# Patient Record
Sex: Female | Born: 1935 | Race: White | Hispanic: No | Smoking: Former smoker
Health system: Southern US, Community
[De-identification: ages and names within clinical notes are randomized; demographics above are authoritative.]

## PROBLEM LIST (undated history)

## (undated) ENCOUNTER — Emergency Department (HOSPITAL_COMMUNITY): Admission: EM | Disposition: A | Discharge status: Home / Self Care | Payer: Medicare Other

## (undated) DIAGNOSIS — R42 Dizziness and giddiness: Secondary | ICD-10-CM

## (undated) DIAGNOSIS — Z860100 Personal history of colon polyps, unspecified: Secondary | ICD-10-CM

## (undated) DIAGNOSIS — I447 Left bundle-branch block, unspecified: Secondary | ICD-10-CM

## (undated) DIAGNOSIS — I739 Peripheral vascular disease, unspecified: Secondary | ICD-10-CM

## (undated) DIAGNOSIS — U071 COVID-19: Secondary | ICD-10-CM

## (undated) DIAGNOSIS — Z95810 Presence of automatic (implantable) cardiac defibrillator: Secondary | ICD-10-CM

## (undated) DIAGNOSIS — I771 Stricture of artery: Secondary | ICD-10-CM

## (undated) DIAGNOSIS — Z95820 Peripheral vascular angioplasty status with implants and grafts: Secondary | ICD-10-CM

## (undated) DIAGNOSIS — F329 Major depressive disorder, single episode, unspecified: Secondary | ICD-10-CM

## (undated) DIAGNOSIS — J40 Bronchitis, not specified as acute or chronic: Secondary | ICD-10-CM

## (undated) DIAGNOSIS — K219 Gastro-esophageal reflux disease without esophagitis: Secondary | ICD-10-CM

## (undated) DIAGNOSIS — J449 Chronic obstructive pulmonary disease, unspecified: Secondary | ICD-10-CM

## (undated) DIAGNOSIS — I219 Acute myocardial infarction, unspecified: Secondary | ICD-10-CM

## (undated) DIAGNOSIS — F0390 Unspecified dementia without behavioral disturbance: Secondary | ICD-10-CM

## (undated) DIAGNOSIS — Z87442 Personal history of urinary calculi: Secondary | ICD-10-CM

## (undated) DIAGNOSIS — E785 Hyperlipidemia, unspecified: Secondary | ICD-10-CM

## (undated) DIAGNOSIS — G4700 Insomnia, unspecified: Secondary | ICD-10-CM

## (undated) DIAGNOSIS — I5042 Chronic combined systolic (congestive) and diastolic (congestive) heart failure: Secondary | ICD-10-CM

## (undated) DIAGNOSIS — I10 Essential (primary) hypertension: Secondary | ICD-10-CM

## (undated) DIAGNOSIS — N183 Chronic kidney disease, stage 3 unspecified: Secondary | ICD-10-CM

## (undated) DIAGNOSIS — M797 Fibromyalgia: Secondary | ICD-10-CM

## (undated) DIAGNOSIS — M1990 Unspecified osteoarthritis, unspecified site: Secondary | ICD-10-CM

## (undated) DIAGNOSIS — F419 Anxiety disorder, unspecified: Secondary | ICD-10-CM

## (undated) DIAGNOSIS — K635 Polyp of colon: Secondary | ICD-10-CM

## (undated) DIAGNOSIS — G8929 Other chronic pain: Secondary | ICD-10-CM

## (undated) DIAGNOSIS — E119 Type 2 diabetes mellitus without complications: Secondary | ICD-10-CM

## (undated) DIAGNOSIS — H269 Unspecified cataract: Secondary | ICD-10-CM

## (undated) DIAGNOSIS — N1832 Chronic kidney disease, stage 3b: Secondary | ICD-10-CM

## (undated) DIAGNOSIS — F418 Other specified anxiety disorders: Secondary | ICD-10-CM

## (undated) DIAGNOSIS — J9621 Acute and chronic respiratory failure with hypoxia: Secondary | ICD-10-CM

## (undated) DIAGNOSIS — N179 Acute kidney failure, unspecified: Secondary | ICD-10-CM

## (undated) DIAGNOSIS — R0600 Dyspnea, unspecified: Secondary | ICD-10-CM

## (undated) DIAGNOSIS — J45909 Unspecified asthma, uncomplicated: Secondary | ICD-10-CM

## (undated) DIAGNOSIS — R55 Syncope and collapse: Secondary | ICD-10-CM

## (undated) DIAGNOSIS — I429 Cardiomyopathy, unspecified: Secondary | ICD-10-CM

## (undated) DIAGNOSIS — I272 Pulmonary hypertension, unspecified: Secondary | ICD-10-CM

## (undated) DIAGNOSIS — K5909 Other constipation: Secondary | ICD-10-CM

## (undated) DIAGNOSIS — Z950 Presence of cardiac pacemaker: Secondary | ICD-10-CM

## (undated) DIAGNOSIS — M549 Dorsalgia, unspecified: Secondary | ICD-10-CM

## (undated) DIAGNOSIS — I2510 Atherosclerotic heart disease of native coronary artery without angina pectoris: Secondary | ICD-10-CM

## (undated) DIAGNOSIS — H9190 Unspecified hearing loss, unspecified ear: Secondary | ICD-10-CM

## (undated) HISTORY — PX: INSERT / REPLACE / REMOVE PACEMAKER: SUR710

## (undated) HISTORY — PX: CORONARY ANGIOPLASTY: SHX604

## (undated) HISTORY — PX: TUBAL LIGATION: SHX77

## (undated) HISTORY — DX: Acute myocardial infarction, unspecified: I21.9

## (undated) HISTORY — DX: Fibromyalgia: M79.7

## (undated) HISTORY — DX: Peripheral vascular disease, unspecified: I73.9

## (undated) HISTORY — DX: Acute kidney failure, unspecified: N17.9

## (undated) HISTORY — DX: Hyperlipidemia, unspecified: E78.5

## (undated) HISTORY — PX: ABDOMINAL HYSTERECTOMY: SHX81

## (undated) HISTORY — DX: Gastro-esophageal reflux disease without esophagitis: K21.9

## (undated) HISTORY — DX: Syncope and collapse: R55

## (undated) HISTORY — PX: TONSILLECTOMY: SUR1361

## (undated) HISTORY — DX: Personal history of colon polyps, unspecified: Z86.0100

## (undated) HISTORY — DX: Chronic obstructive pulmonary disease, unspecified: J44.9

## (undated) HISTORY — DX: Atherosclerotic heart disease of native coronary artery without angina pectoris: I25.10

## (undated) HISTORY — DX: Major depressive disorder, single episode, unspecified: F32.9

## (undated) HISTORY — DX: Essential (primary) hypertension: I10

## (undated) HISTORY — DX: Left bundle-branch block, unspecified: I44.7

## (undated) HISTORY — DX: Insomnia, unspecified: G47.00

## (undated) HISTORY — PX: APPENDECTOMY: SHX54

## (undated) HISTORY — DX: Personal history of colonic polyps: Z86.010

---

## 1997-11-19 ENCOUNTER — Emergency Department (HOSPITAL_COMMUNITY): Admission: EM | Admit: 1997-11-19 | Discharge: 1997-11-19 | Payer: Self-pay | Admitting: Emergency Medicine

## 1998-02-12 ENCOUNTER — Ambulatory Visit (HOSPITAL_COMMUNITY): Admission: RE | Admit: 1998-02-12 | Discharge: 1998-02-12 | Payer: Self-pay | Admitting: *Deleted

## 1999-02-12 ENCOUNTER — Other Ambulatory Visit: Admission: RE | Admit: 1999-02-12 | Discharge: 1999-02-12 | Payer: Self-pay | Admitting: *Deleted

## 2000-04-11 ENCOUNTER — Emergency Department (HOSPITAL_COMMUNITY): Admission: EM | Admit: 2000-04-11 | Discharge: 2000-04-11 | Payer: Self-pay | Admitting: Emergency Medicine

## 2000-06-15 ENCOUNTER — Encounter: Admission: RE | Admit: 2000-06-15 | Discharge: 2000-06-15 | Payer: Self-pay | Admitting: *Deleted

## 2000-11-21 ENCOUNTER — Encounter: Payer: Self-pay | Admitting: Neurosurgery

## 2000-11-21 ENCOUNTER — Ambulatory Visit (HOSPITAL_COMMUNITY): Admission: RE | Admit: 2000-11-21 | Discharge: 2000-11-21 | Payer: Self-pay | Admitting: Neurosurgery

## 2002-01-10 ENCOUNTER — Encounter: Admission: RE | Admit: 2002-01-10 | Discharge: 2002-01-10 | Payer: Self-pay | Admitting: *Deleted

## 2002-02-17 ENCOUNTER — Encounter (INDEPENDENT_AMBULATORY_CARE_PROVIDER_SITE_OTHER): Payer: Self-pay | Admitting: Specialist

## 2002-02-17 ENCOUNTER — Encounter (INDEPENDENT_AMBULATORY_CARE_PROVIDER_SITE_OTHER): Payer: Self-pay | Admitting: *Deleted

## 2002-02-17 ENCOUNTER — Ambulatory Visit (HOSPITAL_COMMUNITY): Admission: RE | Admit: 2002-02-17 | Discharge: 2002-02-17 | Payer: Self-pay | Admitting: *Deleted

## 2002-07-19 ENCOUNTER — Emergency Department (HOSPITAL_COMMUNITY): Admission: EM | Admit: 2002-07-19 | Discharge: 2002-07-19 | Payer: Self-pay | Admitting: Emergency Medicine

## 2002-07-19 ENCOUNTER — Encounter: Payer: Self-pay | Admitting: Emergency Medicine

## 2002-07-29 ENCOUNTER — Encounter: Payer: Self-pay | Admitting: Urology

## 2002-07-29 ENCOUNTER — Encounter: Admission: RE | Admit: 2002-07-29 | Discharge: 2002-07-29 | Payer: Self-pay | Admitting: Urology

## 2002-08-01 ENCOUNTER — Encounter: Payer: Self-pay | Admitting: Urology

## 2002-08-01 ENCOUNTER — Ambulatory Visit (HOSPITAL_BASED_OUTPATIENT_CLINIC_OR_DEPARTMENT_OTHER): Admission: RE | Admit: 2002-08-01 | Discharge: 2002-08-01 | Payer: Self-pay | Admitting: Urology

## 2002-08-18 ENCOUNTER — Encounter: Admission: RE | Admit: 2002-08-18 | Discharge: 2002-08-18 | Payer: Self-pay | Admitting: Family Medicine

## 2002-08-18 ENCOUNTER — Encounter: Payer: Self-pay | Admitting: Family Medicine

## 2003-06-01 ENCOUNTER — Encounter: Admission: RE | Admit: 2003-06-01 | Discharge: 2003-06-01 | Payer: Self-pay | Admitting: Family Medicine

## 2003-11-01 ENCOUNTER — Emergency Department (HOSPITAL_COMMUNITY): Admission: EM | Admit: 2003-11-01 | Discharge: 2003-11-02 | Payer: Self-pay | Admitting: *Deleted

## 2003-11-02 ENCOUNTER — Encounter: Admission: RE | Admit: 2003-11-02 | Discharge: 2003-11-02 | Payer: Self-pay | Admitting: Family Medicine

## 2004-05-15 ENCOUNTER — Encounter: Admission: RE | Admit: 2004-05-15 | Discharge: 2004-05-15 | Payer: Self-pay | Admitting: Cardiovascular Disease

## 2004-05-17 ENCOUNTER — Ambulatory Visit: Payer: Self-pay | Admitting: Family Medicine

## 2004-05-20 ENCOUNTER — Ambulatory Visit (HOSPITAL_COMMUNITY): Admission: RE | Admit: 2004-05-20 | Discharge: 2004-05-20 | Payer: Self-pay | Admitting: Cardiovascular Disease

## 2004-07-24 ENCOUNTER — Ambulatory Visit: Payer: Self-pay | Admitting: Family Medicine

## 2004-08-13 ENCOUNTER — Ambulatory Visit: Payer: Self-pay | Admitting: Family Medicine

## 2004-10-01 ENCOUNTER — Ambulatory Visit: Payer: Self-pay | Admitting: Family Medicine

## 2005-07-10 ENCOUNTER — Ambulatory Visit: Payer: Self-pay | Admitting: Family Medicine

## 2005-09-08 ENCOUNTER — Ambulatory Visit: Payer: Self-pay | Admitting: Family Medicine

## 2005-09-11 ENCOUNTER — Ambulatory Visit: Payer: Self-pay

## 2006-02-11 ENCOUNTER — Ambulatory Visit: Payer: Self-pay | Admitting: Family Medicine

## 2006-09-09 ENCOUNTER — Ambulatory Visit: Payer: Self-pay | Admitting: Family Medicine

## 2006-10-20 ENCOUNTER — Ambulatory Visit: Payer: Self-pay | Admitting: Family Medicine

## 2006-12-09 ENCOUNTER — Ambulatory Visit: Payer: Self-pay | Admitting: Family Medicine

## 2006-12-30 ENCOUNTER — Ambulatory Visit: Payer: Self-pay | Admitting: Internal Medicine

## 2007-01-13 ENCOUNTER — Telehealth: Payer: Self-pay | Admitting: Family Medicine

## 2007-01-15 ENCOUNTER — Ambulatory Visit: Payer: Self-pay | Admitting: Internal Medicine

## 2007-02-01 ENCOUNTER — Telehealth: Payer: Self-pay | Admitting: Family Medicine

## 2007-02-10 ENCOUNTER — Ambulatory Visit: Payer: Self-pay | Admitting: Internal Medicine

## 2007-02-25 DIAGNOSIS — Z860100 Personal history of colon polyps, unspecified: Secondary | ICD-10-CM | POA: Insufficient documentation

## 2007-02-25 DIAGNOSIS — I10 Essential (primary) hypertension: Secondary | ICD-10-CM

## 2007-02-25 DIAGNOSIS — K219 Gastro-esophageal reflux disease without esophagitis: Secondary | ICD-10-CM | POA: Insufficient documentation

## 2007-02-25 DIAGNOSIS — F418 Other specified anxiety disorders: Secondary | ICD-10-CM

## 2007-03-02 ENCOUNTER — Ambulatory Visit: Payer: Self-pay | Admitting: Family Medicine

## 2007-03-02 DIAGNOSIS — J449 Chronic obstructive pulmonary disease, unspecified: Secondary | ICD-10-CM | POA: Insufficient documentation

## 2007-03-04 LAB — CONVERTED CEMR LAB
BUN: 9 mg/dL (ref 6–23)
CO2: 32 meq/L (ref 19–32)
Chloride: 103 meq/L (ref 96–112)
GFR calc Af Amer: 80 mL/min
Glucose, Bld: 112 mg/dL — ABNORMAL HIGH (ref 70–99)
Potassium: 4.5 meq/L (ref 3.5–5.1)

## 2007-04-08 ENCOUNTER — Encounter: Payer: Self-pay | Admitting: Family Medicine

## 2007-04-13 ENCOUNTER — Encounter: Payer: Self-pay | Admitting: Family Medicine

## 2007-05-06 ENCOUNTER — Encounter: Payer: Self-pay | Admitting: Internal Medicine

## 2007-05-21 ENCOUNTER — Telehealth (INDEPENDENT_AMBULATORY_CARE_PROVIDER_SITE_OTHER): Payer: Self-pay | Admitting: *Deleted

## 2007-06-04 ENCOUNTER — Ambulatory Visit: Payer: Self-pay | Admitting: Internal Medicine

## 2007-06-04 DIAGNOSIS — R635 Abnormal weight gain: Secondary | ICD-10-CM | POA: Insufficient documentation

## 2007-06-07 LAB — CONVERTED CEMR LAB
Chloride: 103 meq/L (ref 96–112)
Creatinine, Ser: 1 mg/dL (ref 0.4–1.2)
GFR calc Af Amer: 71 mL/min
Glucose, Bld: 101 mg/dL — ABNORMAL HIGH (ref 70–99)
Potassium: 3.8 meq/L (ref 3.5–5.1)
Pro B Natriuretic peptide (BNP): 178 pg/mL — ABNORMAL HIGH (ref 0.0–100.0)
Sed Rate: 11 mm/hr (ref 0–25)
Sodium: 142 meq/L (ref 135–145)

## 2007-06-21 ENCOUNTER — Telehealth: Payer: Self-pay | Admitting: Family Medicine

## 2007-07-28 ENCOUNTER — Ambulatory Visit: Payer: Self-pay | Admitting: Family Medicine

## 2007-07-28 DIAGNOSIS — E785 Hyperlipidemia, unspecified: Secondary | ICD-10-CM

## 2007-08-06 ENCOUNTER — Telehealth: Payer: Self-pay | Admitting: Family Medicine

## 2007-08-09 ENCOUNTER — Telehealth: Payer: Self-pay | Admitting: Family Medicine

## 2007-08-31 ENCOUNTER — Telehealth: Payer: Self-pay | Admitting: Family Medicine

## 2007-11-05 ENCOUNTER — Encounter: Payer: Self-pay | Admitting: Family Medicine

## 2007-11-26 ENCOUNTER — Ambulatory Visit: Payer: Self-pay | Admitting: Family Medicine

## 2007-11-26 DIAGNOSIS — IMO0001 Reserved for inherently not codable concepts without codable children: Secondary | ICD-10-CM

## 2007-12-01 ENCOUNTER — Inpatient Hospital Stay (HOSPITAL_COMMUNITY): Admission: EM | Admit: 2007-12-01 | Discharge: 2007-12-03 | Payer: Self-pay | Admitting: Emergency Medicine

## 2007-12-01 ENCOUNTER — Ambulatory Visit: Payer: Self-pay | Admitting: Internal Medicine

## 2007-12-01 HISTORY — PX: CARDIAC CATHETERIZATION: SHX172

## 2007-12-03 ENCOUNTER — Encounter: Payer: Self-pay | Admitting: Internal Medicine

## 2007-12-13 ENCOUNTER — Encounter: Payer: Self-pay | Admitting: Family Medicine

## 2007-12-15 ENCOUNTER — Telehealth: Payer: Self-pay | Admitting: Family Medicine

## 2008-02-14 ENCOUNTER — Telehealth: Payer: Self-pay | Admitting: Family Medicine

## 2008-02-24 ENCOUNTER — Encounter: Payer: Self-pay | Admitting: Family Medicine

## 2008-02-29 ENCOUNTER — Encounter: Payer: Self-pay | Admitting: Family Medicine

## 2008-03-16 ENCOUNTER — Encounter: Payer: Self-pay | Admitting: Family Medicine

## 2008-03-27 ENCOUNTER — Encounter: Payer: Self-pay | Admitting: Family Medicine

## 2008-03-30 DIAGNOSIS — I428 Other cardiomyopathies: Secondary | ICD-10-CM | POA: Insufficient documentation

## 2008-03-30 DIAGNOSIS — I2510 Atherosclerotic heart disease of native coronary artery without angina pectoris: Secondary | ICD-10-CM

## 2008-04-24 ENCOUNTER — Encounter: Payer: Self-pay | Admitting: Family Medicine

## 2008-05-16 HISTORY — PX: CARDIAC DEFIBRILLATOR PLACEMENT: SHX171

## 2008-05-18 ENCOUNTER — Encounter: Payer: Self-pay | Admitting: Family Medicine

## 2008-05-31 ENCOUNTER — Encounter: Payer: Self-pay | Admitting: Family Medicine

## 2008-06-15 ENCOUNTER — Telehealth: Payer: Self-pay | Admitting: Family Medicine

## 2008-06-21 ENCOUNTER — Ambulatory Visit: Payer: Self-pay | Admitting: Family Medicine

## 2008-06-21 ENCOUNTER — Telehealth: Payer: Self-pay | Admitting: Family Medicine

## 2008-06-21 DIAGNOSIS — M25559 Pain in unspecified hip: Secondary | ICD-10-CM | POA: Insufficient documentation

## 2008-08-04 ENCOUNTER — Ambulatory Visit: Payer: Self-pay | Admitting: Family Medicine

## 2008-09-19 ENCOUNTER — Telehealth: Payer: Self-pay | Admitting: Family Medicine

## 2008-09-26 ENCOUNTER — Encounter: Payer: Self-pay | Admitting: Family Medicine

## 2008-10-10 ENCOUNTER — Encounter: Payer: Self-pay | Admitting: Family Medicine

## 2008-10-11 ENCOUNTER — Ambulatory Visit: Payer: Self-pay | Admitting: Family Medicine

## 2008-11-10 ENCOUNTER — Ambulatory Visit: Payer: Self-pay | Admitting: Family Medicine

## 2009-01-23 ENCOUNTER — Ambulatory Visit: Payer: Self-pay | Admitting: Family Medicine

## 2009-01-23 DIAGNOSIS — G4700 Insomnia, unspecified: Secondary | ICD-10-CM | POA: Insufficient documentation

## 2009-01-25 ENCOUNTER — Telehealth: Payer: Self-pay | Admitting: Family Medicine

## 2009-04-02 ENCOUNTER — Telehealth: Payer: Self-pay | Admitting: Family Medicine

## 2009-04-02 ENCOUNTER — Ambulatory Visit: Payer: Self-pay | Admitting: Family Medicine

## 2009-04-30 ENCOUNTER — Telehealth: Payer: Self-pay | Admitting: Family Medicine

## 2009-06-19 ENCOUNTER — Telehealth: Payer: Self-pay | Admitting: Family Medicine

## 2009-06-29 ENCOUNTER — Ambulatory Visit: Payer: Self-pay | Admitting: Family Medicine

## 2009-07-03 LAB — CONVERTED CEMR LAB
ALT: 13 units/L (ref 0–35)
AST: 16 units/L (ref 0–37)
Albumin: 3.9 g/dL (ref 3.5–5.2)
BUN: 15 mg/dL (ref 6–23)
CO2: 19 meq/L (ref 19–32)
Calcium: 9.3 mg/dL (ref 8.4–10.5)
Chloride: 106 meq/L (ref 96–112)
Creatinine, Ser: 0.72 mg/dL (ref 0.40–1.20)
Eosinophils Absolute: 0.2 10*3/uL (ref 0.0–0.7)
HCT: 41.5 % (ref 36.0–46.0)
Lymphocytes Relative: 26.5 % (ref 12.0–46.0)
Monocytes Relative: 8.6 % (ref 3.0–12.0)
RDW: 13.2 % (ref 11.5–14.6)
WBC: 6.2 10*3/uL (ref 4.5–10.5)

## 2009-07-04 ENCOUNTER — Encounter: Payer: Self-pay | Admitting: Family Medicine

## 2009-08-07 ENCOUNTER — Telehealth: Payer: Self-pay | Admitting: Family Medicine

## 2009-08-28 ENCOUNTER — Telehealth: Payer: Self-pay | Admitting: Family Medicine

## 2009-09-25 ENCOUNTER — Telehealth: Payer: Self-pay | Admitting: Family Medicine

## 2009-10-02 ENCOUNTER — Telehealth: Payer: Self-pay | Admitting: Family Medicine

## 2009-10-29 ENCOUNTER — Encounter: Payer: Self-pay | Admitting: Family Medicine

## 2009-10-29 ENCOUNTER — Emergency Department (HOSPITAL_COMMUNITY): Admission: EM | Admit: 2009-10-29 | Discharge: 2009-10-29 | Payer: Self-pay | Admitting: Emergency Medicine

## 2009-11-06 ENCOUNTER — Ambulatory Visit: Payer: Self-pay | Admitting: Family Medicine

## 2009-11-08 ENCOUNTER — Telehealth: Payer: Self-pay | Admitting: Family Medicine

## 2009-11-29 ENCOUNTER — Telehealth: Payer: Self-pay | Admitting: Family Medicine

## 2009-12-06 ENCOUNTER — Telehealth: Payer: Self-pay | Admitting: Family Medicine

## 2010-01-01 ENCOUNTER — Ambulatory Visit: Payer: Self-pay | Admitting: Family Medicine

## 2010-01-01 LAB — CONVERTED CEMR LAB
Bilirubin Urine: NEGATIVE
Blood in Urine, dipstick: NEGATIVE
Glucose, Urine, Semiquant: NEGATIVE
Nitrite: NEGATIVE

## 2010-01-02 ENCOUNTER — Encounter: Payer: Self-pay | Admitting: Family Medicine

## 2010-01-04 ENCOUNTER — Telehealth: Payer: Self-pay

## 2010-01-14 ENCOUNTER — Ambulatory Visit: Payer: Self-pay | Admitting: Family Medicine

## 2010-01-14 DIAGNOSIS — K5909 Other constipation: Secondary | ICD-10-CM

## 2010-01-16 ENCOUNTER — Encounter: Payer: Self-pay | Admitting: Gastroenterology

## 2010-01-22 ENCOUNTER — Telehealth: Payer: Self-pay | Admitting: Family Medicine

## 2010-01-25 ENCOUNTER — Ambulatory Visit: Payer: Self-pay | Admitting: Family Medicine

## 2010-01-25 ENCOUNTER — Ambulatory Visit: Payer: Self-pay | Admitting: Internal Medicine

## 2010-01-28 ENCOUNTER — Encounter: Payer: Self-pay | Admitting: Family Medicine

## 2010-01-28 ENCOUNTER — Telehealth: Payer: Self-pay | Admitting: Family Medicine

## 2010-01-28 DIAGNOSIS — M4800 Spinal stenosis, site unspecified: Secondary | ICD-10-CM

## 2010-02-01 ENCOUNTER — Encounter: Payer: Self-pay | Admitting: Family Medicine

## 2010-02-11 ENCOUNTER — Telehealth: Payer: Self-pay | Admitting: Family Medicine

## 2010-02-12 ENCOUNTER — Encounter: Admission: RE | Admit: 2010-02-12 | Discharge: 2010-02-12 | Payer: Self-pay | Admitting: Neurosurgery

## 2010-02-14 ENCOUNTER — Telehealth: Payer: Self-pay | Admitting: Family Medicine

## 2010-02-26 ENCOUNTER — Encounter: Payer: Self-pay | Admitting: Family Medicine

## 2010-03-06 ENCOUNTER — Ambulatory Visit: Payer: Self-pay | Admitting: Internal Medicine

## 2010-03-06 ENCOUNTER — Inpatient Hospital Stay (HOSPITAL_COMMUNITY): Admission: RE | Admit: 2010-03-06 | Discharge: 2010-03-15 | Payer: Self-pay | Admitting: Neurosurgery

## 2010-03-20 ENCOUNTER — Telehealth: Payer: Self-pay | Admitting: Family Medicine

## 2010-03-27 ENCOUNTER — Telehealth: Payer: Self-pay | Admitting: Family Medicine

## 2010-04-11 ENCOUNTER — Encounter: Payer: Self-pay | Admitting: Family Medicine

## 2010-05-22 ENCOUNTER — Encounter: Payer: Self-pay | Admitting: Family Medicine

## 2010-06-16 HISTORY — PX: BACK SURGERY: SHX140

## 2010-06-28 ENCOUNTER — Telehealth: Payer: Self-pay | Admitting: Family Medicine

## 2010-07-03 ENCOUNTER — Telehealth: Payer: Self-pay | Admitting: Family Medicine

## 2010-07-11 ENCOUNTER — Ambulatory Visit: Admit: 2010-07-11 | Payer: Self-pay | Admitting: Family Medicine

## 2010-07-18 NOTE — Progress Notes (Signed)
Summary: please advise  Phone Note Call from Patient Call back at Home Phone [phone]   Caller: Patient--live call Summary of Call: was seen last Tuesday and was given MIralax. Now, she is having lower body numbness x 4 days. Please advise. Initial call taken by: Warnell Forester,  January 22, 2010 4:36 PM  Follow-up for Phone Call        this would not be from the Miralax, so continue on this. If the numbness continues, see me for some testing Follow-up by: Nelwyn Salisbury MD,  January 22, 2010 5:38 PM  Additional Follow-up for Phone Call Additional follow up Details #1::        Phone Call Completed Additional Follow-up by: Raechel Ache, RN,  January 22, 2010 5:40 PM

## 2010-07-18 NOTE — Progress Notes (Signed)
Summary: INFO ONLY  Phone Note Call from Patient   Caller: Patient  [phone] Reason for Call: Talk to Doctor Summary of Call: Pt called to speak with Dr Clent Ridges... Marland KitchenPt wants to adv Dr Clent Ridges that she stopped taking her med, Cymbalta and now the "swishy" feeling she was exp is gone but she is still going to have the doppler study done at [facility] tomorrow...Marland Kitchen??   Pt was adv that info would be passed along to Dr Clent Ridges.  Initial call taken by: Debbra Riding,  Nov 08, 2009 11:12 AM  Follow-up for Phone Call        noted Follow-up by: Nelwyn Salisbury MD,  Nov 08, 2009 11:22 AM

## 2010-07-18 NOTE — Progress Notes (Signed)
Summary: samples needed  Phone Note Call from Patient Call back at Home Phone [phone]   Caller: Patient---live call Summary of Call: requesting more cymbalta samples. Took last one today. Initial call taken by: Warnell Forester,  March 20, 2010 10:55 AM  Follow-up for Phone Call        no samples  does she want rx called in if so, where  Follow-up by: Pura Spice, RN,  March 20, 2010 12:50 PM  Additional Follow-up for Phone Call Additional follow up Details #1::        no samples available. She already has a rx she can fill  Additional Follow-up by: Nelwyn Salisbury MD,  March 20, 2010 2:23 PM    Additional Follow-up for Phone Call Additional follow up Details #2::    Pt informed Follow-up by: Sid Falcon LPN,  March 20, 2010 4:29 PM

## 2010-07-18 NOTE — Letter (Signed)
Summary: [facility]  [facility]   Imported By: Maryln Gottron 07/12/2009 12:47:23  _____________________________________________________________________  External Attachment:    Type:   Image     Comment:   External Document

## 2010-07-18 NOTE — Progress Notes (Signed)
Summary: Pt req samples of Cimbalta 60mg   Phone Note Call from Patient Call back at Home Phone [phone]   Caller: Patient Summary of Call: Pt is req samples of Cimbalta 60mg .    Please call pt when ready for pick up.    Initial call taken by: Lucy Antigua,  August 28, 2009 2:28 PM  Follow-up for Phone Call        please find her some samples Follow-up by: Nelwyn Salisbury MD,  August 28, 2009 4:52 PM  Additional Follow-up for Phone Call Additional follow up Details #1::        Phone Call Completed Additional Follow-up by: Raechel Ache, RN,  August 28, 2009 5:04 PM

## 2010-07-18 NOTE — Assessment & Plan Note (Signed)
Summary: BACK / LEG PAIN // RS   Vital Signs:  Patient profile:   75 year old female Weight:      193 pounds Temp:     97.9 degrees F oral Pulse rate:   91 / minute BP sitting:   130 / 70  (left arm) Cuff size:   regular  Vitals Entered By: Kathrynn Speed CMA (January 14, 2010 4:03 PM) CC: back & leg pain, for one week,  having problems with bowl movements,  one year, src   CC:  back & leg pain, for one week, having problems with bowl movements, one year, and src.  History of Present Illness: Here to follow up on low back pain. We saw her several weeks ago, and ruled out a UTI with a negative culture. She still has the pain. but she thinks it may be from her bowels. She tends to have chronic constipation, and sometimes it is very difficult to pass a stool. Her last colonoscopy was at least 5 years ago, and she is interested in getting another one. There has been no blood in the stool. She declines a rectal exam today.   Current Medications (verified): 1)  Furosemide 80 Mg  Tabs (Furosemide) .... Twice Daily 2)  Adult Aspirin Low Strength 81 Mg  Tbdp (Aspirin) .Marland Kitchen.. 1 By Mouth Once Daily 3)  Plavix 75 Mg  Tabs (Clopidogrel Bisulfate) .Marland Kitchen.. 1 By Mouth Once Daily 4)  Klor-Con M20 20 Meq Tbcr (Potassium Chloride Crys Cr) .... Two Times A Day 5)  Lisinopril 10 Mg  Tabs (Lisinopril) .... 2 Two Times A Day 6)  Xanax 1 Mg Tabs (Alprazolam) .... 4 Times A Day 7)  Cymbalta 60 Mg  Cpep (Duloxetine Hcl) .Marland Kitchen.. 1 By Mouth Two Times A Day 8)  Isosorbide Dinitrate 30 Mg  Tabs (Isosorbide Dinitrate) .Marland Kitchen.. 1 By Mouth Once Daily 9)  Temazepam 30 Mg Caps (Temazepam) .... At Bedtime 10)  Toprol Xl 25 Mg Xr24h-Tab (Metoprolol Succinate) .Marland Kitchen.. 1 Once Daily 11)  Vicodin Hp 10-660 Mg Tabs (Hydrocodone-Acetaminophen) .Marland Kitchen.. 1 Q 6 Hours As Needed Pain  Allergies (verified): 1)  ! Pcn 2)  ! Darvocet 3)  ! Codeine 4)  ! Vioxx 5)  ! * Mobic  Past History:  Past Medical History: Reviewed history from 11/06/2009  and no changes required. COPD (ICD-496) HYPERTENSION (ICD-401.9) GERD (ICD-530.81) DEPRESSION (ICD-311) COLONIC POLYPS, HX OF (ICD-V12.72) insomnia fibromyalgia Hyperlipidemia Coronary artery disease, sees Dr. Nanetta Batty. ECHO in 09-2008 showed EF of 40-45% mild carotid artery disease  Past Surgical History: Reviewed history from 11/06/2009 and no changes required. Hysterectomy Appendectomy Tonsillectomy Tubal ligation internal converter-defibrillator placed 12-09, followed by Dr. Fredrich Birks  Review of Systems  The patient denies anorexia, fever, weight loss, weight gain, vision loss, decreased hearing, hoarseness, chest pain, syncope, dyspnea on exertion, peripheral edema, prolonged cough, headaches, hemoptysis, abdominal pain, melena, hematochezia, severe indigestion/heartburn, hematuria, incontinence, genital sores, muscle weakness, suspicious skin lesions, transient blindness, difficulty walking, depression, unusual weight change, abnormal bleeding, enlarged lymph nodes, angioedema, breast masses, and testicular masses.    Physical Exam  General:  Well-developed,well-nourished,in no acute distress; alert,appropriate and cooperative throughout examination Lungs:  Normal respiratory effort, chest expands symmetrically. Lungs are clear to auscultation, no crackles or wheezes. Heart:  Normal rate and regular rhythm. S1 and S2 normal without gallop, murmur, click, rub or other extra sounds. Abdomen:  Bowel sounds positive,abdomen soft and non-tender without masses, organomegaly or hernias noted.   Impression & Recommendations:  Problem #  1:  CONSTIPATION (ICD-564.00)  Her updated medication list for this problem includes:    Miralax Powd (Polyethylene glycol 3350) ..... Use two times a day  Problem # 2:  BACK PAIN, LUMBAR (ICD-724.2)  Her updated medication list for this problem includes:    Adult Aspirin Low Strength 81 Mg Tbdp (Aspirin) .Marland Kitchen... 1 by mouth once daily     Vicodin Hp 10-660 Mg Tabs (Hydrocodone-acetaminophen) .Marland Kitchen... 1 q 6 hours as needed pain  Problem # 3:  COLONIC POLYPS, HX OF (ICD-V12.72)  Orders: Gastroenterology Referral (GI)  Complete Medication List: 1)  Furosemide 80 Mg Tabs (Furosemide) .... Twice daily 2)  Adult Aspirin Low Strength 81 Mg Tbdp (Aspirin) .Marland Kitchen.. 1 by mouth once daily 3)  Plavix 75 Mg Tabs (Clopidogrel bisulfate) .Marland Kitchen.. 1 by mouth once daily 4)  Klor-con M20 20 Meq Tbcr (Potassium chloride crys cr) .... Two times a day 5)  Lisinopril 10 Mg Tabs (Lisinopril) .... 2 two times a day 6)  Xanax 1 Mg Tabs (Alprazolam) .... 4 times a day 7)  Cymbalta 60 Mg Cpep (Duloxetine hcl) .Marland Kitchen.. 1 by mouth two times a day 8)  Isosorbide Dinitrate 30 Mg Tabs (Isosorbide dinitrate) .Marland Kitchen.. 1 by mouth once daily 9)  Temazepam 30 Mg Caps (Temazepam) .... At bedtime 10)  Toprol Xl 25 Mg Xr24h-tab (Metoprolol succinate) .Marland Kitchen.. 1 once daily 11)  Vicodin Hp 10-660 Mg Tabs (Hydrocodone-acetaminophen) .Marland Kitchen.. 1 q 6 hours as needed pain 12)  Miralax Powd (Polyethylene glycol 3350) .... Use two times a day  Patient Instructions: 1)  Certainly constipation can be a side effect of a lot of her medications. Suggested she take Miralax two times a day and drink plenty of water. We will set up a colonoscopy soon.

## 2010-07-18 NOTE — Progress Notes (Signed)
Summary: cymbalta samples  Phone Note Call from Patient Call back at Home Phone [phone]   Caller: Patient Call For: Nelwyn Salisbury MD Summary of Call: please needs cymbalta samples Initial call taken by: Heron Sabins,  March 27, 2010 4:06 PM  Follow-up for Phone Call        done, on your desk Follow-up by: Nelwyn Salisbury MD,  March 27, 2010 4:59 PM  Additional Follow-up for Phone Call Additional follow up Details #1::        pt aware,  Additional Follow-up by: Pura Spice, RN,  March 27, 2010 5:01 PM

## 2010-07-18 NOTE — Procedures (Signed)
Summary: EGD   EGD  Procedure date:  02/17/2002  Findings:      Location: [facility]     NAME:  Sydney Phillips, Sydney Phillips                       ACCOUNT NO.:  [id number]   MEDICAL RECORD NO.:  [id number]                   PATIENT TYPE:  AMB   LOCATION:  ENDO                                 FACILITY:  [facility]   PHYSICIAN:  Georgiana Spinner, M.D.                 DATE OF BIRTH:  20-Feb-1936   DATE OF PROCEDURE:  02/17/2002  DATE OF DISCHARGE:                                 OPERATIVE REPORT   PREOPERATIVE DIAGNOSIS:  Upper endoscopy.   INDICATIONS:  Gastroesophageal reflux disease.   ANESTHESIA:  Demerol 100 and Versed 8 mg.   PROCEDURE:  With the patient mildly sedated, in the left lateral decubitus  position, the Olympus videoscopic endoscope was inserted into the mouth and  passed under direct vision through the esophagus which appeared normal into  the stomach.  The fundus, body, antrum, duodenal bulb and second portion of  the duodenum all appeared normal.  From this point the endoscope was slowly  withdrawn taking circumflex views of the entire duodenal mucosa.  The  endoscope was then pulled back into the stomach and placed in retroflex to  review the stomach from below.  The endoscope was then straightened and  withdrawn taking circumflex views of the remaining gastric and esophageal  mucosa.  The patient's vital signs and pulse oximetry remained stable.  The  patient tolerated the procedure well without apparent complications.   FINDINGS:  This is an unremarkable endoscopic examination.   PLAN:  Proceed to colonoscopy.                                               Georgiana Spinner, M.D.    GMO/MEDQ  D:  02/17/2002  T:  02/18/2002  Job:  [id number]   cc:   Lacretia Leigh. Quintella Reichert, M.D.

## 2010-07-18 NOTE — Letter (Signed)
Summary: New Patient letter  [facility]  [address] [place], [place] [address]   Phone: [phone]  Fax: [phone]       01/16/2010 MRN: [id number]  [facility] [address] [place], [place]  [address]  Dear Ms. Rockhill,  Welcome to the Gastroenterology Division at [facility].    You are scheduled to see Dr.  Jarold Motto on 02-28-10 at 10AM on the 3rd floor at [facility], [address]  We ask that you try to arrive at our office 15 minutes prior to your appointment time to allow for check-in.  We would like you to complete the enclosed self-administered evaluation form prior to your visit and bring it with you on the day of your appointment.  We will review it with you.  Also, please bring a complete list of all your medications or, if you prefer, bring the medication bottles and we will list them.  Please bring your insurance card so that we may make a copy of it.  If your insurance requires a referral to see a specialist, please bring your referral form from your primary care physician.  Co-payments are due at the time of your visit and may be paid by cash, check or credit card.     Your office visit will consist of a consult with your physician (includes a physical exam), any laboratory testing he/she may order, scheduling of any necessary diagnostic testing (e.g. x-ray, ultrasound, CT-scan), and scheduling of a procedure (e.g. Endoscopy, Colonoscopy) if required.  Please allow enough time on your schedule to allow for any/all of these possibilities.    If you cannot keep your appointment, please call [phone] to cancel or reschedule prior to your appointment date.  This allows [place] the opportunity to schedule an appointment for another patient in need of care.  If you do not cancel or reschedule by 5 p.m. the business day prior to your appointment date, you will be charged a $50.00 late cancellation/no-show fee.    Thank you for  choosing [facility] for your medical needs.  We appreciate the opportunity to care for you.  Please visit [place] at our website  to learn more about our practice.                     Sincerely,                                                             The Gastroenterology Division

## 2010-07-18 NOTE — Progress Notes (Signed)
Summary: return call on lab  Phone Note Call from Patient   Caller: Patient Call For: Sydney Phillips Summary of Call: returning judy's call r/t labs   can be reached at [phone] Initial call taken by: Duard Brady LPN,  January 04, 2010 2:06 PM  Follow-up for Phone Call        called pt - informed lab neg. KIK Follow-up by: Duard Brady LPN,  January 04, 2010 2:08 PM

## 2010-07-18 NOTE — Progress Notes (Signed)
Summary: refill hydrocodone  Phone Note From Pharmacy   Caller: [facility]  [address] [phone]* Call For: fry  Summary of Call: refill hydrocodone 5/500 1 by mouth three times a day as needed for pain Initial call taken by: Alfred Levins, CMA,  August 07, 2009 1:38 PM  Follow-up for Phone Call        call in #90 with 5 rf Follow-up by: Nelwyn Salisbury MD,  August 07, 2009 3:16 PM  Additional Follow-up for Phone Call Additional follow up Details #1::        Phone call completed, Pharmacist called Additional Follow-up by: Alfred Levins, CMA,  August 07, 2009 3:26 PM    Prescriptions: VICODIN 5-500 MG TABS (HYDROCODONE-ACETAMINOPHEN) 1 q 6 hours as needed pain  #90 x 5   Entered by:   Alfred Levins, CMA   Authorized by:   Nelwyn Salisbury MD   Signed by:   Alfred Levins, CMA on 08/07/2009   Method used:   Telephoned to ...       [facility]  [address] [phone]* (retail)       [address]       [place], [place]  [address]       Ph: [phone]       Fax: [phone]   RxID:   [id number]

## 2010-07-18 NOTE — Progress Notes (Signed)
Summary: MORE SAMPLES  Phone Note Call from Patient Call back at Home Phone [phone]   Caller: Patient Call For: Nelwyn Salisbury MD Summary of Call: PT NEEDS MORE SAMPLES OF CYMBALTA PLEASE Initial call taken by: Heron Sabins,  September 25, 2009 3:26 PM  Follow-up for Phone Call        please get her some Follow-up by: Nelwyn Salisbury MD,  September 25, 2009 4:46 PM

## 2010-07-18 NOTE — Progress Notes (Signed)
Summary: refill  Phone Note Refill Request Call back at Home Phone [phone] Message from:  Patient--live call  Refills Requested: Medication #1:  XANAX 1 MG TABS 4 times a day [facility] to [facility] [address].  Initial call taken by: Warnell Forester,  June 28, 2010 4:18 PM  Follow-up for Phone Call        call in #120 with 5 rf Follow-up by: Nelwyn Salisbury MD,  June 28, 2010 5:13 PM  Additional Follow-up for Phone Call Additional follow up Details #1::        done  pt aware  Additional Follow-up by: Pura Spice, RN,  June 28, 2010 5:32 PM    New/Updated Medications: XANAX 1 MG TABS (ALPRAZOLAM) 4 times a day Prescriptions: XANAX 1 MG TABS (ALPRAZOLAM) 4 times a day  #120 x 5   Entered by:   Pura Spice, RN   Authorized by:   Nelwyn Salisbury MD   Signed by:   Pura Spice, RN on 06/28/2010   Method used:   Telephoned to ...       [facility]  [address] [phone]* (retail)       [address]       [place], [place]  [address]       Ph: [phone]       Fax: [phone]   RxID:   [id number]

## 2010-07-18 NOTE — Progress Notes (Signed)
Summary: refill Alprazolam  Phone Note Refill Request Message from:  Fax from Pharmacy on November 29, 2009 11:05 AM  Refills Requested: Medication #1:  XANAX 1 MG TABS 4 times a day   Dosage confirmed as above?Dosage Confirmed   Supply Requested: 1 month   Last Refilled: 10/26/2009  Method Requested: Fax to Local Pharmacy Initial call taken by: Raechel Ache, RN,  November 29, 2009 11:06 AM Caller: [address] [phone]*  Follow-up for Phone Call        call in #120 with 5 rf Follow-up by: Nelwyn Salisbury MD,  November 29, 2009 12:03 PM  Additional Follow-up for Phone Call Additional follow up Details #1::        Rx faxed to pharmacy Additional Follow-up by: Raechel Ache, RN,  November 29, 2009 12:21 PM    Prescriptions: Prudy Feeler 1 MG TABS (ALPRAZOLAM) 4 times a day  #120 x 5   Entered by:   Raechel Ache, RN   Authorized by:   Nelwyn Salisbury MD   Signed by:   Raechel Ache, RN on 11/29/2009   Method used:   Historical   RxID:   [id number]

## 2010-07-18 NOTE — Progress Notes (Signed)
Summary: cymbalta  Phone Note Call from Patient   Caller: Patient Call For: Nelwyn Salisbury MD Summary of Call: Pt. needs samples of Cymbalta. [phone] Initial call taken by: Lynann Beaver CMA,  June 19, 2009 1:56 PM  Follow-up for Phone Call        samples up front ready for p/u, pt aware Follow-up by: Alfred Levins, CMA,  June 19, 2009 3:45 PM

## 2010-07-18 NOTE — Letter (Signed)
Summary: [facility]  [facility]   Imported By: Maryln Gottron 02/11/2010 14:24:04  _____________________________________________________________________  External Attachment:    Type:   Image     Comment:   External Document

## 2010-07-18 NOTE — Progress Notes (Signed)
Summary: Pt req samples for Cymbalta 60mg   Phone Note Call from Patient Call back at Home Phone [phone]   Caller: Patient Summary of Call: Pt is out of Cymbalta 60mg  samples. Pls call when ready for pick up.  Initial call taken by: Lucy Antigua,  February 11, 2010 10:12 AM  Follow-up for Phone Call        we don't have any. I suggest she go online and get an application for free meds from the drug company Follow-up by: Nelwyn Salisbury MD,  February 11, 2010 1:14 PM  Additional Follow-up for Phone Call Additional follow up Details #1::        called. Additional Follow-up by: Raechel Ache, RN,  February 11, 2010 1:21 PM    Prescriptions: CYMBALTA 60 MG  CPEP (DULOXETINE HCL) 1 by mouth two times a day  #180 x 3   Entered by:   Raechel Ache, RN   Authorized by:   Nelwyn Salisbury MD   Signed by:   Raechel Ache, RN on 02/11/2010   Method used:   Electronically to        [facility]* (retail)       [address] [place], [place]  [address]       Ph: [phone]       Fax: [phone]   RxID:   [id number]

## 2010-07-18 NOTE — Progress Notes (Signed)
Summary: refill Temazepam  Phone Note Refill Request Message from:  Fax from Pharmacy on October 02, 2009 11:10 AM  Refills Requested: Medication #1:  TEMAZEPAM 30 MG CAPS at bedtime.   Dosage confirmed as above?Dosage Confirmed   Supply Requested: 1 month   Last Refilled: 02/02/2009  Method Requested: Telephone to Pharmacy Initial call taken by: Raechel Ache, RN,  October 02, 2009 11:12 AM Caller: [address] [phone]*  Follow-up for Phone Call        call in #30 with 5 rf Follow-up by: Nelwyn Salisbury MD,  October 02, 2009 2:05 PM  Additional Follow-up for Phone Call Additional follow up Details #1::        Rx called to pharmacy Additional Follow-up by: Raechel Ache, RN,  October 02, 2009 3:19 PM    Prescriptions: TEMAZEPAM 30 MG CAPS (TEMAZEPAM) at bedtime  #30 x 5   Entered by:   Raechel Ache, RN   Authorized by:   Nelwyn Salisbury MD   Signed by:   Raechel Ache, RN on 10/02/2009   Method used:   Historical   RxID:   [id number]

## 2010-07-18 NOTE — Progress Notes (Signed)
Summary: phone- Sydney Phillips/   medical POA  Phone Note Call from Patient   Caller: Patient Summary of Call: ok to give medical information to son, Hildred Priest- he has medical POA Initial call taken by: Raechel Ache, RN,  January 28, 2010 1:32 PM

## 2010-07-18 NOTE — Assessment & Plan Note (Signed)
Summary: dizziness x 1 month//ccm   Vital Signs:  Patient profile:   75 year old female Weight:      190 pounds BMI:     30.78 Temp:     97.9 degrees F oral Pulse rate:   88 / minute Pulse rhythm:   regular BP sitting:   138 / 80  (right arm) Cuff size:   regular  Vitals Entered By: Raechel Ache, RN (Nov 06, 2009 11:00 AM) CC: C/o funny feeling in head, dizzy & off-balance.   History of Present Illness: Here for several weeks of intermittent lightheadedness and mild nausea. No SOB or chest pain or HAs.  She has been relying on samples of Cymbalta due to cost reasons, and she ran out about 2 weeks ago. About one week ago she was in the ER after she feel out of a chair and bumped her head on the refidgerator. At that time all her labs were normal as was a head CT. She is due to see Dr. Allyson Sabal again in several weeks.   Allergies: 1)  ! Pcn 2)  ! Darvocet 3)  ! Codeine 4)  ! Vioxx 5)  ! * Mobic  Past History:  Past Medical History: COPD (ICD-496) HYPERTENSION (ICD-401.9) GERD (ICD-530.81) DEPRESSION (ICD-311) COLONIC POLYPS, HX OF (ICD-V12.72) insomnia fibromyalgia Hyperlipidemia Coronary artery disease, sees Dr. Nanetta Batty. ECHO in 09-2008 showed EF of 40-45% mild carotid artery disease  Past Surgical History: Hysterectomy Appendectomy Tonsillectomy Tubal ligation internal converter-defibrillator placed 12-09, followed by Dr. Fredrich Birks  Review of Systems  The patient denies anorexia, fever, weight loss, weight gain, vision loss, decreased hearing, hoarseness, chest pain, syncope, dyspnea on exertion, peripheral edema, prolonged cough, headaches, hemoptysis, abdominal pain, melena, hematochezia, severe indigestion/heartburn, hematuria, incontinence, genital sores, muscle weakness, suspicious skin lesions, transient blindness, difficulty walking, depression, unusual weight change, abnormal bleeding, enlarged lymph nodes, angioedema, breast masses, and testicular  masses.    Physical Exam  General:  alert, walks with a walker Head:  Normocephalic and atraumatic without obvious abnormalities. No apparent alopecia or balding. Eyes:  No corneal or conjunctival inflammation noted. EOMI. Perrla. Funduscopic exam benign, without hemorrhages, exudates or papilledema. Vision grossly normal. Ears:  External ear exam shows no significant lesions or deformities.  Otoscopic examination reveals clear canals, tympanic membranes are intact bilaterally without bulging, retraction, inflammation or discharge. Hearing is grossly normal bilaterally. Nose:  External nasal examination shows no deformity or inflammation. Nasal mucosa are pink and moist without lesions or exudates. Mouth:  Oral mucosa and oropharynx without lesions or exudates.  Teeth in good repair. Neck:  No deformities, masses, or tenderness noted. No bruits Lungs:  Normal respiratory effort, chest expands symmetrically. Lungs are clear to auscultation, no crackles or wheezes. Heart:  Normal rate and regular rhythm. S1 and S2 normal without gallop, murmur, click, rub or other extra sounds. Neurologic:  alert & oriented X3 and cranial nerves II-XII intact.   Psych:  Cognition and judgment appear intact. Alert and cooperative with normal attention span and concentration. No apparent delusions, illusions, hallucinations   Impression & Recommendations:  Problem # 1:  DIZZINESS (ICD-780.4)  Problem # 2:  FIBROMYALGIA (ICD-729.1)  The following medications were removed from the medication list:    Vicodin 5-500 Mg Tabs (Hydrocodone-acetaminophen) .Marland Kitchen... 1 q 6 hours as needed pain Her updated medication list for this problem includes:    Adult Aspirin Low Strength 81 Mg Tbdp (Aspirin) .Marland Kitchen... 1 by mouth once daily  Problem # 3:  HYPERTENSION (ICD-401.9)  The following medications were removed from the medication list:    Carvedilol 25 Mg Tabs (Carvedilol) .Marland Kitchen... 1 pill by mouth 2 times daily Her updated  medication list for this problem includes:    Furosemide 80 Mg Tabs (Furosemide) .Marland Kitchen... Twice daily    Lisinopril 10 Mg Tabs (Lisinopril) .Marland Kitchen... 2 two times a day    Toprol Xl 25 Mg Xr24h-tab (Metoprolol succinate) .Marland Kitchen... 1 once daily  Problem # 4:  DEPRESSION (ICD-311)  Her updated medication list for this problem includes:    Xanax 1 Mg Tabs (Alprazolam) .Marland KitchenMarland KitchenMarland KitchenMarland Kitchen 4 times a day    Cymbalta 60 Mg Cpep (Duloxetine hcl) .Marland Kitchen... 1 by mouth two times a day  Complete Medication List: 1)  Furosemide 80 Mg Tabs (Furosemide) .... Twice daily 2)  Adult Aspirin Low Strength 81 Mg Tbdp (Aspirin) .Marland Kitchen.. 1 by mouth once daily 3)  Plavix 75 Mg Tabs (Clopidogrel bisulfate) .Marland Kitchen.. 1 by mouth once daily 4)  Klor-con M20 20 Meq Tbcr (Potassium chloride crys cr) .... Two times a day 5)  Lisinopril 10 Mg Tabs (Lisinopril) .... 2 two times a day 6)  Xanax 1 Mg Tabs (Alprazolam) .... 4 times a day 7)  Cymbalta 60 Mg Cpep (Duloxetine hcl) .Marland Kitchen.. 1 by mouth two times a day 8)  Isosorbide Dinitrate 30 Mg Tabs (Isosorbide dinitrate) .Marland Kitchen.. 1 by mouth once daily 9)  Temazepam 30 Mg Caps (Temazepam) .... At bedtime 10)  Toprol Xl 25 Mg Xr24h-tab (Metoprolol succinate) .Marland Kitchen.. 1 once daily  Patient Instructions: 1)  I think most of her symptoms are from stopping Cymbalta too abruptly. I gave her more samples today to get back on this as before. Cautioned her to never let this run out again. She will see Dr. Allyson Sabal soon, and I asked her to ask him to do another carotid doppler study at that time

## 2010-07-18 NOTE — Letter (Signed)
Summary: [facility]  [facility]   Imported By: Maryln Gottron 03/19/2010 11:18:34  _____________________________________________________________________  External Attachment:    Type:   Image     Comment:   External Document

## 2010-07-18 NOTE — Assessment & Plan Note (Signed)
Summary: back pain/njr   Vital Signs:  Patient profile:   75 year old female Weight:      197 pounds Temp:     98.4 degrees F oral BP sitting:   160 / 82  (left arm) Cuff size:   regular  Vitals Entered By: Raechel Ache, RN (January 01, 2010 2:16 PM) CC: C/o chronic constipation, LBP and thinks she saw blood in urine.   History of Present Illness: Here for 4 days of low back pain which does not respond to 5 mg Vicodin. No recent trauma, but she has been sitting around more lately than usual since it is so hot outside. She noticed some blood in her urine on one occasion a few days ago, but none since. No fever or  burning. No nausea.   Allergies: 1)  ! Pcn 2)  ! Darvocet 3)  ! Codeine 4)  ! Vioxx 5)  ! * Mobic  Past History:  Past Medical History: Reviewed history from 11/06/2009 and no changes required. COPD (ICD-496) HYPERTENSION (ICD-401.9) GERD (ICD-530.81) DEPRESSION (ICD-311) COLONIC POLYPS, HX OF (ICD-V12.72) insomnia fibromyalgia Hyperlipidemia Coronary artery disease, sees Dr. Nanetta Batty. ECHO in 09-2008 showed EF of 40-45% mild carotid artery disease  Past Surgical History: Reviewed history from 11/06/2009 and no changes required. Hysterectomy Appendectomy Tonsillectomy Tubal ligation internal converter-defibrillator placed 12-09, followed by Dr. Fredrich Birks  Review of Systems  The patient denies anorexia, fever, weight loss, weight gain, vision loss, decreased hearing, hoarseness, chest pain, syncope, dyspnea on exertion, peripheral edema, prolonged cough, headaches, hemoptysis, abdominal pain, melena, hematochezia, severe indigestion/heartburn, hematuria, incontinence, genital sores, muscle weakness, suspicious skin lesions, transient blindness, difficulty walking, depression, unusual weight change, abnormal bleeding, enlarged lymph nodes, angioedema, breast masses, and testicular masses.    Physical Exam  General:  Well-developed,well-nourished,in no  acute distress; alert,appropriate and cooperative throughout examination Abdomen:  Bowel sounds positive,abdomen soft and non-tender without masses, organomegaly or hernias noted. Msk:  No deformity or scoliosis noted of thoracic or lumbar spine.     Impression & Recommendations:  Problem # 1:  BACK PAIN, LUMBAR (ICD-724.2)  Her updated medication list for this problem includes:    Adult Aspirin Low Strength 81 Mg Tbdp (Aspirin) .Marland Kitchen... 1 by mouth once daily    Vicodin Hp 10-660 Mg Tabs (Hydrocodone-acetaminophen) .Marland Kitchen... 1 q 6 hours as needed pain  Orders: UA Dipstick w/o Micro (manual) (19147) T-Culture, Urine (82956-21308)  Complete Medication List: 1)  Furosemide 80 Mg Tabs (Furosemide) .... Twice daily 2)  Adult Aspirin Low Strength 81 Mg Tbdp (Aspirin) .Marland Kitchen.. 1 by mouth once daily 3)  Plavix 75 Mg Tabs (Clopidogrel bisulfate) .Marland Kitchen.. 1 by mouth once daily 4)  Klor-con M20 20 Meq Tbcr (Potassium chloride crys cr) .... Two times a day 5)  Lisinopril 10 Mg Tabs (Lisinopril) .... 2 two times a day 6)  Xanax 1 Mg Tabs (Alprazolam) .... 4 times a day 7)  Cymbalta 60 Mg Cpep (Duloxetine hcl) .Marland Kitchen.. 1 by mouth two times a day 8)  Isosorbide Dinitrate 30 Mg Tabs (Isosorbide dinitrate) .Marland Kitchen.. 1 by mouth once daily 9)  Temazepam 30 Mg Caps (Temazepam) .... At bedtime 10)  Toprol Xl 25 Mg Xr24h-tab (Metoprolol succinate) .Marland Kitchen.. 1 once daily 11)  Vicodin Hp 10-660 Mg Tabs (Hydrocodone-acetaminophen) .Marland Kitchen.. 1 q 6 hours as needed pain  Patient Instructions: 1)  Will culture her urine to rule out a UTI. Her back pain is probably arthritic. try higher dose of Vicodin. Get more exercise.  Prescriptions:  TOPROL XL 25 MG XR24H-TAB (METOPROLOL SUCCINATE) 1 once daily  #90 x 3   Entered by:   Raechel Ache, RN   Authorized by:   Nelwyn Salisbury MD   Signed by:   Raechel Ache, RN on 01/01/2010   Method used:   Electronically to        [facility]* (retail)       [address] [place], [place]  [address]       Ph: [phone]       Fax: [phone]   RxID:   [id number] ISOSORBIDE DINITRATE 30 MG  TABS (ISOSORBIDE DINITRATE) 1 by mouth once daily  #90 x 3   Entered by:   Raechel Ache, RN   Authorized by:   Nelwyn Salisbury MD   Signed by:   Raechel Ache, RN on 01/01/2010   Method used:   Electronically to        [facility]* (retail)       [address] [place], [place]  [address]       Ph: [phone]       Fax: [phone]   RxID:   [id number] CYMBALTA 60 MG  CPEP (DULOXETINE HCL) 1 by mouth two times a day  #180 x 3   Entered by:   Raechel Ache, RN   Authorized by:   Nelwyn Salisbury MD   Signed by:   Raechel Ache, RN on 01/01/2010   Method used:   Electronically to        [facility]* (retail)       [address] [place], [place]  [address]       Ph: [phone]       Fax: [phone]   RxID:   [id number] LISINOPRIL 10 MG  TABS (LISINOPRIL) 2 two times a day  #180 x 3   Entered by:   Raechel Ache, RN   Authorized by:   Nelwyn Salisbury MD   Signed by:   Raechel Ache, RN on 01/01/2010   Method used:   Electronically to        [facility]* (retail)       [address] [place], [place]  [address]       Ph: [phone]       Fax: [phone]   RxID:   [id number] KLOR-CON M20 20 MEQ TBCR (POTASSIUM CHLORIDE CRYS CR) two times a day  #180 x 3   Entered by:   Raechel Ache, RN   Authorized by:   Nelwyn Salisbury MD   Signed by:   Raechel Ache, RN on 01/01/2010   Method used:   Electronically to        [facility]* (retail)       [address] [place], [place]  [address]       Ph: [phone]       Fax: [phone]   RxID:   [id number] PLAVIX 75 MG  TABS (CLOPIDOGREL BISULFATE) 1 by mouth once daily  #  90 x 3   Entered by:   Raechel Ache, RN   Authorized by:   Nelwyn Salisbury MD   Signed by:   Raechel Ache, RN on 01/01/2010   Method used:   Electronically to        [facility]* (retail)       [address] [place], [place]  [address]       Ph: [phone]       Fax: [phone]   RxID:   [id number] FUROSEMIDE 80 MG  TABS (FUROSEMIDE) twice daily  #180 x 3   Entered by:   Raechel Ache, RN   Authorized by:   Nelwyn Salisbury MD   Signed by:   Raechel Ache, RN on 01/01/2010   Method used:   Electronically to        [facility]* (retail)       [address] [place], [place]  [address]       Ph: [phone]       Fax: [phone]   RxID:   [id number] VICODIN HP 10-660 MG TABS (HYDROCODONE-ACETAMINOPHEN) 1 q 6 hours as needed pain  #60 x 5   Entered and Authorized by:   Nelwyn Salisbury MD   Signed by:   Nelwyn Salisbury MD on 01/01/2010   Method used:   Print then Give to Patient   RxID:   [id number]   Laboratory Results   Urine Tests    Routine Urinalysis   Color: yellow Appearance: Clear Glucose: negative   (Normal Range: Negative) Bilirubin: negative   (Normal Range: Negative) Ketone: negative   (Normal Range: Negative) Spec. Gravity: <1.005   (Normal Range: 1.003-1.035) Blood: negative   (Normal Range: Negative) pH: 5.0   (Normal Range: 5.0-8.0) Protein: negative   (Normal Range: Negative) Urobilinogen: 0.2   (Normal Range: 0-1) Nitrite: negative   (Normal Range: Negative) Leukocyte Esterace: negative   (Normal Range: Negative)

## 2010-07-18 NOTE — Assessment & Plan Note (Signed)
Summary: high bp/son tim burgess/ps   Vital Signs:  Patient profile:   75 year old female Weight:      204 pounds Temp:     98.0 degrees F oral BP sitting:   144 / 84  (left arm) Cuff size:   large  Vitals Entered By: Alfred Levins, CMA (June 29, 2009 11:36 AM) CC: hypertension   History of Present Illness: Here for med refills and to describe some generalized fatigue that has been bothering her for several months. Her depression remains about the same, and she does not think this fatigue comes from the depression. She has run out of some of her meds, and she is about to run out of more. her BP has been running high, but I do not think it has been high enough to make her feel bed. No swelling in the feet. No chest pain or SOB. No cough or fever. No change in urinary or bowel habits.   Current Medications (verified): 1)  Furosemide 80 Mg  Tabs (Furosemide) .... Twice Daily 2)  Adult Aspirin Low Strength 81 Mg  Tbdp (Aspirin) .Marland Kitchen.. 1 By Mouth Once Daily 3)  Plavix 75 Mg  Tabs (Clopidogrel Bisulfate) .Marland Kitchen.. 1 By Mouth Once Daily 4)  Klor-Con M20 20 Meq Tbcr (Potassium Chloride Crys Cr) .... Two Times A Day 5)  Lisinopril 10 Mg  Tabs (Lisinopril) .... Take 1 Tab By Mouth Daily 6)  Xanax 1 Mg Tabs (Alprazolam) .... 4 Times A Day 7)  Vicodin 5-500 Mg Tabs (Hydrocodone-Acetaminophen) .Marland Kitchen.. 1 Q 6 Hours As Needed Pain 8)  Cymbalta 60 Mg  Cpep (Duloxetine Hcl) .Marland Kitchen.. 1 By Mouth Two Times A Day 9)  Isosorbide Dinitrate 30 Mg  Tabs (Isosorbide Dinitrate) .Marland Kitchen.. 1 By Mouth Once Daily 10)  Carvedilol 25 Mg  Tabs (Carvedilol) .Marland Kitchen.. 1 Pill By Mouth 2 Times Daily 11)  Temazepam 30 Mg Caps (Temazepam) .... At Bedtime  Allergies (verified): 1)  ! Pcn 2)  ! Darvocet 3)  ! Codeine 4)  ! Vioxx 5)  ! * Mobic  Past History:  Past Medical History: Reviewed history from 06/21/2008 and no changes required. COPD (ICD-496) HYPERTENSION (ICD-401.9) GERD (ICD-530.81) DEPRESSION (ICD-311) COLONIC POLYPS, HX  OF (ICD-V12.72) insomnia fibromyalgia Hyperlipidemia Coronary artery disease, sees Dr. Allyson Sabal  Past Surgical History: Reviewed history from 06/21/2008 and no changes required. Hysterectomy Appendectomy Tonsillectomy Tubal ligation internal converter-defibrillator placed 12-09  Review of Systems  The patient denies anorexia, fever, weight loss, weight gain, vision loss, decreased hearing, hoarseness, chest pain, syncope, dyspnea on exertion, peripheral edema, prolonged cough, headaches, hemoptysis, abdominal pain, melena, hematochezia, severe indigestion/heartburn, hematuria, incontinence, genital sores, muscle weakness, suspicious skin lesions, transient blindness, difficulty walking, depression, unusual weight change, abnormal bleeding, enlarged lymph nodes, angioedema, breast masses, and testicular masses.    Physical Exam  General:  alert. in NAD. Walks with her walker Neck:  No deformities, masses, or tenderness noted. Lungs:  Normal respiratory effort, chest expands symmetrically. Lungs are clear to auscultation, no crackles or wheezes. Heart:  Normal rate and regular rhythm. S1 and S2 normal without gallop, murmur, click, rub or other extra sounds. Extremities:  no edema Psych:  Oriented X3, memory intact for recent and remote, normally interactive, good eye contact, and depressed affect.     Impression & Recommendations:  Problem # 1:  CORONARY ARTERY DISEASE (ICD-414.00)  Her updated medication list for this problem includes:    Furosemide 80 Mg Tabs (Furosemide) .Marland Kitchen... Twice daily    Adult  Aspirin Low Strength 81 Mg Tbdp (Aspirin) .Marland Kitchen... 1 by mouth once daily    Plavix 75 Mg Tabs (Clopidogrel bisulfate) .Marland Kitchen... 1 by mouth once daily    Lisinopril 10 Mg Tabs (Lisinopril) .Marland Kitchen... Take 1 tab by mouth daily    Isosorbide Dinitrate 30 Mg Tabs (Isosorbide dinitrate) .Marland Kitchen... 1 by mouth once daily    Carvedilol 25 Mg Tabs (Carvedilol) .Marland Kitchen... 1 pill by mouth 2 times daily  Problem # 2:   FIBROMYALGIA (ICD-729.1)  Her updated medication list for this problem includes:    Adult Aspirin Low Strength 81 Mg Tbdp (Aspirin) .Marland Kitchen... 1 by mouth once daily    Vicodin 5-500 Mg Tabs (Hydrocodone-acetaminophen) .Marland Kitchen... 1 q 6 hours as needed pain  Problem # 3:  COPD (ICD-496)  Problem # 4:  HYPERTENSION (ICD-401.9)  Her updated medication list for this problem includes:    Furosemide 80 Mg Tabs (Furosemide) .Marland Kitchen... Twice daily    Lisinopril 10 Mg Tabs (Lisinopril) .Marland Kitchen... Take 1 tab by mouth daily    Carvedilol 25 Mg Tabs (Carvedilol) .Marland Kitchen... 1 pill by mouth 2 times daily  Orders: UA Dipstick w/o Micro (automated)  ([address]1003) Venipuncture ([address]) TLB-BMP (Basic Metabolic Panel-BMET) (80048-METABOL) TLB-CBC Platelet - w/Differential (85025-CBCD) TLB-Hepatic/Liver Function Pnl (80076-HEPATIC) TLB-TSH (Thyroid Stimulating Hormone) (84443-TSH) TLB-B12, Serum-Total ONLY (60454-U98)  Complete Medication List: 1)  Furosemide 80 Mg Tabs (Furosemide) .... Twice daily 2)  Adult Aspirin Low Strength 81 Mg Tbdp (Aspirin) .Marland Kitchen.. 1 by mouth once daily 3)  Plavix 75 Mg Tabs (Clopidogrel bisulfate) .Marland Kitchen.. 1 by mouth once daily 4)  Klor-con M20 20 Meq Tbcr (Potassium chloride crys cr) .... Two times a day 5)  Lisinopril 10 Mg Tabs (Lisinopril) .... Take 1 tab by mouth daily 6)  Xanax 1 Mg Tabs (Alprazolam) .... 4 times a day 7)  Vicodin 5-500 Mg Tabs (Hydrocodone-acetaminophen) .Marland Kitchen.. 1 q 6 hours as needed pain 8)  Cymbalta 60 Mg Cpep (Duloxetine hcl) .Marland Kitchen.. 1 by mouth two times a day 9)  Isosorbide Dinitrate 30 Mg Tabs (Isosorbide dinitrate) .Marland Kitchen.. 1 by mouth once daily 10)  Carvedilol 25 Mg Tabs (Carvedilol) .Marland Kitchen.. 1 pill by mouth 2 times daily 11)  Temazepam 30 Mg Caps (Temazepam) .... At bedtime  Patient Instructions: 1)  Get labs today. She is not fasting so we will check lipids another time. Refilled meds. She is to see Dr. Allyson Sabal next week. Prescriptions: CARVEDILOL 25 MG  TABS (CARVEDILOL) 1 pill by mouth 2  times daily  #180 x 3   Entered and Authorized by:   Nelwyn Salisbury MD   Signed by:   Nelwyn Salisbury MD on 06/29/2009   Method used:   Print then Give to Patient   RxID:   [id number] LISINOPRIL 10 MG  TABS (LISINOPRIL) Take 1 tab by mouth daily  #90 x 3   Entered and Authorized by:   Nelwyn Salisbury MD   Signed by:   Nelwyn Salisbury MD on 06/29/2009   Method used:   Print then Give to Patient   RxID:   [id number] KLOR-CON M20 20 MEQ TBCR (POTASSIUM CHLORIDE CRYS CR) two times a day  #180 x 3   Entered and Authorized by:   Nelwyn Salisbury MD   Signed by:   Nelwyn Salisbury MD on 06/29/2009   Method used:   Print then Give to Patient   RxID:   [id number] FUROSEMIDE 80 MG  TABS (FUROSEMIDE) twice daily  #180 x 3   Entered  and Authorized by:   Nelwyn Salisbury MD   Signed by:   Nelwyn Salisbury MD on 06/29/2009   Method used:   Print then Give to Patient   RxID:   [id number]   Appended Document: high bp/son tim burgess/ps  Laboratory Results   Urine Tests    Routine Urinalysis   Color: yellow Appearance: Clear Glucose: negative   (Normal Range: Negative) Bilirubin: negative   (Normal Range: Negative) Ketone: negative   (Normal Range: Negative) Spec. Gravity: <1.005   (Normal Range: 1.003-1.035) Blood: negative   (Normal Range: Negative) pH: 5.0   (Normal Range: 5.0-8.0) Protein: negative   (Normal Range: Negative) Urobilinogen: 0.2   (Normal Range: 0-1) Nitrite: negative   (Normal Range: Negative) Leukocyte Esterace: negative   (Normal Range: Negative)    Comments: Joanne Chars CMA  June 29, 2009 4:21 PM      Appended Document: Orders Update    Clinical Lists Changes  Orders: Added new Test order of T- * Misc. Laboratory test [phone]) - Signed

## 2010-07-18 NOTE — Progress Notes (Signed)
Summary: refill alprazolam   Phone Note From Pharmacy   Caller: [facility]  [address] [phone]* Summary of Call: refill alprazolam 1mg   Initial call taken by: Pura Spice, RN,  July 03, 2010 2:34 PM  Follow-up for Phone Call        we did this on 06-28-10 Follow-up by: Nelwyn Salisbury MD,  July 03, 2010 5:28 PM  Additional Follow-up for Phone Call Additional follow up Details #1::        pharmacy called  Additional Follow-up by: Pura Spice, RN,  July 04, 2010 8:27 AM

## 2010-07-18 NOTE — Procedures (Signed)
Summary: Colonoscopy & Pathology   Colonoscopy  Procedure date:  02/17/2002  Findings:      Location:  [facility].     NAME:  ANIELLA, Sydney Phillips                       ACCOUNT NO.:  [id number]   MEDICAL RECORD NO.:  [id number]                   PATIENT TYPE:  AMB   LOCATION:  ENDO                                 FACILITY:  [facility]   PHYSICIAN:  Georgiana Spinner, M.D.                 DATE OF BIRTH:  Jan 03, 1936   DATE OF PROCEDURE:  02/17/2002  DATE OF DISCHARGE:                                 OPERATIVE REPORT   PROCEDURE PERFORMED:  Colonoscopy.   ENDOSCOPIST:  Georgiana Spinner, M.D.   INDICATIONS FOR PROCEDURE:  Colon polyp.   ANESTHESIA:  None further given.   DESCRIPTION OF PROCEDURE:  With the patient mildly sedated in the left  lateral decubitus position, the Olympus video colonoscope was inserted in  the rectum and passed under direct vision to the cecum, identified by the  ileocecal valve and appendiceal orifice, both of which were photographed.  From this point the colonoscope was slowly withdrawn taking circumferential  views of the entire colonic mucosa stopping only in the rectum, where a  polyp was seen both on direct and retroflex view.  It was removed using  snare cautery technique setting of 20/20 blended current.  A second polyp  adjacent to it but slightly more distal was also removed using hot biopsy  forceps technique, again a setting of 20/20 blended current.  Both polyps  were retrieved for pathology.  The patient's vital signs and pulse oximeter  remained stable.  The patient tolerated the procedure well without apparent  complications.   FINDINGS:  Polyps of rectum, await biopsy report.  Patient will call me for  results and follow up with me as an outpatient.                                                Georgiana Spinner, M.D.    GMO/MEDQ  D:  02/17/2002  T:  02/18/2002  Job:  [id number]   cc:   Lacretia Leigh. Quintella Reichert, M.D.   This report was created  from the original endoscopy report, which was reviewed and signed by the above listed endoscopist.    SP Surgical Pathology - STATUS: Final             By: Windy Kalata,      Perform Date: 4 Sep03 09:53  Ordered By: Jarvis Newcomer,            Ordered Date:  Facility: [facility]  Department: CPATH  Service Report Text  [facility]   [address]   [place], [place] [address]   [phone]    REPORT OF SURGICAL PATHOLOGY    Case #: [id number]   Patient Name: Sydney Phillips, Sydney Phillips   PID: [id number]   Pathologist: Alden Server A. Delila Spence, MD   DOB/Age [birth year] (Age: 75) Gender: F   Date Taken: 02/17/2002   Date Received: 02/17/2002    FINAL DIAGNOSIS    ***MICROSCOPIC EXAMINATION AND DIAGNOSIS***    COLON AT 20 CM: HYPERPLASTIC POLYP. NO ADENOMATOUS CHANGE OR   MALIGNANCY IDENTIFIED.    cf   Date Reported: 02/18/2002 Lyn Hollingshead. Delila Spence, MD   *** Electronically Signed Out By EAA ***    Clinical information   (lb)    specimen(s) obtained   Rectum, polyp(s)    Gross Description   Received in formalin is a polypoid portion of soft reddish-tan   tissue that measures 0.6 x 0.5 x 0.4 cm and a 0.2 cm fragment of   soft whitish gray material. The polyp is bisected and the entire   specimen is submitted in one cassette. (JBM:jn, 02/17/02)    jn/

## 2010-07-18 NOTE — Assessment & Plan Note (Signed)
Summary: numbness right and hip/hot hurting sensastion/all the way dow...   Vital Signs:  Patient profile:   75 year old female Weight:      194 pounds BMI:     31.43 BP sitting:   170 / 68  (right arm) Cuff size:   regular  Vitals Entered By: Raechel Ache, RN (January 25, 2010 10:49 AM) CC: C/o pain all way down back of R leg.- hard to walk.   History of Present Illness: Here for continuing sharp pains from the right lower back through the right buttock and down the back of the right leg to the ankle. This is not well controlled with Vicodin. Now for several days she has also experienced weakness and numbness of the entire right leg.   Allergies: 1)  ! Pcn 2)  ! Darvocet 3)  ! Codeine 4)  ! Vioxx 5)  ! * Mobic  Past History:  Past Medical History: Reviewed history from 11/06/2009 and no changes required. COPD (ICD-496) HYPERTENSION (ICD-401.9) GERD (ICD-530.81) DEPRESSION (ICD-311) COLONIC POLYPS, HX OF (ICD-V12.72) insomnia fibromyalgia Hyperlipidemia Coronary artery disease, sees Dr. Nanetta Batty. ECHO in 09-2008 showed EF of 40-45% mild carotid artery disease  Past Surgical History: Hysterectomy Appendectomy Tonsillectomy Tubal ligation internal converter-defibrillator placed 12-09, followed by Dr. Fredrich Birks (cannot ever have MRI's)  Review of Systems  The patient denies anorexia, fever, weight loss, weight gain, vision loss, decreased hearing, hoarseness, chest pain, syncope, dyspnea on exertion, peripheral edema, prolonged cough, headaches, hemoptysis, abdominal pain, melena, hematochezia, severe indigestion/heartburn, hematuria, incontinence, genital sores, muscle weakness, suspicious skin lesions, transient blindness, difficulty walking, depression, unusual weight change, abnormal bleeding, enlarged lymph nodes, angioedema, breast masses, and testicular masses.    Physical Exam  General:  in pain, walks with a walker Msk:  tender in the lower right back  and the right sciatic notch. Positive SLR   Impression & Recommendations:  Problem # 1:  BACK PAIN, LUMBAR (ICD-724.2)  Her updated medication list for this problem includes:    Adult Aspirin Low Strength 81 Mg Tbdp (Aspirin) .Marland Kitchen... 1 by mouth once daily    Vicodin Hp 10-660 Mg Tabs (Hydrocodone-acetaminophen) .Marland Kitchen... 1 q 6 hours as needed pain  Orders: Radiology Referral (Radiology)  Complete Medication List: 1)  Furosemide 80 Mg Tabs (Furosemide) .... Twice daily 2)  Adult Aspirin Low Strength 81 Mg Tbdp (Aspirin) .Marland Kitchen.. 1 by mouth once daily 3)  Plavix 75 Mg Tabs (Clopidogrel bisulfate) .Marland Kitchen.. 1 by mouth once daily 4)  Klor-con M20 20 Meq Tbcr (Potassium chloride crys cr) .... Two times a day 5)  Lisinopril 10 Mg Tabs (Lisinopril) .... 2 two times a day 6)  Xanax 1 Mg Tabs (Alprazolam) .... 4 times a day 7)  Cymbalta 60 Mg Cpep (Duloxetine hcl) .Marland Kitchen.. 1 by mouth two times a day 8)  Isosorbide Dinitrate 30 Mg Tabs (Isosorbide dinitrate) .Marland Kitchen.. 1 by mouth once daily 9)  Temazepam 30 Mg Caps (Temazepam) .... At bedtime 10)  Toprol Xl 25 Mg Xr24h-tab (Metoprolol succinate) .Marland Kitchen.. 1 once daily 11)  Vicodin Hp 10-660 Mg Tabs (Hydrocodone-acetaminophen) .Marland Kitchen.. 1 q 6 hours as needed pain 12)  Miralax Powd (Polyethylene glycol 3350) .... Use two times a day  Patient Instructions: 1)  This is consistent with a herniated disc. Will send her for a CT today, since she cannot have MRIs.

## 2010-07-18 NOTE — Progress Notes (Signed)
Summary: REQ FOR SAMPLES (Cymbalta)  Phone Note Call from Patient   Caller: Patient  [phone] Summary of Call: Pt called to request samples of Cymbalta 60mg .... Pt can be reached at [phone] when ready for p/u.  Initial call taken by: Debbra Riding,  December 06, 2009 11:06 AM  Follow-up for Phone Call        samples are ready Follow-up by: Nelwyn Salisbury MD,  December 07, 2009 8:40 AM  Additional Follow-up for Phone Call Additional follow up Details #1::        Phone Call Completed Additional Follow-up by: Raechel Ache, RN,  December 07, 2009 8:49 AM

## 2010-07-18 NOTE — Progress Notes (Signed)
  Phone Note Call from Patient   Caller: Patient Summary of Call: insurance won't pay for Cymbalta two times a day- pls send Rx for 1 once daily  to [facility] and samples given. Initial call taken by: Raechel Ache, RN,  February 14, 2010 11:21 AM    New/Updated Medications: CYMBALTA 60 MG  CPEP (DULOXETINE HCL) 1 by mouth once daily Prescriptions: CYMBALTA 60 MG  CPEP (DULOXETINE HCL) 1 by mouth once daily  #30 x 5   Entered by:   Raechel Ache, RN   Authorized by:   Nelwyn Salisbury MD   Signed by:   Raechel Ache, RN on 02/14/2010   Method used:   Electronically to        [facility]* (retail)       [address] [place], [place]  [address]       Ph: [phone]       Fax: [phone]   RxID:   [id number]

## 2010-07-18 NOTE — Miscellaneous (Signed)
Summary: Orders Update   Clinical Lists Changes  Problems: Added new problem of SPINAL STENOSIS (ICD-724.00) Orders: Added new Referral order of Neurosurgeon Referral (Neurosurgeon) - Signed

## 2010-07-18 NOTE — Letter (Signed)
Summary: [facility]  [facility]   Imported By: Maryln Gottron 04/30/2010 10:20:05  _____________________________________________________________________  External Attachment:    Type:   Image     Comment:   External Document

## 2010-07-18 NOTE — Letter (Signed)
Summary: [facility]  [facility]   Imported By: Maryln Gottron 06/11/2010 12:33:45  _____________________________________________________________________  External Attachment:    Type:   Image     Comment:   External Document

## 2010-07-19 ENCOUNTER — Telehealth: Payer: Self-pay | Admitting: Family Medicine

## 2010-07-19 NOTE — Telephone Encounter (Signed)
Pt req samples Cymbalta 60mg . Pls advise.

## 2010-07-19 NOTE — Telephone Encounter (Signed)
Pt aware and will pick up samples 

## 2010-07-22 ENCOUNTER — Encounter: Payer: Self-pay | Admitting: Family Medicine

## 2010-07-22 ENCOUNTER — Ambulatory Visit (INDEPENDENT_AMBULATORY_CARE_PROVIDER_SITE_OTHER): Payer: MEDICARE | Admitting: Family Medicine

## 2010-07-22 DIAGNOSIS — M353 Polymyalgia rheumatica: Secondary | ICD-10-CM

## 2010-07-22 DIAGNOSIS — I10 Essential (primary) hypertension: Secondary | ICD-10-CM

## 2010-07-22 MED ORDER — LISINOPRIL 20 MG PO TABS: 20.0000 mg | ORAL_TABLET | Freq: Every day | ORAL | Status: DC

## 2010-07-22 MED ORDER — PREDNISONE 10 MG PO TABS: 20.0000 mg | ORAL_TABLET | Freq: Every day | ORAL | Status: AC

## 2010-07-22 NOTE — Progress Notes (Signed)
  Subjective:    Patient ID: Sydney Phillips, female    DOB: 1936-02-14, 75 y.o.   MRN: [id number]  Muscle Pain Pertinent negatives include no joint swelling.   Here to ask about a rx and to describe aching pains in the shoulders and hips which started about 2 months ago. These are worse in the mornings and they improve throughout the day. Also she has been taking Lisinopril 10 mg tabs two at a time, and she wants to take one 20 mg tablet instead.    Review of Systems  Constitutional: Negative.   Respiratory: Negative.   Cardiovascular: Negative.   Musculoskeletal: Positive for myalgias. Negative for back pain, joint swelling, arthralgias and gait problem.  Neurological: Negative.        Objective:   Physical Exam  Constitutional: She is oriented to person, place, and time. She appears well-developed and well-nourished. No distress.  Cardiovascular: Normal rate, regular rhythm, normal heart sounds and intact distal pulses.  Exam reveals no gallop and no friction rub.   No murmur heard. Pulmonary/Chest: Effort normal and breath sounds normal. No respiratory distress. She has no wheezes. She has no rales. She exhibits no tenderness.  Musculoskeletal: Normal range of motion. She exhibits no edema and no tenderness.  Neurological: She is alert and oriented to person, place, and time. She exhibits normal muscle tone. Coordination normal.          Assessment & Plan:  We will change her Lisinopril rx as above. Her myalgias sound like PMR, so we will try low dose  Prednisone daily.

## 2010-08-01 ENCOUNTER — Telehealth: Payer: Self-pay | Admitting: Family Medicine

## 2010-08-01 NOTE — Telephone Encounter (Signed)
Requesting Cymbalta 60mg  samples.

## 2010-08-01 NOTE — Telephone Encounter (Signed)
Pt aware cymbalta samples exp 10-2011  Lot  [id number] A # 28 and pt to pick up today

## 2010-08-16 ENCOUNTER — Telehealth: Payer: Self-pay | Admitting: Family Medicine

## 2010-08-16 NOTE — Telephone Encounter (Signed)
Pt needs sample of cymbalta 60mg 

## 2010-08-16 NOTE — Telephone Encounter (Signed)
Samples upfront, pt aware 

## 2010-08-16 NOTE — Telephone Encounter (Signed)
I have some in a bag for her

## 2010-08-29 LAB — CBC
HCT: 28.1 % — ABNORMAL LOW (ref 36.0–46.0)
HCT: 47.1 % — ABNORMAL HIGH (ref 36.0–46.0)
Hemoglobin: 15.9 g/dL — ABNORMAL HIGH (ref 12.0–15.0)
MCH: 29.2 pg (ref 26.0–34.0)
MCH: 29.9 pg (ref 26.0–34.0)
MCH: 30.2 pg (ref 26.0–34.0)
MCHC: 32.8 g/dL (ref 30.0–36.0)
MCHC: 32.9 g/dL (ref 30.0–36.0)
MCHC: 33.8 g/dL (ref 30.0–36.0)
MCHC: 34.5 g/dL (ref 30.0–36.0)
Platelets: 182 10*3/uL (ref 150–400)
Platelets: 276 10*3/uL (ref 150–400)
RBC: 5.26 MIL/uL — ABNORMAL HIGH (ref 3.87–5.11)
RDW: 12.9 % (ref 11.5–15.5)
RDW: 13.3 % (ref 11.5–15.5)
WBC: 10.3 10*3/uL (ref 4.0–10.5)

## 2010-08-29 LAB — COMPREHENSIVE METABOLIC PANEL
AST: 41 U/L — ABNORMAL HIGH (ref 0–37)
Albumin: 3.3 g/dL — ABNORMAL LOW (ref 3.5–5.2)
BUN: 11 mg/dL (ref 6–23)
CO2: 30 mEq/L (ref 19–32)
Calcium: 8.1 mg/dL — ABNORMAL LOW (ref 8.4–10.5)
Creatinine, Ser: 1.05 mg/dL (ref 0.4–1.2)
GFR calc Af Amer: >60 mL/min (ref >60)
GFR calc non Af Amer: 52 mL/min — ABNORMAL LOW (ref >60)

## 2010-08-29 LAB — CARDIAC PANEL(CRET KIN+CKTOT+MB+TROPI)
CK, MB: 16.1 ng/mL (ref 0.3–4.0)
CK, MB: 16.6 ng/mL (ref 0.3–4.0)
CK, MB: 19.2 ng/mL (ref 0.3–4.0)
Relative Index: 0.7 (ref 0.0–2.5)
Relative Index: 1 (ref 0.0–2.5)
Troponin I: 0.31 ng/mL — ABNORMAL HIGH (ref 0.00–0.06)
Troponin I: 0.33 ng/mL — ABNORMAL HIGH (ref 0.00–0.06)

## 2010-08-29 LAB — BASIC METABOLIC PANEL
BUN: 11 mg/dL (ref 6–23)
BUN: 13 mg/dL (ref 6–23)
BUN: 7 mg/dL (ref 6–23)
BUN: 8 mg/dL (ref 6–23)
BUN: 8 mg/dL (ref 6–23)
BUN: 9 mg/dL (ref 6–23)
CO2: 24 mEq/L (ref 19–32)
CO2: 31 mEq/L (ref 19–32)
Calcium: 8.2 mg/dL — ABNORMAL LOW (ref 8.4–10.5)
Calcium: 8.4 mg/dL (ref 8.4–10.5)
Calcium: 8.9 mg/dL (ref 8.4–10.5)
Calcium: 9.9 mg/dL (ref 8.4–10.5)
Chloride: 100 mEq/L (ref 96–112)
Chloride: 101 mEq/L (ref 96–112)
Chloride: 99 mEq/L (ref 96–112)
Creatinine, Ser: 0.9 mg/dL (ref 0.4–1.2)
Creatinine, Ser: 0.93 mg/dL (ref 0.4–1.2)
Creatinine, Ser: 1.29 mg/dL — ABNORMAL HIGH (ref 0.4–1.2)
GFR calc Af Amer: >60 mL/min (ref >60)
GFR calc Af Amer: >60 mL/min (ref >60)
GFR calc Af Amer: >60 mL/min (ref >60)
GFR calc non Af Amer: 41 mL/min — ABNORMAL LOW (ref >60)
GFR calc non Af Amer: 59 mL/min — ABNORMAL LOW (ref >60)
GFR calc non Af Amer: >60 mL/min (ref >60)
GFR calc non Af Amer: >60 mL/min (ref >60)
GFR calc non Af Amer: >60 mL/min (ref >60)
Glucose, Bld: 144 mg/dL — ABNORMAL HIGH (ref 70–99)
Glucose, Bld: 178 mg/dL — ABNORMAL HIGH (ref 70–99)
Potassium: 3.3 mEq/L — ABNORMAL LOW (ref 3.5–5.1)
Potassium: 3.5 mEq/L (ref 3.5–5.1)
Potassium: 3.7 mEq/L (ref 3.5–5.1)
Sodium: 134 mEq/L — ABNORMAL LOW (ref 135–145)
Sodium: 136 mEq/L (ref 135–145)
Sodium: 141 mEq/L (ref 135–145)
Sodium: 142 mEq/L (ref 135–145)

## 2010-08-29 LAB — URINE CULTURE
Colony Count: NO GROWTH
Culture  Setup Time: 201109221934
Culture: NO GROWTH
Special Requests: NEGATIVE

## 2010-08-29 LAB — GLUCOSE, CAPILLARY
Glucose-Capillary: 101 mg/dL — ABNORMAL HIGH (ref 70–99)
Glucose-Capillary: 102 mg/dL — ABNORMAL HIGH (ref 70–99)
Glucose-Capillary: 110 mg/dL — ABNORMAL HIGH (ref 70–99)
Glucose-Capillary: 112 mg/dL — ABNORMAL HIGH (ref 70–99)
Glucose-Capillary: 113 mg/dL — ABNORMAL HIGH (ref 70–99)
Glucose-Capillary: 113 mg/dL — ABNORMAL HIGH (ref 70–99)
Glucose-Capillary: 114 mg/dL — ABNORMAL HIGH (ref 70–99)
Glucose-Capillary: 129 mg/dL — ABNORMAL HIGH (ref 70–99)
Glucose-Capillary: 91 mg/dL (ref 70–99)
Glucose-Capillary: 92 mg/dL (ref 70–99)
Glucose-Capillary: 97 mg/dL (ref 70–99)

## 2010-08-29 LAB — POCT I-STAT 4, (NA,K, GLUC, HGB,HCT)
Glucose, Bld: 102 mg/dL — ABNORMAL HIGH (ref 70–99)
HCT: 36 % (ref 36.0–46.0)
Sodium: 144 mEq/L (ref 135–145)

## 2010-08-29 LAB — CULTURE, BLOOD (ROUTINE X 2)
Culture  Setup Time: 201109230025
Culture: NO GROWTH

## 2010-08-29 LAB — ABO/RH: ABO/RH(D): B POS

## 2010-08-29 LAB — PHOSPHORUS
Phosphorus: 1.5 mg/dL — ABNORMAL LOW (ref 2.3–4.6)
Phosphorus: 2.1 mg/dL — ABNORMAL LOW (ref 2.3–4.6)
Phosphorus: 2.4 mg/dL (ref 2.3–4.6)

## 2010-08-29 LAB — BRAIN NATRIURETIC PEPTIDE: Pro B Natriuretic peptide (BNP): 243 pg/mL — ABNORMAL HIGH (ref 0.0–100.0)

## 2010-08-29 LAB — URIC ACID, RANDOM URINE: Uric Acid, Urine: 13.6 mg/dL

## 2010-08-29 LAB — BLOOD GAS, ARTERIAL
Acid-Base Excess: 0.5 mmol/L (ref 0.0–2.0)
Bicarbonate: 24.6 mEq/L — ABNORMAL HIGH (ref 20.0–24.0)
Patient temperature: 98.4
TCO2: 25.8 mmol/L (ref 0–100)
pCO2 arterial: 39.6 mmHg (ref 35.0–45.0)
pH, Arterial: 7.41 — ABNORMAL HIGH (ref 7.350–7.400)

## 2010-08-29 LAB — PROCALCITONIN: Procalcitonin: <0.1 ng/mL

## 2010-08-29 LAB — OSMOLALITY, URINE: Osmolality, Ur: 121 mOsm/kg — ABNORMAL LOW (ref 390–1090)

## 2010-08-29 LAB — MAGNESIUM: Magnesium: 1.7 mg/dL (ref 1.5–2.5)

## 2010-08-29 LAB — SODIUM, URINE, RANDOM: Sodium, Ur: <10 mEq/L

## 2010-09-02 ENCOUNTER — Ambulatory Visit
Admission: RE | Admit: 2010-09-02 | Discharge: 2010-09-02 | Disposition: A | Discharge status: Home / Self Care | Payer: Medicare Other | Source: Ambulatory Visit | Attending: Cardiovascular Disease | Admitting: Cardiovascular Disease

## 2010-09-02 ENCOUNTER — Other Ambulatory Visit: Payer: Self-pay | Admitting: Cardiovascular Disease

## 2010-09-02 DIAGNOSIS — Z01811 Encounter for preprocedural respiratory examination: Secondary | ICD-10-CM

## 2010-09-02 LAB — URINALYSIS, ROUTINE W REFLEX MICROSCOPIC
Hgb urine dipstick: NEGATIVE
Nitrite: NEGATIVE
Protein, ur: NEGATIVE mg/dL
Specific Gravity, Urine: 1.007 (ref 1.005–1.030)
Urobilinogen, UA: 0.2 mg/dL (ref 0.0–1.0)

## 2010-09-02 LAB — BASIC METABOLIC PANEL
Chloride: 99 mEq/L (ref 96–112)
Creatinine, Ser: 0.89 mg/dL (ref 0.4–1.2)
GFR calc Af Amer: >60 mL/min (ref >60)
GFR calc non Af Amer: >60 mL/min (ref >60)
Potassium: 3 mEq/L — ABNORMAL LOW (ref 3.5–5.1)

## 2010-09-02 LAB — CBC
MCV: 90.5 fL (ref 78.0–100.0)
RBC: 5.23 MIL/uL — ABNORMAL HIGH (ref 3.87–5.11)
WBC: 7.5 10*3/uL (ref 4.0–10.5)

## 2010-09-02 LAB — PROTIME-INR: Prothrombin Time: 12.7 seconds (ref 11.6–15.2)

## 2010-09-06 ENCOUNTER — Inpatient Hospital Stay (HOSPITAL_COMMUNITY)
Admission: RE | Admit: 2010-09-06 | Discharge: 2010-09-07 | DRG: 254 | Disposition: A | Discharge status: Home / Self Care | Payer: Medicare Other | Source: Ambulatory Visit | Attending: Cardiovascular Disease | Admitting: Cardiovascular Disease

## 2010-09-06 DIAGNOSIS — I10 Essential (primary) hypertension: Secondary | ICD-10-CM | POA: Diagnosis present

## 2010-09-06 DIAGNOSIS — I70219 Atherosclerosis of native arteries of extremities with intermittent claudication, unspecified extremity: Principal | ICD-10-CM | POA: Diagnosis present

## 2010-09-06 DIAGNOSIS — I6529 Occlusion and stenosis of unspecified carotid artery: Secondary | ICD-10-CM | POA: Diagnosis present

## 2010-09-06 DIAGNOSIS — E785 Hyperlipidemia, unspecified: Secondary | ICD-10-CM | POA: Diagnosis present

## 2010-09-06 DIAGNOSIS — Z23 Encounter for immunization: Secondary | ICD-10-CM

## 2010-09-06 DIAGNOSIS — F17200 Nicotine dependence, unspecified, uncomplicated: Secondary | ICD-10-CM | POA: Diagnosis present

## 2010-09-06 DIAGNOSIS — E876 Hypokalemia: Secondary | ICD-10-CM | POA: Diagnosis present

## 2010-09-06 DIAGNOSIS — Z95810 Presence of automatic (implantable) cardiac defibrillator: Secondary | ICD-10-CM

## 2010-09-06 DIAGNOSIS — I2510 Atherosclerotic heart disease of native coronary artery without angina pectoris: Secondary | ICD-10-CM | POA: Diagnosis present

## 2010-09-06 DIAGNOSIS — I2589 Other forms of chronic ischemic heart disease: Secondary | ICD-10-CM | POA: Diagnosis present

## 2010-09-06 LAB — POCT ACTIVATED CLOTTING TIME
Activated Clotting Time: 176 seconds
Activated Clotting Time: 187 seconds

## 2010-09-07 LAB — BASIC METABOLIC PANEL
BUN: 10 mg/dL (ref 6–23)
Calcium: 8.9 mg/dL (ref 8.4–10.5)
Creatinine, Ser: 0.92 mg/dL (ref 0.4–1.2)
GFR calc Af Amer: >60 mL/min (ref >60)
GFR calc non Af Amer: >60 mL/min (ref >60)

## 2010-09-07 LAB — CBC
MCH: 27.3 pg (ref 26.0–34.0)
MCHC: 32.3 g/dL (ref 30.0–36.0)
MCV: 84.6 fL (ref 78.0–100.0)
Platelets: 268 10*3/uL (ref 150–400)
RBC: 4.47 MIL/uL (ref 3.87–5.11)
RDW: 15.4 % (ref 11.5–15.5)

## 2010-09-11 ENCOUNTER — Encounter: Payer: Self-pay | Admitting: Family Medicine

## 2010-09-11 NOTE — Procedures (Signed)
[name], Phillips             ACCOUNT NO.:  [id number]  MEDICAL RECORD NO.:  [id number]           PATIENT TYPE:  LOCATION:                                 FACILITY:  PHYSICIAN:  Nanetta Batty, M.D.   DATE OF BIRTH:  03/01/1936  DATE OF PROCEDURE: DATE OF DISCHARGE:                   PERIPHERAL VASCULAR INVASIVE PROCEDURE   Abdominal aortography with bifemoral runoff, PTA, and stent.  Sydney Phillips is a 75 year old, mildly overweight, widowed, Caucasian female, mother of 3, grandmother of 4 grandchildren, who I last saw in the office on August 09, 2010.  She has a history of ischemic cardiomyopathy with EF of 20% and 1+ MR in the past.  She was catheterized in 2009 by Dr. Elsie Lincoln revealing a 75% proximal LAD and RCA lesion with an EF of 20%.  The problems include moderate internal carotid artery stenosis and high-grade left subclavian artery stenosis, which she said she is asymptomatic from.  She still continues to smoke 6 cigarettes a day.  She has hypertension and hyperlipidemia as well.  She had a bi-VD placed by Dr. Sherren Kerns, May 17, 2008 for primary prevention.  EF improved to 40%-45%.  She complains of claudication, which has gotten worse.  Dopplers revealed ABIs in the 0.9 range with high frequency signal in the right iliac.  Her major symptoms are in the right side.  She had recent echo that showed normal LV function and a Myoview that was low risk.  She presents now for angiography, potential intervention for lifestyle-limiting claudication.  PROCEDURE DESCRIPTION:  The patient was brought to second floor [facility] angiographic suite in the post absorptive state.  She was not premedicated with p.o. Valium.  Her right groin was prepped and shaved in usual sterile fashion.  A 1% Xylocaine was used for local anesthesia. A short 5-French sheath was inserted into the right femoral artery using standard Seldinger technique.  A 5-French pigtail catheter was used  for abdominal aortography with bifemoral runoff using bolus chase digital subtraction step table technique.  Visipaque dye was used for the entirety of the case.  Retrograde aortic pressure was monitored during the case.  ANGIOGRAPHIC RESULTS: 1. Abdominal aorta.     a.     Renal arteries - normal.     b.     Infrarenal abdominal aorta; mild atherosclerotic changes. 2. Left lower extremity;     a.     50% segmental mid left SFA stenosis with three-vessel      runoff.     b.     80% distal tibial peroneal trunk stenosis before      bifurcation, 70% proximal segmental anterior tibial stenosis. 3. Right lower extremity;     a.     80% ostial right common iliac artery stenosis with a 40-mm      pullback gradient after administration of 200 mcg of intra-      arterial nitroglycerin.     b.     80% focal proximal right SFA stenosis with two-vessel      runoff.  Procedure Description:  The patient received 4000 units of heparin intravenously.  The 5-French sheath was exchanged over a [employer]  wire for a 7-French bright tip sheath.  A pullback gradient was performed with 5-French end-hole catheter.  Following this, predilatation  was performed with a 5 x 2 power flex over a 3.5 Versacore wire.  Stenting was then performed with an 8 x 24 Genesis at balloon-expandable stent. This was deployed at 8-10 atmospheres, resulting reduction of 80% ostial right common iliac artery stenosis to less than 10% residual.  Patient tolerated the procedure well.  The guidewire was removed.  Sheath was left in place.  ACT was measured less than 200.  Total contrast used was 225 mL with minimal blood loss.  IMPRESSION:  Successful PTA and stenting of an ostial right common iliac artery stenosis for lifestyle-limiting claudication.  The patient was treated with aspirin, Plavix, gently hydrated, and discharged home in the morning.  She will get followup Dopplers, ABIs and I will see me back in followup.  She  left the lab in stable condition.     Nanetta Batty, M.D.     JB/MEDQ  D:  09/06/2010  T:  09/07/2010  Job:  [id number]  cc:   [facility] Tera Mater. Clent Ridges, MD [facility] Angiographic Suite  Electronically Signed by Nanetta Batty M.D. on 09/11/2010 05:10:33 PM

## 2010-09-11 NOTE — Discharge Summary (Signed)
Sydney Phillips, HATCHER             ACCOUNT NO.:  [id number]  MEDICAL RECORD NO.:  [id number]           PATIENT TYPE:  O  LOCATION:  6531                         FACILITY:  [facility]  PHYSICIAN:  Nanetta Batty, M.D.   DATE OF BIRTH:  1935-10-25  DATE OF ADMISSION:  09/06/2010 DATE OF DISCHARGE:  09/07/2010                              DISCHARGE SUMMARY   DISCHARGE DIAGNOSES: 1. Lifestyle-limiting claudication. 2. Peripheral vascular disease with progression of disease in the     iliac arteries per Dopplers with ABIs in the 0.9 range, undergoing     percutaneous transluminal angioplasty and stent deployment to the     right common iliac artery for 80% stenosis. 3. Nonobstructive coronary artery disease. 4. Ischemic cardiomyopathy with ejection fraction of 20%.     a.     The patient biventricular implantable cardioverter-      defibrillator with improvement of ejection fraction of 40-45%.  DISCHARGE CONDITION:  Improved.  PROCEDURES:  Peripheral angiogram with PTA and stent deployment to the right common iliac artery by Dr. Allyson Sabal.  DISCHARGE MEDICATION:  See medication reconciliation sheet.  DISCHARGE INSTRUCTIONS: 1. Increase activity slowly.  May shower.  No lifting for 1 week.  No     driving for 2 days.  No sexual activity for 2 days. 2. Low-sodium heart-healthy diet. 3. Wash cath site with soap and water.  Call us if any bleeding,     swelling or drainage. 4. Follow up with Dr. Allyson Sabal on September 23, 2010 at 4:30 p.m. 5. You are scheduled for lower extremity arterial Dopplers on September 17, 2010 at 8 a.m.  HISTORY OF PRESENT ILLNESS:  A 75 year old mildly overweight white female with a history of ischemic cardiomyopathy with BiV ICD in place since 2009 with improvement of EF to 40-45%.  In addition, nonobstructive coronary disease with 75% proximal LAD and RCA lesions. She also has moderate internal carotid artery stenosis and high-grade left subclavian artery, she is  asymptomatic from.  She has hypertension and hyperlipidemia, and unfortunately continues to smoke 6 cigarettes a day.  She had seen Dr. Allyson Sabal in February and continued to complain of claudication which has increased.  Recent Dopplers prior to that visit show ABIs in the 0.9 range and needed to have peripheral angiography due to old lifestyle-limiting claudication, which she did undergo and was found to have an 80% stenosis in the right common iliac artery, undergoing PTA and stent deployment.  Tolerating the procedure well.  Please note prior to this hospitalization, she had a stress test, EF was 55% and it was a low risk again for ischemia.  A 2-D echo had also been done and EF was greater than 55% on the echo and the patient was stable. The patient was kept in the hospital overnight for observation.  She tolerated the night well without acute complications.  By the next morning, she was hypokalemic, potassium of 3.  She was replaced with potassium prior to her discharge.  Chemistry otherwise sodium 142, BUN 10, creatinine 0.92, glucose 104.  Hemoglobin 12.2, hematocrit 37.8, platelets 268, and WBC 7.2.  Vital signs  were stable.  Heart; regular rate and rhythm.  Lungs were clear.  Groin check was stable a 2+ right pedal pulse.  The patient's legs felt much better and she was stable, ambulated without complications and was discharged home.  She was also given a prescription for 14 days free of Plavix as this was her new medication.  The patient will follow up with Dr. Allyson Sabal as instructed.     Darcella Gasman. Annie Paras, N.P.   ______________________________ Nanetta Batty, M.D.    LRI/MEDQ  D:  09/07/2010  T:  09/08/2010  Job:  [id number]  cc:   Jeannett Senior A. Clent Ridges, MD  Electronically Signed by Nada Boozer N.P. on 09/10/2010 11:39:07 AM Electronically Signed by Nanetta Batty M.D. on 09/11/2010 05:10:35 PM

## 2010-09-19 ENCOUNTER — Other Ambulatory Visit: Payer: Self-pay | Admitting: Family Medicine

## 2010-09-24 ENCOUNTER — Telehealth: Payer: Self-pay | Admitting: *Deleted

## 2010-09-24 NOTE — Telephone Encounter (Signed)
Pt would like samples of Cymbalta 60 mg.

## 2010-09-24 NOTE — Telephone Encounter (Signed)
Please find her some samples

## 2010-09-25 NOTE — Telephone Encounter (Signed)
Samples cymbalta 60 mg  #28 lot [id number] A exp 03-2012 Left mess on  Phone samples at front desk

## 2010-10-14 ENCOUNTER — Telehealth: Payer: Self-pay | Admitting: Family Medicine

## 2010-10-14 NOTE — Telephone Encounter (Signed)
Requesting more samples of Cymbalta 60mg .

## 2010-10-15 NOTE — Telephone Encounter (Signed)
If no samples available please call rx into [facility] [phone]

## 2010-10-16 NOTE — Telephone Encounter (Signed)
Samples ready for p/u at front office. Pt informed.

## 2010-10-18 ENCOUNTER — Ambulatory Visit (INDEPENDENT_AMBULATORY_CARE_PROVIDER_SITE_OTHER): Payer: Medicare Other | Admitting: Family Medicine

## 2010-10-18 ENCOUNTER — Encounter: Payer: Self-pay | Admitting: Family Medicine

## 2010-10-18 VITALS — BP 160/80 | Temp 98.7°F | Wt 194.0 lb

## 2010-10-18 DIAGNOSIS — M545 Low back pain, unspecified: Secondary | ICD-10-CM

## 2010-10-18 DIAGNOSIS — R140 Abdominal distension (gaseous): Secondary | ICD-10-CM

## 2010-10-18 DIAGNOSIS — R141 Gas pain: Secondary | ICD-10-CM

## 2010-10-18 LAB — POCT URINALYSIS DIPSTICK
Ketones, UA: NEGATIVE
Leukocytes, UA: NEGATIVE
Protein, UA: NEGATIVE
Urobilinogen, UA: 0.2
pH, UA: 6

## 2010-10-18 MED ORDER — HYDROCODONE-ACETAMINOPHEN 10-660 MG PO TABS: 10.0000 mg | ORAL_TABLET | Freq: Four times a day (QID) | ORAL | Status: DC | PRN

## 2010-10-18 MED ORDER — DIAZEPAM 2 MG PO TABS: 2.0000 mg | ORAL_TABLET | Freq: Three times a day (TID) | ORAL | Status: DC | PRN

## 2010-10-18 NOTE — Progress Notes (Signed)
  Subjective:    Patient ID: Tomma Rakers, female    DOB: January 29, 1936, 75 y.o.   MRN: [id number]  HPI Here for 2 weeks of intermittent sensations of pressure or pain in the right hip area, the right lower back, and the right posterior thigh. She occasionally gets tingling or numbness or weakness in the right leg. No change in urinations or BMs. No fever. She had a stent placed in the right common iliac artery by Dr. Allyson Sabal on 09-06-10, and has done well after this.   Review of Systems  Respiratory: Negative.   Cardiovascular: Negative.   Gastrointestinal: Negative.   Genitourinary: Negative.   Musculoskeletal: Positive for back pain.  Neurological: Positive for weakness and numbness.       Objective:   Physical Exam  Constitutional: She appears well-developed and well-nourished.  Abdominal: Soft. Bowel sounds are normal. She exhibits no distension and no mass. There is no tenderness. There is no rebound and no guarding.  Genitourinary:       Rectal exam reveals no tenderness or masses. Stool is heme negative   Musculoskeletal:       She is tender in the right lower back and over the right sciatic notch . The hip shows full ROM           Assessment & Plan:  This seems to be from pinched nerves in her back. I suggested she see Dr. Jeral Fruit about this soon

## 2010-10-29 NOTE — Consult Note (Signed)
NAMEKEIRSTIN, Phillips             ACCOUNT NO.:  [id number]   MEDICAL RECORD NO.:  [id number]          PATIENT TYPE:  INP   LOCATION:  2901                         FACILITY:  [facility]   PHYSICIAN:  Duke Salvia, MD, FACCDATE OF BIRTH:  1935/07/19   DATE OF CONSULTATION:  12/01/2007  DATE OF DISCHARGE:                                 CONSULTATION   Thank you very much for asking [place] to see Sydney Phillips in consultation  with a wide complex tachycardia.   Sydney Phillips is a 75 year old lady who presented to the hospital with  chest discomfort, shortness of breath accompanied by a flutter in her  heart.  She has a longstanding history of dyspnea.  She is seen by Dr.  Sandrea Hughs for asthma and COPD.   She has daily episodes of tachy palpitations, which are provoked by  changes in position and by exertion.  Notably after she arrived in the  emergency room, was transferred from her stretcher to the CT scanner  (__________?).  There is no describing abrupt change in her heart rate  from the 80s to the 160s and gradually coming back down to the 110s.  The electrocardiograms that we have from the emergency room are all  consistent with supraventricular tachycardia and one which I read on the  computer last night, which I cannot find today is consistent with sinus  tachycardia with a clearly inscribed P-wave in the terminal portion of  the ST-T wave.   After arriving in hospital with the aforementioned symptoms, she  underwent CT scanning excluding pulmonary embolism.  Because of mildly  elevated troponin, she underwent catheterization demonstrating global  hypokinesis, nonlimiting stenoses in the LAD and RCA.  Myoview scanning  from Dr. Hazle Coca office was obtained and demonstrated a large anterior  wall scar.  This was dated 2004 with ejection fraction at that time was  41%, ejection fraction yesterday was 20% to 30%.   She has longstanding hypertension.  She has a history of left  subclavian  arterial stenosis.  She also carries a diagnosis of heart failure in the  past which is described as diastolic.   REVIEW OF SYSTEMS:  In addition to above, was notable for reflux  disease, nephrolithiasis, dyslipidemia, and fibromyalgia.   SOCIAL HISTORY:  She is married.  She continues to smoke.  She does not  use alcohol.   MEDICATIONS:  Her medications from home include:  1. Aspirin 81.  2. Simvastatin 80.  3. Lisinopril 20.  4. Xanax 1 q.i.d.  5. Etodolac 500 b.i.d.  6. Furosemide 40 b.i.d.  She is notably not on an ACE inhibitor.   ALLERGIES:  She is allergic to DARVOCET, ERYTHROMYCIN, and PENICILLIN.   On examination yesterday, she was a sleepy Caucasian female following  her catheterization.  She is in no acute distress.  Her blood pressure  at that time was 93/57, this morning is 122/61.  Her pulse was 71, her  respirations were 22, and oxygen saturation was 95.  Her HEENT exam  demonstrated no icterus.  No xanthomata.  Neck vein distention was not  appreciated.  The carotids were brisk bilaterally.  There was a bruit  over the right subclavian artery.  The lungs were listened to laterally  and anteriorly and they were clear.  Heart sounds were regular without  murmurs or gallops.  The abdomen was protuberant, but soft.  Bowel  sounds were active.  Extremities had no edema.  There was no clubbing or  cyanosis.  Neurological exam was notable for her being quite sleepy  following her catheterization.  The skin was warm and dry.   Electrocardiograms were notable for sinus rhythm at a rate of 83 with  intervals of 0.14/0.13/0.40.  There is significant ST-T changes.  Interestingly, after rates were associated with broader QRS, a more  typical left bundle-branch block pattern suggesting that the ST-T  changes might have been in T-wave memory phenomenon or repolarization  abnormality following left ventricular hypertrophy.   The electrocardiograms and the  tachycardia are all consistent with it  being supraventricular in origin based on QRS morphology to the anterior  precordium and similarities to sinus rhythm.  Furthermore in the  terminal portions of the T-wave in leads 2, all tracings demonstrated  bifid pattern consistent with a P-wave inscribed in the T-wave itself.   IMPRESSION:  1. Recurrent tachy palpitations likely related to sinus tachycardia      (possibly an atrial tachycardia) does not appear to be in a      reentrant rhythm.  2. Cardiomyopathy.      a.     Now known to be old with an anterior wall scar noted in       2004.      b.     Ejection fraction now at 20% to 30%  3. Chronic obstructive pulmonary disease/reactive airway disease.  4. Hypertension.  5. Rate-related left bundle.   DISCUSSION:  [name] has a cardiomyopathy with depressed left  ventricular function.  She has class III dyspnea, which is likely a  combination of congestive heart failure and her lung disease.  Her rate-  related left bundle certainly made the wide complex tachycardia very  concerning.   At this point, I would pursue aggressive medical therapy for her  cardiomyopathy using beta-blockers as tolerated.  We may well see  significant improvement in LV systolic function as well as reduction in  symptoms of dyspnea.  If after some period of time for resolution,  probably 8-12 weeks LV dysfunction persists and symptoms persist,  cardiopulmonary stress testing might be helpful to try to triage whether  a defibrillator or a CRT device would be most appropriate.   We will begin Coreg today following my discussions with Dr. Allyson Sabal.   Please let us know if there is anything that we can do further or  hospital to help.       Duke Salvia, MD, [facility]  Electronically Signed     SCK/MEDQ  D:  12/02/2007  T:  12/02/2007  Job:  [id number]   cc:   Nanetta Batty, M.D.

## 2010-10-29 NOTE — Assessment & Plan Note (Signed)
[facility]                             PULMONARY OFFICE NOTE   DAVIDA, FALCONI                    MRN:          [id number]  DATE:12/30/2006                            DOB:          25-Oct-1935    PULMONARY CONSULTATION   REQUESTING PHYSICIAN:  Tera Mater. Clent Ridges, MD   REASON FOR CONSULTATION:  Dyspnea.   HISTORY OF PRESENT ILLNESS:  This is a 75 year old white female seen  here previously  over three years ago for evaluation of atypical asthma  which was felt to be due to vocal cord dysfunction, either due to ACE  inhibitors, reflux or both.  She did have evidence of mild airflow  obstruction by PFT's with an FEV1 to [facility] ration of 69% that was  reversible with bronchodilators effective on study dated September 25, 2003  and up until about February of 2008, (when she lost her husband), she  stated she could just use Combivent p.r.n.  Since then, she is not only  using Combivent but has had Advair started by Dr. Clent Ridges with apparently no  improvement in her breathing which is bad just walking just maybe 50  feet.  She denies any associated chest pain, fevers, chills, sweats,  excess sputum production, orthopnea, PND or for that matter any  nocturnal respiratory disturbance.  She has noticed a dry cough that she  thinks Advair may have aggravated.  She denies any other obvious weather  or environmental triggers or alleviating factors for her breathing and  does not think Combivent works anymore like it used to.   PAST MEDICAL HISTORY:  Significant for hypertension but note she does  not report being on ACE inhibitors.  She also has carries a diagnosis of  hyperlipidemia and is status post appendectomy and hysterectomy.   ALLERGIES:  PENICILLIN, ERYTHROMYCIN, DARVON are all listed in our  records with nonspecific reactions.   MEDICATIONS:  Taken in detail on the work sheet, correct in the column  dated December 30, 2006.   SOCIAL HISTORY:  Notes that she  has resumed smoking after stopping in  2005 but says she only smokes three cigarettes a day.  She is retired  with no unusual travel or hobby exposure.   FAMILY HISTORY:  Significant for allergies in her sister, heart disease  in her mother.   REVIEW OF SYSTEMS:  Taken in detail on the work sheet and negative  except as above.   PHYSICAL EXAMINATION:  GENERAL APPEARANCE:  This is a somewhat depressed-  appearing elderly white female in no acute distress but with classic  pseudowheeze.  VITAL SIGNS:  She is afebrile with stable vital signs.  HEENT:  Oropharynx is clear with upper dentures.  Nasal turbinates  normal.  Ear canals clear.  NECK:  Supple without cervical adenopathy or tenderness.  Trachea is  midline.  There is no thyromegaly.  LUNG:  Fields were completely clear bilaterally to auscultation and  percussion except for the pseudowheeze as noted.  HEART:  Regular rate and rhythm without murmurs, rubs or gallops.  S1  and S2 are diminished.  No S3  or increased P2.  ABDOMEN: Soft, benign with normal excursion in the supine position.  EXTREMITIES:  Warm without calf tenderness, cyanosis, clubbing or edema.   CLINICAL DATA:  No recent x-rays or pulmonary function test are  available.   IMPRESSION:  This patient only had very mild airflow obstruction  presently and now has symptoms refractory to Advair.  One explanation  would be progression of chronic obstructive pulmonary disease over the  last three years since she resumed smoking but that would be a stretch  at age 18 to have such rapid progression over a three year period of  time without more convincing asthma.   I therefore suspect the same problem as was documented in the past,  namely vocal cord dysfunction syndrome, and recommended at this point  she stop Advair (the dry powder of Advair can actually aggravate vocal  cord dysfunction) and treat herself with Protonix 40 mg tablets  perfectly regular before breakfast  for the next six weeks before  returning here for pulmonary function tests.  If she is short of breath  she can certainly use Combivent two puffs every four hours p.r.n.  (I  reviewed with her optimal MDI technique for this).  I also reviewed a  GERD diet emphasizing the avoidance of mint and menthol products  (apparently she uses a lot of these) which can make GERD worse.   If her condition worsens off of Advair, I would certainly like to see  her back and consider empirically treating her with nebulizer that would  irritate the airway less and perhaps benefit the lower airway more but  don't think this will be necessary.     Charlaine Dalton. Sherene Sires, MD, [facility]  Electronically Signed    MBW/MedQ  DD: 12/30/2006  DT: 12/31/2006  Job #: [id number]   cc:   Jeannett Senior A. Clent Ridges, MD

## 2010-10-29 NOTE — Assessment & Plan Note (Signed)
[facility]                             PULMONARY OFFICE NOTE   Sydney, Larner Sydney Phillips                    MRN:          [id number]  DATE:01/15/2007                            DOB:          02/19/1936    This is a 75 year old white female patient of Dr. Thurston Hole that was  recently seen in the office for pulmonary consult for persistent  dyspnea. The patient had previously been seen in the office three years  ago for some atypical asthma felt secondary to vocal cord dysfunction,  either due to ACE inhibitors, reflux, or both. PFTs has had shown an  FEV1 to FVC ratio of 69% that was reversible with bronchodilators. On  the patient's last visit was recommended to stop Advair, use Protonix 40  mg, and may use Combivent as needed. The patient returns today reporting  that her symptoms have not improved and in fact, feels that she may be  somewhat worse off of Advair. Over the last several days, she has had  increased cough, congestion, and shortness of breath. The patient has  had some purulent sputum as well. The patient denies any hemoptysis,  orthopnea, PND, or leg swelling.   PAST MEDICAL HISTORY:  Reviewed.   CURRENT MEDICATIONS:  Reviewed.   PHYSICAL EXAMINATION:  GENERAL:  The patient is an elderly female in no  acute distress.  VITAL SIGNS:  Afebrile. Stable. O2 saturation is 93% on room air. Weight  is unchanged at 193.  HEENT:  Unremarkable.  NECK:  Supple without cervical adenopathy. No JVD.  LUNGS:  Reveal a few scattered rhonchi.  CARDIAC:  Regular rate and rhythm.  ABDOMEN:  Obese. Soft and nontender.  EXTREMITIES:  Warm without calf tenderness, cyanosis, clubbing, or  edema.   Chest x-ray reveals some possible vascular congestion and bronchitic  changes.   IMPRESSION AND PLAN:  1. Acute tracheobronchitis with possible component of volume overload.      The patient is to begin Avelox x7 days; add in Mucinex DM twice      daily. She  will increase Lasix up to 40 mg twice daily over the      next 3 days and then resume her 20 mg twice daily dose after that.      The patient will also begin on Pulmicort Respules 0.5mg  and Brovana      15 mcg twice daily via      nebulizer. She will return here in two weeks or sooner back with      Dr. Sherene Sires.  2. She is encouraged on smoking cessation.      Rubye Oaks, NP  Electronically Signed      Charlaine Dalton. Sherene Sires, MD, [facility]  Electronically Signed   TP/MedQ  DD: 01/15/2007  DT: 01/16/2007  Job #: [id number]

## 2010-10-29 NOTE — Assessment & Plan Note (Signed)
[facility]                             PULMONARY OFFICE NOTE   NAME:[place], UMI MAINOR                    MRN:          [id number]  DATE:02/10/2007                            DOB:          1936/04/11    HISTORY:  This is a 75 year old white female active smoker in for follow  up evaluation of chronic obstructive pulmonary disease with no  significant limiting complaints. She says that only when she gets a  real hurry that she is short of breath, but there are no nocturnal  symptoms or need for rescue therapy while maintained on a combination of  Pulmicort and Brovana for what is presumed to be chronic obstructive  pulmonary disease with an asthmatic component. She is still smoking, but  is down to 3 cigarettes per day. She denies any purulent sputum,  pleuritic pain, fevers, chills, sweats, orthopnea, PND, or leg swelling.   She comes in today after not actually taking her nebulizer yet for PFTs.   VITAL SIGNS:  Afebrile, stable.  HEENT:  Unremarkable. Oropharynx is clear.  LUNGS:  Fields clear bilaterally to auscultation and percussion.  ABDOMEN:  Soft and benign with a negative Hoover's sign.  CARDIAC:  Regular rate and rhythm with no murmurs, rubs, gallops or  increase of P2.  EXTREMITIES:  Warm without calf tenderness, cyanosis, clubbing, or  edema.   Heme saturation 94% on room air.   PFTs were reviewed with the patient and to my surprise, revealed an FEV1  of 90% predicted with a ratio of 69%. Diffusion capacity, however, is  59%, but corrects to 94% with the most striking abnormality being a  disproportionate reduction in expiratory reserve volume.   IMPRESSION:  This patient actually has more evidence of obesity  affecting lung function than chronic obstructive pulmonary disease by  the PFTs obtained today. Symptomatically, she has done much better on  Pulmicort and Brovana however, indicating that there is probably an  asthmatic  component that is being driven by smoking. If she can stop  smoking, it may well be that she can also stop in the long run, Brovana  and Pulmicort. Having responded in such a convincing fashion however, I  am going to recommend that she continue it.   I spent extra time with the patient going over her PFTs. I have arranged  to see her back in three months.   ADDENDUM:  Chart review indicates that the patient has increased marking  on chest x-ray which the radiologist read as CHF. However, the patient  has no history of hypertension, has no orthopnea or leg swelling, and I  believe that what we are seeing in review of the x-ray today is  increased interstitial markings associated with smoking such as one  might see in DIP or RBILB. This would go along with a reduction with  diffusion capacity, whereas CHF typically causes an increased diffusion  capacity. When she returns in three months we will need to repeat her  chest  x-ray, and also consider sedimentation rate and BNP to be added, but  since she is doing so  much better, I did not recommend repeat x-ray  today.     Charlaine Dalton. Sherene Sires, MD, [facility]  Electronically Signed    MBW/MedQ  DD: 02/10/2007  DT: 02/11/2007  Job #: [id number]   cc:   Jeannett Senior A. Clent Ridges, MD

## 2010-10-29 NOTE — Discharge Summary (Signed)
NAMEVERDENE, Sydney Phillips             ACCOUNT NO.:  [id number]   MEDICAL RECORD NO.:  [id number]          PATIENT TYPE:  INP   LOCATION:  2901                         FACILITY:  [facility]   PHYSICIAN:  Nanetta Batty, M.D.   DATE OF BIRTH:  Dec 14, 1935   DATE OF ADMISSION:  11/30/2007  DATE OF DISCHARGE:  12/03/2007                               DISCHARGE SUMMARY   DISCHARGE DIAGNOSES:  1. Unstable angina, catheterization this admission revealing moderate      two-vessel disease with 50%-70% left anterior descending and 60%-      75% right coronary artery at catheterization.  2. Cardiomyopathy, probably mixed ischemic and nonischemic with an      ejection fraction of 20%-25% and global hypokinesis.  3. Abnormal Myoview in 2004 suggesting anterior wall scarring which      was new compared to 2002 with an ejection fraction of 40% at that      time.  4. Long history of heavy smoking, she has recently cut back quite a      bit.  5. Asthmatic chronic obstructive pulmonary disease secondary to above.  6. Treated hypertension.  7. Treated dyslipidemia.  8. Wide complex tachycardia this admission, after electrophysiology      evaluation it was felt this was probably sinus tachycardia with      bundle branch block.  9. Left bundle branch block.  10.Peripheral vascular disease with prior left subclavian artery      stenosis and carotid disease which has been followed by Dr. Allyson Sabal.   HOSPITAL COURSE:  The patient is a 75 year old female followed by Dr.  Allyson Sabal and Dr. Sherene Sires with known carotid and subclavian disease.  She  presented on December 01, 2007 with substernal chest pain and shortness of  breath, worrisome for unstable angina.  The patient was admitted by Dr.  Elsie Lincoln through the emergency room.  The patient was started on  nitroglycerin and IV heparin.  Catheterization done at 10:30 a.m. on  December 01, 2007 revealed a 60%-70% RCA narrowing, normal circumflex,  normal left main, and 50%-70% LAD  narrowing prior to the takeoff of the  first diagonal.  CT angiogram done on the 17th showed no pulmonary  embolism, suggestion of emphysema and interstitial edema.  The patient's  EF was 20%-25% and globally hypokinetic and dilated.  She did develop  some wide complex tachycardia but tolerated this well.  She was seen in  consult by the EP service.  Ultimately, this was felt to be sinus  tachycardia with a left bundle branch block pattern.  Beta blockers were  recommended.  Plan is for outpatient followup of her LV function in a  couple months to decide whether she needs a ICD or not.  We did review  [name]' previous Myoview studies.  She had a Myoview in 2002 that  showed mild LV dilatation and breast attenuation with an EF of 53% and  then another Myoview in 2004 which showed moderate LV dilatation with  probable anterior wall scar with an EF of 40%.  Overall, it was a low-  risk study.  The patient  was kept in the CCU and ambulated.  Beta  blocker was added.  She is tolerating this.  We will have to be careful  about wheezing, as she does have a diagnosis of asthmatic COPD.  We feel  she can be discharged later on the 19th.   DISCHARGE MEDICATIONS:  1. Aspirin 81 mg a day.  2. Cyclobenzaprine 10 mg t.i.d. p.r.n.  3. Potassium 20 mEq once a day.  4. Etodolac 500 mg b.i.d.  5. Naprosyn p.r.n.  6. Vicodin 5/500 p.r.n.  7. Simvastatin 80 mg a day.  8. Lisinopril 20 mg a day.  9. Lasix 40 mg a day.  10.Coreg 3.125 mg twice daily.  11.Nitroglycerin sublingual p.r.n.   LABORATORY DATA:  D-dimer is 0.55.  BNP is 78.  CK-MB and troponins are  slightly elevated with a troponin of 0.19.  CK is 121 with an MB of 8.  Lipid panel shows a cholesterol 167, LDL 109, HDL 33, triglycerides 123.  LFTs were normal.  Sodium 137, potassium 4.4, BUN 16, creatinine 0.8.  White count 6.1, hemoglobin 12.6, hematocrit 37.8, platelets 214.  Chest  x-ray:  Cardiomegaly, mild interstitial edema on  admission.  INR is 1.0.  EKG shows sinus rhythm, left bundle branch block.   PLAN:  [name] will be ambulated today and discharged later on the  19th if she is doing well.  Dr. Allyson Sabal will see her back in 2 weeks.      Abelino Derrick, P.A.      Nanetta Batty, M.D.  Electronically Signed    LKK/MEDQ  D:  12/03/2007  T:  12/03/2007  Job:  [id number]   cc:   Charlaine Dalton. Sherene Sires, MD, FCCP

## 2010-11-01 NOTE — Op Note (Signed)
   NAMEKENNEDY, BRINES                       ACCOUNT NO.:  [id number]   MEDICAL RECORD NO.:  [id number]                   PATIENT TYPE:  AMB   LOCATION:  ENDO                                 FACILITY:  [facility]   PHYSICIAN:  Georgiana Spinner, M.D.                 DATE OF BIRTH:  1936/04/08   DATE OF PROCEDURE:  02/17/2002  DATE OF DISCHARGE:                                 OPERATIVE REPORT   PROCEDURE PERFORMED:  Colonoscopy.   ENDOSCOPIST:  Georgiana Spinner, M.D.   INDICATIONS FOR PROCEDURE:  Colon polyp.   ANESTHESIA:  None further given.   DESCRIPTION OF PROCEDURE:  With the patient mildly sedated in the left  lateral decubitus position, the Olympus video colonoscope was inserted in  the rectum and passed under direct vision to the cecum, identified by the  ileocecal valve and appendiceal orifice, both of which were photographed.  From this point the colonoscope was slowly withdrawn taking circumferential  views of the entire colonic mucosa stopping only in the rectum, where a  polyp was seen both on direct and retroflex view.  It was removed using  snare cautery technique setting of 20/20 blended current.  A second polyp  adjacent to it but slightly more distal was also removed using hot biopsy  forceps technique, again a setting of 20/20 blended current.  Both polyps  were retrieved for pathology.  The patient's vital signs and pulse oximeter  remained stable.  The patient tolerated the procedure well without apparent  complications.   FINDINGS:  Polyps of rectum, await biopsy report.  Patient will call me for  results and follow up with me as an outpatient.                                                Georgiana Spinner, M.D.    GMO/MEDQ  D:  02/17/2002  T:  02/18/2002  Job:  [id number]   cc:   Lacretia Leigh. Quintella Reichert, M.D.

## 2010-11-01 NOTE — Cardiovascular Report (Signed)
NAMEKASARA, SCHOMER             ACCOUNT NO.:  [id number]   MEDICAL RECORD NO.:  [id number]          PATIENT TYPE:  OIB   LOCATION:  2876                         FACILITY:  [facility]   PHYSICIAN:  Nanetta Batty, M.D.   DATE OF BIRTH:  March 17, 1936   DATE OF PROCEDURE:  DATE OF DISCHARGE:  05/20/2004                              CARDIAC CATHETERIZATION   CLINICAL HISTORY:  Ms. Sosnowski is a 75 year old female with a history of  hypertension, hyperlipidemia.  She has had a negative Cardiolite and  admission for congestive heart failure, pulmonary edema.  She does have  claudication with Dopplers which show high grade bilateral iliac disease.  She also has retrograde left vertebral flow by carotid duplex.  She presents  now for angiography, potential intervention.   PROCEDURE DESRIPTION:  The patient was brought to the sixth floor [facility] in the postabsorptive state.  She was  premedicated with p.o. Valium.  Her right groin was prepped and shaved in  the usual sterile fashion.  1% Xylocaine was used for local anesthesia.  A 5-  French sheath was inserted into the right femoral artery using standard  Seldinger technique.  5-French tennis racquet catheter and long right  Judkins catheter was used for arch angiography, distal abdominal aortography  with bifemoral runoff, selective right innominate and left subclavian  angiography.  Pullback gradient was performed across the origin of the right  common iliac artery.  Visipaque dye was used for the entirety of the case.  Retrograde aortic pressure was monitored during the case.   ANGIOGRAPHIC RESULTS:  1.  Arch aortogram:  Type 1 arch.  2.  Left subclavian artery:  90% proximal with retrograde filling of the      left vert and competitive filling of the IMA.  3.  Right innominate:  Widely patent and right vert with antegrade filling.      There is no evidence of delayed retrograde filling of the left  vert.  4.  Abdominal aortogram:      1.  Renal arteries:  Normal.      2.  Infrarenal abdominal aorta:  Mild to moderate atherosclerosis.  5.  Left lower extremity:  Normal.  6.  Right lower extremity:  30-40% ostial right common iliac artery stenosis      without a pullback gradient.  There is two vessel runoff below the knee      bilaterally.   IMPRESSION:  Ms. Blumer has no evidence of hemodynamically significant  lesions in the aortoiliac or infrainguinal vasculature.  Patient does have a  high grade left subclavian artery stenosis, though she is asymptomatic from  this.  Plans will be for continued medical therapy.   The sheaths were removed and pressure was held on the groin to achieve  hemostasis.  Patient left the laboratory in stable condition.  She will be  discharged later today after remaining recumbent for five hours and being  hydrated.  She will see me back in the office in two weeks for follow-up.  She left the laboratory in stable condition.  JB/MEDQ  D:  05/20/2004  T:  05/21/2004  Job:  [id number]   cc:   [facility]   Tera Mater. Clent Ridges, M.D. [facility]

## 2010-11-01 NOTE — Op Note (Signed)
   Sydney Phillips, Sydney Phillips                       ACCOUNT NO.:  [id number]   MEDICAL RECORD NO.:  [id number]                   PATIENT TYPE:  AMB   LOCATION:  ENDO                                 FACILITY:  [facility]   PHYSICIAN:  Georgiana Spinner, M.D.                 DATE OF BIRTH:  1935/07/19   DATE OF PROCEDURE:  02/17/2002  DATE OF DISCHARGE:                                 OPERATIVE REPORT   PREOPERATIVE DIAGNOSIS:  Upper endoscopy.   INDICATIONS:  Gastroesophageal reflux disease.   ANESTHESIA:  Demerol 100 and Versed 8 mg.   PROCEDURE:  With the patient mildly sedated, in the left lateral decubitus  position, the Olympus videoscopic endoscope was inserted into the mouth and  passed under direct vision through the esophagus which appeared normal into  the stomach.  The fundus, body, antrum, duodenal bulb and second portion of  the duodenum all appeared normal.  From this point the endoscope was slowly  withdrawn taking circumflex views of the entire duodenal mucosa.  The  endoscope was then pulled back into the stomach and placed in retroflex to  review the stomach from below.  The endoscope was then straightened and  withdrawn taking circumflex views of the remaining gastric and esophageal  mucosa.  The patient's vital signs and pulse oximetry remained stable.  The  patient tolerated the procedure well without apparent complications.   FINDINGS:  This is an unremarkable endoscopic examination.   PLAN:  Proceed to colonoscopy.                                               Georgiana Spinner, M.D.    GMO/MEDQ  D:  02/17/2002  T:  02/18/2002  Job:  [id number]   cc:   Lacretia Leigh. Quintella Reichert, M.D.

## 2010-11-01 NOTE — H&P (Signed)
Sydney Phillips, Sydney Phillips             ACCOUNT NO.:  [id number]   MEDICAL RECORD NO.:  [id number]          PATIENT TYPE:  OIB   LOCATION:  NA                           FACILITY:  [facility]   PHYSICIAN:  Nanetta Batty, M.D.   DATE OF BIRTH:  [birth year]   DATE OF ADMISSION:  DATE OF DISCHARGE:                                HISTORY & PHYSICAL   CHIEF COMPLAINT:  Presents now for peripheral angiography secondary to  abnormal Duplex and claudication symptoms.   HISTORY OF PRESENT ILLNESS:  Sydney Phillips is a 75 year old white female with  a history of hypertension and hyperlipidemia.  As well, in the past she was  noted to have a right carotid bruit and we have been following this by  Duplex scanning.   As well, she was initially seen by our practice back in 2002 at which time  she was having shortness of breath.  She had a Cardiolite scan at that time  and has had another follow-up Cardiolite scan, the last one being October  2004.  At that point there was a question of a scar versus breast  attenuation, EF 41%.   She was seen in our office Nov 03, 2003.  At that time she was seen as a  work-in to the schedule after a recent visit to the emergency room.  At the  time of her ER visit, she had presented with extreme shortness of breath and  was diagnosed with CHF and pulmonary edema.  However, it was noted in the ER  that her systolic blood pressure had gone up to 198.   Nonetheless, her Lasix was increased to 80 mg b.i.d. at that point and she  continued on that dose.  it was presumed that the symptoms were secondary to  diastolic heart failure from hypertension.  In addition to following her  carotid Duplexes, we had also been performing lower extremity Dopplers to  follow up lower extremity claudication symptoms.   Her last lower extremity Doppler was done April 09, 2004, and showed  significant right CIA stenosis of greater than 70% stenosis.  Sydney Phillips  did report having  significant pain in her left region.  Dr. Allyson Sabal then  discussed preceding with angiography with her and she wished to proceed.  She felt that the pain significantly impacted her quality of life.   CURRENT MEDICATIONS:  1.  Aspirin 81 mg a day.  2.  Vytorin 10/80 mg q.h.s.  3.  Niaspan 500 mg q.h.s.  4.  Combivent inhaler p.r.n.  5.  Benicar 20 mg a day.  6.  Zoloft 100 mg a day.  7.  Nexium 40 mg b.i.d.  8.  Potassium chloride 20 mEq a day.  9.  Plavix 75 mg a day.  10. Alprazolam 1 mg q.i.d. p.r.n.  11. Furosemide 80mg  b.i.d.   ALLERGIES:  1.  PENICILLIN.  2.  CODEINE.  3.  MYCINS.  4.  DARVON.   SOCIAL HISTORY:  She is married with three children and three grandchildren.   FAMILY HISTORY:  Noncontributory.   REVIEW OF SYMPTOMS:  GENERAL:  Positive  for some fatigue.  MUSCULOSKELETAL:  Positive for leg pain on walking.  RESPIRATORY:  Positive for asthma.  ENDOCRINE:  No diabetes.  No thyroid disease.  NEUROLOGIC:  No  lightheadedness, no dizziness.  Other systems negative.   PHYSICAL EXAMINATION:  GENERAL APPEARANCE:  An obese white female who  appears in no acute distress.  VITAL SIGNS:  Weight 197, height 5 feet 7 inches.  Heart rate 98, blood  pressure 128/62.  NECK:  Supple.  LUNGS:  Clear.  CARDIOVASCULAR:  Regular rhythm without murmurs, rubs, or gallops.  EXTREMITIES:  No extremity edema.   IMPRESSION AND PLAN:  At this point, Dr. Allyson Sabal had reviewed her lower  extremity Dopplers and also her claudication symptoms.  We have reviewed  preceding with peripheral angiography.  Risks and benefits of the procedure  have been discussed and she was agreeable to proceed.  She now presents for  PV angiogram by Dr. Nanetta Batty.   Note that Dr. Nanetta Batty has seen and evaluated the patient and agrees  with the above.      MBE/MEDQ  D:  05/20/2004  T:  05/20/2004  Job:  [id number]

## 2010-11-14 ENCOUNTER — Telehealth: Payer: Self-pay | Admitting: Family Medicine

## 2010-11-14 NOTE — Telephone Encounter (Signed)
Pt called back again re: Cymbalta samples and to see what to do about problem with Diazepam. Pls call asap.

## 2010-11-14 NOTE — Telephone Encounter (Signed)
Pt req samples of Cymbalta 60 mg. Pt completely out of med. Also pt says that the Diazepam 2 mg, is keeping her awake at night and jittery. Pt is wondering if this med dose is too strong or not strong enough. Pt says that xanax 1 mg, calms her down.

## 2010-11-15 NOTE — Telephone Encounter (Signed)
Pt call back checking on status of cymbalta and problems with diazepam.

## 2010-11-17 NOTE — Telephone Encounter (Signed)
Please find her samples for Cymbalta. Call her pharmacy and DC the order for Diazepam. Instead call in Xanax 1 mg 4 times a day prn anxiety, #120 with 5 rf

## 2010-11-18 MED ORDER — ALPRAZOLAM 1 MG PO TABS: 1.0000 mg | ORAL_TABLET | Freq: Four times a day (QID) | ORAL | Status: DC | PRN

## 2010-11-18 NOTE — Telephone Encounter (Signed)
Please call pt and advise if we have cymbalta samples and let her know when rx has been called in for Xanax.

## 2010-11-18 NOTE — Telephone Encounter (Signed)
Call placed to patient at [phone], she was informed Rx sent to pharmacy, and samples of Cymbalta left at front desk for patient pick up

## 2010-12-02 ENCOUNTER — Ambulatory Visit (INDEPENDENT_AMBULATORY_CARE_PROVIDER_SITE_OTHER): Payer: Medicare Other | Admitting: Family Medicine

## 2010-12-02 ENCOUNTER — Encounter: Payer: Self-pay | Admitting: Family Medicine

## 2010-12-02 VITALS — BP 152/70 | HR 107 | Temp 98.4°F | Ht 66.0 in | Wt 192.0 lb

## 2010-12-02 DIAGNOSIS — R5383 Other fatigue: Secondary | ICD-10-CM

## 2010-12-02 DIAGNOSIS — M545 Low back pain: Secondary | ICD-10-CM

## 2010-12-02 DIAGNOSIS — R531 Weakness: Secondary | ICD-10-CM

## 2010-12-02 DIAGNOSIS — IMO0001 Reserved for inherently not codable concepts without codable children: Secondary | ICD-10-CM

## 2010-12-02 DIAGNOSIS — M797 Fibromyalgia: Secondary | ICD-10-CM

## 2010-12-02 DIAGNOSIS — F329 Major depressive disorder, single episode, unspecified: Secondary | ICD-10-CM

## 2010-12-02 NOTE — Progress Notes (Signed)
  Subjective:    Patient ID: Sydney Phillips, female    DOB: [birth year], 75 y.o.   MRN: [id number]  HPI Here for fibromyalgia pains and low back pain. She finally got an appt to see Dr. Jeral Fruit on 12-23-10. Her generalized muscle aches and weakness seem to be getting worse. No SOB or chest pains.    Review of Systems  Constitutional: Positive for fatigue.  Respiratory: Negative.   Cardiovascular: Negative.   Musculoskeletal: Positive for myalgias.  Neurological: Positive for weakness.       Objective:   Physical Exam  Constitutional: She appears well-developed and well-nourished.  Cardiovascular: Normal rate, regular rhythm, normal heart sounds and intact distal pulses.   Pulmonary/Chest: Effort normal and breath sounds normal.          Assessment & Plan:  Check labs today. See Dr. Jeral Fruit.

## 2010-12-03 LAB — CBC WITH DIFFERENTIAL/PLATELET
Basophils Relative: 1 % (ref 0–1)
Eosinophils Absolute: 0.2 10*3/uL (ref 0.0–0.7)
HCT: 41.1 % (ref 36.0–46.0)
Hemoglobin: 13.2 g/dL (ref 12.0–15.0)
MCH: 27.3 pg (ref 26.0–34.0)
MCHC: 32.1 g/dL (ref 30.0–36.0)
Monocytes Absolute: 0.8 10*3/uL (ref 0.1–1.0)
Monocytes Relative: 9 % (ref 3–12)
Neutrophils Relative %: 60 % (ref 43–77)

## 2010-12-03 LAB — BASIC METABOLIC PANEL
CO2: 32 mEq/L (ref 19–32)
Calcium: 9.7 mg/dL (ref 8.4–10.5)
Creat: 0.97 mg/dL (ref 0.50–1.10)
Sodium: 141 mEq/L (ref 135–145)

## 2010-12-03 LAB — SEDIMENTATION RATE: Sed Rate: 8 mm/hr (ref 0–22)

## 2010-12-03 LAB — HEPATIC FUNCTION PANEL
ALT: 13 U/L (ref 0–35)
AST: 16 U/L (ref 0–37)
Bilirubin, Direct: <0.1 mg/dL (ref 0.0–0.3)
Total Protein: 7.2 g/dL (ref 6.0–8.3)

## 2010-12-03 LAB — TSH: TSH: 1.025 u[IU]/mL (ref 0.350–4.500)

## 2010-12-06 ENCOUNTER — Telehealth: Payer: Self-pay | Admitting: *Deleted

## 2010-12-06 MED ORDER — POTASSIUM CHLORIDE CRYS ER 20 MEQ PO TBCR: 20.0000 meq | EXTENDED_RELEASE_TABLET | Freq: Three times a day (TID) | ORAL | Status: DC

## 2010-12-06 MED ORDER — ERGOCALCIFEROL 1.25 MG (50000 UT) PO CAPS: 50000.0000 [IU] | ORAL_CAPSULE | ORAL | Status: DC

## 2010-12-06 NOTE — Telephone Encounter (Signed)
Message copied by Romualdo Bolk on Fri Dec 06, 2010 11:16 AM ------      Message from: Gershon Crane A      Created: Thu Dec 05, 2010  9:01 PM       Her labs show a low potassium level and a low vitamin D level, both of which can cause muscle aches. Increase her klor-con 20 mEq to tid, call in #90 with 11 rf. Also call in vitamin D 50,000 unit capsules to take once a week for 12 weeks. Recheck both levels in 90 days

## 2010-12-06 NOTE — Telephone Encounter (Signed)
Left message to call back  

## 2010-12-06 NOTE — Telephone Encounter (Signed)
Pt aware and appt made.

## 2010-12-26 ENCOUNTER — Other Ambulatory Visit: Payer: Self-pay | Admitting: Neurosurgery

## 2010-12-26 DIAGNOSIS — M5410 Radiculopathy, site unspecified: Secondary | ICD-10-CM

## 2010-12-26 DIAGNOSIS — M549 Dorsalgia, unspecified: Secondary | ICD-10-CM

## 2011-01-01 ENCOUNTER — Other Ambulatory Visit: Payer: Medicare Other

## 2011-01-01 ENCOUNTER — Ambulatory Visit
Admission: RE | Admit: 2011-01-01 | Discharge: 2011-01-01 | Disposition: A | Discharge status: Home / Self Care | Payer: Medicare Other | Source: Ambulatory Visit | Attending: Neurosurgery | Admitting: Neurosurgery

## 2011-01-01 VITALS — BP 154/75 | HR 74

## 2011-01-01 DIAGNOSIS — M549 Dorsalgia, unspecified: Secondary | ICD-10-CM

## 2011-01-01 DIAGNOSIS — M545 Low back pain: Secondary | ICD-10-CM

## 2011-01-01 DIAGNOSIS — M5410 Radiculopathy, site unspecified: Secondary | ICD-10-CM

## 2011-01-01 DIAGNOSIS — M4800 Spinal stenosis, site unspecified: Secondary | ICD-10-CM

## 2011-01-01 MED ORDER — IOHEXOL 180 MG/ML  SOLN: 20.0000 mL | Freq: Once | INTRAMUSCULAR | Status: AC | PRN

## 2011-01-01 MED ORDER — DIAZEPAM 2 MG PO TABS
5.0000 mg | ORAL_TABLET | Freq: Once | ORAL | Status: AC
Start: 2011-01-01 — End: 2011-01-01
  Administered 2011-01-01: 5 mg via ORAL

## 2011-01-01 MED ORDER — IOHEXOL 180 MG/ML  SOLN
20.0000 mL | Freq: Once | INTRAMUSCULAR | Status: AC | PRN
  Administered 2011-01-01: 20 mL via INTRATHECAL

## 2011-01-01 MED ORDER — HYDROCODONE-ACETAMINOPHEN 5-325 MG PO TABS
1.0000 | ORAL_TABLET | Freq: Once | ORAL | Status: AC
  Administered 2011-01-01: 1 via ORAL

## 2011-01-01 MED ORDER — DEXTROSE-NACL 5-0.45 % IV SOLN: INTRAVENOUS | Status: DC

## 2011-01-01 NOTE — Progress Notes (Signed)
Pt states she has been off Plavix for at least five days and Cymbalta for at least two days.  jkl

## 2011-01-06 ENCOUNTER — Other Ambulatory Visit: Payer: Self-pay | Admitting: Family Medicine

## 2011-01-09 ENCOUNTER — Other Ambulatory Visit: Payer: Self-pay | Admitting: Family Medicine

## 2011-01-09 NOTE — Telephone Encounter (Signed)
Script sent, e-scribe. 

## 2011-01-20 ENCOUNTER — Ambulatory Visit (INDEPENDENT_AMBULATORY_CARE_PROVIDER_SITE_OTHER): Payer: Medicare Other | Admitting: Family Medicine

## 2011-01-20 ENCOUNTER — Encounter: Payer: Self-pay | Admitting: Family Medicine

## 2011-01-20 VITALS — BP 120/80 | HR 101 | Temp 98.4°F | Wt 190.0 lb

## 2011-01-20 DIAGNOSIS — F32A Depression, unspecified: Secondary | ICD-10-CM

## 2011-01-20 DIAGNOSIS — M545 Low back pain, unspecified: Secondary | ICD-10-CM

## 2011-01-20 DIAGNOSIS — F329 Major depressive disorder, single episode, unspecified: Secondary | ICD-10-CM

## 2011-01-20 DIAGNOSIS — M797 Fibromyalgia: Secondary | ICD-10-CM

## 2011-01-20 DIAGNOSIS — IMO0001 Reserved for inherently not codable concepts without codable children: Secondary | ICD-10-CM

## 2011-01-20 MED ORDER — GABAPENTIN 100 MG PO CAPS: 100.0000 mg | ORAL_CAPSULE | Freq: Two times a day (BID) | ORAL | Status: DC

## 2011-01-20 NOTE — Progress Notes (Signed)
  Subjective:    Patient ID: Sydney Phillips, female    DOB: 05/24/1936, 75 y.o.   MRN: [id number]  HPI Here for advice about her fibromyalgia pains. She was recently started on Gabapentin by Dr. Jeral Fruit for trunk and back pains, but she was started on 300 mg tid. She took this for 2 days but then had to stop it due to feeling very sleepy, dizzy, and confused.    Review of Systems  Respiratory: Negative.   Cardiovascular: Negative.   Musculoskeletal: Positive for back pain.  Neurological: Positive for dizziness, weakness and light-headedness. Negative for tremors, seizures, syncope and speech difficulty.  Psychiatric/Behavioral: Positive for dysphoric mood.       Objective:   Physical Exam  Constitutional: She is oriented to person, place, and time. She appears well-developed and well-nourished.       Walks with a walker   Neurological: She is alert and oriented to person, place, and time. No cranial nerve deficit. She exhibits normal muscle tone. Coordination normal.          Assessment & Plan:  I think Gabapentin is an excellent choice of adjuvant therapy for her back pain and fibromyalgia pain, but she was started on too high a dose of this. We will decrease it to 100 mg bid for 2 weeks or so, and then slowly titrate upward.

## 2011-01-31 ENCOUNTER — Telehealth: Payer: Self-pay | Admitting: Family Medicine

## 2011-01-31 NOTE — Telephone Encounter (Signed)
Refill request for Alprazolam 1 mg take 1 po qid. Pt last here on 01/20/11 and script last filled on 12/26/10.

## 2011-02-04 MED ORDER — ALPRAZOLAM 1 MG PO TABS: 1.0000 mg | ORAL_TABLET | Freq: Four times a day (QID) | ORAL | Status: DC | PRN

## 2011-02-04 NOTE — Telephone Encounter (Signed)
Script sent e-scribe 

## 2011-02-04 NOTE — Telephone Encounter (Signed)
Call in #120 with 5 rf 

## 2011-02-18 ENCOUNTER — Other Ambulatory Visit: Payer: Self-pay | Admitting: Family Medicine

## 2011-03-13 LAB — CARDIAC PANEL(CRET KIN+CKTOT+MB+TROPI)
CK, MB: 4.2 — ABNORMAL HIGH
CK, MB: 4.4 — ABNORMAL HIGH
CK, MB: 8.3 — ABNORMAL HIGH
Relative Index: INVALID
Relative Index: INVALID
Relative Index: INVALID
Total CK: 121
Total CK: 74
Total CK: 82
Total CK: 92
Troponin I: 0.06
Troponin I: 0.14 — ABNORMAL HIGH

## 2011-03-13 LAB — COMPREHENSIVE METABOLIC PANEL
AST: 22
BUN: 14
CO2: 26
Calcium: 9.1
Chloride: 103
Creatinine, Ser: 1.15
GFR calc non Af Amer: 47 — ABNORMAL LOW
Glucose, Bld: 106 — ABNORMAL HIGH
Total Bilirubin: 0.5

## 2011-03-13 LAB — DIFFERENTIAL
Basophils Absolute: 0.1
Eosinophils Relative: 5
Lymphocytes Relative: 36
Monocytes Absolute: 0.7
Monocytes Relative: 7
Neutro Abs: 5

## 2011-03-13 LAB — APTT: aPTT: 32

## 2011-03-13 LAB — CBC
HCT: 37.8
HCT: 43.7
Hemoglobin: 12.6
Hemoglobin: 14.7
MCHC: 33.7
Platelets: 214
RBC: 4.41
RBC: 4.89
RDW: 13.5
WBC: 6.1
WBC: 7

## 2011-03-13 LAB — B-NATRIURETIC PEPTIDE (CONVERTED LAB): Pro B Natriuretic peptide (BNP): 78

## 2011-03-13 LAB — TROPONIN I: Troponin I: 0.02

## 2011-03-13 LAB — LIPID PANEL
Cholesterol: 167
Total CHOL/HDL Ratio: 5.1

## 2011-03-13 LAB — POCT CARDIAC MARKERS
CKMB, poc: 4.4
Operator id: 277751
Troponin i, poc: <0.05

## 2011-03-13 LAB — LIPASE, BLOOD: Lipase: 27

## 2011-03-13 LAB — BASIC METABOLIC PANEL
BUN: 16
CO2: 31
Chloride: 100
Chloride: 103
Creatinine, Ser: 0.89
GFR calc Af Amer: >60
Glucose, Bld: 95
Potassium: 4.4
Sodium: 139

## 2011-03-13 LAB — POCT I-STAT, CHEM 8
BUN: 17
Calcium, Ion: 1.12
HCT: 45
Hemoglobin: 15.3 — ABNORMAL HIGH
Sodium: 139
TCO2: 26

## 2011-03-13 LAB — CK TOTAL AND CKMB (NOT AT ARMC): Total CK: 174

## 2011-03-13 LAB — D-DIMER, QUANTITATIVE: D-Dimer, Quant: 0.55 — ABNORMAL HIGH

## 2011-03-13 LAB — HEPARIN LEVEL (UNFRACTIONATED): Heparin Unfractionated: <0.1 — ABNORMAL LOW

## 2011-04-15 ENCOUNTER — Telehealth: Payer: Self-pay | Admitting: Family Medicine

## 2011-04-15 NOTE — Telephone Encounter (Signed)
Pt said she mailed an envelope for a handy cap tag last week and wanted to know if it had been received. Please contact pt

## 2011-04-16 NOTE — Telephone Encounter (Signed)
I have taken care of this

## 2011-04-22 ENCOUNTER — Other Ambulatory Visit: Payer: Self-pay | Admitting: Family Medicine

## 2011-04-22 NOTE — Telephone Encounter (Signed)
Pt need cymbalta samples asap.

## 2011-04-24 NOTE — Telephone Encounter (Signed)
Pt is aware.  

## 2011-04-24 NOTE — Telephone Encounter (Signed)
Samples are ready at your desk

## 2011-05-07 ENCOUNTER — Other Ambulatory Visit: Payer: Self-pay | Admitting: Family Medicine

## 2011-05-07 NOTE — Telephone Encounter (Signed)
Script called in

## 2011-05-07 NOTE — Telephone Encounter (Signed)
Call in #120 with 5 rf 

## 2011-05-26 ENCOUNTER — Telehealth: Payer: Self-pay | Admitting: Family Medicine

## 2011-05-26 NOTE — Telephone Encounter (Signed)
Pt need samples of cymbalta

## 2011-05-27 NOTE — Telephone Encounter (Signed)
Okay to give her 60 mg samples if we can

## 2011-05-27 NOTE — Telephone Encounter (Signed)
Pt called again and is requesting to be contacted when ready for pick up

## 2011-05-28 MED ORDER — DULOXETINE HCL 60 MG PO CPEP: 60.0000 mg | ORAL_CAPSULE | Freq: Two times a day (BID) | ORAL | Status: DC

## 2011-05-28 NOTE — Telephone Encounter (Signed)
Script sent e-scribe and approved by Dr. Fry. 

## 2011-05-28 NOTE — Telephone Encounter (Signed)
No samples here, spoke with pt and she wants script called in to [facility] here in [place].

## 2011-06-03 ENCOUNTER — Encounter: Payer: Self-pay | Admitting: Family Medicine

## 2011-06-03 ENCOUNTER — Ambulatory Visit (INDEPENDENT_AMBULATORY_CARE_PROVIDER_SITE_OTHER): Payer: Medicare Other | Admitting: Family Medicine

## 2011-06-03 VITALS — BP 136/84 | HR 100 | Temp 98.7°F | Wt 188.0 lb

## 2011-06-03 DIAGNOSIS — F329 Major depressive disorder, single episode, unspecified: Secondary | ICD-10-CM

## 2011-06-03 DIAGNOSIS — I10 Essential (primary) hypertension: Secondary | ICD-10-CM

## 2011-06-03 DIAGNOSIS — Z23 Encounter for immunization: Secondary | ICD-10-CM

## 2011-06-03 DIAGNOSIS — R350 Frequency of micturition: Secondary | ICD-10-CM

## 2011-06-03 LAB — POCT URINALYSIS DIPSTICK
Protein, UA: NEGATIVE
Spec Grav, UA: 1.01
Urobilinogen, UA: 0.2
pH, UA: 6

## 2011-06-03 NOTE — Progress Notes (Signed)
  Subjective:    Patient ID: Sydney Phillips, female    DOB: 09-18-1935, 75 y.o.   MRN: [id number]  HPI Here to check her urine since she has had some urgency for several weeks. No burning or fever. Also asking for samples of Cymbalta. She still works with her depression but is doing reasonably well. She is spending time with family over the holidays.    Review of Systems  Constitutional: Negative.   Respiratory: Negative.   Cardiovascular: Negative.   Gastrointestinal: Negative.   Genitourinary: Positive for urgency and frequency.       Objective:   Physical Exam  Constitutional: She appears well-developed and well-nourished.  Cardiovascular: Normal rate, regular rhythm, normal heart sounds and intact distal pulses.   Pulmonary/Chest: Effort normal and breath sounds normal.          Assessment & Plan:  She needs to drink more water. Given samples of Cymbalta. Given a flu shot.

## 2011-06-16 ENCOUNTER — Ambulatory Visit: Payer: Medicare Other | Admitting: Family Medicine

## 2011-07-02 ENCOUNTER — Other Ambulatory Visit: Payer: Self-pay | Admitting: Family Medicine

## 2011-07-02 NOTE — Telephone Encounter (Signed)
Pt need samples of cymbalta 60 mg

## 2011-07-03 NOTE — Telephone Encounter (Signed)
Samples up front. Pt aware of this. 

## 2011-07-03 NOTE — Telephone Encounter (Signed)
Please see if we have any

## 2011-07-11 ENCOUNTER — Ambulatory Visit: Payer: Medicare Other | Admitting: Family Medicine

## 2011-07-11 ENCOUNTER — Telehealth: Payer: Self-pay | Admitting: Family Medicine

## 2011-07-11 MED ORDER — HYDROCODONE-HOMATROPINE 5-1.5 MG/5ML PO SYRP: 5.0000 mL | ORAL_SOLUTION | Freq: Three times a day (TID) | ORAL | Status: DC | PRN

## 2011-07-11 MED ORDER — HYDROCODONE-HOMATROPINE 5-1.5 MG/5ML PO SYRP: 5.0000 mL | ORAL_SOLUTION | ORAL | Status: AC | PRN

## 2011-07-11 MED ORDER — AZITHROMYCIN 250 MG PO TABS: ORAL_TABLET | ORAL | Status: DC

## 2011-07-11 NOTE — Telephone Encounter (Signed)
Call in a Zpack and also a 240 ml bottle of Hydromet, 5 ml q 4 hours prn cough

## 2011-07-11 NOTE — Telephone Encounter (Signed)
Pt had ov sch for this afternoon but due to inclement weather pt is unable to come in. Pt is req a med for cough, drainage, and chest congestion to be called in to [facility] on [address].

## 2011-07-14 ENCOUNTER — Ambulatory Visit (INDEPENDENT_AMBULATORY_CARE_PROVIDER_SITE_OTHER): Payer: Medicare Other | Admitting: Family Medicine

## 2011-07-14 ENCOUNTER — Encounter: Payer: Self-pay | Admitting: Family Medicine

## 2011-07-14 VITALS — BP 140/84 | HR 99 | Temp 98.4°F | Wt 194.0 lb

## 2011-07-14 DIAGNOSIS — J40 Bronchitis, not specified as acute or chronic: Secondary | ICD-10-CM

## 2011-07-14 MED ORDER — METHYLPREDNISOLONE ACETATE 80 MG/ML IJ SUSP
120.0000 mg | Freq: Once | INTRAMUSCULAR | Status: AC
  Administered 2011-07-14: 120 mg via INTRAMUSCULAR

## 2011-07-14 MED ORDER — DOXYCYCLINE HYCLATE 100 MG PO CAPS: 100.0000 mg | ORAL_CAPSULE | Freq: Two times a day (BID) | ORAL | Status: AC

## 2011-07-14 NOTE — Telephone Encounter (Signed)
Scripts called in and I spoke with pt.

## 2011-07-14 NOTE — Progress Notes (Signed)
  Subjective:    Patient ID: Sydney Phillips, female    DOB: 10-22-1935, 76 y.o.   MRN: [id number]  HPI Here for 3 weeks of chest congestion and coughing up yellow sputum. No fever. Using Delsym    Review of Systems  Constitutional: Negative.   HENT: Negative.   Eyes: Negative.   Respiratory: Positive for cough and wheezing.   Cardiovascular: Negative for chest pain.       Objective:   Physical Exam  Constitutional: She appears well-developed and well-nourished.  HENT:  Right Ear: External ear normal.  Left Ear: External ear normal.  Nose: Nose normal.  Mouth/Throat: Oropharynx is clear and moist. No oropharyngeal exudate.  Eyes: Conjunctivae are normal.  Pulmonary/Chest: Effort normal. She has no rales.       Diffuse rhonchi and wheezing  Lymphadenopathy:    She has no cervical adenopathy.          Assessment & Plan:  Recheck prn

## 2011-07-21 ENCOUNTER — Ambulatory Visit (INDEPENDENT_AMBULATORY_CARE_PROVIDER_SITE_OTHER): Payer: Medicare Other | Admitting: Family Medicine

## 2011-07-21 ENCOUNTER — Encounter: Payer: Self-pay | Admitting: Family Medicine

## 2011-07-21 VITALS — BP 144/86 | HR 82 | Temp 98.9°F | Wt 194.0 lb

## 2011-07-21 DIAGNOSIS — R42 Dizziness and giddiness: Secondary | ICD-10-CM

## 2011-07-21 MED ORDER — MECLIZINE HCL 25 MG PO TABS: 25.0000 mg | ORAL_TABLET | ORAL | Status: AC | PRN

## 2011-07-21 NOTE — Progress Notes (Signed)
  Subjective:    Patient ID: Sydney Phillips, female    DOB: 1935/08/16, 76 y.o.   MRN: [id number]  HPI Here for one week of occasional dizzy spells which are triggered by rapidly moving her head up or down. No HA or nausea. Her bronchitis has improved quite a bit from her last visit here after taking Doxycycline. No SOB or chest pains.   Review of Systems  Constitutional: Negative.   Respiratory: Negative.   Cardiovascular: Negative.   Neurological: Positive for dizziness.       Objective:   Physical Exam  Constitutional: She is oriented to person, place, and time. She appears well-developed and well-nourished.  HENT:  Head: Normocephalic and atraumatic.  Right Ear: External ear normal.  Left Ear: External ear normal.  Nose: Nose normal.  Mouth/Throat: Oropharynx is clear and moist. No oropharyngeal exudate.  Eyes: Conjunctivae and EOM are normal. Pupils are equal, round, and reactive to light.  Neck: Neck supple. No thyromegaly present.  Cardiovascular: Normal rate, regular rhythm, normal heart sounds and intact distal pulses.   Pulmonary/Chest: Effort normal and breath sounds normal.  Lymphadenopathy:    She has no cervical adenopathy.  Neurological: She is alert and oriented to person, place, and time. She has normal reflexes. No cranial nerve deficit. She exhibits normal muscle tone. Coordination normal.          Assessment & Plan:  Start taking Claritin every day for 2 weeks. Use Meclizine prn

## 2011-07-27 ENCOUNTER — Other Ambulatory Visit: Payer: Self-pay

## 2011-07-27 ENCOUNTER — Emergency Department (HOSPITAL_COMMUNITY): Payer: Medicare Other

## 2011-07-27 ENCOUNTER — Inpatient Hospital Stay (HOSPITAL_COMMUNITY)
Admission: EM | Admit: 2011-07-27 | Discharge: 2011-08-01 | DRG: 253 | Disposition: A | Discharge status: Home / Self Care | Payer: Medicare Other | Attending: Internal Medicine | Admitting: Internal Medicine

## 2011-07-27 ENCOUNTER — Encounter (HOSPITAL_COMMUNITY): Payer: Self-pay | Admitting: *Deleted

## 2011-07-27 DIAGNOSIS — Z95810 Presence of automatic (implantable) cardiac defibrillator: Secondary | ICD-10-CM

## 2011-07-27 DIAGNOSIS — IMO0001 Reserved for inherently not codable concepts without codable children: Secondary | ICD-10-CM | POA: Diagnosis present

## 2011-07-27 DIAGNOSIS — J4489 Other specified chronic obstructive pulmonary disease: Secondary | ICD-10-CM | POA: Diagnosis present

## 2011-07-27 DIAGNOSIS — J449 Chronic obstructive pulmonary disease, unspecified: Secondary | ICD-10-CM | POA: Diagnosis present

## 2011-07-27 DIAGNOSIS — I428 Other cardiomyopathies: Secondary | ICD-10-CM | POA: Diagnosis present

## 2011-07-27 DIAGNOSIS — F17200 Nicotine dependence, unspecified, uncomplicated: Secondary | ICD-10-CM | POA: Diagnosis present

## 2011-07-27 DIAGNOSIS — K219 Gastro-esophageal reflux disease without esophagitis: Secondary | ICD-10-CM | POA: Diagnosis present

## 2011-07-27 DIAGNOSIS — I708 Atherosclerosis of other arteries: Principal | ICD-10-CM | POA: Diagnosis present

## 2011-07-27 DIAGNOSIS — I739 Peripheral vascular disease, unspecified: Secondary | ICD-10-CM | POA: Diagnosis present

## 2011-07-27 DIAGNOSIS — I658 Occlusion and stenosis of other precerebral arteries: Secondary | ICD-10-CM | POA: Diagnosis present

## 2011-07-27 DIAGNOSIS — G4700 Insomnia, unspecified: Secondary | ICD-10-CM | POA: Diagnosis present

## 2011-07-27 DIAGNOSIS — I6529 Occlusion and stenosis of unspecified carotid artery: Secondary | ICD-10-CM | POA: Diagnosis present

## 2011-07-27 DIAGNOSIS — I2510 Atherosclerotic heart disease of native coronary artery without angina pectoris: Secondary | ICD-10-CM | POA: Diagnosis present

## 2011-07-27 DIAGNOSIS — Z7982 Long term (current) use of aspirin: Secondary | ICD-10-CM

## 2011-07-27 DIAGNOSIS — Z860100 Personal history of colon polyps, unspecified: Secondary | ICD-10-CM

## 2011-07-27 DIAGNOSIS — Z95820 Peripheral vascular angioplasty status with implants and grafts: Secondary | ICD-10-CM

## 2011-07-27 DIAGNOSIS — R55 Syncope and collapse: Secondary | ICD-10-CM

## 2011-07-27 DIAGNOSIS — F329 Major depressive disorder, single episode, unspecified: Secondary | ICD-10-CM | POA: Diagnosis present

## 2011-07-27 DIAGNOSIS — I10 Essential (primary) hypertension: Secondary | ICD-10-CM | POA: Diagnosis present

## 2011-07-27 DIAGNOSIS — E785 Hyperlipidemia, unspecified: Secondary | ICD-10-CM | POA: Diagnosis present

## 2011-07-27 DIAGNOSIS — Z79899 Other long term (current) drug therapy: Secondary | ICD-10-CM

## 2011-07-27 DIAGNOSIS — F3289 Other specified depressive episodes: Secondary | ICD-10-CM | POA: Diagnosis present

## 2011-07-27 DIAGNOSIS — H8110 Benign paroxysmal vertigo, unspecified ear: Secondary | ICD-10-CM | POA: Diagnosis present

## 2011-07-27 DIAGNOSIS — Z7902 Long term (current) use of antithrombotics/antiplatelets: Secondary | ICD-10-CM

## 2011-07-27 DIAGNOSIS — R42 Dizziness and giddiness: Secondary | ICD-10-CM | POA: Diagnosis present

## 2011-07-27 DIAGNOSIS — E876 Hypokalemia: Secondary | ICD-10-CM | POA: Diagnosis present

## 2011-07-27 HISTORY — DX: Dizziness and giddiness: R42

## 2011-07-27 HISTORY — DX: Stricture of artery: I77.1

## 2011-07-27 HISTORY — DX: Peripheral vascular angioplasty status with implants and grafts: Z95.820

## 2011-07-27 LAB — POCT I-STAT TROPONIN I: Troponin i, poc: 0 ng/mL (ref 0.00–0.08)

## 2011-07-27 LAB — BASIC METABOLIC PANEL
BUN: 23 mg/dL (ref 6–23)
Creatinine, Ser: 1 mg/dL (ref 0.50–1.10)
GFR calc Af Amer: 63 mL/min — ABNORMAL LOW (ref >90)
GFR calc non Af Amer: 55 mL/min — ABNORMAL LOW (ref >90)
Potassium: 3 mEq/L — ABNORMAL LOW (ref 3.5–5.1)

## 2011-07-27 LAB — CBC
Hemoglobin: 13.9 g/dL (ref 12.0–15.0)
MCHC: 33.3 g/dL (ref 30.0–36.0)
Platelets: 305 10*3/uL (ref 150–400)
RDW: 15.1 % (ref 11.5–15.5)

## 2011-07-27 MED ORDER — ALPRAZOLAM 0.5 MG PO TABS
1.0000 mg | ORAL_TABLET | Freq: Four times a day (QID) | ORAL | Status: DC | PRN
  Administered 2011-07-28 – 2011-07-31 (×7): 1 mg via ORAL
  Filled 2011-07-27: qty 2
  Filled 2011-07-27 (×4): qty 1
  Filled 2011-07-27: qty 2
  Filled 2011-07-27: qty 1

## 2011-07-27 MED ORDER — SODIUM CHLORIDE 0.9 % IJ SOLN
3.0000 mL | Freq: Two times a day (BID) | INTRAMUSCULAR | Status: DC
  Administered 2011-07-28: 3 mL via INTRAVENOUS
  Administered 2011-07-30: 10 mL via INTRAVENOUS
  Administered 2011-07-30: 3 mL via INTRAVENOUS
  Administered 2011-07-30: 22:00:00 via INTRAVENOUS

## 2011-07-27 MED ORDER — DULOXETINE HCL 60 MG PO CPEP
60.0000 mg | ORAL_CAPSULE | Freq: Two times a day (BID) | ORAL | Status: DC
  Administered 2011-07-28 – 2011-08-01 (×10): 60 mg via ORAL
  Filled 2011-07-27 (×14): qty 1

## 2011-07-27 MED ORDER — MECLIZINE HCL 25 MG PO TABS
25.0000 mg | ORAL_TABLET | ORAL | Status: DC | PRN
  Filled 2011-07-27: qty 1

## 2011-07-27 MED ORDER — SODIUM CHLORIDE 0.9 % IJ SOLN: 3.0000 mL | INTRAMUSCULAR | Status: DC | PRN

## 2011-07-27 MED ORDER — MAGNESIUM SULFATE 40 MG/ML IJ SOLN
2.0000 g | INTRAMUSCULAR | Status: AC
  Administered 2011-07-27: 2 g via INTRAVENOUS
  Filled 2011-07-27: qty 50

## 2011-07-27 MED ORDER — POTASSIUM CHLORIDE 10 MEQ/100ML IV SOLN
10.0000 meq | Freq: Once | INTRAVENOUS | Status: AC
  Administered 2011-07-27: 10 meq via INTRAVENOUS
  Filled 2011-07-27: qty 100

## 2011-07-27 MED ORDER — SODIUM CHLORIDE 0.9 % IV SOLN
INTRAVENOUS | Status: AC
  Administered 2011-07-27: 20:00:00 via INTRAVENOUS

## 2011-07-27 MED ORDER — POTASSIUM CHLORIDE CRYS ER 20 MEQ PO TBCR
40.0000 meq | EXTENDED_RELEASE_TABLET | Freq: Once | ORAL | Status: AC
  Administered 2011-07-27: 40 meq via ORAL
  Filled 2011-07-27: qty 2

## 2011-07-27 MED ORDER — ASPIRIN EC 81 MG PO TBEC
81.0000 mg | DELAYED_RELEASE_TABLET | Freq: Every day | ORAL | Status: DC
  Administered 2011-07-28 – 2011-07-31 (×4): 81 mg via ORAL
  Filled 2011-07-27 (×5): qty 1

## 2011-07-27 MED ORDER — METOPROLOL TARTRATE 25 MG PO TABS
25.0000 mg | ORAL_TABLET | Freq: Two times a day (BID) | ORAL | Status: DC
  Administered 2011-07-28 – 2011-08-01 (×10): 25 mg via ORAL
  Filled 2011-07-27 (×12): qty 1

## 2011-07-27 MED ORDER — SODIUM CHLORIDE 0.9 % IJ SOLN
3.0000 mL | Freq: Two times a day (BID) | INTRAMUSCULAR | Status: DC
Start: 2011-07-27 — End: 2011-07-31
  Administered 2011-07-28 – 2011-07-30 (×4): 3 mL via INTRAVENOUS

## 2011-07-27 MED ORDER — SODIUM CHLORIDE 0.9 % IV SOLN: 250.0000 mL | INTRAVENOUS | Status: DC | PRN

## 2011-07-27 MED ORDER — CLOPIDOGREL BISULFATE 75 MG PO TABS
75.0000 mg | ORAL_TABLET | Freq: Every day | ORAL | Status: DC
  Administered 2011-07-28 – 2011-07-31 (×4): 75 mg via ORAL
  Filled 2011-07-27 (×5): qty 1

## 2011-07-27 NOTE — ED Notes (Signed)
Per EMS: patient recently diagnosed with vertigo. Pt c/o pain on her right side. Positive LOC. Pt c/o headache as well. No hematomas noted.

## 2011-07-27 NOTE — ED Notes (Addendum)
Pt arrived via ems on LSB and in [facility] [place]. Patient c/o headache, and right side body pain. Sts that she fell trying to turn on her ceiling fan. Patient sts that she doesn't remember falling but awoke with people around her.  Upon examination patient can turn her head to the left but gets very dizzy upon turning to the right. Patient removed from LSB by [place], [place].

## 2011-07-27 NOTE — ED Provider Notes (Signed)
History     CSN: [id number]  Arrival date & time 07/27/11  1632   First MD Initiated Contact with Patient 07/27/11 1643      Chief Complaint  Patient presents with  . Loss of Consciousness    Just diagnosed with vertigo    (Consider location/radiation/quality/duration/timing/severity/associated sxs/prior treatment) HPI History provided by pt.   Pt recently diagnosed w/ vertigo and is taking meclizine.  Became dizzy while reaching for ceiling fan chain this afternoon and fell to the floor, hitting her head.  She believes she lost consciousness but is unsure of at what point this happened.  She was able to press her life-alert button to call for help.  Pt is anti-coagulated w/ plavix.  She c/o her head feeling full but denies headache, blurred vision and N/V.  No dizziness currently.  Also c/o pain in right shoulder/ankle and resolved pain in right hip.  Denies recent CP, SOB, palpitations.  Denies recent fever, cough, vomiting, diarrhea and dysuria.  Dizzy spells started approx 2 weeks ago.  Occur when she raises one of her arms above her head and lasts for approx 30 seconds.  Describes as the room spinning.    Past Medical History  Diagnosis Date  . COPD (chronic obstructive pulmonary disease)   . Hypertension   . GERD (gastroesophageal reflux disease)   . Depression   . Hx of colonic polyps   . Insomnia   . Fibromyalgia   . Hyperlipemia   . CAD (coronary artery disease)   . Vertigo     Past Surgical History  Procedure Date  . Appendectomy   . Tonsillectomy   . Tubal ligation   . Cardiac defibrillator placement 05/2008    By Dr [name] HAVE MRI's    Family History  Problem Relation Age of Onset  . Colon cancer    . Hyperlipidemia      History  Substance Use Topics  . Smoking status: Current Everyday Smoker -- 0.5 packs/day    Types: Cigarettes  . Smokeless tobacco: Never Used   Comment: less than 1/2 pack a day  . Alcohol Use: No    OB History    Grav  Para Term Preterm Abortions TAB SAB Ect Mult Living                  Review of Systems  All other systems reviewed and are negative.    Allergies  Codeine; Darvon; Meloxicam; Propoxyphene n-acetaminophen; Rofecoxib; Azithromycin; Erythromycin; Penicillins; and Sulfa antibiotics  Home Medications   Current Outpatient Rx  Name Route Sig Dispense Refill  . ALPRAZOLAM 1 MG PO TABS Oral Take 1 tablet (1 mg total) by mouth 4 (four) times daily as needed for anxiety. 120 tablet 5  . ASPIRIN 81 MG PO TABS Oral Take 81 mg by mouth daily.      Marland Kitchen CLOPIDOGREL BISULFATE 75 MG PO TABS Oral Take 75 mg by mouth daily.      . DULOXETINE HCL 60 MG PO CPEP Oral Take 1 capsule (60 mg total) by mouth 2 (two) times daily. 60 capsule 11  . FUROSEMIDE 80 MG PO TABS  TAKE ONE TABLET BY MOUTH TWICE DAILY 60 tablet 9  . HYDROCODONE-ACETAMINOPHEN 10-660 MG PO TABS  TAKE ONE TABLET BY MOUTH EVERY 6 HOURS AS NEEDED FOR PAIN 120 each 5  . MECLIZINE HCL 25 MG PO TABS Oral Take 1 tablet (25 mg total) by mouth every 4 (four) hours as needed for dizziness. 60 tablet  5  . METOPROLOL TARTRATE 25 MG PO TABS Oral Take 25 mg by mouth 2 (two) times daily. Take 1/2 tablet twice a day    . NAPROXEN SODIUM 220 MG PO TABS Oral Take 220 mg by mouth 2 (two) times daily with a meal.      . POTASSIUM CHLORIDE CRYS ER 20 MEQ PO TBCR Oral Take 20 mEq by mouth 2 (two) times daily.      BP 171/82  Pulse 83  Temp(Src) 98.3 F (36.8 C) (Oral)  Resp 20  SpO2 92%  Physical Exam  Nursing note and vitals reviewed. Constitutional: She is oriented to person, place, and time. She appears well-developed and well-nourished. No distress.  HENT:  Head: Normocephalic and atraumatic.  Mouth/Throat: Oropharynx is clear and moist.  Eyes:       Mild injection bilateral conjunctiva  Neck: Normal range of motion.  Cardiovascular: Normal rate and regular rhythm.   Pulmonary/Chest: Effort normal and breath sounds normal. No respiratory  distress. She exhibits no tenderness.  Abdominal: Soft. Bowel sounds are normal. She exhibits no distension. There is no tenderness.  Musculoskeletal: Normal range of motion.       Entire spine non-tender.  Right shoulder w/out deformity.  Tenderness at proximal humerus.  Pain w/ passive flexion.  Nml elbow and wrist exam.  Distal NV intact.  Pelvis stable.  No right hip tenderness nor pain w/ passive ROM.  Nml knee exam.  Right ankle w/out deformity, edema, ecchymosis or ttp.  Full ROM w/out pain.  2+ DP pulse and distal sensation intact.   Neurological: She is alert and oriented to person, place, and time. She has normal reflexes. No cranial nerve deficit or sensory deficit. Coordination normal.       No nystagmus.  5/5 and equal upper and lower extremity strength.  No past pointing.   Pt became dizzy for approx 15 seconds upon rotating her had to the right.  Did not become dizzy with raising arms above her head.   Skin: Skin is warm and dry. No rash noted.  Psychiatric: She has a normal mood and affect. Her behavior is normal.    ED Course  Procedures (including critical care time)   Date: 07/27/2011  Rate: 77  Rhythm: Paced  QRS Axis: normal  Intervals: normal  ST/T Wave abnormalities: normal  Conduction Disutrbances:none  Narrative Interpretation:   Old EKG Reviewed: unchanged   Labs Reviewed  BASIC METABOLIC PANEL - Abnormal; Notable for the following:    Potassium 3.0 (*)    GFR calc non Af Amer 55 (*)    GFR calc Af Amer 63 (*)    All other components within normal limits  CBC - Abnormal; Notable for the following:    WBC 10.6 (*)    All other components within normal limits   Dg Shoulder Right  07/27/2011  *RADIOLOGY REPORT*  Clinical Data: Fall, right shoulder pain  RIGHT SHOULDER - 2+ VIEW  Comparison: None.  Findings: Three views of the right shoulder submitted.  No acute fracture or subluxation.  Mild spurring of the right humeral head.  Mild degenerative changes right  AC joint.  IMPRESSION: No acute fracture or subluxation.  Degenerative changes as described above.  Original Report Authenticated By: Natasha Mead, M.D.   Ct Head Wo Contrast  07/27/2011  *RADIOLOGY REPORT*  Clinical Data:  Fall.  Loss of consciousness.  CT HEAD WITHOUT CONTRAST CT CERVICAL SPINE WITHOUT CONTRAST  Technique:  Multidetector CT imaging of the  head and cervical spine was performed following the standard protocol without intravenous contrast.  Multiplanar CT image reconstructions of the cervical spine were also generated.  Comparison:  CT head 10/29/2009.  CT HEAD  Findings: Mild periventricular and subcortical white matter hypoattenuation bilaterally is stable.  No acute infarct, hemorrhage, or mass lesion is evident.  The ventricles are proportionate to the degree of atrophy.  No significant extra-axial fluid collection is present.  High-density fluid is present within the left maxillary sinus.  No definite fracture is evident.  Fluid posteriorly in the right maxillary sinus is not as dense.  The remaining paranasal sinuses and mastoid air cells are clear.  The visualized osseous skull is intact.  Atherosclerotic calcifications are present in the cavernous carotid arteries bilaterally.  IMPRESSION:  1.  No acute intracranial abnormality or significant interval change. 2.  Stable white matter disease. 3.  High-density fluid within the left maxillary sinus.  Although this could be related to infection or formation, occult fracture is not excluded.  If the patient has tenderness over the left face, CT of the face may be useful for further evaluation.  CT CERVICAL SPINE  Findings: The cervical spine is imaged from skull base through the midbody of T1.  Chronic degenerative changes and sclerotic marrow changes are evident at C1-2.  Multilevel facet degenerative changes are evident bilaterally.  Uncovertebral disease is present on the left at C4-5 on the right at C5-6.  Atherosclerotic calcifications are  present at the carotid bifurcations bilaterally.  The lung apices are clear.  IMPRESSION:  1.  Moderate spondylosis of the cervical spine, predominately at C4- 5 and C5-6. 2.  Moderate degenerative changes along the anterior arch of C1-2.  Original Report Authenticated By: Jamesetta Orleans. MATTERN, M.D.   Ct Cervical Spine Wo Contrast  07/27/2011  *RADIOLOGY REPORT*  Clinical Data:  Fall.  Loss of consciousness.  CT HEAD WITHOUT CONTRAST CT CERVICAL SPINE WITHOUT CONTRAST  Technique:  Multidetector CT imaging of the head and cervical spine was performed following the standard protocol without intravenous contrast.  Multiplanar CT image reconstructions of the cervical spine were also generated.  Comparison:  CT head 10/29/2009.  CT HEAD  Findings: Mild periventricular and subcortical white matter hypoattenuation bilaterally is stable.  No acute infarct, hemorrhage, or mass lesion is evident.  The ventricles are proportionate to the degree of atrophy.  No significant extra-axial fluid collection is present.  High-density fluid is present within the left maxillary sinus.  No definite fracture is evident.  Fluid posteriorly in the right maxillary sinus is not as dense.  The remaining paranasal sinuses and mastoid air cells are clear.  The visualized osseous skull is intact.  Atherosclerotic calcifications are present in the cavernous carotid arteries bilaterally.  IMPRESSION:  1.  No acute intracranial abnormality or significant interval change. 2.  Stable white matter disease. 3.  High-density fluid within the left maxillary sinus.  Although this could be related to infection or formation, occult fracture is not excluded.  If the patient has tenderness over the left face, CT of the face may be useful for further evaluation.  CT CERVICAL SPINE  Findings: The cervical spine is imaged from skull base through the midbody of T1.  Chronic degenerative changes and sclerotic marrow changes are evident at C1-2.  Multilevel  facet degenerative changes are evident bilaterally.  Uncovertebral disease is present on the left at C4-5 on the right at C5-6.  Atherosclerotic calcifications are present at the carotid bifurcations bilaterally.  The lung apices are clear.  IMPRESSION:  1.  Moderate spondylosis of the cervical spine, predominately at C4- 5 and C5-6. 2.  Moderate degenerative changes along the anterior arch of C1-2.  Original Report Authenticated By: Jamesetta Orleans. MATTERN, M.D.     1. Syncope       MDM  Pt presents w/ c/o syncope.  Preceded by room-spinning dizziness; recent diagnosis of BPPV by PCP.  It is unclear whether syncope happened prior to fall or after head hit ground.  Anti-coagulated w/ plavix.  Pt c/o head fullness as well as right shoulder pain.  No recent illnesses or CP/SOB/palpitations.  No signs of head trauma or focal neuro deficits on exam.  CT head and cervical spine as well as right shoulder xray neg.  Labs unremarkable w/ exception of troponin which is pending.  EKG shows a paced rhythm.  [facility] consulted for admission for syncope.        Otilio Miu, [place] 07/27/11 [phone]

## 2011-07-27 NOTE — ED Notes (Signed)
[id number]<AV> Expected date:<BR> Expected time: 4:38 PM<BR> Means of arrival:<BR> Comments:<BR> M80 - 75yoF Syncope then fall, pain to right side.  On thinners but no obv deformities

## 2011-07-27 NOTE — H&P (Signed)
PCP:   Nelwyn Salisbury, MD, MD   Chief Complaint:  Passed out  HPI: 76 year old female with a history of coronary artery disease  status post pacemaker and AICD placement who has been experiencing some sinusitis/bronchitis over the last month or so. Since she's been treated for his nasal congestion she's been experiencing benign positional vertigo over the last several weeks. This is been getting better with Antivert. This is been prescribed by her primary care physician. However today she went to go in turn off the ceiling fan raised her arm up at which point she passed out. She has no idea how long she was out for. She's been in her normal state of health denies any nausea, vomiting, diarrhea, fevers, chest pain, shortness of breath, abdominal pain. When she came to she did not have any urinary or bowel incontinence. She denies any previous history of syncopal episodes. She did not experience any vertigo surrounding the syncopal episode. She does get her pacemaker interrogated monthly and just had this done last week and which she reports no abnormalities.  Review of Systems:  Otherwise negative  Past Medical History: Past Medical History  Diagnosis Date  . COPD (chronic obstructive pulmonary disease)   . Hypertension   . GERD (gastroesophageal reflux disease)   . Depression   . Hx of colonic polyps   . Insomnia   . Fibromyalgia   . Hyperlipemia   . CAD (coronary artery disease)   . Vertigo    Past Surgical History  Procedure Date  . Appendectomy   . Tonsillectomy   . Tubal ligation   . Cardiac defibrillator placement 05/2008    By Dr [name] HAVE MRI's    Medications: Prior to Admission medications   Medication Sig Start Date End Date Taking? Authorizing Provider  ALPRAZolam Prudy Feeler) 1 MG tablet Take 1 tablet (1 mg total) by mouth 4 (four) times daily as needed for anxiety. 02/04/11 02/04/12 Yes Nelwyn Salisbury, MD  aspirin 81 MG tablet Take 81 mg by mouth daily.     Yes  Historical Provider, MD  clopidogrel (PLAVIX) 75 MG tablet Take 75 mg by mouth daily.     Yes Historical Provider, MD  DULoxetine (CYMBALTA) 60 MG capsule Take 1 capsule (60 mg total) by mouth 2 (two) times daily. 05/28/11  Yes Nelwyn Salisbury, MD  furosemide (LASIX) 80 MG tablet TAKE ONE TABLET BY MOUTH TWICE DAILY 01/09/11  Yes Nelwyn Salisbury, MD  Hydrocodone-Acetaminophen 10-660 MG TABS TAKE ONE TABLET BY MOUTH EVERY 6 HOURS AS NEEDED FOR PAIN 05/07/11  Yes Nelwyn Salisbury, MD  meclizine (ANTIVERT) 25 MG tablet Take 1 tablet (25 mg total) by mouth every 4 (four) hours as needed for dizziness. 07/21/11 07/31/11 Yes Nelwyn Salisbury, MD  metoprolol tartrate (LOPRESSOR) 25 MG tablet Take 25 mg by mouth 2 (two) times daily. Take 1/2 tablet twice a day 08/12/10  Yes Historical Provider, MD  naproxen sodium (ANAPROX) 220 MG tablet Take 220 mg by mouth 2 (two) times daily with a meal.     Yes Historical Provider, MD  potassium chloride SA (K-DUR,KLOR-CON) 20 MEQ tablet Take 20 mEq by mouth 2 (two) times daily. 12/06/10  Yes Nelwyn Salisbury, MD    Allergies:   Allergies  Allergen Reactions  . Codeine   . Darvon   . Meloxicam   . Propoxyphene N-Acetaminophen   . Rofecoxib   . Azithromycin Rash  . Erythromycin Rash  . Penicillins Rash  . Sulfa  Antibiotics Rash    Social History:  reports that she has been smoking Cigarettes.  She has been smoking about .5 packs per day. She has never used smokeless tobacco. She reports that she does not drink alcohol or use illicit drugs.  Family History: Family History  Problem Relation Age of Onset  . Colon cancer    . Hyperlipidemia      Physical Exam: Filed Vitals:   07/27/11 2027 07/27/11 2028 07/27/11 2030 07/27/11 2034  BP: 142/57 146/58 149/66 137/73  Pulse: 66 67 75 73  Temp: 97.9 F (36.6 C)     TempSrc: Oral     Resp: 18     SpO2: 95%      BP 151/60  Pulse 69  Temp(Src) 97.9 F (36.6 C) (Oral)  Resp 20  SpO2 91% General appearance: alert,  cooperative and no distress Lungs: clear to auscultation bilaterally Heart: regular rate and rhythm, S1, S2 normal, no murmur, click, rub or gallop Abdomen: soft, non-tender; bowel sounds normal; no masses,  no organomegaly Extremities: extremities normal, atraumatic, no cyanosis or edema Pulses: 2+ and symmetric Skin: Skin color, texture, turgor normal. No rashes or lesions Neurologic: Grossly normal    Labs on Admission:   [facility] 07/27/11 1725  NA 141  K 3.0*  CL 101  CO2 30  GLUCOSE 91  BUN 23  CREATININE 1.00  CALCIUM 9.4  MG --  PHOS --    Basename 07/27/11 1725  WBC 10.6*  NEUTROABS --  HGB 13.9  HCT 41.7  MCV 84.9  PLT 305    Radiological Exams on Admission: Dg Shoulder Right  07/27/2011  *RADIOLOGY REPORT*  Clinical Data: Fall, right shoulder pain  RIGHT SHOULDER - 2+ VIEW  Comparison: None.  Findings: Three views of the right shoulder submitted.  No acute fracture or subluxation.  Mild spurring of the right humeral head.  Mild degenerative changes right AC joint.  IMPRESSION: No acute fracture or subluxation.  Degenerative changes as described above.  Original Report Authenticated By: Natasha Mead, M.D.   Ct Head Wo Contrast  07/27/2011  *RADIOLOGY REPORT*  Clinical Data:  Fall.  Loss of consciousness.  CT HEAD WITHOUT CONTRAST CT CERVICAL SPINE WITHOUT CONTRAST  Technique:  Multidetector CT imaging of the head and cervical spine was performed following the standard protocol without intravenous contrast.  Multiplanar CT image reconstructions of the cervical spine were also generated.  Comparison:  CT head 10/29/2009.  CT HEAD  Findings: Mild periventricular and subcortical white matter hypoattenuation bilaterally is stable.  No acute infarct, hemorrhage, or mass lesion is evident.  The ventricles are proportionate to the degree of atrophy.  No significant extra-axial fluid collection is present.  High-density fluid is present within the left maxillary sinus.  No  definite fracture is evident.  Fluid posteriorly in the right maxillary sinus is not as dense.  The remaining paranasal sinuses and mastoid air cells are clear.  The visualized osseous skull is intact.  Atherosclerotic calcifications are present in the cavernous carotid arteries bilaterally.  IMPRESSION:  1.  No acute intracranial abnormality or significant interval change. 2.  Stable white matter disease. 3.  High-density fluid within the left maxillary sinus.  Although this could be related to infection or formation, occult fracture is not excluded.  If the patient has tenderness over the left face, CT of the face may be useful for further evaluation.  CT CERVICAL SPINE  Findings: The cervical spine is imaged from skull base through the  midbody of T1.  Chronic degenerative changes and sclerotic marrow changes are evident at C1-2.  Multilevel facet degenerative changes are evident bilaterally.  Uncovertebral disease is present on the left at C4-5 on the right at C5-6.  Atherosclerotic calcifications are present at the carotid bifurcations bilaterally.  The lung apices are clear.  IMPRESSION:  1.  Moderate spondylosis of the cervical spine, predominately at C4- 5 and C5-6. 2.  Moderate degenerative changes along the anterior arch of C1-2.  Original Report Authenticated By: Jamesetta Orleans. MATTERN, M.D.   Ct Cervical Spine Wo Contrast  07/27/2011  *RADIOLOGY REPORT*  Clinical Data:  Fall.  Loss of consciousness.  CT HEAD WITHOUT CONTRAST CT CERVICAL SPINE WITHOUT CONTRAST  Technique:  Multidetector CT imaging of the head and cervical spine was performed following the standard protocol without intravenous contrast.  Multiplanar CT image reconstructions of the cervical spine were also generated.  Comparison:  CT head 10/29/2009.  CT HEAD  Findings: Mild periventricular and subcortical white matter hypoattenuation bilaterally is stable.  No acute infarct, hemorrhage, or mass lesion is evident.  The ventricles are  proportionate to the degree of atrophy.  No significant extra-axial fluid collection is present.  High-density fluid is present within the left maxillary sinus.  No definite fracture is evident.  Fluid posteriorly in the right maxillary sinus is not as dense.  The remaining paranasal sinuses and mastoid air cells are clear.  The visualized osseous skull is intact.  Atherosclerotic calcifications are present in the cavernous carotid arteries bilaterally.  IMPRESSION:  1.  No acute intracranial abnormality or significant interval change. 2.  Stable white matter disease. 3.  High-density fluid within the left maxillary sinus.  Although this could be related to infection or formation, occult fracture is not excluded.  If the patient has tenderness over the left face, CT of the face may be useful for further evaluation.  CT CERVICAL SPINE  Findings: The cervical spine is imaged from skull base through the midbody of T1.  Chronic degenerative changes and sclerotic marrow changes are evident at C1-2.  Multilevel facet degenerative changes are evident bilaterally.  Uncovertebral disease is present on the left at C4-5 on the right at C5-6.  Atherosclerotic calcifications are present at the carotid bifurcations bilaterally.  The lung apices are clear.  IMPRESSION:  1.  Moderate spondylosis of the cervical spine, predominately at C4- 5 and C5-6. 2.  Moderate degenerative changes along the anterior arch of C1-2.  Original Report Authenticated By: Jamesetta Orleans. MATTERN, M.D.    Assessment/Plan Present on Admission:  76 year old female with syncopal episode  #1 syncope patient really has no focal neurological symptoms she cannot get MRI due to her pacemaker. We will order a CTA of her head and neck instead proceed with 2-D echo. We'll also order for pacemaker interrogation in the morning and to have her AICD checked. We'll serial cardiac enzymes and place on telemetry. Her potassium is already been repleted by the ED we'll  repeat this in the morning. Continue her Antivert for her vertiginous symptoms which have been thought to be due to recent upper respiratory tract infection. It sounds like her syncopal episode actually occurred when she was not having any vertigo. Sounds like her vertigo is also been getting much better with treatment of her nasal congestion. Will do further neurological evaluation to make sure that this is not indeed a CVA. However she is also high risk for arrhythmias due to her above cardiac history. This does not  sound like a seizure.? Subclavian steal syndrome also. Further recommendations depending on the results of the above workup.  #2 coronary artery disease status post pacer/AICD placement serial cardiac enzymes  #3 again recently diagnosed benign positional vertigo continue Antivert  #4 history of fibromyalgia this seems to be stable  Naethan Bracewell A [phone] 07/27/2011, 9:08 PM

## 2011-07-28 ENCOUNTER — Encounter (HOSPITAL_COMMUNITY): Payer: Self-pay

## 2011-07-28 DIAGNOSIS — Z95810 Presence of automatic (implantable) cardiac defibrillator: Secondary | ICD-10-CM | POA: Diagnosis present

## 2011-07-28 DIAGNOSIS — I739 Peripheral vascular disease, unspecified: Secondary | ICD-10-CM | POA: Diagnosis present

## 2011-07-28 LAB — CBC
HCT: 40.5 % (ref 36.0–46.0)
MCH: 27.6 pg (ref 26.0–34.0)
MCHC: 32.3 g/dL (ref 30.0–36.0)
RDW: 15.2 % (ref 11.5–15.5)

## 2011-07-28 LAB — BASIC METABOLIC PANEL
BUN: 19 mg/dL (ref 6–23)
Chloride: 101 mEq/L (ref 96–112)
Creatinine, Ser: 1.03 mg/dL (ref 0.50–1.10)
GFR calc Af Amer: 61 mL/min — ABNORMAL LOW (ref >90)
Glucose, Bld: 98 mg/dL (ref 70–99)

## 2011-07-28 LAB — CARDIAC PANEL(CRET KIN+CKTOT+MB+TROPI)
CK, MB: 4.5 ng/mL — ABNORMAL HIGH (ref 0.3–4.0)
CK, MB: 4.4 ng/mL — ABNORMAL HIGH (ref 0.3–4.0)
Relative Index: 4.1 — ABNORMAL HIGH (ref 0.0–2.5)
Troponin I: <0.3 ng/mL (ref <0.30)
Troponin I: <0.3 ng/mL (ref <0.30)

## 2011-07-28 MED ORDER — IOHEXOL 350 MG/ML SOLN
100.0000 mL | Freq: Once | INTRAVENOUS | Status: AC | PRN
  Administered 2011-07-28: 100 mL via INTRAVENOUS

## 2011-07-28 NOTE — Progress Notes (Signed)
Subjective: No current complaints. Has been in the ED for 22 hours.  Objective: Vital signs in last 24 hours: Temp:  [97.7 F (36.5 C)-98.3 F (36.8 C)] 97.7 F (36.5 C) (02/11 0631) Pulse Rate:  [66-83] 68  (02/11 1223) Resp:  [15-21] 21  (02/11 1223) BP: (135-171)/(46-82) 161/63 mmHg (02/11 1223) SpO2:  [91 %-99 %] 95 % (02/11 1223) Weight change:     Intake/Output from previous day:       Physical Exam: General: Alert, awake, oriented x3, in no acute distress. HEENT: No bruits, no goiter. Heart: Regular rate and rhythm, without murmurs, rubs, gallops. Lungs: Clear to auscultation bilaterally. Abdomen: Soft, nontender, nondistended, positive bowel sounds. Extremities: No clubbing cyanosis or edema with positive pedal pulses.    Lab Results: Basic Metabolic Panel:  Basename 07/28/11 0620 07/27/11 1725  NA 137 141  K 3.7 3.0*  CL 101 101  CO2 28 30  GLUCOSE 98 91  BUN 19 23  CREATININE 1.03 1.00  CALCIUM 9.3 9.4  MG -- --  PHOS -- --   CBC:  Basename 07/28/11 0620 07/27/11 1725  WBC 10.1 10.6*  NEUTROABS -- --  HGB 13.1 13.9  HCT 40.5 41.7  MCV 85.4 84.9  PLT 286 305   Cardiac Enzymes:  Basename 07/28/11 0620 07/27/11 2330  CKTOTAL 119 107  CKMB 4.5* 4.4*  CKMBINDEX -- --  TROPONINI <0.30 <0.30    Studies/Results: Ct Angio Head W/cm &/or Wo Cm  07/28/2011  *RADIOLOGY REPORT*  Clinical Data:  SYNCOPAL EPISODE WHILE WALKING UP TO TURN ON A LIGHT.  CT ANGIOGRAPHY HEAD AND NECK  Technique:  Multidetector CT imaging of the head and neck was performed using the standard protocol during bolus administration of intravenous contrast.  Multiplanar CT image reconstructions including MIPs were obtained to evaluate the vascular anatomy. Carotid stenosis measurements (when applicable) are obtained utilizing NASCET criteria, using the distal internal carotid diameter as the denominator.  Contrast: OMNIPAQUE IOHEXOL 350 MG/ML IV SOLN  Comparison:  CT head  without contrast 07/27/2011.  CTA NECK  Findings:  A standard three-vessel arch configuration is present.  Calcifications are present at the origin of the innominate and left common carotid artery.  There are dense calcifications at the origin of the left subclavian artery with near total loss of contrast volume.  A tandem stenosis of the left subclavian artery measures 70% relative to the distal vessel.  The left vertebral artery originates at the distal aspect of this stenosis next to a calcification.  There is a high-grade stenosis of the proximal left vertebral artery.  The right vertebral artery origin is unremarkable.  The right vertebral artery is the dominant vessel.  The left vertebral artery enters the spinal canal at C6.  The right vertebral artery enters at C4. There is irregularity of the left vertebral artery at the level of the arch of C1.  The vessel and bifurcates into the PICA and the left vertebral artery.  The right common carotid artery demonstrates minimal calcification without significant stenosis.  There is near complete loss of contrast within the proximal right internal carotid artery, compatible with a high-grade stenosis.  The remainder of the right internal carotid artery is slightly smaller than the left.  The left common carotid artery exhibits scattered calcifications along its course.  There are no significant stenoses.  At the bifurcation, the vessel is narrowed to 2.0 mm.  Compared with the distal vessel of 4.2 mm, there is no significant stenosis.  Review of the MIP images confirms the above findings.  IMPRESSION:  1.  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  2.  Vessel irregularity in the distal left vertebral artery at the bifurcation to the PICA.  No focal injury is evident. 3.  High-grade stenosis of the proximal right internal carotid artery relative to the distal vessel. 4.  Approximately 50% stenosis of the left internal carotid artery  at the bifurcation.  CTA HEAD  Findings:  The noncontrast CT head demonstrates scattered white matter hypoattenuation as before.  Atherosclerotic calcifications are present within the cavernous carotid arteries bilaterally without significant stenosis relative to the distal vessels.  The A1 and M1 segments are normal.  The anterior communicating artery is patent.  The MCA bifurcations are within normal limits.  There is segmental irregularity within the MCA branches bilaterally without a significant proximal stenosis or occlusion.  The basilar artery is within normal limits.  Both posterior cerebral arteries originate from basilar tip.  There is irregularity within the PCA branches bilaterally without significant proximal stenosis or occlusion.  The dural sinuses fill normally.  The postcontrast images demonstrate no areas of pathologic enhancement.   Review of the MIP images confirms the above findings.  IMPRESSION:  1.  Atherosclerotic disease within the cavernous carotid arteries bilaterally without significant stenosis relative to the distal vessels. 2.  Moderate small vessel disease. 3.  No significant proximal stenosis, aneurysm, or branch vessel occlusion. 4.  Extensive white matter disease, likely related to a small vessel disease.  Original Report Authenticated By: Jamesetta Orleans. MATTERN, M.D.   Dg Shoulder Right  07/27/2011  *RADIOLOGY REPORT*  Clinical Data: Fall, right shoulder pain  RIGHT SHOULDER - 2+ VIEW  Comparison: None.  Findings: Three views of the right shoulder submitted.  No acute fracture or subluxation.  Mild spurring of the right humeral head.  Mild degenerative changes right AC joint.  IMPRESSION: No acute fracture or subluxation.  Degenerative changes as described above.  Original Report Authenticated By: Natasha Mead, M.D.   Ct Head Wo Contrast  07/27/2011  *RADIOLOGY REPORT*  Clinical Data:  Fall.  Loss of consciousness.  CT HEAD WITHOUT CONTRAST CT CERVICAL SPINE WITHOUT CONTRAST   Technique:  Multidetector CT imaging of the head and cervical spine was performed following the standard protocol without intravenous contrast.  Multiplanar CT image reconstructions of the cervical spine were also generated.  Comparison:  CT head 10/29/2009.  CT HEAD  Findings: Mild periventricular and subcortical white matter hypoattenuation bilaterally is stable.  No acute infarct, hemorrhage, or mass lesion is evident.  The ventricles are proportionate to the degree of atrophy.  No significant extra-axial fluid collection is present.  High-density fluid is present within the left maxillary sinus.  No definite fracture is evident.  Fluid posteriorly in the right maxillary sinus is not as dense.  The remaining paranasal sinuses and mastoid air cells are clear.  The visualized osseous skull is intact.  Atherosclerotic calcifications are present in the cavernous carotid arteries bilaterally.  IMPRESSION:  1.  No acute intracranial abnormality or significant interval change. 2.  Stable white matter disease. 3.  High-density fluid within the left maxillary sinus.  Although this could be related to infection or formation, occult fracture is not excluded.  If the patient has tenderness over the left face, CT of the face may be useful for further evaluation.  CT CERVICAL SPINE  Findings: The cervical spine is imaged from skull base through the  midbody of T1.  Chronic degenerative changes and sclerotic marrow changes are evident at C1-2.  Multilevel facet degenerative changes are evident bilaterally.  Uncovertebral disease is present on the left at C4-5 on the right at C5-6.  Atherosclerotic calcifications are present at the carotid bifurcations bilaterally.  The lung apices are clear.  IMPRESSION:  1.  Moderate spondylosis of the cervical spine, predominately at C4- 5 and C5-6. 2.  Moderate degenerative changes along the anterior arch of C1-2.  Original Report Authenticated By: Jamesetta Orleans. MATTERN, M.D.   Ct Angio  Neck W/cm &/or Wo/cm  07/28/2011  *RADIOLOGY REPORT*  Clinical Data:  SYNCOPAL EPISODE WHILE WALKING UP TO TURN ON A LIGHT.  CT ANGIOGRAPHY HEAD AND NECK  Technique:  Multidetector CT imaging of the head and neck was performed using the standard protocol during bolus administration of intravenous contrast.  Multiplanar CT image reconstructions including MIPs were obtained to evaluate the vascular anatomy. Carotid stenosis measurements (when applicable) are obtained utilizing NASCET criteria, using the distal internal carotid diameter as the denominator.  Contrast: OMNIPAQUE IOHEXOL 350 MG/ML IV SOLN  Comparison:  CT head without contrast 07/27/2011.  CTA NECK  Findings:  A standard three-vessel arch configuration is present.  Calcifications are present at the origin of the innominate and left common carotid artery.  There are dense calcifications at the origin of the left subclavian artery with near total loss of contrast volume.  A tandem stenosis of the left subclavian artery measures 70% relative to the distal vessel.  The left vertebral artery originates at the distal aspect of this stenosis next to a calcification.  There is a high-grade stenosis of the proximal left vertebral artery.  The right vertebral artery origin is unremarkable.  The right vertebral artery is the dominant vessel.  The left vertebral artery enters the spinal canal at C6.  The right vertebral artery enters at C4. There is irregularity of the left vertebral artery at the level of the arch of C1.  The vessel and bifurcates into the PICA and the left vertebral artery.  The right common carotid artery demonstrates minimal calcification without significant stenosis.  There is near complete loss of contrast within the proximal right internal carotid artery, compatible with a high-grade stenosis.  The remainder of the right internal carotid artery is slightly smaller than the left.  The left common carotid artery exhibits scattered  calcifications along its course.  There are no significant stenoses.  At the bifurcation, the vessel is narrowed to 2.0 mm.  Compared with the distal vessel of 4.2 mm, there is no significant stenosis.   Review of the MIP images confirms the above findings.  IMPRESSION:  1.  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  2.  Vessel irregularity in the distal left vertebral artery at the bifurcation to the PICA.  No focal injury is evident. 3.  High-grade stenosis of the proximal right internal carotid artery relative to the distal vessel. 4.  Approximately 50% stenosis of the left internal carotid artery at the bifurcation.  CTA HEAD  Findings:  The noncontrast CT head demonstrates scattered white matter hypoattenuation as before.  Atherosclerotic calcifications are present within the cavernous carotid arteries bilaterally without significant stenosis relative to the distal vessels.  The A1 and M1 segments are normal.  The anterior communicating artery is patent.  The MCA bifurcations are within normal limits.  There is segmental irregularity within the MCA branches bilaterally without a significant proximal  stenosis or occlusion.  The basilar artery is within normal limits.  Both posterior cerebral arteries originate from basilar tip.  There is irregularity within the PCA branches bilaterally without significant proximal stenosis or occlusion.  The dural sinuses fill normally.  The postcontrast images demonstrate no areas of pathologic enhancement.   Review of the MIP images confirms the above findings.  IMPRESSION:  1.  Atherosclerotic disease within the cavernous carotid arteries bilaterally without significant stenosis relative to the distal vessels. 2.  Moderate small vessel disease. 3.  No significant proximal stenosis, aneurysm, or branch vessel occlusion. 4.  Extensive white matter disease, likely related to a small vessel disease.  Original Report Authenticated By: Jamesetta Orleans. MATTERN, M.D.   Ct Cervical Spine Wo Contrast  07/27/2011  *RADIOLOGY REPORT*  Clinical Data:  Fall.  Loss of consciousness.  CT HEAD WITHOUT CONTRAST CT CERVICAL SPINE WITHOUT CONTRAST  Technique:  Multidetector CT imaging of the head and cervical spine was performed following the standard protocol without intravenous contrast.  Multiplanar CT image reconstructions of the cervical spine were also generated.  Comparison:  CT head 10/29/2009.  CT HEAD  Findings: Mild periventricular and subcortical white matter hypoattenuation bilaterally is stable.  No acute infarct, hemorrhage, or mass lesion is evident.  The ventricles are proportionate to the degree of atrophy.  No significant extra-axial fluid collection is present.  High-density fluid is present within the left maxillary sinus.  No definite fracture is evident.  Fluid posteriorly in the right maxillary sinus is not as dense.  The remaining paranasal sinuses and mastoid air cells are clear.  The visualized osseous skull is intact.  Atherosclerotic calcifications are present in the cavernous carotid arteries bilaterally.  IMPRESSION:  1.  No acute intracranial abnormality or significant interval change. 2.  Stable white matter disease. 3.  High-density fluid within the left maxillary sinus.  Although this could be related to infection or formation, occult fracture is not excluded.  If the patient has tenderness over the left face, CT of the face may be useful for further evaluation.  CT CERVICAL SPINE  Findings: The cervical spine is imaged from skull base through the midbody of T1.  Chronic degenerative changes and sclerotic marrow changes are evident at C1-2.  Multilevel facet degenerative changes are evident bilaterally.  Uncovertebral disease is present on the left at C4-5 on the right at C5-6.  Atherosclerotic calcifications are present at the carotid bifurcations bilaterally.  The lung apices are clear.  IMPRESSION:  1.  Moderate spondylosis of the  cervical spine, predominately at C4- 5 and C5-6. 2.  Moderate degenerative changes along the anterior arch of C1-2.  Original Report Authenticated By: Jamesetta Orleans. MATTERN, M.D.    Medications: Scheduled Meds:   . sodium chloride   Intravenous STAT  . aspirin EC  81 mg Oral Daily  . clopidogrel  75 mg Oral Daily  . DULoxetine  60 mg Oral BID  . magnesium sulfate 1 - 4 g bolus IVPB  2 g Intravenous STAT  . metoprolol tartrate  25 mg Oral BID  . potassium chloride  10 mEq Intravenous Once  . potassium chloride  40 mEq Oral Once  . sodium chloride  3 mL Intravenous Q12H  . sodium chloride  3 mL Intravenous Q12H   Continuous Infusions:  PRN Meds:.sodium chloride, ALPRAZolam, iohexol, meclizine, sodium chloride  Assessment/Plan:  Principal Problem:  *Syncope Active Problems:  CORONARY ARTERY DISEASE  Vertigo  Hypokalemia   #1 Syncope: CTA with  near complete occlusion of the right ICA. Has been following with Dr. Allyson Sabal for these issues. For the past 10 days has been treated for "vertigo" by her PCP that only occurs when she raises her right arm above her head (this is also how her syncopal episode occurred). I believe that most likely she is now becoming symptomatic from this lesion and may require intervention. ECHO pending. Will also ask cards to interrogate her pacemaker. Consult called.   LOS: 1 day   [facility] Pager: [phone] 07/28/2011, 3:18 PM

## 2011-07-28 NOTE — ED Notes (Signed)
Pt remains in CT

## 2011-07-28 NOTE — ED Notes (Signed)
Tech at bedside to perform Echocardiogram.

## 2011-07-28 NOTE — ED Notes (Signed)
Patient transported to CT 

## 2011-07-28 NOTE — ED Notes (Signed)
Admitting MD at bedside to re-evaluate pt.

## 2011-07-28 NOTE — Consults (Signed)
Reason for Consult: Syncope  Requesting Physician: Dr Ardyth Harps  HPI: This is a 76 y.o. female with a past medical history significant for PVD and cardiomyopathy. Pt has been followed by Dr Allyson Sabal. She has known Lt SCA disease previously felt to be asymptomatic.  She has had moderate carotid disease and documented LE PVD. She had RCIA PTA 2/12. She has had a cardiomyopathy that is felt to be "mixed". EF was 20% in 2009. At that time she had 75% LAD and 75% RCA. A MDT Biv ICD was placed. 2D echo 2/12 showed her EF to be 55%. Myoview 2/12 was low risk. She has not had CHF or angina. There is no history of arrythmia. She has had a recent bout of bronchitis with associated "vertigo". She says today she raised her Rt arm to turn on a ceiling fan and was found by a neighbor on the floor. There was no apparent injury. We are asked to see for further evaluation.  PMHx:  Past Medical History  Diagnosis Date  . COPD (chronic obstructive pulmonary disease)   . Hypertension   . GERD (gastroesophageal reflux disease)   . Depression   . Hx of colonic polyps   . Insomnia   . Fibromyalgia   . Hyperlipemia   . CAD (coronary artery disease)   . Vertigo    Past Surgical History  Procedure Date  . Appendectomy   . Tonsillectomy   . Tubal ligation   . Cardiac defibrillator placement 05/2008    By Dr [name] HAVE MRI's    FAMHx: Family History  Problem Relation Age of Onset  . Colon cancer    . Hyperlipidemia      SOCHx:  reports that she has been smoking Cigarettes.  She has been smoking about .5 packs per day. She has never used smokeless tobacco. She reports that she does not drink alcohol or use illicit drugs.  ALLERGIES: Allergies  Allergen Reactions  . Codeine   . Darvon   . Meloxicam   . Propoxyphene N-Acetaminophen   . Rofecoxib   . Azithromycin Rash  . Erythromycin Rash  . Penicillins Rash  . Sulfa Antibiotics Rash    ROS: Pertinent items are noted in HPI. No recent  angina, palpitations, or SOB.  HOME MEDICATIONS: Prescriptions prior to admission  Medication Sig Dispense Refill  . ALPRAZolam (XANAX) 1 MG tablet Take 1 tablet (1 mg total) by mouth 4 (four) times daily as needed for anxiety.  120 tablet  5  . aspirin 81 MG tablet Take 81 mg by mouth daily.        . clopidogrel (PLAVIX) 75 MG tablet Take 75 mg by mouth daily.        . DULoxetine (CYMBALTA) 60 MG capsule Take 1 capsule (60 mg total) by mouth 2 (two) times daily.  60 capsule  11  . furosemide (LASIX) 80 MG tablet TAKE ONE TABLET BY MOUTH TWICE DAILY  60 tablet  9  . Hydrocodone-Acetaminophen 10-660 MG TABS TAKE ONE TABLET BY MOUTH EVERY 6 HOURS AS NEEDED FOR PAIN  120 each  5  . meclizine (ANTIVERT) 25 MG tablet Take 1 tablet (25 mg total) by mouth every 4 (four) hours as needed for dizziness.  60 tablet  5  . metoprolol tartrate (LOPRESSOR) 25 MG tablet Take 25 mg by mouth 2 (two) times daily. Take 1/2 tablet twice a day      . naproxen sodium (ANAPROX) 220 MG tablet Take 220 mg by mouth  2 (two) times daily with a meal.        . potassium chloride SA (K-DUR,KLOR-CON) 20 MEQ tablet Take 20 mEq by mouth 2 (two) times daily.        HOSPITAL MEDICATIONS: I have reviewed the patient's current medications.  VITALS: Blood pressure 141/60, pulse 68, temperature 97.7 F (36.5 C), temperature source Oral, resp. rate 20, SpO2 94.00%.  PHYSICAL EXAM: General appearance: alert, cooperative and no distress Neck: no JVD, supple, symmetrical, trachea midline, thyroid not enlarged, symmetric, no tenderness/mass/nodules and Rt carotid bruit Lungs: clear to auscultation bilaterally Heart: regular rate and rhythm Abdomen: soft, non-tender; bowel sounds normal; no masses,  no organomegaly Extremities: warm, faint distal pulses Pulses: 3/4 Rt radial pulse, diminished Lt radial pulse. B/P Lt arm 11/50, Rt arm 152/62 Skin: Skin color, texture, turgor normal. No rashes or lesions Neurologic: Grossly  normal  LABS: Results for orders placed during the hospital encounter of 07/27/11 (from the past 48 hour(s))  BASIC METABOLIC PANEL     Status: Abnormal   Collection Time   07/27/11  5:25 PM      Component Value Range Comment   Sodium 141  135 - 145 (mEq/L)    Potassium 3.0 (*) 3.5 - 5.1 (mEq/L)    Chloride 101  96 - 112 (mEq/L)    CO2 30  19 - 32 (mEq/L)    Glucose, Bld 91  70 - 99 (mg/dL)    BUN 23  6 - 23 (mg/dL)    Creatinine, Ser 1.61  0.50 - 1.10 (mg/dL)    Calcium 9.4  8.4 - 10.5 (mg/dL)    GFR calc non Af Amer 55 (*) >90 (mL/min)    GFR calc Af Amer 63 (*) >90 (mL/min)   CBC     Status: Abnormal   Collection Time   07/27/11  5:25 PM      Component Value Range Comment   WBC 10.6 (*) 4.0 - 10.5 (K/uL)    RBC 4.91  3.87 - 5.11 (MIL/uL)    Hemoglobin 13.9  12.0 - 15.0 (g/dL)    HCT 09.6  04.5 - 40.9 (%)    MCV 84.9  78.0 - 100.0 (fL)    MCH 28.3  26.0 - 34.0 (pg)    MCHC 33.3  30.0 - 36.0 (g/dL)    RDW 81.1  91.4 - 78.2 (%)    Platelets 305  150 - 400 (K/uL)   POCT I-STAT TROPONIN I     Status: Normal   Collection Time   07/27/11  7:20 PM      Component Value Range Comment   Troponin i, poc 0.00  0.00 - 0.08 (ng/mL)    Comment 3            CARDIAC PANEL(CRET KIN+CKTOT+MB+TROPI)     Status: Abnormal   Collection Time   07/27/11 11:30 PM      Component Value Range Comment   Total CK 107  7 - 177 (U/L)    CK, MB 4.4 (*) 0.3 - 4.0 (ng/mL)    Troponin I <0.30  <0.30 (ng/mL)    Relative Index 4.1 (*) 0.0 - 2.5    CARDIAC PANEL(CRET KIN+CKTOT+MB+TROPI)     Status: Abnormal   Collection Time   07/28/11  6:20 AM      Component Value Range Comment   Total CK 119  7 - 177 (U/L)    CK, MB 4.5 (*) 0.3 - 4.0 (ng/mL)    Troponin I <0.30  <  0.30 (ng/mL)    Relative Index 3.8 (*) 0.0 - 2.5    BASIC METABOLIC PANEL     Status: Abnormal   Collection Time   07/28/11  6:20 AM      Component Value Range Comment   Sodium 137  135 - 145 (mEq/L)    Potassium 3.7  3.5 - 5.1 (mEq/L)     Chloride 101  96 - 112 (mEq/L)    CO2 28  19 - 32 (mEq/L)    Glucose, Bld 98  70 - 99 (mg/dL)    BUN 19  6 - 23 (mg/dL)    Creatinine, Ser 1.61  0.50 - 1.10 (mg/dL)    Calcium 9.3  8.4 - 10.5 (mg/dL)    GFR calc non Af Amer 53 (*) >90 (mL/min)    GFR calc Af Amer 61 (*) >90 (mL/min)   CBC     Status: Normal   Collection Time   07/28/11  6:20 AM      Component Value Range Comment   WBC 10.1  4.0 - 10.5 (K/uL)    RBC 4.74  3.87 - 5.11 (MIL/uL)    Hemoglobin 13.1  12.0 - 15.0 (g/dL)    HCT 09.6  04.5 - 40.9 (%)    MCV 85.4  78.0 - 100.0 (fL)    MCH 27.6  26.0 - 34.0 (pg)    MCHC 32.3  30.0 - 36.0 (g/dL)    RDW 81.1  91.4 - 78.2 (%)    Platelets 286  150 - 400 (K/uL)   CARDIAC PANEL(CRET KIN+CKTOT+MB+TROPI)     Status: Abnormal   Collection Time   07/28/11  3:12 PM      Component Value Range Comment   Total CK 107  7 - 177 (U/L)    CK, MB 4.4 (*) 0.3 - 4.0 (ng/mL)    Troponin I <0.30  <0.30 (ng/mL)    Relative Index 4.1 (*) 0.0 - 2.5      IMAGING: CT shows high grade Lt SCA disease and high grade RICA disease   IMPRESSION:  Principal Problem:  *Syncope, ? Secondary to PVD  Active Problems:  Cardiomyopathy, mixed, EF responded to BiV, last EF 55% by 2D 2/12  Biventricular ICD (implantable cardiac defibrillator) in place  PVD, known severe, (previuosly asymptomatic) LSCA disease, now with "high grade" RICA disease.  HYPERLIPIDEMIA, Hx of Niacin and statin intol  HTN, Nl renal arteries 3/12  CAD, 75% LAD, 75% RCA 2009, low risk Myoview 2/12  COPD  Vertigo  Hypokalemia  Bronchitis, recent flare   RECOMMENDATION: We will get office records, including carotid and upper extremity dopplers, to chart. Further recommendations pending. [employer] has been called to interrogate ICD,  Time Spent Directly with Patient: 30 minutes  KILROY,LUKE K 07/28/2011, 4:29 PM   PT. Seen and examined.  Hx of recent vertigo still symptomatic. Syncopal event at yesterday. No neurological  losses. CT scan shows HIGH GRADE RCA stenosis . Usually pt  Have neuro. Symptoms not syncope.Marland Kitchen   AICD shows nl function and no arrhythmias.       Marked dizziness with rapid positional changes of head. No nystagmus grip equal. R>L carotid and bilat. Subclavian bruit.  RRR no murmur   Will prob. Need carotid angio. Prior to D/c. Will disc. With Dr. Allyson Sabal.

## 2011-07-28 NOTE — Progress Notes (Signed)
*  PRELIMINARY RESULTS* Echocardiogram 2D Echocardiogram has been performed.  Katheren Puller 07/28/2011, 9:28 AM

## 2011-07-28 NOTE — ED Provider Notes (Signed)
Medical screening examination/treatment/procedure(s) were conducted as a shared visit with non-physician practitioner(s) and myself.  I personally evaluated the patient during the encounter    .Face to face Exam:  General:  A&Ox3 HEENT:  Atraumatic Resp:  Normal effort Abd:  Nondistended Neuro:No focal deficits     Nelia Shi, MD 07/28/11 2245

## 2011-07-29 LAB — BASIC METABOLIC PANEL
BUN: 18 mg/dL (ref 6–23)
CO2: 29 mEq/L (ref 19–32)
Calcium: 9.1 mg/dL (ref 8.4–10.5)
Chloride: 105 mEq/L (ref 96–112)
Creatinine, Ser: 1.02 mg/dL (ref 0.50–1.10)
Glucose, Bld: 106 mg/dL — ABNORMAL HIGH (ref 70–99)

## 2011-07-29 LAB — CBC
HCT: 38.3 % (ref 36.0–46.0)
Hemoglobin: 12.6 g/dL (ref 12.0–15.0)
MCH: 27.8 pg (ref 26.0–34.0)
MCV: 84.5 fL (ref 78.0–100.0)
Platelets: 250 10*3/uL (ref 150–400)
RBC: 4.53 MIL/uL (ref 3.87–5.11)

## 2011-07-29 MED ORDER — HYDRALAZINE HCL 20 MG/ML IJ SOLN
2.5000 mg | Freq: Once | INTRAMUSCULAR | Status: AC
  Administered 2011-07-30: 3 mg via INTRAVENOUS
  Filled 2011-07-29: qty 1

## 2011-07-29 NOTE — Progress Notes (Signed)
Discussed PV angiogram with possible Lt [facility] PTA with Dr Allyson Sabal, Unable to get on the schedule for Wednesday, she is on for 1pm Thursday.  Corine Shelter PA-C 07/29/2011 8:41 AM

## 2011-07-29 NOTE — Progress Notes (Signed)
Subjective:  No CP/SOB or further dizziness  Objective:  Temp:  [97.9 F (36.6 C)-98.3 F (36.8 C)] 97.9 F (36.6 C) (02/12 0600) Pulse Rate:  [64-68] 64  (02/12 0600) Resp:  [18-21] 18  (02/12 0600) BP: (141-167)/(46-89) 164/89 mmHg (02/12 0600) SpO2:  [90 %-99 %] 98 % (02/12 0600) Weight change:   Intake/Output from previous day:    Intake/Output from this shift:    Physical Exam: General appearance: alert and cooperative Neck: no adenopathy, no carotid bruit, no JVD, supple, symmetrical, trachea midline, thyroid not enlarged, symmetric, no tenderness/mass/nodules and Right carotid bruit and Left subclavian bruit Lungs: clear to auscultation bilaterally Heart: regular rate and rhythm, S1, S2 normal, no murmur, click, rub or gallop Neurologic: Grossly normal  Lab Results: Results for orders placed during the hospital encounter of 07/27/11 (from the past 48 hour(s))  BASIC METABOLIC PANEL     Status: Abnormal   Collection Time   07/27/11  5:25 PM      Component Value Range Comment   Sodium 141  135 - 145 (mEq/L)    Potassium 3.0 (*) 3.5 - 5.1 (mEq/L)    Chloride 101  96 - 112 (mEq/L)    CO2 30  19 - 32 (mEq/L)    Glucose, Bld 91  70 - 99 (mg/dL)    BUN 23  6 - 23 (mg/dL)    Creatinine, Ser 1.61  0.50 - 1.10 (mg/dL)    Calcium 9.4  8.4 - 10.5 (mg/dL)    GFR calc non Af Amer 55 (*) >90 (mL/min)    GFR calc Af Amer 63 (*) >90 (mL/min)   CBC     Status: Abnormal   Collection Time   07/27/11  5:25 PM      Component Value Range Comment   WBC 10.6 (*) 4.0 - 10.5 (K/uL)    RBC 4.91  3.87 - 5.11 (MIL/uL)    Hemoglobin 13.9  12.0 - 15.0 (g/dL)    HCT 09.6  04.5 - 40.9 (%)    MCV 84.9  78.0 - 100.0 (fL)    MCH 28.3  26.0 - 34.0 (pg)    MCHC 33.3  30.0 - 36.0 (g/dL)    RDW 81.1  91.4 - 78.2 (%)    Platelets 305  150 - 400 (K/uL)   POCT I-STAT TROPONIN I     Status: Normal   Collection Time   07/27/11  7:20 PM      Component Value Range Comment   Troponin i, poc 0.00   0.00 - 0.08 (ng/mL)    Comment 3            CARDIAC PANEL(CRET KIN+CKTOT+MB+TROPI)     Status: Abnormal   Collection Time   07/27/11 11:30 PM      Component Value Range Comment   Total CK 107  7 - 177 (U/L)    CK, MB 4.4 (*) 0.3 - 4.0 (ng/mL)    Troponin I <0.30  <0.30 (ng/mL)    Relative Index 4.1 (*) 0.0 - 2.5    CARDIAC PANEL(CRET KIN+CKTOT+MB+TROPI)     Status: Abnormal   Collection Time   07/28/11  6:20 AM      Component Value Range Comment   Total CK 119  7 - 177 (U/L)    CK, MB 4.5 (*) 0.3 - 4.0 (ng/mL)    Troponin I <0.30  <0.30 (ng/mL)    Relative Index 3.8 (*) 0.0 - 2.5    BASIC METABOLIC PANEL  Status: Abnormal   Collection Time   07/28/11  6:20 AM      Component Value Range Comment   Sodium 137  135 - 145 (mEq/L)    Potassium 3.7  3.5 - 5.1 (mEq/L)    Chloride 101  96 - 112 (mEq/L)    CO2 28  19 - 32 (mEq/L)    Glucose, Bld 98  70 - 99 (mg/dL)    BUN 19  6 - 23 (mg/dL)    Creatinine, Ser 1.61  0.50 - 1.10 (mg/dL)    Calcium 9.3  8.4 - 10.5 (mg/dL)    GFR calc non Af Amer 53 (*) >90 (mL/min)    GFR calc Af Amer 61 (*) >90 (mL/min)   CBC     Status: Normal   Collection Time   07/28/11  6:20 AM      Component Value Range Comment   WBC 10.1  4.0 - 10.5 (K/uL)    RBC 4.74  3.87 - 5.11 (MIL/uL)    Hemoglobin 13.1  12.0 - 15.0 (g/dL)    HCT 09.6  04.5 - 40.9 (%)    MCV 85.4  78.0 - 100.0 (fL)    MCH 27.6  26.0 - 34.0 (pg)    MCHC 32.3  30.0 - 36.0 (g/dL)    RDW 81.1  91.4 - 78.2 (%)    Platelets 286  150 - 400 (K/uL)   CARDIAC PANEL(CRET KIN+CKTOT+MB+TROPI)     Status: Abnormal   Collection Time   07/28/11  3:12 PM      Component Value Range Comment   Total CK 107  7 - 177 (U/L)    CK, MB 4.4 (*) 0.3 - 4.0 (ng/mL)    Troponin I <0.30  <0.30 (ng/mL)    Relative Index 4.1 (*) 0.0 - 2.5    MRSA PCR SCREENING     Status: Normal   Collection Time   07/29/11  1:57 AM      Component Value Range Comment   MRSA by PCR NEGATIVE  NEGATIVE    CBC     Status: Normal    Collection Time   07/29/11  4:55 AM      Component Value Range Comment   WBC 8.0  4.0 - 10.5 (K/uL)    RBC 4.53  3.87 - 5.11 (MIL/uL)    Hemoglobin 12.6  12.0 - 15.0 (g/dL)    HCT 95.6  21.3 - 08.6 (%)    MCV 84.5  78.0 - 100.0 (fL)    MCH 27.8  26.0 - 34.0 (pg)    MCHC 32.9  30.0 - 36.0 (g/dL)    RDW 57.8  46.9 - 62.9 (%)    Platelets 250  150 - 400 (K/uL)   BASIC METABOLIC PANEL     Status: Abnormal   Collection Time   07/29/11  4:55 AM      Component Value Range Comment   Sodium 142  135 - 145 (mEq/L)    Potassium 3.6  3.5 - 5.1 (mEq/L)    Chloride 105  96 - 112 (mEq/L)    CO2 29  19 - 32 (mEq/L)    Glucose, Bld 106 (*) 70 - 99 (mg/dL)    BUN 18  6 - 23 (mg/dL)    Creatinine, Ser 5.28  0.50 - 1.10 (mg/dL)    Calcium 9.1  8.4 - 10.5 (mg/dL)    GFR calc non Af Amer 53 (*) >90 (mL/min)    GFR calc Af Denyse Dago  62 (*) >90 (mL/min)     Imaging: Imaging results have been reviewed  Assessment/Plan:   1. Principal Problem: 2.  *Syncope 3. Active Problems: 4.  HYPERLIPIDEMIA, Hx of Niacin and statin intol 5.  HTN, Nl renal arteries 3/12 6.  CAD, 75% LAD, 75% RCA 2009, low risk Myoview 2/12 7.  COPD 8.  Vertigo 9.  Hypokalemia 10.  Cardiomyopathy, mixed, EF responded to BiV, last EF 55% by 2D 2/12 11.  Biventricular ICD (implantable cardiac defibrillator) in place 12.  PVD, known severe, (previuosly asymptomatic) LSCA disease, now with "high grade" RICA disease. 13.  Bronchitis, recent flare 14.   Time Spent Directly with Patient:  25 minutes  Length of Stay:  LOS: 2 days   Pt well known to me. I have been taking care of her for close to 20 years. She has known CAD and PVOD s/p BiV ICD placement in [place] by Dr. Sherren Kerns in 2010 at my request with an impressive hemodynamic response and increase in LVEF. I have stented her lower extremities in the past and have been closely following her carotid and subclavian disease in the office both clinically and by serial surveillance  duplex US. She has been asymptomatic. She was admitted with syncope by Dr[facility]), on Sunday 2/10, and we were consulted yesterday at the pts request. Her ICD was interrogated and no arrhythmias were found. CTA of her neck confirm what I have known about her carotid and subclavian disease (will get office records). Her symptoms to me sound like posterior circulation symptoms (I think that her RICA stenosis is true, true and unrelated). Since she is now symptomatic I think she deserves angiography and potential intervention of her LSCA. I will also angiogram her carotid circulation. I have discussed this with the pt and she agrees. Will set up for tomorrow at [facility] if schedule will allow.   Runell Gess 07/29/2011, 7:24 AM

## 2011-07-29 NOTE — Progress Notes (Signed)
Subjective: No dizziness or syncope. No other complaints.  Objective: Vital signs in last 24 hours: Temp:  [97.9 F (36.6 C)-98.3 F (36.8 C)] 97.9 F (36.6 C) (02/12 0600) Pulse Rate:  [64-68] 64  (02/12 0600) Resp:  [18-21] 18  (02/12 0600) BP: (141-164)/(60-89) 164/89 mmHg (02/12 0600) SpO2:  [90 %-98 %] 98 % (02/12 0600) Weight:  [86.183 kg (190 lb)] 86.183 kg (190 lb) (02/12 0600) Weight change:  Last BM Date: 07/28/11  Intake/Output from previous day:       Physical Exam: General: Alert, awake, oriented x3, in no acute distress. HEENT: No bruits, no goiter. Heart: Regular rate and rhythm, without murmurs, rubs, gallops. Lungs: Clear to auscultation bilaterally. Abdomen: Soft, nontender, nondistended, positive bowel sounds. Extremities: No clubbing cyanosis or edema with positive pedal pulses.    Lab Results: Basic Metabolic Panel:  Basename 07/29/11 0455 07/28/11 0620  NA 142 137  K 3.6 3.7  CL 105 101  CO2 29 28  GLUCOSE 106* 98  BUN 18 19  CREATININE 1.02 1.03  CALCIUM 9.1 9.3  MG -- --  PHOS -- --   CBC:  Basename 07/29/11 0455 07/28/11 0620  WBC 8.0 10.1  NEUTROABS -- --  HGB 12.6 13.1  HCT 38.3 40.5  MCV 84.5 85.4  PLT 250 286   Cardiac Enzymes:  Basename 07/28/11 1512 07/28/11 0620 07/27/11 2330  CKTOTAL 107 119 107  CKMB 4.4* 4.5* 4.4*  CKMBINDEX -- -- --  TROPONINI <0.30 <0.30 <0.30    Studies/Results: Ct Angio Head W/cm &/or Wo Cm  07/28/2011  *RADIOLOGY REPORT*  Clinical Data:  SYNCOPAL EPISODE WHILE WALKING UP TO TURN ON A LIGHT.  CT ANGIOGRAPHY HEAD AND NECK  Technique:  Multidetector CT imaging of the head and neck was performed using the standard protocol during bolus administration of intravenous contrast.  Multiplanar CT image reconstructions including MIPs were obtained to evaluate the vascular anatomy. Carotid stenosis measurements (when applicable) are obtained utilizing NASCET criteria, using the distal internal carotid  diameter as the denominator.  Contrast: OMNIPAQUE IOHEXOL 350 MG/ML IV SOLN  Comparison:  CT head without contrast 07/27/2011.  CTA NECK  Findings:  A standard three-vessel arch configuration is present.  Calcifications are present at the origin of the innominate and left common carotid artery.  There are dense calcifications at the origin of the left subclavian artery with near total loss of contrast volume.  A tandem stenosis of the left subclavian artery measures 70% relative to the distal vessel.  The left vertebral artery originates at the distal aspect of this stenosis next to a calcification.  There is a high-grade stenosis of the proximal left vertebral artery.  The right vertebral artery origin is unremarkable.  The right vertebral artery is the dominant vessel.  The left vertebral artery enters the spinal canal at C6.  The right vertebral artery enters at C4. There is irregularity of the left vertebral artery at the level of the arch of C1.  The vessel and bifurcates into the PICA and the left vertebral artery.  The right common carotid artery demonstrates minimal calcification without significant stenosis.  There is near complete loss of contrast within the proximal right internal carotid artery, compatible with a high-grade stenosis.  The remainder of the right internal carotid artery is slightly smaller than the left.  The left common carotid artery exhibits scattered calcifications along its course.  There are no significant stenoses.  At the bifurcation, the vessel is narrowed to 2.0  mm.  Compared with the distal vessel of 4.2 mm, there is no significant stenosis.   Review of the MIP images confirms the above findings.  IMPRESSION:  1.  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  2.  Vessel irregularity in the distal left vertebral artery at the bifurcation to the PICA.  No focal injury is evident. 3.  High-grade stenosis of the proximal right internal carotid  artery relative to the distal vessel. 4.  Approximately 50% stenosis of the left internal carotid artery at the bifurcation.  CTA HEAD  Findings:  The noncontrast CT head demonstrates scattered white matter hypoattenuation as before.  Atherosclerotic calcifications are present within the cavernous carotid arteries bilaterally without significant stenosis relative to the distal vessels.  The A1 and M1 segments are normal.  The anterior communicating artery is patent.  The MCA bifurcations are within normal limits.  There is segmental irregularity within the MCA branches bilaterally without a significant proximal stenosis or occlusion.  The basilar artery is within normal limits.  Both posterior cerebral arteries originate from basilar tip.  There is irregularity within the PCA branches bilaterally without significant proximal stenosis or occlusion.  The dural sinuses fill normally.  The postcontrast images demonstrate no areas of pathologic enhancement.   Review of the MIP images confirms the above findings.  IMPRESSION:  1.  Atherosclerotic disease within the cavernous carotid arteries bilaterally without significant stenosis relative to the distal vessels. 2.  Moderate small vessel disease. 3.  No significant proximal stenosis, aneurysm, or branch vessel occlusion. 4.  Extensive white matter disease, likely related to a small vessel disease.  Original Report Authenticated By: Jamesetta Orleans. MATTERN, M.D.   Dg Shoulder Right  07/27/2011  *RADIOLOGY REPORT*  Clinical Data: Fall, right shoulder pain  RIGHT SHOULDER - 2+ VIEW  Comparison: None.  Findings: Three views of the right shoulder submitted.  No acute fracture or subluxation.  Mild spurring of the right humeral head.  Mild degenerative changes right AC joint.  IMPRESSION: No acute fracture or subluxation.  Degenerative changes as described above.  Original Report Authenticated By: Natasha Mead, M.D.   Ct Head Wo Contrast  07/27/2011  *RADIOLOGY REPORT*   Clinical Data:  Fall.  Loss of consciousness.  CT HEAD WITHOUT CONTRAST CT CERVICAL SPINE WITHOUT CONTRAST  Technique:  Multidetector CT imaging of the head and cervical spine was performed following the standard protocol without intravenous contrast.  Multiplanar CT image reconstructions of the cervical spine were also generated.  Comparison:  CT head 10/29/2009.  CT HEAD  Findings: Mild periventricular and subcortical white matter hypoattenuation bilaterally is stable.  No acute infarct, hemorrhage, or mass lesion is evident.  The ventricles are proportionate to the degree of atrophy.  No significant extra-axial fluid collection is present.  High-density fluid is present within the left maxillary sinus.  No definite fracture is evident.  Fluid posteriorly in the right maxillary sinus is not as dense.  The remaining paranasal sinuses and mastoid air cells are clear.  The visualized osseous skull is intact.  Atherosclerotic calcifications are present in the cavernous carotid arteries bilaterally.  IMPRESSION:  1.  No acute intracranial abnormality or significant interval change. 2.  Stable white matter disease. 3.  High-density fluid within the left maxillary sinus.  Although this could be related to infection or formation, occult fracture is not excluded.  If the patient has tenderness over the left face, CT of the face may be useful for further  evaluation.  CT CERVICAL SPINE  Findings: The cervical spine is imaged from skull base through the midbody of T1.  Chronic degenerative changes and sclerotic marrow changes are evident at C1-2.  Multilevel facet degenerative changes are evident bilaterally.  Uncovertebral disease is present on the left at C4-5 on the right at C5-6.  Atherosclerotic calcifications are present at the carotid bifurcations bilaterally.  The lung apices are clear.  IMPRESSION:  1.  Moderate spondylosis of the cervical spine, predominately at C4- 5 and C5-6. 2.  Moderate degenerative changes  along the anterior arch of C1-2.  Original Report Authenticated By: Jamesetta Orleans. MATTERN, M.D.   Ct Angio Neck W/cm &/or Wo/cm  07/28/2011  *RADIOLOGY REPORT*  Clinical Data:  SYNCOPAL EPISODE WHILE WALKING UP TO TURN ON A LIGHT.  CT ANGIOGRAPHY HEAD AND NECK  Technique:  Multidetector CT imaging of the head and neck was performed using the standard protocol during bolus administration of intravenous contrast.  Multiplanar CT image reconstructions including MIPs were obtained to evaluate the vascular anatomy. Carotid stenosis measurements (when applicable) are obtained utilizing NASCET criteria, using the distal internal carotid diameter as the denominator.  Contrast: OMNIPAQUE IOHEXOL 350 MG/ML IV SOLN  Comparison:  CT head without contrast 07/27/2011.  CTA NECK  Findings:  A standard three-vessel arch configuration is present.  Calcifications are present at the origin of the innominate and left common carotid artery.  There are dense calcifications at the origin of the left subclavian artery with near total loss of contrast volume.  A tandem stenosis of the left subclavian artery measures 70% relative to the distal vessel.  The left vertebral artery originates at the distal aspect of this stenosis next to a calcification.  There is a high-grade stenosis of the proximal left vertebral artery.  The right vertebral artery origin is unremarkable.  The right vertebral artery is the dominant vessel.  The left vertebral artery enters the spinal canal at C6.  The right vertebral artery enters at C4. There is irregularity of the left vertebral artery at the level of the arch of C1.  The vessel and bifurcates into the PICA and the left vertebral artery.  The right common carotid artery demonstrates minimal calcification without significant stenosis.  There is near complete loss of contrast within the proximal right internal carotid artery, compatible with a high-grade stenosis.  The remainder of the right  internal carotid artery is slightly smaller than the left.  The left common carotid artery exhibits scattered calcifications along its course.  There are no significant stenoses.  At the bifurcation, the vessel is narrowed to 2.0 mm.  Compared with the distal vessel of 4.2 mm, there is no significant stenosis.   Review of the MIP images confirms the above findings.  IMPRESSION:  1.  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  2.  Vessel irregularity in the distal left vertebral artery at the bifurcation to the PICA.  No focal injury is evident. 3.  High-grade stenosis of the proximal right internal carotid artery relative to the distal vessel. 4.  Approximately 50% stenosis of the left internal carotid artery at the bifurcation.  CTA HEAD  Findings:  The noncontrast CT head demonstrates scattered white matter hypoattenuation as before.  Atherosclerotic calcifications are present within the cavernous carotid arteries bilaterally without significant stenosis relative to the distal vessels.  The A1 and M1 segments are normal.  The anterior communicating artery is patent.  The MCA bifurcations are  within normal limits.  There is segmental irregularity within the MCA branches bilaterally without a significant proximal stenosis or occlusion.  The basilar artery is within normal limits.  Both posterior cerebral arteries originate from basilar tip.  There is irregularity within the PCA branches bilaterally without significant proximal stenosis or occlusion.  The dural sinuses fill normally.  The postcontrast images demonstrate no areas of pathologic enhancement.   Review of the MIP images confirms the above findings.  IMPRESSION:  1.  Atherosclerotic disease within the cavernous carotid arteries bilaterally without significant stenosis relative to the distal vessels. 2.  Moderate small vessel disease. 3.  No significant proximal stenosis, aneurysm, or branch vessel occlusion. 4.  Extensive  white matter disease, likely related to a small vessel disease.  Original Report Authenticated By: Jamesetta Orleans. MATTERN, M.D.   Ct Cervical Spine Wo Contrast  07/27/2011  *RADIOLOGY REPORT*  Clinical Data:  Fall.  Loss of consciousness.  CT HEAD WITHOUT CONTRAST CT CERVICAL SPINE WITHOUT CONTRAST  Technique:  Multidetector CT imaging of the head and cervical spine was performed following the standard protocol without intravenous contrast.  Multiplanar CT image reconstructions of the cervical spine were also generated.  Comparison:  CT head 10/29/2009.  CT HEAD  Findings: Mild periventricular and subcortical white matter hypoattenuation bilaterally is stable.  No acute infarct, hemorrhage, or mass lesion is evident.  The ventricles are proportionate to the degree of atrophy.  No significant extra-axial fluid collection is present.  High-density fluid is present within the left maxillary sinus.  No definite fracture is evident.  Fluid posteriorly in the right maxillary sinus is not as dense.  The remaining paranasal sinuses and mastoid air cells are clear.  The visualized osseous skull is intact.  Atherosclerotic calcifications are present in the cavernous carotid arteries bilaterally.  IMPRESSION:  1.  No acute intracranial abnormality or significant interval change. 2.  Stable white matter disease. 3.  High-density fluid within the left maxillary sinus.  Although this could be related to infection or formation, occult fracture is not excluded.  If the patient has tenderness over the left face, CT of the face may be useful for further evaluation.  CT CERVICAL SPINE  Findings: The cervical spine is imaged from skull base through the midbody of T1.  Chronic degenerative changes and sclerotic marrow changes are evident at C1-2.  Multilevel facet degenerative changes are evident bilaterally.  Uncovertebral disease is present on the left at C4-5 on the right at C5-6.  Atherosclerotic calcifications are present at the  carotid bifurcations bilaterally.  The lung apices are clear.  IMPRESSION:  1.  Moderate spondylosis of the cervical spine, predominately at C4- 5 and C5-6. 2.  Moderate degenerative changes along the anterior arch of C1-2.  Original Report Authenticated By: Jamesetta Orleans. MATTERN, M.D.    Medications: Scheduled Meds:    . aspirin EC  81 mg Oral Daily  . clopidogrel  75 mg Oral Daily  . DULoxetine  60 mg Oral BID  . metoprolol tartrate  25 mg Oral BID  . sodium chloride  3 mL Intravenous Q12H  . sodium chloride  3 mL Intravenous Q12H   Continuous Infusions:  PRN Meds:.sodium chloride, ALPRAZolam, meclizine, sodium chloride  Assessment/Plan:  Principal Problem:  *Syncope Active Problems:  HYPERLIPIDEMIA, Hx of Niacin and statin intol  HTN, Nl renal arteries 3/12  CAD, 75% LAD, 75% RCA 2009, low risk Myoview 2/12  COPD  Vertigo  Hypokalemia  Cardiomyopathy, mixed, EF responded to BiV,  last EF 55% by 2D 2/12  Biventricular ICD (implantable cardiac defibrillator) in place  PVD, known severe, (previuosly asymptomatic) LSCA disease, now with "high grade" RICA disease.  Bronchitis, recent flare   Syncope Patient has significant carotid artery disease bilaterally (left more than right) . Dr. Allyson Sabal from cardiology consulted. Likely she will undergo angiogram tomorrow in [facility].  Cardiomyopathy Biventricular ICD in place, echocardiogram showed LVEF of 53%. No symptoms or signs of decompensation or fluid overload.  Hyperlipidemia Check fasting lipid profile.   LOS: 2 days   [facility] Pager: [phone] 07/29/2011, 9:22 AM

## 2011-07-29 NOTE — Progress Notes (Signed)
Sydney Phillips C. Jef Futch, MD, FACC Attending Cardiologist [facility]  

## 2011-07-29 NOTE — Progress Notes (Signed)
UR complete 

## 2011-07-30 LAB — BASIC METABOLIC PANEL
CO2: 27 mEq/L (ref 19–32)
Calcium: 9.5 mg/dL (ref 8.4–10.5)
Chloride: 101 mEq/L (ref 96–112)
Sodium: 137 mEq/L (ref 135–145)

## 2011-07-30 LAB — LIPID PANEL
HDL: 29 mg/dL — ABNORMAL LOW (ref >39)
Total CHOL/HDL Ratio: 7.8 RATIO
VLDL: 51 mg/dL — ABNORMAL HIGH (ref 0–40)

## 2011-07-30 MED ORDER — ASPIRIN 81 MG PO CHEW: 324.0000 mg | CHEWABLE_TABLET | ORAL | Status: DC

## 2011-07-30 MED ORDER — DIPHENHYDRAMINE HCL 25 MG PO CAPS
25.0000 mg | ORAL_CAPSULE | Freq: Four times a day (QID) | ORAL | Status: DC | PRN
  Administered 2011-07-30 (×2): 25 mg via ORAL
  Filled 2011-07-30 (×2): qty 1

## 2011-07-30 MED ORDER — ROSUVASTATIN CALCIUM 20 MG PO TABS
20.0000 mg | ORAL_TABLET | Freq: Every day | ORAL | Status: DC
  Administered 2011-07-30 – 2011-07-31 (×2): 20 mg via ORAL
  Filled 2011-07-30 (×4): qty 1

## 2011-07-30 MED ORDER — HYDROCODONE-ACETAMINOPHEN 5-325 MG PO TABS
1.0000 | ORAL_TABLET | Freq: Four times a day (QID) | ORAL | Status: DC | PRN
  Administered 2011-07-30: 2 via ORAL
  Filled 2011-07-30: qty 2

## 2011-07-30 MED ORDER — AMLODIPINE BESYLATE 10 MG PO TABS
10.0000 mg | ORAL_TABLET | Freq: Every day | ORAL | Status: DC
  Administered 2011-07-30 – 2011-08-01 (×3): 10 mg via ORAL
  Filled 2011-07-30 (×3): qty 1

## 2011-07-30 MED ORDER — FUROSEMIDE 40 MG PO TABS
40.0000 mg | ORAL_TABLET | Freq: Every day | ORAL | Status: DC
  Administered 2011-07-30 – 2011-08-01 (×3): 40 mg via ORAL
  Filled 2011-07-30 (×3): qty 1

## 2011-07-30 MED ORDER — SODIUM CHLORIDE 0.9 % IV SOLN
INTRAVENOUS | Status: DC
  Administered 2011-07-30 – 2011-07-31 (×2): via INTRAVENOUS

## 2011-07-30 MED ORDER — TRAMADOL HCL 50 MG PO TABS
100.0000 mg | ORAL_TABLET | Freq: Four times a day (QID) | ORAL | Status: DC | PRN
  Administered 2011-07-30: 100 mg via ORAL
  Filled 2011-07-30 (×2): qty 2

## 2011-07-30 NOTE — Progress Notes (Signed)
Subjective: No dizziness or syncope, concerned about blood pressure being up  Objective: Vital signs in last 24 hours: Temp:  [97.5 F (36.4 C)-97.9 F (36.6 C)] 97.5 F (36.4 C) (02/13 1420) Pulse Rate:  [59-114] 59  (02/13 1420) Resp:  [18-20] 20  (02/13 1420) BP: (137-198)/(52-89) 137/70 mmHg (02/13 1420) SpO2:  [92 %-100 %] 92 % (02/13 1420) Weight change:  Last BM Date: 07/30/11  Intake/Output from previous day: 02/12 0701 - 02/13 0700 In: 900 [P.O.:900] Out: -  Total I/O In: 480 [P.O.:480] Out: -    Physical Exam: General: Alert, awake, oriented x3, in no acute distress. HEENT: No bruits, no goiter. Heart: Regular rate and rhythm, without murmurs, rubs, gallops. Lungs: Clear to auscultation bilaterally. Abdomen: Soft, nontender, nondistended, positive bowel sounds. Extremities: No clubbing cyanosis or edema with positive pedal pulses.  Lab Results: Basic Metabolic Panel:  Basename 07/30/11 0515 07/29/11 0455  NA 137 142  K 3.6 3.6  CL 101 105  CO2 27 29  GLUCOSE 120* 106*  BUN 17 18  CREATININE 0.85 1.02  CALCIUM 9.5 9.1  MG -- --  PHOS -- --   CBC:  Basename 07/29/11 0455 07/28/11 0620  WBC 8.0 10.1  NEUTROABS -- --  HGB 12.6 13.1  HCT 38.3 40.5  MCV 84.5 85.4  PLT 250 286   Cardiac Enzymes:  Basename 07/28/11 1512 07/28/11 0620 07/27/11 2330  CKTOTAL 107 119 107  CKMB 4.4* 4.5* 4.4*  CKMBINDEX -- -- --  TROPONINI <0.30 <0.30 <0.30    Studies/Results: No results found.  Medications: Scheduled Meds:    . amLODipine  10 mg Oral Daily  . aspirin EC  81 mg Oral Daily  . clopidogrel  75 mg Oral Daily  . DULoxetine  60 mg Oral BID  . furosemide  40 mg Oral Daily  . hydrALAZINE  3 mg Intravenous Once  . metoprolol tartrate  25 mg Oral BID  . sodium chloride  3 mL Intravenous Q12H  . sodium chloride  3 mL Intravenous Q12H   Continuous Infusions:  PRN Meds:.sodium chloride, ALPRAZolam, diphenhydrAMINE, HYDROcodone-acetaminophen,  meclizine, sodium chloride, traMADol  Assessment/Plan:  Principal Problem:  *Syncope Active Problems:  HYPERLIPIDEMIA, Hx of Niacin and statin intol  HTN, Nl renal arteries 3/12  CAD, 75% LAD, 75% RCA 2009, low risk Myoview 2/12  COPD  Vertigo  Hypokalemia  Cardiomyopathy, mixed, EF responded to BiV, last EF 55% by 2D 2/12  Biventricular ICD (implantable cardiac defibrillator) in place  PVD, known severe, (previuosly asymptomatic) LSCA disease, now with "high grade" RICA disease.  Bronchitis, recent flare   1. Syncope Patient has significant carotid artery disease bilaterally (left more than right) . Dr. Allyson Sabal following , for angiogram tomorrow at [facility].  2. Cardiomyopathy-mixed Biventricular ICD in place, echocardiogram showed LVEF of 53%. No symptoms or signs of decompensation or fluid overload. Will resume Lasix by mouth at a lower dose  3. Uncontrolled hypertension: Continue beta blocker, add amlodipine and low-dose Lasix  4. COPD stable  5. DVT prophylaxis: SCDs   LOS: 3 days   [facility] Pager: [phone] 07/30/2011, 4:37 PM

## 2011-07-30 NOTE — Progress Notes (Signed)
Attending notified of pt's  BP 192/89 and 191/60. Orders given initially for 3mg  hydralazine. Next order to hold off on medication until diastolic reaches 90 or above.Will continue to monitor and check vitals q30.

## 2011-07-30 NOTE — Progress Notes (Signed)
Pt denies SOB, chest pain; c/o headache, attending made aware, order placed.

## 2011-07-30 NOTE — Progress Notes (Signed)
Subjective: No further syncope, no chest pain, no SOB  Objective: Vital signs in last 24 hours: Temp:  [97.5 F (36.4 C)-97.9 F (36.6 C)] 97.5 F (36.4 C) (02/13 1420) Pulse Rate:  [59-114] 59  (02/13 1420) Resp:  [18-20] 20  (02/13 1420) BP: (137-198)/(52-89) 137/70 mmHg (02/13 1420) SpO2:  [92 %-100 %] 92 % (02/13 1420) Weight change:  Last BM Date: 07/30/11 Intake/Output from previous day: +900 02/12 0701 - 02/13 0700 In: 900 [P.O.:900] Out: -  Intake/Output this shift: Total I/O In: 480 [P.O.:480] Out: -   PE: General:A&O X 3, complains of speech slow but just rec'd extra benadryl Heart:S1S2 RRR Lungs:rhonchi in basses Abd:+ BS, soft non tender Ext:no edema, SCDs in place    Lab Results:  [facility] 07/29/11 0455 07/28/11 0620  WBC 8.0 10.1  HGB 12.6 13.1  HCT 38.3 40.5  PLT 250 286   BMET  Basename 07/30/11 0515 07/29/11 0455  NA 137 142  K 3.6 3.6  CL 101 105  CO2 27 29  GLUCOSE 120* 106*  BUN 17 18  CREATININE 0.85 1.02  CALCIUM 9.5 9.1    Basename 07/28/11 1512 07/28/11 0620  TROPONINI <0.30 <0.30    Lab Results  Component Value Date   CHOL 225* 07/30/2011   HDL 29* 07/30/2011   LDLCALC 145* 07/30/2011   TRIG 256* 07/30/2011   CHOLHDL 7.8 07/30/2011   No results found for this basename: HGBA1C     Lab Results  Component Value Date   TSH 1.025 12/02/2010    Hepatic Function Panel No results found for this basename: PROT,ALBUMIN,AST,ALT,ALKPHOS,BILITOT,BILIDIR,IBILI in the last 72 hours  Basename 07/30/11 0515  CHOL 225*   No results found for this basename: PROTIME in the last 72 hours    EKG: Orders placed in visit on 07/27/11  . EKG 12-LEAD    Studies/ResultsCT angio of the neck: :IMPRESSION:  1. High-grade stenosis of the left subclavian artery and left  vertebral artery.  2. Vessel irregularity in the distal left vertebral artery at the  bifurcation to the PICA. No focal injury is evident.  3. High-grade stenosis of the  proximal right internal carotid  artery relative to the distal vessel.  4. Approximately 50% stenosis of the left internal carotid artery  at the bifurcation.       Medications: I have reviewed the patient's current medications.    Marland Kitchen amLODipine  10 mg Oral Daily  . aspirin EC  81 mg Oral Daily  . clopidogrel  75 mg Oral Daily  . DULoxetine  60 mg Oral BID  . furosemide  40 mg Oral Daily  . hydrALAZINE  3 mg Intravenous Once  . metoprolol tartrate  25 mg Oral BID  . sodium chloride  3 mL Intravenous Q12H  . sodium chloride  3 mL Intravenous Q12H   Assessment/Plan: Patient Active Problem List  Diagnoses  . HYPERLIPIDEMIA, Hx of Niacin and statin intol  . DEPRESSION  . HTN, Nl renal arteries 3/12  . CAD, 75% LAD, 75% RCA 2009, low risk Myoview 2/12  . ACUTE SINUSITIS, UNSPECIFIED  . ALLERGIC RHINITIS  . COPD  . GERD  . CONSTIPATION  . HIP PAIN, BILATERAL  . SPINAL STENOSIS  . BACK PAIN, LUMBAR  . FIBROMYALGIA  . INSOMNIA  . WEIGHT GAIN  . DYSPNEA  . COLONIC POLYPS, HX OF  . Syncope  . Vertigo  . Hypokalemia  . Cardiomyopathy, mixed, EF responded to BiV, last EF 55% by 2D 2/12  .  Biventricular ICD (implantable cardiac defibrillator) in place  . PVD, known severe, (previuosly asymptomatic) LSCA disease, now with "high grade" RICA disease.  . Bronchitis, recent flare   PLAN: per Dr. Allyson Sabal: She has known CAD and PVOD s/p BiV ICD placement in [place] by Dr. Sherren Kerns in 2010 at my request with an impressive hemodynamic response and increase in LVEF. I have stented her lower extremities in the past and have been closely following her carotid and subclavian disease in the office both clinically and by serial surveillance duplex US. She has been asymptomatic. She was admitted with syncope by Dr. Eldridge Dace [facility]), on Sunday 2/10, and we were consulted yesterday at the pts request. Her ICD was interrogated and no arrhythmias were found. CTA of her neck confirm what I have known  about her carotid and subclavian disease (will get office records). Her symptoms to me sound like posterior circulation symptoms (I think that her RICA stenosis is true, true and unrelated). Since she is now symptomatic I think she deserves angiography and potential intervention of her LSCA. I will also angiogram her carotid circulation. I have discussed this with the pt and she agrees.   She is scheduled for 1300 07/31/11.  LDL=145 She needs statin. Will check LFTs. BP 183 systolic this AM   LOS: 3 days   INGOLD,LAURA R 07/30/2011, 4:35 PM     Patient seen and examined. Agree with assessment and plan. No further presyncope or syncope. No chest pain or dyspnea. For PV angio tomorrow of subclavian and carotid circulation.  Will add generic statin with target LDL <70. Keep NPO p MN for procedure.   Lennette Bihari, MD, [facility] 07/30/2011 4:54 PM

## 2011-07-31 ENCOUNTER — Encounter (HOSPITAL_COMMUNITY)
Admission: EM | Disposition: A | Discharge status: Home / Self Care | Payer: Self-pay | Source: Home / Self Care | Attending: Internal Medicine

## 2011-07-31 HISTORY — PX: CAROTID ANGIOGRAM: SHX5504

## 2011-07-31 HISTORY — PX: SUBCLAVIAN STENT PLACEMENT: SUR1038

## 2011-07-31 LAB — BASIC METABOLIC PANEL
BUN: 19 mg/dL (ref 6–23)
Chloride: 104 mEq/L (ref 96–112)
GFR calc non Af Amer: 55 mL/min — ABNORMAL LOW (ref >90)
Glucose, Bld: 105 mg/dL — ABNORMAL HIGH (ref 70–99)
Potassium: 4.1 mEq/L (ref 3.5–5.1)

## 2011-07-31 LAB — CBC
HCT: 38.8 % (ref 36.0–46.0)
MCV: 85.1 fL (ref 78.0–100.0)
RBC: 4.56 MIL/uL (ref 3.87–5.11)
WBC: 10.2 10*3/uL (ref 4.0–10.5)

## 2011-07-31 LAB — HEPATIC FUNCTION PANEL
ALT: 11 U/L (ref 0–35)
Albumin: 3.2 g/dL — ABNORMAL LOW (ref 3.5–5.2)
Alkaline Phosphatase: 103 U/L (ref 39–117)
Total Protein: 6.3 g/dL (ref 6.0–8.3)

## 2011-07-31 LAB — POCT ACTIVATED CLOTTING TIME: Activated Clotting Time: 182 seconds

## 2011-07-31 SURGERY — ANGIOGRAM, LOWER EXTREMITY
Anesthesia: LOCAL

## 2011-07-31 SURGERY — CAROTID ANGIOGRAM
Anesthesia: Choice

## 2011-07-31 MED ORDER — HEPARIN SODIUM (PORCINE) 1000 UNIT/ML IJ SOLN
INTRAMUSCULAR | Status: AC
  Filled 2011-07-31: qty 1

## 2011-07-31 MED ORDER — SODIUM CHLORIDE 0.9 % IV SOLN: INTRAVENOUS | Status: AC

## 2011-07-31 MED ORDER — HEPARIN (PORCINE) IN NACL 2-0.9 UNIT/ML-% IJ SOLN
INTRAMUSCULAR | Status: AC
  Filled 2011-07-31: qty 2000

## 2011-07-31 MED ORDER — CLOPIDOGREL BISULFATE 75 MG PO TABS
75.0000 mg | ORAL_TABLET | Freq: Every day | ORAL | Status: DC
  Administered 2011-08-01: 75 mg via ORAL
  Filled 2011-07-31: qty 1

## 2011-07-31 MED ORDER — LIDOCAINE HCL (PF) 1 % IJ SOLN
INTRAMUSCULAR | Status: AC
  Filled 2011-07-31: qty 30

## 2011-07-31 MED ORDER — NITROGLYCERIN 0.2 MG/ML ON CALL CATH LAB
INTRAVENOUS | Status: AC
  Filled 2011-07-31: qty 1

## 2011-07-31 MED ORDER — ONDANSETRON HCL 4 MG/2ML IJ SOLN: 4.0000 mg | Freq: Four times a day (QID) | INTRAMUSCULAR | Status: DC | PRN

## 2011-07-31 MED ORDER — ASPIRIN EC 325 MG PO TBEC
325.0000 mg | DELAYED_RELEASE_TABLET | Freq: Every day | ORAL | Status: DC
  Administered 2011-08-01: 325 mg via ORAL
  Filled 2011-07-31: qty 1

## 2011-07-31 MED ORDER — ACETAMINOPHEN 325 MG PO TABS: 650.0000 mg | ORAL_TABLET | ORAL | Status: DC | PRN

## 2011-07-31 NOTE — H&P (Signed)
    Pt was reexamined and existing H & P reviewed. No changes found.  Runell Gess, MD [facility] 07/31/2011 3:50 PM

## 2011-07-31 NOTE — Progress Notes (Signed)
Subjective: No further dizziness, but has also not really gotten up  Objective: Vital signs in last 24 hours: Temp:  [97.5 F (36.4 C)-98.5 F (36.9 C)] 98.5 F (36.9 C) (02/14 0557) Pulse Rate:  [59-70] 66  (02/14 0557) Resp:  [18-20] 18  (02/14 0557) BP: (137-148)/(55-70) 148/55 mmHg (02/14 0557) SpO2:  [92 %-100 %] 100 % (02/14 0557) Weight change:  Last BM Date: 07/30/11  Intake/Output from previous day: 02/13 0701 - 02/14 0700 In: 720 [P.O.:720] Out: 1950 [Urine:1950]   Physical Exam: General: Alert, awake, oriented x3, in no acute distress. HEENT: No bruits, no goiter. Heart: Regular rate and rhythm, without murmurs, rubs, gallops. Lungs: Clear to auscultation bilaterally. Abdomen: Soft, nontender, nondistended, positive bowel sounds. Extremities: No clubbing cyanosis or edema with positive pedal pulses.  Lab Results: Basic Metabolic Panel:  Basename 07/31/11 0504 07/30/11 0515  NA 140 137  K 4.1 3.6  CL 104 101  CO2 27 27  GLUCOSE 105* 120*  BUN 19 17  CREATININE 1.00 0.85  CALCIUM 9.2 9.5  MG -- --  PHOS -- --   CBC:  Basename 07/31/11 0504 07/29/11 0455  WBC 10.2 8.0  NEUTROABS -- --  HGB 12.7 12.6  HCT 38.8 38.3  MCV 85.1 84.5  PLT 251 250   Cardiac Enzymes:  Basename 07/28/11 1512  CKTOTAL 107  CKMB 4.4*  CKMBINDEX --  TROPONINI <0.30    Studies/Results: No results found.  Medications: Scheduled Meds:    . amLODipine  10 mg Oral Daily  . aspirin  324 mg Oral Pre-Cath  . aspirin EC  81 mg Oral Daily  . clopidogrel  75 mg Oral Daily  . DULoxetine  60 mg Oral BID  . furosemide  40 mg Oral Daily  . metoprolol tartrate  25 mg Oral BID  . rosuvastatin  20 mg Oral QHS  . sodium chloride  3 mL Intravenous Q12H  . sodium chloride  3 mL Intravenous Q12H   Continuous Infusions:    . sodium chloride 75 mL/hr at 07/30/11 2000   PRN Meds:.sodium chloride, ALPRAZolam, diphenhydrAMINE, HYDROcodone-acetaminophen, meclizine, sodium  chloride, traMADol  Assessment/Plan:  Principal Problem:  *Syncope Active Problems:  HYPERLIPIDEMIA, Hx of Niacin and statin intol  HTN, Nl renal arteries 3/12  CAD, 75% LAD, 75% RCA 2009, low risk Myoview 2/12  COPD  Vertigo  Hypokalemia  Cardiomyopathy, mixed, EF responded to BiV, last EF 55% by 2D 2/12  Biventricular ICD (implantable cardiac defibrillator) in place  PVD, known severe, (previuosly asymptomatic) LSCA disease, now with "high grade" RICA disease.  Bronchitis, recent flare   1. Syncope/Dizziness Has high grade stenosis of L subclavian, L vertebral and R ICA  Dr. Allyson Sabal following , for angiogram today at [facility]. Suspect symptoms related to vertebral stenosis Continue ASA/statin  2. Cardiomyopathy-mixed Biventricular ICD in place, echocardiogram showed LVEF of 53%. No symptoms or signs of decompensation or fluid overload. Will resume Lasix by mouth at a lower dose  3. Hypertension: better controlled today, continue beta blocker, amlodipine and low-dose Lasix  4. COPD stable  5. Dyslipidemia: started statin, LDL:145  6. DVT prophylaxis: SCDs PT consult   LOS: 4 days   [facility] Pager: [phone] 07/31/2011, 10:18 AM

## 2011-07-31 NOTE — Op Note (Signed)
Sydney Phillips is a 76 y.o. female    [id number] LOCATION:  FACILITY: [facility]  PHYSICIAN: Nanetta Batty, M.D. 1936/03/11   DATE OF PROCEDURE:  07/31/2011  DATE OF DISCHARGE:  [facility]  Peripheral Vascular Intervention    History obtained from the patient  and chart review.Pt well known to me. I have been taking care of her for close to 20 years. She has known CAD and PVOD s/p BiV ICD placement in [place] by Dr. Sherren Kerns in 2010 at my request with an impressive hemodynamic response and increase in LVEF. I have stented her lower extremities in the past and have been closely following her carotid and subclavian disease in the office both clinically and by serial surveillance duplex US. She has been asymptomatic. She was admitted with syncope by Dr. Eldridge Dace [facility]), on Sunday 2/10, and we were consulted yesterday at the pts request. Her ICD was interrogated and no arrhythmias were found. CTA of her neck confirm what I have known about her carotid and subclavian disease (will get office records). Her symptoms to me sound like posterior circulation symptoms (I think that her RICA stenosis is true, true and unrelated). Since she is now symptomatic I think she deserves angiography and potential intervention of her LSCA. I will also angiogram her carotid circulation. I have discussed this with the pt and she agrees. Will set up for tomorrow at [facility] if schedule will allow.     PROCEDURE DESCRIPTION:  Is an The patient was brought to the second floor  [place] cath lab in the postabsorptive state. She was not  premedicated . Her right groin was prepped and shaved in usual sterile fashion. Xylocaine 1% was used  for local anesthesia. A 5 upgraded to 6 French sheath was inserted into the right femoral  artery using standard Seldinger technique. The patient received  5000 units  of heparin  intravenously.  A 5 French pigtail catheter was used for arch  angiography, and a 5 [place] JP 1 catheter was used for cerebral angiography, right and left carotid as well as subclavian angiography. Visipaque dye was used for the entirety of the case. Retrograde aortic pressure was monitored in the case. A total of 232 cc of contrast was administered to the patient. The ending ACT was 215.    HEMODYNAMICS:    AO SYSTOLIC/AO DIASTOLIC: 177/73   ANGIOGRAPHIC RESULTS:   1. Arch aortogram:-type  1 arch  2. Right internal carotid: 70%.  3. Left carotid: 30-40% proximal internal carotid artery stenosis  4. Left subclavian: 90% ostial left subclavian artery stenosis. 75% stenosis just proximal to the takeoff of a large vertebral artery. It should be noted that there was retrograde fill of the left vertebral artery with injection of the right innominate artery.  Procedure description:the 5 French sheath was exchanged for a 6 French 90 cm long shuttle sheath. Using a J. B-1 catheter and a versacore O35 wire the subclavian stenosis was crossed. Predilatation was performed with a 4 x 2 balloon. Stenting was performed with a 7 x 18 Genesis on Opta, balloon stent pre  mounted at nominal pressures.the final angiographic result with reduction of 90% ostial left subclavian artery stenosis to 0% residual residual with excellent flow. Was a residual 75% stenosis just beyond the stented segment before the take off of the vertebral branch this was high-risk anatomy because of the proximity to the take off of the vertebral artery. The 90 cm long 6 [place]  shuttle sheath was then exchanged over wire for a short 6 French sheath and the patient was allowed once stable condition.    IMPRESSION:[name] has moderate to severe internal carotid artery stenosis on the right probably not responsible for any of her symptoms. I suspect she has posterior circulation symptoms secondary to subclavian steal physiology related to her left subclavian artery stenosis. I successfully stented  the origin of her left subclavian artery but there is residual disease just beyond this proximal to the take off of her vertebral artery intervention on this is high risk given the proximity to the vertebral artery takeoff. Presently continue medical therapy. If she has continued symptoms we can decide to stage a more complex, high-risk procedure on her left subclavian artery. The patient left the lab in stable condition.  Runell Gess MD, [facility] 07/31/2011 5:19 PM

## 2011-07-31 NOTE — Progress Notes (Signed)
S: t transferred from [facility] today for an angio with stenting by Dr. Allyson Sabal. She is doing very well. She has no complaints except she is hungry. No pain at present in chest or LEs. No SOB, HA, or dizziness. No swelling at the groin stick site. She is lying flat as ordered. Son at bedside.  O: Appears very well. VSS. A&O x 3. RRR. Right groin dsg dry and intact. No bleeding or hematoma noted. Distal pulses intact, 2+ to the RLE. Extremity is pink, warm and dry.  A/P: 1. S/p stenting by cardio-doing well. Cont to monitor. Likely home tomorrow.  Maren Reamer, NP [facility]

## 2011-08-01 ENCOUNTER — Encounter (HOSPITAL_COMMUNITY): Payer: Self-pay

## 2011-08-01 ENCOUNTER — Encounter (HOSPITAL_COMMUNITY): Payer: Self-pay | Admitting: Cardiology

## 2011-08-01 DIAGNOSIS — I771 Stricture of artery: Secondary | ICD-10-CM

## 2011-08-01 DIAGNOSIS — Z95820 Peripheral vascular angioplasty status with implants and grafts: Secondary | ICD-10-CM

## 2011-08-01 HISTORY — DX: Stricture of artery: I77.1

## 2011-08-01 LAB — CBC
MCH: 27.8 pg (ref 26.0–34.0)
MCHC: 32.8 g/dL (ref 30.0–36.0)
Platelets: 271 10*3/uL (ref 150–400)
RDW: 15.1 % (ref 11.5–15.5)

## 2011-08-01 LAB — BASIC METABOLIC PANEL
BUN: 14 mg/dL (ref 6–23)
Calcium: 9.4 mg/dL (ref 8.4–10.5)
GFR calc Af Amer: 74 mL/min — ABNORMAL LOW (ref >90)
GFR calc non Af Amer: 64 mL/min — ABNORMAL LOW (ref >90)
Glucose, Bld: 90 mg/dL (ref 70–99)

## 2011-08-01 MED ORDER — MECLIZINE HCL 25 MG PO TABS: 25.0000 mg | ORAL_TABLET | ORAL | Status: AC | PRN

## 2011-08-01 MED ORDER — ROSUVASTATIN CALCIUM 20 MG PO TABS: 20.0000 mg | ORAL_TABLET | Freq: Every day | ORAL | Status: DC

## 2011-08-01 MED ORDER — TRAMADOL HCL 50 MG PO TABS: 50.0000 mg | ORAL_TABLET | Freq: Three times a day (TID) | ORAL | Status: AC | PRN

## 2011-08-01 MED ORDER — AMLODIPINE BESYLATE 10 MG PO TABS: 10.0000 mg | ORAL_TABLET | Freq: Every day | ORAL | Status: DC

## 2011-08-01 MED ORDER — ASPIRIN 325 MG PO TBEC: 325.0000 mg | DELAYED_RELEASE_TABLET | Freq: Every day | ORAL | Status: AC

## 2011-08-01 NOTE — Evaluation (Signed)
Physical Therapy Evaluation Patient Details Name: Sydney Phillips MRN: [id number] DOB: 1936-03-11 Today's Date: 08/01/2011  Problem List:  Patient Active Problem List  Diagnoses  . HYPERLIPIDEMIA, Hx of Niacin and statin intol  . DEPRESSION  . HTN, Nl renal arteries 3/12  . CAD, 75% LAD, 75% RCA 2009, low risk Myoview 2/12  . ACUTE SINUSITIS, UNSPECIFIED  . ALLERGIC RHINITIS  . COPD  . GERD  . CONSTIPATION  . HIP PAIN, BILATERAL  . SPINAL STENOSIS  . BACK PAIN, LUMBAR  . FIBROMYALGIA  . INSOMNIA  . WEIGHT GAIN  . DYSPNEA  . COLONIC POLYPS, HX OF  . Syncope  . Vertigo  . Hypokalemia  . Cardiomyopathy, mixed, EF responded to BiV, last EF 55% by 2D 2/12  . Biventricular ICD (implantable cardiac defibrillator) in place  . PVD, known severe, (previuosly asymptomatic) LSCA disease, now with "high grade" RICA disease.  . Bronchitis, recent flare    Past Medical History:  Past Medical History  Diagnosis Date  . COPD (chronic obstructive pulmonary disease)   . Hypertension   . GERD (gastroesophageal reflux disease)   . Depression   . Hx of colonic polyps   . Insomnia   . Fibromyalgia   . Hyperlipemia   . CAD (coronary artery disease)   . Vertigo    Past Surgical History:  Past Surgical History  Procedure Date  . Appendectomy   . Tonsillectomy   . Tubal ligation   . Cardiac defibrillator placement 05/2008    By Dr [name] HAVE MRI's    PT Assessment/Plan/Recommendation PT Assessment Clinical Impression Statement: pt admitted from [facility] after arriving due to passing out.  pt has undergone stenting to arteries deemed to need them.  Vertiginous symptom described at admission seem to be gone or moderated.  Pt. is at baseline function and does not need any further PT intervention . PT Recommendation/Assessment: Patent does not need any further PT services PT Recommendation Follow Up Recommendations: No PT follow up Equipment Recommended: None recommended by  PT PT Goals  Acute Rehab PT Goals PT Goal Formulation: With patient  PT Evaluation Precautions/Restrictions  Restrictions Weight Bearing Restrictions: No Prior Functioning  Home Living Lives With: Son (who works days) Actor Help From: Family Type of Home: House Home Layout: One level;Able to live on main level with bedroom/bathroom Home Adaptive Equipment: Straight cane;Walker - rolling;Walker - standard Prior Function Level of Independence: Independent with basic ADLs;Independent with gait;Independent with transfers;Independent with homemaking with ambulation Able to Take Stairs?: Yes Driving: Yes (but not often) Cognition Cognition Arousal/Alertness: Awake/alert Overall Cognitive Status: Appears within functional limits for tasks assessed Orientation Level: Oriented X4 Sensation/Coordination Coordination Gross Motor Movements are Fluid and Coordinated: Yes Fine Motor Movements are Fluid and Coordinated: Yes Extremity Assessment RUE Assessment RUE Assessment: Within Functional Limits LUE Assessment LUE Assessment: Within Functional Limits RLE Assessment RLE Assessment: Within Functional Limits LLE Assessment LLE Assessment: Within Functional Limits Mobility (including Balance) Bed Mobility Bed Mobility: No Transfers Transfers: Yes Sit to Stand: 7: Independent Stand to Sit: 7: Independent Ambulation/Gait Ambulation/Gait: Yes Ambulation/Gait Assistance: 7: Independent Ambulation Distance (Feet): 300 Feet Assistive device: None Gait Pattern: Within Functional Limits Gait velocity: 2.46-- would have minor difficulties in a community situation Stairs: No  Posture/Postural Control Posture/Postural Control: No significant limitations Balance Balance Assessed: Yes Dynamic Standing Balance Dynamic Standing - Balance Support: No upper extremity supported;During functional activity Dynamic Standing - Level of Assistance: 7: Independent High Level Balance High  Level Balance Activites:  Backward walking;Direction changes;Turns;Head turns High Level Balance Comments:  (no deviations noted, but pt is still mildly guarded.) Exercise    End of Session PT - End of Session Activity Tolerance: Patient tolerated treatment well Patient left: in chair;with call bell in reach Nurse Communication: Mobility status for ambulation;Mobility status for transfers General Behavior During Session: [facility] for tasks performed Cognition: [facility] for tasks performed  Maleik Vanderzee, Eliseo Gum 08/01/2011, 10:31 AM  08/01/2011  [place], PT [phone] (pager)

## 2011-08-01 NOTE — Discharge Instructions (Signed)
You are scheduled for upper extremity dopplers at Dr. Hazle Coca office 08/18/11 at 0700  Call [facility] if any bleeding, swelling or drainage at cath site.  May shower, no tub baths for 48 hours for groin sticks.  Driving and Equipment Restrictions Some medical problems make it dangerous to drive, ride a bike, or use machines. Some of these problems are:  A hard blow to the head (concussion).   Passing out (fainting).   Twitching and shaking (seizures).   Low blood sugar.   Taking medicine to help you relax (sedatives).   Taking pain medicines.   Wearing an eye patch.   Wearing splints. This can make it hard to use parts of your body that you need to drive safely.  HOME CARE   Do not drive until your doctor says it is okay.   Do not use machines until your doctor says it is okay.  You may need a form signed by your doctor (medical release) before you can drive again. You may also need this form before you do other tasks where you need to be fully alert. MAKE SURE YOU:  Understand these instructions.   Will watch your condition.   Will get help right away if you are not doing well or get worse.  Document Released: 07/10/2004 Document Revised: 02/12/2011 Document Reviewed: 10/10/2009 [facility] Patient Information 2012 [place], [place].

## 2011-08-01 NOTE — Progress Notes (Signed)
[facility]  DAILY PROGRESS NOTE   Subjective:  No events overnight. Feels well.  Objective:  Temp:  [97.9 F (36.6 C)-99.2 F (37.3 C)] 98.7 F (37.1 C) (02/15 0408) Pulse Rate:  [64-77] 77  (02/15 0000) Resp:  [15-21] 21  (02/15 0000) BP: (128-163)/(54-98) 136/58 mmHg (02/15 0400) SpO2:  [89 %-98 %] 95 % (02/15 0000) Weight change:   Intake/Output from previous day: 02/14 0701 - 02/15 0700 In: 1880.8 [P.O.:120; I.V.:1760.8] Out: 2350 [Urine:2350]  Intake/Output from this shift:    Medications: Current Facility-Administered Medications  Medication Dose Route Frequency Provider Last Rate Last Dose  . 0.9 %  sodium chloride infusion   Intravenous Continuous Runell Gess, MD 75 mL/hr at 07/31/11 2341    . acetaminophen (TYLENOL) tablet 650 mg  650 mg Oral Q4H PRN Runell Gess, MD      . ALPRAZolam Prudy Feeler) tablet 1 mg  1 mg Oral QID PRN Haydee [name], MD   1 mg at 07/31/11 2339  . amLODipine (NORVASC) tablet 10 mg  10 mg Oral Daily Zannie Cove, MD   10 mg at 07/31/11 0917  . aspirin EC tablet 325 mg  325 mg Oral Daily Runell Gess, MD      . clopidogrel (PLAVIX) tablet 75 mg  75 mg Oral Q breakfast Runell Gess, MD      . diphenhydrAMINE (BENADRYL) capsule 25 mg  25 mg Oral Q6H PRN Clint Lipps, MD   25 mg at 07/30/11 1252  . DULoxetine (CYMBALTA) DR capsule 60 mg  60 mg Oral BID Haydee [name], MD   60 mg at 07/31/11 2311  . furosemide (LASIX) tablet 40 mg  40 mg Oral Daily Zannie Cove, MD   40 mg at 07/31/11 0917  . heparin 1000 UNIT/ML injection           . heparin 2-0.9 UNIT/ML-% infusion           . HYDROcodone-acetaminophen (NORCO) 5-325 MG per tablet 1-2 tablet  1-2 tablet Oral Q6H PRN Clint Lipps, MD   2 tablet at 07/30/11 0458  . lidocaine (XYLOCAINE) 1 % injection           . meclizine (ANTIVERT) tablet 25 mg  25 mg Oral Q4H PRN Haydee [name], MD      . metoprolol tartrate (LOPRESSOR) tablet 25 mg  25 mg Oral  BID Haydee [name], MD   25 mg at 07/31/11 2337  . nitroGLYCERIN (NTG ON-CALL) 0.2 mg/mL injection           . ondansetron (ZOFRAN) injection 4 mg  4 mg Intravenous Q6H PRN Runell Gess, MD      . rosuvastatin (CRESTOR) tablet 20 mg  20 mg Oral QHS Leone Brand, NP   20 mg at 07/31/11 2337  . traMADol (ULTRAM) tablet 100 mg  100 mg Oral Q6H PRN Clint Lipps, MD   100 mg at 07/30/11 0140  . DISCONTD: 0.9 %  sodium chloride infusion  250 mL Intravenous PRN Leone Brand, NP      . DISCONTD: 0.9 %  sodium chloride infusion   Intravenous Continuous Abelino Derrick, PA 75 mL/hr at 07/31/11 1144    . DISCONTD: aspirin chewable tablet 324 mg  324 mg Oral Pre-Cath Leone Brand, NP      . DISCONTD: aspirin EC tablet 81 mg  81 mg Oral Daily Haydee [name], MD  81 mg at 07/31/11 0917  . DISCONTD: clopidogrel (PLAVIX) tablet 75 mg  75 mg Oral Daily Haydee [name], MD   75 mg at 07/31/11 0917  . DISCONTD: sodium chloride 0.9 % injection 3 mL  3 mL Intravenous Q12H Haydee [name], MD   3 mL at 07/30/11 0958  . DISCONTD: sodium chloride 0.9 % injection 3 mL  3 mL Intravenous Q12H Haydee [name], MD      . DISCONTD: sodium chloride 0.9 % injection 3 mL  3 mL Intravenous PRN Haydee [name], MD        Physical Exam: General appearance: alert and no distress Neck: no adenopathy, no carotid bruit, no JVD, supple, symmetrical, trachea midline and thyroid not enlarged, symmetric, no tenderness/mass/nodules Lungs: clear to auscultation bilaterally Heart: regular rate and rhythm Abdomen: soft, non-tender; bowel sounds normal; no masses,  no organomegaly Extremities: extremities normal, atraumatic, no cyanosis or edema  Lab Results: Results for orders placed during the hospital encounter of 07/27/11 (from the past 48 hour(s))  HEPATIC FUNCTION PANEL     Status: Abnormal   Collection Time   07/31/11  5:04 AM      Component Value Range Comment   Total Protein 6.3  6.0 - 8.3 (g/dL)    Albumin 3.2 (*) 3.5  - 5.2 (g/dL)    AST 14  0 - 37 (U/L)    ALT 11  0 - 35 (U/L)    Alkaline Phosphatase 103  39 - 117 (U/L)    Total Bilirubin 0.2 (*) 0.3 - 1.2 (mg/dL)    Bilirubin, Direct <9.6  0.0 - 0.3 (mg/dL)    Indirect Bilirubin NOT CALCULATED  0.3 - 0.9 (mg/dL)   BASIC METABOLIC PANEL     Status: Abnormal   Collection Time   07/31/11  5:04 AM      Component Value Range Comment   Sodium 140  135 - 145 (mEq/L)    Potassium 4.1  3.5 - 5.1 (mEq/L)    Chloride 104  96 - 112 (mEq/L)    CO2 27  19 - 32 (mEq/L)    Glucose, Bld 105 (*) 70 - 99 (mg/dL)    BUN 19  6 - 23 (mg/dL)    Creatinine, Ser 0.45  0.50 - 1.10 (mg/dL)    Calcium 9.2  8.4 - 10.5 (mg/dL)    GFR calc non Af Amer 55 (*) >90 (mL/min)    GFR calc Af Amer 63 (*) >90 (mL/min)   PROTIME-INR     Status: Normal   Collection Time   07/31/11  5:04 AM      Component Value Range Comment   Prothrombin Time 13.7  11.6 - 15.2 (seconds)    INR 1.03  0.00 - 1.49    CBC     Status: Normal   Collection Time   07/31/11  5:04 AM      Component Value Range Comment   WBC 10.2  4.0 - 10.5 (K/uL)    RBC 4.56  3.87 - 5.11 (MIL/uL)    Hemoglobin 12.7  12.0 - 15.0 (g/dL)    HCT 40.9  81.1 - 91.4 (%)    MCV 85.1  78.0 - 100.0 (fL)    MCH 27.9  26.0 - 34.0 (pg)    MCHC 32.7  30.0 - 36.0 (g/dL)    RDW 78.2  95.6 - 21.3 (%)    Platelets 251  150 - 400 (K/uL)   POCT ACTIVATED CLOTTING TIME  Status: Normal   Collection Time   07/31/11  4:29 PM      Component Value Range Comment   Activated Clotting Time 237     POCT ACTIVATED CLOTTING TIME     Status: Normal   Collection Time   07/31/11  5:03 PM      Component Value Range Comment   Activated Clotting Time 215     POCT ACTIVATED CLOTTING TIME     Status: Normal   Collection Time   07/31/11  5:35 PM      Component Value Range Comment   Activated Clotting Time 182     BASIC METABOLIC PANEL     Status: Abnormal   Collection Time   08/01/11  5:00 AM      Component Value Range Comment   Sodium 141  135 -  145 (mEq/L)    Potassium 4.0  3.5 - 5.1 (mEq/L)    Chloride 105  96 - 112 (mEq/L)    CO2 26  19 - 32 (mEq/L)    Glucose, Bld 90  70 - 99 (mg/dL)    BUN 14  6 - 23 (mg/dL)    Creatinine, Ser 1.61  0.50 - 1.10 (mg/dL)    Calcium 9.4  8.4 - 10.5 (mg/dL)    GFR calc non Af Amer 64 (*) >90 (mL/min)    GFR calc Af Amer 74 (*) >90 (mL/min)   CBC     Status: Normal   Collection Time   08/01/11  5:00 AM      Component Value Range Comment   WBC 10.2  4.0 - 10.5 (K/uL)    RBC 4.54  3.87 - 5.11 (MIL/uL)    Hemoglobin 12.6  12.0 - 15.0 (g/dL)    HCT 09.6  04.5 - 40.9 (%)    MCV 84.6  78.0 - 100.0 (fL)    MCH 27.8  26.0 - 34.0 (pg)    MCHC 32.8  30.0 - 36.0 (g/dL)    RDW 81.1  91.4 - 78.2 (%)    Platelets 271  150 - 400 (K/uL)     Imaging: No results found.  Assessment:  1. Principal Problem: 2.  *Syncope 3. Active Problems: 4.  HYPERLIPIDEMIA, Hx of Niacin and statin intol 5.  HTN, Nl renal arteries 3/12 6.  CAD, 75% LAD, 75% RCA 2009, low risk Myoview 2/12 7.  COPD 8.  Vertigo 9.  Hypokalemia 10.  Cardiomyopathy, mixed, EF responded to BiV, last EF 55% by 2D 2/12 11.  Biventricular ICD (implantable cardiac defibrillator) in place 12.  PVD, known severe, (previuosly asymptomatic) LSCA disease, now with "high grade" RICA disease. 13.  Bronchitis, recent flare 14.   Plan:  1. S/p successful subclavian artery stenting. Hemodynamically stable. Groin site ok without hematoma or bruit.  Probably okay for d/c home today. Will d/w Dr. Allyson Sabal.  Follow-up with him in the office.  Time Spent Directly with Patient:  15 minutes  Length of Stay:  LOS: 5 days   Chrystie Nose, MD, [facility] Attending Cardiologist [facility]  Tiyona Desouza C 08/01/2011, 7:53 AM

## 2011-08-01 NOTE — Progress Notes (Signed)
Pt smokes 1/2 ppd and says she is in the contemplation stage. She hasn't set a date yet but verbalizes understanding of risk factors of smoking. In the mean time referred to 1-800 quit now for f/u and support. Discussed oral fixation substitutes, second hand smoke and in home smoking policy. Reviewed and gave pt Written education/contact information.

## 2011-08-01 NOTE — Progress Notes (Signed)
Patient ready for discharge.  Instructions reviewed with patient by off going RN.  To door via wheelchair.  Home via POV with her son driving.

## 2011-08-01 NOTE — Discharge Summary (Signed)
Physician Discharge Summary  Patient ID: OCIE STANZIONE MRN: [id number] DOB/AGE: 02-07-1936 76 y.o.  Admit date: 07/27/2011 Discharge date: 08/01/2011  Primary Care Physician:  Nelwyn Salisbury, MD, MD   Discharge Diagnoses:   .Syncope (presumed to be secondary to vertebral circulation stenosis) .PVD, known severe, (previuosly asymptomatic) LSCA disease (s/p stent by Dr. Allyson Sabal) .CAD, 75% LAD, 75% RCA 2009, low risk Myoview 2/12 .Vertigo .Hypokalemia .HYPERLIPIDEMIA, Hx of Niacin and statin intol .HTN, Nl renal arteries 3/12 .COPD .Cardiomyopathy, mixed, EF responded to BiV, last EF 55% by 2D 2/12 .Biventricular ICD (implantable cardiac defibrillator) in place   Medication List  As of 08/01/2011 12:55 PM   STOP taking these medications         aspirin 81 MG tablet      meclizine 25 MG tablet      naproxen sodium 220 MG tablet         TAKE these medications         ALPRAZolam 1 MG tablet   Commonly known as: XANAX   Take 1 tablet (1 mg total) by mouth 4 (four) times daily as needed for anxiety.      amLODipine 10 MG tablet   Commonly known as: NORVASC   Take 1 tablet (10 mg total) by mouth daily.      aspirin 325 MG EC tablet   Take 1 tablet (325 mg total) by mouth daily.      clopidogrel 75 MG tablet   Commonly known as: PLAVIX   Take 75 mg by mouth daily.      DULoxetine 60 MG capsule   Commonly known as: CYMBALTA   Take 1 capsule (60 mg total) by mouth 2 (two) times daily.      furosemide 80 MG tablet   Commonly known as: LASIX   TAKE ONE TABLET BY MOUTH TWICE DAILY      Hydrocodone-Acetaminophen 10-660 MG Tabs   TAKE ONE TABLET BY MOUTH EVERY 6 HOURS AS NEEDED FOR PAIN      metoprolol tartrate 25 MG tablet   Commonly known as: LOPRESSOR   Take 25 mg by mouth 2 (two) times daily. Take 1/2 tablet twice a day      potassium chloride SA 20 MEQ tablet   Commonly known as: K-DUR,KLOR-CON   Take 20 mEq by mouth 2 (two) times daily.      rosuvastatin 20 MG  tablet   Commonly known as: CRESTOR   Take 1 tablet (20 mg total) by mouth at bedtime.      traMADol 50 MG tablet   Commonly known as: ULTRAM   Take 1 tablet (50 mg total) by mouth every 8 (eight) hours as needed for pain.             Disposition and Follow-up:  Patient discharge in stable condition and back to baseline level of function. Denies significant dizziness or lightheadedness sensation; she also denies CP, SOB or any other acute complaints. Patient will follow with Dr. Allyson Sabal in 15 days and with PCP in 2 approx. 2 weeks. She has been started on statins; ASA increased to 325mg  daily, amlodipine 10mg  daily and was instructed to follow a heart healthy diet/low fat diet.  Consults:  Cardiology   Significant Diagnostic Studies:  Ct Angio Head W/cm &/or Wo Cm  07/28/2011  *RADIOLOGY REPORT*  Clinical Data:  SYNCOPAL EPISODE WHILE WALKING UP TO TURN ON A LIGHT.  CT ANGIOGRAPHY HEAD AND NECK  Technique:  Multidetector CT imaging of the  head and neck was performed using the standard protocol during bolus administration of intravenous contrast.  Multiplanar CT image reconstructions including MIPs were obtained to evaluate the vascular anatomy. Carotid stenosis measurements (when applicable) are obtained utilizing NASCET criteria, using the distal internal carotid diameter as the denominator.  Contrast: OMNIPAQUE IOHEXOL 350 MG/ML IV SOLN  Comparison:  CT head without contrast 07/27/2011.  CTA NECK  Findings:  A standard three-vessel arch configuration is present.  Calcifications are present at the origin of the innominate and left common carotid artery.  There are dense calcifications at the origin of the left subclavian artery with near total loss of contrast volume.  A tandem stenosis of the left subclavian artery measures 70% relative to the distal vessel.  The left vertebral artery originates at the distal aspect of this stenosis next to a calcification.  There is a high-grade  stenosis of the proximal left vertebral artery.  The right vertebral artery origin is unremarkable.  The right vertebral artery is the dominant vessel.  The left vertebral artery enters the spinal canal at C6.  The right vertebral artery enters at C4. There is irregularity of the left vertebral artery at the level of the arch of C1.  The vessel and bifurcates into the PICA and the left vertebral artery.  The right common carotid artery demonstrates minimal calcification without significant stenosis.  There is near complete loss of contrast within the proximal right internal carotid artery, compatible with a high-grade stenosis.  The remainder of the right internal carotid artery is slightly smaller than the left.  The left common carotid artery exhibits scattered calcifications along its course.  There are no significant stenoses.  At the bifurcation, the vessel is narrowed to 2.0 mm.  Compared with the distal vessel of 4.2 mm, there is no significant stenosis.   Review of the MIP images confirms the above findings.  IMPRESSION:  1.  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  2.  Vessel irregularity in the distal left vertebral artery at the bifurcation to the PICA.  No focal injury is evident. 3.  High-grade stenosis of the proximal right internal carotid artery relative to the distal vessel. 4.  Approximately 50% stenosis of the left internal carotid artery at the bifurcation.  CTA HEAD  Findings:  The noncontrast CT head demonstrates scattered white matter hypoattenuation as before.  Atherosclerotic calcifications are present within the cavernous carotid arteries bilaterally without significant stenosis relative to the distal vessels.  The A1 and M1 segments are normal.  The anterior communicating artery is patent.  The MCA bifurcations are within normal limits.  There is segmental irregularity within the MCA branches bilaterally without a significant proximal stenosis or  occlusion.  The basilar artery is within normal limits.  Both posterior cerebral arteries originate from basilar tip.  There is irregularity within the PCA branches bilaterally without significant proximal stenosis or occlusion.  The dural sinuses fill normally.  The postcontrast images demonstrate no areas of pathologic enhancement.   Review of the MIP images confirms the above findings.  IMPRESSION:  1.  Atherosclerotic disease within the cavernous carotid arteries bilaterally without significant stenosis relative to the distal vessels. 2.  Moderate small vessel disease. 3.  No significant proximal stenosis, aneurysm, or branch vessel occlusion. 4.  Extensive white matter disease, likely related to a small vessel disease.  Original Report Authenticated By: Jamesetta Orleans. MATTERN, M.D.   Dg Shoulder Right  07/27/2011  *RADIOLOGY REPORT*  Clinical Data: Fall, right shoulder pain  RIGHT SHOULDER - 2+ VIEW  Comparison: None.  Findings: Three views of the right shoulder submitted.  No acute fracture or subluxation.  Mild spurring of the right humeral head.  Mild degenerative changes right AC joint.  IMPRESSION: No acute fracture or subluxation.  Degenerative changes as described above.  Original Report Authenticated By: Natasha Mead, M.D.   Ct Head Wo Contrast  07/27/2011  *RADIOLOGY REPORT*  Clinical Data:  Fall.  Loss of consciousness.  CT HEAD WITHOUT CONTRAST CT CERVICAL SPINE WITHOUT CONTRAST  Technique:  Multidetector CT imaging of the head and cervical spine was performed following the standard protocol without intravenous contrast.  Multiplanar CT image reconstructions of the cervical spine were also generated.  Comparison:  CT head 10/29/2009.  CT HEAD  Findings: Mild periventricular and subcortical white matter hypoattenuation bilaterally is stable.  No acute infarct, hemorrhage, or mass lesion is evident.  The ventricles are proportionate to the degree of atrophy.  No significant extra-axial fluid  collection is present.  High-density fluid is present within the left maxillary sinus.  No definite fracture is evident.  Fluid posteriorly in the right maxillary sinus is not as dense.  The remaining paranasal sinuses and mastoid air cells are clear.  The visualized osseous skull is intact.  Atherosclerotic calcifications are present in the cavernous carotid arteries bilaterally.  IMPRESSION:  1.  No acute intracranial abnormality or significant interval change. 2.  Stable white matter disease. 3.  High-density fluid within the left maxillary sinus.  Although this could be related to infection or formation, occult fracture is not excluded.  If the patient has tenderness over the left face, CT of the face may be useful for further evaluation.  CT CERVICAL SPINE  Findings: The cervical spine is imaged from skull base through the midbody of T1.  Chronic degenerative changes and sclerotic marrow changes are evident at C1-2.  Multilevel facet degenerative changes are evident bilaterally.  Uncovertebral disease is present on the left at C4-5 on the right at C5-6.  Atherosclerotic calcifications are present at the carotid bifurcations bilaterally.  The lung apices are clear.  IMPRESSION:  1.  Moderate spondylosis of the cervical spine, predominately at C4- 5 and C5-6. 2.  Moderate degenerative changes along the anterior arch of C1-2.  Original Report Authenticated By: Jamesetta Orleans. MATTERN, M.D.   Ct Angio Neck W/cm &/or Wo/cm  07/28/2011  *RADIOLOGY REPORT*  Clinical Data:  SYNCOPAL EPISODE WHILE WALKING UP TO TURN ON A LIGHT.  CT ANGIOGRAPHY HEAD AND NECK  Technique:  Multidetector CT imaging of the head and neck was performed using the standard protocol during bolus administration of intravenous contrast.  Multiplanar CT image reconstructions including MIPs were obtained to evaluate the vascular anatomy. Carotid stenosis measurements (when applicable) are obtained utilizing NASCET criteria, using the distal  internal carotid diameter as the denominator.  Contrast: OMNIPAQUE IOHEXOL 350 MG/ML IV SOLN  Comparison:  CT head without contrast 07/27/2011.  CTA NECK  Findings:  A standard three-vessel arch configuration is present.  Calcifications are present at the origin of the innominate and left common carotid artery.  There are dense calcifications at the origin of the left subclavian artery with near total loss of contrast volume.  A tandem stenosis of the left subclavian artery measures 70% relative to the distal vessel.  The left vertebral artery originates at the distal aspect of this stenosis next to a calcification.  There is a high-grade stenosis  of the proximal left vertebral artery.  The right vertebral artery origin is unremarkable.  The right vertebral artery is the dominant vessel.  The left vertebral artery enters the spinal canal at C6.  The right vertebral artery enters at C4. There is irregularity of the left vertebral artery at the level of the arch of C1.  The vessel and bifurcates into the PICA and the left vertebral artery.  The right common carotid artery demonstrates minimal calcification without significant stenosis.  There is near complete loss of contrast within the proximal right internal carotid artery, compatible with a high-grade stenosis.  The remainder of the right internal carotid artery is slightly smaller than the left.  The left common carotid artery exhibits scattered calcifications along its course.  There are no significant stenoses.  At the bifurcation, the vessel is narrowed to 2.0 mm.  Compared with the distal vessel of 4.2 mm, there is no significant stenosis.   Review of the MIP images confirms the above findings.  IMPRESSION:  1.  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  2.  Vessel irregularity in the distal left vertebral artery at the bifurcation to the PICA.  No focal injury is evident. 3.  High-grade stenosis of the proximal right  internal carotid artery relative to the distal vessel. 4.  Approximately 50% stenosis of the left internal carotid artery at the bifurcation.  CTA HEAD  Findings:  The noncontrast CT head demonstrates scattered white matter hypoattenuation as before.  Atherosclerotic calcifications are present within the cavernous carotid arteries bilaterally without significant stenosis relative to the distal vessels.  The A1 and M1 segments are normal.  The anterior communicating artery is patent.  The MCA bifurcations are within normal limits.  There is segmental irregularity within the MCA branches bilaterally without a significant proximal stenosis or occlusion.  The basilar artery is within normal limits.  Both posterior cerebral arteries originate from basilar tip.  There is irregularity within the PCA branches bilaterally without significant proximal stenosis or occlusion.  The dural sinuses fill normally.  The postcontrast images demonstrate no areas of pathologic enhancement.   Review of the MIP images confirms the above findings.  IMPRESSION:  1.  Atherosclerotic disease within the cavernous carotid arteries bilaterally without significant stenosis relative to the distal vessels. 2.  Moderate small vessel disease. 3.  No significant proximal stenosis, aneurysm, or branch vessel occlusion. 4.  Extensive white matter disease, likely related to a small vessel disease.  Original Report Authenticated By: Jamesetta Orleans. MATTERN, M.D.   Ct Cervical Spine Wo Contrast  07/27/2011  *RADIOLOGY REPORT*  Clinical Data:  Fall.  Loss of consciousness.  CT HEAD WITHOUT CONTRAST CT CERVICAL SPINE WITHOUT CONTRAST  Technique:  Multidetector CT imaging of the head and cervical spine was performed following the standard protocol without intravenous contrast.  Multiplanar CT image reconstructions of the cervical spine were also generated.  Comparison:  CT head 10/29/2009.  CT HEAD  Findings: Mild periventricular and subcortical white  matter hypoattenuation bilaterally is stable.  No acute infarct, hemorrhage, or mass lesion is evident.  The ventricles are proportionate to the degree of atrophy.  No significant extra-axial fluid collection is present.  High-density fluid is present within the left maxillary sinus.  No definite fracture is evident.  Fluid posteriorly in the right maxillary sinus is not as dense.  The remaining paranasal sinuses and mastoid air cells are clear.  The visualized osseous skull is intact.  Atherosclerotic calcifications  are present in the cavernous carotid arteries bilaterally.  IMPRESSION:  1.  No acute intracranial abnormality or significant interval change. 2.  Stable white matter disease. 3.  High-density fluid within the left maxillary sinus.  Although this could be related to infection or formation, occult fracture is not excluded.  If the patient has tenderness over the left face, CT of the face may be useful for further evaluation.  CT CERVICAL SPINE  Findings: The cervical spine is imaged from skull base through the midbody of T1.  Chronic degenerative changes and sclerotic marrow changes are evident at C1-2.  Multilevel facet degenerative changes are evident bilaterally.  Uncovertebral disease is present on the left at C4-5 on the right at C5-6.  Atherosclerotic calcifications are present at the carotid bifurcations bilaterally.  The lung apices are clear.  IMPRESSION:  1.  Moderate spondylosis of the cervical spine, predominately at C4- 5 and C5-6. 2.  Moderate degenerative changes along the anterior arch of C1-2.  Original Report Authenticated By: Jamesetta Orleans. MATTERN, M.D.    Brief H and P: 76 year old female with a history of coronary artery disease status post pacemaker and AICD placement who has been experiencing some sinusitis/bronchitis over the last month or so. Since she's been treated for his nasal congestion she's been experiencing benign positional vertigo over the last several weeks. This  is been getting better with Antivert. This is been prescribed by her primary care physician. However today she went to go in turn off the ceiling fan raised her arm up at which point she passed out. She has no idea how long she was out for. She's been in her normal state of health denies any nausea, vomiting, diarrhea, fevers, chest pain, shortness of breath, abdominal pain. When she came to she did not have any urinary or bowel incontinence. She denies any previous history of syncopal episodes. She did not experience any vertigo surrounding the syncopal episode. She does get her pacemaker interrogated monthly and just had this done last week and which she reports no abnormalities.   Hospital Course:  1. Syncope/Dizziness : S/P L subclavian stent by Dr. Allyson Sabal; no further dizziness symptoms. Will continue plavix; ASA (now 325 daily) and also started on statins. Dr. Allyson Sabal will follow her in 15 days.  2. Cardiomyopathy-mixed: s/p Biventricular ICD placement; stable most recent EF 53%. No symptoms or signs of decompensation or fluid overload. Will resume Lasix by mouth at home dose and will continue the rest of her medication regimen.  3. Hypertension: better controlled today, continue beta blocker, amlodipine and Lasix   4. COPD stable: continue current therapy.  5. HLD: started on crestor.   The rest of her medical problems remains stable and plan is to continue same medication regimen and follow up with PCP for further outpatient adjustments.   Time spent on Discharge: 45 minutes  Signed: Malaika Arnall 08/01/2011, 12:55 PM

## 2011-08-08 ENCOUNTER — Emergency Department (HOSPITAL_BASED_OUTPATIENT_CLINIC_OR_DEPARTMENT_OTHER)
Admission: EM | Admit: 2011-08-08 | Discharge: 2011-08-08 | Disposition: A | Discharge status: Home / Self Care | Payer: Medicare Other | Attending: Emergency Medicine | Admitting: Emergency Medicine

## 2011-08-08 ENCOUNTER — Emergency Department (INDEPENDENT_AMBULATORY_CARE_PROVIDER_SITE_OTHER): Payer: Medicare Other

## 2011-08-08 ENCOUNTER — Encounter (HOSPITAL_BASED_OUTPATIENT_CLINIC_OR_DEPARTMENT_OTHER): Payer: Self-pay | Admitting: *Deleted

## 2011-08-08 ENCOUNTER — Other Ambulatory Visit: Payer: Self-pay

## 2011-08-08 DIAGNOSIS — I517 Cardiomegaly: Secondary | ICD-10-CM

## 2011-08-08 DIAGNOSIS — E785 Hyperlipidemia, unspecified: Secondary | ICD-10-CM | POA: Insufficient documentation

## 2011-08-08 DIAGNOSIS — F3289 Other specified depressive episodes: Secondary | ICD-10-CM | POA: Insufficient documentation

## 2011-08-08 DIAGNOSIS — Z9889 Other specified postprocedural states: Secondary | ICD-10-CM | POA: Insufficient documentation

## 2011-08-08 DIAGNOSIS — W010XXA Fall on same level from slipping, tripping and stumbling without subsequent striking against object, initial encounter: Secondary | ICD-10-CM | POA: Insufficient documentation

## 2011-08-08 DIAGNOSIS — Z043 Encounter for examination and observation following other accident: Secondary | ICD-10-CM

## 2011-08-08 DIAGNOSIS — Z950 Presence of cardiac pacemaker: Secondary | ICD-10-CM

## 2011-08-08 DIAGNOSIS — K219 Gastro-esophageal reflux disease without esophagitis: Secondary | ICD-10-CM | POA: Insufficient documentation

## 2011-08-08 DIAGNOSIS — Z95810 Presence of automatic (implantable) cardiac defibrillator: Secondary | ICD-10-CM | POA: Insufficient documentation

## 2011-08-08 DIAGNOSIS — I10 Essential (primary) hypertension: Secondary | ICD-10-CM | POA: Insufficient documentation

## 2011-08-08 DIAGNOSIS — W19XXXA Unspecified fall, initial encounter: Secondary | ICD-10-CM

## 2011-08-08 DIAGNOSIS — R42 Dizziness and giddiness: Secondary | ICD-10-CM | POA: Insufficient documentation

## 2011-08-08 DIAGNOSIS — Z9861 Coronary angioplasty status: Secondary | ICD-10-CM | POA: Insufficient documentation

## 2011-08-08 DIAGNOSIS — I771 Stricture of artery: Secondary | ICD-10-CM | POA: Insufficient documentation

## 2011-08-08 DIAGNOSIS — I2510 Atherosclerotic heart disease of native coronary artery without angina pectoris: Secondary | ICD-10-CM | POA: Insufficient documentation

## 2011-08-08 DIAGNOSIS — J449 Chronic obstructive pulmonary disease, unspecified: Secondary | ICD-10-CM | POA: Insufficient documentation

## 2011-08-08 DIAGNOSIS — F17200 Nicotine dependence, unspecified, uncomplicated: Secondary | ICD-10-CM | POA: Insufficient documentation

## 2011-08-08 DIAGNOSIS — S52023A Displaced fracture of olecranon process without intraarticular extension of unspecified ulna, initial encounter for closed fracture: Secondary | ICD-10-CM | POA: Insufficient documentation

## 2011-08-08 DIAGNOSIS — M25529 Pain in unspecified elbow: Secondary | ICD-10-CM | POA: Insufficient documentation

## 2011-08-08 DIAGNOSIS — J9819 Other pulmonary collapse: Secondary | ICD-10-CM

## 2011-08-08 DIAGNOSIS — J4489 Other specified chronic obstructive pulmonary disease: Secondary | ICD-10-CM | POA: Insufficient documentation

## 2011-08-08 DIAGNOSIS — IMO0001 Reserved for inherently not codable concepts without codable children: Secondary | ICD-10-CM | POA: Insufficient documentation

## 2011-08-08 DIAGNOSIS — Y92009 Unspecified place in unspecified non-institutional (private) residence as the place of occurrence of the external cause: Secondary | ICD-10-CM | POA: Insufficient documentation

## 2011-08-08 DIAGNOSIS — F329 Major depressive disorder, single episode, unspecified: Secondary | ICD-10-CM | POA: Insufficient documentation

## 2011-08-08 DIAGNOSIS — S5000XA Contusion of unspecified elbow, initial encounter: Secondary | ICD-10-CM

## 2011-08-08 DIAGNOSIS — Z9851 Tubal ligation status: Secondary | ICD-10-CM | POA: Insufficient documentation

## 2011-08-08 LAB — CBC
Platelets: 334 10*3/uL (ref 150–400)
RDW: 15.5 % (ref 11.5–15.5)
WBC: 12.9 10*3/uL — ABNORMAL HIGH (ref 4.0–10.5)

## 2011-08-08 LAB — DIFFERENTIAL
Basophils Absolute: 0 10*3/uL (ref 0.0–0.1)
Eosinophils Relative: 3 % (ref 0–5)
Lymphocytes Relative: 12 % (ref 12–46)
Neutro Abs: 9.7 10*3/uL — ABNORMAL HIGH (ref 1.7–7.7)
Neutrophils Relative %: 76 % (ref 43–77)

## 2011-08-08 LAB — BASIC METABOLIC PANEL
CO2: 28 mEq/L (ref 19–32)
Calcium: 9.5 mg/dL (ref 8.4–10.5)
Chloride: 101 mEq/L (ref 96–112)
Potassium: 3.1 mEq/L — ABNORMAL LOW (ref 3.5–5.1)
Sodium: 141 mEq/L (ref 135–145)

## 2011-08-08 MED ORDER — ONDANSETRON HCL 4 MG/2ML IJ SOLN
4.0000 mg | Freq: Once | INTRAMUSCULAR | Status: AC
  Administered 2011-08-08: 4 mg via INTRAVENOUS

## 2011-08-08 MED ORDER — ONDANSETRON HCL 4 MG/2ML IJ SOLN
INTRAMUSCULAR | Status: AC
  Administered 2011-08-08: 4 mg via INTRAVENOUS
  Filled 2011-08-08: qty 2

## 2011-08-08 MED ORDER — HYDROMORPHONE HCL PF 1 MG/ML IJ SOLN
1.0000 mg | Freq: Once | INTRAMUSCULAR | Status: AC
  Administered 2011-08-08: 1 mg via INTRAVENOUS

## 2011-08-08 MED ORDER — HYDROMORPHONE HCL PF 1 MG/ML IJ SOLN
INTRAMUSCULAR | Status: AC
  Administered 2011-08-08: 1 mg via INTRAVENOUS
  Filled 2011-08-08: qty 1

## 2011-08-08 MED ORDER — OXYCODONE-ACETAMINOPHEN 5-325 MG PO TABS: 2.0000 | ORAL_TABLET | ORAL | Status: DC | PRN

## 2011-08-08 NOTE — ED Provider Notes (Signed)
History     CSN: [id number]  Arrival date & time 08/08/11  1200   First MD Initiated Contact with Patient 08/08/11 1207      Chief Complaint  Patient presents with  . Fall  . Joint Swelling    (Consider location/radiation/quality/duration/timing/severity/associated sxs/prior treatment) Patient is a 76 y.o. female presenting with fall. The history is provided by the patient. No language interpreter was used.  Fall The accident occurred yesterday. The fall occurred while walking. She fell from a height of 6 to 10 ft. She landed on a hard floor. There was no blood loss. The point of impact was the right elbow. The pain is present in the right elbow. The pain is at a severity of 7/10. The pain is moderate. She was not ambulatory at the scene. The symptoms are aggravated by activity. She has tried nothing for the symptoms. The treatment provided no relief.  Pt reports she awoke from sleep and needed to go to the bathroom.  Pt reports she lost control of urine.  Pt reports she slipped on urine in the bathroom floor and fell hitting left elbow.   Pt denies any other areas of injury.  No loss of conciousness.  Pt denies any head injury  Past Medical History  Diagnosis Date  . COPD (chronic obstructive pulmonary disease)   . Hypertension   . GERD (gastroesophageal reflux disease)   . Depression   . Hx of colonic polyps   . Insomnia   . Fibromyalgia   . Hyperlipemia   . CAD (coronary artery disease)   . Vertigo   . Subclavian arterial stenosis, lt, with PTA/STENT 07/31/11 08/01/2011  . S/P angioplasty with stent, lt. subclavian 07/31/11 08/01/2011    Past Surgical History  Procedure Date  . Appendectomy   . Tonsillectomy   . Tubal ligation   . Cardiac defibrillator placement 05/2008    By Dr [name] HAVE MRI's    Family History  Problem Relation Age of Onset  . Colon cancer    . Hyperlipidemia      History  Substance Use Topics  . Smoking status: Current Everyday Smoker  -- 0.5 packs/day    Types: Cigarettes  . Smokeless tobacco: Never Used   Comment: less than 1/2 pack a day  . Alcohol Use: No    OB History    Grav Para Term Preterm Abortions TAB SAB Ect Mult Living                  Review of Systems  Musculoskeletal: Positive for myalgias and joint swelling.  All other systems reviewed and are negative.    Allergies  Codeine; Darvon; Meloxicam; Propoxyphene n-acetaminophen; Rofecoxib; Azithromycin; Erythromycin; Penicillins; and Sulfa antibiotics  Home Medications   Current Outpatient Rx  Name Route Sig Dispense Refill  . ALPRAZOLAM 1 MG PO TABS Oral Take 1 tablet (1 mg total) by mouth 4 (four) times daily as needed for anxiety. 120 tablet 5  . AMLODIPINE BESYLATE 10 MG PO TABS Oral Take 1 tablet (10 mg total) by mouth daily. 30 tablet 1  . ASPIRIN 325 MG PO TBEC Oral Take 1 tablet (325 mg total) by mouth daily. 30 tablet   . CLOPIDOGREL BISULFATE 75 MG PO TABS Oral Take 75 mg by mouth daily.      . DULOXETINE HCL 60 MG PO CPEP Oral Take 1 capsule (60 mg total) by mouth 2 (two) times daily. 60 capsule 11  . FUROSEMIDE 80 MG PO TABS  TAKE ONE TABLET BY MOUTH TWICE DAILY 60 tablet 9  . HYDROCODONE-ACETAMINOPHEN 10-660 MG PO TABS  TAKE ONE TABLET BY MOUTH EVERY 6 HOURS AS NEEDED FOR PAIN 120 each 5  . MECLIZINE HCL 25 MG PO TABS Oral Take 1 tablet (25 mg total) by mouth every 4 (four) hours as needed for dizziness. 30 tablet   . METOPROLOL TARTRATE 25 MG PO TABS Oral Take 25 mg by mouth 2 (two) times daily. Take 1/2 tablet twice a day    . POTASSIUM CHLORIDE CRYS ER 20 MEQ PO TBCR Oral Take 20 mEq by mouth 2 (two) times daily.    Marland Kitchen ROSUVASTATIN CALCIUM 20 MG PO TABS Oral Take 1 tablet (20 mg total) by mouth at bedtime. 30 tablet 1  . TRAMADOL HCL 50 MG PO TABS Oral Take 1 tablet (50 mg total) by mouth every 8 (eight) hours as needed for pain. 45 tablet 0    BP 159/72  Pulse 96  Temp(Src) 97.7 F (36.5 C) (Oral)  Resp 20  SpO2  94%  Physical Exam  Vitals reviewed. Constitutional: She is oriented to person, place, and time. She appears well-developed and well-nourished.  HENT:  Head: Normocephalic and atraumatic.  Right Ear: External ear normal.  Left Ear: External ear normal.  Nose: Nose normal.  Mouth/Throat: Oropharynx is clear and moist.  Eyes: Conjunctivae and EOM are normal. Pupils are equal, round, and reactive to light.  Neck: Normal range of motion.  Abdominal: Soft.  Musculoskeletal: She exhibits edema and tenderness.  Neurological: She is alert and oriented to person, place, and time. She has normal reflexes.  Skin: Skin is warm.  Psychiatric: She has a normal mood and affect.    ED Course  Procedures (including critical care time)  Labs Reviewed  CBC - Abnormal; Notable for the following:    WBC 12.9 (*)    All other components within normal limits  DIFFERENTIAL - Abnormal; Notable for the following:    Neutro Abs 9.7 (*)    Monocytes Absolute 1.2 (*)    All other components within normal limits  URINALYSIS, ROUTINE W REFLEX MICROSCOPIC   No results found.   No diagnosis found.    MDM   Dr. Anitra Lauth in to examine.  Dr. Anitra Lauth spoke to Dr. Mina Marble who advised posterior splint and sling and he will see pt in office on Tuesday for evalaution for surgery.   Pt given Rx for percocet      [place], [place] 08/08/11 [phone]

## 2011-08-08 NOTE — ED Notes (Signed)
Had to page Dr. Mina Marble via his nurse

## 2011-08-08 NOTE — ED Notes (Signed)
Pt to room 5 by ems via stretcher. Pt able to stand and walk from ems gurney to stretcher. Pt reports slip and fall on urine in her bathroom just pta. Pt states "I leaked a little on the floor and I slipped in it..." pt denies any feeling dizzy or chest pain.  Pt states she hit her head on the floor, but denies any loc. Cc is left elbow pain, hematoma noted to left elbow. Pt guarding, refuses to allow this rn to cut her shirt off "my grandson loves this shirt on me..." pt is able to move arm to remove shirt, has rom to elbow, shoulder and wrist. + radial pulse, good cap refill.

## 2011-08-08 NOTE — Discharge Instructions (Signed)
Elbow Fracture, Simple     A fracture is a break in one of the bones.When fractures are not displaced or separated, they may be treated with only a sling or splint. The sling or splint may only be required for two to three weeks. In these cases, often the elbow is put through early range of motion exercises to prevent the elbow from getting stiff.  DIAGNOSIS   The diagnosis (learning what is wrong) of a fractured elbow is made by x-ray. These may be required before and after the elbow is put into a splint or cast. X-rays are taken after to make sure the bone pieces have not moved.  HOME CARE INSTRUCTIONS   · Only take over-the-counter or prescription medicines for pain, discomfort, or fever as directed by your caregiver.   · If you have a splint held on with an elastic wrap and your hand or fingers become numb or cold and blue, loosen the wrap and reapply more loosely. See your caregiver if there is no relief.   · You may use ice for twenty minutes, four times per day, for the first two to three days.   · Use your elbow as directed.   · See your caregiver as directed. It is very important to keep all follow-up referrals and appointments in order to avoid any long-term problems with your elbow including chronic pain or stiffness.   SEEK IMMEDIATE MEDICAL CARE IF:   · There is swelling or increasing pain in elbow.   · You begin to lose feeling or experience numbness or tingling in your hand or fingers.   · You develop swelling of the hand and fingers.   · You get a cold or blue hand or fingers on affected side.   MAKE SURE YOU:   · Understand these instructions.   · Will watch your condition.   · Will get help right away if you are not doing well or get worse.   Document Released: 05/27/2001 Document Revised: 02/12/2011 Document Reviewed: 04/17/2009  [employer]® Patient Information ©2012 [employer].

## 2011-08-11 NOTE — ED Provider Notes (Signed)
Medical screening examination/treatment/procedure(s) were conducted as a shared visit with non-physician practitioner(s) and myself.  I personally evaluated the patient during the encounter Patient with mechanical fall and obvious deformity of the left elbow concerning for fracture. Plain films with a displaced olecranon fracture. Spoke with hand surgery and patient will be splinted and followed up on Tuesday in clinic. Patient was neurovascularly intact in injury is closed.  Gwyneth Sprout, MD 08/11/11 [phone]

## 2011-08-13 ENCOUNTER — Other Ambulatory Visit: Payer: Self-pay | Admitting: Orthopedic Surgery

## 2011-08-13 ENCOUNTER — Encounter (HOSPITAL_COMMUNITY): Payer: Self-pay | Admitting: Pharmacy Technician

## 2011-08-15 ENCOUNTER — Encounter (HOSPITAL_COMMUNITY): Payer: Self-pay

## 2011-08-15 ENCOUNTER — Encounter (HOSPITAL_COMMUNITY)
Admission: RE | Admit: 2011-08-15 | Discharge: 2011-08-15 | Disposition: A | Discharge status: Home / Self Care | Payer: Medicare Other | Source: Ambulatory Visit | Attending: Orthopedic Surgery | Admitting: Orthopedic Surgery

## 2011-08-15 HISTORY — DX: Anxiety disorder, unspecified: F41.9

## 2011-08-15 HISTORY — DX: Unspecified cataract: H26.9

## 2011-08-15 HISTORY — DX: Dorsalgia, unspecified: M54.9

## 2011-08-15 HISTORY — DX: Other chronic pain: G89.29

## 2011-08-15 HISTORY — DX: Other constipation: K59.09

## 2011-08-15 HISTORY — DX: Bronchitis, not specified as acute or chronic: J40

## 2011-08-15 HISTORY — DX: Unspecified osteoarthritis, unspecified site: M19.90

## 2011-08-15 MED ORDER — CHLORHEXIDINE GLUCONATE 4 % EX LIQD: 60.0000 mL | Freq: Once | CUTANEOUS | Status: DC

## 2011-08-15 NOTE — Progress Notes (Signed)
Echo in epic from 07/28/11 Stress test 09/03/10  Heart cath 2013

## 2011-08-15 NOTE — Pre-Procedure Instructions (Signed)
20 DESERI LOSS  08/15/2011   Your procedure is scheduled on:  Sat, Mar 2 @ 0730  Report to [facility] at 0600 AM.  Call this number if you have problems the morning of surgery: [phone]   Remember:   Do not eat food:After Midnight.  May have clear liquids: up to 4 Hours before arrival.(until 2:00 am)  Clear liquids include soda, tea, black coffee, apple or grape juice, broth.  Take these medicines the morning of surgery with A SIP OF WATER: Xanax,Amlodipine,Cymbalta,Pain Pill(if needed),Meclizine(if needed),Metoprolol   Do not wear jewelry, make-up or nail polish.  Do not wear lotions, powders, or perfumes. You may wear deodorant.  Do not shave 48 hours prior to surgery.  Do not bring valuables to the hospital.  Contacts, dentures or bridgework may not be worn into surgery.  Leave suitcase in the car. After surgery it may be brought to your room.  For patients admitted to the hospital, checkout time is 11:00 AM the day of discharge.   Patients discharged the day of surgery will not be allowed to drive home.  Name and phone number of your driver:   Special Instructions: CHG Shower Use Special Wash: 1/2 bottle night before surgery and 1/2 bottle morning of surgery.   Please read over the following fact sheets that you were given: Pain Booklet, Coughing and Deep Breathing, MRSA Information and Surgical Site Infection Prevention

## 2011-08-16 ENCOUNTER — Ambulatory Visit (HOSPITAL_COMMUNITY)
Admission: RE | Admit: 2011-08-16 | Discharge: 2011-08-16 | Disposition: A | Discharge status: Home / Self Care | Payer: Medicare Other | Source: Ambulatory Visit | Attending: Orthopedic Surgery | Admitting: Orthopedic Surgery

## 2011-08-16 ENCOUNTER — Encounter (HOSPITAL_COMMUNITY): Payer: Self-pay | Admitting: *Deleted

## 2011-08-16 ENCOUNTER — Encounter (HOSPITAL_COMMUNITY): Payer: Self-pay | Admitting: Anesthesiology

## 2011-08-16 ENCOUNTER — Ambulatory Visit (HOSPITAL_COMMUNITY): Payer: Medicare Other | Admitting: Anesthesiology

## 2011-08-16 ENCOUNTER — Encounter (HOSPITAL_COMMUNITY)
Admission: RE | Disposition: A | Discharge status: Home / Self Care | Payer: Self-pay | Source: Ambulatory Visit | Attending: Orthopedic Surgery

## 2011-08-16 DIAGNOSIS — Z860100 Personal history of colon polyps, unspecified: Secondary | ICD-10-CM | POA: Insufficient documentation

## 2011-08-16 DIAGNOSIS — K219 Gastro-esophageal reflux disease without esophagitis: Secondary | ICD-10-CM | POA: Insufficient documentation

## 2011-08-16 DIAGNOSIS — I10 Essential (primary) hypertension: Secondary | ICD-10-CM | POA: Insufficient documentation

## 2011-08-16 DIAGNOSIS — J4489 Other specified chronic obstructive pulmonary disease: Secondary | ICD-10-CM | POA: Insufficient documentation

## 2011-08-16 DIAGNOSIS — Z01812 Encounter for preprocedural laboratory examination: Secondary | ICD-10-CM | POA: Insufficient documentation

## 2011-08-16 DIAGNOSIS — M129 Arthropathy, unspecified: Secondary | ICD-10-CM | POA: Insufficient documentation

## 2011-08-16 DIAGNOSIS — H269 Unspecified cataract: Secondary | ICD-10-CM | POA: Insufficient documentation

## 2011-08-16 DIAGNOSIS — I2510 Atherosclerotic heart disease of native coronary artery without angina pectoris: Secondary | ICD-10-CM | POA: Insufficient documentation

## 2011-08-16 DIAGNOSIS — Z79899 Other long term (current) drug therapy: Secondary | ICD-10-CM | POA: Insufficient documentation

## 2011-08-16 DIAGNOSIS — M549 Dorsalgia, unspecified: Secondary | ICD-10-CM | POA: Insufficient documentation

## 2011-08-16 DIAGNOSIS — W19XXXA Unspecified fall, initial encounter: Secondary | ICD-10-CM | POA: Insufficient documentation

## 2011-08-16 DIAGNOSIS — IMO0001 Reserved for inherently not codable concepts without codable children: Secondary | ICD-10-CM | POA: Insufficient documentation

## 2011-08-16 DIAGNOSIS — S52035A Nondisplaced fracture of olecranon process with intraarticular extension of left ulna, initial encounter for closed fracture: Secondary | ICD-10-CM

## 2011-08-16 DIAGNOSIS — S52023A Displaced fracture of olecranon process without intraarticular extension of unspecified ulna, initial encounter for closed fracture: Secondary | ICD-10-CM | POA: Insufficient documentation

## 2011-08-16 DIAGNOSIS — Z7902 Long term (current) use of antithrombotics/antiplatelets: Secondary | ICD-10-CM | POA: Insufficient documentation

## 2011-08-16 DIAGNOSIS — F3289 Other specified depressive episodes: Secondary | ICD-10-CM | POA: Insufficient documentation

## 2011-08-16 DIAGNOSIS — I739 Peripheral vascular disease, unspecified: Secondary | ICD-10-CM | POA: Insufficient documentation

## 2011-08-16 DIAGNOSIS — E785 Hyperlipidemia, unspecified: Secondary | ICD-10-CM | POA: Insufficient documentation

## 2011-08-16 DIAGNOSIS — J449 Chronic obstructive pulmonary disease, unspecified: Secondary | ICD-10-CM | POA: Insufficient documentation

## 2011-08-16 DIAGNOSIS — F411 Generalized anxiety disorder: Secondary | ICD-10-CM | POA: Insufficient documentation

## 2011-08-16 DIAGNOSIS — F329 Major depressive disorder, single episode, unspecified: Secondary | ICD-10-CM | POA: Insufficient documentation

## 2011-08-16 DIAGNOSIS — G4700 Insomnia, unspecified: Secondary | ICD-10-CM | POA: Insufficient documentation

## 2011-08-16 DIAGNOSIS — F17200 Nicotine dependence, unspecified, uncomplicated: Secondary | ICD-10-CM | POA: Insufficient documentation

## 2011-08-16 DIAGNOSIS — R42 Dizziness and giddiness: Secondary | ICD-10-CM | POA: Insufficient documentation

## 2011-08-16 DIAGNOSIS — G8929 Other chronic pain: Secondary | ICD-10-CM | POA: Insufficient documentation

## 2011-08-16 HISTORY — PX: ORIF ELBOW FRACTURE: SHX5031

## 2011-08-16 SURGERY — OPEN REDUCTION INTERNAL FIXATION (ORIF) ELBOW/OLECRANON FRACTURE
Anesthesia: General | Site: Elbow | Laterality: Left | Wound class: Clean

## 2011-08-16 MED ORDER — MEPERIDINE HCL 25 MG/ML IJ SOLN: 6.2500 mg | INTRAMUSCULAR | Status: DC | PRN

## 2011-08-16 MED ORDER — PROPOFOL 10 MG/ML IV EMUL
INTRAVENOUS | Status: DC | PRN
  Administered 2011-08-16: 100 mg via INTRAVENOUS

## 2011-08-16 MED ORDER — 0.9 % SODIUM CHLORIDE (POUR BTL) OPTIME
TOPICAL | Status: DC | PRN
  Administered 2011-08-16: 1000 mL

## 2011-08-16 MED ORDER — LACTATED RINGERS IV SOLN
INTRAVENOUS | Status: DC | PRN
  Administered 2011-08-16: 07:00:00 via INTRAVENOUS

## 2011-08-16 MED ORDER — NEOSTIGMINE METHYLSULFATE 1 MG/ML IJ SOLN
INTRAMUSCULAR | Status: DC | PRN
  Administered 2011-08-16: 4 mg via INTRAVENOUS

## 2011-08-16 MED ORDER — MIDAZOLAM HCL 5 MG/5ML IJ SOLN
INTRAMUSCULAR | Status: DC | PRN
  Administered 2011-08-16: 1 mg via INTRAVENOUS

## 2011-08-16 MED ORDER — ROCURONIUM BROMIDE 100 MG/10ML IV SOLN
INTRAVENOUS | Status: DC | PRN
  Administered 2011-08-16: 40 mg via INTRAVENOUS

## 2011-08-16 MED ORDER — VANCOMYCIN HCL 1000 MG IV SOLR
1000.0000 mg | INTRAVENOUS | Status: DC | PRN
  Administered 2011-08-16: 1000 mg via INTRAVENOUS

## 2011-08-16 MED ORDER — GLYCOPYRROLATE 0.2 MG/ML IJ SOLN
INTRAMUSCULAR | Status: DC | PRN
  Administered 2011-08-16: .6 mg via INTRAVENOUS

## 2011-08-16 MED ORDER — BUPIVACAINE-EPINEPHRINE PF 0.5-1:200000 % IJ SOLN
INTRAMUSCULAR | Status: DC | PRN
  Administered 2011-08-16: 35 mL

## 2011-08-16 MED ORDER — HYDROMORPHONE HCL PF 1 MG/ML IJ SOLN: 0.2500 mg | INTRAMUSCULAR | Status: DC | PRN

## 2011-08-16 MED ORDER — FENTANYL CITRATE 0.05 MG/ML IJ SOLN
INTRAMUSCULAR | Status: DC | PRN
  Administered 2011-08-16: 50 ug via INTRAVENOUS
  Administered 2011-08-16: 100 ug via INTRAVENOUS

## 2011-08-16 MED ORDER — HYDROMORPHONE HCL 2 MG PO TABS: 2.0000 mg | ORAL_TABLET | ORAL | Status: AC | PRN

## 2011-08-16 MED ORDER — ONDANSETRON HCL 4 MG/2ML IJ SOLN
INTRAMUSCULAR | Status: DC | PRN
  Administered 2011-08-16: 4 mg via INTRAVENOUS

## 2011-08-16 MED ORDER — VANCOMYCIN HCL IN DEXTROSE 1-5 GM/200ML-% IV SOLN
INTRAVENOUS | Status: AC
  Filled 2011-08-16: qty 200

## 2011-08-16 SURGICAL SUPPLY — 51 items
18gauge surgical steel ×1 IMPLANT
4x.062 k wire ×2 IMPLANT
BANDAGE ELASTIC 4 VELCRO ST LF (GAUZE/BANDAGES/DRESSINGS) IMPLANT
BANDAGE GAUZE ELAST BULKY 4 IN (GAUZE/BANDAGES/DRESSINGS) IMPLANT
BLADE MINI RND TIP GREEN BEAV (BLADE) IMPLANT
BNDG CMPR 9X4 STRL LF SNTH (GAUZE/BANDAGES/DRESSINGS) ×1
BNDG ESMARK 4X9 LF (GAUZE/BANDAGES/DRESSINGS) ×2 IMPLANT
CLOTH BEACON ORANGE TIMEOUT ST (SAFETY) ×2 IMPLANT
CORDS BIPOLAR (ELECTRODE) ×2 IMPLANT
COVER MAYO STAND STRL (DRAPES) ×1 IMPLANT
COVER SURGICAL LIGHT HANDLE (MISCELLANEOUS) ×2 IMPLANT
CUFF TOURNIQUET SINGLE 18IN (TOURNIQUET CUFF) ×1 IMPLANT
CUFF TOURNIQUET SINGLE 24IN (TOURNIQUET CUFF) IMPLANT
DRAPE OEC MINIVIEW 54X84 (DRAPES) ×1 IMPLANT
DRAPE SURG 17X23 STRL (DRAPES) ×2 IMPLANT
ELECT NDL TIP 2.8 STRL (NEEDLE) IMPLANT
ELECT NEEDLE TIP 2.8 STRL (NEEDLE) IMPLANT
GAUZE XEROFORM 1X8 LF (GAUZE/BANDAGES/DRESSINGS) IMPLANT
GLOVE BIO SURGEON STRL SZ8.5 (GLOVE) ×2 IMPLANT
GOWN SRG XL XLNG 56XLVL 4 (GOWN DISPOSABLE) ×1 IMPLANT
GOWN STRL NON-REIN LRG LVL3 (GOWN DISPOSABLE) ×2 IMPLANT
GOWN STRL NON-REIN XL XLG LVL4 (GOWN DISPOSABLE) ×2
KIT BASIN OR (CUSTOM PROCEDURE TRAY) ×2 IMPLANT
KIT ROOM TURNOVER OR (KITS) ×2 IMPLANT
KWIRE 4.0 X .062IN (WIRE) ×1 IMPLANT
LOOP VESSEL MAXI BLUE (MISCELLANEOUS) IMPLANT
MANIFOLD NEPTUNE II (INSTRUMENTS) ×2 IMPLANT
NDL HYPO 25GX1X1/2 BEV (NEEDLE) IMPLANT
NEEDLE HYPO 25GX1X1/2 BEV (NEEDLE) IMPLANT
NS IRRIG 1000ML POUR BTL (IV SOLUTION) ×2 IMPLANT
PACK ORTHO EXTREMITY (CUSTOM PROCEDURE TRAY) ×2 IMPLANT
PAD ARMBOARD 7.5X6 YLW CONV (MISCELLANEOUS) ×4 IMPLANT
PAD CAST 4YDX4 CTTN HI CHSV (CAST SUPPLIES) IMPLANT
PADDING CAST COTTON 4X4 STRL (CAST SUPPLIES)
PENCIL BUTTON HOLSTER BLD 10FT (ELECTRODE) IMPLANT
SOLUTION BETADINE 4OZ (MISCELLANEOUS) ×2 IMPLANT
SPONGE GAUZE 4X4 12PLY (GAUZE/BANDAGES/DRESSINGS) IMPLANT
SPONGE LAP 4X18 X RAY DECT (DISPOSABLE) ×2 IMPLANT
SPONGE SCRUB IODOPHOR (GAUZE/BANDAGES/DRESSINGS) ×2 IMPLANT
STRIP CLOSURE SKIN 1/2X4 (GAUZE/BANDAGES/DRESSINGS) IMPLANT
SUCTION FRAZIER TIP 10 FR DISP (SUCTIONS) IMPLANT
SUT PROLENE 3 0 PS 2 (SUTURE) IMPLANT
SUT STEEL 7 (SUTURE) ×1 IMPLANT
SUT VIC AB 2-0 FS1 27 (SUTURE) IMPLANT
SYR BULB 3OZ (MISCELLANEOUS) ×2 IMPLANT
SYR CONTROL 10ML LL (SYRINGE) IMPLANT
TOWEL OR 17X24 6PK STRL BLUE (TOWEL DISPOSABLE) ×2 IMPLANT
TOWEL OR 17X26 10 PK STRL BLUE (TOWEL DISPOSABLE) ×4 IMPLANT
TUBE CONNECTING 12X1/4 (SUCTIONS) IMPLANT
UNDERPAD 30X30 INCONTINENT (UNDERPADS AND DIAPERS) ×2 IMPLANT
WATER STERILE IRR 1000ML POUR (IV SOLUTION) ×2 IMPLANT

## 2011-08-16 NOTE — Anesthesia Preprocedure Evaluation (Addendum)
Anesthesia Evaluation  Patient identified by MRN, date of birth, ID band Patient awake    Reviewed: Allergy & Precautions, H&P , NPO status , Patient's Chart, lab work & pertinent test results, reviewed documented beta blocker date and time   Airway Mallampati: II TM Distance: >3 FB Neck ROM: Full    Dental No notable dental hx. (+) Edentulous Upper and Partial Lower   Pulmonary neg pulmonary ROS, shortness of breath, COPDCurrent Smoker,  breath sounds clear to auscultation  Pulmonary exam normal       Cardiovascular hypertension, Pt. on home beta blockers and Pt. on medications + CAD negative cardio ROS  + Cardiac Defibrillator Rhythm:Regular Rate:Normal     Neuro/Psych PSYCHIATRIC DISORDERS Anxiety Depression negative neurological ROS  negative psych ROS   GI/Hepatic negative GI ROS, Neg liver ROS, GERD-  Medicated,  Endo/Other  negative endocrine ROS  Renal/GU negative Renal ROS  negative genitourinary   Musculoskeletal  (+) Fibromyalgia -  Abdominal   Peds  Hematology negative hematology ROS (+)   Anesthesia Other Findings   Reproductive/Obstetrics negative OB ROS                         Anesthesia Physical Anesthesia Plan  ASA: III  Anesthesia Plan: General   Post-op Pain Management:    Induction: Intravenous  Airway Management Planned: Oral ETT  Additional Equipment:   Intra-op Plan:   Post-operative Plan: Extubation in OR  Informed Consent: I have reviewed the patients History and Physical, chart, labs and discussed the procedure including the risks, benefits and alternatives for the proposed anesthesia with the patient or authorized representative who has indicated his/her understanding and acceptance.   Dental advisory given  Plan Discussed with: CRNA, Anesthesiologist and Surgeon  Anesthesia Plan Comments:        Anesthesia Quick Evaluation

## 2011-08-16 NOTE — Transfer of Care (Signed)
Immediate Anesthesia Transfer of Care Note  Patient: Sydney Phillips  Procedure(s) Performed: Procedure(s) (LRB): OPEN REDUCTION INTERNAL FIXATION (ORIF) ELBOW/OLECRANON FRACTURE (Left)  Patient Location: PACU  Anesthesia Type: General  Level of Consciousness: awake and alert   Airway & Oxygen Therapy: Patient Spontanous Breathing and Patient connected to face mask oxygen  Post-op Assessment: Report given to PACU RN  Post vital signs: Reviewed and stable  Complications: No apparent anesthesia complications

## 2011-08-16 NOTE — Anesthesia Postprocedure Evaluation (Signed)
  Anesthesia Post-op Note  Patient: Sydney Phillips  Procedure(s) Performed: Procedure(s) (LRB): OPEN REDUCTION INTERNAL FIXATION (ORIF) ELBOW/OLECRANON FRACTURE (Left)  Patient Location: PACU  Anesthesia Type: GA combined with regional for post-op pain  Level of Consciousness: awake, alert  and oriented  Airway and Oxygen Therapy: Patient Spontanous Breathing  Post-op Pain: mild  Post-op Assessment: Post-op Vital signs reviewed, Patient's Cardiovascular Status Stable, Respiratory Function Stable, Patent Airway and No signs of Nausea or vomiting  Post-op Vital Signs: Reviewed and stable  Complications: No apparent anesthesia complications

## 2011-08-16 NOTE — Discharge Instructions (Signed)
Elbow Fracture with Open Reduction and Internal Fixation (ORIF) A fracture (break in bone) of the elbow means one of the bones that comprises the elbow is broken. If fractures are not displaced (separated), they may be treated conservatively. That means that only a sling or splint may be required for two to three weeks. Often, elbow fractures are treated by early range of motion exercises to prevent the elbow from getting stiff. If fractures are large and not stable, an operation may be required to put the bones back into proper position and hold them in place with:  Pins.   Plates.   Screws.  This is called open reduction and internal fixation (ORIF). The main goal of treating fractured elbows is to get the bones back into position and keep them in place. This goal gives the best chance of an elbow that:  Works as normally as possible.   Has the optimum range of motion.  DIAGNOSIS  The diagnosis of a fractured elbow is made by x-ray. These will be required before and after the elbow is fixed. RISKS AND COMPLICATIONS All surgery is associated with risks. Some of these risks are:  Excessive bleeding.   Failure to heal properly (non-union).   Infection.   Stiffness of elbow following injury.   Damage to one of the nerves around the elbow producing numbness or weakness.  LET YOUR CAREGIVER KNOW ABOUT:  Allergies.   Medications taken including herbs, eye drops, over the counter medications, and creams.   Use of steroids (by mouth or creams).   Previous problems with anesthetics or novocaine.   Any numbness or tingling in your hand/forearm.   Possibility of pregnancy, if this applies.   History of blood clots (thrombophlebitis).   History of bleeding or blood problems.   Previous surgery.   Other health problems.   Family history of anesthetic problems  PROCEDURE  You will be given an anesthetic which will keep you pain free during surgery. This will be accomplished by a  general anesthetic (you go to sleep) or regional anesthesia (your arm is made numb). After the surgery you will be taken to the recovery area where a nurse will monitor your progress. When you are stable, taking fluids well and provided there are no complications, you will be allowed to return to your hospital room or possibly even go home. AFTER THE PROCEDURE   Only take over-the-counter or prescription medicines for pain, discomfort, or fever as directed by your caregiver.   You may use ice for 15 to 20 minutes, 3 to 4 times per day, for the first 2 to 3 days.   Change dressings and see your caregiver as directed.   Your caregiver will instruct you when to begin using and exercising your arm and elbow.  SEEK IMMEDIATE MEDICAL CARE IF:  There is redness, swelling, or increasing pain in the surgical area.   Pus, blood or unusual drainage is coming from the area or is visible on the dressings or cast.   An unexplained oral temperature above 102 F (38.9 C) develops.   You notice a foul smell coming from the surgical site or dressing.   The wound breaks open (edges are not staying together) after stitches have been removed.   You develop increasing pain or increasing pain with motion of your fingers.   There is numbness or tingling in your hand or forearm.   You have any other questions or concerns following surgery.  Document Released: 11/26/2000 Document Revised:  02/12/2011 Document Reviewed: 06/19/2008 [facility] Patient Information 2012 [place], [place].

## 2011-08-16 NOTE — Op Note (Signed)
See dictated note [phone]

## 2011-08-16 NOTE — Brief Op Note (Signed)
08/16/2011  9:15 AM  PATIENT:  Sydney Phillips  76 y.o. female  PRE-OPERATIVE DIAGNOSIS:  LEFT OLECRONON FRACTURE  POST-OPERATIVE DIAGNOSIS:  left olecronon fracture  PROCEDURE:  Procedure(s) (LRB): OPEN REDUCTION INTERNAL FIXATION (ORIF) ELBOW/OLECRANON FRACTURE (Left)  SURGEON:  Surgeon(s) and Role:    * Marlowe Shores, MD - Primary  PHYSICIAN ASSISTANT:   ASSISTANTS: none   ANESTHESIA:   general  EBL:     BLOOD ADMINISTERED:none  DRAINS: none   LOCAL MEDICATIONS USED:  NONE  SPECIMEN:  No Specimen  DISPOSITION OF SPECIMEN:  N/A  COUNTS:  YES  TOURNIQUET:   Total Tourniquet Time Documented: Upper Arm (Left) - 55 minutes  DICTATION: .Other Dictation: Dictation Number [id number]  PLAN OF CARE: Discharge to home after PACU  PATIENT DISPOSITION:  PACU - hemodynamically stable.   Delay start of Pharmacological VTE agent (>24hrs) due to surgical blood loss or risk of bleeding: not applicable

## 2011-08-16 NOTE — Op Note (Signed)
NAMEKALIANA, ALBINO             ACCOUNT NO.:  [id number]  MEDICAL RECORD NO.:  [id number]  LOCATION:  [facility]                         FACILITY:  [facility]  PHYSICIAN:  Artist Pais. Westley Blass, M.D.DATE OF BIRTH:  June 20, 1935  DATE OF PROCEDURE:  08/16/2011 DATE OF DISCHARGE:                              OPERATIVE REPORT   PREOPERATIVE DIAGNOSIS:  Displaced left olecranon fracture.  POSTOPERATIVE DIAGNOSIS:  Displaced left olecranon fracture.  PROCEDURE:  Open reduction and internal fixation of above using tension band wiring.  SURGEON:  Artist Pais. Mina Marble, MD  ASSISTANT:  None.  ANESTHESIA:  General.  TOURNIQUET TIME:  55 minutes.  No complication.  No drains.  The patient was taken to the operating suite.  After induction of adequate general anesthesia, left upper extremity was prepped and draped in sterile fashion.  Esmarch was used to exsanguinate the limb. Tourniquet was inflated to 275 mmHg.  At this point, arm was placed over the chest over a padded Mayo stand.  A posterior approach to the elbow was undertaken, skin was incised sharply.  Dissection was carried down the fracture site.  There was significant clot in the fracture, was debrided with a rongeur, and was thoroughly irrigated.  The fracture fragments were identified.  Reduction was performed with reduction clamp.  Once this was done, 6.2 K-wires were driven from proximal and distal across the fracture site under direct and fluoroscopic guidance. Adequate pin position was obtained.  Next, a small transosseous canal was placed distal to the fracture site on the ulna, an 18-gauge wire was then passed through it, and tension band was created, thus reducing the fracture on the appropriate tension.  The K-wires were cut outside the skin, bent upon themselves, driven into the proximal fragment. Intraoperative fluoroscopy revealed adequate reduction and AP and lateral oblique view. The wound was thoroughly irrigated and  closed in layers with 2-0 undyed Vicryl and staples on the skin.  Xeroform, 4x4s, fluffs, and a posterior elbow splint was applied.  The patient tolerated the procedure well and went to recovery room in stable fashion.     Artist Pais Mina Marble, M.D.     MAW/MEDQ  D:  08/16/2011  T:  08/16/2011  Job:  [id number]

## 2011-08-16 NOTE — H&P (Signed)
Sydney Phillips is an 76 y.o. female.   Chief Complaint: left elbow pain HPI: 76 y/o female s/p fall with displaced left olecranon fracture  Past Medical History  Diagnosis Date  . COPD (chronic obstructive pulmonary disease)   . Hx of colonic polyps   . Insomnia   . Fibromyalgia   . Hyperlipemia     takes Crestor daily  . CAD (coronary artery disease)   . Vertigo     takes Meclizine prn  . Subclavian arterial stenosis, lt, with PTA/STENT 07/31/11 08/01/2011  . S/P angioplasty with stent, lt. subclavian 07/31/11 08/01/2011  . Hypertension     takes Amlodipine and Metoprolol daily  . Peripheral vascular disease   . Bronchitis   . Shortness of breath     with exertion  . Arthritis   . Joint pain   . Joint swelling   . Chronic back pain   . GERD (gastroesophageal reflux disease)     was on meds but was taken off;now watches what she eats  . Chronic constipation   . Hemorrhoids   . Urinary incontinence   . Early cataracts, bilateral   . Anxiety   . Depression     takes Cymbalta daily    Past Surgical History  Procedure Date  . Appendectomy   . Tonsillectomy   . Tubal ligation   . Cardiac defibrillator placement 05/2008    By Dr [name] HAVE MRI's  . Abdominal hysterectomy   . Back surgery 2012    Family History  Problem Relation Age of Onset  . Colon cancer    . Hyperlipidemia    . Anesthesia problems Neg Hx   . Hypotension Neg Hx   . Malignant hyperthermia Neg Hx   . Pseudochol deficiency Neg Hx    Social History:  reports that she has been smoking Cigarettes.  She has a 60 pack-year smoking history. She has never used smokeless tobacco. She reports that she does not drink alcohol or use illicit drugs.  Allergies:  Allergies  Allergen Reactions  . Codeine Itching  . Darvon Itching  . Meloxicam Other (See Comments)    Unknown    . Propoxyphene N-Acetaminophen Itching  . Rofecoxib Other (See Comments)    Unknown    . Azithromycin Rash  .  Erythromycin Rash  . Penicillins Rash  . Sulfa Antibiotics Rash    No current facility-administered medications on file as of 08/16/2011.   Medications Prior to Admission  Medication Sig Dispense Refill  . ALPRAZolam (XANAX) 1 MG tablet Take 1 mg by mouth 4 (four) times daily as needed. For anxiety.      . clopidogrel (PLAVIX) 75 MG tablet Take 75 mg by mouth daily.        . metoprolol tartrate (LOPRESSOR) 25 MG tablet Take 12.5 mg by mouth 2 (two) times daily.       . potassium chloride SA (K-DUR,KLOR-CON) 20 MEQ tablet Take 20 mEq by mouth 3 (three) times daily.         No results found for this or any previous visit (from the past 48 hour(s)). No results found.  Review of Systems  All other systems reviewed and are negative.    Blood pressure 157/76, pulse 80, temperature 97.7 F (36.5 C), temperature source Oral, resp. rate 16, height 5\' 3"  (1.6 m), weight 87.7 kg (193 lb 5.5 oz), SpO2 97.00%. Physical Exam  Constitutional: She is oriented to person, place, and time. She appears well-developed and well-nourished.  HENT:  Head: Normocephalic and atraumatic.  Eyes: Pupils are equal, round, and reactive to light.  Neck: Normal range of motion.  Cardiovascular: Normal rate.   Respiratory: Effort normal.  Musculoskeletal:       Left elbow: She exhibits deformity. tenderness found. Olecranon process tenderness noted.  Neurological: She is alert and oriented to person, place, and time.  Skin: Skin is warm.  Psychiatric: She has a normal mood and affect. Her speech is normal and behavior is normal. Thought content normal.     Assessment/Plan 76 y/o fermale with displaced left olecranon fracture  Plan orif above today as outpatient Kimble Delaurentis A 08/16/2011, 7:18 AM

## 2011-08-16 NOTE — Anesthesia Procedure Notes (Signed)
Anesthesia Regional Block:  Supraclavicular block  Pre-Anesthetic Checklist: ,, timeout performed, Correct Patient, Correct Site, Correct Laterality, Correct Procedure, Correct Position, site marked, Risks and benefits discussed, pre-op evaluation, post-op pain management  Laterality: Left  Prep: Maximum Sterile Barrier Precautions used and chloraprep       Needles:  Injection technique: Single-shot  Needle Type: Echogenic Stimulator Needle      Needle Gauge: 22 and 22 G    Additional Needles:  Procedures: ultrasound guided and nerve stimulator Supraclavicular block  Nerve Stimulator or Paresthesia:  Response: biceps, 0.5 mA,   Additional Responses:   Narrative:  Start time: 08/16/2011 7:25 AM End time: 08/16/2011 7:48 AM Injection made incrementally with aspirations every 5 mL. Anesthesiologist: Jb Dulworth,MD  Additional Notes: 2% Lidocaine skin wheel. Intercostobrachial with 10cc of 0.25% Bupivicaine plain    Supraclavicular block

## 2011-08-18 ENCOUNTER — Ambulatory Visit: Payer: Medicare Other | Admitting: Family Medicine

## 2011-08-20 ENCOUNTER — Encounter (HOSPITAL_COMMUNITY): Payer: Self-pay | Admitting: Orthopedic Surgery

## 2011-08-30 ENCOUNTER — Other Ambulatory Visit: Payer: Self-pay | Admitting: Family Medicine

## 2011-09-01 ENCOUNTER — Other Ambulatory Visit: Payer: Self-pay | Admitting: Family Medicine

## 2011-09-01 NOTE — Telephone Encounter (Signed)
Call in #120 with 5 rf 

## 2011-09-01 NOTE — Telephone Encounter (Signed)
Pt last seen 07/21/11.  Pls advise.   

## 2011-09-15 ENCOUNTER — Telehealth: Payer: Self-pay | Admitting: Family Medicine

## 2011-09-15 NOTE — Telephone Encounter (Signed)
Pt called req samaples of Cymbalta 60 mg.

## 2011-09-16 NOTE — Telephone Encounter (Signed)
Per Dr. Fabian Sharp, give pt 3 boxes. I spoke with pt and samples are ready for pick up.

## 2011-09-16 NOTE — Telephone Encounter (Signed)
Pt called back to check on status of getting samples of Cymbalta 60 mg. Pt is completely out of meds.

## 2011-10-01 ENCOUNTER — Encounter: Payer: Self-pay | Admitting: Family Medicine

## 2011-10-01 ENCOUNTER — Ambulatory Visit (INDEPENDENT_AMBULATORY_CARE_PROVIDER_SITE_OTHER): Payer: Medicare Other | Admitting: Family Medicine

## 2011-10-01 DIAGNOSIS — R55 Syncope and collapse: Secondary | ICD-10-CM

## 2011-10-01 DIAGNOSIS — G4700 Insomnia, unspecified: Secondary | ICD-10-CM

## 2011-10-01 DIAGNOSIS — K5900 Constipation, unspecified: Secondary | ICD-10-CM

## 2011-10-01 MED ORDER — TEMAZEPAM 30 MG PO CAPS: 30.0000 mg | ORAL_CAPSULE | Freq: Every evening | ORAL | Status: DC | PRN

## 2011-10-01 NOTE — Progress Notes (Signed)
  Subjective:    Patient ID: Sydney Phillips, female    DOB: 12/16/1935, 76 y.o.   MRN: [id number]  HPI Here to discuss several issues. She recently had surgery to repair a fracture to the left elbow after a fall. She is recovering well from this, and now has almost full ROM of the arm. The fall resulted from a syncopal episode when she was reaching up over her head to turn on a ceiling fan. Workup revealed that this probably stemmed from an occlusion of the left subclavian artery, and she had this stented on 07-31-11 per Dr. Allyson Sabal. This was succesful, and she has had no further syncope. She asks about help with sleeping. She has had insomnia off and on for years, and at one point used temazepam for this. She asks to try this again. Her Xanax helps with daytime anxiety but not with sleep. Also she has chronic constipation. She has tried Miralax but did not tolerate this due to loose stools, even at low dosages. She has used sodium docusate with poor results. She drinks plenty of water. She has used magnesium citrate OTC when she gets really backed up and this clears her out, but it is too strong for frequent use. No abdominal pains or fever or nausea.    Review of Systems  Constitutional: Negative.   Respiratory: Negative.   Cardiovascular: Negative.   Gastrointestinal: Positive for constipation. Negative for nausea, vomiting, abdominal pain, diarrhea, blood in stool, abdominal distention and rectal pain.  Neurological: Negative.        Objective:   Physical Exam  Constitutional: She appears well-developed and well-nourished.  Cardiovascular: Normal rate, regular rhythm, normal heart sounds and intact distal pulses.   Pulmonary/Chest: Effort normal and breath sounds normal.  Abdominal: Soft. Bowel sounds are normal. She exhibits no distension and no mass. There is no tenderness. There is no rebound and no guarding.          Assessment & Plan:  Try Temazepam for sleep. Try taking one tbsp  of milk of magnesia bid. Recheck prn

## 2011-10-28 ENCOUNTER — Other Ambulatory Visit: Payer: Self-pay | Admitting: Family Medicine

## 2011-10-29 ENCOUNTER — Encounter: Payer: Self-pay | Admitting: Family Medicine

## 2011-10-29 ENCOUNTER — Ambulatory Visit (INDEPENDENT_AMBULATORY_CARE_PROVIDER_SITE_OTHER): Payer: Medicare Other | Admitting: Family Medicine

## 2011-10-29 VITALS — BP 158/92 | HR 107 | Temp 98.6°F | Wt 190.0 lb

## 2011-10-29 DIAGNOSIS — M797 Fibromyalgia: Secondary | ICD-10-CM

## 2011-10-29 DIAGNOSIS — IMO0001 Reserved for inherently not codable concepts without codable children: Secondary | ICD-10-CM

## 2011-10-29 DIAGNOSIS — I10 Essential (primary) hypertension: Secondary | ICD-10-CM

## 2011-10-29 DIAGNOSIS — F329 Major depressive disorder, single episode, unspecified: Secondary | ICD-10-CM

## 2011-10-29 MED ORDER — AMLODIPINE BESYLATE 10 MG PO TABS: 10.0000 mg | ORAL_TABLET | Freq: Every day | ORAL | Status: DC

## 2011-10-29 MED ORDER — FUROSEMIDE 80 MG PO TABS: 80.0000 mg | ORAL_TABLET | Freq: Two times a day (BID) | ORAL | Status: DC

## 2011-10-29 MED ORDER — ROSUVASTATIN CALCIUM 20 MG PO TABS: 20.0000 mg | ORAL_TABLET | Freq: Every day | ORAL | Status: DC

## 2011-10-29 NOTE — Telephone Encounter (Signed)
Call in #[phone] with 5 rf 

## 2011-10-29 NOTE — Progress Notes (Signed)
  Subjective:    Patient ID: Sydney Phillips, female    DOB: Sep 07, 1935, 76 y.o.   MRN: [id number]  HPI Here for refills. She is actually doing very well. She has cut her Cymbalta down to one pill a day. Her BP had been stable until she ran out of some meds a week ago. She is getting around well with her cane, and she is driving short distances.    Review of Systems  Constitutional: Negative.   Respiratory: Negative.   Cardiovascular: Negative.        Objective:   Physical Exam  Constitutional: She appears well-developed and well-nourished.  Cardiovascular: Normal rate, regular rhythm, normal heart sounds and intact distal pulses.   Pulmonary/Chest: Effort normal and breath sounds normal.  Psychiatric: She has a normal mood and affect. Her behavior is normal. Thought content normal.          Assessment & Plan:  She is doing well in general. Her depression and fibromyalgia are improved. Refilled her meds. She is to see Dr. Allyson Sabal next week.

## 2011-12-29 ENCOUNTER — Telehealth: Payer: Self-pay | Admitting: Family Medicine

## 2011-12-29 NOTE — Telephone Encounter (Signed)
Pt needs samples of cymbalta 60 mg °

## 2011-12-29 NOTE — Telephone Encounter (Signed)
I gave pt samples

## 2011-12-31 ENCOUNTER — Encounter: Payer: Self-pay | Admitting: Family Medicine

## 2011-12-31 ENCOUNTER — Ambulatory Visit (INDEPENDENT_AMBULATORY_CARE_PROVIDER_SITE_OTHER): Payer: Medicare Other | Admitting: Family Medicine

## 2011-12-31 VITALS — BP 146/74 | HR 86 | Temp 97.9°F | Wt 196.0 lb

## 2011-12-31 DIAGNOSIS — E876 Hypokalemia: Secondary | ICD-10-CM

## 2011-12-31 DIAGNOSIS — E785 Hyperlipidemia, unspecified: Secondary | ICD-10-CM

## 2011-12-31 DIAGNOSIS — IMO0001 Reserved for inherently not codable concepts without codable children: Secondary | ICD-10-CM

## 2011-12-31 DIAGNOSIS — M545 Low back pain, unspecified: Secondary | ICD-10-CM

## 2011-12-31 DIAGNOSIS — M791 Myalgia, unspecified site: Secondary | ICD-10-CM

## 2011-12-31 DIAGNOSIS — I2510 Atherosclerotic heart disease of native coronary artery without angina pectoris: Secondary | ICD-10-CM

## 2011-12-31 MED ORDER — POTASSIUM CHLORIDE CRYS ER 20 MEQ PO TBCR: 20.0000 meq | EXTENDED_RELEASE_TABLET | Freq: Two times a day (BID) | ORAL | Status: DC

## 2011-12-31 NOTE — Progress Notes (Signed)
  Subjective:    Patient ID: Sydney Phillips, female    DOB: [birth year], 76 y.o.   MRN: [id number]  HPI Here for several months of weakness and cramps in both legs. She wonders if this could be related to her spinal surgery. The legs actually knot up and cramp as opposed to having aches. No numbness. She had been taking a total of 60 mEq of potassium a day until she decided to stop taking this altogether about 3 months ago. In addition, Dr. Allyson Sabal started her on Crestor about 4 months ago. No chest pain or SOB.    Review of Systems  Respiratory: Negative.   Cardiovascular: Negative.   Musculoskeletal: Positive for myalgias.  Neurological: Positive for weakness.       Objective:   Physical Exam  Constitutional: She appears well-developed and well-nourished. No distress.       Walks slowly with her walker   Cardiovascular: Normal rate, regular rhythm, normal heart sounds and intact distal pulses.   Pulmonary/Chest: Effort normal and breath sounds normal.  Musculoskeletal: Normal range of motion. She exhibits no edema and no tenderness.          Assessment & Plan:  She seems to be having muscle cramps as opposed to sciatic problems, so I don't think her spinal issues are involved. These could be side effects of the Crestor, so for now I told her to stop taking this. Check a CK level today. She also likely has a very low potassium level again, so I will start her back on potassium 20 mEq bid. Check a BMET today. She is to follow up with Dr. Allyson Sabal on 01-28-12.

## 2012-01-01 LAB — BASIC METABOLIC PANEL
BUN: 13 mg/dL (ref 6–23)
CO2: 32 mEq/L (ref 19–32)
Chloride: 95 mEq/L — ABNORMAL LOW (ref 96–112)
Creatinine, Ser: 1.1 mg/dL (ref 0.4–1.2)
Glucose, Bld: 132 mg/dL — ABNORMAL HIGH (ref 70–99)
Potassium: 3.5 mEq/L (ref 3.5–5.1)

## 2012-01-02 NOTE — Progress Notes (Signed)
Quick Note:  I spoke with pt ______ 

## 2012-01-13 ENCOUNTER — Telehealth: Payer: Self-pay | Admitting: Family Medicine

## 2012-01-13 NOTE — Telephone Encounter (Signed)
Pt requesting samples for symbalta

## 2012-01-13 NOTE — Telephone Encounter (Signed)
Samples are ready for pick up and I spoke with pt. 

## 2012-01-28 ENCOUNTER — Telehealth: Payer: Self-pay | Admitting: Family Medicine

## 2012-01-28 NOTE — Telephone Encounter (Signed)
Patient called stating that she would like to have some samples of cymbalta. Please assist.

## 2012-01-28 NOTE — Telephone Encounter (Signed)
Spoke with pt - informed we dont have any samples at this time - please check back next week

## 2012-01-28 NOTE — Telephone Encounter (Signed)
Yes please get her whatever you can find

## 2012-01-28 NOTE — Telephone Encounter (Signed)
please advise ok for samples

## 2012-02-10 ENCOUNTER — Encounter (HOSPITAL_COMMUNITY): Payer: Self-pay | Admitting: Pharmacy Technician

## 2012-02-13 ENCOUNTER — Ambulatory Visit
Admission: RE | Admit: 2012-02-13 | Discharge: 2012-02-13 | Disposition: A | Discharge status: Home / Self Care | Payer: Medicare Other | Source: Ambulatory Visit | Attending: Cardiovascular Disease | Admitting: Cardiovascular Disease

## 2012-02-13 ENCOUNTER — Other Ambulatory Visit: Payer: Self-pay | Admitting: Cardiovascular Disease

## 2012-02-13 DIAGNOSIS — Z01818 Encounter for other preprocedural examination: Secondary | ICD-10-CM

## 2012-02-19 ENCOUNTER — Other Ambulatory Visit: Payer: Self-pay | Admitting: Cardiovascular Disease

## 2012-02-19 MED ORDER — VANCOMYCIN HCL IN DEXTROSE 1-5 GM/200ML-% IV SOLN
1000.0000 mg | INTRAVENOUS | Status: DC
  Filled 2012-02-19: qty 200

## 2012-02-19 MED ORDER — SODIUM CHLORIDE 0.9 % IR SOLN
80.0000 mg | Status: DC
  Filled 2012-02-19: qty 2

## 2012-02-20 ENCOUNTER — Ambulatory Visit (HOSPITAL_COMMUNITY)
Admission: RE | Admit: 2012-02-20 | Discharge: 2012-02-20 | Disposition: A | Discharge status: Home / Self Care | Payer: Medicare Other | Source: Ambulatory Visit | Attending: Cardiovascular Disease | Admitting: Cardiovascular Disease

## 2012-02-20 ENCOUNTER — Encounter (HOSPITAL_COMMUNITY)
Admission: RE | Disposition: A | Discharge status: Home / Self Care | Payer: Self-pay | Source: Ambulatory Visit | Attending: Cardiovascular Disease

## 2012-02-20 DIAGNOSIS — Z4502 Encounter for adjustment and management of automatic implantable cardiac defibrillator: Secondary | ICD-10-CM | POA: Insufficient documentation

## 2012-02-20 HISTORY — PX: BIV ICD GENERTAOR CHANGE OUT: SHX5745

## 2012-02-20 LAB — SURGICAL PCR SCREEN: MRSA, PCR: NEGATIVE

## 2012-02-20 SURGERY — BIV ICD GENERTAOR CHANGE OUT
Anesthesia: LOCAL | Laterality: Left

## 2012-02-20 MED ORDER — HYDROCODONE-ACETAMINOPHEN 5-325 MG PO TABS: 1.0000 | ORAL_TABLET | ORAL | Status: DC | PRN

## 2012-02-20 MED ORDER — MIDAZOLAM HCL 2 MG/2ML IJ SOLN
INTRAMUSCULAR | Status: AC
  Filled 2012-02-20: qty 2

## 2012-02-20 MED ORDER — SODIUM CHLORIDE 0.9 % IJ SOLN: 3.0000 mL | INTRAMUSCULAR | Status: DC | PRN

## 2012-02-20 MED ORDER — ACETAMINOPHEN 325 MG PO TABS
325.0000 mg | ORAL_TABLET | ORAL | Status: DC | PRN
  Filled 2012-02-20: qty 2

## 2012-02-20 MED ORDER — LIDOCAINE HCL (PF) 1 % IJ SOLN
INTRAMUSCULAR | Status: AC
  Filled 2012-02-20: qty 60

## 2012-02-20 MED ORDER — FENTANYL CITRATE 0.05 MG/ML IJ SOLN
INTRAMUSCULAR | Status: AC
  Filled 2012-02-20: qty 2

## 2012-02-20 MED ORDER — CHLORHEXIDINE GLUCONATE 4 % EX LIQD: 60.0000 mL | Freq: Once | CUTANEOUS | Status: DC

## 2012-02-20 MED ORDER — SODIUM CHLORIDE 0.45 % IV SOLN
INTRAVENOUS | Status: DC
  Administered 2012-02-20: 08:00:00 via INTRAVENOUS

## 2012-02-20 MED ORDER — ONDANSETRON HCL 4 MG/2ML IJ SOLN: 4.0000 mg | Freq: Four times a day (QID) | INTRAMUSCULAR | Status: DC | PRN

## 2012-02-20 MED ORDER — MUPIROCIN 2 % EX OINT
TOPICAL_OINTMENT | Freq: Two times a day (BID) | CUTANEOUS | Status: DC
  Administered 2012-02-20: 08:00:00 via NASAL

## 2012-02-20 MED ORDER — HEPARIN (PORCINE) IN NACL 2-0.9 UNIT/ML-% IJ SOLN
INTRAMUSCULAR | Status: AC
  Filled 2012-02-20: qty 1000

## 2012-02-20 MED ORDER — MUPIROCIN 2 % EX OINT
TOPICAL_OINTMENT | CUTANEOUS | Status: AC
  Filled 2012-02-20: qty 22

## 2012-02-20 MED ORDER — VANCOMYCIN HCL IN DEXTROSE 1-5 GM/200ML-% IV SOLN
INTRAVENOUS | Status: AC
  Filled 2012-02-20: qty 200

## 2012-02-20 NOTE — H&P (Signed)
Date of Initial H&P: 02/09/2012  History reviewed, patient examined, no change in status, stable for surgery. Elective CRT-D generator changeout due to device at elective replacement interval. This procedure has been fully reviewed with the patient and written informed consent has been obtained.  Thurmon Fair, MD, [facility] [phone] office [phone] pager 02/20/2012 8:37 AM

## 2012-02-20 NOTE — CV Procedure (Signed)
Sydney Phillips, Sydney Phillips Female, 76 y.o., 11-27-1935  MRN: [id number]  CSN: [id number] Admit Dt: 02/20/12     Procedure report  Procedure performed:  1. Dual chamber biventricular pacemaker defibrillator generator changeout  2. Light sedation  Reason for procedure:  1. Device generator at elective replacement interval  Procedure performed by:  Thurmon Fair, MD  Complications:  None  Estimated blood loss:  <5 mL  Medications administered during procedure:  Vancomycin 1 g intravenously, lidocaine 1% 30 mL locally, fentanyl 50 mcg intravenously, Versed 2 mg intravenously Device details:   Therapist, art model number [id number]., serial number [id number] Right atrial lead (chronic) [employer] [phone], serial [id number] (implanted 05/17/2008) Right ventricular lead (chronic)  [employer] [phone], serial number [id number] (implanted 05/17/2008) Left ventricular lead  Medtronic 4194, serial number [id number] (implanted 05/17/2008) Explanted generator Medtronic [place],  model number [id number], serial number  PVD [id number] (implanted 05/17/2008)  Procedure details:  After the risks and benefits of the procedure were discussed the patient provided informed consent. She was brought to the cardiac catheter lab in the fasting state. The patient was prepped and draped in usual sterile fashion. Local anesthesia with 1% lidocaine was administered to to the left infraclavicular area. A 5-6cm horizontal incision was made parallel with and 2-3 cm caudal to the left clavicle. An older scar was seen closer to the left clavicle. Using minimal electrocautery and mostly sharp and blunt dissection the prepectoral pocket was opened carefully to avoid injury to the loops of chronic leads. Extensive dissection was not necessary. The device was explanted. The pocket was carefully inspected for hemostasis and flushed with copious amounts of antibiotic solution.  The leads were disconnected from the old  generator and testing of the lead parameters showed excellent values. The new generator was connected to the chronic leads, with appropriate pacing noted.   The entire system was then carefully inserted in the pocket with care been taking that the leads and device assumed a comfortable position without pressure on the incision. Great care was taken that the leads be located deep to the generator. The pocket was then closed in layers using 2 layers of 2-0 Vicryl and cutaneous staples after which a sterile dressing was applied.   At the end of the procedure the following lead parameters were encountered:   Right atrial lead sensed P waves 4.4 mV, impedance 513 ohms, threshold 0.5V.  at 0.5 ms pulse width.  Right ventricular lead sensed R waves  Greater than 20 mV, impedance 950 ohms, threshold 0.75V. at 0.5 ms pulse width.  Left ventricular lead ( LV tip to RV coil configuration)  impedance 437 ohms, threshold 2.0 V at 1.0 ms pulse width.  Thurmon Fair, MD, [facility] [phone] office [phone] pager 02/20/2012 9:42 AM

## 2012-02-23 ENCOUNTER — Telehealth: Payer: Self-pay | Admitting: Family Medicine

## 2012-02-23 NOTE — Telephone Encounter (Signed)
Pt  Requesting samples of Cymbalta 60 mg

## 2012-02-23 NOTE — Telephone Encounter (Signed)
I spoke with pt, we do not have any more samples.

## 2012-02-26 ENCOUNTER — Telehealth: Payer: Self-pay | Admitting: Family Medicine

## 2012-02-26 NOTE — Telephone Encounter (Signed)
Pt would like samples of cymbalta 60mg. °

## 2012-02-27 NOTE — Telephone Encounter (Signed)
Can you call pt and let her know that we do not have any samples?

## 2012-02-27 NOTE — Telephone Encounter (Signed)
Called and lft vm for pt that there are no samples of Cymbalta avail.

## 2012-03-25 ENCOUNTER — Other Ambulatory Visit: Payer: Self-pay | Admitting: Family Medicine

## 2012-03-26 ENCOUNTER — Telehealth: Payer: Self-pay | Admitting: Family Medicine

## 2012-03-26 NOTE — Telephone Encounter (Signed)
Call in #120 with 5 rf 

## 2012-03-26 NOTE — Telephone Encounter (Signed)
Patient called stating that she need a refill of her aprazolam as she has requested this through [employer] [facility] and has not received anything. Please assist.

## 2012-03-27 ENCOUNTER — Other Ambulatory Visit: Payer: Self-pay | Admitting: Family Medicine

## 2012-03-29 ENCOUNTER — Telehealth: Payer: Self-pay | Admitting: Family Medicine

## 2012-03-29 MED ORDER — ALPRAZOLAM 1 MG PO TABS: 1.0000 mg | ORAL_TABLET | Freq: Four times a day (QID) | ORAL | Status: DC | PRN

## 2012-03-29 NOTE — Telephone Encounter (Signed)
Please call patient med [facility] today

## 2012-03-29 NOTE — Telephone Encounter (Signed)
Opened in error

## 2012-03-29 NOTE — Telephone Encounter (Signed)
Pt called to check on status of getting her refill of Alprazolam called in to [facility]. Pls call in today. Pt is completely out of med.

## 2012-03-29 NOTE — Telephone Encounter (Signed)
I called in script 

## 2012-05-03 ENCOUNTER — Other Ambulatory Visit: Payer: Self-pay | Admitting: Family Medicine

## 2012-05-04 NOTE — Telephone Encounter (Signed)
Call in #[phone] with 5 rf 

## 2012-05-05 ENCOUNTER — Encounter: Payer: Self-pay | Admitting: Family Medicine

## 2012-05-05 ENCOUNTER — Ambulatory Visit (INDEPENDENT_AMBULATORY_CARE_PROVIDER_SITE_OTHER): Payer: Medicare Other | Admitting: Family Medicine

## 2012-05-05 VITALS — BP 154/84 | HR 117 | Temp 98.5°F | Wt 186.0 lb

## 2012-05-05 DIAGNOSIS — R531 Weakness: Secondary | ICD-10-CM

## 2012-05-05 DIAGNOSIS — Z23 Encounter for immunization: Secondary | ICD-10-CM

## 2012-05-05 DIAGNOSIS — I509 Heart failure, unspecified: Secondary | ICD-10-CM

## 2012-05-05 DIAGNOSIS — R0602 Shortness of breath: Secondary | ICD-10-CM

## 2012-05-05 DIAGNOSIS — R5383 Other fatigue: Secondary | ICD-10-CM

## 2012-05-05 NOTE — Telephone Encounter (Signed)
Call in #[phone] with 5 rf 

## 2012-05-05 NOTE — Telephone Encounter (Signed)
Hydrocodone-Acetaminophen 10-660 MG refill request. Med last filled on 10/28/11 qty 60 with 5 refills. Pt last seen on 7/17. Ok to refill?

## 2012-05-05 NOTE — Progress Notes (Signed)
  Subjective:    Patient ID: Sydney Phillips, female    DOB: October 03, 1935, 76 y.o.   MRN: [id number]  HPI Here to follow up and to discuss generalized weakness. We saw her in the summer and we were concerned about her potassium level. She takes Lasix which depletes her potassium but she cannot tolerate most any form of oral potassium that she has tried. Her level in July was at the low end of normal at 3.5, but I am worried it has dropped again. She describes her legs feeling weak and unsteady, and she uses a cane to help her balance. She gets very fatigued with walking short distances and has to sit down to rest often. She says her hands get fatigued quickly when doing something like peeling potatoes and she has to stop frequently. She does get SOB at times but denies any chest pain. She saw Cardiology in August.    Review of Systems  Constitutional: Positive for fatigue.  Respiratory: Positive for shortness of breath. Negative for cough, chest tightness and wheezing.   Cardiovascular: Negative.   Neurological: Positive for dizziness and weakness. Negative for tremors, seizures, syncope, facial asymmetry, speech difficulty, light-headedness, numbness and headaches.       Objective:   Physical Exam  Constitutional: She is oriented to person, place, and time. She appears well-developed and well-nourished.  Neck: No thyromegaly present.  Cardiovascular: Regular rhythm, normal heart sounds and intact distal pulses.  Exam reveals no gallop and no friction rub.   No murmur heard.      Rapid rate   Pulmonary/Chest: Effort normal. No respiratory distress. She has no wheezes.       Slight crackles at the right base   Musculoskeletal: She exhibits no edema.  Lymphadenopathy:    She has no cervical adenopathy.  Neurological: She is alert and oriented to person, place, and time. She has normal reflexes. No cranial nerve deficit. She exhibits normal muscle tone. Coordination normal.            Assessment & Plan:  She complains of generalized weakness and easy fatiguebility of the muscles. We will get labs to day to check her potassium and to check for any worsening CHF. Refer to Neurology to rule out any nerve diseases.

## 2012-05-06 LAB — CBC WITH DIFFERENTIAL/PLATELET
Basophils Absolute: 0 10*3/uL (ref 0.0–0.1)
Basophils Relative: 0.6 % (ref 0.0–3.0)
Eosinophils Absolute: 0.2 10*3/uL (ref 0.0–0.7)
Lymphocytes Relative: 26.6 % (ref 12.0–46.0)
MCHC: 31.8 g/dL (ref 30.0–36.0)
Neutrophils Relative %: 58.7 % (ref 43.0–77.0)
RBC: 5.67 Mil/uL — ABNORMAL HIGH (ref 3.87–5.11)
RDW: 17.5 % — ABNORMAL HIGH (ref 11.5–14.6)

## 2012-05-06 LAB — TSH: TSH: 1.06 u[IU]/mL (ref 0.35–5.50)

## 2012-05-06 LAB — BASIC METABOLIC PANEL
GFR: 55.63 mL/min — ABNORMAL LOW (ref >60.00)
Potassium: 3.4 mEq/L — ABNORMAL LOW (ref 3.5–5.1)
Sodium: 140 mEq/L (ref 135–145)

## 2012-05-06 LAB — BRAIN NATRIURETIC PEPTIDE: Pro B Natriuretic peptide (BNP): 27 pg/mL (ref 0.0–100.0)

## 2012-05-06 NOTE — Addendum Note (Signed)
Addended by: Aniceto Boss A on: 05/06/2012 12:33 PM   Modules accepted: Orders

## 2012-05-07 ENCOUNTER — Encounter: Payer: Self-pay | Admitting: Family Medicine

## 2012-05-07 NOTE — Progress Notes (Signed)
Quick Note:  I left a voice message with results and put a copy in the mail. ______

## 2012-06-23 ENCOUNTER — Telehealth: Payer: Self-pay | Admitting: Family Medicine

## 2012-06-23 NOTE — Telephone Encounter (Signed)
Refill request for Hydrocodone/Acetaminophen 10-660 mg, please change strength and last here on 05/05/12.

## 2012-06-24 ENCOUNTER — Telehealth: Payer: Self-pay | Admitting: Family Medicine

## 2012-06-24 NOTE — Telephone Encounter (Signed)
Opened in error

## 2012-06-24 NOTE — Telephone Encounter (Signed)
Please call to cancel the order for the 10/660. Instead call in Vicodin 10/325 to take bid #60 with 5 rf

## 2012-06-24 NOTE — Telephone Encounter (Signed)
Patient called stating that she need this sent ASAP. Please assist.

## 2012-06-25 MED ORDER — HYDROCODONE-ACETAMINOPHEN 10-325 MG PO TABS: 1.0000 | ORAL_TABLET | Freq: Four times a day (QID) | ORAL | Status: DC | PRN

## 2012-06-25 NOTE — Telephone Encounter (Signed)
I called in script 

## 2012-07-09 ENCOUNTER — Ambulatory Visit: Payer: Medicare Other | Admitting: Family Medicine

## 2012-07-12 ENCOUNTER — Telehealth: Payer: Self-pay | Admitting: Family Medicine

## 2012-07-12 NOTE — Telephone Encounter (Signed)
Pt needs refill of temazepam (RESTORIL) 30 MG.   Pharm: [facility]

## 2012-07-12 NOTE — Telephone Encounter (Signed)
Call in #30 with 5 rf 

## 2012-07-13 ENCOUNTER — Ambulatory Visit: Payer: Medicare Other | Admitting: Family Medicine

## 2012-07-13 MED ORDER — TEMAZEPAM 30 MG PO CAPS: 30.0000 mg | ORAL_CAPSULE | Freq: Every evening | ORAL | Status: DC | PRN

## 2012-07-13 NOTE — Telephone Encounter (Signed)
Rx called in [facility].

## 2012-07-27 ENCOUNTER — Encounter: Payer: Self-pay | Admitting: Family Medicine

## 2012-07-27 ENCOUNTER — Ambulatory Visit (INDEPENDENT_AMBULATORY_CARE_PROVIDER_SITE_OTHER): Payer: Medicare Other | Admitting: Family Medicine

## 2012-07-27 VITALS — BP 140/70 | HR 101 | Temp 98.3°F | Wt 188.0 lb

## 2012-07-27 DIAGNOSIS — G4700 Insomnia, unspecified: Secondary | ICD-10-CM

## 2012-07-27 DIAGNOSIS — F329 Major depressive disorder, single episode, unspecified: Secondary | ICD-10-CM

## 2012-07-27 DIAGNOSIS — IMO0001 Reserved for inherently not codable concepts without codable children: Secondary | ICD-10-CM

## 2012-07-27 MED ORDER — TEMAZEPAM 30 MG PO CAPS: 60.0000 mg | ORAL_CAPSULE | Freq: Every evening | ORAL | Status: DC | PRN

## 2012-07-27 NOTE — Progress Notes (Signed)
  Subjective:    Patient ID: Sydney Phillips, female    DOB: 08/25/1935, 77 y.o.   MRN: [id number]  HPI Here to discuss how to come off Cymbalta. She feels better than she has for years. Her moods are brighter and she feels more energy. Still has trouble sleeping.    Review of Systems  Constitutional: Negative.   Neurological: Negative.   Psychiatric/Behavioral: Positive for sleep disturbance. Negative for confusion, dysphoric mood, decreased concentration and agitation. The patient is nervous/anxious.        Objective:   Physical Exam  Constitutional: She is oriented to person, place, and time. She appears well-developed and well-nourished.  Neurological: She is alert and oriented to person, place, and time.  Psychiatric: She has a normal mood and affect. Her behavior is normal. Thought content normal.          Assessment & Plan:  We will taper off Cymbalta over the next month. Take 60 mg once a day for 2 weeks, then take this every other day for 2 weeks, then stop it. Increase temazepam to 2 capsules qhs.

## 2012-08-17 ENCOUNTER — Telehealth: Payer: Self-pay | Admitting: Family Medicine

## 2012-08-17 MED ORDER — DOXYCYCLINE HYCLATE 100 MG PO TABS: 100.0000 mg | ORAL_TABLET | Freq: Two times a day (BID) | ORAL | Status: DC

## 2012-08-17 NOTE — Telephone Encounter (Signed)
I sent script e-scribe and left voice message for pt 

## 2012-08-17 NOTE — Telephone Encounter (Signed)
Patient Information:  Caller Name: Sabirin  Phone: [phone]  Patient: Sydney Phillips, Sydney Phillips  Gender: Female  DOB: 04-02-1936  Age: 77 Years  PCP: Gershon Crane [facility])  Office Follow Up:  Does the office need to follow up with this patient?: Yes  Instructions For The Office: please advice pt if she requires office visit  RN Note:  Pt states that she would like for abx to be called in.  Pt advised that she needs to be seen in office.  Pt has tranportation issues.  she can only be seen in late afternoon.  Symptoms  Reason For Call & Symptoms: Onset 2/28 nasal drainage, afebrile, coughing, sore throat  Reviewed Health History In EMR: Yes  Reviewed Medications In EMR: Yes  Reviewed Allergies In EMR: Yes  Reviewed Surgeries / Procedures: Yes  Date of Onset of Symptoms: 08/13/2012  Guideline(s) Used:  Colds  Cough  Disposition Per Guideline:   See Today in Office  Reason For Disposition Reached:   Known COPD or other severe lung disease (i.e., bronchiectasis, cystic fibrosis, lung surgery) and worsening symptoms (i.e., increased sputum purulence or amount, increased breathing difficulty)  Advice Given:  Call Back If:  You become worse.  Patient Refused Recommendation:  Patient Requests Prescription  pt states that it is too cold for her to come out and she has transportation issues.

## 2012-08-17 NOTE — Telephone Encounter (Signed)
Call in Doxycycline 100 mg bid for 10 days  

## 2012-08-17 NOTE — Telephone Encounter (Signed)
Spoke with patient and offered an appointment tomorrow afternoon.  She states she "does not come out in this kind of weather".  She has not tried OTC because of the medication she is taking.  Please advise.

## 2012-09-16 LAB — ICD DEVICE OBSERVATION

## 2012-10-12 ENCOUNTER — Other Ambulatory Visit: Payer: Self-pay | Admitting: Family Medicine

## 2012-10-12 NOTE — Telephone Encounter (Signed)
Call in #120 with 5 rf 

## 2012-10-26 ENCOUNTER — Encounter: Payer: Self-pay | Admitting: Cardiovascular Disease

## 2012-10-27 ENCOUNTER — Ambulatory Visit: Payer: Medicare Other | Admitting: Cardiovascular Disease

## 2012-10-27 ENCOUNTER — Encounter: Payer: Self-pay | Admitting: Family Medicine

## 2012-10-27 ENCOUNTER — Ambulatory Visit (INDEPENDENT_AMBULATORY_CARE_PROVIDER_SITE_OTHER): Payer: Medicare Other | Admitting: Family Medicine

## 2012-10-27 VITALS — BP 144/72 | HR 74 | Temp 98.5°F | Wt 181.0 lb

## 2012-10-27 DIAGNOSIS — M48061 Spinal stenosis, lumbar region without neurogenic claudication: Secondary | ICD-10-CM

## 2012-10-27 DIAGNOSIS — K5900 Constipation, unspecified: Secondary | ICD-10-CM

## 2012-10-27 MED ORDER — CLOPIDOGREL BISULFATE 75 MG PO TABS: 75.0000 mg | ORAL_TABLET | Freq: Every day | ORAL | Status: DC

## 2012-10-27 NOTE — Progress Notes (Signed)
  Subjective:    Patient ID: Sydney Phillips, female    DOB: 1936-05-11, 77 y.o.   MRN: [id number]  HPI Here for several issues. First she has been having increasing weakness of the legs, to where she feels like she may fall. There is no pain in the legs and only mild pain in the lower back. She uses a walker all the time now. She last saw Dr. Jeral Fruit in 2011 when he did a CT myelogram of her lower spine. This showed spinal stenosis at multiple levels, but he did not recommend surgery at that time. Also she has been very constipated for 6 months. She averages one BM every 8-9 days, and she only has a BM if she takes a laxative like Dulcolax. She drinks plenty of water. She has tried Miralax sporadically. She denies abdominal pain or nausea. Her last colonoscopy was done in 2003 with Dr. Sabino Gasser.    Review of Systems  Respiratory: Negative.   Cardiovascular: Negative.   Gastrointestinal: Positive for constipation. Negative for nausea, vomiting, abdominal pain, diarrhea, blood in stool, abdominal distention and rectal pain.  Neurological: Positive for weakness. Negative for dizziness, tremors, seizures, syncope, speech difficulty, light-headedness, numbness and headaches.       Objective:   Physical Exam  Constitutional: She appears well-developed and well-nourished. No distress.  Walks with her walker   Cardiovascular: Normal rate, regular rhythm, normal heart sounds and intact distal pulses.   Pulmonary/Chest: Effort normal and breath sounds normal.  Abdominal: Soft. Bowel sounds are normal. She exhibits no distension and no mass. There is no tenderness. There is no rebound and no guarding.  Musculoskeletal: She exhibits no edema.          Assessment & Plan:  For the constipation I advised her to use Miralax bid and also use Colace bid. We will have her see Dr. Virginia Rochester again, since she is overdue for a colonoscopy. As for the weak legs, I think her spinal stenosis has gotten worse. We  will have her see Dr. Jeral Fruit again.

## 2012-10-29 ENCOUNTER — Encounter: Payer: Self-pay | Admitting: Internal Medicine

## 2012-10-29 ENCOUNTER — Ambulatory Visit: Payer: Medicare Other | Admitting: Cardiovascular Disease

## 2012-11-02 ENCOUNTER — Encounter: Payer: Self-pay | Admitting: *Deleted

## 2012-11-14 ENCOUNTER — Other Ambulatory Visit: Payer: Self-pay | Admitting: Cardiovascular Disease

## 2012-11-14 DIAGNOSIS — I2589 Other forms of chronic ischemic heart disease: Secondary | ICD-10-CM

## 2012-11-14 LAB — ICD DEVICE OBSERVATION

## 2012-11-16 ENCOUNTER — Telehealth: Payer: Self-pay | Admitting: Family Medicine

## 2012-11-16 NOTE — Telephone Encounter (Signed)
Okay for Norco and samples

## 2012-11-16 NOTE — Telephone Encounter (Signed)
PT called to request a refill of her HYDROcodone-acetaminophen (NORCO) 10-325 MG per tablet, with 5 refills. She also asked if we had any cymbalta samples. Please assist.

## 2012-11-17 MED ORDER — HYDROCODONE-ACETAMINOPHEN 10-325 MG PO TABS: 1.0000 | ORAL_TABLET | Freq: Four times a day (QID) | ORAL | Status: DC | PRN

## 2012-11-17 NOTE — Telephone Encounter (Signed)
I called in script and spoke with pt, did not have any samples.

## 2012-11-18 ENCOUNTER — Other Ambulatory Visit: Payer: Self-pay | Admitting: Family Medicine

## 2012-11-19 NOTE — Telephone Encounter (Signed)
Pt needs refill on cymbalta call [facility]

## 2012-11-19 NOTE — Telephone Encounter (Signed)
Refill for one year 

## 2012-11-22 ENCOUNTER — Telehealth: Payer: Self-pay | Admitting: Family Medicine

## 2012-11-22 MED ORDER — FUROSEMIDE 80 MG PO TABS: 80.0000 mg | ORAL_TABLET | Freq: Two times a day (BID) | ORAL | Status: DC

## 2012-11-22 NOTE — Telephone Encounter (Signed)
Pt called and stated that she needed a refill of her furosemide (LASIX) 80 MG tablet, sent to [facility] in [place]. Please assist.

## 2012-11-22 NOTE — Telephone Encounter (Signed)
I sent script e-scribe. 

## 2012-11-25 ENCOUNTER — Encounter: Payer: Self-pay | Admitting: *Deleted

## 2012-11-25 LAB — REMOTE ICD DEVICE
AL AMPLITUDE: 4.4 mv
AL THRESHOLD: 0.5 V
BATTERY VOLTAGE: 3.16 V
FVT: 0
LV LEAD THRESHOLD: 2.25 V
PACEART VT: 0
RV LEAD THRESHOLD: 0.875 V
TZON-0003SLOWVT: 359.2 ms

## 2012-11-30 ENCOUNTER — Encounter: Payer: Self-pay | Admitting: Internal Medicine

## 2012-12-01 ENCOUNTER — Ambulatory Visit: Payer: Medicare Other | Admitting: Cardiovascular Disease

## 2012-12-03 ENCOUNTER — Telehealth: Payer: Self-pay | Admitting: Gastroenterology

## 2012-12-03 ENCOUNTER — Encounter: Payer: Self-pay | Admitting: Internal Medicine

## 2012-12-03 ENCOUNTER — Ambulatory Visit (INDEPENDENT_AMBULATORY_CARE_PROVIDER_SITE_OTHER): Payer: Medicare Other | Admitting: Internal Medicine

## 2012-12-03 VITALS — BP 120/60 | HR 72 | Ht 65.5 in | Wt 178.0 lb

## 2012-12-03 DIAGNOSIS — K5900 Constipation, unspecified: Secondary | ICD-10-CM

## 2012-12-03 DIAGNOSIS — R159 Full incontinence of feces: Secondary | ICD-10-CM

## 2012-12-03 DIAGNOSIS — K5909 Other constipation: Secondary | ICD-10-CM

## 2012-12-03 DIAGNOSIS — Z1211 Encounter for screening for malignant neoplasm of colon: Secondary | ICD-10-CM

## 2012-12-03 DIAGNOSIS — R3911 Hesitancy of micturition: Secondary | ICD-10-CM

## 2012-12-03 DIAGNOSIS — K625 Hemorrhage of anus and rectum: Secondary | ICD-10-CM

## 2012-12-03 MED ORDER — LUBIPROSTONE 24 MCG PO CAPS: 24.0000 ug | ORAL_CAPSULE | Freq: Two times a day (BID) | ORAL | Status: DC

## 2012-12-03 MED ORDER — PEG-KCL-NACL-NASULF-NA ASC-C 100 G PO SOLR: 1.0000 | Freq: Once | ORAL | Status: DC

## 2012-12-03 NOTE — Patient Instructions (Addendum)
You have been scheduled for a colonoscopy with propofol. Please follow written instructions given to you at your visit today.  Please pick up your prep kit at the pharmacy within the next 1-3 days. If you use inhalers (even only as needed), please bring them with you on the day of your procedure. Your physician has requested that you go to www.[email or web address].com and enter the access code given to you at your visit today. This web site gives a general overview about your procedure. However, you should still follow specific instructions given to you by our office regarding your preparation for the procedure.  Discontinue Colace, and Miralax  We have sent the following medications to your pharmacy for you to pick up at your convenience: Amitiza ; daily  We will contact Dr. Gery Pray about holding Plavix and we will call you with those instructions.                                               We are excited to introduce MyChart, a new best-in-class service that provides you online access to important information in your electronic medical record. We want to make it easier for you to view your health information - all in one secure location - when and where you need it. We expect MyChart will enhance the quality of care and service we provide.  When you register for MyChart, you can:    View your test results.    Request appointments and receive appointment reminders via email.    Request medication renewals.    View your medical history, allergies, medications and immunizations.    Communicate with your physician's office through a password-protected site.    Conveniently print information such as your medication lists.  To find out if MyChart is right for you, please talk to a member of our clinical staff today. We will gladly answer your questions about this free health and wellness tool.  If you are age 75 or older and want a member of your family to have access to your record, you must  provide written consent by completing a proxy form available at our office. Please speak to our clinical staff about guidelines regarding accounts for patients younger than age 47.  As you activate your MyChart account and need any technical assistance, please call the MyChart technical support line at [phone]) [phone]) or email your question to [email or web address]. If you email your question(s), please include your name, a return phone number and the best time to reach you.  If you have non-urgent health-related questions, you can send a message to our office through MyChart at [email or web address]. If you have a medical emergency, call 911.  Thank you for using MyChart as your new health and wellness resource!   MyChart licensed from [employer],  [id number]. Patents Pending.

## 2012-12-03 NOTE — Progress Notes (Signed)
Patient ID: LOISANN ROACH, female   DOB: 05/08/1936, 77 y.o.   MRN: [id number] HPI: Mrs. Eschete is a 77 year old female with a past medical history of peripheral vascular disease status post PCI on Plavix, history of mixed cardiomyopathy with recovered EF status post ICD placement, spinal stenosis, chronic constipation, hyperlipidemia, tobacco use, GERD, hypertension, mild COPD not on home oxygen who is seen in consultation at the request of Dr. Clent Ridges for evaluation of constipation and history of colon polyp. She presents alone today. She reports she has a very long-standing history of chronic constipation. She is currently using MiraLax 17 g twice daily and Colace one to 2 times daily. She finds this not helpful in the least. Previously she was using Dulcolax tablets but had to increase the doses to 6 tablets per day. She reports when she was using 6 tablets daily she was having affect her bowel movements. Her portion go as long as 2 weeks without having a bowel movement. Occasionally she does have fecal incontinence when bending over or other Valsalva maneuvers. Occasionally she has seen bright red blood per rectum which she states is scant and usually associated with straining. Occasionally she feels lower abdominal cramping pain prior to bowel movements, worse with Dulcolax. She reports that she is severely constipated she has urinary urgency and hesitancy and difficulty starting her stream. After she has had a bowel movement she is able to urinate freely. No dysuria or hematuria. No melena. No other abdominal pain. No trouble swallowing. No significant heartburn. No early satiety. She does continue to smoke but reports only lights the cigarettes and very rarely smokes them.  She recalls a previous colonoscopy performed by Dr. Virginia Rochester is 2003. No other colonoscopy  Patient Active Problem List   Diagnosis Date Noted  . Subclavian arterial stenosis, lt, with PTA/STENT 07/31/11 08/01/2011  . S/P angioplasty  with stent, lt. subclavian 07/31/11 08/01/2011  . Cardiomyopathy, mixed, EF responded to BiV, last EF 55% by 2D 2/12 07/28/2011  . Biventricular ICD (implantable cardiac defibrillator) in place 07/28/2011  . PVD, known severe, (previuosly asymptomatic) LSCA disease, now with "high grade" RICA disease. 07/28/2011  . Bronchitis, recent flare 07/28/2011  . Syncope,possible related to subclavian steal syndrome 07/27/2011  . Vertigo 07/27/2011  . Hypokalemia 07/27/2011  . SPINAL STENOSIS 01/28/2010  . CONSTIPATION 01/14/2010  . BACK PAIN, LUMBAR 01/01/2010  . INSOMNIA 01/23/2009  . ALLERGIC RHINITIS 11/10/2008  . CAD, 75% LAD, 75% RCA 2009, low risk Myoview 2/12 06/21/2008  . HIP PAIN, BILATERAL 06/21/2008  . FIBROMYALGIA 11/26/2007  . HYPERLIPIDEMIA, Hx of Niacin and statin intol 07/28/2007  . ACUTE SINUSITIS, UNSPECIFIED 07/28/2007  . WEIGHT GAIN 06/04/2007  . DYSPNEA 06/04/2007  . COPD 03/02/2007  . DEPRESSION 02/25/2007  . HTN, Nl renal arteries 3/12 02/25/2007  . GERD 02/25/2007  . COLONIC POLYPS, HX OF 02/25/2007    Past Surgical History  Procedure Laterality Date  . Appendectomy    . Tonsillectomy    . Tubal ligation    . Cardiac defibrillator placement  05/2008    By Dr Fredrich Birks, [employer] CANNOT HAVE MRI's  . Abdominal hysterectomy    . Back surgery  2012  . Orif elbow fracture  08/16/2011    Procedure: OPEN REDUCTION INTERNAL FIXATION (ORIF) ELBOW/OLECRANON FRACTURE;  Surgeon: Marlowe Shores, MD;  Location: [facility] OR;  Service: Orthopedics;  Laterality: Left;  . Cardiac catheterization  12/01/2007    By Dr. Elsie Lincoln, left heart cath,   . Coronary angioplasty    .  Subclavian stent placement Left 07/31/2011    7x18 [employer], balloon, with reduction of 90% ostial left subclavian artery stenosis to 0% with residual excellent flow    Current Outpatient Prescriptions  Medication Sig Dispense Refill  . ALPRAZolam (XANAX) 1 MG tablet TAKE ONE TABLET BY MOUTH 4 TIMES DAILY  120  tablet  5  . aspirin 325 MG tablet Take 325 mg by mouth daily.      . clopidogrel (PLAVIX) 75 MG tablet Take 1 tablet (75 mg total) by mouth daily.  30 tablet  11  . CYMBALTA 60 MG capsule TAKE ONE CAPSULE BY MOUTH TWICE DAILY  60 capsule  11  . furosemide (LASIX) 80 MG tablet Take 1 tablet (80 mg total) by mouth 2 (two) times daily.  180 tablet  1  . HYDROcodone-acetaminophen (NORCO) 10-325 MG per tablet Take 1 tablet by mouth every 6 (six) hours as needed for pain.  60 tablet  5  . lubiprostone (AMITIZA) 24 MCG capsule Take 1 capsule (24 mcg total) by mouth 2 (two) times daily with a meal.  30 capsule  3  . peg 3350 powder (MOVIPREP) 100 G SOLR Take 1 kit (100 g total) by mouth once.  1 kit  0   No current facility-administered medications for this visit.    Allergies  Allergen Reactions  . Codeine Itching  . Darvon Itching  . Meloxicam Other (See Comments)    Unknown    . Propoxyphene-Acetaminophen Itching  . Rofecoxib Other (See Comments)    Unknown    . Azithromycin Rash  . Erythromycin Rash  . Penicillins Rash  . Sulfa Antibiotics Rash    Family History  Problem Relation Age of Onset  . Colon cancer    . Hyperlipidemia    . Anesthesia problems Neg Hx   . Hypotension Neg Hx   . Malignant hyperthermia Neg Hx   . Pseudochol deficiency Neg Hx     History  Substance Use Topics  . Smoking status: Current Every Day Smoker -- 0.50 packs/day for 60 years    Types: Cigarettes  . Smokeless tobacco: Never Used     Comment: or less  . Alcohol Use: Yes     Comment: once a month    ROS: As per history of present illness, otherwise negative  BP 120/60  Pulse 72  Ht 5' 5.5" (1.664 m)  Wt 178 lb (80.74 kg)  BMI 29.16 kg/m2 Constitutional: Well-developed and well-nourished. No distress. Smells of tobacco HEENT: Normocephalic and atraumatic. Oropharynx is clear and moist. No oropharyngeal exudate. Conjunctivae are normal.  No scleral icterus. Neck: Neck supple. Trachea  midline. Cardiovascular: Normal rate, regular rhythm and intact distal pulses.  Pulmonary/chest: Effort normal and breath sounds normal. No wheezing, rales or rhonchi. Abdominal: Soft, nontender, nondistended. Bowel sounds active throughout.  Extremities: no clubbing, cyanosis, or edema Neurological: Alert and oriented to person place and time. Skin: Skin is warm and dry. No rashes noted. Psychiatric: Normal mood and affect. Behavior is normal.  RELEVANT LABS AND IMAGING: CBC    Component Value Date/Time   WBC 7.2 05/05/2012 1651   RBC 5.67* 05/05/2012 1651   HGB 14.8 05/05/2012 1651   HCT 46.0 05/05/2012 1651   PLT 341.0 05/05/2012 1651   MCV 81.4 05/05/2012 1651   MCH 28.3 08/08/2011 1305   MCHC 31.8 05/05/2012 1651   RDW 17.5* 05/05/2012 1651   LYMPHSABS 1.9 05/05/2012 1651   MONOABS 0.8 05/05/2012 1651   EOSABS 0.2 05/05/2012 1651  BASOSABS 0.0 05/05/2012 1651    CMP     Component Value Date/Time   NA 140 05/05/2012 1651   K 3.4* 05/05/2012 1651   CL 97 05/05/2012 1651   CO2 33* 05/05/2012 1651   GLUCOSE 120* 05/05/2012 1651   BUN 13 05/05/2012 1651   CREATININE 1.0 05/05/2012 1651   CREATININE 0.97 12/02/2010 1659   CALCIUM 9.8 05/05/2012 1651   PROT 6.3 07/31/2011 0504   ALBUMIN 3.2* 07/31/2011 0504   AST 14 07/31/2011 0504   ALT 11 07/31/2011 0504   ALKPHOS 103 07/31/2011 0504   BILITOT 0.2* 07/31/2011 0504   GFRNONAA 62* 08/08/2011 1305   GFRAA 72* 08/08/2011 1305   Colonoscopy 02/17/2002 -- Dr. Early Osmond the cecum. 2 rectal polyps removed and retrieved. Otherwise normal examination. Pathology report = hyperplastic polyp EGD 02/17/2002 -- Dr. Virginia Rochester -- unremarkable EGD  ASSESSMENT/PLAN: 77 year old female with a past medical history of peripheral vascular disease status post PCI on Plavix, history of mixed cardiomyopathy with recovered EF status post ICD placement, spinal stenosis, chronic constipation, hyperlipidemia, tobacco use, GERD, hypertension, mild COPD not  on home oxygen who is seen in consultation at the request of Dr. Clent Ridges for evaluation of constipation and history of colon polyp.  1.  Chronic constipation/likely pelvic floor dysfunction/fecal incontinence -- she reports a history of vaginal birth to twins one of which was breech. I suspect she has some element of pelvic floor dysfunction which has worsened her constipation but also leading to occasional fecal incontinence. I do think she needs a repeat colonoscopy both for colorectal cancer screening, to evaluate her rectal bleeding, and intermittent fecal incontinence. Given that she has not responded to MiraLax and Colace, I have asked that she discontinue these medications. I have prescribed lubiprostone 24 mcg twice a day. We talked about the side effect of diarrhea. I would like her to let me know how she responds to this medication.  2.  CRC screening/rectal bleeding -- she is due for colorectal cancer screening. Her previous colon polyps were hyperplastic only. Her rectal bleeding and fecal incontinence is another reason to perform this examination. We discussed the test including the risks and benefits and she is agreeable to proceed. We will need to seek permission from Dr. Allyson Sabal to hold her Plavix for 5 days prior to this procedure. Assuming that Dr. Allyson Sabal is okay holding Plavix we will proceed to colonoscopy.  3.  Urinary hesitancy -- if her urinary symptoms felt improved with correction/improvement of her constipation, she should be referred to urology. We discussed this today and she would like to work on constipation first before urology referral.

## 2012-12-03 NOTE — Telephone Encounter (Signed)
12/03/2012    RE: Sydney Phillips DOB: February 28, 1936 MRN: [id number]   Dear Dr. Allyson Sabal,    We have scheduled the above patient for an endoscopic procedure. Our records show that she is on anticoagulation therapy.   Please advise as to how long the patient may come off her therapy of Plavix prior to the procedure, which is scheduled for 12/28/2012.  Please fax back/ or route the completed form to [facility] at [phone].   Sincerely,  Sydney Phillips  [facility]  Dr. Carie Caddy. Pyrtle

## 2012-12-06 ENCOUNTER — Encounter: Payer: Self-pay | Admitting: Internal Medicine

## 2012-12-07 ENCOUNTER — Telehealth: Payer: Self-pay | Admitting: Internal Medicine

## 2012-12-08 NOTE — Telephone Encounter (Signed)
Spoke to pt. Told her I have a voucher for a free moviprep I can mail her. Pt was appreciative.

## 2012-12-13 ENCOUNTER — Telehealth: Payer: Self-pay | Admitting: *Deleted

## 2012-12-13 NOTE — Telephone Encounter (Signed)
Letter for clearance for upcoming endoscopic procedure and permission to hold plavix prior was received and clearance was faxed back.

## 2012-12-14 ENCOUNTER — Telehealth: Payer: Self-pay | Admitting: Gastroenterology

## 2012-12-14 NOTE — Telephone Encounter (Signed)
lvm for pt to call me back regarding holding plavix prior to procedure

## 2012-12-14 NOTE — Telephone Encounter (Signed)
Spoke to pt. Told her Dr. Benay Spice recommendations about holding her Plavix 5 days prior to procedure. Pt verbalized understanding

## 2012-12-20 ENCOUNTER — Telehealth: Payer: Self-pay | Admitting: Family Medicine

## 2012-12-20 NOTE — Telephone Encounter (Signed)
Reata/patient  Phone [phone]  Called to ask for samples of Cymbalta 60 mg used BID.  Ran out 12/19/12.  Requests enough samples to last until next check due on 12/29/12.  Does not have the $45.00 needed to purchase the Rx.  Please call back.

## 2012-12-20 NOTE — Telephone Encounter (Signed)
I spoke with pt. Since there is a generic form now of Cymbalta, we do not get any more samples.

## 2012-12-26 DIAGNOSIS — I2589 Other forms of chronic ischemic heart disease: Secondary | ICD-10-CM

## 2012-12-26 LAB — ICD DEVICE OBSERVATION

## 2012-12-28 ENCOUNTER — Ambulatory Visit (AMBULATORY_SURGERY_CENTER): Payer: Medicare Other | Admitting: Internal Medicine

## 2012-12-28 ENCOUNTER — Encounter: Payer: Self-pay | Admitting: Internal Medicine

## 2012-12-28 VITALS — BP 137/81 | HR 69 | Temp 98.6°F | Resp 16 | Ht 65.5 in | Wt 178.0 lb

## 2012-12-28 DIAGNOSIS — D126 Benign neoplasm of colon, unspecified: Secondary | ICD-10-CM

## 2012-12-28 DIAGNOSIS — Z86010 Personal history of colonic polyps: Secondary | ICD-10-CM

## 2012-12-28 DIAGNOSIS — K625 Hemorrhage of anus and rectum: Secondary | ICD-10-CM

## 2012-12-28 DIAGNOSIS — Z1211 Encounter for screening for malignant neoplasm of colon: Secondary | ICD-10-CM

## 2012-12-28 MED ORDER — SODIUM CHLORIDE 0.9 % IV SOLN: 500.0000 mL | INTRAVENOUS | Status: DC

## 2012-12-28 NOTE — Progress Notes (Signed)
Called to room to assist during endoscopic procedure.  Patient ID and intended procedure confirmed with present staff. Received instructions for my participation in the procedure from the performing physician.  

## 2012-12-28 NOTE — Progress Notes (Signed)
1630 MAGNET PLACED OVER ICD 3 SEC FOR REMOVAL POLYP WITH CAUTERY, THEN REMOVED

## 2012-12-28 NOTE — Patient Instructions (Addendum)
CALL YOUR ICD COMPANY TONIGHT OR IN AM TO CHECK THE FUNCTION.  HOLD PLAVIX FOR 7 DAYS, January 05, 2013.  YOU MAY TAKE TAKE TOW ASPIRIN 81 MG DAILY  DR. PYRTLE'S OFFICE WILL CALL YOU WITH ANY UPDATES.       YOU HAD AN ENDOSCOPIC PROCEDURE TODAY AT THE [facility]: Refer to the procedure report that was given to you for any specific questions about what was found during the examination.  If the procedure report does not answer your questions, please call your gastroenterologist to clarify.  If you requested that your care partner not be given the details of your procedure findings, then the procedure report has been included in a sealed envelope for you to review at your convenience later.  YOU SHOULD EXPECT: Some feelings of bloating in the abdomen. Passage of more gas than usual.  Walking can help get rid of the air that was put into your GI tract during the procedure and reduce the bloating. If you had a lower endoscopy (such as a colonoscopy or flexible sigmoidoscopy) you may notice spotting of blood in your stool or on the toilet paper. If you underwent a bowel prep for your procedure, then you may not have a normal bowel movement for a few days.  DIET: Your first meal following the procedure should be a light meal and then it is ok to progress to your normal diet.  A half-sandwich or bowl of soup is an example of a good first meal.  Heavy or fried foods are harder to digest and may make you feel nauseous or bloated.  Likewise meals heavy in dairy and vegetables can cause extra gas to form and this can also increase the bloating.  Drink plenty of fluids but you should avoid alcoholic beverages for 24 hours.  ACTIVITY: Your care partner should take you home directly after the procedure.  You should plan to take it easy, moving slowly for the rest of the day.  You can resume normal activity the day after the procedure however you should NOT DRIVE or use heavy machinery for 24 hours  (because of the sedation medicines used during the test).    SYMPTOMS TO REPORT IMMEDIATELY: A gastroenterologist can be reached at any hour.  During normal business hours, 8:30 AM to 5:00 PM Monday through Friday, call [phone].  After hours and on weekends, please call the GI answering service at [phone] who will take a message and have the physician on call contact you.   Following lower endoscopy (colonoscopy or flexible sigmoidoscopy):  Excessive amounts of blood in the stool  Significant tenderness or worsening of abdominal pains  Swelling of the abdomen that is new, acute  Fever of 100F or higher  FOLLOW UP: If any biopsies were taken you will be contacted by phone or by letter within the next 1-3 weeks.  Call your gastroenterologist if you have not heard about the biopsies in 3 weeks.  Our staff will call the home number listed on your records the next business day following your procedure to check on you and address any questions or concerns that you may have at that time regarding the information given to you following your procedure. This is a courtesy call and so if there is no answer at the home number and we have not heard from you through the emergency physician on call, we will assume that you have returned to your regular daily activities without incident.  SIGNATURES/CONFIDENTIALITY: You  and/or your care partner have signed paperwork which will be entered into your electronic medical record.  These signatures attest to the fact that that the information above on your After Visit Summary has been reviewed and is understood.  Full responsibility of the confidentiality of this discharge information lies with you and/or your care-partner.

## 2012-12-28 NOTE — Progress Notes (Signed)
1605 02 92% ON RA. 99% ON 3LMIN

## 2012-12-28 NOTE — Progress Notes (Signed)
PATIENT COMPLAINS OF PAIN LEFT ELBOW. PATIENT STATING SHE FELL IN MARCH 2012 AND HAD SURGERY ON ELBOW PER SON. POSITIONED PATIENT SUPINE WITH LEFT ARM ELEVATED ON PILLOW.

## 2012-12-28 NOTE — Op Note (Signed)
[facility] [address] [place], [address]   COLONOSCOPY PROCEDURE REPORT  PATIENT: Sydney Phillips, Sydney Phillips.  MR#: [id number] BIRTHDATE: 11-17-1935 , 76  yrs. old GENDER: Female ENDOSCOPIST: Beverley Fiedler, MD REFERRED [id number] Clent Ridges, M.D. PROCEDURE DATE:  12/28/2012 PROCEDURE:   Colonoscopy with snare polypectomy, Colonoscopy with biopsy, and Submucosal injection, any substance ASA CLASS:   Class III INDICATIONS:average risk screening, Rectal Bleeding, and fecal incontinence. MEDICATIONS: MAC sedation, administered by CRNA and propofol (Diprivan) 250mg  IV  DESCRIPTION OF PROCEDURE:   After the risks benefits and alternatives of the procedure were thoroughly explained, informed consent was obtained.  A digital rectal exam revealed hemorrhoids and A digital rectal exam revealed no rectal mass.   The LB [id number]  endoscope was introduced through the anus and advanced to the cecum, which was identified by both the appendix and ileocecal valve. No adverse events experienced.   The quality of the prep was Moviprep fair  The instrument was then slowly withdrawn as the colon was fully examined.   COLON FINDINGS: There was moderate diverticulosis noted in the sigmoid colon with associated tortuosity and angulation.   A sessile polyp measuring 6 mm in size was found at the cecum.  A polypectomy was performed with a cold snare.  The resection was complete and the polyp tissue was completely retrieved.   Three sessile polyps measuring 5-9 mm in size were found in the ascending colon.  Polypectomy was performed using cold snare (2) and using hot snare (1).  All resections were complete and all polyp tissue was completely retrieved.  A sessile polyp/tumor measuring 3-4 cm in size was found at the hepatic flexure with an umbilicated center.  This lesion is not felt to be endoscopically resectable. Multiple biopsies of the lesion were performed using cold  forceps. [place] ink was used for tattooing proximally and distally to this mass lesion.    A flat polyp measuring 15 mm in size was found in the transverse colon about 5 cm distal to the mass, and thus additional tattoo was placed distal to the flat polyp so that it would be included in resection.   Three sessile polyps measuring 4-6 mm in size were found in the distal transverse colon and descending colon.  Polypectomy was performed using cold snare.  All resections were complete and all polyp tissue was completely retrieved.   Moderate melanosis was found throughout the entire examined colon.  Retroflexed views revealed internal/external hemorrhoids. The time to cecum=10 minutes 30 seconds.  Withdrawal time=23 minutes 18 seconds.  The scope was withdrawn and the procedure completed.  COMPLICATIONS: There were no complications.  ENDOSCOPIC IMPRESSION: 1.   There was moderate diverticulosis noted in the sigmoid colon 2.   Sessile polyp/mass measuring 3-4 cm in size was found at the hepatic flexure; multiple biopsies of the lesion were performed; tattoo placed. 3.   Sessile polyp measuring 6 mm in size was found at the cecum; polypectomy was performed with a cold snare 4.   Three sessile polyps measuring 5-9 mm in size were found in the ascending colon; Polypectomy was performed using cold snare and using hot snare 5.   Flat polyp measuring 15 mm in size was found in the transverse colon, not resected due to proximity to larger polyp/mass near by, most distal tattoo placed distal to this polyp. 6.   Three sessile polyps measuring 4-6 mm in size were found in the distal transverse colon and descending colon; Polypectomy was  performed using cold snare 7.   Moderate melanosis was found throughout the entire examined colon 8.   Small internal and external hemorrhoids  RECOMMENDATIONS: 1.  Hold aspirin, aspirin products, and anti-inflammatory medication for 2 weeks. 2.  Await biopsy  results 3.  High fiber diet 4.  Surgical referral to Dr.  Romie Levee, [facility], for consideration of segmental vs.  right hemicolectomy. 5.  Colonoscopy 1 year after surgical resection  eSigned:  Beverley Fiedler, MD 12/28/2012 5:10 PM   cc: The Patient and Nelwyn Salisbury, MD   PATIENT NAME:  Sydney Phillips, Sydney Phillips. MR#: [id number]

## 2012-12-28 NOTE — Progress Notes (Signed)
Patient did not experience any of the following events: a burn prior to discharge; a fall within the facility; wrong site/side/patient/procedure/implant event; or a hospital transfer or hospital admission upon discharge from the facility. (G8907) Patient did not have preoperative order for IV antibiotic SSI prophylaxis. (G8918)  

## 2012-12-29 ENCOUNTER — Telehealth: Payer: Self-pay

## 2012-12-29 NOTE — Telephone Encounter (Signed)
  Follow up Call-  Call back number 12/28/2012  Post procedure Call Back phone  # [phone]  Permission to leave phone message Yes     Patient questions:  Do you have a fever, pain , or abdominal swelling? no Pain Score  0 *  Have you tolerated food without any problems? yes  Have you been able to return to your normal activities? yes  Do you have any questions about your discharge instructions: Diet   no Medications  no Follow up visit  no  Do you have questions or concerns about your Care? no  Actions: * If pain score is 4 or above: No action needed, pain <4.

## 2012-12-30 ENCOUNTER — Other Ambulatory Visit: Payer: Self-pay | Admitting: Gastroenterology

## 2012-12-30 ENCOUNTER — Telehealth: Payer: Self-pay | Admitting: Gastroenterology

## 2012-12-30 DIAGNOSIS — R159 Full incontinence of feces: Secondary | ICD-10-CM

## 2012-12-30 DIAGNOSIS — K5909 Other constipation: Secondary | ICD-10-CM

## 2012-12-30 NOTE — Telephone Encounter (Signed)
Pt's referral info below.

## 2012-12-30 NOTE — Telephone Encounter (Signed)
Message copied by Richardo Hanks on Thu Dec 30, 2012 10:28 AM ------      Message from: Zacarias Pontes      Created: Thu Dec 30, 2012  9:50 AM       Pt has apt with Dr Clovis Pu on 7/28 arrive at 3.20pm for a 3.50pm apt..the patient will be called 1 week prior to her apt date.Marland KitchenMarland KitchenLiborio Nixon      ----- Message -----         From: Richardo Hanks, CMA         Sent: 12/30/2012   9:07 AM           To: Zacarias Pontes            Please refer pt to Dr. Romie Levee for consideration of segmental vs. Right Hemicolectomy.                  Thank you!       Rogena Deupree       ------

## 2013-01-03 ENCOUNTER — Encounter: Payer: Self-pay | Admitting: Internal Medicine

## 2013-01-04 ENCOUNTER — Telehealth: Payer: Self-pay | Admitting: *Deleted

## 2013-01-04 NOTE — Telephone Encounter (Signed)
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  This polyp/mass was only sampled and so I cannot tell her for sure until the polyp is removed entirely by surgery.  It is the type of polyp that one would want completely removed due to its precancerous nature      She has an appointment with Dr. Maisie Fus next Monday. She will need colonoscopy one year after surgical resection ------

## 2013-01-04 NOTE — Telephone Encounter (Signed)
Notes Recorded by Linna Hoff, RN on 01/04/2013 at 10:17 AM Informed pt of path results and reminded her of her appt with Dr Maisie Fus. Pt stated she doesn't know anything about the appt. Mailed her information with Dr Maisie Fus' address and phone number. Pt stated understanding. 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 This polyp/mass was only sampled and so I cannot tell her for sure until the polyp is removed entirely by surgery. It is the type of polyp that one would want completely removed due to its precancerous nature She has an appointment with Dr. Maisie Fus next Monday. She will need colonoscopy one year

## 2013-01-10 ENCOUNTER — Ambulatory Visit (INDEPENDENT_AMBULATORY_CARE_PROVIDER_SITE_OTHER): Payer: Medicare Other | Admitting: General Surgery

## 2013-01-10 ENCOUNTER — Encounter (INDEPENDENT_AMBULATORY_CARE_PROVIDER_SITE_OTHER): Payer: Self-pay | Admitting: General Surgery

## 2013-01-10 VITALS — BP 120/68 | HR 84 | Temp 99.6°F | Resp 16 | Ht 65.5 in | Wt 175.8 lb

## 2013-01-10 DIAGNOSIS — D126 Benign neoplasm of colon, unspecified: Secondary | ICD-10-CM

## 2013-01-10 DIAGNOSIS — K635 Polyp of colon: Secondary | ICD-10-CM

## 2013-01-10 NOTE — Patient Instructions (Signed)
Colorectal Cancer  Colorectal cancer is the second most common cancer in the [place], striking 140,000 people annually and causing 60,000 deaths. That's a staggering figure when you consider the disease is potentially curable if diagnosed in the early stages. Who is at risk? Though colorectal cancer may occur at any age, more than 90% of the patients are over age 75, at which point the risk doubles every ten years. In addition to age, other high risk factors include a family history of colorectal cancer and polyps and a personal history of ulcerative colitis, colon polyps or cancer of other organs, especially of the breast or uterus. How does it start? It is generally agreed that nearly all colon and rectal cancer begins in benign polyps. These pre-malignant growths occur on the bowel wall and may eventually increase in size and become cancer. Removal of benign polyps is one aspect of preventive medicine that really works! What are the symptoms? The most common symptoms are rectal bleeding and changes in bowel habits, such as constipation or diarrhea. (These symptoms are also common in other diseases so it is important you receive a thorough examination should you experience them.) Abdominal pain and weight loss are usually late symptoms indicating possible extensive disease. Unfortunately, many polyps and early cancers fail to produce symptoms. Therefore, it is important that your routine physical includes colorectal cancer detection procedures once you reach age 50.  There are several methods for detection of colorectal cancer. These include digital rectal examination, a chemical test of the stool for blood, flexible sigmoidoscopy and colonoscopy (lighted tubular instruments used to inspect the lower bowel) and barium enema. Be sure to discuss these options with your surgeon to determine which procedure is best for you. Individuals who have a first-degree relative (parent or sibling) with colon  cancer or polyps should start their colon cancer screening at the age of 75. How is colorectal cancer treated? Colorectal cancer requires surgery in nearly all cases for complete cure. Radiation and chemotherapy are sometimes used in addition to surgery. Between 80-90% are restored to normal health if the cancer is detected and treated in the earliest stages. The cure rate drops to 50% or less when diagnosed in the later stages. Thanks to modern technology, less than 5% of all colorectal cancer patients require a colostomy, the surgical construction of an artificial excretory opening from the colon. Can colon cancer be prevented? Colon cancer is very preventable. The most important step towards preventing colon cancer is getting a screening test. Any abnormal screening test should be followed by a colonoscopy. Some individuals prefer to start with colonoscopy as a screening test. Colonoscopy provides a detailed examination of the bowel. Polyps can be identified and can often be removed during colonoscopy. Though not definitely proven, there is some evidence that diet may play a significant role in preventing colorectal cancer. As far as we know, a high fiber, low fat diet is the only dietary measure that might help prevent colorectal cancer. Finally, pay attention to changes in your bowel habits. Any new changes such as persistent constipation, diarrhea, or blood in the stool should be discussed with your physician.   Can hemorrhoids lead to colon cancer? No, but hemorrhoids may produce symptoms similar to colon polyps or cancer. Should you experience these symptoms, you should have them examined and evaluated by a physician, preferably by a colon and rectal surgeon. What is a colon and rectal surgeon? Colon and rectal surgeons are experts in the surgical and non-surgical treatment   of diseases of the colon, rectum and anus. They have completed advanced surgical training in the treatment of these  diseases as well as full general surgical training. Board-certified colon and rectal surgeons complete residencies in general surgery and colon and rectal surgery, and pass intensive examinations conducted by the American Board of Surgery and the American Board of Colon and Rectal Surgery. They are well-versed in the treatment of both benign and malignant diseases of the colon, rectum and anus and are able to perform routine screening examinations and surgically treat conditions if indicated to do so.  2012 American Society of Colon & Rectal Surgeons  

## 2013-01-10 NOTE — Progress Notes (Signed)
Chief Complaint  Patient presents with  . New Evaluation    eval for condiseration of segmental vs right helmicolectomy    HISTORY: Sydney Phillips is a 77 y.o. female who presents to the office with multiple colon polyps.  This was found on recent colonoscopy.  She denies any recent weight changes, abd pain or blood in her stool.    Past Medical History  Diagnosis Date  . COPD (chronic obstructive pulmonary disease)   . Hx of colonic polyps   . Insomnia   . Fibromyalgia   . Hyperlipemia     takes Crestor daily  . CAD (coronary artery disease)     Currently angina free, no evidence of reversible ischemia  . Vertigo     takes Meclizine prn  . Subclavian arterial stenosis, lt, with PTA/STENT 07/31/11 08/01/2011  . S/P angioplasty with stent, lt. subclavian 07/31/11 08/01/2011  . Hypertension     takes Amlodipine and Metoprolol daily  . Peripheral vascular disease   . Bronchitis   . Shortness of breath     with exertion  . Arthritis   . Joint pain   . Joint swelling   . Chronic back pain   . GERD (gastroesophageal reflux disease)     was on meds but was taken off;now watches what she eats  . Chronic constipation   . Hemorrhoids   . Urinary incontinence   . Early cataracts, bilateral   . Anxiety   . Depression     takes Cymbalta daily  . Congestive heart failure (CHF)     New York Heart Association functional class 2, diastolic dysfunction  . PAD (peripheral artery disease)     Carotid, subclavian, and lower extremity beds, currently not symptomatic  . Syncope 07/28/2011    EF - 50-55, moderate concentric hypertrophy in left ventricle  . LBBB (left bundle branch block)     Stress test 09/03/2010, EF 55  . Ischemia       Past Surgical History  Procedure Laterality Date  . Appendectomy    . Tonsillectomy    . Tubal ligation    . Cardiac defibrillator placement  05/2008    By Dr Fredrich Birks, [employer] CANNOT HAVE MRI's  . Abdominal hysterectomy    . Back surgery  2012   . Orif elbow fracture  08/16/2011    Procedure: OPEN REDUCTION INTERNAL FIXATION (ORIF) ELBOW/OLECRANON FRACTURE;  Surgeon: Marlowe Shores, MD;  Location: [facility] OR;  Service: Orthopedics;  Laterality: Left;  . Cardiac catheterization  12/01/2007    By Dr. Elsie Lincoln, left heart cath,   . Coronary angioplasty    . Subclavian stent placement Left 07/31/2011    7x18 Genesis, balloon, with reduction of 90% ostial left subclavian artery stenosis to 0% with residual excellent flow      Current Outpatient Prescriptions  Medication Sig Dispense Refill  . ALPRAZolam (XANAX) 1 MG tablet TAKE ONE TABLET BY MOUTH 4 TIMES DAILY  120 tablet  5  . aspirin 325 MG tablet Take 325 mg by mouth daily.      . clopidogrel (PLAVIX) 75 MG tablet Take 1 tablet (75 mg total) by mouth daily.  30 tablet  11  . CYMBALTA 60 MG capsule TAKE ONE CAPSULE BY MOUTH TWICE DAILY  60 capsule  11  . furosemide (LASIX) 80 MG tablet Take 1 tablet (80 mg total) by mouth 2 (two) times daily.  180 tablet  1  . HYDROcodone-acetaminophen (NORCO) 10-325 MG per tablet Take 1 tablet  by mouth every 6 (six) hours as needed for pain.  60 tablet  5  . lubiprostone (AMITIZA) 24 MCG capsule Take 1 capsule (24 mcg total) by mouth 2 (two) times daily with a meal.  30 capsule  3  . metoprolol tartrate (LOPRESSOR) 25 MG tablet        No current facility-administered medications for this visit.      Allergies  Allergen Reactions  . Codeine Itching  . Darvon Itching  . Meloxicam Other (See Comments)    Unknown    . Propoxyphene-Acetaminophen Itching  . Rofecoxib Other (See Comments)    Unknown    . Azithromycin Rash  . Erythromycin Rash  . Penicillins Rash  . Sulfa Antibiotics Rash      Family History  Problem Relation Age of Onset  . Hyperlipidemia    . Anesthesia problems Neg Hx   . Hypotension Neg Hx   . Malignant hyperthermia Neg Hx   . Pseudochol deficiency Neg Hx   . Colon cancer Maternal Grandmother   . Cancer Sister      ovarian      History   Social History  . Marital Status: Widowed    Spouse Name: N/A    Number of Children: N/A  . Years of Education: N/A   Social History Main Topics  . Smoking status: Current Every Day Smoker -- 0.50 packs/day for 60 years    Types: Cigarettes  . Smokeless tobacco: Never Used     Comment: or less  . Alcohol Use: Yes     Comment: once a month  . Drug Use: No  . Sexually Active: No   Other Topics Concern  . None   Social History Narrative   Ok to share information with medical POA, Son Hildred Priest       REVIEW OF SYSTEMS - PERTINENT POSITIVES ONLY: Review of Systems - General ROS: negative for - chills or fever Hematological and Lymphatic ROS: negative for - bleeding problems or blood clots Respiratory ROS: no cough, shortness of breath, or wheezing Cardiovascular ROS: no chest pain or dyspnea on exertion Gastrointestinal ROS: no abdominal pain, change in bowel habits, or black or bloody stools  EXAM: Filed Vitals:   01/10/13 1552  BP: 120/68  Pulse: 84  Temp: 99.6 F (37.6 C)  Resp: 16     Gen:  No acute distress.  Well nourished and well groomed.   Neurological: Alert and oriented to person, place, and time. Coordination normal.  Head: Normocephalic and atraumatic.  Eyes: Conjunctivae are normal. Pupils are equal, round, and reactive to light. No scleral icterus.  Neck: Normal range of motion. Neck supple. No tracheal deviation or thyromegaly present.  No cervical lymphadenopathy. Cardiovascular: Normal rate, regular rhythm, normal heart sounds and intact distal pulses.  Exam reveals no gallop and no friction rub.  No murmur heard. Respiratory: Effort normal.  No respiratory distress. No chest wall tenderness. Breath sounds normal.  No wheezes, rales or rhonchi.  GI: Soft. Bowel sounds are normal. The abdomen is soft and nontender.  There is no rebound and no guarding.  Musculoskeletal: Normal range of motion. Extremities are nontender.   Skin: Skin is warm and dry. No rash noted. No diaphoresis. No erythema. No pallor. No clubbing, cyanosis, or edema.   Psychiatric: Normal mood and affect. Behavior is normal. Judgment and thought content normal.      ASSESSMENT AND PLAN: Sydney Phillips is a 77 y.o. F with multiple medical conditions who  presents to the office with 2 unresected and tattooed colon polyps at her hepatic flexure and 5cm distal to that.  We discussed the risks of surgery, which for her, appear to be significant due to her cardiac disease.  Her biopsies showed a TVA with no HGD.  I am concerned that the hepatic flexure polyp may be harboring some cancer, given its size and appearance.  I would like to obtain a CT scan of her abdomen and a CEA level for pre-op work-up.  I will have her see her cardiologist for risk stratification.  We will also get recommendations on her coming off her Plavix and Aspirin.  If this seems to be a feasible option, then we will proceed with surgical resection.  If not, then we will need to talk about attempted endoscopic resection with either me or Dr Rhea Belton.  I think that ideally, she would benefit from surgery the most, as it may also help her constipation issues a bit, but if a complication arose from surgery, it could be life threatening to her.    The surgery and anatomy were described to the patient as well as the risks of surgery and the possible complications.  These include: Bleeding, infection and possible wound complications such as hernia, damage to adjacent structures, leak of surgical connections, which can lead to other surgeries and possibly an ostomy (5-7%), possible need for other procedures, such as abscess drains in radiology, possible prolonged hospital stay, possible diarrhea from removal of part of the colon, possible constipation from narcotics, prolonged fatigue/weakness or appetite loss, possible early recurrence of cancer, possible complications of their medical  problems such as heart disease or arrhythmias or lung problems, death (less than 1%). I believe the patient understands and wishes to proceed with the surgery.    Vanita Panda, MD Colon and Rectal Surgery / General Surgery [facility]      Visit Diagnoses: 1. Colon polyps     Primary Care Physician: Nelwyn Salisbury, MD

## 2013-01-11 ENCOUNTER — Telehealth (INDEPENDENT_AMBULATORY_CARE_PROVIDER_SITE_OTHER): Payer: Self-pay | Admitting: General Surgery

## 2013-01-11 ENCOUNTER — Telehealth (INDEPENDENT_AMBULATORY_CARE_PROVIDER_SITE_OTHER): Payer: Self-pay

## 2013-01-11 ENCOUNTER — Encounter (INDEPENDENT_AMBULATORY_CARE_PROVIDER_SITE_OTHER): Payer: Self-pay

## 2013-01-11 LAB — BUN: BUN: 13 mg/dL (ref 6–23)

## 2013-01-11 LAB — CREATININE, SERUM: Creat: 1.12 mg/dL — ABNORMAL HIGH (ref 0.50–1.10)

## 2013-01-11 NOTE — Telephone Encounter (Signed)
LMOM for patient to call back and ask for triage. To make her aware her CT abdomen and pelvis is set up at [facility] - [address] [place] on Friday 01/14/2013 to arrive at 3:45pm for 4:00pm appt. She can have no solid foods after 12:00pm and needs to drink her contrast at 2:00pm and 3:00pm. Awaiting call back.

## 2013-01-11 NOTE — Telephone Encounter (Signed)
Patient is aware of time /date/location  of CT,NPOafter MN,drink times for contrast

## 2013-01-14 ENCOUNTER — Ambulatory Visit
Admission: RE | Admit: 2013-01-14 | Discharge: 2013-01-14 | Disposition: A | Discharge status: Home / Self Care | Payer: Medicare Other | Source: Ambulatory Visit | Attending: General Surgery | Admitting: General Surgery

## 2013-01-14 DIAGNOSIS — K635 Polyp of colon: Secondary | ICD-10-CM

## 2013-01-14 MED ORDER — IOHEXOL 300 MG/ML  SOLN
100.0000 mL | Freq: Once | INTRAMUSCULAR | Status: AC | PRN
  Administered 2013-01-14: 100 mL via INTRAVENOUS

## 2013-01-17 ENCOUNTER — Encounter: Payer: Self-pay | Admitting: Family Medicine

## 2013-01-17 ENCOUNTER — Ambulatory Visit (INDEPENDENT_AMBULATORY_CARE_PROVIDER_SITE_OTHER): Payer: Medicare Other | Admitting: Family Medicine

## 2013-01-17 ENCOUNTER — Telehealth: Payer: Self-pay | Admitting: *Deleted

## 2013-01-17 VITALS — BP 160/80 | HR 81 | Temp 98.1°F | Wt 182.0 lb

## 2013-01-17 DIAGNOSIS — J019 Acute sinusitis, unspecified: Secondary | ICD-10-CM

## 2013-01-17 DIAGNOSIS — I255 Ischemic cardiomyopathy: Secondary | ICD-10-CM

## 2013-01-17 DIAGNOSIS — Z01818 Encounter for other preprocedural examination: Secondary | ICD-10-CM

## 2013-01-17 MED ORDER — HYDROCODONE-HOMATROPINE 5-1.5 MG/5ML PO SYRP: 5.0000 mL | ORAL_SOLUTION | ORAL | Status: DC | PRN

## 2013-01-17 MED ORDER — DOXYCYCLINE HYCLATE 100 MG PO CAPS: 100.0000 mg | ORAL_CAPSULE | Freq: Two times a day (BID) | ORAL | Status: AC

## 2013-01-17 NOTE — Telephone Encounter (Signed)
Fax received from [facility] asking for clearance for hemicolectomy under general anesthesia.  Dr Allyson Sabal reviewed the chart and wants Sydney Phillips to have a lexiscan myoview as pre op clearance.    I spoke with patient and she is agreeable to proceed with testing.

## 2013-01-17 NOTE — Progress Notes (Signed)
  Subjective:    Patient ID: Sydney Phillips, female    DOB: November 17, 1935, 77 y.o.   MRN: [id number]  HPI Here for one week of sinus pressure, PND, and coughing up green sputum. No fever.    Review of Systems  Constitutional: Negative.   HENT: Positive for congestion, postnasal drip and sinus pressure.   Eyes: Negative.   Respiratory: Positive for cough.        Objective:   Physical Exam  Constitutional: She appears well-developed and well-nourished.  HENT:  Right Ear: External ear normal.  Left Ear: External ear normal.  Nose: Nose normal.  Mouth/Throat: Oropharynx is clear and moist.  Eyes: Conjunctivae are normal.  Neck: No thyromegaly present.  Pulmonary/Chest: Effort normal and breath sounds normal.  Lymphadenopathy:    She has no cervical adenopathy.          Assessment & Plan:  Add Mucinex.

## 2013-01-18 ENCOUNTER — Telehealth (HOSPITAL_COMMUNITY): Payer: Self-pay | Admitting: Cardiovascular Disease

## 2013-01-20 ENCOUNTER — Telehealth: Payer: Self-pay | Admitting: Internal Medicine

## 2013-01-20 NOTE — Telephone Encounter (Signed)
Pt reports she owes Dr berry $500 and they will not schedule her Avelino Leeds until she makes a payment.

## 2013-01-21 NOTE — Telephone Encounter (Signed)
lmom for pt that I have sent Dr Maisie Fus a note. I will call her with her response.

## 2013-01-24 ENCOUNTER — Encounter: Payer: Self-pay | Admitting: *Deleted

## 2013-01-24 ENCOUNTER — Telehealth (INDEPENDENT_AMBULATORY_CARE_PROVIDER_SITE_OTHER): Payer: Self-pay

## 2013-01-24 DIAGNOSIS — K635 Polyp of colon: Secondary | ICD-10-CM

## 2013-01-24 NOTE — Telephone Encounter (Signed)
I left a message for Sydney Phillips to call me.  I wanted to know if indeed Dr Allyson Sabal refused to see her due to the bill.  She needs cardiac clearance for colon surgery.

## 2013-01-24 NOTE — Telephone Encounter (Signed)
Sydney Phillips returned my call and she states right now they can't see her.  The pt is aware of her situation there in the office.  Sydney Phillips will talk to Dr Allyson Sabal on Wednesday when he returns to see if he will authorize seeing her.  Dr Royann Shivers is in the same office and she said it would be the same situation for him to see her.  She will call me back after she talks to Dr Allyson Sabal.

## 2013-01-24 NOTE — Telephone Encounter (Signed)
Message copied by Ivory Broad on Mon Jan 24, 2013  9:28 AM ------      Message from: [place], Broadus John.      Created: Mon Jan 24, 2013  7:44 AM       My notes indicate that Dr Royann Shivers is taking care of this patient.  Either way, please call the office and confirm that an MD has refused this patient and not just the office staff.                   AT      ----- Message -----         From: Linna Hoff, RN         Sent: 01/21/2013   4:39 PM           To: Romie Levee, MD            This lady informed me yesterday that the cardiologist, Dr Allyson Sabal, his ofc has refused to see her d/t an overdue bill; would you like for [place] or you to refer her to [facility]? Thanks, Graciella Freer, RN for Dr Vonna Kotyk Pyrtle [phone]       ------

## 2013-01-25 NOTE — Telephone Encounter (Signed)
Per note to Dr Maisie Fus, she will have her staff look at cardiac referral. lmom for pt to call back.

## 2013-01-25 NOTE — Telephone Encounter (Signed)
Informed pt that Dr Maisie Fus' ofc will check on her appt with a cardiologist; pt stated understanding.

## 2013-01-26 NOTE — Telephone Encounter (Signed)
I received a call from [facility] and she states Dr Allyson Sabal will not see the pt.  She has not paid on her bill in over a year.  Pam will notify the pt.  I will see if Dr Maisie Fus wants to refer to [facility].

## 2013-01-26 NOTE — Telephone Encounter (Signed)
I called the pt and asked if Pam called her.  She did.  I asked the pt if she would like [place] to refer her to [facility] heart care per Dr Maisie Fus.  She is reluctant but she knows she needs her surgery taken care of.  She agreed and asked we set her up for a late afternoon appointment after 3:30 pm.  We will call her back with an appointment

## 2013-01-26 NOTE — Addendum Note (Signed)
Addended byIvory Broad on: 01/26/2013 03:01 PM   Modules accepted: Orders

## 2013-01-27 NOTE — Telephone Encounter (Signed)
Pt returned my call and I informed her of the appt information.

## 2013-01-27 NOTE — Addendum Note (Signed)
Addended byLittie Deeds on: 01/27/2013 10:19 AM   Modules accepted: Orders

## 2013-01-27 NOTE — Telephone Encounter (Signed)
Asked pt's son to return my call so that I can inform him that we set his mother up for a cardiac clearance appt at [facility] in [place] located at [address]  The appt is set for 03/02/13 with an arrival time of 9:00.

## 2013-02-01 ENCOUNTER — Telehealth (HOSPITAL_COMMUNITY): Payer: Self-pay | Admitting: Cardiovascular Disease

## 2013-02-05 ENCOUNTER — Other Ambulatory Visit: Payer: Self-pay | Admitting: Cardiovascular Disease

## 2013-02-05 DIAGNOSIS — I2589 Other forms of chronic ischemic heart disease: Secondary | ICD-10-CM

## 2013-02-05 LAB — ICD DEVICE OBSERVATION

## 2013-02-24 ENCOUNTER — Encounter: Payer: Self-pay | Admitting: *Deleted

## 2013-02-24 LAB — REMOTE ICD DEVICE
AL THRESHOLD: 0.625 V
BAMS-0001: 171 {beats}/min
BATTERY VOLTAGE: 3.14 V
LV LEAD IMPEDENCE ICD: 456 Ohm
LV LEAD THRESHOLD: 1.875 V
MODE SWITCH EPISODES: 0
RV LEAD AMPLITUDE: 15 mv
RV LEAD IMPEDENCE ICD: 950 Ohm
TZON-0003ATACH: 350.8 ms
VF: 0

## 2013-03-02 ENCOUNTER — Ambulatory Visit (INDEPENDENT_AMBULATORY_CARE_PROVIDER_SITE_OTHER): Payer: Medicare Other | Admitting: Cardiology

## 2013-03-02 ENCOUNTER — Encounter: Payer: Self-pay | Admitting: Cardiology

## 2013-03-02 VITALS — BP 138/74 | HR 71 | Ht 65.5 in | Wt 177.0 lb

## 2013-03-02 DIAGNOSIS — E785 Hyperlipidemia, unspecified: Secondary | ICD-10-CM

## 2013-03-02 DIAGNOSIS — Z01810 Encounter for preprocedural cardiovascular examination: Secondary | ICD-10-CM

## 2013-03-02 DIAGNOSIS — I2510 Atherosclerotic heart disease of native coronary artery without angina pectoris: Secondary | ICD-10-CM

## 2013-03-02 DIAGNOSIS — I428 Other cardiomyopathies: Secondary | ICD-10-CM

## 2013-03-02 DIAGNOSIS — I739 Peripheral vascular disease, unspecified: Secondary | ICD-10-CM

## 2013-03-02 DIAGNOSIS — I10 Essential (primary) hypertension: Secondary | ICD-10-CM

## 2013-03-02 DIAGNOSIS — Z95810 Presence of automatic (implantable) cardiac defibrillator: Secondary | ICD-10-CM

## 2013-03-02 MED ORDER — ATORVASTATIN CALCIUM 20 MG PO TABS: 20.0000 mg | ORAL_TABLET | Freq: Every day | ORAL | Status: DC

## 2013-03-02 NOTE — Assessment & Plan Note (Signed)
Blood pressure controlled. Continue present medications. 

## 2013-03-02 NOTE — Progress Notes (Signed)
HPI: 77 year old female for preoperative evaluation. She has been followed by Dr. Allyson Sabal previously. Patient has a history of cardiomyopathy that was felt to be mixed. Her ejection fraction in 2009 was 20%. Catheterization revealed a 75% LAD and a 75% right coronary artery. She had a biventricular ICD placed at that time. Last echocardiogram in February of 2013 showed an ejection fraction of 50-55%, grade 1 diastolic dysfunction and mild mitral regurgitation. Myoview in March of 2012 showed an ejection fraction of 55% and normal perfusion. Patient also has peripheral vascular disease. Arteriogram in February 2013 showed a 70% right internal carotid artery stenosis. There was a 30-40% left carotid stenosis. There was a 90% left subclavian. Patient had stent of her left subclavian at that time. Patient has been found to have polyps that will require surgical resection. Cardiology is asked to evaluate preoperatively. She does have dyspnea on exertion and has limited mobility because of arthritis. No orthopnea, PND, pedal edema or exertional chest pain.  Current Outpatient Prescriptions  Medication Sig Dispense Refill  . ALPRAZolam (XANAX) 1 MG tablet TAKE ONE TABLET BY MOUTH 4 TIMES DAILY  120 tablet  5  . aspirin 325 MG tablet Take 325 mg by mouth daily.      . clopidogrel (PLAVIX) 75 MG tablet Take 1 tablet (75 mg total) by mouth daily.  30 tablet  11  . CYMBALTA 60 MG capsule TAKE ONE CAPSULE BY MOUTH TWICE DAILY  60 capsule  11  . furosemide (LASIX) 80 MG tablet Take 1 tablet (80 mg total) by mouth 2 (two) times daily.  180 tablet  1  . HYDROcodone-acetaminophen (NORCO) 10-325 MG per tablet Take 1 tablet by mouth every 6 (six) hours as needed for pain.  60 tablet  5  . metoprolol tartrate (LOPRESSOR) 25 MG tablet 25 mg 2 (two) times daily.        No current facility-administered medications for this visit.    Allergies  Allergen Reactions  . Codeine Itching  . Darvon Itching  . Meloxicam Other  (See Comments)    Unknown    . Propoxyphene-Acetaminophen Itching  . Rofecoxib Other (See Comments)    Unknown    . Azithromycin Rash  . Erythromycin Rash  . Penicillins Rash  . Sulfa Antibiotics Rash    Past Medical History  Diagnosis Date  . COPD (chronic obstructive pulmonary disease)   . Hx of colonic polyps   . Insomnia   . Fibromyalgia   . Hyperlipemia     takes Crestor daily  . CAD (coronary artery disease)     Currently angina free, no evidence of reversible ischemia  . Vertigo     takes Meclizine prn  . Subclavian arterial stenosis, lt, with PTA/STENT 07/31/11 08/01/2011  . S/P angioplasty with stent, lt. subclavian 07/31/11 08/01/2011  . Hypertension     takes Amlodipine and Metoprolol daily  . Bronchitis   . Arthritis   . Chronic back pain   . GERD (gastroesophageal reflux disease)     was on meds but was taken off;now watches what she eats  . Chronic constipation   . Hemorrhoids   . Urinary incontinence   . Early cataracts, bilateral   . Anxiety   . Depression     takes Cymbalta daily  . Congestive heart failure (CHF)     New York Heart Association functional class 2, diastolic dysfunction  . PAD (peripheral artery disease)     Carotid, subclavian, and lower extremity beds, currently not symptomatic  .  Syncope 07/28/2011    EF - 50-55, moderate concentric hypertrophy in left ventricle  . LBBB (left bundle branch block)     Stress test 09/03/2010, EF 55  . Nephrolithiasis     Past Surgical History  Procedure Laterality Date  . Appendectomy    . Tonsillectomy    . Tubal ligation    . Cardiac defibrillator placement  05/2008    By Dr Fredrich Birks, [employer] CANNOT HAVE MRI's  . Abdominal hysterectomy    . Back surgery  2012  . Orif elbow fracture  08/16/2011    Procedure: OPEN REDUCTION INTERNAL FIXATION (ORIF) ELBOW/OLECRANON FRACTURE;  Surgeon: Marlowe Shores, MD;  Location: [facility] OR;  Service: Orthopedics;  Laterality: Left;  . Cardiac catheterization   12/01/2007    By Dr. Elsie Lincoln, left heart cath,   . Coronary angioplasty    . Subclavian stent placement Left 07/31/2011    7x18 Genesis, balloon, with reduction of 90% ostial left subclavian artery stenosis to 0% with residual excellent flow    History   Social History  . Marital Status: Widowed    Spouse Name: N/A    Number of Children: 3  . Years of Education: N/A   Occupational History  . Not on file.   Social History Main Topics  . Smoking status: Current Every Day Smoker -- 0.50 packs/day for 60 years    Types: Cigarettes  . Smokeless tobacco: Never Used     Comment: or less  . Alcohol Use: No  . Drug Use: No  . Sexual Activity: No   Other Topics Concern  . Not on file   Social History Narrative   Ok to share information with medical POA, Son Hildred Priest    Family History  Problem Relation Age of Onset  . Hyperlipidemia    . Anesthesia problems Neg Hx   . Hypotension Neg Hx   . Malignant hyperthermia Neg Hx   . Pseudochol deficiency Neg Hx   . Colon cancer Maternal Grandmother   . Cancer Sister     ovarian  . CAD Father     ROS: arthralgias but no fevers or chills, productive cough, hemoptysis, dysphasia, odynophagia, melena, hematochezia, dysuria, hematuria, rash, seizure activity, orthopnea, PND, pedal edema, claudication. Remaining systems are negative.  Physical Exam:   Blood pressure 138/74, pulse 71, height 5' 5.5" (1.664 m), weight 177 lb (80.287 kg).  General:  Well developed/well nourished in NAD Skin warm/dry Patient not depressed No peripheral clubbing Back-normal HEENT-normal/normal eyelids Neck supple/normal carotid upstroke bilaterally; bilateral bruits; no JVD; no thyromegaly chest - CTA/ normal expansion CV - RRR/normal S1 and S2; no rubs or gallops;  PMI nondisplaced, 2/6 systolic ejection murmur Abdomen -NT/ND, no HSM, no mass, + bowel sounds, no bruit 1+ femoral pulses, no bruits Ext-no edema, no chords, 2+ PT, diminished left  radial pulse Neuro-grossly nonfocal  ECG sinus rhythm with ventricular pacing.

## 2013-03-02 NOTE — Assessment & Plan Note (Addendum)
Patient has a history of coronary disease and vascular disease. She has significant dyspnea with limited mobility. This certainly may be related to COPD but we'll need risk stratification preoperatively with a Myoview. Her Plavix can be held prior to her surgery if needed.

## 2013-03-02 NOTE — Assessment & Plan Note (Signed)
Patient will most likely need to be followed by electrophysiology in the future. I will review this with Dr. Allyson Sabal.

## 2013-03-02 NOTE — Assessment & Plan Note (Signed)
Continue aspirin and statin. She has been followed by Dr. Allyson Sabal for years and I will make sure that she has a followup appointment with him.

## 2013-03-02 NOTE — Patient Instructions (Addendum)
Your physician recommends that you schedule a follow-up appointment in: 3 MONTHS WITH DR Allyson Sabal  Your physician has requested that you have a lexiscan myoview. For further information please visit [email or web address] Please follow instruction sheet, as given.   START LIPITOR 20 MG ONCE DAILY  Your physician recommends that you return for lab work in: 6 WEEKS-DO NOT EAT PRIOR TO LABS

## 2013-03-02 NOTE — Assessment & Plan Note (Signed)
Continue aspirin. Add Lipitor 20 mg daily. Check lipids, liver, and CK in 6 weeks. She apparently had some weight gain with statins previously. However I would like for her to be on a statin if tolerated.

## 2013-03-02 NOTE — Assessment & Plan Note (Signed)
Begin Lipitor as outlined under coronary artery disease.

## 2013-03-02 NOTE — Assessment & Plan Note (Signed)
Improved on most recent echo. Continue beta blocker.

## 2013-03-03 ENCOUNTER — Telehealth: Payer: Self-pay | Admitting: Internal Medicine

## 2013-03-03 DIAGNOSIS — Z86010 Personal history of colonic polyps: Secondary | ICD-10-CM

## 2013-03-03 NOTE — Telephone Encounter (Signed)
Pt reports she cannot afford to have colon surgery. She states she's also set up for a Stress Test and f/u cardiology visits that she is going to cancel them as well. Pt states she has $100 left out of her disability check each month and that will not pay for co pays when she sees a md more than once month. I asked if she is on the [facility] assistance plan and she states she makes too much money for food stamps. I could not offer any more suggestions to the pt. Please advise. Thanks.

## 2013-03-04 NOTE — Telephone Encounter (Signed)
If patient cannot afford out-of-pocket expenses at [facility] surgery, other options include referral to tertiary care/training hospital such as [facility], [facility], or [facility]. Often these facilities have less up-front out-of-pocket expenses She has a large precancerous, at a minimum, polyp in her colon which is not felt to be endoscopically resectable.  This lesion would be expected to increase in size and become malignant but over time

## 2013-03-08 ENCOUNTER — Encounter (HOSPITAL_COMMUNITY): Payer: Medicare Other

## 2013-03-09 ENCOUNTER — Other Ambulatory Visit: Payer: Self-pay | Admitting: Cardiovascular Disease

## 2013-03-09 DIAGNOSIS — I498 Other specified cardiac arrhythmias: Secondary | ICD-10-CM

## 2013-03-09 LAB — ICD DEVICE OBSERVATION

## 2013-03-09 NOTE — Telephone Encounter (Signed)
Spoke at length with pt and her son informing them we will try to get pt in at a major medical center where she should have less upfront costs. She states she's spoken with plenty of people who have had trouble with that bag and they never get used to it. She states she has no female to help her and she would not ask her son to do it. I read Dr Maisie Fus' note which states she has a 5-7% chance of needing an ostomy. She states the place isn't even cancer so why should she worry about; if God chooses her to have cancer, it's his will. I asked pt or her son to call for questions or if she changes her mind.

## 2013-03-10 NOTE — Telephone Encounter (Signed)
Pt called me back last yesterday and she has decided she wants to be referred to [facility] for surgery. Again, we went over Dr Maisie Fus' notes and that barring complications, she has a 5-7% chance of needing an ostomy. She cancelled her cardiac tests, but when I call her with an appt, she will call then back and r/s her stress test. Informed pt of appt with Dr Byrd Hesselbach at [facility] on 03/24/13 at [facility] will send pt info and a map. Pt stated understanding. Info has been sent to [phone]

## 2013-03-10 NOTE — Addendum Note (Signed)
Addended by: Florene Glen on: 03/10/2013 11:48 AM   Modules accepted: Orders

## 2013-03-12 ENCOUNTER — Encounter: Payer: Self-pay | Admitting: *Deleted

## 2013-03-12 LAB — REMOTE ICD DEVICE
BAMS-0001: 171 {beats}/min
BATTERY VOLTAGE: 3.13 V
LV LEAD THRESHOLD: 2 V
MODE SWITCH EPISODES: 0
PACEART VT: 0
RV LEAD AMPLITUDE: 20 mv
TZON-0003ATACH: 350.8 ms
TZON-0003SLOWVT: 359.2 ms
VF: 0

## 2013-03-16 ENCOUNTER — Ambulatory Visit (HOSPITAL_COMMUNITY): Payer: Medicare Other | Attending: Cardiology | Admitting: Radiology

## 2013-03-16 VITALS — BP 137/65 | Ht 65.5 in | Wt 178.0 lb

## 2013-03-16 DIAGNOSIS — R0602 Shortness of breath: Secondary | ICD-10-CM

## 2013-03-16 DIAGNOSIS — I779 Disorder of arteries and arterioles, unspecified: Secondary | ICD-10-CM | POA: Insufficient documentation

## 2013-03-16 DIAGNOSIS — R0989 Other specified symptoms and signs involving the circulatory and respiratory systems: Secondary | ICD-10-CM | POA: Insufficient documentation

## 2013-03-16 DIAGNOSIS — Z01810 Encounter for preprocedural cardiovascular examination: Secondary | ICD-10-CM | POA: Insufficient documentation

## 2013-03-16 DIAGNOSIS — I739 Peripheral vascular disease, unspecified: Secondary | ICD-10-CM | POA: Insufficient documentation

## 2013-03-16 DIAGNOSIS — E785 Hyperlipidemia, unspecified: Secondary | ICD-10-CM | POA: Insufficient documentation

## 2013-03-16 DIAGNOSIS — R0609 Other forms of dyspnea: Secondary | ICD-10-CM | POA: Insufficient documentation

## 2013-03-16 DIAGNOSIS — I2510 Atherosclerotic heart disease of native coronary artery without angina pectoris: Secondary | ICD-10-CM

## 2013-03-16 DIAGNOSIS — I10 Essential (primary) hypertension: Secondary | ICD-10-CM | POA: Insufficient documentation

## 2013-03-16 DIAGNOSIS — Z8249 Family history of ischemic heart disease and other diseases of the circulatory system: Secondary | ICD-10-CM | POA: Insufficient documentation

## 2013-03-16 DIAGNOSIS — F17200 Nicotine dependence, unspecified, uncomplicated: Secondary | ICD-10-CM | POA: Insufficient documentation

## 2013-03-16 MED ORDER — TECHNETIUM TC 99M SESTAMIBI GENERIC - CARDIOLITE
11.0000 | Freq: Once | INTRAVENOUS | Status: AC | PRN
  Administered 2013-03-16: 11 via INTRAVENOUS

## 2013-03-16 MED ORDER — REGADENOSON 0.4 MG/5ML IV SOLN
0.4000 mg | Freq: Once | INTRAVENOUS | Status: AC
  Administered 2013-03-16: 0.4 mg via INTRAVENOUS

## 2013-03-16 MED ORDER — TECHNETIUM TC 99M SESTAMIBI GENERIC - CARDIOLITE
33.0000 | Freq: Once | INTRAVENOUS | Status: AC | PRN
  Administered 2013-03-16: 33 via INTRAVENOUS

## 2013-03-16 NOTE — Progress Notes (Signed)
   [facility] SITE 3 NUCLEAR MED [address] [place], [place] [address] [phone]    Cardiology Nuclear Med Study  Sydney Phillips is a 77 y.o. female     MRN : [id number]     DOB: 11-12-1935  Procedure Date: 03/16/2013  Nuclear Med Background Indication for Stress Test:  Evaluation for Ischemia and Surgical Clearance History:  2013 Echo EF 50-55%, 2012 MPS EF 55%, Normal, 2009 ICD, Heart Catheterization Cardiac Risk Factors: Carotid Disease, Family History - CAD, Hypertension, Lipids, PVD and Smoker  Symptoms:  DOE   Nuclear Pre-Procedure Caffeine/Decaff Intake:  None NPO After: 7:30am   Lungs:  clear O2 Sat: 93% on room air. IV 0.9% NS with Angio Cath:  22g  IV Site: R Hand  IV Started by:  Bonnita Levan, RN  Chest Size (in):  42 Cup Size: C  Height: 5' 5.5" (1.664 m)  Weight:  178 lb (80.74 kg)  BMI:  Body mass index is 29.16 kg/(m^2). Tech Comments: N/A    Nuclear Med Study 1 or 2 day study: 1 day  Stress Test Type:  Lexiscan  Reading MD: Charlton Haws, MD  Order Authorizing Provider:  Olga Millers, MD  Resting Radionuclide: Technetium 14m Sestamibi  Resting Radionuclide Dose: 10.6 mCi   Stress Radionuclide:  Technetium 59m Sestamibi  Stress Radionuclide Dose: 33.0 mCi           Stress Protocol Rest HR: 83 Stress HR: 85  Rest BP: 137/65 Stress BP: 137/51  Exercise Time (min): n/a METS: n/a   Predicted Max HR: 145 bpm % Max HR: 58.62 bpm Rate Pressure Product: 78469   Dose of Adenosine (mg):  n/a Dose of Lexiscan: 0.4 mg  Dose of Atropine (mg): n/a Dose of Dobutamine: n/a mcg/kg/min (at max HR)  Stress Test Technologist: Milana Na, EMT-P  Nuclear Technologist:  Domenic Polite, CNMT     Rest Procedure:  Myocardial perfusion imaging was performed at rest 45 minutes following the intravenous administration of Technetium 78m Sestamibi. Rest ECG: P synch pacing  Stress Procedure:  The patient received IV Lexiscan 0.4 mg over 15-seconds.   Technetium 75m Sestamibi injected at 30-seconds.  Quantitative spect images were obtained after a 45 minute delay. Stress ECG: No significant change from baseline ECG  QPS Raw Data Images:  Normal; no motion artifact; normal heart/lung ratio. Stress Images:  Normal homogeneous uptake in all areas of the myocardium. Rest Images:  Normal homogeneous uptake in all areas of the myocardium. Subtraction (SDS):  Normal Transient Ischemic Dilatation (Normal <1.22):  n/a Lung/Heart Ratio (Normal <0.45):  0.38  Quantitative Gated Spect Images QGS EDV:  81 ml QGS ESV:  33 ml  Impression Exercise Capacity:  Lexiscan with no exercise. BP Response:  Normal blood pressure response. Clinical Symptoms:  No significant symptoms noted. ECG Impression:  No significant ST segment change suggestive of ischemia. Comparison with Prior Nuclear Study: No images to compare  Overall Impression:  Normal stress nuclear study.  LV Ejection Fraction: 60%.  LV Wall Motion:  NL LV Function; NL Wall Motion   Charlton Haws

## 2013-03-17 ENCOUNTER — Encounter: Payer: Self-pay | Admitting: Cardiovascular Disease

## 2013-03-24 DIAGNOSIS — K635 Polyp of colon: Secondary | ICD-10-CM | POA: Insufficient documentation

## 2013-03-28 ENCOUNTER — Ambulatory Visit: Payer: Medicare Other | Admitting: Cardiovascular Disease

## 2013-03-28 ENCOUNTER — Telehealth: Payer: Self-pay | Admitting: Internal Medicine

## 2013-03-28 NOTE — Telephone Encounter (Signed)
Rec'd from [facility] forward 8 pages to Dr.Pyrtle

## 2013-03-31 NOTE — Telephone Encounter (Signed)
error 

## 2013-04-06 ENCOUNTER — Ambulatory Visit: Payer: Medicare Other | Admitting: Family Medicine

## 2013-04-06 ENCOUNTER — Telehealth: Payer: Self-pay | Admitting: Family Medicine

## 2013-04-06 MED ORDER — HYDROCODONE-ACETAMINOPHEN 10-325 MG PO TABS: 1.0000 | ORAL_TABLET | Freq: Four times a day (QID) | ORAL | Status: DC | PRN

## 2013-04-06 NOTE — Telephone Encounter (Signed)
Pt needs refill of HYDROcodone-acetaminophen (NORCO) 10-325 MG per tablet Pt states she fell sat and hurt knee.  Pt was going to get rx today at appt but had to cancel b/c son is in the hospital. Pt would like to p/u rx today b/c she is out of meds.

## 2013-04-06 NOTE — Telephone Encounter (Signed)
Script is ready for pick up and I left a voice message.  

## 2013-04-06 NOTE — Telephone Encounter (Signed)
done

## 2013-04-08 ENCOUNTER — Other Ambulatory Visit: Payer: Self-pay | Admitting: Family Medicine

## 2013-04-08 NOTE — Telephone Encounter (Signed)
Call in #120 with 5 rf 

## 2013-04-08 NOTE — Telephone Encounter (Signed)
Pt states she is out of ALPRAZolam (XANAX) 1 MG tablet And needs this weekend. Pt did not realize she was out of refills. And pt out of med

## 2013-04-17 LAB — ICD DEVICE OBSERVATION

## 2013-04-18 ENCOUNTER — Ambulatory Visit (INDEPENDENT_AMBULATORY_CARE_PROVIDER_SITE_OTHER): Payer: Medicare Other

## 2013-04-18 DIAGNOSIS — I509 Heart failure, unspecified: Secondary | ICD-10-CM

## 2013-04-18 DIAGNOSIS — I428 Other cardiomyopathies: Secondary | ICD-10-CM

## 2013-04-18 DIAGNOSIS — I429 Cardiomyopathy, unspecified: Secondary | ICD-10-CM

## 2013-04-18 LAB — MDC_IDC_ENUM_SESS_TYPE_REMOTE
Battery Voltage: 3.13 V
Brady Statistic AS VP Percent: 99.48 %
Brady Statistic AS VS Percent: 0.1 %
Lead Channel Impedance Value: 1026 Ohm
Lead Channel Impedance Value: 399 Ohm
Lead Channel Impedance Value: 456 Ohm
Lead Channel Pacing Threshold Amplitude: 0.875 V
Lead Channel Pacing Threshold Amplitude: 2 V
Lead Channel Pacing Threshold Pulse Width: 0.4 ms
Lead Channel Pacing Threshold Pulse Width: 0.4 ms
Lead Channel Sensing Intrinsic Amplitude: 26.75 mV
Lead Channel Sensing Intrinsic Amplitude: 26.75 mV
Lead Channel Setting Pacing Amplitude: 1.5 V
Lead Channel Setting Pacing Amplitude: 2 V
Lead Channel Setting Pacing Amplitude: 2.5 V
Lead Channel Setting Pacing Pulse Width: 0.4 ms
Lead Channel Setting Pacing Pulse Width: 1 ms
Zone Setting Detection Interval: 320 ms
Zone Setting Detection Interval: 360 ms
Zone Setting Detection Interval: 360 ms

## 2013-04-20 ENCOUNTER — Ambulatory Visit (INDEPENDENT_AMBULATORY_CARE_PROVIDER_SITE_OTHER): Payer: Medicare Other | Admitting: Family Medicine

## 2013-04-20 ENCOUNTER — Encounter: Payer: Self-pay | Admitting: Family Medicine

## 2013-04-20 VITALS — BP 140/60 | HR 84 | Temp 98.4°F | Wt 176.0 lb

## 2013-04-20 DIAGNOSIS — Z23 Encounter for immunization: Secondary | ICD-10-CM

## 2013-04-20 DIAGNOSIS — E876 Hypokalemia: Secondary | ICD-10-CM

## 2013-04-20 DIAGNOSIS — N289 Disorder of kidney and ureter, unspecified: Secondary | ICD-10-CM

## 2013-04-20 DIAGNOSIS — I10 Essential (primary) hypertension: Secondary | ICD-10-CM

## 2013-04-20 DIAGNOSIS — Z86010 Personal history of colonic polyps: Secondary | ICD-10-CM

## 2013-04-20 MED ORDER — FUROSEMIDE 80 MG PO TABS: 80.0000 mg | ORAL_TABLET | Freq: Every day | ORAL | Status: DC

## 2013-04-20 MED ORDER — POTASSIUM CHLORIDE ER 10 MEQ PO TBCR: 10.0000 meq | EXTENDED_RELEASE_TABLET | Freq: Two times a day (BID) | ORAL | Status: DC

## 2013-04-20 NOTE — Progress Notes (Signed)
  Subjective:    Patient ID: Sydney Phillips, female    DOB: 1936/06/09, 77 y.o.   MRN: [id number]  HPI Her to recheck a low potassium prior to a planned procedure on 04-25-13 at [facility]. She has 2 large sessile polyps (the largest of which is 4 cm in diameter) at the hepatic flexure of her colon. Dr. Gwinda Passe will be performing a high definition colonoscopy to try to remove these and avoid a partial colon resection. She feels tired all the time with some SOB but no chest pains or palpitations. She has been drinking large amounts of orange juice.    Review of Systems  Constitutional: Positive for fatigue.  Respiratory: Positive for shortness of breath.   Cardiovascular: Negative.        Objective:   Physical Exam  Constitutional: She is oriented to person, place, and time. She appears well-developed and well-nourished.  Walks with her walker   Cardiovascular: Normal rate, regular rhythm, normal heart sounds and intact distal pulses.   Pulmonary/Chest: Effort normal and breath sounds normal. No respiratory distress. She has no wheezes. She has no rales.  Neurological: She is alert and oriented to person, place, and time.          Assessment & Plan:  We will decrease her lasix to 80 mg once a day. Start on Klor-con 10 mEq bid. Check a BMET today. If we can get this stabilized, hopefully she can proceed with the endoscopic procedure as planned.

## 2013-04-21 LAB — BASIC METABOLIC PANEL
Calcium: 9.5 mg/dL (ref 8.4–10.5)
Creatinine, Ser: 1.2 mg/dL (ref 0.4–1.2)
GFR: 47.43 mL/min — ABNORMAL LOW (ref >60.00)
Glucose, Bld: 147 mg/dL — ABNORMAL HIGH (ref 70–99)
Sodium: 140 mEq/L (ref 135–145)

## 2013-04-25 NOTE — Addendum Note (Signed)
Addended by: Gershon Crane A on: 04/25/2013 05:42 AM   Modules accepted: Orders

## 2013-04-25 NOTE — Progress Notes (Signed)
Quick Note:  I spoke with pt ______ 

## 2013-04-26 ENCOUNTER — Telehealth (HOSPITAL_COMMUNITY): Payer: Self-pay | Admitting: *Deleted

## 2013-05-03 ENCOUNTER — Encounter: Payer: Self-pay | Admitting: *Deleted

## 2013-05-03 ENCOUNTER — Telehealth: Payer: Self-pay | Admitting: *Deleted

## 2013-05-03 NOTE — Telephone Encounter (Signed)
I asked Sydney Phillips about CHF sxms. Patient has no c/o of SOB or Edema. She states that she has surgery for colon CA last week, but overall feels better than she has in a while. I encouraged patient to avoid salts especially during the holidays. Patient voiced understanding and stated that salt is not in her diet.

## 2013-06-02 ENCOUNTER — Ambulatory Visit: Payer: Medicare Other | Admitting: Family Medicine

## 2013-06-02 ENCOUNTER — Ambulatory Visit: Payer: Medicare Other | Admitting: Cardiovascular Disease

## 2013-06-02 ENCOUNTER — Ambulatory Visit (INDEPENDENT_AMBULATORY_CARE_PROVIDER_SITE_OTHER): Payer: Medicare Other | Admitting: Family Medicine

## 2013-06-02 ENCOUNTER — Encounter: Payer: Self-pay | Admitting: Family Medicine

## 2013-06-02 ENCOUNTER — Encounter: Payer: Self-pay | Admitting: *Deleted

## 2013-06-02 VITALS — BP 132/68 | HR 92 | Temp 98.2°F | Wt 180.0 lb

## 2013-06-02 DIAGNOSIS — M79609 Pain in unspecified limb: Secondary | ICD-10-CM

## 2013-06-02 DIAGNOSIS — I739 Peripheral vascular disease, unspecified: Secondary | ICD-10-CM

## 2013-06-02 DIAGNOSIS — M79604 Pain in right leg: Secondary | ICD-10-CM

## 2013-06-02 NOTE — Progress Notes (Signed)
Pre visit review using our clinic review tool, if applicable. No additional management support is needed unless otherwise documented below in the visit note. 

## 2013-06-02 NOTE — Patient Instructions (Signed)
Peripheral Vascular Disease Peripheral Vascular Disease (PVD), also called Peripheral Arterial Disease (PAD), is a circulation problem caused by cholesterol (atherosclerotic plaque) deposits in the arteries. PVD commonly occurs in the lower extremities (legs) but it can occur in other areas of the body, such as your arms. The cholesterol buildup in the arteries reduces blood flow which can cause pain and other serious problems. The presence of PVD can place a person at risk for Coronary Artery Disease (CAD).  CAUSES  Causes of PVD can be many. It is usually associated with more than one risk factor such as:   High Cholesterol.  Smoking.  Diabetes.  Lack of exercise or inactivity.  High blood pressure (hypertension).  Obesity.  Family history. SYMPTOMS   When the lower extremities are affected, patients with PVD may experience:  Leg pain with exertion or physical activity. This is called INTERMITTENT CLAUDICATION. This may present as cramping or numbness with physical activity. The location of the pain is associated with the level of blockage. For example, blockage at the abdominal level (distal abdominal aorta) may result in buttock or hip pain. Lower leg arterial blockage may result in calf pain.  As PVD becomes more severe, pain can develop with less physical activity.  In people with severe PVD, leg pain may occur at rest.  Other PVD signs and symptoms:  Leg numbness or weakness.  Coldness in the affected leg or foot, especially when compared to the other leg.  A change in leg color.  Patients with significant PVD are more prone to ulcers or sores on toes, feet or legs. These may take longer to heal or may reoccur. The ulcers or sores can become infected.  If signs and symptoms of PVD are ignored, gangrene may occur. This can result in the loss of toes or loss of an entire limb.  Not all leg pain is related to PVD. Other medical conditions can cause leg pain such  as:  Blood clots (embolism) or Deep Vein Thrombosis.  Inflammation of the blood vessels (vasculitis).  Spinal stenosis. DIAGNOSIS  Diagnosis of PVD can involve several different types of tests. These can include:  Pulse Volume Recording Method (PVR). This test is simple, painless and does not involve the use of X-rays. PVR involves measuring and comparing the blood pressure in the arms and legs. An ABI (Ankle-Brachial Index) is calculated. The normal ratio of blood pressures is 1. As this number becomes smaller, it indicates more severe disease.  < 0.95  indicates significant narrowing in one or more leg vessels.  <0.8 there will usually be pain in the foot, leg or buttock with exercise.  <0.4 will usually have pain in the legs at rest.  <0.25  usually indicates limb threatening PVD.  Doppler detection of pulses in the legs. This test is painless and checks to see if you have a pulses in your legs/feet.  A dye or contrast material (a substance that highlights the blood vessels so they show up on x-ray) may be given to help your caregiver better see the arteries for the following tests. The dye is eliminated from your body by the kidney's. Your caregiver may order blood work to check your kidney function and other laboratory values before the following tests are performed:  Magnetic Resonance Angiography (MRA). An MRA is a picture study of the blood vessels and arteries. The MRA machine uses a large magnet to produce images of the blood vessels.  Computed Tomography Angiography (CTA). A CTA is a   specialized x-ray that looks at how the blood flows in your blood vessels. An IV may be inserted into your arm so contrast dye can be injected.  Angiogram. Is a procedure that uses x-rays to look at your blood vessels. This procedure is minimally invasive, meaning a small incision (cut) is made in your groin. A small tube (catheter) is then inserted into the artery of your groin. The catheter is  guided to the blood vessel or artery your caregiver wants to examine. Contrast dye is injected into the catheter. X-rays are then taken of the blood vessel or artery. After the images are obtained, the catheter is taken out. TREATMENT  Treatment of PVD involves many interventions which may include:  Lifestyle changes:  Quitting smoking.  Exercise.  Following a low fat, low cholesterol diet.  Control of diabetes.  Foot care is very important to the PVD patient. Good foot care can help prevent infection.  Medication:  Cholesterol-lowering medicine.  Blood pressure medicine.  Anti-platelet drugs.  Certain medicines may reduce symptoms of Intermittent Claudication.  Interventional/Surgical options:  Angioplasty. An Angioplasty is a procedure that inflates a balloon in the blocked artery. This opens the blocked artery to improve blood flow.  Stent Implant. A wire mesh tube (stent) is placed in the artery. The stent expands and stays in place, allowing the artery to remain open.  Peripheral Bypass Surgery. This is a surgical procedure that reroutes the blood around a blocked artery to help improve blood flow. This type of procedure may be performed if Angioplasty or stent implants are not an option. SEEK IMMEDIATE MEDICAL CARE IF:   You develop pain or numbness in your arms or legs.  Your arm or leg turns cold, becomes blue in color.  You develop redness, warmth, swelling and pain in your arms or legs. MAKE SURE YOU:   Understand these instructions.  Will watch your condition.  Will get help right away if you are not doing well or get worse. Document Released: 07/10/2004 Document Revised: 08/25/2011 Document Reviewed: 06/06/2008 Exit[employer] Patient Information 2014 [employer].  

## 2013-06-02 NOTE — Progress Notes (Signed)
Subjective:    Patient ID: Sydney Phillips, female    DOB: 1935/12/01, 77 y.o.   MRN: [id number]  HPI Patient seen with right leg pain. Present for several weeks and somewhat progressive. She complains of frequent pains which are located between her right knee and right foot. Pain is worse with ambulation but occasionally at rest. She also has frequent and numbness involving right leg. Denies any lumbar back pain. She has had previous lumbosacral disc surgery. No thigh pain. She has known history of peripheral artery disease and ongoing nicotine use. Denies any lower extremity weakness. She has apparently had previous interventions involving her right iliac system.  Both feet are cold to touch but she has noted occasionally her right foot as a different color than the left. She states she has had 5 previous stents placed in her right lower extremity. No history of diabetes. She has multiple other chronic problems including history of CAD, ischemic cardiomyopathy, hypertension, hyperlipidemia, COPD, GERD.  Past Medical History  Diagnosis Date  . COPD (chronic obstructive pulmonary disease)   . Hx of colonic polyps   . Insomnia   . Fibromyalgia   . Hyperlipemia     takes Crestor daily  . CAD (coronary artery disease)     Currently angina free, no evidence of reversible ischemia  . Vertigo     takes Meclizine prn  . Subclavian arterial stenosis, lt, with PTA/STENT 07/31/11 08/01/2011  . S/P angioplasty with stent, lt. subclavian 07/31/11 08/01/2011  . Hypertension     takes Amlodipine and Metoprolol daily  . Bronchitis   . Arthritis   . Chronic back pain   . GERD (gastroesophageal reflux disease)     was on meds but was taken off;now watches what she eats  . Chronic constipation   . Hemorrhoids   . Urinary incontinence   . Early cataracts, bilateral   . Anxiety   . Depression     takes Cymbalta daily  . Congestive heart failure (CHF)     New York Heart Association functional class  2, diastolic dysfunction  . PAD (peripheral artery disease)     Carotid, subclavian, and lower extremity beds, currently not symptomatic  . Syncope 07/28/2011    EF - 50-55, moderate concentric hypertrophy in left ventricle  . LBBB (left bundle branch block)     Stress test 09/03/2010, EF 55  . Nephrolithiasis    Past Surgical History  Procedure Laterality Date  . Appendectomy    . Tonsillectomy    . Tubal ligation    . Cardiac defibrillator placement  05/2008    By Dr Fredrich Birks, [employer] CANNOT HAVE MRI's  . Abdominal hysterectomy    . Back surgery  2012  . Orif elbow fracture  08/16/2011    Procedure: OPEN REDUCTION INTERNAL FIXATION (ORIF) ELBOW/OLECRANON FRACTURE;  Surgeon: Marlowe Shores, MD;  Location: [facility] OR;  Service: Orthopedics;  Laterality: Left;  . Cardiac catheterization  12/01/2007    By Dr. Elsie Lincoln, left heart cath,   . Coronary angioplasty    . Subclavian stent placement Left 07/31/2011    7x18 Genesis, balloon, with reduction of 90% ostial left subclavian artery stenosis to 0% with residual excellent flow    reports that she has been smoking Cigarettes.  She has been smoking about 0.00 packs per day for the past 60 years. She has never used smokeless tobacco. She reports that she does not drink alcohol or use illicit drugs. family history includes CAD in her  father; Cancer in her sister; Colon cancer in her maternal grandmother; Hyperlipidemia in an other family member. There is no history of Anesthesia problems, Hypotension, Malignant hyperthermia, or Pseudochol deficiency. Allergies  Allergen Reactions  . Codeine Itching  . Darvon Itching  . Meloxicam Other (See Comments)    Unknown    . Propoxyphene-Acetaminophen Itching  . Rofecoxib Other (See Comments)    Unknown    . Azithromycin Rash  . Erythromycin Rash  . Penicillins Rash  . Sulfa Antibiotics Rash      Review of Systems  Respiratory: Negative for shortness of breath.   Cardiovascular: Negative for  chest pain.  Gastrointestinal: Negative for abdominal pain.  Neurological: Positive for numbness. Negative for weakness.       Objective:   Physical Exam  Constitutional: She appears well-developed and well-nourished.  Cardiovascular: Normal rate.   Pulmonary/Chest: Effort normal. No respiratory distress.  Musculoskeletal: She exhibits no edema.  Both feet are cool to touch. She has decreased dorsalis pedis and posterior tibial pulses on the right compared to left. She has somewhat slow capillary refill in both feet right greater than left. Femoral pulses are 1+ bilaterally No evidence for necrosis.  Neurological:  Full-strength with plantar flexion dorsiflexion bilaterally. Normal sensory function to touch in the foot and leg          Assessment & Plan:  Right leg pain. Suspect related to claudication symptoms. She does have clear evidence on exam of peripheral vascular disease but is currently asymptomatic at rest and has no evidence for any limb threatening ischemia. Repeat arterial Dopplers. Will likely need referral to vascular specialist. She is strongly encouraged to stop smoking. Continue aspirin and Plavix.

## 2013-06-06 ENCOUNTER — Encounter (HOSPITAL_COMMUNITY): Payer: Medicare Other

## 2013-06-06 ENCOUNTER — Other Ambulatory Visit: Payer: Self-pay | Admitting: Family Medicine

## 2013-06-06 ENCOUNTER — Ambulatory Visit (HOSPITAL_COMMUNITY): Payer: Medicare Other | Attending: Family Medicine

## 2013-06-06 DIAGNOSIS — M79609 Pain in unspecified limb: Secondary | ICD-10-CM

## 2013-06-06 DIAGNOSIS — I739 Peripheral vascular disease, unspecified: Secondary | ICD-10-CM

## 2013-06-06 DIAGNOSIS — I70219 Atherosclerosis of native arteries of extremities with intermittent claudication, unspecified extremity: Secondary | ICD-10-CM

## 2013-06-06 DIAGNOSIS — M79604 Pain in right leg: Secondary | ICD-10-CM

## 2013-06-14 ENCOUNTER — Other Ambulatory Visit: Payer: Self-pay | Admitting: Family Medicine

## 2013-06-14 ENCOUNTER — Telehealth: Payer: Self-pay | Admitting: Family Medicine

## 2013-06-14 DIAGNOSIS — I739 Peripheral vascular disease, unspecified: Secondary | ICD-10-CM

## 2013-06-14 NOTE — Telephone Encounter (Signed)
Pt informed again and order is placed

## 2013-06-14 NOTE — Telephone Encounter (Signed)
I responded to her arterial doppler results yesterday.  I recommend she see [facility] for her PAD.

## 2013-06-14 NOTE — Telephone Encounter (Signed)
Dr Caryl Never saw pt and was sent for doppler. Pt  having trouble walking and possible fall risk.. would like to know what to do now. pls advise

## 2013-06-21 ENCOUNTER — Ambulatory Visit: Payer: Medicare Other | Admitting: Family

## 2013-07-13 ENCOUNTER — Encounter: Payer: Self-pay | Admitting: Vascular Surgery

## 2013-07-14 ENCOUNTER — Encounter: Payer: Self-pay | Admitting: Vascular Surgery

## 2013-07-14 ENCOUNTER — Encounter: Payer: Medicare Other | Admitting: Vascular Surgery

## 2013-07-14 ENCOUNTER — Ambulatory Visit (INDEPENDENT_AMBULATORY_CARE_PROVIDER_SITE_OTHER): Payer: Medicare Other | Admitting: Vascular Surgery

## 2013-07-14 VITALS — BP 151/76 | HR 86 | Ht 65.5 in | Wt 179.2 lb

## 2013-07-14 DIAGNOSIS — I70219 Atherosclerosis of native arteries of extremities with intermittent claudication, unspecified extremity: Secondary | ICD-10-CM

## 2013-07-14 NOTE — Progress Notes (Addendum)
[facility] PH[facility]   History of Present Illness:  Patient is a 78 y.o. year old female who presents for evaluation of right leg numbness and weakness. This is been present for approximately 3 months. She thinks she also has some early symptoms similar to this in the left leg. She does not walk very much to determine whether or not she has claudication. She denies rest pain. She denies nonhealing wounds on the foot. She states that in the past she thinks she may have had some sort of stent put in her leg by Dr. Gwenlyn Found. I was unable to find details regarding this in her EPIC chart today.  She did have a left subclavian stent placed by Dr. Alvester Chou several years ago this was done for dizziness.  Other medical problems include COPD, hyperlipidemia, coronary disease, hypertension, depression, CHF and has an AICD pacer.  She currently smokes half a pack of cigarettes per day. Greater than 3 minutes they spent regarding smoking cessation counseling. She is on Plavix and aspirin. However she stopped her Plavix 10 days ago and has yet to refill her prescription.  Past Medical History  Diagnosis Date  . COPD (chronic obstructive pulmonary disease)   . Hx of colonic polyps   . Insomnia   . Fibromyalgia   . Hyperlipemia     takes Crestor daily  . CAD (coronary artery disease)     Currently angina free, no evidence of reversible ischemia  . Vertigo     takes Meclizine prn  . Subclavian arterial stenosis, lt, with PTA/STENT 07/31/11 08/01/2011  . S/P angioplasty with stent, lt. subclavian 07/31/11 08/01/2011  . Hypertension     takes Amlodipine and Metoprolol daily  . Bronchitis   . Arthritis   . Chronic back pain   . GERD (gastroesophageal reflux disease)     was on meds but was taken off;now watches what she eats  . Chronic constipation   . Hemorrhoids   . Urinary incontinence   . Early cataracts, bilateral   . Anxiety   . Depression     takes Cymbalta daily   . Congestive heart failure (CHF)     New York Heart Association functional class 2, diastolic dysfunction  . PAD (peripheral artery disease)     Carotid, subclavian, and lower extremity beds, currently not symptomatic  . Syncope 07/28/2011    EF - 50-55, moderate concentric hypertrophy in left ventricle  . LBBB (left bundle branch block)     Stress test 09/03/2010, EF 55  . Nephrolithiasis     Past Surgical History  Procedure Laterality Date  . Appendectomy    . Tonsillectomy    . Tubal ligation    . Cardiac defibrillator placement  05/2008    By Dr Blanch Media, [employer] CANNOT HAVE MRI's  . Abdominal hysterectomy    . Back surgery  2012  . Orif elbow fracture  08/16/2011    Procedure: OPEN REDUCTION INTERNAL FIXATION (ORIF) ELBOW/OLECRANON FRACTURE;  Surgeon: Schuyler Amor, MD;  Location: [facility];  Service: Orthopedics;  Laterality: Left;  . Cardiac catheterization  12/01/2007    By Dr. Melvern Banker, left heart cath,   . Coronary angioplasty    . Subclavian stent placement Left 07/31/2011    7x18 Genesis, balloon, with reduction of 90% ostial left subclavian artery stenosis to 0% with residual excellent flow    Social History History  Substance Use Topics  . Smoking status: Current Every Day Smoker -- 0.50  packs/day for 60 years    Types: Cigarettes  . Smokeless tobacco: Never Used     Comment: less than 1/2 pack  . Alcohol Use: No    Family History Family History  Problem Relation Age of Onset  . Hyperlipidemia    . Anesthesia problems Neg Hx   . Hypotension Neg Hx   . Malignant hyperthermia Neg Hx   . Pseudochol deficiency Neg Hx   . Colon cancer Maternal Grandmother   . Cancer Sister     ovarian  . Diabetes Sister   . Heart disease Sister   . CAD Father   . Heart disease Father   . Hyperlipidemia Father   . Heart disease Mother   . Deep vein thrombosis Son     Allergies  Allergies  Allergen Reactions  . Codeine Itching  . Darvon Itching  . Meloxicam Other  (See Comments)    Unknown    . Propoxyphene N-Acetaminophen Itching  . Rofecoxib Other (See Comments)    Unknown    . Azithromycin Rash  . Erythromycin Rash  . Penicillins Rash  . Sulfa Antibiotics Rash     Current Outpatient Prescriptions  Medication Sig Dispense Refill  . ALPRAZolam (XANAX) 1 MG tablet TAKE ONE TABLET BY MOUTH 4 TIMES DAILY  120 tablet  5  . aspirin 325 MG tablet Take 325 mg by mouth daily.      . CYMBALTA 60 MG capsule TAKE ONE CAPSULE BY MOUTH TWICE DAILY  60 capsule  11  . furosemide (LASIX) 80 MG tablet TAKE ONE TABLET BY MOUTH TWICE DAILY  180 tablet  1  . metoprolol tartrate (LOPRESSOR) 25 MG tablet 12.5 mg 2 (two) times daily.       . clopidogrel (PLAVIX) 75 MG tablet Take 1 tablet (75 mg total) by mouth daily.  30 tablet  11  . furosemide (LASIX) 80 MG tablet Take 1 tablet (80 mg total) by mouth daily.  180 tablet  1  . HYDROcodone-acetaminophen (NORCO) 10-325 MG per tablet Take 1 tablet by mouth every 6 (six) hours as needed for pain.  60 tablet  0  . potassium chloride (KLOR-CON 10) 10 MEQ tablet Take 1 tablet (10 mEq total) by mouth 2 (two) times daily.  60 tablet  11   No current facility-administered medications for this visit.    ROS:   General:  No weight loss, Fever, chills  HEENT: No recent headaches, no nasal bleeding, no visual changes, no sore throat  Neurologic: + dizziness, blackouts, seizures. No recent symptoms of stroke or mini- stroke. No recent episodes of slurred speech, or temporary blindness.  Cardiac: No recent episodes of chest pain/pressure, no shortness of breath at rest.  + shortness of breath with exertion.  Denies history of atrial fibrillation or irregular heartbeat  Vascular: No history of rest pain in feet.  +history of claudication.  No history of non-healing ulcer, No history of DVT   Pulmonary: No home oxygen, no productive cough, no hemoptysis,  No asthma or wheezing  Musculoskeletal:  [ ]  Arthritis, [x ] Low  back pain,  [ ]  Joint pain  Hematologic:No history of hypercoagulable state.  No history of easy bleeding.  No history of anemia  Gastrointestinal: No hematochezia or melena,  + gastroesophageal reflux, no trouble swallowing  Urinary: [ ]  chronic Kidney disease, [ ]  on HD - [ ]  MWF or [ ]  TTHS, [ ]  Burning with urination, [ ]  Frequent urination, [ ]  Difficulty urinating;  Skin: No rashes  Psychological: No history of anxiety,  + history of depression   Physical Examination  Filed Vitals:   07/14/13 1511  BP: 151/76  Pulse: 86  Height: 5' 5.5" (1.664 m)  Weight: 179 lb 3.2 oz (81.285 kg)  SpO2: 95%    Body mass index is 29.36 kg/(m^2).  General:  Alert and oriented, no acute distress HEENT: Normal Neck: No bruit or JVD Pulmonary: Clear to auscultation bilaterally Cardiac: Regular Rate and Rhythm without murmur Abdomen: Soft, non-tender, non-distended, no mass Skin: No rash Extremity Pulses:  2+ radial, brachial right side absent left radial pulse 2+ left brachial pulse, 2+ femoral, absent dorsalis pedis, posterior tibial pulses bilaterally Musculoskeletal: No deformity or edema  Neurologic: Upper and lower extremity motor 5/5 and symmetric  DATA:  The patient had a noninvasive arterial exam dated December 22 which I reviewed today. She an ABI on the right 0.47 left 0.93 this suggested a right popliteal artery stenosis   ASSESSMENT:  Claudication right lower extremity lifestyle limiting   PLAN:  Smoking cessation was emphasized. The patient will have a lower extremity arteriogram scheduled in the next few weeks for further evaluation of her right lower extremity possible angioplasty and stenting.  Ruta Hinds, MD [facility] [place] Office: [phone] Pager: [phone]    07/28/13 Dr Shary Decamp has also requested we evaluate renal artery stenosis.  Prior CT shows at least 50% bilat will do renal angio possible stent simultaneously as  dye load allows.  Will bring in early that morning for hydration.  Ruta Hinds, MD [facility] [place] Office: [phone] Pager: [phone]

## 2013-07-21 ENCOUNTER — Telehealth (HOSPITAL_COMMUNITY): Payer: Self-pay | Admitting: *Deleted

## 2013-07-28 ENCOUNTER — Other Ambulatory Visit: Payer: Self-pay | Admitting: *Deleted

## 2013-07-29 ENCOUNTER — Encounter (HOSPITAL_COMMUNITY): Payer: Self-pay | Admitting: Pharmacy Technician

## 2013-07-29 ENCOUNTER — Other Ambulatory Visit: Payer: Self-pay

## 2013-08-04 ENCOUNTER — Telehealth: Payer: Self-pay | Admitting: Family Medicine

## 2013-08-04 MED ORDER — HYDROCODONE-ACETAMINOPHEN 10-325 MG PO TABS: 1.0000 | ORAL_TABLET | Freq: Four times a day (QID) | ORAL | Status: DC | PRN

## 2013-08-04 NOTE — Telephone Encounter (Signed)
Pt needs new rx hydrocodone °

## 2013-08-04 NOTE — Telephone Encounter (Signed)
done

## 2013-08-05 NOTE — Telephone Encounter (Signed)
Script is ready for pick up and I left a voice message.  

## 2013-08-12 ENCOUNTER — Encounter (HOSPITAL_COMMUNITY)
Admission: RE | Disposition: A | Discharge status: Home / Self Care | Payer: Self-pay | Source: Ambulatory Visit | Attending: Vascular Surgery

## 2013-08-12 ENCOUNTER — Ambulatory Visit (HOSPITAL_COMMUNITY)
Admission: RE | Admit: 2013-08-12 | Discharge: 2013-08-12 | Disposition: A | Discharge status: Home / Self Care | Payer: Medicare Other | Source: Ambulatory Visit | Attending: Vascular Surgery | Admitting: Vascular Surgery

## 2013-08-12 DIAGNOSIS — G8929 Other chronic pain: Secondary | ICD-10-CM | POA: Insufficient documentation

## 2013-08-12 DIAGNOSIS — F3289 Other specified depressive episodes: Secondary | ICD-10-CM | POA: Insufficient documentation

## 2013-08-12 DIAGNOSIS — F411 Generalized anxiety disorder: Secondary | ICD-10-CM | POA: Insufficient documentation

## 2013-08-12 DIAGNOSIS — R42 Dizziness and giddiness: Secondary | ICD-10-CM | POA: Insufficient documentation

## 2013-08-12 DIAGNOSIS — I70219 Atherosclerosis of native arteries of extremities with intermittent claudication, unspecified extremity: Secondary | ICD-10-CM | POA: Insufficient documentation

## 2013-08-12 DIAGNOSIS — H269 Unspecified cataract: Secondary | ICD-10-CM | POA: Insufficient documentation

## 2013-08-12 DIAGNOSIS — K649 Unspecified hemorrhoids: Secondary | ICD-10-CM | POA: Insufficient documentation

## 2013-08-12 DIAGNOSIS — Z95810 Presence of automatic (implantable) cardiac defibrillator: Secondary | ICD-10-CM | POA: Insufficient documentation

## 2013-08-12 DIAGNOSIS — F329 Major depressive disorder, single episode, unspecified: Secondary | ICD-10-CM | POA: Insufficient documentation

## 2013-08-12 DIAGNOSIS — G4700 Insomnia, unspecified: Secondary | ICD-10-CM | POA: Insufficient documentation

## 2013-08-12 DIAGNOSIS — F17200 Nicotine dependence, unspecified, uncomplicated: Secondary | ICD-10-CM | POA: Insufficient documentation

## 2013-08-12 DIAGNOSIS — I10 Essential (primary) hypertension: Secondary | ICD-10-CM | POA: Insufficient documentation

## 2013-08-12 DIAGNOSIS — Z7982 Long term (current) use of aspirin: Secondary | ICD-10-CM | POA: Insufficient documentation

## 2013-08-12 DIAGNOSIS — Z87442 Personal history of urinary calculi: Secondary | ICD-10-CM | POA: Insufficient documentation

## 2013-08-12 DIAGNOSIS — R32 Unspecified urinary incontinence: Secondary | ICD-10-CM | POA: Insufficient documentation

## 2013-08-12 DIAGNOSIS — Z7902 Long term (current) use of antithrombotics/antiplatelets: Secondary | ICD-10-CM | POA: Insufficient documentation

## 2013-08-12 DIAGNOSIS — I447 Left bundle-branch block, unspecified: Secondary | ICD-10-CM | POA: Insufficient documentation

## 2013-08-12 DIAGNOSIS — I509 Heart failure, unspecified: Secondary | ICD-10-CM | POA: Insufficient documentation

## 2013-08-12 DIAGNOSIS — M549 Dorsalgia, unspecified: Secondary | ICD-10-CM | POA: Insufficient documentation

## 2013-08-12 DIAGNOSIS — K219 Gastro-esophageal reflux disease without esophagitis: Secondary | ICD-10-CM | POA: Insufficient documentation

## 2013-08-12 DIAGNOSIS — J4489 Other specified chronic obstructive pulmonary disease: Secondary | ICD-10-CM | POA: Insufficient documentation

## 2013-08-12 DIAGNOSIS — J449 Chronic obstructive pulmonary disease, unspecified: Secondary | ICD-10-CM | POA: Insufficient documentation

## 2013-08-12 DIAGNOSIS — I2510 Atherosclerotic heart disease of native coronary artery without angina pectoris: Secondary | ICD-10-CM | POA: Insufficient documentation

## 2013-08-12 DIAGNOSIS — E785 Hyperlipidemia, unspecified: Secondary | ICD-10-CM | POA: Insufficient documentation

## 2013-08-12 DIAGNOSIS — IMO0001 Reserved for inherently not codable concepts without codable children: Secondary | ICD-10-CM | POA: Insufficient documentation

## 2013-08-12 DIAGNOSIS — K5900 Constipation, unspecified: Secondary | ICD-10-CM | POA: Insufficient documentation

## 2013-08-12 HISTORY — PX: RENAL ANGIOGRAM: SHX5509

## 2013-08-12 HISTORY — PX: ABDOMINAL AORTAGRAM: SHX5454

## 2013-08-12 LAB — POCT I-STAT, CHEM 8
BUN: 10 mg/dL (ref 6–23)
Calcium, Ion: 1.09 mmol/L — ABNORMAL LOW (ref 1.13–1.30)
Chloride: 98 mEq/L (ref 96–112)
Creatinine, Ser: 0.9 mg/dL (ref 0.50–1.10)
Glucose, Bld: 126 mg/dL — ABNORMAL HIGH (ref 70–99)
HEMATOCRIT: 43 % (ref 36.0–46.0)
HEMOGLOBIN: 14.6 g/dL (ref 12.0–15.0)
Potassium: 2.4 mEq/L — CL (ref 3.7–5.3)
SODIUM: 143 meq/L (ref 137–147)
TCO2: 30 mmol/L (ref 0–100)

## 2013-08-12 SURGERY — ABDOMINAL AORTAGRAM
Anesthesia: LOCAL

## 2013-08-12 MED ORDER — ONDANSETRON HCL 4 MG/2ML IJ SOLN: 4.0000 mg | Freq: Four times a day (QID) | INTRAMUSCULAR | Status: DC | PRN

## 2013-08-12 MED ORDER — HYDRALAZINE HCL 20 MG/ML IJ SOLN: 10.0000 mg | INTRAMUSCULAR | Status: DC | PRN

## 2013-08-12 MED ORDER — SODIUM CHLORIDE 0.9 % IV SOLN
INTRAVENOUS | Status: DC
  Administered 2013-08-12: 07:00:00 via INTRAVENOUS

## 2013-08-12 MED ORDER — ACETAMINOPHEN 325 MG PO TABS: 325.0000 mg | ORAL_TABLET | ORAL | Status: DC | PRN

## 2013-08-12 MED ORDER — POTASSIUM CHLORIDE CRYS ER 20 MEQ PO TBCR: 40.0000 meq | EXTENDED_RELEASE_TABLET | Freq: Once | ORAL | Status: DC

## 2013-08-12 MED ORDER — SODIUM CHLORIDE 0.45 % IV SOLN
INTRAVENOUS | Status: DC
  Administered 2013-08-12: 10:00:00 via INTRAVENOUS

## 2013-08-12 MED ORDER — METOPROLOL TARTRATE 1 MG/ML IV SOLN: 2.0000 mg | INTRAVENOUS | Status: DC | PRN

## 2013-08-12 MED ORDER — LABETALOL HCL 5 MG/ML IV SOLN
10.0000 mg | INTRAVENOUS | Status: DC | PRN
Start: 2013-08-12 — End: 2013-08-12

## 2013-08-12 MED ORDER — LIDOCAINE HCL (PF) 1 % IJ SOLN
INTRAMUSCULAR | Status: AC
  Filled 2013-08-12: qty 30

## 2013-08-12 MED ORDER — ACETAMINOPHEN 325 MG RE SUPP: 325.0000 mg | RECTAL | Status: DC | PRN

## 2013-08-12 MED ORDER — POTASSIUM CHLORIDE CRYS ER 20 MEQ PO TBCR
40.0000 meq | EXTENDED_RELEASE_TABLET | Freq: Once | ORAL | Status: AC
  Administered 2013-08-12: 40 meq via ORAL
  Filled 2013-08-12: qty 2

## 2013-08-12 MED ORDER — MORPHINE SULFATE 10 MG/ML IJ SOLN: 2.0000 mg | INTRAMUSCULAR | Status: DC | PRN

## 2013-08-12 MED ORDER — HEPARIN (PORCINE) IN NACL 2-0.9 UNIT/ML-% IJ SOLN
INTRAMUSCULAR | Status: AC
  Filled 2013-08-12: qty 1000

## 2013-08-12 MED ORDER — FENTANYL CITRATE 0.05 MG/ML IJ SOLN
INTRAMUSCULAR | Status: AC
  Filled 2013-08-12: qty 2

## 2013-08-12 MED ORDER — MIDAZOLAM HCL 2 MG/2ML IJ SOLN
INTRAMUSCULAR | Status: AC
  Filled 2013-08-12: qty 2

## 2013-08-12 NOTE — Progress Notes (Signed)
Discharge instruction given per MD order.  Pt and CG able to verbalize understanding.  Pt to car via wheelchair. 

## 2013-08-12 NOTE — Progress Notes (Signed)
Potassium 2.4 via ISTAT; Eliezer Champagne, RN notified

## 2013-08-12 NOTE — H&P (View-Only) (Signed)
[facility]ISTORY AND PHYSICAL   History of Present Illness:  Patient is a 78 y.o. year old female who presents for evaluation of right leg numbness and weakness. This is been present for approximately 3 months. She thinks she also has some early symptoms similar to this in the left leg. She does not walk very much to determine whether or not she has claudication. She denies rest pain. She denies nonhealing wounds on the foot. She states that in the past she thinks she may have had some sort of stent put in her leg by Dr. Gwenlyn Found. I was unable to find details regarding this in her EPIC chart today.  She did have a left subclavian stent placed by Dr. Alvester Chou several years ago this was done for dizziness.  Other medical problems include COPD, hyperlipidemia, coronary disease, hypertension, depression, CHF and has an AICD pacer.  She currently smokes half a pack of cigarettes per day. Greater than 3 minutes they spent regarding smoking cessation counseling. She is on Plavix and aspirin. However she stopped her Plavix 10 days ago and has yet to refill her prescription.  Past Medical History  Diagnosis Date  . COPD (chronic obstructive pulmonary disease)   . Hx of colonic polyps   . Insomnia   . Fibromyalgia   . Hyperlipemia     takes Crestor daily  . CAD (coronary artery disease)     Currently angina free, no evidence of reversible ischemia  . Vertigo     takes Meclizine prn  . Subclavian arterial stenosis, lt, with PTA/STENT 07/31/11 08/01/2011  . S/P angioplasty with stent, lt. subclavian 07/31/11 08/01/2011  . Hypertension     takes Amlodipine and Metoprolol daily  . Bronchitis   . Arthritis   . Chronic back pain   . GERD (gastroesophageal reflux disease)     was on meds but was taken off;now watches what she eats  . Chronic constipation   . Hemorrhoids   . Urinary incontinence   . Early cataracts, bilateral   . Anxiety   . Depression     takes Cymbalta daily   . Congestive heart failure (CHF)     New York Heart Association functional class 2, diastolic dysfunction  . PAD (peripheral artery disease)     Carotid, subclavian, and lower extremity beds, currently not symptomatic  . Syncope 07/28/2011    EF - 50-55, moderate concentric hypertrophy in left ventricle  . LBBB (left bundle branch block)     Stress test 09/03/2010, EF 55  . Nephrolithiasis     Past Surgical History  Procedure Laterality Date  . Appendectomy    . Tonsillectomy    . Tubal ligation    . Cardiac defibrillator placement  05/2008    By Dr Blanch Media, [employer] CANNOT HAVE MRI's  . Abdominal hysterectomy    . Back surgery  2012  . Orif elbow fracture  08/16/2011    Procedure: OPEN REDUCTION INTERNAL FIXATION (ORIF) ELBOW/OLECRANON FRACTURE;  Surgeon: Schuyler Amor, MD;  Location: [facility];  Service: Orthopedics;  Laterality: Left;  . Cardiac catheterization  12/01/2007    By Dr. Melvern Banker, left heart cath,   . Coronary angioplasty    . Subclavian stent placement Left 07/31/2011    7x18 Genesis, balloon, with reduction of 90% ostial left subclavian artery stenosis to 0% with residual excellent flow    Social History History  Substance Use Topics  . Smoking status: Current Every Day Smoker -- 0.50  packs/day for 60 years    Types: Cigarettes  . Smokeless tobacco: Never Used     Comment: less than 1/2 pack  . Alcohol Use: No    Family History Family History  Problem Relation Age of Onset  . Hyperlipidemia    . Anesthesia problems Neg Hx   . Hypotension Neg Hx   . Malignant hyperthermia Neg Hx   . Pseudochol deficiency Neg Hx   . Colon cancer Maternal Grandmother   . Cancer Sister     ovarian  . Diabetes Sister   . Heart disease Sister   . CAD Father   . Heart disease Father   . Hyperlipidemia Father   . Heart disease Mother   . Deep vein thrombosis Son     Allergies  Allergies  Allergen Reactions  . Codeine Itching  . Darvon Itching  . Meloxicam Other  (See Comments)    Unknown    . Propoxyphene N-Acetaminophen Itching  . Rofecoxib Other (See Comments)    Unknown    . Azithromycin Rash  . Erythromycin Rash  . Penicillins Rash  . Sulfa Antibiotics Rash     Current Outpatient Prescriptions  Medication Sig Dispense Refill  . ALPRAZolam (XANAX) 1 MG tablet TAKE ONE TABLET BY MOUTH 4 TIMES DAILY  120 tablet  5  . aspirin 325 MG tablet Take 325 mg by mouth daily.      . CYMBALTA 60 MG capsule TAKE ONE CAPSULE BY MOUTH TWICE DAILY  60 capsule  11  . furosemide (LASIX) 80 MG tablet TAKE ONE TABLET BY MOUTH TWICE DAILY  180 tablet  1  . metoprolol tartrate (LOPRESSOR) 25 MG tablet 12.5 mg 2 (two) times daily.       . clopidogrel (PLAVIX) 75 MG tablet Take 1 tablet (75 mg total) by mouth daily.  30 tablet  11  . furosemide (LASIX) 80 MG tablet Take 1 tablet (80 mg total) by mouth daily.  180 tablet  1  . HYDROcodone-acetaminophen (NORCO) 10-325 MG per tablet Take 1 tablet by mouth every 6 (six) hours as needed for pain.  60 tablet  0  . potassium chloride (KLOR-CON 10) 10 MEQ tablet Take 1 tablet (10 mEq total) by mouth 2 (two) times daily.  60 tablet  11   No current facility-administered medications for this visit.    ROS:   General:  No weight loss, Fever, chills  HEENT: No recent headaches, no nasal bleeding, no visual changes, no sore throat  Neurologic: + dizziness, blackouts, seizures. No recent symptoms of stroke or mini- stroke. No recent episodes of slurred speech, or temporary blindness.  Cardiac: No recent episodes of chest pain/pressure, no shortness of breath at rest.  + shortness of breath with exertion.  Denies history of atrial fibrillation or irregular heartbeat  Vascular: No history of rest pain in feet.  +history of claudication.  No history of non-healing ulcer, No history of DVT   Pulmonary: No home oxygen, no productive cough, no hemoptysis,  No asthma or wheezing  Musculoskeletal:  [ ]  Arthritis, [x ] Low  back pain,  [ ]  Joint pain  Hematologic:No history of hypercoagulable state.  No history of easy bleeding.  No history of anemia  Gastrointestinal: No hematochezia or melena,  + gastroesophageal reflux, no trouble swallowing  Urinary: [ ]  chronic Kidney disease, [ ]  on HD - [ ]  MWF or [ ]  TTHS, [ ]  Burning with urination, [ ]  Frequent urination, [ ]  Difficulty urinating;  Skin: No rashes  Psychological: No history of anxiety,  + history of depression   Physical Examination  Filed Vitals:   07/14/13 1511  BP: 151/76  Pulse: 86  Height: 5' 5.5" (1.664 m)  Weight: 179 lb 3.2 oz (81.285 kg)  SpO2: 95%    Body mass index is 29.36 kg/(m^2).  General:  Alert and oriented, no acute distress HEENT: Normal Neck: No bruit or JVD Pulmonary: Clear to auscultation bilaterally Cardiac: Regular Rate and Rhythm without murmur Abdomen: Soft, non-tender, non-distended, no mass Skin: No rash Extremity Pulses:  2+ radial, brachial right side absent left radial pulse 2+ left brachial pulse, 2+ femoral, absent dorsalis pedis, posterior tibial pulses bilaterally Musculoskeletal: No deformity or edema  Neurologic: Upper and lower extremity motor 5/5 and symmetric  DATA:  The patient had a noninvasive arterial exam dated December 22 which I reviewed today. She an ABI on the right 0.47 left 0.93 this suggested a right popliteal artery stenosis   ASSESSMENT:  Claudication right lower extremity lifestyle limiting   PLAN:  Smoking cessation was emphasized. The patient will have a lower extremity arteriogram scheduled in the next few weeks for further evaluation of her right lower extremity possible angioplasty and stenting.  Ruta Hinds, MD [facility] Office: [phone] Pager: [phone]    07/28/13 Dr Shary Decamp has also requested we evaluate renal artery stenosis.  Prior CT shows at least 50% bilat will do renal angio possible stent simultaneously as  dye load allows.  Will bring in early that morning for hydration.  Ruta Hinds, MD [facility] Office: [phone] Pager: [phone]

## 2013-08-12 NOTE — Interval H&P Note (Signed)
History and Physical Interval Note:  08/12/2013 8:52 AM  Sydney Phillips  has presented today for surgery, with the diagnosis of PVD  The various methods of treatment have been discussed with the patient and family. After consideration of risks, benefits and other options for treatment, the patient has consented to  Procedure(s): ABDOMINAL AORTAGRAM (N/A) RENAL ANGIOGRAM (N/A) as a surgical intervention .  The patient's history has been reviewed, patient examined, no change in status, stable for surgery.  I have reviewed the patient's chart and labs.  Questions were answered to the patient's satisfaction.     Jannice Beitzel E

## 2013-08-12 NOTE — Op Note (Signed)
Procedure: Aortogram with bilateral lower extremity runoff  Preoperative diagnosis: Claudication, rule out renal artery stenosis  Postoperative diagnosis: Same  Anesthesia: Local with IV sedation  Operative details: After obtaining informed consent, the patient was brought to the [facility] lab. The patient was placed in supine position the Angio table. Both groins were prepped and draped in usual sterile fashion. Local anesthesia was ruptured of the left common femoral artery. Ultrasound was used to identify the left common femoral artery. Using ultrasound guidance the left common femoral artery was cannulated successfully. An 81 Versacore wire was threaded up in the abdominal aorta under fluoroscopic guidance. A 5 French sheath was the guidewire the left common femoral artery. The 5 French sheath was thoroughly flushed with heparinized saline. Next a 5 French pigtail catheter was placed over the guidewire and the abdominal aorta. Abdominal aortogram was obtained. The left and right renal arteries are patent. The infrarenal abdominal aorta is patent. There is a right common iliac stent which is patent. The left common iliac artery is patent. A magnified view of the origins of the renal arteries was also performed to confirm there is no significant renal artery stenosis. Oblique views of the pelvis were then performed after pulling the pigtail catheter down just above the aortic bifurcation. This was done to further clarify the iliac arteries due to overlying spine hardware. The left and right common external and internal arteries are patent. Next lower extremity runoff views were obtained through the pigtail catheter.  In the right lower extremity, the right common femoral and profunda femoris artery is patent. The right superficial femoral artery is patent although there is a 40% stenosis in its midportion. 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 The popliteal artery at the  knee joint is occluded. The the anterior tibial artery has in line flow all the way to the foot but the distal third it is severely diseased with multiple areas of subtotal occlusion. The peroneal artery is occluded. The posterior tibial artery is the dominant runoff vessel to the right foot and is patent all the way to the level of the foot.  In the left lower extremity, the left common femoral artery is patent. The profunda femoris artery is patent. The left superficial femoral artery is patent. There is normal take off of the anterior tibial artery on the left side. A left popliteal artery is patent. The anterior tibial artery occludes in the mid leg. The peroneal artery is occluded. The posterior tibial artery is the main runoff vessel to the left foot.  At this point a 5 French pigtail catheter was removed over a guidewire. The 5 French sheath was thoroughly flushed. It was left in place to be pulled in the holding area. The patient tolerated procedure well and there were no complications. The patient was taken to the holding area in stable condition.  Operative findings: #1 no significant renal artery stenosis                                 #2  40% right superficial femoral artery stenosis mid, one-vessel runoff to the right foot via the posterior tibial artery                                 #3 one vessel runoff to the left foot via the posterior  tibial artery                                #4 widely patent right common iliac stent  Operative management: The patient will try a walking program and continue smoking cessation for approximate 6 weeks. She'll return for followup after that. At that time we will consider whether or not to continue conservative management or consider a right femoral to below-knee popliteal artery bypass.  Ruta Hinds, MD [facility] Office: [phone] Pager: [phone]

## 2013-08-12 NOTE — Discharge Instructions (Signed)
Angiography, Care After °Refer to this sheet in the next few weeks. These instructions provide you with information on caring for yourself after your procedure. Your health care provider may also give you more specific instructions. Your treatment has been planned according to current medical practices, but problems sometimes occur. Call your health care provider if you have any problems or questions after your procedure.  °WHAT TO EXPECT AFTER THE PROCEDURE °After your procedure, it is typical to have the following sensations: °· Minor discomfort or tenderness and a small bump at the catheter insertion site. The bump should usually decrease in size and tenderness within 1 to 2 weeks. °· Any bruising will usually fade within 2 to 4 weeks. °HOME CARE INSTRUCTIONS  °· You may need to keep taking blood thinners if they were prescribed for you. Only take over-the-counter or prescription medicines for pain, fever, or discomfort as directed by your health care provider. °· Do not apply powder or lotion to the site. °· Do not sit in a bathtub, swimming pool, or whirlpool for 5 to 7 days. °· You may shower 24 hours after the procedure. Remove the bandage (dressing) and gently wash the site with plain soap and water. Gently pat the site dry. °· Inspect the site at least twice daily. °· Limit your activity for the first 48 hours. Do not bend, squat, or lift anything over 20 lb (9 kg) or as directed by your health care provider. °· Do not drive home if you are discharged the day of the procedure. Have someone else drive you. Follow instructions about when you can drive or return to work. °SEEK MEDICAL CARE IF: °· You get lightheaded when standing up. °· You have drainage (other than a small amount of blood on the dressing). °· You have chills. °· You have a fever. °· You have redness, warmth, swelling, or pain at the insertion site. °SEEK IMMEDIATE MEDICAL CARE IF:  °· You develop chest pain or shortness of breath, feel faint,  or pass out. °· You have bleeding, swelling larger than a walnut, or drainage from the catheter insertion site. °· You develop pain, discoloration, coldness, or severe bruising in the leg or arm that held the catheter. °· You develop bleeding from any other place, such as the bowels. You may see bright red blood in your urine or stools, or your stools may appear black and tarry. °· You have heavy bleeding from the site. If this happens, hold pressure on the site. °MAKE SURE YOU: °· Understand these instructions. °· Will watch your condition. °· Will get help right away if you are not doing well or get worse.  Call 911 if bleeding is not under control °Document Released: 12/19/2004 Document Revised: 02/02/2013 Document Reviewed: 10/25/2012 °Exit[employer]® Patient Information ©2014 [employer]. ° °

## 2013-08-12 NOTE — Progress Notes (Signed)
Received pt from cath procedure alert and denies any discomfort at this time.  Pt able to tolerate fluids. 

## 2013-08-15 ENCOUNTER — Telehealth: Payer: Self-pay | Admitting: Vascular Surgery

## 2013-08-15 ENCOUNTER — Other Ambulatory Visit: Payer: Self-pay | Admitting: *Deleted

## 2013-08-15 DIAGNOSIS — Z48812 Encounter for surgical aftercare following surgery on the circulatory system: Secondary | ICD-10-CM

## 2013-08-15 DIAGNOSIS — I739 Peripheral vascular disease, unspecified: Secondary | ICD-10-CM

## 2013-08-15 NOTE — Telephone Encounter (Addendum)
Message copied by Gena Fray on Mon Aug 15, 2013 11:04 AM ------      Message from: Denman George      Created: Fri Aug 12, 2013 12:50 PM      Regarding: Micheline Rough                   ----- Message -----         From: Elam Dutch, MD         Sent: 08/12/2013   9:58 AM           To: Mignon Pine Pool, [facility] Charge Pool            Aortogram with bilat runoff       Ultrssound groin            She needs a follow up appt in 6 weeks with repeat ABI            Ruta Hinds ------  08/15/13: lm for pt re appt, dpm

## 2013-08-24 ENCOUNTER — Ambulatory Visit (INDEPENDENT_AMBULATORY_CARE_PROVIDER_SITE_OTHER): Payer: Medicare Other | Admitting: Family Medicine

## 2013-08-24 ENCOUNTER — Encounter: Payer: Self-pay | Admitting: Family Medicine

## 2013-08-24 VITALS — BP 160/80 | HR 86 | Temp 99.5°F | Ht 65.0 in | Wt 180.0 lb

## 2013-08-24 DIAGNOSIS — I10 Essential (primary) hypertension: Secondary | ICD-10-CM

## 2013-08-24 DIAGNOSIS — G4700 Insomnia, unspecified: Secondary | ICD-10-CM

## 2013-08-24 MED ORDER — ZOLPIDEM TARTRATE 10 MG PO TABS: 10.0000 mg | ORAL_TABLET | Freq: Every evening | ORAL | Status: DC | PRN

## 2013-08-24 MED ORDER — METOPROLOL TARTRATE 25 MG PO TABS: 25.0000 mg | ORAL_TABLET | Freq: Two times a day (BID) | ORAL | Status: DC

## 2013-08-24 NOTE — Progress Notes (Signed)
   Subjective:    Patient ID: Sydney Phillips, female    DOB: 1936/02/13, 78 y.o.   MRN: [id number]  HPI Here for 2 things. First she has trouble sleeping every night. She tried Temazepam in the past but this did not work. She can sleep through the night if she takes 2 of her Xanax together, but she feels hungover the next day. Also her BP at home has been slowly going up, her systolic readings are often in the 150s or 160s. No chest pain or SOB. She takes 1/2 tablet of Metoprolol bid.    Review of Systems  Constitutional: Negative.   Respiratory: Negative.   Cardiovascular: Negative.        Objective:   Physical Exam  Constitutional: She appears well-developed and well-nourished.  Cardiovascular: Normal rate, regular rhythm, normal heart sounds and intact distal pulses.   Pulmonary/Chest: Effort normal and breath sounds normal.  Musculoskeletal: She exhibits no edema.          Assessment & Plan:  Try Zolpidem for sleep. Increase the Metoprolol to a whole 25 mg tablet bid.

## 2013-08-24 NOTE — Progress Notes (Signed)
Pre visit review using our clinic review tool, if applicable. No additional management support is needed unless otherwise documented below in the visit note. 

## 2013-09-05 ENCOUNTER — Encounter: Payer: Self-pay | Admitting: *Deleted

## 2013-09-28 ENCOUNTER — Encounter: Payer: Self-pay | Admitting: Vascular Surgery

## 2013-09-29 ENCOUNTER — Telehealth: Payer: Self-pay | Admitting: Cardiology

## 2013-09-29 ENCOUNTER — Encounter: Payer: Self-pay | Admitting: Vascular Surgery

## 2013-09-29 ENCOUNTER — Ambulatory Visit (HOSPITAL_COMMUNITY)
Admission: RE | Admit: 2013-09-29 | Discharge: 2013-09-29 | Disposition: A | Discharge status: Home / Self Care | Payer: Medicare Other | Source: Ambulatory Visit | Attending: Vascular Surgery | Admitting: Vascular Surgery

## 2013-09-29 ENCOUNTER — Ambulatory Visit (INDEPENDENT_AMBULATORY_CARE_PROVIDER_SITE_OTHER): Payer: Medicare Other | Admitting: Vascular Surgery

## 2013-09-29 VITALS — BP 109/69 | HR 74 | Resp 16 | Ht 66.5 in | Wt 182.1 lb

## 2013-09-29 DIAGNOSIS — I70219 Atherosclerosis of native arteries of extremities with intermittent claudication, unspecified extremity: Secondary | ICD-10-CM

## 2013-09-29 DIAGNOSIS — I739 Peripheral vascular disease, unspecified: Secondary | ICD-10-CM

## 2013-09-29 DIAGNOSIS — Z48812 Encounter for surgical aftercare following surgery on the circulatory system: Secondary | ICD-10-CM

## 2013-09-29 NOTE — Telephone Encounter (Signed)
Will forward this note to dr fields

## 2013-09-29 NOTE — Telephone Encounter (Signed)
Will forward for dr crenshaw review  

## 2013-09-29 NOTE — Telephone Encounter (Signed)
NEW MESSAGE    OFFICE WILL BE STOP PLAVIX  10 PRIOR TO SURGERY ON 4/29. RIGHT FEM BYPASS GRAFT.

## 2013-09-29 NOTE — Progress Notes (Signed)
[facility] [place]  HISTORY AND PHYSICAL  History of Present Illness: Patient is a 77 y.o. year old female who presents for evaluation of right leg numbness and weakness and claudication symptoms. This is been present for approximately 6 months. She thinks she also has some early symptoms similar to this in the left leg. She denies rest pain. She denies nonhealing wounds on the foot. She states that in the past she thinks she may have had some sort of stent put in her leg by Dr. Berry. I was unable to find details regarding this in her EPIC chart today. She did have a left subclavian stent placed by Dr. Barry several years ago this was done for dizziness. Other medical problems include COPD, hyperlipidemia, coronary disease, hypertension, depression, CHF and has an AICD pacer. She currently smokes half a pack of cigarettes per day. Greater than 3 minutes they spent regarding smoking cessation counseling. She is on Plavix and aspirin. However she stopped her Plavix 10 days ago and has yet to refill her prescription. I recently performed an arteriogram on her which showed significant distal right superficial and popliteal artery occlusive disease. She primarily has peroneal runoff. She has tried conservative measures for the last 6 weeks a walking program and still states that she has difficulty walking and wishes to undergo revascularization at this point. He was explained to her today that she is not at risk of limb loss. Risks benefits and possible complications of the vas relation were explained the patient today including the possibility of increasing her chances of limb loss. Also it was explained to her that durability of any bypass procedure would be limited if she continues to smoke.   Past Medical History   Diagnosis  Date   .  COPD (chronic obstructive pulmonary disease)    .  Hx of colonic polyps    .  Insomnia    .  Fibromyalgia    .  Hyperlipemia      takes Crestor  daily   .  CAD (coronary artery disease)      Currently angina free, no evidence of reversible ischemia   .  Vertigo      takes Meclizine prn   .  Subclavian arterial stenosis, lt, with PTA/STENT 07/31/11  08/01/2011   .  S/P angioplasty with stent, lt. subclavian 07/31/11  08/01/2011   .  Hypertension      takes Amlodipine and Metoprolol daily   .  Bronchitis    .  Arthritis    .  Chronic back pain    .  GERD (gastroesophageal reflux disease)      was on meds but was taken off;now watches what she eats   .  Chronic constipation    .  Hemorrhoids    .  Urinary incontinence    .  Early cataracts, bilateral    .  Anxiety    .  Depression      takes Cymbalta daily   .  Congestive heart failure (CHF)      New York Heart Association functional class 2, diastolic dysfunction   .  PAD (peripheral artery disease)      Carotid, subclavian, and lower extremity beds, currently not symptomatic   .  Syncope  07/28/2011     EF - 50-55, moderate concentric hypertrophy in left ventricle   .  LBBB (left bundle branch block)      Stress test 09/03/2010, EF 55   .    Nephrolithiasis     Past Surgical History   Procedure  Laterality  Date   .  Appendectomy     .  Tonsillectomy     .  Tubal ligation     .  Cardiac defibrillator placement   05/2008     By Dr Soloman, [employer] CANNOT HAVE MRI's   .  Abdominal hysterectomy     .  Back surgery   2012   .  Orif elbow fracture   08/16/2011     Procedure: OPEN REDUCTION INTERNAL FIXATION (ORIF) ELBOW/OLECRANON FRACTURE; Surgeon: Matthew A Weingold, MD; Location: [facility] OR; Service: Orthopedics; Laterality: Left;   .  Cardiac catheterization   12/01/2007     By Dr. Gamble, left heart cath,   .  Coronary angioplasty     .  Subclavian stent placement  Left  07/31/2011     7x18 Genesis, balloon, with reduction of 90% ostial left subclavian artery stenosis to 0% with residual excellent flow   Social History  History   Substance Use Topics   .  Smoking status:   Current Every Day Smoker -- 0.50 packs/day for 60 years     Types:  Cigarettes   .  Smokeless tobacco:  Never Used      Comment: less than 1/2 pack   .  Alcohol Use:  No   Family History  Family History   Problem  Relation  Age of Onset   .  Hyperlipidemia     .  Anesthesia problems  Neg Hx    .  Hypotension  Neg Hx    .  Malignant hyperthermia  Neg Hx    .  Pseudochol deficiency  Neg Hx    .  Colon cancer  Maternal Grandmother    .  Cancer  Sister      ovarian   .  Diabetes  Sister    .  Heart disease  Sister    .  CAD  Father    .  Heart disease  Father    .  Hyperlipidemia  Father    .  Heart disease  Mother    .  Deep vein thrombosis  Son    Allergies  Allergies   Allergen  Reactions   .  Codeine  Itching   .  Darvon  Itching   .  Meloxicam  Other (See Comments)     Unknown   .  Propoxyphene N-Acetaminophen  Itching   .  Rofecoxib  Other (See Comments)     Unknown   .  Azithromycin  Rash   .  Erythromycin  Rash   .  Penicillins  Rash   .  Sulfa Antibiotics  Rash    Current Outpatient Prescriptions   Medication  Sig  Dispense  Refill   .  ALPRAZolam (XANAX) 1 MG tablet  TAKE ONE TABLET BY MOUTH 4 TIMES DAILY  120 tablet  5   .  aspirin 325 MG tablet  Take 325 mg by mouth daily.     .  CYMBALTA 60 MG capsule  TAKE ONE CAPSULE BY MOUTH TWICE DAILY  60 capsule  11   .  furosemide (LASIX) 80 MG tablet  TAKE ONE TABLET BY MOUTH TWICE DAILY  180 tablet  1   .  metoprolol tartrate (LOPRESSOR) 25 MG tablet  12.5 mg 2 (two) times daily.     .  clopidogrel (PLAVIX) 75 MG tablet  Take 1 tablet (75 mg total)   by mouth daily.  30 tablet  11   .  furosemide (LASIX) 80 MG tablet  Take 1 tablet (80 mg total) by mouth daily.  180 tablet  1   .  HYDROcodone-acetaminophen (NORCO) 10-325 MG per tablet  Take 1 tablet by mouth every 6 (six) hours as needed for pain.  60 tablet  0   .  potassium chloride (KLOR-CON 10) 10 MEQ tablet  Take 1 tablet (10 mEq total) by mouth 2 (two) times  daily.  60 tablet  11    No current facility-administered medications for this visit.   ROS:  General: No weight loss, Fever, chills  HEENT: No recent headaches, no nasal bleeding, no visual changes, no sore throat  Neurologic: + dizziness, blackouts, seizures. No recent symptoms of stroke or mini- stroke. No recent episodes of slurred speech, or temporary blindness.  Cardiac: No recent episodes of chest pain/pressure, no shortness of breath at rest. + shortness of breath with exertion. Denies history of atrial fibrillation or irregular heartbeat  Vascular: No history of rest pain in feet. +history of claudication. No history of non-healing ulcer, No history of DVT  Pulmonary: No home oxygen, no productive cough, no hemoptysis, No asthma or wheezing  Musculoskeletal: [ ]  Arthritis, [x ] Low back pain, [ ]  Joint pain  Hematologic:No history of hypercoagulable state. No history of easy bleeding. No history of anemia  Gastrointestinal: No hematochezia or melena, + gastroesophageal reflux, no trouble swallowing  Urinary: [ ]  chronic Kidney disease, [ ]  on HD - [ ]  MWF or [ ]  TTHS, [ ]  Burning with urination, [ ]  Frequent urination, [ ]  Difficulty urinating;  Skin: No rashes  Psychological: No history of anxiety, + history of depression    Physical Examination  Filed Vitals:   09/29/13 1200  BP: 109/69  Pulse: 74  Resp: 16  Height: 5' 6.5" (1.689 m)  Weight: 182 lb 1.6 oz (82.6 kg)    Body mass index is 29.36 kg/(m^2).  General: Alert and oriented, no acute distress  HEENT: Normal  Neck: No bruit or JVD  Pulmonary: Clear to auscultation bilaterally  Cardiac: Regular Rate and Rhythm without murmur  Abdomen: Soft, non-tender, non-distended, no mass  Skin: No rash  Extremity Pulses: 2+ radial, brachial right side absent left radial pulse 2+ left brachial pulse, 2+ femoral, absent dorsalis pedis, posterior tibial pulses bilaterally  Musculoskeletal: No deformity or edema  Neurologic:  Upper and lower extremity motor 5/5 and symmetric   DATA: I reviewed the patient's recent arteriogram today. She has high take off of the tibial vessels. Primary runoff vessel with posterior tibial artery. She has superficial femoral and popliteal artery occlusive disease. The patient had a noninvasive arterial exam dated December 22 which I reviewed today. She an ABI on the right 0.47 left 0.93 this suggested a right popliteal artery stenosis   ASSESSMENT: Claudication right lower extremity lifestyle limiting   PLAN: Smoking cessation was emphasized. The patient wishes to have a bypass procedure at this point. We will stop her Plavix for 10 days prior to her bypass. She will continue her aspirin. Her procedure is scheduled for 10/12/2013.  Ruta Hinds, MD  [facility] [place]  Office: [phone]  Pager: [phone]

## 2013-09-29 NOTE — Telephone Encounter (Signed)
Ok to [employer] plavix prior 10 days prior to procedure and resume after Lelon Perla

## 2013-10-03 ENCOUNTER — Encounter: Payer: Self-pay | Admitting: Nurse Practitioner

## 2013-10-03 ENCOUNTER — Ambulatory Visit (INDEPENDENT_AMBULATORY_CARE_PROVIDER_SITE_OTHER): Payer: Medicare Other | Admitting: Nurse Practitioner

## 2013-10-03 ENCOUNTER — Telehealth: Payer: Self-pay | Admitting: Family Medicine

## 2013-10-03 ENCOUNTER — Other Ambulatory Visit: Payer: Self-pay | Admitting: Family Medicine

## 2013-10-03 VITALS — BP 120/80 | HR 72 | Ht 66.0 in | Wt 180.8 lb

## 2013-10-03 DIAGNOSIS — Z01818 Encounter for other preprocedural examination: Secondary | ICD-10-CM

## 2013-10-03 DIAGNOSIS — I739 Peripheral vascular disease, unspecified: Secondary | ICD-10-CM

## 2013-10-03 DIAGNOSIS — I2510 Atherosclerotic heart disease of native coronary artery without angina pectoris: Secondary | ICD-10-CM

## 2013-10-03 DIAGNOSIS — I429 Cardiomyopathy, unspecified: Secondary | ICD-10-CM

## 2013-10-03 DIAGNOSIS — I428 Other cardiomyopathies: Secondary | ICD-10-CM

## 2013-10-03 LAB — BASIC METABOLIC PANEL
BUN: 23 mg/dL (ref 6–23)
CO2: 27 mEq/L (ref 19–32)
Calcium: 9.7 mg/dL (ref 8.4–10.5)
Chloride: 98 mEq/L (ref 96–112)
Creatinine, Ser: 1.1 mg/dL (ref 0.4–1.2)
GFR: 49.8 mL/min — ABNORMAL LOW (ref >60.00)
Glucose, Bld: 100 mg/dL — ABNORMAL HIGH (ref 70–99)
Potassium: 3.7 mEq/L (ref 3.5–5.1)
Sodium: 137 mEq/L (ref 135–145)

## 2013-10-03 NOTE — Telephone Encounter (Signed)
Pt needs new rx hydrocodone °

## 2013-10-03 NOTE — Progress Notes (Signed)
Sydney Phillips Date of Birth: 1936/04/25 Medical Record #[id number]  History of Present Illness: Sydney Phillips is a 78 year old female seen today for a preoperative evaluation. Seen for Dr. Stanford Breed. She has been followed by Dr. Gwenlyn Found previously. Patient has a history of cardiomyopathy that was felt to be mixed. Her ejection fraction in 2009 was 20%. Catheterization revealed a 75% LAD and a 75% right coronary artery. She had a biventricular ICD placed at that time. Last echocardiogram in February of 2013 showed an ejection fraction of 62-95%, grade 1 diastolic dysfunction and mild mitral regurgitation. Myoview in March of 2012 showed an ejection fraction of 55% and normal perfusion. Patient also has peripheral vascular disease. Arteriogram in February 2013 showed a 70% right internal carotid artery stenosis. There was a 30-40% left carotid stenosis. There was a 90% left subclavian. Patient had stent of her left subclavian at that time.  Seen by Dr. Stanford Breed last in September - this was her first visit with him. Patient had been found to have polyps that required surgical resection. Myoview was obtained. This was satisfactory.   Comes back today. Here with her son, Jeneen Rinks. Needs right FPBG per Dr. Oneida Alar. Continues to smoke. No chest pain. Stable shortness of breath. No syncope. Has already been advised about holding her Plavix 10 days prior to her surgery.  Still smoking - not interested in stopping. Limited by pain in her right leg with walking. Is taking more potassium but does not look like this has been rechecked.   Current Outpatient Prescriptions  Medication Sig Dispense Refill  . ALPRAZolam (XANAX) 1 MG tablet Take 1 mg by mouth 4 (four) times daily as needed for anxiety.      Marland Kitchen aspirin 325 MG tablet Take 81.25 mg by mouth daily. Take 1/4 tablet daily.      . clopidogrel (PLAVIX) 75 MG tablet Take 1 tablet (75 mg total) by mouth daily.  30 tablet  11  . DULoxetine (CYMBALTA) 60 MG capsule  Take 60 mg by mouth 2 (two) times daily.      . furosemide (LASIX) 80 MG tablet Take 80 mg by mouth 2 (two) times daily.      Marland Kitchen HYDROcodone-acetaminophen (NORCO) 10-325 MG per tablet Take 1 tablet by mouth every 6 (six) hours as needed for moderate pain.  120 tablet  0  . lisinopril (PRINIVIL,ZESTRIL) 20 MG tablet Take 20 mg by mouth daily.      . metoprolol tartrate (LOPRESSOR) 25 MG tablet Take 25 mg by mouth daily.      . potassium chloride (KLOR-CON) 20 MEQ packet Take by mouth 2 (two) times daily.      Marland Kitchen zolpidem (AMBIEN) 10 MG tablet Take 1 tablet (10 mg total) by mouth at bedtime as needed for sleep.  30 tablet  5   No current facility-administered medications for this visit.    Allergies  Allergen Reactions  . Codeine Itching  . Darvon Itching  . Meloxicam Other (See Comments)    Unknown    . Potassium-Containing Compounds Other (See Comments)    Causes severe constipation  . Propoxyphene N-Acetaminophen Itching  . Rofecoxib Other (See Comments)    Unknown    . Azithromycin Rash  . Erythromycin Rash  . Penicillins Rash  . Sulfa Antibiotics Rash    Past Medical History  Diagnosis Date  . COPD (chronic obstructive pulmonary disease)   . Hx of colonic polyps   . Insomnia   . Fibromyalgia   . Hyperlipemia  takes Crestor daily  . CAD (coronary artery disease)     Currently angina free, no evidence of reversible ischemia  . Vertigo     takes Meclizine prn  . Subclavian arterial stenosis, lt, with PTA/STENT 07/31/11 08/01/2011  . S/P angioplasty with stent, lt. subclavian 07/31/11 08/01/2011  . Hypertension     takes Amlodipine and Metoprolol daily  . Bronchitis   . Arthritis   . Chronic back pain   . GERD (gastroesophageal reflux disease)     was on meds but was taken off;now watches what she eats  . Chronic constipation   . Hemorrhoids   . Urinary incontinence   . Early cataracts, bilateral   . Anxiety   . Depression     takes Cymbalta daily  . Congestive  heart failure (CHF)     New York Heart Association functional class 2, diastolic dysfunction  . PAD (peripheral artery disease)     Carotid, subclavian, and lower extremity beds, currently not symptomatic  . Syncope 07/28/2011    EF - 50-55, moderate concentric hypertrophy in left ventricle  . LBBB (left bundle branch block)     Stress test 09/03/2010, EF 55  . Nephrolithiasis     Past Surgical History  Procedure Laterality Date  . Appendectomy    . Tonsillectomy    . Tubal ligation    . Cardiac defibrillator placement  05/2008    By Dr Blanch Media, [employer] CANNOT HAVE MRI's  . Abdominal hysterectomy    . Back surgery  2012  . Orif elbow fracture  08/16/2011    Procedure: OPEN REDUCTION INTERNAL FIXATION (ORIF) ELBOW/OLECRANON FRACTURE;  Surgeon: Schuyler Amor, MD;  Location: [facility];  Service: Orthopedics;  Laterality: Left;  . Cardiac catheterization  12/01/2007    By Dr. Melvern Banker, left heart cath,   . Coronary angioplasty    . Subclavian stent placement Left 07/31/2011    7x18 Genesis, balloon, with reduction of 90% ostial left subclavian artery stenosis to 0% with residual excellent flow    History  Smoking status  . Current Every Day Smoker -- 0.50 packs/day for 60 years  . Types: Cigarettes  Smokeless tobacco  . Never Used    Comment: 3 cigarettes per day    History  Alcohol Use No    Family History  Problem Relation Age of Onset  . Hyperlipidemia    . Anesthesia problems Neg Hx   . Hypotension Neg Hx   . Malignant hyperthermia Neg Hx   . Pseudochol deficiency Neg Hx   . Colon cancer Maternal Grandmother   . Cancer Sister     ovarian  . Diabetes Sister   . Heart disease Sister   . CAD Father   . Heart disease Father   . Hyperlipidemia Father   . Heart disease Mother   . Deep vein thrombosis Son     Review of Systems: The review of systems is per the HPI.  All other systems were reviewed and are negative.  Physical Exam: BP 120/80  Pulse 72  Ht 5\' 6"   (1.676 m)  Wt 180 lb 12.8 oz (82.01 kg)  BMI 29.20 kg/m2 Patient is alert and in no acute distress. Skin is warm and dry. Color is normal.  HEENT is unremarkable but many missing teeth. Normocephalic/atraumatic. PERRL. Sclera are nonicteric. Neck is supple. No masses. No JVD. Lungs are coarse. Cardiac exam shows a regular rate and rhythm. Abdomen is soft. Extremities are without edema. Gait and ROM are intact.  No gross neurologic deficits noted.  LABORATORY DATA: EKG sinus with V pacing.    Lab Results  Component Value Date   WBC 7.2 05/05/2012   HGB 14.6 08/12/2013   HCT 43.0 08/12/2013   PLT 341.0 05/05/2012   GLUCOSE 126* 08/12/2013   CHOL 225* 07/30/2011   TRIG 256* 07/30/2011   HDL 29* 07/30/2011   LDLCALC 145* 07/30/2011   ALT 11 07/31/2011   AST 14 07/31/2011   NA 143 08/12/2013   K 2.4* 08/12/2013   CL 98 08/12/2013   CREATININE 0.90 08/12/2013   BUN 10 08/12/2013   CO2 37* 04/20/2013   TSH 1.06 05/05/2012   INR 1.03 07/31/2011    Myoview Impression from October 2014  Exercise Capacity: Lexiscan with no exercise.  BP Response: Normal blood pressure response.  Clinical Symptoms: No significant symptoms noted.  ECG Impression: No significant ST segment change suggestive of ischemia.  Comparison with Prior Nuclear Study: No images to compare  Overall Impression: Normal stress nuclear study.  LV Ejection Fraction: 60%. LV Wall Motion: NL LV Function; NL Wall Motion  Jenkins Rouge     Assessment / Plan: 1. Surgical clearance - no cardiac symptoms. Negative Myoview back in October. Dr. Stanford Breed has already advised stopping the Plavix 10 days prior. Will be available if problems arise.   2. CAD - no active symptoms. See back in 6 months.   3. Prior cardiomyopathy - felt to be mixed - EF has normalized - recheck BMET today. Looks compensated.   4. Underlying biV/ICD  5. Ongoing tobacco abuse - not ready to stop.   6. PVD  Patient is agreeable to this plan and will call if any  problems develop in the interim.   [name], RN, [place] [address] [place], [place]  [id number] [phone]

## 2013-10-03 NOTE — Patient Instructions (Addendum)
See Dr. Stanford Breed in 6 months  We will check your potassium level today  Stay on your current medicines but ok to stop your Plavix now in preparation for your upcoming surgery.  Call the [employer] office at [phone] if you have any questions, problems or concerns.

## 2013-10-04 ENCOUNTER — Telehealth: Payer: Self-pay | Admitting: Family Medicine

## 2013-10-04 ENCOUNTER — Other Ambulatory Visit: Payer: Self-pay | Admitting: *Deleted

## 2013-10-04 MED ORDER — HYDROCODONE-ACETAMINOPHEN 10-325 MG PO TABS: 1.0000 | ORAL_TABLET | Freq: Four times a day (QID) | ORAL | Status: DC | PRN

## 2013-10-04 MED ORDER — ALPRAZOLAM 1 MG PO TABS: ORAL_TABLET | ORAL | Status: DC

## 2013-10-04 NOTE — Telephone Encounter (Signed)
Call in #120 with 5 rf 

## 2013-10-04 NOTE — Telephone Encounter (Signed)
Relevant patient education mailed to patient.  

## 2013-10-04 NOTE — Telephone Encounter (Signed)
The rx for Vicodin is ready, also call in Xanax #120 with 5 rf

## 2013-10-04 NOTE — Telephone Encounter (Signed)
Script ready for pick up, called in the Xanax and I spoke with pt.

## 2013-10-04 NOTE — Telephone Encounter (Signed)
Pt also request ALPRAZolam (XANAX) 1 MG tablet [employer]  Pt also states she is going in the hospital next week for bypass surgery and that's what she needs the hydrocodone for.

## 2013-10-05 ENCOUNTER — Ambulatory Visit (INDEPENDENT_AMBULATORY_CARE_PROVIDER_SITE_OTHER): Payer: Medicare Other | Admitting: *Deleted

## 2013-10-05 ENCOUNTER — Encounter: Payer: Self-pay | Admitting: Internal Medicine

## 2013-10-05 ENCOUNTER — Encounter (HOSPITAL_COMMUNITY): Payer: Self-pay | Admitting: Pharmacy Technician

## 2013-10-05 DIAGNOSIS — I429 Cardiomyopathy, unspecified: Secondary | ICD-10-CM

## 2013-10-05 DIAGNOSIS — I428 Other cardiomyopathies: Secondary | ICD-10-CM

## 2013-10-05 LAB — MDC_IDC_ENUM_SESS_TYPE_REMOTE
Battery Voltage: 3.1 V
Brady Statistic AP VP Percent: 0.3 %
Brady Statistic AP VS Percent: 0.1 %
Brady Statistic AS VS Percent: 0.1 %
Lead Channel Impedance Value: 1026 Ohm
Lead Channel Impedance Value: 494 Ohm
Lead Channel Impedance Value: 494 Ohm
Lead Channel Impedance Value: 494 Ohm
Lead Channel Pacing Threshold Amplitude: 0.625 V
Lead Channel Pacing Threshold Amplitude: 1.625 V
Lead Channel Pacing Threshold Pulse Width: 0.4 ms
Lead Channel Pacing Threshold Pulse Width: 0.4 ms
Lead Channel Setting Pacing Amplitude: 2 V
Lead Channel Setting Pacing Amplitude: 2.5 V
Lead Channel Setting Pacing Pulse Width: 1 ms
Lead Channel Setting Sensing Sensitivity: 0.3 mV
MDC IDC MSMT LEADCHNL LV PACING THRESHOLD PULSEWIDTH: 1 ms
MDC IDC MSMT LEADCHNL RA PACING THRESHOLD AMPLITUDE: 0.375 V
MDC IDC MSMT LEADCHNL RA SENSING INTR AMPL: 3.9 mV
MDC IDC MSMT LEADCHNL RV SENSING INTR AMPL: 20 mV
MDC IDC SET LEADCHNL RA PACING AMPLITUDE: 1.5 V
MDC IDC SET LEADCHNL RV PACING PULSEWIDTH: 0.4 ms
MDC IDC SET ZONE DETECTION INTERVAL: 320 ms
MDC IDC STAT BRADY AS VP PERCENT: 99.6 %
Zone Setting Detection Interval: 350 ms
Zone Setting Detection Interval: 360 ms
Zone Setting Detection Interval: 360 ms

## 2013-10-07 ENCOUNTER — Encounter (HOSPITAL_COMMUNITY)
Admission: RE | Admit: 2013-10-07 | Discharge: 2013-10-07 | Disposition: A | Discharge status: Home / Self Care | Payer: Medicare Other | Source: Ambulatory Visit | Attending: Vascular Surgery | Admitting: Vascular Surgery

## 2013-10-07 ENCOUNTER — Encounter (HOSPITAL_COMMUNITY)
Admission: RE | Admit: 2013-10-07 | Discharge: 2013-10-07 | Disposition: A | Discharge status: Home / Self Care | Payer: Medicare Other | Source: Ambulatory Visit | Attending: Anesthesiology | Admitting: Anesthesiology

## 2013-10-07 ENCOUNTER — Encounter (HOSPITAL_COMMUNITY): Payer: Self-pay

## 2013-10-07 DIAGNOSIS — Z01818 Encounter for other preprocedural examination: Secondary | ICD-10-CM | POA: Insufficient documentation

## 2013-10-07 DIAGNOSIS — Z01812 Encounter for preprocedural laboratory examination: Secondary | ICD-10-CM | POA: Insufficient documentation

## 2013-10-07 HISTORY — DX: Presence of automatic (implantable) cardiac defibrillator: Z95.810

## 2013-10-07 LAB — PROTIME-INR
INR: 0.95 (ref 0.00–1.49)
PROTHROMBIN TIME: 12.5 s (ref 11.6–15.2)

## 2013-10-07 LAB — COMPREHENSIVE METABOLIC PANEL
ALBUMIN: 4.1 g/dL (ref 3.5–5.2)
ALK PHOS: 94 U/L (ref 39–117)
ALT: 14 U/L (ref 0–35)
AST: 20 U/L (ref 0–37)
BUN: 19 mg/dL (ref 6–23)
CALCIUM: 9.9 mg/dL (ref 8.4–10.5)
CO2: 24 mEq/L (ref 19–32)
Chloride: 103 mEq/L (ref 96–112)
Creatinine, Ser: 0.98 mg/dL (ref 0.50–1.10)
GFR calc Af Amer: 64 mL/min — ABNORMAL LOW (ref >90)
GFR calc non Af Amer: 55 mL/min — ABNORMAL LOW (ref >90)
GLUCOSE: 109 mg/dL — AB (ref 70–99)
Potassium: 4.5 mEq/L (ref 3.7–5.3)
Sodium: 142 mEq/L (ref 137–147)
TOTAL PROTEIN: 7.7 g/dL (ref 6.0–8.3)
Total Bilirubin: <0.2 mg/dL — ABNORMAL LOW (ref 0.3–1.2)

## 2013-10-07 LAB — URINALYSIS, ROUTINE W REFLEX MICROSCOPIC
BILIRUBIN URINE: NEGATIVE
GLUCOSE, UA: NEGATIVE mg/dL
Hgb urine dipstick: NEGATIVE
Ketones, ur: NEGATIVE mg/dL
LEUKOCYTES UA: NEGATIVE
Nitrite: NEGATIVE
PH: 5 (ref 5.0–8.0)
Protein, ur: NEGATIVE mg/dL
Specific Gravity, Urine: 1.017 (ref 1.005–1.030)
Urobilinogen, UA: 0.2 mg/dL (ref 0.0–1.0)

## 2013-10-07 LAB — TYPE AND SCREEN
ABO/RH(D): B POS
Antibody Screen: NEGATIVE

## 2013-10-07 LAB — CBC
HCT: 44.6 % (ref 36.0–46.0)
Hemoglobin: 15.4 g/dL — ABNORMAL HIGH (ref 12.0–15.0)
MCH: 31.1 pg (ref 26.0–34.0)
MCHC: 34.5 g/dL (ref 30.0–36.0)
MCV: 90.1 fL (ref 78.0–100.0)
PLATELETS: 338 10*3/uL (ref 150–400)
RBC: 4.95 MIL/uL (ref 3.87–5.11)
RDW: 13.6 % (ref 11.5–15.5)
WBC: 10 10*3/uL (ref 4.0–10.5)

## 2013-10-07 LAB — SURGICAL PCR SCREEN
MRSA, PCR: NEGATIVE
STAPHYLOCOCCUS AUREUS: NEGATIVE

## 2013-10-07 LAB — APTT: APTT: 33 s (ref 24–37)

## 2013-10-07 NOTE — Progress Notes (Addendum)
Primary: Dr. Sharlene Motts at [facility] Cardiologist: Dr Stanford Breed and Dr. Sallyanne Kuster manages pacemaker/ICD  Faxed Implanted Cardiac Device sheet to Dr. Victorino December office ([facility])  Called the toll free number for [employer] left message of pt's surgery date and time. Representative stated they will give message to [facility] and for her to call me back for confirmation.

## 2013-10-07 NOTE — Pre-Procedure Instructions (Signed)
ORLENE SALMONS  10/07/2013   Your procedure is scheduled on: Wednesday, April 29  Report to [facility] Main Entrance "A"t  6:30 AM.  Call this number if you have problems the morning of surgery: [phone]   Remember:   Do not eat food or drink liquids after midnight.   Take these medicines the morning of surgery with A SIP OF WATER: xanax, aspirin,  Cymbalta,hydrocodone, metoprolol   Do not wear jewelry, make-up or nail polish.  Do not wear lotions, powders, or perfumes. You may wear deodorant.  Do not shave 48 hours prior to surgery. Men may shave face and neck.  Do not bring valuables to the hospital.  [facility] is not responsible                  for any belongings or valuables.               Contacts, dentures or bridgework may not be worn into surgery.  Leave suitcase in the car. After surgery it may be brought to your room.  For patients admitted to the hospital, discharge time is determined by your                treatment team.               Patients discharged the day of surgery will not be allowed to drive  home.  Name and phone number of your driver:   Special Instructions: review preparing for surgery   Please read over the following fact sheets that you were given: Pain Booklet, Coughing and Deep Breathing, Blood Transfusion Information, MRSA Information and Surgical Site Infection Prevention

## 2013-10-10 ENCOUNTER — Encounter: Payer: Medicare Other | Admitting: Cardiovascular Disease

## 2013-10-10 NOTE — Consult Note (Signed)
Anesthesiology Chart Review:  78 year old female scheduled for right femoropopliteal bypass graft on 10/12/2013 by Dr. Oneida Alar.  Problems:  1. Ischemic Cardiomyopathy treated with CRT/AICD. Last Lexiscan nuclear stress 01/25/13 No ischemia. EF 55%, Last ECHO 07/28/11 EF 50-55%. Mild DOE. No angina, Underlying LBBB 2. AICD: [employer] last generator change 02/20/12. No previous discharges.  3. PVD: L. Subclavian stent 07/31/2011. R. Sup femoral and popliteal stenosis with claudication 4. Smoker 5.Hypertension 6. Fibromyagia 7. GERD  Labs OK.   Since procedure is below umbilicus, patient will not need AICD deactivation or magnet placement.  Roberts Gaudy

## 2013-10-11 MED ORDER — CHLORHEXIDINE GLUCONATE 4 % EX LIQD
60.0000 mL | Freq: Once | CUTANEOUS | Status: DC
  Filled 2013-10-11: qty 60

## 2013-10-11 MED ORDER — VANCOMYCIN HCL IN DEXTROSE 1-5 GM/200ML-% IV SOLN
1000.0000 mg | INTRAVENOUS | Status: AC
  Administered 2013-10-12: 1000 mg via INTRAVENOUS
  Filled 2013-10-11: qty 200

## 2013-10-12 ENCOUNTER — Inpatient Hospital Stay (HOSPITAL_COMMUNITY): Payer: Medicare Other | Admitting: Anesthesiology

## 2013-10-12 ENCOUNTER — Encounter (HOSPITAL_COMMUNITY): Payer: Self-pay | Admitting: *Deleted

## 2013-10-12 ENCOUNTER — Telehealth: Payer: Self-pay | Admitting: Vascular Surgery

## 2013-10-12 ENCOUNTER — Inpatient Hospital Stay (HOSPITAL_COMMUNITY): Payer: Medicare Other

## 2013-10-12 ENCOUNTER — Encounter (HOSPITAL_COMMUNITY): Payer: Medicare Other | Admitting: Anesthesiology

## 2013-10-12 ENCOUNTER — Inpatient Hospital Stay (HOSPITAL_COMMUNITY)
Admission: RE | Admit: 2013-10-12 | Discharge: 2013-10-16 | DRG: 254 | Disposition: A | Discharge status: Home / Self Care | Payer: Medicare Other | Source: Ambulatory Visit | Attending: Vascular Surgery | Admitting: Vascular Surgery

## 2013-10-12 ENCOUNTER — Encounter (HOSPITAL_COMMUNITY)
Admission: RE | Disposition: A | Discharge status: Home / Self Care | Payer: Self-pay | Source: Ambulatory Visit | Attending: Vascular Surgery

## 2013-10-12 DIAGNOSIS — I739 Peripheral vascular disease, unspecified: Principal | ICD-10-CM | POA: Diagnosis present

## 2013-10-12 DIAGNOSIS — J449 Chronic obstructive pulmonary disease, unspecified: Secondary | ICD-10-CM | POA: Diagnosis present

## 2013-10-12 DIAGNOSIS — Z7902 Long term (current) use of antithrombotics/antiplatelets: Secondary | ICD-10-CM

## 2013-10-12 DIAGNOSIS — Z833 Family history of diabetes mellitus: Secondary | ICD-10-CM

## 2013-10-12 DIAGNOSIS — Z860100 Personal history of colon polyps, unspecified: Secondary | ICD-10-CM

## 2013-10-12 DIAGNOSIS — Z800 Family history of malignant neoplasm of digestive organs: Secondary | ICD-10-CM

## 2013-10-12 DIAGNOSIS — I10 Essential (primary) hypertension: Secondary | ICD-10-CM | POA: Diagnosis present

## 2013-10-12 DIAGNOSIS — J4489 Other specified chronic obstructive pulmonary disease: Secondary | ICD-10-CM | POA: Diagnosis present

## 2013-10-12 DIAGNOSIS — F3289 Other specified depressive episodes: Secondary | ICD-10-CM | POA: Diagnosis present

## 2013-10-12 DIAGNOSIS — I509 Heart failure, unspecified: Secondary | ICD-10-CM | POA: Diagnosis present

## 2013-10-12 DIAGNOSIS — Z7982 Long term (current) use of aspirin: Secondary | ICD-10-CM

## 2013-10-12 DIAGNOSIS — Z8249 Family history of ischemic heart disease and other diseases of the circulatory system: Secondary | ICD-10-CM

## 2013-10-12 DIAGNOSIS — F17200 Nicotine dependence, unspecified, uncomplicated: Secondary | ICD-10-CM | POA: Diagnosis present

## 2013-10-12 DIAGNOSIS — E785 Hyperlipidemia, unspecified: Secondary | ICD-10-CM | POA: Diagnosis present

## 2013-10-12 DIAGNOSIS — I70219 Atherosclerosis of native arteries of extremities with intermittent claudication, unspecified extremity: Secondary | ICD-10-CM

## 2013-10-12 DIAGNOSIS — K219 Gastro-esophageal reflux disease without esophagitis: Secondary | ICD-10-CM | POA: Diagnosis present

## 2013-10-12 DIAGNOSIS — F329 Major depressive disorder, single episode, unspecified: Secondary | ICD-10-CM | POA: Diagnosis present

## 2013-10-12 DIAGNOSIS — Z95810 Presence of automatic (implantable) cardiac defibrillator: Secondary | ICD-10-CM

## 2013-10-12 HISTORY — PX: FEMORAL-POPLITEAL BYPASS GRAFT: SHX937

## 2013-10-12 HISTORY — PX: INTRAOPERATIVE ARTERIOGRAM: SHX5157

## 2013-10-12 LAB — CBC
HEMATOCRIT: 35.8 % — AB (ref 36.0–46.0)
Hemoglobin: 12 g/dL (ref 12.0–15.0)
MCH: 30.2 pg (ref 26.0–34.0)
MCHC: 33.5 g/dL (ref 30.0–36.0)
MCV: 89.9 fL (ref 78.0–100.0)
Platelets: 276 10*3/uL (ref 150–400)
RBC: 3.98 MIL/uL (ref 3.87–5.11)
RDW: 13.6 % (ref 11.5–15.5)
WBC: 17.5 10*3/uL — AB (ref 4.0–10.5)

## 2013-10-12 LAB — CREATININE, SERUM
Creatinine, Ser: 1.02 mg/dL (ref 0.50–1.10)
GFR calc non Af Amer: 52 mL/min — ABNORMAL LOW (ref >90)
GFR, EST AFRICAN AMERICAN: 61 mL/min — AB (ref >90)

## 2013-10-12 SURGERY — BYPASS GRAFT FEMORAL-POPLITEAL ARTERY
Anesthesia: General | Site: Leg Upper | Laterality: Right

## 2013-10-12 MED ORDER — NEOSTIGMINE METHYLSULFATE 1 MG/ML IJ SOLN
INTRAMUSCULAR | Status: DC | PRN
  Administered 2013-10-12: 3 mg via INTRAVENOUS

## 2013-10-12 MED ORDER — VANCOMYCIN HCL IN DEXTROSE 1-5 GM/200ML-% IV SOLN
1000.0000 mg | Freq: Two times a day (BID) | INTRAVENOUS | Status: DC
  Filled 2013-10-12 (×2): qty 200

## 2013-10-12 MED ORDER — DOPAMINE-DEXTROSE 3.2-5 MG/ML-% IV SOLN: 3.0000 ug/kg/min | INTRAVENOUS | Status: DC

## 2013-10-12 MED ORDER — VANCOMYCIN HCL IN DEXTROSE 750-5 MG/150ML-% IV SOLN
750.0000 mg | Freq: Two times a day (BID) | INTRAVENOUS | Status: AC
  Administered 2013-10-12 – 2013-10-13 (×2): 750 mg via INTRAVENOUS
  Filled 2013-10-12 (×2): qty 150

## 2013-10-12 MED ORDER — ASPIRIN EC 81 MG PO TBEC
81.0000 mg | DELAYED_RELEASE_TABLET | Freq: Every day | ORAL | Status: DC
  Administered 2013-10-14 – 2013-10-16 (×2): 81 mg via ORAL
  Filled 2013-10-12 (×5): qty 1

## 2013-10-12 MED ORDER — PROTAMINE SULFATE 10 MG/ML IV SOLN
INTRAVENOUS | Status: AC
  Filled 2013-10-12: qty 10

## 2013-10-12 MED ORDER — HEPARIN SODIUM (PORCINE) 1000 UNIT/ML IJ SOLN
INTRAMUSCULAR | Status: DC | PRN
  Administered 2013-10-12: 9000 [IU] via INTRAVENOUS

## 2013-10-12 MED ORDER — THROMBIN 20000 UNITS EX SOLR
CUTANEOUS | Status: AC
  Filled 2013-10-12: qty 20000

## 2013-10-12 MED ORDER — HYDROCODONE-ACETAMINOPHEN 10-325 MG PO TABS
1.0000 | ORAL_TABLET | Freq: Four times a day (QID) | ORAL | Status: DC | PRN
  Administered 2013-10-13 – 2013-10-15 (×4): 1 via ORAL
  Filled 2013-10-12 (×4): qty 1

## 2013-10-12 MED ORDER — SODIUM CHLORIDE 0.9 % IR SOLN
Status: DC | PRN
  Administered 2013-10-12: 10:00:00

## 2013-10-12 MED ORDER — GLYCOPYRROLATE 0.2 MG/ML IJ SOLN
INTRAMUSCULAR | Status: DC | PRN
  Administered 2013-10-12: 0.4 mg via INTRAVENOUS

## 2013-10-12 MED ORDER — MIDAZOLAM HCL 5 MG/5ML IJ SOLN
INTRAMUSCULAR | Status: DC | PRN
  Administered 2013-10-12 (×2): 1 mg via INTRAVENOUS

## 2013-10-12 MED ORDER — GLYCOPYRROLATE 0.2 MG/ML IJ SOLN
INTRAMUSCULAR | Status: AC
  Filled 2013-10-12: qty 3

## 2013-10-12 MED ORDER — PNEUMOCOCCAL VAC POLYVALENT 25 MCG/0.5ML IJ INJ
0.5000 mL | INJECTION | INTRAMUSCULAR | Status: AC
  Administered 2013-10-13: 0.5 mL via INTRAMUSCULAR
  Filled 2013-10-12: qty 0.5

## 2013-10-12 MED ORDER — FENTANYL CITRATE 0.05 MG/ML IJ SOLN
INTRAMUSCULAR | Status: DC | PRN
  Administered 2013-10-12 (×5): 50 ug via INTRAVENOUS

## 2013-10-12 MED ORDER — FUROSEMIDE 80 MG PO TABS
80.0000 mg | ORAL_TABLET | Freq: Two times a day (BID) | ORAL | Status: DC
  Administered 2013-10-12 – 2013-10-16 (×8): 80 mg via ORAL
  Filled 2013-10-12 (×10): qty 1

## 2013-10-12 MED ORDER — PROPOFOL 10 MG/ML IV BOLUS
INTRAVENOUS | Status: AC
  Filled 2013-10-12: qty 20

## 2013-10-12 MED ORDER — HEPARIN SODIUM (PORCINE) 1000 UNIT/ML IJ SOLN
INTRAMUSCULAR | Status: AC
  Filled 2013-10-12: qty 2

## 2013-10-12 MED ORDER — 0.9 % SODIUM CHLORIDE (POUR BTL) OPTIME
TOPICAL | Status: DC | PRN
  Administered 2013-10-12: 2000 mL

## 2013-10-12 MED ORDER — ONDANSETRON HCL 4 MG/2ML IJ SOLN
INTRAMUSCULAR | Status: DC | PRN
  Administered 2013-10-12: 4 mg via INTRAVENOUS

## 2013-10-12 MED ORDER — ROCURONIUM BROMIDE 100 MG/10ML IV SOLN
INTRAVENOUS | Status: DC | PRN
  Administered 2013-10-12: 50 mg via INTRAVENOUS

## 2013-10-12 MED ORDER — ACETAMINOPHEN 325 MG PO TABS
325.0000 mg | ORAL_TABLET | ORAL | Status: DC | PRN
  Administered 2013-10-14 – 2013-10-15 (×2): 650 mg via ORAL
  Filled 2013-10-12 (×2): qty 2

## 2013-10-12 MED ORDER — SUCCINYLCHOLINE CHLORIDE 20 MG/ML IJ SOLN
INTRAMUSCULAR | Status: AC
  Filled 2013-10-12: qty 1

## 2013-10-12 MED ORDER — OXYCODONE HCL 5 MG/5ML PO SOLN: 5.0000 mg | Freq: Once | ORAL | Status: AC | PRN

## 2013-10-12 MED ORDER — METOCLOPRAMIDE HCL 5 MG/ML IJ SOLN: 10.0000 mg | Freq: Once | INTRAMUSCULAR | Status: DC | PRN

## 2013-10-12 MED ORDER — ALUM & MAG HYDROXIDE-SIMETH 200-200-20 MG/5ML PO SUSP: 15.0000 mL | ORAL | Status: DC | PRN

## 2013-10-12 MED ORDER — SODIUM CHLORIDE 0.9 % IV SOLN: 500.0000 mL | Freq: Once | INTRAVENOUS | Status: AC | PRN

## 2013-10-12 MED ORDER — CLOPIDOGREL BISULFATE 75 MG PO TABS
75.0000 mg | ORAL_TABLET | Freq: Every day | ORAL | Status: DC
  Administered 2013-10-14 – 2013-10-16 (×3): 75 mg via ORAL
  Filled 2013-10-12 (×5): qty 1

## 2013-10-12 MED ORDER — OXYCODONE HCL 5 MG PO TABS
5.0000 mg | ORAL_TABLET | Freq: Once | ORAL | Status: AC | PRN
  Administered 2013-10-12: 5 mg via ORAL

## 2013-10-12 MED ORDER — HYDROMORPHONE HCL PF 1 MG/ML IJ SOLN
INTRAMUSCULAR | Status: AC
  Filled 2013-10-12: qty 1

## 2013-10-12 MED ORDER — LACTATED RINGERS IV SOLN
INTRAVENOUS | Status: DC | PRN
  Administered 2013-10-12 (×3): via INTRAVENOUS

## 2013-10-12 MED ORDER — HYDROMORPHONE HCL PF 1 MG/ML IJ SOLN
0.2500 mg | INTRAMUSCULAR | Status: DC | PRN
  Administered 2013-10-12 (×4): 0.25 mg via INTRAVENOUS

## 2013-10-12 MED ORDER — SODIUM CHLORIDE 0.9 % IJ SOLN
INTRAMUSCULAR | Status: AC
  Filled 2013-10-12: qty 10

## 2013-10-12 MED ORDER — PHENOL 1.4 % MT LIQD: 1.0000 | OROMUCOSAL | Status: DC | PRN

## 2013-10-12 MED ORDER — MIDAZOLAM HCL 2 MG/2ML IJ SOLN
INTRAMUSCULAR | Status: AC
  Filled 2013-10-12: qty 2

## 2013-10-12 MED ORDER — DOCUSATE SODIUM 100 MG PO CAPS
100.0000 mg | ORAL_CAPSULE | Freq: Every day | ORAL | Status: DC
  Administered 2013-10-13 – 2013-10-16 (×3): 100 mg via ORAL
  Filled 2013-10-12 (×4): qty 1

## 2013-10-12 MED ORDER — FENTANYL CITRATE 0.05 MG/ML IJ SOLN
INTRAMUSCULAR | Status: AC
  Filled 2013-10-12: qty 5

## 2013-10-12 MED ORDER — NEOSTIGMINE METHYLSULFATE 1 MG/ML IJ SOLN
INTRAMUSCULAR | Status: AC
  Filled 2013-10-12: qty 10

## 2013-10-12 MED ORDER — LIDOCAINE HCL (CARDIAC) 20 MG/ML IV SOLN
INTRAVENOUS | Status: DC | PRN
  Administered 2013-10-12: 70 mg via INTRAVENOUS

## 2013-10-12 MED ORDER — IOHEXOL 300 MG/ML  SOLN
INTRAMUSCULAR | Status: DC | PRN
  Administered 2013-10-12: 29 mL via INTRAVENOUS

## 2013-10-12 MED ORDER — ALPRAZOLAM 0.5 MG PO TABS
1.0000 mg | ORAL_TABLET | Freq: Four times a day (QID) | ORAL | Status: DC
  Administered 2013-10-12 – 2013-10-15 (×8): 1 mg via ORAL
  Filled 2013-10-12 (×8): qty 2

## 2013-10-12 MED ORDER — POTASSIUM CHLORIDE CRYS ER 20 MEQ PO TBCR
20.0000 meq | EXTENDED_RELEASE_TABLET | Freq: Two times a day (BID) | ORAL | Status: DC
  Administered 2013-10-13 – 2013-10-16 (×6): 20 meq via ORAL
  Filled 2013-10-12 (×9): qty 1

## 2013-10-12 MED ORDER — PROTAMINE SULFATE 10 MG/ML IV SOLN
INTRAVENOUS | Status: DC | PRN
  Administered 2013-10-12: 90 mg via INTRAVENOUS

## 2013-10-12 MED ORDER — GUAIFENESIN-DM 100-10 MG/5ML PO SYRP: 15.0000 mL | ORAL_SOLUTION | ORAL | Status: DC | PRN

## 2013-10-12 MED ORDER — OXYCODONE HCL 5 MG PO TABS
ORAL_TABLET | ORAL | Status: AC
  Administered 2013-10-12: 5 mg via ORAL
  Filled 2013-10-12: qty 1

## 2013-10-12 MED ORDER — LISINOPRIL 20 MG PO TABS
20.0000 mg | ORAL_TABLET | Freq: Every day | ORAL | Status: DC
  Administered 2013-10-13 – 2013-10-16 (×3): 20 mg via ORAL
  Filled 2013-10-12 (×5): qty 1

## 2013-10-12 MED ORDER — ENOXAPARIN SODIUM 30 MG/0.3ML ~~LOC~~ SOLN
30.0000 mg | SUBCUTANEOUS | Status: DC
  Filled 2013-10-12: qty 0.3

## 2013-10-12 MED ORDER — HYDRALAZINE HCL 20 MG/ML IJ SOLN: 10.0000 mg | INTRAMUSCULAR | Status: DC | PRN

## 2013-10-12 MED ORDER — ONDANSETRON HCL 4 MG/2ML IJ SOLN: 4.0000 mg | Freq: Four times a day (QID) | INTRAMUSCULAR | Status: DC | PRN

## 2013-10-12 MED ORDER — SODIUM CHLORIDE 0.9 % IV SOLN: INTRAVENOUS | Status: DC

## 2013-10-12 MED ORDER — EPHEDRINE SULFATE 50 MG/ML IJ SOLN
INTRAMUSCULAR | Status: AC
  Filled 2013-10-12: qty 1

## 2013-10-12 MED ORDER — ACETAMINOPHEN 650 MG RE SUPP: 325.0000 mg | RECTAL | Status: DC | PRN

## 2013-10-12 MED ORDER — PANTOPRAZOLE SODIUM 40 MG PO TBEC
40.0000 mg | DELAYED_RELEASE_TABLET | Freq: Every day | ORAL | Status: DC
  Administered 2013-10-13 – 2013-10-16 (×4): 40 mg via ORAL
  Filled 2013-10-12 (×3): qty 1

## 2013-10-12 MED ORDER — METOPROLOL TARTRATE 25 MG PO TABS
25.0000 mg | ORAL_TABLET | Freq: Every day | ORAL | Status: DC
  Administered 2013-10-13 – 2013-10-16 (×3): 25 mg via ORAL
  Filled 2013-10-12 (×4): qty 1

## 2013-10-12 MED ORDER — PHENYLEPHRINE HCL 10 MG/ML IJ SOLN
10.0000 mg | INTRAVENOUS | Status: DC | PRN
  Administered 2013-10-12: 10 ug/min via INTRAVENOUS

## 2013-10-12 MED ORDER — METOPROLOL TARTRATE 1 MG/ML IV SOLN: 2.0000 mg | INTRAVENOUS | Status: DC | PRN

## 2013-10-12 MED ORDER — LABETALOL HCL 5 MG/ML IV SOLN
10.0000 mg | INTRAVENOUS | Status: DC | PRN
  Filled 2013-10-12: qty 4

## 2013-10-12 MED ORDER — POTASSIUM CHLORIDE CRYS ER 20 MEQ PO TBCR: 20.0000 meq | EXTENDED_RELEASE_TABLET | Freq: Every day | ORAL | Status: DC | PRN

## 2013-10-12 MED ORDER — THROMBIN 20000 UNITS EX SOLR
CUTANEOUS | Status: DC | PRN
  Administered 2013-10-12: 13:00:00 via TOPICAL

## 2013-10-12 MED ORDER — DULOXETINE HCL 60 MG PO CPEP
60.0000 mg | ORAL_CAPSULE | Freq: Two times a day (BID) | ORAL | Status: DC
  Administered 2013-10-12 – 2013-10-16 (×7): 60 mg via ORAL
  Filled 2013-10-12 (×9): qty 1

## 2013-10-12 MED ORDER — SODIUM CHLORIDE 0.9 % IV SOLN
INTRAVENOUS | Status: DC
  Administered 2013-10-12 – 2013-10-13 (×2): via INTRAVENOUS

## 2013-10-12 MED ORDER — PROPOFOL 10 MG/ML IV BOLUS
INTRAVENOUS | Status: DC | PRN
  Administered 2013-10-12: 150 mg via INTRAVENOUS

## 2013-10-12 MED ORDER — MORPHINE SULFATE 2 MG/ML IJ SOLN
2.0000 mg | INTRAMUSCULAR | Status: DC | PRN
  Administered 2013-10-12 – 2013-10-14 (×8): 2 mg via INTRAVENOUS
  Filled 2013-10-12 (×8): qty 1

## 2013-10-12 MED ORDER — ROCURONIUM BROMIDE 50 MG/5ML IV SOLN
INTRAVENOUS | Status: AC
  Filled 2013-10-12: qty 1

## 2013-10-12 SURGICAL SUPPLY — 63 items
ADH SKN CLS APL DERMABOND .7 (GAUZE/BANDAGES/DRESSINGS)
BANDAGE ESMARK 6X9 LF (GAUZE/BANDAGES/DRESSINGS) IMPLANT
BLADE 10 SAFETY STRL DISP (BLADE) ×3 IMPLANT
BNDG CMPR 9X6 STRL LF SNTH (GAUZE/BANDAGES/DRESSINGS)
BNDG ESMARK 6X9 LF (GAUZE/BANDAGES/DRESSINGS)
CANISTER SUCTION 2500CC (MISCELLANEOUS) ×3 IMPLANT
CANNULA VESSEL W/WING WO/VALVE (CANNULA) IMPLANT
CLIP TI MEDIUM 24 (CLIP) ×3 IMPLANT
CLIP TI WIDE RED SMALL 24 (CLIP) ×4 IMPLANT
COVER SURGICAL LIGHT HANDLE (MISCELLANEOUS) ×3 IMPLANT
CUFF TOURNIQUET SINGLE 24IN (TOURNIQUET CUFF) IMPLANT
CUFF TOURNIQUET SINGLE 34IN LL (TOURNIQUET CUFF) IMPLANT
CUFF TOURNIQUET SINGLE 44IN (TOURNIQUET CUFF) IMPLANT
DERMABOND ADVANCED (GAUZE/BANDAGES/DRESSINGS)
DERMABOND ADVANCED .7 DNX12 (GAUZE/BANDAGES/DRESSINGS) IMPLANT
DRAIN SNY WOU (WOUND CARE) IMPLANT
DRAPE WARM FLUID 44X44 (DRAPE) ×3 IMPLANT
DRAPE X-RAY CASS 24X20 (DRAPES) ×1 IMPLANT
DRSG COVADERM 4X14 (GAUZE/BANDAGES/DRESSINGS) ×1 IMPLANT
DRSG COVADERM 4X6 (GAUZE/BANDAGES/DRESSINGS) ×1 IMPLANT
DRSG COVADERM 4X8 (GAUZE/BANDAGES/DRESSINGS) ×1 IMPLANT
ELECT REM PT RETURN 9FT ADLT (ELECTROSURGICAL) ×3
ELECTRODE REM PT RTRN 9FT ADLT (ELECTROSURGICAL) ×2 IMPLANT
EVACUATOR SILICONE 100CC (DRAIN) IMPLANT
GLOVE BIO SURGEON STRL SZ 6.5 (GLOVE) ×8 IMPLANT
GLOVE BIO SURGEON STRL SZ7.5 (GLOVE) ×5 IMPLANT
GLOVE BIOGEL PI IND STRL 6.5 (GLOVE) IMPLANT
GLOVE BIOGEL PI IND STRL 7.0 (GLOVE) IMPLANT
GLOVE BIOGEL PI IND STRL 7.5 (GLOVE) IMPLANT
GLOVE BIOGEL PI INDICATOR 6.5 (GLOVE) ×1
GLOVE BIOGEL PI INDICATOR 7.0 (GLOVE) ×2
GLOVE BIOGEL PI INDICATOR 7.5 (GLOVE) ×1
GLOVE SURG SS PI 6.5 STRL IVOR (GLOVE) ×3 IMPLANT
GOWN STRL REUS W/ TWL LRG LVL3 (GOWN DISPOSABLE) ×6 IMPLANT
GOWN STRL REUS W/TWL LRG LVL3 (GOWN DISPOSABLE) ×12
KIT BASIN OR (CUSTOM PROCEDURE TRAY) ×3 IMPLANT
KIT ROOM TURNOVER OR (KITS) ×3 IMPLANT
NS IRRIG 1000ML POUR BTL (IV SOLUTION) ×6 IMPLANT
PACK PERIPHERAL VASCULAR (CUSTOM PROCEDURE TRAY) ×3 IMPLANT
PAD ARMBOARD 7.5X6 YLW CONV (MISCELLANEOUS) ×6 IMPLANT
PADDING CAST COTTON 6X4 STRL (CAST SUPPLIES) IMPLANT
SET COLLECT BLD 21X3/4 12 (NEEDLE) ×1 IMPLANT
SPONGE SURGIFOAM ABS GEL 100 (HEMOSTASIS) ×1 IMPLANT
STAPLER VISISTAT 35W (STAPLE) ×2 IMPLANT
STOPCOCK 4 WAY LG BORE MALE ST (IV SETS) ×1 IMPLANT
SUT PROLENE 5 0 C 1 24 (SUTURE) ×4 IMPLANT
SUT PROLENE 6 0 CC (SUTURE) ×4 IMPLANT
SUT PROLENE 7 0 BV 1 (SUTURE) IMPLANT
SUT PROLENE 7 0 BV1 MDA (SUTURE) ×1 IMPLANT
SUT SILK 2 0 SH (SUTURE) ×3 IMPLANT
SUT SILK 3 0 (SUTURE) ×6
SUT SILK 3-0 18XBRD TIE 12 (SUTURE) IMPLANT
SUT VIC AB 2-0 CTX 36 (SUTURE) ×6 IMPLANT
SUT VIC AB 3-0 SH 27 (SUTURE) ×27
SUT VIC AB 3-0 SH 27X BRD (SUTURE) ×4 IMPLANT
SUT VIC AB 4-0 PS2 27 (SUTURE) IMPLANT
TAPE UMBILICAL COTTON 1/8X30 (MISCELLANEOUS) IMPLANT
TOWEL OR 17X24 6PK STRL BLUE (TOWEL DISPOSABLE) ×6 IMPLANT
TOWEL OR 17X26 10 PK STRL BLUE (TOWEL DISPOSABLE) ×6 IMPLANT
TRAY FOLEY CATH 16FRSI W/METER (SET/KITS/TRAYS/PACK) ×3 IMPLANT
TUBING EXTENTION W/L.L. (IV SETS) ×1 IMPLANT
UNDERPAD 30X30 INCONTINENT (UNDERPADS AND DIAPERS) ×3 IMPLANT
WATER STERILE IRR 1000ML POUR (IV SOLUTION) ×3 IMPLANT

## 2013-10-12 NOTE — Transfer of Care (Signed)
Immediate Anesthesia Transfer of Care Note  Patient: Sydney Phillips  Procedure(s) Performed: Procedure(s):   Femoral-Peroneal trunk  bypass with nonreversed greater saphenous vein graft (Right) INTRA OPERATIVE ARTERIOGRAM (Right)  Patient Location: PACU  Anesthesia Type:General  Level of Consciousness: awake  Airway & Oxygen Therapy: Patient Spontanous Breathing and Patient connected to face mask oxygen  Post-op Assessment: Report given to PACU RN and Post -op Vital signs reviewed and stable  Post vital signs: Reviewed and stable  Complications: No apparent anesthesia complications

## 2013-10-12 NOTE — Interval H&P Note (Signed)
History and Physical Interval Note:  10/12/2013 8:52 AM  Sydney Phillips  has presented today for surgery, with the diagnosis of PVD with claudication  The various methods of treatment have been discussed with the patient and family. After consideration of risks, benefits and other options for treatment, the patient has consented to  Procedure(s): BYPASS GRAFT FEMORAL-POPLITEAL ARTERY (Right) as a surgical intervention .  The patient's history has been reviewed, patient examined, no change in status, stable for surgery.  I have reviewed the patient's chart and labs.  Questions were answered to the patient's satisfaction.     Elam Dutch

## 2013-10-12 NOTE — Anesthesia Postprocedure Evaluation (Signed)
Anesthesia Post Note  Patient: Sydney Phillips  Procedure(s) Performed: Procedure(s) (LRB):   Femoral-Peroneal trunk  bypass with nonreversed greater saphenous vein graft (Right) INTRA OPERATIVE ARTERIOGRAM (Right)  Anesthesia type: General  Patient location: PACU  Post pain: Pain level controlled  Post assessment: Patient's Cardiovascular Status Stable  Last Vitals:  Filed Vitals:   10/12/13 1412  BP:   Pulse:   Temp: 36.7 C  Resp:     Post vital signs: Reviewed and stable  Level of consciousness: alert  Complications: No apparent anesthesia complications

## 2013-10-12 NOTE — Progress Notes (Signed)
Physician notified: Fields At: 1917  Regarding: Hematoma increased in size in inner thigh. Oozing increased on island dressing in upper thigh.  Awaiting return response.   Returned Response at: 1920  Order(s): Will see patient.

## 2013-10-12 NOTE — Anesthesia Preprocedure Evaluation (Addendum)
Anesthesia Evaluation  Patient identified by MRN, date of birth, ID band Patient awake    Reviewed: Allergy & Precautions, H&P , NPO status , Patient's Chart, lab work & pertinent test results, reviewed documented beta blocker date and time   Airway Mallampati: II TM Distance: >3 FB Neck ROM: full    Dental  (+) Edentulous Upper, Poor Dentition, Dental Advisory Given   Pulmonary shortness of breath, COPD COPD inhaler, Current Smoker,  breath sounds clear to auscultation        Cardiovascular hypertension, On Medications and On Home Beta Blockers + CAD, + Peripheral Vascular Disease and +CHF negative cardio ROS  + dysrhythmias + pacemaker + Cardiac Defibrillator Rhythm:regular     Neuro/Psych PSYCHIATRIC DISORDERS  Neuromuscular disease    GI/Hepatic Neg liver ROS, GERD-  Medicated and Controlled,  Endo/Other  negative endocrine ROS  Renal/GU Renal disease  negative genitourinary   Musculoskeletal   Abdominal   Peds  Hematology negative hematology ROS (+)   Anesthesia Other Findings See surgeon's H&P   Reproductive/Obstetrics negative OB ROS                          Anesthesia Physical Anesthesia Plan  ASA: III  Anesthesia Plan: General   Post-op Pain Management:    Induction: Intravenous  Airway Management Planned: Oral ETT  Additional Equipment:   Intra-op Plan:   Post-operative Plan: Extubation in OR  Informed Consent: I have reviewed the patients History and Physical, chart, labs and discussed the procedure including the risks, benefits and alternatives for the proposed anesthesia with the patient or authorized representative who has indicated his/her understanding and acceptance.   Dental Advisory Given and Dental advisory given  Plan Discussed with: CRNA, Surgeon and Anesthesiologist  Anesthesia Plan Comments:        Anesthesia Quick Evaluation

## 2013-10-12 NOTE — Progress Notes (Signed)
Pt admitted to unit. R medial thigh to groin hematoma noted, outlined, purple, firm. Doren Custard, PACU RN stated Dr. Oneida Alar aware and saw pt in PACU. Will call if any changes in size or pulse. Oozing noted on island dressing down medial side of leg, outlined. RN educated patient on smoking cessation, pt does not desire to quit; RN explained importance of stopping for vascular graft purposing and overall health. VSS. Will continue to monitor.

## 2013-10-12 NOTE — Progress Notes (Signed)
MEDICATION RELATED CONSULT NOTE - INITIAL   Pharmacy consulted for renal antibiotic adjustment. 77yof s/p vascular procedure to receive Vancomycin for post surgical prophylaxis due to PCN allergy. Wt 83kg, CrCl 50ml/min.   Patient received Vancomycin 1g pre-op ~0900. Will renally adjust Vancomycin to 750mg  IV q12h x 2 doses. Pharmacy will sign off. Please reconsult if additional assistance is needed. Thanks!!  Earleen Newport, PharmD [phone] 10/12/2013

## 2013-10-12 NOTE — Progress Notes (Signed)
Called to see pt for hematoma right upper thigh, pt only complaint is right shoulder pain that she gets with her fibromyalgia.  No chest pain.  Filed Vitals:   10/12/13 1615 10/12/13 1617 10/12/13 1651 10/12/13 1918  BP:  115/55 114/85 127/49  Pulse: 84 84 92 105  Temp:   97.9 F (36.6 C) 98.3 F (36.8 C)  TempSrc:   Oral Oral  Resp: 19 18 19 20   Height:   5\' 5"  (1.651 m)   Weight:   187 lb 13.3 oz (85.2 kg)   SpO2: 92% 93% 98% 93%    Right lower extremity foot pink and warm, left upper thigh incision small hematoma, not tense no skin compromise, some ecchymosis posteriorly  A: small hematoma right upper thigh saphenectomy site, currently overall soft no skin compromise no evidence of arterial bleeding  P: Continue to observe most likely vein branch from saphenectomy and should resolve  Ruta Hinds, MD [facility] Office: [phone] Pager: [phone]

## 2013-10-12 NOTE — Telephone Encounter (Addendum)
Message copied by Doristine Section on Wed Oct 12, 2013  3:58 PM ------      Message from: Peter Minium K      Created: Wed Oct 12, 2013  1:54 PM      Regarding: Schedule                   ----- Message -----         From: Gabriel Earing, PA-C         Sent: 10/12/2013   1:53 PM           To: [facility] Charge Pool            S/p right fem pop with vein 10/12/13.  F/u with Dr. Oneida Alar in 2 weeks.            Thanks,      Aldona Bar ------  notified patient of fu appt. on 10-27-13 at 9am with dr. Oneida Alar

## 2013-10-12 NOTE — H&P (View-Only) (Signed)
[facility] [place]  HISTORY AND PHYSICAL  History of Present Illness: Patient is a 78 y.o. year old female who presents for evaluation of right leg numbness and weakness and claudication symptoms. This is been present for approximately 6 months. She thinks she also has some early symptoms similar to this in the left leg. She denies rest pain. She denies nonhealing wounds on the foot. She states that in the past she thinks she may have had some sort of stent put in her leg by Dr. Gwenlyn Found. I was unable to find details regarding this in her EPIC chart today. She did have a left subclavian stent placed by Dr. Alvester Chou several years ago this was done for dizziness. Other medical problems include COPD, hyperlipidemia, coronary disease, hypertension, depression, CHF and has an AICD pacer. She currently smokes half a pack of cigarettes per day. Greater than 3 minutes they spent regarding smoking cessation counseling. She is on Plavix and aspirin. However she stopped her Plavix 10 days ago and has yet to refill her prescription. I recently performed an arteriogram on her which showed significant distal right superficial and popliteal artery occlusive disease. She primarily has peroneal runoff. She has tried conservative measures for the last 6 weeks a walking program and still states that she has difficulty walking and wishes to undergo revascularization at this point. He was explained to her today that she is not at risk of limb loss. Risks benefits and possible complications of the vas relation were explained the patient today including the possibility of increasing her chances of limb loss. Also it was explained to her that durability of any bypass procedure would be limited if she continues to smoke.   Past Medical History   Diagnosis  Date   .  COPD (chronic obstructive pulmonary disease)    .  Hx of colonic polyps    .  Insomnia    .  Fibromyalgia    .  Hyperlipemia      takes Crestor  daily   .  CAD (coronary artery disease)      Currently angina free, no evidence of reversible ischemia   .  Vertigo      takes Meclizine prn   .  Subclavian arterial stenosis, lt, with PTA/STENT 07/31/11  08/01/2011   .  S/P angioplasty with stent, lt. subclavian 07/31/11  08/01/2011   .  Hypertension      takes Amlodipine and Metoprolol daily   .  Bronchitis    .  Arthritis    .  Chronic back pain    .  GERD (gastroesophageal reflux disease)      was on meds but was taken off;now watches what she eats   .  Chronic constipation    .  Hemorrhoids    .  Urinary incontinence    .  Early cataracts, bilateral    .  Anxiety    .  Depression      takes Cymbalta daily   .  Congestive heart failure (CHF)      New York Heart Association functional class 2, diastolic dysfunction   .  PAD (peripheral artery disease)      Carotid, subclavian, and lower extremity beds, currently not symptomatic   .  Syncope  07/28/2011     EF - 50-55, moderate concentric hypertrophy in left ventricle   .  LBBB (left bundle branch block)      Stress test 09/03/2010, EF 55   .  Nephrolithiasis     Past Surgical History   Procedure  Laterality  Date   .  Appendectomy     .  Tonsillectomy     .  Tubal ligation     .  Cardiac defibrillator placement   05/2008     By Dr Blanch Media, [employer] CANNOT HAVE MRI's   .  Abdominal hysterectomy     .  Back surgery   2012   .  Orif elbow fracture   08/16/2011     Procedure: OPEN REDUCTION INTERNAL FIXATION (ORIF) ELBOW/OLECRANON FRACTURE; Surgeon: Schuyler Amor, MD; Location: [place]; Service: Orthopedics; Laterality: Left;   .  Cardiac catheterization   12/01/2007     By Dr. Melvern Banker, left heart cath,   .  Coronary angioplasty     .  Subclavian stent placement  Left  07/31/2011     7x18 [employer], balloon, with reduction of 90% ostial left subclavian artery stenosis to 0% with residual excellent flow   Social History  History   Substance Use Topics   .  Smoking status:   Current Every Day Smoker -- 0.50 packs/day for 60 years     Types:  Cigarettes   .  Smokeless tobacco:  Never Used      Comment: less than 1/2 pack   .  Alcohol Use:  No   Family History  Family History   Problem  Relation  Age of Onset   .  Hyperlipidemia     .  Anesthesia problems  Neg Hx    .  Hypotension  Neg Hx    .  Malignant hyperthermia  Neg Hx    .  Pseudochol deficiency  Neg Hx    .  Colon cancer  Maternal Grandmother    .  Cancer  Sister      ovarian   .  Diabetes  Sister    .  Heart disease  Sister    .  CAD  Father    .  Heart disease  Father    .  Hyperlipidemia  Father    .  Heart disease  Mother    .  Deep vein thrombosis  Son    Allergies  Allergies   Allergen  Reactions   .  Codeine  Itching   .  Darvon  Itching   .  Meloxicam  Other (See Comments)     Unknown   .  Propoxyphene N-Acetaminophen  Itching   .  Rofecoxib  Other (See Comments)     Unknown   .  Azithromycin  Rash   .  Erythromycin  Rash   .  Penicillins  Rash   .  Sulfa Antibiotics  Rash    Current Outpatient Prescriptions   Medication  Sig  Dispense  Refill   .  ALPRAZolam (XANAX) 1 MG tablet  TAKE ONE TABLET BY MOUTH 4 TIMES DAILY  120 tablet  5   .  aspirin 325 MG tablet  Take 325 mg by mouth daily.     .  CYMBALTA 60 MG capsule  TAKE ONE CAPSULE BY MOUTH TWICE DAILY  60 capsule  11   .  furosemide (LASIX) 80 MG tablet  TAKE ONE TABLET BY MOUTH TWICE DAILY  180 tablet  1   .  metoprolol tartrate (LOPRESSOR) 25 MG tablet  12.5 mg 2 (two) times daily.     .  clopidogrel (PLAVIX) 75 MG tablet  Take 1 tablet (75 mg total)  by mouth daily.  30 tablet  11   .  furosemide (LASIX) 80 MG tablet  Take 1 tablet (80 mg total) by mouth daily.  180 tablet  1   .  HYDROcodone-acetaminophen (NORCO) 10-325 MG per tablet  Take 1 tablet by mouth every 6 (six) hours as needed for pain.  60 tablet  0   .  potassium chloride (KLOR-CON 10) 10 MEQ tablet  Take 1 tablet (10 mEq total) by mouth 2 (two) times  daily.  60 tablet  11    No current facility-administered medications for this visit.   ROS:  General: No weight loss, Fever, chills  HEENT: No recent headaches, no nasal bleeding, no visual changes, no sore throat  Neurologic: + dizziness, blackouts, seizures. No recent symptoms of stroke or mini- stroke. No recent episodes of slurred speech, or temporary blindness.  Cardiac: No recent episodes of chest pain/pressure, no shortness of breath at rest. + shortness of breath with exertion. Denies history of atrial fibrillation or irregular heartbeat  Vascular: No history of rest pain in feet. +history of claudication. No history of non-healing ulcer, No history of DVT  Pulmonary: No home oxygen, no productive cough, no hemoptysis, No asthma or wheezing  Musculoskeletal: [ ]  Arthritis, [x ] Low back pain, [ ]  Joint pain  Hematologic:No history of hypercoagulable state. No history of easy bleeding. No history of anemia  Gastrointestinal: No hematochezia or melena, + gastroesophageal reflux, no trouble swallowing  Urinary: [ ]  chronic Kidney disease, [ ]  on HD - [ ]  MWF or [ ]  TTHS, [ ]  Burning with urination, [ ]  Frequent urination, [ ]  Difficulty urinating;  Skin: No rashes  Psychological: No history of anxiety, + history of depression    Physical Examination  Filed Vitals:   09/29/13 1200  BP: 109/69  Pulse: 74  Resp: 16  Height: 5' 6.5" (1.689 m)  Weight: 182 lb 1.6 oz (82.6 kg)    Body mass index is 29.36 kg/(m^2).  General: Alert and oriented, no acute distress  HEENT: Normal  Neck: No bruit or JVD  Pulmonary: Clear to auscultation bilaterally  Cardiac: Regular Rate and Rhythm without murmur  Abdomen: Soft, non-tender, non-distended, no mass  Skin: No rash  Extremity Pulses: 2+ radial, brachial right side absent left radial pulse 2+ left brachial pulse, 2+ femoral, absent dorsalis pedis, posterior tibial pulses bilaterally  Musculoskeletal: No deformity or edema  Neurologic:  Upper and lower extremity motor 5/5 and symmetric   DATA: I reviewed the patient's recent arteriogram today. She has high take off of the tibial vessels. Primary runoff vessel with posterior tibial artery. She has superficial femoral and popliteal artery occlusive disease. The patient had a noninvasive arterial exam dated December 22 which I reviewed today. She an ABI on the right 0.47 left 0.93 this suggested a right popliteal artery stenosis   ASSESSMENT: Claudication right lower extremity lifestyle limiting   PLAN: Smoking cessation was emphasized. The patient wishes to have a bypass procedure at this point. We will stop her Plavix for 10 days prior to her bypass. She will continue her aspirin. Her procedure is scheduled for 10/12/2013.  Ruta Hinds, MD  [facility] [place]  Office: [phone]  Pager: [phone]

## 2013-10-12 NOTE — Op Note (Signed)
Procedure: Right femoral to tibioperoneal trunk bypass with non-reversed ipsilateral great saphenous vein, intraop arteriogram  Preoperative diagnosis: Claudication  Postoperative diagnosis: Same  Anesthesia: General  Asst.: Adele Barthel, MD, Evorn Gong, PA-C  Operative findings:     1 vessel peroneal runoff, good quality saphenous vein 3-4 mm  Operative details: After obtaining informed consent, the patient was taken to the operating room. The patient was placed in supine position on the operating room table. After induction of general anesthesia and endotracheal intubation, a Foley catheter was placed. Next, the patient's entire right lower extremity was prepped and draped in the usual sterile fashion. A longitudinal incision was then made in the right groin and carried down through the subcutaneous tissues to expose the right common femoral artery.   The common femoral artery was dissected free circumferentially. There was a pulse within the common femoral artery. There was some posterior plaque.  The distal external iliac artery was dissected free circumferentially underneath the inguinal ligament.  A vessel loop was also placed around the distal external iliac artery. The superficial femoral and profunda femoris arteries were dissected free circumferentially and vessel loops placed around them.  Next the saphenofemoral junction was identified in the medial portion of the groin incision and this was harvested through several skip incisions on the medial aspect of the leg.  Side branches were ligated and divided between silk ties or clips.  The vein was of good quality 3-4 mm diameter  The vein harvest incision was deepened into the fascia at the below knee segment and the below knee popliteal space was entered.  The patient had and aberrant takeoff of the anterior tibial artery so the tibioperoneal trunk was dissected free circumferentially.  It was soft. A portion of the soleus muscle was  taken down in order aid with exposure.  A tunnel was then created between the heads of the gastrocnemius muscle subsartorial up to the groin.  The vein was ligated distally and at the saphenofemoral junction with a running 5 0 prolene.  The vein was gently distended with heparinized saline and inspected for hemostasis.    The patient was given 9000 units of heparin.  After appropriate circulation time, the distal right external iliac artery was controlled with a small Cooley clamp. The profunda was controlled with a vessel loop and the SFA with a fistula clamp.   A longitudinal opening was made in the common femoral artery on its anterior surface.  The vessel was calcified posteriorly but sewable. The vein was placed in a non reversed configuration.  The arteriotomy was extended with Pott's scissors.  The vein was spatulated and sewn end to side to the artery using a running 5 0 Prolene.  Just prior to completion of the anastomosis everything was forebled backbled and thoroughly flushed. Proximal clamp and distal clamps were removed and there was good pulsatile flow in the profunda femoris artery immediately.    The graft was then brought through the subsartorial tunnel down to the tibioperoneal trunk after marking for orientation. The below-knee popliteal artery was controlled proximally and distally with a Henley clamp. A longitudinal opening was made in the tibioperoneal trunk artery in an area that was fairly free of calcification. The graft was then cut to length and spatulated and sewn end of graft to side of artery using running 6-0 Prolene suture.  At completion of the anastomosis everything was forebled backbled and thoroughly flushed. The remainder of the anastomosis was completed and all clamps were removed  restoring pulsatile flow to the tibioperoneal trunk. An intraoperative arteriogram was by introducing a 21 gauge butterfly into the proximal vein graft.  This was obtained with inflow occlusion.   The distal anastomosis was patent with peroneal runoff.  The patient had monophasic to biphasic Doppler flow in the posterior tibial area of the foot. This augmented essentially 100% with unclamping the graft.  One repair stitch was placed in the lateral wall of the proximal anastomosis.    The heparin was fully reversed with protamine.  After hemostasis was obtained, the deep layers and subcutaneous layers of the below-knee popliteal incision were closed with running 3-0 Vicryl suture. The skin was closed with staples.   The saphenectomy incisions were closed with running 3 0 vicryl follow by staples.  The groin was inspected and found to be hemostatic. This was then closed in multiple layers of running 2 0 and 3-0 Vicryl suture and 4-0 subcuticular stitch. The patient tolerated the procedure well and there were no complications. Instrument sponge and needle counts correct in the case. Patient was taken to the recovery in stable condition.  Ruta Hinds, MD [facility] Office: [phone] Pager: [phone]

## 2013-10-12 NOTE — Plan of Care (Signed)
Problem: Consults Goal: Tobacco Cessation referral if indicated Outcome: Completed/Met Date Met:  10/12/13 Pt educated by [employer]

## 2013-10-13 ENCOUNTER — Encounter (HOSPITAL_COMMUNITY): Payer: Self-pay | Admitting: Vascular Surgery

## 2013-10-13 DIAGNOSIS — I739 Peripheral vascular disease, unspecified: Secondary | ICD-10-CM

## 2013-10-13 LAB — CBC
HCT: 33.9 % — ABNORMAL LOW (ref 36.0–46.0)
Hemoglobin: 11.2 g/dL — ABNORMAL LOW (ref 12.0–15.0)
MCH: 29.9 pg (ref 26.0–34.0)
MCHC: 33 g/dL (ref 30.0–36.0)
MCV: 90.4 fL (ref 78.0–100.0)
PLATELETS: 246 10*3/uL (ref 150–400)
RBC: 3.75 MIL/uL — AB (ref 3.87–5.11)
RDW: 13.9 % (ref 11.5–15.5)
WBC: 12.1 10*3/uL — AB (ref 4.0–10.5)

## 2013-10-13 LAB — BASIC METABOLIC PANEL
BUN: 20 mg/dL (ref 6–23)
CHLORIDE: 98 meq/L (ref 96–112)
CO2: 22 meq/L (ref 19–32)
CREATININE: 1.01 mg/dL (ref 0.50–1.10)
Calcium: 8.3 mg/dL — ABNORMAL LOW (ref 8.4–10.5)
GFR calc Af Amer: 62 mL/min — ABNORMAL LOW (ref >90)
GFR calc non Af Amer: 53 mL/min — ABNORMAL LOW (ref >90)
Glucose, Bld: 139 mg/dL — ABNORMAL HIGH (ref 70–99)
POTASSIUM: 3.7 meq/L (ref 3.7–5.3)
Sodium: 136 mEq/L — ABNORMAL LOW (ref 137–147)

## 2013-10-13 MED ORDER — ENOXAPARIN SODIUM 30 MG/0.3ML ~~LOC~~ SOLN
30.0000 mg | SUBCUTANEOUS | Status: DC
  Filled 2013-10-13: qty 0.3

## 2013-10-13 NOTE — Evaluation (Signed)
Physical Therapy Evaluation Patient Details Name: Sydney Phillips MRN: [id number] DOB: 1935-12-16 Today's Date: 10/13/2013   History of Present Illness  Right femoral to tibioperoneal trunk bypass with non-reversed ipsilateral great saphenous vein, intraop arteriogram  Clinical Impression  Patient demonstrates deficits in mobility as indicated below. Will need skilled PT to address deficits and maximize function. Feel patient will progress well with mobility over the next few days. Recommend HHPT and supervision upon discharge.    Follow Up Recommendations Home health PT;Supervision/Assistance - 24 hour    Equipment Recommendations       Recommendations for Other Services       Precautions / Restrictions Precautions Precautions: Fall Precaution Comments: painful RLE/multiple staples Restrictions Weight Bearing Restrictions: No      Mobility  Bed Mobility Overal bed mobility: Needs Assistance Bed Mobility: Supine to Sit     Supine to sit: Mod assist     General bed mobility comments: received in chair  Transfers Overall transfer level: Needs assistance Equipment used: Rolling walker (2 wheeled) Transfers: Sit to/from Stand Sit to Stand: Min assist Stand pivot transfers: Mod assist       General transfer comment: VCs for hand placement, assist to scoot to front of chair, min assist to come to standing and manual placement of hand on RW  Ambulation/Gait Ambulation/Gait assistance: Min assist Ambulation Distance (Feet): 90 Feet Assistive device: Rolling walker (2 wheeled) Gait Pattern/deviations: Step-through pattern;Decreased stride length;Trunk flexed;Drifts right/left Gait velocity: decreased Gait velocity interpretation: Below normal speed for age/gender General Gait Details: VCs for position within RW, increased cues for upright posture, patient very slow to get moving but once she is up, she ambulated with min guard  Stairs            Wheelchair  Mobility    Modified Rankin (Stroke Patients Only)       Balance Overall balance assessment: Needs assistance   Sitting balance-Leahy Scale: Fair     Standing balance support: During functional activity Standing balance-Leahy Scale: Poor                               Pertinent Vitals/Pain 10/10 (NAD)    Home Living Family/patient expects to be discharged to:: Private residence Living Arrangements: Other relatives;Children Available Help at Discharge: Available 24 hours/day Type of Home: House Home Access: Stairs to enter Entrance Stairs-Rails: Can reach both Entrance Stairs-Number of Steps: 2 Home Layout: One level Home Equipment: Walker - 2 wheels;Bedside commode;Tub bench      Prior Function Level of Independence: Independent;Independent with assistive device(s) (used RW when needed)               Hand Dominance   Dominant Hand: Right    Extremity/Trunk Assessment   Upper Extremity Assessment: Generalized weakness (c/o R shoulder pain from fibromyalgia)           Lower Extremity Assessment: RLE deficits/detail RLE Deficits / Details: RLE painful and edematous    Cervical / Trunk Assessment: Other exceptions  Communication   Communication: No difficulties  Cognition Arousal/Alertness: Awake/alert Behavior During Therapy: Flat affect Overall Cognitive Status: No family/caregiver present to determine baseline cognitive functioning                      General Comments General comments (skin integrity, edema, etc.): spoke with patient regardinging positioning for edema control and positioning as well as importance of mobility.    Exercises  Assessment/Plan    PT Assessment Patient needs continued PT services  PT Diagnosis Difficulty walking;Abnormality of gait;Generalized weakness;Acute pain   PT Problem List Decreased strength;Decreased range of motion;Decreased activity tolerance;Decreased balance;Decreased  mobility;Decreased knowledge of use of DME;Obesity;Pain  PT Treatment Interventions DME instruction;Gait training;Stair training;Functional mobility training;Therapeutic activities;Therapeutic exercise;Balance training;Patient/family education   PT Goals (Current goals can be found in the Care Plan section) Acute Rehab PT Goals Patient Stated Goal: to not hurt PT Goal Formulation: With patient Time For Goal Achievement: 10/27/13 Potential to Achieve Goals: Good    Frequency Min 4X/week   Barriers to discharge        Co-evaluation               End of Session Equipment Utilized During Treatment: Gait belt Activity Tolerance: Patient tolerated treatment well;Patient limited by fatigue;Patient limited by pain Patient left: in chair;with call bell/phone within reach Nurse Communication: Mobility status         Time: 6948-5462 PT Time Calculation (min): 18 min   Charges:   PT Evaluation $Initial PT Evaluation Tier I: 1 Procedure PT Treatments $Gait Training: 8-22 mins   PT G Codes:          Duncan Dull 10/13/2013, 3:53 PM Alben Deeds, [place] DPT  [phone]

## 2013-10-13 NOTE — Progress Notes (Addendum)
[facility] Progress Note  10/13/2013 7:22 AM 1 Day Post-Op  Subjective:  C/o right hip pain, which she says is not new; states she has feeling in her right foot and it has been a long time since she has had that  Afebrile 80's-100's regular 322'G-254'Y systolic 70% RA  Filed Vitals:   10/13/13 0337  BP: 111/47  Pulse: 106  Temp: 98.3 F (36.8 C)  Resp: 26    Physical Exam: Incisions:  Right groin incision is c/d/i; proximal thigh with extensive ecchymosis extending over the labia.  Incisions are intact with staples Extremities:  Right foot is warm; biphasic doppler signals right PT/DP  CBC    Component Value Date/Time   WBC 12.1* 10/13/2013 0406   RBC 3.75* 10/13/2013 0406   HGB 11.2* 10/13/2013 0406   HCT 33.9* 10/13/2013 0406   PLT 246 10/13/2013 0406   MCV 90.4 10/13/2013 0406   MCH 29.9 10/13/2013 0406   MCHC 33.0 10/13/2013 0406   RDW 13.9 10/13/2013 0406   LYMPHSABS 1.9 05/05/2012 1651   MONOABS 0.8 05/05/2012 1651   EOSABS 0.2 05/05/2012 1651   BASOSABS 0.0 05/05/2012 1651    BMET    Component Value Date/Time   NA 136* 10/13/2013 0406   K 3.7 10/13/2013 0406   CL 98 10/13/2013 0406   CO2 22 10/13/2013 0406   GLUCOSE 139* 10/13/2013 0406   BUN 20 10/13/2013 0406   CREATININE 1.01 10/13/2013 0406   CREATININE 1.12* 01/10/2013 1745   CALCIUM 8.3* 10/13/2013 0406   GFRNONAA 53* 10/13/2013 0406   GFRAA 62* 10/13/2013 0406    INR    Component Value Date/Time   INR 0.95 10/07/2013 1029     Intake/Output Summary (Last 24 hours) at 10/13/13 6237 Last data filed at 10/13/13 0600  Gross per 24 hour  Intake   2800 ml  Output   1525 ml  Net   1275 ml     Assessment:  78 y.o. female is s/p:  Right femoral to tibioperoneal trunk bypass with non-reversed ipsilateral great saphenous vein, intraop arteriogram  1 Day Post-Op  Plan: -pt right foot warm with good doppler signals-ABI's today -still with ecchymosis, which has extended over to the labia.   Hematoma right thigh -2 person assist from bed to chair-will definitely need PT -also considering pt is 2 person assist, will keep foley in today, but discontinue tomorrow morning -keep in stepdown today -DVT prophylaxis:  Lovenox to start this afternoon, but considering hematoma right leg, will hold on this one more day.  Hold Plavix today as well.  Use left leg SCD -WBC improved from yesterday and pt is afebrile    Leontine Locket, PA-C [facility] [phone] 10/13/2013 7:22 AM    Pt ambulatory at home and did not require assist to get out of bed.  NO reason she should require this now. Foot warm, patent bypass Small hematoma right medial thigh no pain  Needs to mobilize.  Ambulate.  OOB walking at least 3 times daily Will get PT/OT to see.  Needs rehab consult. Do not let her become dependent encourage independence  Ruta Hinds, MD [facility] [place] Office: [phone] Pager: [phone]

## 2013-10-13 NOTE — Progress Notes (Signed)
Occupational Therapy Evaluation Patient Details Name: Sydney Phillips MRN: [id number] DOB: 1936-03-02 Today's Date: 10/13/2013    History of Present Illness Right femoral to tibioperoneal trunk bypass with non-reversed ipsilateral great saphenous vein, intraop arteriogram   Clinical Impression   PTA, pt lived with son, who works, and was independent with ADL and mod I with mobility @ RW level. Pt very painful today. If pt continues to progress, she will be able to D/C home next week with  HHOT. Otherwise, pt may need short term SNF for rehab. Pt states she will have a friend to help her while her son is at work during the day. Pt will benefit from skilled OT services to facilitate D/C to next venue due to below deficits.    Follow Up Recommendations  Supervision/Assistance - 24 hour;Home health OT    Equipment Recommendations  None recommended by OT    Recommendations for Other Services       Precautions / Restrictions Precautions Precautions: Fall Precaution Comments: painful RLE/multiple staples      Mobility Bed Mobility Overal bed mobility: Needs Assistance Bed Mobility: Supine to Sit     Supine to sit: Mod assist     General bed mobility comments: assist to scoot hips to EOB  Transfers Overall transfer level: Needs assistance   Transfers: Sit to/from Stand;Stand Pivot Transfers Sit to Stand: Min assist Stand pivot transfers: Mod assist       General transfer comment: vc for sequencing and use of RW    Balance Overall balance assessment: Needs assistance   Sitting balance-Leahy Scale: Fair     Standing balance support: During functional activity;Bilateral upper extremity supported Standing balance-Leahy Scale: Poor                              ADL Overall ADL's : Needs assistance/impaired Eating/Feeding: Modified independent   Grooming: Set up   Upper Body Bathing: Set up;Sitting   Lower Body Bathing: Maximal assistance   Upper  Body Dressing : Set up;Supervision/safety   Lower Body Dressing: Maximal assistance   Toilet Transfer: Moderate assistance;Stand-pivot (simulated)   Toileting- Clothing Manipulation and Hygiene:  (foley)       Functional mobility during ADLs: Moderate assistance;Rolling walker (trasnfers only) General ADL Comments: Decline in function     Vision                     Perception     Praxis      Pertinent Vitals/Pain 10/10 - nsg notified. Pt repositioned O2 92 RA. On 2L O2 96     Hand Dominance Right   Extremity/Trunk Assessment Upper Extremity Assessment Upper Extremity Assessment: Generalized weakness (c/o R shoulder pain from fibromyalgia)   Lower Extremity Assessment Lower Extremity Assessment: RLE deficits/detail;Generalized weakness RLE Deficits / Details: RLE painful and edematous RLE: Unable to fully assess due to pain   Cervical / Trunk Assessment Cervical / Trunk Assessment: Other exceptions Cervical / Trunk Exceptions: forward head   Communication Communication Communication: No difficulties   Cognition Arousal/Alertness: Awake/alert Behavior During Therapy: Flat affect Overall Cognitive Status: No family/caregiver present to determine baseline cognitive functioning                     General Comments   Pt prefers to go home.    Exercises       Shoulder Instructions      Home Living Family/patient expects to  be discharged to:: Private residence Living Arrangements: Other relatives;Children Available Help at Discharge: Available 24 hours/day Type of Home: House Home Access: Stairs to enter [employer] of Steps: 2 Entrance Stairs-Rails: Can reach both Home Layout: One level     Bathroom Shower/Tub: Tub/shower unit Shower/tub characteristics: Architectural technologist: Standard Bathroom Accessibility: Yes How Accessible: Accessible via walker (turned sidewasy) Home Equipment: Walker - 2 wheels;Bedside commode;Tub  bench          Prior Functioning/Environment Level of Independence: Independent;Independent with assistive device(s) (used RW when needed)             OT Diagnosis: Generalized weakness;Acute pain   OT Problem List: Decreased strength;Decreased range of motion;Decreased activity tolerance;Decreased knowledge of use of DME or AE;Pain;Increased edema   OT Treatment/Interventions: Self-care/ADL training;Therapeutic exercise;Energy conservation;DME and/or AE instruction;Therapeutic activities;Patient/family education    OT Goals(Current goals can be found in the care plan section) Acute Rehab OT Goals Patient Stated Goal: to not hurt OT Goal Formulation: With patient Time For Goal Achievement: 10/27/13 Potential to Achieve Goals: Good  OT Frequency: Min 2X/week   Barriers to D/C: Other (comment) (unsure how much friend will be able to assist)          Co-evaluation              End of Session Equipment Utilized During Treatment: Gait belt;Rolling walker Nurse Communication: Mobility status;Patient requests pain meds  Activity Tolerance: Patient limited by pain;Patient limited by fatigue Patient left: in chair;with call bell/phone within reach   Time: 1225-1255 OT Time Calculation (min): 30 min Charges:  OT General Charges $OT Visit: 1 Procedure OT Evaluation $Initial OT Evaluation Tier I: 1 Procedure OT Treatments $Self Care/Home Management : 23-37 mins G-Codes:    Roney Jaffe Rayah Fines 10/25/13, 1:10 PM   [facility], OTR/L  [phone] 2013-10-25

## 2013-10-13 NOTE — Progress Notes (Signed)
Utilization review completed. Azlee Monforte, RN, BSN. 

## 2013-10-13 NOTE — Progress Notes (Signed)
VASCULAR LAB PRELIMINARY  ARTERIAL  ABI completed:    RIGHT    LEFT    PRESSURE WAVEFORM  PRESSURE WAVEFORM  BRACHIAL 128 triphasic BRACHIAL Restricted arm band   DP   DP    AT 85 triphasic AT 113 triphasic  PT 106 monophasic PT 101 biphasic  PER   PER    GREAT TOE  NA GREAT TOE  NA    RIGHT LEFT  ABI 0.83 0.88     Charlaine Dalton, RVT 10/13/2013, 9:59 AM

## 2013-10-13 NOTE — Progress Notes (Signed)
Thank you for consult on Sydney Phillips. She was admitted for Fem to tibioperoneal bypass on 10/12/13.  Difficulty to predict appropriate rehab needs with PT/OT evaluations pending. Her insurance will not justify a rehab stay for current diagnosis. Will defer CIR consult for now.

## 2013-10-14 MED ORDER — ENOXAPARIN SODIUM 30 MG/0.3ML ~~LOC~~ SOLN
30.0000 mg | SUBCUTANEOUS | Status: DC
  Administered 2013-10-15: 30 mg via SUBCUTANEOUS
  Filled 2013-10-14 (×2): qty 0.3

## 2013-10-14 MED ORDER — HYDROCODONE-ACETAMINOPHEN 10-325 MG PO TABS: 1.0000 | ORAL_TABLET | Freq: Four times a day (QID) | ORAL | Status: DC | PRN

## 2013-10-14 NOTE — Progress Notes (Signed)
Pt Foley removed per MD orders.  Pt aware she needs to voided by 12:30pm today 5/1.  Will share with day shift RN.  Call light in reach, RN will continue to monitor.   Nolon Nations, RN

## 2013-10-14 NOTE — Progress Notes (Signed)
Occupational Therapy Treatment Patient Details Name: Sydney Phillips MRN: [id number] DOB: 04-17-1936 Today's Date: 10/14/2013    History of present illness Right femoral to tibioperoneal trunk bypass with non-reversed ipsilateral great saphenous vein, intraop arteriogram   OT comments  This 78 yo female making progress slowly. Pt will need max-total A for LBB/D and min A for UBB/D, with min -mod A for transfers and bed mobility. If pt's family can manage this then home with [facility], if not then [place] SNF. Will continue to follow.  Follow Up Recommendations  Supervision/Assistance - 24 hour;Home health OT (may need SNF if family cannot manage her at this level)    Equipment Recommendations  None recommended by OT       Precautions / Restrictions Precautions Precautions: Fall Precaution Comments: RLE/multiple staples Restrictions Weight Bearing Restrictions: No       Mobility Bed Mobility Overal bed mobility: Needs Assistance Bed Mobility: Sit to Supine       Sit to supine: Mod assist      Transfers Overall transfer level: Needs assistance Equipment used: Rolling walker (2 wheeled) Transfers: Sit to/from Stand           General transfer comment: Mod A sit>stand (recliner and 3n1) min A stand>sit (3n1 and bed), pt moves very slowly. Pt needs consistent VCs for safe hand placement    Balance Overall balance assessment: Needs assistance Sitting-balance support: Feet supported;No upper extremity supported Sitting balance-Leahy Scale: Fair     Standing balance support: Bilateral upper extremity supported Standing balance-Leahy Scale: Poor                     ADL Overall ADL's : Needs assistance/impaired                         Toilet Transfer: Moderate assistance;Ambulation;BSC (over toilet)   Toileting- Clothing Manipulation and Hygiene: Total assistance;Sit to/from stand (for front hygiene)       Functional mobility during ADLs:  (Moderate A  sit>stand, min A stand>sit, min A with ambulation with RW)                  Cognition   Behavior During Therapy: Flat affect Overall Cognitive Status: No family/caregiver present to determine baseline cognitive functioning                                    Pertinent Vitals/ Pain       No c/o pain in RLE this session, just tired from making a trip to bathroom from recliner and back to bed.         Frequency Min 3X/week     Progress Toward Goals  OT Goals(current goals can now be found in the care plan section)  Progress towards OT goals: Progressing toward goals (ambulated to bathroom today; however very slow)     Plan Discharge plan needs to be updated       End of Session Equipment Utilized During Treatment: Gait belt;Rolling walker   Activity Tolerance Patient limited by fatigue   Patient Left in bed;with call bell/phone within reach   Nurse Communication  (leg wounds weeping; NT: pt "dribbled" a little bit in toilet; +1 A for most A sit>stand)        Time: 3710-6269 OT Time Calculation (min): 35 min  Charges: OT General Charges $OT Visit: 1 Procedure OT Treatments $Self  Care/Home Management : 23-37 mins  Almon Register [phone] 10/14/2013, 4:27 PM

## 2013-10-14 NOTE — Progress Notes (Addendum)
[facility] Progress Note  10/14/2013 8:27 AM 2 Days Post-Op  Subjective:  States pain is improved  Tm 99.4 now afelbrile VSS 94% 2LO2NC  Filed Vitals:   10/14/13 0507  BP: 106/59  Pulse: 79  Temp: 98.4 F (36.9 C)  Resp: 20    Physical Exam: Incisions:  Right groin is c/d/i.  Ecchymosis on right thigh.  Right thigh softer today.  Staples in tact. Extremities:  + palpable right DP; right foot is warm  CBC    Component Value Date/Time   WBC 12.1* 10/13/2013 0406   RBC 3.75* 10/13/2013 0406   HGB 11.2* 10/13/2013 0406   HCT 33.9* 10/13/2013 0406   PLT 246 10/13/2013 0406   MCV 90.4 10/13/2013 0406   MCH 29.9 10/13/2013 0406   MCHC 33.0 10/13/2013 0406   RDW 13.9 10/13/2013 0406   LYMPHSABS 1.9 05/05/2012 1651   MONOABS 0.8 05/05/2012 1651   EOSABS 0.2 05/05/2012 1651   BASOSABS 0.0 05/05/2012 1651    BMET    Component Value Date/Time   NA 136* 10/13/2013 0406   K 3.7 10/13/2013 0406   CL 98 10/13/2013 0406   CO2 22 10/13/2013 0406   GLUCOSE 139* 10/13/2013 0406   BUN 20 10/13/2013 0406   CREATININE 1.01 10/13/2013 0406   CREATININE 1.12* 01/10/2013 1745   CALCIUM 8.3* 10/13/2013 0406   GFRNONAA 53* 10/13/2013 0406   GFRAA 62* 10/13/2013 0406    INR    Component Value Date/Time   INR 0.95 10/07/2013 1029     Intake/Output Summary (Last 24 hours) at 10/14/13 0827 Last data filed at 10/14/13 0600  Gross per 24 hour  Intake    150 ml  Output   1750 ml  Net  -1600 ml     Assessment:  78 y.o. female is s/p:  Right femoral to tibioperoneal trunk bypass with non-reversed ipsilateral great saphenous vein, intraop arteriogram   2 Days Post-Op  Plan: -pt doing well this am with palpable right DP -pain is improved this morning -dry dressing to right groin daily and as needed to help wick moisture and help prevent infection -states she walked a little with PT yesterday -OOB to chair tid with meals and increase mobilization -DVT prophylaxis:  Will hold  Lovenox one more day for bleeding in right thigh.  Pt is on Plavix. -insurance will not cover CIR with current diagnosis   Leontine Locket, PA-C [facility] [phone] 10/14/2013 8:27 AM    Much more mobile Foot warm doppler signals Incisions healing thigh area soft no change from yesterday Will d/c home tomorrow.  Follow up 2 weeks  Ruta Hinds, MD [facility] [place] Office: [phone] Pager: [phone]

## 2013-10-14 NOTE — Progress Notes (Signed)
Physical Therapy Treatment Patient Details Name: Sydney Phillips MRN: [id number] DOB: Phillips 14, 1937 Today's Date: 10/14/2013    History of Present Illness Right femoral to tibioperoneal trunk bypass with non-reversed ipsilateral great saphenous vein, intraop arteriogram    PT Comments    Pt admitted with above. Pt currently with functional limitations due to balance and endurance deficits. Pt will benefit from skilled PT to increase their independence and safety with mobility to allow discharge to the venue listed below.   Follow Up Recommendations  Home health PT;Supervision/Assistance - 24 hour     Equipment Recommendations  None recommended by PT    Recommendations for Other Services       Precautions / Restrictions Precautions Precautions: Fall Precaution Comments: RLE/multiple staples Restrictions Weight Bearing Restrictions: No    Mobility  Bed Mobility Overal bed mobility: Needs Assistance Bed Mobility: Supine to Sit     Supine to sit: Min assist Sit to supine: Mod assist   General bed mobility comments: assist for elevation of trunk  Transfers Overall transfer level: Needs assistance Equipment used: Rolling walker (2 wheeled) Transfers: Sit to/from Stand Sit to Stand: Min assist         General transfer comment: min assit sit to stand from bed.  Pt needs cues for hand placement.    Ambulation/Gait Ambulation/Gait assistance: Min assist Ambulation Distance (Feet): 180 Feet Assistive device: Rolling walker (2 wheeled) Gait Pattern/deviations: Step-through pattern;Decreased stride length;Trunk flexed;Drifts right/left Gait velocity: decreased Gait velocity interpretation: Below normal speed for age/gender General Gait Details: VCs for position within RW, increased cues for upright posture, patient very slow to get moving but once she is up, she ambulated with min guard   Stairs            Wheelchair Mobility    Modified Rankin (Stroke Patients  Only)       Balance Overall balance assessment: Needs assistance;History of Falls Sitting-balance support: No upper extremity supported;Feet supported Sitting balance-Leahy Scale: Fair     Standing balance support: Bilateral upper extremity supported;During functional activity Standing balance-Leahy Scale: Poor Standing balance comment: Needs UE support for standing balance and steadying assist at times with rW.                     Cognition Arousal/Alertness: Awake/alert Behavior During Therapy: Flat affect Overall Cognitive Status: No family/caregiver present to determine baseline cognitive functioning                      Exercises General Exercises - Lower Extremity Ankle Circles/Pumps: AROM;Both;10 reps;Seated Long Arc Quad: AROM;Both;10 reps;Seated Hip Flexion/Marching: AROM;Both;10 reps;Seated    General Comments        Pertinent Vitals/Pain VSS, no pain    Home Living                      Prior Function            PT Goals (current goals can now be found in the care plan section) Progress towards PT goals: Progressing toward goals    Frequency  Min 3X/week    PT Plan Frequency needs to be updated    Co-evaluation             End of Session Equipment Utilized During Treatment: Gait belt Activity Tolerance: Patient limited by fatigue Patient left: in chair;with call bell/phone within reach     Time: 1037-1105 PT Time Calculation (min): 28 min  Charges:  $Gait Training:  8-22 mins $Therapeutic Exercise: 8-22 mins                    G Codes:      Christianne Dolin 11-05-13, 5:08 PM  [facility] [phone] (pager)

## 2013-10-15 ENCOUNTER — Encounter (HOSPITAL_COMMUNITY): Payer: Self-pay | Admitting: *Deleted

## 2013-10-15 LAB — CBC
HEMATOCRIT: 27.6 % — AB (ref 36.0–46.0)
Hemoglobin: 9.1 g/dL — ABNORMAL LOW (ref 12.0–15.0)
MCH: 30.5 pg (ref 26.0–34.0)
MCHC: 33 g/dL (ref 30.0–36.0)
MCV: 92.6 fL (ref 78.0–100.0)
Platelets: 233 10*3/uL (ref 150–400)
RBC: 2.98 MIL/uL — ABNORMAL LOW (ref 3.87–5.11)
RDW: 14 % (ref 11.5–15.5)
WBC: 10.7 10*3/uL — AB (ref 4.0–10.5)

## 2013-10-15 LAB — GLUCOSE, CAPILLARY: Glucose-Capillary: 172 mg/dL — ABNORMAL HIGH (ref 70–99)

## 2013-10-15 LAB — BASIC METABOLIC PANEL
BUN: 28 mg/dL — AB (ref 6–23)
CHLORIDE: 99 meq/L (ref 96–112)
CO2: 25 meq/L (ref 19–32)
CREATININE: 1.3 mg/dL — AB (ref 0.50–1.10)
Calcium: 8.7 mg/dL (ref 8.4–10.5)
GFR calc non Af Amer: 39 mL/min — ABNORMAL LOW (ref >90)
GFR, EST AFRICAN AMERICAN: 45 mL/min — AB (ref >90)
Glucose, Bld: 143 mg/dL — ABNORMAL HIGH (ref 70–99)
POTASSIUM: 4.1 meq/L (ref 3.7–5.3)
Sodium: 139 mEq/L (ref 137–147)

## 2013-10-15 LAB — TROPONIN I
Troponin I: <0.3 ng/mL (ref <0.30)
Troponin I: <0.3 ng/mL (ref <0.30)
Troponin I: <0.3 ng/mL (ref <0.30)

## 2013-10-15 MED ORDER — ALPRAZOLAM 0.5 MG PO TABS: 0.5000 mg | ORAL_TABLET | Freq: Every evening | ORAL | Status: DC | PRN

## 2013-10-15 MED ORDER — SODIUM CHLORIDE 0.9 % IV SOLN
INTRAVENOUS | Status: DC
  Administered 2013-10-15: 1000 mL via INTRAVENOUS

## 2013-10-15 NOTE — Progress Notes (Signed)
Son presented to RN concerned about mothers mental status. Informed son no pain med other than tylenol had been given today, however, she did receive her xanax. RN went into the room withson to assess patient and obtain BP for meds to be given. Patient found in chair difficult to arouse, with 02 at 1L. Patient would answer question and fall back to sleep immediately. BP 77/48 manual, HR 80 Vpaced, 02 sat 90%. Dr. Oneida Alar notified, rapid response called. 2nd RN took manual BP 80/40, CBG 172, 02 increased to 2L. Patient moved back to bed x 3 assist. Rapid response nurse present and assessed patient. Dr. Oneida Alar presented to the floor to assess as well. Orders for labs, EKG and IVF Bolus placed and completed. IV fluids administered at 967ml/hr as requested. New IV site obtained in right forearm due to leaking at site in hand. BP up to 92/50, HR still 80 AVpaced. Patient more alert and arousable after fluids infused but still very sleepy.  Clovis Riley, RN

## 2013-10-15 NOTE — Significant Event (Signed)
Rapid Response Event Note  Overview: Time Called: 1130 Arrival Time: 1132 Event Type: Hypotension  Initial Focused Assessment:  Called by primary RN for patient with hypotension and unresponsive.  Upon my arrival, family and Rn at bedside.  Patient lying in bed with nasal cannula on and alert and conversant.  As per RN, SBP  80/40 and patient  Hard to arouse.  Dr Oneida Alar already notified and orders recieved   Interventions:  Spoke to patient and explained situation and tests to be done going foward, Dr. Oneida Alar at bedside and explained the same.  Fluid bolus to be given, labs and EKG to be done.   RN to call if assistance needed   Event Summary:   at      at          [facility]

## 2013-10-15 NOTE — Progress Notes (Signed)
Pt noted to be lethargic with hypotension.  BP 80s.  Will delay d/c for today.  Will send cardiac enzymes check EKG Check BMET and CBC Will bolus with IV fluids  Ruta Hinds, MD [facility] [place] Office: [phone] Pager: [phone]

## 2013-10-15 NOTE — Progress Notes (Signed)
Patient much more alert and coherent. BP stable 90's/50's, vpaced. Sons and wife at bedside and state patient is much more aware of situation. Patient up with 1-2 assist. Spoke with patient who states she only takes xanax prn at dinner and qhs 0.5-1mg . Spoke with pharmacist who confirmed with [facility] where patient has meds filled that it is ordered for QID. Patient and family state she does not take QID and only prn. Notified Dr. Oneida Alar who authorized change of order to 0.5-1mg  prn qhs ONLY and to hold tonights dose. Will continue to monitor and assess regularly.   Clovis Riley, RN

## 2013-10-15 NOTE — Progress Notes (Signed)
[facility] [place]  Subjective  - Feels ok   Objective 119/60 83 97.9 F (36.6 C) (Oral) 21 92%  Intake/Output Summary (Last 24 hours) at 10/15/13 8101 Last data filed at 10/15/13 0800  Gross per 24 hour  Intake    480 ml  Output    450 ml  Net     30 ml   Leg incisions healing some mild edema Foot warm  Assessment/Planning: Doing well s/p fem TPT bypass D/c home today Spoke with son regarding home care  Elam Dutch 10/15/2013 9:29 AM --  Laboratory Lab Results:  Recent Labs  10/12/13 1849 10/13/13 0406  WBC 17.5* 12.1*  HGB 12.0 11.2*  HCT 35.8* 33.9*  PLT 276 246   BMET  Recent Labs  10/12/13 1849 10/13/13 0406  NA  --  136*  K  --  3.7  CL  --  98  CO2  --  22  GLUCOSE  --  139*  BUN  --  20  CREATININE 1.02 1.01  CALCIUM  --  8.3*    COAG Lab Results  Component Value Date   INR 0.95 10/07/2013   INR 1.03 07/31/2011   INR 0.96 10/29/2009   No results found for this basename: PTT

## 2013-10-16 MED ORDER — ALPRAZOLAM 1 MG PO TABS: 0.5000 mg | ORAL_TABLET | Freq: Every evening | ORAL | Status: DC | PRN

## 2013-10-16 NOTE — Progress Notes (Signed)
Pt was receiving more Xanax than she usually takes at home. Much more alert late yesterday and today   Filed Vitals:   10/15/13 1405 10/15/13 1828 10/15/13 2020 10/16/13 0420  BP: 92/59 113/69 137/69 147/67  Pulse: 82 80 81 86  Temp: 98 F (36.7 C)  97.9 F (36.6 C) 98.4 F (36.9 C)  TempSrc: Oral  Oral Oral  Resp: 18  20 21   Height:      Weight:      SpO2: 94%  92% 95%    Incisions healing foot warm  D/c home  Ruta Hinds, MD [facility] [place] Office: [phone] Pager: [phone]

## 2013-10-16 NOTE — Progress Notes (Signed)
Assessment unchanged. Discussed D/C instructions with pt and family including f/u appointments, medication changes, and incision care. Verbalized understanding. IV and tele removed. Pt left via W/C accompanied by RN with belongings.

## 2013-10-17 ENCOUNTER — Other Ambulatory Visit: Payer: Self-pay | Admitting: *Deleted

## 2013-10-17 DIAGNOSIS — R531 Weakness: Secondary | ICD-10-CM

## 2013-10-17 NOTE — Care Management Note (Unsigned)
    Page 1 of 1   10/17/2013     9:11:33 AM CARE MANAGEMENT NOTE 10/17/2013  Patient:  Sydney Phillips, Sydney Phillips   Account Number:  [id number]  Date Initiated:  10/14/2013  Documentation initiated by:  Jailynn Lavalais  Subjective/Objective Assessment:   Pt s/p fem pop bypass on 4/29.     Action/Plan:   Anticipated DC Date:  10/16/2013   Anticipated DC Plan:  HOME/SELF CARE      DC Planning Services  CM consult      Choice offered to / List presented to:             Status of service:  Completed, signed off Medicare Important Message given?  YES (If response is "NO", the following Medicare IM given date fields will be blank) Date Medicare IM given:  11/06/2013 Date Additional Medicare IM given:  10/14/2013  Discharge Disposition:  HOME/SELF CARE  Per UR Regulation:  Reviewed for med. necessity/level of care/duration of stay  If discussed at [place] of Stay Meetings, dates discussed:    Comments:

## 2013-10-18 ENCOUNTER — Other Ambulatory Visit: Payer: Self-pay | Admitting: Cardiovascular Disease

## 2013-10-18 ENCOUNTER — Other Ambulatory Visit: Payer: Self-pay | Admitting: *Deleted

## 2013-10-18 ENCOUNTER — Encounter: Payer: Self-pay | Admitting: Cardiology

## 2013-10-18 DIAGNOSIS — Z029 Encounter for administrative examinations, unspecified: Secondary | ICD-10-CM

## 2013-10-18 MED ORDER — LISINOPRIL 20 MG PO TABS: 20.0000 mg | ORAL_TABLET | Freq: Every day | ORAL | Status: DC

## 2013-10-18 MED ORDER — METOPROLOL TARTRATE 25 MG PO TABS: 25.0000 mg | ORAL_TABLET | Freq: Every day | ORAL | Status: DC

## 2013-10-18 NOTE — Progress Notes (Signed)
Remote CRT-D device check. Thresholds and sensing consistent with previous device measurements. Lead impedance trends stable over time RA and LV.  RV lead impedance 836 ohms in January and is now 1026ohms, threshold and R- waves remain stable.   No mode switch episodes recorded. No ventricular arrhythmia episodes recorded. Patient bi-ventricularly pacing >99.9 % of the time. Device programmed with appropriate safety margins. Heart failure diagnostics reviewed and trends are stable for patient.  Next remote 01/09/14.

## 2013-10-20 ENCOUNTER — Telehealth: Payer: Self-pay | Admitting: Family Medicine

## 2013-10-20 NOTE — Telephone Encounter (Addendum)
They would like to inquire about pt's potassium and how much pt should be on. Pt gets constipated very much. Pt dc'd from hospital and kidney md changed her med.  After further investigation, Dr Sarajane Jews did not rx this med for pt. Advised [facility] to call urologist.

## 2013-10-21 ENCOUNTER — Emergency Department (HOSPITAL_COMMUNITY)
Admission: EM | Admit: 2013-10-21 | Discharge: 2013-10-21 | Disposition: A | Discharge status: Home / Self Care | Payer: Medicare Other | Attending: Emergency Medicine | Admitting: Emergency Medicine

## 2013-10-21 ENCOUNTER — Encounter (HOSPITAL_COMMUNITY): Payer: Self-pay | Admitting: Emergency Medicine

## 2013-10-21 DIAGNOSIS — F411 Generalized anxiety disorder: Secondary | ICD-10-CM | POA: Insufficient documentation

## 2013-10-21 DIAGNOSIS — Z95810 Presence of automatic (implantable) cardiac defibrillator: Secondary | ICD-10-CM | POA: Insufficient documentation

## 2013-10-21 DIAGNOSIS — Z860100 Personal history of colon polyps, unspecified: Secondary | ICD-10-CM | POA: Insufficient documentation

## 2013-10-21 DIAGNOSIS — Z79899 Other long term (current) drug therapy: Secondary | ICD-10-CM | POA: Insufficient documentation

## 2013-10-21 DIAGNOSIS — F17200 Nicotine dependence, unspecified, uncomplicated: Secondary | ICD-10-CM | POA: Insufficient documentation

## 2013-10-21 DIAGNOSIS — Y838 Other surgical procedures as the cause of abnormal reaction of the patient, or of later complication, without mention of misadventure at the time of the procedure: Secondary | ICD-10-CM | POA: Insufficient documentation

## 2013-10-21 DIAGNOSIS — E785 Hyperlipidemia, unspecified: Secondary | ICD-10-CM | POA: Insufficient documentation

## 2013-10-21 DIAGNOSIS — Z7982 Long term (current) use of aspirin: Secondary | ICD-10-CM | POA: Insufficient documentation

## 2013-10-21 DIAGNOSIS — J449 Chronic obstructive pulmonary disease, unspecified: Secondary | ICD-10-CM | POA: Insufficient documentation

## 2013-10-21 DIAGNOSIS — M129 Arthropathy, unspecified: Secondary | ICD-10-CM | POA: Insufficient documentation

## 2013-10-21 DIAGNOSIS — F3289 Other specified depressive episodes: Secondary | ICD-10-CM | POA: Insufficient documentation

## 2013-10-21 DIAGNOSIS — Z880 Allergy status to penicillin: Secondary | ICD-10-CM | POA: Insufficient documentation

## 2013-10-21 DIAGNOSIS — Z9861 Coronary angioplasty status: Secondary | ICD-10-CM | POA: Insufficient documentation

## 2013-10-21 DIAGNOSIS — G8929 Other chronic pain: Secondary | ICD-10-CM | POA: Insufficient documentation

## 2013-10-21 DIAGNOSIS — Z8669 Personal history of other diseases of the nervous system and sense organs: Secondary | ICD-10-CM | POA: Insufficient documentation

## 2013-10-21 DIAGNOSIS — Z87442 Personal history of urinary calculi: Secondary | ICD-10-CM | POA: Insufficient documentation

## 2013-10-21 DIAGNOSIS — J4489 Other specified chronic obstructive pulmonary disease: Secondary | ICD-10-CM | POA: Insufficient documentation

## 2013-10-21 DIAGNOSIS — I5030 Unspecified diastolic (congestive) heart failure: Secondary | ICD-10-CM | POA: Insufficient documentation

## 2013-10-21 DIAGNOSIS — I10 Essential (primary) hypertension: Secondary | ICD-10-CM | POA: Insufficient documentation

## 2013-10-21 DIAGNOSIS — IMO0002 Reserved for concepts with insufficient information to code with codable children: Secondary | ICD-10-CM

## 2013-10-21 DIAGNOSIS — Z7902 Long term (current) use of antithrombotics/antiplatelets: Secondary | ICD-10-CM | POA: Insufficient documentation

## 2013-10-21 DIAGNOSIS — Z8719 Personal history of other diseases of the digestive system: Secondary | ICD-10-CM | POA: Insufficient documentation

## 2013-10-21 DIAGNOSIS — Z9889 Other specified postprocedural states: Secondary | ICD-10-CM | POA: Insufficient documentation

## 2013-10-21 DIAGNOSIS — I2510 Atherosclerotic heart disease of native coronary artery without angina pectoris: Secondary | ICD-10-CM | POA: Insufficient documentation

## 2013-10-21 DIAGNOSIS — F329 Major depressive disorder, single episode, unspecified: Secondary | ICD-10-CM | POA: Insufficient documentation

## 2013-10-21 NOTE — ED Notes (Signed)
Pt recently here for bypass surgery on right leg and sts there is an area at the top of the wound that is bleeding. Denise pain. sts slow bleeding. sts stsrted after dressing was removed yesterday.

## 2013-10-21 NOTE — Discharge Instructions (Signed)
If the area starts bleeding again, hold constant pressure for at least 10-15 minutes and it should stop bleeding. You can also use an ice pack on the area. It should be rechecked if you have heavy bleeding, blood is running down your leg, or you feel weak or dizzy.

## 2013-10-21 NOTE — ED Provider Notes (Signed)
CSN: [id number]     Arrival date & time 10/21/13  2778 History   First MD Initiated Contact with Patient 10/21/13 1102     Chief Complaint  Patient presents with  . Leg Problem     (Consider location/radiation/quality/duration/timing/severity/associated sxs/prior Treatment) HPI Patient had `fem pop bypass surgery done on her right leg on April 29. She relates she's been doing well. She has been ambulatory without using a cane or walker for one to 2 days. She states yesterday her physical therapist changed her dressing and since then she started having some bleeding at one of her incision sites. She states she changed her dressing at 2 AM before she went to bed. She reports that 8 AM she got up to use the bathroom and noted there were some blood on the dressing. She states she normally doesn't get up to 2 PM. She denies feeling dizzy, weakness, having pain, fever. She states she's walking fine. Patient had been on Plavix prior to her surgery which she restarted 5 days ago. She states she called Dr Nona Dell office and they told her to come to the ED. patient has a towel that she use to put on the area and she was being driven to the ED and there are 2 small spots of blood about the size of a quarter.  PCP Dr Barbie Banner [facility] Dr Oneida Alar  Past Medical History  Diagnosis Date  . COPD (chronic obstructive pulmonary disease)   . Hx of colonic polyps   . Insomnia   . Fibromyalgia   . Hyperlipemia     takes Crestor daily  . CAD (coronary artery disease)     Currently angina free, no evidence of reversible ischemia  . Vertigo     takes Meclizine prn  . Subclavian arterial stenosis, lt, with PTA/STENT 07/31/11 08/01/2011  . S/P angioplasty with stent, lt. subclavian 07/31/11 08/01/2011  . Hypertension     takes Amlodipine and Metoprolol daily  . Bronchitis   . Arthritis   . Chronic back pain   . GERD (gastroesophageal reflux disease)     was on meds but was taken off;now watches what she eats  .  Chronic constipation   . Hemorrhoids   . Urinary incontinence   . Early cataracts, bilateral   . Anxiety   . Depression     takes Cymbalta daily  . Congestive heart failure (CHF)     New York Heart Association functional class 2, diastolic dysfunction  . PAD (peripheral artery disease)     Carotid, subclavian, and lower extremity beds, currently not symptomatic  . Syncope 07/28/2011    EF - 50-55, moderate concentric hypertrophy in left ventricle  . LBBB (left bundle branch block)     Stress test 09/03/2010, EF 55  . Nephrolithiasis   . Automatic implantable cardioverter-defibrillator in situ   . Pacemaker   . Shortness of breath     when over exerting self per pt.   Past Surgical History  Procedure Laterality Date  . Appendectomy    . Tonsillectomy    . Tubal ligation    . Cardiac defibrillator placement  05/2008    By Dr Blanch Media, [employer] CANNOT HAVE MRI's  . Abdominal hysterectomy    . Back surgery  2012  . Orif elbow fracture  08/16/2011    Procedure: OPEN REDUCTION INTERNAL FIXATION (ORIF) ELBOW/OLECRANON FRACTURE;  Surgeon: Schuyler Amor, MD;  Location: [facility];  Service: Orthopedics;  Laterality: Left;  . Cardiac catheterization  12/01/2007    By Dr. Melvern Banker, left heart cath,   . Coronary angioplasty    . Subclavian stent placement Left 07/31/2011    7x18 [employer], balloon, with reduction of 90% ostial left subclavian artery stenosis to 0% with residual excellent flow  . Femoral-popliteal bypass graft Right 10/12/2013    Procedure:   Femoral-Peroneal trunk  bypass with nonreversed greater saphenous vein graft;  Surgeon: Elam Dutch, MD;  Location: [facility];  Service: Vascular;  Laterality: Right;  . Intraoperative arteriogram Right 10/12/2013    Procedure: INTRA OPERATIVE ARTERIOGRAM;  Surgeon: Elam Dutch, MD;  Location: [facility];  Service: Vascular;  Laterality: Right;   Family History  Problem Relation Age of Onset  . Hyperlipidemia    . Anesthesia problems Neg Hx    . Hypotension Neg Hx   . Malignant hyperthermia Neg Hx   . Pseudochol deficiency Neg Hx   . Colon cancer Maternal Grandmother   . Cancer Sister     ovarian  . Diabetes Sister   . Heart disease Sister   . CAD Father   . Heart disease Father   . Hyperlipidemia Father   . Heart disease Mother   . Deep vein thrombosis Son    History  Substance Use Topics  . Smoking status: Current Every Day Smoker -- 0.50 packs/day for 60 years    Types: Cigarettes  . Smokeless tobacco: Never Used     Comment: 3 cigarettes per day  . Alcohol Use: No   Son lives with patient Quit smoking on 4/29  OB History   Grav Para Term Preterm Abortions TAB SAB Ect Mult Living                 Review of Systems  All other systems reviewed and are negative.     Allergies  Codeine; Darvon; Meloxicam; Potassium-containing compounds; Propoxyphene n-acetaminophen; Rofecoxib; Azithromycin; Erythromycin; Penicillins; and Sulfa antibiotics  Home Medications   Prior to Admission medications   Medication Sig Start Date End Date Taking? Authorizing Provider  ALPRAZolam Duanne Moron) 1 MG tablet Take 0.5 tablets (0.5 mg total) by mouth at bedtime as needed for anxiety. 10/16/13   Elam Dutch, MD  aspirin 325 MG tablet Take 81.25 mg by mouth daily. Take 1/4 tablet daily.    Historical Provider, MD  clopidogrel (PLAVIX) 75 MG tablet Take 1 tablet (75 mg total) by mouth daily. 10/27/12   Laurey Morale, MD  DULoxetine (CYMBALTA) 60 MG capsule Take 60 mg by mouth 2 (two) times daily.    Historical Provider, MD  furosemide (LASIX) 80 MG tablet Take 80 mg by mouth 2 (two) times daily. 04/20/13   Laurey Morale, MD  HYDROcodone-acetaminophen (NORCO) 10-325 MG per tablet Take 1 tablet by mouth every 6 (six) hours as needed for moderate pain. 10/14/13   Samantha J Rhyne, PA-C  lisinopril (PRINIVIL,ZESTRIL) 20 MG tablet Take 1 tablet (20 mg total) by mouth daily. 10/18/13   Elam Dutch, MD  metoprolol tartrate (LOPRESSOR) 25  MG tablet Take 1 tablet (25 mg total) by mouth daily. 10/18/13   Elam Dutch, MD  potassium chloride SA (K-DUR,KLOR-CON) 20 MEQ tablet Take 20 mEq by mouth 2 (two) times daily.    Historical Provider, MD   BP 126/56  Pulse 75  Temp(Src) 98.4 F (36.9 C) (Oral)  Resp 18  Ht 5\' 5"  (1.651 m)  Wt 183 lb (83.008 kg)  BMI 30.45 kg/m2  SpO2 96%  Vital signs normal  Physical Exam  Nursing note and vitals reviewed. Constitutional: She is oriented to person, place, and time. She appears well-developed and well-nourished.  Non-toxic appearance. She does not appear ill. No distress.  HENT:  Head: Normocephalic and atraumatic.  Right Ear: External ear normal.  Left Ear: External ear normal.  Nose: Nose normal. No mucosal edema or rhinorrhea.  Mouth/Throat: Oropharynx is clear and moist and mucous membranes are normal. No dental abscesses or uvula swelling.  Eyes: Conjunctivae and EOM are normal. Pupils are equal, round, and reactive to light.  Neck: Normal range of motion and full passive range of motion without pain. Neck supple.  Pulmonary/Chest: Effort normal and breath sounds normal. No respiratory distress. She has no rhonchi. She exhibits no crepitus.  Abdominal: Soft. Normal appearance and bowel sounds are normal.  Musculoskeletal: Normal range of motion. She exhibits no edema and no tenderness.  Moves all extremities well.  Still has staples in her medial right leg.  At first it appeared the proximal surgical incision that is seen in the first and second photo was the area of bleeding proximally however once I wiped the blood off of it it did not rebleed. The bleeding is actually from the more posterior middle incision in the third photo. It is also proximally located. When I wiped the blood from it took a long time for the blood to start using it again. I have patient place her index finger in the area to apply pressure  See photo  Neurological: She is alert and oriented to person,  place, and time. She has normal strength. No cranial nerve deficit.  Skin: Skin is warm, dry and intact. No rash noted. No erythema. No pallor.  Psychiatric: She has a normal mood and affect. Her speech is normal and behavior is normal. Her mood appears not anxious.           ED Course  Procedures (including critical care time)  12:45 pt has been holding pressure on the area. The guaze has a small speck of dried blood.  Will place a dressing on the area and have her walk to make sure it doesn't rebleed.   Dressing was applied to her surgical site. Patient was ambulated by nursing staff. There was no rebleeding. Patient is being discharged.  Labs Review Labs Reviewed - No data to display  Imaging Review No results found.   EKG Interpretation None      MDM   Final diagnoses:  Post-op bleeding     Plan discharge   Rolland Porter, MD, Alanson Aly, MD 10/21/13 1315

## 2013-10-21 NOTE — ED Notes (Signed)
Dr. Knapp at the bedside.  

## 2013-10-21 NOTE — ED Notes (Signed)
Patient got up and walked down the hallway per MD order. Patient had a steady gait and complained of no pain. No bleeding noted at the surgical sight after patient was placed back in the bed and on the monitor.

## 2013-10-24 ENCOUNTER — Telehealth: Payer: Self-pay | Admitting: Family Medicine

## 2013-10-24 MED ORDER — CLOPIDOGREL BISULFATE 75 MG PO TABS: 75.0000 mg | ORAL_TABLET | Freq: Every day | ORAL | Status: DC

## 2013-10-24 NOTE — Telephone Encounter (Signed)
Pt would like to request a refill of clopidogrel (PLAVIX) 75 MG tablet.  Pt just refilled rx and there will be no more refills after 5/14 and she doesn't want to get to pharm next mo and have no refills. Pt also wants to know should she continue to take this med when her leg is bleeding a little bit around the staple.  Hospital told her to apply pressure on the incision. But pt states she cannot keep her hand on it and do anything else. Pt has fup w/ surgeon on thurs, but pt would like to know does she continue this med while its still bleeding a little.

## 2013-10-24 NOTE — Telephone Encounter (Signed)
I spoke with pt and sent script e-scribe. 

## 2013-10-24 NOTE — Telephone Encounter (Signed)
Yes she should stay on Plavix every day. The post op bleeding sounds to be very minimal. Call in refills for one year

## 2013-10-26 ENCOUNTER — Encounter: Payer: Self-pay | Admitting: Vascular Surgery

## 2013-10-27 ENCOUNTER — Encounter: Payer: Self-pay | Admitting: Vascular Surgery

## 2013-10-27 ENCOUNTER — Ambulatory Visit (INDEPENDENT_AMBULATORY_CARE_PROVIDER_SITE_OTHER): Payer: Self-pay | Admitting: Vascular Surgery

## 2013-10-27 VITALS — BP 133/66 | HR 73 | Ht 65.0 in | Wt 184.0 lb

## 2013-10-27 DIAGNOSIS — I739 Peripheral vascular disease, unspecified: Secondary | ICD-10-CM

## 2013-10-27 NOTE — Progress Notes (Signed)
Patient is a 78 year old female returns for followup today after right femoral to tibial peroneal trunk bypass with vein on April 29. She has one-vessel runoff via the peroneal. She states that her claudication symptoms have completely resolved. She did have a small hematoma in her right thigh but has had no further accumulation of this. She was seen in the emergency room recently from some oozing at this incision but this has resolved as well.  Physical exam:  Filed Vitals:   10/27/13 0917  BP: 133/66  Pulse: 73  Height: 5\' 5"  (1.651 m)  Weight: 184 lb (83.462 kg)  SpO2: 98%    Right lower extremity: Small amount maceration at the inferior groin with some slight separation less than 1 cm depth wound 1 cm diameter, right foot is warm with good posterior tibial Doppler signal, all other incisions right leg healing hematoma right medial thigh 4 x 4 centimeter diameter  Assessment: Patent right lower extremity bypass graft some maceration of right groin incision which should heal spontaneously all staples were removed today Plan: Followup in 3 months with arterial duplex scan  Ruta Hinds, MD [facility] Office: [phone] Pager: [phone]

## 2013-11-01 ENCOUNTER — Telehealth: Payer: Self-pay | Admitting: *Deleted

## 2013-11-01 NOTE — Telephone Encounter (Signed)
[facility] nurse called to report that patient's hematoma was resolving, even though she was having more bloody oozing (Dr. Sarajane Jews just started her back on Plavix 10-24-13). Cain Saupe wanted to know if they could do a wet to dry dressing on the groin wound. Patient is afebrile, foot is warm, thigh incision is looking good and she has good pulses. I instructed her that wet to dry dressing is fine for the groin and just dry dressing on the incision. She will call back if any other issues arise.

## 2013-11-13 NOTE — Discharge Summary (Signed)
[facility] Discharge Summary  Sydney Phillips [birth year] 78 y.o. female  [id number]  Admission Date: 10/12/2013  Discharge Date: 10/16/13  Physician: No att. providers found  Admission Diagnosis: PVD with claudication   HPI:   This is a 78 y.o. female who presents for evaluation of right leg numbness and weakness and claudication symptoms. This is been present for approximately 6 months. She thinks she also has some early symptoms similar to this in the left leg. She denies rest pain. She denies nonhealing wounds on the foot. She states that in the past she thinks she may have had some sort of stent put in her leg by Dr. Gwenlyn Found. I was unable to find details regarding this in her EPIC chart today. She did have a left subclavian stent placed by Dr. Alvester Chou several years ago this was done for dizziness. Other medical problems include COPD, hyperlipidemia, coronary disease, hypertension, depression, CHF and has an AICD pacer. She currently smokes half a pack of cigarettes per day. Greater than 3 minutes they spent regarding smoking cessation counseling. She is on Plavix and aspirin. However she stopped her Plavix 10 days ago and has yet to refill her prescription. I recently performed an arteriogram on her which showed significant distal right superficial and popliteal artery occlusive disease. She primarily has peroneal runoff. She has tried conservative measures for the last 6 weeks a walking program and still states that she has difficulty walking and wishes to undergo revascularization at this point. He was explained to her today that she is not at risk of limb loss. Risks benefits and possible complications of the vas relation were explained the patient today including the possibility of increasing her chances of limb loss. Also it was explained to her that durability of any bypass procedure would be limited if she continues to smoke.  Hospital Course:  The patient was  admitted to the hospital and taken to the operating room on 10/12/2013 and underwent: Right femoral to tibioperoneal trunk bypass with non-reversed ipsilateral great saphenous vein, intraop arteriogram.   The pt tolerated the procedure well and was transported to the PACU in good condition. Later that night, Dr. Oneida Alar was called to see pt about hematoma in the right upper thigh.  She was found to have a small hematoma right upper thigh saphenectomy site.  There was no skin compromise and no evidence of arterial bleeding.  By POD 1, she was needing assistance for ambulation-a PT/OT and CIR consult was obtained.  CIR was not approved by her health insurance and PT recommended home health with home PT.  Her postoperative ABI;s are as follows:  RIGHT    LEFT     PRESSURE  WAVEFORM   PRESSURE  WAVEFORM   BRACHIAL  128  triphasic  BRACHIAL  Restricted arm band    DP    DP     AT  85  triphasic  AT  113  triphasic   PT  106  monophasic  PT  101  biphasic   PER    PER     GREAT TOE   NA  GREAT TOE   NA     RIGHT  LEFT   ABI  0.83  0.88    By POD 2, she was doing well and was much more mobile.    She was to be discharged on POD 3, however, she became more lethargic and hypotensive and d/c was cancelled.  Cardiac enzymes were drawn and were  negative x 3.  She was found to have been taking more Xanax than she usually takes at home and by the next day was much more alert.  She was discharged home.  The remainder of the hospital course consisted of increasing mobilization and increasing intake of solids without difficulty.  CBC    Component Value Date/Time   WBC 10.7* 10/15/2013 1250   RBC 2.98* 10/15/2013 1250   HGB 9.1* 10/15/2013 1250   HCT 27.6* 10/15/2013 1250   PLT 233 10/15/2013 1250   MCV 92.6 10/15/2013 1250   MCH 30.5 10/15/2013 1250   MCHC 33.0 10/15/2013 1250   RDW 14.0 10/15/2013 1250   LYMPHSABS 1.9 05/05/2012 1651   MONOABS 0.8 05/05/2012 1651   EOSABS 0.2 05/05/2012 1651   BASOSABS 0.0  05/05/2012 1651    BMET    Component Value Date/Time   NA 139 10/15/2013 1250   K 4.1 10/15/2013 1250   CL 99 10/15/2013 1250   CO2 25 10/15/2013 1250   GLUCOSE 143* 10/15/2013 1250   BUN 28* 10/15/2013 1250   CREATININE 1.30* 10/15/2013 1250   CREATININE 1.12* 01/10/2013 1745   CALCIUM 8.7 10/15/2013 1250   GFRNONAA 39* 10/15/2013 1250   GFRAA 45* 10/15/2013 1250     Discharge Instructions:   The patient is discharged to home with extensive instructions on wound care and progressive ambulation.  They are instructed not to drive or perform any heavy lifting until returning to see the physician in his office.  Discharge Instructions   Call MD for:  redness, tenderness, or signs of infection (pain, swelling, bleeding, redness, odor or green/yellow discharge around incision site)    Complete by:  As directed      Call MD for:  severe or increased pain, loss or decreased feeling  in affected limb(s)    Complete by:  As directed      Call MD for:  temperature >100.5    Complete by:  As directed      Discharge wound care:    Complete by:  As directed   Wash the groin wound with soap and water daily and pat dry. (No tub bath-only shower)  Then put a dry gauze or washcloth there to keep this area dry daily and as needed to help prevent infection.  Do not use Vaseline or neosporin on your incisions.  Only use soap and water on your incisions and then protect and keep dry.     Driving Restrictions    Complete by:  As directed   No driving for 2 weeks     Lifting restrictions    Complete by:  As directed   No lifting for 6 weeks     Resume previous diet    Complete by:  As directed            Discharge Diagnosis:  PVD with claudication  Secondary Diagnosis: Patient Active Problem List   Diagnosis Date Noted  . PAD (peripheral artery disease) 10/12/2013  . Peripheral vascular disease, unspecified 09/29/2013  . Atherosclerosis of native arteries of the extremities with intermittent claudication  07/14/2013  . Preop cardiovascular exam 03/02/2013  . Subclavian arterial stenosis, lt, with PTA/STENT 07/31/11 08/01/2011  . S/P angioplasty with stent, lt. subclavian 07/31/11 08/01/2011  . Cardiomyopathy 07/28/2011  . Biventricular implantable cardioverter-defibrillator in situ 07/28/2011  . PVD, known severe, (previuosly asymptomatic) LSCA disease, now with "high grade" RICA disease. 07/28/2011  . Bronchitis, recent flare 07/28/2011  . Syncope,possible related to  subclavian steal syndrome 07/27/2011  . Vertigo 07/27/2011  . Hypokalemia 07/27/2011  . SPINAL STENOSIS 01/28/2010  . CONSTIPATION 01/14/2010  . BACK PAIN, LUMBAR 01/01/2010  . INSOMNIA 01/23/2009  . ALLERGIC RHINITIS 11/10/2008  . CAD, 75% LAD, 75% RCA 2009, low risk Myoview 2/12 06/21/2008  . HIP PAIN, BILATERAL 06/21/2008  . FIBROMYALGIA 11/26/2007  . HYPERLIPIDEMIA, Hx of Niacin and statin intol 07/28/2007  . ACUTE SINUSITIS, UNSPECIFIED 07/28/2007  . WEIGHT GAIN 06/04/2007  . DYSPNEA 06/04/2007  . COPD 03/02/2007  . DEPRESSION 02/25/2007  . HTN, Nl renal arteries 3/12 02/25/2007  . GERD 02/25/2007  . COLONIC POLYPS, HX OF 02/25/2007   Past Medical History  Diagnosis Date  . COPD (chronic obstructive pulmonary disease)   . Hx of colonic polyps   . Insomnia   . Fibromyalgia   . Hyperlipemia     takes Crestor daily  . CAD (coronary artery disease)     Currently angina free, no evidence of reversible ischemia  . Vertigo     takes Meclizine prn  . Subclavian arterial stenosis, lt, with PTA/STENT 07/31/11 08/01/2011  . S/P angioplasty with stent, lt. subclavian 07/31/11 08/01/2011  . Hypertension     takes Amlodipine and Metoprolol daily  . Bronchitis   . Arthritis   . Chronic back pain   . GERD (gastroesophageal reflux disease)     was on meds but was taken off;now watches what she eats  . Chronic constipation   . Hemorrhoids   . Urinary incontinence   . Early cataracts, bilateral   . Anxiety   .  Depression     takes Cymbalta daily  . Congestive heart failure (CHF)     New York Heart Association functional class 2, diastolic dysfunction  . PAD (peripheral artery disease)     Carotid, subclavian, and lower extremity beds, currently not symptomatic  . Syncope 07/28/2011    EF - 50-55, moderate concentric hypertrophy in left ventricle  . LBBB (left bundle branch block)     Stress test 09/03/2010, EF 55  . Nephrolithiasis   . Automatic implantable cardioverter-defibrillator in situ   . Pacemaker   . Shortness of breath     when over exerting self per pt.       Medication List         ALPRAZolam 1 MG tablet  Commonly known as:  XANAX  Take 0.5 tablets (0.5 mg total) by mouth at bedtime as needed for anxiety.     aspirin 325 MG tablet  Take 81.25 mg by mouth daily. Take 1/4 tablet daily.     DULoxetine 60 MG capsule  Commonly known as:  CYMBALTA  Take 60 mg by mouth 2 (two) times daily.     furosemide 80 MG tablet  Commonly known as:  LASIX  Take 80 mg by mouth 2 (two) times daily.     HYDROcodone-acetaminophen 10-325 MG per tablet  Commonly known as:  NORCO  Take 1 tablet by mouth every 6 (six) hours as needed for moderate pain.     potassium chloride SA 20 MEQ tablet  Commonly known as:  K-DUR,KLOR-CON  Take 20 mEq by mouth 2 (two) times daily.        Vicodin #30 No Refill  Disposition: home  Patient's condition: is Good  Follow up: 1. Dr. Oneida Alar in 2 weeks   Leontine Locket, PA-C [facility] [phone] 11/13/2013  8:15 AM  - For VQI Registry use --- Instructions: Press F2 to  tab through selections.  Delete question if not applicable.   Post-op:  Wound infection: No  Graft infection: No  Transfusion: No  If yes, n/a units given New Arrhythmia: No Ipsilateral amputation: No, [ ]  Minor, [ ]  BKA, [ ]  AKA Discharge patency: [x ] Primary, [ ]  Primary assisted, [ ]  Secondary, [ ]  Occluded Patency judged by: [ ]  Dopper only, [ ]   Palpable graft pulse, [ x] Palpable distal pulse, [ ]  ABI inc. > 0.15, [ ]  Duplex Discharge ABI: R 0.83, L 0.33 Discharge TBI: R , L  D/C Ambulatory Status: Ambulatory  Complications: MI: No, [ ]  Troponin only, [ ]  EKG or Clinical CHF: No Resp failure:No, [ ]  Pneumonia, [ ]  Ventilator Chg in renal function: No, [ ]  Inc. Cr > 0.5, [ ]  Temp. Dialysis, [ ]  Permanent dialysis Stroke: No, [ ]  Minor, [ ]  Major Return to OR: No  Reason for return to OR: [ ]  Bleeding, [ ]  Infection, [ ]  Thrombosis, [ ]  Revision  Discharge medications: Statin use:  no ASA use:  yes Plavix use:  no Beta blocker use: no Coumadin use: no

## 2013-11-17 ENCOUNTER — Telehealth: Payer: Self-pay | Admitting: Family Medicine

## 2013-11-17 NOTE — Telephone Encounter (Signed)
Pt has a sinus inf requesting another appt or abx call into [facility]. Can I use [facility] slot?

## 2013-11-18 MED ORDER — DOXYCYCLINE HYCLATE 100 MG PO TABS: 100.0000 mg | ORAL_TABLET | Freq: Two times a day (BID) | ORAL | Status: DC

## 2013-11-18 NOTE — Telephone Encounter (Signed)
I sent script e-scribe and left a voice message for pt. 

## 2013-11-18 NOTE — Telephone Encounter (Signed)
Call in Doxycycline 100 mg bid for 10 days  

## 2013-11-21 ENCOUNTER — Encounter: Payer: Self-pay | Admitting: Vascular Surgery

## 2013-11-30 ENCOUNTER — Emergency Department (HOSPITAL_COMMUNITY)
Admission: EM | Admit: 2013-11-30 | Discharge: 2013-11-30 | Disposition: A | Discharge status: Home / Self Care | Payer: Medicare Other | Attending: Emergency Medicine | Admitting: Emergency Medicine

## 2013-11-30 ENCOUNTER — Emergency Department (HOSPITAL_COMMUNITY): Payer: Medicare Other

## 2013-11-30 ENCOUNTER — Ambulatory Visit (INDEPENDENT_AMBULATORY_CARE_PROVIDER_SITE_OTHER): Payer: Medicare Other | Admitting: Family Medicine

## 2013-11-30 ENCOUNTER — Encounter (HOSPITAL_COMMUNITY): Payer: Self-pay | Admitting: Emergency Medicine

## 2013-11-30 VITALS — BP 155/83 | HR 97 | Temp 97.9°F | Resp 14

## 2013-11-30 DIAGNOSIS — I6529 Occlusion and stenosis of unspecified carotid artery: Secondary | ICD-10-CM | POA: Insufficient documentation

## 2013-11-30 DIAGNOSIS — R42 Dizziness and giddiness: Secondary | ICD-10-CM

## 2013-11-30 DIAGNOSIS — Z8639 Personal history of other endocrine, nutritional and metabolic disease: Secondary | ICD-10-CM | POA: Insufficient documentation

## 2013-11-30 DIAGNOSIS — Z7982 Long term (current) use of aspirin: Secondary | ICD-10-CM | POA: Insufficient documentation

## 2013-11-30 DIAGNOSIS — G8929 Other chronic pain: Secondary | ICD-10-CM | POA: Insufficient documentation

## 2013-11-30 DIAGNOSIS — I10 Essential (primary) hypertension: Secondary | ICD-10-CM | POA: Insufficient documentation

## 2013-11-30 DIAGNOSIS — F329 Major depressive disorder, single episode, unspecified: Secondary | ICD-10-CM | POA: Insufficient documentation

## 2013-11-30 DIAGNOSIS — J449 Chronic obstructive pulmonary disease, unspecified: Secondary | ICD-10-CM | POA: Insufficient documentation

## 2013-11-30 DIAGNOSIS — F17200 Nicotine dependence, unspecified, uncomplicated: Secondary | ICD-10-CM | POA: Insufficient documentation

## 2013-11-30 DIAGNOSIS — Z880 Allergy status to penicillin: Secondary | ICD-10-CM | POA: Insufficient documentation

## 2013-11-30 DIAGNOSIS — I509 Heart failure, unspecified: Secondary | ICD-10-CM | POA: Insufficient documentation

## 2013-11-30 DIAGNOSIS — IMO0001 Reserved for inherently not codable concepts without codable children: Secondary | ICD-10-CM | POA: Insufficient documentation

## 2013-11-30 DIAGNOSIS — Z87448 Personal history of other diseases of urinary system: Secondary | ICD-10-CM | POA: Insufficient documentation

## 2013-11-30 DIAGNOSIS — I708 Atherosclerosis of other arteries: Secondary | ICD-10-CM | POA: Insufficient documentation

## 2013-11-30 DIAGNOSIS — Z95810 Presence of automatic (implantable) cardiac defibrillator: Secondary | ICD-10-CM | POA: Insufficient documentation

## 2013-11-30 DIAGNOSIS — F3289 Other specified depressive episodes: Secondary | ICD-10-CM | POA: Insufficient documentation

## 2013-11-30 DIAGNOSIS — M549 Dorsalgia, unspecified: Secondary | ICD-10-CM | POA: Insufficient documentation

## 2013-11-30 DIAGNOSIS — I2510 Atherosclerotic heart disease of native coronary artery without angina pectoris: Secondary | ICD-10-CM | POA: Insufficient documentation

## 2013-11-30 DIAGNOSIS — I739 Peripheral vascular disease, unspecified: Secondary | ICD-10-CM | POA: Insufficient documentation

## 2013-11-30 DIAGNOSIS — F411 Generalized anxiety disorder: Secondary | ICD-10-CM | POA: Insufficient documentation

## 2013-11-30 DIAGNOSIS — Z950 Presence of cardiac pacemaker: Secondary | ICD-10-CM | POA: Insufficient documentation

## 2013-11-30 DIAGNOSIS — Z79899 Other long term (current) drug therapy: Secondary | ICD-10-CM | POA: Insufficient documentation

## 2013-11-30 DIAGNOSIS — Z7902 Long term (current) use of antithrombotics/antiplatelets: Secondary | ICD-10-CM | POA: Insufficient documentation

## 2013-11-30 DIAGNOSIS — H579 Unspecified disorder of eye and adnexa: Secondary | ICD-10-CM | POA: Insufficient documentation

## 2013-11-30 DIAGNOSIS — Z9889 Other specified postprocedural states: Secondary | ICD-10-CM | POA: Insufficient documentation

## 2013-11-30 DIAGNOSIS — J4489 Other specified chronic obstructive pulmonary disease: Secondary | ICD-10-CM | POA: Insufficient documentation

## 2013-11-30 DIAGNOSIS — Z792 Long term (current) use of antibiotics: Secondary | ICD-10-CM | POA: Insufficient documentation

## 2013-11-30 DIAGNOSIS — Z8719 Personal history of other diseases of the digestive system: Secondary | ICD-10-CM | POA: Insufficient documentation

## 2013-11-30 DIAGNOSIS — Z862 Personal history of diseases of the blood and blood-forming organs and certain disorders involving the immune mechanism: Secondary | ICD-10-CM | POA: Insufficient documentation

## 2013-11-30 DIAGNOSIS — I771 Stricture of artery: Secondary | ICD-10-CM | POA: Insufficient documentation

## 2013-11-30 LAB — I-STAT CHEM 8, ED
BUN: 12 mg/dL (ref 6–23)
CALCIUM ION: 1.18 mmol/L (ref 1.13–1.30)
CHLORIDE: 99 meq/L (ref 96–112)
Creatinine, Ser: 1 mg/dL (ref 0.50–1.10)
GLUCOSE: 114 mg/dL — AB (ref 70–99)
HCT: 33 % — ABNORMAL LOW (ref 36.0–46.0)
Hemoglobin: 11.2 g/dL — ABNORMAL LOW (ref 12.0–15.0)
POTASSIUM: 2.9 meq/L — AB (ref 3.7–5.3)
Sodium: 144 mEq/L (ref 137–147)
TCO2: 29 mmol/L (ref 0–100)

## 2013-11-30 MED ORDER — IOHEXOL 350 MG/ML SOLN
50.0000 mL | Freq: Once | INTRAVENOUS | Status: AC | PRN
Start: 2013-11-30 — End: 2013-11-30
  Administered 2013-11-30: 50 mL via INTRAVENOUS

## 2013-11-30 MED ORDER — POTASSIUM CHLORIDE CRYS ER 20 MEQ PO TBCR
40.0000 meq | EXTENDED_RELEASE_TABLET | Freq: Once | ORAL | Status: AC
  Administered 2013-11-30: 40 meq via ORAL
  Filled 2013-11-30: qty 2

## 2013-11-30 NOTE — ED Notes (Signed)
Pt refused vitals at discharge. 

## 2013-11-30 NOTE — ED Notes (Signed)
Pt very angry and stating if she is not out of here by 2300 she is going to sue the hospital. Pt demanding to have the IV removed. ED PA made aware of situation and pt made aware of the risks and benefits of having her IV removed. Pt A&Ox4 and reports she still wants the IV out. IV removed and pt getting dressed.

## 2013-11-30 NOTE — ED Provider Notes (Addendum)
CSN: [id number]     Arrival date & time 11/30/13  1821 History   First MD Initiated Contact with Patient 11/30/13 2133     Chief Complaint  Patient presents with  . Dizziness   HPI  History provided by patient and family. Patient is a 78 year old female with history of hypertension, hyperlipidemia, COPD, CAD, subclavian artery stenosis presents from PCP office with worsening spells of dizziness. Patient has had worsening dizziness mostly in the morning when she gets up. She describes feeling lightheaded and off balance. She does have known history of subclavian stenosis and was told by her doctor when she was evaluated today in his office to come to the emergency room to have a scan of her head and neck to make sure the blockages were not any worse. She denies having any headache. No confusion, no speech change. No weakness or paralysis. No recent illnesses.    Past Medical History  Diagnosis Date  . COPD (chronic obstructive pulmonary disease)   . Hx of colonic polyps   . Insomnia   . Fibromyalgia   . Hyperlipemia     takes Crestor daily  . CAD (coronary artery disease)     Currently angina free, no evidence of reversible ischemia  . Vertigo     takes Meclizine prn  . Subclavian arterial stenosis, lt, with PTA/STENT 07/31/11 08/01/2011  . S/P angioplasty with stent, lt. subclavian 07/31/11 08/01/2011  . Hypertension     takes Amlodipine and Metoprolol daily  . Bronchitis   . Arthritis   . Chronic back pain   . GERD (gastroesophageal reflux disease)     was on meds but was taken off;now watches what she eats  . Chronic constipation   . Hemorrhoids   . Urinary incontinence   . Early cataracts, bilateral   . Anxiety   . Depression     takes Cymbalta daily  . Congestive heart failure (CHF)     New York Heart Association functional class 2, diastolic dysfunction  . PAD (peripheral artery disease)     Carotid, subclavian, and lower extremity beds, currently not symptomatic  .  Syncope 07/28/2011    EF - 50-55, moderate concentric hypertrophy in left ventricle  . LBBB (left bundle branch block)     Stress test 09/03/2010, EF 55  . Nephrolithiasis   . Automatic implantable cardioverter-defibrillator in situ   . Pacemaker   . Shortness of breath     when over exerting self per pt.   Past Surgical History  Procedure Laterality Date  . Appendectomy    . Tonsillectomy    . Tubal ligation    . Cardiac defibrillator placement  05/2008    By Dr Blanch Media, [employer] CANNOT HAVE MRI's  . Abdominal hysterectomy    . Back surgery  2012  . Orif elbow fracture  08/16/2011    Procedure: OPEN REDUCTION INTERNAL FIXATION (ORIF) ELBOW/OLECRANON FRACTURE;  Surgeon: Schuyler Amor, MD;  Location: [facility];  Service: Orthopedics;  Laterality: Left;  . Cardiac catheterization  12/01/2007    By Dr. Melvern Banker, left heart cath,   . Coronary angioplasty    . Subclavian stent placement Left 07/31/2011    7x18 Genesis, balloon, with reduction of 90% ostial left subclavian artery stenosis to 0% with residual excellent flow  . Femoral-popliteal bypass graft Right 10/12/2013    Procedure:   Femoral-Peroneal trunk  bypass with nonreversed greater saphenous vein graft;  Surgeon: Elam Dutch, MD;  Location: [facility];  Service:  Vascular;  Laterality: Right;  . Intraoperative arteriogram Right 10/12/2013    Procedure: INTRA OPERATIVE ARTERIOGRAM;  Surgeon: Elam Dutch, MD;  Location: [facility] OR;  Service: Vascular;  Laterality: Right;   Family History  Problem Relation Age of Onset  . Hyperlipidemia    . Anesthesia problems Neg Hx   . Hypotension Neg Hx   . Malignant hyperthermia Neg Hx   . Pseudochol deficiency Neg Hx   . Colon cancer Maternal Grandmother   . Cancer Sister     ovarian  . Diabetes Sister   . Heart disease Sister   . CAD Father   . Heart disease Father   . Hyperlipidemia Father   . Heart disease Mother   . Deep vein thrombosis Son    History  Substance Use Topics  .  Smoking status: Current Every Day Smoker -- 0.50 packs/day for 60 years    Types: Cigarettes  . Smokeless tobacco: Never Used     Comment: 2 cigarettes per day  . Alcohol Use: No   OB History   Grav Para Term Preterm Abortions TAB SAB Ect Mult Living                 Review of Systems  Neurological: Positive for dizziness.  All other systems reviewed and are negative.     Allergies  Codeine; Darvon; Meloxicam; Potassium-containing compounds; Propoxyphene n-acetaminophen; Rofecoxib; Azithromycin; Erythromycin; Penicillins; and Sulfa antibiotics  Home Medications   Prior to Admission medications   Medication Sig Start Date End Date Taking? Authorizing Provider  ALPRAZolam Duanne Moron) 1 MG tablet Take 0.5 tablets (0.5 mg total) by mouth at bedtime as needed for anxiety. 10/16/13  Yes Elam Dutch, MD  aspirin 325 MG tablet Take 81.25 mg by mouth daily. Take 1/4 tablet daily.   Yes Historical Provider, MD  clopidogrel (PLAVIX) 75 MG tablet Take 1 tablet (75 mg total) by mouth daily. 10/24/13  Yes Laurey Morale, MD  doxycycline (VIBRA-TABS) 100 MG tablet Take 1 tablet (100 mg total) by mouth 2 (two) times daily. 11/18/13  Yes Laurey Morale, MD  DULoxetine (CYMBALTA) 60 MG capsule Take 60 mg by mouth 2 (two) times daily.   Yes Historical Provider, MD  furosemide (LASIX) 80 MG tablet Take 80 mg by mouth 2 (two) times daily. 04/20/13  Yes Laurey Morale, MD  HYDROcodone-acetaminophen (NORCO) 10-325 MG per tablet Take 1 tablet by mouth every 6 (six) hours as needed for moderate pain. 10/14/13  Yes Samantha J Rhyne, PA-C  lisinopril (PRINIVIL,ZESTRIL) 20 MG tablet Take 1 tablet (20 mg total) by mouth daily. 10/18/13  Yes Elam Dutch, MD  metoprolol tartrate (LOPRESSOR) 25 MG tablet Take 1 tablet (25 mg total) by mouth daily. 10/18/13  Yes Elam Dutch, MD  potassium chloride SA (K-DUR,KLOR-CON) 20 MEQ tablet Take 20 mEq by mouth 2 (two) times daily.   Yes Historical Provider, MD   BP 157/55   Pulse 87  Temp(Src) 98.7 F (37.1 C) (Oral)  Resp 23  SpO2 96% Physical Exam  Nursing note and vitals reviewed. Constitutional: She is oriented to person, place, and time. She appears well-developed and well-nourished. No distress.  HENT:  Head: Normocephalic and atraumatic.  Eyes: Conjunctivae and EOM are normal. Pupils are equal, round, and reactive to light.  Neck: Normal range of motion. Neck supple.  No meningeal signs  Cardiovascular: Normal rate and regular rhythm.   Pulmonary/Chest: Effort normal and breath sounds normal. No respiratory distress.  Abdominal:  There is no rigidity, no CVA tenderness and no tenderness at McBurney's point.  Neurological: She is alert and oriented to person, place, and time. She has normal strength. No cranial nerve deficit or sensory deficit.  Skin: Skin is warm.  Psychiatric: She has a normal mood and affect. Her behavior is normal.    ED Course  Procedures   COORDINATION OF CARE:  Nursing notes reviewed. Vital signs reviewed. Initial pt interview and examination performed.   Filed Vitals:   11/30/13 2015 11/30/13 2022 11/30/13 2045 11/30/13 2100  BP: 163/66 163/66 135/63 157/55  Pulse: 88 87 85 87  Temp:      TempSrc:      Resp: 18 18 24 23   SpO2: 91% 93% 96% 96%    9:46 PM-patient seen and evaluated. Patient appears well she does not have any focal neuro deficits. She does express her dissatisfaction with having to wait a long time in the emergency department. She has a hard he expressed to the nurse that she wished to leave multiple times. At this point I have discussed the importance of waiting for test results so that she can make a good decision about whether she should leave or not. She does agree to wait for the radiology report.   Patient was also seen and evaluated by attending physician. Agrees with plan and workup. Patient is. Having acute emergency.  CT scan results show grossly patent left subclavian stent. There is left  internal carotid artery stenosis at most 70%. No other signs of acute CVA or dissections. At this point patient may be discharged home and followup outpatient with her doctors.   Treatment plan initiated: Medications  potassium chloride SA (K-DUR,KLOR-CON) CR tablet 40 mEq (40 mEq Oral Given 11/30/13 2054)  iohexol (OMNIPAQUE) 350 MG/ML injection 50 mL (50 mLs Intravenous Contrast Given 11/30/13 2124)    Results for orders placed during the hospital encounter of 11/30/13  I-STAT CHEM 8, ED      Result Value Ref Range   Sodium 144  137 - 147 mEq/L   Potassium 2.9 (*) 3.7 - 5.3 mEq/L   Chloride 99  96 - 112 mEq/L   BUN 12  6 - 23 mg/dL   Creatinine, Ser 1.00  0.50 - 1.10 mg/dL   Glucose, Bld 114 (*) 70 - 99 mg/dL   Calcium, Ion 1.18  1.13 - 1.30 mmol/L   TCO2 29  0 - 100 mmol/L   Hemoglobin 11.2 (*) 12.0 - 15.0 g/dL   HCT 33.0 (*) 36.0 - 46.0 %   Comment NOTIFIED PHYSICIAN        Imaging Review Ct Angio Head W/cm &/or Wo Cm  11/30/2013   CLINICAL DATA:  Dizziness  EXAM: CT ANGIOGRAPHY HEAD AND NECK  TECHNIQUE: Multidetector CT imaging of the head and neck was performed using the standard protocol during bolus administration of intravenous contrast. Multiplanar CT image reconstructions and MIPs were obtained to evaluate the vascular anatomy. Carotid stenosis measurements (when applicable) are obtained utilizing NASCET criteria, using the distal internal carotid diameter as the denominator.  CONTRAST:  34mL OMNIPAQUE IOHEXOL 350 MG/ML SOLN  COMPARISON:  Prior CT from 07/28/2011  FINDINGS: CTA HEAD FINDINGS  Precontrast images of the brain demonstrate no acute intracranial hemorrhage or large vessel territory infarct. Mild generalized cerebral atrophy with moderate chronic microvascular ischemic disease is present.  No mass lesion or midline shift. No hydrocephalus or extra-axial fluid collection.  Calvarium is intact. Scalp soft tissues within normal limits. Orbits  are normal. Paranasal  sinuses and mastoid air cells are clear.  CTA :  The petrous segments of the internal carotid arteries are well opacified bilaterally. Atherosclerotic calcifications present within the carotid siphons bilaterally multi focal atherosclerotic irregularity seen without definite high-grade flow-limiting stenosis. The supra clinoid segments are within normal limits. A1 segments are well opacified bilaterally. Anterior communicating artery is normal. Anterior cerebral arteries are well opacified.  Middle cerebral arteries are well opacified without proximal branch occlusion or high-grade flow-limiting stenosis. Distal MCA branches are well opacified.  Right vertebral artery is dominant. Posterior inferior cerebellar arteries are normal. Basilar artery is within normal limits. Posterior cerebral arteries are well opacified bilaterally. Superior cerebellar arteries are normal.  No intracranial aneurysm, proximal branch occlusion, or high-grade flow-limiting stenosis identified within the intracranial circulation.  Review of the MIP images confirms the above findings.  CTA NECK FINDINGS  Prominent atherosclerotic calcifications present within the aortic arch. He refer her arch itself is of normal caliber with normal 3 vessel morphology. Endovascular stent present within the proximal left subclavian artery. The stent is grossly patent without definite high-grade flow-limiting stenosis. The subclavian arteries are opacified distally.  Scattered calcified atheromatous disease seen throughout the common carotid arteries without evidence of high-grade flow-limiting stenosis or dissection. Heavy atherosclerotic calcifications seen about the carotid bifurcations bilaterally.  On the left, prominent calcified and noncalcified atheromatous disease seen at the origin of the left internal carotid artery with resultant irregular stenosis of approximately 70% by NASCET criteria. This measures approximately 5 mm in length. Distally, a  second stenosis of approximately 50% seen within the left internal carotid artery just prior to to its petrous segment.  On the right, there is calcified and noncalcified plaque at the origin of the right internal carotid artery with resultant approximately 30% stenosis by NASCET criteria. Distally, the right internal carotid artery is well opacified.  The external carotid arteries and their branches are within normal limits.  Both vertebral arteries arise from the subclavian arteries. The right vertebral artery is dominant. No evidence of vertebral artery dissection or occlusion.  Review of the MIP images confirms the above findings.  IMPRESSION: CTA HEAD:  1. No proximal branch occlusion, high-grade flow-limiting stenosis, or arterial dissection identified within the intracranial circulation. 2. Prominent multi focal atherosclerotic irregularity within the cavernous segments of the internal carotid arteries bilaterally.  CTA NECK:  1. Irregular short-segment stenosis of approximately 70% within the proximal left internal carotid artery just distal to the carotid bifurcation. This stenosis measures approximately 5 mm in length. 2. Second short-segment stenosis of approximately 50% within the left internal carotid artery just prior to entering the skullbase. 3. No evidence of dissection or other high-grade flow-limiting stenosis within the arterial vasculature of the neck. 4. Patent endovascular stent within the proximal left subclavian artery.   Electronically Signed   By: Jeannine Boga M.D.   On: 11/30/2013 23:20   Ct Angio Neck W/cm &/or Wo/cm  11/30/2013   CLINICAL DATA:  Dizziness  EXAM: CT ANGIOGRAPHY HEAD AND NECK  TECHNIQUE: Multidetector CT imaging of the head and neck was performed using the standard protocol during bolus administration of intravenous contrast. Multiplanar CT image reconstructions and MIPs were obtained to evaluate the vascular anatomy. Carotid stenosis measurements (when  applicable) are obtained utilizing NASCET criteria, using the distal internal carotid diameter as the denominator.  CONTRAST:  10mL OMNIPAQUE IOHEXOL 350 MG/ML SOLN  COMPARISON:  Prior CT from 07/28/2011  FINDINGS: CTA HEAD FINDINGS  Precontrast images  of the brain demonstrate no acute intracranial hemorrhage or large vessel territory infarct. Mild generalized cerebral atrophy with moderate chronic microvascular ischemic disease is present.  No mass lesion or midline shift. No hydrocephalus or extra-axial fluid collection.  Calvarium is intact. Scalp soft tissues within normal limits. Orbits are normal. Paranasal sinuses and mastoid air cells are clear.  CTA :  The petrous segments of the internal carotid arteries are well opacified bilaterally. Atherosclerotic calcifications present within the carotid siphons bilaterally multi focal atherosclerotic irregularity seen without definite high-grade flow-limiting stenosis. The supra clinoid segments are within normal limits. A1 segments are well opacified bilaterally. Anterior communicating artery is normal. Anterior cerebral arteries are well opacified.  Middle cerebral arteries are well opacified without proximal branch occlusion or high-grade flow-limiting stenosis. Distal MCA branches are well opacified.  Right vertebral artery is dominant. Posterior inferior cerebellar arteries are normal. Basilar artery is within normal limits. Posterior cerebral arteries are well opacified bilaterally. Superior cerebellar arteries are normal.  No intracranial aneurysm, proximal branch occlusion, or high-grade flow-limiting stenosis identified within the intracranial circulation.  Review of the MIP images confirms the above findings.  CTA NECK FINDINGS  Prominent atherosclerotic calcifications present within the aortic arch. He refer her arch itself is of normal caliber with normal 3 vessel morphology. Endovascular stent present within the proximal left subclavian artery. The stent  is grossly patent without definite high-grade flow-limiting stenosis. The subclavian arteries are opacified distally.  Scattered calcified atheromatous disease seen throughout the common carotid arteries without evidence of high-grade flow-limiting stenosis or dissection. Heavy atherosclerotic calcifications seen about the carotid bifurcations bilaterally.  On the left, prominent calcified and noncalcified atheromatous disease seen at the origin of the left internal carotid artery with resultant irregular stenosis of approximately 70% by NASCET criteria. This measures approximately 5 mm in length. Distally, a second stenosis of approximately 50% seen within the left internal carotid artery just prior to to its petrous segment.  On the right, there is calcified and noncalcified plaque at the origin of the right internal carotid artery with resultant approximately 30% stenosis by NASCET criteria. Distally, the right internal carotid artery is well opacified.  The external carotid arteries and their branches are within normal limits.  Both vertebral arteries arise from the subclavian arteries. The right vertebral artery is dominant. No evidence of vertebral artery dissection or occlusion.  Review of the MIP images confirms the above findings.  IMPRESSION: CTA HEAD:  1. No proximal branch occlusion, high-grade flow-limiting stenosis, or arterial dissection identified within the intracranial circulation. 2. Prominent multi focal atherosclerotic irregularity within the cavernous segments of the internal carotid arteries bilaterally.  CTA NECK:  1. Irregular short-segment stenosis of approximately 70% within the proximal left internal carotid artery just distal to the carotid bifurcation. This stenosis measures approximately 5 mm in length. 2. Second short-segment stenosis of approximately 50% within the left internal carotid artery just prior to entering the skullbase. 3. No evidence of dissection or other high-grade  flow-limiting stenosis within the arterial vasculature of the neck. 4. Patent endovascular stent within the proximal left subclavian artery.   Electronically Signed   By: Jeannine Boga M.D.   On: 11/30/2013 23:20     MDM   Final diagnoses:  Dizziness  Subclavian arterial stenosis, lt, with PTA/STENT 11/30/13  Carotid artery stenosis         Martie Lee, PA-C 11/30/13 [place], PA-C 12/19/13 2008

## 2013-11-30 NOTE — ED Notes (Signed)
Pt refusing to be placed back on monitor. She is very angry stating she wants to leave and not wait for her CT results. Discussed the importance of staying until the results are back and she states she will stay but that she continues to refuse to be monitored. Pt informed of the risks and benefits of not being monitored. She continues to refuse. Pt A&Ox4 and able to female informed decisions. She agreed to have her v/s taken. ED PA made aware of situation.

## 2013-11-30 NOTE — ED Provider Notes (Signed)
Pt seen and examined.  Asymptomatic and demanding to be discharged.  CTA shows Carotic stenosis, and patent SCA stent.  Normal neuro exam.  Appropriate for discharge.  Tanna Furry, MD 11/30/13 [phone]

## 2013-11-30 NOTE — ED Notes (Signed)
PA Dammen given a copy of chem 8 results

## 2013-11-30 NOTE — ED Notes (Addendum)
Pt. Reports having dizziness began 2 weeks ago.  Pt. Denies any pain.  Pt. Reports feeling weak all over.  Pt. Is not sleeping at night..  Dr. Sarajane Jews sent pt to [place] for a work-up.  Speech is clear.   Pt. Has a blockage in the back of her neck, and Dr. Sarajane Jews would like it checked.   Pt. Also has sob unable to walk 30 feet without being sob.  Pt. Was at Dr. Murrell Redden office today and had test done.  Everything was negative.  Dr. Sarajane Jews sent pt to [place] for a CT scan.

## 2013-11-30 NOTE — Discharge Instructions (Signed)
You were seen and evaluated for you increased dizziness symptoms. Your CAT scan did not show any blockage in the stent in your neck area. 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 Please continue to followup with your primary care provider in your vascular surgeon for continued evaluation and treatment. Return at anytime for changing or worsening symptoms.    Carotid Artery Disease  The carotid arteries are the two main arteries on either side of the neck that supply blood to the brain. Carotid artery disease, also called carotid artery stenosis, is the narrowing or blockage of one or both carotid arteries. Carotid artery disease increases your risk for a stroke or a transient ischemic attack (TIA). A TIA is an episode in which a waxy, fatty substance that accumulates within the artery (plaque) blocks blood flow to the brain. A TIA is considered a "warning stroke."  CAUSES   Buildup of plaque inside the carotid arteries (atherosclerosis) (common).  A weakened outpouching in an artery (aneurysm).  Inflammation of the carotid artery (arteritis).  A fibrous growth within the carotid artery (fibromuscular dysplasia).  Tissue death within the carotid artery due to radiation treatment (post-radiation necrosis).  Decreased blood flow due to spasms of the carotid artery (vasospasm).  Separation of the walls of the carotid artery (carotid dissection). RISK FACTORS  High cholesterol (dyslipidemia).   High blood pressure (hypertension).   Smoking.   Obesity.   Diabetes.   Family history of cardiovascular disease.   Inactivity or lack of regular exercise.   Being female. Men have an increased risk of developing atherosclerosis earlier in life than women.  SYMPTOMS  Carotid artery disease does not cause symptoms. DIAGNOSIS Diagnosis of carotid artery disease may include:   A physical exam. Your health care provider may hear an abnormal sound (bruit) when  listening to the carotid arteries.   Specific tests that look at the blood flow in the carotid arteries. These tests include:   Carotid artery ultrasonography.   Carotid or cerebral angiography.   Computerized tomographic angiography (CTA).   Magnetic resonance angiography (MRA).  TREATMENT  Treatment of carotid artery disease can include a combination of treatments. Treatment options include:  Surgery. You may have:   A carotid endarterectomy. This is a surgery to remove the blockages in the carotid arteries.   A carotid angioplasty with stenting. This is a nonsurgical interventional procedure. A wire mesh (stent) is used to widen the blocked carotid arteries.   Medicines to control blood pressure, cholesterol, and reduce blood clotting (antiplatelet therapy).   Adjusting your diet.   Lifestyle changes such as:   Quitting smoking.   Exercising as tolerated or as directed by your health care provider.   Controlling and maintaining a good blood pressure.   Keeping cholesterol levels under control.  HOME CARE INSTRUCTIONS   Take medicines as directed by your health care provider. Make sure you understand all your medicine instructions. Do not stop your medicines without talking to your health care provider.   Follow your health care provider's diet instructions. It is important to eat a healthy diet that is low in saturated fats and includes plenty of fresh fruits, vegetables, and lean meats. High-fat, high-sodium foods as well as foods that are fried, overly processed, or have poor nutritional value should be avoided.  Maintain a healthy weight.   Stay physically active. It is recommended that you get at least 30 minutes of activity every day.   Do not smoke.  Limit alcohol use to:   No more than 2 drinks per day for men.   No more than 1 drink per day for nonpregnant women.   Do not use illegal drugs.   Keep all follow-up appointments as  directed by your health care provider.  SEEK IMMEDIATE MEDICAL CARE IF:  You develop TIA or stroke symptoms. These include:   Sudden weakness or numbness on one side of the body, such as in the face, arm, or leg.   Sudden confusion.   Trouble speaking (aphasia) or understanding.   Sudden trouble seeing out of one or both eyes.   Sudden trouble walking.   Dizziness or feeling like you might faint.   Loss of balance or coordination.   Sudden severe headache with no known cause.   Sudden trouble swallowing (dysphagia).  If you have any of these symptoms, call your local emergency services (911 in [place] Do not drive yourself to the clinic or hospital. This is a medical emergency.  Document Released: 08/25/2011 Document Revised: 06/07/2013 Document Reviewed: 12/01/2012 [facility] Patient Information 2015 [place], [place]. This information is not intended to replace advice given to you by your health care provider. Make sure you discuss any questions you have with your health care provider.

## 2013-12-01 ENCOUNTER — Telehealth: Payer: Self-pay | Admitting: Family Medicine

## 2013-12-01 ENCOUNTER — Encounter: Payer: Self-pay | Admitting: Family Medicine

## 2013-12-01 DIAGNOSIS — G4730 Sleep apnea, unspecified: Secondary | ICD-10-CM

## 2013-12-01 MED ORDER — MECLIZINE HCL 25 MG PO TABS: 25.0000 mg | ORAL_TABLET | ORAL | Status: DC | PRN

## 2013-12-01 NOTE — Progress Notes (Signed)
   Subjective:    Patient ID: Sydney Phillips, female    DOB: 04-27-1936, 78 y.o.   MRN: [id number]  HPI Here with her son for the sudden onset of dizziness when she first woke up this am. Whenever she moves her head the slightest bit she feels like everything starts spinning around her. She feels better when sitting still. No HA or SOB or chest pain. No blurred vision. She has known arterial vascular disease in multiple areas.   Review of Systems  Constitutional: Negative.   HENT: Negative.   Eyes: Negative.   Respiratory: Negative.   Cardiovascular: Negative.   Neurological: Positive for dizziness. Negative for tremors, seizures, syncope, facial asymmetry, speech difficulty, weakness, light-headedness, numbness and headaches.       Objective:   Physical Exam  Constitutional: She is oriented to person, place, and time.  In a wheelchair, very frightened and shaky  HENT:  Head: Normocephalic and atraumatic.  Right Ear: External ear normal.  Left Ear: External ear normal.  Nose: Nose normal.  Mouth/Throat: Oropharynx is clear and moist.  Eyes: Conjunctivae and EOM are normal. Pupils are equal, round, and reactive to light.  Neck: No thyromegaly present.  Cardiovascular: Normal rate, regular rhythm, normal heart sounds and intact distal pulses.   Pulmonary/Chest: Effort normal and breath sounds normal.  Lymphadenopathy:    She has no cervical adenopathy.  Neurological: She is alert and oriented to person, place, and time. No cranial nerve deficit.          Assessment & Plan:  Sudden onset of extreme dizziness, this could be vestibular dysfunction or it could represent a cerebellar stroke or vertibrobasilar disease. Her son will drive her directly to [facility] ER for further evaluation.

## 2013-12-01 NOTE — Telephone Encounter (Signed)
Pt wants a referral for Sleep Study. Pt states she hasn't slept in the past 3-4 months and wants to see a specialist.   Pt also states she received 2 potassium tablets yesterday in the ER and is still dizzy and she got a CT yesterday.

## 2013-12-01 NOTE — Telephone Encounter (Signed)
I spoke with pt and she is requesting something for the dizziness.

## 2013-12-01 NOTE — Telephone Encounter (Signed)
I sent script e-scribe and spoke with pt. 

## 2013-12-01 NOTE — Telephone Encounter (Signed)
Referral was done  

## 2013-12-01 NOTE — Telephone Encounter (Signed)
Call in Meclizine 25 mg q 4 hours prn dizziness, #60 with 5 rf 

## 2013-12-02 ENCOUNTER — Telehealth: Payer: Self-pay | Admitting: Family Medicine

## 2013-12-02 NOTE — Telephone Encounter (Signed)
[facility] Triage Call Report Triage Record Num: [id number] Operator: Burnett Sheng Patient Name: Sydney Phillips Call Date & Time: 11/30/2013 5:34:34PM Patient Phone: [phone] PCP: Ishmael Holter. Sarajane Jews Patient Gender: Female PCP Fax : [phone] Patient DOB: 1935-09-30 Practice Name: [facility] Reason for Call: Caller: Annina/Patient; PCP: Alysia Penna [facility]); CB#: [phone]; Patient reports she was evaluated in the office today, 11/30/13, and was instructed by Dr. Sharlene Motts to leave the office and proceed now to [facility] ED for evaluation. Patient states upon arrival, she was informed that they were not aware of her situation or that she had been sent by an MD. Patient states she refuses to sit in the ED waiting room to be seen. Patient reports she was sent from office for AM dizziness and a BP reading of 179/83 and pulse 103 while in office. Reports she is asymptomatic now. She states dizziness that occurred earlier this morning had resolved prior to her office visit at 3:15. Per Epic chart review--unable to determine patient instruction given at office visit today, 6/17. Instructed patient that if Dr. Sharlene Motts advised ED evaluation immediately following her appointment today then she should return to ED now. Advised I would call report to [facility] to notify them that she would be returning. Patient and son agreeable. Report called to [facility] ED Triage Nurse, Anda Kraft. Protocol(s) Used: Office Note Recommended Outcome per Protocol: Information Noted and Sent to Office Reason for Outcome: Caller information to office Care Advice: ~

## 2013-12-02 NOTE — Telephone Encounter (Signed)
Relevant patient education mailed to patient.  

## 2013-12-03 NOTE — ED Provider Notes (Signed)
Medical screening examination/treatment/procedure(s) were conducted as a shared visit with non-physician practitioner(s) and myself.  I personally evaluated the patient during the encounter.   EKG Interpretation None      Pt examined, and history reviewed.  Normal mental status.  No current synptoms.  Pt endorses history of these synptoms for months.    EKG Interpretation None        Tanna Furry, MD 12/03/13 [phone]

## 2013-12-12 ENCOUNTER — Other Ambulatory Visit: Payer: Self-pay | Admitting: Vascular Surgery

## 2013-12-12 ENCOUNTER — Other Ambulatory Visit: Payer: Self-pay | Admitting: Family Medicine

## 2013-12-12 ENCOUNTER — Telehealth: Payer: Self-pay | Admitting: Family Medicine

## 2013-12-12 DIAGNOSIS — Z029 Encounter for administrative examinations, unspecified: Secondary | ICD-10-CM

## 2013-12-12 NOTE — Telephone Encounter (Signed)
Pt is needing new rx for metoprolol tartrate (LOPRESSOR) 25 MG tablet send to [facility] on [address]. Pt also needs duloxetine (cymbalta) 60 mg. Pt would like to know if there is something else she can take for vertigo.

## 2013-12-13 MED ORDER — METOPROLOL TARTRATE 25 MG PO TABS: 25.0000 mg | ORAL_TABLET | Freq: Every day | ORAL | Status: DC

## 2013-12-13 NOTE — Telephone Encounter (Signed)
Re-fill request for DULoxetine (CYMBALTA) 60 MG capsule was left off.

## 2013-12-13 NOTE — Telephone Encounter (Signed)
I sent script e-scribe for Metoprolol to [facility].

## 2013-12-13 NOTE — Telephone Encounter (Signed)
Pt is out of bp med

## 2013-12-14 HISTORY — PX: COLONOSCOPY W/ POLYPECTOMY: SHX1380

## 2013-12-14 NOTE — Telephone Encounter (Signed)
This request has to be sent to Dr. Sarajane Jews for the Cymbalta and advice about the vertigo.

## 2013-12-15 ENCOUNTER — Telehealth: Payer: Self-pay | Admitting: Family Medicine

## 2013-12-15 MED ORDER — DULOXETINE HCL 60 MG PO CPEP: 60.0000 mg | ORAL_CAPSULE | Freq: Two times a day (BID) | ORAL | Status: DC

## 2013-12-15 MED ORDER — SCOPOLAMINE 1 MG/3DAYS TD PT72: 1.0000 | MEDICATED_PATCH | TRANSDERMAL | Status: DC

## 2013-12-15 NOTE — Telephone Encounter (Signed)
I spoke with pt and she will try to maybe pick up 1/2 of the script. I don't think that there is a substitute for this medication, however I will forward to Dr. Sarajane Jews for review.

## 2013-12-15 NOTE — Telephone Encounter (Signed)
Call in Cymbalta 60 mg bid, #60 with 11 rf. Also instead of Meclizine let her try Scopolamine transderm patches to wear behind the ear q 3 days. Call in #10 with 5 rf

## 2013-12-15 NOTE — Telephone Encounter (Signed)
I sent in both scripts e-scribe and left a voice message with the below information.

## 2013-12-15 NOTE — Telephone Encounter (Signed)
Pt states the scopolamine (TRANSDERM-SCOP) 1 MG/3DAYS rx is $95 . Is there another patch comparable to this? [facility]

## 2013-12-19 NOTE — Telephone Encounter (Signed)
There is no other substitute for this

## 2013-12-19 NOTE — Telephone Encounter (Signed)
I spoke with pt and gave below information.  

## 2013-12-20 NOTE — ED Provider Notes (Signed)
Medical screening examination/treatment/procedure(s) were conducted as a shared visit with non-physician practitioner(s) and myself.  I personally evaluated the patient during the encounter.   EKG Interpretation None      Patient seen and examined. History and findings reviewed with PA. Patient with long-standing history of vascular disease. No concerning findings noted on CT angiogram tonight. Patient quite desirous and anxious for discharge home. Encouraged followup with primary care and vascular.  Tanna Furry, MD 12/20/13 [phone]

## 2013-12-21 ENCOUNTER — Telehealth: Payer: Self-pay | Admitting: Family Medicine

## 2013-12-21 DIAGNOSIS — Z029 Encounter for administrative examinations, unspecified: Secondary | ICD-10-CM

## 2013-12-21 MED ORDER — HYDROCODONE-ACETAMINOPHEN 10-325 MG PO TABS
1.0000 | ORAL_TABLET | Freq: Four times a day (QID) | ORAL | Status: DC | PRN
Start: 2013-12-21 — End: 2013-12-21

## 2013-12-21 MED ORDER — LISINOPRIL 20 MG PO TABS: 20.0000 mg | ORAL_TABLET | Freq: Every day | ORAL | Status: DC

## 2013-12-21 MED ORDER — HYDROCODONE-ACETAMINOPHEN 10-325 MG PO TABS: 1.0000 | ORAL_TABLET | Freq: Four times a day (QID) | ORAL | Status: DC | PRN

## 2013-12-21 NOTE — Telephone Encounter (Signed)
Pt is out of her pain med and asked if you could have that ready today. Advised pt of out policy, and all I could do is ask for the rx today HYDROcodone-acetaminophen (NORCO) 10-325 MG per tablet Pt also needs lisinopril (PRINIVIL,ZESTRIL) 20 MG tablet Sent to [facility]

## 2013-12-21 NOTE — Telephone Encounter (Signed)
done

## 2013-12-21 NOTE — Telephone Encounter (Signed)
I have already sent Lisinopril e-scribe to [facility].

## 2013-12-21 NOTE — Telephone Encounter (Signed)
Script is ready for pick up and I spoke with pt.  

## 2013-12-28 ENCOUNTER — Other Ambulatory Visit: Payer: Self-pay | Admitting: Family Medicine

## 2014-01-03 ENCOUNTER — Institutional Professional Consult (permissible substitution): Payer: Medicare Other | Admitting: Pulmonary Disease

## 2014-01-05 ENCOUNTER — Telehealth: Payer: Self-pay | Admitting: Family Medicine

## 2014-01-05 NOTE — Telephone Encounter (Signed)
Error/gd °

## 2014-01-05 NOTE — Telephone Encounter (Signed)
Pt wants to come in on tomorrow at 3:30, pt states something is going on with her head and she is having trouble with her left eye. Ok to use SDA?

## 2014-01-06 ENCOUNTER — Ambulatory Visit (INDEPENDENT_AMBULATORY_CARE_PROVIDER_SITE_OTHER): Payer: Medicare Other | Admitting: Family Medicine

## 2014-01-06 ENCOUNTER — Encounter: Payer: Self-pay | Admitting: Family Medicine

## 2014-01-06 ENCOUNTER — Ambulatory Visit: Payer: Medicare Other | Admitting: Family Medicine

## 2014-01-06 VITALS — BP 134/83 | HR 83 | Temp 98.6°F | Ht 65.0 in | Wt 180.0 lb

## 2014-01-06 DIAGNOSIS — F3289 Other specified depressive episodes: Secondary | ICD-10-CM

## 2014-01-06 DIAGNOSIS — F329 Major depressive disorder, single episode, unspecified: Secondary | ICD-10-CM

## 2014-01-06 DIAGNOSIS — IMO0001 Reserved for inherently not codable concepts without codable children: Secondary | ICD-10-CM

## 2014-01-06 DIAGNOSIS — G4700 Insomnia, unspecified: Secondary | ICD-10-CM

## 2014-01-06 DIAGNOSIS — H539 Unspecified visual disturbance: Secondary | ICD-10-CM

## 2014-01-06 DIAGNOSIS — E876 Hypokalemia: Secondary | ICD-10-CM

## 2014-01-06 MED ORDER — ZOLPIDEM TARTRATE 10 MG PO TABS: 10.0000 mg | ORAL_TABLET | Freq: Every day | ORAL | Status: AC

## 2014-01-06 MED ORDER — POTASSIUM CHLORIDE CRYS ER 20 MEQ PO TBCR: 20.0000 meq | EXTENDED_RELEASE_TABLET | Freq: Two times a day (BID) | ORAL | Status: DC

## 2014-01-06 NOTE — Telephone Encounter (Signed)
Caller: [name]/Patient; Patient Name: Sydney Phillips; PCP: Alysia Penna [facility]); Best Callback Phone Number: [phone]:  States she spoke with [name] in the office this am regarding issues she is having with her eye and her appt. for today was canceled and they are going to refer her to an ophthalmologist;  She states she forgot to mention the shortness of breath she has been having for the last 4 months;  She states she walks from one room to another in the house and she is out of breath and has to stop and catch her breath;  It only takes 15-20 steps before she is out of breath, states this doesn't happen every time she is getting up though;  Denies chest pains; No cough or wheezing, no current breathing problems at time of call;  Afebrile; States she has been on Advair in the past;  Per Breathing Problems Protocol all emergent symptoms ruled out with exception of "Known chronic lung disease and symptoms are slowly worsening."  Home care advice given, emergent symptoms reviewed such as cp, severe breathing difficulty, confusion, coughing up anything with blood in it;  Patient verbalizes understanding;  Note will be placed in emr since patient states appt. for today was canceled, will let office advice of appt. time;  Disposition is to see within 2 weeks.   Office f/u:  Please contact patient regarding appt.  States she had one for today but it was canceled.

## 2014-01-06 NOTE — Telephone Encounter (Signed)
Please call and get more information from her

## 2014-01-06 NOTE — Telephone Encounter (Signed)
I spoke with pt and she is seeing specks and lines in the left eye, it comes and goes. She reports no other symptom. I asked if she was having any headaches and she said no, however she is still having the dizziness & weakness.

## 2014-01-06 NOTE — Telephone Encounter (Signed)
Can you call pt to schedule for today, okay per Dr. Sarajane Jews?

## 2014-01-06 NOTE — Progress Notes (Signed)
Pre visit review using our clinic review tool, if applicable. No additional management support is needed unless otherwise documented below in the visit note. 

## 2014-01-06 NOTE — Progress Notes (Signed)
   Subjective:    Patient ID: Sydney Phillips, female    DOB: 1936-02-14, 78 y.o.   MRN: [id number]  HPI Here to discuss muscle cramps, fatigue, insomnia, and one month of seeing dark streaks and spots in her left visual field. She is supposed to be taking potassium, since she is on high doses of lasix, but for some reason she stopped taking these. She has been having cramps in the arms and legs. She tried temazepam for sleep but it did not work. Then in March we gave her Zolpidem to try but apparently she never filled the rx. She still has trouble sleeping every night. No eye pain.    Review of Systems  Constitutional: Negative.   HENT: Negative.   Eyes: Positive for visual disturbance. Negative for photophobia, pain, discharge, redness and itching.  Respiratory: Negative.   Cardiovascular: Negative.   Musculoskeletal: Positive for myalgias.  Neurological: Positive for dizziness. Negative for tremors, seizures, syncope, facial asymmetry, speech difficulty, weakness, light-headedness, numbness and headaches.  Psychiatric/Behavioral: Positive for sleep disturbance and dysphoric mood. Negative for hallucinations, behavioral problems, confusion, decreased concentration and agitation. The patient is nervous/anxious. The patient is not hyperactive.        Objective:   Physical Exam  Constitutional: She is oriented to person, place, and time. She appears well-developed and well-nourished.  Walking with her cane   Cardiovascular: Normal rate, regular rhythm, normal heart sounds and intact distal pulses.   Pulmonary/Chest: Effort normal and breath sounds normal.  Neurological: She is alert and oriented to person, place, and time. No cranial nerve deficit.  Psychiatric: She has a normal mood and affect. Her behavior is normal. Thought content normal.          Assessment & Plan:  She needs to get back on potassium so we will refill 20 mEq bid. Try Zolpidem for sleep. She may have some  retinal detachments so we will refer to Ophthalmology.

## 2014-01-06 NOTE — Telephone Encounter (Signed)
Done

## 2014-01-09 ENCOUNTER — Ambulatory Visit (INDEPENDENT_AMBULATORY_CARE_PROVIDER_SITE_OTHER): Payer: Medicare Other | Admitting: *Deleted

## 2014-01-09 ENCOUNTER — Telehealth: Payer: Self-pay | Admitting: Cardiology

## 2014-01-09 DIAGNOSIS — I428 Other cardiomyopathies: Secondary | ICD-10-CM

## 2014-01-09 DIAGNOSIS — I429 Cardiomyopathy, unspecified: Secondary | ICD-10-CM

## 2014-01-09 DIAGNOSIS — I509 Heart failure, unspecified: Secondary | ICD-10-CM

## 2014-01-09 NOTE — Progress Notes (Signed)
Remote ICD transmission.   

## 2014-01-09 NOTE — Telephone Encounter (Signed)
LMOVM reminding pt to send remote transmission.   

## 2014-01-10 LAB — MDC_IDC_ENUM_SESS_TYPE_REMOTE
Battery Voltage: 3.09 V
Brady Statistic AS VP Percent: 99.45 %
Brady Statistic RA Percent Paced: 0.45 %
Date Time Interrogation Session: 20150727201833
HIGH POWER IMPEDANCE MEASURED VALUE: 19 Ohm
HighPow Impedance: 44 Ohm
HighPow Impedance: 49 Ohm
HighPow Impedance: 817 Ohm
Lead Channel Impedance Value: 817 Ohm
Lead Channel Pacing Threshold Amplitude: 0.75 V
Lead Channel Pacing Threshold Amplitude: 1.625 V
Lead Channel Pacing Threshold Pulse Width: 0.4 ms
Lead Channel Pacing Threshold Pulse Width: 0.4 ms
Lead Channel Sensing Intrinsic Amplitude: 25.875 mV
Lead Channel Sensing Intrinsic Amplitude: 25.875 mV
Lead Channel Sensing Intrinsic Amplitude: 3.375 mV
Lead Channel Sensing Intrinsic Amplitude: 3.375 mV
Lead Channel Setting Pacing Amplitude: 1.5 V
Lead Channel Setting Pacing Amplitude: 2.5 V
Lead Channel Setting Pacing Pulse Width: 0.4 ms
Lead Channel Setting Pacing Pulse Width: 1 ms
MDC IDC MSMT LEADCHNL LV IMPEDANCE VALUE: 323 Ohm
MDC IDC MSMT LEADCHNL LV IMPEDANCE VALUE: 437 Ohm
MDC IDC MSMT LEADCHNL LV IMPEDANCE VALUE: 627 Ohm
MDC IDC MSMT LEADCHNL LV PACING THRESHOLD PULSEWIDTH: 1 ms
MDC IDC MSMT LEADCHNL RA IMPEDANCE VALUE: 437 Ohm
MDC IDC MSMT LEADCHNL RA PACING THRESHOLD AMPLITUDE: 0.5 V
MDC IDC SET LEADCHNL RV PACING AMPLITUDE: 2 V
MDC IDC SET LEADCHNL RV SENSING SENSITIVITY: 0.3 mV
MDC IDC STAT BRADY AP VP PERCENT: 0.42 %
MDC IDC STAT BRADY AP VS PERCENT: 0.03 %
MDC IDC STAT BRADY AS VS PERCENT: 0.1 %
MDC IDC STAT BRADY RV PERCENT PACED: 99.87 %
Zone Setting Detection Interval: 320 ms
Zone Setting Detection Interval: 350 ms
Zone Setting Detection Interval: 360 ms
Zone Setting Detection Interval: 360 ms

## 2014-01-17 ENCOUNTER — Encounter: Payer: Self-pay | Admitting: Cardiology

## 2014-01-19 ENCOUNTER — Encounter (HOSPITAL_COMMUNITY): Payer: Self-pay | Admitting: Emergency Medicine

## 2014-01-19 ENCOUNTER — Emergency Department (HOSPITAL_COMMUNITY): Payer: Medicare Other

## 2014-01-19 ENCOUNTER — Telehealth: Payer: Self-pay | Admitting: Family Medicine

## 2014-01-19 ENCOUNTER — Inpatient Hospital Stay (HOSPITAL_COMMUNITY)
Admission: EM | Admit: 2014-01-19 | Discharge: 2014-01-23 | DRG: 281 | Disposition: A | Discharge status: Home Health | Payer: Medicare Other | Attending: Internal Medicine | Admitting: Internal Medicine

## 2014-01-19 DIAGNOSIS — F411 Generalized anxiety disorder: Secondary | ICD-10-CM | POA: Diagnosis present

## 2014-01-19 DIAGNOSIS — R42 Dizziness and giddiness: Secondary | ICD-10-CM

## 2014-01-19 DIAGNOSIS — Z880 Allergy status to penicillin: Secondary | ICD-10-CM

## 2014-01-19 DIAGNOSIS — I509 Heart failure, unspecified: Secondary | ICD-10-CM | POA: Diagnosis present

## 2014-01-19 DIAGNOSIS — K921 Melena: Secondary | ICD-10-CM

## 2014-01-19 DIAGNOSIS — I25119 Atherosclerotic heart disease of native coronary artery with unspecified angina pectoris: Secondary | ICD-10-CM

## 2014-01-19 DIAGNOSIS — Z7982 Long term (current) use of aspirin: Secondary | ICD-10-CM

## 2014-01-19 DIAGNOSIS — Z90710 Acquired absence of both cervix and uterus: Secondary | ICD-10-CM

## 2014-01-19 DIAGNOSIS — Z91199 Patient's noncompliance with other medical treatment and regimen due to unspecified reason: Secondary | ICD-10-CM

## 2014-01-19 DIAGNOSIS — R7989 Other specified abnormal findings of blood chemistry: Secondary | ICD-10-CM

## 2014-01-19 DIAGNOSIS — I10 Essential (primary) hypertension: Secondary | ICD-10-CM

## 2014-01-19 DIAGNOSIS — D509 Iron deficiency anemia, unspecified: Secondary | ICD-10-CM

## 2014-01-19 DIAGNOSIS — I70209 Unspecified atherosclerosis of native arteries of extremities, unspecified extremity: Secondary | ICD-10-CM | POA: Diagnosis present

## 2014-01-19 DIAGNOSIS — F17200 Nicotine dependence, unspecified, uncomplicated: Secondary | ICD-10-CM | POA: Diagnosis present

## 2014-01-19 DIAGNOSIS — D131 Benign neoplasm of stomach: Secondary | ICD-10-CM | POA: Diagnosis present

## 2014-01-19 DIAGNOSIS — R079 Chest pain, unspecified: Secondary | ICD-10-CM | POA: Diagnosis not present

## 2014-01-19 DIAGNOSIS — Z79899 Other long term (current) drug therapy: Secondary | ICD-10-CM

## 2014-01-19 DIAGNOSIS — G4700 Insomnia, unspecified: Secondary | ICD-10-CM | POA: Diagnosis present

## 2014-01-19 DIAGNOSIS — E876 Hypokalemia: Secondary | ICD-10-CM

## 2014-01-19 DIAGNOSIS — I5030 Unspecified diastolic (congestive) heart failure: Secondary | ICD-10-CM | POA: Diagnosis present

## 2014-01-19 DIAGNOSIS — Z9119 Patient's noncompliance with other medical treatment and regimen: Secondary | ICD-10-CM

## 2014-01-19 DIAGNOSIS — I428 Other cardiomyopathies: Secondary | ICD-10-CM | POA: Diagnosis present

## 2014-01-19 DIAGNOSIS — I6529 Occlusion and stenosis of unspecified carotid artery: Secondary | ICD-10-CM | POA: Diagnosis present

## 2014-01-19 DIAGNOSIS — I739 Peripheral vascular disease, unspecified: Secondary | ICD-10-CM

## 2014-01-19 DIAGNOSIS — F418 Other specified anxiety disorders: Secondary | ICD-10-CM | POA: Diagnosis present

## 2014-01-19 DIAGNOSIS — Z885 Allergy status to narcotic agent status: Secondary | ICD-10-CM

## 2014-01-19 DIAGNOSIS — F3289 Other specified depressive episodes: Secondary | ICD-10-CM | POA: Diagnosis present

## 2014-01-19 DIAGNOSIS — K219 Gastro-esophageal reflux disease without esophagitis: Secondary | ICD-10-CM | POA: Diagnosis present

## 2014-01-19 DIAGNOSIS — J4489 Other specified chronic obstructive pulmonary disease: Secondary | ICD-10-CM | POA: Diagnosis present

## 2014-01-19 DIAGNOSIS — I214 Non-ST elevation (NSTEMI) myocardial infarction: Principal | ICD-10-CM

## 2014-01-19 DIAGNOSIS — Z7902 Long term (current) use of antithrombotics/antiplatelets: Secondary | ICD-10-CM

## 2014-01-19 DIAGNOSIS — K5900 Constipation, unspecified: Secondary | ICD-10-CM

## 2014-01-19 DIAGNOSIS — E785 Hyperlipidemia, unspecified: Secondary | ICD-10-CM | POA: Diagnosis present

## 2014-01-19 DIAGNOSIS — D649 Anemia, unspecified: Secondary | ICD-10-CM

## 2014-01-19 DIAGNOSIS — R778 Other specified abnormalities of plasma proteins: Secondary | ICD-10-CM

## 2014-01-19 DIAGNOSIS — I2510 Atherosclerotic heart disease of native coronary artery without angina pectoris: Secondary | ICD-10-CM

## 2014-01-19 DIAGNOSIS — Z9861 Coronary angioplasty status: Secondary | ICD-10-CM

## 2014-01-19 DIAGNOSIS — T502X5A Adverse effect of carbonic-anhydrase inhibitors, benzothiadiazides and other diuretics, initial encounter: Secondary | ICD-10-CM | POA: Diagnosis present

## 2014-01-19 DIAGNOSIS — Z860100 Personal history of colon polyps, unspecified: Secondary | ICD-10-CM

## 2014-01-19 DIAGNOSIS — Z881 Allergy status to other antibiotic agents status: Secondary | ICD-10-CM

## 2014-01-19 DIAGNOSIS — F329 Major depressive disorder, single episode, unspecified: Secondary | ICD-10-CM | POA: Diagnosis present

## 2014-01-19 DIAGNOSIS — Z95810 Presence of automatic (implantable) cardiac defibrillator: Secondary | ICD-10-CM

## 2014-01-19 DIAGNOSIS — Z95820 Peripheral vascular angioplasty status with implants and grafts: Secondary | ICD-10-CM

## 2014-01-19 DIAGNOSIS — E119 Type 2 diabetes mellitus without complications: Secondary | ICD-10-CM | POA: Diagnosis present

## 2014-01-19 DIAGNOSIS — J449 Chronic obstructive pulmonary disease, unspecified: Secondary | ICD-10-CM | POA: Diagnosis present

## 2014-01-19 HISTORY — DX: Polyp of colon: K63.5

## 2014-01-19 HISTORY — DX: Other specified anxiety disorders: F41.8

## 2014-01-19 HISTORY — DX: Presence of automatic (implantable) cardiac defibrillator: Z95.810

## 2014-01-19 LAB — URINALYSIS, ROUTINE W REFLEX MICROSCOPIC
BILIRUBIN URINE: NEGATIVE
Glucose, UA: 100 mg/dL — AB
Hgb urine dipstick: NEGATIVE
KETONES UR: NEGATIVE mg/dL
Leukocytes, UA: NEGATIVE
NITRITE: NEGATIVE
PROTEIN: NEGATIVE mg/dL
Specific Gravity, Urine: 1.012 (ref 1.005–1.030)
UROBILINOGEN UA: 1 mg/dL (ref 0.0–1.0)
pH: 6 (ref 5.0–8.0)

## 2014-01-19 LAB — COMPREHENSIVE METABOLIC PANEL
ALT: 8 U/L (ref 0–35)
ANION GAP: 14 (ref 5–15)
AST: 12 U/L (ref 0–37)
Albumin: 3.8 g/dL (ref 3.5–5.2)
Alkaline Phosphatase: 114 U/L (ref 39–117)
BUN: 9 mg/dL (ref 6–23)
CALCIUM: 9.1 mg/dL (ref 8.4–10.5)
CO2: 29 meq/L (ref 19–32)
Chloride: 103 mEq/L (ref 96–112)
Creatinine, Ser: 0.84 mg/dL (ref 0.50–1.10)
GFR, EST AFRICAN AMERICAN: 77 mL/min — AB (ref >90)
GFR, EST NON AFRICAN AMERICAN: 66 mL/min — AB (ref >90)
GLUCOSE: 196 mg/dL — AB (ref 70–99)
Potassium: 2.6 mEq/L — CL (ref 3.7–5.3)
Sodium: 146 mEq/L (ref 137–147)
Total Bilirubin: <0.2 mg/dL — ABNORMAL LOW (ref 0.3–1.2)
Total Protein: 7.3 g/dL (ref 6.0–8.3)

## 2014-01-19 LAB — CBC
HCT: 28.3 % — ABNORMAL LOW (ref 36.0–46.0)
HEMOGLOBIN: 7.9 g/dL — AB (ref 12.0–15.0)
MCH: 20.6 pg — AB (ref 26.0–34.0)
MCHC: 27.9 g/dL — ABNORMAL LOW (ref 30.0–36.0)
MCV: 73.9 fL — ABNORMAL LOW (ref 78.0–100.0)
Platelets: 440 10*3/uL — ABNORMAL HIGH (ref 150–400)
RBC: 3.83 MIL/uL — AB (ref 3.87–5.11)
RDW: 16.8 % — ABNORMAL HIGH (ref 11.5–15.5)
WBC: 8.2 10*3/uL (ref 4.0–10.5)

## 2014-01-19 LAB — POC OCCULT BLOOD, ED: Fecal Occult Bld: NEGATIVE

## 2014-01-19 LAB — MRSA PCR SCREENING: MRSA by PCR: NEGATIVE

## 2014-01-19 LAB — TROPONIN I
TROPONIN I: 1.09 ng/mL — AB (ref <0.30)
TROPONIN I: 1.56 ng/mL — AB (ref <0.30)

## 2014-01-19 LAB — PRO B NATRIURETIC PEPTIDE: PRO B NATRI PEPTIDE: 185.1 pg/mL (ref 0–450)

## 2014-01-19 LAB — HEMOGLOBIN AND HEMATOCRIT, BLOOD
HCT: 26.7 % — ABNORMAL LOW (ref 36.0–46.0)
HEMOGLOBIN: 7.5 g/dL — AB (ref 12.0–15.0)

## 2014-01-19 LAB — PREPARE RBC (CROSSMATCH)

## 2014-01-19 MED ORDER — ACETAMINOPHEN 650 MG RE SUPP: 650.0000 mg | Freq: Four times a day (QID) | RECTAL | Status: DC | PRN

## 2014-01-19 MED ORDER — POLYETHYLENE GLYCOL 3350 17 G PO PACK
17.0000 g | PACK | Freq: Every day | ORAL | Status: DC | PRN
  Filled 2014-01-19: qty 1

## 2014-01-19 MED ORDER — POTASSIUM CHLORIDE 10 MEQ/100ML IV SOLN
10.0000 meq | INTRAVENOUS | Status: AC
  Administered 2014-01-19 (×2): 10 meq via INTRAVENOUS
  Filled 2014-01-19 (×4): qty 100

## 2014-01-19 MED ORDER — HYDROCODONE-ACETAMINOPHEN 10-325 MG PO TABS
1.0000 | ORAL_TABLET | Freq: Four times a day (QID) | ORAL | Status: DC | PRN
  Administered 2014-01-20: 1 via ORAL
  Filled 2014-01-19: qty 1

## 2014-01-19 MED ORDER — SODIUM CHLORIDE 0.9 % IV SOLN
INTRAVENOUS | Status: DC
  Administered 2014-01-19: 19:00:00 via INTRAVENOUS

## 2014-01-19 MED ORDER — ASPIRIN EC 81 MG PO TBEC
81.0000 mg | DELAYED_RELEASE_TABLET | Freq: Every day | ORAL | Status: DC
  Administered 2014-01-20 – 2014-01-23 (×4): 81 mg via ORAL
  Filled 2014-01-19 (×5): qty 1

## 2014-01-19 MED ORDER — ACETAMINOPHEN 325 MG PO TABS: 650.0000 mg | ORAL_TABLET | Freq: Four times a day (QID) | ORAL | Status: DC | PRN

## 2014-01-19 MED ORDER — ZOLPIDEM TARTRATE 5 MG PO TABS
5.0000 mg | ORAL_TABLET | Freq: Every evening | ORAL | Status: DC | PRN
  Administered 2014-01-20 – 2014-01-22 (×3): 5 mg via ORAL
  Filled 2014-01-19 (×3): qty 1

## 2014-01-19 MED ORDER — SODIUM CHLORIDE 0.9 % IJ SOLN
3.0000 mL | Freq: Two times a day (BID) | INTRAMUSCULAR | Status: DC
  Administered 2014-01-19 – 2014-01-23 (×8): 3 mL via INTRAVENOUS

## 2014-01-19 MED ORDER — LISINOPRIL 20 MG PO TABS
20.0000 mg | ORAL_TABLET | Freq: Every day | ORAL | Status: DC
  Administered 2014-01-19 – 2014-01-23 (×5): 20 mg via ORAL
  Filled 2014-01-19 (×5): qty 1

## 2014-01-19 MED ORDER — POTASSIUM CHLORIDE CRYS ER 20 MEQ PO TBCR
40.0000 meq | EXTENDED_RELEASE_TABLET | Freq: Once | ORAL | Status: AC
  Administered 2014-01-19: 40 meq via ORAL

## 2014-01-19 MED ORDER — ONDANSETRON HCL 4 MG/2ML IJ SOLN: 4.0000 mg | Freq: Four times a day (QID) | INTRAMUSCULAR | Status: DC | PRN

## 2014-01-19 MED ORDER — DULOXETINE HCL 60 MG PO CPEP
60.0000 mg | ORAL_CAPSULE | Freq: Two times a day (BID) | ORAL | Status: DC
  Administered 2014-01-19 – 2014-01-23 (×8): 60 mg via ORAL
  Filled 2014-01-19 (×9): qty 1

## 2014-01-19 MED ORDER — NITROGLYCERIN 0.4 MG SL SUBL
0.4000 mg | SUBLINGUAL_TABLET | SUBLINGUAL | Status: DC | PRN
  Administered 2014-01-20: 0.4 mg via SUBLINGUAL
  Filled 2014-01-19: qty 1

## 2014-01-19 MED ORDER — MECLIZINE HCL 25 MG PO TABS
25.0000 mg | ORAL_TABLET | ORAL | Status: DC | PRN
  Filled 2014-01-19: qty 1

## 2014-01-19 MED ORDER — POTASSIUM CHLORIDE CRYS ER 20 MEQ PO TBCR
40.0000 meq | EXTENDED_RELEASE_TABLET | Freq: Two times a day (BID) | ORAL | Status: DC
Start: 2014-01-19 — End: 2014-01-22
  Administered 2014-01-20 – 2014-01-22 (×5): 40 meq via ORAL
  Filled 2014-01-19 (×7): qty 2

## 2014-01-19 MED ORDER — SODIUM CHLORIDE 0.9 % IV SOLN
Freq: Once | INTRAVENOUS | Status: AC
  Administered 2014-01-20: 03:00:00 via INTRAVENOUS

## 2014-01-19 MED ORDER — ALPRAZOLAM 0.5 MG PO TABS
0.5000 mg | ORAL_TABLET | Freq: Every evening | ORAL | Status: DC | PRN
  Administered 2014-01-20 (×2): 0.5 mg via ORAL
  Filled 2014-01-19 (×2): qty 1

## 2014-01-19 MED ORDER — FUROSEMIDE 80 MG PO TABS
80.0000 mg | ORAL_TABLET | Freq: Every day | ORAL | Status: DC
  Administered 2014-01-21 – 2014-01-23 (×3): 80 mg via ORAL
  Filled 2014-01-19 (×3): qty 1

## 2014-01-19 MED ORDER — ONDANSETRON HCL 4 MG PO TABS: 4.0000 mg | ORAL_TABLET | Freq: Four times a day (QID) | ORAL | Status: DC | PRN

## 2014-01-19 MED ORDER — METOPROLOL TARTRATE 25 MG PO TABS
25.0000 mg | ORAL_TABLET | Freq: Every day | ORAL | Status: DC
  Administered 2014-01-19 – 2014-01-23 (×5): 25 mg via ORAL
  Filled 2014-01-19 (×5): qty 1

## 2014-01-19 MED ORDER — PANTOPRAZOLE SODIUM 40 MG PO TBEC
40.0000 mg | DELAYED_RELEASE_TABLET | Freq: Two times a day (BID) | ORAL | Status: DC
  Administered 2014-01-20: 40 mg via ORAL
  Filled 2014-01-19: qty 1

## 2014-01-19 MED ORDER — FUROSEMIDE 10 MG/ML IJ SOLN
40.0000 mg | Freq: Once | INTRAMUSCULAR | Status: AC
  Administered 2014-01-20: 40 mg via INTRAVENOUS
  Filled 2014-01-19: qty 4

## 2014-01-19 MED ORDER — POTASSIUM CHLORIDE CRYS ER 20 MEQ PO TBCR
40.0000 meq | EXTENDED_RELEASE_TABLET | Freq: Once | ORAL | Status: AC
  Administered 2014-01-19: 40 meq via ORAL
  Filled 2014-01-19: qty 2

## 2014-01-19 NOTE — ED Notes (Signed)
MD at bedside. Dr. Walden. 

## 2014-01-19 NOTE — ED Notes (Signed)
Pt reports sent over here by Dr. Barbie Banner office due to low hemoglobin and the possibility of a heart attack. Pt reports chest pain intermittent x 2 months. Reports black stool x 3 months, but thought it was the pepsi she was drinking. Reports shortness of breath and generalized weakness.

## 2014-01-19 NOTE — H&P (Signed)
[facility] History and Physical  TRENELL MOXEY JEH:[id number] DOB: 04-Feb-1936 DOA: 01/19/2014  Referring physician: EDP PCP: Laurey Morale, MD   Chief Complaint: Chest pain, low hemoglobin her PCP  HPI: CAROLYNA YERIAN is a 78 y.o. female with multiple medical problems as listed below including extensive vascular disease, hyperlipidemia, GERD, hypertension, COPD, anemia, vertigo who presents with above complaints. She states that for the past 2 months she's had intermittent chest pain-on both sides of the chest, worse with exertion and associated with shortness of breath. She denies diaphoresis. She reports that she saw her PCP and that was done and her hemoglobin was 7.9 (from 11.2- 1 month ago) and she was sent to the ED. She admits to melena over the past 2 months. She was seen in the ED and chest x-ray was negative for acute infiltrates, troponin elevated at 1, hemoglobin was 7.9 and on rectal exam per EDP she had brown stool and was was guaiac-negative. She denies abdominal pain diarrhea and no fevers. EKG showed a paced rhythm with no acute ischemic changes. Cardiology was consulted, admission to [facility] was requested for further evaluation and management. Patient reports that she had a colonoscopy done at [facility] November 2014 with polyp removal and was told that it was otherwise negative   Review of Systems The patient denies anorexia, fever, weight loss,, vision loss, decreased hearing, hoarseness, syncope,, peripheral edema, balance deficits, hemoptysis, severe indigestion/heartburn, hematuria, incontinence, genital sores,, suspicious skin lesions, transient blindness, difficulty walking, depression, unusual weight change, abnormal bleeding, enlarged lymph nodes, angioedema, and breast masses.   Past Medical History  Diagnosis Date  . COPD (chronic obstructive pulmonary disease)   . Hx of colonic polyps   . Insomnia   . Fibromyalgia   . Hyperlipemia     takes Crestor  daily  . CAD (coronary artery disease)     Currently angina free, no evidence of reversible ischemia  . Vertigo     takes Meclizine prn  . Subclavian arterial stenosis, lt, with PTA/STENT 07/31/11 08/01/2011  . S/P angioplasty with stent, lt. subclavian 07/31/11 08/01/2011  . Hypertension     takes Amlodipine and Metoprolol daily  . Bronchitis   . Arthritis   . Chronic back pain   . GERD (gastroesophageal reflux disease)     was on meds but was taken off;now watches what she eats  . Chronic constipation   . Hemorrhoids   . Urinary incontinence   . Early cataracts, bilateral   . Anxiety   . Depression     takes Cymbalta daily  . Congestive heart failure (CHF)     New York Heart Association functional class 2, diastolic dysfunction  . PAD (peripheral artery disease)     Carotid, subclavian, and lower extremity beds, currently not symptomatic  . Syncope 07/28/2011    EF - 50-55, moderate concentric hypertrophy in left ventricle  . LBBB (left bundle branch block)     Stress test 09/03/2010, EF 55  . Nephrolithiasis   . Automatic implantable cardioverter-defibrillator in situ   . Pacemaker   . Shortness of breath     when over exerting self per pt.   Past Surgical History  Procedure Laterality Date  . Appendectomy    . Tonsillectomy    . Tubal ligation    . Cardiac defibrillator placement  05/2008    By Dr Blanch Media, [employer] CANNOT HAVE MRI's  . Abdominal hysterectomy    . Back surgery  2012  . Orif elbow  fracture  08/16/2011    Procedure: OPEN REDUCTION INTERNAL FIXATION (ORIF) ELBOW/OLECRANON FRACTURE;  Surgeon: Schuyler Amor, MD;  Location: [place];  Service: Orthopedics;  Laterality: Left;  . Cardiac catheterization  12/01/2007    By Dr. Melvern Banker, left heart cath,   . Coronary angioplasty    . Subclavian stent placement Left 07/31/2011    7x18 Genesis, balloon, with reduction of 90% ostial left subclavian artery stenosis to 0% with residual excellent flow  . Femoral-popliteal  bypass graft Right 10/12/2013    Procedure:   Femoral-Peroneal trunk  bypass with nonreversed greater saphenous vein graft;  Surgeon: Elam Dutch, MD;  Location: [place];  Service: Vascular;  Laterality: Right;  . Intraoperative arteriogram Right 10/12/2013    Procedure: INTRA OPERATIVE ARTERIOGRAM;  Surgeon: Elam Dutch, MD;  Location: [place];  Service: Vascular;  Laterality: Right;   Social History:  reports that she has been smoking Cigarettes.  She has a 30 pack-year smoking history. She has never used smokeless tobacco. She reports that she does not drink alcohol or use illicit drugs.  Allergies  Allergen Reactions  . Codeine Itching  . Darvon Itching  . Meloxicam Other (See Comments)    Unknown    . Potassium-Containing Compounds Other (See Comments)    Causes severe constipation  . Propoxyphene N-Acetaminophen Itching  . Rofecoxib Other (See Comments)    Unknown    . Azithromycin Rash  . Erythromycin Rash  . Penicillins Rash  . Sulfa Antibiotics Rash    Family History  Problem Relation Age of Onset  . Hyperlipidemia    . Anesthesia problems Neg Hx   . Hypotension Neg Hx   . Malignant hyperthermia Neg Hx   . Pseudochol deficiency Neg Hx   . Colon cancer Maternal Grandmother   . Cancer Sister     ovarian  . Diabetes Sister   . Heart disease Sister   . CAD Father   . Heart disease Father   . Hyperlipidemia Father   . Heart disease Mother   . Deep vein thrombosis Son      Prior to Admission medications   Medication Sig Start Date End Date Taking? Authorizing Provider  ALPRAZolam Duanne Moron) 1 MG tablet Take 0.5 tablets (0.5 mg total) by mouth at bedtime as needed for anxiety. 10/16/13  Yes Elam Dutch, MD  aspirin 325 MG tablet Take 81.25 mg by mouth daily. Take 1/4 tablet daily.   Yes Historical Provider, MD  cetirizine (ZYRTEC) 10 MG tablet Take 10 mg by mouth daily.   Yes Historical Provider, MD  clopidogrel (PLAVIX) 75 MG tablet Take 1 tablet (75 mg total)  by mouth daily. 10/24/13  Yes Laurey Morale, MD  DULoxetine (CYMBALTA) 60 MG capsule Take 1 capsule (60 mg total) by mouth 2 (two) times daily. 12/15/13  Yes Laurey Morale, MD  furosemide (LASIX) 80 MG tablet Take 80 mg by mouth daily.  04/20/13  Yes Laurey Morale, MD  HYDROcodone-acetaminophen (NORCO) 10-325 MG per tablet Take 1 tablet by mouth every 6 (six) hours as needed for moderate pain. 12/21/13  Yes Laurey Morale, MD  lisinopril (PRINIVIL,ZESTRIL) 20 MG tablet Take 1 tablet (20 mg total) by mouth daily. 12/21/13  Yes Laurey Morale, MD  meclizine (ANTIVERT) 25 MG tablet Take 1 tablet (25 mg total) by mouth every 4 (four) hours as needed for dizziness. 12/01/13  Yes Laurey Morale, MD  metoprolol tartrate (LOPRESSOR) 25 MG tablet Take 1 tablet (  25 mg total) by mouth daily. 12/13/13  Yes Laurey Morale, MD  polyethylene glycol [facility] / GLYCOLAX) packet Take 17 g by mouth daily as needed for mild constipation.   Yes Historical Provider, MD  potassium chloride SA (K-DUR,KLOR-CON) 20 MEQ tablet Take 40 mEq by mouth 2 (two) times daily.   Yes Historical Provider, MD  zolpidem (AMBIEN) 10 MG tablet Take 1 tablet (10 mg total) by mouth at bedtime. 01/06/14 02/05/14  Laurey Morale, MD   Physical Exam: Filed Vitals:   01/19/14 1745  BP: 171/57  Pulse: 74  Temp: 98 F (36.7 C)  Resp: 18    BP 171/57  Pulse 74  Temp(Src) 98 F (36.7 C) (Oral)  Resp 18  Ht 5' 6.5" (1.689 m)  Wt 81.738 kg (180 lb 3.2 oz)  BMI 28.65 kg/m2  SpO2 100% Constitutional: Vital signs reviewed.  Patient is a well-developed and well-nourished  in no acute distress and cooperative with exam. Alert and oriented x3.  Head: Normocephalic and atraumatic Mouth: no erythema or exudates, MMM Eyes: PERRL, EOMI, conjunctivae normal, No scleral icterus.  Neck: Supple, Trachea midline normal ROM, No JVD, mass, thyromegaly, or carotid bruit present.  Cardiovascular: RRR, S1 normal, S2 normal, no MRG, pulses symmetric and intact  bilaterally Pulmonary/Chest: normal respiratory effort, CTAB, no wheezes, rales, or rhonchi Abdominal: Soft. Non-tender, non-distended, bowel sounds are normal, no masses, organomegaly, or guarding present.  GU: no CVA tenderness  Extremities: No cyanosis and no edema  Neurological: A&O x3, Strength is normal and symmetric bilaterally, cranial nerve II-XII are grossly intact, no focal motor deficit, sensory intact to light touch bilaterally.  Skin: Warm, dry and intact. No rash, cyanosis, or clubbing.  Psychiatric: Normal mood and affect. speech and behavior is normal. Judgment and thought content normal. Cognition and memory are normal.                Labs on Admission:  Basic Metabolic Panel:  Recent Labs Lab 01/19/14 1333  NA 146  K 2.6*  CL 103  CO2 29  GLUCOSE 196*  BUN 9  CREATININE 0.84  CALCIUM 9.1   Liver Function Tests:  Recent Labs Lab 01/19/14 1333  AST 12  ALT 8  ALKPHOS 114  BILITOT <0.2*  PROT 7.3  ALBUMIN 3.8   No results found for this basename: LIPASE, AMYLASE,  in the last 168 hours No results found for this basename: AMMONIA,  in the last 168 hours CBC:  Recent Labs Lab 01/19/14 1333  WBC 8.2  HGB 7.9*  HCT 28.3*  MCV 73.9*  PLT 440*   Cardiac Enzymes:  Recent Labs Lab 01/19/14 1333  TROPONINI 1.09*    BNP (last 3 results)  Recent Labs  01/19/14 1333  PROBNP 185.1   CBG: No results found for this basename: GLUCAP,  in the last 168 hours  Radiological Exams on Admission: Dg Chest 2 View  01/19/2014   CLINICAL DATA:  Short of breath.  Chest pain.  EXAM: CHEST  2 VIEW  COMPARISON:  10/07/2013  FINDINGS: Cardiac silhouette is normal in size. Normal mediastinal and hilar contours. Left anterior chest wall AICD is stable in well positioned.  Stable granuloma projects posteriorly in the mid right or left lower lobe superior segment on the lateral view. Lungs are hyperexpanded with minor interstitial thickening mostly at the  peripheral lung bases. No lung consolidation or edema. No pleural effusion or pneumothorax.  Bony thorax is demineralized but grossly intact.  IMPRESSION: No  acute cardiopulmonary disease.   Electronically Signed   By: Lajean Manes M.D.   On: 01/19/2014 14:04     Assessment/Plan Active Problems:   Chest pain /Elevated troponin -Possibly secondary to demand ischemia in setting of worsening anemia, but she has risk factors-CAD, 75% LAD, 75% RCA 2009, low risk Myoview 2/12, PAD -Will transfuse 2 units of packed red blood cells -Cycle cardiac enzymes, was consulted and appreciated Dr. Sherryl Barters assistance -Per cards will continue low-dose aspirin for now on hold off Plavix -Place on PPI   Microcytic anemia -As discussed above she gives history of melena but guaiac negative with brown stool on rectal exam per EDP -Have consulted GI(she states she seen Dr. Hilarie Fredrickson in the past)>> discussed with Dr. Henrene Pastor and he agrees with transfusing given her history of CAD and presentation with chest pain -Monitor his serial H&H's and further transfuse as appropriate -Will keep n.p.o. after midnight -As noted above she had a colonoscopy [facility] November of 2014-polyp removed at that time and she was told was otherwise negative   DEPRESSION -Continue outpatient medications   GERD   HISTORY OF CONSTIPATION -Continue MiraLAX   History of Vertigo -Continue Antivert Hypokalemia  -Secondary to diuretics -Replace K.  s/pBiventricular implantable cardioverter-defibrillator in situ    Code Status: full Family Communication:son at bedside Disposition Plan: admit to [place]  Time spent:>30MINS  [facility] Pager [phone]

## 2014-01-19 NOTE — Telephone Encounter (Signed)
I spoke with pt and she is going to the ER now, she was complaining of chest pain and due to recent lab results we recommend getting treament immediately. I called [place] and spoke with a triage nurse and gave this information along with lab results.

## 2014-01-19 NOTE — Consult Note (Signed)
CARDIOLOGY CONSULT NOTE   Patient ID: Sydney Phillips MRN: [id number], DOB/AGE: 76-Nov-1937   Admit date: 01/19/2014 Date of Consult: 01/19/2014   Primary Physician: Laurey Morale, MD Primary Cardiologist: Dr. Stanford Breed  Pt. Profile  This is a 78 year old woman with widespread vascular disease who presents to the emergency room because of worsening fatigue and found to have hemoglobin of 7.9 on outpatient lab work yesterday.  Problem List  Past Medical History  Diagnosis Date  . COPD (chronic obstructive pulmonary disease)   . Hx of colonic polyps   . Insomnia   . Fibromyalgia   . Hyperlipemia     takes Crestor daily  . CAD (coronary artery disease)     Currently angina free, no evidence of reversible ischemia  . Vertigo     takes Meclizine prn  . Subclavian arterial stenosis, lt, with PTA/STENT 07/31/11 08/01/2011  . S/P angioplasty with stent, lt. subclavian 07/31/11 08/01/2011  . Hypertension     takes Amlodipine and Metoprolol daily  . Bronchitis   . Arthritis   . Chronic back pain   . GERD (gastroesophageal reflux disease)     was on meds but was taken off;now watches what she eats  . Chronic constipation   . Hemorrhoids   . Urinary incontinence   . Early cataracts, bilateral   . Anxiety   . Depression     takes Cymbalta daily  . Congestive heart failure (CHF)     New York Heart Association functional class 2, diastolic dysfunction  . PAD (peripheral artery disease)     Carotid, subclavian, and lower extremity beds, currently not symptomatic  . Syncope 07/28/2011    EF - 50-55, moderate concentric hypertrophy in left ventricle  . LBBB (left bundle branch block)     Stress test 09/03/2010, EF 55  . Nephrolithiasis   . Automatic implantable cardioverter-defibrillator in situ   . Pacemaker   . Shortness of breath     when over exerting self per pt.    Past Surgical History  Procedure Laterality Date  . Appendectomy    . Tonsillectomy    . Tubal ligation    .  Cardiac defibrillator placement  05/2008    By Dr Blanch Media, [employer] CANNOT HAVE MRI's  . Abdominal hysterectomy    . Back surgery  2012  . Orif elbow fracture  08/16/2011    Procedure: OPEN REDUCTION INTERNAL FIXATION (ORIF) ELBOW/OLECRANON FRACTURE;  Surgeon: Schuyler Amor, MD;  Location: [place];  Service: Orthopedics;  Laterality: Left;  . Cardiac catheterization  12/01/2007    By Dr. Melvern Banker, left heart cath,   . Coronary angioplasty    . Subclavian stent placement Left 07/31/2011    7x18 Genesis, balloon, with reduction of 90% ostial left subclavian artery stenosis to 0% with residual excellent flow  . Femoral-popliteal bypass graft Right 10/12/2013    Procedure:   Femoral-Peroneal trunk  bypass with nonreversed greater saphenous vein graft;  Surgeon: Elam Dutch, MD;  Location: [place];  Service: Vascular;  Laterality: Right;  . Intraoperative arteriogram Right 10/12/2013    Procedure: INTRA OPERATIVE ARTERIOGRAM;  Surgeon: Elam Dutch, MD;  Location: [place];  Service: Vascular;  Laterality: Right;     Allergies  Allergies  Allergen Reactions  . Codeine Itching  . Darvon Itching  . Meloxicam Other (See Comments)    Unknown    . Potassium-Containing Compounds Other (See Comments)    Causes severe constipation  . Propoxyphene N-Acetaminophen Itching  .  Rofecoxib Other (See Comments)    Unknown    . Azithromycin Rash  . Erythromycin Rash  . Penicillins Rash  . Sulfa Antibiotics Rash    HPI    this 78 year old woman is admitted because of worsening fatigue and exertional dyspnea .she has had dark tarry stools and her hemoglobin is 7.9 she has not been aware of any bright red hematochezia .she has a past history of ischemic heart disease.  She previously was followed by Dr. Gwenlyn Found.  She is now followed by Dr. Stanford Breed. Patient has a history of cardiomyopathy that was felt to be mixed. Her ejection fraction in 2009 was 20%. Catheterization revealed a 75% LAD and a 75% right  coronary artery. She had a biventricular ICD placed at that time. Last echocardiogram in February of 2013 showed an ejection fraction of 67-12%, grade 1 diastolic dysfunction and mild mitral regurgitation. Myoview in March of 2012 showed an ejection fraction of 55% and normal perfusion. Patient also has peripheral vascular disease. Arteriogram in February 2013 showed a 70% right internal carotid artery stenosis. There was a 30-40% left carotid stenosis. There was a 90% left subclavian. Patient had stent of her left subclavian at that time.  The patient continues to smoke.  She is not interested in stopping smoking.  The patient has exertional dyspnea.  She has a dry cough.  She has had problems with vertigo in the past and has taken meclizine but states that the meclizine causes her to have chest pain.  She has a past history of colon polyps and had colonoscopy last year.  She states that her stools have been abnormal since the colonoscopy.  The patient has an ICD pacemaker in place.  She has never had any shocks from her ICD.   Inpatient Medications    Family History Family History  Problem Relation Age of Onset  . Hyperlipidemia    . Anesthesia problems Neg Hx   . Hypotension Neg Hx   . Malignant hyperthermia Neg Hx   . Pseudochol deficiency Neg Hx   . Colon cancer Maternal Grandmother   . Cancer Sister     ovarian  . Diabetes Sister   . Heart disease Sister   . CAD Father   . Heart disease Father   . Hyperlipidemia Father   . Heart disease Mother   . Deep vein thrombosis Son      Social History History   Social History  . Marital Status: Widowed    Spouse Name: N/A    Number of Children: 3  . Years of Education: N/A   Occupational History  . Not on file.   Social History Main Topics  . Smoking status: Current Every Day Smoker -- 0.50 packs/day for 60 years    Types: Cigarettes  . Smokeless tobacco: Never Used  . Alcohol Use: No  . Drug Use: No  . Sexual Activity: No    Other Topics Concern  . Not on file   Social History Narrative   Ok to share information with medical POA, Son Gabriel Carina     Review of Systems  General:  No chills, fever, night sweats or weight changes.  Cardiovascular:  No chest pain,  positive for dyspnea on exertion   Dermatological: No rash, lesions/masses Respiratory: No cough, dyspnea Urologic: No hematuria, dysuria Abdominal:   No nausea, vomiting, diarrhea, bright red blood per rectum, melena, or hematemesis Neurologic:  No visual changes, wkns, changes in mental status. All other systems reviewed and  are otherwise negative except as noted above.  Physical Exam  Blood pressure 147/51, pulse 75, temperature 98.2 F (36.8 C), temperature source Oral, resp. rate 21, SpO2 96.00%.  General: Pleasant, NAD Psych: Normal affect. Neuro: Alert and oriented X 3. Moves all extremities spontaneously. HEENT: Normal  Neck:  there are bilateral carotid bruits  Lungs:  Resp regular and unlabored,  there are scattered rhonchi Heart:  atrial sensed ventricular paced rhythm.  Grade 2/6 systolic ejection murmur at left sternal edge Abdomen: Soft, non-tender, non-distended, BS + x 4. No abdominal masses felt.  Abdomen is not tender.  Extremities: No clubbing, cyanosis or edema. DP/PT/Radials 1+ and equal bilaterally.  Labs   Recent Labs  01/19/14 1333  TROPONINI 1.09*   Lab Results  Component Value Date   WBC 8.2 01/19/2014   HGB 7.9* 01/19/2014   HCT 28.3* 01/19/2014   MCV 73.9* 01/19/2014   PLT 440* 01/19/2014     Recent Labs Lab 01/19/14 1333  NA 146  K 2.6*  CL 103  CO2 29  BUN 9  CREATININE 0.84  CALCIUM 9.1  PROT 7.3  BILITOT <0.2*  ALKPHOS 114  ALT 8  AST 12  GLUCOSE 196*   Lab Results  Component Value Date   CHOL 225* 07/30/2011   HDL 29* 07/30/2011   LDLCALC 145* 07/30/2011   TRIG 256* 07/30/2011   Lab Results  Component Value Date   DDIMER  Value: 0.55        AT THE INHOUSE ESTABLISHED CUTOFF VALUE OF  0.48 ug/mL FEU, THIS ASSAY HAS BEEN DOCUMENTED IN THE LITERATURE TO HAVE* 12/01/2007    Radiology/Studies  Dg Chest 2 View  01/19/2014   CLINICAL DATA:  Short of breath.  Chest pain.  EXAM: CHEST  2 VIEW  COMPARISON:  10/07/2013  FINDINGS: Cardiac silhouette is normal in size. Normal mediastinal and hilar contours. Left anterior chest wall AICD is stable in well positioned.  Stable granuloma projects posteriorly in the mid right or left lower lobe superior segment on the lateral view. Lungs are hyperexpanded with minor interstitial thickening mostly at the peripheral lung bases. No lung consolidation or edema. No pleural effusion or pneumothorax.  Bony thorax is demineralized but grossly intact.  IMPRESSION: No acute cardiopulmonary disease.   Electronically Signed   By: Lajean Manes M.D.   On: 01/19/2014 14:04    ECG  Atrial-sensed ventricular-paced rhythm Biventricular pacemaker detected Abnormal ECG [place] to prior Confirmed by [place]  1. severe anemia, with macrocytic indices suggesting possible chronic blood loss .her history of dark stools would suggest GI blood loss although in the emergency room her stool is negative for blood  2. chronic ischemic heart disease with  non-STEMI likely type 2 secondary to stress of anemia and marked hypokalemia  3. cardiomyopathy with functioning ICD pacemaker defibrillator.  Previously systolic dysfunction, more recently diastolic dysfunction with normal EF 4. hyperlipidemia not currently on statin 5. ongoing tobacco abuse 6. widespread vascular disease with bilateral carotid bruits and with previous stent to the left subclavian.  She also has had right femoral to tibial peroneal trunk bypass with vein on 10/12/2013    plan: The patient will be admitted on the medical service for evaluation of her severe anemia and her hypokalemia.  In regard to her cardiac status, we will cycle enzymes.  We will update her echocardiogram.  We  will resume Crestor.  She is presently on both aspirin and Plavix.  At this point  until we know the status of her GI tract and given the likely need for possible endoscopy I will stop the plavix and continue aspirin 81 mg daily alone. Add protonix.  Signed, Darlin Coco, MD  01/19/2014, 4:25 PM

## 2014-01-19 NOTE — Telephone Encounter (Signed)
Per Dr. Sarajane Jews recommendations based on recent lab results from [facility] hemoglobin 7.9, left a voice message advising pt to go to the ER. I also spoke with [name] and he was leaving work now to go home and get pt to take to the ER. Her hemoglobin has dropped from 15-7 in three months, almost certainly from GI issues. This number is dangerously low and needs treatment now. I left a detailed voice message for pt with this information.

## 2014-01-19 NOTE — Progress Notes (Signed)
01/19/14 22:00 Patient very upset because she cannot eat as per order,MD on call text paged.MD also made aware of pt having gotten total of 80 meq p.o potassium and also has order for 4 runs K+,second run infusing and still to get another 40 meq potassium p.o. Per K. Schorr,don't give the remaining two runs and p.o potassium.Diet order received.Will continue to monitor pt. Malik Paar, Wonda Cheng, Therapist, sports

## 2014-01-19 NOTE — ED Notes (Signed)
Patient transported to X-ray 

## 2014-01-19 NOTE — ED Provider Notes (Signed)
CSN: [id number]     Arrival date & time 01/19/14  1319 History   First MD Initiated Contact with Patient 01/19/14 1338     Chief Complaint  Patient presents with  . Abnormal Lab  . Chest Pain     (Consider location/radiation/quality/duration/timing/severity/associated sxs/prior Treatment) HPI Comments: Sent to ED because labs show anemia - Hgb of 7.9.  Patient is a 78 y.o. female presenting with chest pain. The history is provided by the patient.  Chest Pain Pain location:  L chest and R chest Pain quality: sharp   Pain radiates to:  Upper back Pain radiates to the back: yes   Pain severity:  Moderate Onset quality:  Gradual Duration:  2 months Timing:  Intermittent Progression:  Worsening Chronicity:  New Context: not at rest and no stress   Context comment:  With exertion Relieved by:  Rest Worsened by:  Exertion Associated symptoms: no abdominal pain     Past Medical History  Diagnosis Date  . COPD (chronic obstructive pulmonary disease)   . Hx of colonic polyps   . Insomnia   . Fibromyalgia   . Hyperlipemia     takes Crestor daily  . CAD (coronary artery disease)     Currently angina free, no evidence of reversible ischemia  . Vertigo     takes Meclizine prn  . Subclavian arterial stenosis, lt, with PTA/STENT 07/31/11 08/01/2011  . S/P angioplasty with stent, lt. subclavian 07/31/11 08/01/2011  . Hypertension     takes Amlodipine and Metoprolol daily  . Bronchitis   . Arthritis   . Chronic back pain   . GERD (gastroesophageal reflux disease)     was on meds but was taken off;now watches what she eats  . Chronic constipation   . Hemorrhoids   . Urinary incontinence   . Early cataracts, bilateral   . Anxiety   . Depression     takes Cymbalta daily  . Congestive heart failure (CHF)     New York Heart Association functional class 2, diastolic dysfunction  . PAD (peripheral artery disease)     Carotid, subclavian, and lower extremity beds, currently not  symptomatic  . Syncope 07/28/2011    EF - 50-55, moderate concentric hypertrophy in left ventricle  . LBBB (left bundle branch block)     Stress test 09/03/2010, EF 55  . Nephrolithiasis   . Automatic implantable cardioverter-defibrillator in situ   . Pacemaker   . Shortness of breath     when over exerting self per pt.   Past Surgical History  Procedure Laterality Date  . Appendectomy    . Tonsillectomy    . Tubal ligation    . Cardiac defibrillator placement  05/2008    By Dr Blanch Media, [employer] CANNOT HAVE MRI's  . Abdominal hysterectomy    . Back surgery  2012  . Orif elbow fracture  08/16/2011    Procedure: OPEN REDUCTION INTERNAL FIXATION (ORIF) ELBOW/OLECRANON FRACTURE;  Surgeon: Schuyler Amor, MD;  Location: [facility];  Service: Orthopedics;  Laterality: Left;  . Cardiac catheterization  12/01/2007    By Dr. Melvern Banker, left heart cath,   . Coronary angioplasty    . Subclavian stent placement Left 07/31/2011    7x18 [employer], balloon, with reduction of 90% ostial left subclavian artery stenosis to 0% with residual excellent flow  . Femoral-popliteal bypass graft Right 10/12/2013    Procedure:   Femoral-Peroneal trunk  bypass with nonreversed greater saphenous vein graft;  Surgeon: Elam Dutch, MD;  Location: [facility] OR;  Service: Vascular;  Laterality: Right;  . Intraoperative arteriogram Right 10/12/2013    Procedure: INTRA OPERATIVE ARTERIOGRAM;  Surgeon: Elam Dutch, MD;  Location: [facility] OR;  Service: Vascular;  Laterality: Right;   Family History  Problem Relation Age of Onset  . Hyperlipidemia    . Anesthesia problems Neg Hx   . Hypotension Neg Hx   . Malignant hyperthermia Neg Hx   . Pseudochol deficiency Neg Hx   . Colon cancer Maternal Grandmother   . Cancer Sister     ovarian  . Diabetes Sister   . Heart disease Sister   . CAD Father   . Heart disease Father   . Hyperlipidemia Father   . Heart disease Mother   . Deep vein thrombosis Son    History  Substance  Use Topics  . Smoking status: Current Every Day Smoker -- 0.50 packs/day for 60 years    Types: Cigarettes  . Smokeless tobacco: Never Used  . Alcohol Use: No   OB History   Grav Para Term Preterm Abortions TAB SAB Ect Mult Living                 Review of Systems  Cardiovascular: Positive for chest pain.  Gastrointestinal: Negative for abdominal pain.  All other systems reviewed and are negative.     Allergies  Codeine; Darvon; Meloxicam; Potassium-containing compounds; Propoxyphene n-acetaminophen; Rofecoxib; Azithromycin; Erythromycin; Penicillins; and Sulfa antibiotics  Home Medications   Prior to Admission medications   Medication Sig Start Date End Date Taking? Authorizing Provider  ALPRAZolam Duanne Moron) 1 MG tablet Take 0.5 tablets (0.5 mg total) by mouth at bedtime as needed for anxiety. 10/16/13  Yes Elam Dutch, MD  aspirin 325 MG tablet Take 81.25 mg by mouth daily. Take 1/4 tablet daily.   Yes Historical Provider, MD  cetirizine (ZYRTEC) 10 MG tablet Take 10 mg by mouth daily.   Yes Historical Provider, MD  clopidogrel (PLAVIX) 75 MG tablet Take 1 tablet (75 mg total) by mouth daily. 10/24/13  Yes Laurey Morale, MD  DULoxetine (CYMBALTA) 60 MG capsule Take 1 capsule (60 mg total) by mouth 2 (two) times daily. 12/15/13  Yes Laurey Morale, MD  furosemide (LASIX) 80 MG tablet Take 80 mg by mouth daily.  04/20/13  Yes Laurey Morale, MD  HYDROcodone-acetaminophen (NORCO) 10-325 MG per tablet Take 1 tablet by mouth every 6 (six) hours as needed for moderate pain. 12/21/13  Yes Laurey Morale, MD  lisinopril (PRINIVIL,ZESTRIL) 20 MG tablet Take 1 tablet (20 mg total) by mouth daily. 12/21/13  Yes Laurey Morale, MD  meclizine (ANTIVERT) 25 MG tablet Take 1 tablet (25 mg total) by mouth every 4 (four) hours as needed for dizziness. 12/01/13  Yes Laurey Morale, MD  metoprolol tartrate (LOPRESSOR) 25 MG tablet Take 1 tablet (25 mg total) by mouth daily. 12/13/13  Yes Laurey Morale, MD   polyethylene glycol [facility] / GLYCOLAX) packet Take 17 g by mouth daily as needed for mild constipation.   Yes Historical Provider, MD  potassium chloride SA (K-DUR,KLOR-CON) 20 MEQ tablet Take 40 mEq by mouth 2 (two) times daily.   Yes Historical Provider, MD  zolpidem (AMBIEN) 10 MG tablet Take 1 tablet (10 mg total) by mouth at bedtime. 01/06/14 02/05/14  Laurey Morale, MD   BP 144/54  Pulse 87  Temp(Src) 98.2 F (36.8 C) (Oral)  Resp 15  SpO2 100% Physical Exam  Nursing note  and vitals reviewed. Constitutional: She is oriented to person, place, and time. She appears well-developed and well-nourished. No distress.  HENT:  Head: Normocephalic and atraumatic.  Eyes: EOM are normal. Pupils are equal, round, and reactive to light.  Neck: Normal range of motion. Neck supple.  Cardiovascular: Normal rate and regular rhythm.  Exam reveals no friction rub.   No murmur heard. Pulmonary/Chest: Effort normal and breath sounds normal. No respiratory distress. She has no wheezes. She has no rales.  Abdominal: Soft. She exhibits no distension. There is no tenderness. There is no rebound.  Musculoskeletal: Normal range of motion. She exhibits no edema.  Neurological: She is alert and oriented to person, place, and time.  Skin: She is not diaphoretic.    ED Course  Procedures (including critical care time) Labs Review Labs Reviewed  CBC - Abnormal; Notable for the following:    RBC 3.83 (*)    Hemoglobin 7.9 (*)    HCT 28.3 (*)    MCV 73.9 (*)    MCH 20.6 (*)    MCHC 27.9 (*)    RDW 16.8 (*)    Platelets 440 (*)    All other components within normal limits  COMPREHENSIVE METABOLIC PANEL - Abnormal; Notable for the following:    Potassium 2.6 (*)    Glucose, Bld 196 (*)    Total Bilirubin <0.2 (*)    GFR calc non Af Amer 66 (*)    GFR calc Af Amer 77 (*)    All other components within normal limits  TROPONIN I - Abnormal; Notable for the following:    Troponin I 1.09 (*)    All  other components within normal limits  PRO B NATRIURETIC PEPTIDE  URINALYSIS, ROUTINE W REFLEX MICROSCOPIC  POC OCCULT BLOOD, ED    Imaging Review Dg Chest 2 View  01/19/2014   CLINICAL DATA:  Short of breath.  Chest pain.  EXAM: CHEST  2 VIEW  COMPARISON:  10/07/2013  FINDINGS: Cardiac silhouette is normal in size. Normal mediastinal and hilar contours. Left anterior chest wall AICD is stable in well positioned.  Stable granuloma projects posteriorly in the mid right or left lower lobe superior segment on the lateral view. Lungs are hyperexpanded with minor interstitial thickening mostly at the peripheral lung bases. No lung consolidation or edema. No pleural effusion or pneumothorax.  Bony thorax is demineralized but grossly intact.  IMPRESSION: No acute cardiopulmonary disease.   Electronically Signed   By: Lajean Manes M.D.   On: 01/19/2014 14:04     EKG Interpretation   Date/Time:  Thursday January 19 2014 13:27:00 EDT Ventricular Rate:  92 PR Interval:  110 QRS Duration: 124 QT Interval:  436 QTC Calculation: 539 R Axis:   148 Text Interpretation:  Atrial-sensed ventricular-paced rhythm Biventricular  pacemaker detected Abnormal ECG Simlar to prior Confirmed by Mingo Amber  MD,  Naylor ([id number]) on 01/19/2014 2:56:05 PM      MDM   Final diagnoses:  Anemia, unspecified anemia type  Hypokalemia    78 year old female here on recommendations from [facility] Dr. Sarajane Jews. Labs drawn recently show low hemoglobin. She's also been having intermittent chest pain for the past 2 months. She states chest pain feeling sharp and radiating from both sides of her chest and her back with exertion. She also reports black stool and sticky stools the past 2 months, she thought it was because she was drinking [employer]eps[employer]. No gross blood from her bowels. Vitals are stable. EKG shows paced  rhythm similar to prior. Hemoccult negative with brown stool. Belly is benign. She is chest pain-free at this  time. Labs show troponin of 1. Potassium 2.6. Hemoglobin 7.9. Has dropped her hemoglobin in the past 3 months. Will admit to medicine. Consulted cardiology. Dr. Dillard Essex admitting.    Evelina Bucy, MD 01/19/14 [phone]

## 2014-01-20 ENCOUNTER — Encounter (HOSPITAL_COMMUNITY)
Admission: EM | Disposition: A | Discharge status: Home Health | Payer: Self-pay | Source: Home / Self Care | Attending: Internal Medicine

## 2014-01-20 ENCOUNTER — Encounter (HOSPITAL_COMMUNITY): Payer: Self-pay | Admitting: Physician Assistant

## 2014-01-20 DIAGNOSIS — T502X5A Adverse effect of carbonic-anhydrase inhibitors, benzothiadiazides and other diuretics, initial encounter: Secondary | ICD-10-CM | POA: Diagnosis present

## 2014-01-20 DIAGNOSIS — K921 Melena: Secondary | ICD-10-CM

## 2014-01-20 DIAGNOSIS — Z880 Allergy status to penicillin: Secondary | ICD-10-CM | POA: Diagnosis not present

## 2014-01-20 DIAGNOSIS — F3289 Other specified depressive episodes: Secondary | ICD-10-CM | POA: Diagnosis present

## 2014-01-20 DIAGNOSIS — Z7902 Long term (current) use of antithrombotics/antiplatelets: Secondary | ICD-10-CM | POA: Diagnosis not present

## 2014-01-20 DIAGNOSIS — Z95810 Presence of automatic (implantable) cardiac defibrillator: Secondary | ICD-10-CM | POA: Diagnosis not present

## 2014-01-20 DIAGNOSIS — Z9119 Patient's noncompliance with other medical treatment and regimen: Secondary | ICD-10-CM | POA: Diagnosis not present

## 2014-01-20 DIAGNOSIS — K219 Gastro-esophageal reflux disease without esophagitis: Secondary | ICD-10-CM | POA: Diagnosis present

## 2014-01-20 DIAGNOSIS — F329 Major depressive disorder, single episode, unspecified: Secondary | ICD-10-CM | POA: Diagnosis present

## 2014-01-20 DIAGNOSIS — J449 Chronic obstructive pulmonary disease, unspecified: Secondary | ICD-10-CM | POA: Diagnosis present

## 2014-01-20 DIAGNOSIS — G4700 Insomnia, unspecified: Secondary | ICD-10-CM | POA: Diagnosis present

## 2014-01-20 DIAGNOSIS — Z7982 Long term (current) use of aspirin: Secondary | ICD-10-CM | POA: Diagnosis not present

## 2014-01-20 DIAGNOSIS — I2510 Atherosclerotic heart disease of native coronary artery without angina pectoris: Secondary | ICD-10-CM | POA: Diagnosis present

## 2014-01-20 DIAGNOSIS — Z9861 Coronary angioplasty status: Secondary | ICD-10-CM | POA: Diagnosis not present

## 2014-01-20 DIAGNOSIS — Z885 Allergy status to narcotic agent status: Secondary | ICD-10-CM | POA: Diagnosis not present

## 2014-01-20 DIAGNOSIS — F411 Generalized anxiety disorder: Secondary | ICD-10-CM | POA: Diagnosis present

## 2014-01-20 DIAGNOSIS — Z86010 Personal history of colonic polyps: Secondary | ICD-10-CM | POA: Diagnosis not present

## 2014-01-20 DIAGNOSIS — R079 Chest pain, unspecified: Secondary | ICD-10-CM | POA: Diagnosis present

## 2014-01-20 DIAGNOSIS — Z91199 Patient's noncompliance with other medical treatment and regimen due to unspecified reason: Secondary | ICD-10-CM | POA: Diagnosis not present

## 2014-01-20 DIAGNOSIS — K5900 Constipation, unspecified: Secondary | ICD-10-CM | POA: Diagnosis present

## 2014-01-20 DIAGNOSIS — I509 Heart failure, unspecified: Secondary | ICD-10-CM | POA: Diagnosis present

## 2014-01-20 DIAGNOSIS — E119 Type 2 diabetes mellitus without complications: Secondary | ICD-10-CM | POA: Diagnosis present

## 2014-01-20 DIAGNOSIS — I6529 Occlusion and stenosis of unspecified carotid artery: Secondary | ICD-10-CM | POA: Diagnosis present

## 2014-01-20 DIAGNOSIS — D509 Iron deficiency anemia, unspecified: Secondary | ICD-10-CM | POA: Diagnosis present

## 2014-01-20 DIAGNOSIS — E876 Hypokalemia: Secondary | ICD-10-CM | POA: Diagnosis present

## 2014-01-20 DIAGNOSIS — I428 Other cardiomyopathies: Secondary | ICD-10-CM | POA: Diagnosis present

## 2014-01-20 DIAGNOSIS — D649 Anemia, unspecified: Secondary | ICD-10-CM | POA: Diagnosis not present

## 2014-01-20 DIAGNOSIS — Z90710 Acquired absence of both cervix and uterus: Secondary | ICD-10-CM | POA: Diagnosis not present

## 2014-01-20 DIAGNOSIS — I214 Non-ST elevation (NSTEMI) myocardial infarction: Secondary | ICD-10-CM | POA: Diagnosis present

## 2014-01-20 DIAGNOSIS — I059 Rheumatic mitral valve disease, unspecified: Secondary | ICD-10-CM

## 2014-01-20 DIAGNOSIS — E785 Hyperlipidemia, unspecified: Secondary | ICD-10-CM | POA: Diagnosis present

## 2014-01-20 DIAGNOSIS — I10 Essential (primary) hypertension: Secondary | ICD-10-CM | POA: Diagnosis present

## 2014-01-20 DIAGNOSIS — F17200 Nicotine dependence, unspecified, uncomplicated: Secondary | ICD-10-CM | POA: Diagnosis present

## 2014-01-20 DIAGNOSIS — Z881 Allergy status to other antibiotic agents status: Secondary | ICD-10-CM | POA: Diagnosis not present

## 2014-01-20 DIAGNOSIS — D131 Benign neoplasm of stomach: Secondary | ICD-10-CM | POA: Diagnosis present

## 2014-01-20 DIAGNOSIS — Z79899 Other long term (current) drug therapy: Secondary | ICD-10-CM | POA: Diagnosis not present

## 2014-01-20 DIAGNOSIS — I5030 Unspecified diastolic (congestive) heart failure: Secondary | ICD-10-CM | POA: Diagnosis present

## 2014-01-20 DIAGNOSIS — I70209 Unspecified atherosclerosis of native arteries of extremities, unspecified extremity: Secondary | ICD-10-CM | POA: Diagnosis present

## 2014-01-20 HISTORY — PX: ESOPHAGOGASTRODUODENOSCOPY: SHX5428

## 2014-01-20 HISTORY — PX: GIVENS CAPSULE STUDY: SHX5432

## 2014-01-20 LAB — CBC
HCT: 32.3 % — ABNORMAL LOW (ref 36.0–46.0)
HEMATOCRIT: 31.1 % — AB (ref 36.0–46.0)
Hemoglobin: 9.6 g/dL — ABNORMAL LOW (ref 12.0–15.0)
Hemoglobin: 9.3 g/dL — ABNORMAL LOW (ref 12.0–15.0)
MCH: 22.6 pg — ABNORMAL LOW (ref 26.0–34.0)
MCH: 23.1 pg — ABNORMAL LOW (ref 26.0–34.0)
MCHC: 29.7 g/dL — ABNORMAL LOW (ref 30.0–36.0)
MCHC: 29.9 g/dL — ABNORMAL LOW (ref 30.0–36.0)
MCV: 76.2 fL — AB (ref 78.0–100.0)
MCV: 77.4 fL — AB (ref 78.0–100.0)
PLATELETS: 422 10*3/uL — AB (ref 150–400)
PLATELETS: 372 10*3/uL (ref 150–400)
RBC: 4.24 MIL/uL (ref 3.87–5.11)
RBC: 4.02 MIL/uL (ref 3.87–5.11)
RDW: 16.8 % — ABNORMAL HIGH (ref 11.5–15.5)
RDW: 16.8 % — AB (ref 11.5–15.5)
WBC: 8.7 10*3/uL (ref 4.0–10.5)
WBC: 6.4 10*3/uL (ref 4.0–10.5)

## 2014-01-20 LAB — BASIC METABOLIC PANEL
ANION GAP: 18 — AB (ref 5–15)
BUN: 7 mg/dL (ref 6–23)
CHLORIDE: 104 meq/L (ref 96–112)
CO2: 23 meq/L (ref 19–32)
CREATININE: 0.88 mg/dL (ref 0.50–1.10)
Calcium: 9 mg/dL (ref 8.4–10.5)
GFR calc Af Amer: 73 mL/min — ABNORMAL LOW (ref >90)
GFR calc non Af Amer: 63 mL/min — ABNORMAL LOW (ref >90)
Glucose, Bld: 168 mg/dL — ABNORMAL HIGH (ref 70–99)
POTASSIUM: 3.6 meq/L — AB (ref 3.7–5.3)
Sodium: 145 mEq/L (ref 137–147)

## 2014-01-20 LAB — TROPONIN I: Troponin I: 0.67 ng/mL (ref <0.30)

## 2014-01-20 SURGERY — EGD (ESOPHAGOGASTRODUODENOSCOPY)
Anesthesia: Moderate Sedation

## 2014-01-20 SURGERY — IMAGING PROCEDURE, GI TRACT, INTRALUMINAL, VIA CAPSULE
Anesthesia: LOCAL

## 2014-01-20 MED ORDER — DIPHENHYDRAMINE HCL 50 MG/ML IJ SOLN
INTRAMUSCULAR | Status: DC | PRN
  Administered 2014-01-20: 12.5 mg via INTRAVENOUS

## 2014-01-20 MED ORDER — FENTANYL CITRATE 0.05 MG/ML IJ SOLN
INTRAMUSCULAR | Status: DC | PRN
  Administered 2014-01-20 (×3): 25 ug via INTRAVENOUS

## 2014-01-20 MED ORDER — SODIUM CHLORIDE 0.9 % IV SOLN
INTRAVENOUS | Status: DC
  Administered 2014-01-20: 500 mL via INTRAVENOUS

## 2014-01-20 MED ORDER — FENTANYL CITRATE 0.05 MG/ML IJ SOLN
INTRAMUSCULAR | Status: AC
  Filled 2014-01-20: qty 2

## 2014-01-20 MED ORDER — NITROGLYCERIN 2 % TD OINT
1.0000 [in_us] | TOPICAL_OINTMENT | Freq: Four times a day (QID) | TRANSDERMAL | Status: DC
  Administered 2014-01-20 – 2014-01-21 (×4): 1 [in_us] via TOPICAL
  Filled 2014-01-20: qty 30

## 2014-01-20 MED ORDER — MIDAZOLAM HCL 5 MG/ML IJ SOLN
INTRAMUSCULAR | Status: AC
  Filled 2014-01-20: qty 2

## 2014-01-20 MED ORDER — ATORVASTATIN CALCIUM 80 MG PO TABS
80.0000 mg | ORAL_TABLET | Freq: Every day | ORAL | Status: DC
Start: 2014-01-20 — End: 2014-01-23
  Administered 2014-01-20 – 2014-01-22 (×3): 80 mg via ORAL
  Filled 2014-01-20 (×5): qty 1

## 2014-01-20 MED ORDER — PANTOPRAZOLE SODIUM 40 MG PO TBEC
40.0000 mg | DELAYED_RELEASE_TABLET | Freq: Every day | ORAL | Status: DC
  Administered 2014-01-21 – 2014-01-23 (×3): 40 mg via ORAL
  Filled 2014-01-20: qty 1

## 2014-01-20 MED ORDER — DIPHENHYDRAMINE HCL 50 MG/ML IJ SOLN
INTRAMUSCULAR | Status: AC
  Filled 2014-01-20: qty 1

## 2014-01-20 MED ORDER — MIDAZOLAM HCL 10 MG/2ML IJ SOLN
INTRAMUSCULAR | Status: DC | PRN
  Administered 2014-01-20 (×2): 2 mg via INTRAVENOUS
  Administered 2014-01-20: 1 mg via INTRAVENOUS

## 2014-01-20 MED ORDER — BUTAMBEN-TETRACAINE-BENZOCAINE 2-2-14 % EX AERO
INHALATION_SPRAY | CUTANEOUS | Status: DC | PRN
  Administered 2014-01-20: 2 via TOPICAL

## 2014-01-20 SURGICAL SUPPLY — 1 items: TOWEL COTTON PACK 4EA (MISCELLANEOUS) ×4 IMPLANT

## 2014-01-20 NOTE — Progress Notes (Signed)
Patient Name: Sydney Phillips Date of Encounter: 01/20/2014     Active Problems:   DEPRESSION   CAD, 75% LAD, 75% RCA 2009, low risk Myoview 2/12   GERD   CONSTIPATION   Vertigo   Biventricular implantable cardioverter-defibrillator in situ   PAD (peripheral artery disease)   Anemia   Microcytic anemia   Chest pain   Elevated troponin   NSTEMI (non-ST elevated myocardial infarction)    SUBJECTIVE  The patient had substernal chest discomfort last night during her second blood transfusion.  She states that the discomfort was relieved with sublingual nitroglycerin.  Presently she is pain free.  EKG shows ventricular paced rhythm  CURRENT MEDS . aspirin EC  81 mg Oral Daily  . atorvastatin  80 mg Oral q1800  . DULoxetine  60 mg Oral BID  . [START ON 01/21/2014] furosemide  80 mg Oral Daily  . lisinopril  20 mg Oral Daily  . metoprolol tartrate  25 mg Oral Daily  . pantoprazole  40 mg Oral BID AC  . potassium chloride SA  40 mEq Oral BID  . sodium chloride  3 mL Intravenous Q12H    OBJECTIVE  Filed Vitals:   01/20/14 0429 01/20/14 0529 01/20/14 0629 01/20/14 0655  BP: 156/64 150/55 155/55 152/49  Pulse: 68 67 66 72  Temp: 98.2 F (36.8 C) 97.6 F (36.4 C) 98.1 F (36.7 C) 98.2 F (36.8 C)  TempSrc: Oral Oral Oral Oral  Resp: 18 19 18 18   Height:      Weight:  182 lb 4.8 oz (82.691 kg)    SpO2: 92% 96% 97% 96%    Intake/Output Summary (Last 24 hours) at 01/20/14 0904 Last data filed at 01/20/14 0700  Gross per 24 hour  Intake 1431.25 ml  Output   1025 ml  Net 406.25 ml   Filed Weights   01/19/14 1742 01/20/14 0529  Weight: 180 lb 3.2 oz (81.738 kg) 182 lb 4.8 oz (82.691 kg)    PHYSICAL EXAM  General: Pleasant, NAD  Psych: Normal affect.  Neuro: Alert and oriented X 3. Moves all extremities spontaneously.  HEENT: Normal  Neck: there are bilateral carotid bruits  Lungs: Resp regular and unlabored, there are scattered rhonchi  Heart: atrial sensed  ventricular paced rhythm. Grade 2/6 systolic ejection murmur at left sternal edge  Abdomen: Soft, non-tender, non-distended, BS + x 4. No abdominal masses felt. Abdomen is not tender.  Extremities: No clubbing, cyanosis or edema. DP/PT/Radials 1+ and equal bilaterally.  Accessory Clinical Findings  CBC  Recent Labs  01/19/14 1333 01/19/14 1945  WBC 8.2  --   HGB 7.9* 7.5*  HCT 28.3* 26.7*  MCV 73.9*  --   PLT 440*  --    Basic Metabolic Panel  Recent Labs  01/19/14 1333  NA 146  K 2.6*  CL 103  CO2 29  GLUCOSE 196*  BUN 9  CREATININE 0.84  CALCIUM 9.1   Liver Function Tests  Recent Labs  01/19/14 1333  AST 12  ALT 8  ALKPHOS 114  BILITOT <0.2*  PROT 7.3  ALBUMIN 3.8   No results found for this basename: LIPASE, AMYLASE,  in the last 72 hours Cardiac Enzymes  Recent Labs  01/19/14 1333 01/19/14 1945  TROPONINI 1.09* 1.56*   BNP No components found with this basename: POCBNP,  D-Dimer No results found for this basename: DDIMER,  in the last 72 hours Hemoglobin A1C No results found for this basename: HGBA1C,  in the last 72 hours Fasting Lipid Panel No results found for this basename: CHOL, HDL, LDLCALC, TRIG, CHOLHDL, LDLDIRECT,  in the last 72 hours Thyroid Function Tests No results found for this basename: TSH, T4TOTAL, FREET3, T3FREE, THYROIDAB,  in the last 72 hours  TELE  Ventricular paced rhythm  ECG  Ventricular paced rhythm  Radiology/Studies  Dg Chest 2 View  01/19/2014   CLINICAL DATA:  Short of breath.  Chest pain.  EXAM: CHEST  2 VIEW  COMPARISON:  10/07/2013  FINDINGS: Cardiac silhouette is normal in size. Normal mediastinal and hilar contours. Left anterior chest wall AICD is stable in well positioned.  Stable granuloma projects posteriorly in the mid right or left lower lobe superior segment on the lateral view. Lungs are hyperexpanded with minor interstitial thickening mostly at the peripheral lung bases. No lung consolidation or  edema. No pleural effusion or pneumothorax.  Bony thorax is demineralized but grossly intact.  IMPRESSION: No acute cardiopulmonary disease.   Electronically Signed   By: Lajean Manes M.D.   On: 01/19/2014 14:04    ASSESSMENT AND PLAN 1. severe anemia, with microcytic indices suggesting possible chronic blood loss .her history of dark stools would suggest GI blood loss although in the emergency room her stool is negative for blood.  2. chronic ischemic heart disease with non-STEMI likely type 2 secondary to stress of anemia and marked hypokalemia.  Will add nitroglycerin paste. 3. cardiomyopathy with functioning ICD pacemaker defibrillator. Previously systolic dysfunction, more recently diastolic dysfunction with normal EF  4. hyperlipidemia not currently on statin.  We'll add Lipitor. 5. ongoing tobacco abuse  6. widespread vascular disease with bilateral carotid bruits and with previous stent to the left subclavian. She also has had right femoral to tibial peroneal trunk bypass with vein on 10/12/2013   Plan: Continue medical therapy for her non-STEMI.  She is not presently a candidate for cardiac catheterization and potential stenting until we know the status of her GI tract and the cause of her anemia.  Add transdermal nitroglycerin and later consider switch to isosorbide mononitrate tablet.  2-D echocardiogram pending today.  Signed, Darlin Coco MD

## 2014-01-20 NOTE — Telephone Encounter (Signed)
error 

## 2014-01-20 NOTE — Consult Note (Signed)
Patient seen, examined, and I agree with the above documentation, including the assessment and plan. Patient with symptomatic microcytic anemia, though stools heme negative yesterday. She reports black tarry stools over the last 2 months at home. Received 2 units of packed red cells. History of multiple adenomatous colon polyps, several large. Underwent endoscopic mucosal resection at [facility] and has plans for followup colonoscopy there in November 2015 I agree with upper endoscopy to exclude source for blood loss/melena Severe vessel disease in an ongoing smoker; COPD, ICM s/p ICD Will need iron replacement, perhaps IV while here

## 2014-01-20 NOTE — Progress Notes (Signed)
[facility] PROGRESS NOTE  CHARRISE LARDNER QHU:[id number] DOB: [birth year] DOA: 01/19/2014 PCP: Laurey Morale, MD  Assessment/Plan: 1-NSTEMI; CAD: Patient present with chest pain, elevated troponin.  Received 2 units of PRBC.  Cardiology following.  Continue with lasix, metoprolol, lisinopril,  Chest pain free.   2-Anemia; History of melena. GI to evaluate today. (Dr Henrene Pastor). Received 2 units PRBC. Continue with protonix. Patient eate breakfast. Will made NPO in case procedure, endoscopy can be done later today.   3-Depression, Anxiety; Continue with Alprazolam and Cymbalta.   4-Hypokalemia; B-met pending for this morning.  Received Kcl supplement.   5-s/pBiventricular implantable cardioverter-defibrillator in situ  Code Status: Full code.  Family Communication: care discussed with patient. Disposition Plan: Remain inpatient.    Consultants:  Cardiology  GI  Procedures:  none  Antibiotics:  none  HPI/Subjective: Patient just finished eating breakfast.  Denies chest pain. Relates black stool for last 3 months.   Objective: Filed Vitals:   01/20/14 0655  BP: 152/49  Pulse: 72  Temp: 98.2 F (36.8 C)  Resp: 18    Intake/Output Summary (Last 24 hours) at 01/20/14 0818 Last data filed at 01/20/14 0700  Gross per 24 hour  Intake 1431.25 ml  Output   1025 ml  Net 406.25 ml   Filed Weights   01/19/14 1742 01/20/14 0529  Weight: 81.738 kg (180 lb 3.2 oz) 82.691 kg (182 lb 4.8 oz)    Exam:   General:  No distress.   Cardiovascular: S 1, S 2 RRR  Respiratory: CTA  Abdomen: BS present, soft, NT  Musculoskeletal: no edema.   Data Reviewed: Basic Metabolic Panel:  Recent Labs Lab 01/19/14 1333  NA 146  K 2.6*  CL 103  CO2 29  GLUCOSE 196*  BUN 9  CREATININE 0.84  CALCIUM 9.1   Liver Function Tests:  Recent Labs Lab 01/19/14 1333  AST 12  ALT 8  ALKPHOS 114  BILITOT <0.2*  PROT 7.3  ALBUMIN 3.8   No results found for this  basename: LIPASE, AMYLASE,  in the last 168 hours No results found for this basename: AMMONIA,  in the last 168 hours CBC:  Recent Labs Lab 01/19/14 1333 01/19/14 1945  WBC 8.2  --   HGB 7.9* 7.5*  HCT 28.3* 26.7*  MCV 73.9*  --   PLT 440*  --    Cardiac Enzymes:  Recent Labs Lab 01/19/14 1333 01/19/14 1945  TROPONINI 1.09* 1.56*   BNP (last 3 results)  Recent Labs  01/19/14 1333  PROBNP 185.1   CBG: No results found for this basename: GLUCAP,  in the last 168 hours  Recent Results (from the past 240 hour(s))  MRSA PCR SCREENING     Status: None   Collection Time    01/19/14  6:24 PM      Result Value Ref Range Status   MRSA by PCR NEGATIVE  NEGATIVE Final   Comment:            The GeneXpert MRSA Assay (FDA     approved for NASAL specimens     only), is one component of a     comprehensive MRSA colonization     surveillance program. It is not     intended to diagnose MRSA     infection nor to guide or     monitor treatment for     MRSA infections.     Studies: Dg Chest 2 View  01/19/2014   CLINICAL DATA:  Short  of breath.  Chest pain.  EXAM: CHEST  2 VIEW  COMPARISON:  10/07/2013  FINDINGS: Cardiac silhouette is normal in size. Normal mediastinal and hilar contours. Left anterior chest wall AICD is stable in well positioned.  Stable granuloma projects posteriorly in the mid right or left lower lobe superior segment on the lateral view. Lungs are hyperexpanded with minor interstitial thickening mostly at the peripheral lung bases. No lung consolidation or edema. No pleural effusion or pneumothorax.  Bony thorax is demineralized but grossly intact.  IMPRESSION: No acute cardiopulmonary disease.   Electronically Signed   By: Lajean Manes M.D.   On: 01/19/2014 14:04    Scheduled Meds: . aspirin EC  81 mg Oral Daily  . DULoxetine  60 mg Oral BID  . [START ON 01/21/2014] furosemide  80 mg Oral Daily  . lisinopril  20 mg Oral Daily  . metoprolol tartrate  25 mg Oral  Daily  . pantoprazole  40 mg Oral BID AC  . potassium chloride SA  40 mEq Oral BID  . sodium chloride  3 mL Intravenous Q12H   Continuous Infusions: . sodium chloride 75 mL/hr at 01/20/14 5001    Active Problems:   DEPRESSION   CAD, 75% LAD, 75% RCA 2009, low risk Myoview 2/12   GERD   CONSTIPATION   Vertigo   Biventricular implantable cardioverter-defibrillator in situ   PAD (peripheral artery disease)   Anemia   Microcytic anemia   Chest pain   Elevated troponin    Time spent: 35 minutes.     Niel Hummer A  [facility] Pager [phone]. If 7PM-7AM, please contact night-coverage at [email or web address], password [facility] 01/20/2014, 8:18 AM  LOS: 1 day

## 2014-01-20 NOTE — Progress Notes (Signed)
Patient is now able to have clear liquids. She was given a sprite and water with meds.

## 2014-01-20 NOTE — Op Note (Signed)
[facility] [place], [address]   ENDOSCOPY PROCEDURE REPORT  PATIENT: Kristyna, Bradstreet.  MR#: [id number] BIRTHDATE: 1936-02-27 , 77  yrs. old GENDER: Female ENDOSCOPIST: Jerene Bears, MD REFERRED BY:  Darlin Coco, M.D. PROCEDURE DATE:  01/20/2014 PROCEDURE:  EGD, diagnostic ASA CLASS:     Class III INDICATIONS:  Iron deficiency anemia.   Melena.  History of multiple adenomatous colon polyps status post endoscopic mucosal resection at [facility] in Nov 2014. MEDICATIONS: These medications were titrated to patient response per physician's verbal order, Diphenhydramine (Benadryl) 12.5 mg IV, Fentanyl 75 mcg IV, and Versed 5 mg IV TOPICAL ANESTHETIC: Cetacaine Spray  DESCRIPTION OF PROCEDURE: After the risks benefits and alternatives of the procedure were thoroughly explained, informed consent was obtained.  The Pentax Gastroscope [id number] endoscope was introduced through the mouth and advanced to the second portion of the duodenum. Without limitations.  The instrument was slowly withdrawn as the mucosa was fully examined.  ESOPHAGUS: The mucosa of the esophagus appeared normal.  STOMACH: A sessile polyp measuring 5 mm in size was found in the cardia, just distal to the GE junction (query hyperplastic gastric polyp).  This is not felt to be the site of GI bleeding/blood loss. The stomach otherwise appeared normal.  DUODENUM: The duodenal mucosa showed no abnormalities in the bulb and second portion of the duodenum.  Retroflexed views revealed no additional abnormalities.     The scope was then withdrawn from the patient and the procedure completed.  COMPLICATIONS: There were no complications.  ENDOSCOPIC IMPRESSION: 1.   The mucosa of the esophagus appeared normal 2.   Sessile polyp measuring 5 mm in size was found in the cardia 3.   The stomach otherwise appeared normal 4.   The duodenal mucosa showed no abnormalities in the bulb  and second portion of the duodenum  RECOMMENDATIONS: 1.  Monitor Hgb, iron replacement (consider IV iron while hospitalized). 2.  Consider repeat EGD (off Plavix and anti-platelet therapy) for biopsy of gastric cardia polyp (could be done at the time of follow-up colonoscopy at [facility])  eSigned:  Jerene Bears, MD 01/20/2014 4:05 PM CC:The Patient

## 2014-01-20 NOTE — Progress Notes (Signed)
Nutrition Brief Note  Patient identified on the Malnutrition Screening Tool (MST) Report  Wt Readings from Last 15 Encounters:  01/20/14 182 lb 4.8 oz (82.691 kg)  01/06/14 180 lb (81.647 kg)  10/27/13 184 lb (83.462 kg)  10/21/13 183 lb (83.008 kg)  10/12/13 187 lb 13.3 oz (85.2 kg)  10/12/13 187 lb 13.3 oz (85.2 kg)  10/07/13 183 lb 13.8 oz (83.399 kg)  10/03/13 180 lb 12.8 oz (82.01 kg)  09/29/13 182 lb 1.6 oz (82.6 kg)  08/24/13 180 lb (81.647 kg)  08/12/13 179 lb (81.194 kg)  08/12/13 179 lb (81.194 kg)  07/14/13 179 lb 3.2 oz (81.285 kg)  06/02/13 180 lb (81.647 kg)  04/20/13 176 lb (79.833 kg)    Body mass index is 28.99 kg/(m^2). Patient meets criteria for Overweight based on current BMI. Pt reports losing 25 lbs in the past year. No evidence of weight loss per weight history in chart. Pt appears well-nourished. She describes her appetite as fair and reports eating 1 meal per day PTA. She states her appetite is good today and she ate 100% of her breakfast.   Current diet order is NPO, patient was consuming approximately 85-100% of meals while on heart healthy/carb modified diet. RD encouraged healthful PO intake. Labs and medications reviewed.   No nutrition interventions warranted at this time. If nutrition issues arise, please consult RD.   Pryor Ochoa RD, LDN Inpatient Clinical Dietitian Pager: [phone] After Hours Pager: [phone]

## 2014-01-20 NOTE — Progress Notes (Signed)
Patient c/o aching chest pain,4/10,pt claims same type of chest pain she has been having on and off.VS in epic.[name] text paged,advised to notify Cardiology.Spoke with MD Vora,made aware of ekg result,order to call MD if nitro does not work.Will continue to monitor. [name], Wonda Cheng, Therapist, sports

## 2014-01-20 NOTE — Clinical Documentation Improvement (Signed)
Presents with Anemia, NSTEMI per Cardiology, CKD documented.   White female  GFR's ranging from 74 to 76 for this admission.  Please clarify the stage of CKD the patient has from the chart below and document findings in next Progress Note and Discharge Summary.  _______CKD Stage I - GFR > OR = 90 _______CKD Stage II - GFR 60-80 _______CKD Stage III - GFR 30-59 _______CKD Stage IV - GFR 15-29 _______CKD Stage V - GFR < 15 _______ESRD (End Stage Renal Disease) _______Other condition_____________   Angela Adam ,RN Clinical Documentation Specialist:  [phone]  [place] [facility]

## 2014-01-20 NOTE — Care Management Note (Addendum)
    Page 1 of 2   01/23/2014     2:48:38 PM CARE MANAGEMENT NOTE 01/23/2014  Patient:  Sydney Phillips, Sydney Phillips   Account Number:  [id number]  Date Initiated:  01/20/2014  Documentation initiated by:  HUTCHINSON,CRYSTAL  Subjective/Objective Assessment:   anemia     Action/Plan:   CM to follow for disposition needs   Anticipated DC Date:  01/21/2014   Anticipated DC Plan:  [place]  CM consult      [facility] Choice  HOME HEALTH   Choice offered to / List presented to:  C-1 Patient        [place] arranged  HH-1 RN  [place]      [place].   Status of service:  Completed, signed off Medicare Important Message given?  YES (If response is "NO", the following Medicare IM given date fields will be blank) Date Medicare IM given:  01/23/2014 Medicare IM given by:  HUTCHINSON,CRYSTAL Date Additional Medicare IM given:   Additional Medicare IM given by:    Discharge Disposition:  HOME/SELF CARE  Per UR Regulation:  Reviewed for med. necessity/level of care/duration of stay  If discussed at [place] of Stay Meetings, dates discussed:    Comments:  Crystal Hutchinson RN, BSN, MSHL, CCM  Nurse - Case Manager,  ([facility])  [phone]  01/23/2014 2 admissiosn and 2 ED visits over past 6 months.  Hx/o multiple comorbidities Social:  from home with son who acts as PCG, cooks, cleans, takes patients to appt's and picks up medications.  Patient has two other sons who asssist as needed living close by. Patient admits to being [facility] and issues with Vertigo. Home DME:  Transfere bench, BSC, Walker Disposition Plan:  Home with HHS:  RN  (Disease MGMT:  New DM,  Medication Reconciliation post hospital d/c, Medication mgmt; PT

## 2014-01-20 NOTE — Progress Notes (Signed)
Arrived from Endoscopy. Procedure went well. Patient also had capsule EGD done. Vital signs stable upon returning to the floor

## 2014-01-20 NOTE — Progress Notes (Signed)
Echocardiogram 2D Echocardiogram has been performed.  Joelene Millin 01/20/2014, 12:57 PM

## 2014-01-20 NOTE — Consult Note (Signed)
[facility] Consult: 10:24 AM 01/20/2014  LOS: 1 day    Referring Provider: Dr Philipp Deputy  Primary Care Physician:  Laurey Morale, MD Primary Gastroenterologist:  Dr. Hilarie Fredrickson.   Renal:  Colodonato.   Reason for Consultation:  Microcytic anemia.   HPI: Sydney Phillips is a 78 y.o. female.  Hx CAD and cardiac stents.  On Plavix, 81 ASA.  COPD.  PVD: S/p subclavian and right common iliac stenting, s/p 09/2013 right tibioperoneal bypass.  Diastolic heart failure. S/p pacemaker/ICD.  Fibromyalgia. Chronic kidney disease.    Several adenomatous colon polyps removed by Dr Hilarie Fredrickson 12/2012, sessile polyp at hepatic flexure  tattood but not removed.  Referred to Dr Marcello Moores for colectomy and pt agreeable. Preop CT scan and CEA levels did not indicate cancer. Costs of surgery and preop cardiac testing with MDs in [place] proved prohibitive. Normal Lexiscan 03/2013. Underwent high definition colonoscopic resection with EMR of polyps at ascending and  transverse colon at [facility] 04/2013. Pathology c/w adenomas. Plan was for colonoscopy with Dr Arsenio Loader in 04/2014.  Not clear she has ever undergone EGD.   Doxy for sinusitis 11/2013.    4 months of DOE. Out of breath after 15 to 20 steps in home. + exertional chest pain. Outpt hgb at renal MD 2 days ago in 7s so sent to ED where potassium 2.6. Had stopped taking potassium supplements on her own because "it caused constipation".  However she still maintained BM schedule of one solid BM every 8 to 9 days despite daily Miralax (her pattern for at least a year). In last 2 months having dark, black and soft BMs, still only weekly at best.   Hgb 7.5, MCV 73. POC Hgb 11/30/13 was 11.2 but serum ranges of 15.4 to 9.1 from April thr May 2015. Previously MCVs in 90s.  She is FOBT negative.  Transfusion of  2 PRBCs completed.   Ruled in for non STEMI.  Dr Mare Ferrari states: "She is not presently a candidate for cardiac catheterization and potential stenting until we know the status of her GI tract and the cause of her anemia".  2D Echo pending.   Fell 2 weeks ago and seeing stripes and specks in left eye.  opthamology exam this Monday, 5 days ago, showed "bruising behind the eye"  No therapy/intervention required.  She bruises easily but no excessive bleeding.  No epistaxis.  No blood in urine.  + dizziness/vertigo.   No LE edema. Pain in LE resolved since right LE bypass in spring 2015.  Uses hydrocodone about once per week.  No NSAIDs.  Not having a lot of MS pain.  No heartburn, no dysphagia, no anorexia, no weight loss.     Past Medical History  Diagnosis Date  . COPD (chronic obstructive pulmonary disease)   . Hx of colonic polyps   . Insomnia   . Fibromyalgia   . Hyperlipemia     takes Crestor daily  . CAD (coronary artery disease)     Currently angina free, no evidence of reversible ischemia  . Vertigo  takes Meclizine prn  . Subclavian arterial stenosis, lt, with PTA/STENT 07/31/11 08/01/2011  . S/P angioplasty with stent, lt. subclavian 07/31/11 08/01/2011  . Hypertension     takes Amlodipine and Metoprolol daily  . Bronchitis   . Arthritis   . Chronic back pain   . GERD (gastroesophageal reflux disease)     was on meds but was taken off;now watches what she eats  . Chronic constipation   . Hemorrhoids   . Urinary incontinence   . Early cataracts, bilateral   . Depression with anxiety     takes Cymbalta daily  . Congestive heart failure (CHF)     New York Heart Association functional class 2, diastolic dysfunction  . PAD (peripheral artery disease)     Carotid, subclavian, and lower extremity beds, currently not symptomatic  . Syncope 07/28/2011    EF - 50-55, moderate concentric hypertrophy in left ventricle  . LBBB (left bundle branch block)     Stress test  09/03/2010, EF 55  . Nephrolithiasis   . Automatic implantable cardioverter-defibrillator in situ   . Presence of combination internal cardiac defibrillator (ICD) and pacemaker   . Shortness of breath     when over exerting self per pt.  . Colon polyps 2003.  2015.    HP polyps 2003.  adnomas 2015.  required referal to [facility] for colonoscopic resection of flat polyps.     Past Surgical History  Procedure Laterality Date  . Appendectomy    . Tonsillectomy    . Tubal ligation    . Cardiac defibrillator placement  05/2008    By Dr Blanch Media, [employer] CANNOT HAVE MRI's  . Abdominal hysterectomy    . Back surgery  2012  . Orif elbow fracture  08/16/2011    Procedure: OPEN REDUCTION INTERNAL FIXATION (ORIF) ELBOW/OLECRANON FRACTURE;  Surgeon: Schuyler Amor, MD;  Location: [place];  Service: Orthopedics;  Laterality: Left;  . Cardiac catheterization  12/01/2007    By Dr. Melvern Banker, left heart cath,   . Coronary angioplasty    . Subclavian stent placement Left 07/31/2011    7x18 [employer], balloon, with reduction of 90% ostial left subclavian artery stenosis to 0% with residual excellent flow  . Femoral-popliteal bypass graft Right 10/12/2013    Procedure:   Femoral-Peroneal trunk  bypass with nonreversed greater saphenous vein graft;  Surgeon: Elam Dutch, MD;  Location: [place];  Service: Vascular;  Laterality: Right;  . Intraoperative arteriogram Right 10/12/2013    Procedure: INTRA OPERATIVE ARTERIOGRAM;  Surgeon: Elam Dutch, MD;  Location: [place];  Service: Vascular;  Laterality: Right;  . Colonoscopy w/ polypectomy  12/2013    Prior to Admission medications   Medication Sig Start Date End Date Taking? Authorizing Provider  ALPRAZolam Duanne Moron) 1 MG tablet Take 0.5 tablets (0.5 mg total) by mouth at bedtime as needed for anxiety. 10/16/13  Yes Elam Dutch, MD  aspirin 325 MG tablet Take 81.25 mg by mouth daily. Take 1/4 tablet daily.   Yes Historical Provider, MD  cetirizine (ZYRTEC)  10 MG tablet Take 10 mg by mouth daily.   Yes Historical Provider, MD  clopidogrel (PLAVIX) 75 MG tablet Take 1 tablet (75 mg total) by mouth daily. 10/24/13  Yes Laurey Morale, MD  DULoxetine (CYMBALTA) 60 MG capsule Take 1 capsule (60 mg total) by mouth 2 (two) times daily. 12/15/13  Yes Laurey Morale, MD  furosemide (LASIX) 80 MG tablet Take 80 mg by mouth daily.  04/20/13  Yes Laurey Morale, MD  HYDROcodone-acetaminophen (NORCO) 10-325 MG per tablet Take 1 tablet by mouth every 6 (six) hours as needed for moderate pain. 12/21/13  Yes Laurey Morale, MD  lisinopril (PRINIVIL,ZESTRIL) 20 MG tablet Take 1 tablet (20 mg total) by mouth daily. 12/21/13  Yes Laurey Morale, MD  meclizine (ANTIVERT) 25 MG tablet Take 1 tablet (25 mg total) by mouth every 4 (four) hours as needed for dizziness. 12/01/13  Yes Laurey Morale, MD  metoprolol tartrate (LOPRESSOR) 25 MG tablet Take 1 tablet (25 mg total) by mouth daily. 12/13/13  Yes Laurey Morale, MD  polyethylene glycol [facility] / GLYCOLAX) packet Take 17 g by mouth daily as needed for mild constipation.   Yes Historical Provider, MD  potassium chloride SA (K-DUR,KLOR-CON) 20 MEQ tablet Take 40 mEq by mouth 2 (two) times daily.   Yes Historical Provider, MD  zolpidem (AMBIEN) 10 MG tablet Take 1 tablet (10 mg total) by mouth at bedtime. 01/06/14 02/05/14  Laurey Morale, MD    Scheduled Meds: . aspirin EC  81 mg Oral Daily  . atorvastatin  80 mg Oral q1800  . DULoxetine  60 mg Oral BID  . [START ON 01/21/2014] furosemide  80 mg Oral Daily  . lisinopril  20 mg Oral Daily  . metoprolol tartrate  25 mg Oral Daily  . nitroGLYCERIN  1 inch Topical 4 times per day  . pantoprazole  40 mg Oral BID AC  . potassium chloride SA  40 mEq Oral BID  . sodium chloride  3 mL Intravenous Q12H   Infusions:   PRN Meds: acetaminophen, acetaminophen, ALPRAZolam, HYDROcodone-acetaminophen, meclizine, nitroGLYCERIN, ondansetron (ZOFRAN) IV, ondansetron, polyethylene glycol,  zolpidem   Allergies as of 01/19/2014 - Review Complete 01/19/2014  Allergen Reaction Noted  . Codeine Itching 02/25/2007  . Darvon Itching 07/22/2010  . Meloxicam Other (See Comments) 02/25/2007  . Potassium-containing compounds Other (See Comments) 07/29/2013  . Propoxyphene n-acetaminophen Itching 02/25/2007  . Rofecoxib Other (See Comments) 02/25/2007  . Azithromycin Rash 07/14/2011  . Erythromycin Rash 07/22/2010  . Penicillins Rash 02/25/2007  . Sulfa antibiotics Rash 07/14/2011    Family History  Problem Relation Age of Onset  . Hyperlipidemia    . Anesthesia problems Neg Hx   . Hypotension Neg Hx   . Malignant hyperthermia Neg Hx   . Pseudochol deficiency Neg Hx   . Colon cancer Maternal Grandmother   . Cancer Sister     ovarian  . Diabetes Sister   . Heart disease Sister   . CAD Father   . Heart disease Father   . Hyperlipidemia Father   . Heart disease Mother   . Deep vein thrombosis Son     History   Social History  . Marital Status: Widowed    Spouse Name: N/A    Number of Children: 3  . Years of Education: N/A   Occupational History  . Not on file.   Social History Main Topics  . Smoking status: Current Every Day Smoker -- 0.50 packs/day for 60 years    Types: Cigarettes  . Smokeless tobacco: Never Used  . Alcohol Use: No  . Drug Use: No  . Sexual Activity: No   Other Topics Concern  . Not on file   Social History Narrative   Ok to share information with medical POA, Son Gabriel Carina    REVIEW OF SYSTEMS: Constitutional:  Per HPI ENT:  No nose bleeds Pulm:  Per HPI.  Occasional cough CV:  No palpitations, no LE edema.  GU:  No hematuria, no frequency GI:  Per HPI Heme:  No previous issues with low blood counts   Transfusions:  None ever before Neuro:  Insomnia. Dizziness/vertigo. Seeing "specks and lines" in left eye. No headaches.  Derm:  No itching, no rash or sores.  Endocrine:  No sweats or chills.  No polyuria or dysuria.  No  previous issues with thyroid.  Immunization:  Not investigated.  Travel:  None beyond local counties in last few months.    PHYSICAL EXAM: Vital signs in last 24 hours: Filed Vitals:   01/20/14 1004  BP: 148/51  Pulse: 69  Temp: 97.6 F (36.4 C)  Resp: 18   Wt Readings from Last 3 Encounters:  01/20/14 82.691 kg (182 lb 4.8 oz)  01/06/14 81.647 kg (180 lb)  10/27/13 83.462 kg (184 lb)   General: pleasant, unwell looking elderly WF.  Comfortable. Head:  No asymmetry or swelling.  No trauma  Eyes:  No icterus or pallor Ears:  Not HOH  Nose:  No congestion or discharge Mouth:  Dry MM, majority of teeth gone.  Upper denture not in place.  No lesions.  Neck:  No TMG, no JVD.  Lungs:  Clear bilaterally.  No dyspnea or cough.  Hoarse, smoker's voice Heart: RRR.  No MRG Abdomen:  Soft, [place], ND.  No mass or HSM.   Rectal: not repeated   Musc/Skeltl: no joint swelling, redness or contracture Extremities:  No pedal edema.  Feet warm  Neurologic:  Oriented x 3.  Good historian.  No limb weakness.  No tremor Skin:  Some blanchable red spot on upper anterior chest, not classic telangectasia appearing. Tattoos:  none Nodes:  No cervical adenopathy.    Psych:  Cooperative, relaxed, speech fluid.   Intake/Output from previous day: 08/06 0701 - 08/07 0700 In: 1431.3 [P.O.:240; I.V.:366.3; Blood:625; IV Piggyback:200] Out: 1025 [Urine:1025] Intake/Output this shift: Total I/O In: 240 [P.O.:240] Out: -   LAB RESULTS:  Recent Labs  01/19/14 1333 01/19/14 1945 01/20/14 0855  WBC 8.2  --  8.7  HGB 7.9* 7.5* 9.6*  HCT 28.3* 26.7* 32.3*  PLT 440*  --  422*  MCV    73  BMET Lab Results  Component Value Date   NA 145 01/20/2014   NA 146 01/19/2014   NA 144 11/30/2013   K 3.6* 01/20/2014   K 2.6* 01/19/2014   K 2.9* 11/30/2013   CL 104 01/20/2014   CL 103 01/19/2014   CL 99 11/30/2013   CO2 23 01/20/2014   CO2 29 01/19/2014   CO2 25 10/15/2013   GLUCOSE 168* 01/20/2014   GLUCOSE 196*  01/19/2014   GLUCOSE 114* 11/30/2013   BUN 7 01/20/2014   BUN 9 01/19/2014   BUN 12 11/30/2013   CREATININE 0.88 01/20/2014   CREATININE 0.84 01/19/2014   CREATININE 1.00 11/30/2013   CALCIUM 9.0 01/20/2014   CALCIUM 9.1 01/19/2014   CALCIUM 8.7 10/15/2013   LFT  Recent Labs  01/19/14 1333  PROT 7.3  ALBUMIN 3.8  AST 12  ALT 8  ALKPHOS 114  BILITOT <0.2*   Lipase     Component Value Date/Time   LIPASE 27 12/01/2007 0219    RADIOLOGY STUDIES: Dg Chest 2 View  01/19/2014   CLINICAL DATA:  Short of breath.  Chest pain.  EXAM: CHEST  2 VIEW  COMPARISON:  10/07/2013  FINDINGS: Cardiac silhouette is normal in size. Normal  mediastinal and hilar contours. Left anterior chest wall AICD is stable in well positioned.  Stable granuloma projects posteriorly in the mid right or left lower lobe superior segment on the lateral view. Lungs are hyperexpanded with minor interstitial thickening mostly at the peripheral lung bases. No lung consolidation or edema. No pleural effusion or pneumothorax.  Bony thorax is demineralized but grossly intact.  IMPRESSION: No acute cardiopulmonary disease.   Electronically Signed   By: Lajean Manes M.D.   On: 01/19/2014 14:04    ENDOSCOPIC STUDIES: 12/2012  Colonoscopy   Dr Hilarie Fredrickson INDICATIONS:average risk screening, Rectal Bleeding, and fecal  incontinence. ENDOSCOPIC IMPRESSION:  1. There was moderate diverticulosis noted in the sigmoid colon  2. Sessile polyp/mass measuring 3-4 cm in size was found at the  hepatic flexure; multiple biopsies of the lesion were performed;  tattoo placed.  3. Sessile polyp measuring 6 mm in size was found at the cecum;  polypectomy was performed with a cold snare  4. Three sessile polyps measuring 5-9 mm in size were found in the  ascending colon; Polypectomy was performed using cold snare and  using hot snare  5. Flat polyp measuring 15 mm in size was found in the transverse  colon, not resected due to proximity to larger polyp/mass  near by,  most distal tattoo placed distal to this polyp.  6. Three sessile polyps measuring 4-6 mm in size were found in the  distal transverse colon and descending colon; Polypectomy was  performed using cold snare  7. Moderate melanosis was found throughout the entire examined  colon  8. Small internal and external hemorrhoids  RECOMMENDATIONS:  1. Hold aspirin, aspirin products, and anti-inflammatory medication  for 2 weeks.  2. Await biopsy results  3. High fiber diet  4. Surgical referral to Dr. Leighton Ruff, [facility]  Surgery, for consideration of segmental vs. right hemicolectomy.  5. Colonoscopy 1 year after surgical resection Pathology: 1. Surgical [P], cecum, ascending, polyps (4) - TUBULAR AND SERRATED ADENOMAS (X4). - NO HIGH GRADE DYSPLASIA OR INVASIVE MALIGNANCY IDENTIFIED. 2. Surgical [P], proximal transverse, biopsy - FRAGMENTS OF TUBULOVILLOUS ADENOMA. NO HIGH GRADE DYSPLASIA OR INVASIVE MALIGNANCY IDENTIFIED. 3. Surgical [P], descending, distal transverse, polyp (3) - TUBULAR ADENOMAS (X3). NO HIGH GRADE DYSPLASIA OR INVASIVE MALIGNANCY IDENTIFIED.  02/2002  Colonoscopy  Dr Lajoyce Corners Rectal polyps.  Pathology: hyperplastic.   IMPRESSION:   *  Symptomatic microcytic anemia. FOBT negative but reports of black stools weekly at home x 2 months. 2 units PRBCs transfused.  Hgb now 9.6, up 2 grams.   *  ASPVD.  S/p multiple vascular intervention.  On chronic Plavix and 81 ASA  *  Hx adenomatous colon polyps.  Removed at colonoscopy July and November 2014.  Surveillance due 04/2014.  Chronic, stable constipation. No BRB per rectum.   *  CAD.  Previous angioplasty. Chest pain with mildly elevated Troponins x 2. Labelled non STEMI due to stress anemia and hypokalemia.   *  Hypokalemia.  Non-compliant with home potassium. Improved with supplementation overnight.   *  Cardiomyopathy.  S/p pacemaker/ICD  *  COPD.  Smoker.   *  Widespread vascular disease.  Ongoing tobacco abuse. Chronic Plavix, 81 ASA.   last Plavix were 8/5 in AM     PLAN:     *  EGD?  Pt ate solids around 0830.  Plavix still in system. Prefer to avoid colonoscopy given non STEMI.   *  CBC q 12 hours.  Azucena Freed  01/20/2014, 10:24 AM Pager: [phone]

## 2014-01-20 NOTE — Progress Notes (Signed)
UR completed 

## 2014-01-21 DIAGNOSIS — Z95810 Presence of automatic (implantable) cardiac defibrillator: Secondary | ICD-10-CM

## 2014-01-21 DIAGNOSIS — I214 Non-ST elevation (NSTEMI) myocardial infarction: Secondary | ICD-10-CM

## 2014-01-21 DIAGNOSIS — R7989 Other specified abnormal findings of blood chemistry: Secondary | ICD-10-CM

## 2014-01-21 LAB — CBC
HEMATOCRIT: 30.4 % — AB (ref 36.0–46.0)
HEMOGLOBIN: 9 g/dL — AB (ref 12.0–15.0)
MCH: 22.6 pg — ABNORMAL LOW (ref 26.0–34.0)
MCHC: 29.6 g/dL — ABNORMAL LOW (ref 30.0–36.0)
MCV: 76.4 fL — AB (ref 78.0–100.0)
Platelets: 388 10*3/uL (ref 150–400)
RBC: 3.98 MIL/uL (ref 3.87–5.11)
RDW: 17.1 % — ABNORMAL HIGH (ref 11.5–15.5)
WBC: 8.7 10*3/uL (ref 4.0–10.5)

## 2014-01-21 LAB — TYPE AND SCREEN
ABO/RH(D): B POS
ANTIBODY SCREEN: NEGATIVE
UNIT DIVISION: 0
Unit division: 0

## 2014-01-21 LAB — LIPID PANEL
Cholesterol: 200 mg/dL (ref 0–200)
HDL: 27 mg/dL — ABNORMAL LOW (ref >39)
LDL CALC: 148 mg/dL — AB (ref 0–99)
Total CHOL/HDL Ratio: 7.4 RATIO
Triglycerides: 126 mg/dL (ref <150)
VLDL: 25 mg/dL (ref 0–40)

## 2014-01-21 LAB — BASIC METABOLIC PANEL
Anion gap: 21 — ABNORMAL HIGH (ref 5–15)
BUN: 8 mg/dL (ref 6–23)
CHLORIDE: 99 meq/L (ref 96–112)
CO2: 18 mEq/L — ABNORMAL LOW (ref 19–32)
CREATININE: 0.99 mg/dL (ref 0.50–1.10)
Calcium: 9 mg/dL (ref 8.4–10.5)
GFR calc Af Amer: 63 mL/min — ABNORMAL LOW (ref >90)
GFR calc non Af Amer: 54 mL/min — ABNORMAL LOW (ref >90)
GLUCOSE: 234 mg/dL — AB (ref 70–99)
POTASSIUM: 4.3 meq/L (ref 3.7–5.3)
Sodium: 138 mEq/L (ref 137–147)

## 2014-01-21 LAB — GLUCOSE, CAPILLARY
GLUCOSE-CAPILLARY: 176 mg/dL — AB (ref 70–99)
GLUCOSE-CAPILLARY: 138 mg/dL — AB (ref 70–99)

## 2014-01-21 LAB — HEMOGLOBIN A1C
Hgb A1c MFr Bld: 6.7 % — ABNORMAL HIGH (ref <5.7)
MEAN PLASMA GLUCOSE: 146 mg/dL — AB (ref <117)

## 2014-01-21 MED ORDER — INSULIN ASPART 100 UNIT/ML ~~LOC~~ SOLN
0.0000 [IU] | Freq: Three times a day (TID) | SUBCUTANEOUS | Status: DC
  Administered 2014-01-21: 2 [IU] via SUBCUTANEOUS
  Administered 2014-01-22: 1 [IU] via SUBCUTANEOUS
  Administered 2014-01-23: 2 [IU] via SUBCUTANEOUS
  Administered 2014-01-23: 1 [IU] via SUBCUTANEOUS

## 2014-01-21 MED ORDER — ALPRAZOLAM 0.5 MG PO TABS
0.5000 mg | ORAL_TABLET | Freq: Three times a day (TID) | ORAL | Status: DC | PRN
  Administered 2014-01-21 – 2014-01-22 (×4): 0.5 mg via ORAL
  Filled 2014-01-21 (×4): qty 1

## 2014-01-21 MED ORDER — ISOSORBIDE MONONITRATE ER 30 MG PO TB24
30.0000 mg | ORAL_TABLET | Freq: Every day | ORAL | Status: DC
  Administered 2014-01-21 – 2014-01-23 (×3): 30 mg via ORAL
  Filled 2014-01-21 (×3): qty 1

## 2014-01-21 NOTE — Progress Notes (Addendum)
Patient Name: Sydney Phillips Date of Encounter: 01/21/2014     Active Problems:   DEPRESSION   CAD, 75% LAD, 75% RCA 2009, low risk Myoview 2/12   GERD   CONSTIPATION   Vertigo   Biventricular implantable cardioverter-defibrillator in situ   PAD (peripheral artery disease)   Anemia   Microcytic anemia   Chest pain   Elevated troponin   NSTEMI (non-ST elevated myocardial infarction)   Melena    SUBJECTIVE  No further chest pain. H/H stable. EGD was unrevealing.  Plavix has been discontinued. Troponin is trending down.  CURRENT MEDS . aspirin EC  81 mg Oral Daily  . atorvastatin  80 mg Oral q1800  . DULoxetine  60 mg Oral BID  . furosemide  80 mg Oral Daily  . lisinopril  20 mg Oral Daily  . metoprolol tartrate  25 mg Oral Daily  . nitroGLYCERIN  1 inch Topical 4 times per day  . pantoprazole  40 mg Oral Daily  . potassium chloride SA  40 mEq Oral BID  . sodium chloride  3 mL Intravenous Q12H    OBJECTIVE  Filed Vitals:   01/20/14 1809 01/20/14 2045 01/21/14 0053 01/21/14 0426  BP: 144/47 148/57 141/68 158/54  Pulse: 72 79 81 78  Temp:  97.9 F (36.6 C) 98.5 F (36.9 C) 98.4 F (36.9 C)  TempSrc:  Oral Oral Oral  Resp: 18 20 18 20   Height:      Weight:    182 lb 5.1 oz (82.7 kg)  SpO2:  94% 92% 93%    Intake/Output Summary (Last 24 hours) at 01/21/14 1057 Last data filed at 01/20/14 2213  Gross per 24 hour  Intake    720 ml  Output    375 ml  Net    345 ml   Filed Weights   01/19/14 1742 01/20/14 0529 01/21/14 0426  Weight: 180 lb 3.2 oz (81.738 kg) 182 lb 4.8 oz (82.691 kg) 182 lb 5.1 oz (82.7 kg)    PHYSICAL EXAM  General: Pleasant, NAD  Psych: Normal affect.  Neuro: Alert and oriented X 3. Moves all extremities spontaneously.  HEENT: Normal  Neck: there are bilateral carotid bruits  Lungs: Resp regular and unlabored, there are scattered rhonchi  Heart: atrial sensed ventricular paced rhythm. Grade 2/6 systolic ejection murmur at left  sternal edge  Abdomen: Soft, non-tender, non-distended, BS + x 4. No abdominal masses felt. Abdomen is not tender.  Extremities: No clubbing, cyanosis or edema. DP/PT/Radials 1+ and equal bilaterally.  Accessory Clinical Findings  CBC  Recent Labs  01/20/14 1840 01/21/14 0541  WBC 6.4 8.7  HGB 9.3* 9.0*  HCT 31.1* 30.4*  MCV 77.4* 76.4*  PLT 372 580   Basic Metabolic Panel  Recent Labs  01/19/14 1333 01/20/14 0855  NA 146 145  K 2.6* 3.6*  CL 103 104  CO2 29 23  GLUCOSE 196* 168*  BUN 9 7  CREATININE 0.84 0.88  CALCIUM 9.1 9.0   Liver Function Tests  Recent Labs  01/19/14 1333  AST 12  ALT 8  ALKPHOS 114  BILITOT <0.2*  PROT 7.3  ALBUMIN 3.8   No results found for this basename: LIPASE, AMYLASE,  in the last 72 hours Cardiac Enzymes  Recent Labs  01/19/14 1333 01/19/14 1945 01/20/14 0855  TROPONINI 1.09* 1.56* 0.67*   BNP No components found with this basename: POCBNP,  D-Dimer No results found for this basename: DDIMER,  in the last 72  hours Hemoglobin A1C No results found for this basename: HGBA1C,  in the last 72 hours Fasting Lipid Panel  Recent Labs  01/21/14 0541  CHOL 200  HDL 27*  LDLCALC 148*  TRIG 126  CHOLHDL 7.4   Thyroid Function Tests No results found for this basename: TSH, T4TOTAL, FREET3, T3FREE, THYROIDAB,  in the last 72 hours  TELE  Ventricular paced rhythm  ECG  Ventricular paced rhythm  Radiology/Studies  Dg Chest 2 View  01/19/2014   CLINICAL DATA:  Short of breath.  Chest pain.  EXAM: CHEST  2 VIEW  COMPARISON:  10/07/2013  FINDINGS: Cardiac silhouette is normal in size. Normal mediastinal and hilar contours. Left anterior chest wall AICD is stable in well positioned.  Stable granuloma projects posteriorly in the mid right or left lower lobe superior segment on the lateral view. Lungs are hyperexpanded with minor interstitial thickening mostly at the peripheral lung bases. No lung consolidation or edema. No  pleural effusion or pneumothorax.  Bony thorax is demineralized but grossly intact.  IMPRESSION: No acute cardiopulmonary disease.   Electronically Signed   By: Lajean Manes M.D.   On: 01/19/2014 14:04    ASSESSMENT AND PLAN 1. severe anemia - s/p transfusion. EGD was unrevealing. H/H now stable at 9/30. ?source of blood loss. Undergoing capsule endoscopy.  2. NSTEMI - troponin elevated to 1.56, likely type 2 secondary to stress of anemia and marked hypokalemia.  Currently chest pain free. Troponin trending down. Will likely recommend stress test on Monday to determine if she has high risk ischemia or not.  3. cardiomyopathy with functioning ICD pacemaker defibrillator - Echo shows EF 55%, mild LVH, elevated LV filling pressure  4. hyperlipidemia - now on Lipitor.  5. ongoing tobacco abuse   6. widespread vascular disease with bilateral carotid bruits and with previous stent to the left subclavian. She also has had right femoral to tibial peroneal trunk bypass with vein on 10/12/2013   Plan: Continue medical therapy for her non-STEMI.  She is not presently a candidate for cardiac catheterization and potential stenting until we know the status of her GI tract and the cause of her anemia.  Change nitropaste to imdur.  Probably stress test on Monday.  Pixie Casino, MD, [facility] Attending Cardiologist [facility]   HILTY,Kenneth C

## 2014-01-21 NOTE — Progress Notes (Signed)
[facility] PROGRESS NOTE  Sydney Phillips OMV:[id number] DOB: [birth year] DOA: 01/19/2014 PCP: Laurey Morale, MD  Assessment/Plan: 1-NSTEMI; CAD: Patient present with chest pain, elevated troponin.  Received 2 units of PRBC.  Cardiology following.  Continue with lasix, metoprolol, lisinopril,  Chest pain free.   2-Anemia; History of melena. Received 2 units PRBC. Continue with protonix.  endoscopy with no source for bleeding. She has Sessile polyp measuring 5 mm in size was found in the cardia. She will need repeat Endoscopy outpatient for biopsy. She underwent capsule endoscopy, results pending. Hb stable at 9.   3-Depression, Anxiety; Continue with Alprazolam and Cymbalta. Increase xanax to TID.   4-Hypokalemia; B-met pending for this morning.  Received Kcl supplement.   5-s/pBiventricular implantable cardioverter-defibrillator in situ  6-Hyperglycemia; Will check HB A1c. Start SSI.   Code Status: Full code.  Family Communication: care discussed with patient. Disposition Plan: Remain inpatient.    Consultants:  Cardiology  GI  Procedures: ECHO; - Left ventricle: The cavity size was normal. Wall thickness was increased in a pattern of mild LVH. The estimated ejection fraction was 55%. - Mitral valve: There was mild regurgitation. - Left atrium: The atrium was mildly dilated. - Atrial septum: No defect or patent foramen ovale was identified. - Pulmonary arteries: PA peak pressure: 33 mm Hg (S). - Impressions: LVEDP elevated based on lateral annular E/E&'   Antibiotics:  none  HPI/Subjective: Patient upset, she wanted to eat early.   no chest pain, no BM.  I was in the room for more than half hour explaining to patient plan of care. Explain to her need for stress test. We have to await for capsule endoscopy results.  She said she takes xanax up to three times a day.   Objective: Filed Vitals:   01/21/14 0426  BP: 158/54  Pulse: 78  Temp: 98.4 F (36.9  C)  Resp: 20    Intake/Output Summary (Last 24 hours) at 01/21/14 1149 Last data filed at 01/20/14 2213  Gross per 24 hour  Intake    720 ml  Output    375 ml  Net    345 ml   Filed Weights   01/19/14 1742 01/20/14 0529 01/21/14 0426  Weight: 81.738 kg (180 lb 3.2 oz) 82.691 kg (182 lb 4.8 oz) 82.7 kg (182 lb 5.1 oz)    Exam:   General:  No distress.   Cardiovascular: S 1, S 2 RRR  Respiratory: CTA  Abdomen: BS present, soft, NT  Musculoskeletal: no edema.   Data Reviewed: Basic Metabolic Panel:  Recent Labs Lab 01/19/14 1333 01/20/14 0855  NA 146 145  K 2.6* 3.6*  CL 103 104  CO2 29 23  GLUCOSE 196* 168*  BUN 9 7  CREATININE 0.84 0.88  CALCIUM 9.1 9.0   Liver Function Tests:  Recent Labs Lab 01/19/14 1333  AST 12  ALT 8  ALKPHOS 114  BILITOT <0.2*  PROT 7.3  ALBUMIN 3.8   No results found for this basename: LIPASE, AMYLASE,  in the last 168 hours No results found for this basename: AMMONIA,  in the last 168 hours CBC:  Recent Labs Lab 01/19/14 1333 01/19/14 1945 01/20/14 0855 01/20/14 1840 01/21/14 0541  WBC 8.2  --  8.7 6.4 8.7  HGB 7.9* 7.5* 9.6* 9.3* 9.0*  HCT 28.3* 26.7* 32.3* 31.1* 30.4*  MCV 73.9*  --  76.2* 77.4* 76.4*  PLT 440*  --  422* 372 388   Cardiac Enzymes:  Recent Labs  Lab 01/19/14 1333 01/19/14 1945 01/20/14 0855  TROPONINI 1.09* 1.56* 0.67*   BNP (last 3 results)  Recent Labs  01/19/14 1333  PROBNP 185.1   CBG: No results found for this basename: GLUCAP,  in the last 168 hours  Recent Results (from the past 240 hour(s))  MRSA PCR SCREENING     Status: None   Collection Time    01/19/14  6:24 PM      Result Value Ref Range Status   MRSA by PCR NEGATIVE  NEGATIVE Final   Comment:            The GeneXpert MRSA Assay (FDA     approved for NASAL specimens     only), is one component of a     comprehensive MRSA colonization     surveillance program. It is not     intended to diagnose MRSA      infection nor to guide or     monitor treatment for     MRSA infections.     Studies: Dg Chest 2 View  01/19/2014   CLINICAL DATA:  Short of breath.  Chest pain.  EXAM: CHEST  2 VIEW  COMPARISON:  10/07/2013  FINDINGS: Cardiac silhouette is normal in size. Normal mediastinal and hilar contours. Left anterior chest wall AICD is stable in well positioned.  Stable granuloma projects posteriorly in the mid right or left lower lobe superior segment on the lateral view. Lungs are hyperexpanded with minor interstitial thickening mostly at the peripheral lung bases. No lung consolidation or edema. No pleural effusion or pneumothorax.  Bony thorax is demineralized but grossly intact.  IMPRESSION: No acute cardiopulmonary disease.   Electronically Signed   By: Lajean Manes M.D.   On: 01/19/2014 14:04    Scheduled Meds: . aspirin EC  81 mg Oral Daily  . atorvastatin  80 mg Oral q1800  . DULoxetine  60 mg Oral BID  . furosemide  80 mg Oral Daily  . isosorbide mononitrate  30 mg Oral Daily  . lisinopril  20 mg Oral Daily  . metoprolol tartrate  25 mg Oral Daily  . pantoprazole  40 mg Oral Daily  . potassium chloride SA  40 mEq Oral BID  . sodium chloride  3 mL Intravenous Q12H   Continuous Infusions: . sodium chloride 500 mL (01/20/14 1436)    Active Problems:   DEPRESSION   CAD, 75% LAD, 75% RCA 2009, low risk Myoview 2/12   GERD   CONSTIPATION   Vertigo   Biventricular implantable cardioverter-defibrillator in situ   PAD (peripheral artery disease)   Anemia   Microcytic anemia   Chest pain   Elevated troponin   NSTEMI (non-ST elevated myocardial infarction)   Melena    Time spent: 35 minutes.     Niel Hummer A  [facility] Pager [phone]. If 7PM-7AM, please contact night-coverage at [email or web address], password [facility] 01/21/2014, 11:49 AM  LOS: 2 days

## 2014-01-22 DIAGNOSIS — Z86010 Personal history of colonic polyps: Secondary | ICD-10-CM

## 2014-01-22 DIAGNOSIS — Z9889 Other specified postprocedural states: Secondary | ICD-10-CM

## 2014-01-22 DIAGNOSIS — I209 Angina pectoris, unspecified: Secondary | ICD-10-CM

## 2014-01-22 DIAGNOSIS — K5900 Constipation, unspecified: Secondary | ICD-10-CM

## 2014-01-22 DIAGNOSIS — I739 Peripheral vascular disease, unspecified: Secondary | ICD-10-CM

## 2014-01-22 DIAGNOSIS — I2510 Atherosclerotic heart disease of native coronary artery without angina pectoris: Secondary | ICD-10-CM

## 2014-01-22 LAB — BASIC METABOLIC PANEL
Anion gap: 14 (ref 5–15)
BUN: 14 mg/dL (ref 6–23)
CALCIUM: 9.2 mg/dL (ref 8.4–10.5)
CHLORIDE: 101 meq/L (ref 96–112)
CO2: 28 meq/L (ref 19–32)
Creatinine, Ser: 0.94 mg/dL (ref 0.50–1.10)
GFR calc Af Amer: 67 mL/min — ABNORMAL LOW (ref >90)
GFR calc non Af Amer: 58 mL/min — ABNORMAL LOW (ref >90)
Glucose, Bld: 112 mg/dL — ABNORMAL HIGH (ref 70–99)
Potassium: 4.2 mEq/L (ref 3.7–5.3)
SODIUM: 143 meq/L (ref 137–147)

## 2014-01-22 LAB — GLUCOSE, CAPILLARY
GLUCOSE-CAPILLARY: 139 mg/dL — AB (ref 70–99)
Glucose-Capillary: 117 mg/dL — ABNORMAL HIGH (ref 70–99)
Glucose-Capillary: 145 mg/dL — ABNORMAL HIGH (ref 70–99)
Glucose-Capillary: 173 mg/dL — ABNORMAL HIGH (ref 70–99)

## 2014-01-22 LAB — CBC
HCT: 31.5 % — ABNORMAL LOW (ref 36.0–46.0)
Hemoglobin: 9.2 g/dL — ABNORMAL LOW (ref 12.0–15.0)
MCH: 22.4 pg — ABNORMAL LOW (ref 26.0–34.0)
MCHC: 29.2 g/dL — ABNORMAL LOW (ref 30.0–36.0)
MCV: 76.6 fL — ABNORMAL LOW (ref 78.0–100.0)
Platelets: 390 10*3/uL (ref 150–400)
RBC: 4.11 MIL/uL (ref 3.87–5.11)
RDW: 17.7 % — ABNORMAL HIGH (ref 11.5–15.5)
WBC: 8.3 10*3/uL (ref 4.0–10.5)

## 2014-01-22 MED ORDER — POTASSIUM CHLORIDE CRYS ER 20 MEQ PO TBCR
40.0000 meq | EXTENDED_RELEASE_TABLET | Freq: Every day | ORAL | Status: DC
  Administered 2014-01-23: 40 meq via ORAL
  Filled 2014-01-22: qty 2

## 2014-01-22 MED ORDER — POLYETHYLENE GLYCOL 3350 17 G PO PACK
17.0000 g | PACK | Freq: Every day | ORAL | Status: DC
  Filled 2014-01-22 (×2): qty 1

## 2014-01-22 MED ORDER — SODIUM CHLORIDE 0.9 % IV SOLN
1000.0000 mg | Freq: Once | INTRAVENOUS | Status: AC
  Administered 2014-01-22: 1000 mg via INTRAVENOUS
  Filled 2014-01-22: qty 20

## 2014-01-22 MED ORDER — SODIUM CHLORIDE 0.9 % IV SOLN
25.0000 mg | Freq: Once | INTRAVENOUS | Status: AC
  Administered 2014-01-22: 25 mg via INTRAVENOUS
  Filled 2014-01-22: qty 0.5

## 2014-01-22 NOTE — Progress Notes (Signed)
Pt ambulated the entire length of hallway. Sats remain 94-96%. Brief period of dyspnea midway with quick recovery. No sat drop

## 2014-01-22 NOTE — Progress Notes (Signed)
[facility] PROGRESS NOTE  Sydney Phillips JAS:[id number] DOB: 17-May-1936 DOA: 01/19/2014 PCP: Laurey Morale, MD  Assessment/Plan: 1-NSTEMI; CAD: Patient present with chest pain, elevated troponin.  Received 2 units of PRBC.  Cardiology following.  Continue with lasix, metoprolol, lisinopril,  Stress test 8-9.  2-Anemia; History of melena. Received 2 units PRBC. Continue with protonix.  endoscopy with no source for bleeding. She has Sessile polyp measuring 5 mm in size was found in the cardia. She will need repeat Endoscopy outpatient for biopsy. She underwent capsule endoscopy, which was negative.  Hb stable at 9. IV iron ordered.   3-Depression, Anxiety; Continue with Alprazolam and Cymbalta. Increase xanax to TID.   4-Hypokalemia; Resolved.  Received Kcl supplement.   5-s/pBiventricular implantable cardioverter-defibrillator in situ  6-Diabetes; new diagnosis. HB-A1c at 6.7. Need to work on diet. Diabetes educator consulted.   Code Status: Full code.  Family Communication: care discussed with patient. Disposition Plan: Remain inpatient.    Consultants:  Cardiology  GI  Procedures: ECHO; - Left ventricle: The cavity size was normal. Wall thickness was increased in a pattern of mild LVH. The estimated ejection fraction was 55%. - Mitral valve: There was mild regurgitation. - Left atrium: The atrium was mildly dilated. - Atrial septum: No defect or patent foramen ovale was identified. - Pulmonary arteries: PA peak pressure: 33 mm Hg (S). - Impressions: LVEDP elevated based on lateral annular E/E&'   Antibiotics:  none  HPI/Subjective: No BM. No chest pain, no dyspnea/  Objective: Filed Vitals:   01/22/14 1415  BP: 151/54  Pulse: 65  Temp: 98.7 F (37.1 C)  Resp: 20    Intake/Output Summary (Last 24 hours) at 01/22/14 1548 Last data filed at 01/22/14 1348  Gross per 24 hour  Intake    840 ml  Output    700 ml  Net    140 ml   Filed Weights   01/20/14 0529 01/21/14 0426 01/22/14 0511  Weight: 82.691 kg (182 lb 4.8 oz) 82.7 kg (182 lb 5.1 oz) 82.7 kg (182 lb 5.1 oz)    Exam:   General:  No distress.   Cardiovascular: S 1, S 2 RRR  Respiratory: CTA  Abdomen: BS present, soft, NT  Musculoskeletal: no edema.   Data Reviewed: Basic Metabolic Panel:  Recent Labs Lab 01/19/14 1333 01/20/14 0855 01/21/14 1300 01/22/14 0436  NA 146 145 138 143  K 2.6* 3.6* 4.3 4.2  CL 103 104 99 101  CO2 29 23 18* 28  GLUCOSE 196* 168* 234* 112*  BUN 9 7 8 14   CREATININE 0.84 0.88 0.99 0.94  CALCIUM 9.1 9.0 9.0 9.2   Liver Function Tests:  Recent Labs Lab 01/19/14 1333  AST 12  ALT 8  ALKPHOS 114  BILITOT <0.2*  PROT 7.3  ALBUMIN 3.8   No results found for this basename: LIPASE, AMYLASE,  in the last 168 hours No results found for this basename: AMMONIA,  in the last 168 hours CBC:  Recent Labs Lab 01/19/14 1333 01/19/14 1945 01/20/14 0855 01/20/14 1840 01/21/14 0541 01/22/14 0436  WBC 8.2  --  8.7 6.4 8.7 8.3  HGB 7.9* 7.5* 9.6* 9.3* 9.0* 9.2*  HCT 28.3* 26.7* 32.3* 31.1* 30.4* 31.5*  MCV 73.9*  --  76.2* 77.4* 76.4* 76.6*  PLT 440*  --  422* 372 388 390   Cardiac Enzymes:  Recent Labs Lab 01/19/14 1333 01/19/14 1945 01/20/14 0855  TROPONINI 1.09* 1.56* 0.67*   BNP (last 3 results)  Recent Labs  01/19/14 1333  PROBNP 185.1   CBG:  Recent Labs Lab 01/21/14 1615 01/21/14 2055 01/22/14 0651 01/22/14 1216  GLUCAP 176* 138* 117* 139*    Recent Results (from the past 240 hour(s))  MRSA PCR SCREENING     Status: None   Collection Time    01/19/14  6:24 PM      Result Value Ref Range Status   MRSA by PCR NEGATIVE  NEGATIVE Final   Comment:            The GeneXpert MRSA Assay (FDA     approved for NASAL specimens     only), is one component of a     comprehensive MRSA colonization     surveillance program. It is not     intended to diagnose MRSA     infection nor to guide or      monitor treatment for     MRSA infections.     Studies: No results found.  Scheduled Meds: . aspirin EC  81 mg Oral Daily  . atorvastatin  80 mg Oral q1800  . DULoxetine  60 mg Oral BID  . furosemide  80 mg Oral Daily  . insulin aspart  0-9 Units Subcutaneous TID WC  . iron dextran (INFED/DEXFERRUM) infusion  1,000 mg Intravenous Once  . isosorbide mononitrate  30 mg Oral Daily  . lisinopril  20 mg Oral Daily  . metoprolol tartrate  25 mg Oral Daily  . pantoprazole  40 mg Oral Daily  . polyethylene glycol  17 g Oral Daily  . potassium chloride SA  40 mEq Oral BID  . sodium chloride  3 mL Intravenous Q12H   Continuous Infusions: . sodium chloride 500 mL (01/20/14 1436)    Active Problems:   DEPRESSION   CAD, 75% LAD, 75% RCA 2009, low risk Myoview 2/12   GERD   CONSTIPATION   Vertigo   Biventricular implantable cardioverter-defibrillator in situ   PAD (peripheral artery disease)   Anemia   Microcytic anemia   Chest pain   Elevated troponin   NSTEMI (non-ST elevated myocardial infarction)   Melena    Time spent: 30 minutes.     Niel Hummer A  [facility] Pager [phone]. If 7PM-7AM, please contact night-coverage at [email or web address], password [facility] 01/22/2014, 3:48 PM  LOS: 3 days

## 2014-01-22 NOTE — Progress Notes (Addendum)
Pt. Seen and examined. Agree with the NP/PA-C note as written. Plan for stress test tomorrow. Will d/w with Dr. Hilarie Fredrickson as she has apparently not passed her capsule yet. No ongoing bleeding noted.  Pixie Casino, MD, [facility] Attending Cardiologist [place]

## 2014-01-22 NOTE — Progress Notes (Signed)
Primary Cardiologist: Crenshaw   Subjective:    Talks at length about bowel movement issues and changes in frequency and color. Denies chest pain or DOE now, but had significant symptoms of DOE prior to admission.   Objective:   Temp:  [97.3 F (36.3 C)-98.8 F (37.1 C)] 98.8 F (37.1 C) (08/09 0511) Pulse Rate:  [70-84] 78 (08/09 0511) Resp:  [18-20] 18 (08/09 0511) BP: (135-171)/(43-67) 135/67 mmHg (08/09 0511) SpO2:  [94 %-99 %] 94 % (08/09 0511) Weight:  [182 lb 5.1 oz (82.7 kg)] 182 lb 5.1 oz (82.7 kg) (08/09 0511) Last BM Date: 01/17/14  Filed Weights   01/20/14 0529 01/21/14 0426 01/22/14 0511  Weight: 182 lb 4.8 oz (82.691 kg) 182 lb 5.1 oz (82.7 kg) 182 lb 5.1 oz (82.7 kg)    Intake/Output Summary (Last 24 hours) at 01/22/14 0836 Last data filed at 01/22/14 0828  Gross per 24 hour  Intake    840 ml  Output    700 ml  Net    140 ml    Telemetry: NSR  Exam:  General: No acute distress.  HEENT: Conjunctiva and lids normal, oropharynx clear.  Lungs: Clear to auscultation, nonlabored.  Cardiac: No elevated JVP or bruits. RRR, no gallop or rub.   Abdomen: Normoactive bowel sounds, nontender, nondistended.  Extremities: No pitting edema, distal pulses full.  Neuropsychiatric: Alert and oriented x3, affect appropriate.   Lab Results:  Basic Metabolic Panel:  Recent Labs Lab 01/20/14 0855 01/21/14 1300 01/22/14 0436  NA 145 138 143  K 3.6* 4.3 4.2  CL 104 99 101  CO2 23 18* 28  GLUCOSE 168* 234* 112*  BUN 7 8 14   CREATININE 0.88 0.99 0.94  CALCIUM 9.0 9.0 9.2    Liver Function Tests:  Recent Labs Lab 01/19/14 1333  AST 12  ALT 8  ALKPHOS 114  BILITOT <0.2*  PROT 7.3  ALBUMIN 3.8    CBC:  Recent Labs Lab 01/20/14 1840 01/21/14 0541 01/22/14 0436  WBC 6.4 8.7 8.3  HGB 9.3* 9.0* 9.2*  HCT 31.1* 30.4* 31.5*  MCV 77.4* 76.4* 76.6*  PLT 372 388 390    Cardiac Enzymes:  Recent Labs Lab 01/19/14 1333 01/19/14 1945  01/20/14 0855  TROPONINI 1.09* 1.56* 0.67*    BNP:  Recent Labs  01/19/14 1333  PROBNP 185.1     Medications:   Scheduled Medications: . aspirin EC  81 mg Oral Daily  . atorvastatin  80 mg Oral q1800  . DULoxetine  60 mg Oral BID  . furosemide  80 mg Oral Daily  . insulin aspart  0-9 Units Subcutaneous TID WC  . isosorbide mononitrate  30 mg Oral Daily  . lisinopril  20 mg Oral Daily  . metoprolol tartrate  25 mg Oral Daily  . pantoprazole  40 mg Oral Daily  . potassium chloride SA  40 mEq Oral BID  . sodium chloride  3 mL Intravenous Q12H    Infusions: . sodium chloride 500 mL (01/20/14 1436)    PRN Medications: acetaminophen, acetaminophen, ALPRAZolam, HYDROcodone-acetaminophen, meclizine, nitroGLYCERIN, ondansetron (ZOFRAN) IV, ondansetron, polyethylene glycol, zolpidem   Assessment and Plan:   1. NSTEMI: Troponin has not been checked since 01/20/2014, but had trended down from 1.56 to 0.67. She denies any recurrent symptoms of chest pain or DOE. Likely related to demand ischemia in the setting of anemia. Plan for stress test this week once GI status is confirmed as stable to proceed. She remains on ASA  but no other antiplatelet or anticoagulants. NO chest pain on nitrates.   2. ICD in situ: Continue ongoing interrogations and follow up.   3. Anemia: Followed by GI. Capsul study is progress. Hgb is stable. No complaints of tarry or bloody stools. Complaints of constipation.   4. Hyperlipidemia: On statin  5, PVD: Bilateral carotid, subclavian, and femoral disease, slp Fem-Tib bypass per Dr. Oneida Alar in April of 2015. " I haven't felt right since."   Sydney Phillips. Lateka Rady NP  01/22/2014, 8:36 AM

## 2014-01-22 NOTE — Progress Notes (Signed)
MEDICATION RELATED CONSULT NOTE - INITIAL   Pharmacy Consult for IV Dextran Indication: IDA   Allergies  Allergen Reactions  . Codeine Itching  . Darvon Itching  . Meloxicam Other (See Comments)    Unknown    . Potassium-Containing Compounds Other (See Comments)    Causes severe constipation  . Propoxyphene N-Acetaminophen Itching  . Rofecoxib Other (See Comments)    Unknown    . Azithromycin Rash  . Erythromycin Rash  . Penicillins Rash  . Sulfa Antibiotics Rash    Patient Measurements: Height: 5' 6.5" (168.9 cm) Weight: 182 lb 5.1 oz (82.7 kg) (scale a) IBW/kg (Calculated) : 60.45   Vital Signs: Temp: 98.7 F (37.1 C) (08/09 1415) Temp src: Oral (08/09 1415) BP: 151/54 mmHg (08/09 1415) Pulse Rate: 65 (08/09 1415) Intake/Output from previous day: 08/08 0701 - 08/09 0700 In: 600 [P.O.:600] Out: 700 [Urine:700] Intake/Output from this shift: Total I/O In: 480 [P.O.:480] Out: -   Labs:  Recent Labs  01/20/14 0855 01/20/14 1840 01/21/14 0541 01/21/14 1300 01/22/14 0436  WBC 8.7 6.4 8.7  --  8.3  HGB 9.6* 9.3* 9.0*  --  9.2*  HCT 32.3* 31.1* 30.4*  --  31.5*  PLT 422* 372 388  --  390  CREATININE 0.88  --   --  0.99 0.94   Estimated Creatinine Clearance: 56.7 ml/min (by C-G formula based on Cr of 0.94).   Microbiology: Recent Results (from the past 720 hour(s))  MRSA PCR SCREENING     Status: None   Collection Time    01/19/14  6:24 PM      Result Value Ref Range Status   MRSA by PCR NEGATIVE  NEGATIVE Final   Comment:            The GeneXpert MRSA Assay (FDA     approved for NASAL specimens     only), is one component of a     comprehensive MRSA colonization     surveillance program. It is not     intended to diagnose MRSA     infection nor to guide or     monitor treatment for     MRSA infections.    Medical History: Past Medical History  Diagnosis Date  . COPD (chronic obstructive pulmonary disease)   . Hx of colonic polyps   .  Insomnia   . Fibromyalgia   . Hyperlipemia     takes Crestor daily  . CAD (coronary artery disease)     Currently angina free, no evidence of reversible ischemia  . Vertigo     takes Meclizine prn  . Subclavian arterial stenosis, lt, with PTA/STENT 07/31/11 08/01/2011  . S/P angioplasty with stent, lt. subclavian 07/31/11 08/01/2011  . Hypertension     takes Amlodipine and Metoprolol daily  . Bronchitis   . Arthritis   . Chronic back pain   . GERD (gastroesophageal reflux disease)     was on meds but was taken off;now watches what she eats  . Chronic constipation   . Hemorrhoids   . Urinary incontinence   . Early cataracts, bilateral   . Depression with anxiety     takes Cymbalta daily  . Congestive heart failure (CHF)     New York Heart Association functional class 2, diastolic dysfunction  . PAD (peripheral artery disease)     Carotid, subclavian, and lower extremity beds, currently not symptomatic  . Syncope 07/28/2011    EF - 50-55, moderate concentric hypertrophy in left  ventricle  . LBBB (left bundle branch block)     Stress test 09/03/2010, EF 55  . Nephrolithiasis   . Automatic implantable cardioverter-defibrillator in situ   . Presence of combination internal cardiac defibrillator (ICD) and pacemaker   . Shortness of breath     when over exerting self per pt.  . Colon polyps 2003.  2015.    HP polyps 2003.  adnomas 2015.  required referal to [facility] for colonoscopic resection of flat polyps.     Medications:  Prescriptions prior to admission  Medication Sig Dispense Refill  . ALPRAZolam (XANAX) 1 MG tablet Take 0.5 tablets (0.5 mg total) by mouth at bedtime as needed for anxiety.  30 tablet  0  . aspirin 325 MG tablet Take 81.25 mg by mouth daily. Take 1/4 tablet daily.      . cetirizine (ZYRTEC) 10 MG tablet Take 10 mg by mouth daily.      . clopidogrel (PLAVIX) 75 MG tablet Take 1 tablet (75 mg total) by mouth daily.  90 tablet  3  . DULoxetine (CYMBALTA) 60 MG  capsule Take 1 capsule (60 mg total) by mouth 2 (two) times daily.  60 capsule  11  . furosemide (LASIX) 80 MG tablet Take 80 mg by mouth daily.       Marland Kitchen HYDROcodone-acetaminophen (NORCO) 10-325 MG per tablet Take 1 tablet by mouth every 6 (six) hours as needed for moderate pain.  120 tablet  0  . lisinopril (PRINIVIL,ZESTRIL) 20 MG tablet Take 1 tablet (20 mg total) by mouth daily.  30 tablet  6  . meclizine (ANTIVERT) 25 MG tablet Take 1 tablet (25 mg total) by mouth every 4 (four) hours as needed for dizziness.  60 tablet  5  . metoprolol tartrate (LOPRESSOR) 25 MG tablet Take 1 tablet (25 mg total) by mouth daily.  30 tablet  3  . polyethylene glycol (MIRALAX / GLYCOLAX) packet Take 17 g by mouth daily as needed for mild constipation.      . potassium chloride SA (K-DUR,KLOR-CON) 20 MEQ tablet Take 40 mEq by mouth 2 (two) times daily.      Marland Kitchen zolpidem (AMBIEN) 10 MG tablet Take 1 tablet (10 mg total) by mouth at bedtime.  30 tablet  5    Assessment: 77 YOF with IDA to get IV iron infusion while admitted. She was diagnosed with microcytic anemia with a Hgb 9.2 and MCV of 76.6. She has no history of allergies to IV iron therapy.    Plan:  1) Administer test of of IV dextran 25 mg once over 5 minutes 2) If no reaction to test dose, then administer 1 gm of IV dextran over 4-6 hours  3) Discharge on oral iron supplement  Albertina Parr, PharmD.  Clinical Pharmacist Pager [phone]

## 2014-01-22 NOTE — Progress Notes (Signed)
    Progress Note   Subjective  Feels well No evidence for further bleeding Plans for stress test tomorrow Has not yet passed video capsule, study completed (See below)   Objective  Vital signs in last 24 hours: Temp:  [97.3 F (36.3 C)-98.8 F (37.1 C)] 98.8 F (37.1 C) (08/09 0511) Pulse Rate:  [70-84] 78 (08/09 0511) Resp:  [18-20] 18 (08/09 0511) BP: (135-171)/(43-67) 144/60 mmHg (08/09 1109) SpO2:  [94 %-99 %] 94 % (08/09 0511) Weight:  [182 lb 5.1 oz (82.7 kg)] 182 lb 5.1 oz (82.7 kg) (08/09 0511) Last BM Date: 01/17/14 Gen: awake, alert, NAD HEENT: anicteric, op clear CV: RRR, no mrg Pulm: CTA b/l Abd: soft, NT/ND, +BS throughout Ext: no c/c/e Neuro: nonfocal   Intake/Output from previous day: 08/08 0701 - 08/09 0700 In: 600 [P.O.:600] Out: 700 [Urine:700] Intake/Output this shift: Total I/O In: 480 [P.O.:480] Out: -   Lab Results:  Recent Labs  01/20/14 1840 01/21/14 0541 01/22/14 0436  WBC 6.4 8.7 8.3  HGB 9.3* 9.0* 9.2*  HCT 31.1* 30.4* 31.5*  PLT 372 388 390   BMET  Recent Labs  01/20/14 0855 01/21/14 1300 01/22/14 0436  NA 145 138 143  K 3.6* 4.3 4.2  CL 104 99 101  CO2 23 18* 28  GLUCOSE 168* 234* 112*  BUN 7 8 14   CREATININE 0.88 0.99 0.94  CALCIUM 9.0 9.0 9.2   Video capsule endoscopy, 01/20/2014 -- complete study, good prep. Normal small bowel without evidence for bleeding, vascular lesion, polyp, tumor.  Capsule did reach the cecum with all colonic views obscured by stool.   Assessment & Plan  78 year old female with history of multiple adenomatous colon polyps status post endoscopic mucosal resection November 2014 at [facility], CAD status post PCI on Plavix, COPD, extensive PVD, diastolic heart failure status post ICD, chronic kidney disease admitted with symptomatic microcytic anemia, NSTEMI, stools heme neg this admit.  1. Microcytic anemia/heme negative this admit -- upper endoscopy unrevealing, video capsule normal.  Colonoscopy with multiple polypectomy in November 2014 with plans for repeat in November 2015 at [facility].  Stool is heme-negative on admission. No significant GI inflammation or bleeding lesion found. Would replace iron, IV while here. Okay to proceed with cardiology workup. GI followup for colonoscopy as scheduled in November 2015 in [place]. Monitor hemoglobin and iron studies after discharge  2. Chronic constipation -- MiraLax 17 g daily  3.  CAD/PVD -- for stress test tomorrow. Possibly cardiac catheterization thereafter per cardiology. Given the lack of bleeding lesion found, patient felt okay to resume antiplatelet therapy if indicated per cardiology team  4.  Tobacco abuse -- ongoing issue, pt without current motivation to quit.    Active Problems:   DEPRESSION   CAD, 75% LAD, 75% RCA 2009, low risk Myoview 2/12   GERD   CONSTIPATION   Vertigo   Biventricular implantable cardioverter-defibrillator in situ   PAD (peripheral artery disease)   Anemia   Microcytic anemia   Chest pain   Elevated troponin   NSTEMI (non-ST elevated myocardial infarction)   Melena     LOS: 3 days   PYRTLE, JAY M  01/22/2014, 2:03 PM

## 2014-01-23 ENCOUNTER — Inpatient Hospital Stay (HOSPITAL_COMMUNITY): Payer: Medicare Other

## 2014-01-23 ENCOUNTER — Encounter (HOSPITAL_COMMUNITY): Payer: Self-pay | Admitting: Internal Medicine

## 2014-01-23 ENCOUNTER — Encounter (INDEPENDENT_AMBULATORY_CARE_PROVIDER_SITE_OTHER): Payer: Self-pay | Admitting: Ophthalmology

## 2014-01-23 DIAGNOSIS — R079 Chest pain, unspecified: Secondary | ICD-10-CM

## 2014-01-23 LAB — BASIC METABOLIC PANEL
ANION GAP: 12 (ref 5–15)
BUN: 13 mg/dL (ref 6–23)
CHLORIDE: 105 meq/L (ref 96–112)
CO2: 27 mEq/L (ref 19–32)
CREATININE: 0.94 mg/dL (ref 0.50–1.10)
Calcium: 8.9 mg/dL (ref 8.4–10.5)
GFR calc non Af Amer: 58 mL/min — ABNORMAL LOW (ref >90)
GFR, EST AFRICAN AMERICAN: 67 mL/min — AB (ref >90)
Glucose, Bld: 134 mg/dL — ABNORMAL HIGH (ref 70–99)
Potassium: 3.4 mEq/L — ABNORMAL LOW (ref 3.7–5.3)
Sodium: 144 mEq/L (ref 137–147)

## 2014-01-23 LAB — CBC
HEMATOCRIT: 30 % — AB (ref 36.0–46.0)
Hemoglobin: 8.8 g/dL — ABNORMAL LOW (ref 12.0–15.0)
MCH: 22.6 pg — AB (ref 26.0–34.0)
MCHC: 29.3 g/dL — ABNORMAL LOW (ref 30.0–36.0)
MCV: 76.9 fL — ABNORMAL LOW (ref 78.0–100.0)
PLATELETS: 361 10*3/uL (ref 150–400)
RBC: 3.9 MIL/uL (ref 3.87–5.11)
RDW: 18.1 % — AB (ref 11.5–15.5)
WBC: 8.1 10*3/uL (ref 4.0–10.5)

## 2014-01-23 LAB — GLUCOSE, CAPILLARY
Glucose-Capillary: 130 mg/dL — ABNORMAL HIGH (ref 70–99)
Glucose-Capillary: 199 mg/dL — ABNORMAL HIGH (ref 70–99)

## 2014-01-23 MED ORDER — NITROGLYCERIN 0.4 MG SL SUBL: 0.4000 mg | SUBLINGUAL_TABLET | SUBLINGUAL | Status: DC | PRN

## 2014-01-23 MED ORDER — REGADENOSON 0.4 MG/5ML IV SOLN
0.4000 mg | Freq: Once | INTRAVENOUS | Status: AC
  Administered 2014-01-23: 0.4 mg via INTRAVENOUS

## 2014-01-23 MED ORDER — ATORVASTATIN CALCIUM 80 MG PO TABS: 80.0000 mg | ORAL_TABLET | Freq: Every day | ORAL | Status: DC

## 2014-01-23 MED ORDER — TECHNETIUM TC 99M SESTAMIBI GENERIC - CARDIOLITE
30.0000 | Freq: Once | INTRAVENOUS | Status: AC | PRN
  Administered 2014-01-23: 30 via INTRAVENOUS

## 2014-01-23 MED ORDER — LIVING WELL WITH DIABETES BOOK
Freq: Once | Status: DC
  Filled 2014-01-23: qty 1

## 2014-01-23 MED ORDER — PANTOPRAZOLE SODIUM 40 MG PO TBEC: 40.0000 mg | DELAYED_RELEASE_TABLET | Freq: Every day | ORAL | Status: DC

## 2014-01-23 MED ORDER — REGADENOSON 0.4 MG/5ML IV SOLN
INTRAVENOUS | Status: AC
  Filled 2014-01-23: qty 5

## 2014-01-23 MED ORDER — ISOSORBIDE MONONITRATE ER 30 MG PO TB24: 30.0000 mg | ORAL_TABLET | Freq: Every day | ORAL | Status: DC

## 2014-01-23 MED ORDER — TECHNETIUM TC 99M SESTAMIBI GENERIC - CARDIOLITE
10.0000 | Freq: Once | INTRAVENOUS | Status: AC | PRN
  Administered 2014-01-23: 10 via INTRAVENOUS

## 2014-01-23 MED ORDER — FERROUS SULFATE 325 (65 FE) MG PO TABS: 325.0000 mg | ORAL_TABLET | Freq: Every day | ORAL | Status: DC

## 2014-01-23 NOTE — Evaluation (Signed)
Physical Therapy Evaluation and Discharge Patient Details Name: Sydney Phillips MRN: [id number] DOB: 09-28-1935 Today's Date: 01/23/2014   History of Present Illness  Pt is a 78 y/o female admitted with chest pain and anemia. Pt had positive NSTEMI.   Clinical Impression  Patient evaluated by Physical Therapy with no further acute PT needs identified. All education has been completed and the patient has no further questions. Pt reports her goal is to increase strength and endurance at home, and recommending HHPT to follow up. Acutely, pt does not require any further PT services at this time.  See below for any follow-up Physial Therapy or equipment needs. PT is signing off. Thank you for this referral.     Follow Up Recommendations Home health PT;Supervision - Intermittent    Equipment Recommendations  None recommended by PT    Recommendations for Other Services       Precautions / Restrictions Precautions Precautions: Fall Restrictions Weight Bearing Restrictions: No      Mobility  Bed Mobility               General bed mobility comments: Pt received standing EOB gathering clothes to don out of a bag.   Transfers Overall transfer level: Modified independent Equipment used: None             General transfer comment: Increased time before initiating ambulation from full stand.   Ambulation/Gait Ambulation/Gait assistance: Supervision Ambulation Distance (Feet): 200 Feet Assistive device: None Gait Pattern/deviations: WFL(Within Functional Limits)   Gait velocity interpretation: at or above normal speed for age/gender General Gait Details: Occasional unsteadiness. Pt and son state this is her baseline. No real LOB and pt did not require any physical assist to maintain her balance.   Stairs            Wheelchair Mobility    Modified Rankin (Stroke Patients Only)       Balance                                              Pertinent Vitals/Pain Pain Assessment: No/denies pain    Home Living Family/patient expects to be discharged to:: Private residence Living Arrangements: Children Available Help at Discharge: Available PRN/intermittently Type of Home: House Home Access: Stairs to enter Entrance Stairs-Rails: Can reach both Entrance Stairs-Number of Steps: 2 Home Layout: One level Home Equipment: Walker - 2 wheels;Bedside commode;Tub bench Additional Comments: Son works 4 miles away and can come home in the middle of the day if need be.     Prior Function Level of Independence: Independent;Independent with assistive device(s)         Comments: Occasionally uses walker for ambulation. States she has long hallways and often steadies herself on the walls if needed.     Hand Dominance   Dominant Hand: Right    Extremity/Trunk Assessment   Upper Extremity Assessment: Defer to OT evaluation           Lower Extremity Assessment: Overall WFL for tasks assessed      Cervical / Trunk Assessment: Normal  Communication   Communication: No difficulties  Cognition Arousal/Alertness: Awake/alert Behavior During Therapy: WFL for tasks assessed/performed Overall Cognitive Status: Within Functional Limits for tasks assessed                      General Comments  Exercises        Assessment/Plan    PT Assessment Patent does not need any further PT services  PT Diagnosis     PT Problem List    PT Treatment Interventions     PT Goals (Current goals can be found in the Care Plan section) Acute Rehab PT Goals PT Goal Formulation: No goals set, d/c therapy    Frequency     Barriers to discharge        Co-evaluation               End of Session   Activity Tolerance: Patient tolerated treatment well Patient left: in bed;with call bell/phone within reach;with nursing/sitter in room;with family/visitor present Nurse Communication: Mobility status          Time: 1540-1601 PT Time Calculation (min): 21 min   Charges:   PT Evaluation $Initial PT Evaluation Tier I: 1 Procedure PT Treatments $Therapeutic Activity: 8-22 mins   PT G CodesJolyn Phillips 01/23/2014, 4:43 PM  Sydney Phillips, PT, DPT Acute Rehabilitation Services Pager: [phone]

## 2014-01-23 NOTE — Progress Notes (Signed)
Patient given discharge information and all questions answered.  Patient discharged via wheelchair with all belongings.

## 2014-01-23 NOTE — Progress Notes (Signed)
UR completed Jalayiah Bibian K. Lise Pincus, RN, BSN, [place], CCM  01/23/2014 12:08 PM

## 2014-01-23 NOTE — Discharge Summary (Signed)
Physician Discharge Summary  Sydney Phillips:[id number] DOB: September 27, 1935 DOA: 01/19/2014  PCP: Sydney Morale, MD  Admit date: 01/19/2014 Discharge date: 01/23/2014  Time spent: 35 minutes  Recommendations for Outpatient Follow-up:  1. Need CBC to follow Hb.  2. Need to have CBG, and discussed options for treatment for diabetes.  3. Needs Endoscopy for polyp biopsy.  4. Need  followup for colonoscopy as scheduled in November 2015 in [place]   Discharge Diagnoses:    NSTEMI (non-ST elevated myocardial infarction)   Anemia; History of melena. No source of bleeding identified.    HYPERLIPIDEMIA, Hx of Niacin and statin intol   DEPRESSION   HTN, Nl renal arteries 3/12   CAD, 75% LAD, 75% RCA 2009, low risk Myoview 2/12   COPD   GERD   Hypokalemia   Biventricular implantable cardioverter-defibrillator in situ   PVD, known severe, (previuosly asymptomatic) LSCA disease, now with "high grade" RICA disease.   S/P angioplasty with stent, lt. subclavian 07/31/11   Microcytic anemia   Chest pain   Discharge Condition: Stable.   Diet recommendation: Carb modified.   Filed Weights   01/21/14 0426 01/22/14 0511 01/23/14 0504  Weight: 82.7 kg (182 lb 5.1 oz) 82.7 kg (182 lb 5.1 oz) 82.963 kg (182 lb 14.4 oz)    History of present illness:  Sydney Phillips is a 78 y.o. female with multiple medical problems as listed below including extensive vascular disease, hyperlipidemia, GERD, hypertension, COPD, anemia, vertigo who presents with above complaints. She states that for the past 2 months she's had intermittent chest pain-on both sides of the chest, worse with exertion and associated with shortness of breath. She denies diaphoresis. She reports that she saw her PCP and that was done and her hemoglobin was 7.9 (from 11.2- 1 month ago) and she was sent to the ED. She admits to melena over the past 2 months. She was seen in the ED and chest x-ray was negative for acute infiltrates,  troponin elevated at 1, hemoglobin was 7.9 and on rectal exam per EDP she had brown stool and was was guaiac-negative. She denies abdominal pain diarrhea and no fevers. EKG showed a paced rhythm with no acute ischemic changes. Cardiology was consulted, admission to [facility] was requested for further evaluation and management.  Patient reports that she had a colonoscopy done at [facility] November 2014 with polyp removal and was told that it was otherwise negative   Hospital Course:  1-NSTEMI; CAD: Patient present with chest pain, elevated troponin.  Received 2 units of PRBC. Cardiology consulted, they recommend stress test. Myoview was low risk. Sydney Phillips recommend medical treatment. Will continue with only [facility], no aspirin. Hopefully this will decrease risk for gastritis.  Continue with lasix, metoprolol, lisinopril.   2-Anemia; History of melena. Received 2 units PRBC. Continue with protonix. endoscopy with no source for bleeding. She has Sessile polyp measuring 5 mm in size was found in the cardia. She will need repeat Endoscopy outpatient for biopsy of polyp. She underwent capsule endoscopy, which was negative. Hb stable at 9-8.8. Received IV iron. She will be discharge on ferrous sulfate. GI was ok with resuming anticoagulation.   3-Depression, Anxiety; Continue with Alprazolam and [facility].  4-Hypokalemia;  Resolved.  Received Kcl supplement.  5-s/pBiventricular implantable cardioverter-defibrillator in situ  6-Diabetes; new diagnosis. HB-A1c at 6.7. Need to work on diet. Diabetes educator consulted. She decline medications at this time.    Procedures: ECHO; - Left ventricle: The cavity size was normal.  Wall thickness was increased in a pattern of mild LVH. The estimated ejection fraction was 55%. - Mitral valve: There was mild regurgitation. - Left atrium: The atrium was mildly dilated. - Atrial septum: No defect or patent foramen ovale was identified. - Pulmonary arteries: PA peak  pressure: 33 mm Hg (S). - Impressions: LVEDP elevated based on lateral annular E/E&'  Myoview: 1. Low risk Phillips nuclear stress test with no perfusion defect.  Normal LVEF 56% with no regional wall motion abnormalities.  Sydney Phillips    Consultations: Cardiology [facility].   Discharge Exam: Filed Vitals:   01/23/14 0955  BP: 172/58  Pulse: 88  Temp:   Resp:     General: No distress. Alert.  Cardiovascular: S 1, S 2 RRR Respiratory: CTA  Discharge Instructions You were cared for by a [facility] Phillips during your hospital stay. If you have any questions about your discharge medications or the care you received while you were in the hospital after you are discharged, you can call the unit and asked to speak with the Sydney Phillips on call if the Sydney Phillips that took care of you is not available. Once you are discharged, your primary care physician will handle any further medical issues. Please note that NO REFILLS for any discharge medications will be authorized once you are discharged, as it is imperative that you return to your primary care physician (or establish a relationship with a primary care physician if you do not have one) for your aftercare needs so that they can reassess your need for medications and monitor your lab values.     Medication List    STOP taking these medications       aspirin 325 MG tablet      TAKE these medications       ALPRAZolam 1 MG tablet  Commonly known as:  Sydney Phillips  Take 0.5 tablets (0.5 mg total) by mouth at bedtime as needed for anxiety.     atorvastatin 80 MG tablet  Commonly known as:  LIPITOR  Take 1 tablet (80 mg total) by mouth daily at 6 PM.     cetirizine 10 MG tablet  Commonly known as:  ZYRTEC  Take 10 mg by mouth daily.     clopidogrel 75 MG tablet  Commonly known as:  Syd[facility]  Take 1 tablet (75 mg total) by mouth daily.     DULoxetine 60 MG capsule  Commonly known as:  Syd[facility]  Take 1 capsule (60 mg total) by mouth 2  (two) times daily.     ferrous sulfate 325 (65 FE) MG tablet  Take 1 tablet (325 mg total) by mouth daily with breakfast.     furosemide 80 MG tablet  Commonly known as:  LASIX  Take 80 mg by mouth daily.     HYDROcodone-acetaminophen 10-325 MG per tablet  Commonly known as:  NORCO  Take 1 tablet by mouth every 6 (six) hours as needed for moderate pain.     isosorbide mononitrate 30 MG 24 hr tablet  Commonly known as:  IMDUR  Take 1 tablet (30 mg total) by mouth daily.     lisinopril 20 MG tablet  Commonly known as:  PRINIVIL,ZESTRIL  Take 1 tablet (20 mg total) by mouth daily.     meclizine 25 MG tablet  Commonly known as:  ANTIVERT  Take 1 tablet (25 mg total) by mouth every 4 (four) hours as needed for dizziness.     metoprolol tartrate 25 MG tablet  Commonly  known as:  LOPRESSOR  Take 1 tablet (25 mg total) by mouth daily.     nitroGLYCERIN 0.4 MG SL tablet  Commonly known as:  NITROSTAT  Place 1 tablet (0.4 mg total) under the tongue every 5 (five) minutes x 3 doses as needed for chest pain.     pantoprazole 40 MG tablet  Commonly known as:  PROTONIX  Take 1 tablet (40 mg total) by mouth daily.     polyethylene glycol packet  Commonly known as:  MIRALAX / GLYCOLAX  Take 17 g by mouth daily as needed for mild constipation.     potassium chloride SA 20 MEQ tablet  Commonly known as:  K-DUR,KLOR-CON  Take 40 mEq by mouth 2 (two) times daily.     zolpidem 10 MG tablet  Commonly known as:  AMBIEN  Take 1 tablet (10 mg total) by mouth at bedtime.       Allergies  Allergen Reactions  . Codeine Itching  . Darvon Itching  . Meloxicam Other (See Comments)    Unknown    . Potassium-Containing Compounds Other (See Comments)    Causes severe constipation  . Propoxyphene N-Acetaminophen Itching  . Rofecoxib Other (See Comments)    Unknown    . Azithromycin Rash  . Erythromycin Rash  . Penicillins Rash  . Sulfa Antibiotics Rash       Follow-up Information    Follow up with FRY,STEPHEN A, MD In 3 days. (you need CBC to follow Hemoglobine level. )    Specialty:  Family Medicine   Contact information:   [place] [address] [phone]       Follow up with Kirk Ruths, MD.   Specialty:  Cardiology   Contact information:   [address] [place] [address] [phone]       Follow up with [facility]. (Registered Nurse and Physical Therapy Services to start within 24-48 hours of discharge)    Contact information:   [address] [place] [address] [phone]        The results of significant diagnostics from this hospitalization (including imaging, microbiology, ancillary and laboratory) are listed below for reference.    Significant Diagnostic Studies: Dg Chest 2 View  01/19/2014   CLINICAL DATA:  Short of breath.  Chest pain.  EXAM: CHEST  2 VIEW  COMPARISON:  10/07/2013  FINDINGS: Cardiac silhouette is normal in size. Normal mediastinal and hilar contours. Left anterior chest wall AICD is stable in well positioned.  Stable granuloma projects posteriorly in the mid right or left lower lobe superior segment on the lateral view. Lungs are hyperexpanded with minor interstitial thickening mostly at the peripheral lung bases. No lung consolidation or edema. No pleural effusion or pneumothorax.  Bony thorax is demineralized but grossly intact.  IMPRESSION: No acute cardiopulmonary disease.   Electronically Signed   By: Lajean Manes M.D.   On: 01/19/2014 14:04   Nm Myocar Multi W/spect W/wall Motion / Ef  01/23/2014   CLINICAL DATA:  Chest pain  EXAM: Phillips Myovue  TECHNIQUE: The patient received IV Phillips .4mg  over 15 seconds. 33.0 mCi of Technetium 92m Sestamibi injected at 30 seconds. Quantitative SPECT images were obtained in the vertical, horizontal and short axis planes after a 45 minute delay. Rest images were obtained with similar planes and delay using 10.2  mCi of Technetium 71m Sestamibi.  FINDINGS: ECG:  A-sensed, V -paced rhyhm, 73 BPM, unchanged at stress  Symptoms:  None  RAW Data:  No TID, normal LHR  Quantitiative Gated SPECT EF: 56%, no regional wall motion abnormalities  Perfusion Images: Apical thinning at rest and at stress, no ischemia.  IMPRESSION: 1. Low risk Phillips nuclear stress test with no perfusion defect. Normal LVEF 56% with no regional wall motion abnormalities.  Sydney Phillips   Electronically Signed   By: [facility]   On: 01/23/2014 13:24    Microbiology: Recent Results (from the past 240 hour(s))  MRSA PCR SCREENING     Status: None   Collection Time    01/19/14  6:24 PM      Result Value Ref Range Status   MRSA by PCR NEGATIVE  NEGATIVE Final   Comment:            The GeneXpert MRSA Assay (FDA     approved for NASAL specimens     only), is one component of a     comprehensive MRSA colonization     surveillance program. It is not     intended to diagnose MRSA     infection nor to guide or     monitor treatment for     MRSA infections.     Labs: Basic Metabolic Panel:  Recent Labs Lab 01/19/14 1333 01/20/14 0855 01/21/14 1300 01/22/14 0436 01/23/14 0410  NA 146 145 138 143 144  K 2.6* 3.6* 4.3 4.2 3.4*  CL 103 104 99 101 105  CO2 29 23 18* 28 27  GLUCOSE 196* 168* 234* 112* 134*  BUN 9 7 8 14 13   CREATININE 0.84 0.88 0.99 0.94 0.94  CALCIUM 9.1 9.0 9.0 9.2 8.9   Liver Function Tests:  Recent Labs Lab 01/19/14 1333  AST 12  ALT 8  ALKPHOS 114  BILITOT <0.2*  PROT 7.3  ALBUMIN 3.8   No results found for this basename: LIPASE, AMYLASE,  in the last 168 hours No results found for this basename: AMMONIA,  in the last 168 hours CBC:  Recent Labs Lab 01/20/14 0855 01/20/14 1840 01/21/14 0541 01/22/14 0436 01/23/14 0410  WBC 8.7 6.4 8.7 8.3 8.1  HGB 9.6* 9.3* 9.0* 9.2* 8.8*  HCT 32.3* 31.1* 30.4* 31.5* 30.0*  MCV 76.2* 77.4* 76.4* 76.6* 76.9*  PLT 422* 372 388 390 361    Cardiac Enzymes:  Recent Labs Lab 01/19/14 1333 01/19/14 1945 01/20/14 0855  TROPONINI 1.09* 1.56* 0.67*   BNP: BNP (last 3 results)  Recent Labs  01/19/14 1333  PROBNP 185.1   CBG:  Recent Labs Lab 01/22/14 1216 01/22/14 1610 01/22/14 2236 01/23/14 0644 01/23/14 1141  GLUCAP 139* 145* 173* 130* 199*       Signed:  Orbie Grupe A  [facility] 01/23/2014, 2:46 PM

## 2014-01-23 NOTE — Progress Notes (Addendum)
Inpatient Diabetes Program Recommendations  AACE/ADA: New Consensus Statement on Inpatient Glycemic Control (2013)  Target Ranges:  Prepandial:   less than 140 mg/dL      Peak postprandial:   less than 180 mg/dL (1-2 hours)      Critically ill patients:  140 - 180 mg/dL   Reason for Visit:  Referral received regarding new diagnosis of diabetes.  Went in to speak to patient and she states that she does not believe that she has diabetes.  Briefly explained results of A1C test and patient continued to refute the diagnosis. She states that her HgB has dropped and she wonders if this may have contributed to her elevated A1C.  Encouraged her to follow-up and discuss with her PCP regarding diagnosis, A1C results and elevated CBG's.  She states that she plans to follow-up with Dr. Sharlene Motts as soon as possible.  Ordered her "Living Well with Diabetes" booklet and she agreed to look over.  Discussed briefly the role of CHO on blood sugars and potential ways to decrease sugar and CHO intake.  Patient is being discharged home today.   Adah Perl, RN, BC-ADM Inpatient Diabetes Coordinator Pager [phone]

## 2014-01-23 NOTE — Progress Notes (Signed)
Subjective:  No chest pain  Objective:  Vital Signs in the last 24 hours: Temp:  [97.9 F (36.6 C)-98.7 F (37.1 C)] 98.6 F (37 C) (08/10 0504) Pulse Rate:  [65-86] 86 (08/10 0504) Resp:  [17-20] 18 (08/10 0504) BP: (144-164)/(54-68) 164/68 mmHg (08/10 0504) SpO2:  [94 %-97 %] 94 % (08/10 0504) Weight:  [182 lb 14.4 oz (82.963 kg)] 182 lb 14.4 oz (82.963 kg) (08/10 0504)  Intake/Output from previous day:  Intake/Output Summary (Last 24 hours) at 01/23/14 0757 Last data filed at 01/23/14 0511  Gross per 24 hour  Intake    960 ml  Output    600 ml  Net    360 ml    Physical Exam: General appearance: alert, cooperative, no distress and moderately obese Lungs: clear to auscultation bilaterally Heart: regular rate and rhythm   Rate: 75  Rhythm: V paced  Lab Results:  Recent Labs  01/22/14 0436 01/23/14 0410  WBC 8.3 8.1  HGB 9.2* 8.8*  PLT 390 361    Recent Labs  01/22/14 0436 01/23/14 0410  NA 143 144  K 4.2 3.4*  CL 101 105  CO2 28 27  GLUCOSE 112* 134*  BUN 14 13  CREATININE 0.94 0.94    Recent Labs  01/20/14 0855  TROPONINI 0.67*   No results found for this basename: INR,  in the last 72 hours  Imaging: Imaging results have been reviewed  Cardiac Studies: 2D-  01/20/14 Study Conclusions  - Left ventricle: The cavity size was normal. Wall thickness was increased in a pattern of mild LVH. The estimated ejection fraction was 55%. - Mitral valve: There was mild regurgitation. - Left atrium: The atrium was mildly dilated. - Atrial septum: No defect or patent foramen ovale was identified. - Pulmonary arteries: PA peak pressure: 33 mm Hg (S). - Impressions: LVEDP elevated based on lateral annular E/E&'  Impressions:  - LVEDP elevated based on lateral annular E/E&'     Assessment/Plan:  This is a 78 year old woman with widespread PVD, s/p SCA PTA/ sten 07/31/11, who presents to the emergency room because of worsening fatigue and found  to have hemoglobin of 7.9. She ruled in for a NSTEMI with a Troponin of 1.56. Echo shows norma LVF.  She has a history of anemia and has had a work up here and at [facility], so far with no obvious etiology. She was transfused this admission.     Principal Problem:   NSTEMI (non-ST elevated myocardial infarction) Active Problems:   Microcytic anemia   Chest pain   Hypokalemia   Biventricular implantable cardioverter-defibrillator in situ   HYPERLIPIDEMIA, Hx of Niacin and statin intol   DEPRESSION   HTN, Nl renal arteries 3/12   CAD, 75% LAD, 75% RCA 2009, low risk Myoview 2/12   COPD   GERD   PVD, known severe, (previuosly asymptomatic) LSCA disease, now with "high grade" RICA disease.   S/P angioplasty with stent, lt. subclavian 07/31/11    PLAN:  Myoview today- r/o high risk CAD.   Kerin Ransom PA-C [facility] [phone] 01/23/2014, 7:57 AM  Attending Note:   The patient was seen and examined.  Agree with assessment and plan as noted above.  Changes made to the above note as needed.  I suspect that her very small Troponin bump is due to her severe anemia.  She does not have any cardiac complaints at baseline.  I prefer to treat her conservatively unless the myoview is high risk.  She is pain free.  Breathing well.   Thayer Headings, Brooke Bonito., MD, [facility] 01/23/2014, 8:07 AM

## 2014-01-25 ENCOUNTER — Telehealth: Payer: Self-pay | Admitting: Family Medicine

## 2014-01-25 NOTE — Telephone Encounter (Signed)
Request for a social worker consult, nurse from [place] called and would like to get this verbal order to assess the home situation. Per Dr. Sarajane Jews okay to give verbal order, which I did.

## 2014-01-26 ENCOUNTER — Encounter: Payer: Self-pay | Admitting: Family Medicine

## 2014-01-26 ENCOUNTER — Ambulatory Visit (INDEPENDENT_AMBULATORY_CARE_PROVIDER_SITE_OTHER): Payer: Medicare Other | Admitting: Family Medicine

## 2014-01-26 VITALS — BP 140/70 | HR 80 | Temp 99.3°F | Ht 66.5 in | Wt 184.0 lb

## 2014-01-26 DIAGNOSIS — K921 Melena: Secondary | ICD-10-CM

## 2014-01-26 DIAGNOSIS — M545 Low back pain, unspecified: Secondary | ICD-10-CM

## 2014-01-26 DIAGNOSIS — K219 Gastro-esophageal reflux disease without esophagitis: Secondary | ICD-10-CM

## 2014-01-26 DIAGNOSIS — F329 Major depressive disorder, single episode, unspecified: Secondary | ICD-10-CM

## 2014-01-26 DIAGNOSIS — IMO0001 Reserved for inherently not codable concepts without codable children: Secondary | ICD-10-CM

## 2014-01-26 DIAGNOSIS — R739 Hyperglycemia, unspecified: Secondary | ICD-10-CM

## 2014-01-26 DIAGNOSIS — F3289 Other specified depressive episodes: Secondary | ICD-10-CM

## 2014-01-26 DIAGNOSIS — R42 Dizziness and giddiness: Secondary | ICD-10-CM

## 2014-01-26 DIAGNOSIS — M4800 Spinal stenosis, site unspecified: Secondary | ICD-10-CM

## 2014-01-26 DIAGNOSIS — D509 Iron deficiency anemia, unspecified: Secondary | ICD-10-CM

## 2014-01-26 DIAGNOSIS — R7309 Other abnormal glucose: Secondary | ICD-10-CM

## 2014-01-26 LAB — CBC WITH DIFFERENTIAL/PLATELET
BASOS ABS: 0.1 10*3/uL (ref 0.0–0.1)
Basophils Relative: 0.8 % (ref 0.0–3.0)
EOS ABS: 0.6 10*3/uL (ref 0.0–0.7)
Eosinophils Relative: 6 % — ABNORMAL HIGH (ref 0.0–5.0)
HCT: 34.2 % — ABNORMAL LOW (ref 36.0–46.0)
HEMOGLOBIN: 10.4 g/dL — AB (ref 12.0–15.0)
LYMPHS PCT: 20.5 % (ref 12.0–46.0)
Lymphs Abs: 1.9 10*3/uL (ref 0.7–4.0)
MCHC: 30.4 g/dL (ref 30.0–36.0)
MCV: 77.4 fl — ABNORMAL LOW (ref 78.0–100.0)
Monocytes Absolute: 0.8 10*3/uL (ref 0.1–1.0)
Monocytes Relative: 9.2 % (ref 3.0–12.0)
Neutro Abs: 5.8 10*3/uL (ref 1.4–7.7)
Neutrophils Relative %: 63.5 % (ref 43.0–77.0)
PLATELETS: 445 10*3/uL — AB (ref 150.0–400.0)
RBC: 4.42 Mil/uL (ref 3.87–5.11)
RDW: 22.5 % — AB (ref 11.5–15.5)
WBC: 9.2 10*3/uL (ref 4.0–10.5)

## 2014-01-26 LAB — FERRITIN: FERRITIN: 361.5 ng/mL — AB (ref 10.0–291.0)

## 2014-01-26 LAB — GLUCOSE, POCT (MANUAL RESULT ENTRY): POC GLUCOSE: 122 mg/dL — AB (ref 70–99)

## 2014-01-26 NOTE — Progress Notes (Signed)
   Subjective:    Patient ID: Sydney Phillips, female    DOB: 1935/10/09, 78 y.o.   MRN: [id number]  HPI Here to follow up after a hospital stay from 01-19-14 to 01-23-14 for melena and anemia. Her Hgb on admission was 7.9. She had a polyp removed from her colon in November 2014 in [place] and a one year repeat colonscopy was planned. She was transfused 2 units of PRBC, and her Hgb on DC was up to 8.8. She had been having chest pains prior to the admission and she had an elevated troponin on admission. However a stress test showed low risk for ischemia. During this stay she had an upper endoscopy and a capsule endoscopy per Dr. Hilarie Fredrickson, showing only a benign appearing esophageal polyp. This was not felt to be the source of her blood loss. The polyp was not biopsied because she had been on Plavix and aspirin at the time. She was told to stay on Plavix but to stop all aspirin. She will need an upper endoscopy soon in order to biopsy the polyp. Since going home she has felt fine with more energy and no chest pain at all.    Review of Systems  Constitutional: Negative.   Respiratory: Negative.   Cardiovascular: Negative.   Gastrointestinal: Negative.   Neurological: Negative.        Objective:   Physical Exam  Constitutional: She is oriented to person, place, and time. She appears well-developed and well-nourished.  Cardiovascular: Normal rate, regular rhythm, normal heart sounds and intact distal pulses.   Pulmonary/Chest: Effort normal and breath sounds normal.  Neurological: She is alert and oriented to person, place, and time.          Assessment & Plan:  She seems to be stable right now. We will get another CBC today and she will stay on oral iron supplementation.

## 2014-01-26 NOTE — Progress Notes (Signed)
Pre visit review using our clinic review tool, if applicable. No additional management support is needed unless otherwise documented below in the visit note. 

## 2014-02-01 ENCOUNTER — Encounter: Payer: Self-pay | Admitting: Vascular Surgery

## 2014-02-02 ENCOUNTER — Ambulatory Visit (INDEPENDENT_AMBULATORY_CARE_PROVIDER_SITE_OTHER)
Admission: RE | Admit: 2014-02-02 | Discharge: 2014-02-02 | Disposition: A | Discharge status: Home / Self Care | Payer: Medicare Other | Source: Ambulatory Visit | Attending: Vascular Surgery | Admitting: Vascular Surgery

## 2014-02-02 ENCOUNTER — Encounter: Payer: Self-pay | Admitting: Vascular Surgery

## 2014-02-02 ENCOUNTER — Ambulatory Visit (HOSPITAL_COMMUNITY)
Admission: RE | Admit: 2014-02-02 | Discharge: 2014-02-02 | Disposition: A | Discharge status: Home / Self Care | Payer: Medicare Other | Source: Ambulatory Visit | Attending: Vascular Surgery | Admitting: Vascular Surgery

## 2014-02-02 ENCOUNTER — Ambulatory Visit (INDEPENDENT_AMBULATORY_CARE_PROVIDER_SITE_OTHER): Payer: Medicare Other | Admitting: Vascular Surgery

## 2014-02-02 VITALS — BP 110/60 | HR 74 | Temp 97.8°F | Resp 16 | Ht 65.0 in | Wt 175.0 lb

## 2014-02-02 DIAGNOSIS — I70219 Atherosclerosis of native arteries of extremities with intermittent claudication, unspecified extremity: Secondary | ICD-10-CM

## 2014-02-02 DIAGNOSIS — I739 Peripheral vascular disease, unspecified: Secondary | ICD-10-CM | POA: Diagnosis not present

## 2014-02-02 DIAGNOSIS — Z48812 Encounter for surgical aftercare following surgery on the circulatory system: Secondary | ICD-10-CM

## 2014-02-02 NOTE — Progress Notes (Signed)
[facility] [place] HISTORY AND PHYSICAL   Laurey Morale, MD  HPI: This is a 78 y.o. female who is here for follow up s/p right femoral to tibial peroneal trunk bypass on 10/12/13. At her last appointment on 10/27/13, she had a small hematoma in her right thigh. This has now resolved. She describes cramping in her right thigh intermittently at night. She denies any symptoms on her left side. She continues to smoke.   Past Medical History  Diagnosis Date  . COPD (chronic obstructive pulmonary disease)   . Hx of colonic polyps   . Insomnia   . Fibromyalgia   . Hyperlipemia     takes Crestor daily  . CAD (coronary artery disease)     Currently angina free, no evidence of reversible ischemia  . Vertigo     takes Meclizine prn  . Subclavian arterial stenosis, lt, with PTA/STENT 07/31/11 08/01/2011  . S/P angioplasty with stent, lt. subclavian 07/31/11 08/01/2011  . Hypertension     takes Amlodipine and Metoprolol daily  . Bronchitis   . Arthritis   . Chronic back pain   . GERD (gastroesophageal reflux disease)     was on meds but was taken off;now watches what she eats  . Chronic constipation   . Hemorrhoids   . Urinary incontinence   . Early cataracts, bilateral   . Depression with anxiety     takes Cymbalta daily  . Congestive heart failure (CHF)     New York Heart Association functional class 2, diastolic dysfunction  . PAD (peripheral artery disease)     Carotid, subclavian, and lower extremity beds, currently not symptomatic  . Syncope 07/28/2011    EF - 50-55, moderate concentric hypertrophy in left ventricle  . LBBB (left bundle branch block)     Stress test 09/03/2010, EF 55  . Nephrolithiasis   . Automatic implantable cardioverter-defibrillator in situ   . Presence of combination internal cardiac defibrillator (ICD) and pacemaker   . Shortness of breath     when over exerting self per pt.  . Colon polyps 2003.  2015.    HP polyps 2003.  adnomas  2015.  required referal to [facility] for colonoscopic resection of flat polyps.    Past Surgical History  Procedure Laterality Date  . Appendectomy    . Tonsillectomy    . Tubal ligation    . Cardiac defibrillator placement  05/2008    By Dr Blanch Media, [employer] CANNOT HAVE MRI's  . Abdominal hysterectomy    . Back surgery  2012  . Orif elbow fracture  08/16/2011    Procedure: OPEN REDUCTION INTERNAL FIXATION (ORIF) ELBOW/OLECRANON FRACTURE;  Surgeon: Schuyler Amor, MD;  Location: [facility];  Service: Orthopedics;  Laterality: Left;  . Cardiac catheterization  12/01/2007    By Dr. Melvern Banker, left heart cath,   . Coronary angioplasty    . Subclavian stent placement Left 07/31/2011    7x18 [employer], balloon, with reduction of 90% ostial left subclavian artery stenosis to 0% with residual excellent flow  . Femoral-popliteal bypass graft Right 10/12/2013    Procedure:   Femoral-Peroneal trunk  bypass with nonreversed greater saphenous vein graft;  Surgeon: Elam Dutch, MD;  Location: [facility];  Service: Vascular;  Laterality: Right;  . Intraoperative arteriogram Right 10/12/2013    Procedure: INTRA OPERATIVE ARTERIOGRAM;  Surgeon: Elam Dutch, MD;  Location: [facility];  Service: Vascular;  Laterality: Right;  . Colonoscopy w/ polypectomy  12/2013  .  Esophagogastroduodenoscopy N/A 01/20/2014    Procedure: ESOPHAGOGASTRODUODENOSCOPY (EGD);  Surgeon: Jerene Bears, MD;  Location: [facility];  Service: Endoscopy;  Laterality: N/A;  . Givens capsule study N/A 01/20/2014    Procedure: GIVENS CAPSULE STUDY;  Surgeon: Jerene Bears, MD;  Location: [facility];  Service: Gastroenterology;  Laterality: N/A;    Allergies  Allergen Reactions  . Codeine Itching  . Darvon Itching  . Meloxicam Other (See Comments)    Unknown    . Potassium-Containing Compounds Other (See Comments)    Causes severe constipation  . Propoxyphene N-Acetaminophen Itching  . Rofecoxib Other (See Comments)    Unknown    .  Azithromycin Rash  . Erythromycin Rash  . Penicillins Rash  . Sulfa Antibiotics Rash    Current Outpatient Prescriptions  Medication Sig Dispense Refill  . ALPRAZolam (XANAX) 1 MG tablet Take 0.5 tablets (0.5 mg total) by mouth at bedtime as needed for anxiety.  30 tablet  0  . atorvastatin (LIPITOR) 80 MG tablet Take 1 tablet (80 mg total) by mouth daily at 6 PM.  30 tablet  0  . clopidogrel (PLAVIX) 75 MG tablet Take 1 tablet (75 mg total) by mouth daily.  90 tablet  3  . DULoxetine (CYMBALTA) 60 MG capsule Take 1 capsule (60 mg total) by mouth 2 (two) times daily.  60 capsule  11  . ferrous sulfate 325 (65 FE) MG tablet Take 1 tablet (325 mg total) by mouth daily with breakfast.  90 tablet  3  . furosemide (LASIX) 80 MG tablet Take 80 mg by mouth daily.       Marland Kitchen HYDROcodone-acetaminophen (NORCO) 10-325 MG per tablet Take 1 tablet by mouth every 6 (six) hours as needed for moderate pain.  120 tablet  0  . lisinopril (PRINIVIL,ZESTRIL) 20 MG tablet Take 1 tablet (20 mg total) by mouth daily.  30 tablet  6  . metoprolol tartrate (LOPRESSOR) 25 MG tablet Take 1 tablet (25 mg total) by mouth daily.  30 tablet  3  . polyethylene glycol (MIRALAX / GLYCOLAX) packet Take 17 g by mouth daily as needed for mild constipation.      . potassium chloride SA (K-DUR,KLOR-CON) 20 MEQ tablet Take 40 mEq by mouth 2 (two) times daily.      Marland Kitchen zolpidem (AMBIEN) 10 MG tablet Take 1 tablet (10 mg total) by mouth at bedtime.  30 tablet  5  . cetirizine (ZYRTEC) 10 MG tablet Take 10 mg by mouth daily.      . isosorbide mononitrate (IMDUR) 30 MG 24 hr tablet Take 1 tablet (30 mg total) by mouth daily.  30 tablet  0  . meclizine (ANTIVERT) 25 MG tablet Take 1 tablet (25 mg total) by mouth every 4 (four) hours as needed for dizziness.  60 tablet  5  . nitroGLYCERIN (NITROSTAT) 0.4 MG SL tablet Place 1 tablet (0.4 mg total) under the tongue every 5 (five) minutes x 3 doses as needed for chest pain.  30 tablet  0  .  pantoprazole (PROTONIX) 40 MG tablet Take 1 tablet (40 mg total) by mouth daily.  30 tablet  0   No current facility-administered medications for this visit.    Family History  Problem Relation Age of Onset  . Hyperlipidemia    . Anesthesia problems Neg Hx   . Hypotension Neg Hx   . Malignant hyperthermia Neg Hx   . Pseudochol deficiency Neg Hx   . Colon cancer Maternal Grandmother   .  Cancer Sister     ovarian  . Diabetes Sister   . Heart disease Sister   . CAD Father   . Heart disease Father   . Hyperlipidemia Father   . Heart disease Mother   . Deep vein thrombosis Son     History   Social History  . Marital Status: Widowed    Spouse Name: N/A    Number of Children: 3  . Years of Education: N/A   Occupational History  . Not on file.   Social History Main Topics  . Smoking status: Current Every Day Smoker -- 60 years    Types: Cigarettes  . Smokeless tobacco: Never Used     Comment: 3 cigarettes per day  . Alcohol Use: No  . Drug Use: No  . Sexual Activity: No   Other Topics Concern  . Not on file   Social History Narrative   Ok to share information with medical POA, Son Gabriel Carina     PHYSICAL EXAMINATION:  Filed Vitals:   02/02/14 1558  BP: 110/60  Pulse: 74  Temp: 97.8 F (36.6 C)  Resp: 16   Body mass index is 29.12 kg/(m^2).  General:  WDWN in NAD Pulmonary: normal non-labored breathing , without Rales, rhonchi,  wheezing Cardiac: RRR, without  Murmurs, rubs or gallops; without carotid bruits Skin: Right leg incisions healed.  Vascular Exam/Pulses: 1-2+ palpable right popliteal pulse. Unable to palpate right DP and PT pulses Extremities: without ischemic changes, without Gangrene , without cellulitis; without open wounds;  Musculoskeletal: no muscle wasting or atrophy  Neurologic: A&O X 3; Appropriate Affect ; SENSATION: normal; MOTOR FUNCTION:  moving all extremities equally. Speech is fluent/normal  Non-Invasive Vascular Imaging:    ABIs: Right-0.97, Left 0.78  ASSESSMENT: 78 y.o. female s/p right femoral to tibial peroneal trunk bypass with vein 10/12/13.   PLAN: Normal ABIs today with patent graft.  Her incisions have healed. Will follow up in 3 months with ABIs and duplex. Encouraged smoking cessation.    Virgina Jock, PA-C [facility] [phone]  Clinic MD:  Pt seen and examined in conjunction with Dr. Oneida Alar   History and exam details as above. Greater than 3 minutes they spent regarding smoking cessation counseling. Patient had a graft duplex scan today which showed a patent bypass graft an ABI of 0.97. She does not have palpable pulses and probably has primarily peroneal runoff. She will followup in 3 months time.  Ruta Hinds, MD [facility] [place] Office: [phone] Pager: [phone]

## 2014-02-03 DIAGNOSIS — I214 Non-ST elevation (NSTEMI) myocardial infarction: Secondary | ICD-10-CM

## 2014-02-03 DIAGNOSIS — I509 Heart failure, unspecified: Secondary | ICD-10-CM

## 2014-02-03 DIAGNOSIS — J449 Chronic obstructive pulmonary disease, unspecified: Secondary | ICD-10-CM

## 2014-02-03 DIAGNOSIS — I739 Peripheral vascular disease, unspecified: Secondary | ICD-10-CM

## 2014-02-09 ENCOUNTER — Encounter: Payer: Self-pay | Admitting: Cardiovascular Disease

## 2014-02-13 ENCOUNTER — Ambulatory Visit: Payer: Medicare Other | Admitting: Cardiology

## 2014-03-14 ENCOUNTER — Encounter: Payer: Self-pay | Admitting: Cardiology

## 2014-04-12 ENCOUNTER — Telehealth: Payer: Self-pay | Admitting: Family Medicine

## 2014-04-12 NOTE — Telephone Encounter (Signed)
Stop Plavix 5 days prior to the procedure

## 2014-04-12 NOTE — Telephone Encounter (Signed)
Pt is on plavix and having colonoscopy on 11-9 at [facility]. Pt would like to know how many days should she stop plavix prior to colonoscopy.

## 2014-04-13 NOTE — Telephone Encounter (Signed)
I left a voice message with below information. 

## 2014-04-15 ENCOUNTER — Other Ambulatory Visit: Payer: Self-pay | Admitting: Family Medicine

## 2014-04-16 DIAGNOSIS — I5189 Other ill-defined heart diseases: Secondary | ICD-10-CM | POA: Insufficient documentation

## 2014-04-16 DIAGNOSIS — I447 Left bundle-branch block, unspecified: Secondary | ICD-10-CM | POA: Insufficient documentation

## 2014-04-17 DIAGNOSIS — I739 Peripheral vascular disease, unspecified: Secondary | ICD-10-CM

## 2014-04-17 DIAGNOSIS — D509 Iron deficiency anemia, unspecified: Secondary | ICD-10-CM | POA: Insufficient documentation

## 2014-04-17 DIAGNOSIS — I779 Disorder of arteries and arterioles, unspecified: Secondary | ICD-10-CM | POA: Insufficient documentation

## 2014-04-17 DIAGNOSIS — E119 Type 2 diabetes mellitus without complications: Secondary | ICD-10-CM

## 2014-04-24 ENCOUNTER — Telehealth: Payer: Self-pay | Admitting: Family Medicine

## 2014-04-24 MED ORDER — HYDROCODONE-ACETAMINOPHEN 10-325 MG PO TABS: 1.0000 | ORAL_TABLET | Freq: Four times a day (QID) | ORAL | Status: DC | PRN

## 2014-04-24 NOTE — Telephone Encounter (Signed)
Pt needs new rx hydrocodone °

## 2014-04-24 NOTE — Telephone Encounter (Signed)
done

## 2014-04-25 NOTE — Telephone Encounter (Signed)
Script is ready for pick up and I left a voice message for pt. 

## 2014-05-02 ENCOUNTER — Encounter: Payer: Self-pay | Admitting: Family Medicine

## 2014-05-02 ENCOUNTER — Ambulatory Visit (INDEPENDENT_AMBULATORY_CARE_PROVIDER_SITE_OTHER): Payer: Medicare Other | Admitting: Family Medicine

## 2014-05-02 ENCOUNTER — Other Ambulatory Visit: Payer: Self-pay | Admitting: Family Medicine

## 2014-05-02 VITALS — BP 145/79 | HR 83 | Temp 99.0°F | Ht 65.0 in | Wt 182.0 lb

## 2014-05-02 DIAGNOSIS — M791 Myalgia: Secondary | ICD-10-CM

## 2014-05-02 DIAGNOSIS — F329 Major depressive disorder, single episode, unspecified: Secondary | ICD-10-CM

## 2014-05-02 DIAGNOSIS — Z23 Encounter for immunization: Secondary | ICD-10-CM

## 2014-05-02 DIAGNOSIS — Z029 Encounter for administrative examinations, unspecified: Secondary | ICD-10-CM

## 2014-05-02 DIAGNOSIS — M609 Myositis, unspecified: Secondary | ICD-10-CM

## 2014-05-02 DIAGNOSIS — E119 Type 2 diabetes mellitus without complications: Secondary | ICD-10-CM

## 2014-05-02 DIAGNOSIS — IMO0001 Reserved for inherently not codable concepts without codable children: Secondary | ICD-10-CM

## 2014-05-02 DIAGNOSIS — F32A Depression, unspecified: Secondary | ICD-10-CM

## 2014-05-02 MED ORDER — ALPRAZOLAM 1 MG PO TABS: 1.0000 mg | ORAL_TABLET | Freq: Four times a day (QID) | ORAL | Status: DC | PRN

## 2014-05-02 MED ORDER — ALPRAZOLAM 1 MG PO TABS: 0.5000 mg | ORAL_TABLET | Freq: Every evening | ORAL | Status: DC | PRN

## 2014-05-02 MED ORDER — POLYETHYLENE GLYCOL 3350 17 G PO PACK: 17.0000 g | PACK | Freq: Every day | ORAL | Status: DC | PRN

## 2014-05-02 MED ORDER — METOPROLOL TARTRATE 25 MG PO TABS: 25.0000 mg | ORAL_TABLET | Freq: Every day | ORAL | Status: DC

## 2014-05-02 NOTE — Progress Notes (Signed)
Pre visit review using our clinic review tool, if applicable. No additional management support is needed unless otherwise documented below in the visit note. 

## 2014-05-02 NOTE — Progress Notes (Signed)
   Subjective:    Patient ID: Sydney Phillips, female    DOB: 08-Jul-1935, 78 y.o.   MRN: [id number]  HPI Here for refills and with questions about a recent diagnosis of diabetes. She was told this in the hospital this summer but she is not sure. I see from her chart that she had a random glucose on 01-24-14 of 199 and she had an A1c of 6.7 on 01-21-14. She does not watch her diet and she does not exercise. Her chronic pain is stable.    Review of Systems  Constitutional: Negative.   Respiratory: Negative.   Cardiovascular: Negative.   Musculoskeletal: Positive for back pain and arthralgias.  Neurological: Negative.        Objective:   Physical Exam  Constitutional: She is oriented to person, place, and time. She appears well-developed and well-nourished.  Cardiovascular: Normal rate, regular rhythm, normal heart sounds and intact distal pulses.   Pulmonary/Chest: Effort normal and breath sounds normal.  Neurological: She is alert and oriented to person, place, and time.          Assessment & Plan:  She does in fact have mild type 2 diabetes. I think she can manage this with diet alone. We discussed limiting her intake of carbs. We will get another A1c today. Refilled her meds.

## 2014-05-03 LAB — HEMOGLOBIN A1C: HEMOGLOBIN A1C: 7.1 % — AB (ref 4.6–6.5)

## 2014-05-05 ENCOUNTER — Telehealth: Payer: Self-pay | Admitting: Family Medicine

## 2014-05-05 NOTE — Telephone Encounter (Signed)
I spoke with pt and went over results. 

## 2014-05-05 NOTE — Telephone Encounter (Signed)
Pt wouls like a call back about lab results A1C

## 2014-05-09 ENCOUNTER — Encounter: Payer: Self-pay | Admitting: Family Medicine

## 2014-05-17 ENCOUNTER — Encounter: Payer: Self-pay | Admitting: Vascular Surgery

## 2014-05-18 ENCOUNTER — Other Ambulatory Visit (HOSPITAL_COMMUNITY): Payer: Medicare Other

## 2014-05-18 ENCOUNTER — Ambulatory Visit: Payer: Medicare Other | Admitting: Vascular Surgery

## 2014-05-18 ENCOUNTER — Encounter (HOSPITAL_COMMUNITY): Payer: Medicare Other

## 2014-05-24 ENCOUNTER — Encounter: Payer: Self-pay | Admitting: Family Medicine

## 2014-05-24 ENCOUNTER — Ambulatory Visit (INDEPENDENT_AMBULATORY_CARE_PROVIDER_SITE_OTHER): Payer: Medicare Other | Admitting: Family Medicine

## 2014-05-24 VITALS — BP 125/76 | HR 88 | Temp 98.3°F

## 2014-05-24 DIAGNOSIS — J209 Acute bronchitis, unspecified: Secondary | ICD-10-CM

## 2014-05-24 MED ORDER — DOXYCYCLINE HYCLATE 100 MG PO CAPS: 100.0000 mg | ORAL_CAPSULE | Freq: Two times a day (BID) | ORAL | Status: AC

## 2014-05-24 MED ORDER — HYDROCODONE-HOMATROPINE 5-1.5 MG/5ML PO SYRP: 5.0000 mL | ORAL_SOLUTION | ORAL | Status: DC | PRN

## 2014-05-24 NOTE — Progress Notes (Signed)
   Subjective:    Patient ID: Sydney Phillips, female    DOB: Jul 08, 1935, 78 y.o.   MRN: [id number]  HPI Here for one week of chest tightness and a dry cough. No fever. On Mucinex   Review of Systems  Constitutional: Negative.   HENT: Positive for congestion and postnasal drip. Negative for sinus pressure.   Eyes: Negative.   Respiratory: Positive for cough, chest tightness and wheezing. Negative for shortness of breath.        Objective:   Physical Exam  Constitutional: She appears well-developed and well-nourished.  HENT:  Right Ear: External ear normal.  Left Ear: External ear normal.  Nose: Nose normal.  Mouth/Throat: Oropharynx is clear and moist.  Eyes: Conjunctivae are normal.  Pulmonary/Chest: Effort normal. She has no rales.  Scattered rhonchi and wheezes  Lymphadenopathy:    She has no cervical adenopathy.          Assessment & Plan:  Recheck prn

## 2014-05-24 NOTE — Progress Notes (Signed)
Pre visit review using our clinic review tool, if applicable. No additional management support is needed unless otherwise documented below in the visit note. 

## 2014-05-25 ENCOUNTER — Encounter (HOSPITAL_COMMUNITY): Payer: Self-pay | Admitting: Cardiovascular Disease

## 2014-06-14 ENCOUNTER — Encounter: Payer: Self-pay | Admitting: Vascular Surgery

## 2014-06-15 ENCOUNTER — Encounter: Payer: Self-pay | Admitting: Vascular Surgery

## 2014-06-15 ENCOUNTER — Ambulatory Visit (INDEPENDENT_AMBULATORY_CARE_PROVIDER_SITE_OTHER): Payer: Medicare Other | Admitting: Vascular Surgery

## 2014-06-15 ENCOUNTER — Ambulatory Visit (HOSPITAL_COMMUNITY)
Admission: RE | Admit: 2014-06-15 | Discharge: 2014-06-15 | Disposition: A | Discharge status: Home / Self Care | Payer: Medicare Other | Source: Ambulatory Visit | Attending: Vascular Surgery | Admitting: Vascular Surgery

## 2014-06-15 ENCOUNTER — Ambulatory Visit (INDEPENDENT_AMBULATORY_CARE_PROVIDER_SITE_OTHER)
Admission: RE | Admit: 2014-06-15 | Discharge: 2014-06-15 | Disposition: A | Discharge status: Home / Self Care | Payer: Medicare Other | Source: Ambulatory Visit | Attending: Vascular Surgery | Admitting: Vascular Surgery

## 2014-06-15 VITALS — BP 174/74 | HR 70 | Ht 65.0 in | Wt 185.4 lb

## 2014-06-15 DIAGNOSIS — I6523 Occlusion and stenosis of bilateral carotid arteries: Secondary | ICD-10-CM

## 2014-06-15 DIAGNOSIS — I70219 Atherosclerosis of native arteries of extremities with intermittent claudication, unspecified extremity: Secondary | ICD-10-CM

## 2014-06-15 DIAGNOSIS — Z48812 Encounter for surgical aftercare following surgery on the circulatory system: Secondary | ICD-10-CM

## 2014-06-15 DIAGNOSIS — I739 Peripheral vascular disease, unspecified: Secondary | ICD-10-CM

## 2014-06-15 NOTE — Progress Notes (Signed)
HISTORY AND PHYSICAL     CC:  F/u s/p surgery Referring Provider:  Laurey Morale, MD  HPI: This is a 78 y.o. female who is s/p right femoral to PT trunk bypass graft with non-reversed ipsilateral great saphenous vein and intraoperative arteriogram on 10/12/13.  She returns today for follow up ABI's and arterial duplex.    She states that she has done quite well since her last visit and feels the best that she has felt in 20 years.  She states that earlier this month, she had a sinus infection and was prescribed ABx.  She states that the pain in the bottom of her feet have resolved since taking the ABx.    She states that she continues to try to quite smoking.  She is down to 3 cigarettes a day.  She is using coloring books to occupy her time instead of smoking and she states this is working well for her.    She denies any hx or symptoms of stroke.  She states that she did have a heart attack in August that was diagnosed by blood work and was admitted to the hospital and also received a blood transfusion at that time.  She is on an ACEI for her hypertension.  Past Medical History  Diagnosis Date  . COPD (chronic obstructive pulmonary disease)   . Hx of colonic polyps   . Insomnia   . Fibromyalgia   . Hyperlipemia     takes Crestor daily  . CAD (coronary artery disease)     Currently angina free, no evidence of reversible ischemia  . Vertigo     takes Meclizine prn  . Subclavian arterial stenosis, lt, with PTA/STENT 07/31/11 08/01/2011  . S/P angioplasty with stent, lt. subclavian 07/31/11 08/01/2011  . Hypertension     takes Amlodipine and Metoprolol daily  . Bronchitis   . Arthritis   . Chronic back pain   . GERD (gastroesophageal reflux disease)     was on meds but was taken off;now watches what she eats  . Chronic constipation   . Hemorrhoids   . Urinary incontinence   . Early cataracts, bilateral   . Depression with anxiety     takes Cymbalta daily  . Congestive heart  failure (CHF)     New York Heart Association functional class 2, diastolic dysfunction  . PAD (peripheral artery disease)     Carotid, subclavian, and lower extremity beds, currently not symptomatic  . Syncope 07/28/2011    EF - 50-55, moderate concentric hypertrophy in left ventricle  . LBBB (left bundle branch block)     Stress test 09/03/2010, EF 55  . Nephrolithiasis   . Automatic implantable cardioverter-defibrillator in situ   . Presence of combination internal cardiac defibrillator (ICD) and pacemaker   . Shortness of breath     when over exerting self per pt.  . Colon polyps 2003.  2015.    HP polyps 2003.  adnomas 2015.  required referal to [facility] for colonoscopic resection of flat polyps.     Past Surgical History  Procedure Laterality Date  . Appendectomy    . Tonsillectomy    . Tubal ligation    . Cardiac defibrillator placement  05/2008    By Dr Blanch Media, [employer] CANNOT HAVE MRI's  . Abdominal hysterectomy    . Back surgery  2012  . Orif elbow fracture  08/16/2011    Procedure: OPEN REDUCTION INTERNAL FIXATION (ORIF) ELBOW/OLECRANON FRACTURE;  Surgeon: Schuyler Amor, MD;  Location: [place];  Service: Orthopedics;  Laterality: Left;  . Cardiac catheterization  12/01/2007    By Dr. Melvern Banker, left heart cath,   . Coronary angioplasty    . Subclavian stent placement Left 07/31/2011    7x18 [employer], balloon, with reduction of 90% ostial left subclavian artery stenosis to 0% with residual excellent flow  . Femoral-popliteal bypass graft Right 10/12/2013    Procedure:   Femoral-Peroneal trunk  bypass with nonreversed greater saphenous vein graft;  Surgeon: Elam Dutch, MD;  Location: [place];  Service: Vascular;  Laterality: Right;  . Intraoperative arteriogram Right 10/12/2013    Procedure: INTRA OPERATIVE ARTERIOGRAM;  Surgeon: Elam Dutch, MD;  Location: [place];  Service: Vascular;  Laterality: Right;  . Colonoscopy w/ polypectomy  12/2013  . Esophagogastroduodenoscopy  N/A 01/20/2014    Procedure: ESOPHAGOGASTRODUODENOSCOPY (EGD);  Surgeon: Jerene Bears, MD;  Location: [facility];  Service: Endoscopy;  Laterality: N/A;  . Givens capsule study N/A 01/20/2014    Procedure: GIVENS CAPSULE STUDY;  Surgeon: Jerene Bears, MD;  Location: [place];  Service: Gastroenterology;  Laterality: N/A;  . Carotid angiogram N/A 07/31/2011    Procedure: CAROTID ANGIOGRAM;  Surgeon: Lorretta Harp, MD;  Location: [facility] CATH LAB;  Service: Cardiovascular;  Laterality: N/A;  carotid angiogram and possible Lt SCA PTA  . Biv icd genertaor change out Left 02/20/2012    Procedure: BIV ICD GENERTAOR CHANGE OUT;  Surgeon: [name], MD;  Location: [facility] CATH LAB;  Service: Cardiovascular;  Laterality: Left;  . Abdominal aortagram N/A 08/12/2013    Procedure: ABDOMINAL Maxcine Ham;  Surgeon: Elam Dutch, MD;  Location: [facility] CATH LAB;  Service: Cardiovascular;  Laterality: N/A;  . Renal angiogram N/A 08/12/2013    Procedure: RENAL ANGIOGRAM;  Surgeon: Elam Dutch, MD;  Location: [facility] CATH LAB;  Service: Cardiovascular;  Laterality: N/A;    Allergies  Allergen Reactions  . Codeine Itching  . Darvon Itching  . Meloxicam Other (See Comments)    Unknown    . Potassium-Containing Compounds Other (See Comments)    Causes severe constipation  . Propoxyphene N-Acetaminophen Itching  . Rofecoxib Other (See Comments)    Unknown    . Azithromycin Rash  . Erythromycin Rash  . Penicillins Rash  . Sulfa Antibiotics Rash    Current Outpatient Prescriptions  Medication Sig Dispense Refill  . ALPRAZolam (XANAX) 1 MG tablet Take 1 tablet (1 mg total) by mouth every 6 (six) hours as needed for anxiety. 120 tablet 5  . clopidogrel (PLAVIX) 75 MG tablet Take 1 tablet (75 mg total) by mouth daily. 90 tablet 3  . DULoxetine (CYMBALTA) 60 MG capsule Take 1 capsule (60 mg total) by mouth 2 (two) times daily. 60 capsule 11  . furosemide (LASIX) 80 MG tablet Take 80 mg by mouth daily.     .  furosemide (LASIX) 80 MG tablet TAKE ONE TABLET BY MOUTH TWICE DAILY 180 tablet 1  . isosorbide mononitrate (IMDUR) 30 MG 24 hr tablet Take 1 tablet (30 mg total) by mouth daily. 30 tablet 0  . lisinopril (PRINIVIL,ZESTRIL) 20 MG tablet Take 1 tablet (20 mg total) by mouth daily. 30 tablet 6  . metoprolol tartrate (LOPRESSOR) 25 MG tablet Take 1 tablet (25 mg total) by mouth daily. 30 tablet 11  . polyethylene glycol (MIRALAX / GLYCOLAX) packet Take 17 g by mouth daily as needed for mild constipation. 30 each 11  . potassium chloride SA (K-DUR,KLOR-CON) 20 MEQ tablet Take  20 mEq by mouth 2 (two) times daily.     . cetirizine (ZYRTEC) 10 MG tablet Take 10 mg by mouth daily.    . ferrous sulfate 325 (65 FE) MG tablet Take 1 tablet (325 mg total) by mouth daily with breakfast. (Patient not taking: Reported on 06/15/2014) 90 tablet 3  . HYDROcodone-acetaminophen (NORCO) 10-325 MG per tablet Take 1 tablet by mouth every 6 (six) hours as needed for moderate pain. (Patient not taking: Reported on 06/15/2014) 120 tablet 0  . HYDROcodone-homatropine (HYDROMET) 5-1.5 MG/5ML syrup Take 5 mLs by mouth every 4 (four) hours as needed. (Patient not taking: Reported on 06/15/2014) 240 mL 0  . meclizine (ANTIVERT) 25 MG tablet Take 1 tablet (25 mg total) by mouth every 4 (four) hours as needed for dizziness. (Patient not taking: Reported on 06/15/2014) 60 tablet 5  . nitroGLYCERIN (NITROSTAT) 0.4 MG SL tablet Place 1 tablet (0.4 mg total) under the tongue every 5 (five) minutes x 3 doses as needed for chest pain. (Patient not taking: Reported on 06/15/2014) 30 tablet 0  . pantoprazole (PROTONIX) 40 MG tablet Take 1 tablet (40 mg total) by mouth daily. (Patient not taking: Reported on 06/15/2014) 30 tablet 0   No current facility-administered medications for this visit.    Family History  Problem Relation Age of Onset  . Hyperlipidemia    . Anesthesia problems Neg Hx   . Hypotension Neg Hx   . Malignant  hyperthermia Neg Hx   . Pseudochol deficiency Neg Hx   . Colon cancer Maternal Grandmother   . Cancer Sister     ovarian  . Diabetes Sister   . Heart disease Sister   . CAD Father   . Heart disease Father   . Hyperlipidemia Father   . Heart disease Mother   . Deep vein thrombosis Son     History   Social History  . Marital Status: Widowed    Spouse Name: N/A    Number of Children: 3  . Years of Education: N/A   Occupational History  . Not on file.   Social History Main Topics  . Smoking status: Current Every Day Smoker -- 60 years    Types: Cigarettes  . Smokeless tobacco: Never Used     Comment: 3 cigarettes per day  . Alcohol Use: No  . Drug Use: No  . Sexual Activity: No   Other Topics Concern  . Not on file   Social History Narrative   Ok to share information with medical POA, Son Gabriel Carina     ROS: [x]  Positive   [ ]  Negative   [ ]  All sytems reviewed and are negative  Cardiovascular: []  chest pain/pressure [x]  Hx MI in August dx by bloodwork []  palpitations []  SOB lying flat []  DOE []  pain in legs while walking []  pain in feet when lying flat []  hx of DVT []  hx of phlebitis []  swelling in legs []  varicose veins  Pulmonary: []  productive cough [x]  sinus infection earlier this month-resolved []  asthma []  wheezing  Neurologic: []  weakness in []  arms []  legs []  numbness in []  arms []  legs [] difficulty speaking or slurred speech []  temporary loss of vision in one eye []  dizziness  Hematologic: []  bleeding problems []  problems with blood clotting easily [x]  anemic s/p surgery-needed transfusion in August  GI []  vomiting blood []  blood in stool  GU: []  burning with urination []  blood in urine  Psychiatric: []  hx of major depression  Integumentary: []   rashes []  ulcers  Constitutional: []  fever []  chills   PHYSICAL EXAMINATION:  Filed Vitals:   06/15/14 0939  BP: 174/74  Pulse: 70   Body mass index is 30.85  kg/(m^2).  General:  WDWN in NAD Gait: Not observed HENT: WNL, normocephalic Pulmonary: normal non-labored breathing , without Rales, rhonchi,  wheezing Cardiac: RRR, with Murmur,without  rubs or gallops; with faint bilateral carotid bruits Abdomen: soft, NT, no masses Skin: without rashes, without ulcers  Vascular Exam/Pulses:  Right Left  Radial 2+ (normal) 1+ (weak)  Ulnar Unable to palpate  Unable to palpate   Popliteal Unable to palpate  Unable to palpate   DP 2+ (normal) 2+ (normal)  PT Unable to palpate  Unable to palpate    Extremities: without ischemic changes, without Gangrene , without cellulitis; with very small scratch over the dorsal aspect of right foot where DP pulse is felt from a scratch.  Musculoskeletal: no muscle wasting or atrophy  Neurologic: A&O X 3; Appropriate Affect ; SENSATION: normal; MOTOR FUNCTION:  moving all extremities equally. Speech is fluent/normal   Non-Invasive Vascular Imaging:   ABI's 06/15/14: Right:  1.19 Left:  1.05  TBI's: Right:  0.75 Left:  0.72  Lower extremity Arterial Duplex 06/15/14: Patent bypass graft  Previous ABI's 02/02/14: Right:  0.97 Left:  0.78  CTA Neck 11/30/13: CTA NECK:  1. Irregular short-segment stenosis of approximately 70% within the proximal left internal carotid artery just distal to the carotid bifurcation. This stenosis measures approximately 5 mm in length. 2. Second short-segment stenosis of approximately 50% within the left internal carotid artery just prior to entering the skullbase. 3. No evidence of dissection or other high-grade flow-limiting stenosis within the arterial vasculature of the neck. 4. Patent endovascular stent within the proximal left subclavian Artery.  Pt meds includes: Statin:  No. Beta Blocker:  Yes.   Aspirin:  No. ACEI:  Yes.   ARB:  No. Other Antiplatelet/Anticoagulant:  Yes.   Plavix   ASSESSMENT/PLAN:: 78 y.o. female s/p right femoral to PT trunk  bypass graft 10/12/13 returns for follow up scans.   -She is doing quite well this visit with a patent bypass graft -she continues to work on smoking cessation and is making good progress.  She is using adult coloring books and this is working for her. -she is not on a statin-will defer to her PCP whether or not to start.   -she does have faint bilateral carotid bruits.  She did have a CTA of the neck in June 2015, which revealed Irregular short-segment stenosis of approximately 70% within the proximal left internal carotid artery just distal to the carotid bifurcation. This stenosis measures approximately 5 mm in length.   -her left subclavian stent is patent -when she returns in April, we will get a carotid duplex as well as ABI's and an arterial duplex of her bypass graft. -she will call sooner if she has any problems  Leontine Locket, PA-C [facility] [phone]  Clinic MD:  Pt seen and examined in conjunction with Dr. Oneida Alar    History and physical exam findings as above. Patient has easily palpable dorsalis pedis pulse on the right side. Her graft is patent on duplex exam today. She does have bilateral carotid bruits. When she returns in 3 months for her graft duplex and ABIs we will also perform a carotid duplex scan. She does have a known history of moderate bilateral carotid stenosis in the past. She has also previously had  a left subclavian stent performed by Dr. Gwenlyn Found.  Ruta Hinds, MD [facility] [place] Office: [phone] Pager: [phone]

## 2014-06-19 NOTE — Addendum Note (Signed)
Addended by: Mena Goes on: 06/19/2014 05:30 PM   Modules accepted: Orders

## 2014-07-05 ENCOUNTER — Encounter: Payer: Self-pay | Admitting: Family Medicine

## 2014-07-05 ENCOUNTER — Ambulatory Visit (INDEPENDENT_AMBULATORY_CARE_PROVIDER_SITE_OTHER): Payer: Medicare Other | Admitting: Family Medicine

## 2014-07-05 VITALS — BP 135/86 | HR 95 | Temp 98.6°F | Ht 65.0 in | Wt 184.0 lb

## 2014-07-05 DIAGNOSIS — E119 Type 2 diabetes mellitus without complications: Secondary | ICD-10-CM

## 2014-07-05 DIAGNOSIS — I10 Essential (primary) hypertension: Secondary | ICD-10-CM | POA: Diagnosis not present

## 2014-07-05 DIAGNOSIS — I2510 Atherosclerotic heart disease of native coronary artery without angina pectoris: Secondary | ICD-10-CM | POA: Diagnosis not present

## 2014-07-05 MED ORDER — HYDROCODONE-ACETAMINOPHEN 10-325 MG PO TABS: 1.0000 | ORAL_TABLET | Freq: Four times a day (QID) | ORAL | Status: DC | PRN

## 2014-07-05 NOTE — Progress Notes (Signed)
   Subjective:    Patient ID: Sydney Phillips, female    DOB: 10-Jun-1936, 79 y.o.   MRN: [id number]  HPI Here to follow up. She feels well in general. The potassium supplement has helped her muscle cramps quite a bit. Her BP is stable at home. She has made a serious effort to get her glucose levels down by adjusting her diet. She has stopped drinking so many sodas a day. Her last A1c on 05-02-14 was 7.1.    Review of Systems  Constitutional: Negative.   Respiratory: Negative.   Cardiovascular: Negative.        Objective:   Physical Exam  Constitutional: She appears well-developed and well-nourished.  Walking well with just a single point cane   Cardiovascular: Normal rate, regular rhythm, normal heart sounds and intact distal pulses.   Pulmonary/Chest: Effort normal and breath sounds normal.          Assessment & Plan:  Doing well. I wrote for her to get her own glucometer so she can track her glucoses at home. We will get another A1c in late February. BP is stable.

## 2014-07-05 NOTE — Progress Notes (Signed)
Pre visit review using our clinic review tool, if applicable. No additional management support is needed unless otherwise documented below in the visit note. 

## 2014-07-16 ENCOUNTER — Encounter: Payer: Self-pay | Admitting: Cardiovascular Disease

## 2014-07-16 DIAGNOSIS — I255 Ischemic cardiomyopathy: Secondary | ICD-10-CM | POA: Diagnosis not present

## 2014-07-17 ENCOUNTER — Ambulatory Visit (INDEPENDENT_AMBULATORY_CARE_PROVIDER_SITE_OTHER): Payer: Medicare Other | Admitting: *Deleted

## 2014-07-17 DIAGNOSIS — I255 Ischemic cardiomyopathy: Secondary | ICD-10-CM

## 2014-07-17 NOTE — Progress Notes (Signed)
Remote ICD transmission.   

## 2014-07-18 LAB — MDC_IDC_ENUM_SESS_TYPE_REMOTE
Battery Voltage: 3.06 V
Brady Statistic AP VP Percent: 1.16 %
Brady Statistic AP VS Percent: 0.03 %
Brady Statistic AS VP Percent: 98.22 %
Brady Statistic AS VS Percent: 0.58 %
Brady Statistic RA Percent Paced: 1.2 %
Brady Statistic RV Percent Paced: 99.38 %
Date Time Interrogation Session: 20160131205408
HighPow Impedance: 55 Ohm
HighPow Impedance: 60 Ohm
HighPow Impedance: 893 Ohm
Lead Channel Impedance Value: 380 Ohm
Lead Channel Impedance Value: 437 Ohm
Lead Channel Impedance Value: 456 Ohm
Lead Channel Impedance Value: 665 Ohm
Lead Channel Impedance Value: 912 Ohm
Lead Channel Pacing Threshold Amplitude: 0.5 V
Lead Channel Pacing Threshold Amplitude: 0.625 V
Lead Channel Pacing Threshold Amplitude: 1.75 V
Lead Channel Pacing Threshold Pulse Width: 0.4 ms
Lead Channel Pacing Threshold Pulse Width: 0.4 ms
Lead Channel Pacing Threshold Pulse Width: 1 ms
Lead Channel Sensing Intrinsic Amplitude: 28.625 mV
Lead Channel Sensing Intrinsic Amplitude: 3.75 mV
Lead Channel Setting Pacing Amplitude: 1.5 V
Lead Channel Setting Pacing Amplitude: 2 V
Lead Channel Setting Pacing Amplitude: 2.5 V
Lead Channel Setting Pacing Pulse Width: 0.4 ms
Lead Channel Setting Pacing Pulse Width: 1 ms
Lead Channel Setting Sensing Sensitivity: 0.3 mV
Zone Setting Detection Interval: 320 ms
Zone Setting Detection Interval: 350 ms
Zone Setting Detection Interval: 360 ms
Zone Setting Detection Interval: 360 ms

## 2014-07-25 ENCOUNTER — Ambulatory Visit (INDEPENDENT_AMBULATORY_CARE_PROVIDER_SITE_OTHER): Payer: Medicare Other | Admitting: Family Medicine

## 2014-07-25 ENCOUNTER — Encounter: Payer: Self-pay | Admitting: Family Medicine

## 2014-07-25 VITALS — BP 150/78 | HR 72 | Temp 98.9°F | Ht 65.0 in | Wt 184.0 lb

## 2014-07-25 DIAGNOSIS — D179 Benign lipomatous neoplasm, unspecified: Secondary | ICD-10-CM | POA: Diagnosis not present

## 2014-07-25 NOTE — Progress Notes (Signed)
Pre visit review using our clinic review tool, if applicable. No additional management support is needed unless otherwise documented below in the visit note. 

## 2014-07-26 ENCOUNTER — Encounter: Payer: Self-pay | Admitting: Cardiology

## 2014-07-26 ENCOUNTER — Encounter: Payer: Self-pay | Admitting: Family Medicine

## 2014-07-26 NOTE — Progress Notes (Signed)
   Subjective:    Patient ID: Sydney Phillips, female    DOB: [birth year], 79 y.o.   MRN: [id number]  HPI She asks me to check a small cluster of lumps under the skin at the left waist area, she discovered these a few days ago when taking off a pair of pants. She does not know how long they have been there, and they do not bother her.    Review of Systems  Constitutional: Negative.   Gastrointestinal: Negative.        Objective:   Physical Exam  Constitutional: She appears well-developed and well-nourished.  Skin:  There is a small cluster of small firm mobile lumps under the skin at the left waistline           Assessment & Plan:  These are lipomas, and she was reassured that they are benign. No treatment is needed.  Recheck prn

## 2014-08-07 ENCOUNTER — Encounter: Payer: Medicare Other | Admitting: Cardiovascular Disease

## 2014-08-14 ENCOUNTER — Encounter: Payer: Self-pay | Admitting: Family Medicine

## 2014-08-14 ENCOUNTER — Ambulatory Visit (INDEPENDENT_AMBULATORY_CARE_PROVIDER_SITE_OTHER): Payer: Medicare Other | Admitting: Family Medicine

## 2014-08-14 VITALS — BP 162/60 | HR 92 | Temp 97.7°F | Wt 182.8 lb

## 2014-08-14 DIAGNOSIS — F329 Major depressive disorder, single episode, unspecified: Secondary | ICD-10-CM

## 2014-08-14 DIAGNOSIS — H9319 Tinnitus, unspecified ear: Secondary | ICD-10-CM

## 2014-08-14 DIAGNOSIS — F32A Depression, unspecified: Secondary | ICD-10-CM

## 2014-08-14 MED ORDER — VENLAFAXINE HCL ER 150 MG PO CP24: 150.0000 mg | ORAL_CAPSULE | Freq: Every day | ORAL | Status: DC

## 2014-08-14 NOTE — Progress Notes (Signed)
   Subjective:    Patient ID: Sydney Phillips, female    DOB: July 15, 1935, 79 y.o.   MRN: [id number]  HPI Here asking about a ringing sensation in the head and about changing medications. She has experienced a mild ringing or "whooshing" sensation in the head for about the past year but this only happens when she stops taking her Cymbalta. She never has this when she takes the Cymbalta regularly, but if she misses it for 2 or 3 days the ringing comes back. No HA or blurred vision. She wants to stop taking Cymbalta because it is too expensive.    Review of Systems  Constitutional: Negative.   HENT: Positive for tinnitus. Negative for congestion, ear pain and hearing loss.   Neurological: Negative.        Objective:   Physical Exam  Constitutional: She is oriented to person, place, and time. She appears well-developed and well-nourished. No distress.  Cardiovascular: Normal rate, regular rhythm, normal heart sounds and intact distal pulses.   Pulmonary/Chest: Effort normal and breath sounds normal.  Neurological: She is alert and oriented to person, place, and time. No cranial nerve deficit.  Psychiatric: She has a normal mood and affect. Her behavior is normal. Judgment and thought content normal.          Assessment & Plan:  I cannot explain how Cymbalta helps to control her tinnitus but I wonder if this could be a class effect of the SNRIs. We will switch to Venlafaxine XL 150 mg once daily. Her moods have been stable so hopefully these will remain under control.

## 2014-08-14 NOTE — Progress Notes (Signed)
Pre visit review using our clinic review tool, if applicable. No additional management support is needed unless otherwise documented below in the visit note. 

## 2014-08-15 ENCOUNTER — Other Ambulatory Visit: Payer: Self-pay | Admitting: Family Medicine

## 2014-08-15 MED ORDER — VENLAFAXINE HCL ER 150 MG PO CP24: 150.0000 mg | ORAL_CAPSULE | Freq: Every day | ORAL | Status: DC

## 2014-08-15 NOTE — Telephone Encounter (Signed)
rx sent in electronically 

## 2014-08-15 NOTE — Telephone Encounter (Signed)
Pt would like to know if dr fry could call in the rx pt was given yesterday/ pt was given a printed rx, but doesn't want to wait at the pharm for them to fill when she goes.  She wants it ready when she gets there.  venlafaxine XR (EFFEXOR XR) 150 MG 24 hr capsule [facility]

## 2014-08-17 ENCOUNTER — Telehealth: Payer: Self-pay | Admitting: Family Medicine

## 2014-08-17 NOTE — Telephone Encounter (Signed)
Pt said she can not take effexor . It make hers nervous, hyper and can not sleep

## 2014-08-18 NOTE — Telephone Encounter (Signed)
I spoke with pt and she does not want to try the new medication right now. She is going to give the Effexor a little more time, she is feeling better today.

## 2014-08-18 NOTE — Telephone Encounter (Signed)
Stop Effexor and call in Prozac 40 mg too take once a day, #30 with 5 rf

## 2014-08-30 ENCOUNTER — Telehealth: Payer: Self-pay | Admitting: Family Medicine

## 2014-08-30 NOTE — Telephone Encounter (Signed)
Patient is requesting more lancets and test strips, she is completely out.  She said she was testing 7 times per day and didn't realize it was only suppose to be 1 time per day.  She said she was given them in January.   [place], [place]

## 2014-08-31 NOTE — Telephone Encounter (Signed)
I spoke with pharmacy and pt's insurance will not cover right now. I left a voice message with this information, advised to find out when it will go through on insurance and also she has the option of paying out of pocket. Pt should only be testing once per day. ( Accu chek smart & Fastclix lancets.

## 2014-09-08 ENCOUNTER — Other Ambulatory Visit: Payer: Self-pay | Admitting: Family Medicine

## 2014-09-11 NOTE — Telephone Encounter (Signed)
Pt was last seen here in office on 08/14/14.

## 2014-09-12 NOTE — Telephone Encounter (Signed)
pcp NA  Can rx ambien 20 #    And have dr Sarajane Jews do further refills evaluation.

## 2014-09-27 ENCOUNTER — Encounter: Payer: Self-pay | Admitting: Family Medicine

## 2014-09-27 ENCOUNTER — Ambulatory Visit (INDEPENDENT_AMBULATORY_CARE_PROVIDER_SITE_OTHER): Payer: Medicare Other | Admitting: Family Medicine

## 2014-09-27 VITALS — BP 166/87 | HR 80 | Temp 98.1°F | Ht 65.0 in | Wt 182.0 lb

## 2014-09-27 DIAGNOSIS — J209 Acute bronchitis, unspecified: Secondary | ICD-10-CM | POA: Diagnosis not present

## 2014-09-27 MED ORDER — HYDROCODONE-ACETAMINOPHEN 10-325 MG PO TABS: 1.0000 | ORAL_TABLET | Freq: Four times a day (QID) | ORAL | Status: DC | PRN

## 2014-09-27 MED ORDER — HYDROCOD POLST-CHLORPHEN POLST 10-8 MG/5ML PO LQCR: 5.0000 mL | Freq: Two times a day (BID) | ORAL | Status: DC | PRN

## 2014-09-27 MED ORDER — METHYLPREDNISOLONE ACETATE 80 MG/ML IJ SUSP
120.0000 mg | Freq: Once | INTRAMUSCULAR | Status: AC
  Administered 2014-09-27: 120 mg via INTRAMUSCULAR

## 2014-09-27 MED ORDER — DOXYCYCLINE HYCLATE 100 MG PO CAPS: 100.0000 mg | ORAL_CAPSULE | Freq: Two times a day (BID) | ORAL | Status: AC

## 2014-09-27 NOTE — Addendum Note (Signed)
Addended by: Aggie Hacker A on: 09/27/2014 04:18 PM   Modules accepted: Orders

## 2014-09-27 NOTE — Progress Notes (Signed)
   Subjective:    Patient ID: Sydney Phillips, female    DOB: [birth year], 79 y.o.   MRN: [id number]  HPI Here for 4 days of chest tightness and coughing up yellow sputum. She is SOB and very fatigued from lack of sleep. No fever.    Review of Systems  Constitutional: Positive for fatigue. Negative for fever and chills.  HENT: Negative.   Eyes: Negative.   Respiratory: Positive for cough, chest tightness and shortness of breath. Negative for wheezing.   Cardiovascular: Negative.        Objective:   Physical Exam  Constitutional: She appears well-developed and well-nourished.  HENT:  Left Ear: External ear normal.  Nose: Nose normal.  Mouth/Throat: Oropharynx is clear and moist.  Eyes: Conjunctivae are normal.  Neck: No thyromegaly present.  Pulmonary/Chest: Effort normal. No respiratory distress. She has no wheezes. She has no rales.  scattered rhonchi   Lymphadenopathy:    She has no cervical adenopathy.          Assessment & Plan:  Bronchitis. Given a steroid shot along with Doxycycline. Add Mucinex

## 2014-09-27 NOTE — Progress Notes (Signed)
Pre visit review using our clinic review tool, if applicable. No additional management support is needed unless otherwise documented below in the visit note. 

## 2014-10-06 ENCOUNTER — Other Ambulatory Visit: Payer: Self-pay | Admitting: Internal Medicine

## 2014-10-09 NOTE — Telephone Encounter (Signed)
Call in #30 with 5 rf 

## 2014-10-11 ENCOUNTER — Encounter: Payer: Self-pay | Admitting: Vascular Surgery

## 2014-10-12 ENCOUNTER — Other Ambulatory Visit: Payer: Self-pay | Admitting: Vascular Surgery

## 2014-10-12 ENCOUNTER — Ambulatory Visit (HOSPITAL_COMMUNITY)
Admission: RE | Admit: 2014-10-12 | Discharge: 2014-10-12 | Disposition: A | Discharge status: Home / Self Care | Payer: Medicare Other | Source: Ambulatory Visit | Attending: Vascular Surgery | Admitting: Vascular Surgery

## 2014-10-12 ENCOUNTER — Ambulatory Visit (INDEPENDENT_AMBULATORY_CARE_PROVIDER_SITE_OTHER): Payer: Medicare Other | Admitting: Vascular Surgery

## 2014-10-12 ENCOUNTER — Encounter: Payer: Self-pay | Admitting: Vascular Surgery

## 2014-10-12 ENCOUNTER — Ambulatory Visit (INDEPENDENT_AMBULATORY_CARE_PROVIDER_SITE_OTHER)
Admission: RE | Admit: 2014-10-12 | Discharge: 2014-10-12 | Disposition: A | Discharge status: Home / Self Care | Payer: Medicare Other | Source: Ambulatory Visit | Attending: Vascular Surgery | Admitting: Vascular Surgery

## 2014-10-12 VITALS — BP 96/61 | HR 70 | Ht 65.0 in | Wt 182.5 lb

## 2014-10-12 DIAGNOSIS — I739 Peripheral vascular disease, unspecified: Secondary | ICD-10-CM

## 2014-10-12 DIAGNOSIS — I6523 Occlusion and stenosis of bilateral carotid arteries: Secondary | ICD-10-CM | POA: Diagnosis not present

## 2014-10-12 DIAGNOSIS — Z01810 Encounter for preprocedural cardiovascular examination: Secondary | ICD-10-CM

## 2014-10-12 DIAGNOSIS — Z48812 Encounter for surgical aftercare following surgery on the circulatory system: Secondary | ICD-10-CM | POA: Diagnosis not present

## 2014-10-12 NOTE — Progress Notes (Signed)
    History of Present Illness  Sydney Phillips is a 79 y.o. (04-Apr-1936) female who presents for continued follow-up s/p right femoral-tibioperoneal trunk bypass graft 10/12/13 and carotid stenosis. The patient is able to ambulate longer distances now. She denies any pain in her legs. She did sustain a fall yesterday that was due to losing her balance. In regards to her carotid stenosis, she denies any amaurosis fugax, hemiplegia, facial droop, receptive or expressive aphasia.  She continues to smoke but is trying to cut back. She takes plavix. She is intolerant of statins.   \ Physical Examination  Filed Vitals:   10/12/14 1615 10/12/14 1618  BP: 143/71 96/61  Pulse: 70   Height: 5\' 5"  (1.651 m)   Weight: 182 lb 8 oz (82.781 kg)   SpO2: 95%    Body mass index is 30.37 kg/(m^2).  General: A&O x 3, WDWN female in NAD  Pulmonary: Sym exp, good air movt, CTAB, no rales, rhonchi, & wheezing  Cardiac: RRR, Nl S1, S2, no Murmurs, rubs or gallops, left carotid bruit  Vascular: 2+ radial pulses b/l, 1+ dorsalis pedis pulses b/l, 1+ left posterior tibial pulse.  Musculoskeletal: M/S 5/5 throughout. Extremities without ischemic changes  Neurologic: CN II-XII grossly intact. Pain and light touch intact in extremitiesMotor exam as listed above  Non-Invasive Vascular Imaging ABI (Date: 10/12/2014) R: 1.0  TBI: <.69 L: 0.71 TBI: <.69  Carotid Duplex (Date: 10/12/2014)  Right ICA stenosis: 50-69%  Left ICA stenosis: 80-99% with velocity of 605 cm/s at proximal ICA  Vertebral artery antegrade right, retrograde flow left  Medical Decision Making  RICKIE GUTIERRES is a 79 y.o. female who is s/p right femoral-tibioperoneal trunk bypass graft 10/12/13 and asymptomatic left internal carotid artery stenosis 80-99%.   Her right leg bypass is patent and she has a palpable dorsalis pedis pulse. In regards to her carotid disease, she will need to have a left carotid endarterectomy. She must  undergo cardiac clearance first. We will schedule surgery following her cardiac clearance. She is currently on plavix. She was counseled on smoking cessation.   Virgina Jock, PA-C [facility] [place] Office: [phone] Pager: [phone]  10/12/2014, 4:42 PM  This patient was seen in conjunction with Dr. Oneida Alar  History and exam findings as above. Patent right leg bypass. Patient now has an asymptomatic high-grade greater than 80% left internal carotid artery stenosis. She will undergo cardiac risk stratification. After her cardiac workup is complete we will consider her for left carotid endarterectomy. Risks benefits possible complications and procedure details of left carotid endarterectomy were discussed the patient today including but not limited to bleeding infection cranial nerve injury stroke risk of 1%.  Ruta Hinds, MD [facility] [place] Office: [phone] Pager: [phone]

## 2014-10-13 NOTE — Addendum Note (Signed)
Addended by: Mena Goes on: 10/13/2014 03:14 PM   Modules accepted: Orders

## 2014-10-17 ENCOUNTER — Encounter: Payer: Medicare Other | Admitting: Cardiovascular Disease

## 2014-10-18 ENCOUNTER — Ambulatory Visit (INDEPENDENT_AMBULATORY_CARE_PROVIDER_SITE_OTHER): Payer: Medicare Other | Admitting: Cardiovascular Disease

## 2014-10-18 ENCOUNTER — Encounter: Payer: Self-pay | Admitting: Cardiovascular Disease

## 2014-10-18 VITALS — BP 140/70 | HR 71 | Ht 65.5 in | Wt 181.8 lb

## 2014-10-18 DIAGNOSIS — Z95810 Presence of automatic (implantable) cardiac defibrillator: Secondary | ICD-10-CM | POA: Diagnosis not present

## 2014-10-18 DIAGNOSIS — I2510 Atherosclerotic heart disease of native coronary artery without angina pectoris: Secondary | ICD-10-CM

## 2014-10-18 DIAGNOSIS — I10 Essential (primary) hypertension: Secondary | ICD-10-CM

## 2014-10-18 DIAGNOSIS — I429 Cardiomyopathy, unspecified: Secondary | ICD-10-CM

## 2014-10-18 DIAGNOSIS — I255 Ischemic cardiomyopathy: Secondary | ICD-10-CM

## 2014-10-18 DIAGNOSIS — I739 Peripheral vascular disease, unspecified: Secondary | ICD-10-CM | POA: Diagnosis not present

## 2014-10-18 DIAGNOSIS — Z9889 Other specified postprocedural states: Secondary | ICD-10-CM

## 2014-10-18 DIAGNOSIS — Z95820 Peripheral vascular angioplasty status with implants and grafts: Secondary | ICD-10-CM

## 2014-10-18 NOTE — Patient Instructions (Signed)
Remote monitoring is used to monitor your Pacemaker or ICD from home. This monitoring reduces the number of office visits required to check your device to one time per year. It allows [place] to monitor the functioning of your device to ensure it is working properly. You are scheduled for a device check from home on December 19, 2014. You may send your transmission at any time that day. If you have a wireless device, the transmission will be sent automatically. After your physician reviews your transmission, you will receive a postcard with your next transmission date.  Dr. Sallyanne Kuster recommends that you schedule a follow-up appointment in: one year.

## 2014-10-19 ENCOUNTER — Other Ambulatory Visit: Payer: Self-pay | Admitting: Family Medicine

## 2014-10-19 NOTE — Progress Notes (Signed)
Patient ID: Sydney Phillips, female   DOB: 18-Apr-1936, 79 y.o.   MRN: [id number]      Cardiology Office Note   Date:  10/19/2014   ID:  Sydney Phillips, DOB 03/24/1936, MRN [id number]  PCP:  Laurey Morale, MD  Cardiologist:  Kirk Ruths, MD; [name], MD   Chief Complaint  Patient presents with  . Surgical Clearance    Carotid Endarterectomy by Dr Pershing Proud schedule pending. patient reports shortness of breath on exertion and dizziness      History of Present Illness: Sydney Phillips is a 79 y.o. female who presents for preoperative cardiac risk evaluation and CRT-D check. Carotid endarterectomy planned for 10/23/14.  She has a history of cardiomyopathy that was felt to be of mixed etiology. Her ejection fraction in 2009 was 20%. Catheterization revealed a 75% LAD and a 75% right coronary artery. She had a biventricular ICD placed at that time. Last echocardiogram in February of 2013 showed an ejection fraction of 64-40%, grade 1 diastolic dysfunction and mild mitral regurgitation. Myoview in March of 2012 showed an ejection fraction of 55% and normal perfusion. Patient also has peripheral vascular disease. Arteriogram in February 2013 showed a 70% right internal carotid artery stenosis. There was a 30-40% left carotid stenosis. There was a 90% left subclavian. Patient had stent of her left subclavian at that time. In August 2015 she had a low risk myocardial perfusion study with no ischemia and EF 56%. Similar LVEF by echo.  Her Medtronic Protecta CRT-D device was implanted in 2013. Normal check today. Generator voltage 3.04V (ERI 2.63V). 99.6% BiV paced, 1% A paced. Optivol was elevated in Feb-March ("had a sinus infection"), but this has self corrected for at least last 2 weeks. Her weight, usually 175-180 lb on home scale, had increased up to 189 lb at the time. No VT or AF recorded. Activity about 1.3 h/day, stable.  Reports NYHA class 2 exertional dyspnea, no angina.  Occasional dizziness.    Past Medical History  Diagnosis Date  . COPD (chronic obstructive pulmonary disease)   . Hx of colonic polyps   . Insomnia   . Fibromyalgia   . Hyperlipemia     takes Crestor daily  . CAD (coronary artery disease)     Currently angina free, no evidence of reversible ischemia  . Vertigo     takes Meclizine prn  . Subclavian arterial stenosis, lt, with PTA/STENT 07/31/11 08/01/2011  . S/P angioplasty with stent, lt. subclavian 07/31/11 08/01/2011  . Hypertension     takes Amlodipine and Metoprolol daily  . Bronchitis   . Arthritis   . Chronic back pain   . GERD (gastroesophageal reflux disease)     was on meds but was taken off;now watches what she eats  . Chronic constipation   . Hemorrhoids   . Urinary incontinence   . Early cataracts, bilateral   . Depression with anxiety     takes Cymbalta daily  . Congestive heart failure (CHF)     New York Heart Association functional class 2, diastolic dysfunction  . PAD (peripheral artery disease)     Carotid, subclavian, and lower extremity beds, currently not symptomatic  . Syncope 07/28/2011    EF - 50-55, moderate concentric hypertrophy in left ventricle  . LBBB (left bundle branch block)     Stress test 09/03/2010, EF 55  . Nephrolithiasis   . Automatic implantable cardioverter-defibrillator in situ   . Presence of combination internal cardiac defibrillator (ICD) and  pacemaker   . Shortness of breath     when over exerting self per pt.  . Colon polyps 2003.  2015.    HP polyps 2003.  adnomas 2015.  required referal to [facility] for colonoscopic resection of flat polyps.     Past Surgical History  Procedure Laterality Date  . Appendectomy    . Tonsillectomy    . Tubal ligation    . Cardiac defibrillator placement  05/2008    By Dr Blanch Media, [employer] CANNOT HAVE MRI's  . Abdominal hysterectomy    . Back surgery  2012  . Orif elbow fracture  08/16/2011    Procedure: OPEN REDUCTION INTERNAL FIXATION  (ORIF) ELBOW/OLECRANON FRACTURE;  Surgeon: Schuyler Amor, MD;  Location: [place];  Service: Orthopedics;  Laterality: Left;  . Cardiac catheterization  12/01/2007    By Dr. Melvern Banker, left heart cath,   . Coronary angioplasty    . Subclavian stent placement Left 07/31/2011    7x18 Genesis, balloon, with reduction of 90% ostial left subclavian artery stenosis to 0% with residual excellent flow  . Femoral-popliteal bypass graft Right 10/12/2013    Procedure:   Femoral-Peroneal trunk  bypass with nonreversed greater saphenous vein graft;  Surgeon: Elam Dutch, MD;  Location: [place];  Service: Vascular;  Laterality: Right;  . Intraoperative arteriogram Right 10/12/2013    Procedure: INTRA OPERATIVE ARTERIOGRAM;  Surgeon: Elam Dutch, MD;  Location: [place];  Service: Vascular;  Laterality: Right;  . Colonoscopy w/ polypectomy  12/2013  . Esophagogastroduodenoscopy N/A 01/20/2014    Procedure: ESOPHAGOGASTRODUODENOSCOPY (EGD);  Surgeon: Jerene Bears, MD;  Location: [facility] ENDOSCOPY;  Service: Endoscopy;  Laterality: N/A;  . Givens capsule study N/A 01/20/2014    Procedure: GIVENS CAPSULE STUDY;  Surgeon: Jerene Bears, MD;  Location: [place];  Service: Gastroenterology;  Laterality: N/A;  . Carotid angiogram N/A 07/31/2011    Procedure: CAROTID ANGIOGRAM;  Surgeon: Lorretta Harp, MD;  Location: [facility] CATH LAB;  Service: Cardiovascular;  Laterality: N/A;  carotid angiogram and possible Lt SCA PTA  . Biv icd genertaor change out Left 02/20/2012    Procedure: BIV ICD GENERTAOR CHANGE OUT;  Surgeon: [name], MD;  Location: [facility] CATH LAB;  Service: Cardiovascular;  Laterality: Left;  . Abdominal aortagram N/A 08/12/2013    Procedure: ABDOMINAL Maxcine Ham;  Surgeon: Elam Dutch, MD;  Location: [facility] CATH LAB;  Service: Cardiovascular;  Laterality: N/A;  . Renal angiogram N/A 08/12/2013    Procedure: RENAL ANGIOGRAM;  Surgeon: Elam Dutch, MD;  Location: [facility] CATH LAB;  Service: Cardiovascular;   Laterality: N/A;     Current Outpatient Prescriptions  Medication Sig Dispense Refill  . ALPRAZolam (XANAX) 1 MG tablet Take 1 tablet (1 mg total) by mouth every 6 (six) hours as needed for anxiety. 120 tablet 5  . clopidogrel (PLAVIX) 75 MG tablet Take 1 tablet (75 mg total) by mouth daily. 90 tablet 3  . DULoxetine (CYMBALTA) 60 MG capsule Take 60 mg by mouth 2 (two) times daily.    Marland Kitchen HYDROcodone-acetaminophen (NORCO) 10-325 MG per tablet Take 1 tablet by mouth every 6 (six) hours as needed for moderate pain. 120 tablet 0  . lisinopril (PRINIVIL,ZESTRIL) 20 MG tablet Take 1 tablet (20 mg total) by mouth daily. 30 tablet 6  . meclizine (ANTIVERT) 25 MG tablet Take 1 tablet (25 mg total) by mouth every 4 (four) hours as needed for dizziness. 60 tablet 5  . metoprolol tartrate (LOPRESSOR) 25 MG tablet  Take 1 tablet (25 mg total) by mouth daily. 30 tablet 11  . nitroGLYCERIN (NITROSTAT) 0.4 MG SL tablet Place 1 tablet (0.4 mg total) under the tongue every 5 (five) minutes x 3 doses as needed for chest pain. 30 tablet 0  . polyethylene glycol (MIRALAX / GLYCOLAX) packet Take 17 g by mouth daily as needed for mild constipation. 30 each 11  . zolpidem (AMBIEN) 10 MG tablet TAKE ONE TABLET BY MOUTH AT BEDTIME 30 tablet 5  . furosemide (LASIX) 80 MG tablet TAKE ONE TABLET BY MOUTH TWICE DAILY 180 tablet 3   No current facility-administered medications for this visit.    Allergies:   Codeine; Darvon; Meloxicam; Potassium-containing compounds; Propoxyphene n-acetaminophen; Rofecoxib; Statins; Azithromycin; Erythromycin; Penicillins; and Sulfa antibiotics    Social History:  The patient  reports that she has been smoking Cigarettes.  She has smoked for the past 60 years. She has never used smokeless tobacco. She reports that she does not drink alcohol or use illicit drugs.   Family History:  The patient's family history includes CAD in her father; Cancer in her sister; Colon cancer in her maternal  grandmother; Deep vein thrombosis in her son; Diabetes in her sister; Heart disease in her father, mother, and sister; Hyperlipidemia in her father and another family member. There is no history of Anesthesia problems, Hypotension, Malignant hyperthermia, or Pseudochol deficiency.    ROS:  Please see the history of present illness.    Otherwise, review of systems positive for none.   All other systems are reviewed and negative.    PHYSICAL EXAM: VS:  BP 140/70 mmHg  Pulse 71  Ht 5' 5.5" (1.664 m)  Wt 181 lb 12.8 oz (82.464 kg)  BMI 29.78 kg/m2 , BMI Body mass index is 29.78 kg/(m^2).  General: Alert, oriented x3, no distress Head: no evidence of trauma, PERRL, EOMI, no exophtalmos or lid lag, no myxedema, no xanthelasma; normal ears, nose and oropharynx Neck: normal jugular venous pulsations and no hepatojugular reflux; brisk carotid pulses without delay and no carotid bruits Chest: clear to auscultation, no signs of consolidation by percussion or palpation, normal fremitus, symmetrical and full respiratory excursions Cardiovascular: normal position and quality of the apical impulse, regular rhythm, normal first and second heart sounds, no murmurs, rubs or gallops Abdomen: no tenderness or distention, no masses by palpation, no abnormal pulsatility or arterial bruits, normal bowel sounds, no hepatosplenomegaly Extremities: no clubbing, cyanosis or edema; 2+ radial, ulnar and brachial pulses bilaterally; 2+ right femoral, posterior tibial and dorsalis pedis pulses; 2+ left femoral, posterior tibial and dorsalis pedis pulses; no subclavian or femoral bruits Neurological: grossly nonfocal Psych: euthymic mood, full affect   EKG:  EKG is ordered today. The ekg ordered today demonstrates A sensed, BiV paced   Recent Labs: 01/19/2014: ALT 8; Pro B Natriuretic peptide (BNP) 185.1 01/23/2014: BUN 13; Creatinine 0.94; Potassium 3.4*; Sodium 144 01/26/2014: Hemoglobin 10.4*; Platelets 445.0*     Lipid Panel    Component Value Date/Time   CHOL 200 01/21/2014 0541   TRIG 126 01/21/2014 0541   HDL 27* 01/21/2014 0541   CHOLHDL 7.4 01/21/2014 0541   VLDL 25 01/21/2014 0541   LDLCALC 148* 01/21/2014 0541      Wt Readings from Last 3 Encounters:  10/18/14 181 lb 12.8 oz (82.464 kg)  10/12/14 182 lb 8 oz (82.781 kg)  09/27/14 182 lb (82.555 kg)     ASSESSMENT AND PLAN:  1. Normal CRT-D device function. Potential for electrocautery interference  at time of surgery, appropriate reprogramming before surgery is necessary (tachy detection off).  2. Mixed ischemic/nonischemic cardiomyopathy - appears euvolemic by clinical criteria and thoracic impedance. Fluid accumulation in March has resolved. Normal LVEF by most recent assessment. Optimal fluid weight by her home scale seems to be < 180 lb or so.  3. CAD -  No recent angina, low risk recent nuclear scan  4. Low-to-moderate risk of major complications with planned carotid surgery. Continue beta blockers without interruption periop. Avoid excessive fluids.  5. Tobacco abuse - reports strong intentions to quit smoking. Asks about nicotine patches. Does not want Chantix. Encouraged to pursue this as soon and as possible.   Current medicines are reviewed at length with the patient today.  The patient does not have concerns regarding medicines.  The following changes have been made:  no change  Labs/ tests ordered today include:  Orders Placed This Encounter  Procedures  . EKG 12-Lead    Patient Instructions  Remote monitoring is used to monitor your Pacemaker or ICD from home. This monitoring reduces the number of office visits required to check your device to one time per year. It allows [place] to monitor the functioning of your device to ensure it is working properly. You are scheduled for a device check from home on December 19, 2014. You may send your transmission at any time that day. If you have a wireless device, the  transmission will be sent automatically. After your physician reviews your transmission, you will receive a postcard with your next transmission date.  Dr. Sallyanne Kuster recommends that you schedule a follow-up appointment in: one year.       Mikael Spray, MD  10/19/2014 9:19 PM    [name], MD, [facility] [phone] office [phone] pager

## 2014-10-25 LAB — CUP PACEART INCLINIC DEVICE CHECK
Brady Statistic AP VS Percent: 0.1 % — CL
Date Time Interrogation Session: 20160511111721
Lead Channel Impedance Value: 513 Ohm
Lead Channel Pacing Threshold Amplitude: 1.875 V
Lead Channel Pacing Threshold Pulse Width: 0.4 ms
Lead Channel Pacing Threshold Pulse Width: 1 ms
Lead Channel Setting Pacing Amplitude: 1.5 V
Lead Channel Setting Pacing Amplitude: 2.5 V
Lead Channel Setting Pacing Pulse Width: 0.4 ms
Lead Channel Setting Sensing Sensitivity: 0.3 mV
MDC IDC MSMT BATTERY VOLTAGE: 3.04 V
MDC IDC MSMT LEADCHNL LV IMPEDANCE VALUE: 456 Ohm
MDC IDC MSMT LEADCHNL RA PACING THRESHOLD AMPLITUDE: 0.5 V
MDC IDC MSMT LEADCHNL RA PACING THRESHOLD PULSEWIDTH: 0.4 ms
MDC IDC MSMT LEADCHNL RA SENSING INTR AMPL: 4.9 mV
MDC IDC MSMT LEADCHNL RV IMPEDANCE VALUE: 1045 Ohm
MDC IDC MSMT LEADCHNL RV PACING THRESHOLD AMPLITUDE: 0.625 V
MDC IDC MSMT LEADCHNL RV SENSING INTR AMPL: 20 mV
MDC IDC SET LEADCHNL LV PACING PULSEWIDTH: 1 ms
MDC IDC SET LEADCHNL RV PACING AMPLITUDE: 2 V
MDC IDC SET ZONE DETECTION INTERVAL: 320 ms
MDC IDC STAT BRADY AP VP PERCENT: 1 %
MDC IDC STAT BRADY AS VP PERCENT: 98.6 %
MDC IDC STAT BRADY AS VS PERCENT: 0.3 %
Zone Setting Detection Interval: 350 ms
Zone Setting Detection Interval: 360 ms
Zone Setting Detection Interval: 360 ms

## 2014-10-27 ENCOUNTER — Other Ambulatory Visit: Payer: Self-pay

## 2014-10-28 ENCOUNTER — Other Ambulatory Visit: Payer: Self-pay | Admitting: Family Medicine

## 2014-10-31 ENCOUNTER — Encounter: Payer: Self-pay | Admitting: Cardiovascular Disease

## 2014-10-31 ENCOUNTER — Other Ambulatory Visit: Payer: Self-pay | Admitting: Family Medicine

## 2014-10-31 NOTE — Telephone Encounter (Signed)
Call in #120 with 5 rf 

## 2014-11-03 ENCOUNTER — Encounter (HOSPITAL_COMMUNITY)
Admission: RE | Admit: 2014-11-03 | Discharge: 2014-11-03 | Disposition: A | Discharge status: Home / Self Care | Payer: Medicare Other | Source: Ambulatory Visit | Attending: Vascular Surgery | Admitting: Vascular Surgery

## 2014-11-03 ENCOUNTER — Encounter (HOSPITAL_COMMUNITY): Payer: Self-pay

## 2014-11-03 DIAGNOSIS — E119 Type 2 diabetes mellitus without complications: Secondary | ICD-10-CM | POA: Diagnosis not present

## 2014-11-03 DIAGNOSIS — I2510 Atherosclerotic heart disease of native coronary artery without angina pectoris: Secondary | ICD-10-CM | POA: Diagnosis not present

## 2014-11-03 DIAGNOSIS — I5022 Chronic systolic (congestive) heart failure: Secondary | ICD-10-CM | POA: Diagnosis not present

## 2014-11-03 DIAGNOSIS — Z7902 Long term (current) use of antithrombotics/antiplatelets: Secondary | ICD-10-CM | POA: Insufficient documentation

## 2014-11-03 DIAGNOSIS — Z01812 Encounter for preprocedural laboratory examination: Secondary | ICD-10-CM | POA: Diagnosis not present

## 2014-11-03 DIAGNOSIS — I10 Essential (primary) hypertension: Secondary | ICD-10-CM | POA: Insufficient documentation

## 2014-11-03 DIAGNOSIS — Z95810 Presence of automatic (implantable) cardiac defibrillator: Secondary | ICD-10-CM | POA: Insufficient documentation

## 2014-11-03 DIAGNOSIS — Z01818 Encounter for other preprocedural examination: Secondary | ICD-10-CM | POA: Diagnosis not present

## 2014-11-03 DIAGNOSIS — M797 Fibromyalgia: Secondary | ICD-10-CM | POA: Diagnosis not present

## 2014-11-03 DIAGNOSIS — Z0183 Encounter for blood typing: Secondary | ICD-10-CM | POA: Insufficient documentation

## 2014-11-03 DIAGNOSIS — K219 Gastro-esophageal reflux disease without esophagitis: Secondary | ICD-10-CM | POA: Diagnosis not present

## 2014-11-03 DIAGNOSIS — J449 Chronic obstructive pulmonary disease, unspecified: Secondary | ICD-10-CM | POA: Diagnosis not present

## 2014-11-03 DIAGNOSIS — Z79899 Other long term (current) drug therapy: Secondary | ICD-10-CM | POA: Insufficient documentation

## 2014-11-03 DIAGNOSIS — E785 Hyperlipidemia, unspecified: Secondary | ICD-10-CM | POA: Insufficient documentation

## 2014-11-03 HISTORY — DX: Presence of cardiac pacemaker: Z95.0

## 2014-11-03 HISTORY — DX: Personal history of urinary calculi: Z87.442

## 2014-11-03 LAB — TYPE AND SCREEN
ABO/RH(D): B POS
Antibody Screen: NEGATIVE

## 2014-11-03 LAB — COMPREHENSIVE METABOLIC PANEL
ALBUMIN: 4.2 g/dL (ref 3.5–5.0)
ALT: 21 U/L (ref 14–54)
AST: 24 U/L (ref 15–41)
Alkaline Phosphatase: 98 U/L (ref 38–126)
Anion gap: 10 (ref 5–15)
BILIRUBIN TOTAL: 0.8 mg/dL (ref 0.3–1.2)
BUN: 12 mg/dL (ref 6–20)
CHLORIDE: 97 mmol/L — AB (ref 101–111)
CO2: 34 mmol/L — AB (ref 22–32)
CREATININE: 0.96 mg/dL (ref 0.44–1.00)
Calcium: 9.8 mg/dL (ref 8.9–10.3)
GFR, EST NON AFRICAN AMERICAN: 56 mL/min — AB (ref >60)
GLUCOSE: 107 mg/dL — AB (ref 65–99)
POTASSIUM: 3.7 mmol/L (ref 3.5–5.1)
Sodium: 141 mmol/L (ref 135–145)
Total Protein: 7.7 g/dL (ref 6.5–8.1)

## 2014-11-03 LAB — URINALYSIS, ROUTINE W REFLEX MICROSCOPIC
Bilirubin Urine: NEGATIVE
GLUCOSE, UA: NEGATIVE mg/dL
HGB URINE DIPSTICK: NEGATIVE
Ketones, ur: NEGATIVE mg/dL
Leukocytes, UA: NEGATIVE
Nitrite: NEGATIVE
PH: 6.5 (ref 5.0–8.0)
Protein, ur: NEGATIVE mg/dL
SPECIFIC GRAVITY, URINE: 1.009 (ref 1.005–1.030)
Urobilinogen, UA: 1 mg/dL (ref 0.0–1.0)

## 2014-11-03 LAB — SURGICAL PCR SCREEN
MRSA, PCR: NEGATIVE
STAPHYLOCOCCUS AUREUS: NEGATIVE

## 2014-11-03 LAB — CBC
HEMATOCRIT: 47.4 % — AB (ref 36.0–46.0)
Hemoglobin: 16.1 g/dL — ABNORMAL HIGH (ref 12.0–15.0)
MCH: 30.6 pg (ref 26.0–34.0)
MCHC: 34 g/dL (ref 30.0–36.0)
MCV: 89.9 fL (ref 78.0–100.0)
Platelets: 322 10*3/uL (ref 150–400)
RBC: 5.27 MIL/uL — ABNORMAL HIGH (ref 3.87–5.11)
RDW: 13.3 % (ref 11.5–15.5)
WBC: 10.6 10*3/uL — ABNORMAL HIGH (ref 4.0–10.5)

## 2014-11-03 LAB — GLUCOSE, CAPILLARY: Glucose-Capillary: 116 mg/dL — ABNORMAL HIGH (ref 65–99)

## 2014-11-03 LAB — PROTIME-INR
INR: 1.02 (ref 0.00–1.49)
PROTHROMBIN TIME: 13.6 s (ref 11.6–15.2)

## 2014-11-03 LAB — APTT: aPTT: 32 seconds (ref 24–37)

## 2014-11-03 NOTE — Pre-Procedure Instructions (Addendum)
Sydney Phillips  11/03/2014      [facility] [address], [place] Sydney [place] [address] Phone: [phone] Fax: [phone]    Your procedure is scheduled on 11/08/14.  Report to [facility] short stay admitting at 630 A.M.  Call this number if you have problems the morning of surgery:  [phone]   Remember:  Do not eat food or drink liquids after midnight.  Take these medicines the morning of surgery with A SIP OF WATER ( xanax, nitro, pain med if needed )      Metropolol,cymbalta    STOP all herbel meds, nsaids (aleve,naproxen,advil,ibuprofen) 5 days prior to surgery starting today including vitamins     PLAVIX per dr   Sydney Phillips not wear jewelry, make-up or nail polish.  Do not wear lotions, powders, or perfumes.  You may wear deodorant.  Do not shave 48 hours prior to surgery.  Men may shave face and neck.  Do not bring valuables to the hospital.  Sydney Phillips is not responsible for any belongings or valuables.  Contacts, dentures or bridgework may not be worn into surgery.  Leave your suitcase in the car.  After surgery it may be brought to your room.  For patients admitted to the hospital, discharge time will be determined by your treatment team.  Patients discharged the day of surgery will not be allowed to drive home.   Name and phone number of your driver:    Special instructions:   Special Instructions: [place] - Preparing for Surgery  Before surgery, you can play an important role.  Because skin is not sterile, your skin needs to be as free of germs as possible.  You can reduce the number of germs on you skin by washing with CHG (chlorahexidine gluconate) soap before surgery.  CHG is an antiseptic cleaner which kills germs and bonds with the skin to continue killing germs even after washing.  Please DO NOT use if you have an allergy to CHG or antibacterial soaps.  If your skin becomes reddened/irritated stop  using the CHG and inform your nurse when you arrive at Short Stay.  Do not shave (including legs and underarms) for at least 48 hours prior to the first CHG shower.  You may shave your face.  Please follow these instructions carefully:   1.  Shower with CHG Soap the night before surgery and the morning of Surgery.  2.  If you choose to wash your hair, wash your hair first as usual with your normal shampoo.  3.  After you shampoo, rinse your hair and body thoroughly to remove the Shampoo.  4.  Use CHG as you would any other liquid soap.  You can apply chg directly  to the skin and wash gently with scrungie or a clean washcloth.  5.  Apply the CHG Soap to your body ONLY FROM THE NECK DOWN.  Do not use on open wounds or open sores.  Avoid contact with your eyes ears, mouth and genitals (private parts).  Wash genitals (private parts)       with your normal soap.  6.  Wash thoroughly, paying special attention to the area where your surgery will be performed.  7.  Thoroughly rinse your body with warm water from the neck down.  8.  DO NOT shower/wash with your normal soap after using and rinsing off the CHG Soap.  9.  Pat yourself dry with  a clean towel.            10.  Wear clean pajamas.            11.  Place clean sheets on your bed the night of your first shower and do not sleep with pets.  Day of Surgery  Do not apply any lotions/deodorants the morning of surgery.  Please wear clean clothes to the hospital/surgery center.  Please read over the following fact sheets that you were given. Pain Booklet, Coughing and Deep Breathing, Blood Transfusion Information, MRSA Information and Surgical Site Infection Prevention

## 2014-11-06 ENCOUNTER — Ambulatory Visit: Payer: Medicare Other | Admitting: Cardiology

## 2014-11-06 NOTE — Progress Notes (Signed)
Anesthesia Chart Review:  Patient is a 79 year old female scheduled for left CEA on 11/08/14 by Dr. Oneida Alar.   History includes CAD, mixed cardiomyopathy, chronic systolic CHF, s/p AICD '09 (Dr. Tomie China with [facility]) wiith generator change out to [employer] Protecta CRT-D 02/20/12 (Dr. Sallyanne Kuster with [facility]), PVD s/p right CIA stent '12 s/p left subclavian stent 07/31/11 and right FPBG 10/12/13, smoking, COPD, DM2, HTN, GERD, fibromyalgia, HLD, depression, anxiety. PCP is Dr. Alysia Penna.  She has seen nephrologist Dr. Donato Heinz in the past for CKD stage II. Cardiologists are Dr. Kirk Ruths and Dr. [name].  She was seen by Dr. Sallyanne Kuster on 10/18/14 for a pre-operative evaluation and ICD interrogation. He felt she was low to moderate risk for major complications. Recommend continue b-blocker therapy and avoid excessive fluids. Recommend tachy detection be disabled pre-operatively.  Meds include Xanax, Plavix, Cymbalta, Lasix, Norco, lisinopril, Lopressor, Nitro, Ambien. Plavix on hold since 11/03/14.   10/18/14 EKG: atrial sensed ventricular paced rhythm.  01/30/14 Echo: Study Conclusions - Left ventricle: The cavity size was normal. Wall thickness was increased in a pattern of mild LVH. The estimated ejection fraction was 55%. - Mitral valve: There was mild regurgitation. - Left atrium: The atrium was mildly dilated. - Atrial septum: No defect or patent foramen ovale was identified. - Pulmonary arteries: PA peak pressure: 33 mm Hg (S). - Impressions: LVEDP elevated based on lateral annular E/E&'  01/23/14 Nuclear stress test: IMPRESSION: 1. Low risk Lexiscan nuclear stress test with no perfusion defect. Normal LVEF 56% with no regional wall motion abnormalities.  According to Dr. Victorino December note, "Her ejection fraction in 2009 was 20%. Catheterization [[12/01/07; Dr. Melvern Banker revealed a 75% LAD and a 75% right coronary artery. She had a biventricular ICD placed at that  time."  10/12/14 Carotid duplex: 50-69% RICA stenosis, 18-29% LICA stenosis, BECA > 50% stenosis, abnormal retrograde left VA with BP gradient present.  Preoperative labs noted. Cr 0.96. H/H 16.1/47.4.  If no acute changes then I anticipate that she can proceed as planned.  Nursing staff to notify the [employer] Rep.  George Hugh [facility] Short Stay Center/Anesthesiology Phone [phone] 11/06/2014 12:56 PM

## 2014-11-07 ENCOUNTER — Ambulatory Visit: Payer: Medicare Other | Admitting: Family Medicine

## 2014-11-07 MED ORDER — VANCOMYCIN HCL IN DEXTROSE 1-5 GM/200ML-% IV SOLN
1000.0000 mg | INTRAVENOUS | Status: AC
  Administered 2014-11-08: 1000 mg via INTRAVENOUS
  Filled 2014-11-07: qty 200

## 2014-11-07 NOTE — Progress Notes (Signed)
Call to [facility] with [employer], informed of pt. & the order for treatment of ICD- DOS. Time of surgery & reason for surgery furnished.

## 2014-11-08 ENCOUNTER — Encounter (HOSPITAL_COMMUNITY)
Admission: RE | Disposition: A | Discharge status: Home / Self Care | Payer: Self-pay | Source: Ambulatory Visit | Attending: Vascular Surgery

## 2014-11-08 ENCOUNTER — Encounter (HOSPITAL_COMMUNITY): Payer: Self-pay | Admitting: Certified Registered Nurse Anesthetist

## 2014-11-08 ENCOUNTER — Inpatient Hospital Stay (HOSPITAL_COMMUNITY)
Admission: RE | Admit: 2014-11-08 | Discharge: 2014-11-09 | DRG: 039 | Disposition: A | Discharge status: Home / Self Care | Payer: Medicare Other | Source: Ambulatory Visit | Attending: Vascular Surgery | Admitting: Vascular Surgery

## 2014-11-08 ENCOUNTER — Inpatient Hospital Stay (HOSPITAL_COMMUNITY): Payer: Medicare Other | Admitting: Vascular Surgery

## 2014-11-08 ENCOUNTER — Inpatient Hospital Stay (HOSPITAL_COMMUNITY): Payer: Medicare Other | Admitting: Certified Registered Nurse Anesthetist

## 2014-11-08 DIAGNOSIS — M549 Dorsalgia, unspecified: Secondary | ICD-10-CM | POA: Diagnosis present

## 2014-11-08 DIAGNOSIS — J449 Chronic obstructive pulmonary disease, unspecified: Secondary | ICD-10-CM | POA: Diagnosis present

## 2014-11-08 DIAGNOSIS — E1136 Type 2 diabetes mellitus with diabetic cataract: Secondary | ICD-10-CM | POA: Diagnosis not present

## 2014-11-08 DIAGNOSIS — E785 Hyperlipidemia, unspecified: Secondary | ICD-10-CM | POA: Diagnosis present

## 2014-11-08 DIAGNOSIS — K5900 Constipation, unspecified: Secondary | ICD-10-CM | POA: Diagnosis not present

## 2014-11-08 DIAGNOSIS — F418 Other specified anxiety disorders: Secondary | ICD-10-CM | POA: Diagnosis not present

## 2014-11-08 DIAGNOSIS — R29810 Facial weakness: Secondary | ICD-10-CM | POA: Diagnosis not present

## 2014-11-08 DIAGNOSIS — G4700 Insomnia, unspecified: Secondary | ICD-10-CM | POA: Diagnosis not present

## 2014-11-08 DIAGNOSIS — I10 Essential (primary) hypertension: Secondary | ICD-10-CM | POA: Diagnosis present

## 2014-11-08 DIAGNOSIS — K219 Gastro-esophageal reflux disease without esophagitis: Secondary | ICD-10-CM | POA: Diagnosis present

## 2014-11-08 DIAGNOSIS — Z79899 Other long term (current) drug therapy: Secondary | ICD-10-CM

## 2014-11-08 DIAGNOSIS — F17210 Nicotine dependence, cigarettes, uncomplicated: Secondary | ICD-10-CM | POA: Diagnosis not present

## 2014-11-08 DIAGNOSIS — I2510 Atherosclerotic heart disease of native coronary artery without angina pectoris: Secondary | ICD-10-CM | POA: Diagnosis present

## 2014-11-08 DIAGNOSIS — I6522 Occlusion and stenosis of left carotid artery: Secondary | ICD-10-CM | POA: Diagnosis not present

## 2014-11-08 DIAGNOSIS — G8929 Other chronic pain: Secondary | ICD-10-CM | POA: Diagnosis not present

## 2014-11-08 DIAGNOSIS — Z7902 Long term (current) use of antithrombotics/antiplatelets: Secondary | ICD-10-CM

## 2014-11-08 DIAGNOSIS — M797 Fibromyalgia: Secondary | ICD-10-CM | POA: Diagnosis not present

## 2014-11-08 HISTORY — PX: ENDARTERECTOMY: SHX5162

## 2014-11-08 LAB — CBC
HEMATOCRIT: 40.2 % (ref 36.0–46.0)
Hemoglobin: 13.3 g/dL (ref 12.0–15.0)
MCH: 30.3 pg (ref 26.0–34.0)
MCHC: 33.1 g/dL (ref 30.0–36.0)
MCV: 91.6 fL (ref 78.0–100.0)
PLATELETS: 233 10*3/uL (ref 150–400)
RBC: 4.39 MIL/uL (ref 3.87–5.11)
RDW: 13.6 % (ref 11.5–15.5)
WBC: 9.7 10*3/uL (ref 4.0–10.5)

## 2014-11-08 LAB — GLUCOSE, CAPILLARY
GLUCOSE-CAPILLARY: 132 mg/dL — AB (ref 65–99)
Glucose-Capillary: 134 mg/dL — ABNORMAL HIGH (ref 65–99)

## 2014-11-08 LAB — CREATININE, SERUM
Creatinine, Ser: 0.88 mg/dL (ref 0.44–1.00)
GFR calc non Af Amer: >60 mL/min (ref >60)

## 2014-11-08 SURGERY — ENDARTERECTOMY, CAROTID
Anesthesia: General | Site: Neck | Laterality: Left

## 2014-11-08 MED ORDER — ONDANSETRON HCL 4 MG/2ML IJ SOLN: 4.0000 mg | Freq: Four times a day (QID) | INTRAMUSCULAR | Status: DC | PRN

## 2014-11-08 MED ORDER — HYDRALAZINE HCL 20 MG/ML IJ SOLN: 5.0000 mg | INTRAMUSCULAR | Status: DC | PRN

## 2014-11-08 MED ORDER — ALPRAZOLAM 0.5 MG PO TABS: 1.0000 mg | ORAL_TABLET | Freq: Four times a day (QID) | ORAL | Status: DC | PRN

## 2014-11-08 MED ORDER — LIDOCAINE HCL (CARDIAC) 20 MG/ML IV SOLN
INTRAVENOUS | Status: AC
  Filled 2014-11-08: qty 10

## 2014-11-08 MED ORDER — LIDOCAINE HCL (CARDIAC) 20 MG/ML IV SOLN
INTRAVENOUS | Status: AC
  Filled 2014-11-08: qty 5

## 2014-11-08 MED ORDER — ONDANSETRON HCL 4 MG/2ML IJ SOLN
INTRAMUSCULAR | Status: DC | PRN
  Administered 2014-11-08: 4 mg via INTRAVENOUS

## 2014-11-08 MED ORDER — HYDROMORPHONE HCL 1 MG/ML IJ SOLN: 0.5000 mg | INTRAMUSCULAR | Status: DC | PRN

## 2014-11-08 MED ORDER — NITROGLYCERIN 0.4 MG SL SUBL: 0.4000 mg | SUBLINGUAL_TABLET | SUBLINGUAL | Status: DC | PRN

## 2014-11-08 MED ORDER — VANCOMYCIN HCL IN DEXTROSE 1-5 GM/200ML-% IV SOLN
1000.0000 mg | Freq: Two times a day (BID) | INTRAVENOUS | Status: AC
Start: 2014-11-08 — End: 2014-11-09
  Administered 2014-11-08 – 2014-11-09 (×2): 1000 mg via INTRAVENOUS
  Filled 2014-11-08 (×3): qty 200

## 2014-11-08 MED ORDER — GUAIFENESIN-DM 100-10 MG/5ML PO SYRP: 15.0000 mL | ORAL_SOLUTION | ORAL | Status: DC | PRN

## 2014-11-08 MED ORDER — GLYCOPYRROLATE 0.2 MG/ML IJ SOLN
INTRAMUSCULAR | Status: AC
  Filled 2014-11-08: qty 2

## 2014-11-08 MED ORDER — SODIUM CHLORIDE 0.9 % IV SOLN: INTRAVENOUS | Status: DC

## 2014-11-08 MED ORDER — CETYLPYRIDINIUM CHLORIDE 0.05 % MT LIQD: 7.0000 mL | Freq: Two times a day (BID) | OROMUCOSAL | Status: DC

## 2014-11-08 MED ORDER — PHENYLEPHRINE HCL 10 MG/ML IJ SOLN
10.0000 mg | INTRAVENOUS | Status: DC | PRN
  Administered 2014-11-08: 20 ug/min via INTRAVENOUS

## 2014-11-08 MED ORDER — FENTANYL CITRATE (PF) 250 MCG/5ML IJ SOLN
INTRAMUSCULAR | Status: AC
  Filled 2014-11-08: qty 5

## 2014-11-08 MED ORDER — FENTANYL CITRATE (PF) 100 MCG/2ML IJ SOLN
INTRAMUSCULAR | Status: DC | PRN
  Administered 2014-11-08 (×2): 50 ug via INTRAVENOUS
  Administered 2014-11-08: 100 ug via INTRAVENOUS
  Administered 2014-11-08: 50 ug via INTRAVENOUS

## 2014-11-08 MED ORDER — METOPROLOL TARTRATE 25 MG PO TABS
25.0000 mg | ORAL_TABLET | Freq: Every day | ORAL | Status: DC
  Administered 2014-11-09: 25 mg via ORAL
  Filled 2014-11-08: qty 1

## 2014-11-08 MED ORDER — LIDOCAINE HCL (CARDIAC) 20 MG/ML IV SOLN
INTRAVENOUS | Status: DC | PRN
  Administered 2014-11-08: 60 mg via INTRAVENOUS

## 2014-11-08 MED ORDER — CLOPIDOGREL BISULFATE 75 MG PO TABS
75.0000 mg | ORAL_TABLET | Freq: Every day | ORAL | Status: DC
  Administered 2014-11-09: 75 mg via ORAL
  Filled 2014-11-08: qty 1

## 2014-11-08 MED ORDER — PROTAMINE SULFATE 10 MG/ML IV SOLN
INTRAVENOUS | Status: AC
  Filled 2014-11-08: qty 10

## 2014-11-08 MED ORDER — LACTATED RINGERS IV SOLN
INTRAVENOUS | Status: DC | PRN
  Administered 2014-11-08 (×2): via INTRAVENOUS

## 2014-11-08 MED ORDER — LORATADINE 10 MG PO TABS
10.0000 mg | ORAL_TABLET | Freq: Every day | ORAL | Status: DC | PRN
  Filled 2014-11-08: qty 1

## 2014-11-08 MED ORDER — ACETAMINOPHEN 650 MG RE SUPP: 325.0000 mg | RECTAL | Status: DC | PRN

## 2014-11-08 MED ORDER — HEPARIN SODIUM (PORCINE) 1000 UNIT/ML IJ SOLN
INTRAMUSCULAR | Status: AC
  Filled 2014-11-08: qty 1

## 2014-11-08 MED ORDER — 0.9 % SODIUM CHLORIDE (POUR BTL) OPTIME
TOPICAL | Status: DC | PRN
  Administered 2014-11-08: 2000 mL

## 2014-11-08 MED ORDER — ROCURONIUM BROMIDE 50 MG/5ML IV SOLN
INTRAVENOUS | Status: AC
  Filled 2014-11-08: qty 1

## 2014-11-08 MED ORDER — DULOXETINE HCL 60 MG PO CPEP
60.0000 mg | ORAL_CAPSULE | Freq: Two times a day (BID) | ORAL | Status: DC
  Administered 2014-11-08 – 2014-11-09 (×2): 60 mg via ORAL
  Filled 2014-11-08 (×3): qty 1

## 2014-11-08 MED ORDER — STERILE WATER FOR INJECTION IJ SOLN
INTRAMUSCULAR | Status: AC
  Filled 2014-11-08: qty 10

## 2014-11-08 MED ORDER — HYDROCODONE-ACETAMINOPHEN 10-325 MG PO TABS: 1.0000 | ORAL_TABLET | Freq: Four times a day (QID) | ORAL | Status: DC | PRN

## 2014-11-08 MED ORDER — CHLORHEXIDINE GLUCONATE CLOTH 2 % EX PADS: 6.0000 | MEDICATED_PAD | Freq: Once | CUTANEOUS | Status: DC

## 2014-11-08 MED ORDER — ASPIRIN EC 325 MG PO TBEC
DELAYED_RELEASE_TABLET | ORAL | Status: AC
  Administered 2014-11-08: 325 mg via ORAL
  Filled 2014-11-08: qty 1

## 2014-11-08 MED ORDER — MECLIZINE HCL 25 MG PO TABS
25.0000 mg | ORAL_TABLET | ORAL | Status: DC | PRN
  Filled 2014-11-08: qty 1

## 2014-11-08 MED ORDER — GLYCOPYRROLATE 0.2 MG/ML IJ SOLN
INTRAMUSCULAR | Status: DC | PRN
  Administered 2014-11-08: 0.4 mg via INTRAVENOUS

## 2014-11-08 MED ORDER — ZOLPIDEM TARTRATE 5 MG PO TABS
5.0000 mg | ORAL_TABLET | Freq: Every day | ORAL | Status: DC
  Administered 2014-11-08: 5 mg via ORAL
  Filled 2014-11-08: qty 1

## 2014-11-08 MED ORDER — SODIUM CHLORIDE 0.9 % IR SOLN
Status: DC | PRN
  Administered 2014-11-08: 500 mL

## 2014-11-08 MED ORDER — SODIUM CHLORIDE 0.9 % IV SOLN
INTRAVENOUS | Status: DC
  Administered 2014-11-08: 16:00:00 via INTRAVENOUS

## 2014-11-08 MED ORDER — DOCUSATE SODIUM 100 MG PO CAPS
100.0000 mg | ORAL_CAPSULE | Freq: Every day | ORAL | Status: DC
  Administered 2014-11-09: 100 mg via ORAL
  Filled 2014-11-08: qty 1

## 2014-11-08 MED ORDER — LIDOCAINE HCL (PF) 1 % IJ SOLN
INTRAMUSCULAR | Status: AC
  Filled 2014-11-08: qty 30

## 2014-11-08 MED ORDER — THROMBIN 20000 UNITS EX SOLR
CUTANEOUS | Status: AC
  Filled 2014-11-08: qty 20000

## 2014-11-08 MED ORDER — PROPOFOL 10 MG/ML IV BOLUS
INTRAVENOUS | Status: AC
  Filled 2014-11-08: qty 20

## 2014-11-08 MED ORDER — METOPROLOL TARTRATE 1 MG/ML IV SOLN: 2.0000 mg | INTRAVENOUS | Status: DC | PRN

## 2014-11-08 MED ORDER — LABETALOL HCL 5 MG/ML IV SOLN: 10.0000 mg | INTRAVENOUS | Status: DC | PRN

## 2014-11-08 MED ORDER — ALUM & MAG HYDROXIDE-SIMETH 200-200-20 MG/5ML PO SUSP: 15.0000 mL | ORAL | Status: DC | PRN

## 2014-11-08 MED ORDER — ENOXAPARIN SODIUM 30 MG/0.3ML ~~LOC~~ SOLN
30.0000 mg | SUBCUTANEOUS | Status: DC
  Administered 2014-11-09: 30 mg via SUBCUTANEOUS
  Filled 2014-11-08 (×2): qty 0.3

## 2014-11-08 MED ORDER — ONDANSETRON HCL 4 MG/2ML IJ SOLN: 4.0000 mg | Freq: Once | INTRAMUSCULAR | Status: DC | PRN

## 2014-11-08 MED ORDER — PANTOPRAZOLE SODIUM 40 MG PO TBEC
40.0000 mg | DELAYED_RELEASE_TABLET | Freq: Every day | ORAL | Status: DC
  Administered 2014-11-08 – 2014-11-09 (×2): 40 mg via ORAL
  Filled 2014-11-08: qty 1

## 2014-11-08 MED ORDER — BISACODYL 10 MG RE SUPP: 10.0000 mg | Freq: Every day | RECTAL | Status: DC | PRN

## 2014-11-08 MED ORDER — NEOSTIGMINE METHYLSULFATE 10 MG/10ML IV SOLN
INTRAVENOUS | Status: AC
  Filled 2014-11-08: qty 4

## 2014-11-08 MED ORDER — LIDOCAINE HCL 4 % MT SOLN
OROMUCOSAL | Status: DC | PRN
  Administered 2014-11-08 (×2): 4 mL via TOPICAL

## 2014-11-08 MED ORDER — EPHEDRINE SULFATE 50 MG/ML IJ SOLN
INTRAMUSCULAR | Status: AC
  Filled 2014-11-08: qty 1

## 2014-11-08 MED ORDER — MORPHINE SULFATE 2 MG/ML IJ SOLN: 2.0000 mg | INTRAMUSCULAR | Status: DC | PRN

## 2014-11-08 MED ORDER — LISINOPRIL 20 MG PO TABS
20.0000 mg | ORAL_TABLET | Freq: Every day | ORAL | Status: DC
  Administered 2014-11-09: 20 mg via ORAL
  Filled 2014-11-08 (×2): qty 1

## 2014-11-08 MED ORDER — NEOSTIGMINE METHYLSULFATE 10 MG/10ML IV SOLN
INTRAVENOUS | Status: DC | PRN
  Administered 2014-11-08: 3 mg via INTRAVENOUS

## 2014-11-08 MED ORDER — PROTAMINE SULFATE 10 MG/ML IV SOLN
INTRAVENOUS | Status: DC | PRN
  Administered 2014-11-08: 10 mg via INTRAVENOUS
  Administered 2014-11-08 (×2): 20 mg via INTRAVENOUS
  Administered 2014-11-08: 10 mg via INTRAVENOUS
  Administered 2014-11-08: 20 mg via INTRAVENOUS

## 2014-11-08 MED ORDER — ROCURONIUM BROMIDE 100 MG/10ML IV SOLN
INTRAVENOUS | Status: DC | PRN
  Administered 2014-11-08: 30 mg via INTRAVENOUS

## 2014-11-08 MED ORDER — ACETAMINOPHEN 325 MG PO TABS: 325.0000 mg | ORAL_TABLET | ORAL | Status: DC | PRN

## 2014-11-08 MED ORDER — PROPOFOL 10 MG/ML IV BOLUS
INTRAVENOUS | Status: DC | PRN
  Administered 2014-11-08: 150 mg via INTRAVENOUS

## 2014-11-08 MED ORDER — POLYETHYLENE GLYCOL 3350 17 G PO PACK
17.0000 g | PACK | Freq: Every day | ORAL | Status: DC | PRN
  Filled 2014-11-08: qty 1

## 2014-11-08 MED ORDER — PHENOL 1.4 % MT LIQD
1.0000 | OROMUCOSAL | Status: DC | PRN
  Administered 2014-11-08: 1 via OROMUCOSAL
  Filled 2014-11-08: qty 177

## 2014-11-08 MED ORDER — FUROSEMIDE 80 MG PO TABS
80.0000 mg | ORAL_TABLET | Freq: Two times a day (BID) | ORAL | Status: DC
  Administered 2014-11-08 – 2014-11-09 (×2): 80 mg via ORAL
  Filled 2014-11-08 (×4): qty 1

## 2014-11-08 MED ORDER — SODIUM CHLORIDE 0.9 % IV SOLN
500.0000 mL | Freq: Once | INTRAVENOUS | Status: AC | PRN
  Administered 2014-11-08: 500 mL via INTRAVENOUS

## 2014-11-08 MED ORDER — SUCCINYLCHOLINE CHLORIDE 20 MG/ML IJ SOLN
INTRAMUSCULAR | Status: AC
  Filled 2014-11-08: qty 1

## 2014-11-08 MED ORDER — HEPARIN SODIUM (PORCINE) 1000 UNIT/ML IJ SOLN
INTRAMUSCULAR | Status: DC | PRN
  Administered 2014-11-08: 8000 [IU] via INTRAVENOUS

## 2014-11-08 MED ORDER — ONDANSETRON HCL 4 MG/2ML IJ SOLN
INTRAMUSCULAR | Status: AC
  Filled 2014-11-08: qty 2

## 2014-11-08 SURGICAL SUPPLY — 46 items
CANISTER SUCTION 2500CC (MISCELLANEOUS) ×2 IMPLANT
CANNULA VESSEL 3MM 2 BLNT TIP (CANNULA) ×3 IMPLANT
CATH ROBINSON RED A/P 18FR (CATHETERS) ×2 IMPLANT
CLIP TI MEDIUM 6 (CLIP) ×2 IMPLANT
CLIP TI WIDE RED SMALL 6 (CLIP) ×2 IMPLANT
CRADLE DONUT ADULT HEAD (MISCELLANEOUS) ×2 IMPLANT
DECANTER SPIKE VIAL GLASS SM (MISCELLANEOUS) IMPLANT
DRAIN HEMOVAC 1/8 X 5 (WOUND CARE) IMPLANT
ELECT REM PT RETURN 9FT ADLT (ELECTROSURGICAL) ×2
ELECTRODE REM PT RTRN 9FT ADLT (ELECTROSURGICAL) ×1 IMPLANT
EVACUATOR SILICONE 100CC (DRAIN) IMPLANT
GAUZE SPONGE 4X4 12PLY STRL (GAUZE/BANDAGES/DRESSINGS) ×2 IMPLANT
GEL ULTRASOUND 20GR AQUASONIC (MISCELLANEOUS) IMPLANT
GLOVE BIO SURGEON STRL SZ 6.5 (GLOVE) ×2 IMPLANT
GLOVE BIO SURGEON STRL SZ7.5 (GLOVE) ×2 IMPLANT
GLOVE BIOGEL PI IND STRL 6.5 (GLOVE) IMPLANT
GLOVE BIOGEL PI INDICATOR 6.5 (GLOVE) ×2
GLOVE SURG SS PI 6.5 STRL IVOR (GLOVE) ×2 IMPLANT
GOWN STRL REUS W/ TWL LRG LVL3 (GOWN DISPOSABLE) ×3 IMPLANT
GOWN STRL REUS W/TWL LRG LVL3 (GOWN DISPOSABLE) ×6
KIT BASIN OR (CUSTOM PROCEDURE TRAY) ×2 IMPLANT
KIT ROOM TURNOVER OR (KITS) ×2 IMPLANT
LIQUID BAND (GAUZE/BANDAGES/DRESSINGS) ×2 IMPLANT
LOOP VESSEL MINI RED (MISCELLANEOUS) IMPLANT
NDL HYPO 25GX1X1/2 BEV (NEEDLE) IMPLANT
NEEDLE HYPO 25GX1X1/2 BEV (NEEDLE) IMPLANT
NS IRRIG 1000ML POUR BTL (IV SOLUTION) ×4 IMPLANT
PACK CAROTID (CUSTOM PROCEDURE TRAY) ×2 IMPLANT
PAD ARMBOARD 7.5X6 YLW CONV (MISCELLANEOUS) ×4 IMPLANT
PATCH HEMASHIELD 8X75 (Vascular Products) ×1 IMPLANT
SHUNT CAROTID BYPASS 10 (VASCULAR PRODUCTS) ×1 IMPLANT
SHUNT CAROTID BYPASS 12FRX15.5 (VASCULAR PRODUCTS) IMPLANT
SPONGE INTESTINAL PEANUT (DISPOSABLE) ×1 IMPLANT
SPONGE SURGIFOAM ABS GEL 100 (HEMOSTASIS) IMPLANT
SUT ETHILON 3 0 PS 1 (SUTURE) IMPLANT
SUT PROLENE 5 0 C 1 24 (SUTURE) ×1 IMPLANT
SUT PROLENE 6 0 CC (SUTURE) ×2 IMPLANT
SUT PROLENE 7 0 BV 1 (SUTURE) IMPLANT
SUT PROLENE 7 0 BV1 MDA (SUTURE) ×1 IMPLANT
SUT SILK 3 0 TIES 17X18 (SUTURE)
SUT SILK 3-0 18XBRD TIE BLK (SUTURE) IMPLANT
SUT VIC AB 3-0 SH 27 (SUTURE) ×2
SUT VIC AB 3-0 SH 27X BRD (SUTURE) ×1 IMPLANT
SUT VICRYL 4-0 PS2 18IN ABS (SUTURE) ×2 IMPLANT
SYR CONTROL 10ML LL (SYRINGE) IMPLANT
WATER STERILE IRR 1000ML POUR (IV SOLUTION) ×2 IMPLANT

## 2014-11-08 NOTE — Interval H&P Note (Signed)
History and Physical Interval Note:  11/08/2014 8:15 AM  Sydney Phillips  has presented today for surgery, with the diagnosis of Left carotid artery stenosis I65.22  The various methods of treatment have been discussed with the patient and family. After consideration of risks, benefits and other options for treatment, the patient has consented to  Procedure(s): ENDARTERECTOMY CAROTID (Left) as a surgical intervention .  The patient's history has been reviewed, patient examined, no change in status, stable for surgery.  I have reviewed the patient's chart and labs.  Questions were answered to the patient's satisfaction.     Ruta Hinds

## 2014-11-08 NOTE — OR Nursing (Signed)
Post op neuro same as preop.

## 2014-11-08 NOTE — Progress Notes (Signed)
ANTIBIOTIC CONSULT NOTE - INITIAL  Pharmacy Consult for Vancomycin Indication: Surgical Prop. X 2 doses  Allergies  Allergen Reactions  . Codeine Itching  . Darvon Itching  . Meloxicam Other (See Comments)    Unknown    . Potassium-Containing Compounds Other (See Comments)    Causes severe constipation  . Propoxyphene N-Acetaminophen Itching  . Rofecoxib Other (See Comments)    Unknown    . Statins Itching and Other (See Comments)    Sleeplessness  . Azithromycin Rash  . Erythromycin Rash  . Penicillins Rash  . Sulfa Antibiotics Rash    Vital Signs: Temp: 97.9 F (36.6 C) (05/25 1521) BP: 114/44 mmHg (05/25 1513) Pulse Rate: 73 (05/25 1515)  Intake/Output from this shift: Total I/O In: 1650 [I.V.:1650] Out: 50 [Blood:50]  Labs: Estimated Creatinine Clearance: 52.7 mL/min (by C-G formula based on Cr of 0.96).  Microbiology: Recent Results (from the past 720 hour(s))  Surgical pcr screen     Status: None   Collection Time: 11/03/14  2:43 PM  Result Value Ref Range Status   MRSA, PCR NEGATIVE NEGATIVE Final   Staphylococcus aureus NEGATIVE NEGATIVE Final    Comment:        The Xpert SA Assay (FDA approved for NASAL specimens in patients over 2 years of age), is one component of a comprehensive surveillance program.  Test performance has been validated by [facility] for patients greater than or equal to 34 year old. It is not intended to diagnose infection nor to guide or monitor treatment.     Medical History: Past Medical History  Diagnosis Date  . COPD (chronic obstructive pulmonary disease)   . Hx of colonic polyps   . Insomnia   . Fibromyalgia   . Hyperlipemia     takes Crestor daily  . CAD (coronary artery disease)     Currently angina free, no evidence of reversible ischemia  . Vertigo     takes Meclizine prn  . Subclavian arterial stenosis, lt, with PTA/STENT 07/31/11 08/01/2011  . S/P angioplasty with stent, lt. subclavian 07/31/11  08/01/2011  . Hypertension     takes Amlodipine and Metoprolol daily  . Bronchitis   . Arthritis   . Chronic back pain   . GERD (gastroesophageal reflux disease)     was on meds but was taken off;now watches what she eats  . Chronic constipation   . Hemorrhoids   . Urinary incontinence   . Early cataracts, bilateral   . Depression with anxiety     takes Cymbalta daily  . Congestive heart failure (CHF)     New York Heart Association functional class 2, diastolic dysfunction  . PAD (peripheral artery disease)     Carotid, subclavian, and lower extremity beds, currently not symptomatic  . Syncope 07/28/2011    EF - 50-55, moderate concentric hypertrophy in left ventricle  . LBBB (left bundle branch block)     Stress test 09/03/2010, EF 55  . Automatic implantable cardioverter-defibrillator in situ   . Presence of combination internal cardiac defibrillator (ICD) and pacemaker   . Shortness of breath     when over exerting self per pt.  . Colon polyps 2003.  2015.    HP polyps 2003.  adnomas 2015.  required referal to [facility] for colonoscopic resection of flat polyps.   . Presence of permanent cardiac pacemaker   . Diabetes mellitus without complication     no meds  . History of kidney stones  Medications:  Prescriptions prior to admission  Medication Sig Dispense Refill Last Dose  . ALPRAZolam (XANAX) 1 MG tablet TAKE ONE TABLET BY MOUTH EVERY 6 HOURS AS NEEDED (Patient taking differently: TAKE ONE TABLET BY MOUTH EVERY 6 HOURS AS NEEDED FOR ANXIETY) 120 tablet 5 11/08/2014 at Unknown time  . clopidogrel (PLAVIX) 75 MG tablet Take 1 tablet (75 mg total) by mouth daily. 90 tablet 3 Past Week at Unknown time  . DULoxetine (CYMBALTA) 60 MG capsule Take 60 mg by mouth 2 (two) times daily.   11/08/2014 at Unknown time  . furosemide (LASIX) 80 MG tablet TAKE ONE TABLET BY MOUTH TWICE DAILY 180 tablet 3 11/07/2014 at Unknown time  . lisinopril (PRINIVIL,ZESTRIL) 20 MG tablet Take 1 tablet  (20 mg total) by mouth daily. 30 tablet 6 11/07/2014 at Unknown time  . loratadine (CLARITIN) 10 MG tablet Take 10 mg by mouth daily as needed for allergies.   11/07/2014 at Unknown time  . meclizine (ANTIVERT) 25 MG tablet Take 1 tablet (25 mg total) by mouth every 4 (four) hours as needed for dizziness. 60 tablet 5 11/07/2014 at Unknown time  . metoprolol tartrate (LOPRESSOR) 25 MG tablet Take 1 tablet (25 mg total) by mouth daily. 30 tablet 11 11/08/2014 at Unknown time  . polyethylene glycol (MIRALAX / GLYCOLAX) packet Take 17 g by mouth daily as needed for mild constipation. 30 each 11 Past Week at Unknown time  . zolpidem (AMBIEN) 10 MG tablet TAKE ONE TABLET BY MOUTH AT BEDTIME 30 tablet 5 11/07/2014 at Unknown time  . [DISCONTINUED] HYDROcodone-acetaminophen (NORCO) 10-325 MG per tablet Take 1 tablet by mouth every 6 (six) hours as needed for moderate pain. 120 tablet 0 11/08/2014 at Unknown time  . nitroGLYCERIN (NITROSTAT) 0.4 MG SL tablet Place 1 tablet (0.4 mg total) under the tongue every 5 (five) minutes x 3 doses as needed for chest pain. 30 tablet 0    Assessment: Patient is s/p left carotid endarterectomy. Received pre-op vanc at 0805. Patient has a CrCl of 50 mL/min.  Plan:  Vanc 1 gm q12h x 2 doses  Levester Fresh, PharmD, [facility] Clinical Pharmacist Pager [phone] 11/08/2014 4:23 PM

## 2014-11-08 NOTE — H&P (View-Only) (Signed)
    History of Present Illness  Sydney Phillips is a 79 y.o. (Oct 25, 1935) female who presents for continued follow-up s/p right femoral-tibioperoneal trunk bypass graft 10/12/13 and carotid stenosis. The patient is able to ambulate longer distances now. She denies any pain in her legs. She did sustain a fall yesterday that was due to losing her balance. In regards to her carotid stenosis, she denies any amaurosis fugax, hemiplegia, facial droop, receptive or expressive aphasia.  She continues to smoke but is trying to cut back. She takes plavix. She is intolerant of statins.   \ Physical Examination  Filed Vitals:   10/12/14 1615 10/12/14 1618  BP: 143/71 96/61  Pulse: 70   Height: 5\' 5"  (1.651 m)   Weight: 182 lb 8 oz (82.781 kg)   SpO2: 95%    Body mass index is 30.37 kg/(m^2).  General: A&O x 3, WDWN female in NAD  Pulmonary: Sym exp, good air movt, CTAB, no rales, rhonchi, & wheezing  Cardiac: RRR, Nl S1, S2, no Murmurs, rubs or gallops, left carotid bruit  Vascular: 2+ radial pulses b/l, 1+ dorsalis pedis pulses b/l, 1+ left posterior tibial pulse.  Musculoskeletal: M/S 5/5 throughout. Extremities without ischemic changes  Neurologic: CN II-XII grossly intact. Pain and light touch intact in extremitiesMotor exam as listed above  Non-Invasive Vascular Imaging ABI (Date: 10/12/2014) R: 1.0  TBI: <.69 L: 0.71 TBI: <.69  Carotid Duplex (Date: 10/12/2014)  Right ICA stenosis: 50-69%  Left ICA stenosis: 80-99% with velocity of 605 cm/s at proximal ICA  Vertebral artery antegrade right, retrograde flow left  Medical Decision Making  Sydney Phillips is a 79 y.o. female who is s/p right femoral-tibioperoneal trunk bypass graft 10/12/13 and asymptomatic left internal carotid artery stenosis 80-99%.   Her right leg bypass is patent and she has a palpable dorsalis pedis pulse. In regards to her carotid disease, she will need to have a left carotid endarterectomy. She must  undergo cardiac clearance first. We will schedule surgery following her cardiac clearance. She is currently on plavix. She was counseled on smoking cessation.   Virgina Jock, PA-C [facility] [place] Office: [phone] Pager: [phone]  10/12/2014, 4:42 PM  This patient was seen in conjunction with Dr. Oneida Alar  History and exam findings as above. Patent right leg bypass. Patient now has an asymptomatic high-grade greater than 80% left internal carotid artery stenosis. She will undergo cardiac risk stratification. After her cardiac workup is complete we will consider her for left carotid endarterectomy. Risks benefits possible complications and procedure details of left carotid endarterectomy were discussed the patient today including but not limited to bleeding infection cranial nerve injury stroke risk of 1%.  Ruta Hinds, MD [facility] [place] Office: [phone] Pager: [phone]

## 2014-11-08 NOTE — Anesthesia Preprocedure Evaluation (Addendum)
Anesthesia Evaluation  Patient identified by MRN, date of birth, ID band Patient awake    Reviewed: Allergy & Precautions, NPO status , Patient's Chart, lab work & pertinent test results  Airway Mallampati: I  TM Distance: >3 FB Neck ROM: Full    Dental  (+) Dental Advisory Given, Edentulous Upper, Poor Dentition, Chipped, Missing   Pulmonary shortness of breath and with exertion, COPDCurrent Smoker,    Pulmonary exam normal       Cardiovascular hypertension, Pt. on medications and Pt. on home beta blockers + CAD, + Past MI, + Cardiac Stents, + Peripheral Vascular Disease and +CHF Normal cardiovascular exam+ dysrhythmias + pacemaker + Cardiac Defibrillator     Neuro/Psych PSYCHIATRIC DISORDERS Depression    GI/Hepatic GERD-  Medicated,  Endo/Other  diabetes, Type 2  Renal/GU      Musculoskeletal  (+) Arthritis -, Fibromyalgia -  Abdominal   Peds  Hematology   Anesthesia Other Findings   Reproductive/Obstetrics                          Anesthesia Physical Anesthesia Plan  ASA: III  Anesthesia Plan: General   Post-op Pain Management:    Induction: Intravenous  Airway Management Planned: Oral ETT  Additional Equipment: Arterial line  Intra-op Plan:   Post-operative Plan: Extubation in OR  Informed Consent: I have reviewed the patients History and Physical, chart, labs and discussed the procedure including the risks, benefits and alternatives for the proposed anesthesia with the patient or authorized representative who has indicated his/her understanding and acceptance.   Dental advisory given  Plan Discussed with: CRNA, Anesthesiologist and Surgeon  Anesthesia Plan Comments:         Anesthesia Quick Evaluation

## 2014-11-08 NOTE — Progress Notes (Signed)
med[employer] rep. Notified dos.Marland Kitchen

## 2014-11-08 NOTE — Op Note (Signed)
Procedure Left carotid endarterectomy  Preoperative diagnosis: High-grade asymptomatic left internal carotid artery stenosis  Postoperative diagnosis: Same  Anesthesia General  Asst.: Leontine Locket, PAC  Operative findings: #1 greater than 80% left internal carotid artery stenosis                                #2 Dacron patch, 10 Fr shunt  Operative details: After obtaining informed consent, the patient was taken to the operating room. The patient was placed in a supine position on the operating room table. After induction of general anesthesia and endotracheal intubation, the patient's entire neck and chest was prepped and draped in the usual sterile fashion. An oblique incision was made on the left aspect of the patient's neck anterior to the border the left sternocleidomastoid muscle. The incision was carried into the subcutaneous tissues and through the platysma. The sternocleidomastoid muscle was identified and reflected laterally. The omohyoid muscle was identified and this was divided with cautery. The common carotid artery was then found at the base of the incision this was dissected free circumferentially. It was fairly thickened on palpation.  The vagus nerve was identified and protected. Dissection was then carried up to the level carotid bifurcation. The Ansa cervicalis was identified and traced up to its insertion into the hypoglossal nerve.  The hyperglossal nerve was well above the primary area of dissection. The internal carotid artery was dissected free circumferentially just below the level of the hypoglossal nerve and it was soft in character at this location and above any palpable disease. A vessel loop was placed around this. Next the external carotid and superior thyroid arteries were dissected free circumferentially and vessel loops were placed around these. The patient was given 8000 units of intravenous heparin. After 2 minutes of circulation time and raising the mean arterial  pressure to 85 mm mercury, the distal internal carotid artery was controlled with small bulldog clamp. The external carotid and superior thyroid arteries were controlled with vessel loops. The common carotid artery was controlled with a peripheral DeBakey clamp. A longitudinal opening was made in the common carotid artery just below the bifurcation. The arteriotomy was extended distally up into the internal carotid with Potts scissors. There was a large calcified plaque with greater than 80% stenosis in the common carotid adjacent to the bifurcation which extended into the internal carotid.  A 10 Fr shunt was brought up on the operative field and threaded into the distal internal carotid artery.  There was good backbleeding from this.  The shunt was then placed into the common carotid artery and secured with a Rummel.   Attention was then turned to the common carotid artery once again. A suitable endarterectomy plane was obtained and endarterectomy was begun in the common carotid artery and a reasonable proximal endpoint was obtained. An eversion endarterectomy was performed on the external carotid artery and a good endpoint was obtained. The plaque was then elevated in the internal carotid artery and a nice feathered distal endpoint was also obtained. However the endpoint was lifting slightly so it was tacked posteriorly with several 7 0 Prolene sutures.  The plaque was passed off the table as a specimen. All loose debris was then removed from the carotid bed and everything was thoroughly irrigated with heparinized saline. A Dacron patch was then brought on to the operative field and this was sewn on as a patch angioplasty using a running 6-0 Prolene suture.  Prior to completion of the anastomosis the internal carotid artery was thoroughly backbled. This was then controlled again with a small bulldog clamp.  The common carotid was thoroughly flushed forward. The external carotid was also thoroughly backbled.  The  remainder of the patch was completed and the anastomosis was secured. Flow was then restored first retrograde from the external carotid into the carotid bed then antegrade from the common carotid to the external carotid artery and after approximately 5 cardiac cycles to the internal carotid artery. Doppler was used to evaluate the external/internal and common carotid arteries and these all had good Doppler flow. Hemostasis was obtained with 1 additional repair suture at the distal end of the patch.  The patient was also given 80 mg of Protamine.    The platysma muscle was reapproximated using a running 3-0 Vicryl suture. The skin was closed with 4 0 Vicryl subcuticular stitch.  The patient was awakened in the operating room and was moving upper and lower extremities symmetrically and following commands.  The patient was stable on arrival to the PACU.  Ruta Hinds, MD [facility] [place] Office: [phone] Pager: [phone]

## 2014-11-08 NOTE — Anesthesia Postprocedure Evaluation (Signed)
  Anesthesia Post-op Note  Patient: Sydney Phillips  Procedure(s) Performed: Procedure(s): LEFT CAROTID ENDARTERECTOMY WITH HEMASHIELD PATCH ANGIOPLASTY (Left)  Patient Location: PACU  Anesthesia Type:General  Level of Consciousness: awake, alert , oriented and patient cooperative  Airway and Oxygen Therapy: Patient Spontanous Breathing  Post-op Pain: mild  Post-op Assessment: Post-op Vital signs reviewed, Patient's Cardiovascular Status Stable, Respiratory Function Stable, Patent Airway, No signs of Nausea or vomiting and Pain level controlled  Post-op Vital Signs: stable  Last Vitals:  Filed Vitals:   11/08/14 1138  BP:   Pulse: 83  Temp: 36.6 C  Resp: 15    Complications: No apparent anesthesia complications

## 2014-11-08 NOTE — Transfer of Care (Signed)
Immediate Anesthesia Transfer of Care Note  Patient: Sydney Phillips  Procedure(s) Performed: Procedure(s): LEFT CAROTID ENDARTERECTOMY WITH HEMASHIELD PATCH ANGIOPLASTY (Left)  Patient Location: PACU  Anesthesia Type:General  Level of Consciousness: awake, patient cooperative and lethargic  Airway & Oxygen Therapy: Patient Spontanous Breathing and Patient connected to face mask oxygen  Post-op Assessment: Report given to RN, Post -op Vital signs reviewed and stable and Patient moving all extremities X 4  Post vital signs: Reviewed and stable  Last Vitals:  Filed Vitals:   11/08/14 0713  BP: 175/65  Pulse: 66  Temp: 36.4 C  Resp: 18    Complications: No apparent anesthesia complications

## 2014-11-08 NOTE — OR Nursing (Signed)
Preop neuro check, bilateral handgrips strong and equal, moves all four extremities, tongue midline.

## 2014-11-08 NOTE — Anesthesia Procedure Notes (Signed)
Procedure Name: Intubation Date/Time: 11/08/2014 8:44 AM Performed by: Garrison Columbus T Pre-anesthesia Checklist: Patient identified, Emergency Drugs available, Suction available and Patient being monitored Patient Re-evaluated:Patient Re-evaluated prior to inductionOxygen Delivery Method: Circle system utilized Preoxygenation: Pre-oxygenation with 100% oxygen Intubation Type: IV induction Ventilation: Mask ventilation without difficulty and Oral airway inserted - appropriate to patient size Laryngoscope Size: Sabra Heck and 2 Grade View: Grade I Tube type: Oral Tube size: 7.5 mm Number of attempts: 1 Airway Equipment and Method: Stylet,  LTA kit utilized and Oral airway Placement Confirmation: ETT inserted through vocal cords under direct vision,  positive ETCO2 and breath sounds checked- equal and bilateral Secured at: 22 cm Tube secured with: Tape Dental Injury: Teeth and Oropharynx as per pre-operative assessment

## 2014-11-09 ENCOUNTER — Encounter (HOSPITAL_COMMUNITY): Payer: Self-pay | Admitting: Vascular Surgery

## 2014-11-09 LAB — BASIC METABOLIC PANEL
ANION GAP: 9 (ref 5–15)
BUN: 11 mg/dL (ref 6–20)
CALCIUM: 8.6 mg/dL — AB (ref 8.9–10.3)
CO2: 31 mmol/L (ref 22–32)
Chloride: 101 mmol/L (ref 101–111)
Creatinine, Ser: 0.85 mg/dL (ref 0.44–1.00)
GFR calc Af Amer: >60 mL/min (ref >60)
Glucose, Bld: 144 mg/dL — ABNORMAL HIGH (ref 65–99)
Potassium: 2.9 mmol/L — ABNORMAL LOW (ref 3.5–5.1)
Sodium: 141 mmol/L (ref 135–145)

## 2014-11-09 LAB — CBC
HCT: 41.4 % (ref 36.0–46.0)
Hemoglobin: 13.3 g/dL (ref 12.0–15.0)
MCH: 29.5 pg (ref 26.0–34.0)
MCHC: 32.1 g/dL (ref 30.0–36.0)
MCV: 91.8 fL (ref 78.0–100.0)
PLATELETS: 233 10*3/uL (ref 150–400)
RBC: 4.51 MIL/uL (ref 3.87–5.11)
RDW: 13.7 % (ref 11.5–15.5)
WBC: 9.9 10*3/uL (ref 4.0–10.5)

## 2014-11-09 MED ORDER — POTASSIUM CHLORIDE CRYS ER 20 MEQ PO TBCR
20.0000 meq | EXTENDED_RELEASE_TABLET | Freq: Once | ORAL | Status: AC
  Administered 2014-11-09: 20 meq via ORAL

## 2014-11-09 NOTE — Progress Notes (Signed)
UR COMPLETED  

## 2014-11-09 NOTE — Progress Notes (Signed)
[name] to be D/C'd Home per MD order.  Discussed with the patient and all questions fully answered.  VSS, Skin clean, dry and intact without evidence of skin break down, no evidence of skin tears noted. IV catheter discontinued intact. Site without signs and symptoms of complications. Dressing and pressure applied.  An After Visit Summary was printed and given to the patient. Patient received prescription.  D/c education completed with patient/family including follow up instructions, medication list, d/c activities limitations if indicated, with other d/c instructions as indicated by MD - patient able to verbalize understanding, all questions fully answered.   Patient instructed to return to ED, call 911, or call MD for any changes in condition.   Patient escorted via [place], and D/C home via private auto.  Pecolia Ades [facility] 11/09/2014 10:19 AM

## 2014-11-09 NOTE — Progress Notes (Addendum)
[facility] [place]  Subjective  - Doing great ready to go home.   Objective 157/71 78 97.9 F (36.6 C) (Oral) 19 92%  Intake/Output Summary (Last 24 hours) at 11/09/14 0750 Last data filed at 11/09/14 0645  Gross per 24 hour  Intake   3175 ml  Output   2825 ml  Net    350 ml    Grip 5/5 equal Incision clean and dry without hematoma Palpable radial pulses equal Speech is clear, right facial droop no change from prior to OR Heart RRR Lungs non labored breathing  Assessment/Planning: POD #left CEA Taking PO's well, ambulating, voided  D/C home f/u in 2 weeks  Laurence Slate [facility] 11/09/2014 7:50 AM --  Agree with above Neuro intact No hematoma D/c home  Ruta Hinds, MD [facility] [place]: [phone] Pager: [phone]   Laboratory Lab Results:  Recent Labs  11/08/14 1835 11/09/14 0533  WBC 9.7 9.9  HGB 13.3 13.3  HCT 40.2 41.4  PLT 233 233   BMET  Recent Labs  11/08/14 1835 11/09/14 0533  NA  --  141  K  --  2.9*  CL  --  101  CO2  --  31  GLUCOSE  --  144*  BUN  --  11  CREATININE 0.88 0.85  CALCIUM  --  8.6*    COAG Lab Results  Component Value Date   INR 1.02 11/03/2014   INR 0.95 10/07/2013   INR 1.03 07/31/2011   No results found for: PTT

## 2014-11-10 ENCOUNTER — Telehealth: Payer: Self-pay | Admitting: Vascular Surgery

## 2014-11-10 NOTE — Telephone Encounter (Addendum)
-----   Message from Mena Goes, RN sent at 11/08/2014 11:15 AM EDT ----- Regarding: Schedule   ----- Message -----    From: Gabriel Earing, PA-C    Sent: 11/08/2014  11:12 AM      To: [facility]vs Charge Pool  S/p left CEA 11/08/14.  F/u with Dr. Oneida Alar in 2 weeks.  Thanks, Samantha  11/10/14: LM for pt re appt, dpm

## 2014-11-16 NOTE — Discharge Summary (Signed)
[facility] Discharge Summary   Patient ID:  Sydney Phillips MRN: [id number] DOB/AGE: March 15, 1936 79 y.o.  Admit date: 11/08/2014 Discharge date: 11/16/2014 Date of Surgery: 11/08/2014 Surgeon: Surgeon(s): Elam Dutch, MD  Admission Diagnosis: Left carotid artery stenosis I65.22  Discharge Diagnoses:  Left carotid artery stenosis I65.22  Secondary Diagnoses: Past Medical History  Diagnosis Date  . COPD (chronic obstructive pulmonary disease)   . Hx of colonic polyps   . Insomnia   . Fibromyalgia   . Hyperlipemia     takes Crestor daily  . CAD (coronary artery disease)     Currently angina free, no evidence of reversible ischemia  . Vertigo     takes Meclizine prn  . Subclavian arterial stenosis, lt, with PTA/STENT 07/31/11 08/01/2011  . S/P angioplasty with stent, lt. subclavian 07/31/11 08/01/2011  . Hypertension     takes Amlodipine and Metoprolol daily  . Bronchitis   . Arthritis   . Chronic back pain   . GERD (gastroesophageal reflux disease)     was on meds but was taken off;now watches what she eats  . Chronic constipation   . Hemorrhoids   . Urinary incontinence   . Early cataracts, bilateral   . Depression with anxiety     takes Cymbalta daily  . Congestive heart failure (CHF)     New York Heart Association functional class 2, diastolic dysfunction  . PAD (peripheral artery disease)     Carotid, subclavian, and lower extremity beds, currently not symptomatic  . Syncope 07/28/2011    EF - 50-55, moderate concentric hypertrophy in left ventricle  . LBBB (left bundle branch block)     Stress test 09/03/2010, EF 55  . Automatic implantable cardioverter-defibrillator in situ   . Presence of combination internal cardiac defibrillator (ICD) and pacemaker   . Shortness of breath     when over exerting self per pt.  . Colon polyps 2003.  2015.    HP polyps 2003.  adnomas 2015.  required referal to [facility] for colonoscopic resection of flat  polyps.   . Presence of permanent cardiac pacemaker   . Diabetes mellitus without complication     no meds  . History of kidney stones     Procedure(s): LEFT CAROTID ENDARTERECTOMY WITH HEMASHIELD PATCH ANGIOPLASTY  Discharged Condition: good  HPI: Sydney Phillips is a 79 y.o. ([birth year]) female who presents for continued follow-up s/p right femoral-tibioperoneal trunk bypass graft 10/12/13 and has asymptomatic left internal carotid artery stenosis 80-99%.  . The patient is able to ambulate longer distances now. She denies any pain in her legs. She did sustain a fall yesterday that was due to losing her balance. In regards to her carotid stenosis, she denies any amaurosis fugax, hemiplegia, facial droop, receptive or expressive aphasia.  She continues to smoke but is trying to cut back. She takes plavix. She is intolerant of statins.    Hospital Course:  Sydney Phillips is a 79 y.o. female is S/P  Procedure(s): LEFT CAROTID ENDARTERECTOMY WITH [place] She had an uneventful stay over night and was discharged home POD#1.  She will follow up in the office in 2 weeks.      Significant Diagnostic Studies: CBC Lab Results  Component Value Date   WBC 9.9 11/09/2014   HGB 13.3 11/09/2014   HCT 41.4 11/09/2014   MCV 91.8 11/09/2014   PLT 233 11/09/2014    BMET    Component Value Date/Time   NA  141 11/09/2014 0533   K 2.9* 11/09/2014 0533   CL 101 11/09/2014 0533   CO2 31 11/09/2014 0533   GLUCOSE 144* 11/09/2014 0533   BUN 11 11/09/2014 0533   CREATININE 0.85 11/09/2014 0533   CREATININE 1.12* 01/10/2013 1745   CALCIUM 8.6* 11/09/2014 0533   GFRNONAA >60 11/09/2014 0533   GFRAA >60 11/09/2014 0533   COAG Lab Results  Component Value Date   INR 1.02 11/03/2014   INR 0.95 10/07/2013   INR 1.03 07/31/2011     Disposition:  Discharge to :Home Discharge Instructions    CAROTID Sugery: Call MD for difficulty swallowing or speaking; weakness in  arms or legs that is a new symtom; severe headache.  If you have increased swelling in the neck and/or  are having difficulty breathing, CALL 911    Complete by:  As directed      Call MD for:  redness, tenderness, or signs of infection (pain, swelling, bleeding, redness, odor or green/yellow discharge around incision site)    Complete by:  As directed      Call MD for:  severe or increased pain, loss or decreased feeling  in affected limb(s)    Complete by:  As directed      Call MD for:  temperature >100.5    Complete by:  As directed      Discharge patient    Complete by:  As directed   Discharge pt to home     Discharge wound care:    Complete by:  As directed   Shower daily with soap and water starting 11/10/14     Driving Restrictions    Complete by:  As directed   No driving for 2 weeks     Lifting restrictions    Complete by:  As directed   No lifting for 2 weeks     Resume previous diet    Complete by:  As directed             Medication List    TAKE these medications        ALPRAZolam 1 MG tablet  Commonly known as:  XANAX  TAKE ONE TABLET BY MOUTH EVERY 6 HOURS AS NEEDED     clopidogrel 75 MG tablet  Commonly known as:  PLAVIX  Take 1 tablet (75 mg total) by mouth daily.     DULoxetine 60 MG capsule  Commonly known as:  CYMBALTA  Take 60 mg by mouth 2 (two) times daily.     furosemide 80 MG tablet  Commonly known as:  LASIX  TAKE ONE TABLET BY MOUTH TWICE DAILY     HYDROcodone-acetaminophen 10-325 MG per tablet  Commonly known as:  NORCO  Take 1 tablet by mouth every 6 (six) hours as needed for moderate pain.     lisinopril 20 MG tablet  Commonly known as:  PRINIVIL,ZESTRIL  Take 1 tablet (20 mg total) by mouth daily.     loratadine 10 MG tablet  Commonly known as:  CLARITIN  Take 10 mg by mouth daily as needed for allergies.     meclizine 25 MG tablet  Commonly known as:  ANTIVERT  Take 1 tablet (25 mg total) by mouth every 4 (four) hours as  needed for dizziness.     metoprolol tartrate 25 MG tablet  Commonly known as:  LOPRESSOR  Take 1 tablet (25 mg total) by mouth daily.     nitroGLYCERIN 0.4 MG SL tablet  Commonly known as:  NITROSTAT  Place 1 tablet (0.4 mg total) under the tongue every 5 (five) minutes x 3 doses as needed for chest pain.     polyethylene glycol packet  Commonly known as:  MIRALAX / GLYCOLAX  Take 17 g by mouth daily as needed for mild constipation.     zolpidem 10 MG tablet  Commonly known as:  AMBIEN  TAKE ONE TABLET BY MOUTH AT BEDTIME       Verbal and written Discharge instructions given to the patient. Wound care per Discharge AVS   Signed: Laurence Slate [facility] 11/16/2014, 12:09 PM  --- For VQI Registry use --- Instructions: Press F2 to tab through selections.  Delete question if not applicable.   Modified Rankin score at D/C (0-6): Rankin Score=0  IV medication needed for:  1. Hypertension: No 2. Hypotension: No  Post-op Complications: No  1. Post-op CVA or TIA: No  If yes: Event classification (right eye, left eye, right cortical, left cortical, verterobasilar, other):   If yes: Timing of event (intra-op, <6 hrs post-op, >=6 hrs post-op, unknown):   2. CN injury: No  If yes: CN  injuried   3. Myocardial infarction: No  If yes: Dx by (EKG or clinical, Troponin):   4.  CHF: No  5.  Dysrhythmia (new): No  6. Wound infection: No  7. Reperfusion symptoms: No  8. Return to OR: No  If yes: return to OR for (bleeding, neurologic, other CEA incision, other):   Discharge medications: Statin use:  No  for medical reason   ASA use:  No  for medical reason   Beta blocker use:  Yes ACE-Inhibitor use:  Yes P2Y12 Antagonist use: [ ]  None, [x ] Plavix, [ ]  Plasugrel, [ ]  Ticlopinine, [ ]  Ticagrelor, [ ]  Other, [ ]  No for medical reason, [ ]  Non-compliant, [ ]  Not-indicated Anti-coagulant use:  [ ]  None, [ ]  Warfarin, [ ]  Rivaroxaban, [ ]  Dabigatran, [ ]  Other, [ ]  No for  medical reason, [ ]  Non-compliant, [ ]  Not-indicated

## 2014-11-20 ENCOUNTER — Encounter: Payer: Self-pay | Admitting: Vascular Surgery

## 2014-11-21 ENCOUNTER — Encounter: Payer: Self-pay | Admitting: Vascular Surgery

## 2014-11-22 ENCOUNTER — Encounter: Payer: Self-pay | Admitting: Vascular Surgery

## 2014-11-22 ENCOUNTER — Ambulatory Visit (INDEPENDENT_AMBULATORY_CARE_PROVIDER_SITE_OTHER): Payer: Self-pay | Admitting: Vascular Surgery

## 2014-11-22 VITALS — BP 116/64 | HR 89 | Ht 65.0 in | Wt 183.0 lb

## 2014-11-22 DIAGNOSIS — I6522 Occlusion and stenosis of left carotid artery: Secondary | ICD-10-CM

## 2014-11-22 DIAGNOSIS — Z48812 Encounter for surgical aftercare following surgery on the circulatory system: Secondary | ICD-10-CM

## 2014-11-22 NOTE — Progress Notes (Signed)
POST OPERATIVE OFFICE NOTE    CC:  F/u for surgery  HPI:  This is a 79 y.o. female who is s/p Left CEA.  She has no complaints and is doing well since surgery.  She has numbness on the left jaw and incisional numbness, but it seems to be getting better.  She is taking Plavix and still working on decreasing her smoking habit.    Allergies  Allergen Reactions  . Codeine Itching  . Darvon Itching  . Meloxicam Other (See Comments)    Unknown    . Potassium-Containing Compounds Other (See Comments)    Causes severe constipation  . Propoxyphene N-Acetaminophen Itching  . Rofecoxib Other (See Comments)    Unknown    . Statins Itching and Other (See Comments)    Sleeplessness  . Azithromycin Rash  . Erythromycin Rash  . Penicillins Rash  . Sulfa Antibiotics Rash    Current Outpatient Prescriptions  Medication Sig Dispense Refill  . ALPRAZolam (XANAX) 1 MG tablet TAKE ONE TABLET BY MOUTH EVERY 6 HOURS AS NEEDED (Patient taking differently: TAKE ONE TABLET BY MOUTH EVERY 6 HOURS AS NEEDED FOR ANXIETY) 120 tablet 5  . clopidogrel (PLAVIX) 75 MG tablet Take 1 tablet (75 mg total) by mouth daily. 90 tablet 3  . DULoxetine (CYMBALTA) 60 MG capsule Take 60 mg by mouth 2 (two) times daily.    . furosemide (LASIX) 80 MG tablet TAKE ONE TABLET BY MOUTH TWICE DAILY 180 tablet 3  . HYDROcodone-acetaminophen (NORCO) 10-325 MG per tablet Take 1 tablet by mouth every 6 (six) hours as needed for moderate pain. 20 tablet 0  . lisinopril (PRINIVIL,ZESTRIL) 20 MG tablet Take 1 tablet (20 mg total) by mouth daily. 30 tablet 6  . loratadine (CLARITIN) 10 MG tablet Take 10 mg by mouth daily as needed for allergies.    Marland Kitchen meclizine (ANTIVERT) 25 MG tablet Take 1 tablet (25 mg total) by mouth every 4 (four) hours as needed for dizziness. 60 tablet 5  . metoprolol tartrate (LOPRESSOR) 25 MG tablet Take 1 tablet (25 mg total) by mouth daily. 30 tablet 11  . nitroGLYCERIN (NITROSTAT) 0.4 MG SL tablet Place 1  tablet (0.4 mg total) under the tongue every 5 (five) minutes x 3 doses as needed for chest pain. 30 tablet 0  . polyethylene glycol (MIRALAX / GLYCOLAX) packet Take 17 g by mouth daily as needed for mild constipation. 30 each 11  . zolpidem (AMBIEN) 10 MG tablet TAKE ONE TABLET BY MOUTH AT BEDTIME 30 tablet 5   No current facility-administered medications for this visit.     ROS:  See HPI  Physical Exam:  Filed Vitals:   11/22/14 1507  BP: 116/64  Pulse:     Incision:  Well healed without erythema or edema Extremities:  Palpable equal radial pulses Neuro:  Left facial droop back to base line present prior to surgery, no tongue deviation and grip is equal bil. 5/5.  Assessment/Plan:  This is a 79 y.o. female who is s/p: left CEA and s/p right femoral-tibioperoneal trunk bypass graft 10/12/13    She feels significantly over all better and is now able to walk more and further since having these two procedures.  At this point she will F/U in 6 months for repeat carotid duplex, ABI's and right LE arterial study.     Theda Sers Lovie Zarling [facility] PA-C [facility] [phone]  Clinic MD:  Pt seen and examined with Dr. Oneida Alar   History and  exam findings as above patient is doing well since her carotid endarterectomy. She will continue to take her Plavix. She'll follow-up with [place] in 6 months time for repeat carotid duplex and a duplex of her bypass graft as well as bilateral ABIs.  Ruta Hinds, MD [facility] [place] Office: [phone] Pager: [phone]

## 2014-11-23 ENCOUNTER — Encounter: Payer: Medicare Other | Admitting: Vascular Surgery

## 2014-11-23 NOTE — Addendum Note (Signed)
Addended by: Dorthula Rue L on: 11/23/2014 04:14 PM   Modules accepted: Orders

## 2014-12-01 ENCOUNTER — Ambulatory Visit: Payer: Medicare Other | Admitting: Cardiovascular Disease

## 2014-12-29 ENCOUNTER — Encounter: Payer: Self-pay | Admitting: Family Medicine

## 2014-12-29 ENCOUNTER — Ambulatory Visit (INDEPENDENT_AMBULATORY_CARE_PROVIDER_SITE_OTHER): Payer: Medicare Other | Admitting: Family Medicine

## 2014-12-29 VITALS — BP 148/77 | HR 93 | Temp 99.0°F | Ht 65.0 in | Wt 188.0 lb

## 2014-12-29 DIAGNOSIS — J439 Emphysema, unspecified: Secondary | ICD-10-CM

## 2014-12-29 DIAGNOSIS — E119 Type 2 diabetes mellitus without complications: Secondary | ICD-10-CM

## 2014-12-29 DIAGNOSIS — K047 Periapical abscess without sinus: Secondary | ICD-10-CM | POA: Diagnosis not present

## 2014-12-29 DIAGNOSIS — I10 Essential (primary) hypertension: Secondary | ICD-10-CM | POA: Diagnosis not present

## 2014-12-29 LAB — HEMOGLOBIN A1C: Hgb A1c MFr Bld: 7 % — ABNORMAL HIGH (ref 4.6–6.5)

## 2014-12-29 MED ORDER — DOXYCYCLINE HYCLATE 100 MG PO CAPS: 100.0000 mg | ORAL_CAPSULE | Freq: Two times a day (BID) | ORAL | Status: AC

## 2014-12-29 NOTE — Progress Notes (Signed)
   Subjective:    Patient ID: Sydney Phillips, female    DOB: [birth year], 79 y.o.   MRN: [id number]  HPI Here to follow up on HTN and diabetes. Her BP has been stable at home and her glucoses run from 90 to 130. She feels well except for pain in a tooth on the right lower side. She is set to have this removed in about 6 weeks but she thinks she needs an antibiotic. No fever.   Review of Systems  Constitutional: Negative.   HENT: Negative.   Respiratory: Negative.   Cardiovascular: Negative.   Neurological: Negative.        Objective:   Physical Exam  Constitutional: She is oriented to person, place, and time. She appears well-developed and well-nourished.  HENT:  Broken tooth on lower right side  Neck: No thyromegaly present.  Cardiovascular: Normal rate, regular rhythm, normal heart sounds and intact distal pulses.   Pulmonary/Chest: Effort normal and breath sounds normal.  Lymphadenopathy:    She has no cervical adenopathy.  Neurological: She is alert and oriented to person, place, and time. No cranial nerve deficit.          Assessment & Plan:  Her HTN is stable. Get an A1c to check her diabetes. Cover for dental infection with Doxycycline.

## 2014-12-29 NOTE — Progress Notes (Signed)
Pre visit review using our clinic review tool, if applicable. No additional management support is needed unless otherwise documented below in the visit note. 

## 2015-01-01 ENCOUNTER — Ambulatory Visit: Payer: Medicare Other | Admitting: Family Medicine

## 2015-01-02 ENCOUNTER — Other Ambulatory Visit: Payer: Self-pay | Admitting: Family Medicine

## 2015-01-02 ENCOUNTER — Telehealth: Payer: Self-pay | Admitting: Family Medicine

## 2015-01-02 NOTE — Telephone Encounter (Signed)
I spoke with pt and gave results. She would like to know when to have her potassium level rechecked?

## 2015-01-02 NOTE — Telephone Encounter (Signed)
Pt would like a call back about her A1C results  °

## 2015-01-03 NOTE — Telephone Encounter (Signed)
I spoke with pt and went over below information. 

## 2015-01-03 NOTE — Telephone Encounter (Signed)
We can check a BMET with the next A1c in 90 days

## 2015-01-04 ENCOUNTER — Telehealth: Payer: Self-pay | Admitting: Family Medicine

## 2015-01-04 NOTE — Telephone Encounter (Signed)
Pt request refill of the following: DULoxetine (CYMBALTA) 60 MG capsule  clopidogrel (PLAVIX) 75 MG tablet   Pt said she need to have the above meds call in asap .Marland Kitchen

## 2015-01-05 NOTE — Telephone Encounter (Signed)
Both scripts were sent e-scribe.

## 2015-01-22 ENCOUNTER — Ambulatory Visit (INDEPENDENT_AMBULATORY_CARE_PROVIDER_SITE_OTHER): Payer: Medicare Other | Admitting: *Deleted

## 2015-01-22 DIAGNOSIS — I255 Ischemic cardiomyopathy: Secondary | ICD-10-CM

## 2015-01-22 NOTE — Progress Notes (Signed)
Remote ICD transmission.   

## 2015-01-24 ENCOUNTER — Other Ambulatory Visit: Payer: Self-pay | Admitting: Family Medicine

## 2015-01-30 LAB — CUP PACEART REMOTE DEVICE CHECK
Battery Voltage: 3.02 V
Brady Statistic AP VP Percent: 0.25 %
Brady Statistic RA Percent Paced: 0.28 %
Brady Statistic RV Percent Paced: 99.55 %
Date Time Interrogation Session: 20160808133029
HighPow Impedance: 59 Ohm
HighPow Impedance: 66 Ohm
HighPow Impedance: 912 Ohm
Lead Channel Impedance Value: 456 Ohm
Lead Channel Pacing Threshold Amplitude: 0.625 V
Lead Channel Pacing Threshold Pulse Width: 0.4 ms
Lead Channel Pacing Threshold Pulse Width: 1 ms
Lead Channel Sensing Intrinsic Amplitude: 4 mV
Lead Channel Sensing Intrinsic Amplitude: 4 mV
Lead Channel Setting Pacing Amplitude: 1.5 V
Lead Channel Setting Pacing Pulse Width: 0.4 ms
Lead Channel Setting Pacing Pulse Width: 1 ms
Lead Channel Setting Sensing Sensitivity: 0.3 mV
MDC IDC MSMT LEADCHNL LV IMPEDANCE VALUE: 323 Ohm
MDC IDC MSMT LEADCHNL LV IMPEDANCE VALUE: 437 Ohm
MDC IDC MSMT LEADCHNL LV IMPEDANCE VALUE: 627 Ohm
MDC IDC MSMT LEADCHNL LV PACING THRESHOLD AMPLITUDE: 1.875 V
MDC IDC MSMT LEADCHNL RA PACING THRESHOLD AMPLITUDE: 0.5 V
MDC IDC MSMT LEADCHNL RA PACING THRESHOLD PULSEWIDTH: 0.4 ms
MDC IDC MSMT LEADCHNL RV IMPEDANCE VALUE: 893 Ohm
MDC IDC MSMT LEADCHNL RV SENSING INTR AMPL: 31.625 mV
MDC IDC MSMT LEADCHNL RV SENSING INTR AMPL: 31.625 mV
MDC IDC SET LEADCHNL LV PACING AMPLITUDE: 2.5 V
MDC IDC SET LEADCHNL RV PACING AMPLITUDE: 2 V
MDC IDC STAT BRADY AP VS PERCENT: 0.03 %
MDC IDC STAT BRADY AS VP PERCENT: 99.3 %
MDC IDC STAT BRADY AS VS PERCENT: 0.42 %
Zone Setting Detection Interval: 320 ms
Zone Setting Detection Interval: 350 ms
Zone Setting Detection Interval: 360 ms
Zone Setting Detection Interval: 360 ms

## 2015-02-05 ENCOUNTER — Telehealth: Payer: Self-pay | Admitting: Family Medicine

## 2015-02-05 NOTE — Telephone Encounter (Signed)
Pt request refill of the following: HYDROcodone-acetaminophen (NORCO) 10-325 MG per tablet ° ° °Phamacy: ° °

## 2015-02-06 MED ORDER — HYDROCODONE-ACETAMINOPHEN 10-325 MG PO TABS: 1.0000 | ORAL_TABLET | Freq: Four times a day (QID) | ORAL | Status: DC | PRN

## 2015-02-06 NOTE — Telephone Encounter (Signed)
Script is ready for pick up and I left a voice message for pt. 

## 2015-02-06 NOTE — Telephone Encounter (Signed)
done

## 2015-02-14 ENCOUNTER — Encounter: Payer: Self-pay | Admitting: Cardiology

## 2015-02-20 ENCOUNTER — Encounter: Payer: Self-pay | Admitting: Cardiovascular Disease

## 2015-03-27 ENCOUNTER — Encounter: Payer: Self-pay | Admitting: Vascular Surgery

## 2015-03-29 ENCOUNTER — Ambulatory Visit (INDEPENDENT_AMBULATORY_CARE_PROVIDER_SITE_OTHER): Payer: Medicare Other | Admitting: Vascular Surgery

## 2015-03-29 ENCOUNTER — Ambulatory Visit (INDEPENDENT_AMBULATORY_CARE_PROVIDER_SITE_OTHER)
Admission: RE | Admit: 2015-03-29 | Discharge: 2015-03-29 | Disposition: A | Discharge status: Home / Self Care | Payer: Medicare Other | Source: Ambulatory Visit | Attending: Vascular Surgery | Admitting: Vascular Surgery

## 2015-03-29 ENCOUNTER — Ambulatory Visit (HOSPITAL_COMMUNITY)
Admission: RE | Admit: 2015-03-29 | Discharge: 2015-03-29 | Disposition: A | Discharge status: Home / Self Care | Payer: Medicare Other | Source: Ambulatory Visit | Attending: Vascular Surgery | Admitting: Vascular Surgery

## 2015-03-29 ENCOUNTER — Encounter: Payer: Self-pay | Admitting: Vascular Surgery

## 2015-03-29 VITALS — BP 128/73 | HR 85 | Ht 65.0 in | Wt 185.0 lb

## 2015-03-29 DIAGNOSIS — I739 Peripheral vascular disease, unspecified: Secondary | ICD-10-CM | POA: Diagnosis not present

## 2015-03-29 DIAGNOSIS — Z48812 Encounter for surgical aftercare following surgery on the circulatory system: Secondary | ICD-10-CM | POA: Insufficient documentation

## 2015-03-29 DIAGNOSIS — I6522 Occlusion and stenosis of left carotid artery: Secondary | ICD-10-CM

## 2015-03-29 DIAGNOSIS — I6523 Occlusion and stenosis of bilateral carotid arteries: Secondary | ICD-10-CM

## 2015-03-29 DIAGNOSIS — I6521 Occlusion and stenosis of right carotid artery: Secondary | ICD-10-CM | POA: Diagnosis not present

## 2015-03-29 NOTE — Patient Instructions (Signed)
Please review the tobacco cessation information given to you today. It lists many hints that are useful in your effort to stop smoking. The [facility] contact phone # is [phone] These nurses and advisors offer lots of FREE information and aids to help you quit.    The [facility] line #  (450)376-2433, they will also assist you with programs designed to help you stop smoking.    Smoking Cessation, Tips for Success If you are ready to quit smoking, congratulations! You have chosen to help yourself be healthier. Cigarettes bring nicotine, tar, carbon monoxide, and other irritants into your body. Your lungs, heart, and blood vessels will be able to work better without these poisons. There are many different ways to quit smoking. Nicotine gum, nicotine patches, a nicotine inhaler, or nicotine nasal spray can help with physical craving. Hypnosis, support groups, and medicines help break the habit of smoking. WHAT THINGS CAN I DO TO MAKE QUITTING EASIER?  Here are some tips to help you quit for good:  Pick a date when you will quit smoking completely. Tell all of your friends and family about your plan to quit on that date.  Do not try to slowly cut down on the number of cigarettes you are smoking. Pick a quit date and quit smoking completely starting on that day.  Throw away all cigarettes.   Clean and remove all ashtrays from your home, work, and car.  On a card, write down your reasons for quitting. Carry the card with you and read it when you get the urge to smoke.  Cleanse your body of nicotine. Drink enough water and fluids to keep your urine clear or pale yellow. Do this after quitting to flush the nicotine from your body.  Learn to predict your moods. Do not let a bad situation be your excuse to have a cigarette. Some situations in your life might tempt you into wanting a cigarette.  Never have "just one" cigarette. It leads to wanting another and another. Remind  yourself of your decision to quit.  Change habits associated with smoking. If you smoked while driving or when feeling stressed, try other activities to replace smoking. Stand up when drinking your coffee. Brush your teeth after eating. Sit in a different chair when you read the paper. Avoid alcohol while trying to quit, and try to drink fewer caffeinated beverages. Alcohol and caffeine may urge you to smoke.  Avoid foods and drinks that can trigger a desire to smoke, such as sugary or spicy foods and alcohol.  Ask people who smoke not to smoke around you.  Have something planned to do right after eating or having a cup of coffee. For example, plan to take a walk or exercise.  Try a relaxation exercise to calm you down and decrease your stress. Remember, you may be tense and nervous for the first 2 weeks after you quit, but this will pass.  Find new activities to keep your hands busy. Play with a pen, coin, or rubber band. Doodle or draw things on paper.  Brush your teeth right after eating. This will help cut down on the craving for the taste of tobacco after meals. You can also try mouthwash.   Use oral substitutes in place of cigarettes. Try using lemon drops, carrots, cinnamon sticks, or chewing gum. Keep them handy so they are available when you have the urge to smoke.  When you have the urge to smoke, try deep breathing.  Designate  your home as a nonsmoking area.  If you are a heavy smoker, ask your health care provider about a prescription for nicotine chewing gum. It can ease your withdrawal from nicotine.  Reward yourself. Set aside the cigarette money you save and buy yourself something nice.  Look for support from others. Join a support group or smoking cessation program. Ask someone at home or at work to help you with your plan to quit smoking.  Always ask yourself, "Do I need this cigarette or is this just a reflex?" Tell yourself, "Today, I choose not to smoke," or "I do  not want to smoke." You are reminding yourself of your decision to quit.  Do not replace cigarette smoking with electronic cigarettes (commonly called e-cigarettes). The safety of e-cigarettes is unknown, and some may contain harmful chemicals.  If you relapse, do not give up! Plan ahead and think about what you will do the next time you get the urge to smoke. HOW WILL I FEEL WHEN I QUIT SMOKING? You may have symptoms of withdrawal because your body is used to nicotine (the addictive substance in cigarettes). You may crave cigarettes, be irritable, feel very hungry, cough often, get headaches, or have difficulty concentrating. The withdrawal symptoms are only temporary. They are strongest when you first quit but will go away within 10-14 days. When withdrawal symptoms occur, stay in control. Think about your reasons for quitting. Remind yourself that these are signs that your body is healing and getting used to being without cigarettes. Remember that withdrawal symptoms are easier to treat than the major diseases that smoking can cause.  Even after the withdrawal is over, expect periodic urges to smoke. However, these cravings are generally short lived and will go away whether you smoke or not. Do not smoke! WHAT RESOURCES ARE AVAILABLE TO HELP ME QUIT SMOKING? Your health care provider can direct you to community resources or hospitals for support, which may include:  Group support.  Education.  Hypnosis.  Therapy.   This information is not intended to replace advice given to you by your health care provider. Make sure you discuss any questions you have with your health care provider.   Document Released: 02/29/2004 Document Revised: 06/23/2014 Document Reviewed: 11/18/2012 Elsevier Interactive Patient Education 2016 Elsevier Inc.   Peripheral Vascular Disease Peripheral vascular disease (PVD) is a disease of the blood vessels that are not part of your heart and brain. A simple term for  PVD is poor circulation. In most cases, PVD narrows the blood vessels that carry blood from your heart to the rest of your body. This can result in a decreased supply of blood to your arms, legs, and internal organs, like your stomach or kidneys. However, it most often affects a person's lower legs and feet. There are two types of PVD.  Organic PVD. This is the more common type. It is caused by damage to the structure of blood vessels.  Functional PVD. This is caused by conditions that make blood vessels contract and tighten (spasm). Without treatment, PVD tends to get worse over time. PVD can also lead to acute ischemic limb. This is when an arm or limb suddenly has trouble getting enough blood. This is a medical emergency. CAUSES Each type of PVD has many different causes. The most common cause of PVD is buildup of a fatty material (plaque) inside of your arteries (atherosclerosis). Small amounts of plaque can break off from the walls of the blood vessels and become lodged in  a smaller artery. This blocks blood flow and can cause acute ischemic limb. Other common causes of PVD include:  Blood clots that form inside of blood vessels.  Injuries to blood vessels.  Diseases that cause inflammation of blood vessels or cause blood vessel spasms.  Health behaviors and health history that increase your risk of developing PVD. RISK FACTORS  You may have a greater risk of PVD if you:  Have a family history of PVD.  Have certain medical conditions, including:  High cholesterol.  Diabetes.  High blood pressure (hypertension).  Coronary heart disease.  Past problems with blood clots.  Past injury, such as burns or a broken bone. These may have damaged blood vessels in your limbs.  Buerger disease. This is caused by inflamed blood vessels in your hands and feet.  Some forms of arthritis.  Rare birth defects that affect the arteries in your legs.  Use tobacco.  Do not get enough  exercise.  Are obese.  Are age 38 or older. SIGNS AND SYMPTOMS  PVD may cause many different symptoms. Your symptoms depend on what part of your body is not getting enough blood. Some common signs and symptoms include:  Cramps in your lower legs. This may be a symptom of poor leg circulation (claudication).  Pain and weakness in your legs while you are physically active that goes away when you rest (intermittent claudication).  Leg pain when at rest.  Leg numbness, tingling, or weakness.  Coldness in a leg or foot, especially when compared with the other leg.  Skin or hair changes. These can include:  Hair loss.  Shiny skin.  Pale or bluish skin.  Thick toenails.  Inability to get or maintain an erection (erectile dysfunction). People with PVD are more prone to developing ulcers and sores on their toes, feet, or legs. These may take longer than normal to heal. DIAGNOSIS Your health care provider may diagnose PVD from your signs and symptoms. The health care provider will also do a physical exam. You may have tests to find out what is causing your PVD and determine its severity. Tests may include:  Blood pressure recordings from your arms and legs and measurements of the strength of your pulses (pulse volume recordings).  Imaging studies using sound waves to take pictures of the blood flow through your blood vessels (Doppler ultrasound).  Injecting a dye into your blood vessels before having imaging studies using:  X-rays (angiogram or arteriogram).  Computer-generated X-rays (CT angiogram).  A powerful electromagnetic field and a computer (magnetic resonance angiogram or MRA). TREATMENT Treatment for PVD depends on the cause of your condition and the severity of your symptoms. It also depends on your age. Underlying causes need to be treated and controlled. These include long-lasting (chronic) conditions, such as diabetes, high cholesterol, and high blood pressure. You  may need to first try making lifestyle changes and taking medicines. Surgery may be needed if these do not work. Lifestyle changes may include:  Quitting smoking.  Exercising regularly.  Following a low-fat, low-cholesterol diet. Medicines may include:  Blood thinners to prevent blood clots.  Medicines to improve blood flow.  Medicines to improve your blood cholesterol levels. Surgical procedures may include:  A procedure that uses an inflated balloon to open a blocked artery and improve blood flow (angioplasty).  A procedure to put in a tube (stent) to keep a blocked artery open (stent implant).  Surgery to reroute blood flow around a blocked artery (peripheral bypass surgery).  Surgery to remove dead tissue from an infected wound on the affected limb.  Amputation. This is surgical removal of the affected limb. This may be necessary in cases of acute ischemic limb that are not improved through medical or surgical treatments. HOME CARE INSTRUCTIONS  Take medicines only as directed by your health care provider.  Do not use any tobacco products, including cigarettes, chewing tobacco, or electronic cigarettes. If you need help quitting, ask your health care provider.  Lose weight if you are overweight, and maintain a healthy weight as directed by your health care provider.  Eat a diet that is low in fat and cholesterol. If you need help, ask your health care provider.  Exercise regularly. Ask your health care provider to suggest some good activities for you.  Use compression stockings or other mechanical devices as directed by your health care provider.  Take good care of your feet.  Wear comfortable shoes that fit well.  Check your feet often for any cuts or sores. SEEK MEDICAL CARE IF:  You have cramps in your legs while walking.  You have leg pain when you are at rest.  You have coldness in a leg or foot.  Your skin changes.  You have erectile  dysfunction.  You have cuts or sores on your feet that are not healing. SEEK IMMEDIATE MEDICAL CARE IF:  Your arm or leg turns cold and blue.  Your arms or legs become red, warm, swollen, painful, or numb.  You have chest pain or trouble breathing.  You suddenly have weakness in your face, arm, or leg.  You become very confused or lose the ability to speak.  You suddenly have a very bad headache or lose your vision.   This information is not intended to replace advice given to you by your health care provider. Make sure you discuss any questions you have with your health care provider.   Document Released: 07/10/2004 Document Revised: 06/23/2014 Document Reviewed: 11/10/2013 Elsevier Interactive Patient Education [employer].

## 2015-03-29 NOTE — Progress Notes (Signed)
POST OPERATIVE OFFICE NOTE    CC:  F/u for surgery  HPI:  This is a 79 y.o. female who is s/p Left CEA.  She has no complaints and is doing well since surgery.  She had numbness of her left jaw which is now resolved.  She is taking Plavix and still working on decreasing her smoking habit.  she still smokes 2 cigarettes per day. She has had a previous left femoral to tibioperoneal trunk bypass in 2015 with vein. She's also had a previous left subclavian stent by Dr. Gwenlyn Found in 2013. She denies any numbness tingling or exertional fatigue in her left arm..  Past Surgical History  Procedure Laterality Date  . Appendectomy    . Tonsillectomy    . Tubal ligation    . Cardiac defibrillator placement  05/2008    By Dr Blanch Media, [employer] CANNOT HAVE MRI's  . Abdominal hysterectomy    . Back surgery  2012  . Orif elbow fracture  08/16/2011    Procedure: OPEN REDUCTION INTERNAL FIXATION (ORIF) ELBOW/OLECRANON FRACTURE;  Surgeon: Schuyler Amor, MD;  Location: [place];  Service: Orthopedics;  Laterality: Left;  . Cardiac catheterization  12/01/2007    By Dr. Melvern Banker, left heart cath,   . Coronary angioplasty    . Subclavian stent placement Left 07/31/2011    7x18 Genesis, balloon, with reduction of 90% ostial left subclavian artery stenosis to 0% with residual excellent flow  . Femoral-popliteal bypass graft Right 10/12/2013    Procedure:   Femoral-Peroneal trunk  bypass with nonreversed greater saphenous vein graft;  Surgeon: Elam Dutch, MD;  Location: [place];  Service: Vascular;  Laterality: Right;  . Intraoperative arteriogram Right 10/12/2013    Procedure: INTRA OPERATIVE ARTERIOGRAM;  Surgeon: Elam Dutch, MD;  Location: [place];  Service: Vascular;  Laterality: Right;  . Colonoscopy w/ polypectomy  12/2013  . Esophagogastroduodenoscopy N/A 01/20/2014    Procedure: ESOPHAGOGASTRODUODENOSCOPY (EGD);  Surgeon: Jerene Bears, MD;  Location: [facility] ENDOSCOPY;  Service: Endoscopy;  Laterality: N/A;  .  Givens capsule study N/A 01/20/2014    Procedure: GIVENS CAPSULE STUDY;  Surgeon: Jerene Bears, MD;  Location: [place];  Service: Gastroenterology;  Laterality: N/A;  . Carotid angiogram N/A 07/31/2011    Procedure: CAROTID ANGIOGRAM;  Surgeon: Lorretta Harp, MD;  Location: [facility] CATH LAB;  Service: Cardiovascular;  Laterality: N/A;  carotid angiogram and possible Lt SCA PTA  . Biv icd genertaor change out Left 02/20/2012    Procedure: BIV ICD GENERTAOR CHANGE OUT;  Surgeon: [name], MD;  Location: [facility] CATH LAB;  Service: Cardiovascular;  Laterality: Left;  . Abdominal aortagram N/A 08/12/2013    Procedure: ABDOMINAL Maxcine Ham;  Surgeon: Elam Dutch, MD;  Location: [facility] CATH LAB;  Service: Cardiovascular;  Laterality: N/A;  . Renal angiogram N/A 08/12/2013    Procedure: RENAL ANGIOGRAM;  Surgeon: Elam Dutch, MD;  Location: [facility] CATH LAB;  Service: Cardiovascular;  Laterality: N/A;  . Endarterectomy Left 11/08/2014    Procedure: LEFT CAROTID ENDARTERECTOMY WITH HEMASHIELD PATCH ANGIOPLASTY;  Surgeon: Elam Dutch, MD;  Location: [place];  Service: Vascular;  Laterality: Left;       Allergies   Allergen  Reactions   .  Codeine  Itching   .  Darvon  Itching   .  Meloxicam  Other (See Comments)       Unknown       .  Potassium-Containing Compounds  Other (See Comments)  Causes severe constipation   .  Propoxyphene N-Acetaminophen  Itching   .  Rofecoxib  Other (See Comments)       Unknown       .  Statins  Itching and Other (See Comments)       Sleeplessness   .  Azithromycin  Rash   .  Erythromycin  Rash   .  Penicillins  Rash   .  Sulfa Antibiotics  Rash       Current Outpatient Prescriptions on File Prior to Visit  Medication Sig Dispense Refill  . ALPRAZolam (XANAX) 1 MG tablet TAKE ONE TABLET BY MOUTH EVERY 6 HOURS AS NEEDED (Patient taking differently: TAKE ONE TABLET BY MOUTH EVERY 6 HOURS AS NEEDED FOR ANXIETY) 120 tablet 5  . clopidogrel (PLAVIX) 75 MG  tablet TAKE ONE TABLET BY MOUTH ONCE DAILY 90 tablet 3  . DULoxetine (CYMBALTA) 60 MG capsule TAKE ONE CAPSULE BY MOUTH TWICE DAILY 60 capsule 11  . furosemide (LASIX) 80 MG tablet TAKE ONE TABLET BY MOUTH TWICE DAILY 180 tablet 3  . HYDROcodone-acetaminophen (NORCO) 10-325 MG per tablet Take 1 tablet by mouth every 6 (six) hours as needed for moderate pain. 120 tablet 0  . lisinopril (PRINIVIL,ZESTRIL) 20 MG tablet Take 1 tablet (20 mg total) by mouth daily. 30 tablet 6  . loratadine (CLARITIN) 10 MG tablet Take 10 mg by mouth daily as needed for allergies.    . metoprolol tartrate (LOPRESSOR) 25 MG tablet Take 1 tablet (25 mg total) by mouth daily. 30 tablet 11  . polyethylene glycol (MIRALAX / GLYCOLAX) packet Take 17 g by mouth daily as needed for mild constipation. 30 each 11  . potassium chloride SA (KLOR-CON M20) 20 MEQ tablet Take 20 mEq by mouth 2 (two) times daily.    Marland Kitchen zolpidem (AMBIEN) 10 MG tablet TAKE ONE TABLET BY MOUTH AT BEDTIME 30 tablet 5  . meclizine (ANTIVERT) 25 MG tablet TAKE ONE TABLET BY MOUTH EVERY 4 HOURS AS NEEDED FOR DIZZINESS (Patient not taking: Reported on 03/29/2015) 60 tablet 3  . nitroGLYCERIN (NITROSTAT) 0.4 MG SL tablet Place 1 tablet (0.4 mg total) under the tongue every 5 (five) minutes x 3 doses as needed for chest pain. (Patient not taking: Reported on 12/29/2014) 30 tablet 0   No current facility-administered medications on file prior to visit.    ROS:  she has occasional wheezing. She denies chest pain.  Physical Exam:    Filed Vitals:   03/29/15 1102 03/29/15 1104  BP: 163/74 128/73  Pulse: 85   Height: 5\' 5"  (1.651 m)   Weight: 185 lb (83.915 kg)   SpO2: 93%    Incision:  neck Well healed without erythema or edema Extremities:  2+ right radial pulse absent left radial pulse, no pedal pulses feet pink warm bilaterally Neuro:  cranial nerves intact symmetric upper and lower extremity motor strength which is 5 over 5  Data: Patient had a recent  carotid duplex exam as well as a duplex of her bypass graft on October 13 today. I reviewed and interpreted these studies. No significant velocity increase in her bypass graft which was widely patent. ABI on the right 1.19 left 1.07 carotid duplex exam showed widely patent left internal carotid artery and less than 40% right internal carotid artery stenosis. Suggestion of left subclavian stent occlusion with 40 mm gradient between right and left arms. There is also monophasic waveform in the left subclavian artery  Assessment/Plan:  This is a  79 y.o. female who is s/p: left CEA and s/p right femoral-tibioperoneal trunk bypass graft 10/12/13. These are both widely patent. Left subclavian stent in 2013 with either high-grade stenosis or presumably occluded. Asymptomatic    She will continue to take her Plavix. She'll follow-up with [place] in 6 months time for repeat carotid duplex and a duplex of her bypass graft as well as bilateral ABIs. She will let us know if she develops any symptoms in her left arm if she does then we would consider further imaging of this.  Ruta Hinds, MD [facility] Office: [phone] Pager: [phone]

## 2015-03-30 NOTE — Addendum Note (Signed)
Addended by: Mena Goes on: 03/30/2015 03:07 PM   Modules accepted: Orders

## 2015-03-30 NOTE — Addendum Note (Signed)
Addended by: Mena Goes on: 03/30/2015 03:53 PM   Modules accepted: Orders

## 2015-04-23 ENCOUNTER — Telehealth: Payer: Self-pay | Admitting: Cardiology

## 2015-04-23 ENCOUNTER — Encounter: Payer: Medicare Other | Admitting: *Deleted

## 2015-04-23 NOTE — Telephone Encounter (Signed)
LMOVM reminding pt to send remote transmission.   

## 2015-04-24 ENCOUNTER — Encounter: Payer: Self-pay | Admitting: Cardiology

## 2015-04-30 ENCOUNTER — Ambulatory Visit (INDEPENDENT_AMBULATORY_CARE_PROVIDER_SITE_OTHER): Payer: Medicare Other | Admitting: *Deleted

## 2015-04-30 DIAGNOSIS — I255 Ischemic cardiomyopathy: Secondary | ICD-10-CM

## 2015-05-01 NOTE — Progress Notes (Signed)
Remote ICD transmission.   

## 2015-05-09 LAB — CUP PACEART REMOTE DEVICE CHECK
Battery Voltage: 3 V
Brady Statistic AP VS Percent: 0.03 %
Brady Statistic AS VS Percent: 0.38 %
Brady Statistic RV Percent Paced: 99.59 %
HIGH POWER IMPEDANCE MEASURED VALUE: 66 Ohm
HIGH POWER IMPEDANCE MEASURED VALUE: 855 Ohm
HighPow Impedance: 57 Ohm
Implantable Lead Implant Date: 20091202
Implantable Lead Implant Date: 20091202
Implantable Lead Location: 753859
Implantable Lead Model: 185
Implantable Lead Model: 4194
Implantable Lead Model: 4470
Implantable Lead Serial Number: 635533
Lead Channel Impedance Value: 380 Ohm
Lead Channel Impedance Value: 456 Ohm
Lead Channel Impedance Value: 893 Ohm
Lead Channel Pacing Threshold Amplitude: 0.75 V
Lead Channel Pacing Threshold Amplitude: 1.875 V
Lead Channel Pacing Threshold Pulse Width: 0.4 ms
Lead Channel Pacing Threshold Pulse Width: 0.4 ms
Lead Channel Sensing Intrinsic Amplitude: 13.5 mV
Lead Channel Sensing Intrinsic Amplitude: 13.5 mV
Lead Channel Sensing Intrinsic Amplitude: 4 mV
Lead Channel Setting Pacing Amplitude: 1.5 V
Lead Channel Setting Pacing Amplitude: 2 V
Lead Channel Setting Pacing Amplitude: 2.5 V
Lead Channel Setting Pacing Pulse Width: 0.4 ms
Lead Channel Setting Pacing Pulse Width: 1 ms
MDC IDC LEAD IMPLANT DT: 20091202
MDC IDC LEAD LOCATION: 753858
MDC IDC LEAD LOCATION: 753860
MDC IDC LEAD SERIAL: 308680
MDC IDC MSMT LEADCHNL LV IMPEDANCE VALUE: 665 Ohm
MDC IDC MSMT LEADCHNL LV PACING THRESHOLD PULSEWIDTH: 1 ms
MDC IDC MSMT LEADCHNL RA IMPEDANCE VALUE: 456 Ohm
MDC IDC MSMT LEADCHNL RA PACING THRESHOLD AMPLITUDE: 0.5 V
MDC IDC MSMT LEADCHNL RA SENSING INTR AMPL: 4 mV
MDC IDC SESS DTM: 20161113002336
MDC IDC SET LEADCHNL RV SENSING SENSITIVITY: 0.3 mV
MDC IDC STAT BRADY AP VP PERCENT: 0.17 %
MDC IDC STAT BRADY AS VP PERCENT: 99.42 %
MDC IDC STAT BRADY RA PERCENT PACED: 0.19 %

## 2015-05-16 ENCOUNTER — Encounter: Payer: Self-pay | Admitting: Cardiology

## 2015-05-21 ENCOUNTER — Encounter: Payer: Medicare Other | Admitting: Family Medicine

## 2015-05-23 ENCOUNTER — Other Ambulatory Visit: Payer: Self-pay | Admitting: Family Medicine

## 2015-05-30 ENCOUNTER — Encounter: Payer: Self-pay | Admitting: Family Medicine

## 2015-05-30 ENCOUNTER — Ambulatory Visit (INDEPENDENT_AMBULATORY_CARE_PROVIDER_SITE_OTHER): Payer: Medicare Other | Admitting: Family Medicine

## 2015-05-30 VITALS — BP 150/85 | HR 93 | Ht 65.0 in | Wt 184.0 lb

## 2015-05-30 DIAGNOSIS — E119 Type 2 diabetes mellitus without complications: Secondary | ICD-10-CM

## 2015-05-30 DIAGNOSIS — Z23 Encounter for immunization: Secondary | ICD-10-CM

## 2015-05-30 DIAGNOSIS — Z0000 Encounter for general adult medical examination without abnormal findings: Secondary | ICD-10-CM | POA: Diagnosis not present

## 2015-05-30 DIAGNOSIS — E785 Hyperlipidemia, unspecified: Secondary | ICD-10-CM | POA: Diagnosis not present

## 2015-05-30 LAB — CBC WITH DIFFERENTIAL/PLATELET
BASOS PCT: 0.4 % (ref 0.0–3.0)
Basophils Absolute: 0 10*3/uL (ref 0.0–0.1)
EOS PCT: 2.2 % (ref 0.0–5.0)
Eosinophils Absolute: 0.2 10*3/uL (ref 0.0–0.7)
HCT: 50.7 % — ABNORMAL HIGH (ref 36.0–46.0)
Hemoglobin: 16.5 g/dL — ABNORMAL HIGH (ref 12.0–15.0)
LYMPHS ABS: 2.2 10*3/uL (ref 0.7–4.0)
Lymphocytes Relative: 24.1 % (ref 12.0–46.0)
MCHC: 32.6 g/dL (ref 30.0–36.0)
MCV: 89.7 fl (ref 78.0–100.0)
MONO ABS: 0.9 10*3/uL (ref 0.1–1.0)
Monocytes Relative: 9.4 % (ref 3.0–12.0)
NEUTROS ABS: 5.9 10*3/uL (ref 1.4–7.7)
NEUTROS PCT: 63.9 % (ref 43.0–77.0)
PLATELETS: 350 10*3/uL (ref 150.0–400.0)
RBC: 5.65 Mil/uL — AB (ref 3.87–5.11)
RDW: 14.9 % (ref 11.5–15.5)
WBC: 9.2 10*3/uL (ref 4.0–10.5)

## 2015-05-30 LAB — POCT URINALYSIS DIPSTICK
Bilirubin, UA: NEGATIVE
GLUCOSE UA: NEGATIVE
KETONES UA: NEGATIVE
Leukocytes, UA: NEGATIVE
Nitrite, UA: NEGATIVE
Protein, UA: NEGATIVE
RBC UA: NEGATIVE
SPEC GRAV UA: 1.015
UROBILINOGEN UA: 0.2
pH, UA: 6

## 2015-05-30 LAB — LIPID PANEL
CHOLESTEROL: 228 mg/dL — AB (ref 0–200)
HDL: 33.3 mg/dL — ABNORMAL LOW (ref >39.00)
NONHDL: 195
TRIGLYCERIDES: 262 mg/dL — AB (ref 0.0–149.0)
Total CHOL/HDL Ratio: 7
VLDL: 52.4 mg/dL — ABNORMAL HIGH (ref 0.0–40.0)

## 2015-05-30 LAB — HEPATIC FUNCTION PANEL
ALT: 22 U/L (ref 0–35)
AST: 24 U/L (ref 0–37)
Albumin: 4.3 g/dL (ref 3.5–5.2)
Alkaline Phosphatase: 82 U/L (ref 39–117)
BILIRUBIN DIRECT: 0.1 mg/dL (ref 0.0–0.3)
TOTAL PROTEIN: 7.7 g/dL (ref 6.0–8.3)
Total Bilirubin: 0.6 mg/dL (ref 0.2–1.2)

## 2015-05-30 LAB — BASIC METABOLIC PANEL
BUN: 14 mg/dL (ref 6–23)
CHLORIDE: 96 meq/L (ref 96–112)
CO2: 36 meq/L — AB (ref 19–32)
CREATININE: 1.05 mg/dL (ref 0.40–1.20)
Calcium: 9.9 mg/dL (ref 8.4–10.5)
GFR: 53.97 mL/min — ABNORMAL LOW (ref >60.00)
Glucose, Bld: 169 mg/dL — ABNORMAL HIGH (ref 70–99)
Potassium: 2.9 mEq/L — ABNORMAL LOW (ref 3.5–5.1)
Sodium: 144 mEq/L (ref 135–145)

## 2015-05-30 LAB — HEMOGLOBIN A1C: HEMOGLOBIN A1C: 7.8 % — AB (ref 4.6–6.5)

## 2015-05-30 LAB — LDL CHOLESTEROL, DIRECT: Direct LDL: 152 mg/dL

## 2015-05-30 LAB — TSH: TSH: 2.37 u[IU]/mL (ref 0.35–4.50)

## 2015-05-30 MED ORDER — ALPRAZOLAM 1 MG PO TABS: 1.0000 mg | ORAL_TABLET | Freq: Four times a day (QID) | ORAL | Status: DC | PRN

## 2015-05-30 MED ORDER — METOPROLOL TARTRATE 25 MG PO TABS: 25.0000 mg | ORAL_TABLET | Freq: Every day | ORAL | Status: DC

## 2015-05-30 MED ORDER — METFORMIN HCL 500 MG PO TABS: 500.0000 mg | ORAL_TABLET | Freq: Two times a day (BID) | ORAL | Status: DC

## 2015-05-30 NOTE — Progress Notes (Signed)
Pre visit review using our clinic review tool, if applicable. No additional management support is needed unless otherwise documented below in the visit note. 

## 2015-05-30 NOTE — Progress Notes (Signed)
   Subjective:    Patient ID: Sydney Phillips, female    DOB: [birth year], 79 y.o.   MRN: [id number]  HPI 79 yr old female for a cpx. She is doing well in general. She recently had carotid surgery per Dr. Oneida Alar, and this was very successful. She says she feels more energy now and better all around. She does note that her glucoses have been trending upward despite her efforts to reduce the carbs in her diet. She attributes some of this to her inability to exercise. She recently had some random readings in te 300s. Her last A1c last June was 7.0.    Review of Systems  Constitutional: Negative.   HENT: Negative.   Eyes: Negative.   Respiratory: Negative.   Cardiovascular: Negative.   Gastrointestinal: Negative.   Genitourinary: Negative for dysuria, urgency, frequency, hematuria, flank pain, decreased urine volume, enuresis, difficulty urinating, pelvic pain and dyspareunia.  Musculoskeletal: Negative.   Skin: Negative.   Neurological: Negative.   Psychiatric/Behavioral: Negative.        Objective:   Physical Exam  Constitutional: She is oriented to person, place, and time. She appears well-developed and well-nourished. No distress.  HENT:  Head: Normocephalic and atraumatic.  Right Ear: External ear normal.  Left Ear: External ear normal.  Nose: Nose normal.  Mouth/Throat: Oropharynx is clear and moist. No oropharyngeal exudate.  Eyes: Conjunctivae and EOM are normal. Pupils are equal, round, and reactive to light. No scleral icterus.  Neck: Normal range of motion. Neck supple. No JVD present. No thyromegaly present.  Cardiovascular: Normal rate, regular rhythm, normal heart sounds and intact distal pulses.  Exam reveals no gallop and no friction rub.   No murmur heard. Pulmonary/Chest: Effort normal and breath sounds normal. No respiratory distress. She has no wheezes. She has no rales. She exhibits no tenderness.  Abdominal: Soft. Bowel sounds are normal. She exhibits no  distension and no mass. There is no tenderness. There is no rebound and no guarding.  Musculoskeletal: Normal range of motion. She exhibits no edema or tenderness.  Lymphadenopathy:    She has no cervical adenopathy.  Neurological: She is alert and oriented to person, place, and time. She has normal reflexes. No cranial nerve deficit. She exhibits normal muscle tone. Coordination normal.  Skin: Skin is warm and dry. No rash noted. No erythema.  Psychiatric: She has a normal mood and affect. Her behavior is normal. Judgment and thought content normal.          Assessment & Plan:  Well exam. She will get fasting labs today including an A1c. I think it is time to begin treatment of her diabetes, so we will start her on Metformin 500 mg bid. We discussed diet and exercise.

## 2015-06-01 ENCOUNTER — Other Ambulatory Visit: Payer: Self-pay | Admitting: Family Medicine

## 2015-06-01 MED ORDER — POTASSIUM CHLORIDE CRYS ER 20 MEQ PO TBCR: 20.0000 meq | EXTENDED_RELEASE_TABLET | Freq: Three times a day (TID) | ORAL | Status: DC

## 2015-06-21 ENCOUNTER — Telehealth: Payer: Self-pay | Admitting: Family Medicine

## 2015-06-21 NOTE — Telephone Encounter (Signed)
Bree with [employer] called on the behalf of the patient to check on the status of a sit down walker. Pt called [employer] yesterday to check on it. 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 Please advise.

## 2015-06-21 NOTE — Telephone Encounter (Signed)
I spoke with pt and she does need another script wrote. We gave her a script during last office visit, however insurance said the she didn't need it. She does indeed need script. She will call us back with name of medical supply company and hopefully we can fax over the order, if not then pt will pick it up.

## 2015-06-21 NOTE — Telephone Encounter (Signed)
rx is written.

## 2015-06-22 NOTE — Telephone Encounter (Signed)
Pt call to say that [facility] need the rx fax to [place] [phone]

## 2015-06-22 NOTE — Telephone Encounter (Signed)
Rx was sent  

## 2015-06-27 ENCOUNTER — Encounter: Payer: Self-pay | Admitting: Family Medicine

## 2015-06-27 ENCOUNTER — Ambulatory Visit (INDEPENDENT_AMBULATORY_CARE_PROVIDER_SITE_OTHER): Payer: Medicare Other | Admitting: Family Medicine

## 2015-06-27 VITALS — BP 131/88 | HR 99 | Temp 98.2°F | Ht 65.0 in | Wt 184.0 lb

## 2015-06-27 DIAGNOSIS — M62838 Other muscle spasm: Secondary | ICD-10-CM

## 2015-06-27 MED ORDER — METHOCARBAMOL 500 MG PO TABS: 500.0000 mg | ORAL_TABLET | Freq: Four times a day (QID) | ORAL | Status: DC | PRN

## 2015-06-27 MED ORDER — CLOPIDOGREL BISULFATE 75 MG PO TABS: 75.0000 mg | ORAL_TABLET | Freq: Every day | ORAL | Status: DC

## 2015-06-27 MED ORDER — MAGNESIUM OXIDE 400 MG PO TABS: 400.0000 mg | ORAL_TABLET | Freq: Every day | ORAL | Status: DC

## 2015-06-27 NOTE — Progress Notes (Signed)
Pre visit review using our clinic review tool, if applicable. No additional management support is needed unless otherwise documented below in the visit note. 

## 2015-06-27 NOTE — Progress Notes (Signed)
   Subjective:    Patient ID: Sydney Phillips, female    DOB: 1936/01/03, 80 y.o.   MRN: [id number]  HPI Here to discuss leg cramps and muscle spasms. These tend to occur in the evenings or during the night. They involve the calves primarily.    Review of Systems  Constitutional: Negative.   Respiratory: Negative.   Cardiovascular: Negative.   Musculoskeletal: Positive for myalgias.       Objective:   Physical Exam  Constitutional: She is oriented to person, place, and time. She appears well-developed and well-nourished.  Cardiovascular: Normal rate, regular rhythm, normal heart sounds and intact distal pulses.   Pulmonary/Chest: Effort normal and breath sounds normal.  Neurological: She is alert and oriented to person, place, and time.          Assessment & Plan:  Muscle spasms, try Robaxin 500 mg QID prn and start on magnesium tabs daily.

## 2015-07-24 ENCOUNTER — Other Ambulatory Visit: Payer: Self-pay | Admitting: Family Medicine

## 2015-07-30 ENCOUNTER — Ambulatory Visit (INDEPENDENT_AMBULATORY_CARE_PROVIDER_SITE_OTHER): Payer: Medicare Other | Admitting: *Deleted

## 2015-07-30 ENCOUNTER — Telehealth: Payer: Self-pay | Admitting: Cardiology

## 2015-07-30 DIAGNOSIS — I255 Ischemic cardiomyopathy: Secondary | ICD-10-CM

## 2015-07-30 NOTE — Progress Notes (Signed)
Remote ICD transmission.   

## 2015-07-30 NOTE — Telephone Encounter (Signed)
LMOVM reminding pt to send remote transmission.   

## 2015-08-01 ENCOUNTER — Encounter: Payer: Self-pay | Admitting: Cardiology

## 2015-08-01 LAB — CUP PACEART REMOTE DEVICE CHECK
Brady Statistic AP VP Percent: 0.29 %
Brady Statistic AS VP Percent: 99.4 %
Brady Statistic RA Percent Paced: 0.32 %
Brady Statistic RV Percent Paced: 99.69 %
Date Time Interrogation Session: 20170213215522
HIGH POWER IMPEDANCE MEASURED VALUE: 60 Ohm
HighPow Impedance: 66 Ohm
HighPow Impedance: 969 Ohm
Implantable Lead Implant Date: 20091202
Implantable Lead Location: 753858
Implantable Lead Location: 753860
Implantable Lead Serial Number: 308680
Implantable Lead Serial Number: 635533
Lead Channel Impedance Value: 437 Ohm
Lead Channel Impedance Value: 703 Ohm
Lead Channel Impedance Value: 969 Ohm
Lead Channel Pacing Threshold Amplitude: 0.375 V
Lead Channel Pacing Threshold Amplitude: 1.5 V
Lead Channel Sensing Intrinsic Amplitude: 27.375 mV
Lead Channel Sensing Intrinsic Amplitude: 4.875 mV
Lead Channel Setting Pacing Amplitude: 2 V
Lead Channel Setting Pacing Pulse Width: 0.4 ms
Lead Channel Setting Sensing Sensitivity: 0.3 mV
MDC IDC LEAD IMPLANT DT: 20091202
MDC IDC LEAD IMPLANT DT: 20091202
MDC IDC LEAD LOCATION: 753859
MDC IDC LEAD MODEL: 185
MDC IDC LEAD MODEL: 4194
MDC IDC LEAD MODEL: 4470
MDC IDC MSMT BATTERY VOLTAGE: 2.97 V
MDC IDC MSMT LEADCHNL LV IMPEDANCE VALUE: 380 Ohm
MDC IDC MSMT LEADCHNL LV IMPEDANCE VALUE: 456 Ohm
MDC IDC MSMT LEADCHNL LV PACING THRESHOLD PULSEWIDTH: 1 ms
MDC IDC MSMT LEADCHNL RA PACING THRESHOLD PULSEWIDTH: 0.4 ms
MDC IDC MSMT LEADCHNL RA SENSING INTR AMPL: 4.875 mV
MDC IDC MSMT LEADCHNL RV PACING THRESHOLD AMPLITUDE: 0.75 V
MDC IDC MSMT LEADCHNL RV PACING THRESHOLD PULSEWIDTH: 0.4 ms
MDC IDC MSMT LEADCHNL RV SENSING INTR AMPL: 27.375 mV
MDC IDC SET LEADCHNL LV PACING AMPLITUDE: 2.5 V
MDC IDC SET LEADCHNL LV PACING PULSEWIDTH: 1 ms
MDC IDC SET LEADCHNL RA PACING AMPLITUDE: 1.5 V
MDC IDC STAT BRADY AP VS PERCENT: 0.03 %
MDC IDC STAT BRADY AS VS PERCENT: 0.28 %

## 2015-08-06 DIAGNOSIS — J439 Emphysema, unspecified: Secondary | ICD-10-CM | POA: Diagnosis not present

## 2015-08-06 DIAGNOSIS — M5127 Other intervertebral disc displacement, lumbosacral region: Secondary | ICD-10-CM | POA: Diagnosis not present

## 2015-08-08 ENCOUNTER — Other Ambulatory Visit: Payer: Self-pay | Admitting: Family Medicine

## 2015-09-24 ENCOUNTER — Encounter: Payer: Self-pay | Admitting: Cardiology

## 2015-10-12 ENCOUNTER — Encounter: Payer: Self-pay | Admitting: Family Medicine

## 2015-10-12 ENCOUNTER — Ambulatory Visit (INDEPENDENT_AMBULATORY_CARE_PROVIDER_SITE_OTHER): Payer: Medicare Other | Admitting: Family Medicine

## 2015-10-12 VITALS — BP 149/87 | HR 83 | Temp 98.4°F | Ht 65.0 in | Wt 182.0 lb

## 2015-10-12 DIAGNOSIS — M2550 Pain in unspecified joint: Secondary | ICD-10-CM | POA: Insufficient documentation

## 2015-10-12 DIAGNOSIS — J439 Emphysema, unspecified: Secondary | ICD-10-CM | POA: Diagnosis not present

## 2015-10-12 MED ORDER — ALBUTEROL SULFATE HFA 108 (90 BASE) MCG/ACT IN AERS: 2.0000 | INHALATION_SPRAY | RESPIRATORY_TRACT | Status: DC | PRN

## 2015-10-12 MED ORDER — NITROGLYCERIN 0.4 MG SL SUBL: 0.4000 mg | SUBLINGUAL_TABLET | SUBLINGUAL | Status: DC | PRN

## 2015-10-12 NOTE — Progress Notes (Signed)
   Subjective:    Patient ID: Sydney Phillips, female    DOB: 11-22-1935, 80 y.o.   MRN: [id number]  HPI Here for several things. First the spring pollen has been bothering her and causing wheezing and SOB, especially when she works out in her yard. No fever or coughing. Also she has had intermttent swelling and pain in her feet ans she asks if she can be tested for gout. Her feet never turn red or feel warm. She has known osteoarthritis in multiple joints.    Review of Systems  Constitutional: Negative.   HENT: Negative.   Respiratory: Positive for choking, chest tightness and shortness of breath.   Cardiovascular: Negative.   Musculoskeletal: Positive for joint swelling.       Objective:   Physical Exam  Constitutional: She is oriented to person, place, and time. She appears well-developed and well-nourished.  Walks with a cane   Neck: No thyromegaly present.  Cardiovascular: Normal rate, regular rhythm, normal heart sounds and intact distal pulses.   Pulmonary/Chest: Effort normal and breath sounds normal. No respiratory distress. She has no wheezes. She has no rales.  Lymphadenopathy:    She has no cervical adenopathy.  Neurological: She is alert and oriented to person, place, and time.          Assessment & Plan:  I do not think she has actual gout but we will check a uric acid level today. She can  Use her pain meds prn. The pollen outside has exacerbated her COPD, so she will try a Ventolin HFA innhaler prn. Laurey Morale, MD

## 2015-10-12 NOTE — Progress Notes (Signed)
Pre visit review using our clinic review tool, if applicable. No additional management support is needed unless otherwise documented below in the visit note. 

## 2015-10-13 LAB — URIC ACID: Uric Acid, Serum: 8.5 mg/dL — ABNORMAL HIGH (ref 2.4–7.0)

## 2015-10-15 MED ORDER — ALLOPURINOL 300 MG PO TABS: 300.0000 mg | ORAL_TABLET | Freq: Every day | ORAL | Status: DC

## 2015-10-15 NOTE — Addendum Note (Signed)
Addended by: Aggie Hacker A on: 10/15/2015 05:09 PM   Modules accepted: Orders

## 2015-10-25 ENCOUNTER — Telehealth: Payer: Self-pay | Admitting: Family Medicine

## 2015-10-25 NOTE — Telephone Encounter (Signed)
Patient Name: [facility] Kuznicki  DOB: 1935/07/02    Initial Comment Caller States BP 169/96   Nurse Assessment  Nurse: Raphael Gibney, RN, Vera Date/Time (Eastern Time): 10/25/2015 8:44:15 AM  Confirm and document reason for call. If symptomatic, describe symptoms. You must click the next button to save text entered. ---Caller states her BP was 160s-/100s. Diastolic BP Q000111Q yesterday. . BP 168/105 yesterday. BP today 169/96 today HR 107-129 yesterday. She gets SOB every once in a while.  Has the patient traveled out of the country within the last 30 days? ---No  Does the patient have any new or worsening symptoms? ---Yes  Will a triage be completed? ---Yes  Related visit to physician within the last 2 weeks? ---Yes  Does the PT have any chronic conditions? (i.e. diabetes, asthma, etc.) ---Yes  List chronic conditions. ---HTN; uric acid; COPD; diabetes. fibromyalgia  Is this a behavioral health or substance abuse call? ---No     Guidelines    Guideline Title Affirmed Question Affirmed Notes  High Blood Pressure [1] BP ? 140/90 AND [2] taking BP medications    Final Disposition User   See PCP within 2 Maudry Diego, RN, Vanita Ingles    Comments  Appt scheduled for 10/26/15 at 10:30 am with Dr. Alysia Penna.   Disagree/Comply: Comply

## 2015-10-25 NOTE — Telephone Encounter (Signed)
Appt scheduled 10/26/15

## 2015-10-26 ENCOUNTER — Telehealth: Payer: Self-pay | Admitting: Family Medicine

## 2015-10-26 ENCOUNTER — Ambulatory Visit: Payer: Self-pay | Admitting: Family Medicine

## 2015-10-26 NOTE — Telephone Encounter (Signed)
Pt called back to advise she did not have anyway to get here today for her appointment with Dr Sarajane Jews, and she has been asleep.  Pt states she called her sister yesterday, whose daughter is a Marine scientist. Her niece advised her to get some B6 and Vit C. Pt is taking the Vit B 6 in the am and the Vit C at night. Also advised pt to take 2 TBLS vinegar and drink a glass of water to bring down her Bp which she did.  Pt had not checked hern bp today.  Stayed on the phone while pt took her bp and it   was 172/94. Pt will wait for your call.

## 2015-10-26 NOTE — Telephone Encounter (Signed)
Pt was on the schedule to see Dr. Sarajane Jews today, however did not show. I left a voice message for pt to call us back to follow up on blood pressure reading, there was a triage call about this as well.

## 2015-10-26 NOTE — Telephone Encounter (Signed)
I tried to call pt again and it just rings then machine comes on. I do not see where pt went to any other [facility] facility.

## 2015-10-26 NOTE — Telephone Encounter (Signed)
Can you review triage phone note and advise on how to proceed?

## 2015-10-29 ENCOUNTER — Ambulatory Visit (INDEPENDENT_AMBULATORY_CARE_PROVIDER_SITE_OTHER): Payer: Medicare Other | Admitting: Family Medicine

## 2015-10-29 ENCOUNTER — Encounter: Payer: Self-pay | Admitting: Family Medicine

## 2015-10-29 VITALS — BP 162/91 | HR 99 | Temp 98.9°F | Ht 65.0 in | Wt 186.0 lb

## 2015-10-29 DIAGNOSIS — I10 Essential (primary) hypertension: Secondary | ICD-10-CM

## 2015-10-29 DIAGNOSIS — J439 Emphysema, unspecified: Secondary | ICD-10-CM | POA: Diagnosis not present

## 2015-10-29 MED ORDER — LISINOPRIL 20 MG PO TABS: 20.0000 mg | ORAL_TABLET | Freq: Every day | ORAL | Status: DC

## 2015-10-29 MED ORDER — FUROSEMIDE 80 MG PO TABS: 80.0000 mg | ORAL_TABLET | Freq: Two times a day (BID) | ORAL | Status: DC

## 2015-10-29 NOTE — Telephone Encounter (Signed)
Called and confirmed with patient she is aware of appointment today with Dr. Sarajane Jews at 4:00pm. She is aware and plans to arrive.

## 2015-10-29 NOTE — Progress Notes (Signed)
Pre visit review using our clinic review tool, if applicable. No additional management support is needed unless otherwise documented below in the visit note. 

## 2015-10-29 NOTE — Progress Notes (Signed)
   Subjective:    Patient ID: Sydney Phillips, female    DOB: [birth year], 80 y.o.   MRN: [id number]  HPI Here to follow up on high BP readings at home. She had been doing well with this until late last year when I see in her chart that the BP had increased suddenly between visits in October. She has been getting systolic readings of Q000111Q to 160s at home. She then looked at her medication list and realized she was no longer taking Lisinopril as she used to. In fact this was apparently stopped somehow last October, and this fits perfectly with the time frame that her BP was going up. She feels well in general. She drove herself here today.    Review of Systems  Constitutional: Negative.   Respiratory: Negative.   Cardiovascular: Negative.   Neurological: Negative.        Objective:   Physical Exam  Constitutional: She appears well-developed and well-nourished. No distress.  Neck: No thyromegaly present.  Cardiovascular: Normal rate, regular rhythm, normal heart sounds and intact distal pulses.   Pulmonary/Chest: Effort normal and breath sounds normal.  Lymphadenopathy:    She has no cervical adenopathy.          Assessment & Plan:  Her HTN is not well controlled and this correlates to when the Lisinopril was stopped last year. We will get back on Lisinopril 20 mg a day. She will let us know in 3 weeks what the BP is doing at home. Laurey Morale, MD

## 2015-11-07 ENCOUNTER — Ambulatory Visit (INDEPENDENT_AMBULATORY_CARE_PROVIDER_SITE_OTHER): Payer: Medicare Other | Admitting: Cardiovascular Disease

## 2015-11-07 ENCOUNTER — Encounter: Payer: Self-pay | Admitting: Cardiovascular Disease

## 2015-11-07 DIAGNOSIS — F17200 Nicotine dependence, unspecified, uncomplicated: Secondary | ICD-10-CM

## 2015-11-07 DIAGNOSIS — Z720 Tobacco use: Secondary | ICD-10-CM | POA: Diagnosis not present

## 2015-11-07 DIAGNOSIS — I2510 Atherosclerotic heart disease of native coronary artery without angina pectoris: Secondary | ICD-10-CM

## 2015-11-07 DIAGNOSIS — Z95810 Presence of automatic (implantable) cardiac defibrillator: Secondary | ICD-10-CM | POA: Diagnosis not present

## 2015-11-07 DIAGNOSIS — I429 Cardiomyopathy, unspecified: Secondary | ICD-10-CM | POA: Diagnosis not present

## 2015-11-07 NOTE — Patient Instructions (Signed)
Dr Sallyanne Kuster recommends that you continue on your current medications as directed. Please refer to the Current Medication list given to you today.  Remote monitoring is used to monitor your Pacemaker of ICD from home. This monitoring reduces the number of office visits required to check your device to one time per year. It allows [place] to keep an eye on the functioning of your device to ensure it is working properly. You are scheduled for a device check from home on Friday, August 25th, 2017. You may send your transmission at any time that day. If you have a wireless device, the transmission will be sent automatically. After your physician reviews your transmission, you will receive a postcard with your next transmission date.   Dr Sallyanne Kuster recommends that you schedule a follow-up appointment in 6 months with a defibrillator check. You will receive a reminder letter in the mail two months in advance. If you don't receive a letter, please call our office to schedule the follow-up appointment.  If you need a refill on your cardiac medications before your next appointment, please call your pharmacy.

## 2015-11-07 NOTE — Progress Notes (Signed)
Patient ID: Sydney Phillips, female   DOB: 1935-11-05, 80 y.o.   MRN: [id number]    Cardiology Office Note    Date:  11/07/2015   ID:  Sydney Phillips, DOB [birth year], MRN [id number]  PCP:  Laurey Morale, MD  Cardiologist:   [name], MD   Chief Complaint  Patient presents with  . Annual Exam    patient reports shortness of breath with minimal exertion. she also feels that she "over medicated" and reports a fall on sunday, she has been having leg spasms since sunday.    History of Present Illness:  Sydney Phillips is a 80 y.o. female with cardiomyopathy here for a check on her CRT-D device. She is a CRT "hyper-responder".  Since her last appointment she underwent carotid endarterectomy on the left side. This was a little more involved than expected since she appeared to have an active thrombus in the artery at the time of surgery, but was performed without complications. Lisinopril was stopped after the carotid surgery due to hypotension. I think this was supposed to be a temporary change, but unfortunately her lisinopril was not started until a few weeks ago. She did not have any problems with clinical congestive heart failure exacerbation while she was off the lisinopril, but her device does show some marked decreases in thoracic impedance throughout the months of February and April. In fact, her thoracic impedance has just returned to normal maybe a few days before today's office appointment.  Otherwise she has normal device interrogation parameters. She has 99.6% biventricular pacing success. There have been no episodes of ventricular tachycardia or atrial fibrillation. Atrial pacing occurs only 0.3% of the time. Her device does not report expected generator longevity but I would anticipate about another 3-4 years from today's generator voltage. Activity level is relatively constant at about 1 hour a day.  She denies angina pectoris, leg edema, worsening shortness of breath,  palpitations or syncope. She had a mechanical fall into her rose bushes a few days ago; she has not been dizzy or lightheaded. Has chronic NYHA functional class II exertional dyspnea. She continues to smoke and does not report any plans to quit.  She has a history of cardiomyopathy that was felt to be of mixed etiology. Her ejection fraction in 2009 was 20%. Catheterization revealed a 75% LAD and a 75% right coronary artery. She had a biventricular ICD placed at that time. Last echocardiogram in February of 2013 showed an ejection fraction of 99991111, grade 1 diastolic dysfunction and mild mitral regurgitation. Myoview in March of 2012 showed an ejection fraction of 55% and normal perfusion. Patient also has peripheral vascular disease. Arteriogram in February 2013 showed a 70% right internal carotid artery stenosis. There was a 30-40% left carotid stenosis. There was a 90% left subclavian. Patient had stent of her left subclavian at that time. In August 2015 she had a low risk myocardial perfusion study with no ischemia and EF 56%. Similar LVEF by echo.  Past Medical History  Diagnosis Date  . COPD (chronic obstructive pulmonary disease) ([place])   . Hx of colonic polyps   . Insomnia   . Fibromyalgia   . Hyperlipemia     takes Crestor daily  . CAD (coronary artery disease)     Currently angina free, no evidence of reversible ischemia  . Vertigo     takes Meclizine prn  . Subclavian arterial stenosis, lt, with PTA/STENT 07/31/11 08/01/2011  . S/P angioplasty with stent, lt. subclavian 07/31/11  08/01/2011  . Hypertension     takes Amlodipine and Metoprolol daily  . Bronchitis   . Arthritis   . Chronic back pain   . GERD (gastroesophageal reflux disease)     was on meds but was taken off;now watches what she eats  . Chronic constipation   . Hemorrhoids   . Urinary incontinence   . Early cataracts, bilateral   . Depression with anxiety     takes Cymbalta daily  . Congestive heart failure (CHF)  (HCC)     New York Heart Association functional class 2, diastolic dysfunction  . PAD (peripheral artery disease) (HCC)     Carotid, subclavian, and lower extremity beds, currently not symptomatic  . Syncope 07/28/2011    EF - 50-55, moderate concentric hypertrophy in left ventricle  . LBBB (left bundle branch block)     Stress test 09/03/2010, EF 55  . Automatic implantable cardioverter-defibrillator in situ   . Presence of combination internal cardiac defibrillator (ICD) and pacemaker   . Shortness of breath     when over exerting self per pt.  . Colon polyps 2003.  2015.    HP polyps 2003.  adnomas 2015.  required referal to [facility] for colonoscopic resection of flat polyps.   . Presence of permanent cardiac pacemaker   . Diabetes mellitus without complication (HCC)     no meds  . History of kidney stones   . Myocardial infarction [facility])     Past Surgical History  Procedure Laterality Date  . Appendectomy    . Tonsillectomy    . Tubal ligation    . Cardiac defibrillator placement  05/2008    By Dr Blanch Media, [employer] CANNOT HAVE MRI's  . Abdominal hysterectomy    . Back surgery  2012  . Orif elbow fracture  08/16/2011    Procedure: OPEN REDUCTION INTERNAL FIXATION (ORIF) ELBOW/OLECRANON FRACTURE;  Surgeon: Schuyler Amor, MD;  Location: [place];  Service: Orthopedics;  Laterality: Left;  . Cardiac catheterization  12/01/2007    By Dr. Melvern Banker, left heart cath,   . Coronary angioplasty    . Subclavian stent placement Left 07/31/2011    7x18 Genesis, balloon, with reduction of 90% ostial left subclavian artery stenosis to 0% with residual excellent flow  . Femoral-popliteal bypass graft Right 10/12/2013    Procedure:   Femoral-Peroneal trunk  bypass with nonreversed greater saphenous vein graft;  Surgeon: Elam Dutch, MD;  Location: [place];  Service: Vascular;  Laterality: Right;  . Intraoperative arteriogram Right 10/12/2013    Procedure: INTRA OPERATIVE ARTERIOGRAM;  Surgeon:  Elam Dutch, MD;  Location: [place];  Service: Vascular;  Laterality: Right;  . Colonoscopy w/ polypectomy  12/2013  . Esophagogastroduodenoscopy N/A 01/20/2014    Procedure: ESOPHAGOGASTRODUODENOSCOPY (EGD);  Surgeon: Jerene Bears, MD;  Location[facility] ENDOSCOPY;  Service: Endoscopy;  Laterality: N/A;  . Givens capsule study N/A 01/20/2014    Procedure: GIVENS CAPSULE STUDY;  Surgeon: Jerene Bears, MD;  Location: [place];  Service: Gastroenterology;  Laterality: N/A;  . Carotid angiogram N/A 07/31/2011    Procedure: CAROTID ANGIOGRAM;  Surgeon: Lorretta Harp, MD;  Location: [facility] CATH LAB;  Service: Cardiovascular;  Laterality: N/A;  carotid angiogram and possible Lt SCA PTA  . Biv icd genertaor change out Left 02/20/2012    Procedure: BIV ICD GENERTAOR CHANGE OUT;  Surgeon: [name], MD;  Location: [facility] CATH LAB;  Service: Cardiovascular;  Laterality: Left;  . Abdominal aortagram N/A 08/12/2013  Procedure: ABDOMINAL AORTAGRAM;  Surgeon: Elam Dutch, MD;  Location: [facility] CATH LAB;  Service: Cardiovascular;  Laterality: N/A;  . Renal angiogram N/A 08/12/2013    Procedure: RENAL ANGIOGRAM;  Surgeon: Elam Dutch, MD;  Location: [facility] CATH LAB;  Service: Cardiovascular;  Laterality: N/A;  . Endarterectomy Left 11/08/2014    Procedure: LEFT CAROTID ENDARTERECTOMY WITH HEMASHIELD PATCH ANGIOPLASTY;  Surgeon: Elam Dutch, MD;  Location: [facility] OR;  Service: Vascular;  Laterality: Left;    Current Medications: Outpatient Prescriptions Prior to Visit  Medication Sig Dispense Refill  . ACCU-CHEK FASTCLIX LANCETS MISC CHECK BLOOD SUGAR ONCE DAILY 102 each 1  . ACCU-CHEK SMARTVIEW test strip CHECK BLOOD SUGAR ONCE DAILY 100 each 2  . albuterol (PROVENTIL HFA;VENTOLIN HFA) 108 (90 Base) MCG/ACT inhaler Inhale 2 puffs into the lungs every 4 (four) hours as needed for wheezing or shortness of breath. 1 Inhaler 5  . allopurinol (ZYLOPRIM) 300 MG tablet Take 1 tablet (300 mg total) by mouth daily. 30  tablet 11  . ALPRAZolam (XANAX) 1 MG tablet Take 1 tablet (1 mg total) by mouth every 6 (six) hours as needed. for anxiety 120 tablet 5  . clopidogrel (PLAVIX) 75 MG tablet Take 1 tablet (75 mg total) by mouth daily. 90 tablet 3  . DULoxetine (CYMBALTA) 60 MG capsule TAKE ONE CAPSULE BY MOUTH TWICE DAILY 60 capsule 11  . furosemide (LASIX) 80 MG tablet Take 1 tablet (80 mg total) by mouth 2 (two) times daily. 180 tablet 3  . lisinopril (PRINIVIL,ZESTRIL) 20 MG tablet Take 1 tablet (20 mg total) by mouth daily. 90 tablet 3  . magnesium oxide (MAG-OX) 400 MG tablet Take 1 tablet (400 mg total) by mouth daily. (Patient taking differently: Take 400 mg by mouth 2 (two) times daily. ) 1 tablet 0  . metFORMIN (GLUCOPHAGE) 500 MG tablet Take 1 tablet (500 mg total) by mouth 2 (two) times daily with a meal. 180 tablet 3  . metoprolol tartrate (LOPRESSOR) 25 MG tablet Take 1 tablet (25 mg total) by mouth daily. 90 tablet 3  . nitroGLYCERIN (NITROSTAT) 0.4 MG SL tablet Place 1 tablet (0.4 mg total) under the tongue every 5 (five) minutes x 3 doses as needed for chest pain. 30 tablet 11  . potassium chloride SA (KLOR-CON M20) 20 MEQ tablet Take 1 tablet (20 mEq total) by mouth 3 (three) times daily. (Patient taking differently: Take 20 mEq by mouth 2 (two) times daily. ) 270 tablet 3  . Triamcinolone Acetonide (NASACORT AQ NA) Place into the nose daily. Over the counter     No facility-administered medications prior to visit.     Allergies:   Codeine; Darvon; Meloxicam; Norco; Potassium-containing compounds; Propoxyphene n-acetaminophen; Rofecoxib; Rosuvastatin; Statins; Azithromycin; Erythromycin; Penicillins; and Sulfa antibiotics   Social History   Social History  . Marital Status: Widowed    Spouse Name: N/A  . Number of Children: 3  . Years of Education: N/A   Social History Main Topics  . Smoking status: Current Every Day Smoker -- 0.25 packs/day for 60 years    Types: Cigarettes  . Smokeless  tobacco: Never Used     Comment: 1/2 pack per day  . Alcohol Use: No  . Drug Use: No     Comment: 4-1/2 pk  . Sexual Activity: No   Other Topics Concern  . None   Social History Narrative   Ok to share information with medical POA, Son Gabriel Carina     Family  History:  The patient's family history includes CAD in her father; Cancer in her sister; Colon cancer in her maternal grandmother; Deep vein thrombosis in her son; Diabetes in her sister; Heart disease in her father, mother, and sister; Hyperlipidemia in her father. There is no history of Anesthesia problems, Hypotension, Malignant hyperthermia, or Pseudochol deficiency.   ROS:   Please see the history of present illness.    ROS All other systems reviewed and are negative.   PHYSICAL EXAM:   VS:  BP 102/60 mmHg  Pulse 80  Ht 5\' 5"  (1.651 m)  Wt 83.008 kg (183 lb)  BMI 30.45 kg/m2   GEN: Well nourished, well developed, in no acute distress HEENT: normal Neck: no JVD, carotid bruits, or masses Cardiac: Paradoxically split  S2, RRR; no murmurs, rubs, or gallops,no edema  Respiratory:  clear to auscultation bilaterally, normal work of breathing GI: soft, nontender, nondistended, + BS MS: no deformity or atrophy Skin: warm and dry, no rash Neuro:  Alert and Oriented x 3, Strength and sensation are intact Psych: euthymic mood, full affect  Wt Readings from Last 3 Encounters:  11/07/15 83.008 kg (183 lb)  10/29/15 84.369 kg (186 lb)  10/12/15 82.555 kg (182 lb)      Studies/Labs Reviewed:   EKG:  EKG is ordered today.  The ekg ordered today demonstrates Atrial sensed ventricular paced rhythm. Although there is no prominent R wave in lead V1, the QRS is remarkably narrow. Patient carries 112 ms.  Recent Labs: 05/30/2015: ALT 22; BUN 14; Creatinine, Ser 1.05; Hemoglobin 16.5*; Platelets 350.0; Potassium 2.9*; Sodium 144; TSH 2.37   Lipid Panel    Component Value Date/Time   CHOL 228* 05/30/2015 1452   TRIG 262.0*  05/30/2015 1452   HDL 33.30* 05/30/2015 1452   CHOLHDL 7 05/30/2015 1452   VLDL 52.4* 05/30/2015 1452   LDLCALC 148* 01/21/2014 0541   LDLDIRECT 152.0 05/30/2015 1452    Additional studies/ records that were reviewed today include:   Notes from Dr. Oneida Alar and Dr. Sarajane Jews   ASSESSMENT:    1. Biventricular implantable cardioverter-defibrillator in situ   2. Cardiomyopathy ([place])   3. Atherosclerosis of native coronary artery without angina pectoris, unspecified whether native or transplanted heart   4. Smoking   5.      PAD s/p L carotid endarterectomy, known L subclavian stenosis   PLAN:  In order of problems listed above:  1. Normal CRT-D device function. Potential for electrocautery interference at time of surgery, appropriate reprogramming before surgery is necessary (tachy detection off).  2. Mixed ischemic/nonischemic cardiomyopathy - appears euvolemic by clinical criteria and thoracic impedance. Fluid accumulation in April has resolved. Normal LVEF by most recent assessment. Optimal fluid weight by her home scale seemed to be < 180 lb or soLast year, but today she weighs 183 pounds and appears clinically euvolemic..  3. CAD - No recent angina, low risk recent nuclear scan  4. Tobacco abuse - last year reported strong intentions to quit smoking. She seems to have lost or confused as him for this. Encouraged to pursue this as soon and as possible.  5. PAD - involving both carotid and subclavian circulation  6. Hyperlipidemia -  I'm worried about the risk for disease progressionboth in the coronary and the preferred arterial circulation,  since she continues to smoke and is not taking any lipid-lowering medications. I think we should try to see if she qualifies for PCS K9 inhibitors.Intolerant to numerous statins in the past.  Medication Adjustments/Labs and Tests Ordered: Current medicines are reviewed at length with the patient today.  Concerns regarding medicines are  outlined above.  Medication changes, Labs and Tests ordered today are listed in the Patient Instructions below. Patient Instructions  Dr Sallyanne Kuster recommends that you continue on your current medications as directed. Please refer to the Current Medication list given to you today.  Remote monitoring is used to monitor your Pacemaker of ICD from home. This monitoring reduces the number of office visits required to check your device to one time per year. It allows [place] to keep an eye on the functioning of your device to ensure it is working properly. You are scheduled for a device check from home on Friday, August 25th, 2017. You may send your transmission at any time that day. If you have a wireless device, the transmission will be sent automatically. After your physician reviews your transmission, you will receive a postcard with your next transmission date.   Dr Sallyanne Kuster recommends that you schedule a follow-up appointment in 6 months with a defibrillator check. You will receive a reminder letter in the mail two months in advance. If you don't receive a letter, please call our office to schedule the follow-up appointment.  If you need a refill on your cardiac medications before your next appointment, please call your pharmacy.    Signed, [name], MD  11/07/2015 5:16 PM    [address], [place],   [address] Phone: [phone]; Fax: [phone]

## 2015-11-13 ENCOUNTER — Telehealth: Payer: Self-pay | Admitting: Pharmacist Clinician (PhC)/ Clinical Pharmacy Specialist

## 2015-11-13 NOTE — Telephone Encounter (Signed)
LMOM, Dr. Loletha Grayer would like to have her see lipid clinic.  If agreeable will need labs before appt, last draw in December

## 2015-11-19 NOTE — Telephone Encounter (Signed)
Spoke with patient.  She states she takes 22 tablets per day and lives on limited income.  Cannot afford the medications she already has and feels somewhat overmedicated.  I explained the option of CLEAR trial (as there is no cost), but patient not interested.  She thanked me for being concerned and wanting to help her, but does not have it in her to take any more medications at this time.    Offered information on LIS, but she states that she has tried and does not qualify.

## 2015-11-30 LAB — CUP PACEART INCLINIC DEVICE CHECK
Brady Statistic AP VP Percent: 0.31 %
Brady Statistic AP VS Percent: 0.03 %
Brady Statistic AS VS Percent: 0.38 %
Brady Statistic RV Percent Paced: 99.6 %
Date Time Interrogation Session: 20170524200751
HIGH POWER IMPEDANCE MEASURED VALUE: 969 Ohm
HighPow Impedance: 63 Ohm
HighPow Impedance: 75 Ohm
Implantable Lead Implant Date: 20091202
Implantable Lead Location: 753859
Implantable Lead Model: 4194
Implantable Lead Model: 4470
Implantable Lead Serial Number: 635533
Lead Channel Impedance Value: 380 Ohm
Lead Channel Impedance Value: 456 Ohm
Lead Channel Impedance Value: 665 Ohm
Lead Channel Impedance Value: 950 Ohm
Lead Channel Pacing Threshold Amplitude: 0.375 V
Lead Channel Pacing Threshold Pulse Width: 0.4 ms
Lead Channel Sensing Intrinsic Amplitude: 4.25 mV
Lead Channel Sensing Intrinsic Amplitude: 4.25 mV
Lead Channel Setting Pacing Amplitude: 1.5 V
Lead Channel Setting Pacing Amplitude: 2.5 V
Lead Channel Setting Pacing Pulse Width: 0.4 ms
Lead Channel Setting Pacing Pulse Width: 1 ms
MDC IDC LEAD IMPLANT DT: 20091202
MDC IDC LEAD IMPLANT DT: 20091202
MDC IDC LEAD LOCATION: 753858
MDC IDC LEAD LOCATION: 753860
MDC IDC LEAD MODEL: 185
MDC IDC LEAD SERIAL: 308680
MDC IDC MSMT BATTERY VOLTAGE: 2.94 V
MDC IDC MSMT LEADCHNL LV PACING THRESHOLD AMPLITUDE: 1.5 V
MDC IDC MSMT LEADCHNL LV PACING THRESHOLD PULSEWIDTH: 1 ms
MDC IDC MSMT LEADCHNL RA IMPEDANCE VALUE: 494 Ohm
MDC IDC MSMT LEADCHNL RV PACING THRESHOLD AMPLITUDE: 0.5 V
MDC IDC MSMT LEADCHNL RV PACING THRESHOLD PULSEWIDTH: 0.4 ms
MDC IDC MSMT LEADCHNL RV SENSING INTR AMPL: 21.75 mV
MDC IDC MSMT LEADCHNL RV SENSING INTR AMPL: 21.75 mV
MDC IDC SET LEADCHNL RV PACING AMPLITUDE: 2 V
MDC IDC SET LEADCHNL RV SENSING SENSITIVITY: 0.3 mV
MDC IDC STAT BRADY AS VP PERCENT: 99.29 %
MDC IDC STAT BRADY RA PERCENT PACED: 0.34 %

## 2015-12-10 ENCOUNTER — Other Ambulatory Visit: Payer: Self-pay | Admitting: Family Medicine

## 2015-12-10 NOTE — Telephone Encounter (Signed)
Call in #120 with 5 rf 

## 2015-12-11 ENCOUNTER — Other Ambulatory Visit: Payer: Self-pay | Admitting: Family Medicine

## 2015-12-14 ENCOUNTER — Ambulatory Visit (INDEPENDENT_AMBULATORY_CARE_PROVIDER_SITE_OTHER): Payer: Medicare Other | Admitting: Family Medicine

## 2015-12-14 ENCOUNTER — Encounter: Payer: Self-pay | Admitting: Family Medicine

## 2015-12-14 VITALS — BP 126/70 | HR 97 | Temp 98.3°F | Ht 65.0 in | Wt 188.0 lb

## 2015-12-14 DIAGNOSIS — M25572 Pain in left ankle and joints of left foot: Secondary | ICD-10-CM | POA: Diagnosis not present

## 2015-12-14 DIAGNOSIS — M79672 Pain in left foot: Secondary | ICD-10-CM

## 2015-12-14 MED ORDER — HYDROCODONE-ACETAMINOPHEN 5-325 MG PO TABS: 1.0000 | ORAL_TABLET | Freq: Four times a day (QID) | ORAL | Status: DC | PRN

## 2015-12-14 NOTE — Progress Notes (Signed)
Pre visit review using our clinic review tool, if applicable. No additional management support is needed unless otherwise documented below in the visit note. 

## 2015-12-17 ENCOUNTER — Ambulatory Visit (INDEPENDENT_AMBULATORY_CARE_PROVIDER_SITE_OTHER)
Admission: RE | Admit: 2015-12-17 | Discharge: 2015-12-17 | Disposition: A | Discharge status: Home / Self Care | Payer: Medicare Other | Source: Ambulatory Visit | Attending: Family Medicine | Admitting: Family Medicine

## 2015-12-17 ENCOUNTER — Encounter: Payer: Self-pay | Admitting: Family Medicine

## 2015-12-17 DIAGNOSIS — M79672 Pain in left foot: Secondary | ICD-10-CM | POA: Diagnosis not present

## 2015-12-17 DIAGNOSIS — M7989 Other specified soft tissue disorders: Secondary | ICD-10-CM | POA: Diagnosis not present

## 2015-12-17 DIAGNOSIS — M25572 Pain in left ankle and joints of left foot: Secondary | ICD-10-CM | POA: Diagnosis not present

## 2015-12-17 NOTE — Progress Notes (Signed)
   Subjective:    Patient ID: Sydney Phillips, female    DOB: 1935-12-29, 80 y.o.   MRN: [id number]  HPI Here for injuries to the left foot and ankle when she feel at home yesterday. While trying to dig a plant out of the ground she fell backwards and twisted the foot. The lateral foot did swell and she applied ice last night. Taking Norco for pain. No other injuries.    Review of Systems  Constitutional: Negative.   Musculoskeletal: Positive for arthralgias and gait problem.       Objective:   Physical Exam  Constitutional: She is oriented to person, place, and time. She appears well-developed and well-nourished.  Limping slightly   Musculoskeletal:  The left dorsal and lateral foot is mildly swollen, not ecchymotic. Tender over the dorsal and lateral foot without crepitus. Tender over the lateral ankle. Full ROM  Neurological: She is alert and oriented to person, place, and time.          Assessment & Plan:  Injuries to the left foot and ankle. Given an ankle lace up brace for support. We will send her for Xrays of the foot and ankle today.

## 2016-01-18 ENCOUNTER — Telehealth: Payer: Self-pay | Admitting: Cardiovascular Disease

## 2016-01-18 NOTE — Telephone Encounter (Signed)
Called pt back and informed her that we did receive her transmission and that her next scheduled remote is 02/11/2016. Pt voiced understanding.

## 2016-01-18 NOTE — Telephone Encounter (Signed)
New message       1. Has your device fired? no  2. Is you device beeping? no  3. Are you experiencing draining or swelling at device site? no  4. Are you calling to see if we received your device transmission? The pt was wanting to know if her last down load went through  5. Have you passed out? no

## 2016-02-01 ENCOUNTER — Other Ambulatory Visit: Payer: Self-pay | Admitting: Family Medicine

## 2016-02-01 ENCOUNTER — Telehealth: Payer: Self-pay | Admitting: Family Medicine

## 2016-02-01 NOTE — Telephone Encounter (Signed)
Pt states she needs to see you for hip pain. Pt cannot get her until 4pm

## 2016-02-05 NOTE — Telephone Encounter (Signed)
Pt scheduled  

## 2016-02-06 ENCOUNTER — Encounter: Payer: Self-pay | Admitting: Family Medicine

## 2016-02-06 ENCOUNTER — Ambulatory Visit (INDEPENDENT_AMBULATORY_CARE_PROVIDER_SITE_OTHER): Payer: Medicare Other | Admitting: Family Medicine

## 2016-02-06 VITALS — BP 128/70 | HR 87 | Temp 98.7°F | Ht 65.0 in | Wt 185.0 lb

## 2016-02-06 DIAGNOSIS — M5416 Radiculopathy, lumbar region: Secondary | ICD-10-CM | POA: Diagnosis not present

## 2016-02-06 DIAGNOSIS — IMO0001 Reserved for inherently not codable concepts without codable children: Secondary | ICD-10-CM

## 2016-02-06 DIAGNOSIS — M25551 Pain in right hip: Secondary | ICD-10-CM | POA: Diagnosis not present

## 2016-02-06 NOTE — Progress Notes (Signed)
   Subjective:    Patient ID: Sydney Phillips, female    DOB: February 16, 1936, 80 y.o.   MRN: [id number]  HPI Here for several weeks of increased sharp pains in the right lower back and right hip. No recent trauma. Using Norco for pain even though it causes itching. The right leg feels weak at times but does not feel numb. She is S/P two lumbar disc surgeries.    Review of Systems  Musculoskeletal: Positive for arthralgias, back pain and gait problem.  Neurological: Positive for weakness. Negative for numbness.       Objective:   Physical Exam  Constitutional:  Walks with a walker, in mild pain  Musculoskeletal:  Tender over the right lower back and over the right sciatic notch. Negative SLR. The left hip has full ROM with no pain. The right hip has full ROM but there is pain on full internal and external rotation          Assessment & Plan:  It is not clear if her pain is sciatic in nature or if it is from the hip joint. We will get Xrays of the LS spine and both hips. She can use Norco for pain, but I suggested she take a Claritin daily to help with itching.  Laurey Morale, MD

## 2016-02-06 NOTE — Progress Notes (Signed)
Pre visit review using our clinic review tool, if applicable. No additional management support is needed unless otherwise documented below in the visit note. 

## 2016-02-08 DIAGNOSIS — H40033 Anatomical narrow angle, bilateral: Secondary | ICD-10-CM | POA: Diagnosis not present

## 2016-02-08 DIAGNOSIS — H2513 Age-related nuclear cataract, bilateral: Secondary | ICD-10-CM | POA: Diagnosis not present

## 2016-02-11 ENCOUNTER — Ambulatory Visit (INDEPENDENT_AMBULATORY_CARE_PROVIDER_SITE_OTHER): Payer: Medicare Other | Admitting: *Deleted

## 2016-02-11 ENCOUNTER — Ambulatory Visit (INDEPENDENT_AMBULATORY_CARE_PROVIDER_SITE_OTHER)
Admission: RE | Admit: 2016-02-11 | Discharge: 2016-02-11 | Disposition: A | Discharge status: Home / Self Care | Payer: Medicare Other | Source: Ambulatory Visit | Attending: Family Medicine | Admitting: Family Medicine

## 2016-02-11 DIAGNOSIS — M545 Low back pain: Secondary | ICD-10-CM | POA: Diagnosis not present

## 2016-02-11 DIAGNOSIS — IMO0001 Reserved for inherently not codable concepts without codable children: Secondary | ICD-10-CM

## 2016-02-11 DIAGNOSIS — M25551 Pain in right hip: Secondary | ICD-10-CM | POA: Diagnosis not present

## 2016-02-11 DIAGNOSIS — M5416 Radiculopathy, lumbar region: Secondary | ICD-10-CM | POA: Diagnosis not present

## 2016-02-11 DIAGNOSIS — Z95810 Presence of automatic (implantable) cardiac defibrillator: Secondary | ICD-10-CM

## 2016-02-11 DIAGNOSIS — M5136 Other intervertebral disc degeneration, lumbar region: Secondary | ICD-10-CM | POA: Diagnosis not present

## 2016-02-11 DIAGNOSIS — I429 Cardiomyopathy, unspecified: Secondary | ICD-10-CM

## 2016-02-12 NOTE — Progress Notes (Signed)
Remote ICD transmission.   

## 2016-02-13 LAB — CUP PACEART REMOTE DEVICE CHECK
Battery Voltage: 2.89 V
Brady Statistic AP VP Percent: 0.48 %
Brady Statistic RA Percent Paced: 0.52 %
Date Time Interrogation Session: 20170828213710
HIGH POWER IMPEDANCE MEASURED VALUE: 59 Ohm
HighPow Impedance: 69 Ohm
HighPow Impedance: 950 Ohm
Implantable Lead Implant Date: 20091202
Implantable Lead Implant Date: 20091202
Implantable Lead Location: 753859
Implantable Lead Model: 185
Implantable Lead Model: 4194
Implantable Lead Model: 4470
Implantable Lead Serial Number: 308680
Lead Channel Pacing Threshold Amplitude: 1.25 V
Lead Channel Pacing Threshold Pulse Width: 0.4 ms
Lead Channel Pacing Threshold Pulse Width: 0.4 ms
Lead Channel Pacing Threshold Pulse Width: 1 ms
Lead Channel Sensing Intrinsic Amplitude: 24.375 mV
Lead Channel Setting Pacing Amplitude: 1.5 V
Lead Channel Setting Pacing Amplitude: 2.5 V
Lead Channel Setting Pacing Pulse Width: 1 ms
MDC IDC LEAD IMPLANT DT: 20091202
MDC IDC LEAD LOCATION: 753858
MDC IDC LEAD LOCATION: 753860
MDC IDC LEAD SERIAL: 635533
MDC IDC MSMT LEADCHNL LV IMPEDANCE VALUE: 399 Ohm
MDC IDC MSMT LEADCHNL LV IMPEDANCE VALUE: 456 Ohm
MDC IDC MSMT LEADCHNL LV IMPEDANCE VALUE: 665 Ohm
MDC IDC MSMT LEADCHNL RA IMPEDANCE VALUE: 456 Ohm
MDC IDC MSMT LEADCHNL RA PACING THRESHOLD AMPLITUDE: 0.5 V
MDC IDC MSMT LEADCHNL RA SENSING INTR AMPL: 4.625 mV
MDC IDC MSMT LEADCHNL RA SENSING INTR AMPL: 4.625 mV
MDC IDC MSMT LEADCHNL RV IMPEDANCE VALUE: 912 Ohm
MDC IDC MSMT LEADCHNL RV PACING THRESHOLD AMPLITUDE: 0.625 V
MDC IDC MSMT LEADCHNL RV SENSING INTR AMPL: 24.375 mV
MDC IDC SET LEADCHNL RV PACING AMPLITUDE: 2 V
MDC IDC SET LEADCHNL RV PACING PULSEWIDTH: 0.4 ms
MDC IDC SET LEADCHNL RV SENSING SENSITIVITY: 0.3 mV
MDC IDC STAT BRADY AP VS PERCENT: 0.04 %
MDC IDC STAT BRADY AS VP PERCENT: 99.18 %
MDC IDC STAT BRADY AS VS PERCENT: 0.3 %
MDC IDC STAT BRADY RV PERCENT PACED: 99.66 %

## 2016-02-14 ENCOUNTER — Encounter: Payer: Self-pay | Admitting: Cardiology

## 2016-02-14 ENCOUNTER — Telehealth: Payer: Self-pay | Admitting: Family Medicine

## 2016-02-14 MED ORDER — HYDROCODONE-ACETAMINOPHEN 5-325 MG PO TABS: 1.0000 | ORAL_TABLET | Freq: Four times a day (QID) | ORAL | 0 refills | Status: DC | PRN

## 2016-02-14 NOTE — Telephone Encounter (Signed)
Script is ready for pick up here at front office and I spoke with pt.  

## 2016-02-14 NOTE — Telephone Encounter (Signed)
done

## 2016-02-19 ENCOUNTER — Other Ambulatory Visit: Payer: Self-pay | Admitting: Family Medicine

## 2016-02-20 DIAGNOSIS — E119 Type 2 diabetes mellitus without complications: Secondary | ICD-10-CM | POA: Diagnosis not present

## 2016-02-20 DIAGNOSIS — H25811 Combined forms of age-related cataract, right eye: Secondary | ICD-10-CM | POA: Diagnosis not present

## 2016-02-20 DIAGNOSIS — H25812 Combined forms of age-related cataract, left eye: Secondary | ICD-10-CM | POA: Diagnosis not present

## 2016-02-26 ENCOUNTER — Observation Stay (HOSPITAL_COMMUNITY)
Admission: EM | Admit: 2016-02-26 | Discharge: 2016-02-29 | Disposition: A | Discharge status: Home / Self Care | Payer: Medicare Other | Attending: Internal Medicine | Admitting: Internal Medicine

## 2016-02-26 ENCOUNTER — Emergency Department (HOSPITAL_COMMUNITY): Payer: Medicare Other

## 2016-02-26 ENCOUNTER — Encounter (HOSPITAL_COMMUNITY): Payer: Self-pay | Admitting: Emergency Medicine

## 2016-02-26 DIAGNOSIS — E785 Hyperlipidemia, unspecified: Secondary | ICD-10-CM | POA: Diagnosis not present

## 2016-02-26 DIAGNOSIS — R4182 Altered mental status, unspecified: Secondary | ICD-10-CM | POA: Diagnosis present

## 2016-02-26 DIAGNOSIS — I739 Peripheral vascular disease, unspecified: Secondary | ICD-10-CM | POA: Diagnosis present

## 2016-02-26 DIAGNOSIS — G8929 Other chronic pain: Secondary | ICD-10-CM | POA: Diagnosis not present

## 2016-02-26 DIAGNOSIS — N289 Disorder of kidney and ureter, unspecified: Secondary | ICD-10-CM | POA: Diagnosis not present

## 2016-02-26 DIAGNOSIS — M25559 Pain in unspecified hip: Secondary | ICD-10-CM | POA: Diagnosis present

## 2016-02-26 DIAGNOSIS — K219 Gastro-esophageal reflux disease without esophagitis: Secondary | ICD-10-CM | POA: Diagnosis not present

## 2016-02-26 DIAGNOSIS — R55 Syncope and collapse: Secondary | ICD-10-CM | POA: Diagnosis not present

## 2016-02-26 DIAGNOSIS — L989 Disorder of the skin and subcutaneous tissue, unspecified: Secondary | ICD-10-CM

## 2016-02-26 DIAGNOSIS — I2510 Atherosclerotic heart disease of native coronary artery without angina pectoris: Secondary | ICD-10-CM | POA: Diagnosis not present

## 2016-02-26 DIAGNOSIS — Z66 Do not resuscitate: Secondary | ICD-10-CM | POA: Insufficient documentation

## 2016-02-26 DIAGNOSIS — Z7984 Long term (current) use of oral hypoglycemic drugs: Secondary | ICD-10-CM | POA: Diagnosis not present

## 2016-02-26 DIAGNOSIS — K5909 Other constipation: Secondary | ICD-10-CM | POA: Insufficient documentation

## 2016-02-26 DIAGNOSIS — Z955 Presence of coronary angioplasty implant and graft: Secondary | ICD-10-CM | POA: Insufficient documentation

## 2016-02-26 DIAGNOSIS — Z7901 Long term (current) use of anticoagulants: Secondary | ICD-10-CM | POA: Insufficient documentation

## 2016-02-26 DIAGNOSIS — F17210 Nicotine dependence, cigarettes, uncomplicated: Secondary | ICD-10-CM | POA: Diagnosis not present

## 2016-02-26 DIAGNOSIS — I110 Hypertensive heart disease with heart failure: Secondary | ICD-10-CM | POA: Diagnosis not present

## 2016-02-26 DIAGNOSIS — N179 Acute kidney failure, unspecified: Principal | ICD-10-CM | POA: Insufficient documentation

## 2016-02-26 DIAGNOSIS — M1990 Unspecified osteoarthritis, unspecified site: Secondary | ICD-10-CM | POA: Insufficient documentation

## 2016-02-26 DIAGNOSIS — Z7902 Long term (current) use of antithrombotics/antiplatelets: Secondary | ICD-10-CM | POA: Diagnosis not present

## 2016-02-26 DIAGNOSIS — I509 Heart failure, unspecified: Secondary | ICD-10-CM | POA: Insufficient documentation

## 2016-02-26 DIAGNOSIS — Z8249 Family history of ischemic heart disease and other diseases of the circulatory system: Secondary | ICD-10-CM | POA: Insufficient documentation

## 2016-02-26 DIAGNOSIS — E875 Hyperkalemia: Secondary | ICD-10-CM | POA: Insufficient documentation

## 2016-02-26 DIAGNOSIS — Z79899 Other long term (current) drug therapy: Secondary | ICD-10-CM | POA: Insufficient documentation

## 2016-02-26 DIAGNOSIS — M797 Fibromyalgia: Secondary | ICD-10-CM | POA: Insufficient documentation

## 2016-02-26 DIAGNOSIS — Z950 Presence of cardiac pacemaker: Secondary | ICD-10-CM | POA: Diagnosis not present

## 2016-02-26 DIAGNOSIS — E119 Type 2 diabetes mellitus without complications: Secondary | ICD-10-CM | POA: Insufficient documentation

## 2016-02-26 DIAGNOSIS — I6522 Occlusion and stenosis of left carotid artery: Secondary | ICD-10-CM | POA: Diagnosis present

## 2016-02-26 DIAGNOSIS — I10 Essential (primary) hypertension: Secondary | ICD-10-CM | POA: Diagnosis present

## 2016-02-26 DIAGNOSIS — Z880 Allergy status to penicillin: Secondary | ICD-10-CM | POA: Insufficient documentation

## 2016-02-26 DIAGNOSIS — J449 Chronic obstructive pulmonary disease, unspecified: Secondary | ICD-10-CM | POA: Insufficient documentation

## 2016-02-26 DIAGNOSIS — I252 Old myocardial infarction: Secondary | ICD-10-CM | POA: Insufficient documentation

## 2016-02-26 DIAGNOSIS — R296 Repeated falls: Secondary | ICD-10-CM | POA: Diagnosis not present

## 2016-02-26 DIAGNOSIS — I255 Ischemic cardiomyopathy: Secondary | ICD-10-CM | POA: Insufficient documentation

## 2016-02-26 DIAGNOSIS — Z95810 Presence of automatic (implantable) cardiac defibrillator: Secondary | ICD-10-CM | POA: Diagnosis not present

## 2016-02-26 DIAGNOSIS — Z95820 Peripheral vascular angioplasty status with implants and grafts: Secondary | ICD-10-CM

## 2016-02-26 LAB — URINALYSIS, ROUTINE W REFLEX MICROSCOPIC
BILIRUBIN URINE: NEGATIVE
GLUCOSE, UA: NEGATIVE mg/dL
HGB URINE DIPSTICK: NEGATIVE
KETONES UR: NEGATIVE mg/dL
Leukocytes, UA: NEGATIVE
Nitrite: NEGATIVE
PH: 5.5 (ref 5.0–8.0)
PROTEIN: NEGATIVE mg/dL
Specific Gravity, Urine: 1.01 (ref 1.005–1.030)

## 2016-02-26 LAB — CBC
HEMATOCRIT: 39.4 % (ref 36.0–46.0)
HEMOGLOBIN: 13.1 g/dL (ref 12.0–15.0)
MCH: 32 pg (ref 26.0–34.0)
MCHC: 33.2 g/dL (ref 30.0–36.0)
MCV: 96.1 fL (ref 78.0–100.0)
Platelets: 264 10*3/uL (ref 150–400)
RBC: 4.1 MIL/uL (ref 3.87–5.11)
RDW: 16 % — AB (ref 11.5–15.5)
WBC: 8 10*3/uL (ref 4.0–10.5)

## 2016-02-26 LAB — I-STAT CHEM 8, ED
BUN: 55 mg/dL — AB (ref 6–20)
CALCIUM ION: 1.12 mmol/L — AB (ref 1.15–1.40)
CREATININE: 2.8 mg/dL — AB (ref 0.44–1.00)
Chloride: 100 mmol/L — ABNORMAL LOW (ref 101–111)
GLUCOSE: 218 mg/dL — AB (ref 65–99)
HEMATOCRIT: 42 % (ref 36.0–46.0)
HEMOGLOBIN: 14.3 g/dL (ref 12.0–15.0)
Potassium: 4.4 mmol/L (ref 3.5–5.1)
Sodium: 136 mmol/L (ref 135–145)
TCO2: 25 mmol/L (ref 0–100)

## 2016-02-26 LAB — DIFFERENTIAL
BASOS PCT: 0 %
Basophils Absolute: 0 10*3/uL (ref 0.0–0.1)
EOS ABS: 0.3 10*3/uL (ref 0.0–0.7)
Eosinophils Relative: 4 %
LYMPHS ABS: 2 10*3/uL (ref 0.7–4.0)
Lymphocytes Relative: 26 %
MONO ABS: 0.5 10*3/uL (ref 0.1–1.0)
MONOS PCT: 7 %
NEUTROS ABS: 5.1 10*3/uL (ref 1.7–7.7)
Neutrophils Relative %: 63 %

## 2016-02-26 LAB — PROTIME-INR
INR: 0.95
Prothrombin Time: 12.6 seconds (ref 11.4–15.2)

## 2016-02-26 LAB — COMPREHENSIVE METABOLIC PANEL
ALT: 18 U/L (ref 14–54)
AST: 29 U/L (ref 15–41)
Albumin: 4.1 g/dL (ref 3.5–5.0)
Alkaline Phosphatase: 63 U/L (ref 38–126)
Anion gap: 11 (ref 5–15)
BILIRUBIN TOTAL: 0.4 mg/dL (ref 0.3–1.2)
BUN: 58 mg/dL — AB (ref 6–20)
CHLORIDE: 101 mmol/L (ref 101–111)
CO2: 23 mmol/L (ref 22–32)
CREATININE: 2.79 mg/dL — AB (ref 0.44–1.00)
Calcium: 9.6 mg/dL (ref 8.9–10.3)
GFR, EST AFRICAN AMERICAN: 18 mL/min — AB (ref >60)
GFR, EST NON AFRICAN AMERICAN: 15 mL/min — AB (ref >60)
Glucose, Bld: 228 mg/dL — ABNORMAL HIGH (ref 65–99)
Potassium: 4.6 mmol/L (ref 3.5–5.1)
Sodium: 135 mmol/L (ref 135–145)
TOTAL PROTEIN: 7 g/dL (ref 6.5–8.1)

## 2016-02-26 LAB — GLUCOSE, CAPILLARY
GLUCOSE-CAPILLARY: 126 mg/dL — AB (ref 65–99)
Glucose-Capillary: 180 mg/dL — ABNORMAL HIGH (ref 65–99)

## 2016-02-26 LAB — I-STAT TROPONIN, ED: TROPONIN I, POC: 0.04 ng/mL (ref 0.00–0.08)

## 2016-02-26 LAB — APTT: aPTT: 33 seconds (ref 24–36)

## 2016-02-26 LAB — MAGNESIUM: MAGNESIUM: 2.7 mg/dL — AB (ref 1.7–2.4)

## 2016-02-26 LAB — CBG MONITORING, ED: Glucose-Capillary: 242 mg/dL — ABNORMAL HIGH (ref 65–99)

## 2016-02-26 MED ORDER — DULOXETINE HCL 60 MG PO CPEP
60.0000 mg | ORAL_CAPSULE | Freq: Two times a day (BID) | ORAL | Status: DC
  Administered 2016-02-26 – 2016-02-29 (×6): 60 mg via ORAL
  Filled 2016-02-26 (×6): qty 1

## 2016-02-26 MED ORDER — ALPRAZOLAM 0.5 MG PO TABS
1.0000 mg | ORAL_TABLET | Freq: Four times a day (QID) | ORAL | Status: DC | PRN
Start: 2016-02-26 — End: 2016-02-29
  Administered 2016-02-26 – 2016-02-28 (×2): 1 mg via ORAL
  Filled 2016-02-26 (×2): qty 2

## 2016-02-26 MED ORDER — SODIUM CHLORIDE 0.9 % IV SOLN
INTRAVENOUS | Status: DC
  Administered 2016-02-26: 1000 mL via INTRAVENOUS
  Administered 2016-02-27 – 2016-02-29 (×3): via INTRAVENOUS

## 2016-02-26 MED ORDER — ACETAMINOPHEN 650 MG RE SUPP: 650.0000 mg | Freq: Four times a day (QID) | RECTAL | Status: DC | PRN

## 2016-02-26 MED ORDER — METOPROLOL TARTRATE 25 MG PO TABS
25.0000 mg | ORAL_TABLET | Freq: Every day | ORAL | Status: DC
  Administered 2016-02-26 – 2016-02-29 (×4): 25 mg via ORAL
  Filled 2016-02-26 (×4): qty 1

## 2016-02-26 MED ORDER — ENOXAPARIN SODIUM 30 MG/0.3ML ~~LOC~~ SOLN
30.0000 mg | SUBCUTANEOUS | Status: DC
  Administered 2016-02-26 – 2016-02-28 (×3): 30 mg via SUBCUTANEOUS
  Filled 2016-02-26 (×3): qty 0.3

## 2016-02-26 MED ORDER — INSULIN ASPART 100 UNIT/ML ~~LOC~~ SOLN
0.0000 [IU] | Freq: Three times a day (TID) | SUBCUTANEOUS | Status: DC
  Administered 2016-02-26 – 2016-02-27 (×2): 1 [IU] via SUBCUTANEOUS
  Administered 2016-02-27 – 2016-02-28 (×2): 2 [IU] via SUBCUTANEOUS
  Administered 2016-02-29: 1 [IU] via SUBCUTANEOUS

## 2016-02-26 MED ORDER — ALLOPURINOL 100 MG PO TABS
300.0000 mg | ORAL_TABLET | Freq: Every day | ORAL | Status: DC
  Administered 2016-02-26 – 2016-02-29 (×4): 300 mg via ORAL
  Filled 2016-02-26 (×4): qty 3

## 2016-02-26 MED ORDER — FLUTICASONE PROPIONATE 50 MCG/ACT NA SUSP
1.0000 | Freq: Every day | NASAL | Status: DC
  Administered 2016-02-29: 1 via NASAL
  Filled 2016-02-26: qty 16

## 2016-02-26 MED ORDER — ACETAMINOPHEN 325 MG PO TABS: 650.0000 mg | ORAL_TABLET | Freq: Four times a day (QID) | ORAL | Status: DC | PRN

## 2016-02-26 MED ORDER — SODIUM CHLORIDE 0.9% FLUSH
3.0000 mL | Freq: Two times a day (BID) | INTRAVENOUS | Status: DC
  Administered 2016-02-26 – 2016-02-29 (×3): 3 mL via INTRAVENOUS

## 2016-02-26 MED ORDER — KETOTIFEN FUMARATE 0.025 % OP SOLN
1.0000 [drp] | Freq: Two times a day (BID) | OPHTHALMIC | Status: DC
  Administered 2016-02-26: 1 [drp] via OPHTHALMIC
  Filled 2016-02-26 (×2): qty 5

## 2016-02-26 MED ORDER — INSULIN ASPART 100 UNIT/ML ~~LOC~~ SOLN
0.0000 [IU] | Freq: Every day | SUBCUTANEOUS | Status: DC
  Administered 2016-02-27: 2 [IU] via SUBCUTANEOUS

## 2016-02-26 MED ORDER — CLOPIDOGREL BISULFATE 75 MG PO TABS
75.0000 mg | ORAL_TABLET | Freq: Every day | ORAL | Status: DC
  Administered 2016-02-26 – 2016-02-29 (×4): 75 mg via ORAL
  Filled 2016-02-26 (×4): qty 1

## 2016-02-26 MED ORDER — MAGNESIUM OXIDE 400 (241.3 MG) MG PO TABS
400.0000 mg | ORAL_TABLET | Freq: Two times a day (BID) | ORAL | Status: DC
  Administered 2016-02-26 – 2016-02-28 (×4): 400 mg via ORAL
  Filled 2016-02-26 (×5): qty 1

## 2016-02-26 MED ORDER — OXYCODONE HCL 5 MG PO TABS: 5.0000 mg | ORAL_TABLET | Freq: Four times a day (QID) | ORAL | Status: DC | PRN

## 2016-02-26 MED ORDER — ALBUTEROL SULFATE (2.5 MG/3ML) 0.083% IN NEBU: 3.0000 mL | INHALATION_SOLUTION | RESPIRATORY_TRACT | Status: DC | PRN

## 2016-02-26 MED ORDER — SODIUM CHLORIDE 0.9 % IV BOLUS (SEPSIS)
1000.0000 mL | Freq: Once | INTRAVENOUS | Status: AC
  Administered 2016-02-26: 1000 mL via INTRAVENOUS

## 2016-02-26 MED ORDER — INFLUENZA VAC SPLIT QUAD 0.5 ML IM SUSY
0.5000 mL | PREFILLED_SYRINGE | INTRAMUSCULAR | Status: AC
  Administered 2016-02-27: 0.5 mL via INTRAMUSCULAR
  Filled 2016-02-26: qty 0.5

## 2016-02-26 NOTE — H&P (Signed)
History and Physical    Sydney Phillips [id number] DOB: 1935/07/01 DOA: 02/26/2016   PCP: Laurey Morale, MD   Patient coming from/Resides with: Private residence/lives with son  Admission status: Observation/telemetry  Chief Complaint: Altered mental status and recurrent falls  HPI: Sydney Phillips is a 80 y.o. female with medical history significant for combination nonischemic ischemic cardiomyopathy status post CRT on chronic Lasix, COPD and ongoing tobacco abuse, diabetes, hypertension, peripheral vascular disease with known left carotid stenosis and prior left subclavian and plasty with stent, history of recurrent syncopal episodes previously felt to be related to subclavian steal syndrome, recent issues with right hip pain and dyslipidemia. Patient reports being in her usual state of health. At the end of all she was having issues with right-sided low back pain. She was prescribed Norco by her primary care physician but had a sensation of being squeezed around her arms and chest very tightly and was instructed to stop this medication. She has not had further recurrence of the symptoms since then. She was subsequently prescribed oxycodone with improvement in her pain. The son brought the patient to the hospital today she began having confusion yesterday and had fallen at least 3 times. She did not have any focal neurological deficits and has improved somewhat since arriving to the ER. Patient does not recall falling but she does remember waking up on the floor. She denied any dizziness with standing, any chest pain, any palpitations, any urinary tract symptoms such as dysuria or urinary frequency. No fevers no chills.  ED Course:  Vital Signs: BP 162/77 (BP Location: Right Arm)   Pulse 107   Temp 97.9 F (36.6 C) (Oral)   Resp 21   SpO2 97%  CT head without contrast: Atrophy and small vessel scheming changes without acute intracranial abnormality Lab data: Sodium 136, potassium  4.4, chloride 100, CO2 23, BUN 58, creatinine 2.79, calcium 9.6, glucose 228, anion gap 11, calcium 1.12, LFTs normal, poc troponin 0.04, white count 8000 on the differential, hemoglobin 13.1, platelets 264,000, coags normal. Medications and treatments: Normal saline bolus 1 L  Review of Systems:  In addition to the HPI above,  No Fever-chills, myalgias or other constitutional symptoms No Headache, changes with Vision or hearing, new weakness, tingling, numbness in any extremity No problems swallowing food or Liquids, indigestion/reflux No Chest pain, Cough or Shortness of Breath, palpitations, orthopnea or DOE No Abdominal pain, N/V; no melena or hematochezia, no dark tarry stools, Bowel movements are regular, No dysuria, hematuria or flank pain No new skin rashes, masses or bruises, No new joints pains-aches No recent weight gain or loss No polyuria, polydypsia or polyphagia,   Past Medical History:  Diagnosis Date  . Arthritis   . Automatic implantable cardioverter-defibrillator in situ   . Bronchitis   . CAD (coronary artery disease)    Currently angina free, no evidence of reversible ischemia  . Chronic back pain   . Chronic constipation   . Colon polyps 2003.  2015.   HP polyps 2003.  adnomas 2015.  required referal to [facility] for colonoscopic resection of flat polyps.   . Congestive heart failure (CHF) (HCC)    New York Heart Association functional class 2, diastolic dysfunction  . COPD (chronic obstructive pulmonary disease) ([place])   . Depression with anxiety    takes Cymbalta daily  . Diabetes mellitus without complication (HCC)    no meds  . Early cataracts, bilateral   . Fibromyalgia   . GERD (gastroesophageal  reflux disease)    was on meds but was taken off;now watches what she eats  . Hemorrhoids   . History of kidney stones   . Hx of colonic polyps   . Hyperlipemia    takes Crestor daily  . Hypertension    takes Amlodipine and Metoprolol daily  . Insomnia     . LBBB (left bundle branch block)    Stress test 09/03/2010, EF 55  . Myocardial infarction ([place])   . PAD (peripheral artery disease) (HCC)    Carotid, subclavian, and lower extremity beds, currently not symptomatic  . Presence of combination internal cardiac defibrillator (ICD) and pacemaker   . Presence of permanent cardiac pacemaker   . S/P angioplasty with stent, lt. subclavian 07/31/11 08/01/2011  . Shortness of breath    when over exerting self per pt.  . Subclavian arterial stenosis, lt, with PTA/STENT 07/31/11 08/01/2011  . Syncope 07/28/2011   EF - 50-55, moderate concentric hypertrophy in left ventricle  . Urinary incontinence   . Vertigo    takes Meclizine prn    Past Surgical History:  Procedure Laterality Date  . ABDOMINAL AORTAGRAM N/A 08/12/2013   Procedure: ABDOMINAL Maxcine Ham;  Surgeon: Elam Dutch, MD;  Location[facility] LAB;  Service: Cardiovascular;  Laterality: N/A;  . ABDOMINAL HYSTERECTOMY    . APPENDECTOMY    . BACK SURGERY  2012  . BIV ICD GENERTAOR CHANGE OUT Left 02/20/2012   Procedure: BIV ICD GENERTAOR CHANGE OUT;  Surgeon: [name], MD;  Location: [facility] CATH LAB;  Service: Cardiovascular;  Laterality: Left;  . CARDIAC CATHETERIZATION  12/01/2007   By Dr. Melvern Banker, left heart cath,   . CARDIAC DEFIBRILLATOR PLACEMENT  05/2008   By Dr Blanch Media, [employer] CANNOT HAVE MRI's  . CAROTID ANGIOGRAM N/A 07/31/2011   Procedure: CAROTID ANGIOGRAM;  Surgeon: Lorretta Harp, MD;  Location: [facility] CATH LAB;  Service: Cardiovascular;  Laterality: N/A;  carotid angiogram and possible Lt SCA PTA  . COLONOSCOPY W/ POLYPECTOMY  12/2013  . CORONARY ANGIOPLASTY    . ENDARTERECTOMY Left 11/08/2014   Procedure: LEFT CAROTID ENDARTERECTOMY WITH HEMASHIELD PATCH ANGIOPLASTY;  Surgeon: Elam Dutch, MD;  Location: [place];  Service: Vascular;  Laterality: Left;  . ESOPHAGOGASTRODUODENOSCOPY N/A 01/20/2014   Procedure: ESOPHAGOGASTRODUODENOSCOPY (EGD);  Surgeon: Jerene Bears, MD;   Location: [facility] ENDOSCOPY;  Service: Endoscopy;  Laterality: N/A;  . FEMORAL-POPLITEAL BYPASS GRAFT Right 10/12/2013   Procedure:   Femoral-Peroneal trunk  bypass with nonreversed greater saphenous vein graft;  Surgeon: Elam Dutch, MD;  Location: [place];  Service: Vascular;  Laterality: Right;  . GIVENS CAPSULE STUDY N/A 01/20/2014   Procedure: GIVENS CAPSULE STUDY;  Surgeon: Jerene Bears, MD;  Location: [place];  Service: Gastroenterology;  Laterality: N/A;  . INTRAOPERATIVE ARTERIOGRAM Right 10/12/2013   Procedure: INTRA OPERATIVE ARTERIOGRAM;  Surgeon: Elam Dutch, MD;  Location: [place];  Service: Vascular;  Laterality: Right;  . ORIF ELBOW FRACTURE  08/16/2011   Procedure: OPEN REDUCTION INTERNAL FIXATION (ORIF) ELBOW/OLECRANON FRACTURE;  Surgeon: Schuyler Amor, MD;  Location: [place];  Service: Orthopedics;  Laterality: Left;  . RENAL ANGIOGRAM N/A 08/12/2013   Procedure: RENAL ANGIOGRAM;  Surgeon: Elam Dutch, MD;  Location: [facility] CATH LAB;  Service: Cardiovascular;  Laterality: N/A;  . SUBCLAVIAN STENT PLACEMENT Left 07/31/2011   7x18 Genesis, balloon, with reduction of 90% ostial left subclavian artery stenosis to 0% with residual excellent flow  . TONSILLECTOMY    . TUBAL  LIGATION      Social History   Social History  . Marital status: Widowed    Spouse name: N/A  . Number of children: 3  . Years of education: N/A   Occupational History  . Not on file.   Social History Main Topics  . Smoking status: Current Every Day Smoker    Packs/day: 0.25    Years: 60.00    Types: Cigarettes  . Smokeless tobacco: Never Used     Comment: 1/2 pack per day  . Alcohol use No  . Drug use: No     Comment: 4-1/2 pk  . Sexual activity: No   Other Topics Concern  . Not on file   Social History Narrative   Ok to share information with medical POA, Son Gabriel Carina    Mobility: Kasandra Knudsen or rolling walker Work history: Retired   Allergies  Allergen Reactions  . Codeine Itching   . Darvon Itching  . Meloxicam Other (See Comments)    Unknown    . Norco [Hydrocodone-Acetaminophen]     itching  . Potassium-Containing Compounds Other (See Comments)    Causes severe constipation  . Propoxyphene N-Acetaminophen Itching  . Rofecoxib Other (See Comments)    Unknown    . Rosuvastatin Other (See Comments)    cramps  . Statins Itching and Other (See Comments)    Sleeplessness  . Azithromycin Rash  . Erythromycin Rash  . Penicillins Rash  . Sulfa Antibiotics Rash    Family History  Problem Relation Age of Onset  . CAD Father   . Heart disease Father   . Hyperlipidemia Father   . Heart disease Mother   . Deep vein thrombosis Son   . Hyperlipidemia    . Colon cancer Maternal Grandmother   . Cancer Sister     ovarian  . Diabetes Sister   . Heart disease Sister   . Anesthesia problems Neg Hx   . Hypotension Neg Hx   . Malignant hyperthermia Neg Hx   . Pseudochol deficiency Neg Hx      Prior to Admission medications   Medication Sig Start Date End Date Taking? Authorizing Provider  ACCU-CHEK FASTCLIX LANCETS MISC CHECK BLOOD SUGAR ONCE DAILY 07/24/15  Yes Laurey Morale, MD  ACCU-CHEK SMARTVIEW test strip CHECK BLOOD SUGAR ONCE DAILY 08/09/15  Yes Laurey Morale, MD  albuterol (PROVENTIL HFA;VENTOLIN HFA) 108 (90 Base) MCG/ACT inhaler Inhale 2 puffs into the lungs every 4 (four) hours as needed for wheezing or shortness of breath. 10/12/15  Yes Laurey Morale, MD  allopurinol (ZYLOPRIM) 300 MG tablet Take 1 tablet (300 mg total) by mouth daily. 10/15/15  Yes Laurey Morale, MD  ALPRAZolam Duanne Moron) 1 MG tablet TAKE ONE TABLET BY MOUTH EVERY 6 HOURS AS NEEDED FOR ANXIETY 12/11/15  Yes Laurey Morale, MD  clopidogrel (PLAVIX) 75 MG tablet TAKE ONE TABLET BY MOUTH ONCE DAILY 02/20/16  Yes Laurey Morale, MD  DULoxetine (CYMBALTA) 60 MG capsule TAKE ONE CAPSULE BY MOUTH TWICE DAILY 02/04/16  Yes Laurey Morale, MD  furosemide (LASIX) 80 MG tablet Take 1 tablet (80 mg total) by  mouth 2 (two) times daily. 10/29/15  Yes Laurey Morale, MD  HYDROcodone-acetaminophen (NORCO) 5-325 MG tablet Take 1 tablet by mouth every 6 (six) hours as needed for moderate pain. 02/14/16  Yes Laurey Morale, MD  ketotifen (ZADITOR) 0.025 % ophthalmic solution Place 1 drop into both eyes 2 (two) times daily.   Yes  Historical Provider, MD  lisinopril (PRINIVIL,ZESTRIL) 20 MG tablet Take 1 tablet (20 mg total) by mouth daily. 10/29/15  Yes Laurey Morale, MD  magnesium oxide (MAG-OX) 400 MG tablet Take 1 tablet (400 mg total) by mouth daily. Patient taking differently: Take 400 mg by mouth 2 (two) times daily.  06/27/15  Yes Laurey Morale, MD  metFORMIN (GLUCOPHAGE) 500 MG tablet Take 1 tablet (500 mg total) by mouth 2 (two) times daily with a meal. 05/30/15  Yes Laurey Morale, MD  metoprolol tartrate (LOPRESSOR) 25 MG tablet Take 1 tablet (25 mg total) by mouth daily. 05/30/15  Yes Laurey Morale, MD  Triamcinolone Acetonide (NASACORT AQ NA) Place into the nose daily. Over the counter   Yes Historical Provider, MD  clopidogrel (PLAVIX) 75 MG tablet Take 1 tablet (75 mg total) by mouth daily. 06/27/15   Laurey Morale, MD  nitroGLYCERIN (NITROSTAT) 0.4 MG SL tablet Place 1 tablet (0.4 mg total) under the tongue every 5 (five) minutes x 3 doses as needed for chest pain. Patient not taking: Reported on 12/14/2015 10/12/15   Laurey Morale, MD  potassium chloride SA (KLOR-CON M20) 20 MEQ tablet Take 1 tablet (20 mEq total) by mouth 3 (three) times daily. Patient not taking: Reported on 02/06/2016 06/01/15   Laurey Morale, MD    Physical Exam: Vitals:   02/26/16 1100 02/26/16 1130 02/26/16 1200 02/26/16 1255  BP: 114/63 106/56 127/63 162/77  Pulse: 94 91 88 107  Resp: 23 24 20 21   Temp:      TempSrc:      SpO2: 94% 91% 93% 97%      Constitutional: NAD, calm, comfortable Eyes: PERRL, lids and conjunctivae normal ENMT: Mucous membranes are dry. Posterior pharynx clear of any exudate or lesions.Normal  dentition.  Neck: normal, supple, no masses, no thyromegaly Respiratory: clear to auscultation bilaterally, no wheezing, no crackles. Normal respiratory effort. No accessory muscle use.  Cardiovascular: Regular rate and rhythm, no murmurs / rubs / gallops. No extremity edema. 2+ pedal pulses. No carotid bruits.  Abdomen: no tenderness, no masses palpated. No hepatosplenomegaly. Bowel sounds positive.  Musculoskeletal: no clubbing / cyanosis. No joint deformity upper and lower extremities. Good ROM, no contractures. Normal muscle tone.  Skin: no rashes or ulcers. No induration- dark <1cm diameter lesion upper right back/shoulder (see picture below) Neurologic: CN 2-12 grossly intact. Sensation intact, DTR normal. Strength 5/5 x all 4 extremities.  Psychiatric:  Alert and oriented x 3. Occasionally becomes confused about her medications and dosages but is able to correct herself, Normal mood.   Skin lesion right upper back/shoulder:    Labs on Admission: I have personally reviewed following labs and imaging studies  CBC:  Recent Labs Lab 02/26/16 0838 02/26/16 0857  WBC 8.0  --   NEUTROABS 5.1  --   HGB 13.1 14.3  HCT 39.4 42.0  MCV 96.1  --   PLT 264  --    Basic Metabolic Panel:  Recent Labs Lab 02/26/16 0838 02/26/16 0857  NA 135 136  K 4.6 4.4  CL 101 100*  CO2 23  --   GLUCOSE 228* 218*  BUN 58* 55*  CREATININE 2.79* 2.80*  CALCIUM 9.6  --    GFR: CrCl cannot be calculated (Unknown ideal weight.). Liver Function Tests:  Recent Labs Lab 02/26/16 0838  AST 29  ALT 18  ALKPHOS 63  BILITOT 0.4  PROT 7.0  ALBUMIN 4.1   No results for  input(s): LIPASE, AMYLASE in the last 168 hours. No results for input(s): AMMONIA in the last 168 hours. Coagulation Profile:  Recent Labs Lab 02/26/16 0838  INR 0.95   Cardiac Enzymes: No results for input(s): CKTOTAL, CKMB, CKMBINDEX, TROPONINI in the last 168 hours. BNP (last 3 results) No results for input(s):  PROBNP in the last 8760 hours. HbA1C: No results for input(s): HGBA1C in the last 72 hours. CBG:  Recent Labs Lab 02/26/16 0839  GLUCAP 242*   Lipid Profile: No results for input(s): CHOL, HDL, LDLCALC, TRIG, CHOLHDL, LDLDIRECT in the last 72 hours. Thyroid Function Tests: No results for input(s): TSH, T4TOTAL, FREET4, T3FREE, THYROIDAB in the last 72 hours. Anemia Panel: No results for input(s): VITAMINB12, FOLATE, FERRITIN, TIBC, IRON, RETICCTPCT in the last 72 hours. Urine analysis:    Component Value Date/Time   COLORURINE YELLOW 11/03/2014 [place] 11/03/2014 1442   LABSPEC 1.009 11/03/2014 1442   PHURINE 6.5 11/03/2014 1442   GLUCOSEU NEGATIVE 11/03/2014 1442   HGBUR NEGATIVE 11/03/2014 1442   HGBUR negative 01/01/2010 1353   BILIRUBINUR n 05/30/2015 1542   KETONESUR NEGATIVE 11/03/2014 1442   PROTEINUR n 05/30/2015 1542   PROTEINUR NEGATIVE 11/03/2014 1442   UROBILINOGEN 0.2 05/30/2015 1542   UROBILINOGEN 1.0 11/03/2014 1442   NITRITE n 05/30/2015 1542   NITRITE NEGATIVE 11/03/2014 1442   LEUKOCYTESUR Negative 05/30/2015 1542   Sepsis Labs: @LABRCNTIP (procalcitonin:4,lacticidven:4) )No results found for this or any previous visit (from the past 240 hour(s)).   Radiological Exams on Admission: Ct Head Wo Contrast  Result Date: 02/26/2016 CLINICAL DATA:  Numerous falls, the patient is on anticoagulation EXAM: CT HEAD WITHOUT CONTRAST TECHNIQUE: Contiguous axial images were obtained from the base of the skull through the vertex without intravenous contrast. COMPARISON:  CT brain scan of 11/30/2013 FINDINGS: Brain: The ventricular system remains prominent, as are the cortical sulci, indicative of diffuse atrophy. The septum is midline in position. Moderate small vessel ischemic change is noted throughout the periventricular white matter. No hemorrhage, mass lesion, or acute infarction is seen. Vascular: On this unenhanced study, no vascular abnormality  is noted. Skull: On bone window images, no calvarial abnormality is seen. Sinuses/Orbits: The paranasal sinuses are pneumatized. Other: None IMPRESSION: Atrophy and small vessel ischemic change. No acute intracranial abnormality. Electronically Signed   By: Ivar Drape M.D.   On: 02/26/2016 09:12    EKG: (Independently reviewed) 100% ventricular pacing with a rate of 92 bpm, QTC 493 ms,  Assessment/Plan Principal Problem:   Syncopal episodes/ Altered mental state -Patient presents with altered mentation and reported at least 3 syncopal episodes in the past 24 hours -Differential includes orthostatic hypotension/syncope secondary to overdiuresis versus arrhythmia -Patient appears clinically dry and meets criteria for acute kidney injury -Patient was not orthostatic in the ER but unfortunately orthostatic vital signs were obtained AFTER she was treated with a liter of fluids for her acute kidney injury -I have notified the card master with cardiology that her pacemaker needs interrogation -Echocardiogram -Altered mentation could also be secondary to potential infectious etiology so we'll check urinalysis and culture (currently asymptomatic) -No focal neurological deficits and CT of the head unremarkable so at this juncture do not suspect acute CVA -Neuro checks every 4 hours  Active Problems:   PVD, known severe, (previuosly asymptomatic) LSCA disease, now with "high grade" RICA disease/S/P angioplasty with stent, lt. subclavian 07/31/11/Left carotid stenosis -As above no focal neurological deficits although with bilateral carotid disease in setting of dehydration  dehydration could result in a syncopal episode     Acute kidney injury  -Renal function December 2016: 14/1.5 -Renal function: 58/2.79 -Hold Lasix, ACE inhibitor and metformin -Given 1 L fluids in the ER and will proceed which and IV fluid hydration normal saline at 50 mL an hour -Follow electrolytes    Essential  hypertension -Current blood pressure controlled -Lisinopril on hold as above -Continue Lopressor    COPD (chronic obstructive pulmonary disease) with emphysema  -Currently asymptomatic without wheezing -Continue preadmission albuterol MDI -Counseled regarding tobacco cessation    CAD -Currently asymptomatic -Continue Plavix    Biventricular implantable cardioverter-defibrillator in situ for CRT/combination of ischemic and nonischemic cardiomyopathy -At presentation appears dehydrated/volume depleted -Review of outpatient cardiology documentation reveals that in April patient had thoracic impedance readings concerning for volume overload but later readings more consistent with euvolemia -Patient reports at home has highly inconsistent weights varying between 179 pounds and 186 pounds; she reports episodes of unexpected high output urine without adjusting her diuretics -Given current presentation and pending echocardiogram findings patient's Lasix dosage may need to be adjusted prior to discharge -Patient admits to not taking her potassium correctly due to difficulty swallowing tablets-current potassium 4.6-I have asked her son to bring in the potassium she has at home to make a determination if what she has can be crushed or if she has capsules that these can be opened up and poured onto applesauce and pudding; history of hypomagnesemia as well and recent leg cramps so we'll check magnesium level and continue home magnesium    Diabetes mellitus without complication  -Current CBGs greater than 200 -Check hemoglobin A1c -Holding hold metformin in setting of acute kidney injury -Follow CBGs and provide SSI      HLD (hyperlipidemia) -Not on statin per her to admission    HIP PAIN, BILATERAL -More recent issues with right-sided pain that has improved with the addition of oxycodone in the outpatient setting -Lumbar spine films obtained in August showed no evidence of acute compression  fracture    Skin lesion of back/upper right shoulder -Discussed with patient and son that they need to have primary care physician looking lesion make a determination as to whether further evaluation with dermatologist indicated    Anxiety disorder -Continue preadmission Xanax and Cymbalta      DVT prophylaxis: Lovenox dose adjusted for acute kidney injury low GFR  Code Status: DO NOT RESUSCITATE Family Communication: Son at bedside  Disposition Plan: Anticipate discharge back to preadmission home and once medically stable and pending PT evaluation Consults called: Cardiology for pacemaker interrogation only    ELLIS,ALLISON L. ANP-BC [facility] Pager [phone]   If 7PM-7AM, please contact night-coverage [email or web address] Password [email or web address]  02/26/2016, 1:27 PM

## 2016-02-26 NOTE — ED Triage Notes (Signed)
Per pts son. Pt having multiple falls and some confusion starting yesterday. This morning he woke up and she was cooking "dinner." Pt is on bloodthinners and reportedly fell several times this morning as well. Pt has equal grips, alert and oriented at check in. No drift

## 2016-02-26 NOTE — ED Provider Notes (Signed)
[facility] DEPT Provider Note   CSN: [id number] Arrival date & time: 02/26/16  0808     History   Chief Complaint Chief Complaint  Patient presents with  . Stroke Symptoms    HPI Sydney Phillips is a 80 y.o. female.  Level V caveat for altered mental status. History obtained from patient and her son. Patient is normally independent with her activities of daily living. Son reports altered mental status for the last 2-3 days. No specific somatic complaints. Patient states she has fallen 3 times in the past 2 days which is not that unusual. No fever, sweats, chills, dysuria, chest pain, dyspnea.      Past Medical History:  Diagnosis Date  . Arthritis   . Automatic implantable cardioverter-defibrillator in situ   . Bronchitis   . CAD (coronary artery disease)    Currently angina free, no evidence of reversible ischemia  . Chronic back pain   . Chronic constipation   . Colon polyps 2003.  2015.   HP polyps 2003.  adnomas 2015.  required referal to [facility] for colonoscopic resection of flat polyps.   . Congestive heart failure (CHF) (HCC)    New York Heart Association functional class 2, diastolic dysfunction  . COPD (chronic obstructive pulmonary disease) ([place])   . Depression with anxiety    takes Cymbalta daily  . Diabetes mellitus without complication (HCC)    no meds  . Early cataracts, bilateral   . Fibromyalgia   . GERD (gastroesophageal reflux disease)    was on meds but was taken off;now watches what she eats  . Hemorrhoids   . History of kidney stones   . Hx of colonic polyps   . Hyperlipemia    takes Crestor daily  . Hypertension    takes Amlodipine and Metoprolol daily  . Insomnia   . LBBB (left bundle branch block)    Stress test 09/03/2010, EF 55  . Myocardial infarction ([place])   . PAD (peripheral artery disease) (HCC)    Carotid, subclavian, and lower extremity beds, currently not symptomatic  . Presence of combination internal cardiac  defibrillator (ICD) and pacemaker   . Presence of permanent cardiac pacemaker   . S/P angioplasty with stent, lt. subclavian 07/31/11 08/01/2011  . Shortness of breath    when over exerting self per pt.  . Subclavian arterial stenosis, lt, with PTA/STENT 07/31/11 08/01/2011  . Syncope 07/28/2011   EF - 50-55, moderate concentric hypertrophy in left ventricle  . Urinary incontinence   . Vertigo    takes Meclizine prn    Patient Active Problem List   Diagnosis Date Noted  . Altered mental state 02/26/2016  . Cardiomyopathy ([place]) 11/07/2015  . Smoking 11/07/2015  . Arthralgia 10/12/2015  . Left carotid stenosis 11/08/2014  . Diabetes mellitus without complication ([place]) [id number]  . NSTEMI (non-ST elevated myocardial infarction) ([place]) 01/20/2014  . Melena 01/20/2014  . Microcytic anemia 01/19/2014  . Chest pain 01/19/2014  . PAD (peripheral artery disease) ([place]) 10/12/2013  . S/P angioplasty with stent, lt. subclavian 07/31/11 08/01/2011  . Biventricular implantable cardioverter-defibrillator in situ 07/28/2011  . PVD, known severe, (previuosly asymptomatic) LSCA disease, now with "high grade" RICA disease. 07/28/2011  . Bronchitis, recent flare 07/28/2011  . Syncope,possible related to subclavian steal syndrome 07/27/2011  . Vertigo 07/27/2011  . Hypokalemia 07/27/2011  . SPINAL STENOSIS 01/28/2010  . CONSTIPATION 01/14/2010  . BACK PAIN, LUMBAR 01/01/2010  . INSOMNIA 01/23/2009  . ALLERGIC RHINITIS 11/10/2008  . Coronary atherosclerosis  06/21/2008  . HIP PAIN, BILATERAL 06/21/2008  . Myalgia and myositis 11/26/2007  . HYPERLIPIDEMIA, Hx of Niacin and statin intol 07/28/2007  . ACUTE SINUSITIS, UNSPECIFIED 07/28/2007  . WEIGHT GAIN 06/04/2007  . DYSPNEA 06/04/2007  . COPD (chronic obstructive pulmonary disease) with emphysema ([place]) 03/02/2007  . Depression 02/25/2007  . Essential hypertension 02/25/2007  . GERD 02/25/2007  . COLONIC POLYPS, HX OF 02/25/2007    Past  Surgical History:  Procedure Laterality Date  . ABDOMINAL AORTAGRAM N/A 08/12/2013   Procedure: ABDOMINAL Maxcine Ham;  Surgeon: Elam Dutch, MD;  Location: [facility] CATH LAB;  Service: Cardiovascular;  Laterality: N/A;  . ABDOMINAL HYSTERECTOMY    . APPENDECTOMY    . BACK SURGERY  2012  . BIV ICD GENERTAOR CHANGE OUT Left 02/20/2012   Procedure: BIV ICD GENERTAOR CHANGE OUT;  Surgeon: [name], MD;  Location: [facility] CATH LAB;  Service: Cardiovascular;  Laterality: Left;  . CARDIAC CATHETERIZATION  12/01/2007   By Dr. Melvern Banker, left heart cath,   . CARDIAC DEFIBRILLATOR PLACEMENT  05/2008   By Dr Blanch Media, [employer] CANNOT HAVE MRI's  . CAROTID ANGIOGRAM N/A 07/31/2011   Procedure: CAROTID ANGIOGRAM;  Surgeon: Lorretta Harp, MD;  Location[facility] CATH LAB;  Service: Cardiovascular;  Laterality: N/A;  carotid angiogram and possible Lt SCA PTA  . COLONOSCOPY W/ POLYPECTOMY  12/2013  . CORONARY ANGIOPLASTY    . ENDARTERECTOMY Left 11/08/2014   Procedure: LEFT CAROTID ENDARTERECTOMY WITH HEMASHIELD PATCH ANGIOPLASTY;  Surgeon: Elam Dutch, MD;  Location: [place];  Service: Vascular;  Laterality: Left;  . ESOPHAGOGASTRODUODENOSCOPY N/A 01/20/2014   Procedure: ESOPHAGOGASTRODUODENOSCOPY (EGD);  Surgeon: Jerene Bears, MD;  Location: [facility] ENDOSCOPY;  Service: Endoscopy;  Laterality: N/A;  . FEMORAL-POPLITEAL BYPASS GRAFT Right 10/12/2013   Procedure:   Femoral-Peroneal trunk  bypass with nonreversed greater saphenous vein graft;  Surgeon: Elam Dutch, MD;  Location: [place];  Service: Vascular;  Laterality: Right;  . GIVENS CAPSULE STUDY N/A 01/20/2014   Procedure: GIVENS CAPSULE STUDY;  Surgeon: Jerene Bears, MD;  Location: [place];  Service: Gastroenterology;  Laterality: N/A;  . INTRAOPERATIVE ARTERIOGRAM Right 10/12/2013   Procedure: INTRA OPERATIVE ARTERIOGRAM;  Surgeon: Elam Dutch, MD;  Location: [place];  Service: Vascular;  Laterality: Right;  . ORIF ELBOW FRACTURE  08/16/2011   Procedure: OPEN  REDUCTION INTERNAL FIXATION (ORIF) ELBOW/OLECRANON FRACTURE;  Surgeon: Schuyler Amor, MD;  Location: [place];  Service: Orthopedics;  Laterality: Left;  . RENAL ANGIOGRAM N/A 08/12/2013   Procedure: RENAL ANGIOGRAM;  Surgeon: Elam Dutch, MD;  Location: [facility] CATH LAB;  Service: Cardiovascular;  Laterality: N/A;  . SUBCLAVIAN STENT PLACEMENT Left 07/31/2011   7x18 [place], balloon, with reduction of 90% ostial left subclavian artery stenosis to 0% with residual excellent flow  . TONSILLECTOMY    . TUBAL LIGATION      OB History    No data available       Home Medications    Prior to Admission medications   Medication Sig Start Date End Date Taking? Authorizing Provider  ACCU-CHEK FASTCLIX LANCETS MISC CHECK BLOOD SUGAR ONCE DAILY 07/24/15  Yes Laurey Morale, MD  ACCU-CHEK SMARTVIEW test strip CHECK BLOOD SUGAR ONCE DAILY 08/09/15  Yes Laurey Morale, MD  albuterol (PROVENTIL HFA;VENTOLIN HFA) 108 (90 Base) MCG/ACT inhaler Inhale 2 puffs into the lungs every 4 (four) hours as needed for wheezing or shortness of breath. 10/12/15  Yes Laurey Morale, MD  allopurinol (ZYLOPRIM) 300  MG tablet Take 1 tablet (300 mg total) by mouth daily. 10/15/15  Yes Laurey Morale, MD  ALPRAZolam Duanne Moron) 1 MG tablet TAKE ONE TABLET BY MOUTH EVERY 6 HOURS AS NEEDED FOR ANXIETY 12/11/15  Yes Laurey Morale, MD  clopidogrel (PLAVIX) 75 MG tablet TAKE ONE TABLET BY MOUTH ONCE DAILY 02/20/16  Yes Laurey Morale, MD  DULoxetine (CYMBALTA) 60 MG capsule TAKE ONE CAPSULE BY MOUTH TWICE DAILY 02/04/16  Yes Laurey Morale, MD  furosemide (LASIX) 80 MG tablet Take 1 tablet (80 mg total) by mouth 2 (two) times daily. 10/29/15  Yes Laurey Morale, MD  HYDROcodone-acetaminophen (NORCO) 5-325 MG tablet Take 1 tablet by mouth every 6 (six) hours as needed for moderate pain. 02/14/16  Yes Laurey Morale, MD  ketotifen (ZADITOR) 0.025 % ophthalmic solution Place 1 drop into both eyes 2 (two) times daily.   Yes Historical Provider, MD    lisinopril (PRINIVIL,ZESTRIL) 20 MG tablet Take 1 tablet (20 mg total) by mouth daily. 10/29/15  Yes Laurey Morale, MD  magnesium oxide (MAG-OX) 400 MG tablet Take 1 tablet (400 mg total) by mouth daily. Patient taking differently: Take 400 mg by mouth 2 (two) times daily.  06/27/15  Yes Laurey Morale, MD  metFORMIN (GLUCOPHAGE) 500 MG tablet Take 1 tablet (500 mg total) by mouth 2 (two) times daily with a meal. 05/30/15  Yes Laurey Morale, MD  metoprolol tartrate (LOPRESSOR) 25 MG tablet Take 1 tablet (25 mg total) by mouth daily. 05/30/15  Yes Laurey Morale, MD  Triamcinolone Acetonide (NASACORT AQ NA) Place into the nose daily. Over the counter   Yes Historical Provider, MD  clopidogrel (PLAVIX) 75 MG tablet Take 1 tablet (75 mg total) by mouth daily. 06/27/15   Laurey Morale, MD  nitroGLYCERIN (NITROSTAT) 0.4 MG SL tablet Place 1 tablet (0.4 mg total) under the tongue every 5 (five) minutes x 3 doses as needed for chest pain. Patient not taking: Reported on 12/14/2015 10/12/15   Laurey Morale, MD  potassium chloride SA (KLOR-CON M20) 20 MEQ tablet Take 1 tablet (20 mEq total) by mouth 3 (three) times daily. Patient not taking: Reported on 02/06/2016 06/01/15   Laurey Morale, MD    Family History Family History  Problem Relation Age of Onset  . CAD Father   . Heart disease Father   . Hyperlipidemia Father   . Heart disease Mother   . Deep vein thrombosis Son   . Hyperlipidemia    . Colon cancer Maternal Grandmother   . Cancer Sister     ovarian  . Diabetes Sister   . Heart disease Sister   . Anesthesia problems Neg Hx   . Hypotension Neg Hx   . Malignant hyperthermia Neg Hx   . Pseudochol deficiency Neg Hx     Social History Social History  Substance Use Topics  . Smoking status: Current Every Day Smoker    Packs/day: 0.25    Years: 60.00    Types: Cigarettes  . Smokeless tobacco: Never Used     Comment: 1/2 pack per day  . Alcohol use No     Allergies   Codeine; Darvon;  Meloxicam; Norco [hydrocodone-acetaminophen]; Potassium-containing compounds; Propoxyphene n-acetaminophen; Rofecoxib; Rosuvastatin; Statins; Azithromycin; Erythromycin; Penicillins; and Sulfa antibiotics   Review of Systems Review of Systems  Reason unable to perform ROS: Altered mental status.     Physical Exam Updated Vital Signs BP 162/77 (BP Location: Right Arm)  Pulse 107   Temp 97.9 F (36.6 C) (Oral)   Resp 21   SpO2 97%   Physical Exam  Constitutional:  Answers questions in a reasonable fashion;  dry mucous membranes  HENT:  Head: Normocephalic and atraumatic.  Eyes: Conjunctivae are normal.  Neck: Neck supple.  Cardiovascular: Normal rate and regular rhythm.   Pulmonary/Chest: Effort normal and breath sounds normal.  Abdominal: Soft. Bowel sounds are normal.  Musculoskeletal: Normal range of motion.  Neurological: She is alert.  knows name and place  Skin: Skin is warm and dry.  Psychiatric: She has a normal mood and affect. Her behavior is normal.  Nursing note and vitals reviewed.    ED Treatments / Results  Labs (all labs ordered are listed, but only abnormal results are displayed) Labs Reviewed  CBC - Abnormal; Notable for the following:       Result Value   RDW 16.0 (*)    All other components within normal limits  COMPREHENSIVE METABOLIC PANEL - Abnormal; Notable for the following:    Glucose, Bld 228 (*)    BUN 58 (*)    Creatinine, Ser 2.79 (*)    GFR calc non Af Amer 15 (*)    GFR calc Af Amer 18 (*)    All other components within normal limits  CBG MONITORING, ED - Abnormal; Notable for the following:    Glucose-Capillary 242 (*)    All other components within normal limits  I-STAT CHEM 8, ED - Abnormal; Notable for the following:    Chloride 100 (*)    BUN 55 (*)    Creatinine, Ser 2.80 (*)    Glucose, Bld 218 (*)    Calcium, Ion 1.12 (*)    All other components within normal limits  PROTIME-INR  APTT  DIFFERENTIAL  URINALYSIS,  ROUTINE W REFLEX MICROSCOPIC (NOT AT [facility])  I-STAT TROPOININ, ED    EKG  EKG Interpretation  Date/Time:  Tuesday February 26 2016 08:44:06 EDT Ventricular Rate:  92 PR Interval:    QRS Duration: 116 QT Interval:  398 QTC Calculation: 493 R Axis:   26 Text Interpretation:  Sinus rhythm Paired ventricular premature complexes Aberrant conduction of SV complex(es) Incomplete left bundle branch block LVH with secondary repolarization abnormality Borderline prolonged QT interval Confirmed by Lacinda Axon  MD, Dylin Ihnen ([id number]) on 02/26/2016 10:53:47 AM       Radiology Ct Head Wo Contrast  Result Date: 02/26/2016 CLINICAL DATA:  Numerous falls, the patient is on anticoagulation EXAM: CT HEAD WITHOUT CONTRAST TECHNIQUE: Contiguous axial images were obtained from the base of the skull through the vertex without intravenous contrast. COMPARISON:  CT brain scan of 11/30/2013 FINDINGS: Brain: The ventricular system remains prominent, as are the cortical sulci, indicative of diffuse atrophy. The septum is midline in position. Moderate small vessel ischemic change is noted throughout the periventricular white matter. No hemorrhage, mass lesion, or acute infarction is seen. Vascular: On this unenhanced study, no vascular abnormality is noted. Skull: On bone window images, no calvarial abnormality is seen. Sinuses/Orbits: The paranasal sinuses are pneumatized. Other: None IMPRESSION: Atrophy and small vessel ischemic change. No acute intracranial abnormality. Electronically Signed   By: Ivar Drape M.D.   On: 02/26/2016 09:12    Procedures Procedures (including critical care time)  Medications Ordered in ED Medications  sodium chloride 0.9 % bolus 1,000 mL (not administered)     Initial Impression / Assessment and Plan / ED Course  I have reviewed the triage vital signs  and the nursing notes.  Pertinent labs & imaging results that were available during my care of the patient were reviewed by me and  considered in my medical decision making (see chart for details).  Clinical Course    Son reports altered mental status. Creatinine had been normal 1 year ago. Creatinine today was 2.8. CT head shows no acute findings. I suspect dehydration is the etiology for the elevated creatinine. Urinalysis pending. Admit to observation.  Final Clinical Impressions(s) / ED Diagnoses   Final diagnoses:  Altered mental status, unspecified altered mental status type  Renal insufficiency    New Prescriptions New Prescriptions   No medications on file     Nat Christen, MD 02/26/16 1303

## 2016-02-26 NOTE — ED Notes (Signed)
CBG 242; RN notified

## 2016-02-26 NOTE — ED Notes (Signed)
Pt ambulated to restroom w/ 1-stand by assist and walker; pt tolerated well

## 2016-02-27 ENCOUNTER — Observation Stay (HOSPITAL_BASED_OUTPATIENT_CLINIC_OR_DEPARTMENT_OTHER): Payer: Medicare Other

## 2016-02-27 DIAGNOSIS — N179 Acute kidney failure, unspecified: Secondary | ICD-10-CM

## 2016-02-27 DIAGNOSIS — Z95810 Presence of automatic (implantable) cardiac defibrillator: Secondary | ICD-10-CM | POA: Diagnosis not present

## 2016-02-27 DIAGNOSIS — J439 Emphysema, unspecified: Secondary | ICD-10-CM | POA: Diagnosis not present

## 2016-02-27 DIAGNOSIS — E119 Type 2 diabetes mellitus without complications: Secondary | ICD-10-CM

## 2016-02-27 DIAGNOSIS — R410 Disorientation, unspecified: Secondary | ICD-10-CM | POA: Diagnosis not present

## 2016-02-27 DIAGNOSIS — I10 Essential (primary) hypertension: Secondary | ICD-10-CM

## 2016-02-27 DIAGNOSIS — Z959 Presence of cardiac and vascular implant and graft, unspecified: Secondary | ICD-10-CM

## 2016-02-27 DIAGNOSIS — I739 Peripheral vascular disease, unspecified: Secondary | ICD-10-CM

## 2016-02-27 DIAGNOSIS — R55 Syncope and collapse: Secondary | ICD-10-CM | POA: Diagnosis not present

## 2016-02-27 DIAGNOSIS — I6522 Occlusion and stenosis of left carotid artery: Secondary | ICD-10-CM

## 2016-02-27 DIAGNOSIS — M25559 Pain in unspecified hip: Secondary | ICD-10-CM

## 2016-02-27 DIAGNOSIS — E785 Hyperlipidemia, unspecified: Secondary | ICD-10-CM

## 2016-02-27 LAB — BASIC METABOLIC PANEL
Anion gap: 9 (ref 5–15)
BUN: 47 mg/dL — AB (ref 6–20)
CHLORIDE: 104 mmol/L (ref 101–111)
CO2: 24 mmol/L (ref 22–32)
CREATININE: 2.19 mg/dL — AB (ref 0.44–1.00)
Calcium: 9.3 mg/dL (ref 8.9–10.3)
GFR calc Af Amer: 24 mL/min — ABNORMAL LOW (ref >60)
GFR calc non Af Amer: 20 mL/min — ABNORMAL LOW (ref >60)
GLUCOSE: 98 mg/dL (ref 65–99)
POTASSIUM: 5.1 mmol/L (ref 3.5–5.1)
Sodium: 137 mmol/L (ref 135–145)

## 2016-02-27 LAB — URINE CULTURE: CULTURE: NO GROWTH

## 2016-02-27 LAB — GLUCOSE, CAPILLARY
GLUCOSE-CAPILLARY: 124 mg/dL — AB (ref 65–99)
Glucose-Capillary: 190 mg/dL — ABNORMAL HIGH (ref 65–99)
Glucose-Capillary: 113 mg/dL — ABNORMAL HIGH (ref 65–99)
Glucose-Capillary: 202 mg/dL — ABNORMAL HIGH (ref 65–99)

## 2016-02-27 LAB — CBC
HEMATOCRIT: 39.3 % (ref 36.0–46.0)
Hemoglobin: 12.9 g/dL (ref 12.0–15.0)
MCH: 31.8 pg (ref 26.0–34.0)
MCHC: 32.8 g/dL (ref 30.0–36.0)
MCV: 96.8 fL (ref 78.0–100.0)
PLATELETS: 272 10*3/uL (ref 150–400)
RBC: 4.06 MIL/uL (ref 3.87–5.11)
RDW: 16.1 % — AB (ref 11.5–15.5)
WBC: 7.9 10*3/uL (ref 4.0–10.5)

## 2016-02-27 LAB — ECHOCARDIOGRAM COMPLETE
Height: 65 in
WEIGHTICAEL: 3100.8 [oz_av]

## 2016-02-27 LAB — HEMOGLOBIN A1C
HEMOGLOBIN A1C: 6.6 % — AB (ref 4.8–5.6)
Mean Plasma Glucose: 143 mg/dL

## 2016-02-27 MED ORDER — KETOTIFEN FUMARATE 0.025 % OP SOLN
1.0000 [drp] | Freq: Two times a day (BID) | OPHTHALMIC | Status: DC
  Administered 2016-02-29: 1 [drp] via OPHTHALMIC
  Filled 2016-02-27: qty 5

## 2016-02-27 NOTE — Progress Notes (Signed)
PROGRESS NOTE    Sydney Phillips  [id number] DOB: 1935/07/22 DOA: 02/26/2016 PCP: Laurey Morale, MD      Brief Narrative:  Sydney Phillips is a 80 y.o. 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    Assessment & Plan:   Principal Problem:   Syncopal episodes Active Problems:   HLD (hyperlipidemia)   Essential hypertension   COPD (chronic obstructive pulmonary disease) with emphysema (HCC)   HIP PAIN, BILATERAL   Biventricular implantable cardioverter-defibrillator in situ   PVD, known severe, (previuosly asymptomatic) LSCA disease, now with "high grade" RICA disease.   S/P angioplasty with stent, lt. subclavian 07/31/11   Diabetes mellitus without complication (HCC)   Left carotid stenosis   Cardiomyopathy ([place])   Altered mental state   Acute kidney injury ([place])   Skin lesion of back/upper right shoulder   Altered mental status, disorientation, likely due to metabolic/dehydration - improved  -CT head negative  -UA negative for infection  -PT: no follow up needed   Syncope - patient denies losing consciousness  -Device interrogation pending  -Echo as below   Acute kidney injury, likely secondary to dehydration as well as continued diuretic use, AKI improving with IVF  -Renal function 05/2015 BUN/Cr: 14/1.5  -Hold Lasix, ACE inhibitor and metformin -IVF -UA negative for infection   PVD, known severe, (previuosly asymptomatic) LSCA disease, now with "high grade" RICA disease/S/P angioplasty with stent, lt.  subclavian 07/31/11/Left carotid stenosis -As above no focal neurological deficits although with bilateral carotid disease in setting of dehydration could result in a syncopal episode   Essential hypertension  -Current blood pressure controlled -Lisinopril on hold as above -Continue Lopressor  COPD (chronic obstructive pulmonary disease) with emphysema  -Currently asymptomatic without wheezing -Continue preadmission albuterol MDI -Counseled regarding tobacco cessation  CAD, Biventricular implantable cardioverter-defibrillator in situ for CRT/combination of ischemic and nonischemic cardiomyopathy -Currently asymptomatic -Continue Plavix  Diabetes mellitus type 2 without complication, well controlled -Ha1c 6.6  -Hold metformin -Follow CBGs and provide SSI    Chronic bilateral hip pain  -Continue preadmission oxycodone  -Lumbar spine films obtained in August showed no evidence of acute compression fracture  Anxiety disorder -Continue preadmission Xanax and Cymbalta  Chronic gout -Continue allopurinol     DVT prophylaxis: lovenox Code Status: DNR Family Communication: No family present in room Disposition Plan: Observation status. Hopeful discharge tomorrow if continued improvement in kidney function and device interrogation unrevealing.    Consultants:   None  Procedures:   Echo 9/13:  Study Conclusions - Left ventricle: The cavity size was normal. Systolic function was mildly to moderately reduced. The estimated ejection fraction was in the range of 40% to 45%. Diffuse hypokinesis. Doppler parameters are consistent with abnormal left ventricular relaxation (grade 1 diastolic dysfunction). - Mitral valve: There was mild regurgitation. - Right ventricle: Pacer wire or catheter noted in right ventricle. - Pulmonary arteries: Systolic pressure was mildly increased. PA peak pressure: 36 mm Hg (S).  Impressions: - When compared to prior, EF is reduced  (55%)  Antimicrobials:   None     Subjective: Patient is alert, oriented 3. She is  no longer confused. No family is at that time. She states that she was folding clothes when she fell. She denies hitting her head or losing consciousness. She states that she has decreased appetite and has had poor oral intake recently. She states that she feels much better today than she did in the past week.  Objective: Vitals:   02/27/16 0451 02/27/16 0528 02/27/16 0900 02/27/16 1500  BP:  (!) 156/88 (!) 92/52 106/71  Pulse:  80 82 71  Resp:  20 20 20   Temp:  97.7 F (36.5 C) 98.6 F (37 C) 98.4 F (36.9 C)  TempSrc:  Oral Oral Oral  SpO2:  100% 96% 94%  Weight: 87.9 kg (193 lb 12.8 oz)     Height:        Intake/Output Summary (Last 24 hours) at 02/27/16 1758 Last data filed at 02/27/16 0900  Gross per 24 hour  Intake            720.5 ml  Output                0 ml  Net            720.5 ml   Filed Weights   02/26/16 2202 02/27/16 0451  Weight: 84.9 kg (187 lb 4.5 oz) 87.9 kg (193 lb 12.8 oz)    Examination:  General exam: Appears calm and comfortable  Respiratory system: Clear to auscultation. Respiratory effort normal. Cardiovascular system: S1 & S2 heard, RRR. No JVD, murmurs, rubs, gallops or clicks. No pedal edema. Gastrointestinal system: Abdomen is nondistended, soft and nontender. No organomegaly or masses felt. Normal bowel sounds heard. Central nervous system: Alert and oriented to self, place, time. No focal neurological deficits. Extremities: Symmetric 5 x 5 power. Skin: No rashes, lesions or ulcers Psychiatry: Judgement and insight appear normal. Mood & affect appropriate.   Data Reviewed: I have personally reviewed following labs and imaging studies  CBC:  Recent Labs Lab 02/26/16 0838 02/26/16 0857 02/27/16 0609  WBC 8.0  --  7.9  NEUTROABS 5.1  --   --   HGB 13.1 14.3 12.9  HCT 39.4 42.0 39.3  MCV 96.1  --  96.8  PLT 264  --  [id number]   Basic Metabolic  Panel:  Recent Labs Lab 02/26/16 0838 02/26/16 0857 02/26/16 1530 02/27/16 0609  NA 135 136  --  137  K 4.6 4.4  --  5.1  CL 101 100*  --  104  CO2 23  --   --  24  GLUCOSE 228* 218*  --  98  BUN 58* 55*  --  47*  CREATININE 2.79* 2.80*  --  2.19*  CALCIUM 9.6  --   --  9.3  MG  --   --  2.7*  --    GFR: Estimated Creatinine Clearance: 23.2 mL/min (by C-G formula based on SCr of 2.19 mg/dL (H)). Liver Function Tests:  Recent Labs Lab 02/26/16 0838  AST 29  ALT 18  ALKPHOS 63  BILITOT 0.4  PROT 7.0  ALBUMIN 4.1   No results for input(s): LIPASE, AMYLASE in the last 168 hours. No results for input(s): AMMONIA in the last 168 hours. Coagulation Profile:  Recent Labs Lab 02/26/16 0838  INR 0.95   Cardiac Enzymes: No results for input(s): CKTOTAL, CKMB, CKMBINDEX, TROPONINI in the last 168 hours. BNP (last 3 results) No results for input(s): PROBNP in the last 8760 hours. HbA1C:  Recent Labs  02/26/16 1319  HGBA1C  6.6*   CBG:  Recent Labs Lab 02/26/16 1643 02/26/16 2154 02/27/16 0639 02/27/16 1150 02/27/16 1640  GLUCAP 126* 180* 124* 190* 113*   Lipid Profile: No results for input(s): CHOL, HDL, LDLCALC, TRIG, CHOLHDL, LDLDIRECT in the last 72 hours. Thyroid Function Tests: No results for input(s): TSH, T4TOTAL, FREET4, T3FREE, THYROIDAB in the last 72 hours. Anemia Panel: No results for input(s): VITAMINB12, FOLATE, FERRITIN, TIBC, IRON, RETICCTPCT in the last 72 hours. Sepsis Labs: No results for input(s): PROCALCITON, LATICACIDVEN in the last 168 hours.  Recent Results (from the past 240 hour(s))  Culture, Urine     Status: None   Collection Time: 02/26/16 12:55 PM  Result Value Ref Range Status   Specimen Description URINE, RANDOM  Final   Special Requests NONE  Final   Culture NO GROWTH  Final   Report Status 02/27/2016 FINAL  Final         Radiology Studies: Ct Head Wo Contrast  Result Date: 02/26/2016 CLINICAL DATA:  Numerous  falls, the patient is on anticoagulation EXAM: CT HEAD WITHOUT CONTRAST TECHNIQUE: Contiguous axial images were obtained from the base of the skull through the vertex without intravenous contrast. COMPARISON:  CT brain scan of 11/30/2013 FINDINGS: Brain: The ventricular system remains prominent, as are the cortical sulci, indicative of diffuse atrophy. The septum is midline in position. Moderate small vessel ischemic change is noted throughout the periventricular white matter. No hemorrhage, mass lesion, or acute infarction is seen. Vascular: On this unenhanced study, no vascular abnormality is noted. Skull: On bone window images, no calvarial abnormality is seen. Sinuses/Orbits: The paranasal sinuses are pneumatized. Other: None IMPRESSION: Atrophy and small vessel ischemic change. No acute intracranial abnormality. Electronically Signed   By: Ivar Drape M.D.   On: 02/26/2016 09:12        Scheduled Meds: . allopurinol  300 mg Oral Daily  . clopidogrel  75 mg Oral Daily  . DULoxetine  60 mg Oral BID  . enoxaparin (LOVENOX) injection  30 mg Subcutaneous Q24H  . fluticasone  1 spray Each Nare Daily  . insulin aspart  0-5 Units Subcutaneous QHS  . insulin aspart  0-9 Units Subcutaneous TID WC  . ketotifen  1 drop Both Eyes BID  . magnesium oxide  400 mg Oral BID  . metoprolol tartrate  25 mg Oral Daily  . sodium chloride flush  3 mL Intravenous Q12H   Continuous Infusions: . sodium chloride 50 mL/hr at 02/27/16 1548     LOS: 0 days    Time spent: 40 minutes    Dessa Phi, DO [facility] Pager [phone]  If 7PM-7AM, please contact night-coverage [email or web address] Password [facility] 02/27/2016, 5:58 PM

## 2016-02-27 NOTE — Progress Notes (Signed)
  Echocardiogram 2D Echocardiogram has been performed.  Sydney Phillips 02/27/2016, 2:42 PM

## 2016-02-27 NOTE — Care Management Obs Status (Signed)
[facility] NOTIFICATION   Patient Details  Name: Sydney Phillips MRN: [id number] Date of Birth: 11-22-1935   Medicare Observation Status Notification Given:  Yes    Pollie Friar, RN 02/27/2016, 12:06 PM

## 2016-02-27 NOTE — Progress Notes (Signed)
Physical Therapy Treatment Patient Details Name: Sydney Phillips MRN: [id number] DOB: May 07, 1936 Today's Date: 02/27/2016    History of Present Illness pt is a 80 y/o female with pmh of combo NI/ICM, COPD, HTN, PVD, corotid stenosis, subclavian stenting, admitted with repors of confusion and recurrent fall day before admission.  CT not showing any acute abnormality.    PT Comments    Pt is close to baseline functioning and should be safe at home with available assist. There are no further acute PT needs.  Will sign off at this time.   Follow Up Recommendations  No PT follow up     Equipment Recommendations  None recommended by PT    Recommendations for Other Services       Precautions / Restrictions Precautions Precautions: Fall Restrictions Weight Bearing Restrictions: No    Mobility  Bed Mobility Overal bed mobility: Needs Assistance Bed Mobility: Supine to Sit     Supine to sit: Supervision        Transfers Overall transfer level: Needs assistance   Transfers: Sit to/from Stand Sit to Stand: Supervision            Ambulation/Gait Ambulation/Gait assistance: Supervision Ambulation Distance (Feet): 150 Feet Assistive device: Rolling walker (2 wheeled) Gait Pattern/deviations: Step-through pattern Gait velocity: slower Gait velocity interpretation: at or above normal speed for age/gender General Gait Details: generally steady, but lets the RW get too far in front of her   Stairs Stairs: Yes Stairs assistance: Supervision Stair Management: Two rails;Step to pattern;Forwards Number of Stairs: 4 General stair comments: safe with rail  Wheelchair Mobility    Modified Rankin (Stroke Patients Only)       Balance Overall balance assessment: Needs assistance   Sitting balance-Leahy Scale: Good     Standing balance support: Bilateral upper extremity supported Standing balance-Leahy Scale: Poor                      Cognition  Arousal/Alertness: Awake/alert Behavior During Therapy: WFL for tasks assessed/performed Overall Cognitive Status: Within Functional Limits for tasks assessed                      Exercises      General Comments General comments (skin integrity, edema, etc.): Saw nothing overtly wrong to cause pt to fall.  She could use the RW more safely.`      Pertinent Vitals/Pain Pain Assessment: Faces Faces Pain Scale: Hurts a little bit Pain Location: spot R buttock Pain Descriptors / Indicators: Sore Pain Intervention(s): Monitored during session    Home Living Family/patient expects to be discharged to:: Private residence Living Arrangements: Children Available Help at Discharge: Available PRN/intermittently (son works 7-3:30 ish) Type of Home: House Home Access: Stairs to enter Entrance Stairs-Rails:  (grab bar) Home Layout: One level Home Equipment: Environmental consultant - 2 wheels;Walker - 4 wheels;Cane - single point;Bedside commode;Tub bench;Grab bars - tub/shower      Prior Function Level of Independence: Independent with assistive device(s)          PT Goals (current goals can now be found in the care plan section) Acute Rehab PT Goals PT Goal Formulation: All assessment and education complete, DC therapy    Frequency       PT Plan      Co-evaluation             End of Session   Activity Tolerance: Patient tolerated treatment well Patient left: in bed;with call bell/phone within  reach;with bed alarm set     Time: [phone] PT Time Calculation (min) (ACUTE ONLY): 37 min  Charges:  $Gait Training: 8-22 mins                    G Codes:  Functional Assessment Tool Used: clinical judgement   Sydney Phillips, Tessie Fass 02/27/2016, 4:46 PM 02/27/2016  Sydney Phillips, Sydney Phillips [phone]  (pager)

## 2016-02-27 NOTE — Progress Notes (Signed)
Progress not entered in error

## 2016-02-28 DIAGNOSIS — N179 Acute kidney failure, unspecified: Secondary | ICD-10-CM | POA: Diagnosis not present

## 2016-02-28 DIAGNOSIS — R410 Disorientation, unspecified: Secondary | ICD-10-CM | POA: Diagnosis not present

## 2016-02-28 DIAGNOSIS — R55 Syncope and collapse: Secondary | ICD-10-CM

## 2016-02-28 DIAGNOSIS — Z95810 Presence of automatic (implantable) cardiac defibrillator: Secondary | ICD-10-CM | POA: Diagnosis not present

## 2016-02-28 DIAGNOSIS — J439 Emphysema, unspecified: Secondary | ICD-10-CM | POA: Diagnosis not present

## 2016-02-28 LAB — CBC WITH DIFFERENTIAL/PLATELET
Basophils Absolute: 0 10*3/uL (ref 0.0–0.1)
Basophils Relative: 0 %
Eosinophils Absolute: 0.3 10*3/uL (ref 0.0–0.7)
Eosinophils Relative: 4 %
HEMATOCRIT: 38.2 % (ref 36.0–46.0)
HEMOGLOBIN: 12.3 g/dL (ref 12.0–15.0)
LYMPHS ABS: 1.7 10*3/uL (ref 0.7–4.0)
LYMPHS PCT: 24 %
MCH: 31.7 pg (ref 26.0–34.0)
MCHC: 32.2 g/dL (ref 30.0–36.0)
MCV: 98.5 fL (ref 78.0–100.0)
MONOS PCT: 8 %
Monocytes Absolute: 0.5 10*3/uL (ref 0.1–1.0)
NEUTROS ABS: 4.3 10*3/uL (ref 1.7–7.7)
NEUTROS PCT: 64 %
Platelets: 234 10*3/uL (ref 150–400)
RBC: 3.88 MIL/uL (ref 3.87–5.11)
RDW: 15.9 % — ABNORMAL HIGH (ref 11.5–15.5)
WBC: 6.8 10*3/uL (ref 4.0–10.5)

## 2016-02-28 LAB — GLUCOSE, CAPILLARY
Glucose-Capillary: 107 mg/dL — ABNORMAL HIGH (ref 65–99)
Glucose-Capillary: 143 mg/dL — ABNORMAL HIGH (ref 65–99)
Glucose-Capillary: 155 mg/dL — ABNORMAL HIGH (ref 65–99)
Glucose-Capillary: 114 mg/dL — ABNORMAL HIGH (ref 65–99)

## 2016-02-28 LAB — BASIC METABOLIC PANEL
Anion gap: 8 (ref 5–15)
BUN: 34 mg/dL — ABNORMAL HIGH (ref 6–20)
CHLORIDE: 112 mmol/L — AB (ref 101–111)
CO2: 21 mmol/L — AB (ref 22–32)
CREATININE: 1.77 mg/dL — AB (ref 0.44–1.00)
Calcium: 9.3 mg/dL (ref 8.9–10.3)
GFR calc non Af Amer: 26 mL/min — ABNORMAL LOW (ref >60)
GFR, EST AFRICAN AMERICAN: 31 mL/min — AB (ref >60)
Glucose, Bld: 200 mg/dL — ABNORMAL HIGH (ref 65–99)
Potassium: 5.5 mmol/L — ABNORMAL HIGH (ref 3.5–5.1)
Sodium: 141 mmol/L (ref 135–145)

## 2016-02-28 NOTE — Progress Notes (Signed)
Writer called cath lab and notified about patient's ICD interrogations.

## 2016-02-28 NOTE — Progress Notes (Signed)
PROGRESS NOTE    Sydney Phillips  [id number] DOB: [birth year] DOA: 02/26/2016 PCP: Laurey Morale, MD      Brief Narrative:  Sydney Phillips is a 80 y.o. 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    Assessment & Plan:   Principal Problem:   Acute kidney injury ([place]) Active Problems:   HLD (hyperlipidemia)   Essential hypertension   COPD (chronic obstructive pulmonary disease) with emphysema (HCC)   HIP PAIN, BILATERAL   Biventricular implantable cardioverter-defibrillator in situ   PVD, known severe, (previuosly asymptomatic) LSCA disease, now with "high grade" RICA disease.   S/P angioplasty with stent, lt. subclavian 07/31/11   Diabetes mellitus without complication (HCC)   Left carotid stenosis   Cardiomyopathy ([place])   Altered mental state   Syncopal episodes   Skin lesion of back/upper right shoulder   Altered mental status, disorientation, likely due to metabolic/dehydration - improved  -CT head negative  -UA negative for infection  -PT: no follow up needed   Syncope vs. Simple fall without loss of consiousness -BiV ICD device interrogation pending  -Echo as below   Acute kidney injury, likely secondary to dehydration as well as continued diuretic use, AKI improving with IVF  -Renal function 05/2015 BUN/Cr: 14/1.5  -Hold Lasix, ACE inhibitor and metformin -IVF -UA negative for infection  -Repeat BMP tomorrow   PVD, known severe, (previuosly asymptomatic) LSCA disease, now with "high grade" RICA  disease/S/P angioplasty with stent, lt. subclavian 07/31/11/Left carotid stenosis -As above no focal neurological deficits although with bilateral carotid disease in setting of dehydration could result in a syncopal episode   Essential hypertension  -Current blood pressure controlled -Lisinopril on hold as above -Continue Lopressor  COPD (chronic obstructive pulmonary disease) with emphysema  -Currently asymptomatic without wheezing -Continue preadmission albuterol MDI -Counseled regarding tobacco cessation  CAD, Biventricular implantable cardioverter-defibrillator in situ for CRT/combination of ischemic and nonischemic cardiomyopathy -Currently asymptomatic -Continue Plavix  Diabetes mellitus type 2 without complication, well controlled -Ha1c 6.6  -Hold metformin -Follow CBGs and provide SSI    Chronic bilateral hip pain  -Continue preadmission oxycodone  -Lumbar spine films obtained in August showed no evidence of acute compression fracture  Anxiety disorder -Continue preadmission Xanax and Cymbalta  Chronic gout -Continue allopurinol     DVT prophylaxis: lovenox Code Status: DNR Family Communication: Spoke with [name] (son) over the phone with updates this afternoon Disposition Plan: Patient admitted inpatient for continued treatment for AKI with IVF, and ICD device interrogation. She will be in the hospital for >2 midnights for further treatment. She continues to improve in mentation. Hopeful discharge tomorrow if continued improvement in kidney function and device interrogation unrevealing.   Consultants:   None  Procedures:   Echo 9/13:  Study Conclusions - Left ventricle: The cavity size was normal. Systolic function was mildly to moderately reduced. The estimated ejection fraction was in the range of 40% to 45%. Diffuse hypokinesis. Doppler parameters are consistent with abnormal left ventricular relaxation (grade 1 diastolic dysfunction). - Mitral valve:  There was mild regurgitation. - Right ventricle: Pacer wire or catheter noted in right ventricle. -  Pulmonary arteries: Systolic pressure was mildly increased. PA peak pressure: 36 mm Hg (S).  Impressions: - When compared to prior, EF is reduced (55%)  Antimicrobials:   None     Subjective: Patient is alert, oriented 3, although she does not recall meeting me or speaking with me yesterday. She has no complaints this afternoon. She has been tolerating diet although is limited due to lack of teeth. She denies any other complaints this morning.   Objective: Vitals:   02/28/16 0500 02/28/16 0534 02/28/16 0914 02/28/16 1000  BP:  128/61 105/68   Pulse:  81 85   Resp:  18 20   Temp:  98.7 F (37.1 C) 98.5 F (36.9 C)   TempSrc:  Oral Oral   SpO2:  93% 94%   Weight: 89 kg (196 lb 4.8 oz)   89 kg (196 lb 3.4 oz)  Height:    5\' 5"  (1.651 m)    Intake/Output Summary (Last 24 hours) at 02/28/16 1339 Last data filed at 02/28/16 0952  Gross per 24 hour  Intake             1820 ml  Output                0 ml  Net             1820 ml   Filed Weights   02/27/16 0451 02/28/16 0500 02/28/16 1000  Weight: 87.9 kg (193 lb 12.8 oz) 89 kg (196 lb 4.8 oz) 89 kg (196 lb 3.4 oz)    Examination:  General exam: Appears calm and comfortable  Respiratory system: Clear to auscultation. Respiratory effort normal. Cardiovascular system: S1 & S2 heard, RRR. No JVD, murmurs, rubs, gallops or clicks. No pedal edema. Gastrointestinal system: Abdomen is nondistended, soft and nontender. No organomegaly or masses felt. Normal bowel sounds heard. Central nervous system: Alert and oriented to self, place, time. No focal neurological deficits. Extremities: Symmetric 5 x 5 power. Skin: No rashes, lesions or ulcers Psychiatry: Judgement and insight appear normal. Mood & affect appropriate.   Data Reviewed: I have personally reviewed following labs and imaging studies  CBC:  Recent Labs Lab  02/26/16 0838 02/26/16 0857 02/27/16 0609 02/28/16 0936  WBC 8.0  --  7.9 6.8  NEUTROABS 5.1  --   --  4.3  HGB 13.1 14.3 12.9 12.3  HCT 39.4 42.0 39.3 38.2  MCV 96.1  --  96.8 98.5  PLT 264  --  272 [id number]   Basic Metabolic Panel:  Recent Labs Lab 02/26/16 0838 02/26/16 0857 02/26/16 1530 02/27/16 0609 02/28/16 0936  NA 135 136  --  137 141  K 4.6 4.4  --  5.1 5.5*  CL 101 100*  --  104 112*  CO2 23  --   --  24 21*  GLUCOSE 228* 218*  --  98 200*  BUN 58* 55*  --  47* 34*  CREATININE 2.79* 2.80*  --  2.19* 1.77*  CALCIUM 9.6  --   --  9.3 9.3  MG  --   --  2.7*  --   --    GFR: Estimated Creatinine Clearance: 28.9 mL/min (by C-G formula based on SCr of 1.77 mg/dL (H)). Liver Function Tests:  Recent Labs Lab 02/26/16 0838  AST 29  ALT 18  ALKPHOS 63  BILITOT 0.4  PROT 7.0  ALBUMIN 4.1   No results for input(s): LIPASE, AMYLASE in the last 168 hours. No results for input(s):  AMMONIA in the last 168 hours. Coagulation Profile:  Recent Labs Lab 02/26/16 0838  INR 0.95   Cardiac Enzymes: No results for input(s): CKTOTAL, CKMB, CKMBINDEX, TROPONINI in the last 168 hours. BNP (last 3 results) No results for input(s): PROBNP in the last 8760 hours. HbA1C:  Recent Labs  02/26/16 1319  HGBA1C 6.6*   CBG:  Recent Labs Lab 02/27/16 1150 02/27/16 1640 02/27/16 2119 02/28/16 0613 02/28/16 1159  GLUCAP 190* 113* 202* 107* 143*   Lipid Profile: No results for input(s): CHOL, HDL, LDLCALC, TRIG, CHOLHDL, LDLDIRECT in the last 72 hours. Thyroid Function Tests: No results for input(s): TSH, T4TOTAL, FREET4, T3FREE, THYROIDAB in the last 72 hours. Anemia Panel: No results for input(s): VITAMINB12, FOLATE, FERRITIN, TIBC, IRON, RETICCTPCT in the last 72 hours. Sepsis Labs: No results for input(s): PROCALCITON, LATICACIDVEN in the last 168 hours.  Recent Results (from the past 240 hour(s))  Culture, Urine     Status: None   Collection Time: 02/26/16  12:55 PM  Result Value Ref Range Status   Specimen Description URINE, RANDOM  Final   Special Requests NONE  Final   Culture NO GROWTH  Final   Report Status 02/27/2016 FINAL  Final         Radiology Studies: No results found.      Scheduled Meds: . allopurinol  300 mg Oral Daily  . clopidogrel  75 mg Oral Daily  . DULoxetine  60 mg Oral BID  . enoxaparin (LOVENOX) injection  30 mg Subcutaneous Q24H  . fluticasone  1 spray Each Nare Daily  . insulin aspart  0-5 Units Subcutaneous QHS  . insulin aspart  0-9 Units Subcutaneous TID WC  . ketotifen  1 drop Both Eyes BID  . magnesium oxide  400 mg Oral BID  . metoprolol tartrate  25 mg Oral Daily  . sodium chloride flush  3 mL Intravenous Q12H   Continuous Infusions: . sodium chloride 50 mL/hr at 02/28/16 1025     LOS: 0 days    Time spent: 30 minutes    Dessa Phi, DO [facility] Pager [phone]  If 7PM-7AM, please contact night-coverage [email or web address] Password [email or web address] 02/28/2016, 1:39 PM

## 2016-02-29 DIAGNOSIS — R410 Disorientation, unspecified: Secondary | ICD-10-CM | POA: Diagnosis not present

## 2016-02-29 DIAGNOSIS — J439 Emphysema, unspecified: Secondary | ICD-10-CM | POA: Diagnosis not present

## 2016-02-29 DIAGNOSIS — N179 Acute kidney failure, unspecified: Secondary | ICD-10-CM | POA: Diagnosis not present

## 2016-02-29 DIAGNOSIS — Z95810 Presence of automatic (implantable) cardiac defibrillator: Secondary | ICD-10-CM | POA: Diagnosis not present

## 2016-02-29 LAB — CBC WITH DIFFERENTIAL/PLATELET
Basophils Absolute: 0 10*3/uL (ref 0.0–0.1)
Basophils Relative: 0 %
EOS PCT: 5 %
Eosinophils Absolute: 0.3 10*3/uL (ref 0.0–0.7)
HCT: 38.5 % (ref 36.0–46.0)
HEMOGLOBIN: 12.5 g/dL (ref 12.0–15.0)
LYMPHS ABS: 1.9 10*3/uL (ref 0.7–4.0)
LYMPHS PCT: 27 %
MCH: 31.6 pg (ref 26.0–34.0)
MCHC: 32.5 g/dL (ref 30.0–36.0)
MCV: 97.2 fL (ref 78.0–100.0)
MONOS PCT: 7 %
Monocytes Absolute: 0.5 10*3/uL (ref 0.1–1.0)
NEUTROS PCT: 61 %
Neutro Abs: 4.2 10*3/uL (ref 1.7–7.7)
Platelets: 255 10*3/uL (ref 150–400)
RBC: 3.96 MIL/uL (ref 3.87–5.11)
RDW: 15.7 % — ABNORMAL HIGH (ref 11.5–15.5)
WBC: 6.9 10*3/uL (ref 4.0–10.5)

## 2016-02-29 LAB — BASIC METABOLIC PANEL
Anion gap: 7 (ref 5–15)
BUN: 27 mg/dL — AB (ref 6–20)
CHLORIDE: 112 mmol/L — AB (ref 101–111)
CO2: 21 mmol/L — ABNORMAL LOW (ref 22–32)
CREATININE: 1.42 mg/dL — AB (ref 0.44–1.00)
Calcium: 9.3 mg/dL (ref 8.9–10.3)
GFR calc Af Amer: 40 mL/min — ABNORMAL LOW (ref >60)
GFR calc non Af Amer: 34 mL/min — ABNORMAL LOW (ref >60)
GLUCOSE: 94 mg/dL (ref 65–99)
POTASSIUM: 5.6 mmol/L — AB (ref 3.5–5.1)
Sodium: 140 mmol/L (ref 135–145)

## 2016-02-29 LAB — GLUCOSE, CAPILLARY
GLUCOSE-CAPILLARY: 144 mg/dL — AB (ref 65–99)
Glucose-Capillary: 102 mg/dL — ABNORMAL HIGH (ref 65–99)

## 2016-02-29 NOTE — Progress Notes (Signed)
Pt discharge education and instructions completed with pt and son at bedside; all voices understanding and denies any questions. Pt IV removed; pt discharge home with son to transport her home. Pt transported off unit via wheelchair with son and belongings to the side. Delia Heady RN

## 2016-02-29 NOTE — Discharge Summary (Signed)
Physician Discharge Summary  Sydney Phillips [id number] DOB: 1935-12-02 DOA: 02/26/2016  PCP: Laurey Morale, MD  Admit date: 02/26/2016 Discharge date: 02/29/2016  Admitted From: Home  Disposition:  Home  Recommendations for Outpatient Follow-up:  1. Follow up with PCP in 1 week  2. Please obtain BMP and Mg on Monday 9/18  Home Health:No  Equipment/Devices:No  Discharge Condition:Stable CODE STATUS:DNR Diet recommendation: Heart Healthy / Carb Modified  Brief/Interim Summary: Sydney Amesquita Chambersis a 80 y.o.femalewith medical history significant for combination nonischemic ischemic cardiomyopathy status post CRT on chronic Lasix, COPD and ongoing tobacco abuse, diabetes, hypertension,peripheral vascular disease with known left carotid stenosis and prior left subclavian and plasty with stent, history of recurrent syncopal episodes previously felt to be related to subclavian steal syndrome, recent issues with right hip pain and dyslipidemia who presented with chief complaint of confusion and recurrent falls.   Her altered mentation was thought to be due to dehydration and AKI as she improved markedly with IVF. Her ICD was interrogated which showed no events. Echo was as below. AKI was improved with holding nephrotoxins, UA was negative. IVF was given. Patient also had mild hyperkalemia and hypermagnesemia. EKG did not show acute changes associated with hyperkalemia. Patient was instructed to stop her supplemental Mag Ox and Klor Con. She should follow up with PCP in 1 week and have a repeat BMP and Mg on Monday 9/18.  Discharge Diagnoses:  Principal Problem:   Acute kidney injury ([place]) Active Problems:   HLD (hyperlipidemia)   Essential hypertension   COPD (chronic obstructive pulmonary disease) with emphysema (HCC)   HIP PAIN, BILATERAL   Biventricular implantable cardioverter-defibrillator in situ   PVD, known severe, (previuosly asymptomatic) LSCA disease, now with "high grade"  RICA disease.   S/P angioplasty with stent, lt. subclavian 07/31/11   Diabetes mellitus without complication (HCC)   Left carotid stenosis   Cardiomyopathy ([place])   Altered mental state   Syncopal episodes   Skin lesion of back/upper right shoulder  Altered mental status, disorientation, likely due to metabolic/dehydration - resolved -CT head negative  -UA negative for infection  -PT: no follow up needed   Syncope vs. Simple fall without loss of consiousness -BiV ICD device interrogation - negative for events  -Echo as below   Acute kidney injury, likely secondary to dehydration as well as continued diuretic use, AKI improving with IVF  -Renal function 05/2015 BUN/Cr: 14/1.5  -Hold Lasix, ACE inhibitor and metformin -IVF -UA negative for infection   Hyperkalemia and Hypermagnesemia -No acute EKG changes -Stop home Klor Con and Mag Ox until instructed by PCP  -Repeat labs on Monday   PVD, known severe, (previuosly asymptomatic) LSCA disease, now with "high grade" RICA disease/S/P angioplasty with stent, lt. subclavian 07/31/11/Left carotid stenosis -As above no focal neurological deficits although with bilateral carotid disease in setting of dehydration could result in asyncopal episode   Essential hypertension  -Current blood pressure controlled -Lisinopril on hold as above -Continue Lopressor  COPD (chronic obstructive pulmonary disease) with emphysema  -Currently asymptomatic without wheezing -Continue preadmission albuterol MDI -Counseled regarding tobacco cessation  CAD, Biventricular implantable cardioverter-defibrillator in situ for CRT/combination of ischemic and nonischemic cardiomyopathy -Currently asymptomatic -Continue Plavix  Diabetes mellitus type 2 without complication, well controlled -Ha1c 6.6  -Hold metformin -Follow CBGs and provide SSI  Chronic bilateral hip pain  -Continue preadmission oxycodone  -Lumbar spine films obtained in August  showed no evidence of acute compression fracture  Anxiety disorder -Continue preadmission Xanax and  Cymbalta  Chronic gout -Continue allopurinol    Discharge Instructions  Discharge Instructions    Diet - low sodium heart healthy    Complete by:  As directed    Discharge instructions    Complete by:  As directed    Stop potassium and magnesium supplements as well as lasix and lisinopril until further instruction from PCP.  Have your blood work checked on Monday 9/18   Increase activity slowly    Complete by:  As directed        Medication List    STOP taking these medications   furosemide 80 MG tablet Commonly known as:  LASIX   lisinopril 20 MG tablet Commonly known as:  PRINIVIL,ZESTRIL   magnesium oxide 400 MG tablet Commonly known as:  MAG-OX   potassium chloride SA 20 MEQ tablet Commonly known as:  KLOR-CON M20     TAKE these medications   ACCU-CHEK FASTCLIX LANCETS Misc CHECK BLOOD SUGAR ONCE DAILY   ACCU-CHEK SMARTVIEW test strip Generic drug:  glucose blood CHECK BLOOD SUGAR ONCE DAILY   albuterol 108 (90 Base) MCG/ACT inhaler Commonly known as:  PROVENTIL HFA;VENTOLIN HFA Inhale 2 puffs into the lungs every 4 (four) hours as needed for wheezing or shortness of breath.   allopurinol 300 MG tablet Commonly known as:  ZYLOPRIM Take 1 tablet (300 mg total) by mouth daily.   ALPRAZolam 1 MG tablet Commonly known as:  XANAX TAKE ONE TABLET BY MOUTH EVERY 6 HOURS AS NEEDED FOR ANXIETY   clopidogrel 75 MG tablet Commonly known as:  PLAVIX Take 1 tablet (75 mg total) by mouth daily.   clopidogrel 75 MG tablet Commonly known as:  PLAVIX TAKE ONE TABLET BY MOUTH ONCE DAILY   DULoxetine 60 MG capsule Commonly known as:  CYMBALTA TAKE ONE CAPSULE BY MOUTH TWICE DAILY   HYDROcodone-acetaminophen 5-325 MG tablet Commonly known as:  NORCO Take 1 tablet by mouth every 6 (six) hours as needed for moderate pain.   ketotifen 0.025 % ophthalmic  solution Commonly known as:  ZADITOR Place 1 drop into both eyes 2 (two) times daily.   metFORMIN 500 MG tablet Commonly known as:  GLUCOPHAGE Take 1 tablet (500 mg total) by mouth 2 (two) times daily with a meal.   metoprolol tartrate 25 MG tablet Commonly known as:  LOPRESSOR Take 1 tablet (25 mg total) by mouth daily.   NASACORT AQ NA Place into the nose daily. Over the counter   nitroGLYCERIN 0.4 MG SL tablet Commonly known as:  NITROSTAT Place 1 tablet (0.4 mg total) under the tongue every 5 (five) minutes x 3 doses as needed for chest pain.       Allergies  Allergen Reactions  . Codeine Itching  . Darvon Itching  . Meloxicam Other (See Comments)    Unknown    . Norco [Hydrocodone-Acetaminophen]     itching  . Potassium-Containing Compounds Other (See Comments)    Causes severe constipation  . Propoxyphene N-Acetaminophen Itching  . Rofecoxib Other (See Comments)    Unknown    . Rosuvastatin Other (See Comments)    cramps  . Statins Itching and Other (See Comments)    Sleeplessness  . Azithromycin Rash  . Erythromycin Rash  . Penicillins Rash  . Sulfa Antibiotics Rash    Consultations:  None   Procedures/Studies: Dg Lumbar Spine Complete  Result Date: 02/11/2016 CLINICAL DATA:  Chronic low back and bilateral hip pain. Increased difficulty walking for the past months. Recent fall  2 months ago. History of previous low back surgery EXAM: LUMBAR SPINE - COMPLETE 4+ VIEW COMPARISON:  Lumbar spine series dated November 18, 2012 FINDINGS: The bones are subjectively osteopenic. The patient has undergone previous PLIF at L4-5 and L5-S1. The metallic hardware appears intact. There is mild degenerative disc space narrowing at T12-L1 and at L2-L3. There are anterior posterior endplate osteophytes at multiple levels. There is facet joint hypertrophy at L3-4. There is dense calcification in the wall of the abdominal aorta and common iliac arteries with a stent present in the  right common iliac artery. IMPRESSION: 1. No acute compression fracture. Stable appearance of the lower lumbar fusion hardware. Mild degenerative disc disease and facet joint hypertrophy as described. Mild superior endplate depression at L1 and L2 is stable. 2. Aortoiliac atherosclerosis. Electronically Signed   By: David  Martinique M.D.   On: 02/11/2016 16:27   Ct Head Wo Contrast  Result Date: 02/26/2016 CLINICAL DATA:  Numerous falls, the patient is on anticoagulation EXAM: CT HEAD WITHOUT CONTRAST TECHNIQUE: Contiguous axial images were obtained from the base of the skull through the vertex without intravenous contrast. COMPARISON:  CT brain scan of 11/30/2013 FINDINGS: Brain: The ventricular system remains prominent, as are the cortical sulci, indicative of diffuse atrophy. The septum is midline in position. Moderate small vessel ischemic change is noted throughout the periventricular white matter. No hemorrhage, mass lesion, or acute infarction is seen. Vascular: On this unenhanced study, no vascular abnormality is noted. Skull: On bone window images, no calvarial abnormality is seen. Sinuses/Orbits: The paranasal sinuses are pneumatized. Other: None IMPRESSION: Atrophy and small vessel ischemic change. No acute intracranial abnormality. Electronically Signed   By: Ivar Drape M.D.   On: 02/26/2016 09:12   Dg Hips Bilat With Pelvis 3-4 Views  Result Date: 02/11/2016 CLINICAL DATA:  Chronic lumbago and hip pain for approximately 1 month EXAM: DG HIP (WITH OR WITHOUT PELVIS) 3-4V BILAT COMPARISON:  CT abdomen and pelvis with bony reformats January 14, 2013 FINDINGS: Frontal pelvis as well as frontal and lateral views of each hip -total five views -obtained. Postoperative changes noted in the lower lumbar spine. There is no acute fracture or dislocation. There is mild symmetric narrowing of both hip joints. There is bony overgrowth along each greater trochanter. There is a small sclerotic focus in the right  intertrochanteric proximal femur as well as a second similar appearing focus in the lateral aspect of the right superior pubic ramus. IMPRESSION: No acute fracture or dislocation. Mild symmetric narrowing both hip joints. Postoperative change lower lumbar spine. Small sclerotic bony foci probably represent small phleboliths. Alanson Aly were present on prior CT examination from 2014 Electronically Signed   By: Lowella Grip III M.D.   On: 02/11/2016 16:26  Echo Study Conclusions - Left ventricle: The cavity size was normal. Systolic function was   mildly to moderately reduced. The estimated ejection fraction was   in the range of 40% to 45%. Diffuse hypokinesis. Doppler   parameters are consistent with abnormal left ventricular   relaxation (grade 1 diastolic dysfunction). - Mitral valve: There was mild regurgitation. - Right ventricle: Pacer wire or catheter noted in right ventricle. - Pulmonary arteries: Systolic pressure was mildly increased. PA   peak pressure: 36 mm Hg (S).  Impressions: - When compared to prior, EF is reduced (55%)   Subjective: Patient feeling well this morning. She denies any chest pain, shortness of breath, n/v/d, abdominal pain. Ready to go home   Discharge Exam: Vitals:  02/29/16 0911 02/29/16 1000  BP: 107/60 116/70  Pulse: 81 84  Resp: 17 18  Temp: 97.9 F (36.6 C) 98.2 F (36.8 C)   Vitals:   02/29/16 0051 02/29/16 0616 02/29/16 0911 02/29/16 1000  BP: 118/70 139/81 107/60 116/70  Pulse: 81 82 81 84  Resp: 18 18 17 18   Temp: 98.2 F (36.8 C) 98.4 F (36.9 C) 97.9 F (36.6 C) 98.2 F (36.8 C)  TempSrc: Oral Oral Oral   SpO2: 97% 96% 98% 98%  Weight:      Height:        General exam: Appears calm and comfortable  Respiratory system: Clear to auscultation. Respiratory effort normal. Cardiovascular system: S1 & S2 heard, RRR. No JVD, murmurs, rubs, gallops or clicks. No pedal edema. Gastrointestinal system: Abdomen is nondistended, soft and  nontender. No organomegaly or masses felt. Normal bowel sounds heard. Central nervous system: Alert and oriented to self, place, time. No focal neurological deficits. Extremities: Symmetric 5 x 5 power. Skin: No rashes, lesions or ulcers Psychiatry: Judgement and insight appear normal. Mood & affect appropriate.     The results of significant diagnostics from this hospitalization (including imaging, microbiology, ancillary and laboratory) are listed below for reference.     Microbiology: Recent Results (from the past 240 hour(s))  Culture, Urine     Status: None   Collection Time: 02/26/16 12:55 PM  Result Value Ref Range Status   Specimen Description URINE, RANDOM  Final   Special Requests NONE  Final   Culture NO GROWTH  Final   Report Status 02/27/2016 FINAL  Final     Labs: BNP (last 3 results) No results for input(s): BNP in the last 8760 hours. Basic Metabolic Panel:  Recent Labs Lab 02/26/16 0838 02/26/16 0857 02/26/16 1530 02/27/16 0609 02/28/16 0936 02/29/16 0554  NA 135 136  --  137 141 140  K 4.6 4.4  --  5.1 5.5* 5.6*  CL 101 100*  --  104 112* 112*  CO2 23  --   --  24 21* 21*  GLUCOSE 228* 218*  --  98 200* 94  BUN 58* 55*  --  47* 34* 27*  CREATININE 2.79* 2.80*  --  2.19* 1.77* 1.42*  CALCIUM 9.6  --   --  9.3 9.3 9.3  MG  --   --  2.7*  --   --   --    Liver Function Tests:  Recent Labs Lab 02/26/16 0838  AST 29  ALT 18  ALKPHOS 63  BILITOT 0.4  PROT 7.0  ALBUMIN 4.1   No results for input(s): LIPASE, AMYLASE in the last 168 hours. No results for input(s): AMMONIA in the last 168 hours. CBC:  Recent Labs Lab 02/26/16 0838 02/26/16 0857 02/27/16 0609 02/28/16 0936 02/29/16 0554  WBC 8.0  --  7.9 6.8 6.9  NEUTROABS 5.1  --   --  4.3 4.2  HGB 13.1 14.3 12.9 12.3 12.5  HCT 39.4 42.0 39.3 38.2 38.5  MCV 96.1  --  96.8 98.5 97.2  PLT 264  --  272 234 255   Cardiac Enzymes: No results for input(s): CKTOTAL, CKMB, CKMBINDEX,  TROPONINI in the last 168 hours. BNP: Invalid input(s): POCBNP CBG:  Recent Labs Lab 02/28/16 1159 02/28/16 1629 02/28/16 2204 02/29/16 0619 02/29/16 1132  GLUCAP 143* 155* 114* 102* 144*   D-Dimer No results for input(s): DDIMER in the last 72 hours. Hgb A1c  Recent Labs  02/26/16 1319  HGBA1C 6.6*   Lipid Profile No results for input(s): CHOL, HDL, LDLCALC, TRIG, CHOLHDL, LDLDIRECT in the last 72 hours. Thyroid function studies No results for input(s): TSH, T4TOTAL, T3FREE, THYROIDAB in the last 72 hours.  Invalid input(s): FREET3 Anemia work up No results for input(s): VITAMINB12, FOLATE, FERRITIN, TIBC, IRON, RETICCTPCT in the last 72 hours. Urinalysis    Component Value Date/Time   COLORURINE YELLOW 02/26/2016 1255   APPEARANCEUR CLOUDY (A) 02/26/2016 1255   LABSPEC 1.010 02/26/2016 1255   PHURINE 5.5 02/26/2016 1255   GLUCOSEU NEGATIVE 02/26/2016 1255   HGBUR NEGATIVE 02/26/2016 1255   HGBUR negative 01/01/2010 1353   BILIRUBINUR NEGATIVE 02/26/2016 1255   BILIRUBINUR n 05/30/2015 1542   KETONESUR NEGATIVE 02/26/2016 1255   PROTEINUR NEGATIVE 02/26/2016 1255   UROBILINOGEN 0.2 05/30/2015 1542   UROBILINOGEN 1.0 11/03/2014 1442   NITRITE NEGATIVE 02/26/2016 1255   LEUKOCYTESUR NEGATIVE 02/26/2016 1255   Sepsis Labs Invalid input(s): PROCALCITONIN,  WBC,  LACTICIDVEN Microbiology Recent Results (from the past 240 hour(s))  Culture, Urine     Status: None   Collection Time: 02/26/16 12:55 PM  Result Value Ref Range Status   Specimen Description URINE, RANDOM  Final   Special Requests NONE  Final   Culture NO GROWTH  Final   Report Status 02/27/2016 FINAL  Final     Time coordinating discharge: Over 30 minutes  SIGNED:   Dessa Phi, DO [facility] Pager [phone]  If 7PM-7AM, please contact night-coverage [email or web address] Password [facility] 02/29/2016, 11:41 AM

## 2016-03-03 ENCOUNTER — Other Ambulatory Visit (INDEPENDENT_AMBULATORY_CARE_PROVIDER_SITE_OTHER): Payer: Medicare Other

## 2016-03-03 DIAGNOSIS — N179 Acute kidney failure, unspecified: Secondary | ICD-10-CM | POA: Diagnosis not present

## 2016-03-04 LAB — BASIC METABOLIC PANEL
BUN: 27 mg/dL — AB (ref 6–23)
CO2: 21 mEq/L (ref 19–32)
Calcium: 9.3 mg/dL (ref 8.4–10.5)
Chloride: 108 mEq/L (ref 96–112)
Creatinine, Ser: 1.52 mg/dL — ABNORMAL HIGH (ref 0.40–1.20)
GFR: 35.14 mL/min — AB (ref >60.00)
Glucose, Bld: 101 mg/dL — ABNORMAL HIGH (ref 70–99)
POTASSIUM: 4.5 meq/L (ref 3.5–5.1)
SODIUM: 136 meq/L (ref 135–145)

## 2016-03-04 LAB — MAGNESIUM: MAGNESIUM: 1.7 mg/dL (ref 1.5–2.5)

## 2016-03-05 ENCOUNTER — Ambulatory Visit (INDEPENDENT_AMBULATORY_CARE_PROVIDER_SITE_OTHER): Payer: Medicare Other | Admitting: Family Medicine

## 2016-03-05 VITALS — BP 126/76 | HR 93 | Temp 97.9°F | Ht 65.0 in | Wt 187.0 lb

## 2016-03-05 DIAGNOSIS — N179 Acute kidney failure, unspecified: Secondary | ICD-10-CM | POA: Diagnosis not present

## 2016-03-05 DIAGNOSIS — I429 Cardiomyopathy, unspecified: Secondary | ICD-10-CM

## 2016-03-05 DIAGNOSIS — I10 Essential (primary) hypertension: Secondary | ICD-10-CM

## 2016-03-05 DIAGNOSIS — E119 Type 2 diabetes mellitus without complications: Secondary | ICD-10-CM | POA: Diagnosis not present

## 2016-03-05 DIAGNOSIS — J439 Emphysema, unspecified: Secondary | ICD-10-CM

## 2016-03-05 NOTE — Progress Notes (Signed)
Late Entry Addendum for Initial Evaluation    02/27/16 1620  PT Time Calculation  PT Start Time (ACUTE ONLY) 1452  PT Stop Time (ACUTE ONLY) 1529  PT Time Calculation (min) (ACUTE ONLY) 37 min  PT G-Codes **NOT FOR INPATIENT CLASS**  Functional Assessment Tool Used clinical judgement  Functional Limitation Mobility: Walking and moving around  Mobility: Walking and Moving Around Current Status [id number]) CI  Mobility: Walking and Moving Around Goal Status [id number]) CI  Mobility: Walking and Moving Around Discharge Status [id number]) CI  PT General Charges  $$ ACUTE PT VISIT 1 Procedure  PT Evaluation  $PT Eval Low Complexity 1 Procedure  PT Treatments  $Gait Training 8-22 mins   03/05/2016  Donnella Sham, PT [phone]  (pager)

## 2016-03-05 NOTE — Progress Notes (Signed)
Pre visit review using our clinic review tool, if applicable. No additional management support is needed unless otherwise documented below in the visit note. 

## 2016-03-06 ENCOUNTER — Telehealth: Payer: Self-pay | Admitting: *Deleted

## 2016-03-06 ENCOUNTER — Encounter: Payer: Self-pay | Admitting: Family Medicine

## 2016-03-06 ENCOUNTER — Telehealth: Payer: Self-pay | Admitting: Family Medicine

## 2016-03-06 ENCOUNTER — Telehealth: Payer: Self-pay | Admitting: Cardiovascular Disease

## 2016-03-06 NOTE — Telephone Encounter (Signed)
She is cleared for cataract surgery  Peter Martinique MD, [facility]

## 2016-03-06 NOTE — Telephone Encounter (Signed)
Pt needs a note fax to [facility] fax # [phone]  that is ok to have cataract surgery today. Pt is sch for 1 pm

## 2016-03-06 NOTE — Telephone Encounter (Signed)
Phone rings w/o answer. Left msg for patient to call.

## 2016-03-06 NOTE — Telephone Encounter (Signed)
Pt said please call her.

## 2016-03-06 NOTE — Telephone Encounter (Signed)
Pt of Dr. Sallyanne Kuster  Called back, and I have informed patient I see no documentation that a clearance request was sent. Pt aware that I will need to send a stat request to physician to advise for surgery today.  She is currently holding Plavix per surgeon instructions. Currently on hold with [facility] to get more information.  Surgery scheduled for 1pm today  Request for surgical clearance:  1. What type of surgery is being performed? Cataract removal  2. When is this surgery scheduled? Today 1pm  3. Are there any medications that need to be held prior to surgery and how long? Plavix - Already held  4. Name of physician performing surgery? Dr. Brooke Dare  5. What is your office phone and fax number?  Phone [phone] Fax [phone]

## 2016-03-06 NOTE — Telephone Encounter (Signed)
I faxed note to below number, per Dr. Sarajane Jews pt is cleared for surgery.

## 2016-03-06 NOTE — Telephone Encounter (Signed)
New message ° ° ° °Pt verbalized that she is returning call for rn  °

## 2016-03-06 NOTE — Progress Notes (Signed)
   Subjective:    Patient ID: Sydney Phillips, female    DOB: October 13, 1935, 80 y.o.   MRN: [id number]  HPI Here to follow up a hospital stay from 02-26-16 to 02-29-16 for dehydration which led to a rise in serum potassium and magnesium, as well as an acute renal failure. Presumably her medications played a role in this. No source of infection was found. Her electrolytes and fluids were corrected and she recoverable quickly. Her Lisinopril and Lasix were stopped, and she has remained off these meds since going home . She feels back to normal now. Appetite is good. Her BP at home has been stable.    Review of Systems  Constitutional: Negative.   Respiratory: Negative.   Cardiovascular: Negative.   Gastrointestinal: Negative.   Genitourinary: Negative.   Neurological: Negative.        Objective:   Physical Exam  Constitutional: She is oriented to person, place, and time. She appears well-developed and well-nourished.  Neck: No thyromegaly present.  Cardiovascular: Normal rate, regular rhythm, normal heart sounds and intact distal pulses.   Pulmonary/Chest: Effort normal and breath sounds normal.  Abdominal: Soft. Bowel sounds are normal. She exhibits no distension and no mass. There is no tenderness. There is no rebound and no guarding.  Musculoskeletal: She exhibits no edema.  Lymphadenopathy:    She has no cervical adenopathy.  Neurological: She is alert and oriented to person, place, and time.          Assessment & Plan:  She has recovered from a bout of dehydration and her BP is stable off Lisinopril and Lasix. There are no signs of edema today. We will check a BMET and a magnesium level.  Laurey Morale, MD

## 2016-03-06 NOTE — Telephone Encounter (Signed)
New message       Calling to check on status on cataract clearance.  Pt is due to have it today at 1pm.  Dr Brooke Dare faxed clearance to [place] days ago.  Please fax clearance to [phone] or call them at [phone].  If there is a problem, please call pt to let her know

## 2016-03-06 NOTE — Telephone Encounter (Signed)
You put some work into that one.Marland KitchenMarland Kitchen

## 2016-03-06 NOTE — Telephone Encounter (Signed)
Pt of Dr. Sallyanne Kuster Regarding clearance for cataract surgery.  Patient informs me that she presented to [facility] & was told she wasn't able to get the surgery today due to the office never receiving a fax from [place]. Note that I faxed this at 11am and had prior to this communicated with office staff making them aware we were coordinating the clearance last minute.  Patient was very upset about this situation.  I voiced my own aggrievement about situation to patient, she is aware that i faxed clearance notification promptly after Dr. Doug Sou review this AM. Informed her I would follow up w [facility] and see what went wrong.  Called surgery office & asked for information. Spoke to La Conner at front desk who was not able to connect me to Dr. Colan Neptune nurse. Informed her we did not receive a notification of need for surgery from the office.  Informed her also that the patient had been instructed w/o input from cardiologist to hold medications. Reiterated concern that we were only aware of the clearance notification because the patient herself brought it to our attention - we were never contacted by Dr. Colan Neptune surgical scheduler.  I received a call back from the office manager Levada Dy this afternoon, who explained that surgical scheduler had been back in touch with patient  and that the surgery was being rescheduled.  Levada Dy and I discussed the process issues and communication issues that had arisen with this situation. she notes the patient had been given instruction prior to today's date to seek clearance from cardiology due to PM & other history. Pt had gotten medical clearance from Dr. Sarajane Jews, her PCP, but still needed clearance from [place]. Levada Dy aware we didn't receive call from their office and I noted in the future this would be preferred. Aware of my concern regarding the patient's hold of plavix without cardiology recommendation - she acknowledged concern. states their preprocedure  instructions don't require interruption of plavix for cataract surgery (minimal bleed risk). Unsure why patient held her plavix for the procedure.  She notes the fax number given to me was incorrect. Gave me a fax number of [phone] to send paperwork. I will refax the office notes pertinent to situation. Levada Dy aware to look out for this and call me if unreceived. Will route to Dr. Sallyanne Kuster so he is aware of situation.

## 2016-03-06 NOTE — Telephone Encounter (Signed)
I have notified patient and surgeon's office of Dr. Doug Sou clearance as well as sent faxed notification. They are aware to call if further information required.

## 2016-03-12 ENCOUNTER — Telehealth: Payer: Self-pay | Admitting: *Deleted

## 2016-03-12 NOTE — Telephone Encounter (Signed)
Fax notification/request for clearance received from [facility] Note that this has already been handled, but the original request was placed in medical records workqueue to scan for archive.  See encounter note from last week.  The patient requested a clearance for cataract removal from [place] last week and reports she was instructed by eye surgeon to hold plavix prior to procedure. This is reinforced by the faxed cardiac clearance request which advises the patient to hold plavix per Dr. Colan Neptune instruction. Note that the patient complied with the recommendations by eye surgeon to hold medication. I noted in reviewing the request, there was a discrepancy in instructions given to patient by surgery center. When I had spoken to Levada Dy, Glass blower/designer for surgeon's office last week, they informed me they did NOT advise the patient to hold any medications for the procedure.  Have discussed w Dr. Sallyanne Kuster and he is aware of situation. This is an Pharmacist, hospital for records only.

## 2016-03-31 ENCOUNTER — Encounter: Payer: Self-pay | Admitting: Family

## 2016-04-03 ENCOUNTER — Ambulatory Visit (INDEPENDENT_AMBULATORY_CARE_PROVIDER_SITE_OTHER)
Admission: RE | Admit: 2016-04-03 | Discharge: 2016-04-03 | Disposition: A | Discharge status: Home / Self Care | Payer: Medicare Other | Source: Ambulatory Visit | Attending: Vascular Surgery | Admitting: Vascular Surgery

## 2016-04-03 ENCOUNTER — Ambulatory Visit (INDEPENDENT_AMBULATORY_CARE_PROVIDER_SITE_OTHER): Payer: Medicare Other | Admitting: Family

## 2016-04-03 ENCOUNTER — Encounter: Payer: Self-pay | Admitting: Family

## 2016-04-03 VITALS — BP 140/84 | HR 95 | Temp 97.0°F | Resp 16 | Ht 65.0 in | Wt 186.0 lb

## 2016-04-03 DIAGNOSIS — I5043 Acute on chronic combined systolic (congestive) and diastolic (congestive) heart failure: Secondary | ICD-10-CM | POA: Diagnosis not present

## 2016-04-03 DIAGNOSIS — J449 Chronic obstructive pulmonary disease, unspecified: Secondary | ICD-10-CM | POA: Diagnosis not present

## 2016-04-03 DIAGNOSIS — I255 Ischemic cardiomyopathy: Secondary | ICD-10-CM | POA: Diagnosis not present

## 2016-04-03 DIAGNOSIS — Z7902 Long term (current) use of antithrombotics/antiplatelets: Secondary | ICD-10-CM | POA: Diagnosis not present

## 2016-04-03 DIAGNOSIS — E860 Dehydration: Secondary | ICD-10-CM | POA: Diagnosis not present

## 2016-04-03 DIAGNOSIS — N179 Acute kidney failure, unspecified: Secondary | ICD-10-CM | POA: Diagnosis not present

## 2016-04-03 DIAGNOSIS — I272 Pulmonary hypertension, unspecified: Secondary | ICD-10-CM | POA: Diagnosis not present

## 2016-04-03 DIAGNOSIS — J189 Pneumonia, unspecified organism: Secondary | ICD-10-CM | POA: Diagnosis not present

## 2016-04-03 DIAGNOSIS — I739 Peripheral vascular disease, unspecified: Secondary | ICD-10-CM

## 2016-04-03 DIAGNOSIS — I6521 Occlusion and stenosis of right carotid artery: Secondary | ICD-10-CM | POA: Insufficient documentation

## 2016-04-03 DIAGNOSIS — R938 Abnormal findings on diagnostic imaging of other specified body structures: Secondary | ICD-10-CM | POA: Insufficient documentation

## 2016-04-03 DIAGNOSIS — J441 Chronic obstructive pulmonary disease with (acute) exacerbation: Secondary | ICD-10-CM | POA: Diagnosis not present

## 2016-04-03 DIAGNOSIS — Z95828 Presence of other vascular implants and grafts: Secondary | ICD-10-CM | POA: Diagnosis not present

## 2016-04-03 DIAGNOSIS — F17200 Nicotine dependence, unspecified, uncomplicated: Secondary | ICD-10-CM

## 2016-04-03 DIAGNOSIS — Z95810 Presence of automatic (implantable) cardiac defibrillator: Secondary | ICD-10-CM | POA: Diagnosis not present

## 2016-04-03 DIAGNOSIS — I447 Left bundle-branch block, unspecified: Secondary | ICD-10-CM | POA: Diagnosis not present

## 2016-04-03 DIAGNOSIS — R0602 Shortness of breath: Secondary | ICD-10-CM | POA: Diagnosis not present

## 2016-04-03 DIAGNOSIS — E1151 Type 2 diabetes mellitus with diabetic peripheral angiopathy without gangrene: Secondary | ICD-10-CM | POA: Diagnosis not present

## 2016-04-03 DIAGNOSIS — E785 Hyperlipidemia, unspecified: Secondary | ICD-10-CM | POA: Diagnosis not present

## 2016-04-03 DIAGNOSIS — J9621 Acute and chronic respiratory failure with hypoxia: Secondary | ICD-10-CM | POA: Diagnosis not present

## 2016-04-03 DIAGNOSIS — K5909 Other constipation: Secondary | ICD-10-CM | POA: Diagnosis not present

## 2016-04-03 DIAGNOSIS — I6523 Occlusion and stenosis of bilateral carotid arteries: Secondary | ICD-10-CM

## 2016-04-03 DIAGNOSIS — G8929 Other chronic pain: Secondary | ICD-10-CM | POA: Diagnosis not present

## 2016-04-03 DIAGNOSIS — M797 Fibromyalgia: Secondary | ICD-10-CM | POA: Diagnosis not present

## 2016-04-03 DIAGNOSIS — M549 Dorsalgia, unspecified: Secondary | ICD-10-CM | POA: Diagnosis not present

## 2016-04-03 DIAGNOSIS — I110 Hypertensive heart disease with heart failure: Secondary | ICD-10-CM | POA: Diagnosis not present

## 2016-04-03 DIAGNOSIS — J440 Chronic obstructive pulmonary disease with acute lower respiratory infection: Secondary | ICD-10-CM | POA: Diagnosis not present

## 2016-04-03 DIAGNOSIS — I428 Other cardiomyopathies: Secondary | ICD-10-CM | POA: Diagnosis not present

## 2016-04-03 DIAGNOSIS — I2510 Atherosclerotic heart disease of native coronary artery without angina pectoris: Secondary | ICD-10-CM | POA: Diagnosis not present

## 2016-04-03 DIAGNOSIS — Z9889 Other specified postprocedural states: Secondary | ICD-10-CM

## 2016-04-03 DIAGNOSIS — K219 Gastro-esophageal reflux disease without esophagitis: Secondary | ICD-10-CM | POA: Diagnosis not present

## 2016-04-03 DIAGNOSIS — I509 Heart failure, unspecified: Secondary | ICD-10-CM | POA: Diagnosis not present

## 2016-04-03 DIAGNOSIS — I779 Disorder of arteries and arterioles, unspecified: Secondary | ICD-10-CM | POA: Diagnosis not present

## 2016-04-03 LAB — VAS US CAROTID
LCCADDIAS: 20 cm/s
LCCADSYS: 139 cm/s
LCCAPSYS: 87 cm/s
LEFT ECA DIAS: -16 cm/s
LICAPDIAS: -26 cm/s
LICAPSYS: -101 cm/s
Left ICA dist dias: -43 cm/s
Left ICA dist sys: -112 cm/s
RCCADSYS: -76 cm/s
RCCAPSYS: 94 cm/s
RIGHT CCA MID DIAS: 15 cm/s
RIGHT ECA DIAS: 32 cm/s
Right CCA prox dias: 17 cm/s

## 2016-04-03 NOTE — Patient Instructions (Signed)
Stroke Prevention Some medical conditions and behaviors are associated with an increased chance of having a stroke. You may prevent a stroke by making healthy choices and managing medical conditions. HOW CAN I REDUCE MY RISK OF HAVING A STROKE?   Stay physically active. Get at least 30 minutes of activity on most or all days.  Do not smoke. It may also be helpful to avoid exposure to secondhand smoke.  Limit alcohol use. Moderate alcohol use is considered to be:  No more than 2 drinks per day for men.  No more than 1 drink per day for nonpregnant women.  Eat healthy foods. This involves:  Eating 5 or more servings of fruits and vegetables a day.  Making dietary changes that address high blood pressure (hypertension), high cholesterol, diabetes, or obesity.  Manage your cholesterol levels.  Making food choices that are high in fiber and low in saturated fat, trans fat, and cholesterol may control cholesterol levels.  Take any prescribed medicines to control cholesterol as directed by your health care provider.  Manage your diabetes.  Controlling your carbohydrate and sugar intake is recommended to manage diabetes.  Take any prescribed medicines to control diabetes as directed by your health care provider.  Control your hypertension.  Making food choices that are low in salt (sodium), saturated fat, trans fat, and cholesterol is recommended to manage hypertension.  Ask your health care provider if you need treatment to lower your blood pressure. Take any prescribed medicines to control hypertension as directed by your health care provider.  If you are 18-39 years of age, have your blood pressure checked every 3-5 years. If you are 40 years of age or older, have your blood pressure checked every year.  Maintain a healthy weight.  Reducing calorie intake and making food choices that are low in sodium, saturated fat, trans fat, and cholesterol are recommended to manage  weight.  Stop drug abuse.  Avoid taking birth control pills.  Talk to your health care provider about the risks of taking birth control pills if you are over 35 years old, smoke, get migraines, or have ever had a blood clot.  Get evaluated for sleep disorders (sleep apnea).  Talk to your health care provider about getting a sleep evaluation if you snore a lot or have excessive sleepiness.  Take medicines only as directed by your health care provider.  For some people, aspirin or blood thinners (anticoagulants) are helpful in reducing the risk of forming abnormal blood clots that can lead to stroke. If you have the irregular heart rhythm of atrial fibrillation, you should be on a blood thinner unless there is a good reason you cannot take them.  Understand all your medicine instructions.  Make sure that other conditions (such as anemia or atherosclerosis) are addressed. SEEK IMMEDIATE MEDICAL CARE IF:   You have sudden weakness or numbness of the face, arm, or leg, especially on one side of the body.  Your face or eyelid droops to one side.  You have sudden confusion.  You have trouble speaking (aphasia) or understanding.  You have sudden trouble seeing in one or both eyes.  You have sudden trouble walking.  You have dizziness.  You have a loss of balance or coordination.  You have a sudden, severe headache with no known cause.  You have new chest pain or an irregular heartbeat. Any of these symptoms may represent a serious problem that is an emergency. Do not wait to see if the symptoms will   go away. Get medical help at once. Call your local emergency services (911 in [place] Do not drive yourself to the hospital.   This information is not intended to replace advice given to you by your health care provider. Make sure you discuss any questions you have with your health care provider.   Document Released: 07/10/2004 Document Revised: 06/23/2014 Document Reviewed:  12/03/2012 Elsevier Interactive Patient Education 2016 [employer]    Peripheral Vascular Disease Peripheral vascular disease (PVD) is a disease of the blood vessels that are not part of your heart and brain. A simple term for PVD is poor circulation. In most cases, PVD narrows the blood vessels that carry blood from your heart to the rest of your body. This can result in a decreased supply of blood to your arms, legs, and internal organs, like your stomach or kidneys. However, it most often affects a person's lower legs and feet. There are two types of PVD.  Organic PVD. This is the more common type. It is caused by damage to the structure of blood vessels.  Functional PVD. This is caused by conditions that make blood vessels contract and tighten (spasm). Without treatment, PVD tends to get worse over time. PVD can also lead to acute ischemic limb. This is when an arm or limb suddenly has trouble getting enough blood. This is a medical emergency. CAUSES Each type of PVD has many different causes. The most common cause of PVD is buildup of a fatty material (plaque) inside of your arteries (atherosclerosis). Small amounts of plaque can break off from the walls of the blood vessels and become lodged in a smaller artery. This blocks blood flow and can cause acute ischemic limb. Other common causes of PVD include:  Blood clots that form inside of blood vessels.  Injuries to blood vessels.  Diseases that cause inflammation of blood vessels or cause blood vessel spasms.  Health behaviors and health history that increase your risk of developing PVD. RISK FACTORS  You may have a greater risk of PVD if you:  Have a family history of PVD.  Have certain medical conditions, including:  High cholesterol.  Diabetes.  High blood pressure (hypertension).  Coronary heart disease.  Past problems with blood clots.  Past injury, such as burns or a broken bone. These may have damaged blood  vessels in your limbs.  Buerger disease. This is caused by inflamed blood vessels in your hands and feet.  Some forms of arthritis.  Rare birth defects that affect the arteries in your legs.  Use tobacco.  Do not get enough exercise.  Are obese.  Are age 50 or older. SIGNS AND SYMPTOMS  PVD may cause many different symptoms. Your symptoms depend on what part of your body is not getting enough blood. Some common signs and symptoms include:  Cramps in your lower legs. This may be a symptom of poor leg circulation (claudication).  Pain and weakness in your legs while you are physically active that goes away when you rest (intermittent claudication).  Leg pain when at rest.  Leg numbness, tingling, or weakness.  Coldness in a leg or foot, especially when compared with the other leg.  Skin or hair changes. These can include:  Hair loss.  Shiny skin.  Pale or bluish skin.  Thick toenails.  Inability to get or maintain an erection (erectile dysfunction). People with PVD are more prone to developing ulcers and sores on their toes, feet, or legs. These may take longer than   normal to heal. DIAGNOSIS Your health care provider may diagnose PVD from your signs and symptoms. The health care provider will also do a physical exam. You may have tests to find out what is causing your PVD and determine its severity. Tests may include:  Blood pressure recordings from your arms and legs and measurements of the strength of your pulses (pulse volume recordings).  Imaging studies using sound waves to take pictures of the blood flow through your blood vessels (Doppler ultrasound).  Injecting a dye into your blood vessels before having imaging studies using:  X-rays (angiogram or arteriogram).  Computer-generated X-rays (CT angiogram).  A powerful electromagnetic field and a computer (magnetic resonance angiogram or MRA). TREATMENT Treatment for PVD depends on the cause of your condition  and the severity of your symptoms. It also depends on your age. Underlying causes need to be treated and controlled. These include long-lasting (chronic) conditions, such as diabetes, high cholesterol, and high blood pressure. You may need to first try making lifestyle changes and taking medicines. Surgery may be needed if these do not work. Lifestyle changes may include:  Quitting smoking.  Exercising regularly.  Following a low-fat, low-cholesterol diet. Medicines may include:  Blood thinners to prevent blood clots.  Medicines to improve blood flow.  Medicines to improve your blood cholesterol levels. Surgical procedures may include:  A procedure that uses an inflated balloon to open a blocked artery and improve blood flow (angioplasty).  A procedure to put in a tube (stent) to keep a blocked artery open (stent implant).  Surgery to reroute blood flow around a blocked artery (peripheral bypass surgery).  Surgery to remove dead tissue from an infected wound on the affected limb.  Amputation. This is surgical removal of the affected limb. This may be necessary in cases of acute ischemic limb that are not improved through medical or surgical treatments. HOME CARE INSTRUCTIONS  Take medicines only as directed by your health care provider.  Do not use any tobacco products, including cigarettes, chewing tobacco, or electronic cigarettes. If you need help quitting, ask your health care provider.  Lose weight if you are overweight, and maintain a healthy weight as directed by your health care provider.  Eat a diet that is low in fat and cholesterol. If you need help, ask your health care provider.  Exercise regularly. Ask your health care provider to suggest some good activities for you.  Use compression stockings or other mechanical devices as directed by your health care provider.  Take good care of your feet.  Wear comfortable shoes that fit well.  Check your feet often for  any cuts or sores. SEEK MEDICAL CARE IF:  You have cramps in your legs while walking.  You have leg pain when you are at rest.  You have coldness in a leg or foot.  Your skin changes.  You have erectile dysfunction.  You have cuts or sores on your feet that are not healing. SEEK IMMEDIATE MEDICAL CARE IF:  Your arm or leg turns cold and blue.  Your arms or legs become red, warm, swollen, painful, or numb.  You have chest pain or trouble breathing.  You suddenly have weakness in your face, arm, or leg.  You become very confused or lose the ability to speak.  You suddenly have a very bad headache or lose your vision.   This information is not intended to replace advice given to you by your health care provider. Make sure you discuss any questions   you have with your health care provider.   Document Released: 07/10/2004 Document Revised: 06/23/2014 Document Reviewed: 11/10/2013 Elsevier Interactive Patient Education 2016 [employer]     Steps to Quit Smoking  Smoking tobacco can be harmful to your health and can affect almost every organ in your body. Smoking puts you, and those around you, at risk for developing many serious chronic diseases. Quitting smoking is difficult, but it is one of the best things that you can do for your health. It is never too late to quit. WHAT ARE THE BENEFITS OF QUITTING SMOKING? When you quit smoking, you lower your risk of developing serious diseases and conditions, such as:  Lung cancer or lung disease, such as COPD.  Heart disease.  Stroke.  Heart attack.  Infertility.  Osteoporosis and bone fractures. Additionally, symptoms such as coughing, wheezing, and shortness of breath may get better when you quit. You may also find that you get sick less often because your body is stronger at fighting off colds and infections. If you are pregnant, quitting smoking can help to reduce your chances of having a baby of low birth weight. HOW DO  I GET READY TO QUIT? When you decide to quit smoking, create a plan to make sure that you are successful. Before you quit:  Pick a date to quit. Set a date within the next two weeks to give you time to prepare.  Write down the reasons why you are quitting. Keep this list in places where you will see it often, such as on your bathroom mirror or in your car or wallet.  Identify the people, places, things, and activities that make you want to smoke (triggers) and avoid them. Make sure to take these actions:  Throw away all cigarettes at home, at work, and in your car.  Throw away smoking accessories, such as ashtrays and lighters.  Clean your car and make sure to empty the ashtray.  Clean your home, including curtains and carpets.  Tell your family, friends, and coworkers that you are quitting. Support from your loved ones can make quitting easier.  Talk with your health care provider about your options for quitting smoking.  Find out what treatment options are covered by your health insurance. WHAT STRATEGIES CAN I USE TO QUIT SMOKING?  Talk with your healthcare provider about different strategies to quit smoking. Some strategies include:  Quitting smoking altogether instead of gradually lessening how much you smoke over a period of time. Research shows that quitting "cold turkey" is more successful than gradually quitting.  Attending in-person counseling to help you build problem-solving skills. You are more likely to have success in quitting if you attend several counseling sessions. Even short sessions of 10 minutes can be effective.  Finding resources and support systems that can help you to quit smoking and remain smoke-free after you quit. These resources are most helpful when you use them often. They can include:  Online chats with a counselor.  Telephone quitlines.  Printed self-help materials.  Support groups or group counseling.  Text messaging programs.  Mobile phone  applications.  Taking medicines to help you quit smoking. (If you are pregnant or breastfeeding, talk with your health care provider first.) Some medicines contain nicotine and some do not. Both types of medicines help with cravings, but the medicines that include nicotine help to relieve withdrawal symptoms. Your health care provider may recommend:  Nicotine patches, gum, or lozenges.  Nicotine inhalers or sprays.  Non-nicotine medicine that   is taken by mouth. Talk with your health care provider about combining strategies, such as taking medicines while you are also receiving in-person counseling. Using these two strategies together makes you more likely to succeed in quitting than if you used either strategy on its own. If you are pregnant or breastfeeding, talk with your health care provider about finding counseling or other support strategies to quit smoking. Do not take medicine to help you quit smoking unless told to do so by your health care provider. WHAT THINGS CAN I DO TO MAKE IT EASIER TO QUIT? Quitting smoking might feel overwhelming at first, but there is a lot that you can do to make it easier. Take these important actions:  Reach out to your family and friends and ask that they support and encourage you during this time. Call telephone quitlines, reach out to support groups, or work with a counselor for support.  Ask people who smoke to avoid smoking around you.  Avoid places that trigger you to smoke, such as bars, parties, or smoke-break areas at work.  Spend time around people who do not smoke.  Lessen stress in your life, because stress can be a smoking trigger for some people. To lessen stress, try:  Exercising regularly.  Deep-breathing exercises.  Yoga.  Meditating.  Performing a body scan. This involves closing your eyes, scanning your body from head to toe, and noticing which parts of your body are particularly tense. Purposefully relax the muscles in those  areas.  Download or purchase mobile phone or tablet apps (applications) that can help you stick to your quit plan by providing reminders, tips, and encouragement. There are many free apps, such as QuitGuide from the CDC (Centers for Disease Control and Prevention). You can find other support for quitting smoking (smoking cessation) through smokefree.gov and other websites. HOW WILL I FEEL WHEN I QUIT SMOKING? Within the first 24 hours of quitting smoking, you may start to feel some withdrawal symptoms. These symptoms are usually most noticeable 2-3 days after quitting, but they usually do not last beyond 2-3 weeks. Changes or symptoms that you might experience include:  Mood swings.  Restlessness, anxiety, or irritation.  Difficulty concentrating.  Dizziness.  Strong cravings for sugary foods in addition to nicotine.  Mild weight gain.  Constipation.  Nausea.  Coughing or a sore throat.  Changes in how your medicines work in your body.  A depressed mood.  Difficulty sleeping (insomnia). After the first 2-3 weeks of quitting, you may start to notice more positive results, such as:  Improved sense of smell and taste.  Decreased coughing and sore throat.  Slower heart rate.  Lower blood pressure.  Clearer skin.  The ability to breathe more easily.  Fewer sick days. Quitting smoking is very challenging for most people. Do not get discouraged if you are not successful the first time. Some people need to make many attempts to quit before they achieve long-term success. Do your best to stick to your quit plan, and talk with your health care provider if you have any questions or concerns.   This information is not intended to replace advice given to you by your health care provider. Make sure you discuss any questions you have with your health care provider.   Document Released: 05/27/2001 Document Revised: 10/17/2014 Document Reviewed: 10/17/2014 Elsevier Interactive Patient  Education 2016 [employer]  

## 2016-04-03 NOTE — Progress Notes (Signed)
[facility] [place] HISTORY AND PHYSICAL   MRN : [id number]  History of Present Illness:   Sydney Phillips is a 80 y.o. female patient of Dr. Oneida Alar who is s/p Left CEA on 11/08/14. She is also s/p left femoral to tibioperoneal trunk bypass in 2015 with vein.  She's also had a previous left subclavian stent by Dr. Gwenlyn Found in 2013. She denies any numbness tingling or exertional fatigue in her left arm.  She was last evaluated by Dr. Oneida Alar on 03/29/15. At that time her left CEA and left leg bypass graft were widely patent. Left subclavian stent in 2013 with either high-grade stenosis or presumably occluded. Asymptomatic She was to continue to take her Plavix. She was to follow-up with [place] in 6 months time for repeat carotid duplex and a duplex of her bypass graft as well as bilateral ABIs. She was to let us know if she developed any symptoms in her left arm if she does then we would consider further imaging of this.  She returns today for follow up.  She had a recent tooth extraction. She uses a walker to help her be steady on her feet. She reports extensive arthritis.  She states she was hospitalized in September 2017 due to confusion, states kidney issues found; lisinopril was stopped at that time. Sh reports twisting her left ankle at that time and states she is walking less since this.  She denies any history of stroke or TIA.  She denies non healing wounds.   Pt Diabetic: Yes, A1C in September 2017 was 6.6 Pt smoker: smoker  (3-4 cigarettes/day, smoking since age 27 yrs)  Pt meds include: Statin :No, had cramping from rosuvastatin Betablocker: Yes ASA: No Other anticoagulants/antiplatelets: Plavix was stopped by her cardiologist and PCP, per pt.    Current Outpatient Prescriptions  Medication Sig Dispense Refill  . ACCU-CHEK FASTCLIX LANCETS MISC CHECK BLOOD SUGAR ONCE DAILY 102 each 1  . ACCU-CHEK SMARTVIEW test strip CHECK BLOOD SUGAR ONCE DAILY 100 each  2  . albuterol (PROVENTIL HFA;VENTOLIN HFA) 108 (90 Base) MCG/ACT inhaler Inhale 2 puffs into the lungs every 4 (four) hours as needed for wheezing or shortness of breath. 1 Inhaler 5  . allopurinol (ZYLOPRIM) 300 MG tablet Take 1 tablet (300 mg total) by mouth daily. 30 tablet 11  . ALPRAZolam (XANAX) 1 MG tablet TAKE ONE TABLET BY MOUTH EVERY 6 HOURS AS NEEDED FOR ANXIETY 120 tablet 5  . clopidogrel (PLAVIX) 75 MG tablet Take 1 tablet (75 mg total) by mouth daily. 90 tablet 3  . DULoxetine (CYMBALTA) 60 MG capsule TAKE ONE CAPSULE BY MOUTH TWICE DAILY 60 capsule 11  . HYDROcodone-acetaminophen (NORCO) 5-325 MG tablet Take 1 tablet by mouth every 6 (six) hours as needed for moderate pain. 120 tablet 0  . ketotifen (ZADITOR) 0.025 % ophthalmic solution Place 1 drop into both eyes 2 (two) times daily.    . metFORMIN (GLUCOPHAGE) 500 MG tablet Take 1 tablet (500 mg total) by mouth 2 (two) times daily with a meal. 180 tablet 3  . metoprolol tartrate (LOPRESSOR) 25 MG tablet Take 1 tablet (25 mg total) by mouth daily. 90 tablet 3  . nitroGLYCERIN (NITROSTAT) 0.4 MG SL tablet Place 1 tablet (0.4 mg total) under the tongue every 5 (five) minutes x 3 doses as needed for chest pain. 30 tablet 11  . Triamcinolone Acetonide (NASACORT AQ NA) Place into the nose daily. Over the counter     No current  facility-administered medications for this visit.     Past Medical History:  Diagnosis Date  . Arthritis   . Automatic implantable cardioverter-defibrillator in situ   . Bronchitis   . CAD (coronary artery disease)    Currently angina free, no evidence of reversible ischemia  . Chronic back pain   . Chronic constipation   . Colon polyps 2003.  2015.   HP polyps 2003.  adnomas 2015.  required referal to [facility] for colonoscopic resection of flat polyps.   . Congestive heart failure (CHF) (HCC)    New York Heart Association functional class 2, diastolic dysfunction  . COPD (chronic obstructive pulmonary  disease) ([place])   . Depression with anxiety    takes Cymbalta daily  . Diabetes mellitus without complication (HCC)    no meds  . Early cataracts, bilateral   . Fibromyalgia   . GERD (gastroesophageal reflux disease)    was on meds but was taken off;now watches what she eats  . Hemorrhoids   . History of kidney stones   . Hx of colonic polyps   . Hyperlipemia    takes Crestor daily  . Hypertension    takes Amlodipine and Metoprolol daily  . Insomnia   . LBBB (left bundle branch block)    Stress test 09/03/2010, EF 55  . Myocardial infarction   . PAD (peripheral artery disease) (HCC)    Carotid, subclavian, and lower extremity beds, currently not symptomatic  . Presence of combination internal cardiac defibrillator (ICD) and pacemaker   . Presence of permanent cardiac pacemaker   . S/P angioplasty with stent, lt. subclavian 07/31/11 08/01/2011  . Shortness of breath    when over exerting self per pt.  . Subclavian arterial stenosis, lt, with PTA/STENT 07/31/11 08/01/2011  . Syncope 07/28/2011   EF - 50-55, moderate concentric hypertrophy in left ventricle  . Urinary incontinence   . Vertigo    takes Meclizine prn    Social History Social History  Substance Use Topics  . Smoking status: Current Every Day Smoker    Packs/day: 1.00    Years: 62.00    Types: Cigarettes  . Smokeless tobacco: Never Used     Comment: Down to 3-4 cigarettes per day  . Alcohol use No    Family History Family History  Problem Relation Age of Onset  . CAD Father   . Heart disease Father   . Hyperlipidemia Father   . Heart disease Mother   . Deep vein thrombosis Son   . Hyperlipidemia    . Colon cancer Maternal Grandmother   . Cancer Sister     ovarian  . Diabetes Sister   . Heart disease Sister   . Anesthesia problems Neg Hx   . Hypotension Neg Hx   . Malignant hyperthermia Neg Hx   . Pseudochol deficiency Neg Hx     Surgical History Past Surgical History:  Procedure Laterality Date   . ABDOMINAL AORTAGRAM N/A 08/12/2013   Procedure: ABDOMINAL Maxcine Ham;  Surgeon: Elam Dutch, MD;  Location[facility] CATH LAB;  Service: Cardiovascular;  Laterality: N/A;  . ABDOMINAL HYSTERECTOMY    . APPENDECTOMY    . BACK SURGERY  2012  . BIV ICD GENERTAOR CHANGE OUT Left 02/20/2012   Procedure: BIV ICD GENERTAOR CHANGE OUT;  Surgeon: [name], MD;  Location: [facility] CATH LAB;  Service: Cardiovascular;  Laterality: Left;  . CARDIAC CATHETERIZATION  12/01/2007   By Dr. Melvern Banker, left heart cath,   . CARDIAC DEFIBRILLATOR PLACEMENT  05/2008   By Dr Blanch Media, [employer] CANNOT HAVE MRI's  . CAROTID ANGIOGRAM N/A 07/31/2011   Procedure: CAROTID ANGIOGRAM;  Surgeon: Lorretta Harp, MD;  Location: [facility] CATH LAB;  Service: Cardiovascular;  Laterality: N/A;  carotid angiogram and possible Lt SCA PTA  . COLONOSCOPY W/ POLYPECTOMY  12/2013  . CORONARY ANGIOPLASTY    . ENDARTERECTOMY Left 11/08/2014   Procedure: LEFT CAROTID ENDARTERECTOMY WITH HEMASHIELD PATCH ANGIOPLASTY;  Surgeon: Elam Dutch, MD;  Location: [place];  Service: Vascular;  Laterality: Left;  . ESOPHAGOGASTRODUODENOSCOPY N/A 01/20/2014   Procedure: ESOPHAGOGASTRODUODENOSCOPY (EGD);  Surgeon: Jerene Bears, MD;  Location[facility] ENDOSCOPY;  Service: Endoscopy;  Laterality: N/A;  . FEMORAL-POPLITEAL BYPASS GRAFT Right 10/12/2013   Procedure:   Femoral-Peroneal trunk  bypass with nonreversed greater saphenous vein graft;  Surgeon: Elam Dutch, MD;  Location: [place];  Service: Vascular;  Laterality: Right;  . GIVENS CAPSULE STUDY N/A 01/20/2014   Procedure: GIVENS CAPSULE STUDY;  Surgeon: Jerene Bears, MD;  Location: [place];  Service: Gastroenterology;  Laterality: N/A;  . INTRAOPERATIVE ARTERIOGRAM Right 10/12/2013   Procedure: INTRA OPERATIVE ARTERIOGRAM;  Surgeon: Elam Dutch, MD;  Location: [place];  Service: Vascular;  Laterality: Right;  . ORIF ELBOW FRACTURE  08/16/2011   Procedure: OPEN REDUCTION INTERNAL FIXATION (ORIF)  ELBOW/OLECRANON FRACTURE;  Surgeon: Schuyler Amor, MD;  Location: [place];  Service: Orthopedics;  Laterality: Left;  . RENAL ANGIOGRAM N/A 08/12/2013   Procedure: RENAL ANGIOGRAM;  Surgeon: Elam Dutch, MD;  Location: [facility] CATH LAB;  Service: Cardiovascular;  Laterality: N/A;  . SUBCLAVIAN STENT PLACEMENT Left 07/31/2011   7x18 Genesis, balloon, with reduction of 90% ostial left subclavian artery stenosis to 0% with residual excellent flow  . TONSILLECTOMY    . TUBAL LIGATION      Allergies  Allergen Reactions  . Codeine Itching  . Darvon Itching  . Meloxicam Other (See Comments)    Unknown    . Norco [Hydrocodone-Acetaminophen]     itching  . Potassium-Containing Compounds Other (See Comments)    Causes severe constipation  . Propoxyphene N-Acetaminophen Itching  . Rofecoxib Other (See Comments)    Unknown    . Rosuvastatin Other (See Comments)    cramps  . Statins Itching and Other (See Comments)    Sleeplessness  . Azithromycin Rash  . Erythromycin Rash  . Penicillins Rash  . Sulfa Antibiotics Rash    Current Outpatient Prescriptions  Medication Sig Dispense Refill  . ACCU-CHEK FASTCLIX LANCETS MISC CHECK BLOOD SUGAR ONCE DAILY 102 each 1  . ACCU-CHEK SMARTVIEW test strip CHECK BLOOD SUGAR ONCE DAILY 100 each 2  . albuterol (PROVENTIL HFA;VENTOLIN HFA) 108 (90 Base) MCG/ACT inhaler Inhale 2 puffs into the lungs every 4 (four) hours as needed for wheezing or shortness of breath. 1 Inhaler 5  . allopurinol (ZYLOPRIM) 300 MG tablet Take 1 tablet (300 mg total) by mouth daily. 30 tablet 11  . ALPRAZolam (XANAX) 1 MG tablet TAKE ONE TABLET BY MOUTH EVERY 6 HOURS AS NEEDED FOR ANXIETY 120 tablet 5  . clopidogrel (PLAVIX) 75 MG tablet Take 1 tablet (75 mg total) by mouth daily. 90 tablet 3  . DULoxetine (CYMBALTA) 60 MG capsule TAKE ONE CAPSULE BY MOUTH TWICE DAILY 60 capsule 11  . HYDROcodone-acetaminophen (NORCO) 5-325 MG tablet Take 1 tablet by mouth every 6 (six) hours  as needed for moderate pain. 120 tablet 0  . ketotifen (ZADITOR) 0.025 % ophthalmic solution Place 1 drop  into both eyes 2 (two) times daily.    . metFORMIN (GLUCOPHAGE) 500 MG tablet Take 1 tablet (500 mg total) by mouth 2 (two) times daily with a meal. 180 tablet 3  . metoprolol tartrate (LOPRESSOR) 25 MG tablet Take 1 tablet (25 mg total) by mouth daily. 90 tablet 3  . nitroGLYCERIN (NITROSTAT) 0.4 MG SL tablet Place 1 tablet (0.4 mg total) under the tongue every 5 (five) minutes x 3 doses as needed for chest pain. 30 tablet 11  . Triamcinolone Acetonide (NASACORT AQ NA) Place into the nose daily. Over the counter     No current facility-administered medications for this visit.      REVIEW OF SYSTEMS: See HPI for pertinent positives and negatives.  Physical Examination Vitals:   04/03/16 1456  BP: 140/84  Pulse: 95  Resp: 16  Temp: 97 F (36.1 C)  TempSrc: Oral  SpO2: 92%  Weight: 186 lb (84.4 kg)  Height: 5\' 5"  (1.651 m)   Body mass index is 30.95 kg/m.  General:  WDWN in obese female in NAD Gait: Normal HENT: WNL Eyes: Pupils equal Pulmonary: normal non-labored breathing, fair air movement, CTAB, no rales, rhonchi, or wheezing Cardiac: RRR, no murmur detected. ICD left upper chest, subcutaneous.   Abdomen: soft, NT, no masses palpated Skin: no rashes, no ulcers, no cellulitis.   VASCULAR EXAM  Carotid Bruits Right Left   Negative Positive       Aorta is not palpable Right radial pulse is 2+ palpable, left is 1-2+ palpable.                       VASCULAR EXAM: Extremities without ischemic changes, without Gangrene; without open wounds.                                                                                                          LE Pulses Right Left       FEMORAL  1+ palpable  1+ palpable        POPLITEAL  1+ palpable   not palpable       POSTERIOR TIBIAL  not palpable   not palpable        DORSALIS PEDIS      ANTERIOR TIBIAL 1+ palpable  1+  palpable     Musculoskeletal: no muscle wasting or atrophy; no peripheral edema  Neurologic: A&O X 3; Appropriate Affect ;  SENSATION: normal; MOTOR FUNCTION: 5/5 Symmetric, CN 2-12 intact except has significant hearing loss, Speech is fluent/normal    ASSESSMENT:  Sydney Phillips is a 80 y.o. female who is s/p Left CEA on 11/08/14. She is also s/p left femoral to tibioperoneal trunk bypass in 2015 with vein.  She's also had a previous left subclavian stent by Dr. Gwenlyn Found in 2013. She denies any numbness tingling or exertional fatigue in her left arm.   She denies any history of stroke or TIA. She does not seem to walk enough to elicit claudication symptoms; likely due to extensive arthritis and recent mild ankle injury.  Her pedal pulses are palpable.  Her atherosclerotic risk factors include smoking since age 57 to present, controlled DM, CAD, statin intolerance, and COPD.   DATA Today's carotid duplex suggests 40-59% stenosis of the right internal carotid artery. Right external carotid artery stenosis. Left CEA site with no restenosis.  Left retrograde vertebral and monophasic subclavian waveform consistent with proximal stenosis/occlusion.  Increase in the right internal carotid artery stenosis compared to the previous exam on 03/29/15.  PLAN:   The patient was counseled re smoking cessation and given several free resources re smoking cessation. Daily seated leg exercises demonstrated and discussed.   Based on today's exam and non-invasive vascular lab results, the patient will follow up in 1 year with the following tests: ABI's and carotid duplex. I discussed in depth with the patient the nature of atherosclerosis, and emphasized the importance of maximal medical management including strict control of blood pressure, blood glucose, and lipid levels, obtaining regular exercise, and cessation of smoking.  The patient is aware that without maximal medical management the  underlying atherosclerotic disease process will progress, limiting the benefit of any interventions.  The patient was given information about stroke prevention and what symptoms should prompt the patient to seek immediate medical care.  The patient was given information about PAD including signs, symptoms, treatment, what symptoms should prompt the patient to seek immediate medical care, and risk reduction measures to take. Thank you for allowing [place] to participate in this patient's care.  Clemon Amend, RN, MSN, FNP-C [facility] Office: [phone]  Clinic MD: Oneida Alar 04/03/2016 3:20 PM

## 2016-04-06 ENCOUNTER — Encounter (HOSPITAL_COMMUNITY): Payer: Self-pay

## 2016-04-06 ENCOUNTER — Emergency Department (HOSPITAL_COMMUNITY): Payer: Medicare Other

## 2016-04-06 ENCOUNTER — Inpatient Hospital Stay (HOSPITAL_COMMUNITY)
Admission: EM | Admit: 2016-04-06 | Discharge: 2016-04-11 | DRG: 291 | Disposition: A | Discharge status: Home / Self Care | Payer: Medicare Other | Attending: Internal Medicine | Admitting: Internal Medicine

## 2016-04-06 DIAGNOSIS — M797 Fibromyalgia: Secondary | ICD-10-CM | POA: Diagnosis present

## 2016-04-06 DIAGNOSIS — E1151 Type 2 diabetes mellitus with diabetic peripheral angiopathy without gangrene: Secondary | ICD-10-CM | POA: Diagnosis present

## 2016-04-06 DIAGNOSIS — J441 Chronic obstructive pulmonary disease with (acute) exacerbation: Secondary | ICD-10-CM | POA: Diagnosis present

## 2016-04-06 DIAGNOSIS — I428 Other cardiomyopathies: Secondary | ICD-10-CM | POA: Diagnosis present

## 2016-04-06 DIAGNOSIS — I252 Old myocardial infarction: Secondary | ICD-10-CM

## 2016-04-06 DIAGNOSIS — J90 Pleural effusion, not elsewhere classified: Secondary | ICD-10-CM | POA: Diagnosis not present

## 2016-04-06 DIAGNOSIS — J438 Other emphysema: Secondary | ICD-10-CM | POA: Diagnosis not present

## 2016-04-06 DIAGNOSIS — G4700 Insomnia, unspecified: Secondary | ICD-10-CM | POA: Diagnosis present

## 2016-04-06 DIAGNOSIS — E785 Hyperlipidemia, unspecified: Secondary | ICD-10-CM | POA: Diagnosis present

## 2016-04-06 DIAGNOSIS — N179 Acute kidney failure, unspecified: Secondary | ICD-10-CM | POA: Diagnosis present

## 2016-04-06 DIAGNOSIS — I509 Heart failure, unspecified: Secondary | ICD-10-CM | POA: Diagnosis not present

## 2016-04-06 DIAGNOSIS — K5909 Other constipation: Secondary | ICD-10-CM | POA: Diagnosis present

## 2016-04-06 DIAGNOSIS — M549 Dorsalgia, unspecified: Secondary | ICD-10-CM | POA: Diagnosis present

## 2016-04-06 DIAGNOSIS — K219 Gastro-esophageal reflux disease without esophagitis: Secondary | ICD-10-CM | POA: Diagnosis present

## 2016-04-06 DIAGNOSIS — I255 Ischemic cardiomyopathy: Secondary | ICD-10-CM | POA: Diagnosis not present

## 2016-04-06 DIAGNOSIS — Y95 Nosocomial condition: Secondary | ICD-10-CM | POA: Diagnosis present

## 2016-04-06 DIAGNOSIS — E860 Dehydration: Secondary | ICD-10-CM | POA: Diagnosis present

## 2016-04-06 DIAGNOSIS — J9621 Acute and chronic respiratory failure with hypoxia: Secondary | ICD-10-CM | POA: Diagnosis present

## 2016-04-06 DIAGNOSIS — F418 Other specified anxiety disorders: Secondary | ICD-10-CM | POA: Diagnosis present

## 2016-04-06 DIAGNOSIS — Z800 Family history of malignant neoplasm of digestive organs: Secondary | ICD-10-CM

## 2016-04-06 DIAGNOSIS — Z66 Do not resuscitate: Secondary | ICD-10-CM | POA: Diagnosis present

## 2016-04-06 DIAGNOSIS — R0603 Acute respiratory distress: Secondary | ICD-10-CM | POA: Diagnosis not present

## 2016-04-06 DIAGNOSIS — I447 Left bundle-branch block, unspecified: Secondary | ICD-10-CM | POA: Diagnosis present

## 2016-04-06 DIAGNOSIS — Z8249 Family history of ischemic heart disease and other diseases of the circulatory system: Secondary | ICD-10-CM

## 2016-04-06 DIAGNOSIS — R0602 Shortness of breath: Secondary | ICD-10-CM | POA: Diagnosis not present

## 2016-04-06 DIAGNOSIS — J449 Chronic obstructive pulmonary disease, unspecified: Secondary | ICD-10-CM | POA: Diagnosis present

## 2016-04-06 DIAGNOSIS — Z7902 Long term (current) use of antithrombotics/antiplatelets: Secondary | ICD-10-CM

## 2016-04-06 DIAGNOSIS — I110 Hypertensive heart disease with heart failure: Secondary | ICD-10-CM | POA: Diagnosis not present

## 2016-04-06 DIAGNOSIS — Z9861 Coronary angioplasty status: Secondary | ICD-10-CM

## 2016-04-06 DIAGNOSIS — J189 Pneumonia, unspecified organism: Secondary | ICD-10-CM | POA: Diagnosis present

## 2016-04-06 DIAGNOSIS — F17210 Nicotine dependence, cigarettes, uncomplicated: Secondary | ICD-10-CM | POA: Diagnosis present

## 2016-04-06 DIAGNOSIS — G8929 Other chronic pain: Secondary | ICD-10-CM | POA: Diagnosis present

## 2016-04-06 DIAGNOSIS — I272 Pulmonary hypertension, unspecified: Secondary | ICD-10-CM | POA: Diagnosis present

## 2016-04-06 DIAGNOSIS — Z833 Family history of diabetes mellitus: Secondary | ICD-10-CM

## 2016-04-06 DIAGNOSIS — I2510 Atherosclerotic heart disease of native coronary artery without angina pectoris: Secondary | ICD-10-CM | POA: Diagnosis present

## 2016-04-06 DIAGNOSIS — F419 Anxiety disorder, unspecified: Secondary | ICD-10-CM | POA: Diagnosis present

## 2016-04-06 DIAGNOSIS — Z95810 Presence of automatic (implantable) cardiac defibrillator: Secondary | ICD-10-CM | POA: Diagnosis not present

## 2016-04-06 DIAGNOSIS — I5043 Acute on chronic combined systolic (congestive) and diastolic (congestive) heart failure: Secondary | ICD-10-CM | POA: Diagnosis present

## 2016-04-06 DIAGNOSIS — M1A9XX0 Chronic gout, unspecified, without tophus (tophi): Secondary | ICD-10-CM | POA: Diagnosis present

## 2016-04-06 DIAGNOSIS — J440 Chronic obstructive pulmonary disease with acute lower respiratory infection: Secondary | ICD-10-CM | POA: Diagnosis present

## 2016-04-06 DIAGNOSIS — I10 Essential (primary) hypertension: Secondary | ICD-10-CM | POA: Diagnosis present

## 2016-04-06 LAB — CBC WITH DIFFERENTIAL/PLATELET
BASOS ABS: 0 10*3/uL (ref 0.0–0.1)
BASOS PCT: 0 %
EOS ABS: 0.3 10*3/uL (ref 0.0–0.7)
EOS PCT: 3 %
HCT: 39.8 % (ref 36.0–46.0)
Hemoglobin: 13.5 g/dL (ref 12.0–15.0)
Lymphocytes Relative: 33 %
Lymphs Abs: 4 10*3/uL (ref 0.7–4.0)
MCH: 32.7 pg (ref 26.0–34.0)
MCHC: 33.9 g/dL (ref 30.0–36.0)
MCV: 96.4 fL (ref 78.0–100.0)
Monocytes Absolute: 1 10*3/uL (ref 0.1–1.0)
Monocytes Relative: 8 %
Neutro Abs: 6.9 10*3/uL (ref 1.7–7.7)
Neutrophils Relative %: 56 %
PLATELETS: 312 10*3/uL (ref 150–400)
RBC: 4.13 MIL/uL (ref 3.87–5.11)
RDW: 14.7 % (ref 11.5–15.5)
WBC: 12.3 10*3/uL — AB (ref 4.0–10.5)

## 2016-04-06 LAB — I-STAT ARTERIAL BLOOD GAS, ED
ACID-BASE DEFICIT: 6 mmol/L — AB (ref 0.0–2.0)
Bicarbonate: 19.5 mmol/L — ABNORMAL LOW (ref 20.0–28.0)
O2 SAT: 96 %
PH ART: 7.336 — AB (ref 7.350–7.450)
PO2 ART: 87 mmHg (ref 83.0–108.0)
Patient temperature: 98.6
TCO2: 21 mmol/L (ref 0–100)
pCO2 arterial: 36.5 mmHg (ref 32.0–48.0)

## 2016-04-06 LAB — BASIC METABOLIC PANEL
ANION GAP: 7 (ref 5–15)
BUN: 9 mg/dL (ref 6–20)
CALCIUM: 9 mg/dL (ref 8.9–10.3)
CO2: 22 mmol/L (ref 22–32)
Chloride: 112 mmol/L — ABNORMAL HIGH (ref 101–111)
Creatinine, Ser: 1.08 mg/dL — ABNORMAL HIGH (ref 0.44–1.00)
GFR calc Af Amer: 55 mL/min — ABNORMAL LOW (ref >60)
GFR, EST NON AFRICAN AMERICAN: 48 mL/min — AB (ref >60)
Glucose, Bld: 127 mg/dL — ABNORMAL HIGH (ref 65–99)
POTASSIUM: 3.8 mmol/L (ref 3.5–5.1)
SODIUM: 141 mmol/L (ref 135–145)

## 2016-04-06 LAB — GLUCOSE, CAPILLARY
Glucose-Capillary: 333 mg/dL — ABNORMAL HIGH (ref 65–99)
Glucose-Capillary: 267 mg/dL — ABNORMAL HIGH (ref 65–99)

## 2016-04-06 LAB — MRSA PCR SCREENING: MRSA BY PCR: NEGATIVE

## 2016-04-06 LAB — I-STAT TROPONIN, ED: TROPONIN I, POC: 0.01 ng/mL (ref 0.00–0.08)

## 2016-04-06 LAB — TROPONIN I

## 2016-04-06 LAB — BRAIN NATRIURETIC PEPTIDE: B NATRIURETIC PEPTIDE 5: 308.6 pg/mL — AB (ref 0.0–100.0)

## 2016-04-06 MED ORDER — KETOTIFEN FUMARATE 0.025 % OP SOLN
1.0000 [drp] | Freq: Two times a day (BID) | OPHTHALMIC | Status: DC
  Administered 2016-04-06 – 2016-04-11 (×8): 1 [drp] via OPHTHALMIC
  Filled 2016-04-06: qty 5

## 2016-04-06 MED ORDER — INSULIN ASPART 100 UNIT/ML ~~LOC~~ SOLN
0.0000 [IU] | Freq: Three times a day (TID) | SUBCUTANEOUS | Status: DC
  Administered 2016-04-06: 7 [IU] via SUBCUTANEOUS
  Administered 2016-04-07: 1 [IU] via SUBCUTANEOUS
  Administered 2016-04-07 – 2016-04-09 (×6): 2 [IU] via SUBCUTANEOUS
  Administered 2016-04-09 – 2016-04-10 (×3): 3 [IU] via SUBCUTANEOUS
  Administered 2016-04-10 – 2016-04-11 (×2): 2 [IU] via SUBCUTANEOUS

## 2016-04-06 MED ORDER — FUROSEMIDE 10 MG/ML IJ SOLN
40.0000 mg | INTRAMUSCULAR | Status: AC
  Administered 2016-04-06: 40 mg via INTRAVENOUS

## 2016-04-06 MED ORDER — DULOXETINE HCL 60 MG PO CPEP
60.0000 mg | ORAL_CAPSULE | Freq: Two times a day (BID) | ORAL | Status: DC
  Administered 2016-04-06 – 2016-04-11 (×10): 60 mg via ORAL
  Filled 2016-04-06 (×10): qty 1

## 2016-04-06 MED ORDER — SODIUM CHLORIDE 0.9 % IV SOLN: 250.0000 mL | INTRAVENOUS | Status: DC | PRN

## 2016-04-06 MED ORDER — SODIUM CHLORIDE 0.9% FLUSH
3.0000 mL | INTRAVENOUS | Status: DC | PRN
  Administered 2016-04-07 – 2016-04-10 (×2): 3 mL via INTRAVENOUS
  Filled 2016-04-06 (×2): qty 3

## 2016-04-06 MED ORDER — ALLOPURINOL 300 MG PO TABS
300.0000 mg | ORAL_TABLET | Freq: Every day | ORAL | Status: DC
  Administered 2016-04-06 – 2016-04-11 (×6): 300 mg via ORAL
  Filled 2016-04-06 (×6): qty 1

## 2016-04-06 MED ORDER — CLOPIDOGREL BISULFATE 75 MG PO TABS
75.0000 mg | ORAL_TABLET | Freq: Every day | ORAL | Status: DC
  Administered 2016-04-07 – 2016-04-11 (×5): 75 mg via ORAL
  Filled 2016-04-06 (×5): qty 1

## 2016-04-06 MED ORDER — METOPROLOL TARTRATE 25 MG PO TABS
25.0000 mg | ORAL_TABLET | Freq: Every day | ORAL | Status: DC
  Administered 2016-04-06 – 2016-04-11 (×6): 25 mg via ORAL
  Filled 2016-04-06 (×6): qty 1

## 2016-04-06 MED ORDER — FUROSEMIDE 10 MG/ML IJ SOLN
40.0000 mg | Freq: Two times a day (BID) | INTRAMUSCULAR | Status: DC
  Administered 2016-04-06 – 2016-04-07 (×2): 40 mg via INTRAVENOUS
  Filled 2016-04-06 (×3): qty 4

## 2016-04-06 MED ORDER — SODIUM CHLORIDE 0.9% FLUSH
3.0000 mL | Freq: Two times a day (BID) | INTRAVENOUS | Status: DC
  Administered 2016-04-06 – 2016-04-11 (×8): 3 mL via INTRAVENOUS

## 2016-04-06 MED ORDER — LEVOFLOXACIN IN D5W 500 MG/100ML IV SOLN
500.0000 mg | INTRAVENOUS | Status: DC
  Administered 2016-04-06 – 2016-04-07 (×2): 500 mg via INTRAVENOUS
  Filled 2016-04-06 (×2): qty 100

## 2016-04-06 MED ORDER — HYDROCODONE-ACETAMINOPHEN 5-325 MG PO TABS
1.0000 | ORAL_TABLET | Freq: Four times a day (QID) | ORAL | Status: DC | PRN
Start: 2016-04-06 — End: 2016-04-11
  Administered 2016-04-07 – 2016-04-08 (×2): 1 via ORAL
  Filled 2016-04-06 (×2): qty 1

## 2016-04-06 MED ORDER — FLUTICASONE PROPIONATE 50 MCG/ACT NA SUSP
1.0000 | Freq: Every day | NASAL | Status: DC
  Administered 2016-04-08: 1 via NASAL
  Filled 2016-04-06 (×2): qty 16

## 2016-04-06 MED ORDER — ALBUTEROL (5 MG/ML) CONTINUOUS INHALATION SOLN
10.0000 mg/h | INHALATION_SOLUTION | RESPIRATORY_TRACT | Status: DC
  Administered 2016-04-06: 10 mg/h via RESPIRATORY_TRACT
  Filled 2016-04-06: qty 20

## 2016-04-06 MED ORDER — ALBUTEROL SULFATE (2.5 MG/3ML) 0.083% IN NEBU
2.5000 mg | INHALATION_SOLUTION | RESPIRATORY_TRACT | Status: DC
  Administered 2016-04-06 – 2016-04-07 (×7): 2.5 mg via RESPIRATORY_TRACT
  Filled 2016-04-06 (×7): qty 3

## 2016-04-06 MED ORDER — ALPRAZOLAM 0.5 MG PO TABS
0.5000 mg | ORAL_TABLET | Freq: Three times a day (TID) | ORAL | Status: DC | PRN
  Administered 2016-04-07 – 2016-04-11 (×9): 0.5 mg via ORAL
  Filled 2016-04-06 (×9): qty 1

## 2016-04-06 MED ORDER — ONDANSETRON HCL 4 MG/2ML IJ SOLN: 4.0000 mg | Freq: Four times a day (QID) | INTRAMUSCULAR | Status: DC | PRN

## 2016-04-06 MED ORDER — METHYLPREDNISOLONE SODIUM SUCC 40 MG IJ SOLR
40.0000 mg | Freq: Four times a day (QID) | INTRAMUSCULAR | Status: DC
  Administered 2016-04-06 – 2016-04-10 (×15): 40 mg via INTRAVENOUS
  Filled 2016-04-06 (×17): qty 1

## 2016-04-06 MED ORDER — ACETAMINOPHEN 325 MG PO TABS: 650.0000 mg | ORAL_TABLET | ORAL | Status: DC | PRN

## 2016-04-06 NOTE — ED Triage Notes (Signed)
Called EMS for SOB with low sats in the 70's as per EMS, 2 albuterol tx given one with atrovent 125mg  Solumedrol wheezing in uppers now and Rhonchi in lowers as per EMS

## 2016-04-06 NOTE — ED Notes (Signed)
PA at bedside.

## 2016-04-06 NOTE — ED Provider Notes (Signed)
[facility] DEPT Provider Note   CSN: [id number] Arrival date & time: 04/06/16  1138     History   Chief Complaint Chief Complaint  Patient presents with  . Shortness of Breath    woke up could not catch her breath     HPI Sydney Phillips is a 80 y.o. female.  Sydney Phillips is a 80 y.o. Female with a history of COPD, CHF and CAD who presents to the ED via EMS complaining of worsening SOB since she woke up this morning. She reports wheezing and chest tightness, but denies chest pain. She reports history of CHF, but recently was told to discontinue her Lasix about 1 month ago. She reports she's been compliant with her COPD medications. Patient received 5 mg albuterol treatment and 125 mg of Solu-Medrol by EMS. She reports she has been using her inhalers. Patient denies fevers, chest pain, palpitations, leg swelling, vomiting, diarrhea, abdominal pain, lightheadedness, dizziness, syncope or rashes.   The history is provided by the patient. No language interpreter was used.  Shortness of Breath  Associated symptoms include cough and wheezing. Pertinent negatives include no fever, no headaches, no sore throat, no neck pain, no chest pain, no vomiting, no abdominal pain, no rash and no leg swelling.    Past Medical History:  Diagnosis Date  . Arthritis   . Automatic implantable cardioverter-defibrillator in situ   . Bronchitis   . CAD (coronary artery disease)    Currently angina free, no evidence of reversible ischemia  . Chronic back pain   . Chronic constipation   . Colon polyps 2003.  2015.   HP polyps 2003.  adnomas 2015.  required referal to [facility] for colonoscopic resection of flat polyps.   . Congestive heart failure (CHF) (HCC)    New York Heart Association functional class 2, diastolic dysfunction  . COPD (chronic obstructive pulmonary disease) ([place])   . Depression with anxiety    takes Cymbalta daily  . Diabetes mellitus without complication (HCC)    no  meds  . Early cataracts, bilateral   . Fibromyalgia   . GERD (gastroesophageal reflux disease)    was on meds but was taken off;now watches what she eats  . Hemorrhoids   . History of kidney stones   . Hx of colonic polyps   . Hyperlipemia    takes Crestor daily  . Hypertension    takes Amlodipine and Metoprolol daily  . Insomnia   . LBBB (left bundle branch block)    Stress test 09/03/2010, EF 55  . Myocardial infarction   . PAD (peripheral artery disease) (HCC)    Carotid, subclavian, and lower extremity beds, currently not symptomatic  . Presence of combination internal cardiac defibrillator (ICD) and pacemaker   . Presence of permanent cardiac pacemaker   . S/P angioplasty with stent, lt. subclavian 07/31/11 08/01/2011  . Shortness of breath    when over exerting self per pt.  . Subclavian arterial stenosis, lt, with PTA/STENT 07/31/11 08/01/2011  . Syncope 07/28/2011   EF - 50-55, moderate concentric hypertrophy in left ventricle  . Urinary incontinence   . Vertigo    takes Meclizine prn    Patient Active Problem List   Diagnosis Date Noted  . CHF exacerbation ([place]) 04/06/2016  . Acute respiratory distress 04/06/2016  . Altered mental state 02/26/2016  . Syncopal episodes 02/26/2016  . Acute kidney injury ([place]) 02/26/2016  . Skin lesion of back/upper right shoulder 02/26/2016  . Cardiomyopathy ([place]) 11/07/2015  .  Smoking 11/07/2015  . Arthralgia 10/12/2015  . Left carotid stenosis 11/08/2014  . Diabetes mellitus without complication ([place]) [id number]  . NSTEMI (non-ST elevated myocardial infarction) ([place]) 01/20/2014  . Melena 01/20/2014  . Microcytic anemia 01/19/2014  . Chest pain 01/19/2014  . PAD (peripheral artery disease) ([place]) 10/12/2013  . S/P angioplasty with stent, lt. subclavian 07/31/11 08/01/2011  . Biventricular implantable cardioverter-defibrillator in situ 07/28/2011  . PVD, known severe, (previuosly asymptomatic) LSCA disease, now with "high grade" RICA  disease. 07/28/2011  . Bronchitis, recent flare 07/28/2011  . Syncope,possible related to subclavian steal syndrome 07/27/2011  . Vertigo 07/27/2011  . Hypokalemia 07/27/2011  . SPINAL STENOSIS 01/28/2010  . CONSTIPATION 01/14/2010  . BACK PAIN, LUMBAR 01/01/2010  . INSOMNIA 01/23/2009  . ALLERGIC RHINITIS 11/10/2008  . Coronary atherosclerosis 06/21/2008  . HIP PAIN, BILATERAL 06/21/2008  . Myalgia and myositis 11/26/2007  . HLD (hyperlipidemia) 07/28/2007  . ACUTE SINUSITIS, UNSPECIFIED 07/28/2007  . WEIGHT GAIN 06/04/2007  . DYSPNEA 06/04/2007  . COPD (chronic obstructive pulmonary disease) with emphysema ([place]) 03/02/2007  . Depression 02/25/2007  . Essential hypertension 02/25/2007  . GERD 02/25/2007  . COLONIC POLYPS, HX OF 02/25/2007    Past Surgical History:  Procedure Laterality Date  . ABDOMINAL AORTAGRAM N/A 08/12/2013   Procedure: ABDOMINAL Maxcine Ham;  Surgeon: Elam Dutch, MD;  Location: [facility] CATH LAB;  Service: Cardiovascular;  Laterality: N/A;  . ABDOMINAL HYSTERECTOMY    . APPENDECTOMY    . BACK SURGERY  2012  . BIV ICD GENERTAOR CHANGE OUT Left 02/20/2012   Procedure: BIV ICD GENERTAOR CHANGE OUT;  Surgeon: [name], MD;  Location: [facility] CATH LAB;  Service: Cardiovascular;  Laterality: Left;  . CARDIAC CATHETERIZATION  12/01/2007   By Dr. Melvern Banker, left heart cath,   . CARDIAC DEFIBRILLATOR PLACEMENT  05/2008   By Dr Blanch Media, [employer] CANNOT HAVE MRI's  . CAROTID ANGIOGRAM N/A 07/31/2011   Procedure: CAROTID ANGIOGRAM;  Surgeon: Lorretta Harp, MD;  Location: [facility] CATH LAB;  Service: Cardiovascular;  Laterality: N/A;  carotid angiogram and possible Lt SCA PTA  . COLONOSCOPY W/ POLYPECTOMY  12/2013  . CORONARY ANGIOPLASTY    . ENDARTERECTOMY Left 11/08/2014   Procedure: LEFT CAROTID ENDARTERECTOMY WITH HEMASHIELD PATCH ANGIOPLASTY;  Surgeon: Elam Dutch, MD;  Location: [place];  Service: Vascular;  Laterality: Left;  . ESOPHAGOGASTRODUODENOSCOPY N/A 01/20/2014    Procedure: ESOPHAGOGASTRODUODENOSCOPY (EGD);  Surgeon: Jerene Bears, MD;  Location[facility] ENDOSCOPY;  Service: Endoscopy;  Laterality: N/A;  . FEMORAL-POPLITEAL BYPASS GRAFT Right 10/12/2013   Procedure:   Femoral-Peroneal trunk  bypass with nonreversed greater saphenous vein graft;  Surgeon: Elam Dutch, MD;  Location: [place];  Service: Vascular;  Laterality: Right;  . GIVENS CAPSULE STUDY N/A 01/20/2014   Procedure: GIVENS CAPSULE STUDY;  Surgeon: Jerene Bears, MD;  Location: [place];  Service: Gastroenterology;  Laterality: N/A;  . INTRAOPERATIVE ARTERIOGRAM Right 10/12/2013   Procedure: INTRA OPERATIVE ARTERIOGRAM;  Surgeon: Elam Dutch, MD;  Location: [place];  Service: Vascular;  Laterality: Right;  . ORIF ELBOW FRACTURE  08/16/2011   Procedure: OPEN REDUCTION INTERNAL FIXATION (ORIF) ELBOW/OLECRANON FRACTURE;  Surgeon: Schuyler Amor, MD;  Location: [place];  Service: Orthopedics;  Laterality: Left;  . RENAL ANGIOGRAM N/A 08/12/2013   Procedure: RENAL ANGIOGRAM;  Surgeon: Elam Dutch, MD;  Location: [facility] CATH LAB;  Service: Cardiovascular;  Laterality: N/A;  . SUBCLAVIAN STENT PLACEMENT Left 07/31/2011   7x18 Genesis, balloon, with reduction of 90% ostial  left subclavian artery stenosis to 0% with residual excellent flow  . TONSILLECTOMY    . TUBAL LIGATION      OB History    No data available       Home Medications    Prior to Admission medications   Medication Sig Start Date End Date Taking? Authorizing Provider  ACCU-CHEK FASTCLIX LANCETS MISC CHECK BLOOD SUGAR ONCE DAILY 07/24/15   Laurey Morale, MD  ACCU-CHEK SMARTVIEW test strip CHECK BLOOD SUGAR ONCE DAILY 08/09/15   Laurey Morale, MD  albuterol (PROVENTIL HFA;VENTOLIN HFA) 108 (90 Base) MCG/ACT inhaler Inhale 2 puffs into the lungs every 4 (four) hours as needed for wheezing or shortness of breath. 10/12/15   Laurey Morale, MD  allopurinol (ZYLOPRIM) 300 MG tablet Take 1 tablet (300 mg total) by mouth daily. 10/15/15    Laurey Morale, MD  ALPRAZolam Duanne Moron) 1 MG tablet TAKE ONE TABLET BY MOUTH EVERY 6 HOURS AS NEEDED FOR ANXIETY 12/11/15   Laurey Morale, MD  clopidogrel (PLAVIX) 75 MG tablet Take 1 tablet (75 mg total) by mouth daily. 06/27/15   Laurey Morale, MD  DULoxetine (CYMBALTA) 60 MG capsule TAKE ONE CAPSULE BY MOUTH TWICE DAILY 02/04/16   Laurey Morale, MD  HYDROcodone-acetaminophen (NORCO) 5-325 MG tablet Take 1 tablet by mouth every 6 (six) hours as needed for moderate pain. 02/14/16   Laurey Morale, MD  ketotifen (ZADITOR) 0.025 % ophthalmic solution Place 1 drop into both eyes 2 (two) times daily.    Historical Provider, MD  metFORMIN (GLUCOPHAGE) 500 MG tablet Take 1 tablet (500 mg total) by mouth 2 (two) times daily with a meal. 05/30/15   Laurey Morale, MD  metoprolol tartrate (LOPRESSOR) 25 MG tablet Take 1 tablet (25 mg total) by mouth daily. 05/30/15   Laurey Morale, MD  nitroGLYCERIN (NITROSTAT) 0.4 MG SL tablet Place 1 tablet (0.4 mg total) under the tongue every 5 (five) minutes x 3 doses as needed for chest pain. 10/12/15   Laurey Morale, MD  Triamcinolone Acetonide (NASACORT AQ NA) Place into the nose daily. Over the counter    Historical Provider, MD    Family History Family History  Problem Relation Age of Onset  . CAD Father   . Heart disease Father   . Hyperlipidemia Father   . Heart disease Mother   . Deep vein thrombosis Son   . Hyperlipidemia    . Colon cancer Maternal Grandmother   . Cancer Sister     ovarian  . Diabetes Sister   . Heart disease Sister   . Anesthesia problems Neg Hx   . Hypotension Neg Hx   . Malignant hyperthermia Neg Hx   . Pseudochol deficiency Neg Hx     Social History Social History  Substance Use Topics  . Smoking status: Current Every Day Smoker    Packs/day: 1.00    Years: 62.00    Types: Cigarettes  . Smokeless tobacco: Never Used     Comment: Down to 3-4 cigarettes per day  . Alcohol use No     Allergies   Codeine; Darvon; Meloxicam;  Norco [hydrocodone-acetaminophen]; Potassium-containing compounds; Propoxyphene n-acetaminophen; Rofecoxib; Rosuvastatin; Statins; Azithromycin; Erythromycin; Penicillins; and Sulfa antibiotics   Review of Systems Review of Systems  Constitutional: Negative for chills and fever.  HENT: Negative for congestion and sore throat.   Eyes: Negative for visual disturbance.  Respiratory: Positive for cough, chest tightness, shortness of breath and wheezing.  Cardiovascular: Negative for chest pain, palpitations and leg swelling.  Gastrointestinal: Negative for abdominal pain, nausea and vomiting.  Genitourinary: Negative for dysuria.  Musculoskeletal: Negative for back pain and neck pain.  Skin: Negative for rash.  Neurological: Negative for headaches.     Physical Exam Updated Vital Signs BP 94/62   Pulse (!) 108   Temp 98.2 F (36.8 C) (Axillary)   Resp (!) 29   Ht 5\' 8"  (1.727 m)   Wt 83.5 kg   SpO2 94%   BMI 27.98 kg/m   Physical Exam  Constitutional: She is oriented to person, place, and time. She appears well-developed and well-nourished.  HENT:  Head: Normocephalic and atraumatic.  Mouth/Throat: Oropharynx is clear and moist.  Eyes: Conjunctivae are normal. Pupils are equal, round, and reactive to light. Right eye exhibits no discharge. Left eye exhibits no discharge.  Neck: Neck supple. No JVD present.  Cardiovascular: Normal rate, regular rhythm, normal heart sounds and intact distal pulses.   Pulmonary/Chest: She has wheezes.  Diminished bilaterally with wheezes and crackles to bilateral bases. Increased work of breathing. RR 30.   Abdominal: Soft. There is no tenderness.  Musculoskeletal: She exhibits edema.  Mild bilateral LE edema.   Lymphadenopathy:    She has no cervical adenopathy.  Neurological: She is alert and oriented to person, place, and time. Coordination normal.  Skin: Skin is warm and dry. Capillary refill takes less than 2 seconds. No rash noted. She  is not diaphoretic. No erythema. No pallor.  Psychiatric: She has a normal mood and affect. Her behavior is normal.  Nursing note and vitals reviewed.    ED Treatments / Results  Labs (all labs ordered are listed, but only abnormal results are displayed) Labs Reviewed  CBC WITH DIFFERENTIAL/PLATELET - Abnormal; Notable for the following:       Result Value   WBC 12.3 (*)    All other components within normal limits  BASIC METABOLIC PANEL - Abnormal; Notable for the following:    Chloride 112 (*)    Glucose, Bld 127 (*)    Creatinine, Ser 1.08 (*)    GFR calc non Af Amer 48 (*)    GFR calc Af Amer 55 (*)    All other components within normal limits  BRAIN NATRIURETIC PEPTIDE - Abnormal; Notable for the following:    B Natriuretic Peptide 308.6 (*)    All other components within normal limits  I-STAT ARTERIAL BLOOD GAS, ED - Abnormal; Notable for the following:    pH, Arterial 7.336 (*)    Bicarbonate 19.5 (*)    Acid-base deficit 6.0 (*)    All other components within normal limits  MRSA PCR SCREENING  BLOOD GAS, ARTERIAL  TROPONIN I  TROPONIN I  I-STAT TROPOININ, ED    EKG  EKG Interpretation  Date/Time:  Sunday April 06 2016 11:58:15 EDT Ventricular Rate:  97 PR Interval:    QRS Duration: 116 QT Interval:  411 QTC Calculation: 523 R Axis:   -37 Text Interpretation:  Sinus rhythm Ventricular premature complex Nonspecific IVCD with LAD LVH with secondary repolarization abnormality Inferior infarct, old Anterior infarct, old nonspecific T wave changes when compared to EKG from 2004 Confirmed by [facility] MD, [place] [phone]) on 04/06/2016 12:56:39 PM       Radiology Dg Chest Port 1 View  Result Date: 04/06/2016 CLINICAL DATA:  COPD and difficulty breathing EXAM: PORTABLE CHEST 1 VIEW COMPARISON:  01/19/2014 FINDINGS: Cardiac shadow is enlarged. This is a significant change  from the prior exam. Calcified granuloma in the left mid lung. A defibrillator is again seen  and stable. Vascular congestion with interstitial edema is seen. IMPRESSION: Cardiomegaly with vascular congestion and interstitial edema. The changes of prior granulomatous disease. Electronically Signed   By: Inez Catalina M.D.   On: 04/06/2016 12:34    Procedures Procedures (including critical care time)  Medications Ordered in ED Medications  albuterol (PROVENTIL,VENTOLIN) solution continuous neb (10 mg/hr Nebulization New Bag/Given 04/06/16 1200)  ketotifen (ZADITOR) 0.025 % ophthalmic solution 1 drop (not administered)  HYDROcodone-acetaminophen (NORCO/VICODIN) 5-325 MG per tablet 1 tablet (not administered)  DULoxetine (CYMBALTA) DR capsule 60 mg (not administered)  ALPRAZolam (XANAX) tablet 0.5 mg (not administered)  allopurinol (ZYLOPRIM) tablet 300 mg (not administered)  fluticasone (FLONASE) 50 MCG/ACT nasal spray 1 spray (not administered)  clopidogrel (PLAVIX) tablet 75 mg (not administered)  metoprolol tartrate (LOPRESSOR) tablet 25 mg (not administered)  sodium chloride flush (NS) 0.9 % injection 3 mL (not administered)  sodium chloride flush (NS) 0.9 % injection 3 mL (not administered)  0.9 %  sodium chloride infusion (not administered)  acetaminophen (TYLENOL) tablet 650 mg (not administered)  ondansetron (ZOFRAN) injection 4 mg (not administered)  furosemide (LASIX) injection 40 mg (not administered)  insulin aspart (novoLOG) injection 0-9 Units (not administered)  methylPREDNISolone sodium succinate (SOLU-MEDROL) 40 mg/mL injection 40 mg (not administered)  albuterol (PROVENTIL) (2.5 MG/3ML) 0.083% nebulizer solution 2.5 mg (2.5 mg Nebulization Given 04/06/16 1508)  levofloxacin (LEVAQUIN) IVPB 500 mg (500 mg Intravenous New Bag/Given 04/06/16 1511)  furosemide (LASIX) injection 40 mg (40 mg Intravenous Given 04/06/16 1500)     Initial Impression / Assessment and Plan / ED Course  I have reviewed the triage vital signs and the nursing notes.  Pertinent labs &  imaging results that were available during my care of the patient were reviewed by me and considered in my medical decision making (see chart for details).  Clinical Course   This is a 80 y.o. Female with a history of COPD, CHF and CAD who presents to the ED via EMS complaining of worsening SOB since she woke up this morning. She reports wheezing and chest tightness, but denies chest pain. She reports history of CHF, but recently was told to discontinue her Lasix about 1 month ago. She reports she's been compliant with her COPD medications. She reports she has been using her inhalers. Patient received albuterol treatment and one 25 mg of Solu-Medrol by EMS prior to arrival. On exam the patient is afebrile. She has increased work of breathing. She appears to be getting tired. She has diminished lung sounds to her bilateral bases.  She has only mild LE edema. Patient stated on Bipap on arrival due to increased work of breathing and patient appearing tired.  Troponin is not elevated. CBC shows a white count of 12,000. BMP shows a creatinine of 1.08. GFR of 48. BNP is elevated at 308. ABG shows a pH of 7.336. Bicarbonate is 36.5.  Chest x-ray shows cardiomegaly with vascular congestion and interstitial edema. At recheck patient reports she is feeling much better on Bipap. Will admit for CHF exacerbation. Patient agrees with plan for admission.  I spoke with Santiago Glad from [facility] hospitalist who accepted the patient for admission. She requested temporary orders for stepdown bed.  This patient was discussed with and evaluated by Dr. Leonette Monarch who agrees with assessment and plan.   Final Clinical Impressions(s) / ED Diagnoses   Final diagnoses:  Acute congestive heart failure,  unspecified congestive heart failure type ([place])   CRITICAL CARE Performed by: Hanley Hays   Total critical care time: 40 minutes  Critical care time was exclusive of separately billable procedures and treating other  patients.  Critical care was necessary to treat or prevent imminent or life-threatening deterioration.  Critical care was time spent personally by me on the following activities: development of treatment plan with patient and/or surrogate as well as nursing, discussions with consultants, evaluation of patient's response to treatment, examination of patient, obtaining history from patient or surrogate, ordering and performing treatments and interventions, ordering and review of laboratory studies, ordering and review of radiographic studies, pulse oximetry and re-evaluation of patient's condition.   New Prescriptions Current Discharge Medication List       Waynetta Pean, PA-C 04/06/16 [place], MD 04/07/16 [phone]

## 2016-04-06 NOTE — H&P (Signed)
History and Physical    Sydney Phillips [id number] DOB: 07/19/1935 DOA: 04/06/2016  PCP: Laurey Morale, MD Patient coming from: home  Chief Complaint: [phone]   HPI: Sydney Phillips is a pleasant 80 y.o. female with medical history significant of 4 combination nonischemic ischemic cardiomyopathy status post CRT on [name] Lasix until one month ago, COPD and ongoing tobacco use, diabetes, hypertension, known left carotid stenosis, peripheral vascular disease, prior left subclavian and with stent, syncope presents to the emergency Department chief complaint of worsening shortness of breath. Initial evaluation reveals acute respiratory distress likely related to CHF exacerbation in the setting of mild COPD exacerbation.   Information is obtained from the patient and the son who is at the bedside and is primary caregiver and healthcare palliative attorney. They reported she woke up this morning with sudden onset shortness of breath. Associated symptoms include wheezing generalized weakness chest tightness but denies chest pain. She reports she had to hold about 4 days ago and was feeling "fine" until that. Since then she just "hasn't felt well". Reports noticing general slow progression of worsening shortness of breath over the last several months. She's been using her and inhalers with little relief. She denies headache dizziness syncope or near-syncope. She denies abdominal pain nausea vomiting diarrhea constipation melena or bright red blood per rectum. She denies any dysuria hematuria frequency or urgency. She denies fever chills lower extremity edema. She reports that she does weigh every day but clearly has no insight as to why. She was discharged from the hospital proximally a month ago. At that time she was on 80 mg of Lasix daily and that was discontinued due to worsening renal function. She has continued away but cannot articulate if her weight is gone up.  ED Course: AMS provided her  with 125 mg of Solu-Medrol and albuterol nebulizer. In the emergency department she displaced tachypnea respiratory distress, audible wheeze she is afebrile hemodynamically stable. She is placed on BiPAP at the time of admission is only mild increased work of breathing with conversation.  Review of Systems: As per HPI otherwise 10 point review of systems negative.   Ambulatory Status: He ambulates in the house with a rolling walker. Son reports no recent falls. She is moderate assist with ADLs  Past Medical History:  Diagnosis Date  . Arthritis   . Automatic implantable cardioverter-defibrillator in situ   . Bronchitis   . CAD (coronary artery disease)    Currently angina free, no evidence of reversible ischemia  . Chronic back pain   . Chronic constipation   . Colon polyps 2003.  2015.   HP polyps 2003.  adnomas 2015.  required referal to [facility] for colonoscopic resection of flat polyps.   . Congestive heart failure (CHF) (HCC)    New York Heart Association functional class 2, diastolic dysfunction  . COPD (chronic obstructive pulmonary disease) ([place])   . Depression with anxiety    takes Cymbalta daily  . Diabetes mellitus without complication (HCC)    no meds  . Early cataracts, bilateral   . Fibromyalgia   . GERD (gastroesophageal reflux disease)    was on meds but was taken off;now watches what she eats  . Hemorrhoids   . History of kidney stones   . Hx of colonic polyps   . Hyperlipemia    takes Crestor daily  . Hypertension    takes Amlodipine and Metoprolol daily  . Insomnia   . LBBB (left bundle branch block)  Stress test 09/03/2010, EF 55  . Myocardial infarction   . PAD (peripheral artery disease) (HCC)    Carotid, subclavian, and lower extremity beds, currently not symptomatic  . Presence of combination internal cardiac defibrillator (ICD) and pacemaker   . Presence of permanent cardiac pacemaker   . S/P angioplasty with stent, lt. subclavian 07/31/11 08/01/2011    . Shortness of breath    when over exerting self per pt.  . Subclavian arterial stenosis, lt, with PTA/STENT 07/31/11 08/01/2011  . Syncope 07/28/2011   EF - 50-55, moderate concentric hypertrophy in left ventricle  . Urinary incontinence   . Vertigo    takes Meclizine prn    Past Surgical History:  Procedure Laterality Date  . ABDOMINAL AORTAGRAM N/A 08/12/2013   Procedure: ABDOMINAL Maxcine Ham;  Surgeon: Elam Dutch, MD;  Location: [facility] CATH LAB;  Service: Cardiovascular;  Laterality: N/A;  . ABDOMINAL HYSTERECTOMY    . APPENDECTOMY    . BACK SURGERY  2012  . BIV ICD GENERTAOR CHANGE OUT Left 02/20/2012   Procedure: BIV ICD GENERTAOR CHANGE OUT;  Surgeon: [name], MD;  Location: [facility] CATH LAB;  Service: Cardiovascular;  Laterality: Left;  . CARDIAC CATHETERIZATION  12/01/2007   By Dr. Melvern Banker, left heart cath,   . CARDIAC DEFIBRILLATOR PLACEMENT  05/2008   By Dr Blanch Media, [employer] CANNOT HAVE MRI's  . CAROTID ANGIOGRAM N/A 07/31/2011   Procedure: CAROTID ANGIOGRAM;  Surgeon: Lorretta Harp, MD;  Location: [facility] CATH LAB;  Service: Cardiovascular;  Laterality: N/A;  carotid angiogram and possible Lt SCA PTA  . COLONOSCOPY W/ POLYPECTOMY  12/2013  . CORONARY ANGIOPLASTY    . ENDARTERECTOMY Left 11/08/2014   Procedure: LEFT CAROTID ENDARTERECTOMY WITH HEMASHIELD PATCH ANGIOPLASTY;  Surgeon: Elam Dutch, MD;  Location: [place];  Service: Vascular;  Laterality: Left;  . ESOPHAGOGASTRODUODENOSCOPY N/A 01/20/2014   Procedure: ESOPHAGOGASTRODUODENOSCOPY (EGD);  Surgeon: Jerene Bears, MD;  Location: [facility] ENDOSCOPY;  Service: Endoscopy;  Laterality: N/A;  . FEMORAL-POPLITEAL BYPASS GRAFT Right 10/12/2013   Procedure:   Femoral-Peroneal trunk  bypass with nonreversed greater saphenous vein graft;  Surgeon: Elam Dutch, MD;  Location: [place];  Service: Vascular;  Laterality: Right;  . GIVENS CAPSULE STUDY N/A 01/20/2014   Procedure: GIVENS CAPSULE STUDY;  Surgeon: Jerene Bears, MD;  Location: [place];  Service: Gastroenterology;  Laterality: N/A;  . INTRAOPERATIVE ARTERIOGRAM Right 10/12/2013   Procedure: INTRA OPERATIVE ARTERIOGRAM;  Surgeon: Elam Dutch, MD;  Location: [place];  Service: Vascular;  Laterality: Right;  . ORIF ELBOW FRACTURE  08/16/2011   Procedure: OPEN REDUCTION INTERNAL FIXATION (ORIF) ELBOW/OLECRANON FRACTURE;  Surgeon: Schuyler Amor, MD;  Location: [place];  Service: Orthopedics;  Laterality: Left;  . RENAL ANGIOGRAM N/A 08/12/2013   Procedure: RENAL ANGIOGRAM;  Surgeon: Elam Dutch, MD;  Location[facility] CATH LAB;  Service: Cardiovascular;  Laterality: N/A;  . SUBCLAVIAN STENT PLACEMENT Left 07/31/2011   7x18 Genesis, balloon, with reduction of 90% ostial left subclavian artery stenosis to 0% with residual excellent flow  . TONSILLECTOMY    . TUBAL LIGATION      Social History   Social History  . Marital status: Widowed    Spouse name: N/A  . Number of children: 3  . Years of education: N/A   Occupational History  . Not on file.   Social History Main Topics  . Smoking status: Current Every Day Smoker    Packs/day: 1.00    Years: 62.00  Types: Cigarettes  . Smokeless tobacco: Never Used     Comment: Down to 3-4 cigarettes per day  . Alcohol use No  . Drug use: No  . Sexual activity: No   Other Topics Concern  . Not on file   Social History Narrative   Ok to share information with medical POA, Son Gabriel Carina    Allergies  Allergen Reactions  . Codeine Itching  . Darvon Itching  . Meloxicam Other (See Comments)    Unknown    . Norco [Hydrocodone-Acetaminophen]     itching  . Potassium-Containing Compounds Other (See Comments)    Causes severe constipation  . Propoxyphene N-Acetaminophen Itching  . Rofecoxib Other (See Comments)    Unknown    . Rosuvastatin Other (See Comments)    cramps  . Statins Itching and Other (See Comments)    Sleeplessness  . Azithromycin Rash  . Erythromycin Rash  . Penicillins Rash  .  Sulfa Antibiotics Rash    Family History  Problem Relation Age of Onset  . CAD Father   . Heart disease Father   . Hyperlipidemia Father   . Heart disease Mother   . Deep vein thrombosis Son   . Hyperlipidemia    . Colon cancer Maternal Grandmother   . Cancer Sister     ovarian  . Diabetes Sister   . Heart disease Sister   . Anesthesia problems Neg Hx   . Hypotension Neg Hx   . Malignant hyperthermia Neg Hx   . Pseudochol deficiency Neg Hx     Prior to Admission medications   Medication Sig Start Date End Date Taking? Authorizing Provider  ACCU-CHEK FASTCLIX LANCETS MISC CHECK BLOOD SUGAR ONCE DAILY 07/24/15   Laurey Morale, MD  ACCU-CHEK SMARTVIEW test strip CHECK BLOOD SUGAR ONCE DAILY 08/09/15   Laurey Morale, MD  albuterol (PROVENTIL HFA;VENTOLIN HFA) 108 (90 Base) MCG/ACT inhaler Inhale 2 puffs into the lungs every 4 (four) hours as needed for wheezing or shortness of breath. 10/12/15   Laurey Morale, MD  allopurinol (ZYLOPRIM) 300 MG tablet Take 1 tablet (300 mg total) by mouth daily. 10/15/15   Laurey Morale, MD  ALPRAZolam Duanne Moron) 1 MG tablet TAKE ONE TABLET BY MOUTH EVERY 6 HOURS AS NEEDED FOR ANXIETY 12/11/15   Laurey Morale, MD  clopidogrel (PLAVIX) 75 MG tablet Take 1 tablet (75 mg total) by mouth daily. 06/27/15   Laurey Morale, MD  DULoxetine (CYMBALTA) 60 MG capsule TAKE ONE CAPSULE BY MOUTH TWICE DAILY 02/04/16   Laurey Morale, MD  HYDROcodone-acetaminophen (NORCO) 5-325 MG tablet Take 1 tablet by mouth every 6 (six) hours as needed for moderate pain. 02/14/16   Laurey Morale, MD  ketotifen (ZADITOR) 0.025 % ophthalmic solution Place 1 drop into both eyes 2 (two) times daily.    Historical Provider, MD  metFORMIN (GLUCOPHAGE) 500 MG tablet Take 1 tablet (500 mg total) by mouth 2 (two) times daily with a meal. 05/30/15   Laurey Morale, MD  metoprolol tartrate (LOPRESSOR) 25 MG tablet Take 1 tablet (25 mg total) by mouth daily. 05/30/15   Laurey Morale, MD  nitroGLYCERIN  (NITROSTAT) 0.4 MG SL tablet Place 1 tablet (0.4 mg total) under the tongue every 5 (five) minutes x 3 doses as needed for chest pain. 10/12/15   Laurey Morale, MD  Triamcinolone Acetonide (NASACORT AQ NA) Place into the nose daily. Over the counter    Historical Provider, MD  Physical Exam: Vitals:   04/06/16 1203 04/06/16 1230 04/06/16 1300 04/06/16 1302  BP:  (!) 151/53 137/71   Pulse:  103 (!) 44   Resp:  21 17 (S) 21  Temp:      TempSrc:      SpO2: 94% 95% 95%   Weight:      Height:         General:  Appears Slightly anxious only mildly uncomfortable on BiPAP Eyes:  PERRL, EOMI, normal lids, iris ENT:  grossly normal hearing, lips & tongue, mucous membranes of her mouth are pink but slightly dry Neck:  no LAD, masses or thyromegaly Cardiovascular:  RRR, no m/r/g. No LE edema. Pedal pulses present and palpable Respiratory:  Mild increased work of breathing with conversation on BiPAP. Breath sounds are quite diminished throughout bilaterally I hear fine crackles particularly in the bases very faint and expiratory wheezing coarse rhonchi throughout Abdomen:  Obese soft positive bowel sounds no guarding or rebounding Skin:  no rash or induration seen on limited exam Musculoskeletal:  grossly normal tone BUE/BLE, good ROM, no bony abnormality Psychiatric:  grossly normal mood and affect, speech fluent and appropriate, AOx3 Neurologic:  CN 2-12 grossly intact, moves all extremities in coordinated fashion, sensation intact  Labs on Admission: I have personally reviewed following labs and imaging studies  CBC:  Recent Labs Lab 04/06/16 1205  WBC 12.3*  NEUTROABS 6.9  HGB 13.5  HCT 39.8  MCV 96.4  PLT 123456   Basic Metabolic Panel:  Recent Labs Lab 04/06/16 1205  NA 141  K 3.8  CL 112*  CO2 22  GLUCOSE 127*  BUN 9  CREATININE 1.08*  CALCIUM 9.0   GFR: Estimated Creatinine Clearance: 48.6 mL/min (by C-G formula based on SCr of 1.08 mg/dL (H)). Liver Function  Tests: No results for input(s): AST, ALT, ALKPHOS, BILITOT, PROT, ALBUMIN in the last 168 hours. No results for input(s): LIPASE, AMYLASE in the last 168 hours. No results for input(s): AMMONIA in the last 168 hours. Coagulation Profile: No results for input(s): INR, PROTIME in the last 168 hours. Cardiac Enzymes: No results for input(s): CKTOTAL, CKMB, CKMBINDEX, TROPONINI in the last 168 hours. BNP (last 3 results) No results for input(s): PROBNP in the last 8760 hours. HbA1C: No results for input(s): HGBA1C in the last 72 hours. CBG: No results for input(s): GLUCAP in the last 168 hours. Lipid Profile: No results for input(s): CHOL, HDL, LDLCALC, TRIG, CHOLHDL, LDLDIRECT in the last 72 hours. Thyroid Function Tests: No results for input(s): TSH, T4TOTAL, FREET4, T3FREE, THYROIDAB in the last 72 hours. Anemia Panel: No results for input(s): VITAMINB12, FOLATE, FERRITIN, TIBC, IRON, RETICCTPCT in the last 72 hours. Urine analysis:    Component Value Date/Time   COLORURINE YELLOW 02/26/2016 1255   APPEARANCEUR CLOUDY (A) 02/26/2016 1255   LABSPEC 1.010 02/26/2016 1255   PHURINE 5.5 02/26/2016 1255   GLUCOSEU NEGATIVE 02/26/2016 1255   HGBUR NEGATIVE 02/26/2016 1255   HGBUR negative 01/01/2010 1353   BILIRUBINUR NEGATIVE 02/26/2016 1255   BILIRUBINUR n 05/30/2015 1542   KETONESUR NEGATIVE 02/26/2016 1255   PROTEINUR NEGATIVE 02/26/2016 1255   UROBILINOGEN 0.2 05/30/2015 1542   UROBILINOGEN 1.0 11/03/2014 1442   NITRITE NEGATIVE 02/26/2016 1255   LEUKOCYTESUR NEGATIVE 02/26/2016 1255    Creatinine Clearance: Estimated Creatinine Clearance: 48.6 mL/min (by C-G formula based on SCr of 1.08 mg/dL (H)).  Sepsis Labs: @LABRCNTIP (procalcitonin:4,lacticidven:4) )No results found for this or any previous visit (from the past 240 hour(s)).  Radiological Exams on Admission: Dg Chest Port 1 View  Result Date: 04/06/2016 CLINICAL DATA:  COPD and difficulty breathing EXAM:  PORTABLE CHEST 1 VIEW COMPARISON:  01/19/2014 FINDINGS: Cardiac shadow is enlarged. This is a significant change from the prior exam. Calcified granuloma in the left mid lung. A defibrillator is again seen and stable. Vascular congestion with interstitial edema is seen. IMPRESSION: Cardiomegaly with vascular congestion and interstitial edema. The changes of prior granulomatous disease. Electronically Signed   By: Inez Catalina M.D.   On: 04/06/2016 12:34    EKG: Independently reviewed. Sinus rhythm Ventricular premature complex Nonspecific IVCD with LAD LVH with secondary repolarization abnormality Inferior infarct, old Anterior infarct, old nonspecific T wave changes when compared to EKG from 2004 ssessment/Plan Principal Problem:   Acute respiratory distress Active Problems:   HLD (hyperlipidemia)   Essential hypertension   Coronary atherosclerosis   COPD (chronic obstructive pulmonary disease) with emphysema (HCC)   Biventricular implantable cardioverter-defibrillator in situ   Diabetes mellitus without complication ([place])   Cardiomyopathy ([place])   Smoking   Acute kidney injury ([place])   CHF exacerbation ([place])   #1. Acute respiratory distress likely related to CHF exacerbation the setting of mild COPD exacerbation. Likely related to recent discontinuation of Lasix due to renal function. Patient was on 80 mg of Lasix daily this was discontinued about a month ago. BNP slightly elevated. Chest x-ray vascular congestion with interstitial edema. Recent echo reveals an EF of 40-45% and grade 1 diastolic dysfunction. In the ED she tachypnea. Reportedly oxygen saturation level in the low 70s when EMS arrived. -Admit to stepdown -Continue BiPAP -IV Lasix 40 mg twice a day -Solu-Medrol -Nebulizers every 4 hours -Levaquin -Wean oxygen as able -Daily weights -Intake and output -Continue beta blocker -Likely need diuretic of some sort at discharge  #2. Acute on chronic diastolic heart failure. Lasix  recently discontinued. Receive 40 mg of Lasix in the emergency department -continue BB -daily weight -intake and output -ace inhibitor recently discharged as well.  -Monitor  #3. COPD exacerbation. He received Solu-Medrol albuterol nebulizer per EMS. Patient continues to smoke. Not on home oxygen. Son perceives a gradual worsening of disease over the last several months -Nebulizers as noted above -Sodium Medrol as noted above -Wean BiPAP and oxygen as able -Ambulated in hall with oxygen trial when appropriate -Anti-tussive  #4. Acute kidney injury. Creatinine 1.08. This is still much improved over creatinine at discharge a month ago. -Monitor urine output -Hold nephrotoxins as able -Monitor  #5. Hypertension. Fair control in the emergency department. -Continue Lopressor  #6. CAD. Describes some chest tightness but denied chest pain. She is currently on Plavix. Initial troponin negative. EKG without acute findings. -Cycle troponin -Continue -Monitor  7. Biventricular implantable cardioverter-defibrillator/combination of ischemic and nonischemic cardiomyopathy. See above. -Lasix as noted above -Daily weights  #8. Diabetes. Serum glucose 127 on admission. Home medications include metformin. Hemoglobin A1c 6.6 last month. -Hold metformin for now -Sliding scale insulin for optimal control  #9. Anxiety. Appears stable at baseline. Home medications include Xanax and Cymbalta -Continue Xanax at a slightly lower dose -Contiue Cymbalta   DVT prophylaxis: scd  Code Status: dnr  Family Communication: son at bedside  Disposition Plan: home  Consults called: none  Admission status: inpatient    Radene Gunning MD [facility]  If 7PM-7AM, please contact night-coverage [email or web address] Password [email or web address]  04/06/2016, 2:09 PM

## 2016-04-07 ENCOUNTER — Encounter (HOSPITAL_COMMUNITY): Payer: Self-pay

## 2016-04-07 ENCOUNTER — Inpatient Hospital Stay (HOSPITAL_COMMUNITY): Payer: Medicare Other

## 2016-04-07 DIAGNOSIS — R0602 Shortness of breath: Secondary | ICD-10-CM

## 2016-04-07 LAB — CBC
HCT: 37.6 % (ref 36.0–46.0)
Hemoglobin: 12.3 g/dL (ref 12.0–15.0)
MCH: 31.6 pg (ref 26.0–34.0)
MCHC: 32.7 g/dL (ref 30.0–36.0)
MCV: 96.7 fL (ref 78.0–100.0)
PLATELETS: 336 10*3/uL (ref 150–400)
RBC: 3.89 MIL/uL (ref 3.87–5.11)
RDW: 14.9 % (ref 11.5–15.5)
WBC: 15.1 10*3/uL — AB (ref 4.0–10.5)

## 2016-04-07 LAB — RESPIRATORY PANEL BY PCR
ADENOVIRUS-RVPPCR: NOT DETECTED
Bordetella pertussis: NOT DETECTED
CHLAMYDOPHILA PNEUMONIAE-RVPPCR: NOT DETECTED
CORONAVIRUS HKU1-RVPPCR: NOT DETECTED
CORONAVIRUS NL63-RVPPCR: NOT DETECTED
Coronavirus 229E: NOT DETECTED
Coronavirus OC43: NOT DETECTED
INFLUENZA A-RVPPCR: NOT DETECTED
Influenza B: NOT DETECTED
MYCOPLASMA PNEUMONIAE-RVPPCR: NOT DETECTED
Metapneumovirus: NOT DETECTED
Parainfluenza Virus 1: NOT DETECTED
Parainfluenza Virus 2: NOT DETECTED
Parainfluenza Virus 3: NOT DETECTED
Parainfluenza Virus 4: NOT DETECTED
Respiratory Syncytial Virus: NOT DETECTED
Rhinovirus / Enterovirus: NOT DETECTED

## 2016-04-07 LAB — BASIC METABOLIC PANEL
Anion gap: 10 (ref 5–15)
BUN: 14 mg/dL (ref 6–20)
CO2: 23 mmol/L (ref 22–32)
CREATININE: 1.32 mg/dL — AB (ref 0.44–1.00)
Calcium: 9.1 mg/dL (ref 8.9–10.3)
Chloride: 108 mmol/L (ref 101–111)
GFR calc Af Amer: 44 mL/min — ABNORMAL LOW (ref >60)
GFR, EST NON AFRICAN AMERICAN: 38 mL/min — AB (ref >60)
GLUCOSE: 154 mg/dL — AB (ref 65–99)
Potassium: 4 mmol/L (ref 3.5–5.1)
SODIUM: 141 mmol/L (ref 135–145)

## 2016-04-07 LAB — GLUCOSE, CAPILLARY
GLUCOSE-CAPILLARY: 160 mg/dL — AB (ref 65–99)
GLUCOSE-CAPILLARY: 139 mg/dL — AB (ref 65–99)
GLUCOSE-CAPILLARY: 138 mg/dL — AB (ref 65–99)
Glucose-Capillary: 180 mg/dL — ABNORMAL HIGH (ref 65–99)

## 2016-04-07 LAB — D-DIMER, QUANTITATIVE (NOT AT ARMC): D DIMER QUANT: 2.46 ug{FEU}/mL — AB (ref 0.00–0.50)

## 2016-04-07 MED ORDER — FUROSEMIDE 10 MG/ML IJ SOLN
60.0000 mg | Freq: Once | INTRAMUSCULAR | Status: AC
  Administered 2016-04-07: 60 mg via INTRAVENOUS
  Filled 2016-04-07: qty 6

## 2016-04-07 MED ORDER — SODIUM CHLORIDE 0.9 % IV SOLN
1.0000 g | Freq: Two times a day (BID) | INTRAVENOUS | Status: DC
  Administered 2016-04-07 – 2016-04-11 (×8): 1 g via INTRAVENOUS
  Filled 2016-04-07 (×11): qty 1

## 2016-04-07 MED ORDER — BUDESONIDE 0.25 MG/2ML IN SUSP
0.2500 mg | Freq: Two times a day (BID) | RESPIRATORY_TRACT | Status: DC
  Administered 2016-04-07 – 2016-04-11 (×8): 0.25 mg via RESPIRATORY_TRACT
  Filled 2016-04-07 (×8): qty 2

## 2016-04-07 MED ORDER — ORAL CARE MOUTH RINSE
15.0000 mL | Freq: Two times a day (BID) | OROMUCOSAL | Status: DC
  Administered 2016-04-08 – 2016-04-11 (×4): 15 mL via OROMUCOSAL

## 2016-04-07 MED ORDER — ENOXAPARIN SODIUM 40 MG/0.4ML ~~LOC~~ SOLN
40.0000 mg | SUBCUTANEOUS | Status: DC
Start: 2016-04-07 — End: 2016-04-11
  Administered 2016-04-07 – 2016-04-10 (×4): 40 mg via SUBCUTANEOUS
  Filled 2016-04-07 (×4): qty 0.4

## 2016-04-07 MED ORDER — IOPAMIDOL (ISOVUE-370) INJECTION 76%
INTRAVENOUS | Status: AC
  Administered 2016-04-07: 100 mL
  Filled 2016-04-07: qty 100

## 2016-04-07 MED ORDER — LEVALBUTEROL HCL 1.25 MG/0.5ML IN NEBU
1.2500 mg | INHALATION_SOLUTION | Freq: Three times a day (TID) | RESPIRATORY_TRACT | Status: DC
  Administered 2016-04-07 – 2016-04-08 (×2): 1.25 mg via RESPIRATORY_TRACT
  Filled 2016-04-07 (×2): qty 0.5

## 2016-04-07 MED ORDER — VANCOMYCIN HCL 10 G IV SOLR
1250.0000 mg | INTRAVENOUS | Status: DC
  Administered 2016-04-07 – 2016-04-10 (×4): 1250 mg via INTRAVENOUS
  Filled 2016-04-07 (×5): qty 1250

## 2016-04-07 NOTE — Progress Notes (Signed)
**  Preliminary report by tech**  **Preliminary report by tech**  Bilateral lower extremity venous duplex completed. There is no evidence of deep or superficial vein thrombosis involving the right and left lower extremities. All visualized vessels appear patent and compressible. There is no evidence of Baker's cysts bilaterally.  04/07/16 6:18 PM Carlos Levering RVT

## 2016-04-07 NOTE — Progress Notes (Signed)
[facility] PROGRESS NOTE  Sydney Phillips [id number] DOB: 13-Jul-1935 DOA: 04/06/2016   PCP: Laurey Morale, MD     Assessment/Plan: Principal Problem:   Acute respiratory distress Active Problems:   HLD (hyperlipidemia)   Essential hypertension   Coronary atherosclerosis   COPD (chronic obstructive pulmonary disease) with emphysema (HCC)   Biventricular implantable cardioverter-defibrillator in situ   Diabetes mellitus without complication ([place])   Cardiomyopathy ([place])   Smoking   Acute kidney injury ([place])   CHF exacerbation ([place])   80 y.o. female with medical history significant of 4 combination nonischemic ischemic cardiomyopathy status post CRT on [name] Lasix until one month ago, COPD and ongoing tobacco use, diabetes, hypertension, known left carotid stenosis, peripheral vascular disease, prior left subclavian and with stent, syncope presents to the emergency Department chief complaint of worsening shortness of breath. Initial evaluation reveals acute respiratory distress likely related to CHF exacerbation in the setting of mild COPD exacerbation.    She was admitted between 9/12-9/15. She was found to have altered mental status likely secondary to dehydration and acute kidney injury. Lasix and ACE inhibitor were discontinued.   Patient has continued to smoke until 2 days prior to admission.  ED Course: AMS provided her with 125 mg of Solu-Medrol and albuterol nebulizer. In the emergency department she displaced tachypnea respiratory distress, audible wheeze she is afebrile hemodynamically stable. She is placed on BiPAP at the time of admission is only mild increased work of breathing with conversation.  Assessment and plan #1. Acute respiratory distress likely related to CHF exacerbation the setting of mild COPD exacerbation. Likely related to recent discontinuation of Lasix due to renal function. Patient was on 80 mg of Lasix daily this was discontinued about  a month ago. BNP slightly elevated. Chest x-ray vascular congestion with interstitial edema. Recent echo reveals an EF of 40-45% and grade 1 diastolic dysfunction. In the ED she tachypnea. Reportedly oxygen saturation level in the low 70s when EMS arrived. Cont  stepdown Off  BiPAP, on HFNC at 8L  Hold Lasix for now,CTA chest PE protocol to rule out PE -Continue Solu-Medrol -Nebulizers every 4 hours -Levaquin -Wean oxygen as able -Daily weights -Intake and output -Continue beta blocker    #2. Acute on chronic diastolic heart failure. Lasix recently discontinued.  Restarted on diuresis IV since admission-continue BB -daily weight -intake and output Continue to hold ACE inhibitor given risk of renal failure Hold IV Lasix as the patient will receive IV contrast to rule out PE   #3. Acute on chronic COPD exacerbation. Not on oxygen at home  Continue Solu-Medrol albuterol nebulizer per EMS. Patient continues to smoke. Not on home oxygen. Son perceives a gradual worsening of disease over the last several months -Nebulizers as noted above -Wean BiPAP and oxygen as able -Ambulated in hall with oxygen trial when appropriate -Anti-tussive If patient is found to have pneumonia will change Levaquin to HCAP coverage due to recent hospitalization  #4. Acute kidney injury. Creatinine 1.08. This is still much improved over creatinine at discharge a month ago. Creatinine slightly up today, hold IV Lasix  Recheck tomorrow  #5. Hypertension. Fair control in the emergency department. -Continue Lopressor  #6. CAD. Describes some chest tightness but denied chest pain. She is currently on Plavix. Initial troponin negative. EKG without acute findings. -Cycle troponin -Continue -Monitor  7. Biventricular implantable cardioverter-defibrillator/combination of ischemic and nonischemic cardiomyopathy. See above. -Lasix as noted above -Daily weights  #8. Diabetes. Serum  glucose 127 on admission.  Home medications include metformin. Hemoglobin A1c 6.6 last month. -Hold metformin for now -Sliding scale insulin for optimal control  #9. Anxiety. Appears stable at baseline. Home medications include Xanax and Cymbalta -Continue Xanax at a slightly lower dose -Contiue Cymbalta  #10 PVD, known severe, (previuosly asymptomatic) LSCA disease, now with "high grade" RICA disease/S/P angioplasty with stent, lt. subclavian 07/31/11/Left carotid stenosis Patient had arterial Doppler for Dr. Oneida Alar office Will check venous Doppler to rule out DVT  #11  Chronic gout -Continue allopurinol   DVT prophylaxsis Lovenox   Code Status:  DNR   Family Communication: Discussed in detail with the patient, all imaging results, lab results explained to the patient   Disposition Plan:  continue stepdown     Consultants:  None   Procedures:  None   Antibiotics: Anti-infectives    Start     Dose/Rate Route Frequency Ordered Stop   04/06/16 1500  levofloxacin (LEVAQUIN) IVPB 500 mg     500 mg 100 mL/hr over 60 Minutes Intravenous Every 24 hours 04/06/16 1407           HPI/Subjective: Continues to need high flow nasal cannula  Objective: Vitals:   04/07/16 0816 04/07/16 0900 04/07/16 1008 04/07/16 1155  BP:   99/61   Pulse: 99  (!) 108   Resp: (!) 21  (!) 28   Temp: 98.1 F (36.7 C)  97.8 F (36.6 C)   TempSrc: Axillary  Oral   SpO2: 97% 95% 92% 93%  Weight:      Height:        Intake/Output Summary (Last 24 hours) at 04/07/16 1255 Last data filed at 04/07/16 1012  Gross per 24 hour  Intake              580 ml  Output              975 ml  Net             -395 ml    Exam:  Examination:  General exam: Appears calm and comfortable  Respiratory system: Clear to auscultation. Respiratory effort normal. Cardiovascular system: S1 & S2 heard, RRR. No JVD, murmurs, rubs, gallops or clicks. No pedal edema. Gastrointestinal system: Abdomen is nondistended, soft and nontender.  No organomegaly or masses felt. Normal bowel sounds heard. Central nervous system: Alert and oriented. No focal neurological deficits. Extremities: Symmetric 5 x 5 power. Skin: No rashes, lesions or ulcers Psychiatry: Judgement and insight appear normal. Mood & affect appropriate.     Data Reviewed: I have personally reviewed following labs and imaging studies  Micro Results Recent Results (from the past 240 hour(s))  MRSA PCR Screening     Status: None   Collection Time: 04/06/16  3:43 PM  Result Value Ref Range Status   MRSA by PCR NEGATIVE NEGATIVE Final    Comment:        The GeneXpert MRSA Assay (FDA approved for NASAL specimens only), is one component of a comprehensive MRSA colonization surveillance program. It is not intended to diagnose MRSA infection nor to guide or monitor treatment for MRSA infections.     Radiology Reports Dg Chest Port 1 View  Result Date: 04/06/2016 CLINICAL DATA:  COPD and difficulty breathing EXAM: PORTABLE CHEST 1 VIEW COMPARISON:  01/19/2014 FINDINGS: Cardiac shadow is enlarged. This is a significant change from the prior exam. Calcified granuloma in the left mid lung. A defibrillator is again seen and stable. Vascular congestion with  interstitial edema is seen. IMPRESSION: Cardiomegaly with vascular congestion and interstitial edema. The changes of prior granulomatous disease. Electronically Signed   By: Inez Catalina M.D.   On: 04/06/2016 12:34     CBC  Recent Labs Lab 04/06/16 1205 04/07/16 0431  WBC 12.3* 15.1*  HGB 13.5 12.3  HCT 39.8 37.6  PLT 312 336  MCV 96.4 96.7  MCH 32.7 31.6  MCHC 33.9 32.7  RDW 14.7 14.9  LYMPHSABS 4.0  --   MONOABS 1.0  --   EOSABS 0.3  --   BASOSABS 0.0  --     Chemistries   Recent Labs Lab 04/06/16 1205 04/07/16 0431  NA 141 141  K 3.8 4.0  CL 112* 108  CO2 22 23  GLUCOSE 127* 154*  BUN 9 14  CREATININE 1.08* 1.32*  CALCIUM 9.0 9.1    ------------------------------------------------------------------------------------------------------------------ estimated creatinine clearance is 39.9 mL/min (by C-G formula based on SCr of 1.32 mg/dL (H)). ------------------------------------------------------------------------------------------------------------------ No results for input(s): HGBA1C in the last 72 hours. ------------------------------------------------------------------------------------------------------------------ No results for input(s): CHOL, HDL, LDLCALC, TRIG, CHOLHDL, LDLDIRECT in the last 72 hours. ------------------------------------------------------------------------------------------------------------------ No results for input(s): TSH, T4TOTAL, T3FREE, THYROIDAB in the last 72 hours.  Invalid input(s): FREET3 ------------------------------------------------------------------------------------------------------------------ No results for input(s): VITAMINB12, FOLATE, FERRITIN, TIBC, IRON, RETICCTPCT in the last 72 hours.  Coagulation profile No results for input(s): INR, PROTIME in the last 168 hours.  No results for input(s): DDIMER in the last 72 hours.  Cardiac Enzymes  Recent Labs Lab 04/06/16 1539 04/06/16 2050  TROPONINI <0.03 <0.03   ------------------------------------------------------------------------------------------------------------------ Invalid input(s): POCBNP   CBG:  Recent Labs Lab 04/06/16 1630 04/06/16 2135 04/07/16 0742 04/07/16 1113  GLUCAP 333* 267* 160* 180*       Studies: Dg Chest Port 1 View  Result Date: 04/06/2016 CLINICAL DATA:  COPD and difficulty breathing EXAM: PORTABLE CHEST 1 VIEW COMPARISON:  01/19/2014 FINDINGS: Cardiac shadow is enlarged. This is a significant change from the prior exam. Calcified granuloma in the left mid lung. A defibrillator is again seen and stable. Vascular congestion with interstitial edema is seen. IMPRESSION:  Cardiomegaly with vascular congestion and interstitial edema. The changes of prior granulomatous disease. Electronically Signed   By: Inez Catalina M.D.   On: 04/06/2016 12:34      Lab Results  Component Value Date   HGBA1C 6.6 (H) 02/26/2016   HGBA1C 7.8 (H) 05/30/2015   HGBA1C 7.0 (H) 12/29/2014   Lab Results  Component Value Date   LDLCALC 148 (H) 01/21/2014   CREATININE 1.32 (H) 04/07/2016       Scheduled Meds: . albuterol  2.5 mg Nebulization Q4H  . allopurinol  300 mg Oral Daily  . clopidogrel  75 mg Oral Daily  . DULoxetine  60 mg Oral BID  . fluticasone  1 spray Each Nare Daily  . insulin aspart  0-9 Units Subcutaneous TID WC  . ketotifen  1 drop Both Eyes BID  . levofloxacin (LEVAQUIN) IV  500 mg Intravenous Q24H  . methylPREDNISolone (SOLU-MEDROL) injection  40 mg Intravenous Q6H  . metoprolol tartrate  25 mg Oral Daily  . sodium chloride flush  3 mL Intravenous Q12H   Continuous Infusions: . albuterol 10 mg/hr (04/06/16 1200)     LOS: 1 day    Time spent: >30 MINS    [facility] Pager [phone]. If 7PM-7AM, please contact night-coverage at [email or web address], password [facility] 04/07/2016, 12:55 PM  LOS: 1 day

## 2016-04-07 NOTE — Care Management Note (Addendum)
Case Management Note  Patient Details  Name: MORGANE MAFFETT MRN: [id number] Date of Birth: 02/24/1936  Subjective/Objective:   Pt presenting for Acute Respiratory Distress. Pt is from home with son and pt is not on home 02. Pt has DME Rw and cane. CM did discuss the benefit of having a HHRN once stable for d/c. CM will continue to monitor if need 02 for home.                  Action/Plan: Pt not agreeable to [facility] at this time. Will follow up. CM did speak with Heart Failure Navigator to see if she could speak with pt in regards to education. Pt can get [facility] services as well. [facility] will follow the patient to see if services needed.   Expected Discharge Date:                  Expected Discharge Plan:  [place]  In-House Referral:  NA  Discharge planning Services  CM Consult  Post Acute Care Choice:   Home Health, Durable Medical Equipment.  Choice offered to:   Patient  DME Arranged:   Oxygen DME Agency:   [place] Arranged:   Registered Nurse, Physical Therapy HH Agency:   [place]  Status of Service: Completed.  If discussed at [place] of Stay Meetings, dates discussed:    Additional Comments: 1412 04-11-16 Jacqlyn Krauss, RN,BSN [phone] Pt did receive 02 from [facility]. No further needs from CM at this time.    102 04-11-16 Jacqlyn Krauss, RN,BSN [phone] [facility] is aware that PT will need to be added to services. Plan for d/c today. No further needs from CM at this time.    1029 04-09-16 Paisley Grajeda Graves-Bigelow, RN,BSN [phone] CM did speak with pt in regards to disposition needs. Agency list provided to patient. Pt chose  and is agreeable to [facility] via [facility]. CM did make referral and SOC to begin within 24-48 hours post d/c. [facility] to follow th patient as well. MD pt will need [place] orders for [facility] and F2F once stable for d/c. PT to consult and CM will continue to monitor.   Bethena Roys,  RN 04/07/2016, 12:38 PM

## 2016-04-07 NOTE — Consult Note (Signed)
   [facility] Inpatient Consult   04/07/2016  ANATALIA OTEY [birth year] [id number]   Patient screened for potential [place] Management services. Patient is eligible for [facility] Care Management services under patient's [name] plan. Chart review reveals the patient is a 80 y.o. female with a Past Medical History of combined CM, sp AICD, CAD sp stent placement, copd w/ ongoing tob abuse, not currently on home O2, chf, dm controlled, fibromyalgia/anxiety, chronic gout,, gerd, hld, htn, lbbb, insomnia, pad who presents with worsening sob, found to have COPD and likely Acute decompensated HFas well. Continues to use high flow oxygen.  Spoke with inpatient RNCM of patient's el;igibility and will follow up for community needs.    For questions contact:   Natividad Brood, RN BSN [facility] Liaison  [phone] business mobile phone Toll free office [phone]

## 2016-04-07 NOTE — Progress Notes (Signed)
Pharmacy Antibiotic Note  Sydney Phillips is a 80 y.o. female admitted on 04/06/2016 with pneumonia.  Pharmacy has been consulted for meropenem and vancomycin dosing.  Plan: Vancomycin 1250 mg IV every 24 hours.  Goal trough 15-20 mcg/mL.  Meropenem 1000 mg IV every 12 hours  Height: 5\' 8"  (172.7 cm) Weight: 185 lb 9.6 oz (84.2 kg) IBW/kg (Calculated) : 63.9  Temp (24hrs), Avg:98.3 F (36.8 C), Min:97.8 F (36.6 C), Max:99 F (37.2 C)   Recent Labs Lab 04/06/16 1205 04/07/16 0431  WBC 12.3* 15.1*  CREATININE 1.08* 1.32*    Estimated Creatinine Clearance: 39.9 mL/min (by C-G formula based on SCr of 1.32 mg/dL (H)).    Allergies  Allergen Reactions  . Potassium-Containing Compounds Other (See Comments)    Causes severe constipation  . Azithromycin Rash  . Codeine Itching  . Darvon Itching  . Erythromycin Rash  . Meloxicam Other (See Comments)    Unknown    . Norco [Hydrocodone-Acetaminophen] Itching  . Penicillins Rash  . Propoxyphene N-Acetaminophen Itching  . Rofecoxib Other (See Comments)    Unknown    . Rosuvastatin Other (See Comments)    cramps  . Statins Itching and Other (See Comments)    Sleeplessness  . Sulfa Antibiotics Rash    Thank you for allowing pharmacy to be a part of this patient's care.  Nicole Kindred L [name] 04/07/2016 6:00 PM

## 2016-04-07 NOTE — Progress Notes (Signed)
Pt family is requesting for nicotine patch. Also per family, HCPOA Gabriel Carina. Pt may have been exposed to molds. He requests to be called regarding this matter and get updated on her plan of care. Thanks

## 2016-04-08 ENCOUNTER — Other Ambulatory Visit: Payer: Self-pay

## 2016-04-08 DIAGNOSIS — R0603 Acute respiratory distress: Secondary | ICD-10-CM

## 2016-04-08 DIAGNOSIS — J438 Other emphysema: Secondary | ICD-10-CM

## 2016-04-08 DIAGNOSIS — I509 Heart failure, unspecified: Secondary | ICD-10-CM

## 2016-04-08 LAB — COMPREHENSIVE METABOLIC PANEL
ALBUMIN: 3.7 g/dL (ref 3.5–5.0)
ALT: 16 U/L (ref 14–54)
ANION GAP: 10 (ref 5–15)
AST: 26 U/L (ref 15–41)
Alkaline Phosphatase: 74 U/L (ref 38–126)
BILIRUBIN TOTAL: 0.4 mg/dL (ref 0.3–1.2)
BUN: 31 mg/dL — ABNORMAL HIGH (ref 6–20)
CO2: 22 mmol/L (ref 22–32)
Calcium: 9 mg/dL (ref 8.9–10.3)
Chloride: 106 mmol/L (ref 101–111)
Creatinine, Ser: 1.5 mg/dL — ABNORMAL HIGH (ref 0.44–1.00)
GFR calc non Af Amer: 32 mL/min — ABNORMAL LOW (ref >60)
GFR, EST AFRICAN AMERICAN: 37 mL/min — AB (ref >60)
GLUCOSE: 150 mg/dL — AB (ref 65–99)
POTASSIUM: 3.9 mmol/L (ref 3.5–5.1)
Sodium: 138 mmol/L (ref 135–145)
TOTAL PROTEIN: 6.6 g/dL (ref 6.5–8.1)

## 2016-04-08 LAB — CBC
HEMATOCRIT: 38.2 % (ref 36.0–46.0)
Hemoglobin: 12.8 g/dL (ref 12.0–15.0)
MCH: 32.2 pg (ref 26.0–34.0)
MCHC: 33.5 g/dL (ref 30.0–36.0)
MCV: 96.2 fL (ref 78.0–100.0)
Platelets: 360 10*3/uL (ref 150–400)
RBC: 3.97 MIL/uL (ref 3.87–5.11)
RDW: 14.9 % (ref 11.5–15.5)
WBC: 31.3 10*3/uL — ABNORMAL HIGH (ref 4.0–10.5)

## 2016-04-08 LAB — GLUCOSE, CAPILLARY
GLUCOSE-CAPILLARY: 153 mg/dL — AB (ref 65–99)
GLUCOSE-CAPILLARY: 154 mg/dL — AB (ref 65–99)
Glucose-Capillary: 170 mg/dL — ABNORMAL HIGH (ref 65–99)

## 2016-04-08 MED ORDER — METOLAZONE 5 MG PO TABS
5.0000 mg | ORAL_TABLET | Freq: Every day | ORAL | Status: AC
  Administered 2016-04-08: 5 mg via ORAL
  Filled 2016-04-08: qty 1

## 2016-04-08 MED ORDER — FUROSEMIDE 10 MG/ML IJ SOLN
60.0000 mg | Freq: Once | INTRAMUSCULAR | Status: AC
  Administered 2016-04-08: 60 mg via INTRAVENOUS
  Filled 2016-04-08: qty 6

## 2016-04-08 MED ORDER — FUROSEMIDE 10 MG/ML IJ SOLN
60.0000 mg | Freq: Two times a day (BID) | INTRAMUSCULAR | Status: DC
  Administered 2016-04-08 – 2016-04-11 (×5): 60 mg via INTRAVENOUS
  Filled 2016-04-08 (×6): qty 6

## 2016-04-08 MED ORDER — LEVALBUTEROL HCL 1.25 MG/0.5ML IN NEBU
1.2500 mg | INHALATION_SOLUTION | Freq: Three times a day (TID) | RESPIRATORY_TRACT | Status: DC
  Administered 2016-04-08 – 2016-04-11 (×8): 1.25 mg via RESPIRATORY_TRACT
  Filled 2016-04-08 (×8): qty 0.5

## 2016-04-08 MED ORDER — POTASSIUM CHLORIDE CRYS ER 20 MEQ PO TBCR
20.0000 meq | EXTENDED_RELEASE_TABLET | Freq: Once | ORAL | Status: AC
  Administered 2016-04-08: 20 meq via ORAL
  Filled 2016-04-08: qty 1

## 2016-04-08 NOTE — Consult Note (Addendum)
Name: Sydney Phillips MRN: [id number] DOB: 1935-10-14    ADMISSION DATE:  04/06/2016 CONSULTATION DATE:  04/08/16  REFERRING MD :  Dr. Allyson Sabal   CHIEF COMPLAINT:  SOB - "the air jumps out of my lungs into my stomach"   HISTORY OF PRESENT ILLNESS:  80 y/o F, current smoker (smoked since age 51, 1ppd) with complex medical history to include arthritis, chronic constipation, chronic back pain, depression / anxiety, fibromyalgia, DM, GERD, HTN, CAD s/p CABG, CHF / CM s/p AICD, MI, PAD s/p CEA, subclavian arterial stenosis and COPD who presented to [facility] on 10/22 with complaints of shortness of breath.    The patient reports she was in her usual state of health until the day of admit when she was attempting to get out of bed and she became acutely short of breath.  She denied known fevers, wheezing, weight gain (reports she weighs herself daily), nausea / vomiting and diarrhea.  She reports she does not wear O2 at home.  No recent changes in home.  Reports one dog.  Denies allergies.    The patient was recently admitted (9/12-9/15) with altered mental status & falls in the setting of dehydration and AKI.  Symptoms improved with volume resuscitation.  Due to AKI, she was taken off her diuretics and lisinopril.  She followed up with her primary MD on 9/20 and was euvolemic on exam at that time.    Initial ER evaluation 10/22 concerning for hypoxemic respiratory failure in the setting of decompensated CHF (rales on exam).  She required BiPAP therapy.  CXR was concerning for pulmonary edema.  She was simultaneously treated for a COPD exacerbation as well.  She was also noted to have L>R pleural effusions with compressive atelectasis on CXR.  PCCM called for evaluation of pleural effusions.    PAST MEDICAL HISTORY :   has a past medical history of Arthritis; Automatic implantable cardioverter-defibrillator in situ; Bronchitis; CAD (coronary artery disease); Chronic back pain; Chronic constipation; Colon  polyps (2003.  2015.); Congestive heart failure (CHF) ([place]); COPD (chronic obstructive pulmonary disease) ([place]); Depression with anxiety; Diabetes mellitus without complication ([place]); Early cataracts, bilateral; Fibromyalgia; GERD (gastroesophageal reflux disease); Hemorrhoids; History of kidney stones; colonic polyps; Hyperlipemia; Hypertension; Insomnia; LBBB (left bundle branch block); Myocardial infarction; PAD (peripheral artery disease) ([place]); Presence of combination internal cardiac defibrillator (ICD) and pacemaker; Presence of permanent cardiac pacemaker; S/P angioplasty with stent, lt. subclavian 07/31/11 (08/01/2011); Shortness of breath; Subclavian arterial stenosis, lt, with PTA/STENT 07/31/11 (08/01/2011); Syncope (07/28/2011); Urinary incontinence; and Vertigo.  has a past surgical history that includes Appendectomy; Tonsillectomy; Tubal ligation; Cardiac defibrillator placement (05/2008); Abdominal hysterectomy; Back surgery (2012); ORIF elbow fracture (08/16/2011); Cardiac catheterization (12/01/2007); Coronary angioplasty; Subclavian stent placement (Left, 07/31/2011); Femoral-popliteal Bypass Graft (Right, 10/12/2013); Intraoperative arteriogram (Right, 10/12/2013); Colonoscopy w/ polypectomy (12/2013); Esophagogastroduodenoscopy (N/A, 01/20/2014); Givens capsule study (N/A, 01/20/2014); carotid angiogram (N/A, 07/31/2011); Biv icd genertaor change out (Left, 02/20/2012); abdominal aortagram (N/A, 08/12/2013); renal angiogram (N/A, 08/12/2013); and Endarterectomy (Left, 11/08/2014).   Prior to Admission medications   Medication Sig Start Date End Date Taking? Authorizing Provider  ACCU-CHEK FASTCLIX LANCETS MISC CHECK BLOOD SUGAR ONCE DAILY 07/24/15  Yes Laurey Morale, MD  ACCU-CHEK SMARTVIEW test strip CHECK BLOOD SUGAR ONCE DAILY 08/09/15  Yes Laurey Morale, MD  albuterol (PROVENTIL HFA;VENTOLIN HFA) 108 (90 Base) MCG/ACT inhaler Inhale 2 puffs into the lungs every 4 (four) hours as needed for wheezing or  shortness of breath. 10/12/15  Yes Laurey Morale,  MD  allopurinol (ZYLOPRIM) 300 MG tablet Take 1 tablet (300 mg total) by mouth daily. 10/15/15  Yes Laurey Morale, MD  ALPRAZolam (XANAX) 1 MG tablet TAKE ONE TABLET BY MOUTH EVERY 6 HOURS AS NEEDED FOR ANXIETY Patient taking differently: TAKE 1 mg BY MOUTH EVERY 6 HOURS AS NEEDED FOR ANXIETY 12/11/15  Yes Laurey Morale, MD  DULoxetine (CYMBALTA) 60 MG capsule TAKE ONE CAPSULE BY MOUTH TWICE DAILY Patient taking differently: TAKE 60 MG BY MOUTH TWICE DAILY 02/04/16  Yes Laurey Morale, MD  ketotifen (ZADITOR) 0.025 % ophthalmic solution Place 1 drop into both eyes 2 (two) times daily.   Yes Historical Provider, MD  metFORMIN (GLUCOPHAGE) 500 MG tablet Take 1 tablet (500 mg total) by mouth 2 (two) times daily with a meal. 05/30/15  Yes Laurey Morale, MD  metoprolol tartrate (LOPRESSOR) 25 MG tablet Take 1 tablet (25 mg total) by mouth daily. 05/30/15  Yes Laurey Morale, MD  clopidogrel (PLAVIX) 75 MG tablet Take 1 tablet (75 mg total) by mouth daily. Patient not taking: Reported on 04/07/2016 06/27/15   Laurey Morale, MD  HYDROcodone-acetaminophen [facility]) 5-325 MG tablet Take 1 tablet by mouth every 6 (six) hours as needed for moderate pain. 02/14/16   Laurey Morale, MD  nitroGLYCERIN (NITROSTAT) 0.4 MG SL tablet Place 1 tablet (0.4 mg total) under the tongue every 5 (five) minutes x 3 doses as needed for chest pain. 10/12/15   Laurey Morale, MD  Triamcinolone Acetonide (NASACORT AQ NA) Place 2 sprays into the nose daily as needed (for congestion). Over the counter     Historical Provider, MD   Allergies  Allergen Reactions  . Potassium-Containing Compounds Other (See Comments)    Causes severe constipation  . Azithromycin Rash  . Codeine Itching  . Darvon Itching  . Erythromycin Rash  . Meloxicam Other (See Comments)    Unknown    . Norco [Hydrocodone-Acetaminophen] Itching  . Penicillins Rash  . Propoxyphene N-Acetaminophen Itching  . Rofecoxib  Other (See Comments)    Unknown    . Rosuvastatin Other (See Comments)    cramps  . Statins Itching and Other (See Comments)    Sleeplessness  . Sulfa Antibiotics Rash    FAMILY HISTORY:  family history includes CAD in her father; Cancer in her sister; Colon cancer in her maternal grandmother; Deep vein thrombosis in her son; Diabetes in her sister; Heart disease in her father, mother, and sister; Hyperlipidemia in her father.   SOCIAL HISTORY:  reports that she has been smoking Cigarettes.  She has a 62.00 pack-year smoking history. She has never used smokeless tobacco. She reports that she does not drink alcohol or use drugs.  REVIEW OF SYSTEMS:  POSITIVES IN BOLD Constitutional: Negative for fever, chills, weight loss, malaise/fatigue and diaphoresis.  HENT: Negative for hearing loss, ear pain, nosebleeds, congestion, sore throat, neck pain, tinnitus and ear discharge.   Eyes: Negative for blurred vision, double vision, photophobia, pain, discharge and redness.  Respiratory: Negative for cough, hemoptysis, sputum production, shortness of breath, wheezing and stridor.   Cardiovascular: Negative for chest pain, palpitations, orthopnea, claudication, leg swelling and PND.  Gastrointestinal: Negative for heartburn, nausea, vomiting, abdominal pain, diarrhea, constipation, blood in stool and melena.  Genitourinary: Negative for dysuria, urgency, frequency, hematuria and flank pain.  Musculoskeletal: Negative for myalgias, back pain, joint pain and falls.  Skin: Negative for itching and rash.  Neurological: Negative for dizziness, tingling, tremors, sensory change, speech  change, focal weakness, seizures, loss of consciousness, weakness and headaches.  Endo/Heme/Allergies: Negative for environmental allergies and polydipsia. Does not bruise/bleed easily.  SUBJECTIVE:   VITAL SIGNS: Temp:  [97.7 F (36.5 C)-98 F (36.7 C)] 97.8 F (36.6 C) (10/24 1245) Pulse Rate:  [89-101] 91 (10/24  1245) Resp:  [12-26] 19 (10/24 1245) BP: (104-154)/(58-73) 154/73 (10/24 0952) SpO2:  [91 %-99 %] 99 % (10/24 1245) Weight:  [180 lb 11.2 oz (82 kg)] 180 lb 11.2 oz (82 kg) (10/24 0500)  PHYSICAL EXAMINATION: General:  Obese elderly female in NAD, dyspnea with exertion upon getting back to bed  Neuro:  AAOx4, speech clear, MAE  HEENT:  MM pink/moist, no jvd Cardiovascular:  s1s2 rrr, no m/r/g  Lungs:  Even/non-labored, lungs clear anterior, 6L O2 per  Abdomen:  Obese soft, bsx4 active  Musculoskeletal:  No acute deformities  Skin:  Warm/dry, 1+ pitting edema    Recent Labs Lab 04/06/16 1205 04/07/16 0431 04/08/16 0336  NA 141 141 138  K 3.8 4.0 3.9  CL 112* 108 106  CO2 22 23 22   BUN 9 14 31*  CREATININE 1.08* 1.32* 1.50*  GLUCOSE 127* 154* 150*    Recent Labs Lab 04/06/16 1205 04/07/16 0431 04/08/16 0336  HGB 13.5 12.3 12.8  HCT 39.8 37.6 38.2  WBC 12.3* 15.1* 31.3*  PLT 312 336 360   Ct Angio Chest Pe W Or Wo Contrast  Result Date: 04/07/2016 CLINICAL DATA:  Shortness of breath. EXAM: CT ANGIOGRAPHY CHEST WITH CONTRAST TECHNIQUE: Multidetector CT imaging of the chest was performed using the standard protocol during bolus administration of intravenous contrast. Multiplanar CT image reconstructions and MIPs were obtained to evaluate the vascular anatomy. CONTRAST:  70 cc Isovue 370. COMPARISON:  12/01/2007. FINDINGS: Cardiovascular: There is suboptimal opacification and streak artifact within segmental and subsegmental pulmonary arteries, as well as respiratory motion, limiting evaluation. No definite pulmonary embolus. Atherosclerotic calcification of the arterial vasculature with luminal narrowing of the left subclavian artery at its origin. Coronary artery calcification. Heart is enlarged. No pericardial effusion. Mediastinum/Nodes: Mediastinal lymph nodes measure up to 11 mm in the low right paratracheal station. Hilar lymph nodes measure up to 10 mm on the right. No  axillary adenopathy. There may be a diverticulum off the distal esophagus. Lungs/Pleura: Image quality is rather degraded by respiratory motion. Moderate bilateral pleural effusions with collapse/ consolidation in the lower lobes, left greater than right. Calcified granulomas in the left lower lobe. Emphysema. Airway is grossly unremarkable. Upper Abdomen: Visualized portions of the liver, gallbladder, adrenal glands, kidneys, spleen, pancreas, stomach and bowel are grossly unremarkable. No upper abdominal adenopathy. Musculoskeletal: No worrisome lytic or sclerotic lesions. Degenerative changes are seen in the spine. Review of the MIP images confirms the above findings. IMPRESSION: 1. Assessment for segmental and subsegmental pulmonary emboli is limited by suboptimal opacification, streak artifact and respiratory motion. No definite central or lobar pulmonary embolus. 2. Moderate bilateral pleural effusions with collapse/consolidation in the adjacent lower lobes, left greater than right. Pneumonia is not excluded. 3. Aortic atherosclerosis (ICD10-170.0). Coronary artery calcification. 4. Likely reactive mediastinal and hilar lymph nodes. Electronically Signed   By: Lorin Picket M.D.   On: 04/07/2016 16:05    SIGNIFICANT EVENTS  10/22  Admit for CHF exacerbation   STUDIES:  ECHO 9/13 >> EF 40-45%, diffuse hypokinesis, grade 1 diastolic dysfunction, mild MR, PA peak 36 mmHg  ASSESSMENT / PLAN:  80 year old female with PMH of COPD (likely) who was supposed to  be on O2 at home but was not compliant and is on albuterol at home.  Presenting with SOB.  Patient was taken off her lasix 1 month ago (not sure why), sleeps with an elevated head of the bed and can not lie flat.  BUN:Cr ratio is 10:1.  On exam, diffuse crackles and dullness to percussion at the bases, 1+ pitting edema.  Patient only negative 2L over a 3 day period (not sure if intake has been counted).  I reviewed chest CT myself, bilateral  pleural effusion and pulmonary edema noted.  Discussed with TR[facility]-MD and PCCM-NP.  Pleural effusion and pulmonary edema due to heart failure.  May consider calling cardiology.  Bilateral pleural effusion:             - F/U CXR.             - No thora for now.             - Active diureses as below.  Pulmonary edema: Due to CHF.             - Zaroxolyn 5 mg PO x1.             - Extra dose of 60 of lasix now.             - Strict I/O.             - K-Dur 20 meq PO x1.  Obstructive lung disease:             - Albuterol PRN             - Need PFT's upon discharge.             - Arrange for f/u with PCCM upon discharge.  Hypoxemia:             - Titrate O2 for sat of 90-92% given pulmonary HTN             - Arrange for home O2.             - D/C BiPAP.             - Active diureses  CHF:             - May consider a cardiology consult.             - Active diureses as above.  Pulmonary Hypertension: due to heart failure  - Diureses  - Oxygen level as above  Code status: DNR  Patient seen and examined, agree with above note.  I dictated the care and orders written for this patient under my direction.  Rush Farmer, MD [phone]  04/08/2016, 1:41 PM

## 2016-04-08 NOTE — Progress Notes (Signed)
Heart Failure Navigator Consult Note  Presentation: Sydney Phillips is a 80 y.o. female with a Past Medical History of combined CM, sp AICD, CAD sp stent placement, copd w/ ongoing tob abuse, not currently on home O2, chf, dm controlled, fibromyalgia/anxiety, chronic gout,, gerd, hld, htn, lbbb, insomnia, pad who presents with worsening sob, found to have AECOPD and likely Acute decompensated CHF as well.   Past Medical History:  Diagnosis Date  . Arthritis   . Automatic implantable cardioverter-defibrillator in situ   . Bronchitis   . CAD (coronary artery disease)    Currently angina free, no evidence of reversible ischemia  . Chronic back pain   . Chronic constipation   . Colon polyps 2003.  2015.   HP polyps 2003.  adnomas 2015.  required referal to [facility] for colonoscopic resection of flat polyps.   . Congestive heart failure (CHF) (H[facility])    New York Heart Association functional class 2, diastolic dysfunction  . COPD (chronic obstructive pulmonary disease) ([place])   . Depression with anxiety    takes Cymbalta daily  . Diabetes mellitus without complication ([facility])    no meds  . Early cataracts, bilateral   . Fibromyalgia   . GERD (gastroesophageal reflux disease)    was on meds but was taken off;now watches what she eats  . Hemorrhoids   . History of kidney stones   . Hx of colonic polyps   . Hyperlipemia    takes Crestor daily  . Hypertension    takes Amlodipine and Metoprolol daily  . Insomnia   . LBBB (left bundle branch block)    Stress test 09/03/2010, EF 55  . Myocardial infarction   . PAD (peripheral artery disease) ([facility])    Carotid, subclavian, and lower extremity beds, currently not symptomatic  . Presence of combination internal cardiac defibrillator (ICD) and pacemaker   . Presence of permanent cardiac pacemaker   . S/P angioplasty with stent, lt. subclavian 07/31/11 08/01/2011  . Shortness of breath    when over exerting self per pt.  . Subclavian arterial  stenosis, lt, with PTA/STENT 07/31/11 08/01/2011  . Syncope 07/28/2011   EF - 50-55, moderate concentric hypertrophy in left ventricle  . Urinary incontinence   . Vertigo    takes Meclizine prn    Social History   Social History  . Marital status: Widowed    Spouse name: N/A  . Number of children: 3  . Years of education: N/A   Social History Main Topics  . Smoking status: Current Every Day Smoker    Packs/day: 1.00    Years: 62.00    Types: Cigarettes  . Smokeless tobacco: Never Used     Comment: Down to 3-4 cigarettes per day  . Alcohol use No  . Drug use: No  . Sexual activity: No   Other Topics Concern  . None   Social History Narrative   Ok to share information with medical POA, Son Gabriel Carina    ECHO:Study Conclusions--02/27/16  - Left ventricle: The cavity size was normal. Systolic function was   mildly to moderately reduced. The estimated ejection fraction was   in the range of 40% to 45%. Diffuse hypokinesis. Doppler   parameters are consistent with abnormal left ventricular   relaxation (grade 1 diastolic dysfunction). - Mitral valve: There was mild regurgitation. - Right ventricle: Pacer wire or catheter noted in right ventricle. - Pulmonary arteries: Systolic pressure was mildly increased. PA   peak pressure: 36 mm Hg (  S).  Impressions:  - When compared to prior, EF is reduced (55%)   BNP    Component Value Date/Time   BNP 308.6 (H) 04/06/2016 1205    ProBNP    Component Value Date/Time   PROBNP 185.1 01/19/2014 1333     Education Assessment and Provision:  Detailed education and instructions provided on heart failure disease management including the following:  Signs and symptoms of Heart Failure When to call the physician Importance of daily weights Low sodium diet Fluid restriction Medication management Anticipated future follow-up appointments  Patient education given on each of the above topics.  Patient acknowledges  understanding and acceptance of all instructions.  I spoke with Ms. Ostermiller regarding her current admission and HF diagnosis.  She tells me that she currently lives with her son.  She admits that she does not have money for her medications -however her son buys them for her.  She has poor eyesight secondary to cataracts and complains that she is very limited in her activities at home--(unable to see TV, crochet or read).  I reinforced daily weights and how weight increases relate to signs symptom of HF.  She does say that she has a scale and telehealth program through [facility].  I reviewed a low sodium diet and high sodium foods to avoid.  She is limited by the fact that she does not have any teeth.  She says she does most of the cooking and avoids added salt.  She follows with [facility].   Education Materials:  "Living Better With Heart Failure" Booklet, Daily Weight Tracker Tool   High Risk Criteria for Readmission and/or Poor Patient Outcomes:   EF <30%- No 40-45%   2 or more admissions in 6 months-2/61mo  Difficult social situation- yes limited finances.  Demonstrates medication noncompliance- denies    Barriers of Care:   Knowledge, compliance, finances.  Discharge Planning:  Plans to return to home with son.  She would benefit from [facility] for ongoing HF education, compliance reinforcement and symptom recognition.

## 2016-04-08 NOTE — Progress Notes (Signed)
[facility] PROGRESS NOTE  Sydney Phillips [id number] DOB: December 28, 1935 DOA: 04/06/2016   PCP: Laurey Morale, MD     Assessment/Plan: Principal Problem:   Acute respiratory distress Active Problems:   HLD (hyperlipidemia)   Essential hypertension   Coronary atherosclerosis   COPD (chronic obstructive pulmonary disease) with emphysema (HCC)   Biventricular implantable cardioverter-defibrillator in situ   Diabetes mellitus without complication ([place])   Cardiomyopathy ([place])   Smoking   Acute kidney injury ([place])   CHF exacerbation ([place])   80 y.o. female with medical history significant of 4 combination nonischemic ischemic cardiomyopathy status post CRT on [name] Lasix until one month ago, COPD and ongoing tobacco use, diabetes, hypertension, known left carotid stenosis, peripheral vascular disease, prior left subclavian and with stent, syncope presents to the emergency Department chief complaint of worsening shortness of breath. Initial evaluation reveals acute respiratory distress likely related to CHF exacerbation in the setting of mild COPD exacerbation.    She was admitted between 9/12-9/15. She was found to have altered mental status likely secondary to dehydration and acute kidney injury. Lasix and ACE inhibitor were discontinued.   Patient has continued to smoke until 2 days prior to admission.  ED Course: AMS provided her with 125 mg of Solu-Medrol and albuterol nebulizer. In the emergency department she displaced tachypnea respiratory distress, audible wheeze she is afebrile hemodynamically stable. She is placed on BiPAP at the time of admission is only mild increased work of breathing with conversation.  Assessment and plan #1. Acute respiratory distress likely related to CHF exacerbation the setting of mild COPD exacerbation. Likely related to recent discontinuation of Lasix due to renal function. Patient was on 80 mg of Lasix daily this was discontinued about  a month ago. BNP slightly elevated. Chest x-ray vascular congestion with interstitial edema. Recent echo reveals an EF of 40-45% and grade 1 diastolic dysfunction. In the ED she tachypnea. Reportedly oxygen saturation level in the low 70s when EMS arrived. Cont  stepdown Off  BiPAP, on HFNC at 8L Resumed Lasix for now,CTA shows   Moderate bilateral pleural effusions with collapse/consolidation in the adjacent lower lobes, no  segmental and subsegmental PE but suboptimal study  -Continue Solu-Medrol -Nebulizers every 4 hours -Levaquin changed to HCAP regimen  -Wean oxygen as able -Daily weights -Intake and output -Continue beta blocker PCCM consult if not able to wean oxygen soon     #2. Acute on chronic diastolic heart failure. Lasix recently discontinued.  Restarted on diuresis IV since admission-continue BB -daily weight -intake and output Continue to hold ACE inhibitor given risk of renal failure  creatinine trending up, continue diuresis cautiously    #3. Acute on chronic COPD exacerbation. Not on oxygen at home  Continue Solu-Medrol albuterol nebulizer per EMS. Patient continues to smoke. Not on home oxygen. Son perceives a gradual worsening of disease over the last several months -Nebulizers as noted above -Wean BiPAP and oxygen as able -Ambulated in hall with oxygen trial when appropriate -Anti-tussive   HCAP coverage due to recent hospitalization  #4. Acute kidney injury. Creatinine 1.08.>1.5  Creatinine slightly up today,sob improved with IV Lasix Will continue diuresis   #5. Hypertension. Fair control in the emergency department. -Continue Lopressor  #6. CAD. Describes some chest tightness but denied chest pain. She is currently on Plavix. Initial troponin negative. EKG without acute findings.  troponin negative     7. Biventricular implantable cardioverter-defibrillator/combination of ischemic and nonischemic cardiomyopathy. See above. -Lasix  as noted  above -Daily weights  #8. Diabetes. Serum glucose 127 on admission. Home medications include metformin. Hemoglobin A1c 6.6 last month. -Hold metformin for now -Sliding scale insulin for optimal control  #9. Anxiety. Appears stable at baseline. Home medications include Xanax and Cymbalta -Continue Xanax at a slightly lower dose -Contiue Cymbalta  #10 PVD, known severe, (previuosly asymptomatic) LSCA disease, now with "high grade" RICA disease/S/P angioplasty with stent, lt. subclavian 07/31/11/Left carotid stenosis Patient had arterial Doppler for Dr. Oneida Alar office  venous Doppler  no evidence of deep or superficial vein thrombosis involving the right and left lower extremities  #11  Chronic gout -Continue allopurinol   DVT prophylaxsis Lovenox   Code Status:  DNR   Family Communication: Discussed in detail with the patient, all imaging results, lab results explained to the patient   Disposition Plan:  continue stepdown     Consultants:  None   Procedures:  None   Antibiotics: Anti-infectives    Start     Dose/Rate Route Frequency Ordered Stop   04/07/16 1815  meropenem (MERREM) 1 g in sodium chloride 0.9 % 100 mL IVPB     1 g 200 mL/hr over 30 Minutes Intravenous Every 12 hours 04/07/16 1800     04/07/16 1815  vancomycin (VANCOCIN) 1,250 mg in sodium chloride 0.9 % 250 mL IVPB     1,250 mg 166.7 mL/hr over 90 Minutes Intravenous Every 24 hours 04/07/16 1800     04/06/16 1500  levofloxacin (LEVAQUIN) IVPB 500 mg  Status:  Discontinued     500 mg 100 mL/hr over 60 Minutes Intravenous Every 24 hours 04/06/16 1407 04/07/16 1747         HPI/Subjective: Continues to need high flow nasal cannula  Objective: Vitals:   04/08/16 0800 04/08/16 0803 04/08/16 0815 04/08/16 0952  BP:    (!) 154/73  Pulse:   95 93  Resp:   17   Temp:   97.9 F (36.6 C)   TempSrc:   Oral   SpO2: 95% 94% 97%   Weight:      Height:        Intake/Output Summary (Last 24 hours) at  04/08/16 1207 Last data filed at 04/08/16 0954  Gross per 24 hour  Intake             1553 ml  Output             2300 ml  Net             -747 ml    Exam:  Examination:  General exam: Appears calm and comfortable  Respiratory system: Clear to auscultation. Respiratory effort normal. Cardiovascular system: S1 & S2 heard, RRR. No JVD, murmurs, rubs, gallops or clicks. No pedal edema. Gastrointestinal system: Abdomen is nondistended, soft and nontender. No organomegaly or masses felt. Normal bowel sounds heard. Central nervous system: Alert and oriented. No focal neurological deficits. Extremities: Symmetric 5 x 5 power. Skin: No rashes, lesions or ulcers Psychiatry: Judgement and insight appear normal. Mood & affect appropriate.     Data Reviewed: I have personally reviewed following labs and imaging studies  Micro Results Recent Results (from the past 240 hour(s))  MRSA PCR Screening     Status: None   Collection Time: 04/06/16  3:43 PM  Result Value Ref Range Status   MRSA by PCR NEGATIVE NEGATIVE Final    Comment:        The GeneXpert MRSA Assay (FDA approved for NASAL specimens  only), is one component of a comprehensive MRSA colonization surveillance program. It is not intended to diagnose MRSA infection nor to guide or monitor treatment for MRSA infections.   Respiratory Panel by PCR     Status: None   Collection Time: 04/07/16  2:30 PM  Result Value Ref Range Status   Adenovirus NOT DETECTED NOT DETECTED Final   Coronavirus 229E NOT DETECTED NOT DETECTED Final   Coronavirus HKU1 NOT DETECTED NOT DETECTED Final   Coronavirus NL63 NOT DETECTED NOT DETECTED Final   Coronavirus OC43 NOT DETECTED NOT DETECTED Final   Metapneumovirus NOT DETECTED NOT DETECTED Final   Rhinovirus / Enterovirus NOT DETECTED NOT DETECTED Final   Influenza A NOT DETECTED NOT DETECTED Final   Influenza B NOT DETECTED NOT DETECTED Final   Parainfluenza Virus 1 NOT DETECTED NOT DETECTED  Final   Parainfluenza Virus 2 NOT DETECTED NOT DETECTED Final   Parainfluenza Virus 3 NOT DETECTED NOT DETECTED Final   Parainfluenza Virus 4 NOT DETECTED NOT DETECTED Final   Respiratory Syncytial Virus NOT DETECTED NOT DETECTED Final   Bordetella pertussis NOT DETECTED NOT DETECTED Final   Chlamydophila pneumoniae NOT DETECTED NOT DETECTED Final   Mycoplasma pneumoniae NOT DETECTED NOT DETECTED Final    Radiology Reports Ct Angio Chest Pe W Or Wo Contrast  Result Date: 04/07/2016 CLINICAL DATA:  Shortness of breath. EXAM: CT ANGIOGRAPHY CHEST WITH CONTRAST TECHNIQUE: Multidetector CT imaging of the chest was performed using the standard protocol during bolus administration of intravenous contrast. Multiplanar CT image reconstructions and MIPs were obtained to evaluate the vascular anatomy. CONTRAST:  70 cc Isovue 370. COMPARISON:  12/01/2007. FINDINGS: Cardiovascular: There is suboptimal opacification and streak artifact within segmental and subsegmental pulmonary arteries, as well as respiratory motion, limiting evaluation. No definite pulmonary embolus. Atherosclerotic calcification of the arterial vasculature with luminal narrowing of the left subclavian artery at its origin. Coronary artery calcification. Heart is enlarged. No pericardial effusion. Mediastinum/Nodes: Mediastinal lymph nodes measure up to 11 mm in the low right paratracheal station. Hilar lymph nodes measure up to 10 mm on the right. No axillary adenopathy. There may be a diverticulum off the distal esophagus. Lungs/Pleura: Image quality is rather degraded by respiratory motion. Moderate bilateral pleural effusions with collapse/ consolidation in the lower lobes, left greater than right. Calcified granulomas in the left lower lobe. Emphysema. Airway is grossly unremarkable. Upper Abdomen: Visualized portions of the liver, gallbladder, adrenal glands, kidneys, spleen, pancreas, stomach and bowel are grossly unremarkable. No upper  abdominal adenopathy. Musculoskeletal: No worrisome lytic or sclerotic lesions. Degenerative changes are seen in the spine. Review of the MIP images confirms the above findings. IMPRESSION: 1. Assessment for segmental and subsegmental pulmonary emboli is limited by suboptimal opacification, streak artifact and respiratory motion. No definite central or lobar pulmonary embolus. 2. Moderate bilateral pleural effusions with collapse/consolidation in the adjacent lower lobes, left greater than right. Pneumonia is not excluded. 3. Aortic atherosclerosis (ICD10-170.0). Coronary artery calcification. 4. Likely reactive mediastinal and hilar lymph nodes. Electronically Signed   By: Lorin Picket M.D.   On: 04/07/2016 16:05   Dg Chest Port 1 View  Result Date: 04/06/2016 CLINICAL DATA:  COPD and difficulty breathing EXAM: PORTABLE CHEST 1 VIEW COMPARISON:  01/19/2014 FINDINGS: Cardiac shadow is enlarged. This is a significant change from the prior exam. Calcified granuloma in the left mid lung. A defibrillator is again seen and stable. Vascular congestion with interstitial edema is seen. IMPRESSION: Cardiomegaly with vascular congestion and interstitial edema. The  changes of prior granulomatous disease. Electronically Signed   By: Inez Catalina M.D.   On: 04/06/2016 12:34     CBC  Recent Labs Lab 04/06/16 1205 04/07/16 0431 04/08/16 0336  WBC 12.3* 15.1* 31.3*  HGB 13.5 12.3 12.8  HCT 39.8 37.6 38.2  PLT 312 336 360  MCV 96.4 96.7 96.2  MCH 32.7 31.6 32.2  MCHC 33.9 32.7 33.5  RDW 14.7 14.9 14.9  LYMPHSABS 4.0  --   --   MONOABS 1.0  --   --   EOSABS 0.3  --   --   BASOSABS 0.0  --   --     Chemistries   Recent Labs Lab 04/06/16 1205 04/07/16 0431 04/08/16 0336  NA 141 141 138  K 3.8 4.0 3.9  CL 112* 108 106  CO2 22 23 22   GLUCOSE 127* 154* 150*  BUN 9 14 31*  CREATININE 1.08* 1.32* 1.50*  CALCIUM 9.0 9.1 9.0  AST  --   --  26  ALT  --   --  16  ALKPHOS  --   --  74  BILITOT   --   --  0.4   ------------------------------------------------------------------------------------------------------------------ estimated creatinine clearance is 34.7 mL/min (by C-G formula based on SCr of 1.5 mg/dL (H)). ------------------------------------------------------------------------------------------------------------------ No results for input(s): HGBA1C in the last 72 hours. ------------------------------------------------------------------------------------------------------------------ No results for input(s): CHOL, HDL, LDLCALC, TRIG, CHOLHDL, LDLDIRECT in the last 72 hours. ------------------------------------------------------------------------------------------------------------------ No results for input(s): TSH, T4TOTAL, T3FREE, THYROIDAB in the last 72 hours.  Invalid input(s): FREET3 ------------------------------------------------------------------------------------------------------------------ No results for input(s): VITAMINB12, FOLATE, FERRITIN, TIBC, IRON, RETICCTPCT in the last 72 hours.  Coagulation profile No results for input(s): INR, PROTIME in the last 168 hours.   Recent Labs  04/07/16 1234  DDIMER 2.46*    Cardiac Enzymes  Recent Labs Lab 04/06/16 1539 04/06/16 2050  TROPONINI <0.03 <0.03   ------------------------------------------------------------------------------------------------------------------ Invalid input(s): POCBNP   CBG:  Recent Labs Lab 04/07/16 1113 04/07/16 1620 04/07/16 2105 04/08/16 0726 04/08/16 1146  GLUCAP 180* 139* 138* 153* 154*       Studies: Ct Angio Chest Pe W Or Wo Contrast  Result Date: 04/07/2016 CLINICAL DATA:  Shortness of breath. EXAM: CT ANGIOGRAPHY CHEST WITH CONTRAST TECHNIQUE: Multidetector CT imaging of the chest was performed using the standard protocol during bolus administration of intravenous contrast. Multiplanar CT image reconstructions and MIPs were obtained to evaluate the  vascular anatomy. CONTRAST:  70 cc Isovue 370. COMPARISON:  12/01/2007. FINDINGS: Cardiovascular: There is suboptimal opacification and streak artifact within segmental and subsegmental pulmonary arteries, as well as respiratory motion, limiting evaluation. No definite pulmonary embolus. Atherosclerotic calcification of the arterial vasculature with luminal narrowing of the left subclavian artery at its origin. Coronary artery calcification. Heart is enlarged. No pericardial effusion. Mediastinum/Nodes: Mediastinal lymph nodes measure up to 11 mm in the low right paratracheal station. Hilar lymph nodes measure up to 10 mm on the right. No axillary adenopathy. There may be a diverticulum off the distal esophagus. Lungs/Pleura: Image quality is rather degraded by respiratory motion. Moderate bilateral pleural effusions with collapse/ consolidation in the lower lobes, left greater than right. Calcified granulomas in the left lower lobe. Emphysema. Airway is grossly unremarkable. Upper Abdomen: Visualized portions of the liver, gallbladder, adrenal glands, kidneys, spleen, pancreas, stomach and bowel are grossly unremarkable. No upper abdominal adenopathy. Musculoskeletal: No worrisome lytic or sclerotic lesions. Degenerative changes are seen in the spine. Review of the MIP images confirms the above findings. IMPRESSION: 1.  Assessment for segmental and subsegmental pulmonary emboli is limited by suboptimal opacification, streak artifact and respiratory motion. No definite central or lobar pulmonary embolus. 2. Moderate bilateral pleural effusions with collapse/consolidation in the adjacent lower lobes, left greater than right. Pneumonia is not excluded. 3. Aortic atherosclerosis (ICD10-170.0). Coronary artery calcification. 4. Likely reactive mediastinal and hilar lymph nodes. Electronically Signed   By: Lorin Picket M.D.   On: 04/07/2016 16:05   Dg Chest Port 1 View  Result Date: 04/06/2016 CLINICAL DATA:  COPD  and difficulty breathing EXAM: PORTABLE CHEST 1 VIEW COMPARISON:  01/19/2014 FINDINGS: Cardiac shadow is enlarged. This is a significant change from the prior exam. Calcified granuloma in the left mid lung. A defibrillator is again seen and stable. Vascular congestion with interstitial edema is seen. IMPRESSION: Cardiomegaly with vascular congestion and interstitial edema. The changes of prior granulomatous disease. Electronically Signed   By: Inez Catalina M.D.   On: 04/06/2016 12:34      Lab Results  Component Value Date   HGBA1C 6.6 (H) 02/26/2016   HGBA1C 7.8 (H) 05/30/2015   HGBA1C 7.0 (H) 12/29/2014   Lab Results  Component Value Date   LDLCALC 148 (H) 01/21/2014   CREATININE 1.50 (H) 04/08/2016       Scheduled Meds: . allopurinol  300 mg Oral Daily  . budesonide (PULMICORT) nebulizer solution  0.25 mg Nebulization BID  . clopidogrel  75 mg Oral Daily  . DULoxetine  60 mg Oral BID  . enoxaparin (LOVENOX) injection  40 mg Subcutaneous Q24H  . fluticasone  1 spray Each Nare Daily  . insulin aspart  0-9 Units Subcutaneous TID WC  . ketotifen  1 drop Both Eyes BID  . levalbuterol  1.25 mg Nebulization Q8H  . mouth rinse  15 mL Mouth Rinse BID  . meropenem (MERREM) IV  1 g Intravenous Q12H  . methylPREDNISolone (SOLU-MEDROL) injection  40 mg Intravenous Q6H  . metoprolol tartrate  25 mg Oral Daily  . sodium chloride flush  3 mL Intravenous Q12H  . vancomycin  1,250 mg Intravenous Q24H   Continuous Infusions:     LOS: 2 days    Time spent: >30 MINS    [facility]  [facility] Pager [phone]. If 7PM-7AM, please contact night-coverage at [email or web address], password [facility] 04/08/2016, 12:07 PM  LOS: 2 days

## 2016-04-08 NOTE — Consult Note (Addendum)
   [facility] Inpatient Consult   04/08/2016  Sydney Phillips 11-Oct-1935 [id number]   Patient evaluated for community based chronic disease management services with [place] [facility] as a benefit of patient's [employer].  Chart review reveals that Sydney Phillips is a 80 y.o.femalewith a Past Medical History of combined CM, sp AICD, CAD sp stent placement, copd w/ ongoing tob abuse, not currently on home O2, HF, DM controlled, fibromyalgia/anxiety, chronic GOUT, GERD, HLD, htn,LBBB insomnia, pad who presents with worsening sob, found to have AECOPD and likely Acute decompensated CHF as well. Met with the patient at bedside and had spoken with the inpatient RNCM that the patient has declined home health services but could benefit from care management with HF education and post hospital follow up and resources. Met with the patient at the bedside.  She denies any problems with getting to MD appointments, she has no problems getting medications but sometimes have difficulty affording them but has been able to manage.  She endorses Dr. Alysia Penna as her primary care provider. Consent form signed and copies given.  Patient states she does have the tele-monitoring program from [employer] and she had not always been consistent with weighing daily.   Patient will receive post hospital discharge call and will be evaluated for monthly home visits for assessments and disease process education.  Left contact information and [facility] literature at bedside. Made Inpatient Case Manager aware that [place] following. Of note, [facility] Care Management services does not replace or interfere with any services that are arranged by inpatient case management or social work.  For additional questions or referrals please contact:   Natividad Brood, RN BSN [facility] Liaison  [phone] business mobile phone Toll free office [phone]

## 2016-04-09 ENCOUNTER — Inpatient Hospital Stay (HOSPITAL_COMMUNITY): Payer: Medicare Other

## 2016-04-09 DIAGNOSIS — J9621 Acute and chronic respiratory failure with hypoxia: Secondary | ICD-10-CM

## 2016-04-09 DIAGNOSIS — J90 Pleural effusion, not elsewhere classified: Secondary | ICD-10-CM

## 2016-04-09 LAB — COMPREHENSIVE METABOLIC PANEL
ALBUMIN: 3.8 g/dL (ref 3.5–5.0)
ALT: 19 U/L (ref 14–54)
ANION GAP: 12 (ref 5–15)
AST: 26 U/L (ref 15–41)
Alkaline Phosphatase: 77 U/L (ref 38–126)
BILIRUBIN TOTAL: 0.6 mg/dL (ref 0.3–1.2)
BUN: 47 mg/dL — ABNORMAL HIGH (ref 6–20)
CHLORIDE: 102 mmol/L (ref 101–111)
CO2: 26 mmol/L (ref 22–32)
Calcium: 9.4 mg/dL (ref 8.9–10.3)
Creatinine, Ser: 1.59 mg/dL — ABNORMAL HIGH (ref 0.44–1.00)
GFR calc Af Amer: 35 mL/min — ABNORMAL LOW (ref >60)
GFR, EST NON AFRICAN AMERICAN: 30 mL/min — AB (ref >60)
Glucose, Bld: 151 mg/dL — ABNORMAL HIGH (ref 65–99)
POTASSIUM: 4.4 mmol/L (ref 3.5–5.1)
Sodium: 140 mmol/L (ref 135–145)
TOTAL PROTEIN: 6.7 g/dL (ref 6.5–8.1)

## 2016-04-09 LAB — T4, FREE: FREE T4: 1.03 ng/dL (ref 0.61–1.12)

## 2016-04-09 LAB — TSH: TSH: 0.354 u[IU]/mL (ref 0.350–4.500)

## 2016-04-09 LAB — GLUCOSE, CAPILLARY
GLUCOSE-CAPILLARY: 235 mg/dL — AB (ref 65–99)
GLUCOSE-CAPILLARY: 197 mg/dL — AB (ref 65–99)
GLUCOSE-CAPILLARY: 157 mg/dL — AB (ref 65–99)
Glucose-Capillary: 184 mg/dL — ABNORMAL HIGH (ref 65–99)

## 2016-04-09 MED ORDER — METOLAZONE 5 MG PO TABS
5.0000 mg | ORAL_TABLET | Freq: Every day | ORAL | Status: AC
  Administered 2016-04-09: 5 mg via ORAL
  Filled 2016-04-09: qty 1

## 2016-04-09 NOTE — Progress Notes (Signed)
[facility] PROGRESS NOTE  Sydney Phillips [id number] DOB: [birth year] DOA: 04/06/2016   PCP: Laurey Morale, MD     Assessment/Plan: Principal Problem:   Acute respiratory distress Active Problems:   HLD (hyperlipidemia)   Essential hypertension   Coronary atherosclerosis   COPD (chronic obstructive pulmonary disease) with emphysema (HCC)   Biventricular implantable cardioverter-defibrillator in situ   Diabetes mellitus without complication ([place])   Cardiomyopathy ([place])   Smoking   Acute kidney injury ([place])   CHF exacerbation ([place])   80 y.o. female with medical history significant of 4 combination nonischemic ischemic cardiomyopathy status post CRT on [name] Lasix until one month ago, COPD and ongoing tobacco use, diabetes, hypertension, known left carotid stenosis, peripheral vascular disease, prior left subclavian and with stent, syncope presents to the emergency Department chief complaint of worsening shortness of breath. Initial evaluation reveals acute respiratory distress likely related to CHF exacerbation in the setting of mild COPD exacerbation.    She was admitted between 9/12-9/15. She was found to have altered mental status likely secondary to dehydration and acute kidney injury. Lasix and ACE inhibitor were discontinued.   Patient has continued to smoke until 2 days prior to admission.  ED Course: AMS provided her with 125 mg of Solu-Medrol and albuterol nebulizer. In the emergency department she displaced tachypnea respiratory distress, audible wheeze she is afebrile hemodynamically stable. She is placed on BiPAP at the time of admission is only mild increased work of breathing with conversation.  Assessment and plan ##1. Acute respiratory distress likely related to CHF exacerbation/COPD exacerbation  Likely related to recent discontinuation of Lasix due to renal function. Patient was on 80 mg of Lasix daily this was discontinued about a month ago. BNP  slightly elevated. Chest x-ray vascular congestion with interstitial edema. Recent echo reveals an EF of 40-45% and grade 1 diastolic dysfunction.In the ED she tachypnea.Reportedly oxygen saturation level in the low 70s when EMS arrived. Cont  Stepdown level of care Off  BiPAP, on HFNC at 8L>7L Continue Lasix 60 mg IV twice a day, status post Zaroxolyn 5 mg 1 dose -Continue Solu-Medrol -Nebulizers every 4 hours Continue treatment for healthcare associated pneumonia -Wean oxygen as able -Daily weights - CTA shows   Moderate bilateral pleural effusions with collapse/consolidation in the adjacent lower lobes, no  segmental and subsegmental PE but suboptimal study   Cardiology consultation for management of heart failure      #2. Acute on chronic combined systolic/diastolic heart failure. Lasix recently discontinued.  Currently on Lasix IV, Zaroxolyn, beta blocker -daily weight -intake and output Continue to hold ACE inhibitor given risk of renal failure  creatinine trending up, continue diuresis cautiously  Check TSH and free T4   #3. Acute on chronic COPD exacerbation. Not on oxygen at home  Continue Solu-Medrol albuterol nebulizer per EMS. Patient continues to smoke. Not on home oxygen. Son perceives a gradual worsening of disease over the last several months -Nebulizers as noted above -Wean BiPAP and oxygen as able -Ambulated in hall with oxygen trial when appropriate -Anti-tussive  HCAP coverage due to recent hospitalization  #4. Acute kidney injury. Creatinine 1.08.>1.5>1.59  Creatinine slightly up today,sob improved with IV Lasix Will continue diuresis   #5. Hypertension. Fair control in the emergency department. -Continue Lopressor  #6. CAD. Describes some chest tightness but denied chest pain. She is currently on Plavix. Initial troponin negative. EKG without acute findings.  troponin negative     7. Biventricular implantable  cardioverter-defibrillator/combination of ischemic and nonischemic cardiomyopathy. See above. -Lasix as noted above -Daily weights  #8. Diabetes. Serum glucose 127 on admission. Home medications include metformin. Hemoglobin A1c 6.6 last month. Continue SSI -Hold metformin for now    #9. Anxiety. Appears stable at baseline. Home medications include Xanax and Cymbalta -Continue Xanax at a slightly lower dose -Contiue Cymbalta  #10 PVD, known severe, (previuosly asymptomatic) LSCA disease, now with "high grade" RICA disease/S/P angioplasty with stent, lt. subclavian 07/31/11/Left carotid stenosis Patient had arterial Doppler for Dr. Oneida Alar office  venous Doppler  no evidence of deep or superficial vein thrombosis involving the right and left lower extremities  #11  Chronic gout -Continue allopurinol   DVT prophylaxsis Lovenox   Code Status:  DNR   Family Communication: Discussed in detail with the patient, all imaging results, lab results explained to the patient   Disposition Plan:  continue stepdown, cardiology consult     Consultants:  Pulmonary  Cardiology    Procedures:  None   Antibiotics: Anti-infectives    Start     Dose/Rate Route Frequency Ordered Stop   04/07/16 1815  meropenem (MERREM) 1 g in sodium chloride 0.9 % 100 mL IVPB     1 g 200 mL/hr over 30 Minutes Intravenous Every 12 hours 04/07/16 1800     04/07/16 1815  vancomycin (VANCOCIN) 1,250 mg in sodium chloride 0.9 % 250 mL IVPB     1,250 mg 166.7 mL/hr over 90 Minutes Intravenous Every 24 hours 04/07/16 1800     04/06/16 1500  levofloxacin (LEVAQUIN) IVPB 500 mg  Status:  Discontinued     500 mg 100 mL/hr over 60 Minutes Intravenous Every 24 hours 04/06/16 1407 04/07/16 1747         HPI/Subjective:  Patient complains of being anxious, feels that the Xanax needs to be increased   Objective: Vitals:   04/08/16 2009 04/09/16 0700 04/09/16 0858 04/09/16 0900  BP: (!) 141/58     Pulse:  81 91  90  Resp: 16 (!) 91  (!) 23  Temp:  98 F (36.7 C)    TempSrc:  Oral    SpO2: 93% 93% 96%   Weight:  81 kg (178 lb 9.6 oz)    Height:        Intake/Output Summary (Last 24 hours) at 04/09/16 0912 Last data filed at 04/09/16 0807  Gross per 24 hour  Intake              959 ml  Output             2900 ml  Net            -1941 ml    Exam:  Examination:  General exam: Appears calm and comfortable  Respiratory system: Clear to auscultation. Respiratory effort normal. Cardiovascular system: S1 & S2 heard, RRR. No JVD, murmurs, rubs, gallops or clicks. No pedal edema. Gastrointestinal system: Abdomen is nondistended, soft and nontender. No organomegaly or masses felt. Normal bowel sounds heard. Central nervous system: Alert and oriented. No focal neurological deficits. Extremities: Symmetric 5 x 5 power. Skin: No rashes, lesions or ulcers Psychiatry: Judgement and insight appear normal. Mood & affect appropriate.     Data Reviewed: I have personally reviewed following labs and imaging studies  Micro Results Recent Results (from the past 240 hour(s))  MRSA PCR Screening     Status: None   Collection Time: 04/06/16  3:43 PM  Result Value Ref Range Status  MRSA by PCR NEGATIVE NEGATIVE Final    Comment:        The GeneXpert MRSA Assay (FDA approved for NASAL specimens only), is one component of a comprehensive MRSA colonization surveillance program. It is not intended to diagnose MRSA infection nor to guide or monitor treatment for MRSA infections.   Respiratory Panel by PCR     Status: None   Collection Time: 04/07/16  2:30 PM  Result Value Ref Range Status   Adenovirus NOT DETECTED NOT DETECTED Final   Coronavirus 229E NOT DETECTED NOT DETECTED Final   Coronavirus HKU1 NOT DETECTED NOT DETECTED Final   Coronavirus NL63 NOT DETECTED NOT DETECTED Final   Coronavirus OC43 NOT DETECTED NOT DETECTED Final   Metapneumovirus NOT DETECTED NOT DETECTED Final    Rhinovirus / Enterovirus NOT DETECTED NOT DETECTED Final   Influenza A NOT DETECTED NOT DETECTED Final   Influenza B NOT DETECTED NOT DETECTED Final   Parainfluenza Virus 1 NOT DETECTED NOT DETECTED Final   Parainfluenza Virus 2 NOT DETECTED NOT DETECTED Final   Parainfluenza Virus 3 NOT DETECTED NOT DETECTED Final   Parainfluenza Virus 4 NOT DETECTED NOT DETECTED Final   Respiratory Syncytial Virus NOT DETECTED NOT DETECTED Final   Bordetella pertussis NOT DETECTED NOT DETECTED Final   Chlamydophila pneumoniae NOT DETECTED NOT DETECTED Final   Mycoplasma pneumoniae NOT DETECTED NOT DETECTED Final    Radiology Reports Ct Angio Chest Pe W Or Wo Contrast  Result Date: 04/07/2016 CLINICAL DATA:  Shortness of breath. EXAM: CT ANGIOGRAPHY CHEST WITH CONTRAST TECHNIQUE: Multidetector CT imaging of the chest was performed using the standard protocol during bolus administration of intravenous contrast. Multiplanar CT image reconstructions and MIPs were obtained to evaluate the vascular anatomy. CONTRAST:  70 cc Isovue 370. COMPARISON:  12/01/2007. FINDINGS: Cardiovascular: There is suboptimal opacification and streak artifact within segmental and subsegmental pulmonary arteries, as well as respiratory motion, limiting evaluation. No definite pulmonary embolus. Atherosclerotic calcification of the arterial vasculature with luminal narrowing of the left subclavian artery at its origin. Coronary artery calcification. Heart is enlarged. No pericardial effusion. Mediastinum/Nodes: Mediastinal lymph nodes measure up to 11 mm in the low right paratracheal station. Hilar lymph nodes measure up to 10 mm on the right. No axillary adenopathy. There may be a diverticulum off the distal esophagus. Lungs/Pleura: Image quality is rather degraded by respiratory motion. Moderate bilateral pleural effusions with collapse/ consolidation in the lower lobes, left greater than right. Calcified granulomas in the left lower lobe.  Emphysema. Airway is grossly unremarkable. Upper Abdomen: Visualized portions of the liver, gallbladder, adrenal glands, kidneys, spleen, pancreas, stomach and bowel are grossly unremarkable. No upper abdominal adenopathy. Musculoskeletal: No worrisome lytic or sclerotic lesions. Degenerative changes are seen in the spine. Review of the MIP images confirms the above findings. IMPRESSION: 1. Assessment for segmental and subsegmental pulmonary emboli is limited by suboptimal opacification, streak artifact and respiratory motion. No definite central or lobar pulmonary embolus. 2. Moderate bilateral pleural effusions with collapse/consolidation in the adjacent lower lobes, left greater than right. Pneumonia is not excluded. 3. Aortic atherosclerosis (ICD10-170.0). Coronary artery calcification. 4. Likely reactive mediastinal and hilar lymph nodes. Electronically Signed   By: Lorin Picket M.D.   On: 04/07/2016 16:05   Dg Chest Port 1 View  Result Date: 04/06/2016 CLINICAL DATA:  COPD and difficulty breathing EXAM: PORTABLE CHEST 1 VIEW COMPARISON:  01/19/2014 FINDINGS: Cardiac shadow is enlarged. This is a significant change from the prior exam. Calcified granuloma in the  left mid lung. A defibrillator is again seen and stable. Vascular congestion with interstitial edema is seen. IMPRESSION: Cardiomegaly with vascular congestion and interstitial edema. The changes of prior granulomatous disease. Electronically Signed   By: Inez Catalina M.D.   On: 04/06/2016 12:34     CBC  Recent Labs Lab 04/06/16 1205 04/07/16 0431 04/08/16 0336  WBC 12.3* 15.1* 31.3*  HGB 13.5 12.3 12.8  HCT 39.8 37.6 38.2  PLT 312 336 360  MCV 96.4 96.7 96.2  MCH 32.7 31.6 32.2  MCHC 33.9 32.7 33.5  RDW 14.7 14.9 14.9  LYMPHSABS 4.0  --   --   MONOABS 1.0  --   --   EOSABS 0.3  --   --   BASOSABS 0.0  --   --     Chemistries   Recent Labs Lab 04/06/16 1205 04/07/16 0431 04/08/16 0336 04/09/16 0604  NA 141 141 138  140  K 3.8 4.0 3.9 4.4  CL 112* 108 106 102  CO2 22 23 22 26   GLUCOSE 127* 154* 150* 151*  BUN 9 14 31* 47*  CREATININE 1.08* 1.32* 1.50* 1.59*  CALCIUM 9.0 9.1 9.0 9.4  AST  --   --  26 26  ALT  --   --  16 19  ALKPHOS  --   --  74 77  BILITOT  --   --  0.4 0.6   ------------------------------------------------------------------------------------------------------------------ estimated creatinine clearance is 32.5 mL/min (by C-G formula based on SCr of 1.59 mg/dL (H)). ------------------------------------------------------------------------------------------------------------------ No results for input(s): HGBA1C in the last 72 hours. ------------------------------------------------------------------------------------------------------------------ No results for input(s): CHOL, HDL, LDLCALC, TRIG, CHOLHDL, LDLDIRECT in the last 72 hours. ------------------------------------------------------------------------------------------------------------------ No results for input(s): TSH, T4TOTAL, T3FREE, THYROIDAB in the last 72 hours.  Invalid input(s): FREET3 ------------------------------------------------------------------------------------------------------------------ No results for input(s): VITAMINB12, FOLATE, FERRITIN, TIBC, IRON, RETICCTPCT in the last 72 hours.  Coagulation profile No results for input(s): INR, PROTIME in the last 168 hours.   Recent Labs  04/07/16 1234  DDIMER 2.46*    Cardiac Enzymes  Recent Labs Lab 04/06/16 1539 04/06/16 2050  TROPONINI <0.03 <0.03   ------------------------------------------------------------------------------------------------------------------ Invalid input(s): POCBNP   CBG:  Recent Labs Lab 04/07/16 2105 04/08/16 0726 04/08/16 1146 04/08/16 2043 04/09/16 0759  GLUCAP 138* 153* 154* 170* 235*       Studies: Ct Angio Chest Pe W Or Wo Contrast  Result Date: 04/07/2016 CLINICAL DATA:  Shortness of breath.  EXAM: CT ANGIOGRAPHY CHEST WITH CONTRAST TECHNIQUE: Multidetector CT imaging of the chest was performed using the standard protocol during bolus administration of intravenous contrast. Multiplanar CT image reconstructions and MIPs were obtained to evaluate the vascular anatomy. CONTRAST:  70 cc Isovue 370. COMPARISON:  12/01/2007. FINDINGS: Cardiovascular: There is suboptimal opacification and streak artifact within segmental and subsegmental pulmonary arteries, as well as respiratory motion, limiting evaluation. No definite pulmonary embolus. Atherosclerotic calcification of the arterial vasculature with luminal narrowing of the left subclavian artery at its origin. Coronary artery calcification. Heart is enlarged. No pericardial effusion. Mediastinum/Nodes: Mediastinal lymph nodes measure up to 11 mm in the low right paratracheal station. Hilar lymph nodes measure up to 10 mm on the right. No axillary adenopathy. There may be a diverticulum off the distal esophagus. Lungs/Pleura: Image quality is rather degraded by respiratory motion. Moderate bilateral pleural effusions with collapse/ consolidation in the lower lobes, left greater than right. Calcified granulomas in the left lower lobe. Emphysema. Airway is grossly unremarkable. Upper Abdomen: Visualized portions of the liver, gallbladder,  adrenal glands, kidneys, spleen, pancreas, stomach and bowel are grossly unremarkable. No upper abdominal adenopathy. Musculoskeletal: No worrisome lytic or sclerotic lesions. Degenerative changes are seen in the spine. Review of the MIP images confirms the above findings. IMPRESSION: 1. Assessment for segmental and subsegmental pulmonary emboli is limited by suboptimal opacification, streak artifact and respiratory motion. No definite central or lobar pulmonary embolus. 2. Moderate bilateral pleural effusions with collapse/consolidation in the adjacent lower lobes, left greater than right. Pneumonia is not excluded. 3. Aortic  atherosclerosis (ICD10-170.0). Coronary artery calcification. 4. Likely reactive mediastinal and hilar lymph nodes. Electronically Signed   By: Lorin Picket M.D.   On: 04/07/2016 16:05      Lab Results  Component Value Date   HGBA1C 6.6 (H) 02/26/2016   HGBA1C 7.8 (H) 05/30/2015   HGBA1C 7.0 (H) 12/29/2014   Lab Results  Component Value Date   LDLCALC 148 (H) 01/21/2014   CREATININE 1.59 (H) 04/09/2016       Scheduled Meds: . allopurinol  300 mg Oral Daily  . budesonide (PULMICORT) nebulizer solution  0.25 mg Nebulization BID  . clopidogrel  75 mg Oral Daily  . DULoxetine  60 mg Oral BID  . enoxaparin (LOVENOX) injection  40 mg Subcutaneous Q24H  . fluticasone  1 spray Each Nare Daily  . furosemide  60 mg Intravenous BID  . insulin aspart  0-9 Units Subcutaneous TID WC  . ketotifen  1 drop Both Eyes BID  . levalbuterol  1.25 mg Nebulization TID  . mouth rinse  15 mL Mouth Rinse BID  . meropenem (MERREM) IV  1 g Intravenous Q12H  . methylPREDNISolone (SOLU-MEDROL) injection  40 mg Intravenous Q6H  . metoprolol tartrate  25 mg Oral Daily  . sodium chloride flush  3 mL Intravenous Q12H  . vancomycin  1,250 mg Intravenous Q24H   Continuous Infusions:     LOS: 3 days    Time spent: >30 MINS    [facility]  [facility] Pager [phone]. If 7PM-7AM, please contact night-coverage at [email or web address], password [facility] 04/09/2016, 9:12 AM  LOS: 3 days

## 2016-04-09 NOTE — Progress Notes (Signed)
Name: Sydney Phillips MRN: [id number] DOB: 03-24-1936    ADMISSION DATE:  04/06/2016 CONSULTATION DATE:  04/08/16  REFERRING MD :  Dr. Allyson Sabal   CHIEF COMPLAINT:  SOB - "the air jumps out of my lungs into my stomach"   HISTORY OF PRESENT ILLNESS:  80 y/o F, current smoker (smoked since age 5, 1ppd) with complex medical history to include arthritis, chronic constipation, chronic back pain, depression / anxiety, fibromyalgia, DM, GERD, HTN, CAD s/p CABG, CHF / CM s/p AICD, MI, PAD s/p CEA, subclavian arterial stenosis and COPD who presented to [facility] on 10/22 with complaints of shortness of breath.    The patient reports she was in her usual state of health until the day of admit when she was attempting to get out of bed and she became acutely short of breath.  She denied known fevers, wheezing, weight gain (reports she weighs herself daily), nausea / vomiting and diarrhea.  She reports she does not wear O2 at home.  No recent changes in home.  Reports one dog.  Denies allergies.    The patient was recently admitted (9/12-9/15) with altered mental status & falls in the setting of dehydration and AKI.  Symptoms improved with volume resuscitation.  Due to AKI, she was taken off her diuretics and lisinopril.  She followed up with her primary MD on 9/20 and was euvolemic on exam at that time.    Initial ER evaluation 10/22 concerning for hypoxemic respiratory failure in the setting of decompensated CHF (rales on exam).  She required BiPAP therapy.  CXR was concerning for pulmonary edema.  She was simultaneously treated for a COPD exacerbation as well.  She was also noted to have L>R pleural effusions with compressive atelectasis on CXR.  PCCM called for evaluation of pleural effusions.    SUBJECTIVE:  Breathing feels better.  Occasional dry cough.  Still cannot lie flat.  Denies chest pain, hemoptysis, abd pain.   VITAL SIGNS: Temp:  [97.5 F (36.4 C)-98.1 F (36.7 C)] 98 F (36.7 C) (10/25  0700) Pulse Rate:  [81-93] 90 (10/25 0900) Resp:  [16-91] 23 (10/25 0900) BP: (141-154)/(58-73) 141/58 (10/24 2009) SpO2:  [93 %-99 %] 96 % (10/25 0858) Weight:  [81 kg (178 lb 9.6 oz)] 81 kg (178 lb 9.6 oz) (10/25 0700)  PHYSICAL EXAMINATION: General:  Obese elderly female in NAD Neuro:  AAOx4, speech clear, MAE  HEENT:  MM pink/moist, no jvd Cardiovascular:  s1s2 rrr, no m/r/g  Lungs:  Even/non-labored, few scattered crackles, diminished bases, 6L O2 per [place] Abdomen:  Obese soft, bsx4 active  Musculoskeletal:  No acute deformities  Skin:  Warm/dry, 1+ pitting edema    Recent Labs Lab 04/07/16 0431 04/08/16 0336 04/09/16 0604  NA 141 138 140  K 4.0 3.9 4.4  CL 108 106 102  CO2 23 22 26   BUN 14 31* 47*  CREATININE 1.32* 1.50* 1.59*  GLUCOSE 154* 150* 151*    Recent Labs Lab 04/06/16 1205 04/07/16 0431 04/08/16 0336  HGB 13.5 12.3 12.8  HCT 39.8 37.6 38.2  WBC 12.3* 15.1* 31.3*  PLT 312 336 360   Ct Angio Chest Pe W Or Wo Contrast  Result Date: 04/07/2016 CLINICAL DATA:  Shortness of breath. EXAM: CT ANGIOGRAPHY CHEST WITH CONTRAST TECHNIQUE: Multidetector CT imaging of the chest was performed using the standard protocol during bolus administration of intravenous contrast. Multiplanar CT image reconstructions and MIPs were obtained to evaluate the vascular anatomy. CONTRAST:  70 cc Isovue  370. COMPARISON:  12/01/2007. FINDINGS: Cardiovascular: There is suboptimal opacification and streak artifact within segmental and subsegmental pulmonary arteries, as well as respiratory motion, limiting evaluation. No definite pulmonary embolus. Atherosclerotic calcification of the arterial vasculature with luminal narrowing of the left subclavian artery at its origin. Coronary artery calcification. Heart is enlarged. No pericardial effusion. Mediastinum/Nodes: Mediastinal lymph nodes measure up to 11 mm in the low right paratracheal station. Hilar lymph nodes measure up to 10 mm on the  right. No axillary adenopathy. There may be a diverticulum off the distal esophagus. Lungs/Pleura: Image quality is rather degraded by respiratory motion. Moderate bilateral pleural effusions with collapse/ consolidation in the lower lobes, left greater than right. Calcified granulomas in the left lower lobe. Emphysema. Airway is grossly unremarkable. Upper Abdomen: Visualized portions of the liver, gallbladder, adrenal glands, kidneys, spleen, pancreas, stomach and bowel are grossly unremarkable. No upper abdominal adenopathy. Musculoskeletal: No worrisome lytic or sclerotic lesions. Degenerative changes are seen in the spine. Review of the MIP images confirms the above findings. IMPRESSION: 1. Assessment for segmental and subsegmental pulmonary emboli is limited by suboptimal opacification, streak artifact and respiratory motion. No definite central or lobar pulmonary embolus. 2. Moderate bilateral pleural effusions with collapse/consolidation in the adjacent lower lobes, left greater than right. Pneumonia is not excluded. 3. Aortic atherosclerosis (ICD10-170.0). Coronary artery calcification. 4. Likely reactive mediastinal and hilar lymph nodes. Electronically Signed   By: Lorin Picket M.D.   On: 04/07/2016 16:05    SIGNIFICANT EVENTS  10/22  Admit for CHF exacerbation   STUDIES:  ECHO 9/13 >> EF 40-45%, diffuse hypokinesis, grade 1 diastolic dysfunction, mild MR, PA peak 36 mmHg CTA chest 10/23>>>1. Assessment for segmental and subsegmental pulmonary emboli is limited by suboptimal opacification, streak artifact and respiratory motion. No definite central or lobar pulmonary embolus. 2. Moderate bilateral pleural effusions with collapse/consolidation in the adjacent lower lobes, left greater than right. Pneumonia is not excluded. 3. Aortic atherosclerosis (ICD10-170.0). Coronary artery calcification. 4. Likely reactive mediastinal and hilar lymph nodes.  ASSESSMENT / PLAN:  Acute on  chronic respiratory failure - multifactorial r/t pulmonary edema and pleural effusions in setting acute on chronic heart failure with underlying obstructive lung disease and some degree pulmonary HTN.     PLAN -              - F/U CXR now.             - No role thora at this time.             - continue aggressive diuresis as Scr and BP allow -- lasix 60mg  IV BID  - PRN albuterol              - Need PFT's upon discharge.             - Arrange for f/u with PCCM upon discharge.            - Titrate O2 for sat of 90-92% given pulmonary HTN             - Arrange for home O2.             - Active diureses  - consider cards input   Code status: DNR   Nickolas Madrid, NP 04/09/2016  9:35 AM Pager: [phone] or [phone]

## 2016-04-09 NOTE — Progress Notes (Signed)
Spoke with Dr. Allyson Sabal about holding IV lasix. Stated to hold pm dose and heart failure team would see in am. Cont to monitor pt. Carroll Kinds RN

## 2016-04-10 ENCOUNTER — Encounter (HOSPITAL_COMMUNITY): Payer: Self-pay | Admitting: Student

## 2016-04-10 DIAGNOSIS — I255 Ischemic cardiomyopathy: Secondary | ICD-10-CM

## 2016-04-10 DIAGNOSIS — I5043 Acute on chronic combined systolic (congestive) and diastolic (congestive) heart failure: Secondary | ICD-10-CM

## 2016-04-10 DIAGNOSIS — Z95810 Presence of automatic (implantable) cardiac defibrillator: Secondary | ICD-10-CM

## 2016-04-10 DIAGNOSIS — N179 Acute kidney failure, unspecified: Secondary | ICD-10-CM

## 2016-04-10 LAB — COMPREHENSIVE METABOLIC PANEL
ALK PHOS: 79 U/L (ref 38–126)
ALT: 25 U/L (ref 14–54)
AST: 25 U/L (ref 15–41)
Albumin: 3.9 g/dL (ref 3.5–5.0)
Anion gap: 12 (ref 5–15)
BILIRUBIN TOTAL: 0.5 mg/dL (ref 0.3–1.2)
BUN: 52 mg/dL — ABNORMAL HIGH (ref 6–20)
CALCIUM: 9.1 mg/dL (ref 8.9–10.3)
CO2: 29 mmol/L (ref 22–32)
CREATININE: 1.46 mg/dL — AB (ref 0.44–1.00)
Chloride: 97 mmol/L — ABNORMAL LOW (ref 101–111)
GFR, EST AFRICAN AMERICAN: 39 mL/min — AB (ref >60)
GFR, EST NON AFRICAN AMERICAN: 33 mL/min — AB (ref >60)
Glucose, Bld: 159 mg/dL — ABNORMAL HIGH (ref 65–99)
Potassium: 4.2 mmol/L (ref 3.5–5.1)
SODIUM: 138 mmol/L (ref 135–145)
TOTAL PROTEIN: 7.1 g/dL (ref 6.5–8.1)

## 2016-04-10 LAB — GLUCOSE, CAPILLARY
GLUCOSE-CAPILLARY: 202 mg/dL — AB (ref 65–99)
GLUCOSE-CAPILLARY: 175 mg/dL — AB (ref 65–99)
Glucose-Capillary: 221 mg/dL — ABNORMAL HIGH (ref 65–99)
Glucose-Capillary: 227 mg/dL — ABNORMAL HIGH (ref 65–99)

## 2016-04-10 MED ORDER — PREDNISONE 20 MG PO TABS
50.0000 mg | ORAL_TABLET | Freq: Every day | ORAL | Status: DC
  Administered 2016-04-11: 50 mg via ORAL
  Filled 2016-04-10: qty 2

## 2016-04-10 MED ORDER — POLYETHYLENE GLYCOL 3350 17 G PO PACK
17.0000 g | PACK | Freq: Every day | ORAL | Status: DC
  Filled 2016-04-10: qty 1

## 2016-04-10 NOTE — Progress Notes (Addendum)
[facility] PROGRESS NOTE  YAQUELYN CALLICUTT [id number] DOB: 15-Jun-1936 DOA: 04/06/2016   PCP: Laurey Morale, MD     Assessment/Plan: Principal Problem:   Acute respiratory distress Active Problems:   HLD (hyperlipidemia)   Essential hypertension   Coronary atherosclerosis   COPD (chronic obstructive pulmonary disease) with emphysema (HCC)   Biventricular implantable cardioverter-defibrillator in situ   Diabetes mellitus without complication (HCC)   Cardiomyopathy ([place])   Smoking   Acute kidney injury ([place])   CHF exacerbation (HCC)   Pleural effusion   Acute on chronic respiratory failure with hypoxia ([place])   80 y.o. female with medical history significant of 4 combination nonischemic ischemic cardiomyopathy status post CRT on [name] Lasix until one month ago, COPD and ongoing tobacco use, diabetes, hypertension, known left carotid stenosis, peripheral vascular disease, prior left subclavian and with stent, syncope presents to the emergency Department chief complaint of worsening shortness of breath. Initial evaluation reveals acute respiratory distress likely related to CHF exacerbation in the setting of mild COPD exacerbation.    She was admitted between 9/12-9/15. She was found to have altered mental status likely secondary to dehydration and acute kidney injury. Lasix and ACE inhibitor were discontinued.   Patient has continued to smoke until 2 days prior to admission.  ED Course: AMS provided her with 125 mg of Solu-Medrol and albuterol nebulizer. In the emergency department she displaced tachypnea respiratory distress, audible wheeze she is afebrile hemodynamically stable. She is placed on BiPAP at the time of admission is only mild increased work of breathing with conversation.  Assessment and plan ##1. Acute respiratory distress likely related to CHF exacerbation/COPD exacerbation  Likely related to recent discontinuation of Lasix due to renal function.  Patient was on 80 mg of Lasix daily this was discontinued about a month ago. BNP slightly elevated. Chest x-ray vascular congestion with interstitial edema. Recent echo reveals an EF of 40-45% and grade 1 diastolic dysfunction.In the ED she tachypnea.Reportedly oxygen saturation level in the low 70s when EMS arrived. Cont  Stepdown level of care Off  BiPAP, on HFNC at 8L>7L>2L Continue Lasix 60 mg IV twice a day, status post Zaroxolyn 5 mg 2 dose Change Solu-Medrol to prednisone -Nebulizers every 4 hours Continue treatment for healthcare associated pneumonia -Wean oxygen as able -Daily weights - CTA shows   Moderate bilateral pleural effusions with collapse/consolidation in the adjacent lower lobes, no  segmental and subsegmental PE but suboptimal study   Cardiology consultation for management of heart failure  diuresed 3.2 L overnight, still mildly fluid overloaded, we will continue iv diuresis till tomorrow and plan for discharge in the am    #2. Acute on chronic combined systolic/diastolic heart failure. Lasix recently discontinued.  Currently on Lasix IV, Zaroxolyn, beta blocker -daily weight -intake and output Continue to hold ACE inhibitor given risk of renal failure  creatinine trending up, continue diuresis cautiously    TSH and free T4 nl   #3. Acute on chronic COPD exacerbation. Not on oxygen at home  Continue Solu-Medrol albuterol nebulizer per EMS. Patient continues to smoke. Not on home oxygen. Son perceives a gradual worsening of disease over the last several months -Nebulizers as noted above -Wean BiPAP and oxygen as able -Ambulated in hall with oxygen trial when appropriate -Anti-tussive  HCAP coverage due to recent hospitalization   #4. Acute kidney injury. Creatinine 1.08.>1.5>1.59>1.46 Creatinine slightly up today,sob improved with IV Lasix Will continue diuresis   #5. Hypertension. Fair control  in the emergency department. -Continue Lopressor  #6.  CAD. Describes some chest tightness but denied chest pain. She is currently on Plavix. Initial troponin negative. EKG without acute findings.  troponin negative     7. Biventricular implantable cardioverter-defibrillator/combination of ischemic and nonischemic cardiomyopathy. See above. -Lasix as noted above -Daily weights  #8. Diabetes. Serum glucose 157-221 now. Home medications include metformin. Hemoglobin A1c 6.6 last month. Continue SSI -Hold metformin for now    #9. Anxiety. Appears stable at baseline. Home medications include Xanax and Cymbalta -Continue Xanax at a slightly lower dose -Contiue Cymbalta  #10 PVD, known severe, (previuosly asymptomatic) LSCA disease, now with "high grade" RICA disease/S/P angioplasty with stent, lt. subclavian 07/31/11/Left carotid stenosis Patient had arterial Doppler for Dr. Oneida Alar office  venous Doppler  no evidence of deep or superficial vein thrombosis involving the right and left lower extremities  #11  Chronic gout -Continue allopurinol   DVT prophylaxsis Lovenox   Code Status:  DNR   Family Communication: Discussed in detail with the patient, all imaging results, lab results explained to the patient   Disposition Plan:  change status to tele , cardiology consult     Consultants:  Pulmonary  Cardiology    Procedures:  None   Antibiotics: Anti-infectives    Start     Dose/Rate Route Frequency Ordered Stop   04/07/16 1815  meropenem (MERREM) 1 g in sodium chloride 0.9 % 100 mL IVPB     1 g 200 mL/hr over 30 Minutes Intravenous Every 12 hours 04/07/16 1800     04/07/16 1815  vancomycin (VANCOCIN) 1,250 mg in sodium chloride 0.9 % 250 mL IVPB     1,250 mg 166.7 mL/hr over 90 Minutes Intravenous Every 24 hours 04/07/16 1800     04/06/16 1500  levofloxacin (LEVAQUIN) IVPB 500 mg  Status:  Discontinued     500 mg 100 mL/hr over 60 Minutes Intravenous Every 24 hours 04/06/16 1407 04/07/16 1747          HPI/Subjective:  Patient complains of being anxious, wants to go home    Objective: Vitals:   04/10/16 0826 04/10/16 0850 04/10/16 0950 04/10/16 1145  BP:  (!) 100/55 119/78 131/83  Pulse:  85 82 74  Resp:  19 19 (!) 21  Temp:  98.7 F (37.1 C) 98.1 F (36.7 C) 97.9 F (36.6 C)  TempSrc:  Oral Oral Oral  SpO2: 94% 95% 96% 90%  Weight:      Height:        Intake/Output Summary (Last 24 hours) at 04/10/16 1334 Last data filed at 04/10/16 1223  Gross per 24 hour  Intake              835 ml  Output             4050 ml  Net            -3215 ml    Exam:  Examination:  General exam: Appears calm and comfortable  Respiratory system: Clear to auscultation. Respiratory effort normal. Cardiovascular system: S1 & S2 heard, RRR. No JVD, murmurs, rubs, gallops or clicks. No pedal edema. Gastrointestinal system: Abdomen is nondistended, soft and nontender. No organomegaly or masses felt. Normal bowel sounds heard. Central nervous system: Alert and oriented. No focal neurological deficits. Extremities: Symmetric 5 x 5 power. Skin: No rashes, lesions or ulcers Psychiatry: Judgement and insight appear normal. Mood & affect appropriate.     Data Reviewed: I have personally reviewed following labs  and imaging studies  Micro Results Recent Results (from the past 240 hour(s))  MRSA PCR Screening     Status: None   Collection Time: 04/06/16  3:43 PM  Result Value Ref Range Status   MRSA by PCR NEGATIVE NEGATIVE Final    Comment:        The GeneXpert MRSA Assay (FDA approved for NASAL specimens only), is one component of a comprehensive MRSA colonization surveillance program. It is not intended to diagnose MRSA infection nor to guide or monitor treatment for MRSA infections.   Respiratory Panel by PCR     Status: None   Collection Time: 04/07/16  2:30 PM  Result Value Ref Range Status   Adenovirus NOT DETECTED NOT DETECTED Final   Coronavirus 229E NOT DETECTED  NOT DETECTED Final   Coronavirus HKU1 NOT DETECTED NOT DETECTED Final   Coronavirus NL63 NOT DETECTED NOT DETECTED Final   Coronavirus OC43 NOT DETECTED NOT DETECTED Final   Metapneumovirus NOT DETECTED NOT DETECTED Final   Rhinovirus / Enterovirus NOT DETECTED NOT DETECTED Final   Influenza A NOT DETECTED NOT DETECTED Final   Influenza B NOT DETECTED NOT DETECTED Final   Parainfluenza Virus 1 NOT DETECTED NOT DETECTED Final   Parainfluenza Virus 2 NOT DETECTED NOT DETECTED Final   Parainfluenza Virus 3 NOT DETECTED NOT DETECTED Final   Parainfluenza Virus 4 NOT DETECTED NOT DETECTED Final   Respiratory Syncytial Virus NOT DETECTED NOT DETECTED Final   Bordetella pertussis NOT DETECTED NOT DETECTED Final   Chlamydophila pneumoniae NOT DETECTED NOT DETECTED Final   Mycoplasma pneumoniae NOT DETECTED NOT DETECTED Final    Radiology Reports Ct Angio Chest Pe W Or Wo Contrast  Result Date: 04/07/2016 CLINICAL DATA:  Shortness of breath. EXAM: CT ANGIOGRAPHY CHEST WITH CONTRAST TECHNIQUE: Multidetector CT imaging of the chest was performed using the standard protocol during bolus administration of intravenous contrast. Multiplanar CT image reconstructions and MIPs were obtained to evaluate the vascular anatomy. CONTRAST:  70 cc Isovue 370. COMPARISON:  12/01/2007. FINDINGS: Cardiovascular: There is suboptimal opacification and streak artifact within segmental and subsegmental pulmonary arteries, as well as respiratory motion, limiting evaluation. No definite pulmonary embolus. Atherosclerotic calcification of the arterial vasculature with luminal narrowing of the left subclavian artery at its origin. Coronary artery calcification. Heart is enlarged. No pericardial effusion. Mediastinum/Nodes: Mediastinal lymph nodes measure up to 11 mm in the low right paratracheal station. Hilar lymph nodes measure up to 10 mm on the right. No axillary adenopathy. There may be a diverticulum off the distal  esophagus. Lungs/Pleura: Image quality is rather degraded by respiratory motion. Moderate bilateral pleural effusions with collapse/ consolidation in the lower lobes, left greater than right. Calcified granulomas in the left lower lobe. Emphysema. Airway is grossly unremarkable. Upper Abdomen: Visualized portions of the liver, gallbladder, adrenal glands, kidneys, spleen, pancreas, stomach and bowel are grossly unremarkable. No upper abdominal adenopathy. Musculoskeletal: No worrisome lytic or sclerotic lesions. Degenerative changes are seen in the spine. Review of the MIP images confirms the above findings. IMPRESSION: 1. Assessment for segmental and subsegmental pulmonary emboli is limited by suboptimal opacification, streak artifact and respiratory motion. No definite central or lobar pulmonary embolus. 2. Moderate bilateral pleural effusions with collapse/consolidation in the adjacent lower lobes, left greater than right. Pneumonia is not excluded. 3. Aortic atherosclerosis (ICD10-170.0). Coronary artery calcification. 4. Likely reactive mediastinal and hilar lymph nodes. Electronically Signed   By: Lorin Picket M.D.   On: 04/07/2016 16:05   Dg Chest Portable  1 View  Result Date: 04/09/2016 CLINICAL DATA:  Following fusion is.  Shortness of breath. EXAM: PORTABLE CHEST 1 VIEW COMPARISON:  Single-view of the chest 04/06/2016. CT chest 04/07/2016. FINDINGS: Pulmonary edema seen on the comparison plain film has improved. Bilateral pleural effusions with basilar atelectasis, worse on the left, are also improved. Marked cardiomegaly is again seen. Calcified granuloma in the left lower lobe is noted. IMPRESSION: Improved congestive failure with decreased edema, effusions and atelectasis. Marked cardiomegaly. Electronically Signed   By: Inge Rise M.D.   On: 04/09/2016 10:03   Dg Chest Port 1 View  Result Date: 04/06/2016 CLINICAL DATA:  COPD and difficulty breathing EXAM: PORTABLE CHEST 1 VIEW  COMPARISON:  01/19/2014 FINDINGS: Cardiac shadow is enlarged. This is a significant change from the prior exam. Calcified granuloma in the left mid lung. A defibrillator is again seen and stable. Vascular congestion with interstitial edema is seen. IMPRESSION: Cardiomegaly with vascular congestion and interstitial edema. The changes of prior granulomatous disease. Electronically Signed   By: Inez Catalina M.D.   On: 04/06/2016 12:34     CBC  Recent Labs Lab 04/06/16 1205 04/07/16 0431 04/08/16 0336  WBC 12.3* 15.1* 31.3*  HGB 13.5 12.3 12.8  HCT 39.8 37.6 38.2  PLT 312 336 360  MCV 96.4 96.7 96.2  MCH 32.7 31.6 32.2  MCHC 33.9 32.7 33.5  RDW 14.7 14.9 14.9  LYMPHSABS 4.0  --   --   MONOABS 1.0  --   --   EOSABS 0.3  --   --   BASOSABS 0.0  --   --     Chemistries   Recent Labs Lab 04/06/16 1205 04/07/16 0431 04/08/16 0336 04/09/16 0604 04/10/16 0259  NA 141 141 138 140 138  K 3.8 4.0 3.9 4.4 4.2  CL 112* 108 106 102 97*  CO2 22 23 22 26 29   GLUCOSE 127* 154* 150* 151* 159*  BUN 9 14 31* 47* 52*  CREATININE 1.08* 1.32* 1.50* 1.59* 1.46*  CALCIUM 9.0 9.1 9.0 9.4 9.1  AST  --   --  26 26 25   ALT  --   --  16 19 25   ALKPHOS  --   --  74 77 79  BILITOT  --   --  0.4 0.6 0.5   ------------------------------------------------------------------------------------------------------------------ estimated creatinine clearance is 35.1 mL/min (by C-G formula based on SCr of 1.46 mg/dL (H)). ------------------------------------------------------------------------------------------------------------------ No results for input(s): HGBA1C in the last 72 hours. ------------------------------------------------------------------------------------------------------------------ No results for input(s): CHOL, HDL, LDLCALC, TRIG, CHOLHDL, LDLDIRECT in the last 72  hours. ------------------------------------------------------------------------------------------------------------------  Recent Labs  04/09/16 1048  TSH 0.354   ------------------------------------------------------------------------------------------------------------------ No results for input(s): VITAMINB12, FOLATE, FERRITIN, TIBC, IRON, RETICCTPCT in the last 72 hours.  Coagulation profile No results for input(s): INR, PROTIME in the last 168 hours.  No results for input(s): DDIMER in the last 72 hours.  Cardiac Enzymes  Recent Labs Lab 04/06/16 1539 04/06/16 2050  TROPONINI <0.03 <0.03   ------------------------------------------------------------------------------------------------------------------ Invalid input(s): POCBNP   CBG:  Recent Labs Lab 04/09/16 1153 04/09/16 1606 04/09/16 2012 04/10/16 0736 04/10/16 1142  GLUCAP 197* 157* 184* 221* 202*       Studies: Dg Chest Portable 1 View  Result Date: 04/09/2016 CLINICAL DATA:  Following fusion is.  Shortness of breath. EXAM: PORTABLE CHEST 1 VIEW COMPARISON:  Single-view of the chest 04/06/2016. CT chest 04/07/2016. FINDINGS: Pulmonary edema seen on the comparison plain film has improved. Bilateral pleural effusions with basilar atelectasis, worse on  the left, are also improved. Marked cardiomegaly is again seen. Calcified granuloma in the left lower lobe is noted. IMPRESSION: Improved congestive failure with decreased edema, effusions and atelectasis. Marked cardiomegaly. Electronically Signed   By: Inge Rise M.D.   On: 04/09/2016 10:03      Lab Results  Component Value Date   HGBA1C 6.6 (H) 02/26/2016   HGBA1C 7.8 (H) 05/30/2015   HGBA1C 7.0 (H) 12/29/2014   Lab Results  Component Value Date   LDLCALC 148 (H) 01/21/2014   CREATININE 1.46 (H) 04/10/2016       Scheduled Meds: . allopurinol  300 mg Oral Daily  . budesonide (PULMICORT) nebulizer solution  0.25 mg Nebulization BID  .  clopidogrel  75 mg Oral Daily  . DULoxetine  60 mg Oral BID  . enoxaparin (LOVENOX) injection  40 mg Subcutaneous Q24H  . fluticasone  1 spray Each Nare Daily  . furosemide  60 mg Intravenous BID  . insulin aspart  0-9 Units Subcutaneous TID WC  . ketotifen  1 drop Both Eyes BID  . levalbuterol  1.25 mg Nebulization TID  . mouth rinse  15 mL Mouth Rinse BID  . meropenem (MERREM) IV  1 g Intravenous Q12H  . methylPREDNISolone (SOLU-MEDROL) injection  40 mg Intravenous Q6H  . metoprolol tartrate  25 mg Oral Daily  . sodium chloride flush  3 mL Intravenous Q12H  . vancomycin  1,250 mg Intravenous Q24H   Continuous Infusions:     LOS: 4 days    Time spent: >30 MINS    [facility] : [facility]  [facility] Pager [phone]. If 7PM-7AM, please contact night-coverage at [email or web address], password [email or web address] 04/10/2016, 1:34 PM  LOS: 4 days

## 2016-04-10 NOTE — Evaluation (Signed)
Occupational Therapy Evaluation Patient Details Name: Sydney Phillips MRN: [id number] DOB: 1936-04-13 Today's Date: 04/10/2016    History of Present Illness Patient is a 80 y/o female with hx of COPD, HTN, PVD, carotid stenosis, vertigo, pacemaker, MI, fibroymyalgia, Dm, CHF, depression with anxiety presents with difficulty breathing. Found to have Acute respiratory distress likely related to CHF exacerbation in the setting of mild COPD exacerbation.    Clinical Impression   Pt with decline in function and safety with ADLs and ADL mobility with decreased strength, balance an endurance. Pt on 2 LO2 90-91% OS SATs at rest. O2 SATs did not decrease during ADL activity. Pt states that she mostly gets fatigued and SOB at home during home mgt tasks. Pt would benefit from acute OT services to address impairments to increase level of function and safety    Follow Up Recommendations  No OT follow up;Supervision - Intermittent    Equipment Recommendations  None recommended by OT    Recommendations for Other Services       Precautions / Restrictions Precautions Precautions: Fall Precaution Comments: watch 02 Restrictions Weight Bearing Restrictions: No      Mobility Bed Mobility Overal bed mobility: Needs Assistance Bed Mobility: Supine to Sit;Sit to Supine     Supine to sit: Supervision;HOB elevated Sit to supine: Supervision;HOB elevated   General bed mobility comments: no assist needed. HOB elevated at home due to risers. No dizziness.   Transfers Overall transfer level: Needs assistance Equipment used: Rolling walker (2 wheeled) Transfers: Sit to/from Stand Sit to Stand: Min guard         General transfer comment: Min guard for safety. No dizziness.     Balance Overall balance assessment: Needs assistance Sitting-balance support: Feet supported;No upper extremity supported Sitting balance-Leahy Scale: Fair     Standing balance support: During functional  activity Standing balance-Leahy Scale: Poor Standing balance comment: Reliant on BUEs for support in standing.                            ADL Overall ADL's : Needs assistance/impaired     Grooming: Wash/dry hands;Wash/dry face;Standing;Min guard   Upper Body Bathing: Set up;Sitting   Lower Body Bathing: Min guard;Sit to/from stand Lower Body Bathing Details (indicate cue type and reason): simulated, no dizziness Upper Body Dressing : Set up;Sitting   Lower Body Dressing: Min guard Lower Body Dressing Details (indicate cue type and reason): simulated, no dizziness Toilet Transfer: Min guard;RW;Comfort height toilet   Toileting- Clothing Manipulation and Hygiene: Min guard;Sit to/from stand   Tub/ Banker: 3 in 1;Min guard   Functional mobility during ADLs: Min guard General ADL Comments: Pt states that she mostly gets fatigued and SOB while standing during home mgt acitivities     Vision Vision Assessment?: No apparent visual deficits              Pertinent Vitals/Pain Pain Assessment: No/denies pain     Hand Dominance Right   Extremity/Trunk Assessment Upper Extremity Assessment Upper Extremity Assessment: Generalized weakness   Lower Extremity Assessment Lower Extremity Assessment: Defer to PT evaluation       Communication Communication Communication: No difficulties   Cognition Arousal/Alertness: Awake/alert Behavior During Therapy: WFL for tasks assessed/performed Overall Cognitive Status: Within Functional Limits for tasks assessed                     General Comments   pt very pleasant and  cooperative                 Home Living Family/patient expects to be discharged to:: Private residence Living Arrangements: Children Available Help at Discharge: Available PRN/intermittently Type of Home: House Home Access: Stairs to enter [employer] of Steps: 2 Entrance Stairs-Rails: None Home Layout: One  level     Bathroom Shower/Tub: Corporate investment banker: [place]: Environmental consultant - 2 wheels;Walker - 4 wheels;Cane - single point;Bedside commode;Tub bench;Grab bars - tub/shower   Additional Comments: Son works 4 miles away and can come home in the middle of the day if need be.      Prior Functioning/Environment Level of Independence: Independent with assistive device(s)        Comments: Uses rollator for ambulation, mainly household ambulator, cooks, cleans.         OT Problem List: Decreased strength;Decreased activity tolerance;Cardiopulmonary status limiting activity   OT Treatment/Interventions: Self-care/ADL training;DME and/or AE instruction;Therapeutic activities;Patient/family education;Energy conservation    OT Goals(Current goals can be found in the care plan section) Acute Rehab OT Goals Patient Stated Goal: to be able to breathe OT Goal Formulation: With patient Time For Goal Achievement: 04/17/16 Potential to Achieve Goals: Good ADL Goals Pt Will Perform Grooming: with set-up;with supervision;standing Pt Will Perform Lower Body Bathing: with supervision;with set-up;sit to/from stand Pt Will Perform Lower Body Dressing: with set-up;with supervision;sit to/from stand Pt Will Transfer to Toilet: with supervision;with modified independence;ambulating;grab bars Pt Will Perform Toileting - Clothing Manipulation and hygiene: with supervision;with modified independence;sit to/from stand Pt Will Perform Tub/Shower Transfer: with modified independence;with supervision;3 in 1;shower seat;tub bench;rolling walker;ambulating Additional ADL Goal #1: Energy conservation technique education for home mgt tasks  OT Frequency: Min 1X/week   Barriers to D/C:    no barriers                     End of Session Equipment Utilized During Treatment: Rolling walker;Gait belt;Other (comment) (3 in 1)  Activity Tolerance: Patient limited by  fatigue Patient left: in bed;with call bell/phone within reach;with bed alarm set   Time: 1315-1334 OT Time Calculation (min): 19 min Charges:  OT General Charges $OT Visit: 1 Procedure OT Evaluation $OT Eval Moderate Complexity: 1 Procedure G-Codes:    Britt Bottom 04/10/2016, 1:56 PM

## 2016-04-10 NOTE — Care Management Important Message (Signed)
Important Message  Patient Details  Name: Sydney Phillips MRN: [id number] Date of Birth: [birth year]   Medicare Important Message Given:  Yes    Nathen May 04/10/2016, 9:24 AM

## 2016-04-10 NOTE — Consult Note (Signed)
Cardiology Consult    Patient ID: Sydney Phillips MRN: [id number], DOB/AGE: [id number]   Admit date: 04/06/2016 Date of Consult: 04/10/2016  Primary Physician: Sydney Morale, MD Primary Cardiologist: Dr. Sallyanne Phillips Requesting Provider: Trish Phillips Reason for Consultation: CHF  Patient Profile    80 yo female with PMH ICM/NICM, s/p BiVi ICD, PAD, HLD who presented with worsening dyspnea over the past couple of days.   Past Medical History   Past Medical History:  Diagnosis Date  . Arthritis   . Automatic implantable cardioverter-defibrillator in situ   . Bronchitis   . CAD (coronary artery disease)    Currently angina free, no evidence of reversible ischemia  . Chronic back pain   . Chronic constipation   . Colon polyps 2003.  2015.   HP polyps 2003.  adnomas 2015.  required referal to [facility] for colonoscopic resection of flat polyps.   . Congestive heart failure (CHF) (HCC)    New York Heart Association functional class 2, diastolic dysfunction  . COPD (chronic obstructive pulmonary disease) ([place])   . Depression with anxiety    takes Cymbalta daily  . Diabetes mellitus without complication (HCC)    no meds  . Early cataracts, bilateral   . Fibromyalgia   . GERD (gastroesophageal reflux disease)    was on meds but was taken off;now watches what she eats  . Hemorrhoids   . History of kidney stones   . Hx of colonic polyps   . Hyperlipemia    takes Crestor daily  . Hypertension    takes Amlodipine and Metoprolol daily  . Insomnia   . LBBB (left bundle branch block)    Stress test 09/03/2010, EF 55  . Myocardial infarction   . PAD (peripheral artery disease) (HCC)    Carotid, subclavian, and lower extremity beds, currently not symptomatic  . Presence of combination internal cardiac defibrillator (ICD) and pacemaker   . Presence of permanent cardiac pacemaker   . S/P angioplasty with stent, lt. subclavian 07/31/11 08/01/2011  . Shortness of breath    when over  exerting self per pt.  . Subclavian arterial stenosis, lt, with PTA/STENT 07/31/11 08/01/2011  . Syncope 07/28/2011   EF - 50-55, moderate concentric hypertrophy in left ventricle  . Urinary incontinence   . Vertigo    takes Meclizine prn    Past Surgical History:  Procedure Laterality Date  . ABDOMINAL AORTAGRAM N/A 08/12/2013   Procedure: ABDOMINAL Maxcine Ham;  Surgeon: Elam Dutch, MD;  Location: [facility] CATH LAB;  Service: Cardiovascular;  Laterality: N/A;  . ABDOMINAL HYSTERECTOMY    . APPENDECTOMY    . BACK SURGERY  2012  . BIV ICD GENERTAOR CHANGE OUT Left 02/20/2012   Procedure: BIV ICD GENERTAOR CHANGE OUT;  Surgeon: [name], MD;  Location: [facility] CATH LAB;  Service: Cardiovascular;  Laterality: Left;  . CARDIAC CATHETERIZATION  12/01/2007   By Dr. Melvern Banker, left heart cath,   . CARDIAC DEFIBRILLATOR PLACEMENT  05/2008   By Dr Blanch Media, [employer] CANNOT HAVE MRI's  . CAROTID ANGIOGRAM N/A 07/31/2011   Procedure: CAROTID ANGIOGRAM;  Surgeon: Lorretta Harp, MD;  Location: [facility] CATH LAB;  Service: Cardiovascular;  Laterality: N/A;  carotid angiogram and possible Lt SCA PTA  . COLONOSCOPY W/ POLYPECTOMY  12/2013  . CORONARY ANGIOPLASTY    . ENDARTERECTOMY Left 11/08/2014   Procedure: LEFT CAROTID ENDARTERECTOMY WITH HEMASHIELD PATCH ANGIOPLASTY;  Surgeon: Elam Dutch, MD;  Location: [place];  Service: Vascular;  Laterality: Left;  .  ESOPHAGOGASTRODUODENOSCOPY N/A 01/20/2014   Procedure: ESOPHAGOGASTRODUODENOSCOPY (EGD);  Surgeon: Jerene Bears, MD;  Location[facility] ENDOSCOPY;  Service: Endoscopy;  Laterality: N/A;  . FEMORAL-POPLITEAL BYPASS GRAFT Right 10/12/2013   Procedure:   Femoral-Peroneal trunk  bypass with nonreversed greater saphenous vein graft;  Surgeon: Elam Dutch, MD;  Location: [place];  Service: Vascular;  Laterality: Right;  . GIVENS CAPSULE STUDY N/A 01/20/2014   Procedure: GIVENS CAPSULE STUDY;  Surgeon: Jerene Bears, MD;  Location: [place];  Service: Gastroenterology;   Laterality: N/A;  . INTRAOPERATIVE ARTERIOGRAM Right 10/12/2013   Procedure: INTRA OPERATIVE ARTERIOGRAM;  Surgeon: Elam Dutch, MD;  Location: [place];  Service: Vascular;  Laterality: Right;  . ORIF ELBOW FRACTURE  08/16/2011   Procedure: OPEN REDUCTION INTERNAL FIXATION (ORIF) ELBOW/OLECRANON FRACTURE;  Surgeon: Schuyler Amor, MD;  Location: [place];  Service: Orthopedics;  Laterality: Left;  . RENAL ANGIOGRAM N/A 08/12/2013   Procedure: RENAL ANGIOGRAM;  Surgeon: Elam Dutch, MD;  Location: [facility] CATH LAB;  Service: Cardiovascular;  Laterality: N/A;  . SUBCLAVIAN STENT PLACEMENT Left 07/31/2011   7x18 Genesis, balloon, with reduction of 90% ostial left subclavian artery stenosis to 0% with residual excellent flow  . TONSILLECTOMY    . TUBAL LIGATION       Allergies  Allergies  Allergen Reactions  . Potassium-Containing Compounds Other (See Comments)    Causes severe constipation  . Azithromycin Rash  . Codeine Itching  . Darvon Itching  . Erythromycin Rash  . Meloxicam Other (See Comments)    Unknown    . Norco [Hydrocodone-Acetaminophen] Itching  . Penicillins Rash  . Propoxyphene N-Acetaminophen Itching  . Rofecoxib Other (See Comments)    Unknown    . Rosuvastatin Other (See Comments)    cramps  . Statins Itching and Other (See Comments)    Sleeplessness  . Sulfa Antibiotics Rash    History of Present Illness    Sydney Phillips is a 80 female patient of Dr. Sallyanne Phillips with a PMH of both ICM/NICM s/p BiVi ICD, HTN, HLD and tobacco abuse. Her ejection fraction in 2009 was 20%. Catheterization revealed a 75% LAD and a 75% right coronary artery. She had a biventricular ICD placed at that time. Arteriogram in February 2013 showed a 70% right internal carotid artery stenosis. There was a 30-40% left carotid stenosis. There was a 90% left subclavian. Patient had stent of her left subclavian at that time. In August 2015 she had a low risk myocardial perfusion study with no  ischemia and EF 56%. Similar LVEF by echo.  She was last seen in the office on 5/17 and her device showed normal interrogation parameters with 99.6% BiVi pacing. Her weight was 183lbs and appeared euvolemic. She was still smoking and not taking any lipid lowering medications, so consideration was given to place on PCS K9 inhibitors.  She was admitted back in 9/17 with AMS and AKI. At discharge her lasix was held. States since that time she feels like she has been gaining fluid. Home dry weight is around 184lbs.  On Sunday, she eat dinner and then later that evening had a sudden onset of dyspnea. Attempted to use her home inhalers without much relief. EMS was called and gave her 125mg  solu-medrol and albuterol, which she reports helped significantly with her breathing. Denied any chest pain, palpitations, n/v or lower extremity edema.    In the ED her respiratory status declined and she required bipap. Admission labs showed stable electrolytes, Cr 1.08,  BNP 308, WBC of 12.3 and CXR with bilateral pleural effusions but unable to rule out PNA. She was admitted to Internal medicine and started on antibiotics and IV lasix.   Inpatient Medications    . allopurinol  300 mg Oral Daily  . budesonide (PULMICORT) nebulizer solution  0.25 mg Nebulization BID  . clopidogrel  75 mg Oral Daily  . DULoxetine  60 mg Oral BID  . enoxaparin (LOVENOX) injection  40 mg Subcutaneous Q24H  . fluticasone  1 spray Each Nare Daily  . furosemide  60 mg Intravenous BID  . insulin aspart  0-9 Units Subcutaneous TID WC  . ketotifen  1 drop Both Eyes BID  . levalbuterol  1.25 mg Nebulization TID  . mouth rinse  15 mL Mouth Rinse BID  . meropenem (MERREM) IV  1 g Intravenous Q12H  . methylPREDNISolone (SOLU-MEDROL) injection  40 mg Intravenous Q6H  . metoprolol tartrate  25 mg Oral Daily  . sodium chloride flush  3 mL Intravenous Q12H  . vancomycin  1,250 mg Intravenous Q24H    Family History    Family History    Problem Relation Age of Onset  . CAD Father   . Heart disease Father   . Hyperlipidemia Father   . Heart disease Mother   . Deep vein thrombosis Son   . Hyperlipidemia    . Colon cancer Maternal Grandmother   . Cancer Sister     ovarian  . Diabetes Sister   . Heart disease Sister   . Anesthesia problems Neg Hx   . Hypotension Neg Hx   . Malignant hyperthermia Neg Hx   . Pseudochol deficiency Neg Hx     Social History    Social History   Social History  . Marital status: Widowed    Spouse name: N/A  . Number of children: 3  . Years of education: N/A   Occupational History  . Not on file.   Social History Main Topics  . Smoking status: Current Every Day Smoker    Packs/day: 1.00    Years: 62.00    Types: Cigarettes  . Smokeless tobacco: Never Used     Comment: Down to 3-4 cigarettes per day  . Alcohol use No  . Drug use: No  . Sexual activity: No   Other Topics Concern  . Not on file   Social History Narrative   Ok to share information with medical POA, Son Gabriel Carina     Review of Systems    General:  No chills, fever, night sweats or weight changes.  Cardiovascular: See HPI Dermatological: No rash, lesions/masses Respiratory: No cough, ++ dyspnea Urologic: No hematuria, dysuria Abdominal:   No nausea, vomiting, diarrhea, bright red blood per rectum, melena, or hematemesis Neurologic:  No visual changes, wkns, changes in mental status. All other systems reviewed and are otherwise negative except as noted above.  Physical Exam    Blood pressure (!) 100/55, pulse 85, temperature 98.7 F (37.1 C), temperature source Oral, resp. rate 19, height 5\' 8"  (1.727 m), weight 175 lb (79.4 kg), SpO2 95 %.  General: Pleasant older WF, NAD Psych: Normal affect. Neuro: Alert and oriented X 3. Moves all extremities spontaneously. HEENT: Normal  Neck: Supple without bruits or JVD. Lungs:  Resp regular and unlabored, Bilateral wheezing, mild crackles. Heart: RRR  no s3, s4, or murmurs. Abdomen: Soft, non-tender, non-distended, BS + x 4.  Extremities: No clubbing, cyanosis or edema. DP/PT/Radials 2+ and equal bilaterally.  Labs  Troponin (Point of Care Test) No results for input(s): TROPIPOC in the last 72 hours. No results for input(s): CKTOTAL, CKMB, TROPONINI in the last 72 hours. Lab Results  Component Value Date   WBC 31.3 (H) 04/08/2016   HGB 12.8 04/08/2016   HCT 38.2 04/08/2016   MCV 96.2 04/08/2016   PLT 360 04/08/2016    Recent Labs Lab 04/10/16 0259  NA 138  K 4.2  CL 97*  CO2 29  BUN 52*  CREATININE 1.46*  CALCIUM 9.1  PROT 7.1  BILITOT 0.5  ALKPHOS 79  ALT 25  AST 25  GLUCOSE 159*   Lab Results  Component Value Date   CHOL 228 (H) 05/30/2015   HDL 33.30 (L) 05/30/2015   LDLCALC 148 (H) 01/21/2014   TRIG 262.0 (H) 05/30/2015   Lab Results  Component Value Date   DDIMER 2.46 (H) 04/07/2016     Radiology Studies    Ct Angio Chest Pe W Or Wo Contrast  Result Date: 04/07/2016 CLINICAL DATA:  Shortness of breath.  IMPRESSION: 1. Assessment for segmental and subsegmental pulmonary emboli is limited by suboptimal opacification, streak artifact and respiratory motion. No definite central or lobar pulmonary embolus. 2. Moderate bilateral pleural effusions with collapse/consolidation in the adjacent lower lobes, left greater than right. Pneumonia is not excluded. 3. Aortic atherosclerosis (ICD10-170.0). Coronary artery calcification. 4. Likely reactive mediastinal and hilar lymph nodes. Electronically Signed   By: Lorin Picket M.D.   On: 04/07/2016 16:05   Dg Chest Portable 1 View  Result Date: 04/09/2016 CLINICAL DATA:  Following fusion is.  Shortness of breath. EXAM: PORTABLE CHEST 1 VIEW COMPARISON:  Single-view of the chest 04/06/2016. CT chest 04/07/2016. FINDINGS: Pulmonary edema seen on the comparison plain film has improved. Bilateral pleural effusions with basilar atelectasis, worse on the left, are also  improved. Marked cardiomegaly is again seen. Calcified granuloma in the left lower lobe is noted. IMPRESSION: Improved congestive failure with decreased edema, effusions and atelectasis. Marked cardiomegaly. Electronically Signed   By: Inge Rise M.D.   On: 04/09/2016 10:03   Dg Chest Port 1 View  Result Date: 04/06/2016 CLINICAL DATA:  COPD and difficulty breathing EXAM: PORTABLE CHEST 1 VIEW COMPARISON:  01/19/2014 FINDINGS: Cardiac shadow is enlarged. This is a significant change from the prior exam. Calcified granuloma in the left mid lung. A defibrillator is again seen and stable. Vascular congestion with interstitial edema is seen. IMPRESSION: Cardiomegaly with vascular congestion and interstitial edema. The changes of prior granulomatous disease. Electronically Signed   By: Inez Catalina M.D.   On: 04/06/2016 12:34    ECG & Cardiac Imaging    EKG: SR, V-paced  Echo: 9/17  Study Conclusions  - Left ventricle: The cavity size was normal. Systolic function was   mildly to moderately reduced. The estimated ejection fraction was   in the range of 40% to 45%. Diffuse hypokinesis. Doppler   parameters are consistent with abnormal left ventricular   relaxation (grade 1 diastolic dysfunction). - Mitral valve: There was mild regurgitation. - Right ventricle: Pacer wire or catheter noted in right ventricle. - Pulmonary arteries: Systolic pressure was mildly increased. PA   peak pressure: 36 mm Hg (S).  Impressions:  - When compared to prior, EF is reduced (55%)  Assessment & Plan    80 yo female with PMH ICM/NICM, s/p BiVi ICD, PAD, HLD who presented with worsening dyspnea over the past couple of days.   1. Acute respiratory distress: Likely a combination  of combined CHF and COPD exacerbation. States she was on lasix 80mg  BID until her admission last month, when she developed AKI and this along with her ACEI was held at discharge. Last echo showed mildly reduced EF of 40-45%. On  admission her BNP was 300, CXR with bilateral pleural effusions. Has been on IV lasix 60mg  BID this admission with good UOP thus far. Cr has increased to 1.5 and lasix was held last night, with follow up Cr 1.46 today. She does continue to smoke, does not currently see a pulmonologist. Repeat CXr with improvement of effusion. Likely has some undiagnosed underlying COPD.  --Breathing has significantly improved. Weight is now 175lb which is below her stated dry weight. Believe she could transition to POs. Will need a home dose off diuretic as she is unable to maintain euvolemia without this.  -- Does not currently require home O2, would check her ambulating O2 sats.   2. ICM/NICM sp BiVi ICD: Denies any anginal symptoms. EKG shows SR with v pacing. Trop neg x2.   3. PAD: Known carotid artery disease. Stent to left subclavian. On plavix  4. AKI: 1.08 on admission 1.50>>1.46 today.   5. HLD: statin intolerant, considered PCS K9 inhibitors at last office visit.   6. Tobacco cessation: discussed the importance of cessation.   Barnet Pall, NP-C Pager [phone] 04/10/2016, 9:43 AM  The patient was seen, examined and discussed with Reino Bellis, NP-C and I agree with the above.   Mrs. Hartkopf is a 84 female patient of Dr. Sallyanne Phillips with a PMH of both ICM/NICM s/p BiVi ICD, HTN, HLD and tobacco abuse, LVEF 20% in 2013, moderate to severe diffuse CAD on a cath (75% LAD and a 75% RCA), s/p biventricular ICD placement in 2013. H/o PAD, 70% right internal carotid artery stenosis. There was a 30-40% left carotid stenosis. There was a 90% left subclavian. Patient had stent of her left subclavian at that time. In August 2015 she had a low risk myocardial perfusion study with no ischemia and EF 56%. Similar LVEF by echo. She was last seen in the office on 5/17 and her device showed normal interrogation parameters with 99.6% BiVi pacing. Her weight was 183lbs and appeared euvolemic. She was  still smoking and not taking any lipid lowering medications, so consideration was given to place on PCS K9 inhibitors. She was admitted back in 9/17 with AMS and AKI, lasix held as a result. She was admitted with acute on chronic combined systolic and diastolic CHF, restarted on lasix iv, diuresed 3.2 L overnight, still mildly fluid overloaded, we will continue iv diuresis till tomorrow and plan for discharge in the am.  Ena Dawley 04/10/2016

## 2016-04-10 NOTE — Evaluation (Signed)
Physical Therapy Evaluation Patient Details Name: Sydney Phillips MRN: [id number] DOB: 1936-02-01 Today's Date: 04/10/2016   History of Present Illness  Patient is a 80 y/o female with hx of COPD, HTN, PVD, carotid stenosis, vertigo, pacemaker, MI, fibroymyalgia, Dm, CHF, depression with anxiety presents with difficulty breathing. Found to have Acute respiratory distress likely related to CHF exacerbation in the setting of mild COPD exacerbation.   Clinical Impression  Patient presents with generalized weakness, decreased activity tolerance, dyspnea on exertion and impaired endurance s/p above. Tolerated gait training on 2-3L/min 02 and able to maintain oxygen saturation >90%. Pt not able to maintain 02 in 90s at rest on RA and numbers seem to worsen when laying down and improve with mobility. Explained importance of upright positioning and mobility. Pt has support from son at home except when he is at work. Will follow acutely to maximize independence and mobility and continue to wean pt off 02 as pt does not wear it at home.     Follow Up Recommendations Home health PT;Supervision - Intermittent    Equipment Recommendations  None recommended by PT    Recommendations for Other Services       Precautions / Restrictions Precautions Precautions: Fall Precaution Comments: watch 02 Restrictions Weight Bearing Restrictions: No      Mobility  Bed Mobility Overal bed mobility: Needs Assistance Bed Mobility: Supine to Sit;Sit to Supine     Supine to sit: Supervision;HOB elevated Sit to supine: Supervision;HOB elevated   General bed mobility comments: no assist needed. HOB elevated at home due to risers. No dizziness.   Transfers Overall transfer level: Needs assistance Equipment used: Rolling walker (2 wheeled) Transfers: Sit to/from Stand Sit to Stand: Min guard         General transfer comment: Min guard for safety. No dizziness.   Ambulation/Gait Ambulation/Gait  assistance: Min guard Ambulation Distance (Feet): 120 Feet Assistive device: Rolling walker (2 wheeled) Gait Pattern/deviations: Step-through pattern;Decreased stride length;Trunk flexed Gait velocity: decreased   General Gait Details: Slow, steady gait with cues for pursed lip breathing. Sp02 remained >90% on 2-3L/min 02. mild DOE.   Stairs            Wheelchair Mobility    Modified Rankin (Stroke Patients Only)       Balance Overall balance assessment: Needs assistance Sitting-balance support: Feet supported;No upper extremity supported Sitting balance-Leahy Scale: Fair     Standing balance support: During functional activity Standing balance-Leahy Scale: Poor Standing balance comment: Reliant on BUEs for support in standing.                             Pertinent Vitals/Pain Pain Assessment: No/denies pain    Home Living Family/patient expects to be discharged to:: Private residence Living Arrangements: Children (with son who works 7-3:30 ish )   Type of Home: House Home Access: Stairs to enter Entrance Stairs-Rails: None Technical brewer of Steps: 2 Home Layout: One level Home Equipment: Environmental consultant - 2 wheels;Walker - 4 wheels;Cane - single point;Bedside commode;Tub bench;Grab bars - tub/shower Additional Comments: Son works 4 miles away and can come home in the middle of the day if need be.    Prior Function Level of Independence: Independent with assistive device(s)         Comments: Uses rollator for ambulation, mainly household ambulator, cooks, cleans.      Hand Dominance   Dominant Hand: Right    Extremity/Trunk Assessment  Upper Extremity Assessment: Defer to OT evaluation           Lower Extremity Assessment: Generalized weakness         Communication   Communication: No difficulties  Cognition Arousal/Alertness: Awake/alert Behavior During Therapy: WFL for tasks assessed/performed Overall Cognitive Status:  Within Functional Limits for tasks assessed                      General Comments General comments (skin integrity, edema, etc.): Oxygen saturation on room air sitting EOB in high 80s, able to get to 90 with cues for purse dlip breathing but not maintain. Ambulated on 2-3L/min 02 Unity and Sp02 remained >90%. Dropped resting 02 to 3L/min 02 to attempt weaning. RN notified.     Exercises     Assessment/Plan    PT Assessment Patient needs continued PT services  PT Problem List Decreased strength;Decreased mobility;Cardiopulmonary status limiting activity;Decreased activity tolerance;Decreased balance          PT Treatment Interventions Therapeutic activities;DME instruction;Gait training;Therapeutic exercise;Patient/family education;Balance training;Functional mobility training;Stair training    PT Goals (Current goals can be found in the Care Plan section)  Acute Rehab PT Goals Patient Stated Goal: to be able to breathe PT Goal Formulation: With patient Time For Goal Achievement: 04/24/16 Potential to Achieve Goals: Good    Frequency Min 3X/week   Barriers to discharge Decreased caregiver support home alone for half the day    Co-evaluation               End of Session Equipment Utilized During Treatment: Gait belt;Oxygen Activity Tolerance: Patient tolerated treatment well Patient left: in bed;with call bell/phone within reach Nurse Communication: Mobility status         Time: 1017-1040 PT Time Calculation (min) (ACUTE ONLY): 23 min   Charges:   PT Evaluation $PT Eval Moderate Complexity: 1 Procedure PT Treatments $Gait Training: 8-22 mins   PT G Codes:        Dorine Duffey A Shirley Bolle 04/10/2016, 10:52 AM  Wray Kearns, PT, DPT [phone]

## 2016-04-11 DIAGNOSIS — F17200 Nicotine dependence, unspecified, uncomplicated: Secondary | ICD-10-CM

## 2016-04-11 DIAGNOSIS — I2510 Atherosclerotic heart disease of native coronary artery without angina pectoris: Secondary | ICD-10-CM

## 2016-04-11 DIAGNOSIS — I420 Dilated cardiomyopathy: Secondary | ICD-10-CM

## 2016-04-11 DIAGNOSIS — I10 Essential (primary) hypertension: Secondary | ICD-10-CM

## 2016-04-11 LAB — BASIC METABOLIC PANEL
Anion gap: 15 (ref 5–15)
BUN: 57 mg/dL — ABNORMAL HIGH (ref 6–20)
CHLORIDE: 89 mmol/L — AB (ref 101–111)
CO2: 32 mmol/L (ref 22–32)
CREATININE: 1.46 mg/dL — AB (ref 0.44–1.00)
Calcium: 9.5 mg/dL (ref 8.9–10.3)
GFR, EST AFRICAN AMERICAN: 39 mL/min — AB (ref >60)
GFR, EST NON AFRICAN AMERICAN: 33 mL/min — AB (ref >60)
Glucose, Bld: 172 mg/dL — ABNORMAL HIGH (ref 65–99)
Potassium: 3.6 mmol/L (ref 3.5–5.1)
SODIUM: 136 mmol/L (ref 135–145)

## 2016-04-11 LAB — GLUCOSE, CAPILLARY
GLUCOSE-CAPILLARY: 184 mg/dL — AB (ref 65–99)
Glucose-Capillary: 152 mg/dL — ABNORMAL HIGH (ref 65–99)

## 2016-04-11 MED ORDER — FUROSEMIDE 40 MG PO TABS: 40.0000 mg | ORAL_TABLET | Freq: Two times a day (BID) | ORAL | 1 refills | Status: DC

## 2016-04-11 MED ORDER — LEVALBUTEROL HCL 1.25 MG/0.5ML IN NEBU: 1.2500 mg | INHALATION_SOLUTION | Freq: Two times a day (BID) | RESPIRATORY_TRACT | Status: DC

## 2016-04-11 MED ORDER — LEVOFLOXACIN 750 MG PO TABS: 750.0000 mg | ORAL_TABLET | ORAL | 0 refills | Status: AC

## 2016-04-11 MED ORDER — ALPRAZOLAM 0.5 MG PO TABS: 0.5000 mg | ORAL_TABLET | Freq: Three times a day (TID) | ORAL | 0 refills | Status: DC | PRN

## 2016-04-11 MED ORDER — PREDNISONE 50 MG PO TABS: 50.0000 mg | ORAL_TABLET | Freq: Every day | ORAL | 0 refills | Status: AC

## 2016-04-11 MED ORDER — FUROSEMIDE 40 MG PO TABS: 40.0000 mg | ORAL_TABLET | Freq: Two times a day (BID) | ORAL | Status: DC

## 2016-04-11 NOTE — Progress Notes (Signed)
Pharmacy Antibiotic Note  Sydney Phillips is a 80 y.o. female admitted on 04/06/2016 with pneumonia.  She is now afebrile and no new culture data to indicate infection.  Portable chest on 10/25 revealed improved CHF and decreased edema, effusions and atelectasis.  Consider stopping IV antibiotics or changing to oral therapy to complete a 7-10 day course with Clairithromycin 250mg  every 12hr x 3 more days.  Plan: Vancomycin 1250 mg IV every 24 hours.  Goal trough 15-20 mcg/mL.  Meropenem 1000 mg IV every 12 hours  Height: 5\' 8"  (172.7 cm) Weight: 171 lb 14.4 oz (78 kg) IBW/kg (Calculated) : 63.9  Temp (24hrs), Avg:97.9 F (36.6 C), Min:97.8 F (36.6 C), Max:97.9 F (36.6 C)   Recent Labs Lab 04/06/16 1205 04/07/16 0431 04/08/16 0336 04/09/16 0604 04/10/16 0259  WBC 12.3* 15.1* 31.3*  --   --   CREATININE 1.08* 1.32* 1.50* 1.59* 1.46*    Estimated Creatinine Clearance: 34.8 mL/min (by C-G formula based on SCr of 1.46 mg/dL (H)).    Allergies  Allergen Reactions  . Potassium-Containing Compounds Other (See Comments)    Causes severe constipation  . Azithromycin Rash  . Codeine Itching  . Darvon Itching  . Erythromycin Rash  . Meloxicam Other (See Comments)    Unknown    . Norco [Hydrocodone-Acetaminophen] Itching  . Penicillins Rash  . Propoxyphene N-Acetaminophen Itching  . Rofecoxib Other (See Comments)    Unknown    . Rosuvastatin Other (See Comments)    cramps  . Statins Itching and Other (See Comments)    Sleeplessness  . Sulfa Antibiotics Rash    Thank you for allowing pharmacy to be a part of this patient's care.  Rober Minion [facility] 04/11/2016 10:16 AM

## 2016-04-11 NOTE — Progress Notes (Signed)
NURSING PROGRESS NOTE  Sydney Phillips [id number] Discharge Data: 04/11/2016 2:29 PM Attending Provider: No att. providers found PCP:FRY,STEPHEN A, MD     [name] to be D/C'd Home per Abrol,MD order.  Discussed with the patient and son the After Visit Summary and all questions fully answered. All IV's discontinued with no bleeding noted. All belongings returned to patient for patient to take home.   Last Vital Signs:  Blood pressure (!) 145/85, pulse 71, temperature 98.1 F (36.7 C), temperature source Oral, resp. rate 16, height 5\' 8"  (1.727 m), weight 78 kg (171 lb 14.4 oz), SpO2 93 %.  Discharge Medication List   Medication List    STOP taking these medications   metFORMIN 500 MG tablet Commonly known as:  GLUCOPHAGE     TAKE these medications   ACCU-CHEK FASTCLIX LANCETS Misc CHECK BLOOD SUGAR ONCE DAILY   ACCU-CHEK SMARTVIEW test strip Generic drug:  glucose blood CHECK BLOOD SUGAR ONCE DAILY   albuterol 108 (90 Base) MCG/ACT inhaler Commonly known as:  PROVENTIL HFA;VENTOLIN HFA Inhale 2 puffs into the lungs every 4 (four) hours as needed for wheezing or shortness of breath.   allopurinol 300 MG tablet Commonly known as:  ZYLOPRIM Take 1 tablet (300 mg total) by mouth daily.   ALPRAZolam 0.5 MG tablet Commonly known as:  XANAX Take 1 tablet (0.5 mg total) by mouth 3 (three) times daily as needed for anxiety or sleep. What changed:  See the new instructions.   clopidogrel 75 MG tablet Commonly known as:  PLAVIX Take 1 tablet (75 mg total) by mouth daily.   DULoxetine 60 MG capsule Commonly known as:  CYMBALTA TAKE ONE CAPSULE BY MOUTH TWICE DAILY What changed:  See the new instructions.   furosemide 40 MG tablet Commonly known as:  LASIX Take 1 tablet (40 mg total) by mouth 2 (two) times daily.   HYDROcodone-acetaminophen 5-325 MG tablet Commonly known as:  NORCO Take 1 tablet by mouth every 6 (six) hours as needed for moderate pain.    ketotifen 0.025 % ophthalmic solution Commonly known as:  ZADITOR Place 1 drop into both eyes 2 (two) times daily.   levofloxacin 750 MG tablet Commonly known as:  LEVAQUIN Take 1 tablet (750 mg total) by mouth every other day.   metoprolol tartrate 25 MG tablet Commonly known as:  LOPRESSOR Take 1 tablet (25 mg total) by mouth daily.   NASACORT AQ NA Place 2 sprays into the nose daily as needed (for congestion). Over the counter   nitroGLYCERIN 0.4 MG SL tablet Commonly known as:  NITROSTAT Place 1 tablet (0.4 mg total) under the tongue every 5 (five) minutes x 3 doses as needed for chest pain.   predniSONE 50 MG tablet Commonly known as:  DELTASONE Take 1 tablet (50 mg total) by mouth daily with breakfast. Start taking on:  04/12/2016

## 2016-04-11 NOTE — Progress Notes (Signed)
Patient Name: Sydney Phillips Date of Encounter: 04/11/2016  Primary Cardiologist: Dr. Delila Pereyra Problem List     Principal Problem:   Acute respiratory distress Active Problems:   HLD (hyperlipidemia)   Essential hypertension   Coronary atherosclerosis   COPD (chronic obstructive pulmonary disease) with emphysema (HCC)   Biventricular implantable cardioverter-defibrillator in situ   Diabetes mellitus without complication (HCC)   Cardiomyopathy ([place])   Smoking   Acute kidney injury ([place])   CHF exacerbation (HCC)   Pleural effusion   Acute on chronic respiratory failure with hypoxia (HCC)     Subjective   Feels well. Wants to go home today.   Inpatient Medications    Scheduled Meds: . allopurinol  300 mg Oral Daily  . budesonide (PULMICORT) nebulizer solution  0.25 mg Nebulization BID  . clopidogrel  75 mg Oral Daily  . DULoxetine  60 mg Oral BID  . enoxaparin (LOVENOX) injection  40 mg Subcutaneous Q24H  . fluticasone  1 spray Each Nare Daily  . furosemide  60 mg Intravenous BID  . insulin aspart  0-9 Units Subcutaneous TID WC  . ketotifen  1 drop Both Eyes BID  . levalbuterol  1.25 mg Nebulization BID  . mouth rinse  15 mL Mouth Rinse BID  . meropenem (MERREM) IV  1 g Intravenous Q12H  . metoprolol tartrate  25 mg Oral Daily  . polyethylene glycol  17 g Oral Daily  . predniSONE  50 mg Oral Q breakfast  . sodium chloride flush  3 mL Intravenous Q12H  . vancomycin  1,250 mg Intravenous Q24H   Continuous Infusions:   PRN Meds: sodium chloride, acetaminophen, ALPRAZolam, HYDROcodone-acetaminophen, ondansetron (ZOFRAN) IV, sodium chloride flush   Vital Signs    Vitals:   04/11/16 0001 04/11/16 0345 04/11/16 0909 04/11/16 0910  BP: 115/90 126/79    Pulse: 78 82    Resp: 18 20    Temp: 97.8 F (36.6 C) 97.9 F (36.6 C)    TempSrc: Oral Oral    SpO2: 94% 93% 97% 98%  Weight:  171 lb 14.4 oz (78 kg)    Height:        Intake/Output Summary  (Last 24 hours) at 04/11/16 0950 Last data filed at 04/11/16 0300  Gross per 24 hour  Intake              240 ml  Output             3000 ml  Net            -2760 ml   Filed Weights   04/09/16 0700 04/10/16 0418 04/11/16 0345  Weight: 178 lb 9.6 oz (81 kg) 175 lb (79.4 kg) 171 lb 14.4 oz (78 kg)    Physical Exam   GEN: Well nourished, well developed, in no acute distress.  HEENT: Grossly normal.  Neck: Supple, no JVD, carotid bruits, or masses. Cardiac: RRR, no murmurs, rubs, or gallops. No clubbing, cyanosis, edema.  Radials/DP/PT 2+ and equal bilaterally.  Respiratory:  Respirations regular and unlabored, clear to auscultation bilaterally. GI: Soft, nontender, nondistended, BS + x 4. MS: no deformity or atrophy. Skin: warm and dry, no rash. Neuro:  Strength and sensation are intact. Psych: AAOx3.  Normal affect.  Labs    CBC No results for input(s): WBC, NEUTROABS, HGB, HCT, MCV, PLT in the last 72 hours. Basic Metabolic Panel  Recent Labs  04/09/16 0604 04/10/16 0259  NA 140 138  K 4.4  4.2  CL 102 97*  CO2 26 29  GLUCOSE 151* 159*  BUN 47* 52*  CREATININE 1.59* 1.46*  CALCIUM 9.4 9.1   Liver Function Tests  Recent Labs  04/09/16 0604 04/10/16 0259  AST 26 25  ALT 19 25  ALKPHOS 77 79  BILITOT 0.6 0.5  PROT 6.7 7.1  ALBUMIN 3.8 3.9    Recent Labs  04/09/16 1048  TSH 0.354    Telemetry    SR V-paced - Personally Reviewed  ECG    N/A - Personally Reviewed  Radiology    Dg Chest Portable 1 View  Result Date: 04/09/2016 CLINICAL DATA:  Following fusion is.  Shortness of breath. EXAM: PORTABLE CHEST 1 VIEW COMPARISON:  Single-view of the chest 04/06/2016. CT chest 04/07/2016. FINDINGS: Pulmonary edema seen on the comparison plain film has improved. Bilateral pleural effusions with basilar atelectasis, worse on the left, are also improved. Marked cardiomegaly is again seen. Calcified granuloma in the left lower lobe is noted. IMPRESSION:  Improved congestive failure with decreased edema, effusions and atelectasis. Marked cardiomegaly. Electronically Signed   By: Inge Rise M.D.   On: 04/09/2016 10:03    Cardiac Studies     Patient Profile     80 yo female with PMH ICM/NICM, s/p BiVi ICD, PAD, HLD who presented with worsening dyspnea over the past couple of days.  Assessment & Plan    1. Acute respiratory distress: Likely a combination of combined CHF and COPD exacerbation. States she was on lasix 80mg  BID until her admission last month, when she developed AKI and this along with her ACEI was held at discharge. Last echo showed mildly reduced EF of 40-45%. On admission her BNP was 300, CXR with bilateral pleural effusions. Has been on IV lasix 60mg  BID this admission with good UOP thus far. She does continue to smoke, does not currently see a pulmonologist. Repeat CXR with improvement of effusion. Likely has some undiagnosed underlying COPD.  -- Has diuresed well on IV lasix. Morning labs pending. Will transition to lasix 40mg  PO BID today. Will need to come to the office next Friday for a BMET.  --Breathing has significantly improved. Weight is now 171lb which is below her stated dry weight.    2. ICM/NICM sp BiVi ICD: Denies any anginal symptoms. EKG shows SR with v pacing. Trop neg x2.   3. PAD: Known carotid artery disease. Stent to left subclavian. On plavix  4. AKI: 1.08 on admission 1.50>>1.46. BMET pending this morning.   5. HLD: statin intolerant, considered PCS K9 inhibitors at last office visit.   6. Tobacco cessation: discussed the importance of cessation.  Signed, Reino Bellis, NP  04/11/2016, 9:50 AM   The patient was seen, examined and discussed with Reino Bellis, NP-C and I agree with the above.   Sydney Phillips is a 72 female patient of Dr. Sallyanne Kuster with a PMH of both ICM/NICM s/p BiVi ICD, HTN, HLD and tobacco abuse, LVEF 20% in 2013, moderate to severe diffuse CAD on a cath (75% LAD  and a 75% RCA), s/p biventricular ICD placement in 2013. H/o PAD, 70% right internal carotid artery stenosis. There was a 30-40% left carotid stenosis. There was a 90% left subclavian. Patient had stent of her left subclavian at that time. In August 2015 she had a low risk myocardial perfusion study with no ischemia and EF 56%. Similar LVEF by echo. She was last seen in the office on 5/17 and her device showed normal interrogation  parameters with 99.6% BiVi pacing. Her weight was 183lbs and appeared euvolemic. She was still smoking and not taking any lipid lowering medications, so consideration was given to place on PCS K9 inhibitors. She was admitted back in 9/17 with AMS and AKI, lasix held as a result. She was admitted with acute on chronic combined systolic and diastolic CHF, restarted on lasix iv, diuresed 6 L in 2 days, now euvolemic, we will switch lasix to 40 mg po BID and discharge home. We will arrange for an outpatient follow up in 1 -2 weeks with BMP.   Ena Dawley, MD 04/11/2016

## 2016-04-11 NOTE — Progress Notes (Signed)
02 weaned this am. Pt sating at 96% on RA. Will continue to monitor. Etta Quill, RN

## 2016-04-11 NOTE — Progress Notes (Signed)
Pt a bit confused and agitated this morning. Pt upset because it too early to receive a breakfast tray (0400). Pt offered other snacks on the floor but refused, stating we were "not feeding her". Pt also refused to have labs drawn this morning.

## 2016-04-11 NOTE — Discharge Summary (Signed)
Physician Discharge Summary  Sydney Phillips MRN: [id number] DOB/AGE: [birth year] 80 y.o.  PCP: Laurey Morale, MD   Admit date: 04/06/2016 Discharge date: 04/11/2016  Discharge Diagnoses:    Principal Problem:   Acute respiratory distress Active Problems:   HLD (hyperlipidemia)   Essential hypertension   Coronary atherosclerosis   COPD (chronic obstructive pulmonary disease) with emphysema (HCC)   Biventricular implantable cardioverter-defibrillator in situ   Diabetes mellitus without complication (HCC)   Cardiomyopathy ([place])   Smoking   Acute kidney injury ([place])   CHF exacerbation (HCC)   Pleural effusion   Acute on chronic respiratory failure with hypoxia ([place])    Follow-up recommendations Follow-up with PCP in 3-5 days , including all  additional recommended appointments as below Follow-up CBC, CMP in 3-5 days Follow-up with cardiology in 1-2 weeks      Current Discharge Medication List    START taking these medications   Details  furosemide (LASIX) 40 MG tablet Take 1 tablet (40 mg total) by mouth 2 (two) times daily. Qty: 60 tablet, Refills: 1    levofloxacin (LEVAQUIN) 750 MG tablet Take 1 tablet (750 mg total) by mouth every other day. Qty: 3 tablet, Refills: 0    predniSONE (DELTASONE) 50 MG tablet Take 1 tablet (50 mg total) by mouth daily with breakfast. Qty: 5 tablet, Refills: 0      CONTINUE these medications which have CHANGED   Details  ALPRAZolam (XANAX) 0.5 MG tablet Take 1 tablet (0.5 mg total) by mouth 3 (three) times daily as needed for anxiety or sleep. Qty: 10 tablet, Refills: 0      CONTINUE these medications which have NOT CHANGED   Details  ACCU-CHEK FASTCLIX LANCETS MISC CHECK BLOOD SUGAR ONCE DAILY Qty: 102 each, Refills: 1    ACCU-CHEK SMARTVIEW test strip CHECK BLOOD SUGAR ONCE DAILY Qty: 100 each, Refills: 2    albuterol (PROVENTIL HFA;VENTOLIN HFA) 108 (90 Base) MCG/ACT inhaler Inhale 2 puffs into the lungs every 4  (four) hours as needed for wheezing or shortness of breath. Qty: 1 Inhaler, Refills: 5    allopurinol (ZYLOPRIM) 300 MG tablet Take 1 tablet (300 mg total) by mouth daily. Qty: 30 tablet, Refills: 11    DULoxetine (CYMBALTA) 60 MG capsule TAKE ONE CAPSULE BY MOUTH TWICE DAILY Qty: 60 capsule, Refills: 11    ketotifen (ZADITOR) 0.025 % ophthalmic solution Place 1 drop into both eyes 2 (two) times daily.    metoprolol tartrate (LOPRESSOR) 25 MG tablet Take 1 tablet (25 mg total) by mouth daily. Qty: 90 tablet, Refills: 3   Associated Diagnoses: Preventative health care    clopidogrel (PLAVIX) 75 MG tablet Take 1 tablet (75 mg total) by mouth daily. Qty: 90 tablet, Refills: 3    HYDROcodone-acetaminophen (NORCO) 5-325 MG tablet Take 1 tablet by mouth every 6 (six) hours as needed for moderate pain. Qty: 120 tablet, Refills: 0    nitroGLYCERIN (NITROSTAT) 0.4 MG SL tablet Place 1 tablet (0.4 mg total) under the tongue every 5 (five) minutes x 3 doses as needed for chest pain. Qty: 30 tablet, Refills: 11    Triamcinolone Acetonide (NASACORT AQ NA) Place 2 sprays into the nose daily as needed (for congestion). Over the counter       STOP taking these medications     metFORMIN (GLUCOPHAGE) 500 MG tablet          Discharge Condition:  Overall prognosis guarded  Discharge Instructions Get Medicines reviewed and adjusted: Please take all your  medications with you for your next visit with your Primary MD  Please request your Primary MD to go over all hospital tests and procedure/radiological results at the follow up, please ask your Primary MD to get all Hospital records sent to his/her office.  If you experience worsening of your admission symptoms, develop shortness of breath, life threatening emergency, suicidal or homicidal thoughts you must seek medical attention immediately by calling 911 or calling your MD immediately if symptoms less severe.  You must read complete  instructions/literature along with all the possible adverse reactions/side effects for all the Medicines you take and that have been prescribed to you. Take any new Medicines after you have completely understood and accpet all the possible adverse reactions/side effects.   Do not drive when taking Pain medications.   Do not take more than prescribed Pain, Sleep and Anxiety Medications  Special Instructions: If you have smoked or chewed Tobacco in the last 2 yrs please stop smoking, stop any regular Alcohol and or any Recreational drug use.  Wear Seat belts while driving.  Please note  You were cared for by a hospitalist during your hospital stay. Once you are discharged, your primary care physician will handle any further medical issues. Please note that NO REFILLS for any discharge medications will be authorized once you are discharged, as it is imperative that you return to your primary care physician (or establish a relationship with a primary care physician if you do not have one) for your aftercare needs so that they can reassess your need for medications and monitor your lab values.  Discharge Instructions    AMB Referral to [facility] Care Management    Complete by:  As directed    Reason for consult:  Post hospital follow up   Diagnoses of:   Heart Failure COPD/ Pneumonia Diabetes     Expected date of contact:  1-3 days (reserved for hospital discharges)   Please assign to community nurse for transition of care calls and assess for home visits. Questions please call:   Natividad Brood, RN BSN [facility] Liaison  [phone] business mobile phone Toll free office [phone]   Diet - low sodium heart healthy    Complete by:  As directed    Diet - low sodium heart healthy    Complete by:  As directed    Increase activity slowly    Complete by:  As directed    Increase activity slowly    Complete by:  As directed        Allergies  Allergen Reactions  .  Potassium-Containing Compounds Other (See Comments)    Causes severe constipation  . Azithromycin Rash  . Codeine Itching  . Darvon Itching  . Erythromycin Rash  . Meloxicam Other (See Comments)    Unknown    . Norco [Hydrocodone-Acetaminophen] Itching  . Penicillins Rash  . Propoxyphene N-Acetaminophen Itching  . Rofecoxib Other (See Comments)    Unknown    . Rosuvastatin Other (See Comments)    cramps  . Statins Itching and Other (See Comments)    Sleeplessness  . Sulfa Antibiotics Rash      Disposition: Home with home health   Consults:  Cardiology    Significant Diagnostic Studies:  Ct Angio Chest Pe W Or Wo Contrast  Result Date: 04/07/2016 CLINICAL DATA:  Shortness of breath. EXAM: CT ANGIOGRAPHY CHEST WITH CONTRAST TECHNIQUE: Multidetector CT imaging of the chest was performed using the standard protocol during bolus administration of  intravenous contrast. Multiplanar CT image reconstructions and MIPs were obtained to evaluate the vascular anatomy. CONTRAST:  70 cc Isovue 370. COMPARISON:  12/01/2007. FINDINGS: Cardiovascular: There is suboptimal opacification and streak artifact within segmental and subsegmental pulmonary arteries, as well as respiratory motion, limiting evaluation. No definite pulmonary embolus. Atherosclerotic calcification of the arterial vasculature with luminal narrowing of the left subclavian artery at its origin. Coronary artery calcification. Heart is enlarged. No pericardial effusion. Mediastinum/Nodes: Mediastinal lymph nodes measure up to 11 mm in the low right paratracheal station. Hilar lymph nodes measure up to 10 mm on the right. No axillary adenopathy. There may be a diverticulum off the distal esophagus. Lungs/Pleura: Image quality is rather degraded by respiratory motion. Moderate bilateral pleural effusions with collapse/ consolidation in the lower lobes, left greater than right. Calcified granulomas in the left lower lobe. Emphysema.  Airway is grossly unremarkable. Upper Abdomen: Visualized portions of the liver, gallbladder, adrenal glands, kidneys, spleen, pancreas, stomach and bowel are grossly unremarkable. No upper abdominal adenopathy. Musculoskeletal: No worrisome lytic or sclerotic lesions. Degenerative changes are seen in the spine. Review of the MIP images confirms the above findings. IMPRESSION: 1. Assessment for segmental and subsegmental pulmonary emboli is limited by suboptimal opacification, streak artifact and respiratory motion. No definite central or lobar pulmonary embolus. 2. Moderate bilateral pleural effusions with collapse/consolidation in the adjacent lower lobes, left greater than right. Pneumonia is not excluded. 3. Aortic atherosclerosis (ICD10-170.0). Coronary artery calcification. 4. Likely reactive mediastinal and hilar lymph nodes. Electronically Signed   By: Lorin Picket M.D.   On: 04/07/2016 16:05   Dg Chest Portable 1 View  Result Date: 04/09/2016 CLINICAL DATA:  Following fusion is.  Shortness of breath. EXAM: PORTABLE CHEST 1 VIEW COMPARISON:  Single-view of the chest 04/06/2016. CT chest 04/07/2016. FINDINGS: Pulmonary edema seen on the comparison plain film has improved. Bilateral pleural effusions with basilar atelectasis, worse on the left, are also improved. Marked cardiomegaly is again seen. Calcified granuloma in the left lower lobe is noted. IMPRESSION: Improved congestive failure with decreased edema, effusions and atelectasis. Marked cardiomegaly. Electronically Signed   By: Inge Rise M.D.   On: 04/09/2016 10:03   Dg Chest Port 1 View  Result Date: 04/06/2016 CLINICAL DATA:  COPD and difficulty breathing EXAM: PORTABLE CHEST 1 VIEW COMPARISON:  01/19/2014 FINDINGS: Cardiac shadow is enlarged. This is a significant change from the prior exam. Calcified granuloma in the left mid lung. A defibrillator is again seen and stable. Vascular congestion with interstitial edema is seen.  IMPRESSION: Cardiomegaly with vascular congestion and interstitial edema. The changes of prior granulomatous disease. Electronically Signed   By: Inez Catalina M.D.   On: 04/06/2016 12:34     2-D echo   Filed Weights   04/09/16 0700 04/10/16 0418 04/11/16 0345  Weight: 81 kg (178 lb 9.6 oz) 79.4 kg (175 lb) 78 kg (171 lb 14.4 oz)     Microbiology: Recent Results (from the past 240 hour(s))  MRSA PCR Screening     Status: None   Collection Time: 04/06/16  3:43 PM  Result Value Ref Range Status   MRSA by PCR NEGATIVE NEGATIVE Final    Comment:        The GeneXpert MRSA Assay (FDA approved for NASAL specimens only), is one component of a comprehensive MRSA colonization surveillance program. It is not intended to diagnose MRSA infection nor to guide or monitor treatment for MRSA infections.   Respiratory Panel by PCR  Status: None   Collection Time: 04/07/16  2:30 PM  Result Value Ref Range Status   Adenovirus NOT DETECTED NOT DETECTED Final   Coronavirus 229E NOT DETECTED NOT DETECTED Final   Coronavirus HKU1 NOT DETECTED NOT DETECTED Final   Coronavirus NL63 NOT DETECTED NOT DETECTED Final   Coronavirus OC43 NOT DETECTED NOT DETECTED Final   Metapneumovirus NOT DETECTED NOT DETECTED Final   Rhinovirus / Enterovirus NOT DETECTED NOT DETECTED Final   Influenza A NOT DETECTED NOT DETECTED Final   Influenza B NOT DETECTED NOT DETECTED Final   Parainfluenza Virus 1 NOT DETECTED NOT DETECTED Final   Parainfluenza Virus 2 NOT DETECTED NOT DETECTED Final   Parainfluenza Virus 3 NOT DETECTED NOT DETECTED Final   Parainfluenza Virus 4 NOT DETECTED NOT DETECTED Final   Respiratory Syncytial Virus NOT DETECTED NOT DETECTED Final   Bordetella pertussis NOT DETECTED NOT DETECTED Final   Chlamydophila pneumoniae NOT DETECTED NOT DETECTED Final   Mycoplasma pneumoniae NOT DETECTED NOT DETECTED Final       Blood Culture    Component Value Date/Time   SDES URINE, RANDOM  02/26/2016 1255   SPECREQUEST NONE 02/26/2016 1255   CULT NO GROWTH 02/26/2016 1255   REPTSTATUS 02/27/2016 FINAL 02/26/2016 1255      Labs: Results for orders placed or performed during the hospital encounter of 04/06/16 (from the past 48 hour(s))  Glucose, capillary     Status: Abnormal   Collection Time: 04/09/16  4:06 PM  Result Value Ref Range   Glucose-Capillary 157 (H) 65 - 99 mg/dL  Glucose, capillary     Status: Abnormal   Collection Time: 04/09/16  8:12 PM  Result Value Ref Range   Glucose-Capillary 184 (H) 65 - 99 mg/dL  Comprehensive metabolic panel     Status: Abnormal   Collection Time: 04/10/16  2:59 AM  Result Value Ref Range   Sodium 138 135 - 145 mmol/L   Potassium 4.2 3.5 - 5.1 mmol/L   Chloride 97 (L) 101 - 111 mmol/L   CO2 29 22 - 32 mmol/L   Glucose, Bld 159 (H) 65 - 99 mg/dL   BUN 52 (H) 6 - 20 mg/dL   Creatinine, Ser 1.46 (H) 0.44 - 1.00 mg/dL   Calcium 9.1 8.9 - 10.3 mg/dL   Total Protein 7.1 6.5 - 8.1 g/dL   Albumin 3.9 3.5 - 5.0 g/dL   AST 25 15 - 41 U/L   ALT 25 14 - 54 U/L   Alkaline Phosphatase 79 38 - 126 U/L   Total Bilirubin 0.5 0.3 - 1.2 mg/dL   GFR calc non Af Amer 33 (L) >60 mL/min   GFR calc Af Amer 39 (L) >60 mL/min    Comment: (NOTE) The eGFR has been calculated using the CKD EPI equation. This calculation has not been validated in all clinical situations. eGFR's persistently <60 mL/min signify possible Chronic Kidney Disease.    Anion gap 12 5 - 15  Glucose, capillary     Status: Abnormal   Collection Time: 04/10/16  7:36 AM  Result Value Ref Range   Glucose-Capillary 221 (H) 65 - 99 mg/dL  Glucose, capillary     Status: Abnormal   Collection Time: 04/10/16 11:42 AM  Result Value Ref Range   Glucose-Capillary 202 (H) 65 - 99 mg/dL  Glucose, capillary     Status: Abnormal   Collection Time: 04/10/16  4:39 PM  Result Value Ref Range   Glucose-Capillary 175 (H) 65 - 99 mg/dL  Glucose, capillary     Status: Abnormal    Collection Time: 04/10/16  8:00 PM  Result Value Ref Range   Glucose-Capillary 227 (H) 65 - 99 mg/dL  Glucose, capillary     Status: Abnormal   Collection Time: 04/11/16  8:25 AM  Result Value Ref Range   Glucose-Capillary 152 (H) 65 - 99 mg/dL  Glucose, capillary     Status: Abnormal   Collection Time: 04/11/16 11:09 AM  Result Value Ref Range   Glucose-Capillary 184 (H) 65 - 99 mg/dL     Lipid Panel     Component Value Date/Time   CHOL 228 (H) 05/30/2015 1452   TRIG 262.0 (H) 05/30/2015 1452   HDL 33.30 (L) 05/30/2015 1452   CHOLHDL 7 05/30/2015 1452   VLDL 52.4 (H) 05/30/2015 1452   LDLCALC 148 (H) 01/21/2014 0541   LDLDIRECT 152.0 05/30/2015 1452     Lab Results  Component Value Date   HGBA1C 6.6 (H) 02/26/2016   HGBA1C 7.8 (H) 05/30/2015   HGBA1C 7.0 (H) 12/29/2014        HPI :  80 y/o F, current smoker (smoked since age 46, 1ppd) with complex medical history to include arthritis, chronic constipation, chronic back pain, depression / anxiety, fibromyalgia, DM, GERD, HTN, CAD s/p CABG, CHF / CM s/p AICD, MI, PAD s/p CEA, subclavian arterial stenosis and COPD who presented to [facility] on 10/22 with complaints of shortness of breath.    The patient reports she was in her usual state of health until the day of admit when she was attempting to get out of bed and she became acutely short of breath.  She denied known fevers, wheezing, weight gain (reports she weighs herself daily), nausea / vomiting and diarrhea.  She reports she does not wear O2 at home.  No recent changes in home.  Reports one dog.  Denies allergies.    The patient was recently admitted (9/12-9/15) with altered mental status & falls in the setting of dehydration and AKI.  Symptoms improved with volume resuscitation.  Due to AKI, she was taken off her diuretics and lisinopril.  She followed up with her primary MD on 9/20 and was euvolemic on exam at that time.    Initial ER evaluation 10/22 concerning for  hypoxemic respiratory failure in the setting of decompensated CHF (rales on exam).  She required BiPAP therapy.  CXR was concerning for pulmonary edema.  She was simultaneously treated for a COPD exacerbation as well.  She was also noted to have L>R pleural effusions with compressive atelectasis on CXR.  PCCM called for evaluation of pleural effusions  HOSPITAL COURSE:     #1. Acute respiratory distress likely related to CHF exacerbation/COPD exacerbation Likely related to recent discontinuation of Lasix due to renal function. Patient was on 80 mg of Lasix daily this was discontinued about a month ago. BNP slightly elevated. Chest x-ray vascular congestion with interstitial edema. Recent echo reveals an EF of 40-45% and grade 1 diastolic dysfunction.In the ED she tachypnea.Reportedly oxygen saturation level in the low 70s when EMS arrived. Patient admitted to step down RequiredBiPAP for 48 hours, transitioned to   HFNC at 8L>7L>2L. Still requiring 2 L of oxygen with ambulation Treated with Lasix 60 mg IV twice a day, Zaroxolyn Treated with  IV Solu-Medrol for COPD, now switched to  Prednisone for another 5 days -Nebulizers every 4 hours Started on broad-spectrum antibiotics   for healthcare associated pneumonia, now switched to levofloxacin -Wean oxygen as  able -Daily weights Her weight was 183lbs and appeared euvolemic. - CTA shows   Moderate bilateral pleural effusions with collapse/consolidation in the adjacent lower lobes, no  segmental and subsegmental PE but suboptimal study   Cardiology  consulted for management of heart failure restarted on lasix iv, diuresed 6 L in 2 days, now euvolemic, we will switch lasix to 40 mg po BID and discharge home. We will arrange for an outpatient follow up in 1 -2 weeks with BMP.      #2. Acute on chronic combined systolic/diastolic heart failure. Lasix recently discontinued last admission for acute kidney injury.  Restarted on Lasix IV, Zaroxolyn,  beta blocker for CHF exacerbation -daily weight -intake and output Continue to hold ACE inhibitor given risk of renal failure Patient continued to have fluctuation in her renal function, therefore this needs to be monitored closely in the outpatient setting   TSH and free T4 nl   #3. Acute on chronic COPD exacerbation. Not on oxygen at home  Continue Solu-Medrol albuterol nebulizer per EMS. Patient continues to smoke. Not on home oxygen. Son perceives a gradual worsening of disease over the last several months -Nebulizers as noted above -Wean BiPAP and oxygen as able -Anti-tussive  HCAP coverage due to recent hospitalization, now switched to levofloxacin   #4. Acute kidney injury. Creatinine 1.08.>1.5>1.59>1.46>1.46 Creatinine slightly up today,sob improved with IV Lasix Will continue diuresis with by mouth Lasix   #5. Hypertension. Fair control in the emergency department. -Continue Lopressor  #6. CAD. Describes some chest tightness but denied chest pain. She is currently on Plavix. Initial troponin negative. EKG without acute findings.  troponin negative     7. Biventricular implantable cardioverter-defibrillator/combination of ischemic and nonischemic cardiomyopathy. See above. -Lasix as noted above -Daily weights  #8. Diabetes. Serum glucose 157-221 now. Home medications include metformin. Hemoglobin A1c 6.6 last month. Continue SSI -Hold metformin until renal function normalizes    #9. Anxiety. Appears stable at baseline. Home medications include Xanax and Cymbalta -ContinueXanax at a slightly lower dose -ContiueCymbalta  #10 PVD, known severe, (previuosly asymptomatic) LSCA disease, now with "high grade" RICA disease/S/P angioplasty with stent, lt. subclavian 07/31/11/Left carotid stenosis Patient had arterial Doppler for Dr. Oneida Alar office  venous Doppler  no evidence of deep or superficial vein thrombosis involving the right and left lower  extremities  #11  Chronic gout -Continue allopurinol   Discharge Exam:   Blood pressure (!) 145/85, pulse 71, temperature 98.1 F (36.7 C), temperature source Oral, resp. rate 16, height '5\' 8"'  (1.727 m), weight 78 kg (171 lb 14.4 oz), SpO2 93 %.      Follow-up Information    [facility] .   Why:  Registered Nurse, Physical Therapy Contact information: [place] [address] [phone]        Laurey Morale, MD. Schedule an appointment as soon as possible for a visit today.   Specialty:  Family Medicine Why:  hospital follow up Contact information: [place] [address] [phone]        [name], MD. Schedule an appointment as soon as possible for a visit in 1 week(s).   Specialty:  Cardiology Why:  hospital follow up Contact information: [address] [place] [address] [phone]           Signed: Reyne Dumas 04/11/2016, 12:11 PM        Time spent >45 mins

## 2016-04-11 NOTE — Progress Notes (Signed)
SATURATION QUALIFICATIONS: (This note is used to comply with regulatory documentation for home oxygen)  Patient Saturations on Room Air at Rest = 91%  Patient Saturations on Room Air while Ambulating = 76%  Patient Saturations on 2 Liters of oxygen while Ambulating = 94%  Please briefly explain why patient needs home oxygen:

## 2016-04-13 DIAGNOSIS — J441 Chronic obstructive pulmonary disease with (acute) exacerbation: Secondary | ICD-10-CM | POA: Diagnosis not present

## 2016-04-13 DIAGNOSIS — E119 Type 2 diabetes mellitus without complications: Secondary | ICD-10-CM | POA: Diagnosis not present

## 2016-04-13 DIAGNOSIS — I2510 Atherosclerotic heart disease of native coronary artery without angina pectoris: Secondary | ICD-10-CM | POA: Diagnosis not present

## 2016-04-13 DIAGNOSIS — I429 Cardiomyopathy, unspecified: Secondary | ICD-10-CM | POA: Diagnosis not present

## 2016-04-13 DIAGNOSIS — I5043 Acute on chronic combined systolic (congestive) and diastolic (congestive) heart failure: Secondary | ICD-10-CM | POA: Diagnosis not present

## 2016-04-13 DIAGNOSIS — I110 Hypertensive heart disease with heart failure: Secondary | ICD-10-CM | POA: Diagnosis not present

## 2016-04-13 DIAGNOSIS — Z7902 Long term (current) use of antithrombotics/antiplatelets: Secondary | ICD-10-CM | POA: Diagnosis not present

## 2016-04-13 DIAGNOSIS — E785 Hyperlipidemia, unspecified: Secondary | ICD-10-CM | POA: Diagnosis not present

## 2016-04-13 DIAGNOSIS — Z95810 Presence of automatic (implantable) cardiac defibrillator: Secondary | ICD-10-CM | POA: Diagnosis not present

## 2016-04-13 DIAGNOSIS — J9621 Acute and chronic respiratory failure with hypoxia: Secondary | ICD-10-CM | POA: Diagnosis not present

## 2016-04-13 DIAGNOSIS — Z7951 Long term (current) use of inhaled steroids: Secondary | ICD-10-CM | POA: Diagnosis not present

## 2016-04-13 DIAGNOSIS — Z5181 Encounter for therapeutic drug level monitoring: Secondary | ICD-10-CM | POA: Diagnosis not present

## 2016-04-14 ENCOUNTER — Telehealth: Payer: Self-pay | Admitting: Family Medicine

## 2016-04-14 ENCOUNTER — Other Ambulatory Visit: Payer: Self-pay

## 2016-04-14 ENCOUNTER — Telehealth (HOSPITAL_COMMUNITY): Payer: Self-pay | Admitting: Surgery

## 2016-04-14 DIAGNOSIS — E119 Type 2 diabetes mellitus without complications: Secondary | ICD-10-CM | POA: Diagnosis not present

## 2016-04-14 DIAGNOSIS — J9621 Acute and chronic respiratory failure with hypoxia: Secondary | ICD-10-CM | POA: Diagnosis not present

## 2016-04-14 DIAGNOSIS — Z7951 Long term (current) use of inhaled steroids: Secondary | ICD-10-CM | POA: Diagnosis not present

## 2016-04-14 DIAGNOSIS — Z5181 Encounter for therapeutic drug level monitoring: Secondary | ICD-10-CM | POA: Diagnosis not present

## 2016-04-14 DIAGNOSIS — E785 Hyperlipidemia, unspecified: Secondary | ICD-10-CM | POA: Diagnosis not present

## 2016-04-14 DIAGNOSIS — I5043 Acute on chronic combined systolic (congestive) and diastolic (congestive) heart failure: Secondary | ICD-10-CM | POA: Diagnosis not present

## 2016-04-14 DIAGNOSIS — I429 Cardiomyopathy, unspecified: Secondary | ICD-10-CM | POA: Diagnosis not present

## 2016-04-14 DIAGNOSIS — I2510 Atherosclerotic heart disease of native coronary artery without angina pectoris: Secondary | ICD-10-CM | POA: Diagnosis not present

## 2016-04-14 DIAGNOSIS — Z95810 Presence of automatic (implantable) cardiac defibrillator: Secondary | ICD-10-CM | POA: Diagnosis not present

## 2016-04-14 DIAGNOSIS — J441 Chronic obstructive pulmonary disease with (acute) exacerbation: Secondary | ICD-10-CM | POA: Diagnosis not present

## 2016-04-14 DIAGNOSIS — Z7902 Long term (current) use of antithrombotics/antiplatelets: Secondary | ICD-10-CM | POA: Diagnosis not present

## 2016-04-14 DIAGNOSIS — I110 Hypertensive heart disease with heart failure: Secondary | ICD-10-CM | POA: Diagnosis not present

## 2016-04-14 NOTE — Telephone Encounter (Signed)
Sydney Phillips with [facility] would like verbal orders for home health PT 2 wk / 2 1 wk /1

## 2016-04-14 NOTE — Telephone Encounter (Signed)
Please set up these orders

## 2016-04-14 NOTE — Telephone Encounter (Signed)
Heart Failure Nurse Navigator Post Discharge Telephone Call  I called to check on Sydney Phillips since her recent hospitalization.  She tells me that she is doing "well overall".  Yet also says she is currently having a bowl of chicken noodle soup.  I have discouraged her from eating these type of high sodium foods--and she said it was "easy and what I had".  I reminded her that she should do what she can to adhere to a 2000 mg Sodium diet.  She also says that she has not yet filled her Metoprolol prescription and has not taken any since hospital discharge.  She does say that she will get it later today or tomorrow.  I encouraged her to have the prescription filled as soon as possible.  She tells me that she weighed this am and her weight was 167 lbs versus discharge weight of 172 lbs.  She denies any increased SOB or swelling.  She has a follow-up appt with Dr Sarajane Jews on Wednesday at 3:30pm.

## 2016-04-14 NOTE — Patient Outreach (Signed)
[facility]) Care Management  04/14/16  Sydney Phillips 25-Mar-1936 [id number]  Subjective: Successful outreach completed with patient. Patient identification verified. RNCM provided education about [facility] Care Management Services and RNCM role and patient open to outreach and future home visit.  Objective:  Assessment: Patient stated she has been doing pretty good since hospital discharge. Stated that her son was able to pick up her metoprolol today so she now has all of her medications and is taking them as prescribed. She reported that she was discharged back in September without any fluid pills and she was overhydrated this admission.  She reports that she is weighing herself every day. Weight today was 167.7.  Her discharge weigh was 171 lbs and 14.4 oz.  She stated that she is taking her lasix and manages her medications on her own. Patient denies any pain, any shortness of breath and no swelling in her hands, feet or ankles. Patient able to verbalize signs and symptoms of increased fluid to watch for.   She stated that she has not yet called her heart doctor to schedule a follow up appointment but will schedule an appointment. RNCM educated that she needs to follow up with Dr. Sallyanne Kuster within a week of discharge. Also encouraged to schedule follow up with Dr. Sarajane Jews (PCP) for a post discharge follow up.   Patient denies having any questions or concerns. She asked RNCM what the purpose of the call was and [facility] provided education on [place] Management. Advised would like to do a home visit as well to complete assessment. She stated she was agreeable to let the nurse come on out and "get it over with." She stated she does not feel she needs the outreach, but is agreeable to continued outreach during transition of care for 30 days.  RNCM provided contact information and information and education about 24 hour nurse line. Encouraged to call with any questions or concerns.  Plan:  Home visit scheduled for next week.  [place] R. Lafreda Casebeer, RN, BSN, [place] Management Coordinator [phone]

## 2016-04-15 ENCOUNTER — Other Ambulatory Visit: Payer: Self-pay | Admitting: Family Medicine

## 2016-04-15 NOTE — Telephone Encounter (Signed)
I spoke with [place] and gave verbal orders for PT.

## 2016-04-16 ENCOUNTER — Encounter: Payer: Self-pay | Admitting: Family Medicine

## 2016-04-16 ENCOUNTER — Ambulatory Visit (INDEPENDENT_AMBULATORY_CARE_PROVIDER_SITE_OTHER): Payer: Medicare Other | Admitting: Family Medicine

## 2016-04-16 VITALS — BP 138/76 | HR 90 | Temp 97.2°F | Ht 68.0 in | Wt 171.0 lb

## 2016-04-16 DIAGNOSIS — Z5181 Encounter for therapeutic drug level monitoring: Secondary | ICD-10-CM | POA: Diagnosis not present

## 2016-04-16 DIAGNOSIS — I2510 Atherosclerotic heart disease of native coronary artery without angina pectoris: Secondary | ICD-10-CM

## 2016-04-16 DIAGNOSIS — Z7902 Long term (current) use of antithrombotics/antiplatelets: Secondary | ICD-10-CM | POA: Diagnosis not present

## 2016-04-16 DIAGNOSIS — I10 Essential (primary) hypertension: Secondary | ICD-10-CM

## 2016-04-16 DIAGNOSIS — I255 Ischemic cardiomyopathy: Secondary | ICD-10-CM | POA: Diagnosis not present

## 2016-04-16 DIAGNOSIS — R42 Dizziness and giddiness: Secondary | ICD-10-CM

## 2016-04-16 DIAGNOSIS — E876 Hypokalemia: Secondary | ICD-10-CM

## 2016-04-16 DIAGNOSIS — E785 Hyperlipidemia, unspecified: Secondary | ICD-10-CM | POA: Diagnosis not present

## 2016-04-16 DIAGNOSIS — J438 Other emphysema: Secondary | ICD-10-CM

## 2016-04-16 DIAGNOSIS — I110 Hypertensive heart disease with heart failure: Secondary | ICD-10-CM | POA: Diagnosis not present

## 2016-04-16 DIAGNOSIS — I429 Cardiomyopathy, unspecified: Secondary | ICD-10-CM | POA: Diagnosis not present

## 2016-04-16 DIAGNOSIS — J9621 Acute and chronic respiratory failure with hypoxia: Secondary | ICD-10-CM | POA: Diagnosis not present

## 2016-04-16 DIAGNOSIS — I5043 Acute on chronic combined systolic (congestive) and diastolic (congestive) heart failure: Secondary | ICD-10-CM | POA: Diagnosis not present

## 2016-04-16 DIAGNOSIS — Z95810 Presence of automatic (implantable) cardiac defibrillator: Secondary | ICD-10-CM | POA: Diagnosis not present

## 2016-04-16 DIAGNOSIS — Z7951 Long term (current) use of inhaled steroids: Secondary | ICD-10-CM | POA: Diagnosis not present

## 2016-04-16 DIAGNOSIS — J441 Chronic obstructive pulmonary disease with (acute) exacerbation: Secondary | ICD-10-CM | POA: Diagnosis not present

## 2016-04-16 DIAGNOSIS — E119 Type 2 diabetes mellitus without complications: Secondary | ICD-10-CM | POA: Diagnosis not present

## 2016-04-16 NOTE — Progress Notes (Signed)
Pre visit review using our clinic review tool, if applicable. No additional management support is needed unless otherwise documented below in the visit note. 

## 2016-04-16 NOTE — Progress Notes (Signed)
   Subjective:    Patient ID: Sydney Phillips, female    DOB: [birth year], 80 y.o.   MRN: [id number]  HPI Here to follow up a hospital stay from 04-06-16 to 04-11-16 for an acute exacerbation of CHF. It was unclear if she had a pneumonia or not but she was treated with antibiotics. Her ECHO in September showed an LVEF of 40-45%. She had been on Lasix but this was stopped prior to her admission. She is back on Lasix now and is doing much better. Her hands and feet are no longer swollen and she is not SOB.    Review of Systems  Constitutional: Positive for fatigue. Negative for chills, diaphoresis and fever.  Respiratory: Negative.   Cardiovascular: Negative.   Gastrointestinal: Negative.   Neurological: Negative.        Objective:   Physical Exam  Constitutional: She is oriented to person, place, and time. She appears well-developed and well-nourished. No distress.  Neck: No thyromegaly present.  Cardiovascular: Normal rate, regular rhythm, normal heart sounds and intact distal pulses.   Pulmonary/Chest: Effort normal and breath sounds normal. No respiratory distress. She has no wheezes. She has no rales.  Musculoskeletal: She exhibits no edema.  Lymphadenopathy:    She has no cervical adenopathy.  Neurological: She is alert and oriented to person, place, and time.          Assessment & Plan:  Her CHF is well managed now. She is taking Lasix 40 mg bid and we will keep this dosing the same. Get labs today including a BMET to follow her renal status. Check a CBC.  Laurey Morale, MD

## 2016-04-17 LAB — CBC WITH DIFFERENTIAL/PLATELET
BASOS ABS: 0.1 10*3/uL (ref 0.0–0.1)
Basophils Relative: 0.2 % (ref 0.0–3.0)
EOS ABS: 0 10*3/uL (ref 0.0–0.7)
Eosinophils Relative: 0 % (ref 0.0–5.0)
HEMATOCRIT: 49.4 % — AB (ref 36.0–46.0)
HEMOGLOBIN: 16.4 g/dL — AB (ref 12.0–15.0)
LYMPHS PCT: 5 % — AB (ref 12.0–46.0)
Lymphs Abs: 1.2 10*3/uL (ref 0.7–4.0)
MCHC: 33.1 g/dL (ref 30.0–36.0)
MCV: 96.5 fl (ref 78.0–100.0)
MONOS PCT: 3.1 % (ref 3.0–12.0)
Monocytes Absolute: 0.8 10*3/uL (ref 0.1–1.0)
Neutro Abs: 22.4 10*3/uL — ABNORMAL HIGH (ref 1.4–7.7)
Neutrophils Relative %: 91.7 % — ABNORMAL HIGH (ref 43.0–77.0)
PLATELETS: 338 10*3/uL (ref 150.0–400.0)
RBC: 5.12 Mil/uL — AB (ref 3.87–5.11)
RDW: 15.1 % (ref 11.5–15.5)

## 2016-04-17 LAB — BASIC METABOLIC PANEL
BUN: 58 mg/dL — ABNORMAL HIGH (ref 6–23)
CALCIUM: 10.2 mg/dL (ref 8.4–10.5)
CO2: 30 meq/L (ref 19–32)
CREATININE: 1.92 mg/dL — AB (ref 0.40–1.20)
Chloride: 91 mEq/L — ABNORMAL LOW (ref 96–112)
GFR: 26.83 mL/min — AB (ref >60.00)
Glucose, Bld: 290 mg/dL — ABNORMAL HIGH (ref 70–99)
Potassium: 3.9 mEq/L (ref 3.5–5.1)
SODIUM: 135 meq/L (ref 135–145)

## 2016-04-18 ENCOUNTER — Telehealth: Payer: Self-pay | Admitting: Family Medicine

## 2016-04-18 DIAGNOSIS — E785 Hyperlipidemia, unspecified: Secondary | ICD-10-CM | POA: Diagnosis not present

## 2016-04-18 DIAGNOSIS — Z7951 Long term (current) use of inhaled steroids: Secondary | ICD-10-CM | POA: Diagnosis not present

## 2016-04-18 DIAGNOSIS — Z5181 Encounter for therapeutic drug level monitoring: Secondary | ICD-10-CM | POA: Diagnosis not present

## 2016-04-18 DIAGNOSIS — E119 Type 2 diabetes mellitus without complications: Secondary | ICD-10-CM | POA: Diagnosis not present

## 2016-04-18 DIAGNOSIS — Z95810 Presence of automatic (implantable) cardiac defibrillator: Secondary | ICD-10-CM | POA: Diagnosis not present

## 2016-04-18 DIAGNOSIS — J9621 Acute and chronic respiratory failure with hypoxia: Secondary | ICD-10-CM | POA: Diagnosis not present

## 2016-04-18 DIAGNOSIS — I2510 Atherosclerotic heart disease of native coronary artery without angina pectoris: Secondary | ICD-10-CM | POA: Diagnosis not present

## 2016-04-18 DIAGNOSIS — I5043 Acute on chronic combined systolic (congestive) and diastolic (congestive) heart failure: Secondary | ICD-10-CM | POA: Diagnosis not present

## 2016-04-18 DIAGNOSIS — Z7902 Long term (current) use of antithrombotics/antiplatelets: Secondary | ICD-10-CM | POA: Diagnosis not present

## 2016-04-18 DIAGNOSIS — J441 Chronic obstructive pulmonary disease with (acute) exacerbation: Secondary | ICD-10-CM | POA: Diagnosis not present

## 2016-04-18 DIAGNOSIS — I429 Cardiomyopathy, unspecified: Secondary | ICD-10-CM | POA: Diagnosis not present

## 2016-04-18 DIAGNOSIS — I110 Hypertensive heart disease with heart failure: Secondary | ICD-10-CM | POA: Diagnosis not present

## 2016-04-18 NOTE — Telephone Encounter (Signed)
Pt scheduled  

## 2016-04-18 NOTE — Telephone Encounter (Signed)
I spoke pt and put a hold on 04/23/2016 at 4:15 for follow up on labs. Can you put pt on the schedule please?

## 2016-04-19 NOTE — Telephone Encounter (Signed)
This encounter was created in error - please disregard.

## 2016-04-21 ENCOUNTER — Telehealth: Payer: Self-pay | Admitting: Cardiovascular Disease

## 2016-04-21 DIAGNOSIS — I5043 Acute on chronic combined systolic (congestive) and diastolic (congestive) heart failure: Secondary | ICD-10-CM | POA: Diagnosis not present

## 2016-04-21 DIAGNOSIS — Z95810 Presence of automatic (implantable) cardiac defibrillator: Secondary | ICD-10-CM | POA: Diagnosis not present

## 2016-04-21 DIAGNOSIS — J441 Chronic obstructive pulmonary disease with (acute) exacerbation: Secondary | ICD-10-CM | POA: Diagnosis not present

## 2016-04-21 DIAGNOSIS — I2510 Atherosclerotic heart disease of native coronary artery without angina pectoris: Secondary | ICD-10-CM | POA: Diagnosis not present

## 2016-04-21 DIAGNOSIS — Z7902 Long term (current) use of antithrombotics/antiplatelets: Secondary | ICD-10-CM | POA: Diagnosis not present

## 2016-04-21 DIAGNOSIS — J9621 Acute and chronic respiratory failure with hypoxia: Secondary | ICD-10-CM | POA: Diagnosis not present

## 2016-04-21 DIAGNOSIS — E785 Hyperlipidemia, unspecified: Secondary | ICD-10-CM | POA: Diagnosis not present

## 2016-04-21 DIAGNOSIS — I110 Hypertensive heart disease with heart failure: Secondary | ICD-10-CM | POA: Diagnosis not present

## 2016-04-21 DIAGNOSIS — E119 Type 2 diabetes mellitus without complications: Secondary | ICD-10-CM | POA: Diagnosis not present

## 2016-04-21 DIAGNOSIS — Z5181 Encounter for therapeutic drug level monitoring: Secondary | ICD-10-CM | POA: Diagnosis not present

## 2016-04-21 DIAGNOSIS — I429 Cardiomyopathy, unspecified: Secondary | ICD-10-CM | POA: Diagnosis not present

## 2016-04-21 DIAGNOSIS — Z7951 Long term (current) use of inhaled steroids: Secondary | ICD-10-CM | POA: Diagnosis not present

## 2016-04-21 NOTE — Telephone Encounter (Signed)
F/u Message ° °Pt returning RN call. Please call back to discuss  °

## 2016-04-21 NOTE — Telephone Encounter (Signed)
Left msg to call and advised I will attempt to call her back as well.

## 2016-04-21 NOTE — Telephone Encounter (Signed)
Left msg for patient to call. 

## 2016-04-21 NOTE — Telephone Encounter (Signed)
New message   Patient calling  3 days Weight range is 169 to 174

## 2016-04-22 DIAGNOSIS — J9621 Acute and chronic respiratory failure with hypoxia: Secondary | ICD-10-CM | POA: Diagnosis not present

## 2016-04-22 DIAGNOSIS — Z7902 Long term (current) use of antithrombotics/antiplatelets: Secondary | ICD-10-CM | POA: Diagnosis not present

## 2016-04-22 DIAGNOSIS — I429 Cardiomyopathy, unspecified: Secondary | ICD-10-CM | POA: Diagnosis not present

## 2016-04-22 DIAGNOSIS — E785 Hyperlipidemia, unspecified: Secondary | ICD-10-CM | POA: Diagnosis not present

## 2016-04-22 DIAGNOSIS — Z5181 Encounter for therapeutic drug level monitoring: Secondary | ICD-10-CM | POA: Diagnosis not present

## 2016-04-22 DIAGNOSIS — J441 Chronic obstructive pulmonary disease with (acute) exacerbation: Secondary | ICD-10-CM | POA: Diagnosis not present

## 2016-04-22 DIAGNOSIS — Z95810 Presence of automatic (implantable) cardiac defibrillator: Secondary | ICD-10-CM | POA: Diagnosis not present

## 2016-04-22 DIAGNOSIS — I5043 Acute on chronic combined systolic (congestive) and diastolic (congestive) heart failure: Secondary | ICD-10-CM | POA: Diagnosis not present

## 2016-04-22 DIAGNOSIS — I2510 Atherosclerotic heart disease of native coronary artery without angina pectoris: Secondary | ICD-10-CM | POA: Diagnosis not present

## 2016-04-22 DIAGNOSIS — E119 Type 2 diabetes mellitus without complications: Secondary | ICD-10-CM | POA: Diagnosis not present

## 2016-04-22 DIAGNOSIS — Z7951 Long term (current) use of inhaled steroids: Secondary | ICD-10-CM | POA: Diagnosis not present

## 2016-04-22 DIAGNOSIS — I110 Hypertensive heart disease with heart failure: Secondary | ICD-10-CM | POA: Diagnosis not present

## 2016-04-22 NOTE — Telephone Encounter (Signed)
Spoke to [employer] and communicated Dr. Victorino December advice & instructions in detail, which she will relay to and discuss with patient. She is aware to call if any concerns or new problems. She voiced thanks for the call back and I thanked her for her care of the patient.

## 2016-04-22 NOTE — Telephone Encounter (Signed)
Pt of Dr. Sallyanne Kuster  Spoke to [place] with [facility]. Patient currently on regimen of Lasix 40mg  BID. She has been on higher dose in the past (80mg  BID). Review of chart shows she has had 2 hospitalizations since seeing Dr. Sallyanne Kuster in May. Last one she was discharged home on 10/27 w/ current med doses. She was seen by PCP on 11/1.  Donita notes 7 lb weight gain for patient over the course of 4 days - current weight 175.8 lbs. Does state that the patient weighs herself after waking, but wakes at different times.  Pt denies SOB, fatigue. On assessment Donita noted no change in abdominal girth or ankle swelling. Breath sounds clear on auscultation.  Informed her I would have cardiologist review.

## 2016-04-22 NOTE — Telephone Encounter (Signed)
[facility] nurse wanted the doctor to know about patient's weight gain. On 04-18-16 pt weight-168-6,04-21-16 weight-174.0 and today it is 175.8/ Pt have no symptoms,no shortness of breath,no edema and her lungs are clear.

## 2016-04-22 NOTE — Telephone Encounter (Signed)
Judging by her renal function, she may have been a little too "dry" at the time of hospital discharge. The few pounds that she has gained are probably appropriate. Note that her creatinine was a little high on labs performed November 1. Let's set current weight of 176 pounds as her "dry weight". If weight increases over 176 pounds, add another 40 mg of furosemide (that is increased the dose to 80 mg every morning and 40 mg every afternoon). Long as weight is 176 pounds or less, continue current diuretic regimen.

## 2016-04-23 ENCOUNTER — Other Ambulatory Visit: Payer: Self-pay

## 2016-04-23 ENCOUNTER — Ambulatory Visit: Payer: Medicare Other | Admitting: Family Medicine

## 2016-04-23 DIAGNOSIS — J441 Chronic obstructive pulmonary disease with (acute) exacerbation: Secondary | ICD-10-CM | POA: Diagnosis not present

## 2016-04-23 DIAGNOSIS — J9621 Acute and chronic respiratory failure with hypoxia: Secondary | ICD-10-CM | POA: Diagnosis not present

## 2016-04-23 DIAGNOSIS — I5043 Acute on chronic combined systolic (congestive) and diastolic (congestive) heart failure: Secondary | ICD-10-CM | POA: Diagnosis not present

## 2016-04-23 DIAGNOSIS — Z5181 Encounter for therapeutic drug level monitoring: Secondary | ICD-10-CM | POA: Diagnosis not present

## 2016-04-23 DIAGNOSIS — Z7902 Long term (current) use of antithrombotics/antiplatelets: Secondary | ICD-10-CM | POA: Diagnosis not present

## 2016-04-23 DIAGNOSIS — I2510 Atherosclerotic heart disease of native coronary artery without angina pectoris: Secondary | ICD-10-CM | POA: Diagnosis not present

## 2016-04-23 DIAGNOSIS — Z7951 Long term (current) use of inhaled steroids: Secondary | ICD-10-CM | POA: Diagnosis not present

## 2016-04-23 DIAGNOSIS — E119 Type 2 diabetes mellitus without complications: Secondary | ICD-10-CM | POA: Diagnosis not present

## 2016-04-23 DIAGNOSIS — Z95810 Presence of automatic (implantable) cardiac defibrillator: Secondary | ICD-10-CM | POA: Diagnosis not present

## 2016-04-23 DIAGNOSIS — I429 Cardiomyopathy, unspecified: Secondary | ICD-10-CM | POA: Diagnosis not present

## 2016-04-23 DIAGNOSIS — I110 Hypertensive heart disease with heart failure: Secondary | ICD-10-CM | POA: Diagnosis not present

## 2016-04-23 DIAGNOSIS — E785 Hyperlipidemia, unspecified: Secondary | ICD-10-CM | POA: Diagnosis not present

## 2016-04-23 NOTE — Patient Outreach (Signed)
[place] [facility]) Care Management  04/23/2016  Sydney Phillips 1935/07/27 [id number]   RNCM arrived at patient's home for scheduled home visit. RNCM knocked on the back door as requested by patient at previous outreach. No one came to the door after several attempts at knocking. RNCM attempted to call the patient multiple times with no answer. RNCM left HIPAA compliant voicemail with RNCM contact information and requested callback.  [place] R. Yazmine Sorey, RN, BSN, [place] Management Coordinator [phone]

## 2016-04-24 DIAGNOSIS — Z95810 Presence of automatic (implantable) cardiac defibrillator: Secondary | ICD-10-CM | POA: Diagnosis not present

## 2016-04-24 DIAGNOSIS — Z7902 Long term (current) use of antithrombotics/antiplatelets: Secondary | ICD-10-CM | POA: Diagnosis not present

## 2016-04-24 DIAGNOSIS — I110 Hypertensive heart disease with heart failure: Secondary | ICD-10-CM | POA: Diagnosis not present

## 2016-04-24 DIAGNOSIS — I2510 Atherosclerotic heart disease of native coronary artery without angina pectoris: Secondary | ICD-10-CM | POA: Diagnosis not present

## 2016-04-24 DIAGNOSIS — I429 Cardiomyopathy, unspecified: Secondary | ICD-10-CM | POA: Diagnosis not present

## 2016-04-24 DIAGNOSIS — E119 Type 2 diabetes mellitus without complications: Secondary | ICD-10-CM | POA: Diagnosis not present

## 2016-04-24 DIAGNOSIS — E785 Hyperlipidemia, unspecified: Secondary | ICD-10-CM | POA: Diagnosis not present

## 2016-04-24 DIAGNOSIS — Z5181 Encounter for therapeutic drug level monitoring: Secondary | ICD-10-CM | POA: Diagnosis not present

## 2016-04-24 DIAGNOSIS — Z7951 Long term (current) use of inhaled steroids: Secondary | ICD-10-CM | POA: Diagnosis not present

## 2016-04-24 DIAGNOSIS — I5043 Acute on chronic combined systolic (congestive) and diastolic (congestive) heart failure: Secondary | ICD-10-CM | POA: Diagnosis not present

## 2016-04-24 DIAGNOSIS — J441 Chronic obstructive pulmonary disease with (acute) exacerbation: Secondary | ICD-10-CM | POA: Diagnosis not present

## 2016-04-24 DIAGNOSIS — J9621 Acute and chronic respiratory failure with hypoxia: Secondary | ICD-10-CM | POA: Diagnosis not present

## 2016-04-29 ENCOUNTER — Ambulatory Visit: Payer: Medicare Other | Admitting: Family Medicine

## 2016-04-29 DIAGNOSIS — I5043 Acute on chronic combined systolic (congestive) and diastolic (congestive) heart failure: Secondary | ICD-10-CM | POA: Diagnosis not present

## 2016-04-29 DIAGNOSIS — Z7902 Long term (current) use of antithrombotics/antiplatelets: Secondary | ICD-10-CM | POA: Diagnosis not present

## 2016-04-29 DIAGNOSIS — I429 Cardiomyopathy, unspecified: Secondary | ICD-10-CM | POA: Diagnosis not present

## 2016-04-29 DIAGNOSIS — E785 Hyperlipidemia, unspecified: Secondary | ICD-10-CM | POA: Diagnosis not present

## 2016-04-29 DIAGNOSIS — J441 Chronic obstructive pulmonary disease with (acute) exacerbation: Secondary | ICD-10-CM | POA: Diagnosis not present

## 2016-04-29 DIAGNOSIS — Z5181 Encounter for therapeutic drug level monitoring: Secondary | ICD-10-CM | POA: Diagnosis not present

## 2016-04-29 DIAGNOSIS — Z95810 Presence of automatic (implantable) cardiac defibrillator: Secondary | ICD-10-CM | POA: Diagnosis not present

## 2016-04-29 DIAGNOSIS — E119 Type 2 diabetes mellitus without complications: Secondary | ICD-10-CM | POA: Diagnosis not present

## 2016-04-29 DIAGNOSIS — J9621 Acute and chronic respiratory failure with hypoxia: Secondary | ICD-10-CM | POA: Diagnosis not present

## 2016-04-29 DIAGNOSIS — I110 Hypertensive heart disease with heart failure: Secondary | ICD-10-CM | POA: Diagnosis not present

## 2016-04-29 DIAGNOSIS — Z7951 Long term (current) use of inhaled steroids: Secondary | ICD-10-CM | POA: Diagnosis not present

## 2016-04-29 DIAGNOSIS — I2510 Atherosclerotic heart disease of native coronary artery without angina pectoris: Secondary | ICD-10-CM | POA: Diagnosis not present

## 2016-04-29 NOTE — Addendum Note (Signed)
Addended by: Mena Goes on: 04/29/2016 04:20 PM   Modules accepted: Orders

## 2016-04-30 ENCOUNTER — Telehealth: Payer: Self-pay | Admitting: Family Medicine

## 2016-04-30 DIAGNOSIS — E119 Type 2 diabetes mellitus without complications: Secondary | ICD-10-CM | POA: Diagnosis not present

## 2016-04-30 DIAGNOSIS — J9621 Acute and chronic respiratory failure with hypoxia: Secondary | ICD-10-CM | POA: Diagnosis not present

## 2016-04-30 DIAGNOSIS — Z7902 Long term (current) use of antithrombotics/antiplatelets: Secondary | ICD-10-CM | POA: Diagnosis not present

## 2016-04-30 DIAGNOSIS — I5043 Acute on chronic combined systolic (congestive) and diastolic (congestive) heart failure: Secondary | ICD-10-CM | POA: Diagnosis not present

## 2016-04-30 DIAGNOSIS — Z95810 Presence of automatic (implantable) cardiac defibrillator: Secondary | ICD-10-CM | POA: Diagnosis not present

## 2016-04-30 DIAGNOSIS — I2510 Atherosclerotic heart disease of native coronary artery without angina pectoris: Secondary | ICD-10-CM | POA: Diagnosis not present

## 2016-04-30 DIAGNOSIS — E785 Hyperlipidemia, unspecified: Secondary | ICD-10-CM | POA: Diagnosis not present

## 2016-04-30 DIAGNOSIS — Z5181 Encounter for therapeutic drug level monitoring: Secondary | ICD-10-CM | POA: Diagnosis not present

## 2016-04-30 DIAGNOSIS — I110 Hypertensive heart disease with heart failure: Secondary | ICD-10-CM | POA: Diagnosis not present

## 2016-04-30 DIAGNOSIS — I429 Cardiomyopathy, unspecified: Secondary | ICD-10-CM | POA: Diagnosis not present

## 2016-04-30 DIAGNOSIS — J441 Chronic obstructive pulmonary disease with (acute) exacerbation: Secondary | ICD-10-CM | POA: Diagnosis not present

## 2016-04-30 DIAGNOSIS — Z7951 Long term (current) use of inhaled steroids: Secondary | ICD-10-CM | POA: Diagnosis not present

## 2016-04-30 NOTE — Telephone Encounter (Signed)
° °  Shaunda from [facility] call to say pt slipped on her sheet and fell but there were no injuries   [id number]

## 2016-05-01 DIAGNOSIS — Z7902 Long term (current) use of antithrombotics/antiplatelets: Secondary | ICD-10-CM | POA: Diagnosis not present

## 2016-05-01 DIAGNOSIS — E785 Hyperlipidemia, unspecified: Secondary | ICD-10-CM | POA: Diagnosis not present

## 2016-05-01 DIAGNOSIS — E119 Type 2 diabetes mellitus without complications: Secondary | ICD-10-CM | POA: Diagnosis not present

## 2016-05-01 DIAGNOSIS — J9621 Acute and chronic respiratory failure with hypoxia: Secondary | ICD-10-CM | POA: Diagnosis not present

## 2016-05-01 DIAGNOSIS — I5043 Acute on chronic combined systolic (congestive) and diastolic (congestive) heart failure: Secondary | ICD-10-CM | POA: Diagnosis not present

## 2016-05-01 DIAGNOSIS — I2510 Atherosclerotic heart disease of native coronary artery without angina pectoris: Secondary | ICD-10-CM | POA: Diagnosis not present

## 2016-05-01 DIAGNOSIS — I429 Cardiomyopathy, unspecified: Secondary | ICD-10-CM | POA: Diagnosis not present

## 2016-05-01 DIAGNOSIS — J441 Chronic obstructive pulmonary disease with (acute) exacerbation: Secondary | ICD-10-CM | POA: Diagnosis not present

## 2016-05-01 DIAGNOSIS — Z7951 Long term (current) use of inhaled steroids: Secondary | ICD-10-CM | POA: Diagnosis not present

## 2016-05-01 DIAGNOSIS — Z5181 Encounter for therapeutic drug level monitoring: Secondary | ICD-10-CM | POA: Diagnosis not present

## 2016-05-01 DIAGNOSIS — I110 Hypertensive heart disease with heart failure: Secondary | ICD-10-CM | POA: Diagnosis not present

## 2016-05-01 DIAGNOSIS — Z95810 Presence of automatic (implantable) cardiac defibrillator: Secondary | ICD-10-CM | POA: Diagnosis not present

## 2016-05-02 ENCOUNTER — Other Ambulatory Visit: Payer: Self-pay

## 2016-05-02 ENCOUNTER — Ambulatory Visit (INDEPENDENT_AMBULATORY_CARE_PROVIDER_SITE_OTHER): Payer: Medicare Other | Admitting: Family Medicine

## 2016-05-02 ENCOUNTER — Encounter: Payer: Self-pay | Admitting: Family Medicine

## 2016-05-02 VITALS — BP 140/80 | Temp 98.4°F | Ht 68.0 in | Wt 174.0 lb

## 2016-05-02 DIAGNOSIS — D72829 Elevated white blood cell count, unspecified: Secondary | ICD-10-CM | POA: Diagnosis not present

## 2016-05-02 DIAGNOSIS — G8929 Other chronic pain: Secondary | ICD-10-CM

## 2016-05-02 DIAGNOSIS — M545 Low back pain, unspecified: Secondary | ICD-10-CM

## 2016-05-02 DIAGNOSIS — I10 Essential (primary) hypertension: Secondary | ICD-10-CM

## 2016-05-02 DIAGNOSIS — J438 Other emphysema: Secondary | ICD-10-CM | POA: Diagnosis not present

## 2016-05-02 LAB — CBC WITH DIFFERENTIAL/PLATELET
BASOS PCT: 0.4 % (ref 0.0–3.0)
Basophils Absolute: 0 10*3/uL (ref 0.0–0.1)
EOS PCT: 5.3 % — AB (ref 0.0–5.0)
Eosinophils Absolute: 0.4 10*3/uL (ref 0.0–0.7)
HEMATOCRIT: 43 % (ref 36.0–46.0)
HEMOGLOBIN: 13.9 g/dL (ref 12.0–15.0)
LYMPHS PCT: 22.3 % (ref 12.0–46.0)
Lymphs Abs: 1.8 10*3/uL (ref 0.7–4.0)
MCHC: 32.5 g/dL (ref 30.0–36.0)
MCV: 96.2 fl (ref 78.0–100.0)
MONOS PCT: 8.5 % (ref 3.0–12.0)
Monocytes Absolute: 0.7 10*3/uL (ref 0.1–1.0)
Neutro Abs: 5.2 10*3/uL (ref 1.4–7.7)
Neutrophils Relative %: 63.5 % (ref 43.0–77.0)
Platelets: 684 10*3/uL — ABNORMAL HIGH (ref 150.0–400.0)
RBC: 4.47 Mil/uL (ref 3.87–5.11)
RDW: 15.4 % (ref 11.5–15.5)
WBC: 8.1 10*3/uL (ref 4.0–10.5)

## 2016-05-02 MED ORDER — HYDROCODONE-ACETAMINOPHEN 10-325 MG PO TABS: 1.0000 | ORAL_TABLET | Freq: Four times a day (QID) | ORAL | 0 refills | Status: DC | PRN

## 2016-05-02 MED ORDER — METOPROLOL SUCCINATE ER 25 MG PO TB24: 25.0000 mg | ORAL_TABLET | Freq: Every day | ORAL | 11 refills | Status: DC

## 2016-05-02 MED ORDER — FUROSEMIDE 40 MG PO TABS: 40.0000 mg | ORAL_TABLET | Freq: Two times a day (BID) | ORAL | 11 refills | Status: DC

## 2016-05-02 MED ORDER — ALPRAZOLAM 1 MG PO TABS: 1.0000 mg | ORAL_TABLET | Freq: Four times a day (QID) | ORAL | 5 refills | Status: DC | PRN

## 2016-05-02 NOTE — Progress Notes (Signed)
   Subjective:    Patient ID: Sydney Phillips, female    DOB: 03/27/1936, 80 y.o.   MRN: [id number]  HPI Here for med refills and also to discuss an elevated WBC count. When she was in the hospital last month her WBC doubled in 24 hours from 15 to 30, and it has remained high. When she was here 2 weeks ago it was 24, even though she felt good. Today she still feels well with no symptoms to suggest an infection. Of note, she was treated with high dose steroids while in the hospital last month.    Review of Systems  Constitutional: Negative.   HENT: Negative.   Respiratory: Negative.   Cardiovascular: Negative.   Gastrointestinal: Negative.   Genitourinary: Negative.   Neurological: Negative.        Objective:   Physical Exam  Constitutional: She is oriented to person, place, and time. She appears well-developed and well-nourished. No distress.  Neck: No thyromegaly present.  Cardiovascular: Normal rate, regular rhythm, normal heart sounds and intact distal pulses.   Pulmonary/Chest: Effort normal and breath sounds normal. No respiratory distress. She has no wheezes. She has no rales.  Abdominal: Soft. Bowel sounds are normal. She exhibits no distension and no mass. There is no tenderness. There is no rebound and no guarding.  Lymphadenopathy:    She has no cervical adenopathy.  Neurological: She is alert and oriented to person, place, and time.  Skin: No erythema.          Assessment & Plan:  Her HTN and other issues are stable. We will repeat a CBC today. Her leukocytosis could well be a reaction to the steroids she was treated with. If so it should be coming back down to normal by now.  Laurey Morale, MD

## 2016-05-02 NOTE — Patient Outreach (Signed)
[place] [facility]) Care Management  05/02/16  KARMANN BLAUSTEIN 1935-12-29 [id number]  Successful outreach completed with patient. Patient identification verified. Patient stated that she is doing well and has not had any shortness of breath. Stated that she is not using her oxygen anymore either.  Stated that she had a doctor's appointment today and did not know why she had one. Stated that she went in and doctor told her that when she was discharged from the hospital that she had a high white blood count and he was very concerned about it. Stated that after talking with him today, they believe it was related to the prednisone that she was on in the hospital. Stated that he rechecked her levels and she should know more on Monday. She reports that home health discharged her yesterday and she does not really have any needs.  Patient reports that she has no questions or concerns but is agreeable to reschedule home visit for next week.  Plan: Complete home visit next week to complete assessment and possibly close if no further needs.  [place] R. Oumar Marcott, RN, BSN, [place] Management Coordinator [phone]

## 2016-05-02 NOTE — Progress Notes (Signed)
Pre visit review using our clinic review tool, if applicable. No additional management support is needed unless otherwise documented below in the visit note. 

## 2016-05-06 ENCOUNTER — Other Ambulatory Visit: Payer: Self-pay

## 2016-05-06 NOTE — Patient Outreach (Signed)
[place] [facility]) Care Management  Sydney Phillips  05/06/2016   Sydney Phillips 1935/07/14 [id number]  Subjective: Home visit completed with patient and nursing student from [facility].  Objective:  Vitals:   05/06/16 1553  BP: 127/67  Pulse: 78  Resp: 20    Encounter Medications:  Outpatient Encounter Prescriptions as of 05/06/2016  Medication Sig  . albuterol (PROVENTIL HFA;VENTOLIN HFA) 108 (90 Base) MCG/ACT inhaler Inhale 2 puffs into the lungs every 4 (four) hours as needed for wheezing or shortness of breath.  . allopurinol (ZYLOPRIM) 300 MG tablet Take 1 tablet (300 mg total) by mouth daily.  Marland Kitchen ALPRAZolam (XANAX) 1 MG tablet Take 1 tablet (1 mg total) by mouth every 6 (six) hours as needed for anxiety.  . clopidogrel (PLAVIX) 75 MG tablet Take 1 tablet (75 mg total) by mouth daily.  . DULoxetine (CYMBALTA) 60 MG capsule TAKE ONE CAPSULE BY MOUTH TWICE DAILY (Patient taking differently: TAKE 60 MG BY MOUTH TWICE DAILY)  . furosemide (LASIX) 40 MG tablet Take 1 tablet (40 mg total) by mouth 2 (two) times daily.  Marland Kitchen HYDROcodone-acetaminophen (NORCO) 10-325 MG tablet Take 1 tablet by mouth every 6 (six) hours as needed for moderate pain.  Marland Kitchen ketotifen (ZADITOR) 0.025 % ophthalmic solution Place 1 drop into both eyes 2 (two) times daily.  . metoprolol succinate (TOPROL-XL) 25 MG 24 hr tablet Take 1 tablet (25 mg total) by mouth daily.  . Triamcinolone Acetonide (NASACORT AQ NA) Place 2 sprays into the nose daily as needed (for congestion). Over the counter   . ACCU-CHEK FASTCLIX LANCETS MISC USE   TO CHECK GLUCOSE ONCE DAILY  . ACCU-CHEK SMARTVIEW test strip CHECK BLOOD SUGAR ONCE DAILY  . nitroGLYCERIN (NITROSTAT) 0.4 MG SL tablet Place 1 tablet (0.4 mg total) under the tongue every 5 (five) minutes x 3 doses as needed for chest pain. (Patient not taking: Reported on 05/06/2016)   No facility-administered encounter medications on file as of 05/06/2016.      Functional Status:  In your present state of health, do you have any difficulty performing the following activities: 05/06/2016 04/09/2016  Hearing? [place]? Y -  Difficulty concentrating or making decisions? N -  Walking or climbing stairs? N -  Dressing or bathing? N -  Doing errands, shopping? Y N  Preparing Food and eating ? N -  Using the Toilet? N -  In the past six months, have you accidently leaked urine? Y -  Do you have problems with loss of bowel control? N -  Managing your Medications? N -  Managing your Finances? Y -  Housekeeping or managing your Housekeeping? Y -  Some recent data might be hidden    Fall/Depression Screening: PHQ 2/9 Scores 05/06/2016 05/24/2014  PHQ - 2 Score 0 0    Assessment: Patient stated that she has been doing well since discharge. She currently denies any pain or any concerns.  Patient stated that she has not been using her oxygen and wants them to come and pick it up. Stated that she has been doing good and does not feel she will need it Patient admits to continued smoking and stated that she is not willing to quit. She stated that she is a recovering alcoholic and does not do anything else. She stated that this is her one vice that she refused to give up. RNCM provided education about smoking and her respiratory issues, as well as the impact on her diabetes  and heart failure. She verbalizes understanding that it is not good to smoke but still unwilling to discuss quitting.   Patient lives at home with her son. She is currently independent with ADLs and IADLs. Patient does most of the cooking for her and her son, but he does occasionally cook as well. Her son is available to assist with transportation to appointments.  Patient stated that she does weigh herself every morning. Stated that weight today was 175 lbs. Her weight at discharge was 178 lbs and 9.6 oz.   Patient also reports that she is checking her blood sugar. Stated that her  fasting blood sugar today was 141.  Patient stated that her medications cost around $75 per month and so far she has been able to pay this. She stated that sometimes her son does help her if she cannot afford them.   Patient denies any needs. She stated that she had agreed to the home visit so RNCM could "get it over with." She is open to continued phone outreach throughout [facility], but feels she is doing ok.   Plan: Will continue to follow for TOC.  [name], RN, BSN, [place] Management Coordinator [phone]

## 2016-05-12 DIAGNOSIS — J449 Chronic obstructive pulmonary disease, unspecified: Secondary | ICD-10-CM | POA: Diagnosis not present

## 2016-05-13 ENCOUNTER — Telehealth: Payer: Self-pay | Admitting: Family Medicine

## 2016-05-13 NOTE — Telephone Encounter (Signed)
The patient called and left a voice mail needing to speak with the Dr ASAP

## 2016-05-13 NOTE — Telephone Encounter (Signed)
Spoke with pt and she c/o cough with yellow mucus and increased ShOB. Pt denies wheeze or f/n/v. Pt has used SABA with no improvement. There are no open appts on your schedule.  Dr. Sarajane Jews - Please advise. Thanks!

## 2016-05-14 ENCOUNTER — Inpatient Hospital Stay (HOSPITAL_COMMUNITY)
Admission: EM | Admit: 2016-05-14 | Discharge: 2016-05-23 | DRG: 280 | Disposition: A | Discharge status: Skilled Nursing Facility | Payer: Medicare Other | Attending: Internal Medicine | Admitting: Internal Medicine

## 2016-05-14 ENCOUNTER — Emergency Department (HOSPITAL_COMMUNITY): Payer: Medicare Other

## 2016-05-14 ENCOUNTER — Other Ambulatory Visit: Payer: Self-pay

## 2016-05-14 ENCOUNTER — Encounter (HOSPITAL_COMMUNITY): Payer: Self-pay | Admitting: Emergency Medicine

## 2016-05-14 DIAGNOSIS — I252 Old myocardial infarction: Secondary | ICD-10-CM

## 2016-05-14 DIAGNOSIS — G9340 Encephalopathy, unspecified: Secondary | ICD-10-CM | POA: Diagnosis not present

## 2016-05-14 DIAGNOSIS — I10 Essential (primary) hypertension: Secondary | ICD-10-CM | POA: Diagnosis present

## 2016-05-14 DIAGNOSIS — Y92009 Unspecified place in unspecified non-institutional (private) residence as the place of occurrence of the external cause: Secondary | ICD-10-CM

## 2016-05-14 DIAGNOSIS — F0390 Unspecified dementia without behavioral disturbance: Secondary | ICD-10-CM | POA: Diagnosis present

## 2016-05-14 DIAGNOSIS — J449 Chronic obstructive pulmonary disease, unspecified: Secondary | ICD-10-CM

## 2016-05-14 DIAGNOSIS — Z95810 Presence of automatic (implantable) cardiac defibrillator: Secondary | ICD-10-CM

## 2016-05-14 DIAGNOSIS — Z885 Allergy status to narcotic agent status: Secondary | ICD-10-CM

## 2016-05-14 DIAGNOSIS — Z9114 Patient's other noncompliance with medication regimen: Secondary | ICD-10-CM

## 2016-05-14 DIAGNOSIS — E669 Obesity, unspecified: Secondary | ICD-10-CM | POA: Diagnosis present

## 2016-05-14 DIAGNOSIS — I255 Ischemic cardiomyopathy: Secondary | ICD-10-CM | POA: Diagnosis not present

## 2016-05-14 DIAGNOSIS — W19XXXA Unspecified fall, initial encounter: Secondary | ICD-10-CM | POA: Diagnosis not present

## 2016-05-14 DIAGNOSIS — F17210 Nicotine dependence, cigarettes, uncomplicated: Secondary | ICD-10-CM | POA: Diagnosis present

## 2016-05-14 DIAGNOSIS — G4700 Insomnia, unspecified: Secondary | ICD-10-CM | POA: Diagnosis present

## 2016-05-14 DIAGNOSIS — N170 Acute kidney failure with tubular necrosis: Secondary | ICD-10-CM | POA: Diagnosis present

## 2016-05-14 DIAGNOSIS — Y92099 Unspecified place in other non-institutional residence as the place of occurrence of the external cause: Secondary | ICD-10-CM

## 2016-05-14 DIAGNOSIS — Z9181 History of falling: Secondary | ICD-10-CM

## 2016-05-14 DIAGNOSIS — Z9981 Dependence on supplemental oxygen: Secondary | ICD-10-CM

## 2016-05-14 DIAGNOSIS — J80 Acute respiratory distress syndrome: Secondary | ICD-10-CM | POA: Diagnosis not present

## 2016-05-14 DIAGNOSIS — Z79899 Other long term (current) drug therapy: Secondary | ICD-10-CM

## 2016-05-14 DIAGNOSIS — H269 Unspecified cataract: Secondary | ICD-10-CM | POA: Diagnosis present

## 2016-05-14 DIAGNOSIS — I708 Atherosclerosis of other arteries: Secondary | ICD-10-CM | POA: Diagnosis present

## 2016-05-14 DIAGNOSIS — I447 Left bundle-branch block, unspecified: Secondary | ICD-10-CM | POA: Diagnosis not present

## 2016-05-14 DIAGNOSIS — Z880 Allergy status to penicillin: Secondary | ICD-10-CM

## 2016-05-14 DIAGNOSIS — I5043 Acute on chronic combined systolic (congestive) and diastolic (congestive) heart failure: Secondary | ICD-10-CM | POA: Diagnosis present

## 2016-05-14 DIAGNOSIS — T380X5A Adverse effect of glucocorticoids and synthetic analogues, initial encounter: Secondary | ICD-10-CM | POA: Diagnosis present

## 2016-05-14 DIAGNOSIS — G4733 Obstructive sleep apnea (adult) (pediatric): Secondary | ICD-10-CM | POA: Diagnosis not present

## 2016-05-14 DIAGNOSIS — I5042 Chronic combined systolic (congestive) and diastolic (congestive) heart failure: Secondary | ICD-10-CM | POA: Diagnosis not present

## 2016-05-14 DIAGNOSIS — T1490XA Injury, unspecified, initial encounter: Secondary | ICD-10-CM | POA: Diagnosis not present

## 2016-05-14 DIAGNOSIS — I214 Non-ST elevation (NSTEMI) myocardial infarction: Secondary | ICD-10-CM | POA: Diagnosis not present

## 2016-05-14 DIAGNOSIS — E1165 Type 2 diabetes mellitus with hyperglycemia: Secondary | ICD-10-CM | POA: Diagnosis not present

## 2016-05-14 DIAGNOSIS — D72829 Elevated white blood cell count, unspecified: Secondary | ICD-10-CM | POA: Diagnosis present

## 2016-05-14 DIAGNOSIS — M351 Other overlap syndromes: Secondary | ICD-10-CM | POA: Diagnosis not present

## 2016-05-14 DIAGNOSIS — E785 Hyperlipidemia, unspecified: Secondary | ICD-10-CM | POA: Diagnosis present

## 2016-05-14 DIAGNOSIS — I081 Rheumatic disorders of both mitral and tricuspid valves: Secondary | ICD-10-CM | POA: Diagnosis present

## 2016-05-14 DIAGNOSIS — I2583 Coronary atherosclerosis due to lipid rich plaque: Secondary | ICD-10-CM | POA: Diagnosis not present

## 2016-05-14 DIAGNOSIS — N183 Chronic kidney disease, stage 3 (moderate): Secondary | ICD-10-CM | POA: Diagnosis present

## 2016-05-14 DIAGNOSIS — G8929 Other chronic pain: Secondary | ICD-10-CM | POA: Diagnosis present

## 2016-05-14 DIAGNOSIS — Z8249 Family history of ischemic heart disease and other diseases of the circulatory system: Secondary | ICD-10-CM

## 2016-05-14 DIAGNOSIS — I5041 Acute combined systolic (congestive) and diastolic (congestive) heart failure: Secondary | ICD-10-CM | POA: Diagnosis not present

## 2016-05-14 DIAGNOSIS — E1151 Type 2 diabetes mellitus with diabetic peripheral angiopathy without gangrene: Secondary | ICD-10-CM | POA: Diagnosis present

## 2016-05-14 DIAGNOSIS — Z7902 Long term (current) use of antithrombotics/antiplatelets: Secondary | ICD-10-CM

## 2016-05-14 DIAGNOSIS — E876 Hypokalemia: Secondary | ICD-10-CM | POA: Diagnosis not present

## 2016-05-14 DIAGNOSIS — T148XXA Other injury of unspecified body region, initial encounter: Secondary | ICD-10-CM | POA: Diagnosis not present

## 2016-05-14 DIAGNOSIS — I348 Other nonrheumatic mitral valve disorders: Secondary | ICD-10-CM | POA: Diagnosis not present

## 2016-05-14 DIAGNOSIS — I429 Cardiomyopathy, unspecified: Secondary | ICD-10-CM | POA: Diagnosis present

## 2016-05-14 DIAGNOSIS — R079 Chest pain, unspecified: Secondary | ICD-10-CM | POA: Diagnosis not present

## 2016-05-14 DIAGNOSIS — R400 Somnolence: Secondary | ICD-10-CM | POA: Diagnosis not present

## 2016-05-14 DIAGNOSIS — I340 Nonrheumatic mitral (valve) insufficiency: Secondary | ICD-10-CM

## 2016-05-14 DIAGNOSIS — F418 Other specified anxiety disorders: Secondary | ICD-10-CM | POA: Diagnosis present

## 2016-05-14 DIAGNOSIS — E1122 Type 2 diabetes mellitus with diabetic chronic kidney disease: Secondary | ICD-10-CM | POA: Diagnosis not present

## 2016-05-14 DIAGNOSIS — I5082 Biventricular heart failure: Secondary | ICD-10-CM | POA: Diagnosis not present

## 2016-05-14 DIAGNOSIS — E119 Type 2 diabetes mellitus without complications: Secondary | ICD-10-CM

## 2016-05-14 DIAGNOSIS — I130 Hypertensive heart and chronic kidney disease with heart failure and stage 1 through stage 4 chronic kidney disease, or unspecified chronic kidney disease: Secondary | ICD-10-CM | POA: Diagnosis present

## 2016-05-14 DIAGNOSIS — Z6832 Body mass index (BMI) 32.0-32.9, adult: Secondary | ICD-10-CM

## 2016-05-14 DIAGNOSIS — J9621 Acute and chronic respiratory failure with hypoxia: Secondary | ICD-10-CM | POA: Diagnosis not present

## 2016-05-14 DIAGNOSIS — I771 Stricture of artery: Secondary | ICD-10-CM

## 2016-05-14 DIAGNOSIS — Z9119 Patient's noncompliance with other medical treatment and regimen: Secondary | ICD-10-CM

## 2016-05-14 DIAGNOSIS — R05 Cough: Secondary | ICD-10-CM | POA: Diagnosis not present

## 2016-05-14 DIAGNOSIS — Z9861 Coronary angioplasty status: Secondary | ICD-10-CM

## 2016-05-14 DIAGNOSIS — R748 Abnormal levels of other serum enzymes: Secondary | ICD-10-CM

## 2016-05-14 DIAGNOSIS — K219 Gastro-esophageal reflux disease without esophagitis: Secondary | ICD-10-CM | POA: Diagnosis present

## 2016-05-14 DIAGNOSIS — J441 Chronic obstructive pulmonary disease with (acute) exacerbation: Secondary | ICD-10-CM

## 2016-05-14 DIAGNOSIS — M797 Fibromyalgia: Secondary | ICD-10-CM | POA: Diagnosis present

## 2016-05-14 DIAGNOSIS — I272 Pulmonary hypertension, unspecified: Secondary | ICD-10-CM | POA: Diagnosis not present

## 2016-05-14 DIAGNOSIS — Z79891 Long term (current) use of opiate analgesic: Secondary | ICD-10-CM

## 2016-05-14 DIAGNOSIS — R531 Weakness: Secondary | ICD-10-CM | POA: Diagnosis not present

## 2016-05-14 DIAGNOSIS — I2510 Atherosclerotic heart disease of native coronary artery without angina pectoris: Secondary | ICD-10-CM | POA: Diagnosis present

## 2016-05-14 DIAGNOSIS — M549 Dorsalgia, unspecified: Secondary | ICD-10-CM | POA: Diagnosis present

## 2016-05-14 DIAGNOSIS — G894 Chronic pain syndrome: Secondary | ICD-10-CM | POA: Diagnosis present

## 2016-05-14 DIAGNOSIS — M6282 Rhabdomyolysis: Secondary | ICD-10-CM | POA: Diagnosis present

## 2016-05-14 DIAGNOSIS — E1169 Type 2 diabetes mellitus with other specified complication: Secondary | ICD-10-CM | POA: Diagnosis not present

## 2016-05-14 DIAGNOSIS — Z950 Presence of cardiac pacemaker: Secondary | ICD-10-CM

## 2016-05-14 DIAGNOSIS — Z881 Allergy status to other antibiotic agents status: Secondary | ICD-10-CM

## 2016-05-14 DIAGNOSIS — Z888 Allergy status to other drugs, medicaments and biological substances status: Secondary | ICD-10-CM

## 2016-05-14 DIAGNOSIS — S25102S Unspecified injury of left innominate or subclavian artery, sequela: Secondary | ICD-10-CM | POA: Diagnosis not present

## 2016-05-14 DIAGNOSIS — Z86010 Personal history of colonic polyps: Secondary | ICD-10-CM

## 2016-05-14 DIAGNOSIS — Z882 Allergy status to sulfonamides status: Secondary | ICD-10-CM

## 2016-05-14 DIAGNOSIS — K5909 Other constipation: Secondary | ICD-10-CM | POA: Diagnosis present

## 2016-05-14 LAB — BLOOD GAS, ARTERIAL
ACID-BASE EXCESS: 4.3 mmol/L — AB (ref 0.0–2.0)
Bicarbonate: 28.1 mmol/L — ABNORMAL HIGH (ref 20.0–28.0)
Drawn by: 364961
O2 CONTENT: 2 L/min
O2 SAT: 92.7 %
PATIENT TEMPERATURE: 98.4
PCO2 ART: 40.6 mmHg (ref 32.0–48.0)
PO2 ART: 64.1 mmHg — AB (ref 83.0–108.0)
pH, Arterial: 7.454 — ABNORMAL HIGH (ref 7.350–7.450)

## 2016-05-14 LAB — RAPID URINE DRUG SCREEN, HOSP PERFORMED
AMPHETAMINES: NOT DETECTED
BENZODIAZEPINES: POSITIVE — AB
Barbiturates: NOT DETECTED
COCAINE: NOT DETECTED
OPIATES: POSITIVE — AB
Tetrahydrocannabinol: NOT DETECTED

## 2016-05-14 LAB — URINALYSIS, ROUTINE W REFLEX MICROSCOPIC
Bilirubin Urine: NEGATIVE
Glucose, UA: NEGATIVE mg/dL
Hgb urine dipstick: NEGATIVE
KETONES UR: NEGATIVE mg/dL
LEUKOCYTES UA: NEGATIVE
NITRITE: NEGATIVE
PROTEIN: 30 mg/dL — AB
Specific Gravity, Urine: 1.018 (ref 1.005–1.030)
pH: 5 (ref 5.0–8.0)

## 2016-05-14 LAB — URINE MICROSCOPIC-ADD ON

## 2016-05-14 LAB — TROPONIN I
Troponin I: 11.34 ng/mL (ref <0.03)
Troponin I: 9.73 ng/mL (ref <0.03)

## 2016-05-14 LAB — CBC
HCT: 40.3 % (ref 36.0–46.0)
Hemoglobin: 12.8 g/dL (ref 12.0–15.0)
MCH: 30.6 pg (ref 26.0–34.0)
MCHC: 31.8 g/dL (ref 30.0–36.0)
MCV: 96.4 fL (ref 78.0–100.0)
PLATELETS: 342 10*3/uL (ref 150–400)
RBC: 4.18 MIL/uL (ref 3.87–5.11)
RDW: 15.2 % (ref 11.5–15.5)
WBC: 18.1 10*3/uL — AB (ref 4.0–10.5)

## 2016-05-14 LAB — BASIC METABOLIC PANEL
Anion gap: 12 (ref 5–15)
BUN: 24 mg/dL — AB (ref 6–20)
CHLORIDE: 95 mmol/L — AB (ref 101–111)
CO2: 28 mmol/L (ref 22–32)
CREATININE: 1.64 mg/dL — AB (ref 0.44–1.00)
Calcium: 8.6 mg/dL — ABNORMAL LOW (ref 8.9–10.3)
GFR, EST AFRICAN AMERICAN: 33 mL/min — AB (ref >60)
GFR, EST NON AFRICAN AMERICAN: 29 mL/min — AB (ref >60)
Glucose, Bld: 174 mg/dL — ABNORMAL HIGH (ref 65–99)
POTASSIUM: 2.6 mmol/L — AB (ref 3.5–5.1)
SODIUM: 135 mmol/L (ref 135–145)

## 2016-05-14 LAB — MAGNESIUM: Magnesium: 1.9 mg/dL (ref 1.7–2.4)

## 2016-05-14 LAB — APTT: aPTT: 39 seconds — ABNORMAL HIGH (ref 24–36)

## 2016-05-14 LAB — CK: Total CK: 1073 U/L — ABNORMAL HIGH (ref 38–234)

## 2016-05-14 LAB — TSH: TSH: 1.013 u[IU]/mL (ref 0.350–4.500)

## 2016-05-14 MED ORDER — METOPROLOL SUCCINATE ER 25 MG PO TB24: 25.0000 mg | ORAL_TABLET | Freq: Every day | ORAL | Status: DC

## 2016-05-14 MED ORDER — GUAIFENESIN ER 600 MG PO TB12
ORAL_TABLET | ORAL | Status: AC
  Filled 2016-05-14: qty 2

## 2016-05-14 MED ORDER — HEPARIN (PORCINE) IN NACL 100-0.45 UNIT/ML-% IJ SOLN
1400.0000 [IU]/h | INTRAMUSCULAR | Status: DC
  Administered 2016-05-14: 1000 [IU]/h via INTRAVENOUS
  Administered 2016-05-15: 1400 [IU]/h via INTRAVENOUS
  Administered 2016-05-15: 1250 [IU]/h via INTRAVENOUS
  Administered 2016-05-16: 1400 [IU]/h via INTRAVENOUS
  Administered 2016-05-17: 1550 [IU]/h via INTRAVENOUS
  Administered 2016-05-19: 1400 [IU]/h via INTRAVENOUS
  Administered 2016-05-19: 1550 [IU]/h via INTRAVENOUS
  Filled 2016-05-14 (×8): qty 250

## 2016-05-14 MED ORDER — GUAIFENESIN ER 600 MG PO TB12
1200.0000 mg | ORAL_TABLET | Freq: Two times a day (BID) | ORAL | Status: DC
  Administered 2016-05-14 – 2016-05-15 (×2): 1200 mg via ORAL
  Filled 2016-05-14: qty 2

## 2016-05-14 MED ORDER — HEPARIN BOLUS VIA INFUSION
4000.0000 [IU] | Freq: Once | INTRAVENOUS | Status: AC
  Administered 2016-05-14: 4000 [IU] via INTRAVENOUS
  Filled 2016-05-14: qty 4000

## 2016-05-14 MED ORDER — SODIUM CHLORIDE 0.9 % IV SOLN
Freq: Once | INTRAVENOUS | Status: AC
  Administered 2016-05-14: 12:00:00 via INTRAVENOUS
  Filled 2016-05-14: qty 1000

## 2016-05-14 MED ORDER — LEVALBUTEROL HCL 1.25 MG/0.5ML IN NEBU: 1.2500 mg | INHALATION_SOLUTION | RESPIRATORY_TRACT | Status: DC | PRN

## 2016-05-14 MED ORDER — HYDROCODONE-ACETAMINOPHEN 10-325 MG PO TABS: 1.0000 | ORAL_TABLET | Freq: Four times a day (QID) | ORAL | Status: DC | PRN

## 2016-05-14 MED ORDER — ALPRAZOLAM 0.5 MG PO TABS: 1.0000 mg | ORAL_TABLET | Freq: Four times a day (QID) | ORAL | Status: DC | PRN

## 2016-05-14 MED ORDER — SODIUM CHLORIDE 0.9 % IV SOLN: Freq: Once | INTRAVENOUS | Status: DC

## 2016-05-14 MED ORDER — IPRATROPIUM-ALBUTEROL 0.5-2.5 (3) MG/3ML IN SOLN
3.0000 mL | Freq: Four times a day (QID) | RESPIRATORY_TRACT | Status: DC
  Administered 2016-05-14 – 2016-05-19 (×20): 3 mL via RESPIRATORY_TRACT
  Filled 2016-05-14 (×21): qty 3

## 2016-05-14 MED ORDER — ONDANSETRON HCL 4 MG/2ML IJ SOLN: 4.0000 mg | Freq: Four times a day (QID) | INTRAMUSCULAR | Status: DC | PRN

## 2016-05-14 MED ORDER — DOXYCYCLINE HYCLATE 100 MG PO TABS
100.0000 mg | ORAL_TABLET | Freq: Two times a day (BID) | ORAL | Status: DC
  Administered 2016-05-15: 100 mg via ORAL
  Filled 2016-05-14: qty 1

## 2016-05-14 MED ORDER — ALBUTEROL SULFATE HFA 108 (90 BASE) MCG/ACT IN AERS: 2.0000 | INHALATION_SPRAY | RESPIRATORY_TRACT | Status: DC | PRN

## 2016-05-14 MED ORDER — PREDNISONE 20 MG PO TABS: 40.0000 mg | ORAL_TABLET | Freq: Every day | ORAL | Status: DC

## 2016-05-14 MED ORDER — ALBUTEROL SULFATE (2.5 MG/3ML) 0.083% IN NEBU: 2.5000 mg | INHALATION_SOLUTION | RESPIRATORY_TRACT | Status: DC | PRN

## 2016-05-14 MED ORDER — DULOXETINE HCL 60 MG PO CPEP
60.0000 mg | ORAL_CAPSULE | Freq: Two times a day (BID) | ORAL | Status: DC
  Administered 2016-05-15 – 2016-05-23 (×17): 60 mg via ORAL
  Filled 2016-05-14 (×17): qty 1

## 2016-05-14 MED ORDER — ASPIRIN 81 MG PO CHEW
324.0000 mg | CHEWABLE_TABLET | Freq: Once | ORAL | Status: DC
  Filled 2016-05-14: qty 4

## 2016-05-14 MED ORDER — NITROGLYCERIN 0.4 MG SL SUBL: 0.4000 mg | SUBLINGUAL_TABLET | SUBLINGUAL | Status: DC | PRN

## 2016-05-14 MED ORDER — LACTATED RINGERS IV BOLUS (SEPSIS): 1000.0000 mL | Freq: Once | INTRAVENOUS | Status: DC

## 2016-05-14 MED ORDER — NALOXONE HCL 0.4 MG/ML IJ SOLN
0.4000 mg | Freq: Once | INTRAMUSCULAR | Status: AC
  Administered 2016-05-14: 0.4 mg via INTRAVENOUS
  Filled 2016-05-14: qty 1

## 2016-05-14 MED ORDER — ALLOPURINOL 300 MG PO TABS
300.0000 mg | ORAL_TABLET | Freq: Every day | ORAL | Status: DC
Start: 2016-05-14 — End: 2016-05-23
  Administered 2016-05-15 – 2016-05-23 (×9): 300 mg via ORAL
  Filled 2016-05-14 (×9): qty 1

## 2016-05-14 NOTE — ED Notes (Signed)
Unable to collect labs at this time patient is eating

## 2016-05-14 NOTE — Telephone Encounter (Signed)
Call in Ceftin 500 mg bid for 7 days

## 2016-05-14 NOTE — ED Notes (Signed)
Sydney Phillips Gabriel Carina (caregiver and medical POA) may be reached [phone].  He was at the bedside and has to leave now

## 2016-05-14 NOTE — Significant Event (Signed)
Rapid Response Event Note RN called for WOB Overview: Time Called: 2125 Arrival Time: 2128 Event Type: Respiratory  Initial Focused Assessment: Upon arrival pt noted to be lethargic but easily aroused with verbal stimuli, skin warm and dry, crackles and wheezing noted anterior, oriented x3, pt denies any pain other than left lateral rib discomfort due to a recent fall.  BP 132/63, RR 26, HR 107, 98.7 oral, 95% 2L [place]  BP 137/62, HR 107, RR 42, 92% 2L [place]  Interventions: ABG ordered and obtained. Dr. Elson Areas with cardiology and Dr. Roel Cluck triad at bedside. Pt transferred to [place]    Event Summary: Name of Physician Notified: Dr. Elson Areas  at 2130  Name of Consulting Physician Notified: DR. Roel Cluck  at 2215  Outcome: Transferred ([place])     Gevena Mart, Sela Hua

## 2016-05-14 NOTE — ED Notes (Signed)
Attempted to call report to 2W-unable to receive report at this time will call back.

## 2016-05-14 NOTE — H&P (Addendum)
History and Physical    Sydney Phillips [id number] DOB: 1936/05/14 DOA: 05/14/2016  PCP: Laurey Morale, MD  Patient coming from: Home  Chief Complaint: Fall  HPI: Sydney Phillips is a 80 y.o. female with medical history significant of hypertension, LBBB, CAD, combined heart failure s/p AICD, carotid artery disease s/p left CEA, left subclavian artery stenosis s/p stent, PAD s/p right fem-pop, hyperlipidemia. Patient presented to the ED after being found down by her son for an unknown amount of time. She has had about 3 falls recently thought to be secondary to medications. She is not sure of why she fell. She reports some associated right sided chest wall pain.  ED Course: Vitals: Mild tachycardia and tachypnea Labs: potassium of 2.6. Troponin of 11.34. Creatinine of 1.64 Imaging: CT head negative for acute bleed. CXR shows stable cardiomegaly with ?pulmonary vascular congestion. Rib film shows no fracture. Medications/Course: Patient started on heparin  Review of Systems: Review of Systems  Constitutional: Negative for chills and fever.  Respiratory: Positive for shortness of breath and wheezing. Negative for cough.   Cardiovascular: Positive for chest pain (right sided).  Gastrointestinal: Negative for abdominal pain, constipation, diarrhea, nausea and vomiting.  All other systems reviewed and are negative.   Past Medical History:  Diagnosis Date  . Arthritis   . Automatic implantable cardioverter-defibrillator in situ   . Bronchitis   . CAD (coronary artery disease)    Currently angina free, no evidence of reversible ischemia  . Chronic back pain   . Chronic constipation   . Colon polyps 2003.  2015.   HP polyps 2003.  adnomas 2015.  required referal to [facility] for colonoscopic resection of flat polyps.   . Congestive heart failure (CHF) (HCC)    New York Heart Association functional class 2, diastolic dysfunction  . COPD (chronic obstructive pulmonary disease)  ([place])   . Depression with anxiety    takes Cymbalta daily  . Diabetes mellitus without complication (HCC)    no meds  . Early cataracts, bilateral   . Fibromyalgia   . GERD (gastroesophageal reflux disease)    was on meds but was taken off;now watches what she eats  . Hemorrhoids   . History of kidney stones   . Hx of colonic polyps   . Hyperlipemia    takes Crestor daily  . Hypertension    takes Amlodipine and Metoprolol daily  . Insomnia   . LBBB (left bundle branch block)    Stress test 09/03/2010, EF 55  . Myocardial infarction   . PAD (peripheral artery disease) (HCC)    Carotid, subclavian, and lower extremity beds, currently not symptomatic  . Presence of combination internal cardiac defibrillator (ICD) and pacemaker   . Presence of permanent cardiac pacemaker   . S/P angioplasty with stent, lt. subclavian 07/31/11 08/01/2011  . Shortness of breath    when over exerting self per pt.  . Subclavian arterial stenosis, lt, with PTA/STENT 07/31/11 08/01/2011  . Syncope 07/28/2011   EF - 50-55, moderate concentric hypertrophy in left ventricle  . Urinary incontinence   . Vertigo    takes Meclizine prn    Past Surgical History:  Procedure Laterality Date  . ABDOMINAL AORTAGRAM N/A 08/12/2013   Procedure: ABDOMINAL Maxcine Ham;  Surgeon: Elam Dutch, MD;  Location: [facility] CATH LAB;  Service: Cardiovascular;  Laterality: N/A;  . ABDOMINAL HYSTERECTOMY    . APPENDECTOMY    . BACK SURGERY  2012  . BIV ICD GENERTAOR CHANGE OUT  Left 02/20/2012   Procedure: BIV ICD GENERTAOR CHANGE OUT;  Surgeon: [name], MD;  Location: [facility] CATH LAB;  Service: Cardiovascular;  Laterality: Left;  . CARDIAC CATHETERIZATION  12/01/2007   By Dr. Melvern Banker, left heart cath,   . CARDIAC DEFIBRILLATOR PLACEMENT  05/2008   By Dr Blanch Media, [employer] CANNOT HAVE MRI's  . CAROTID ANGIOGRAM N/A 07/31/2011   Procedure: CAROTID ANGIOGRAM;  Surgeon: Lorretta Harp, MD;  Location: [facility] CATH LAB;  Service:  Cardiovascular;  Laterality: N/A;  carotid angiogram and possible Lt SCA PTA  . COLONOSCOPY W/ POLYPECTOMY  12/2013  . CORONARY ANGIOPLASTY    . ENDARTERECTOMY Left 11/08/2014   Procedure: LEFT CAROTID ENDARTERECTOMY WITH HEMASHIELD PATCH ANGIOPLASTY;  Surgeon: Elam Dutch, MD;  Location: [place];  Service: Vascular;  Laterality: Left;  . ESOPHAGOGASTRODUODENOSCOPY N/A 01/20/2014   Procedure: ESOPHAGOGASTRODUODENOSCOPY (EGD);  Surgeon: Jerene Bears, MD;  Location: [facility] ENDOSCOPY;  Service: Endoscopy;  Laterality: N/A;  . FEMORAL-POPLITEAL BYPASS GRAFT Right 10/12/2013   Procedure:   Femoral-Peroneal trunk  bypass with nonreversed greater saphenous vein graft;  Surgeon: Elam Dutch, MD;  Location: [place];  Service: Vascular;  Laterality: Right;  . GIVENS CAPSULE STUDY N/A 01/20/2014   Procedure: GIVENS CAPSULE STUDY;  Surgeon: Jerene Bears, MD;  Location: [place];  Service: Gastroenterology;  Laterality: N/A;  . INTRAOPERATIVE ARTERIOGRAM Right 10/12/2013   Procedure: INTRA OPERATIVE ARTERIOGRAM;  Surgeon: Elam Dutch, MD;  Location: [place];  Service: Vascular;  Laterality: Right;  . ORIF ELBOW FRACTURE  08/16/2011   Procedure: OPEN REDUCTION INTERNAL FIXATION (ORIF) ELBOW/OLECRANON FRACTURE;  Surgeon: Schuyler Amor, MD;  Location: [place];  Service: Orthopedics;  Laterality: Left;  . RENAL ANGIOGRAM N/A 08/12/2013   Procedure: RENAL ANGIOGRAM;  Surgeon: Elam Dutch, MD;  Location: [facility] C[facility] LAB;  Service: Cardiovascular;  Laterality: N/A;  . SUBCLAVIAN STENT PLACEMENT Left 07/31/2011   7x18 Genesis, balloon, with reduction of 90% ostial left subclavian artery stenosis to 0% with residual excellent flow  . TONSILLECTOMY    . TUBAL LIGATION       reports that she has been smoking Cigarettes.  She has a 62.00 pack-year smoking history. She has never used smokeless tobacco. She reports that she does not drink alcohol or use drugs.  Allergies  Allergen Reactions  . Potassium-Containing  Compounds Other (See Comments)    Causes severe constipation  . Azithromycin Rash  . Codeine Itching  . Darvon Itching  . Erythromycin Rash  . Meloxicam Other (See Comments)    Unknown    . Norco [Hydrocodone-Acetaminophen] Itching  . Penicillins Rash    Has patient had a PCN reaction causing immediate rash, facial/tongue/throat swelling, SOB or lightheadedness with hypotension: unknown Has patient had a PCN reaction causing severe rash involving mucus membranes or skin necrosis: no Has patient had a PCN reaction that required hospitalization: unknown Has patient had a PCN reaction occurring within the last 10 years: no If all of the above answers are "NO", then may proceed with Cephalosporin use.   Marland Kitchen Propoxyphene N-Acetaminophen Itching  . Rofecoxib Other (See Comments)    Unknown    . Rosuvastatin Other (See Comments)    cramps  . Statins Itching and Other (See Comments)    Sleeplessness  . Sulfa Antibiotics Rash    Family History  Problem Relation Age of Onset  . CAD Father   . Heart disease Father   . Hyperlipidemia Father   . Heart disease Mother   .  Deep vein thrombosis Son   . Hyperlipidemia    . Colon cancer Maternal Grandmother   . Cancer Sister     ovarian  . Diabetes Sister   . Heart disease Sister   . Anesthesia problems Neg Hx   . Hypotension Neg Hx   . Malignant hyperthermia Neg Hx   . Pseudochol deficiency Neg Hx     Prior to Admission medications   Medication Sig Start Date End Date Taking? Authorizing Provider  ACCU-CHEK FASTCLIX LANCETS MISC USE   TO CHECK GLUCOSE ONCE DAILY 04/15/16  Yes Laurey Morale, MD  ACCU-CHEK SMARTVIEW test strip CHECK BLOOD SUGAR ONCE DAILY 08/09/15  Yes Laurey Morale, MD  albuterol (PROVENTIL HFA;VENTOLIN HFA) 108 (90 Base) MCG/ACT inhaler Inhale 2 puffs into the lungs every 4 (four) hours as needed for wheezing or shortness of breath. 10/12/15  Yes Laurey Morale, MD  allopurinol (ZYLOPRIM) 300 MG tablet Take 1 tablet (300  mg total) by mouth daily. 10/15/15  Yes Laurey Morale, MD  ALPRAZolam Duanne Moron) 1 MG tablet Take 1 tablet (1 mg total) by mouth every 6 (six) hours as needed for anxiety. 05/02/16  Yes Laurey Morale, MD  clopidogrel (PLAVIX) 75 MG tablet Take 1 tablet (75 mg total) by mouth daily. 06/27/15  Yes Laurey Morale, MD  DULoxetine (CYMBALTA) 60 MG capsule TAKE ONE CAPSULE BY MOUTH TWICE DAILY Patient taking differently: TAKE 60 MG BY MOUTH TWICE DAILY 02/04/16  Yes Laurey Morale, MD  fluticasone (CLARISPRAY) 50 MCG/ACT nasal spray Place 1 spray into both nostrils daily.   Yes Historical Provider, MD  fluticasone (FLONASE) 50 MCG/ACT nasal spray Place 1 spray into both nostrils daily as needed for allergies or rhinitis.   Yes Historical Provider, MD  furosemide (LASIX) 80 MG tablet Take 80 mg by mouth 2 (two) times daily.   Yes Historical Provider, MD  HYDROcodone-acetaminophen (NORCO) 10-325 MG tablet Take 1 tablet by mouth every 6 (six) hours as needed for moderate pain. 05/02/16  Yes Laurey Morale, MD  metoprolol succinate (TOPROL-XL) 25 MG 24 hr tablet Take 1 tablet (25 mg total) by mouth daily. 05/02/16  Yes Laurey Morale, MD  nitroGLYCERIN (NITROSTAT) 0.4 MG SL tablet Place 1 tablet (0.4 mg total) under the tongue every 5 (five) minutes x 3 doses as needed for chest pain. 10/12/15  Yes Laurey Morale, MD  furosemide (LASIX) 40 MG tablet Take 1 tablet (40 mg total) by mouth 2 (two) times daily. Patient not taking: Reported on 05/14/2016 05/02/16   Laurey Morale, MD  metoprolol tartrate (LOPRESSOR) 25 MG tablet Take 25 mg by mouth daily.    Historical Provider, MD    Physical Exam: Vitals:   05/14/16 1130 05/14/16 1200 05/14/16 1223 05/14/16 1230  BP: (!) 117/103 120/79 120/79 142/91  Pulse: 95 94 96 99  Resp: (!) 27 (!) 30 25 19   Temp:      TempSrc:      SpO2: 95% 95% 96% 100%  Weight:      Height:         Constitutional: NAD, calm, comfortable Eyes: PERRL, lids and conjunctivae normal ENMT:  Mucous membranes are moist. Posterior pharynx clear of any exudate or lesions.Normal dentition.  Neck: normal, supple, no masses, no thyromegaly Respiratory: mild wheezing with crackles. Normal respiratory effort. No accessory muscle use.  Cardiovascular: Regular rate and rhythm, no murmurs / rubs / gallops. No extremity edema. 2+ pedal pulses. Abdomen: no tenderness, no  masses palpated. Bowel sounds positive.  Musculoskeletal: no clubbing / cyanosis. No joint deformity of upper and lower extremities. Good ROM, no contractures. Normal muscle tone.  Skin: no rashes, lesions, ulcers. No induration Neurologic: CN 2-12 grossly intact. Sensation intact, DTR normal. Strength 5/5 in all 4.  Psychiatric: Slightly somnolent. Oriented to person and place. Difficult to assess orientation to time since patient was very sleepy.   Labs on Admission: I have personally reviewed following labs and imaging studies  CBC:  Recent Labs Lab 05/14/16 0909  WBC 18.1*  HGB 12.8  HCT 40.3  MCV 96.4  PLT [id number]   Basic Metabolic Panel:  Recent Labs Lab 05/14/16 0909 05/14/16 1208  NA 135  --   K 2.6*  --   CL 95*  --   CO2 28  --   GLUCOSE 174*  --   BUN 24*  --   CREATININE 1.64*  --   CALCIUM 8.6*  --   MG  --  1.9   GFR: Estimated Creatinine Clearance: 31.3 mL/min (by C-G formula based on SCr of 1.64 mg/dL (H)). Liver Function Tests: No results for input(s): AST, ALT, ALKPHOS, BILITOT, PROT, ALBUMIN in the last 168 hours. No results for input(s): LIPASE, AMYLASE in the last 168 hours. No results for input(s): AMMONIA in the last 168 hours. Coagulation Profile: No results for input(s): INR, PROTIME in the last 168 hours. Cardiac Enzymes:  Recent Labs Lab 05/14/16 1208  CKTOTAL 1,073*  TROPONINI 11.34*   BNP (last 3 results) No results for input(s): PROBNP in the last 8760 hours. HbA1C: No results for input(s): HGBA1C in the last 72 hours. CBG: No results for input(s): GLUCAP in the  last 168 hours. Lipid Profile: No results for input(s): CHOL, HDL, LDLCALC, TRIG, CHOLHDL, LDLDIRECT in the last 72 hours. Thyroid Function Tests: No results for input(s): TSH, T4TOTAL, FREET4, T3FREE, THYROIDAB in the last 72 hours. Anemia Panel: No results for input(s): VITAMINB12, FOLATE, FERRITIN, TIBC, IRON, RETICCTPCT in the last 72 hours. Urine analysis:    Component Value Date/Time   COLORURINE YELLOW 05/14/2016 [place] 05/14/2016 1208   LABSPEC 1.018 05/14/2016 1208   PHURINE 5.0 05/14/2016 1208   GLUCOSEU NEGATIVE 05/14/2016 1208   HGBUR NEGATIVE 05/14/2016 1208   HGBUR negative 01/01/2010 1353   BILIRUBINUR NEGATIVE 05/14/2016 1208   BILIRUBINUR n 05/30/2015 1542   KETONESUR NEGATIVE 05/14/2016 1208   PROTEINUR 30 (A) 05/14/2016 1208   UROBILINOGEN 0.2 05/30/2015 1542   UROBILINOGEN 1.0 11/03/2014 1442   NITRITE NEGATIVE 05/14/2016 1208   LEUKOCYTESUR NEGATIVE 05/14/2016 1208   Sepsis Labs: !!!!!!!!!!!!!!!!!!!!!!!!!!!!!!!!!!!!!!!!!!!! @LABRCNTIP (procalcitonin:4,lacticidven:4) )No results found for this or any previous visit (from the past 240 hour(s)).   Radiological Exams on Admission: Dg Chest 2 View  Result Date: 05/14/2016 CLINICAL DATA:  Recent falls, found on floor today, possibly hitting head, recent cough EXAM: CHEST  2 VIEW COMPARISON:  Chest x-ray of 04/09/2016 FINDINGS: There is little change in cardiomegaly, and there does appear to be mild pulmonary vascular congestion present. No focal infiltrate or effusion is seen. Pacer with AICD lead remains. The bones are osteopenic. IMPRESSION: Stable cardiomegaly.  Question mild pulmonary vascular congestion Electronically Signed   By: Ivar Drape M.D.   On: 05/14/2016 08:55   Ct Head Wo Contrast  Result Date: 05/14/2016 CLINICAL DATA:  Per EMS pt is from home. Son found her sitting on the floor next to her bed. Down time unknown. Pt has Hx of dementia and  COPD EXAM: CT HEAD WITHOUT CONTRAST CT  CERVICAL SPINE WITHOUT CONTRAST TECHNIQUE: Multidetector CT imaging of the head and cervical spine was performed following the standard protocol without intravenous contrast. Multiplanar CT image reconstructions of the cervical spine were also generated. COMPARISON:  02/26/2016 and previous FINDINGS: CT HEAD FINDINGS Brain: Diffuse parenchymal atrophy. Patchy areas of hypoattenuation in deep and periventricular white matter bilaterally. Negative for acute intracranial hemorrhage, mass lesion, acute infarction, midline shift, or mass-effect. Acute infarct may be inapparent on noncontrast CT. Ventricles and sulci symmetric. Vascular: Atherosclerotic and physiologic intracranial calcifications. Skull:  Bone windows demonstrate no focal lesion. Sinuses/Orbits: No acute finding. Other: None. CT CERVICAL SPINE FINDINGS Alignment: 2-3 mm anterolisthesis C4-5 and C5-6 probably secondary to asymmetric facet DJD. Skull base and vertebrae: No acute fracture. No primary bone lesion or focal pathologic process. Soft tissues and spinal canal: No prevertebral fluid or swelling. No visible canal hematoma. Disc levels: Central protrusion C2-3. Mild narrowing C5-6 with right posterolateral and foraminal partially calcified protrusion resulting in foraminal stenosis. Of uncovertebral facets side DJD results in foraminal encroachment bilaterally C3-4, left C4-5, right C5-6, C6-7, and C7-T1. Upper chest: Transvenous pacing leads are partially visualized. Visualized lung apices unremarkable. Bilateral carotid calcified plaque. Other: None IMPRESSION: 1. Negative for bleed or other acute intracranial process. 2. Atrophy and nonspecific white matter changes. 3. Negative for cervical fracture or other acute bone abnormality. 4. Multilevel degenerative changes as enumerated above. Electronically Signed   By: Lucrezia Europe M.D.   On: 05/14/2016 08:46   Ct Cervical Spine Wo Contrast  Result Date: 05/14/2016 CLINICAL DATA:  Per EMS pt is from  home. Son found her sitting on the floor next to her bed. Down time unknown. Pt has Hx of dementia and COPD EXAM: CT HEAD WITHOUT CONTRAST CT CERVICAL SPINE WITHOUT CONTRAST TECHNIQUE: Multidetector CT imaging of the head and cervical spine was performed following the standard protocol without intravenous contrast. Multiplanar CT image reconstructions of the cervical spine were also generated. COMPARISON:  02/26/2016 and previous FINDINGS: CT HEAD FINDINGS Brain: Diffuse parenchymal atrophy. Patchy areas of hypoattenuation in deep and periventricular white matter bilaterally. Negative for acute intracranial hemorrhage, mass lesion, acute infarction, midline shift, or mass-effect. Acute infarct may be inapparent on noncontrast CT. Ventricles and sulci symmetric. Vascular: Atherosclerotic and physiologic intracranial calcifications. Skull:  Bone windows demonstrate no focal lesion. Sinuses/Orbits: No acute finding. Other: None. CT CERVICAL SPINE FINDINGS Alignment: 2-3 mm anterolisthesis C4-5 and C5-6 probably secondary to asymmetric facet DJD. Skull base and vertebrae: No acute fracture. No primary bone lesion or focal pathologic process. Soft tissues and spinal canal: No prevertebral fluid or swelling. No visible canal hematoma. Disc levels: Central protrusion C2-3. Mild narrowing C5-6 with right posterolateral and foraminal partially calcified protrusion resulting in foraminal stenosis. Of uncovertebral facets side DJD results in foraminal encroachment bilaterally C3-4, left C4-5, right C5-6, C6-7, and C7-T1. Upper chest: Transvenous pacing leads are partially visualized. Visualized lung apices unremarkable. Bilateral carotid calcified plaque. Other: None IMPRESSION: 1. Negative for bleed or other acute intracranial process. 2. Atrophy and nonspecific white matter changes. 3. Negative for cervical fracture or other acute bone abnormality. 4. Multilevel degenerative changes as enumerated above. Electronically Signed    By: Lucrezia Europe M.D.   On: 05/14/2016 08:46    EKG: Independently reviewed. Paced rhythm  Assessment/Plan Active Problems:   Essential hypertension   Coronary atherosclerosis   Hypokalemia   NSTEMI (non-ST elevated myocardial infarction) (HCC)   COPD exacerbation (HCC)  NSTEMI Troponin of 11. EKG paced. No chest pain. Cardiology consulted and heparin started in ED. No chest pain. Was previously on an ACEi but was taken off secondary to hypotension. -continue heparin per pharmacy -continue metoprolol -cardiology recommendations -NPO after midnight in case intervention warranted in AM  Dyspnea Chronic hypoxic respiratory failure Appears patient may have mild COPD exacerbation. On 2L O2 at baseline.  -Duoneb q6hrs -albterol prn -doxycycline -prednisone -mucinex -flutter valve  Altered mental status Likely made worse secondary to ?NSTEMI. Did not respond to Narcan. Mostly somnolent -monitor for improvement  CAD Chronic combined CHF AICD Followed by cardiology outpatient. Significant CAD history. History of EF of 20% in 2009. Last EF of 40-45% with diffuse hypokinesis and grade one diastolic dysfunction in [id number]. Appears euvolemic. -daily weights -in/out -have AICD interrogated  CKD stage III Seems to be at about baseline  Hypokalemia S/p 33meq potassium in IV fluid -repeat BMP   DVT prophylaxis: Heparin gtt Code Status: Full code Family Communication: Son at bedside Disposition Plan: Pending workup Consults called: Cardiology called by EDP Admission status: Inpatient, telemetry   Cordelia Poche, MD [facility] Pager [phone]  If 7PM-7AM, please contact night-coverage [email or web address] Password [email or web address]  05/14/2016, 2:05 PM

## 2016-05-14 NOTE — Telephone Encounter (Signed)
Per chart pt taken to [facility] ED today. NSTEMI and to be admitted to [facility] unit.   Dr. Sarajane Jews - Juluis Rainier

## 2016-05-14 NOTE — ED Notes (Signed)
Pt's 02 decreased to 3L [place]. Pt maintaining sat 93%.

## 2016-05-14 NOTE — ED Notes (Signed)
Pt on 4L O2 via nasal cannula

## 2016-05-14 NOTE — ED Notes (Signed)
Bed: [id number] Expected date:  Expected time:  Means of arrival:  Comments: EMS 80 yo, fall

## 2016-05-14 NOTE — Telephone Encounter (Signed)
LMTCB

## 2016-05-14 NOTE — Progress Notes (Signed)
ANTICOAGULATION CONSULT NOTE - Initial Consult  Pharmacy Consult for Heparin Indication: chest pain/ACS  Allergies  Allergen Reactions  . Potassium-Containing Compounds Other (See Comments)    Causes severe constipation  . Azithromycin Rash  . Codeine Itching  . Darvon Itching  . Erythromycin Rash  . Meloxicam Other (See Comments)    Unknown    . Norco [Hydrocodone-Acetaminophen] Itching  . Penicillins Rash    Has patient had a PCN reaction causing immediate rash, facial/tongue/throat swelling, SOB or lightheadedness with hypotension: unknown Has patient had a PCN reaction causing severe rash involving mucus membranes or skin necrosis: no Has patient had a PCN reaction that required hospitalization: unknown Has patient had a PCN reaction occurring within the last 10 years: no If all of the above answers are "NO", then may proceed with Cephalosporin use.   Marland Kitchen Propoxyphene N-Acetaminophen Itching  . Rofecoxib Other (See Comments)    Unknown    . Rosuvastatin Other (See Comments)    cramps  . Statins Itching and Other (See Comments)    Sleeplessness  . Sulfa Antibiotics Rash    Patient Measurements: Height: 5\' 8"  (172.7 cm) Weight: 175 lb (79.4 kg) IBW/kg (Calculated) : 63.9 Heparin Dosing Weight: use actual weight  Vital Signs: Temp: 97.9 F (36.6 C) (11/29 0710) Temp Source: Oral (11/29 0710) BP: 142/91 (11/29 1230) Pulse Rate: 99 (11/29 1230)  Labs:  Recent Labs  05/14/16 0909 05/14/16 1208  HGB 12.8  --   HCT 40.3  --   PLT 342  --   CREATININE 1.64*  --   CKTOTAL  --  1,073*  TROPONINI  --  11.34*    Estimated Creatinine Clearance: 31.3 mL/min (by C-G formula based on SCr of 1.64 mg/dL (H)).   Medical History: Past Medical History:  Diagnosis Date  . Arthritis   . Automatic implantable cardioverter-defibrillator in situ   . Bronchitis   . CAD (coronary artery disease)    Currently angina free, no evidence of reversible ischemia  . Chronic back  pain   . Chronic constipation   . Colon polyps 2003.  2015.   HP polyps 2003.  adnomas 2015.  required referal to [facility] for colonoscopic resection of flat polyps.   . Congestive heart failure (CHF) (HCC)    New York Heart Association functional class 2, diastolic dysfunction  . COPD (chronic obstructive pulmonary disease) ([place])   . Depression with anxiety    takes Cymbalta daily  . Diabetes mellitus without complication (HCC)    no meds  . Early cataracts, bilateral   . Fibromyalgia   . GERD (gastroesophageal reflux disease)    was on meds but was taken off;now watches what she eats  . Hemorrhoids   . History of kidney stones   . Hx of colonic polyps   . Hyperlipemia    takes Crestor daily  . Hypertension    takes Amlodipine and Metoprolol daily  . Insomnia   . LBBB (left bundle branch block)    Stress test 09/03/2010, EF 55  . Myocardial infarction   . PAD (peripheral artery disease) (HCC)    Carotid, subclavian, and lower extremity beds, currently not symptomatic  . Presence of combination internal cardiac defibrillator (ICD) and pacemaker   . Presence of permanent cardiac pacemaker   . S/P angioplasty with stent, lt. subclavian 07/31/11 08/01/2011  . Shortness of breath    when over exerting self per pt.  . Subclavian arterial stenosis, lt, with PTA/STENT 07/31/11 08/01/2011  .  Syncope 07/28/2011   EF - 50-55, moderate concentric hypertrophy in left ventricle  . Urinary incontinence   . Vertigo    takes Meclizine prn    Medications:  Infusions:   Assessment: 35 yoF referred to ED with productive cough.  She has a cardiac hx & was on Plavix PTA.  Starting heparin for elevated troponin.  CBC wnl.  Baseline aPtt pending.  No bleeding noted.   Goal of Therapy:  Heparin level 0.3-0.7 units/ml Monitor platelets by anticoagulation protocol: Yes   Plan:  Give 4000 units bolus x 1 Start heparin infusion at 1000 units/hr Check anti-Xa level in 8 hours and daily while on  heparin Continue to monitor H&H and platelets  Netta Cedars, PharmD, BCPS Pager: [phone] 05/14/2016,1:41 PM

## 2016-05-14 NOTE — Progress Notes (Signed)
Report given to [place] RN, pt transported via stretcher by [place], [place] and [place] NT

## 2016-05-14 NOTE — ED Notes (Signed)
ED Provider at bedside. 

## 2016-05-14 NOTE — Consult Note (Signed)
CARDIOLOGY CONSULT NOTE   Patient ID: Sydney Phillips MRN: [id number], DOB/AGE: 02-12-1936   Admit date: 05/14/2016 Date of Consult: 05/14/2016   Primary Physician: Laurey Morale, MD Primary Cardiologist: Dr. Sallyanne Kuster  Pt. Profile  Sydney Phillips a pleasant 80 year old Caucasian with PMH of HTN, HLD, LBBB, CAD, h/o mixed cardiomyopathy s/p Medtronic CRT-D (original ICD 2009, s/p device change out 2013), carotid artery disease s/p L CEA, history of L subclavian artery stenosis s/p stent in February 2013, history of lower extremity PAD s/p right fem-pop 2015 presented with acute respiratory failure after being found down in the floor by son for unknown duration. Troponin was 11 with total CK 1000 on arrival. She denies any recent chest pain. Cardiology was consulted for elevated troponin.  Problem List  Past Medical History:  Diagnosis Date  . Arthritis   . Automatic implantable cardioverter-defibrillator in situ   . Bronchitis   . CAD (coronary artery disease)    Currently angina free, no evidence of reversible ischemia  . Chronic back pain   . Chronic constipation   . Colon polyps 2003.  2015.   HP polyps 2003.  adnomas 2015.  required referal to [facility] for colonoscopic resection of flat polyps.   . Congestive heart failure (CHF) (HCC)    New York Heart Association functional class 2, diastolic dysfunction  . COPD (chronic obstructive pulmonary disease) ([place])   . Depression with anxiety    takes Cymbalta daily  . Diabetes mellitus without complication (HCC)    no meds  . Early cataracts, bilateral   . Fibromyalgia   . GERD (gastroesophageal reflux disease)    was on meds but was taken off;now watches what she eats  . Hemorrhoids   . History of kidney stones   . Hx of colonic polyps   . Hyperlipemia    takes Crestor daily  . Hypertension    takes Amlodipine and Metoprolol daily  . Insomnia   . LBBB (left bundle branch block)    Stress test 09/03/2010, EF 55  .  Myocardial infarction   . PAD (peripheral artery disease) (HCC)    Carotid, subclavian, and lower extremity beds, currently not symptomatic  . Presence of combination internal cardiac defibrillator (ICD) and pacemaker   . Presence of permanent cardiac pacemaker   . S/P angioplasty with stent, lt. subclavian 07/31/11 08/01/2011  . Shortness of breath    when over exerting self per pt.  . Subclavian arterial stenosis, lt, with PTA/STENT 07/31/11 08/01/2011  . Syncope 07/28/2011   EF - 50-55, moderate concentric hypertrophy in left ventricle  . Urinary incontinence   . Vertigo    takes Meclizine prn    Past Surgical History:  Procedure Laterality Date  . ABDOMINAL AORTAGRAM N/A 08/12/2013   Procedure: ABDOMINAL Maxcine Ham;  Surgeon: Elam Dutch, MD;  Location: [facility] CATH LAB;  Service: Cardiovascular;  Laterality: N/A;  . ABDOMINAL HYSTERECTOMY    . APPENDECTOMY    . BACK SURGERY  2012  . BIV ICD GENERTAOR CHANGE OUT Left 02/20/2012   Procedure: BIV ICD GENERTAOR CHANGE OUT;  Surgeon: [name], MD;  Location: [facility] CATH LAB;  Service: Cardiovascular;  Laterality: Left;  . CARDIAC CATHETERIZATION  12/01/2007   By Dr. Melvern Banker, left heart cath,   . CARDIAC DEFIBRILLATOR PLACEMENT  05/2008   By Dr Blanch Media, Medtronic CANNOT HAVE MRI's  . CAROTID ANGIOGRAM N/A 07/31/2011   Procedure: CAROTID ANGIOGRAM;  Surgeon: Lorretta Harp, MD;  Location: [facility] C[facility] LAB;  Service:  Cardiovascular;  Laterality: N/A;  carotid angiogram and possible Lt SCA PTA  . COLONOSCOPY W/ POLYPECTOMY  12/2013  . CORONARY ANGIOPLASTY    . ENDARTERECTOMY Left 11/08/2014   Procedure: LEFT CAROTID ENDARTERECTOMY WITH HEMASHIELD PATCH ANGIOPLASTY;  Surgeon: Elam Dutch, MD;  Location: [place];  Service: Vascular;  Laterality: Left;  . ESOPHAGOGASTRODUODENOSCOPY N/A 01/20/2014   Procedure: ESOPHAGOGASTRODUODENOSCOPY (EGD);  Surgeon: Jerene Bears, MD;  Location[facility] ENDOSCOPY;  Service: Endoscopy;  Laterality: N/A;  .  FEMORAL-POPLITEAL BYPASS GRAFT Right 10/12/2013   Procedure:   Femoral-Peroneal trunk  bypass with nonreversed greater saphenous vein graft;  Surgeon: Elam Dutch, MD;  Location: [place];  Service: Vascular;  Laterality: Right;  . GIVENS CAPSULE STUDY N/A 01/20/2014   Procedure: GIVENS CAPSULE STUDY;  Surgeon: Jerene Bears, MD;  Location: [place];  Service: Gastroenterology;  Laterality: N/A;  . INTRAOPERATIVE ARTERIOGRAM Right 10/12/2013   Procedure: INTRA OPERATIVE ARTERIOGRAM;  Surgeon: Elam Dutch, MD;  Location: [place];  Service: Vascular;  Laterality: Right;  . ORIF ELBOW FRACTURE  08/16/2011   Procedure: OPEN REDUCTION INTERNAL FIXATION (ORIF) ELBOW/OLECRANON FRACTURE;  Surgeon: Schuyler Amor, MD;  Location: [place];  Service: Orthopedics;  Laterality: Left;  . RENAL ANGIOGRAM N/A 08/12/2013   Procedure: RENAL ANGIOGRAM;  Surgeon: Elam Dutch, MD;  Location: [facility] CATH LAB;  Service: Cardiovascular;  Laterality: N/A;  . SUBCLAVIAN STENT PLACEMENT Left 07/31/2011   7x18 [place], balloon, with reduction of 90% ostial left subclavian artery stenosis to 0% with residual excellent flow  . TONSILLECTOMY    . TUBAL LIGATION       Allergies  Allergies  Allergen Reactions  . Potassium-Containing Compounds Other (See Comments)    Causes severe constipation  . Azithromycin Rash  . Codeine Itching  . Darvon Itching  . Erythromycin Rash  . Meloxicam Other (See Comments)    Unknown    . Norco [Hydrocodone-Acetaminophen] Itching  . Penicillins Rash    Has patient had a PCN reaction causing immediate rash, facial/tongue/throat swelling, SOB or lightheadedness with hypotension: unknown Has patient had a PCN reaction causing severe rash involving mucus membranes or skin necrosis: no Has patient had a PCN reaction that required hospitalization: unknown Has patient had a PCN reaction occurring within the last 10 years: no If all of the above answers are "NO", then may proceed with  Cephalosporin use.   Marland Kitchen Propoxyphene N-Acetaminophen Itching  . Rofecoxib Other (See Comments)    Unknown    . Rosuvastatin Other (See Comments)    cramps  . Statins Itching and Other (See Comments)    Sleeplessness  . Sulfa Antibiotics Rash    HPI   Sydney Phillips a pleasant 80 year old Caucasian with PMH of HTN, HLD, LBBB, CAD, h/o mixed cardiomyopathy s/p Medtronic CRT-D (original ICD 2009, s/p device change out 2013), carotid artery disease s/p L CEA, history of L subclavian artery stenosis s/p stent in February 2013, history of lower extremity PAD s/p right fem-pop 2015. Based on previous cardiology office note, her ejection fraction in 2009 was 20%, cardiac catheterization revealed 75% LAD, 75% RCA stenosis. She responded quite well to CRT therapy, her ejection fraction has returned back to normal range by 2013. She had a repeat echocardiogram in that also showed normal EF. Last myocardial perfusion study in August 2015 showed no ischemia, EF 56%. Patient has had 2 recent admissions in Sept and Oct 2017. In Sept, she was seen for possible dehydration and frequent fall. Her  renal function improved after holding ACE inhibitor and diuretic. Echocardiogram obtained on 02/27/2016 does show the ejection fraction went down again to 40-45%. She was fine and appears to be euvolemic when she was seen by her PCP after discharge. However a month later, she returned in October with acute respiratory failure felt to be related to decompensated heart failure. She was aggressively diuresed with Lasix and metolazone. She was also treated for COPD exacerbation during the same hospitalization. Based on the phone note on 04/22/2016, she was gaining somewhat back after discharge, per Dr. Sallyanne Kuster, her baseline weight should be around 176 pounds, she was instructed to take additional 40 mg Lasix if her weight increased above 176 pounds otherwise continue on 40 mg twice a day of Lasix.  According to the patient, she  continued to smoke for close to 60 years now. She has been having worsening yellow productive cough for the past 5 weeks. She did remember going to the bed last night, however does not remember anything after that. Her son found her on the floor this morning, she does not remember how long she was on the floor. She says she does remember having chest discomfort prior to the previous cath many years ago, however has not had any chest pain recently. On arrival, she was 84% on 2 L nasal cannula. Initial labs were finding was significant for potassium 2.6, creatinine 1.64 which is close to her baseline, total CK 1073, troponin 11.34, white blood cell count elevated at 18.1. There is many bacteria in the urine with mucus present. Urinalysis positive for benzodiazepine and opioid. She was given a dose of Narcan due to altered mental status. Due to elevated troponin, she was also started on IV heparin. Cardiology was consulted for elevated troponin.   Inpatient Medications  . aspirin  324 mg Oral Once    Family History Family History  Problem Relation Age of Onset  . CAD Father   . Heart disease Father   . Hyperlipidemia Father   . Heart disease Mother   . Deep vein thrombosis Son   . Hyperlipidemia    . Colon cancer Maternal Grandmother   . Cancer Sister     ovarian  . Diabetes Sister   . Heart disease Sister   . Anesthesia problems Neg Hx   . Hypotension Neg Hx   . Malignant hyperthermia Neg Hx   . Pseudochol deficiency Neg Hx      Social History Social History   Social History  . Marital status: Widowed    Spouse name: N/A  . Number of children: 3  . Years of education: N/A   Occupational History  . Not on file.   Social History Main Topics  . Smoking status: Current Every Day Smoker    Packs/day: 1.00    Years: 62.00    Types: Cigarettes  . Smokeless tobacco: Never Used     Comment: Down to 3-4 cigarettes per day  . Alcohol use No  . Drug use: No  . Sexual activity: No     Other Topics Concern  . Not on file   Social History Narrative   Ok to share information with medical POA, Son Gabriel Carina     Review of Systems  General:  No chills, fever, night sweats or weight changes.  Cardiovascular:  No chest pain, edema, orthopnea, palpitations, paroxysmal nocturnal dyspnea. +dyspnea Dermatological: No rash, lesions/masses Respiratory: + yellow thick productive cough, dyspnea Urologic: No hematuria, dysuria Abdominal:   No  nausea, vomiting, diarrhea, bright red blood per rectum, melena, or hematemesis Neurologic:  No visual changes  +wkns, changes in mental status. All other systems reviewed and are otherwise negative except as noted above.  Physical Exam  Blood pressure (!) 138/104, pulse 99, temperature 97.9 F (36.6 C), temperature source Oral, resp. rate (!) 29, height 5\' 8"  (1.727 m), weight 175 lb (79.4 kg), SpO2 90 %.  General: Pleasant, NAD Psych: Normal affect. Neuro: Alert and oriented X 3. Moves all extremities spontaneously. HEENT: Normal  Neck: Supple without bruits or JVD. Lungs:  Noticeable bilateral rhonchi on physical exam with expiratory wheezing as well. Heart: RRR no s3, s4, or murmurs. Abdomen: Soft, non-tender, non-distended, BS + x 4.  Extremities: No clubbing, cyanosis or edema. DP/PT/Radials 2+ and equal bilaterally.  Labs   Recent Labs  05/14/16 1208  CKTOTAL 1,073*  TROPONINI 11.34*   Lab Results  Component Value Date   WBC 18.1 (H) 05/14/2016   HGB 12.8 05/14/2016   HCT 40.3 05/14/2016   MCV 96.4 05/14/2016   PLT 342 05/14/2016    Recent Labs Lab 05/14/16 0909  NA 135  K 2.6*  CL 95*  CO2 28  BUN 24*  CREATININE 1.64*  CALCIUM 8.6*  GLUCOSE 174*   Lab Results  Component Value Date   CHOL 228 (H) 05/30/2015   HDL 33.30 (L) 05/30/2015   LDLCALC 148 (H) 01/21/2014   TRIG 262.0 (H) 05/30/2015   Lab Results  Component Value Date   DDIMER 2.46 (H) 04/07/2016    Radiology/Studies  Dg Chest 2  View  Result Date: 05/14/2016 CLINICAL DATA:  Recent falls, found on floor today, possibly hitting head, recent cough EXAM: CHEST  2 VIEW COMPARISON:  Chest x-ray of 04/09/2016 FINDINGS: There is little change in cardiomegaly, and there does appear to be mild pulmonary vascular congestion present. No focal infiltrate or effusion is seen. Pacer with AICD lead remains. The bones are osteopenic. IMPRESSION: Stable cardiomegaly.  Question mild pulmonary vascular congestion Electronically Signed   By: Ivar Drape M.D.   On: 05/14/2016 08:55   Dg Ribs Unilateral W/chest Left  Result Date: 05/14/2016 CLINICAL DATA:  Left-sided chest pain following recent fall, initial encounter EXAM: LEFT RIBS AND CHEST - 3+ VIEW COMPARISON:  05/14/2016 FINDINGS: Cardiac shadow is again enlarged. A defibrillator is again seen. Poor inspiratory effort is noted with crowding of the vascular markings. Calcified granuloma is noted in the left lung. No acute rib fracture or pneumothorax is noted. IMPRESSION: No evidence of acute rib fracture. Electronically Signed   By: Inez Catalina M.D.   On: 05/14/2016 14:06   Ct Head Wo Contrast  Result Date: 05/14/2016 CLINICAL DATA:  Per EMS pt is from home. Son found her sitting on the floor next to her bed. Down time unknown. Pt has Hx of dementia and COPD EXAM: CT HEAD WITHOUT CONTRAST CT CERVICAL SPINE WITHOUT CONTRAST TECHNIQUE: Multidetector CT imaging of the head and cervical spine was performed following the standard protocol without intravenous contrast. Multiplanar CT image reconstructions of the cervical spine were also generated. COMPARISON:  02/26/2016 and previous FINDINGS: CT HEAD FINDINGS Brain: Diffuse parenchymal atrophy. Patchy areas of hypoattenuation in deep and periventricular white matter bilaterally. Negative for acute intracranial hemorrhage, mass lesion, acute infarction, midline shift, or mass-effect. Acute infarct may be inapparent on noncontrast CT. Ventricles and  sulci symmetric. Vascular: Atherosclerotic and physiologic intracranial calcifications. Skull:  Bone windows demonstrate no focal lesion. Sinuses/Orbits: No acute finding. Other: None.  CT CERVICAL SPINE FINDINGS Alignment: 2-3 mm anterolisthesis C4-5 and C5-6 probably secondary to asymmetric facet DJD. Skull base and vertebrae: No acute fracture. No primary bone lesion or focal pathologic process. Soft tissues and spinal canal: No prevertebral fluid or swelling. No visible canal hematoma. Disc levels: Central protrusion C2-3. Mild narrowing C5-6 with right posterolateral and foraminal partially calcified protrusion resulting in foraminal stenosis. Of uncovertebral facets side DJD results in foraminal encroachment bilaterally C3-4, left C4-5, right C5-6, C6-7, and C7-T1. Upper chest: Transvenous pacing leads are partially visualized. Visualized lung apices unremarkable. Bilateral carotid calcified plaque. Other: None IMPRESSION: 1. Negative for bleed or other acute intracranial process. 2. Atrophy and nonspecific white matter changes. 3. Negative for cervical fracture or other acute bone abnormality. 4. Multilevel degenerative changes as enumerated above. Electronically Signed   By: Lucrezia Europe M.D.   On: 05/14/2016 08:46   Ct Cervical Spine Wo Contrast  Result Date: 05/14/2016 CLINICAL DATA:  Per EMS pt is from home. Son found her sitting on the floor next to her bed. Down time unknown. Pt has Hx of dementia and COPD EXAM: CT HEAD WITHOUT CONTRAST CT CERVICAL SPINE WITHOUT CONTRAST TECHNIQUE: Multidetector CT imaging of the head and cervical spine was performed following the standard protocol without intravenous contrast. Multiplanar CT image reconstructions of the cervical spine were also generated. COMPARISON:  02/26/2016 and previous FINDINGS: CT HEAD FINDINGS Brain: Diffuse parenchymal atrophy. Patchy areas of hypoattenuation in deep and periventricular white matter bilaterally. Negative for acute  intracranial hemorrhage, mass lesion, acute infarction, midline shift, or mass-effect. Acute infarct may be inapparent on noncontrast CT. Ventricles and sulci symmetric. Vascular: Atherosclerotic and physiologic intracranial calcifications. Skull:  Bone windows demonstrate no focal lesion. Sinuses/Orbits: No acute finding. Other: None. CT CERVICAL SPINE FINDINGS Alignment: 2-3 mm anterolisthesis C4-5 and C5-6 probably secondary to asymmetric facet DJD. Skull base and vertebrae: No acute fracture. No primary bone lesion or focal pathologic process. Soft tissues and spinal canal: No prevertebral fluid or swelling. No visible canal hematoma. Disc levels: Central protrusion C2-3. Mild narrowing C5-6 with right posterolateral and foraminal partially calcified protrusion resulting in foraminal stenosis. Of uncovertebral facets side DJD results in foraminal encroachment bilaterally C3-4, left C4-5, right C5-6, C6-7, and C7-T1. Upper chest: Transvenous pacing leads are partially visualized. Visualized lung apices unremarkable. Bilateral carotid calcified plaque. Other: None IMPRESSION: 1. Negative for bleed or other acute intracranial process. 2. Atrophy and nonspecific white matter changes. 3. Negative for cervical fracture or other acute bone abnormality. 4. Multilevel degenerative changes as enumerated above. Electronically Signed   By: Lucrezia Europe M.D.   On: 05/14/2016 08:46    ECG  Paced rhythm, difficult to assess ST-T wave changes.  ASSESSMENT AND PLAN  1. Elevated trop: occurred in the setting of acute respiratory failure and the unknown [place] on [place] floor. Total CK was over 1000, however majority of the elevated troponin likely is related to demand ischemia. She has not had any ischemic workup for many years, and that this can be done electively.   - She needs to be treated aggressively for COPD exacerbation first. We will continue to trend troponin and obtain a limited echo to reassess ejection  fraction.   - Continue IV heparin.  - Once her breathing improved, we will then consider further workup  2. Acute respiratory failure likely COPD exacerbation  - Her weight is close to her baseline, she appears to be fairly euvolemic based on physical exam, however she has significant bilateral  rhonchi on auscultation. Likely etiology is pulmonary in nature. Lower suspicion for acute heart failure exacerbation.  3. CAD: cath revealed 75% LAD, 75% RCA stenosis. I am unable to locate the cath report, unknown intervention  4. HTN 5. HLD 6. h/o mixed cardiomyopathy s/p Medtronic CRT-D (original ICD 2009, s/p device change out 2013)  - ejection fraction has improved based on echocardiogram in 2013 2015, however echocardiogram obtained in September shows her ejection fraction went down again to 40-45%. 7. carotid artery disease s/p L CEA 8. history of L subclavian artery stenosis s/p stent in February 2013 9. history of lower extremity PAD s/p right fem-pop 2015  Signed, Almyra Deforest, PA-C 05/14/2016, 4:15 PM   I have personally seen and examined this patient with Almyra Deforest, PA-C. I agree with the assessment and plan as outlined above. She is known to have severe vascular disease with prior CEA, subclavian stenting, fem-pop bypass. She is also known to have moderately severe CAD by cath in 2009(cath report not available but reported, likely done in [facility] outpt cath lab). Now admitted after being found down by her son and with c/o 5 weeks of dyspnea, cough productive of yellow sputum, elevated WBC count, hypoxia. Her troponin was also elevated but she has no chest pain.  My exam shows a WDWN female who is somnolent but oriented x 3. CV:RRR without loud murmur. Lungs: coarse BS bilaterally with wheezing and poor air movement. Ext: no edema.  Baseline weight is 176 lbs. She is at 175 lbs today. No LE edema reported at home.  EKG with paced rhythm.  Labs reviewed She was found down by her son today. CK  level over 1000. Troponin 11. She does not recall passing out. She is still dyspneic.  I suspect that her presentation is in large part due to exacerbation of her COPD. I would recommend nebs, steroids and antibiotics. Consider Pulmonary consult.  She has underlying CAD which could certainly explain her elevated troponin in setting of a prolonged period of down time with unknown oxygen saturations and BP during that time. I would cycle troponin. Continue IV heparin. Limited echo in am to assess LVEF. She may need an ischemic evaluation when her respiratory issues have improved. She is not a good candidate for cardiac cath currently nor is there an urgent indication for a cath.   We will follow with you.   Lauree Chandler 05/14/2016 5:07 PM

## 2016-05-14 NOTE — ED Notes (Signed)
Pt's son reports the Pt has fallen several times recently.  Sts he found her on the floor this morning.  Sts "I think, she hit her head."  Regarding cough, Pt's son sts "she's been cough for 4-5 days, but she won't take anything."  Pt is typically on 2L [place].  Pt is 84% on 2L [place].  O2 increased.  Congested cough noted.

## 2016-05-14 NOTE — ED Notes (Signed)
Patient transported to CT 

## 2016-05-14 NOTE — ED Triage Notes (Signed)
Per EMS pt is from home. Son found her sitting on the floor next to her bed. Down time unknown. Pt has Hx of dementia and COPD. Son reports pt has fallen multiple times over the last couple weeks. No obvious injuries.

## 2016-05-14 NOTE — ED Provider Notes (Addendum)
[facility] DEPT Provider Note   CSN: [id number] Arrival date & time: 05/14/16  0703     History   Chief Complaint Chief Complaint  Patient presents with  . Fall  . Cough    HPI Sydney Phillips is a 80 y.o. female.  HPI Pt comes in with cc of fall. Pt has hx of CAD, COPD, fibromyalgia and is on pain meds. Pt comes in post fall, 3rd in a week per son. Pt is noted to be somnolent. Per son, pt at baseline is active. When he came home, he noted that her mother was on the floor.  Pt is arousable. She denies chest pain, cough, abd pain, uti like symptoms, headaches, neck pain.  Past Medical History:  Diagnosis Date  . Arthritis   . Automatic implantable cardioverter-defibrillator in situ   . Bronchitis   . CAD (coronary artery disease)    Currently angina free, no evidence of reversible ischemia  . Chronic back pain   . Chronic constipation   . Colon polyps 2003.  2015.   HP polyps 2003.  adnomas 2015.  required referal to [facility] for colonoscopic resection of flat polyps.   . Congestive heart failure (CHF) (HCC)    New York Heart Association functional class 2, diastolic dysfunction  . COPD (chronic obstructive pulmonary disease) ([place])   . Depression with anxiety    takes Cymbalta daily  . Diabetes mellitus without complication (HCC)    no meds  . Early cataracts, bilateral   . Fibromyalgia   . GERD (gastroesophageal reflux disease)    was on meds but was taken off;now watches what she eats  . Hemorrhoids   . History of kidney stones   . Hx of colonic polyps   . Hyperlipemia    takes Crestor daily  . Hypertension    takes Amlodipine and Metoprolol daily  . Insomnia   . LBBB (left bundle branch block)    Stress test 09/03/2010, EF 55  . Myocardial infarction   . PAD (peripheral artery disease) (HCC)    Carotid, subclavian, and lower extremity beds, currently not symptomatic  . Presence of combination internal cardiac defibrillator (ICD) and pacemaker   .  Presence of permanent cardiac pacemaker   . S/P angioplasty with stent, lt. subclavian 07/31/11 08/01/2011  . Shortness of breath    when over exerting self per pt.  . Subclavian arterial stenosis, lt, with PTA/STENT 07/31/11 08/01/2011  . Syncope 07/28/2011   EF - 50-55, moderate concentric hypertrophy in left ventricle  . Urinary incontinence   . Vertigo    takes Meclizine prn    Patient Active Problem List   Diagnosis Date Noted  . Pleural effusion   . Acute on chronic respiratory failure with hypoxia ([place])   . CHF exacerbation ([place]) 04/06/2016  . Acute respiratory distress 04/06/2016  . Altered mental state 02/26/2016  . Syncopal episodes 02/26/2016  . Acute kidney injury ([place]) 02/26/2016  . Skin lesion of back/upper right shoulder 02/26/2016  . Cardiomyopathy ([place]) 11/07/2015  . Smoking 11/07/2015  . Arthralgia 10/12/2015  . Left carotid stenosis 11/08/2014  . Diabetes mellitus without complication ([place]) [id number]  . NSTEMI (non-ST elevated myocardial infarction) ([place]) 01/20/2014  . Melena 01/20/2014  . Microcytic anemia 01/19/2014  . Chest pain 01/19/2014  . PAD (peripheral artery disease) ([place]) 10/12/2013  . S/P angioplasty with stent, lt. subclavian 07/31/11 08/01/2011  . Biventricular implantable cardioverter-defibrillator in situ 07/28/2011  . PVD, known severe, (previuosly asymptomatic) LSCA disease, now  with "high grade" RICA disease. 07/28/2011  . Bronchitis, recent flare 07/28/2011  . Syncope,possible related to subclavian steal syndrome 07/27/2011  . Vertigo 07/27/2011  . Hypokalemia 07/27/2011  . SPINAL STENOSIS 01/28/2010  . CONSTIPATION 01/14/2010  . BACK PAIN, LUMBAR 01/01/2010  . INSOMNIA 01/23/2009  . ALLERGIC RHINITIS 11/10/2008  . Coronary atherosclerosis 06/21/2008  . HIP PAIN, BILATERAL 06/21/2008  . Myalgia and myositis 11/26/2007  . HLD (hyperlipidemia) 07/28/2007  . ACUTE SINUSITIS, UNSPECIFIED 07/28/2007  . WEIGHT GAIN 06/04/2007  . DYSPNEA  06/04/2007  . COPD (chronic obstructive pulmonary disease) with emphysema ([place]) 03/02/2007  . Depression 02/25/2007  . Essential hypertension 02/25/2007  . GERD 02/25/2007  . COLONIC POLYPS, HX OF 02/25/2007    Past Surgical History:  Procedure Laterality Date  . ABDOMINAL AORTAGRAM N/A 08/12/2013   Procedure: ABDOMINAL Maxcine Ham;  Surgeon: Elam Dutch, MD;  Location: [facility] CATH LAB;  Service: Cardiovascular;  Laterality: N/A;  . ABDOMINAL HYSTERECTOMY    . APPENDECTOMY    . BACK SURGERY  2012  . BIV ICD GENERTAOR CHANGE OUT Left 02/20/2012   Procedure: BIV ICD GENERTAOR CHANGE OUT;  Surgeon: [name], MD;  Location: [facility] CATH LAB;  Service: Cardiovascular;  Laterality: Left;  . CARDIAC CATHETERIZATION  12/01/2007   By Dr. Melvern Banker, left heart cath,   . CARDIAC DEFIBRILLATOR PLACEMENT  05/2008   By Dr Blanch Media, [employer] CANNOT HAVE MRI's  . CAROTID ANGIOGRAM N/A 07/31/2011   Procedure: CAROTID ANGIOGRAM;  Surgeon: Lorretta Harp, MD;  Location: [facility] CATH LAB;  Service: Cardiovascular;  Laterality: N/A;  carotid angiogram and possible Lt SCA PTA  . COLONOSCOPY W/ POLYPECTOMY  12/2013  . CORONARY ANGIOPLASTY    . ENDARTERECTOMY Left 11/08/2014   Procedure: LEFT CAROTID ENDARTERECTOMY WITH HEMASHIELD PATCH ANGIOPLASTY;  Surgeon: Elam Dutch, MD;  Location: [place];  Service: Vascular;  Laterality: Left;  . ESOPHAGOGASTRODUODENOSCOPY N/A 01/20/2014   Procedure: ESOPHAGOGASTRODUODENOSCOPY (EGD);  Surgeon: Jerene Bears, MD;  Location: [facility] ENDOSCOPY;  Service: Endoscopy;  Laterality: N/A;  . FEMORAL-POPLITEAL BYPASS GRAFT Right 10/12/2013   Procedure:   Femoral-Peroneal trunk  bypass with nonreversed greater saphenous vein graft;  Surgeon: Elam Dutch, MD;  Location: [facility];  Service: Vascular;  Laterality: Right;  . GIVENS CAPSULE STUDY N/A 01/20/2014   Procedure: GIVENS CAPSULE STUDY;  Surgeon: Jerene Bears, MD;  Location: [place];  Service: Gastroenterology;  Laterality: N/A;  .  INTRAOPERATIVE ARTERIOGRAM Right 10/12/2013   Procedure: INTRA OPERATIVE ARTERIOGRAM;  Surgeon: Elam Dutch, MD;  Location: [place];  Service: Vascular;  Laterality: Right;  . ORIF ELBOW FRACTURE  08/16/2011   Procedure: OPEN REDUCTION INTERNAL FIXATION (ORIF) ELBOW/OLECRANON FRACTURE;  Surgeon: Schuyler Amor, MD;  Location: [place];  Service: Orthopedics;  Laterality: Left;  . RENAL ANGIOGRAM N/A 08/12/2013   Procedure: RENAL ANGIOGRAM;  Surgeon: Elam Dutch, MD;  Location: [facility] CATH LAB;  Service: Cardiovascular;  Laterality: N/A;  . SUBCLAVIAN STENT PLACEMENT Left 07/31/2011   7x18 [place], balloon, with reduction of 90% ostial left subclavian artery stenosis to 0% with residual excellent flow  . TONSILLECTOMY    . TUBAL LIGATION      OB History    No data available       Home Medications    Prior to Admission medications   Medication Sig Start Date End Date Taking? Authorizing Provider  ACCU-CHEK FASTCLIX LANCETS [place] USE   TO CHECK GLUCOSE ONCE DAILY 04/15/16  Yes Laurey Morale, MD  ACCU-CHEK SMARTVIEW test strip CHECK BLOOD SUGAR ONCE DAILY 08/09/15  Yes Laurey Morale, MD  albuterol (PROVENTIL HFA;VENTOLIN HFA) 108 (90 Base) MCG/ACT inhaler Inhale 2 puffs into the lungs every 4 (four) hours as needed for wheezing or shortness of breath. 10/12/15  Yes Laurey Morale, MD  allopurinol (ZYLOPRIM) 300 MG tablet Take 1 tablet (300 mg total) by mouth daily. 10/15/15  Yes Laurey Morale, MD  ALPRAZolam Duanne Moron) 1 MG tablet Take 1 tablet (1 mg total) by mouth every 6 (six) hours as needed for anxiety. 05/02/16  Yes Laurey Morale, MD  clopidogrel (PLAVIX) 75 MG tablet Take 1 tablet (75 mg total) by mouth daily. 06/27/15  Yes Laurey Morale, MD  DULoxetine (CYMBALTA) 60 MG capsule TAKE ONE CAPSULE BY MOUTH TWICE DAILY Patient taking differently: TAKE 60 MG BY MOUTH TWICE DAILY 02/04/16  Yes Laurey Morale, MD  fluticasone (CLARISPRAY) 50 MCG/ACT nasal spray Place 1 spray into both nostrils daily.    Yes Historical Provider, MD  fluticasone (FLONASE) 50 MCG/ACT nasal spray Place 1 spray into both nostrils daily as needed for allergies or rhinitis.   Yes Historical Provider, MD  furosemide (LASIX) 80 MG tablet Take 80 mg by mouth 2 (two) times daily.   Yes Historical Provider, MD  HYDROcodone-acetaminophen (NORCO) 10-325 MG tablet Take 1 tablet by mouth every 6 (six) hours as needed for moderate pain. 05/02/16  Yes Laurey Morale, MD  metoprolol succinate (TOPROL-XL) 25 MG 24 hr tablet Take 1 tablet (25 mg total) by mouth daily. 05/02/16  Yes Laurey Morale, MD  nitroGLYCERIN (NITROSTAT) 0.4 MG SL tablet Place 1 tablet (0.4 mg total) under the tongue every 5 (five) minutes x 3 doses as needed for chest pain. 10/12/15  Yes Laurey Morale, MD  furosemide (LASIX) 40 MG tablet Take 1 tablet (40 mg total) by mouth 2 (two) times daily. Patient not taking: Reported on 05/14/2016 05/02/16   Laurey Morale, MD  metoprolol tartrate (LOPRESSOR) 25 MG tablet Take 25 mg by mouth daily.    Historical Provider, MD    Family History Family History  Problem Relation Age of Onset  . CAD Father   . Heart disease Father   . Hyperlipidemia Father   . Heart disease Mother   . Deep vein thrombosis Son   . Hyperlipidemia    . Colon cancer Maternal Grandmother   . Cancer Sister     ovarian  . Diabetes Sister   . Heart disease Sister   . Anesthesia problems Neg Hx   . Hypotension Neg Hx   . Malignant hyperthermia Neg Hx   . Pseudochol deficiency Neg Hx     Social History Social History  Substance Use Topics  . Smoking status: Current Every Day Smoker    Packs/day: 1.00    Years: 62.00    Types: Cigarettes  . Smokeless tobacco: Never Used     Comment: Down to 3-4 cigarettes per day  . Alcohol use No     Allergies   Potassium-containing compounds; Azithromycin; Codeine; Darvon; Erythromycin; Meloxicam; Norco [hydrocodone-acetaminophen]; Penicillins; Propoxyphene n-acetaminophen; Rofecoxib;  Rosuvastatin; Statins; and Sulfa antibiotics   Review of Systems Review of Systems  ROS 10 Systems reviewed and are negative for acute change except as noted in the HPI.     Physical Exam Updated Vital Signs BP 142/91   Pulse 99   Temp 97.9 F (36.6 C) (Oral)   Resp 19   Ht 5\' 8"  (1.727  m)   Wt 175 lb (79.4 kg)   SpO2 100%   BMI 26.61 kg/m   Physical Exam  Constitutional: She appears well-developed and well-nourished.  HENT:  Head: Normocephalic and atraumatic.  Eyes: Pupils are equal, round, and reactive to light.  Pupils are 64mm and equal  Neck: Neck supple.  Cardiovascular: Normal rate and regular rhythm.   Murmur heard. Pulmonary/Chest: Effort normal. No respiratory distress.  Abdominal: Soft. She exhibits no distension. There is no tenderness.  Neurological:  somnolent  Skin: Skin is warm and dry.  Nursing note and vitals reviewed.    ED Treatments / Results  Labs (all labs ordered are listed, but only abnormal results are displayed) Labs Reviewed  BASIC METABOLIC PANEL - Abnormal; Notable for the following:       Result Value   Potassium 2.6 (*)    Chloride 95 (*)    Glucose, Bld 174 (*)    BUN 24 (*)    Creatinine, Ser 1.64 (*)    Calcium 8.6 (*)    GFR calc non Af Amer 29 (*)    GFR calc Af Amer 33 (*)    All other components within normal limits  CBC - Abnormal; Notable for the following:    WBC 18.1 (*)    All other components within normal limits  URINALYSIS, ROUTINE W REFLEX MICROSCOPIC (NOT AT [facility]) - Abnormal; Notable for the following:    Protein, ur 30 (*)    All other components within normal limits  RAPID URINE DRUG SCREEN, HOSP PERFORMED - Abnormal; Notable for the following:    Opiates POSITIVE (*)    Benzodiazepines POSITIVE (*)    All other components within normal limits  TROPONIN I - Abnormal; Notable for the following:    Troponin I 11.34 (*)    All other components within normal limits  CK - Abnormal; Notable for the  following:    Total CK 1,073 (*)    All other components within normal limits  URINE MICROSCOPIC-ADD ON - Abnormal; Notable for the following:    Squamous Epithelial / LPF 0-5 (*)    Bacteria, UA MANY (*)    Casts HYALINE CASTS (*)    All other components within normal limits  MAGNESIUM  APTT    EKG  EKG Interpretation  Date/Time:  Wednesday May 14 2016 08:52:42 EST Ventricular Rate:  103 PR Interval:    QRS Duration: 134 QT Interval:  394 QTC Calculation: 516 R Axis:   -34 Text Interpretation:  Atrial-sensed ventricular-paced complexes No further analysis attempted due to paced rhythm No acute changes Confirmed by Kathrynn Humble, MD, Thelma Comp [phone]) on 05/14/2016 9:07:39 AM       Radiology Dg Chest 2 View  Result Date: 05/14/2016 CLINICAL DATA:  Recent falls, found on floor today, possibly hitting head, recent cough EXAM: CHEST  2 VIEW COMPARISON:  Chest x-ray of 04/09/2016 FINDINGS: There is little change in cardiomegaly, and there does appear to be mild pulmonary vascular congestion present. No focal infiltrate or effusion is seen. Pacer with AICD lead remains. The bones are osteopenic. IMPRESSION: Stable cardiomegaly.  Question mild pulmonary vascular congestion Electronically Signed   By: Ivar Drape M.D.   On: 05/14/2016 08:55   Dg Ribs Unilateral W/chest Left  Result Date: 05/14/2016 CLINICAL DATA:  Left-sided chest pain following recent fall, initial encounter EXAM: LEFT RIBS AND CHEST - 3+ VIEW COMPARISON:  05/14/2016 FINDINGS: Cardiac shadow is again enlarged. A defibrillator is again seen. Poor inspiratory effort  is noted with crowding of the vascular markings. Calcified granuloma is noted in the left lung. No acute rib fracture or pneumothorax is noted. IMPRESSION: No evidence of acute rib fracture. Electronically Signed   By: Inez Catalina M.D.   On: 05/14/2016 14:06   Ct Head Wo Contrast  Result Date: 05/14/2016 CLINICAL DATA:  Per EMS pt is from home. Son found her  sitting on the floor next to her bed. Down time unknown. Pt has Hx of dementia and COPD EXAM: CT HEAD WITHOUT CONTRAST CT CERVICAL SPINE WITHOUT CONTRAST TECHNIQUE: Multidetector CT imaging of the head and cervical spine was performed following the standard protocol without intravenous contrast. Multiplanar CT image reconstructions of the cervical spine were also generated. COMPARISON:  02/26/2016 and previous FINDINGS: CT HEAD FINDINGS Brain: Diffuse parenchymal atrophy. Patchy areas of hypoattenuation in deep and periventricular white matter bilaterally. Negative for acute intracranial hemorrhage, mass lesion, acute infarction, midline shift, or mass-effect. Acute infarct may be inapparent on noncontrast CT. Ventricles and sulci symmetric. Vascular: Atherosclerotic and physiologic intracranial calcifications. Skull:  Bone windows demonstrate no focal lesion. Sinuses/Orbits: No acute finding. Other: None. CT CERVICAL SPINE FINDINGS Alignment: 2-3 mm anterolisthesis C4-5 and C5-6 probably secondary to asymmetric facet DJD. Skull base and vertebrae: No acute fracture. No primary bone lesion or focal pathologic process. Soft tissues and spinal canal: No prevertebral fluid or swelling. No visible canal hematoma. Disc levels: Central protrusion C2-3. Mild narrowing C5-6 with right posterolateral and foraminal partially calcified protrusion resulting in foraminal stenosis. Of uncovertebral facets side DJD results in foraminal encroachment bilaterally C3-4, left C4-5, right C5-6, C6-7, and C7-T1. Upper chest: Transvenous pacing leads are partially visualized. Visualized lung apices unremarkable. Bilateral carotid calcified plaque. Other: None IMPRESSION: 1. Negative for bleed or other acute intracranial process. 2. Atrophy and nonspecific white matter changes. 3. Negative for cervical fracture or other acute bone abnormality. 4. Multilevel degenerative changes as enumerated above. Electronically Signed   By: Lucrezia Europe  M.D.   On: 05/14/2016 08:46   Ct Cervical Spine Wo Contrast  Result Date: 05/14/2016 CLINICAL DATA:  Per EMS pt is from home. Son found her sitting on the floor next to her bed. Down time unknown. Pt has Hx of dementia and COPD EXAM: CT HEAD WITHOUT CONTRAST CT CERVICAL SPINE WITHOUT CONTRAST TECHNIQUE: Multidetector CT imaging of the head and cervical spine was performed following the standard protocol without intravenous contrast. Multiplanar CT image reconstructions of the cervical spine were also generated. COMPARISON:  02/26/2016 and previous FINDINGS: CT HEAD FINDINGS Brain: Diffuse parenchymal atrophy. Patchy areas of hypoattenuation in deep and periventricular white matter bilaterally. Negative for acute intracranial hemorrhage, mass lesion, acute infarction, midline shift, or mass-effect. Acute infarct may be inapparent on noncontrast CT. Ventricles and sulci symmetric. Vascular: Atherosclerotic and physiologic intracranial calcifications. Skull:  Bone windows demonstrate no focal lesion. Sinuses/Orbits: No acute finding. Other: None. CT CERVICAL SPINE FINDINGS Alignment: 2-3 mm anterolisthesis C4-5 and C5-6 probably secondary to asymmetric facet DJD. Skull base and vertebrae: No acute fracture. No primary bone lesion or focal pathologic process. Soft tissues and spinal canal: No prevertebral fluid or swelling. No visible canal hematoma. Disc levels: Central protrusion C2-3. Mild narrowing C5-6 with right posterolateral and foraminal partially calcified protrusion resulting in foraminal stenosis. Of uncovertebral facets side DJD results in foraminal encroachment bilaterally C3-4, left C4-5, right C5-6, C6-7, and C7-T1. Upper chest: Transvenous pacing leads are partially visualized. Visualized lung apices unremarkable. Bilateral carotid calcified plaque. Other: None IMPRESSION: 1.  Negative for bleed or other acute intracranial process. 2. Atrophy and nonspecific white matter changes. 3. Negative for  cervical fracture or other acute bone abnormality. 4. Multilevel degenerative changes as enumerated above. Electronically Signed   By: Lucrezia Europe M.D.   On: 05/14/2016 08:46    Procedures .Critical Care Performed by: Varney Biles Authorized by: Varney Biles   Critical care provider statement:    Critical care time (minutes):  80   Critical care time was exclusive of:  Separately billable procedures and treating other patients   Critical care was necessary to treat or prevent imminent or life-threatening deterioration of the following conditions:  Circulatory failure and CNS failure or compromise   Critical care was time spent personally by me on the following activities:  Blood draw for specimens, development of treatment plan with patient or surrogate, discussions with consultants, examination of patient, interpretation of cardiac output measurements, obtaining history from patient or surrogate, ordering and performing treatments and interventions, ordering and review of laboratory studies, ordering and review of radiographic studies, pulse oximetry and re-evaluation of patient's condition   (including critical care time)  Medications Ordered in ED Medications  aspirin chewable tablet 324 mg (324 mg Oral Refused 05/14/16 1409)  heparin bolus via infusion 4,000 Units (not administered)  heparin ADULT infusion 100 units/mL (25000 units/222mL sodium chloride 0.45%) (not administered)  naloxone (NARCAN) injection 0.4 mg (0.4 mg Intravenous Given 05/14/16 1226)  sodium chloride 0.9 % 1,000 mL with potassium chloride 80 mEq infusion ( Intravenous Given 05/14/16 1226)     Initial Impression / Assessment and Plan / ED Course  I have reviewed the triage vital signs and the nursing notes.  Pertinent labs & imaging results that were available during my care of the patient were reviewed by me and considered in my medical decision making (see chart for details).  Clinical Course as of May 14 1434  Wed May 14, 2016  1201 Pt reassessed. Still somnolent. Results from the ER workup discussed with the patient's son face to face and all questions answered to the best of my ability.  He doesn't feel comfortable taking his mother home in this state, and I agree. Pt has gotten slightly better only.  Spoke with hospitalist. They will assess the patient. Narcan ordered per their request.  [AN]  1431 4 hours post initial assessment, pt continues to be somnolent. Workup neg, imaging neg. CK added. Trops added - both elevated. Troponin is > 11 - likely NSTEMI. Repeat EKg is unremarkable. Pt still denies chest pain. Spoke with Dr. Dionicia Abler - Cardiology. He recommends transfer to [facility] with Cards  on consult. Medicine to admit. Son called at  [phone]. Mr. Christia Reading, who has POA and lives with mother, reports that pt has been having exertional dyspnea for last several months but there was no mention of chest pain. He is aware of the elevated trops and plan for Cards eval.  [AN]    Clinical Course User Index [AN] Varney Biles, MD    Pt comes in with cc of fall. She is noted to be somnolent. Pt has multiple medical comorbidities- copd/chf/dm. Also on pain meds. DDx includes: ICH Stroke ACS Sepsis syndrome Infection - UTI/Pneumonia Electrolyte abnormality Drug overdose DKA Metabolic disorders including thyroid disorders, adrenal insufficiency Hypercapnia  Based on the hx - appropriate imaging ordered to r/o fractures/bleeds. CXR to ensure no large rib fractures are seen. CT head and Cspine ordered.  Initial impression is that somnolence is due to medications.  Final Clinical Impressions(s) / ED Diagnoses   Final diagnoses:  Hypokalemia  Fall in home, initial encounter  Encephalopathy  NSTEMI (non-ST elevated myocardial infarction) [facility])  Non-traumatic rhabdomyolysis    New Prescriptions New Prescriptions   No medications on file     Varney Biles, MD 05/14/16  Kennedy, MD 05/14/16 1435

## 2016-05-15 ENCOUNTER — Inpatient Hospital Stay (HOSPITAL_COMMUNITY): Payer: Medicare Other

## 2016-05-15 DIAGNOSIS — I2510 Atherosclerotic heart disease of native coronary artery without angina pectoris: Secondary | ICD-10-CM

## 2016-05-15 DIAGNOSIS — Z9861 Coronary angioplasty status: Secondary | ICD-10-CM

## 2016-05-15 DIAGNOSIS — J80 Acute respiratory distress syndrome: Secondary | ICD-10-CM

## 2016-05-15 LAB — HEMOGLOBIN A1C
HEMOGLOBIN A1C: 6.9 % — AB (ref 4.8–5.6)
Hgb A1c MFr Bld: 6.9 % — ABNORMAL HIGH (ref 4.8–5.6)
MEAN PLASMA GLUCOSE: 151 mg/dL
Mean Plasma Glucose: 151 mg/dL

## 2016-05-15 LAB — LIPID PANEL
CHOLESTEROL: 193 mg/dL (ref 0–200)
HDL: 25 mg/dL — ABNORMAL LOW (ref >40)
LDL Cholesterol: 145 mg/dL — ABNORMAL HIGH (ref 0–99)
TRIGLYCERIDES: 116 mg/dL (ref <150)
Total CHOL/HDL Ratio: 7.7 RATIO
VLDL: 23 mg/dL (ref 0–40)

## 2016-05-15 LAB — BLOOD GAS, ARTERIAL
ACID-BASE EXCESS: 2.8 mmol/L — AB (ref 0.0–2.0)
Bicarbonate: 26.9 mmol/L (ref 20.0–28.0)
DRAWN BY: 301361
O2 CONTENT: 4 L/min
O2 SAT: 90.9 %
PATIENT TEMPERATURE: 98.6
PH ART: 7.424 (ref 7.350–7.450)
pCO2 arterial: 41.9 mmHg (ref 32.0–48.0)
pO2, Arterial: 62.5 mmHg — ABNORMAL LOW (ref 83.0–108.0)

## 2016-05-15 LAB — HEPARIN LEVEL (UNFRACTIONATED)
HEPARIN UNFRACTIONATED: 0.29 [IU]/mL — AB (ref 0.30–0.70)
Heparin Unfractionated: 0.1 IU/mL — ABNORMAL LOW (ref 0.30–0.70)
Heparin Unfractionated: 0.3 IU/mL (ref 0.30–0.70)

## 2016-05-15 LAB — GLUCOSE, CAPILLARY
GLUCOSE-CAPILLARY: 183 mg/dL — AB (ref 65–99)
GLUCOSE-CAPILLARY: 187 mg/dL — AB (ref 65–99)
Glucose-Capillary: 173 mg/dL — ABNORMAL HIGH (ref 65–99)
Glucose-Capillary: 276 mg/dL — ABNORMAL HIGH (ref 65–99)
Glucose-Capillary: 172 mg/dL — ABNORMAL HIGH (ref 65–99)
Glucose-Capillary: 184 mg/dL — ABNORMAL HIGH (ref 65–99)
Glucose-Capillary: 227 mg/dL — ABNORMAL HIGH (ref 65–99)

## 2016-05-15 LAB — MAGNESIUM
MAGNESIUM: 2.3 mg/dL (ref 1.7–2.4)
Magnesium: 1.7 mg/dL (ref 1.7–2.4)

## 2016-05-15 LAB — ECHOCARDIOGRAM LIMITED
Height: 63 in
WEIGHTICAEL: 2934.76 [oz_av]

## 2016-05-15 LAB — BASIC METABOLIC PANEL
Anion gap: 11 (ref 5–15)
BUN: 20 mg/dL (ref 6–20)
CALCIUM: 8.6 mg/dL — AB (ref 8.9–10.3)
CO2: 26 mmol/L (ref 22–32)
Chloride: 100 mmol/L — ABNORMAL LOW (ref 101–111)
Creatinine, Ser: 1.34 mg/dL — ABNORMAL HIGH (ref 0.44–1.00)
GFR calc Af Amer: 43 mL/min — ABNORMAL LOW (ref >60)
GFR, EST NON AFRICAN AMERICAN: 37 mL/min — AB (ref >60)
GLUCOSE: 172 mg/dL — AB (ref 65–99)
Potassium: 3.3 mmol/L — ABNORMAL LOW (ref 3.5–5.1)
Sodium: 137 mmol/L (ref 135–145)

## 2016-05-15 LAB — TROPONIN I
TROPONIN I: 8.24 ng/mL — AB (ref <0.03)
TROPONIN I: 7.76 ng/mL — AB (ref <0.03)

## 2016-05-15 LAB — CK
CK TOTAL: 734 U/L — AB (ref 38–234)
CK TOTAL: 578 U/L — AB (ref 38–234)
Total CK: 297 U/L — ABNORMAL HIGH (ref 38–234)

## 2016-05-15 LAB — LACTIC ACID, PLASMA: Lactic Acid, Venous: 1.6 mmol/L (ref 0.5–1.9)

## 2016-05-15 LAB — CBC WITH DIFFERENTIAL/PLATELET
BASOS ABS: 0 10*3/uL (ref 0.0–0.1)
BASOS PCT: 0 %
Eosinophils Absolute: 0 10*3/uL (ref 0.0–0.7)
Eosinophils Relative: 0 %
HEMATOCRIT: 36.6 % (ref 36.0–46.0)
HEMOGLOBIN: 11.6 g/dL — AB (ref 12.0–15.0)
LYMPHS PCT: 6 %
Lymphs Abs: 1.3 10*3/uL (ref 0.7–4.0)
MCH: 30.3 pg (ref 26.0–34.0)
MCHC: 31.7 g/dL (ref 30.0–36.0)
MCV: 95.6 fL (ref 78.0–100.0)
MONO ABS: 1.6 10*3/uL — AB (ref 0.1–1.0)
Monocytes Relative: 8 %
NEUTROS ABS: 17.9 10*3/uL — AB (ref 1.7–7.7)
NEUTROS PCT: 86 %
Platelets: 293 10*3/uL (ref 150–400)
RBC: 3.83 MIL/uL — AB (ref 3.87–5.11)
RDW: 14.9 % (ref 11.5–15.5)
WBC: 20.8 10*3/uL — AB (ref 4.0–10.5)

## 2016-05-15 LAB — STREP PNEUMONIAE URINARY ANTIGEN: STREP PNEUMO URINARY ANTIGEN: NEGATIVE

## 2016-05-15 LAB — MRSA PCR SCREENING: MRSA by PCR: NEGATIVE

## 2016-05-15 LAB — POTASSIUM: POTASSIUM: 3.9 mmol/L (ref 3.5–5.1)

## 2016-05-15 MED ORDER — SODIUM CHLORIDE 0.9 % IV SOLN
30.0000 meq | Freq: Two times a day (BID) | INTRAVENOUS | Status: AC
  Administered 2016-05-15 (×2): 30 meq via INTRAVENOUS
  Filled 2016-05-15 (×3): qty 15

## 2016-05-15 MED ORDER — MAGNESIUM SULFATE 2 GM/50ML IV SOLN
2.0000 g | Freq: Once | INTRAVENOUS | Status: AC
  Administered 2016-05-15: 2 g via INTRAVENOUS
  Filled 2016-05-15: qty 50

## 2016-05-15 MED ORDER — HYDROCODONE-ACETAMINOPHEN 10-325 MG PO TABS
1.0000 | ORAL_TABLET | Freq: Three times a day (TID) | ORAL | Status: DC | PRN
  Administered 2016-05-17 – 2016-05-21 (×4): 1 via ORAL
  Filled 2016-05-15 (×5): qty 1

## 2016-05-15 MED ORDER — INSULIN ASPART 100 UNIT/ML ~~LOC~~ SOLN
0.0000 [IU] | SUBCUTANEOUS | Status: DC
  Administered 2016-05-15: 3 [IU] via SUBCUTANEOUS
  Administered 2016-05-15 (×3): 2 [IU] via SUBCUTANEOUS
  Administered 2016-05-15: 5 [IU] via SUBCUTANEOUS
  Administered 2016-05-15 – 2016-05-16 (×2): 2 [IU] via SUBCUTANEOUS
  Administered 2016-05-16: 3 [IU] via SUBCUTANEOUS
  Administered 2016-05-16: 2 [IU] via SUBCUTANEOUS

## 2016-05-15 MED ORDER — MORPHINE SULFATE (PF) 2 MG/ML IV SOLN
INTRAVENOUS | Status: AC
  Administered 2016-05-15: 2 mg via INTRAVENOUS
  Filled 2016-05-15: qty 1

## 2016-05-15 MED ORDER — DM-GUAIFENESIN ER 30-600 MG PO TB12
1.0000 | ORAL_TABLET | Freq: Two times a day (BID) | ORAL | Status: DC
  Administered 2016-05-15 – 2016-05-23 (×16): 1 via ORAL
  Filled 2016-05-15 (×16): qty 1

## 2016-05-15 MED ORDER — HEPARIN BOLUS VIA INFUSION
2500.0000 [IU] | Freq: Once | INTRAVENOUS | Status: AC
  Administered 2016-05-15: 2500 [IU] via INTRAVENOUS
  Filled 2016-05-15: qty 2500

## 2016-05-15 MED ORDER — BISOPROLOL FUMARATE 5 MG PO TABS
10.0000 mg | ORAL_TABLET | Freq: Every day | ORAL | Status: DC
  Administered 2016-05-15 – 2016-05-18 (×4): 10 mg via ORAL
  Filled 2016-05-15 (×5): qty 2

## 2016-05-15 MED ORDER — FUROSEMIDE 10 MG/ML IJ SOLN
60.0000 mg | Freq: Once | INTRAMUSCULAR | Status: AC
  Administered 2016-05-15: 60 mg via INTRAVENOUS
  Filled 2016-05-15: qty 6

## 2016-05-15 MED ORDER — FUROSEMIDE 10 MG/ML IJ SOLN
40.0000 mg | Freq: Once | INTRAMUSCULAR | Status: AC
  Administered 2016-05-15: 40 mg via INTRAVENOUS
  Filled 2016-05-15: qty 4

## 2016-05-15 MED ORDER — ALPRAZOLAM 0.25 MG PO TABS
0.2500 mg | ORAL_TABLET | Freq: Four times a day (QID) | ORAL | Status: DC | PRN
  Administered 2016-05-16 – 2016-05-19 (×6): 0.25 mg via ORAL
  Filled 2016-05-15 (×6): qty 1

## 2016-05-15 MED ORDER — LEVOFLOXACIN IN D5W 750 MG/150ML IV SOLN
750.0000 mg | INTRAVENOUS | Status: DC
  Administered 2016-05-15 – 2016-05-17 (×2): 750 mg via INTRAVENOUS
  Filled 2016-05-15 (×3): qty 150

## 2016-05-15 MED ORDER — MORPHINE SULFATE (PF) 2 MG/ML IV SOLN
2.0000 mg | Freq: Once | INTRAVENOUS | Status: AC
  Administered 2016-05-15: 2 mg via INTRAVENOUS

## 2016-05-15 MED ORDER — METHYLPREDNISOLONE SODIUM SUCC 125 MG IJ SOLR
60.0000 mg | Freq: Every day | INTRAMUSCULAR | Status: DC
  Administered 2016-05-15: 60 mg via INTRAVENOUS
  Filled 2016-05-15: qty 2

## 2016-05-15 NOTE — Progress Notes (Signed)
ANTICOAGULATION CONSULT NOTE - Follow-up Consult  Pharmacy Consult for Heparin Indication: chest pain/ACS  Allergies  Allergen Reactions  . Potassium-Containing Compounds Other (See Comments)    Causes severe constipation  . Azithromycin Rash  . Codeine Itching  . Darvon Itching  . Erythromycin Rash  . Meloxicam Other (See Comments)    Unknown    . Norco [Hydrocodone-Acetaminophen] Itching  . Penicillins Rash    Has patient had a PCN reaction causing immediate rash, facial/tongue/throat swelling, SOB or lightheadedness with hypotension: unknown Has patient had a PCN reaction causing severe rash involving mucus membranes or skin necrosis: no Has patient had a PCN reaction that required hospitalization: unknown Has patient had a PCN reaction occurring within the last 10 years: no If all of the above answers are "NO", then may proceed with Cephalosporin use.   Marland Kitchen Propoxyphene N-Acetaminophen Itching  . Rofecoxib Other (See Comments)    Unknown    . Rosuvastatin Other (See Comments)    cramps  . Statins Itching and Other (See Comments)    Sleeplessness  . Sulfa Antibiotics Rash    Patient Measurements: Height: 5\' 3"  (160 cm) Weight: 174 lb 13.2 oz (79.3 kg) IBW/kg (Calculated) : 52.4 Heparin Dosing Weight: use actual weight  Vital Signs: Temp: 98.7 F (37.1 C) (11/29 2133) Temp Source: Oral (11/29 2133) BP: 137/62 (11/29 2155) Pulse Rate: 107 (11/29 2155)  Labs:  Recent Labs  05/14/16 0909 05/14/16 1208 05/14/16 1514 05/14/16 2134 05/14/16 2247  HGB 12.8  --   --   --   --   HCT 40.3  --   --   --   --   PLT 342  --   --   --   --   APTT  --   --  39*  --   --   HEPARINUNFRC  --   --   --   --  0.10*  CREATININE 1.64*  --   --   --   --   CKTOTAL  --  1,073*  --   --   --   TROPONINI  --  11.34*  --  9.73*  --     Estimated Creatinine Clearance: 28.2 mL/min (by C-G formula based on SCr of 1.64 mg/dL (H)).  Assessment: 72 yoF referred to ED with  productive cough.  She has a cardiac hx & was on Plavix PTA.  Starting heparin for elevated troponins. Heparin level subtherapeutic (0.1) on gtt at 1000 units/hr. No issues with line or bleeding reported per RN.  Goal of Therapy:  Heparin level 0.3-0.7 units/ml Monitor platelets by anticoagulation protocol: Yes   Plan:  Rebolus heparin 2500 units Increase heparin gtt to 1250 units/hr Will f/u 8 hr heparin level  Sherlon Handing, PharmD, BCPS Clinical pharmacist, pager [phone] 05/15/2016,12:24 AM

## 2016-05-15 NOTE — Progress Notes (Signed)
  Echocardiogram 2D Echocardiogram limited has been performed.  Tresa Res 05/15/2016, 11:46 AM

## 2016-05-15 NOTE — Progress Notes (Signed)
RT gave normal ABG results to Dr Sherral Hammers. He wants to hold off on placing pt on bi-pap at this time. RT will continue to monitor.

## 2016-05-15 NOTE — Progress Notes (Signed)
ANTICOAGULATION CONSULT NOTE - Follow-up Consult  Pharmacy Consult for Heparin Indication: chest pain/ACS  Allergies  Allergen Reactions  . Potassium-Containing Compounds Other (See Comments)    Causes severe constipation  . Azithromycin Rash  . Codeine Itching  . Darvon Itching  . Erythromycin Rash  . Meloxicam Other (See Comments)    Unknown    . Norco [Hydrocodone-Acetaminophen] Itching  . Penicillins Rash    Has patient had a PCN reaction causing immediate rash, facial/tongue/throat swelling, SOB or lightheadedness with hypotension: unknown Has patient had a PCN reaction causing severe rash involving mucus membranes or skin necrosis: no Has patient had a PCN reaction that required hospitalization: unknown Has patient had a PCN reaction occurring within the last 10 years: no If all of the above answers are "NO", then may proceed with Cephalosporin use.   Marland Kitchen Propoxyphene N-Acetaminophen Itching  . Rofecoxib Other (See Comments)    Unknown    . Rosuvastatin Other (See Comments)    cramps  . Statins Itching and Other (See Comments)    Sleeplessness  . Sulfa Antibiotics Rash    Patient Measurements: Height: 5\' 3"  (160 cm) Weight: 183 lb 6.8 oz (83.2 kg) IBW/kg (Calculated) : 52.4 Heparin Dosing Weight: use actual weight  Vital Signs: Temp: 97.5 F (36.4 C) (11/30 1700) BP: 109/65 (11/30 1600) Pulse Rate: 78 (11/30 1600)  Labs:  Recent Labs  05/14/16 0909  05/14/16 1514 05/14/16 2134 05/14/16 2247 05/15/16 0248 05/15/16 0926 05/15/16 1117 05/15/16 1927  HGB 12.8  --   --   --   --   --   --  11.6*  --   HCT 40.3  --   --   --   --   --   --  36.6  --   PLT 342  --   --   --   --   --   --  293  --   APTT  --   --  39*  --   --   --   --   --   --   HEPARINUNFRC  --   --   --   --  0.10*  --  0.30  --  0.29*  CREATININE 1.64*  --   --   --   --  1.34*  --   --   --   CKTOTAL  --   < >  --   --   --  734* 578*  --  297*  TROPONINI  --   < >  --  9.73*  --   8.24* 7.76*  --   --   < > = values in this interval not displayed.  Estimated Creatinine Clearance: 35.3 mL/min (by C-G formula based on SCr of 1.34 mg/dL (H)).  Assessment: 51 yoF with complex PMH. Patient had elevated troponins and found to have NSTEMI. Initial heparin level therapeutic, now slightly below goal. HgB low but stable with no reported bleeding.   Heparin level: 0.29  Goal of Therapy:  Heparin level 0.3-0.7 units/ml Monitor platelets by anticoagulation protocol: Yes   Plan:  Increase heparin to 1400 units/hr  Daily heparin level and CBC Monitor for signs and symptoms of bleeding.   Georga Bora, PharmD Clinical Pharmacist Pager: [phone] 05/15/2016 8:31 PM

## 2016-05-15 NOTE — Consult Note (Signed)
PULMONARY / CRITICAL CARE MEDICINE   Name: Sydney Phillips MRN: [id number] DOB: [birth year]    ADMISSION DATE:  05/14/2016 CONSULTATION DATE:  05/15/16   REFERRING MD:  Dr. Sherral Hammers / [facility]   CHIEF COMPLAINT:  Respiratory Failure   HISTORY OF PRESENT ILLNESS:   80 y/o F, current smoker, with PMH of arthritis, chronic back pain, depression with anxiety, fibromyalgia, CKD III, CAD s/p angioplasty, PVD s/p fem-pop bypass, carotid artery disease s/p L CEA, L subclavian artery stenosis s/p stent 07/2011, CHF s/p AICD, HTN, HLD, LBBB, GERD and O2 dependent COPD who presented to [facility] on 11/29 accompanied by her son with reports of increased falls.    Family reported on admission that she had 3 falls the week prior to presentation.  She was found down on the floor at home (unknown downtime).  At baseline, family reports she is normally active.  ER examination found her to be somnolent but arousable.  Labs - K 2.6, Cl 95, glucose 174, BUN 24, Sr Cr 1.64, WBC 18.1, troponin 11.34, CK 1073, UDS positive for opiates + benzos.  She was admitted per [facility] for possible NSTEMI, AMS, chronic hypoxic respiratory failure & hypokalemia.  Cardiology was consulted for evaluation.  The patient was noted to remain lethargic.  Rapid response was called around MN on 11/30 and an ABG was assessed > 7.45 / 40 / 64.1 / 28.1.  Per notes at that time, she had increased wheezing on exam.  Medications were adjusted from metoprolol to bisoprolol.  Am of 11/30 she was noted to have ongoing somnolence.  Morphine was administered by primary service for possible CHF exacerbation.    PCCM consulted for evaluation of respiratory failure.    PAST MEDICAL HISTORY :  She  has a past medical history of Arthritis; Automatic implantable cardioverter-defibrillator in situ; Bronchitis; CAD (coronary artery disease); Chronic back pain; Chronic constipation; Colon polyps (2003.  2015.); Congestive heart failure (CHF) ([place]); COPD (chronic obstructive  pulmonary disease) ([place]); Depression with anxiety; Diabetes mellitus without complication ([place]); Early cataracts, bilateral; Fibromyalgia; GERD (gastroesophageal reflux disease); Hemorrhoids; History of kidney stones; colonic polyps; Hyperlipemia; Hypertension; Insomnia; LBBB (left bundle branch block); Myocardial infarction; PAD (peripheral artery disease) ([place]); Presence of combination internal cardiac defibrillator (ICD) and pacemaker; Presence of permanent cardiac pacemaker; S/P angioplasty with stent, lt. subclavian 07/31/11 (08/01/2011); Shortness of breath; Subclavian arterial stenosis, lt, with PTA/STENT 07/31/11 (08/01/2011); Syncope (07/28/2011); Urinary incontinence; and Vertigo.  PAST SURGICAL HISTORY: She  has a past surgical history that includes Appendectomy; Tonsillectomy; Tubal ligation; Cardiac defibrillator placement (05/2008); Abdominal hysterectomy; Back surgery (2012); ORIF elbow fracture (08/16/2011); Cardiac catheterization (12/01/2007); Coronary angioplasty; Subclavian stent placement (Left, 07/31/2011); Femoral-popliteal Bypass Graft (Right, 10/12/2013); Intraoperative arteriogram (Right, 10/12/2013); Colonoscopy w/ polypectomy (12/2013); Esophagogastroduodenoscopy (N/A, 01/20/2014); Givens capsule study (N/A, 01/20/2014); carotid angiogram (N/A, 07/31/2011); Biv icd genertaor change out (Left, 02/20/2012); abdominal aortagram (N/A, 08/12/2013); renal angiogram (N/A, 08/12/2013); and Endarterectomy (Left, 11/08/2014).  Allergies  Allergen Reactions  . Potassium-Containing Compounds Other (See Comments)    Causes severe constipation  . Azithromycin Rash  . Codeine Itching  . Darvon Itching  . Erythromycin Rash  . Meloxicam Other (See Comments)    Unknown    . Norco [Hydrocodone-Acetaminophen] Itching  . Penicillins Rash    Has patient had a PCN reaction causing immediate rash, facial/tongue/throat swelling, SOB or lightheadedness with hypotension: unknown Has patient had a PCN reaction causing  severe rash involving mucus membranes or skin necrosis: no Has patient had a PCN reaction  that required hospitalization: unknown Has patient had a PCN reaction occurring within the last 10 years: no If all of the above answers are "NO", then may proceed with Cephalosporin use.   Marland Kitchen Propoxyphene N-Acetaminophen Itching  . Rofecoxib Other (See Comments)    Unknown    . Rosuvastatin Other (See Comments)    cramps  . Statins Itching and Other (See Comments)    Sleeplessness  . Sulfa Antibiotics Rash    No current facility-administered medications on file prior to encounter.    Current Outpatient Prescriptions on File Prior to Encounter  Medication Sig  . ACCU-CHEK FASTCLIX LANCETS MISC USE   TO CHECK GLUCOSE ONCE DAILY  . ACCU-CHEK SMARTVIEW test strip CHECK BLOOD SUGAR ONCE DAILY  . albuterol (PROVENTIL HFA;VENTOLIN HFA) 108 (90 Base) MCG/ACT inhaler Inhale 2 puffs into the lungs every 4 (four) hours as needed for wheezing or shortness of breath.  . allopurinol (ZYLOPRIM) 300 MG tablet Take 1 tablet (300 mg total) by mouth daily.  Marland Kitchen ALPRAZolam (XANAX) 1 MG tablet Take 1 tablet (1 mg total) by mouth every 6 (six) hours as needed for anxiety.  . clopidogrel (PLAVIX) 75 MG tablet Take 1 tablet (75 mg total) by mouth daily.  . DULoxetine (CYMBALTA) 60 MG capsule TAKE ONE CAPSULE BY MOUTH TWICE DAILY (Patient taking differently: TAKE 60 MG BY MOUTH TWICE DAILY)  . HYDROcodone-acetaminophen (NORCO) 10-325 MG tablet Take 1 tablet by mouth every 6 (six) hours as needed for moderate pain.  . metoprolol succinate (TOPROL-XL) 25 MG 24 hr tablet Take 1 tablet (25 mg total) by mouth daily.  . nitroGLYCERIN (NITROSTAT) 0.4 MG SL tablet Place 1 tablet (0.4 mg total) under the tongue every 5 (five) minutes x 3 doses as needed for chest pain.  . furosemide (LASIX) 40 MG tablet Take 1 tablet (40 mg total) by mouth 2 (two) times daily. (Patient not taking: Reported on 05/14/2016)    FAMILY HISTORY:  Her  @FAMSTP (<SUBSCRIPT> error)@  SOCIAL HISTORY: She  reports that she has been smoking Cigarettes.  She has a 62.00 pack-year smoking history. She has never used smokeless tobacco. She reports that she does not drink alcohol or use drugs.  REVIEW OF SYSTEMS:  Unable to complete as patient is lethergic.    SUBJECTIVE:   VITAL SIGNS: BP 131/75   Pulse 84   Temp 97.8 F (36.6 C) (Oral)   Resp (!) 40   Ht 5\' 3"  (1.6 m)   Wt 183 lb 6.8 oz (83.2 kg)   SpO2 96%   BMI 32.49 kg/m   HEMODYNAMICS:    VENTILATOR SETTINGS:    INTAKE / OUTPUT: I/O last 3 completed shifts: In: 167.6 [I.V.:167.6] Out: 16 [Urine:16]  PHYSICAL EXAMINATION: Gen. Elderly, well-nourished, in mild resp distress, lethargic ENT - no pallor,icterus, no post nasal drip Neck: No JVD, no thyromegaly, no carotid bruits Lungs: no use of accessory muscles, no dullness to percussion, coarse BS with BL rhonchi  Cardiovascular: Rhythm regular, heart sounds  normal, no murmurs, no peripheral edema Abdomen: soft and non-tender, no hepatosplenomegaly, BS normal. Musculoskeletal: No deformities, no cyanosis or clubbing Neuro:  lethargic, non focal, did not follow commands initially but 26minslater woke up & was Skin:  Warm, no lesions/ rash   LABS:  BMET  Recent Labs Lab 05/14/16 0909 05/15/16 0248  NA 135 137  K 2.6* 3.3*  CL 95* 100*  CO2 28 26  BUN 24* 20  CREATININE 1.64* 1.34*  GLUCOSE 174* 172*  Electrolytes  Recent Labs Lab 05/14/16 0909 05/14/16 1208 05/15/16 0248  CALCIUM 8.6*  --  8.6*  MG  --  1.9 1.7    CBC  Recent Labs Lab 05/14/16 0909  WBC 18.1*  HGB 12.8  HCT 40.3  PLT 342    Coag's  Recent Labs Lab 05/14/16 1514  APTT 39*    Sepsis Markers No results for input(s): LATICACIDVEN, PROCALCITON, O2SATVEN in the last 168 hours.  ABG  Recent Labs Lab 05/14/16 2202 05/15/16 0850  PHART 7.454* 7.424  PCO2ART 40.6 41.9  PO2ART 64.1* 62.5*    Liver Enzymes No  results for input(s): AST, ALT, ALKPHOS, BILITOT, ALBUMIN in the last 168 hours.  Cardiac Enzymes  Recent Labs Lab 05/14/16 1208 05/14/16 2134 05/15/16 0248  TROPONINI 11.34* 9.73* 8.24*    Glucose  Recent Labs Lab 05/15/16 0438 05/15/16 0723  GLUCAP 276* 183*    Imaging Dg Ribs Unilateral W/chest Left  Result Date: 05/14/2016 CLINICAL DATA:  Left-sided chest pain following recent fall, initial encounter EXAM: LEFT RIBS AND CHEST - 3+ VIEW COMPARISON:  05/14/2016 FINDINGS: Cardiac shadow is again enlarged. A defibrillator is again seen. Poor inspiratory effort is noted with crowding of the vascular markings. Calcified granuloma is noted in the left lung. No acute rib fracture or pneumothorax is noted. IMPRESSION: No evidence of acute rib fracture. Electronically Signed   By: Inez Catalina M.D.   On: 05/14/2016 14:06     STUDIES:  CT Head / Cervical Spine 11/29 >> neg for bleed or acute process, atrophy and non-specific white matter changes, negative for cervical fracture  CULTURES: BCx2 11/29 >>  UC 11/29 >>  RVP PCR 11/29 >>   ANTIBIOTICS: Doxy 11/29 >> 11/30 Levaquin 11/30 >>   SIGNIFICANT EVENTS: 11/29  Admit with AMS, hypoxic resp fx  LINES/TUBES:   DISCUSSION: 80 y/o F with PMH of significant coronary disease / PVD and O2 dependent COPD admitted with AMS, NSTEMI (trop > 11).  CT head negative.    ASSESSMENT / PLAN:  PULMONARY A: Acute Hypoxic Respiratory Failure  O2 Dependent COPD  P:   BiPAP prn Hold further sedating medications  Follow up ABG  Solumedrol 60 mg Q24 Duoneb Q6 + PRN xopenx Intermittent CXR  Doxycycline, D2/5   CARDIOVASCULAR A:  NSTEMI Peak troponin  8 CAD  PVD  Hx Angioplasty, Convoy Stenosis, L CEA  P:  Cardiology following    RENAL AKI  Hypokalemia  Hypomagnesemia  P:   Trend BMP / UOP  Replace electrolytes as indicated   GASTROINTESTINAL A:   Obesity  At Risk Aspiration in setting of AMS  P:   NPO  Diet ok  once mental status supports   HEMATOLOGIC A:   Leukocytosis - ? Stress response vs infectious etiology  P:  Trend CBC  SCD's for DVT prophylaxis   INFECTIOUS A:   AECOPD  P:   Change abx to levaquin given NPO status  ENDOCRINE A:   Hyperglycemia  - likely steroid induced  P:   CBG Q4 with SSI   NEUROLOGIC A:   Acute Encephalopathy - CT head negative  Hx Chronic pain, fibromyalgia, anxiety  P:   RASS goal: 0 Minimize narcotics Reduce home anti-anxiety meds    PCCM will follow  Kara Mead MD. FCCP. [facility] Pager [phone] If no response call [phone]   05/15/2016

## 2016-05-15 NOTE — Progress Notes (Signed)
Patient arrived via [facility] from [facility]. Patient previously had a breathing treatment prior to transport. Patient arrived with a dry congested cough and wheezing and crackles. Rapid response was called to bedside. Cardiology was called to assess patient and [place] was also asked to see the patient. Patient was on 2L O2 with sats of 92-93%. Patient was transported to 4N per MDs orders.

## 2016-05-15 NOTE — Progress Notes (Signed)
PROGRESS NOTE    Sydney Phillips  [id number] DOB: 1936/04/30 DOA: 05/14/2016 PCP: Laurey Morale, MD   Brief Narrative:  80 y.o. WF PMHx Depression with anxiety,Fibromyalgia, HTN, MI, LBBB, CAD, Chronic Systolic and Diastolic CHF s/p AICD, carotid artery disease s/p left CEA, left subclavian artery stenosis s/p stent, PAD s/p right fem-pop, COPD on 2 L O2 at home, OSA, hyperlipidemia. Chronic pain syndrome,Diabetes mellitus without complication   Patient presented to the ED after being found down by her son for an unknown amount of time. She has had about 3 falls recently thought to be secondary to medications. She is not sure of why she fell. She reports some associated right sided chest wall pain.    Subjective: 11/30  somnolent but arousable, constantly moaning, withdraws to pain.   Assessment & Plan:   Active Problems:   Essential hypertension   Coronary atherosclerosis   Hypokalemia   NSTEMI (non-ST elevated myocardial infarction) (HCC)   COPD exacerbation (HCC)   Chronic combined systolic and diastolic CHF (congestive heart failure) (HCC)  Altered mental status -Likely multifactorial to include COPD exacerbation, NSTEMI, overuse of Narcotic/Benzodiazepine. -monitor for improvement  Acute on Chronic hypoxic respiratory failure -DuoNeb QID -Albuterol PRN -Mucinex DM BID -Flutter valve -Physiotherapy vest QID -Titrate O2 to maintain SPO2 > 88% -Complete 5 day course antibiotics -Respiratory virus panel, sputum, urine, pending  NSTEMI -Cardiology plans on catheterization when patient more stable -Troponin of 11. EKG paced. No chest pain. Cardiology consulted and heparin started in ED. No chest pain. Was previously on an ACEi but was taken off secondary to hypotension. -continue heparin per pharmacy   Chronic Systolic and Diastolic CHF -S/P AICD -Cardiology consulted believes patient requires catheterization prior to discharge. However believes current crisis  not secondary to acute cardiac issue. Per cardiology last catheterization significant stenosis in LAD and RCA vessels. -Strict in and out -Daily weight -Followed by cardiology outpatient. Significant CAD history. History of EF of 20% in 2009. Last EF of 40-45% with diffuse hypokinesis and grade one diastolic dysfunction in [id number].  -daily weights -in/out -Lasix IV 60 mg x1, repeat Lasix IV 40 mg at 1500  CAD native artery -see CHF  Acute on CKD stage III(baseline Cr 1. Weight to 1.32) Lab Results  Component Value Date   CREATININE 1.34 (H) 05/15/2016   CREATININE 1.64 (H) 05/14/2016   CREATININE 1.92 (H) 04/16/2016    Hypokalemia -Potassium goal> 4  Hypomagnesemia -Magnesium goal> 2 -Magnesium IV 2 g -Recheck K/Mg at 1500    DVT prophylaxis: Heparin gtt Code Status: Full Family Communication: None Disposition Plan: ??   Consultants:  [facility]  Procedures/Significant Events:  Echocardiogram pending   VENTILATOR SETTINGS:     Cultures 11/30 sputum pending 11/30 blood pending 11/30 respiratory virus panel pending 11/30 urine pending 11/30 urine strep pneumo/Legionella pending   Antimicrobials: Levofloxacin 11/30>>   Devices    LINES / TUBES:      Continuous Infusions: . heparin 1,250 Units/hr (05/15/16 0600)     Objective: Vitals:   05/15/16 0500 05/15/16 0717 05/15/16 0759 05/15/16 0800  BP: 139/85 114/60  131/75  Pulse: 96 91  84  Resp: (!) 24 (!) 35  (!) 40  Temp:  97.8 F (36.6 C)    TempSrc:  Oral    SpO2: 91% 93% 94% 96%  Weight: 83.2 kg (183 lb 6.8 oz)     Height:        Intake/Output Summary (Last 24 hours) at 05/15/16 0946  Last data filed at 05/15/16 0600  Gross per 24 hour  Intake           167.58 ml  Output               16 ml  Net           151.58 ml   Filed Weights   05/14/16 0706 05/14/16 2133 05/15/16 0500  Weight: 79.4 kg (175 lb) 79.3 kg (174 lb 13.2 oz) 83.2 kg (183 lb 6.8 oz)     Examination:  General: Alert, moaning, follows some commands, acute on chronic respiratory distress Eyes: negative scleral hemorrhage, negative anisocoria, negative icterus ENT: Negative Runny nose, negative gingival bleeding, Neck:  Negative scars, masses, torticollis, lymphadenopathy, JVD Lungs: tachypnea, diffuse wheezing, negative crackles, negative rhonchi, diminished bibasilar breath sounds  Cardiovascular: Tachycardic Regular rhythm without murmur gallop or rub normal S1 and S2 Abdomen: Morbidly obese, negative abdominal pain, nondistended, positive soft, bowel sounds, no rebound, no ascites, no appreciable mass Extremities: No significant cyanosis, clubbing, or edema bilateral lower extremities Skin: Negative rashes, lesions, ulcers Psychiatric:  Unable to fully assess Central nervous system:  Moans, withdraws to pain, follow some commands  .     Data Reviewed: Care during the described time interval was provided by me .  I have reviewed this patient's available data, including medical history, events of note, physical examination, and all test results as part of my evaluation. I have personally reviewed and interpreted all radiology studies.  CBC:  Recent Labs Lab 05/14/16 0909  WBC 18.1*  HGB 12.8  HCT 40.3  MCV 96.4  PLT [id number]   Basic Metabolic Panel:  Recent Labs Lab 05/14/16 0909 05/14/16 1208 05/15/16 0248  NA 135  --  137  K 2.6*  --  3.3*  CL 95*  --  100*  CO2 28  --  26  GLUCOSE 174*  --  172*  BUN 24*  --  20  CREATININE 1.64*  --  1.34*  CALCIUM 8.6*  --  8.6*  MG  --  1.9 1.7   GFR: Estimated Creatinine Clearance: 35.3 mL/min (by C-G formula based on SCr of 1.34 mg/dL (H)). Liver Function Tests: No results for input(s): AST, ALT, ALKPHOS, BILITOT, PROT, ALBUMIN in the last 168 hours. No results for input(s): LIPASE, AMYLASE in the last 168 hours. No results for input(s): AMMONIA in the last 168 hours. Coagulation Profile: No results for  input(s): INR, PROTIME in the last 168 hours. Cardiac Enzymes:  Recent Labs Lab 05/14/16 1208 05/14/16 2134 05/15/16 0248  CKTOTAL 1,073*  --  734*  TROPONINI 11.34* 9.73* 8.24*   BNP (last 3 results) No results for input(s): PROBNP in the last 8760 hours. HbA1C: No results for input(s): HGBA1C in the last 72 hours. CBG:  Recent Labs Lab 05/15/16 0438 05/15/16 0723  GLUCAP 276* 183*   Lipid Profile:  Recent Labs  05/15/16 0248  CHOL 193  HDL 25*  LDLCALC 145*  TRIG 116  CHOLHDL 7.7   Thyroid Function Tests:  Recent Labs  05/14/16 2134  TSH 1.013   Anemia Panel: No results for input(s): VITAMINB12, FOLATE, FERRITIN, TIBC, IRON, RETICCTPCT in the last 72 hours. Urine analysis:    Component Value Date/Time   COLORURINE YELLOW 05/14/2016 [place] 05/14/2016 1208   LABSPEC 1.018 05/14/2016 1208   PHURINE 5.0 05/14/2016 1208   GLUCOSEU NEGATIVE 05/14/2016 1208   HGBUR NEGATIVE 05/14/2016 1208   HGBUR negative 01/01/2010 1353  BILIRUBINUR NEGATIVE 05/14/2016 1208   BILIRUBINUR n 05/30/2015 1542   KETONESUR NEGATIVE 05/14/2016 1208   PROTEINUR 30 (A) 05/14/2016 1208   UROBILINOGEN 0.2 05/30/2015 1542   UROBILINOGEN 1.0 11/03/2014 1442   NITRITE NEGATIVE 05/14/2016 1208   LEUKOCYTESUR NEGATIVE 05/14/2016 1208   Sepsis Labs: @LABRCNTIP (procalcitonin:4,lacticidven:4)  ) Recent Results (from the past 240 hour(s))  MRSA PCR Screening     Status: None   Collection Time: 05/15/16 12:34 AM  Result Value Ref Range Status   MRSA by PCR NEGATIVE NEGATIVE Final    Comment:        The GeneXpert MRSA Assay (FDA approved for NASAL specimens only), is one component of a comprehensive MRSA colonization surveillance program. It is not intended to diagnose MRSA infection nor to guide or monitor treatment for MRSA infections.          Radiology Studies: Dg Chest 2 View  Result Date: 05/14/2016 CLINICAL DATA:  Recent falls, found on  floor today, possibly hitting head, recent cough EXAM: CHEST  2 VIEW COMPARISON:  Chest x-ray of 04/09/2016 FINDINGS: There is little change in cardiomegaly, and there does appear to be mild pulmonary vascular congestion present. No focal infiltrate or effusion is seen. Pacer with AICD lead remains. The bones are osteopenic. IMPRESSION: Stable cardiomegaly.  Question mild pulmonary vascular congestion Electronically Signed   By: Ivar Drape M.D.   On: 05/14/2016 08:55   Dg Ribs Unilateral W/chest Left  Result Date: 05/14/2016 CLINICAL DATA:  Left-sided chest pain following recent fall, initial encounter EXAM: LEFT RIBS AND CHEST - 3+ VIEW COMPARISON:  05/14/2016 FINDINGS: Cardiac shadow is again enlarged. A defibrillator is again seen. Poor inspiratory effort is noted with crowding of the vascular markings. Calcified granuloma is noted in the left lung. No acute rib fracture or pneumothorax is noted. IMPRESSION: No evidence of acute rib fracture. Electronically Signed   By: Inez Catalina M.D.   On: 05/14/2016 14:06   Ct Head Wo Contrast  Result Date: 05/14/2016 CLINICAL DATA:  Per EMS pt is from home. Son found her sitting on the floor next to her bed. Down time unknown. Pt has Hx of dementia and COPD EXAM: CT HEAD WITHOUT CONTRAST CT CERVICAL SPINE WITHOUT CONTRAST TECHNIQUE: Multidetector CT imaging of the head and cervical spine was performed following the standard protocol without intravenous contrast. Multiplanar CT image reconstructions of the cervical spine were also generated. COMPARISON:  02/26/2016 and previous FINDINGS: CT HEAD FINDINGS Brain: Diffuse parenchymal atrophy. Patchy areas of hypoattenuation in deep and periventricular white matter bilaterally. Negative for acute intracranial hemorrhage, mass lesion, acute infarction, midline shift, or mass-effect. Acute infarct may be inapparent on noncontrast CT. Ventricles and sulci symmetric. Vascular: Atherosclerotic and physiologic intracranial  calcifications. Skull:  Bone windows demonstrate no focal lesion. Sinuses/Orbits: No acute finding. Other: None. CT CERVICAL SPINE FINDINGS Alignment: 2-3 mm anterolisthesis C4-5 and C5-6 probably secondary to asymmetric facet DJD. Skull base and vertebrae: No acute fracture. No primary bone lesion or focal pathologic process. Soft tissues and spinal canal: No prevertebral fluid or swelling. No visible canal hematoma. Disc levels: Central protrusion C2-3. Mild narrowing C5-6 with right posterolateral and foraminal partially calcified protrusion resulting in foraminal stenosis. Of uncovertebral facets side DJD results in foraminal encroachment bilaterally C3-4, left C4-5, right C5-6, C6-7, and C7-T1. Upper chest: Transvenous pacing leads are partially visualized. Visualized lung apices unremarkable. Bilateral carotid calcified plaque. Other: None IMPRESSION: 1. Negative for bleed or other acute intracranial process. 2. Atrophy and nonspecific  white matter changes. 3. Negative for cervical fracture or other acute bone abnormality. 4. Multilevel degenerative changes as enumerated above. Electronically Signed   By: Lucrezia Europe M.D.   On: 05/14/2016 08:46   Ct Cervical Spine Wo Contrast  Result Date: 05/14/2016 CLINICAL DATA:  Per EMS pt is from home. Son found her sitting on the floor next to her bed. Down time unknown. Pt has Hx of dementia and COPD EXAM: CT HEAD WITHOUT CONTRAST CT CERVICAL SPINE WITHOUT CONTRAST TECHNIQUE: Multidetector CT imaging of the head and cervical spine was performed following the standard protocol without intravenous contrast. Multiplanar CT image reconstructions of the cervical spine were also generated. COMPARISON:  02/26/2016 and previous FINDINGS: CT HEAD FINDINGS Brain: Diffuse parenchymal atrophy. Patchy areas of hypoattenuation in deep and periventricular white matter bilaterally. Negative for acute intracranial hemorrhage, mass lesion, acute infarction, midline shift, or  mass-effect. Acute infarct may be inapparent on noncontrast CT. Ventricles and sulci symmetric. Vascular: Atherosclerotic and physiologic intracranial calcifications. Skull:  Bone windows demonstrate no focal lesion. Sinuses/Orbits: No acute finding. Other: None. CT CERVICAL SPINE FINDINGS Alignment: 2-3 mm anterolisthesis C4-5 and C5-6 probably secondary to asymmetric facet DJD. Skull base and vertebrae: No acute fracture. No primary bone lesion or focal pathologic process. Soft tissues and spinal canal: No prevertebral fluid or swelling. No visible canal hematoma. Disc levels: Central protrusion C2-3. Mild narrowing C5-6 with right posterolateral and foraminal partially calcified protrusion resulting in foraminal stenosis. Of uncovertebral facets side DJD results in foraminal encroachment bilaterally C3-4, left C4-5, right C5-6, C6-7, and C7-T1. Upper chest: Transvenous pacing leads are partially visualized. Visualized lung apices unremarkable. Bilateral carotid calcified plaque. Other: None IMPRESSION: 1. Negative for bleed or other acute intracranial process. 2. Atrophy and nonspecific white matter changes. 3. Negative for cervical fracture or other acute bone abnormality. 4. Multilevel degenerative changes as enumerated above. Electronically Signed   By: Lucrezia Europe M.D.   On: 05/14/2016 08:46        Scheduled Meds: . allopurinol  300 mg Oral Daily  . aspirin  324 mg Oral Once  . bisoprolol  10 mg Oral Daily  . dextromethorphan-guaiFENesin  1 tablet Oral BID  . doxycycline  100 mg Oral Q12H  . DULoxetine  60 mg Oral BID  . insulin aspart  0-9 Units Subcutaneous Q4H  . ipratropium-albuterol  3 mL Nebulization Q6H  . lactated ringers  1,000 mL Intravenous Once  . levofloxacin (LEVAQUIN) IV  750 mg Intravenous Q24H  . methylPREDNISolone (SOLU-MEDROL) injection  60 mg Intravenous Q24H   Continuous Infusions: . heparin 1,250 Units/hr (05/15/16 0600)     LOS: 1 day    Time spent: 40  minutes    Kalaysia Demonbreun, Geraldo Docker, MD [facility] Pager [phone]   If 7PM-7AM, please contact night-coverage [email or web address] Password [facility] 05/15/2016, 9:46 AM

## 2016-05-15 NOTE — Progress Notes (Signed)
Communicated to Foley Team on [place] of need/order for foley to be placed on pt. Sydney Phillips an [place] to do today

## 2016-05-15 NOTE — Progress Notes (Signed)
ANTICOAGULATION CONSULT NOTE - Follow-up Consult  Pharmacy Consult for Heparin Indication: chest pain/ACS  Allergies  Allergen Reactions  . Potassium-Containing Compounds Other (See Comments)    Causes severe constipation  . Azithromycin Rash  . Codeine Itching  . Darvon Itching  . Erythromycin Rash  . Meloxicam Other (See Comments)    Unknown    . Norco [Hydrocodone-Acetaminophen] Itching  . Penicillins Rash    Has patient had a PCN reaction causing immediate rash, facial/tongue/throat swelling, SOB or lightheadedness with hypotension: unknown Has patient had a PCN reaction causing severe rash involving mucus membranes or skin necrosis: no Has patient had a PCN reaction that required hospitalization: unknown Has patient had a PCN reaction occurring within the last 10 years: no If all of the above answers are "NO", then may proceed with Cephalosporin use.   Marland Kitchen Propoxyphene N-Acetaminophen Itching  . Rofecoxib Other (See Comments)    Unknown    . Rosuvastatin Other (See Comments)    cramps  . Statins Itching and Other (See Comments)    Sleeplessness  . Sulfa Antibiotics Rash    Patient Measurements: Height: 5\' 3"  (160 cm) Weight: 183 lb 6.8 oz (83.2 kg) IBW/kg (Calculated) : 52.4 Heparin Dosing Weight: use actual weight  Vital Signs: Temp: 97.8 F (36.6 C) (11/30 0717) Temp Source: Oral (11/30 0717) BP: 131/75 (11/30 0800) Pulse Rate: 84 (11/30 0800)  Labs:  Recent Labs  05/14/16 0909  05/14/16 1208 05/14/16 1514 05/14/16 2134 05/14/16 2247 05/15/16 0248 05/15/16 0926  HGB 12.8  --   --   --   --   --   --   --   HCT 40.3  --   --   --   --   --   --   --   PLT 342  --   --   --   --   --   --   --   APTT  --   --   --  39*  --   --   --   --   HEPARINUNFRC  --   --   --   --   --  0.10*  --  0.30  CREATININE 1.64*  --   --   --   --   --  1.34*  --   CKTOTAL  --   --  1,073*  --   --   --  734* 578*  TROPONINI  --   < > 11.34*  --  9.73*  --  8.24*  7.76*  < > = values in this interval not displayed.  Estimated Creatinine Clearance: 35.3 mL/min (by C-G formula based on SCr of 1.34 mg/dL (H)).  Assessment: 89 yoF referred to ED with productive cough.  She has a cardiac hx & was on Plavix PTA.  Starting heparin for elevated troponins. She is known to have moderate to severe CAD per cath in 2009.  Heparin level therapeutic at 0.30 on gtt at 1250 units/hr. CBC okay on admit. Will re-check. No signs of bleeding reported.   Goal of Therapy:  Heparin level 0.3-0.7 units/ml Monitor platelets by anticoagulation protocol: Yes   Plan:  Increase heparin to 1300 units/hr  Will f/u 8 hr heparin level Daily heparin level and CBC  Monitor for signs and symptoms of bleeding.   Uvaldo Bristle, PharmD PGY1 Pharmacy Resident Pager: [phone]  05/15/2016,10:54 AM

## 2016-05-15 NOTE — Progress Notes (Signed)
SUBJECTIVE: Pt somnolent.   Tele: paced  BP 114/60   Pulse 91   Temp 97.8 F (36.6 C) (Oral)   Resp (!) 35   Ht 5\' 3"  (1.6 m)   Wt 183 lb 6.8 oz (83.2 kg)   SpO2 94%   BMI 32.49 kg/m   Intake/Output Summary (Last 24 hours) at 05/15/16 0809 Last data filed at 05/15/16 0600  Gross per 24 hour  Intake           167.58 ml  Output               16 ml  Net           151.58 ml    PHYSICAL EXAM General: Well developed, well nourished, somnolent. Ill appearing Neck: No JVD. No masses noted.  Lungs: Coarse BS bilaterally with wheezing, poor air movement Heart: RRR Abdomen: Bowel sounds are present. Soft, non-tender.  Extremities: No lower extremity edema.   LABS: Basic Metabolic Panel:  Recent Labs  05/14/16 0909 05/14/16 1208 05/15/16 0248  NA 135  --  137  K 2.6*  --  3.3*  CL 95*  --  100*  CO2 28  --  26  GLUCOSE 174*  --  172*  BUN 24*  --  20  CREATININE 1.64*  --  1.34*  CALCIUM 8.6*  --  8.6*  MG  --  1.9 1.7   CBC:  Recent Labs  05/14/16 0909  WBC 18.1*  HGB 12.8  HCT 40.3  MCV 96.4  PLT 342   Cardiac Enzymes:  Recent Labs  05/14/16 1208 05/14/16 2134 05/15/16 0248  CKTOTAL 1,073*  --  734*  TROPONINI 11.34* 9.73* 8.24*   Fasting Lipid Panel:  Recent Labs  05/15/16 0248  CHOL 193  HDL 25*  LDLCALC 145*  TRIG 116  CHOLHDL 7.7    Current Meds: . allopurinol  300 mg Oral Daily  . aspirin  324 mg Oral Once  . bisoprolol  10 mg Oral Daily  . doxycycline  100 mg Oral Q12H  . DULoxetine  60 mg Oral BID  . guaiFENesin  1,200 mg Oral BID  . insulin aspart  0-9 Units Subcutaneous Q4H  . ipratropium-albuterol  3 mL Nebulization Q6H  . lactated ringers  1,000 mL Intravenous Once  . predniSONE  40 mg Oral Q breakfast     ASSESSMENT AND PLAN:  1. Acute respiratory failure/COPD exacerbation: It appears that she has a COPD exacerbation. She has had a cough productive of yellow sputum, weakness and worsening dyspnea for several  weeks. ON arrival she was profoundly hypoxic. I think this is the reason for her respiratory distress and altered mental status as she was hypoxic on arrival with no signs of volume overload or CHF. Weight is stable at baseline. No LE edema. Her troponin is elevated after a prolonged down time at home (CK also over 1000). The troponin elevation likely represents demand ischemia in a patient with known moderately severe CAD (75% stenosis in LAD and 75% stenosis in RCA by cath in 2009). She will need an ischemic evaluation before discharge but it is not likely her CAD is responsible for her presentation this admission. I would treat her respiratory issues first and then cardiac workup can be planned.  -She has gotten worse this am. I would ask PCCM to see her soon this am. I would repeat an ABG now.    2. CAD/Elevated troponin: I do not think  her troponin elevation represents true ACS.(see above). Trending down. She is known to have moderately severe CAD by cath in 2009 with ongoing tobacco abuse. She probably has progression of CAD. Ischemic eval before discharge after COPD exacerbation is resolved. Continue IV heparin for now. Beta blocker changed to bisoprolol.   3. HTN: BP stable.   4. Cardiomyopathy: ICD in place. Recent echo Sept 2017 with LvEF=40-45%.  Limited echo pending today.   Lauree Chandler  11/30/20178:09 AM

## 2016-05-15 NOTE — Progress Notes (Addendum)
Called by bedside RN at 900 with update on patient who was seen and transferred to SDU last night with RR RN.  RN updated that per Cardiology MD, patient might need PCCM to evaluate respiratory status.  RN stated that primary MD was called.  Both PCCM MD and Primary MD were present.   I arrived as soon as I could at 930.  Plan reviewed with Primary RN, MD, and CCM MD, along with RT.  Assessment of patient is as follows, patient is arousable but lethargic (was positive for benzo/opiates on admission, was given NARCAN last night), mental status remained the same.  Patient did respond to voice and pain.  Lung sounds clear in upper fields, diminished in lower fields.  Patient is mouth breathing, RR mild tachypnea (non-compliant, current smoker COPD, baseline 2L [place] oxygen at home).  Pulses present and skin warm.   SBP in the 120s and BP have been stable.  Patient on 6L [place], per MD will maintain sats greater 88%. Patient is not in acute respiratory distress, + NSTEMI per enyzmes, troponin leak might be demand ischemia, patient on heparin drip now per CARDS. No complaints of pain per patient.  Prior to my arrival, ABG was obtained, IV steroids/ABX ordered, Duo-Nebs were ordered by primary MD.  RSV ordered and patient placed on droplet. CXR from earlier was also review with MD, lasix was ordered along with foley insertion orders.   I reviewed the plan with the RN and primary MD, no interventions needed from RR RN at this time.  Patient was assessed in person, therefore will be followed and monitored.  Called RN at 1600 followup, I spoke with the MD in the afternoon as well, updated me that BIPAP was started for a short period, patient's neuro status improved and respiratory status is stable.

## 2016-05-15 NOTE — Plan of Care (Signed)
Was called by nursing staff to be notified patient has arrived to the floor patient appears to be tachypneic and somewhat confused cardiology at bedside ordered ABG. Patient appears to be a transfer from [facility] with NSTEMI and COPD exacerbation. Patient was noted to have increased work of breathing troponin 11.3 creatinine 1.6 She is already on heparin drip. Respiration rate up to 30s patient continues to wheeze and using accessory muscles Potassium 2.6 patient undergoing replacement CK elevated 1073  ABG resulted as  pH 7.454 (H)    pCO2 arterial 40.6 mmHg   pO2, Arterial 64.1 (L)    Physical exam was significant for bilateral wheezing patient somewhat somnolent but able to answer questions mucous membranes appears to be dry. Given increased work of breathing patient was transferred to stepdown. Given COPD exacerbation and her metoprolol was switched to bisoprolol Discuss case with cardiology who recommends continue heparin and continue cycle cardiac enzymes which has been Trending down. Continue to obtain serial CK  cardiology will continue to follow  [place] 2:30 AM

## 2016-05-16 ENCOUNTER — Encounter (HOSPITAL_COMMUNITY): Payer: Self-pay

## 2016-05-16 DIAGNOSIS — J9621 Acute and chronic respiratory failure with hypoxia: Secondary | ICD-10-CM

## 2016-05-16 DIAGNOSIS — R400 Somnolence: Secondary | ICD-10-CM

## 2016-05-16 DIAGNOSIS — I214 Non-ST elevation (NSTEMI) myocardial infarction: Secondary | ICD-10-CM

## 2016-05-16 DIAGNOSIS — J449 Chronic obstructive pulmonary disease, unspecified: Secondary | ICD-10-CM

## 2016-05-16 DIAGNOSIS — I272 Pulmonary hypertension, unspecified: Secondary | ICD-10-CM

## 2016-05-16 DIAGNOSIS — I5042 Chronic combined systolic (congestive) and diastolic (congestive) heart failure: Secondary | ICD-10-CM

## 2016-05-16 DIAGNOSIS — I255 Ischemic cardiomyopathy: Secondary | ICD-10-CM

## 2016-05-16 DIAGNOSIS — G4733 Obstructive sleep apnea (adult) (pediatric): Secondary | ICD-10-CM

## 2016-05-16 DIAGNOSIS — I340 Nonrheumatic mitral (valve) insufficiency: Secondary | ICD-10-CM

## 2016-05-16 LAB — RESPIRATORY PANEL BY PCR

## 2016-05-16 LAB — BASIC METABOLIC PANEL
Anion gap: 12 (ref 5–15)
BUN: 38 mg/dL — AB (ref 6–20)
CHLORIDE: 103 mmol/L (ref 101–111)
CO2: 25 mmol/L (ref 22–32)
CREATININE: 1.76 mg/dL — AB (ref 0.44–1.00)
Calcium: 9.3 mg/dL (ref 8.9–10.3)
GFR, EST AFRICAN AMERICAN: 31 mL/min — AB (ref >60)
GFR, EST NON AFRICAN AMERICAN: 27 mL/min — AB (ref >60)
Glucose, Bld: 185 mg/dL — ABNORMAL HIGH (ref 65–99)
POTASSIUM: 4.1 mmol/L (ref 3.5–5.1)
SODIUM: 140 mmol/L (ref 135–145)

## 2016-05-16 LAB — GLUCOSE, CAPILLARY
Glucose-Capillary: 204 mg/dL — ABNORMAL HIGH (ref 65–99)
Glucose-Capillary: 207 mg/dL — ABNORMAL HIGH (ref 65–99)
Glucose-Capillary: 216 mg/dL — ABNORMAL HIGH (ref 65–99)

## 2016-05-16 LAB — CBC
HCT: 35 % — ABNORMAL LOW (ref 36.0–46.0)
Hemoglobin: 11.4 g/dL — ABNORMAL LOW (ref 12.0–15.0)
MCH: 31.1 pg (ref 26.0–34.0)
MCHC: 32.6 g/dL (ref 30.0–36.0)
MCV: 95.6 fL (ref 78.0–100.0)
PLATELETS: 276 10*3/uL (ref 150–400)
RBC: 3.66 MIL/uL — AB (ref 3.87–5.11)
RDW: 14.9 % (ref 11.5–15.5)
WBC: 17.9 10*3/uL — AB (ref 4.0–10.5)

## 2016-05-16 LAB — CK
CK TOTAL: 83 U/L (ref 38–234)
Total CK: 147 U/L (ref 38–234)
Total CK: 111 U/L (ref 38–234)

## 2016-05-16 LAB — HEPARIN LEVEL (UNFRACTIONATED)
HEPARIN UNFRACTIONATED: 0.34 [IU]/mL (ref 0.30–0.70)
HEPARIN UNFRACTIONATED: 0.4 [IU]/mL (ref 0.30–0.70)

## 2016-05-16 LAB — LEGIONELLA PNEUMOPHILA SEROGP 1 UR AG: L. PNEUMOPHILA SEROGP 1 UR AG: NEGATIVE

## 2016-05-16 LAB — URINE CULTURE: Culture: NO GROWTH

## 2016-05-16 LAB — MAGNESIUM: MAGNESIUM: 2.4 mg/dL (ref 1.7–2.4)

## 2016-05-16 MED ORDER — INSULIN GLARGINE 100 UNIT/ML ~~LOC~~ SOLN
5.0000 [IU] | Freq: Every day | SUBCUTANEOUS | Status: DC
  Administered 2016-05-16 – 2016-05-18 (×3): 5 [IU] via SUBCUTANEOUS
  Filled 2016-05-16 (×3): qty 0.05

## 2016-05-16 MED ORDER — FUROSEMIDE 40 MG PO TABS: 40.0000 mg | ORAL_TABLET | Freq: Two times a day (BID) | ORAL | Status: DC

## 2016-05-16 MED ORDER — PREDNISONE 50 MG PO TABS
50.0000 mg | ORAL_TABLET | Freq: Every day | ORAL | Status: DC
  Administered 2016-05-17 – 2016-05-19 (×3): 50 mg via ORAL
  Filled 2016-05-16 (×3): qty 1

## 2016-05-16 MED ORDER — FUROSEMIDE 40 MG PO TABS
40.0000 mg | ORAL_TABLET | Freq: Two times a day (BID) | ORAL | Status: DC
  Administered 2016-05-16 – 2016-05-17 (×3): 40 mg via ORAL
  Filled 2016-05-16 (×4): qty 1

## 2016-05-16 MED ORDER — INSULIN ASPART 100 UNIT/ML ~~LOC~~ SOLN
0.0000 [IU] | SUBCUTANEOUS | Status: DC
  Administered 2016-05-16 (×2): 5 [IU] via SUBCUTANEOUS
  Administered 2016-05-17 (×2): 2 [IU] via SUBCUTANEOUS
  Administered 2016-05-17: 3 [IU] via SUBCUTANEOUS
  Administered 2016-05-17: 2 [IU] via SUBCUTANEOUS
  Administered 2016-05-17: 3 [IU] via SUBCUTANEOUS
  Administered 2016-05-18: 5 [IU] via SUBCUTANEOUS
  Administered 2016-05-18 (×2): 8 [IU] via SUBCUTANEOUS
  Administered 2016-05-18: 3 [IU] via SUBCUTANEOUS

## 2016-05-16 MED ORDER — CHLORHEXIDINE GLUCONATE 0.12 % MT SOLN
15.0000 mL | Freq: Two times a day (BID) | OROMUCOSAL | Status: DC
  Administered 2016-05-16 – 2016-05-23 (×13): 15 mL via OROMUCOSAL
  Filled 2016-05-16 (×12): qty 15

## 2016-05-16 MED ORDER — ATORVASTATIN CALCIUM 40 MG PO TABS
40.0000 mg | ORAL_TABLET | Freq: Every day | ORAL | Status: DC
  Administered 2016-05-16 – 2016-05-23 (×8): 40 mg via ORAL
  Filled 2016-05-16 (×8): qty 1

## 2016-05-16 MED ORDER — ORAL CARE MOUTH RINSE
15.0000 mL | Freq: Two times a day (BID) | OROMUCOSAL | Status: DC
  Administered 2016-05-16 – 2016-05-23 (×8): 15 mL via OROMUCOSAL

## 2016-05-16 NOTE — Progress Notes (Signed)
PULMONARY / CRITICAL CARE MEDICINE   Name: Sydney Phillips MRN: [id number] DOB: [birth year]    ADMISSION DATE:  05/14/2016 CONSULTATION DATE:  05/15/16   REFERRING MD:  Dr. Sherral Hammers / [facility]   CHIEF COMPLAINT:  Respiratory Failure   HISTORY OF PRESENT ILLNESS:   80 y/o F, current smoker, with PMH of arthritis, chronic back pain, depression with anxiety, fibromyalgia, CKD III, CAD s/p angioplasty, PVD s/p fem-pop bypass, carotid artery disease s/p L CEA, L subclavian artery stenosis s/p stent 07/2011, CHF s/p AICD, HTN, HLD, LBBB, GERD and O2 dependent COPD who presented to [facility] on 11/29 accompanied by her son with reports of increased falls.    Family reported on admission that she had 3 falls the week prior to presentation.  She was found down on the floor at home (unknown downtime).  At baseline, family reports she is normally active.  ER examination found her to be somnolent but arousable.  Labs - K 2.6, Cl 95, glucose 174, BUN 24, Sr Cr 1.64, WBC 18.1, troponin 11.34, CK 1073, UDS positive for opiates + benzos.  She was admitted per [facility] for possible NSTEMI, AMS, chronic hypoxic respiratory failure & hypokalemia.  Cardiology was consulted for evaluation.  The patient was noted to remain lethargic.  Rapid response was called around MN on 11/30 and an ABG was assessed > 7.45 / 40 / 64.1 / 28.1.  Per notes at that time, she had increased wheezing on exam.  Medications were adjusted from metoprolol to bisoprolol.  Am of 11/30 she was noted to have ongoing somnolence.  Morphine was administered by primary service for possible CHF exacerbation.    PCCM consulted for evaluation of respiratory failure.     SUBJECTIVE: breathing better Denies pain afebrile  VITAL SIGNS: BP (!) 96/52 (BP Location: Right Arm)   Pulse 71   Temp 97.3 F (36.3 C) (Oral)   Resp (!) 23   Ht 5\' 3"  (1.6 m)   Wt 182 lb 8.7 oz (82.8 kg)   SpO2 97%   BMI 32.34 kg/m   HEMODYNAMICS:    VENTILATOR SETTINGS: Vent Mode:  PCV;BIPAP FiO2 (%):  [40 %] 40 % Set Rate:  [12 bmp] 12 bmp PEEP:  [6 cmH20] 6 cmH20  INTAKE / OUTPUT: I/O last 3 completed shifts: In: 1180.2 [I.V.:500.2; IV Piggyback:680] Out: 755 [Urine:755]  PHYSICAL EXAMINATION: Gen. Elderly, well-nourished, in no resp distress ENT - no pallor,icterus, no post nasal drip Neck: No JVD, no thyromegaly, no carotid bruits Lungs: no use of accessory muscles, no dullness to percussion, clear BS, no rhonchi Cardiovascular: Rhythm regular, heart sounds  normal, no murmurs, no peripheral edema Abdomen: soft and non-tender, no hepatosplenomegaly, BS normal. Musculoskeletal: No deformities, no cyanosis or clubbing Neuro:  Awake,alert, non focal,  Skin:  Warm, no lesions/ rash   LABS:  BMET  Recent Labs Lab 05/14/16 0909 05/15/16 0248 05/15/16 1927  NA 135 137  --   K 2.6* 3.3* 3.9  CL 95* 100*  --   CO2 28 26  --   BUN 24* 20  --   CREATININE 1.64* 1.34*  --   GLUCOSE 174* 172*  --     Electrolytes  Recent Labs Lab 05/14/16 0909 05/14/16 1208 05/15/16 0248 05/15/16 1927  CALCIUM 8.6*  --  8.6*  --   MG  --  1.9 1.7 2.3    CBC  Recent Labs Lab 05/14/16 0909 05/15/16 1117 05/16/16 0251  WBC 18.1* 20.8* 17.9*  HGB 12.8  11.6* 11.4*  HCT 40.3 36.6 35.0*  PLT 342 293 276    Coag's  Recent Labs Lab 05/14/16 1514  APTT 39*    Sepsis Markers  Recent Labs Lab 05/15/16 1117  LATICACIDVEN 1.6    ABG  Recent Labs Lab 05/14/16 2202 05/15/16 0850  PHART 7.454* 7.424  PCO2ART 40.6 41.9  PO2ART 64.1* 62.5*    Liver Enzymes No results for input(s): AST, ALT, ALKPHOS, BILITOT, ALBUMIN in the last 168 hours.  Cardiac Enzymes  Recent Labs Lab 05/14/16 2134 05/15/16 0248 05/15/16 0926  TROPONINI 9.73* 8.24* 7.76*    Glucose  Recent Labs Lab 05/15/16 0723 05/15/16 1104 05/15/16 1538 05/15/16 1948 05/15/16 2345 05/16/16 0409  GLUCAP 183* 172* 184* 227* 187* 204*    Imaging No results  found.   STUDIES:  CT Head / Cervical Spine 11/29 >> neg for bleed or acute process, atrophy and non-specific white matter changes, negative for cervical fracture  CULTURES: BCx2 11/29 >>  UC 11/29 >>  RVP PCR 11/29 >>  Strep ag neg  ANTIBIOTICS: Doxy 11/29 >> 11/30 Levaquin 11/30 >>   SIGNIFICANT EVENTS: 11/29  Admit with AMS, hypoxic resp fx  LINES/TUBES:   DISCUSSION: 80 y/o F with PMH of significant coronary disease / PVD and O2 dependent COPD admitted with AMS, NSTEMI (trop > 11).  CT head negative.    ASSESSMENT / PLAN:  PULMONARY A: Acute Hypoxic Respiratory Failure  O2 Dependent COPD  P:   Dc BiPAP  Avoid sedating medications  Solumedrol 60 mg Q24- change to prednisone & taper over 1 week Duoneb Q6 + PRN xopenx levaquin x 5ds   CARDIOVASCULAR A:  NSTEMI Peak troponin  8 CAD  PVD  Hx Angioplasty,  Stenosis, L CEA  Mod MR? ischemic P:  Cardiology following -cath planned next week    NEUROLOGIC A:   Acute Encephalopathy - CT head negative , resolved Hx Chronic pain, fibromyalgia, anxiety  P:   Minimize narcotics Reduce home anti-anxiety meds    PCCM available as needed  Kara Mead MD. FCCP. [place] Pager [phone] If no response call [phone]   05/16/2016

## 2016-05-16 NOTE — Progress Notes (Signed)
ANTICOAGULATION CONSULT NOTE - Follow-up Consult  Pharmacy Consult for Heparin Indication: chest pain/ACS  Allergies  Allergen Reactions  . Potassium-Containing Compounds Other (See Comments)    Causes severe constipation  . Azithromycin Rash  . Codeine Itching  . Darvon Itching  . Erythromycin Rash  . Meloxicam Other (See Comments)    Unknown    . Norco [Hydrocodone-Acetaminophen] Itching  . Penicillins Rash    Has patient had a PCN reaction causing immediate rash, facial/tongue/throat swelling, SOB or lightheadedness with hypotension: unknown Has patient had a PCN reaction causing severe rash involving mucus membranes or skin necrosis: no Has patient had a PCN reaction that required hospitalization: unknown Has patient had a PCN reaction occurring within the last 10 years: no If all of the above answers are "NO", then may proceed with Cephalosporin use.   Marland Kitchen Propoxyphene N-Acetaminophen Itching  . Rofecoxib Other (See Comments)    Unknown    . Rosuvastatin Other (See Comments)    cramps  . Statins Itching and Other (See Comments)    Sleeplessness  . Sulfa Antibiotics Rash    Patient Measurements: Height: 5\' 3"  (160 cm) Weight: 183 lb 6.8 oz (83.2 kg) IBW/kg (Calculated) : 52.4 Heparin Dosing Weight: 70kg  Vital Signs: Temp: 98.2 F (36.8 C) (11/30 2000) Temp Source: Oral (11/30 2000) BP: 96/52 (12/01 0200) Pulse Rate: 71 (12/01 0200)  Labs:  Recent Labs  05/14/16 0909  05/14/16 1514 05/14/16 2134  05/15/16 0248 05/15/16 0926 05/15/16 1117 05/15/16 1927 05/16/16 0251  HGB 12.8  --   --   --   --   --   --  11.6*  --  11.4*  HCT 40.3  --   --   --   --   --   --  36.6  --  35.0*  PLT 342  --   --   --   --   --   --  293  --  276  APTT  --   --  39*  --   --   --   --   --   --   --   HEPARINUNFRC  --   --   --   --   < >  --  0.30  --  0.29* 0.34  CREATININE 1.64*  --   --   --   --  1.34*  --   --   --   --   CKTOTAL  --   < >  --   --   --  734*  578*  --  297*  --   TROPONINI  --   < >  --  9.73*  --  8.24* 7.76*  --   --   --   < > = values in this interval not displayed.  Estimated Creatinine Clearance: 35.3 mL/min (by C-G formula based on SCr of 1.34 mg/dL (H)).  Assessment: 51 yoF on heparin for NSTEMI. Heparin level therapeutic (0.34) on 1400 units/hr. No bleeding noted.  Goal of Therapy:  Heparin level 0.3-0.7 units/ml Monitor platelets by anticoagulation protocol: Yes   Plan:  Continue heparin at 1400 units/hr  Will f/u 6 hr confirmatory heparin level  Sherlon Handing, PharmD, BCPS Clinical pharmacist, pager [phone] 05/16/2016 3:26 AM

## 2016-05-16 NOTE — Progress Notes (Signed)
CPT not performed due to pt eating lunch at this time. RT will check back.

## 2016-05-16 NOTE — Care Management Note (Signed)
Case Management Note  Patient Details  Name: Sydney Phillips MRN: [id number] Date of Birth: 06/18/1935  Subjective/Objective:   Adm w nstemi,resp distress                Action/Plan:to return home w fam and thn for vis nse   Expected Discharge Date:               Expected Discharge Plan:  [place]  In-House Referral:     Discharge planning Services  CM Consult  Post Acute Care Choice:  Resumption of Svcs/PTA Provider Choice offered to:     DME Arranged:    DME Agency:     HH Arranged:  Disease Management, RN [place] Agency:  [place]  Status of Service:  In process, will continue to follow  If discussed at Long Length of Stay Meetings, dates discussed:    Additional Comments: pt was active w thn for nse pta. [place] w thn aware pt in hosp.  Lacretia Leigh, RN 05/16/2016, 2:51 PM

## 2016-05-16 NOTE — Progress Notes (Signed)
[facility] for Heparin Indication: chest pain/ACS  Allergies  Allergen Reactions  . Potassium-Containing Compounds Other (See Comments)    Causes severe constipation  . Azithromycin Rash  . Codeine Itching  . Darvon Itching  . Erythromycin Rash  . Meloxicam Other (See Comments)    Unknown    . Norco [Hydrocodone-Acetaminophen] Itching  . Penicillins Rash    Has patient had a PCN reaction causing immediate rash, facial/tongue/throat swelling, SOB or lightheadedness with hypotension: unknown Has patient had a PCN reaction causing severe rash involving mucus membranes or skin necrosis: no Has patient had a PCN reaction that required hospitalization: unknown Has patient had a PCN reaction occurring within the last 10 years: no If all of the above answers are "NO", then may proceed with Cephalosporin use.   Marland Kitchen Propoxyphene N-Acetaminophen Itching  . Rofecoxib Other (See Comments)    Unknown    . Rosuvastatin Other (See Comments)    cramps  . Statins Itching and Other (See Comments)    Sleeplessness  . Sulfa Antibiotics Rash    Patient Measurements: Height: 5\' 3"  (160 cm) Weight: 182 lb 8.7 oz (82.8 kg) IBW/kg (Calculated) : 52.4 Heparin Dosing Weight: 70kg  Vital Signs: Temp: 97.3 F (36.3 C) (12/01 0825) Temp Source: Oral (12/01 0825) BP: 110/58 (12/01 1000) Pulse Rate: 77 (12/01 1000)  Labs:  Recent Labs  05/14/16 0909  05/14/16 1514 05/14/16 2134  05/15/16 0248 05/15/16 0926 05/15/16 1117 05/15/16 1927 05/16/16 0251 05/16/16 0918  HGB 12.8  --   --   --   --   --   --  11.6*  --  11.4*  --   HCT 40.3  --   --   --   --   --   --  36.6  --  35.0*  --   PLT 342  --   --   --   --   --   --  293  --  276  --   APTT  --   --  39*  --   --   --   --   --   --   --   --   HEPARINUNFRC  --   --   --   --   < >  --  0.30  --  0.29* 0.34 0.40  CREATININE 1.64*  --   --   --   --  1.34*  --   --   --   --  1.76*  CKTOTAL  --    < >  --   --   --  734* 578*  --  297* 147  --   TROPONINI  --   < >  --  9.73*  --  8.24* 7.76*  --   --   --   --   < > = values in this interval not displayed.  Estimated Creatinine Clearance: 26.9 mL/min (by C-G formula based on SCr of 1.76 mg/dL (H)).  Assessment: 5 yoF on heparin for NSTEMI.   Heparin level therapeutic (0.4) on 1400 units/hr. CBC stable, no bleeding noted. Planning cath once respiratory issues are stabilized.   Goal of Therapy:  Heparin level 0.3-0.7 units/ml Monitor platelets by anticoagulation protocol: Yes   Plan:  Continue heparin at 1400 units/hr  Daily HL/CBC  Erin Hearing PharmD., BCPS Clinical Pharmacist Pager [phone] 05/16/2016 10:59 AM

## 2016-05-16 NOTE — Progress Notes (Signed)
SUBJECTIVE: No chest pain. Dyspnea is better.   BP (!) 96/52 (BP Location: Right Arm)   Pulse 71   Temp 98.2 F (36.8 C) (Oral)   Resp (!) 23   Ht 5\' 3"  (1.6 m)   Wt 182 lb 8.7 oz (82.8 kg)   SpO2 94%   BMI 32.34 kg/m   Intake/Output Summary (Last 24 hours) at 05/16/16 [id number] Last data filed at 05/16/16 0700  Gross per 24 hour  Intake          1012.58 ml  Output              755 ml  Net           257.58 ml    PHYSICAL EXAM General: Well developed, well nourished, somnolent.  Neck: No JVD. No masses noted.  Lungs: Coarse BS bilaterally with wheezing, poor air movement Heart: RRR Abdomen: Bowel sounds are present. Soft, non-tender.  Extremities: No lower extremity edema.   LABS: Basic Metabolic Panel:  Recent Labs  05/14/16 0909  05/15/16 0248 05/15/16 1927  NA 135  --  137  --   K 2.6*  --  3.3* 3.9  CL 95*  --  100*  --   CO2 28  --  26  --   GLUCOSE 174*  --  172*  --   BUN 24*  --  20  --   CREATININE 1.64*  --  1.34*  --   CALCIUM 8.6*  --  8.6*  --   MG  --   < > 1.7 2.3  < > = values in this interval not displayed. CBC:  Recent Labs  05/15/16 1117 05/16/16 0251  WBC 20.8* 17.9*  NEUTROABS 17.9*  --   HGB 11.6* 11.4*  HCT 36.6 35.0*  MCV 95.6 95.6  PLT 293 276   Cardiac Enzymes:  Recent Labs  05/14/16 2134 05/15/16 0248 05/15/16 0926 05/15/16 1927 05/16/16 0251  CKTOTAL  --  734* 578* 297* 147  TROPONINI 9.73* 8.24* 7.76*  --   --    Fasting Lipid Panel:  Recent Labs  05/15/16 0248  CHOL 193  HDL 25*  LDLCALC 145*  TRIG 116  CHOLHDL 7.7    Current Meds: . allopurinol  300 mg Oral Daily  . aspirin  324 mg Oral Once  . bisoprolol  10 mg Oral Daily  . chlorhexidine  15 mL Mouth Rinse BID  . dextromethorphan-guaiFENesin  1 tablet Oral BID  . DULoxetine  60 mg Oral BID  . insulin aspart  0-9 Units Subcutaneous Q4H  . ipratropium-albuterol  3 mL Nebulization Q6H  . lactated ringers  1,000 mL Intravenous Once  .  levofloxacin (LEVAQUIN) IV  750 mg Intravenous Q48H  . mouth rinse  15 mL Mouth Rinse q12n4p  . methylPREDNISolone (SOLU-MEDROL) injection  60 mg Intravenous Daily   Echo 05/15/16: Left ventricle: The cavity size was normal. Systolic function was   severely reduced. The estimated ejection fraction was in the   range of 25% to 30%. Diffuse hypokinesis. Features are consistent   with a pseudonormal left ventricular filling pattern, with   concomitant abnormal relaxation and increased filling pressure   (grade 2 diastolic dysfunction). Doppler parameters are   consistent with elevated ventricular end-diastolic filling   pressure. - Ventricular septum: The contour showed diastolic flattening and   systolic flattening. - Aortic valve: Trileaflet; mildly thickened, mildly calcified   leaflets. There was no regurgitation. - Aortic root:  The aortic root was normal in size. - Mitral valve: Structurally normal valve. There was moderate to   severe regurgitation. - Left atrium: The atrium was moderately dilated. - Right ventricle: The cavity size was normal. Wall thickness was   normal. Systolic function was normal. - Right atrium: Pacer wire or catheter noted in right atrium. - Tricuspid valve: There was moderate regurgitation. - Pulmonic valve: Transvalvular velocity was within the normal   range. There was no evidence for stenosis. - Pulmonary arteries: Systolic pressure was moderately to severely   increased. PA peak pressure: 52 mm Hg (S). - Pericardium, extracardiac: There was no pericardial effusion. Impressions:  - When compared to the prior study from 02/27/2016 LVEF has further   decreased, now 25-30% with diffuse hypokinesis. Elevated filling   pressures.   Mitral regurgitation is at least moderate to severe.   Moderate to severe pulmonary hypertension, RVSP 52 mmHg.  ASSESSMENT AND PLAN:  1. Acute respiratory failure/COPD exacerbation: Her respiratory failure is felt to be  due to a COPD exacerbation. She has had a cough productive of yellow sputum, weakness and worsening dyspnea for several weeks. On arrival she was profoundly hypoxic. She has no signs of volume overload or CHF. Weight is stable at baseline. No LE edema. Her troponin is elevated after a prolonged down time at home (CK also over 1000). The troponin elevation likely represents demand ischemia in a patient with known moderately severe CAD (75% stenosis in LAD and 75% stenosis in RCA by cath in 2009). She will need an ischemic evaluation before discharge but it is not likely her CAD is responsible for her presentation this admission. I would treat her respiratory issues first and then cardiac workup can be planned.   2. CAD/Elevated troponin: I do not think her troponin elevation represents true ACS.(see above). Trending down. She is known to have moderately severe CAD by cath in 2009 with ongoing tobacco abuse. She probably has progression of CAD. Ischemic eval before discharge after COPD exacerbation is resolved. Continue IV heparin for now. Beta blocker changed to bisoprolol.   3. HTN: BP stable.   4. Cardiomyopathy: ICD in place. Echo Sept 2017 with LVEF=40-45%. Limited echo this admission with LVEF=25-30%. Medical management for now. Will likely need cardiac cath before discharge.   5. Mitral regurgitation: Moderate to severe. This has changed since her echo in September 2017. ? Ischemic MR. As above, will need cath next week when resp issues are stable.    Lauree Chandler  12/1/20177:18 AM

## 2016-05-16 NOTE — Progress Notes (Signed)
PROGRESS NOTE    Sydney Phillips  [id number] DOB: [birth year] DOA: 05/14/2016 PCP: Laurey Morale, MD   Brief Narrative:  80 y.o. WF PMHx Depression with Anxiety,Fibromyalgia, HTN, MI, LBBB, CAD, Chronic Systolic and Diastolic CHF s/p AICD, carotid artery disease s/p left CEA, left subclavian artery stenosis s/p stent, PAD s/p right fem-pop, COPD on 2 L O2 at home, OSA, hyperlipidemia. Chronic pain syndrome, Diabetes mellitus without complication   Patient presented to the ED after being found down by her son for an unknown amount of time. She has had about 3 falls recently thought to be secondary to medications. She is not sure of why she fell. She reports some associated right sided chest wall pain.    Subjective: 12/1 A/O 4, states has O2 at home but does not use it except when she feels like she short of breath. Continues to smoke. States does not have CPAP machine for her OSA.   Marland Kitchen   Assessment & Plan:   Active Problems:   Essential hypertension   Coronary atherosclerosis   Hypokalemia   NSTEMI (non-ST elevated myocardial infarction) (HCC)   COPD exacerbation (HCC)   Chronic combined systolic and diastolic CHF (congestive heart failure) (HCC)   Encephalopathy   CAD in native artery   Acute renal failure with acute tubular necrosis superimposed on stage 3 chronic kidney disease (HCC)  Altered mental status -Likely multifactorial to include COPD exacerbation, NSTEMI, overuse of Narcotic/Benzodiazepine, noncompliance. -Resolved  Acute on Chronic hypoxic respiratory failure -DuoNeb QID -Albuterol PRN -Mucinex DM BID -Flutter valve -Physiotherapy vest QID -Prednisone 50 mg daily -Titrate O2 to maintain SPO2 > 88% -Complete 5 day course antibiotics -Respiratory virus panel negative, sputum pending,urine negative  OSA and COPD overlap syndrome -CPAP or BiPAP per respiratory: Maintain SPO2> 89%  NSTEMI -Cardiology plans on catheterization when patient more  stable. However does not believe true ACS. Believe progression of CAD   Chronic Systolic and Diastolic CHF/Ischemic Cardiomyopathy -S/P AICD -Cardiology consulted believes patient requires catheterization prior to discharge. However believes current crisis not secondary to acute cardiac issue. Per cardiology last catheterization significant stenosis in LAD and RCA vessels. -Strict in and out since admission + 465 mL -Daily weight Filed Weights   05/14/16 2133 05/15/16 0500 05/16/16 0400  Weight: 79.3 kg (174 lb 13.2 oz) 83.2 kg (183 lb 6.8 oz) 82.8 kg (182 lb 8.7 oz)  -Followed by cardiology outpatient. Significant CAD history. History of EF of 20% in 2009. Last EF of 40-45% with diffuse hypokinesis and grade one diastolic dysfunction in [id number].  -Bisoprolol 10mg  daily -Lasix 40 mg  BID   Mitral valve regurgitation -Per cardiology has worsened since September 2017 -See NSTEMI   Pulmonary hypertension -See CHF  CAD native artery -see CHF  Acute on CKD stage III(baseline Cr 1. Weight to 1.32) Lab Results  Component Value Date   CREATININE 1.34 (H) 05/15/2016   CREATININE 1.64 (H) 05/14/2016   CREATININE 1.92 (H) 04/16/2016    Hypokalemia -Potassium goal> 4  Hypomagnesemia -Magnesium goal> 2  Diabetes type 2 controlled with complication -[id number] Hemoglobin A1c= 6.9 -Lantus 5 units daily -Moderate SSI  HLD -Lipid panel: Not within ADA guidelines -Start Lipitor 40 mg daily: Patient may be allergic to statins will watch closely for itching and discontinue if required. Patient's poor cardiac status necessitates trying a statin but this patient.     DVT prophylaxis: Heparin gtt Code Status: Full Family Communication: None Disposition Plan: ??   Consultants:  [facility]  Procedures/Significant Events:  11/30 Echocardiogram: LVEF= 25% to 30%. Diffuse hypokinesis. - (grade 2 diastolic dysfunction).  - Ventricular septum: The contour showed diastolic flattening  and   systolic flattening. -- Mitral valve: moderate to severe regurgitation. - Left atrium: moderately dilated. - Tricuspid valve: moderate regurgitation.- Pulmonary arteries:  PA peak pressure: 52 mm Hg (S).    VENTILATOR SETTINGS:     Cultures 11/30 sputum pending 11/30 blood NGTD 11/30 respiratory virus panel negative 11/30 urine negative 11/30 urine strep pneumo negative/Legionella pending   Antimicrobials: Levofloxacin 11/30>>   Devices    LINES / TUBES:      Continuous Infusions: . heparin 1,400 Units/hr (05/16/16 0700)     Objective: Vitals:   05/16/16 0600 05/16/16 0700 05/16/16 0825 05/16/16 0838  BP: 116/62 (!) 96/52    Pulse: (!) 43 71    Resp: (!) 21 (!) 23    Temp:   97.3 F (36.3 C)   TempSrc:   Oral   SpO2: 94% 94%  97%  Weight:      Height:        Intake/Output Summary (Last 24 hours) at 05/16/16 0856 Last data filed at 05/16/16 0700  Gross per 24 hour  Intake           987.58 ml  Output              755 ml  Net           232.58 ml   Filed Weights   05/14/16 2133 05/15/16 0500 05/16/16 0400  Weight: 79.3 kg (174 lb 13.2 oz) 83.2 kg (183 lb 6.8 oz) 82.8 kg (182 lb 8.7 oz)    Examination:  General: A/O 4, positive acute on chronic respiratory distress Eyes: negative scleral hemorrhage, negative anisocoria, negative icterus ENT: Negative Runny nose, negative gingival bleeding, Neck:  Negative scars, masses, torticollis, lymphadenopathy, JVD Lungs: negative crackles, negative rhonchi, diminished bibasilar breath sounds  Rt>> Lt  Cardiovascular: Regular rhythm and rate without murmur gallop or rub normal S1 and S2 Abdomen: Morbidly obese, negative abdominal pain, nondistended, positive soft, bowel sounds, no rebound, no ascites, no appreciable mass Extremities: No significant cyanosis, clubbing, or edema bilateral lower extremities Skin: Negative rashes, lesions, ulcers Psychiatric:  Unable to fully assess Central nervous  system:  Cranial nerves II through XII intact, tongue/uvula midline, all extremities muscle strength 5/5, sensation intact throughout, negative dysarthria, negative expressive aphasia, negative receptive aphasia    Data Reviewed: Care during the described time interval was provided by me .  I have reviewed this patient's available data, including medical history, events of note, physical examination, and all test results as part of my evaluation. I have personally reviewed and interpreted all radiology studies.  CBC:  Recent Labs Lab 05/14/16 0909 05/15/16 1117 05/16/16 0251  WBC 18.1* 20.8* 17.9*  NEUTROABS  --  17.9*  --   HGB 12.8 11.6* 11.4*  HCT 40.3 36.6 35.0*  MCV 96.4 95.6 95.6  PLT 342 293 [id number]   Basic Metabolic Panel:  Recent Labs Lab 05/14/16 0909 05/14/16 1208 05/15/16 0248 05/15/16 1927  NA 135  --  137  --   K 2.6*  --  3.3* 3.9  CL 95*  --  100*  --   CO2 28  --  26  --   GLUCOSE 174*  --  172*  --   BUN 24*  --  20  --   CREATININE 1.64*  --  1.34*  --   CALCIUM  8.6*  --  8.6*  --   MG  --  1.9 1.7 2.3   GFR: Estimated Creatinine Clearance: 35.3 mL/min (by C-G formula based on SCr of 1.34 mg/dL (H)). Liver Function Tests: No results for input(s): AST, ALT, ALKPHOS, BILITOT, PROT, ALBUMIN in the last 168 hours. No results for input(s): LIPASE, AMYLASE in the last 168 hours. No results for input(s): AMMONIA in the last 168 hours. Coagulation Profile: No results for input(s): INR, PROTIME in the last 168 hours. Cardiac Enzymes:  Recent Labs Lab 05/14/16 1208 05/14/16 2134 05/15/16 0248 05/15/16 0926 05/15/16 1927 05/16/16 0251  CKTOTAL 1,073*  --  734* 578* 297* 147  TROPONINI 11.34* 9.73* 8.24* 7.76*  --   --    BNP (last 3 results) No results for input(s): PROBNP in the last 8760 hours. HbA1C:  Recent Labs  05/14/16 2134 05/15/16 0248  HGBA1C 6.9* 6.9*   CBG:  Recent Labs Lab 05/15/16 1104 05/15/16 1538 05/15/16 1948  05/15/16 2345 05/16/16 0409  GLUCAP 172* 184* 227* 187* 204*   Lipid Profile:  Recent Labs  05/15/16 0248  CHOL 193  HDL 25*  LDLCALC 145*  TRIG 116  CHOLHDL 7.7   Thyroid Function Tests:  Recent Labs  05/14/16 2134  TSH 1.013   Anemia Panel: No results for input(s): VITAMINB12, FOLATE, FERRITIN, TIBC, IRON, RETICCTPCT in the last 72 hours. Urine analysis:    Component Value Date/Time   COLORURINE YELLOW 05/14/2016 [place] 05/14/2016 1208   LABSPEC 1.018 05/14/2016 1208   PHURINE 5.0 05/14/2016 1208   GLUCOSEU NEGATIVE 05/14/2016 1208   HGBUR NEGATIVE 05/14/2016 1208   HGBUR negative 01/01/2010 1353   BILIRUBINUR NEGATIVE 05/14/2016 1208   BILIRUBINUR n 05/30/2015 1542   KETONESUR NEGATIVE 05/14/2016 1208   PROTEINUR 30 (A) 05/14/2016 1208   UROBILINOGEN 0.2 05/30/2015 1542   UROBILINOGEN 1.0 11/03/2014 1442   NITRITE NEGATIVE 05/14/2016 1208   LEUKOCYTESUR NEGATIVE 05/14/2016 1208   Sepsis Labs: @LABRCNTIP (procalcitonin:4,lacticidven:4)  ) Recent Results (from the past 240 hour(s))  MRSA PCR Screening     Status: None   Collection Time: 05/15/16 12:34 AM  Result Value Ref Range Status   MRSA by PCR NEGATIVE NEGATIVE Final    Comment:        The GeneXpert MRSA Assay (FDA approved for NASAL specimens only), is one component of a comprehensive MRSA colonization surveillance program. It is not intended to diagnose MRSA infection nor to guide or monitor treatment for MRSA infections.   Urine culture     Status: None   Collection Time: 05/15/16 12:30 PM  Result Value Ref Range Status   Specimen Description URINE, CATHETERIZED  Final   Special Requests NONE  Final   Culture NO GROWTH  Final   Report Status 05/16/2016 FINAL  Final         Radiology Studies: Dg Ribs Unilateral W/chest Left  Result Date: 05/14/2016 CLINICAL DATA:  Left-sided chest pain following recent fall, initial encounter EXAM: LEFT RIBS AND CHEST - 3+ VIEW  COMPARISON:  05/14/2016 FINDINGS: Cardiac shadow is again enlarged. A defibrillator is again seen. Poor inspiratory effort is noted with crowding of the vascular markings. Calcified granuloma is noted in the left lung. No acute rib fracture or pneumothorax is noted. IMPRESSION: No evidence of acute rib fracture. Electronically Signed   By: Inez Catalina M.D.   On: 05/14/2016 14:06        Scheduled Meds: . allopurinol  300 mg Oral Daily  .  aspirin  324 mg Oral Once  . bisoprolol  10 mg Oral Daily  . chlorhexidine  15 mL Mouth Rinse BID  . dextromethorphan-guaiFENesin  1 tablet Oral BID  . DULoxetine  60 mg Oral BID  . insulin aspart  0-9 Units Subcutaneous Q4H  . ipratropium-albuterol  3 mL Nebulization Q6H  . lactated ringers  1,000 mL Intravenous Once  . levofloxacin (LEVAQUIN) IV  750 mg Intravenous Q48H  . mouth rinse  15 mL Mouth Rinse q12n4p  . methylPREDNISolone (SOLU-MEDROL) injection  60 mg Intravenous Daily   Continuous Infusions: . heparin 1,400 Units/hr (05/16/16 0700)     LOS: 2 days    Time spent: 40 minutes    WOODS, Geraldo Docker, MD [facility] Pager [phone]   If 7PM-7AM, please contact night-coverage [email or web address] Password [email or web address] 05/16/2016, 8:56 AM

## 2016-05-16 NOTE — Consult Note (Signed)
   [facility] Inpatient Consult   05/16/2016  Sydney Phillips 06-24-1935 [id number]  Patient is currently active with [facility] for chronic disease management services.  Patient has been engaged by a [employer]. Chart review per MD notes reveals the patient is a 80 y/o F, current smoker, with PMH of arthritis, chronic back pain, depression with anxiety, fibromyalgia, CKD III, CAD s/p angioplasty, PVD s/p fem-pop bypass, carotid artery disease s/p L CEA, L subclavian artery stenosis s/p stent 07/2011, HF s/p AICD, HTN, HLD, LBBB, GERD and O2 dependent COPD who presented to [facility] on 11/29 accompanied by her son with reports of increased falls.   Family reported on admission that she had 3 falls the week prior to presentation.  She was found down on the floor at home (unknown downtime).  At baseline, family reports she is normally active.  ER examination found her to be somnolent but arouseable.  Possible HF exacerbation noted.  Will follow for progress.  [facility] nurse aware of admission.    Our community based plan of care has focused on disease management and community resource support.  Patient will receive a post discharge transition of care call and will be evaluated for monthly home visits for assessments and disease process education.  Made Inpatient Case Manager aware that [facility] following for progress and needs. Of note, [facility] Care Management services does not replace or interfere with any services that are needed or arranged by inpatient case management or social work.  For additional questions or referrals please contact:  Natividad Brood, RN BSN [facility] Liaison  [phone] business mobile phone Toll free office [phone]

## 2016-05-17 DIAGNOSIS — N183 Chronic kidney disease, stage 3 (moderate): Secondary | ICD-10-CM

## 2016-05-17 DIAGNOSIS — I255 Ischemic cardiomyopathy: Secondary | ICD-10-CM

## 2016-05-17 DIAGNOSIS — I5042 Chronic combined systolic (congestive) and diastolic (congestive) heart failure: Secondary | ICD-10-CM

## 2016-05-17 DIAGNOSIS — I214 Non-ST elevation (NSTEMI) myocardial infarction: Principal | ICD-10-CM

## 2016-05-17 DIAGNOSIS — N170 Acute kidney failure with tubular necrosis: Secondary | ICD-10-CM

## 2016-05-17 LAB — CBC
HEMATOCRIT: 35.2 % — AB (ref 36.0–46.0)
HEMOGLOBIN: 11.1 g/dL — AB (ref 12.0–15.0)
MCH: 30.1 pg (ref 26.0–34.0)
MCHC: 31.5 g/dL (ref 30.0–36.0)
MCV: 95.4 fL (ref 78.0–100.0)
Platelets: 349 10*3/uL (ref 150–400)
RBC: 3.69 MIL/uL — AB (ref 3.87–5.11)
RDW: 15.1 % (ref 11.5–15.5)
WBC: 27.7 10*3/uL — ABNORMAL HIGH (ref 4.0–10.5)

## 2016-05-17 LAB — BASIC METABOLIC PANEL
Anion gap: 12 (ref 5–15)
BUN: 62 mg/dL — ABNORMAL HIGH (ref 6–20)
CHLORIDE: 103 mmol/L (ref 101–111)
CO2: 24 mmol/L (ref 22–32)
CREATININE: 1.84 mg/dL — AB (ref 0.44–1.00)
Calcium: 8.8 mg/dL — ABNORMAL LOW (ref 8.9–10.3)
GFR calc non Af Amer: 25 mL/min — ABNORMAL LOW (ref >60)
GFR, EST AFRICAN AMERICAN: 29 mL/min — AB (ref >60)
GLUCOSE: 108 mg/dL — AB (ref 65–99)
Potassium: 3.6 mmol/L (ref 3.5–5.1)
Sodium: 139 mmol/L (ref 135–145)

## 2016-05-17 LAB — CK
CK TOTAL: 68 U/L (ref 38–234)
CK TOTAL: 61 U/L (ref 38–234)
Total CK: 61 U/L (ref 38–234)

## 2016-05-17 LAB — GLUCOSE, CAPILLARY
GLUCOSE-CAPILLARY: 152 mg/dL — AB (ref 65–99)
GLUCOSE-CAPILLARY: 168 mg/dL — AB (ref 65–99)
GLUCOSE-CAPILLARY: 178 mg/dL — AB (ref 65–99)
GLUCOSE-CAPILLARY: 190 mg/dL — AB (ref 65–99)
Glucose-Capillary: 167 mg/dL — ABNORMAL HIGH (ref 65–99)
Glucose-Capillary: 134 mg/dL — ABNORMAL HIGH (ref 65–99)
Glucose-Capillary: 118 mg/dL — ABNORMAL HIGH (ref 65–99)
Glucose-Capillary: 144 mg/dL — ABNORMAL HIGH (ref 65–99)
Glucose-Capillary: 127 mg/dL — ABNORMAL HIGH (ref 65–99)

## 2016-05-17 LAB — HEPARIN LEVEL (UNFRACTIONATED)
HEPARIN UNFRACTIONATED: 0.19 [IU]/mL — AB (ref 0.30–0.70)
HEPARIN UNFRACTIONATED: 0.5 [IU]/mL (ref 0.30–0.70)
HEPARIN UNFRACTIONATED: 0.54 [IU]/mL (ref 0.30–0.70)

## 2016-05-17 LAB — MAGNESIUM: Magnesium: 2.3 mg/dL (ref 1.7–2.4)

## 2016-05-17 MED ORDER — FUROSEMIDE 40 MG PO TABS: 40.0000 mg | ORAL_TABLET | Freq: Every day | ORAL | Status: DC

## 2016-05-17 MED ORDER — POTASSIUM CHLORIDE CRYS ER 10 MEQ PO TBCR
EXTENDED_RELEASE_TABLET | ORAL | Status: AC
  Filled 2016-05-17: qty 5

## 2016-05-17 MED ORDER — POTASSIUM CHLORIDE CRYS ER 20 MEQ PO TBCR
50.0000 meq | EXTENDED_RELEASE_TABLET | Freq: Once | ORAL | Status: AC
  Administered 2016-05-17: 50 meq via ORAL

## 2016-05-17 NOTE — Progress Notes (Signed)
Pt is resting comfortably on 2L.  BiPAP not needed at this time.  RT will continue to monitor pt and will placed if needed or requested.

## 2016-05-17 NOTE — Progress Notes (Signed)
Pt complains of pain even after RT changes settings on CPT vest.  CPT treatment stopped.

## 2016-05-17 NOTE — Progress Notes (Signed)
[facility] for Heparin Indication: chest pain/ACS  Allergies  Allergen Reactions  . Potassium-Containing Compounds Other (See Comments)    Causes severe constipation  . Azithromycin Rash  . Codeine Itching  . Darvon Itching  . Erythromycin Rash  . Meloxicam Other (See Comments)    Unknown    . Norco [Hydrocodone-Acetaminophen] Itching  . Penicillins Rash    Has patient had a PCN reaction causing immediate rash, facial/tongue/throat swelling, SOB or lightheadedness with hypotension: unknown Has patient had a PCN reaction causing severe rash involving mucus membranes or skin necrosis: no Has patient had a PCN reaction that required hospitalization: unknown Has patient had a PCN reaction occurring within the last 10 years: no If all of the above answers are "NO", then may proceed with Cephalosporin use.   Marland Kitchen Propoxyphene N-Acetaminophen Itching  . Rofecoxib Other (See Comments)    Unknown    . Rosuvastatin Other (See Comments)    cramps  . Statins Itching and Other (See Comments)    Sleeplessness  . Sulfa Antibiotics Rash    Patient Measurements: Height: 5\' 3"  (160 cm) Weight: 182 lb 8.7 oz (82.8 kg) IBW/kg (Calculated) : 52.4 Heparin Dosing Weight: 70kg  Vital Signs: Temp: 97.6 F (36.4 C) (12/01 2308) Temp Source: Axillary (12/01 2308) BP: 111/77 (12/02 0000) Pulse Rate: 72 (12/02 0000)  Labs:  Recent Labs  05/14/16 0909  05/14/16 1514 05/14/16 2134  05/15/16 0248 05/15/16 0926 05/15/16 1117  05/16/16 0251 05/16/16 0918 05/16/16 1846 05/17/16 0217  HGB 12.8  --   --   --   --   --   --  11.6*  --  11.4*  --   --  11.1*  HCT 40.3  --   --   --   --   --   --  36.6  --  35.0*  --   --  35.2*  PLT 342  --   --   --   --   --   --  293  --  276  --   --  349  APTT  --   --  39*  --   --   --   --   --   --   --   --   --   --   HEPARINUNFRC  --   --   --   --   < >  --  0.30  --   < > 0.34 0.40  --  0.19*  CREATININE  1.64*  --   --   --   --  1.34*  --   --   --   --  1.76*  --   --   CKTOTAL  --   < >  --   --   --  734* 578*  --   < > 147 111 83 68  TROPONINI  --   < >  --  9.73*  --  8.24* 7.76*  --   --   --   --   --   --   < > = values in this interval not displayed.  Estimated Creatinine Clearance: 26.9 mL/min (by C-G formula based on SCr of 1.76 mg/dL (H)).  Assessment: 28 yoF on heparin for NSTEMI.   Heparin level down to subtherapeutic (0.19) on 1400 units/hr. CBC stable, no bleeding noted. No issues noted per RN. Planning cath once respiratory issues are stabilized.   Goal of  Therapy:  Heparin level 0.3-0.7 units/ml Monitor platelets by anticoagulation protocol: Yes   Plan:  Increase heparin to 1550 units/hr  Will f/u 8 hr heparin level  Sherlon Handing, PharmD, BCPS Clinical pharmacist, pager [phone] 05/17/2016 3:32 AM

## 2016-05-17 NOTE — Progress Notes (Addendum)
PROGRESS NOTE    Sydney Phillips  [id number] DOB: 1935/07/19 DOA: 05/14/2016 PCP: Laurey Morale, MD   Brief Narrative:  80 y.o. WF PMHx Depression with Anxiety,Fibromyalgia, HTN, MI, LBBB, CAD, Chronic Systolic and Diastolic CHF s/p AICD, carotid artery disease s/p left CEA, left subclavian artery stenosis s/p stent, PAD s/p right fem-pop, COPD on 2 L O2 at home, OSA, hyperlipidemia. Chronic pain syndrome, Diabetes mellitus without complication   Patient presented to the ED after being found down by her son for an unknown amount of time. She has had about 3 falls recently thought to be secondary to medications. She is not sure of why she fell. She reports some associated right sided chest wall pain.    Subjective: 12/2   A/O 4, states has O2 at home but does not use it except when she feels like she short of breath. Continues to smoke.  States does not have CPAP machine for her OSA.   Marland Kitchen   Assessment & Plan:   Active Problems:   Essential hypertension   Coronary atherosclerosis   Hypokalemia   NSTEMI (non-ST elevated myocardial infarction) (HCC)   COPD exacerbation (HCC)   Chronic combined systolic and diastolic CHF (congestive heart failure) (HCC)   Encephalopathy   CAD in native artery   Acute renal failure with acute tubular necrosis superimposed on stage 3 chronic kidney disease (HCC)   Somnolence   OSA and COPD overlap syndrome (HCC)   Cardiomyopathy, ischemic   Ischemic mitral valve regurgitation   Pulmonary hypertension   Controlled diabetes mellitus type 2 with complications (HCC)  Altered mental status -Likely multifactorial to include COPD exacerbation, NSTEMI, overuse of Narcotic/Benzodiazepine, noncompliance. -Resolved  Acute on Chronic hypoxic respiratory failure -DuoNeb QID -Albuterol PRN -Mucinex DM BID -Flutter valve -Physiotherapy vest QID -Prednisone 50 mg daily -Titrate O2 to maintain SPO2 > 88% -Complete 5 day course  antibiotics -Respiratory virus panel negative, sputum pending,urine negative  OSA and COPD overlap syndrome -CPAP or BiPAP per respiratory: Maintain SPO2> 89%  NSTEMI -Cardiology plans on catheterization when patient more stable. However does not believe true ACS. Believe progression of CAD   Chronic Systolic and Diastolic CHF/Ischemic Cardiomyopathy -S/P AICD -Cardiology consulted believes patient requires catheterization prior to discharge. However believes current crisis not secondary to acute cardiac issue. Per cardiology last catheterization significant stenosis in LAD and RCA vessels. -Strict in and out since admission + 1 L -Daily weight Filed Weights   05/15/16 0500 05/16/16 0400 05/17/16 0402  Weight: 83.2 kg (183 lb 6.8 oz) 82.8 kg (182 lb 8.7 oz) 82.7 kg (182 lb 5.1 oz)  -Followed by cardiology outpatient. Significant CAD history. History of EF of 20% in 2009. Last EF of 40-45% with diffuse hypokinesis and grade one diastolic dysfunction in [id number].  -Bisoprolol 10mg  daily -12/2 decrease Lasix 40 mg  Daily secondary to increasing Cr  Mitral valve regurgitation -Per cardiology has worsened since September 2017 -See NSTEMI   Pulmonary hypertension -See CHF  CAD native artery -see CHF  Acute on CKD stage III(baseline Cr 1. Weight to 1.32) Lab Results  Component Value Date   CREATININE 1.84 (H) 05/17/2016   CREATININE 1.76 (H) 05/16/2016   CREATININE 1.34 (H) 05/15/2016  -Appears patient may be euvolemic and her new baseline Cr is at a higher set level. Decrease Lasix to 40 mg daily  Hypokalemia -Potassium goal> 4 -K-Dur 50 mEq  Hypomagnesemia -Magnesium goal> 2  Diabetes type 2 controlled with complication -[id number] Hemoglobin A1c= 6.9 -Lantus 5  units daily -Moderate SSI  HLD -Lipid panel: Not within ADA guidelines -Lipitor 40 mg daily:Tolerating W/O side effects     DVT prophylaxis: Heparin gtt Code Status: Full Family Communication:  None Disposition Plan: ??   Consultants:  [facility]   Procedures/Significant Events:  11/30 Echocardiogram: LVEF= 25% to 30%. Diffuse hypokinesis. - (grade 2 diastolic dysfunction).  - Ventricular septum: The contour showed diastolic flattening and   systolic flattening. -- Mitral valve: moderate to severe regurgitation. - Left atrium: moderately dilated. - Tricuspid valve: moderate regurgitation.- Pulmonary arteries:  PA peak pressure: 52 mm Hg (S).    VENTILATOR SETTINGS:     Cultures 11/30 sputum pending 11/30 blood NGTD 11/30 respiratory virus panel negative 11/30 urine negative 11/30 urine strep pneumo negative/Legionella pending   Antimicrobials: Levofloxacin 11/30>>   Devices    LINES / TUBES:      Continuous Infusions: . heparin 1,550 Units/hr (05/17/16 0340)     Objective: Vitals:   05/16/16 2357 05/17/16 0000 05/17/16 0402 05/17/16 0740  BP:  111/77 116/67 (!) 115/99  Pulse:  72 69 63  Resp:  (!) 24 (!) 23 (!) 23  Temp:   97.5 F (36.4 C) 97.6 F (36.4 C)  TempSrc:   Oral Axillary  SpO2: 99% 98% 99% 91%  Weight:   82.7 kg (182 lb 5.1 oz)   Height:        Intake/Output Summary (Last 24 hours) at 05/17/16 0856 Last data filed at 05/17/16 0631  Gross per 24 hour  Intake            445.5 ml  Output              500 ml  Net            -54.5 ml   Filed Weights   05/15/16 0500 05/16/16 0400 05/17/16 0402  Weight: 83.2 kg (183 lb 6.8 oz) 82.8 kg (182 lb 8.7 oz) 82.7 kg (182 lb 5.1 oz)    Examination:  General: A/O 4, positive acute on chronic respiratory distress Eyes: negative scleral hemorrhage, negative anisocoria, negative icterus ENT: Negative Runny nose, negative gingival bleeding, Neck:  Negative scars, masses, torticollis, lymphadenopathy, JVD Lungs: negative crackles, negative rhonchi, diminished bibasilar breath sounds  Rt>> Lt  Cardiovascular: Regular rhythm and rate without murmur gallop or rub normal S1 and  S2 Abdomen: Morbidly obese, negative abdominal pain, nondistended, positive soft, bowel sounds, no rebound, no ascites, no appreciable mass Extremities: No significant cyanosis, clubbing, or edema bilateral lower extremities Skin: Negative rashes, lesions, ulcers Psychiatric:  Unable to fully assess Central nervous system:  Cranial nerves II through XII intact, tongue/uvula midline, all extremities muscle strength 5/5, sensation intact throughout, negative dysarthria, negative expressive aphasia, negative receptive aphasia    Data Reviewed: Care during the described time interval was provided by me .  I have reviewed this patient's available data, including medical history, events of note, physical examination, and all test results as part of my evaluation. I have personally reviewed and interpreted all radiology studies.  CBC:  Recent Labs Lab 05/14/16 0909 05/15/16 1117 05/16/16 0251 05/17/16 0217  WBC 18.1* 20.8* 17.9* 27.7*  NEUTROABS  --  17.9*  --   --   HGB 12.8 11.6* 11.4* 11.1*  HCT 40.3 36.6 35.0* 35.2*  MCV 96.4 95.6 95.6 95.4  PLT 342 293 276 0000000   Basic Metabolic Panel:  Recent Labs Lab 05/14/16 0909 05/14/16 1208 05/15/16 0248 05/15/16 1927 05/16/16 0918  NA 135  --  137  --  140  K 2.6*  --  3.3* 3.9 4.1  CL 95*  --  100*  --  103  CO2 28  --  26  --  25  GLUCOSE 174*  --  172*  --  185*  BUN 24*  --  20  --  38*  CREATININE 1.64*  --  1.34*  --  1.76*  CALCIUM 8.6*  --  8.6*  --  9.3  MG  --  1.9 1.7 2.3 2.4   GFR: Estimated Creatinine Clearance: 26.8 mL/min (by C-G formula based on SCr of 1.76 mg/dL (H)). Liver Function Tests: No results for input(s): AST, ALT, ALKPHOS, BILITOT, PROT, ALBUMIN in the last 168 hours. No results for input(s): LIPASE, AMYLASE in the last 168 hours. No results for input(s): AMMONIA in the last 168 hours. Coagulation Profile: No results for input(s): INR, PROTIME in the last 168 hours. Cardiac Enzymes:  Recent  Labs Lab 05/14/16 1208 05/14/16 2134 05/15/16 0248 05/15/16 0926 05/15/16 1927 05/16/16 0251 05/16/16 0918 05/16/16 1846 05/17/16 0217  CKTOTAL 1,073*  --  734* 578* 297* 147 111 83 68  TROPONINI 11.34* 9.73* 8.24* 7.76*  --   --   --   --   --    BNP (last 3 results) No results for input(s): PROBNP in the last 8760 hours. HbA1C:  Recent Labs  05/14/16 2134 05/15/16 0248  HGBA1C 6.9* 6.9*   CBG:  Recent Labs Lab 05/16/16 1634 05/16/16 2158 05/17/16 0051 05/17/16 0339 05/17/16 0738  GLUCAP 207* 216* 152* 134* 118*   Lipid Profile:  Recent Labs  05/15/16 0248  CHOL 193  HDL 25*  LDLCALC 145*  TRIG 116  CHOLHDL 7.7   Thyroid Function Tests:  Recent Labs  05/14/16 2134  TSH 1.013   Anemia Panel: No results for input(s): VITAMINB12, FOLATE, FERRITIN, TIBC, IRON, RETICCTPCT in the last 72 hours. Urine analysis:    Component Value Date/Time   COLORURINE YELLOW 05/14/2016 Agency 05/14/2016 1208   LABSPEC 1.018 05/14/2016 1208   PHURINE 5.0 05/14/2016 1208   GLUCOSEU NEGATIVE 05/14/2016 1208   HGBUR NEGATIVE 05/14/2016 1208   HGBUR negative 01/01/2010 1353   BILIRUBINUR NEGATIVE 05/14/2016 1208   BILIRUBINUR n 05/30/2015 1542   KETONESUR NEGATIVE 05/14/2016 1208   PROTEINUR 30 (A) 05/14/2016 1208   UROBILINOGEN 0.2 05/30/2015 1542   UROBILINOGEN 1.0 11/03/2014 1442   NITRITE NEGATIVE 05/14/2016 1208   LEUKOCYTESUR NEGATIVE 05/14/2016 1208   Sepsis Labs: @LABRCNTIP (procalcitonin:4,lacticidven:4)  ) Recent Results (from the past 240 hour(s))  MRSA PCR Screening     Status: None   Collection Time: 05/15/16 12:34 AM  Result Value Ref Range Status   MRSA by PCR NEGATIVE NEGATIVE Final    Comment:        The GeneXpert MRSA Assay (FDA approved for NASAL specimens only), is one component of a comprehensive MRSA colonization surveillance program. It is not intended to diagnose MRSA infection nor to guide or monitor treatment  for MRSA infections.   Respiratory Panel by PCR     Status: None   Collection Time: 05/15/16  6:03 AM  Result Value Ref Range Status   Adenovirus NOT DETECTED NOT DETECTED Final   Coronavirus 229E NOT DETECTED NOT DETECTED Final   Coronavirus HKU1 NOT DETECTED NOT DETECTED Final   Coronavirus NL63 NOT DETECTED NOT DETECTED Final   Coronavirus OC43 NOT DETECTED NOT DETECTED Final   Metapneumovirus NOT DETECTED NOT DETECTED Final  Rhinovirus / Enterovirus NOT DETECTED NOT DETECTED Final   Influenza A NOT DETECTED NOT DETECTED Final   Influenza B NOT DETECTED NOT DETECTED Final   Parainfluenza Virus 1 NOT DETECTED NOT DETECTED Final   Parainfluenza Virus 2 NOT DETECTED NOT DETECTED Final   Parainfluenza Virus 3 NOT DETECTED NOT DETECTED Final   Parainfluenza Virus 4 NOT DETECTED NOT DETECTED Final   Respiratory Syncytial Virus NOT DETECTED NOT DETECTED Final   Bordetella pertussis NOT DETECTED NOT DETECTED Final   Chlamydophila pneumoniae NOT DETECTED NOT DETECTED Final   Mycoplasma pneumoniae NOT DETECTED NOT DETECTED Final  Culture, blood (routine x 2)     Status: None (Preliminary result)   Collection Time: 05/15/16 11:20 AM  Result Value Ref Range Status   Specimen Description BLOOD RIGHT ANTECUBITAL  Final   Special Requests BOTTLES DRAWN AEROBIC AND ANAEROBIC  2CC  Final   Culture NO GROWTH < 24 HOURS  Final   Report Status PENDING  Incomplete  Culture, blood (routine x 2)     Status: None (Preliminary result)   Collection Time: 05/15/16 11:31 AM  Result Value Ref Range Status   Specimen Description BLOOD RIGHT HAND  Final   Special Requests BOTTLES DRAWN AEROBIC ONLY  5CC  Final   Culture NO GROWTH < 24 HOURS  Final   Report Status PENDING  Incomplete  Urine culture     Status: None   Collection Time: 05/15/16 12:30 PM  Result Value Ref Range Status   Specimen Description URINE, CATHETERIZED  Final   Special Requests NONE  Final   Culture NO GROWTH  Final   Report  Status 05/16/2016 FINAL  Final  Culture, respiratory (NON-Expectorated)     Status: None (Preliminary result)   Collection Time: 05/16/16  6:03 AM  Result Value Ref Range Status   Specimen Description TRACHEAL ASPIRATE  Final   Special Requests NONE  Final   Gram Stain   Final    RARE WBC PRESENT,BOTH PMN AND MONONUCLEAR FEW SQUAMOUS EPITHELIAL CELLS PRESENT RARE GRAM POSITIVE COCCI IN PAIRS RARE GRAM NEGATIVE COCCOBACILLI RARE GRAM POSITIVE RODS    Culture PENDING  Incomplete   Report Status PENDING  Incomplete         Radiology Studies: No results found.      Scheduled Meds: . allopurinol  300 mg Oral Daily  . aspirin  324 mg Oral Once  . atorvastatin  40 mg Oral q1800  . bisoprolol  10 mg Oral Daily  . chlorhexidine  15 mL Mouth Rinse BID  . dextromethorphan-guaiFENesin  1 tablet Oral BID  . DULoxetine  60 mg Oral BID  . furosemide  40 mg Oral BID  . insulin aspart  0-15 Units Subcutaneous Q4H  . insulin glargine  5 Units Subcutaneous Daily  . ipratropium-albuterol  3 mL Nebulization Q6H  . lactated ringers  1,000 mL Intravenous Once  . levofloxacin (LEVAQUIN) IV  750 mg Intravenous Q48H  . mouth rinse  15 mL Mouth Rinse q12n4p  . predniSONE  50 mg Oral Q breakfast   Continuous Infusions: . heparin 1,550 Units/hr (05/17/16 0340)     LOS: 3 days    Time spent: 40 minutes    Amen Dargis, Geraldo Docker, MD [facility] Pager [phone]   If 7PM-7AM, please contact night-coverage [email or web address] Password [facility] 05/17/2016, 8:56 AM

## 2016-05-17 NOTE — Progress Notes (Signed)
SUBJECTIVE:  Dyspnea is better. Slightly positive since admission. LVEF 25-30%. On lasix 40 mg po BID. Weight stable at 182.   BP (!) 93/54 (BP Location: Right Arm)   Pulse 67   Temp 97.3 F (36.3 C) (Oral)   Resp (!) 25   Ht 5\' 3"  (1.6 m)   Wt 182 lb 5.1 oz (82.7 kg)   SpO2 95%   BMI 32.30 kg/m   Intake/Output Summary (Last 24 hours) at 05/17/16 1240 Last data filed at 05/17/16 1100  Gross per 24 hour  Intake            629.5 ml  Output              500 ml  Net            129.5 ml    PHYSICAL EXAM General: Well developed, well nourished, somnolent.  Neck: No JVD. No masses noted.  Lungs: Coarse BS bilaterally with wheezing, poor air movement Heart: RRR Abdomen: Bowel sounds are present. Soft, non-tender.  Extremities: No lower extremity edema.   LABS: Basic Metabolic Panel:  Recent Labs  05/16/16 0918 05/17/16 0910  NA 140 139  K 4.1 3.6  CL 103 103  CO2 25 24  GLUCOSE 185* 108*  BUN 38* 62*  CREATININE 1.76* 1.84*  CALCIUM 9.3 8.8*  MG 2.4 2.3   CBC:  Recent Labs  05/15/16 1117 05/16/16 0251 05/17/16 0217  WBC 20.8* 17.9* 27.7*  NEUTROABS 17.9*  --   --   HGB 11.6* 11.4* 11.1*  HCT 36.6 35.0* 35.2*  MCV 95.6 95.6 95.4  PLT 293 276 349   Cardiac Enzymes:  Recent Labs  05/14/16 2134  05/15/16 0248 05/15/16 0926  05/16/16 1846 05/17/16 0217 05/17/16 0910  CKTOTAL  --   < > 734* 578*  < > 83 68 61  TROPONINI 9.73*  --  8.24* 7.76*  --   --   --   --   < > = values in this interval not displayed. Fasting Lipid Panel:  Recent Labs  05/15/16 0248  CHOL 193  HDL 25*  LDLCALC 145*  TRIG 116  CHOLHDL 7.7    Current Meds: . allopurinol  300 mg Oral Daily  . aspirin  324 mg Oral Once  . atorvastatin  40 mg Oral q1800  . bisoprolol  10 mg Oral Daily  . chlorhexidine  15 mL Mouth Rinse BID  . dextromethorphan-guaiFENesin  1 tablet Oral BID  . DULoxetine  60 mg Oral BID  . furosemide  40 mg Oral BID  . insulin aspart  0-15 Units  Subcutaneous Q4H  . insulin glargine  5 Units Subcutaneous Daily  . ipratropium-albuterol  3 mL Nebulization Q6H  . lactated ringers  1,000 mL Intravenous Once  . levofloxacin (LEVAQUIN) IV  750 mg Intravenous Q48H  . mouth rinse  15 mL Mouth Rinse q12n4p  . predniSONE  50 mg Oral Q breakfast   Echo 05/15/16: Left ventricle: The cavity size was normal. Systolic function was   severely reduced. The estimated ejection fraction was in the   range of 25% to 30%. Diffuse hypokinesis. Features are consistent   with a pseudonormal left ventricular filling pattern, with   concomitant abnormal relaxation and increased filling pressure   (grade 2 diastolic dysfunction). Doppler parameters are   consistent with elevated ventricular end-diastolic filling   pressure. - Ventricular septum: The contour showed diastolic flattening and   systolic flattening. - Aortic  valve: Trileaflet; mildly thickened, mildly calcified   leaflets. There was no regurgitation. - Aortic root: The aortic root was normal in size. - Mitral valve: Structurally normal valve. There was moderate to   severe regurgitation. - Left atrium: The atrium was moderately dilated. - Right ventricle: The cavity size was normal. Wall thickness was   normal. Systolic function was normal. - Right atrium: Pacer wire or catheter noted in right atrium. - Tricuspid valve: There was moderate regurgitation. - Pulmonic valve: Transvalvular velocity was within the normal   range. There was no evidence for stenosis. - Pulmonary arteries: Systolic pressure was moderately to severely   increased. PA peak pressure: 52 mm Hg (S). - Pericardium, extracardiac: There was no pericardial effusion. Impressions:  - When compared to the prior study from 02/27/2016 LVEF has further   decreased, now 25-30% with diffuse hypokinesis. Elevated filling   pressures.   Mitral regurgitation is at least moderate to severe.   Moderate to severe pulmonary  hypertension, RVSP 52 mmHg.  ASSESSMENT AND PLAN:  1. Acute respiratory failure/COPD exacerbation: Her respiratory failure is felt to be due to a COPD exacerbation. She has had a cough productive of yellow sputum, weakness and worsening dyspnea for several weeks. On arrival she was profoundly hypoxic. She has no signs of volume overload or CHF. Weight is stable at baseline. No LE edema. Her troponin is elevated after a prolonged down time at home (CK also over 1000). The troponin elevation likely represents demand ischemia in a patient with known moderately severe CAD (75% stenosis in LAD and 75% stenosis in RCA by cath in 2009). Consider R/LHC on Monday if creatinine is stable, however, it is noted to be rising somewhat.  2. CAD/Elevated troponin: I do not think her troponin elevation represents true ACS.(see above). Trending down. She is known to have moderately severe CAD by cath in 2009 with ongoing tobacco abuse. She probably has progression of CAD. Ischemic eval before discharge after COPD exacerbation is resolved. Continue IV heparin for now. Beta blocker changed to bisoprolol.   3. HTN: BP stable.   4. Cardiomyopathy: ICD in place. Echo Sept 2017 with LVEF=40-45%. Limited echo this admission with LVEF=25-30%. Medical management for now. Will likely need cardiac cath before discharge.   5. Mitral regurgitation: Moderate to severe. This has changed since her echo in September 2017. ? Ischemic MR. As above, will need cath next week when resp issues are stable if renal function allows.   Sydney Casino, MD, [facility] Attending Cardiologist [place]  12/2/201712:40 PM

## 2016-05-17 NOTE — Progress Notes (Signed)
[place] for Heparin Indication: chest pain/ACS  Allergies  Allergen Reactions  . Potassium-Containing Compounds Other (See Comments)    Causes severe constipation  . Azithromycin Rash  . Codeine Itching  . Darvon Itching  . Erythromycin Rash  . Meloxicam Other (See Comments)    Unknown    . Norco [Hydrocodone-Acetaminophen] Itching  . Penicillins Rash    Has patient had a PCN reaction causing immediate rash, facial/tongue/throat swelling, SOB or lightheadedness with hypotension: unknown Has patient had a PCN reaction causing severe rash involving mucus membranes or skin necrosis: no Has patient had a PCN reaction that required hospitalization: unknown Has patient had a PCN reaction occurring within the last 10 years: no If all of the above answers are "NO", then may proceed with Cephalosporin use.   Marland Kitchen Propoxyphene N-Acetaminophen Itching  . Rofecoxib Other (See Comments)    Unknown    . Rosuvastatin Other (See Comments)    cramps  . Statins Itching and Other (See Comments)    Sleeplessness  . Sulfa Antibiotics Rash    Patient Measurements: Height: 5\' 3"  (160 cm) Weight: 182 lb 5.1 oz (82.7 kg) IBW/kg (Calculated) : 52.4 Heparin Dosing Weight: 70kg  Vital Signs: Temp: 97.3 F (36.3 C) (12/02 1236) Temp Source: Oral (12/02 1236) BP: 93/54 (12/02 1236) Pulse Rate: 67 (12/02 1236)  Labs:  Recent Labs  05/14/16 1514 05/14/16 2134  05/15/16 0248 05/15/16 0926  05/15/16 1117  05/16/16 0251 05/16/16 0918 05/16/16 1846 05/17/16 0217 05/17/16 0910  HGB  --   --   --   --   --   < > 11.6*  --  11.4*  --   --  11.1*  --   HCT  --   --   --   --   --   --  36.6  --  35.0*  --   --  35.2*  --   PLT  --   --   --   --   --   --  293  --  276  --   --  349  --   APTT 39*  --   --   --   --   --   --   --   --   --   --   --   --   HEPARINUNFRC  --   --   < >  --  0.30  --   --   < > 0.34 0.40  --  0.19* 0.50  CREATININE  --    --   --  1.34*  --   --   --   --   --  1.76*  --   --  1.84*  CKTOTAL  --   --   < > 734* 578*  --   --   < > 147 111 83 68 61  TROPONINI  --  9.73*  --  8.24* 7.76*  --   --   --   --   --   --   --   --   < > = values in this interval not displayed.  Estimated Creatinine Clearance: 25.7 mL/min (by C-G formula based on SCr of 1.84 mg/dL (H)).  Assessment: Sydney Phillips on heparin for NSTEMI.   Heparin level back up to goal at 0.50 after rate increase this am. CBC stable, no bleeding noted. No issues noted per RN. Planning cath once respiratory issues  are stabilized.   Goal of Therapy:  Heparin level 0.3-0.7 units/ml Monitor platelets by anticoagulation protocol: Yes   Plan:  Continue heparin at 1550 units/hr  Will f/u 8 hr heparin level to confirm   Amenah Tucci K. Velva Harman, PharmD, BCPS, CPP Clinical Pharmacist Pager: [phone] Phone: [phone] 05/17/2016 1:12 PM

## 2016-05-17 NOTE — Progress Notes (Signed)
Pt refused CPT  To sore from yesterday

## 2016-05-17 NOTE — Progress Notes (Signed)
[place] for Heparin Indication: chest pain/ACS  Allergies  Allergen Reactions  . Potassium-Containing Compounds Other (See Comments)    Causes severe constipation  . Azithromycin Rash  . Codeine Itching  . Darvon Itching  . Erythromycin Rash  . Meloxicam Other (See Comments)    Unknown    . Norco [Hydrocodone-Acetaminophen] Itching  . Penicillins Rash    Has patient had a PCN reaction causing immediate rash, facial/tongue/throat swelling, SOB or lightheadedness with hypotension: unknown Has patient had a PCN reaction causing severe rash involving mucus membranes or skin necrosis: no Has patient had a PCN reaction that required hospitalization: unknown Has patient had a PCN reaction occurring within the last 10 years: no If all of the above answers are "NO", then may proceed with Cephalosporin use.   Marland Kitchen Propoxyphene N-Acetaminophen Itching  . Rofecoxib Other (See Comments)    Unknown    . Rosuvastatin Other (See Comments)    cramps  . Statins Itching and Other (See Comments)    Sleeplessness  . Sulfa Antibiotics Rash    Patient Measurements: Height: 5\' 3"  (160 cm) Weight: 182 lb 5.1 oz (82.7 kg) IBW/kg (Calculated) : 52.4 Heparin Dosing Weight: 70kg  Vital Signs: Temp: 97.4 F (36.3 C) (12/02 1557) Temp Source: Oral (12/02 1557) BP: 113/65 (12/02 1557) Pulse Rate: 67 (12/02 1557)  Labs:  Recent Labs  05/14/16 2134  05/15/16 0248 05/15/16 0926  05/15/16 1117  05/16/16 0251 05/16/16 0918  05/17/16 0217 05/17/16 0910 05/17/16 1626  HGB  --   --   --   --   < > 11.6*  --  11.4*  --   --  11.1*  --   --   HCT  --   --   --   --   --  36.6  --  35.0*  --   --  35.2*  --   --   PLT  --   --   --   --   --  293  --  276  --   --  349  --   --   HEPARINUNFRC  --   < >  --  0.30  --   --   < > 0.34 0.40  --  0.19* 0.50 0.54  CREATININE  --   --  1.34*  --   --   --   --   --  1.76*  --   --  1.84*  --   CKTOTAL  --   < >  734* 578*  --   --   < > 147 111  < > 68 61 61  TROPONINI 9.73*  --  8.24* 7.76*  --   --   --   --   --   --   --   --   --   < > = values in this interval not displayed.  Estimated Creatinine Clearance: 25.7 mL/min (by C-G formula based on SCr of 1.84 mg/dL (H)).  Assessment: 15 yoF on heparin for NSTEMI.   Heparin level now therapeutic x 2 (0.54) on 1550 units/h. CBC stable, no bleeding noted. Planning cath once respiratory issues are stabilized.   Goal of Therapy:  Heparin level 0.3-0.7 units/ml Monitor platelets by anticoagulation protocol: Yes   Plan:  Continue heparin at 1550 units/hr  Daily heparin level/CBC Monitor for s/sx bleeding F/u Cards plans   Elicia Lamp, PharmD, BCPS Clinical Pharmacist 05/17/2016 5:31 PM

## 2016-05-17 NOTE — Progress Notes (Signed)
Pt requested to be removed from BiPAP. RT assessed pt, gave treatment and placed pt on 3L [place].  CPT not performed because pt complained of a headache and rib pain. After treatment pt is comfortable without any distress.  RT will continue to monitor pt.

## 2016-05-18 LAB — CULTURE, RESPIRATORY W GRAM STAIN: Culture: NORMAL

## 2016-05-18 LAB — HEPARIN LEVEL (UNFRACTIONATED): HEPARIN UNFRACTIONATED: 0.64 [IU]/mL (ref 0.30–0.70)

## 2016-05-18 LAB — BASIC METABOLIC PANEL
ANION GAP: 12 (ref 5–15)
BUN: 55 mg/dL — ABNORMAL HIGH (ref 6–20)
CALCIUM: 8.8 mg/dL — AB (ref 8.9–10.3)
CO2: 23 mmol/L (ref 22–32)
Chloride: 104 mmol/L (ref 101–111)
Creatinine, Ser: 1.7 mg/dL — ABNORMAL HIGH (ref 0.44–1.00)
GFR calc Af Amer: 32 mL/min — ABNORMAL LOW (ref >60)
GFR calc non Af Amer: 28 mL/min — ABNORMAL LOW (ref >60)
GLUCOSE: 218 mg/dL — AB (ref 65–99)
Potassium: 4.2 mmol/L (ref 3.5–5.1)
Sodium: 139 mmol/L (ref 135–145)

## 2016-05-18 LAB — CULTURE, RESPIRATORY

## 2016-05-18 LAB — CBC
HCT: 36.5 % (ref 36.0–46.0)
Hemoglobin: 11.6 g/dL — ABNORMAL LOW (ref 12.0–15.0)
MCH: 30.4 pg (ref 26.0–34.0)
MCHC: 31.8 g/dL (ref 30.0–36.0)
MCV: 95.8 fL (ref 78.0–100.0)
Platelets: 326 10*3/uL (ref 150–400)
RBC: 3.81 MIL/uL — AB (ref 3.87–5.11)
RDW: 15.1 % (ref 11.5–15.5)
WBC: 19.9 10*3/uL — AB (ref 4.0–10.5)

## 2016-05-18 LAB — MAGNESIUM: Magnesium: 2 mg/dL (ref 1.7–2.4)

## 2016-05-18 LAB — CK
CK TOTAL: 51 U/L (ref 38–234)
Total CK: 39 U/L (ref 38–234)
Total CK: 29 U/L — ABNORMAL LOW (ref 38–234)

## 2016-05-18 LAB — GLUCOSE, CAPILLARY
GLUCOSE-CAPILLARY: 258 mg/dL — AB (ref 65–99)
GLUCOSE-CAPILLARY: 296 mg/dL — AB (ref 65–99)
Glucose-Capillary: 170 mg/dL — ABNORMAL HIGH (ref 65–99)
Glucose-Capillary: 245 mg/dL — ABNORMAL HIGH (ref 65–99)
Glucose-Capillary: 250 mg/dL — ABNORMAL HIGH (ref 65–99)

## 2016-05-18 MED ORDER — SODIUM CHLORIDE 0.9% FLUSH
10.0000 mL | INTRAVENOUS | Status: DC | PRN
  Administered 2016-05-21 – 2016-05-23 (×3): 10 mL
  Filled 2016-05-18 (×3): qty 40

## 2016-05-18 MED ORDER — FUROSEMIDE 40 MG PO TABS
40.0000 mg | ORAL_TABLET | Freq: Two times a day (BID) | ORAL | Status: DC
  Administered 2016-05-18 (×2): 40 mg via ORAL
  Filled 2016-05-18 (×3): qty 1

## 2016-05-18 MED ORDER — INSULIN GLARGINE 100 UNIT/ML ~~LOC~~ SOLN
8.0000 [IU] | Freq: Once | SUBCUTANEOUS | Status: AC
  Administered 2016-05-18: 8 [IU] via SUBCUTANEOUS
  Filled 2016-05-18: qty 0.08

## 2016-05-18 MED ORDER — INSULIN GLARGINE 100 UNIT/ML ~~LOC~~ SOLN
12.0000 [IU] | Freq: Every day | SUBCUTANEOUS | Status: DC
  Administered 2016-05-19 – 2016-05-23 (×5): 12 [IU] via SUBCUTANEOUS
  Filled 2016-05-18 (×5): qty 0.12

## 2016-05-18 MED ORDER — INSULIN ASPART 100 UNIT/ML ~~LOC~~ SOLN
0.0000 [IU] | SUBCUTANEOUS | Status: DC
  Administered 2016-05-18: 7 [IU] via SUBCUTANEOUS
  Administered 2016-05-19 (×2): 3 [IU] via SUBCUTANEOUS

## 2016-05-18 NOTE — Progress Notes (Signed)
[place] for Heparin Indication: chest pain/ACS  Allergies  Allergen Reactions  . Potassium-Containing Compounds Other (See Comments)    Causes severe constipation  . Azithromycin Rash  . Codeine Itching  . Darvon Itching  . Erythromycin Rash  . Meloxicam Other (See Comments)    Unknown    . Norco [Hydrocodone-Acetaminophen] Itching  . Penicillins Rash    Has patient had a PCN reaction causing immediate rash, facial/tongue/throat swelling, SOB or lightheadedness with hypotension: unknown Has patient had a PCN reaction causing severe rash involving mucus membranes or skin necrosis: no Has patient had a PCN reaction that required hospitalization: unknown Has patient had a PCN reaction occurring within the last 10 years: no If all of the above answers are "NO", then may proceed with Cephalosporin use.   Marland Kitchen Propoxyphene N-Acetaminophen Itching  . Rofecoxib Other (See Comments)    Unknown    . Rosuvastatin Other (See Comments)    cramps  . Statins Itching and Other (See Comments)    Sleeplessness  . Sulfa Antibiotics Rash    Patient Measurements: Height: 5\' 3"  (160 cm) Weight: 184 lb 15.5 oz (83.9 kg) IBW/kg (Calculated) : 52.4 Heparin Dosing Weight: 70kg  Vital Signs: Temp: 97.5 F (36.4 C) (12/03 0722) Temp Source: Oral (12/03 0722) BP: 102/57 (12/03 0722) Pulse Rate: 64 (12/03 0722)  Labs:  Recent Labs  05/15/16 0926  05/16/16 0251 05/16/16 0918  05/17/16 0217 05/17/16 0910 05/17/16 1626 05/18/16 0213  HGB  --   < > 11.4*  --   --  11.1*  --   --  11.6*  HCT  --   < > 35.0*  --   --  35.2*  --   --  36.5  PLT  --   < > 276  --   --  349  --   --  326  HEPARINUNFRC 0.30  < > 0.34 0.40  --  0.19* 0.50 0.54 0.64  CREATININE  --   --   --  1.76*  --   --  1.84*  --   --   CKTOTAL 578*  < > 147 111  < > 68 61 61 51  TROPONINI 7.76*  --   --   --   --   --   --   --   --   < > = values in this interval not  displayed.  Estimated Creatinine Clearance: 25.9 mL/min (by C-G formula based on SCr of 1.84 mg/dL (H)).  Assessment: 45 yoF on heparin for NSTEMI.   Heparin level remains therapeutic at 0.64 on 1550 units/h. CBC stable, no bleeding noted. Planning cath once respiratory issues are stabilized.   Goal of Therapy:  Heparin level 0.3-0.7 units/ml Monitor platelets by anticoagulation protocol: Yes   Plan:  Continue heparin at 1550 units/hr  Daily heparin level/CBC Monitor for s/sx bleeding F/u plans for cath   [facility] K. Velva Harman, PharmD, BCPS, CPP Clinical Pharmacist Pager: [phone] Phone: [phone] 05/18/2016 7:46 AM

## 2016-05-18 NOTE — Progress Notes (Addendum)
SUBJECTIVE:  Net positive yesterday. LVEF 25-30%. On lasix 40 mg po BID. Weight now up 2 lbs. Creatinine continues to improve.  BP (!) 102/57 (BP Location: Right Arm)   Pulse 64   Temp 97.5 F (36.4 C) (Oral)   Resp (!) 22   Ht 5\' 3"  (1.6 m)   Wt 184 lb 15.5 oz (83.9 kg)   SpO2 95%   BMI 32.77 kg/m   Intake/Output Summary (Last 24 hours) at 05/18/16 0902 Last data filed at 05/18/16 0600  Gross per 24 hour  Intake             1362 ml  Output              500 ml  Net              862 ml    PHYSICAL EXAM General: Well developed, well nourished, somnolent.  Neck: No JVD. No masses noted.  Lungs: Coarse BS bilaterally with wheezing, poor air movement Heart: RRR Abdomen: Bowel sounds are present. Soft, non-tender.  Extremities: Trace lower extremity edema.   LABS: Basic Metabolic Panel:  Recent Labs  05/16/16 0918 05/17/16 0910  NA 140 139  K 4.1 3.6  CL 103 103  CO2 25 24  GLUCOSE 185* 108*  BUN 38* 62*  CREATININE 1.76* 1.84*  CALCIUM 9.3 8.8*  MG 2.4 2.3   CBC:  Recent Labs  05/15/16 1117  05/17/16 0217 05/18/16 0213  WBC 20.8*  < > 27.7* 19.9*  NEUTROABS 17.9*  --   --   --   HGB 11.6*  < > 11.1* 11.6*  HCT 36.6  < > 35.2* 36.5  MCV 95.6  < > 95.4 95.8  PLT 293  < > 349 326  < > = values in this interval not displayed. Cardiac Enzymes:  Recent Labs  05/15/16 0926  05/17/16 0910 05/17/16 1626 05/18/16 0213  CKTOTAL 578*  < > 61 61 51  TROPONINI 7.76*  --   --   --   --   < > = values in this interval not displayed. Fasting Lipid Panel: No results for input(s): CHOL, HDL, LDLCALC, TRIG, CHOLHDL, LDLDIRECT in the last 72 hours.  Current Meds: . allopurinol  300 mg Oral Daily  . aspirin  324 mg Oral Once  . atorvastatin  40 mg Oral q1800  . bisoprolol  10 mg Oral Daily  . chlorhexidine  15 mL Mouth Rinse BID  . dextromethorphan-guaiFENesin  1 tablet Oral BID  . DULoxetine  60 mg Oral BID  . furosemide  40 mg Oral Daily  . insulin  aspart  0-15 Units Subcutaneous Q4H  . insulin glargine  5 Units Subcutaneous Daily  . ipratropium-albuterol  3 mL Nebulization Q6H  . lactated ringers  1,000 mL Intravenous Once  . levofloxacin (LEVAQUIN) IV  750 mg Intravenous Q48H  . mouth rinse  15 mL Mouth Rinse q12n4p  . predniSONE  50 mg Oral Q breakfast   Echo 05/15/16: Left ventricle: The cavity size was normal. Systolic function was   severely reduced. The estimated ejection fraction was in the   range of 25% to 30%. Diffuse hypokinesis. Features are consistent   with a pseudonormal left ventricular filling pattern, with   concomitant abnormal relaxation and increased filling pressure   (grade 2 diastolic dysfunction). Doppler parameters are   consistent with elevated ventricular end-diastolic filling   pressure. - Ventricular septum: The contour showed diastolic flattening and  systolic flattening. - Aortic valve: Trileaflet; mildly thickened, mildly calcified   leaflets. There was no regurgitation. - Aortic root: The aortic root was normal in size. - Mitral valve: Structurally normal valve. There was moderate to   severe regurgitation. - Left atrium: The atrium was moderately dilated. - Right ventricle: The cavity size was normal. Wall thickness was   normal. Systolic function was normal. - Right atrium: Pacer wire or catheter noted in right atrium. - Tricuspid valve: There was moderate regurgitation. - Pulmonic valve: Transvalvular velocity was within the normal   range. There was no evidence for stenosis. - Pulmonary arteries: Systolic pressure was moderately to severely   increased. PA peak pressure: 52 mm Hg (S). - Pericardium, extracardiac: There was no pericardial effusion. Impressions:  - When compared to the prior study from 02/27/2016 LVEF has further   decreased, now 25-30% with diffuse hypokinesis. Elevated filling   pressures.   Mitral regurgitation is at least moderate to severe.   Moderate to severe  pulmonary hypertension, RVSP 52 mmHg.  ASSESSMENT AND PLAN:  1. Acute respiratory failure/COPD exacerbation: Her respiratory failure is felt to be due to a COPD exacerbation. She has had a cough productive of yellow sputum, weakness and worsening dyspnea for several weeks. On arrival she was profoundly hypoxic. She has no signs of volume overload or CHF. Weight is stable at baseline. No LE edema. Her troponin is elevated after a prolonged down time at home (CK also over 1000). The troponin elevation likely represents demand ischemia in a patient with known moderately severe CAD (75% stenosis in LAD and 75% stenosis in RCA by cath in 2009). Consider R/LHC next week if creatinine is stable, however, it is noted to be rising somewhat.  2. CAD/Elevated troponin: I do not think her troponin elevation represents true ACS.(see above). Trending down. She is known to have moderately severe CAD by cath in 2009 with ongoing tobacco abuse. She probably has progression of CAD. Ischemic eval before discharge after COPD exacerbation is resolved. Continue IV heparin for now - will need new IV access. Beta blocker changed to bisoprolol.   3. HTN: BP stable.   4. Cardiomyopathy: ICD in place. Echo Sept 2017 with LVEF=40-45%. Limited echo this admission with LVEF=25-30%. Medical management for now. Weight going up - increase lasix to 40 mg BID today. She does not currently have IV access. Will likely need cardiac cath before discharge.   5. Mitral regurgitation: Moderate to severe. This has changed since her echo in September 2017. ? Ischemic MR. As above, will need cath next week when resp issues are stable if renal function allows.   Sydney Casino, MD, [facility] Attending Cardiologist [place] [facility] Tova Vater  12/3/20179:02 AM

## 2016-05-18 NOTE — Progress Notes (Signed)
PROGRESS NOTE    Sydney Phillips  [id number] DOB: Sep 19, 1935 DOA: 05/14/2016 PCP: Laurey Morale, MD   Brief Narrative:  80 y.o. WF PMHx Depression with Anxiety,Fibromyalgia, HTN, MI, LBBB, CAD, Chronic Systolic and Diastolic CHF s/p AICD, carotid artery disease s/p left CEA, left subclavian artery stenosis s/p stent, PAD s/p right fem-pop, COPD on 2 L O2 at home, OSA, hyperlipidemia. Chronic pain syndrome, Diabetes mellitus without complication   Patient presented to the ED after being found down by her son for an unknown amount of time. She has had about 3 falls recently thought to be secondary to medications. She is not sure of why she fell. She reports some associated right sided chest wall pain.    Subjective: 12/3   A/O 4, states has O2 at home but does not use it except when she feels like she short of breath. Continues to smoke.  States does not have CPAP machine for her OSA.   Marland Kitchen   Assessment & Plan:   Active Problems:   Essential hypertension   Coronary atherosclerosis   Hypokalemia   NSTEMI (non-ST elevated myocardial infarction) (HCC)   COPD exacerbation (HCC)   Chronic combined systolic and diastolic CHF (congestive heart failure) (HCC)   Encephalopathy   CAD in native artery   Acute renal failure with acute tubular necrosis superimposed on stage 3 chronic kidney disease (HCC)   Somnolence   OSA and COPD overlap syndrome (HCC)   Cardiomyopathy, ischemic   Ischemic mitral valve regurgitation   Pulmonary hypertension   Controlled diabetes mellitus type 2 with complications ([place])   Fall at home  Altered mental status -Likely multifactorial to include COPD exacerbation, NSTEMI, overuse of Narcotic/Benzodiazepine, noncompliance. -Resolved  Acute on Chronic hypoxic respiratory failure -DuoNeb QID -Albuterol PRN -Mucinex DM BID -Flutter valve -Physiotherapy vest QID (refuses to use) -Prednisone 50 mg daily -Titrate O2 to maintain SPO2 > 88% -Complete 5  day course antibiotics -Respiratory virus panel negative, tracheal aspirate negative,urine negative  OSA and COPD overlap syndrome -CPAP or BiPAP per respiratory: Maintain SPO2> 89%  NSTEMI -Cardiology plans on catheterization when patient more stable.   Chronic Systolic and Diastolic CHF/Ischemic Cardiomyopathy -S/P AICD -Cardiology consulted believes patient requires catheterization prior to discharge.  Per cardiology last catheterization significant stenosis in LAD and RCA vessels. -Strict in and out since admission -783 ml -Daily weight Filed Weights   05/16/16 0400 05/17/16 0402 05/18/16 0300  Weight: 82.8 kg (182 lb 8.7 oz) 82.7 kg (182 lb 5.1 oz) 83.9 kg (184 lb 15.5 oz)  -Followed by cardiology outpatient. Significant CAD history. History of EF of 20% in 2009. Last EF of 40-45% with diffuse hypokinesis and grade one diastolic dysfunction in [id number].  -Bisoprolol 10mg  daily -12/3 Cards increased Lasix 40 mg BID  Mitral valve regurgitation -Per cardiology has worsened since September 2017 -See NSTEMI   Pulmonary hypertension -See CHF  CAD native artery -see CHF  Acute on CKD stage III(baseline Cr 1.0 to 1.32) Lab Results  Component Value Date   CREATININE 1.70 (H) 05/18/2016   CREATININE 1.84 (H) 05/17/2016   CREATININE 1.76 (H) 05/16/2016  -Appears patient may be euvolemic and her new baseline Cr is at a higher set level. Decrease Lasix to 40 mg daily  Hypokalemia -Potassium goal> 4  Hypomagnesemia -Magnesium goal> 2  Diabetes type 2 controlled with complication -[id number] Hemoglobin A1c= 6.9 -Increase Lantus 12 units daily -Resistant SSI  HLD -Lipid panel: Not within ADA guidelines -Lipitor 40 mg daily:Tolerating W/O side  effects. Thursday increased to 80 mg daily     DVT prophylaxis: Heparin gtt Code Status: Full Family Communication: None Disposition Plan: Per cardiology   Consultants:  [facility] Card[facility]   Procedures/Significant Events:    11/30 Echocardiogram: LVEF= 25% to 30%. Diffuse hypokinesis. - (grade 2 diastolic dysfunction).  - Ventricular septum: The contour showed diastolic flattening and   systolic flattening. -- Mitral valve: moderate to severe regurgitation. - Left atrium: moderately dilated. - Tricuspid valve: moderate regurgitation.- Pulmonary arteries:  PA peak pressure: 52 mm Hg (S).    VENTILATOR SETTINGS:     Cultures 11/30 blood NGTD 11/30 respiratory virus panel negative 11/30 urine negative 11/30 urine strep pneumo negative/Legionella pending 12/1 tracheal aspirate normal flora    Antimicrobials: Levofloxacin 11/30>>   Devices    LINES / TUBES:  Midline power wand? 12/3>>    Continuous Infusions: . heparin 1,550 Units/hr (05/18/16 0600)     Objective: Vitals:   05/18/16 0300 05/18/16 0722 05/18/16 0854 05/18/16 1127  BP: 122/74 (!) 102/57  (!) 119/59  Pulse: 71 64  67  Resp: (!) 22 (!) 22  20  Temp: 98.3 F (36.8 C) 97.5 F (36.4 C)  97.7 F (36.5 C)  TempSrc: Axillary Oral  Oral  SpO2: 99% 92% 95% 98%  Weight: 83.9 kg (184 lb 15.5 oz)     Height:        Intake/Output Summary (Last 24 hours) at 05/18/16 1130 Last data filed at 05/18/16 0600  Gross per 24 hour  Intake           1044.5 ml  Output              500 ml  Net            544.5 ml   Filed Weights   05/16/16 0400 05/17/16 0402 05/18/16 0300  Weight: 82.8 kg (182 lb 8.7 oz) 82.7 kg (182 lb 5.1 oz) 83.9 kg (184 lb 15.5 oz)    Examination:  General: A/O 4, positive acute on chronic respiratory distress Eyes: negative scleral hemorrhage, negative anisocoria, negative icterus ENT: Negative Runny nose, negative gingival bleeding, Neck:  Negative scars, masses, torticollis, lymphadenopathy, JVD Lungs: Clear to auscultation bilateral   Cardiovascular: Regular rhythm and rate without murmur gallop or rub normal S1 and S2 Abdomen: Morbidly obese, negative abdominal pain, nondistended, positive soft,  bowel sounds, no rebound, no ascites, no appreciable mass Extremities: No significant cyanosis, clubbing, or edema bilateral lower extremities Skin: Negative rashes, lesions, ulcers Psychiatric:  Unable to fully assess Central nervous system:  Cranial nerves II through XII intact, tongue/uvula midline, all extremities muscle strength 5/5, sensation intact throughout, negative dysarthria, negative expressive aphasia, negative receptive aphasia    Data Reviewed: Care during the described time interval was provided by me .  I have reviewed this patient's available data, including medical history, events of note, physical examination, and all test results as part of my evaluation. I have personally reviewed and interpreted all radiology studies.  CBC:  Recent Labs Lab 05/14/16 0909 05/15/16 1117 05/16/16 0251 05/17/16 0217 05/18/16 0213  WBC 18.1* 20.8* 17.9* 27.7* 19.9*  NEUTROABS  --  17.9*  --   --   --   HGB 12.8 11.6* 11.4* 11.1* 11.6*  HCT 40.3 36.6 35.0* 35.2* 36.5  MCV 96.4 95.6 95.6 95.4 95.8  PLT 342 293 276 349 [id number]   Basic Metabolic Panel:  Recent Labs Lab 05/14/16 0909 05/14/16 1208 05/15/16 0248 05/15/16 1927 05/16/16 0918 05/17/16  0910  NA 135  --  137  --  140 139  K 2.6*  --  3.3* 3.9 4.1 3.6  CL 95*  --  100*  --  103 103  CO2 28  --  26  --  25 24  GLUCOSE 174*  --  172*  --  185* 108*  BUN 24*  --  20  --  38* 62*  CREATININE 1.64*  --  1.34*  --  1.76* 1.84*  CALCIUM 8.6*  --  8.6*  --  9.3 8.8*  MG  --  1.9 1.7 2.3 2.4 2.3   GFR: Estimated Creatinine Clearance: 25.9 mL/min (by C-G formula based on SCr of 1.84 mg/dL (H)). Liver Function Tests: No results for input(s): AST, ALT, ALKPHOS, BILITOT, PROT, ALBUMIN in the last 168 hours. No results for input(s): LIPASE, AMYLASE in the last 168 hours. No results for input(s): AMMONIA in the last 168 hours. Coagulation Profile: No results for input(s): INR, PROTIME in the last 168 hours. Cardiac  Enzymes:  Recent Labs Lab 05/14/16 1208 05/14/16 2134 05/15/16 0248 05/15/16 0926  05/16/16 1846 05/17/16 0217 05/17/16 0910 05/17/16 1626 05/18/16 0213  CKTOTAL 1,073*  --  734* 578*  < > 83 68 61 61 51  TROPONINI 11.34* 9.73* 8.24* 7.76*  --   --   --   --   --   --   < > = values in this interval not displayed. BNP (last 3 results) No results for input(s): PROBNP in the last 8760 hours. HbA1C: No results for input(s): HGBA1C in the last 72 hours. CBG:  Recent Labs Lab 05/17/16 1939 05/17/16 2303 05/18/16 0310 05/18/16 0725 05/18/16 1126  GLUCAP 127* 178* 258* 170* 245*   Lipid Profile: No results for input(s): CHOL, HDL, LDLCALC, TRIG, CHOLHDL, LDLDIRECT in the last 72 hours. Thyroid Function Tests: No results for input(s): TSH, T4TOTAL, FREET4, T3FREE, THYROIDAB in the last 72 hours. Anemia Panel: No results for input(s): VITAMINB12, FOLATE, FERRITIN, TIBC, IRON, RETICCTPCT in the last 72 hours. Urine analysis:    Component Value Date/Time   COLORURINE YELLOW 05/14/2016 [place] 05/14/2016 1208   LABSPEC 1.018 05/14/2016 1208   PHURINE 5.0 05/14/2016 1208   GLUCOSEU NEGATIVE 05/14/2016 1208   HGBUR NEGATIVE 05/14/2016 1208   HGBUR negative 01/01/2010 1353   BILIRUBINUR NEGATIVE 05/14/2016 1208   BILIRUBINUR n 05/30/2015 1542   KETONESUR NEGATIVE 05/14/2016 1208   PROTEINUR 30 (A) 05/14/2016 1208   UROBILINOGEN 0.2 05/30/2015 1542   UROBILINOGEN 1.0 11/03/2014 1442   NITRITE NEGATIVE 05/14/2016 1208   LEUKOCYTESUR NEGATIVE 05/14/2016 1208   Sepsis Labs: @LABRCNTIP (procalcitonin:4,lacticidven:4)  ) Recent Results (from the past 240 hour(s))  MRSA PCR Screening     Status: None   Collection Time: 05/15/16 12:34 AM  Result Value Ref Range Status   MRSA by PCR NEGATIVE NEGATIVE Final    Comment:        The GeneXpert MRSA Assay (FDA approved for NASAL specimens only), is one component of a comprehensive MRSA  colonization surveillance program. It is not intended to diagnose MRSA infection nor to guide or monitor treatment for MRSA infections.   Respiratory Panel by PCR     Status: None   Collection Time: 05/15/16  6:03 AM  Result Value Ref Range Status   Adenovirus NOT DETECTED NOT DETECTED Final   Coronavirus 229E NOT DETECTED NOT DETECTED Final   Coronavirus HKU1 NOT DETECTED NOT DETECTED Final   Coronavirus NL63  NOT DETECTED NOT DETECTED Final   Coronavirus OC43 NOT DETECTED NOT DETECTED Final   Metapneumovirus NOT DETECTED NOT DETECTED Final   Rhinovirus / Enterovirus NOT DETECTED NOT DETECTED Final   Influenza A NOT DETECTED NOT DETECTED Final   Influenza B NOT DETECTED NOT DETECTED Final   Parainfluenza Virus 1 NOT DETECTED NOT DETECTED Final   Parainfluenza Virus 2 NOT DETECTED NOT DETECTED Final   Parainfluenza Virus 3 NOT DETECTED NOT DETECTED Final   Parainfluenza Virus 4 NOT DETECTED NOT DETECTED Final   Respiratory Syncytial Virus NOT DETECTED NOT DETECTED Final   Bordetella pertussis NOT DETECTED NOT DETECTED Final   Chlamydophila pneumoniae NOT DETECTED NOT DETECTED Final   Mycoplasma pneumoniae NOT DETECTED NOT DETECTED Final  Culture, blood (routine x 2)     Status: None (Preliminary result)   Collection Time: 05/15/16 11:20 AM  Result Value Ref Range Status   Specimen Description BLOOD RIGHT ANTECUBITAL  Final   Special Requests BOTTLES DRAWN AEROBIC AND ANAEROBIC  2CC  Final   Culture NO GROWTH 2 DAYS  Final   Report Status PENDING  Incomplete  Culture, blood (routine x 2)     Status: None (Preliminary result)   Collection Time: 05/15/16 11:31 AM  Result Value Ref Range Status   Specimen Description BLOOD RIGHT HAND  Final   Special Requests BOTTLES DRAWN AEROBIC ONLY  5CC  Final   Culture NO GROWTH 2 DAYS  Final   Report Status PENDING  Incomplete  Urine culture     Status: None   Collection Time: 05/15/16 12:30 PM  Result Value Ref Range Status   Specimen  Description URINE, CATHETERIZED  Final   Special Requests NONE  Final   Culture NO GROWTH  Final   Report Status 05/16/2016 FINAL  Final  Culture, respiratory (NON-Expectorated)     Status: None   Collection Time: 05/16/16  6:03 AM  Result Value Ref Range Status   Specimen Description TRACHEAL ASPIRATE  Final   Special Requests NONE  Final   Gram Stain   Final    RARE WBC PRESENT,BOTH PMN AND MONONUCLEAR FEW SQUAMOUS EPITHELIAL CELLS PRESENT RARE GRAM POSITIVE COCCI IN PAIRS RARE GRAM NEGATIVE COCCOBACILLI RARE GRAM POSITIVE RODS    Culture Consistent with normal respiratory flora.  Final   Report Status 05/18/2016 FINAL  Final         Radiology Studies: No results found.      Scheduled Meds: . allopurinol  300 mg Oral Daily  . aspirin  324 mg Oral Once  . atorvastatin  40 mg Oral q1800  . bisoprolol  10 mg Oral Daily  . chlorhexidine  15 mL Mouth Rinse BID  . dextromethorphan-guaiFENesin  1 tablet Oral BID  . DULoxetine  60 mg Oral BID  . furosemide  40 mg Oral BID  . insulin aspart  0-15 Units Subcutaneous Q4H  . insulin glargine  5 Units Subcutaneous Daily  . ipratropium-albuterol  3 mL Nebulization Q6H  . lactated ringers  1,000 mL Intravenous Once  . levofloxacin (LEVAQUIN) IV  750 mg Intravenous Q48H  . mouth rinse  15 mL Mouth Rinse q12n4p  . predniSONE  50 mg Oral Q breakfast   Continuous Infusions: . heparin 1,550 Units/hr (05/18/16 0600)     LOS: 4 days    Time spent: 40 minutes    Arlon Bleier, Geraldo Docker, MD [facility] Pager [phone]   If 7PM-7AM, please contact night-coverage [email or web address] Password [email or web address] 05/18/2016, 11:30 AM

## 2016-05-19 ENCOUNTER — Encounter (HOSPITAL_COMMUNITY): Payer: Self-pay | Admitting: *Deleted

## 2016-05-19 DIAGNOSIS — I771 Stricture of artery: Secondary | ICD-10-CM

## 2016-05-19 LAB — CBC WITH DIFFERENTIAL/PLATELET
BASOS PCT: 0 %
Basophils Absolute: 0 10*3/uL (ref 0.0–0.1)
EOS PCT: 0 %
Eosinophils Absolute: 0 10*3/uL (ref 0.0–0.7)
HCT: 35.9 % — ABNORMAL LOW (ref 36.0–46.0)
HEMOGLOBIN: 11.4 g/dL — AB (ref 12.0–15.0)
LYMPHS PCT: 6 %
Lymphs Abs: 1.5 10*3/uL (ref 0.7–4.0)
MCH: 30.1 pg (ref 26.0–34.0)
MCHC: 31.8 g/dL (ref 30.0–36.0)
MCV: 94.7 fL (ref 78.0–100.0)
Monocytes Absolute: 1.7 10*3/uL — ABNORMAL HIGH (ref 0.1–1.0)
Monocytes Relative: 7 %
NEUTROS PCT: 87 %
Neutro Abs: 21.3 10*3/uL — ABNORMAL HIGH (ref 1.7–7.7)
Platelets: 321 10*3/uL (ref 150–400)
RBC: 3.79 MIL/uL — AB (ref 3.87–5.11)
RDW: 15.1 % (ref 11.5–15.5)
WBC: 24.5 10*3/uL — ABNORMAL HIGH (ref 4.0–10.5)

## 2016-05-19 LAB — HEPARIN LEVEL (UNFRACTIONATED)
HEPARIN UNFRACTIONATED: 0.58 [IU]/mL (ref 0.30–0.70)
Heparin Unfractionated: 0.8 IU/mL — ABNORMAL HIGH (ref 0.30–0.70)

## 2016-05-19 LAB — MAGNESIUM: Magnesium: 2 mg/dL (ref 1.7–2.4)

## 2016-05-19 LAB — BASIC METABOLIC PANEL
ANION GAP: 10 (ref 5–15)
BUN: 49 mg/dL — ABNORMAL HIGH (ref 6–20)
CHLORIDE: 104 mmol/L (ref 101–111)
CO2: 26 mmol/L (ref 22–32)
Calcium: 9 mg/dL (ref 8.9–10.3)
Creatinine, Ser: 1.46 mg/dL — ABNORMAL HIGH (ref 0.44–1.00)
GFR calc Af Amer: 39 mL/min — ABNORMAL LOW (ref >60)
GFR calc non Af Amer: 33 mL/min — ABNORMAL LOW (ref >60)
GLUCOSE: 113 mg/dL — AB (ref 65–99)
POTASSIUM: 3.9 mmol/L (ref 3.5–5.1)
Sodium: 140 mmol/L (ref 135–145)

## 2016-05-19 LAB — GLUCOSE, CAPILLARY
GLUCOSE-CAPILLARY: 103 mg/dL — AB (ref 65–99)
GLUCOSE-CAPILLARY: 144 mg/dL — AB (ref 65–99)
Glucose-Capillary: 127 mg/dL — ABNORMAL HIGH (ref 65–99)
Glucose-Capillary: 237 mg/dL — ABNORMAL HIGH (ref 65–99)
Glucose-Capillary: 287 mg/dL — ABNORMAL HIGH (ref 65–99)

## 2016-05-19 LAB — CK
Total CK: 33 U/L — ABNORMAL LOW (ref 38–234)
Total CK: 35 U/L — ABNORMAL LOW (ref 38–234)

## 2016-05-19 LAB — TROPONIN I: TROPONIN I: 0.96 ng/mL — AB (ref <0.03)

## 2016-05-19 MED ORDER — ALPRAZOLAM 0.25 MG PO TABS
0.2500 mg | ORAL_TABLET | Freq: Three times a day (TID) | ORAL | Status: DC | PRN
Start: 2016-05-19 — End: 2016-05-22
  Administered 2016-05-19 – 2016-05-21 (×3): 0.25 mg via ORAL
  Filled 2016-05-19 (×3): qty 1

## 2016-05-19 MED ORDER — INSULIN ASPART 100 UNIT/ML ~~LOC~~ SOLN
0.0000 [IU] | Freq: Three times a day (TID) | SUBCUTANEOUS | Status: DC
  Administered 2016-05-19 – 2016-05-20 (×2): 7 [IU] via SUBCUTANEOUS
  Administered 2016-05-20: 4 [IU] via SUBCUTANEOUS
  Administered 2016-05-21: 3 [IU] via SUBCUTANEOUS
  Administered 2016-05-22: 7 [IU] via SUBCUTANEOUS
  Administered 2016-05-23: 4 [IU] via SUBCUTANEOUS

## 2016-05-19 MED ORDER — IPRATROPIUM-ALBUTEROL 0.5-2.5 (3) MG/3ML IN SOLN
3.0000 mL | Freq: Four times a day (QID) | RESPIRATORY_TRACT | Status: DC
  Administered 2016-05-19 – 2016-05-20 (×6): 3 mL via RESPIRATORY_TRACT
  Filled 2016-05-19 (×6): qty 3

## 2016-05-19 MED ORDER — BISOPROLOL FUMARATE 5 MG PO TABS
5.0000 mg | ORAL_TABLET | Freq: Every day | ORAL | Status: DC
  Administered 2016-05-19 – 2016-05-23 (×5): 5 mg via ORAL
  Filled 2016-05-19 (×4): qty 1

## 2016-05-19 MED ORDER — FUROSEMIDE 10 MG/ML IJ SOLN
40.0000 mg | Freq: Two times a day (BID) | INTRAMUSCULAR | Status: DC
  Administered 2016-05-19 – 2016-05-20 (×2): 40 mg via INTRAVENOUS
  Filled 2016-05-19 (×2): qty 4

## 2016-05-19 MED ORDER — BUDESONIDE 0.25 MG/2ML IN SUSP
0.2500 mg | Freq: Two times a day (BID) | RESPIRATORY_TRACT | Status: DC
  Administered 2016-05-19 – 2016-05-23 (×4): 0.25 mg via RESPIRATORY_TRACT
  Filled 2016-05-19 (×6): qty 2

## 2016-05-19 MED ORDER — INSULIN ASPART 100 UNIT/ML ~~LOC~~ SOLN
0.0000 [IU] | Freq: Every day | SUBCUTANEOUS | Status: DC
  Administered 2016-05-19: 3 [IU] via SUBCUTANEOUS
  Administered 2016-05-20: 4 [IU] via SUBCUTANEOUS

## 2016-05-19 MED ORDER — LEVOFLOXACIN 750 MG PO TABS
750.0000 mg | ORAL_TABLET | ORAL | Status: AC
  Administered 2016-05-19 – 2016-05-23 (×3): 750 mg via ORAL
  Filled 2016-05-19 (×3): qty 1

## 2016-05-19 MED ORDER — BISOPROLOL FUMARATE 5 MG PO TABS: 5.0000 mg | ORAL_TABLET | Freq: Every day | ORAL | Status: DC

## 2016-05-19 MED ORDER — PREDNISONE 20 MG PO TABS
20.0000 mg | ORAL_TABLET | Freq: Every day | ORAL | Status: DC
  Administered 2016-05-20 – 2016-05-23 (×4): 20 mg via ORAL
  Filled 2016-05-19 (×4): qty 1

## 2016-05-19 NOTE — Progress Notes (Signed)
Patient Name: Sydney Phillips Date of Encounter: 05/19/2016  Primary Cardiologist: Dr. [facility] Problem List     Active Problems:   Essential hypertension   Coronary atherosclerosis   Hypokalemia   NSTEMI (non-ST elevated myocardial infarction) (HCC)   COPD exacerbation (HCC)   Chronic combined systolic and diastolic CHF (congestive heart failure) (HCC)   Encephalopathy   CAD in native artery   Acute renal failure with acute tubular necrosis superimposed on stage 3 chronic kidney disease (HCC)   Somnolence   OSA and COPD overlap syndrome (HCC)   Cardiomyopathy, ischemic   Ischemic mitral valve regurgitation   Pulmonary hypertension   Controlled diabetes mellitus type 2 with complications ([place])   Fall at home     Subjective   Feels well, appears lethargic. Denies chest pain and SOB.   Inpatient Medications    Scheduled Meds: . allopurinol  300 mg Oral Daily  . aspirin  324 mg Oral Once  . atorvastatin  40 mg Oral q1800  . bisoprolol  5 mg Oral Daily  . chlorhexidine  15 mL Mouth Rinse BID  . dextromethorphan-guaiFENesin  1 tablet Oral BID  . DULoxetine  60 mg Oral BID  . insulin aspart  0-20 Units Subcutaneous Q4H  . insulin glargine  12 Units Subcutaneous Daily  . ipratropium-albuterol  3 mL Nebulization Q6H  . lactated ringers  1,000 mL Intravenous Once  . levofloxacin  750 mg Oral Q48H  . mouth rinse  15 mL Mouth Rinse q12n4p  . predniSONE  50 mg Oral Q breakfast   Continuous Infusions: . heparin 1,400 Units/hr (05/19/16 0700)   PRN Meds: ALPRAZolam, HYDROcodone-acetaminophen, levalbuterol, nitroGLYCERIN, ondansetron (ZOFRAN) IV, sodium chloride flush   Vital Signs    Vitals:   05/19/16 0800 05/19/16 0918 05/19/16 1051 05/19/16 1100  BP: (!) 102/56 97/63 102/67 96/68  Pulse: 76  70 68  Resp: 17 (!) 24 18 19   Temp:    98.1 F (36.7 C)  TempSrc:    Oral  SpO2: 100%  100% 100%  Weight:      Height:        Intake/Output Summary (Last  24 hours) at 05/19/16 1129 Last data filed at 05/19/16 1000  Gross per 24 hour  Intake          1218.58 ml  Output              675 ml  Net           543.58 ml   Filed Weights   05/17/16 0402 05/18/16 0300 05/19/16 0433  Weight: 182 lb 5.1 oz (82.7 kg) 184 lb 15.5 oz (83.9 kg) 191 lb 9.3 oz (86.9 kg)    Physical Exam   GEN: Well nourished, well developed, in no acute distress.  HEENT: Grossly normal.  Neck: Supple, no JVD, carotid bruits, or masses. Cardiac: RRR, no murmurs, rubs, or gallops. No clubbing, cyanosis, edema.  Radials/DP/PT 2+ and equal bilaterally.  Respiratory:  Respirations regular and unlabored, Diminished throughout GI: Soft, nontender, nondistended, BS + x 4. MS: no deformity or atrophy. Skin: warm and dry, no rash. Neuro:  Strength and sensation are intact. Psych: AAOx3.  Normal affect.  Labs    CBC  Recent Labs  05/18/16 0213 05/19/16 0349  WBC 19.9* 24.5*  NEUTROABS  --  21.3*  HGB 11.6* 11.4*  HCT 36.5 35.9*  MCV 95.8 94.7  PLT 326 [id number]   Basic Metabolic Panel  Recent Labs  05/18/16  1230 05/19/16 0349  NA 139 140  K 4.2 3.9  CL 104 104  CO2 23 26  GLUCOSE 218* 113*  BUN 55* 49*  CREATININE 1.70* 1.46*  CALCIUM 8.8* 9.0  MG 2.0 2.0   Cardiac Enzymes  Recent Labs  05/18/16 1831 05/19/16 0349 05/19/16 0924  CKTOTAL 29* 33* 35*  TROPONINI  --  0.96*  --      Telemetry    V paced - Personally Reviewed  ECG     Atrial sensed ventricular paced- Personally Reviewed  Radiology    No results found.  Cardiac Studies  Transthoracic Echocardiography 05/15/16 Study Conclusions  - Left ventricle: The cavity size was normal. Systolic function was   severely reduced. The estimated ejection fraction was in the   range of 25% to 30%. Diffuse hypokinesis. Features are consistent   with a pseudonormal left ventricular filling pattern, with   concomitant abnormal relaxation and increased filling pressure   (grade 2 diastolic  dysfunction). Doppler parameters are   consistent with elevated ventricular end-diastolic filling   pressure. - Ventricular septum: The contour showed diastolic flattening and   systolic flattening. - Aortic valve: Trileaflet; mildly thickened, mildly calcified   leaflets. There was no regurgitation. - Aortic root: The aortic root was normal in size. - Mitral valve: Structurally normal valve. There was moderate to   severe regurgitation. - Left atrium: The atrium was moderately dilated. - Right ventricle: The cavity size was normal. Wall thickness was   normal. Systolic function was normal. - Right atrium: Pacer wire or catheter noted in right atrium. - Tricuspid valve: There was moderate regurgitation. - Pulmonic valve: Transvalvular velocity was within the normal   range. There was no evidence for stenosis. - Pulmonary arteries: Systolic pressure was moderately to severely   increased. PA peak pressure: 52 mm Hg (S). - Pericardium, extracardiac: There was no pericardial effusion.  Impressions:  - When compared to the prior study from 02/27/2016 LVEF has further   decreased, now 25-30% with diffuse hypokinesis. Elevated filling   pressures.   Mitral regurgitation is at least moderate to severe.   Moderate to severe pulmonary hypertension, RVSP 52 mmHg.   Patient Profile     Sydney Phillips is a 80 year old female with a past medical history of ischemic/nonischemic cardiomyophathy (mixed etiology) s/p CRT-D, carotid artery disease, COPD, HLD, and DM. She presented to the ED with altered mental status, and ruled in for NSTEMI.   Assessment & Plan    1. NSTEMI: It was felt that in the setting of her acute COPD exacerbation, the majority of her elevated troponin was representative of demand ischemia. Her symptoms were primarily pulmonary.   She is known to have moderate CAD by cath in 2009 with ongoing tobacco abuse and likely has progression of her CAD. Plan for R/L heart cath when  creatinine is stabilized.   2. Acute respiratory failure/COPD exacerbation: Her respiratory failure is felt to be due to a COPD exacerbation. She has had a cough productive of yellow sputum, weakness and worsening dyspnea for several weeks. On arrival she was profoundly hypoxic. Her troponin is elevated after a prolonged down time at home (CK also over 1000). The troponin elevation likely represents demand ischemia in a patient with known moderately severe CAD (75% stenosis in LAD and 75% stenosis in RCA by cath in 2009). Consider R/LHC this week if creatinine is stable.  3. Acute renal insufficiency: Improving.   4. LV dysfunction/mixed ischemic and nonischemic  cardiomyopathy: ICD in place. Echo Sept 2017 with LVEF=40-45%. Limited echo this admission with LVEF=25-30%.   On bisoprolol, no ACE-I or ARB in setting of renal insufficiency.    Signed, Arbutus Leas, NP  05/19/2016, 11:29 AM   I have seen and examined the patient along with Jettie Booze, NP.  I have reviewed the chart, notes and new data.  I agree with NP's note.  Key new complaints: very sleepy, but oriented when awakened Key examination changes: JVP mildly elevated (7-8 cm) Key new findings / data: ECG nondiagnostic for ischemia due to BiV pacing; small initial r wave in V 1 suggests we are effectively pacing the LV, but she is due a full interrogation (last check 02/11/16); echo shows unexpected marked reduction in LVEF; improving creatinine, essentially back to baseline.  PLAN: Interrogate CRT-D device to check for BiV pacing efficiency and Optivol. Right and left heart cath tomorrow for marked drop in EF, possible CAD progression. Known left subclavian stenosis/possible occlusion (note retrograde left vertebral flow). Check BP in R arm only.  If she needs CABG, will need to reevaluate the left subclavian/LIMA angiographically.  [name], MD, [place] [phone] 05/19/2016, 12:56 PM

## 2016-05-19 NOTE — Progress Notes (Signed)
RT made 2 attempts to place patient on CPAP with no success. RN to contact RT when patient is ready. RN aware.

## 2016-05-19 NOTE — Progress Notes (Signed)
Pt son, [name] (self reported HCPOA) called and asked for update from cardiology. Page sent to NP with directions to find patients family contact info.

## 2016-05-19 NOTE — Progress Notes (Signed)
Patient is currently on [facility] with sats of 100%. Patient is in no distress and all vitals are stable at this time. BIPAP is not needed at this time. Will continue to monitor.

## 2016-05-19 NOTE — Progress Notes (Signed)
Text paged to rounding hospitalist to see if MD wants AM furosemide and bisoprolol given with patients BP soft, see below:  Vitals:   05/19/16 0800 05/19/16 0918  BP: (!) 102/56 97/63  Pulse: 76   Resp: 17 (!) 24  Temp:

## 2016-05-19 NOTE — Progress Notes (Signed)
Inpatient Diabetes Program Recommendations  AACE/ADA: New Consensus Statement on Inpatient Glycemic Control (2015)  Target Ranges:  Prepandial:   less than 140 mg/dL      Peak postprandial:   less than 180 mg/dL (1-2 hours)      Critically ill patients:  140 - 180 mg/dL   Results for Sydney Phillips, Sydney Phillips (MRN [id number]) as of 05/19/2016 09:23  Ref. Range 05/18/2016 07:25 05/18/2016 11:26 05/18/2016 15:37 05/18/2016 21:24 05/19/2016 00:26 05/19/2016 07:47  Glucose-Capillary Latest Ref Range: 65 - 99 mg/dL 170 (H) 245 (H) 296 (H) 250 (H) 144 (H) 103 (H)   Review of Glycemic Control  Diabetes history: DM 2 Outpatient Diabetes medications: None Current orders for Inpatient glycemic control: Lantus 12, Novolog Resistant Q4hours  Inpatient Diabetes Program Recommendations:   Patient has diet ordered and is eating. Please consider changing frequency of Novolog Correction to TID and add HS scale. Also consider Novolog 3 units TID meal coverage in addition to correction due to postprandial hyperglycemia Due to PO prednisone 50 mg Daily.   Thanks,  Tama Headings RN, MSN, [facility] Inpatient Diabetes Coordinator Team Pager [phone] (8a-5p)

## 2016-05-19 NOTE — Progress Notes (Signed)
[facility]  [id number] DOB: [birth year] DOA: 05/14/2016 PCP: Laurey Morale, MD    Brief Narrative:  80 y.o.F Hx Depression with Anxiety, Fibromyalgia, HTN, LBBB, CAD, Chronic Systolic and Diastolic CHF s/p AICD, carotid artery disease s/p left CEA, left subclavian artery stenosis s/p stent, PAD s/p right fem-pop, COPD on 2 L O2 at home, OSA, hyperlipidemia, chronic pain syndrome, and DM who presented to the ED after being found down by her son for an unknown amount of time. She had suffered 3 falls recently thought to be secondary to medications. She was not sure why she fell.  Subjective: The pt is very sleepy.  She will not awaken for me. She does not appear to be in any acute distress or uncontrolled pain.  Her respirations are not labored.    Assessment & Plan:  Altered mental status -Likely multifactorial to include COPD exacerbation, NSTEMI, overuse of Narcotic/Benzodiazepine, noncompliance w/ CPAP - check ABG to assess pCO2 if she is not more alert following her current nap   Acute on Chronic hypoxic respiratory failure - Acute COPD exacerbation  -DuoNeb QID -Albuterol PRN -Mucinex DM BID -Flutter valve -Physiotherapy vest QID (refuses to use) -Prednisone 50 mg daily -Titrate O2 to maintain SPO2 > 88% -5 day course antibiotics -Respiratory virus panel negative, tracheal aspirate negative  OSA and COPD overlap syndrome -CPAP or BiPAP per respiratory: Maintain SPO2> 89%  NSTEMI -Cardiology plans on catheterization in AM therefore will keep on SDU for now    Chronic Systolic and Diastolic CHF / Ischemic Cardiomyopathy -S/P AICD -Cardiology following and directing med tx  -History of EF of 20% in 2009. EF of 40-45% with diffuse hypokinesis and grade one diastolic dysfunction in [id number].  -this admit TTE notes EF decreased to 25-30% -Bisoprolol daily -diuresis being complicated by hypotension - resume lasix this evening -  lower dose of bisoprolol   L subclavian stenosis  -Assure BP is being measured in R arm only   Mitral valve regurgitation -Per Cardiology has worsened since September 2017  Pulmonary hypertension  CAD native artery -for cardiac cath in AM   Acute on CKD stage III -baseline Cr 1.0 to 1.32 - follow w/ ongoing attempts at diuresis   Recent Labs Lab 05/15/16 0248 05/16/16 0918 05/17/16 0910 05/18/16 1230 05/19/16 0349  CREATININE 1.34* 1.76* 1.84* 1.70* 1.46*    DM2 -11/30 A1c 6.9 - CBG reasonably well controlled at this time   HLD -Lipid panel: Not within ADA guidelines -Lipitor 40 mg daily:Tolerating W/O side effects. Thursday increased to 80 mg daily   DVT prophylaxis: heparin gtt Code Status: FULL CODE Family Communication: no family present at time of exam  Disposition Plan: SDU w/ pending cath in AM - may progress to CABG   Consultants:  PCCM  [facility] Cardiology  Procedures: 11/30 Echocardiogram: LVEF 25% to 30%. Diffuse hypokinesis. -(grade 2 diastolic dysfunction).  - Ventricular septum: The contour showed diastolic flattening and systolic flattening. -- Mitral valve: moderate to severe regurgitation. - Left atrium: moderately dilated. - Tricuspid valve: moderate regurgitation.- Pulmonary arteries:  PA peak pressure: 52 mm Hg (S).  Antimicrobials:  Levofloxacin 11/30 >  Objective: Blood pressure 96/68, pulse 68, temperature 98.1 F (36.7 C), temperature source Oral, resp. rate 19, height 5\' 3"  (1.6 m), weight 86.9 kg (191 lb 9.3 oz), SpO2 100 %.  Intake/Output Summary (Last 24 hours) at 05/19/16 1217 Last data filed at 05/19/16 1000  Gross per 24 hour  Intake          1218.58 ml  Output              675 ml  Net           543.58 ml   Filed Weights   05/17/16 0402 05/18/16 0300 05/19/16 0433  Weight: 82.7 kg (182 lb 5.1 oz) 83.9 kg (184 lb 15.5 oz) 86.9 kg (191 lb 9.3 oz)    Examination: General: No acute respiratory distress -  somnolent  Lungs: Clear to auscultation bilaterally without wheezes or crackles - distant BS th/o  Cardiovascular: Regular rate and rhythm without gallop or rub normal S1 and S2 Abdomen: Nontender, nondistended, soft, bowel sounds positive, no rebound, no ascites, no appreciable mass Extremities: No significant cyanosis or clubbing - 1+ edema bilateral lower extremities  CBC:  Recent Labs Lab 05/15/16 1117 05/16/16 0251 05/17/16 0217 05/18/16 0213 05/19/16 0349  WBC 20.8* 17.9* 27.7* 19.9* 24.5*  NEUTROABS 17.9*  --   --   --  21.3*  HGB 11.6* 11.4* 11.1* 11.6* 11.4*  HCT 36.6 35.0* 35.2* 36.5 35.9*  MCV 95.6 95.6 95.4 95.8 94.7  PLT 293 276 349 326 [id number]   Basic Metabolic Panel:  Recent Labs Lab 05/15/16 0248 05/15/16 1927 05/16/16 0918 05/17/16 0910 05/18/16 1230 05/19/16 0349  NA 137  --  140 139 139 140  K 3.3* 3.9 4.1 3.6 4.2 3.9  CL 100*  --  103 103 104 104  CO2 26  --  25 24 23 26   GLUCOSE 172*  --  185* 108* 218* 113*  BUN 20  --  38* 62* 55* 49*  CREATININE 1.34*  --  1.76* 1.84* 1.70* 1.46*  CALCIUM 8.6*  --  9.3 8.8* 8.8* 9.0  MG 1.7 2.3 2.4 2.3 2.0 2.0   GFR: Estimated Creatinine Clearance: 33.2 mL/min (by C-G formula based on SCr of 1.46 mg/dL (H)).  Liver Function Tests: No results for input(s): AST, ALT, ALKPHOS, BILITOT, PROT, ALBUMIN in the last 168 hours. No results for input(s): LIPASE, AMYLASE in the last 168 hours. No results for input(s): AMMONIA in the last 168 hours.  Cardiac Enzymes:  Recent Labs Lab 05/14/16 1208 05/14/16 2134 05/15/16 0248 05/15/16 0926  05/18/16 0213 05/18/16 1231 05/18/16 1831 05/19/16 0349 05/19/16 0924  CKTOTAL 1,073*  --  734* 578*  < > 51 39 29* 33* 35*  TROPONINI 11.34* 9.73* 8.24* 7.76*  --   --   --   --  0.96*  --   < > = values in this interval not displayed.  HbA1C: Hgb A1c MFr Bld  Date/Time Value Ref Range Status  05/15/2016 02:48 AM 6.9 (H) 4.8 - 5.6 % Final    Comment:    (NOTE)          Pre-diabetes: 5.7 - 6.4         Diabetes: >6.4         Glycemic control for adults with diabetes: <7.0   05/14/2016 09:34 PM 6.9 (H) 4.8 - 5.6 % Final    Comment:    (NOTE)         Pre-diabetes: 5.7 - 6.4         Diabetes: >6.4         Glycemic control for adults with diabetes: <7.0     CBG:  Recent Labs Lab 05/18/16 1537 05/18/16 2124 05/19/16 0026 05/19/16 0747 05/19/16 1125  GLUCAP 296* 250* 144* 103* 127*    Recent  Results (from the past 240 hour(s))  MRSA PCR Screening     Status: None   Collection Time: 05/15/16 12:34 AM  Result Value Ref Range Status   MRSA by PCR NEGATIVE NEGATIVE Final    Comment:        The GeneXpert MRSA Assay (FDA approved for NASAL specimens only), is one component of a comprehensive MRSA colonization surveillance program. It is not intended to diagnose MRSA infection nor to guide or monitor treatment for MRSA infections.   Respiratory Panel by PCR     Status: None   Collection Time: 05/15/16  6:03 AM  Result Value Ref Range Status   Adenovirus NOT DETECTED NOT DETECTED Final   Coronavirus 229E NOT DETECTED NOT DETECTED Final   Coronavirus HKU1 NOT DETECTED NOT DETECTED Final   Coronavirus NL63 NOT DETECTED NOT DETECTED Final   Coronavirus OC43 NOT DETECTED NOT DETECTED Final   Metapneumovirus NOT DETECTED NOT DETECTED Final   Rhinovirus / Enterovirus NOT DETECTED NOT DETECTED Final   Influenza A NOT DETECTED NOT DETECTED Final   Influenza B NOT DETECTED NOT DETECTED Final   Parainfluenza Virus 1 NOT DETECTED NOT DETECTED Final   Parainfluenza Virus 2 NOT DETECTED NOT DETECTED Final   Parainfluenza Virus 3 NOT DETECTED NOT DETECTED Final   Parainfluenza Virus 4 NOT DETECTED NOT DETECTED Final   Respiratory Syncytial Virus NOT DETECTED NOT DETECTED Final   Bordetella pertussis NOT DETECTED NOT DETECTED Final   Chlamydophila pneumoniae NOT DETECTED NOT DETECTED Final   Mycoplasma pneumoniae NOT DETECTED NOT DETECTED Final    Culture, blood (routine x 2)     Status: None (Preliminary result)   Collection Time: 05/15/16 11:20 AM  Result Value Ref Range Status   Specimen Description BLOOD RIGHT ANTECUBITAL  Final   Special Requests BOTTLES DRAWN AEROBIC AND ANAEROBIC  2CC  Final   Culture NO GROWTH 3 DAYS  Final   Report Status PENDING  Incomplete  Culture, blood (routine x 2)     Status: None (Preliminary result)   Collection Time: 05/15/16 11:31 AM  Result Value Ref Range Status   Specimen Description BLOOD RIGHT HAND  Final   Special Requests BOTTLES DRAWN AEROBIC ONLY  5CC  Final   Culture NO GROWTH 3 DAYS  Final   Report Status PENDING  Incomplete  Urine culture     Status: None   Collection Time: 05/15/16 12:30 PM  Result Value Ref Range Status   Specimen Description URINE, CATHETERIZED  Final   Special Requests NONE  Final   Culture NO GROWTH  Final   Report Status 05/16/2016 FINAL  Final  Culture, respiratory (NON-Expectorated)     Status: None   Collection Time: 05/16/16  6:03 AM  Result Value Ref Range Status   Specimen Description TRACHEAL ASPIRATE  Final   Special Requests NONE  Final   Gram Stain   Final    RARE WBC PRESENT,BOTH PMN AND MONONUCLEAR FEW SQUAMOUS EPITHELIAL CELLS PRESENT RARE GRAM POSITIVE COCCI IN PAIRS RARE GRAM NEGATIVE COCCOBACILLI RARE GRAM POSITIVE RODS    Culture Consistent with normal respiratory flora.  Final   Report Status 05/18/2016 FINAL  Final     Scheduled Meds: . allopurinol  300 mg Oral Daily  . aspirin  324 mg Oral Once  . atorvastatin  40 mg Oral q1800  . bisoprolol  5 mg Oral Daily  . chlorhexidine  15 mL Mouth Rinse BID  . dextromethorphan-guaiFENesin  1 tablet Oral BID  .  DULoxetine  60 mg Oral BID  . insulin aspart  0-20 Units Subcutaneous Q4H  . insulin glargine  12 Units Subcutaneous Daily  . ipratropium-albuterol  3 mL Nebulization Q6H  . lactated ringers  1,000 mL Intravenous Once  . levofloxacin  750 mg Oral Q48H  . mouth rinse  15  mL Mouth Rinse q12n4p  . predniSONE  50 mg Oral Q breakfast   Continuous Infusions: . heparin 1,400 Units/hr (05/19/16 0700)     LOS: 5 days   Cherene Altes, MD [facility] Office  [phone] Pager - Text Page per Shea Evans as per below:  On-Call/Text Page:      [email or web address]      password [email or web address]  If 7PM-7AM, please contact night-coverage [email or web address] Password [email or web address] 05/19/2016, 12:17 PM

## 2016-05-19 NOTE — Progress Notes (Signed)
ANTICOAGULATION CONSULT NOTE - Follow Up Consult  Pharmacy Consult for heparin Indication: chest pain/ACS  Labs:  Recent Labs  05/16/16 0918  05/17/16 0217 05/17/16 0910 05/17/16 1626 05/18/16 0213 05/18/16 1230 05/18/16 1231 05/18/16 1831 05/19/16 0349  HGB  --   < > 11.1*  --   --  11.6*  --   --   --  11.4*  HCT  --   --  35.2*  --   --  36.5  --   --   --  35.9*  PLT  --   --  349  --   --  326  --   --   --  321  HEPARINUNFRC 0.40  --  0.19* 0.50 0.54 0.64  --   --   --  0.80*  CREATININE 1.76*  --   --  1.84*  --   --  1.70*  --   --   --   CKTOTAL 111  < > 68 61 61 51  --  39 29*  --   < > = values in this interval not displayed.   Assessment: 80yo female now above goal on heparin after three levels at goal though had been trending up.  Goal of Therapy:  Heparin level 0.3-0.7 units/ml   Plan:  Will decrease heparin gtt by 2 units/kg/hr to 1400 units/hr and check level in [place], PharmD, BCPS  05/19/2016,5:13 AM

## 2016-05-19 NOTE — Progress Notes (Signed)
Full CRT-D device interrogation shows normal battery and lead parameters and effective >97% Bi-V pacing, no atrial fibrillation and no VT. The Optivol (thoracic impedance) has been very volatile for several months and shows a severe fluid accumulation over last several weeks. There is a noticeable reduction in activity level for the last 6 months.

## 2016-05-19 NOTE — Progress Notes (Signed)
[place] for Heparin Indication: chest pain/ACS  Allergies  Allergen Reactions  . Potassium-Containing Compounds Other (See Comments)    Causes severe constipation  . Azithromycin Rash  . Codeine Itching  . Darvon Itching  . Erythromycin Rash  . Meloxicam Other (See Comments)    Unknown    . Norco [Hydrocodone-Acetaminophen] Itching  . Penicillins Rash    Has patient had a PCN reaction causing immediate rash, facial/tongue/throat swelling, SOB or lightheadedness with hypotension: unknown Has patient had a PCN reaction causing severe rash involving mucus membranes or skin necrosis: no Has patient had a PCN reaction that required hospitalization: unknown Has patient had a PCN reaction occurring within the last 10 years: no If all of the above answers are "NO", then may proceed with Cephalosporin use.   Marland Kitchen Propoxyphene N-Acetaminophen Itching  . Rofecoxib Other (See Comments)    Unknown    . Rosuvastatin Other (See Comments)    cramps  . Statins Itching and Other (See Comments)    Sleeplessness  . Sulfa Antibiotics Rash    Patient Measurements: Height: 5\' 3"  (160 cm) Weight: 191 lb 9.3 oz (86.9 kg) IBW/kg (Calculated) : 52.4 Heparin Dosing Weight: 70kg  Vital Signs: Temp: 97.5 F (36.4 C) (12/04 1915) Temp Source: Oral (12/04 1915) BP: 118/73 (12/04 1915) Pulse Rate: 68 (12/04 1915)  Labs:  Recent Labs  05/17/16 0217 05/17/16 0910  05/18/16 0213 05/18/16 1230  05/18/16 1831 05/19/16 0349 05/19/16 0924 05/19/16 1911  HGB 11.1*  --   --  11.6*  --   --   --  11.4*  --   --   HCT 35.2*  --   --  36.5  --   --   --  35.9*  --   --   PLT 349  --   --  326  --   --   --  321  --   --   HEPARINUNFRC 0.19* 0.50  < > 0.64  --   --   --  0.80*  --  0.58  CREATININE  --  1.84*  --   --  1.70*  --   --  1.46*  --   --   CKTOTAL 68 61  < > 51  --   < > 29* 33* 35*  --   TROPONINI  --   --   --   --   --   --   --  0.96*  --   --    < > = values in this interval not displayed.  Estimated Creatinine Clearance: 33.2 mL/min (by C-G formula based on SCr of 1.46 mg/dL (H)).  Assessment: 59 yoF on heparin for NSTEMI.  -Heparin level remains therapeutic at 0.64 on 1400 units/h.   Goal of Therapy:  Heparin level 0.3-0.7 units/ml Monitor platelets by anticoagulation protocol: Yes   Plan:  No heparin changes needed Daily heparin level/CBC  Hildred Laser, Pharm D 05/19/2016 8:19 PM

## 2016-05-20 ENCOUNTER — Encounter (HOSPITAL_COMMUNITY)
Admission: EM | Disposition: A | Discharge status: Skilled Nursing Facility | Payer: Self-pay | Source: Home / Self Care | Attending: Internal Medicine

## 2016-05-20 ENCOUNTER — Encounter (HOSPITAL_COMMUNITY): Payer: Self-pay | Admitting: Cardiovascular Disease

## 2016-05-20 HISTORY — PX: CARDIAC CATHETERIZATION: SHX172

## 2016-05-20 LAB — CBC
HCT: 37.7 % (ref 36.0–46.0)
HEMATOCRIT: 37.4 % (ref 36.0–46.0)
HEMOGLOBIN: 11.7 g/dL — AB (ref 12.0–15.0)
Hemoglobin: 12.1 g/dL (ref 12.0–15.0)
MCH: 29.8 pg (ref 26.0–34.0)
MCH: 30.3 pg (ref 26.0–34.0)
MCHC: 31.3 g/dL (ref 30.0–36.0)
MCHC: 32.1 g/dL (ref 30.0–36.0)
MCV: 95.2 fL (ref 78.0–100.0)
MCV: 94.5 fL (ref 78.0–100.0)
PLATELETS: 344 10*3/uL (ref 150–400)
Platelets: 342 10*3/uL (ref 150–400)
RBC: 3.93 MIL/uL (ref 3.87–5.11)
RBC: 3.99 MIL/uL (ref 3.87–5.11)
RDW: 15.1 % (ref 11.5–15.5)
RDW: 15.1 % (ref 11.5–15.5)
WBC: 25.8 10*3/uL — ABNORMAL HIGH (ref 4.0–10.5)
WBC: 22.6 10*3/uL — ABNORMAL HIGH (ref 4.0–10.5)

## 2016-05-20 LAB — BASIC METABOLIC PANEL
ANION GAP: 9 (ref 5–15)
BUN: 43 mg/dL — ABNORMAL HIGH (ref 6–20)
CHLORIDE: 100 mmol/L — AB (ref 101–111)
CO2: 30 mmol/L (ref 22–32)
Calcium: 9.3 mg/dL (ref 8.9–10.3)
Creatinine, Ser: 1.45 mg/dL — ABNORMAL HIGH (ref 0.44–1.00)
GFR calc non Af Amer: 34 mL/min — ABNORMAL LOW (ref >60)
GFR, EST AFRICAN AMERICAN: 39 mL/min — AB (ref >60)
GLUCOSE: 114 mg/dL — AB (ref 65–99)
POTASSIUM: 4.1 mmol/L (ref 3.5–5.1)
Sodium: 139 mmol/L (ref 135–145)

## 2016-05-20 LAB — CULTURE, BLOOD (ROUTINE X 2)
CULTURE: NO GROWTH
Culture: NO GROWTH

## 2016-05-20 LAB — POCT I-STAT 3, VENOUS BLOOD GAS (G3P V)
ACID-BASE EXCESS: 6 mmol/L — AB (ref 0.0–2.0)
BICARBONATE: 32.7 mmol/L — AB (ref 20.0–28.0)
O2 Saturation: 57 %
TCO2: 34 mmol/L (ref 0–100)
pCO2, Ven: 55.1 mmHg (ref 44.0–60.0)
pH, Ven: 7.381 (ref 7.250–7.430)
pO2, Ven: 31 mmHg — CL (ref 32.0–45.0)

## 2016-05-20 LAB — CREATININE, SERUM
CREATININE: 1.27 mg/dL — AB (ref 0.44–1.00)
GFR calc Af Amer: 46 mL/min — ABNORMAL LOW (ref >60)
GFR calc non Af Amer: 39 mL/min — ABNORMAL LOW (ref >60)

## 2016-05-20 LAB — GLUCOSE, CAPILLARY
GLUCOSE-CAPILLARY: 112 mg/dL — AB (ref 65–99)
Glucose-Capillary: 156 mg/dL — ABNORMAL HIGH (ref 65–99)
Glucose-Capillary: 236 mg/dL — ABNORMAL HIGH (ref 65–99)
Glucose-Capillary: 306 mg/dL — ABNORMAL HIGH (ref 65–99)

## 2016-05-20 LAB — POCT I-STAT 3, ART BLOOD GAS (G3+)
ACID-BASE EXCESS: 5 mmol/L — AB (ref 0.0–2.0)
BICARBONATE: 31 mmol/L — AB (ref 20.0–28.0)
O2 SAT: 94 %
PO2 ART: 73 mmHg — AB (ref 83.0–108.0)
TCO2: 33 mmol/L (ref 0–100)
pCO2 arterial: 49.6 mmHg — ABNORMAL HIGH (ref 32.0–48.0)
pH, Arterial: 7.404 (ref 7.350–7.450)

## 2016-05-20 LAB — HEPARIN LEVEL (UNFRACTIONATED): Heparin Unfractionated: 0.63 IU/mL (ref 0.30–0.70)

## 2016-05-20 LAB — POCT ACTIVATED CLOTTING TIME: Activated Clotting Time: 357 seconds

## 2016-05-20 LAB — PROTIME-INR
INR: 1.22
PROTHROMBIN TIME: 15.5 s — AB (ref 11.4–15.2)

## 2016-05-20 SURGERY — RIGHT/LEFT HEART CATH AND CORONARY ANGIOGRAPHY

## 2016-05-20 MED ORDER — SODIUM CHLORIDE 0.9 % IV SOLN
INTRAVENOUS | Status: DC
  Administered 2016-05-20: 11:00:00 via INTRAVENOUS

## 2016-05-20 MED ORDER — ASPIRIN 81 MG PO CHEW
81.0000 mg | CHEWABLE_TABLET | Freq: Every day | ORAL | Status: DC
  Administered 2016-05-21 – 2016-05-23 (×3): 81 mg via ORAL
  Filled 2016-05-20 (×3): qty 1

## 2016-05-20 MED ORDER — ATROPINE SULFATE 1 MG/10ML IJ SOSY
PREFILLED_SYRINGE | INTRAMUSCULAR | Status: AC
  Filled 2016-05-20: qty 10

## 2016-05-20 MED ORDER — NITROGLYCERIN 1 MG/10 ML FOR IR/CATH LAB
INTRA_ARTERIAL | Status: DC | PRN
  Administered 2016-05-20 (×2): 200 ug via INTRACORONARY

## 2016-05-20 MED ORDER — FENTANYL CITRATE (PF) 100 MCG/2ML IJ SOLN
INTRAMUSCULAR | Status: DC | PRN
  Administered 2016-05-20: 50 ug via INTRAVENOUS
  Administered 2016-05-20 (×2): 25 ug via INTRAVENOUS

## 2016-05-20 MED ORDER — LABETALOL HCL 5 MG/ML IV SOLN: 10.0000 mg | INTRAVENOUS | Status: AC | PRN

## 2016-05-20 MED ORDER — BIVALIRUDIN BOLUS VIA INFUSION - CUPID
INTRAVENOUS | Status: DC | PRN
  Administered 2016-05-20: 61.275 mg via INTRAVENOUS

## 2016-05-20 MED ORDER — HEPARIN SODIUM (PORCINE) 5000 UNIT/ML IJ SOLN
5000.0000 [IU] | Freq: Three times a day (TID) | INTRAMUSCULAR | Status: DC
  Administered 2016-05-20 – 2016-05-23 (×8): 5000 [IU] via SUBCUTANEOUS
  Filled 2016-05-20 (×7): qty 1

## 2016-05-20 MED ORDER — SODIUM CHLORIDE 0.9 % IV SOLN: 250.0000 mL | INTRAVENOUS | Status: DC | PRN

## 2016-05-20 MED ORDER — ASPIRIN 81 MG PO CHEW: 81.0000 mg | CHEWABLE_TABLET | ORAL | Status: DC

## 2016-05-20 MED ORDER — IOPAMIDOL (ISOVUE-370) INJECTION 76%
INTRAVENOUS | Status: AC
  Filled 2016-05-20: qty 100

## 2016-05-20 MED ORDER — IOPAMIDOL (ISOVUE-370) INJECTION 76%
INTRAVENOUS | Status: AC
Start: 2016-05-20 — End: 2016-05-20
  Filled 2016-05-20: qty 50

## 2016-05-20 MED ORDER — SODIUM CHLORIDE 0.9 % IV SOLN
INTRAVENOUS | Status: DC
  Administered 2016-05-20: 06:00:00 via INTRAVENOUS

## 2016-05-20 MED ORDER — LIDOCAINE HCL (PF) 1 % IJ SOLN
INTRAMUSCULAR | Status: AC
  Filled 2016-05-20: qty 30

## 2016-05-20 MED ORDER — MIDAZOLAM HCL 2 MG/2ML IJ SOLN
INTRAMUSCULAR | Status: DC | PRN
  Administered 2016-05-20: 1 mg via INTRAVENOUS
  Administered 2016-05-20: 2 mg via INTRAVENOUS
  Administered 2016-05-20: 1 mg via INTRAVENOUS

## 2016-05-20 MED ORDER — SODIUM CHLORIDE 0.9% FLUSH: 3.0000 mL | INTRAVENOUS | Status: DC | PRN

## 2016-05-20 MED ORDER — SODIUM CHLORIDE 0.9% FLUSH
3.0000 mL | Freq: Two times a day (BID) | INTRAVENOUS | Status: DC
Start: 2016-05-20 — End: 2016-05-23
  Administered 2016-05-20 – 2016-05-23 (×6): 3 mL via INTRAVENOUS

## 2016-05-20 MED ORDER — HYDRALAZINE HCL 20 MG/ML IJ SOLN: 5.0000 mg | INTRAMUSCULAR | Status: AC | PRN

## 2016-05-20 MED ORDER — MIDAZOLAM HCL 2 MG/2ML IJ SOLN
INTRAMUSCULAR | Status: AC
  Filled 2016-05-20: qty 2

## 2016-05-20 MED ORDER — NITROGLYCERIN 1 MG/10 ML FOR IR/CATH LAB
INTRA_ARTERIAL | Status: AC
Start: 2016-05-20 — End: 2016-05-20
  Filled 2016-05-20: qty 10

## 2016-05-20 MED ORDER — LIDOCAINE HCL (PF) 1 % IJ SOLN
INTRAMUSCULAR | Status: DC | PRN
  Administered 2016-05-20: 20 mL

## 2016-05-20 MED ORDER — ASPIRIN 81 MG PO CHEW
81.0000 mg | CHEWABLE_TABLET | ORAL | Status: AC
  Administered 2016-05-20: 81 mg via ORAL
  Filled 2016-05-20: qty 1

## 2016-05-20 MED ORDER — SODIUM CHLORIDE 0.9 % IV SOLN
INTRAVENOUS | Status: DC | PRN
  Administered 2016-05-20: 1.75 mg/kg/h via INTRAVENOUS

## 2016-05-20 MED ORDER — FENTANYL CITRATE (PF) 100 MCG/2ML IJ SOLN
INTRAMUSCULAR | Status: AC
  Filled 2016-05-20: qty 2

## 2016-05-20 MED ORDER — TICAGRELOR 90 MG PO TABS
90.0000 mg | ORAL_TABLET | Freq: Two times a day (BID) | ORAL | Status: DC
  Administered 2016-05-20 – 2016-05-23 (×6): 90 mg via ORAL
  Filled 2016-05-20 (×6): qty 1

## 2016-05-20 MED ORDER — IOPAMIDOL (ISOVUE-370) INJECTION 76%
INTRAVENOUS | Status: DC | PRN
  Administered 2016-05-20: 115 mL via INTRA_ARTERIAL

## 2016-05-20 MED ORDER — TICAGRELOR 90 MG PO TABS
ORAL_TABLET | ORAL | Status: DC | PRN
  Administered 2016-05-20: 180 mg via ORAL

## 2016-05-20 MED ORDER — BIVALIRUDIN 250 MG IV SOLR
INTRAVENOUS | Status: AC
  Filled 2016-05-20: qty 250

## 2016-05-20 MED ORDER — HEPARIN (PORCINE) IN NACL 2-0.9 UNIT/ML-% IJ SOLN
INTRAMUSCULAR | Status: DC | PRN
  Administered 2016-05-20: 1000 mL

## 2016-05-20 MED ORDER — SODIUM CHLORIDE 0.9 % IV SOLN: INTRAVENOUS | Status: AC

## 2016-05-20 MED ORDER — TICAGRELOR 90 MG PO TABS
ORAL_TABLET | ORAL | Status: AC
  Filled 2016-05-20: qty 1

## 2016-05-20 MED ORDER — HEPARIN (PORCINE) IN NACL 2-0.9 UNIT/ML-% IJ SOLN
INTRAMUSCULAR | Status: AC
  Filled 2016-05-20: qty 1000

## 2016-05-20 SURGICAL SUPPLY — 21 items
BALLN EUPHORA RX 2.5X15 (BALLOONS) ×3
BALLN ~~LOC~~ EUPHORA RX 3.25X27 (BALLOONS) ×3
BALLOON EUPHORA RX 2.5X15 (BALLOONS) IMPLANT
BALLOON ~~LOC~~ EUPHORA RX 3.25X27 (BALLOONS) IMPLANT
CATH INFINITI 5FR MULTPACK ANG (CATHETERS) ×2 IMPLANT
CATH LAUNCHER 6FR AL1 (CATHETERS) IMPLANT
CATH SWAN GANZ 7F STRAIGHT (CATHETERS) ×2 IMPLANT
CATH VISTA GUIDE 6FR JR4 (CATHETERS) ×2 IMPLANT
CATHETER LAUNCHER 6FR AL1 (CATHETERS) ×3
KIT ENCORE 26 ADVANTAGE (KITS) ×2 IMPLANT
KIT HEART LEFT (KITS) ×3 IMPLANT
PACK CARDIAC CATHETERIZATION (CUSTOM PROCEDURE TRAY) ×3 IMPLANT
SHEATH PINNACLE 5F 10CM (SHEATH) ×2 IMPLANT
SHEATH PINNACLE 6F 10CM (SHEATH) ×2 IMPLANT
SHEATH PINNACLE 7F 10CM (SHEATH) ×2 IMPLANT
STENT RESOLUTE ONYX 3.0X30 (Permanent Stent) ×2 IMPLANT
STENT RESOLUTE ONYX3.0X38 (Permanent Stent) ×1 IMPLANT
TRANSDUCER W/STOPCOCK (MISCELLANEOUS) ×3 IMPLANT
TUBING CIL FLEX 10 FLL-RA (TUBING) ×3 IMPLANT
WIRE COUGAR XT STRL 190CM (WIRE) ×4 IMPLANT
WIRE EMERALD 3MM-J .035X150CM (WIRE) ×2 IMPLANT

## 2016-05-20 NOTE — Progress Notes (Signed)
Patient Name: Sydney Phillips Date of Encounter: 05/20/2016  Primary Cardiologist: [facility] Problem List     Active Problems:   Essential hypertension   Coronary atherosclerosis   Hypokalemia   Biventricular implantable cardioverter-defibrillator in situ   NSTEMI (non-ST elevated myocardial infarction) (HCC)   COPD exacerbation (HCC)   Acute on chronic combined systolic and diastolic ACC/AHA stage C congestive heart failure (HCC)   Encephalopathy   CAD in native artery   Acute renal failure with acute tubular necrosis superimposed on stage 3 chronic kidney disease (HCC)   Somnolence   OSA and COPD overlap syndrome (HCC)   Cardiomyopathy, ischemic   Ischemic mitral valve regurgitation   Pulmonary hypertension   Controlled diabetes mellitus type 2 with complications (HCC)   Fall at home   Stenosis of left subclavian artery (HCC)     Subjective   Seen immediate post cath. Still a little sedated. Images and care plan reviewed with Dr. Excell Seltzer. Substantial risk for CIN, but mean PAWP 26 mm Hg. DAPT with ASA and Brilinta 12 months - consider Twilight Clinical Trial. Gentle fluids x 4 hours, likely further diuresis tomorrow if renal function stable.  Fick Cardiac Output 4.29 L/min  Fick Cardiac Output Index 2.26 (L/min)/BSA  RA A Wave 9 mmHg  RA V Wave 9 mmHg  RA Mean 7 mmHg  RV Systolic Pressure 52 mmHg  RV Diastolic Pressure 9 mmHg  RV EDP 12 mmHg  PA Systolic Pressure 56 mmHg  PA Diastolic Pressure 19 mmHg  PA Mean 35 mmHg  PW A Wave 26 mmHg  PW V Wave 37 mmHg  PW Mean 26 mmHg  AO Systolic Pressure 131 mmHg  AO Diastolic Pressure 52 mmHg  AO Mean 79 mmHg  LV Systolic Pressure 135 mmHg  LV Diastolic Pressure 8 mmHg  LV EDP 27 mmHg     Inpatient Medications    Scheduled Meds: . allopurinol  300 mg Oral Daily  . aspirin  324 mg Oral Once  . [START ON 05/21/2016] aspirin  81 mg Oral Daily  . atorvastatin  40 mg Oral q1800  . bisoprolol  5 mg Oral  Daily  . budesonide (PULMICORT) nebulizer solution  0.25 mg Nebulization BID  . chlorhexidine  15 mL Mouth Rinse BID  . dextromethorphan-guaiFENesin  1 tablet Oral BID  . DULoxetine  60 mg Oral BID  . heparin  5,000 Units Subcutaneous Q8H  . insulin aspart  0-20 Units Subcutaneous TID WC  . insulin aspart  0-5 Units Subcutaneous QHS  . insulin glargine  12 Units Subcutaneous Daily  . ipratropium-albuterol  3 mL Nebulization QID  . levofloxacin  750 mg Oral Q48H  . mouth rinse  15 mL Mouth Rinse q12n4p  . predniSONE  20 mg Oral Q breakfast  . sodium chloride flush  3 mL Intravenous Q12H  . ticagrelor  90 mg Oral BID   Continuous Infusions: . sodium chloride 65 mL/hr at 05/20/16 1045  . sodium chloride 10 mL/hr at 05/20/16 1045   PRN Meds: sodium chloride, ALPRAZolam, hydrALAZINE, HYDROcodone-acetaminophen, labetalol, levalbuterol, nitroGLYCERIN, ondansetron (ZOFRAN) IV, sodium chloride flush, sodium chloride flush   Vital Signs    Vitals:   05/20/16 1015 05/20/16 1030 05/20/16 1045 05/20/16 1100  BP: (!) 148/87 (!) 152/107 (!) 149/50 (!) 153/56  Pulse: 74 67 68 72  Resp: 20 18 14 16   Temp: 98.3 F (36.8 C)     TempSrc: Axillary     SpO2: 97% 99% 98% 98%  Weight:      Height:        Intake/Output Summary (Last 24 hours) at 05/20/16 1108 Last data filed at 05/20/16 1045  Gross per 24 hour  Intake           304.04 ml  Output             3770 ml  Net         -3465.96 ml   Filed Weights   05/18/16 0300 05/19/16 0433 05/20/16 0500  Weight: 184 lb 15.5 oz (83.9 kg) 191 lb 9.3 oz (86.9 kg) 180 lb 1.9 oz (81.7 kg)    Physical Exam  Lightly sedated GEN: Well nourished, well developed, in no acute distress.  HEENT: Grossly normal.  Neck: Supple, no JVD, carotid bruits, or masses. Cardiac: RRR, no murmurs, rubs, or gallops. No clubbing, cyanosis, edema.  Radials/DP/PT 2+ and equal bilaterally.  Respiratory:  Respirations regular and unlabored, clear to auscultation  bilaterally. GI: Soft, nontender, nondistended, BS + x 4. MS: no deformity or atrophy. Skin: warm and dry, no rash. Neuro:  Strength and sensation are intact. Psych: AAOx3.  Normal affect.  Labs    CBC  Recent Labs  05/19/16 0349 05/20/16 0549  WBC 24.5* 25.8*  NEUTROABS 21.3*  --   HGB 11.4* 11.7*  HCT 35.9* 37.4  MCV 94.7 95.2  PLT 321 342   Basic Metabolic Panel  Recent Labs  05/18/16 1230 05/19/16 0349 05/20/16 0549  NA 139 140 139  K 4.2 3.9 4.1  CL 104 104 100*  CO2 23 26 30   GLUCOSE 218* 113* 114*  BUN 55* 49* 43*  CREATININE 1.70* 1.46* 1.45*  CALCIUM 8.8* 9.0 9.3  MG 2.0 2.0  --    Liver Function Tests No results for input(s): AST, ALT, ALKPHOS, BILITOT, PROT, ALBUMIN in the last 72 hours. No results for input(s): LIPASE, AMYLASE in the last 72 hours. Cardiac Enzymes  Recent Labs  05/18/16 1831 05/19/16 0349 05/19/16 0924  CKTOTAL 29* 33* 35*  TROPONINI  --  0.96*  --     Telemetry    As BiV paced - Personally Reviewed  Cardiac Studies   Diagnostic Diagram     Post-Intervention Diagram     Implants     Permanent Stent  Stent Resolute Onyx3.0x38 - [id number] - Implanted    Inventory item: Stent Resolute Onyx3.0x38 Model/Cat number: [id number]  Manufacturer: [employer] Lot number: [id number]  Device identifier: [id number] Device identifier type: GS1  GUDID Information   Request status Successful    Brand name: Resolute OnyxT Version/Model: [id number]  Company name: [employer] MRI safety info as of 05/20/16: MR Conditional  Contains dry or latex rubber: No    GMDN P.T. name: Drug-eluting coronary artery stent, non-bioabsorbable-polymer-coated    As of 05/20/2016   Status: Implanted      Stent Resolute Onyx 3.0x30 - [id number] - Implanted    Inventory item: Stent Resolute Onyx 3.0x30 Model/Cat number: [id number]  Manufacturer: [employer] Lot number: [id number]  Device  identifier: [id number] Device identifier type: GS1  GUDID Information   Request status Successful    Brand name: Resolute OnyxT Version/Model: [id number]  Company name: [employer] MRI safety info as of 05/20/16: MR Conditional  Contains dry or latex rubber: No    GMDN P.T. name: Drug-eluting coronary artery stent, non-bioabsorbable-polymer-coated         Patient Profile     Ms. Olivia is a 80 year old female with a  past medical history of ischemic/nonischemic cardiomyophathy (mixed etiology) s/p CRT-D, carotid artery disease, COPD, HLD, and DM. She presented to the ED with altered mental status, and ruled in for NSTEMI.  Evidence of acute on chronic CHF by Optivol and confirmed by [facility]. Progression of CAD by [facility], now s/p DES to RCA and LCX on 12/5  Assessment & Plan    1. CAD s/p NSTEMI s/p DES LCX and RCA: DAPT with ASA and Brilinta 12 months - consider Twilight Clinical Trial. Has moderate LAD stenosis, not far from ostium.  2. Acute on chronic systolic and diastolic HF: Her respiratory failure is felt to be due to a COPD exacerbation, but she also has significant elevation in left heart pressures. Need to enroll in device HF follow up with Randon Goldsmith at [place]. On bisoprolol, no ACE-I or ARB in setting of renal insufficiency. Consider  3. Acute respiratory failure/COPD exacerbation: She has had a cough productive of yellow sputum, weakness and worsening dyspnea for several weeks. There was also evidence of Optivol abnormality for a similar duration. 5. Acute renal insufficiency: Improving, need to watch for deterioration after contrast exposure.  Gentle fluids x 4 hours, diuresis tomorrow if renal function stable.   Signed, Thurmon Fair, MD  05/20/2016, 11:08 AM

## 2016-05-20 NOTE — Progress Notes (Signed)
PROGRESS NOTE    Sydney Phillips  [id number] DOB: 1935/11/12 DOA: 05/14/2016 PCP: Laurey Morale, MD   Brief Narrative:  80 y.o. WF PMHx Depression with Anxiety,Fibromyalgia, HTN, MI, LBBB, CAD, Chronic Systolic and Diastolic CHF s/p AICD, carotid artery disease s/p left CEA, left subclavian artery stenosis s/p stent, PAD s/p right fem-pop, COPD on 2 L O2 at home, OSA, hyperlipidemia. Chronic pain syndrome, Diabetes mellitus without complication   Patient presented to the ED after being found down by her son for an unknown amount of time. She has had about 3 falls recently thought to be secondary to medications. She is not sure of why she fell. She reports some associated right sided chest wall pain.    Subjective: 12/5   A/O 4, NAD   .   Assessment & Plan:   Active Problems:   Essential hypertension   Coronary atherosclerosis   Hypokalemia   Biventricular implantable cardioverter-defibrillator in situ   NSTEMI (non-ST elevated myocardial infarction) (HCC)   COPD exacerbation (HCC)   Acute on chronic combined systolic and diastolic ACC/AHA stage C congestive heart failure (HCC)   Encephalopathy   CAD in native artery   Acute renal failure with acute tubular necrosis superimposed on stage 3 chronic kidney disease (HCC)   Somnolence   OSA and COPD overlap syndrome (HCC)   Cardiomyopathy, ischemic   Ischemic mitral valve regurgitation   Pulmonary hypertension   Controlled diabetes mellitus type 2 with complications ([place])   Fall at home   Stenosis of left subclavian artery (HCC)  Altered mental status -Likely multifactorial to include COPD exacerbation, NSTEMI, overuse of Narcotic/Benzodiazepine, noncompliance. -Resolved  Acute on Chronic hypoxic respiratory failure -DuoNeb QID -Albuterol PRN -Mucinex DM BID -Flutter valve -Physiotherapy vest QID (refuses to use) -Prednisone 50 mg daily -Titrate O2 to maintain SPO2 > 88% -Complete 5 day course  antibiotics -Respiratory virus panel negative, tracheal aspirate negative,urine negative  OSA and COPD overlap syndrome -CPAP or BiPAP per respiratory: Maintain SPO2> 89%  NSTEMI -Cardiology plans on catheterization when patient more stable.   Chronic Systolic and Diastolic CHF/Ischemic Cardiomyopathy -S/P AICD -Cardiology consulted believes patient requires catheterization prior to discharge.  Per cardiology last catheterization significant stenosis in LAD and RCA vessels. -Strict in and out since admission -783 ml -Daily weight Filed Weights   05/18/16 0300 05/19/16 0433 05/20/16 0500  Weight: 83.9 kg (184 lb 15.5 oz) 86.9 kg (191 lb 9.3 oz) 81.7 kg (180 lb 1.9 oz)  -Followed by cardiology outpatient. Significant CAD history. History of EF of 20% in 2009. Last EF of 40-45% with diffuse hypokinesis and grade one diastolic dysfunction in [id number].  -Bisoprolol 10mg  daily -12/3 Cards increased Lasix 40 mg BID  Mitral valve regurgitation -Per cardiology has worsened since September 2017 -See NSTEMI   Pulmonary hypertension -See CHF  CAD native artery -see CHF  Acute on CKD stage III(baseline Cr 1.0 to 1.32) Lab Results  Component Value Date   CREATININE 1.27 (H) 05/20/2016   CREATININE 1.45 (H) 05/20/2016   CREATININE 1.46 (H) 05/19/2016  -Appears patient may be euvolemic and her new baseline Cr is at a higher set level. Decrease Lasix to 40 mg daily  Hypokalemia -Potassium goal> 4  Hypomagnesemia -Magnesium goal> 2  Diabetes type 2 controlled with complication -[id number] Hemoglobin A1c= 6.9 -Increase Lantus 12 units daily -Resistant SSI  HLD -Lipid panel: Not within ADA guidelines -Lipitor 40 mg daily:Tolerating W/O side effects. Thursday increased to 80 mg daily     DVT  prophylaxis: Heparin gtt Code Status: Full Family Communication: None Disposition Plan: Per cardiology   Consultants:  [facility]   Procedures/Significant Events:  11/30  Echocardiogram: LVEF= 25% to 30%. Diffuse hypokinesis. - (grade 2 diastolic dysfunction).  - Ventricular septum: The contour showed diastolic flattening and   systolic flattening. -- Mitral valve: moderate to severe regurgitation. - Left atrium: moderately dilated. - Tricuspid valve: moderate regurgitation.- Pulmonary arteries:  PA peak pressure: 52 mm Hg (S).    VENTILATOR SETTINGS:     Cultures 11/30 blood NGTD 11/30 respiratory virus panel negative 11/30 urine negative 11/30 urine strep pneumo negative/Legionella pending 12/1 tracheal aspirate normal flora    Antimicrobials: Levofloxacin 11/30>>   Devices    LINES / TUBES:  Midline power wand? 12/3>>    Continuous Infusions: . sodium chloride Stopped (05/20/16 1345)     Objective: Vitals:   05/20/16 1600 05/20/16 1625 05/20/16 1629 05/20/16 1700  BP: 125/67 (!) 139/47 (!) 139/47 (!) 153/94  Pulse: 64 68 69 64  Resp: 17 (!) 21 17 (!) 21  Temp:   97.8 F (36.6 C)   TempSrc:   Oral   SpO2: 94% 95% 98% 94%  Weight:      Height:        Intake/Output Summary (Last 24 hours) at 05/20/16 1755 Last data filed at 05/20/16 1750  Gross per 24 hour  Intake           712.96 ml  Output             2320 ml  Net         -1607.04 ml   Filed Weights   05/18/16 0300 05/19/16 0433 05/20/16 0500  Weight: 83.9 kg (184 lb 15.5 oz) 86.9 kg (191 lb 9.3 oz) 81.7 kg (180 lb 1.9 oz)    Examination:  General: A/O 4, positive acute on chronic respiratory distress Eyes: negative scleral hemorrhage, negative anisocoria, negative icterus ENT: Negative Runny nose, negative gingival bleeding, Neck:  Negative scars, masses, torticollis, lymphadenopathy, JVD Lungs: Clear to auscultation bilateral   Cardiovascular: Regular rhythm and rate without murmur gallop or rub normal S1 and S2 Abdomen: Morbidly obese, negative abdominal pain, nondistended, positive soft, bowel sounds, no rebound, no ascites, no appreciable  mass Extremities: No significant cyanosis, clubbing, or edema bilateral lower extremities Skin: Negative rashes, lesions, ulcers Psychiatric:  Unable to fully assess Central nervous system:  Cranial nerves II through XII intact, tongue/uvula midline, all extremities muscle strength 5/5, sensation intact throughout, negative dysarthria, negative expressive aphasia, negative receptive aphasia    Data Reviewed: Care during the described time interval was provided by me .  I have reviewed this patient's available data, including medical history, events of note, physical examination, and all test results as part of my evaluation. I have personally reviewed and interpreted all radiology studies.  CBC:  Recent Labs Lab 05/15/16 1117  05/17/16 0217 05/18/16 0213 05/19/16 0349 05/20/16 0549 05/20/16 1335  WBC 20.8*  < > 27.7* 19.9* 24.5* 25.8* 22.6*  NEUTROABS 17.9*  --   --   --  21.3*  --   --   HGB 11.6*  < > 11.1* 11.6* 11.4* 11.7* 12.1  HCT 36.6  < > 35.2* 36.5 35.9* 37.4 37.7  MCV 95.6  < > 95.4 95.8 94.7 95.2 94.5  PLT 293  < > 349 326 321 342 344  < > = values in this interval not displayed. Basic Metabolic Panel:  Recent Labs Lab 05/15/16 1927 05/16/16  [id number] 05/17/16 0910 05/18/16 1230 05/19/16 0349 05/20/16 0549 05/20/16 1335  NA  --  140 139 139 140 139  --   K 3.9 4.1 3.6 4.2 3.9 4.1  --   CL  --  103 103 104 104 100*  --   CO2  --  25 24 23 26 30   --   GLUCOSE  --  185* 108* 218* 113* 114*  --   BUN  --  38* 62* 55* 49* 43*  --   CREATININE  --  1.76* 1.84* 1.70* 1.46* 1.45* 1.27*  CALCIUM  --  9.3 8.8* 8.8* 9.0 9.3  --   MG 2.3 2.4 2.3 2.0 2.0  --   --    GFR: Estimated Creatinine Clearance: 36.9 mL/min (by C-G formula based on SCr of 1.27 mg/dL (H)). Liver Function Tests: No results for input(s): AST, ALT, ALKPHOS, BILITOT, PROT, ALBUMIN in the last 168 hours. No results for input(s): LIPASE, AMYLASE in the last 168 hours. No results for input(s): AMMONIA in  the last 168 hours. Coagulation Profile:  Recent Labs Lab 05/20/16 0549  INR 1.22   Cardiac Enzymes:  Recent Labs Lab 05/14/16 1208 05/14/16 2134 05/15/16 0248 05/15/16 0926  05/18/16 0213 05/18/16 1231 05/18/16 1831 05/19/16 0349 05/19/16 0924  CKTOTAL 1,073*  --  734* 578*  < > 51 39 29* 33* 35*  TROPONINI 11.34* 9.73* 8.24* 7.76*  --   --   --   --  0.96*  --   < > = values in this interval not displayed. BNP (last 3 results) No results for input(s): PROBNP in the last 8760 hours. HbA1C: No results for input(s): HGBA1C in the last 72 hours. CBG:  Recent Labs Lab 05/19/16 1558 05/19/16 2117 05/20/16 0723 05/20/16 1112 05/20/16 1627  GLUCAP 237* 287* 112* 156* 236*   Lipid Profile: No results for input(s): CHOL, HDL, LDLCALC, TRIG, CHOLHDL, LDLDIRECT in the last 72 hours. Thyroid Function Tests: No results for input(s): TSH, T4TOTAL, FREET4, T3FREE, THYROIDAB in the last 72 hours. Anemia Panel: No results for input(s): VITAMINB12, FOLATE, FERRITIN, TIBC, IRON, RETICCTPCT in the last 72 hours. Urine analysis:    Component Value Date/Time   COLORURINE YELLOW 05/14/2016 [place] 05/14/2016 1208   LABSPEC 1.018 05/14/2016 1208   PHURINE 5.0 05/14/2016 1208   GLUCOSEU NEGATIVE 05/14/2016 1208   HGBUR NEGATIVE 05/14/2016 1208   HGBUR negative 01/01/2010 1353   BILIRUBINUR NEGATIVE 05/14/2016 1208   BILIRUBINUR n 05/30/2015 1542   KETONESUR NEGATIVE 05/14/2016 1208   PROTEINUR 30 (A) 05/14/2016 1208   UROBILINOGEN 0.2 05/30/2015 1542   UROBILINOGEN 1.0 11/03/2014 1442   NITRITE NEGATIVE 05/14/2016 1208   LEUKOCYTESUR NEGATIVE 05/14/2016 1208   Sepsis Labs: @LABRCNTIP (procalcitonin:4,lacticidven:4)  ) Recent Results (from the past 240 hour(s))  MRSA PCR Screening     Status: None   Collection Time: 05/15/16 12:34 AM  Result Value Ref Range Status   MRSA by PCR NEGATIVE NEGATIVE Final    Comment:        The GeneXpert MRSA Assay  (FDA approved for NASAL specimens only), is one component of a comprehensive MRSA colonization surveillance program. It is not intended to diagnose MRSA infection nor to guide or monitor treatment for MRSA infections.   Respiratory Panel by PCR     Status: None   Collection Time: 05/15/16  6:03 AM  Result Value Ref Range Status   Adenovirus NOT DETECTED NOT DETECTED Final   Coronavirus 229E NOT  DETECTED NOT DETECTED Final   Coronavirus HKU1 NOT DETECTED NOT DETECTED Final   Coronavirus NL63 NOT DETECTED NOT DETECTED Final   Coronavirus OC43 NOT DETECTED NOT DETECTED Final   Metapneumovirus NOT DETECTED NOT DETECTED Final   Rhinovirus / Enterovirus NOT DETECTED NOT DETECTED Final   Influenza A NOT DETECTED NOT DETECTED Final   Influenza B NOT DETECTED NOT DETECTED Final   Parainfluenza Virus 1 NOT DETECTED NOT DETECTED Final   Parainfluenza Virus 2 NOT DETECTED NOT DETECTED Final   Parainfluenza Virus 3 NOT DETECTED NOT DETECTED Final   Parainfluenza Virus 4 NOT DETECTED NOT DETECTED Final   Respiratory Syncytial Virus NOT DETECTED NOT DETECTED Final   Bordetella pertussis NOT DETECTED NOT DETECTED Final   Chlamydophila pneumoniae NOT DETECTED NOT DETECTED Final   Mycoplasma pneumoniae NOT DETECTED NOT DETECTED Final  Culture, blood (routine x 2)     Status: None   Collection Time: 05/15/16 11:20 AM  Result Value Ref Range Status   Specimen Description BLOOD RIGHT ANTECUBITAL  Final   Special Requests BOTTLES DRAWN AEROBIC AND ANAEROBIC  2CC  Final   Culture NO GROWTH 5 DAYS  Final   Report Status 05/20/2016 FINAL  Final  Culture, blood (routine x 2)     Status: None   Collection Time: 05/15/16 11:31 AM  Result Value Ref Range Status   Specimen Description BLOOD RIGHT HAND  Final   Special Requests BOTTLES DRAWN AEROBIC ONLY  5CC  Final   Culture NO GROWTH 5 DAYS  Final   Report Status 05/20/2016 FINAL  Final  Urine culture     Status: None   Collection Time: 05/15/16  12:30 PM  Result Value Ref Range Status   Specimen Description URINE, CATHETERIZED  Final   Special Requests NONE  Final   Culture NO GROWTH  Final   Report Status 05/16/2016 FINAL  Final  Culture, respiratory (NON-Expectorated)     Status: None   Collection Time: 05/16/16  6:03 AM  Result Value Ref Range Status   Specimen Description TRACHEAL ASPIRATE  Final   Special Requests NONE  Final   Gram Stain   Final    RARE WBC PRESENT,BOTH PMN AND MONONUCLEAR FEW SQUAMOUS EPITHELIAL CELLS PRESENT RARE GRAM POSITIVE COCCI IN PAIRS RARE GRAM NEGATIVE COCCOBACILLI RARE GRAM POSITIVE RODS    Culture Consistent with normal respiratory flora.  Final   Report Status 05/18/2016 FINAL  Final         Radiology Studies: No results found.      Scheduled Meds: . allopurinol  300 mg Oral Daily  . aspirin  324 mg Oral Once  . [START ON 05/21/2016] aspirin  81 mg Oral Daily  . atorvastatin  40 mg Oral q1800  . bisoprolol  5 mg Oral Daily  . budesonide (PULMICORT) nebulizer solution  0.25 mg Nebulization BID  . chlorhexidine  15 mL Mouth Rinse BID  . dextromethorphan-guaiFENesin  1 tablet Oral BID  . DULoxetine  60 mg Oral BID  . heparin  5,000 Units Subcutaneous Q8H  . insulin aspart  0-20 Units Subcutaneous TID WC  . insulin aspart  0-5 Units Subcutaneous QHS  . insulin glargine  12 Units Subcutaneous Daily  . ipratropium-albuterol  3 mL Nebulization QID  . levofloxacin  750 mg Oral Q48H  . mouth rinse  15 mL Mouth Rinse q12n4p  . predniSONE  20 mg Oral Q breakfast  . sodium chloride flush  3 mL Intravenous Q12H  . ticagrelor  90 mg Oral BID   Continuous Infusions: . sodium chloride Stopped (05/20/16 1345)     LOS: 6 days    Time spent: 40 minutes    WOODS, Geraldo Docker, MD [facility] Pager [phone]   If 7PM-7AM, please contact night-coverage [email or web address] Password [email or web address] 05/20/2016, 5:55 PM

## 2016-05-20 NOTE — H&P (View-Only) (Signed)
Patient Name: Sydney Phillips Date of Encounter: 05/19/2016  Primary Cardiologist: Dr. [facility] Problem List     Active Problems:   Essential hypertension   Coronary atherosclerosis   Hypokalemia   NSTEMI (non-ST elevated myocardial infarction) (HCC)   COPD exacerbation (HCC)   Chronic combined systolic and diastolic CHF (congestive heart failure) (HCC)   Encephalopathy   CAD in native artery   Acute renal failure with acute tubular necrosis superimposed on stage 3 chronic kidney disease (HCC)   Somnolence   OSA and COPD overlap syndrome (HCC)   Cardiomyopathy, ischemic   Ischemic mitral valve regurgitation   Pulmonary hypertension   Controlled diabetes mellitus type 2 with complications ([place])   Fall at home     Subjective   Feels well, appears lethargic. Denies chest pain and SOB.   Inpatient Medications    Scheduled Meds: . allopurinol  300 mg Oral Daily  . aspirin  324 mg Oral Once  . atorvastatin  40 mg Oral q1800  . bisoprolol  5 mg Oral Daily  . chlorhexidine  15 mL Mouth Rinse BID  . dextromethorphan-guaiFENesin  1 tablet Oral BID  . DULoxetine  60 mg Oral BID  . insulin aspart  0-20 Units Subcutaneous Q4H  . insulin glargine  12 Units Subcutaneous Daily  . ipratropium-albuterol  3 mL Nebulization Q6H  . lactated ringers  1,000 mL Intravenous Once  . levofloxacin  750 mg Oral Q48H  . mouth rinse  15 mL Mouth Rinse q12n4p  . predniSONE  50 mg Oral Q breakfast   Continuous Infusions: . heparin 1,400 Units/hr (05/19/16 0700)   PRN Meds: ALPRAZolam, HYDROcodone-acetaminophen, levalbuterol, nitroGLYCERIN, ondansetron (ZOFRAN) IV, sodium chloride flush   Vital Signs    Vitals:   05/19/16 0800 05/19/16 0918 05/19/16 1051 05/19/16 1100  BP: (!) 102/56 97/63 102/67 96/68  Pulse: 76  70 68  Resp: 17 (!) 24 18 19   Temp:    98.1 F (36.7 C)  TempSrc:    Oral  SpO2: 100%  100% 100%  Weight:      Height:        Intake/Output Summary (Last  24 hours) at 05/19/16 1129 Last data filed at 05/19/16 1000  Gross per 24 hour  Intake          1218.58 ml  Output              675 ml  Net           543.58 ml   Filed Weights   05/17/16 0402 05/18/16 0300 05/19/16 0433  Weight: 182 lb 5.1 oz (82.7 kg) 184 lb 15.5 oz (83.9 kg) 191 lb 9.3 oz (86.9 kg)    Physical Exam   GEN: Well nourished, well developed, in no acute distress.  HEENT: Grossly normal.  Neck: Supple, no JVD, carotid bruits, or masses. Cardiac: RRR, no murmurs, rubs, or gallops. No clubbing, cyanosis, edema.  Radials/DP/PT 2+ and equal bilaterally.  Respiratory:  Respirations regular and unlabored, Diminished throughout GI: Soft, nontender, nondistended, BS + x 4. MS: no deformity or atrophy. Skin: warm and dry, no rash. Neuro:  Strength and sensation are intact. Psych: AAOx3.  Normal affect.  Labs    CBC  Recent Labs  05/18/16 0213 05/19/16 0349  WBC 19.9* 24.5*  NEUTROABS  --  21.3*  HGB 11.6* 11.4*  HCT 36.5 35.9*  MCV 95.8 94.7  PLT 326 [id number]   Basic Metabolic Panel  Recent Labs  05/18/16  1230 05/19/16 0349  NA 139 140  K 4.2 3.9  CL 104 104  CO2 23 26  GLUCOSE 218* 113*  BUN 55* 49*  CREATININE 1.70* 1.46*  CALCIUM 8.8* 9.0  MG 2.0 2.0   Cardiac Enzymes  Recent Labs  05/18/16 1831 05/19/16 0349 05/19/16 0924  CKTOTAL 29* 33* 35*  TROPONINI  --  0.96*  --      Telemetry    V paced - Personally Reviewed  ECG     Atrial sensed ventricular paced- Personally Reviewed  Radiology    No results found.  Cardiac Studies  Transthoracic Echocardiography 05/15/16 Study Conclusions  - Left ventricle: The cavity size was normal. Systolic function was   severely reduced. The estimated ejection fraction was in the   range of 25% to 30%. Diffuse hypokinesis. Features are consistent   with a pseudonormal left ventricular filling pattern, with   concomitant abnormal relaxation and increased filling pressure   (grade 2 diastolic  dysfunction). Doppler parameters are   consistent with elevated ventricular end-diastolic filling   pressure. - Ventricular septum: The contour showed diastolic flattening and   systolic flattening. - Aortic valve: Trileaflet; mildly thickened, mildly calcified   leaflets. There was no regurgitation. - Aortic root: The aortic root was normal in size. - Mitral valve: Structurally normal valve. There was moderate to   severe regurgitation. - Left atrium: The atrium was moderately dilated. - Right ventricle: The cavity size was normal. Wall thickness was   normal. Systolic function was normal. - Right atrium: Pacer wire or catheter noted in right atrium. - Tricuspid valve: There was moderate regurgitation. - Pulmonic valve: Transvalvular velocity was within the normal   range. There was no evidence for stenosis. - Pulmonary arteries: Systolic pressure was moderately to severely   increased. PA peak pressure: 52 mm Hg (S). - Pericardium, extracardiac: There was no pericardial effusion.  Impressions:  - When compared to the prior study from 02/27/2016 LVEF has further   decreased, now 25-30% with diffuse hypokinesis. Elevated filling   pressures.   Mitral regurgitation is at least moderate to severe.   Moderate to severe pulmonary hypertension, RVSP 52 mmHg.   Patient Profile     [name] is a 80 year old female with a past medical history of ischemic/nonischemic cardiomyophathy (mixed etiology) s/p CRT-D, carotid artery disease, COPD, HLD, and DM. She presented to the ED with altered mental status, and ruled in for NSTEMI.   Assessment & Plan    1. NSTEMI: It was felt that in the setting of her acute COPD exacerbation, the majority of her elevated troponin was representative of demand ischemia. Her symptoms were primarily pulmonary.   She is known to have moderate CAD by cath in 2009 with ongoing tobacco abuse and likely has progression of her CAD. Plan for R/L heart cath when  creatinine is stabilized.   2. Acute respiratory failure/COPD exacerbation: Her respiratory failure is felt to be due to a COPD exacerbation. She has had a cough productive of yellow sputum, weakness and worsening dyspnea for several weeks. On arrival she was profoundly hypoxic. Her troponin is elevated after a prolonged down time at home (CK also over 1000). The troponin elevation likely represents demand ischemia in a patient with known moderately severe CAD (75% stenosis in LAD and 75% stenosis in RCA by cath in 2009). Consider R/LHC this week if creatinine is stable.  3. Acute renal insufficiency: Improving.   4. LV dysfunction/mixed ischemic and nonischemic  cardiomyopathy: ICD in place. Echo Sept 2017 with LVEF=40-45%. Limited echo this admission with LVEF=25-30%.   On bisoprolol, no ACE-I or ARB in setting of renal insufficiency.    Signed, Arbutus Leas, NP  05/19/2016, 11:29 AM   I have seen and examined the patient along with Jettie Booze, NP.  I have reviewed the chart, notes and new data.  I agree with NP's note.  Key new complaints: very sleepy, but oriented when awakened Key examination changes: JVP mildly elevated (7-8 cm) Key new findings / data: ECG nondiagnostic for ischemia due to BiV pacing; small initial r wave in V 1 suggests we are effectively pacing the LV, but she is due a full interrogation (last check 02/11/16); echo shows unexpected marked reduction in LVEF; improving creatinine, essentially back to baseline.  PLAN: Interrogate CRT-D device to check for BiV pacing efficiency and Optivol. Right and left heart cath tomorrow for marked drop in EF, possible CAD progression. Known left subclavian stenosis/possible occlusion (note retrograde left vertebral flow). Check BP in R arm only.  If she needs CABG, will need to reevaluate the left subclavian/LIMA angiographically.  [name], MD, [facility] [phone] 05/19/2016, 12:56 PM

## 2016-05-20 NOTE — Progress Notes (Addendum)
Site area: Right groin venous and arterial sheath removed  Site Prior to Removal:  Level 0  Pressure Applied For 25 minutes  Bedrest Beginning at 1410  Manual:   Yes.    Patient Status During Pull: stable  Post Pull Groin Site: Level 0  Post Pull Instructions Given: Yes.    Post Pull Pulses Present: Yes, +1 R pedal pulse   Dressing Applied: Pressure dsg    Comments: Maddie Trejuan Matherne RN with Adella Hare RN; VS remained stable during sheath pull

## 2016-05-20 NOTE — Interval H&P Note (Signed)
History and Physical Interval Note:  05/20/2016 Cath Lab Visit (complete for each Cath Lab visit)  Clinical Evaluation Leading to the Procedure:   ACS: Yes.    Non-ACS:    Anginal Classification: CCS III  Anti-ischemic medical therapy: Minimal Therapy (1 class of medications)  Non-Invasive Test Results: No non-invasive testing performed  Prior CABG: No previous CABG       8:03 AM  Sydney Phillips  has presented today for surgery, with the diagnosis of n stemi  The various methods of treatment have been discussed with the patient and family. After consideration of risks, benefits and other options for treatment, the patient has consented to  Procedure(s): Left Heart Cath and Coronary Angiography (N/A) as a surgical intervention .  The patient's history has been reviewed, patient examined, no change in status, stable for surgery.  I have reviewed the patient's chart and labs.  Questions were answered to the patient's satisfaction.     Sherren Mocha

## 2016-05-20 NOTE — Care Management Note (Addendum)
Case Management Note  Patient Details  Name: VERDELL BASICH MRN: [id number] Date of Birth: 03/22/1936  Subjective/Objective:     Adm w nstemi               Action/Plan: act w thn nse, lives w son, pcp dr fry   Expected Discharge Date:               Expected Discharge Plan:  [place]  In-House Referral:     Discharge planning Services  CM Consult, Medication Assistance  Post Acute Care Choice:  Resumption of Svcs/PTA Provider Choice offered to:     DME Arranged:    DME Agency:     HH Arranged:  Disease Management, RN [place] Agency:  [place]  Status of Service:  In process, will continue to follow  If discussed at Long Length of Stay Meetings, dates discussed:    Additional Comments: gave pt 30day free brilinta card.S/W MICHAEL @ [facility] # [phone]   BRILINTA 90 MG BID  30/60 TAB   COVER- YES  CO-PAY-  $ 45.00          Q/L OF 2 PILLS PER DAY  TIER- 3 DRUG  PRIOR APPROVAL- NO  PHARMACY : [facility], [facility] AND [facility] Gell, RN 05/20/2016, 11:36 AM

## 2016-05-21 DIAGNOSIS — G4733 Obstructive sleep apnea (adult) (pediatric): Secondary | ICD-10-CM

## 2016-05-21 DIAGNOSIS — I5043 Acute on chronic combined systolic (congestive) and diastolic (congestive) heart failure: Secondary | ICD-10-CM

## 2016-05-21 DIAGNOSIS — J449 Chronic obstructive pulmonary disease, unspecified: Secondary | ICD-10-CM

## 2016-05-21 DIAGNOSIS — Z95810 Presence of automatic (implantable) cardiac defibrillator: Secondary | ICD-10-CM

## 2016-05-21 LAB — BASIC METABOLIC PANEL
ANION GAP: 9 (ref 5–15)
BUN: 32 mg/dL — AB (ref 6–20)
CO2: 33 mmol/L — AB (ref 22–32)
Calcium: 9.2 mg/dL (ref 8.9–10.3)
Chloride: 101 mmol/L (ref 101–111)
Creatinine, Ser: 1.42 mg/dL — ABNORMAL HIGH (ref 0.44–1.00)
GFR calc Af Amer: 40 mL/min — ABNORMAL LOW (ref >60)
GFR calc non Af Amer: 34 mL/min — ABNORMAL LOW (ref >60)
GLUCOSE: 76 mg/dL (ref 65–99)
POTASSIUM: 4 mmol/L (ref 3.5–5.1)
Sodium: 143 mmol/L (ref 135–145)

## 2016-05-21 LAB — CBC
HEMATOCRIT: 39.5 % (ref 36.0–46.0)
HEMOGLOBIN: 12.5 g/dL (ref 12.0–15.0)
MCH: 30.2 pg (ref 26.0–34.0)
MCHC: 31.6 g/dL (ref 30.0–36.0)
MCV: 95.4 fL (ref 78.0–100.0)
Platelets: 330 10*3/uL (ref 150–400)
RBC: 4.14 MIL/uL (ref 3.87–5.11)
RDW: 15.3 % (ref 11.5–15.5)
WBC: 21.1 10*3/uL — ABNORMAL HIGH (ref 4.0–10.5)

## 2016-05-21 LAB — GLUCOSE, CAPILLARY
GLUCOSE-CAPILLARY: 152 mg/dL — AB (ref 65–99)
Glucose-Capillary: 81 mg/dL (ref 65–99)

## 2016-05-21 MED ORDER — FUROSEMIDE 10 MG/ML IJ SOLN
40.0000 mg | Freq: Two times a day (BID) | INTRAMUSCULAR | Status: DC
  Administered 2016-05-21 – 2016-05-22 (×2): 40 mg via INTRAVENOUS
  Filled 2016-05-21 (×2): qty 4

## 2016-05-21 MED ORDER — IPRATROPIUM-ALBUTEROL 0.5-2.5 (3) MG/3ML IN SOLN
3.0000 mL | Freq: Three times a day (TID) | RESPIRATORY_TRACT | Status: DC
  Administered 2016-05-22 – 2016-05-23 (×2): 3 mL via RESPIRATORY_TRACT
  Filled 2016-05-21 (×4): qty 3

## 2016-05-21 NOTE — Progress Notes (Signed)
PROGRESS NOTE    Sydney Phillips  [id number] DOB: 09-05-1935 DOA: 05/14/2016 PCP: Laurey Morale, MD   Brief Narrative:  80 y.o. WF PMHx Depression with Anxiety,Fibromyalgia, HTN, MI, LBBB, CAD, Chronic Systolic and Diastolic CHF s/p AICD, carotid artery disease s/p left CEA, left subclavian artery stenosis s/p stent, PAD s/p right fem-pop, COPD on 2 L O2 at home, OSA, hyperlipidemia. Chronic pain syndrome, Diabetes mellitus without complication   Patient presented to the ED after being found down by her son for an unknown amount of time. She has had about 3 falls recently thought to be secondary to medications. She is not sure of why she fell. She reports some associated right sided chest wall pain.    Subjective:hypoxic with ambulation, in the 70% per PT on RA   Assessment & Plan:   Active Problems:   Essential hypertension   Coronary atherosclerosis   Hypokalemia   Biventricular implantable cardioverter-defibrillator in situ   NSTEMI (non-ST elevated myocardial infarction) (HCC)   COPD exacerbation (HCC)   Acute on chronic combined systolic and diastolic ACC/AHA stage C congestive heart failure (HCC)   Encephalopathy   CAD in native artery   Acute renal failure with acute tubular necrosis superimposed on stage 3 chronic kidney disease (HCC)   Somnolence   OSA and COPD overlap syndrome (HCC)   Cardiomyopathy, ischemic   Ischemic mitral valve regurgitation   Pulmonary hypertension   Controlled diabetes mellitus type 2 with complications ([place])   Fall at home   Stenosis of left subclavian artery (HCC)  Altered mental status -Likely multifactorial to include COPD exacerbation, NSTEMI, overuse of Narcotic/Benzodiazepine, noncompliance. -Resolved  Acute on Chronic hypoxic respiratory failure -DuoNeb QID -Albuterol PRN -Mucinex DM BID -Flutter valve -Physiotherapy vest QID (refuses to use) -Prednisone 50 mg daily -Titrate O2 to maintain SPO2 > 88% -Complete 5 day  course antibiotics -Respiratory virus panel negative, tracheal aspirate negative,urine negative  OSA and COPD overlap syndrome -CPAP or BiPAP per respiratory: Maintain SPO2> 89%   CAD s/p NSTEMI s/p DES LCX and RCA: DAPT with ASA and Brilinta 12 months -  Has moderate LAD stenosis,    Chronic Systolic and Diastolic CHF/Ischemic Cardiomyopathy -S/P AICD -Daily weight Filed Weights   05/20/16 0500 05/20/16 2135 05/21/16 0545  Weight: 81.7 kg (180 lb 1.9 oz) 82.6 kg (182 lb 1.6 oz) 82.6 kg (182 lb)  -Followed by cardiology outpatient. Significant CAD history. History of EF of 20% in 2009. Last EF of 40-45% with diffuse hypokinesis and grade one diastolic dysfunction in [id number].  On bisoprolol, no ACE-I or ARB in setting of renal insufficiency. Cardiology managing diuretics    Mitral valve regurgitation -Per cardiology has worsened since September 2017 -See NSTEMI   Pulmonary hypertension -See CHF  CAD native artery -see CHF  Acute on CKD stage III(baseline Cr 1.0 to 1.32) Lab Results  Component Value Date   CREATININE 1.42 (H) 05/21/2016   CREATININE 1.27 (H) 05/20/2016   CREATININE 1.45 (H) 05/20/2016  -Appears patient may be euvolemic and her new baseline Cr is at a higher set level. Decrease Lasix to 40 mg daily    Hypokalemia -Potassium goal> 4  Hypomagnesemia -Magnesium goal> 2  Diabetes type 2 controlled with complication -[id number] Hemoglobin A1c= 6.9 Continue Lantus 12 units daily -Resistant SSI  HLD -Lipid panel: Not within ADA guidelines -Lipitor 40 mg daily:     DVT prophylaxis: Heparin gtt Code Status: Full Family Communication: None Disposition Plan: Per cardiology   Consultants:  [facility]  [facility] Card[facility]   Procedures/Significant Events:  11/30 Echocardiogram: LVEF= 25% to 30%. Diffuse hypokinesis. - (grade 2 diastolic dysfunction).  - Ventricular septum: The contour showed diastolic flattening and   systolic flattening. -- Mitral valve:  moderate to severe regurgitation. - Left atrium: moderately dilated. - Tricuspid valve: moderate regurgitation.- Pulmonary arteries:  PA peak pressure: 52 mm Hg (S).    VENTILATOR SETTINGS:     Cultures 11/30 blood NGTD 11/30 respiratory virus panel negative 11/30 urine negative 11/30 urine strep pneumo negative/Legionella pending 12/1 tracheal aspirate normal flora    Antimicrobials: Levofloxacin 11/30>>   Devices    LINES / TUBES:  Midline power wand? 12/3>>    Continuous Infusions: . sodium chloride Stopped (05/20/16 1345)     Objective: Vitals:   05/20/16 2002 05/20/16 2003 05/20/16 2135 05/21/16 0545  BP:   (!) 116/54 (!) 120/58  Pulse:   75 76  Resp:   (!) 25 16  Temp:   97.5 F (36.4 C) 98.1 F (36.7 C)  TempSrc:   Oral Oral  SpO2: 97% 97% 96% 97%  Weight:   82.6 kg (182 lb 1.6 oz) 82.6 kg (182 lb)  Height:   5\' 5"  (1.651 m) 5\' 5"  (1.651 m)    Intake/Output Summary (Last 24 hours) at 05/21/16 1140 Last data filed at 05/21/16 1019  Gross per 24 hour  Intake           502.92 ml  Output             1600 ml  Net         -1097.08 ml   Filed Weights   05/20/16 0500 05/20/16 2135 05/21/16 0545  Weight: 81.7 kg (180 lb 1.9 oz) 82.6 kg (182 lb 1.6 oz) 82.6 kg (182 lb)    Examination:  General: A/O 4, positive acute on chronic respiratory distress Eyes: negative scleral hemorrhage, negative anisocoria, negative icterus ENT: Negative Runny nose, negative gingival bleeding, Neck:  Negative scars, masses, torticollis, lymphadenopathy, JVD Lungs: Clear to auscultation bilateral   Cardiovascular: Regular rhythm and rate without murmur gallop or rub normal S1 and S2 Abdomen: Morbidly obese, negative abdominal pain, nondistended, positive soft, bowel sounds, no rebound, no ascites, no appreciable mass Extremities: No significant cyanosis, clubbing, or edema bilateral lower extremities Skin: Negative rashes, lesions, ulcers Psychiatric:  Unable to  fully assess Central nervous system:  Cranial nerves II through XII intact, tongue/uvula midline, all extremities muscle strength 5/5, sensation intact throughout, negative dysarthria, negative expressive aphasia, negative receptive aphasia    Data Reviewed: Care during the described time interval was provided by me .  I have reviewed this patient's available data, including medical history, events of note, physical examination, and all test results as part of my evaluation. I have personally reviewed and interpreted all radiology studies.  CBC:  Recent Labs Lab 05/15/16 1117  05/18/16 0213 05/19/16 0349 05/20/16 0549 05/20/16 1335 05/21/16 0439  WBC 20.8*  < > 19.9* 24.5* 25.8* 22.6* 21.1*  NEUTROABS 17.9*  --   --  21.3*  --   --   --   HGB 11.6*  < > 11.6* 11.4* 11.7* 12.1 12.5  HCT 36.6  < > 36.5 35.9* 37.4 37.7 39.5  MCV 95.6  < > 95.8 94.7 95.2 94.5 95.4  PLT 293  < > 326 321 342 344 330  < > = values in this interval not displayed. Basic Metabolic Panel:  Recent Labs Lab 05/15/16 1927 05/16/16 0918 05/17/16 0910 05/18/16 1230  05/19/16 0349 05/20/16 0549 05/20/16 1335 05/21/16 0439  NA  --  140 139 139 140 139  --  143  K 3.9 4.1 3.6 4.2 3.9 4.1  --  4.0  CL  --  103 103 104 104 100*  --  101  CO2  --  25 24 23 26 30   --  33*  GLUCOSE  --  185* 108* 218* 113* 114*  --  76  BUN  --  38* 62* 55* 49* 43*  --  32*  CREATININE  --  1.76* 1.84* 1.70* 1.46* 1.45* 1.27* 1.42*  CALCIUM  --  9.3 8.8* 8.8* 9.0 9.3  --  9.2  MG 2.3 2.4 2.3 2.0 2.0  --   --   --    GFR: Estimated Creatinine Clearance: 34.6 mL/min (by C-G formula based on SCr of 1.42 mg/dL (H)). Liver Function Tests: No results for input(s): AST, ALT, ALKPHOS, BILITOT, PROT, ALBUMIN in the last 168 hours. No results for input(s): LIPASE, AMYLASE in the last 168 hours. No results for input(s): AMMONIA in the last 168 hours. Coagulation Profile:  Recent Labs Lab 05/20/16 0549  INR 1.22   Cardiac  Enzymes:  Recent Labs Lab 05/14/16 1208 05/14/16 2134 05/15/16 0248 05/15/16 0926  05/18/16 0213 05/18/16 1231 05/18/16 1831 05/19/16 0349 05/19/16 0924  CKTOTAL 1,073*  --  734* 578*  < > 51 39 29* 33* 35*  TROPONINI 11.34* 9.73* 8.24* 7.76*  --   --   --   --  0.96*  --   < > = values in this interval not displayed. BNP (last 3 results) No results for input(s): PROBNP in the last 8760 hours. HbA1C: No results for input(s): HGBA1C in the last 72 hours. CBG:  Recent Labs Lab 05/20/16 0723 05/20/16 1112 05/20/16 1627 05/20/16 2142 05/21/16 0728  GLUCAP 112* 156* 236* 306* 81   Lipid Profile: No results for input(s): CHOL, HDL, LDLCALC, TRIG, CHOLHDL, LDLDIRECT in the last 72 hours. Thyroid Function Tests: No results for input(s): TSH, T4TOTAL, FREET4, T3FREE, THYROIDAB in the last 72 hours. Anemia Panel: No results for input(s): VITAMINB12, FOLATE, FERRITIN, TIBC, IRON, RETICCTPCT in the last 72 hours. Urine analysis:    Component Value Date/Time   COLORURINE YELLOW 05/14/2016 [place] 05/14/2016 1208   LABSPEC 1.018 05/14/2016 1208   PHURINE 5.0 05/14/2016 1208   GLUCOSEU NEGATIVE 05/14/2016 1208   HGBUR NEGATIVE 05/14/2016 1208   HGBUR negative 01/01/2010 1353   BILIRUBINUR NEGATIVE 05/14/2016 1208   BILIRUBINUR n 05/30/2015 1542   KETONESUR NEGATIVE 05/14/2016 1208   PROTEINUR 30 (A) 05/14/2016 1208   UROBILINOGEN 0.2 05/30/2015 1542   UROBILINOGEN 1.0 11/03/2014 1442   NITRITE NEGATIVE 05/14/2016 1208   LEUKOCYTESUR NEGATIVE 05/14/2016 1208   Sepsis Labs: @LABRCNTIP (procalcitonin:4,lacticidven:4)  ) Recent Results (from the past 240 hour(s))  MRSA PCR Screening     Status: None   Collection Time: 05/15/16 12:34 AM  Result Value Ref Range Status   MRSA by PCR NEGATIVE NEGATIVE Final    Comment:        The GeneXpert MRSA Assay (FDA approved for NASAL specimens only), is one component of a comprehensive MRSA  colonization surveillance program. It is not intended to diagnose MRSA infection nor to guide or monitor treatment for MRSA infections.   Respiratory Panel by PCR     Status: None   Collection Time: 05/15/16  6:03 AM  Result Value Ref Range Status   Adenovirus NOT DETECTED  NOT DETECTED Final   Coronavirus 229E NOT DETECTED NOT DETECTED Final   Coronavirus HKU1 NOT DETECTED NOT DETECTED Final   Coronavirus NL63 NOT DETECTED NOT DETECTED Final   Coronavirus OC43 NOT DETECTED NOT DETECTED Final   Metapneumovirus NOT DETECTED NOT DETECTED Final   Rhinovirus / Enterovirus NOT DETECTED NOT DETECTED Final   Influenza A NOT DETECTED NOT DETECTED Final   Influenza B NOT DETECTED NOT DETECTED Final   Parainfluenza Virus 1 NOT DETECTED NOT DETECTED Final   Parainfluenza Virus 2 NOT DETECTED NOT DETECTED Final   Parainfluenza Virus 3 NOT DETECTED NOT DETECTED Final   Parainfluenza Virus 4 NOT DETECTED NOT DETECTED Final   Respiratory Syncytial Virus NOT DETECTED NOT DETECTED Final   Bordetella pertussis NOT DETECTED NOT DETECTED Final   Chlamydophila pneumoniae NOT DETECTED NOT DETECTED Final   Mycoplasma pneumoniae NOT DETECTED NOT DETECTED Final  Culture, blood (routine x 2)     Status: None   Collection Time: 05/15/16 11:20 AM  Result Value Ref Range Status   Specimen Description BLOOD RIGHT ANTECUBITAL  Final   Special Requests BOTTLES DRAWN AEROBIC AND ANAEROBIC  2CC  Final   Culture NO GROWTH 5 DAYS  Final   Report Status 05/20/2016 FINAL  Final  Culture, blood (routine x 2)     Status: None   Collection Time: 05/15/16 11:31 AM  Result Value Ref Range Status   Specimen Description BLOOD RIGHT HAND  Final   Special Requests BOTTLES DRAWN AEROBIC ONLY  5CC  Final   Culture NO GROWTH 5 DAYS  Final   Report Status 05/20/2016 FINAL  Final  Urine culture     Status: None   Collection Time: 05/15/16 12:30 PM  Result Value Ref Range Status   Specimen Description URINE, CATHETERIZED   Final   Special Requests NONE  Final   Culture NO GROWTH  Final   Report Status 05/16/2016 FINAL  Final  Culture, respiratory (NON-Expectorated)     Status: None   Collection Time: 05/16/16  6:03 AM  Result Value Ref Range Status   Specimen Description TRACHEAL ASPIRATE  Final   Special Requests NONE  Final   Gram Stain   Final    RARE WBC PRESENT,BOTH PMN AND MONONUCLEAR FEW SQUAMOUS EPITHELIAL CELLS PRESENT RARE GRAM POSITIVE COCCI IN PAIRS RARE GRAM NEGATIVE COCCOBACILLI RARE GRAM POSITIVE RODS    Culture Consistent with normal respiratory flora.  Final   Report Status 05/18/2016 FINAL  Final         Radiology Studies: No results found.      Scheduled Meds: . allopurinol  300 mg Oral Daily  . aspirin  324 mg Oral Once  . aspirin  81 mg Oral Daily  . atorvastatin  40 mg Oral q1800  . bisoprolol  5 mg Oral Daily  . budesonide (PULMICORT) nebulizer solution  0.25 mg Nebulization BID  . chlorhexidine  15 mL Mouth Rinse BID  . dextromethorphan-guaiFENesin  1 tablet Oral BID  . DULoxetine  60 mg Oral BID  . heparin  5,000 Units Subcutaneous Q8H  . insulin aspart  0-20 Units Subcutaneous TID WC  . insulin aspart  0-5 Units Subcutaneous QHS  . insulin glargine  12 Units Subcutaneous Daily  . ipratropium-albuterol  3 mL Nebulization TID  . levofloxacin  750 mg Oral Q48H  . mouth rinse  15 mL Mouth Rinse q12n4p  . predniSONE  20 mg Oral Q breakfast  . sodium chloride flush  3 mL Intravenous  Q12H  . ticagrelor  90 mg Oral BID   Continuous Infusions: . sodium chloride Stopped (05/20/16 1345)     LOS: 7 days    Time spent: 66 minutes    Prima Rayner, MD [facility] Pager [phone]   If 7PM-7AM, please contact night-coverage [email or web address] Password [email or web address] 05/21/2016, 11:40 AM

## 2016-05-21 NOTE — Care Management Important Message (Signed)
Important Message  Patient Details  Name: Sydney Phillips MRN: [id number] Date of Birth: [birth year]   Medicare Important Message Given:  Yes    Shamone Winzer [place] 05/21/2016, 9:10 AM

## 2016-05-21 NOTE — Progress Notes (Signed)
Patient Name: Sydney Phillips Date of Encounter: 05/21/2016  Primary Cardiologist: Dr. [facility] Problem List     Active Problems:   Essential hypertension   Coronary atherosclerosis   Hypokalemia   Biventricular implantable cardioverter-defibrillator in situ   NSTEMI (non-ST elevated myocardial infarction) (HCC)   COPD exacerbation (HCC)   Acute on chronic combined systolic and diastolic ACC/AHA stage C congestive heart failure (HCC)   Encephalopathy   CAD in native artery   Acute renal failure with acute tubular necrosis superimposed on stage 3 chronic kidney disease (HCC)   Somnolence   OSA and COPD overlap syndrome (HCC)   Cardiomyopathy, ischemic   Ischemic mitral valve regurgitation   Pulmonary hypertension   Controlled diabetes mellitus type 2 with complications ([place])   Fall at home   Stenosis of left subclavian artery ([place])     Subjective   Feels ok this morning, resting comfortably. Denies chest pain and SOB.   Inpatient Medications    Scheduled Meds:  allopurinol  300 mg Oral Daily   aspirin  324 mg Oral Once   aspirin  81 mg Oral Daily   atorvastatin  40 mg Oral q1800   bisoprolol  5 mg Oral Daily   budesonide (PULMICORT) nebulizer solution  0.25 mg Nebulization BID   chlorhexidine  15 mL Mouth Rinse BID   dextromethorphan-guaiFENesin  1 tablet Oral BID   DULoxetine  60 mg Oral BID   heparin  5,000 Units Subcutaneous Q8H   insulin aspart  0-20 Units Subcutaneous TID WC   insulin aspart  0-5 Units Subcutaneous QHS   insulin glargine  12 Units Subcutaneous Daily   ipratropium-albuterol  3 mL Nebulization QID   levofloxacin  750 mg Oral Q48H   mouth rinse  15 mL Mouth Rinse q12n4p   predniSONE  20 mg Oral Q breakfast   sodium chloride flush  3 mL Intravenous Q12H   ticagrelor  90 mg Oral BID   Continuous Infusions:  sodium chloride Stopped (05/20/16 1345)   PRN Meds: sodium chloride, ALPRAZolam,  HYDROcodone-acetaminophen, levalbuterol, nitroGLYCERIN, ondansetron (ZOFRAN) IV, sodium chloride flush, sodium chloride flush   Vital Signs    Vitals:   05/20/16 2002 05/20/16 2003 05/20/16 2135 05/21/16 0545  BP:   (!) 116/54 (!) 120/58  Pulse:   75 76  Resp:   (!) 25 16  Temp:   97.5 F (36.4 C) 98.1 F (36.7 C)  TempSrc:   Oral Oral  SpO2: 97% 97% 96% 97%  Weight:   182 lb 1.6 oz (82.6 kg) 182 lb (82.6 kg)  Height:   5\' 5"  (1.651 m) 5\' 5"  (1.651 m)    Intake/Output Summary (Last 24 hours) at 05/21/16 0650 Last data filed at 05/21/16 0556  Gross per 24 hour  Intake           694.96 ml  Output             2500 ml  Net         -1805.04 ml   Filed Weights   05/20/16 0500 05/20/16 2135 05/21/16 0545  Weight: 180 lb 1.9 oz (81.7 kg) 182 lb 1.6 oz (82.6 kg) 182 lb (82.6 kg)    Physical Exam   GEN: Well nourished, well developed, in no acute distress.  HEENT: Grossly normal, poor dentition  Neck: Supple, no JVD, carotid bruits, or masses. Cardiac: RRR, no murmurs, rubs, or gallops. No clubbing, cyanosis, edema.  Radials/DP/PT 2+ and equal bilaterally.  Respiratory:  Respirations regular and unlabored, clear to auscultation bilaterally. GI: Soft, nontender, nondistended, BS + x 4. MS: no deformity or atrophy. Skin: warm and dry, no rash. Neuro:  Strength and sensation are intact. Psych: AAOx3.  Normal affect.  Labs    CBC  Recent Labs  05/19/16 0349  05/20/16 1335 05/21/16 0439  WBC 24.5*  < > 22.6* 21.1*  NEUTROABS 21.3*  --   --   --   HGB 11.4*  < > 12.1 12.5  HCT 35.9*  < > 37.7 39.5  MCV 94.7  < > 94.5 95.4  PLT 321  < > 344 330  < > = values in this interval not displayed. Basic Metabolic Panel  Recent Labs  05/18/16 1230 05/19/16 0349 05/20/16 0549 05/20/16 1335 05/21/16 0439  NA 139 140 139  --  143  K 4.2 3.9 4.1  --  4.0  CL 104 104 100*  --  101  CO2 23 26 30   --  33*  GLUCOSE 218* 113* 114*  --  76  BUN 55* 49* 43*  --  32*  CREATININE  1.70* 1.46* 1.45* 1.27* 1.42*  CALCIUM 8.8* 9.0 9.3  --  9.2  MG 2.0 2.0  --   --   --    Cardiac Enzymes  Recent Labs  05/18/16 1831 05/19/16 0349 05/19/16 0924  CKTOTAL 29* 33* 35*  TROPONINI  --  0.96*  --      Telemetry    As BiV paced  - Personally Reviewed    Radiology    No results found.  Cardiac Studies   Diagnostic Diagram     Post-Intervention Diagram     Implants     Permanent Stent  Stent Resolute Onyx3.0x38 [id number] - Implanted    Inventory item: Stent Resolute Onyx3.0x38 Model/Cat number: [id number]  Manufacturer: [place] Lot number: [id number]  Device identifier: [id number] Device identifier type: [id number]  GUDID Information   Request status Successful    Brand name: Resolute Onyx Version/Model: [id number]  Company name: [employer] MRI safety info as of 05/20/16: MR Conditional  Contains dry or latex rubber: No    GMDN P.T. name: Drug-eluting coronary artery stent, non-bioabsorbable-polymer-coated    As of 05/20/2016   Status: Implanted      Stent Resolute Onyx 3.0x30 - [id number] - Implanted    Inventory item: Stent Resolute Onyx 3.0x30 Model/Cat number: [id number]  Manufacturer: [place] Lot number: [id number]  Device identifier: [id number] Device identifier type: GS1  GUDID Information   Request status Successful    Brand name: Resolute Onyx Version/Model: [id number]  Company name: [employer] MRI safety info as of 05/20/16: MR Conditional  Contains dry or latex rubber: No    GMDN P.T. name: Drug-eluting coronary artery stent, non-bioabsorbable-polymer-coated           Patient Profile     Ms. Dechert is a 80 year old female with a past medical history of ischemic/nonischemic cardiomyophathy (mixed etiology) s/p CRT-D, carotid artery disease, COPD, HLD, and DM. She presented to the ED with altered mental status, and ruled in for NSTEMI.    Evidence of acute on chronic CHF by Optivol and confirmed by RHC. Progression of CAD by [facility], now s/p DES to RCA and LCX on 12/5  Assessment & Plan    1. CAD s/p NSTEMI s/p DES LCX and RCA: DAPT with ASA and Brilinta 12 months - consider [facility]. Has moderate LAD stenosis, not far from ostium.  2. Acute on chronic systolic and diastolic HF: Her respiratory failure is felt to be due to a COPD exacerbation, but she also has significant elevation in left heart pressures. Need to enroll in device HF follow up with Sharman Cheek at [place]. On bisoprolol, no ACE-I or ARB in setting of renal insufficiency.  3. Acute respiratory failure/COPD exacerbation: She has had a cough productive of yellow sputum, weakness and worsening dyspnea for several weeks. There was also evidence of Optivol abnormality for a similar duration.  5. Acute renal insufficiency: creatinine 1.42 today, slightly worse after contrast yesterday. Would still diurese today with 40mg  IV BID.   Signed, Arbutus Leas, NP  05/21/2016, 6:50 AM   I have seen and examined the patient along with Arbutus Leas, NP .  I have reviewed the chart, notes and new data.  I agree with NP's note.  Key new complaints: feels substantially better Key examination changes: no overt CHF by exam, no arrhythmia Key new findings / data: BiV paced. Stable renal parameters  PLAN: Continue diuresis today. Plan transition to PO diuretics tomorrow, possible DC in 48 h. Reviewed critical importance of DAPT for minimum 6 months, preferably 12 months.  [name], MD, [place] [phone] 05/21/2016, 10:11 AM

## 2016-05-21 NOTE — Progress Notes (Signed)
The client is very irritable and refusing any type of care this morning including help with eating breakfast, physical assessment X 2, and medications. She has told several staff members including myself, student, and AD to leave her alone and go away. When I asked what was wrong and if I could help her and she stopped talking completely, rolled over and closed her eyes. I paged the NP Erin and she said this was not new for her and that she would be up to see her again later to see if she could get her to take medications and work with [place]. I will continue to monitor the client and assess her needs.   Saddie Benders RN

## 2016-05-21 NOTE — Progress Notes (Signed)
RT NOTE:  Pt refuses CPAP tonight. She wishes to wear [place] instead. RT will monitor

## 2016-05-21 NOTE — Evaluation (Signed)
Physical Therapy Evaluation Patient Details Name: Sydney Phillips MRN: [id number] DOB: [birth year] Today's Date: 05/21/2016   History of Present Illness  80 y.o. female with medical history significant of hypertension, LBBB, CAD, combined heart failure s/p AICD, carotid artery disease s/p left CEA, left subclavian artery stenosis s/p stent, PAD s/p right fem-pop, hyperlipidemia. Patient presented to the ED after being found down by her son for an unknown amount of time. She has had about 3 falls recently. Dx of NSTEMI, s/p cardiac cath 05/20/16, CHF, acute renal insufficiency  Clinical Impression  Pt admitted with above diagnosis. Pt currently with functional limitations due to the deficits listed below (see PT Problem List). SaO2 dropped from 95% on 2 L to 70% on 2L with talking and with transfer from bed to recliner, dyspnea 2/4. HR 70. ST-SNF recommended due to h/o multiple falls recently, and pt is home alone 7:00am-3:00pm. Pt will benefit from skilled PT to increase their independence and safety with mobility to allow discharge to the venue listed below.       Follow Up Recommendations SNF    Equipment Recommendations  None recommended by PT    Recommendations for Other Services       Precautions / Restrictions Precautions Precautions: Fall Precaution Comments: pt reports h/o multiple falls in past year (some were related to "inner ear infection which is better now") Restrictions Weight Bearing Restrictions: No      Mobility  Bed Mobility Overal bed mobility: Modified Independent             General bed mobility comments: HOB up 50*, used bedrail, no physical assist  Transfers Overall transfer level: Needs assistance Equipment used: Rolling walker (2 wheeled) Transfers: Sit to/from [facility] Sit to Stand: Min guard Stand pivot transfers: Min guard       General transfer comment: SaO2 dropped to 70% on 2L O2 when pt was talking in bed and with SPT  to recliner, 2/4 dyspnea, HR 70s  Ambulation/Gait             General Gait Details: NT-pt declined  Stairs            Wheelchair Mobility    Modified Rankin (Stroke Patients Only)       Balance Overall balance assessment: History of Falls;Needs assistance   Sitting balance-Leahy Scale: Good       Standing balance-Leahy Scale: Fair                               Pertinent Vitals/Pain Pain Assessment: No/denies pain    Home Living Family/patient expects to be discharged to:: Private residence Living Arrangements: Children Available Help at Discharge: Available PRN/intermittently Type of Home: House Home Access: Stairs to enter Entrance Stairs-Rails: Right;Left;Can reach both Entrance Stairs-Number of Steps: 2 Home Layout: One level Home Equipment: Walker - 2 wheels;Walker - 4 wheels;Cane - single point;Bedside commode;Tub bench;Grab bars - tub/shower Additional Comments: Son works 4 miles away and can come home in the middle of the day if need be.    Prior Function           Comments: Uses rollator for ambulation, mainly household ambulator, cooks, cleans.      Hand Dominance   Dominant Hand: Right    Extremity/Trunk Assessment   Upper Extremity Assessment: Overall WFL for tasks assessed           Lower Extremity Assessment: Overall WFL for tasks assessed  Cervical / Trunk Assessment: Kyphotic (slouched posture)  Communication   Communication: HOH  Cognition Arousal/Alertness: Awake/alert Behavior During Therapy: Agitated (pt unhappy that PT is starting "so late at night" and that "they're putting all this stuff on me") Overall Cognitive Status: Within Functional Limits for tasks assessed                      General Comments      Exercises     Assessment/Plan    PT Assessment Patient needs continued PT services  PT Problem List Decreased balance;Decreased mobility;Decreased activity  tolerance;Cardiopulmonary status limiting activity          PT Treatment Interventions Gait training;Functional mobility training;DME instruction;Therapeutic activities;Therapeutic exercise;Balance training;Patient/family education    PT Goals (Current goals can be found in the Care Plan section)  Acute Rehab PT Goals Patient Stated Goal: to be able to cook and do laundry for her son PT Goal Formulation: With patient Time For Goal Achievement: 06/04/16 Potential to Achieve Goals: Good    Frequency Min 3X/week   Barriers to discharge Decreased caregiver support son works 1st shift    Co-evaluation               End of Session Equipment Utilized During Treatment: Gait belt;Oxygen Activity Tolerance: Patient tolerated treatment well Patient left: in chair;with call bell/phone within reach;with chair alarm set Nurse Communication: Mobility status         Time: [id number] PT Time Calculation (min) (ACUTE ONLY): 23 min   Charges:   PT Evaluation $PT Eval Low Complexity: 1 Procedure PT Treatments $Therapeutic Activity: 8-22 mins   PT G Codes:        Philomena Doheny 05/21/2016, 12:43 PM [phone]

## 2016-05-22 DIAGNOSIS — E876 Hypokalemia: Secondary | ICD-10-CM

## 2016-05-22 DIAGNOSIS — E118 Type 2 diabetes mellitus with unspecified complications: Secondary | ICD-10-CM

## 2016-05-22 DIAGNOSIS — I272 Pulmonary hypertension, unspecified: Secondary | ICD-10-CM

## 2016-05-22 DIAGNOSIS — I771 Stricture of artery: Secondary | ICD-10-CM

## 2016-05-22 DIAGNOSIS — I10 Essential (primary) hypertension: Secondary | ICD-10-CM

## 2016-05-22 LAB — CBC
HCT: 43.2 % (ref 36.0–46.0)
Hemoglobin: 13.8 g/dL (ref 12.0–15.0)
MCH: 30.5 pg (ref 26.0–34.0)
MCHC: 31.9 g/dL (ref 30.0–36.0)
MCV: 95.4 fL (ref 78.0–100.0)
PLATELETS: 343 10*3/uL (ref 150–400)
RBC: 4.53 MIL/uL (ref 3.87–5.11)
RDW: 15.5 % (ref 11.5–15.5)
WBC: 20.9 10*3/uL — ABNORMAL HIGH (ref 4.0–10.5)

## 2016-05-22 LAB — COMPREHENSIVE METABOLIC PANEL
ALT: 24 U/L (ref 14–54)
ANION GAP: 12 (ref 5–15)
AST: 32 U/L (ref 15–41)
Albumin: 3.2 g/dL — ABNORMAL LOW (ref 3.5–5.0)
Alkaline Phosphatase: 57 U/L (ref 38–126)
BUN: 24 mg/dL — ABNORMAL HIGH (ref 6–20)
CHLORIDE: 99 mmol/L — AB (ref 101–111)
CO2: 30 mmol/L (ref 22–32)
Calcium: 9 mg/dL (ref 8.9–10.3)
Creatinine, Ser: 1.27 mg/dL — ABNORMAL HIGH (ref 0.44–1.00)
GFR calc non Af Amer: 39 mL/min — ABNORMAL LOW (ref >60)
GFR, EST AFRICAN AMERICAN: 46 mL/min — AB (ref >60)
Glucose, Bld: 80 mg/dL (ref 65–99)
POTASSIUM: 3.6 mmol/L (ref 3.5–5.1)
SODIUM: 141 mmol/L (ref 135–145)
Total Bilirubin: 0.9 mg/dL (ref 0.3–1.2)
Total Protein: 5.8 g/dL — ABNORMAL LOW (ref 6.5–8.1)

## 2016-05-22 LAB — GLUCOSE, CAPILLARY
GLUCOSE-CAPILLARY: 91 mg/dL (ref 65–99)
GLUCOSE-CAPILLARY: 234 mg/dL — AB (ref 65–99)
GLUCOSE-CAPILLARY: 158 mg/dL — AB (ref 65–99)

## 2016-05-22 MED ORDER — FUROSEMIDE 40 MG PO TABS
40.0000 mg | ORAL_TABLET | Freq: Two times a day (BID) | ORAL | Status: DC
  Administered 2016-05-22 – 2016-05-23 (×3): 40 mg via ORAL
  Filled 2016-05-22 (×3): qty 1

## 2016-05-22 MED ORDER — LORAZEPAM 2 MG/ML IJ SOLN
1.0000 mg | Freq: Once | INTRAMUSCULAR | Status: AC
  Administered 2016-05-22: 1 mg via INTRAVENOUS
  Filled 2016-05-22: qty 1

## 2016-05-22 MED ORDER — LORAZEPAM 2 MG/ML IJ SOLN: 1.0000 mg | INTRAMUSCULAR | Status: DC | PRN

## 2016-05-22 MED ORDER — ALPRAZOLAM 0.5 MG PO TABS
0.5000 mg | ORAL_TABLET | Freq: Three times a day (TID) | ORAL | Status: DC | PRN
  Administered 2016-05-22 – 2016-05-23 (×3): 0.5 mg via ORAL
  Filled 2016-05-22 (×3): qty 1

## 2016-05-22 NOTE — Progress Notes (Signed)
Patient Name: Sydney Phillips Date of Encounter: 05/22/2016  Primary Cardiologist: Dr. [facility] Problem List     Active Problems:   Essential hypertension   Coronary atherosclerosis   Hypokalemia   Biventricular implantable cardioverter-defibrillator in situ   NSTEMI (non-ST elevated myocardial infarction) (HCC)   COPD exacerbation (HCC)   Acute on chronic combined systolic and diastolic ACC/AHA stage C congestive heart failure (HCC)   Encephalopathy   CAD in native artery   Acute renal failure with acute tubular necrosis superimposed on stage 3 chronic kidney disease (HCC)   Somnolence   OSA and COPD overlap syndrome (HCC)   Cardiomyopathy, ischemic   Ischemic mitral valve regurgitation   Pulmonary hypertension   Controlled diabetes mellitus type 2 with complications (HCC)   Fall at home   Stenosis of left subclavian artery (HCC)     Subjective   Feels well today, denies chest pain and SOB.   Inpatient Medications    Scheduled Meds: . allopurinol  300 mg Oral Daily  . aspirin  324 mg Oral Once  . aspirin  81 mg Oral Daily  . atorvastatin  40 mg Oral q1800  . bisoprolol  5 mg Oral Daily  . budesonide (PULMICORT) nebulizer solution  0.25 mg Nebulization BID  . chlorhexidine  15 mL Mouth Rinse BID  . dextromethorphan-guaiFENesin  1 tablet Oral BID  . DULoxetine  60 mg Oral BID  . furosemide  40 mg Intravenous Q12H  . heparin  5,000 Units Subcutaneous Q8H  . insulin aspart  0-20 Units Subcutaneous TID WC  . insulin aspart  0-5 Units Subcutaneous QHS  . insulin glargine  12 Units Subcutaneous Daily  . ipratropium-albuterol  3 mL Nebulization TID  . levofloxacin  750 mg Oral Q48H  . mouth rinse  15 mL Mouth Rinse q12n4p  . predniSONE  20 mg Oral Q breakfast  . sodium chloride flush  3 mL Intravenous Q12H  . ticagrelor  90 mg Oral BID   Continuous Infusions: . sodium chloride Stopped (05/20/16 1345)   PRN Meds: sodium chloride, ALPRAZolam,  HYDROcodone-acetaminophen, levalbuterol, nitroGLYCERIN, ondansetron (ZOFRAN) IV, sodium chloride flush, sodium chloride flush   Vital Signs    Vitals:   05/21/16 1233 05/21/16 1428 05/21/16 2022 05/22/16 0518  BP:  (!) 113/49 (!) 151/55 126/75  Pulse: 70  67 68  Resp:  18 17   Temp:  98.8 F (37.1 C) 97.4 F (36.3 C) 97.7 F (36.5 C)  TempSrc:  Oral Oral Oral  SpO2: (!) 70% 100% 96% 98%  Weight:    177 lb 6.4 oz (80.5 kg)  Height:        Intake/Output Summary (Last 24 hours) at 05/22/16 0913 Last data filed at 05/22/16 1610  Gross per 24 hour  Intake              120 ml  Output              800 ml  Net             -680 ml   Filed Weights   05/20/16 2135 05/21/16 0545 05/22/16 0518  Weight: 182 lb 1.6 oz (82.6 kg) 182 lb (82.6 kg) 177 lb 6.4 oz (80.5 kg)    Physical Exam   GEN: Well nourished, well developed, in no acute distress.  HEENT: Grossly normal, poor dentition  Neck: Supple, no JVD, carotid bruits, or masses. Cardiac: RRR, no murmurs, rubs, or gallops. No clubbing, cyanosis, edema.  Radials/DP/PT 2+ and equal bilaterally.  Respiratory:  Respirations regular and unlabored, clear to auscultation bilaterally. GI: Soft, nontender, nondistended, BS + x 4. MS: no deformity or atrophy. Skin: warm and dry, no rash. Neuro:  Strength and sensation are intact. Psych: AAOx3.  Normal affect.   Labs    CBC  Recent Labs  05/21/16 0439 05/22/16 0627  WBC 21.1* 20.9*  HGB 12.5 13.8  HCT 39.5 43.2  MCV 95.4 95.4  PLT 330 343   Basic Metabolic Panel  Recent Labs  05/21/16 0439 05/22/16 0627  NA 143 141  K 4.0 3.6  CL 101 99*  CO2 33* 30  GLUCOSE 76 80  BUN 32* 24*  CREATININE 1.42* 1.27*  CALCIUM 9.2 9.0   Liver Function Tests  Recent Labs  05/22/16 0627  AST 32  ALT 24  ALKPHOS 57  BILITOT 0.9  PROT 5.8*  ALBUMIN 3.2*   No results for input(s): LIPASE, AMYLASE in the last 72 hours. Cardiac Enzymes  Recent Labs  05/19/16 0924  CKTOTAL  35*    Telemetry    As BiV  - Personally Reviewed    Radiology    No results found.  Cardiac Studies  Diagnostic Diagram     Post-Intervention Diagram     Implants  Permanent Stent  Stent Resolute Onyx3.0x38 - [id number] - Implanted   Inventory item: Stent Resolute Onyx3.0x38 Model/Cat number: [id number]  Manufacturer: [employer] Lot number: [id number]  Device identifier: [id number] Device identifier type: GS1  GUDID Information   Request status Successful    Brand name: Resolute OnyxT Version/Model: [id number]  Company name: [employer] MRI safety info as of 05/20/16: MR Conditional  Contains dry or latex rubber: No    GMDN P.T. name: Drug-eluting coronary artery stent, non-bioabsorbable-polymer-coated    As of 05/20/2016   Status: Implanted      Stent Resolute Onyx 3.0x30 - [id number] - Implanted   Inventory item: Stent Resolute Onyx 3.0x30 Model/Cat number: [id number]  Manufacturer: [employer] [address] [address] Lot number: [id number]  Device identifier: [id number] Device identifier type: GS1  GUDID Information   Request status Successful    Brand name: Resolute OnyxT Version/Model: [id number]  Company name: [employer] MRI safety info as of 05/20/16: MR Conditional  Contains dry or latex rubber: No    GMDN P.T. name: Drug-eluting coronary artery stent, non-bioabsorbable-polymer-coated            Patient Profile     Sydney Phillips is a 80 year old female with a past medical history of ischemic/nonischemic cardiomyophathy (mixed etiology) s/p CRT-D, carotid artery disease, COPD, HLD, and DM. She presented to the ED with altered mental status, and ruled in for NSTEMI.   Evidence of acute on chronic CHF by Optivol and confirmed by RHC. Progression of CAD by [facility], now s/p DES to RCA and LCX on 12/5  Assessment & Plan    1. CAD s/p NSTEMI s/p DES LCX and RCA: DAPT with ASA and Brilinta 12  months.Has moderate LAD stenosis, not far from ostium.  Patient to increase activity today, spoke with nursing about having her sit up most of the day in the chair.   2. Acute on chronic systolic and diastolic HF: Stable, change to po Lasix today.   3. Acute respiratory failure/COPD exacerbation: Stable, resolved.   5. Acute renal insufficiency: Stable.  Signed, Little Ishikawa, NP  05/22/2016, 9:13 AM    I have seen and examined the patient along with Little Ishikawa, NP .  I have reviewed the chart, notes and new data.  I agree with PA/NP's note.  Key new complaints: explosive emotional outbursts earlier, now calm; from a physical standpoint she has a little right rib cage tenderness from her fall, but no dyspnea at rest and no angina Key examination changes: JVP 3-5 cm, clear lungs, no edema Key new findings / data: creatinine better than yesterday despite prodigious diuresis  PLAN: Switch to PO diuretics. Possible DC in next 24 hours. Will need early f/u (TCM 1 week preferably),  Thurmon Fair, MD, [facility] [phone] 05/22/2016, 1:38 PM

## 2016-05-22 NOTE — Evaluation (Signed)
Occupational Therapy Evaluation Patient Details Name: Sydney Phillips MRN: [id number] DOB: Jun 20, 1935 Today's Date: 05/22/2016    History of Present Illness 80 y.o. female with medical history significant of hypertension, LBBB, CAD, combined heart failure s/p AICD, carotid artery disease s/p left CEA, left subclavian artery stenosis s/p stent, PAD s/p right fem-pop, hyperlipidemia. Patient presented to the ED after being found down by her son for an unknown amount of time. She has had about 3 falls recently. Dx of NSTEMI, s/p cardiac cath 05/20/16, CHF, acute renal insufficiency   Clinical Impression   Pt admitted with above. She demonstrates the below listed deficits and will benefit from continued OT to maximize safety and independence with BADLs.  Pt presents to OT with generalized weakness, impaired cognition, decreased activity tolerance, Rt rib pain.  She is very confused this pm during OT eval - fixated on needing to find her dog and needing to bring her inside - very difficult to redirect.   She requires mod A for ADLs due to pain and min A for functional mobility.  Recommend SNF>       Follow Up Recommendations  SNF    Equipment Recommendations  None recommended by OT    Recommendations for Other Services       Precautions / Restrictions Precautions Precautions: Fall      Mobility Bed Mobility                  Transfers Overall transfer level: Needs assistance Equipment used: Rolling walker (2 wheeled) Transfers: Sit to/from [facility] Sit to Stand: Min assist Stand pivot transfers: Min assist       General transfer comment: Min A to move into standing and for balance due to pain     Balance Overall balance assessment: History of Falls   Sitting balance-Leahy Scale: Good       Standing balance-Leahy Scale: Fair                              ADL Overall ADL's : Needs assistance/impaired Eating/Feeding:  Independent   Grooming: Wash/dry hands;Oral care;Wash/dry face;Brushing hair;Minimal assistance;Sitting Grooming Details (indicate cue type and reason): soreness Rt ribs - pt needs significant coaxing  Upper Body Bathing: Minimal assistance;Sitting   Lower Body Bathing: Moderate assistance;Sit to/from stand   Upper Body Dressing : Minimal assistance;Sitting   Lower Body Dressing: Moderate assistance;Sit to/from stand   Toilet Transfer: Minimal assistance;Ambulation;Comfort height toilet;BSC;RW   Toileting- Clothing Manipulation and Hygiene: Moderate assistance;Sit to/from stand       Functional mobility during ADLs: Minimal assistance;Rolling walker General ADL Comments: Pt requires assist due to pain.  Pt becomes agitated if encouraged to participate      Vision     Perception     Praxis      Pertinent Vitals/Pain Pain Assessment: Faces Faces Pain Scale: Hurts even more Pain Location: Rt ribs with ADL activity  Pain Descriptors / Indicators: Grimacing;Guarding     Hand Dominance Right   Extremity/Trunk Assessment Upper Extremity Assessment Upper Extremity Assessment: Generalized weakness   Lower Extremity Assessment Lower Extremity Assessment: Defer to PT evaluation   Cervical / Trunk Assessment Cervical / Trunk Assessment: Kyphotic   Communication Communication Communication: HOH   Cognition Arousal/Alertness: Awake/alert Behavior During Therapy: Anxious Overall Cognitive Status: Impaired/Different from baseline Area of Impairment: Orientation;Attention;Problem solving;Safety/judgement Orientation Level: Disoriented to;Place Current Attention Level: Selective (with cues )     Safety/Judgement:  Decreased awareness of safety;Decreased awareness of deficits   Problem Solving: Difficulty sequencing;Requires verbal cues General Comments: Pt confused and fixated on needing to go find her dog, and that her dog needs to come inside now.    General Comments        Exercises       Shoulder Instructions      Home Living Family/patient expects to be discharged to:: Skilled nursing facility                                 Additional Comments: Son works 4 miles away and can come home in the middle of the day if need be.      Prior Functioning/Environment Level of Independence: Independent with assistive device(s)        Comments: Uses rollator for ambulation, mainly household ambulator, cooks, cleans.         OT Problem List: Decreased strength;Decreased activity tolerance;Impaired balance (sitting and/or standing);Decreased safety awareness;Decreased knowledge of use of DME or AE;Pain   OT Treatment/Interventions: Self-care/ADL training;DME and/or AE instruction;Therapeutic activities;Cognitive remediation/compensation;Patient/family education;Balance training    OT Goals(Current goals can be found in the care plan section) Acute Rehab OT Goals Patient Stated Goal: "I need to find my dog right now" OT Goal Formulation: With patient Time For Goal Achievement: 06/05/16 Potential to Achieve Goals: Good ADL Goals Pt Will Perform Grooming: with min guard assist;standing Pt Will Perform Upper Body Bathing: with set-up;sitting Pt Will Perform Lower Body Bathing: with min guard assist;sit to/from stand Pt Will Perform Upper Body Dressing: with set-up;sitting Pt Will Perform Lower Body Dressing: with min guard assist;sit to/from stand Pt Will Transfer to Toilet: with min guard assist;ambulating;regular height toilet;bedside commode;grab bars Pt Will Perform Toileting - Clothing Manipulation and hygiene: with min guard assist;sit to/from stand  OT Frequency: Min 2X/week   Barriers to D/C: Decreased caregiver support          Co-evaluation              End of Session Equipment Utilized During Treatment: Rolling walker;Gait belt Nurse Communication: Mobility status  Activity Tolerance: Patient limited by  pain Patient left: in chair;with call bell/phone within reach;with chair alarm set   Time: 1650-1704 OT Time Calculation (min): 14 min Charges:  OT General Charges $OT Visit: 1 Procedure OT Evaluation $OT Eval Moderate Complexity: 1 Procedure G-Codes:    Osby Sweetin M 2016/06/02, 5:57 PM

## 2016-05-22 NOTE — Clinical Social Work Placement (Signed)
   CLINICAL SOCIAL WORK PLACEMENT  NOTE  Date:  05/22/2016  Patient Details  Name: MAGENTA KRAUTER MRN: [id number] Date of Birth: Jun 11, 1936  Clinical Social Work is seeking post-discharge placement for this patient at the [place] level of care (*CSW will initial, date and re-position this form in  chart as items are completed):  Yes   Patient/family provided with [facility]'s list of facilities offering this level of care within the geographic area requested by the patient (or if unable, by the patient's family).  Yes   Patient/family informed of their freedom to choose among providers that offer the needed level of care, that participate in Medicare, Medicaid or managed care program needed by the patient, have an available bed and are willing to accept the patient.  Yes   Patient/family informed of [facility]'s ownership interest in [facility] and [facility], as well as of the fact that they are under no obligation to receive care at these facilities.  PASRR submitted to EDS on 05/22/16     PASRR number received on 05/22/16     Existing PASRR number confirmed on       FL2 transmitted to all facilities in geographic area requested by pt/family on 05/22/16     FL2 transmitted to all facilities within larger geographic area on       Patient informed that his/her managed care company has contracts with or will negotiate with certain facilities, including the following:            Patient/family informed of bed offers received.  Patient chooses bed at       Physician recommends and patient chooses bed at      Patient to be transferred to   on  .  Patient to be transferred to facility by       Patient family notified on   of transfer.  Name of family member notified:        PHYSICIAN Please sign FL2     Additional Comment:   Barbette Or, [place]

## 2016-05-22 NOTE — Care Management Note (Signed)
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 In with Resp Failure and AMS- Ruled in for Nstemi and S/p LHC-s/p stent. Initiated on IV Lasix post cath. PT/OT Recommended SNF once stable.                   Action/Plan: CSW to assess for disposition needs. CM will continue to monitor.   Expected Discharge Date:   (UNKNOWN)               Expected Discharge Plan:  Skilled Nursing Facility  In-House Referral:  Clinical Social Work  Discharge planning Services  CM Consult  Post Acute Care Choice:  Resumption of Svcs/PTA Provider Choice offered to:     DME Arranged:  N/A DME Agency:  NA  HH Arranged:  NA HH Agency:  [facility]  Status of Service:  Completed, signed off  If discussed at [employer], dates discussed:    Additional Comments:  Bethena Roys, RN 05/22/2016, 11:16 AM

## 2016-05-22 NOTE — Progress Notes (Signed)
PROGRESS NOTE    Sydney Phillips  [id number] DOB: 08-30-1935 DOA: 05/14/2016 PCP: Laurey Morale, MD   Brief Narrative:  80 y.o. WF PMHx Depression with Anxiety,Fibromyalgia, HTN, MI, LBBB, CAD, Chronic Systolic and Diastolic CHF s/p AICD, carotid artery disease s/p left CEA, left subclavian artery stenosis s/p stent, PAD s/p right fem-pop, COPD on 2 L O2 at home, OSA, hyperlipidemia. Chronic pain syndrome, Diabetes mellitus without complication   Patient presented to the ED after being found down by her son for an unknown amount of time. She has had about 3 falls recently thought to be secondary to medications. She is not sure of why she fell. She reports some associated right sided chest wall pain.    Subjective:  Tearful, wants to go home to her dog,RN called by the bedside , talked to the son , he will convince mom to stay until it is safe to DC to SNF  Assessment & Plan:   Active Problems:   Essential hypertension   Coronary atherosclerosis   Hypokalemia   Biventricular implantable cardioverter-defibrillator in situ   NSTEMI (non-ST elevated myocardial infarction) (HCC)   COPD exacerbation (HCC)   Acute on chronic combined systolic and diastolic ACC/AHA stage C congestive heart failure (HCC)   Encephalopathy   CAD in native artery   Acute renal failure with acute tubular necrosis superimposed on stage 3 chronic kidney disease (HCC)   Somnolence   OSA and COPD overlap syndrome (HCC)   Cardiomyopathy, ischemic   Ischemic mitral valve regurgitation   Pulmonary hypertension   Controlled diabetes mellitus type 2 with complications ([place])   Fall at home   Stenosis of left subclavian artery (HCC)    Altered mental status-resolved -Likely multifactorial to include COPD exacerbation, NSTEMI, overuse of Narcotic/Benzodiazepine, noncompliance. -Resolved  Acute on Chronic hypoxic respiratory failure -DuoNeb QID -Albuterol PRN -Mucinex DM BID -Flutter  valve -Physiotherapy vest QID (refuses to use) -Continue to taper steroids -Titrate O2 to maintain SPO2 > 88% -Completed 5 day course antibiotics -Respiratory virus panel negative, tracheal aspirate negative,urine negative  OSA and COPD overlap syndrome -CPAP or BiPAP per respiratory: Maintain SPO2> 89%   CAD s/p NSTEMI s/p DES LCX and [id number] was 11 with total CK 1000 on arrival  Status postcardiac cath, now on  DAPT with ASA and Brilinta 12 months -  Has moderate LAD stenosis,    Chronic Systolic and Diastolic CHF/Ischemic Cardiomyopathy h/o mixed cardiomyopathy s/p Medtronic CRT-D (original ICD 2009, s/p device change out 2013)    History of EF of 20% in 2009.           - ejection fraction has improved based on echocardiogram in 2013 2015, however echocardiogram obtained in September shows her ejection fraction went down again to 40-45%.with diffuse hypokinesis and grade one diastolic dysfunction in [id number].   Filed Weights   05/20/16 2135 05/21/16 0545 05/22/16 0518  Weight: 82.6 kg (182 lb 1.6 oz) 82.6 kg (182 lb) 80.5 kg (177 lb 6.4 oz)  On bisoprolol, no ACE-I or ARB in setting of renal insufficiency. Cardiology managing diuretics    Mitral valve regurgitation -Per cardiology has worsened since September 2017 -See NSTEMI   Pulmonary hypertension -See CHF  CAD native artery -see CHF  Acute on CKD stage III(baseline Cr 1.0 to 1.32) Lab Results  Component Value Date   CREATININE 1.27 (H) 05/22/2016   CREATININE 1.42 (H) 05/21/2016   CREATININE 1.27 (H) 05/20/2016  -Appears patient may be euvolemic and her new baseline Cr  is at a higher set level. Decrease Lasix to 40 mg daily    Hypokalemia -Potassium goal> 4  Hypomagnesemia -Magnesium goal> 2  Diabetes type 2 controlled with complication -[id number] Hemoglobin A1c= 6.9 Continue Lantus 12 units daily -Resistant SSI  HLD -Lipid panel: Not within ADA guidelines -Lipitor 40 mg daily:     DVT  prophylaxis: Heparin gtt Code Status: Full Family Communication: None Disposition Plan:  Per cardiology, pending improvement of oxygen requirements   Consultants:  [facility]   Procedures/Significant Events:  11/30 Echocardiogram: LVEF= 25% to 30%. Diffuse hypokinesis. - (grade 2 diastolic dysfunction).  - Ventricular septum: The contour showed diastolic flattening and   systolic flattening. -- Mitral valve: moderate to severe regurgitation. - Left atrium: moderately dilated. - Tricuspid valve: moderate regurgitation.- Pulmonary arteries:  PA peak pressure: 52 mm Hg (S).    VENTILATOR SETTINGS:     Cultures 11/30 blood NGTD 11/30 respiratory virus panel negative 11/30 urine negative 11/30 urine strep pneumo negative/Legionella pending 12/1 tracheal aspirate normal flora    Antimicrobials: Levofloxacin 11/30>12/4    Devices    LINES / TUBES:  Midline power wand? 12/3>>    Continuous Infusions: . sodium chloride Stopped (05/20/16 1345)     Objective: Vitals:   05/21/16 1233 05/21/16 1428 05/21/16 2022 05/22/16 0518  BP:  (!) 113/49 (!) 151/55 126/75  Pulse: 70  67 68  Resp:  18 17   Temp:  98.8 F (37.1 C) 97.4 F (36.3 C) 97.7 F (36.5 C)  TempSrc:  Oral Oral Oral  SpO2: (!) 70% 100% 96% 98%  Weight:    80.5 kg (177 lb 6.4 oz)  Height:        Intake/Output Summary (Last 24 hours) at 05/22/16 [id number] Last data filed at 05/22/16 [id number]  Gross per 24 hour  Intake              342 ml  Output              800 ml  Net             -458 ml   Filed Weights   05/20/16 2135 05/21/16 0545 05/22/16 0518  Weight: 82.6 kg (182 lb 1.6 oz) 82.6 kg (182 lb) 80.5 kg (177 lb 6.4 oz)    Examination:  General: A/O 4, positive acute on chronic respiratory distress Eyes: negative scleral hemorrhage, negative anisocoria, negative icterus ENT: Negative Runny nose, negative gingival bleeding, Neck:  Negative scars, masses, torticollis, lymphadenopathy,  JVD Lungs: Clear to auscultation bilateral   Cardiovascular: Regular rhythm and rate without murmur gallop or rub normal S1 and S2 Abdomen: Morbidly obese, negative abdominal pain, nondistended, positive soft, bowel sounds, no rebound, no ascites, no appreciable mass Extremities: No significant cyanosis, clubbing, or edema bilateral lower extremities Skin: Negative rashes, lesions, ulcers Psychiatric:  Unable to fully assess Central nervous system:  Cranial nerves II through XII intact, tongue/uvula midline, all extremities muscle strength 5/5, sensation intact throughout, negative dysarthria, negative expressive aphasia, negative receptive aphasia    Data Reviewed: Care during the described time interval was provided by me .  I have reviewed this patient's available data, including medical history, events of note, physical examination, and all test results as part of my evaluation. I have personally reviewed and interpreted all radiology studies.  CBC:  Recent Labs Lab 05/15/16 1117  05/19/16 0349 05/20/16 0549 05/20/16 1335 05/21/16 0439 05/22/16 0627  WBC 20.8*  < > 24.5* 25.8* 22.6* 21.1* 20.9*  NEUTROABS  17.9*  --  21.3*  --   --   --   --   HGB 11.6*  < > 11.4* 11.7* 12.1 12.5 13.8  HCT 36.6  < > 35.9* 37.4 37.7 39.5 43.2  MCV 95.6  < > 94.7 95.2 94.5 95.4 95.4  PLT 293  < > 321 342 344 330 343  < > = values in this interval not displayed. Basic Metabolic Panel:  Recent Labs Lab 05/15/16 1927  05/16/16 0918 05/17/16 0910 05/18/16 1230 05/19/16 0349 05/20/16 0549 05/20/16 1335 05/21/16 0439 05/22/16 0627  NA  --   < > 140 139 139 140 139  --  143 141  K 3.9  --  4.1 3.6 4.2 3.9 4.1  --  4.0 3.6  CL  --   < > 103 103 104 104 100*  --  101 99*  CO2  --   < > 25 24 23 26 30   --  33* 30  GLUCOSE  --   < > 185* 108* 218* 113* 114*  --  76 80  BUN  --   < > 38* 62* 55* 49* 43*  --  32* 24*  CREATININE  --   < > 1.76* 1.84* 1.70* 1.46* 1.45* 1.27* 1.42* 1.27*  CALCIUM   --   < > 9.3 8.8* 8.8* 9.0 9.3  --  9.2 9.0  MG 2.3  --  2.4 2.3 2.0 2.0  --   --   --   --   < > = values in this interval not displayed. GFR: Estimated Creatinine Clearance: 38.3 mL/min (by C-G formula based on SCr of 1.27 mg/dL (H)). Liver Function Tests:  Recent Labs Lab 05/22/16 0627  AST 32  ALT 24  ALKPHOS 57  BILITOT 0.9  PROT 5.8*  ALBUMIN 3.2*   No results for input(s): LIPASE, AMYLASE in the last 168 hours. No results for input(s): AMMONIA in the last 168 hours. Coagulation Profile:  Recent Labs Lab 05/20/16 0549  INR 1.22   Cardiac Enzymes:  Recent Labs Lab 05/15/16 0926  05/18/16 0213 05/18/16 1231 05/18/16 1831 05/19/16 0349 05/19/16 0924  CKTOTAL 578*  < > 51 39 29* 33* 35*  TROPONINI 7.76*  --   --   --   --  0.96*  --   < > = values in this interval not displayed. BNP (last 3 results) No results for input(s): PROBNP in the last 8760 hours. HbA1C: No results for input(s): HGBA1C in the last 72 hours. CBG:  Recent Labs Lab 05/20/16 1627 05/20/16 2142 05/21/16 0728 05/21/16 2030 05/22/16 0732  GLUCAP 236* 306* 81 152* 91   Lipid Profile: No results for input(s): CHOL, HDL, LDLCALC, TRIG, CHOLHDL, LDLDIRECT in the last 72 hours. Thyroid Function Tests: No results for input(s): TSH, T4TOTAL, FREET4, T3FREE, THYROIDAB in the last 72 hours. Anemia Panel: No results for input(s): VITAMINB12, FOLATE, FERRITIN, TIBC, IRON, RETICCTPCT in the last 72 hours. Urine analysis:    Component Value Date/Time   COLORURINE YELLOW 05/14/2016 [place] 05/14/2016 1208   LABSPEC 1.018 05/14/2016 1208   PHURINE 5.0 05/14/2016 1208   GLUCOSEU NEGATIVE 05/14/2016 1208   HGBUR NEGATIVE 05/14/2016 1208   HGBUR negative 01/01/2010 1353   BILIRUBINUR NEGATIVE 05/14/2016 1208   BILIRUBINUR n 05/30/2015 1542   KETONESUR NEGATIVE 05/14/2016 1208   PROTEINUR 30 (A) 05/14/2016 1208   UROBILINOGEN 0.2 05/30/2015 1542   UROBILINOGEN 1.0 11/03/2014  1442   NITRITE NEGATIVE  05/14/2016 [place] 05/14/2016 1208   Sepsis Labs: @LABRCNTIP (procalcitonin:4,lacticidven:4)  ) Recent Results (from the past 240 hour(s))  MRSA PCR Screening     Status: None   Collection Time: 05/15/16 12:34 AM  Result Value Ref Range Status   MRSA by PCR NEGATIVE NEGATIVE Final    Comment:        The GeneXpert MRSA Assay (FDA approved for NASAL specimens only), is one component of a comprehensive MRSA colonization surveillance program. It is not intended to diagnose MRSA infection nor to guide or monitor treatment for MRSA infections.   Respiratory Panel by PCR     Status: None   Collection Time: 05/15/16  6:03 AM  Result Value Ref Range Status   Adenovirus NOT DETECTED NOT DETECTED Final   Coronavirus 229E NOT DETECTED NOT DETECTED Final   Coronavirus HKU1 NOT DETECTED NOT DETECTED Final   Coronavirus NL63 NOT DETECTED NOT DETECTED Final   Coronavirus OC43 NOT DETECTED NOT DETECTED Final   Metapneumovirus NOT DETECTED NOT DETECTED Final   Rhinovirus / Enterovirus NOT DETECTED NOT DETECTED Final   Influenza A NOT DETECTED NOT DETECTED Final   Influenza B NOT DETECTED NOT DETECTED Final   Parainfluenza Virus 1 NOT DETECTED NOT DETECTED Final   Parainfluenza Virus 2 NOT DETECTED NOT DETECTED Final   Parainfluenza Virus 3 NOT DETECTED NOT DETECTED Final   Parainfluenza Virus 4 NOT DETECTED NOT DETECTED Final   Respiratory Syncytial Virus NOT DETECTED NOT DETECTED Final   Bordetella pertussis NOT DETECTED NOT DETECTED Final   Chlamydophila pneumoniae NOT DETECTED NOT DETECTED Final   Mycoplasma pneumoniae NOT DETECTED NOT DETECTED Final  Culture, blood (routine x 2)     Status: None   Collection Time: 05/15/16 11:20 AM  Result Value Ref Range Status   Specimen Description BLOOD RIGHT ANTECUBITAL  Final   Special Requests BOTTLES DRAWN AEROBIC AND ANAEROBIC  2CC  Final   Culture NO GROWTH 5 DAYS  Final   Report Status  05/20/2016 FINAL  Final  Culture, blood (routine x 2)     Status: None   Collection Time: 05/15/16 11:31 AM  Result Value Ref Range Status   Specimen Description BLOOD RIGHT HAND  Final   Special Requests BOTTLES DRAWN AEROBIC ONLY  5CC  Final   Culture NO GROWTH 5 DAYS  Final   Report Status 05/20/2016 FINAL  Final  Urine culture     Status: None   Collection Time: 05/15/16 12:30 PM  Result Value Ref Range Status   Specimen Description URINE, CATHETERIZED  Final   Special Requests NONE  Final   Culture NO GROWTH  Final   Report Status 05/16/2016 FINAL  Final  Culture, respiratory (NON-Expectorated)     Status: None   Collection Time: 05/16/16  6:03 AM  Result Value Ref Range Status   Specimen Description TRACHEAL ASPIRATE  Final   Special Requests NONE  Final   Gram Stain   Final    RARE WBC PRESENT,BOTH PMN AND MONONUCLEAR FEW SQUAMOUS EPITHELIAL CELLS PRESENT RARE GRAM POSITIVE COCCI IN PAIRS RARE GRAM NEGATIVE COCCOBACILLI RARE GRAM POSITIVE RODS    Culture Consistent with normal respiratory flora.  Final   Report Status 05/18/2016 FINAL  Final         Radiology Studies: No results found.      Scheduled Meds: . allopurinol  300 mg Oral Daily  . aspirin  324 mg Oral Once  . aspirin  81 mg Oral Daily  .  atorvastatin  40 mg Oral q1800  . bisoprolol  5 mg Oral Daily  . budesonide (PULMICORT) nebulizer solution  0.25 mg Nebulization BID  . chlorhexidine  15 mL Mouth Rinse BID  . dextromethorphan-guaiFENesin  1 tablet Oral BID  . DULoxetine  60 mg Oral BID  . furosemide  40 mg Intravenous Q12H  . heparin  5,000 Units Subcutaneous Q8H  . insulin aspart  0-20 Units Subcutaneous TID WC  . insulin aspart  0-5 Units Subcutaneous QHS  . insulin glargine  12 Units Subcutaneous Daily  . ipratropium-albuterol  3 mL Nebulization TID  . levofloxacin  750 mg Oral Q48H  . mouth rinse  15 mL Mouth Rinse q12n4p  . predniSONE  20 mg Oral Q breakfast  . sodium chloride flush   3 mL Intravenous Q12H  . ticagrelor  90 mg Oral BID   Continuous Infusions: . sodium chloride Stopped (05/20/16 1345)     LOS: 8 days    Time spent: 17 minutes    Sussan Meter, MD [facility] Pager [phone]   If 7PM-7AM, please contact night-coverage [email or web address] Password [email or web address] 05/22/2016, 8:52 AM

## 2016-05-22 NOTE — [place] FL2 (Signed)
[place] MEDICAID FL2 LEVEL OF CARE SCREENING TOOL     IDENTIFICATION  Patient Name: Sydney Phillips Birthdate: March 22, 1936 Sex: female Admission Date (Current Location): 05/14/2016  [facility] [place] Number:  Herbalist and Address:  [facility] [place]. [place] [facility], [place] [address] [place], [place] [address]      Provider Number: [id number]  Attending Physician Name and Address:  Reyne Dumas, MD  Relative Name and Phone Number:       Current Level of Care: Hospital Recommended Level of Care: [place] Prior Approval Number:    Date Approved/Denied:   PASRR Number:    Discharge Plan: SNF    Current Diagnoses: Patient Active Problem List   Diagnosis Date Noted  . Stenosis of left subclavian artery ([place]) 05/19/2016  . Fall at home   . Somnolence   . OSA and COPD overlap syndrome ([place])   . Cardiomyopathy, ischemic   . Ischemic mitral valve regurgitation   . Pulmonary hypertension   . Controlled diabetes mellitus type 2 with complications ([place])   . Encephalopathy   . CAD in native artery   . Acute renal failure with acute tubular necrosis superimposed on stage 3 chronic kidney disease ([place])   . COPD exacerbation ([place]) 05/14/2016  . Acute on chronic combined systolic and diastolic ACC/AHA stage C congestive heart failure ([place]) 05/14/2016  . Pleural effusion   . Acute on chronic respiratory failure with hypoxia ([place])   . CHF exacerbation ([place]) 04/06/2016  . Acute respiratory distress 04/06/2016  . Altered mental state 02/26/2016  . Syncopal episodes 02/26/2016  . Acute kidney injury ([place]) 02/26/2016  . Skin lesion of back/upper right shoulder 02/26/2016  . Cardiomyopathy ([place]) 11/07/2015  . Smoking 11/07/2015  . Arthralgia 10/12/2015  . Left carotid stenosis 11/08/2014  . Diabetes mellitus without complication ([place]) [id number]  . NSTEMI (non-ST elevated myocardial infarction) ([place]) 01/20/2014  . Melena 01/20/2014  . Microcytic  anemia 01/19/2014  . Chest pain 01/19/2014  . PAD (peripheral artery disease) ([place]) 10/12/2013  . S/P angioplasty with stent, lt. subclavian 07/31/11 08/01/2011  . Biventricular implantable cardioverter-defibrillator in situ 07/28/2011  . PVD, known severe, (previuosly asymptomatic) LSCA disease, now with "high grade" RICA disease. 07/28/2011  . Bronchitis, recent flare 07/28/2011  . Syncope,possible related to subclavian steal syndrome 07/27/2011  . Vertigo 07/27/2011  . Hypokalemia 07/27/2011  . SPINAL STENOSIS 01/28/2010  . CONSTIPATION 01/14/2010  . BACK PAIN, LUMBAR 01/01/2010  . INSOMNIA 01/23/2009  . ALLERGIC RHINITIS 11/10/2008  . Coronary atherosclerosis 06/21/2008  . HIP PAIN, BILATERAL 06/21/2008  . Myalgia and myositis 11/26/2007  . HLD (hyperlipidemia) 07/28/2007  . ACUTE SINUSITIS, UNSPECIFIED 07/28/2007  . WEIGHT GAIN 06/04/2007  . DYSPNEA 06/04/2007  . COPD (chronic obstructive pulmonary disease) with emphysema ([place]) 03/02/2007  . Depression 02/25/2007  . Essential hypertension 02/25/2007  . GERD 02/25/2007  . COLONIC POLYPS, HX OF 02/25/2007    Orientation RESPIRATION BLADDER Height & Weight     Self, Time, Situation, Place  O2 (2L) Incontinent Weight: 177 lb 6.4 oz (80.5 kg) Height:  5\' 5"  (165.1 cm)  BEHAVIORAL SYMPTOMS/MOOD NEUROLOGICAL BOWEL NUTRITION STATUS      Incontinent Diet (Carb Modified/Heart Healthy/Thin Liquids)  AMBULATORY STATUS COMMUNICATION OF NEEDS Skin   Extensive Assist Verbally Normal                       Personal Care Assistance Level of Assistance  Bathing, Feeding, Dressing Bathing Assistance: Limited  assistance Feeding assistance: Independent Dressing Assistance: Limited assistance     Functional Limitations Info  Sight, Hearing, Speech Sight Info: Adequate Hearing Info: Impaired Speech Info: Adequate    SPECIAL CARE FACTORS FREQUENCY  PT (By licensed PT), OT (By licensed OT)     PT Frequency: 3x/week OT  Frequency: 3x/week            Contractures Contractures Info: Not present    Additional Factors Info  Code Status, Allergies, Psychotropic, Insulin Sliding Scale Code Status Info: Full Code Allergies Info: Potassium-containing Compounds, Azithromycin, Codeine, Darvon, Erythromycin, Meloxicam, Norco Hydrocodone-acetaminophen, Penicillins, Propoxyphene N-acetaminophen, Rofecoxib, Rosuvastatin, Statins, Sulfa Antibiotics Psychotropic Info: Cymbalta / Xanax Insulin Sliding Scale Info: Novolog daily with meals and bedtime       Current Medications (05/22/2016):  This is the current hospital active medication list Current Facility-Administered Medications  Medication Dose Route Frequency Provider Last Rate Last Dose  . 0.9 %  sodium chloride infusion  250 mL Intravenous PRN Sherren Mocha, MD      . 0.9 %  sodium chloride infusion   Intravenous Continuous Sherren Mocha, MD   Stopped at 05/20/16 1345  . allopurinol (ZYLOPRIM) tablet 300 mg  300 mg Oral Daily Mariel Aloe, MD   300 mg at 05/22/16 0829  . ALPRAZolam Duanne Moron) tablet 0.5 mg  0.5 mg Oral TID PRN Arbutus Leas, NP   0.5 mg at 05/22/16 1254  . aspirin chewable tablet 324 mg  324 mg Oral Once Varney Biles, MD      . aspirin chewable tablet 81 mg  81 mg Oral Daily Sherren Mocha, MD   81 mg at 05/22/16 0830  . atorvastatin (LIPITOR) tablet 40 mg  40 mg Oral q1800 Allie Bossier, MD   40 mg at 05/21/16 1757  . bisoprolol (ZEBETA) tablet 5 mg  5 mg Oral Daily Cherene Altes, MD   5 mg at 05/22/16 0829  . budesonide (PULMICORT) nebulizer solution 0.25 mg  0.25 mg Nebulization BID Cherene Altes, MD   0.25 mg at 05/20/16 2003  . chlorhexidine (PERIDEX) 0.12 % solution 15 mL  15 mL Mouth Rinse BID Allie Bossier, MD   15 mL at 05/21/16 2110  . dextromethorphan-guaiFENesin (MUCINEX DM) 30-600 MG per 12 hr tablet 1 tablet  1 tablet Oral BID Allie Bossier, MD   1 tablet at 05/22/16 [phone]  . DULoxetine (CYMBALTA) DR capsule 60 mg  60  mg Oral BID Mariel Aloe, MD   60 mg at 05/22/16 [id number]  . furosemide (LASIX) tablet 40 mg  40 mg Oral BID Arbutus Leas, NP      . heparin injection 5,000 Units  5,000 Units Subcutaneous Q8H Sherren Mocha, MD   5,000 Units at 05/22/16 1502  . HYDROcodone-acetaminophen (NORCO) 10-325 MG per tablet 1 tablet  1 tablet Oral Q8H PRN Donita Brooks, NP   1 tablet at 05/21/16 1806  . insulin aspart (novoLOG) injection 0-20 Units  0-20 Units Subcutaneous TID WC Cherene Altes, MD   3 Units at 05/21/16 1758  . insulin aspart (novoLOG) injection 0-5 Units  0-5 Units Subcutaneous QHS Cherene Altes, MD   4 Units at 05/20/16 2242  . insulin glargine (LANTUS) injection 12 Units  12 Units Subcutaneous Daily Allie Bossier, MD   12 Units at 05/22/16 0830  . ipratropium-albuterol (DUONEB) 0.5-2.5 (3) MG/3ML nebulizer solution 3 mL  3 mL Nebulization TID Reyne Dumas, MD   3 mL at 05/22/16  1431  . levalbuterol (XOPENEX) nebulizer solution 1.25 mg  1.25 mg Nebulization Q2H PRN Toy Baker, MD      . levofloxacin (LEVAQUIN) tablet 750 mg  750 mg Oral Q48H Reyne Dumas, MD   750 mg at 05/21/16 1122  . LORazepam (ATIVAN) injection 1 mg  1 mg Intravenous Q4H PRN Arbutus Leas, NP      . MEDLINE mouth rinse  15 mL Mouth Rinse q12n4p Allie Bossier, MD   15 mL at 05/21/16 1122  . nitroGLYCERIN (NITROSTAT) SL tablet 0.4 mg  0.4 mg Sublingual Q5 Min x 3 PRN Mariel Aloe, MD      . ondansetron [facility]) injection 4 mg  4 mg Intravenous Q6H PRN Mariel Aloe, MD      . predniSONE (DELTASONE) tablet 20 mg  20 mg Oral Q breakfast Cherene Altes, MD   20 mg at 05/22/16 [phone]  . sodium chloride flush (NS) 0.9 % injection 10-40 mL  10-40 mL Intracatheter PRN Allie Bossier, MD   10 mL at 05/21/16 0445  . sodium chloride flush (NS) 0.9 % injection 3 mL  3 mL Intravenous Q12H Sherren Mocha, MD   3 mL at 05/22/16 [id number]  . sodium chloride flush (NS) 0.9 % injection 3 mL  3 mL Intravenous PRN Sherren Mocha, MD      .  ticagrelor [facility]) tablet 90 mg  90 mg Oral BID Sherren Mocha, MD   90 mg at 05/22/16 [id number]     Discharge Medications: Please see discharge summary for a list of discharge medications.  Relevant Imaging Results:  Relevant Lab Results:   Additional Information SSN [id number]   Barbette Or, [place]

## 2016-05-22 NOTE — Clinical Social Work Note (Signed)
Clinical Social Work Assessment  Patient Details  Name: Sydney Phillips MRN: [id number] Date of Birth: [birth year]  Date of referral:  05/22/16               Reason for consult:  Facility Placement                Permission sought to share information with:  Family Supports Permission granted to share information::  Yes, Verbal Permission Granted  Name::     Gabriel Carina  Relationship::  Son  Contact Information:  [phone]  Housing/Transportation Living arrangements for the past 2 months:  [place] of Information:  Patient, Adult Children Patient Interpreter Needed:  None Criminal Activity/Legal Involvement Pertinent to Current Situation/Hospitalization:  No - Comment as needed Significant Relationships:  Adult Children Lives with:  Adult Children Do you feel safe going back to the place where you live?  Yes Need for family participation in patient care:  Yes (Comment)  Care giving concerns:  Patient son does not express concern at this time.  Patient son did state that [facility] would likely be the closest facility to patient home.   Social Worker assessment / plan:  Holiday representative met with patient at bedside and spoke with patient son, [name], over the phone to offer support and discuss patient needs at discharge.  Patient and patient son state that patient has been living at home with son, however he works during the day and patient is home alone.  Patient and patient son are agreeable with SNF placement at discharge.  CSW to initiate referral and follow up with patient and son regarding available bed offers.  CSW remains available for support and to facilitate patient discharge needs once medically stable.  Employment status:  Retired Nurse, adult PT Recommendations:  [place] / Referral to community resources:  [place]  Patient/Family's Response to care:  Patient and patient son  verbalized understanding of CSW role and appreciation for support and concern.  Patient and son agreeable with SNF placement.  Patient/Family's Understanding of and Emotional Response to Diagnosis, Current Treatment, and Prognosis:  Patient with limited emotional engagement, however patient son was able to provide follow up information.  Emotional Assessment Appearance:  Appears stated age Attitude/Demeanor/Rapport:  Inconsistent (Engaged) Affect (typically observed):  Accepting, Appropriate, Calm Orientation:  Oriented to Self, Oriented to Place, Oriented to  Time Alcohol / Substance use:  Not Applicable Psych involvement (Current and /or in the community):  No (Comment)  Discharge Needs  Concerns to be addressed:  Discharge Planning Concerns Readmission within the last 30 days:  No Current discharge risk:  Physical Impairment Barriers to Discharge:  Continued Medical Work up  [employer], [place]

## 2016-05-23 DIAGNOSIS — I130 Hypertensive heart and chronic kidney disease with heart failure and stage 1 through stage 4 chronic kidney disease, or unspecified chronic kidney disease: Secondary | ICD-10-CM | POA: Diagnosis not present

## 2016-05-23 DIAGNOSIS — I5082 Biventricular heart failure: Secondary | ICD-10-CM | POA: Diagnosis not present

## 2016-05-23 DIAGNOSIS — E876 Hypokalemia: Secondary | ICD-10-CM | POA: Diagnosis not present

## 2016-05-23 DIAGNOSIS — R4182 Altered mental status, unspecified: Secondary | ICD-10-CM | POA: Diagnosis present

## 2016-05-23 DIAGNOSIS — I348 Other nonrheumatic mitral valve disorders: Secondary | ICD-10-CM | POA: Diagnosis not present

## 2016-05-23 DIAGNOSIS — R531 Weakness: Secondary | ICD-10-CM | POA: Diagnosis not present

## 2016-05-23 DIAGNOSIS — I252 Old myocardial infarction: Secondary | ICD-10-CM | POA: Diagnosis not present

## 2016-05-23 DIAGNOSIS — I2510 Atherosclerotic heart disease of native coronary artery without angina pectoris: Secondary | ICD-10-CM | POA: Diagnosis not present

## 2016-05-23 DIAGNOSIS — E1151 Type 2 diabetes mellitus with diabetic peripheral angiopathy without gangrene: Secondary | ICD-10-CM | POA: Diagnosis not present

## 2016-05-23 DIAGNOSIS — F17210 Nicotine dependence, cigarettes, uncomplicated: Secondary | ICD-10-CM | POA: Diagnosis not present

## 2016-05-23 DIAGNOSIS — I272 Pulmonary hypertension, unspecified: Secondary | ICD-10-CM | POA: Diagnosis not present

## 2016-05-23 DIAGNOSIS — G9340 Encephalopathy, unspecified: Secondary | ICD-10-CM | POA: Diagnosis not present

## 2016-05-23 DIAGNOSIS — I10 Essential (primary) hypertension: Secondary | ICD-10-CM | POA: Diagnosis not present

## 2016-05-23 DIAGNOSIS — Z9181 History of falling: Secondary | ICD-10-CM | POA: Diagnosis not present

## 2016-05-23 DIAGNOSIS — W1839XA Other fall on same level, initial encounter: Secondary | ICD-10-CM | POA: Diagnosis not present

## 2016-05-23 DIAGNOSIS — F19951 Other psychoactive substance use, unspecified with psychoactive substance-induced psychotic disorder with hallucinations: Secondary | ICD-10-CM | POA: Diagnosis not present

## 2016-05-23 DIAGNOSIS — J9611 Chronic respiratory failure with hypoxia: Secondary | ICD-10-CM | POA: Diagnosis not present

## 2016-05-23 DIAGNOSIS — R51 Headache: Secondary | ICD-10-CM | POA: Diagnosis not present

## 2016-05-23 DIAGNOSIS — R400 Somnolence: Secondary | ICD-10-CM | POA: Diagnosis not present

## 2016-05-23 DIAGNOSIS — M542 Cervicalgia: Secondary | ICD-10-CM | POA: Diagnosis not present

## 2016-05-23 DIAGNOSIS — J441 Chronic obstructive pulmonary disease with (acute) exacerbation: Secondary | ICD-10-CM | POA: Diagnosis not present

## 2016-05-23 DIAGNOSIS — Z95810 Presence of automatic (implantable) cardiac defibrillator: Secondary | ICD-10-CM | POA: Diagnosis not present

## 2016-05-23 DIAGNOSIS — F419 Anxiety disorder, unspecified: Secondary | ICD-10-CM | POA: Diagnosis not present

## 2016-05-23 DIAGNOSIS — D72829 Elevated white blood cell count, unspecified: Secondary | ICD-10-CM | POA: Diagnosis not present

## 2016-05-23 DIAGNOSIS — R410 Disorientation, unspecified: Secondary | ICD-10-CM | POA: Diagnosis not present

## 2016-05-23 DIAGNOSIS — Z79899 Other long term (current) drug therapy: Secondary | ICD-10-CM | POA: Diagnosis not present

## 2016-05-23 DIAGNOSIS — I255 Ischemic cardiomyopathy: Secondary | ICD-10-CM | POA: Diagnosis not present

## 2016-05-23 DIAGNOSIS — J9621 Acute and chronic respiratory failure with hypoxia: Secondary | ICD-10-CM | POA: Diagnosis not present

## 2016-05-23 DIAGNOSIS — R740 Nonspecific elevation of levels of transaminase and lactic acid dehydrogenase [LDH]: Secondary | ICD-10-CM | POA: Diagnosis not present

## 2016-05-23 DIAGNOSIS — J438 Other emphysema: Secondary | ICD-10-CM | POA: Diagnosis not present

## 2016-05-23 DIAGNOSIS — I5041 Acute combined systolic (congestive) and diastolic (congestive) heart failure: Secondary | ICD-10-CM | POA: Diagnosis not present

## 2016-05-23 DIAGNOSIS — E1169 Type 2 diabetes mellitus with other specified complication: Secondary | ICD-10-CM | POA: Diagnosis not present

## 2016-05-23 DIAGNOSIS — S0990XA Unspecified injury of head, initial encounter: Secondary | ICD-10-CM | POA: Diagnosis not present

## 2016-05-23 DIAGNOSIS — S25102S Unspecified injury of left innominate or subclavian artery, sequela: Secondary | ICD-10-CM | POA: Diagnosis not present

## 2016-05-23 DIAGNOSIS — J449 Chronic obstructive pulmonary disease, unspecified: Secondary | ICD-10-CM | POA: Diagnosis not present

## 2016-05-23 DIAGNOSIS — Y9389 Activity, other specified: Secondary | ICD-10-CM | POA: Diagnosis not present

## 2016-05-23 DIAGNOSIS — I214 Non-ST elevation (NSTEMI) myocardial infarction: Secondary | ICD-10-CM | POA: Diagnosis not present

## 2016-05-23 DIAGNOSIS — Z950 Presence of cardiac pacemaker: Secondary | ICD-10-CM | POA: Diagnosis not present

## 2016-05-23 DIAGNOSIS — Z794 Long term (current) use of insulin: Secondary | ICD-10-CM | POA: Diagnosis not present

## 2016-05-23 DIAGNOSIS — E872 Acidosis: Secondary | ICD-10-CM | POA: Diagnosis not present

## 2016-05-23 DIAGNOSIS — E1122 Type 2 diabetes mellitus with diabetic chronic kidney disease: Secondary | ICD-10-CM | POA: Diagnosis not present

## 2016-05-23 DIAGNOSIS — N170 Acute kidney failure with tubular necrosis: Secondary | ICD-10-CM | POA: Diagnosis not present

## 2016-05-23 DIAGNOSIS — I5043 Acute on chronic combined systolic (congestive) and diastolic (congestive) heart failure: Secondary | ICD-10-CM | POA: Diagnosis not present

## 2016-05-23 DIAGNOSIS — S098XXA Other specified injuries of head, initial encounter: Secondary | ICD-10-CM | POA: Diagnosis not present

## 2016-05-23 DIAGNOSIS — F329 Major depressive disorder, single episode, unspecified: Secondary | ICD-10-CM | POA: Diagnosis not present

## 2016-05-23 DIAGNOSIS — Z955 Presence of coronary angioplasty implant and graft: Secondary | ICD-10-CM | POA: Diagnosis not present

## 2016-05-23 DIAGNOSIS — Z7951 Long term (current) use of inhaled steroids: Secondary | ICD-10-CM | POA: Diagnosis not present

## 2016-05-23 DIAGNOSIS — E118 Type 2 diabetes mellitus with unspecified complications: Secondary | ICD-10-CM | POA: Diagnosis not present

## 2016-05-23 DIAGNOSIS — Y92129 Unspecified place in nursing home as the place of occurrence of the external cause: Secondary | ICD-10-CM | POA: Diagnosis not present

## 2016-05-23 DIAGNOSIS — E785 Hyperlipidemia, unspecified: Secondary | ICD-10-CM | POA: Diagnosis not present

## 2016-05-23 DIAGNOSIS — I5042 Chronic combined systolic (congestive) and diastolic (congestive) heart failure: Secondary | ICD-10-CM | POA: Diagnosis not present

## 2016-05-23 DIAGNOSIS — R42 Dizziness and giddiness: Secondary | ICD-10-CM | POA: Diagnosis not present

## 2016-05-23 DIAGNOSIS — I2583 Coronary atherosclerosis due to lipid rich plaque: Secondary | ICD-10-CM | POA: Diagnosis not present

## 2016-05-23 DIAGNOSIS — Y999 Unspecified external cause status: Secondary | ICD-10-CM | POA: Diagnosis not present

## 2016-05-23 DIAGNOSIS — S199XXA Unspecified injury of neck, initial encounter: Secondary | ICD-10-CM | POA: Diagnosis not present

## 2016-05-23 DIAGNOSIS — G4489 Other headache syndrome: Secondary | ICD-10-CM | POA: Diagnosis not present

## 2016-05-23 DIAGNOSIS — S0093XA Contusion of unspecified part of head, initial encounter: Secondary | ICD-10-CM | POA: Diagnosis not present

## 2016-05-23 DIAGNOSIS — Z7982 Long term (current) use of aspirin: Secondary | ICD-10-CM | POA: Diagnosis not present

## 2016-05-23 DIAGNOSIS — N183 Chronic kidney disease, stage 3 (moderate): Secondary | ICD-10-CM | POA: Diagnosis not present

## 2016-05-23 DIAGNOSIS — G4733 Obstructive sleep apnea (adult) (pediatric): Secondary | ICD-10-CM | POA: Diagnosis not present

## 2016-05-23 DIAGNOSIS — S0003XA Contusion of scalp, initial encounter: Secondary | ICD-10-CM | POA: Diagnosis not present

## 2016-05-23 LAB — COMPREHENSIVE METABOLIC PANEL
ALBUMIN: 3.4 g/dL — AB (ref 3.5–5.0)
ALK PHOS: 64 U/L (ref 38–126)
ALT: 30 U/L (ref 14–54)
AST: 39 U/L (ref 15–41)
Anion gap: 13 (ref 5–15)
BILIRUBIN TOTAL: 0.8 mg/dL (ref 0.3–1.2)
BUN: 27 mg/dL — AB (ref 6–20)
CALCIUM: 9.6 mg/dL (ref 8.9–10.3)
CO2: 31 mmol/L (ref 22–32)
Chloride: 96 mmol/L — ABNORMAL LOW (ref 101–111)
Creatinine, Ser: 1.21 mg/dL — ABNORMAL HIGH (ref 0.44–1.00)
GFR calc Af Amer: 48 mL/min — ABNORMAL LOW (ref >60)
GFR, EST NON AFRICAN AMERICAN: 42 mL/min — AB (ref >60)
GLUCOSE: 90 mg/dL (ref 65–99)
Potassium: 3.4 mmol/L — ABNORMAL LOW (ref 3.5–5.1)
Sodium: 140 mmol/L (ref 135–145)
TOTAL PROTEIN: 6.3 g/dL — AB (ref 6.5–8.1)

## 2016-05-23 LAB — GLUCOSE, CAPILLARY
GLUCOSE-CAPILLARY: 118 mg/dL — AB (ref 65–99)
Glucose-Capillary: 91 mg/dL (ref 65–99)
Glucose-Capillary: 130 mg/dL — ABNORMAL HIGH (ref 65–99)
Glucose-Capillary: 181 mg/dL — ABNORMAL HIGH (ref 65–99)
Glucose-Capillary: 108 mg/dL — ABNORMAL HIGH (ref 65–99)
Glucose-Capillary: 186 mg/dL — ABNORMAL HIGH (ref 65–99)

## 2016-05-23 LAB — CBC
HEMATOCRIT: 45.2 % (ref 36.0–46.0)
HEMOGLOBIN: 14.5 g/dL (ref 12.0–15.0)
MCH: 30.5 pg (ref 26.0–34.0)
MCHC: 32.1 g/dL (ref 30.0–36.0)
MCV: 95.2 fL (ref 78.0–100.0)
Platelets: 326 10*3/uL (ref 150–400)
RBC: 4.75 MIL/uL (ref 3.87–5.11)
RDW: 15.7 % — ABNORMAL HIGH (ref 11.5–15.5)
WBC: 20.5 10*3/uL — AB (ref 4.0–10.5)

## 2016-05-23 MED ORDER — TICAGRELOR 90 MG PO TABS: 90.0000 mg | ORAL_TABLET | Freq: Two times a day (BID) | ORAL | 2 refills | Status: DC

## 2016-05-23 MED ORDER — NICOTINE 14 MG/24HR TD PT24: 14.0000 mg | MEDICATED_PATCH | Freq: Every day | TRANSDERMAL | 0 refills | Status: DC

## 2016-05-23 MED ORDER — LEVOFLOXACIN 750 MG PO TABS: 750.0000 mg | ORAL_TABLET | ORAL | 0 refills | Status: AC

## 2016-05-23 MED ORDER — FUROSEMIDE 40 MG PO TABS: 40.0000 mg | ORAL_TABLET | Freq: Two times a day (BID) | ORAL | 1 refills | Status: DC

## 2016-05-23 MED ORDER — POTASSIUM CHLORIDE CRYS ER 20 MEQ PO TBCR
20.0000 meq | EXTENDED_RELEASE_TABLET | Freq: Once | ORAL | Status: AC
  Administered 2016-05-23: 20 meq via ORAL
  Filled 2016-05-23: qty 1

## 2016-05-23 MED ORDER — ASPIRIN 81 MG PO CHEW: 81.0000 mg | CHEWABLE_TABLET | Freq: Every day | ORAL | 1 refills | Status: AC

## 2016-05-23 MED ORDER — PREDNISONE 5 MG PO TABS: ORAL_TABLET | ORAL | 0 refills | Status: DC

## 2016-05-23 MED ORDER — ATORVASTATIN CALCIUM 40 MG PO TABS: 40.0000 mg | ORAL_TABLET | Freq: Every day | ORAL | 1 refills | Status: DC

## 2016-05-23 MED ORDER — POTASSIUM CHLORIDE CRYS ER 20 MEQ PO TBCR: 20.0000 meq | EXTENDED_RELEASE_TABLET | Freq: Every day | ORAL | 0 refills | Status: DC

## 2016-05-23 MED ORDER — IPRATROPIUM-ALBUTEROL 0.5-2.5 (3) MG/3ML IN SOLN: 3.0000 mL | Freq: Three times a day (TID) | RESPIRATORY_TRACT | 1 refills | Status: DC

## 2016-05-23 MED ORDER — BISOPROLOL FUMARATE 5 MG PO TABS: 5.0000 mg | ORAL_TABLET | Freq: Every day | ORAL | 1 refills | Status: DC

## 2016-05-23 MED ORDER — ALPRAZOLAM 0.5 MG PO TABS: 0.5000 mg | ORAL_TABLET | Freq: Three times a day (TID) | ORAL | 0 refills | Status: DC | PRN

## 2016-05-23 MED ORDER — NICOTINE 14 MG/24HR TD PT24: 14.0000 mg | MEDICATED_PATCH | Freq: Every day | TRANSDERMAL | Status: DC

## 2016-05-23 MED ORDER — HYDROCODONE-ACETAMINOPHEN 10-325 MG PO TABS: 1.0000 | ORAL_TABLET | Freq: Four times a day (QID) | ORAL | 0 refills | Status: DC | PRN

## 2016-05-23 MED ORDER — INSULIN GLARGINE 100 UNIT/ML ~~LOC~~ SOLN: 12.0000 [IU] | Freq: Every day | SUBCUTANEOUS | 11 refills | Status: DC

## 2016-05-23 MED ORDER — DM-GUAIFENESIN ER 30-600 MG PO TB12: 1.0000 | ORAL_TABLET | Freq: Two times a day (BID) | ORAL | 1 refills | Status: DC

## 2016-05-23 NOTE — Progress Notes (Signed)
CARDIAC REHAB PHASE I  Spoke with pt's son, [name] outside of pt's room who states pt is not receptive to visitors or education at this time. Briefly discussed importance of brilinta, phase 2 referral has been sent to [facility], pt will unlikely be able to participate. Per nursing staff, pt is for d/c to SNF today, will sign off.   Lenna Sciara, RN, BSN 05/23/2016 1:43 PM

## 2016-05-23 NOTE — Progress Notes (Signed)
Attempted report to SNF [facility] at [phone] x 2. Put on hold for 15 minutes; UNABLE TO GIVE REPORT to receiving facility staff. Awaiting [facility] to transfer out.

## 2016-05-23 NOTE — Clinical Social Work Note (Signed)
CSW has confirmed with patient's son that the plan is now for the patient to return home with [facility] services. The son plans to transport. Agricultural consultant notified.    Liz Beach MSW, [place], [place], [id number]

## 2016-05-23 NOTE — Discharge Summary (Addendum)
Physician Discharge Summary  SADHANA FRATER MRN: [id number] DOB/AGE: 1936/03/27 80 y.o.  PCP: Laurey Morale, MD   Admit date: 05/14/2016 Discharge date: 05/23/2016  Discharge Diagnoses:    Active Problems:   Essential hypertension   Coronary atherosclerosis   Hypokalemia   Biventricular implantable cardioverter-defibrillator in situ   NSTEMI (non-ST elevated myocardial infarction) (HCC)   COPD exacerbation (HCC)   Acute on chronic combined systolic and diastolic ACC/AHA stage C congestive heart failure (HCC)   Encephalopathy   CAD in native artery   Acute renal failure with acute tubular necrosis superimposed on stage 3 chronic kidney disease (HCC)   Somnolence   OSA and COPD overlap syndrome (HCC)   Cardiomyopathy, ischemic   Ischemic mitral valve regurgitation   Pulmonary hypertension   Controlled diabetes mellitus type 2 with complications ([place])   Fall at home   Stenosis of left subclavian artery ([place])    Follow-up recommendations Follow-up with PCP in 3-5 days , including all  additional recommended appointments as below Follow-up CBC, CMP in 3-5 days DAPT with ASA and Brilinta 12 months Will need early f/u (TCM 1 week preferably),    Current Discharge Medication List    START taking these medications   Details  aspirin 81 MG chewable tablet Chew 1 tablet (81 mg total) by mouth daily. Qty: 30 tablet, Refills: 1    atorvastatin (LIPITOR) 40 MG tablet Take 1 tablet (40 mg total) by mouth daily at 6 PM. Qty: 30 tablet, Refills: 1    bisoprolol (ZEBETA) 5 MG tablet Take 1 tablet (5 mg total) by mouth daily. Qty: 30 tablet, Refills: 1    dextromethorphan-guaiFENesin (MUCINEX DM) 30-600 MG 12hr tablet Take 1 tablet by mouth 2 (two) times daily. Qty: 60 tablet, Refills: 1    insulin glargine (LANTUS) 100 UNIT/ML injection Inject 0.12 mLs (12 Units total) into the skin daily. Qty: 10 mL, Refills: 11    ipratropium-albuterol (DUONEB) 0.5-2.5 (3) MG/3ML SOLN  Take 3 mLs by nebulization 3 (three) times daily. Qty: 360 mL, Refills: 1    levofloxacin (LEVAQUIN) 750 MG tablet Take 1 tablet (750 mg total) by mouth every other day. Qty: 2 tablet, Refills: 0    nicotine (NICODERM CQ - DOSED IN MG/24 HOURS) 14 mg/24hr patch Place 1 patch (14 mg total) onto the skin daily. Qty: 28 patch, Refills: 0    potassium chloride SA (K-DUR,KLOR-CON) 20 MEQ tablet Take 1 tablet (20 mEq total) by mouth daily. Qty: 30 tablet, Refills: 0    predniSONE (DELTASONE) 5 MG tablet 4 tablets 3 days, 3 tablets 3 days, 2 tablets 3 days, 1 tablet 3 days, then DC Qty: 120 tablet, Refills: 0    ticagrelor (BRILINTA) 90 MG TABS tablet Take 1 tablet (90 mg total) by mouth 2 (two) times daily. Qty: 60 tablet, Refills: 2      CONTINUE these medications which have CHANGED   Details  ALPRAZolam (XANAX) 0.5 MG tablet Take 1 tablet (0.5 mg total) by mouth 3 (three) times daily as needed for anxiety. Qty: 30 tablet, Refills: 0    furosemide (LASIX) 40 MG tablet Take 1 tablet (40 mg total) by mouth 2 (two) times daily. Qty: 60 tablet, Refills: 1    HYDROcodone-acetaminophen (NORCO) 10-325 MG tablet Take 1 tablet by mouth every 6 (six) hours as needed for moderate pain. Qty: 10 tablet, Refills: 0      CONTINUE these medications which have NOT CHANGED   Details  ACCU-CHEK FASTCLIX LANCETS MISC USE  TO CHECK GLUCOSE ONCE DAILY Qty: 102 each, Refills: 1    ACCU-CHEK SMARTVIEW test strip CHECK BLOOD SUGAR ONCE DAILY Qty: 100 each, Refills: 2    albuterol (PROVENTIL HFA;VENTOLIN HFA) 108 (90 Base) MCG/ACT inhaler Inhale 2 puffs into the lungs every 4 (four) hours as needed for wheezing or shortness of breath. Qty: 1 Inhaler, Refills: 5    allopurinol (ZYLOPRIM) 300 MG tablet Take 1 tablet (300 mg total) by mouth daily. Qty: 30 tablet, Refills: 11    DULoxetine (CYMBALTA) 60 MG capsule TAKE ONE CAPSULE BY MOUTH TWICE DAILY Qty: 60 capsule, Refills: 11    !! fluticasone  (CLARISPRAY) 50 MCG/ACT nasal spray Place 1 spray into both nostrils daily.    !! fluticasone (FLONASE) 50 MCG/ACT nasal spray Place 1 spray into both nostrils daily as needed for allergies or rhinitis.    nitroGLYCERIN (NITROSTAT) 0.4 MG SL tablet Place 1 tablet (0.4 mg total) under the tongue every 5 (five) minutes x 3 doses as needed for chest pain. Qty: 30 tablet, Refills: 11     !! - Potential duplicate medications found. Please discuss with provider.    STOP taking these medications     clopidogrel (PLAVIX) 75 MG tablet      metoprolol succinate (TOPROL-XL) 25 MG 24 hr tablet      metoprolol tartrate (LOPRESSOR) 25 MG tablet           Discharge Condition: *Stable    Discharge Instructions Get Medicines reviewed and adjusted: Please take all your medications with you for your next visit with your Primary MD  Please request your Primary MD to go over all hospital tests and procedure/radiological results at the follow up, please ask your Primary MD to get all Hospital records sent to his/her office.  If you experience worsening of your admission symptoms, develop shortness of breath, life threatening emergency, suicidal or homicidal thoughts you must seek medical attention immediately by calling 911 or calling your MD immediately if symptoms less severe.  You must read complete instructions/literature along with all the possible adverse reactions/side effects for all the Medicines you take and that have been prescribed to you. Take any new Medicines after you have completely understood and accpet all the possible adverse reactions/side effects.   Do not drive when taking Pain medications.   Do not take more than prescribed Pain, Sleep and Anxiety Medications  Special Instructions: If you have smoked or chewed Tobacco in the last 2 yrs please stop smoking, stop any regular Alcohol and or any Recreational drug use.  Wear Seat belts while driving.  Please note  You  were cared for by a hospitalist during your hospital stay. Once you are discharged, your primary care physician will handle any further medical issues. Please note that NO REFILLS for any discharge medications will be authorized once you are discharged, as it is imperative that you return to your primary care physician (or establish a relationship with a primary care physician if you do not have one) for your aftercare needs so that they can reassess your need for medications and monitor your lab values.  Discharge Instructions    AMB Referral to Cardiac Rehabilitation - Phase II    Complete by:  As directed    Diagnosis:  NSTEMI       Allergies  Allergen Reactions  . Potassium-Containing Compounds Other (See Comments)    Causes severe constipation  . Azithromycin Rash  . Codeine Itching  . Darvon Itching  . Erythromycin  Rash  . Meloxicam Other (See Comments)    Unknown    . Norco [Hydrocodone-Acetaminophen] Itching  . Penicillins Rash    Has patient had a PCN reaction causing immediate rash, facial/tongue/throat swelling, SOB or lightheadedness with hypotension: unknown Has patient had a PCN reaction causing severe rash involving mucus membranes or skin necrosis: no Has patient had a PCN reaction that required hospitalization: unknown Has patient had a PCN reaction occurring within the last 10 years: no If all of the above answers are "NO", then may proceed with Cephalosporin use.   Marland Kitchen Propoxyphene N-Acetaminophen Itching  . Rofecoxib Other (See Comments)    Unknown    . Rosuvastatin Other (See Comments)    cramps  . Statins Itching and Other (See Comments)    Sleeplessness  . Sulfa Antibiotics Rash      Disposition: SNF   Consults:  Critical care Cardiology     Significant Diagnostic Studies:  Dg Chest 2 View  Result Date: 05/14/2016 CLINICAL DATA:  Recent falls, found on floor today, possibly hitting head, recent cough EXAM: CHEST  2 VIEW COMPARISON:  Chest  x-ray of 04/09/2016 FINDINGS: There is little change in cardiomegaly, and there does appear to be mild pulmonary vascular congestion present. No focal infiltrate or effusion is seen. Pacer with AICD lead remains. The bones are osteopenic. IMPRESSION: Stable cardiomegaly.  Question mild pulmonary vascular congestion Electronically Signed   By: Ivar Drape M.D.   On: 05/14/2016 08:55   Dg Ribs Unilateral W/chest Left  Result Date: 05/14/2016 CLINICAL DATA:  Left-sided chest pain following recent fall, initial encounter EXAM: LEFT RIBS AND CHEST - 3+ VIEW COMPARISON:  05/14/2016 FINDINGS: Cardiac shadow is again enlarged. A defibrillator is again seen. Poor inspiratory effort is noted with crowding of the vascular markings. Calcified granuloma is noted in the left lung. No acute rib fracture or pneumothorax is noted. IMPRESSION: No evidence of acute rib fracture. Electronically Signed   By: Inez Catalina M.D.   On: 05/14/2016 14:06   Ct Head Wo Contrast  Result Date: 05/14/2016 CLINICAL DATA:  Per EMS pt is from home. Son found her sitting on the floor next to her bed. Down time unknown. Pt has Hx of dementia and COPD EXAM: CT HEAD WITHOUT CONTRAST CT CERVICAL SPINE WITHOUT CONTRAST TECHNIQUE: Multidetector CT imaging of the head and cervical spine was performed following the standard protocol without intravenous contrast. Multiplanar CT image reconstructions of the cervical spine were also generated. COMPARISON:  02/26/2016 and previous FINDINGS: CT HEAD FINDINGS Brain: Diffuse parenchymal atrophy. Patchy areas of hypoattenuation in deep and periventricular white matter bilaterally. Negative for acute intracranial hemorrhage, mass lesion, acute infarction, midline shift, or mass-effect. Acute infarct may be inapparent on noncontrast CT. Ventricles and sulci symmetric. Vascular: Atherosclerotic and physiologic intracranial calcifications. Skull:  Bone windows demonstrate no focal lesion. Sinuses/Orbits: No  acute finding. Other: None. CT CERVICAL SPINE FINDINGS Alignment: 2-3 mm anterolisthesis C4-5 and C5-6 probably secondary to asymmetric facet DJD. Skull base and vertebrae: No acute fracture. No primary bone lesion or focal pathologic process. Soft tissues and spinal canal: No prevertebral fluid or swelling. No visible canal hematoma. Disc levels: Central protrusion C2-3. Mild narrowing C5-6 with right posterolateral and foraminal partially calcified protrusion resulting in foraminal stenosis. Of uncovertebral facets side DJD results in foraminal encroachment bilaterally C3-4, left C4-5, right C5-6, C6-7, and C7-T1. Upper chest: Transvenous pacing leads are partially visualized. Visualized lung apices unremarkable. Bilateral carotid calcified plaque. Other: None IMPRESSION: 1. Negative  for bleed or other acute intracranial process. 2. Atrophy and nonspecific white matter changes. 3. Negative for cervical fracture or other acute bone abnormality. 4. Multilevel degenerative changes as enumerated above. Electronically Signed   By: Lucrezia Europe M.D.   On: 05/14/2016 08:46   Ct Cervical Spine Wo Contrast  Result Date: 05/14/2016 CLINICAL DATA:  Per EMS pt is from home. Son found her sitting on the floor next to her bed. Down time unknown. Pt has Hx of dementia and COPD EXAM: CT HEAD WITHOUT CONTRAST CT CERVICAL SPINE WITHOUT CONTRAST TECHNIQUE: Multidetector CT imaging of the head and cervical spine was performed following the standard protocol without intravenous contrast. Multiplanar CT image reconstructions of the cervical spine were also generated. COMPARISON:  02/26/2016 and previous FINDINGS: CT HEAD FINDINGS Brain: Diffuse parenchymal atrophy. Patchy areas of hypoattenuation in deep and periventricular white matter bilaterally. Negative for acute intracranial hemorrhage, mass lesion, acute infarction, midline shift, or mass-effect. Acute infarct may be inapparent on noncontrast CT. Ventricles and sulci  symmetric. Vascular: Atherosclerotic and physiologic intracranial calcifications. Skull:  Bone windows demonstrate no focal lesion. Sinuses/Orbits: No acute finding. Other: None. CT CERVICAL SPINE FINDINGS Alignment: 2-3 mm anterolisthesis C4-5 and C5-6 probably secondary to asymmetric facet DJD. Skull base and vertebrae: No acute fracture. No primary bone lesion or focal pathologic process. Soft tissues and spinal canal: No prevertebral fluid or swelling. No visible canal hematoma. Disc levels: Central protrusion C2-3. Mild narrowing C5-6 with right posterolateral and foraminal partially calcified protrusion resulting in foraminal stenosis. Of uncovertebral facets side DJD results in foraminal encroachment bilaterally C3-4, left C4-5, right C5-6, C6-7, and C7-T1. Upper chest: Transvenous pacing leads are partially visualized. Visualized lung apices unremarkable. Bilateral carotid calcified plaque. Other: None IMPRESSION: 1. Negative for bleed or other acute intracranial process. 2. Atrophy and nonspecific white matter changes. 3. Negative for cervical fracture or other acute bone abnormality. 4. Multilevel degenerative changes as enumerated above. Electronically Signed   By: Lucrezia Europe M.D.   On: 05/14/2016 08:46    2-D echo LV EF: 25% -   30%  ------------------------------------------------------------------- Indications:      Acute respiratory distress 518.82.  ------------------------------------------------------------------- History:   PMH:   Coronary artery disease.  Chronic obstructive pulmonary disease.  PMH:   Myocardial infarction.  Risk factors: Current tobacco use. Hypertension.  ------------------------------------------------------------------- Study Conclusions  - Left ventricle: The cavity size was normal. Systolic function was   severely reduced. The estimated ejection fraction was in the   range of 25% to 30%. Diffuse hypokinesis. Features are consistent   with a  pseudonormal left ventricular filling pattern, with   concomitant abnormal relaxation and increased filling pressure   (grade 2 diastolic dysfunction). Doppler parameters are   consistent with elevated ventricular end-diastolic filling   pressure. - Ventricular septum: The contour showed diastolic flattening and   systolic flattening. - Aortic valve: Trileaflet; mildly thickened, mildly calcified   leaflets. There was no regurgitation. - Aortic root: The aortic root was normal in size. - Mitral valve: Structurally normal valve. There was moderate to   severe regurgitation. - Left atrium: The atrium was moderately dilated. - Right ventricle: The cavity size was normal. Wall thickness was   normal. Systolic function was normal. - Right atrium: Pacer wire or catheter noted in right atrium. - Tricuspid valve: There was moderate regurgitation. - Pulmonic valve: Transvalvular velocity was within the normal   range. There was no evidence for stenosis. - Pulmonary arteries: Systolic pressure was moderately to  severely   increased. PA peak pressure: 52 mm Hg (S). - Pericardium, extracardiac: There was no pericardial effusion.  Impressions:  - When compared to the prior study from 02/27/2016 LVEF has further   decreased, now 25-30% with diffuse hypokinesis. Elevated filling   pressures.   Mitral regurgitation is at least moderate to severe.   Moderate to severe pulmonary hypertension, RVSP 52 mmHg.    Filed Weights   05/20/16 2135 05/21/16 0545 05/22/16 0518  Weight: 82.6 kg (182 lb 1.6 oz) 82.6 kg (182 lb) 80.5 kg (177 lb 6.4 oz)     Microbiology: Recent Results (from the past 240 hour(s))  MRSA PCR Screening     Status: None   Collection Time: 05/15/16 12:34 AM  Result Value Ref Range Status   MRSA by PCR NEGATIVE NEGATIVE Final    Comment:        The GeneXpert MRSA Assay (FDA approved for NASAL specimens only), is one component of a comprehensive MRSA  colonization surveillance program. It is not intended to diagnose MRSA infection nor to guide or monitor treatment for MRSA infections.   Respiratory Panel by PCR     Status: None   Collection Time: 05/15/16  6:03 AM  Result Value Ref Range Status   Adenovirus NOT DETECTED NOT DETECTED Final   Coronavirus 229E NOT DETECTED NOT DETECTED Final   Coronavirus HKU1 NOT DETECTED NOT DETECTED Final   Coronavirus NL63 NOT DETECTED NOT DETECTED Final   Coronavirus OC43 NOT DETECTED NOT DETECTED Final   Metapneumovirus NOT DETECTED NOT DETECTED Final   Rhinovirus / Enterovirus NOT DETECTED NOT DETECTED Final   Influenza A NOT DETECTED NOT DETECTED Final   Influenza B NOT DETECTED NOT DETECTED Final   Parainfluenza Virus 1 NOT DETECTED NOT DETECTED Final   Parainfluenza Virus 2 NOT DETECTED NOT DETECTED Final   Parainfluenza Virus 3 NOT DETECTED NOT DETECTED Final   Parainfluenza Virus 4 NOT DETECTED NOT DETECTED Final   Respiratory Syncytial Virus NOT DETECTED NOT DETECTED Final   Bordetella pertussis NOT DETECTED NOT DETECTED Final   Chlamydophila pneumoniae NOT DETECTED NOT DETECTED Final   Mycoplasma pneumoniae NOT DETECTED NOT DETECTED Final  Culture, blood (routine x 2)     Status: None   Collection Time: 05/15/16 11:20 AM  Result Value Ref Range Status   Specimen Description BLOOD RIGHT ANTECUBITAL  Final   Special Requests BOTTLES DRAWN AEROBIC AND ANAEROBIC  2CC  Final   Culture NO GROWTH 5 DAYS  Final   Report Status 05/20/2016 FINAL  Final  Culture, blood (routine x 2)     Status: None   Collection Time: 05/15/16 11:31 AM  Result Value Ref Range Status   Specimen Description BLOOD RIGHT HAND  Final   Special Requests BOTTLES DRAWN AEROBIC ONLY  5CC  Final   Culture NO GROWTH 5 DAYS  Final   Report Status 05/20/2016 FINAL  Final  Urine culture     Status: None   Collection Time: 05/15/16 12:30 PM  Result Value Ref Range Status   Specimen Description URINE, CATHETERIZED   Final   Special Requests NONE  Final   Culture NO GROWTH  Final   Report Status 05/16/2016 FINAL  Final  Culture, respiratory (NON-Expectorated)     Status: None   Collection Time: 05/16/16  6:03 AM  Result Value Ref Range Status   Specimen Description TRACHEAL ASPIRATE  Final   Special Requests NONE  Final   Gram Stain   Final  RARE WBC PRESENT,BOTH PMN AND MONONUCLEAR FEW SQUAMOUS EPITHELIAL CELLS PRESENT RARE GRAM POSITIVE COCCI IN PAIRS RARE GRAM NEGATIVE COCCOBACILLI RARE GRAM POSITIVE RODS    Culture Consistent with normal respiratory flora.  Final   Report Status 05/18/2016 FINAL  Final       Blood Culture    Component Value Date/Time   SDES TRACHEAL ASPIRATE 05/16/2016 0603   SPECREQUEST NONE 05/16/2016 0603   CULT Consistent with normal respiratory flora. 05/16/2016 0603   REPTSTATUS 05/18/2016 FINAL 05/16/2016 0603      Labs: Results for orders placed or performed during the hospital encounter of 05/14/16 (from the past 48 hour(s))  Glucose, capillary     Status: None   Collection Time: 05/21/16 11:07 AM  Result Value Ref Range   Glucose-Capillary 91 65 - 99 mg/dL  Glucose, capillary     Status: Abnormal   Collection Time: 05/21/16  4:08 PM  Result Value Ref Range   Glucose-Capillary 130 (H) 65 - 99 mg/dL  Glucose, capillary     Status: Abnormal   Collection Time: 05/21/16  8:30 PM  Result Value Ref Range   Glucose-Capillary 152 (H) 65 - 99 mg/dL  CBC     Status: Abnormal   Collection Time: 05/22/16  6:27 AM  Result Value Ref Range   WBC 20.9 (H) 4.0 - 10.5 K/uL   RBC 4.53 3.87 - 5.11 MIL/uL   Hemoglobin 13.8 12.0 - 15.0 g/dL   HCT 43.2 36.0 - 46.0 %   MCV 95.4 78.0 - 100.0 fL   MCH 30.5 26.0 - 34.0 pg   MCHC 31.9 30.0 - 36.0 g/dL   RDW 15.5 11.5 - 15.5 %   Platelets 343 150 - 400 K/uL  Comprehensive metabolic panel     Status: Abnormal   Collection Time: 05/22/16  6:27 AM  Result Value Ref Range   Sodium 141 135 - 145 mmol/L   Potassium 3.6  3.5 - 5.1 mmol/L   Chloride 99 (L) 101 - 111 mmol/L   CO2 30 22 - 32 mmol/L   Glucose, Bld 80 65 - 99 mg/dL   BUN 24 (H) 6 - 20 mg/dL   Creatinine, Ser 1.27 (H) 0.44 - 1.00 mg/dL   Calcium 9.0 8.9 - 10.3 mg/dL   Total Protein 5.8 (L) 6.5 - 8.1 g/dL   Albumin 3.2 (L) 3.5 - 5.0 g/dL   AST 32 15 - 41 U/L   ALT 24 14 - 54 U/L   Alkaline Phosphatase 57 38 - 126 U/L   Total Bilirubin 0.9 0.3 - 1.2 mg/dL   GFR calc non Af Amer 39 (L) >60 mL/min   GFR calc Af Amer 46 (L) >60 mL/min    Comment: (NOTE) The eGFR has been calculated using the CKD EPI equation. This calculation has not been validated in all clinical situations. eGFR's persistently <60 mL/min signify possible Chronic Kidney Disease.    Anion gap 12 5 - 15  Glucose, capillary     Status: None   Collection Time: 05/22/16  7:32 AM  Result Value Ref Range   Glucose-Capillary 91 65 - 99 mg/dL  Glucose, capillary     Status: Abnormal   Collection Time: 05/22/16 11:26 AM  Result Value Ref Range   Glucose-Capillary 181 (H) 65 - 99 mg/dL  Glucose, capillary     Status: Abnormal   Collection Time: 05/22/16  4:17 PM  Result Value Ref Range   Glucose-Capillary 234 (H) 65 - 99 mg/dL  Glucose, capillary  Status: Abnormal   Collection Time: 05/22/16  9:49 PM  Result Value Ref Range   Glucose-Capillary 158 (H) 65 - 99 mg/dL  CBC     Status: Abnormal   Collection Time: 05/23/16  4:56 AM  Result Value Ref Range   WBC 20.5 (H) 4.0 - 10.5 K/uL   RBC 4.75 3.87 - 5.11 MIL/uL   Hemoglobin 14.5 12.0 - 15.0 g/dL   HCT 45.2 36.0 - 46.0 %   MCV 95.2 78.0 - 100.0 fL   MCH 30.5 26.0 - 34.0 pg   MCHC 32.1 30.0 - 36.0 g/dL   RDW 15.7 (H) 11.5 - 15.5 %   Platelets 326 150 - 400 K/uL  Comprehensive metabolic panel     Status: Abnormal   Collection Time: 05/23/16  4:56 AM  Result Value Ref Range   Sodium 140 135 - 145 mmol/L   Potassium 3.4 (L) 3.5 - 5.1 mmol/L   Chloride 96 (L) 101 - 111 mmol/L   CO2 31 22 - 32 mmol/L   Glucose, Bld 90  65 - 99 mg/dL   BUN 27 (H) 6 - 20 mg/dL   Creatinine, Ser 1.21 (H) 0.44 - 1.00 mg/dL   Calcium 9.6 8.9 - 10.3 mg/dL   Total Protein 6.3 (L) 6.5 - 8.1 g/dL   Albumin 3.4 (L) 3.5 - 5.0 g/dL   AST 39 15 - 41 U/L   ALT 30 14 - 54 U/L   Alkaline Phosphatase 64 38 - 126 U/L   Total Bilirubin 0.8 0.3 - 1.2 mg/dL   GFR calc non Af Amer 42 (L) >60 mL/min   GFR calc Af Amer 48 (L) >60 mL/min    Comment: (NOTE) The eGFR has been calculated using the CKD EPI equation. This calculation has not been validated in all clinical situations. eGFR's persistently <60 mL/min signify possible Chronic Kidney Disease.    Anion gap 13 5 - 15  Glucose, capillary     Status: Abnormal   Collection Time: 05/23/16  7:37 AM  Result Value Ref Range   Glucose-Capillary 108 (H) 65 - 99 mg/dL     Lipid Panel     Component Value Date/Time   CHOL 193 05/15/2016 0248   TRIG 116 05/15/2016 0248   HDL 25 (L) 05/15/2016 0248   CHOLHDL 7.7 05/15/2016 0248   VLDL 23 05/15/2016 0248   LDLCALC 145 (H) 05/15/2016 0248   LDLDIRECT 152.0 05/30/2015 1452     Lab Results  Component Value Date   HGBA1C 6.9 (H) 05/15/2016   HGBA1C 6.9 (H) 05/14/2016   HGBA1C 6.6 (H) 02/26/2016        HPI :  80 y/o F, current smoker, with PMH of arthritis, chronic back pain, depression with anxiety, fibromyalgia, CKD III, CAD s/p angioplasty, PVD s/p fem-pop bypass, carotid artery disease s/p L CEA, L subclavian artery stenosis s/p stent 07/2011, CHF s/p AICD, HTN, HLD, LBBB, GERD and O2 dependent COPD, on 2 L at home, who presented to [facility] on 11/29 accompanied by her son with reports of increased falls.    Family reported on admission that she had 3 falls the week prior to presentation.  She was found down on the floor at home (unknown downtime).  At baseline, family reports she is normally active.  ER examination found her to be somnolent but arousable.  Labs - K 2.6, Cl 95, glucose 174, BUN 24, Sr Cr 1.64, WBC 18.1, troponin 11.34,  CK 1073, UDS positive for opiates + benzos.  She was admitted per [facility] for possible NSTEMI, AMS, chronic hypoxic respiratory failure & hypokalemia.  Cardiology was consulted for evaluation.  The patient was noted to remain lethargic.  Rapid response was called around MN on 11/30 and an ABG was assessed > 7.45 / 40 / 64.1 / 28.1.  Per notes at that time, she had increased wheezing on exam.  Medications were adjusted from metoprolol to bisoprolol.  Am of 11/30 she was noted to have ongoing somnolence.PCCM consulted for evaluation of respiratory failure.   HOSPITAL COURSE:    Altered mental status-resolved -Likely multifactorial to include COPD exacerbation, NSTEMI, overuse of Narcotic/Benzodiazepine, noncompliance. -Resolved  Acute on Chronic hypoxic respiratory failure  Her respiratory failure is felt to be due to a COPD exacerbation. She has had a cough productive of yellow sputum, weakness and worsening dyspnea for several weeks. On arrival she was profoundly hypoxic. She has no signs of volume overload or CHF. Weight is stable at baseline. No LE edema. Her troponin is elevated after a prolonged down time at home (CK also over 1000). The troponin elevation likely represents demand ischemia in a patient with known moderately severe CAD -Continue DuoNeb QID -Mucinex DM BID -Flutter valve -Physiotherapy vest QID (refuses to use) -Continue to taper steroids -Now off BiPAP, Titrate O2 to maintain SPO2 > 88% -Continue levofloxacin for 2 more doses -Respiratory virus panel negative, tracheal aspirate negative,urine negative  OSA and COPD overlap syndrome -CPAP or BiPAP per respiratory: Maintain SPO2> 89% now off BiPAP requiring 2-3 L of oxygen Uses 2 L of oxygen at baseline   CAD s/p NSTEMI s/p DES LCX and RSW:NIOEVOJJ was 11 with total CK 1000 on arrival. She required IV heparin. Beta blocker changed to bisoprolol. Status postcardiac cath 12/5. Showed severe 2 vessel coronary artery disease,  CHF with elevated PCWP/LVEDP.  Status postcardiac cath, now on  DAPT with ASA and Brilinta 12 months -  Has moderate LAD stenosis,    Chronic Systolic and Diastolic CHF/Ischemic Cardiomyopathy h/o mixed cardiomyopathy , s/p Medtronic CRT-D (original ICD 2009, s/p device change out 2013)  History of EF of 20% in 2009. - ejection fraction has improved based on echocardiogram in 2013 2015, however echocardiogram obtained in September shows her ejection fraction went down again to 40-45%.with diffuse hypokinesis and grade one diastolic dysfunction in 0/0938.  Limited echo this admission with LVEF=25-30%  On bisoprolol, no ACE-I or ARB in setting of renal insufficiency. Cardiology managing diuretics    Mitral valve regurgitation -Per cardiology has worsened since September 2017 Moderate to severe. This has changed since her echo in September 2017. ? Ischemic MR  Pulmonary hypertension -See CHF  CAD native artery -see CHF  Acute on CKD stage III(baseline Cr 1.0 to 1.32) Recent Labs       Lab Results  Component Value Date   CREATININE 1.27 (H) 05/22/2016   CREATININE 1.42 (H) 05/21/2016   CREATININE 1.27 (H) 05/20/2016    -Appears patient may be euvolemic  ,Decreased Lasix to 40 mg Twice a day per cardiology    Hypokalemia -Potassium goal> 4  Hypomagnesemia -Magnesium goal> 2  Diabetes type 2 controlled with complication -18/29 Hemoglobin A1c= 6.9 Continue Lantus 12 units daily -Resistant SSI  HLD -Lipid panel: Not within ADA guidelines -Lipitor 40 mg daily:      Discharge Exam:   Blood pressure 109/84, pulse 79, temperature 97.6 F (36.4 C), temperature source Oral, resp. rate (!) 23, height '5\' 5"'  (1.651 m), weight 80.5 kg (177 lb 6.4  oz), SpO2 97 %.  Gen. Elderly, well-nourished, in no resp distress ENT - no pallor,icterus, no post nasal drip Neck: No JVD, no thyromegaly, no carotid bruits Lungs: no use of accessory muscles, no  dullness to percussion, clear BS, no rhonchi Cardiovascular: Rhythm regular, heart sounds  normal, no murmurs, no peripheral edema Abdomen: soft and non-tender, no hepatosplenomegaly, BS normal.    Follow-up Information    FRY,STEPHEN A, MD. Schedule an appointment as soon as possible for a visit.   Specialty:  Family Medicine Why:  Hospital follow-up Contact information: [place]  [phone]           Signed: Reyne Dumas 05/23/2016, 7:52 AM        Time spent >45 mins

## 2016-05-23 NOTE — Care Management (Addendum)
12:15 Discharge has again changed - son now doesn't feel comfortable with taking pt home and prefers her to discharge to SNF. CSW made aware.  HH and DME liason made aware as well.  11:56 CM contacted by CSW- son has been successful in finding around the clock care when he is not home - both pt and son prefer to discharge home.  Pts daughter in law will stay with pt while son [name] as it work during the day.  Attending made aware of change in discharge plan.  CM provided son with choice of [place] agency - son chose [facility] - agency contacted and referral accepted.  Pt has oxygen supplied through [facility] - per son they have oxygen in the home but need portable tank - CM contacted liaison and requested oxygen tank.   Pt given Brilinta free 30 day card and CM informed son to ask pharmacist if prior auth needs to be arranged as pt nor son wanted to wait for benefit check  10:45 CM spoke in depth with pts son [name] - [name] wanted to take pt home because he thought insurance would cover someone being with pt all day while he was at work.  CM informed son that [facility] will not cover all day but CM offered private pay list - pt son declined.  Pt son stated he may be able to get someone during the day but he isn't sure - CM informed pt son that pt had been discharged and we need a final safe plan for discharged today - son stated pt should go to SNF as he is not sure who he can get to be with mom during the day.  CSW consulted

## 2016-05-23 NOTE — Progress Notes (Addendum)
Patient Name: Sydney Phillips Date of Encounter: 05/23/2016  Primary Cardiologist: [facility] Problem List     Active Problems:   Essential hypertension   Coronary atherosclerosis   Hypokalemia   Biventricular implantable cardioverter-defibrillator in situ   NSTEMI (non-ST elevated myocardial infarction) (HCC)   COPD exacerbation (HCC)   Acute on chronic combined systolic and diastolic ACC/AHA stage C congestive heart failure (HCC)   Encephalopathy   CAD in native artery   Acute renal failure with acute tubular necrosis superimposed on stage 3 chronic kidney disease (HCC)   Somnolence   OSA and COPD overlap syndrome (HCC)   Cardiomyopathy, ischemic   Ischemic mitral valve regurgitation   Pulmonary hypertension   Controlled diabetes mellitus type 2 with complications ([place])   Fall at home   Stenosis of left subclavian artery ([place])     Subjective   Feels well, no dyspnea, no angina. In/out not recorded. Weight back to admission weight, 14 lb down from peak weight during this hospitalization.  Inpatient Medications    Scheduled Meds:  allopurinol  300 mg Oral Daily   aspirin  324 mg Oral Once   aspirin  81 mg Oral Daily   atorvastatin  40 mg Oral q1800   bisoprolol  5 mg Oral Daily   budesonide (PULMICORT) nebulizer solution  0.25 mg Nebulization BID   chlorhexidine  15 mL Mouth Rinse BID   dextromethorphan-guaiFENesin  1 tablet Oral BID   DULoxetine  60 mg Oral BID   furosemide  40 mg Oral BID   heparin  5,000 Units Subcutaneous Q8H   insulin aspart  0-20 Units Subcutaneous TID WC   insulin aspart  0-5 Units Subcutaneous QHS   insulin glargine  12 Units Subcutaneous Daily   ipratropium-albuterol  3 mL Nebulization TID   levofloxacin  750 mg Oral Q48H   mouth rinse  15 mL Mouth Rinse q12n4p   predniSONE  20 mg Oral Q breakfast   sodium chloride flush  3 mL Intravenous Q12H   ticagrelor  90 mg Oral BID   Continuous Infusions:   sodium chloride Stopped (05/20/16 1345)   PRN Meds: sodium chloride, ALPRAZolam, HYDROcodone-acetaminophen, levalbuterol, LORazepam, nitroGLYCERIN, ondansetron (ZOFRAN) IV, sodium chloride flush, sodium chloride flush   Vital Signs    Vitals:   05/22/16 2152 05/23/16 0518 05/23/16 0746 05/23/16 0747  BP: 106/63 109/84    Pulse: 73 79    Resp: (!) 23 (!) 23    Temp: 97.5 F (36.4 C) 97.6 F (36.4 C)    TempSrc: Oral Oral    SpO2: 100% 95% 97% 97%  Weight:      Height:        Intake/Output Summary (Last 24 hours) at 05/23/16 1121 Last data filed at 05/23/16 0917  Gross per 24 hour  Intake              362 ml  Output                0 ml  Net              362 ml   Filed Weights   05/20/16 2135 05/21/16 0545 05/22/16 0518  Weight: 182 lb 1.6 oz (82.6 kg) 182 lb (82.6 kg) 177 lb 6.4 oz (80.5 kg)    Physical Exam    GEN: Well nourished, well developed, in no acute distress.  HEENT: Grossly normal.  Neck: Supple, no JVD, carotid bruits, or masses. Cardiac: RRR, no murmurs, rubs,  or gallops. No clubbing, cyanosis, edema.  Radials/DP/PT 2+ and equal bilaterally. Healthy subclavian device site. Respiratory:  Respirations regular and unlabored, clear to auscultation bilaterally. GI: Soft, nontender, nondistended, BS + x 4. MS: no deformity or atrophy. Skin: warm and dry, no rash. Neuro:  Strength and sensation are intact. Psych: AAOx3. Calm and relaxed. Normal affect today.  Labs    CBC  Recent Labs  05/22/16 0627 05/23/16 0456  WBC 20.9* 20.5*  HGB 13.8 14.5  HCT 43.2 45.2  MCV 95.4 95.2  PLT 343 [id number]   Basic Metabolic Panel  Recent Labs  05/22/16 0627 05/23/16 0456  NA 141 140  K 3.6 3.4*  CL 99* 96*  CO2 30 31  GLUCOSE 80 90  BUN 24* 27*  CREATININE 1.27* 1.21*  CALCIUM 9.0 9.6   Liver Function Tests  Recent Labs  05/22/16 0627 05/23/16 0456  AST 32 39  ALT 24 30  ALKPHOS 57 64  BILITOT 0.9 0.8  PROT 5.8* 6.3*  ALBUMIN 3.2* 3.4*     Telemetry    NSR w CRT (A sensed -BiV paced) - Personally Reviewed  Cardiac Studies      Post-Intervention Diagram     Implants  Permanent Stent  Stent Resolute Onyx3.0x38 [id number] - Implanted   Inventory item: Stent Resolute Onyx3.0x38 Model/Cat number: [id number]  Manufacturer: [place] Lot number: [id number]  Device identifier: [id number] Device identifier type: [id number]  GUDID Information   Request status Successful    Brand name: Resolute Onyx Version/Model: [id number]  Company name: [employer] MRI safety info as of 05/20/16: MR Conditional  Contains dry or latex rubber: No    GMDN P.T. name: Drug-eluting coronary artery stent, non-bioabsorbable-polymer-coated    As of 05/20/2016   Status: Implanted      Stent Resolute Onyx 3.0x30 - [id number] - Implanted   Inventory item: Stent Resolute Onyx 3.0x30 Model/Cat number: [id number]  Manufacturer: [place] Lot number: [id number]  Device identifier: [id number] Device identifier type: [id number]  GUDID Information   Request status Successful    Brand name: Resolute Onyx Version/Model: [id number]  Company name: [employer] MRI safety info as of 05/20/16: MR Conditional  Contains dry or latex rubber: No    GMDN P.T. name: Drug-eluting coronary artery stent, non-bioabsorbable-polymer-coated       Echo 05/15/16 - Left ventricle: The cavity size was normal. Systolic function was   severely reduced. The estimated ejection fraction was in the   range of 25% to 30%. Diffuse hypokinesis. Features are consistent   with a pseudonormal left ventricular filling pattern, with   concomitant abnormal relaxation and increased filling pressure   (grade 2 diastolic dysfunction). Doppler parameters are   consistent with elevated ventricular end-diastolic filling   pressure. - Ventricular septum: The contour showed diastolic flattening and    systolic flattening. - Aortic valve: Trileaflet; mildly thickened, mildly calcified   leaflets. There was no regurgitation. - Aortic root: The aortic root was normal in size. - Mitral valve: Structurally normal valve. There was moderate to   severe regurgitation. - Left atrium: The atrium was moderately dilated. - Right ventricle: The cavity size was normal. Wall thickness was   normal. Systolic function was normal. - Right atrium: Pacer wire or catheter noted in right atrium. - Tricuspid valve: There was moderate regurgitation. - Pulmonic valve: Transvalvular velocity was within the normal   range. There was no evidence for stenosis. - Pulmonary arteries: Systolic pressure was moderately to severely   increased.  PA peak pressure: 52 mm Hg (S). - Pericardium, extracardiac: There was no pericardial effusion.  Patient Profile     Ms. Hibma is a 80 year old female with a past medical history of ischemic/nonischemic cardiomyophathy (mixed etiology) s/p CRT-D, carotid artery disease, COPD, HLD, and DM. She presented to the ED with altered mental status, and ruled in for NSTEMI.   Evidence of acute on chronic CHF by Optivol and confirmed by [facility], improved with diuresis. Progression of CAD by [employer], now s/p DES to RCA and LCX on 12/5  Assessment & Plan    1. CAD s/p NSTEMI s/p DES LCX and RCA: DAPT with ASA and Brilinta 12 months.Has moderate LAD stenosis, not far from ostium.Reinforced the critical importance of compliance with dual antiplatelet therapy (ASA+Brilinta) for next 12 months. Will arrange early f/u. 2. Acute on chronic systolic and diastolic HF: Seems to be back to "dry weight". Prior to admission Optivol was up, but this may have been due to worsening ischemia, rather than volume overload. Stable on po Lasix today. TOC f/u in a week. Enroll in CHF device follow up. On beta blocker. ACEi was stopped in September due to renal insufficiency. BP low normal now. Reevaluate for use of  vasodilators at next office visit (ACEi/ARB if renal function normal, Bidil if not). 3. Acute respiratory failure/COPD exacerbation: Seems to be resolved.  Metoprolol changed to bisoprolol for better cardioselectivity. 4. Acute renal insufficiency: Stable, creatinine at/close to baseline.  OK to DC from Cardiology point of view. F/U appt scheduled.  Signed, [name], MD  05/23/2016, 11:21 AM

## 2016-05-23 NOTE — Clinical Social Work Placement (Signed)
   CLINICAL SOCIAL WORK PLACEMENT  NOTE  Date:  05/23/2016  Patient Details  Name: Sydney Phillips MRN: [id number] Date of Birth: Aug 07, 1935  Clinical Social Work is seeking post-discharge placement for this patient at the [place] level of care (*CSW will initial, date and re-position this form in  chart as items are completed):  Yes   Patient/family provided with [place] Work Department's list of facilities offering this level of care within the geographic area requested by the patient (or if unable, by the patient's family).  Yes   Patient/family informed of their freedom to choose among providers that offer the needed level of care, that participate in Medicare, Medicaid or managed care program needed by the patient, have an available bed and are willing to accept the patient.  Yes   Patient/family informed of 's ownership interest in [facility] and [facility], as well as of the fact that they are under no obligation to receive care at these facilities.  PASRR submitted to EDS on 05/22/16     PASRR number received on 05/22/16     Existing PASRR number confirmed on       FL2 transmitted to all facilities in geographic area requested by pt/family on 05/22/16     FL2 transmitted to all facilities within larger geographic area on       Patient informed that his/her managed care company has contracts with or will negotiate with certain facilities, including the following:        Yes   Patient/family informed of bed offers received.  Patient chooses bed at [place]     Physician recommends and patient chooses bed at      Patient to be transferred to [facility] on  .  Patient to be transferred to facility by Ambulance     Patient family notified on 05/23/16 of transfer.  Name of family member notified:  [name]     PHYSICIAN Please sign FL2, Please prepare priority discharge  summary, including medications, Please prepare prescriptions     Additional Comment:    _______________________________________________ Rigoberto Noel, LCSW 05/23/2016, 4:08 PM

## 2016-05-26 ENCOUNTER — Other Ambulatory Visit: Payer: Self-pay | Admitting: *Deleted

## 2016-05-26 ENCOUNTER — Encounter: Payer: Self-pay | Admitting: Internal Medicine

## 2016-05-26 ENCOUNTER — Non-Acute Institutional Stay (SKILLED_NURSING_FACILITY): Payer: Medicare Other | Admitting: Internal Medicine

## 2016-05-26 ENCOUNTER — Encounter: Payer: Medicare Other | Admitting: Cardiovascular Disease

## 2016-05-26 DIAGNOSIS — F418 Other specified anxiety disorders: Secondary | ICD-10-CM | POA: Diagnosis not present

## 2016-05-26 DIAGNOSIS — I739 Peripheral vascular disease, unspecified: Secondary | ICD-10-CM | POA: Diagnosis not present

## 2016-05-26 DIAGNOSIS — Z95810 Presence of automatic (implantable) cardiac defibrillator: Secondary | ICD-10-CM

## 2016-05-26 DIAGNOSIS — J438 Other emphysema: Secondary | ICD-10-CM

## 2016-05-26 DIAGNOSIS — I252 Old myocardial infarction: Secondary | ICD-10-CM | POA: Diagnosis not present

## 2016-05-26 DIAGNOSIS — I2510 Atherosclerotic heart disease of native coronary artery without angina pectoris: Secondary | ICD-10-CM

## 2016-05-26 DIAGNOSIS — E1122 Type 2 diabetes mellitus with diabetic chronic kidney disease: Secondary | ICD-10-CM

## 2016-05-26 DIAGNOSIS — I10 Essential (primary) hypertension: Secondary | ICD-10-CM

## 2016-05-26 DIAGNOSIS — I255 Ischemic cardiomyopathy: Secondary | ICD-10-CM | POA: Diagnosis not present

## 2016-05-26 DIAGNOSIS — Z955 Presence of coronary angioplasty implant and graft: Secondary | ICD-10-CM | POA: Diagnosis not present

## 2016-05-26 DIAGNOSIS — G894 Chronic pain syndrome: Secondary | ICD-10-CM

## 2016-05-26 DIAGNOSIS — Z794 Long term (current) use of insulin: Secondary | ICD-10-CM

## 2016-05-26 DIAGNOSIS — E782 Mixed hyperlipidemia: Secondary | ICD-10-CM

## 2016-05-26 DIAGNOSIS — N183 Chronic kidney disease, stage 3 unspecified: Secondary | ICD-10-CM

## 2016-05-26 MED ORDER — HYDROCODONE-ACETAMINOPHEN 10-325 MG PO TABS: ORAL_TABLET | ORAL | 0 refills | Status: DC

## 2016-05-26 NOTE — Progress Notes (Signed)
Patient ID: Sydney Phillips, female   DOB: 1935-11-10, 80 y.o.   MRN: [id number]    HISTORY AND PHYSICAL   DATE: 05/26/2016  Location:    [place] Room Number: 117 B Place of Service: SNF (31)   Extended Emergency Contact Information Primary Emergency Contact: Burgess,[name] Address: [address]          [place], [place] [address] Johnnette Litter of [place] Mobile Phone: [phone] Relation: Son Secondary Emergency Contact: Arta Bruce [place] of [place] Phone: [phone] Relation: Son  Advanced Directive information Does Patient Have a Medical Advance Directive?: No, Does patient want to make changes to medical advance directive?: No - Patient declined  Chief Complaint  Patient presents with  . New Admit To SNF    HPI:  80 yo female seen today as a new admission into SNF following hospital stay for NSTEMI, CAD s/p DES x2 on 05/20/16, COPD exacerbation, A/CKD, A/C HF, hypokalemia, ICM s/p AICD, left subclavian artery stenosis s/p stent, hyperlipidemia, anxiety d/o, OSA, pulmonary HTN, DM, fall. She presented to the ED after 3 falls at home.Trp 11.34/CK 1073 on admission. Cardio consulted. 2D echo revealed EF 25-30%. Left heart cath 05/20/16 revealed severe 2 vessel disease at RCA and left circumflex--> x 2 DES placed; moderate ostial LAD stenosis; moderate pulmonary HTN; CHF with elevated PCWP and LVEDP. WBC 18.1K-->20.5K; abs Neutrophils 17.9K; K 2.6-->3.4; Cr 1.64-->1.84-->1.21; Trp 11.34-->0.96; ABG 7.4/pCO2 49.6/pO2 73/O2 sats 94%; A1c 6.9% at d/c. MI  tx with DES x 2 and will need ASA with Brilinta x 12 mos at d/c. She presents to SNF for short term rehab.  Today, she reports no concerns. No CP, SOB or palpitations. She feels nauseated. No emesis. Family c/a her memory. She is a poor historian due to memory loss. Hx obtained from chart. She has completed levaquin  CAD/HTN/hyperlipidemia/ICM -she is s/p NSTEMI and cardiac cath with x2 DES inserted at  RCA and LCx respectively. EF 25-30% and she has an AICD. Currently takes ASA/Brilinta (needs x 12 mos), demadex with Kdur, lipitor, bisoprolol and prn SLNTG. LDL 145. followed by cardio  PAD - takes ASA daily. She underwent left subclavian angio with stent 07/31/11.   COPD/emphysema/OSA/allergic rhinitis - stable on duonebs, flonase, mucinex DM and albuterol HFA  Depression/anxiety -stable on cymbalta and prn xanax  Chronic pain syndrome/spinal stenosis/LBP/gout- pain controlled on cymbalta, norco. She takes allopurinol for gout prevention. no recent gout attacks  DM - insulin dependent. A1c 6.9%. CBg 276 today. No low BS reactions. She has CKD  CKD - stage 3. Cr 1.21  Past Medical History:  Diagnosis Date  . Arthritis   . Automatic implantable cardioverter-defibrillator in situ   . Bronchitis   . CAD (coronary artery disease)    Currently angina free, no evidence of reversible ischemia  . Chronic back pain   . Chronic constipation   . Colon polyps 2003.  2015.   HP polyps 2003.  adnomas 2015.  required referal to [facility] for colonoscopic resection of flat polyps.   . Congestive heart failure (CHF) (HCC)    New York Heart Association functional class 2, diastolic dysfunction  . COPD (chronic obstructive pulmonary disease) ([place])   . Depression with anxiety    takes Cymbalta daily  . Diabetes mellitus without complication (HCC)    no meds  . Early cataracts, bilateral   . Fibromyalgia   . GERD (gastroesophageal reflux disease)    was on meds but was taken off;now watches what  she eats  . Hemorrhoids   . History of kidney stones   . Hx of colonic polyps   . Hyperlipemia    takes Crestor daily  . Hypertension    takes Amlodipine and Metoprolol daily  . Insomnia   . LBBB (left bundle branch block)    Stress test 09/03/2010, EF 55  . Myocardial infarction   . PAD (peripheral artery disease) (HCC)    Carotid, subclavian, and lower extremity beds, currently not symptomatic  .  Presence of combination internal cardiac defibrillator (ICD) and pacemaker   . Presence of permanent cardiac pacemaker   . S/P angioplasty with stent, lt. subclavian 07/31/11 08/01/2011  . Shortness of breath    when over exerting self per pt.  . Subclavian arterial stenosis, lt, with PTA/STENT 07/31/11 08/01/2011  . Syncope 07/28/2011   EF - 50-55, moderate concentric hypertrophy in left ventricle  . Urinary incontinence   . Vertigo    takes Meclizine prn    Past Surgical History:  Procedure Laterality Date  . ABDOMINAL AORTAGRAM N/A 08/12/2013   Procedure: ABDOMINAL Maxcine Ham;  Surgeon: Elam Dutch, MD;  Location: [facility] CATH LAB;  Service: Cardiovascular;  Laterality: N/A;  . ABDOMINAL HYSTERECTOMY    . APPENDECTOMY    . BACK SURGERY  2012  . BIV ICD GENERTAOR CHANGE OUT Left 02/20/2012   Procedure: BIV ICD GENERTAOR CHANGE OUT;  Surgeon: [name], MD;  Location: [facility] CATH LAB;  Service: Cardiovascular;  Laterality: Left;  . CARDIAC CATHETERIZATION  12/01/2007   By Dr. Melvern Banker, left heart cath,   . CARDIAC CATHETERIZATION N/A 05/20/2016   Procedure: Right/Left Heart Cath and Coronary Angiography;  Surgeon: Sherren Mocha, MD;  Location: [place] CV LAB;  Service: Cardiovascular;  Laterality: N/A;  . CARDIAC CATHETERIZATION N/A 05/20/2016   Procedure: Coronary Stent Intervention;  Surgeon: Sherren Mocha, MD;  Location: [place] CV LAB;  Service: Cardiovascular;  Laterality: N/A;  . CARDIAC DEFIBRILLATOR PLACEMENT  05/2008   By Dr Blanch Media, [employer] CANNOT HAVE MRI's  . CAROTID ANGIOGRAM N/A 07/31/2011   Procedure: CAROTID ANGIOGRAM;  Surgeon: Lorretta Harp, MD;  Location: [facility] CATH LAB;  Service: Cardiovascular;  Laterality: N/A;  carotid angiogram and possible Lt SCA PTA  . COLONOSCOPY W/ POLYPECTOMY  12/2013  . CORONARY ANGIOPLASTY    . ENDARTERECTOMY Left 11/08/2014   Procedure: LEFT CAROTID ENDARTERECTOMY WITH HEMASHIELD PATCH ANGIOPLASTY;  Surgeon: Elam Dutch, MD;   Location: [facility];  Service: Vascular;  Laterality: Left;  . ESOPHAGOGASTRODUODENOSCOPY N/A 01/20/2014   Procedure: ESOPHAGOGASTRODUODENOSCOPY (EGD);  Surgeon: Jerene Bears, MD;  Location: [facility] ENDOSCOPY;  Service: Endoscopy;  Laterality: N/A;  . FEMORAL-POPLITEAL BYPASS GRAFT Right 10/12/2013   Procedure:   Femoral-Peroneal trunk  bypass with nonreversed greater saphenous vein graft;  Surgeon: Elam Dutch, MD;  Location: [place];  Service: Vascular;  Laterality: Right;  . GIVENS CAPSULE STUDY N/A 01/20/2014   Procedure: GIVENS CAPSULE STUDY;  Surgeon: Jerene Bears, MD;  Location: [facility];  Service: Gastroenterology;  Laterality: N/A;  . INTRAOPERATIVE ARTERIOGRAM Right 10/12/2013   Procedure: INTRA OPERATIVE ARTERIOGRAM;  Surgeon: Elam Dutch, MD;  Location: [place];  Service: Vascular;  Laterality: Right;  . ORIF ELBOW FRACTURE  08/16/2011   Procedure: OPEN REDUCTION INTERNAL FIXATION (ORIF) ELBOW/OLECRANON FRACTURE;  Surgeon: Schuyler Amor, MD;  Location: [facility];  Service: Orthopedics;  Laterality: Left;  . RENAL ANGIOGRAM N/A 08/12/2013   Procedure: RENAL ANGIOGRAM;  Surgeon: Elam Dutch, MD;  Location: [place] CATH LAB;  Service: Cardiovascular;  Laterality: N/A;  . SUBCLAVIAN STENT PLACEMENT Left 07/31/2011   7x18 Genesis, balloon, with reduction of 90% ostial left subclavian artery stenosis to 0% with residual excellent flow  . TONSILLECTOMY    . TUBAL LIGATION      Patient Care Team: Laurey Morale, MD as PCP - General Lelon Perla, MD as Consulting Physician (Cardiology) Jerene Bears, MD as Consulting Physician (Gastroenterology) Loletta Specter, RN as [place] D Matthews, RN as [place] [facility]  Social History   Social History  . Marital status: Widowed    Spouse name: N/A  . Number of children: 3  . Years of education: N/A   Occupational History  . Not on file.   Social History Main Topics  .  Smoking status: Current Every Day Smoker    Packs/day: 1.00    Years: 62.00    Types: Cigarettes  . Smokeless tobacco: Never Used     Comment: Down to 3-4 cigarettes per day  . Alcohol use No  . Drug use: No  . Sexual activity: No   Other Topics Concern  . Not on file   Social History Narrative   Ok to share information with medical POA, Son Gabriel Carina     reports that she has been smoking Cigarettes.  She has a 62.00 pack-year smoking history. She has never used smokeless tobacco. She reports that she does not drink alcohol or use drugs.  Family History  Problem Relation Age of Onset  . CAD Father   . Heart disease Father   . Hyperlipidemia Father   . Heart disease Mother   . Deep vein thrombosis Son   . Hyperlipidemia    . Colon cancer Maternal Grandmother   . Cancer Sister     ovarian  . Diabetes Sister   . Heart disease Sister   . Anesthesia problems Neg Hx   . Hypotension Neg Hx   . Malignant hyperthermia Neg Hx   . Pseudochol deficiency Neg Hx    Family Status  Relation Status  . Father Deceased  . Mother Deceased  . Son Alive  .    Marland Kitchen Maternal Grandmother   . Sister   . Neg Hx     Immunization History  Administered Date(s) Administered  . Influenza Split 06/03/2011, 05/06/2012  . Influenza Whole 05/17/2003, 04/02/2009  . Influenza, High Dose Seasonal PF 05/30/2015  . Influenza,inj,Quad PF,36+ Mos 04/20/2013, 05/02/2014, 02/27/2016  . Pneumococcal Conjugate-13 09/07/2010  . Pneumococcal Polysaccharide-23 10/13/2013    Allergies  Allergen Reactions  . Potassium-Containing Compounds Other (See Comments)    Causes severe constipation  . Azithromycin Rash  . Codeine Itching  . Darvon Itching  . Erythromycin Rash  . Meloxicam Other (See Comments)    Unknown    . Norco [Hydrocodone-Acetaminophen] Itching  . Penicillins Rash    Has patient had a PCN reaction causing immediate rash, facial/tongue/throat swelling, SOB or lightheadedness with  hypotension: unknown Has patient had a PCN reaction causing severe rash involving mucus membranes or skin necrosis: no Has patient had a PCN reaction that required hospitalization: unknown Has patient had a PCN reaction occurring within the last 10 years: no If all of the above answers are "NO", then may proceed with Cephalosporin use.   Marland Kitchen Propoxyphene N-Acetaminophen Itching  . Rofecoxib Other (See Comments)    Unknown    . Rosuvastatin Other (See Comments)  cramps  . Statins Itching and Other (See Comments)    Sleeplessness  . Sulfa Antibiotics Rash    Medications: Patient's Medications  New Prescriptions   No medications on file  Previous Medications   ALBUTEROL (PROVENTIL HFA;VENTOLIN HFA) 108 (90 BASE) MCG/ACT INHALER    Inhale 2 puffs into the lungs every 4 (four) hours as needed for wheezing or shortness of breath.   ALLOPURINOL (ZYLOPRIM) 300 MG TABLET    Take 1 tablet (300 mg total) by mouth daily.   ALPRAZOLAM (XANAX) 0.5 MG TABLET    Take 1 tablet (0.5 mg total) by mouth 3 (three) times daily as needed for anxiety.   ASPIRIN 81 MG CHEWABLE TABLET    Chew 1 tablet (81 mg total) by mouth daily.   ATORVASTATIN (LIPITOR) 40 MG TABLET    Take 1 tablet (40 mg total) by mouth daily at 6 PM.   BISOPROLOL (ZEBETA) 5 MG TABLET    Take 1 tablet (5 mg total) by mouth daily.   DEXTROMETHORPHAN-GUAIFENESIN (MUCINEX DM) 30-600 MG 12HR TABLET    Take 1 tablet by mouth 2 (two) times daily.   DULOXETINE (CYMBALTA) 60 MG CAPSULE    TAKE ONE CAPSULE BY MOUTH TWICE DAILY   FLUTICASONE (CLARISPRAY) 50 MCG/ACT NASAL SPRAY    Place 1 spray into both nostrils daily.   FLUTICASONE (FLONASE) 50 MCG/ACT NASAL SPRAY    Place 1 spray into both nostrils daily as needed for allergies or rhinitis.   HYDROCODONE-ACETAMINOPHEN (NORCO) 10-325 MG TABLET    Take 1 tablet by mouth every 6 (six) hours as needed for moderate pain.   INSULIN GLARGINE (LANTUS) 100 UNIT/ML INJECTION    Inject 10 Units into the  skin at bedtime.   IPRATROPIUM-ALBUTEROL (DUONEB) 0.5-2.5 (3) MG/3ML SOLN    Take 3 mLs by nebulization 3 (three) times daily.   LEVOFLOXACIN (LEVAQUIN) 750 MG TABLET    Take 1 tablet (750 mg total) by mouth every other day.   NITROGLYCERIN (NITROSTAT) 0.4 MG SL TABLET    Place 1 tablet (0.4 mg total) under the tongue every 5 (five) minutes x 3 doses as needed for chest pain.   POTASSIUM CHLORIDE SA (K-DUR,KLOR-CON) 20 MEQ TABLET    Take 1 tablet (20 mEq total) by mouth daily.   TICAGRELOR (BRILINTA) 90 MG TABS TABLET    Take 1 tablet (90 mg total) by mouth 2 (two) times daily.   TORSEMIDE (DEMADEX) 20 MG TABLET    Take 20 mg by mouth 2 (two) times daily.  Modified Medications   No medications on file  Discontinued Medications   ACCU-CHEK FASTCLIX LANCETS MISC    USE   TO CHECK GLUCOSE ONCE DAILY   ACCU-CHEK SMARTVIEW TEST STRIP    CHECK BLOOD SUGAR ONCE DAILY   FUROSEMIDE (LASIX) 40 MG TABLET    Take 1 tablet (40 mg total) by mouth 2 (two) times daily.   INSULIN GLARGINE (LANTUS) 100 UNIT/ML INJECTION    Inject 0.12 mLs (12 Units total) into the skin daily.   NICOTINE (NICODERM CQ - DOSED IN MG/24 HOURS) 14 MG/24HR PATCH    Place 1 patch (14 mg total) onto the skin daily.   PREDNISONE (DELTASONE) 5 MG TABLET    4 tablets 3 days, 3 tablets 3 days, 2 tablets 3 days, 1 tablet 3 days, then DC    Review of Systems  Unable to perform ROS: Other (memory loss)    Vitals:   05/26/16 0936  BP: 110/65  Pulse:  84  Resp: 18  Temp: 97.7 F (36.5 C)  TempSrc: Oral  SpO2: 96%  Weight: 171 lb 9.6 oz (77.8 kg)  Height: 5' 5" (1.651 m)   Body mass index is 28.56 kg/m.  Physical Exam  Constitutional: She appears well-developed.  Looks frail in NAD.  [place] O2 intact. Sitting on  edge of bed  HENT:  Mouth/Throat: Oropharynx is clear and moist. No oropharyngeal exudate.  MMM. No oral thrush  Eyes: Pupils are equal, round, and reactive to light. No scleral icterus.  Neck: Neck supple. Carotid  bruit is not present. No tracheal deviation present.  Cardiovascular: Normal rate, regular rhythm and intact distal pulses.  Exam reveals no gallop and no friction rub.   Murmur (1/6 SEM) heard. No LE edema b/l. no calf TTP.   Pulmonary/Chest: Effort normal and breath sounds normal. No stridor. No respiratory distress. She has no wheezes. She has no rales.  Reduced BS at base b/l. No wheezing  Abdominal: Soft. Bowel sounds are normal. She exhibits no distension and no mass. There is no hepatomegaly. There is no tenderness. There is no rebound and no guarding.  Musculoskeletal: She exhibits edema.  Lymphadenopathy:    She has no cervical adenopathy.  Neurological: She is alert.  Skin: Skin is warm and dry. No rash noted.  Contusions on UE b/l  Psychiatric: She has a normal mood and affect. Her behavior is normal. Thought content is delusional.     Labs reviewed: Admission on 05/14/2016, Discharged on 05/23/2016  No results displayed because visit has over 200 results.  CBC Latest Ref Rng & Units 05/23/2016 05/22/2016 05/21/2016  WBC 4.0 - 10.5 K/uL 20.5(H) 20.9(H) 21.1(H)  Hemoglobin 12.0 - 15.0 g/dL 14.5 13.8 12.5  Hematocrit 36.0 - 46.0 % 45.2 43.2 39.5  Platelets 150 - 400 K/uL 326 343 330   CMP Latest Ref Rng & Units 05/23/2016 05/22/2016 05/21/2016  Glucose 65 - 99 mg/dL 90 80 76  BUN 6 - 20 mg/dL 27(H) 24(H) 32(H)  Creatinine 0.44 - 1.00 mg/dL 1.21(H) 1.27(H) 1.42(H)  Sodium 135 - 145 mmol/L 140 141 143  Potassium 3.5 - 5.1 mmol/L 3.4(L) 3.6 4.0  Chloride 101 - 111 mmol/L 96(L) 99(L) 101  CO2 22 - 32 mmol/L 31 30 33(H)  Calcium 8.9 - 10.3 mg/dL 9.6 9.0 9.2  Total Protein 6.5 - 8.1 g/dL 6.3(L) 5.8(L) -  Total Bilirubin 0.3 - 1.2 mg/dL 0.8 0.9 -  Alkaline Phos 38 - 126 U/L 64 57 -  AST 15 - 41 U/L 39 32 -  ALT 14 - 54 U/L 30 24 -   Lab Results  Component Value Date   HGBA1C 6.9 (H) 05/15/2016   Lipid Panel     Component Value Date/Time   CHOL 193 05/15/2016 0248   TRIG  116 05/15/2016 0248   HDL 25 (L) 05/15/2016 0248   CHOLHDL 7.7 05/15/2016 0248   VLDL 23 05/15/2016 0248   LDLCALC 145 (H) 05/15/2016 0248   LDLDIRECT 152.0 05/30/2015 1452     Office Visit on 05/02/2016  Component Date Value Ref Range Status  . WBC 05/02/2016 8.1  4.0 - 10.5 K/uL Final  . RBC 05/02/2016 4.47  3.87 - 5.11 Mil/uL Final  . Hemoglobin 05/02/2016 13.9  12.0 - 15.0 g/dL Final  . HCT 05/02/2016 43.0  36.0 - 46.0 % Final  . MCV 05/02/2016 96.2  78.0 - 100.0 fl Final  . MCHC 05/02/2016 32.5  30.0 - 36.0 g/dL Final  .  RDW 05/02/2016 15.4  11.5 - 15.5 % Final  . Platelets 05/02/2016 684.0 Repeated and verified X2.* 150.0 - 400.0 K/uL Final  . Neutrophils Relative % 05/02/2016 63.5  43.0 - 77.0 % Final  . Lymphocytes Relative 05/02/2016 22.3  12.0 - 46.0 % Final  . Monocytes Relative 05/02/2016 8.5  3.0 - 12.0 % Final  . Eosinophils Relative 05/02/2016 5.3* 0.0 - 5.0 % Final  . Basophils Relative 05/02/2016 0.4  0.0 - 3.0 % Final  . Neutro Abs 05/02/2016 5.2  1.4 - 7.7 K/uL Final  . Lymphs Abs 05/02/2016 1.8  0.7 - 4.0 K/uL Final  . Monocytes Absolute 05/02/2016 0.7  0.1 - 1.0 K/uL Final  . Eosinophils Absolute 05/02/2016 0.4  0.0 - 0.7 K/uL Final  . Basophils Absolute 05/02/2016 0.0  0.0 - 0.1 K/uL Final  Office Visit on 04/16/2016  Component Date Value Ref Range Status  . Sodium 04/17/2016 135  135 - 145 mEq/L Final  . Potassium 04/17/2016 3.9  3.5 - 5.1 mEq/L Final  . Chloride 04/17/2016 91* 96 - 112 mEq/L Final  . CO2 04/17/2016 30  19 - 32 mEq/L Final  . Glucose, Bld 04/17/2016 290* 70 - 99 mg/dL Final  . BUN 04/17/2016 58* 6 - 23 mg/dL Final  . Creatinine, Ser 04/17/2016 1.92* 0.40 - 1.20 mg/dL Final  . Calcium 04/17/2016 10.2  8.4 - 10.5 mg/dL Final  . GFR 04/17/2016 26.83* >60.00 mL/min Final  . WBC 04/17/2016 24.4 Repeated and verified X2.* 4.0 - 10.5 K/uL Final  . RBC 04/17/2016 5.12* 3.87 - 5.11 Mil/uL Final  . Hemoglobin 04/17/2016 16.4* 12.0 - 15.0 g/dL  Final  . HCT 04/17/2016 49.4* 36.0 - 46.0 % Final  . MCV 04/17/2016 96.5  78.0 - 100.0 fl Final  . MCHC 04/17/2016 33.1  30.0 - 36.0 g/dL Final  . RDW 04/17/2016 15.1  11.5 - 15.5 % Final  . Platelets 04/17/2016 338.0  150.0 - 400.0 K/uL Final  . Neutrophils Relative % 04/17/2016 91.7* 43.0 - 77.0 % Final  . Lymphocytes Relative 04/17/2016 5.0* 12.0 - 46.0 % Final  . Monocytes Relative 04/17/2016 3.1  3.0 - 12.0 % Final  . Eosinophils Relative 04/17/2016 0.0  0.0 - 5.0 % Final  . Basophils Relative 04/17/2016 0.2  0.0 - 3.0 % Final  . Neutro Abs 04/17/2016 22.4* 1.4 - 7.7 K/uL Final  . Lymphs Abs 04/17/2016 1.2  0.7 - 4.0 K/uL Final  . Monocytes Absolute 04/17/2016 0.8  0.1 - 1.0 K/uL Final  . Eosinophils Absolute 04/17/2016 0.0  0.0 - 0.7 K/uL Final  . Basophils Absolute 04/17/2016 0.1  0.0 - 0.1 K/uL Final  Admission on 04/06/2016, Discharged on 04/11/2016  Component Date Value Ref Range Status  . WBC 04/06/2016 12.3* 4.0 - 10.5 K/uL Final  . RBC 04/06/2016 4.13  3.87 - 5.11 MIL/uL Final  . Hemoglobin 04/06/2016 13.5  12.0 - 15.0 g/dL Final  . HCT 04/06/2016 39.8  36.0 - 46.0 % Final  . MCV 04/06/2016 96.4  78.0 - 100.0 fL Final  . MCH 04/06/2016 32.7  26.0 - 34.0 pg Final  . MCHC 04/06/2016 33.9  30.0 - 36.0 g/dL Final  . RDW 04/06/2016 14.7  11.5 - 15.5 % Final  . Platelets 04/06/2016 312  150 - 400 K/uL Final  . Neutrophils Relative % 04/06/2016 56  % Final  . Neutro Abs 04/06/2016 6.9  1.7 - 7.7 K/uL Final  . Lymphocytes Relative 04/06/2016 33  % Final  .  Lymphs Abs 04/06/2016 4.0  0.7 - 4.0 K/uL Final  . Monocytes Relative 04/06/2016 8  % Final  . Monocytes Absolute 04/06/2016 1.0  0.1 - 1.0 K/uL Final  . Eosinophils Relative 04/06/2016 3  % Final  . Eosinophils Absolute 04/06/2016 0.3  0.0 - 0.7 K/uL Final  . Basophils Relative 04/06/2016 0  % Final  . Basophils Absolute 04/06/2016 0.0  0.0 - 0.1 K/uL Final  . Sodium 04/06/2016 141  135 - 145 mmol/L Final  . Potassium  04/06/2016 3.8  3.5 - 5.1 mmol/L Final  . Chloride 04/06/2016 112* 101 - 111 mmol/L Final  . CO2 04/06/2016 22  22 - 32 mmol/L Final  . Glucose, Bld 04/06/2016 127* 65 - 99 mg/dL Final  . BUN 04/06/2016 9  6 - 20 mg/dL Final  . Creatinine, Ser 04/06/2016 1.08* 0.44 - 1.00 mg/dL Final  . Calcium 04/06/2016 9.0  8.9 - 10.3 mg/dL Final  . GFR calc non Af Amer 04/06/2016 48* >60 mL/min Final  . GFR calc Af Amer 04/06/2016 55* >60 mL/min Final   Comment: (NOTE) The eGFR has been calculated using the CKD EPI equation. This calculation has not been validated in all clinical situations. eGFR's persistently <60 mL/min signify possible Chronic Kidney Disease.   . Anion gap 04/06/2016 7  5 - 15 Final  . Troponin i, poc 04/06/2016 0.01  0.00 - 0.08 ng/mL Final  . Comment 3 04/06/2016          Final   Comment: Due to the release kinetics of cTnI, a negative result within the first hours of the onset of symptoms does not rule out myocardial infarction with certainty. If myocardial infarction is still suspected, repeat the test at appropriate intervals.   . B Natriuretic Peptide 04/06/2016 308.6* 0.0 - 100.0 pg/mL Final  . pH, Arterial 04/06/2016 7.336* 7.350 - 7.450 Final  . pCO2 arterial 04/06/2016 36.5  32.0 - 48.0 mmHg Final  . pO2, Arterial 04/06/2016 87.0  83.0 - 108.0 mmHg Final  . Bicarbonate 04/06/2016 19.5* 20.0 - 28.0 mmol/L Final  . TCO2 04/06/2016 21  0 - 100 mmol/L Final  . O2 Saturation 04/06/2016 96.0  % Final  . Acid-base deficit 04/06/2016 6.0* 0.0 - 2.0 mmol/L Final  . Patient temperature 04/06/2016 98.6 F   Final  . Collection site 04/06/2016 RADIAL, ALLEN'S TEST ACCEPTABLE   Final  . Drawn by 04/06/2016 Operator   Final  . Sample type 04/06/2016 ARTERIAL   Final  . Troponin I 04/06/2016 <0.03  <0.03 ng/mL Final  . Troponin I 04/06/2016 <0.03  <0.03 ng/mL Final  . MRSA by PCR 04/06/2016 NEGATIVE  NEGATIVE Final   Comment:        The GeneXpert MRSA Assay (FDA approved  for NASAL specimens only), is one component of a comprehensive MRSA colonization surveillance program. It is not intended to diagnose MRSA infection nor to guide or monitor treatment for MRSA infections.   . Glucose-Capillary 04/06/2016 333* 65 - 99 mg/dL Final  . Sodium 04/07/2016 141  135 - 145 mmol/L Final  . Potassium 04/07/2016 4.0  3.5 - 5.1 mmol/L Final  . Chloride 04/07/2016 108  101 - 111 mmol/L Final  . CO2 04/07/2016 23  22 - 32 mmol/L Final  . Glucose, Bld 04/07/2016 154* 65 - 99 mg/dL Final  . BUN 04/07/2016 14  6 - 20 mg/dL Final  . Creatinine, Ser 04/07/2016 1.32* 0.44 - 1.00 mg/dL Final  . Calcium 04/07/2016 9.1  8.9 -  10.3 mg/dL Final  . GFR calc non Af Amer 04/07/2016 38* >60 mL/min Final  . GFR calc Af Amer 04/07/2016 44* >60 mL/min Final   Comment: (NOTE) The eGFR has been calculated using the CKD EPI equation. This calculation has not been validated in all clinical situations. eGFR's persistently <60 mL/min signify possible Chronic Kidney Disease.   . Anion gap 04/07/2016 10  5 - 15 Final  . WBC 04/07/2016 15.1* 4.0 - 10.5 K/uL Final  . RBC 04/07/2016 3.89  3.87 - 5.11 MIL/uL Final  . Hemoglobin 04/07/2016 12.3  12.0 - 15.0 g/dL Final  . HCT 04/07/2016 37.6  36.0 - 46.0 % Final  . MCV 04/07/2016 96.7  78.0 - 100.0 fL Final  . MCH 04/07/2016 31.6  26.0 - 34.0 pg Final  . MCHC 04/07/2016 32.7  30.0 - 36.0 g/dL Final  . RDW 04/07/2016 14.9  11.5 - 15.5 % Final  . Platelets 04/07/2016 336  150 - 400 K/uL Final  . Glucose-Capillary 04/06/2016 267* 65 - 99 mg/dL Final  . Glucose-Capillary 04/07/2016 160* 65 - 99 mg/dL Final  . Glucose-Capillary 04/07/2016 180* 65 - 99 mg/dL Final  . D-Dimer, Quant 04/07/2016 2.46* 0.00 - 0.50 ug/mL-FEU Final   Comment: (NOTE) At the manufacturer cut-off of 0.50 ug/mL FEU, this assay has been documented to exclude PE with a sensitivity and negative predictive value of 97 to 99%.  At this time, this assay has not been  approved by the FDA to exclude DVT/VTE. Results should be correlated with clinical presentation.   . Adenovirus 04/07/2016 NOT DETECTED  NOT DETECTED Final  . Coronavirus 229E 04/07/2016 NOT DETECTED  NOT DETECTED Final  . Coronavirus HKU1 04/07/2016 NOT DETECTED  NOT DETECTED Final  . Coronavirus NL63 04/07/2016 NOT DETECTED  NOT DETECTED Final  . Coronavirus OC43 04/07/2016 NOT DETECTED  NOT DETECTED Final  . Metapneumovirus 04/07/2016 NOT DETECTED  NOT DETECTED Final  . Rhinovirus / Enterovirus 04/07/2016 NOT DETECTED  NOT DETECTED Final  . Influenza A 04/07/2016 NOT DETECTED  NOT DETECTED Final  . Influenza B 04/07/2016 NOT DETECTED  NOT DETECTED Final  . Parainfluenza Virus 1 04/07/2016 NOT DETECTED  NOT DETECTED Final  . Parainfluenza Virus 2 04/07/2016 NOT DETECTED  NOT DETECTED Final  . Parainfluenza Virus 3 04/07/2016 NOT DETECTED  NOT DETECTED Final  . Parainfluenza Virus 4 04/07/2016 NOT DETECTED  NOT DETECTED Final  . Respiratory Syncytial Virus 04/07/2016 NOT DETECTED  NOT DETECTED Final  . Bordetella pertussis 04/07/2016 NOT DETECTED  NOT DETECTED Final  . Chlamydophila pneumoniae 04/07/2016 NOT DETECTED  NOT DETECTED Final  . Mycoplasma pneumoniae 04/07/2016 NOT DETECTED  NOT DETECTED Final  . Glucose-Capillary 04/07/2016 139* 65 - 99 mg/dL Final  . WBC 04/08/2016 31.3* 4.0 - 10.5 K/uL Final  . RBC 04/08/2016 3.97  3.87 - 5.11 MIL/uL Final  . Hemoglobin 04/08/2016 12.8  12.0 - 15.0 g/dL Final  . HCT 04/08/2016 38.2  36.0 - 46.0 % Final  . MCV 04/08/2016 96.2  78.0 - 100.0 fL Final  . MCH 04/08/2016 32.2  26.0 - 34.0 pg Final  . MCHC 04/08/2016 33.5  30.0 - 36.0 g/dL Final  . RDW 04/08/2016 14.9  11.5 - 15.5 % Final  . Platelets 04/08/2016 360  150 - 400 K/uL Final  . Sodium 04/08/2016 138  135 - 145 mmol/L Final  . Potassium 04/08/2016 3.9  3.5 - 5.1 mmol/L Final  . Chloride 04/08/2016 106  101 - 111 mmol/L Final  .  CO2 04/08/2016 22  22 - 32 mmol/L Final  . Glucose,  Bld 04/08/2016 150* 65 - 99 mg/dL Final  . BUN 04/08/2016 31* 6 - 20 mg/dL Final  . Creatinine, Ser 04/08/2016 1.50* 0.44 - 1.00 mg/dL Final  . Calcium 04/08/2016 9.0  8.9 - 10.3 mg/dL Final  . Total Protein 04/08/2016 6.6  6.5 - 8.1 g/dL Final  . Albumin 04/08/2016 3.7  3.5 - 5.0 g/dL Final  . AST 04/08/2016 26  15 - 41 U/L Final  . ALT 04/08/2016 16  14 - 54 U/L Final  . Alkaline Phosphatase 04/08/2016 74  38 - 126 U/L Final  . Total Bilirubin 04/08/2016 0.4  0.3 - 1.2 mg/dL Final  . GFR calc non Af Amer 04/08/2016 32* >60 mL/min Final  . GFR calc Af Amer 04/08/2016 37* >60 mL/min Final   Comment: (NOTE) The eGFR has been calculated using the CKD EPI equation. This calculation has not been validated in all clinical situations. eGFR's persistently <60 mL/min signify possible Chronic Kidney Disease.   . Anion gap 04/08/2016 10  5 - 15 Final  . Glucose-Capillary 04/07/2016 138* 65 - 99 mg/dL Final  . Glucose-Capillary 04/08/2016 153* 65 - 99 mg/dL Final  . Glucose-Capillary 04/08/2016 154* 65 - 99 mg/dL Final  . Sodium 04/09/2016 140  135 - 145 mmol/L Final  . Potassium 04/09/2016 4.4  3.5 - 5.1 mmol/L Final  . Chloride 04/09/2016 102  101 - 111 mmol/L Final  . CO2 04/09/2016 26  22 - 32 mmol/L Final  . Glucose, Bld 04/09/2016 151* 65 - 99 mg/dL Final  . BUN 04/09/2016 47* 6 - 20 mg/dL Final  . Creatinine, Ser 04/09/2016 1.59* 0.44 - 1.00 mg/dL Final  . Calcium 04/09/2016 9.4  8.9 - 10.3 mg/dL Final  . Total Protein 04/09/2016 6.7  6.5 - 8.1 g/dL Final  . Albumin 04/09/2016 3.8  3.5 - 5.0 g/dL Final  . AST 04/09/2016 26  15 - 41 U/L Final  . ALT 04/09/2016 19  14 - 54 U/L Final  . Alkaline Phosphatase 04/09/2016 77  38 - 126 U/L Final  . Total Bilirubin 04/09/2016 0.6  0.3 - 1.2 mg/dL Final  . GFR calc non Af Amer 04/09/2016 30* >60 mL/min Final  . GFR calc Af Amer 04/09/2016 35* >60 mL/min Final   Comment: (NOTE) The eGFR has been calculated using the CKD EPI equation. This  calculation has not been validated in all clinical situations. eGFR's persistently <60 mL/min signify possible Chronic Kidney Disease.   . Anion gap 04/09/2016 12  5 - 15 Final  . Glucose-Capillary 04/08/2016 170* 65 - 99 mg/dL Final  . Glucose-Capillary 04/09/2016 235* 65 - 99 mg/dL Final  . TSH 04/09/2016 0.354  0.350 - 4.500 uIU/mL Final  . Free T4 04/09/2016 1.03  0.61 - 1.12 ng/dL Final   Comment: (NOTE) Biotin ingestion may interfere with free T4 tests. If the results are inconsistent with the TSH level, previous test results, or the clinical presentation, then consider biotin interference. If needed, order repeat testing after stopping biotin.   . Glucose-Capillary 04/09/2016 197* 65 - 99 mg/dL Final  . Glucose-Capillary 04/09/2016 157* 65 - 99 mg/dL Final  . Sodium 04/10/2016 138  135 - 145 mmol/L Final  . Potassium 04/10/2016 4.2  3.5 - 5.1 mmol/L Final  . Chloride 04/10/2016 97* 101 - 111 mmol/L Final  . CO2 04/10/2016 29  22 - 32 mmol/L Final  . Glucose, Bld 04/10/2016 159* 65 -  99 mg/dL Final  . BUN 04/10/2016 52* 6 - 20 mg/dL Final  . Creatinine, Ser 04/10/2016 1.46* 0.44 - 1.00 mg/dL Final  . Calcium 04/10/2016 9.1  8.9 - 10.3 mg/dL Final  . Total Protein 04/10/2016 7.1  6.5 - 8.1 g/dL Final  . Albumin 04/10/2016 3.9  3.5 - 5.0 g/dL Final  . AST 04/10/2016 25  15 - 41 U/L Final  . ALT 04/10/2016 25  14 - 54 U/L Final  . Alkaline Phosphatase 04/10/2016 79  38 - 126 U/L Final  . Total Bilirubin 04/10/2016 0.5  0.3 - 1.2 mg/dL Final  . GFR calc non Af Amer 04/10/2016 33* >60 mL/min Final  . GFR calc Af Amer 04/10/2016 39* >60 mL/min Final   Comment: (NOTE) The eGFR has been calculated using the CKD EPI equation. This calculation has not been validated in all clinical situations. eGFR's persistently <60 mL/min signify possible Chronic Kidney Disease.   . Anion gap 04/10/2016 12  5 - 15 Final  . Glucose-Capillary 04/09/2016 184* 65 - 99 mg/dL Final  .  Glucose-Capillary 04/10/2016 221* 65 - 99 mg/dL Final  . Glucose-Capillary 04/10/2016 202* 65 - 99 mg/dL Final  . Glucose-Capillary 04/10/2016 175* 65 - 99 mg/dL Final  . Glucose-Capillary 04/10/2016 227* 65 - 99 mg/dL Final  . Glucose-Capillary 04/11/2016 152* 65 - 99 mg/dL Final  . Sodium 04/11/2016 136  135 - 145 mmol/L Final  . Potassium 04/11/2016 3.6  3.5 - 5.1 mmol/L Final  . Chloride 04/11/2016 89* 101 - 111 mmol/L Final  . CO2 04/11/2016 32  22 - 32 mmol/L Final  . Glucose, Bld 04/11/2016 172* 65 - 99 mg/dL Final  . BUN 04/11/2016 57* 6 - 20 mg/dL Final  . Creatinine, Ser 04/11/2016 1.46* 0.44 - 1.00 mg/dL Final  . Calcium 04/11/2016 9.5  8.9 - 10.3 mg/dL Final  . GFR calc non Af Amer 04/11/2016 33* >60 mL/min Final  . GFR calc Af Amer 04/11/2016 39* >60 mL/min Final   Comment: (NOTE) The eGFR has been calculated using the CKD EPI equation. This calculation has not been validated in all clinical situations. eGFR's persistently <60 mL/min signify possible Chronic Kidney Disease.   . Anion gap 04/11/2016 15  5 - 15 Final  . Glucose-Capillary 04/11/2016 184* 65 - 99 mg/dL Final  Hospital Outpatient Visit on 04/03/2016  Component Date Value Ref Range Status  . Right CCA prox sys 04/03/2016 94  cm/s Final  . Right CCA prox dias 04/03/2016 17  cm/s Final  . Right cca dist sys 04/03/2016 -76  cm/s Final  . Left CCA prox sys 04/03/2016 87  cm/s Final  . Left CCA dist sys 04/03/2016 139  cm/s Final  . Left CCA dist dias 04/03/2016 20  cm/s Final  . Left ICA prox sys 04/03/2016 -101  cm/s Final  . Left ICA prox dias 04/03/2016 -26  cm/s Final  . Left ICA dist sys 04/03/2016 -112  cm/s Final  . Left ICA dist dias 04/03/2016 -43  cm/s Final  . RIGHT CCA MID DIAS 04/03/2016 15.00  cm/s Final  . RIGHT ECA DIAS 04/03/2016 32.00  cm/s Final  . LEFT ECA DIAS 04/03/2016 -16.00  cm/s Final  Lab on 03/03/2016  Component Date Value Ref Range Status  . Magnesium 03/04/2016 1.7  1.5 - 2.5  mg/dL Final  . Sodium 03/04/2016 136  135 - 145 mEq/L Final  . Potassium 03/04/2016 4.5  3.5 - 5.1 mEq/L Final  . Chloride 03/04/2016  108  96 - 112 mEq/L Final  . CO2 03/04/2016 21  19 - 32 mEq/L Final  . Glucose, Bld 03/04/2016 101* 70 - 99 mg/dL Final  . BUN 03/04/2016 27* 6 - 23 mg/dL Final  . Creatinine, Ser 03/04/2016 1.52* 0.40 - 1.20 mg/dL Final  . Calcium 03/04/2016 9.3  8.4 - 10.5 mg/dL Final  . GFR 03/04/2016 35.14* >60.00 mL/min Final  Admission on 02/26/2016, Discharged on 02/29/2016  Component Date Value Ref Range Status  . Prothrombin Time 02/26/2016 12.6  11.4 - 15.2 seconds Final  . INR 02/26/2016 0.95   Final  . aPTT 02/26/2016 33  24 - 36 seconds Final  . WBC 02/26/2016 8.0  4.0 - 10.5 K/uL Final  . RBC 02/26/2016 4.10  3.87 - 5.11 MIL/uL Final  . Hemoglobin 02/26/2016 13.1  12.0 - 15.0 g/dL Final  . HCT 02/26/2016 39.4  36.0 - 46.0 % Final  . MCV 02/26/2016 96.1  78.0 - 100.0 fL Final  . MCH 02/26/2016 32.0  26.0 - 34.0 pg Final  . MCHC 02/26/2016 33.2  30.0 - 36.0 g/dL Final  . RDW 02/26/2016 16.0* 11.5 - 15.5 % Final  . Platelets 02/26/2016 264  150 - 400 K/uL Final  . Neutrophils Relative % 02/26/2016 63  % Final  . Neutro Abs 02/26/2016 5.1  1.7 - 7.7 K/uL Final  . Lymphocytes Relative 02/26/2016 26  % Final  . Lymphs Abs 02/26/2016 2.0  0.7 - 4.0 K/uL Final  . Monocytes Relative 02/26/2016 7  % Final  . Monocytes Absolute 02/26/2016 0.5  0.1 - 1.0 K/uL Final  . Eosinophils Relative 02/26/2016 4  % Final  . Eosinophils Absolute 02/26/2016 0.3  0.0 - 0.7 K/uL Final  . Basophils Relative 02/26/2016 0  % Final  . Basophils Absolute 02/26/2016 0.0  0.0 - 0.1 K/uL Final  . Sodium 02/26/2016 135  135 - 145 mmol/L Final  . Potassium 02/26/2016 4.6  3.5 - 5.1 mmol/L Final  . Chloride 02/26/2016 101  101 - 111 mmol/L Final  . CO2 02/26/2016 23  22 - 32 mmol/L Final  . Glucose, Bld 02/26/2016 228* 65 - 99 mg/dL Final  . BUN 02/26/2016 58* 6 - 20 mg/dL Final  .  Creatinine, Ser 02/26/2016 2.79* 0.44 - 1.00 mg/dL Final  . Calcium 02/26/2016 9.6  8.9 - 10.3 mg/dL Final  . Total Protein 02/26/2016 7.0  6.5 - 8.1 g/dL Final  . Albumin 02/26/2016 4.1  3.5 - 5.0 g/dL Final  . AST 02/26/2016 29  15 - 41 U/L Final  . ALT 02/26/2016 18  14 - 54 U/L Final  . Alkaline Phosphatase 02/26/2016 63  38 - 126 U/L Final  . Total Bilirubin 02/26/2016 0.4  0.3 - 1.2 mg/dL Final  . GFR calc non Af Amer 02/26/2016 15* >60 mL/min Final  . GFR calc Af Amer 02/26/2016 18* >60 mL/min Final   Comment: (NOTE) The eGFR has been calculated using the CKD EPI equation. This calculation has not been validated in all clinical situations. eGFR's persistently <60 mL/min signify possible Chronic Kidney Disease.   . Anion gap 02/26/2016 11  5 - 15 Final  . Troponin i, poc 02/26/2016 0.04  0.00 - 0.08 ng/mL Final  . Comment 3 02/26/2016          Final   Comment: Due to the release kinetics of cTnI, a negative result within the first hours of the onset of symptoms does not rule out myocardial infarction with  certainty. If myocardial infarction is still suspected, repeat the test at appropriate intervals.   . Glucose-Capillary 02/26/2016 242* 65 - 99 mg/dL Final  . Sodium 02/26/2016 136  135 - 145 mmol/L Final  . Potassium 02/26/2016 4.4  3.5 - 5.1 mmol/L Final  . Chloride 02/26/2016 100* 101 - 111 mmol/L Final  . BUN 02/26/2016 55* 6 - 20 mg/dL Final  . Creatinine, Ser 02/26/2016 2.80* 0.44 - 1.00 mg/dL Final  . Glucose, Bld 02/26/2016 218* 65 - 99 mg/dL Final  . Calcium, Ion 02/26/2016 1.12* 1.15 - 1.40 mmol/L Final  . TCO2 02/26/2016 25  0 - 100 mmol/L Final  . Hemoglobin 02/26/2016 14.3  12.0 - 15.0 g/dL Final  . HCT 02/26/2016 42.0  36.0 - 46.0 % Final  . Color, Urine 02/26/2016 YELLOW  YELLOW Final  . APPearance 02/26/2016 CLOUDY* CLEAR Final  . Specific Gravity, Urine 02/26/2016 1.010  1.005 - 1.030 Final  . pH 02/26/2016 5.5  5.0 - 8.0 Final  . Glucose, UA  02/26/2016 NEGATIVE  NEGATIVE mg/dL Final  . Hgb urine dipstick 02/26/2016 NEGATIVE  NEGATIVE Final  . Bilirubin Urine 02/26/2016 NEGATIVE  NEGATIVE Final  . Ketones, ur 02/26/2016 NEGATIVE  NEGATIVE mg/dL Final  . Protein, ur 02/26/2016 NEGATIVE  NEGATIVE mg/dL Final  . Nitrite 02/26/2016 NEGATIVE  NEGATIVE Final  . Leukocytes, UA 02/26/2016 NEGATIVE  NEGATIVE Final  . Specimen Description 02/27/2016 URINE, RANDOM   Final  . Special Requests 02/27/2016 NONE   Final  . Culture 02/27/2016 NO GROWTH   Final  . Report Status 02/27/2016 02/27/2016 FINAL   Final  . Hgb A1c MFr Bld 02/27/2016 6.6* 4.8 - 5.6 % Final   Comment: (NOTE)         Pre-diabetes: 5.7 - 6.4         Diabetes: >6.4         Glycemic control for adults with diabetes: <7.0   . Mean Plasma Glucose 02/27/2016 143  mg/dL Final   Comment: (NOTE) Performed At: [facility] [place], [place] [id number] Lindon Romp MD [id number]   . Weight 02/27/2016 3100.8  oz Final  . Height 02/27/2016 65  in Final  . BP 02/27/2016 92/52  mmHg Final  . Magnesium 02/26/2016 2.7* 1.7 - 2.4 mg/dL Final  . Glucose-Capillary 02/26/2016 126* 65 - 99 mg/dL Final  . Comment 1 02/26/2016 Notify RN   Final  . Comment 2 02/26/2016 Document in Chart   Final  . Sodium 02/27/2016 137  135 - 145 mmol/L Final  . Potassium 02/27/2016 5.1  3.5 - 5.1 mmol/L Final  . Chloride 02/27/2016 104  101 - 111 mmol/L Final  . CO2 02/27/2016 24  22 - 32 mmol/L Final  . Glucose, Bld 02/27/2016 98  65 - 99 mg/dL Final  . BUN 02/27/2016 47* 6 - 20 mg/dL Final  . Creatinine, Ser 02/27/2016 2.19* 0.44 - 1.00 mg/dL Final  . Calcium 02/27/2016 9.3  8.9 - 10.3 mg/dL Final  . GFR calc non Af Amer 02/27/2016 20* >60 mL/min Final  . GFR calc Af Amer 02/27/2016 24* >60 mL/min Final   Comment: (NOTE) The eGFR has been calculated using the CKD EPI equation. This calculation has not been validated in all clinical situations. eGFR's persistently  <60 mL/min signify possible Chronic Kidney Disease.   . Anion gap 02/27/2016 9  5 - 15 Final  . WBC 02/27/2016 7.9  4.0 - 10.5 K/uL Final  . RBC 02/27/2016 4.06  3.87 - 5.11 MIL/uL Final  . Hemoglobin 02/27/2016 12.9  12.0 - 15.0 g/dL Final  . HCT 02/27/2016 39.3  36.0 - 46.0 % Final  . MCV 02/27/2016 96.8  78.0 - 100.0 fL Final  . MCH 02/27/2016 31.8  26.0 - 34.0 pg Final  . MCHC 02/27/2016 32.8  30.0 - 36.0 g/dL Final  . RDW 02/27/2016 16.1* 11.5 - 15.5 % Final  . Platelets 02/27/2016 272  150 - 400 K/uL Final  . Glucose-Capillary 02/26/2016 180* 65 - 99 mg/dL Final  . Comment 1 02/26/2016 Notify RN   Final  . Comment 2 02/26/2016 Document in Chart   Final  . Glucose-Capillary 02/27/2016 124* 65 - 99 mg/dL Final  . Comment 1 02/27/2016 Notify RN   Final  . Comment 2 02/27/2016 Document in Chart   Final  . Glucose-Capillary 02/27/2016 190* 65 - 99 mg/dL Final  . Glucose-Capillary 02/27/2016 113* 65 - 99 mg/dL Final  . WBC 02/28/2016 6.8  4.0 - 10.5 K/uL Final  . RBC 02/28/2016 3.88  3.87 - 5.11 MIL/uL Final  . Hemoglobin 02/28/2016 12.3  12.0 - 15.0 g/dL Final  . HCT 02/28/2016 38.2  36.0 - 46.0 % Final  . MCV 02/28/2016 98.5  78.0 - 100.0 fL Final  . MCH 02/28/2016 31.7  26.0 - 34.0 pg Final  . MCHC 02/28/2016 32.2  30.0 - 36.0 g/dL Final  . RDW 02/28/2016 15.9* 11.5 - 15.5 % Final  . Platelets 02/28/2016 234  150 - 400 K/uL Final  . Neutrophils Relative % 02/28/2016 64  % Final  . Neutro Abs 02/28/2016 4.3  1.7 - 7.7 K/uL Final  . Lymphocytes Relative 02/28/2016 24  % Final  . Lymphs Abs 02/28/2016 1.7  0.7 - 4.0 K/uL Final  . Monocytes Relative 02/28/2016 8  % Final  . Monocytes Absolute 02/28/2016 0.5  0.1 - 1.0 K/uL Final  . Eosinophils Relative 02/28/2016 4  % Final  . Eosinophils Absolute 02/28/2016 0.3  0.0 - 0.7 K/uL Final  . Basophils Relative 02/28/2016 0  % Final  . Basophils Absolute 02/28/2016 0.0  0.0 - 0.1 K/uL Final  . Sodium 02/28/2016 141  135 - 145 mmol/L  Final  . Potassium 02/28/2016 5.5* 3.5 - 5.1 mmol/L Final  . Chloride 02/28/2016 112* 101 - 111 mmol/L Final  . CO2 02/28/2016 21* 22 - 32 mmol/L Final  . Glucose, Bld 02/28/2016 200* 65 - 99 mg/dL Final  . BUN 02/28/2016 34* 6 - 20 mg/dL Final  . Creatinine, Ser 02/28/2016 1.77* 0.44 - 1.00 mg/dL Final  . Calcium 02/28/2016 9.3  8.9 - 10.3 mg/dL Final  . GFR calc non Af Amer 02/28/2016 26* >60 mL/min Final  . GFR calc Af Amer 02/28/2016 31* >60 mL/min Final   Comment: (NOTE) The eGFR has been calculated using the CKD EPI equation. This calculation has not been validated in all clinical situations. eGFR's persistently <60 mL/min signify possible Chronic Kidney Disease.   . Anion gap 02/28/2016 8  5 - 15 Final  . Glucose-Capillary 02/27/2016 202* 65 - 99 mg/dL Final  . Comment 1 02/27/2016 Notify RN   Final  . Comment 2 02/27/2016 Document in Chart   Final  . Glucose-Capillary 02/28/2016 107* 65 - 99 mg/dL Final  . Comment 1 02/28/2016 Notify RN   Final  . Comment 2 02/28/2016 Document in Chart   Final  . Glucose-Capillary 02/28/2016 143* 65 - 99 mg/dL Final  . Comment 1 02/28/2016 Notify RN  Final  . Comment 2 02/28/2016 Document in Chart   Final  . Glucose-Capillary 02/28/2016 155* 65 - 99 mg/dL Final  . Comment 1 02/28/2016 Notify RN   Final  . Comment 2 02/28/2016 Document in Chart   Final  . WBC 02/29/2016 6.9  4.0 - 10.5 K/uL Final  . RBC 02/29/2016 3.96  3.87 - 5.11 MIL/uL Final  . Hemoglobin 02/29/2016 12.5  12.0 - 15.0 g/dL Final  . HCT 02/29/2016 38.5  36.0 - 46.0 % Final  . MCV 02/29/2016 97.2  78.0 - 100.0 fL Final  . MCH 02/29/2016 31.6  26.0 - 34.0 pg Final  . MCHC 02/29/2016 32.5  30.0 - 36.0 g/dL Final  . RDW 02/29/2016 15.7* 11.5 - 15.5 % Final  . Platelets 02/29/2016 255  150 - 400 K/uL Final  . Neutrophils Relative % 02/29/2016 61  % Final  . Neutro Abs 02/29/2016 4.2  1.7 - 7.7 K/uL Final  . Lymphocytes Relative 02/29/2016 27  % Final  . Lymphs Abs  02/29/2016 1.9  0.7 - 4.0 K/uL Final  . Monocytes Relative 02/29/2016 7  % Final  . Monocytes Absolute 02/29/2016 0.5  0.1 - 1.0 K/uL Final  . Eosinophils Relative 02/29/2016 5  % Final  . Eosinophils Absolute 02/29/2016 0.3  0.0 - 0.7 K/uL Final  . Basophils Relative 02/29/2016 0  % Final  . Basophils Absolute 02/29/2016 0.0  0.0 - 0.1 K/uL Final  . Sodium 02/29/2016 140  135 - 145 mmol/L Final  . Potassium 02/29/2016 5.6* 3.5 - 5.1 mmol/L Final  . Chloride 02/29/2016 112* 101 - 111 mmol/L Final  . CO2 02/29/2016 21* 22 - 32 mmol/L Final  . Glucose, Bld 02/29/2016 94  65 - 99 mg/dL Final  . BUN 02/29/2016 27* 6 - 20 mg/dL Final  . Creatinine, Ser 02/29/2016 1.42* 0.44 - 1.00 mg/dL Final  . Calcium 02/29/2016 9.3  8.9 - 10.3 mg/dL Final  . GFR calc non Af Amer 02/29/2016 34* >60 mL/min Final  . GFR calc Af Amer 02/29/2016 40* >60 mL/min Final   Comment: (NOTE) The eGFR has been calculated using the CKD EPI equation. This calculation has not been validated in all clinical situations. eGFR's persistently <60 mL/min signify possible Chronic Kidney Disease.   . Anion gap 02/29/2016 7  5 - 15 Final  . Glucose-Capillary 02/28/2016 114* 65 - 99 mg/dL Final  . Glucose-Capillary 02/29/2016 102* 65 - 99 mg/dL Final  . Comment 1 02/29/2016 Notify RN   Final  . Comment 2 02/29/2016 Document in Chart   Final  . Glucose-Capillary 02/29/2016 144* 65 - 99 mg/dL Final    Dg Chest 2 View  Result Date: 05/14/2016 CLINICAL DATA:  Recent falls, found on floor today, possibly hitting head, recent cough EXAM: CHEST  2 VIEW COMPARISON:  Chest x-ray of 04/09/2016 FINDINGS: There is little change in cardiomegaly, and there does appear to be mild pulmonary vascular congestion present. No focal infiltrate or effusion is seen. Pacer with AICD lead remains. The bones are osteopenic. IMPRESSION: Stable cardiomegaly.  Question mild pulmonary vascular congestion Electronically Signed   By: Ivar Drape M.D.   On:  05/14/2016 08:55   Dg Ribs Unilateral W/chest Left  Result Date: 05/14/2016 CLINICAL DATA:  Left-sided chest pain following recent fall, initial encounter EXAM: LEFT RIBS AND CHEST - 3+ VIEW COMPARISON:  05/14/2016 FINDINGS: Cardiac shadow is again enlarged. A defibrillator is again seen. Poor inspiratory effort is noted with crowding of the vascular  markings. Calcified granuloma is noted in the left lung. No acute rib fracture or pneumothorax is noted. IMPRESSION: No evidence of acute rib fracture. Electronically Signed   By: Inez Catalina M.D.   On: 05/14/2016 14:06   Ct Head Wo Contrast  Result Date: 05/14/2016 CLINICAL DATA:  Per EMS pt is from home. Son found her sitting on the floor next to her bed. Down time unknown. Pt has Hx of dementia and COPD EXAM: CT HEAD WITHOUT CONTRAST CT CERVICAL SPINE WITHOUT CONTRAST TECHNIQUE: Multidetector CT imaging of the head and cervical spine was performed following the standard protocol without intravenous contrast. Multiplanar CT image reconstructions of the cervical spine were also generated. COMPARISON:  02/26/2016 and previous FINDINGS: CT HEAD FINDINGS Brain: Diffuse parenchymal atrophy. Patchy areas of hypoattenuation in deep and periventricular white matter bilaterally. Negative for acute intracranial hemorrhage, mass lesion, acute infarction, midline shift, or mass-effect. Acute infarct may be inapparent on noncontrast CT. Ventricles and sulci symmetric. Vascular: Atherosclerotic and physiologic intracranial calcifications. Skull:  Bone windows demonstrate no focal lesion. Sinuses/Orbits: No acute finding. Other: None. CT CERVICAL SPINE FINDINGS Alignment: 2-3 mm anterolisthesis C4-5 and C5-6 probably secondary to asymmetric facet DJD. Skull base and vertebrae: No acute fracture. No primary bone lesion or focal pathologic process. Soft tissues and spinal canal: No prevertebral fluid or swelling. No visible canal hematoma. Disc levels: Central protrusion  C2-3. Mild narrowing C5-6 with right posterolateral and foraminal partially calcified protrusion resulting in foraminal stenosis. Of uncovertebral facets side DJD results in foraminal encroachment bilaterally C3-4, left C4-5, right C5-6, C6-7, and C7-T1. Upper chest: Transvenous pacing leads are partially visualized. Visualized lung apices unremarkable. Bilateral carotid calcified plaque. Other: None IMPRESSION: 1. Negative for bleed or other acute intracranial process. 2. Atrophy and nonspecific white matter changes. 3. Negative for cervical fracture or other acute bone abnormality. 4. Multilevel degenerative changes as enumerated above. Electronically Signed   By: Lucrezia Europe M.D.   On: 05/14/2016 08:46   Ct Cervical Spine Wo Contrast  Result Date: 05/14/2016 CLINICAL DATA:  Per EMS pt is from home. Son found her sitting on the floor next to her bed. Down time unknown. Pt has Hx of dementia and COPD EXAM: CT HEAD WITHOUT CONTRAST CT CERVICAL SPINE WITHOUT CONTRAST TECHNIQUE: Multidetector CT imaging of the head and cervical spine was performed following the standard protocol without intravenous contrast. Multiplanar CT image reconstructions of the cervical spine were also generated. COMPARISON:  02/26/2016 and previous FINDINGS: CT HEAD FINDINGS Brain: Diffuse parenchymal atrophy. Patchy areas of hypoattenuation in deep and periventricular white matter bilaterally. Negative for acute intracranial hemorrhage, mass lesion, acute infarction, midline shift, or mass-effect. Acute infarct may be inapparent on noncontrast CT. Ventricles and sulci symmetric. Vascular: Atherosclerotic and physiologic intracranial calcifications. Skull:  Bone windows demonstrate no focal lesion. Sinuses/Orbits: No acute finding. Other: None. CT CERVICAL SPINE FINDINGS Alignment: 2-3 mm anterolisthesis C4-5 and C5-6 probably secondary to asymmetric facet DJD. Skull base and vertebrae: No acute fracture. No primary bone lesion or focal  pathologic process. Soft tissues and spinal canal: No prevertebral fluid or swelling. No visible canal hematoma. Disc levels: Central protrusion C2-3. Mild narrowing C5-6 with right posterolateral and foraminal partially calcified protrusion resulting in foraminal stenosis. Of uncovertebral facets side DJD results in foraminal encroachment bilaterally C3-4, left C4-5, right C5-6, C6-7, and C7-T1. Upper chest: Transvenous pacing leads are partially visualized. Visualized lung apices unremarkable. Bilateral carotid calcified plaque. Other: None IMPRESSION: 1. Negative for bleed or other acute intracranial  process. 2. Atrophy and nonspecific white matter changes. 3. Negative for cervical fracture or other acute bone abnormality. 4. Multilevel degenerative changes as enumerated above. Electronically Signed   By: Lucrezia Europe M.D.   On: 05/14/2016 08:46     Assessment/Plan   ICD-9-CM ICD-10-CM   1. Other emphysema ([place]) 492.8 J43.8   2. Cardiomyopathy, ischemic 414.8 I25.5   3. CAD in native artery 414.01 I25.10   4. S/P drug eluting coronary stent placement V45.82 Z95.5    to RCA and left circumflex  5. Status post non-ST elevation myocardial infarction (NSTEMI) 412 I25.2   6. PAD (peripheral artery disease) (HCC) 443.9 I73.9   7. Essential hypertension 401.9 I10   8. Type 2 diabetes mellitus with stage 3 chronic kidney disease, with long-term current use of insulin (HCC) 250.40 E11.22    585.3 N18.3    V58.67 Z79.4   9. Mixed hyperlipidemia 272.2 E78.2   10. Biventricular implantable cardioverter-defibrillator in situ V45.02 Z95.810   11. Depression with anxiety 300.4 F41.8   12. Chronic pain syndrome 338.4 G89.4     Check CBC w diff and CMP  F/u with cardio - referral ordered to f/u NSTEMI  Will need ASA + Brilinta x 12 mos  PT/OT/ST +/- cardiac rehab as ordered  Cont current meds as ordered  Cont [place] O2 as ordered  GOAL: short term rehab and d/c home when medically appropriate.  Communicated with pt and nursing.  Will follow  Prapti Grussing S. Perlie Gold  [facility] and [facility] [address] [place], [place] [address] [phone] Cell (Monday-Friday 8 AM - 5 PM) [phone] After 5 PM and follow prompts

## 2016-05-26 NOTE — Telephone Encounter (Signed)
[facility] #[phone] Fax:[phone]  

## 2016-05-26 NOTE — Patient Outreach (Signed)
[facility]) Care Management  05/26/2016  Sydney Phillips September 19, 1935 [id number]   RN attempted the initial outreach call to pt today however unsuccessful. Will leave a HIPAA approved voice message and request a call back. Will inquire further on pt's needs at that time. Will rescheduled another outreach call this week accordingly.  Raina Mina, RN Care Management Coordinator [place] Office [phone]

## 2016-05-27 ENCOUNTER — Non-Acute Institutional Stay (SKILLED_NURSING_FACILITY): Payer: Medicare Other | Admitting: Internal Medicine

## 2016-05-27 ENCOUNTER — Other Ambulatory Visit: Payer: Self-pay | Admitting: *Deleted

## 2016-05-27 DIAGNOSIS — I255 Ischemic cardiomyopathy: Secondary | ICD-10-CM

## 2016-05-27 DIAGNOSIS — I2510 Atherosclerotic heart disease of native coronary artery without angina pectoris: Secondary | ICD-10-CM

## 2016-05-27 DIAGNOSIS — D72829 Elevated white blood cell count, unspecified: Secondary | ICD-10-CM | POA: Diagnosis not present

## 2016-05-27 DIAGNOSIS — J438 Other emphysema: Secondary | ICD-10-CM | POA: Diagnosis not present

## 2016-05-27 NOTE — Patient Outreach (Signed)
[facility]) Care Management  05/27/2016  Sydney Phillips July 30, 1935 [id number]   RN attempted second outreach call today however pt not available. Will continue attempts to reach this pt for [facility] services. Will schedule another outreach call for this week if no success will mail outreach letter and await a call back.   Raina Mina, RN Care Management Coordinator [place] Office [phone]

## 2016-05-29 ENCOUNTER — Encounter: Payer: Self-pay | Admitting: Family Medicine

## 2016-05-29 ENCOUNTER — Ambulatory Visit (INDEPENDENT_AMBULATORY_CARE_PROVIDER_SITE_OTHER): Payer: Medicare Other | Admitting: Family Medicine

## 2016-05-29 VITALS — BP 124/81 | HR 81 | Temp 97.9°F

## 2016-05-29 DIAGNOSIS — I5043 Acute on chronic combined systolic (congestive) and diastolic (congestive) heart failure: Secondary | ICD-10-CM | POA: Diagnosis not present

## 2016-05-29 DIAGNOSIS — J438 Other emphysema: Secondary | ICD-10-CM

## 2016-05-29 DIAGNOSIS — N170 Acute kidney failure with tubular necrosis: Secondary | ICD-10-CM

## 2016-05-29 DIAGNOSIS — E1122 Type 2 diabetes mellitus with diabetic chronic kidney disease: Secondary | ICD-10-CM

## 2016-05-29 DIAGNOSIS — N183 Chronic kidney disease, stage 3 unspecified: Secondary | ICD-10-CM

## 2016-05-29 DIAGNOSIS — I10 Essential (primary) hypertension: Secondary | ICD-10-CM

## 2016-05-29 DIAGNOSIS — I509 Heart failure, unspecified: Secondary | ICD-10-CM

## 2016-05-29 DIAGNOSIS — I272 Pulmonary hypertension, unspecified: Secondary | ICD-10-CM

## 2016-05-29 DIAGNOSIS — G9340 Encephalopathy, unspecified: Secondary | ICD-10-CM

## 2016-05-29 DIAGNOSIS — N179 Acute kidney failure, unspecified: Secondary | ICD-10-CM

## 2016-05-29 DIAGNOSIS — I214 Non-ST elevation (NSTEMI) myocardial infarction: Secondary | ICD-10-CM

## 2016-05-29 DIAGNOSIS — I255 Ischemic cardiomyopathy: Secondary | ICD-10-CM

## 2016-05-29 DIAGNOSIS — Z95810 Presence of automatic (implantable) cardiac defibrillator: Secondary | ICD-10-CM

## 2016-05-29 DIAGNOSIS — E118 Type 2 diabetes mellitus with unspecified complications: Secondary | ICD-10-CM

## 2016-05-29 DIAGNOSIS — Z794 Long term (current) use of insulin: Secondary | ICD-10-CM

## 2016-05-29 LAB — BASIC METABOLIC PANEL
BUN: 27 mg/dL — ABNORMAL HIGH (ref 6–23)
CHLORIDE: 100 meq/L (ref 96–112)
CO2: 31 meq/L (ref 19–32)
CREATININE: 1.48 mg/dL — AB (ref 0.40–1.20)
Calcium: 9.6 mg/dL (ref 8.4–10.5)
GFR: 36.22 mL/min — ABNORMAL LOW (ref >60.00)
GLUCOSE: 117 mg/dL — AB (ref 70–99)
Potassium: 3.6 mEq/L (ref 3.5–5.1)
Sodium: 140 mEq/L (ref 135–145)

## 2016-05-29 NOTE — Progress Notes (Signed)
Pre visit review using our clinic review tool, if applicable. No additional management support is needed unless otherwise documented below in the visit note. Pt unable to stand and weigh.   

## 2016-05-29 NOTE — Progress Notes (Signed)
   Subjective:    Patient ID: Sydney Phillips, female    DOB: December 12, 1935, 80 y.o.   MRN: [id number]  HPI Here to follow up a hospital stay from 05-14-16 to 05-23-16 for a NSTEMI which led to acute on chronic CHF, acute renal failure, and encephalopathy. She had a cardiac catheterization and several stents were placed. She had an ICD implanted. Her renal function improved and her creatinine was 1.21 with a potassium of 3.4 at the time of DC. Her diabetes was well controlled with an A1c of 6.9. Her cardiac function improved as well. She ad a leukocytosis throughout the entire stay however, and no etiology was ever found. Her WBC count at DC was 20.5. All her b;lood cultures remained negative and her urine culture was negative. She had no evidence of a respiratory infection at the time of DC. The main concern of her son (who brings her today) is her sharply declined mental function. Since her admission she has been confused, often disoriented, and sometimes delusional. She has been asking where dead relatives are and insisting that they are still alive, for instance. After her DC she was transferred to [facility] for rehab. She is getting PT and OT there. She is now totally incontinent of urine, which is a big change from her baseline.    Review of Systems  Constitutional: Negative.   HENT: Negative.   Eyes: Negative.   Respiratory: Negative.   Cardiovascular: Negative.   Gastrointestinal: Negative.   Genitourinary: Negative.   Neurological: Negative.   Psychiatric/Behavioral: Positive for confusion.       Objective:   Physical Exam  Constitutional:  In a wheelchair, alert  Neck: No thyromegaly present.  Cardiovascular: Normal rate, regular rhythm, normal heart sounds and intact distal pulses.   Pulmonary/Chest: Effort normal and breath sounds normal.  Abdominal: Soft. Bowel sounds are normal. She exhibits no distension and no mass. There is no tenderness. There is no rebound and  no guarding.  Musculoskeletal:  1+ edema in both feet   Lymphadenopathy:    She has no cervical adenopathy.  Neurological: She is alert.  She is oriented to self and knows my name, but she says the date is Jun 08, 2017.   Psychiatric: She has a normal mood and affect.          Assessment & Plan:  She is recovering from an MI which caused renal and cardiac failure. She has greatly reduced mental function from her baseline and it is possible she sustained either a stroke or ischemic brain damage during this time. She also has persistent leukocytosis with no clear etiology. She will return to the rehab facility. We will draw a CBC and a BMET today. Laurey Morale, MD

## 2016-05-30 ENCOUNTER — Emergency Department (HOSPITAL_COMMUNITY): Payer: Medicare Other

## 2016-05-30 ENCOUNTER — Encounter: Payer: Self-pay | Admitting: *Deleted

## 2016-05-30 ENCOUNTER — Encounter (HOSPITAL_COMMUNITY): Payer: Self-pay | Admitting: *Deleted

## 2016-05-30 ENCOUNTER — Telehealth: Payer: Self-pay | Admitting: Internal Medicine

## 2016-05-30 ENCOUNTER — Other Ambulatory Visit: Payer: Self-pay | Admitting: *Deleted

## 2016-05-30 ENCOUNTER — Observation Stay (HOSPITAL_COMMUNITY)
Admission: EM | Admit: 2016-05-30 | Discharge: 2016-05-31 | Disposition: A | Discharge status: Skilled Nursing Facility | Payer: Medicare Other | Attending: Family Medicine | Admitting: Family Medicine

## 2016-05-30 DIAGNOSIS — D72829 Elevated white blood cell count, unspecified: Secondary | ICD-10-CM | POA: Diagnosis not present

## 2016-05-30 DIAGNOSIS — I10 Essential (primary) hypertension: Secondary | ICD-10-CM | POA: Diagnosis present

## 2016-05-30 DIAGNOSIS — E876 Hypokalemia: Secondary | ICD-10-CM | POA: Insufficient documentation

## 2016-05-30 DIAGNOSIS — J449 Chronic obstructive pulmonary disease, unspecified: Secondary | ICD-10-CM | POA: Insufficient documentation

## 2016-05-30 DIAGNOSIS — I5042 Chronic combined systolic (congestive) and diastolic (congestive) heart failure: Secondary | ICD-10-CM | POA: Insufficient documentation

## 2016-05-30 DIAGNOSIS — G4733 Obstructive sleep apnea (adult) (pediatric): Secondary | ICD-10-CM | POA: Insufficient documentation

## 2016-05-30 DIAGNOSIS — F17210 Nicotine dependence, cigarettes, uncomplicated: Secondary | ICD-10-CM | POA: Insufficient documentation

## 2016-05-30 DIAGNOSIS — N183 Chronic kidney disease, stage 3 unspecified: Secondary | ICD-10-CM | POA: Diagnosis present

## 2016-05-30 DIAGNOSIS — Z955 Presence of coronary angioplasty implant and graft: Secondary | ICD-10-CM | POA: Insufficient documentation

## 2016-05-30 DIAGNOSIS — N189 Chronic kidney disease, unspecified: Secondary | ICD-10-CM

## 2016-05-30 DIAGNOSIS — E118 Type 2 diabetes mellitus with unspecified complications: Secondary | ICD-10-CM

## 2016-05-30 DIAGNOSIS — Z79899 Other long term (current) drug therapy: Secondary | ICD-10-CM | POA: Diagnosis not present

## 2016-05-30 DIAGNOSIS — R42 Dizziness and giddiness: Secondary | ICD-10-CM | POA: Diagnosis not present

## 2016-05-30 DIAGNOSIS — Z95810 Presence of automatic (implantable) cardiac defibrillator: Secondary | ICD-10-CM | POA: Diagnosis present

## 2016-05-30 DIAGNOSIS — F419 Anxiety disorder, unspecified: Secondary | ICD-10-CM | POA: Insufficient documentation

## 2016-05-30 DIAGNOSIS — Z950 Presence of cardiac pacemaker: Secondary | ICD-10-CM | POA: Diagnosis not present

## 2016-05-30 DIAGNOSIS — Z794 Long term (current) use of insulin: Secondary | ICD-10-CM | POA: Diagnosis not present

## 2016-05-30 DIAGNOSIS — F329 Major depressive disorder, single episode, unspecified: Secondary | ICD-10-CM | POA: Insufficient documentation

## 2016-05-30 DIAGNOSIS — E872 Acidosis, unspecified: Secondary | ICD-10-CM

## 2016-05-30 DIAGNOSIS — E1122 Type 2 diabetes mellitus with diabetic chronic kidney disease: Secondary | ICD-10-CM | POA: Insufficient documentation

## 2016-05-30 DIAGNOSIS — R7989 Other specified abnormal findings of blood chemistry: Secondary | ICD-10-CM

## 2016-05-30 DIAGNOSIS — Z7951 Long term (current) use of inhaled steroids: Secondary | ICD-10-CM | POA: Diagnosis not present

## 2016-05-30 DIAGNOSIS — E785 Hyperlipidemia, unspecified: Secondary | ICD-10-CM | POA: Diagnosis not present

## 2016-05-30 DIAGNOSIS — R410 Disorientation, unspecified: Secondary | ICD-10-CM

## 2016-05-30 DIAGNOSIS — I130 Hypertensive heart and chronic kidney disease with heart failure and stage 1 through stage 4 chronic kidney disease, or unspecified chronic kidney disease: Secondary | ICD-10-CM | POA: Diagnosis not present

## 2016-05-30 DIAGNOSIS — G9340 Encephalopathy, unspecified: Secondary | ICD-10-CM | POA: Diagnosis not present

## 2016-05-30 DIAGNOSIS — I2510 Atherosclerotic heart disease of native coronary artery without angina pectoris: Secondary | ICD-10-CM | POA: Diagnosis not present

## 2016-05-30 DIAGNOSIS — J9611 Chronic respiratory failure with hypoxia: Secondary | ICD-10-CM

## 2016-05-30 DIAGNOSIS — R740 Nonspecific elevation of levels of transaminase and lactic acid dehydrogenase [LDH]: Secondary | ICD-10-CM | POA: Diagnosis not present

## 2016-05-30 DIAGNOSIS — E119 Type 2 diabetes mellitus without complications: Secondary | ICD-10-CM

## 2016-05-30 DIAGNOSIS — I214 Non-ST elevation (NSTEMI) myocardial infarction: Secondary | ICD-10-CM | POA: Diagnosis present

## 2016-05-30 DIAGNOSIS — I252 Old myocardial infarction: Secondary | ICD-10-CM | POA: Diagnosis not present

## 2016-05-30 DIAGNOSIS — F19951 Other psychoactive substance use, unspecified with psychoactive substance-induced psychotic disorder with hallucinations: Secondary | ICD-10-CM

## 2016-05-30 DIAGNOSIS — F418 Other specified anxiety disorders: Secondary | ICD-10-CM | POA: Diagnosis present

## 2016-05-30 DIAGNOSIS — F29 Unspecified psychosis not due to a substance or known physiological condition: Secondary | ICD-10-CM

## 2016-05-30 DIAGNOSIS — E1151 Type 2 diabetes mellitus with diabetic peripheral angiopathy without gangrene: Secondary | ICD-10-CM | POA: Insufficient documentation

## 2016-05-30 LAB — CBC WITH DIFFERENTIAL/PLATELET
BASOS PCT: 0.1 % (ref 0.0–3.0)
BASOS PCT: 0 %
Basophils Absolute: 0 10*3/uL (ref 0.0–0.1)
Basophils Absolute: 0 10*3/uL (ref 0.0–0.1)
EOS ABS: 0.3 10*3/uL (ref 0.0–0.7)
Eosinophils Absolute: 0.4 10*3/uL (ref 0.0–0.7)
Eosinophils Relative: 1.9 % (ref 0.0–5.0)
Eosinophils Relative: 2 %
HEMATOCRIT: 45 % (ref 36.0–46.0)
HEMATOCRIT: 41 % (ref 36.0–46.0)
HEMOGLOBIN: 13.8 g/dL (ref 12.0–15.0)
Hemoglobin: 14.4 g/dL (ref 12.0–15.0)
Lymphocytes Relative: 7 %
Lymphs Abs: 1.1 10*3/uL (ref 0.7–4.0)
Lymphs Abs: 1.2 10*3/uL (ref 0.7–4.0)
MCH: 30.5 pg (ref 26.0–34.0)
MCHC: 32.1 g/dL (ref 30.0–36.0)
MCHC: 33.7 g/dL (ref 30.0–36.0)
MCV: 93.7 fl (ref 78.0–100.0)
MCV: 90.5 fL (ref 78.0–100.0)
MONO ABS: 1.7 10*3/uL — AB (ref 0.1–1.0)
MONOS PCT: 7.4 % (ref 3.0–12.0)
MONOS PCT: 9 %
Monocytes Absolute: 1.4 10*3/uL — ABNORMAL HIGH (ref 0.1–1.0)
NEUTROS ABS: 15.7 10*3/uL — AB (ref 1.4–7.7)
NEUTROS ABS: 15.5 10*3/uL — AB (ref 1.7–7.7)
Neutrophils Relative %: 84.4 % — ABNORMAL HIGH (ref 43.0–77.0)
Neutrophils Relative %: 82 %
PLATELETS: 272 10*3/uL (ref 150.0–400.0)
Platelets: 252 10*3/uL (ref 150–400)
RBC: 4.8 Mil/uL (ref 3.87–5.11)
RBC: 4.53 MIL/uL (ref 3.87–5.11)
RDW: 16.9 % — AB (ref 11.5–15.5)
RDW: 15.5 % (ref 11.5–15.5)
WBC: 18.6 10*3/uL (ref 4.0–10.5)
WBC: 18.8 10*3/uL — ABNORMAL HIGH (ref 4.0–10.5)

## 2016-05-30 LAB — TROPONIN I: TROPONIN I: 0.06 ng/mL — AB (ref <0.03)

## 2016-05-30 LAB — URINALYSIS, ROUTINE W REFLEX MICROSCOPIC
BACTERIA UA: NONE SEEN
Bilirubin Urine: NEGATIVE
Glucose, UA: NEGATIVE mg/dL
HGB URINE DIPSTICK: NEGATIVE
Ketones, ur: NEGATIVE mg/dL
LEUKOCYTES UA: NEGATIVE
Nitrite: NEGATIVE
PROTEIN: NEGATIVE mg/dL
SPECIFIC GRAVITY, URINE: 1.011 (ref 1.005–1.030)
pH: 5 (ref 5.0–8.0)

## 2016-05-30 LAB — COMPREHENSIVE METABOLIC PANEL
ALBUMIN: 3.9 g/dL (ref 3.5–5.0)
ALK PHOS: 80 U/L (ref 38–126)
ALT: 24 U/L (ref 14–54)
ANION GAP: 12 (ref 5–15)
AST: 30 U/L (ref 15–41)
BILIRUBIN TOTAL: 1 mg/dL (ref 0.3–1.2)
BUN: 26 mg/dL — ABNORMAL HIGH (ref 6–20)
CO2: 24 mmol/L (ref 22–32)
Calcium: 9.3 mg/dL (ref 8.9–10.3)
Chloride: 101 mmol/L (ref 101–111)
Creatinine, Ser: 1.48 mg/dL — ABNORMAL HIGH (ref 0.44–1.00)
GFR calc non Af Amer: 33 mL/min — ABNORMAL LOW (ref >60)
GFR, EST AFRICAN AMERICAN: 38 mL/min — AB (ref >60)
Glucose, Bld: 148 mg/dL — ABNORMAL HIGH (ref 65–99)
POTASSIUM: 3.3 mmol/L — AB (ref 3.5–5.1)
Sodium: 137 mmol/L (ref 135–145)
TOTAL PROTEIN: 7 g/dL (ref 6.5–8.1)

## 2016-05-30 LAB — I-STAT CG4 LACTIC ACID, ED: LACTIC ACID, VENOUS: 2.7 mmol/L — AB (ref 0.5–1.9)

## 2016-05-30 LAB — PROTIME-INR
INR: 1.06
PROTHROMBIN TIME: 13.8 s (ref 11.4–15.2)

## 2016-05-30 MED ORDER — INSULIN ASPART 100 UNIT/ML ~~LOC~~ SOLN: 0.0000 [IU] | Freq: Three times a day (TID) | SUBCUTANEOUS | Status: DC

## 2016-05-30 MED ORDER — ENOXAPARIN SODIUM 40 MG/0.4ML ~~LOC~~ SOLN: 40.0000 mg | SUBCUTANEOUS | Status: DC

## 2016-05-30 MED ORDER — ALBUTEROL SULFATE (2.5 MG/3ML) 0.083% IN NEBU: 2.5000 mg | INHALATION_SOLUTION | RESPIRATORY_TRACT | Status: DC | PRN

## 2016-05-30 MED ORDER — OLANZAPINE 10 MG IM SOLR
2.5000 mg | Freq: Once | INTRAMUSCULAR | Status: AC
  Administered 2016-05-31: 2.5 mg via INTRAMUSCULAR
  Filled 2016-05-30: qty 10

## 2016-05-30 MED ORDER — IPRATROPIUM-ALBUTEROL 0.5-2.5 (3) MG/3ML IN SOLN
3.0000 mL | Freq: Three times a day (TID) | RESPIRATORY_TRACT | Status: DC
  Administered 2016-05-31: 3 mL via RESPIRATORY_TRACT
  Filled 2016-05-30 (×2): qty 3

## 2016-05-30 MED ORDER — LORAZEPAM 2 MG/ML IJ SOLN
0.5000 mg | Freq: Once | INTRAMUSCULAR | Status: AC
  Administered 2016-05-30: 0.5 mg via INTRAVENOUS
  Filled 2016-05-30: qty 1

## 2016-05-30 MED ORDER — ALPRAZOLAM 0.5 MG PO TABS
0.5000 mg | ORAL_TABLET | Freq: Three times a day (TID) | ORAL | Status: DC | PRN
  Administered 2016-05-31: 0.5 mg via ORAL
  Filled 2016-05-30: qty 1

## 2016-05-30 MED ORDER — DM-GUAIFENESIN ER 30-600 MG PO TB12
1.0000 | ORAL_TABLET | Freq: Two times a day (BID) | ORAL | Status: DC
  Administered 2016-05-31 (×2): 1 via ORAL
  Filled 2016-05-30 (×2): qty 1

## 2016-05-30 MED ORDER — ATORVASTATIN CALCIUM 40 MG PO TABS: 40.0000 mg | ORAL_TABLET | Freq: Every day | ORAL | Status: DC

## 2016-05-30 MED ORDER — SODIUM CHLORIDE 0.9 % IV SOLN
INTRAVENOUS | Status: DC
  Administered 2016-05-31: via INTRAVENOUS

## 2016-05-30 MED ORDER — HYDROCODONE-ACETAMINOPHEN 10-325 MG PO TABS: 1.0000 | ORAL_TABLET | Freq: Four times a day (QID) | ORAL | Status: DC | PRN

## 2016-05-30 MED ORDER — POTASSIUM CHLORIDE CRYS ER 20 MEQ PO TBCR
20.0000 meq | EXTENDED_RELEASE_TABLET | Freq: Once | ORAL | Status: AC
Start: 2016-05-30 — End: 2016-05-31
  Administered 2016-05-31: 20 meq via ORAL
  Filled 2016-05-30: qty 1

## 2016-05-30 MED ORDER — TICAGRELOR 90 MG PO TABS
90.0000 mg | ORAL_TABLET | Freq: Two times a day (BID) | ORAL | Status: DC
  Administered 2016-05-31 (×2): 90 mg via ORAL
  Filled 2016-05-30 (×2): qty 1

## 2016-05-30 MED ORDER — HYDRALAZINE HCL 20 MG/ML IJ SOLN: 5.0000 mg | INTRAMUSCULAR | Status: DC | PRN

## 2016-05-30 MED ORDER — NITROGLYCERIN 0.4 MG SL SUBL: 0.4000 mg | SUBLINGUAL_TABLET | SUBLINGUAL | Status: DC | PRN

## 2016-05-30 MED ORDER — DULOXETINE HCL 30 MG PO CPEP
60.0000 mg | ORAL_CAPSULE | Freq: Two times a day (BID) | ORAL | Status: DC
  Administered 2016-05-31 (×2): 60 mg via ORAL
  Filled 2016-05-30 (×2): qty 2

## 2016-05-30 MED ORDER — MEMANTINE HCL ER 7 MG PO CP24
7.0000 mg | ORAL_CAPSULE | Freq: Every day | ORAL | Status: DC
  Administered 2016-05-31: 7 mg via ORAL
  Filled 2016-05-30: qty 1

## 2016-05-30 MED ORDER — NICOTINE 21 MG/24HR TD PT24
21.0000 mg | MEDICATED_PATCH | Freq: Every day | TRANSDERMAL | Status: DC
  Administered 2016-05-31: 21 mg via TRANSDERMAL
  Filled 2016-05-30: qty 1

## 2016-05-30 MED ORDER — LORAZEPAM 2 MG/ML IJ SOLN
0.5000 mg | INTRAMUSCULAR | Status: DC | PRN
  Administered 2016-05-31 (×2): 0.5 mg via INTRAVENOUS
  Filled 2016-05-30 (×3): qty 1

## 2016-05-30 MED ORDER — INSULIN GLARGINE 100 UNIT/ML ~~LOC~~ SOLN
7.0000 [IU] | Freq: Every day | SUBCUTANEOUS | Status: DC
  Administered 2016-05-31: 7 [IU] via SUBCUTANEOUS
  Filled 2016-05-30 (×2): qty 0.07

## 2016-05-30 MED ORDER — SODIUM CHLORIDE 0.9 % IV BOLUS (SEPSIS)
1000.0000 mL | Freq: Once | INTRAVENOUS | Status: AC
  Administered 2016-05-30: 1000 mL via INTRAVENOUS

## 2016-05-30 MED ORDER — SODIUM CHLORIDE 0.9 % IV SOLN: Freq: Once | INTRAVENOUS | Status: DC

## 2016-05-30 MED ORDER — ASPIRIN 81 MG PO CHEW
81.0000 mg | CHEWABLE_TABLET | Freq: Every day | ORAL | Status: DC
  Administered 2016-05-31: 81 mg via ORAL
  Filled 2016-05-30: qty 1

## 2016-05-30 MED ORDER — ALLOPURINOL 300 MG PO TABS
300.0000 mg | ORAL_TABLET | Freq: Every day | ORAL | Status: DC
  Administered 2016-05-31: 300 mg via ORAL
  Filled 2016-05-30: qty 1

## 2016-05-30 MED ORDER — FLUTICASONE PROPIONATE 50 MCG/ACT NA SUSP
1.0000 | Freq: Every day | NASAL | Status: DC
  Administered 2016-05-31: 1 via NASAL
  Filled 2016-05-30: qty 16

## 2016-05-30 MED ORDER — BISOPROLOL FUMARATE 5 MG PO TABS
5.0000 mg | ORAL_TABLET | Freq: Every day | ORAL | Status: DC
  Administered 2016-05-31: 5 mg via ORAL
  Filled 2016-05-30: qty 1

## 2016-05-30 MED ORDER — SODIUM CHLORIDE 0.9% FLUSH
3.0000 mL | Freq: Two times a day (BID) | INTRAVENOUS | Status: DC
  Administered 2016-05-31: 3 mL via INTRAVENOUS

## 2016-05-30 NOTE — H&P (Signed)
History and Physical    Sydney Phillips [id number] DOB: 10/08/1935 DOA: 05/30/2016  Referring MD/NP/PA:   PCP: Laurey Morale, MD   Patient coming from:  The patient is coming from SNF.  At baseline, pt is dependent for most of ADL.  Chief Complaint: AMS  HPI: Sydney Phillips is a 80 y.o. female with medical history significant of HTN, CAD, MI, s/p of recent stent, LBBB, Chronic Systolic and Diastolic CHF with EF 123456 and s/p AICD, carotid artery disease s/p left CEA, left subclavian artery stenosis s/p stent, PAD s/p right fem-pop, COPD on 2 L O2 at home, OSA, hyperlipidemia. Chronic pain syndrome, Diabetes mellitus, CKD-III, tobacco abuse, who presents with AMS.  Per EDP, pt's son states that pt becomes confused, agitated and combative today. She has active hallucination. Pt's son said that she has been confused since she had her cardiac cath, but things are worse now. She has dried blood noted in her R nare and bruising on her R hand. She yells when asked question, but denies chest pain. No active cough, nausea, vomiting, diarrhea observed. He moves all extremities. Of note, pt was recently admitted from 11/29-12/8 due to COPD exacerbation and NSTEMI.  She was sent out on levaquin and prednisone.  Pt had a cath on 12/5 which showed severe 2 vessel disease.  2 stents were placed and she is on dual antiplatelet treatment (ASA and brillinta).    ED Course: pt was found to have negative CT head for acute intracranial abnormalities, negative chest x-ray. Lactate 2.70, WBC 18.8, negative urinalysis, INR 1.06, troponin 0.06 which was 11.3 on 05/14/16, potassium 3.3, stable renal function, temperature 97.9, oxygen saturation 95% on room air, no tachycardia. Patient is admitted to telemetry bed as inpatient.  Review of Systems: could not be reviewed due to agitation and altered mental status. Allergy:  Allergies  Allergen Reactions  . Potassium-Containing Compounds Other (See Comments)   Causes severe constipation  . Azithromycin Rash  . Codeine Itching  . Darvon Itching  . Erythromycin Rash  . Meloxicam Other (See Comments)    Unknown    . Norco [Hydrocodone-Acetaminophen] Itching  . Penicillins Rash    Has patient had a PCN reaction causing immediate rash, facial/tongue/throat swelling, SOB or lightheadedness with hypotension: unknown Has patient had a PCN reaction causing severe rash involving mucus membranes or skin necrosis: no Has patient had a PCN reaction that required hospitalization: unknown Has patient had a PCN reaction occurring within the last 10 years: no If all of the above answers are "NO", then may proceed with Cephalosporin use.   Marland Kitchen Propoxyphene N-Acetaminophen Itching  . Rofecoxib Other (See Comments)    Unknown    . Rosuvastatin Other (See Comments)    cramps  . Statins Itching and Other (See Comments)    Sleeplessness  . Sulfa Antibiotics Rash    Past Medical History:  Diagnosis Date  . Arthritis   . Automatic implantable cardioverter-defibrillator in situ   . Bronchitis   . CAD (coronary artery disease)    Currently angina free, no evidence of reversible ischemia  . Chronic back pain   . Chronic constipation   . Colon polyps 2003.  2015.   HP polyps 2003.  adnomas 2015.  required referal to [facility] for colonoscopic resection of flat polyps.   . Congestive heart failure (CHF) (HCC)    New York Heart Association functional class 2, diastolic dysfunction  . COPD (chronic obstructive pulmonary disease) ([place])   . Depression  with anxiety    takes Cymbalta daily  . Diabetes mellitus without complication (HCC)    no meds  . Early cataracts, bilateral   . Fibromyalgia   . GERD (gastroesophageal reflux disease)    was on meds but was taken off;now watches what she eats  . Hemorrhoids   . History of kidney stones   . Hx of colonic polyps   . Hyperlipemia    takes Crestor daily  . Hypertension    takes Amlodipine and Metoprolol daily    . Insomnia   . LBBB (left bundle branch block)    Stress test 09/03/2010, EF 55  . Myocardial infarction   . PAD (peripheral artery disease) (HCC)    Carotid, subclavian, and lower extremity beds, currently not symptomatic  . Presence of combination internal cardiac defibrillator (ICD) and pacemaker   . Presence of permanent cardiac pacemaker   . S/P angioplasty with stent, lt. subclavian 07/31/11 08/01/2011  . Shortness of breath    when over exerting self per pt.  . Subclavian arterial stenosis, lt, with PTA/STENT 07/31/11 08/01/2011  . Syncope 07/28/2011   EF - 50-55, moderate concentric hypertrophy in left ventricle  . Urinary incontinence   . Vertigo    takes Meclizine prn    Past Surgical History:  Procedure Laterality Date  . ABDOMINAL AORTAGRAM N/A 08/12/2013   Procedure: ABDOMINAL Maxcine Ham;  Surgeon: Elam Dutch, MD;  Location: [facility] C[facility] LAB;  Service: Cardiovascular;  Laterality: N/A;  . ABDOMINAL HYSTERECTOMY    . APPENDECTOMY    . BACK SURGERY  2012  . BIV ICD GENERTAOR CHANGE OUT Left 02/20/2012   Procedure: BIV ICD GENERTAOR CHANGE OUT;  Surgeon: [name], MD;  Location: [facility] CATH LAB;  Service: Cardiovascular;  Laterality: Left;  . CARDIAC CATHETERIZATION  12/01/2007   By Dr. Melvern Banker, left heart cath,   . CARDIAC CATHETERIZATION N/A 05/20/2016   Procedure: Right/Left Heart Cath and Coronary Angiography;  Surgeon: Sherren Mocha, MD;  Location: [place] CV LAB;  Service: Cardiovascular;  Laterality: N/A;  . CARDIAC CATHETERIZATION N/A 05/20/2016   Procedure: Coronary Stent Intervention;  Surgeon: Sherren Mocha, MD;  Location: [place] CV LAB;  Service: Cardiovascular;  Laterality: N/A;  . CARDIAC DEFIBRILLATOR PLACEMENT  05/2008   By Dr Blanch Media, [employer] CANNOT HAVE MRI's  . CAROTID ANGIOGRAM N/A 07/31/2011   Procedure: CAROTID ANGIOGRAM;  Surgeon: Lorretta Harp, MD;  Location: [facility] CATH LAB;  Service: Cardiovascular;  Laterality: N/A;  carotid angiogram and  possible Lt SCA PTA  . COLONOSCOPY W/ POLYPECTOMY  12/2013  . CORONARY ANGIOPLASTY    . ENDARTERECTOMY Left 11/08/2014   Procedure: LEFT CAROTID ENDARTERECTOMY WITH HEMASHIELD PATCH ANGIOPLASTY;  Surgeon: Elam Dutch, MD;  Location: [place];  Service: Vascular;  Laterality: Left;  . ESOPHAGOGASTRODUODENOSCOPY N/A 01/20/2014   Procedure: ESOPHAGOGASTRODUODENOSCOPY (EGD);  Surgeon: Jerene Bears, MD;  Location: [facility] ENDOSCOPY;  Service: Endoscopy;  Laterality: N/A;  . FEMORAL-POPLITEAL BYPASS GRAFT Right 10/12/2013   Procedure:   Femoral-Peroneal trunk  bypass with nonreversed greater saphenous vein graft;  Surgeon: Elam Dutch, MD;  Location: [place];  Service: Vascular;  Laterality: Right;  . GIVENS CAPSULE STUDY N/A 01/20/2014   Procedure: GIVENS CAPSULE STUDY;  Surgeon: Jerene Bears, MD;  Location: [place];  Service: Gastroenterology;  Laterality: N/A;  . INTRAOPERATIVE ARTERIOGRAM Right 10/12/2013   Procedure: INTRA OPERATIVE ARTERIOGRAM;  Surgeon: Elam Dutch, MD;  Location: [employer];  Service: Vascular;  Laterality: Right;  .  ORIF ELBOW FRACTURE  08/16/2011   Procedure: OPEN REDUCTION INTERNAL FIXATION (ORIF) ELBOW/OLECRANON FRACTURE;  Surgeon: Schuyler Amor, MD;  Location: [place];  Service: Orthopedics;  Laterality: Left;  . RENAL ANGIOGRAM N/A 08/12/2013   Procedure: RENAL ANGIOGRAM;  Surgeon: Elam Dutch, MD;  Location: [facility] CATH LAB;  Service: Cardiovascular;  Laterality: N/A;  . SUBCLAVIAN STENT PLACEMENT Left 07/31/2011   7x18 Genesis, balloon, with reduction of 90% ostial left subclavian artery stenosis to 0% with residual excellent flow  . TONSILLECTOMY    . TUBAL LIGATION      Social History:  reports that she has been smoking Cigarettes.  She has a 62.00 pack-year smoking history. She has never used smokeless tobacco. She reports that she does not drink alcohol or use drugs.  Family History:  Family History  Problem Relation Age of Onset  . CAD Father   . Heart  disease Father   . Hyperlipidemia Father   . Heart disease Mother   . Deep vein thrombosis Son   . Hyperlipidemia    . Colon cancer Maternal Grandmother   . Cancer Sister     ovarian  . Diabetes Sister   . Heart disease Sister   . Anesthesia problems Neg Hx   . Hypotension Neg Hx   . Malignant hyperthermia Neg Hx   . Pseudochol deficiency Neg Hx      Prior to Admission medications   Medication Sig Start Date End Date Taking? Authorizing Provider  albuterol (PROVENTIL HFA;VENTOLIN HFA) 108 (90 Base) MCG/ACT inhaler Inhale 2 puffs into the lungs every 4 (four) hours as needed for wheezing or shortness of breath. 10/12/15  Yes Laurey Morale, MD  allopurinol (ZYLOPRIM) 300 MG tablet Take 1 tablet (300 mg total) by mouth daily. 10/15/15  Yes Laurey Morale, MD  ALPRAZolam Duanne Moron) 0.5 MG tablet Take 1 tablet (0.5 mg total) by mouth 3 (three) times daily as needed for anxiety. 05/23/16  Yes Reyne Dumas, MD  aspirin 81 MG chewable tablet Chew 1 tablet (81 mg total) by mouth daily. 05/23/16  Yes Reyne Dumas, MD  atorvastatin (LIPITOR) 40 MG tablet Take 1 tablet (40 mg total) by mouth daily at 6 PM. 05/23/16  Yes Reyne Dumas, MD  bisoprolol (ZEBETA) 5 MG tablet Take 1 tablet (5 mg total) by mouth daily. 05/23/16  Yes Reyne Dumas, MD  dextromethorphan-guaiFENesin (MUCINEX DM) 30-600 MG 12hr tablet Take 1 tablet by mouth 2 (two) times daily. 05/23/16  Yes Reyne Dumas, MD  DULoxetine (CYMBALTA) 60 MG capsule TAKE ONE CAPSULE BY MOUTH TWICE DAILY 02/04/16  Yes Laurey Morale, MD  fluticasone (CLARISPRAY) 50 MCG/ACT nasal spray Place 1 spray into both nostrils daily.   Yes Historical Provider, MD  fluticasone (FLONASE) 50 MCG/ACT nasal spray Place 1 spray into both nostrils daily as needed for allergies or rhinitis.   Yes Historical Provider, MD  HYDROcodone-acetaminophen (NORCO) 10-325 MG tablet Take one tablet by mouth every 6 hours as needed for pain. DNE 3gm of APAP from all sources/24hours 05/26/16  Yes  Lauree Chandler, NP  insulin glargine (LANTUS) 100 UNIT/ML injection Inject 10 Units into the skin at bedtime.   Yes Historical Provider, MD  ipratropium-albuterol (DUONEB) 0.5-2.5 (3) MG/3ML SOLN Take 3 mLs by nebulization 3 (three) times daily. 05/23/16  Yes Reyne Dumas, MD  levofloxacin (LEVAQUIN) 750 MG tablet Take 750 mg by mouth every other day. Prophylaxis. Starting 05/24/16.  No stop date indicated. 05/23/16  Yes Historical Provider, MD  memantine (NAMENDA XR) 7 MG CP24 24 hr capsule Take 7 mg by mouth daily.   Yes Historical Provider, MD  nitroGLYCERIN (NITROSTAT) 0.4 MG SL tablet Place 1 tablet (0.4 mg total) under the tongue every 5 (five) minutes x 3 doses as needed for chest pain. 10/12/15  Yes Laurey Morale, MD  potassium chloride SA (K-DUR,KLOR-CON) 20 MEQ tablet Take 1 tablet (20 mEq total) by mouth daily. 05/23/16  Yes Reyne Dumas, MD  ticagrelor (BRILINTA) 90 MG TABS tablet Take 1 tablet (90 mg total) by mouth 2 (two) times daily. 05/23/16  Yes Reyne Dumas, MD  torsemide (DEMADEX) 20 MG tablet Take 20 mg by mouth 2 (two) times daily.   Yes Historical Provider, MD    Physical Exam: Vitals:   05/30/16 1827 05/30/16 1829 05/30/16 2100 05/30/16 2131  BP: 104/72  109/59 108/79  Pulse:  106 93 91  Resp:   25 22  TempSrc:      SpO2:  98% 98% 99%   General: Not in acute distress. Dry mucus and membrane. HEENT:       Eyes: PERRL, EOMI, no scleral icterus.       ENT: No discharge from the ears and nose, no pharynx injection, no tonsillar enlargement.        Neck: No JVD, no bruit, no mass felt. Heme: No neck lymph node enlargement. Cardiac: S1/S2, RRR, No murmurs, No gallops or rubs. Respiratory: No rales, wheezing, rhonchi or rubs. GI: Soft, nondistended, nontender, no organomegaly, BS present. GU: No hematuria Ext: No pitting leg edema bilaterally. 2+DP/PT pulse bilaterally. Musculoskeletal: No joint deformities, No joint redness or warmth, no limitation of ROM in spin. Skin:  No rashes. Has bruiese Neuro: confused, agitated, not oriented X3, not follow command, cranial nerves II-XII grossly intact, moves all extremities. Psych: has agitation and combative  Labs on Admission: I have personally reviewed following labs and imaging studies  CBC:  Recent Labs Lab 05/29/16 1116 05/30/16 1848  WBC 18.6 cH* 18.8*  NEUTROABS 15.7* 15.5*  HGB 14.4 13.8  HCT 45.0 41.0  MCV 93.7 90.5  PLT 272.0 [id number]   Basic Metabolic Panel:  Recent Labs Lab 05/29/16 1116 05/30/16 1848  NA 140 137  K 3.6 3.3*  CL 100 101  CO2 31 24  GLUCOSE 117* 148*  BUN 27* 26*  CREATININE 1.48* 1.48*  CALCIUM 9.6 9.3   GFR: Estimated Creatinine Clearance: 32.3 mL/min (by C-G formula based on SCr of 1.48 mg/dL (H)). Liver Function Tests:  Recent Labs Lab 05/30/16 1848  AST 30  ALT 24  ALKPHOS 80  BILITOT 1.0  PROT 7.0  ALBUMIN 3.9   No results for input(s): LIPASE, AMYLASE in the last 168 hours. No results for input(s): AMMONIA in the last 168 hours. Coagulation Profile:  Recent Labs Lab 05/30/16 1848  INR 1.06   Cardiac Enzymes:  Recent Labs Lab 05/30/16 1848  TROPONINI 0.06*   BNP (last 3 results) No results for input(s): PROBNP in the last 8760 hours. HbA1C: No results for input(s): HGBA1C in the last 72 hours. CBG: No results for input(s): GLUCAP in the last 168 hours. Lipid Profile: No results for input(s): CHOL, HDL, LDLCALC, TRIG, CHOLHDL, LDLDIRECT in the last 72 hours. Thyroid Function Tests: No results for input(s): TSH, T4TOTAL, FREET4, T3FREE, THYROIDAB in the last 72 hours. Anemia Panel: No results for input(s): VITAMINB12, FOLATE, FERRITIN, TIBC, IRON, RETICCTPCT in the last 72 hours. Urine analysis:    Component Value Date/Time   COLORURINE  YELLOW 05/30/2016 2037   APPEARANCEUR CLEAR 05/30/2016 2037   LABSPEC 1.011 05/30/2016 2037   PHURINE 5.0 05/30/2016 2037   GLUCOSEU NEGATIVE 05/30/2016 2037   HGBUR NEGATIVE 05/30/2016 2037    HGBUR negative 01/01/2010 [place] 05/30/2016 2037   BILIRUBINUR n 05/30/2015 [place] 05/30/2016 2037   PROTEINUR NEGATIVE 05/30/2016 2037   UROBILINOGEN 0.2 05/30/2015 1542   UROBILINOGEN 1.0 11/03/2014 1442   NITRITE NEGATIVE 05/30/2016 2037   LEUKOCYTESUR NEGATIVE 05/30/2016 2037   Sepsis Labs: @LABRCNTIP (procalcitonin:4,lacticidven:4) )No results found for this or any previous visit (from the past 240 hour(s)).   Radiological Exams on Admission: Dg Chest 2 View  Result Date: 05/30/2016 CLINICAL DATA:  Mental status changes. EXAM: CHEST  2 VIEW COMPARISON:  05/14/2016 FINDINGS: Left AICD remains in place, unchanged. Mild cardiomegaly. No overt edema. Calcified granuloma in the left upper lobe. No confluent opacities or effusions. IMPRESSION: Cardiomegaly.  No active disease. Electronically Signed   By: Rolm Baptise M.D.   On: 05/30/2016 19:19   Ct Head Wo Contrast  Result Date: 05/30/2016 CLINICAL DATA:  Altered mental status. EXAM: CT HEAD WITHOUT CONTRAST TECHNIQUE: Contiguous axial images were obtained from the base of the skull through the vertex without intravenous contrast. COMPARISON:  05/14/2016. FINDINGS: Brain: Diffusely enlarged ventricles and subarachnoid spaces. Patchy white matter low density in both cerebral hemispheres. No intracranial hemorrhage, mass lesion or CT evidence of acute infarction. Vascular: No hyperdense vessel or unexpected calcification. Skull: Bilateral hyperostosis frontalis. Sinuses/Orbits: No acute finding. Other: None. IMPRESSION: No acute abnormality. Stable atrophy and chronic small vessel white matter ischemic changes. Electronically Signed   By: Claudie Revering M.D.   On: 05/30/2016 19:10     EKG: Independently reviewed.  Paced rhythm, QTC 541  Assessment/Plan Principal Problem:   Acute encephalopathy Active Problems:   HLD (hyperlipidemia)   Depression with anxiety   Essential hypertension   Coronary  atherosclerosis   COPD (chronic obstructive pulmonary disease) with emphysema (HCC)   Hypokalemia   Biventricular implantable cardioverter-defibrillator in situ   NSTEMI (non-ST elevated myocardial infarction) ([place])   Smoking   Controlled diabetes mellitus type 2 with complications (HCC)   Chronic combined systolic and diastolic CHF (congestive heart failure) (HCC)   CKD (chronic kidney disease), stage III   Elevated lactic acid level   Leukocytosis   Acute encephalopathy: etiology is not clear. She has leukocytosis, but no source of infection identified. Urinalysis negative and CXR is negative. No fever. Leukocytosis is likely due to recent steroid use. Patient denies chest pain. Oxygen saturation normal, less likely to have PE. Her troponin is trending down from 11.3 on 05/14/16-->0.96 today. She denies chest pain, less likely to have a new ACS. She moves all extremities, less likely to have stroke though frontal lobe stroke is on differential diagnosis. Other differential diagnosis include delirium, steroid-induced psychosis, dehydration (patient is clinically dry). CT head is negative for acute intracranial abnormalities. Patient cannot do MRI due to presents of pacemaker  -will admit to tele bed as inpt -give one dose of 2.5 mg olanzapine -prn ativan for agitation -IV fluid: 1 L normal saline, then 75 mL per hour -Frequent neuro checks  Chronic combined systolic and diastolic CHF : 2-D echo [id number] showed EF 25-30 percent with grade 2 diastolic dysfunction. Patient is on torsemide 20 mg twice a day. Patient is clinically dry. No JVD or leg edema. CHF is compensated. -Hold torsemide -Continue aspirin and Zebeta  HLD: Last LDL  was 145 on 05/15/16 -Continue home medications: Lipitor  HTN: -continue Zebeta  - IV hydralazine when necessary  CAD and recent NSTEMI: trop is trending down, troponin 0.06<--11.3 on 05/14/16. She denies chest pain. -Continue aspirin, Lipitor, Zebeta and  Brillinta -prn NTG   Depression and anxiety: Stable, no suicidal or homicidal ideations. -Continue home medications: When necessary Xanax,Cymbalta,  Hypokalemia: K= 3,3 on admission. - Repleted - Check Mg level  COPD: stable -DuoNebs and prn albuterol nebs  Tobacco abuse: -Nicotine patch  DM-II: Last A1c 6.9 on 05/15/16, well controled. Patient is taking Lantusat home -will decrease Lantus dose from 10-7 units daily -SSI  CKD-III: stable. Baseline creatinine 1.2-1.4. Her creatinine is 1.48. -Follow-up renal function by EMT  Leukocytosis: no signs of infection. Likely due to recent steroid use -follow up by CBC  Elevated lactic acid level: no signs of infection, likely due to dehydration. -IVF as above   DVT ppx: SQ Lovenox Code Status: Full code Family Communication: None at bed side.  Disposition Plan:  Anticipate discharge back to previous SNF environment Consults called:  none Admission status: Inpatient/tele    Date of Service 05/30/2016    Ivor Costa [facility] Pager [phone]  If 7PM-7AM, please contact night-coverage [email or web address] Password [facility] 05/30/2016, 10:38 PM

## 2016-05-30 NOTE — ED Provider Notes (Signed)
[place] DEPT Provider Note   CSN: [id number] Arrival date & time: 05/30/16  1801     History   Chief Complaint No chief complaint on file.   HPI Sydney Phillips is a 80 y.o. female.  Pt presents to the ED with altered mental status.  Pt was admitted to [facility] after an inpatient stay at [facility].  The pt was admitted from 11/29-12/8.  While admitted, pt was found to have a bronchitis/COPD exacerbation and NSTEMI.  She was sent out on levaquin and prednisone.  Pt had a cath on 12/5 which showed severe 2 vessel disease.  2 stents were placed and she is on dual antiplatelet treatment.  EMS was called to [place] because patient was agitated and combative.  Per EMS, pt has been like that since she arrived.  Pt is unable to give any history.  No family with her now.  Pt's son here now and said that she has been confused since she had her cardiac cath, but things are worse now.      Past Medical History:  Diagnosis Date  . Arthritis   . Automatic implantable cardioverter-defibrillator in situ   . Bronchitis   . CAD (coronary artery disease)    Currently angina free, no evidence of reversible ischemia  . Chronic back pain   . Chronic constipation   . Colon polyps 2003.  2015.   HP polyps 2003.  adnomas 2015.  required referal to [facility] for colonoscopic resection of flat polyps.   . Congestive heart failure (CHF) (HCC)    New York Heart Association functional class 2, diastolic dysfunction  . COPD (chronic obstructive pulmonary disease) ([place])   . Depression with anxiety    takes Cymbalta daily  . Diabetes mellitus without complication (HCC)    no meds  . Early cataracts, bilateral   . Fibromyalgia   . GERD (gastroesophageal reflux disease)    was on meds but was taken off;now watches what she eats  . Hemorrhoids   . History of kidney stones   . Hx of colonic polyps   . Hyperlipemia    takes Crestor daily  . Hypertension    takes Amlodipine and Metoprolol  daily  . Insomnia   . LBBB (left bundle branch block)    Stress test 09/03/2010, EF 55  . Myocardial infarction   . PAD (peripheral artery disease) (HCC)    Carotid, subclavian, and lower extremity beds, currently not symptomatic  . Presence of combination internal cardiac defibrillator (ICD) and pacemaker   . Presence of permanent cardiac pacemaker   . S/P angioplasty with stent, lt. subclavian 07/31/11 08/01/2011  . Shortness of breath    when over exerting self per pt.  . Subclavian arterial stenosis, lt, with PTA/STENT 07/31/11 08/01/2011  . Syncope 07/28/2011   EF - 50-55, moderate concentric hypertrophy in left ventricle  . Urinary incontinence   . Vertigo    takes Meclizine prn    Patient Active Problem List   Diagnosis Date Noted  . Acute encephalopathy 05/30/2016  . Chronic combined systolic and diastolic CHF (congestive heart failure) ([place]) 05/30/2016  . CKD (chronic kidney disease), stage III 05/30/2016  . Stenosis of left subclavian artery ([place]) 05/19/2016  . Fall at home   . Somnolence   . OSA and COPD overlap syndrome ([place])   . Cardiomyopathy, ischemic   . Ischemic mitral valve regurgitation   . Pulmonary hypertension   . Controlled diabetes mellitus type 2 with complications ([place])   .  Encephalopathy   . CAD in native artery   . Acute renal failure with acute tubular necrosis superimposed on stage 3 chronic kidney disease ([place])   . COPD exacerbation ([place]) 05/14/2016  . Acute on chronic combined systolic and diastolic ACC/AHA stage C congestive heart failure ([place]) 05/14/2016  . Pleural effusion   . Acute on chronic respiratory failure with hypoxia ([place])   . CHF exacerbation ([place]) 04/06/2016  . Acute respiratory distress 04/06/2016  . Altered mental state 02/26/2016  . Syncopal episodes 02/26/2016  . Acute kidney injury ([place]) 02/26/2016  . Skin lesion of back/upper right shoulder 02/26/2016  . Cardiomyopathy ([place]) 11/07/2015  . Smoking 11/07/2015  . Arthralgia  10/12/2015  . Left carotid stenosis 11/08/2014  . Diabetes mellitus ([place]) 05/02/2014  . NSTEMI (non-ST elevated myocardial infarction) ([place]) 01/20/2014  . Melena 01/20/2014  . Microcytic anemia 01/19/2014  . Chest pain 01/19/2014  . PAD (peripheral artery disease) ([place]) 10/12/2013  . S/P angioplasty with stent, lt. subclavian 07/31/11 08/01/2011  . Biventricular implantable cardioverter-defibrillator in situ 07/28/2011  . PVD, known severe, (previuosly asymptomatic) LSCA disease, now with "high grade" RICA disease. 07/28/2011  . Bronchitis, recent flare 07/28/2011  . Syncope,possible related to subclavian steal syndrome 07/27/2011  . Vertigo 07/27/2011  . Hypokalemia 07/27/2011  . SPINAL STENOSIS 01/28/2010  . CONSTIPATION 01/14/2010  . BACK PAIN, LUMBAR 01/01/2010  . INSOMNIA 01/23/2009  . ALLERGIC RHINITIS 11/10/2008  . Coronary atherosclerosis 06/21/2008  . HIP PAIN, BILATERAL 06/21/2008  . Myalgia and myositis 11/26/2007  . HLD (hyperlipidemia) 07/28/2007  . ACUTE SINUSITIS, UNSPECIFIED 07/28/2007  . WEIGHT GAIN 06/04/2007  . DYSPNEA 06/04/2007  . COPD (chronic obstructive pulmonary disease) with emphysema ([place]) 03/02/2007  . Depression with anxiety 02/25/2007  . Essential hypertension 02/25/2007  . GERD 02/25/2007  . COLONIC POLYPS, HX OF 02/25/2007    Past Surgical History:  Procedure Laterality Date  . ABDOMINAL AORTAGRAM N/A 08/12/2013   Procedure: ABDOMINAL Maxcine Ham;  Surgeon: Elam Dutch, MD;  Location: [facility] CATH LAB;  Service: Cardiovascular;  Laterality: N/A;  . ABDOMINAL HYSTERECTOMY    . APPENDECTOMY    . BACK SURGERY  2012  . BIV ICD GENERTAOR CHANGE OUT Left 02/20/2012   Procedure: BIV ICD GENERTAOR CHANGE OUT;  Surgeon: [name], MD;  Location: [facility] CATH LAB;  Service: Cardiovascular;  Laterality: Left;  . CARDIAC CATHETERIZATION  12/01/2007   By Dr. Melvern Banker, left heart cath,   . CARDIAC CATHETERIZATION N/A 05/20/2016   Procedure: Right/Left Heart Cath  and Coronary Angiography;  Surgeon: Sherren Mocha, MD;  Location: [place] CV LAB;  Service: Cardiovascular;  Laterality: N/A;  . CARDIAC CATHETERIZATION N/A 05/20/2016   Procedure: Coronary Stent Intervention;  Surgeon: Sherren Mocha, MD;  Location: [place] CV LAB;  Service: Cardiovascular;  Laterality: N/A;  . CARDIAC DEFIBRILLATOR PLACEMENT  05/2008   By Dr Blanch Media, [employer] CANNOT HAVE MRI's  . CAROTID ANGIOGRAM N/A 07/31/2011   Procedure: CAROTID ANGIOGRAM;  Surgeon: Lorretta Harp, MD;  Location: [facility] CATH LAB;  Service: Cardiovascular;  Laterality: N/A;  carotid angiogram and possible Lt SCA PTA  . COLONOSCOPY W/ POLYPECTOMY  12/2013  . CORONARY ANGIOPLASTY    . ENDARTERECTOMY Left 11/08/2014   Procedure: LEFT CAROTID ENDARTERECTOMY WITH HEMASHIELD PATCH ANGIOPLASTY;  Surgeon: Elam Dutch, MD;  Location: [place];  Service: Vascular;  Laterality: Left;  . ESOPHAGOGASTRODUODENOSCOPY N/A 01/20/2014   Procedure: ESOPHAGOGASTRODUODENOSCOPY (EGD);  Surgeon: Jerene Bears, MD;  Location: [facility] ENDOSCOPY;  Service: Endoscopy;  Laterality: N/A;  .  FEMORAL-POPLITEAL BYPASS GRAFT Right 10/12/2013   Procedure:   Femoral-Peroneal trunk  bypass with nonreversed greater saphenous vein graft;  Surgeon: Elam Dutch, MD;  Location: [place];  Service: Vascular;  Laterality: Right;  . GIVENS CAPSULE STUDY N/A 01/20/2014   Procedure: GIVENS CAPSULE STUDY;  Surgeon: Jerene Bears, MD;  Location: [place];  Service: Gastroenterology;  Laterality: N/A;  . INTRAOPERATIVE ARTERIOGRAM Right 10/12/2013   Procedure: INTRA OPERATIVE ARTERIOGRAM;  Surgeon: Elam Dutch, MD;  Location: [place];  Service: Vascular;  Laterality: Right;  . ORIF ELBOW FRACTURE  08/16/2011   Procedure: OPEN REDUCTION INTERNAL FIXATION (ORIF) ELBOW/OLECRANON FRACTURE;  Surgeon: Schuyler Amor, MD;  Location: [place];  Service: Orthopedics;  Laterality: Left;  . RENAL ANGIOGRAM N/A 08/12/2013   Procedure: RENAL ANGIOGRAM;  Surgeon:  Elam Dutch, MD;  Location: [facility] CATH LAB;  Service: Cardiovascular;  Laterality: N/A;  . SUBCLAVIAN STENT PLACEMENT Left 07/31/2011   7x18 [place], balloon, with reduction of 90% ostial left subclavian artery stenosis to 0% with residual excellent flow  . TONSILLECTOMY    . TUBAL LIGATION      OB History    No data available       Home Medications    Prior to Admission medications   Medication Sig Start Date End Date Taking? Authorizing Provider  albuterol (PROVENTIL HFA;VENTOLIN HFA) 108 (90 Base) MCG/ACT inhaler Inhale 2 puffs into the lungs every 4 (four) hours as needed for wheezing or shortness of breath. 10/12/15  Yes Laurey Morale, MD  allopurinol (ZYLOPRIM) 300 MG tablet Take 1 tablet (300 mg total) by mouth daily. 10/15/15  Yes Laurey Morale, MD  ALPRAZolam Duanne Moron) 0.5 MG tablet Take 1 tablet (0.5 mg total) by mouth 3 (three) times daily as needed for anxiety. 05/23/16  Yes Reyne Dumas, MD  aspirin 81 MG chewable tablet Chew 1 tablet (81 mg total) by mouth daily. 05/23/16  Yes Reyne Dumas, MD  atorvastatin (LIPITOR) 40 MG tablet Take 1 tablet (40 mg total) by mouth daily at 6 PM. 05/23/16  Yes Reyne Dumas, MD  bisoprolol (ZEBETA) 5 MG tablet Take 1 tablet (5 mg total) by mouth daily. 05/23/16  Yes Reyne Dumas, MD  dextromethorphan-guaiFENesin (MUCINEX DM) 30-600 MG 12hr tablet Take 1 tablet by mouth 2 (two) times daily. 05/23/16  Yes Reyne Dumas, MD  DULoxetine (CYMBALTA) 60 MG capsule TAKE ONE CAPSULE BY MOUTH TWICE DAILY 02/04/16  Yes Laurey Morale, MD  fluticasone (CLARISPRAY) 50 MCG/ACT nasal spray Place 1 spray into both nostrils daily.   Yes Historical Provider, MD  fluticasone (FLONASE) 50 MCG/ACT nasal spray Place 1 spray into both nostrils daily as needed for allergies or rhinitis.   Yes Historical Provider, MD  HYDROcodone-acetaminophen (NORCO) 10-325 MG tablet Take one tablet by mouth every 6 hours as needed for pain. DNE 3gm of APAP from all sources/24hours 05/26/16   Yes Lauree Chandler, NP  insulin glargine (LANTUS) 100 UNIT/ML injection Inject 10 Units into the skin at bedtime.   Yes Historical Provider, MD  ipratropium-albuterol (DUONEB) 0.5-2.5 (3) MG/3ML SOLN Take 3 mLs by nebulization 3 (three) times daily. 05/23/16  Yes Reyne Dumas, MD  levofloxacin (LEVAQUIN) 750 MG tablet Take 750 mg by mouth every other day. Prophylaxis. Starting 05/24/16.  No stop date indicated. 05/23/16  Yes Historical Provider, MD  memantine (NAMENDA XR) 7 MG CP24 24 hr capsule Take 7 mg by mouth daily.   Yes Historical Provider, MD  nitroGLYCERIN (NITROSTAT) 0.4  MG SL tablet Place 1 tablet (0.4 mg total) under the tongue every 5 (five) minutes x 3 doses as needed for chest pain. 10/12/15  Yes Laurey Morale, MD  potassium chloride SA (K-DUR,KLOR-CON) 20 MEQ tablet Take 1 tablet (20 mEq total) by mouth daily. 05/23/16  Yes Reyne Dumas, MD  ticagrelor (BRILINTA) 90 MG TABS tablet Take 1 tablet (90 mg total) by mouth 2 (two) times daily. 05/23/16  Yes Reyne Dumas, MD  torsemide (DEMADEX) 20 MG tablet Take 20 mg by mouth 2 (two) times daily.   Yes Historical Provider, MD    Family History Family History  Problem Relation Age of Onset  . CAD Father   . Heart disease Father   . Hyperlipidemia Father   . Heart disease Mother   . Deep vein thrombosis Son   . Hyperlipidemia    . Colon cancer Maternal Grandmother   . Cancer Sister     ovarian  . Diabetes Sister   . Heart disease Sister   . Anesthesia problems Neg Hx   . Hypotension Neg Hx   . Malignant hyperthermia Neg Hx   . Pseudochol deficiency Neg Hx     Social History Social History  Substance Use Topics  . Smoking status: Current Every Day Smoker    Packs/day: 1.00    Years: 62.00    Types: Cigarettes  . Smokeless tobacco: Never Used     Comment: Down to 3-4 cigarettes per day  . Alcohol use No     Allergies   Potassium-containing compounds; Azithromycin; Codeine; Darvon; Erythromycin; Meloxicam; Norco  [hydrocodone-acetaminophen]; Penicillins; Propoxyphene n-acetaminophen; Rofecoxib; Rosuvastatin; Statins; and Sulfa antibiotics   Review of Systems Review of Systems  Unable to perform ROS: Mental status change     Physical Exam Updated Vital Signs BP 108/79   Pulse 91   Resp 22   SpO2 99%   Physical Exam  Constitutional: She appears well-developed and well-nourished.  HENT:  Head: Normocephalic and atraumatic.  Right Ear: External ear normal.  Left Ear: External ear normal.  Nose: Nose normal.  Mouth/Throat: Oropharynx is clear and moist.  Eyes: Conjunctivae and EOM are normal. Pupils are equal, round, and reactive to light.  Neck: Normal range of motion. Neck supple.  Cardiovascular: Normal rate, regular rhythm, normal heart sounds and intact distal pulses.   Pulmonary/Chest: Effort normal and breath sounds normal.  Abdominal: Soft. Bowel sounds are normal.  Musculoskeletal: Normal range of motion.  Neurological: She is alert.  Pt won't follow commands, but she is moving all 4 extremities.    Skin: Skin is warm.  Psychiatric: Her mood appears anxious. Her affect is angry, labile and inappropriate. She is agitated, aggressive and combative. Thought content is paranoid.  Nursing note and vitals reviewed.    ED Treatments / Results  Labs (all labs ordered are listed, but only abnormal results are displayed) Labs Reviewed  CBC WITH DIFFERENTIAL/PLATELET - Abnormal; Notable for the following:       Result Value   WBC 18.8 (*)    Neutro Abs 15.5 (*)    Monocytes Absolute 1.7 (*)    All other components within normal limits  COMPREHENSIVE METABOLIC PANEL - Abnormal; Notable for the following:    Potassium 3.3 (*)    Glucose, Bld 148 (*)    BUN 26 (*)    Creatinine, Ser 1.48 (*)    GFR calc non Af Amer 33 (*)    GFR calc Af Amer 38 (*)  All other components within normal limits  URINALYSIS, ROUTINE W REFLEX MICROSCOPIC - Abnormal; Notable for the following:     Squamous Epithelial / LPF 0-5 (*)    All other components within normal limits  TROPONIN I - Abnormal; Notable for the following:    Troponin I 0.06 (*)    All other components within normal limits  I-STAT CG4 LACTIC ACID, ED - Abnormal; Notable for the following:    Lactic Acid, Venous 2.70 (*)    All other components within normal limits  PROTIME-INR    EKG  EKG Interpretation  Date/Time:  Friday May 30 2016 18:50:52 EST Ventricular Rate:  94 PR Interval:    QRS Duration: 113 QT Interval:  432 QTC Calculation: 541 R Axis:   -48 Text Interpretation:  Atrial-sensed ventricular-paced complexes No further analysis attempted due to paced rhythm Baseline wander in lead(s) II V4 Confirmed by Gilford Raid MD, Tamalyn Wadsworth ([id number]) on 05/30/2016 7:02:21 PM       Radiology Dg Chest 2 View  Result Date: 05/30/2016 CLINICAL DATA:  Mental status changes. EXAM: CHEST  2 VIEW COMPARISON:  05/14/2016 FINDINGS: Left AICD remains in place, unchanged. Mild cardiomegaly. No overt edema. Calcified granuloma in the left upper lobe. No confluent opacities or effusions. IMPRESSION: Cardiomegaly.  No active disease. Electronically Signed   By: Rolm Baptise M.D.   On: 05/30/2016 19:19   Ct Head Wo Contrast  Result Date: 05/30/2016 CLINICAL DATA:  Altered mental status. EXAM: CT HEAD WITHOUT CONTRAST TECHNIQUE: Contiguous axial images were obtained from the base of the skull through the vertex without intravenous contrast. COMPARISON:  05/14/2016. FINDINGS: Brain: Diffusely enlarged ventricles and subarachnoid spaces. Patchy white matter low density in both cerebral hemispheres. No intracranial hemorrhage, mass lesion or CT evidence of acute infarction. Vascular: No hyperdense vessel or unexpected calcification. Skull: Bilateral hyperostosis frontalis. Sinuses/Orbits: No acute finding. Other: None. IMPRESSION: No acute abnormality. Stable atrophy and chronic small vessel white matter ischemic changes.  Electronically Signed   By: Claudie Revering M.D.   On: 05/30/2016 19:10    Procedures Procedures (including critical care time)  Medications Ordered in ED Medications  sodium chloride 0.9 % bolus 1,000 mL (1,000 mLs Intravenous New Bag/Given 05/30/16 2158)     Initial Impression / Assessment and Plan / ED Course  I have reviewed the triage vital signs and the nursing notes.  Pertinent labs & imaging results that were available during my care of the patient were reviewed by me and considered in my medical decision making (see chart for details).  Clinical Course    Pt's mental status is much worse than normal according to the son.  ? From steroids?  Pt d/w Dr. Blaine Hamper for admission.  Final Clinical Impressions(s) / ED Diagnoses   Final diagnoses:  Lactic acidosis  Leukocytosis, unspecified type  Chronic renal impairment, unspecified CKD stage  Psychosis, unspecified psychosis type  Acute delirium    New Prescriptions New Prescriptions   No medications on file     Isla Pence, MD 05/30/16 2212

## 2016-05-30 NOTE — ED Notes (Signed)
Bed: [id number] Expected date:  Expected time:  Means of arrival:  Comments: EMS-combative geriatric patient

## 2016-05-30 NOTE — Patient Outreach (Signed)
[facility]) Care Management  05/30/2016  Sydney Phillips 07/28/1935 [id number]   Third outreach attempt unsuccessful. RN able to leave a HIPAA approved voice message requesting a call back. Will inquire further on possible needs at that time. Will also send outreach letter and allow pt time to respond. If no response will close case and notify pt's primary provider.  Raina Mina, RN Care Management Coordinator [place] Office [phone]

## 2016-05-30 NOTE — ED Notes (Signed)
ED Provider at bedside. 

## 2016-05-30 NOTE — ED Triage Notes (Signed)
Per EMS, pt from [facility]. Son states pt has been altered since arriving to facility 7 days ago. Staff sent patient due to combative behavior, which son states is not normal. BP 110/62, HR 72, CBG 119. Pt is on 2L [place] at facility.

## 2016-05-30 NOTE — ED Notes (Signed)
Pt is confused, appears very agitated.  Yells whenever she talks.  She is looking for her children "so I can put them to bed."  Pt hyperventilates at times, was asking where her lighter was.  Pt is sometimes combative, which is new per her son.  Confusion is her norm since she was transferred to the nsg facility.  Dried blood noted in her R nare.  Bruising noted on her R hand.

## 2016-05-30 NOTE — Telephone Encounter (Signed)
S/w son, Gabriel Carina (listed on emergency contact list), by phone. He is c/a pt's rapid decline in mental status since her cardiac cath. She was seen by her PCP on yesterday who also shared concern for possible stroke following her cardiac and renal issues during hospital admission. CT head on 05/15/16 neg for acute process but this was prior to her cardiac cath procedure.  Informed son that psych services has also evaluated pt and started her on namenda. He insists on a neurologic w/u. Called [facility] and gave verbal order to supervisor Danae Chen for stat CT head without contrast to r/o stroke AND Neurology referral 1st available appt.

## 2016-05-31 DIAGNOSIS — J9611 Chronic respiratory failure with hypoxia: Secondary | ICD-10-CM | POA: Diagnosis not present

## 2016-05-31 DIAGNOSIS — N183 Chronic kidney disease, stage 3 (moderate): Secondary | ICD-10-CM | POA: Diagnosis not present

## 2016-05-31 DIAGNOSIS — F19951 Other psychoactive substance use, unspecified with psychoactive substance-induced psychotic disorder with hallucinations: Secondary | ICD-10-CM

## 2016-05-31 DIAGNOSIS — I5042 Chronic combined systolic (congestive) and diastolic (congestive) heart failure: Secondary | ICD-10-CM

## 2016-05-31 LAB — CBC
HCT: 36.3 % (ref 36.0–46.0)
Hemoglobin: 11.8 g/dL — ABNORMAL LOW (ref 12.0–15.0)
MCH: 30.5 pg (ref 26.0–34.0)
MCHC: 32.5 g/dL (ref 30.0–36.0)
MCV: 93.8 fL (ref 78.0–100.0)
PLATELETS: 207 10*3/uL (ref 150–400)
RBC: 3.87 MIL/uL (ref 3.87–5.11)
RDW: 15.8 % — AB (ref 11.5–15.5)
WBC: 12.9 10*3/uL — AB (ref 4.0–10.5)

## 2016-05-31 LAB — LACTIC ACID, PLASMA
LACTIC ACID, VENOUS: 1.4 mmol/L (ref 0.5–1.9)
LACTIC ACID, VENOUS: 1.4 mmol/L (ref 0.5–1.9)

## 2016-05-31 LAB — BASIC METABOLIC PANEL
Anion gap: 8 (ref 5–15)
BUN: 19 mg/dL (ref 6–20)
CALCIUM: 8.8 mg/dL — AB (ref 8.9–10.3)
CHLORIDE: 110 mmol/L (ref 101–111)
CO2: 25 mmol/L (ref 22–32)
CREATININE: 1.16 mg/dL — AB (ref 0.44–1.00)
GFR calc Af Amer: 51 mL/min — ABNORMAL LOW (ref >60)
GFR, EST NON AFRICAN AMERICAN: 44 mL/min — AB (ref >60)
Glucose, Bld: 77 mg/dL (ref 65–99)
Potassium: 3.8 mmol/L (ref 3.5–5.1)
SODIUM: 143 mmol/L (ref 135–145)

## 2016-05-31 LAB — GLUCOSE, CAPILLARY
GLUCOSE-CAPILLARY: 114 mg/dL — AB (ref 65–99)
Glucose-Capillary: 76 mg/dL (ref 65–99)
Glucose-Capillary: 86 mg/dL (ref 65–99)

## 2016-05-31 LAB — MRSA PCR SCREENING: MRSA BY PCR: NEGATIVE

## 2016-05-31 LAB — MAGNESIUM: Magnesium: 1.9 mg/dL (ref 1.7–2.4)

## 2016-05-31 LAB — TROPONIN I
TROPONIN I: 0.06 ng/mL — AB (ref <0.03)
TROPONIN I: 0.07 ng/mL — AB (ref <0.03)
Troponin I: 0.06 ng/mL (ref <0.03)

## 2016-05-31 LAB — BRAIN NATRIURETIC PEPTIDE: B Natriuretic Peptide: 202.1 pg/mL — ABNORMAL HIGH (ref 0.0–100.0)

## 2016-05-31 MED ORDER — ORAL CARE MOUTH RINSE: 15.0000 mL | Freq: Two times a day (BID) | OROMUCOSAL | Status: DC

## 2016-05-31 MED ORDER — HYDROCODONE-ACETAMINOPHEN 10-325 MG PO TABS: ORAL_TABLET | ORAL | 0 refills | Status: DC

## 2016-05-31 NOTE — Progress Notes (Signed)
CRITICAL VALUE ALERT  Critical value received:  Troponin 0.07  Date of notification:  05/31/16  Time of notification:  1412  Critical value read back: Yes  Nurse who received alert:  Laurance Flatten  MD notified (1st page):  Dr. Bonner Puna  Time of first page:  1413  MD notified (2nd page):  Time of second page:  Responding MD:  Dr Bonner Puna  Time MD responded:  1420

## 2016-05-31 NOTE — Clinical Social Work Note (Signed)
Patient admitted from SNF, [facility]. Family agreeable to patient returning to SNF. Facility notified and prepared to re-admit patient today, 12/16.  Patient asleep and disoriented.   No further concerns reported at this time.   Medical Social Worker facilitated patient discharge including contacting patient family and facility to confirm patient discharge plans.  Clinical information faxed to facility and family agreeable with plan.  MSW arranged ambulance transport via [place] to [facility].  RN to call report prior to discharge [phone].  Medical Social Worker will sign off for now as social work intervention is no longer needed. Please consult [place] again if new need arises.  Glendon Axe, MSW [phone] 05/31/2016 12:58 PM

## 2016-05-31 NOTE — Discharge Summary (Signed)
Physician Discharge Summary  Sydney Phillips [id number] DOB: 1936-06-07 DOA: 05/30/2016  PCP: Laurey Morale, MD  Admit date: 05/30/2016 Discharge date: 05/31/2016  Admitted From: SNF Disposition: SNF   Recommendations for Outpatient Follow-up:  1. Follow up withneurology in 1-2 weeks. 2. Hold steroids - suspicion for AMS caused by steroid psychosis, resolving with D/C of steroids.  3. Monitor weight/hydration status. Slightly dehydrated on admission, improved with gentle IVF. Euvolemic on exam at discharge, will restart diuretic therapy.  4. Please obtain BMP/CBC in one week to monitor renal function and leukocytosis (previously elevated due to steroids, trending down to 12.9 12/16.   Home Health: None Equipment/Devices: Continue 2L O2 by nasal cannula Discharge Condition: Stable CODE STATUS: Full Diet recommendation: Heart healthy, carbohydrate-modified  Brief/Interim Summary: Sydney Phillips is a 80 y.o. female with a history of chronic respiratory failure from COPD, OSA, CAD s/p PCI on 12/5, LBBB s/p AICD, chronic combined CHF (EF 25%), carotid stenosis s/p left CEA, left subclavian artery stenosis s/p stent, chronic pain syndrome, DM, HTN, stage III CKD, and tobacco use sent from SNF for agitation/hallucinations.   She was recently admitted at [facility] 11/29 - 12/8 for COPD exacerbation found to have NSTEMI, had cath with PCI x2 12/5. She was treated with DAPT and continued steroids and levaquin at discharge. Her son reports a change from mental baseline since catheterization that has waxed and waned for which he sought care from her long-time PCP who recommended outpatient neurology follow up. She has continued to have good days and bad days, but became agitated and uncooperative, hallucinating seeing her dead husband, talking to people not in the room. On admission she appeared chronically ill and uncooperative with agitation. She was afebrile with HR 102, 98% on chronic 2L O2, with  findings of right hand bruising, and scant dried blood in right nare, appearing volume down. Work up for AMS included CT head, negative for acute intracranial abnormalities. No infiltrate or edema on CXR. Urinalysis was completely negative. Lactate was 2.70, WBC 18.8, troponin 0.06, down from 11.3 on 05/14/16, potassium 3.3, SCr 1.48. She was given IVF, single dose of olanzapine 2.5mg , and admitted for work up. Predominant differential diagnosis included dehydration, steroid psychosis, and occult frontal CVA. MRI is precluded due to AICD. Overnight lactate cleared to 1.4, troponin stayed stable at 0.06 x3. WBC trended down to 12.9, and creatinine improved to 1.16, below previous discharge Cr.   Mental status improved the following morning. She is not agitated, and does not seem to be experiencing hallucinations. She remains disoriented, though this has been near baseline with waxing/waning delirium per my discussion with her son. Delirium is likely to be ongoing due to chronic and recently worsening morbidity, chronic pain and sedating medications. Steroids will be discontinued as this likely contributed to acute worsening with psychotic features. Consider adding olanzapine to medications following discharge.   Discharge Diagnoses:  Principal Problem:   Acute encephalopathy Active Problems:   HLD (hyperlipidemia)   Depression with anxiety   Essential hypertension   Coronary atherosclerosis   COPD (chronic obstructive pulmonary disease) with emphysema (HCC)   Hypokalemia   Biventricular implantable cardioverter-defibrillator in situ   NSTEMI (non-ST elevated myocardial infarction) ([place])   Smoking   Controlled diabetes mellitus type 2 with complications (HCC)   Chronic combined systolic and diastolic CHF (congestive heart failure) (HCC)   CKD (chronic kidney disease), stage III   Elevated lactic acid level   Leukocytosis  Acute encephalopathy: etiology is not clear. She  has leukocytosis, but no  source of infection identified. Urinalysis negative and CXR is negative. No fever. Leukocytosis is likely due to recent steroid use. Patient denies chest pain. Oxygen saturation normal, less likely to have PE or new ACS. Her troponin is trending down from 11.3 on 05/14/16-->0.06 today. She moves all extremities, less likely to have stroke though frontal lobe stroke is on differential diagnosis. Other differential diagnosis include delirium, steroid-induced psychosis, dehydration (patient is clinically dry). CT head is negative for acute intracranial abnormalities. Patient cannot do MRI due to presents of pacemaker - No overnight events, mental status improved.  - Continue namenda - Gave one dose of 2.5 mg olanzapine, consider continuing this if needed (not prescribed at discharge) - Strongly recommend neurology follow up outside scope of this acute episode.  Chronic combined systolic and diastolic CHF : 2-D echo [id number] showed EF 25-30 percent with grade 2 diastolic dysfunction. Patient is on torsemide 20 mg twice a day. Patient is clinically dry. No JVD or leg edema following IVF's. CHF is compensated. - Ok to restart torsemide if she continues to take reliable po. -Continue aspirin and Zebeta  HLD: Last LDL was 145 on 05/15/16 -Continue home medications: Lipitor  HTN: -continue Zebeta   CAD and recent NSTEMI: trop is trending down, troponin 0.06<--11.3 on 05/14/16. She denies chest pain. -Continue aspirin, Lipitor, Zebeta and Brillinta -prn NTG   Depression and anxiety: Stable, no suicidal or homicidal ideations. -Continue home medications: When necessary Xanax,Cymbalta,  Hypokalemia: K= 3,3 on admission. - Repleted to 3.8, Mg 1.9. Continue potassium supplementation with torsemide.  COPD: stable -DuoNebs and prn albuterol nebs  Tobacco abuse: -Nicotine patch  DM-II: Last A1c 6.9 on 05/15/16, well controled.  CKD-III: stable. Baseline creatinine 1.2-1.4. Creatinine 1.48  on admission > 1.16 at discharge.   Leukocytosis: no signs of infection. Likely due to recent steroid use, improved. -follow up by CBC  Elevated lactic acid level: no signs of infection, likely due to dehydration, resolved with IVF  Discharge Instructions Discharge Instructions    ([place]) Call MD:  Anytime you have any of the following symptoms: 1) 3 pound weight gain in 24 hours or 5 pounds in 1 week 2) shortness of breath, with or without a dry hacking cough 3) swelling in the hands, feet or stomach 4) if you have to sleep on extra pillows at night in order to breathe.    Complete by:  As directed    Call MD for:  difficulty breathing, headache or visual disturbances    Complete by:  As directed    Call MD for:  persistant nausea and vomiting    Complete by:  As directed    Diet - low sodium heart healthy    Complete by:  As directed      Allergies as of 05/31/2016      Reactions   Potassium-containing Compounds Other (See Comments)   Causes severe constipation   Azithromycin Rash   Codeine Itching   Darvon Itching   Erythromycin Rash   Meloxicam Other (See Comments)   Unknown    Norco [hydrocodone-acetaminophen] Itching   Penicillins Rash   Has patient had a PCN reaction causing immediate rash, facial/tongue/throat swelling, SOB or lightheadedness with hypotension: unknown Has patient had a PCN reaction causing severe rash involving mucus membranes or skin necrosis: no Has patient had a PCN reaction that required hospitalization: unknown Has patient had a PCN reaction occurring within the last 10 years: no If all of  the above answers are "NO", then may proceed with Cephalosporin use.   Propoxyphene N-acetaminophen Itching   Rofecoxib Other (See Comments)   Unknown    Rosuvastatin Other (See Comments)   cramps   Statins Itching, Other (See Comments)   Sleeplessness   Sulfa Antibiotics Rash      Medication List    TAKE these medications   albuterol  108 (90 Base) MCG/ACT inhaler Commonly known as:  PROVENTIL HFA;VENTOLIN HFA Inhale 2 puffs into the lungs every 4 (four) hours as needed for wheezing or shortness of breath.   allopurinol 300 MG tablet Commonly known as:  ZYLOPRIM Take 1 tablet (300 mg total) by mouth daily.   ALPRAZolam 0.5 MG tablet Commonly known as:  XANAX Take 1 tablet (0.5 mg total) by mouth 3 (three) times daily as needed for anxiety.   aspirin 81 MG chewable tablet Chew 1 tablet (81 mg total) by mouth daily.   atorvastatin 40 MG tablet Commonly known as:  LIPITOR Take 1 tablet (40 mg total) by mouth daily at 6 PM.   bisoprolol 5 MG tablet Commonly known as:  ZEBETA Take 1 tablet (5 mg total) by mouth daily.   dextromethorphan-guaiFENesin 30-600 MG 12hr tablet Commonly known as:  MUCINEX DM Take 1 tablet by mouth 2 (two) times daily.   DULoxetine 60 MG capsule Commonly known as:  CYMBALTA TAKE ONE CAPSULE BY MOUTH TWICE DAILY   fluticasone 50 MCG/ACT nasal spray Commonly known as:  FLONASE Place 1 spray into both nostrils daily as needed for allergies or rhinitis.   CLARISPRAY 50 MCG/ACT nasal spray Generic drug:  fluticasone Place 1 spray into both nostrils daily.   HYDROcodone-acetaminophen 10-325 MG tablet Commonly known as:  NORCO Take one tablet by mouth every 6 hours as needed for pain. DNE 3gm of APAP from all sources/24hours   insulin glargine 100 UNIT/ML injection Commonly known as:  LANTUS Inject 10 Units into the skin at bedtime.   ipratropium-albuterol 0.5-2.5 (3) MG/3ML Soln Commonly known as:  DUONEB Take 3 mLs by nebulization 3 (three) times daily.   levofloxacin 750 MG tablet Commonly known as:  LEVAQUIN Take 750 mg by mouth every other day. Prophylaxis. Starting 05/24/16.  No stop date indicated.   memantine 7 MG Cp24 24 hr capsule Commonly known as:  NAMENDA XR Take 7 mg by mouth daily.   nitroGLYCERIN 0.4 MG SL tablet Commonly known as:  NITROSTAT Place 1 tablet  (0.4 mg total) under the tongue every 5 (five) minutes x 3 doses as needed for chest pain.   potassium chloride SA 20 MEQ tablet Commonly known as:  K-DUR,KLOR-CON Take 1 tablet (20 mEq total) by mouth daily.   ticagrelor 90 MG Tabs tablet Commonly known as:  BRILINTA Take 1 tablet (90 mg total) by mouth 2 (two) times daily.   torsemide 20 MG tablet Commonly known as:  DEMADEX Take 20 mg by mouth 2 (two) times daily.       Allergies  Allergen Reactions  . Potassium-Containing Compounds Other (See Comments)    Causes severe constipation  . Azithromycin Rash  . Codeine Itching  . Darvon Itching  . Erythromycin Rash  . Meloxicam Other (See Comments)    Unknown    . Norco [Hydrocodone-Acetaminophen] Itching  . Penicillins Rash    Has patient had a PCN reaction causing immediate rash, facial/tongue/throat swelling, SOB or lightheadedness with hypotension: unknown Has patient had a PCN reaction causing severe rash involving mucus membranes or skin necrosis:  no Has patient had a PCN reaction that required hospitalization: unknown Has patient had a PCN reaction occurring within the last 10 years: no If all of the above answers are "NO", then may proceed with Cephalosporin use.   Marland Kitchen Propoxyphene N-Acetaminophen Itching  . Rofecoxib Other (See Comments)    Unknown    . Rosuvastatin Other (See Comments)    cramps  . Statins Itching and Other (See Comments)    Sleeplessness  . Sulfa Antibiotics Rash    Consultations: None  Procedures/Studies: Dg Chest 2 View  Result Date: 05/30/2016 CLINICAL DATA:  Mental status changes. EXAM: CHEST  2 VIEW COMPARISON:  05/14/2016 FINDINGS: Left AICD remains in place, unchanged. Mild cardiomegaly. No overt edema. Calcified granuloma in the left upper lobe. No confluent opacities or effusions. IMPRESSION: Cardiomegaly.  No active disease. Electronically Signed   By: Rolm Baptise M.D.   On: 05/30/2016 19:19   Dg Chest 2 View  Result Date:  05/14/2016 CLINICAL DATA:  Recent falls, found on floor today, possibly hitting head, recent cough EXAM: CHEST  2 VIEW COMPARISON:  Chest x-ray of 04/09/2016 FINDINGS: There is little change in cardiomegaly, and there does appear to be mild pulmonary vascular congestion present. No focal infiltrate or effusion is seen. Pacer with AICD lead remains. The bones are osteopenic. IMPRESSION: Stable cardiomegaly.  Question mild pulmonary vascular congestion Electronically Signed   By: Ivar Drape M.D.   On: 05/14/2016 08:55   Dg Ribs Unilateral W/chest Left  Result Date: 05/14/2016 CLINICAL DATA:  Left-sided chest pain following recent fall, initial encounter EXAM: LEFT RIBS AND CHEST - 3+ VIEW COMPARISON:  05/14/2016 FINDINGS: Cardiac shadow is again enlarged. A defibrillator is again seen. Poor inspiratory effort is noted with crowding of the vascular markings. Calcified granuloma is noted in the left lung. No acute rib fracture or pneumothorax is noted. IMPRESSION: No evidence of acute rib fracture. Electronically Signed   By: Inez Catalina M.D.   On: 05/14/2016 14:06   Ct Head Wo Contrast  Result Date: 05/30/2016 CLINICAL DATA:  Altered mental status. EXAM: CT HEAD WITHOUT CONTRAST TECHNIQUE: Contiguous axial images were obtained from the base of the skull through the vertex without intravenous contrast. COMPARISON:  05/14/2016. FINDINGS: Brain: Diffusely enlarged ventricles and subarachnoid spaces. Patchy white matter low density in both cerebral hemispheres. No intracranial hemorrhage, mass lesion or CT evidence of acute infarction. Vascular: No hyperdense vessel or unexpected calcification. Skull: Bilateral hyperostosis frontalis. Sinuses/Orbits: No acute finding. Other: None. IMPRESSION: No acute abnormality. Stable atrophy and chronic small vessel white matter ischemic changes. Electronically Signed   By: Claudie Revering M.D.   On: 05/30/2016 19:10   Ct Head Wo Contrast  Result Date:  05/14/2016 CLINICAL DATA:  Per EMS pt is from home. Son found her sitting on the floor next to her bed. Down time unknown. Pt has Hx of dementia and COPD EXAM: CT HEAD WITHOUT CONTRAST CT CERVICAL SPINE WITHOUT CONTRAST TECHNIQUE: Multidetector CT imaging of the head and cervical spine was performed following the standard protocol without intravenous contrast. Multiplanar CT image reconstructions of the cervical spine were also generated. COMPARISON:  02/26/2016 and previous FINDINGS: CT HEAD FINDINGS Brain: Diffuse parenchymal atrophy. Patchy areas of hypoattenuation in deep and periventricular white matter bilaterally. Negative for acute intracranial hemorrhage, mass lesion, acute infarction, midline shift, or mass-effect. Acute infarct may be inapparent on noncontrast CT. Ventricles and sulci symmetric. Vascular: Atherosclerotic and physiologic intracranial calcifications. Skull:  Bone windows demonstrate no focal lesion.  Sinuses/Orbits: No acute finding. Other: None. CT CERVICAL SPINE FINDINGS Alignment: 2-3 mm anterolisthesis C4-5 and C5-6 probably secondary to asymmetric facet DJD. Skull base and vertebrae: No acute fracture. No primary bone lesion or focal pathologic process. Soft tissues and spinal canal: No prevertebral fluid or swelling. No visible canal hematoma. Disc levels: Central protrusion C2-3. Mild narrowing C5-6 with right posterolateral and foraminal partially calcified protrusion resulting in foraminal stenosis. Of uncovertebral facets side DJD results in foraminal encroachment bilaterally C3-4, left C4-5, right C5-6, C6-7, and C7-T1. Upper chest: Transvenous pacing leads are partially visualized. Visualized lung apices unremarkable. Bilateral carotid calcified plaque. Other: None IMPRESSION: 1. Negative for bleed or other acute intracranial process. 2. Atrophy and nonspecific white matter changes. 3. Negative for cervical fracture or other acute bone abnormality. 4. Multilevel degenerative  changes as enumerated above. Electronically Signed   By: Lucrezia Europe M.D.   On: 05/14/2016 08:46   Ct Cervical Spine Wo Contrast  Result Date: 05/14/2016 CLINICAL DATA:  Per EMS pt is from home. Son found her sitting on the floor next to her bed. Down time unknown. Pt has Hx of dementia and COPD EXAM: CT HEAD WITHOUT CONTRAST CT CERVICAL SPINE WITHOUT CONTRAST TECHNIQUE: Multidetector CT imaging of the head and cervical spine was performed following the standard protocol without intravenous contrast. Multiplanar CT image reconstructions of the cervical spine were also generated. COMPARISON:  02/26/2016 and previous FINDINGS: CT HEAD FINDINGS Brain: Diffuse parenchymal atrophy. Patchy areas of hypoattenuation in deep and periventricular white matter bilaterally. Negative for acute intracranial hemorrhage, mass lesion, acute infarction, midline shift, or mass-effect. Acute infarct may be inapparent on noncontrast CT. Ventricles and sulci symmetric. Vascular: Atherosclerotic and physiologic intracranial calcifications. Skull:  Bone windows demonstrate no focal lesion. Sinuses/Orbits: No acute finding. Other: None. CT CERVICAL SPINE FINDINGS Alignment: 2-3 mm anterolisthesis C4-5 and C5-6 probably secondary to asymmetric facet DJD. Skull base and vertebrae: No acute fracture. No primary bone lesion or focal pathologic process. Soft tissues and spinal canal: No prevertebral fluid or swelling. No visible canal hematoma. Disc levels: Central protrusion C2-3. Mild narrowing C5-6 with right posterolateral and foraminal partially calcified protrusion resulting in foraminal stenosis. Of uncovertebral facets side DJD results in foraminal encroachment bilaterally C3-4, left C4-5, right C5-6, C6-7, and C7-T1. Upper chest: Transvenous pacing leads are partially visualized. Visualized lung apices unremarkable. Bilateral carotid calcified plaque. Other: None IMPRESSION: 1. Negative for bleed or other acute intracranial process.  2. Atrophy and nonspecific white matter changes. 3. Negative for cervical fracture or other acute bone abnormality. 4. Multilevel degenerative changes as enumerated above. Electronically Signed   By: Lucrezia Europe M.D.   On: 05/14/2016 08:46    Subjective: Pt without acute events overnight, sleeping quietly this morning. Drowsy but responsive. Denies pain or chest pain or dyspnea.   Discharge Exam: Vitals:   05/30/16 2350 05/31/16 0652  BP: 132/70   Pulse: (!) 102 92  Resp: 20 20  Temp: 97.6 F (36.4 C) 97.6 F (36.4 C)   Vitals:   05/30/16 2100 05/30/16 2131 05/30/16 2350 05/31/16 0652  BP: 109/59 108/79 132/70   Pulse: 93 91 (!) 102 92  Resp: 25 22 20 20   Temp:   97.6 F (36.4 C) 97.6 F (36.4 C)  TempSrc:   Oral Axillary  SpO2: 98% 99% 93% 98%  Weight:   74.3 kg (163 lb 12.8 oz)   Height:   5\' 5"  (1.651 m)    General: Pt is drowsy but rousable, not  in acute distress Cardiovascular: RRR, S1/S2 +, no murmurs, no rubs, no gallops Respiratory: CTA bilaterally, no wheezing, no rhonchi Abdominal: Soft, NT, ND, bowel sounds + Extremities: no edema, no cyanosis Neuro: No aphasia, moving all extremities  The results of significant diagnostics from this hospitalization (including imaging, microbiology, ancillary and laboratory) are listed below for reference.    Microbiology: Recent Results (from the past 240 hour(s))  MRSA PCR Screening     Status: None   Collection Time: 05/31/16 12:26 AM  Result Value Ref Range Status   MRSA by PCR NEGATIVE NEGATIVE Final    Comment:        The GeneXpert MRSA Assay (FDA approved for NASAL specimens only), is one component of a comprehensive MRSA colonization surveillance program. It is not intended to diagnose MRSA infection nor to guide or monitor treatment for MRSA infections.      Labs: BNP (last 3 results)  Recent Labs  04/06/16 1205 05/31/16 0002  BNP 308.6* 0000000*   Basic Metabolic Panel:  Recent Labs Lab  05/29/16 1116 05/30/16 1848 05/31/16 0652  NA 140 137 143  K 3.6 3.3* 3.8  CL 100 101 110  CO2 31 24 25   GLUCOSE 117* 148* 77  BUN 27* 26* 19  CREATININE 1.48* 1.48* 1.16*  CALCIUM 9.6 9.3 8.8*  MG  --   --  1.9   Liver Function Tests:  Recent Labs Lab 05/30/16 1848  AST 30  ALT 24  ALKPHOS 80  BILITOT 1.0  PROT 7.0  ALBUMIN 3.9   No results for input(s): LIPASE, AMYLASE in the last 168 hours. No results for input(s): AMMONIA in the last 168 hours. CBC:  Recent Labs Lab 05/29/16 1116 05/30/16 1848 05/31/16 0652  WBC 18.6 cH* 18.8* 12.9*  NEUTROABS 15.7* 15.5*  --   HGB 14.4 13.8 11.8*  HCT 45.0 41.0 36.3  MCV 93.7 90.5 93.8  PLT 272.0 252 207   Cardiac Enzymes:  Recent Labs Lab 05/30/16 1848 05/31/16 0002 05/31/16 0652  TROPONINI 0.06* 0.06* 0.06*   BNP: Invalid input(s): POCBNP CBG:  Recent Labs Lab 05/31/16 0028 05/31/16 0921  GLUCAP 114* 76   D-Dimer No results for input(s): DDIMER in the last 72 hours. Hgb A1c No results for input(s): HGBA1C in the last 72 hours. Lipid Profile No results for input(s): CHOL, HDL, LDLCALC, TRIG, CHOLHDL, LDLDIRECT in the last 72 hours. Thyroid function studies No results for input(s): TSH, T4TOTAL, T3FREE, THYROIDAB in the last 72 hours.  Invalid input(s): FREET3 Anemia work up No results for input(s): VITAMINB12, FOLATE, FERRITIN, TIBC, IRON, RETICCTPCT in the last 72 hours. Urinalysis    Component Value Date/Time   COLORURINE YELLOW 05/30/2016 2037   APPEARANCEUR CLEAR 05/30/2016 2037   LABSPEC 1.011 05/30/2016 2037   PHURINE 5.0 05/30/2016 2037   GLUCOSEU NEGATIVE 05/30/2016 2037   HGBUR NEGATIVE 05/30/2016 2037   HGBUR negative 01/01/2010 1353   BILIRUBINUR NEGATIVE 05/30/2016 2037   BILIRUBINUR n 05/30/2015 1542   KETONESUR NEGATIVE 05/30/2016 2037   PROTEINUR NEGATIVE 05/30/2016 2037   UROBILINOGEN 0.2 05/30/2015 1542   UROBILINOGEN 1.0 11/03/2014 1442   NITRITE NEGATIVE 05/30/2016 2037    LEUKOCYTESUR NEGATIVE 05/30/2016 2037   Sepsis Labs Invalid input(s): PROCALCITONIN,  WBC,  LACTICIDVEN Microbiology Recent Results (from the past 240 hour(s))  MRSA PCR Screening     Status: None   Collection Time: 05/31/16 12:26 AM  Result Value Ref Range Status   MRSA by PCR NEGATIVE NEGATIVE Final  Comment:        The GeneXpert MRSA Assay (FDA approved for NASAL specimens only), is one component of a comprehensive MRSA colonization surveillance program. It is not intended to diagnose MRSA infection nor to guide or monitor treatment for MRSA infections.     Time coordinating discharge: Over 30 minutes  Vance Gather, MD  [facility] 05/31/2016, 10:02 AM Pager [phone]  If 7PM-7AM, please contact night-coverage [email or web address] Password [email or web address]

## 2016-05-31 NOTE — [place] FL2 (Signed)
[place] MEDICAID FL2 LEVEL OF CARE SCREENING TOOL     IDENTIFICATION  Patient Name: Sydney Phillips Birthdate: 09-Nov-1935 Sex: female Admission Date (Current Location): 05/30/2016  [facility] and [place] Number:  Herbalist and Address:  [facility],  [place] [address] [place]      Provider Number: [id number]  Attending Physician Name and Address:  Patrecia Pour, MD  Relative Name and Phone Number:       Current Level of Care: Hospital Recommended Level of Care: [place] Prior Approval Number:    Date Approved/Denied:   PASRR Number:   [id number]  Discharge Plan: SNF    Current Diagnoses: Patient Active Problem List   Diagnosis Date Noted  . Steroid-induced psychosis, with hallucinations ([place])   . Chronic respiratory failure with hypoxia ([place])   . Acute encephalopathy 05/30/2016  . Chronic combined systolic and diastolic CHF (congestive heart failure) ([place]) 05/30/2016  . CKD (chronic kidney disease), stage III 05/30/2016  . Elevated lactic acid level 05/30/2016  . Leukocytosis   . Stenosis of left subclavian artery ([place]) 05/19/2016  . Fall at home   . Somnolence   . OSA and COPD overlap syndrome ([place])   . Cardiomyopathy, ischemic   . Ischemic mitral valve regurgitation   . Pulmonary hypertension   . Controlled diabetes mellitus type 2 with complications ([place])   . Encephalopathy   . CAD in native artery   . Acute renal failure with acute tubular necrosis superimposed on stage 3 chronic kidney disease ([place])   . COPD exacerbation ([place]) 05/14/2016  . Acute on chronic combined systolic and diastolic ACC/AHA stage C congestive heart failure ([place]) 05/14/2016  . Pleural effusion   . Acute on chronic respiratory failure with hypoxia ([place])   . CHF exacerbation ([place]) 04/06/2016  . Acute respiratory distress 04/06/2016  . Altered mental state 02/26/2016  . Syncopal episodes 02/26/2016  . Acute kidney injury ([place])  02/26/2016  . Skin lesion of back/upper right shoulder 02/26/2016  . Cardiomyopathy ([place]) 11/07/2015  . Smoking 11/07/2015  . Arthralgia 10/12/2015  . Left carotid stenosis 11/08/2014  . Diabetes mellitus ([place]) 05/02/2014  . NSTEMI (non-ST elevated myocardial infarction) ([place]) 01/20/2014  . Melena 01/20/2014  . Microcytic anemia 01/19/2014  . Chest pain 01/19/2014  . PAD (peripheral artery disease) ([place]) 10/12/2013  . S/P angioplasty with stent, lt. subclavian 07/31/11 08/01/2011  . Biventricular implantable cardioverter-defibrillator in situ 07/28/2011  . PVD, known severe, (previuosly asymptomatic) LSCA disease, now with "high grade" RICA disease. 07/28/2011  . Bronchitis, recent flare 07/28/2011  . Syncope,possible related to subclavian steal syndrome 07/27/2011  . Vertigo 07/27/2011  . Hypokalemia 07/27/2011  . SPINAL STENOSIS 01/28/2010  . CONSTIPATION 01/14/2010  . BACK PAIN, LUMBAR 01/01/2010  . INSOMNIA 01/23/2009  . ALLERGIC RHINITIS 11/10/2008  . Coronary atherosclerosis 06/21/2008  . HIP PAIN, BILATERAL 06/21/2008  . Myalgia and myositis 11/26/2007  . HLD (hyperlipidemia) 07/28/2007  . ACUTE SINUSITIS, UNSPECIFIED 07/28/2007  . WEIGHT GAIN 06/04/2007  . DYSPNEA 06/04/2007  . COPD (chronic obstructive pulmonary disease) with emphysema ([place]) 03/02/2007  . Depression with anxiety 02/25/2007  . Essential hypertension 02/25/2007  . GERD 02/25/2007  . COLONIC POLYPS, HX OF 02/25/2007    Orientation RESPIRATION BLADDER Height & Weight     Self  Normal Incontinent Weight: 163 lb 12.8 oz (74.3 kg) Height:  5\' 5"  (165.1 cm)  BEHAVIORAL SYMPTOMS/MOOD NEUROLOGICAL BOWEL NUTRITION STATUS   (none )  (none ) Incontinent Diet (  Heart Healthy/Carb Modified )  AMBULATORY STATUS COMMUNICATION OF NEEDS Skin   Extensive Assist Verbally Normal                       Personal Care Assistance Level of Assistance  Bathing, Feeding, Dressing Bathing Assistance: Maximum  assistance Feeding assistance: Limited assistance Dressing Assistance: Maximum assistance     Functional Limitations Info  Speech, Hearing, Sight Sight Info: Adequate Hearing Info: Impaired Speech Info: Adequate    SPECIAL CARE FACTORS FREQUENCY  PT (By licensed PT), OT (By licensed OT)     PT Frequency: 3 OT Frequency: 3            Contractures      Additional Factors Info  Code Status, Allergies Code Status Info: FULL CODE  Allergies Info: Potassium-containing Compounds, Azithromycin, Codeine, Darvon, Erythromycin, Meloxicam, Norco Hydrocodone-acetaminophen, Penicillins, Propoxyphene N-acetaminophen, Rofecoxib, Rosuvastatin, Statins, Sulfa Antibiotics Psychotropic Info: Cymbalta/Xanax Insulin Sliding Scale Info: Novolog daily w/ meals and bedtime        Current Medications (05/31/2016):  This is the current hospital active medication list Current Facility-Administered Medications  Medication Dose Route Frequency Provider Last Rate Last Dose  . albuterol (PROVENTIL) (2.5 MG/3ML) 0.083% nebulizer solution 2.5 mg  2.5 mg Nebulization Q4H PRN Ivor Costa, MD      . allopurinol (ZYLOPRIM) tablet 300 mg  300 mg Oral Daily Ivor Costa, MD   300 mg at 05/31/16 1035  . ALPRAZolam Duanne Moron) tablet 0.5 mg  0.5 mg Oral TID PRN Ivor Costa, MD   0.5 mg at 05/31/16 0009  . aspirin chewable tablet 81 mg  81 mg Oral Daily Ivor Costa, MD   81 mg at 05/31/16 1035  . atorvastatin (LIPITOR) tablet 40 mg  40 mg Oral q1800 Ivor Costa, MD      . bisoprolol (ZEBETA) tablet 5 mg  5 mg Oral Daily Ivor Costa, MD   5 mg at 05/31/16 1034  . dextromethorphan-guaiFENesin (MUCINEX DM) 30-600 MG per 12 hr tablet 1 tablet  1 tablet Oral BID Ivor Costa, MD   1 tablet at 05/31/16 1034  . DULoxetine (CYMBALTA) DR capsule 60 mg  60 mg Oral BID Ivor Costa, MD   60 mg at 05/31/16 1034  . enoxaparin (LOVENOX) injection 40 mg  40 mg Subcutaneous Q24H Ivor Costa, MD      . fluticasone [facility] 50 MCG/ACT nasal spray 1 spray   1 spray Each Nare Daily Ivor Costa, MD   1 spray at 05/31/16 1034  . hydrALAZINE (APRESOLINE) injection 5 mg  5 mg Intravenous Q2H PRN Ivor Costa, MD      . HYDROcodone-acetaminophen (NORCO) 10-325 MG per tablet 1 tablet  1 tablet Oral Q6H PRN Ivor Costa, MD      . insulin aspart (novoLOG) injection 0-9 Units  0-9 Units Subcutaneous TID WC Ivor Costa, MD      . insulin glargine (LANTUS) injection 7 Units  7 Units Subcutaneous QHS Ivor Costa, MD   7 Units at 05/31/16 0028  . ipratropium-albuterol (DUONEB) 0.5-2.5 (3) MG/3ML nebulizer solution 3 mL  3 mL Nebulization TID Ivor Costa, MD   3 mL at 05/31/16 1151  . LORazepam (ATIVAN) injection 0.5 mg  0.5 mg Intravenous Q4H PRN Ivor Costa, MD   0.5 mg at 05/31/16 0548  . MEDLINE mouth rinse  15 mL Mouth Rinse BID Ivor Costa, MD      . memantine (NAMENDA XR) 24 hr capsule 7 mg  7 mg Oral Daily  Ivor Costa, MD   7 mg at 05/31/16 1034  . nicotine (NICODERM CQ - dosed in mg/24 hours) patch 21 mg  21 mg Transdermal Daily Ivor Costa, MD   21 mg at 05/31/16 1034  . nitroGLYCERIN (NITROSTAT) SL tablet 0.4 mg  0.4 mg Sublingual Q5 Min x 3 PRN Ivor Costa, MD      . sodium chloride flush (NS) 0.9 % injection 3 mL  3 mL Intravenous Q12H Ivor Costa, MD   3 mL at 05/31/16 0014  . ticagrelor (BRILINTA) tablet 90 mg  90 mg Oral BID Ivor Costa, MD   90 mg at 05/31/16 1036     Discharge Medications: Please see discharge summary for a list of discharge medications.  Relevant Imaging Results:  Relevant Lab Results:   Additional Information [id number]  Rozell Searing

## 2016-05-31 NOTE — Care Management Obs Status (Signed)
[place] NOTIFICATION   Patient Details  Name: Sydney Phillips MRN: [id number] Date of Birth: 1935/10/20   Medicare Observation Status Notification Given:  Yes  Patient unable to understand notice. NCM contacted son, Gabriel Carina [phone]  to explain that pt was changed to an observation status.   Erenest Rasher, RN 05/31/2016, 6:42 PM

## 2016-06-01 NOTE — Progress Notes (Signed)
The date is 12 2017  This is an acute visit.  Level care skilled.  Facility is [facility], [facility] exercise complaint-acute visit follow-up leukocytosis.  History of present illness.  Patient is a 80 year old female recently admitted to this facility after a hospital stay for non-ST BMI-coronary artery disease status post DES 2 on 05/20/2016-also has COPD exasperation an acute on chronic kidney disease.  She presented to the ED after 3 falls at home she had an elevated troponin cardiac echo showed an ejection fraction of 25-30%-she did receive a left heart cath that revealed severe two-vessel disease at the RCA and left circumflex-she did have a 2 DES Place.  She is here for rehabilitation.  She's had consistently elevated white count as well in the hospital and update labs today shows a white count of 19,600 which is relatively stable with what her hospital values were.  Her blood cultures were negative in the hospital . Urine culture was also negative-.  She was on prednisone taper with a history of COPD exasperation.  She is not complaining of any fever chills dysuria increased cough or congestion she is afebrile  Past Medical History:  Diagnosis Date  . Arthritis   . Automatic implantable cardioverter-defibrillator in situ   . Bronchitis   . CAD (coronary artery disease)    Currently angina free, no evidence of reversible ischemia  . Chronic back pain   . Chronic constipation   . Colon polyps 2003.  2015.   HP polyps 2003.  adnomas 2015.  required referal to [facility] for colonoscopic resection of flat polyps.   . Congestive heart failure (CHF) (HCC)    New York Heart Association functional class 2, diastolic dysfunction  . COPD (chronic obstructive pulmonary disease) ([place])   . Depression with anxiety    takes Cymbalta daily  . Diabetes mellitus without complication (HCC)    no meds  . Early cataracts, bilateral   . Fibromyalgia   . GERD  (gastroesophageal reflux disease)    was on meds but was taken off;now watches what she eats  . Hemorrhoids   . History of kidney stones   . Hx of colonic polyps   . Hyperlipemia    takes Crestor daily  . Hypertension    takes Amlodipine and Metoprolol daily  . Insomnia   . LBBB (left bundle branch block)    Stress test 09/03/2010, EF 55  . Myocardial infarction   . PAD (peripheral artery disease) (HCC)    Carotid, subclavian, and lower extremity beds, currently not symptomatic  . Presence of combination internal cardiac defibrillator (ICD) and pacemaker   . Presence of permanent cardiac pacemaker   . S/P angioplasty with stent, lt. subclavian 07/31/11 08/01/2011  . Shortness of breath    when over exerting self per pt.  . Subclavian arterial stenosis, lt, with PTA/STENT 07/31/11 08/01/2011  . Syncope 07/28/2011   EF - 50-55, moderate concentric hypertrophy in left ventricle  . Urinary incontinence   . Vertigo    takes Meclizine prn         Past Surgical History:  Procedure Laterality Date  . ABDOMINAL AORTAGRAM N/A 08/12/2013   Procedure: ABDOMINAL Maxcine Ham;  Surgeon: Elam Dutch, MD;  Location: [facility] CATH LAB;  Service: Cardiovascular;  Laterality: N/A;  . ABDOMINAL HYSTERECTOMY    . APPENDECTOMY    . BACK SURGERY  2012  . BIV ICD GENERTAOR CHANGE OUT Left 02/20/2012   Procedure: BIV ICD GENERTAOR CHANGE OUT;  Surgeon: Dani Gobble  Croitoru, MD;  Location: [place] CATH LAB;  Service: Cardiovascular;  Laterality: Left;  . CARDIAC CATHETERIZATION  12/01/2007   By Dr. Melvern Banker, left heart cath,   . CARDIAC CATHETERIZATION N/A 05/20/2016   Procedure: Right/Left Heart Cath and Coronary Angiography;  Surgeon: Sherren Mocha, MD;  Location: [place] CV LAB;  Service: Cardiovascular;  Laterality: N/A;  . CARDIAC CATHETERIZATION N/A 05/20/2016   Procedure: Coronary Stent Intervention;  Surgeon: Sherren Mocha, MD;  Location: [place] CV LAB;  Service:  Cardiovascular;  Laterality: N/A;  . CARDIAC DEFIBRILLATOR PLACEMENT  05/2008   By Dr Blanch Media, [employer] CANNOT HAVE MRI's  . CAROTID ANGIOGRAM N/A 07/31/2011   Procedure: CAROTID ANGIOGRAM;  Surgeon: Lorretta Harp, MD;  Location: [facility] CATH LAB;  Service: Cardiovascular;  Laterality: N/A;  carotid angiogram and possible Lt SCA PTA  . COLONOSCOPY W/ POLYPECTOMY  12/2013  . CORONARY ANGIOPLASTY    . ENDARTERECTOMY Left 11/08/2014   Procedure: LEFT CAROTID ENDARTERECTOMY WITH HEMASHIELD PATCH ANGIOPLASTY;  Surgeon: Elam Dutch, MD;  Location: [place];  Service: Vascular;  Laterality: Left;  . ESOPHAGOGASTRODUODENOSCOPY N/A 01/20/2014   Procedure: ESOPHAGOGASTRODUODENOSCOPY (EGD);  Surgeon: Jerene Bears, MD;  Location: [facility] ENDOSCOPY;  Service: Endoscopy;  Laterality: N/A;  . FEMORAL-POPLITEAL BYPASS GRAFT Right 10/12/2013   Procedure:   Femoral-Peroneal trunk  bypass with nonreversed greater saphenous vein graft;  Surgeon: Elam Dutch, MD;  Location: [place];  Service: Vascular;  Laterality: Right;  . GIVENS CAPSULE STUDY N/A 01/20/2014   Procedure: GIVENS CAPSULE STUDY;  Surgeon: Jerene Bears, MD;  Location: [place];  Service: Gastroenterology;  Laterality: N/A;  . INTRAOPERATIVE ARTERIOGRAM Right 10/12/2013   Procedure: INTRA OPERATIVE ARTERIOGRAM;  Surgeon: Elam Dutch, MD;  Location: [place];  Service: Vascular;  Laterality: Right;  . ORIF ELBOW FRACTURE  08/16/2011   Procedure: OPEN REDUCTION INTERNAL FIXATION (ORIF) ELBOW/OLECRANON FRACTURE;  Surgeon: Schuyler Amor, MD;  Location: [place];  Service: Orthopedics;  Laterality: Left;  . RENAL ANGIOGRAM N/A 08/12/2013   Procedure: RENAL ANGIOGRAM;  Surgeon: Elam Dutch, MD;  Location: [facility] CATH LAB;  Service: Cardiovascular;  Laterality: N/A;  . SUBCLAVIAN STENT PLACEMENT Left 07/31/2011   7x18 Genesis, balloon, with reduction of 90% ostial left subclavian artery stenosis to 0% with residual excellent flow  . TONSILLECTOMY     . TUBAL LIGATION      Patient Care Team: Laurey Morale, MD as PCP - General Lelon Perla, MD as Consulting Physician (Cardiology) Jerene Bears, MD as Consulting Physician (Gastroenterology) Loletta Specter, RN as [place] D Matthews, RN as [place] [facility]  Social History        Social History  . Marital status: Widowed    Spouse name: N/A  . Number of children: 3  . Years of education: N/A      Occupational History  . Not on file.         Social History Main Topics  . Smoking status: Current Every Day Smoker    Packs/day: 1.00    Years: 62.00    Types: Cigarettes  . Smokeless tobacco: Never Used     Comment: Down to 3-4 cigarettes per day  . Alcohol use No  . Drug use: No  . Sexual activity: No       Other Topics Concern  . Not on file      Social History Narrative   Ok to share information with  medical POA, Son Gabriel Carina     reports that she has been smoking Cigarettes.  She has a 62.00 pack-year smoking history. She has never used smokeless tobacco. She reports that she does not drink alcohol or use drugs.        Family History  Problem Relation Age of Onset  . CAD Father   . Heart disease Father   . Hyperlipidemia Father   . Heart disease Mother   . Deep vein thrombosis Son   . Hyperlipidemia    . Colon cancer Maternal Grandmother   . Cancer Sister     ovarian  . Diabetes Sister   . Heart disease Sister   . Anesthesia problems Neg Hx   . Hypotension Neg Hx   . Malignant hyperthermia Neg Hx   . Pseudochol deficiency Neg Hx        Family Status  Relation Status  . Father Deceased  . Mother Deceased  . Son Alive  .    Marland Kitchen Maternal Grandmother   . Sister   . Neg Hx         Immunization History  Administered Date(s) Administered  . Influenza Split 06/03/2011, 05/06/2012  . Influenza Whole 05/17/2003, 04/02/2009    . Influenza, High Dose Seasonal PF 05/30/2015  . Influenza,inj,Quad PF,36+ Mos 04/20/2013, 05/02/2014, 02/27/2016  . Pneumococcal Conjugate-13 09/07/2010  . Pneumococcal Polysaccharide-23 10/13/2013         Allergies  Allergen Reactions  . Potassium-Containing Compounds Other (See Comments)    Causes severe constipation  . Azithromycin Rash  . Codeine Itching  . Darvon Itching  . Erythromycin Rash  . Meloxicam Other (See Comments)    Unknown   . Norco [Hydrocodone-Acetaminophen] Itching  . Penicillins Rash    Has patient had a PCN reaction causing immediate rash, facial/tongue/throat swelling, SOB or lightheadedness with hypotension: unknown Has patient had a PCN reaction causing severe rash involving mucus membranes or skin necrosis: no Has patient had a PCN reaction that required hospitalization: unknown Has patient had a PCN reaction occurring within the last 10 years: no If all of the above answers are "NO", then may proceed with Cephalosporin use.  Marland Kitchen Propoxyphene N-Acetaminophen Itching  . Rofecoxib Other (See Comments)    Unknown   . Rosuvastatin Other (See Comments)    cramps  . Statins Itching and Other (See Comments)    Sleeplessness  . Sulfa Antibiotics Rash    Medications:     Patient's Medications  New Prescriptions   No medications on file  Previous Medications   ALBUTEROL (PROVENTIL HFA;VENTOLIN HFA) 108 (90 BASE) MCG/ACT INHALER    Inhale 2 puffs into the lungs every 4 (four) hours as needed for wheezing or shortness of breath.   ALLOPURINOL (ZYLOPRIM) 300 MG TABLET    Take 1 tablet (300 mg total) by mouth daily.   ALPRAZOLAM (XANAX) 0.5 MG TABLET    Take 1 tablet (0.5 mg total) by mouth 3 (three) times daily as needed for anxiety.   ASPIRIN 81 MG CHEWABLE TABLET    Chew 1 tablet (81 mg total) by mouth daily.   ATORVASTATIN (LIPITOR) 40 MG TABLET    Take 1 tablet (40 mg total) by mouth daily at 6 PM.   BISOPROLOL (ZEBETA) 5 MG  TABLET    Take 1 tablet (5 mg total) by mouth daily.   DEXTROMETHORPHAN-GUAIFENESIN (MUCINEX DM) 30-600 MG 12HR TABLET    Take 1 tablet by mouth 2 (two) times daily.   DULOXETINE (CYMBALTA)  60 MG CAPSULE    TAKE ONE CAPSULE BY MOUTH TWICE DAILY   FLUTICASONE (CLARISPRAY) 50 MCG/ACT NASAL SPRAY    Place 1 spray into both nostrils daily.   FLUTICASONE (FLONASE) 50 MCG/ACT NASAL SPRAY    Place 1 spray into both nostrils daily as needed for allergies or rhinitis.   HYDROCODONE-ACETAMINOPHEN (NORCO) 10-325 MG TABLET    Take 1 tablet by mouth every 6 (six) hours as needed for moderate pain.   INSULIN GLARGINE (LANTUS) 100 UNIT/ML INJECTION    Inject 10 Units into the skin at bedtime.   IPRATROPIUM-ALBUTEROL (DUONEB) 0.5-2.5 (3) MG/3ML SOLN    Take 3 mLs by nebulization 3 (three) times daily.   LEVOFLOXACIN (LEVAQUIN) 750 MG TABLET    Take 1 tablet (750 mg total) by mouth every other day.   NITROGLYCERIN (NITROSTAT) 0.4 MG SL TABLET    Place 1 tablet (0.4 mg total) under the tongue every 5 (five) minutes x 3 doses as needed for chest pain.   POTASSIUM CHLORIDE SA (K-DUR,KLOR-CON) 20 MEQ TABLET    Take 1 tablet (20 mEq total) by mouth daily.   TICAGRELOR (BRILINTA) 90 MG TABS TABLET    Take 1 tablet (90 mg total) by mouth 2 (two) times daily.   TORSEMIDE (DEMADEX) 20 MG TABLET    Take 20 mg by mouth 2 (two) times daily.  Modified Medications   No medications on file  Discontinued Medications   ACCU-CHEK FASTCLIX LANCETS MISC    USE   TO CHECK GLUCOSE ONCE DAILY   ACCU-CHEK SMARTVIEW TEST STRIP    CHECK BLOOD SUGAR ONCE DAILY   FUROSEMIDE (LASIX) 40 MG TABLET    Take 1 tablet (40 mg total) by mouth 2 (two) times daily.   INSULIN GLARGINE (LANTUS) 100 UNIT/ML INJECTION    Inject 0.12 mLs (12 Units total) into the skin daily.   NICOTINE (NICODERM CQ - DOSED IN MG/24 HOURS) 14 MG/24HR PATCH    Place 1 patch (14 mg total) onto the skin daily.   PREDNISONE (DELTASONE) 5 MG TABLET    4  tablets 3 days, 3 tablets 3 days, 2 tablets 3 days, 1 tablet 3 days, then DC    Review of Systems  This is limited secondary to her appears to be some element of dementia but she is denying any dysuria and increased cough or congestion from baseline no fever no chills denies diarrhea   Temperature is 96.4 pulse 86 respirations 18 blood pressure 118/88  Physical Exam  Constitutional: She appears well-developed.  Looks frail in NAD.  [place] O2 intact. Lying in bed Her skin is warm and dry HENT:  Mouth/Throat: Oropharynx is clear and moist. No oropharyngeal exudate.  MMM. No oral thrush  Eyes: Pupils are equal, round, and reactive to light. No scleral icterus.  Neck: Neck supple. Carotid bruit is not present. No tracheal deviation present.  Cardiovascular: Normal rate, regular rhythm and intact distal pulses.  Exam reveals no gallop and no friction rub.   Murmur (1/6 SEM) heard. No LE edema b/l. no calf TTP.   Pulmonary/Chest: Effort normal and breath sounds normal. No stridor. No respiratory distress. She has no wheezes. She has no rales. Somewhat shallow air entry  Reduced BS at base b/l. No wheezing  Abdominal: Soft. Bowel sounds are normal. She exhibits no distension and no mass. There is no hepatomegaly. There is no tenderness. There is no rebound and no guarding.  GU could not appreciate any suprapubic tenderness  Musculoskeletal: She  exhibits edema.  Lymphadenopathy:    She has no cervical adenopathy.  Neurological: She is alert. No specific lateralizing findings     Psychiatric: She has a normal mood and affect. Her behavior is normal. Thought content is just jointed appears somewhat confused but cooperative with exam    Labs reviewed:  05/27/2016.  White count 19.6 hemoglobin 15.0 platelets 270.  Sodium 143 potassium 4 BUN 23.2 creatinine 1.34-.  Liver function tests within normal limits        Admission on 05/14/2016, Discharged on 05/23/2016  No results  displayed because visit has over 200 results.    CBC Latest Ref Rng & Units 05/23/2016 05/22/2016 05/21/2016  WBC 4.0 - 10.5 K/uL 20.5(H) 20.9(H) 21.1(H)  Hemoglobin 12.0 - 15.0 g/dL 14.5 13.8 12.5  Hematocrit 36.0 - 46.0 % 45.2 43.2 39.5  Platelets 150 - 400 K/uL 326 343 330   CMP Latest Ref Rng & Units 05/23/2016 05/22/2016 05/21/2016  Glucose 65 - 99 mg/dL 90 80 76  BUN 6 - 20 mg/dL 27(H) 24(H) 32(H)  Creatinine 0.44 - 1.00 mg/dL 1.21(H) 1.27(H) 1.42(H)  Sodium 135 - 145 mmol/L 140 141 143  Potassium 3.5 - 5.1 mmol/L 3.4(L) 3.6 4.0  Chloride 101 - 111 mmol/L 96(L) 99(L) 101  CO2 22 - 32 mmol/L 31 30 33(H)  Calcium 8.9 - 10.3 mg/dL 9.6 9.0 9.2  Total Protein 6.5 - 8.1 g/dL 6.3(L) 5.8(L) -  Total Bilirubin 0.3 - 1.2 mg/dL 0.8 0.9 -  Alkaline Phos 38 - 126 U/L 64 57 -  AST 15 - 41 U/L 39 32 -  ALT 14 - 54 U/L 30 24 -        Lab Results  Component Value Date   HGBA1C 6.9 (H) 05/15/2016   Lipid Panel          Component Value Date/Time   CHOL 193 05/15/2016 0248   TRIG 116 05/15/2016 0248   HDL 25 (L) 05/15/2016 0248   CHOLHDL 7.7 05/15/2016 0248   VLDL 23 05/15/2016 0248   LDLCALC 145 (H) 05/15/2016 0248   LDLDIRECT 152.0 05/30/2015 1452    Office Visit on 05/02/2016  Component Date Value Ref Range Status  . WBC 05/02/2016 8.1  4.0 - 10.5 K/uL Final  . RBC 05/02/2016 4.47  3.87 - 5.11 Mil/uL Final  . Hemoglobin 05/02/2016 13.9  12.0 - 15.0 g/dL Final  . HCT 05/02/2016 43.0  36.0 - 46.0 % Final  . MCV 05/02/2016 96.2  78.0 - 100.0 fl Final  . MCHC 05/02/2016 32.5  30.0 - 36.0 g/dL Final  . RDW 05/02/2016 15.4  11.5 - 15.5 % Final  . Platelets 05/02/2016 684.0 Repeated and verified X2.* 150.0 - 400.0 K/uL Final  . Neutrophils Relative % 05/02/2016 63.5  43.0 - 77.0 % Final  . Lymphocytes Relative 05/02/2016 22.3  12.0 - 46.0 % Final  . Monocytes Relative 05/02/2016 8.5  3.0 - 12.0 % Final  . Eosinophils Relative 05/02/2016 5.3* 0.0 - 5.0 % Final  . Basophils  Relative 05/02/2016 0.4  0.0 - 3.0 % Final  . Neutro Abs 05/02/2016 5.2  1.4 - 7.7 K/uL Final  . Lymphs Abs 05/02/2016 1.8  0.7 - 4.0 K/uL Final  . Monocytes Absolute 05/02/2016 0.7  0.1 - 1.0 K/uL Final  . Eosinophils Absolute 05/02/2016 0.4  0.0 - 0.7 K/uL Final  . Basophils Absolute 05/02/2016 0.0  0.0 - 0.1 K/uL Final  Office Visit on 04/16/2016  Component Date Value Ref Range Status  .  Sodium 04/17/2016 135  135 - 145 mEq/L Final  . Potassium 04/17/2016 3.9  3.5 - 5.1 mEq/L Final  . Chloride 04/17/2016 91* 96 - 112 mEq/L Final  . CO2 04/17/2016 30  19 - 32 mEq/L Final  . Glucose, Bld 04/17/2016 290* 70 - 99 mg/dL Final  . BUN 04/17/2016 58* 6 - 23 mg/dL Final  . Creatinine, Ser 04/17/2016 1.92* 0.40 - 1.20 mg/dL Final  . Calcium 04/17/2016 10.2  8.4 - 10.5 mg/dL Final  . GFR 04/17/2016 26.83* >60.00 mL/min Final  . WBC 04/17/2016 24.4 Repeated and verified X2.* 4.0 - 10.5 K/uL Final  . RBC 04/17/2016 5.12* 3.87 - 5.11 Mil/uL Final  . Hemoglobin 04/17/2016 16.4* 12.0 - 15.0 g/dL Final  . HCT 04/17/2016 49.4* 36.0 - 46.0 % Final  . MCV 04/17/2016 96.5  78.0 - 100.0 fl Final  . MCHC 04/17/2016 33.1  30.0 - 36.0 g/dL Final  . RDW 04/17/2016 15.1  11.5 - 15.5 % Final  . Platelets 04/17/2016 338.0  150.0 - 400.0 K/uL Final  . Neutrophils Relative % 04/17/2016 91.7* 43.0 - 77.0 % Final  . Lymphocytes Relative 04/17/2016 5.0* 12.0 - 46.0 % Final  . Monocytes Relative 04/17/2016 3.1  3.0 - 12.0 % Final  . Eosinophils Relative 04/17/2016 0.0  0.0 - 5.0 % Final  . Basophils Relative 04/17/2016 0.2  0.0 - 3.0 % Final  . Neutro Abs 04/17/2016 22.4* 1.4 - 7.7 K/uL Final  . Lymphs Abs 04/17/2016 1.2  0.7 - 4.0 K/uL Final  . Monocytes Absolute 04/17/2016 0.8  0.1 - 1.0 K/uL Final  . Eosinophils Absolute 04/17/2016 0.0  0.0 - 0.7 K/uL Final  . Basophils Absolute 04/17/2016 0.1       Assessment and plan.  Leukocytosis of unclear etiology-she does not appear to show signs of sepsis-white  count is comparable with what it was in the hospital at this point will monitor for any changes and this was discussed with Dr. Eulas Post via phone who saw patient yesterday as well.  #2 coronary artery disease hypertension hyperlipidemia-she is status post non-ST E MI and cardiac cath 2 DES-has an ejection fraction of 25-30% she has an AICD.  Currently is taking aspirin for Brilanta combination for 12 months-regards to [facility] is on Demadex with K Dur-also on a beta blocker and when necessary nitroglycerin.  She is followed by cardiology.  Regards to COPD she is on duo nebs Flonase Mucinex as well as albuterol HFA.  Diabetes which is insulin-dependent hemoglobin A1c was 6.9-CBGs run from 103-276 recently I do not see any evidence for low blood sugars at this point will monitor.  [id number]

## 2016-06-02 ENCOUNTER — Telehealth: Payer: Self-pay

## 2016-06-02 NOTE — Telephone Encounter (Addendum)
Referred to [facility] clinic by Dr Sallyanne Kuster.  Attempted call to patient for ICM program intro and left message with direct ICM number for return call.  ICM automatic remote transmission scheduled for 06/19/2016.  Will request manual transmission when patient is reached for enrollment.

## 2016-06-03 ENCOUNTER — Emergency Department (HOSPITAL_COMMUNITY)
Admission: EM | Admit: 2016-06-03 | Discharge: 2016-06-03 | Disposition: A | Discharge status: Home / Self Care | Payer: Medicare Other | Attending: Emergency Medicine | Admitting: Emergency Medicine

## 2016-06-03 ENCOUNTER — Emergency Department (HOSPITAL_COMMUNITY): Payer: Medicare Other

## 2016-06-03 ENCOUNTER — Encounter (HOSPITAL_COMMUNITY): Payer: Self-pay

## 2016-06-03 DIAGNOSIS — Z794 Long term (current) use of insulin: Secondary | ICD-10-CM | POA: Insufficient documentation

## 2016-06-03 DIAGNOSIS — Y92129 Unspecified place in nursing home as the place of occurrence of the external cause: Secondary | ICD-10-CM | POA: Insufficient documentation

## 2016-06-03 DIAGNOSIS — N183 Chronic kidney disease, stage 3 (moderate): Secondary | ICD-10-CM | POA: Insufficient documentation

## 2016-06-03 DIAGNOSIS — I130 Hypertensive heart and chronic kidney disease with heart failure and stage 1 through stage 4 chronic kidney disease, or unspecified chronic kidney disease: Secondary | ICD-10-CM | POA: Insufficient documentation

## 2016-06-03 DIAGNOSIS — I5042 Chronic combined systolic (congestive) and diastolic (congestive) heart failure: Secondary | ICD-10-CM | POA: Insufficient documentation

## 2016-06-03 DIAGNOSIS — I2510 Atherosclerotic heart disease of native coronary artery without angina pectoris: Secondary | ICD-10-CM | POA: Insufficient documentation

## 2016-06-03 DIAGNOSIS — W1839XA Other fall on same level, initial encounter: Secondary | ICD-10-CM | POA: Insufficient documentation

## 2016-06-03 DIAGNOSIS — S0990XA Unspecified injury of head, initial encounter: Secondary | ICD-10-CM | POA: Diagnosis not present

## 2016-06-03 DIAGNOSIS — W19XXXA Unspecified fall, initial encounter: Secondary | ICD-10-CM

## 2016-06-03 DIAGNOSIS — Z950 Presence of cardiac pacemaker: Secondary | ICD-10-CM | POA: Diagnosis not present

## 2016-06-03 DIAGNOSIS — S0003XA Contusion of scalp, initial encounter: Secondary | ICD-10-CM | POA: Diagnosis not present

## 2016-06-03 DIAGNOSIS — F17210 Nicotine dependence, cigarettes, uncomplicated: Secondary | ICD-10-CM | POA: Insufficient documentation

## 2016-06-03 DIAGNOSIS — Z7982 Long term (current) use of aspirin: Secondary | ICD-10-CM | POA: Diagnosis not present

## 2016-06-03 DIAGNOSIS — Z955 Presence of coronary angioplasty implant and graft: Secondary | ICD-10-CM | POA: Insufficient documentation

## 2016-06-03 DIAGNOSIS — R51 Headache: Secondary | ICD-10-CM | POA: Diagnosis not present

## 2016-06-03 DIAGNOSIS — Z79899 Other long term (current) drug therapy: Secondary | ICD-10-CM | POA: Insufficient documentation

## 2016-06-03 DIAGNOSIS — Y9389 Activity, other specified: Secondary | ICD-10-CM | POA: Insufficient documentation

## 2016-06-03 DIAGNOSIS — J449 Chronic obstructive pulmonary disease, unspecified: Secondary | ICD-10-CM | POA: Diagnosis not present

## 2016-06-03 DIAGNOSIS — Y999 Unspecified external cause status: Secondary | ICD-10-CM | POA: Insufficient documentation

## 2016-06-03 DIAGNOSIS — I252 Old myocardial infarction: Secondary | ICD-10-CM | POA: Insufficient documentation

## 2016-06-03 DIAGNOSIS — E1122 Type 2 diabetes mellitus with diabetic chronic kidney disease: Secondary | ICD-10-CM | POA: Insufficient documentation

## 2016-06-03 DIAGNOSIS — S199XXA Unspecified injury of neck, initial encounter: Secondary | ICD-10-CM | POA: Diagnosis not present

## 2016-06-03 DIAGNOSIS — M542 Cervicalgia: Secondary | ICD-10-CM | POA: Diagnosis not present

## 2016-06-03 DIAGNOSIS — S0093XA Contusion of unspecified part of head, initial encounter: Secondary | ICD-10-CM

## 2016-06-03 HISTORY — DX: Unspecified dementia, unspecified severity, without behavioral disturbance, psychotic disturbance, mood disturbance, and anxiety: F03.90

## 2016-06-03 NOTE — ED Provider Notes (Signed)
[facility] DEPT Provider Note   CSN: [id number] Arrival date & time: 06/03/16  0720     History   Chief Complaint Chief Complaint  Patient presents with  . Fall    HPI Sydney Phillips is a 80 y.o. female who was recently discharged who presents after a fall at her nursing facility. Patient reports waking up and standing by her dresser this morning, turning around, and falling to the floor and hitting her head. Patient denies feeling lightheaded or dizzy prior to her fall. Patient reports pain around the knee up on the floor. Patient has a hematoma and hurt at the time of incident, but denies pain now. She denies any loss of consciousness. Patient denies any chest pain, shortness of breath out of the ordinary, neck pain, back pain, abdominal pain, nausea, vomiting, urinary symptoms.  HPI  Past Medical History:  Diagnosis Date  . Arthritis   . Automatic implantable cardioverter-defibrillator in situ   . Bronchitis   . CAD (coronary artery disease)    Currently angina free, no evidence of reversible ischemia  . Chronic back pain   . Chronic constipation   . Colon polyps 2003.  2015.   HP polyps 2003.  adnomas 2015.  required referal to [facility] for colonoscopic resection of flat polyps.   . Congestive heart failure (CHF) (HCC)    New York Heart Association functional class 2, diastolic dysfunction  . COPD (chronic obstructive pulmonary disease) ([place])   . Dementia   . Depression with anxiety    takes Cymbalta daily  . Diabetes mellitus without complication (HCC)    no meds  . Early cataracts, bilateral   . Fibromyalgia   . GERD (gastroesophageal reflux disease)    was on meds but was taken off;now watches what she eats  . Hemorrhoids   . History of kidney stones   . Hx of colonic polyps   . Hyperlipemia    takes Crestor daily  . Hypertension    takes Amlodipine and Metoprolol daily  . Insomnia   . LBBB (left bundle branch block)    Stress test 09/03/2010, EF 55    . Myocardial infarction   . PAD (peripheral artery disease) (HCC)    Carotid, subclavian, and lower extremity beds, currently not symptomatic  . Presence of combination internal cardiac defibrillator (ICD) and pacemaker   . Presence of permanent cardiac pacemaker   . S/P angioplasty with stent, lt. subclavian 07/31/11 08/01/2011  . Shortness of breath    when over exerting self per pt.  . Subclavian arterial stenosis, lt, with PTA/STENT 07/31/11 08/01/2011  . Syncope 07/28/2011   EF - 50-55, moderate concentric hypertrophy in left ventricle  . Urinary incontinence   . Vertigo    takes Meclizine prn    Patient Active Problem List   Diagnosis Date Noted  . 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 Chronic respiratory failure with hypoxia ([place])   . Acute encephalopathy 05/30/2016  . Chronic combined systolic and diastolic CHF (congestive heart failure) ([place]) 05/30/2016  . CKD (chronic kidney disease), stage III 05/30/2016  . Elevated lactic acid level 05/30/2016  . Leukocytosis   . Stenosis of left subclavian artery ([place]) 05/19/2016  . Fall at home   . Somnolence   . OSA and COPD overlap syndrome ([place])   . Cardiomyopathy, ischemic   . Ischemic mitral valve regurgitation   . Pulmonary hypertension   . Controlled diabetes mellitus type 2 with complications ([place])   . Encephalopathy   .  CAD in native artery   . Acute renal failure with acute tubular necrosis superimposed on stage 3 chronic kidney disease ([place])   . COPD exacerbation ([place]) 05/14/2016  . Acute on chronic combined systolic and diastolic ACC/AHA stage C congestive heart failure ([place]) 05/14/2016  . Pleural effusion   . Acute on chronic respiratory failure with hypoxia ([place])   . CHF exacerbation ([place]) 04/06/2016  . Acute respiratory distress 04/06/2016  . Altered mental state 02/26/2016  . Syncopal episodes 02/26/2016  . Acute kidney injury ([place]) 02/26/2016  . Skin lesion of back/upper right shoulder  02/26/2016  . Cardiomyopathy ([place]) 11/07/2015  . Smoking 11/07/2015  . Arthralgia 10/12/2015  . Left carotid stenosis 11/08/2014  . Diabetes mellitus ([place]) 05/02/2014  . NSTEMI (non-ST elevated myocardial infarction) ([place]) 01/20/2014  . Melena 01/20/2014  . Microcytic anemia 01/19/2014  . Chest pain 01/19/2014  . PAD (peripheral artery disease) ([place]) 10/12/2013  . S/P angioplasty with stent, lt. subclavian 07/31/11 08/01/2011  . Biventricular implantable cardioverter-defibrillator in situ 07/28/2011  . PVD, known severe, (previuosly asymptomatic) LSCA disease, now with "high grade" RICA disease. 07/28/2011  . Bronchitis, recent flare 07/28/2011  . Syncope,possible related to subclavian steal syndrome 07/27/2011  . Vertigo 07/27/2011  . Hypokalemia 07/27/2011  . SPINAL STENOSIS 01/28/2010  . CONSTIPATION 01/14/2010  . BACK PAIN, LUMBAR 01/01/2010  . INSOMNIA 01/23/2009  . ALLERGIC RHINITIS 11/10/2008  . Coronary atherosclerosis 06/21/2008  . HIP PAIN, BILATERAL 06/21/2008  . Myalgia and myositis 11/26/2007  . HLD (hyperlipidemia) 07/28/2007  . ACUTE SINUSITIS, UNSPECIFIED 07/28/2007  . WEIGHT GAIN 06/04/2007  . DYSPNEA 06/04/2007  . COPD (chronic obstructive pulmonary disease) with emphysema ([place]) 03/02/2007  . Depression with anxiety 02/25/2007  . Essential hypertension 02/25/2007  . GERD 02/25/2007  . COLONIC POLYPS, HX OF 02/25/2007    Past Surgical History:  Procedure Laterality Date  . ABDOMINAL AORTAGRAM N/A 08/12/2013   Procedure: ABDOMINAL Maxcine Ham;  Surgeon: Elam Dutch, MD;  Location: [facility] CATH LAB;  Service: Cardiovascular;  Laterality: N/A;  . ABDOMINAL HYSTERECTOMY    . APPENDECTOMY    . BACK SURGERY  2012  . BIV ICD GENERTAOR CHANGE OUT Left 02/20/2012   Procedure: BIV ICD GENERTAOR CHANGE OUT;  Surgeon: [name], MD;  Location: [facility] CATH LAB;  Service: Cardiovascular;  Laterality: Left;  . CARDIAC CATHETERIZATION  12/01/2007   By Dr. Melvern Banker, left heart  cath,   . CARDIAC CATHETERIZATION N/A 05/20/2016   Procedure: Right/Left Heart Cath and Coronary Angiography;  Surgeon: Sherren Mocha, MD;  Location: [facility] CV LAB;  Service: Cardiovascular;  Laterality: N/A;  . CARDIAC CATHETERIZATION N/A 05/20/2016   Procedure: Coronary Stent Intervention;  Surgeon: Sherren Mocha, MD;  Location: [facility] CV LAB;  Service: Cardiovascular;  Laterality: N/A;  . CARDIAC DEFIBRILLATOR PLACEMENT  05/2008   By Dr Blanch Media, [employer] CANNOT HAVE MRI's  . CAROTID ANGIOGRAM N/A 07/31/2011   Procedure: CAROTID ANGIOGRAM;  Surgeon: Lorretta Harp, MD;  Location: [facility] CATH LAB;  Service: Cardiovascular;  Laterality: N/A;  carotid angiogram and possible Lt SCA PTA  . COLONOSCOPY W/ POLYPECTOMY  12/2013  . CORONARY ANGIOPLASTY    . ENDARTERECTOMY Left 11/08/2014   Procedure: LEFT CAROTID ENDARTERECTOMY WITH HEMASHIELD PATCH ANGIOPLASTY;  Surgeon: Elam Dutch, MD;  Location: [facility];  Service: Vascular;  Laterality: Left;  . ESOPHAGOGASTRODUODENOSCOPY N/A 01/20/2014   Procedure: ESOPHAGOGASTRODUODENOSCOPY (EGD);  Surgeon: Jerene Bears, MD;  Location[facility] ENDOSCOPY;  Service: Endoscopy;  Laterality: N/A;  . FEMORAL-POPLITEAL BYPASS  GRAFT Right 10/12/2013   Procedure:   Femoral-Peroneal trunk  bypass with nonreversed greater saphenous vein graft;  Surgeon: Elam Dutch, MD;  Location: [facility];  Service: Vascular;  Laterality: Right;  . GIVENS CAPSULE STUDY N/A 01/20/2014   Procedure: GIVENS CAPSULE STUDY;  Surgeon: Jerene Bears, MD;  Location: [facility];  Service: Gastroenterology;  Laterality: N/A;  . INTRAOPERATIVE ARTERIOGRAM Right 10/12/2013   Procedure: INTRA OPERATIVE ARTERIOGRAM;  Surgeon: Elam Dutch, MD;  Location: [facility];  Service: Vascular;  Laterality: Right;  . ORIF ELBOW FRACTURE  08/16/2011   Procedure: OPEN REDUCTION INTERNAL FIXATION (ORIF) ELBOW/OLECRANON FRACTURE;  Surgeon: Schuyler Amor, MD;  Location: [facility];  Service: Orthopedics;  Laterality:  Left;  . RENAL ANGIOGRAM N/A 08/12/2013   Procedure: RENAL ANGIOGRAM;  Surgeon: Elam Dutch, MD;  Location: [facility] CATH LAB;  Service: Cardiovascular;  Laterality: N/A;  . SUBCLAVIAN STENT PLACEMENT Left 07/31/2011   7x18 [place], balloon, with reduction of 90% ostial left subclavian artery stenosis to 0% with residual excellent flow  . TONSILLECTOMY    . TUBAL LIGATION      OB History    No data available       Home Medications    Prior to Admission medications   Medication Sig Start Date End Date Taking? Authorizing Provider  albuterol (PROVENTIL HFA;VENTOLIN HFA) 108 (90 Base) MCG/ACT inhaler Inhale 2 puffs into the lungs every 4 (four) hours as needed for wheezing or shortness of breath. 10/12/15  Yes Laurey Morale, MD  allopurinol (ZYLOPRIM) 300 MG tablet Take 1 tablet (300 mg total) by mouth daily. 10/15/15  Yes Laurey Morale, MD  ALPRAZolam Duanne Moron) 0.5 MG tablet Take 1 tablet (0.5 mg total) by mouth 3 (three) times daily as needed for anxiety. 05/23/16  Yes Reyne Dumas, MD  aspirin 81 MG chewable tablet Chew 1 tablet (81 mg total) by mouth daily. 05/23/16  Yes Reyne Dumas, MD  atorvastatin (LIPITOR) 40 MG tablet Take 1 tablet (40 mg total) by mouth daily at 6 PM. 05/23/16  Yes Reyne Dumas, MD  bisoprolol (ZEBETA) 5 MG tablet Take 1 tablet (5 mg total) by mouth daily. 05/23/16  Yes Reyne Dumas, MD  dextromethorphan-guaiFENesin (MUCINEX DM) 30-600 MG 12hr tablet Take 1 tablet by mouth 2 (two) times daily. 05/23/16  Yes Reyne Dumas, MD  DULoxetine (CYMBALTA) 60 MG capsule TAKE ONE CAPSULE BY MOUTH TWICE DAILY 02/04/16  Yes Laurey Morale, MD  fluticasone (CLARISPRAY) 50 MCG/ACT nasal spray Place 1 spray into both nostrils daily as needed for allergies.    Yes Historical Provider, MD  fluticasone (FLONASE) 50 MCG/ACT nasal spray Place 1 spray into both nostrils daily.    Yes Historical Provider, MD  HYDROcodone-acetaminophen (NORCO) 10-325 MG tablet Take one tablet by mouth every 6 hours as  needed for pain. DNE 3gm of APAP from all sources/24hours 05/31/16  Yes Patrecia Pour, MD  insulin glargine (LANTUS) 100 UNIT/ML injection Inject 10 Units into the skin at bedtime.   Yes Historical Provider, MD  ipratropium-albuterol (DUONEB) 0.5-2.5 (3) MG/3ML SOLN Take 3 mLs by nebulization 3 (three) times daily. 05/23/16  Yes Reyne Dumas, MD  levofloxacin (LEVAQUIN) 750 MG tablet Take 750 mg by mouth every other day. Prophylaxis. Starting 05/24/16.  No stop date indicated. 05/23/16  Yes Historical Provider, MD  memantine (NAMENDA XR) 7 MG CP24 24 hr capsule Take 7-14 mg by mouth daily.    Yes Historical Provider, MD  nitroGLYCERIN (NITROSTAT) 0.4 MG  SL tablet Place 1 tablet (0.4 mg total) under the tongue every 5 (five) minutes x 3 doses as needed for chest pain. 10/12/15  Yes Laurey Morale, MD  potassium chloride SA (K-DUR,KLOR-CON) 20 MEQ tablet Take 1 tablet (20 mEq total) by mouth daily. 05/23/16  Yes Reyne Dumas, MD  ticagrelor (BRILINTA) 90 MG TABS tablet Take 1 tablet (90 mg total) by mouth 2 (two) times daily. 05/23/16  Yes Reyne Dumas, MD  torsemide (DEMADEX) 20 MG tablet Take 20 mg by mouth 2 (two) times daily.   Yes Historical Provider, MD    Family History Family History  Problem Relation Age of Onset  . CAD Father   . Heart disease Father   . Hyperlipidemia Father   . Heart disease Mother   . Deep vein thrombosis Son   . Hyperlipidemia    . Colon cancer Maternal Grandmother   . Cancer Sister     ovarian  . Diabetes Sister   . Heart disease Sister   . Anesthesia problems Neg Hx   . Hypotension Neg Hx   . Malignant hyperthermia Neg Hx   . Pseudochol deficiency Neg Hx     Social History Social History  Substance Use Topics  . Smoking status: Current Every Day Smoker    Packs/day: 1.00    Years: 62.00    Types: Cigarettes  . Smokeless tobacco: Never Used     Comment: Down to 3-4 cigarettes per day  . Alcohol use No     Allergies   Potassium-containing compounds;  Azithromycin; Codeine; Darvon; Erythromycin; Meloxicam; Norco [hydrocodone-acetaminophen]; Penicillins; Propoxyphene n-acetaminophen; Rofecoxib; Rosuvastatin; Statins; and Sulfa antibiotics   Review of Systems Review of Systems  Constitutional: Negative for chills and fever.  HENT: Negative for facial swelling and sore throat.   Respiratory: Negative for shortness of breath.   Cardiovascular: Negative for chest pain.  Gastrointestinal: Negative for abdominal pain, nausea and vomiting.  Genitourinary: Negative for dysuria.  Musculoskeletal: Negative for back pain.  Skin: Positive for wound (hematoma to occiput). Negative for rash.  Neurological: Negative for headaches.  Psychiatric/Behavioral: The patient is not nervous/anxious.      Physical Exam Updated Vital Signs BP 113/70   Pulse 78   Temp 97.7 F (36.5 C) (Oral)   Resp 21   Ht 5\' 5"  (1.651 m)   Wt 73.9 kg   SpO2 99%   BMI 27.12 kg/m   Physical Exam  Constitutional: She appears well-developed and well-nourished. No distress.  HENT:  Head: Normocephalic and atraumatic.    Mouth/Throat: Oropharynx is clear and moist. No oropharyngeal exudate.  Eyes: Conjunctivae and EOM are normal. Pupils are equal, round, and reactive to light. Right eye exhibits no discharge. Left eye exhibits no discharge. No scleral icterus.  Neck: Normal range of motion. Neck supple. No spinous process tenderness and no muscular tenderness present. No thyromegaly present.  Cardiovascular: Normal rate, regular rhythm, normal heart sounds and intact distal pulses.  Exam reveals no gallop and no friction rub.   No murmur heard. Pulmonary/Chest: Effort normal and breath sounds normal. No stridor. No respiratory distress. She has no wheezes. She has no rales.  Abdominal: Soft. Bowel sounds are normal. She exhibits no distension. There is no tenderness. There is no rebound and no guarding.  Musculoskeletal: She exhibits no edema.  Lymphadenopathy:     She has no cervical adenopathy.  Neurological: She is alert. Coordination normal.  CN 3-12 intact; normal sensation throughout; 5/5 strength in all 4 extremities; equal  bilateral grip strength  Skin: Skin is warm and dry. No rash noted. She is not diaphoretic. No pallor.  Psychiatric: She has a normal mood and affect.  Nursing note and vitals reviewed.    ED Treatments / Results  Labs (all labs ordered are listed, but only abnormal results are displayed) Labs Reviewed - No data to display  EKG  EKG Interpretation None       Radiology Ct Head Wo Contrast  Result Date: 06/03/2016 CLINICAL DATA:  Pain following fall EXAM: CT HEAD WITHOUT CONTRAST CT CERVICAL SPINE WITHOUT CONTRAST TECHNIQUE: Multidetector CT imaging of the head and cervical spine was performed following the standard protocol without intravenous contrast. Multiplanar CT image reconstructions of the cervical spine were also generated. COMPARISON:  Head CT May 30, 2016; cervical spine CT May 14, 2016 FINDINGS: CT HEAD FINDINGS Brain: Mild diffuse atrophy is stable. There is no intracranial mass, hemorrhage, extra-axial fluid collection, or midline shift. There is stable patchy small vessel disease throughout the centra semiovale bilaterally. There is no new gray-white compartment lesion. No acute infarct is evident. Vascular: There is no hyperdense vessel. There is calcification in each carotid siphon region. Skull: The bony calvarium appears intact. There is hyperostosis frontalis bilaterally. There is a high left parietal scalp hematoma. Sinuses/Orbits: Visualized paranasal sinuses are clear. Orbits appear symmetric bilaterally. Other: Mastoid air cells are clear. CT CERVICAL SPINE FINDINGS Alignment: There is minimal anterolisthesis of C4 on C5, stable. No new spondylolisthesis. There is cervicothoracic levoscoliosis. Skull base and vertebrae: The skull base and 1 craniovertebral junction regions appear unremarkable  2. There is no demonstrable fracture. No blastic or lytic bone lesions are evident. Soft tissues and spinal canal: Prevertebral soft tissues and predental space regions are normal. There is no paraspinous lesion or high-grade spinal stenosis. Disc levels: There is mild to moderate disc space narrowing at C5-6 and C6-7. There is more severe disc space narrowing visualized upper thoracic region. There is facet hypertrophy at multiple levels bilaterally. There is exit foraminal narrowing due to bony hypertrophy at C3-4 bilaterally, at C4-5 on the left, and at at C5-6 on the right, with narrowing and impression on the exiting nerve root most marked at C5-6 on the right and at C4-5 on the left. No well-defined disc extrusion is seen on this noncontrast enhanced study. At C5-6 on the right, there is asymmetric calcified disc protrusion. Upper chest: Visualized lung bases are clear. Other: There is calcification in each carotid artery. There is a 7 mm nodular opacity arising from the left lobe of the thyroid. IMPRESSION: CT head: Atrophy with patchy small vessel disease in the supratentorial white matter bilaterally. No intracranial mass hemorrhage, or acute appearing infarct. No midline shift or extra-axial fluid. High left parietal scalp hematoma. CT cervical spine: No fracture. Minimal anterolisthesis of C4 on C5, felt to be due to underlying spondylosis. Multilevel arthropathy as summarized above. Calcification in each carotid artery. Subcentimeter left lobe thyroid nodule. Electronically Signed   By: Lowella Grip III M.D.   On: 06/03/2016 08:56   Ct Cervical Spine Wo Contrast  Result Date: 06/03/2016 CLINICAL DATA:  Pain following fall EXAM: CT HEAD WITHOUT CONTRAST CT CERVICAL SPINE WITHOUT CONTRAST TECHNIQUE: Multidetector CT imaging of the head and cervical spine was performed following the standard protocol without intravenous contrast. Multiplanar CT image reconstructions of the cervical spine were also  generated. COMPARISON:  Head CT May 30, 2016; cervical spine CT May 14, 2016 FINDINGS: CT HEAD FINDINGS Brain: Mild diffuse  atrophy is stable. There is no intracranial mass, hemorrhage, extra-axial fluid collection, or midline shift. There is stable patchy small vessel disease throughout the centra semiovale bilaterally. There is no new gray-white compartment lesion. No acute infarct is evident. Vascular: There is no hyperdense vessel. There is calcification in each carotid siphon region. Skull: The bony calvarium appears intact. There is hyperostosis frontalis bilaterally. There is a high left parietal scalp hematoma. Sinuses/Orbits: Visualized paranasal sinuses are clear. Orbits appear symmetric bilaterally. Other: Mastoid air cells are clear. CT CERVICAL SPINE FINDINGS Alignment: There is minimal anterolisthesis of C4 on C5, stable. No new spondylolisthesis. There is cervicothoracic levoscoliosis. Skull base and vertebrae: The skull base and 1 craniovertebral junction regions appear unremarkable 2. There is no demonstrable fracture. No blastic or lytic bone lesions are evident. Soft tissues and spinal canal: Prevertebral soft tissues and predental space regions are normal. There is no paraspinous lesion or high-grade spinal stenosis. Disc levels: There is mild to moderate disc space narrowing at C5-6 and C6-7. There is more severe disc space narrowing visualized upper thoracic region. There is facet hypertrophy at multiple levels bilaterally. There is exit foraminal narrowing due to bony hypertrophy at C3-4 bilaterally, at C4-5 on the left, and at at C5-6 on the right, with narrowing and impression on the exiting nerve root most marked at C5-6 on the right and at C4-5 on the left. No well-defined disc extrusion is seen on this noncontrast enhanced study. At C5-6 on the right, there is asymmetric calcified disc protrusion. Upper chest: Visualized lung bases are clear. Other: There is calcification in  each carotid artery. There is a 7 mm nodular opacity arising from the left lobe of the thyroid. IMPRESSION: CT head: Atrophy with patchy small vessel disease in the supratentorial white matter bilaterally. No intracranial mass hemorrhage, or acute appearing infarct. No midline shift or extra-axial fluid. High left parietal scalp hematoma. CT cervical spine: No fracture. Minimal anterolisthesis of C4 on C5, felt to be due to underlying spondylosis. Multilevel arthropathy as summarized above. Calcification in each carotid artery. Subcentimeter left lobe thyroid nodule. Electronically Signed   By: Lowella Grip III M.D.   On: 06/03/2016 08:56    Procedures Procedures (including critical care time)  Medications Ordered in ED Medications - No data to display   Initial Impression / Assessment and Plan / ED Course  I have reviewed the triage vital signs and the nursing notes.  Pertinent labs & imaging results that were available during my care of the patient were reviewed by me and considered in my medical decision making (see chart for details).  Clinical Course     Patient with recurrent falls. No preceding symptoms following today's fall reported by patient. Patient's pacemaker interrogated and found no significant events. Patient recently discharged from hospital 3 days ago with no significant lab findings. Patient has had other recent, extensive workups for recurrent falls without conclusion. Today's imaging shows no acute trauma. CT head shows atrophy with patchy small vessel disease in the supratentorial white matter bilaterally; no intracranial mass, hemorrhage, or acute appearing infarct, no midline shift or extra-axial fluid; high left parietal scalp hematoma. CT C-spine shows no fracture; minimal anterolisthesis of C4 on C5, felt to be due to underlying spondylosis; multilevel arthropathy; subcentimeter left lobe thyroid nodule. I made patient aware of thyroid nodule. Supportive treatment  discussed for hematoma. Follow-up to PCP in 2-3 days. I discussed plan with patient's son and he is in agreement with plan. Patient vitals stable  throughout ED course and discharged in satisfactory condition. Patient also evaluated by Dr. Dayna Barker who guided the patient's management and agrees with plan.  Final Clinical Impressions(s) / ED Diagnoses   Final diagnoses:  Fall, initial encounter  Traumatic hematoma of head, initial encounter    New Prescriptions New Prescriptions   No medications on file     Frederica Kuster, PA-C 06/03/16 [place], MD 06/03/16 1655

## 2016-06-03 NOTE — ED Triage Notes (Signed)
Per [facility]: Pt is from [facility]. EMS stated that they saw in their computer system that the pt has a history of having a pacemaker, being on Plavix, and having dementia.  EMS was called to [facility] due to an unwitnessed fall. The pt states that she was standing by her dresser and looked at the clock at 0605, spun around, and then fell and hit her head on the ground. The staff reported the pt calling out for help at 0610. Per paperwork from [place] "Resident was discovered on the floor in her room next to the bed sitting [place] style." It is unknown if the pt lost consciousness. Pt does have a hematoma to the back of the head, EMS applied a towel roll to the neck for C-Spine precautions. Pt was able to tell EMS the president, who her son was, and the day of the week. She was confused about her age, this is her baseline per the son and staff. Pt does have bruises at various stages of healing all over her body. Pt is supposed to be on 2 L [place] at all time. Per the staff the pt can become combative at times.   Pt has a nicotine patch 21 mg, on the left arm, date on the patch is 12/16.

## 2016-06-03 NOTE — ED Notes (Signed)
Interrogated the pacemaker/ICD

## 2016-06-03 NOTE — ED Notes (Signed)
Sydney Phillips at bedside

## 2016-06-03 NOTE — ED Provider Notes (Signed)
Medical screening examination/treatment/procedure(s) were conducted as a shared visit with non-physician practitioner(s) and myself.  I personally evaluated the patient during the encounter.  Accompanied by her son. She is here with a history of multiple falls. She fell again today. She and her head on a dresser. On exam patient is confused but apparently this is her baseline. She has a hematoma to the left posterior occiput area. Otherwise neurologically intact. No tenderness in neck, chest, abdomen, pelvis or lower extremities or of her arms. Has some old bruises over the dorsum of both hands but otherwise no evidence of trauma. As patient has history of multiple falls and worked up. She didn't have any weakness chest pain and headache nausea vomiting with this. She is recently admitted the hospital for same thing. She has a walker and wheelchair which she does not use. Think it's in the patient's best interest to evaluate for head bleed if is negative she can be discharged back to facility. Talked to her son about possibly requesting a bed alarm so that she can get more assistance at facility.   EKG Interpretation  Date/Time:  Tuesday June 03 2016 07:32:45 EST Ventricular Rate:  75 PR Interval:    QRS Duration: 112 QT Interval:  381 QTC Calculation: 426 R Axis:   101 Text Interpretation:  Atrial-sensed ventricular-paced rhythm No further analysis attempted due to paced rhythm No significant change since last tracing Confirmed by [facility] MD, Miara Emminger [phone]) on 06/03/2016 4:55:31 PM         Merrily Pew, MD 06/03/16 1656

## 2016-06-03 NOTE — Discharge Planning (Signed)
Pt up for discharge. [facility] reviewed chart for possible CM needs.  From SNF, [place]. No needs identified or communicated.

## 2016-06-03 NOTE — Discharge Instructions (Signed)
Use ice to the bump on your head 2-3 times daily alternating 20 minutes on, 20 minutes off. You can take Tylenol as prescribed over-the-counter, as needed for your pain. Please follow-up with your primary care provider in 2-3 days for recheck of your symptoms. Please return to emergency department if you develop any new or worsening symptoms.  Your CT scan showed a "Subcentimeter left lobe thyroid nodule." Please make your primary care provider aware of this, as he may need further evaluation and follow-up.

## 2016-06-04 ENCOUNTER — Encounter: Payer: Self-pay | Admitting: *Deleted

## 2016-06-04 ENCOUNTER — Other Ambulatory Visit: Payer: Self-pay | Admitting: *Deleted

## 2016-06-04 ENCOUNTER — Non-Acute Institutional Stay (SKILLED_NURSING_FACILITY): Payer: Medicare Other | Admitting: Adult Health

## 2016-06-04 ENCOUNTER — Encounter: Payer: Self-pay | Admitting: Adult Health

## 2016-06-04 DIAGNOSIS — J438 Other emphysema: Secondary | ICD-10-CM

## 2016-06-04 DIAGNOSIS — I2510 Atherosclerotic heart disease of native coronary artery without angina pectoris: Secondary | ICD-10-CM

## 2016-06-04 DIAGNOSIS — E118 Type 2 diabetes mellitus with unspecified complications: Secondary | ICD-10-CM

## 2016-06-04 DIAGNOSIS — Z959 Presence of cardiac and vascular implant and graft, unspecified: Secondary | ICD-10-CM | POA: Diagnosis not present

## 2016-06-04 DIAGNOSIS — I272 Pulmonary hypertension, unspecified: Secondary | ICD-10-CM | POA: Diagnosis not present

## 2016-06-04 DIAGNOSIS — E782 Mixed hyperlipidemia: Secondary | ICD-10-CM

## 2016-06-04 DIAGNOSIS — I214 Non-ST elevation (NSTEMI) myocardial infarction: Secondary | ICD-10-CM | POA: Diagnosis not present

## 2016-06-04 DIAGNOSIS — Z794 Long term (current) use of insulin: Secondary | ICD-10-CM

## 2016-06-04 DIAGNOSIS — I5043 Acute on chronic combined systolic (congestive) and diastolic (congestive) heart failure: Secondary | ICD-10-CM | POA: Diagnosis not present

## 2016-06-04 DIAGNOSIS — Z95820 Peripheral vascular angioplasty status with implants and grafts: Secondary | ICD-10-CM

## 2016-06-04 DIAGNOSIS — J9611 Chronic respiratory failure with hypoxia: Secondary | ICD-10-CM | POA: Diagnosis not present

## 2016-06-04 DIAGNOSIS — N183 Chronic kidney disease, stage 3 unspecified: Secondary | ICD-10-CM

## 2016-06-04 DIAGNOSIS — Z95810 Presence of automatic (implantable) cardiac defibrillator: Secondary | ICD-10-CM

## 2016-06-04 NOTE — Progress Notes (Signed)
Patient ID: Sydney Phillips, female   DOB: [birth year], 80 y.o.   MRN: [id number]   Location:   [place] Room Number: 117-B Place of Service:  SNF (31)   CODE STATUS: DNR  Allergies  Allergen Reactions  . Potassium-Containing Compounds Other (See Comments)    Causes severe constipation  . Azithromycin Rash  . Codeine Itching  . Darvon Itching  . Erythromycin Rash  . Meloxicam Other (See Comments)    Unknown    . Norco [Hydrocodone-Acetaminophen] Itching  . Penicillins Rash    Has patient had a PCN reaction causing immediate rash, facial/tongue/throat swelling, SOB or lightheadedness with hypotension: unknown Has patient had a PCN reaction causing severe rash involving mucus membranes or skin necrosis: no Has patient had a PCN reaction that required hospitalization: unknown Has patient had a PCN reaction occurring within the last 10 years: no If all of the above answers are "NO", then may proceed with Cephalosporin use.   Marland Kitchen Propoxyphene N-Acetaminophen Itching  . Rofecoxib Other (See Comments)    Unknown    . Rosuvastatin Other (See Comments)    cramps  . Statins Itching and Other (See Comments)    Sleeplessness  . Sulfa Antibiotics Rash    Chief Complaint  Patient presents with  . Hospitalization Follow-up    Hospital Follow up    HPI:  She has been hospitalized for encephalopathy without clear etiology. She has leukocytosis, without any clear source of infection present; more than likely is from steroid use. She has combined heart failure with an EF of 25-30%. She  Is here for short term rehab at this time. There are no nursing concerns.   Past Medical History:  Diagnosis Date  . Arthritis   . Automatic implantable cardioverter-defibrillator in situ   . Bronchitis   . CAD (coronary artery disease)    Currently angina free, no evidence of reversible ischemia  . Chronic back pain   . Chronic constipation   . Colon polyps 2003.  2015.   HP polyps  2003.  adnomas 2015.  required referal to [facility] for colonoscopic resection of flat polyps.   . Congestive heart failure (CHF) (HCC)    New York Heart Association functional class 2, diastolic dysfunction  . COPD (chronic obstructive pulmonary disease) ([place])   . Dementia   . Depression with anxiety    takes Cymbalta daily  . Diabetes mellitus without complication (HCC)    no meds  . Early cataracts, bilateral   . Fibromyalgia   . GERD (gastroesophageal reflux disease)    was on meds but was taken off;now watches what she eats  . Hemorrhoids   . History of kidney stones   . Hx of colonic polyps   . Hyperlipemia    takes Crestor daily  . Hypertension    takes Amlodipine and Metoprolol daily  . Insomnia   . LBBB (left bundle branch block)    Stress test 09/03/2010, EF 55  . Myocardial infarction   . PAD (peripheral artery disease) (HCC)    Carotid, subclavian, and lower extremity beds, currently not symptomatic  . Presence of combination internal cardiac defibrillator (ICD) and pacemaker   . Presence of permanent cardiac pacemaker   . S/P angioplasty with stent, lt. subclavian 07/31/11 08/01/2011  . Shortness of breath    when over exerting self per pt.  . Subclavian arterial stenosis, lt, with PTA/STENT 07/31/11 08/01/2011  . Syncope 07/28/2011   EF - 50-55, moderate concentric hypertrophy in  left ventricle  . Urinary incontinence   . Vertigo    takes Meclizine prn    Past Surgical History:  Procedure Laterality Date  . ABDOMINAL AORTAGRAM N/A 08/12/2013   Procedure: ABDOMINAL Maxcine Ham;  Surgeon: Elam Dutch, MD;  Location: [facility] CATH LAB;  Service: Cardiovascular;  Laterality: N/A;  . ABDOMINAL HYSTERECTOMY    . APPENDECTOMY    . BACK SURGERY  2012  . BIV ICD GENERTAOR CHANGE OUT Left 02/20/2012   Procedure: BIV ICD GENERTAOR CHANGE OUT;  Surgeon: [name], MD;  Location: [facility] CATH LAB;  Service: Cardiovascular;  Laterality: Left;  . CARDIAC CATHETERIZATION  12/01/2007    By Dr. Melvern Banker, left heart cath,   . CARDIAC CATHETERIZATION N/A 05/20/2016   Procedure: Right/Left Heart Cath and Coronary Angiography;  Surgeon: Sherren Mocha, MD;  Location: [place] CV LAB;  Service: Cardiovascular;  Laterality: N/A;  . CARDIAC CATHETERIZATION N/A 05/20/2016   Procedure: Coronary Stent Intervention;  Surgeon: Sherren Mocha, MD;  Location: [place] CV LAB;  Service: Cardiovascular;  Laterality: N/A;  . CARDIAC DEFIBRILLATOR PLACEMENT  05/2008   By Dr Blanch Media, [employer] CANNOT HAVE MRI's  . CAROTID ANGIOGRAM N/A 07/31/2011   Procedure: CAROTID ANGIOGRAM;  Surgeon: Lorretta Harp, MD;  Location: [facility] CATH LAB;  Service: Cardiovascular;  Laterality: N/A;  carotid angiogram and possible Lt SCA PTA  . COLONOSCOPY W/ POLYPECTOMY  12/2013  . CORONARY ANGIOPLASTY    . ENDARTERECTOMY Left 11/08/2014   Procedure: LEFT CAROTID ENDARTERECTOMY WITH HEMASHIELD PATCH ANGIOPLASTY;  Surgeon: Elam Dutch, MD;  Location: [place];  Service: Vascular;  Laterality: Left;  . ESOPHAGOGASTRODUODENOSCOPY N/A 01/20/2014   Procedure: ESOPHAGOGASTRODUODENOSCOPY (EGD);  Surgeon: Jerene Bears, MD;  Location: [facility] ENDOSCOPY;  Service: Endoscopy;  Laterality: N/A;  . FEMORAL-POPLITEAL BYPASS GRAFT Right 10/12/2013   Procedure:   Femoral-Peroneal trunk  bypass with nonreversed greater saphenous vein graft;  Surgeon: Elam Dutch, MD;  Location: [place];  Service: Vascular;  Laterality: Right;  . GIVENS CAPSULE STUDY N/A 01/20/2014   Procedure: GIVENS CAPSULE STUDY;  Surgeon: Jerene Bears, MD;  Location: [place];  Service: Gastroenterology;  Laterality: N/A;  . INTRAOPERATIVE ARTERIOGRAM Right 10/12/2013   Procedure: INTRA OPERATIVE ARTERIOGRAM;  Surgeon: Elam Dutch, MD;  Location: [place];  Service: Vascular;  Laterality: Right;  . ORIF ELBOW FRACTURE  08/16/2011   Procedure: OPEN REDUCTION INTERNAL FIXATION (ORIF) ELBOW/OLECRANON FRACTURE;  Surgeon: Schuyler Amor, MD;  Location: [place];  Service:  Orthopedics;  Laterality: Left;  . RENAL ANGIOGRAM N/A 08/12/2013   Procedure: RENAL ANGIOGRAM;  Surgeon: Elam Dutch, MD;  Location: [facility] CATH LAB;  Service: Cardiovascular;  Laterality: N/A;  . SUBCLAVIAN STENT PLACEMENT Left 07/31/2011   7x18 Genesis, balloon, with reduction of 90% ostial left subclavian artery stenosis to 0% with residual excellent flow  . TONSILLECTOMY    . TUBAL LIGATION      Social History   Social History  . Marital status: Widowed    Spouse name: N/A  . Number of children: 3  . Years of education: N/A   Occupational History  . Not on file.   Social History Main Topics  . Smoking status: Current Every Day Smoker    Packs/day: 1.00    Years: 62.00    Types: Cigarettes  . Smokeless tobacco: Never Used     Comment: Down to 3-4 cigarettes per day  . Alcohol use No  . Drug use: No  . Sexual activity: No  Other Topics Concern  . Not on file   Social History Narrative   Ok to share information with medical POA, Son Gabriel Carina   Family History  Problem Relation Age of Onset  . CAD Father   . Heart disease Father   . Hyperlipidemia Father   . Heart disease Mother   . Deep vein thrombosis Son   . Hyperlipidemia    . Colon cancer Maternal Grandmother   . Cancer Sister     ovarian  . Diabetes Sister   . Heart disease Sister   . Anesthesia problems Neg Hx   . Hypotension Neg Hx   . Malignant hyperthermia Neg Hx   . Pseudochol deficiency Neg Hx       VITAL SIGNS BP 116/62   Pulse 81   Temp 98.4 F (36.9 C) (Oral)   Resp 17   Ht 5\' 5"  (1.651 m)   Wt 171 lb (77.6 kg)   SpO2 98%   BMI 28.46 kg/m   Patient's Medications  New Prescriptions   No medications on file  Previous Medications   ALBUTEROL (PROVENTIL HFA;VENTOLIN HFA) 108 (90 BASE) MCG/ACT INHALER    Inhale 2 puffs into the lungs every 4 (four) hours as needed for wheezing or shortness of breath.   ALLOPURINOL (ZYLOPRIM) 300 MG TABLET    Take 1 tablet (300 mg total) by mouth  daily.   ALPRAZOLAM (XANAX) 0.5 MG TABLET    Take 1 tablet (0.5 mg total) by mouth 3 (three) times daily as needed for anxiety.   ASPIRIN 81 MG CHEWABLE TABLET    Chew 1 tablet (81 mg total) by mouth daily.   ATORVASTATIN (LIPITOR) 40 MG TABLET    Take 1 tablet (40 mg total) by mouth daily at 6 PM.   BISOPROLOL (ZEBETA) 5 MG TABLET    Take 1 tablet (5 mg total) by mouth daily.   DEXTROMETHORPHAN-GUAIFENESIN (MUCINEX DM) 30-600 MG 12HR TABLET    Take 1 tablet by mouth 2 (two) times daily.   DULOXETINE (CYMBALTA) 60 MG CAPSULE    TAKE ONE CAPSULE BY MOUTH TWICE DAILY   FLUTICASONE (CLARISPRAY) 50 MCG/ACT NASAL SPRAY    Place 1 spray into both nostrils daily as needed for allergies.    FLUTICASONE (FLONASE) 50 MCG/ACT NASAL SPRAY    Place 1 spray into both nostrils daily.    HYDROCODONE-ACETAMINOPHEN (NORCO) 10-325 MG TABLET    Take one tablet by mouth every 6 hours as needed for pain. DNE 3gm of APAP from all sources/24hours   INSULIN GLARGINE (LANTUS) 100 UNIT/ML INJECTION    Inject 10 Units into the skin at bedtime.   IPRATROPIUM-ALBUTEROL (DUONEB) 0.5-2.5 (3) MG/3ML SOLN    Take 3 mLs by nebulization 3 (three) times daily.   LEVOFLOXACIN (LEVAQUIN) 750 MG TABLET    Take 750 mg by mouth every other day. Prophylaxis. Starting 05/24/16.  No stop date indicated.   MEMANTINE (NAMENDA XR) 7 MG CP24 24 HR CAPSULE    Take 14 mg by mouth daily.    NITROGLYCERIN (NITROSTAT) 0.4 MG SL TABLET    Place 1 tablet (0.4 mg total) under the tongue every 5 (five) minutes x 3 doses as needed for chest pain.   POTASSIUM CHLORIDE SA (K-DUR,KLOR-CON) 20 MEQ TABLET    Take 1 tablet (20 mEq total) by mouth daily.   TICAGRELOR (BRILINTA) 90 MG TABS TABLET    Take 1 tablet (90 mg total) by mouth 2 (two) times daily.   TORSEMIDE (DEMADEX)  20 MG TABLET    Take 20 mg by mouth 2 (two) times daily.  Modified Medications   No medications on file  Discontinued Medications   No medications on file     SIGNIFICANT DIAGNOSTIC  EXAMS  05-15-16: 2-d echo: - Left ventricle: The cavity size was normal. Systolic function was severely reduced. The estimated ejection fraction was in the range of 25% to 30%. Diffuse hypokinesis. Features are consistent with a pseudonormal left ventricular filling pattern, with  concomitant abnormal relaxation and increased filling pressure (grade 2 diastolic dysfunction). Doppler parameters are consistent with elevated ventricular end-diastolic filling pressure. - Ventricular septum: The contour showed diastolic flattening and  systolic flattening. - Aortic valve: Trileaflet; mildly thickened, mildly calcified leaflets. There was no regurgitation. - Aortic root: The aortic root was normal in size. - Mitral valve: Structurally normal valve. There was moderate to severe regurgitation. - Left atrium: The atrium was moderately dilated. - Right ventricle: The cavity size was normal. Wall thickness was normal. Systolic function was normal. - Right atrium: Pacer wire or catheter noted in right atrium. - Tricuspid valve: There was moderate regurgitation. - Pulmonic valve: Transvalvular velocity was within the normal range. There was no evidence for stenosis. - Pulmonary arteries: Systolic pressure was moderately to severely increased. PA peak pressure: 52 mm Hg (S). - Pericardium, extracardiac: There was no pericardial effusion. Impressions: - When compared to the prior study from 02/27/2016 LVEF has further  decreased, now 25-30% with diffuse hypokinesis. Elevated filling pressures.   Mitral regurgitation is at least moderate to severe.   Moderate to severe pulmonary hypertension, RVSP 52 mmHg.   05-20-16: cardiac cath: 1. Severe 2 vessel CAD of the RCA and LCx, treated successfully with drug-eluting stents in each vessel 2. Moderate ostial LAD stenosis 3. Congestive heart failure with elevated PCWP/LVEDP 4. Moderate pulmonary HTN likely related to left heart disease (PVR 2 [place] units)   05-30-16:  ct of head: No acute abnormality. Stable atrophy and chronic small vessel white matter ischemic changes.   05-30-16: chest x-ray: Cardiomegaly. No active disease.  06-03-16: ct of head and cervical spine: CT head: Atrophy with patchy small vessel disease in the supratentorial white matter bilaterally. No intracranial mass hemorrhage, or acute appearing infarct. No midline shift or extra-axial fluid. High left parietal scalp hematoma. CT cervical spine: No fracture. Minimal anterolisthesis of C4 on C5, felt to be due to underlying spondylosis. Multilevel arthropathy as summarized above. Calcification in each carotid artery. Subcentimeter left lobe thyroid nodule.  LABS REVIEWED:   05-15-16: chol 193; ldl 145; trig 116; hdl 25; hgb a1c 6.9  05-30-16: wbc 18.8; hgb 13.8; hct 41.0; mcv 90.5 ;plt 252; glucose 148; bun 26; creat 1.48; k+ 3.3; na++ 137 liver normal albumin 3.9 05-31-16: wbc 12.9; hgb 11.8; hct 36.3; mcv 93.8; plt 207; glucose 77; bun 19; creat 1.16; k+ 3.8; na++ 143     Review of Systems  Unable to perform ROS: Medical condition     Physical Exam  Constitutional: No distress.  Frail   Eyes: Conjunctivae are normal.  Neck: Neck supple. No JVD present. No thyromegaly present.  Cardiovascular: Normal rate, regular rhythm and intact distal pulses.   Murmur heard. Respiratory: Effort normal. No respiratory distress. She has no wheezes.  02 at 2L/[place] Breath sounds diminished   GI: Soft. Bowel sounds are normal. She exhibits no distension. There is no tenderness.  Musculoskeletal: She exhibits edema.  Able to move all extremities  Trace bilateral lower extremity edema  Lymphadenopathy:    She has no cervical adenopathy.  Neurological: She is alert.  Skin: Skin is warm and dry. She is not diaphoretic.  Psychiatric: She has a normal mood and affect.     ASSESSMENT/ PLAN:  1. Acute on chronic combined systolic diastolic stage C heart failure: EF 20-25% (05-15-16): status  post AICD; will continue demadex 20 mg twice daily with k+ 20 meq daily   2. CAD: Is status post NSTEMI: PCI (05-20-16): no complaint of chest pain; will continue asa 81 mg daily; brillinta 90 mg twice daily zebeta 5 mg daily has prn ntg  3. Hypertension: has pulmonary hypertension: will continue zebeta 5 mg daily   4. Gout: no recent flare: will continue allopurinol 300 mg daily   5. Dyslipidemia: ldl 145; will continue lipitor 40 mg daily   6. COPD with chronic respiratory failure with hypoxia: is 02 dependent; will continue flonase daily duoneb three times daily; mucinex DM twice daily and has albuterol 1 puffs every 4 hours as needed  7. Diabetes: hgb a1c 6.9; will continue lantus 10 units nightly   8. CKD stage III: bun 19; creat 1.16  9. Chronic back pain: will continue cymbalta 60 mg twice daily and has vicodin 10/325 mg every 6 hours as needed  10. Dementia: will continue namenda xr 7 mg daily   Will weight patient daily  Will check cbc; bmp   Time spent with patient  50   minutes >50% time spent counseling; reviewing medical record; tests; labs; and developing future plan of care   MD is aware of resident's narcotic use and is in agreement with current plan of care. We will attempt to wean resident as apropriate   Ok Edwards NP [facility]  Contact [phone] Monday through Friday 8am- 5pm  After hours call [phone]

## 2016-06-04 NOTE — Patient Outreach (Signed)
[facility]) Care Management  06/04/2016  PREET HARPHAM 08/21/1935 [id number]   Case will be closed based upon pt's placement into a SNF level of care. Will engage further with another referral based upon pt's return to her residence. Will alert pt's primary of pt's disposition with [facility].   Raina Mina, RN Care Management Coordinator [place] Office [phone]

## 2016-06-05 ENCOUNTER — Encounter: Payer: Self-pay | Admitting: *Deleted

## 2016-06-05 ENCOUNTER — Ambulatory Visit: Payer: Medicare Other | Admitting: Cardiology

## 2016-06-10 NOTE — Telephone Encounter (Signed)
Patiently currently patient at SNF.  Unsure of discharge date.

## 2016-06-13 ENCOUNTER — Encounter: Payer: Self-pay | Admitting: Adult Health

## 2016-06-13 ENCOUNTER — Non-Acute Institutional Stay (SKILLED_NURSING_FACILITY): Payer: Medicare Other | Admitting: Adult Health

## 2016-06-13 DIAGNOSIS — J9621 Acute and chronic respiratory failure with hypoxia: Secondary | ICD-10-CM | POA: Diagnosis not present

## 2016-06-13 DIAGNOSIS — I5043 Acute on chronic combined systolic (congestive) and diastolic (congestive) heart failure: Secondary | ICD-10-CM | POA: Diagnosis not present

## 2016-06-13 DIAGNOSIS — E118 Type 2 diabetes mellitus with unspecified complications: Secondary | ICD-10-CM

## 2016-06-13 DIAGNOSIS — Z794 Long term (current) use of insulin: Secondary | ICD-10-CM

## 2016-06-13 DIAGNOSIS — I214 Non-ST elevation (NSTEMI) myocardial infarction: Secondary | ICD-10-CM

## 2016-06-13 NOTE — Progress Notes (Signed)
Location:   [place] Room Number: [place] of Service:  SNF (31)    CODE STATUS: DNR  Allergies  Allergen Reactions  . Potassium-Containing Compounds Other (See Comments)    Causes severe constipation  . Azithromycin Rash  . Codeine Itching  . Darvon Itching  . Erythromycin Rash  . Meloxicam Other (See Comments)    Unknown    . Norco [Hydrocodone-Acetaminophen] Itching  . Penicillins Rash    Has patient had a PCN reaction causing immediate rash, facial/tongue/throat swelling, SOB or lightheadedness with hypotension: unknown Has patient had a PCN reaction causing severe rash involving mucus membranes or skin necrosis: no Has patient had a PCN reaction that required hospitalization: unknown Has patient had a PCN reaction occurring within the last 10 years: no If all of the above answers are "NO", then may proceed with Cephalosporin use.   Marland Kitchen Propoxyphene N-Acetaminophen Itching  . Rofecoxib Other (See Comments)    Unknown    . Rosuvastatin Other (See Comments)    cramps  . Statins Itching and Other (See Comments)    Sleeplessness  . Sulfa Antibiotics Rash    Chief Complaint  Patient presents with  . Discharge Note    HPI:  She is being discharged to home with home health for pt/ot/rn. She will not need any dme. She will need per prescriptions to be written and will need to follow up with her medical provider.    Past Medical History:  Diagnosis Date  . Arthritis   . Automatic implantable cardioverter-defibrillator in situ   . Bronchitis   . CAD (coronary artery disease)    Currently angina free, no evidence of reversible ischemia  . Chronic back pain   . Chronic constipation   . Colon polyps 2003.  2015.   HP polyps 2003.  adnomas 2015.  required referal to [facility] for colonoscopic resection of flat polyps.   . Congestive heart failure (CHF) (HCC)    New York Heart Association functional class 2, diastolic dysfunction  . COPD (chronic  obstructive pulmonary disease) ([place])   . Dementia   . Depression with anxiety    takes Cymbalta daily  . Diabetes mellitus without complication (HCC)    no meds  . Early cataracts, bilateral   . Fibromyalgia   . GERD (gastroesophageal reflux disease)    was on meds but was taken off;now watches what she eats  . Hemorrhoids   . History of kidney stones   . Hx of colonic polyps   . Hyperlipemia    takes Crestor daily  . Hypertension    takes Amlodipine and Metoprolol daily  . Insomnia   . LBBB (left bundle branch block)    Stress test 09/03/2010, EF 55  . Myocardial infarction   . PAD (peripheral artery disease) (HCC)    Carotid, subclavian, and lower extremity beds, currently not symptomatic  . Presence of combination internal cardiac defibrillator (ICD) and pacemaker   . Presence of permanent cardiac pacemaker   . S/P angioplasty with stent, lt. subclavian 07/31/11 08/01/2011  . Shortness of breath    when over exerting self per pt.  . Subclavian arterial stenosis, lt, with PTA/STENT 07/31/11 08/01/2011  . Syncope 07/28/2011   EF - 50-55, moderate concentric hypertrophy in left ventricle  . Urinary incontinence   . Vertigo    takes Meclizine prn    Past Surgical History:  Procedure Laterality Date  . ABDOMINAL AORTAGRAM N/A 08/12/2013   Procedure: ABDOMINAL AORTAGRAM;  Surgeon: Elam Dutch, MD;  Location: [facility] CATH LAB;  Service: Cardiovascular;  Laterality: N/A;  . ABDOMINAL HYSTERECTOMY    . APPENDECTOMY    . BACK SURGERY  2012  . BIV ICD GENERTAOR CHANGE OUT Left 02/20/2012   Procedure: BIV ICD GENERTAOR CHANGE OUT;  Surgeon: [name], MD;  Location: [facility] CATH LAB;  Service: Cardiovascular;  Laterality: Left;  . CARDIAC CATHETERIZATION  12/01/2007   By Dr. Melvern Banker, left heart cath,   . CARDIAC CATHETERIZATION N/A 05/20/2016   Procedure: Right/Left Heart Cath and Coronary Angiography;  Surgeon: Sherren Mocha, MD;  Location: [place] CV LAB;  Service: Cardiovascular;   Laterality: N/A;  . CARDIAC CATHETERIZATION N/A 05/20/2016   Procedure: Coronary Stent Intervention;  Surgeon: Sherren Mocha, MD;  Location: [place] CV LAB;  Service: Cardiovascular;  Laterality: N/A;  . CARDIAC DEFIBRILLATOR PLACEMENT  05/2008   By Dr Blanch Media, [employer] CANNOT HAVE MRI's  . CAROTID ANGIOGRAM N/A 07/31/2011   Procedure: CAROTID ANGIOGRAM;  Surgeon: Lorretta Harp, MD;  Location: [facility] CATH LAB;  Service: Cardiovascular;  Laterality: N/A;  carotid angiogram and possible Lt SCA PTA  . COLONOSCOPY W/ POLYPECTOMY  12/2013  . CORONARY ANGIOPLASTY    . ENDARTERECTOMY Left 11/08/2014   Procedure: LEFT CAROTID ENDARTERECTOMY WITH HEMASHIELD PATCH ANGIOPLASTY;  Surgeon: Elam Dutch, MD;  Location: [place];  Service: Vascular;  Laterality: Left;  . ESOPHAGOGASTRODUODENOSCOPY N/A 01/20/2014   Procedure: ESOPHAGOGASTRODUODENOSCOPY (EGD);  Surgeon: Jerene Bears, MD;  Location: [facility] ENDOSCOPY;  Service: Endoscopy;  Laterality: N/A;  . FEMORAL-POPLITEAL BYPASS GRAFT Right 10/12/2013   Procedure:   Femoral-Peroneal trunk  bypass with nonreversed greater saphenous vein graft;  Surgeon: Elam Dutch, MD;  Location: [place];  Service: Vascular;  Laterality: Right;  . GIVENS CAPSULE STUDY N/A 01/20/2014   Procedure: GIVENS CAPSULE STUDY;  Surgeon: Jerene Bears, MD;  Location: [place];  Service: Gastroenterology;  Laterality: N/A;  . INTRAOPERATIVE ARTERIOGRAM Right 10/12/2013   Procedure: INTRA OPERATIVE ARTERIOGRAM;  Surgeon: Elam Dutch, MD;  Location: [place];  Service: Vascular;  Laterality: Right;  . ORIF ELBOW FRACTURE  08/16/2011   Procedure: OPEN REDUCTION INTERNAL FIXATION (ORIF) ELBOW/OLECRANON FRACTURE;  Surgeon: Schuyler Amor, MD;  Location: [place];  Service: Orthopedics;  Laterality: Left;  . RENAL ANGIOGRAM N/A 08/12/2013   Procedure: RENAL ANGIOGRAM;  Surgeon: Elam Dutch, MD;  Location: [facility] CATH LAB;  Service: Cardiovascular;  Laterality: N/A;  . SUBCLAVIAN STENT  PLACEMENT Left 07/31/2011   7x18 Genesis, balloon, with reduction of 90% ostial left subclavian artery stenosis to 0% with residual excellent flow  . TONSILLECTOMY    . TUBAL LIGATION      Social History   Social History  . Marital status: Widowed    Spouse name: N/A  . Number of children: 3  . Years of education: N/A   Occupational History  . Not on file.   Social History Main Topics  . Smoking status: Current Every Day Smoker    Packs/day: 1.00    Years: 62.00    Types: Cigarettes  . Smokeless tobacco: Never Used     Comment: Down to 3-4 cigarettes per day  . Alcohol use No  . Drug use: No  . Sexual activity: No   Other Topics Concern  . Not on file   Social History Narrative   Ok to share information with medical POA, Son Gabriel Carina   Family History  Problem Relation Age of Onset  .  CAD Father   . Heart disease Father   . Hyperlipidemia Father   . Heart disease Mother   . Deep vein thrombosis Son   . Hyperlipidemia    . Colon cancer Maternal Grandmother   . Cancer Sister     ovarian  . Diabetes Sister   . Heart disease Sister   . Anesthesia problems Neg Hx   . Hypotension Neg Hx   . Malignant hyperthermia Neg Hx   . Pseudochol deficiency Neg Hx     VITAL SIGNS BP 100/62   Pulse 80   Temp 97.1 F (36.2 C)   Resp 18   Ht 5\' 5"  (1.651 m)   Wt 171 lb 9.6 oz (77.8 kg)   SpO2 98%   BMI 28.56 kg/m   Patient's Medications  New Prescriptions   No medications on file  Previous Medications   ALBUTEROL (PROVENTIL HFA;VENTOLIN HFA) 108 (90 BASE) MCG/ACT INHALER    Inhale 2 puffs into the lungs every 4 (four) hours as needed for wheezing or shortness of breath.   ALLOPURINOL (ZYLOPRIM) 300 MG TABLET    Take 1 tablet (300 mg total) by mouth daily.   ALPRAZOLAM (XANAX) 0.5 MG TABLET    Take 1 tablet (0.5 mg total) by mouth 3 (three) times daily as needed for anxiety.   ASPIRIN 81 MG CHEWABLE TABLET    Chew 1 tablet (81 mg total) by mouth daily.    ATORVASTATIN (LIPITOR) 40 MG TABLET    Take 1 tablet (40 mg total) by mouth daily at 6 PM.   BISOPROLOL (ZEBETA) 5 MG TABLET    Take 1 tablet (5 mg total) by mouth daily.   DEXTROMETHORPHAN-GUAIFENESIN (MUCINEX DM) 30-600 MG 12HR TABLET    Take 1 tablet by mouth 2 (two) times daily.   DULOXETINE (CYMBALTA) 60 MG CAPSULE    TAKE ONE CAPSULE BY MOUTH TWICE DAILY   FLUTICASONE (CLARISPRAY) 50 MCG/ACT NASAL SPRAY    Place 1 spray into both nostrils daily as needed for allergies.    FLUTICASONE (FLONASE) 50 MCG/ACT NASAL SPRAY    Place 1 spray into both nostrils daily.    HYDROCODONE-ACETAMINOPHEN (NORCO) 10-325 MG TABLET    Take one tablet by mouth every 6 hours as needed for pain. DNE 3gm of APAP from all sources/24hours   INSULIN GLARGINE (LANTUS) 100 UNIT/ML INJECTION    Inject 10 Units into the skin at bedtime.   IPRATROPIUM-ALBUTEROL (DUONEB) 0.5-2.5 (3) MG/3ML SOLN    Take 3 mLs by nebulization 3 (three) times daily.   LEVOFLOXACIN (LEVAQUIN) 750 MG TABLET    Take 750 mg by mouth every other day. Prophylaxis. Starting 05/24/16.  No stop date indicated.   MEMANTINE (NAMENDA XR) 7 MG CP24 24 HR CAPSULE    Take 14 mg by mouth daily.    NITROGLYCERIN (NITROSTAT) 0.4 MG SL TABLET    Place 1 tablet (0.4 mg total) under the tongue every 5 (five) minutes x 3 doses as needed for chest pain.   POTASSIUM CHLORIDE SA (K-DUR,KLOR-CON) 20 MEQ TABLET    Take 1 tablet (20 mEq total) by mouth daily.   TICAGRELOR (BRILINTA) 90 MG TABS TABLET    Take 1 tablet (90 mg total) by mouth 2 (two) times daily.   TORSEMIDE (DEMADEX) 20 MG TABLET    Take 20 mg by mouth 2 (two) times daily.  Modified Medications   No medications on file  Discontinued Medications   No medications on file     SIGNIFICANT  DIAGNOSTIC EXAMS   05-15-16: 2-d echo: - Left ventricle: The cavity size was normal. Systolic function was severely reduced. The estimated ejection fraction was in the range of 25% to 30%. Diffuse hypokinesis. Features  are consistent with a pseudonormal left ventricular filling pattern, with  concomitant abnormal relaxation and increased filling pressure (grade 2 diastolic dysfunction). Doppler parameters are consistent with elevated ventricular end-diastolic filling pressure. - Ventricular septum: The contour showed diastolic flattening and  systolic flattening. - Aortic valve: Trileaflet; mildly thickened, mildly calcified leaflets. There was no regurgitation. - Aortic root: The aortic root was normal in size. - Mitral valve: Structurally normal valve. There was moderate to severe regurgitation. - Left atrium: The atrium was moderately dilated. - Right ventricle: The cavity size was normal. Wall thickness was normal. Systolic function was normal. - Right atrium: Pacer wire or catheter noted in right atrium. - Tricuspid valve: There was moderate regurgitation. - Pulmonic valve: Transvalvular velocity was within the normal range. There was no evidence for stenosis. - Pulmonary arteries: Systolic pressure was moderately to severely increased. PA peak pressure: 52 mm Hg (S). - Pericardium, extracardiac: There was no pericardial effusion. Impressions: - When compared to the prior study from 02/27/2016 LVEF has further  decreased, now 25-30% with diffuse hypokinesis. Elevated filling pressures.   Mitral regurgitation is at least moderate to severe.   Moderate to severe pulmonary hypertension, RVSP 52 mmHg.   05-20-16: cardiac cath: 1. Severe 2 vessel CAD of the RCA and LCx, treated successfully with drug-eluting stents in each vessel 2. Moderate ostial LAD stenosis 3. Congestive heart failure with elevated PCWP/LVEDP 4. Moderate pulmonary HTN likely related to left heart disease (PVR 2 [place] units)   05-30-16: ct of head: No acute abnormality. Stable atrophy and chronic small vessel white matter ischemic changes.   05-30-16: chest x-ray: Cardiomegaly. No active disease.  06-03-16: ct of head and cervical spine:  CT head: Atrophy with patchy small vessel disease in the supratentorial white matter bilaterally. No intracranial mass hemorrhage, or acute appearing infarct. No midline shift or extra-axial fluid. High left parietal scalp hematoma. CT cervical spine: No fracture. Minimal anterolisthesis of C4 on C5, felt to be due to underlying spondylosis. Multilevel arthropathy as summarized above. Calcification in each carotid artery. Subcentimeter left lobe thyroid nodule.  LABS REVIEWED:   05-15-16: chol 193; ldl 145; trig 116; hdl 25; hgb a1c 6.9  05-30-16: wbc 18.8; hgb 13.8; hct 41.0; mcv 90.5 ;plt 252; glucose 148; bun 26; creat 1.48; k+ 3.3; na++ 137 liver normal albumin 3.9 05-31-16: wbc 12.9; hgb 11.8; hct 36.3; mcv 93.8; plt 207; glucose 77; bun 19; creat 1.16; k+ 3.8; na++ 143     Review of Systems  Constitutional: Negative for malaise/fatigue.  Respiratory: Negative for cough and shortness of breath.   Cardiovascular: Negative for chest pain, palpitations and leg swelling.  Gastrointestinal: Negative for abdominal pain, constipation and heartburn.  Musculoskeletal: Negative for back pain, joint pain and myalgias.  Skin: Negative.   Neurological: Negative for dizziness.  Psychiatric/Behavioral: The patient is not nervous/anxious.       Physical Exam  Constitutional: No distress.  Frail   Eyes: Conjunctivae are normal.  Neck: Neck supple. No JVD present. No thyromegaly present.  Cardiovascular: Normal rate, regular rhythm and intact distal pulses.   Murmur heard. Respiratory: Effort normal. No respiratory distress. She has no wheezes.  02 at 2L/[place] Breath sounds diminished   GI: Soft. Bowel sounds are normal. She exhibits no distension. There is no  tenderness.  Musculoskeletal: She exhibits edema.  Able to move all extremities  Trace bilateral lower extremity edema   Lymphadenopathy:    She has no cervical adenopathy.  Neurological: She is alert.  Skin: Skin is warm and dry. She  is not diaphoretic.  Psychiatric: She has a normal mood and affect.     ASSESSMENT/ PLAN:  Patient is being discharged with the following home health services:  Pt/ot/rn: to evaluate and treat as indicated for gait balance strength and adl training   Patient is being discharged with the following durable medical equipment:  None required   Patient has been advised to f/u with their PCP in 1-2 weeks to bring them up to date on their rehab stay.  Social services at facility was responsible for arranging this appointment.  Pt was provided with a 30 day supply of prescriptions for medications and refills must be obtained from their PCP.  For controlled substances, a more limited supply may be provided adequate until PCP appointment only. #10 vicodin 10/325 mg tabs; #10 xanax 0.5 mg tabs    Time spent with patient 40   minutes >50% time spent counseling; reviewing medical record; tests; labs; and developing future plan of care    Ok Edwards NP [facility]  Contact [phone] Monday through Friday 8am- 5pm  After hours call [phone]

## 2016-06-18 ENCOUNTER — Telehealth: Payer: Self-pay | Admitting: Family Medicine

## 2016-06-18 DIAGNOSIS — Z95810 Presence of automatic (implantable) cardiac defibrillator: Secondary | ICD-10-CM | POA: Diagnosis not present

## 2016-06-18 DIAGNOSIS — I255 Ischemic cardiomyopathy: Secondary | ICD-10-CM | POA: Diagnosis not present

## 2016-06-18 DIAGNOSIS — I2510 Atherosclerotic heart disease of native coronary artery without angina pectoris: Secondary | ICD-10-CM | POA: Diagnosis not present

## 2016-06-18 DIAGNOSIS — E1122 Type 2 diabetes mellitus with diabetic chronic kidney disease: Secondary | ICD-10-CM | POA: Diagnosis not present

## 2016-06-18 DIAGNOSIS — G894 Chronic pain syndrome: Secondary | ICD-10-CM | POA: Diagnosis not present

## 2016-06-18 DIAGNOSIS — N183 Chronic kidney disease, stage 3 (moderate): Secondary | ICD-10-CM | POA: Diagnosis not present

## 2016-06-18 DIAGNOSIS — I130 Hypertensive heart and chronic kidney disease with heart failure and stage 1 through stage 4 chronic kidney disease, or unspecified chronic kidney disease: Secondary | ICD-10-CM | POA: Diagnosis not present

## 2016-06-18 DIAGNOSIS — Z794 Long term (current) use of insulin: Secondary | ICD-10-CM | POA: Diagnosis not present

## 2016-06-18 DIAGNOSIS — Z955 Presence of coronary angioplasty implant and graft: Secondary | ICD-10-CM | POA: Diagnosis not present

## 2016-06-18 DIAGNOSIS — I252 Old myocardial infarction: Secondary | ICD-10-CM | POA: Diagnosis not present

## 2016-06-18 DIAGNOSIS — I5040 Unspecified combined systolic (congestive) and diastolic (congestive) heart failure: Secondary | ICD-10-CM | POA: Diagnosis not present

## 2016-06-18 DIAGNOSIS — Z7982 Long term (current) use of aspirin: Secondary | ICD-10-CM | POA: Diagnosis not present

## 2016-06-18 NOTE — Telephone Encounter (Signed)
Please okay the verbal for home health, and we will see her Friday

## 2016-06-18 NOTE — Telephone Encounter (Signed)
Sydney Phillips with [facility] home [facility] evaluated pt for home health nurse. Would like verbal for Plan of care 2 wk / 1 3 wk / 1  2 wk/ 1 1 wk / 1  For MI management and education ; Heart Failure management and education   Sydney Phillips would also like dr to know the hospital changed MOST of pt's medications. Pt has scripts for all of them, but refuses to get anything filled before she sees Dr Sarajane Jews.  Sydney Phillips states pt looks good and is stable, had a fall out of the bed last night.  Pt has appt Friday afternoon.

## 2016-06-19 ENCOUNTER — Telehealth: Payer: Self-pay

## 2016-06-19 ENCOUNTER — Ambulatory Visit (INDEPENDENT_AMBULATORY_CARE_PROVIDER_SITE_OTHER): Payer: Medicare Other

## 2016-06-19 DIAGNOSIS — I5040 Unspecified combined systolic (congestive) and diastolic (congestive) heart failure: Secondary | ICD-10-CM | POA: Diagnosis not present

## 2016-06-19 DIAGNOSIS — Z95810 Presence of automatic (implantable) cardiac defibrillator: Secondary | ICD-10-CM | POA: Diagnosis not present

## 2016-06-19 DIAGNOSIS — I130 Hypertensive heart and chronic kidney disease with heart failure and stage 1 through stage 4 chronic kidney disease, or unspecified chronic kidney disease: Secondary | ICD-10-CM | POA: Diagnosis not present

## 2016-06-19 DIAGNOSIS — I2510 Atherosclerotic heart disease of native coronary artery without angina pectoris: Secondary | ICD-10-CM | POA: Diagnosis not present

## 2016-06-19 DIAGNOSIS — Z7982 Long term (current) use of aspirin: Secondary | ICD-10-CM | POA: Diagnosis not present

## 2016-06-19 DIAGNOSIS — I252 Old myocardial infarction: Secondary | ICD-10-CM | POA: Diagnosis not present

## 2016-06-19 DIAGNOSIS — N183 Chronic kidney disease, stage 3 (moderate): Secondary | ICD-10-CM | POA: Diagnosis not present

## 2016-06-19 DIAGNOSIS — G894 Chronic pain syndrome: Secondary | ICD-10-CM | POA: Diagnosis not present

## 2016-06-19 DIAGNOSIS — Z955 Presence of coronary angioplasty implant and graft: Secondary | ICD-10-CM | POA: Diagnosis not present

## 2016-06-19 DIAGNOSIS — I255 Ischemic cardiomyopathy: Secondary | ICD-10-CM

## 2016-06-19 DIAGNOSIS — Z794 Long term (current) use of insulin: Secondary | ICD-10-CM | POA: Diagnosis not present

## 2016-06-19 DIAGNOSIS — E1122 Type 2 diabetes mellitus with diabetic chronic kidney disease: Secondary | ICD-10-CM | POA: Diagnosis not present

## 2016-06-19 NOTE — Telephone Encounter (Signed)
Spoke with patient.. See ICM note 

## 2016-06-19 NOTE — Progress Notes (Signed)
EPIC Encounter for ICM Monitoring  Patient Name: Sydney Phillips is a 81 y.o. female Date: 06/19/2016 Primary Care Physican: Alysia Penna, MD Primary Cardiologist: Croitoru Electrophysiologist: Croitoru Dry Weight: unknown Bi-V Pacing:  96.5%       Attempted call to patient for ICM intro and transmission review. Left voice mail message to return call.  Will attempt another call back today.    Call to son, [name] and provided intro.  He stated patient is probably still sleeping but she was discharged home.  Advised the transmission received today looks like she has significant fluid accumulation.  He will go by her house after he gets off work at 3 pm and have her call me.  Advised name and number are on her answering machine.     Thoracic impedance abnormal suggesting fluid accumulation.  Fluid index > 200 since 04/19/2016.  Labs: 05/31/2016 Creatinine 1.16, BUN 19, Potassium 3.8, Sodium 143, EGFR 44-51 05/30/2016 Creatinine 1.48, BUN 26, Potassium 3.3, Sodium 137, EGFR 33-38  05/29/2016 Creatinine 1.48, BUN 27, Potassium 3.6, Sodium 140  05/23/2016 Creatinine 1.21, BUN 27, Potassium 3.4, Sodium 140, EGFR 42-48 05/22/2016 Creatinine 1.27, BUN 24, Potassium 3.6, Sodium 141, EGFR 39-46  05/21/2016 Creatinine 1.42, BUN 32, Potassium 4.0, Sodium 143, EGFR 34-40  05/20/2016 Creatinine 1.45, BUN 43, Potassium 4.1, Sodium 139, EGFR 39-46  05/19/2016 Creatinine 1.46, BUN 49, Potassium 3.9, Sodium 140, EGFR 34-39  05/18/2016 Creatinine 1.70, BUN 55, Potassium 4.2, Sodium 139, EGFR 33-39  05/17/2016 Creatinine 1.84, BUN 62, Potassium 3.6, Sodium 139, EGFR 25-29  05/16/2017 Creatinine 1.76, BUN 38, Potassium 4.1, Sodium 140, EGFR 27-31 05/15/2016 Creatinine 1.34, BUN 20, Potassium 3.3, Sodium 137, EGFR 37-43  05/14/2016 Creatinine 1.64, BUN 24, Potassium 2.6, Sodium 135, EGFR 29-33  04/16/2016 Creatinine 1.92, BUN 58, Potassium 3.9, Sodium 135  04/11/2016 Creatinine 1.46, BUN 57, Potassium  3.6, Sodium 136, EGFR 33-39  04/10/2016 Creatinine 1.46, BUN 52, Potassium 4.2, Sodium 138, EGFR 33-39  04/09/2016 Creatinine 1.59, BUN 47, Potassium 4.4, Sodium 140, EGFR 30-35    Recommendations:  NONE - Unable to reach but will attempt call back.   Meds listed are Torsemide 20 mg 1 tablet bid and Potassium 20 mEq 1 tablet daily.  Will need to verify with patient when reached she is taking these dosages.   Follow-up plan: ICM clinic phone appointment on 06/25/2015 to recheck fluid levels.  Patient has appointment with Dr Sarajane Jews on 06/20/2016.    Copy of ICM check sent to  PCP and primary cardiologist/device physician for review.   3 month ICM trend : 06/19/2016   1 Year ICM trend:      [name], RN 06/19/2016 10:56 AM

## 2016-06-19 NOTE — Progress Notes (Signed)
Yes, let's wait for Dr. Sarajane Jews to assess her. MCr

## 2016-06-19 NOTE — Telephone Encounter (Signed)
Call to son, [name] [facility]) and ICM intro given.  He stated patient is probably still sleeping.  He said he gets off work at 3 pm and will go by her house to wake her up and call me.  Explained my name and number are on patients voice mail

## 2016-06-19 NOTE — Telephone Encounter (Signed)
Remote ICM transmission received.  Attempted patient call for ICM intro and review of transmission.  Left message to return call.

## 2016-06-19 NOTE — Progress Notes (Signed)
Patient returned call and provided ICM intro.  She agreed to monthly calls.  She was discharged from rehab on 06/13/2016 and has fallen every day since discharge.    FLUID: She is symptomatic and has gained 5 lbs since rehab discharge on 12/29. Weight on day of discharge was 169 lbs and yesterday she was 174 lbs.  She denied any difficulty breathing or extremity swelling.    MEDS: She has not taken any medications since being home on 12/29.  She read the list of prescriptions she has but has not filled and it included Demadex 20 mg bid and no prescription for potassium.  She wants Dr Sarajane Jews to review all her meds at the appt tomorrow, 06/20/2016, and then will get prescriptions filled.  She stated hospital changed a lot of her meds.       Explained the transmission from her device suggests she has a lot of fluid accumulation.    Advised would recheck fluid levels on 06/24/2016 and the importance of taking her medications.  She reported home health nurse was there yesterday.     Advised I would send copy of note to Dr Sallyanne Kuster and Dr Sarajane Jews for review and if there are any recommendations today from Dr Sallyanne Kuster I will call her back.  Advised Dr Sallyanne Kuster may wait until Dr Sarajane Jews evaluates her tomorrow and then she can fill the scripts that he orders.

## 2016-06-19 NOTE — Telephone Encounter (Signed)
Left detailed message on Sydney Phillips's personal voicemail, orders given for Home Health Nurse 2 wk / 1,3 wk /1, 2 wk/ 1, 1 wk/ 1 okay per Dr. Sarajane Jews. Any questions please call office.

## 2016-06-20 ENCOUNTER — Telehealth: Payer: Self-pay | Admitting: Family Medicine

## 2016-06-20 ENCOUNTER — Encounter: Payer: Self-pay | Admitting: Family Medicine

## 2016-06-20 ENCOUNTER — Ambulatory Visit (INDEPENDENT_AMBULATORY_CARE_PROVIDER_SITE_OTHER): Payer: Medicare Other | Admitting: Family Medicine

## 2016-06-20 VITALS — BP 174/100 | HR 108 | Ht 65.0 in | Wt 163.0 lb

## 2016-06-20 DIAGNOSIS — G9340 Encephalopathy, unspecified: Secondary | ICD-10-CM

## 2016-06-20 DIAGNOSIS — I5043 Acute on chronic combined systolic (congestive) and diastolic (congestive) heart failure: Secondary | ICD-10-CM | POA: Diagnosis not present

## 2016-06-20 DIAGNOSIS — I255 Ischemic cardiomyopathy: Secondary | ICD-10-CM

## 2016-06-20 DIAGNOSIS — N170 Acute kidney failure with tubular necrosis: Secondary | ICD-10-CM

## 2016-06-20 DIAGNOSIS — Y92009 Unspecified place in unspecified non-institutional (private) residence as the place of occurrence of the external cause: Secondary | ICD-10-CM

## 2016-06-20 DIAGNOSIS — N183 Chronic kidney disease, stage 3 unspecified: Secondary | ICD-10-CM

## 2016-06-20 DIAGNOSIS — F418 Other specified anxiety disorders: Secondary | ICD-10-CM

## 2016-06-20 DIAGNOSIS — I10 Essential (primary) hypertension: Secondary | ICD-10-CM

## 2016-06-20 DIAGNOSIS — W19XXXD Unspecified fall, subsequent encounter: Secondary | ICD-10-CM

## 2016-06-20 DIAGNOSIS — E118 Type 2 diabetes mellitus with unspecified complications: Secondary | ICD-10-CM

## 2016-06-20 DIAGNOSIS — I272 Pulmonary hypertension, unspecified: Secondary | ICD-10-CM

## 2016-06-20 DIAGNOSIS — Y92099 Unspecified place in other non-institutional residence as the place of occurrence of the external cause: Secondary | ICD-10-CM

## 2016-06-20 LAB — CBC WITH DIFFERENTIAL/PLATELET
BASOS PCT: 0.2 % (ref 0.0–3.0)
Basophils Absolute: 0 10*3/uL (ref 0.0–0.1)
EOS ABS: 0.2 10*3/uL (ref 0.0–0.7)
Eosinophils Relative: 2.8 % (ref 0.0–5.0)
HEMATOCRIT: 42 % (ref 36.0–46.0)
Hemoglobin: 13.5 g/dL (ref 12.0–15.0)
LYMPHS PCT: 21.9 % (ref 12.0–46.0)
Lymphs Abs: 1.6 10*3/uL (ref 0.7–4.0)
MCHC: 32.2 g/dL (ref 30.0–36.0)
MCV: 91.9 fl (ref 78.0–100.0)
Monocytes Absolute: 0.8 10*3/uL (ref 0.1–1.0)
Monocytes Relative: 10.5 % (ref 3.0–12.0)
NEUTROS ABS: 4.6 10*3/uL (ref 1.4–7.7)
Neutrophils Relative %: 64.6 % (ref 43.0–77.0)
PLATELETS: 311 10*3/uL (ref 150.0–400.0)
RBC: 4.57 Mil/uL (ref 3.87–5.11)
RDW: 16.8 % — AB (ref 11.5–15.5)
WBC: 7.1 10*3/uL (ref 4.0–10.5)

## 2016-06-20 LAB — BASIC METABOLIC PANEL
BUN: 7 mg/dL (ref 6–23)
CHLORIDE: 105 meq/L (ref 96–112)
CO2: 31 meq/L (ref 19–32)
CREATININE: 0.83 mg/dL (ref 0.40–1.20)
Calcium: 9.2 mg/dL (ref 8.4–10.5)
GFR: 70.59 mL/min (ref >60.00)
Glucose, Bld: 182 mg/dL — ABNORMAL HIGH (ref 70–99)
Potassium: 3.8 mEq/L (ref 3.5–5.1)
Sodium: 144 mEq/L (ref 135–145)

## 2016-06-20 MED ORDER — ALPRAZOLAM 0.5 MG PO TABS: 1.0000 mg | ORAL_TABLET | Freq: Every evening | ORAL | 5 refills | Status: DC | PRN

## 2016-06-20 MED ORDER — ATORVASTATIN CALCIUM 40 MG PO TABS: 40.0000 mg | ORAL_TABLET | Freq: Every day | ORAL | 3 refills | Status: DC

## 2016-06-20 MED ORDER — BISOPROLOL FUMARATE 5 MG PO TABS: 5.0000 mg | ORAL_TABLET | Freq: Every day | ORAL | 3 refills | Status: DC

## 2016-06-20 MED ORDER — POTASSIUM CHLORIDE CRYS ER 20 MEQ PO TBCR: 20.0000 meq | EXTENDED_RELEASE_TABLET | Freq: Every day | ORAL | 3 refills | Status: DC

## 2016-06-20 NOTE — Progress Notes (Signed)
Pre visit review using our clinic review tool, if applicable. No additional management support is needed unless otherwise documented below in the visit note. 

## 2016-06-20 NOTE — Progress Notes (Signed)
   Subjective:    Patient ID: Sydney Phillips, female    DOB: 1935-11-05, 81 y.o.   MRN: [id number]  HPI Here to follow up a hospital stay from 05-30-16 to 05-31-16 and then a short rehab stay at [facility]. She had been treated for psychotic symptoms including visual and auditory hallucinations. At the same time she had persistent leukocytosis with WBC counts in the 20s. No source of infection was found, so this was felt to be from extended steroid use. After the steroids were stopped her psychosis resolved and the WBC decreased to 12.9 on 05-31-16. She now feels fine except for her tendency to fall. She has been home the past 5 days and she has fallen 3 times. Each time this occurs when she turns around quickly and loses her balance. No significant injuries. She has a walker but has not used it consistently. She had been written to start on Namenda, but she has not filled this yet. She feels she does not need this, and talking to her son he does not think she needs it either. She is back to her baseline as far as mental status.    Review of Systems  Respiratory: Negative.   Cardiovascular: Negative.   Gastrointestinal: Negative.   Genitourinary: Negative.   Neurological: Positive for dizziness and weakness. Negative for tremors, seizures, syncope, facial asymmetry, speech difficulty, light-headedness, numbness and headaches.  Psychiatric/Behavioral: Negative.        Objective:   Physical Exam  Constitutional: She is oriented to person, place, and time.  Using her walker, alert   Neck: No thyromegaly present.  Cardiovascular: Normal rate, regular rhythm, normal heart sounds and intact distal pulses.   Pulmonary/Chest: Effort normal and breath sounds normal.  Musculoskeletal: She exhibits no edema.  Lymphadenopathy:    She has no cervical adenopathy.  Neurological: She is alert and oriented to person, place, and time. No cranial nerve deficit. She exhibits normal muscle tone.    Psychiatric: She has a normal mood and affect. Her behavior is normal. Judgment and thought content normal.          Assessment & Plan:  She seems to be back to baseline except for a balance problem and a tendency to fall. I urged her to use her walker at all times. She is still getting PT at home for a few more weeks. Her psychosis has resolved. Her mental status is back to baseline and we all agreed that she does not need to take Namenda at this time. Her leukocytosis was apparently from steroid use which has been stopped. We wil recheck a CBC today. Her renal status seems to be stable but we will check a BMET today. Her CHF is stable.  Alysia Penna, MD

## 2016-06-20 NOTE — Telephone Encounter (Signed)
Sharyn Lull with [facility] home [facility] would like to move pt's OT eval to next week. Pt has a lot going on today.

## 2016-06-20 NOTE — Telephone Encounter (Signed)
I spoke with Shanon Brow and gave below information.

## 2016-06-20 NOTE — Telephone Encounter (Signed)
I spoke with Sharyn Lull and gave below information.

## 2016-06-20 NOTE — Telephone Encounter (Signed)
Shanon Brow with [facility] PT  Calling to request verbal orders to continue home health PT 1 wk/ 1 2 wk / 3 1 wk /1  Also david reports pt has told him she has had a fall everyday since she has been home from the hospital.

## 2016-06-20 NOTE — Telephone Encounter (Signed)
Okay to continue PT.

## 2016-06-20 NOTE — Telephone Encounter (Signed)
Okay to postpone OT

## 2016-06-23 ENCOUNTER — Telehealth: Payer: Self-pay | Admitting: Family Medicine

## 2016-06-23 NOTE — Telephone Encounter (Signed)
I understand. Please try the PT again.

## 2016-06-23 NOTE — Telephone Encounter (Signed)
Sydney [facility] with [facility] would like Dr Sarajane Jews to know he went by to see the pt today, but pt was in the bed. Unable to see the pt.  Son states pt too weak and tired. Son did advise [facility] that pt continues to fall everyday, and had 2 falls today.

## 2016-06-24 ENCOUNTER — Telehealth: Payer: Self-pay | Admitting: Cardiology

## 2016-06-24 NOTE — Telephone Encounter (Signed)
Spoke to Shanon Brow, told him Dr. Sarajane Jews would like you to try Physical Therapy again with pt. Shanon Brow verbalized understanding and stated he has a follow up later in the week with pt. Told him okay.

## 2016-06-24 NOTE — Telephone Encounter (Signed)
LMOVM reminding pt to send remote transmission.   

## 2016-06-25 ENCOUNTER — Telehealth: Payer: Self-pay | Admitting: Family Medicine

## 2016-06-25 NOTE — Telephone Encounter (Signed)
danielle with [facility] states pt has refused to see anyone from [place], nurse visits,  PT or OT. The son is catching them at the door or phone calls and stating it is not a good day.  Son has not returned any of their calls, and will not let anyone come.

## 2016-06-26 ENCOUNTER — Emergency Department (HOSPITAL_COMMUNITY): Payer: Medicare Other

## 2016-06-26 ENCOUNTER — Encounter (HOSPITAL_COMMUNITY): Payer: Self-pay | Admitting: Emergency Medicine

## 2016-06-26 ENCOUNTER — Inpatient Hospital Stay (HOSPITAL_COMMUNITY)
Admission: EM | Admit: 2016-06-26 | Discharge: 2016-06-30 | DRG: 291 | Disposition: A | Discharge status: Home Health | Payer: Medicare Other | Attending: Internal Medicine | Admitting: Internal Medicine

## 2016-06-26 DIAGNOSIS — N183 Chronic kidney disease, stage 3 unspecified: Secondary | ICD-10-CM | POA: Diagnosis present

## 2016-06-26 DIAGNOSIS — I340 Nonrheumatic mitral (valve) insufficiency: Secondary | ICD-10-CM | POA: Diagnosis not present

## 2016-06-26 DIAGNOSIS — F17210 Nicotine dependence, cigarettes, uncomplicated: Secondary | ICD-10-CM | POA: Diagnosis present

## 2016-06-26 DIAGNOSIS — M533 Sacrococcygeal disorders, not elsewhere classified: Secondary | ICD-10-CM | POA: Diagnosis not present

## 2016-06-26 DIAGNOSIS — M25559 Pain in unspecified hip: Secondary | ICD-10-CM | POA: Diagnosis not present

## 2016-06-26 DIAGNOSIS — Z888 Allergy status to other drugs, medicaments and biological substances status: Secondary | ICD-10-CM

## 2016-06-26 DIAGNOSIS — F418 Other specified anxiety disorders: Secondary | ICD-10-CM | POA: Diagnosis present

## 2016-06-26 DIAGNOSIS — S79912A Unspecified injury of left hip, initial encounter: Secondary | ICD-10-CM | POA: Diagnosis not present

## 2016-06-26 DIAGNOSIS — Z9861 Coronary angioplasty status: Secondary | ICD-10-CM

## 2016-06-26 DIAGNOSIS — G8929 Other chronic pain: Secondary | ICD-10-CM | POA: Diagnosis present

## 2016-06-26 DIAGNOSIS — F419 Anxiety disorder, unspecified: Secondary | ICD-10-CM | POA: Diagnosis present

## 2016-06-26 DIAGNOSIS — Z66 Do not resuscitate: Secondary | ICD-10-CM | POA: Diagnosis present

## 2016-06-26 DIAGNOSIS — F0390 Unspecified dementia without behavioral disturbance: Secondary | ICD-10-CM | POA: Diagnosis present

## 2016-06-26 DIAGNOSIS — E119 Type 2 diabetes mellitus without complications: Secondary | ICD-10-CM

## 2016-06-26 DIAGNOSIS — E872 Acidosis: Secondary | ICD-10-CM | POA: Diagnosis not present

## 2016-06-26 DIAGNOSIS — G4733 Obstructive sleep apnea (adult) (pediatric): Secondary | ICD-10-CM | POA: Diagnosis present

## 2016-06-26 DIAGNOSIS — I130 Hypertensive heart and chronic kidney disease with heart failure and stage 1 through stage 4 chronic kidney disease, or unspecified chronic kidney disease: Principal | ICD-10-CM | POA: Diagnosis present

## 2016-06-26 DIAGNOSIS — J449 Chronic obstructive pulmonary disease, unspecified: Secondary | ICD-10-CM | POA: Diagnosis not present

## 2016-06-26 DIAGNOSIS — R0602 Shortness of breath: Secondary | ICD-10-CM | POA: Diagnosis not present

## 2016-06-26 DIAGNOSIS — K219 Gastro-esophageal reflux disease without esophagitis: Secondary | ICD-10-CM | POA: Diagnosis present

## 2016-06-26 DIAGNOSIS — Z880 Allergy status to penicillin: Secondary | ICD-10-CM

## 2016-06-26 DIAGNOSIS — I2510 Atherosclerotic heart disease of native coronary artery without angina pectoris: Secondary | ICD-10-CM | POA: Diagnosis not present

## 2016-06-26 DIAGNOSIS — I10 Essential (primary) hypertension: Secondary | ICD-10-CM | POA: Diagnosis present

## 2016-06-26 DIAGNOSIS — M549 Dorsalgia, unspecified: Secondary | ICD-10-CM | POA: Diagnosis present

## 2016-06-26 DIAGNOSIS — J9621 Acute and chronic respiratory failure with hypoxia: Secondary | ICD-10-CM | POA: Diagnosis not present

## 2016-06-26 DIAGNOSIS — E1151 Type 2 diabetes mellitus with diabetic peripheral angiopathy without gangrene: Secondary | ICD-10-CM | POA: Diagnosis not present

## 2016-06-26 DIAGNOSIS — I5023 Acute on chronic systolic (congestive) heart failure: Secondary | ICD-10-CM

## 2016-06-26 DIAGNOSIS — I248 Other forms of acute ischemic heart disease: Secondary | ICD-10-CM | POA: Diagnosis not present

## 2016-06-26 DIAGNOSIS — S79911A Unspecified injury of right hip, initial encounter: Secondary | ICD-10-CM | POA: Diagnosis not present

## 2016-06-26 DIAGNOSIS — J9611 Chronic respiratory failure with hypoxia: Secondary | ICD-10-CM

## 2016-06-26 DIAGNOSIS — Z885 Allergy status to narcotic agent status: Secondary | ICD-10-CM

## 2016-06-26 DIAGNOSIS — R748 Abnormal levels of other serum enzymes: Secondary | ICD-10-CM

## 2016-06-26 DIAGNOSIS — I429 Cardiomyopathy, unspecified: Secondary | ICD-10-CM | POA: Diagnosis not present

## 2016-06-26 DIAGNOSIS — Z90710 Acquired absence of both cervix and uterus: Secondary | ICD-10-CM

## 2016-06-26 DIAGNOSIS — F329 Major depressive disorder, single episode, unspecified: Secondary | ICD-10-CM | POA: Diagnosis present

## 2016-06-26 DIAGNOSIS — Z7982 Long term (current) use of aspirin: Secondary | ICD-10-CM

## 2016-06-26 DIAGNOSIS — M109 Gout, unspecified: Secondary | ICD-10-CM | POA: Diagnosis present

## 2016-06-26 DIAGNOSIS — E1122 Type 2 diabetes mellitus with diabetic chronic kidney disease: Secondary | ICD-10-CM | POA: Diagnosis present

## 2016-06-26 DIAGNOSIS — I252 Old myocardial infarction: Secondary | ICD-10-CM

## 2016-06-26 DIAGNOSIS — M25551 Pain in right hip: Secondary | ICD-10-CM | POA: Diagnosis not present

## 2016-06-26 DIAGNOSIS — R7989 Other specified abnormal findings of blood chemistry: Secondary | ICD-10-CM | POA: Diagnosis not present

## 2016-06-26 DIAGNOSIS — E785 Hyperlipidemia, unspecified: Secondary | ICD-10-CM | POA: Diagnosis present

## 2016-06-26 DIAGNOSIS — R296 Repeated falls: Secondary | ICD-10-CM | POA: Diagnosis present

## 2016-06-26 DIAGNOSIS — Z8249 Family history of ischemic heart disease and other diseases of the circulatory system: Secondary | ICD-10-CM

## 2016-06-26 DIAGNOSIS — M25552 Pain in left hip: Secondary | ICD-10-CM | POA: Diagnosis not present

## 2016-06-26 DIAGNOSIS — R778 Other specified abnormalities of plasma proteins: Secondary | ICD-10-CM

## 2016-06-26 DIAGNOSIS — I272 Pulmonary hypertension, unspecified: Secondary | ICD-10-CM | POA: Diagnosis present

## 2016-06-26 DIAGNOSIS — I110 Hypertensive heart disease with heart failure: Secondary | ICD-10-CM | POA: Diagnosis not present

## 2016-06-26 DIAGNOSIS — E118 Type 2 diabetes mellitus with unspecified complications: Secondary | ICD-10-CM

## 2016-06-26 DIAGNOSIS — Z7902 Long term (current) use of antithrombotics/antiplatelets: Secondary | ICD-10-CM

## 2016-06-26 DIAGNOSIS — Z95810 Presence of automatic (implantable) cardiac defibrillator: Secondary | ICD-10-CM | POA: Diagnosis present

## 2016-06-26 DIAGNOSIS — I5043 Acute on chronic combined systolic (congestive) and diastolic (congestive) heart failure: Secondary | ICD-10-CM | POA: Diagnosis not present

## 2016-06-26 DIAGNOSIS — Z955 Presence of coronary angioplasty implant and graft: Secondary | ICD-10-CM

## 2016-06-26 DIAGNOSIS — Z79899 Other long term (current) drug therapy: Secondary | ICD-10-CM

## 2016-06-26 DIAGNOSIS — Z883 Allergy status to other anti-infective agents status: Secondary | ICD-10-CM

## 2016-06-26 DIAGNOSIS — I509 Heart failure, unspecified: Secondary | ICD-10-CM | POA: Diagnosis not present

## 2016-06-26 HISTORY — DX: Pulmonary hypertension, unspecified: I27.20

## 2016-06-26 HISTORY — DX: Other chronic pain: G89.29

## 2016-06-26 HISTORY — DX: Chronic kidney disease, stage 3 unspecified: N18.30

## 2016-06-26 HISTORY — DX: Cardiomyopathy, unspecified: I42.9

## 2016-06-26 HISTORY — DX: Chronic kidney disease, stage 3 (moderate): N18.3

## 2016-06-26 HISTORY — DX: Type 2 diabetes mellitus without complications: E11.9

## 2016-06-26 HISTORY — DX: Chronic combined systolic (congestive) and diastolic (congestive) heart failure: I50.42

## 2016-06-26 LAB — COMPREHENSIVE METABOLIC PANEL
ALBUMIN: 4 g/dL (ref 3.5–5.0)
ALT: 11 U/L — ABNORMAL LOW (ref 14–54)
ANION GAP: 11 (ref 5–15)
AST: 17 U/L (ref 15–41)
Alkaline Phosphatase: 140 U/L — ABNORMAL HIGH (ref 38–126)
BILIRUBIN TOTAL: 1.5 mg/dL — AB (ref 0.3–1.2)
BUN: 6 mg/dL (ref 6–20)
CHLORIDE: 105 mmol/L (ref 101–111)
CO2: 26 mmol/L (ref 22–32)
Calcium: 9.2 mg/dL (ref 8.9–10.3)
Creatinine, Ser: 0.86 mg/dL (ref 0.44–1.00)
GFR calc Af Amer: >60 mL/min (ref >60)
GFR calc non Af Amer: >60 mL/min (ref >60)
GLUCOSE: 134 mg/dL — AB (ref 65–99)
Potassium: 3.4 mmol/L — ABNORMAL LOW (ref 3.5–5.1)
Sodium: 142 mmol/L (ref 135–145)
TOTAL PROTEIN: 6.9 g/dL (ref 6.5–8.1)

## 2016-06-26 LAB — BLOOD GAS, VENOUS
Acid-Base Excess: 1.9 mmol/L (ref 0.0–2.0)
Bicarbonate: 26 mmol/L (ref 20.0–28.0)
O2 Saturation: 49.7 %
PCO2 VEN: 40.4 mmHg — AB (ref 44.0–60.0)
PH VEN: 7.424 (ref 7.250–7.430)
Patient temperature: 98.6

## 2016-06-26 LAB — CBC WITH DIFFERENTIAL/PLATELET
BASOS ABS: 0 10*3/uL (ref 0.0–0.1)
BASOS PCT: 0 %
EOS ABS: 0.1 10*3/uL (ref 0.0–0.7)
Eosinophils Relative: 2 %
HEMATOCRIT: 45 % (ref 36.0–46.0)
Hemoglobin: 14.4 g/dL (ref 12.0–15.0)
Lymphocytes Relative: 18 %
Lymphs Abs: 1.6 10*3/uL (ref 0.7–4.0)
MCH: 28.9 pg (ref 26.0–34.0)
MCHC: 32 g/dL (ref 30.0–36.0)
MCV: 90.4 fL (ref 78.0–100.0)
MONO ABS: 1 10*3/uL (ref 0.1–1.0)
MONOS PCT: 11 %
NEUTROS ABS: 5.9 10*3/uL (ref 1.7–7.7)
Neutrophils Relative %: 69 %
PLATELETS: 431 10*3/uL — AB (ref 150–400)
RBC: 4.98 MIL/uL (ref 3.87–5.11)
RDW: 16.1 % — AB (ref 11.5–15.5)
WBC: 8.7 10*3/uL (ref 4.0–10.5)

## 2016-06-26 LAB — URINALYSIS, ROUTINE W REFLEX MICROSCOPIC
BACTERIA UA: NONE SEEN
Bilirubin Urine: NEGATIVE
Glucose, UA: NEGATIVE mg/dL
Hgb urine dipstick: NEGATIVE
KETONES UR: NEGATIVE mg/dL
LEUKOCYTES UA: NEGATIVE
NITRITE: NEGATIVE
PH: 7 (ref 5.0–8.0)
PROTEIN: 30 mg/dL — AB
SQUAMOUS EPITHELIAL / LPF: NONE SEEN
Specific Gravity, Urine: 1.008 (ref 1.005–1.030)

## 2016-06-26 LAB — TROPONIN I
TROPONIN I: 0.12 ng/mL — AB (ref <0.03)
Troponin I: 0.09 ng/mL (ref <0.03)

## 2016-06-26 LAB — I-STAT CG4 LACTIC ACID, ED: LACTIC ACID, VENOUS: 2.34 mmol/L — AB (ref 0.5–1.9)

## 2016-06-26 LAB — BRAIN NATRIURETIC PEPTIDE: B NATRIURETIC PEPTIDE 5: 963.2 pg/mL — AB (ref 0.0–100.0)

## 2016-06-26 MED ORDER — BISOPROLOL FUMARATE 5 MG PO TABS
5.0000 mg | ORAL_TABLET | Freq: Every day | ORAL | Status: DC
  Administered 2016-06-26 – 2016-06-30 (×5): 5 mg via ORAL
  Filled 2016-06-26 (×5): qty 1

## 2016-06-26 MED ORDER — FLUTICASONE PROPIONATE 50 MCG/ACT NA SUSP
1.0000 | Freq: Every day | NASAL | Status: DC | PRN
  Filled 2016-06-26: qty 16

## 2016-06-26 MED ORDER — ASPIRIN EC 81 MG PO TBEC: 81.0000 mg | DELAYED_RELEASE_TABLET | Freq: Every day | ORAL | Status: DC

## 2016-06-26 MED ORDER — SODIUM CHLORIDE 0.9% FLUSH: 3.0000 mL | INTRAVENOUS | Status: DC | PRN

## 2016-06-26 MED ORDER — ACETAMINOPHEN 325 MG PO TABS
650.0000 mg | ORAL_TABLET | ORAL | Status: DC | PRN
  Administered 2016-06-26 – 2016-06-29 (×3): 650 mg via ORAL
  Filled 2016-06-26 (×4): qty 2

## 2016-06-26 MED ORDER — POTASSIUM CHLORIDE CRYS ER 20 MEQ PO TBCR
20.0000 meq | EXTENDED_RELEASE_TABLET | Freq: Two times a day (BID) | ORAL | Status: DC
  Administered 2016-06-26 – 2016-06-29 (×7): 20 meq via ORAL
  Filled 2016-06-26 (×8): qty 1

## 2016-06-26 MED ORDER — LISINOPRIL 10 MG PO TABS
5.0000 mg | ORAL_TABLET | Freq: Every day | ORAL | Status: DC
  Administered 2016-06-26 – 2016-06-30 (×5): 5 mg via ORAL
  Filled 2016-06-26 (×5): qty 1

## 2016-06-26 MED ORDER — ALLOPURINOL 300 MG PO TABS
300.0000 mg | ORAL_TABLET | Freq: Every day | ORAL | Status: DC
  Administered 2016-06-26 – 2016-06-30 (×5): 300 mg via ORAL
  Filled 2016-06-26 (×5): qty 1

## 2016-06-26 MED ORDER — ALPRAZOLAM 1 MG PO TABS
1.0000 mg | ORAL_TABLET | Freq: Every evening | ORAL | Status: DC | PRN
  Administered 2016-06-26 – 2016-06-29 (×4): 1 mg via ORAL
  Filled 2016-06-26 (×2): qty 1
  Filled 2016-06-26: qty 2
  Filled 2016-06-26: qty 1

## 2016-06-26 MED ORDER — ASPIRIN EC 81 MG PO TBEC
81.0000 mg | DELAYED_RELEASE_TABLET | Freq: Every day | ORAL | Status: DC
  Filled 2016-06-26: qty 1

## 2016-06-26 MED ORDER — POTASSIUM CHLORIDE CRYS ER 20 MEQ PO TBCR
40.0000 meq | EXTENDED_RELEASE_TABLET | Freq: Once | ORAL | Status: AC
  Administered 2016-06-26: 40 meq via ORAL
  Filled 2016-06-26: qty 2

## 2016-06-26 MED ORDER — FENTANYL CITRATE (PF) 100 MCG/2ML IJ SOLN
50.0000 ug | Freq: Once | INTRAMUSCULAR | Status: AC
  Administered 2016-06-26: 50 ug via INTRAVENOUS
  Filled 2016-06-26: qty 2

## 2016-06-26 MED ORDER — ALBUTEROL SULFATE HFA 108 (90 BASE) MCG/ACT IN AERS: 2.0000 | INHALATION_SPRAY | RESPIRATORY_TRACT | Status: DC | PRN

## 2016-06-26 MED ORDER — FUROSEMIDE 10 MG/ML IJ SOLN
40.0000 mg | Freq: Two times a day (BID) | INTRAMUSCULAR | Status: DC
  Administered 2016-06-26: 40 mg via INTRAVENOUS
  Filled 2016-06-26: qty 4

## 2016-06-26 MED ORDER — SODIUM CHLORIDE 0.9 % IV SOLN: 250.0000 mL | INTRAVENOUS | Status: DC | PRN

## 2016-06-26 MED ORDER — DULOXETINE HCL 60 MG PO CPEP
60.0000 mg | ORAL_CAPSULE | Freq: Two times a day (BID) | ORAL | Status: DC
  Administered 2016-06-26 – 2016-06-30 (×8): 60 mg via ORAL
  Filled 2016-06-26 (×8): qty 1

## 2016-06-26 MED ORDER — ONDANSETRON HCL 4 MG/2ML IJ SOLN: 4.0000 mg | Freq: Four times a day (QID) | INTRAMUSCULAR | Status: DC | PRN

## 2016-06-26 MED ORDER — ATORVASTATIN CALCIUM 40 MG PO TABS
40.0000 mg | ORAL_TABLET | Freq: Every day | ORAL | Status: DC
  Administered 2016-06-26 – 2016-06-29 (×4): 40 mg via ORAL
  Filled 2016-06-26 (×4): qty 1

## 2016-06-26 MED ORDER — NITROGLYCERIN 0.4 MG SL SUBL: 0.4000 mg | SUBLINGUAL_TABLET | SUBLINGUAL | Status: DC | PRN

## 2016-06-26 MED ORDER — CLOPIDOGREL BISULFATE 75 MG PO TABS
75.0000 mg | ORAL_TABLET | Freq: Every day | ORAL | Status: DC
  Administered 2016-06-26 – 2016-06-30 (×5): 75 mg via ORAL
  Filled 2016-06-26 (×5): qty 1

## 2016-06-26 MED ORDER — FUROSEMIDE 10 MG/ML IJ SOLN: 20.0000 mg | Freq: Once | INTRAMUSCULAR | Status: DC

## 2016-06-26 MED ORDER — SODIUM CHLORIDE 0.9% FLUSH
3.0000 mL | Freq: Two times a day (BID) | INTRAVENOUS | Status: DC
  Administered 2016-06-26 – 2016-06-30 (×8): 3 mL via INTRAVENOUS

## 2016-06-26 MED ORDER — IPRATROPIUM-ALBUTEROL 0.5-2.5 (3) MG/3ML IN SOLN
3.0000 mL | Freq: Once | RESPIRATORY_TRACT | Status: AC
  Administered 2016-06-26: 3 mL via RESPIRATORY_TRACT
  Filled 2016-06-26: qty 3

## 2016-06-26 MED ORDER — ASPIRIN EC 81 MG PO TBEC
81.0000 mg | DELAYED_RELEASE_TABLET | Freq: Every day | ORAL | Status: DC
  Administered 2016-06-27 – 2016-06-30 (×4): 81 mg via ORAL
  Filled 2016-06-26 (×4): qty 1

## 2016-06-26 MED ORDER — METHYLPREDNISOLONE SODIUM SUCC 125 MG IJ SOLR
125.0000 mg | Freq: Once | INTRAMUSCULAR | Status: AC
  Administered 2016-06-26: 125 mg via INTRAVENOUS
  Filled 2016-06-26: qty 2

## 2016-06-26 MED ORDER — IPRATROPIUM-ALBUTEROL 0.5-2.5 (3) MG/3ML IN SOLN
3.0000 mL | Freq: Three times a day (TID) | RESPIRATORY_TRACT | Status: DC
  Administered 2016-06-27 – 2016-06-30 (×8): 3 mL via RESPIRATORY_TRACT
  Filled 2016-06-26 (×10): qty 3

## 2016-06-26 MED ORDER — ENOXAPARIN SODIUM 40 MG/0.4ML ~~LOC~~ SOLN
40.0000 mg | SUBCUTANEOUS | Status: DC
  Administered 2016-06-26 – 2016-06-29 (×4): 40 mg via SUBCUTANEOUS
  Filled 2016-06-26 (×5): qty 0.4

## 2016-06-26 MED ORDER — IPRATROPIUM-ALBUTEROL 0.5-2.5 (3) MG/3ML IN SOLN
3.0000 mL | Freq: Four times a day (QID) | RESPIRATORY_TRACT | Status: DC
  Administered 2016-06-26: 3 mL via RESPIRATORY_TRACT
  Filled 2016-06-26: qty 3

## 2016-06-26 MED ORDER — FUROSEMIDE 10 MG/ML IJ SOLN
40.0000 mg | Freq: Once | INTRAMUSCULAR | Status: AC
  Administered 2016-06-26: 40 mg via INTRAVENOUS
  Filled 2016-06-26: qty 4

## 2016-06-26 MED ORDER — ASPIRIN 81 MG PO CHEW
324.0000 mg | CHEWABLE_TABLET | Freq: Once | ORAL | Status: AC
  Administered 2016-06-26: 324 mg via ORAL
  Filled 2016-06-26: qty 4

## 2016-06-26 MED ORDER — INSULIN ASPART 100 UNIT/ML ~~LOC~~ SOLN
0.0000 [IU] | Freq: Three times a day (TID) | SUBCUTANEOUS | Status: DC
  Administered 2016-06-27 (×3): 1 [IU] via SUBCUTANEOUS
  Administered 2016-06-28 – 2016-06-29 (×2): 2 [IU] via SUBCUTANEOUS
  Administered 2016-06-29 – 2016-06-30 (×2): 1 [IU] via SUBCUTANEOUS

## 2016-06-26 NOTE — ED Notes (Signed)
Pt is alert and oriented x 3 , pt denies pain at this time and does not appear to be in distress. Pt was given a meal tray. Pt son is at bedside.

## 2016-06-26 NOTE — H&P (Addendum)
History and Physical    JESS MOTE [id number] DOB: 1936/03/04 DOA: 06/26/2016  Referring MD/NP/PA: Fonnie Birkenhead PCP: Alysia Penna, MD  Outpatient Specialists:  Dr. Sallyanne Kuster Patient coming from:  Home with home health services  Chief Complaint:  Shortness of breath and urinary incontinence  HPI: DEBONY SWEE is a 81 y.o. female with medical history significant of chronic systolic heart failure with EF of 20-25% sp/ AICD, CAD with NSTEMI in December 2017 that resulted in DES to the RCA, peripheral vascular disease s/p fem-pop bypass on the right and left carotid endarterectomy and a stent to the left subclavian artery.  She has COPD, diet controlled diabetes mellitus, GERD, dementia, and depression.  Patient states that her lasix was stopped due to some AKI and was never resumed after her last heart attack.  Her medications were reduced by the supervising physician at her SNF and she is not sure what she is currently taking.  For the last three days, she has had worsening orthopnea with PND with wheezing.  She denies lower extremity edema, abdominal distension, and puffiness of the hands and feet.  She denies chest pains.  She states she can tell when she is "volume up" when she starts having urinary incontinence which occurred three times last night.  She denies fevers, chills, sore throat, sinus congestion.  Has a dry cough and denies hemoptysis.    ED Course:  In the ER, vital signs notable for hypoxia requiring 2-3L [place], tachypnea to the 30s, and tachycardia to the 110s.  Afebrile and blood pressures are stable.  WBC is also normal.  Creatinine is 0.86.  Troponin mildly elevated at 0.12 and BNP up to 963 with a baseline of 200-300 during previous admission.  CXR demonstrates pulmonary edema with bilateral pleural effusions.    She was given lasix 40mg  IV once by the ER MD and voided twice during my visit.   Review of Systems:  General:  Denies fevers, chills, weight loss or  gain HEENT:  Denies changes to hearing and vision, rhinorrhea, sinus congestion, sore throat CV:  Denies chest pain and palpitations, lower extremity edema.  PULM:   SOB, wheezing, cough.   GI:  Denies nausea, vomiting, constipation, diarrhea.   GU:  Denies dysuria, frequency, urgency ENDO:  Denies polyuria, polydipsia.   HEME:  Denies hematemesis, blood in stools, melena, abnormal bruising or bleeding.  LYMPH:  Denies lymphadenopathy.   MSK:  Denies arthralgias, myalgias.   DERM:  Denies skin rash or ulcer.   NEURO:  Denies focal numbness, weakness, slurred speech, confusion, facial droop.  PSYCH:  Positive anxiety and depression.  Passive suidical thoughts but denies plans to harm self.    Past Medical History:  Diagnosis Date  . Arthritis   . Automatic implantable cardioverter-defibrillator in situ   . Bronchitis   . CAD (coronary artery disease)    a. s/p DES to LCx/RCA 05/2016, ostial LAD disease.  . Cardiomyopathy ([place])   . Chronic back pain   . Chronic combined systolic and diastolic CHF (congestive heart failure) ([place])   . Chronic constipation   . Chronic pain   . CKD (chronic kidney disease), stage III   . Colon polyps 2003.  2015.   HP polyps 2003.  adnomas 2015.  required referal to [facility] for colonoscopic resection of flat polyps.   Marland Kitchen COPD (chronic obstructive pulmonary disease) ([place])   . Dementia   . Depression with anxiety    takes Cymbalta daily  . Diabetes  mellitus ([place])   . Early cataracts, bilateral   . Fibromyalgia   . GERD (gastroesophageal reflux disease)    was on meds but was taken off;now watches what she eats  . Hemorrhoids   . History of kidney stones   . Hx of colonic polyps   . Hyperlipemia    takes Crestor daily  . Hypertension    takes Amlodipine and Metoprolol daily  . Insomnia   . LBBB (left bundle branch block)    Stress test 09/03/2010, EF 55  . Myocardial infarction   . PAD (peripheral artery disease) (HCC)    Carotid, subclavian,  and lower extremity beds, currently not symptomatic  . Presence of combination internal cardiac defibrillator (ICD) and pacemaker   . Presence of permanent cardiac pacemaker   . Pulmonary hypertension   . S/P angioplasty with stent, lt. subclavian 07/31/11 08/01/2011  . Subclavian arterial stenosis, lt, with PTA/STENT 07/31/11 08/01/2011  . Syncope 07/28/2011   EF - 50-55, moderate concentric hypertrophy in left ventricle  . Urinary incontinence   . Vertigo    takes Meclizine prn    Past Surgical History:  Procedure Laterality Date  . ABDOMINAL AORTAGRAM N/A 08/12/2013   Procedure: ABDOMINAL Maxcine Ham;  Surgeon: Elam Dutch, MD;  Location[facility] CATH LAB;  Service: Cardiovascular;  Laterality: N/A;  . ABDOMINAL HYSTERECTOMY    . APPENDECTOMY    . BACK SURGERY  2012  . BIV ICD GENERTAOR CHANGE OUT Left 02/20/2012   Procedure: BIV ICD GENERTAOR CHANGE OUT;  Surgeon: [name], MD;  Location: [facility] CATH LAB;  Service: Cardiovascular;  Laterality: Left;  . CARDIAC CATHETERIZATION  12/01/2007   By Dr. Melvern Banker, left heart cath,   . CARDIAC CATHETERIZATION N/A 05/20/2016   Procedure: Right/Left Heart Cath and Coronary Angiography;  Surgeon: Sherren Mocha, MD;  Location: [place] CV LAB;  Service: Cardiovascular;  Laterality: N/A;  . CARDIAC CATHETERIZATION N/A 05/20/2016   Procedure: Coronary Stent Intervention;  Surgeon: Sherren Mocha, MD;  Location: [place] CV LAB;  Service: Cardiovascular;  Laterality: N/A;  . CARDIAC DEFIBRILLATOR PLACEMENT  05/2008   By Dr Blanch Media, [employer] CANNOT HAVE MRI's  . CAROTID ANGIOGRAM N/A 07/31/2011   Procedure: CAROTID ANGIOGRAM;  Surgeon: Lorretta Harp, MD;  Location: [facility] CATH LAB;  Service: Cardiovascular;  Laterality: N/A;  carotid angiogram and possible Lt SCA PTA  . COLONOSCOPY W/ POLYPECTOMY  12/2013  . CORONARY ANGIOPLASTY    . ENDARTERECTOMY Left 11/08/2014   Procedure: LEFT CAROTID ENDARTERECTOMY WITH HEMASHIELD PATCH ANGIOPLASTY;  Surgeon:  Elam Dutch, MD;  Location: [place];  Service: Vascular;  Laterality: Left;  . ESOPHAGOGASTRODUODENOSCOPY N/A 01/20/2014   Procedure: ESOPHAGOGASTRODUODENOSCOPY (EGD);  Surgeon: Jerene Bears, MD;  Location: [facility] ENDOSCOPY;  Service: Endoscopy;  Laterality: N/A;  . FEMORAL-POPLITEAL BYPASS GRAFT Right 10/12/2013   Procedure:   Femoral-Peroneal trunk  bypass with nonreversed greater saphenous vein graft;  Surgeon: Elam Dutch, MD;  Location: [place];  Service: Vascular;  Laterality: Right;  . GIVENS CAPSULE STUDY N/A 01/20/2014   Procedure: GIVENS CAPSULE STUDY;  Surgeon: Jerene Bears, MD;  Location: [place];  Service: Gastroenterology;  Laterality: N/A;  . INTRAOPERATIVE ARTERIOGRAM Right 10/12/2013   Procedure: INTRA OPERATIVE ARTERIOGRAM;  Surgeon: Elam Dutch, MD;  Location: [place];  Service: Vascular;  Laterality: Right;  . ORIF ELBOW FRACTURE  08/16/2011   Procedure: OPEN REDUCTION INTERNAL FIXATION (ORIF) ELBOW/OLECRANON FRACTURE;  Surgeon: Schuyler Amor, MD;  Location: [place];  Service:  Orthopedics;  Laterality: Left;  . RENAL ANGIOGRAM N/A 08/12/2013   Procedure: RENAL ANGIOGRAM;  Surgeon: Elam Dutch, MD;  Location: [facility] CATH LAB;  Service: Cardiovascular;  Laterality: N/A;  . SUBCLAVIAN STENT PLACEMENT Left 07/31/2011   7x18 [place], balloon, with reduction of 90% ostial left subclavian artery stenosis to 0% with residual excellent flow  . TONSILLECTOMY    . TUBAL LIGATION       reports that she has been smoking Cigarettes.  She has a 62.00 pack-year smoking history. She has never used smokeless tobacco. She reports that she does not drink alcohol or use drugs.  Allergies  Allergen Reactions  . Potassium-Containing Compounds Other (See Comments)    Causes severe constipation  . Azithromycin Rash  . Codeine Itching  . Darvon Itching  . Erythromycin Rash  . Meloxicam Other (See Comments)    Unknown    . Norco [Hydrocodone-Acetaminophen] Itching  . Penicillins Rash  and Other (See Comments)    Has patient had a PCN reaction causing immediate rash, facial/tongue/throat swelling, SOB or lightheadedness with hypotension: unknown Has patient had a PCN reaction causing severe rash involving mucus membranes or skin necrosis: no Has patient had a PCN reaction that required hospitalization: unknown Has patient had a PCN reaction occurring within the last 10 years: no If all of the above answers are "NO", then may proceed with Cephalosporin use.   Marland Kitchen Propoxyphene N-Acetaminophen Itching  . Rofecoxib Other (See Comments)    Unknown    . Rosuvastatin Other (See Comments)    cramps  . Statins Itching and Other (See Comments)    Sleeplessness  . Sulfa Antibiotics Rash    Family History  Problem Relation Age of Onset  . CAD Father   . Heart disease Father   . Hyperlipidemia Father   . Heart disease Mother   . Deep vein thrombosis Son   . Hyperlipidemia    . Colon cancer Maternal Grandmother   . Cancer Sister     ovarian  . Diabetes Sister   . Heart disease Sister   . Anesthesia problems Neg Hx   . Hypotension Neg Hx   . Malignant hyperthermia Neg Hx   . Pseudochol deficiency Neg Hx     Prior to Admission medications   Medication Sig Start Date End Date Taking? Authorizing Provider  albuterol (PROVENTIL HFA;VENTOLIN HFA) 108 (90 Base) MCG/ACT inhaler Inhale 2 puffs into the lungs every 4 (four) hours as needed for wheezing or shortness of breath. 10/12/15  Yes Laurey Morale, MD  allopurinol (ZYLOPRIM) 300 MG tablet Take 1 tablet (300 mg total) by mouth daily. 10/15/15  Yes Laurey Morale, MD  ALPRAZolam Duanne Moron) 0.5 MG tablet Take 2 tablets (1 mg total) by mouth at bedtime as needed for anxiety. 06/20/16  Yes Laurey Morale, MD  atorvastatin (LIPITOR) 40 MG tablet Take 1 tablet (40 mg total) by mouth daily at 6 PM. 06/20/16  Yes Laurey Morale, MD  bisoprolol (ZEBETA) 5 MG tablet Take 1 tablet (5 mg total) by mouth daily. 06/20/16  Yes Laurey Morale, MD   DULoxetine (CYMBALTA) 60 MG capsule TAKE ONE CAPSULE BY MOUTH TWICE DAILY Patient taking differently: TAKE 60mg  CAPSULE BY MOUTH TWICE DAILY 02/04/16  Yes Laurey Morale, MD  fluticasone [facility]) 50 MCG/ACT nasal spray Place 1 spray into both nostrils daily as needed for allergies.    Yes Historical Provider, MD  HYDROcodone-acetaminophen (NORCO) 10-325 MG tablet Take one tablet by mouth every  6 hours as needed for pain. DNE 3gm of APAP from all sources/24hours 05/31/16  Yes Patrecia Pour, MD  nitroGLYCERIN (NITROSTAT) 0.4 MG SL tablet Place 1 tablet (0.4 mg total) under the tongue every 5 (five) minutes x 3 doses as needed for chest pain. 10/12/15  Yes Laurey Morale, MD  ticagrelor (BRILINTA) 90 MG TABS tablet Take 1 tablet (90 mg total) by mouth 2 (two) times daily. 05/23/16  Yes Reyne Dumas, MD  aspirin 81 MG chewable tablet Chew 1 tablet (81 mg total) by mouth daily. Patient not taking: Reported on 06/26/2016 05/23/16   Reyne Dumas, MD  dextromethorphan-guaiFENesin [facility] DM) 30-600 MG 12hr tablet Take 1 tablet by mouth 2 (two) times daily. Patient not taking: Reported on 06/26/2016 05/23/16   Reyne Dumas, MD  ipratropium-albuterol (DUONEB) 0.5-2.5 (3) MG/3ML SOLN Take 3 mLs by nebulization 3 (three) times daily. Patient not taking: Reported on 06/26/2016 05/23/16   Reyne Dumas, MD  potassium chloride SA (K-DUR,KLOR-CON) 20 MEQ tablet Take 1 tablet (20 mEq total) by mouth daily. Patient not taking: Reported on 06/26/2016 06/20/16   Laurey Morale, MD    Physical Exam: Vitals:   06/26/16 1630 06/26/16 1700 06/26/16 1730 06/26/16 1800  BP: 146/67 165/93 135/65 160/87  Pulse: 114 110 108 100  Resp: 26 20 22 25   Temp:      TempSrc:      SpO2: 96% 93% 93% 95%    Constitutional: Adult female, NAD, calm, comfortable, tearful.  On 2L [place]  Eyes: PERRL, lids and conjunctivae normal ENMT: Mucous membranes are moist. Posterior pharynx clear of any exudate or lesions. Poor dentition.  Neck: normal,  supple, no masses, no thyromegaly Respiratory: Rales to the apices bilaterally, diminished at the bilateral bases, full exp wheeze, no rhonchi.  Normal respiratory effort. No accessory muscle use.  Cardiovascular: IRRR, no murmurs / rubs / gallops. No extremity edema. 2+ pedal pulses. No carotid bruits.  Abdomen: no tenderness, no masses palpated. No hepatosplenomegaly. Bowel sounds positive.  Musculoskeletal: no clubbing / cyanosis. No joint deformity upper and lower extremities. Good ROM, no contractures. Normal muscle tone.  Skin: no rashes, lesions, ulcers. No induration Neurologic: CN 2-12 grossly intact. Sensation intact, DTR normal. Strength 5/5 in all 4.  Psychiatric: Normal judgment and insight. Alert and oriented x 3. Depressed mood.    Labs on Admission: I have personally reviewed following labs and imaging studies  CBC:  Recent Labs Lab 06/20/16 1526 06/26/16 1347  WBC 7.1 8.7  NEUTROABS 4.6 5.9  HGB 13.5 14.4  HCT 42.0 45.0  MCV 91.9 90.4  PLT 311.0 [id number]*   Basic Metabolic Panel:  Recent Labs Lab 06/20/16 1526 06/26/16 1347  NA 144 142  K 3.8 3.4*  CL 105 105  CO2 31 26  GLUCOSE 182* 134*  BUN 7 6  CREATININE 0.83 0.86  CALCIUM 9.2 9.2   GFR: Estimated Creatinine Clearance: 54.3 mL/min (by C-G formula based on SCr of 0.86 mg/dL). Liver Function Tests:  Recent Labs Lab 06/26/16 1347  AST 17  ALT 11*  ALKPHOS 140*  BILITOT 1.5*  PROT 6.9  ALBUMIN 4.0   No results for input(s): LIPASE, AMYLASE in the last 168 hours. No results for input(s): AMMONIA in the last 168 hours. Coagulation Profile: No results for input(s): INR, PROTIME in the last 168 hours. Cardiac Enzymes:  Recent Labs Lab 06/26/16 1347  TROPONINI 0.12*   BNP (last 3 results) No results for input(s): PROBNP in the last 8760  hours. HbA1C: No results for input(s): HGBA1C in the last 72 hours. CBG: No results for input(s): GLUCAP in the last 168 hours. Lipid Profile: No results  for input(s): CHOL, HDL, LDLCALC, TRIG, CHOLHDL, LDLDIRECT in the last 72 hours. Thyroid Function Tests: No results for input(s): TSH, T4TOTAL, FREET4, T3FREE, THYROIDAB in the last 72 hours. Anemia Panel: No results for input(s): VITAMINB12, FOLATE, FERRITIN, TIBC, IRON, RETICCTPCT in the last 72 hours. Urine analysis:    Component Value Date/Time   COLORURINE YELLOW 06/26/2016 1601   APPEARANCEUR CLEAR 06/26/2016 1601   LABSPEC 1.008 06/26/2016 1601   PHURINE 7.0 06/26/2016 1601   GLUCOSEU NEGATIVE 06/26/2016 1601   HGBUR NEGATIVE 06/26/2016 1601   HGBUR negative 01/01/2010 1353   BILIRUBINUR NEGATIVE 06/26/2016 1601   BILIRUBINUR n 05/30/2015 1542   KETONESUR NEGATIVE 06/26/2016 1601   PROTEINUR 30 (A) 06/26/2016 1601   UROBILINOGEN 0.2 05/30/2015 1542   UROBILINOGEN 1.0 11/03/2014 1442   NITRITE NEGATIVE 06/26/2016 1601   LEUKOCYTESUR NEGATIVE 06/26/2016 1601   Sepsis Labs: @LABRCNTIP (procalcitonin:4,lacticidven:4) )No results found for this or any previous visit (from the past 240 hour(s)).   Radiological Exams on Admission: Dg Chest 2 View  Result Date: 06/26/2016 CLINICAL DATA:  Shortness of breath, low oxygen saturation, COPD and dementia EXAM: CHEST  2 VIEW COMPARISON:  Chest x-ray of 05/30/2016 FINDINGS: Moderate cardiomegaly is present with AICD and pacer leads noted. However there are changes of congestive heart failure with small bilateral effusions and pulmonary vascular congestion. No parenchymal infiltrate is seen. Calcified granuloma remains in the left lower lobe superior segment. The bones are osteopenic. IMPRESSION: 1. Mild to minor CHF with small effusions. 2. Stable cardiomegaly with pacer and AICD leads. Electronically Signed   By: Ivar Drape M.D.   On: 06/26/2016 14:25   Dg Sacrum/coccyx  Result Date: 06/26/2016 CLINICAL DATA:  Recent falls, pain over the sacrum and hips EXAM: SACRUM AND COCCYX - 2+ VIEW COMPARISON:  Lumbar spine films of 02/11/2016  FINDINGS: The sacrococcygeal elements are in normal alignment. No fracture is seen. Hardware for posterior fusion from L4-S1 is noted. The more inferior of the sacral screws is fractured, but that is not an acute process, being present previously. The pelvic rami are intact. The SI joints are corticated. The sacral foramina are unremarkable. IMPRESSION: 1. No sacrococcygeal fracture is seen. 2. Stable posterior fusion from L4-S1. No change in fractured lower sacral screw. Electronically Signed   By: Ivar Drape M.D.   On: 06/26/2016 14:27   Ct Head Wo Contrast  Result Date: 06/26/2016 CLINICAL DATA:  Multiple falls. EXAM: CT HEAD WITHOUT CONTRAST TECHNIQUE: Contiguous axial images were obtained from the base of the skull through the vertex without intravenous contrast. COMPARISON:  06/03/2016 FINDINGS: Brain: There is no evidence for acute hemorrhage, hydrocephalus, mass lesion, or abnormal extra-axial fluid collection. No definite CT evidence for acute infarction. Diffuse loss of parenchymal volume is consistent with atrophy. Patchy low attenuation in the deep hemispheric and periventricular white matter is nonspecific, but likely reflects chronic microvascular ischemic demyelination. Vascular: Atherosclerotic calcification is visualized in the carotid arteries. No dense MCA sign. Major dural sinuses are unremarkable. Skull: No evidence for fracture. No worrisome lytic or sclerotic lesion. Sinuses/Orbits: The visualized paranasal sinuses and mastoid air cells are clear. Visualized portions of the globes and intraorbital fat are unremarkable. Other: None. IMPRESSION: 1. Stable.  No acute intracranial abnormality. 2. Atrophy with chronic small vessel white matter ischemic disease. Electronically Signed   By: Randall Hiss  Tery Sanfilippo M.D.   On: 06/26/2016 14:30   Dg Hips Bilat With Pelvis 2v  Result Date: 06/26/2016 CLINICAL DATA:  Recent falls, sacral and hip pain EXAM: DG HIP (WITH OR WITHOUT PELVIS) 2V BILAT  COMPARISON:  Lumbar spine films of 02/11/2016 FINDINGS: There is moderate degenerative joint disease involving both hips with some loss of joint space and sclerosis with spurring. No acute fracture is seen. The pelvic rami are intact. The SI joints appear corticated. IMPRESSION: Moderate degenerative joint disease of the hips. No acute abnormality. Electronically Signed   By: Ivar Drape M.D.   On: 06/26/2016 14:29    EKG: Independently reviewed. ECG with permanently paced rhythm, atrial sensed, venticularly paced, mild flattening of the later T-waves compared to prior  Assessment/Plan Principal Problem:   Acute on chronic combined systolic and diastolic CHF (congestive heart failure) (HCC) Active Problems:   Essential hypertension   Biventricular implantable cardioverter-defibrillator in situ   Cardiomyopathy ([place])   CHF exacerbation (HCC)   CAD in native artery   Ischemic mitral valve regurgitation   Pulmonary hypertension   Controlled diabetes mellitus type 2 with complications (HCC)   CKD (chronic kidney disease), stage III   Elevated lactic acid level   Chronic respiratory failure with hypoxia (HCC)   Elevated troponin   Acute on chronic systolic heart failure (HCC)   Acute respiratory failure with hypoxia secondary to acute on chronic systolic heart failure.  Lasix was never restarted after the patient's last admission, however, her creatinine is now back to baseline.  Her wt appears to be down from prior but she may have had some weight loss due to illness.   -  Admit to telemetry -  Lasix 40mg  IV BID -  Daily weights -  Strict I/O -  Daily potassium  Elevated troponin, likely due to demand ischemia secondary to heart failure exacerbation, but does have CAD.  Suspect troponin would be much higher if this were in-stent thrombosis.   -  Cycle troponins -  Continue plavix and add aspirin 81mg  daily -  Continue beta blocker and statin -  Cardiology consulted -  ECHO  Lactic  acidosis likely due to CHF.  Will repeat in AM after diuresis.  Gout, stable, continue allopurinol  COPD.  CXR consistent with heart failrue -  Continue duonebs   Anxiety and depression, stable -  Continue cymbalta and xanax  Chronic pain, stable, continue norco prn  Diabetes mellitus type 2, A1c 6.9 -  Low dose SSI  DVT prophylaxis: lovenox  Code Status:  DNR Family Communication: patient alone  Disposition Plan: likely to SNF in 3-4 days  Consults called: Cardiology by ER MD  Admission status: inpatient, telemetry.  Patient in serious condition due to age, frailty, respiratory distress and high risk for decompensation due to CAD and underlying COPD.     Janece Canterbury MD [facility] Pager [phone]  If 7PM-7AM, please contact night-coverage [email or web address] Password [facility]  06/26/2016, 6:37 PM

## 2016-06-26 NOTE — ED Triage Notes (Signed)
Pt c/o SOB. Pt has home O2 prn, but patient was on RA when EMS arrived, 84%. Pt improved to 98% with 4L [place]. Pt c/o sacral pain r/t recent falls.CBG by EMS 226.

## 2016-06-26 NOTE — Consult Note (Signed)
Cardiology Consultation Note    Patient ID: Sydney Phillips, MRN: [id number], DOB/AGE: [birth year] 81 y.o. Admit date: 06/26/2016   Date of Consult: 06/26/2016 Primary Physician: Alysia Penna, MD Primary Cardiologist: Dr. Sallyanne Kuster  Chief Complaint: SOB, sacral pain Reason for Consultation: CHF, elevated troponin Requesting MD: Dr. Myrene Buddy  HPI: Sydney Phillips is a 81 y.o. female with history of CAD (NSTEMI s/p DES to RCA/Cx 05/2016), COPD with chronic respiratory failure, OSA, HTN, HLD, LBBB, h/o mixed cardiomyopathy s/p Medtronic CRT-D (original ICD 2009, s/p device change out 2013), chronic combined CHF, carotid artery disease s/p L CEA, history of L subclavian artery stenosis s/p stent in February 2013, history of lower extremity PAD s/p right fem-pop 2015, chronic pain, DM, CKD stage III, tobacco abuse who presented to [facility] with worsening SOB. She has had multiple admissions in the past several months. In September she was here for altered mental status and recurrent falls with hyperkalemia and AKI. Diuretic was held. In 03/2016 she was admitted for acute respiratory failure in setting of COPD/CHF. In 05/2016 she ruled in for NSTEMI with new low EF - 2D echo 11/301/7: EF 25-30%, diffuse HK, diastolic/systolic flattening, mod-severe MR, mod-severe pulm HTN. LHC 05/20/16 s/p DES to RCA and Lcx, moderate ostial LAD disease, elevated PCWP/LVEDP, moderate pulmonary HTN likely related to left heart disease. She was readmitted 12/15-12/16/17 with altered mental status (seeing dead people, confused, agitated), felt possibly due to steroid psychosis. She was also found to have lactic acidosis that improved with IVF. Again, diuretic held due to AKI. She was seen in the ED 06/03/16 for fall and traumatic hematoma of the head. CT head negative for intracranial hemorrhage. It appears more recently she's been at home. There are phone notes indicating the patient has refused to see anyone from [facility]  visits, PT or OT. "The son is catching them at the door or phone calls and stating it is not a good day. Son has not returned any of their calls, and will not let anyone come." She presented back to the ER with sacral pain as well as progressive SOB. Denies any chest pain, palpitations, syncope, edema. States her BP has been running higher today here in the ER. K 3.4, Cr 0.86, BNP 963, troponin 0.12, lactic acid 2.34, platelets 431, WBC 8.7. CT head nonacute. CXR with mild-mod CHF. ED also pursued w/u for sacral pain. The patient was treated with ASA, fentanyl, Lasix 40mg , Duoneb, Solu Medrol, potassium 68meq. She states she is presently feeling a lot better but still not 100%.  Past Medical History:  Diagnosis Date  . Arthritis   . Automatic implantable cardioverter-defibrillator in situ   . Bronchitis   . CAD (coronary artery disease)    Currently angina free, no evidence of reversible ischemia  . Chronic back pain   . Chronic constipation   . Colon polyps 2003.  2015.   HP polyps 2003.  adnomas 2015.  required referal to [facility] for colonoscopic resection of flat polyps.   . Congestive heart failure (CHF) (HCC)    New York Heart Association functional class 2, diastolic dysfunction  . COPD (chronic obstructive pulmonary disease) ([place])   . Dementia   . Depression with anxiety    takes Cymbalta daily  . Diabetes mellitus without complication (HCC)    no meds  . Early cataracts, bilateral   . Fibromyalgia   . GERD (gastroesophageal reflux disease)    was on meds but was taken off;now watches what  she eats  . Hemorrhoids   . History of kidney stones   . Hx of colonic polyps   . Hyperlipemia    takes Crestor daily  . Hypertension    takes Amlodipine and Metoprolol daily  . Insomnia   . LBBB (left bundle branch block)    Stress test 09/03/2010, EF 55  . Myocardial infarction   . PAD (peripheral artery disease) (HCC)    Carotid, subclavian, and lower extremity beds, currently not  symptomatic  . Presence of combination internal cardiac defibrillator (ICD) and pacemaker   . Presence of permanent cardiac pacemaker   . S/P angioplasty with stent, lt. subclavian 07/31/11 08/01/2011  . Shortness of breath    when over exerting self per pt.  . Subclavian arterial stenosis, lt, with PTA/STENT 07/31/11 08/01/2011  . Syncope 07/28/2011   EF - 50-55, moderate concentric hypertrophy in left ventricle  . Urinary incontinence   . Vertigo    takes Meclizine prn      Surgical History:  Past Surgical History:  Procedure Laterality Date  . ABDOMINAL AORTAGRAM N/A 08/12/2013   Procedure: ABDOMINAL Maxcine Ham;  Surgeon: Elam Dutch, MD;  Location: [facility] CATH LAB;  Service: Cardiovascular;  Laterality: N/A;  . ABDOMINAL HYSTERECTOMY    . APPENDECTOMY    . BACK SURGERY  2012  . BIV ICD GENERTAOR CHANGE OUT Left 02/20/2012   Procedure: BIV ICD GENERTAOR CHANGE OUT;  Surgeon: [name], MD;  Location: [facility] CATH LAB;  Service: Cardiovascular;  Laterality: Left;  . CARDIAC CATHETERIZATION  12/01/2007   By Dr. Melvern Banker, left heart cath,   . CARDIAC CATHETERIZATION N/A 05/20/2016   Procedure: Right/Left Heart Cath and Coronary Angiography;  Surgeon: Sherren Mocha, MD;  Location: [place] CV LAB;  Service: Cardiovascular;  Laterality: N/A;  . CARDIAC CATHETERIZATION N/A 05/20/2016   Procedure: Coronary Stent Intervention;  Surgeon: Sherren Mocha, MD;  Location: [place] CV LAB;  Service: Cardiovascular;  Laterality: N/A;  . CARDIAC DEFIBRILLATOR PLACEMENT  05/2008   By Dr Blanch Media, Medtronic CANNOT HAVE MRI's  . CAROTID ANGIOGRAM N/A 07/31/2011   Procedure: CAROTID ANGIOGRAM;  Surgeon: Lorretta Harp, MD;  Location: [facility] CATH LAB;  Service: Cardiovascular;  Laterality: N/A;  carotid angiogram and possible Lt SCA PTA  . COLONOSCOPY W/ POLYPECTOMY  12/2013  . CORONARY ANGIOPLASTY    . ENDARTERECTOMY Left 11/08/2014   Procedure: LEFT CAROTID ENDARTERECTOMY WITH HEMASHIELD PATCH ANGIOPLASTY;   Surgeon: Elam Dutch, MD;  Location: [place];  Service: Vascular;  Laterality: Left;  . ESOPHAGOGASTRODUODENOSCOPY N/A 01/20/2014   Procedure: ESOPHAGOGASTRODUODENOSCOPY (EGD);  Surgeon: Jerene Bears, MD;  Location[facility] ENDOSCOPY;  Service: Endoscopy;  Laterality: N/A;  . FEMORAL-POPLITEAL BYPASS GRAFT Right 10/12/2013   Procedure:   Femoral-Peroneal trunk  bypass with nonreversed greater saphenous vein graft;  Surgeon: Elam Dutch, MD;  Location: [place];  Service: Vascular;  Laterality: Right;  . GIVENS CAPSULE STUDY N/A 01/20/2014   Procedure: GIVENS CAPSULE STUDY;  Surgeon: Jerene Bears, MD;  Location: [place];  Service: Gastroenterology;  Laterality: N/A;  . INTRAOPERATIVE ARTERIOGRAM Right 10/12/2013   Procedure: INTRA OPERATIVE ARTERIOGRAM;  Surgeon: Elam Dutch, MD;  Location: [place];  Service: Vascular;  Laterality: Right;  . ORIF ELBOW FRACTURE  08/16/2011   Procedure: OPEN REDUCTION INTERNAL FIXATION (ORIF) ELBOW/OLECRANON FRACTURE;  Surgeon: Schuyler Amor, MD;  Location: [place];  Service: Orthopedics;  Laterality: Left;  . RENAL ANGIOGRAM N/A 08/12/2013   Procedure: RENAL ANGIOGRAM;  Surgeon:  Elam Dutch, MD;  Location: [facility] CATH LAB;  Service: Cardiovascular;  Laterality: N/A;  . SUBCLAVIAN STENT PLACEMENT Left 07/31/2011   7x18 [place], balloon, with reduction of 90% ostial left subclavian artery stenosis to 0% with residual excellent flow  . TONSILLECTOMY    . TUBAL LIGATION       Home Meds: Prior to Admission medications   Medication Sig Start Date End Date Taking? Authorizing Provider  albuterol (PROVENTIL HFA;VENTOLIN HFA) 108 (90 Base) MCG/ACT inhaler Inhale 2 puffs into the lungs every 4 (four) hours as needed for wheezing or shortness of breath. 10/12/15  Yes Laurey Morale, MD  allopurinol (ZYLOPRIM) 300 MG tablet Take 1 tablet (300 mg total) by mouth daily. 10/15/15  Yes Laurey Morale, MD  ALPRAZolam Duanne Moron) 0.5 MG tablet Take 2 tablets (1 mg total) by mouth at  bedtime as needed for anxiety. 06/20/16  Yes Laurey Morale, MD  atorvastatin (LIPITOR) 40 MG tablet Take 1 tablet (40 mg total) by mouth daily at 6 PM. 06/20/16  Yes Laurey Morale, MD  bisoprolol (ZEBETA) 5 MG tablet Take 1 tablet (5 mg total) by mouth daily. 06/20/16  Yes Laurey Morale, MD  DULoxetine (CYMBALTA) 60 MG capsule TAKE ONE CAPSULE BY MOUTH TWICE DAILY Patient taking differently: TAKE 60mg  CAPSULE BY MOUTH TWICE DAILY 02/04/16  Yes Laurey Morale, MD  fluticasone [facility]) 50 MCG/ACT nasal spray Place 1 spray into both nostrils daily as needed for allergies.    Yes Historical Provider, MD  HYDROcodone-acetaminophen (NORCO) 10-325 MG tablet Take one tablet by mouth every 6 hours as needed for pain. DNE 3gm of APAP from all sources/24hours 05/31/16  Yes Patrecia Pour, MD  nitroGLYCERIN (NITROSTAT) 0.4 MG SL tablet Place 1 tablet (0.4 mg total) under the tongue every 5 (five) minutes x 3 doses as needed for chest pain. 10/12/15  Yes Laurey Morale, MD  ticagrelor (BRILINTA) 90 MG TABS tablet Take 1 tablet (90 mg total) by mouth 2 (two) times daily. 05/23/16  Yes Reyne Dumas, MD  aspirin 81 MG chewable tablet Chew 1 tablet (81 mg total) by mouth daily. Patient not taking: Reported on 06/26/2016 05/23/16   Reyne Dumas, MD  dextromethorphan-guaiFENesin [facility] DM) 30-600 MG 12hr tablet Take 1 tablet by mouth 2 (two) times daily. Patient not taking: Reported on 06/26/2016 05/23/16   Reyne Dumas, MD  ipratropium-albuterol (DUONEB) 0.5-2.5 (3) MG/3ML SOLN Take 3 mLs by nebulization 3 (three) times daily. Patient not taking: Reported on 06/26/2016 05/23/16   Reyne Dumas, MD  potassium chloride SA (K-DUR,KLOR-CON) 20 MEQ tablet Take 1 tablet (20 mEq total) by mouth daily. Patient not taking: Reported on 06/26/2016 06/20/16   Laurey Morale, MD    Inpatient Medications:     Allergies:  Allergies  Allergen Reactions  . Potassium-Containing Compounds Other (See Comments)    Causes severe constipation  .  Azithromycin Rash  . Codeine Itching  . Darvon Itching  . Erythromycin Rash  . Meloxicam Other (See Comments)    Unknown    . Norco [Hydrocodone-Acetaminophen] Itching  . Penicillins Rash and Other (See Comments)    Has patient had a PCN reaction causing immediate rash, facial/tongue/throat swelling, SOB or lightheadedness with hypotension: unknown Has patient had a PCN reaction causing severe rash involving mucus membranes or skin necrosis: no Has patient had a PCN reaction that required hospitalization: unknown Has patient had a PCN reaction occurring within the last 10 years: no If all of the  above answers are "NO", then may proceed with Cephalosporin use.   Marland Kitchen Propoxyphene N-Acetaminophen Itching  . Rofecoxib Other (See Comments)    Unknown    . Rosuvastatin Other (See Comments)    cramps  . Statins Itching and Other (See Comments)    Sleeplessness  . Sulfa Antibiotics Rash    Social History   Social History  . Marital status: Widowed    Spouse name: N/A  . Number of children: 3  . Years of education: N/A   Occupational History  . Not on file.   Social History Main Topics  . Smoking status: Current Every Day Smoker    Packs/day: 1.00    Years: 62.00    Types: Cigarettes  . Smokeless tobacco: Never Used     Comment: Down to 3-4 cigarettes per day  . Alcohol use No  . Drug use: No  . Sexual activity: No   Other Topics Concern  . Not on file   Social History Narrative   Ok to share information with medical POA, Son Gabriel Carina     Family History  Problem Relation Age of Onset  . CAD Father   . Heart disease Father   . Hyperlipidemia Father   . Heart disease Mother   . Deep vein thrombosis Son   . Hyperlipidemia    . Colon cancer Maternal Grandmother   . Cancer Sister     ovarian  . Diabetes Sister   . Heart disease Sister   . Anesthesia problems Neg Hx   . Hypotension Neg Hx   . Malignant hyperthermia Neg Hx   . Pseudochol deficiency Neg Hx        Review of Systems:intermittent falling as noted above, sacral pain, sleeps chronically on multiple pillows. All other systems reviewed and are otherwise negative except as noted above.  Labs:  Recent Labs  06/26/16 1347  TROPONINI 0.12*   Lab Results  Component Value Date   WBC 8.7 06/26/2016   HGB 14.4 06/26/2016   HCT 45.0 06/26/2016   MCV 90.4 06/26/2016   PLT 431 (H) 06/26/2016    Recent Labs Lab 06/26/16 1347  NA 142  K 3.4*  CL 105  CO2 26  BUN 6  CREATININE 0.86  CALCIUM 9.2  PROT 6.9  BILITOT 1.5*  ALKPHOS 140*  ALT 11*  AST 17  GLUCOSE 134*   Lab Results  Component Value Date   CHOL 193 05/15/2016   HDL 25 (L) 05/15/2016   LDLCALC 145 (H) 05/15/2016   TRIG 116 05/15/2016   Lab Results  Component Value Date   DDIMER 2.46 (H) 04/07/2016    Radiology/Studies:  Dg Chest 2 View  Result Date: 06/26/2016 CLINICAL DATA:  Shortness of breath, low oxygen saturation, COPD and dementia EXAM: CHEST  2 VIEW COMPARISON:  Chest x-ray of 05/30/2016 FINDINGS: Moderate cardiomegaly is present with AICD and pacer leads noted. However there are changes of congestive heart failure with small bilateral effusions and pulmonary vascular congestion. No parenchymal infiltrate is seen. Calcified granuloma remains in the left lower lobe superior segment. The bones are osteopenic. IMPRESSION: 1. Mild to minor CHF with small effusions. 2. Stable cardiomegaly with pacer and AICD leads. Electronically Signed   By: Ivar Drape M.D.   On: 06/26/2016 14:25   Dg Chest 2 View  Result Date: 05/30/2016 CLINICAL DATA:  Mental status changes. EXAM: CHEST  2 VIEW COMPARISON:  05/14/2016 FINDINGS: Left AICD remains in place, unchanged. Mild cardiomegaly. No overt  edema. Calcified granuloma in the left upper lobe. No confluent opacities or effusions. IMPRESSION: Cardiomegaly.  No active disease. Electronically Signed   By: Rolm Baptise M.D.   On: 05/30/2016 19:19   Dg Sacrum/coccyx  Result  Date: 06/26/2016 CLINICAL DATA:  Recent falls, pain over the sacrum and hips EXAM: SACRUM AND COCCYX - 2+ VIEW COMPARISON:  Lumbar spine films of 02/11/2016 FINDINGS: The sacrococcygeal elements are in normal alignment. No fracture is seen. Hardware for posterior fusion from L4-S1 is noted. The more inferior of the sacral screws is fractured, but that is not an acute process, being present previously. The pelvic rami are intact. The SI joints are corticated. The sacral foramina are unremarkable. IMPRESSION: 1. No sacrococcygeal fracture is seen. 2. Stable posterior fusion from L4-S1. No change in fractured lower sacral screw. Electronically Signed   By: Ivar Drape M.D.   On: 06/26/2016 14:27   Ct Head Wo Contrast  Result Date: 06/26/2016 CLINICAL DATA:  Multiple falls. EXAM: CT HEAD WITHOUT CONTRAST TECHNIQUE: Contiguous axial images were obtained from the base of the skull through the vertex without intravenous contrast. COMPARISON:  06/03/2016 FINDINGS: Brain: There is no evidence for acute hemorrhage, hydrocephalus, mass lesion, or abnormal extra-axial fluid collection. No definite CT evidence for acute infarction. Diffuse loss of parenchymal volume is consistent with atrophy. Patchy low attenuation in the deep hemispheric and periventricular white matter is nonspecific, but likely reflects chronic microvascular ischemic demyelination. Vascular: Atherosclerotic calcification is visualized in the carotid arteries. No dense MCA sign. Major dural sinuses are unremarkable. Skull: No evidence for fracture. No worrisome lytic or sclerotic lesion. Sinuses/Orbits: The visualized paranasal sinuses and mastoid air cells are clear. Visualized portions of the globes and intraorbital fat are unremarkable. Other: None. IMPRESSION: 1. Stable.  No acute intracranial abnormality. 2. Atrophy with chronic small vessel white matter ischemic disease. Electronically Signed   By: Misty Stanley M.D.   On: 06/26/2016 14:30   Ct  Head Wo Contrast  Result Date: 06/03/2016 CLINICAL DATA:  Pain following fall EXAM: CT HEAD WITHOUT CONTRAST CT CERVICAL SPINE WITHOUT CONTRAST TECHNIQUE: Multidetector CT imaging of the head and cervical spine was performed following the standard protocol without intravenous contrast. Multiplanar CT image reconstructions of the cervical spine were also generated. COMPARISON:  Head CT May 30, 2016; cervical spine CT May 14, 2016 FINDINGS: CT HEAD FINDINGS Brain: Mild diffuse atrophy is stable. There is no intracranial mass, hemorrhage, extra-axial fluid collection, or midline shift. There is stable patchy small vessel disease throughout the centra semiovale bilaterally. There is no new gray-white compartment lesion. No acute infarct is evident. Vascular: There is no hyperdense vessel. There is calcification in each carotid siphon region. Skull: The bony calvarium appears intact. There is hyperostosis frontalis bilaterally. There is a high left parietal scalp hematoma. Sinuses/Orbits: Visualized paranasal sinuses are clear. Orbits appear symmetric bilaterally. Other: Mastoid air cells are clear. CT CERVICAL SPINE FINDINGS Alignment: There is minimal anterolisthesis of C4 on C5, stable. No new spondylolisthesis. There is cervicothoracic levoscoliosis. Skull base and vertebrae: The skull base and 1 craniovertebral junction regions appear unremarkable 2. There is no demonstrable fracture. No blastic or lytic bone lesions are evident. Soft tissues and spinal canal: Prevertebral soft tissues and predental space regions are normal. There is no paraspinous lesion or high-grade spinal stenosis. Disc levels: There is mild to moderate disc space narrowing at C5-6 and C6-7. There is more severe disc space narrowing visualized upper thoracic region. There is facet hypertrophy at  multiple levels bilaterally. There is exit foraminal narrowing due to bony hypertrophy at C3-4 bilaterally, at C4-5 on the left, and at at  C5-6 on the right, with narrowing and impression on the exiting nerve root most marked at C5-6 on the right and at C4-5 on the left. No well-defined disc extrusion is seen on this noncontrast enhanced study. At C5-6 on the right, there is asymmetric calcified disc protrusion. Upper chest: Visualized lung bases are clear. Other: There is calcification in each carotid artery. There is a 7 mm nodular opacity arising from the left lobe of the thyroid. IMPRESSION: CT head: Atrophy with patchy small vessel disease in the supratentorial white matter bilaterally. No intracranial mass hemorrhage, or acute appearing infarct. No midline shift or extra-axial fluid. High left parietal scalp hematoma. CT cervical spine: No fracture. Minimal anterolisthesis of C4 on C5, felt to be due to underlying spondylosis. Multilevel arthropathy as summarized above. Calcification in each carotid artery. Subcentimeter left lobe thyroid nodule. Electronically Signed   By: Lowella Grip III M.D.   On: 06/03/2016 08:56   Ct Head Wo Contrast  Result Date: 05/30/2016 CLINICAL DATA:  Altered mental status. EXAM: CT HEAD WITHOUT CONTRAST TECHNIQUE: Contiguous axial images were obtained from the base of the skull through the vertex without intravenous contrast. COMPARISON:  05/14/2016. FINDINGS: Brain: Diffusely enlarged ventricles and subarachnoid spaces. Patchy white matter low density in both cerebral hemispheres. No intracranial hemorrhage, mass lesion or CT evidence of acute infarction. Vascular: No hyperdense vessel or unexpected calcification. Skull: Bilateral hyperostosis frontalis. Sinuses/Orbits: No acute finding. Other: None. IMPRESSION: No acute abnormality. Stable atrophy and chronic small vessel white matter ischemic changes. Electronically Signed   By: Claudie Revering M.D.   On: 05/30/2016 19:10   Ct Cervical Spine Wo Contrast  Result Date: 06/03/2016 CLINICAL DATA:  Pain following fall EXAM: CT HEAD WITHOUT CONTRAST CT  CERVICAL SPINE WITHOUT CONTRAST TECHNIQUE: Multidetector CT imaging of the head and cervical spine was performed following the standard protocol without intravenous contrast. Multiplanar CT image reconstructions of the cervical spine were also generated. COMPARISON:  Head CT May 30, 2016; cervical spine CT May 14, 2016 FINDINGS: CT HEAD FINDINGS Brain: Mild diffuse atrophy is stable. There is no intracranial mass, hemorrhage, extra-axial fluid collection, or midline shift. There is stable patchy small vessel disease throughout the centra semiovale bilaterally. There is no new gray-white compartment lesion. No acute infarct is evident. Vascular: There is no hyperdense vessel. There is calcification in each carotid siphon region. Skull: The bony calvarium appears intact. There is hyperostosis frontalis bilaterally. There is a high left parietal scalp hematoma. Sinuses/Orbits: Visualized paranasal sinuses are clear. Orbits appear symmetric bilaterally. Other: Mastoid air cells are clear. CT CERVICAL SPINE FINDINGS Alignment: There is minimal anterolisthesis of C4 on C5, stable. No new spondylolisthesis. There is cervicothoracic levoscoliosis. Skull base and vertebrae: The skull base and 1 craniovertebral junction regions appear unremarkable 2. There is no demonstrable fracture. No blastic or lytic bone lesions are evident. Soft tissues and spinal canal: Prevertebral soft tissues and predental space regions are normal. There is no paraspinous lesion or high-grade spinal stenosis. Disc levels: There is mild to moderate disc space narrowing at C5-6 and C6-7. There is more severe disc space narrowing visualized upper thoracic region. There is facet hypertrophy at multiple levels bilaterally. There is exit foraminal narrowing due to bony hypertrophy at C3-4 bilaterally, at C4-5 on the left, and at at C5-6 on the right, with narrowing and impression on the  exiting nerve root most marked at C5-6 on the right and at  C4-5 on the left. No well-defined disc extrusion is seen on this noncontrast enhanced study. At C5-6 on the right, there is asymmetric calcified disc protrusion. Upper chest: Visualized lung bases are clear. Other: There is calcification in each carotid artery. There is a 7 mm nodular opacity arising from the left lobe of the thyroid. IMPRESSION: CT head: Atrophy with patchy small vessel disease in the supratentorial white matter bilaterally. No intracranial mass hemorrhage, or acute appearing infarct. No midline shift or extra-axial fluid. High left parietal scalp hematoma. CT cervical spine: No fracture. Minimal anterolisthesis of C4 on C5, felt to be due to underlying spondylosis. Multilevel arthropathy as summarized above. Calcification in each carotid artery. Subcentimeter left lobe thyroid nodule. Electronically Signed   By: Lowella Grip III M.D.   On: 06/03/2016 08:56   Dg Hips Bilat With Pelvis 2v  Result Date: 06/26/2016 CLINICAL DATA:  Recent falls, sacral and hip pain EXAM: DG HIP (WITH OR WITHOUT PELVIS) 2V BILAT COMPARISON:  Lumbar spine films of 02/11/2016 FINDINGS: There is moderate degenerative joint disease involving both hips with some loss of joint space and sclerosis with spurring. No acute fracture is seen. The pelvic rami are intact. The SI joints appear corticated. IMPRESSION: Moderate degenerative joint disease of the hips. No acute abnormality. Electronically Signed   By: Ivar Drape M.D.   On: 06/26/2016 14:29    Wt Readings from Last 3 Encounters:  06/20/16 163 lb (73.9 kg)  06/13/16 171 lb 9.6 oz (77.8 kg)  06/04/16 171 lb (77.6 kg)    EKG: sinus tach 103 vpaced  Physical Exam: Blood pressure 130/69, pulse 116, temperature 97.5 F (36.4 C), temperature source Oral, resp. rate (!) 30, SpO2 96 %. There is no height or weight on file to calculate BMI. General: Well developed, well nourished overweight WF, in no acute distress. Head: Normocephalic, atraumatic, sclera  non-icteric, no xanthomas, nares are without discharge.  Neck: JVD elevated at 11-12cm Lungs: Decreased BS bilaterally at bases. Breathing is unlabored. Heart: RRR mildly tachycardic with S1 S2. No murmurs, rubs, or gallops appreciated. Abdomen: Soft, non-tender, non-distended with normoactive bowel sounds. No hepatomegaly. No rebound/guarding. No obvious abdominal masses. Msk:  Strength and tone appear normal for age. Extremities: No clubbing or cyanosis. No edema.  Distal pedal pulses are 2+ and equal bilaterally. Neuro: Alert and oriented X 3. No facial asymmetry. No focal deficit. Moves all extremities spontaneously. Psych:  Responds to questions appropriately with a normal affect.     Assessment and Plan  62F with CAD (NSTEMI s/p DES to RCA/Cx 05/2016), COPD with chronic respiratory failure, OSA, HTN, HLD, LBBB, h/o mixed cardiomyopathy s/p Medtronic CRT-D (original ICD 2009, s/p device change out 2013), chronic combined CHF, carotid artery disease s/p L CEA, history of L subclavian artery stenosis s/p stent in February 2013, history of lower extremity PAD s/p right fem-pop 2015, chronic pain, DM, CKD stage III, tobacco abuse, prior encephalopathy who presented to [facility] with worsening SOB.   1. Worsening SOB, suspected acute on chronic combined CHF and mitral regurgitation 2. CAD s/p PCI 05/2016, with elevated troponin 3. HTN, intermittent spikes in the ED 4. Lactic acidosis 5. COPD with chronic respiratory failure 6. Mixed cardiomyopathy s/p CRT-D 7. Sacral pain and frequent falls  From review of chart it appears the patient has had a recent pattern of admissions which include altered mental status/dehydration then holding of diuretic then fluid overload.  I'm not sure what is going on with her social situation at home (see reports above regarding son, home health) but she may ultimately benefit from SNF placement. Upon admission would recommend initiation of Lasix 40mg  IV BID and repletion  of her potassium. Resume home cardiac meds otherwise and follow blood pressure to determine if further titration is necessary. Troponin may be demand ischemia in nature. She has not had any chest pain. Would cycle for now. Continue DAPT. Further w/u of lactic acidosis per IM. See below for additional thoughts.  Signed, Charlie Pitter PA-C 06/26/2016, 5:24 PM Pager: [phone]   I saw evaluated the patient along with Melina Copa, PA-C in the emergency room. Very difficult historian. Very complicated history, but she did not provide much information. Does seem that she's had some worsening dyspnea over last couple days although it took 3 times asking the question. She feels better after diuresis but was also given several medications. Mild troponin elevation is probably simply related to her underlying illness with demand ischemia and not related to obstructive CAD. She is not having any anginal type symptoms. She needs to be on dual antiplatelet therapy if not on Brilinta Agree with diuresis with IV Lasix and maintaining steady potassium levels.  Lactic acidosis seems to be consistent with her prior hospitalizations. Will defer management to internal medicine service.  Ultimately, her bounce back is probably more related to social issues and her essential nonadherence to medical management than failed medical management. Her discharge planning will be important as I think that the home situation whether it is herself refusing to be seen plus or minus her son helping in this regard by listening to her wishes. Regardless the home health situation does not seem to be working.  Glenetta Hew, M.D., M.S. Interventional Cardiologist   Pager # [phone] Phone # [phone] [address] [place], [place] [address]

## 2016-06-26 NOTE — Telephone Encounter (Signed)
Noted. I see she is current an inpatient

## 2016-06-26 NOTE — ED Notes (Signed)
PT have been made aware of urine sample 

## 2016-06-26 NOTE — ED Provider Notes (Addendum)
[facility] DEPT Provider Note   CSN: [id number] Arrival date & time: 06/26/16  1241     History   Chief Complaint Chief Complaint  Patient presents with  . Shortness of Breath    HPI Sydney Phillips is a 81 y.o. female.  HPI 81 yo F with PMHx as below, including CAD, CHF (EF 20%, s/p recent DES 05/2016) HTN, dementia, COPD here with worsening SOB. Pt just returned home from SNF following hospitalization for AMS, NSTEMI s/p PCI with DES. Over last week she has been home, she has had worsening fatigue, cough, and orthopnea. She has started falling multiples times over last week 2/2 generalized weakness, often hitting her head. She has also had increased DOE and has difficulty getting around the house. No fever or chills. Mild yellow-green sputum production. Has been taking meds but was reportedly taken off her lasix at last hospitalization.  Past Medical History:  Diagnosis Date  . Arthritis   . Automatic implantable cardioverter-defibrillator in situ   . Bronchitis   . CAD (coronary artery disease)    Currently angina free, no evidence of reversible ischemia  . Chronic back pain   . Chronic constipation   . Colon polyps 2003.  2015.   HP polyps 2003.  adnomas 2015.  required referal to [facility] for colonoscopic resection of flat polyps.   . Congestive heart failure (CHF) (HCC)    New York Heart Association functional class 2, diastolic dysfunction  . COPD (chronic obstructive pulmonary disease) ([place])   . Dementia   . Depression with anxiety    takes Cymbalta daily  . Diabetes mellitus without complication (HCC)    no meds  . Early cataracts, bilateral   . Fibromyalgia   . GERD (gastroesophageal reflux disease)    was on meds but was taken off;now watches what she eats  . Hemorrhoids   . History of kidney stones   . Hx of colonic polyps   . Hyperlipemia    takes Crestor daily  . Hypertension    takes Amlodipine and Metoprolol daily  . Insomnia   . LBBB (left  bundle branch block)    Stress test 09/03/2010, EF 55  . Myocardial infarction   . PAD (peripheral artery disease) (HCC)    Carotid, subclavian, and lower extremity beds, currently not symptomatic  . Presence of combination internal cardiac defibrillator (ICD) and pacemaker   . Presence of permanent cardiac pacemaker   . S/P angioplasty with stent, lt. subclavian 07/31/11 08/01/2011  . Shortness of breath    when over exerting self per pt.  . Subclavian arterial stenosis, lt, with PTA/STENT 07/31/11 08/01/2011  . Syncope 07/28/2011   EF - 50-55, moderate concentric hypertrophy in left ventricle  . Urinary incontinence   . Vertigo    takes Meclizine prn    Patient Active Problem List   Diagnosis Date Noted  . 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 Chronic respiratory failure with hypoxia ([place])   . Acute encephalopathy 05/30/2016  . Chronic combined systolic and diastolic CHF (congestive heart failure) ([place]) 05/30/2016  . CKD (chronic kidney disease), stage III 05/30/2016  . Elevated lactic acid level 05/30/2016  . Leukocytosis   . Stenosis of left subclavian artery ([place]) 05/19/2016  . Fall at home   . Somnolence   . OSA and COPD overlap syndrome ([place])   . Cardiomyopathy, ischemic   . Ischemic mitral valve regurgitation   . Pulmonary hypertension   . Controlled diabetes mellitus type 2  with complications ([place])   . Encephalopathy   . CAD in native artery   . Acute renal failure with acute tubular necrosis superimposed on stage 3 chronic kidney disease ([place])   . COPD exacerbation ([place]) 05/14/2016  . Acute on chronic combined systolic and diastolic ACC/AHA stage C congestive heart failure ([place]) 05/14/2016  . Pleural effusion   . Acute on chronic respiratory failure with hypoxia ([place])   . CHF exacerbation ([place]) 04/06/2016  . Acute respiratory distress 04/06/2016  . Altered mental state 02/26/2016  . Syncopal episodes 02/26/2016  . Acute kidney injury ([place])  02/26/2016  . Skin lesion of back/upper right shoulder 02/26/2016  . Cardiomyopathy ([place]) 11/07/2015  . Smoking 11/07/2015  . Arthralgia 10/12/2015  . Left carotid stenosis 11/08/2014  . Diabetes mellitus ([place]) 05/02/2014  . NSTEMI (non-ST elevated myocardial infarction) ([place]) 01/20/2014  . Melena 01/20/2014  . Microcytic anemia 01/19/2014  . Chest pain 01/19/2014  . PAD (peripheral artery disease) ([place]) 10/12/2013  . S/P angioplasty with stent, lt. subclavian 07/31/11 08/01/2011  . Biventricular implantable cardioverter-defibrillator in situ 07/28/2011  . PVD, known severe, (previuosly asymptomatic) LSCA disease, now with "high grade" RICA disease. 07/28/2011  . Bronchitis, recent flare 07/28/2011  . Syncope,possible related to subclavian steal syndrome 07/27/2011  . Vertigo 07/27/2011  . Hypokalemia 07/27/2011  . SPINAL STENOSIS 01/28/2010  . CONSTIPATION 01/14/2010  . BACK PAIN, LUMBAR 01/01/2010  . INSOMNIA 01/23/2009  . ALLERGIC RHINITIS 11/10/2008  . Coronary atherosclerosis 06/21/2008  . HIP PAIN, BILATERAL 06/21/2008  . Myalgia and myositis 11/26/2007  . HLD (hyperlipidemia) 07/28/2007  . ACUTE SINUSITIS, UNSPECIFIED 07/28/2007  . WEIGHT GAIN 06/04/2007  . DYSPNEA 06/04/2007  . COPD (chronic obstructive pulmonary disease) with emphysema ([place]) 03/02/2007  . Depression with anxiety 02/25/2007  . Essential hypertension 02/25/2007  . GERD 02/25/2007  . COLONIC POLYPS, HX OF 02/25/2007    Past Surgical History:  Procedure Laterality Date  . ABDOMINAL AORTAGRAM N/A 08/12/2013   Procedure: ABDOMINAL Maxcine Ham;  Surgeon: Elam Dutch, MD;  Location: [facility] CATH LAB;  Service: Cardiovascular;  Laterality: N/A;  . ABDOMINAL HYSTERECTOMY    . APPENDECTOMY    . BACK SURGERY  2012  . BIV ICD GENERTAOR CHANGE OUT Left 02/20/2012   Procedure: BIV ICD GENERTAOR CHANGE OUT;  Surgeon: [name], MD;  Location: [facility] CATH LAB;  Service: Cardiovascular;  Laterality: Left;  . CARDIAC  CATHETERIZATION  12/01/2007   By Dr. Melvern Banker, left heart cath,   . CARDIAC CATHETERIZATION N/A 05/20/2016   Procedure: Right/Left Heart Cath and Coronary Angiography;  Surgeon: Sherren Mocha, MD;  Location: [facility] CV LAB;  Service: Cardiovascular;  Laterality: N/A;  . CARDIAC CATHETERIZATION N/A 05/20/2016   Procedure: Coronary Stent Intervention;  Surgeon: Sherren Mocha, MD;  Location: [facility] CV LAB;  Service: Cardiovascular;  Laterality: N/A;  . CARDIAC DEFIBRILLATOR PLACEMENT  05/2008   By Dr Blanch Media, [employer] CANNOT HAVE MRI's  . CAROTID ANGIOGRAM N/A 07/31/2011   Procedure: CAROTID ANGIOGRAM;  Surgeon: Lorretta Harp, MD;  Location: [facility] CATH LAB;  Service: Cardiovascular;  Laterality: N/A;  carotid angiogram and possible Lt SCA PTA  . COLONOSCOPY W/ POLYPECTOMY  12/2013  . CORONARY ANGIOPLASTY    . ENDARTERECTOMY Left 11/08/2014   Procedure: LEFT CAROTID ENDARTERECTOMY WITH HEMASHIELD PATCH ANGIOPLASTY;  Surgeon: Elam Dutch, MD;  Location: [facility];  Service: Vascular;  Laterality: Left;  . ESOPHAGOGASTRODUODENOSCOPY N/A 01/20/2014   Procedure: ESOPHAGOGASTRODUODENOSCOPY (EGD);  Surgeon: Jerene Bears, MD;  Location[facility] ENDOSCOPY;  Service: Endoscopy;  Laterality: N/A;  . FEMORAL-POPLITEAL BYPASS GRAFT Right 10/12/2013   Procedure:   Femoral-Peroneal trunk  bypass with nonreversed greater saphenous vein graft;  Surgeon: Elam Dutch, MD;  Location: [facility];  Service: Vascular;  Laterality: Right;  . GIVENS CAPSULE STUDY N/A 01/20/2014   Procedure: GIVENS CAPSULE STUDY;  Surgeon: Jerene Bears, MD;  Location: [facility];  Service: Gastroenterology;  Laterality: N/A;  . INTRAOPERATIVE ARTERIOGRAM Right 10/12/2013   Procedure: INTRA OPERATIVE ARTERIOGRAM;  Surgeon: Elam Dutch, MD;  Location: [facility];  Service: Vascular;  Laterality: Right;  . ORIF ELBOW FRACTURE  08/16/2011   Procedure: OPEN REDUCTION INTERNAL FIXATION (ORIF) ELBOW/OLECRANON FRACTURE;  Surgeon: Schuyler Amor,  MD;  Location: [facility];  Service: Orthopedics;  Laterality: Left;  . RENAL ANGIOGRAM N/A 08/12/2013   Procedure: RENAL ANGIOGRAM;  Surgeon: Elam Dutch, MD;  Location: [facility] CATH LAB;  Service: Cardiovascular;  Laterality: N/A;  . SUBCLAVIAN STENT PLACEMENT Left 07/31/2011   7x18 [place], balloon, with reduction of 90% ostial left subclavian artery stenosis to 0% with residual excellent flow  . TONSILLECTOMY    . TUBAL LIGATION      OB History    No data available       Home Medications    Prior to Admission medications   Medication Sig Start Date End Date Taking? Authorizing Provider  albuterol (PROVENTIL HFA;VENTOLIN HFA) 108 (90 Base) MCG/ACT inhaler Inhale 2 puffs into the lungs every 4 (four) hours as needed for wheezing or shortness of breath. 10/12/15  Yes Laurey Morale, MD  allopurinol (ZYLOPRIM) 300 MG tablet Take 1 tablet (300 mg total) by mouth daily. 10/15/15  Yes Laurey Morale, MD  ALPRAZolam Duanne Moron) 0.5 MG tablet Take 2 tablets (1 mg total) by mouth at bedtime as needed for anxiety. 06/20/16  Yes Laurey Morale, MD  atorvastatin (LIPITOR) 40 MG tablet Take 1 tablet (40 mg total) by mouth daily at 6 PM. 06/20/16  Yes Laurey Morale, MD  bisoprolol (ZEBETA) 5 MG tablet Take 1 tablet (5 mg total) by mouth daily. 06/20/16  Yes Laurey Morale, MD  DULoxetine (CYMBALTA) 60 MG capsule TAKE ONE CAPSULE BY MOUTH TWICE DAILY Patient taking differently: TAKE 60mg  CAPSULE BY MOUTH TWICE DAILY 02/04/16  Yes Laurey Morale, MD  fluticasone [facility]) 50 MCG/ACT nasal spray Place 1 spray into both nostrils daily as needed for allergies.    Yes Historical Provider, MD  HYDROcodone-acetaminophen (NORCO) 10-325 MG tablet Take one tablet by mouth every 6 hours as needed for pain. DNE 3gm of APAP from all sources/24hours 05/31/16  Yes Patrecia Pour, MD  nitroGLYCERIN (NITROSTAT) 0.4 MG SL tablet Place 1 tablet (0.4 mg total) under the tongue every 5 (five) minutes x 3 doses as needed for chest pain. 10/12/15  Yes  Laurey Morale, MD  ticagrelor (BRILINTA) 90 MG TABS tablet Take 1 tablet (90 mg total) by mouth 2 (two) times daily. 05/23/16  Yes Reyne Dumas, MD  aspirin 81 MG chewable tablet Chew 1 tablet (81 mg total) by mouth daily. Patient not taking: Reported on 06/26/2016 05/23/16   Reyne Dumas, MD  dextromethorphan-guaiFENesin [facility] DM) 30-600 MG 12hr tablet Take 1 tablet by mouth 2 (two) times daily. Patient not taking: Reported on 06/26/2016 05/23/16   Reyne Dumas, MD  ipratropium-albuterol (DUONEB) 0.5-2.5 (3) MG/3ML SOLN Take 3 mLs by nebulization 3 (three) times daily. Patient not taking: Reported on 06/26/2016 05/23/16   Reyne Dumas, MD  potassium chloride SA (K-DUR,KLOR-CON) 20 MEQ tablet Take 1 tablet (20 mEq total) by mouth daily. Patient not taking: Reported on 06/26/2016 06/20/16   Laurey Morale, MD    Family History Family History  Problem Relation Age of Onset  . CAD Father   . Heart disease Father   . Hyperlipidemia Father   . Heart disease Mother   . Deep vein thrombosis Son   . Hyperlipidemia    . Colon cancer Maternal Grandmother   . Cancer Sister     ovarian  . Diabetes Sister   . Heart disease Sister   . Anesthesia problems Neg Hx   . Hypotension Neg Hx   . Malignant hyperthermia Neg Hx   . Pseudochol deficiency Neg Hx     Social History Social History  Substance Use Topics  . Smoking status: Current Every Day Smoker    Packs/day: 1.00    Years: 62.00    Types: Cigarettes  . Smokeless tobacco: Never Used     Comment: Down to 3-4 cigarettes per day  . Alcohol use No     Allergies   Potassium-containing compounds; Azithromycin; Codeine; Darvon; Erythromycin; Meloxicam; Norco [hydrocodone-acetaminophen]; Penicillins; Propoxyphene n-acetaminophen; Rofecoxib; Rosuvastatin; Statins; and Sulfa antibiotics   Review of Systems Review of Systems  Constitutional: Positive for fatigue. Negative for chills and fever.  HENT: Negative for congestion and rhinorrhea.    Eyes: Negative for visual disturbance.  Respiratory: Positive for cough and shortness of breath. Negative for wheezing.   Cardiovascular: Positive for leg swelling. Negative for chest pain.  Gastrointestinal: Negative for abdominal pain, diarrhea, nausea and vomiting.  Genitourinary: Negative for dysuria and flank pain.  Musculoskeletal: Negative for neck pain and neck stiffness.  Skin: Negative for rash and wound.  Allergic/Immunologic: Negative for immunocompromised state.  Neurological: Negative for syncope, weakness and headaches.  All other systems reviewed and are negative.    Physical Exam Updated Vital Signs BP 130/69   Pulse 116   Temp 97.5 F (36.4 C) (Oral)   Resp (!) 30   SpO2 96%   Physical Exam  Constitutional: She is oriented to person, place, and time. She appears well-developed and well-nourished. No distress.  HENT:  Head: Normocephalic and atraumatic.  Eyes: Conjunctivae are normal.  Neck: Neck supple.  Cardiovascular: Normal rate, regular rhythm and normal heart sounds.  Exam reveals no friction rub.   No murmur heard. Pulmonary/Chest: Effort normal. Tachypnea noted. No respiratory distress. She has decreased breath sounds. She has wheezes in the right middle field, the right lower field, the left middle field and the left lower field. She has rales in the right lower field and the left lower field.  Abdominal: She exhibits no distension.  Musculoskeletal: She exhibits edema (1+ b/l LE).  Neurological: She is alert and oriented to person, place, and time. She exhibits normal muscle tone.  Skin: Skin is warm. Capillary refill takes less than 2 seconds.  Psychiatric: She has a normal mood and affect.  Nursing note and vitals reviewed.    ED Treatments / Results  Labs (all labs ordered are listed, but only abnormal results are displayed) Labs Reviewed  CBC WITH DIFFERENTIAL/PLATELET - Abnormal; Notable for the following:       Result Value   RDW 16.1  (*)    Platelets 431 (*)    All other components within normal limits  COMPREHENSIVE METABOLIC PANEL - Abnormal; Notable for the following:    Potassium 3.4 (*)    Glucose, Bld 134 (*)  ALT 11 (*)    Alkaline Phosphatase 140 (*)    Total Bilirubin 1.5 (*)    All other components within normal limits  BLOOD GAS, VENOUS - Abnormal; Notable for the following:    pCO2, Ven 40.4 (*)    All other components within normal limits  BRAIN NATRIURETIC PEPTIDE - Abnormal; Notable for the following:    B Natriuretic Peptide 963.2 (*)    All other components within normal limits  TROPONIN I - Abnormal; Notable for the following:    Troponin I 0.12 (*)    All other components within normal limits  I-STAT CG4 LACTIC ACID, ED - Abnormal; Notable for the following:    Lactic Acid, Venous 2.34 (*)    All other components within normal limits  URINALYSIS, ROUTINE W REFLEX MICROSCOPIC  I-STAT CG4 LACTIC ACID, ED    EKG  EKG Interpretation  Date/Time:  Thursday June 26 2016 13:22:55 EST Ventricular Rate:  103 PR Interval:    QRS Duration: 111 QT Interval:  393 QTC Calculation: 515 R Axis:   0 Text Interpretation:  Sinus tachycardia Multiform ventricular premature complexes Atrial-sensed ventricular-paced rhythm Since last EKG, PVCs are new Otherwise no significant change Confirmed by Ellender Hose MD, Lysbeth Galas [phone]) on 06/26/2016 4:28:26 PM Also confirmed by Ellender Hose MD, Lysbeth Galas [phone])  on 06/26/2016 7:32:20 PM       Radiology Dg Chest 2 View  Result Date: 06/26/2016 CLINICAL DATA:  Shortness of breath, low oxygen saturation, COPD and dementia EXAM: CHEST  2 VIEW COMPARISON:  Chest x-ray of 05/30/2016 FINDINGS: Moderate cardiomegaly is present with AICD and pacer leads noted. However there are changes of congestive heart failure with small bilateral effusions and pulmonary vascular congestion. No parenchymal infiltrate is seen. Calcified granuloma remains in the left lower lobe superior segment.  The bones are osteopenic. IMPRESSION: 1. Mild to minor CHF with small effusions. 2. Stable cardiomegaly with pacer and AICD leads. Electronically Signed   By: Ivar Drape M.D.   On: 06/26/2016 14:25   Dg Sacrum/coccyx  Result Date: 06/26/2016 CLINICAL DATA:  Recent falls, pain over the sacrum and hips EXAM: SACRUM AND COCCYX - 2+ VIEW COMPARISON:  Lumbar spine films of 02/11/2016 FINDINGS: The sacrococcygeal elements are in normal alignment. No fracture is seen. Hardware for posterior fusion from L4-S1 is noted. The more inferior of the sacral screws is fractured, but that is not an acute process, being present previously. The pelvic rami are intact. The SI joints are corticated. The sacral foramina are unremarkable. IMPRESSION: 1. No sacrococcygeal fracture is seen. 2. Stable posterior fusion from L4-S1. No change in fractured lower sacral screw. Electronically Signed   By: Ivar Drape M.D.   On: 06/26/2016 14:27   Ct Head Wo Contrast  Result Date: 06/26/2016 CLINICAL DATA:  Multiple falls. EXAM: CT HEAD WITHOUT CONTRAST TECHNIQUE: Contiguous axial images were obtained from the base of the skull through the vertex without intravenous contrast. COMPARISON:  06/03/2016 FINDINGS: Brain: There is no evidence for acute hemorrhage, hydrocephalus, mass lesion, or abnormal extra-axial fluid collection. No definite CT evidence for acute infarction. Diffuse loss of parenchymal volume is consistent with atrophy. Patchy low attenuation in the deep hemispheric and periventricular white matter is nonspecific, but likely reflects chronic microvascular ischemic demyelination. Vascular: Atherosclerotic calcification is visualized in the carotid arteries. No dense MCA sign. Major dural sinuses are unremarkable. Skull: No evidence for fracture. No worrisome lytic or sclerotic lesion. Sinuses/Orbits: The visualized paranasal sinuses and mastoid air cells are clear.  Visualized portions of the globes and intraorbital fat are  unremarkable. Other: None. IMPRESSION: 1. Stable.  No acute intracranial abnormality. 2. Atrophy with chronic small vessel white matter ischemic disease. Electronically Signed   By: Misty Stanley M.D.   On: 06/26/2016 14:30   Dg Hips Bilat With Pelvis 2v  Result Date: 06/26/2016 CLINICAL DATA:  Recent falls, sacral and hip pain EXAM: DG HIP (WITH OR WITHOUT PELVIS) 2V BILAT COMPARISON:  Lumbar spine films of 02/11/2016 FINDINGS: There is moderate degenerative joint disease involving both hips with some loss of joint space and sclerosis with spurring. No acute fracture is seen. The pelvic rami are intact. The SI joints appear corticated. IMPRESSION: Moderate degenerative joint disease of the hips. No acute abnormality. Electronically Signed   By: Ivar Drape M.D.   On: 06/26/2016 14:29    Procedures Procedures (including critical care time)  Medications Ordered in ED Medications  ipratropium-albuterol (DUONEB) 0.5-2.5 (3) MG/3ML nebulizer solution 3 mL (3 mLs Nebulization Given 06/26/16 1455)  methylPREDNISolone sodium succinate (SOLU-MEDROL) 125 mg/2 mL injection 125 mg (125 mg Intravenous Given 06/26/16 1455)  aspirin chewable tablet 324 mg (324 mg Oral Given 06/26/16 1604)  fentaNYL (SUBLIMAZE) injection 50 mcg (50 mcg Intravenous Given 06/26/16 1625)  potassium chloride SA (K-DUR,KLOR-CON) CR tablet 40 mEq (40 mEq Oral Given 06/26/16 1625)  furosemide (LASIX) injection 40 mg (40 mg Intravenous Given 06/26/16 1624)     Initial Impression / Assessment and Plan / ED Course  I have reviewed the triage vital signs and the nursing notes.  Pertinent labs & imaging results that were available during my care of the patient were reviewed by me and considered in my medical decision making (see chart for details).  Clinical Course     81 yo F with extensive PMHx including HTN, HLD, CAD, ICM (EF 20%) s/p recent PCI with DES to RCA, COPD here with worsening cough, SOB, and DOE. CXR c//f pulmonary edema  wth b/l infiltrates. Lab work as above, remarkable for mild increase in troponin, elevated BNP. Exam and history most c/w CHF exacerbation. DDx includes COPD and will give single dose steroids, breathing tx. Discussed with Dr. Ellyn Hack. Given recent RHC, placement of DES, and history/exam c/w CHF, he will evaluate in ED. Pt o/w hDS. No apparent fever, leukocytosis, or signs of infection at this time.  D/w Dr. Ellyn Hack - he will write consult note but requests [facility] admission. Cards will follow.   Final Clinical Impressions(s) / ED Diagnoses   Final diagnoses:  Hip pain  Acute on chronic systolic congestive heart failure (HCC)  Elevated brain natriuretic peptide (BNP) level  Elevated troponin    New Prescriptions New Prescriptions   No medications on file     Duffy Bruce, MD 06/26/16 1629    Duffy Bruce, MD 06/26/16 [phone]

## 2016-06-26 NOTE — ED Notes (Addendum)
PT sitting up eating

## 2016-06-26 NOTE — ED Notes (Signed)
Bed: [facility] 2 Expected date:  Expected time:  Means of arrival:  Comments: EMS-SOB/COPD

## 2016-06-27 ENCOUNTER — Encounter (HOSPITAL_COMMUNITY): Payer: Self-pay

## 2016-06-27 ENCOUNTER — Inpatient Hospital Stay (HOSPITAL_COMMUNITY): Payer: Medicare Other

## 2016-06-27 DIAGNOSIS — I509 Heart failure, unspecified: Secondary | ICD-10-CM

## 2016-06-27 DIAGNOSIS — N183 Chronic kidney disease, stage 3 (moderate): Secondary | ICD-10-CM

## 2016-06-27 DIAGNOSIS — I10 Essential (primary) hypertension: Secondary | ICD-10-CM

## 2016-06-27 LAB — GLUCOSE, CAPILLARY
GLUCOSE-CAPILLARY: 181 mg/dL — AB (ref 65–99)
GLUCOSE-CAPILLARY: 139 mg/dL — AB (ref 65–99)
GLUCOSE-CAPILLARY: 149 mg/dL — AB (ref 65–99)
Glucose-Capillary: 136 mg/dL — ABNORMAL HIGH (ref 65–99)
Glucose-Capillary: 185 mg/dL — ABNORMAL HIGH (ref 65–99)

## 2016-06-27 LAB — TROPONIN I
TROPONIN I: 0.07 ng/mL — AB (ref <0.03)
Troponin I: 0.07 ng/mL

## 2016-06-27 LAB — BASIC METABOLIC PANEL WITH GFR
Anion gap: 13 (ref 5–15)
BUN: 14 mg/dL (ref 6–20)
CO2: 23 mmol/L (ref 22–32)
Calcium: 9.1 mg/dL (ref 8.9–10.3)
Chloride: 104 mmol/L (ref 101–111)
Creatinine, Ser: 1.06 mg/dL — ABNORMAL HIGH (ref 0.44–1.00)
GFR calc Af Amer: 57 mL/min — ABNORMAL LOW
GFR calc non Af Amer: 49 mL/min — ABNORMAL LOW
Glucose, Bld: 191 mg/dL — ABNORMAL HIGH (ref 65–99)
Potassium: 3.7 mmol/L (ref 3.5–5.1)
Sodium: 140 mmol/L (ref 135–145)

## 2016-06-27 LAB — LACTIC ACID, PLASMA: Lactic Acid, Venous: 3.9 mmol/L (ref 0.5–1.9)

## 2016-06-27 LAB — ECHOCARDIOGRAM COMPLETE
HEIGHTINCHES: 65 in
WEIGHTICAEL: 2747.81 [oz_av]

## 2016-06-27 MED ORDER — ALBUTEROL SULFATE (2.5 MG/3ML) 0.083% IN NEBU: 2.5000 mg | INHALATION_SOLUTION | RESPIRATORY_TRACT | Status: DC | PRN

## 2016-06-27 MED ORDER — FUROSEMIDE 10 MG/ML IJ SOLN
40.0000 mg | Freq: Two times a day (BID) | INTRAMUSCULAR | Status: DC
  Administered 2016-06-27 – 2016-06-29 (×4): 40 mg via INTRAVENOUS
  Filled 2016-06-27 (×4): qty 4

## 2016-06-27 MED ORDER — FUROSEMIDE 40 MG PO TABS
80.0000 mg | ORAL_TABLET | Freq: Three times a day (TID) | ORAL | Status: DC
  Administered 2016-06-27: 80 mg via ORAL
  Filled 2016-06-27: qty 2

## 2016-06-27 MED ORDER — FUROSEMIDE 10 MG/ML IJ SOLN: 80.0000 mg | Freq: Two times a day (BID) | INTRAMUSCULAR | Status: DC

## 2016-06-27 NOTE — Evaluation (Signed)
Physical Therapy Evaluation Patient Details Name: ARIANYS ARJONA MRN: [id number] DOB: 1936-05-06 Today's Date: 06/27/2016   History of Present Illness  81 y.o. female with chronic systolic heart failure with EF of 20-25% sp/ AICD, CAD with NSTEMI in December 2017 that resulted in DES to the RCA, peripheral vascular disease s/p fem-pop bypass on the right and left carotid endarterectomy and a stent to the left subclavian artery and admitted for Acute on chronic combined systolic and diastolic CHF   Clinical Impression  Pt admitted with above diagnosis. Pt currently with functional limitations due to the deficits listed below (see PT Problem List).  Pt will benefit from skilled PT to increase their independence and safety with mobility to allow discharge to the venue listed below.  Pt ambulated in hallway with RW.  Pt reports she has numerous falls at home which is why she is in hospital.  Plan per chart for SNF.  If home, recommend resuming HHPT.     Follow Up Recommendations Supervision/Assistance - 24 hour;SNF    Equipment Recommendations  None recommended by PT    Recommendations for Other Services       Precautions / Restrictions Precautions Precautions: Fall      Mobility  Bed Mobility Overal bed mobility: Needs Assistance Bed Mobility: Supine to Sit     Supine to sit: Supervision        Transfers Overall transfer level: Needs assistance Equipment used: Rolling walker (2 wheeled) Transfers: Sit to/from Stand Sit to Stand: Min guard         General transfer comment: min/guard for safety  Ambulation/Gait Ambulation/Gait assistance: Min guard Ambulation Distance (Feet): 160 Feet Assistive device: Rolling walker (2 wheeled) Gait Pattern/deviations: Step-through pattern;Decreased stride length     General Gait Details: maintained 3L O2 [place] and Spo2 98%, pt reports mild SOB with activity, distance to tolerance  Stairs            Wheelchair Mobility     Modified Rankin (Stroke Patients Only)       Balance Overall balance assessment: History of Falls (pt reports numerous falls in last month)                                           Pertinent Vitals/Pain Pain Assessment: No/denies pain    Home Living Family/patient expects to be discharged to:: Private residence Living Arrangements: Children (son)   Type of Home: House Home Access: Stairs to enter Entrance Stairs-Rails: Right;Left;Can reach both Entrance Stairs-Number of Steps: 2 Home Layout: One level Home Equipment: Environmental consultant - 2 wheels;Walker - 4 wheels;Cane - single point;Bedside commode;Tub bench;Grab bars - tub/shower      Prior Function Level of Independence: Independent with assistive device(s)         Comments: Uses rollator for ambulation, mainly household ambulator, cooks, cleans.      Hand Dominance        Extremity/Trunk Assessment        Lower Extremity Assessment Lower Extremity Assessment: Generalized weakness       Communication   Communication: HOH  Cognition Arousal/Alertness: Awake/alert Behavior During Therapy: WFL for tasks assessed/performed Overall Cognitive Status: Within Functional Limits for tasks assessed                      General Comments      Exercises     Assessment/Plan  PT Assessment Patient needs continued PT services  PT Problem List Decreased mobility;Decreased activity tolerance;Cardiopulmonary status limiting activity          PT Treatment Interventions DME instruction;Gait training;Therapeutic exercise;Therapeutic activities;Patient/family education;Functional mobility training;Balance training    PT Goals (Current goals can be found in the Care Plan section)  Acute Rehab PT Goals PT Goal Formulation: With patient Time For Goal Achievement: 07/04/16 Potential to Achieve Goals: Good    Frequency Min 3X/week   Barriers to discharge        Co-evaluation                End of Session Equipment Utilized During Treatment: Gait belt;Oxygen Activity Tolerance: Patient tolerated treatment well Patient left: in chair;with call bell/phone within reach;with chair alarm set Nurse Communication: Mobility status         Time: [id number] PT Time Calculation (min) (ACUTE ONLY): 14 min   Charges:   PT Evaluation $PT Eval Low Complexity: 1 Procedure     PT G Codes:        Danah Reinecke,KATHrine E 06/27/2016, 3:35 PM Carmelia Bake, PT, DPT 06/27/2016 Pager: [phone]

## 2016-06-27 NOTE — Progress Notes (Addendum)
Patient Name: Sydney Phillips Date of Encounter: 06/27/2016  Primary Cardiologist: Dr. [facility] Problem List     Principal Problem:   Acute on chronic combined systolic and diastolic CHF (congestive heart failure) ([place]) Active Problems:   Essential hypertension   Biventricular implantable cardioverter-defibrillator in situ   Cardiomyopathy ([place])   CHF exacerbation ([place])   CAD in native artery   Ischemic mitral valve regurgitation   Pulmonary hypertension   Controlled diabetes mellitus type 2 with complications (HCC)   CKD (chronic kidney disease), stage III   Elevated lactic acid level   Chronic respiratory failure with hypoxia (HCC)   Elevated troponin   Acute on chronic systolic heart failure (HCC)    Subjective   SOB improving, not quite back to baseline. Patient yelled at nursing overnight (see note). Pleasant this AM. Reports continued sacral pain. No CP or palpitations. Reports compliance with meds.  Inpatient Medications    . allopurinol  300 mg Oral Daily  . aspirin EC  81 mg Oral Daily  . atorvastatin  40 mg Oral q1800  . bisoprolol  5 mg Oral Daily  . clopidogrel  75 mg Oral Daily  . DULoxetine  60 mg Oral BID  . enoxaparin (LOVENOX) injection  40 mg Subcutaneous Q24H  . furosemide  80 mg Oral TID  . insulin aspart  0-9 Units Subcutaneous TID WC  . ipratropium-albuterol  3 mL Nebulization TID  . lisinopril  5 mg Oral Daily  . potassium chloride  20 mEq Oral BID  . sodium chloride flush  3 mL Intravenous Q12H    Vital Signs    Vitals:   06/26/16 2000 06/26/16 2109 06/26/16 2346 06/27/16 0300  BP: (!) 151/101  128/60   Pulse:   (!) 102   Resp: 19  20   Temp:   97.5 F (36.4 C)   TempSrc:   Oral   SpO2:  97% 92%   Weight:   171 lb 8.3 oz (77.8 kg) 171 lb 11.8 oz (77.9 kg)  Height:   5\' 5"  (1.651 m)     Intake/Output Summary (Last 24 hours) at 06/27/16 1028 Last data filed at 06/27/16 0017  Gross per 24 hour  Intake                 0 ml  Output              250 ml  Net             -250 ml   Filed Weights   06/26/16 2346 06/27/16 0300  Weight: 171 lb 8.3 oz (77.8 kg) 171 lb 11.8 oz (77.9 kg)    Physical Exam    General: Well developed, well nourished WF, in no acute distress. HEENT: Normocephalic, atraumatic, sclera non-icteric, no xanthomas, nares are without discharge. Neck: Negative for carotid bruits. JVP mildly elevated. Lungs: Diminished throughout with bilateral crackles at bases. Breathing is unlabored. Cardiac: RRR S1 S2 without murmurs, rubs, or gallops.  Abdomen: Soft, non-tender, non-distended with normoactive bowel sounds. No rebound/guarding. Extremities: No clubbing or cyanosis. No edema. Distal pedal pulses are 2+ and equal bilaterally. Skin: Warm and dry, no significant rash. Neuro: Alert and oriented X 3. Sensation in tact. Follows commands. Psych:  Responds to questions appropriately with a normal affect.  Labs    CBC  Recent Labs  06/26/16 1347  WBC 8.7  NEUTROABS 5.9  HGB 14.4  HCT 45.0  MCV 90.4  PLT 431*  Basic Metabolic Panel  Recent Labs  06/26/16 1347 06/27/16 0101  NA 142 140  K 3.4* 3.7  CL 105 104  CO2 26 23  GLUCOSE 134* 191*  BUN 6 14  CREATININE 0.86 1.06*  CALCIUM 9.2 9.1   Liver Function Tests  Recent Labs  06/26/16 1347  AST 17  ALT 11*  ALKPHOS 140*  BILITOT 1.5*  PROT 6.9  ALBUMIN 4.0   No results for input(s): LIPASE, AMYLASE in the last 72 hours. Cardiac Enzymes  Recent Labs  06/26/16 2017 06/27/16 0101 06/27/16 0639  TROPONINI 0.09* 0.07* 0.07*    Telemetry    NSR V paced - HR presently 80s  Radiology    Dg Chest 2 View  Result Date: 06/26/2016 CLINICAL DATA:  Shortness of breath, low oxygen saturation, COPD and dementia EXAM: CHEST  2 VIEW COMPARISON:  Chest x-ray of 05/30/2016 FINDINGS: Moderate cardiomegaly is present with AICD and pacer leads noted. However there are changes of congestive heart failure with small  bilateral effusions and pulmonary vascular congestion. No parenchymal infiltrate is seen. Calcified granuloma remains in the left lower lobe superior segment. The bones are osteopenic. IMPRESSION: 1. Mild to minor CHF with small effusions. 2. Stable cardiomegaly with pacer and AICD leads. Electronically Signed   By: Ivar Drape M.D.   On: 06/26/2016 14:25   Dg Chest 2 View  Result Date: 05/30/2016 CLINICAL DATA:  Mental status changes. EXAM: CHEST  2 VIEW COMPARISON:  05/14/2016 FINDINGS: Left AICD remains in place, unchanged. Mild cardiomegaly. No overt edema. Calcified granuloma in the left upper lobe. No confluent opacities or effusions. IMPRESSION: Cardiomegaly.  No active disease. Electronically Signed   By: Rolm Baptise M.D.   On: 05/30/2016 19:19   Dg Sacrum/coccyx  Result Date: 06/26/2016 CLINICAL DATA:  Recent falls, pain over the sacrum and hips EXAM: SACRUM AND COCCYX - 2+ VIEW COMPARISON:  Lumbar spine films of 02/11/2016 FINDINGS: The sacrococcygeal elements are in normal alignment. No fracture is seen. Hardware for posterior fusion from L4-S1 is noted. The more inferior of the sacral screws is fractured, but that is not an acute process, being present previously. The pelvic rami are intact. The SI joints are corticated. The sacral foramina are unremarkable. IMPRESSION: 1. No sacrococcygeal fracture is seen. 2. Stable posterior fusion from L4-S1. No change in fractured lower sacral screw. Electronically Signed   By: Ivar Drape M.D.   On: 06/26/2016 14:27   Ct Head Wo Contrast  Result Date: 06/26/2016 CLINICAL DATA:  Multiple falls. EXAM: CT HEAD WITHOUT CONTRAST TECHNIQUE: Contiguous axial images were obtained from the base of the skull through the vertex without intravenous contrast. COMPARISON:  06/03/2016 FINDINGS: Brain: There is no evidence for acute hemorrhage, hydrocephalus, mass lesion, or abnormal extra-axial fluid collection. No definite CT evidence for acute infarction. Diffuse  loss of parenchymal volume is consistent with atrophy. Patchy low attenuation in the deep hemispheric and periventricular white matter is nonspecific, but likely reflects chronic microvascular ischemic demyelination. Vascular: Atherosclerotic calcification is visualized in the carotid arteries. No dense MCA sign. Major dural sinuses are unremarkable. Skull: No evidence for fracture. No worrisome lytic or sclerotic lesion. Sinuses/Orbits: The visualized paranasal sinuses and mastoid air cells are clear. Visualized portions of the globes and intraorbital fat are unremarkable. Other: None. IMPRESSION: 1. Stable.  No acute intracranial abnormality. 2. Atrophy with chronic small vessel white matter ischemic disease. Electronically Signed   By: Misty Stanley M.D.   On: 06/26/2016 14:30  Ct Head Wo Contrast  Result Date: 06/03/2016 CLINICAL DATA:  Pain following fall EXAM: CT HEAD WITHOUT CONTRAST CT CERVICAL SPINE WITHOUT CONTRAST TECHNIQUE: Multidetector CT imaging of the head and cervical spine was performed following the standard protocol without intravenous contrast. Multiplanar CT image reconstructions of the cervical spine were also generated. COMPARISON:  Head CT May 30, 2016; cervical spine CT May 14, 2016 FINDINGS: CT HEAD FINDINGS Brain: Mild diffuse atrophy is stable. There is no intracranial mass, hemorrhage, extra-axial fluid collection, or midline shift. There is stable patchy small vessel disease throughout the centra semiovale bilaterally. There is no new gray-white compartment lesion. No acute infarct is evident. Vascular: There is no hyperdense vessel. There is calcification in each carotid siphon region. Skull: The bony calvarium appears intact. There is hyperostosis frontalis bilaterally. There is a high left parietal scalp hematoma. Sinuses/Orbits: Visualized paranasal sinuses are clear. Orbits appear symmetric bilaterally. Other: Mastoid air cells are clear. CT CERVICAL SPINE FINDINGS  Alignment: There is minimal anterolisthesis of C4 on C5, stable. No new spondylolisthesis. There is cervicothoracic levoscoliosis. Skull base and vertebrae: The skull base and 1 craniovertebral junction regions appear unremarkable 2. There is no demonstrable fracture. No blastic or lytic bone lesions are evident. Soft tissues and spinal canal: Prevertebral soft tissues and predental space regions are normal. There is no paraspinous lesion or high-grade spinal stenosis. Disc levels: There is mild to moderate disc space narrowing at C5-6 and C6-7. There is more severe disc space narrowing visualized upper thoracic region. There is facet hypertrophy at multiple levels bilaterally. There is exit foraminal narrowing due to bony hypertrophy at C3-4 bilaterally, at C4-5 on the left, and at at C5-6 on the right, with narrowing and impression on the exiting nerve root most marked at C5-6 on the right and at C4-5 on the left. No well-defined disc extrusion is seen on this noncontrast enhanced study. At C5-6 on the right, there is asymmetric calcified disc protrusion. Upper chest: Visualized lung bases are clear. Other: There is calcification in each carotid artery. There is a 7 mm nodular opacity arising from the left lobe of the thyroid. IMPRESSION: CT head: Atrophy with patchy small vessel disease in the supratentorial white matter bilaterally. No intracranial mass hemorrhage, or acute appearing infarct. No midline shift or extra-axial fluid. High left parietal scalp hematoma. CT cervical spine: No fracture. Minimal anterolisthesis of C4 on C5, felt to be due to underlying spondylosis. Multilevel arthropathy as summarized above. Calcification in each carotid artery. Subcentimeter left lobe thyroid nodule. Electronically Signed   By: Lowella Grip III M.D.   On: 06/03/2016 08:56   Ct Head Wo Contrast  Result Date: 05/30/2016 CLINICAL DATA:  Altered mental status. EXAM: CT HEAD WITHOUT CONTRAST TECHNIQUE: Contiguous  axial images were obtained from the base of the skull through the vertex without intravenous contrast. COMPARISON:  05/14/2016. FINDINGS: Brain: Diffusely enlarged ventricles and subarachnoid spaces. Patchy white matter low density in both cerebral hemispheres. No intracranial hemorrhage, mass lesion or CT evidence of acute infarction. Vascular: No hyperdense vessel or unexpected calcification. Skull: Bilateral hyperostosis frontalis. Sinuses/Orbits: No acute finding. Other: None. IMPRESSION: No acute abnormality. Stable atrophy and chronic small vessel white matter ischemic changes. Electronically Signed   By: Claudie Revering M.D.   On: 05/30/2016 19:10   Ct Cervical Spine Wo Contrast  Result Date: 06/03/2016 CLINICAL DATA:  Pain following fall EXAM: CT HEAD WITHOUT CONTRAST CT CERVICAL SPINE WITHOUT CONTRAST TECHNIQUE: Multidetector CT imaging of the head and cervical  spine was performed following the standard protocol without intravenous contrast. Multiplanar CT image reconstructions of the cervical spine were also generated. COMPARISON:  Head CT May 30, 2016; cervical spine CT May 14, 2016 FINDINGS: CT HEAD FINDINGS Brain: Mild diffuse atrophy is stable. There is no intracranial mass, hemorrhage, extra-axial fluid collection, or midline shift. There is stable patchy small vessel disease throughout the centra semiovale bilaterally. There is no new gray-white compartment lesion. No acute infarct is evident. Vascular: There is no hyperdense vessel. There is calcification in each carotid siphon region. Skull: The bony calvarium appears intact. There is hyperostosis frontalis bilaterally. There is a high left parietal scalp hematoma. Sinuses/Orbits: Visualized paranasal sinuses are clear. Orbits appear symmetric bilaterally. Other: Mastoid air cells are clear. CT CERVICAL SPINE FINDINGS Alignment: There is minimal anterolisthesis of C4 on C5, stable. No new spondylolisthesis. There is cervicothoracic  levoscoliosis. Skull base and vertebrae: The skull base and 1 craniovertebral junction regions appear unremarkable 2. There is no demonstrable fracture. No blastic or lytic bone lesions are evident. Soft tissues and spinal canal: Prevertebral soft tissues and predental space regions are normal. There is no paraspinous lesion or high-grade spinal stenosis. Disc levels: There is mild to moderate disc space narrowing at C5-6 and C6-7. There is more severe disc space narrowing visualized upper thoracic region. There is facet hypertrophy at multiple levels bilaterally. There is exit foraminal narrowing due to bony hypertrophy at C3-4 bilaterally, at C4-5 on the left, and at at C5-6 on the right, with narrowing and impression on the exiting nerve root most marked at C5-6 on the right and at C4-5 on the left. No well-defined disc extrusion is seen on this noncontrast enhanced study. At C5-6 on the right, there is asymmetric calcified disc protrusion. Upper chest: Visualized lung bases are clear. Other: There is calcification in each carotid artery. There is a 7 mm nodular opacity arising from the left lobe of the thyroid. IMPRESSION: CT head: Atrophy with patchy small vessel disease in the supratentorial white matter bilaterally. No intracranial mass hemorrhage, or acute appearing infarct. No midline shift or extra-axial fluid. High left parietal scalp hematoma. CT cervical spine: No fracture. Minimal anterolisthesis of C4 on C5, felt to be due to underlying spondylosis. Multilevel arthropathy as summarized above. Calcification in each carotid artery. Subcentimeter left lobe thyroid nodule. Electronically Signed   By: Lowella Grip III M.D.   On: 06/03/2016 08:56   Dg Hips Bilat With Pelvis 2v  Result Date: 06/26/2016 CLINICAL DATA:  Recent falls, sacral and hip pain EXAM: DG HIP (WITH OR WITHOUT PELVIS) 2V BILAT COMPARISON:  Lumbar spine films of 02/11/2016 FINDINGS: There is moderate degenerative joint disease  involving both hips with some loss of joint space and sclerosis with spurring. No acute fracture is seen. The pelvic rami are intact. The SI joints appear corticated. IMPRESSION: Moderate degenerative joint disease of the hips. No acute abnormality. Electronically Signed   By: Ivar Drape M.D.   On: 06/26/2016 14:29     Patient Profile     76F with CAD (NSTEMI s/p DES to RCA/Cx 05/2016), COPD with chronic respiratory failure, OSA, HTN, HLD, LBBB, h/o mixed cardiomyopathy s/p [employer] CRT-D (original ICD 2009, s/p device change out 2013), chronic combined CHF, carotid artery disease s/p L CEA, history of L subclavian artery stenosis s/p stent in February 2013, history of lower extremity PADs/p right fem-pop 2015, chronic pain, DM, CKD stage III, tobacco abuse, recent fall who presented to [facility] with worsening SOB  and sacral pain  Assessment & Plan    1. Acute on chronic combined CHF with MR - continue current regimen. Prior baseline weight around 163lb.  2. Lactic acidosis - unclear etiology, per IM.  3. CAD s/p PCI 05/2016 with minimally elevated troponin - no recent anginal chest pain. Troponin trend is quite flat, arguing against ACS. I would expect rising troponin rather than improving level if this represented an acute issue with stent. Follow symptoms with diuresis. If she has persistent dyspnea beyond euvolemia and optimization of pulmonary status, may consider relook cath versus myocardial perfusion study to assess residual moderate ostial LAD disease. I will review DAPT with Dr. Ellyn Hack to clarify - she was discharged on ASA/Brilinta in the past but clopidogrel is back on her home med list. She is presently written to continue ASA/Plavix.   4. CKD stage III - follow. Suspect baseline Cr will have to run slightly elevated to maintain euvolemia.  5. Social situation - may need to consider going back to SNF. Notes indicate some evasiveness with home health services.  6. HTN - follow blood  pressure with diuresis. Have asked tech to check this AM. If pressure is trending downwards towards controlled, may consider holding ACEI while diuresing given her renal dysfunction.  7. Sacral pain - per IM. Related to recent fall  Signed, Charlie Pitter, PA-C  06/27/2016, 10:28 AM   I have seen, examined and evaluated the patient this AM along with Melina Copa, PA-C.  After reviewing all the available data and chart, we discussed the patients laboratory, study & physical findings as well as symptoms in detail. I agree with her findings, examination as well as impression recommendations as per our discussion.    Overall, she does seem to have improved. She is not having any anginal symptoms, but still has dyspnea - she is improving with diuresis, but by her dry weight is still above her target. Her dry weight at last time was roughly 168-169 pounds.  I agree that we will need to accept better creatinine will likely be in the range of C KD 1-2 in order for her to be euvolemic from a pulmonary standpoint. She probably does have underlying chronic renal insufficiency, and the creatinine of 0.9 is probably more indicative of volume overload then better kidney function.  Agree with current with potentially holding ACE inhibitor if her blood pressure goes down based on concerns or renal function.  100% agree with social situation discussion.  We will follow along over the weekend.  Glenetta Hew, M.D., M.S. Interventional Cardiologist   Pager # [phone] Phone # [phone] [address] [place], [place] [address]

## 2016-06-27 NOTE — Progress Notes (Signed)
PROGRESS NOTE  Sydney Phillips  [id number] DOB: 04-01-1936 DOA: 06/26/2016 PCP: Alysia Penna, MD  Brief Narrative:   Sydney Phillips is a 81 y.o. female with chronic systolic heart failure with EF of 20-25% sp/ AICD, CAD with NSTEMI in December 2017 that resulted in DES to the RCA, peripheral vascular disease s/p fem-pop bypass on the right and left carotid endarterectomy and a stent to the left subclavian artery.  She has COPD, diet controlled diabetes mellitus, GERD, dementia, and depression.  She presented with increasing weakness with recurrent falls, orthopnea, PND, and urinary incontinence.  In the ER, she was hypoxic and tachypneic but improved on 2-3 L [place].  Her BNP was elevated from baseline and CXR demonstrated pulmonary edema with bilateral pleural effusions consistent with acute on chronic sHF.  Troponin was 0.12 and trended down to 0.07, minimally elevated and patient without chest pain.  She was started on lasix and her breathing has improved some.  Ongoing diuresis and awaiting PT evaluation.  Cardiology assisting with management and considering additional investigation of CA with stress test or LHC given mildly elevated troponin.    Assessment & Plan:   Principal Problem:   Acute on chronic combined systolic and diastolic CHF (congestive heart failure) (HCC) Active Problems:   Essential hypertension   Biventricular implantable cardioverter-defibrillator in situ   Cardiomyopathy ([place])   CHF exacerbation (HCC)   CAD in native artery   Ischemic mitral valve regurgitation   Pulmonary hypertension   Controlled diabetes mellitus type 2 with complications (HCC)   CKD (chronic kidney disease), stage III   Elevated lactic acid level   Chronic respiratory failure with hypoxia (HCC)   Elevated troponin   Acute on chronic systolic heart failure (HCC)  Acute respiratory failure with hypoxia secondary to acute on chronic systolic heart failure likely due to lasix not being  resumed.  -  Telemetry:  Paced rhythm  -  continue Lasix 40mg  IV BID -  Daily weights, stable -  place foley for strict I/O -  Daily potassium -  BUN and creatinine starting to rise slightly but also started ACEI -  Continue lisinopril today  Elevated troponin, likely due to demand ischemia secondary to heart failure exacerbation, but does have CAD.  Suspect troponin would be much higher if this were in-stent thrombosis.   -  Troponins 0.12 >> 0.07 -  Continue plavix and add aspirin 81mg  daily -  Continue beta blocker and statin -  Cardiology consult appreciated -  ECHO:  EF appears stable, mild to mod MR  Lactic acidosis likely due to CHF.  rising despite some diuresis  Gout, stable, continue allopurinol  COPD.  CXR consistent with heart failrue -  Continue duonebs   Anxiety and depression, stable -  Continue cymbalta and xanax  Chronic pain, stable, continue norco prn  Diabetes mellitus type 2, A1c 6.9 -  Low dose SSI  DVT prophylaxis: lovenox  Code Status:  DNR Family Communication: patient alone  Disposition Plan: likely to SNF in 3-4 days    Consultants:   Cardiology  Procedures:  ECHO: Left ventricle: The cavity size was severely dilated. Wall   thickness was normal. Systolic function was severely reduced. The   estimated ejection fraction was in the range of 20% to 25%.   Diffuse hypokinesis. Doppler parameters are consistent with both   elevated ventricular end-diastolic filling pressure and elevated   left atrial filling pressure. - Aortic valve: Valve area (VTI): 2.05 cm^2. Valve  area (Vmax):   1.93 cm^2. Valve area (Vmean): 1.99 cm^2. - Mitral valve: There was moderate regurgitation. Valve area by   continuity equation (using LVOT flow): 2.19 cm^2. - Left atrium: The atrium was moderately dilated. - Atrial septum: No defect or patent foramen ovale was identified. - Pulmonary arteries: PA peak pressure: 53 mm Hg (S).   Antimicrobials:    Anti-infectives    None       Subjective:  Feeling a little better this morning.  Breathing is easier.  Denies chest pains.  Voided a lot overnight.    Objective: Vitals:   06/26/16 2346 06/27/16 0300 06/27/16 1051 06/27/16 1345  BP: 128/60  137/78   Pulse: (!) 102  85   Resp: 20  20   Temp: 97.5 F (36.4 C)     TempSrc: Oral  Oral   SpO2: 92%  98% 98%  Weight: 77.8 kg (171 lb 8.3 oz) 77.9 kg (171 lb 11.8 oz)    Height: 5\' 5"  (1.651 m)       Intake/Output Summary (Last 24 hours) at 06/27/16 1348 Last data filed at 06/27/16 1340  Gross per 24 hour  Intake              240 ml  Output             1100 ml  Net             -860 ml   Filed Weights   06/26/16 2346 06/27/16 0300  Weight: 77.8 kg (171 lb 8.3 oz) 77.9 kg (171 lb 11.8 oz)    Examination:  General exam:  Adult female.  No acute distress.  HEENT:  NCAT, MMM Respiratory system: wheezing, no focal rales or rhonchi Cardiovascular system: Regular rate and rhythm, normal S1/S2.  Warm extremities Gastrointestinal system: Normal active bowel sounds, soft, nondistended, nontender. MSK:  Normal tone and bulk, no lower extremity edema Neuro:  Grossly intact    Data Reviewed: I have personally reviewed following labs and imaging studies  CBC:  Recent Labs Lab 06/20/16 1526 06/26/16 1347  WBC 7.1 8.7  NEUTROABS 4.6 5.9  HGB 13.5 14.4  HCT 42.0 45.0  MCV 91.9 90.4  PLT 311.0 99991111*   Basic Metabolic Panel:  Recent Labs Lab 06/20/16 1526 06/26/16 1347 06/27/16 0101  NA 144 142 140  K 3.8 3.4* 3.7  CL 105 105 104  CO2 31 26 23   GLUCOSE 182* 134* 191*  BUN 7 6 14   CREATININE 0.83 0.86 1.06*  CALCIUM 9.2 9.2 9.1   GFR: Estimated Creatinine Clearance: 45.2 mL/min (by C-G formula based on SCr of 1.06 mg/dL (H)). Liver Function Tests:  Recent Labs Lab 06/26/16 1347  AST 17  ALT 11*  ALKPHOS 140*  BILITOT 1.5*  PROT 6.9  ALBUMIN 4.0   No results for input(s): LIPASE, AMYLASE in the last 168  hours. No results for input(s): AMMONIA in the last 168 hours. Coagulation Profile: No results for input(s): INR, PROTIME in the last 168 hours. Cardiac Enzymes:  Recent Labs Lab 06/26/16 1347 06/26/16 2017 06/27/16 0101 06/27/16 0639  TROPONINI 0.12* 0.09* 0.07* 0.07*   BNP (last 3 results) No results for input(s): PROBNP in the last 8760 hours. HbA1C: No results for input(s): HGBA1C in the last 72 hours. CBG:  Recent Labs Lab 06/27/16 0102 06/27/16 0906 06/27/16 1122  GLUCAP 181* 139* 149*   Lipid Profile: No results for input(s): CHOL, HDL, LDLCALC, TRIG, CHOLHDL, LDLDIRECT in the last 72 hours.  Thyroid Function Tests: No results for input(s): TSH, T4TOTAL, FREET4, T3FREE, THYROIDAB in the last 72 hours. Anemia Panel: No results for input(s): VITAMINB12, FOLATE, FERRITIN, TIBC, IRON, RETICCTPCT in the last 72 hours. Urine analysis:    Component Value Date/Time   COLORURINE YELLOW 06/26/2016 1601   APPEARANCEUR CLEAR 06/26/2016 1601   LABSPEC 1.008 06/26/2016 1601   PHURINE 7.0 06/26/2016 1601   GLUCOSEU NEGATIVE 06/26/2016 1601   HGBUR NEGATIVE 06/26/2016 1601   HGBUR negative 01/01/2010 1353   BILIRUBINUR NEGATIVE 06/26/2016 1601   BILIRUBINUR n 05/30/2015 1542   KETONESUR NEGATIVE 06/26/2016 1601   PROTEINUR 30 (A) 06/26/2016 1601   UROBILINOGEN 0.2 05/30/2015 1542   UROBILINOGEN 1.0 11/03/2014 1442   NITRITE NEGATIVE 06/26/2016 1601   LEUKOCYTESUR NEGATIVE 06/26/2016 1601   Sepsis Labs: @LABRCNTIP (procalcitonin:4,lacticidven:4)  )No results found for this or any previous visit (from the past 240 hour(s)).    Radiology Studies: Dg Chest 2 View  Result Date: 06/26/2016 CLINICAL DATA:  Shortness of breath, low oxygen saturation, COPD and dementia EXAM: CHEST  2 VIEW COMPARISON:  Chest x-ray of 05/30/2016 FINDINGS: Moderate cardiomegaly is present with AICD and pacer leads noted. However there are changes of congestive heart failure with small bilateral  effusions and pulmonary vascular congestion. No parenchymal infiltrate is seen. Calcified granuloma remains in the left lower lobe superior segment. The bones are osteopenic. IMPRESSION: 1. Mild to minor CHF with small effusions. 2. Stable cardiomegaly with pacer and AICD leads. Electronically Signed   By: Ivar Drape M.D.   On: 06/26/2016 14:25   Dg Sacrum/coccyx  Result Date: 06/26/2016 CLINICAL DATA:  Recent falls, pain over the sacrum and hips EXAM: SACRUM AND COCCYX - 2+ VIEW COMPARISON:  Lumbar spine films of 02/11/2016 FINDINGS: The sacrococcygeal elements are in normal alignment. No fracture is seen. Hardware for posterior fusion from L4-S1 is noted. The more inferior of the sacral screws is fractured, but that is not an acute process, being present previously. The pelvic rami are intact. The SI joints are corticated. The sacral foramina are unremarkable. IMPRESSION: 1. No sacrococcygeal fracture is seen. 2. Stable posterior fusion from L4-S1. No change in fractured lower sacral screw. Electronically Signed   By: Ivar Drape M.D.   On: 06/26/2016 14:27   Ct Head Wo Contrast  Result Date: 06/26/2016 CLINICAL DATA:  Multiple falls. EXAM: CT HEAD WITHOUT CONTRAST TECHNIQUE: Contiguous axial images were obtained from the base of the skull through the vertex without intravenous contrast. COMPARISON:  06/03/2016 FINDINGS: Brain: There is no evidence for acute hemorrhage, hydrocephalus, mass lesion, or abnormal extra-axial fluid collection. No definite CT evidence for acute infarction. Diffuse loss of parenchymal volume is consistent with atrophy. Patchy low attenuation in the deep hemispheric and periventricular white matter is nonspecific, but likely reflects chronic microvascular ischemic demyelination. Vascular: Atherosclerotic calcification is visualized in the carotid arteries. No dense MCA sign. Major dural sinuses are unremarkable. Skull: No evidence for fracture. No worrisome lytic or sclerotic  lesion. Sinuses/Orbits: The visualized paranasal sinuses and mastoid air cells are clear. Visualized portions of the globes and intraorbital fat are unremarkable. Other: None. IMPRESSION: 1. Stable.  No acute intracranial abnormality. 2. Atrophy with chronic small vessel white matter ischemic disease. Electronically Signed   By: Misty Stanley M.D.   On: 06/26/2016 14:30   Dg Hips Bilat With Pelvis 2v  Result Date: 06/26/2016 CLINICAL DATA:  Recent falls, sacral and hip pain EXAM: DG HIP (WITH OR WITHOUT PELVIS) 2V BILAT COMPARISON:  Lumbar  spine films of 02/11/2016 FINDINGS: There is moderate degenerative joint disease involving both hips with some loss of joint space and sclerosis with spurring. No acute fracture is seen. The pelvic rami are intact. The SI joints appear corticated. IMPRESSION: Moderate degenerative joint disease of the hips. No acute abnormality. Electronically Signed   By: Ivar Drape M.D.   On: 06/26/2016 14:29     Scheduled Meds: . allopurinol  300 mg Oral Daily  . aspirin EC  81 mg Oral Daily  . atorvastatin  40 mg Oral q1800  . bisoprolol  5 mg Oral Daily  . clopidogrel  75 mg Oral Daily  . DULoxetine  60 mg Oral BID  . enoxaparin (LOVENOX) injection  40 mg Subcutaneous Q24H  . furosemide  40 mg Intravenous BID  . insulin aspart  0-9 Units Subcutaneous TID WC  . ipratropium-albuterol  3 mL Nebulization TID  . lisinopril  5 mg Oral Daily  . potassium chloride  20 mEq Oral BID  . sodium chloride flush  3 mL Intravenous Q12H   Continuous Infusions:   LOS: 1 day    Time spent: 30 min    Janece Canterbury, MD [facility] Pager [phone]  If 7PM-7AM, please contact night-coverage [email or web address] Password [email or web address] 06/27/2016, 1:48 PM

## 2016-06-27 NOTE — Progress Notes (Signed)
PTs IV site became occluded. Pt refused to let nurse replace IV. Yelling and telling nurse to leave her alone. Pt is on Tele.

## 2016-06-27 NOTE — Progress Notes (Signed)
  Echocardiogram 2D Echocardiogram has been performed.  Tresa Res 06/27/2016, 11:39 AM

## 2016-06-27 NOTE — Care Management Note (Signed)
Case Management Note  Patient Details  Name: Sydney Phillips MRN: [id number] Date of Birth: 1935/07/04  Subjective/Objective:    Shortness of breath and urinary incontinence.                Action/Plan:  Pt was active with [facility], plan to continue.   Expected Discharge Date:  06/29/16               Expected Discharge Plan:     In-House Referral:   CM  Discharge planning Services     Post Acute Care Choice:    Choice offered to:     DME Arranged:    DME Agency:     HH Arranged:   [place] Agency:   [facility] [phone]  Status of Service:     If discussed at [place] of Stay Meetings, dates discussed:    Additional CommentsPurcell Mouton, RN 06/27/2016, 1:12 PM

## 2016-06-28 LAB — BASIC METABOLIC PANEL
ANION GAP: 8 (ref 5–15)
BUN: 26 mg/dL — ABNORMAL HIGH (ref 6–20)
CO2: 30 mmol/L (ref 22–32)
Calcium: 8.5 mg/dL — ABNORMAL LOW (ref 8.9–10.3)
Chloride: 103 mmol/L (ref 101–111)
Creatinine, Ser: 1.01 mg/dL — ABNORMAL HIGH (ref 0.44–1.00)
GFR calc Af Amer: >60 mL/min (ref >60)
GFR, EST NON AFRICAN AMERICAN: 52 mL/min — AB (ref >60)
Glucose, Bld: 100 mg/dL — ABNORMAL HIGH (ref 65–99)
POTASSIUM: 3.7 mmol/L (ref 3.5–5.1)
SODIUM: 141 mmol/L (ref 135–145)

## 2016-06-28 LAB — GLUCOSE, CAPILLARY
GLUCOSE-CAPILLARY: 102 mg/dL — AB (ref 65–99)
GLUCOSE-CAPILLARY: 170 mg/dL — AB (ref 65–99)
GLUCOSE-CAPILLARY: 104 mg/dL — AB (ref 65–99)
GLUCOSE-CAPILLARY: 140 mg/dL — AB (ref 65–99)

## 2016-06-28 NOTE — Progress Notes (Signed)
Subjective:    SOB is improving.   Objective:   Temp:  [97.5 F (36.4 C)-98.7 F (37.1 C)] 97.5 F (36.4 C) (01/13 0649) Pulse Rate:  [79-86] 86 (01/12 2047) Resp:  [17-20] 17 (01/13 0649) BP: (125-138)/(65-78) 136/67 (01/13 0649) SpO2:  [98 %] 98 % (01/13 0649) FiO2 (%):  [32 %] 32 % (01/12 1345) Weight:  [172 lb 2.9 oz (78.1 kg)] 172 lb 2.9 oz (78.1 kg) (01/13 0500) Last BM Date: 06/26/16  Filed Weights   06/26/16 2346 06/27/16 0300 06/28/16 0500  Weight: 171 lb 8.3 oz (77.8 kg) 171 lb 11.8 oz (77.9 kg) 172 lb 2.9 oz (78.1 kg)    Intake/Output Summary (Last 24 hours) at 06/28/16 0752 Last data filed at 06/28/16 0600  Gross per 24 hour  Intake              240 ml  Output             3250 ml  Net            -3010 ml    Telemetry: V-paced  Exam:  General: NAD  HEENT: sclera clear, throat clear  Resp: mild crackles bilateral bases  Cardiac: RRR, 2/6 systolic murmur at apex, no jvd  GI: abdomen soft, NT, ND  MSK: no LE edema  Neuro: no focal deficits  Psych: appropriate affect  Lab Results:  Basic Metabolic Panel:  Recent Labs Lab 06/26/16 1347 06/27/16 0101 06/28/16 0506  NA 142 140 141  K 3.4* 3.7 3.7  CL 105 104 103  CO2 26 23 30   GLUCOSE 134* 191* 100*  BUN 6 14 26*  CREATININE 0.86 1.06* 1.01*  CALCIUM 9.2 9.1 8.5*    Liver Function Tests:  Recent Labs Lab 06/26/16 1347  AST 17  ALT 11*  ALKPHOS 140*  BILITOT 1.5*  PROT 6.9  ALBUMIN 4.0    CBC:  Recent Labs Lab 06/26/16 1347  WBC 8.7  HGB 14.4  HCT 45.0  MCV 90.4  PLT 431*    Cardiac Enzymes:  Recent Labs Lab 06/26/16 2017 06/27/16 0101 06/27/16 0639  TROPONINI 0.09* 0.07* 0.07*    BNP: No results for input(s): PROBNP in the last 8760 hours.  Coagulation: No results for input(s): INR in the last 168 hours.  ECG:   Medications:   Scheduled Medications: . allopurinol  300 mg Oral Daily  . aspirin EC  81 mg Oral Daily  . atorvastatin  40 mg  Oral q1800  . bisoprolol  5 mg Oral Daily  . clopidogrel  75 mg Oral Daily  . DULoxetine  60 mg Oral BID  . enoxaparin (LOVENOX) injection  40 mg Subcutaneous Q24H  . furosemide  40 mg Intravenous BID  . insulin aspart  0-9 Units Subcutaneous TID WC  . ipratropium-albuterol  3 mL Nebulization TID  . lisinopril  5 mg Oral Daily  . potassium chloride  20 mEq Oral BID  . sodium chloride flush  3 mL Intravenous Q12H     Infusions:   PRN Medications:  sodium chloride, acetaminophen, albuterol, ALPRAZolam, fluticasone, nitroGLYCERIN, ondansetron (ZOFRAN) IV, sodium chloride flush     Assessment/Plan    1. Acute on chronic combined CHF with MR - echo 06/2016 shows LVEF 20-25%, abnormal diastolic function, moderate MR - cath 05/2016 RCA and LCX disease, both received a DES. Moderate pulm HTN - currently on lasix IV 40mg  bid. Negative 2.4 liters yesterday, negative 3 liters since admission. Mild uptrend in  Cr fairly stable from yesterday.  - medical therapy with bisoprolol 5, lisinopril 5mg  daily.  - continue IV diuresis today.   2. CAD -cath 05/2016 RCA and LCX disease, both received a DES.  - continue asa, plavix, atorva, bisop,lisionpril - flat nonspecific troponin this admission without chest pain in setting of CHF, not consistent with ACS.       Carlyle Dolly, M.D., F.A.C.C.Patient ID: Sydney Phillips, female   DOB: 1935/11/23, 81 y.o.   MRN: [id number]

## 2016-06-28 NOTE — Progress Notes (Signed)
PROGRESS NOTE  Sydney Phillips  [id number] DOB: May 15, 1936 DOA: 06/26/2016 PCP: Alysia Penna, MD  Brief Narrative:   Sydney Phillips is a 80 y.o. female with chronic systolic heart failure with EF of 20-25% sp/ AICD, CAD with NSTEMI in December 2017 that resulted in DES to the RCA, peripheral vascular disease s/p fem-pop bypass on the right and left carotid endarterectomy and a stent to the left subclavian artery.  She has COPD, diet controlled diabetes mellitus, GERD, dementia, and depression.  She presented with increasing weakness with recurrent falls, orthopnea, PND, and urinary incontinence.  In the ER, she was hypoxic and tachypneic but improved on 2-3 L [place].  Her BNP was elevated from baseline and CXR demonstrated pulmonary edema with bilateral pleural effusions consistent with acute on chronic sHF.  Troponin was 0.12 and trended down to 0.07, minimally elevated and patient without chest pain.  She was started on lasix and her breathing has improved some.   Cardiology assisting with management.  Assessment & Plan:   Principal Problem:   Acute on chronic combined systolic and diastolic CHF (congestive heart failure) (HCC) Active Problems:   Essential hypertension   Biventricular implantable cardioverter-defibrillator in situ   Cardiomyopathy ([place])   CHF exacerbation (HCC)   CAD in native artery   Ischemic mitral valve regurgitation   Pulmonary hypertension   Controlled diabetes mellitus type 2 with complications (HCC)   CKD (chronic kidney disease), stage III   Elevated lactic acid level   Chronic respiratory failure with hypoxia (HCC)   Elevated troponin   Acute on chronic systolic heart failure (HCC)  Acute respiratory failure with hypoxia secondary to acute on chronic systolic heart failure, continues to improve.  Normally on room air and currently on 3.5L [place].   -  Telemetry:  Paced rhythm  -  continue Lasix 40mg  IV BID -  Daily weights, per records, wt increased -   Neg 3L  -  continue foley for strict I/O -  continue daily potassium -  BUN and creatinine starting to rise slightly but also started ACEI -  Continue lisinopril   Elevated troponin, likely due to demand ischemia secondary to heart failure exacerbation, but does have CAD.  Suspect troponin would be much higher if this were in-stent thrombosis.   -  Troponins 0.12 >> 0.07 -  Continue plavix and add aspirin 81mg  daily -  Continue beta blocker and statin -  Cardiology consult appreciated -  ECHO:  EF appears stable, mild to mod MR  Lactic acidosis likely due to CHF.  rising despite some diuresis  Gout, stable, continue allopurinol  COPD.  CXR consistent with heart failrue -  Continue duonebs   Anxiety and depression, stable -  Continue cymbalta and xanax  Chronic pain, stable, continue norco prn  Diabetes mellitus type 2, A1c 6.9 -  Low dose SSI  Generalized weakness and falls -  PT recommending SNF -  SW consult placed  DVT prophylaxis: lovenox  Code Status:  DNR Family Communication: patient alone  Disposition Plan: likely to SNF in 2-3 days.  At risk for decompensation due to recent NSTEMI, underlying CAD and NYHA class III/IV CHF.   Consultants:   Cardiology  Procedures:  ECHO: Left ventricle: The cavity size was severely dilated. Wall   thickness was normal. Systolic function was severely reduced. The   estimated ejection fraction was in the range of 20% to 25%.   Diffuse hypokinesis. Doppler parameters are consistent with both  elevated ventricular end-diastolic filling pressure and elevated   left atrial filling pressure. - Aortic valve: Valve area (VTI): 2.05 cm^2. Valve area (Vmax):   1.93 cm^2. Valve area (Vmean): 1.99 cm^2. - Mitral valve: There was moderate regurgitation. Valve area by   continuity equation (using LVOT flow): 2.19 cm^2. - Left atrium: The atrium was moderately dilated. - Atrial septum: No defect or patent foramen ovale was  identified. - Pulmonary arteries: PA peak pressure: 53 mm Hg (S).   Antimicrobials:  Anti-infectives    None       Subjective:  Feeling a little better this morning. Denies chest pains.  Voided a lot overnight.  Able to lie more flat today.    Objective: Vitals:   06/28/16 0500 06/28/16 0649 06/28/16 0935 06/28/16 0947  BP:  136/67  135/62  Pulse:      Resp:  17    Temp:  97.5 F (36.4 C)    TempSrc:  Oral    SpO2:  98% 95%   Weight: 78.1 kg (172 lb 2.9 oz)     Height:        Intake/Output Summary (Last 24 hours) at 06/28/16 1320 Last data filed at 06/28/16 1050  Gross per 24 hour  Intake                0 ml  Output             4250 ml  Net            -4250 ml   Filed Weights   06/26/16 2346 06/27/16 0300 06/28/16 0500  Weight: 77.8 kg (171 lb 8.3 oz) 77.9 kg (171 lb 11.8 oz) 78.1 kg (172 lb 2.9 oz)    Examination:  General exam:  Adult female.  No acute distress.  HEENT:  NCAT, MMM Respiratory system: wheezing with rales at the bilateral bases, no rhonchi Cardiovascular system: Regular rate and rhythm, normal S1/S2.  Warm extremities Gastrointestinal system: Normal active bowel sounds, soft, nondistended, nontender. MSK:  Normal tone and bulk, no lower extremity edema Neuro:  Grossly intact    Data Reviewed: I have personally reviewed following labs and imaging studies  CBC:  Recent Labs Lab 06/26/16 1347  WBC 8.7  NEUTROABS 5.9  HGB 14.4  HCT 45.0  MCV 90.4  PLT [id number]*   Basic Metabolic Panel:  Recent Labs Lab 06/26/16 1347 06/27/16 0101 06/28/16 0506  NA 142 140 141  K 3.4* 3.7 3.7  CL 105 104 103  CO2 26 23 30   GLUCOSE 134* 191* 100*  BUN 6 14 26*  CREATININE 0.86 1.06* 1.01*  CALCIUM 9.2 9.1 8.5*   GFR: Estimated Creatinine Clearance: 47.4 mL/min (by C-G formula based on SCr of 1.01 mg/dL (H)). Liver Function Tests:  Recent Labs Lab 06/26/16 1347  AST 17  ALT 11*  ALKPHOS 140*  BILITOT 1.5*  PROT 6.9  ALBUMIN 4.0   No  results for input(s): LIPASE, AMYLASE in the last 168 hours. No results for input(s): AMMONIA in the last 168 hours. Coagulation Profile: No results for input(s): INR, PROTIME in the last 168 hours. Cardiac Enzymes:  Recent Labs Lab 06/26/16 1347 06/26/16 2017 06/27/16 0101 06/27/16 0639  TROPONINI 0.12* 0.09* 0.07* 0.07*   BNP (last 3 results) No results for input(s): PROBNP in the last 8760 hours. HbA1C: No results for input(s): HGBA1C in the last 72 hours. CBG:  Recent Labs Lab 06/27/16 1122 06/27/16 1715 06/27/16 2124 06/28/16 0808 06/28/16 1153  GLUCAP  149* 136* 185* 102* 170*   Lipid Profile: No results for input(s): CHOL, HDL, LDLCALC, TRIG, CHOLHDL, LDLDIRECT in the last 72 hours. Thyroid Function Tests: No results for input(s): TSH, T4TOTAL, FREET4, T3FREE, THYROIDAB in the last 72 hours. Anemia Panel: No results for input(s): VITAMINB12, FOLATE, FERRITIN, TIBC, IRON, RETICCTPCT in the last 72 hours. Urine analysis:    Component Value Date/Time   COLORURINE YELLOW 06/26/2016 1601   APPEARANCEUR CLEAR 06/26/2016 1601   LABSPEC 1.008 06/26/2016 1601   PHURINE 7.0 06/26/2016 1601   GLUCOSEU NEGATIVE 06/26/2016 1601   HGBUR NEGATIVE 06/26/2016 1601   HGBUR negative 01/01/2010 1353   BILIRUBINUR NEGATIVE 06/26/2016 1601   BILIRUBINUR n 05/30/2015 1542   KETONESUR NEGATIVE 06/26/2016 1601   PROTEINUR 30 (A) 06/26/2016 1601   UROBILINOGEN 0.2 05/30/2015 1542   UROBILINOGEN 1.0 11/03/2014 1442   NITRITE NEGATIVE 06/26/2016 1601   LEUKOCYTESUR NEGATIVE 06/26/2016 1601   Sepsis Labs: @LABRCNTIP (procalcitonin:4,lacticidven:4)  )No results found for this or any previous visit (from the past 240 hour(s)).    Radiology Studies: Dg Chest 2 View  Result Date: 06/26/2016 CLINICAL DATA:  Shortness of breath, low oxygen saturation, COPD and dementia EXAM: CHEST  2 VIEW COMPARISON:  Chest x-ray of 05/30/2016 FINDINGS: Moderate cardiomegaly is present with AICD and  pacer leads noted. However there are changes of congestive heart failure with small bilateral effusions and pulmonary vascular congestion. No parenchymal infiltrate is seen. Calcified granuloma remains in the left lower lobe superior segment. The bones are osteopenic. IMPRESSION: 1. Mild to minor CHF with small effusions. 2. Stable cardiomegaly with pacer and AICD leads. Electronically Signed   By: Ivar Drape M.D.   On: 06/26/2016 14:25   Dg Sacrum/coccyx  Result Date: 06/26/2016 CLINICAL DATA:  Recent falls, pain over the sacrum and hips EXAM: SACRUM AND COCCYX - 2+ VIEW COMPARISON:  Lumbar spine films of 02/11/2016 FINDINGS: The sacrococcygeal elements are in normal alignment. No fracture is seen. Hardware for posterior fusion from L4-S1 is noted. The more inferior of the sacral screws is fractured, but that is not an acute process, being present previously. The pelvic rami are intact. The SI joints are corticated. The sacral foramina are unremarkable. IMPRESSION: 1. No sacrococcygeal fracture is seen. 2. Stable posterior fusion from L4-S1. No change in fractured lower sacral screw. Electronically Signed   By: Ivar Drape M.D.   On: 06/26/2016 14:27   Ct Head Wo Contrast  Result Date: 06/26/2016 CLINICAL DATA:  Multiple falls. EXAM: CT HEAD WITHOUT CONTRAST TECHNIQUE: Contiguous axial images were obtained from the base of the skull through the vertex without intravenous contrast. COMPARISON:  06/03/2016 FINDINGS: Brain: There is no evidence for acute hemorrhage, hydrocephalus, mass lesion, or abnormal extra-axial fluid collection. No definite CT evidence for acute infarction. Diffuse loss of parenchymal volume is consistent with atrophy. Patchy low attenuation in the deep hemispheric and periventricular white matter is nonspecific, but likely reflects chronic microvascular ischemic demyelination. Vascular: Atherosclerotic calcification is visualized in the carotid arteries. No dense MCA sign. Major dural  sinuses are unremarkable. Skull: No evidence for fracture. No worrisome lytic or sclerotic lesion. Sinuses/Orbits: The visualized paranasal sinuses and mastoid air cells are clear. Visualized portions of the globes and intraorbital fat are unremarkable. Other: None. IMPRESSION: 1. Stable.  No acute intracranial abnormality. 2. Atrophy with chronic small vessel white matter ischemic disease. Electronically Signed   By: Misty Stanley M.D.   On: 06/26/2016 14:30   Dg Hips Bilat With Pelvis 2v  Result Date: 06/26/2016 CLINICAL DATA:  Recent falls, sacral and hip pain EXAM: DG HIP (WITH OR WITHOUT PELVIS) 2V BILAT COMPARISON:  Lumbar spine films of 02/11/2016 FINDINGS: There is moderate degenerative joint disease involving both hips with some loss of joint space and sclerosis with spurring. No acute fracture is seen. The pelvic rami are intact. The SI joints appear corticated. IMPRESSION: Moderate degenerative joint disease of the hips. No acute abnormality. Electronically Signed   By: Ivar Drape M.D.   On: 06/26/2016 14:29     Scheduled Meds: . allopurinol  300 mg Oral Daily  . aspirin EC  81 mg Oral Daily  . atorvastatin  40 mg Oral q1800  . bisoprolol  5 mg Oral Daily  . clopidogrel  75 mg Oral Daily  . DULoxetine  60 mg Oral BID  . enoxaparin (LOVENOX) injection  40 mg Subcutaneous Q24H  . furosemide  40 mg Intravenous BID  . insulin aspart  0-9 Units Subcutaneous TID WC  . ipratropium-albuterol  3 mL Nebulization TID  . lisinopril  5 mg Oral Daily  . potassium chloride  20 mEq Oral BID  . sodium chloride flush  3 mL Intravenous Q12H   Continuous Infusions:   LOS: 2 days    Time spent: 30 min    Janece Canterbury, MD [facility] Pager [phone]  If 7PM-7AM, please contact night-coverage [email or web address] Password [email or web address] 06/28/2016, 1:20 PM

## 2016-06-28 NOTE — Evaluation (Signed)
Occupational Therapy Evaluation Patient Details Name: Sydney Phillips MRN: [id number] DOB: 08/16/1935 Today's Date: 06/28/2016    History of Present Illness 81 y.o. female with chronic systolic heart failure with EF of 20-25% sp/ AICD, CAD with NSTEMI in December 2017 that resulted in DES to the RCA, peripheral vascular disease s/p fem-pop bypass on the right and left carotid endarterectomy and a stent to the left subclavian artery and admitted for Acute on chronic combined systolic and diastolic CHF    Clinical Impression   Pt was admitted for the above.  Recently, she has needed assistance with adls and has sustained recent falls.  Pt currently needs min guard to min A for adls and transfers. Goals are for supervision in acute setting    Follow Up Recommendations  SNF;Supervision/Assistance - 24 hour (and HHOT, if home)    Equipment Recommendations       Recommendations for Other Services       Precautions / Restrictions Precautions Precautions: Fall Restrictions Weight Bearing Restrictions: No      Mobility Bed Mobility         Supine to sit: Supervision        Transfers   Equipment used: Rolling walker (2 wheeled)   Sit to Stand: Min guard         General transfer comment: for safety    Balance Overall balance assessment: History of Falls                                          ADL Overall ADL's : Needs assistance/impaired     Grooming: Set up;Sitting;Wash/dry hands;Wash/dry face;Brushing hair   Upper Body Bathing: Set up;Sitting   Lower Body Bathing: Min guard;Sit to/from stand   Upper Body Dressing : Set up;Sitting   Lower Body Dressing: Min guard;Sit to/from stand   Toilet Transfer: Minimal assistance;Stand-pivot (to recliner)             General ADL Comments: performed adl.  Pt stabilized herself against bed when performing adl.  cues for safety.  Pt has had several falls      Vision     Perception      Praxis      Pertinent Vitals/Pain Pain Assessment: No/denies pain     Hand Dominance     Extremity/Trunk Assessment Upper Extremity Assessment Upper Extremity Assessment: Overall WFL for tasks assessed           Communication Communication Communication: HOH   Cognition Arousal/Alertness: Awake/alert Behavior During Therapy: WFL for tasks assessed/performed Overall Cognitive Status: Within Functional Limits for tasks assessed (reinforcement for safety)                     General Comments       Exercises       Shoulder Instructions      Home Living Family/patient expects to be discharged to:: Private residence Living Arrangements: Children Available Help at Discharge: Available PRN/intermittently Type of Home: House             Bathroom Shower/Tub: Tub/shower unit;Curtain   Bathroom Toilet: Standard     Home Equipment: Environmental consultant - 2 wheels;Walker - 4 wheels;Cane - single point;Bedside commode;Tub bench;Grab bars - tub/shower   Additional Comments: son has been home with her; he has missed a lot of work per pt      Prior Functioning/Environment Level of  Independence: Independent with assistive device(s)        Comments: Uses rollator for ambulation, mainly household ambulator, cooks, cleans.         OT Problem List: Decreased strength;Decreased activity tolerance;Impaired balance (sitting and/or standing);Decreased safety awareness   OT Treatment/Interventions: Self-care/ADL training;DME and/or AE instruction;Patient/family education;Balance training    OT Goals(Current goals can be found in the care plan section) Acute Rehab OT Goals Patient Stated Goal: to be able to go home OT Goal Formulation: With patient Time For Goal Achievement: 07/05/16 Potential to Achieve Goals: Good ADL Goals Pt Will Perform Grooming: with supervision;standing Pt Will Perform Lower Body Dressing: with supervision;sit to/from stand Pt Will Transfer to Toilet:  with supervision;bedside commode;ambulating Pt Will Perform Toileting - Clothing Manipulation and hygiene: with supervision;sit to/from stand  OT Frequency: Min 2X/week   Barriers to D/C:            Co-evaluation              End of Session    Activity Tolerance: Patient tolerated treatment well Patient left: in chair;with call bell/phone within reach;with chair alarm set   Time: [id number] OT Time Calculation (min): 25 min Charges:  OT General Charges $OT Visit: 1 Procedure OT Evaluation $OT Eval Low Complexity: 1 Procedure OT Treatments $Self Care/Home Management : 8-22 mins G-Codes:    Romulus Hanrahan 2016-07-19, 2:53 PM  Lesle Chris, OTR/L [phone] 07/19/2016

## 2016-06-29 LAB — GLUCOSE, CAPILLARY
GLUCOSE-CAPILLARY: 97 mg/dL (ref 65–99)
Glucose-Capillary: 98 mg/dL (ref 65–99)
Glucose-Capillary: 154 mg/dL — ABNORMAL HIGH (ref 65–99)
Glucose-Capillary: 136 mg/dL — ABNORMAL HIGH (ref 65–99)
Glucose-Capillary: 146 mg/dL — ABNORMAL HIGH (ref 65–99)

## 2016-06-29 LAB — BASIC METABOLIC PANEL
ANION GAP: 9 (ref 5–15)
BUN: 26 mg/dL — AB (ref 6–20)
CHLORIDE: 103 mmol/L (ref 101–111)
CO2: 28 mmol/L (ref 22–32)
Calcium: 8.7 mg/dL — ABNORMAL LOW (ref 8.9–10.3)
Creatinine, Ser: 1.12 mg/dL — ABNORMAL HIGH (ref 0.44–1.00)
GFR, EST AFRICAN AMERICAN: 53 mL/min — AB (ref >60)
GFR, EST NON AFRICAN AMERICAN: 46 mL/min — AB (ref >60)
Glucose, Bld: 94 mg/dL (ref 65–99)
POTASSIUM: 4.3 mmol/L (ref 3.5–5.1)
SODIUM: 140 mmol/L (ref 135–145)

## 2016-06-29 LAB — MAGNESIUM: MAGNESIUM: 1.8 mg/dL (ref 1.7–2.4)

## 2016-06-29 MED ORDER — HYDROCODONE-ACETAMINOPHEN 5-325 MG PO TABS
1.0000 | ORAL_TABLET | Freq: Four times a day (QID) | ORAL | Status: DC | PRN
  Administered 2016-06-29 – 2016-06-30 (×2): 2 via ORAL
  Filled 2016-06-29 (×2): qty 2

## 2016-06-29 MED ORDER — MAGNESIUM OXIDE 400 (241.3 MG) MG PO TABS
200.0000 mg | ORAL_TABLET | Freq: Two times a day (BID) | ORAL | Status: DC
  Administered 2016-06-29 – 2016-06-30 (×3): 200 mg via ORAL
  Filled 2016-06-29 (×3): qty 1

## 2016-06-29 MED ORDER — TRIAMCINOLONE 0.1 % CREAM:EUCERIN CREAM 1:1
TOPICAL_CREAM | Freq: Two times a day (BID) | CUTANEOUS | Status: DC | PRN
  Filled 2016-06-29: qty 1

## 2016-06-29 MED ORDER — FUROSEMIDE 40 MG PO TABS
40.0000 mg | ORAL_TABLET | Freq: Two times a day (BID) | ORAL | Status: DC
  Administered 2016-06-29 – 2016-06-30 (×2): 40 mg via ORAL
  Filled 2016-06-29 (×2): qty 1

## 2016-06-29 NOTE — Progress Notes (Signed)
Subjective:    SOB improving, near basline.   Objective:   Temp:  [97.8 F (36.6 C)-98.2 F (36.8 C)] 97.8 F (36.6 C) (01/14 0612) Pulse Rate:  [70-79] 76 (01/14 0612) Resp:  [18] 18 (01/13 1430) BP: (113-135)/(54-62) 115/61 (01/14 0612) SpO2:  [94 %-98 %] 98 % (01/14 0612) FiO2 (%):  [28 %] 28 % (01/13 0935) Weight:  [169 lb 8.5 oz (76.9 kg)] 169 lb 8.5 oz (76.9 kg) (01/14 0612) Last BM Date: 06/26/16  Filed Weights   06/27/16 0300 06/28/16 0500 06/29/16 0612  Weight: 171 lb 11.8 oz (77.9 kg) 172 lb 2.9 oz (78.1 kg) 169 lb 8.5 oz (76.9 kg)    Intake/Output Summary (Last 24 hours) at 06/29/16 0828 Last data filed at 06/29/16 0500  Gross per 24 hour  Intake              720 ml  Output             2350 ml  Net            -1630 ml    Telemetry: V-paced  Exam:  General: NAD  HEENT: sclera clear, throat clear  Resp: CTAB  Cardiac: RRR, no m/r/g, no jvd  [id number] soft, NT, ND  MSK: no LE edema  Neuro: no focal deficits  Psych: appropriate affect  Lab Results:  Basic Metabolic Panel:  Recent Labs Lab 06/27/16 0101 06/28/16 0506 06/29/16 0504  NA 140 141 140  K 3.7 3.7 4.3  CL 104 103 103  CO2 23 30 28   GLUCOSE 191* 100* 94  BUN 14 26* 26*  CREATININE 1.06* 1.01* 1.12*  CALCIUM 9.1 8.5* 8.7*  MG  --   --  1.8    Liver Function Tests:  Recent Labs Lab 06/26/16 1347  AST 17  ALT 11*  ALKPHOS 140*  BILITOT 1.5*  PROT 6.9  ALBUMIN 4.0    CBC:  Recent Labs Lab 06/26/16 1347  WBC 8.7  HGB 14.4  HCT 45.0  MCV 90.4  PLT 431*    Cardiac Enzymes:  Recent Labs Lab 06/26/16 2017 06/27/16 0101 06/27/16 0639  TROPONINI 0.09* 0.07* 0.07*    BNP: No results for input(s): PROBNP in the last 8760 hours.  Coagulation: No results for input(s): INR in the last 168 hours.  ECG:   Medications:   Scheduled Medications: . allopurinol  300 mg Oral Daily  . aspirin EC  81 mg Oral Daily  . atorvastatin  40 mg Oral q1800  .  bisoprolol  5 mg Oral Daily  . clopidogrel  75 mg Oral Daily  . DULoxetine  60 mg Oral BID  . enoxaparin (LOVENOX) injection  40 mg Subcutaneous Q24H  . furosemide  40 mg Intravenous BID  . insulin aspart  0-9 Units Subcutaneous TID WC  . ipratropium-albuterol  3 mL Nebulization TID  . lisinopril  5 mg Oral Daily  . magnesium oxide  200 mg Oral BID  . potassium chloride  20 mEq Oral BID  . sodium chloride flush  3 mL Intravenous Q12H     Infusions:   PRN Medications:  sodium chloride, acetaminophen, albuterol, ALPRAZolam, fluticasone, nitroGLYCERIN, ondansetron (ZOFRAN) IV, sodium chloride flush     Assessment/Plan   1. Acute on chronic combined CHF with MR - echo 06/2016 shows LVEF 20-25%, abnormal diastolic function, moderate MR - cath 05/2016 RCA and LCX disease, both received a DES. Moderate pulm HTN - currently on lasix IV 40mg  bid.  Negative 1.6 liters yesterday, negative 4.6 liters since admission. Mild uptrend in Cr fairly stable from yesterday. Her baseline Cr very labile over the last several months upon review, current level is reasonable in setting of diuresis and fluid overload - medical therapy with bisoprolol 5, lisinopril 5mg  daily.   - symptoms improved, she appears near euvolemia by exam. Her volume status and renal function has been very labile over the last several weeks on various diuretic regimens, the exact trail is somewhat difficult to follow. Will start lasix 40mg  po bid this evening(she received IV this AM) and follow I/Os and renal function closely.   2. CAD -cath 05/2016 RCA and LCX disease, both received a DES.  - continue asa, plavix, atorva, bisop,lisionpril - flat nonspecific troponin this admission without chest pain in setting of CHF, not consistent with ACS.          Carlyle Dolly, M.D., F.A.C.C.Patient ID: Sydney Phillips, female   DOB: 17-Dec-1935, 81 y.o.   MRN: [id number]

## 2016-06-29 NOTE — Progress Notes (Signed)
PROGRESS NOTE  [name]  [id number] DOB: Jan 24, 1936 DOA: 06/26/2016 PCP: Alysia Penna, MD  Brief Narrative:   Sydney Phillips is a 81 y.o. female with chronic systolic heart failure with EF of 20-25% sp/ AICD, CAD with NSTEMI in December 2017 that resulted in DES to the RCA, peripheral vascular disease s/p fem-pop bypass on the right and left carotid endarterectomy and a stent to the left subclavian artery.  She has COPD, diet controlled diabetes mellitus, GERD, dementia, and depression.  She presented with increasing weakness with recurrent falls, orthopnea, PND, and urinary incontinence.  In the ER, she was hypoxic and tachypneic but improved on 2-3 L .  Her BNP was elevated from baseline and CXR demonstrated pulmonary edema with bilateral pleural effusions consistent with acute on chronic sHF.  Troponin was 0.12 and trended down to 0.07, minimally elevated and patient without chest pain.  She was started on lasix and her breathing has improved.   Cardiology assisting with management.  Assessment & Plan:   Principal Problem:   Acute on chronic combined systolic and diastolic CHF (congestive heart failure) (HCC) Active Problems:   Essential hypertension   Biventricular implantable cardioverter-defibrillator in situ   Cardiomyopathy ([place])   CHF exacerbation (HCC)   CAD in native artery   Ischemic mitral valve regurgitation   Pulmonary hypertension   Controlled diabetes mellitus type 2 with complications (HCC)   CKD (chronic kidney disease), stage III   Elevated lactic acid level   Chronic respiratory failure with hypoxia (HCC)   Elevated troponin   Acute on chronic systolic heart failure (HCC)  Acute respiratory failure with hypoxia secondary to acute on chronic systolic heart failure, continues to improve.  On room air again this morning and lying flat.  At risk for AKI.   -  Telemetry:  Paced rhythm  -  Lasix 40mg  IV this morning and will transition to PO this  evening -  wt decreased slightly -  Neg 1.6L  -  d/c foley  -  continue daily potassium -  BUN and creatinine starting to rise slightly but also started ACEI -  Continue lisinopril  -  Repeat BMP in AM but if stable, ready for discharge  Elevated troponin, likely due to demand ischemia secondary to heart failure exacerbation -  Troponins 0.12 >> 0.07 -  Continue plavix and aspirin 81mg  daily -  Continue beta blocker and statin -  Cardiology consult appreciated -  ECHO:  EF appears stable, mild to mod MR  Lactic acidosis likely due to CHF  Gout, stable, continue allopurinol  COPD.  CXR consistent with heart failrue -  Continue duonebs   Anxiety and depression, stable -  Continue cymbalta and xanax  Chronic pain, stable, continue norco prn  Diabetes mellitus type 2, A1c 6.9, CBG stable -  continue low dose SSI  Generalized weakness and falls -  PT recommending SNF -  SW consult placed  DVT prophylaxis: lovenox  Code Status:  DNR Family Communication: patient alone  Disposition Plan: likely to SNF vs. Home tomorrow  Consultants:   Cardiology  Procedures:  ECHO: Left ventricle: The cavity size was severely dilated. Wall   thickness was normal. Systolic function was severely reduced. The   estimated ejection fraction was in the range of 20% to 25%.   Diffuse hypokinesis. Doppler parameters are consistent with both   elevated ventricular end-diastolic filling pressure and elevated   left atrial filling pressure. - Aortic valve: Valve area (VTI):  2.05 cm^2. Valve area (Vmax):   1.93 cm^2. Valve area (Vmean): 1.99 cm^2. - Mitral valve: There was moderate regurgitation. Valve area by   continuity equation (using LVOT flow): 2.19 cm^2. - Left atrium: The atrium was moderately dilated. - Atrial septum: No defect or patent foramen ovale was identified. - Pulmonary arteries: PA peak pressure: 53 mm Hg (S).   Antimicrobials:  Anti-infectives    None        Subjective:  Feeling better today.  She is off oxygen this morning and able to lie flat in bed.  Feels that her breathing is back to baseline.  Her bottom is hurting less from where she had her fall at home.  Itching on her back.    Objective: Vitals:   06/28/16 2026 06/28/16 2100 06/29/16 0612 06/29/16 0920  BP:  132/60 115/61   Pulse:  79 76   Resp:      Temp:  98.2 F (36.8 C) 97.8 F (36.6 C)   TempSrc:  Oral Oral   SpO2: 97% 98% 98% 97%  Weight:   76.9 kg (169 lb 8.5 oz)   Height:        Intake/Output Summary (Last 24 hours) at 06/29/16 1116 Last data filed at 06/29/16 0500  Gross per 24 hour  Intake              480 ml  Output             1350 ml  Net             -870 ml   Filed Weights   06/27/16 0300 06/28/16 0500 06/29/16 0612  Weight: 77.9 kg (171 lb 11.8 oz) 78.1 kg (172 lb 2.9 oz) 76.9 kg (169 lb 8.5 oz)    Examination:  General exam:  Adult female.  No acute distress.  Lying flat in bed without oxygen HEENT:  NCAT, MMM Respiratory system:  Rales that clear with repeat respirations at the bilateral bases, no rhonchi and no wheeze Cardiovascular system: Regular rate and rhythm, normal S1/S2.  Warm extremities Gastrointestinal system: Normal active bowel sounds, soft, nondistended, nontender. MSK:  Normal tone and bulk, no lower extremity edema Neuro:  Grossly intact    Data Reviewed: I have personally reviewed following labs and imaging studies  CBC:  Recent Labs Lab 06/26/16 1347  WBC 8.7  NEUTROABS 5.9  HGB 14.4  HCT 45.0  MCV 90.4  PLT 99991111*   Basic Metabolic Panel:  Recent Labs Lab 06/26/16 1347 06/27/16 0101 06/28/16 0506 06/29/16 0504  NA 142 140 141 140  K 3.4* 3.7 3.7 4.3  CL 105 104 103 103  CO2 26 23 30 28   GLUCOSE 134* 191* 100* 94  BUN 6 14 26* 26*  CREATININE 0.86 1.06* 1.01* 1.12*  CALCIUM 9.2 9.1 8.5* 8.7*  MG  --   --   --  1.8   GFR: Estimated Creatinine Clearance: 42.5 mL/min (by C-G formula based on SCr  of 1.12 mg/dL (H)). Liver Function Tests:  Recent Labs Lab 06/26/16 1347  AST 17  ALT 11*  ALKPHOS 140*  BILITOT 1.5*  PROT 6.9  ALBUMIN 4.0   No results for input(s): LIPASE, AMYLASE in the last 168 hours. No results for input(s): AMMONIA in the last 168 hours. Coagulation Profile: No results for input(s): INR, PROTIME in the last 168 hours. Cardiac Enzymes:  Recent Labs Lab 06/26/16 1347 06/26/16 2017 06/27/16 0101 06/27/16 0639  TROPONINI 0.12* 0.09* 0.07* 0.07*  BNP (last 3 results) No results for input(s): PROBNP in the last 8760 hours. HbA1C: No results for input(s): HGBA1C in the last 72 hours. CBG:  Recent Labs Lab 06/28/16 1153 06/28/16 1710 06/28/16 2157 06/29/16 0522 06/29/16 0745  GLUCAP 170* 104* 140* 97 98   Lipid Profile: No results for input(s): CHOL, HDL, LDLCALC, TRIG, CHOLHDL, LDLDIRECT in the last 72 hours. Thyroid Function Tests: No results for input(s): TSH, T4TOTAL, FREET4, T3FREE, THYROIDAB in the last 72 hours. Anemia Panel: No results for input(s): VITAMINB12, FOLATE, FERRITIN, TIBC, IRON, RETICCTPCT in the last 72 hours. Urine analysis:    Component Value Date/Time   COLORURINE YELLOW 06/26/2016 1601   APPEARANCEUR CLEAR 06/26/2016 1601   LABSPEC 1.008 06/26/2016 1601   PHURINE 7.0 06/26/2016 1601   GLUCOSEU NEGATIVE 06/26/2016 1601   HGBUR NEGATIVE 06/26/2016 1601   HGBUR negative 01/01/2010 1353   BILIRUBINUR NEGATIVE 06/26/2016 1601   BILIRUBINUR n 05/30/2015 1542   KETONESUR NEGATIVE 06/26/2016 1601   PROTEINUR 30 (A) 06/26/2016 1601   UROBILINOGEN 0.2 05/30/2015 1542   UROBILINOGEN 1.0 11/03/2014 1442   NITRITE NEGATIVE 06/26/2016 1601   LEUKOCYTESUR NEGATIVE 06/26/2016 1601   Sepsis Labs: @LABRCNTIP (procalcitonin:4,lacticidven:4)  )No results found for this or any previous visit (from the past 240 hour(s)).    Radiology Studies: No results found.   Scheduled Meds: . allopurinol  300 mg Oral Daily  .  aspirin EC  81 mg Oral Daily  . atorvastatin  40 mg Oral q1800  . bisoprolol  5 mg Oral Daily  . clopidogrel  75 mg Oral Daily  . DULoxetine  60 mg Oral BID  . enoxaparin (LOVENOX) injection  40 mg Subcutaneous Q24H  . furosemide  40 mg Oral BID  . insulin aspart  0-9 Units Subcutaneous TID WC  . ipratropium-albuterol  3 mL Nebulization TID  . lisinopril  5 mg Oral Daily  . magnesium oxide  200 mg Oral BID  . potassium chloride  20 mEq Oral BID  . sodium chloride flush  3 mL Intravenous Q12H   Continuous Infusions:   LOS: 3 days    Time spent: 30 min    Janece Canterbury, MD [facility] Pager [phone]  If 7PM-7AM, please contact night-coverage [email or web address] Password [email or web address] 06/29/2016, 11:16 AM

## 2016-06-30 LAB — BASIC METABOLIC PANEL
Anion gap: 8 (ref 5–15)
BUN: 27 mg/dL — ABNORMAL HIGH (ref 6–20)
CALCIUM: 9 mg/dL (ref 8.9–10.3)
CO2: 29 mmol/L (ref 22–32)
Chloride: 102 mmol/L (ref 101–111)
Creatinine, Ser: 1.15 mg/dL — ABNORMAL HIGH (ref 0.44–1.00)
GFR, EST AFRICAN AMERICAN: 51 mL/min — AB (ref >60)
GFR, EST NON AFRICAN AMERICAN: 44 mL/min — AB (ref >60)
Glucose, Bld: 99 mg/dL (ref 65–99)
Potassium: 4.6 mmol/L (ref 3.5–5.1)
Sodium: 139 mmol/L (ref 135–145)

## 2016-06-30 LAB — GLUCOSE, CAPILLARY
GLUCOSE-CAPILLARY: 101 mg/dL — AB (ref 65–99)
GLUCOSE-CAPILLARY: 96 mg/dL (ref 65–99)
Glucose-Capillary: 138 mg/dL — ABNORMAL HIGH (ref 65–99)

## 2016-06-30 MED ORDER — FUROSEMIDE 40 MG PO TABS: 40.0000 mg | ORAL_TABLET | Freq: Two times a day (BID) | ORAL | 0 refills | Status: DC

## 2016-06-30 MED ORDER — LISINOPRIL 5 MG PO TABS: 5.0000 mg | ORAL_TABLET | Freq: Every day | ORAL | 0 refills | Status: DC

## 2016-06-30 MED ORDER — POTASSIUM CHLORIDE CRYS ER 20 MEQ PO TBCR: 20.0000 meq | EXTENDED_RELEASE_TABLET | Freq: Every day | ORAL | Status: DC

## 2016-06-30 NOTE — Progress Notes (Signed)
No ICM remote transmission received on 06/24/2016.   Next ICM transmission scheduled for 07/07/2016.

## 2016-06-30 NOTE — Progress Notes (Signed)
CSW reviewed PT evaluation recommending supervision/assistance - 24 hour; SNF. CSW spoke with patient re: discharge planning, patient states that she had just spent 30 days at [facility] but prefers to return home. Patient states that she lives with her one son, [name] that works but that she has 2 other sons and a daughter-in-law who will be helping her out at home as well. RNCM, Cookie made aware.   No further CSW needs identified - CSW signing off.   Raynaldo Opitz, [facility] Clinical Social Worker cell #: [phone]

## 2016-06-30 NOTE — Progress Notes (Signed)
Patient Name: Sydney Phillips Date of Encounter: 06/30/2016  Primary Cardiologist: Dr. Partridge House Problem List     Principal Problem:   Acute on chronic combined systolic and diastolic CHF (congestive heart failure) ([place]) Active Problems:   Essential hypertension   Biventricular implantable cardioverter-defibrillator in situ   Cardiomyopathy ([place])   CHF exacerbation ([place])   CAD in native artery   Ischemic mitral valve regurgitation   Pulmonary hypertension   Controlled diabetes mellitus type 2 with complications (HCC)   CKD (chronic kidney disease), stage III   Elevated lactic acid level   Chronic respiratory failure with hypoxia (HCC)   Elevated troponin   Acute on chronic systolic heart failure (HCC)    Subjective   Breathing is back to baseline. Laying totally flat without dyspnea. No edema. No chest pain.  Inpatient Medications    . allopurinol  300 mg Oral Daily  . aspirin EC  81 mg Oral Daily  . atorvastatin  40 mg Oral q1800  . bisoprolol  5 mg Oral Daily  . clopidogrel  75 mg Oral Daily  . DULoxetine  60 mg Oral BID  . enoxaparin (LOVENOX) injection  40 mg Subcutaneous Q24H  . furosemide  40 mg Oral BID  . insulin aspart  0-9 Units Subcutaneous TID WC  . ipratropium-albuterol  3 mL Nebulization TID  . lisinopril  5 mg Oral Daily  . magnesium oxide  200 mg Oral BID  . potassium chloride  20 mEq Oral BID  . sodium chloride flush  3 mL Intravenous Q12H    Vital Signs    Vitals:   06/29/16 1953 06/29/16 2100 06/30/16 0428 06/30/16 0911  BP:  128/71 (!) 124/54   Pulse:  88 76 75  Resp:  17 18 18   Temp:  97.8 F (36.6 C) 98 F (36.7 C)   TempSrc:  Oral Oral   SpO2: 94% 94% 99% 98%  Weight:   170 lb 10.2 oz (77.4 kg)   Height:        Intake/Output Summary (Last 24 hours) at 06/30/16 1007 Last data filed at 06/30/16 0917  Gross per 24 hour  Intake              460 ml  Output             1700 ml  Net            -1240 ml   Filed Weights    06/28/16 0500 06/29/16 0612 06/30/16 0428  Weight: 172 lb 2.9 oz (78.1 kg) 169 lb 8.5 oz (76.9 kg) 170 lb 10.2 oz (77.4 kg)    Physical Exam    General: Well developed, well nourished WF in no acute distress. HEENT: Normocephalic, atraumatic, sclera non-icteric, no xanthomas, nares are without discharge. Neck: Negative for carotid bruits. JVP not elevated. Lungs: Coarse BS bilaterally to auscultation without wheezes, rales, or rhonchi. Breathing is unlabored. Cardiac: RRR S1 S2 without murmurs, rubs, or gallops.  Abdomen: Soft, non-tender, non-distended with normoactive bowel sounds. No rebound/guarding. Extremities: No clubbing or cyanosis. No edema. Distal pedal pulses are 2+ and equal bilaterally. Skin: Warm and dry, no significant rash. Neuro: Alert and oriented X 3. Sensation in tact. Follows commands. Psych:  Responds to questions appropriately with a normal affect.  Labs    CBC No results for input(s): WBC, NEUTROABS, HGB, HCT, MCV, PLT in the last 72 hours. Basic Metabolic Panel  Recent Labs  06/29/16 0504 06/30/16 0531  NA 140  139  K 4.3 4.6  CL 103 102  CO2 28 29  GLUCOSE 94 99  BUN 26* 27*  CREATININE 1.12* 1.15*  CALCIUM 8.7* 9.0  MG 1.8  --     Telemetry    V paced  Radiology    Dg Chest 2 View  Result Date: 06/26/2016 CLINICAL DATA:  Shortness of breath, low oxygen saturation, COPD and dementia EXAM: CHEST  2 VIEW COMPARISON:  Chest x-ray of 05/30/2016 FINDINGS: Moderate cardiomegaly is present with AICD and pacer leads noted. However there are changes of congestive heart failure with small bilateral effusions and pulmonary vascular congestion. No parenchymal infiltrate is seen. Calcified granuloma remains in the left lower lobe superior segment. The bones are osteopenic. IMPRESSION: 1. Mild to minor CHF with small effusions. 2. Stable cardiomegaly with pacer and AICD leads. Electronically Signed   By: Ivar Drape M.D.   On: 06/26/2016 14:25   Dg  Sacrum/coccyx  Result Date: 06/26/2016 CLINICAL DATA:  Recent falls, pain over the sacrum and hips EXAM: SACRUM AND COCCYX - 2+ VIEW COMPARISON:  Lumbar spine films of 02/11/2016 FINDINGS: The sacrococcygeal elements are in normal alignment. No fracture is seen. Hardware for posterior fusion from L4-S1 is noted. The more inferior of the sacral screws is fractured, but that is not an acute process, being present previously. The pelvic rami are intact. The SI joints are corticated. The sacral foramina are unremarkable. IMPRESSION: 1. No sacrococcygeal fracture is seen. 2. Stable posterior fusion from L4-S1. No change in fractured lower sacral screw. Electronically Signed   By: Ivar Drape M.D.   On: 06/26/2016 14:27   Ct Head Wo Contrast  Result Date: 06/26/2016 CLINICAL DATA:  Multiple falls. EXAM: CT HEAD WITHOUT CONTRAST TECHNIQUE: Contiguous axial images were obtained from the base of the skull through the vertex without intravenous contrast. COMPARISON:  06/03/2016 FINDINGS: Brain: There is no evidence for acute hemorrhage, hydrocephalus, mass lesion, or abnormal extra-axial fluid collection. No definite CT evidence for acute infarction. Diffuse loss of parenchymal volume is consistent with atrophy. Patchy low attenuation in the deep hemispheric and periventricular white matter is nonspecific, but likely reflects chronic microvascular ischemic demyelination. Vascular: Atherosclerotic calcification is visualized in the carotid arteries. No dense MCA sign. Major dural sinuses are unremarkable. Skull: No evidence for fracture. No worrisome lytic or sclerotic lesion. Sinuses/Orbits: The visualized paranasal sinuses and mastoid air cells are clear. Visualized portions of the globes and intraorbital fat are unremarkable. Other: None. IMPRESSION: 1. Stable.  No acute intracranial abnormality. 2. Atrophy with chronic small vessel white matter ischemic disease. Electronically Signed   By: Misty Stanley M.D.   On:  06/26/2016 14:30   Ct Head Wo Contrast  Result Date: 06/03/2016 CLINICAL DATA:  Pain following fall EXAM: CT HEAD WITHOUT CONTRAST CT CERVICAL SPINE WITHOUT CONTRAST TECHNIQUE: Multidetector CT imaging of the head and cervical spine was performed following the standard protocol without intravenous contrast. Multiplanar CT image reconstructions of the cervical spine were also generated. COMPARISON:  Head CT May 30, 2016; cervical spine CT May 14, 2016 FINDINGS: CT HEAD FINDINGS Brain: Mild diffuse atrophy is stable. There is no intracranial mass, hemorrhage, extra-axial fluid collection, or midline shift. There is stable patchy small vessel disease throughout the centra semiovale bilaterally. There is no new gray-white compartment lesion. No acute infarct is evident. Vascular: There is no hyperdense vessel. There is calcification in each carotid siphon region. Skull: The bony calvarium appears intact. There is hyperostosis frontalis bilaterally. There is  a high left parietal scalp hematoma. Sinuses/Orbits: Visualized paranasal sinuses are clear. Orbits appear symmetric bilaterally. Other: Mastoid air cells are clear. CT CERVICAL SPINE FINDINGS Alignment: There is minimal anterolisthesis of C4 on C5, stable. No new spondylolisthesis. There is cervicothoracic levoscoliosis. Skull base and vertebrae: The skull base and 1 craniovertebral junction regions appear unremarkable 2. There is no demonstrable fracture. No blastic or lytic bone lesions are evident. Soft tissues and spinal canal: Prevertebral soft tissues and predental space regions are normal. There is no paraspinous lesion or high-grade spinal stenosis. Disc levels: There is mild to moderate disc space narrowing at C5-6 and C6-7. There is more severe disc space narrowing visualized upper thoracic region. There is facet hypertrophy at multiple levels bilaterally. There is exit foraminal narrowing due to bony hypertrophy at C3-4 bilaterally, at C4-5  on the left, and at at C5-6 on the right, with narrowing and impression on the exiting nerve root most marked at C5-6 on the right and at C4-5 on the left. No well-defined disc extrusion is seen on this noncontrast enhanced study. At C5-6 on the right, there is asymmetric calcified disc protrusion. Upper chest: Visualized lung bases are clear. Other: There is calcification in each carotid artery. There is a 7 mm nodular opacity arising from the left lobe of the thyroid. IMPRESSION: CT head: Atrophy with patchy small vessel disease in the supratentorial white matter bilaterally. No intracranial mass hemorrhage, or acute appearing infarct. No midline shift or extra-axial fluid. High left parietal scalp hematoma. CT cervical spine: No fracture. Minimal anterolisthesis of C4 on C5, felt to be due to underlying spondylosis. Multilevel arthropathy as summarized above. Calcification in each carotid artery. Subcentimeter left lobe thyroid nodule. Electronically Signed   By: Lowella Grip III M.D.   On: 06/03/2016 08:56   Ct Cervical Spine Wo Contrast  Result Date: 06/03/2016 CLINICAL DATA:  Pain following fall EXAM: CT HEAD WITHOUT CONTRAST CT CERVICAL SPINE WITHOUT CONTRAST TECHNIQUE: Multidetector CT imaging of the head and cervical spine was performed following the standard protocol without intravenous contrast. Multiplanar CT image reconstructions of the cervical spine were also generated. COMPARISON:  Head CT May 30, 2016; cervical spine CT May 14, 2016 FINDINGS: CT HEAD FINDINGS Brain: Mild diffuse atrophy is stable. There is no intracranial mass, hemorrhage, extra-axial fluid collection, or midline shift. There is stable patchy small vessel disease throughout the centra semiovale bilaterally. There is no new gray-white compartment lesion. No acute infarct is evident. Vascular: There is no hyperdense vessel. There is calcification in each carotid siphon region. Skull: The bony calvarium appears  intact. There is hyperostosis frontalis bilaterally. There is a high left parietal scalp hematoma. Sinuses/Orbits: Visualized paranasal sinuses are clear. Orbits appear symmetric bilaterally. Other: Mastoid air cells are clear. CT CERVICAL SPINE FINDINGS Alignment: There is minimal anterolisthesis of C4 on C5, stable. No new spondylolisthesis. There is cervicothoracic levoscoliosis. Skull base and vertebrae: The skull base and 1 craniovertebral junction regions appear unremarkable 2. There is no demonstrable fracture. No blastic or lytic bone lesions are evident. Soft tissues and spinal canal: Prevertebral soft tissues and predental space regions are normal. There is no paraspinous lesion or high-grade spinal stenosis. Disc levels: There is mild to moderate disc space narrowing at C5-6 and C6-7. There is more severe disc space narrowing visualized upper thoracic region. There is facet hypertrophy at multiple levels bilaterally. There is exit foraminal narrowing due to bony hypertrophy at C3-4 bilaterally, at C4-5 on the left, and at at C5-6  on the right, with narrowing and impression on the exiting nerve root most marked at C5-6 on the right and at C4-5 on the left. No well-defined disc extrusion is seen on this noncontrast enhanced study. At C5-6 on the right, there is asymmetric calcified disc protrusion. Upper chest: Visualized lung bases are clear. Other: There is calcification in each carotid artery. There is a 7 mm nodular opacity arising from the left lobe of the thyroid. IMPRESSION: CT head: Atrophy with patchy small vessel disease in the supratentorial white matter bilaterally. No intracranial mass hemorrhage, or acute appearing infarct. No midline shift or extra-axial fluid. High left parietal scalp hematoma. CT cervical spine: No fracture. Minimal anterolisthesis of C4 on C5, felt to be due to underlying spondylosis. Multilevel arthropathy as summarized above. Calcification in each carotid artery.  Subcentimeter left lobe thyroid nodule. Electronically Signed   By: Lowella Grip III M.D.   On: 06/03/2016 08:56   Dg Hips Bilat With Pelvis 2v  Result Date: 06/26/2016 CLINICAL DATA:  Recent falls, sacral and hip pain EXAM: DG HIP (WITH OR WITHOUT PELVIS) 2V BILAT COMPARISON:  Lumbar spine films of 02/11/2016 FINDINGS: There is moderate degenerative joint disease involving both hips with some loss of joint space and sclerosis with spurring. No acute fracture is seen. The pelvic rami are intact. The SI joints appear corticated. IMPRESSION: Moderate degenerative joint disease of the hips. No acute abnormality. Electronically Signed   By: Ivar Drape M.D.   On: 06/26/2016 14:29     Patient Profile     34F with CAD (NSTEMI s/p DES to RCA/Cx 05/2016), COPD with chronic respiratory failure, OSA, HTN, HLD, LBBB, h/o mixed cardiomyopathy s/p [employer] CRT-D (original ICD 2009, s/p device change out 2013), chronic combined CHF, carotid artery disease s/p L CEA, history of L subclavian artery stenosis s/p stent in February 2013, history of lower extremity PADs/p right fem-pop 2015, chronic pain, DM, CKD stage III, tobacco abuse, recent fall who presented to [facility] with worsening SOB and sacral pain.   H/o recent admissions with either diuresis or holding of diuretic due to rising Cr.  Assessment & Plan    1. Acute on chronic combined CHF with MR - continue current regimen. Prior baseline weight around 163lb. Difficult to know what her standing diuretic regimen should be as she's been all over the place lately. Prior to admission, she was off diuretics due to rising Cr but I suspect she will need to run a Cr around 1.2-1.4 to maintain euvolemia. Since weight is still up, I would favor to continue Lasix at current dose. Will review with MD. I will drop potassium to once daily given K 4.6. Patient thinks she is going home today - have arranged close TOC f/u 07/07/16.   2. CAD s/p PCI 05/2016 with minimally  elevated troponin - no recent anginal chest pain. Troponin trend flat, arguing against ACS. Would expect rising troponin rather than improving level if this represented an acute issue with stent. Would reserve relook cath vs nuc for residual sx. Note she was previously discharged on ASA/Brilinta but apparently there was some discrepancy of what she was actually taking so she is back on aspirin and Plavix this admission.   3. CKD stage III - follow. Suspect baseline Cr will have to run slightly elevated to maintain euvolemia.  4. Social situation - notes indicate some evasiveness with home health services with her and her son. Would agree with recommendation for SNF as I am not convinced  of her ability to care for herself.  5. HTN - controlled.  6. Sacral pain - per IM. Related to recent fall  Signed, Charlie Pitter, PA-C  06/30/2016, 10:07 AM  As above, patient seen and examined. She denies dyspnea or chest pain today. Will continue Lasix 40 mg twice a day at discharge. She will need her potassium and renal function checked 1 week after discharge as well as transition of care appointment at that time. Continue aspirin and Plavix at discharge. FU Dr Sallyanne Kuster following DC. [place] Parents

## 2016-06-30 NOTE — Discharge Summary (Signed)
Physician Discharge Summary  Sydney Phillips [id number] DOB: [birth year] DOA: 06/26/2016  PCP: Alysia Penna, MD  Admit date: 06/26/2016 Discharge date: 06/30/2016  Admitted From: home  Disposition:  Home  Recommendations for Outpatient Follow-up:  1. Follow up with Cardiology 1 weeks at already scheduled appointment 2. Please obtain BMP/CBC in one week:    Home Health:  RN, PT, OT, aid, social worker  Equipment/Devices:  yes  Discharge Condition:  Stable, improved CODE STATUS:  DO NOT RESUSCITATE  Diet recommendation:  Diabetic/healthy heart  Brief/Interim Summary:  Sydney Phillips a 81 y.o.femalewith chronic systolic heart failure with EF of 20-25% sp/ AICD, CAD with NSTEMI in December 2017 that resulted in DES to the RCA, peripheral vascular disease s/p fem-pop bypass on the right and left carotid endarterectomy and a stent to the left subclavian artery. She has COPD, diet controlled diabetes mellitus, GERD, dementia, and depression. She presented with increasing weakness with recurrent falls, orthopnea, PND, and urinary incontinence.  In the ER, she was hypoxic and tachypneic but improved on 2-3 L [place].  Her BNP was elevated from baseline andCXR demonstrated pulmonary edema with bilateral pleural effusions consistent with acute on chronic sHF.  Troponin was 0.12 and trended down to 0.07, minimally elevated and patient without chest pain.   she likely had some demand ischemia related to her heart failure exacerbation. She was started on IV lasix and her breathing has improved.  She is diuresed over 5-1/2 L and her weight is 77.4 kg on the date of discharge.  She is diuresed approximately 1 L per day on Lasix 40 mg by mouth twice a day.   Cardiology assisted with management.  She will need close outpatient follow-up for repeat BMP.  Discharge Diagnoses:  Principal Problem:   Acute on chronic combined systolic and diastolic CHF (congestive heart failure) (HCC) Active Problems:    Essential hypertension   Biventricular implantable cardioverter-defibrillator in situ   Cardiomyopathy ([place])   CHF exacerbation (HCC)   CAD in native artery   Ischemic mitral valve regurgitation   Pulmonary hypertension   Controlled diabetes mellitus type 2 with complications (HCC)   CKD (chronic kidney disease), stage III   Elevated lactic acid level   Chronic respiratory failure with hypoxia (HCC)   Elevated troponin   Acute on chronic systolic heart failure (HCC)  Acute respiratory failure with hypoxia secondary to acute on chronic systolic heart failure.  She was diuresed with Lasix 40 mg IV twice a day. She required a Foley catheter to monitor strict ins and outs because she is incontinent of urine at baseline. She diuresed over 5.5 L, some of which was not recorded because she did not have a Foley catheter in place. Her weight trended down to 77.4 kg on the date of discharge. She still above her dry weight of approximately 74-75 kilograms, however she is stable on room air, able to lie flat in bed, and is diuresing well on oral Lasix. She will need close outpatient follow-up to check her creatinine. I agree that her baseline creatinine should probably remain around 1.1-1.4, sacrificing a mild elevation in creatinine in order to have relief from her heart failure symptoms.   -  Continue Lasix 40 mg by mouth twice a day -  Outpatient follow-up with cardiology in 2 days - continue daily potassium -  Continue lisinopril   Elevated troponin with CAD s/p DES in 05/2016, likely due to demand ischemia secondary to heart failure exacerbation.  Patient stated she  was taken off of brilinta and changed to Plavix because of cost reasons prior to this admission. - Troponins 0.12 >> 0.07 - Continue plavix and aspirin 81mg  daily - Continue beta blocker and statin -  cardiology Recommended deferring further workup for her elevated troponin less she has persistent chest pains or other signs  concerning for ACS. ECHO:  EF appears stable, mild to mod MR  Lactic acidosislikely due to CHF.  Patient clinically improved with diuresis.    Gout, stable, continued allopurinol  COPD. CXR consistent with heart failure, doubt COPD exacerbation as she improved dramatically with diuresis.   Anxiety and depression,stable, Continued cymbalta and xanax.  Patient asked for a prescription for Xanax at the time of discharge and I declined. I advised her to talk to her primary care doctor about her sleep issues and anxiety.  Chronic pain, stable, continue norco prn  Diabetes mellitus type 2,A1c 6.9, CBG stable.  She received low-dose sliding scale insulin while in the hospital. Defer further management to her primary care doctor.   Generalized weakness and falls -  PT recommended SNF.  The patient declined skilled nursing facility placement at this time. She preferred to return home with home health services.   CKD stage 3, baseline creatinine of 1-1.4 -  Creatinine trended up from 0.86-1.15 at the time of discharge, however her creatinine has remained relatively stable for the last 3 days despite diuresis.    Discharge Instructions  Discharge Instructions    (HEART FAILURE PATIENTS) Call MD:  Anytime you have any of the following symptoms: 1) 3 pound weight gain in 24 hours or 5 pounds in 1 week 2) shortness of breath, with or without a dry hacking cough 3) swelling in the hands, feet or stomach 4) if you have to sleep on extra pillows at night in order to breathe.    Complete by:  As directed    Call MD for:  difficulty breathing, headache or visual disturbances    Complete by:  As directed    Call MD for:  extreme fatigue    Complete by:  As directed    Call MD for:  hives    Complete by:  As directed    Call MD for:  persistant dizziness or light-headedness    Complete by:  As directed    Call MD for:  persistant nausea and vomiting    Complete by:  As directed    Call MD  for:  severe uncontrolled pain    Complete by:  As directed    Call MD for:  temperature >100.4    Complete by:  As directed    Diet - low sodium heart healthy    Complete by:  As directed    Diet Carb Modified    Complete by:  As directed    Increase activity slowly    Complete by:  As directed        Medication List    STOP taking these medications   dextromethorphan-guaiFENesin 30-600 MG 12hr tablet Commonly known as:  MUCINEX DM   ticagrelor 90 MG Tabs tablet Commonly known as:  BRILINTA     TAKE these medications   albuterol 108 (90 Base) MCG/ACT inhaler Commonly known as:  PROVENTIL HFA;VENTOLIN HFA Inhale 2 puffs into the lungs every 4 (four) hours as needed for wheezing or shortness of breath.   allopurinol 300 MG tablet Commonly known as:  ZYLOPRIM Take 1 tablet (300 mg total) by mouth daily.  ALPRAZolam 0.5 MG tablet Commonly known as:  XANAX Take 2 tablets (1 mg total) by mouth at bedtime as needed for anxiety.   aspirin 81 MG chewable tablet Chew 1 tablet (81 mg total) by mouth daily.   atorvastatin 40 MG tablet Commonly known as:  LIPITOR Take 1 tablet (40 mg total) by mouth daily at 6 PM.   bisoprolol 5 MG tablet Commonly known as:  ZEBETA Take 1 tablet (5 mg total) by mouth daily.   clopidogrel 75 MG tablet Commonly known as:  PLAVIX Take 75 mg by mouth daily.   DULoxetine 60 MG capsule Commonly known as:  CYMBALTA TAKE ONE CAPSULE BY MOUTH TWICE DAILY What changed:  See the new instructions.   fluticasone 50 MCG/ACT nasal spray Commonly known as:  FLONASE Place 1 spray into both nostrils daily as needed for allergies.   furosemide 40 MG tablet Commonly known as:  LASIX Take 1 tablet (40 mg total) by mouth 2 (two) times daily.   HYDROcodone-acetaminophen 10-325 MG tablet Commonly known as:  NORCO Take one tablet by mouth every 6 hours as needed for pain. DNE 3gm of APAP from all sources/24hours Notes to patient:  Last dose on 06/30/16  @ 1236   ipratropium-albuterol 0.5-2.5 (3) MG/3ML Soln Commonly known as:  DUONEB Take 3 mLs by nebulization 3 (three) times daily.   lisinopril 5 MG tablet Commonly known as:  PRINIVIL,ZESTRIL Take 1 tablet (5 mg total) by mouth daily. Start taking on:  07/01/2016   nitroGLYCERIN 0.4 MG SL tablet Commonly known as:  NITROSTAT Place 1 tablet (0.4 mg total) under the tongue every 5 (five) minutes x 3 doses as needed for chest pain.   potassium chloride SA 20 MEQ tablet Commonly known as:  K-DUR,KLOR-CON Take 1 tablet (20 mEq total) by mouth daily.      Follow-up Information    Barrett, Suanne Marker, PA-C Follow up.   Specialties:  Cardiology, Radiology Why:  [facility] 07/07/16 at 9:30am. Suanne Marker is one of the PAs that works closely with Dr. Victorino December care team. Contact information: [address] [place] [address] [phone]        Alysia Penna, MD. Schedule an appointment as soon as possible for a visit in 2 week(s).   Specialty:  Family Medicine Contact information: [address] [place] [address] [phone]          Allergies  Allergen Reactions  . Potassium-Containing Compounds Other (See Comments)    Causes severe constipation  . Azithromycin Rash  . Codeine Itching  . Darvon Itching  . Erythromycin Rash  . Meloxicam Other (See Comments)    Unknown    . Norco [Hydrocodone-Acetaminophen] Itching  . Penicillins Rash and Other (See Comments)    Has patient had a PCN reaction causing immediate rash, facial/tongue/throat swelling, SOB or lightheadedness with hypotension: unknown Has patient had a PCN reaction causing severe rash involving mucus membranes or skin necrosis: no Has patient had a PCN reaction that required hospitalization: unknown Has patient had a PCN reaction occurring within the last 10 years: no If all of the above answers are "NO", then may proceed with Cephalosporin use.   Marland Kitchen Propoxyphene N-Acetaminophen  Itching  . Rofecoxib Other (See Comments)    Unknown    . Rosuvastatin Other (See Comments)    cramps  . Statins Itching and Other (See Comments)    Sleeplessness  . Sulfa Antibiotics Rash    Consultations: Cardiology   Procedures/Studies: Dg Chest  2 View  Result Date: 06/26/2016 CLINICAL DATA:  Shortness of breath, low oxygen saturation, COPD and dementia EXAM: CHEST  2 VIEW COMPARISON:  Chest x-ray of 05/30/2016 FINDINGS: Moderate cardiomegaly is present with AICD and pacer leads noted. However there are changes of congestive heart failure with small bilateral effusions and pulmonary vascular congestion. No parenchymal infiltrate is seen. Calcified granuloma remains in the left lower lobe superior segment. The bones are osteopenic. IMPRESSION: 1. Mild to minor CHF with small effusions. 2. Stable cardiomegaly with pacer and AICD leads. Electronically Signed   By: Ivar Drape M.D.   On: 06/26/2016 14:25   Dg Sacrum/coccyx  Result Date: 06/26/2016 CLINICAL DATA:  Recent falls, pain over the sacrum and hips EXAM: SACRUM AND COCCYX - 2+ VIEW COMPARISON:  Lumbar spine films of 02/11/2016 FINDINGS: The sacrococcygeal elements are in normal alignment. No fracture is seen. Hardware for posterior fusion from L4-S1 is noted. The more inferior of the sacral screws is fractured, but that is not an acute process, being present previously. The pelvic rami are intact. The SI joints are corticated. The sacral foramina are unremarkable. IMPRESSION: 1. No sacrococcygeal fracture is seen. 2. Stable posterior fusion from L4-S1. No change in fractured lower sacral screw. Electronically Signed   By: Ivar Drape M.D.   On: 06/26/2016 14:27   Ct Head Wo Contrast  Result Date: 06/26/2016 CLINICAL DATA:  Multiple falls. EXAM: CT HEAD WITHOUT CONTRAST TECHNIQUE: Contiguous axial images were obtained from the base of the skull through the vertex without intravenous contrast. COMPARISON:  06/03/2016 FINDINGS: Brain:  There is no evidence for acute hemorrhage, hydrocephalus, mass lesion, or abnormal extra-axial fluid collection. No definite CT evidence for acute infarction. Diffuse loss of parenchymal volume is consistent with atrophy. Patchy low attenuation in the deep hemispheric and periventricular white matter is nonspecific, but likely reflects chronic microvascular ischemic demyelination. Vascular: Atherosclerotic calcification is visualized in the carotid arteries. No dense MCA sign. Major dural sinuses are unremarkable. Skull: No evidence for fracture. No worrisome lytic or sclerotic lesion. Sinuses/Orbits: The visualized paranasal sinuses and mastoid air cells are clear. Visualized portions of the globes and intraorbital fat are unremarkable. Other: None. IMPRESSION: 1. Stable.  No acute intracranial abnormality. 2. Atrophy with chronic small vessel white matter ischemic disease. Electronically Signed   By: Misty Stanley M.D.   On: 06/26/2016 14:30   Ct Head Wo Contrast  Result Date: 06/03/2016 CLINICAL DATA:  Pain following fall EXAM: CT HEAD WITHOUT CONTRAST CT CERVICAL SPINE WITHOUT CONTRAST TECHNIQUE: Multidetector CT imaging of the head and cervical spine was performed following the standard protocol without intravenous contrast. Multiplanar CT image reconstructions of the cervical spine were also generated. COMPARISON:  Head CT May 30, 2016; cervical spine CT May 14, 2016 FINDINGS: CT HEAD FINDINGS Brain: Mild diffuse atrophy is stable. There is no intracranial mass, hemorrhage, extra-axial fluid collection, or midline shift. There is stable patchy small vessel disease throughout the centra semiovale bilaterally. There is no new gray-white compartment lesion. No acute infarct is evident. Vascular: There is no hyperdense vessel. There is calcification in each carotid siphon region. Skull: The bony calvarium appears intact. There is hyperostosis frontalis bilaterally. There is a high left parietal  scalp hematoma. Sinuses/Orbits: Visualized paranasal sinuses are clear. Orbits appear symmetric bilaterally. Other: Mastoid air cells are clear. CT CERVICAL SPINE FINDINGS Alignment: There is minimal anterolisthesis of C4 on C5, stable. No new spondylolisthesis. There is cervicothoracic levoscoliosis. Skull base and vertebrae: The skull base and  1 craniovertebral junction regions appear unremarkable 2. There is no demonstrable fracture. No blastic or lytic bone lesions are evident. Soft tissues and spinal canal: Prevertebral soft tissues and predental space regions are normal. There is no paraspinous lesion or high-grade spinal stenosis. Disc levels: There is mild to moderate disc space narrowing at C5-6 and C6-7. There is more severe disc space narrowing visualized upper thoracic region. There is facet hypertrophy at multiple levels bilaterally. There is exit foraminal narrowing due to bony hypertrophy at C3-4 bilaterally, at C4-5 on the left, and at at C5-6 on the right, with narrowing and impression on the exiting nerve root most marked at C5-6 on the right and at C4-5 on the left. No well-defined disc extrusion is seen on this noncontrast enhanced study. At C5-6 on the right, there is asymmetric calcified disc protrusion. Upper chest: Visualized lung bases are clear. Other: There is calcification in each carotid artery. There is a 7 mm nodular opacity arising from the left lobe of the thyroid. IMPRESSION: CT head: Atrophy with patchy small vessel disease in the supratentorial white matter bilaterally. No intracranial mass hemorrhage, or acute appearing infarct. No midline shift or extra-axial fluid. High left parietal scalp hematoma. CT cervical spine: No fracture. Minimal anterolisthesis of C4 on C5, felt to be due to underlying spondylosis. Multilevel arthropathy as summarized above. Calcification in each carotid artery. Subcentimeter left lobe thyroid nodule. Electronically Signed   By: Lowella Grip III  M.D.   On: 06/03/2016 08:56   Ct Cervical Spine Wo Contrast  Result Date: 06/03/2016 CLINICAL DATA:  Pain following fall EXAM: CT HEAD WITHOUT CONTRAST CT CERVICAL SPINE WITHOUT CONTRAST TECHNIQUE: Multidetector CT imaging of the head and cervical spine was performed following the standard protocol without intravenous contrast. Multiplanar CT image reconstructions of the cervical spine were also generated. COMPARISON:  Head CT May 30, 2016; cervical spine CT May 14, 2016 FINDINGS: CT HEAD FINDINGS Brain: Mild diffuse atrophy is stable. There is no intracranial mass, hemorrhage, extra-axial fluid collection, or midline shift. There is stable patchy small vessel disease throughout the centra semiovale bilaterally. There is no new gray-white compartment lesion. No acute infarct is evident. Vascular: There is no hyperdense vessel. There is calcification in each carotid siphon region. Skull: The bony calvarium appears intact. There is hyperostosis frontalis bilaterally. There is a high left parietal scalp hematoma. Sinuses/Orbits: Visualized paranasal sinuses are clear. Orbits appear symmetric bilaterally. Other: Mastoid air cells are clear. CT CERVICAL SPINE FINDINGS Alignment: There is minimal anterolisthesis of C4 on C5, stable. No new spondylolisthesis. There is cervicothoracic levoscoliosis. Skull base and vertebrae: The skull base and 1 craniovertebral junction regions appear unremarkable 2. There is no demonstrable fracture. No blastic or lytic bone lesions are evident. Soft tissues and spinal canal: Prevertebral soft tissues and predental space regions are normal. There is no paraspinous lesion or high-grade spinal stenosis. Disc levels: There is mild to moderate disc space narrowing at C5-6 and C6-7. There is more severe disc space narrowing visualized upper thoracic region. There is facet hypertrophy at multiple levels bilaterally. There is exit foraminal narrowing due to bony hypertrophy at C3-4  bilaterally, at C4-5 on the left, and at at C5-6 on the right, with narrowing and impression on the exiting nerve root most marked at C5-6 on the right and at C4-5 on the left. No well-defined disc extrusion is seen on this noncontrast enhanced study. At C5-6 on the right, there is asymmetric calcified disc protrusion. Upper chest: Visualized lung  bases are clear. Other: There is calcification in each carotid artery. There is a 7 mm nodular opacity arising from the left lobe of the thyroid. IMPRESSION: CT head: Atrophy with patchy small vessel disease in the supratentorial white matter bilaterally. No intracranial mass hemorrhage, or acute appearing infarct. No midline shift or extra-axial fluid. High left parietal scalp hematoma. CT cervical spine: No fracture. Minimal anterolisthesis of C4 on C5, felt to be due to underlying spondylosis. Multilevel arthropathy as summarized above. Calcification in each carotid artery. Subcentimeter left lobe thyroid nodule. Electronically Signed   By: Lowella Grip III M.D.   On: 06/03/2016 08:56   Dg Hips Bilat With Pelvis 2v  Result Date: 06/26/2016 CLINICAL DATA:  Recent falls, sacral and hip pain EXAM: DG HIP (WITH OR WITHOUT PELVIS) 2V BILAT COMPARISON:  Lumbar spine films of 02/11/2016 FINDINGS: There is moderate degenerative joint disease involving both hips with some loss of joint space and sclerosis with spurring. No acute fracture is seen. The pelvic rami are intact. The SI joints appear corticated. IMPRESSION: Moderate degenerative joint disease of the hips. No acute abnormality. Electronically Signed   By: Ivar Drape M.D.   On: 06/26/2016 14:29   Subjective: Denies chest pains. States that her breathing is back to baseline. She was able to lie flat without using oxygen overnight. Denies lower extremity edema.   Discharge Exam: Vitals:   06/30/16 0911 06/30/16 1339  BP:  117/71  Pulse: 75 67  Resp: 18 16  Temp:  97.7 F (36.5 C)   Vitals:    06/29/16 2100 06/30/16 0428 06/30/16 0911 06/30/16 1339  BP: 128/71 (!) 124/54  117/71  Pulse: 88 76 75 67  Resp: 17 18 18 16   Temp: 97.8 F (36.6 C) 98 F (36.7 C)  97.7 F (36.5 C)  TempSrc: Oral Oral  Oral  SpO2: 94% 99% 98% 98%  Weight:  77.4 kg (170 lb 10.2 oz)    Height:        General exam:  Adult female.  No acute distress.  Lying flat in bed without oxygen HEENT:  NCAT, MMM Respiratory system:  Rales that clear with repeat respirations at the bilateral bases, no rhonchi and no wheeze Cardiovascular system: Regular rate and rhythm, normal S1/S2.  Warm extremities Gastrointestinal system: Normal active bowel sounds, soft, nondistended, nontender. MSK:  Normal tone and bulk, no lower extremity edema Neuro:  Grossly intact   The results of significant diagnostics from this hospitalization (including imaging, microbiology, ancillary and laboratory) are listed below for reference.     Microbiology: No results found for this or any previous visit (from the past 240 hour(s)).   Labs: BNP (last 3 results)  Recent Labs  04/06/16 1205 05/31/16 0002 06/26/16 1347  BNP 308.6* 202.1* [id number]*   Basic Metabolic Panel:  Recent Labs Lab 06/26/16 1347 06/27/16 0101 06/28/16 0506 06/29/16 0504 06/30/16 0531  NA 142 140 141 140 139  K 3.4* 3.7 3.7 4.3 4.6  CL 105 104 103 103 102  CO2 26 23 30 28 29   GLUCOSE 134* 191* 100* 94 99  BUN 6 14 26* 26* 27*  CREATININE 0.86 1.06* 1.01* 1.12* 1.15*  CALCIUM 9.2 9.1 8.5* 8.7* 9.0  MG  --   --   --  1.8  --    Liver Function Tests:  Recent Labs Lab 06/26/16 1347  AST 17  ALT 11*  ALKPHOS 140*  BILITOT 1.5*  PROT 6.9  ALBUMIN 4.0   No results  for input(s): LIPASE, AMYLASE in the last 168 hours. No results for input(s): AMMONIA in the last 168 hours. CBC:  Recent Labs Lab 06/26/16 1347  WBC 8.7  NEUTROABS 5.9  HGB 14.4  HCT 45.0  MCV 90.4  PLT 431*   Cardiac Enzymes:  Recent Labs Lab 06/26/16 1347  06/26/16 2017 06/27/16 0101 06/27/16 0639  TROPONINI 0.12* 0.09* 0.07* 0.07*   BNP: Invalid input(s): POCBNP CBG:  Recent Labs Lab 06/29/16 1702 06/29/16 2121 06/30/16 0349 06/30/16 0743 06/30/16 1156  GLUCAP 136* 146* 101* 96 138*   D-Dimer No results for input(s): DDIMER in the last 72 hours. Hgb A1c No results for input(s): HGBA1C in the last 72 hours. Lipid Profile No results for input(s): CHOL, HDL, LDLCALC, TRIG, CHOLHDL, LDLDIRECT in the last 72 hours. Thyroid function studies No results for input(s): TSH, T4TOTAL, T3FREE, THYROIDAB in the last 72 hours.  Invalid input(s): FREET3 Anemia work up No results for input(s): VITAMINB12, FOLATE, FERRITIN, TIBC, IRON, RETICCTPCT in the last 72 hours. Urinalysis    Component Value Date/Time   COLORURINE YELLOW 06/26/2016 1601   APPEARANCEUR CLEAR 06/26/2016 1601   LABSPEC 1.008 06/26/2016 1601   PHURINE 7.0 06/26/2016 1601   GLUCOSEU NEGATIVE 06/26/2016 1601   HGBUR NEGATIVE 06/26/2016 1601   HGBUR negative 01/01/2010 1353   BILIRUBINUR NEGATIVE 06/26/2016 1601   BILIRUBINUR n 05/30/2015 1542   KETONESUR NEGATIVE 06/26/2016 1601   PROTEINUR 30 (A) 06/26/2016 1601   UROBILINOGEN 0.2 05/30/2015 1542   UROBILINOGEN 1.0 11/03/2014 1442   NITRITE NEGATIVE 06/26/2016 1601   LEUKOCYTESUR NEGATIVE 06/26/2016 1601   Sepsis Labs Invalid input(s): PROCALCITONIN,  WBC,  LACTICIDVEN   Time coordinating discharge: Over 30 minutes  SIGNED:   Janece Canterbury, MD  [facility] 06/30/2016, 1:53 PM Pager   If 7PM-7AM, please contact night-coverage [email or web address] Password [email or web address]

## 2016-06-30 NOTE — Care Management Important Message (Signed)
Important Message  Patient Details  Name: JOHNIYAH UPTON MRN: [id number] Date of Birth: 16-Aug-1935   Medicare Important Message Given:  Yes    Kerin Salen 06/30/2016, 1:36 PMImportant Message  Patient Details  Name: REGINNA ESTAY MRN: [id number] Date of Birth: 26-Sep-1935   Medicare Important Message Given:  Yes    Kerin Salen 06/30/2016, 1:36 PM

## 2016-06-30 NOTE — Progress Notes (Signed)
Pt will discharge with [facility].

## 2016-07-01 ENCOUNTER — Ambulatory Visit (INDEPENDENT_AMBULATORY_CARE_PROVIDER_SITE_OTHER): Payer: Medicare Other

## 2016-07-01 ENCOUNTER — Telehealth: Payer: Self-pay

## 2016-07-01 DIAGNOSIS — I2510 Atherosclerotic heart disease of native coronary artery without angina pectoris: Secondary | ICD-10-CM | POA: Diagnosis not present

## 2016-07-01 DIAGNOSIS — Z95810 Presence of automatic (implantable) cardiac defibrillator: Secondary | ICD-10-CM

## 2016-07-01 DIAGNOSIS — Z955 Presence of coronary angioplasty implant and graft: Secondary | ICD-10-CM | POA: Diagnosis not present

## 2016-07-01 DIAGNOSIS — Z7982 Long term (current) use of aspirin: Secondary | ICD-10-CM | POA: Diagnosis not present

## 2016-07-01 DIAGNOSIS — Z794 Long term (current) use of insulin: Secondary | ICD-10-CM | POA: Diagnosis not present

## 2016-07-01 DIAGNOSIS — I252 Old myocardial infarction: Secondary | ICD-10-CM | POA: Diagnosis not present

## 2016-07-01 DIAGNOSIS — I130 Hypertensive heart and chronic kidney disease with heart failure and stage 1 through stage 4 chronic kidney disease, or unspecified chronic kidney disease: Secondary | ICD-10-CM | POA: Diagnosis not present

## 2016-07-01 DIAGNOSIS — G894 Chronic pain syndrome: Secondary | ICD-10-CM | POA: Diagnosis not present

## 2016-07-01 DIAGNOSIS — I5040 Unspecified combined systolic (congestive) and diastolic (congestive) heart failure: Secondary | ICD-10-CM | POA: Diagnosis not present

## 2016-07-01 DIAGNOSIS — N183 Chronic kidney disease, stage 3 (moderate): Secondary | ICD-10-CM | POA: Diagnosis not present

## 2016-07-01 DIAGNOSIS — E1122 Type 2 diabetes mellitus with diabetic chronic kidney disease: Secondary | ICD-10-CM | POA: Diagnosis not present

## 2016-07-01 DIAGNOSIS — I255 Ischemic cardiomyopathy: Secondary | ICD-10-CM | POA: Diagnosis not present

## 2016-07-01 NOTE — Progress Notes (Signed)
EPIC Encounter for ICM Monitoring  Patient Name: Sydney Phillips is a 81 y.o. female Date: 07/01/2016 Primary Care Physican: Alysia Penna, MD Primary Cardiologist: Croitoru Electrophysiologist: Croitoru Dry Weight:    unknown Bi-V Pacing:  89.2% (dropped from 96.5% on 06/19/2016)   OBSERVATIONS   V. Pacing less than 90%.    Patient Activity less than 1 hr/day for 1 weeks.   Ventricular sensing episodes averaged 1.4 hr/day since the last session.       Attempted ICM call and unable to reach.  Transmission reviewed.    Patient was hospitalized for CHF and per discharge note on 07/01/16, she was diuresed over 5-1/2 L discharge weight 170 lbs on the date of discharge.  She is diuresed approximately 1 L per day on Lasix 40 mg by mouth twice a day.  Baseline weight 163 lbs - 165 lbs.  Thoracic impedance abnormal suggesting fluid accumulation.  Labs: 06/30/2016 Creatinine 1.15, BUN 27, Potassium 4.6, Sodium 139, EGFR 44-51 06/29/2016 Creatinine 1.12, BUN 26, Potassium 4.3, Sodium 140, EGFR 46-53  06/28/2016 Creatinine 1.01, BUN 26, Potassium 3.7, Sodium 141, EGFR 52->60  06/27/2016 Creatinine 1.06, BUN 14, Potassium 3.7, Sodium 140, EGFR 49-57  06/26/2016 Creatinine 0.86, BUN 6, Potassium 3.4, Sodium 142, EGFR >60  06/20/2016 Creatinine 0.83, BUN 7, Potassium 3.8, Sodium 144  05/31/2016 Creatinine 1.16, BUN 19, Potassium 3.8, Sodium 143, EGFR 44-51  05/30/2016 Creatinine 1.48, BUN 26, Potassium 3.3, Sodium 137, EGFR 33-38  05/29/2016 Creatinine 1.48, BUN 27, Potassium 3.6, Sodium 140  05/23/2016 Creatinine 1.21, BUN 27, Potassium 3.4, Sodium 140, EGFR 42-48  05/22/2016 Creatinine 1.27, BUN 24, Potassium 3.6, Sodium 141, EGFR 39-46 05/21/2016 Creatinine 1.42, BUN 32, Potassium 4.0, Sodium 143, EGFR 34-40  05/20/2016 Creatinine 1.27, EGFR 39-46  05/20/2016 Creatinine 1.45, BUN 43, Potassium 4.1, Sodium 139, EGFR 34-39  05/19/2016 Creatinine 1.46, BUN 49, Potassium 3.9, Sodium 140, EGFR  33-39   Recommendations:  No answer or answering machine.  Unable to reach.   Follow-up plan: ICM clinic phone appointment on 07/07/2016.  Post hospital office visit scheduled on 07/07/2016 with Rosaria Ferries, PA.  Copy of ICM check sent to primary cardiologist and device physician.   3 month ICM trend: 06/30/2016   1 Year ICM trend:      [name], RN 07/01/2016 8:37 AM

## 2016-07-01 NOTE — Telephone Encounter (Signed)
Remote ICM transmission received.  Attempted patient call post hospital discharge and no answer or answering machine.

## 2016-07-04 ENCOUNTER — Ambulatory Visit: Payer: Medicare Other | Admitting: Neurology

## 2016-07-04 DIAGNOSIS — Z955 Presence of coronary angioplasty implant and graft: Secondary | ICD-10-CM | POA: Diagnosis not present

## 2016-07-04 DIAGNOSIS — Z95810 Presence of automatic (implantable) cardiac defibrillator: Secondary | ICD-10-CM | POA: Diagnosis not present

## 2016-07-04 DIAGNOSIS — G894 Chronic pain syndrome: Secondary | ICD-10-CM | POA: Diagnosis not present

## 2016-07-04 DIAGNOSIS — Z7982 Long term (current) use of aspirin: Secondary | ICD-10-CM | POA: Diagnosis not present

## 2016-07-04 DIAGNOSIS — E1122 Type 2 diabetes mellitus with diabetic chronic kidney disease: Secondary | ICD-10-CM | POA: Diagnosis not present

## 2016-07-04 DIAGNOSIS — I2510 Atherosclerotic heart disease of native coronary artery without angina pectoris: Secondary | ICD-10-CM | POA: Diagnosis not present

## 2016-07-04 DIAGNOSIS — I130 Hypertensive heart and chronic kidney disease with heart failure and stage 1 through stage 4 chronic kidney disease, or unspecified chronic kidney disease: Secondary | ICD-10-CM | POA: Diagnosis not present

## 2016-07-04 DIAGNOSIS — Z794 Long term (current) use of insulin: Secondary | ICD-10-CM | POA: Diagnosis not present

## 2016-07-04 DIAGNOSIS — I255 Ischemic cardiomyopathy: Secondary | ICD-10-CM | POA: Diagnosis not present

## 2016-07-04 DIAGNOSIS — I5042 Chronic combined systolic (congestive) and diastolic (congestive) heart failure: Secondary | ICD-10-CM | POA: Diagnosis not present

## 2016-07-04 DIAGNOSIS — I252 Old myocardial infarction: Secondary | ICD-10-CM | POA: Diagnosis not present

## 2016-07-04 DIAGNOSIS — N183 Chronic kidney disease, stage 3 (moderate): Secondary | ICD-10-CM | POA: Diagnosis not present

## 2016-07-04 DIAGNOSIS — I5040 Unspecified combined systolic (congestive) and diastolic (congestive) heart failure: Secondary | ICD-10-CM | POA: Diagnosis not present

## 2016-07-07 ENCOUNTER — Ambulatory Visit: Payer: Medicare Other | Admitting: Physician Assistant

## 2016-07-07 ENCOUNTER — Telehealth: Payer: Self-pay | Admitting: Family Medicine

## 2016-07-07 ENCOUNTER — Telehealth: Payer: Self-pay

## 2016-07-07 ENCOUNTER — Ambulatory Visit (INDEPENDENT_AMBULATORY_CARE_PROVIDER_SITE_OTHER): Payer: Medicare Other

## 2016-07-07 ENCOUNTER — Encounter: Payer: Self-pay | Admitting: *Deleted

## 2016-07-07 DIAGNOSIS — Z95810 Presence of automatic (implantable) cardiac defibrillator: Secondary | ICD-10-CM

## 2016-07-07 NOTE — Telephone Encounter (Signed)
Attempted ICM call to patient and left message to send remote transmission to check fluid levels prior to PA visit this morning.  Provided ICM call back number.

## 2016-07-07 NOTE — Telephone Encounter (Signed)
° ° ° °  Sydney Phillips a PT with [facility] call to ask for verbal orders to continue seeing her 1 week 1 2 week 3   [phone]

## 2016-07-07 NOTE — Progress Notes (Deleted)
Cardiology Office Note   Date:  07/07/2016   ID:  Lazelle, Orrock [birth year], MRN [id number]  PCP:  Alysia Penna, MD  Cardiologist:  Dr Hughie Closs, Suanne Marker, PA-C   No chief complaint on file.   History of Present Illness: Sydney BACIGALUPO is a 81 y.o. female with a history of NSTEMI s/p DES to RCA/Cx 05/2016, COPD with chronic respiratory failure, OSA, HTN, HLD, LBBB, h/o mixed cardiomyopathy s/p Medtronic CRT-D (original ICD 2009, s/p device change out 2013), chronic combined CHF, L CEA, history of L subclavian artery stenosis s/p stent in February 2013, PADs/p right fem-pop 2015, chronic pain, DM, CKD stage III, tobacco abuse, DNR   Hospitalized 01/11-01/15 for CHF, cards saw, d/c weight 170 lbs  [name] presents for ***   Past Medical History:  Diagnosis Date  . Arthritis   . Automatic implantable cardioverter-defibrillator in situ   . Bronchitis   . CAD (coronary artery disease)    a. s/p DES to LCx/RCA 05/2016, ostial LAD disease.  . Cardiomyopathy ([facility])   . Chronic back pain   . Chronic combined systolic and diastolic CHF (congestive heart failure) ([place])   . Chronic constipation   . Chronic pain   . CKD (chronic kidney disease), stage III   . Colon polyps 2003.  2015.   HP polyps 2003.  adnomas 2015.  required referal to [facility] for colonoscopic resection of flat polyps.   Marland Kitchen COPD (chronic obstructive pulmonary disease) ([place])   . Dementia   . Depression with anxiety    takes Cymbalta daily  . Diabetes mellitus ([place])   . Early cataracts, bilateral   . Fibromyalgia   . GERD (gastroesophageal reflux disease)    was on meds but was taken off;now watches what she eats  . Hemorrhoids   . History of kidney stones   . Hx of colonic polyps   . Hyperlipemia    takes Crestor daily  . Hypertension    takes Amlodipine and Metoprolol daily  . Insomnia   . LBBB (left bundle branch block)    Stress test 09/03/2010, EF 55  . Myocardial infarction    . PAD (peripheral artery disease) ([facility])    Carotid, subclavian, and lower extremity beds, currently not symptomatic  . Presence of combination internal cardiac defibrillator (ICD) and pacemaker   . Presence of permanent cardiac pacemaker   . Pulmonary hypertension   . S/P angioplasty with stent, lt. subclavian 07/31/11 08/01/2011  . Subclavian arterial stenosis, lt, with PTA/STENT 07/31/11 08/01/2011  . Syncope 07/28/2011   EF - 50-55, moderate concentric hypertrophy in left ventricle  . Urinary incontinence   . Vertigo    takes Meclizine prn    Past Surgical History:  Procedure Laterality Date  . ABDOMINAL AORTAGRAM N/A 08/12/2013   Procedure: ABDOMINAL Maxcine Ham;  Surgeon: Elam Dutch, MD;  Location: [facility] CATH LAB;  Service: Cardiovascular;  Laterality: N/A;  . ABDOMINAL HYSTERECTOMY    . APPENDECTOMY    . BACK SURGERY  2012  . BIV ICD GENERTAOR CHANGE OUT Left 02/20/2012   Procedure: BIV ICD GENERTAOR CHANGE OUT;  Surgeon: [name], MD;  Location: [facility] CATH LAB;  Service: Cardiovascular;  Laterality: Left;  . CARDIAC CATHETERIZATION  12/01/2007   By Dr. Melvern Banker, left heart cath,   . CARDIAC CATHETERIZATION N/A 05/20/2016   Procedure: Right/Left Heart Cath and Coronary Angiography;  Surgeon: Sherren Mocha, MD;  Location: [place] CV LAB;  Service: Cardiovascular;  Laterality:  N/A;  . CARDIAC CATHETERIZATION N/A 05/20/2016   Procedure: Coronary Stent Intervention;  Surgeon: Sherren Mocha, MD;  Location: [place] CV LAB;  Service: Cardiovascular;  Laterality: N/A;  . CARDIAC DEFIBRILLATOR PLACEMENT  05/2008   By Dr Blanch Media, Medtr[employer] CANNOT HAVE MRI's  . CAROTID ANGIOGRAM N/A 07/31/2011   Procedure: CAROTID ANGIOGRAM;  Surgeon: Lorretta Harp, MD;  Location: [facility] CATH LAB;  Service: Cardiovascular;  Laterality: N/A;  carotid angiogram and possible Lt SCA PTA  . COLONOSCOPY W/ POLYPECTOMY  12/2013  . CORONARY ANGIOPLASTY    . ENDARTERECTOMY Left 11/08/2014   Procedure: LEFT  CAROTID ENDARTERECTOMY WITH HEMASHIELD PATCH ANGIOPLASTY;  Surgeon: Elam Dutch, MD;  Location: [place];  Service: Vascular;  Laterality: Left;  . ESOPHAGOGASTRODUODENOSCOPY N/A 01/20/2014   Procedure: ESOPHAGOGASTRODUODENOSCOPY (EGD);  Surgeon: Jerene Bears, MD;  Location: [facility] ENDOSCOPY;  Service: Endoscopy;  Laterality: N/A;  . FEMORAL-POPLITEAL BYPASS GRAFT Right 10/12/2013   Procedure:   Femoral-Peroneal trunk  bypass with nonreversed greater saphenous vein graft;  Surgeon: Elam Dutch, MD;  Location: [place];  Service: Vascular;  Laterality: Right;  . GIVENS CAPSULE STUDY N/A 01/20/2014   Procedure: GIVENS CAPSULE STUDY;  Surgeon: Jerene Bears, MD;  Location: [place];  Service: Gastroenterology;  Laterality: N/A;  . INTRAOPERATIVE ARTERIOGRAM Right 10/12/2013   Procedure: INTRA OPERATIVE ARTERIOGRAM;  Surgeon: Elam Dutch, MD;  Location: [place];  Service: Vascular;  Laterality: Right;  . ORIF ELBOW FRACTURE  08/16/2011   Procedure: OPEN REDUCTION INTERNAL FIXATION (ORIF) ELBOW/OLECRANON FRACTURE;  Surgeon: Schuyler Amor, MD;  Location: [place];  Service: Orthopedics;  Laterality: Left;  . RENAL ANGIOGRAM N/A 08/12/2013   Procedure: RENAL ANGIOGRAM;  Surgeon: Elam Dutch, MD;  Location: [facility] CATH LAB;  Service: Cardiovascular;  Laterality: N/A;  . SUBCLAVIAN STENT PLACEMENT Left 07/31/2011   7x18 Genesis, balloon, with reduction of 90% ostial left subclavian artery stenosis to 0% with residual excellent flow  . TONSILLECTOMY    . TUBAL LIGATION      Current Outpatient Prescriptions  Medication Sig Dispense Refill  . albuterol (PROVENTIL HFA;VENTOLIN HFA) 108 (90 Base) MCG/ACT inhaler Inhale 2 puffs into the lungs every 4 (four) hours as needed for wheezing or shortness of breath. 1 Inhaler 5  . allopurinol (ZYLOPRIM) 300 MG tablet Take 1 tablet (300 mg total) by mouth daily. 30 tablet 11  . ALPRAZolam (XANAX) 0.5 MG tablet Take 2 tablets (1 mg total) by mouth at bedtime as needed  for anxiety. 60 tablet 5  . aspirin 81 MG chewable tablet Chew 1 tablet (81 mg total) by mouth daily. (Patient not taking: Reported on 06/26/2016) 30 tablet 1  . atorvastatin (LIPITOR) 40 MG tablet Take 1 tablet (40 mg total) by mouth daily at 6 PM. 90 tablet 3  . bisoprolol (ZEBETA) 5 MG tablet Take 1 tablet (5 mg total) by mouth daily. 90 tablet 3  . clopidogrel (PLAVIX) 75 MG tablet Take 75 mg by mouth daily.    . DULoxetine (CYMBALTA) 60 MG capsule TAKE ONE CAPSULE BY MOUTH TWICE DAILY (Patient taking differently: TAKE 60mg  CAPSULE BY MOUTH TWICE DAILY) 60 capsule 11  . fluticasone (FLONASE) 50 MCG/ACT nasal spray Place 1 spray into both nostrils daily as needed for allergies.     . furosemide (LASIX) 40 MG tablet Take 1 tablet (40 mg total) by mouth 2 (two) times daily. 60 tablet 0  . HYDROcodone-acetaminophen (NORCO) 10-325 MG tablet Take one tablet by mouth every 6  hours as needed for pain. DNE 3gm of APAP from all sources/24hours 8 tablet 0  . ipratropium-albuterol (DUONEB) 0.5-2.5 (3) MG/3ML SOLN Take 3 mLs by nebulization 3 (three) times daily. (Patient not taking: Reported on 06/26/2016) 360 mL 1  . lisinopril (PRINIVIL,ZESTRIL) 5 MG tablet Take 1 tablet (5 mg total) by mouth daily. 30 tablet 0  . nitroGLYCERIN (NITROSTAT) 0.4 MG SL tablet Place 1 tablet (0.4 mg total) under the tongue every 5 (five) minutes x 3 doses as needed for chest pain. 30 tablet 11  . potassium chloride SA (K-DUR,KLOR-CON) 20 MEQ tablet Take 1 tablet (20 mEq total) by mouth daily. (Patient not taking: Reported on 06/26/2016) 90 tablet 3   No current facility-administered medications for this visit.     Allergies:   Potassium-containing compounds; Azithromycin; Codeine; Darvon; Erythromycin; Meloxicam; Norco [hydrocodone-acetaminophen]; Penicillins; Propoxyphene n-acetaminophen; Rofecoxib; Rosuvastatin; Statins; and Sulfa antibiotics    Social History:  The patient  reports that she has been smoking Cigarettes.   She has a 62.00 pack-year smoking history. She has never used smokeless tobacco. She reports that she does not drink alcohol or use drugs.   Family History:  The patient's family history includes CAD in her father; Cancer in her sister; Colon cancer in her maternal grandmother; Deep vein thrombosis in her son; Diabetes in her sister; Heart disease in her father, mother, and sister; Hyperlipidemia in her father.    ROS:  Please see the history of present illness. All other systems are reviewed and negative.    PHYSICAL EXAM: VS:  There were no vitals taken for this visit. , BMI There is no height or weight on file to calculate BMI. GEN: Well nourished, well developed, female in no acute distress  HEENT: normal for age  Neck: no JVD, no carotid bruit, no masses Cardiac: RRR; no murmur, no rubs, or gallops Respiratory:  clear to auscultation bilaterally, normal work of breathing GI: soft, nontender, nondistended, + BS MS: no deformity or atrophy; no edema; distal pulses are 2+ in all 4 extremities   Skin: warm and dry, no rash Neuro:  Strength and sensation are intact Psych: euthymic mood, full affect   EKG:  EKG {ACTION; IS/IS GI:087931 ordered today. The ekg ordered today demonstrates ***   Recent Labs: 05/14/2016: TSH 1.013 06/26/2016: ALT 11; B Natriuretic Peptide 963.2; Hemoglobin 14.4; Platelets 431 06/29/2016: Magnesium 1.8 06/30/2016: BUN 27; Creatinine, Ser 1.15; Potassium 4.6; Sodium 139    Lipid Panel    Component Value Date/Time   CHOL 193 05/15/2016 0248   TRIG 116 05/15/2016 0248   HDL 25 (L) 05/15/2016 0248   CHOLHDL 7.7 05/15/2016 0248   VLDL 23 05/15/2016 0248   LDLCALC 145 (H) 05/15/2016 0248   LDLDIRECT 152.0 05/30/2015 1452     Wt Readings from Last 3 Encounters:  06/30/16 170 lb 10.2 oz (77.4 kg)  06/20/16 163 lb (73.9 kg)  06/13/16 171 lb 9.6 oz (77.8 kg)     Other studies Reviewed: Additional studies/ records that were reviewed today include:  ***.  ASSESSMENT AND PLAN:  1.  ***   Current medicines are reviewed at length with the patient today.  The patient {ACTIONS; HAS/DOES NOT HAVE:19233} concerns regarding medicines.  The following changes have been made:  {PLAN; NO CHANGE:13088:s}  Labs/ tests ordered today include: *** No orders of the defined types were placed in this encounter.    Disposition:   FU with ***  Signed, Rosaria Ferries, PA-C  07/07/2016 8:47 AM  [place] Phone: [phone]; Fax: [phone]  This note was written with the assistance of speech recognition software. Please excuse any transcriptional errors.

## 2016-07-08 ENCOUNTER — Telehealth: Payer: Self-pay

## 2016-07-08 DIAGNOSIS — E1122 Type 2 diabetes mellitus with diabetic chronic kidney disease: Secondary | ICD-10-CM | POA: Diagnosis not present

## 2016-07-08 DIAGNOSIS — I252 Old myocardial infarction: Secondary | ICD-10-CM | POA: Diagnosis not present

## 2016-07-08 DIAGNOSIS — N183 Chronic kidney disease, stage 3 (moderate): Secondary | ICD-10-CM | POA: Diagnosis not present

## 2016-07-08 DIAGNOSIS — G894 Chronic pain syndrome: Secondary | ICD-10-CM | POA: Diagnosis not present

## 2016-07-08 DIAGNOSIS — Z7982 Long term (current) use of aspirin: Secondary | ICD-10-CM | POA: Diagnosis not present

## 2016-07-08 DIAGNOSIS — I2510 Atherosclerotic heart disease of native coronary artery without angina pectoris: Secondary | ICD-10-CM | POA: Diagnosis not present

## 2016-07-08 DIAGNOSIS — I5040 Unspecified combined systolic (congestive) and diastolic (congestive) heart failure: Secondary | ICD-10-CM | POA: Diagnosis not present

## 2016-07-08 DIAGNOSIS — Z955 Presence of coronary angioplasty implant and graft: Secondary | ICD-10-CM | POA: Diagnosis not present

## 2016-07-08 DIAGNOSIS — Z794 Long term (current) use of insulin: Secondary | ICD-10-CM | POA: Diagnosis not present

## 2016-07-08 DIAGNOSIS — Z95810 Presence of automatic (implantable) cardiac defibrillator: Secondary | ICD-10-CM | POA: Diagnosis not present

## 2016-07-08 DIAGNOSIS — I255 Ischemic cardiomyopathy: Secondary | ICD-10-CM | POA: Diagnosis not present

## 2016-07-08 DIAGNOSIS — I130 Hypertensive heart and chronic kidney disease with heart failure and stage 1 through stage 4 chronic kidney disease, or unspecified chronic kidney disease: Secondary | ICD-10-CM | POA: Diagnosis not present

## 2016-07-08 NOTE — Progress Notes (Signed)
EPIC Encounter for ICM Monitoring  Patient Name: Sydney Phillips is a 81 y.o. female Date: 07/08/2016 Primary Care Physican: Alysia Penna, MD Primary Cardiologist:Croitoru Electrophysiologist: Croitoru Dry Weight:unknown Bi-V Pacing: 95.4%      Attempted ICM call and unable to reach.  Left message to return call.  Transmission reviewed.  Hospital discharge on 06/30/2016 and diuresed over 5-1/2 L.  Discharge weight 170 lbs and baseline  weight is 163 lbs - 165 lbs.  Thoracic impedance abnormal suggesting fluid accumulation but has improved since 06/30/2016.  Labs: 06/30/2016 Creatinine 1.15, BUN 27, Potassium 4.6, Sodium 139, EGFR 44-51 06/29/2016 Creatinine 1.12, BUN 26, Potassium 4.3, Sodium 140, EGFR 46-53  06/28/2016 Creatinine 1.01, BUN 26, Potassium 3.7, Sodium 141, EGFR 52->60  06/27/2016 Creatinine 1.06, BUN 14, Potassium 3.7, Sodium 140, EGFR 49-57  06/26/2016 Creatinine 0.86, BUN 6, Potassium 3.4, Sodium 142, EGFR >60  06/20/2016 Creatinine 0.83, BUN 7, Potassium 3.8, Sodium 144  05/31/2016 Creatinine 1.16, BUN 19, Potassium 3.8, Sodium 143, EGFR 44-51  05/30/2016 Creatinine 1.48, BUN 26, Potassium 3.3, Sodium 137, EGFR 33-38  05/29/2016 Creatinine 1.48, BUN 27, Potassium 3.6, Sodium 140  05/23/2016 Creatinine 1.21, BUN 27, Potassium 3.4, Sodium 140, EGFR 42-48  05/22/2016 Creatinine 1.27, BUN 24, Potassium 3.6, Sodium 141, EGFR 39-46 05/21/2016 Creatinine 1.42, BUN 32, Potassium 4.0, Sodium 143, EGFR 34-40  05/20/2016 Creatinine 1.27, EGFR 39-46  05/20/2016 Creatinine 1.45, BUN 43, Potassium 4.1, Sodium 139, EGFR 34-39  05/19/2016 Creatinine 1.46, BUN 49, Potassium 3.9, Sodium 140, EGFR 33-39   Recommendations:   NONE - Unable to reach patient   Follow-up plan: ICM clinic phone appointment on 07/17/2016 to recheck fluid levels.  Patient rescheduled office appointment from 07/07/2016 to 07/11/2016 with Rosaria Ferries, PA.  Copy of ICM check sent to primary cardiologist and  device physician.   3 month ICM trend: 07/07/2016   1 Year ICM trend:      [name], RN 07/08/2016 8:14 AM

## 2016-07-08 NOTE — Telephone Encounter (Signed)
Remote ICM transmission received.  Attempted patient call and left message to return call.   

## 2016-07-08 NOTE — Progress Notes (Signed)
She did not show up for her 1/22 appt. Rescheduled for Friday 25th. MCr

## 2016-07-08 NOTE — Telephone Encounter (Signed)
Spoke with home care and gave verbal orders, okay per Dr. Sarajane Jews.

## 2016-07-09 ENCOUNTER — Telehealth: Payer: Self-pay | Admitting: Family Medicine

## 2016-07-09 ENCOUNTER — Ambulatory Visit (INDEPENDENT_AMBULATORY_CARE_PROVIDER_SITE_OTHER): Payer: Medicare Other | Admitting: Family Medicine

## 2016-07-09 ENCOUNTER — Encounter: Payer: Self-pay | Admitting: Family Medicine

## 2016-07-09 VITALS — BP 132/87 | HR 95 | Ht 65.0 in | Wt 167.0 lb

## 2016-07-09 DIAGNOSIS — N183 Chronic kidney disease, stage 3 unspecified: Secondary | ICD-10-CM

## 2016-07-09 DIAGNOSIS — Z794 Long term (current) use of insulin: Secondary | ICD-10-CM | POA: Diagnosis not present

## 2016-07-09 DIAGNOSIS — Z7982 Long term (current) use of aspirin: Secondary | ICD-10-CM | POA: Diagnosis not present

## 2016-07-09 DIAGNOSIS — I255 Ischemic cardiomyopathy: Secondary | ICD-10-CM

## 2016-07-09 DIAGNOSIS — G894 Chronic pain syndrome: Secondary | ICD-10-CM | POA: Diagnosis not present

## 2016-07-09 DIAGNOSIS — Z95810 Presence of automatic (implantable) cardiac defibrillator: Secondary | ICD-10-CM | POA: Diagnosis not present

## 2016-07-09 DIAGNOSIS — N170 Acute kidney failure with tubular necrosis: Secondary | ICD-10-CM | POA: Diagnosis not present

## 2016-07-09 DIAGNOSIS — I5043 Acute on chronic combined systolic (congestive) and diastolic (congestive) heart failure: Secondary | ICD-10-CM

## 2016-07-09 DIAGNOSIS — J441 Chronic obstructive pulmonary disease with (acute) exacerbation: Secondary | ICD-10-CM

## 2016-07-09 DIAGNOSIS — E1122 Type 2 diabetes mellitus with diabetic chronic kidney disease: Secondary | ICD-10-CM | POA: Diagnosis not present

## 2016-07-09 DIAGNOSIS — E118 Type 2 diabetes mellitus with unspecified complications: Secondary | ICD-10-CM

## 2016-07-09 DIAGNOSIS — I130 Hypertensive heart and chronic kidney disease with heart failure and stage 1 through stage 4 chronic kidney disease, or unspecified chronic kidney disease: Secondary | ICD-10-CM | POA: Diagnosis not present

## 2016-07-09 DIAGNOSIS — I2510 Atherosclerotic heart disease of native coronary artery without angina pectoris: Secondary | ICD-10-CM | POA: Diagnosis not present

## 2016-07-09 DIAGNOSIS — I252 Old myocardial infarction: Secondary | ICD-10-CM | POA: Diagnosis not present

## 2016-07-09 DIAGNOSIS — Z955 Presence of coronary angioplasty implant and graft: Secondary | ICD-10-CM | POA: Diagnosis not present

## 2016-07-09 DIAGNOSIS — I5040 Unspecified combined systolic (congestive) and diastolic (congestive) heart failure: Secondary | ICD-10-CM | POA: Diagnosis not present

## 2016-07-09 LAB — BASIC METABOLIC PANEL
BUN: 19 mg/dL (ref 6–23)
CALCIUM: 10.1 mg/dL (ref 8.4–10.5)
CO2: 30 mEq/L (ref 19–32)
Chloride: 103 mEq/L (ref 96–112)
Creatinine, Ser: 1.19 mg/dL (ref 0.40–1.20)
GFR: 46.57 mL/min — AB (ref >60.00)
GLUCOSE: 107 mg/dL — AB (ref 70–99)
Potassium: 4.4 mEq/L (ref 3.5–5.1)
SODIUM: 140 meq/L (ref 135–145)

## 2016-07-09 MED ORDER — HYDROCODONE-ACETAMINOPHEN 10-325 MG PO TABS: ORAL_TABLET | ORAL | 0 refills | Status: DC

## 2016-07-09 MED ORDER — FUROSEMIDE 80 MG PO TABS: 80.0000 mg | ORAL_TABLET | Freq: Every day | ORAL | 3 refills | Status: DC

## 2016-07-09 NOTE — Progress Notes (Signed)
   Subjective:    Patient ID: Sydney Phillips, female    DOB: July 27, 1935, 81 y.o.   MRN: [id number]  HPI Here to follow up a hospital stay from 06-26-16 to 06-30-16 for an acute exacerbation of chronic systolic and diastolic failure. Her baseline EF was 20-25%. She was diuresed with IV lasix and lost 5 and 1/2 liters of fluid. Her renal status remained stable through all this. Her BUN, creatinine, and GFR at DC were stable at 27, 1.15, and 44 respectively. She was sent home on Lasix 40 mg bid, but she has actually been taking 80 mg once a day since she had these at home. She feels great with no swelling and no SOB. She gets around well with her walker and sh is still doing PT twice a week.    Review of Systems  Constitutional: Negative.   Respiratory: Negative.   Cardiovascular: Negative.   Gastrointestinal: Negative.   Neurological: Negative.        Objective:   Physical Exam  Constitutional: She is oriented to person, place, and time. She appears well-developed and well-nourished.  Using her walker   Cardiovascular: Normal rate, regular rhythm, normal heart sounds and intact distal pulses.   Pulmonary/Chest: Effort normal and breath sounds normal. No respiratory distress. She has no wheezes. She has no rales.  Abdominal: Soft. Bowel sounds are normal. She exhibits no distension and no mass. There is no tenderness. There is no rebound and no guarding.  Musculoskeletal: She exhibits no edema.  Neurological: She is alert and oriented to person, place, and time.          Assessment & Plan:  She is doing quite well in general. Her CHF is well controlled on 80 mg of Lasix daily. She takes 20 mEq of potassium daily with this. We will get a BMET today to check her potassium level and renal function. HTN and CAD is stable. COPD is stable. She uses [place] oxygen only as needed. Her diabetes has been stable. Her mental status is clear.  Alysia Penna, MD

## 2016-07-09 NOTE — Telephone Encounter (Signed)
° ° °  Pt request refill of the following: ° ° °HYDROcodone-acetaminophen (NORCO) 10-325 MG tablet ° ° °Phamacy: °

## 2016-07-09 NOTE — Telephone Encounter (Signed)
done

## 2016-07-09 NOTE — Progress Notes (Signed)
Pre visit review using our clinic review tool, if applicable. No additional management support is needed unless otherwise documented below in the visit note. 

## 2016-07-10 DIAGNOSIS — Z7982 Long term (current) use of aspirin: Secondary | ICD-10-CM | POA: Diagnosis not present

## 2016-07-10 DIAGNOSIS — I255 Ischemic cardiomyopathy: Secondary | ICD-10-CM | POA: Diagnosis not present

## 2016-07-10 DIAGNOSIS — Z794 Long term (current) use of insulin: Secondary | ICD-10-CM | POA: Diagnosis not present

## 2016-07-10 DIAGNOSIS — I2510 Atherosclerotic heart disease of native coronary artery without angina pectoris: Secondary | ICD-10-CM | POA: Diagnosis not present

## 2016-07-10 DIAGNOSIS — E1122 Type 2 diabetes mellitus with diabetic chronic kidney disease: Secondary | ICD-10-CM | POA: Diagnosis not present

## 2016-07-10 DIAGNOSIS — I130 Hypertensive heart and chronic kidney disease with heart failure and stage 1 through stage 4 chronic kidney disease, or unspecified chronic kidney disease: Secondary | ICD-10-CM | POA: Diagnosis not present

## 2016-07-10 DIAGNOSIS — Z95810 Presence of automatic (implantable) cardiac defibrillator: Secondary | ICD-10-CM | POA: Diagnosis not present

## 2016-07-10 DIAGNOSIS — Z955 Presence of coronary angioplasty implant and graft: Secondary | ICD-10-CM | POA: Diagnosis not present

## 2016-07-10 DIAGNOSIS — I252 Old myocardial infarction: Secondary | ICD-10-CM | POA: Diagnosis not present

## 2016-07-10 DIAGNOSIS — G894 Chronic pain syndrome: Secondary | ICD-10-CM | POA: Diagnosis not present

## 2016-07-10 DIAGNOSIS — N183 Chronic kidney disease, stage 3 (moderate): Secondary | ICD-10-CM | POA: Diagnosis not present

## 2016-07-10 DIAGNOSIS — I5040 Unspecified combined systolic (congestive) and diastolic (congestive) heart failure: Secondary | ICD-10-CM | POA: Diagnosis not present

## 2016-07-10 NOTE — Telephone Encounter (Signed)
Script is ready for pick up here at front office and I left a voice message with this information.  

## 2016-07-11 ENCOUNTER — Encounter: Payer: Self-pay | Admitting: Physician Assistant

## 2016-07-11 ENCOUNTER — Ambulatory Visit (INDEPENDENT_AMBULATORY_CARE_PROVIDER_SITE_OTHER): Payer: Medicare Other | Admitting: Physician Assistant

## 2016-07-11 VITALS — BP 122/74 | HR 96 | Ht 65.0 in | Wt 168.4 lb

## 2016-07-11 DIAGNOSIS — Z720 Tobacco use: Secondary | ICD-10-CM

## 2016-07-11 DIAGNOSIS — N183 Chronic kidney disease, stage 3 unspecified: Secondary | ICD-10-CM

## 2016-07-11 DIAGNOSIS — I5042 Chronic combined systolic (congestive) and diastolic (congestive) heart failure: Secondary | ICD-10-CM

## 2016-07-11 NOTE — Patient Instructions (Addendum)
Medication Instructions:  Your physician recommends that you continue on your current medications as directed. Please refer to the Current Medication list given to you today.  Labwork: NONE  Testing/Procedures: NONE  Follow-Up: Your physician wants you to follow-up in: [place]. You will receive a reminder letter in the mail two months in advance. If you don't receive a letter, please call our office to schedule the follow-up appointment.\  Any Other Special Instructions Will Be Listed Below (If Applicable).  Call our office if you begin having shortness of breath or notice an increase in your weight (increase of 3lbs overnight or 5lbs in one week).  Limit fluid intake to 4 pints per day (includes tea, coffee, soda, etc. )    If you need a refill on your cardiac medications before your next appointment, please call your pharmacy.

## 2016-07-11 NOTE — Progress Notes (Addendum)
Cardiology Office Note   Date:  07/11/2016   ID:  Shailah, Wiederkehr 1935/10/04, MRN [id number]  PCP:  Alysia Penna, MD  Cardiologist:  Dr Sallyanne Kuster 06/19/2016 Rosaria Ferries, PA-C   Chief Complaint  Patient presents with  . Follow-up    History of Present Illness: Sydney Phillips is a 81 y.o. female with a history of NSTEMI s/p DES to RCA/Cx 05/2016, COPD with chronic respiratory failure, OSA, HTN, HLD, LBBB, h/o mixed cardiomyopathy s/p Medtronic CRT-D (2009, s/p device change 2013), chronic S-D-CHF, L CEA, L subclavian artery stent 07/2011, PADs/p right fem-pop 2015, chronic pain, DM, CKD stage III, tobacco abuse   Admit 01/11-01/16/2018 for CHF, d/c wt 170 lbs, base wt felt 163. Optivol had decreased but was trending up at d/c. BiV pacing had decreased 96.5 to 89.2%  [name] presents for post-hospital follow up  She has been doing great. She has PT and a nurse coming by. She feels she is getting stronger. Her weight today was 168. Yesterday it was 164. She does not know why her weight would be up. She drinks 1 soda a day. She drinks 3 glasses of tea/day plus 4 glasses of water per day.   Her breathing is at baseline. She denies coughing or wheezing. She has no LE edema, no orthopnea or PND. She is watching the sodium in her food, does not add extra. She is still smoking, trying to cut back and quit. She uses a rollator for ambulation. She is able to do housework and anything else she wants to do. She is able to go walking. She has an alert necklace.   Past Medical History:  Diagnosis Date  . Arthritis   . Automatic implantable cardioverter-defibrillator in situ   . Bronchitis   . CAD (coronary artery disease)    a. s/p DES to LCx/RCA 05/2016, ostial LAD disease.  . Cardiomyopathy ([place])   . Chronic back pain   . Chronic combined systolic and diastolic CHF (congestive heart failure) ([place])   . Chronic constipation   . Chronic pain   . CKD (chronic kidney  disease), stage III   . Colon polyps 2003.  2015.   HP polyps 2003.  adnomas 2015.  required referal to [facility] for colonoscopic resection of flat polyps.   Marland Kitchen COPD (chronic obstructive pulmonary disease) ([place])   . Dementia   . Depression with anxiety    takes Cymbalta daily  . Diabetes mellitus ([place])   . Early cataracts, bilateral   . Fibromyalgia   . GERD (gastroesophageal reflux disease)    was on meds but was taken off;now watches what she eats  . Hemorrhoids   . History of kidney stones   . Hx of colonic polyps   . Hyperlipemia    takes Crestor daily  . Hypertension    takes Amlodipine and Metoprolol daily  . Insomnia   . LBBB (left bundle branch block)    Stress test 09/03/2010, EF 55  . Myocardial infarction   . PAD (peripheral artery disease) ([facility])    Carotid, subclavian, and lower extremity beds, currently not symptomatic  . Presence of combination internal cardiac defibrillator (ICD) and pacemaker   . Presence of permanent cardiac pacemaker   . Pulmonary hypertension   . S/P angioplasty with stent, lt. subclavian 07/31/11 08/01/2011  . Subclavian arterial stenosis, lt, with PTA/STENT 07/31/11 08/01/2011  . Syncope 07/28/2011   EF - 50-55, moderate concentric hypertrophy in left ventricle  . Urinary  incontinence   . Vertigo    takes Meclizine prn    Past Surgical History:  Procedure Laterality Date  . ABDOMINAL AORTAGRAM N/A 08/12/2013   Procedure: ABDOMINAL Maxcine Ham;  Surgeon: Elam Dutch, MD;  Location[facility] C[facility] LAB;  Service: Cardiovascular;  Laterality: N/A;  . ABDOMINAL HYSTERECTOMY    . APPENDECTOMY    . BACK SURGERY  2012  . BIV ICD GENERTAOR CHANGE OUT Left 02/20/2012   Procedure: BIV ICD GENERTAOR CHANGE OUT;  Surgeon: [name], MD;  Location: [facility] CATH LAB;  Service: Cardiovascular;  Laterality: Left;  . CARDIAC CATHETERIZATION  12/01/2007   By Dr. Melvern Banker, left heart cath,   . CARDIAC CATHETERIZATION N/A 05/20/2016   Procedure: Right/Left Heart Cath  and Coronary Angiography;  Surgeon: Sherren Mocha, MD;  Location: [place] CV LAB;  Service: Cardiovascular;  Laterality: N/A;  . CARDIAC CATHETERIZATION N/A 05/20/2016   Procedure: Coronary Stent Intervention;  Surgeon: Sherren Mocha, MD;  Location: [place] CV LAB;  Service: Cardiovascular;  Laterality: N/A;  . CARDIAC DEFIBRILLATOR PLACEMENT  05/2008   By Dr Blanch Media, Medtronic CANNOT HAVE MRI's  . CAROTID ANGIOGRAM N/A 07/31/2011   Procedure: CAROTID ANGIOGRAM;  Surgeon: Lorretta Harp, MD;  Location: [facility] CATH LAB;  Service: Cardiovascular;  Laterality: N/A;  carotid angiogram and possible Lt SCA PTA  . COLONOSCOPY W/ POLYPECTOMY  12/2013  . CORONARY ANGIOPLASTY    . ENDARTERECTOMY Left 11/08/2014   Procedure: LEFT CAROTID ENDARTERECTOMY WITH HEMASHIELD PATCH ANGIOPLASTY;  Surgeon: Elam Dutch, MD;  Location: [place];  Service: Vascular;  Laterality: Left;  . ESOPHAGOGASTRODUODENOSCOPY N/A 01/20/2014   Procedure: ESOPHAGOGASTRODUODENOSCOPY (EGD);  Surgeon: Jerene Bears, MD;  Location[facility] ENDOSCOPY;  Service: Endoscopy;  Laterality: N/A;  . FEMORAL-POPLITEAL BYPASS GRAFT Right 10/12/2013   Procedure:   Femoral-Peroneal trunk  bypass with nonreversed greater saphenous vein graft;  Surgeon: Elam Dutch, MD;  Location: [place];  Service: Vascular;  Laterality: Right;  . GIVENS CAPSULE STUDY N/A 01/20/2014   Procedure: GIVENS CAPSULE STUDY;  Surgeon: Jerene Bears, MD;  Location: [place];  Service: Gastroenterology;  Laterality: N/A;  . INTRAOPERATIVE ARTERIOGRAM Right 10/12/2013   Procedure: INTRA OPERATIVE ARTERIOGRAM;  Surgeon: Elam Dutch, MD;  Location: [place];  Service: Vascular;  Laterality: Right;  . ORIF ELBOW FRACTURE  08/16/2011   Procedure: OPEN REDUCTION INTERNAL FIXATION (ORIF) ELBOW/OLECRANON FRACTURE;  Surgeon: Schuyler Amor, MD;  Location: [place];  Service: Orthopedics;  Laterality: Left;  . RENAL ANGIOGRAM N/A 08/12/2013   Procedure: RENAL ANGIOGRAM;  Surgeon:  Elam Dutch, MD;  Location[facility] CATH LAB;  Service: Cardiovascular;  Laterality: N/A;  . SUBCLAVIAN STENT PLACEMENT Left 07/31/2011   7x18 Genesis, balloon, with reduction of 90% ostial left subclavian artery stenosis to 0% with residual excellent flow  . TONSILLECTOMY    . TUBAL LIGATION      Current Outpatient Prescriptions  Medication Sig Dispense Refill  . DULoxetine (CYMBALTA) 60 MG capsule Take 60 mg by mouth 2 (two) times daily.    . nitroGLYCERIN (NITROSTAT) 0.4 MG SL tablet Place 0.4 mg under the tongue every 5 (five) minutes as needed for chest pain. X 3 doses    . albuterol (PROVENTIL HFA;VENTOLIN HFA) 108 (90 Base) MCG/ACT inhaler Inhale 2 puffs into the lungs every 4 (four) hours as needed for wheezing or shortness of breath. 1 Inhaler 5  . allopurinol (ZYLOPRIM) 300 MG tablet Take 1 tablet (300 mg total) by mouth daily. 30 tablet  11  . ALPRAZolam (XANAX) 0.5 MG tablet Take 2 tablets (1 mg total) by mouth at bedtime as needed for anxiety. 60 tablet 5  . aspirin 81 MG chewable tablet Chew 1 tablet (81 mg total) by mouth daily. 30 tablet 1  . atorvastatin (LIPITOR) 40 MG tablet Take 1 tablet (40 mg total) by mouth daily at 6 PM. 90 tablet 3  . bisoprolol (ZEBETA) 5 MG tablet Take 1 tablet (5 mg total) by mouth daily. 90 tablet 3  . clopidogrel (PLAVIX) 75 MG tablet Take 75 mg by mouth daily.    . fluticasone (FLONASE) 50 MCG/ACT nasal spray Place 1 spray into both nostrils daily as needed for allergies.     . furosemide (LASIX) 80 MG tablet Take 1 tablet (80 mg total) by mouth daily. 30 tablet 3  . HYDROcodone-acetaminophen (NORCO) 10-325 MG tablet Take one tablet by mouth every 6 hours as needed for pain. DNE 3gm of APAP from all sources/24hours 120 tablet 0  . lisinopril (PRINIVIL,ZESTRIL) 5 MG tablet Take 1 tablet (5 mg total) by mouth daily. 30 tablet 0  . potassium chloride SA (K-DUR,KLOR-CON) 20 MEQ tablet Take 1 tablet (20 mEq total) by mouth daily. 90 tablet 3   No  current facility-administered medications for this visit.     Allergies:   Potassium-containing compounds; Azithromycin; Codeine; Darvon; Erythromycin; Meloxicam; Norco [hydrocodone-acetaminophen]; Penicillins; Propoxyphene n-acetaminophen; Rofecoxib; Rosuvastatin; Statins; and Sulfa antibiotics    Social History:  The patient  reports that she has been smoking Cigarettes.  She has a 62.00 pack-year smoking history. She has never used smokeless tobacco. She reports that she does not drink alcohol or use drugs.   Family History:  The patient's family history includes CAD in her father; Cancer in her sister; Colon cancer in her maternal grandmother; Deep vein thrombosis in her son; Diabetes in her sister; Heart disease in her father, mother, and sister; Hyperlipidemia in her father.    ROS:  Please see the history of present illness. All other systems are reviewed and negative.    PHYSICAL EXAM: VS:  BP 122/74   Pulse 96   Ht 5\' 5"  (1.651 m)   Wt 168 lb 6.4 oz (76.4 kg)   BMI 28.02 kg/m  , BMI Body mass index is 28.02 kg/m. GEN: Well nourished, well developed, female in no acute distress  HEENT: normal for age  Neck: no JVD, no carotid bruit, no masses Cardiac: RRR; no murmur, no rubs, or gallops Respiratory:  clear to auscultation bilaterally, normal work of breathing GI: soft, nontender, nondistended, + BS MS: no deformity or atrophy; no edema; distal pulses are 2+ in all 4 extremities   Skin: warm and dry, no rash Neuro:  Strength and sensation are intact Psych: euthymic mood, full affect   EKG:  EKG is ordered today. ECG is SR, V pacing, occ PVCs  Recent Labs: 05/14/2016: TSH 1.013 06/26/2016: ALT 11; B Natriuretic Peptide 963.2; Hemoglobin 14.4; Platelets 431 06/29/2016: Magnesium 1.8 07/09/2016: BUN 19; Creatinine, Ser 1.19; Potassium 4.4; Sodium 140    Lipid Panel    Component Value Date/Time   CHOL 193 05/15/2016 0248   TRIG 116 05/15/2016 0248   HDL 25 (L)  05/15/2016 0248   CHOLHDL 7.7 05/15/2016 0248   VLDL 23 05/15/2016 0248   LDLCALC 145 (H) 05/15/2016 0248   LDLDIRECT 152.0 05/30/2015 1452     Wt Readings from Last 3 Encounters:  07/11/16 168 lb 6.4 oz (76.4 kg)  07/09/16 167 lb (  75.8 kg)  06/30/16 170 lb 10.2 oz (77.4 kg)     Other studies Reviewed: Additional studies/ records that were reviewed today include: office notes, hospital records and testing.  ASSESSMENT AND PLAN:  1.  Chronic combined systolic and diastolic CHF: Her weight is pretty stable on her home scales and she feels her breathing is at baseline. Continue current rx, daily weights. She is encouraged to limit fluids to 2 quarts/day but not decrease the water she drinks. Ok to get a glass of ice when she is thirsty.  2. Tobacco use: she is trying to quit. Her son lives with her and will not quit. He may be moving out soon.  3. CKD III: Her GFR was 47 on recent lab work. Her Cr was up just a little, but BUN was down, acceptable in the setting of good volume status.   Current medicines are reviewed at length with the patient today.  The patient does not have concerns regarding medicines.  The following changes have been made:  no change  Labs/ tests ordered today include:   Orders Placed This Encounter  Procedures  . EKG 12-Lead     Disposition:   FU with Dr Sallyanne Kuster in 3 months if stays stable. BMET then  Augusto Garbe  07/11/2016 4:44 PM    [facility] Phone: [phone]; Fax: [phone]  This note was written with the assistance of speech recognition software. Please excuse any transcriptional errors.

## 2016-07-11 NOTE — Progress Notes (Signed)
Thanks! Sydney Phillips 

## 2016-07-12 DIAGNOSIS — J449 Chronic obstructive pulmonary disease, unspecified: Secondary | ICD-10-CM | POA: Diagnosis not present

## 2016-07-15 ENCOUNTER — Telehealth: Payer: Self-pay | Admitting: Physician Assistant

## 2016-07-15 NOTE — Telephone Encounter (Signed)
Pt said Sydney Phillips told her if her blood pressure went up 3 to 4 points in 2 days to notify the office. Today it was 167

## 2016-07-15 NOTE — Telephone Encounter (Signed)
Spoke with pt, she called to report weight was up 2 lbs in 2 days. She has lost a lb from yesterday to today. She denies SOB or edema.she will call back tomorrow if her weight continues to trend up.

## 2016-07-17 ENCOUNTER — Ambulatory Visit (INDEPENDENT_AMBULATORY_CARE_PROVIDER_SITE_OTHER): Payer: Medicare Other

## 2016-07-17 ENCOUNTER — Telehealth: Payer: Self-pay

## 2016-07-17 DIAGNOSIS — Z95810 Presence of automatic (implantable) cardiac defibrillator: Secondary | ICD-10-CM

## 2016-07-17 DIAGNOSIS — I5042 Chronic combined systolic (congestive) and diastolic (congestive) heart failure: Secondary | ICD-10-CM

## 2016-07-17 NOTE — Telephone Encounter (Signed)
Remote ICM transmission received.  Attempted patient call and left message to return call.   

## 2016-07-17 NOTE — Progress Notes (Signed)
EPIC Encounter for ICM Monitoring  Patient Name: Sydney Phillips is a 81 y.o. female Date: 07/17/2016 Primary Care Physican: Alysia Penna, MD Primary Cardiologist:Croitoru/ HF Rosaria Ferries, PA Electrophysiologist: Croitoru Dry Weight:unknown Bi-V Pacing:  95.7%       Attempted call to patient x 2 and unable to reach.  Left message to return call.  Transmission reviewed.  Patient contacted [facility] office on 07/15/16 to report 1-2 lb weight increase and weight was 167 lbs.  Baseline weight is 163 lbs - 165 lbs.  Thoracic impedance abnormal suggesting fluid accumulation.    Labs: 06/30/2016 Creatinine 1.15, BUN 27, Potassium 4.6, Sodium 139, EGFR 44-51 01/14/2018Creatinine 1.12, BUN 26, Potassium 4.3, Sodium 140, EGFR 46-53  01/13/2018Creatinine 1.01, BUN 26, Potassium 3.7, Sodium 141, EGFR 52->60  06/27/2016 Creatinine 1.06, BUN 14, Potassium 3.7, Sodium 140, EGFR 49-57  01/11/2018Creatinine 0.86, BUN 6, Potassium 3.4, Sodium 142, EGFR >60  01/05/2018Creatinine 0.83, BUN 7, Potassium 3.8, Sodium 144  05/31/2016 Creatinine 1.16, BUN 19, Potassium 3.8, Sodium 143, EGFR 44-51  05/30/2016 Creatinine 1.48, BUN 26, Potassium 3.3, Sodium 137, EGFR 33-38  05/29/2016 Creatinine 1.48, BUN 27, Potassium 3.6, Sodium 140  05/23/2016 Creatinine 1.21, BUN 27, Potassium 3.4, Sodium 140, EGFR 42-48  12/07/2017Creatinine 1.27, BUN 24, Potassium 3.6, Sodium 141, EGFR 39-46 05/21/2016 Creatinine 1.42, BUN 32, Potassium 4.0, Sodium 143, EGFR 34-40  05/20/2016 Creatinine 1.27, EGFR 39-46  05/20/2016 Creatinine 1.45, BUN 43, Potassium 4.1, Sodium 139, EGFR 34-39  05/19/2016 Creatinine 1.46, BUN 49, Potassium 3.9, Sodium 140, EGFR 33-39   Recommendations: NONE - Unable to reach patient - Patient is difficult to reach  Follow-up plan: ICM clinic phone appointment on 07/24/2016 to recheck fluid levels.  Copy of ICM check sent to primary cardiologist and device physician.   3 month ICM trend:  07/17/2016   1 Year ICM trend:      [name], RN 07/17/2016 2:02 PM

## 2016-07-18 DIAGNOSIS — I255 Ischemic cardiomyopathy: Secondary | ICD-10-CM | POA: Diagnosis not present

## 2016-07-18 DIAGNOSIS — Z794 Long term (current) use of insulin: Secondary | ICD-10-CM | POA: Diagnosis not present

## 2016-07-18 DIAGNOSIS — I252 Old myocardial infarction: Secondary | ICD-10-CM | POA: Diagnosis not present

## 2016-07-18 DIAGNOSIS — G894 Chronic pain syndrome: Secondary | ICD-10-CM | POA: Diagnosis not present

## 2016-07-18 DIAGNOSIS — E1122 Type 2 diabetes mellitus with diabetic chronic kidney disease: Secondary | ICD-10-CM | POA: Diagnosis not present

## 2016-07-18 DIAGNOSIS — Z955 Presence of coronary angioplasty implant and graft: Secondary | ICD-10-CM | POA: Diagnosis not present

## 2016-07-18 DIAGNOSIS — N183 Chronic kidney disease, stage 3 (moderate): Secondary | ICD-10-CM | POA: Diagnosis not present

## 2016-07-18 DIAGNOSIS — I2510 Atherosclerotic heart disease of native coronary artery without angina pectoris: Secondary | ICD-10-CM | POA: Diagnosis not present

## 2016-07-18 DIAGNOSIS — I5040 Unspecified combined systolic (congestive) and diastolic (congestive) heart failure: Secondary | ICD-10-CM | POA: Diagnosis not present

## 2016-07-18 DIAGNOSIS — I130 Hypertensive heart and chronic kidney disease with heart failure and stage 1 through stage 4 chronic kidney disease, or unspecified chronic kidney disease: Secondary | ICD-10-CM | POA: Diagnosis not present

## 2016-07-18 DIAGNOSIS — Z95810 Presence of automatic (implantable) cardiac defibrillator: Secondary | ICD-10-CM | POA: Diagnosis not present

## 2016-07-18 DIAGNOSIS — Z7982 Long term (current) use of aspirin: Secondary | ICD-10-CM | POA: Diagnosis not present

## 2016-07-18 NOTE — Telephone Encounter (Signed)
Attempted ICM call and no answer.  

## 2016-07-21 DIAGNOSIS — Z794 Long term (current) use of insulin: Secondary | ICD-10-CM | POA: Diagnosis not present

## 2016-07-21 DIAGNOSIS — N183 Chronic kidney disease, stage 3 (moderate): Secondary | ICD-10-CM | POA: Diagnosis not present

## 2016-07-21 DIAGNOSIS — I255 Ischemic cardiomyopathy: Secondary | ICD-10-CM | POA: Diagnosis not present

## 2016-07-21 DIAGNOSIS — Z7982 Long term (current) use of aspirin: Secondary | ICD-10-CM | POA: Diagnosis not present

## 2016-07-21 DIAGNOSIS — I130 Hypertensive heart and chronic kidney disease with heart failure and stage 1 through stage 4 chronic kidney disease, or unspecified chronic kidney disease: Secondary | ICD-10-CM | POA: Diagnosis not present

## 2016-07-21 DIAGNOSIS — E1122 Type 2 diabetes mellitus with diabetic chronic kidney disease: Secondary | ICD-10-CM | POA: Diagnosis not present

## 2016-07-21 DIAGNOSIS — G894 Chronic pain syndrome: Secondary | ICD-10-CM | POA: Diagnosis not present

## 2016-07-21 DIAGNOSIS — Z955 Presence of coronary angioplasty implant and graft: Secondary | ICD-10-CM | POA: Diagnosis not present

## 2016-07-21 DIAGNOSIS — I5040 Unspecified combined systolic (congestive) and diastolic (congestive) heart failure: Secondary | ICD-10-CM | POA: Diagnosis not present

## 2016-07-21 DIAGNOSIS — Z95810 Presence of automatic (implantable) cardiac defibrillator: Secondary | ICD-10-CM | POA: Diagnosis not present

## 2016-07-21 DIAGNOSIS — I252 Old myocardial infarction: Secondary | ICD-10-CM | POA: Diagnosis not present

## 2016-07-21 DIAGNOSIS — I2510 Atherosclerotic heart disease of native coronary artery without angina pectoris: Secondary | ICD-10-CM | POA: Diagnosis not present

## 2016-07-21 NOTE — Progress Notes (Signed)
msg left for patient to call. 

## 2016-07-22 ENCOUNTER — Encounter: Payer: Self-pay | Admitting: Cardiology

## 2016-07-22 ENCOUNTER — Telehealth: Payer: Self-pay | Admitting: Cardiovascular Disease

## 2016-07-22 DIAGNOSIS — Z794 Long term (current) use of insulin: Secondary | ICD-10-CM | POA: Diagnosis not present

## 2016-07-22 DIAGNOSIS — N183 Chronic kidney disease, stage 3 (moderate): Secondary | ICD-10-CM | POA: Diagnosis not present

## 2016-07-22 DIAGNOSIS — I5040 Unspecified combined systolic (congestive) and diastolic (congestive) heart failure: Secondary | ICD-10-CM | POA: Diagnosis not present

## 2016-07-22 DIAGNOSIS — I255 Ischemic cardiomyopathy: Secondary | ICD-10-CM | POA: Diagnosis not present

## 2016-07-22 DIAGNOSIS — E1122 Type 2 diabetes mellitus with diabetic chronic kidney disease: Secondary | ICD-10-CM | POA: Diagnosis not present

## 2016-07-22 DIAGNOSIS — G894 Chronic pain syndrome: Secondary | ICD-10-CM | POA: Diagnosis not present

## 2016-07-22 DIAGNOSIS — I2510 Atherosclerotic heart disease of native coronary artery without angina pectoris: Secondary | ICD-10-CM | POA: Diagnosis not present

## 2016-07-22 DIAGNOSIS — I252 Old myocardial infarction: Secondary | ICD-10-CM | POA: Diagnosis not present

## 2016-07-22 DIAGNOSIS — Z7982 Long term (current) use of aspirin: Secondary | ICD-10-CM | POA: Diagnosis not present

## 2016-07-22 DIAGNOSIS — Z955 Presence of coronary angioplasty implant and graft: Secondary | ICD-10-CM | POA: Diagnosis not present

## 2016-07-22 DIAGNOSIS — I130 Hypertensive heart and chronic kidney disease with heart failure and stage 1 through stage 4 chronic kidney disease, or unspecified chronic kidney disease: Secondary | ICD-10-CM | POA: Diagnosis not present

## 2016-07-22 DIAGNOSIS — Z95810 Presence of automatic (implantable) cardiac defibrillator: Secondary | ICD-10-CM | POA: Diagnosis not present

## 2016-07-22 NOTE — Progress Notes (Signed)
Left msg to notify patient of test (OK to leave detailed msg), to call back and speak to me if pt has concerns.

## 2016-07-22 NOTE — Telephone Encounter (Signed)
Spoke w Sydney Phillips at [facility]. Reports pt had observed 3 lb gain since last Thursday (sees patient twice a week on Mondays and Fridays). Pt having no acute symptoms, I.e. Denies SOB, neg for pedal edema on assessment, no new fatigue or other symptoms. However, pt did want to be seen in office. Informed her I could arrange appt w PA - pt preferred to see Dr. Sallyanne Kuster. Will see Friday at 3:45pm on open spot. Advised no med changes, but to call sooner if new concerns. Megan plans to call us back Thursday w update regarding weights, any fluid issues, etc. Of note, Rhonda sent me a msg concerning recent optivol readings, suggested worsening CHF. Routed to provider for FYI any further suggestions.

## 2016-07-22 NOTE — Telephone Encounter (Signed)
New Message     Per Caregiver, the pts weight is going up a pound every day, pt takes 80 mg of lasix , no sob , no adema, they are concerned with the sudden weight gain, this started at 2/2 168, she is up to 171

## 2016-07-22 NOTE — Progress Notes (Signed)
Pt is scheduled to see Dr. Sallyanne Kuster on Friday, i've sent him a note regarding this; also patient's caregiver called in today to report findings.

## 2016-07-22 NOTE — Telephone Encounter (Signed)
Understood. Will see on Friday MCr

## 2016-07-23 DIAGNOSIS — Z7982 Long term (current) use of aspirin: Secondary | ICD-10-CM | POA: Diagnosis not present

## 2016-07-23 DIAGNOSIS — I255 Ischemic cardiomyopathy: Secondary | ICD-10-CM | POA: Diagnosis not present

## 2016-07-23 DIAGNOSIS — I130 Hypertensive heart and chronic kidney disease with heart failure and stage 1 through stage 4 chronic kidney disease, or unspecified chronic kidney disease: Secondary | ICD-10-CM | POA: Diagnosis not present

## 2016-07-23 DIAGNOSIS — Z955 Presence of coronary angioplasty implant and graft: Secondary | ICD-10-CM | POA: Diagnosis not present

## 2016-07-23 DIAGNOSIS — E1122 Type 2 diabetes mellitus with diabetic chronic kidney disease: Secondary | ICD-10-CM | POA: Diagnosis not present

## 2016-07-23 DIAGNOSIS — N183 Chronic kidney disease, stage 3 (moderate): Secondary | ICD-10-CM | POA: Diagnosis not present

## 2016-07-23 DIAGNOSIS — G894 Chronic pain syndrome: Secondary | ICD-10-CM | POA: Diagnosis not present

## 2016-07-23 DIAGNOSIS — I5040 Unspecified combined systolic (congestive) and diastolic (congestive) heart failure: Secondary | ICD-10-CM | POA: Diagnosis not present

## 2016-07-23 DIAGNOSIS — Z794 Long term (current) use of insulin: Secondary | ICD-10-CM | POA: Diagnosis not present

## 2016-07-23 DIAGNOSIS — Z95810 Presence of automatic (implantable) cardiac defibrillator: Secondary | ICD-10-CM | POA: Diagnosis not present

## 2016-07-23 DIAGNOSIS — I2510 Atherosclerotic heart disease of native coronary artery without angina pectoris: Secondary | ICD-10-CM | POA: Diagnosis not present

## 2016-07-23 DIAGNOSIS — I252 Old myocardial infarction: Secondary | ICD-10-CM | POA: Diagnosis not present

## 2016-07-24 ENCOUNTER — Telehealth: Payer: Self-pay | Admitting: Cardiology

## 2016-07-24 ENCOUNTER — Ambulatory Visit (INDEPENDENT_AMBULATORY_CARE_PROVIDER_SITE_OTHER): Payer: Medicare Other

## 2016-07-24 ENCOUNTER — Ambulatory Visit: Payer: Medicare Other | Admitting: Neurology

## 2016-07-24 ENCOUNTER — Telehealth: Payer: Self-pay

## 2016-07-24 DIAGNOSIS — Z95810 Presence of automatic (implantable) cardiac defibrillator: Secondary | ICD-10-CM | POA: Diagnosis not present

## 2016-07-24 DIAGNOSIS — I5042 Chronic combined systolic (congestive) and diastolic (congestive) heart failure: Secondary | ICD-10-CM

## 2016-07-24 NOTE — Progress Notes (Signed)
EPIC Encounter for ICM Monitoring  Patient Name: Sydney Phillips is a 81 y.o. female Date: 07/24/2016 Primary Care Physican: Alysia Penna, MD Primary Cardiologist:Croitoru/ Rosaria Ferries, PA Electrophysiologist: Croitoru Baseline Weight: 163-165 lbs Today's Weight:unknown Bi-V Pacing:  96.8%       Attempted call to patient and unable to reach.  Left message to return call.  Transmission reviewed.   Thoracic impedance continues to be abnormal suggesting fluid accumulation.  Labs: 06/30/2016 Creatinine 1.15, BUN 27, Potassium 4.6, Sodium 139, EGFR 44-51 01/14/2018Creatinine 1.12, BUN 26, Potassium 4.3, Sodium 140, EGFR 46-53  01/13/2018Creatinine 1.01, BUN 26, Potassium 3.7, Sodium 141, EGFR 52->60  06/27/2016 Creatinine 1.06, BUN 14, Potassium 3.7, Sodium 140, EGFR 49-57  01/11/2018Creatinine 0.86, BUN 6, Potassium 3.4, Sodium 142, EGFR >60  01/05/2018Creatinine 0.83, BUN 7, Potassium 3.8, Sodium 144  05/31/2016 Creatinine 1.16, BUN 19, Potassium 3.8, Sodium 143, EGFR 44-51  05/30/2016 Creatinine 1.48, BUN 26, Potassium 3.3, Sodium 137, EGFR 33-38  05/29/2016 Creatinine 1.48, BUN 27, Potassium 3.6, Sodium 140  05/23/2016 Creatinine 1.21, BUN 27, Potassium 3.4, Sodium 140, EGFR 42-48  12/07/2017Creatinine 1.27, BUN 24, Potassium 3.6, Sodium 141, EGFR 39-46 05/21/2016 Creatinine 1.42, BUN 32, Potassium 4.0, Sodium 143, EGFR 34-40  05/20/2016 Creatinine 1.27, EGFR 39-46  05/20/2016 Creatinine 1.45, BUN 43, Potassium 4.1, Sodium 139, EGFR 34-39  05/19/2016 Creatinine 1.46, BUN 49, Potassium 3.9, Sodium 140, EGFR 33-39   Recommendations:  NONE - Unable to reach patient   Follow-up plan: ICM clinic phone appointment on 07/31/2016 to recheck fluid levels.  Office appointment with Dr Sallyanne Kuster tomorrow, 07/25/2016 for CHF symptoms.  Copy of ICM check sent to Dr Sallyanne Kuster for review and patient already has office appointment tomorrow.   3 month ICM trend: 07/24/2016   1 Year ICM  trend:      [name], RN 07/24/2016 8:45 AM

## 2016-07-24 NOTE — Telephone Encounter (Signed)
Remote ICM transmission received.  Attempted patient call and left message to return.

## 2016-07-24 NOTE — Telephone Encounter (Signed)
The patient called with a 3 lb weight gain from this morning until this evening.  She has had lower impedance on her device recently.  She does have any acute SOB.  I suggested that she take an extra 40 mg of Lasix this evening and call in the morning with her AM weight .

## 2016-07-25 ENCOUNTER — Ambulatory Visit (INDEPENDENT_AMBULATORY_CARE_PROVIDER_SITE_OTHER): Payer: Medicare Other | Admitting: Cardiovascular Disease

## 2016-07-25 ENCOUNTER — Encounter: Payer: Self-pay | Admitting: Cardiovascular Disease

## 2016-07-25 VITALS — BP 121/70 | HR 87 | Ht 65.0 in | Wt 170.2 lb

## 2016-07-25 DIAGNOSIS — I5043 Acute on chronic combined systolic (congestive) and diastolic (congestive) heart failure: Secondary | ICD-10-CM

## 2016-07-25 DIAGNOSIS — Z95810 Presence of automatic (implantable) cardiac defibrillator: Secondary | ICD-10-CM

## 2016-07-25 DIAGNOSIS — I739 Peripheral vascular disease, unspecified: Secondary | ICD-10-CM

## 2016-07-25 DIAGNOSIS — I25118 Atherosclerotic heart disease of native coronary artery with other forms of angina pectoris: Secondary | ICD-10-CM

## 2016-07-25 DIAGNOSIS — E782 Mixed hyperlipidemia: Secondary | ICD-10-CM

## 2016-07-25 DIAGNOSIS — J438 Other emphysema: Secondary | ICD-10-CM

## 2016-07-25 MED ORDER — FUROSEMIDE 80 MG PO TABS: 80.0000 mg | ORAL_TABLET | ORAL | 11 refills | Status: DC

## 2016-07-25 NOTE — Progress Notes (Signed)
Patient ID: Sydney Phillips, female   DOB: 11-Apr-1936, 81 y.o.   MRN: [id number]    Cardiology Office Note    Date:  07/27/2016   ID:  Sydney Phillips, DOB 1935-10-04, MRN [id number]  PCP:  Alysia Penna, MD  Cardiologist:   [name], MD   Chief Complaint  Patient presents with  . Follow-up    pt c/o weight gain 3.5 in one day    History of Present Illness:  Sydney Phillips is a 81 y.o. female with cardiomyopathy here for CHF follow up, check on CRT-D device, Recent hospitalizations in November 2017, December 2017 and January 2018.   She has mixed ischemic and nonischemic CMP and has been CRT "hyper-responder". In 2009 her ejection fraction was 20%. After CRT, her LV improved and in 2013 EF was 50% and she had several years of relative cardiac stability. Over the last year she has had numerous severe clinical events.  LVEF in September 2017 was 40-45%, thendropped to 25-30% in November 2017 when she had NSTEMI, in the setting of COPD exacerbation, respiratory failure, acute renal failure and syncope. Cardiac catheterization showed severe stenoses in the right coronary artery and left circumflex coronary artery both treated with drug-eluting stents. There was also moderate ostial stenosis of the LAD. Enrolled in [place] study. Repeat echo in January 2018 showed EF 20-25% with a severely dilated left ventricle, moderate mitral regurgitation, systolic PA pressure 53 mmHg. Was readmitted on December 15 with altered mental status, suspicion of steroid induced psychosis and discharged to skilled nursing facility, but again seen in the emergency room on December 19 after a fall with head injury. Gradually improved mental status and felt to be back at baseline by early January. After that hospitalization for diuretics were held for acute renal insufficiency and she was admitted on January 11 with acute exacerbation of heart failure. Improved with diuretics and discharged on January 15 with a  weight of 77.4 kg (171 lb). Enrolled in the remote CHF monitoring clinic with [facility], her thoracic impedance has shown extreme volatility, matching her alternating episodes of hypo- and hypervolemia.  She is feeling better. She denies significant dyspnea, back to her usual NYHA functional class II status. She does not have leg edema. Her weight has stabilized at around 168 pounds on her home scale. Her home scale yesterday evening showed 173 pounds and she took an extra 40 mg of furosemide. This morning in our office she weighs 170 pounds.  CRT-D check shows normal findings as far as the battery/leads are concerned and excellent (greater than 99%) bi-V pacing. Her Optivol has remained extremely volatile. She has shown evidence of hypervolemia without interruption since early November. There was a trend of rapid improvement in mid January, but there is now again evidence of decline in the thoracic impedance suggesting impending heart failure. This latest downward trend began around January 20.  She denies angina pectoris, leg edema, worsening shortness of breath, palpitations or syncope. Thankfully she has not had any recent serious falls  She has a history of cardiomyopathy that was felt to be of mixed etiology. Her ejection fraction in 2009 was 20%. Catheterization revealed a 75% LAD and a 75% right coronary artery. She had a biventricular ICD placed at that time. Echocardiogram in February of 2013 showed an ejection fraction of 50-55%, which remained stable until September 2017 (45-50%). EF dropped to 25% in November 2017, received DES to LCX and RCA, note moderate ostial LD stenosis. In January  2018, EF had not improved (20-25%).  Patient also has peripheral vascular disease (Dr. Oneida Alar). 2013 left subclavian stent. May 2016 left carotid endarterectomy. Lower extremities R ABI 1.0, L ABI 0.8. (Oct 2017). Has COPD (ongoing smoking), HTN and HLP, but no DM.  Past Medical History:  Diagnosis Date    . Arthritis   . Automatic implantable cardioverter-defibrillator in situ   . Bronchitis   . CAD (coronary artery disease)    a. s/p DES to LCx/RCA 05/2016, ostial LAD disease.  . Cardiomyopathy ([place])   . Chronic back pain   . Chronic combined systolic and diastolic CHF (congestive heart failure) ([place])   . Chronic constipation   . Chronic pain   . CKD (chronic kidney disease), stage III   . Colon polyps 2003.  2015.   HP polyps 2003.  adnomas 2015.  required referal to [facility] for colonoscopic resection of flat polyps.   Marland Kitchen COPD (chronic obstructive pulmonary disease) ([place])   . Dementia   . Depression with anxiety    takes Cymbalta daily  . Diabetes mellitus ([place])   . Early cataracts, bilateral   . Fibromyalgia   . GERD (gastroesophageal reflux disease)    was on meds but was taken off;now watches what she eats  . Hemorrhoids   . History of kidney stones   . Hx of colonic polyps   . Hyperlipemia    takes Crestor daily  . Hypertension    takes Amlodipine and Metoprolol daily  . Insomnia   . LBBB (left bundle branch block)    Stress test 09/03/2010, EF 55  . Myocardial infarction   . PAD (peripheral artery disease) (H[facility])    Carotid, subclavian, and lower extremity beds, currently not symptomatic  . Presence of combination internal cardiac defibrillator (ICD) and pacemaker   . Presence of permanent cardiac pacemaker   . Pulmonary hypertension   . S/P angioplasty with stent, lt. subclavian 07/31/11 08/01/2011  . Subclavian arterial stenosis, lt, with PTA/STENT 07/31/11 08/01/2011  . Syncope 07/28/2011   EF - 50-55, moderate concentric hypertrophy in left ventricle  . Urinary incontinence   . Vertigo    takes Meclizine prn    Past Surgical History:  Procedure Laterality Date  . ABDOMINAL AORTAGRAM N/A 08/12/2013   Procedure: ABDOMINAL Maxcine Ham;  Surgeon: Elam Dutch, MD;  Location: [facility] CATH LAB;  Service: Cardiovascular;  Laterality: N/A;  . ABDOMINAL HYSTERECTOMY    .  APPENDECTOMY    . BACK SURGERY  2012  . BIV ICD GENERTAOR CHANGE OUT Left 02/20/2012   Procedure: BIV ICD GENERTAOR CHANGE OUT;  Surgeon: [name], MD;  Location: [facility] CATH LAB;  Service: Cardiovascular;  Laterality: Left;  . CARDIAC CATHETERIZATION  12/01/2007   By Dr. Melvern Banker, left heart cath,   . CARDIAC CATHETERIZATION N/A 05/20/2016   Procedure: Right/Left Heart Cath and Coronary Angiography;  Surgeon: Sherren Mocha, MD;  Location: [place] CV LAB;  Service: Cardiovascular;  Laterality: N/A;  . CARDIAC CATHETERIZATION N/A 05/20/2016   Procedure: Coronary Stent Intervention;  Surgeon: Sherren Mocha, MD;  Location: [place] CV LAB;  Service: Cardiovascular;  Laterality: N/A;  . CARDIAC DEFIBRILLATOR PLACEMENT  05/2008   By Dr Blanch Media, [employer] CANNOT HAVE MRI's  . CAROTID ANGIOGRAM N/A 07/31/2011   Procedure: CAROTID ANGIOGRAM;  Surgeon: Lorretta Harp, MD;  Location: [facility] CATH LAB;  Service: Cardiovascular;  Laterality: N/A;  carotid angiogram and possible Lt SCA PTA  . COLONOSCOPY W/ POLYPECTOMY  12/2013  . CORONARY ANGIOPLASTY    .  ENDARTERECTOMY Left 11/08/2014   Procedure: LEFT CAROTID ENDARTERECTOMY WITH HEMASHIELD PATCH ANGIOPLASTY;  Surgeon: Elam Dutch, MD;  Location: [place];  Service: Vascular;  Laterality: Left;  . ESOPHAGOGASTRODUODENOSCOPY N/A 01/20/2014   Procedure: ESOPHAGOGASTRODUODENOSCOPY (EGD);  Surgeon: Jerene Bears, MD;  Location: [facility] ENDOSCOPY;  Service: Endoscopy;  Laterality: N/A;  . FEMORAL-POPLITEAL BYPASS GRAFT Right 10/12/2013   Procedure:   Femoral-Peroneal trunk  bypass with nonreversed greater saphenous vein graft;  Surgeon: Elam Dutch, MD;  Location: [place];  Service: Vascular;  Laterality: Right;  . GIVENS CAPSULE STUDY N/A 01/20/2014   Procedure: GIVENS CAPSULE STUDY;  Surgeon: Jerene Bears, MD;  Location: [place];  Service: Gastroenterology;  Laterality: N/A;  . INTRAOPERATIVE ARTERIOGRAM Right 10/12/2013   Procedure: INTRA OPERATIVE  ARTERIOGRAM;  Surgeon: Elam Dutch, MD;  Location: [place];  Service: Vascular;  Laterality: Right;  . ORIF ELBOW FRACTURE  08/16/2011   Procedure: OPEN REDUCTION INTERNAL FIXATION (ORIF) ELBOW/OLECRANON FRACTURE;  Surgeon: Schuyler Amor, MD;  Location: [place];  Service: Orthopedics;  Laterality: Left;  . RENAL ANGIOGRAM N/A 08/12/2013   Procedure: RENAL ANGIOGRAM;  Surgeon: Elam Dutch, MD;  Location: [facility] CATH LAB;  Service: Cardiovascular;  Laterality: N/A;  . SUBCLAVIAN STENT PLACEMENT Left 07/31/2011   7x18 Genesis, balloon, with reduction of 90% ostial left subclavian artery stenosis to 0% with residual excellent flow  . TONSILLECTOMY    . TUBAL LIGATION      Current Medications: Outpatient Medications Prior to Visit  Medication Sig Dispense Refill  . albuterol (PROVENTIL HFA;VENTOLIN HFA) 108 (90 Base) MCG/ACT inhaler Inhale 2 puffs into the lungs every 4 (four) hours as needed for wheezing or shortness of breath. 1 Inhaler 5  . allopurinol (ZYLOPRIM) 300 MG tablet Take 1 tablet (300 mg total) by mouth daily. 30 tablet 11  . ALPRAZolam (XANAX) 0.5 MG tablet Take 2 tablets (1 mg total) by mouth at bedtime as needed for anxiety. 60 tablet 5  . aspirin 81 MG chewable tablet Chew 1 tablet (81 mg total) by mouth daily. 30 tablet 1  . atorvastatin (LIPITOR) 40 MG tablet Take 1 tablet (40 mg total) by mouth daily at 6 PM. 90 tablet 3  . bisoprolol (ZEBETA) 5 MG tablet Take 1 tablet (5 mg total) by mouth daily. 90 tablet 3  . clopidogrel (PLAVIX) 75 MG tablet Take 75 mg by mouth daily.    . DULoxetine (CYMBALTA) 60 MG capsule Take 60 mg by mouth 2 (two) times daily.    . fluticasone (FLONASE) 50 MCG/ACT nasal spray Place 1 spray into both nostrils daily as needed for allergies.     Marland Kitchen HYDROcodone-acetaminophen (NORCO) 10-325 MG tablet Take one tablet by mouth every 6 hours as needed for pain. DNE 3gm of APAP from all sources/24hours 120 tablet 0  . lisinopril (PRINIVIL,ZESTRIL) 5 MG  tablet Take 1 tablet (5 mg total) by mouth daily. 30 tablet 0  . nitroGLYCERIN (NITROSTAT) 0.4 MG SL tablet Place 0.4 mg under the tongue every 5 (five) minutes as needed for chest pain. X 3 doses    . potassium chloride SA (K-DUR,KLOR-CON) 20 MEQ tablet Take 1 tablet (20 mEq total) by mouth daily. 90 tablet 3  . furosemide (LASIX) 80 MG tablet Take 1 tablet (80 mg total) by mouth daily. 30 tablet 3   No facility-administered medications prior to visit.      Allergies:   Potassium-containing compounds; Azithromycin; Codeine; Darvon; Erythromycin; Meloxicam; Norco [hydrocodone-acetaminophen]; Penicillins; Propoxyphene n-acetaminophen;  Rofecoxib; Rosuvastatin; Statins; and Sulfa antibiotics   Social History   Social History  . Marital status: Widowed    Spouse name: N/A  . Number of children: 3  . Years of education: N/A   Social History Main Topics  . Smoking status: Current Every Day Smoker    Packs/day: 1.00    Years: 62.00    Types: Cigarettes  . Smokeless tobacco: Never Used     Comment: Down to 3-4 cigarettes per day  . Alcohol use No  . Drug use: No  . Sexual activity: No   Other Topics Concern  . None   Social History Narrative   Ok to share information with medical POA, Son Gabriel Carina     Family History:  The patient's family history includes CAD in her father; Cancer in her sister; Colon cancer in her maternal grandmother; Deep vein thrombosis in her son; Diabetes in her sister; Heart disease in her father, mother, and sister; Hyperlipidemia in her father.   ROS:   Please see the history of present illness.    ROS All other systems reviewed and are negative.   PHYSICAL EXAM:   VS:  BP 121/70 (BP Location: Right Arm, Patient Position: Sitting, Cuff Size: Normal)   Pulse 87   Ht 5\' 5"  (1.651 m)   Wt 77.2 kg (170 lb 3.2 oz)   SpO2 95%   BMI 28.32 kg/m    GEN: Well nourished, well developed, in no acute distress  HEENT: normal  Neck: no JVD, carotid bruits,  or masses Cardiac: Paradoxically split  S2, RRR; no murmurs, rubs, or gallops,no edema  Respiratory:  clear to auscultation bilaterally, normal work of breathing GI: soft, nontender, nondistended, + BS MS: no deformity or atrophy  Skin: warm and dry, no rash Neuro:  Alert and Oriented x 3, Strength and sensation are intact Psych: euthymic mood, full affect  Wt Readings from Last 3 Encounters:  07/25/16 77.2 kg (170 lb 3.2 oz)  07/11/16 76.4 kg (168 lb 6.4 oz)  07/09/16 75.8 kg (167 lb)      Studies/Labs Reviewed:   EKG:  EKG is ordered today.  The ekg ordered today demonstrates Atrial sensed ventricular paced rhythm. Although there is no prominent R wave in lead V1, the QRS is remarkably narrow. Patient carries 112 ms.  Recent Labs: 05/14/2016: TSH 1.013 06/26/2016: ALT 11; B Natriuretic Peptide 963.2; Hemoglobin 14.4; Platelets 431 06/29/2016: Magnesium 1.8 07/09/2016: BUN 19; Creatinine, Ser 1.19; Potassium 4.4; Sodium 140   Lipid Panel    Component Value Date/Time   CHOL 193 05/15/2016 0248   TRIG 116 05/15/2016 0248   HDL 25 (L) 05/15/2016 0248   CHOLHDL 7.7 05/15/2016 0248   VLDL 23 05/15/2016 0248   LDLCALC 145 (H) 05/15/2016 0248   LDLDIRECT 152.0 05/30/2015 1452    Additional studies/ records that were reviewed today include:  Multiple recent hospitalization, echo images, device downloads, cardiac Images   ASSESSMENT:    1. Acute on chronic combined systolic and diastolic ACC/AHA stage C congestive heart failure ([place])   2. Coronary artery disease involving native coronary artery of native heart with other form of angina pectoris ([place])   3. PAD (peripheral artery disease) ([place])   4. Mixed hyperlipidemia   5. Biventricular implantable cardioverter-defibrillator in situ   6. Other emphysema ([place])      PLAN:  In order of problems listed above:  1. Acute on chronic CHF: She has a very narrow margin of compensation. Multiple  recent hospitalizations. Most recent  EF around 25%, has not improved despite revascularization in November. Her weight has recently increased and her thoracic impedance was again worsening, but she has shown good response to diuretics. Will increase her dose of diuretics on an every other day basis, but she will require frequent clinical and lab assessment to make sure were maintaining her in good volume range. She is on ACE inhibitor recent a highly selective beta blocker. No changes are made to these medications today. If she has enough blood pressure when she becomes euvolemic, can consider increasing the dose of ACE inhibitor. Ideally, would switch to [facility], but I'd like her to show a pattern of good compliance with follow up first. Try to keep at 168 lb or less on home scale. 2. CAD s/p DES to RCA and LCX Nov 2017: in [place] study. Could make a good argument for lifelong aggressive antiplatelet therapy based on her anatomy, but note that she hada lot of problems with GI bleeding a couple of years ago. 3. PAD: History of left subclavian stent, generally should monitor blood pressure in the right arm. History of left carotid endarterectomy, followed by Dr. Oneida Alar. Asymptomatic decrease in left lower extremity ABI. 4. HLP: Has unsuccessfully tried to take statins in the past. High risk of progression of disease unless intervene and bring her LDL down to target under 70. Again, if she proves to be compliant with follow-up will pursue PCS K9 inhibitors 5. CRT-D: normal device function, good CRT efficiency. Enrolled in ICM clinic, but it is often difficult to get a hold of her by phone. 6. COPD: Strongly encouraged to quit smoking. At one point she was making efforts to stop cigarettes. Need to try to push towards this goal again.    Medication Adjustments/Labs and Tests Ordered: Current medicines are reviewed at length with the patient today.  Concerns regarding medicines are outlined above.  Medication changes, Labs and Tests ordered  today are listed in the Patient Instructions below. Patient Instructions  Dr Sallyanne Kuster has recommended making the following medication changes: 1. CHANGE the way you take Furosemide: -On Mondays, Wednesdays, and Fridays: Take 1 tablet (80 mg total) once daily -On Tuesdays, Thursdays, Saturdays, and Sundays: Take 1.5 tablets (120 mg total) once daily  Your physician recommends that you schedule a follow-up appointment in 4 weeks with an extender (NP/PA).  Dr Sallyanne Kuster recommends that you schedule a follow-up appointment in 2 months.  If you need a refill on your cardiac medications before your next appointment, please call your pharmacy.     Signed, [name], MD  07/27/2016 12:13 PM    [facility] [place], [place], [place]  [address] Phone: [phone]; Fax: [phone]

## 2016-07-25 NOTE — Patient Instructions (Addendum)
Dr Sallyanne Kuster has recommended making the following medication changes: 1. CHANGE the way you take Furosemide: -On Mondays, Wednesdays, and Fridays: Take 1 tablet (80 mg total) once daily -On Tuesdays, Thursdays, Saturdays, and Sundays: Take 1.5 tablets (120 mg total) once daily  Your physician recommends that you schedule a follow-up appointment in 4 weeks with an extender (NP/PA).  Dr Sallyanne Kuster recommends that you schedule a follow-up appointment in 2 months.  If you need a refill on your cardiac medications before your next appointment, please call your pharmacy.

## 2016-07-28 ENCOUNTER — Telehealth: Payer: Self-pay | Admitting: Family Medicine

## 2016-07-28 ENCOUNTER — Telehealth: Payer: Self-pay | Admitting: *Deleted

## 2016-07-28 ENCOUNTER — Ambulatory Visit: Payer: Medicare Other | Admitting: Neurology

## 2016-07-28 NOTE — Telephone Encounter (Signed)
Sydney Phillips w/[facility] refused nursing visit on Friday 07/25/16 due to her going to heart failure clinic.

## 2016-07-28 NOTE — Telephone Encounter (Signed)
Patient called and canceled new patient appt the same morning it was scheduled. She was unable to come due to sickness.

## 2016-07-29 ENCOUNTER — Ambulatory Visit: Payer: Medicare Other | Admitting: Family Medicine

## 2016-07-30 ENCOUNTER — Ambulatory Visit (INDEPENDENT_AMBULATORY_CARE_PROVIDER_SITE_OTHER): Payer: Medicare Other | Admitting: Family Medicine

## 2016-07-30 VITALS — BP 136/81 | HR 81 | Temp 98.3°F | Ht 65.0 in | Wt 172.0 lb

## 2016-07-30 DIAGNOSIS — G4700 Insomnia, unspecified: Secondary | ICD-10-CM

## 2016-07-30 DIAGNOSIS — F411 Generalized anxiety disorder: Secondary | ICD-10-CM

## 2016-07-30 MED ORDER — ALPRAZOLAM 1 MG PO TABS: 1.0000 mg | ORAL_TABLET | Freq: Four times a day (QID) | ORAL | 5 refills | Status: DC | PRN

## 2016-07-30 MED ORDER — CLOPIDOGREL BISULFATE 75 MG PO TABS: 75.0000 mg | ORAL_TABLET | Freq: Every day | ORAL | 11 refills | Status: DC

## 2016-07-30 NOTE — Progress Notes (Signed)
Pre visit review using our clinic review tool, if applicable. No additional management support is needed unless otherwise documented below in the visit note. 

## 2016-07-31 ENCOUNTER — Ambulatory Visit (INDEPENDENT_AMBULATORY_CARE_PROVIDER_SITE_OTHER): Payer: Medicare Other

## 2016-07-31 ENCOUNTER — Telehealth: Payer: Self-pay

## 2016-07-31 ENCOUNTER — Telehealth: Payer: Self-pay | Admitting: Cardiology

## 2016-07-31 ENCOUNTER — Encounter: Payer: Self-pay | Admitting: Family Medicine

## 2016-07-31 DIAGNOSIS — Z95810 Presence of automatic (implantable) cardiac defibrillator: Secondary | ICD-10-CM

## 2016-07-31 DIAGNOSIS — I5042 Chronic combined systolic (congestive) and diastolic (congestive) heart failure: Secondary | ICD-10-CM

## 2016-07-31 NOTE — Telephone Encounter (Signed)
LMOVM reminding pt to send remote transmission.   

## 2016-07-31 NOTE — Progress Notes (Signed)
EPIC Encounter for ICM Monitoring  Patient Name: Sydney Phillips is a 81 y.o. female Date: 07/31/2016 Primary Care Physican: Alysia Penna, MD Primary Cardiologist:Croitoru/ Rosaria Ferries, PA Electrophysiologist: Croitoru Baseline Weight: 163-165 lbs Today's Weight:unknown Bi-V Pacing:  96.4%            Attempted call to patient and unable to reach.   Transmission reviewed.    Thoracic impedance continues to be abnormal suggesting fluid accumulation.  Patient seen by Dr Croitioru  Current prescribed dose of Furosemide 80 mg 1-1.5 tablets (80 mg - 120 mg total) every morning and Potassium 20 mEq   Labs: 07/09/2016 Creatinine 1.19, BUN 19, Potassium 4.4, Sodium 140 06/30/2016 Creatinine 1.15, BUN 27, Potassium 4.6, Sodium 139, EGFR 44-51 01/14/2018Creatinine 1.12, BUN 26, Potassium 4.3, Sodium 140, EGFR 46-53  01/13/2018Creatinine 1.01, BUN 26, Potassium 3.7, Sodium 141, EGFR 52->60  06/27/2016 Creatinine 1.06, BUN 14, Potassium 3.7, Sodium 140, EGFR 49-57  01/11/2018Creatinine 0.86, BUN 6, Potassium 3.4, Sodium 142, EGFR >60  01/05/2018Creatinine 0.83, BUN 7, Potassium 3.8, Sodium 144  05/31/2016 Creatinine 1.16, BUN 19, Potassium 3.8, Sodium 143, EGFR 44-51  05/30/2016 Creatinine 1.48, BUN 26, Potassium 3.3, Sodium 137, EGFR 33-38  05/29/2016 Creatinine 1.48, BUN 27, Potassium 3.6, Sodium 140  05/23/2016 Creatinine 1.21, BUN 27, Potassium 3.4, Sodium 140, EGFR 42-48  12/07/2017Creatinine 1.27, BUN 24, Potassium 3.6, Sodium 141, EGFR 39-46 05/21/2016 Creatinine 1.42, BUN 32, Potassium 4.0, Sodium 143, EGFR 34-40  05/20/2016 Creatinine 1.27, EGFR 39-46  05/20/2016 Creatinine 1.45, BUN 43, Potassium 4.1, Sodium 139, EGFR 34-39  05/19/2016 Creatinine 1.46, BUN 49, Potassium 3.9, Sodium 140, EGFR 33-39   Recommendations: NONE - Unable to reach patient   Follow-up plan: ICM clinic phone appointment on 08/18/2016 to recheck fluid levels.  Copy of ICM check sent to primary  cardiologist and device physician.   3 month ICM trend: 07/31/2016   1 Year ICM trend:      [name], RN 07/31/2016 2:47 PM

## 2016-07-31 NOTE — Progress Notes (Signed)
   Subjective:    Patient ID: Sydney Phillips, female    DOB: 04-23-1936, 81 y.o.   MRN: [id number]  HPI Here asking for help with her anxiety and with poor sleep. She had been taking Xanax 1 mg up to 4 times daily with good results until her recent hospitalization for steroid-induced psychosis with some renal failure. These issues were successful treated, and now her mental status is back to normal. However the Xanax was decreased quite a bit in the hospital. Now she cannot fall asleep or if she does she wakes up after a few hours. She feels nervous during the day as well. She has had no more delusions or hallucinations for the past several weeks.    Review of Systems  Constitutional: Negative.   Respiratory: Negative.   Cardiovascular: Negative.   Neurological: Negative.   Psychiatric/Behavioral: Positive for sleep disturbance. Negative for agitation, behavioral problems, confusion, decreased concentration, dysphoric mood and hallucinations. The patient is nervous/anxious.        Objective:   Physical Exam  Constitutional: She is oriented to person, place, and time. She appears well-developed and well-nourished.  Cardiovascular: Normal rate, regular rhythm, normal heart sounds and intact distal pulses.   Pulmonary/Chest: Effort normal and breath sounds normal.  Neurological: She is alert and oriented to person, place, and time.  Psychiatric: She has a normal mood and affect. Her behavior is normal. Judgment and thought content normal.          Assessment & Plan:  Her mental state seems intact but she is having trouble with her old anxiety and insomnia. We will go back to Xanax 1mg  to use up to 4 a day prn, and she will take this at bedtime.  Alysia Penna, MD

## 2016-07-31 NOTE — Telephone Encounter (Signed)
Remote ICM transmission received.  Attempted patient call and no answer 

## 2016-08-05 ENCOUNTER — Telehealth: Payer: Self-pay | Admitting: Cardiovascular Disease

## 2016-08-05 NOTE — Telephone Encounter (Signed)
Pt of Dr. Sallyanne Kuster  Med changes when last seen in office. (07/25/16) Her lasix dose was increased from 80mg  daily to 80mg  and 120mg  daily in alternating doses.  Pt reporting she was up all night last night Reports muscles tight this morning, "can't hardly walk". She denies worse LE swelling, however, or at least "can't tell if it's worse". Weights 172.6 yesterday, 175.0 today  no other symptoms. Voices she feels she is not passing as much fluid as she's taking in. Reports she was doing fine w/ most recent changes until this AM, avoiding salt, excess fluid intake, etc.  ?recheck BMET, concern for K+?  Pt aware I'll seek recommendations from provider & f/u, unsure how best to advise.

## 2016-08-05 NOTE — Telephone Encounter (Signed)
Patient is calling, states that the muscles in both legs are so sore and tight that she can hardly walk. The pain and tightness starts from the knees and goes down. Patient has not contacted her PCP, she states that Dr. Sallyanne Kuster changed her fluid pills. Patient is now "sitting in chair in kitchen and can't get up." Thanks.

## 2016-08-06 DIAGNOSIS — I130 Hypertensive heart and chronic kidney disease with heart failure and stage 1 through stage 4 chronic kidney disease, or unspecified chronic kidney disease: Secondary | ICD-10-CM | POA: Diagnosis not present

## 2016-08-06 DIAGNOSIS — Z794 Long term (current) use of insulin: Secondary | ICD-10-CM | POA: Diagnosis not present

## 2016-08-06 DIAGNOSIS — N183 Chronic kidney disease, stage 3 (moderate): Secondary | ICD-10-CM | POA: Diagnosis not present

## 2016-08-06 DIAGNOSIS — Z7982 Long term (current) use of aspirin: Secondary | ICD-10-CM | POA: Diagnosis not present

## 2016-08-06 DIAGNOSIS — E1122 Type 2 diabetes mellitus with diabetic chronic kidney disease: Secondary | ICD-10-CM | POA: Diagnosis not present

## 2016-08-06 DIAGNOSIS — I5040 Unspecified combined systolic (congestive) and diastolic (congestive) heart failure: Secondary | ICD-10-CM | POA: Diagnosis not present

## 2016-08-06 DIAGNOSIS — I255 Ischemic cardiomyopathy: Secondary | ICD-10-CM | POA: Diagnosis not present

## 2016-08-06 DIAGNOSIS — Z95810 Presence of automatic (implantable) cardiac defibrillator: Secondary | ICD-10-CM | POA: Diagnosis not present

## 2016-08-06 DIAGNOSIS — Z955 Presence of coronary angioplasty implant and graft: Secondary | ICD-10-CM | POA: Diagnosis not present

## 2016-08-06 DIAGNOSIS — G894 Chronic pain syndrome: Secondary | ICD-10-CM | POA: Diagnosis not present

## 2016-08-06 DIAGNOSIS — I2510 Atherosclerotic heart disease of native coronary artery without angina pectoris: Secondary | ICD-10-CM | POA: Diagnosis not present

## 2016-08-06 DIAGNOSIS — I252 Old myocardial infarction: Secondary | ICD-10-CM | POA: Diagnosis not present

## 2016-08-06 NOTE — Telephone Encounter (Signed)
Yes, please check BMET, Mg, BNP

## 2016-08-06 NOTE — Telephone Encounter (Signed)
fyi

## 2016-08-06 NOTE — Telephone Encounter (Signed)
Called patient, no answer/goes to VM, left msg to call.

## 2016-08-07 ENCOUNTER — Other Ambulatory Visit: Payer: Medicare Other

## 2016-08-07 ENCOUNTER — Telehealth: Payer: Self-pay | Admitting: Cardiovascular Disease

## 2016-08-07 ENCOUNTER — Ambulatory Visit (INDEPENDENT_AMBULATORY_CARE_PROVIDER_SITE_OTHER): Payer: Medicare Other

## 2016-08-07 DIAGNOSIS — I5042 Chronic combined systolic (congestive) and diastolic (congestive) heart failure: Secondary | ICD-10-CM | POA: Diagnosis not present

## 2016-08-07 DIAGNOSIS — Z95810 Presence of automatic (implantable) cardiac defibrillator: Secondary | ICD-10-CM

## 2016-08-07 NOTE — Progress Notes (Signed)
Yes, please get labs today. She was complaining of a lot of leg cramps.

## 2016-08-07 NOTE — Telephone Encounter (Signed)
LEFT MESSAGE TO CALL BACK

## 2016-08-07 NOTE — Progress Notes (Signed)
Call to patient and advised Dr Sallyanne Kuster would like for labs to be drawn today and she agreed. Orders placed for BMET, MG and BNP (see NL triage phone note 08/06/2016) and faxed to [employer].

## 2016-08-07 NOTE — Telephone Encounter (Addendum)
  SPOKE TO PATIENT SHE STATES SHE SPOKE TO NURSE AND HAD LAB WORK DRAWN TODAY

## 2016-08-07 NOTE — Telephone Encounter (Signed)
-----   Message from Raiford Simmonds, RN sent at 08/07/2016  3:28 PM EST ----- Regarding: RE: Reaching patient I would say yes , Dr C. Is the requesting . Unless you are sending a copy of this  message to Dr C. To say otherwise . We do not have a lab within our office but patient will have to use the [facility] in your building on the first floor . She has [facility]-( that is in network for her ) Thanks sharon ----- Message ----- From: [name], RN Sent: 08/07/2016  11:34 AM To: Raiford Simmonds, RN Subject: Reaching patient                               Carlyon Shadow,  Dr C routed me a copy of yesterday's phone note and you guys are trying to reach her to have labs drawn.  I just spoke with patient.  She said nobody called her back yesterday and I told her the office has tried to call her about 3-4 times.   She does not usually get up until noon.  She said her legs are fine today and no pain.  Has not weighed today because she just got up.  I checked her device for fluid and will send copy to Dr C in a few minutes.    I told her I would call her back.  Do you still want her to have some labs drawn today?  If so, can she come there to get them drawn?   Thanks  Sharman Cheek, RN  [facility]

## 2016-08-07 NOTE — Progress Notes (Addendum)
EPIC Encounter for ICM Monitoring  Patient Name: Sydney Phillips is a 81 y.o. female Date: 08/07/2016 Primary Care Physican: Alysia Penna, MD Primary Cardiologist:Croitoru/ Rosaria Ferries, PA Electrophysiologist: Croitoru Baseline Weight: 175 lbs as of 2/21 Bi-V Pacing: 98.4%      Heart Failure questions reviewed, pt has had weight gain in the last few days. She said it has ranged from 168 lbs to 175 lbs yesterday.   Baseline line weight is 163-165 lbs.  Advised Dr Croitoru's office has made several phone attempts to reach her in response to her call 08/06/2016 and symptoms of leg pain.  Today, she does not have any leg pain or hardness in the legs. She says she feels fine except for weight gain.  There was a home nurse that checked her yesterday and told her there was not fluid in the lungs.  She denied any lower extremity swelling and no changes in breathing.  Explained Dr Sallyanne Kuster wants her to have lab work done and asked if she can do that today and she stated yes.  I said I would confirm with his office regarding the lab work and call her back.    Thoracic impedance continues to be abnormal suggesting large amount of fluid accumulation. Fluid index is > 200 threshold  Current prescribed dose of Furosemide 80 mg alternating with 120 mg every other day and Potassium 20 mEq 1 tablet daily and confirmed she is taking her medication.   Labs: 07/09/2016 Creatinine 1.19, BUN 19, Potassium 4.4, Sodium 140 06/30/2016 Creatinine 1.15, BUN 27, Potassium 4.6, Sodium 139, EGFR 44-51 01/14/2018Creatinine 1.12, BUN 26, Potassium 4.3, Sodium 140, EGFR 46-53  01/13/2018Creatinine 1.01, BUN 26, Potassium 3.7, Sodium 141, EGFR 52->60  06/27/2016 Creatinine 1.06, BUN 14, Potassium 3.7, Sodium 140, EGFR 49-57  01/11/2018Creatinine 0.86, BUN 6, Potassium 3.4, Sodium 142, EGFR >60  01/05/2018Creatinine 0.83, BUN 7, Potassium 3.8, Sodium 144  05/31/2016 Creatinine 1.16, BUN 19, Potassium 3.8, Sodium  143, EGFR 44-51  05/30/2016 Creatinine 1.48, BUN 26, Potassium 3.3, Sodium 137, EGFR 33-38  05/29/2016 Creatinine 1.48, BUN 27, Potassium 3.6, Sodium 140  05/23/2016 Creatinine 1.21, BUN 27, Potassium 3.4, Sodium 140, EGFR 42-48  12/07/2017Creatinine 1.27, BUN 24, Potassium 3.6, Sodium 141, EGFR 39-46 05/21/2016 Creatinine 1.42, BUN 32, Potassium 4.0, Sodium 143, EGFR 34-40  05/20/2016 Creatinine 1.27, EGFR 39-46  05/20/2016 Creatinine 1.45, BUN 43, Potassium 4.1, Sodium 139, EGFR 34-39  05/19/2016 Creatinine 1.46, BUN 49, Potassium 3.9, Sodium 140, EGFR 33-39   Recommendations:  Copy of ICM check sent to Dr Sallyanne Kuster for review.   Will confirm labs, BMET, Mg and BNP should be drawn today and call her back.   Follow-up plan: ICM clinic phone appointment on 08/18/2016 to recheck fluid levels.    3 month ICM trend: 08/07/2016   1 Year ICM trend:      [name], RN 08/07/2016 7:55 AM

## 2016-08-07 NOTE — Telephone Encounter (Signed)
Patient walked in stating she needs labs Per device note from today, patient was advised to have lab work - BNP, BMET, Mag Orders were placed in EPIC, but for [facility] Reordered labs for [employer] Patient aware.

## 2016-08-08 ENCOUNTER — Encounter (HOSPITAL_COMMUNITY): Payer: Self-pay | Admitting: Family Medicine

## 2016-08-08 LAB — BASIC METABOLIC PANEL
BUN: 11 mg/dL (ref 7–25)
CALCIUM: 9.3 mg/dL (ref 8.6–10.4)
CHLORIDE: 103 mmol/L (ref 98–110)
CO2: 28 mmol/L (ref 20–31)
CREATININE: 1.05 mg/dL — AB (ref 0.60–0.93)
Glucose, Bld: 136 mg/dL — ABNORMAL HIGH (ref 65–99)
Potassium: 4.1 mmol/L (ref 3.5–5.3)
Sodium: 144 mmol/L (ref 135–146)

## 2016-08-08 LAB — BRAIN NATRIURETIC PEPTIDE: BRAIN NATRIURETIC PEPTIDE: 540.2 pg/mL — AB (ref <100)

## 2016-08-08 LAB — MAGNESIUM: Magnesium: 1.9 mg/dL (ref 1.5–2.5)

## 2016-08-08 NOTE — Progress Notes (Signed)
Mailed patient letter with information about Cardiac Rehab program. MW °

## 2016-08-11 ENCOUNTER — Other Ambulatory Visit: Payer: Self-pay | Admitting: Family Medicine

## 2016-08-12 ENCOUNTER — Telehealth: Payer: Self-pay

## 2016-08-12 ENCOUNTER — Ambulatory Visit (INDEPENDENT_AMBULATORY_CARE_PROVIDER_SITE_OTHER): Payer: Medicare Other

## 2016-08-12 DIAGNOSIS — J449 Chronic obstructive pulmonary disease, unspecified: Secondary | ICD-10-CM | POA: Diagnosis not present

## 2016-08-12 DIAGNOSIS — I5042 Chronic combined systolic (congestive) and diastolic (congestive) heart failure: Secondary | ICD-10-CM

## 2016-08-12 DIAGNOSIS — Z95810 Presence of automatic (implantable) cardiac defibrillator: Secondary | ICD-10-CM

## 2016-08-12 NOTE — Progress Notes (Signed)
EPIC Encounter for ICM Monitoring  Patient Name: Sydney Phillips is a 81 y.o. female Date: 08/12/2016 Primary Care Physican: Alysia Penna, MD Primary Cardiologist:Croitoru Electrophysiologist: Croitoru Dry Weight:  173 lbs  Bi-V Pacing: 97.8%      Heart Failure questions reviewed, pt says she feels fine.  Weight remains 8-10 lbs above baseline.    Baseline weight 163-165 lbs.   Thoracic impedance remains abnormal suggesting fluid accumulation.  Current prescribed dose of Furosemide 80 mg alternating with 120 mg every other day and Potassium 20 mEq 1 tablet daily and confirmed she is taking her medication.   Labs: 08/08/2016 Creatinine 1.05, BUN 11, Potassium 4.1  Sodium 144, BNP 540.2 07/09/2016 Creatinine 1.19, BUN 19, Potassium 4.4, Sodium 140 06/30/2016 Creatinine 1.15, BUN 27, Potassium 4.6, Sodium 139, EGFR 44-51 01/14/2018Creatinine 1.12, BUN 26, Potassium 4.3, Sodium 140, EGFR 46-53  01/13/2018Creatinine 1.01, BUN 26, Potassium 3.7, Sodium 141, EGFR 52->60  06/27/2016 Creatinine 1.06, BUN 14, Potassium 3.7, Sodium 140, EGFR 49-57  01/11/2018Creatinine 0.86, BUN 6,   Potassium 3.4, Sodium 142, EGFR >60  01/05/2018Creatinine 0.83, BUN 7,   Potassium 3.8, Sodium 144  05/31/2016 Creatinine 1.16, BUN 19, Potassium 3.8, Sodium 143, EGFR 44-51  05/30/2016 Creatinine 1.48, BUN 26, Potassium 3.3, Sodium 137, EGFR 33-38  05/29/2016 Creatinine 1.48, BUN 27, Potassium 3.6, Sodium 140  05/23/2016 Creatinine 1.21, BUN 27, Potassium 3.4, Sodium 140, EGFR 42-48  12/07/2017Creatinine 1.27, BUN 24, Potassium 3.6, Sodium 141, EGFR 39-46 05/21/2016 Creatinine 1.42, BUN 32, Potassium 4.0, Sodium 143, EGFR 34-40  05/20/2016 Creatinine 1.27, EGFR 39-46  05/20/2016 Creatinine 1.45, BUN 43, Potassium 4.1, Sodium 139, EGFR 34-39  05/19/2016 Creatinine 1.46, BUN 49, Potassium 3.9, Sodium 140, EGFR 33-39   Recommendations: Advised send copy of ICM note to Dr Sallyanne Kuster and if any  recommendations will call her back. Reviewed lab results with patient.  Follow-up plan: ICM clinic phone appointment on 08/25/2016 which is same day as office visit with Almyra Deforest, PA.     3 month ICM trend: 08/12/2016   1 Year ICM trend:      [name], RN 08/12/2016 7:35 AM

## 2016-08-12 NOTE — Telephone Encounter (Signed)
Remote ICM transmission received.  Attempted patient call and no answer 

## 2016-08-14 ENCOUNTER — Telehealth: Payer: Self-pay | Admitting: Cardiovascular Disease

## 2016-08-14 ENCOUNTER — Telehealth: Payer: Self-pay | Admitting: *Deleted

## 2016-08-14 ENCOUNTER — Ambulatory Visit: Payer: Medicare Other | Admitting: Neurology

## 2016-08-14 NOTE — Telephone Encounter (Signed)
Please make sure that she is taking her diuretic in the morning and never later than 6 hours before bedtime.

## 2016-08-14 NOTE — Telephone Encounter (Signed)
No showed new patient appointment. 

## 2016-08-14 NOTE — Telephone Encounter (Signed)
Time of Lasix was confirmed as taking in am when I spoke with her

## 2016-08-14 NOTE — Telephone Encounter (Signed)
Pt is very upset because she have not heard from her lab work from last Thursday.Pt says she is real short and breath also have some fluid.

## 2016-08-14 NOTE — Telephone Encounter (Signed)
Spoke with patient and reviewed labs  Patient stated does have issues at night where she is up multiple times to urinate and does not make it to her bedside commode.  She does not have a lot of urinating during the day and stated she felt this might be related to her bladder. She is going to follow up with her PCP

## 2016-08-15 ENCOUNTER — Encounter: Payer: Self-pay | Admitting: Neurology

## 2016-08-15 DIAGNOSIS — Z794 Long term (current) use of insulin: Secondary | ICD-10-CM | POA: Diagnosis not present

## 2016-08-15 DIAGNOSIS — Z7982 Long term (current) use of aspirin: Secondary | ICD-10-CM | POA: Diagnosis not present

## 2016-08-15 DIAGNOSIS — I5040 Unspecified combined systolic (congestive) and diastolic (congestive) heart failure: Secondary | ICD-10-CM | POA: Diagnosis not present

## 2016-08-15 DIAGNOSIS — Z955 Presence of coronary angioplasty implant and graft: Secondary | ICD-10-CM | POA: Diagnosis not present

## 2016-08-15 DIAGNOSIS — Z95810 Presence of automatic (implantable) cardiac defibrillator: Secondary | ICD-10-CM | POA: Diagnosis not present

## 2016-08-15 DIAGNOSIS — I2510 Atherosclerotic heart disease of native coronary artery without angina pectoris: Secondary | ICD-10-CM | POA: Diagnosis not present

## 2016-08-15 DIAGNOSIS — I252 Old myocardial infarction: Secondary | ICD-10-CM | POA: Diagnosis not present

## 2016-08-15 DIAGNOSIS — N183 Chronic kidney disease, stage 3 (moderate): Secondary | ICD-10-CM | POA: Diagnosis not present

## 2016-08-15 DIAGNOSIS — G894 Chronic pain syndrome: Secondary | ICD-10-CM | POA: Diagnosis not present

## 2016-08-15 DIAGNOSIS — I255 Ischemic cardiomyopathy: Secondary | ICD-10-CM | POA: Diagnosis not present

## 2016-08-15 DIAGNOSIS — E1122 Type 2 diabetes mellitus with diabetic chronic kidney disease: Secondary | ICD-10-CM | POA: Diagnosis not present

## 2016-08-15 DIAGNOSIS — I130 Hypertensive heart and chronic kidney disease with heart failure and stage 1 through stage 4 chronic kidney disease, or unspecified chronic kidney disease: Secondary | ICD-10-CM | POA: Diagnosis not present

## 2016-08-21 ENCOUNTER — Telehealth: Payer: Self-pay | Admitting: Cardiovascular Disease

## 2016-08-21 NOTE — Telephone Encounter (Signed)
I spoke to patient.  She states she needs to understand what appts she needs to keep and when they are.    Pt has appts w/ cards as follows:  03/12  1130 Remote Defib ck  1500 OV w/ Meng  04/24 OV w/ Croitoru 05/11 OV w/ Croitoru  I advised her she did not have to come to the office for the 1130 appt on 03/12.  She does need to arrive at the [place] office by 1445 on 3/12 for appt w/ Meng.  I advised her to speak with Eulas Post at that appt to see which appt w/ Dr Sallyanne Kuster she should keep. She voiced understanding and agreed with plan.

## 2016-08-21 NOTE — Telephone Encounter (Signed)
New message  Pt call requesting to speak with RN about her up coming appts. Pt has three appts coming and would like to know which appts she needs to keep. Please call back to discuss

## 2016-08-25 ENCOUNTER — Ambulatory Visit (INDEPENDENT_AMBULATORY_CARE_PROVIDER_SITE_OTHER): Payer: Medicare Other

## 2016-08-25 ENCOUNTER — Ambulatory Visit: Payer: Medicare Other | Admitting: Physician Assistant

## 2016-08-25 DIAGNOSIS — I5042 Chronic combined systolic (congestive) and diastolic (congestive) heart failure: Secondary | ICD-10-CM

## 2016-08-25 DIAGNOSIS — Z95810 Presence of automatic (implantable) cardiac defibrillator: Secondary | ICD-10-CM | POA: Diagnosis not present

## 2016-08-25 NOTE — Progress Notes (Signed)
EPIC Encounter for ICM Monitoring  Patient Name: Sydney Phillips is a 81 y.o. female Date: 08/25/2016 Primary Care Physican: Alysia Penna, MD Primary Cardiologist:Croitoru Electrophysiologist: Croitoru Dry Weight:  unknown Bi-V Pacing: 97.6%         Transmission reviewed.  Patient has office appointment with Almyra Deforest, PA today at 3pm.    Thoracic impedance abnormal suggesting fluid accumulation.  Current prescribed dose of Furosemide 80 mg alternating with120 mg every other day and Potassium 20 mEq 1 tablet daily.   Labs: 08/08/2016 Creatinine 1.05, BUN 11, Potassium 4.1  Sodium 144, BNP 540.2 07/09/2016 Creatinine 1.19, BUN 19, Potassium 4.4, Sodium 140 06/30/2016 Creatinine 1.15, BUN 27, Potassium 4.6, Sodium 139, EGFR 44-51 01/14/2018Creatinine 1.12, BUN 26, Potassium 4.3, Sodium 140, EGFR 46-53  01/13/2018Creatinine 1.01, BUN 26, Potassium 3.7, Sodium 141, EGFR 52->60  06/27/2016 Creatinine 1.06, BUN 14, Potassium 3.7, Sodium 140, EGFR 49-57  01/11/2018Creatinine 0.86, BUN 6,   Potassium 3.4, Sodium 142, EGFR >60  01/05/2018Creatinine 0.83, BUN 7,   Potassium 3.8, Sodium 144  05/31/2016 Creatinine 1.16, BUN 19, Potassium 3.8, Sodium 143, EGFR 44-51  05/30/2016 Creatinine 1.48, BUN 26, Potassium 3.3, Sodium 137, EGFR 33-38  05/29/2016 Creatinine 1.48, BUN 27, Potassium 3.6, Sodium 140  05/23/2016 Creatinine 1.21, BUN 27, Potassium 3.4, Sodium 140, EGFR 42-48  12/07/2017Creatinine 1.27, BUN 24, Potassium 3.6, Sodium 141, EGFR 39-46 05/21/2016 Creatinine 1.42, BUN 32, Potassium 4.0, Sodium 143, EGFR 34-40  05/20/2016 Creatinine 1.27, EGFR 39-46  05/20/2016 Creatinine 1.45, BUN 43, Potassium 4.1, Sodium 139, EGFR 34-39  05/19/2016 Creatinine 1.46, BUN 49, Potassium 3.9, Sodium 140, EGFR 33-39   Recommendations:  Patient being seen in office today at 3:00 pm.  Copy of ICM note sent to Dr Sallyanne Kuster and Almyra Deforest, PA for review before office appointment today.   Follow-up  plan: ICM clinic phone appointment on 09/01/2016.  3 month ICM trend: 08/25/2016   1 Year ICM trend:      [name], RN 08/25/2016 7:44 AM

## 2016-09-01 ENCOUNTER — Telehealth: Payer: Self-pay

## 2016-09-01 ENCOUNTER — Ambulatory Visit (INDEPENDENT_AMBULATORY_CARE_PROVIDER_SITE_OTHER): Payer: Medicare Other

## 2016-09-01 DIAGNOSIS — I5042 Chronic combined systolic (congestive) and diastolic (congestive) heart failure: Secondary | ICD-10-CM

## 2016-09-01 DIAGNOSIS — Z95810 Presence of automatic (implantable) cardiac defibrillator: Secondary | ICD-10-CM

## 2016-09-01 NOTE — Telephone Encounter (Signed)
Remote ICM transmission received.  Attempted patient call and no answer 

## 2016-09-01 NOTE — Progress Notes (Signed)
EPIC Encounter for ICM Monitoring  Patient Name: Sydney Phillips is a 81 y.o. female Date: 09/01/2016 Primary Care Physican: Alysia Penna, MD Primary Cardiologist:Croitoru Electrophysiologist: Croitoru DryWeight: unknown Bi-V Pacing: 97.3%       Attempted 2 calls to patient and unable to reach.   Transmission reviewed.    Thoracic impedance abnormal suggesting fluid accumulation.  Current prescribed dose of Furosemide 80 mg alternating with120 mg every other day and Potassium 20 mEq 1 tablet daily.   Labs: 08/08/2016 Creatinine 1.05, BUN 11, Potassium 4.1Sodium 144, BNP 540.2 07/09/2016 Creatinine 1.19, BUN 19, Potassium 4.4, Sodium 140 06/30/2016 Creatinine 1.15, BUN 27, Potassium 4.6, Sodium 139, EGFR 44-51 01/14/2018Creatinine 1.12, BUN 26, Potassium 4.3, Sodium 140, EGFR 46-53  01/13/2018Creatinine 1.01, BUN 26, Potassium 3.7, Sodium 141, EGFR 52->60  06/27/2016 Creatinine 1.06, BUN 14, Potassium 3.7, Sodium 140, EGFR 49-57  01/11/2018Creatinine 0.86, BUN 6, Potassium 3.4, Sodium 142, EGFR >60  01/05/2018Creatinine 0.83, BUN 7, Potassium 3.8, Sodium 144  05/31/2016 Creatinine 1.16, BUN 19, Potassium 3.8, Sodium 143, EGFR 44-51  05/30/2016 Creatinine 1.48, BUN 26, Potassium 3.3, Sodium 137, EGFR 33-38  05/29/2016 Creatinine 1.48, BUN 27, Potassium 3.6, Sodium 140  05/23/2016 Creatinine 1.21, BUN 27, Potassium 3.4, Sodium 140, EGFR 42-48  12/07/2017Creatinine 1.27, BUN 24, Potassium 3.6, Sodium 141, EGFR 39-46 05/21/2016 Creatinine 1.42, BUN 32, Potassium 4.0, Sodium 143, EGFR 34-40  05/20/2016 Creatinine 1.27, EGFR 39-46  05/20/2016 Creatinine 1.45, BUN 43, Potassium 4.1, Sodium 139, EGFR 34-39  05/19/2016 Creatinine 1.46, BUN 49, Potassium 3.9, Sodium 140, EGFR 33-39    Recommendations:  NONE - Unable to reach patient   Follow-up plan: ICM clinic phone appointment on 09/15/2016.  Office appointment with Almyra Deforest, PA 09/05/2016       Copy of ICM check  sent to primary cardiologist and device physician.   3 month ICM trend: 09/01/2016   1 Year ICM trend:      [name], RN 09/01/2016 9:51 AM

## 2016-09-02 ENCOUNTER — Encounter: Payer: Self-pay | Admitting: Neurology

## 2016-09-02 ENCOUNTER — Ambulatory Visit (INDEPENDENT_AMBULATORY_CARE_PROVIDER_SITE_OTHER): Payer: Medicare Other | Admitting: Neurology

## 2016-09-02 VITALS — BP 131/79 | HR 69 | Ht 65.0 in | Wt 173.8 lb

## 2016-09-02 DIAGNOSIS — G9340 Encephalopathy, unspecified: Secondary | ICD-10-CM | POA: Diagnosis not present

## 2016-09-02 NOTE — Progress Notes (Signed)
[facility]    Provider:  Dr Jaynee Eagles Referring Provider: Laurey Morale, MD Primary Care Physician:  Alysia Penna, MD  CC:  Acute encephalopathy  HPI:  Sydney Phillips is a 81 y.o. female here as a referral from Dr. 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 Past medical history of chronic respiratory failure from COPD, obstructive apnea, coronary artery disease status post percutaneous stent, left bundle branch block status post AICD, chronic combined congestive heart failure with an ejection fraction of 25%, carotid stenosis status post left CEA, left subclavian artery stenosis status post stent, chronic pain syndrome, diabetes, hypertension, history of chronic kidney disease, tobacco use.  She is here with her son who gives much information. She says she had a heart attack and went to the hospital. She is better now. She is improving. She was hallucinating and agitated. Her son found her laying on the kitchen floor and she had fell. The ambulance was called. She was admitted. She fell and hit her head on a dresser. She has had lots of falls but reports resolution. She lives with her son. She had ear infection and would fall with dizziness that has resolved. She has not fallen since coming back home, last fell last year. No more falls. She uses her walker all the time. She was placed on steroids at one point and she started "talking crazy" at the nursing facility, she was transported to the hospital and given fluids and stopped steroids and she improved. She has had multiple admissions. Son endorses no more falls. She is back to her baseline now and feeling much better. They have bars in the shower, elevated toilet seats, they have modified their home for safety. She feels great, she cooks dinner every night. She has an alarm necklace to call for help if she falls. She feels she is back to baseline. No other focal neurologic deficits, associated symptoms, inciting  events or modifiable factors.  Reviewed notes, labs and imaging from outside physicians, which showed:  Patient was admitted for acute encephalopathy. Patient was slightly dehydrated on admission improved with gentle IV fluids. Suspicion was for steroid psychosis resolving with discontinuation of steroids. She was recently admitted at [facility] for COPD exacerbation found to have NSTEMI. Had a PCI 05/2016, she became agitated and cooperated, hallucinating seeing her dead husband, talking to people not in the room. On admission she appeared chronically N uncooperative with agitation. Workup for altered mental status included CT of the head, negative for acute intracranial abnormalities. No infiltrate or edema on chest x-ray. Urinalysis was completely negative. Differential included dehydration, steroid psychosis. Patient remained disoriented with waxing and waning delirium. Delirium likely ongoing due to chronic medical conditions, chronic pain and sedating medications. She also has depression and anxiety.  Personally reviewed CT images and agree with the following:  IMPRESSION: 1. Stable.  No acute intracranial abnormality. 2. Atrophy with chronic small vessel white matter ischemic disease.  Review of Systems: Patient complains of symptoms per HPI as well as the following symptoms: No CP, no SOB. Pertinent negatives per HPI. All others negative.   Social History   Social History  . Marital status: Widowed    Spouse name: N/A  . Number of children: 3  . Years of education: 12   Occupational History  . Retired    Social History Main Topics  . Smoking status: Current Every Day Smoker    Packs/day: 0.50    Years: 62.00    Types: Cigarettes  .  Smokeless tobacco: Never Used     Comment: Down to 3-4 cigarettes per day  . Alcohol use No  . Drug use: No  . Sexual activity: No   Other Topics Concern  . Not on file   Social History Narrative   Ok to share information with medical POA,  Son Gabriel Carina   Right-handed   Caffeine: Pepsi    Family History  Problem Relation Age of Onset  . CAD Father   . Heart disease Father   . Hyperlipidemia Father   . Heart disease Mother   . Deep vein thrombosis Son   . Hyperlipidemia    . Colon cancer Maternal Grandmother   . Cancer Sister     ovarian  . Diabetes Sister   . Heart disease Sister   . Anesthesia problems Neg Hx   . Hypotension Neg Hx   . Malignant hyperthermia Neg Hx   . Pseudochol deficiency Neg Hx     Past Medical History:  Diagnosis Date  . Arthritis   . Automatic implantable cardioverter-defibrillator in situ   . Bronchitis   . CAD (coronary artery disease)    a. s/p DES to LCx/RCA 05/2016, ostial LAD disease.  . Cardiomyopathy ([place])   . Chronic back pain   . Chronic combined systolic and diastolic CHF (congestive heart failure) ([place])   . Chronic constipation   . Chronic pain   . CKD (chronic kidney disease), stage III   . Colon polyps 2003.  2015.   HP polyps 2003.  adnomas 2015.  required referal to [facility] for colonoscopic resection of flat polyps.   Marland Kitchen COPD (chronic obstructive pulmonary disease) ([place])   . Dementia   . Depression with anxiety    takes Cymbalta daily  . Diabetes mellitus ([place])   . Early cataracts, bilateral   . Fibromyalgia   . GERD (gastroesophageal reflux disease)    was on meds but was taken off;now watches what she eats  . Hemorrhoids   . History of kidney stones   . Hx of colonic polyps   . Hyperlipemia    takes Crestor daily  . Hypertension    takes Amlodipine and Metoprolol daily  . Insomnia   . LBBB (left bundle branch block)    Stress test 09/03/2010, EF 55  . Myocardial infarction   . PAD (peripheral artery disease) ([facility])    Carotid, subclavian, and lower extremity beds, currently not symptomatic  . Presence of combination internal cardiac defibrillator (ICD) and pacemaker   . Presence of permanent cardiac pacemaker   . Pulmonary hypertension   . S/P  angioplasty with stent, lt. subclavian 07/31/11 08/01/2011  . Subclavian arterial stenosis, lt, with PTA/STENT 07/31/11 08/01/2011  . Syncope 07/28/2011   EF - 50-55, moderate concentric hypertrophy in left ventricle  . Urinary incontinence   . Vertigo    takes Meclizine prn    Past Surgical History:  Procedure Laterality Date  . ABDOMINAL AORTAGRAM N/A 08/12/2013   Procedure: ABDOMINAL Maxcine Ham;  Surgeon: Elam Dutch, MD;  Location[facility] CATH LAB;  Service: Cardiovascular;  Laterality: N/A;  . ABDOMINAL HYSTERECTOMY    . APPENDECTOMY    . BACK SURGERY  2012  . BIV ICD GENERTAOR CHANGE OUT Left 02/20/2012   Procedure: BIV ICD GENERTAOR CHANGE OUT;  Surgeon: [name], MD;  Location[facility] CATH LAB;  Service: Cardiovascular;  Laterality: Left;  . CARDIAC CATHETERIZATION  12/01/2007   By Dr. Melvern Banker, left heart cath,   . CARDIAC CATHETERIZATION  N/A 05/20/2016   Procedure: Right/Left Heart Cath and Coronary Angiography;  Surgeon: Sherren Mocha, MD;  Location: [place] CV LAB;  Service: Cardiovascular;  Laterality: N/A;  . CARDIAC CATHETERIZATION N/A 05/20/2016   Procedure: Coronary Stent Intervention;  Surgeon: Sherren Mocha, MD;  Location: [place] CV LAB;  Service: Cardiovascular;  Laterality: N/A;  . CARDIAC DEFIBRILLATOR PLACEMENT  05/2008   By Dr Blanch Media, [employer] CANNOT HAVE MRI's  . CAROTID ANGIOGRAM N/A 07/31/2011   Procedure: CAROTID ANGIOGRAM;  Surgeon: Lorretta Harp, MD;  Location: [facility] CATH LAB;  Service: Cardiovascular;  Laterality: N/A;  carotid angiogram and possible Lt SCA PTA  . COLONOSCOPY W/ POLYPECTOMY  12/2013  . CORONARY ANGIOPLASTY    . ENDARTERECTOMY Left 11/08/2014   Procedure: LEFT CAROTID ENDARTERECTOMY WITH HEMASHIELD PATCH ANGIOPLASTY;  Surgeon: Elam Dutch, MD;  Location: [place];  Service: Vascular;  Laterality: Left;  . ESOPHAGOGASTRODUODENOSCOPY N/A 01/20/2014   Procedure: ESOPHAGOGASTRODUODENOSCOPY (EGD);  Surgeon: Jerene Bears, MD;  Location: [facility]  ENDOSCOPY;  Service: Endoscopy;  Laterality: N/A;  . FEMORAL-POPLITEAL BYPASS GRAFT Right 10/12/2013   Procedure:   Femoral-Peroneal trunk  bypass with nonreversed greater saphenous vein graft;  Surgeon: Elam Dutch, MD;  Location: [place];  Service: Vascular;  Laterality: Right;  . GIVENS CAPSULE STUDY N/A 01/20/2014   Procedure: GIVENS CAPSULE STUDY;  Surgeon: Jerene Bears, MD;  Location: [place];  Service: Gastroenterology;  Laterality: N/A;  . INTRAOPERATIVE ARTERIOGRAM Right 10/12/2013   Procedure: INTRA OPERATIVE ARTERIOGRAM;  Surgeon: Elam Dutch, MD;  Location: [place];  Service: Vascular;  Laterality: Right;  . ORIF ELBOW FRACTURE  08/16/2011   Procedure: OPEN REDUCTION INTERNAL FIXATION (ORIF) ELBOW/OLECRANON FRACTURE;  Surgeon: Schuyler Amor, MD;  Location: [place];  Service: Orthopedics;  Laterality: Left;  . RENAL ANGIOGRAM N/A 08/12/2013   Procedure: RENAL ANGIOGRAM;  Surgeon: Elam Dutch, MD;  Location[facility] CATH LAB;  Service: Cardiovascular;  Laterality: N/A;  . SUBCLAVIAN STENT PLACEMENT Left 07/31/2011   7x18 Genesis, balloon, with reduction of 90% ostial left subclavian artery stenosis to 0% with residual excellent flow  . TONSILLECTOMY    . TUBAL LIGATION      Current Outpatient Prescriptions  Medication Sig Dispense Refill  . ACCU-CHEK SMARTVIEW test strip CHECK BLOOD SUGAR ONCE DAILY 100 each 1  . albuterol (PROVENTIL HFA;VENTOLIN HFA) 108 (90 Base) MCG/ACT inhaler Inhale 2 puffs into the lungs every 4 (four) hours as needed for wheezing or shortness of breath. 1 Inhaler 5  . allopurinol (ZYLOPRIM) 300 MG tablet Take 1 tablet (300 mg total) by mouth daily. 30 tablet 11  . ALPRAZolam (XANAX) 1 MG tablet Take 1 tablet (1 mg total) by mouth every 6 (six) hours as needed for anxiety. 120 tablet 5  . aspirin 81 MG chewable tablet Chew 1 tablet (81 mg total) by mouth daily. 30 tablet 1  . atorvastatin (LIPITOR) 40 MG tablet Take 1 tablet (40 mg total) by mouth daily  at 6 PM. 90 tablet 3  . bisoprolol (ZEBETA) 5 MG tablet Take 1 tablet (5 mg total) by mouth daily. 90 tablet 3  . clopidogrel (PLAVIX) 75 MG tablet Take 1 tablet (75 mg total) by mouth daily. 60 tablet 11  . DULoxetine (CYMBALTA) 60 MG capsule Take 60 mg by mouth 2 (two) times daily.    . fluticasone (FLONASE) 50 MCG/ACT nasal spray Place 1 spray into both nostrils daily as needed for allergies.     . furosemide (LASIX)  80 MG tablet Take 1-1.5 tablets (80-120 mg total) by mouth as directed. 45 tablet 11  . HYDROcodone-acetaminophen (NORCO) 10-325 MG tablet Take one tablet by mouth every 6 hours as needed for pain. DNE 3gm of APAP from all sources/24hours 120 tablet 0  . lisinopril (PRINIVIL,ZESTRIL) 5 MG tablet Take 1 tablet (5 mg total) by mouth daily. 30 tablet 0  . nitroGLYCERIN (NITROSTAT) 0.4 MG SL tablet Place 0.4 mg under the tongue every 5 (five) minutes as needed for chest pain. X 3 doses    . potassium chloride SA (K-DUR,KLOR-CON) 20 MEQ tablet Take 1 tablet (20 mEq total) by mouth daily. 90 tablet 3   No current facility-administered medications for this visit.     Allergies as of 09/02/2016 - Review Complete 09/02/2016  Allergen Reaction Noted  . Potassium-containing compounds Other (See Comments) 07/29/2013  . Azithromycin Rash 07/14/2011  . Codeine Itching 02/25/2007  . Darvon Itching 07/22/2010  . Erythromycin Rash 07/22/2010  . Meloxicam Other (See Comments) 02/25/2007  . Norco [hydrocodone-acetaminophen] Itching 06/27/2015  . Penicillins Rash and Other (See Comments) 02/25/2007  . Propoxyphene n-acetaminophen Itching 02/25/2007  . Rofecoxib Other (See Comments) 02/25/2007  . Rosuvastatin Other (See Comments) 11/07/2015  . Statins Itching and Other (See Comments) 10/18/2014  . Sulfa antibiotics Rash 07/14/2011    Vitals: BP 131/79   Pulse 69   Ht 5\' 5"  (1.651 m)   Wt 173 lb 12.8 oz (78.8 kg)   BMI 28.92 kg/m  Last Weight:  Wt Readings from Last 1 Encounters:    09/02/16 173 lb 12.8 oz (78.8 kg)   Last Height:   Ht Readings from Last 1 Encounters:  09/02/16 5\' 5"  (1.651 m)    Physical exam: Exam: Gen: NAD, conversant, adentulous, poorly groomed                 CV: RRR, no MRG. No Carotid Bruits. No peripheral edema, warm, nontender Eyes: Conjunctivae clear without exudates or hemorrhage  Neuro: Detailed Neurologic Exam  Speech:    Speech is normal; fluent and spontaneous with normal comprehension.  Cognition:    The patient is oriented to person, month, date, year.    recent and remote memory appears intact;     language fluent;    Intact attention, concentration, fund of knowledge normal for education level Cranial Nerves:    The pupils are equal, round, and reactive to light. Attempted fundoscopic exam could not visualize. Visual fields are full to finger confrontation. Extraocular movements are intact. Trigeminal sensation is intact and the muscles of mastication are normal. The face is symmetric. The palate elevates in the midline. Hearing intact. Voice is normal. Shoulder shrug is normal. The tongue has normal motion without fasciculations.   Coordination:    No dysmetria  Gait:    Wide based, imbalance. With a walker has good stride and stance and is stable.  Motor Observation:    no involuntary movements noted. Tone:    Normal muscle tone.    Posture:    Posture is erect    Strength:    Mild bilat hip flexion weakness. Otherwise strength is V/V in the upper and lower limbs.      Sensation: intact to LT     Reflex Exam:  DTR's:    Deep tendon reflexes are absent in the lowers and normal in the uppers.   Toes:    The toes are equivocal bilaterally.   Clonus:    Clonus is absent.  Assessment/Plan:   81 y.o. female here as a referral from Dr. 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 Past medical history of chronic respiratory failure from COPD, obstructive apnea, coronary artery disease status  post percutaneous stent, left bundle branch block status post AICD, chronic combined congestive heart failure with an ejection fraction of 25%, carotid stenosis status post left CEA, left subclavian artery stenosis status post stent, chronic pain syndrome, diabetes, hypertension, history of chronic kidney disease, tobacco use.  She had multiple hospital admissions In November, [facility] and January one of which was for acute encephalopathy resolved with medical management and steroid discontinuation. She is here with son and they endorse she has not fallen since being home from the hospital, she is using her walker and thei home has safety features, she is back to her cognitive baseline and doing great. She can RTC as needed.  Patient requested I forward this note to the following:  Dr. Sarajane Jews and [place], Dr. Lutricia Horsfall (vein and vascular)   Sarina Ill, MD  [facility] [address] [place], [place] [address]  Phone [phone] Fax [phone]

## 2016-09-02 NOTE — Patient Instructions (Signed)
  As far as your medications are concerned, I would like to suggest: Continue current medications  I would like to see you back as needed, sooner if we need to. Please call us with any interim questions, concerns, problems, updates or refill requests.   Our phone number is [phone]. We also have an after hours call service for urgent matters and there is a physician on-call for urgent questions. For any emergencies you know to call 911 or go to the nearest emergency room

## 2016-09-03 NOTE — Progress Notes (Signed)
Thanks MCr 

## 2016-09-04 ENCOUNTER — Emergency Department (HOSPITAL_COMMUNITY): Payer: Medicare Other

## 2016-09-04 ENCOUNTER — Encounter (HOSPITAL_COMMUNITY): Payer: Self-pay

## 2016-09-04 ENCOUNTER — Emergency Department (HOSPITAL_COMMUNITY)
Admission: EM | Admit: 2016-09-04 | Discharge: 2016-09-04 | Disposition: A | Discharge status: Home / Self Care | Payer: Medicare Other | Attending: Emergency Medicine | Admitting: Emergency Medicine

## 2016-09-04 DIAGNOSIS — Z79899 Other long term (current) drug therapy: Secondary | ICD-10-CM | POA: Insufficient documentation

## 2016-09-04 DIAGNOSIS — S199XXA Unspecified injury of neck, initial encounter: Secondary | ICD-10-CM | POA: Diagnosis not present

## 2016-09-04 DIAGNOSIS — I5043 Acute on chronic combined systolic (congestive) and diastolic (congestive) heart failure: Secondary | ICD-10-CM | POA: Diagnosis not present

## 2016-09-04 DIAGNOSIS — E1122 Type 2 diabetes mellitus with diabetic chronic kidney disease: Secondary | ICD-10-CM | POA: Insufficient documentation

## 2016-09-04 DIAGNOSIS — I252 Old myocardial infarction: Secondary | ICD-10-CM | POA: Diagnosis not present

## 2016-09-04 DIAGNOSIS — R51 Headache: Secondary | ICD-10-CM | POA: Diagnosis not present

## 2016-09-04 DIAGNOSIS — F17210 Nicotine dependence, cigarettes, uncomplicated: Secondary | ICD-10-CM | POA: Insufficient documentation

## 2016-09-04 DIAGNOSIS — N183 Chronic kidney disease, stage 3 (moderate): Secondary | ICD-10-CM | POA: Diagnosis not present

## 2016-09-04 DIAGNOSIS — I2510 Atherosclerotic heart disease of native coronary artery without angina pectoris: Secondary | ICD-10-CM | POA: Diagnosis not present

## 2016-09-04 DIAGNOSIS — S0990XA Unspecified injury of head, initial encounter: Secondary | ICD-10-CM | POA: Diagnosis not present

## 2016-09-04 DIAGNOSIS — J449 Chronic obstructive pulmonary disease, unspecified: Secondary | ICD-10-CM | POA: Insufficient documentation

## 2016-09-04 DIAGNOSIS — Z7982 Long term (current) use of aspirin: Secondary | ICD-10-CM | POA: Diagnosis not present

## 2016-09-04 DIAGNOSIS — I130 Hypertensive heart and chronic kidney disease with heart failure and stage 1 through stage 4 chronic kidney disease, or unspecified chronic kidney disease: Secondary | ICD-10-CM | POA: Diagnosis not present

## 2016-09-04 DIAGNOSIS — R079 Chest pain, unspecified: Secondary | ICD-10-CM | POA: Diagnosis not present

## 2016-09-04 DIAGNOSIS — S299XXA Unspecified injury of thorax, initial encounter: Secondary | ICD-10-CM | POA: Diagnosis not present

## 2016-09-04 DIAGNOSIS — R55 Syncope and collapse: Secondary | ICD-10-CM | POA: Diagnosis not present

## 2016-09-04 LAB — URINALYSIS, ROUTINE W REFLEX MICROSCOPIC
BILIRUBIN URINE: NEGATIVE
Glucose, UA: NEGATIVE mg/dL
Hgb urine dipstick: NEGATIVE
Ketones, ur: NEGATIVE mg/dL
LEUKOCYTES UA: NEGATIVE
NITRITE: NEGATIVE
PH: 6 (ref 5.0–8.0)
Protein, ur: NEGATIVE mg/dL
SPECIFIC GRAVITY, URINE: 1.004 — AB (ref 1.005–1.030)

## 2016-09-04 LAB — COMPREHENSIVE METABOLIC PANEL
ALBUMIN: 4.3 g/dL (ref 3.5–5.0)
ALT: 15 U/L (ref 14–54)
AST: 19 U/L (ref 15–41)
Alkaline Phosphatase: 145 U/L — ABNORMAL HIGH (ref 38–126)
Anion gap: 7 (ref 5–15)
BUN: 13 mg/dL (ref 6–20)
CHLORIDE: 104 mmol/L (ref 101–111)
CO2: 30 mmol/L (ref 22–32)
Calcium: 9.5 mg/dL (ref 8.9–10.3)
Creatinine, Ser: 0.98 mg/dL (ref 0.44–1.00)
GFR calc Af Amer: >60 mL/min (ref >60)
GFR calc non Af Amer: 54 mL/min — ABNORMAL LOW (ref >60)
GLUCOSE: 107 mg/dL — AB (ref 65–99)
POTASSIUM: 3.3 mmol/L — AB (ref 3.5–5.1)
SODIUM: 141 mmol/L (ref 135–145)
Total Bilirubin: 0.6 mg/dL (ref 0.3–1.2)
Total Protein: 7.1 g/dL (ref 6.5–8.1)

## 2016-09-04 LAB — I-STAT TROPONIN, ED: TROPONIN I, POC: 0.02 ng/mL (ref 0.00–0.08)

## 2016-09-04 LAB — CBC WITH DIFFERENTIAL/PLATELET
Basophils Absolute: 0.1 10*3/uL (ref 0.0–0.1)
Basophils Relative: 1 %
EOS PCT: 4 %
Eosinophils Absolute: 0.4 10*3/uL (ref 0.0–0.7)
HEMATOCRIT: 44.2 % (ref 36.0–46.0)
Hemoglobin: 13.9 g/dL (ref 12.0–15.0)
LYMPHS PCT: 22 %
Lymphs Abs: 1.9 10*3/uL (ref 0.7–4.0)
MCH: 28.5 pg (ref 26.0–34.0)
MCHC: 31.4 g/dL (ref 30.0–36.0)
MCV: 90.6 fL (ref 78.0–100.0)
MONO ABS: 0.7 10*3/uL (ref 0.1–1.0)
Monocytes Relative: 7 %
NEUTROS ABS: 5.9 10*3/uL (ref 1.7–7.7)
Neutrophils Relative %: 66 %
PLATELETS: 332 10*3/uL (ref 150–400)
RBC: 4.88 MIL/uL (ref 3.87–5.11)
RDW: 15.5 % (ref 11.5–15.5)
WBC: 8.9 10*3/uL (ref 4.0–10.5)

## 2016-09-04 NOTE — ED Triage Notes (Signed)
Patient fell this afternoon upon standing. States she has felt weak for 2 days and unable to sleep more than an hour for the past month. hy. Of CHF, hypertension, and diabetes. CBG with EMS was 151. Patient CO of feeling tired and a headache.

## 2016-09-04 NOTE — ED Notes (Signed)
Pt ambulated well around pod A, no SOB, O2 @ 91 RA

## 2016-09-04 NOTE — ED Provider Notes (Signed)
[facility] DEPT Provider Note   CSN: [id number] Arrival date & time: 09/04/16  1551  History   Chief Complaint Chief Complaint  Patient presents with  . Near Syncope    HPI Sydney Phillips is a 81 y.o. female on Plavix who presents to the Emergency Department after a syncopal episode precending an unwitnessed fall earlier today. The patient is unable to recall how she landed, but reports her glasses were knocked off of her face and she sustained an abrasion lateral to the left eye. She also reports non-radiating, moderate occipital pain that started after the fall. Her son, with whom she lives,reports slurred speech after the fall that has continued to improved. No facial drooping or paresthesias.  She reports she has felt more weak and tired since last night. Her son notes insomnia as well as she decreased eating and PO fluids. The patient has a h/o of recurrent falls. Last fall 11/17.   PMH includes DM Type II, CHF, HTN, CKD stage III, COPD, and PAD. She has home O2 of 2L/min as needed.   HPI  Past Medical History:  Diagnosis Date  . Arthritis   . Automatic implantable cardioverter-defibrillator in situ   . Bronchitis   . CAD (coronary artery disease)    a. s/p DES to LCx/RCA 05/2016, ostial LAD disease.  . Cardiomyopathy ([place])   . Chronic back pain   . Chronic combined systolic and diastolic CHF (congestive heart failure) ([place])   . Chronic constipation   . Chronic pain   . CKD (chronic kidney disease), stage III   . Colon polyps 2003.  2015.   HP polyps 2003.  adnomas 2015.  required referal to [facility] for colonoscopic resection of flat polyps.   Marland Kitchen COPD (chronic obstructive pulmonary disease) ([place])   . Dementia   . Depression with anxiety    takes Cymbalta daily  . Diabetes mellitus ([place])   . Early cataracts, bilateral   . Fibromyalgia   . GERD (gastroesophageal reflux disease)    was on meds but was taken off;now watches what she eats  . Hemorrhoids   . History  of kidney stones   . Hx of colonic polyps   . Hyperlipemia    takes Crestor daily  . Hypertension    takes Amlodipine and Metoprolol daily  . Insomnia   . LBBB (left bundle branch block)    Stress test 09/03/2010, EF 55  . Myocardial infarction   . PAD (peripheral artery disease) (HCC)    Carotid, subclavian, and lower extremity beds, currently not symptomatic  . Presence of combination internal cardiac defibrillator (ICD) and pacemaker   . Presence of permanent cardiac pacemaker   . Pulmonary hypertension   . S/P angioplasty with stent, lt. subclavian 07/31/11 08/01/2011  . Subclavian arterial stenosis, lt, with PTA/STENT 07/31/11 08/01/2011  . Syncope 07/28/2011   EF - 50-55, moderate concentric hypertrophy in left ventricle  . Urinary incontinence   . Vertigo    takes Meclizine prn   Patient Active Problem List   Diagnosis Date Noted  . Acute on chronic combined systolic and diastolic CHF (congestive heart failure) ([place]) 06/26/2016  . Elevated troponin 06/26/2016  . Acute on chronic systolic heart failure ([place]) 06/26/2016  . Steroid-induced psychosis, with hallucinations ([place])   . Chronic respiratory failure with hypoxia ([place])   . Acute encephalopathy 05/30/2016  . Chronic combined systolic and diastolic CHF (congestive heart failure) ([place]) 05/30/2016  . CKD (chronic kidney disease), stage III 05/30/2016  .  Elevated lactic acid level 05/30/2016  . Leukocytosis   . Stenosis of left subclavian artery ([place]) 05/19/2016  . Fall at home   . Somnolence   . OSA and COPD overlap syndrome ([place])   . Cardiomyopathy, ischemic   . Ischemic mitral valve regurgitation   . Pulmonary hypertension   . Controlled diabetes mellitus type 2 with complications ([place])   . Encephalopathy   . CAD in native artery   . Acute renal failure with acute tubular necrosis superimposed on stage 3 chronic kidney disease ([place])   . COPD exacerbation ([place]) 05/14/2016  . Acute on chronic combined systolic and  diastolic ACC/AHA stage C congestive heart failure ([place]) 05/14/2016  . Pleural effusion   . Acute on chronic respiratory failure with hypoxia ([place])   . CHF exacerbation ([place]) 04/06/2016  . Acute respiratory distress 04/06/2016  . Altered mental state 02/26/2016  . Syncopal episodes 02/26/2016  . Acute kidney injury ([place]) 02/26/2016  . Skin lesion of back/upper right shoulder 02/26/2016  . Cardiomyopathy ([place]) 11/07/2015  . Smoking 11/07/2015  . Arthralgia 10/12/2015  . Left carotid stenosis 11/08/2014  . Diabetes mellitus ([place]) 05/02/2014  . NSTEMI (non-ST elevated myocardial infarction) ([place]) 01/20/2014  . Melena 01/20/2014  . Microcytic anemia 01/19/2014  . Chest pain 01/19/2014  . PAD (peripheral artery disease) ([place]) 10/12/2013  . S/P angioplasty with stent, lt. subclavian 07/31/11 08/01/2011  . Biventricular implantable cardioverter-defibrillator in situ 07/28/2011  . PVD, known severe, (previuosly asymptomatic) LSCA disease, now with "high grade" RICA disease. 07/28/2011  . Bronchitis, recent flare 07/28/2011  . Syncope,possible related to subclavian steal syndrome 07/27/2011  . Vertigo 07/27/2011  . Hypokalemia 07/27/2011  . SPINAL STENOSIS 01/28/2010  . CONSTIPATION 01/14/2010  . BACK PAIN, LUMBAR 01/01/2010  . Insomnia 01/23/2009  . ALLERGIC RHINITIS 11/10/2008  . Coronary atherosclerosis 06/21/2008  . HIP PAIN, BILATERAL 06/21/2008  . Myalgia and myositis 11/26/2007  . HLD (hyperlipidemia) 07/28/2007  . ACUTE SINUSITIS, UNSPECIFIED 07/28/2007  . WEIGHT GAIN 06/04/2007  . DYSPNEA 06/04/2007  . COPD (chronic obstructive pulmonary disease) with emphysema ([place]) 03/02/2007  . Depression with anxiety 02/25/2007  . Essential hypertension 02/25/2007  . GERD 02/25/2007  . COLONIC POLYPS, HX OF 02/25/2007    Past Surgical History:  Procedure Laterality Date  . ABDOMINAL AORTAGRAM N/A 08/12/2013   Procedure: ABDOMINAL Maxcine Ham;  Surgeon: Elam Dutch, MD;  Location:  [facility] CATH LAB;  Service: Cardiovascular;  Laterality: N/A;  . ABDOMINAL HYSTERECTOMY    . APPENDECTOMY    . BACK SURGERY  2012  . BIV ICD GENERTAOR CHANGE OUT Left 02/20/2012   Procedure: BIV ICD GENERTAOR CHANGE OUT;  Surgeon: [name], MD;  Location: [facility] CATH LAB;  Service: Cardiovascular;  Laterality: Left;  . CARDIAC CATHETERIZATION  12/01/2007   By Dr. Melvern Banker, left heart cath,   . CARDIAC CATHETERIZATION N/A 05/20/2016   Procedure: Right/Left Heart Cath and Coronary Angiography;  Surgeon: Sherren Mocha, MD;  Location: [facility] CV LAB;  Service: Cardiovascular;  Laterality: N/A;  . CARDIAC CATHETERIZATION N/A 05/20/2016   Procedure: Coronary Stent Intervention;  Surgeon: Sherren Mocha, MD;  Location: [facility] CV LAB;  Service: Cardiovascular;  Laterality: N/A;  . CARDIAC DEFIBRILLATOR PLACEMENT  05/2008   By Dr Blanch Media, [employer] CANNOT HAVE MRI's  . CAROTID ANGIOGRAM N/A 07/31/2011   Procedure: CAROTID ANGIOGRAM;  Surgeon: Lorretta Harp, MD;  Location: [facility] CATH LAB;  Service: Cardiovascular;  Laterality: N/A;  carotid angiogram and possible Lt SCA PTA  . COLONOSCOPY  W/ POLYPECTOMY  12/2013  . CORONARY ANGIOPLASTY    . ENDARTERECTOMY Left 11/08/2014   Procedure: LEFT CAROTID ENDARTERECTOMY WITH HEMASHIELD PATCH ANGIOPLASTY;  Surgeon: Elam Dutch, MD;  Location: [facility];  Service: Vascular;  Laterality: Left;  . ESOPHAGOGASTRODUODENOSCOPY N/A 01/20/2014   Procedure: ESOPHAGOGASTRODUODENOSCOPY (EGD);  Surgeon: Jerene Bears, MD;  Location: [facility] ENDOSCOPY;  Service: Endoscopy;  Laterality: N/A;  . FEMORAL-POPLITEAL BYPASS GRAFT Right 10/12/2013   Procedure:   Femoral-Peroneal trunk  bypass with nonreversed greater saphenous vein graft;  Surgeon: Elam Dutch, MD;  Location: [facility];  Service: Vascular;  Laterality: Right;  . GIVENS CAPSULE STUDY N/A 01/20/2014   Procedure: GIVENS CAPSULE STUDY;  Surgeon: Jerene Bears, MD;  Location: [facility];  Service: Gastroenterology;  Laterality:  N/A;  . INTRAOPERATIVE ARTERIOGRAM Right 10/12/2013   Procedure: INTRA OPERATIVE ARTERIOGRAM;  Surgeon: Elam Dutch, MD;  Location: [facility];  Service: Vascular;  Laterality: Right;  . ORIF ELBOW FRACTURE  08/16/2011   Procedure: OPEN REDUCTION INTERNAL FIXATION (ORIF) ELBOW/OLECRANON FRACTURE;  Surgeon: Schuyler Amor, MD;  Location: [facility];  Service: Orthopedics;  Laterality: Left;  . RENAL ANGIOGRAM N/A 08/12/2013   Procedure: RENAL ANGIOGRAM;  Surgeon: Elam Dutch, MD;  Location: [facility] CATH LAB;  Service: Cardiovascular;  Laterality: N/A;  . SUBCLAVIAN STENT PLACEMENT Left 07/31/2011   7x18 Genesis, balloon, with reduction of 90% ostial left subclavian artery stenosis to 0% with residual excellent flow  . TONSILLECTOMY    . TUBAL LIGATION      OB History    No data available       Home Medications    Prior to Admission medications   Medication Sig Start Date End Date Taking? Authorizing Provider  albuterol (PROVENTIL HFA;VENTOLIN HFA) 108 (90 Base) MCG/ACT inhaler Inhale 2 puffs into the lungs every 4 (four) hours as needed for wheezing or shortness of breath. 10/12/15  Yes Laurey Morale, MD  allopurinol (ZYLOPRIM) 300 MG tablet Take 1 tablet (300 mg total) by mouth daily. 10/15/15  Yes Laurey Morale, MD  ALPRAZolam Duanne Moron) 1 MG tablet Take 1 tablet (1 mg total) by mouth every 6 (six) hours as needed for anxiety. Patient taking differently: Take 2 mg by mouth at bedtime.  07/30/16  Yes Laurey Morale, MD  aspirin 81 MG chewable tablet Chew 1 tablet (81 mg total) by mouth daily. 05/23/16  Yes Reyne Dumas, MD  atorvastatin (LIPITOR) 40 MG tablet Take 1 tablet (40 mg total) by mouth daily at 6 PM. 06/20/16  Yes Laurey Morale, MD  bisoprolol (ZEBETA) 5 MG tablet Take 1 tablet (5 mg total) by mouth daily. 06/20/16  Yes Laurey Morale, MD  clopidogrel (PLAVIX) 75 MG tablet Take 1 tablet (75 mg total) by mouth daily. 07/30/16  Yes Laurey Morale, MD  DULoxetine (CYMBALTA) 60 MG capsule Take 60 mg  by mouth 2 (two) times daily.   Yes Historical Provider, MD  fluticasone (FLONASE) 50 MCG/ACT nasal spray Place 1 spray into both nostrils daily as needed for allergies.    Yes Historical Provider, MD  furosemide (LASIX) 80 MG tablet Take 1-1.5 tablets (80-120 mg total) by mouth as directed. 07/25/16  Yes Mihai Croitoru, MD  HYDROcodone-acetaminophen (NORCO) 10-325 MG tablet Take one tablet by mouth every 6 hours as needed for pain. DNE 3gm of APAP from all sources/24hours 07/09/16  Yes Laurey Morale, MD  lisinopril (PRINIVIL,ZESTRIL) 5 MG tablet Take 1 tablet (5 mg total) by  mouth daily. 07/01/16  Yes Janece Canterbury, MD  nitroGLYCERIN (NITROSTAT) 0.4 MG SL tablet Place 0.4 mg under the tongue every 5 (five) minutes as needed for chest pain. X 3 doses   Yes Historical Provider, MD  potassium chloride SA (K-DUR,KLOR-CON) 20 MEQ tablet Take 1 tablet (20 mEq total) by mouth daily. 06/20/16  Yes Laurey Morale, MD  thiamine 100 MG tablet Take 100 mg by mouth daily.   Yes Historical Provider, MD    Family History Family History  Problem Relation Age of Onset  . CAD Father   . Heart disease Father   . Hyperlipidemia Father   . Heart disease Mother   . Deep vein thrombosis Son   . Hyperlipidemia    . Colon cancer Maternal Grandmother   . Cancer Sister     ovarian  . Diabetes Sister   . Heart disease Sister   . Anesthesia problems Neg Hx   . Hypotension Neg Hx   . Malignant hyperthermia Neg Hx   . Pseudochol deficiency Neg Hx     Social History Social History  Substance Use Topics  . Smoking status: Current Every Day Smoker    Packs/day: 0.50    Years: 62.00    Types: Cigarettes  . Smokeless tobacco: Never Used     Comment: Down to 3-4 cigarettes per day  . Alcohol use No     Allergies   Potassium-containing compounds; Azithromycin; Codeine; Darvon; Erythromycin; Meloxicam; Norco [hydrocodone-acetaminophen]; Penicillins; Propoxyphene n-acetaminophen; Rofecoxib; Rosuvastatin; Statins;  and Sulfa antibiotics   Review of Systems Review of Systems  Constitutional: Positive for activity change. Negative for chills, diaphoresis and fever.  HENT: Negative for congestion.   Eyes: Negative for visual disturbance.  Respiratory: Negative for cough and shortness of breath.   Cardiovascular: Negative for chest pain.  Gastrointestinal: Negative for abdominal pain, diarrhea, nausea and vomiting.  Genitourinary: Negative for difficulty urinating and dysuria.  Musculoskeletal: Negative for back pain.  Skin: Positive for wound.  Allergic/Immunologic: Positive for immunocompromised state.  Neurological: Positive for syncope and headaches. Negative for dizziness, facial asymmetry, speech difficulty, light-headedness and numbness.  Psychiatric/Behavioral: Negative for confusion.   Physical Exam Updated Vital Signs BP (!) 142/72   Pulse 78   Temp 98.4 F (36.9 C) (Oral)   Resp 19   Ht 5\' 5"  (1.651 m)   Wt 74.8 kg   SpO2 94%   BMI 27.46 kg/m   Physical Exam  Constitutional: She is oriented to person, place, and time. No distress.  Obese elderly female  HENT:  Head: Normocephalic.  Right Ear: Tympanic membrane and external ear normal. Tympanic membrane is not injected, not erythematous, not retracted and not bulging. No middle ear effusion.  Left Ear: Tympanic membrane and external ear normal. Tympanic membrane is not injected, not erythematous, not retracted and not bulging.  No middle ear effusion.  TTP over the occiput. No ecchymosis, erythema, warmth, or swelling to the occiput. No abrasions or wounds. Skin is intact. No obvious deformities.  Eyes: Conjunctivae and EOM are normal. Pupils are equal, round, and reactive to light.  No nystagmus.   Neck: Neck supple.  Cardiovascular: Normal rate and regular rhythm.   Pt has a pacemaker.   Pulmonary/Chest: Effort normal.  Scattered rhonchi in the bilateral bases.   Abdominal: Soft. She exhibits no distension. There is no  tenderness.  Musculoskeletal: She exhibits no edema, tenderness or deformity.  Neurological: She is alert and oriented to person, place, and time. She has normal  strength. No cranial nerve deficit or sensory deficit. She exhibits normal muscle tone. Coordination normal. GCS eye subscore is 4. GCS verbal subscore is 5. GCS motor subscore is 6.  A&O x3; 5/5 strength to the bilateral upper and lower extremities. CN II-XII are intact. No sensory deficits. Patient is able to ambulate through the unit with the assistance of a walker.   Skin: Skin is warm and dry. Capillary refill takes less than 2 seconds. She is not diaphoretic. No erythema.  There is a 0.5 cm hemostatic superficial abrasion immediately lateral to the left eye.   Psychiatric: She has a normal mood and affect. Her behavior is normal. Judgment and thought content normal.  Nursing note and vitals reviewed.  ED Treatments / Results  Labs (all labs ordered are listed, but only abnormal results are displayed) Labs Reviewed  COMPREHENSIVE METABOLIC PANEL - Abnormal; Notable for the following:       Result Value   Potassium 3.3 (*)    Glucose, Bld 107 (*)    Alkaline Phosphatase 145 (*)    GFR calc non Af Amer 54 (*)    All other components within normal limits  URINALYSIS, ROUTINE W REFLEX MICROSCOPIC - Abnormal; Notable for the following:    Specific Gravity, Urine 1.004 (*)    All other components within normal limits  CBC WITH DIFFERENTIAL/PLATELET  Randolm Idol, ED   EKG  EKG Interpretation  Date/Time:  Thursday September 04 2016 16:02:40 EDT Ventricular Rate:  80 PR Interval:    QRS Duration: 168 QT Interval:  373 QTC Calculation: 431 R Axis:   -30 Text Interpretation:  Atrial-sensed ventricular-paced complexes No further analysis attempted due to paced rhythm Artifact in lead(s) I II aVR aVL No significant change since last tracing Confirmed by [facility] MD, JULIE ([id number]) on 09/04/2016 4:05:17 PM       Radiology Dg  Chest 2 View  Result Date: 09/04/2016 CLINICAL DATA:  Pain following fall.  Dizziness. EXAM: CHEST  2 VIEW COMPARISON:  June 26, 2016 FINDINGS: There is cardiomegaly with pulmonary venous hypertension. Pacemaker leads are attached to the right atrium, right ventricle, and coronary sinus. No pneumothorax. There is interstitial edema with small left pleural effusion. There is mild alveolar opacity in the left base, likely alveolar edema. There is a calcified granuloma in the superior segment left lower lobe, stable. No adenopathy. There is aortic atherosclerosis. No bone lesions. IMPRESSION: Chronic congestive heart failure. Suspect alveolar edema left base; a small amount of pneumonia in this area cannot be excluded. Calcified granuloma superior segment left lower lobe. Stable pacemaker lead positioning. There is aortic atherosclerosis. Electronically Signed   By: Lowella Grip III M.D.   On: 09/04/2016 17:13   Ct Head Wo Contrast  Result Date: 09/04/2016 CLINICAL DATA:  Weakness with history of fall headache EXAM: CT HEAD WITHOUT CONTRAST CT CERVICAL SPINE WITHOUT CONTRAST TECHNIQUE: Multidetector CT imaging of the head and cervical spine was performed following the standard protocol without intravenous contrast. Multiplanar CT image reconstructions of the cervical spine were also generated. COMPARISON:  06/26/2016 ,06/03/2016 FINDINGS: CT HEAD FINDINGS Brain: No acute territorial infarction, hemorrhage or focal mass lesion is visualized. Mild atrophy. Mild to moderate periventricular and subcortical white matter small vessel ischemic changes. Stable ventricle size. Vascular: No hyperdense vessels.  Carotid artery calcification. Skull: Normal. Negative for fracture or focal lesion. Sinuses/Orbits: Mucosal thickening in the ethmoid sinuses. No acute orbital abnormality. Other: None CT CERVICAL SPINE FINDINGS Alignment: Within normal limits. No subluxation. Facet  alignment maintained. Skull base and  vertebrae: Craniovertebral junction appears intact. Trace fluid in the right mastoid. No fracture. Soft tissues and spinal canal: No prevertebral fluid or swelling. No visible canal hematoma. Disc levels: Moderate degenerative disc changes at C5-C6 and C6-C7. Multilevel hypertrophic facet arthropathy results in multiple level foraminal stenosis, most marked on the right side at C5-C6 and on the left at C4-C5. Moderate foraminal stenosis left greater than right at C3-C4. Upper chest: Mild emphysema and hazy atelectasis. Multiple subcentimeter hypodense thyroid nodules. Carotid artery calcification. Partially visualized cardiac pacing leads in the upper mediastinum. Other: None IMPRESSION: 1. No CT evidence for acute intracranial abnormality. Atrophy and white matter small vessel ischemic changes 2. Moderate multilevel degenerative disc changes. No acute fracture or malalignment. Electronically Signed   By: Donavan Foil M.D.   On: 09/04/2016 18:31   Ct Cervical Spine Wo Contrast  Result Date: 09/04/2016 CLINICAL DATA:  Weakness with history of fall headache EXAM: CT HEAD WITHOUT CONTRAST CT CERVICAL SPINE WITHOUT CONTRAST TECHNIQUE: Multidetector CT imaging of the head and cervical spine was performed following the standard protocol without intravenous contrast. Multiplanar CT image reconstructions of the cervical spine were also generated. COMPARISON:  06/26/2016 ,06/03/2016 FINDINGS: CT HEAD FINDINGS Brain: No acute territorial infarction, hemorrhage or focal mass lesion is visualized. Mild atrophy. Mild to moderate periventricular and subcortical white matter small vessel ischemic changes. Stable ventricle size. Vascular: No hyperdense vessels.  Carotid artery calcification. Skull: Normal. Negative for fracture or focal lesion. Sinuses/Orbits: Mucosal thickening in the ethmoid sinuses. No acute orbital abnormality. Other: None CT CERVICAL SPINE FINDINGS Alignment: Within normal limits. No subluxation. Facet  alignment maintained. Skull base and vertebrae: Craniovertebral junction appears intact. Trace fluid in the right mastoid. No fracture. Soft tissues and spinal canal: No prevertebral fluid or swelling. No visible canal hematoma. Disc levels: Moderate degenerative disc changes at C5-C6 and C6-C7. Multilevel hypertrophic facet arthropathy results in multiple level foraminal stenosis, most marked on the right side at C5-C6 and on the left at C4-C5. Moderate foraminal stenosis left greater than right at C3-C4. Upper chest: Mild emphysema and hazy atelectasis. Multiple subcentimeter hypodense thyroid nodules. Carotid artery calcification. Partially visualized cardiac pacing leads in the upper mediastinum. Other: None IMPRESSION: 1. No CT evidence for acute intracranial abnormality. Atrophy and white matter small vessel ischemic changes 2. Moderate multilevel degenerative disc changes. No acute fracture or malalignment. Electronically Signed   By: Donavan Foil M.D.   On: 09/04/2016 18:31    Procedures Procedures (including critical care time)  Medications Ordered in ED Medications - No data to display   Initial Impression / Assessment and Plan / ED Course  I have reviewed the triage vital signs and the nursing notes.  Pertinent labs & imaging results that were available during my care of the patient were reviewed by me and considered in my medical decision making (see chart for details).    81 year old chronically ill, morbidly ill female on Plavix presents to the Emergency Department after a syncopal episode followed by an unwitnessed fall. Hemostatic superficial abrasion to the left eye s/p fall. She also c/o new pain to the occiput. CT head and cervical spine shows no acute bleeds or fractures. Neuro exam is reassuring. Pt has remained hemodynamically stable throughout their time in the ED. She was also evaluated by Dr. Gilford Raid, who agreed with the work up and plan.   Pt has a medtronic pacemaker  d/t CHF. She is unsure if the pacemaker fire  around the time of the syncopal episode. After an investigation of the pacemaker, reassured the patient it was functioning properly and did not fire.  Work-up for other causes of syncopal episode was unrevealing. Will plan for discharge home with close follow-up.  Possibility of recurrent syncope has been discussed. Discussed the plan with the patient and her son with whom she lives, and both are agreeable to the plan at this time.    BP (!) 142/72   Pulse 78   Temp 98.4 F (36.9 C) (Oral)   Resp 19   Ht 5\' 5"  (1.651 m)   Wt 74.8 kg   SpO2 94%   BMI 27.46 kg/m    Final Clinical Impressions(s) / ED Diagnoses   Final diagnoses:  Syncope, unspecified syncope type    New Prescriptions Discharge Medication List as of 09/04/2016  8:49 PM       [place], PA-C 09/05/16 0038    Isla Pence, MD 09/06/16 1304

## 2016-09-05 ENCOUNTER — Ambulatory Visit: Payer: Medicare Other | Admitting: Physician Assistant

## 2016-09-05 NOTE — Progress Notes (Deleted)
Cardiology Office Note    Date:  09/05/2016   ID:  Sydney Phillips, Sydney Phillips [birth year], MRN [id number]  PCP:  Alysia Penna, MD  Cardiologist:  Dr. Sallyanne Kuster  No chief complaint on file.   History of Present Illness:  Sydney Phillips is a 81 y.o. female with PMH of CKD stage III, DM, HTN, HLD, LBBB, PAD s/p L subclavian stent in 2013 and L CEA in 2016, CAD, mixed ischemic and nonischemic cardiomyopathy s/p CRT-D device with significant response and improvement of EF. She previously had a ejection fraction of 20% in 2009, after CRT, her LV function improved to 50% by 2013. Her ejection fraction has changed several times over the years, echocardiogram obtained in September 2017 showed EF 40-45%, then dropped to 25-30% in November 2017 when she had NSTEMI, in the setting of COPD exacerbation, respiratory failure, acute renal failure and syncope. Cardiac catheterization showed severe stenosis of the right coronary artery and the left circumflex artery both treated with drug-eluting stents. There was also moderate ostial stenosis in the LAD. She was enrolled in [place] study. Repeat echo in January 2018 showed EF 20-25% with severely dilated left ventricle, moderate mitral regurgitation, systolic PA pressure 53 mmHg.   She was admitted in December 15 with altered mental status was suspicion of steroid induced psychosis and discharged to skilled nursing facility, but again seen in the emergency room on December 19 after a fall with head injury. Her mental status gradually improved and felt to be back at baseline in early January. After that hospitalization, her diuretic was held for acute renal insufficiency, she was a admitted in January 11 with acute exacerbation of heart failure. This was improved on diuretic and discharged with a weight of 171 pounds. She has since been here-year-old in remote CHF monitoring, her Lasix impotence as showing extreme relatively, consistent with her alternating episode of  hypo-and hypervolemia.  She was last seen in the office on 07/25/2016, she has very narrow marginal compensation. Since last office visit, patient has presented to the emergency room yesterday after a syncopal episode preceded unwitnessed fall. She had an abrasion near her left eye. CT of the head and cervical spine showed fracture. Recent CRT-D interrogation showed a drop in thoracic impedance and increasing fluid overload. Chest x-ray showed mild interstitial edema with small left pleural effusion.   Entresto  No EKG   Past Medical History:  Diagnosis Date  . Arthritis   . Automatic implantable cardioverter-defibrillator in situ   . Bronchitis   . CAD (coronary artery disease)    a. s/p DES to LCx/RCA 05/2016, ostial LAD disease.  . Cardiomyopathy ([place])   . Chronic back pain   . Chronic combined systolic and diastolic CHF (congestive heart failure) ([place])   . Chronic constipation   . Chronic pain   . CKD (chronic kidney disease), stage III   . Colon polyps 2003.  2015.   HP polyps 2003.  adnomas 2015.  required referal to [facility] for colonoscopic resection of flat polyps.   Marland Kitchen COPD (chronic obstructive pulmonary disease) ([place])   . Dementia   . Depression with anxiety    takes Cymbalta daily  . Diabetes mellitus ([place])   . Early cataracts, bilateral   . Fibromyalgia   . GERD (gastroesophageal reflux disease)    was on meds but was taken off;now watches what she eats  . Hemorrhoids   . History of kidney stones   . Hx of colonic polyps   .  Hyperlipemia    takes Crestor daily  . Hypertension    takes Amlodipine and Metoprolol daily  . Insomnia   . LBBB (left bundle branch block)    Stress test 09/03/2010, EF 55  . Myocardial infarction   . PAD (peripheral artery disease) ([facility])    Carotid, subclavian, and lower extremity beds, currently not symptomatic  . Presence of combination internal cardiac defibrillator (ICD) and pacemaker   . Presence of permanent cardiac pacemaker   .  Pulmonary hypertension   . S/P angioplasty with stent, lt. subclavian 07/31/11 08/01/2011  . Subclavian arterial stenosis, lt, with PTA/STENT 07/31/11 08/01/2011  . Syncope 07/28/2011   EF - 50-55, moderate concentric hypertrophy in left ventricle  . Urinary incontinence   . Vertigo    takes Meclizine prn    Past Surgical History:  Procedure Laterality Date  . ABDOMINAL AORTAGRAM N/A 08/12/2013   Procedure: ABDOMINAL Maxcine Ham;  Surgeon: Elam Dutch, MD;  Location: [facility] CATH LAB;  Service: Cardiovascular;  Laterality: N/A;  . ABDOMINAL HYSTERECTOMY    . APPENDECTOMY    . BACK SURGERY  2012  . BIV ICD GENERTAOR CHANGE OUT Left 02/20/2012   Procedure: BIV ICD GENERTAOR CHANGE OUT;  Surgeon: [name], MD;  Location: [facility] CATH LAB;  Service: Cardiovascular;  Laterality: Left;  . CARDIAC CATHETERIZATION  12/01/2007   By Dr. Melvern Banker, left heart cath,   . CARDIAC CATHETERIZATION N/A 05/20/2016   Procedure: Right/Left Heart Cath and Coronary Angiography;  Surgeon: Sherren Mocha, MD;  Location: [place] CV LAB;  Service: Cardiovascular;  Laterality: N/A;  . CARDIAC CATHETERIZATION N/A 05/20/2016   Procedure: Coronary Stent Intervention;  Surgeon: Sherren Mocha, MD;  Location: [place] CV LAB;  Service: Cardiovascular;  Laterality: N/A;  . CARDIAC DEFIBRILLATOR PLACEMENT  05/2008   By Dr Blanch Media, [employer] CANNOT HAVE MRI's  . CAROTID ANGIOGRAM N/A 07/31/2011   Procedure: CAROTID ANGIOGRAM;  Surgeon: Lorretta Harp, MD;  Location: [facility] CATH LAB;  Service: Cardiovascular;  Laterality: N/A;  carotid angiogram and possible Lt SCA PTA  . COLONOSCOPY W/ POLYPECTOMY  12/2013  . CORONARY ANGIOPLASTY    . ENDARTERECTOMY Left 11/08/2014   Procedure: LEFT CAROTID ENDARTERECTOMY WITH HEMASHIELD PATCH ANGIOPLASTY;  Surgeon: Elam Dutch, MD;  Location: [place];  Service: Vascular;  Laterality: Left;  . ESOPHAGOGASTRODUODENOSCOPY N/A 01/20/2014   Procedure: ESOPHAGOGASTRODUODENOSCOPY (EGD);  Surgeon: Jerene Bears, MD;  Location: [facility];  Service: Endoscopy;  Laterality: N/A;  . FEMORAL-POPLITEAL BYPASS GRAFT Right 10/12/2013   Procedure:   Femoral-Peroneal trunk  bypass with nonreversed greater saphenous vein graft;  Surgeon: Elam Dutch, MD;  Location: [place];  Service: Vascular;  Laterality: Right;  . GIVENS CAPSULE STUDY N/A 01/20/2014   Procedure: GIVENS CAPSULE STUDY;  Surgeon: Jerene Bears, MD;  Location: [place];  Service: Gastroenterology;  Laterality: N/A;  . INTRAOPERATIVE ARTERIOGRAM Right 10/12/2013   Procedure: INTRA OPERATIVE ARTERIOGRAM;  Surgeon: Elam Dutch, MD;  Location: [place];  Service: Vascular;  Laterality: Right;  . ORIF ELBOW FRACTURE  08/16/2011   Procedure: OPEN REDUCTION INTERNAL FIXATION (ORIF) ELBOW/OLECRANON FRACTURE;  Surgeon: Schuyler Amor, MD;  Location: [place];  Service: Orthopedics;  Laterality: Left;  . RENAL ANGIOGRAM N/A 08/12/2013   Procedure: RENAL ANGIOGRAM;  Surgeon: Elam Dutch, MD;  Location: [facility] CATH LAB;  Service: Cardiovascular;  Laterality: N/A;  . SUBCLAVIAN STENT PLACEMENT Left 07/31/2011   7x18 [place], balloon, with reduction of 90% ostial left subclavian artery stenosis  to 0% with residual excellent flow  . TONSILLECTOMY    . TUBAL LIGATION      Current Medications: Outpatient Medications Prior to Visit  Medication Sig Dispense Refill  . albuterol (PROVENTIL HFA;VENTOLIN HFA) 108 (90 Base) MCG/ACT inhaler Inhale 2 puffs into the lungs every 4 (four) hours as needed for wheezing or shortness of breath. 1 Inhaler 5  . allopurinol (ZYLOPRIM) 300 MG tablet Take 1 tablet (300 mg total) by mouth daily. 30 tablet 11  . ALPRAZolam (XANAX) 1 MG tablet Take 1 tablet (1 mg total) by mouth every 6 (six) hours as needed for anxiety. (Patient taking differently: Take 2 mg by mouth at bedtime. ) 120 tablet 5  . aspirin 81 MG chewable tablet Chew 1 tablet (81 mg total) by mouth daily. 30 tablet 1  . atorvastatin (LIPITOR) 40 MG tablet  Take 1 tablet (40 mg total) by mouth daily at 6 PM. 90 tablet 3  . bisoprolol (ZEBETA) 5 MG tablet Take 1 tablet (5 mg total) by mouth daily. 90 tablet 3  . clopidogrel (PLAVIX) 75 MG tablet Take 1 tablet (75 mg total) by mouth daily. 60 tablet 11  . DULoxetine (CYMBALTA) 60 MG capsule Take 60 mg by mouth 2 (two) times daily.    . fluticasone (FLONASE) 50 MCG/ACT nasal spray Place 1 spray into both nostrils daily as needed for allergies.     . furosemide (LASIX) 80 MG tablet Take 1-1.5 tablets (80-120 mg total) by mouth as directed. 45 tablet 11  . HYDROcodone-acetaminophen (NORCO) 10-325 MG tablet Take one tablet by mouth every 6 hours as needed for pain. DNE 3gm of APAP from all sources/24hours 120 tablet 0  . lisinopril (PRINIVIL,ZESTRIL) 5 MG tablet Take 1 tablet (5 mg total) by mouth daily. 30 tablet 0  . nitroGLYCERIN (NITROSTAT) 0.4 MG SL tablet Place 0.4 mg under the tongue every 5 (five) minutes as needed for chest pain. X 3 doses    . potassium chloride SA (K-DUR,KLOR-CON) 20 MEQ tablet Take 1 tablet (20 mEq total) by mouth daily. 90 tablet 3  . thiamine 100 MG tablet Take 100 mg by mouth daily.     No facility-administered medications prior to visit.      Allergies:   Potassium-containing compounds; Azithromycin; Codeine; Darvon; Erythromycin; Meloxicam; Norco [hydrocodone-acetaminophen]; Penicillins; Propoxyphene n-acetaminophen; Rofecoxib; Rosuvastatin; Statins; and Sulfa antibiotics   Social History   Social History  . Marital status: Widowed    Spouse name: N/A  . Number of children: 3  . Years of education: 12   Occupational History  . Retired    Social History Main Topics  . Smoking status: Current Every Day Smoker    Packs/day: 0.50    Years: 62.00    Types: Cigarettes  . Smokeless tobacco: Never Used     Comment: Down to 3-4 cigarettes per day  . Alcohol use No  . Drug use: No  . Sexual activity: No   Other Topics Concern  . Not on file   Social History  Narrative   Ok to share information with medical POA, Son Gabriel Carina   Right-handed   Caffeine: Pepsi     Family History:  The patient's ***family history includes CAD in her father; Cancer in her sister; Colon cancer in her maternal grandmother; Deep vein thrombosis in her son; Diabetes in her sister; Heart disease in her father, mother, and sister; Hyperlipidemia in her father.   ROS:   Please see the history of present illness.  ROS All other systems reviewed and are negative.   PHYSICAL EXAM:   VS:  There were no vitals taken for this visit.   GEN: Well nourished, well developed, in no acute distress  HEENT: normal  Neck: no JVD, carotid bruits, or masses Cardiac: ***RRR; no murmurs, rubs, or gallops,no edema  Respiratory:  clear to auscultation bilaterally, normal work of breathing GI: soft, nontender, nondistended, + BS MS: no deformity or atrophy  Skin: warm and dry, no rash Neuro:  Alert and Oriented x 3, Strength and sensation are intact Psych: euthymic mood, full affect  Wt Readings from Last 3 Encounters:  09/04/16 165 lb (74.8 kg)  09/02/16 173 lb 12.8 oz (78.8 kg)  07/30/16 172 lb (78 kg)      Studies/Labs Reviewed:   EKG:  EKG is*** ordered today.  The ekg ordered today demonstrates ***  Recent Labs: 05/14/2016: TSH 1.013 08/07/2016: Brain Natriuretic Peptide 540.2; Magnesium 1.9 09/04/2016: ALT 15; BUN 13; Creatinine, Ser 0.98; Hemoglobin 13.9; Platelets 332; Potassium 3.3; Sodium 141   Lipid Panel    Component Value Date/Time   CHOL 193 05/15/2016 0248   TRIG 116 05/15/2016 0248   HDL 25 (L) 05/15/2016 0248   CHOLHDL 7.7 05/15/2016 0248   VLDL 23 05/15/2016 0248   LDLCALC 145 (H) 05/15/2016 0248   LDLDIRECT 152.0 05/30/2015 1452    Additional studies/ records that were reviewed today include:   Cath 05/20/2016 Conclusion   1. Severe 2 vessel CAD of the RCA and LCx, treated successfully with drug-eluting stents in each vessel 2. Moderate  ostial LAD stenosis 3. Congestive heart failure with elevated PCWP/LVEDP 4. Moderate pulmonary HTN likely related to left heart disease (PVR 2 [place] units)  DAPT with ASA and Brilinta 12 months - consider [place] Clinical Trial. Gentle fluids x 4 hours, likely further diuresis tomorrow if renal function stable.      Echo 06/27/2016 LV EF: 20% -   25%  - Left ventricle: The cavity size was severely dilated. Wall   thickness was normal. Systolic function was severely reduced. The   estimated ejection fraction was in the range of 20% to 25%.   Diffuse hypokinesis. Doppler parameters are consistent with both   elevated ventricular end-diastolic filling pressure and elevated   left atrial filling pressure. - Aortic valve: Valve area (VTI): 2.05 cm^2. Valve area (Vmax):   1.93 cm^2. Valve area (Vmean): 1.99 cm^2. - Mitral valve: There was moderate regurgitation. Valve area by   continuity equation (using LVOT flow): 2.19 cm^2. - Left atrium: The atrium was moderately dilated. - Atrial septum: No defect or patent foramen ovale was identified. - Pulmonary arteries: PA peak pressure: 53 mm Hg (S).    Chest x ray 09/04/2016 IMPRESSION: Chronic congestive heart failure. Suspect alveolar edema left base; a small amount of pneumonia in this area cannot be excluded. Calcified granuloma superior segment left lower lobe. Stable pacemaker lead positioning. There is aortic atherosclerosis.    CT of head and neck 09/04/2016 IMPRESSION: 1. No CT evidence for acute intracranial abnormality. Atrophy and white matter small vessel ischemic changes 2. Moderate multilevel degenerative disc changes. No acute fracture or malalignment.    ASSESSMENT:    No diagnosis found.   PLAN:  In order of problems listed above:  1. ***    Medication Adjustments/Labs and Tests Ordered: Current medicines are reviewed at length with the patient today.  Concerns regarding medicines are outlined above.   Medication changes, Labs and Tests ordered today  are listed in the Patient Instructions below. There are no Patient Instructions on file for this visit.   Hilbert Corrigan, [place]  09/05/2016 12:57 PM    [facility] [address], [place], [place]  [address] Phone: [phone]; Fax: [phone]

## 2016-09-08 ENCOUNTER — Encounter: Payer: Self-pay | Admitting: *Deleted

## 2016-09-09 ENCOUNTER — Ambulatory Visit (INDEPENDENT_AMBULATORY_CARE_PROVIDER_SITE_OTHER): Payer: Medicare Other | Admitting: Family Medicine

## 2016-09-09 ENCOUNTER — Encounter: Payer: Self-pay | Admitting: Family Medicine

## 2016-09-09 VITALS — BP 146/83 | HR 87 | Temp 98.1°F | Ht 65.0 in | Wt 174.0 lb

## 2016-09-09 DIAGNOSIS — J449 Chronic obstructive pulmonary disease, unspecified: Secondary | ICD-10-CM | POA: Diagnosis not present

## 2016-09-09 DIAGNOSIS — R55 Syncope and collapse: Secondary | ICD-10-CM

## 2016-09-09 DIAGNOSIS — I255 Ischemic cardiomyopathy: Secondary | ICD-10-CM | POA: Diagnosis not present

## 2016-09-09 DIAGNOSIS — I5042 Chronic combined systolic (congestive) and diastolic (congestive) heart failure: Secondary | ICD-10-CM

## 2016-09-09 DIAGNOSIS — I2510 Atherosclerotic heart disease of native coronary artery without angina pectoris: Secondary | ICD-10-CM | POA: Diagnosis not present

## 2016-09-09 MED ORDER — TRAZODONE HCL 50 MG PO TABS: 50.0000 mg | ORAL_TABLET | Freq: Every day | ORAL | 5 refills | Status: DC

## 2016-09-09 MED ORDER — TRAZODONE HCL 50 MG PO TABS: 25.0000 mg | ORAL_TABLET | Freq: Every day | ORAL | 3 refills | Status: DC

## 2016-09-09 MED ORDER — LISINOPRIL 5 MG PO TABS: 5.0000 mg | ORAL_TABLET | Freq: Every day | ORAL | 3 refills | Status: DC

## 2016-09-09 NOTE — Progress Notes (Signed)
   Subjective:    Patient ID: Sydney Phillips, female    DOB: [birth year], 81 y.o.   MRN: [id number]  HPI Here to follow up on an ER visit on 09-04-16 for a syncopal episode. She was in her home and when she stood up out of a chair she apparently blacked out and fell. She struck her left forehead and the back of her head on the ground. She was taken by EMS to the ER where her exam was otherwise normal. A head CT was negative. Since then she has felt fine and denies any other sx.    Review of Systems  Constitutional: Negative.   Respiratory: Negative.   Cardiovascular: Negative.   Neurological: Positive for syncope. Negative for dizziness, tremors, seizures, facial asymmetry, speech difficulty, weakness, light-headedness, numbness and headaches.       Objective:   Physical Exam  Constitutional: She is oriented to person, place, and time. She appears well-developed and well-nourished.  Uses a walker   Neck: No thyromegaly present.  Cardiovascular: Normal rate, regular rhythm, normal heart sounds and intact distal pulses.   Pulmonary/Chest: Effort normal and breath sounds normal.  Lymphadenopathy:    She has no cervical adenopathy.  Neurological: She is alert and oriented to person, place, and time.          Assessment & Plan:  Syncopal spell, probably transient orthostasis. She will get up slowly from a seated position and always use her walker. Try Trazodone 50 mg for sleep. Alysia Penna, MD

## 2016-09-09 NOTE — Progress Notes (Signed)
Pre visit review using our clinic review tool, if applicable. No additional management support is needed unless otherwise documented below in the visit note. 

## 2016-09-09 NOTE — Patient Instructions (Signed)
WE NOW OFFER   [facility] FAST TRACK!!!  SAME DAY Appointments for ACUTE CARE  Such as: Sprains, Injuries, cuts, abrasions, rashes, muscle pain, joint pain, back pain Colds, flu, sore throats, headache, allergies, cough, fever  Ear pain, sinus and eye infections Abdominal pain, nausea, vomiting, diarrhea, upset stomach Animal/insect bites  3 Easy Ways to Schedule: Walk-In Scheduling Call in scheduling Mychart Sign-up: [email or web address]         

## 2016-09-15 ENCOUNTER — Ambulatory Visit (INDEPENDENT_AMBULATORY_CARE_PROVIDER_SITE_OTHER): Payer: Self-pay

## 2016-09-15 ENCOUNTER — Telehealth: Payer: Self-pay | Admitting: Family Medicine

## 2016-09-15 ENCOUNTER — Telehealth: Payer: Self-pay

## 2016-09-15 DIAGNOSIS — Z95810 Presence of automatic (implantable) cardiac defibrillator: Secondary | ICD-10-CM

## 2016-09-15 DIAGNOSIS — I5042 Chronic combined systolic (congestive) and diastolic (congestive) heart failure: Secondary | ICD-10-CM

## 2016-09-15 NOTE — Telephone Encounter (Signed)
Pt need new Rx for Hydrocodone   Pt is aware of 3 business days for refills and someone will call when ready for pick up. °

## 2016-09-15 NOTE — Progress Notes (Signed)
EPIC Encounter for ICM Monitoring  Patient Name: Sydney Phillips is a 81 y.o. female Date: 09/15/2016 Primary Care Physican: Alysia Penna, MD Primary Cardiologist:Croitoru Electrophysiologist: Croitoru DryWeight: unknown Bi-V Pacing: 97.3%      Attempted call to patient and unable to reach.  Transmission reviewed.    Thoracic impedance abnormal suggesting fluid accumulation.  Current prescribed dose of Furosemide 80 mg alternating with120 mg every other day and Potassium 20 mEq 1 tablet daily.   Labs: 09/04/2016 Creatinine 0.98, BUN 13, Potassium 3.3, Sodium 141 08/08/2016 Creatinine 1.05, BUN 11, Potassium 4.1Sodium 144, BNP 540.2 07/09/2016 Creatinine 1.19, BUN 19, Potassium 4.4, Sodium 140 06/30/2016 Creatinine 1.15, BUN 27, Potassium 4.6, Sodium 139, EGFR 44-51 01/14/2018Creatinine 1.12, BUN 26, Potassium 4.3, Sodium 140, EGFR 46-53  01/13/2018Creatinine 1.01, BUN 26, Potassium 3.7, Sodium 141, EGFR 52->60  06/27/2016 Creatinine 1.06, BUN 14, Potassium 3.7, Sodium 140, EGFR 49-57  01/11/2018Creatinine 0.86, BUN 6, Potassium 3.4, Sodium 142, EGFR >60  01/05/2018Creatinine 0.83, BUN 7, Potassium 3.8, Sodium 144  05/31/2016 Creatinine 1.16, BUN 19, Potassium 3.8, Sodium 143, EGFR 44-51  05/30/2016 Creatinine 1.48, BUN 26, Potassium 3.3, Sodium 137, EGFR 33-38  05/29/2016 Creatinine 1.48, BUN 27, Potassium 3.6, Sodium 140  05/23/2016 Creatinine 1.21, BUN 27, Potassium 3.4, Sodium 140, EGFR 42-48  12/07/2017Creatinine 1.27, BUN 24, Potassium 3.6, Sodium 141, EGFR 39-46 05/21/2016 Creatinine 1.42, BUN 32, Potassium 4.0, Sodium 143, EGFR 34-40  05/20/2016 Creatinine 1.27, EGFR 39-46  05/20/2016 Creatinine 1.45, BUN 43, Potassium 4.1, Sodium 139, EGFR 34-39  05/19/2016 Creatinine 1.46, BUN 49, Potassium 3.9, Sodium 140, EGFR 33-39    Recommendations: NONE - Unable to reach patient   Follow-up plan: ICM clinic phone appointment on 09/19/2016.  Office appointment  scheduled 10/07/2016 with Dr Sallyanne Kuster.  Copy of ICM check sent to cardiologist/device physician.   3 month ICM trend: 09/15/2016   1 Year ICM trend:      [name], RN 09/15/2016 9:20 AM

## 2016-09-15 NOTE — Telephone Encounter (Signed)
Remote ICM transmission received.  Attempted patient call and no answer 

## 2016-09-16 MED ORDER — HYDROCODONE-ACETAMINOPHEN 10-325 MG PO TABS: ORAL_TABLET | ORAL | 0 refills | Status: DC

## 2016-09-16 NOTE — Telephone Encounter (Signed)
Done

## 2016-09-17 NOTE — Telephone Encounter (Signed)
Script is ready for pick up here at front office and left a voice message with this information for pt.

## 2016-09-19 ENCOUNTER — Telehealth: Payer: Self-pay

## 2016-09-19 ENCOUNTER — Ambulatory Visit (INDEPENDENT_AMBULATORY_CARE_PROVIDER_SITE_OTHER): Payer: Medicare Other

## 2016-09-19 DIAGNOSIS — I5042 Chronic combined systolic (congestive) and diastolic (congestive) heart failure: Secondary | ICD-10-CM

## 2016-09-19 DIAGNOSIS — Z95810 Presence of automatic (implantable) cardiac defibrillator: Secondary | ICD-10-CM

## 2016-09-19 NOTE — Telephone Encounter (Signed)
Remote ICM transmission received.  Attempted patient call and left message to return call.   

## 2016-09-19 NOTE — Progress Notes (Signed)
EPIC Encounter for ICM Monitoring  Patient Name: Sydney Phillips is a 81 y.o. female Date: 09/19/2016 Primary Care Physican: Alysia Penna, MD Primary Cardiologist:Croitoru Electrophysiologist: Croitoru DryWeight: unknown Bi-V Pacing: 98.3%       Attempted call to patient x 2 and unable to reach.  Left message to return call.  Transmission reviewed.    Thoracic impedance abnormal suggesting fluid accumulation and worse since last ICM transmission on 09/15/2016.  Current prescribed dose of Furosemide 80 mg alternating with120 mg every other day and Potassium 20 mEq 1 tablet daily.   Labs: 09/04/2016 Creatinine 0.98, BUN 13, Potassium 3.3, Sodium 141 08/08/2016 Creatinine 1.05, BUN 11, Potassium 4.1Sodium 144, BNP 540.2 07/09/2016 Creatinine 1.19, BUN 19, Potassium 4.4, Sodium 140 06/30/2016 Creatinine 1.15, BUN 27, Potassium 4.6, Sodium 139, EGFR 44-51 01/14/2018Creatinine 1.12, BUN 26, Potassium 4.3, Sodium 140, EGFR 46-53  01/13/2018Creatinine 1.01, BUN 26, Potassium 3.7, Sodium 141, EGFR 52->60  06/27/2016 Creatinine 1.06, BUN 14, Potassium 3.7, Sodium 140, EGFR 49-57  01/11/2018Creatinine 0.86, BUN 6, Potassium 3.4, Sodium 142, EGFR >60  01/05/2018Creatinine 0.83, BUN 7, Potassium 3.8, Sodium 144  05/31/2016 Creatinine 1.16, BUN 19, Potassium 3.8, Sodium 143, EGFR 44-51  05/30/2016 Creatinine 1.48, BUN 26, Potassium 3.3, Sodium 137, EGFR 33-38  05/29/2016 Creatinine 1.48, BUN 27, Potassium 3.6, Sodium 140  05/23/2016 Creatinine 1.21, BUN 27, Potassium 3.4, Sodium 140, EGFR 42-48  12/07/2017Creatinine 1.27, BUN 24, Potassium 3.6, Sodium 141, EGFR 39-46 05/21/2016 Creatinine 1.42, BUN 32, Potassium 4.0, Sodium 143, EGFR 34-40  05/20/2016 Creatinine 1.27, EGFR 39-46  05/20/2016 Creatinine 1.45, BUN 43, Potassium 4.1, Sodium 139, EGFR 34-39  05/19/2016 Creatinine 1.46, BUN 49, Potassium 3.9, Sodium 140, EGFR 33-39  Recommendations: NONE - Unable to reach patient    Follow-up plan: ICM clinic phone appointment on 09/30/2016.  Office appointment scheduled on 10/07/2016 with Dr Sallyanne Kuster.  Copy of ICM check sent to cardiologist/device physician.   3 month ICM trend: 09/19/2016   1 Year ICM trend:      [name], RN 09/19/2016 8:12 AM

## 2016-09-19 NOTE — Telephone Encounter (Signed)
Attempted 2nd ICM call and no answer.  

## 2016-09-25 ENCOUNTER — Encounter: Payer: Self-pay | Admitting: Family Medicine

## 2016-09-25 ENCOUNTER — Ambulatory Visit (INDEPENDENT_AMBULATORY_CARE_PROVIDER_SITE_OTHER): Payer: Medicare Other | Admitting: Family Medicine

## 2016-09-25 VITALS — BP 161/91 | HR 92 | Temp 98.6°F | Ht 65.0 in | Wt 176.0 lb

## 2016-09-25 DIAGNOSIS — I255 Ischemic cardiomyopathy: Secondary | ICD-10-CM | POA: Diagnosis not present

## 2016-09-25 DIAGNOSIS — E118 Type 2 diabetes mellitus with unspecified complications: Secondary | ICD-10-CM | POA: Diagnosis not present

## 2016-09-25 DIAGNOSIS — I5043 Acute on chronic combined systolic (congestive) and diastolic (congestive) heart failure: Secondary | ICD-10-CM | POA: Diagnosis not present

## 2016-09-25 DIAGNOSIS — I272 Pulmonary hypertension, unspecified: Secondary | ICD-10-CM

## 2016-09-25 LAB — CBC WITH DIFFERENTIAL/PLATELET
Basophils Absolute: 0 10*3/uL (ref 0.0–0.1)
Basophils Relative: 0.3 % (ref 0.0–3.0)
EOS ABS: 0.3 10*3/uL (ref 0.0–0.7)
EOS PCT: 2.9 % (ref 0.0–5.0)
HCT: 42.1 % (ref 36.0–46.0)
HEMOGLOBIN: 13.2 g/dL (ref 12.0–15.0)
LYMPHS PCT: 14.5 % (ref 12.0–46.0)
Lymphs Abs: 1.4 10*3/uL (ref 0.7–4.0)
MCHC: 31.4 g/dL (ref 30.0–36.0)
MCV: 89.4 fl (ref 78.0–100.0)
Monocytes Absolute: 0.9 10*3/uL (ref 0.1–1.0)
Monocytes Relative: 9.5 % (ref 3.0–12.0)
NEUTROS ABS: 7.2 10*3/uL (ref 1.4–7.7)
NEUTROS PCT: 72.8 % (ref 43.0–77.0)
Platelets: 329 10*3/uL (ref 150.0–400.0)
RBC: 4.71 Mil/uL (ref 3.87–5.11)
RDW: 17.6 % — ABNORMAL HIGH (ref 11.5–15.5)
WBC: 9.8 10*3/uL (ref 4.0–10.5)

## 2016-09-25 LAB — HEPATIC FUNCTION PANEL
ALK PHOS: 130 U/L — AB (ref 39–117)
ALT: 16 U/L (ref 0–35)
AST: 15 U/L (ref 0–37)
Albumin: 4.2 g/dL (ref 3.5–5.2)
BILIRUBIN TOTAL: 0.8 mg/dL (ref 0.2–1.2)
Bilirubin, Direct: 0.2 mg/dL (ref 0.0–0.3)
Total Protein: 6.6 g/dL (ref 6.0–8.3)

## 2016-09-25 LAB — BASIC METABOLIC PANEL
BUN: 19 mg/dL (ref 6–23)
CALCIUM: 9.8 mg/dL (ref 8.4–10.5)
CO2: 34 mEq/L — ABNORMAL HIGH (ref 19–32)
Chloride: 101 mEq/L (ref 96–112)
Creatinine, Ser: 1.09 mg/dL (ref 0.40–1.20)
GFR: 51.51 mL/min — ABNORMAL LOW (ref >60.00)
GLUCOSE: 123 mg/dL — AB (ref 70–99)
Potassium: 3.9 mEq/L (ref 3.5–5.1)
SODIUM: 143 meq/L (ref 135–145)

## 2016-09-25 LAB — POC URINALSYSI DIPSTICK (AUTOMATED)
BILIRUBIN UA: NEGATIVE
Blood, UA: NEGATIVE
GLUCOSE UA: NEGATIVE
Ketones, UA: NEGATIVE
LEUKOCYTES UA: NEGATIVE
Nitrite, UA: NEGATIVE
Protein, UA: NEGATIVE
Spec Grav, UA: 1.01 (ref 1.010–1.025)
Urobilinogen, UA: 0.2 E.U./dL
pH, UA: 6.5 (ref 5.0–8.0)

## 2016-09-25 LAB — HEMOGLOBIN A1C: Hgb A1c MFr Bld: 7.2 % — ABNORMAL HIGH (ref 4.6–6.5)

## 2016-09-25 MED ORDER — POTASSIUM CHLORIDE CRYS ER 20 MEQ PO TBCR: 20.0000 meq | EXTENDED_RELEASE_TABLET | Freq: Two times a day (BID) | ORAL | 3 refills | Status: DC

## 2016-09-25 NOTE — Patient Instructions (Signed)
WE NOW OFFER   [facility] FAST TRACK!!!  SAME DAY Appointments for ACUTE CARE  Such as: Sprains, Injuries, cuts, abrasions, rashes, muscle pain, joint pain, back pain Colds, flu, sore throats, headache, allergies, cough, fever  Ear pain, sinus and eye infections Abdominal pain, nausea, vomiting, diarrhea, upset stomach Animal/insect bites  3 Easy Ways to Schedule: Walk-In Scheduling Call in scheduling Mychart Sign-up: [email or web address]         

## 2016-09-25 NOTE — Progress Notes (Signed)
   Subjective:    Patient ID: Sydney Phillips, female    DOB: February 09, 1936, 81 y.o.   MRN: [id number]  HPI Here to check on a few issues. First she has felt a bit weak all over for the past week, and she has had more muscle cramps than usual. She is taking Lasix one tab one day and then 1.5 tabs the next day. Last night she had to urinate more than usual. No burning or discomfort. Her fasting glucose this am was 171. Of note she feel off her back porch 3 days ago but did not injure herself. She lost her balance leaning over to water some flowers. No LOC.    Review of Systems  Respiratory: Negative.   Cardiovascular: Negative.   Gastrointestinal: Negative.   Genitourinary: Positive for frequency. Negative for dysuria, flank pain and hematuria.  Neurological: Positive for weakness. Negative for dizziness, tremors, seizures, syncope, facial asymmetry, speech difficulty, light-headedness, numbness and headaches.       Objective:   Physical Exam  Constitutional: She is oriented to person, place, and time. She appears well-developed and well-nourished.  Walks with a walker   Neck: No thyromegaly present.  Cardiovascular: Normal rate, regular rhythm, normal heart sounds and intact distal pulses.   Pulmonary/Chest: Effort normal and breath sounds normal. No respiratory distress. She has no wheezes. She has no rales.  Abdominal: Soft. Bowel sounds are normal. She exhibits no distension.  Lymphadenopathy:    She has no cervical adenopathy.  Neurological: She is alert and oriented to person, place, and time. No cranial nerve deficit. She exhibits normal muscle tone. Coordination normal.          Assessment & Plan:  She had a recent fall with no apparent injuries. She has felt weak and has had muscle cramps, most likely her potassium level is low. We will get labs today to check this, but we will go ahead and increase her potassium 20 mEq to take one tablet BID. Check a UA to rule out any  infection. Keep the Lasix dosing as it is. Check her diabetes with an A1c.  Alysia Penna, MD

## 2016-09-25 NOTE — Progress Notes (Signed)
Pre visit review using our clinic review tool, if applicable. No additional management support is needed unless otherwise documented below in the visit note. 

## 2016-09-30 ENCOUNTER — Telehealth: Payer: Self-pay | Admitting: Cardiology

## 2016-09-30 ENCOUNTER — Ambulatory Visit (INDEPENDENT_AMBULATORY_CARE_PROVIDER_SITE_OTHER): Payer: Medicare Other

## 2016-09-30 DIAGNOSIS — H25811 Combined forms of age-related cataract, right eye: Secondary | ICD-10-CM | POA: Diagnosis not present

## 2016-09-30 DIAGNOSIS — I5042 Chronic combined systolic (congestive) and diastolic (congestive) heart failure: Secondary | ICD-10-CM

## 2016-09-30 DIAGNOSIS — H25812 Combined forms of age-related cataract, left eye: Secondary | ICD-10-CM | POA: Diagnosis not present

## 2016-09-30 DIAGNOSIS — Z95810 Presence of automatic (implantable) cardiac defibrillator: Secondary | ICD-10-CM

## 2016-09-30 LAB — HM DIABETES EYE EXAM

## 2016-09-30 NOTE — Telephone Encounter (Signed)
Confirmed remote transmission w/ pt son.    

## 2016-10-02 ENCOUNTER — Telehealth: Payer: Self-pay

## 2016-10-02 NOTE — Progress Notes (Signed)
EPIC Encounter for ICM Monitoring  Patient Name: Sydney Phillips is a 81 y.o. female Date: 10/02/2016 Primary Care Physican: Alysia Penna, MD Primary Cardiologist:Croitoru Electrophysiologist: Croitoru DryWeight: unknown Bi-V Pacing: 98.5%      Attempted call to patient and unable to reach.  Transmission reviewed.    Thoracic impedance abnormal suggesting fluid accumulation.  Current prescribed dose of Furosemide 80 mg alternating with120 mg every other day and Potassium 20 mEq 1 tablet daily.   Labs: 09/25/2016 Creatinine 1.09, BUN 34, Potassium 3.9,  Sodium 143  09/04/2016 Creatinine 0.98, BUN 13, Potassium 3.3, Sodium 141 08/08/2016 Creatinine 1.05, BUN 11, Potassium 4.1Sodium 144, BNP 540.2 07/09/2016 Creatinine 1.19, BUN 19, Potassium 4.4, Sodium 140 06/30/2016 Creatinine 1.15, BUN 27, Potassium 4.6, Sodium 139, EGFR 44-51 01/14/2018Creatinine 1.12, BUN 26, Potassium 4.3, Sodium 140, EGFR 46-53  01/13/2018Creatinine 1.01, BUN 26, Potassium 3.7, Sodium 141, EGFR 52->60  06/27/2016 Creatinine 1.06, BUN 14, Potassium 3.7, Sodium 140, EGFR 49-57  01/11/2018Creatinine 0.86, BUN 6, Potassium 3.4, Sodium 142, EGFR >60  01/05/2018Creatinine 0.83, BUN 7, Potassium 3.8, Sodium 144  05/31/2016 Creatinine 1.16, BUN 19, Potassium 3.8, Sodium 143, EGFR 44-51  05/30/2016 Creatinine 1.48, BUN 26, Potassium 3.3, Sodium 137, EGFR 33-38  05/29/2016 Creatinine 1.48, BUN 27, Potassium 3.6, Sodium 140  05/23/2016 Creatinine 1.21, BUN 27, Potassium 3.4, Sodium 140, EGFR 42-48  12/07/2017Creatinine 1.27, BUN 24, Potassium 3.6, Sodium 141, EGFR 39-46 05/21/2016 Creatinine 1.42, BUN 32, Potassium 4.0, Sodium 143, EGFR 34-40  05/20/2016 Creatinine 1.27, EGFR 39-46  05/20/2016 Creatinine 1.45, BUN 43, Potassium 4.1, Sodium 139, EGFR 34-39  05/19/2016 Creatinine 1.46, BUN 49, Potassium 3.9, Sodium 140, EGFR 33-39  Recommendations: NONE - Unable to reach patient   Follow-up plan: ICM  clinic phone appointment on 10/20/2016 to recheck fluid levels.  Office appointment scheduled on 10/07/2016 with Dr Sallyanne Kuster.  Copy of ICM check sent to cardiologist and device physician.   3 month ICM trend: 09/30/2016   1 Year ICM trend:      [name], RN 10/02/2016 1:20 PM

## 2016-10-02 NOTE — Telephone Encounter (Signed)
Remote ICM transmission received.  Attempted patient call and no message left.

## 2016-10-07 ENCOUNTER — Encounter: Payer: Self-pay | Admitting: Cardiovascular Disease

## 2016-10-07 ENCOUNTER — Ambulatory Visit (INDEPENDENT_AMBULATORY_CARE_PROVIDER_SITE_OTHER): Payer: Medicare Other | Admitting: Cardiovascular Disease

## 2016-10-07 VITALS — BP 139/70 | HR 98 | Ht 65.0 in | Wt 169.0 lb

## 2016-10-07 DIAGNOSIS — I5042 Chronic combined systolic (congestive) and diastolic (congestive) heart failure: Secondary | ICD-10-CM

## 2016-10-07 DIAGNOSIS — Z95810 Presence of automatic (implantable) cardiac defibrillator: Secondary | ICD-10-CM | POA: Diagnosis not present

## 2016-10-07 DIAGNOSIS — I739 Peripheral vascular disease, unspecified: Secondary | ICD-10-CM

## 2016-10-07 DIAGNOSIS — I2510 Atherosclerotic heart disease of native coronary artery without angina pectoris: Secondary | ICD-10-CM | POA: Diagnosis not present

## 2016-10-07 DIAGNOSIS — J441 Chronic obstructive pulmonary disease with (acute) exacerbation: Secondary | ICD-10-CM | POA: Diagnosis not present

## 2016-10-07 DIAGNOSIS — E7800 Pure hypercholesterolemia, unspecified: Secondary | ICD-10-CM

## 2016-10-07 NOTE — Patient Instructions (Signed)
Dr Sallyanne Kuster recommends that you schedule a follow-up appointment for the first week of AUGUST 2018 with a defibrillator check.  If you need a refill on your cardiac medications before your next appointment, please call your pharmacy.

## 2016-10-07 NOTE — Progress Notes (Signed)
Patient ID: AUBERY DATE, female   DOB: 1935/08/29, 81 y.o.   MRN: [id number]    Cardiology Office Note    Date:  10/08/2016   ID:  Sydney Phillips, Sydney Phillips May 15, 1936, MRN [id number]  PCP:  Alysia Penna, MD  Cardiologist:   [name], MD   Chief Complaint  Patient presents with  . Follow-up    History of Present Illness:  Sydney Phillips is a 81 y.o. female with cardiomyopathy here for CHF follow up, check on CRT-D device, Recent hospitalizations in November 2017, December 2017 and January 2018.   She has mixed ischemic and nonischemic CMP and has been CRT "hyper-responder". In 2009 her ejection fraction was 20%. After CRT, her LV improved and in 2013 EF was 50% and she had several years of relative cardiac stability. Over the last year she has had numerous severe clinical events.  She received drug-eluting stents to the right coronary artery and left circumflex coronary artery on 05/20/2016. She is currently receiving dual antiplatelet therapy with aspirin and clopidogrel. She has not had angina and denies any bleeding complications.  She is planning surgery oral surgical procedures, sequential bilateral cataract surgery followed by several dental extractions. She is smoking again.  It seems we've achieved some degree of stability with her volume status. It remains very difficult to get in touch with her, despite the fact that we are receiving downloads from her defibrillator to the heart failure device clinic. Her weight has been consistently 168-170 pounds on her home scale.  Her oldest son has moved into her home, since his home was damaged by the tornado. One of her younger sons, TIMI, has medical power of attorney.  LVEF in September 2017 was 40-45%, thendropped to 25-30% in November 2017 when she had NSTEMI, in the setting of COPD exacerbation, respiratory failure, acute renal failure and syncope. Cardiac catheterization showed severe stenoses in the right coronary artery  and left circumflex coronary artery both treated with drug-eluting stents. There was also moderate ostial stenosis of the LAD. Enrolled in [facility] study. Repeat echo in January 2018 showed EF 20-25% with a severely dilated left ventricle, moderate mitral regurgitation, systolic PA pressure 53 mmHg. Was readmitted on December 15 with altered mental status, suspicion of steroid induced psychosis and discharged to skilled nursing facility, but again seen in the emergency room on December 19 after a fall with head injury. Gradually improved mental status and felt to be back at baseline by early January. After that hospitalization for diuretics were held for acute renal insufficiency and she was admitted on January 11 with acute exacerbation of heart failure. Improved with diuretics and discharged on January 15 with a weight of 77.4 kg (171 lb).   CRT-D check shows normal findings as far as the battery/leads are concerned and excellent (98.9%) bi-V pacing. She does not require atrial pacing. She is extremely sedentary, activity 0.3 hours/day. Battery voltage 2.65 V (ERI at 2.63 V) Her Optivol has been extremely volatile, but is reaching a semblance of stability over the last few weeks.  She denies angina pectoris, leg edema, worsening shortness of breath, palpitations or syncope. Thankfully she has not had any recent serious falls   Past Medical History:  Diagnosis Date  . Arthritis   . Automatic implantable cardioverter-defibrillator in situ   . Bronchitis   . CAD (coronary artery disease)    a. s/p DES to LCx/RCA 05/2016, ostial LAD disease.  . Cardiomyopathy ([place])   . Chronic back pain   .  Chronic combined systolic and diastolic CHF (congestive heart failure) ([place])   . Chronic constipation   . Chronic pain   . CKD (chronic kidney disease), stage III   . Colon polyps 2003.  2015.   HP polyps 2003.  adnomas 2015.  required referal to [facility] for colonoscopic resection of flat polyps.   Marland Kitchen COPD  (chronic obstructive pulmonary disease) ([place])   . Dementia   . Depression with anxiety    takes Cymbalta daily  . Diabetes mellitus ([place])   . Early cataracts, bilateral   . Fibromyalgia   . GERD (gastroesophageal reflux disease)    was on meds but was taken off;now watches what she eats  . Hemorrhoids   . History of kidney stones   . Hx of colonic polyps   . Hyperlipemia    takes Crestor daily  . Hypertension    takes Amlodipine and Metoprolol daily  . Insomnia   . LBBB (left bundle branch block)    Stress test 09/03/2010, EF 55  . Myocardial infarction ([place])   . PAD (peripheral artery disease) ([facility])    Carotid, subclavian, and lower extremity beds, currently not symptomatic  . Presence of combination internal cardiac defibrillator (ICD) and pacemaker   . Presence of permanent cardiac pacemaker   . Pulmonary hypertension ([place])   . S/P angioplasty with stent, lt. subclavian 07/31/11 08/01/2011  . Subclavian arterial stenosis, lt, with PTA/STENT 07/31/11 08/01/2011  . Syncope 07/28/2011   EF - 50-55, moderate concentric hypertrophy in left ventricle  . Urinary incontinence   . Vertigo    takes Meclizine prn    Past Surgical History:  Procedure Laterality Date  . ABDOMINAL AORTAGRAM N/A 08/12/2013   Procedure: ABDOMINAL Maxcine Ham;  Surgeon: Elam Dutch, MD;  Location: [facility] CATH LAB;  Service: Cardiovascular;  Laterality: N/A;  . ABDOMINAL HYSTERECTOMY    . APPENDECTOMY    . BACK SURGERY  2012  . BIV ICD GENERTAOR CHANGE OUT Left 02/20/2012   Procedure: BIV ICD GENERTAOR CHANGE OUT;  Surgeon: [name], MD;  Location: [facility] CATH LAB;  Service: Cardiovascular;  Laterality: Left;  . CARDIAC CATHETERIZATION  12/01/2007   By Dr. Melvern Banker, left heart cath,   . CARDIAC CATHETERIZATION N/A 05/20/2016   Procedure: Right/Left Heart Cath and Coronary Angiography;  Surgeon: Sherren Mocha, MD;  Location: [place] CV LAB;  Service: Cardiovascular;  Laterality: N/A;  . CARDIAC  CATHETERIZATION N/A 05/20/2016   Procedure: Coronary Stent Intervention;  Surgeon: Sherren Mocha, MD;  Location: [place] CV LAB;  Service: Cardiovascular;  Laterality: N/A;  . CARDIAC DEFIBRILLATOR PLACEMENT  05/2008   By Dr Blanch Media, [employer] CANNOT HAVE MRI's  . CAROTID ANGIOGRAM N/A 07/31/2011   Procedure: CAROTID ANGIOGRAM;  Surgeon: Lorretta Harp, MD;  Location: [facility] CATH LAB;  Service: Cardiovascular;  Laterality: N/A;  carotid angiogram and possible Lt SCA PTA  . COLONOSCOPY W/ POLYPECTOMY  12/2013  . CORONARY ANGIOPLASTY    . ENDARTERECTOMY Left 11/08/2014   Procedure: LEFT CAROTID ENDARTERECTOMY WITH HEMASHIELD PATCH ANGIOPLASTY;  Surgeon: Elam Dutch, MD;  Location: [place];  Service: Vascular;  Laterality: Left;  . ESOPHAGOGASTRODUODENOSCOPY N/A 01/20/2014   Procedure: ESOPHAGOGASTRODUODENOSCOPY (EGD);  Surgeon: Jerene Bears, MD;  Location: [facility] ENDOSCOPY;  Service: Endoscopy;  Laterality: N/A;  . FEMORAL-POPLITEAL BYPASS GRAFT Right 10/12/2013   Procedure:   Femoral-Peroneal trunk  bypass with nonreversed greater saphenous vein graft;  Surgeon: Elam Dutch, MD;  Location: [place];  Service: Vascular;  Laterality: Right;  .  GIVENS CAPSULE STUDY N/A 01/20/2014   Procedure: GIVENS CAPSULE STUDY;  Surgeon: Jerene Bears, MD;  Location: [place];  Service: Gastroenterology;  Laterality: N/A;  . INTRAOPERATIVE ARTERIOGRAM Right 10/12/2013   Procedure: INTRA OPERATIVE ARTERIOGRAM;  Surgeon: Elam Dutch, MD;  Location: [place];  Service: Vascular;  Laterality: Right;  . ORIF ELBOW FRACTURE  08/16/2011   Procedure: OPEN REDUCTION INTERNAL FIXATION (ORIF) ELBOW/OLECRANON FRACTURE;  Surgeon: Schuyler Amor, MD;  Location: [place];  Service: Orthopedics;  Laterality: Left;  . RENAL ANGIOGRAM N/A 08/12/2013   Procedure: RENAL ANGIOGRAM;  Surgeon: Elam Dutch, MD;  Location: [facility] CATH LAB;  Service: Cardiovascular;  Laterality: N/A;  . SUBCLAVIAN STENT PLACEMENT Left 07/31/2011   7x18  Genesis, balloon, with reduction of 90% ostial left subclavian artery stenosis to 0% with residual excellent flow  . TONSILLECTOMY    . TUBAL LIGATION      Current Medications: Outpatient Medications Prior to Visit  Medication Sig Dispense Refill  . albuterol (PROVENTIL HFA;VENTOLIN HFA) 108 (90 Base) MCG/ACT inhaler Inhale 2 puffs into the lungs every 4 (four) hours as needed for wheezing or shortness of breath. 1 Inhaler 5  . allopurinol (ZYLOPRIM) 300 MG tablet Take 1 tablet (300 mg total) by mouth daily. 30 tablet 11  . ALPRAZolam (XANAX) 1 MG tablet Take 1 tablet (1 mg total) by mouth every 6 (six) hours as needed for anxiety. (Patient taking differently: Take 2 mg by mouth at bedtime. ) 120 tablet 5  . aspirin 81 MG chewable tablet Chew 1 tablet (81 mg total) by mouth daily. 30 tablet 1  . atorvastatin (LIPITOR) 40 MG tablet Take 1 tablet (40 mg total) by mouth daily at 6 PM. 90 tablet 3  . bisoprolol (ZEBETA) 5 MG tablet Take 1 tablet (5 mg total) by mouth daily. 90 tablet 3  . clopidogrel (PLAVIX) 75 MG tablet Take 1 tablet (75 mg total) by mouth daily. 60 tablet 11  . DULoxetine (CYMBALTA) 60 MG capsule Take 60 mg by mouth 2 (two) times daily.    . fluticasone (FLONASE) 50 MCG/ACT nasal spray Place 1 spray into both nostrils daily as needed for allergies.     . furosemide (LASIX) 80 MG tablet Take 1-1.5 tablets (80-120 mg total) by mouth as directed. 45 tablet 11  . HYDROcodone-acetaminophen (NORCO) 10-325 MG tablet Take one tablet by mouth every 6 hours as needed for pain. DNE 3gm of APAP from all sources/24hours 120 tablet 0  . lisinopril (PRINIVIL,ZESTRIL) 5 MG tablet Take 1 tablet (5 mg total) by mouth daily. 90 tablet 3  . nitroGLYCERIN (NITROSTAT) 0.4 MG SL tablet Place 0.4 mg under the tongue every 5 (five) minutes as needed for chest pain. X 3 doses    . potassium chloride SA (K-DUR,KLOR-CON) 20 MEQ tablet Take 1 tablet (20 mEq total) by mouth 2 (two) times daily. 180 tablet 3    . traZODone (DESYREL) 50 MG tablet Take 1 tablet (50 mg total) by mouth at bedtime. 30 tablet 5   No facility-administered medications prior to visit.      Allergies:   Potassium-containing compounds; Azithromycin; Codeine; Darvon; Erythromycin; Meloxicam; Norco [hydrocodone-acetaminophen]; Penicillins; Propoxyphene n-acetaminophen; Rofecoxib; Rosuvastatin; Statins; and Sulfa antibiotics   Social History   Social History  . Marital status: Widowed    Spouse name: N/A  . Number of children: 3  . Years of education: 12   Occupational History  . Retired    Social History Main Topics  .  Smoking status: Current Every Day Smoker    Packs/day: 0.50    Years: 62.00    Types: Cigarettes  . Smokeless tobacco: Never Used     Comment: Down to 3-4 cigarettes per day  . Alcohol use No  . Drug use: No  . Sexual activity: No   Other Topics Concern  . None   Social History Narrative   Ok to share information with medical POA, Son Gabriel Carina   Right-handed   Caffeine: Pepsi     Family History:  The patient's family history includes CAD in her father; Cancer in her sister; Colon cancer in her maternal grandmother; Deep vein thrombosis in her son; Diabetes in her sister; Heart disease in her father, mother, and sister; Hyperlipidemia in her father.   ROS:   Please see the history of present illness.    ROS All other systems reviewed and are negative.   PHYSICAL EXAM:   VS:  BP 139/70 (BP Location: Right Arm, Patient Position: Sitting, Cuff Size: Normal)   Pulse 98   Ht 5\' 5"  (1.651 m)   Wt 76.7 kg (169 lb)   SpO2 99%   BMI 28.12 kg/m    GEN: Well nourished, well developed, in no acute distress  HEENT: normal  Neck: no JVD, carotid bruits, or masses Cardiac: Paradoxically split  S2, RRR; no murmurs, rubs, or gallops,no edema  Respiratory:  clear to auscultation bilaterally, normal work of breathing GI: soft, nontender, nondistended, + BS MS: no deformity or atrophy  Skin:  warm and dry, no rash Neuro:  Alert and Oriented x 3, Strength and sensation are intact Psych: euthymic mood, full affect  Wt Readings from Last 3 Encounters:  10/07/16 76.7 kg (169 lb)  09/25/16 79.8 kg (176 lb)  09/09/16 78.9 kg (174 lb)      Studies/Labs Reviewed:   EKG:  EKG is ordered today.  The ekg ordered today demonstrates Atrial sensed ventricular paced rhythm. Although there is no prominent R wave in lead V1, the QRS is remarkably narrow. Patient carries 112 ms.  Recent Labs: 05/14/2016: TSH 1.013 08/07/2016: Brain Natriuretic Peptide 540.2; Magnesium 1.9 09/25/2016: ALT 16; BUN 19; Creatinine, Ser 1.09; Hemoglobin 13.2; Platelets 329.0; Potassium 3.9; Sodium 143   Lipid Panel    Component Value Date/Time   CHOL 193 05/15/2016 0248   TRIG 116 05/15/2016 0248   HDL 25 (L) 05/15/2016 0248   CHOLHDL 7.7 05/15/2016 0248   VLDL 23 05/15/2016 0248   LDLCALC 145 (H) 05/15/2016 0248   LDLDIRECT 152.0 05/30/2015 1452     ASSESSMENT:    1. Chronic combined systolic (congestive) and diastolic (congestive) heart failure ([place])   2. CAD in native artery   3. PAD (peripheral artery disease) ([place])   4. Biventricular implantable cardioverter-defibrillator in situ   5. Hypercholesterolemia   6. COPD exacerbation ([place])      PLAN:  In order of problems listed above:  1. CHF: She has a very narrow margin of compensation. Seems to finally be reaching a steady state based on both clinical data and her Optivol recordings. Optimal "dry weight" appears to be 168-170 pounds. Most recent EF around 25%, has not improved despite revascularization in December. She is on ACE inhibitor recent a highly selective beta blocker. Would like to avoid increasing these medications since I'm concerned about her tendency to hypotension and falls.. Try to keep at 170 lb or less on home scale. She is to call us if she gains more than  3 pounds in one night or 5 pounds in 1 week 2. CAD s/p DES to RCA and  LCX Dec 2017: on ASA and clopidogrel. Cautioned that she should not interrupt the antiplatelets at least for 6 months following her procedure (that is until 11/18/2016). If her dentist requests that she stop clopidogrel, tooth extraction should be delayed until after June 5. Could make a good argument for lifelong aggressive antiplatelet therapy based on her anatomy, but note that she hada lot of problems with GI bleeding a couple of years ago. We'll see how things go over the next few months. 3. PAD: History of left subclavian stent, should monitor blood pressure in the right arm. History of left carotid endarterectomy, followed by Dr. Oneida Alar. Asymptomatic decrease in left lower extremity ABI. 4. HLP: Has unsuccessfully tried to take statins in the past. High risk of progression of disease unless intervene and bring her LDL down to target under 70. If she proves to be compliant with follow-up will pursue PCS K9 inhibitors. Will bring this up at her next office appointment in 3 months. 5. CRT-D: normal device function, good CRT efficiency. Enrolled in ICM clinic, but it is often difficult to get a hold of her by phone. 6. COPD: Strongly encouraged to quit smoking, but she has no intention to start that process at this time. Warned her about the risks of serious accidents since she has home oxygen.    Medication Adjustments/Labs and Tests Ordered: Current medicines are reviewed at length with the patient today.  Concerns regarding medicines are outlined above.  Medication changes, Labs and Tests ordered today are listed in the Patient Instructions below. Patient Instructions  Dr Sallyanne Kuster recommends that you schedule a follow-up appointment for the first week of AUGUST 2018 with a defibrillator check.  If you need a refill on your cardiac medications before your next appointment, please call your pharmacy.    Signed, [name], MD  10/08/2016 3:09 PM    [address], [place], [place]  [address] Phone: [phone]; Fax: [phone]

## 2016-10-10 DIAGNOSIS — J449 Chronic obstructive pulmonary disease, unspecified: Secondary | ICD-10-CM | POA: Diagnosis not present

## 2016-10-15 ENCOUNTER — Telehealth: Payer: Self-pay | Admitting: Cardiovascular Disease

## 2016-10-15 NOTE — Telephone Encounter (Deleted)
New Message  [place] From [facility] call requesting to speak with RN about a surgical

## 2016-10-15 NOTE — Telephone Encounter (Signed)
Will fax this note to the number provided. 

## 2016-10-15 NOTE — Telephone Encounter (Signed)
New Message  Shela verbalized that she is calling for [facility]  She is sending a fax [phone]

## 2016-10-15 NOTE — Telephone Encounter (Signed)
She just saw M. Croitoru, MD on 4/24 and was doing well.  She may proceed with cataract surgery provided that she is able to continue both aspirin and plavix throughout the perioperative period.  She may not stop these medications.  She does not require any additional ischemic evaluation prior to surgery.  Murray Hodgkins, NP

## 2016-10-15 NOTE — Telephone Encounter (Signed)
Follow Up   Calling again because pt is having surgery tomorrow requests call backk asap see previous note

## 2016-10-16 ENCOUNTER — Telehealth: Payer: Self-pay | Admitting: Cardiovascular Disease

## 2016-10-16 DIAGNOSIS — H25811 Combined forms of age-related cataract, right eye: Secondary | ICD-10-CM | POA: Diagnosis not present

## 2016-10-16 DIAGNOSIS — H2511 Age-related nuclear cataract, right eye: Secondary | ICD-10-CM | POA: Diagnosis not present

## 2016-10-16 NOTE — Telephone Encounter (Signed)
New message     [phone] fax , Please fax authorization asap , pt is having the cataract surgery today ,    Thank you

## 2016-10-16 NOTE — Telephone Encounter (Signed)
Faxed telephone note dated 10-15-16-chris Sharolyn Douglas to [facility]

## 2016-10-20 ENCOUNTER — Telehealth: Payer: Self-pay

## 2016-10-20 ENCOUNTER — Ambulatory Visit (INDEPENDENT_AMBULATORY_CARE_PROVIDER_SITE_OTHER): Payer: Self-pay

## 2016-10-20 DIAGNOSIS — Z95810 Presence of automatic (implantable) cardiac defibrillator: Secondary | ICD-10-CM

## 2016-10-20 DIAGNOSIS — I5042 Chronic combined systolic (congestive) and diastolic (congestive) heart failure: Secondary | ICD-10-CM

## 2016-10-20 NOTE — Telephone Encounter (Signed)
Remote ICM transmission received.  Attempted patient call and left detailed message regarding transmission and next ICM scheduled for 11/20/2016.  Advised to return call for any fluid symptoms or questions.

## 2016-10-20 NOTE — Progress Notes (Signed)
EPIC Encounter for ICM Monitoring  Patient Name: Sydney Phillips is a 81 y.o. female Date: 10/20/2016 Primary Care Physican: Laurey Morale, MD Primary Cardiologist:Croitoru Electrophysiologist: Croitoru DryWeight: unknown Bi-V Pacing: 98.5%              Attempted call to patient and unable to reach.  Left detailed message regarding transmission.  Transmission reviewed.    Thoracic impedance abnormal suggesting fluid accumulation but has significantly improved and trending just under baseline.  Current prescribed dose of Furosemide 80 mg alternating with120 mg every other day.  Potassium 20 mEq 1 tablet twice a day.   Labs: 09/25/2016 Creatinine 1.09, BUN 34, Potassium 3.9,  Sodium 143  09/04/2016 Creatinine 0.98, BUN 13, Potassium 3.3, Sodium 141 08/08/2016 Creatinine 1.05, BUN 11, Potassium 4.1Sodium 144, BNP 540.2 07/09/2016 Creatinine 1.19, BUN 19, Potassium 4.4, Sodium 140 06/30/2016 Creatinine 1.15, BUN 27, Potassium 4.6, Sodium 139, EGFR 44-51 01/14/2018Creatinine 1.12, BUN 26, Potassium 4.3, Sodium 140, EGFR 46-53  01/13/2018Creatinine 1.01, BUN 26, Potassium 3.7, Sodium 141, EGFR 52->60  06/27/2016 Creatinine 1.06, BUN 14, Potassium 3.7, Sodium 140, EGFR 49-57  01/11/2018Creatinine 0.86, BUN 6, Potassium 3.4, Sodium 142, EGFR >60  01/05/2018Creatinine 0.83, BUN 7, Potassium 3.8, Sodium 144  05/31/2016 Creatinine 1.16, BUN 19, Potassium 3.8, Sodium 143, EGFR 44-51  05/30/2016 Creatinine 1.48, BUN 26, Potassium 3.3, Sodium 137, EGFR 33-38  05/29/2016 Creatinine 1.48, BUN 27, Potassium 3.6, Sodium 140  05/23/2016 Creatinine 1.21, BUN 27, Potassium 3.4, Sodium 140, EGFR 42-48  12/07/2017Creatinine 1.27, BUN 24, Potassium 3.6, Sodium 141, EGFR 39-46 05/21/2016 Creatinine 1.42, BUN 32, Potassium 4.0, Sodium 143, EGFR 34-40  05/20/2016 Creatinine 1.27, EGFR 39-46  05/20/2016 Creatinine 1.45, BUN 43, Potassium 4.1, Sodium 139, EGFR 34-39  05/19/2016 Creatinine  1.46, BUN 49, Potassium 3.9, Sodium 140, EGFR 33-39  Recommendations: Left voice mail with ICM number and encouraged to call for fluid symptoms.  Follow-up plan: ICM clinic phone appointment on 11/20/2016.  Office appointment scheduled on 02/06/2017 with Dr Sallyanne Kuster.  Copy of ICM check sent to primary cardiologist and device physician.   3 month ICM trend: 10/20/2016   1 Year ICM trend:      [name], RN 10/20/2016 9:17 AM

## 2016-10-24 ENCOUNTER — Ambulatory Visit: Payer: Medicare Other | Admitting: Cardiovascular Disease

## 2016-10-30 ENCOUNTER — Telehealth: Payer: Self-pay | Admitting: Cardiovascular Disease

## 2016-10-30 MED ORDER — MAGNESIUM OXIDE 400 MG PO CAPS: 400.0000 mg | ORAL_CAPSULE | Freq: Two times a day (BID) | ORAL | 3 refills | Status: DC

## 2016-10-30 NOTE — Addendum Note (Signed)
Addended by: Theodore Demark on: 10/30/2016 01:31 PM   Modules accepted: Orders

## 2016-10-30 NOTE — Telephone Encounter (Signed)
Returned call to communicate instruction. Pt voiced agreement/understanding - she's been on this med before, and was not sure who discontinued or why, but that it was taken off her list, possibly after most recent hospital visit. She still has bottle on-hand - I confirmed same dose - she will start back on this today. Aware to continue to monitor her symptoms, not if any improvement over the next few days - if she doesn't feel she is getting better, may call back for further instruction.  Pt very appreciative of call and assistance.

## 2016-10-30 NOTE — Telephone Encounter (Signed)
Pt of Dr. Sallyanne Kuster  Initial complaint reported to operator doesn't fully capture pt's concerns. Pt voiced that she has a longstanding concern that her potassium tablets are causing her constipation. She takes miralax to address this, daily, but reports "it doesn't do anything" and she was impacted for about 2 weeks before finally taking additional laxative last night.  She denies taking Norco/hydrocodone (which is on her list) - I informed her this med can aggravate constipation/bowel impaction, so if not taking, continue to avoid.  She had a large BM this morning which she voiced was runny/watery. Was not able to get to commode before it began, but able to get cleaned up/shower w minimal difficulty.   Pt asking about fluid replenishment bc she feels so wiped out from the ordeal. She asked about drinking gatorade - I advised no d/t salt content.  Pt voiced concern about sensation of urinary urgency, dribbling, discomfort on commode - advised to contact PCP as she may need to have a UC obtained.  She voiced that her weight was up at 174 before BM, 171 lbs afterward. States Dr. Sallyanne Kuster wants her weight to be less than 170. I assured her that if she'd not had a BM in 2 weeks, this was likely contributing to the weight increase. She voiced no concerns for leg swelling, exertional dyspnea, though she does state she is sleeping propped w 2 pillows now, instead of 1. I advised to see how things progress today. Did not recommend extra lasix, advised her to take all meds as scheduled.  Pt voiced understanding of all instructions. She's aware I'm routing to Dr. Sallyanne Kuster for plan going forward.

## 2016-10-30 NOTE — Telephone Encounter (Signed)
Try magnesium oxide 400 mg twice daily. This will help with constipation (it is a mild laxative) and helps replete the Mag she loses due to diuretics. MCr

## 2016-10-30 NOTE — Telephone Encounter (Signed)
New Message   Pt voiced her weight went over 3Ibs in one day which was yesterday. Pt voiced she did not have a bowel movement in two weeks. Took laxative last night and woke up with feces all over here.   Pt voiced wanting to know what she needs to do.

## 2016-11-07 ENCOUNTER — Telehealth: Payer: Self-pay | Admitting: Family Medicine

## 2016-11-07 MED ORDER — HYDROCODONE-ACETAMINOPHEN 10-325 MG PO TABS: ORAL_TABLET | ORAL | 0 refills | Status: DC

## 2016-11-07 NOTE — Telephone Encounter (Signed)
Script is ready for pick up here at front office and I spoke with pt.  

## 2016-11-07 NOTE — Telephone Encounter (Signed)
done

## 2016-11-07 NOTE — Telephone Encounter (Signed)
Pt needs new hydrocodone °

## 2016-11-09 DIAGNOSIS — J449 Chronic obstructive pulmonary disease, unspecified: Secondary | ICD-10-CM | POA: Diagnosis not present

## 2016-11-11 LAB — CUP PACEART INCLINIC DEVICE CHECK
Brady Statistic AP VS Percent: 0.04 %
Brady Statistic AS VP Percent: 98 %
Brady Statistic RA Percent Paced: 0.97 %
Brady Statistic RV Percent Paced: 98.18 %
HIGH POWER IMPEDANCE MEASURED VALUE: 68 Ohm
HighPow Impedance: 56 Ohm
HighPow Impedance: 589 Ohm
Implantable Lead Implant Date: 20091202
Implantable Lead Implant Date: 20091202
Implantable Lead Location: 753859
Implantable Lead Model: 4194
Implantable Lead Model: 4470
Implantable Lead Serial Number: 308680
Implantable Lead Serial Number: 635533
Implantable Pulse Generator Implant Date: 20130906
Lead Channel Impedance Value: 266 Ohm
Lead Channel Impedance Value: 494 Ohm
Lead Channel Pacing Threshold Amplitude: 0.625 V
Lead Channel Pacing Threshold Pulse Width: 0.4 ms
Lead Channel Pacing Threshold Pulse Width: 1 ms
Lead Channel Sensing Intrinsic Amplitude: 15 mV
Lead Channel Sensing Intrinsic Amplitude: 2.75 mV
Lead Channel Setting Pacing Amplitude: 1.5 V
Lead Channel Setting Pacing Amplitude: 2.5 V
Lead Channel Setting Pacing Pulse Width: 1 ms
MDC IDC LEAD IMPLANT DT: 20091202
MDC IDC LEAD LOCATION: 753858
MDC IDC LEAD LOCATION: 753860
MDC IDC MSMT BATTERY VOLTAGE: 2.65 V
MDC IDC MSMT LEADCHNL LV IMPEDANCE VALUE: 380 Ohm
MDC IDC MSMT LEADCHNL LV IMPEDANCE VALUE: 513 Ohm
MDC IDC MSMT LEADCHNL LV PACING THRESHOLD AMPLITUDE: 1.875 V
MDC IDC MSMT LEADCHNL RA PACING THRESHOLD AMPLITUDE: 0.5 V
MDC IDC MSMT LEADCHNL RA PACING THRESHOLD PULSEWIDTH: 0.4 ms
MDC IDC MSMT LEADCHNL RA SENSING INTR AMPL: 2.75 mV
MDC IDC MSMT LEADCHNL RV IMPEDANCE VALUE: 589 Ohm
MDC IDC MSMT LEADCHNL RV SENSING INTR AMPL: 15 mV
MDC IDC SESS DTM: 20180424193159
MDC IDC SET LEADCHNL RV PACING AMPLITUDE: 2 V
MDC IDC SET LEADCHNL RV PACING PULSEWIDTH: 0.4 ms
MDC IDC SET LEADCHNL RV SENSING SENSITIVITY: 0.3 mV
MDC IDC STAT BRADY AP VP PERCENT: 0.94 %
MDC IDC STAT BRADY AS VS PERCENT: 1.02 %

## 2016-11-13 ENCOUNTER — Other Ambulatory Visit: Payer: Self-pay | Admitting: Family Medicine

## 2016-11-14 NOTE — Telephone Encounter (Signed)
Can we refill this? 

## 2016-11-18 ENCOUNTER — Telehealth: Payer: Self-pay | Admitting: Family Medicine

## 2016-11-18 NOTE — Telephone Encounter (Signed)
Pt dropped off form on 11/17/16.  Filled out and sent to Dr. Sarajane Jews to review and sign.

## 2016-11-20 ENCOUNTER — Telehealth: Payer: Self-pay

## 2016-11-20 ENCOUNTER — Ambulatory Visit (INDEPENDENT_AMBULATORY_CARE_PROVIDER_SITE_OTHER): Payer: Medicare Other

## 2016-11-20 DIAGNOSIS — I5042 Chronic combined systolic (congestive) and diastolic (congestive) heart failure: Secondary | ICD-10-CM

## 2016-11-20 DIAGNOSIS — Z95810 Presence of automatic (implantable) cardiac defibrillator: Secondary | ICD-10-CM

## 2016-11-20 NOTE — Telephone Encounter (Signed)
Remote ICM transmission received.  Attempted patient call and left detailed message regarding transmission and next ICM scheduled for 12/23/2016.  Advised to return call for any fluid symptoms or questions.

## 2016-11-20 NOTE — Progress Notes (Signed)
EPIC Encounter for ICM Monitoring  Patient Name: Sydney Phillips is a 81 y.o. female Date: 11/20/2016 Primary Care Physican: Laurey Morale, MD Primary Cardiologist:Croitoru Electrophysiologist: Croitoru DryWeight: unknown Bi-V Pacing: 97.1%      Attempted call to patient and unable to reach.  Left detailed message regarding transmission.  Transmission reviewed.    Thoracic impedance has returned to baeline today, 6/7 but has been abnormal suggesting fluid accumulation since 04/2016 with exception of few days at baseline.  Fluid index remains >threshold  Current prescribed dose of Furosemide 80 mg alternating with120 mg every other day.  Potassium 20 mEq 1 tablet twice a day.   Labs: 09/25/2016 Creatinine 1.09, BUN 34, Potassium 3.9, Sodium 143  09/04/2016 Creatinine 0.98, BUN 13, Potassium 3.3, Sodium 141 08/08/2016 Creatinine 1.05, BUN 11, Potassium 4.1Sodium 144, BNP 540.2 07/09/2016 Creatinine 1.19, BUN 19, Potassium 4.4, Sodium 140 06/30/2016 Creatinine 1.15, BUN 27, Potassium 4.6, Sodium 139, EGFR 44-51 01/14/2018Creatinine 1.12, BUN 26, Potassium 4.3, Sodium 140, EGFR 46-53  01/13/2018Creatinine 1.01, BUN 26, Potassium 3.7, Sodium 141, EGFR 52->60  06/27/2016 Creatinine 1.06, BUN 14, Potassium 3.7, Sodium 140, EGFR 49-57  01/11/2018Creatinine 0.86, BUN 6, Potassium 3.4, Sodium 142, EGFR >60  01/05/2018Creatinine 0.83, BUN 7, Potassium 3.8, Sodium 144  05/31/2016 Creatinine 1.16, BUN 19, Potassium 3.8, Sodium 143, EGFR 44-51  05/30/2016 Creatinine 1.48, BUN 26, Potassium 3.3, Sodium 137, EGFR 33-38  05/29/2016 Creatinine 1.48, BUN 27, Potassium 3.6, Sodium 140  05/23/2016 Creatinine 1.21, BUN 27, Potassium 3.4, Sodium 140, EGFR 42-48  12/07/2017Creatinine 1.27, BUN 24, Potassium 3.6, Sodium 141, EGFR 39-46 05/21/2016 Creatinine 1.42, BUN 32, Potassium 4.0, Sodium 143, EGFR 34-40  05/20/2016 Creatinine 1.27, EGFR 39-46   Recommendations: Left voice mail  with ICM number and encouraged to call for fluid symptoms.  Follow-up plan: ICM clinic phone appointment on 12/23/2016.  Office appointment scheduled on 02/06/2017 with Dr. Sallyanne Kuster.  Copy of ICM check sent to cardiologist/device physician.   3 month ICM trend: 11/20/2016   1 Year ICM trend:      [name], RN 11/20/2016 9:47 AM

## 2016-11-20 NOTE — Telephone Encounter (Signed)
Called the pt.  Notified that she is still asleep.  Asked that she return my call.  Paper work is ready for pick up.  Copy sent to scan and a copy retained for my records.

## 2016-11-24 NOTE — Telephone Encounter (Signed)
Pt called back.  Sydney Phillips advised her to come and pick up her paper work.

## 2016-11-24 NOTE — Telephone Encounter (Signed)
Left a message for a return call.

## 2016-12-10 DIAGNOSIS — J449 Chronic obstructive pulmonary disease, unspecified: Secondary | ICD-10-CM | POA: Diagnosis not present

## 2016-12-17 ENCOUNTER — Encounter (HOSPITAL_COMMUNITY): Payer: Self-pay | Admitting: Emergency Medicine

## 2016-12-17 ENCOUNTER — Emergency Department (HOSPITAL_COMMUNITY): Payer: Medicare Other

## 2016-12-17 ENCOUNTER — Inpatient Hospital Stay (HOSPITAL_COMMUNITY)
Admission: EM | Admit: 2016-12-17 | Discharge: 2016-12-20 | DRG: 291 | Disposition: A | Discharge status: Home / Self Care | Payer: Medicare Other | Attending: Internal Medicine | Admitting: Internal Medicine

## 2016-12-17 DIAGNOSIS — Z955 Presence of coronary angioplasty implant and graft: Secondary | ICD-10-CM

## 2016-12-17 DIAGNOSIS — K219 Gastro-esophageal reflux disease without esophagitis: Secondary | ICD-10-CM | POA: Diagnosis present

## 2016-12-17 DIAGNOSIS — Z8249 Family history of ischemic heart disease and other diseases of the circulatory system: Secondary | ICD-10-CM

## 2016-12-17 DIAGNOSIS — J9621 Acute and chronic respiratory failure with hypoxia: Secondary | ICD-10-CM | POA: Diagnosis not present

## 2016-12-17 DIAGNOSIS — I5043 Acute on chronic combined systolic (congestive) and diastolic (congestive) heart failure: Secondary | ICD-10-CM | POA: Diagnosis present

## 2016-12-17 DIAGNOSIS — N179 Acute kidney failure, unspecified: Secondary | ICD-10-CM | POA: Diagnosis not present

## 2016-12-17 DIAGNOSIS — I130 Hypertensive heart and chronic kidney disease with heart failure and stage 1 through stage 4 chronic kidney disease, or unspecified chronic kidney disease: Principal | ICD-10-CM | POA: Diagnosis present

## 2016-12-17 DIAGNOSIS — I429 Cardiomyopathy, unspecified: Secondary | ICD-10-CM | POA: Diagnosis not present

## 2016-12-17 DIAGNOSIS — I509 Heart failure, unspecified: Secondary | ICD-10-CM

## 2016-12-17 DIAGNOSIS — T502X5A Adverse effect of carbonic-anhydrase inhibitors, benzothiadiazides and other diuretics, initial encounter: Secondary | ICD-10-CM | POA: Diagnosis not present

## 2016-12-17 DIAGNOSIS — R0602 Shortness of breath: Secondary | ICD-10-CM | POA: Diagnosis not present

## 2016-12-17 DIAGNOSIS — I2510 Atherosclerotic heart disease of native coronary artery without angina pectoris: Secondary | ICD-10-CM | POA: Diagnosis not present

## 2016-12-17 DIAGNOSIS — E1151 Type 2 diabetes mellitus with diabetic peripheral angiopathy without gangrene: Secondary | ICD-10-CM | POA: Diagnosis present

## 2016-12-17 DIAGNOSIS — M797 Fibromyalgia: Secondary | ICD-10-CM | POA: Diagnosis not present

## 2016-12-17 DIAGNOSIS — E785 Hyperlipidemia, unspecified: Secondary | ICD-10-CM | POA: Diagnosis not present

## 2016-12-17 DIAGNOSIS — E876 Hypokalemia: Secondary | ICD-10-CM | POA: Diagnosis not present

## 2016-12-17 DIAGNOSIS — N183 Chronic kidney disease, stage 3 unspecified: Secondary | ICD-10-CM

## 2016-12-17 DIAGNOSIS — I5021 Acute systolic (congestive) heart failure: Secondary | ICD-10-CM | POA: Diagnosis not present

## 2016-12-17 DIAGNOSIS — Z7951 Long term (current) use of inhaled steroids: Secondary | ICD-10-CM

## 2016-12-17 DIAGNOSIS — E1122 Type 2 diabetes mellitus with diabetic chronic kidney disease: Secondary | ICD-10-CM | POA: Diagnosis not present

## 2016-12-17 DIAGNOSIS — Z7982 Long term (current) use of aspirin: Secondary | ICD-10-CM

## 2016-12-17 DIAGNOSIS — F17210 Nicotine dependence, cigarettes, uncomplicated: Secondary | ICD-10-CM | POA: Diagnosis present

## 2016-12-17 DIAGNOSIS — Z9981 Dependence on supplemental oxygen: Secondary | ICD-10-CM

## 2016-12-17 DIAGNOSIS — Z95810 Presence of automatic (implantable) cardiac defibrillator: Secondary | ICD-10-CM | POA: Diagnosis present

## 2016-12-17 DIAGNOSIS — F0390 Unspecified dementia without behavioral disturbance: Secondary | ICD-10-CM | POA: Diagnosis present

## 2016-12-17 DIAGNOSIS — Z833 Family history of diabetes mellitus: Secondary | ICD-10-CM | POA: Diagnosis not present

## 2016-12-17 DIAGNOSIS — Z7902 Long term (current) use of antithrombotics/antiplatelets: Secondary | ICD-10-CM

## 2016-12-17 DIAGNOSIS — Z720 Tobacco use: Secondary | ICD-10-CM

## 2016-12-17 DIAGNOSIS — I252 Old myocardial infarction: Secondary | ICD-10-CM | POA: Diagnosis not present

## 2016-12-17 DIAGNOSIS — J438 Other emphysema: Secondary | ICD-10-CM

## 2016-12-17 DIAGNOSIS — Z66 Do not resuscitate: Secondary | ICD-10-CM | POA: Diagnosis not present

## 2016-12-17 DIAGNOSIS — I10 Essential (primary) hypertension: Secondary | ICD-10-CM | POA: Diagnosis not present

## 2016-12-17 DIAGNOSIS — J449 Chronic obstructive pulmonary disease, unspecified: Secondary | ICD-10-CM | POA: Diagnosis present

## 2016-12-17 DIAGNOSIS — Z90710 Acquired absence of both cervix and uterus: Secondary | ICD-10-CM

## 2016-12-17 LAB — GLUCOSE, CAPILLARY
GLUCOSE-CAPILLARY: 185 mg/dL — AB (ref 65–99)
GLUCOSE-CAPILLARY: 264 mg/dL — AB (ref 65–99)

## 2016-12-17 LAB — TROPONIN I
Troponin I: <0.03 ng/mL (ref <0.03)
Troponin I: <0.03 ng/mL (ref <0.03)

## 2016-12-17 LAB — URINALYSIS, ROUTINE W REFLEX MICROSCOPIC
Bilirubin Urine: NEGATIVE
Glucose, UA: NEGATIVE mg/dL
HGB URINE DIPSTICK: NEGATIVE
Ketones, ur: NEGATIVE mg/dL
LEUKOCYTES UA: NEGATIVE
NITRITE: NEGATIVE
PROTEIN: NEGATIVE mg/dL
Specific Gravity, Urine: 1.008 (ref 1.005–1.030)
pH: 6 (ref 5.0–8.0)

## 2016-12-17 LAB — BASIC METABOLIC PANEL
ANION GAP: 11 (ref 5–15)
ANION GAP: 12 (ref 5–15)
BUN: 10 mg/dL (ref 6–20)
BUN: 9 mg/dL (ref 6–20)
CALCIUM: 9 mg/dL (ref 8.9–10.3)
CHLORIDE: 100 mmol/L — AB (ref 101–111)
CHLORIDE: 101 mmol/L (ref 101–111)
CO2: 28 mmol/L (ref 22–32)
CO2: 28 mmol/L (ref 22–32)
Calcium: 8.9 mg/dL (ref 8.9–10.3)
Creatinine, Ser: 1 mg/dL (ref 0.44–1.00)
Creatinine, Ser: 1.09 mg/dL — ABNORMAL HIGH (ref 0.44–1.00)
GFR calc non Af Amer: 53 mL/min — ABNORMAL LOW (ref >60)
GFR calc non Af Amer: 47 mL/min — ABNORMAL LOW (ref >60)
GFR, EST AFRICAN AMERICAN: 55 mL/min — AB (ref >60)
GLUCOSE: 188 mg/dL — AB (ref 65–99)
Glucose, Bld: 209 mg/dL — ABNORMAL HIGH (ref 65–99)
POTASSIUM: 2.4 mmol/L — AB (ref 3.5–5.1)
Potassium: 2.7 mmol/L — CL (ref 3.5–5.1)
SODIUM: 141 mmol/L (ref 135–145)
Sodium: 139 mmol/L (ref 135–145)

## 2016-12-17 LAB — CBC
HEMATOCRIT: 45 % (ref 36.0–46.0)
HEMOGLOBIN: 13.7 g/dL (ref 12.0–15.0)
MCH: 28 pg (ref 26.0–34.0)
MCHC: 30.4 g/dL (ref 30.0–36.0)
MCV: 92 fL (ref 78.0–100.0)
Platelets: 239 10*3/uL (ref 150–400)
RBC: 4.89 MIL/uL (ref 3.87–5.11)
RDW: 19.8 % — ABNORMAL HIGH (ref 11.5–15.5)
WBC: 12.6 10*3/uL — ABNORMAL HIGH (ref 4.0–10.5)

## 2016-12-17 LAB — I-STAT TROPONIN, ED: Troponin i, poc: 0.02 ng/mL (ref 0.00–0.08)

## 2016-12-17 LAB — BRAIN NATRIURETIC PEPTIDE: B Natriuretic Peptide: 1060.6 pg/mL — ABNORMAL HIGH (ref 0.0–100.0)

## 2016-12-17 LAB — I-STAT CG4 LACTIC ACID, ED: Lactic Acid, Venous: 1.92 mmol/L (ref 0.5–1.9)

## 2016-12-17 MED ORDER — ASPIRIN 81 MG PO CHEW
81.0000 mg | CHEWABLE_TABLET | Freq: Every day | ORAL | Status: DC
  Administered 2016-12-17 – 2016-12-20 (×4): 81 mg via ORAL
  Filled 2016-12-17 (×4): qty 1

## 2016-12-17 MED ORDER — ALLOPURINOL 300 MG PO TABS
300.0000 mg | ORAL_TABLET | Freq: Every day | ORAL | Status: DC
  Administered 2016-12-17 – 2016-12-20 (×4): 300 mg via ORAL
  Filled 2016-12-17 (×4): qty 1

## 2016-12-17 MED ORDER — ATORVASTATIN CALCIUM 40 MG PO TABS
40.0000 mg | ORAL_TABLET | Freq: Every day | ORAL | Status: DC
  Administered 2016-12-17 – 2016-12-19 (×3): 40 mg via ORAL
  Filled 2016-12-17 (×3): qty 1

## 2016-12-17 MED ORDER — BISOPROLOL FUMARATE 5 MG PO TABS
5.0000 mg | ORAL_TABLET | Freq: Every day | ORAL | Status: DC
  Administered 2016-12-17 – 2016-12-20 (×4): 5 mg via ORAL
  Filled 2016-12-17 (×4): qty 1

## 2016-12-17 MED ORDER — ONDANSETRON HCL 4 MG PO TABS: 4.0000 mg | ORAL_TABLET | Freq: Four times a day (QID) | ORAL | Status: DC | PRN

## 2016-12-17 MED ORDER — POTASSIUM CHLORIDE 10 MEQ/100ML IV SOLN
10.0000 meq | Freq: Once | INTRAVENOUS | Status: AC
  Administered 2016-12-17: 10 meq via INTRAVENOUS
  Filled 2016-12-17: qty 100

## 2016-12-17 MED ORDER — SODIUM CHLORIDE 0.9% FLUSH
3.0000 mL | Freq: Two times a day (BID) | INTRAVENOUS | Status: DC
  Administered 2016-12-17 – 2016-12-19 (×6): 3 mL via INTRAVENOUS

## 2016-12-17 MED ORDER — POTASSIUM CHLORIDE 10 MEQ/100ML IV SOLN
10.0000 meq | INTRAVENOUS | Status: AC
  Administered 2016-12-17 (×3): 10 meq via INTRAVENOUS
  Filled 2016-12-17 (×4): qty 100

## 2016-12-17 MED ORDER — FUROSEMIDE 10 MG/ML IJ SOLN
80.0000 mg | Freq: Two times a day (BID) | INTRAMUSCULAR | Status: DC
  Administered 2016-12-18 – 2016-12-19 (×2): 80 mg via INTRAVENOUS
  Filled 2016-12-17 (×3): qty 8

## 2016-12-17 MED ORDER — NITROGLYCERIN 0.4 MG SL SUBL
0.4000 mg | SUBLINGUAL_TABLET | SUBLINGUAL | Status: DC | PRN
  Administered 2016-12-17: 0.4 mg via SUBLINGUAL
  Filled 2016-12-17: qty 1

## 2016-12-17 MED ORDER — HYDROCODONE-ACETAMINOPHEN 10-325 MG PO TABS
1.0000 | ORAL_TABLET | Freq: Four times a day (QID) | ORAL | Status: DC | PRN
  Administered 2016-12-19: 1 via ORAL
  Filled 2016-12-17: qty 1

## 2016-12-17 MED ORDER — ONDANSETRON HCL 4 MG/2ML IJ SOLN: 4.0000 mg | Freq: Four times a day (QID) | INTRAMUSCULAR | Status: DC | PRN

## 2016-12-17 MED ORDER — POTASSIUM CHLORIDE CRYS ER 20 MEQ PO TBCR
40.0000 meq | EXTENDED_RELEASE_TABLET | Freq: Once | ORAL | Status: DC
  Filled 2016-12-17: qty 2

## 2016-12-17 MED ORDER — ACETAMINOPHEN 650 MG RE SUPP: 650.0000 mg | Freq: Four times a day (QID) | RECTAL | Status: DC | PRN

## 2016-12-17 MED ORDER — LISINOPRIL 10 MG PO TABS
5.0000 mg | ORAL_TABLET | Freq: Every day | ORAL | Status: DC
  Administered 2016-12-17 – 2016-12-18 (×2): 5 mg via ORAL
  Filled 2016-12-17 (×2): qty 1

## 2016-12-17 MED ORDER — ALPRAZOLAM 0.5 MG PO TABS
1.0000 mg | ORAL_TABLET | Freq: Every day | ORAL | Status: DC
  Administered 2016-12-17 – 2016-12-19 (×3): 1 mg via ORAL
  Filled 2016-12-17 (×3): qty 2

## 2016-12-17 MED ORDER — ENOXAPARIN SODIUM 40 MG/0.4ML ~~LOC~~ SOLN
40.0000 mg | SUBCUTANEOUS | Status: DC
  Administered 2016-12-17 – 2016-12-19 (×3): 40 mg via SUBCUTANEOUS
  Filled 2016-12-17 (×3): qty 0.4

## 2016-12-17 MED ORDER — INSULIN ASPART 100 UNIT/ML ~~LOC~~ SOLN
0.0000 [IU] | Freq: Three times a day (TID) | SUBCUTANEOUS | Status: DC
Start: 2016-12-17 — End: 2016-12-20
  Administered 2016-12-17 – 2016-12-18 (×2): 2 [IU] via SUBCUTANEOUS
  Administered 2016-12-18: 1 [IU] via SUBCUTANEOUS
  Administered 2016-12-19: 2 [IU] via SUBCUTANEOUS
  Administered 2016-12-19: 1 [IU] via SUBCUTANEOUS

## 2016-12-17 MED ORDER — IPRATROPIUM-ALBUTEROL 0.5-2.5 (3) MG/3ML IN SOLN
3.0000 mL | RESPIRATORY_TRACT | Status: DC | PRN
  Administered 2016-12-18 – 2016-12-19 (×2): 3 mL via RESPIRATORY_TRACT
  Filled 2016-12-17 (×2): qty 3

## 2016-12-17 MED ORDER — TRAZODONE HCL 50 MG PO TABS
50.0000 mg | ORAL_TABLET | Freq: Every day | ORAL | Status: DC
  Administered 2016-12-17 – 2016-12-19 (×3): 50 mg via ORAL
  Filled 2016-12-17 (×3): qty 1

## 2016-12-17 MED ORDER — ACETAMINOPHEN 325 MG PO TABS
650.0000 mg | ORAL_TABLET | Freq: Four times a day (QID) | ORAL | Status: DC | PRN
  Administered 2016-12-19 (×2): 650 mg via ORAL
  Filled 2016-12-17 (×2): qty 2

## 2016-12-17 MED ORDER — CLOPIDOGREL BISULFATE 75 MG PO TABS
75.0000 mg | ORAL_TABLET | Freq: Every day | ORAL | Status: DC
  Administered 2016-12-17 – 2016-12-20 (×4): 75 mg via ORAL
  Filled 2016-12-17 (×4): qty 1

## 2016-12-17 MED ORDER — MAGNESIUM OXIDE 400 (241.3 MG) MG PO TABS
400.0000 mg | ORAL_TABLET | Freq: Two times a day (BID) | ORAL | Status: DC
  Administered 2016-12-17 – 2016-12-20 (×6): 400 mg via ORAL
  Filled 2016-12-17 (×6): qty 1

## 2016-12-17 MED ORDER — DULOXETINE HCL 60 MG PO CPEP
60.0000 mg | ORAL_CAPSULE | Freq: Two times a day (BID) | ORAL | Status: DC
  Administered 2016-12-17 – 2016-12-20 (×6): 60 mg via ORAL
  Filled 2016-12-17 (×6): qty 1

## 2016-12-17 MED ORDER — POTASSIUM CHLORIDE CRYS ER 20 MEQ PO TBCR
40.0000 meq | EXTENDED_RELEASE_TABLET | Freq: Two times a day (BID) | ORAL | Status: DC
  Administered 2016-12-17 – 2016-12-18 (×4): 40 meq via ORAL
  Filled 2016-12-17 (×4): qty 2

## 2016-12-17 MED ORDER — FUROSEMIDE 10 MG/ML IJ SOLN
80.0000 mg | Freq: Once | INTRAMUSCULAR | Status: AC
  Administered 2016-12-17: 80 mg via INTRAVENOUS
  Filled 2016-12-17: qty 8

## 2016-12-17 NOTE — ED Notes (Signed)
Attempted report 

## 2016-12-17 NOTE — Progress Notes (Signed)
Pt stable on [place] with no noticeable WOB.  Pt talking in full complete sentences.  No BiPAP indicated at this time.

## 2016-12-17 NOTE — ED Notes (Signed)
ED PA and Admitiing MD Eulas Post aware patient has not had output after IV  Lasix

## 2016-12-17 NOTE — ED Triage Notes (Signed)
Patient present today from home with complaints of Shortness of breathe x 2 days. Patient reports becoming increasingly worst. Patient reports she has notice she has be gaining weight. Patient denies any pain. Patient denies any cough. Patient alert and oriented x4.

## 2016-12-17 NOTE — Progress Notes (Addendum)
Spoke to Dr. Eulas Post and updated her that Pt has rested well this afternoon since admission.  Sats have been 97-99% on 40% bipap.  Resp 16-20.  Pt is alert and oriented x 4 and able to carry on a conversation without becoming sob and has no difficulty swallowing meds whole.  Bipap removed and 5l [place] applied.  Also 45f foley catheter inserted for acute urinary retention and diuresing. Dr. Eulas Post made aware of K+ level at 2.4.  Orders received.

## 2016-12-17 NOTE — Progress Notes (Signed)
Pt placed on bipap per MD order. Pt Spo2 decreased on initiation but returned to probable baseline (89%). Pt current smoker with COPD. No complications. RN at bedside. RT will continue to monitor

## 2016-12-17 NOTE — ED Notes (Signed)
Patient given 10 of albuterol with EMS .4 atrovent and 125mg  solumedrol.

## 2016-12-17 NOTE — H&P (Signed)
History and Physical    LANAI Sydney Phillips:[id number] DOB: 02-12-1936 DOA: 12/17/2016  PCP: Laurey Morale, MD   Patient coming from: Home  Chief Complaint: Shortness of breath  HPI: Sydney Phillips is a 81 y.o. woman with multiple comorbidities including CAD S/P two stens, chronic combined CHF (EF 20-25% per last echo in July 2018), CKD 3, COPD with chronic hypoxic respiratory failure (she is on 2L [place] at home), "borderline" Type 2 Diabetes, HTN, HLD, PAD S/P fempop bypass to right leg, and AICD/PPM who presents to the ED for evaluation of chest tightness, shortness of breath, and 10lb weight gain since Monday.  No lower extremity swelling.  No nausea, vomiting, or syncope.  She has had intermittent wheezing.  She is an active smoker.  She took an extra dose of lasix yesterday, which did not help with her symptoms.  She checks her weight daily.  She typically wears home oxygen 2L [place]; she has increased to 4L [place] over the past two days.  ED Course: The patient received albuterol and atrovent nebs as well as solumedrol 125mg  IV from EMS.  Upon arrival to the ED, she was placed on BiPAP for O2 sats in the upper 80's and tachypnea.  BNP 1060.  Troponin 0.02.  Potassium 2.7.  WBC 12.6.  She has received lasix 80mg  IV, potassium chloride supplementation, and one SL NTG.  Hospitalist asked to admit.  Review of Systems: As per HPI otherwise 10 systems reviewed and negative.   Past Medical History:  Diagnosis Date  . Arthritis   . Automatic implantable cardioverter-defibrillator in situ   . Bronchitis   . CAD (coronary artery disease)    a. s/p DES to LCx/RCA 05/2016, ostial LAD disease.  . Cardiomyopathy ([place])   . Chronic back pain   . Chronic combined systolic and diastolic CHF (congestive heart failure) ([place])   . Chronic constipation   . Chronic pain   . CKD (chronic kidney disease), stage III   . Colon polyps 2003.  2015.   HP polyps 2003.  adnomas 2015.  required referal to [facility]  for colonoscopic resection of flat polyps.   Marland Kitchen COPD (chronic obstructive pulmonary disease) ([place])   . Dementia   . Depression with anxiety    takes Cymbalta daily  . Diabetes mellitus ([place])   . Early cataracts, bilateral   . Fibromyalgia   . GERD (gastroesophageal reflux disease)    was on meds but was taken off;now watches what she eats  . Hemorrhoids   . History of kidney stones   . Hx of colonic polyps   . Hyperlipemia    takes Crestor daily  . Hypertension    takes Amlodipine and Metoprolol daily  . Insomnia   . LBBB (left bundle branch block)    Stress test 09/03/2010, EF 55  . Myocardial infarction ([place])   . PAD (peripheral artery disease) ([facility])    Carotid, subclavian, and lower extremity beds, currently not symptomatic  . Presence of combination internal cardiac defibrillator (ICD) and pacemaker   . Presence of permanent cardiac pacemaker   . Pulmonary hypertension ([place])   . S/P angioplasty with stent, lt. subclavian 07/31/11 08/01/2011  . Subclavian arterial stenosis, lt, with PTA/STENT 07/31/11 08/01/2011  . Syncope 07/28/2011   EF - 50-55, moderate concentric hypertrophy in left ventricle  . Urinary incontinence   . Vertigo    takes Meclizine prn    Past Surgical History:  Procedure Laterality Date  . ABDOMINAL AORTAGRAM  N/A 08/12/2013   Procedure: ABDOMINAL Maxcine Ham;  Surgeon: Elam Dutch, MD;  Location: [facility] CATH LAB;  Service: Cardiovascular;  Laterality: N/A;  . ABDOMINAL HYSTERECTOMY    . APPENDECTOMY    . BACK SURGERY  2012  . BIV ICD GENERTAOR CHANGE OUT Left 02/20/2012   Procedure: BIV ICD GENERTAOR CHANGE OUT;  Surgeon: [name], MD;  Location: [facility] CATH LAB;  Service: Cardiovascular;  Laterality: Left;  . CARDIAC CATHETERIZATION  12/01/2007   By Dr. Melvern Banker, left heart cath,   . CARDIAC CATHETERIZATION N/A 05/20/2016   Procedure: Right/Left Heart Cath and Coronary Angiography;  Surgeon: Sherren Mocha, MD;  Location: [place] CV LAB;  Service:  Cardiovascular;  Laterality: N/A;  . CARDIAC CATHETERIZATION N/A 05/20/2016   Procedure: Coronary Stent Intervention;  Surgeon: Sherren Mocha, MD;  Location: [place] CV LAB;  Service: Cardiovascular;  Laterality: N/A;  . CARDIAC DEFIBRILLATOR PLACEMENT  05/2008   By Dr Blanch Media, [employer] CANNOT HAVE MRI's  . CAROTID ANGIOGRAM N/A 07/31/2011   Procedure: CAROTID ANGIOGRAM;  Surgeon: Lorretta Harp, MD;  Location: [facility] CATH LAB;  Service: Cardiovascular;  Laterality: N/A;  carotid angiogram and possible Lt SCA PTA  . COLONOSCOPY W/ POLYPECTOMY  12/2013  . CORONARY ANGIOPLASTY    . ENDARTERECTOMY Left 11/08/2014   Procedure: LEFT CAROTID ENDARTERECTOMY WITH HEMASHIELD PATCH ANGIOPLASTY;  Surgeon: Elam Dutch, MD;  Location: [place] [facility];  Service: Vascular;  Laterality: Left;  . ESOPHAGOGASTRODUODENOSCOPY N/A 01/20/2014   Procedure: ESOPHAGOGASTRODUODENOSCOPY (EGD);  Surgeon: Jerene Bears, MD;  Location: [facility] ENDOSCOPY;  Service: Endoscopy;  Laterality: N/A;  . FEMORAL-POPLITEAL BYPASS GRAFT Right 10/12/2013   Procedure:   Femoral-Peroneal trunk  bypass with nonreversed greater saphenous vein graft;  Surgeon: Elam Dutch, MD;  Location: [facility];  Service: Vascular;  Laterality: Right;  . GIVENS CAPSULE STUDY N/A 01/20/2014   Procedure: GIVENS CAPSULE STUDY;  Surgeon: Jerene Bears, MD;  Location: [place];  Service: Gastroenterology;  Laterality: N/A;  . INTRAOPERATIVE ARTERIOGRAM Right 10/12/2013   Procedure: INTRA OPERATIVE ARTERIOGRAM;  Surgeon: Elam Dutch, MD;  Location: [place];  Service: Vascular;  Laterality: Right;  . ORIF ELBOW FRACTURE  08/16/2011   Procedure: OPEN REDUCTION INTERNAL FIXATION (ORIF) ELBOW/OLECRANON FRACTURE;  Surgeon: Schuyler Amor, MD;  Location: [place];  Service: Orthopedics;  Laterality: Left;  . RENAL ANGIOGRAM N/A 08/12/2013   Procedure: RENAL ANGIOGRAM;  Surgeon: Elam Dutch, MD;  Location: [facility] CATH LAB;  Service: Cardiovascular;  Laterality: N/A;  .  SUBCLAVIAN STENT PLACEMENT Left 07/31/2011   7x18 [place], balloon, with reduction of 90% ostial left subclavian artery stenosis to 0% with residual excellent flow  . TONSILLECTOMY    . TUBAL LIGATION       reports that she has been smoking Cigarettes.  She has a 31.00 pack-year smoking history. She has never used smokeless tobacco. She reports that she does not drink alcohol or use drugs.  She currently smokes 1ppd.  No EtOH or illicit drug use.  She is a widow.  Her son now lives with her and takes care of her.  Allergies  Allergen Reactions  . Potassium-Containing Compounds Other (See Comments)    Causes severe constipation  . Azithromycin Rash  . Codeine Itching  . Darvon Itching  . Erythromycin Rash  . Meloxicam Other (See Comments)    Unknown    . Norco [Hydrocodone-Acetaminophen] Itching  . Penicillins Rash and Other (See Comments)    Has patient had a PCN  reaction causing immediate rash, facial/tongue/throat swelling, SOB or lightheadedness with hypotension: unknown Has patient had a PCN reaction causing severe rash involving mucus membranes or skin necrosis: no Has patient had a PCN reaction that required hospitalization: unknown Has patient had a PCN reaction occurring within the last 10 years: no If all of the above answers are "NO", then may proceed with Cephalosporin use.   Marland Kitchen Propoxyphene N-Acetaminophen Itching  . Rofecoxib Other (See Comments)    Unknown    . Rosuvastatin Other (See Comments)    cramps  . Statins Itching and Other (See Comments)    Sleeplessness  . Sulfa Antibiotics Rash    Family History  Problem Relation Age of Onset  . CAD Father   . Heart disease Father   . Hyperlipidemia Father   . Heart disease Mother   . Deep vein thrombosis Son   . Hyperlipidemia Unknown   . Colon cancer Maternal Grandmother   . Cancer Sister        ovarian  . Diabetes Sister   . Heart disease Sister   . Anesthesia problems Neg Hx   . Hypotension Neg Hx   .  Malignant hyperthermia Neg Hx   . Pseudochol deficiency Neg Hx      Prior to Admission medications   Medication Sig Start Date End Date Taking? Authorizing Provider  albuterol (PROVENTIL HFA;VENTOLIN HFA) 108 (90 Base) MCG/ACT inhaler Inhale 2 puffs into the lungs every 4 (four) hours as needed for wheezing or shortness of breath. 10/12/15  Yes Laurey Morale, MD  allopurinol (ZYLOPRIM) 300 MG tablet TAKE ONE TABLET BY MOUTH ONCE DAILY 11/14/16  Yes Laurey Morale, MD  ALPRAZolam Duanne Moron) 1 MG tablet Take 1 tablet (1 mg total) by mouth every 6 (six) hours as needed for anxiety. Patient taking differently: Take 1 mg by mouth at bedtime.  07/30/16  Yes Laurey Morale, MD  aspirin 81 MG chewable tablet Chew 1 tablet (81 mg total) by mouth daily. 05/23/16  Yes Reyne Dumas, MD  atorvastatin (LIPITOR) 40 MG tablet Take 1 tablet (40 mg total) by mouth daily at 6 PM. 06/20/16  Yes Laurey Morale, MD  bisoprolol (ZEBETA) 5 MG tablet Take 1 tablet (5 mg total) by mouth daily. 06/20/16  Yes Laurey Morale, MD  clopidogrel (PLAVIX) 75 MG tablet Take 1 tablet (75 mg total) by mouth daily. 07/30/16  Yes Laurey Morale, MD  DULoxetine (CYMBALTA) 60 MG capsule Take 60 mg by mouth 2 (two) times daily.   Yes [provider]  fluticasone (FLONASE) 50 MCG/ACT nasal spray Place 1 spray into both nostrils daily as needed for allergies.    Yes [provider]  furosemide (LASIX) 80 MG tablet Take 1-1.5 tablets (80-120 mg total) by mouth as directed. 07/25/16  Yes Croitoru, Mihai, MD  HYDROcodone-acetaminophen (NORCO) 10-325 MG tablet Take one tablet by mouth every 6 hours as needed for pain. DNE 3gm of APAP from all sources/24hours 11/07/16  Yes Laurey Morale, MD  lisinopril (PRINIVIL,ZESTRIL) 5 MG tablet Take 1 tablet (5 mg total) by mouth daily. 09/09/16  Yes Laurey Morale, MD  Magnesium Oxide 400 MG CAPS Take 1 capsule (400 mg total) by mouth 2 (two) times daily. 10/30/16  Yes Croitoru, Mihai, MD    nitroGLYCERIN (NITROSTAT) 0.4 MG SL tablet Place 0.4 mg under the tongue every 5 (five) minutes as needed for chest pain. X 3 doses   Yes [provider]  potassium chloride SA (  K-DUR,KLOR-CON) 20 MEQ tablet Take 1 tablet (20 mEq total) by mouth 2 (two) times daily. 09/25/16  Yes Laurey Morale, MD  traZODone (DESYREL) 50 MG tablet Take 1 tablet (50 mg total) by mouth at bedtime. 09/09/16  Yes Laurey Morale, MD    Physical Exam: Vitals:   12/17/16 1130 12/17/16 1215 12/17/16 1229 12/17/16 1230  BP: (!) 144/62 (!) 146/53 (!) 146/53 138/65  Pulse: 83 78 80 77  Resp: (!) 24 19 (!) 23 20  Temp:      TempSrc:      SpO2: 95% 94% 94% 96%      Constitutional: NAD, calm, tolerating BiPAP Vitals:   12/17/16 1130 12/17/16 1215 12/17/16 1229 12/17/16 1230  BP: (!) 144/62 (!) 146/53 (!) 146/53 138/65  Pulse: 83 78 80 77  Resp: (!) 24 19 (!) 23 20  Temp:      TempSrc:      SpO2: 95% 94% 94% 96%   Eyes: PERRL, lids and conjunctivae normal ENMT: Deferred because she is on BiPAP Neck: normal appearance, supple, no masses Respiratory: clear to auscultation bilaterally, no wheezing, no crackles. Normal respiratory effort. No accessory muscle use.  Cardiovascular: Normal rate, regular rhythm, + murmur.  No rubs / gallops. No extremity edema. 2+ pedal pulses.  GI: abdomen is soft and compressible.  No distention.  No tenderness.  Bowel sounds are present. Musculoskeletal:  No joint deformity in upper and lower extremities. Moves all four extremities spontaneously, no contractures. Normal muscle tone.  Skin: no rashes, pale, cool, dry Neurologic: No apparent focal deficits. Psychiatric: Normal judgment and insight. Alert and oriented x 3. Normal mood.     Labs on Admission: I have personally reviewed following labs and imaging studies  CBC:  Recent Labs Lab 12/17/16 1030  WBC 12.6*  HGB 13.7  HCT 45.0  MCV 92.0  PLT 024   Basic Metabolic Panel:  Recent Labs Lab  12/17/16 1030  NA 139  K 2.7*  CL 100*  CO2 28  GLUCOSE 188*  BUN 10  CREATININE 1.00  CALCIUM 9.0   GFR: CrCl cannot be calculated (Unknown ideal weight.).  Sepsis Labs:  Lactic acid level 1.92  Radiological Exams on Admission: Dg Chest Port 1 View  Result Date: 12/17/2016 CLINICAL DATA:  Shortness of breath 2 days becoming worse. Weight gain. EXAM: PORTABLE CHEST 1 VIEW COMPARISON:  09/04/2016 FINDINGS: Left-sided pacemaker unchanged. Lungs are adequately inflated with mild prominence of the perihilar markings likely mild vascular congestion. No focal airspace consolidation. No definite effusion. Moderate stable cardiomegaly. Calcified granuloma over the left midlung. Degenerative change of the spine. Minimal calcified plaque over the aortic arch. IMPRESSION: Stable moderate cardiomegaly with suggestion mild vascular congestion. Electronically Signed   By: Marin Olp M.D.   On: 12/17/2016 10:44    EKG: Independently reviewed. V paced.  Assessment/Plan Principal Problem:   Acute on chronic combined systolic and diastolic CHF (congestive heart failure) (HCC) Active Problems:   Essential hypertension   COPD (chronic obstructive pulmonary disease) with emphysema (HCC)   Hypokalemia   Biventricular implantable cardioverter-defibrillator in situ   Diabetes mellitus (HCC)   CHF (congestive heart failure) (HCC)   Tobacco abuse      Acute on chronic CHF with acute on chronic respiratory failure --Diuresis with IV lasix --Strict I/O --Daily weights --Serial troponin --Defer repeat echo for now --BMP daily --Potassium 15mEq BID --Wean from BiPAP as tolerated; on 2L [place] at baseline  Hypokalemia --Replacement ordered  Borderline Type  2 DM --SSI coverage with meals  HLD --Statin  HTN --Bisoprolol, lisinopril  COPD --No wheezing on my exam.  Hold on additional steroids for now. --Duobnebs prn     DVT prophylaxis: Lovenox Code Status: DNR Family  Communication: Son/POA present in the ED at time of admission. Disposition Plan: To be determined. Consults called: NONE Admission status: Place in observation, telemetry monitoring.   TIME SPENT: 60 minutes   Eber Jones MD [facility] Pager [phone]  If 7PM-7AM, please contact night-coverage [email or web address] Password [email or web address]  12/17/2016, 12:43 PM

## 2016-12-17 NOTE — ED Provider Notes (Signed)
[facility] DEPT Provider Note   CSN: [id number] Arrival date & time: 12/17/16  5732     History   Chief Complaint Chief Complaint  Patient presents with  . Shortness of Breath    HPI Sydney Phillips is a 81 y.o. female.  HPI  81 y.o. female with a hx of CAD w/ CRT-D Device, CKD, Dementia, COPD, HLD, presents to the Emergency Department today due to shortness of breath x 2 days. Pt states that "it feels like I can't get any air." Denies chest pain. No abdominal pain. No N/V. No diaphoresis. Noted hx same. No fevers. Noted cough. No congestion or URI symptoms. Pt attempted to take Lasix 100mg  this AM and took Lasix 80mg  yesterday in attempt to get fluid off of her. States it feels like the same. Denies leg swelling. Recent hospitalizations in November 2017, December 2017 and January 2018. She has mixed ischemic and nonischemic CMP and has been CRT "hyper-responder". In 2009 her ejection fraction was 20%. After CRT, her LV improved and in 2013 EF was 50% and she had several years of relative cardiac stability. Over the last year she has had numerous severe clinical events. Most recent EF around 25%, has not improved despite revascularization in December. She has CAD s/p DES to RCA and LCX Dec 2017 on ASA and clopidogrel.  Cardiologist- Dr. Sallyanne Kuster  Echocardiogram 06-27-16 - Left ventricle: The cavity size was severely dilated. Wall   thickness was normal. Systolic function was severely reduced. The   estimated ejection fraction was in the range of 20% to 25%.   Diffuse hypokinesis. Doppler parameters are consistent with both   elevated ventricular end-diastolic filling pressure and elevated   left atrial filling pressure. - Aortic valve: Valve area (VTI): 2.05 cm^2. Valve area (Vmax):   1.93 cm^2. Valve area (Vmean): 1.99 cm^2. - Mitral valve: There was moderate regurgitation. Valve area by   continuity equation (using LVOT flow): 2.19 cm^2. - Left atrium: The atrium was  moderately dilated. - Atrial septum: No defect or patent foramen ovale was identified. - Pulmonary arteries: PA peak pressure: 53 mm Hg (S).  Past Medical History:  Diagnosis Date  . Arthritis   . Automatic implantable cardioverter-defibrillator in situ   . Bronchitis   . CAD (coronary artery disease)    a. s/p DES to LCx/RCA 05/2016, ostial LAD disease.  . Cardiomyopathy ([place])   . Chronic back pain   . Chronic combined systolic and diastolic CHF (congestive heart failure) ([place])   . Chronic constipation   . Chronic pain   . CKD (chronic kidney disease), stage III   . Colon polyps 2003.  2015.   HP polyps 2003.  adnomas 2015.  required referal to [facility] for colonoscopic resection of flat polyps.   Marland Kitchen COPD (chronic obstructive pulmonary disease) ([place])   . Dementia   . Depression with anxiety    takes Cymbalta daily  . Diabetes mellitus ([place])   . Early cataracts, bilateral   . Fibromyalgia   . GERD (gastroesophageal reflux disease)    was on meds but was taken off;now watches what she eats  . Hemorrhoids   . History of kidney stones   . Hx of colonic polyps   . Hyperlipemia    takes Crestor daily  . Hypertension    takes Amlodipine and Metoprolol daily  . Insomnia   . LBBB (left bundle branch block)    Stress test 09/03/2010, EF 55  . Myocardial infarction ([place])   .  PAD (peripheral artery disease) (H[facility])    Carotid, subclavian, and lower extremity beds, currently not symptomatic  . Presence of combination internal cardiac defibrillator (ICD) and pacemaker   . Presence of permanent cardiac pacemaker   . Pulmonary hypertension ([place])   . S/P angioplasty with stent, lt. subclavian 07/31/11 08/01/2011  . Subclavian arterial stenosis, lt, with PTA/STENT 07/31/11 08/01/2011  . Syncope 07/28/2011   EF - 50-55, moderate concentric hypertrophy in left ventricle  . Urinary incontinence   . Vertigo    takes Meclizine prn    Patient Active Problem List   Diagnosis Date Noted  . Acute  on chronic combined systolic and diastolic CHF (congestive heart failure) ([place]) 06/26/2016  . Elevated troponin 06/26/2016  . Acute on chronic systolic heart failure ([place]) 06/26/2016  . 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 Chronic respiratory failure with hypoxia ([place])   . Acute encephalopathy 05/30/2016  . Chronic combined systolic and diastolic CHF (congestive heart failure) ([place]) 05/30/2016  . CKD (chronic kidney disease), stage III 05/30/2016  . Elevated lactic acid level 05/30/2016  . Leukocytosis   . Stenosis of left subclavian artery ([place]) 05/19/2016  . Fall at home   . Somnolence   . OSA and COPD overlap syndrome ([place])   . Cardiomyopathy, ischemic   . Ischemic mitral valve regurgitation   . Pulmonary hypertension ([place])   . Controlled diabetes mellitus type 2 with complications ([place])   . Encephalopathy   . CAD in native artery   . Acute renal failure with acute tubular necrosis superimposed on stage 3 chronic kidney disease ([place])   . COPD exacerbation ([place]) 05/14/2016  . Chronic combined systolic (congestive) and diastolic (congestive) heart failure ([place]) 05/14/2016  . Pleural effusion   . Acute on chronic respiratory failure with hypoxia ([place])   . CHF exacerbation ([place]) 04/06/2016  . Acute respiratory distress 04/06/2016  . Altered mental state 02/26/2016  . Syncopal episodes 02/26/2016  . Acute kidney injury ([place]) 02/26/2016  . Skin lesion of back/upper right shoulder 02/26/2016  . Cardiomyopathy ([place]) 11/07/2015  . Smoking 11/07/2015  . Arthralgia 10/12/2015  . Left carotid stenosis 11/08/2014  . Diabetes mellitus ([place]) 05/02/2014  . NSTEMI (non-ST elevated myocardial infarction) ([place]) 01/20/2014  . Melena 01/20/2014  . Microcytic anemia 01/19/2014  . Chest pain 01/19/2014  . PAD (peripheral artery disease) ([place]) 10/12/2013  . S/P angioplasty with stent, lt. subclavian 07/31/11 08/01/2011  . Biventricular implantable cardioverter-defibrillator in  situ 07/28/2011  . PVD, known severe, (previuosly asymptomatic) LSCA disease, now with "high grade" RICA disease. 07/28/2011  . Bronchitis, recent flare 07/28/2011  . Syncope,possible related to subclavian steal syndrome 07/27/2011  . Vertigo 07/27/2011  . Hypokalemia 07/27/2011  . SPINAL STENOSIS 01/28/2010  . CONSTIPATION 01/14/2010  . BACK PAIN, LUMBAR 01/01/2010  . Insomnia 01/23/2009  . ALLERGIC RHINITIS 11/10/2008  . Coronary atherosclerosis 06/21/2008  . HIP PAIN, BILATERAL 06/21/2008  . Myalgia and myositis 11/26/2007  . Hypercholesterolemia 07/28/2007  . ACUTE SINUSITIS, UNSPECIFIED 07/28/2007  . WEIGHT GAIN 06/04/2007  . DYSPNEA 06/04/2007  . COPD (chronic obstructive pulmonary disease) with emphysema ([place]) 03/02/2007  . Depression with anxiety 02/25/2007  . Essential hypertension 02/25/2007  . GERD 02/25/2007  . COLONIC POLYPS, HX OF 02/25/2007    Past Surgical History:  Procedure Laterality Date  . ABDOMINAL AORTAGRAM N/A 08/12/2013   Procedure: ABDOMINAL Maxcine Ham;  Surgeon: Elam Dutch, MD;  Location: [facility] CATH LAB;  Service: Cardiovascular;  Laterality: N/A;  . ABDOMINAL HYSTERECTOMY    .  APPENDECTOMY    . BACK SURGERY  2012  . BIV ICD GENERTAOR CHANGE OUT Left 02/20/2012   Procedure: BIV ICD GENERTAOR CHANGE OUT;  Surgeon: [name], MD;  Location: [facility] CATH LAB;  Service: Cardiovascular;  Laterality: Left;  . CARDIAC CATHETERIZATION  12/01/2007   By Dr. Melvern Banker, left heart cath,   . CARDIAC CATHETERIZATION N/A 05/20/2016   Procedure: Right/Left Heart Cath and Coronary Angiography;  Surgeon: Sherren Mocha, MD;  Location: [facility] CV LAB;  Service: Cardiovascular;  Laterality: N/A;  . CARDIAC CATHETERIZATION N/A 05/20/2016   Procedure: Coronary Stent Intervention;  Surgeon: Sherren Mocha, MD;  Location: [facility] CV LAB;  Service: Cardiovascular;  Laterality: N/A;  . CARDIAC DEFIBRILLATOR PLACEMENT  05/2008   By Dr Blanch Media, [employer] CANNOT HAVE MRI's  .  CAROTID ANGIOGRAM N/A 07/31/2011   Procedure: CAROTID ANGIOGRAM;  Surgeon: Lorretta Harp, MD;  Location: [facility] CATH LAB;  Service: Cardiovascular;  Laterality: N/A;  carotid angiogram and possible Lt SCA PTA  . COLONOSCOPY W/ POLYPECTOMY  12/2013  . CORONARY ANGIOPLASTY    . ENDARTERECTOMY Left 11/08/2014   Procedure: LEFT CAROTID ENDARTERECTOMY WITH HEMASHIELD PATCH ANGIOPLASTY;  Surgeon: Elam Dutch, MD;  Location: [place];  Service: Vascular;  Laterality: Left;  . ESOPHAGOGASTRODUODENOSCOPY N/A 01/20/2014   Procedure: ESOPHAGOGASTRODUODENOSCOPY (EGD);  Surgeon: Jerene Bears, MD;  Location: [facility] ENDOSCOPY;  Service: Endoscopy;  Laterality: N/A;  . FEMORAL-POPLITEAL BYPASS GRAFT Right 10/12/2013   Procedure:   Femoral-Peroneal trunk  bypass with nonreversed greater saphenous vein graft;  Surgeon: Elam Dutch, MD;  Location: [facility];  Service: Vascular;  Laterality: Right;  . GIVENS CAPSULE STUDY N/A 01/20/2014   Procedure: GIVENS CAPSULE STUDY;  Surgeon: Jerene Bears, MD;  Location: [place] [facility];  Service: Gastroenterology;  Laterality: N/A;  . INTRAOPERATIVE ARTERIOGRAM Right 10/12/2013   Procedure: INTRA OPERATIVE ARTERIOGRAM;  Surgeon: Elam Dutch, MD;  Location: [facility];  Service: Vascular;  Laterality: Right;  . ORIF ELBOW FRACTURE  08/16/2011   Procedure: OPEN REDUCTION INTERNAL FIXATION (ORIF) ELBOW/OLECRANON FRACTURE;  Surgeon: Schuyler Amor, MD;  Location: [facility];  Service: Orthopedics;  Laterality: Left;  . RENAL ANGIOGRAM N/A 08/12/2013   Procedure: RENAL ANGIOGRAM;  Surgeon: Elam Dutch, MD;  Location: [facility] CATH LAB;  Service: Cardiovascular;  Laterality: N/A;  . SUBCLAVIAN STENT PLACEMENT Left 07/31/2011   7x18 [place], balloon, with reduction of 90% ostial left subclavian artery stenosis to 0% with residual excellent flow  . TONSILLECTOMY    . TUBAL LIGATION      OB History    No data available       Home Medications    Prior to Admission medications   Medication Sig  Start Date End Date Taking? Authorizing Provider  albuterol (PROVENTIL HFA;VENTOLIN HFA) 108 (90 Base) MCG/ACT inhaler Inhale 2 puffs into the lungs every 4 (four) hours as needed for wheezing or shortness of breath. 10/12/15   Laurey Morale, MD  allopurinol (ZYLOPRIM) 300 MG tablet TAKE ONE TABLET BY MOUTH ONCE DAILY 11/14/16   Laurey Morale, MD  ALPRAZolam Duanne Moron) 1 MG tablet Take 1 tablet (1 mg total) by mouth every 6 (six) hours as needed for anxiety. Patient taking differently: Take 2 mg by mouth at bedtime.  07/30/16   Laurey Morale, MD  aspirin 81 MG chewable tablet Chew 1 tablet (81 mg total) by mouth daily. 05/23/16   Reyne Dumas, MD  atorvastatin (LIPITOR) 40 MG tablet Take 1 tablet (40 mg  total) by mouth daily at 6 PM. 06/20/16   Laurey Morale, MD  bisoprolol (ZEBETA) 5 MG tablet Take 1 tablet (5 mg total) by mouth daily. 06/20/16   Laurey Morale, MD  clopidogrel (PLAVIX) 75 MG tablet Take 1 tablet (75 mg total) by mouth daily. 07/30/16   Laurey Morale, MD  DULoxetine (CYMBALTA) 60 MG capsule Take 60 mg by mouth 2 (two) times daily.    [provider]  fluticasone (FLONASE) 50 MCG/ACT nasal spray Place 1 spray into both nostrils daily as needed for allergies.     [provider]  furosemide (LASIX) 80 MG tablet Take 1-1.5 tablets (80-120 mg total) by mouth as directed. 07/25/16   Croitoru, Mihai, MD  HYDROcodone-acetaminophen (NORCO) 10-325 MG tablet Take one tablet by mouth every 6 hours as needed for pain. DNE 3gm of APAP from all sources/24hours 11/07/16   Laurey Morale, MD  lisinopril (PRINIVIL,ZESTRIL) 5 MG tablet Take 1 tablet (5 mg total) by mouth daily. 09/09/16   Laurey Morale, MD  Magnesium Oxide 400 MG CAPS Take 1 capsule (400 mg total) by mouth 2 (two) times daily. 10/30/16   Croitoru, Mihai, MD  nitroGLYCERIN (NITROSTAT) 0.4 MG SL tablet Place 0.4 mg under the tongue every 5 (five) minutes as needed for chest pain. X 3 doses    [provider]  potassium  chloride SA (K-DUR,KLOR-CON) 20 MEQ tablet Take 1 tablet (20 mEq total) by mouth 2 (two) times daily. 09/25/16   Laurey Morale, MD  traZODone (DESYREL) 50 MG tablet Take 1 tablet (50 mg total) by mouth at bedtime. 09/09/16   Laurey Morale, MD    Family History Family History  Problem Relation Age of Onset  . CAD Father   . Heart disease Father   . Hyperlipidemia Father   . Heart disease Mother   . Deep vein thrombosis Son   . Hyperlipidemia Unknown   . Colon cancer Maternal Grandmother   . Cancer Sister        ovarian  . Diabetes Sister   . Heart disease Sister   . Anesthesia problems Neg Hx   . Hypotension Neg Hx   . Malignant hyperthermia Neg Hx   . Pseudochol deficiency Neg Hx     Social History Social History  Substance Use Topics  . Smoking status: Current Every Day Smoker    Packs/day: 0.50    Years: 62.00    Types: Cigarettes  . Smokeless tobacco: Never Used     Comment: Down to 3-4 cigarettes per day  . Alcohol use No     Allergies   Potassium-containing compounds; Azithromycin; Codeine; Darvon; Erythromycin; Meloxicam; Norco [hydrocodone-acetaminophen]; Penicillins; Propoxyphene n-acetaminophen; Rofecoxib; Rosuvastatin; Statins; and Sulfa antibiotics   Review of Systems Review of Systems ROS reviewed and all are negative for acute change except as noted in the HPI.  Physical Exam Updated Vital Signs BP (!) 186/71 (BP Location: Right Arm)   Pulse (!) 103   Temp 99.4 F (37.4 C) (Axillary)   Resp (!) 31   SpO2 97%   Physical Exam  Constitutional: She is oriented to person, place, and time. She appears well-developed and well-nourished. No distress.  Active work of breathing  HENT:  Head: Normocephalic and atraumatic.  Right Ear: Hearing, tympanic membrane, external ear and ear canal normal.  Left Ear: Hearing, tympanic membrane, external ear and ear canal normal.  Nose: Nose normal.  Mouth/Throat: Uvula is midline, oropharynx is clear and moist  and  mucous membranes are normal. No trismus in the jaw. No oropharyngeal exudate, posterior oropharyngeal erythema or tonsillar abscesses.  Eyes: Conjunctivae and EOM are normal. Pupils are equal, round, and reactive to light.  Neck: Normal range of motion. Neck supple. No tracheal deviation present.  Cardiovascular: Regular rhythm, S1 normal, S2 normal, normal heart sounds, intact distal pulses and normal pulses.  Tachycardia present.   Pulmonary/Chest: Accessory muscle usage present. Tachypnea noted. No respiratory distress. She has decreased breath sounds in the right upper field, the right lower field, the left upper field and the left lower field. She has no wheezes. She has no rhonchi. She has rales in the right upper field and the left upper field.  Abdominal: Normal appearance and bowel sounds are normal. There is no tenderness.  Musculoskeletal: Normal range of motion.  No pitting edema BLE  Neurological: She is alert and oriented to person, place, and time.  Skin: Skin is warm and dry.  Psychiatric: She has a normal mood and affect. Her speech is normal and behavior is normal. Thought content normal.   ED Treatments / Results  Labs (all labs ordered are listed, but only abnormal results are displayed) Labs Reviewed  BASIC METABOLIC PANEL - Abnormal; Notable for the following:       Result Value   Potassium 2.7 (*)    Chloride 100 (*)    Glucose, Bld 188 (*)    GFR calc non Af Amer 53 (*)    All other components within normal limits  CBC - Abnormal; Notable for the following:    WBC 12.6 (*)    RDW 19.8 (*)    All other components within normal limits  I-STAT CG4 LACTIC ACID, ED - Abnormal; Notable for the following:    Lactic Acid, Venous 1.92 (*)    All other components within normal limits  BRAIN NATRIURETIC PEPTIDE  I-STAT TROPOININ, ED    EKG  EKG Interpretation  Date/Time:  Wednesday December 17 2016 10:01:22 EDT Ventricular Rate:  103 PR Interval:    QRS  Duration: 150 QT Interval:  405 QTC Calculation: 528 R Axis:   -54 Text Interpretation:  V paced rhythm Similar to prior.  Confirmed by Nanda Quinton [phone]) on 12/17/2016 10:03:41 AM      Radiology Dg Chest Port 1 View  Result Date: 12/17/2016 CLINICAL DATA:  Shortness of breath 2 days becoming worse. Weight gain. EXAM: PORTABLE CHEST 1 VIEW COMPARISON:  09/04/2016 FINDINGS: Left-sided pacemaker unchanged. Lungs are adequately inflated with mild prominence of the perihilar markings likely mild vascular congestion. No focal airspace consolidation. No definite effusion. Moderate stable cardiomegaly. Calcified granuloma over the left midlung. Degenerative change of the spine. Minimal calcified plaque over the aortic arch. IMPRESSION: Stable moderate cardiomegaly with suggestion mild vascular congestion. Electronically Signed   By: Marin Olp M.D.   On: 12/17/2016 10:44    Procedures Procedures (including critical care time) CRITICAL CARE Performed by: Ozella Rocks   Total critical care time: 35 minutes  Critical care time was exclusive of separately billable procedures and treating other patients.  Critical care was necessary to treat or prevent imminent or life-threatening deterioration.  Critical care was time spent personally by me on the following activities: development of treatment plan with patient and/or surrogate as well as nursing, discussions with consultants, evaluation of patient's response to treatment, examination of patient, obtaining history from patient or surrogate, ordering and performing treatments and interventions, ordering and review of laboratory studies, ordering  and review of radiographic studies, pulse oximetry and re-evaluation of patient's condition.   Medications Ordered in ED Medications - No data to display   Initial Impression / Assessment and Plan / ED Course  I have reviewed the triage vital signs and the nursing notes.  Pertinent labs & imaging  results that were available during my care of the patient were reviewed by me and considered in my medical decision making (see chart for details).  Final Clinical Impressions(s) / ED Diagnoses  {I have reviewed and evaluated the relevant laboratory values. {I have reviewed and evaluated the relevant imaging studies. {I have interpreted the relevant EKG. {I have reviewed the relevant previous healthcare records. {I have reviewed EMS Documentation. {I obtained HPI from historian. {Patient discussed with supervising physician.  ED Course:  Assessment: Pt is a 81 y.o. female hx of CAD w/ CRT-D Device, CKD, Dementia, COPD, HLD, presents to the Emergency Department today due to shortness of breath x 2 days. Pt states that "it feels like I can't get any air." Denies chest pain. No abdominal pain. No N/V. No diaphoresis. Noted hx same. No fevers. Noted cough. No congestion or URI symptoms. Pt attempted took Lasix 80mg  yesterday in attempt to get fluid off of her. States it feels like the same. Denies leg swelling. Recent hospitalizations in November 2017, December 2017 and January 2018. She has mixed ischemic and nonischemic CMP and has been CRT "hyper-responder". In 2009 her ejection fraction was 20%. After CRT, her LV improved and in 2013 EF was 50% and she had several years of relative cardiac stability. Over the last year she has had numerous severe clinical events. Most recent EF around 25%, has not improved despite revascularization in December. She has CAD s/p DES to RCA and LCX Dec 2017 on ASA and clopidogrel.  On exam, pt in appears actively working for breathing. Nontoxic/nonseptic appearing. VS with slight tachycardia. Hypertensive.O2 Saturation 92% on 2L [place] (baseline O2). Afebrile. Lungs diminished bilaterally. Abdomen nontender soft. No pitting edema. EKG ventricularly paced. No acute abnormalities.  CBC unremarkable. BMP with potassium 2.7. Given IV/PO potassium. Trop negative. BNP 1,060. CXR with  mild vasculature congestion. Cardiomegaly. Given SL NTG, Lasix 80mg  as well as placed on Bipap in ED. Plan is to [place].   10:45 AM- Pt improving on Bipap. BP normalizing. HR decreasing. WOB improved. O2 saturations 96%  Disposition/Plan:  Admit Pt acknowledges and agrees with plan  Supervising Physician Long, Wonda Olds, MD  Final diagnoses:  Acute on chronic congestive heart failure, unspecified heart failure type [facility])    New Prescriptions New Prescriptions   No medications on file       Shary Decamp, Hershal Coria 12/17/16 1143    Long, Wonda Olds, MD 12/18/16 [phone]

## 2016-12-18 ENCOUNTER — Encounter (HOSPITAL_COMMUNITY): Payer: Self-pay

## 2016-12-18 ENCOUNTER — Observation Stay (HOSPITAL_COMMUNITY): Payer: Medicare Other

## 2016-12-18 DIAGNOSIS — I5021 Acute systolic (congestive) heart failure: Secondary | ICD-10-CM

## 2016-12-18 DIAGNOSIS — E1122 Type 2 diabetes mellitus with diabetic chronic kidney disease: Secondary | ICD-10-CM | POA: Diagnosis present

## 2016-12-18 DIAGNOSIS — E876 Hypokalemia: Secondary | ICD-10-CM | POA: Diagnosis not present

## 2016-12-18 DIAGNOSIS — F17210 Nicotine dependence, cigarettes, uncomplicated: Secondary | ICD-10-CM | POA: Diagnosis present

## 2016-12-18 DIAGNOSIS — Z7982 Long term (current) use of aspirin: Secondary | ICD-10-CM | POA: Diagnosis not present

## 2016-12-18 DIAGNOSIS — I509 Heart failure, unspecified: Secondary | ICD-10-CM | POA: Diagnosis not present

## 2016-12-18 DIAGNOSIS — J9621 Acute and chronic respiratory failure with hypoxia: Secondary | ICD-10-CM | POA: Diagnosis present

## 2016-12-18 DIAGNOSIS — J438 Other emphysema: Secondary | ICD-10-CM | POA: Diagnosis not present

## 2016-12-18 DIAGNOSIS — Z7902 Long term (current) use of antithrombotics/antiplatelets: Secondary | ICD-10-CM | POA: Diagnosis not present

## 2016-12-18 DIAGNOSIS — Z66 Do not resuscitate: Secondary | ICD-10-CM | POA: Diagnosis present

## 2016-12-18 DIAGNOSIS — Z955 Presence of coronary angioplasty implant and graft: Secondary | ICD-10-CM | POA: Diagnosis not present

## 2016-12-18 DIAGNOSIS — I429 Cardiomyopathy, unspecified: Secondary | ICD-10-CM | POA: Diagnosis present

## 2016-12-18 DIAGNOSIS — Z833 Family history of diabetes mellitus: Secondary | ICD-10-CM | POA: Diagnosis not present

## 2016-12-18 DIAGNOSIS — I10 Essential (primary) hypertension: Secondary | ICD-10-CM | POA: Diagnosis not present

## 2016-12-18 DIAGNOSIS — I5043 Acute on chronic combined systolic (congestive) and diastolic (congestive) heart failure: Secondary | ICD-10-CM | POA: Diagnosis not present

## 2016-12-18 DIAGNOSIS — I2510 Atherosclerotic heart disease of native coronary artery without angina pectoris: Secondary | ICD-10-CM | POA: Diagnosis present

## 2016-12-18 DIAGNOSIS — T502X5A Adverse effect of carbonic-anhydrase inhibitors, benzothiadiazides and other diuretics, initial encounter: Secondary | ICD-10-CM | POA: Diagnosis present

## 2016-12-18 DIAGNOSIS — R0602 Shortness of breath: Secondary | ICD-10-CM | POA: Diagnosis present

## 2016-12-18 DIAGNOSIS — K219 Gastro-esophageal reflux disease without esophagitis: Secondary | ICD-10-CM | POA: Diagnosis present

## 2016-12-18 DIAGNOSIS — Z9981 Dependence on supplemental oxygen: Secondary | ICD-10-CM | POA: Diagnosis not present

## 2016-12-18 DIAGNOSIS — Z8249 Family history of ischemic heart disease and other diseases of the circulatory system: Secondary | ICD-10-CM | POA: Diagnosis not present

## 2016-12-18 DIAGNOSIS — N179 Acute kidney failure, unspecified: Secondary | ICD-10-CM | POA: Diagnosis present

## 2016-12-18 DIAGNOSIS — I130 Hypertensive heart and chronic kidney disease with heart failure and stage 1 through stage 4 chronic kidney disease, or unspecified chronic kidney disease: Secondary | ICD-10-CM | POA: Diagnosis present

## 2016-12-18 DIAGNOSIS — E785 Hyperlipidemia, unspecified: Secondary | ICD-10-CM | POA: Diagnosis present

## 2016-12-18 DIAGNOSIS — Z95810 Presence of automatic (implantable) cardiac defibrillator: Secondary | ICD-10-CM | POA: Diagnosis not present

## 2016-12-18 DIAGNOSIS — N183 Chronic kidney disease, stage 3 (moderate): Secondary | ICD-10-CM | POA: Diagnosis present

## 2016-12-18 DIAGNOSIS — I252 Old myocardial infarction: Secondary | ICD-10-CM | POA: Diagnosis not present

## 2016-12-18 DIAGNOSIS — M797 Fibromyalgia: Secondary | ICD-10-CM | POA: Diagnosis present

## 2016-12-18 DIAGNOSIS — E1151 Type 2 diabetes mellitus with diabetic peripheral angiopathy without gangrene: Secondary | ICD-10-CM | POA: Diagnosis present

## 2016-12-18 LAB — BASIC METABOLIC PANEL
ANION GAP: 11 (ref 5–15)
BUN: 20 mg/dL (ref 6–20)
CHLORIDE: 99 mmol/L — AB (ref 101–111)
CO2: 28 mmol/L (ref 22–32)
CREATININE: 1.32 mg/dL — AB (ref 0.44–1.00)
Calcium: 9 mg/dL (ref 8.9–10.3)
GFR calc non Af Amer: 38 mL/min — ABNORMAL LOW (ref >60)
GFR, EST AFRICAN AMERICAN: 44 mL/min — AB (ref >60)
Glucose, Bld: 293 mg/dL — ABNORMAL HIGH (ref 65–99)
POTASSIUM: 3.4 mmol/L — AB (ref 3.5–5.1)
Sodium: 138 mmol/L (ref 135–145)

## 2016-12-18 LAB — CBC
HEMATOCRIT: 37.8 % (ref 36.0–46.0)
HEMOGLOBIN: 11.8 g/dL — AB (ref 12.0–15.0)
MCH: 28.4 pg (ref 26.0–34.0)
MCHC: 31.2 g/dL (ref 30.0–36.0)
MCV: 91.1 fL (ref 78.0–100.0)
Platelets: 241 10*3/uL (ref 150–400)
RBC: 4.15 MIL/uL (ref 3.87–5.11)
RDW: 19.5 % — ABNORMAL HIGH (ref 11.5–15.5)
WBC: 9.6 10*3/uL (ref 4.0–10.5)

## 2016-12-18 LAB — GLUCOSE, CAPILLARY
GLUCOSE-CAPILLARY: 136 mg/dL — AB (ref 65–99)
Glucose-Capillary: 159 mg/dL — ABNORMAL HIGH (ref 65–99)
Glucose-Capillary: 112 mg/dL — ABNORMAL HIGH (ref 65–99)
Glucose-Capillary: 205 mg/dL — ABNORMAL HIGH (ref 65–99)

## 2016-12-18 LAB — TROPONIN I: Troponin I: <0.03 ng/mL (ref <0.03)

## 2016-12-18 LAB — ECHOCARDIOGRAM COMPLETE
Height: 65.5 in
Weight: 2839.52 oz

## 2016-12-18 NOTE — Progress Notes (Signed)
[facility]                                                                              Patient Demographics  Sydney Phillips, is a 81 y.o. female, DOB - Oct 15, 1935, LKG:[id number]  Admit date - 12/17/2016   Admitting Physician Lily Kocher, MD  Outpatient Primary MD for the patient is Laurey Morale, MD  Outpatient specialists:   LOS - 0  days   Medical records reviewed and are as summarized below:    Chief Complaint  Patient presents with  . Shortness of Breath       Brief summary   Patient is a 81 year old female with CAD S/P two stens, chronic combined CHF (EF 20-25% per last echo in July 2018), CKD 3, COPD with chronic hypoxic respiratory failure (she is on 2L  at home), "borderline" Type 2 Diabetes, HTN, HLD, PAD S/P fempop bypass to right leg, and AICD/PPM who presents to the ED for evaluation of chest tightness, shortness of breath, and 10lb weight gain in the last 3 days prior to admission. No lower extremity swelling. Typically wears home O2 2 L however had increased to 4 L over the past 2 days. In ED, was placed on BiPAP for tachypnea and O2 sats in the 80s.   Assessment & Plan    Principal Problem:  Acute on chronic hypoxic respiratory failure secondary to Acute on chronic combined systolic and diastolic CHF (congestive heart failure) (HCC) - Currently BiPAP off, weaning O2 as tolerated, at baseline on 2 L - Obtain 2-D echo - Strict I's and O's and daily weights - Placed on Lasix 80 mg IV q12hrs, (at home takes 80 mg and 120 mg every other day alternating) - Creatinine trending up likely due to aggressive diuresis, 1.3, hold ACE inhibitor - cardiology consulted, baseline weight below 170  Active Problems:   Essential hypertension - Currently stable, continue beta blocker    COPD (chronic obstructive pulmonary disease) with emphysema (HCC) - Currently stable no wheezing, continued duonebs as needed    Hypokalemia - Replaced   Biventricular implantable cardioverter-defibrillator in situ - Cardiology consulted, will interrogate ICD    Diabetes mellitus ([place]), type II with history of CAD - CBG is somewhat elevated, hemoglobin A1c 7.2 in April 2018 - Recheck hemoglobin A1c  Coronary artery disease - Cardiac cath in 12/17 showed severe 2 vessel CAD of RCA and left circumflex treated successfully with PCI in each vessel - Continue aspirin, Plavix, beta blocker, statin. Hold the lisinopril due to acute kidney injury - Troponins negative  Acute renal insufficiency - Likely due to diuresis, hold ACE inhibitor  Hyperlipidemia - Continue statin  Code Status: DNR DVT Prophylaxis:  Lovenox  Family Communication: Discussed in detail with the patient, all imaging results, lab results explained to the patient   Disposition Plan:   Time Spent in minutes  25 minutes  Procedures:  None  Consultants:   Cardiology  Antimicrobials:      Medications  Scheduled Meds: . allopurinol  300 mg Oral Daily  . ALPRAZolam  1 mg Oral QHS  . aspirin  81 mg Oral Daily  .  atorvastatin  40 mg Oral q1800  . bisoprolol  5 mg Oral Daily  . clopidogrel  75 mg Oral Daily  . DULoxetine  60 mg Oral BID  . enoxaparin (LOVENOX) injection  40 mg Subcutaneous Q24H  . furosemide  80 mg Intravenous Q12H  . insulin aspart  0-9 Units Subcutaneous TID WC  . lisinopril  5 mg Oral Daily  . magnesium oxide  400 mg Oral BID  . potassium chloride  40 mEq Oral Once  . potassium chloride  40 mEq Oral BID  . sodium chloride flush  3 mL Intravenous Q12H  . traZODone  50 mg Oral QHS   Continuous Infusions: PRN Meds:.acetaminophen **OR** acetaminophen, HYDROcodone-acetaminophen, ipratropium-albuterol, nitroGLYCERIN, ondansetron **OR** ondansetron (ZOFRAN) IV   Antibiotics   Anti-infectives    None        Subjective:   Quincie Haroon was seen and examined today.Feels a lot better, shortness of breath is significantly improving  from the time of admission. Off BiPAP.  Patient denies dizziness, abdominal pain, N/V/D/C, new weakness, numbess, tingling. No chest pain.  Objective:   Vitals:   12/18/16 0300 12/18/16 0400 12/18/16 0600 12/18/16 1100  BP: (!) 119/53 (!) 112/55    Pulse: 72 73    Resp: 18 19    Temp:      TempSrc:      SpO2: 92% 92%    Weight:   80.5 kg (177 lb 7.5 oz)   Height:    5' 5.5" (1.664 m)    Intake/Output Summary (Last 24 hours) at 12/18/16 1259 Last data filed at 12/18/16 0845  Gross per 24 hour  Intake              870 ml  Output             1500 ml  Net             -630 ml     Wt Readings from Last 3 Encounters:  12/18/16 80.5 kg (177 lb 7.5 oz)  10/07/16 76.7 kg (169 lb)  09/25/16 79.8 kg (176 lb)     Exam  General: Alert and oriented x 3, NAD  Eyes: PERRLA, EOMI, Anicteric Sclera,  HEENT:  Atraumatic, normocephalic, normal oropharynx, +JVD  Cardiovascular: S1 S2 auscultated, no rubs, murmurs or gallops. Regular rate and rhythm.  Respiratory: Bibasilar crackles  Gastrointestinal: Soft, nontender, nondistended, + bowel sounds  Ext: no pedal edema bilaterally  Neuro: AAOx3, Cr N's II- XII. Strength 5/5 upper and lower extremities bilaterally, speech clear, sensations grossly intact  Musculoskeletal: No digital cyanosis, clubbing  Skin: No rashes  Psych: Normal affect and demeanor, alert and oriented x3    Data Reviewed:  I have personally reviewed following labs and imaging studies  Micro Results No results found for this or any previous visit (from the past 240 hour(s)).  Radiology Reports Dg Chest Port 1 View  Result Date: 12/17/2016 CLINICAL DATA:  Shortness of breath 2 days becoming worse. Weight gain. EXAM: PORTABLE CHEST 1 VIEW COMPARISON:  09/04/2016 FINDINGS: Left-sided pacemaker unchanged. Lungs are adequately inflated with mild prominence of the perihilar markings likely mild vascular congestion. No focal airspace consolidation. No definite  effusion. Moderate stable cardiomegaly. Calcified granuloma over the left midlung. Degenerative change of the spine. Minimal calcified plaque over the aortic arch. IMPRESSION: Stable moderate cardiomegaly with suggestion mild vascular congestion. Electronically Signed   By: Marin Olp M.D.   On: 12/17/2016 10:44    Lab Data:  CBC:  Recent Labs Lab 12/17/16 1030 12/18/16 0016  WBC 12.6* 9.6  HGB 13.7 11.8*  HCT 45.0 37.8  MCV 92.0 91.1  PLT 239 630   Basic Metabolic Panel:  Recent Labs Lab 12/17/16 1030 12/17/16 1400 12/18/16 0016  NA 139 141 138  K 2.7* 2.4* 3.4*  CL 100* 101 99*  CO2 28 28 28   GLUCOSE 188* 209* 293*  BUN 10 9 20   CREATININE 1.00 1.09* 1.32*  CALCIUM 9.0 8.9 9.0   GFR: Estimated Creatinine Clearance: 37.2 mL/min (A) (by C-G formula based on SCr of 1.32 mg/dL (H)). Liver Function Tests: No results for input(s): AST, ALT, ALKPHOS, BILITOT, PROT, ALBUMIN in the last 168 hours. No results for input(s): LIPASE, AMYLASE in the last 168 hours. No results for input(s): AMMONIA in the last 168 hours. Coagulation Profile: No results for input(s): INR, PROTIME in the last 168 hours. Cardiac Enzymes:  Recent Labs Lab 12/17/16 1400 12/17/16 1801 12/18/16 0016  TROPONINI <0.03 <0.03 <0.03   BNP (last 3 results) No results for input(s): PROBNP in the last 8760 hours. HbA1C: No results for input(s): HGBA1C in the last 72 hours. CBG:  Recent Labs Lab 12/17/16 1619 12/17/16 2105 12/18/16 0824 12/18/16 1146  GLUCAP 185* 264* 136* 159*   Lipid Profile: No results for input(s): CHOL, HDL, LDLCALC, TRIG, CHOLHDL, LDLDIRECT in the last 72 hours. Thyroid Function Tests: No results for input(s): TSH, T4TOTAL, FREET4, T3FREE, THYROIDAB in the last 72 hours. Anemia Panel: No results for input(s): VITAMINB12, FOLATE, FERRITIN, TIBC, IRON, RETICCTPCT in the last 72 hours. Urine analysis:    Component Value Date/Time   COLORURINE YELLOW 12/17/2016 [place] 12/17/2016 1634   LABSPEC 1.008 12/17/2016 1634   PHURINE 6.0 12/17/2016 1634   GLUCOSEU NEGATIVE 12/17/2016 1634   HGBUR NEGATIVE 12/17/2016 1634   HGBUR negative 01/01/2010 [place] 12/17/2016 1634   BILIRUBINUR n 09/25/2016 [place] 12/17/2016 1634   PROTEINUR NEGATIVE 12/17/2016 1634   UROBILINOGEN 0.2 09/25/2016 1552   UROBILINOGEN 1.0 11/03/2014 1442   NITRITE NEGATIVE 12/17/2016 [place] 12/17/2016 1634     Kimbery Harwood M.D. [facility] 12/18/2016, 12:59 PM  Pager: [phone] Between 7am to 7pm - call Pager - [phone]  After 7pm go to [email or web address] - password [email or web address]  Call night coverage person covering after 7pm

## 2016-12-18 NOTE — Plan of Care (Signed)
Problem: Safety: Goal: Ability to remain free from injury will improve Outcome: Progressing Explained to patient to call using call light or white board and phone with RN/NT numbers for assistance and not to get OOB on her own, also bed alarm will be placed on at night after family leaves for her protection and patient okay with that.

## 2016-12-18 NOTE — Progress Notes (Signed)
*  PRELIMINARY RESULTS* Echocardiogram 2D Echocardiogram has been performed.  Leavy Cella 12/18/2016, 4:18 PM

## 2016-12-18 NOTE — Progress Notes (Signed)
Report received in patient's room via Kern Reap RN using SBAR format, reviewed VS, meds, labs and patient's general condition, assumed care of patient.

## 2016-12-18 NOTE — Consult Note (Signed)
Cardiology Consultation:   Patient ID: HAELI GERLICH; [id number]; [birth year]   Admit date: 12/17/2016 Date of Consult: 12/18/2016  Primary Care Provider: Laurey Morale, MD Primary Cardiologist: Dr. Sallyanne Kuster   Patient Profile:   Sydney Phillips is a 81 y.o. female with a hx of mixed ischemic and non-ishemic cardiomyopathy, CRT-D, NSTEMI and DES to RCA and left Cx 05/2016, CKD stage 3, COPD with chronic hypoxic respiratory failure on 2L O2, type 2 DM (borderline), HTN, HLD, PAD s/p fem-pop to r leg who is being seen today for the evaluation of CHF at the request of Dr Tana Coast.  History of Present Illness:   Ms. Cutrone has a hx of mixed ischemic and non-ischemic cardiomyopathy. In 2009 she had an ED of 20%. This improved to 50% in 2013 with CRT.  She did well for several years. Echo in 02/2016 showed EF 40-45%. In 05/2016 she had a Non-STEMI in the setting of COPD exacerbation. Echo in 06/2016 showed EF 20-25% with severely dilated LV, mod MR, PA pressure 53 mmHg. She presented to the ED yesterday with complaints of shortness of breath and 10 lb wt gain over the previous week. She had no lower extremity.  She is still smoking 1PPD. She says that she will make a real effort to quit. Her son has told her that he dose not want her to smoke anymore.She has chronic doe and orthopnea.  She says that she watches her salt intake closely. She tries to avoid being out in the heat as it causes her trouble breathing. When she does go out she drinks more water. On Tuesday night she noted that she had a fullness in her abdomen after eating dinner. When she got up from the table she noted shortness of breath. She went on to bed and slept well without increased orthopnea. She awoke in the morning with difficulty breathing and presented to the ED. She had chest tightness with breathing but no pressure. She denies any edema. She had noted about a 10 pound weight gain over the previous week.   Pertinent labs  include: Negative troponins X 4.  BNP 1,060.6.  K+ 2.7.  SCr 1.00-->1.32 Chest Xray- stable moderate cardiomegaly with suggestion of mild vascular congestion  Past Medical History:  Diagnosis Date  . Arthritis   . Automatic implantable cardioverter-defibrillator in situ   . Bronchitis   . CAD (coronary artery disease)    a. s/p DES to LCx/RCA 05/2016, ostial LAD disease.  . Cardiomyopathy ([place])   . Chronic back pain   . Chronic combined systolic and diastolic CHF (congestive heart failure) ([place])   . Chronic constipation   . Chronic pain   . CKD (chronic kidney disease), stage III   . Colon polyps 2003.  2015.   HP polyps 2003.  adnomas 2015.  required referal to [facility] for colonoscopic resection of flat polyps.   Marland Kitchen COPD (chronic obstructive pulmonary disease) ([place])   . Dementia   . Depression with anxiety    takes Cymbalta daily  . Diabetes mellitus ([place])   . Early cataracts, bilateral   . Fibromyalgia   . GERD (gastroesophageal reflux disease)    was on meds but was taken off;now watches what she eats  . Hemorrhoids   . History of kidney stones   . Hx of colonic polyps   . Hyperlipemia    takes Crestor daily  . Hypertension    takes Amlodipine and Metoprolol daily  . Insomnia   .  LBBB (left bundle branch block)    Stress test 09/03/2010, EF 55  . Myocardial infarction ([place])   . PAD (peripheral artery disease) ([facility])    Carotid, subclavian, and lower extremity beds, currently not symptomatic  . Presence of combination internal cardiac defibrillator (ICD) and pacemaker   . Presence of permanent cardiac pacemaker   . Pulmonary hypertension ([place])   . S/P angioplasty with stent, lt. subclavian 07/31/11 08/01/2011  . Subclavian arterial stenosis, lt, with PTA/STENT 07/31/11 08/01/2011  . Syncope 07/28/2011   EF - 50-55, moderate concentric hypertrophy in left ventricle  . Urinary incontinence   . Vertigo    takes Meclizine prn    Past Surgical History:  Procedure Laterality  Date  . ABDOMINAL AORTAGRAM N/A 08/12/2013   Procedure: ABDOMINAL Maxcine Ham;  Surgeon: Elam Dutch, MD;  Location: [facility] CATH LAB;  Service: Cardiovascular;  Laterality: N/A;  . ABDOMINAL HYSTERECTOMY    . APPENDECTOMY    . BACK SURGERY  2012  . BIV ICD GENERTAOR CHANGE OUT Left 02/20/2012   Procedure: BIV ICD GENERTAOR CHANGE OUT;  Surgeon: [name], MD;  Location: [facility] CATH LAB;  Service: Cardiovascular;  Laterality: Left;  . CARDIAC CATHETERIZATION  12/01/2007   By Dr. Melvern Banker, left heart cath,   . CARDIAC CATHETERIZATION N/A 05/20/2016   Procedure: Right/Left Heart Cath and Coronary Angiography;  Surgeon: Sherren Mocha, MD;  Location: [place] CV LAB;  Service: Cardiovascular;  Laterality: N/A;  . CARDIAC CATHETERIZATION N/A 05/20/2016   Procedure: Coronary Stent Intervention;  Surgeon: Sherren Mocha, MD;  Location: [place] CV LAB;  Service: Cardiovascular;  Laterality: N/A;  . CARDIAC DEFIBRILLATOR PLACEMENT  05/2008   By Dr Blanch Media, [employer] CANNOT HAVE MRI's  . CAROTID ANGIOGRAM N/A 07/31/2011   Procedure: CAROTID ANGIOGRAM;  Surgeon: Lorretta Harp, MD;  Location: [facility] CATH LAB;  Service: Cardiovascular;  Laterality: N/A;  carotid angiogram and possible Lt SCA PTA  . COLONOSCOPY W/ POLYPECTOMY  12/2013  . CORONARY ANGIOPLASTY    . ENDARTERECTOMY Left 11/08/2014   Procedure: LEFT CAROTID ENDARTERECTOMY WITH HEMASHIELD PATCH ANGIOPLASTY;  Surgeon: Elam Dutch, MD;  Location: [place];  Service: Vascular;  Laterality: Left;  . ESOPHAGOGASTRODUODENOSCOPY N/A 01/20/2014   Procedure: ESOPHAGOGASTRODUODENOSCOPY (EGD);  Surgeon: Jerene Bears, MD;  Location[facility] ENDOSCOPY;  Service: Endoscopy;  Laterality: N/A;  . FEMORAL-POPLITEAL BYPASS GRAFT Right 10/12/2013   Procedure:   Femoral-Peroneal trunk  bypass with nonreversed greater saphenous vein graft;  Surgeon: Elam Dutch, MD;  Location: [place];  Service: Vascular;  Laterality: Right;  . GIVENS CAPSULE STUDY N/A 01/20/2014    Procedure: GIVENS CAPSULE STUDY;  Surgeon: Jerene Bears, MD;  Location: [place];  Service: Gastroenterology;  Laterality: N/A;  . INTRAOPERATIVE ARTERIOGRAM Right 10/12/2013   Procedure: INTRA OPERATIVE ARTERIOGRAM;  Surgeon: Elam Dutch, MD;  Location: [place];  Service: Vascular;  Laterality: Right;  . ORIF ELBOW FRACTURE  08/16/2011   Procedure: OPEN REDUCTION INTERNAL FIXATION (ORIF) ELBOW/OLECRANON FRACTURE;  Surgeon: Schuyler Amor, MD;  Location: [place];  Service: Orthopedics;  Laterality: Left;  . RENAL ANGIOGRAM N/A 08/12/2013   Procedure: RENAL ANGIOGRAM;  Surgeon: Elam Dutch, MD;  Location: [facility] CATH LAB;  Service: Cardiovascular;  Laterality: N/A;  . SUBCLAVIAN STENT PLACEMENT Left 07/31/2011   7x18 Genesis, balloon, with reduction of 90% ostial left subclavian artery stenosis to 0% with residual excellent flow  . TONSILLECTOMY    . TUBAL LIGATION       Inpatient  Medications: Scheduled Meds: . allopurinol  300 mg Oral Daily  . ALPRAZolam  1 mg Oral QHS  . aspirin  81 mg Oral Daily  . atorvastatin  40 mg Oral q1800  . bisoprolol  5 mg Oral Daily  . clopidogrel  75 mg Oral Daily  . DULoxetine  60 mg Oral BID  . enoxaparin (LOVENOX) injection  40 mg Subcutaneous Q24H  . furosemide  80 mg Intravenous Q12H  . insulin aspart  0-9 Units Subcutaneous TID WC  . lisinopril  5 mg Oral Daily  . magnesium oxide  400 mg Oral BID  . potassium chloride  40 mEq Oral Once  . potassium chloride  40 mEq Oral BID  . sodium chloride flush  3 mL Intravenous Q12H  . traZODone  50 mg Oral QHS   Continuous Infusions:  PRN Meds: acetaminophen **OR** acetaminophen, HYDROcodone-acetaminophen, ipratropium-albuterol, nitroGLYCERIN, ondansetron **OR** ondansetron (ZOFRAN) IV  Allergies:    Allergies  Allergen Reactions  . Potassium-Containing Compounds Other (See Comments)    Causes severe constipation  . Azithromycin Rash  . Codeine Itching  . Darvon Itching  . Erythromycin Rash    . Meloxicam Other (See Comments)    Unknown    . Norco [Hydrocodone-Acetaminophen] Itching  . Penicillins Rash and Other (See Comments)    Has patient had a PCN reaction causing immediate rash, facial/tongue/throat swelling, SOB or lightheadedness with hypotension: unknown Has patient had a PCN reaction causing severe rash involving mucus membranes or skin necrosis: no Has patient had a PCN reaction that required hospitalization: unknown Has patient had a PCN reaction occurring within the last 10 years: no If all of the above answers are "NO", then may proceed with Cephalosporin use.   Marland Kitchen Propoxyphene N-Acetaminophen Itching  . Rofecoxib Other (See Comments)    Unknown    . Rosuvastatin Other (See Comments)    cramps  . Statins Itching and Other (See Comments)    Sleeplessness  . Sulfa Antibiotics Rash    Social History:   Social History   Social History  . Marital status: Widowed    Spouse name: N/A  . Number of children: 3  . Years of education: 12   Occupational History  . Retired    Social History Main Topics  . Smoking status: Current Every Day Smoker    Packs/day: 0.50    Years: 62.00    Types: Cigarettes  . Smokeless tobacco: Never Used     Comment: Down to 3-4 cigarettes per day  . Alcohol use No  . Drug use: No  . Sexual activity: No   Other Topics Concern  . Not on file   Social History Narrative   Ok to share information with medical POA, Son Gabriel Carina   Right-handed   Caffeine: Pepsi    Family History:   The patient's family history includes CAD in her father; Cancer in her sister; Colon cancer in her maternal grandmother; Deep vein thrombosis in her son; Diabetes in her sister; Heart disease in her father, mother, and sister; Hyperlipidemia in her father. There is no history of Anesthesia problems, Hypotension, Malignant hyperthermia, or Pseudochol deficiency.  ROS:  Please see the history of present illness.  ROS  All other ROS reviewed and  negative.     Physical Exam/Data:   Vitals:   12/18/16 0000 12/18/16 0300 12/18/16 0400 12/18/16 0600  BP: (!) 131/51 (!) 119/53 (!) 112/55   Pulse: 77 72 73   Resp: 20 18 19  Temp:      TempSrc:      SpO2: 91% 92% 92%   Weight:    177 lb 7.5 oz (80.5 kg)    Intake/Output Summary (Last 24 hours) at 12/18/16 1027 Last data filed at 12/18/16 0400  Gross per 24 hour  Intake              820 ml  Output             1500 ml  Net             -680 ml   Filed Weights   12/18/16 0600  Weight: 177 lb 7.5 oz (80.5 kg)   Body mass index is 29.53 kg/m.  General:  Well nourished, chronically ill appearing female, in no acute distress HEENT: normal Lymph: no adenopathy Neck:  JVD present Endocrine:  No thryomegaly Vascular: No carotid bruits; FA pulses 2+ bilaterally without bruits  Cardiac:  normal S1, S2; RRR; no murmur  Lungs:  clear to auscultation bilaterally, except for scattered dry crackles Abd: soft, nontender, no hepatomegaly  Ext: no edema Musculoskeletal:  No deformities, BUE and BLE strength normal and equal Skin: warm and dry  Neuro:  CNs 2-12 intact, no focal abnormalities noted Psych:  Normal affect   EKG:  The EKG was personally reviewed and demonstrates:  Ventricular pacing 103 bpm Telemetry:  Telemetry was personally reviewed and demonstrates:  Ventricular pacing with rates in the 70's and low 80's  Relevant CV Studies:  Echo 06/27/16 Study Conclusions  - Left ventricle: The cavity size was severely dilated. Wall   thickness was normal. Systolic function was severely reduced. The   estimated ejection fraction was in the range of 20% to 25%.   Diffuse hypokinesis. Doppler parameters are consistent with both   elevated ventricular end-diastolic filling pressure and elevated   left atrial filling pressure. - Aortic valve: Valve area (VTI): 2.05 cm^2. Valve area (Vmax):   1.93 cm^2. Valve area (Vmean): 1.99 cm^2. - Mitral valve: There was moderate  regurgitation. Valve area by   continuity equation (using LVOT flow): 2.19 cm^2. - Left atrium: The atrium was moderately dilated. - Atrial septum: No defect or patent foramen ovale was identified. - Pulmonary arteries: PA peak pressure: 53 mm Hg (S).  Right/Left heart cath 05/20/16 by Dr. Angelena Form Conclusion   1. Severe 2 vessel CAD of the RCA and LCx, treated successfully with drug-eluting stents in each vessel 2. Moderate ostial LAD stenosis 3. Congestive heart failure with elevated PCWP/LVEDP 4. Moderate pulmonary HTN likely related to left heart disease (PVR 2 [place] units)  DAPT with ASA and Brilinta 12 months      Laboratory Data:  Chemistry Recent Labs Lab 12/17/16 1030 12/17/16 1400 12/18/16 0016  NA 139 141 138  K 2.7* 2.4* 3.4*  CL 100* 101 99*  CO2 28 28 28   GLUCOSE 188* 209* 293*  BUN 10 9 20   CREATININE 1.00 1.09* 1.32*  CALCIUM 9.0 8.9 9.0  GFRNONAA 53* 47* 38*  GFRAA >60 55* 44*  ANIONGAP 11 12 11     No results for input(s): PROT, ALBUMIN, AST, ALT, ALKPHOS, BILITOT in the last 168 hours. Hematology Recent Labs Lab 12/17/16 1030 12/18/16 0016  WBC 12.6* 9.6  RBC 4.89 4.15  HGB 13.7 11.8*  HCT 45.0 37.8  MCV 92.0 91.1  MCH 28.0 28.4  MCHC 30.4 31.2  RDW 19.8* 19.5*  PLT 239 241   Cardiac Enzymes Recent Labs Lab 12/17/16 1400 12/17/16 1801  12/18/16 0016  TROPONINI <0.03 <0.03 <0.03    Recent Labs Lab 12/17/16 1036  TROPIPOC 0.02    BNP Recent Labs Lab 12/17/16 1030  BNP 1,060.6*    DDimer No results for input(s): DDIMER in the last 168 hours.  Radiology/Studies:  Dg Chest Port 1 View  Result Date: 12/17/2016 CLINICAL DATA:  Shortness of breath 2 days becoming worse. Weight gain. EXAM: PORTABLE CHEST 1 VIEW COMPARISON:  09/04/2016 FINDINGS: Left-sided pacemaker unchanged. Lungs are adequately inflated with mild prominence of the perihilar markings likely mild vascular congestion. No focal airspace consolidation. No definite  effusion. Moderate stable cardiomegaly. Calcified granuloma over the left midlung. Degenerative change of the spine. Minimal calcified plaque over the aortic arch. IMPRESSION: Stable moderate cardiomegaly with suggestion mild vascular congestion. Electronically Signed   By: Marin Olp M.D.   On: 12/17/2016 10:44    Assessment and Plan:   1. Acute on chronic systolic heart failure: Most recent EF 20-25% in 06/2016. Has CRT therapy. Treated medically with bisoprolol, lasix 80 mg (1-1.5 tabs) daily, Lisinopril. Admitted with complaints of increased shortness of breath and wt gain of 10 lbs over the previous week. BNP elevated at 1060. Current therapy with lasix 80 mg IV q 12h. Wt today 177 lbs. Wt at last office visit 169 in 09/2016. 1500 ml UOP so far. Continue with IV diuresis. Monitor renal function. Recheck echo. Will have [employer] rep interrogate CRT-D Optivol. ([employer])        Low sodium diet. Strict I&O. Daily wts.  2. CAD S/P DES to RCA and Cx 05/2016. On DAPT with aspirin and Plavix. No chest pain. Troponins negative X 4.  3. Chronic respiratory failure: on supplemental 2L oxygen.  4. Smoking: continues to smoke 1PPD. Strongly urged cessation. 5. Hypertension: BP elevated initially, better this am.  6. Hypokalemia: K+ 2.7 on presentation, replaced, K+ 3.4 today. Continue to monitor and replace.  7. Hyperlipemia:  On atorvastatin 40 mg. LDL 145 on 05/15/16. Recheck lipids and consider maximizing therapy.   Signed, Daune Perch, NP  12/18/2016 10:27 AM  I have seen and examined the patient along with Daune Perch, NP.  I have reviewed the chart, notes and new data. I agree with NP's note.  Key new complaints: feels well, much better since admission. She has never had angina, even with severe CAD presentation always with dyspnea. She is roughly 7 months s/p 2 vessel PCI with DES. Key examination changes: normal rhythm, normal JVP, no edema, no S3  Key new findings / data: CRT-D device  check pending  PLAN: Decompensation was preceded by significant volume gain, despite reported compliance with sodium restriction and meds. Improved with diuresis - target diuresis to weight of 169 lb or less. Check Optivol. ICD was approaching ERI by battery voltage, will review this also. Consider outpatient Myoview to look for restenosis.   [name], MD, [place] [phone] 12/18/2016, 1:00 PM

## 2016-12-19 ENCOUNTER — Ambulatory Visit: Payer: Medicare Other | Admitting: Family Medicine

## 2016-12-19 DIAGNOSIS — J438 Other emphysema: Secondary | ICD-10-CM

## 2016-12-19 LAB — BASIC METABOLIC PANEL
ANION GAP: 10 (ref 5–15)
BUN: 24 mg/dL — ABNORMAL HIGH (ref 6–20)
CALCIUM: 9.9 mg/dL (ref 8.9–10.3)
CO2: 31 mmol/L (ref 22–32)
CREATININE: 1.06 mg/dL — AB (ref 0.44–1.00)
Chloride: 98 mmol/L — ABNORMAL LOW (ref 101–111)
GFR, EST AFRICAN AMERICAN: 57 mL/min — AB (ref >60)
GFR, EST NON AFRICAN AMERICAN: 49 mL/min — AB (ref >60)
Glucose, Bld: 149 mg/dL — ABNORMAL HIGH (ref 65–99)
Potassium: 4.7 mmol/L (ref 3.5–5.1)
SODIUM: 139 mmol/L (ref 135–145)

## 2016-12-19 LAB — LIPID PANEL
CHOLESTEROL: 212 mg/dL — AB (ref 0–200)
HDL: 47 mg/dL (ref >40)
LDL CALC: 140 mg/dL — AB (ref 0–99)
TRIGLYCERIDES: 124 mg/dL (ref <150)
Total CHOL/HDL Ratio: 4.5 RATIO
VLDL: 25 mg/dL (ref 0–40)

## 2016-12-19 LAB — HEMOGLOBIN A1C
HEMOGLOBIN A1C: 6.1 % — AB (ref 4.8–5.6)
MEAN PLASMA GLUCOSE: 128 mg/dL

## 2016-12-19 LAB — GLUCOSE, CAPILLARY
GLUCOSE-CAPILLARY: 101 mg/dL — AB (ref 65–99)
GLUCOSE-CAPILLARY: 126 mg/dL — AB (ref 65–99)
Glucose-Capillary: 150 mg/dL — ABNORMAL HIGH (ref 65–99)
Glucose-Capillary: 165 mg/dL — ABNORMAL HIGH (ref 65–99)

## 2016-12-19 MED ORDER — FUROSEMIDE 80 MG PO TABS
80.0000 mg | ORAL_TABLET | Freq: Two times a day (BID) | ORAL | Status: DC
  Administered 2016-12-19 – 2016-12-20 (×3): 80 mg via ORAL
  Filled 2016-12-19 (×3): qty 1

## 2016-12-19 MED ORDER — NAPROXEN 250 MG PO TABS
250.0000 mg | ORAL_TABLET | Freq: Once | ORAL | Status: AC
  Administered 2016-12-19: 250 mg via ORAL
  Filled 2016-12-19: qty 1

## 2016-12-19 MED ORDER — POTASSIUM CHLORIDE CRYS ER 20 MEQ PO TBCR: 40.0000 meq | EXTENDED_RELEASE_TABLET | Freq: Once | ORAL | Status: AC

## 2016-12-19 MED ORDER — IPRATROPIUM-ALBUTEROL 0.5-2.5 (3) MG/3ML IN SOLN
3.0000 mL | Freq: Four times a day (QID) | RESPIRATORY_TRACT | Status: DC
  Administered 2016-12-19 – 2016-12-20 (×3): 3 mL via RESPIRATORY_TRACT
  Filled 2016-12-19 (×3): qty 3

## 2016-12-19 MED ORDER — POTASSIUM CHLORIDE CRYS ER 20 MEQ PO TBCR
40.0000 meq | EXTENDED_RELEASE_TABLET | Freq: Two times a day (BID) | ORAL | Status: DC
  Administered 2016-12-19: 40 meq via ORAL
  Filled 2016-12-19 (×2): qty 2

## 2016-12-19 MED ORDER — POTASSIUM CHLORIDE CRYS ER 20 MEQ PO TBCR
20.0000 meq | EXTENDED_RELEASE_TABLET | Freq: Once | ORAL | Status: AC
  Administered 2016-12-19: 20 meq via ORAL
  Filled 2016-12-19: qty 1

## 2016-12-19 NOTE — Progress Notes (Signed)
Progress Note  Patient Name: Sydney Phillips Date of Encounter: 12/19/2016  Primary Cardiologist: Dr. Sallyanne Kuster  Subjective   Patient is feeling well; denies chest pain, SOB, and palpitations. She states she is breathing better, but did not get any rest last night.   Inpatient Medications    Scheduled Meds: . allopurinol  300 mg Oral Daily  . ALPRAZolam  1 mg Oral QHS  . aspirin  81 mg Oral Daily  . atorvastatin  40 mg Oral q1800  . bisoprolol  5 mg Oral Daily  . clopidogrel  75 mg Oral Daily  . DULoxetine  60 mg Oral BID  . enoxaparin (LOVENOX) injection  40 mg Subcutaneous Q24H  . furosemide  80 mg Intravenous Q12H  . insulin aspart  0-9 Units Subcutaneous TID WC  . magnesium oxide  400 mg Oral BID  . potassium chloride  40 mEq Oral Once  . potassium chloride  40 mEq Oral BID  . sodium chloride flush  3 mL Intravenous Q12H  . traZODone  50 mg Oral QHS   Continuous Infusions:  PRN Meds: acetaminophen **OR** acetaminophen, HYDROcodone-acetaminophen, ipratropium-albuterol, nitroGLYCERIN, ondansetron **OR** ondansetron (ZOFRAN) IV   Vital Signs    Vitals:   12/18/16 1100 12/18/16 1431 12/18/16 1946 12/19/16 0500  BP:  132/64 (!) 142/63 (!) 140/57  Pulse:  78 82 86  Resp:  (!) 24 (!) 22 (!) 28  Temp:  (!) 97.5 F (36.4 C) 98.9 F (37.2 C) 98.5 F (36.9 C)  TempSrc:  Oral Oral Oral  SpO2:  93% 93% 92%  Weight:    175 lb 9.6 oz (79.7 kg)  Height: 5' 5.5" (1.664 m)       Intake/Output Summary (Last 24 hours) at 12/19/16 0756 Last data filed at 12/19/16 0655  Gross per 24 hour  Intake              290 ml  Output             5200 ml  Net            -4910 ml   Filed Weights   12/18/16 0600 12/19/16 0500  Weight: 177 lb 7.5 oz (80.5 kg) 175 lb 9.6 oz (79.7 kg)     Physical Exam   General: Well developed, well nourished, female appearing in no acute distress. Head: Normocephalic, atraumatic.  Neck: Supple without bruits, + JVD. Lungs:  Resp regular and  unlabored, expiratory wheezing throughout Heart: RRR, S1, S2, no S3, S4, or murmur; no rub. Abdomen: Soft, non-tender, non-distended with normoactive bowel sounds. No hepatomegaly. No rebound/guarding. No obvious abdominal masses. Extremities: No clubbing, cyanosis, trace edema. Distal pedal pulses are 2+ bilaterally. Neuro: Alert and oriented X 3. Moves all extremities spontaneously. Psych: Normal affect.  Labs    Chemistry Recent Labs Lab 12/17/16 1030 12/17/16 1400 12/18/16 0016  NA 139 141 138  K 2.7* 2.4* 3.4*  CL 100* 101 99*  CO2 28 28 28   GLUCOSE 188* 209* 293*  BUN 10 9 20   CREATININE 1.00 1.09* 1.32*  CALCIUM 9.0 8.9 9.0  GFRNONAA 53* 47* 38*  GFRAA >60 55* 44*  ANIONGAP 11 12 11      Hematology Recent Labs Lab 12/17/16 1030 12/18/16 0016  WBC 12.6* 9.6  RBC 4.89 4.15  HGB 13.7 11.8*  HCT 45.0 37.8  MCV 92.0 91.1  MCH 28.0 28.4  MCHC 30.4 31.2  RDW 19.8* 19.5*  PLT 239 241    Cardiac Enzymes Recent Labs  Lab 12/17/16 1400 12/17/16 1801 12/18/16 0016  TROPONINI <0.03 <0.03 <0.03    Recent Labs Lab 12/17/16 1036  TROPIPOC 0.02     BNP Recent Labs Lab 12/17/16 1030  BNP 1,060.6*     DDimer No results for input(s): DDIMER in the last 168 hours.   Radiology    Dg Chest Port 1 View  Result Date: 12/17/2016 CLINICAL DATA:  Shortness of breath 2 days becoming worse. Weight gain. EXAM: PORTABLE CHEST 1 VIEW COMPARISON:  09/04/2016 FINDINGS: Left-sided pacemaker unchanged. Lungs are adequately inflated with mild prominence of the perihilar markings likely mild vascular congestion. No focal airspace consolidation. No definite effusion. Moderate stable cardiomegaly. Calcified granuloma over the left midlung. Degenerative change of the spine. Minimal calcified plaque over the aortic arch. IMPRESSION: Stable moderate cardiomegaly with suggestion mild vascular congestion. Electronically Signed   By: Marin Olp M.D.   On: 12/17/2016 10:44      Telemetry    Paced rhythm - Personally Reviewed  ECG    No new tracings - Personally Reviewed  Cardiac Studies   Echocardiogram 12/18/16: Study Conclusions - Left ventricle: Diffuse hypokinesis worse in inferior wall. The   cavity size was severely dilated. Wall thickness was normal.   Systolic function was severely reduced. The estimated ejection   fraction was in the range of 20% to 25%. Diffuse hypokinesis. - Aortic valve: May have some degree of AS masked by poor LV   function but not severe. - Mitral valve: There was moderate regurgitation. Valve area by   pressure half-time: 1.49 cm^2. - Left atrium: The atrium was mildly dilated. - Atrial septum: No defect or patent foramen ovale was identified. - Pulmonary arteries: PA peak pressure: 52 mm Hg (S). - Line: A venous catheter was visualized in the superior vena cava,   with its tip in the right atrium. No abnormal features noted.    Patient Profile     81 y.o. female with a hx of mixed ischemic and non-ishemic cardiomyopathy, CRT-D, NSTEMI and DES to RCA and left Cx 05/2016, CKD stage 3, COPD with chronic hypoxic respiratory failure on 2L O2, type 2 DM (borderline), HTN, HLD, PAD s/p fem-pop to r leg who is being seen for the evaluation of CHF.   Assessment & Plan    1. Acute on chronic systolic heart failure - echo with stable systolic dysfunction, EF 63-89% - she is diuresing on 80 mg IV lasix BID - she is overall net negative 5.5 L with 5.2 L urine output yesterday - weight remains elevated to 175 lbs, down from admission weight of 177 lbs; dry weight goal is 169 lbs - will obtain a standing weight today - transitioned IV lasix to PO lasix, home regimen is 80-120 mg daily - asked nursing to ambulate patient - K 3.4, replace   2. CKD stage III - sCr 1.32 (1.09), baseline appears to be 1.00-1.09 - making adequate urine, foley in place for retention   3. CAD s/p DES to RCA and LCx - continue ASA and  plavix   4. COPD - currently receiving breathing treatment - pt states her breathing is improved - she still requires supplemental O2, has nocturnal oxygen at home   Signed, Ledora Bottcher , PA-C 7:56 AM 12/19/2016 Pager: [phone]   I have seen and examined the patient along with Ledora Bottcher , PA-C.  I have reviewed the chart, notes and new data.  I agree with PA's note.  Key new complaints:  Breathing is greatly improved does not have orthopnea. Has not walked yet Key examination changes: Jugular venous pulsations are not elevated, lungs are clear, no edema. S3 is still present Key new findings / data: Echo yesterday afternoon still showed evidence of marked fluid overload (echo report does not comment on diastolic function, but mitral inflow and pulmonary vein flow show markedly elevated mean left atrial pressure). Moderate pulmonary artery hypertension with estimated systolic pressure around 52 mmHg However, since it was performed she received another dose of furosemide late in the evening and had a net diuresis of 4 L overnight. Creatinine has increased to 1.3. Weight has reportedly decreased by only 1 pound but I doubt accuracy.  PLAN: Switch to oral diuretics, furosemide 80 mg twice daily. Weight her again with upright scale. Target weight under 170 pounds. She has a narrow margin of compensation. In the past she has had acute renal failure and syncope related to excessive diuresis. Recheck potassium and renal function in the morning. ICD check shows that battery voltage has reached 2.63 V but has not yet triggered RRT. Anticipate need for generator change out in the next 2 or 3 months. No significant arrhythmia since last device check a normal lead parameters. Optivol has been highly volatile and has not really stabilized yet this year.[name], MD, [place] [phone] 12/19/2016, 9:03 AM

## 2016-12-19 NOTE — Progress Notes (Signed)
[facility]                                                                              Patient Demographics  Sydney Phillips, is a 81 y.o. female, DOB - 1935/07/30, NAT:[id number]  Admit date - 12/17/2016   Admitting Physician Lily Kocher, MD  Outpatient Primary MD for the patient is Laurey Morale, MD  Outpatient specialists:   LOS - 1  days   Medical records reviewed and are as summarized below:    Chief Complaint  Patient presents with  . Shortness of Breath       Brief summary   Patient is a 81 year old female with CAD S/P two stens, chronic combined CHF (EF 20-25% per last echo in July 2018), CKD 3, COPD with chronic hypoxic respiratory failure (she is on 2L [place] at home), "borderline" Type 2 Diabetes, HTN, HLD, PAD S/P fempop bypass to right leg, and AICD/PPM who presents to the ED for evaluation of chest tightness, shortness of breath, and 10lb weight gain in the last 3 days prior to admission. No lower extremity swelling. Typically wears home O2 2 L however had increased to 4 L over the past 2 days. In ED, was placed on BiPAP for tachypnea and O2 sats in the 80s.   Assessment & Plan    Principal Problem:  Acute on chronic hypoxic respiratory failure secondary to Acute on chronic combined systolic and diastolic CHF (congestive heart failure) (HCC) - BiPAP off, O2 weaned, currently sats 94% on 2 L, at baseline  - Continue strict I's and O's and daily weights - Patient was placed on aggressive IV diuresis, (at home takes 80 mg and 120 mg every other day alternating) - Cardiology consulted. Transition to oral Lasix today 80 mg twice a day - Negative balance of 7.5 L  Active Problems:   Essential hypertension - BP slightly elevated side, continue Lasix, beta blocker    COPD (chronic obstructive pulmonary disease) with emphysema (HCC) - Wheezing noted, will place on scheduled nebs    Hypokalemia - Resolved    Biventricular implantable  cardioverter-defibrillator in situ - ICD interrogated, no significant arrhythmia since last device check    Diabetes mellitus ([place]), type II with history of CAD - Hemoglobin A1c 6.1, improving from previous hemoglobin A1c 7.2 and 4/18 - CBG 149 this morning, Continue sliding scale insulin  Coronary artery disease - Cardiac cath in 12/17 showed severe 2 vessel CAD of RCA and left circumflex treated successfully with PCI in each vessel - Continue aspirin, Plavix, beta blocker, statin.  -Continue to hold ACE inhibitor  Acute renal insufficiency - Improved, likely due to diuresis, continue to hold ACE inhibitor for now, will resume at discharge  Hyperlipidemia - LDL 140, continue statin  Code Status: DNR DVT Prophylaxis:  Lovenox  Family Communication: Discussed in detail with the patient, all imaging results, lab results explained to the patient   Disposition Plan: Possible DC in a.m.  Time Spent in minutes  25 minutes  Procedures:  None  Consultants:   Cardiology  Antimicrobials:      Medications  Scheduled Meds: . allopurinol  300  mg Oral Daily  . ALPRAZolam  1 mg Oral QHS  . aspirin  81 mg Oral Daily  . atorvastatin  40 mg Oral q1800  . bisoprolol  5 mg Oral Daily  . clopidogrel  75 mg Oral Daily  . DULoxetine  60 mg Oral BID  . enoxaparin (LOVENOX) injection  40 mg Subcutaneous Q24H  . furosemide  80 mg Oral BID  . insulin aspart  0-9 Units Subcutaneous TID WC  . magnesium oxide  400 mg Oral BID  . potassium chloride  40 mEq Oral Once  . potassium chloride  40 mEq Oral BID  . sodium chloride flush  3 mL Intravenous Q12H  . traZODone  50 mg Oral QHS   Continuous Infusions: PRN Meds:.acetaminophen **OR** acetaminophen, HYDROcodone-acetaminophen, ipratropium-albuterol, nitroGLYCERIN, ondansetron **OR** ondansetron (ZOFRAN) IV   Antibiotics   Anti-infectives    None        Subjective:   Sydney Phillips was seen and examined today. Shortness of  breath is improving, no chest pain. No fevers or chills. Did not sleep well last night, feels tired today.  Patient denies dizziness, abdominal pain, N/V/D/C, new weakness, numbess, tingling.   Objective:   Vitals:   12/18/16 1946 12/19/16 0500 12/19/16 0835 12/19/16 0855  BP: (!) 142/63 (!) 140/57  (!) 157/63  Pulse: 82 86 90 86  Resp: (!) 22 (!) 28 (!) 22 16  Temp: 98.9 F (37.2 C) 98.5 F (36.9 C)  97.6 F (36.4 C)  TempSrc: Oral Oral  Oral  SpO2: 93% 92% 91%   Weight:  79.7 kg (175 lb 9.6 oz)    Height:        Intake/Output Summary (Last 24 hours) at 12/19/16 1137 Last data filed at 12/19/16 1000  Gross per 24 hour  Intake              480 ml  Output             7400 ml  Net            -6920 ml     Wt Readings from Last 3 Encounters:  12/19/16 79.7 kg (175 lb 9.6 oz)  10/07/16 76.7 kg (169 lb)  09/25/16 79.8 kg (176 lb)     Exam Physical Exam  General: Alert and oriented x 3, NAD  Eyes: PERRLA, EOMI, Anicteric Sclera,  HEENT:  Atraumatic, normocephalic, normal oropharynx, +JVD  Cardiovascular: S1 and S2 clear, RRR  Respiratory: Expiratory wheezing bilaterally  Gastrointestinal: Soft, nontender, nondistended, + bowel sounds  Ext: no pedal edema bilaterally  Neuro: no new FND  Musculoskeletal: No digital cyanosis, clubbing  Skin: No rashes  Psych: Normal affect and demeanor, alert and oriented x3     Data Reviewed:  I have personally reviewed following labs and imaging studies  Micro Results No results found for this or any previous visit (from the past 240 hour(s)).  Radiology Reports Dg Chest Port 1 View  Result Date: 12/17/2016 CLINICAL DATA:  Shortness of breath 2 days becoming worse. Weight gain. EXAM: PORTABLE CHEST 1 VIEW COMPARISON:  09/04/2016 FINDINGS: Left-sided pacemaker unchanged. Lungs are adequately inflated with mild prominence of the perihilar markings likely mild vascular congestion. No focal airspace consolidation. No definite  effusion. Moderate stable cardiomegaly. Calcified granuloma over the left midlung. Degenerative change of the spine. Minimal calcified plaque over the aortic arch. IMPRESSION: Stable moderate cardiomegaly with suggestion mild vascular congestion. Electronically Signed   By: Marin Olp M.D.   On: 12/17/2016 10:44  Lab Data:  CBC:  Recent Labs Lab 12/17/16 1030 12/18/16 0016  WBC 12.6* 9.6  HGB 13.7 11.8*  HCT 45.0 37.8  MCV 92.0 91.1  PLT 239 948   Basic Metabolic Panel:  Recent Labs Lab 12/17/16 1030 12/17/16 1400 12/18/16 0016 12/19/16 0736  NA 139 141 138 139  K 2.7* 2.4* 3.4* 4.7  CL 100* 101 99* 98*  CO2 28 28 28 31   GLUCOSE 188* 209* 293* 149*  BUN 10 9 20  24*  CREATININE 1.00 1.09* 1.32* 1.06*  CALCIUM 9.0 8.9 9.0 9.9   GFR: Estimated Creatinine Clearance: 46.1 mL/min (A) (by C-G formula based on SCr of 1.06 mg/dL (H)). Liver Function Tests: No results for input(s): AST, ALT, ALKPHOS, BILITOT, PROT, ALBUMIN in the last 168 hours. No results for input(s): LIPASE, AMYLASE in the last 168 hours. No results for input(s): AMMONIA in the last 168 hours. Coagulation Profile: No results for input(s): INR, PROTIME in the last 168 hours. Cardiac Enzymes:  Recent Labs Lab 12/17/16 1400 12/17/16 1801 12/18/16 0016  TROPONINI <0.03 <0.03 <0.03   BNP (last 3 results) No results for input(s): PROBNP in the last 8760 hours. HbA1C:  Recent Labs  12/18/16 0016  HGBA1C 6.1*   CBG:  Recent Labs Lab 12/18/16 0824 12/18/16 1146 12/18/16 1619 12/18/16 2055 12/19/16 0717  GLUCAP 136* 159* 112* 205* 150*   Lipid Profile:  Recent Labs  12/19/16 0736  CHOL 212*  HDL 47  LDLCALC 140*  TRIG 124  CHOLHDL 4.5   Thyroid Function Tests: No results for input(s): TSH, T4TOTAL, FREET4, T3FREE, THYROIDAB in the last 72 hours. Anemia Panel: No results for input(s): VITAMINB12, FOLATE, FERRITIN, TIBC, IRON, RETICCTPCT in the last 72 hours. Urine analysis:      Component Value Date/Time   COLORURINE YELLOW 12/17/2016 [place] 12/17/2016 1634   LABSPEC 1.008 12/17/2016 1634   PHURINE 6.0 12/17/2016 1634   GLUCOSEU NEGATIVE 12/17/2016 1634   HGBUR NEGATIVE 12/17/2016 1634   HGBUR negative 01/01/2010 [place] 12/17/2016 1634   BILIRUBINUR n 09/25/2016 [place] 12/17/2016 1634   PROTEINUR NEGATIVE 12/17/2016 1634   UROBILINOGEN 0.2 09/25/2016 1552   UROBILINOGEN 1.0 11/03/2014 1442   NITRITE NEGATIVE 12/17/2016 [place] 12/17/2016 1634     Ripudeep Rai M.D. [facility] 12/19/2016, 11:37 AM  Pager: [phone] Between 7am to 7pm - call Pager - [phone]  After 7pm go to [email or web address] - password [email or web address]  Call night coverage person covering after 7pm

## 2016-12-20 DIAGNOSIS — N183 Chronic kidney disease, stage 3 (moderate): Secondary | ICD-10-CM

## 2016-12-20 DIAGNOSIS — E1122 Type 2 diabetes mellitus with diabetic chronic kidney disease: Secondary | ICD-10-CM

## 2016-12-20 LAB — BASIC METABOLIC PANEL
Anion gap: 10 (ref 5–15)
BUN: 33 mg/dL — AB (ref 6–20)
CHLORIDE: 97 mmol/L — AB (ref 101–111)
CO2: 32 mmol/L (ref 22–32)
CREATININE: 1.42 mg/dL — AB (ref 0.44–1.00)
Calcium: 9.5 mg/dL (ref 8.9–10.3)
GFR calc Af Amer: 40 mL/min — ABNORMAL LOW (ref >60)
GFR calc non Af Amer: 34 mL/min — ABNORMAL LOW (ref >60)
Glucose, Bld: 100 mg/dL — ABNORMAL HIGH (ref 65–99)
Potassium: 5 mmol/L (ref 3.5–5.1)
SODIUM: 139 mmol/L (ref 135–145)

## 2016-12-20 LAB — GLUCOSE, CAPILLARY
Glucose-Capillary: 96 mg/dL (ref 65–99)
Glucose-Capillary: 161 mg/dL — ABNORMAL HIGH (ref 65–99)

## 2016-12-20 MED ORDER — FUROSEMIDE 80 MG PO TABS: 80.0000 mg | ORAL_TABLET | Freq: Two times a day (BID) | ORAL | 4 refills | Status: DC

## 2016-12-20 MED ORDER — IPRATROPIUM-ALBUTEROL 0.5-2.5 (3) MG/3ML IN SOLN
3.0000 mL | Freq: Four times a day (QID) | RESPIRATORY_TRACT | Status: DC | PRN
Start: 2016-12-20 — End: 2016-12-20

## 2016-12-20 NOTE — Discharge Summary (Signed)
Physician Discharge Summary   Patient ID: Sydney Phillips MRN: [id number] DOB/AGE: 79/04/1936 79 y.o.  Admit date: 12/17/2016 Discharge date: 12/20/2016  Primary Care Physician:  Laurey Morale, MD  Discharge Diagnoses:    . Acute on chronic combined systolic and diastolic CHF (congestive heart failure) ([place]) . Hypokalemia . Essential hypertension . COPD (chronic obstructive pulmonary disease) with emphysema ([place]) . Biventricular implantable cardioverter-defibrillator in situ . Diabetes mellitus type 2 . CAD   Acute renal insufficiency   Hyperlipidemia   Consults:  Cardiology  Recommendations for Outpatient Follow-up:  1. Lasix increased to 80 mg twice a day oral 2. Please repeat BMET next week  3. Hold lisinopril    DIET: Heart healthy, carb modify diet  Allergies:   Allergies  Allergen Reactions  . Potassium-Containing Compounds Other (See Comments)    Causes severe constipation  . Azithromycin Rash  . Codeine Itching  . Darvon Itching  . Erythromycin Rash  . Meloxicam Other (See Comments)    Unknown    . Norco [Hydrocodone-Acetaminophen] Itching  . Penicillins Rash and Other (See Comments)    Has patient had a PCN reaction causing immediate rash, facial/tongue/throat swelling, SOB or lightheadedness with hypotension: unknown Has patient had a PCN reaction causing severe rash involving mucus membranes or skin necrosis: no Has patient had a PCN reaction that required hospitalization: unknown Has patient had a PCN reaction occurring within the last 10 years: no If all of the above answers are "NO", then may proceed with Cephalosporin use.   Marland Kitchen Propoxyphene N-Acetaminophen Itching  . Rofecoxib Other (See Comments)    Unknown    . Rosuvastatin Other (See Comments)    cramps  . Statins Itching and Other (See Comments)    Sleeplessness  . Sulfa Antibiotics Rash     DISCHARGE MEDICATIONS: Current Discharge Medication List    CONTINUE these medications  which have CHANGED   Details  furosemide (LASIX) 80 MG tablet Take 1 tablet (80 mg total) by mouth 2 (two) times daily. Qty: 60 tablet, Refills: 4      CONTINUE these medications which have NOT CHANGED   Details  albuterol (PROVENTIL HFA;VENTOLIN HFA) 108 (90 Base) MCG/ACT inhaler Inhale 2 puffs into the lungs every 4 (four) hours as needed for wheezing or shortness of breath. Qty: 1 Inhaler, Refills: 5    allopurinol (ZYLOPRIM) 300 MG tablet TAKE ONE TABLET BY MOUTH ONCE DAILY Qty: 30 tablet, Refills: 11    ALPRAZolam (XANAX) 1 MG tablet Take 1 tablet (1 mg total) by mouth every 6 (six) hours as needed for anxiety. Qty: 120 tablet, Refills: 5    aspirin 81 MG chewable tablet Chew 1 tablet (81 mg total) by mouth daily. Qty: 30 tablet, Refills: 1    atorvastatin (LIPITOR) 40 MG tablet Take 1 tablet (40 mg total) by mouth daily at 6 PM. Qty: 90 tablet, Refills: 3    bisoprolol (ZEBETA) 5 MG tablet Take 1 tablet (5 mg total) by mouth daily. Qty: 90 tablet, Refills: 3    clopidogrel (PLAVIX) 75 MG tablet Take 1 tablet (75 mg total) by mouth daily. Qty: 60 tablet, Refills: 11    DULoxetine (CYMBALTA) 60 MG capsule Take 60 mg by mouth 2 (two) times daily.    fluticasone (FLONASE) 50 MCG/ACT nasal spray Place 1 spray into both nostrils daily as needed for allergies.     HYDROcodone-acetaminophen (NORCO) 10-325 MG tablet Take one tablet by mouth every 6 hours as needed for pain. DNE 3gm  of APAP from all sources/24hours Qty: 120 tablet, Refills: 0    Magnesium Oxide 400 MG CAPS Take 1 capsule (400 mg total) by mouth 2 (two) times daily. Qty: 180 capsule, Refills: 3    nitroGLYCERIN (NITROSTAT) 0.4 MG SL tablet Place 0.4 mg under the tongue every 5 (five) minutes as needed for chest pain. X 3 doses    potassium chloride SA (K-DUR,KLOR-CON) 20 MEQ tablet Take 1 tablet (20 mEq total) by mouth 2 (two) times daily. Qty: 180 tablet, Refills: 3    traZODone (DESYREL) 50 MG tablet Take 1  tablet (50 mg total) by mouth at bedtime. Qty: 30 tablet, Refills: 5      STOP taking these medications     lisinopril (PRINIVIL,ZESTRIL) 5 MG tablet          Brief H and P: For complete details please refer to admission H and P, but in brief Patient is a 81 year old female with CAD S/P two stens, chronic combined CHF (EF 20-25% per last echo in July 2018), CKD 3, COPD with chronic hypoxic respiratory failure (she is on 2L [place] at home), "borderline" Type 2 Diabetes, HTN, HLD, PAD S/P fempop bypass to right leg, and AICD/PPM who presents to the ED for evaluation of chest tightness, shortness of breath, and 10lb weight gain in the last 3 days prior to admission. No lower extremity swelling. Typically wears home O2 2 L however had increased to 4 L over the past 2 days. In ED, was placed on BiPAP for tachypnea and O2 sats in the 80s.   Hospital Course:  Acute on chronic hypoxic respiratory failure secondary to Acute on chronic combined systolic and diastolic CHF (congestive heart failure) ([place]) - Patient presented with hypoxia, had increased her oxygen to 4 L at home and was placed on BiPAP for tachypnea and hypoxia. She has improved, currently back to baseline with O2 sats 97% on 2 L.  - Patient was placed on aggressive IV diuresis, (at home takes 80 mg and 120 mg every other day alternating) - Cardiology was consulted. Patient is now transitioned to oral Lasix 80 mg twice a day - Negative balance of 8.8 L - I discussed in detail with Dr. Koneswaran/cardiology prior to discharge, as creatinine has trended up to 1.4, recommended to continue Lasix 80 mg twice a day for now, close follow-up and labs, which cardiology/he will arrange. Will hold lisinopril for now. Okay to discharge home today.      Essential hypertension - BP stable 127/94 at the time of discharge. Continue Lasix, beta blocker    COPD (chronic obstructive pulmonary disease) with emphysema (HCC) - Improved, continue  albuterol    Hypokalemia - Resolved    Biventricular implantable cardioverter-defibrillator in situ - ICD interrogated, no significant arrhythmia since last device check    Diabetes mellitus ([place]), type II with history of CAD - Hemoglobin A1c 6.1, improving from previous hemoglobin A1c 7.2 and 4/18 - Diet controlled, improving, patient was placed on sliding scale insulin while inpatient  Coronary artery disease - Cardiac cath in 12/17 showed severe 2 vessel CAD of RCA and left circumflex treated successfully with PCI in each vessel - Continue aspirin, Plavix, beta blocker, statin.  - Continue to hold lisinopril  Acute renal insufficiency - likely due to diuresis, continue to hold ACE inhibitor for now  Hyperlipidemia - LDL 140, continue statin   Day of Discharge BP (!) 127/94 (BP Location: Right Arm)   Pulse 77   Temp 97.8 F (  36.6 C) (Oral)   Resp 16   Ht 5' 5.5" (1.664 m)   Wt 77.8 kg (171 lb 9.6 oz)   SpO2 97%   BMI 28.12 kg/m   Physical Exam: General: Alert and awake oriented x3 not in any acute distress. HEENT: anicteric sclera, pupils reactive to light and accommodation CVS: S1-S2 clear no murmur rubs or gallops Chest: clear to auscultation bilaterally, no wheezing rales or rhonchi Abdomen: soft nontender, nondistended, normal bowel sounds Extremities: no cyanosis, clubbing or edema noted bilaterally Neuro: Cranial nerves II-XII intact, no focal neurological deficits   The results of significant diagnostics from this hospitalization (including imaging, microbiology, ancillary and laboratory) are listed below for reference.    LAB RESULTS: Basic Metabolic Panel:  Recent Labs Lab 12/19/16 0736 12/20/16 0651  NA 139 139  K 4.7 5.0  CL 98* 97*  CO2 31 32  GLUCOSE 149* 100*  BUN 24* 33*  CREATININE 1.06* 1.42*  CALCIUM 9.9 9.5   Liver Function Tests: No results for input(s): AST, ALT, ALKPHOS, BILITOT, PROT, ALBUMIN in the last 168 hours. No  results for input(s): LIPASE, AMYLASE in the last 168 hours. No results for input(s): AMMONIA in the last 168 hours. CBC:  Recent Labs Lab 12/17/16 1030 12/18/16 0016  WBC 12.6* 9.6  HGB 13.7 11.8*  HCT 45.0 37.8  MCV 92.0 91.1  PLT 239 241   Cardiac Enzymes:  Recent Labs Lab 12/17/16 1801 12/18/16 0016  TROPONINI <0.03 <0.03   BNP: Invalid input(s): POCBNP CBG:  Recent Labs Lab 12/20/16 0741 12/20/16 1129  GLUCAP 96 161*    Significant Diagnostic Studies:  Dg Chest Port 1 View  Result Date: 12/17/2016 CLINICAL DATA:  Shortness of breath 2 days becoming worse. Weight gain. EXAM: PORTABLE CHEST 1 VIEW COMPARISON:  09/04/2016 FINDINGS: Left-sided pacemaker unchanged. Lungs are adequately inflated with mild prominence of the perihilar markings likely mild vascular congestion. No focal airspace consolidation. No definite effusion. Moderate stable cardiomegaly. Calcified granuloma over the left midlung. Degenerative change of the spine. Minimal calcified plaque over the aortic arch. IMPRESSION: Stable moderate cardiomegaly with suggestion mild vascular congestion. Electronically Signed   By: Marin Olp M.D.   On: 12/17/2016 10:44    2D ECHO:  Study Conclusions  - Left ventricle: Diffuse hypokinesis worse in inferior wall. The   cavity size was severely dilated. Wall thickness was normal.   Systolic function was severely reduced. The estimated ejection   fraction was in the range of 20% to 25%. Diffuse hypokinesis. - Aortic valve: May have some degree of AS masked by poor LV   function but not severe. - Mitral valve: There was moderate regurgitation. Valve area by   pressure half-time: 1.49 cm^2. - Left atrium: The atrium was mildly dilated. - Atrial septum: No defect or patent foramen ovale was identified. - Pulmonary arteries: PA peak pressure: 52 mm Hg (S). - Line: A venous catheter was visualized in the superior vena cava,   with its tip in the right atrium. No  abnormal features noted.   Disposition and Follow-up: Discharge Instructions    (HEART FAILURE PATIENTS) Call MD:  Anytime you have any of the following symptoms: 1) 3 pound weight gain in 24 hours or 5 pounds in 1 week 2) shortness of breath, with or without a dry hacking cough 3) swelling in the hands, feet or stomach 4) if you have to sleep on extra pillows at night in order to breathe.    Complete by:  As directed    Diet Carb Modified    Complete by:  As directed    Increase activity slowly    Complete by:  As directed        DISPOSITION: home   [place]    Almyra Deforest, PA Follow up on 01/02/2017.   Specialties:  Cardiology, Radiology Why:  8:30 AM for TCM and hospital follow up Please call on Monday for labs next week for potassium and renal function  Contact information: [address] [place] [address] [phone]        Laurey Morale, MD. Schedule an appointment as soon as possible for a visit in 2 week(s).   Specialty:  Family Medicine Why:  for hospital follow-up Contact information: [place] [address] [phone]            Time spent on Discharge: 35 mins   Signed:   Estill Cotta M.D. [facility] 12/20/2016, 11:58 AM Pager: [phone]

## 2016-12-22 ENCOUNTER — Telehealth: Payer: Self-pay | Admitting: *Deleted

## 2016-12-22 NOTE — Telephone Encounter (Signed)
D/C 12/20/2016  DX: CHF    Transition Care Management Follow-up Telephone Call  How have you been since you were released from the hospital? "I've been feeling great! I was able to go grocery shopping this morning. I have also quit smoking"   *Do you understand why you were in the hospital? "Yes, I wasn't breathing right    at home. It kept getting worse so I went to the hospital. I had too much fluid on my    body which caused my CHF to flare up"   Do you understand the discharge instrcutions? "Yes, I am taking my medications like I was told and weighing myself daily" Instructed patient on keeping log of daily weights and to report any weight gain of 2lbs in one day or 5lbs in one week; understanding voiced.    Items Reviewed:  Medications reviewed: Yes, lisinopril was discontinued, lasix was increased to Lasix 80mg  one tablet twice daily; reviewed medication changes including dosing, frequency, and side effects; understanding voiced.   Allergies reviewed: Yes, no changes  Dietary changes reviewed: Yes, reviewed heart healthy diet choices  Referrals reviewed: N/A}   Functional Questionnaire:   Activities of Daily Living (ADLs):   She states they are independent in the following: ambulation, bathing and hygiene, feeding, continence, grooming, toileting and dressing States they require assistance with the following: None   Any transportation issues/concerns?: No, patient lives with son who transports her where needed.    Any patient concerns? None   Confirmed importance and date/time of follow-up visits scheduled: PCP follow up on 12/24/16 at 215pm; patient has cardiology appt on 01/02/17; reviewed both appointments with patient and instructed on importance of keeping appointments to prevent re-hospitalization; patient voiced understanding  Confirmed with patient if condition begins to worsen call PCP or go to the ER.  Patient was given the Call-a-Nurse line [phone]:   Educated patient on s/s to report to EMS; patient voiced understanding.

## 2016-12-23 ENCOUNTER — Telehealth: Payer: Self-pay | Admitting: Cardiology

## 2016-12-23 NOTE — Telephone Encounter (Signed)
LMOVM reminding pt to send remote transmission.   

## 2016-12-24 ENCOUNTER — Encounter: Payer: Self-pay | Admitting: Family Medicine

## 2016-12-24 ENCOUNTER — Ambulatory Visit (INDEPENDENT_AMBULATORY_CARE_PROVIDER_SITE_OTHER): Payer: Medicare Other | Admitting: Family Medicine

## 2016-12-24 VITALS — BP 105/67 | HR 77 | Temp 97.7°F | Ht 65.5 in | Wt 171.0 lb

## 2016-12-24 DIAGNOSIS — I255 Ischemic cardiomyopathy: Secondary | ICD-10-CM

## 2016-12-24 DIAGNOSIS — I5042 Chronic combined systolic (congestive) and diastolic (congestive) heart failure: Secondary | ICD-10-CM

## 2016-12-24 DIAGNOSIS — N183 Chronic kidney disease, stage 3 unspecified: Secondary | ICD-10-CM

## 2016-12-24 DIAGNOSIS — I272 Pulmonary hypertension, unspecified: Secondary | ICD-10-CM | POA: Diagnosis not present

## 2016-12-24 LAB — BASIC METABOLIC PANEL
BUN: 54 mg/dL — ABNORMAL HIGH (ref 6–23)
CHLORIDE: 95 meq/L — AB (ref 96–112)
CO2: 31 meq/L (ref 19–32)
CREATININE: 1.51 mg/dL — AB (ref 0.40–1.20)
Calcium: 10.4 mg/dL (ref 8.4–10.5)
GFR: 35.34 mL/min — ABNORMAL LOW (ref >60.00)
GLUCOSE: 94 mg/dL (ref 70–99)
Potassium: 5.4 mEq/L — ABNORMAL HIGH (ref 3.5–5.1)
Sodium: 135 mEq/L (ref 135–145)

## 2016-12-24 MED ORDER — FLUTICASONE PROPIONATE 50 MCG/ACT NA SUSP: 2.0000 | Freq: Every day | NASAL | 11 refills | Status: DC | PRN

## 2016-12-24 MED ORDER — HYDROCODONE-ACETAMINOPHEN 10-325 MG PO TABS: ORAL_TABLET | ORAL | 0 refills | Status: DC

## 2016-12-24 NOTE — Patient Instructions (Signed)
WE NOW OFFER   [facility] FAST TRACK!!!  SAME DAY Appointments for ACUTE CARE  Such as: Sprains, Injuries, cuts, abrasions, rashes, muscle pain, joint pain, back pain Colds, flu, sore throats, headache, allergies, cough, fever  Ear pain, sinus and eye infections Abdominal pain, nausea, vomiting, diarrhea, upset stomach Animal/insect bites  3 Easy Ways to Schedule: Walk-In Scheduling Call in scheduling Mychart Sign-up: [email or web address]         

## 2016-12-24 NOTE — Progress Notes (Signed)
   Subjective:    Patient ID: Sydney Phillips, female    DOB: Oct 20, 1935, 81 y.o.   MRN: [id number]  HPI Here for a transitional care follow up of a hospital stay from 12-17-16 to 12-24-16 for an acute on chronic flare of combined systolic and diastolic CHF. We reviewed laa her notes and test results together today. She was diuresed and lost 10 lbs of fluid. Her potassium dropped to 2.4 but was up to 4.7 by the time of DC. Her renal status was stable with a DC creatinine of 1.06. Her EF was 20-25%. She was sent home on Lasix 80 mg BID, and she has felt well since going home. No leg or ankle swelling and very little SOB. She weighs herself carefully every day and this has been stable. She drinks water most of the time and limits herself to one soda a day.    Review of Systems  Constitutional: Negative.   Respiratory: Negative.   Cardiovascular: Negative.   Gastrointestinal: Negative.   Neurological: Negative.        Objective:   Physical Exam  Constitutional: She is oriented to person, place, and time. She appears well-developed and well-nourished.  Cardiovascular: Normal rate, regular rhythm, normal heart sounds and intact distal pulses.   Pulmonary/Chest: Breath sounds normal. She is in respiratory distress. She has no wheezes. She has no rales.  Abdominal: Soft. Bowel sounds are normal. She exhibits no distension. There is no tenderness. There is no rebound.  Musculoskeletal: She exhibits no edema.  Neurological: She is alert and oriented to person, place, and time.          Assessment & Plan:  Her CHF has stabilized along with her HTN. Her current dose of 160 mg of Lasix a day is appropriate. We will send her for a BMET today to follow the potassium and renal function. She follows up with Cardiology on 01-02-17. She is to stay off all ACE's and ARB's.  Alysia Penna, MD

## 2017-01-02 ENCOUNTER — Ambulatory Visit (INDEPENDENT_AMBULATORY_CARE_PROVIDER_SITE_OTHER): Payer: Medicare Other | Admitting: *Deleted

## 2017-01-02 ENCOUNTER — Ambulatory Visit (INDEPENDENT_AMBULATORY_CARE_PROVIDER_SITE_OTHER): Payer: Medicare Other | Admitting: Physician Assistant

## 2017-01-02 ENCOUNTER — Encounter: Payer: Self-pay | Admitting: Physician Assistant

## 2017-01-02 VITALS — BP 121/75 | HR 96 | Ht 65.5 in | Wt 169.2 lb

## 2017-01-02 DIAGNOSIS — Z79899 Other long term (current) drug therapy: Secondary | ICD-10-CM

## 2017-01-02 DIAGNOSIS — I2510 Atherosclerotic heart disease of native coronary artery without angina pectoris: Secondary | ICD-10-CM

## 2017-01-02 DIAGNOSIS — I5042 Chronic combined systolic (congestive) and diastolic (congestive) heart failure: Secondary | ICD-10-CM

## 2017-01-02 DIAGNOSIS — E119 Type 2 diabetes mellitus without complications: Secondary | ICD-10-CM

## 2017-01-02 DIAGNOSIS — I10 Essential (primary) hypertension: Secondary | ICD-10-CM | POA: Diagnosis not present

## 2017-01-02 DIAGNOSIS — K219 Gastro-esophageal reflux disease without esophagitis: Secondary | ICD-10-CM | POA: Diagnosis not present

## 2017-01-02 DIAGNOSIS — Z95810 Presence of automatic (implantable) cardiac defibrillator: Secondary | ICD-10-CM | POA: Diagnosis not present

## 2017-01-02 DIAGNOSIS — N183 Chronic kidney disease, stage 3 unspecified: Secondary | ICD-10-CM

## 2017-01-02 DIAGNOSIS — Z720 Tobacco use: Secondary | ICD-10-CM | POA: Diagnosis not present

## 2017-01-02 DIAGNOSIS — I420 Dilated cardiomyopathy: Secondary | ICD-10-CM | POA: Diagnosis not present

## 2017-01-02 DIAGNOSIS — I255 Ischemic cardiomyopathy: Secondary | ICD-10-CM | POA: Diagnosis not present

## 2017-01-02 DIAGNOSIS — N289 Disorder of kidney and ureter, unspecified: Secondary | ICD-10-CM | POA: Diagnosis not present

## 2017-01-02 DIAGNOSIS — I447 Left bundle-branch block, unspecified: Secondary | ICD-10-CM

## 2017-01-02 LAB — BASIC METABOLIC PANEL
BUN/Creatinine Ratio: 30 — ABNORMAL HIGH (ref 12–28)
BUN: 48 mg/dL — AB (ref 8–27)
CHLORIDE: 94 mmol/L — AB (ref 96–106)
CO2: 25 mmol/L (ref 20–29)
Calcium: 10.5 mg/dL — ABNORMAL HIGH (ref 8.7–10.3)
Creatinine, Ser: 1.61 mg/dL — ABNORMAL HIGH (ref 0.57–1.00)
GFR calc non Af Amer: 30 mL/min/{1.73_m2} — ABNORMAL LOW (ref >59)
GFR, EST AFRICAN AMERICAN: 35 mL/min/{1.73_m2} — AB (ref >59)
GLUCOSE: 107 mg/dL — AB (ref 65–99)
POTASSIUM: 5.5 mmol/L — AB (ref 3.5–5.2)
Sodium: 138 mmol/L (ref 134–144)

## 2017-01-02 MED ORDER — FUROSEMIDE 80 MG PO TABS: ORAL_TABLET | ORAL | 4 refills | Status: DC

## 2017-01-02 NOTE — Progress Notes (Signed)
Cardiology Office Note    Date:  01/03/2017   ID:  Sydney Phillips, Mow [birth year], MRN [id number]  PCP:  Laurey Morale, MD  Cardiologist:  Dr. Sallyanne Kuster   Chief Complaint  Patient presents with  . Follow-up    seen for Dr. Sallyanne Kuster    History of Present Illness:  LORECE KEACH is a 81 y.o. 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 He was diagnosed with ejection fraction of 20% in 2009. This improved to 50% in 2013 with CRT. Echocardiogram in September 2017 showed EF 40-45%. Unfortunately, she had an episode of NSTEMI in the setting of COPD exacerbation. Left and right heart cath performed on 05/20/2016 showed severe two-vessel disease of RCA and left circumflex treated with DES, moderate ostial LAD stenosis, vascular congestion with elevated wedge pressure, moderate pulmonary hypertension. Echocardiogram in January 2018 showed EF 20-25% with severely dilated left atrium, moderate MR, PA peak pressure 53 mmHg. Repeat echocardiogram on 12/18/2016 continued to show EF 25%, moderate MR, unable to assess the degree of aortic valve disease, PA peak pressure 52 mmHg.  Recently patient was admitted with acute on chronic systolic heart failure after a weight gain of 10 pounds. Chest x-ray showed mild vascular congestion. BNP was 1060. Serial troponin negative. Device interrogation also indicate the need for generator change in the next 2-3 mouth. Her target weight is less than 170 pounds. She was eventually discharged on 80 mg twice a day of Lasix.  She presents today to cardiology service. Her lisinopril has been stopped on recent admission due to acute kidney injury. Her primary care physician actually rechecked a basic metabolic panel, her creatinine went up to 1.5 on the last check. She was also hyperkalemic as well. I will hold off on restarting lisinopril after her renal function normalized. I will obtain a basic  metabolic panel today. I have also called our device clinic since she send over a remote transmission yesterday. I was told that she has been volume overloaded for several months and now she is actually very dry. This is consistent with my physical exam that she appears to be dehydrated. She used to be on alternating 80 and 120 mg daily of Lasix. She is currently on 80 mg twice a day of Lasix. I will reduce this dose to 80 mg in a.m. and 40 mg in p.m. She will see Dr. Sallyanne Kuster next month for consideration of battery change. Otherwise she has been doing well since she left the hospital. She is breathing better. She is living with her son who is her caretaker. She continued to smoke, we did discuss tobacco cessation. On physical exam, she has intermittent crackles in bilateral bases.   Past Medical History:  Diagnosis Date  . Arthritis   . Automatic implantable cardioverter-defibrillator in situ   . Bronchitis   . CAD (coronary artery disease)    a. s/p DES to LCx/RCA 05/2016, ostial LAD disease.  . Cardiomyopathy ([place])   . Chronic back pain   . Chronic combined systolic and diastolic CHF (congestive heart failure) ([place])   . Chronic constipation   . Chronic pain   . CKD (chronic kidney disease), stage III   . Colon polyps 2003.  2015.   HP polyps 2003.  adnomas 2015.  required referal to [facility] for colonoscopic resection of flat polyps.   Marland Kitchen COPD (chronic obstructive pulmonary disease) ([place])   . Dementia   .  Depression with anxiety    takes Cymbalta daily  . Diabetes mellitus ([place])   . Early cataracts, bilateral   . Fibromyalgia   . GERD (gastroesophageal reflux disease)    was on meds but was taken off;now watches what she eats  . Hemorrhoids   . History of kidney stones   . Hx of colonic polyps   . Hyperlipemia    takes Crestor daily  . Hypertension    takes Amlodipine and Metoprolol daily  . Insomnia   . LBBB (left bundle branch block)    Stress test 09/03/2010, EF 55  .  Myocardial infarction ([place])   . PAD (peripheral artery disease) ([facility])    Carotid, subclavian, and lower extremity beds, currently not symptomatic  . Presence of combination internal cardiac defibrillator (ICD) and pacemaker   . Presence of permanent cardiac pacemaker   . Pulmonary hypertension ([place])   . S/P angioplasty with stent, lt. subclavian 07/31/11 08/01/2011  . Subclavian arterial stenosis, lt, with PTA/STENT 07/31/11 08/01/2011  . Syncope 07/28/2011   EF - 50-55, moderate concentric hypertrophy in left ventricle  . Urinary incontinence   . Vertigo    takes Meclizine prn    Past Surgical History:  Procedure Laterality Date  . ABDOMINAL AORTAGRAM N/A 08/12/2013   Procedure: ABDOMINAL Maxcine Ham;  Surgeon: Elam Dutch, MD;  Location: [facility] C[facility] LAB;  Service: Cardiovascular;  Laterality: N/A;  . ABDOMINAL HYSTERECTOMY    . APPENDECTOMY    . BACK SURGERY  2012  . BIV ICD GENERTAOR CHANGE OUT Left 02/20/2012   Procedure: BIV ICD GENERTAOR CHANGE OUT;  Surgeon: [name], MD;  Location: [facility] CATH LAB;  Service: Cardiovascular;  Laterality: Left;  . CARDIAC CATHETERIZATION  12/01/2007   By Dr. Melvern Banker, left heart cath,   . CARDIAC CATHETERIZATION N/A 05/20/2016   Procedure: Right/Left Heart Cath and Coronary Angiography;  Surgeon: Sherren Mocha, MD;  Location: [place] CV LAB;  Service: Cardiovascular;  Laterality: N/A;  . CARDIAC CATHETERIZATION N/A 05/20/2016   Procedure: Coronary Stent Intervention;  Surgeon: Sherren Mocha, MD;  Location: [place] CV LAB;  Service: Cardiovascular;  Laterality: N/A;  . CARDIAC DEFIBRILLATOR PLACEMENT  05/2008   By Dr Blanch Media, [employer] CANNOT HAVE MRI's  . CAROTID ANGIOGRAM N/A 07/31/2011   Procedure: CAROTID ANGIOGRAM;  Surgeon: Lorretta Harp, MD;  Location: [facility] CATH LAB;  Service: Cardiovascular;  Laterality: N/A;  carotid angiogram and possible Lt SCA PTA  . COLONOSCOPY W/ POLYPECTOMY  12/2013  . CORONARY ANGIOPLASTY    . ENDARTERECTOMY Left  11/08/2014   Procedure: LEFT CAROTID ENDARTERECTOMY WITH HEMASHIELD PATCH ANGIOPLASTY;  Surgeon: Elam Dutch, MD;  Location: [place];  Service: Vascular;  Laterality: Left;  . ESOPHAGOGASTRODUODENOSCOPY N/A 01/20/2014   Procedure: ESOPHAGOGASTRODUODENOSCOPY (EGD);  Surgeon: Jerene Bears, MD;  Location[facility] ENDOSCOPY;  Service: Endoscopy;  Laterality: N/A;  . FEMORAL-POPLITEAL BYPASS GRAFT Right 10/12/2013   Procedure:   Femoral-Peroneal trunk  bypass with nonreversed greater saphenous vein graft;  Surgeon: Elam Dutch, MD;  Location: [place];  Service: Vascular;  Laterality: Right;  . GIVENS CAPSULE STUDY N/A 01/20/2014   Procedure: GIVENS CAPSULE STUDY;  Surgeon: Jerene Bears, MD;  Location: [place];  Service: Gastroenterology;  Laterality: N/A;  . INTRAOPERATIVE ARTERIOGRAM Right 10/12/2013   Procedure: INTRA OPERATIVE ARTERIOGRAM;  Surgeon: Elam Dutch, MD;  Location: [place];  Service: Vascular;  Laterality: Right;  . ORIF ELBOW FRACTURE  08/16/2011   Procedure: OPEN REDUCTION INTERNAL FIXATION (ORIF)  ELBOW/OLECRANON FRACTURE;  Surgeon: Schuyler Amor, MD;  Location: [place];  Service: Orthopedics;  Laterality: Left;  . RENAL ANGIOGRAM N/A 08/12/2013   Procedure: RENAL ANGIOGRAM;  Surgeon: Elam Dutch, MD;  Location: [facility] CATH LAB;  Service: Cardiovascular;  Laterality: N/A;  . SUBCLAVIAN STENT PLACEMENT Left 07/31/2011   7x18 Genesis, balloon, with reduction of 90% ostial left subclavian artery stenosis to 0% with residual excellent flow  . TONSILLECTOMY    . TUBAL LIGATION      Current Medications: Outpatient Medications Prior to Visit  Medication Sig Dispense Refill  . albuterol (PROVENTIL HFA;VENTOLIN HFA) 108 (90 Base) MCG/ACT inhaler Inhale 2 puffs into the lungs every 4 (four) hours as needed for wheezing or shortness of breath. 1 Inhaler 5  . allopurinol (ZYLOPRIM) 300 MG tablet TAKE ONE TABLET BY MOUTH ONCE DAILY 30 tablet 11  . ALPRAZolam (XANAX) 1 MG tablet Take 1  tablet (1 mg total) by mouth every 6 (six) hours as needed for anxiety. (Patient taking differently: Take 1 mg by mouth at bedtime. ) 120 tablet 5  . aspirin 81 MG chewable tablet Chew 1 tablet (81 mg total) by mouth daily. 30 tablet 1  . atorvastatin (LIPITOR) 40 MG tablet Take 1 tablet (40 mg total) by mouth daily at 6 PM. 90 tablet 3  . bisoprolol (ZEBETA) 5 MG tablet Take 1 tablet (5 mg total) by mouth daily. 90 tablet 3  . clopidogrel (PLAVIX) 75 MG tablet Take 1 tablet (75 mg total) by mouth daily. 60 tablet 11  . DULoxetine (CYMBALTA) 60 MG capsule Take 60 mg by mouth 2 (two) times daily.    . fluticasone (FLONASE) 50 MCG/ACT nasal spray Place 2 sprays into both nostrils daily as needed for allergies. 16 g 11  . HYDROcodone-acetaminophen (NORCO) 10-325 MG tablet Take one tablet by mouth every 6 hours as needed for pain. DNE 3gm of APAP from all sources/24hours 120 tablet 0  . Magnesium Oxide 400 MG CAPS Take 1 capsule (400 mg total) by mouth 2 (two) times daily. 180 capsule 3  . nitroGLYCERIN (NITROSTAT) 0.4 MG SL tablet Place 0.4 mg under the tongue every 5 (five) minutes as needed for chest pain. X 3 doses    . potassium chloride SA (K-DUR,KLOR-CON) 20 MEQ tablet Take 1 tablet (20 mEq total) by mouth 2 (two) times daily. 180 tablet 3  . traZODone (DESYREL) 50 MG tablet Take 1 tablet (50 mg total) by mouth at bedtime. 30 tablet 5  . furosemide (LASIX) 80 MG tablet Take 1 tablet (80 mg total) by mouth 2 (two) times daily. 60 tablet 4   No facility-administered medications prior to visit.      Allergies:   Potassium-containing compounds; Azithromycin; Codeine; Darvon; Erythromycin; Meloxicam; Norco [hydrocodone-acetaminophen]; Penicillins; Propoxyphene n-acetaminophen; Rofecoxib; Rosuvastatin; Statins; and Sulfa antibiotics   Social History   Social History  . Marital status: Widowed    Spouse name: N/A  . Number of children: 3  . Years of education: 12   Occupational History  .  Retired    Social History Main Topics  . Smoking status: Current Every Day Smoker    Packs/day: 0.50    Years: 62.00    Types: Cigarettes  . Smokeless tobacco: Never Used     Comment: Down to 3-4 cigarettes per day  . Alcohol use No  . Drug use: No  . Sexual activity: No   Other Topics Concern  . None   Social History Narrative  Ok to share information with medical POA, Son Gabriel Carina   Right-handed   Caffeine: Pepsi     Family History:  The patient's family history includes CAD in her father; Cancer in her sister; Colon cancer in her maternal grandmother; Deep vein thrombosis in her son; Diabetes in her sister; Heart disease in her father, mother, and sister; Hyperlipidemia in her father and unknown relative.   ROS:   Please see the history of present illness.    ROS All other systems reviewed and are negative.   PHYSICAL EXAM:   VS:  BP 121/75   Pulse 96   Ht 5' 5.5" (1.664 m)   Wt 169 lb 3.2 oz (76.7 kg)   SpO2 96%   BMI 27.73 kg/m    GEN: Well nourished, well developed, in no acute distress  HEENT: normal  Neck: no JVD, carotid bruits, or masses Cardiac: RRR; no murmurs, rubs, or gallops,no edema  Respiratory: normal work of breathing +diminished breath sound bilaterally. GI: soft, nontender, nondistended, + BS MS: no deformity or atrophy  Skin: warm and dry, no rash Neuro:  Alert and Oriented x 3, Strength and sensation are intact Psych: euthymic mood, full affect  Wt Readings from Last 3 Encounters:  01/02/17 169 lb 3.2 oz (76.7 kg)  12/24/16 171 lb (77.6 kg)  12/20/16 171 lb 9.6 oz (77.8 kg)      Studies/Labs Reviewed:   EKG:  EKG is not ordered today.    Recent Labs: 05/14/2016: TSH 1.013 08/07/2016: Magnesium 1.9 09/25/2016: ALT 16 12/17/2016: B Natriuretic Peptide 1,060.6 12/18/2016: Hemoglobin 11.8; Platelets 241 12/24/2016: BUN 54; Creatinine, Ser 1.51; Potassium 5.4; Sodium 135   Lipid Panel    Component Value Date/Time   CHOL 212 (H)  12/19/2016 0736   TRIG 124 12/19/2016 0736   HDL 47 12/19/2016 0736   CHOLHDL 4.5 12/19/2016 0736   VLDL 25 12/19/2016 0736   LDLCALC 140 (H) 12/19/2016 0736   LDLDIRECT 152.0 05/30/2015 1452    Additional studies/ records that were reviewed today include:   Cath 05/20/2016 Conclusion   1. Severe 2 vessel CAD of the RCA and LCx, treated successfully with drug-eluting stents in each vessel 2. Moderate ostial LAD stenosis 3. Congestive heart failure with elevated PCWP/LVEDP 4. Moderate pulmonary HTN likely related to left heart disease (PVR 2 [place] units)  DAPT with ASA and Brilinta 12 months - consider [facility] Clinical Trial. Gentle fluids x 4 hours, likely further diuresis tomorrow if renal function stable.       Echo 12/18/2016 LV EF: 20% -   25%  Study Conclusions  - Left ventricle: Diffuse hypokinesis worse in inferior wall. The   cavity size was severely dilated. Wall thickness was normal.   Systolic function was severely reduced. The estimated ejection   fraction was in the range of 20% to 25%. Diffuse hypokinesis. - Aortic valve: May have some degree of AS masked by poor LV   function but not severe. - Mitral valve: There was moderate regurgitation. Valve area by   pressure half-time: 1.49 cm^2. - Left atrium: The atrium was mildly dilated. - Atrial septum: No defect or patent foramen ovale was identified. - Pulmonary arteries: PA peak pressure: 52 mm Hg (S). - Line: A venous catheter was visualized in the superior vena cava,   with its tip in the right atrium. No abnormal features noted.    ASSESSMENT:    1. Dilated cardiomyopathy ([place])   2. Encounter for long-term (current) use of  medications   3. CKD (chronic kidney disease), stage III   4. Essential hypertension   5. Coronary artery disease involving native coronary artery of native heart without angina pectoris   6. Controlled type 2 diabetes mellitus without complication, without long-term current  use of insulin ([place])   7. LBBB (left bundle branch block)   8. Gastroesophageal reflux disease without esophagitis   9. Tobacco abuse      PLAN:  In order of problems listed above:  1. Chronic combined systolic and diastolic heart failure: Currently appears to be dry, used to be on alternating 80 mg and 120 mg daily of Lasix. She was recently admitted for heart failure, Lasix increased to 80 mg twice a day. Yesterday's remote transmission x-ray shows she is very dry consistent with physical exam. I have decreased Lasix to 80 mg in a.m. and 40 mg in p.m. Her device battery will need to be changed in the next few month, she has a follow-up with Dr. Sallyanne Kuster in one month. We did discuss that as the device approached ERI, she likely will feel vibration from the device as a warning.  2. CKD stage III: Renal function worsened since discharge, I will obtain repeat basic metabolic panel today. She appears to be July. I will hold off on restarting lisinopril until renal function improve.  3. Hypertension: Lisinopril was recently discontinued, this will need to be restarted, however I will hold off until after her renal function improved.  4. CAD: No obvious angina. Last PCI December 2017.  5. DM 2: Manage her primary care provider, most recent hemoglobin A1c went down to his 6.1 which put her in prediabetes range. She is no longer on any diabetic medications  6. Tobacco abuse: Discussed the need for tobacco cessation    Medication Adjustments/Labs and Tests Ordered: Current medicines are reviewed at length with the patient today.  Concerns regarding medicines are outlined above.  Medication changes, Labs and Tests ordered today are listed in the Patient Instructions below. Patient Instructions  Medication Instructions:   REDUCE your Lasix dose - take 80mg  in the morning, and 40mg  in the evening.  Labwork:   BMET today. (non-fasting test)  Testing/Procedures:  none  Follow-Up:  With  Dr. Sallyanne Kuster as scheduled for discussion of pacemaker battery change-out.   If you need a refill on your cardiac medications before your next appointment, please call your pharmacy.      Hilbert Corrigan, [place]  01/03/2017 11:48 PM    [facility] [address], [place], [place]  [address] Phone: [phone]; Fax: [phone]

## 2017-01-02 NOTE — Progress Notes (Signed)
error 

## 2017-01-02 NOTE — Patient Instructions (Addendum)
Medication Instructions:   REDUCE your Lasix dose - take 80mg  in the morning, and 40mg  in the evening.  Labwork:   BMET today. (non-fasting test)  Testing/Procedures:  none  Follow-Up:  With Dr. Sallyanne Kuster as scheduled for discussion of pacemaker battery change-out.   If you need a refill on your cardiac medications before your next appointment, please call your pharmacy.

## 2017-01-02 NOTE — Progress Notes (Signed)
ICD Remote transmission received 

## 2017-01-02 NOTE — Progress Notes (Signed)
EPIC Encounter for ICM Monitoring  Patient Name: Sydney Phillips is a 81 y.o. female Date: 01/02/2017 Primary Care Physican: Laurey Morale, MD Primary Cardiologist:Croitoru Electrophysiologist: Croitoru DryWeight: unknown Bi-V Pacing: 99.2%       Patient difficult to reach.  Remote transmission was due on 12/23/2016 but patient sent remote on 01/01/2017.  Patient had office visit today with Gearlean Alf, PA.     Thoracic impedance abnormal suggesting dryness since that correlates with being diuresed at hospitalization from 12/17/2016 to 12/20/2016 for HF.   Prescribed dosage: Dosage changed at office visit today, 01/02/2017.  Furosemide 80 mg 1 tablet every AM and 0.5 tablet (40 mg total) every PM.  Potassium 20 mEq 1 tablet twice a day.   Recommendations:  No call to patient today, being seen in office.    Follow-up plan: ICM clinic phone appointment on 01/19/2017 for recheck fluid levels.  Office appointment scheduled 02/06/2017 with Dr. Sallyanne Kuster.  Copy of ICM check sent to primary cardiologist and device physician.   3 month ICM trend: 01/01/2017   1 Year ICM trend:      [name], RN 01/02/2017 3:17 PM

## 2017-01-03 ENCOUNTER — Encounter: Payer: Self-pay | Admitting: Physician Assistant

## 2017-01-05 ENCOUNTER — Telehealth: Payer: Self-pay | Admitting: Cardiovascular Disease

## 2017-01-05 NOTE — Telephone Encounter (Signed)
New Message ° °Pt call requesting to speak with RN about lab results. Please call back to discuss  °

## 2017-01-05 NOTE — Telephone Encounter (Signed)
Returned the call to the patient. She has been informed that the lab results are not it yet and that she would get a phone call when they were done. She verbalized her understanding.

## 2017-01-07 ENCOUNTER — Telehealth: Payer: Self-pay | Admitting: *Deleted

## 2017-01-07 DIAGNOSIS — Z79899 Other long term (current) drug therapy: Secondary | ICD-10-CM

## 2017-01-07 DIAGNOSIS — I272 Pulmonary hypertension, unspecified: Secondary | ICD-10-CM

## 2017-01-07 NOTE — Telephone Encounter (Signed)
Sydney Phillips has spoken to patient and given recommendations and clarification for rationale for recheck of BMET this week. Pt to come in for bloodwork, will call her once those results have been interpreted.

## 2017-01-07 NOTE — Telephone Encounter (Signed)
-----   Message from [place], [place] sent at 01/07/2017 12:53 PM EDT ----- Kidney function consistent dehydration consistent with physical exam during office visit. Will need a recheck BMET between now and next week to reassess kidney function. Expect kidney function to have improved.

## 2017-01-08 DIAGNOSIS — N289 Disorder of kidney and ureter, unspecified: Secondary | ICD-10-CM | POA: Diagnosis not present

## 2017-01-08 DIAGNOSIS — Z79899 Other long term (current) drug therapy: Secondary | ICD-10-CM | POA: Diagnosis not present

## 2017-01-09 DIAGNOSIS — J449 Chronic obstructive pulmonary disease, unspecified: Secondary | ICD-10-CM | POA: Diagnosis not present

## 2017-01-09 LAB — BASIC METABOLIC PANEL
BUN / CREAT RATIO: 16 (ref 12–28)
BUN: 20 mg/dL (ref 8–27)
CHLORIDE: 101 mmol/L (ref 96–106)
CO2: 28 mmol/L (ref 20–29)
Calcium: 9.7 mg/dL (ref 8.7–10.3)
Creatinine, Ser: 1.28 mg/dL — ABNORMAL HIGH (ref 0.57–1.00)
GFR calc non Af Amer: 40 mL/min/{1.73_m2} — ABNORMAL LOW (ref >59)
GFR, EST AFRICAN AMERICAN: 46 mL/min/{1.73_m2} — AB (ref >59)
Glucose: 186 mg/dL — ABNORMAL HIGH (ref 65–99)
Potassium: 4.9 mmol/L (ref 3.5–5.2)
SODIUM: 143 mmol/L (ref 134–144)

## 2017-01-09 NOTE — Progress Notes (Signed)
Kidney function significantly improved as expected. Electrolyte stable.

## 2017-01-14 NOTE — Progress Notes (Signed)
Ok. Depend on her symptom, if SOB increase, then we may have to up the dose later, but previous dose was dehydrating her based on lab work. During the mean time, I would recommend monitor daily weight, call cardiology if the weight increase by more than 3 lbs overnight or 5 lbs in a single week or if has increasing SOB.

## 2017-01-19 ENCOUNTER — Telehealth: Payer: Self-pay

## 2017-01-19 ENCOUNTER — Ambulatory Visit (INDEPENDENT_AMBULATORY_CARE_PROVIDER_SITE_OTHER): Payer: Self-pay

## 2017-01-19 DIAGNOSIS — Z95810 Presence of automatic (implantable) cardiac defibrillator: Secondary | ICD-10-CM

## 2017-01-19 DIAGNOSIS — I5042 Chronic combined systolic (congestive) and diastolic (congestive) heart failure: Secondary | ICD-10-CM

## 2017-01-19 NOTE — Telephone Encounter (Signed)
Remote ICM transmission received.  Attempted patient call and no answer or answering machine.  

## 2017-01-19 NOTE — Progress Notes (Signed)
EPIC Encounter for ICM Monitoring  Patient Name: Sydney Phillips is a 81 y.o. female Date: 01/19/2017 Primary Care Physican: Laurey Morale, MD Primary Cardiologist:Croitoru Electrophysiologist: Croitoru DryWeight: unknown Bi-V Pacing: 98.8%       Attempted call to patient and no answer or answering machine.  Transmission reviewed.    Thoracic impedance abnormal suggesting fluid accumulation starting from 01/06/2017 and also 11/18/2016 to 12/19/2016.  Impedance was above baseline suggesting dryness 12/20/2016 to 01/05/2017.  Prescribed dosage: Furosemide 80 mg 1 tablet every AM and 0.5 tablet (40 mg total) every PM.  Potassium 20 mEq 1 tablet twice a day.  Labs: 01/08/2017 Creatinine 1.28, BUN 20, Potassium 4.9, Sodium 143, EGFR 40-46 01/02/2017 Creatinine 1.61, BUN 48, Potassium 5.5, Sodium 138, EGFR 30-35  12/20/2016 Creatinine 1.42, BUN 33, Potassium 5.0, Sodium 139, EGFR 34-40  12/19/2016 Creatinine 1.06, BUN 24, Potassium 4.7, Sodium 139, EGFR 49-57  12/18/2016 Creatinine 1.32, BUN 20, Potassium 3.4, Sodium 138, EGFR 38-44  12/17/2016 Creatinine 1.09, BUN 9, Potassium 2.4, Sodium 141, EGFR 47-55  09/25/2016 Creatinine 1.09, BUN 34, Potassium 3.9, Sodium 143  09/04/2016 Creatinine 0.98, BUN 13, Potassium 3.3, Sodium 141 08/08/2016 Creatinine 1.05, BUN 11, Potassium 4.1Sodium 144, BNP 540.2 07/09/2016 Creatinine 1.19, BUN 19, Potassium 4.4, Sodium 140 06/30/2016 Creatinine 1.15, BUN 27, Potassium 4.6, Sodium 139, EGFR 44-51 01/14/2018Creatinine 1.12, BUN 26, Potassium 4.3, Sodium 140, EGFR 46-53  01/13/2018Creatinine 1.01, BUN 26, Potassium 3.7, Sodium 141, EGFR 52->60  06/27/2016 Creatinine 1.06, BUN 14, Potassium 3.7, Sodium 140, EGFR 49-57  01/11/2018Creatinine 0.86, BUN 6, Potassium 3.4, Sodium 142, EGFR >60  01/05/2018Creatinine 0.83, BUN 7, Potassium 3.8, Sodium 144    Recommendations: NONE - Unable to reach patient   Follow-up plan: ICM clinic phone  appointment on 01/23/2017 to recheck fluid levels.  Office appointment scheduled 02/06/2017 with Dr. Sallyanne Kuster.    3 month ICM trend: 01/19/2017   1 Year ICM trend:      [name], RN 01/19/2017 10:29 AM

## 2017-01-20 NOTE — Progress Notes (Signed)
Attempted call to patient and no answer. 

## 2017-01-23 ENCOUNTER — Telehealth: Payer: Self-pay

## 2017-01-23 ENCOUNTER — Ambulatory Visit (INDEPENDENT_AMBULATORY_CARE_PROVIDER_SITE_OTHER): Payer: Self-pay

## 2017-01-23 DIAGNOSIS — I5042 Chronic combined systolic (congestive) and diastolic (congestive) heart failure: Secondary | ICD-10-CM

## 2017-01-23 DIAGNOSIS — Z95810 Presence of automatic (implantable) cardiac defibrillator: Secondary | ICD-10-CM

## 2017-01-23 NOTE — Progress Notes (Signed)
EPIC Encounter for ICM Monitoring  Patient Name: SABRIAH HOBBINS is a 81 y.o. female Date: 01/23/2017 Primary Care Physican: Laurey Morale, MD Primary Cardiologist:Croitoru Electrophysiologist: Croitoru DryWeight: unknown Bi-V Pacing: 98.3%                                               Attempted call to patient and no answer or answering machine.  Transmission reviewed.    Thoracic impedance abnormal suggesting fluid accumulation starting from 01/06/2017   Prescribed dosage: Furosemide 80 mg 1 tablet every AM and 0.5 tablet (40 mg total) every PM. Potassium 20 mEq 1 tablet twice a day.  Labs: 01/08/2017 Creatinine 1.28, BUN 20, Potassium 4.9, Sodium 143, EGFR 40-46 01/02/2017 Creatinine 1.61, BUN 48, Potassium 5.5, Sodium 138, EGFR 30-35  12/20/2016 Creatinine 1.42, BUN 33, Potassium 5.0, Sodium 139, EGFR 34-40  12/19/2016 Creatinine 1.06, BUN 24, Potassium 4.7, Sodium 139, EGFR 49-57  12/18/2016 Creatinine 1.32, BUN 20, Potassium 3.4, Sodium 138, EGFR 38-44  12/17/2016 Creatinine 1.09, BUN 9, Potassium 2.4, Sodium 141, EGFR 47-55  09/25/2016 Creatinine 1.09, BUN 34, Potassium 3.9, Sodium 143  09/04/2016 Creatinine 0.98, BUN 13,Potassium 3.3, Sodium 141 08/08/2016 Creatinine 1.05, BUN 11, Potassium 4.1Sodium 144, BNP 540.2 07/09/2016 Creatinine 1.19, BUN 19, Potassium 4.4, Sodium 140 06/30/2016 Creatinine 1.15, BUN 27, Potassium 4.6, Sodium 139, EGFR 44-51 01/14/2018Creatinine 1.12, BUN 26, Potassium 4.3, Sodium 140, EGFR 46-53  01/13/2018Creatinine 1.01, BUN 26, Potassium 3.7, Sodium 141, EGFR 52->60  06/27/2016 Creatinine 1.06, BUN 14, Potassium 3.7, Sodium 140, EGFR 49-57  01/11/2018Creatinine 0.86, BUN 6, Potassium 3.4, Sodium 142, EGFR >60  01/05/2018Creatinine 0.83, BUN 7, Potassium 3.8, Sodium 144   Recommendations:  NONE - Unable to reach patient   Follow-up plan: ICM clinic phone appointment on 02/23/2017 since patient has office appointment  scheduled 02/06/2017 with Dr. Sallyanne Kuster.  Copy of ICM check sent to primary cardiologist and device physician.   3 month ICM trend: 01/23/2017   1 Year ICM trend:      [name], RN 01/23/2017 12:22 PM

## 2017-01-23 NOTE — Telephone Encounter (Signed)
Remote ICM transmission received.  Attempted patient call and no answer or answering machine.  

## 2017-01-23 NOTE — Progress Notes (Signed)
Always hard to track her down MCr

## 2017-01-27 ENCOUNTER — Telehealth: Payer: Self-pay | Admitting: Family Medicine

## 2017-01-27 DIAGNOSIS — J439 Emphysema, unspecified: Secondary | ICD-10-CM

## 2017-01-27 NOTE — Telephone Encounter (Signed)
Patient mailed in medical release form and physician's verification form attached is an envelope to mail the form to [employer] Disposition: Dr's folder

## 2017-01-28 NOTE — Telephone Encounter (Signed)
The forms were filled out  

## 2017-01-31 ENCOUNTER — Other Ambulatory Visit: Payer: Self-pay | Admitting: Family Medicine

## 2017-02-02 ENCOUNTER — Telehealth: Payer: Self-pay

## 2017-02-02 NOTE — Telephone Encounter (Signed)
Called pt and informed her, her device had reached ERI and stressed the importance of her keeping her appointment with Dr. Loletha Grayer on Friday, pt stated she would.

## 2017-02-03 ENCOUNTER — Other Ambulatory Visit: Payer: Self-pay | Admitting: Family Medicine

## 2017-02-03 NOTE — Telephone Encounter (Signed)
Call in #120 with 5 rf 

## 2017-02-06 ENCOUNTER — Encounter: Payer: Self-pay | Admitting: Cardiovascular Disease

## 2017-02-06 ENCOUNTER — Ambulatory Visit (INDEPENDENT_AMBULATORY_CARE_PROVIDER_SITE_OTHER): Payer: Medicare Other | Admitting: Cardiovascular Disease

## 2017-02-06 VITALS — BP 142/72 | HR 75 | Ht 65.5 in | Wt 175.8 lb

## 2017-02-06 DIAGNOSIS — D689 Coagulation defect, unspecified: Secondary | ICD-10-CM

## 2017-02-06 DIAGNOSIS — I5042 Chronic combined systolic (congestive) and diastolic (congestive) heart failure: Secondary | ICD-10-CM

## 2017-02-06 DIAGNOSIS — I2510 Atherosclerotic heart disease of native coronary artery without angina pectoris: Secondary | ICD-10-CM

## 2017-02-06 DIAGNOSIS — E7800 Pure hypercholesterolemia, unspecified: Secondary | ICD-10-CM

## 2017-02-06 DIAGNOSIS — Z79899 Other long term (current) drug therapy: Secondary | ICD-10-CM

## 2017-02-06 DIAGNOSIS — Z4502 Encounter for adjustment and management of automatic implantable cardiac defibrillator: Secondary | ICD-10-CM | POA: Diagnosis not present

## 2017-02-06 DIAGNOSIS — J438 Other emphysema: Secondary | ICD-10-CM

## 2017-02-06 DIAGNOSIS — I739 Peripheral vascular disease, unspecified: Secondary | ICD-10-CM | POA: Diagnosis not present

## 2017-02-06 NOTE — Patient Instructions (Signed)
Dr Sallyanne Kuster recommends that you have your ICD Generator changed out. It has been scheduled for Wednesday, September 19th, 2018 at 3:30p. You will need to be at the hospital by 1:30p so they can get you ready for the procedure. This will be done at [facility]. Instructions have been provided.

## 2017-02-06 NOTE — Progress Notes (Signed)
Patient ID: Sydney Phillips, female   DOB: May 08, 1936, 81 y.o.   MRN: [id number]    Cardiology Office Note    Date:  02/08/2017   ID:  Sydney Phillips 1936/05/17, MRN [id number]  PCP:  Laurey Morale, MD  Cardiologist:   [name], MD   No chief complaint on file.   History of Present Illness:  Sydney Phillips is a 81 y.o. female with cardiomyopathy here for CHF follow up, check on CRT-D device, Recent hospitalizations in November 2017, December 2017 and January 2018.   Since then she has had a fairly steady course clinically, although her Sydney Phillips shows broad swings and thoracic impedance. Most recently there was a marked increase in thoracic impedance in early July suggestive of volume reduction, now back to baseline. Because of his throat swelling, or thoracic impedance suggests volume overload currently, but I think we are finally reaching some level of stability. Her activity level is increasing and is now up to 1.6 hours per day, much better than it was in the spring. She reports feeling well and denies problems with orthopnea, PND or leg edema. She has not had dizziness or syncope. She denies falls.  She has 99% biventricular pacing and requires atrial only 4% of the time. Her device has not recorded any ventricular tachycardia or atrial fibrillation.   Her device just reached RRT a few days ago on August 20. Lead parameters remain excellent, other than the relatively high but stable left ventricular pacing threshold of 1.875 V at 1 ms.  She has mixed ischemic and nonischemic CMP and has been CRT "hyper-responder". In 2009 her ejection fraction was 20%. After CRT, her LV improved and in 2013 EF was 50% and she had several years of relative cardiac stability. She received drug-eluting stents to the right coronary artery and left circumflex coronary artery on 05/20/2016, still receiving dual antiplatelet therapy with aspirin and clopidogrel.  She has twin boys; Sydney Phillips has  medical power of attorney  LVEF in September 2017 was 40-45%, then dropped to 25-30% in November 2017 when she had NSTEMI, in the setting of COPD exacerbation, respiratory failure, acute renal failure and syncope. Cardiac catheterization showed severe stenoses in the right coronary artery and left circumflex coronary artery both treated with drug-eluting stents. There was also moderate ostial stenosis of the LAD. Enrolled in Twilight study. Repeat echo in January 2018 showed EF 20-25% with a severely dilated left ventricle, moderate mitral regurgitation, systolic PA pressure 53 mmHg. Was readmitted on December 15 with altered mental status, suspicion of steroid induced psychosis and discharged to skilled nursing facility, but again seen in the emergency room on December 19 after a fall with head injury. Gradually improved mental status and felt to be back at baseline by early January. After that hospitalization for diuretics were held for acute renal insufficiency and she was admitted on January 11 with acute exacerbation of heart failure. Improved with diuretics and discharged on January 15 with a weight of 77.4 kg (171 lb).     Past Medical History:  Diagnosis Date  . Arthritis   . Automatic implantable cardioverter-defibrillator in situ   . Bronchitis   . CAD (coronary artery disease)    a. s/p DES to LCx/RCA 05/2016, ostial LAD disease.  . Cardiomyopathy ([place])   . Chronic back pain   . Chronic combined systolic and diastolic CHF (congestive heart failure) ([place])   . Chronic constipation   . Chronic pain   . CKD (  chronic kidney disease), stage III   . Colon polyps 2003.  2015.   HP polyps 2003.  adnomas 2015.  required referal to [facility] for colonoscopic resection of flat polyps.   Marland Kitchen COPD (chronic obstructive pulmonary disease) ([facility])   . Dementia   . Depression with anxiety    takes Cymbalta daily  . Diabetes mellitus ([place])   . Early cataracts, bilateral   . Fibromyalgia   . GERD  (gastroesophageal reflux disease)    was on meds but was taken off;now watches what she eats  . Hemorrhoids   . History of kidney stones   . Hx of colonic polyps   . Hyperlipemia    takes Crestor daily  . Hypertension    takes Amlodipine and Metoprolol daily  . Insomnia   . LBBB (left bundle branch block)    Stress test 09/03/2010, EF 55  . Myocardial infarction ([place])   . PAD (peripheral artery disease) (H[facility])    Carotid, subclavian, and lower extremity beds, currently not symptomatic  . Presence of combination internal cardiac defibrillator (ICD) and pacemaker   . Presence of permanent cardiac pacemaker   . Pulmonary hypertension ([place])   . S/P angioplasty with stent, lt. subclavian 07/31/11 08/01/2011  . Subclavian arterial stenosis, lt, with PTA/STENT 07/31/11 08/01/2011  . Syncope 07/28/2011   EF - 50-55, moderate concentric hypertrophy in left ventricle  . Urinary incontinence   . Vertigo    takes Meclizine prn    Past Surgical History:  Procedure Laterality Date  . ABDOMINAL AORTAGRAM N/A 08/12/2013   Procedure: ABDOMINAL Maxcine Ham;  Surgeon: Elam Dutch, MD;  Location: [facility] CATH LAB;  Service: Cardiovascular;  Laterality: N/A;  . ABDOMINAL HYSTERECTOMY    . APPENDECTOMY    . BACK SURGERY  2012  . BIV ICD GENERTAOR CHANGE OUT Left 02/20/2012   Procedure: BIV ICD GENERTAOR CHANGE OUT;  Surgeon: [name], MD;  Location: [facility] CATH LAB;  Service: Cardiovascular;  Laterality: Left;  . CARDIAC CATHETERIZATION  12/01/2007   By Dr. Melvern Banker, left heart cath,   . CARDIAC CATHETERIZATION N/A 05/20/2016   Procedure: Right/Left Heart Cath and Coronary Angiography;  Surgeon: Sherren Mocha, MD;  Location: [place] CV LAB;  Service: Cardiovascular;  Laterality: N/A;  . CARDIAC CATHETERIZATION N/A 05/20/2016   Procedure: Coronary Stent Intervention;  Surgeon: Sherren Mocha, MD;  Location: [place] CV LAB;  Service: Cardiovascular;  Laterality: N/A;  . CARDIAC DEFIBRILLATOR PLACEMENT   05/2008   By Dr Blanch Media, [employer] CANNOT HAVE MRI's  . CAROTID ANGIOGRAM N/A 07/31/2011   Procedure: CAROTID ANGIOGRAM;  Surgeon: Lorretta Harp, MD;  Location: [facility] CATH LAB;  Service: Cardiovascular;  Laterality: N/A;  carotid angiogram and possible Lt SCA PTA  . COLONOSCOPY W/ POLYPECTOMY  12/2013  . CORONARY ANGIOPLASTY    . ENDARTERECTOMY Left 11/08/2014   Procedure: LEFT CAROTID ENDARTERECTOMY WITH HEMASHIELD PATCH ANGIOPLASTY;  Surgeon: Elam Dutch, MD;  Location: [place];  Service: Vascular;  Laterality: Left;  . ESOPHAGOGASTRODUODENOSCOPY N/A 01/20/2014   Procedure: ESOPHAGOGASTRODUODENOSCOPY (EGD);  Surgeon: Jerene Bears, MD;  Location[facility] ENDOSCOPY;  Service: Endoscopy;  Laterality: N/A;  . FEMORAL-POPLITEAL BYPASS GRAFT Right 10/12/2013   Procedure:   Femoral-Peroneal trunk  bypass with nonreversed greater saphenous vein graft;  Surgeon: Elam Dutch, MD;  Location: [place];  Service: Vascular;  Laterality: Right;  . GIVENS CAPSULE STUDY N/A 01/20/2014   Procedure: GIVENS CAPSULE STUDY;  Surgeon: Jerene Bears, MD;  Location: [place] ENDOSCOPY;  Service: Gastroenterology;  Laterality: N/A;  . INTRAOPERATIVE ARTERIOGRAM Right 10/12/2013   Procedure: INTRA OPERATIVE ARTERIOGRAM;  Surgeon: Elam Dutch, MD;  Location: [place];  Service: Vascular;  Laterality: Right;  . ORIF ELBOW FRACTURE  08/16/2011   Procedure: OPEN REDUCTION INTERNAL FIXATION (ORIF) ELBOW/OLECRANON FRACTURE;  Surgeon: Schuyler Amor, MD;  Location: [place];  Service: Orthopedics;  Laterality: Left;  . RENAL ANGIOGRAM N/A 08/12/2013   Procedure: RENAL ANGIOGRAM;  Surgeon: Elam Dutch, MD;  Location: [facility] CATH LAB;  Service: Cardiovascular;  Laterality: N/A;  . SUBCLAVIAN STENT PLACEMENT Left 07/31/2011   7x18 Genesis, balloon, with reduction of 90% ostial left subclavian artery stenosis to 0% with residual excellent flow  . TONSILLECTOMY    . TUBAL LIGATION      Current Medications: Outpatient Medications Prior to  Visit  Medication Sig Dispense Refill  . albuterol (PROVENTIL HFA;VENTOLIN HFA) 108 (90 Base) MCG/ACT inhaler Inhale 2 puffs into the lungs every 4 (four) hours as needed for wheezing or shortness of breath. 1 Inhaler 5  . allopurinol (ZYLOPRIM) 300 MG tablet TAKE ONE TABLET BY MOUTH ONCE DAILY 30 tablet 11  . ALPRAZolam (XANAX) 1 MG tablet TAKE 1 TABLET BY MOUTH EVERY 6 HOURS AS NEEDED FOR  ANXIETY 120 tablet 5  . aspirin 81 MG chewable tablet Chew 1 tablet (81 mg total) by mouth daily. 30 tablet 1  . atorvastatin (LIPITOR) 40 MG tablet Take 1 tablet (40 mg total) by mouth daily at 6 PM. 90 tablet 3  . bisoprolol (ZEBETA) 5 MG tablet Take 1 tablet (5 mg total) by mouth daily. 90 tablet 3  . clopidogrel (PLAVIX) 75 MG tablet Take 1 tablet (75 mg total) by mouth daily. 60 tablet 11  . DULoxetine (CYMBALTA) 60 MG capsule Take 60 mg by mouth 2 (two) times daily.    . fluticasone (FLONASE) 50 MCG/ACT nasal spray Place 2 sprays into both nostrils daily as needed for allergies. 16 g 11  . furosemide (LASIX) 80 MG tablet Take 80mg  in AM, 40mg  in PM. 60 tablet 4  . Magnesium Oxide 400 MG CAPS Take 1 capsule (400 mg total) by mouth 2 (two) times daily. 180 capsule 3  . nitroGLYCERIN (NITROSTAT) 0.4 MG SL tablet Place 0.4 mg under the tongue every 5 (five) minutes as needed for chest pain. X 3 doses    . potassium chloride SA (K-DUR,KLOR-CON) 20 MEQ tablet Take 1 tablet (20 mEq total) by mouth 2 (two) times daily. 180 tablet 3  . traZODone (DESYREL) 50 MG tablet Take 1 tablet (50 mg total) by mouth at bedtime. 30 tablet 5  . HYDROcodone-acetaminophen (NORCO) 10-325 MG tablet Take one tablet by mouth every 6 hours as needed for pain. DNE 3gm of APAP from all sources/24hours (Patient not taking: Reported on 02/06/2017) 120 tablet 0   No facility-administered medications prior to visit.      Allergies:   Potassium-containing compounds; Azithromycin; Codeine; Darvon; Erythromycin; Meloxicam; Norco  [hydrocodone-acetaminophen]; Penicillins; Propoxyphene n-acetaminophen; Rofecoxib; Rosuvastatin; Statins; and Sulfa antibiotics   Social History   Social History  . Marital status: Widowed    Spouse name: N/A  . Number of children: 3  . Years of education: 12   Occupational History  . Retired    Social History Main Topics  . Smoking status: Current Every Day Smoker    Packs/day: 0.50    Years: 62.00    Types: Cigarettes  . Smokeless tobacco: Never Used     Comment: Down  to 3-4 cigarettes per day  . Alcohol use No  . Drug use: No  . Sexual activity: No   Other Topics Concern  . None   Social History Narrative   Ok to share information with medical POA, Son Gabriel Carina   Right-handed   Caffeine: Pepsi     Family History:  The patient's family history includes CAD in her father; Cancer in her sister; Colon cancer in her maternal grandmother; Deep vein thrombosis in her son; Diabetes in her sister; Heart disease in her father, mother, and sister; Hyperlipidemia in her father and unknown relative.   ROS:   Please see the history of present illness.    ROS All other systems reviewed and are negative.   PHYSICAL EXAM:   VS:  BP (!) 142/72 (BP Location: Right Arm, Patient Position: Sitting, Cuff Size: Normal)   Pulse 75   Ht 5' 5.5" (1.664 m)   Wt 175 lb 12.8 oz (79.7 kg)   BMI 28.81 kg/m     General: Alert, oriented x3, no distress, Mildly overweight, Head: no evidence of trauma, very poor dentition, PERRL, EOMI, no exophtalmos or lid lag, no myxedema, no xanthelasma; normal ears, nose and oropharynx Neck: normal jugular venous pulsations and no hepatojugular reflux; brisk carotid pulses without delay and no carotid bruits Chest: clear to auscultation, no signs of consolidation by percussion or palpation, normal fremitus, symmetrical and full respiratory excursions Cardiovascular: normal position and quality of the apical impulse, regular rhythm, normal first and  paradoxically split second heart sounds, no murmurs, rubs or gallops. Healthy left subclavian defibrillator site Abdomen: no tenderness or distention, no masses by palpation, no abnormal pulsatility or arterial bruits, normal bowel sounds, no hepatosplenomegaly Extremities: no clubbing, cyanosis or edema; 2+ radial, ulnar and brachial pulses bilaterally; 2+ right femoral, posterior tibial and dorsalis pedis pulses; 2+ left femoral, posterior tibial and dorsalis pedis pulses; no subclavian or femoral bruits Neurological: grossly nonfocal Psych: euthymic mood, full affect  Wt Readings from Last 3 Encounters:  02/06/17 175 lb 12.8 oz (79.7 kg)  01/02/17 169 lb 3.2 oz (76.7 kg)  12/24/16 171 lb (77.6 kg)      Studies/Labs Reviewed:   EKG:  EKG is ordered today.  The ekg ordered today demonstrates Atrial sensed, biventricular paced rhythm, QTC 417 ms  Recent Labs: 05/14/2016: TSH 1.013 08/07/2016: Magnesium 1.9 09/25/2016: ALT 16 12/17/2016: B Natriuretic Peptide 1,060.6 12/18/2016: Hemoglobin 11.8; Platelets 241 01/08/2017: BUN 20; Creatinine, Ser 1.28; Potassium 4.9; Sodium 143   Lipid Panel    Component Value Date/Time   CHOL 212 (H) 12/19/2016 0736   TRIG 124 12/19/2016 0736   HDL 47 12/19/2016 0736   CHOLHDL 4.5 12/19/2016 0736   VLDL 25 12/19/2016 0736   LDLCALC 140 (H) 12/19/2016 0736   LDLDIRECT 152.0 05/30/2015 1452     ASSESSMENT:    1. ICD (implantable cardioverter-defibrillator) battery depletion   2. Chronic combined systolic (congestive) and diastolic (congestive) heart failure (HCC)   3. Atherosclerosis of native coronary artery of native heart without angina pectoris   4. PAD (peripheral artery disease) ([place])   5. Hypercholesterolemia   6. Other emphysema ([place])   7. Blood clotting disorder ([place])   8. Medication management      PLAN:  In order of problems listed above:  1. ICD at RRT: Discussed the generator change procedure, potential complications. She is  not device dependent. This procedure has been fully reviewed with the patient and informed consent has been obtained.  Will continue dual antiplatelet therapy throughout. 2  CHF: She has a relatively narrow margin of compensation. Seems to have reached a steady state based on both clinical data and her Optivol recordings. Her weight is a little bit higher than what we had estimated to her optimal "dry weight" appears to be (168-170 pounds), but she is asymptomatic and appears clinically to be euvolemic. Most recent EF around 25% on echo from July 2018, has not improved despite revascularization in December. She is on ACE inhibitor recent a highly selective beta blocker. Would like to avoid increasing these medications since I'm concerned about her tendency to hypotension and falls. 3. CAD s/p DES to RCA and LCX Dec 2017: on ASA and clopidogrel. Cautioned that she should not interrupt the antiplatelets at least for 6 months following her procedure (that is until 11/18/2016). There is good reason to consider lifelong aggressive antiplatelet therapy based on her anatomy, but note that she had a lot of problems with GI bleeding a couple of years ago. So far no bleeding complications in the roughly 8 months that have passed since her stents. We will not stop the dual antiplatelet therapy for her generator change. 4. PAD: History of left subclavian stent, should monitor blood pressure in the right arm. History of left carotid endarterectomy, followed by Dr. Oneida Alar. Asymptomatic decrease in left lower extremity ABI. 5. HLP: Has unsuccessfully tried to take statins in the past. High risk of progression of disease unless intervene and bring her LDL down to target under 70. We'll try to see if we can get funding for PCS K9 inhibitors, because she cannot afford the co-pay. . 6. COPD: Strongly encouraged to quit smoking again, but she is not ready to try    Medication Adjustments/Labs and Tests Ordered: Current  medicines are reviewed at length with the patient today.  Concerns regarding medicines are outlined above.  Medication changes, Labs and Tests ordered today are listed in the Patient Instructions below. Patient Instructions  Dr Sallyanne Kuster recommends that you have your ICD Generator changed out. It has been scheduled for Wednesday, September 19th, 2018 at 3:30p. You will need to be at the hospital by 1:30p so they can get you ready for the procedure. This will be done at [facility]. Instructions have been provided.     Signed, [name], MD  02/08/2017 2:40 PM    [facility] [address], [place], [place]  [address] Phone: [phone]; Fax: [phone]

## 2017-02-09 ENCOUNTER — Telehealth: Payer: Self-pay | Admitting: Family Medicine

## 2017-02-09 DIAGNOSIS — J449 Chronic obstructive pulmonary disease, unspecified: Secondary | ICD-10-CM | POA: Diagnosis not present

## 2017-02-09 MED ORDER — HYDROCODONE-ACETAMINOPHEN 10-325 MG PO TABS: 1.0000 | ORAL_TABLET | Freq: Four times a day (QID) | ORAL | 0 refills | Status: DC | PRN

## 2017-02-09 NOTE — Telephone Encounter (Signed)
° °  Pt request refill of the following:  Hydrocodone    Phamacy:

## 2017-02-09 NOTE — Telephone Encounter (Signed)
Script is ready for pick up here at front office and I left a message.  

## 2017-02-09 NOTE — Telephone Encounter (Signed)
done

## 2017-02-10 ENCOUNTER — Ambulatory Visit (INDEPENDENT_AMBULATORY_CARE_PROVIDER_SITE_OTHER): Payer: Medicare Other | Admitting: Family Medicine

## 2017-02-10 ENCOUNTER — Encounter: Payer: Self-pay | Admitting: Family Medicine

## 2017-02-10 VITALS — BP 130/62 | HR 81 | Temp 98.1°F | Ht 65.5 in | Wt 180.0 lb

## 2017-02-10 DIAGNOSIS — S161XXA Strain of muscle, fascia and tendon at neck level, initial encounter: Secondary | ICD-10-CM

## 2017-02-10 DIAGNOSIS — S20229A Contusion of unspecified back wall of thorax, initial encounter: Secondary | ICD-10-CM

## 2017-02-10 DIAGNOSIS — Z23 Encounter for immunization: Secondary | ICD-10-CM

## 2017-02-10 NOTE — Addendum Note (Signed)
Addended by: Aggie Hacker A on: 02/10/2017 05:18 PM   Modules accepted: Orders

## 2017-02-10 NOTE — Progress Notes (Signed)
   Subjective:    Patient ID: Sydney Phillips, female    DOB: 10/04/1935, 81 y.o.   MRN: [id number]  HPI Here to check injuries from a fall at home 3 days ago. She was cleaning her bathroom while not using her walker and she lost her balance, falling backward against the vanity. She struck her back against two door handles and struck the back of her head against the edge of the marble. No LOC. She has some neck stiffness but no real pain. No headache. She has soreness in the back. Deep breathing is not painful and she is not SOB.    Review of Systems  Constitutional: Negative.   Respiratory: Negative.   Cardiovascular: Negative.   Gastrointestinal: Negative.   Musculoskeletal: Positive for back pain and neck stiffness. Negative for neck pain.  Neurological: Negative for dizziness and headaches.       Objective:   Physical Exam  Constitutional: She is oriented to person, place, and time. She appears well-developed and well-nourished. No distress.  Using her walker   HENT:  Head: Normocephalic and atraumatic.  Neck: Normal range of motion. Neck supple. No thyromegaly present.  Cardiovascular: Normal rate, regular rhythm, normal heart sounds and intact distal pulses.   Pulmonary/Chest: Effort normal and breath sounds normal. No respiratory distress. She has no wheezes. She has no rales.  Abdominal: Soft. Bowel sounds are normal. She exhibits no distension. There is no rebound and no guarding.  Musculoskeletal:  She has two large ecchymoses on the back, both tender. No crepitus.   Lymphadenopathy:    She has no cervical adenopathy.  Neurological: She is alert and oriented to person, place, and time.          Assessment & Plan:  She had a recent fall resulting in bruises to the back which should heal up quickly. She had a mild neck strain that should resolve quickly. I urged her to use her walker at all times.  Alysia Penna, MD

## 2017-02-10 NOTE — Patient Instructions (Signed)
WE NOW OFFER   [facility] FAST TRACK!!!  SAME DAY Appointments for ACUTE CARE  Such as: Sprains, Injuries, cuts, abrasions, rashes, muscle pain, joint pain, back pain Colds, flu, sore throats, headache, allergies, cough, fever  Ear pain, sinus and eye infections Abdominal pain, nausea, vomiting, diarrhea, upset stomach Animal/insect bites  3 Easy Ways to Schedule: Walk-In Scheduling Call in scheduling Mychart Sign-up: [email or web address]         

## 2017-02-11 ENCOUNTER — Telehealth: Payer: Self-pay | Admitting: Family Medicine

## 2017-02-11 NOTE — Telephone Encounter (Signed)
Pt called in this afternoon, she received a high dose flu dose yesterday 02/10/2017, she has not been feeling well today, a little sick on stomach, no fever. Per Dr. Sarajane Jews it could be a side effect of the flu vaccine and I spoke with pt and went over this, also advised pt to let us know how she feels tomorrow.

## 2017-02-12 NOTE — Telephone Encounter (Signed)
I left a voice message for pt,, just calling to check on her. She was possibly having some side effects of flu vaccine given on 02/10/2017.

## 2017-02-17 ENCOUNTER — Other Ambulatory Visit: Payer: Self-pay | Admitting: Family Medicine

## 2017-02-17 ENCOUNTER — Encounter (HOSPITAL_COMMUNITY): Payer: Self-pay | Admitting: Emergency Medicine

## 2017-02-17 ENCOUNTER — Other Ambulatory Visit: Payer: Self-pay | Admitting: Cardiovascular Disease

## 2017-02-17 ENCOUNTER — Emergency Department (HOSPITAL_COMMUNITY): Payer: Medicare Other

## 2017-02-17 ENCOUNTER — Emergency Department (HOSPITAL_COMMUNITY)
Admission: EM | Admit: 2017-02-17 | Discharge: 2017-02-17 | Disposition: A | Discharge status: Home / Self Care | Payer: Medicare Other | Attending: Emergency Medicine | Admitting: Emergency Medicine

## 2017-02-17 ENCOUNTER — Telehealth: Payer: Self-pay | Admitting: Family Medicine

## 2017-02-17 ENCOUNTER — Telehealth: Payer: Self-pay | Admitting: *Deleted

## 2017-02-17 DIAGNOSIS — R0602 Shortness of breath: Secondary | ICD-10-CM | POA: Insufficient documentation

## 2017-02-17 DIAGNOSIS — Z5321 Procedure and treatment not carried out due to patient leaving prior to being seen by health care provider: Secondary | ICD-10-CM | POA: Insufficient documentation

## 2017-02-17 DIAGNOSIS — Z4502 Encounter for adjustment and management of automatic implantable cardiac defibrillator: Secondary | ICD-10-CM

## 2017-02-17 LAB — BASIC METABOLIC PANEL
ANION GAP: 11 (ref 5–15)
BUN: 9 mg/dL (ref 6–20)
CHLORIDE: 99 mmol/L — AB (ref 101–111)
CO2: 30 mmol/L (ref 22–32)
Calcium: 9.1 mg/dL (ref 8.9–10.3)
Creatinine, Ser: 1.15 mg/dL — ABNORMAL HIGH (ref 0.44–1.00)
GFR calc non Af Amer: 44 mL/min — ABNORMAL LOW (ref >60)
GFR, EST AFRICAN AMERICAN: 51 mL/min — AB (ref >60)
Glucose, Bld: 192 mg/dL — ABNORMAL HIGH (ref 65–99)
Potassium: 2.8 mmol/L — ABNORMAL LOW (ref 3.5–5.1)
SODIUM: 140 mmol/L (ref 135–145)

## 2017-02-17 LAB — CBC
HCT: 40.5 % (ref 36.0–46.0)
HEMOGLOBIN: 13 g/dL (ref 12.0–15.0)
MCH: 30.4 pg (ref 26.0–34.0)
MCHC: 32.1 g/dL (ref 30.0–36.0)
MCV: 94.6 fL (ref 78.0–100.0)
Platelets: 267 10*3/uL (ref 150–400)
RBC: 4.28 MIL/uL (ref 3.87–5.11)
RDW: 15.9 % — ABNORMAL HIGH (ref 11.5–15.5)
WBC: 9 10*3/uL (ref 4.0–10.5)

## 2017-02-17 MED ORDER — ALBUTEROL SULFATE HFA 108 (90 BASE) MCG/ACT IN AERS: 2.0000 | INHALATION_SPRAY | RESPIRATORY_TRACT | 5 refills | Status: DC | PRN

## 2017-02-17 MED ORDER — ALBUTEROL SULFATE (2.5 MG/3ML) 0.083% IN NEBU
INHALATION_SOLUTION | RESPIRATORY_TRACT | Status: AC
  Filled 2017-02-17: qty 6

## 2017-02-17 MED ORDER — ALBUTEROL SULFATE (2.5 MG/3ML) 0.083% IN NEBU
5.0000 mg | INHALATION_SOLUTION | Freq: Once | RESPIRATORY_TRACT | Status: AC
  Administered 2017-02-17: 5 mg via RESPIRATORY_TRACT

## 2017-02-17 NOTE — ED Triage Notes (Signed)
Pt to ED c/o SOB since yesterday - pt has COPD and is on 2L O2 Elias-Fela Solis at home, but reports increasing it to 4L today d/t dyspnea. Pt using accessory muscles in triage, resp equal, but slightly labored. Pt denies CP/dizziness, no cough. She states she has been laying in bed all day because it's too much work to get up and around. Pt denies fevers. Pt states she used to have breathing treatments at home, and feels that is all she needs. Pt 85% on RA upon arrival because patient didn't want to bring her own tank, SpO2 increased to 90s with 2L. Pt A&O x 4. Skin warm/dry.

## 2017-02-17 NOTE — Telephone Encounter (Signed)
Deann called from [employer] stating the pts insurance will not cover the Rx for Ventolin or Proventil and will cover [employer].  Please send this Rx as the pt needs today due to difficulty breathing.

## 2017-02-17 NOTE — Telephone Encounter (Signed)
Pt requesting refill on Albuterol and send to [employer] [facility]. Per Dr. Clarene Duke okay to refill and I did send script e-scribe. Also pt said she had ran out of this medication and Lasix. She was feeling a little short of breath, had oxygen on all day. Her son was going to pick up meds now. Pt fell over the weekend and we advised her to go to ER, she has history of CHF. Pt said if she does not feel better once taking meds then she will go to ER, Dr. Sarajane Jews is aware.

## 2017-02-17 NOTE — Telephone Encounter (Signed)
I changed the order to [facility] and sent this in to [employer]

## 2017-02-17 NOTE — ED Notes (Signed)
Pt to the desk stating she wants to leave, pt encouraged to stay. Pt stated she will call medical records in the morning for results.

## 2017-02-18 ENCOUNTER — Telehealth: Payer: Self-pay | Admitting: Family Medicine

## 2017-02-18 DIAGNOSIS — J439 Emphysema, unspecified: Secondary | ICD-10-CM

## 2017-02-18 LAB — I-STAT TROPONIN, ED: Troponin i, poc: 0.01 ng/mL (ref 0.00–0.08)

## 2017-02-18 MED ORDER — ALBUTEROL SULFATE (2.5 MG/3ML) 0.083% IN NEBU: 2.5000 mg | INHALATION_SOLUTION | RESPIRATORY_TRACT | 12 refills | Status: DC | PRN

## 2017-02-18 NOTE — Telephone Encounter (Signed)
[employer] heart failure management called to request a nebulizer for her COPD. Pt will need both nebulizer and the solution to go in it.  Pt seen at ED last SOB.   They think pt has had one in the past.  [place] [place] would like [place] to reach out to pt if OK to do.

## 2017-02-18 NOTE — Telephone Encounter (Signed)
I emailed in Albuterol solution for the nebulizer and I wrote a rx for the nebulizer to fax in. The EKG in the ER yesterday was fine. The CXR showed some CHF as we expected. The labs were normal except her potassium was low. She should increase the potassium pills to TID instead of BID.

## 2017-02-18 NOTE — Telephone Encounter (Signed)
Pt is calling and did not wait to see md at [facility] yesterday. Pt would like her blood work,, Chest xray and ekg results from [facility].

## 2017-02-19 NOTE — Telephone Encounter (Signed)
I spoke with pt and went over below information. I ordered a nebulizing machine with [facility] in [place].

## 2017-02-20 ENCOUNTER — Telehealth: Payer: Self-pay | Admitting: Family Medicine

## 2017-02-20 NOTE — Telephone Encounter (Signed)
Patient needs nebulizer and nebulizer kit prescription sent [facility] in [facility]  Below is the fax number   [phone]

## 2017-02-23 ENCOUNTER — Telehealth: Payer: Self-pay

## 2017-02-23 ENCOUNTER — Ambulatory Visit (INDEPENDENT_AMBULATORY_CARE_PROVIDER_SITE_OTHER): Payer: Self-pay

## 2017-02-23 DIAGNOSIS — Z4502 Encounter for adjustment and management of automatic implantable cardiac defibrillator: Secondary | ICD-10-CM

## 2017-02-23 DIAGNOSIS — I5042 Chronic combined systolic (congestive) and diastolic (congestive) heart failure: Secondary | ICD-10-CM

## 2017-02-23 NOTE — Telephone Encounter (Signed)
Remote ICM transmission received.  Attempted call to patient and no answer   

## 2017-02-23 NOTE — Progress Notes (Addendum)
EPIC Encounter for ICM Monitoring  Patient Name: Sydney Phillips is a 81 y.o. female Date: 02/23/2017 Primary Care Physican: Laurey Morale, MD Primary Cardiologist:Croitoru Electrophysiologist: Croitoru DryWeight: unknown Bi-V Pacing: 96.9%      Attempted call to patient and unable to reach.    Transmission reviewed.    Thoracic impedance abnormal suggesting fluid accumulation but is trending back close to baseline today.  Optivol impedance continues to show large peaks and valleys.  Prescribed dosage: Furosemide 80 mg 1 tablet every AM and 0.5 tablet (40 mg total) every PM. Potassium 20 mEq 1 tablet twice a day.  Recommendations: NONE - Unable to reach patient   Follow-up plan: ICM clinic phone appointment on 04/27/2017 due to patient has generator change scheduled for 03/04/2017.  Copy of ICM check sent to Dr. Sallyanne Kuster.   3 month ICM trend: 02/23/2017   1 Year ICM trend:      [name], RN 02/23/2017 4:15 PM

## 2017-02-24 NOTE — Telephone Encounter (Signed)
I spoke with pt and faxed to below number.

## 2017-02-24 NOTE — Telephone Encounter (Signed)
I left a voice message for pt to return my call. We sent order to [facility] for machine on 02/19/2017, the medication for this was sent to [facility] on 02/18/2017.

## 2017-02-25 DIAGNOSIS — J439 Emphysema, unspecified: Secondary | ICD-10-CM | POA: Diagnosis not present

## 2017-02-25 DIAGNOSIS — J449 Chronic obstructive pulmonary disease, unspecified: Secondary | ICD-10-CM | POA: Diagnosis not present

## 2017-02-25 DIAGNOSIS — M5127 Other intervertebral disc displacement, lumbosacral region: Secondary | ICD-10-CM | POA: Diagnosis not present

## 2017-02-25 DIAGNOSIS — Z4502 Encounter for adjustment and management of automatic implantable cardiac defibrillator: Secondary | ICD-10-CM | POA: Diagnosis not present

## 2017-02-25 DIAGNOSIS — D689 Coagulation defect, unspecified: Secondary | ICD-10-CM | POA: Diagnosis not present

## 2017-02-25 DIAGNOSIS — Z79899 Other long term (current) drug therapy: Secondary | ICD-10-CM | POA: Diagnosis not present

## 2017-02-26 LAB — BASIC METABOLIC PANEL
BUN / CREAT RATIO: 16 (ref 12–28)
BUN: 18 mg/dL (ref 8–27)
CALCIUM: 9.9 mg/dL (ref 8.7–10.3)
CO2: 26 mmol/L (ref 20–29)
Chloride: 101 mmol/L (ref 96–106)
Creatinine, Ser: 1.14 mg/dL — ABNORMAL HIGH (ref 0.57–1.00)
GFR, EST AFRICAN AMERICAN: 53 mL/min/{1.73_m2} — AB (ref >59)
GFR, EST NON AFRICAN AMERICAN: 46 mL/min/{1.73_m2} — AB (ref >59)
Glucose: 110 mg/dL — ABNORMAL HIGH (ref 65–99)
POTASSIUM: 4.7 mmol/L (ref 3.5–5.2)
Sodium: 141 mmol/L (ref 134–144)

## 2017-02-26 LAB — CBC
HEMATOCRIT: 40.9 % (ref 34.0–46.6)
HEMOGLOBIN: 13.6 g/dL (ref 11.1–15.9)
MCH: 30.2 pg (ref 26.6–33.0)
MCHC: 33.3 g/dL (ref 31.5–35.7)
MCV: 91 fL (ref 79–97)
Platelets: 294 10*3/uL (ref 150–379)
RBC: 4.5 x10E6/uL (ref 3.77–5.28)
RDW: 16.2 % — ABNORMAL HIGH (ref 12.3–15.4)
WBC: 6.3 10*3/uL (ref 3.4–10.8)

## 2017-02-26 LAB — PROTIME-INR
INR: 1.1 (ref 0.8–1.2)
Prothrombin Time: 11.1 s (ref 9.1–12.0)

## 2017-02-27 ENCOUNTER — Telehealth: Payer: Self-pay | Admitting: *Deleted

## 2017-02-27 ENCOUNTER — Telehealth: Payer: Self-pay

## 2017-02-27 NOTE — Telephone Encounter (Signed)
Received call from patient calling for lab results.Advised recent lab results ok.

## 2017-02-27 NOTE — Telephone Encounter (Addendum)
Left message to call back for results, per DPR.   ----- Message from [name], MD sent at 02/26/2017 12:04 PM EDT ----- Labs look OK

## 2017-03-03 ENCOUNTER — Other Ambulatory Visit: Payer: Self-pay | Admitting: Family Medicine

## 2017-03-04 ENCOUNTER — Encounter (HOSPITAL_COMMUNITY)
Admission: RE | Disposition: A | Discharge status: Home / Self Care | Payer: Self-pay | Source: Ambulatory Visit | Attending: Cardiovascular Disease

## 2017-03-04 ENCOUNTER — Ambulatory Visit (HOSPITAL_COMMUNITY)
Admission: RE | Admit: 2017-03-04 | Discharge: 2017-03-04 | Disposition: A | Discharge status: Home / Self Care | Payer: Medicare Other | Source: Ambulatory Visit | Attending: Cardiovascular Disease | Admitting: Cardiovascular Disease

## 2017-03-04 ENCOUNTER — Encounter (HOSPITAL_COMMUNITY): Payer: Self-pay | Admitting: Cardiology

## 2017-03-04 DIAGNOSIS — Z955 Presence of coronary angioplasty implant and graft: Secondary | ICD-10-CM | POA: Insufficient documentation

## 2017-03-04 DIAGNOSIS — E1122 Type 2 diabetes mellitus with diabetic chronic kidney disease: Secondary | ICD-10-CM | POA: Insufficient documentation

## 2017-03-04 DIAGNOSIS — I447 Left bundle-branch block, unspecified: Secondary | ICD-10-CM | POA: Diagnosis not present

## 2017-03-04 DIAGNOSIS — I130 Hypertensive heart and chronic kidney disease with heart failure and stage 1 through stage 4 chronic kidney disease, or unspecified chronic kidney disease: Secondary | ICD-10-CM | POA: Insufficient documentation

## 2017-03-04 DIAGNOSIS — D689 Coagulation defect, unspecified: Secondary | ICD-10-CM | POA: Insufficient documentation

## 2017-03-04 DIAGNOSIS — I428 Other cardiomyopathies: Secondary | ICD-10-CM | POA: Diagnosis not present

## 2017-03-04 DIAGNOSIS — F0390 Unspecified dementia without behavioral disturbance: Secondary | ICD-10-CM | POA: Insufficient documentation

## 2017-03-04 DIAGNOSIS — Z880 Allergy status to penicillin: Secondary | ICD-10-CM | POA: Diagnosis not present

## 2017-03-04 DIAGNOSIS — M797 Fibromyalgia: Secondary | ICD-10-CM | POA: Diagnosis not present

## 2017-03-04 DIAGNOSIS — E1151 Type 2 diabetes mellitus with diabetic peripheral angiopathy without gangrene: Secondary | ICD-10-CM | POA: Diagnosis not present

## 2017-03-04 DIAGNOSIS — G8929 Other chronic pain: Secondary | ICD-10-CM | POA: Diagnosis not present

## 2017-03-04 DIAGNOSIS — N183 Chronic kidney disease, stage 3 (moderate): Secondary | ICD-10-CM | POA: Diagnosis not present

## 2017-03-04 DIAGNOSIS — F17210 Nicotine dependence, cigarettes, uncomplicated: Secondary | ICD-10-CM | POA: Diagnosis not present

## 2017-03-04 DIAGNOSIS — K5909 Other constipation: Secondary | ICD-10-CM | POA: Diagnosis not present

## 2017-03-04 DIAGNOSIS — F418 Other specified anxiety disorders: Secondary | ICD-10-CM | POA: Diagnosis not present

## 2017-03-04 DIAGNOSIS — I5042 Chronic combined systolic (congestive) and diastolic (congestive) heart failure: Secondary | ICD-10-CM | POA: Diagnosis not present

## 2017-03-04 DIAGNOSIS — Z7982 Long term (current) use of aspirin: Secondary | ICD-10-CM | POA: Diagnosis not present

## 2017-03-04 DIAGNOSIS — Z4502 Encounter for adjustment and management of automatic implantable cardiac defibrillator: Secondary | ICD-10-CM | POA: Diagnosis not present

## 2017-03-04 DIAGNOSIS — I2510 Atherosclerotic heart disease of native coronary artery without angina pectoris: Secondary | ICD-10-CM | POA: Diagnosis not present

## 2017-03-04 DIAGNOSIS — E7800 Pure hypercholesterolemia, unspecified: Secondary | ICD-10-CM | POA: Diagnosis not present

## 2017-03-04 DIAGNOSIS — Z01818 Encounter for other preprocedural examination: Secondary | ICD-10-CM | POA: Insufficient documentation

## 2017-03-04 DIAGNOSIS — J449 Chronic obstructive pulmonary disease, unspecified: Secondary | ICD-10-CM | POA: Insufficient documentation

## 2017-03-04 HISTORY — PX: ICD GENERATOR CHANGEOUT: EP1231

## 2017-03-04 HISTORY — DX: Unspecified asthma, uncomplicated: J45.909

## 2017-03-04 HISTORY — DX: Dyspnea, unspecified: R06.00

## 2017-03-04 LAB — SURGICAL PCR SCREEN
MRSA, PCR: POSITIVE — AB
Staphylococcus aureus: POSITIVE — AB

## 2017-03-04 LAB — GLUCOSE, CAPILLARY: Glucose-Capillary: 103 mg/dL — ABNORMAL HIGH (ref 65–99)

## 2017-03-04 SURGERY — ICD GENERATOR CHANGEOUT

## 2017-03-04 MED ORDER — MUPIROCIN 2 % EX OINT
1.0000 "application " | TOPICAL_OINTMENT | Freq: Once | CUTANEOUS | Status: AC
  Administered 2017-03-04: 1 via TOPICAL

## 2017-03-04 MED ORDER — CHLORHEXIDINE GLUCONATE 4 % EX LIQD
60.0000 mL | Freq: Once | CUTANEOUS | Status: DC
  Filled 2017-03-04: qty 60

## 2017-03-04 MED ORDER — LIDOCAINE HCL (PF) 1 % IJ SOLN
INTRAMUSCULAR | Status: AC
  Filled 2017-03-04: qty 60

## 2017-03-04 MED ORDER — SODIUM CHLORIDE 0.9 % IV SOLN
INTRAVENOUS | Status: DC
  Administered 2017-03-04: 15:00:00 via INTRAVENOUS

## 2017-03-04 MED ORDER — SODIUM CHLORIDE 0.9 % IV SOLN
250.0000 mL | INTRAVENOUS | Status: DC | PRN
Start: 2017-03-04 — End: 2017-03-04

## 2017-03-04 MED ORDER — LIDOCAINE HCL (PF) 1 % IJ SOLN
INTRAMUSCULAR | Status: DC | PRN
  Administered 2017-03-04: 45 mL

## 2017-03-04 MED ORDER — MIDAZOLAM HCL 5 MG/5ML IJ SOLN
INTRAMUSCULAR | Status: AC
Start: 2017-03-04 — End: 2017-03-04
  Filled 2017-03-04: qty 5

## 2017-03-04 MED ORDER — MUPIROCIN 2 % EX OINT
TOPICAL_OINTMENT | CUTANEOUS | Status: AC
  Administered 2017-03-04: 1 via TOPICAL
  Filled 2017-03-04: qty 22

## 2017-03-04 MED ORDER — VANCOMYCIN HCL IN DEXTROSE 1-5 GM/200ML-% IV SOLN
INTRAVENOUS | Status: AC
  Filled 2017-03-04: qty 200

## 2017-03-04 MED ORDER — VANCOMYCIN HCL IN DEXTROSE 1-5 GM/200ML-% IV SOLN
1000.0000 mg | INTRAVENOUS | Status: AC
  Administered 2017-03-04: 1000 mg via INTRAVENOUS

## 2017-03-04 MED ORDER — ACETAMINOPHEN 325 MG PO TABS
325.0000 mg | ORAL_TABLET | ORAL | Status: DC | PRN
  Filled 2017-03-04: qty 2

## 2017-03-04 MED ORDER — FENTANYL CITRATE (PF) 100 MCG/2ML IJ SOLN
INTRAMUSCULAR | Status: AC
  Filled 2017-03-04: qty 2

## 2017-03-04 MED ORDER — SODIUM CHLORIDE 0.9% FLUSH: 3.0000 mL | Freq: Two times a day (BID) | INTRAVENOUS | Status: DC

## 2017-03-04 MED ORDER — SODIUM CHLORIDE 0.9% FLUSH: 3.0000 mL | INTRAVENOUS | Status: DC | PRN

## 2017-03-04 MED ORDER — SODIUM CHLORIDE 0.9 % IR SOLN
80.0000 mg | Status: AC
  Administered 2017-03-04: 80 mg

## 2017-03-04 MED ORDER — SODIUM CHLORIDE 0.9 % IR SOLN
Status: AC
  Filled 2017-03-04: qty 2

## 2017-03-04 MED ORDER — ONDANSETRON HCL 4 MG/2ML IJ SOLN: 4.0000 mg | Freq: Four times a day (QID) | INTRAMUSCULAR | Status: DC | PRN

## 2017-03-04 SURGICAL SUPPLY — 4 items
CABLE SURGICAL S-101-97-12 (CABLE) ×2 IMPLANT
ICD CLARIA MRI DTMA1D1 (ICD Generator) ×2 IMPLANT
PAD DEFIB LIFELINK (PAD) ×2 IMPLANT
TRAY PACEMAKER INSERTION (PACKS) ×2 IMPLANT

## 2017-03-04 NOTE — Interval H&P Note (Signed)
History and Physical Interval Note:  03/04/2017 2:06 PM  Sydney Phillips  has presented today for surgery, with the diagnosis of battery depletion  The various methods of treatment have been discussed with the patient and family. After consideration of risks, benefits and other options for treatment, the patient has consented to  Procedure(s): ICD [employer] (N/A) as a surgical intervention .  The patient's history has been reviewed, patient examined, no change in status, stable for surgery.  I have reviewed the patient's chart and labs.  Questions were answered to the patient's satisfaction.     [name]

## 2017-03-04 NOTE — H&P (View-Only) (Signed)
Patient ID: Sydney Phillips, female   DOB: July 11, 1935, 81 y.o.   MRN: [id number]    Cardiology Office Note    Date:  02/08/2017   ID:  Sydney, Phillips Mar 07, 1936, MRN [id number]  PCP:  Laurey Morale, MD  Cardiologist:   [name], MD   No chief complaint on file.   History of Present Illness:  [name] is a 81 y.o. female with cardiomyopathy here for CHF follow up, check on CRT-D device, Recent hospitalizations in November 2017, December 2017 and January 2018.   Since then she has had a fairly steady course clinically, although her [name] shows broad swings and thoracic impedance. Most recently there was a marked increase in thoracic impedance in early July suggestive of volume reduction, now back to baseline. Because of his throat swelling, or thoracic impedance suggests volume overload currently, but I think we are finally reaching some level of stability. Her activity level is increasing and is now up to 1.6 hours per day, much better than it was in the spring. She reports feeling well and denies problems with orthopnea, PND or leg edema. She has not had dizziness or syncope. She denies falls.  She has 99% biventricular pacing and requires atrial only 4% of the time. Her device has not recorded any ventricular tachycardia or atrial fibrillation.   Her device just reached RRT a few days ago on August 20. Lead parameters remain excellent, other than the relatively high but stable left ventricular pacing threshold of 1.875 V at 1 ms.  She has mixed ischemic and nonischemic CMP and has been CRT "hyper-responder". In 2009 her ejection fraction was 20%. After CRT, her LV improved and in 2013 EF was 50% and she had several years of relative cardiac stability. She received drug-eluting stents to the right coronary artery and left circumflex coronary artery on 05/20/2016, still receiving dual antiplatelet therapy with aspirin and clopidogrel.  She has twin boys; [name] has  medical power of attorney  LVEF in September 2017 was 40-45%, then dropped to 25-30% in November 2017 when she had NSTEMI, in the setting of COPD exacerbation, respiratory failure, acute renal failure and syncope. Cardiac catheterization showed severe stenoses in the right coronary artery and left circumflex coronary artery both treated with drug-eluting stents. There was also moderate ostial stenosis of the LAD. Enrolled in Twilight study. Repeat echo in January 2018 showed EF 20-25% with a severely dilated left ventricle, moderate mitral regurgitation, systolic PA pressure 53 mmHg. Was readmitted on December 15 with altered mental status, suspicion of steroid induced psychosis and discharged to skilled nursing facility, but again seen in the emergency room on December 19 after a fall with head injury. Gradually improved mental status and felt to be back at baseline by early January. After that hospitalization for diuretics were held for acute renal insufficiency and she was admitted on January 11 with acute exacerbation of heart failure. Improved with diuretics and discharged on January 15 with a weight of 77.4 kg (171 lb).     Past Medical History:  Diagnosis Date  . Arthritis   . Automatic implantable cardioverter-defibrillator in situ   . Bronchitis   . CAD (coronary artery disease)    a. s/p DES to LCx/RCA 05/2016, ostial LAD disease.  . Cardiomyopathy ([place])   . Chronic back pain   . Chronic combined systolic and diastolic CHF (congestive heart failure) ([place])   . Chronic constipation   . Chronic pain   . CKD (  chronic kidney disease), stage III   . Colon polyps 2003.  2015.   HP polyps 2003.  adnomas 2015.  required referal to [facility] for colonoscopic resection of flat polyps.   Marland Kitchen COPD (chronic obstructive pulmonary disease) ([place])   . Dementia   . Depression with anxiety    takes Cymbalta daily  . Diabetes mellitus ([place])   . Early cataracts, bilateral   . Fibromyalgia   . GERD  (gastroesophageal reflux disease)    was on meds but was taken off;now watches what she eats  . Hemorrhoids   . History of kidney stones   . Hx of colonic polyps   . Hyperlipemia    takes Crestor daily  . Hypertension    takes Amlodipine and Metoprolol daily  . Insomnia   . LBBB (left bundle branch block)    Stress test 09/03/2010, EF 55  . Myocardial infarction ([place])   . PAD (peripheral artery disease) (H[facility])    Carotid, subclavian, and lower extremity beds, currently not symptomatic  . Presence of combination internal cardiac defibrillator (ICD) and pacemaker   . Presence of permanent cardiac pacemaker   . Pulmonary hypertension ([place])   . S/P angioplasty with stent, lt. subclavian 07/31/11 08/01/2011  . Subclavian arterial stenosis, lt, with PTA/STENT 07/31/11 08/01/2011  . Syncope 07/28/2011   EF - 50-55, moderate concentric hypertrophy in left ventricle  . Urinary incontinence   . Vertigo    takes Meclizine prn    Past Surgical History:  Procedure Laterality Date  . ABDOMINAL AORTAGRAM N/A 08/12/2013   Procedure: ABDOMINAL Maxcine Ham;  Surgeon: Elam Dutch, MD;  Location: [facility] CATH LAB;  Service: Cardiovascular;  Laterality: N/A;  . ABDOMINAL HYSTERECTOMY    . APPENDECTOMY    . BACK SURGERY  2012  . BIV ICD GENERTAOR CHANGE OUT Left 02/20/2012   Procedure: BIV ICD GENERTAOR CHANGE OUT;  Surgeon: [name], MD;  Location[facility] CATH LAB;  Service: Cardiovascular;  Laterality: Left;  . CARDIAC CATHETERIZATION  12/01/2007   By Dr. Melvern Banker, left heart cath,   . CARDIAC CATHETERIZATION N/A 05/20/2016   Procedure: Right/Left Heart Cath and Coronary Angiography;  Surgeon: Sherren Mocha, MD;  Location: [facility] CV LAB;  Service: Cardiovascular;  Laterality: N/A;  . CARDIAC CATHETERIZATION N/A 05/20/2016   Procedure: Coronary Stent Intervention;  Surgeon: Sherren Mocha, MD;  Location: [place] CV LAB;  Service: Cardiovascular;  Laterality: N/A;  . CARDIAC DEFIBRILLATOR PLACEMENT   05/2008   By Dr Blanch Media, Medtronic CANNOT HAVE MRI's  . CAROTID ANGIOGRAM N/A 07/31/2011   Procedure: CAROTID ANGIOGRAM;  Surgeon: Lorretta Harp, MD;  Location: [facility] CATH LAB;  Service: Cardiovascular;  Laterality: N/A;  carotid angiogram and possible Lt SCA PTA  . COLONOSCOPY W/ POLYPECTOMY  12/2013  . CORONARY ANGIOPLASTY    . ENDARTERECTOMY Left 11/08/2014   Procedure: LEFT CAROTID ENDARTERECTOMY WITH HEMASHIELD PATCH ANGIOPLASTY;  Surgeon: Elam Dutch, MD;  Location: [facility];  Service: Vascular;  Laterality: Left;  . ESOPHAGOGASTRODUODENOSCOPY N/A 01/20/2014   Procedure: ESOPHAGOGASTRODUODENOSCOPY (EGD);  Surgeon: Jerene Bears, MD;  Location[facility] ENDOSCOPY;  Service: Endoscopy;  Laterality: N/A;  . FEMORAL-POPLITEAL BYPASS GRAFT Right 10/12/2013   Procedure:   Femoral-Peroneal trunk  bypass with nonreversed greater saphenous vein graft;  Surgeon: Elam Dutch, MD;  Location: [place];  Service: Vascular;  Laterality: Right;  . GIVENS CAPSULE STUDY N/A 01/20/2014   Procedure: GIVENS CAPSULE STUDY;  Surgeon: Jerene Bears, MD;  Location: [facility] ENDOSCOPY;  Service: Gastroenterology;  Laterality: N/A;  . INTRAOPERATIVE ARTERIOGRAM Right 10/12/2013   Procedure: INTRA OPERATIVE ARTERIOGRAM;  Surgeon: Elam Dutch, MD;  Location: [facility];  Service: Vascular;  Laterality: Right;  . ORIF ELBOW FRACTURE  08/16/2011   Procedure: OPEN REDUCTION INTERNAL FIXATION (ORIF) ELBOW/OLECRANON FRACTURE;  Surgeon: Schuyler Amor, MD;  Location: [place];  Service: Orthopedics;  Laterality: Left;  . RENAL ANGIOGRAM N/A 08/12/2013   Procedure: RENAL ANGIOGRAM;  Surgeon: Elam Dutch, MD;  Location: [facility] CATH LAB;  Service: Cardiovascular;  Laterality: N/A;  . SUBCLAVIAN STENT PLACEMENT Left 07/31/2011   7x18 Genesis, balloon, with reduction of 90% ostial left subclavian artery stenosis to 0% with residual excellent flow  . TONSILLECTOMY    . TUBAL LIGATION      Current Medications: Outpatient Medications Prior to  Visit  Medication Sig Dispense Refill  . albuterol (PROVENTIL HFA;VENTOLIN HFA) 108 (90 Base) MCG/ACT inhaler Inhale 2 puffs into the lungs every 4 (four) hours as needed for wheezing or shortness of breath. 1 Inhaler 5  . allopurinol (ZYLOPRIM) 300 MG tablet TAKE ONE TABLET BY MOUTH ONCE DAILY 30 tablet 11  . ALPRAZolam (XANAX) 1 MG tablet TAKE 1 TABLET BY MOUTH EVERY 6 HOURS AS NEEDED FOR  ANXIETY 120 tablet 5  . aspirin 81 MG chewable tablet Chew 1 tablet (81 mg total) by mouth daily. 30 tablet 1  . atorvastatin (LIPITOR) 40 MG tablet Take 1 tablet (40 mg total) by mouth daily at 6 PM. 90 tablet 3  . bisoprolol (ZEBETA) 5 MG tablet Take 1 tablet (5 mg total) by mouth daily. 90 tablet 3  . clopidogrel (PLAVIX) 75 MG tablet Take 1 tablet (75 mg total) by mouth daily. 60 tablet 11  . DULoxetine (CYMBALTA) 60 MG capsule Take 60 mg by mouth 2 (two) times daily.    . fluticasone (FLONASE) 50 MCG/ACT nasal spray Place 2 sprays into both nostrils daily as needed for allergies. 16 g 11  . furosemide (LASIX) 80 MG tablet Take 80mg  in AM, 40mg  in PM. 60 tablet 4  . Magnesium Oxide 400 MG CAPS Take 1 capsule (400 mg total) by mouth 2 (two) times daily. 180 capsule 3  . nitroGLYCERIN (NITROSTAT) 0.4 MG SL tablet Place 0.4 mg under the tongue every 5 (five) minutes as needed for chest pain. X 3 doses    . potassium chloride SA (K-DUR,KLOR-CON) 20 MEQ tablet Take 1 tablet (20 mEq total) by mouth 2 (two) times daily. 180 tablet 3  . traZODone (DESYREL) 50 MG tablet Take 1 tablet (50 mg total) by mouth at bedtime. 30 tablet 5  . HYDROcodone-acetaminophen (NORCO) 10-325 MG tablet Take one tablet by mouth every 6 hours as needed for pain. DNE 3gm of APAP from all sources/24hours (Patient not taking: Reported on 02/06/2017) 120 tablet 0   No facility-administered medications prior to visit.      Allergies:   Potassium-containing compounds; Azithromycin; Codeine; Darvon; Erythromycin; Meloxicam; Norco  [hydrocodone-acetaminophen]; Penicillins; Propoxyphene n-acetaminophen; Rofecoxib; Rosuvastatin; Statins; and Sulfa antibiotics   Social History   Social History  . Marital status: Widowed    Spouse name: N/A  . Number of children: 3  . Years of education: 12   Occupational History  . Retired    Social History Main Topics  . Smoking status: Current Every Day Smoker    Packs/day: 0.50    Years: 62.00    Types: Cigarettes  . Smokeless tobacco: Never Used     Comment: Down  to 3-4 cigarettes per day  . Alcohol use No  . Drug use: No  . Sexual activity: No   Other Topics Concern  . None   Social History Narrative   Ok to share information with medical POA, Son Gabriel Carina   Right-handed   Caffeine: Pepsi     Family History:  The patient's family history includes CAD in her father; Cancer in her sister; Colon cancer in her maternal grandmother; Deep vein thrombosis in her son; Diabetes in her sister; Heart disease in her father, mother, and sister; Hyperlipidemia in her father and unknown relative.   ROS:   Please see the history of present illness.    ROS All other systems reviewed and are negative.   PHYSICAL EXAM:   VS:  BP (!) 142/72 (BP Location: Right Arm, Patient Position: Sitting, Cuff Size: Normal)   Pulse 75   Ht 5' 5.5" (1.664 m)   Wt 175 lb 12.8 oz (79.7 kg)   BMI 28.81 kg/m     General: Alert, oriented x3, no distress, Mildly overweight, Head: no evidence of trauma, very poor dentition, PERRL, EOMI, no exophtalmos or lid lag, no myxedema, no xanthelasma; normal ears, nose and oropharynx Neck: normal jugular venous pulsations and no hepatojugular reflux; brisk carotid pulses without delay and no carotid bruits Chest: clear to auscultation, no signs of consolidation by percussion or palpation, normal fremitus, symmetrical and full respiratory excursions Cardiovascular: normal position and quality of the apical impulse, regular rhythm, normal first and  paradoxically split second heart sounds, no murmurs, rubs or gallops. Healthy left subclavian defibrillator site Abdomen: no tenderness or distention, no masses by palpation, no abnormal pulsatility or arterial bruits, normal bowel sounds, no hepatosplenomegaly Extremities: no clubbing, cyanosis or edema; 2+ radial, ulnar and brachial pulses bilaterally; 2+ right femoral, posterior tibial and dorsalis pedis pulses; 2+ left femoral, posterior tibial and dorsalis pedis pulses; no subclavian or femoral bruits Neurological: grossly nonfocal Psych: euthymic mood, full affect  Wt Readings from Last 3 Encounters:  02/06/17 175 lb 12.8 oz (79.7 kg)  01/02/17 169 lb 3.2 oz (76.7 kg)  12/24/16 171 lb (77.6 kg)      Studies/Labs Reviewed:   EKG:  EKG is ordered today.  The ekg ordered today demonstrates Atrial sensed, biventricular paced rhythm, QTC 417 ms  Recent Labs: 05/14/2016: TSH 1.013 08/07/2016: Magnesium 1.9 09/25/2016: ALT 16 12/17/2016: B Natriuretic Peptide 1,060.6 12/18/2016: Hemoglobin 11.8; Platelets 241 01/08/2017: BUN 20; Creatinine, Ser 1.28; Potassium 4.9; Sodium 143   Lipid Panel    Component Value Date/Time   CHOL 212 (H) 12/19/2016 0736   TRIG 124 12/19/2016 0736   HDL 47 12/19/2016 0736   CHOLHDL 4.5 12/19/2016 0736   VLDL 25 12/19/2016 0736   LDLCALC 140 (H) 12/19/2016 0736   LDLDIRECT 152.0 05/30/2015 1452     ASSESSMENT:    1. ICD (implantable cardioverter-defibrillator) battery depletion   2. Chronic combined systolic (congestive) and diastolic (congestive) heart failure (HCC)   3. Atherosclerosis of native coronary artery of native heart without angina pectoris   4. PAD (peripheral artery disease) ([place])   5. Hypercholesterolemia   6. Other emphysema ([place])   7. Blood clotting disorder ([place])   8. Medication management      PLAN:  In order of problems listed above:  1. ICD at RRT: Discussed the generator change procedure, potential complications. She is  not device dependent. This procedure has been fully reviewed with the patient and informed consent has been obtained.  Will continue dual antiplatelet therapy throughout. 2  CHF: She has a relatively narrow margin of compensation. Seems to have reached a steady state based on both clinical data and her Optivol recordings. Her weight is a little bit higher than what we had estimated to her optimal "dry weight" appears to be (168-170 pounds), but she is asymptomatic and appears clinically to be euvolemic. Most recent EF around 25% on echo from July 2018, has not improved despite revascularization in December. She is on ACE inhibitor recent a highly selective beta blocker. Would like to avoid increasing these medications since I'm concerned about her tendency to hypotension and falls. 3. CAD s/p DES to RCA and LCX Dec 2017: on ASA and clopidogrel. Cautioned that she should not interrupt the antiplatelets at least for 6 months following her procedure (that is until 11/18/2016). There is good reason to consider lifelong aggressive antiplatelet therapy based on her anatomy, but note that she had a lot of problems with GI bleeding a couple of years ago. So far no bleeding complications in the roughly 8 months that have passed since her stents. We will not stop the dual antiplatelet therapy for her generator change. 4. PAD: History of left subclavian stent, should monitor blood pressure in the right arm. History of left carotid endarterectomy, followed by Dr. Oneida Alar. Asymptomatic decrease in left lower extremity ABI. 5. HLP: Has unsuccessfully tried to take statins in the past. High risk of progression of disease unless intervene and bring her LDL down to target under 70. We'll try to see if we can get funding for PCS K9 inhibitors, because she cannot afford the co-pay. . 6. COPD: Strongly encouraged to quit smoking again, but she is not ready to try    Medication Adjustments/Labs and Tests Ordered: Current  medicines are reviewed at length with the patient today.  Concerns regarding medicines are outlined above.  Medication changes, Labs and Tests ordered today are listed in the Patient Instructions below. Patient Instructions  Dr Sallyanne Kuster recommends that you have your ICD Generator changed out. It has been scheduled for Wednesday, September 19th, 2018 at 3:30p. You will need to be at the hospital by 1:30p so they can get you ready for the procedure. This will be done at [facility]. Instructions have been provided.     Signed, [name], MD  02/08/2017 2:40 PM    [facility] [address], [place], [place]  [address] Phone: [phone]; Fax: [phone]

## 2017-03-04 NOTE — Brief Op Note (Signed)
Procedure report  Procedure performed:  Dual chamber Biventricular ICD generator changeout   Reason for procedure:  1. Device generator at elective replacement interval  2. Chronic systolic heart failure and LBBB Procedure performed by:  [name], MD  Complications:  None  Estimated blood loss:  <5 mL  Medications administered during procedure:  Vancomycin 1 g intravenously, lidocaine 1% 30 mL locally  Device details:  [employer] number [id number], serial number [id number] Right atrial lead (chronic) [employer] [phone], serial number [id number] (implanted 05/17/2008) Right ventricular lead (chronic)  [employer] [phone], serial number [id number] (implanted 05/17/2008) Left ventricular lead  (chronic), Medtronic [id number], serial number [id number] (implanted 05/17/2008)  Explanted generator Medtronic Protecta XT CRT-D,  model number [id number], serial number  [id number] (implanted 02/20/2012)  Procedure details:  After the risks and benefits of the procedure were discussed the patient provided informed consent. She was brought to the cardiac catheter lab in the fasting state. The patient was prepped and draped in usual sterile fashion. Local anesthesia with 1% lidocaine was administered to to the left infraclavicular area. A 5-6cm horizontal incision was made parallel with and 2-3 cm caudal to the left clavicle, in the area of an old scar. Two older scars were seen closer to the left clavicle. Using minimal electrocautery and mostly sharp and blunt dissection the prepectoral pocket was opened carefully to avoid injury to the loops of chronic leads. Extensive dissection was not necessary. The device was explanted. The pocket was carefully inspected for hemostasis and flushed with copious amounts of antibiotic solution.  The leads were disconnected from the old generator and testing of the lead parameters showed excellent values. The new generator was connected to the  chronic leads, with appropriate pacing noted.   The entire system was then carefully inserted in the pocket with care been taking that the leads and device assumed a comfortable position without pressure on the incision. Great care was taken that the leads be located deep to the generator. The pocket was then closed in layers using 2 layers of 2-0 Vicryl and cutaneous staples after which a sterile dressing was applied.   At the end of the procedure the following lead parameters were encountered:   Right atrial lead sensed P waves 6.4 mV, impedance 559 ohms, threshold 0.4 at 0.5 ms pulse width.  Right ventricular lead sensed R waves  25.4 mV, impedance 966 ohms, threshold 0.6 at 0.5 ms pulse width. High voltage impedance proximal 66 ohm, distal 57 ohm.  Left ventricular lead sensed R waves  18.2 mV, impedance 498 ohms, threshold 1.8 at 1.0 ms pulse width (LV tip to RV coil, best threshold).  [name], MD, [facility] [phone] office [phone] pager

## 2017-03-04 NOTE — Discharge Instructions (Signed)
Supplemental Discharge Instructions for  Pacemaker/Defibrillator Patients  Activity No restrictions. DO wear your seatbelt, even if it crosses over the pacemaker site.  WOUND CARE - Keep the wound area clean and dry.  Remove the dressing the day after you return home (usually 48 hours after the procedure). - DO NOT SUBMERGE UNDER WATER UNTIL FULLY HEALED (no tub baths, hot tubs, swimming pools, etc.).  - You  may shower or take a sponge bath after the dressing is removed. DO NOT SOAK the area and do not allow the shower to directly spray on the site. - If you have staples, these will be removed in the office in 7-14 days. - If you have tape/steri-strips on your wound, these will fall off; do not pull them off prematurely.   - No bandage is needed on the site.  DO  NOT apply any creams, oils, or ointments to the wound area. - If you notice any drainage or discharge from the wound, any swelling, excessive redness or bruising at the site, or if you develop a fever > 101? F after you are discharged home, call the office at once.  Special Instructions - You are still able to use cellular telephones.  Avoid carrying your cellular phone near your device. - When traveling through airports, show security personnel your identification card to avoid being screened in the metal detectors.  - Avoid arc welding equipment, MRI testing (magnetic resonance imaging), TENS units (transcutaneous nerve stimulators).  Call the office for questions about other devices. - Avoid electrical appliances that are in poor condition or are not properly grounded. - Microwave ovens are safe to be near or to operate.  Additional information for defibrillator patients should your device go off: - If your device goes off ONCE and you feel fine afterward, notify the clinic at [phone]. - If your device goes off ONCE and you do not feel well afterward, call 911. - If your device goes off TWICE or more in one day, call  911.  DO NOT DRIVE YOURSELF OR A FAMILY MEMBER WITH A DEFIBRILLATOR TO THE HOSPITAL--CALL 911.

## 2017-03-05 ENCOUNTER — Encounter (HOSPITAL_COMMUNITY): Payer: Self-pay | Admitting: Cardiovascular Disease

## 2017-03-06 ENCOUNTER — Telehealth: Payer: Self-pay | Admitting: Cardiovascular Disease

## 2017-03-06 NOTE — Telephone Encounter (Signed)
Mrs.Bonadonna is returning a call Thanks

## 2017-03-12 DIAGNOSIS — J449 Chronic obstructive pulmonary disease, unspecified: Secondary | ICD-10-CM | POA: Diagnosis not present

## 2017-03-13 ENCOUNTER — Telehealth: Payer: Self-pay | Admitting: Family Medicine

## 2017-03-13 MED ORDER — HYDROCODONE-ACETAMINOPHEN 10-325 MG PO TABS: 1.0000 | ORAL_TABLET | Freq: Four times a day (QID) | ORAL | 0 refills | Status: DC | PRN

## 2017-03-13 NOTE — Telephone Encounter (Signed)
Script is ready for pick up here at front office and spoke with pt. 

## 2017-03-13 NOTE — Telephone Encounter (Signed)
Patient would like the below medication refilled.  HYDROcodone-acetaminophen (NORCO) 10-325 MG tablet

## 2017-03-13 NOTE — Telephone Encounter (Signed)
Done

## 2017-03-17 ENCOUNTER — Ambulatory Visit (INDEPENDENT_AMBULATORY_CARE_PROVIDER_SITE_OTHER): Payer: Medicare Other | Admitting: *Deleted

## 2017-03-17 DIAGNOSIS — I255 Ischemic cardiomyopathy: Secondary | ICD-10-CM

## 2017-03-17 DIAGNOSIS — I5043 Acute on chronic combined systolic (congestive) and diastolic (congestive) heart failure: Secondary | ICD-10-CM | POA: Diagnosis not present

## 2017-03-18 ENCOUNTER — Telehealth: Payer: Self-pay | Admitting: *Deleted

## 2017-03-18 LAB — CUP PACEART INCLINIC DEVICE CHECK
Date Time Interrogation Session: 20181003075228
HighPow Impedance: 54 Ohm
Implantable Lead Implant Date: 20091202
Implantable Lead Location: 753858
Implantable Lead Location: 753859
Implantable Lead Model: 185
Implantable Lead Model: 4470
Implantable Lead Serial Number: 635533
Implantable Pulse Generator Implant Date: 20180919
Lead Channel Impedance Value: 418 Ohm
Lead Channel Impedance Value: 456 Ohm
Lead Channel Pacing Threshold Amplitude: 0.75 V
Lead Channel Pacing Threshold Amplitude: 1.75 V
Lead Channel Pacing Threshold Pulse Width: 0.4 ms
MDC IDC LEAD IMPLANT DT: 20091202
MDC IDC LEAD IMPLANT DT: 20091202
MDC IDC LEAD LOCATION: 753860
MDC IDC LEAD SERIAL: 308680
MDC IDC MSMT LEADCHNL LV PACING THRESHOLD PULSEWIDTH: 0.4 ms
MDC IDC MSMT LEADCHNL RA PACING THRESHOLD AMPLITUDE: 0.5 V
MDC IDC MSMT LEADCHNL RA PACING THRESHOLD PULSEWIDTH: 0.4 ms
MDC IDC MSMT LEADCHNL RA SENSING INTR AMPL: 5 mV
MDC IDC MSMT LEADCHNL RV IMPEDANCE VALUE: 703 Ohm
MDC IDC MSMT LEADCHNL RV SENSING INTR AMPL: 20 mV

## 2017-03-18 NOTE — Telephone Encounter (Signed)
Left message for patient to call and schedule f/u CRTD generator change (03/04/17) with Dr. Loletha Grayer.  Needs to be done late December 2018--1st part of January, 2019

## 2017-03-18 NOTE — Progress Notes (Signed)
CRTD Wound check appointment. Staples removed. Wound without redness or edema. Incision edges approximated, wound well healed. Normal device function. Thresholds, sensing, and impedances consistent with implant measurements. Device programmed at chronic output values. Histogram distribution appropriate for patient and level of activity. No mode switches or ventricular arrhythmias noted. Bi-Vp 99%. Patient educated about wound care, arm mobility, shock plan. Msg sent to scheduler for 91 day f/u with [facility].

## 2017-03-20 ENCOUNTER — Ambulatory Visit (INDEPENDENT_AMBULATORY_CARE_PROVIDER_SITE_OTHER): Payer: Medicare Other | Admitting: Family Medicine

## 2017-03-20 ENCOUNTER — Encounter: Payer: Self-pay | Admitting: Family Medicine

## 2017-03-20 VITALS — BP 140/62 | Temp 98.8°F | Ht 65.5 in | Wt 180.0 lb

## 2017-03-20 DIAGNOSIS — G8929 Other chronic pain: Secondary | ICD-10-CM

## 2017-03-20 DIAGNOSIS — M5440 Lumbago with sciatica, unspecified side: Secondary | ICD-10-CM | POA: Diagnosis not present

## 2017-03-20 NOTE — Patient Instructions (Signed)
WE NOW OFFER   [facility] FAST TRACK!!!  SAME DAY Appointments for ACUTE CARE  Such as: Sprains, Injuries, cuts, abrasions, rashes, muscle pain, joint pain, back pain Colds, flu, sore throats, headache, allergies, cough, fever  Ear pain, sinus and eye infections Abdominal pain, nausea, vomiting, diarrhea, upset stomach Animal/insect bites  3 Easy Ways to Schedule: Walk-In Scheduling Call in scheduling Mychart Sign-up: [email or web address]         

## 2017-03-20 NOTE — Progress Notes (Signed)
   Subjective:    Patient ID: Sydney Phillips, female    DOB: 1935/09/22, 81 y.o.   MRN: [id number]  HPI Here for worsening pain and weakness in the left leg. She uses a walker to walk but sometimes she has trouble moving the left leg at all. She had a lumbar fusion in the past per Dr. Joya Salm, but she can no longer see him since his group does not accept Medicare. She takes Norco as needed.    Review of Systems  Respiratory: Negative.   Cardiovascular: Negative.   Musculoskeletal: Positive for back pain and gait problem.  Neurological: Positive for weakness.       Objective:   Physical Exam  Constitutional: She appears well-developed and well-nourished.  Walks with a walker   Cardiovascular: Normal rate, regular rhythm, normal heart sounds and intact distal pulses.   Pulmonary/Chest: Effort normal and breath sounds normal. No respiratory distress. She has no wheezes. She has no rales.  Musculoskeletal:  The lower back is not tender but ROM is limited. She is very tender over the left sciatic notch.          Assessment & Plan:  Sciatica. We will refer her to another neurosurgeon that will accept her insurance.  Alysia Penna, MD

## 2017-03-27 ENCOUNTER — Ambulatory Visit (INDEPENDENT_AMBULATORY_CARE_PROVIDER_SITE_OTHER): Payer: Medicare Other | Admitting: Family Medicine

## 2017-03-27 ENCOUNTER — Encounter: Payer: Self-pay | Admitting: Family Medicine

## 2017-03-27 VITALS — Temp 98.2°F | Ht 65.5 in | Wt 167.0 lb

## 2017-03-27 DIAGNOSIS — H6121 Impacted cerumen, right ear: Secondary | ICD-10-CM | POA: Diagnosis not present

## 2017-03-27 DIAGNOSIS — H60311 Diffuse otitis externa, right ear: Secondary | ICD-10-CM | POA: Diagnosis not present

## 2017-03-27 DIAGNOSIS — J449 Chronic obstructive pulmonary disease, unspecified: Secondary | ICD-10-CM | POA: Diagnosis not present

## 2017-03-27 MED ORDER — NEOMYCIN-POLYMYXIN-HC 3.5-10000-1 OT SUSP: 4.0000 [drp] | Freq: Four times a day (QID) | OTIC | 0 refills | Status: DC

## 2017-03-27 NOTE — Patient Instructions (Signed)
WE NOW OFFER    [facility] FAST TRACK!!!  SAME DAY Appointments for ACUTE CARE  Such as: Sprains, Injuries, cuts, abrasions, rashes, muscle pain, joint pain, back pain Colds, flu, sore throats, headache, allergies, cough, fever  Ear pain, sinus and eye infections Abdominal pain, nausea, vomiting, diarrhea, upset stomach Animal/insect bites  3 Easy Ways to Schedule: Walk-In Scheduling Call in scheduling Mychart Sign-up: [email or web address]         

## 2017-03-27 NOTE — Progress Notes (Signed)
   Subjective:    Patient ID: Sydney Phillips, female    DOB: June 09, 1936, 81 y.o.   MRN: [id number]  HPI Here for 3 days of pain in the right ear. No URI symptoms.    Review of Systems  Constitutional: Negative.   HENT: Positive for ear pain and hearing loss. Negative for congestion, ear discharge, postnasal drip, sinus pain, sinus pressure and sore throat.   Eyes: Negative.   Respiratory: Negative.        Objective:   Physical Exam  Constitutional: She is oriented to person, place, and time. She appears well-developed and well-nourished.  HENT:  Left Ear: External ear normal.  Nose: Nose normal.  Mouth/Throat: Oropharynx is clear and moist.  Right ear canal was partially blocked with cerumen. The canal is red. The TM is clear  Eyes: Conjunctivae are normal.  Neck: No thyromegaly present.  Pulmonary/Chest: Effort normal and breath sounds normal. No respiratory distress. She has no wheezes. She has no rales.  Lymphadenopathy:    She has no cervical adenopathy.  Neurological: She is alert and oriented to person, place, and time.          Assessment & Plan:  Cerumen impaction and otitis externa. The cerumen was removed with a speculum. Treat the infection with Cortisporin Otic drops.  Alysia Penna, MD

## 2017-04-01 ENCOUNTER — Encounter: Payer: Self-pay | Admitting: Cardiology

## 2017-04-01 ENCOUNTER — Telehealth: Payer: Self-pay | Admitting: Cardiovascular Disease

## 2017-04-01 ENCOUNTER — Ambulatory Visit (INDEPENDENT_AMBULATORY_CARE_PROVIDER_SITE_OTHER): Payer: Medicare Other | Admitting: Cardiology

## 2017-04-01 VITALS — BP 140/66 | HR 84 | Ht 65.5 in | Wt 180.0 lb

## 2017-04-01 DIAGNOSIS — I10 Essential (primary) hypertension: Secondary | ICD-10-CM | POA: Diagnosis not present

## 2017-04-01 DIAGNOSIS — I255 Ischemic cardiomyopathy: Secondary | ICD-10-CM | POA: Diagnosis not present

## 2017-04-01 DIAGNOSIS — R410 Disorientation, unspecified: Secondary | ICD-10-CM

## 2017-04-01 DIAGNOSIS — Z9861 Coronary angioplasty status: Secondary | ICD-10-CM | POA: Diagnosis not present

## 2017-04-01 DIAGNOSIS — E118 Type 2 diabetes mellitus with unspecified complications: Secondary | ICD-10-CM

## 2017-04-01 DIAGNOSIS — I2510 Atherosclerotic heart disease of native coronary artery without angina pectoris: Secondary | ICD-10-CM | POA: Diagnosis not present

## 2017-04-01 DIAGNOSIS — K089 Disorder of teeth and supporting structures, unspecified: Secondary | ICD-10-CM | POA: Diagnosis not present

## 2017-04-01 DIAGNOSIS — N183 Chronic kidney disease, stage 3 unspecified: Secondary | ICD-10-CM

## 2017-04-01 DIAGNOSIS — J438 Other emphysema: Secondary | ICD-10-CM

## 2017-04-01 DIAGNOSIS — Z95810 Presence of automatic (implantable) cardiac defibrillator: Secondary | ICD-10-CM

## 2017-04-01 NOTE — Telephone Encounter (Signed)
Pt seen today by Lurena Joiner, calling w supplemental information.

## 2017-04-01 NOTE — Assessment & Plan Note (Signed)
NIDDM 

## 2017-04-01 NOTE — Progress Notes (Signed)
Thanks MCr 

## 2017-04-01 NOTE — Telephone Encounter (Signed)
Spoke w Crystal at Dr. Melida Quitter office, confirmed fax number, sent electronic documentation to fax # [phone].

## 2017-04-01 NOTE — Telephone Encounter (Signed)
New message    Pt is calling stating that her dental surgery is going to be Nov. 27th, at 11:10am.

## 2017-04-01 NOTE — Assessment & Plan Note (Signed)
Still smoking-< 1/2 pk a day

## 2017-04-01 NOTE — Assessment & Plan Note (Signed)
Controlled.  

## 2017-04-01 NOTE — Assessment & Plan Note (Deleted)
EF 20-25% July 2018 (pre BiV ICD change out)

## 2017-04-01 NOTE — Telephone Encounter (Signed)
Pt is cleared for dental surgery from cardiac standpoint. Please send a clearance not to Dr Lorretta Harp surgeon.  Kerin Ransom PA-C 04/01/2017 3:59 PM

## 2017-04-01 NOTE — Assessment & Plan Note (Signed)
Pt's dentures were apparently lost in the hospital in Nov. She needs oral surgery to remove her remaining teeth.

## 2017-04-01 NOTE — Progress Notes (Signed)
04/01/2017 Sydney Phillips   10/17/1935  [id number]  Primary Physician Laurey Morale, MD Primary Cardiologist: Dr Sallyanne Kuster  HPI:  Complicated patient with multiple medical problems follow ed by Dr Sallyanne Kuster. She was hospitalized in Nov 0254 with an MI complicated by respiratory failure and acute on chronic renal injury. She received an RCA and CFX DES. She has know mixed cardiomyopathy and had a BiV ICD placed in 2009. She was a responder, her EF was 55% in 2015, but after her MI in Nov 2017 her EF was 20-25%. She just recently had a BiV ICD Gen change 03/04/17 secondary to EOL. The pt is in the office today for a post hospital follow up. Overall she is doing well from a cardiac standpoint. She denies any unusual chest pain or dyspnea. She continues to smoke but has cut back to < 1/2 pk a day. She lost her dentures during one of her admissions. She has been seeing Dr Laurena Slimmer with oral surgery. The pt told me he wants to pull her remaining teeth and that she will need clearance for this, but the pt says she can't afford it at this time. She would not have to stop her Plavix for this.    Current Outpatient Prescriptions  Medication Sig Dispense Refill  . ACCU-CHEK FASTCLIX LANCETS MISC USE   TO CHECK GLUCOSE ONCE DAILY 102 each 1  . ACCU-CHEK SMARTVIEW test strip CHECK BLOOD SUGAR ONCE DAILY 100 each 1  . acetaminophen (TYLENOL) 325 MG tablet Take 325 mg by mouth every 6 (six) hours as needed for mild pain.    Marland Kitchen albuterol (PROVENTIL) (2.5 MG/3ML) 0.083% nebulizer solution Take 3 mLs (2.5 mg total) by nebulization every 4 (four) hours as needed for wheezing or shortness of breath. 75 mL 12  . allopurinol (ZYLOPRIM) 300 MG tablet TAKE ONE TABLET BY MOUTH ONCE DAILY (Patient taking differently: TAKE 300 mg BY MOUTH ONCE DAILY) 30 tablet 11  . ALPRAZolam (XANAX) 1 MG tablet TAKE 1 TABLET BY MOUTH EVERY 6 HOURS AS NEEDED FOR  ANXIETY (Patient taking differently: TAKE 1mg   BY MOUTH EVERY 6 HOURS  AS NEEDED FOR  ANXIETY) 120 tablet 5  . aspirin 81 MG chewable tablet Chew 1 tablet (81 mg total) by mouth daily. 30 tablet 1  . atorvastatin (LIPITOR) 40 MG tablet Take 1 tablet (40 mg total) by mouth daily at 6 PM. 90 tablet 3  . bisoprolol (ZEBETA) 5 MG tablet Take 1 tablet (5 mg total) by mouth daily. 90 tablet 3  . clopidogrel (PLAVIX) 75 MG tablet Take 1 tablet (75 mg total) by mouth daily. 60 tablet 11  . DULoxetine (CYMBALTA) 60 MG capsule TAKE ONE CAPSULE BY MOUTH TWICE DAILY (Patient taking differently: TAKE 60 mg  BY MOUTH TWICE DAILY) 60 capsule 11  . fluticasone (FLONASE) 50 MCG/ACT nasal spray Place 2 sprays into both nostrils daily as needed for allergies. 16 g 11  . furosemide (LASIX) 80 MG tablet Take 80mg  in AM, 40mg  in PM. 60 tablet 4  . HYDROcodone-acetaminophen (NORCO) 10-325 MG tablet Take 1 tablet by mouth every 6 (six) hours as needed for severe pain. 120 tablet 0  . Magnesium Oxide 400 MG CAPS Take 1 capsule (400 mg total) by mouth 2 (two) times daily. 180 capsule 3  . neomycin-polymyxin-hydrocortisone (CORTISPORIN) 3.5-10000-1 OTIC suspension Place 4 drops into the right ear 4 (four) times daily. 10 mL 0  . nitroGLYCERIN (NITROSTAT) 0.4 MG SL tablet Place 0.4 mg  under the tongue every 5 (five) minutes as needed for chest pain. X 3 doses    . OXYGEN Inhale 3.5 L into the lungs at bedtime.    . potassium chloride SA (K-DUR,KLOR-CON) 20 MEQ tablet Take 1 tablet (20 mEq total) by mouth 2 (two) times daily. 180 tablet 3  . PROAIR HFA 108 (90 Base) MCG/ACT inhaler Inhale 2 puffs into the lungs every 6 (six) hours as needed for wheezing or shortness of breath.     . traZODone (DESYREL) 50 MG tablet Take 1 tablet (50 mg total) by mouth at bedtime. 30 tablet 5   No current facility-administered medications for this visit.     Allergies  Allergen Reactions  . Potassium-Containing Compounds Other (See Comments)    Causes severe constipation  . Azithromycin Rash  . Codeine  Itching  . Darvon Itching  . Erythromycin Rash  . Meloxicam Other (See Comments)    Unknown    . Norco [Hydrocodone-Acetaminophen] Itching  . Penicillins Rash and Other (See Comments)    Has patient had a PCN reaction causing immediate rash, facial/tongue/throat swelling, SOB or lightheadedness with hypotension: unknown Has patient had a PCN reaction causing severe rash involving mucus membranes or skin necrosis: no Has patient had a PCN reaction that required hospitalization: unknown Has patient had a PCN reaction occurring within the last 10 years: no If all of the above answers are "NO", then may proceed with Cephalosporin use.   Marland Kitchen Propoxyphene N-Acetaminophen Itching  . Rofecoxib Other (See Comments)    Unknown    . Rosuvastatin Other (See Comments)    cramps  . Statins Itching and Other (See Comments)    Sleeplessness  . Sulfa Antibiotics Rash    Past Medical History:  Diagnosis Date  . Anxiety   . Arthritis   . Asthma   . Automatic implantable cardioverter-defibrillator in situ   . Bronchitis   . CAD (coronary artery disease)    a. s/p DES to LCx/RCA 05/2016, ostial LAD disease.  . Cardiomyopathy ([place])   . Chronic back pain   . Chronic combined systolic and diastolic CHF (congestive heart failure) ([place])   . Chronic constipation   . Chronic pain   . CKD (chronic kidney disease), stage III ([place])   . Colon polyps 2003.  2015.   HP polyps 2003.  adnomas 2015.  required referal to [facility] for colonoscopic resection of flat polyps.   Marland Kitchen COPD (chronic obstructive pulmonary disease) ([place])   . Dementia   . Depression   . Depression with anxiety    takes Cymbalta daily  . Diabetes mellitus ([place])   . Dyspnea   . Early cataracts, bilateral   . Fibromyalgia   . GERD (gastroesophageal reflux disease)    was on meds but was taken off;now watches what she eats  . Hemorrhoids   . History of kidney stones   . Hx of colonic polyps   . Hyperlipemia    takes Crestor daily  .  Hypertension    takes Amlodipine and Metoprolol daily  . Insomnia   . LBBB (left bundle branch block)    Stress test 09/03/2010, EF 55  . Myocardial infarction ([place])   . PAD (peripheral artery disease) ([facility])    Carotid, subclavian, and lower extremity beds, currently not symptomatic  . Presence of combination internal cardiac defibrillator (ICD) and pacemaker   . Presence of permanent cardiac pacemaker   . Pulmonary hypertension ([place])   . S/P angioplasty with stent, lt.  subclavian 07/31/11 08/01/2011  . Subclavian arterial stenosis, lt, with PTA/STENT 07/31/11 08/01/2011  . Syncope 07/28/2011   EF - 50-55, moderate concentric hypertrophy in left ventricle  . Urinary incontinence   . Vertigo    takes Meclizine prn    Social History   Social History  . Marital status: Widowed    Spouse name: N/A  . Number of children: 3  . Years of education: 12   Occupational History  . Retired    Social History Main Topics  . Smoking status: Current Every Day Smoker    Packs/day: 0.50    Years: 62.00    Types: Cigarettes  . Smokeless tobacco: Never Used  . Alcohol use No  . Drug use: No  . Sexual activity: No   Other Topics Concern  . Not on file   Social History Narrative   Ok to share information with medical POA, Son Sydney Phillips   Right-handed   Caffeine: Pepsi     Family History  Problem Relation Age of Onset  . CAD Father   . Heart disease Father   . Hyperlipidemia Father   . Heart disease Mother   . Deep vein thrombosis Son   . Hyperlipidemia Unknown   . Colon cancer Maternal Grandmother   . Cancer Sister        ovarian  . Diabetes Sister   . Heart disease Sister   . Anesthesia problems Neg Hx   . Hypotension Neg Hx   . Malignant hyperthermia Neg Hx   . Pseudochol deficiency Neg Hx      Review of Systems: General: negative for chills, fever, night sweats or weight changes.  Cardiovascular: negative for chest pain, dyspnea on exertion, edema, orthopnea,  palpitations, paroxysmal nocturnal dyspnea or shortness of breath Dermatological: negative for rash Respiratory: negative for cough or wheezing Urologic: negative for hematuria Abdominal: negative for nausea, vomiting, diarrhea, bright red blood per rectum, melena, or hematemesis Neurologic: negative for visual changes, syncope, or dizziness All other systems reviewed and are otherwise negative except as noted above.    Blood pressure 140/66, pulse 84, height 5' 5.5" (1.664 m), weight 180 lb (81.6 kg).  General appearance: alert, cooperative and no distress Lungs: decreased breath sounds Heart: regular rate and rhythm Extremities: no edema Skin: Skin color, texture, turgor normal. No rashes or lesions Neurologic: Grossly normal  ASSESSMENT AND PLAN:   r PLAN   ICM-s/p BiV ICD change out. Will arrange f/u with Dr Sallyanne Kuster in 3 months, I di not order a f/u echo. She is stable from a cardiac standpoint.   Poor dentition- I asked her to let us know when her surgery gets scheduled. As long a she doesn't have to stop her Plavix she would be an acceptable risk from a cardiac standpoint.   Kerin Ransom PA-C 04/01/2017 1:19 PM

## 2017-04-01 NOTE — Assessment & Plan Note (Addendum)
MDT BiV ICD placed in '09 (initially a responder but recent EF 20-25%). Gen change 03/04/17

## 2017-04-01 NOTE — Assessment & Plan Note (Signed)
RCA and CFX PCI with DES Nov 2017

## 2017-04-01 NOTE — Assessment & Plan Note (Signed)
EF 20-25% July 2018 (pre BiV ICD change out)

## 2017-04-01 NOTE — Patient Instructions (Addendum)
Medication Instructions:  Your physician recommends that you continue on your current medications as directed. Please refer to the Current Medication list given to you today.  Labwork: None   Testing/Procedures: None   Follow-Up: Your physician wants you to follow-up in: 3 Months with Dr Sallyanne Kuster. You will receive a reminder letter in the mail two months in advance. If you don't receive a letter, please call our office to schedule the follow-up appointment.  Any Other Special Instructions Will Be Listed Below (If Applicable).  CALL THE OFFICE ONCE YOU HAVE YOUR ORAL SURGERY SCHEDULED.  If you need a refill on your cardiac medications before your next appointment, please call your pharmacy.

## 2017-04-01 NOTE — Assessment & Plan Note (Signed)
Last SCr 1.14 Sept 2018

## 2017-04-06 ENCOUNTER — Encounter (HOSPITAL_COMMUNITY): Payer: Self-pay

## 2017-04-06 ENCOUNTER — Emergency Department (HOSPITAL_COMMUNITY)
Admission: EM | Admit: 2017-04-06 | Discharge: 2017-04-06 | Disposition: A | Discharge status: Home / Self Care | Payer: Medicare Other | Attending: Emergency Medicine | Admitting: Emergency Medicine

## 2017-04-06 ENCOUNTER — Emergency Department (HOSPITAL_COMMUNITY): Payer: Medicare Other

## 2017-04-06 DIAGNOSIS — Y998 Other external cause status: Secondary | ICD-10-CM | POA: Diagnosis not present

## 2017-04-06 DIAGNOSIS — Y939 Activity, unspecified: Secondary | ICD-10-CM | POA: Diagnosis not present

## 2017-04-06 DIAGNOSIS — S60222A Contusion of left hand, initial encounter: Secondary | ICD-10-CM | POA: Insufficient documentation

## 2017-04-06 DIAGNOSIS — R42 Dizziness and giddiness: Secondary | ICD-10-CM | POA: Insufficient documentation

## 2017-04-06 DIAGNOSIS — S0083XA Contusion of other part of head, initial encounter: Secondary | ICD-10-CM | POA: Insufficient documentation

## 2017-04-06 DIAGNOSIS — R51 Headache: Secondary | ICD-10-CM | POA: Diagnosis not present

## 2017-04-06 DIAGNOSIS — M25562 Pain in left knee: Secondary | ICD-10-CM | POA: Insufficient documentation

## 2017-04-06 DIAGNOSIS — Y929 Unspecified place or not applicable: Secondary | ICD-10-CM | POA: Insufficient documentation

## 2017-04-06 DIAGNOSIS — M7989 Other specified soft tissue disorders: Secondary | ICD-10-CM | POA: Diagnosis not present

## 2017-04-06 DIAGNOSIS — W19XXXA Unspecified fall, initial encounter: Secondary | ICD-10-CM | POA: Insufficient documentation

## 2017-04-06 LAB — CBC
HCT: 44.1 % (ref 36.0–46.0)
Hemoglobin: 14.6 g/dL (ref 12.0–15.0)
MCH: 31.7 pg (ref 26.0–34.0)
MCHC: 33.1 g/dL (ref 30.0–36.0)
MCV: 95.7 fL (ref 78.0–100.0)
Platelets: 257 10*3/uL (ref 150–400)
RBC: 4.61 MIL/uL (ref 3.87–5.11)
RDW: 15.4 % (ref 11.5–15.5)
WBC: 8 10*3/uL (ref 4.0–10.5)

## 2017-04-06 LAB — BASIC METABOLIC PANEL
Anion gap: 11 (ref 5–15)
BUN: 14 mg/dL (ref 6–20)
CO2: 30 mmol/L (ref 22–32)
Calcium: 9.1 mg/dL (ref 8.9–10.3)
Chloride: 99 mmol/L — ABNORMAL LOW (ref 101–111)
Creatinine, Ser: 1.01 mg/dL — ABNORMAL HIGH (ref 0.44–1.00)
GFR calc Af Amer: 60 mL/min — ABNORMAL LOW (ref >60)
GFR calc non Af Amer: 52 mL/min — ABNORMAL LOW (ref >60)
Glucose, Bld: 139 mg/dL — ABNORMAL HIGH (ref 65–99)
Potassium: 2.7 mmol/L — CL (ref 3.5–5.1)
Sodium: 140 mmol/L (ref 135–145)

## 2017-04-06 MED ORDER — POTASSIUM CHLORIDE CRYS ER 20 MEQ PO TBCR
40.0000 meq | EXTENDED_RELEASE_TABLET | Freq: Once | ORAL | Status: AC
  Administered 2017-04-06: 40 meq via ORAL
  Filled 2017-04-06: qty 2

## 2017-04-06 NOTE — ED Notes (Signed)
Dr. Billy Fischer notified on pt.'s low Pottasium level.

## 2017-04-06 NOTE — ED Triage Notes (Signed)
Pt states she had some dizziness this morning causing a fall. She has bruising and swelling noted to the left hand, a hematoma to her left forehead, and pain to the left knee. Pt AOX4. Sts she does take plavix.

## 2017-04-07 ENCOUNTER — Telehealth: Payer: Self-pay | Admitting: Family Medicine

## 2017-04-07 NOTE — Telephone Encounter (Signed)
The head CT was normal with no fractures and no bleeding inside the skull. The hand Xray was negative for fractures.

## 2017-04-07 NOTE — Telephone Encounter (Signed)
Pt states she fell yesterday morning. Went to the ED yesterday afternoon. Has a bruise on her arm, left hand is swollen, knot on her head. Pt states hospital did xray, CT scan, . pt got tired of waiting and left without talking or seeing the doctor. Pt would like Dr Sarajane Jews to look at her results and advise her what she should do.  Pt states she is not going to go back to the hospital.  Pt states her hand is black and blue and painful.

## 2017-04-08 MED ORDER — HYDROCODONE-ACETAMINOPHEN 10-325 MG PO TABS: 1.0000 | ORAL_TABLET | Freq: Four times a day (QID) | ORAL | 0 refills | Status: DC | PRN

## 2017-04-08 NOTE — Telephone Encounter (Signed)
She fell a few days ago and is in a lot of pain. She went to the ER and no fractures were found. We will refill her Norco a few days early.

## 2017-04-08 NOTE — Telephone Encounter (Signed)
Done

## 2017-04-08 NOTE — Telephone Encounter (Signed)
Script is ready for pick up here at front office and I spoke with pt, also letter was given.

## 2017-04-08 NOTE — Telephone Encounter (Signed)
I spoke with pt and went over results. Pt also requesting a refill on Hydrocodone.

## 2017-04-09 ENCOUNTER — Encounter (HOSPITAL_COMMUNITY): Payer: Medicare Other

## 2017-04-09 ENCOUNTER — Ambulatory Visit: Payer: Medicare Other | Admitting: Family

## 2017-04-10 ENCOUNTER — Encounter (HOSPITAL_COMMUNITY): Payer: Medicare Other

## 2017-04-11 DIAGNOSIS — J449 Chronic obstructive pulmonary disease, unspecified: Secondary | ICD-10-CM | POA: Diagnosis not present

## 2017-04-14 ENCOUNTER — Encounter (HOSPITAL_COMMUNITY): Payer: Medicare Other

## 2017-04-15 ENCOUNTER — Ambulatory Visit: Payer: Medicare Other | Admitting: Family

## 2017-04-15 ENCOUNTER — Encounter (HOSPITAL_COMMUNITY): Payer: Medicare Other

## 2017-04-24 ENCOUNTER — Ambulatory Visit: Payer: Medicare Other | Admitting: Family

## 2017-04-27 ENCOUNTER — Telehealth: Payer: Self-pay | Admitting: Cardiology

## 2017-04-27 ENCOUNTER — Encounter: Payer: Medicare Other | Admitting: *Deleted

## 2017-04-27 DIAGNOSIS — J449 Chronic obstructive pulmonary disease, unspecified: Secondary | ICD-10-CM | POA: Diagnosis not present

## 2017-04-27 NOTE — Telephone Encounter (Signed)
LMOVM reminding pt to send remote transmission.   

## 2017-04-30 ENCOUNTER — Encounter: Payer: Self-pay | Admitting: Cardiology

## 2017-05-04 NOTE — Progress Notes (Signed)
No ICM remote transmission received for 04/27/2017 and next ICM transmission scheduled for 05/18/2017.

## 2017-05-11 ENCOUNTER — Ambulatory Visit (INDEPENDENT_AMBULATORY_CARE_PROVIDER_SITE_OTHER): Payer: Medicare Other | Admitting: *Deleted

## 2017-05-11 DIAGNOSIS — I255 Ischemic cardiomyopathy: Secondary | ICD-10-CM | POA: Diagnosis not present

## 2017-05-11 NOTE — Progress Notes (Signed)
Remote ICD transmission.   

## 2017-05-12 DIAGNOSIS — J449 Chronic obstructive pulmonary disease, unspecified: Secondary | ICD-10-CM | POA: Diagnosis not present

## 2017-05-14 LAB — CUP PACEART REMOTE DEVICE CHECK
Brady Statistic AP VP Percent: 4.06 %
Brady Statistic AP VS Percent: 0.05 %
Brady Statistic AS VS Percent: 0.79 %
Brady Statistic RV Percent Paced: 97.38 %
Date Time Interrogation Session: 20181124042956
HIGH POWER IMPEDANCE MEASURED VALUE: 55 Ohm
HighPow Impedance: 66 Ohm
Implantable Lead Implant Date: 20091202
Implantable Lead Location: 753858
Implantable Lead Model: 4194
Implantable Lead Serial Number: 308680
Implantable Lead Serial Number: 635533
Lead Channel Impedance Value: 361 Ohm
Lead Channel Impedance Value: 722 Ohm
Lead Channel Impedance Value: 722 Ohm
Lead Channel Pacing Threshold Amplitude: 1.75 V
Lead Channel Sensing Intrinsic Amplitude: 3.75 mV
Lead Channel Setting Pacing Amplitude: 2 V
Lead Channel Setting Pacing Amplitude: 2.5 V
Lead Channel Setting Pacing Pulse Width: 0.4 ms
Lead Channel Setting Pacing Pulse Width: 1 ms
MDC IDC LEAD IMPLANT DT: 20091202
MDC IDC LEAD IMPLANT DT: 20091202
MDC IDC LEAD LOCATION: 753859
MDC IDC LEAD LOCATION: 753860
MDC IDC MSMT BATTERY REMAINING LONGEVITY: 80 mo
MDC IDC MSMT BATTERY VOLTAGE: 3.07 V
MDC IDC MSMT LEADCHNL LV IMPEDANCE VALUE: 475 Ohm
MDC IDC MSMT LEADCHNL LV IMPEDANCE VALUE: 665 Ohm
MDC IDC MSMT LEADCHNL LV PACING THRESHOLD PULSEWIDTH: 1 ms
MDC IDC MSMT LEADCHNL RA IMPEDANCE VALUE: 456 Ohm
MDC IDC MSMT LEADCHNL RA PACING THRESHOLD AMPLITUDE: 0.5 V
MDC IDC MSMT LEADCHNL RA PACING THRESHOLD PULSEWIDTH: 0.4 ms
MDC IDC MSMT LEADCHNL RA SENSING INTR AMPL: 3.75 mV
MDC IDC MSMT LEADCHNL RV PACING THRESHOLD AMPLITUDE: 0.625 V
MDC IDC MSMT LEADCHNL RV PACING THRESHOLD PULSEWIDTH: 0.4 ms
MDC IDC MSMT LEADCHNL RV SENSING INTR AMPL: 29.75 mV
MDC IDC MSMT LEADCHNL RV SENSING INTR AMPL: 29.75 mV
MDC IDC PG IMPLANT DT: 20180919
MDC IDC SET LEADCHNL RA PACING AMPLITUDE: 1.5 V
MDC IDC SET LEADCHNL RV SENSING SENSITIVITY: 0.3 mV
MDC IDC STAT BRADY AS VP PERCENT: 95.11 %
MDC IDC STAT BRADY RA PERCENT PACED: 4.06 %

## 2017-05-15 ENCOUNTER — Encounter: Payer: Self-pay | Admitting: Cardiology

## 2017-05-18 ENCOUNTER — Ambulatory Visit (INDEPENDENT_AMBULATORY_CARE_PROVIDER_SITE_OTHER): Payer: Medicare Other

## 2017-05-18 DIAGNOSIS — Z95810 Presence of automatic (implantable) cardiac defibrillator: Secondary | ICD-10-CM

## 2017-05-18 DIAGNOSIS — I5042 Chronic combined systolic (congestive) and diastolic (congestive) heart failure: Secondary | ICD-10-CM

## 2017-05-18 NOTE — Progress Notes (Signed)
EPIC Encounter for ICM Monitoring  Patient Name: Sydney Phillips is a 81 y.o. female Date: 05/18/2017 Primary Care Physican: Laurey Morale, MD Primary Cardiologist:Croitoru Electrophysiologist: Croitoru DryWeight: unknown Bi-V Pacing: 96.9%          Attempted call to patient and unable to reach.  Left message to return call.  Transmission reviewed.    Thoracic impedance abnormal suggesting fluid accumulation but trending back to baseline.  Prescribed dosage: Furosemide 80 mg 1 tablet every AM and 0.5 tablet (40 mg total) every PM. Potassium 20 mEq 1 tablet twice a day.  Labs: 04/06/2017 Creatinine 1.01, BUN 14, Potassium 2.7, Sodium 140, EGFR 52-60 02/25/2017 Creatinine 1.14, BUN 18, Potassium 4.7, Sodium 141, EGFR 46-53       02/17/2017 Creatinine 1.15, BUN 9,   Potassium 2.8, Sodium 140, EGFR 44-51 01/08/2017 Creatinine 1.28, BUN 20, Potassium 4.9, Sodium 143, EGFR 40-46 01/02/2017 Creatinine 1.61, BUN 48, Potassium 5.5, Sodium 138, EGFR 30-35  12/20/2016 Creatinine 1.42, BUN 33, Potassium 5.0, Sodium 139, EGFR 34-40  12/19/2016 Creatinine 1.06, BUN 24, Potassium 4.7, Sodium 139, EGFR 49-57  12/18/2016 Creatinine 1.32, BUN 20, Potassium 3.4, Sodium 138, EGFR 38-44  12/17/2016 Creatinine 1.09, BUN 9, Potassium 2.4, Sodium 141, EGFR 47-55  09/25/2016 Creatinine 1.09, BUN 34, Potassium 3.9, Sodium 143  09/04/2016 Creatinine 0.98, BUN 13,Potassium 3.3, Sodium 141 08/08/2016 Creatinine 1.05, BUN 11, Potassium 4.1Sodium 144, BNP 540.2 07/09/2016 Creatinine 1.19, BUN 19, Potassium 4.4, Sodium 140 06/30/2016 Creatinine 1.15, BUN 27, Potassium 4.6, Sodium 139, EGFR 44-51 01/14/2018Creatinine 1.12, BUN 26, Potassium 4.3, Sodium 140, EGFR 46-53  01/13/2018Creatinine 1.01, BUN 26, Potassium 3.7, Sodium 141, EGFR 52->60  06/27/2016 Creatinine 1.06, BUN 14, Potassium 3.7, Sodium 140, EGFR 49-57   Recommendations: NONE - Unable to reach.  Follow-up plan: ICM clinic phone  appointment on 06/04/2017 to recheck fluid levels.  Office appointment scheduled 07/09/2017 with Dr. Sallyanne Kuster.  Copy of ICM check sent to Dr. Sallyanne Kuster.   3 month ICM trend: 05/18/2017    1 Year ICM trend:       [name], RN 05/18/2017 10:40 AM

## 2017-05-19 NOTE — Progress Notes (Signed)
Overall less volatile than before MCr

## 2017-05-22 ENCOUNTER — Other Ambulatory Visit: Payer: Self-pay | Admitting: Cardiovascular Disease

## 2017-05-27 ENCOUNTER — Encounter: Payer: Self-pay | Admitting: Family Medicine

## 2017-05-27 ENCOUNTER — Ambulatory Visit (INDEPENDENT_AMBULATORY_CARE_PROVIDER_SITE_OTHER): Payer: Medicare Other | Admitting: Family Medicine

## 2017-05-27 VITALS — BP 140/60 | Temp 97.9°F | Wt 185.0 lb

## 2017-05-27 DIAGNOSIS — J449 Chronic obstructive pulmonary disease, unspecified: Secondary | ICD-10-CM | POA: Diagnosis not present

## 2017-05-27 DIAGNOSIS — H6021 Malignant otitis externa, right ear: Secondary | ICD-10-CM

## 2017-05-27 MED ORDER — CIPROFLOXACIN-DEXAMETHASONE 0.3-0.1 % OT SUSP: 4.0000 [drp] | Freq: Two times a day (BID) | OTIC | 0 refills | Status: DC

## 2017-05-27 NOTE — Progress Notes (Signed)
   Subjective:    Patient ID: Sydney Phillips, female    DOB: Oct 26, 1935, 81 y.o.   MRN: [id number]  HPI Here for a recurrence of right ear pain. She was here in October with an otitis externa and she was treated with Cortisporin drops. She only used these for 2 days because she had an allergic reaction, but the pain went away. Now for 3 days the pain has returned and her hearing is muffled on the right side.    Review of Systems  Constitutional: Negative.   HENT: Positive for ear pain and hearing loss. Negative for congestion, postnasal drip, sinus pressure, sinus pain and sore throat.   Eyes: Negative.   Respiratory: Negative.        Objective:   Physical Exam  Constitutional: She appears well-developed and well-nourished.  HENT:  Left Ear: External ear normal.  Nose: Nose normal.  Mouth/Throat: Oropharynx is clear and moist.  Right ear canal is red and swollen, no cerumen is present   Eyes: Conjunctivae are normal.  Neck: No thyromegaly present.  Pulmonary/Chest: Effort normal. No respiratory distress. She has no wheezes. She has no rales.  Lymphadenopathy:    She has no cervical adenopathy.          Assessment & Plan:  Right otitis externa. Treat with Ciprodex drops.  Alysia Penna, MD

## 2017-06-01 ENCOUNTER — Ambulatory Visit: Payer: Medicare Other | Admitting: Family

## 2017-06-01 ENCOUNTER — Inpatient Hospital Stay (HOSPITAL_COMMUNITY): Admission: RE | Admit: 2017-06-01 | Payer: Medicare Other | Source: Ambulatory Visit

## 2017-06-01 ENCOUNTER — Encounter (HOSPITAL_COMMUNITY): Payer: Medicare Other

## 2017-06-04 ENCOUNTER — Ambulatory Visit (INDEPENDENT_AMBULATORY_CARE_PROVIDER_SITE_OTHER): Payer: Self-pay

## 2017-06-04 DIAGNOSIS — I5042 Chronic combined systolic (congestive) and diastolic (congestive) heart failure: Secondary | ICD-10-CM

## 2017-06-04 DIAGNOSIS — Z95810 Presence of automatic (implantable) cardiac defibrillator: Secondary | ICD-10-CM

## 2017-06-05 NOTE — Progress Notes (Signed)
Thank you MCr 

## 2017-06-05 NOTE — Progress Notes (Signed)
EPIC Encounter for ICM Monitoring  Patient Name: Sydney Phillips is a 81 y.o. female Date: 06/05/2017 Primary Care Physican: Laurey Morale, MD Primary Cardiologist:Croitoru Electrophysiologist: Croitoru DryWeight: unknown Bi-V Pacing: 97.3%      Transmission received.   Thoracic impedance returned to normal since last ICM remote transmission 05/18/2017.  Prescribed dosage: Furosemide 80 mg 1 tablet every AM and 0.5 tablet (40 mg total) every PM. Potassium 20 mEq 1 tablet twice a day.  Labs: 04/06/2017 Creatinine 1.01, BUN 14, Potassium 2.7, Sodium 140, EGFR 52-60 02/25/2017 Creatinine 1.14, BUN 18, Potassium 4.7, Sodium 141, EGFR 46-53       02/17/2017 Creatinine 1.15, BUN 9,   Potassium 2.8, Sodium 140, EGFR 44-51 01/08/2017 Creatinine 1.28, BUN 20, Potassium 4.9, Sodium 143, EGFR 40-46 01/02/2017 Creatinine 1.61, BUN 48, Potassium 5.5, Sodium 138, EGFR 30-35  12/20/2016 Creatinine 1.42, BUN 33, Potassium 5.0, Sodium 139, EGFR 34-40  12/19/2016 Creatinine 1.06, BUN 24, Potassium 4.7, Sodium 139, EGFR 49-57  12/18/2016 Creatinine 1.32, BUN 20, Potassium 3.4, Sodium 138, EGFR 38-44  12/17/2016 Creatinine 1.09, BUN 9, Potassium 2.4, Sodium 141, EGFR 47-55  09/25/2016 Creatinine 1.09, BUN 34, Potassium 3.9, Sodium 143  09/04/2016 Creatinine 0.98, BUN 13,Potassium 3.3, Sodium 141 08/08/2016 Creatinine 1.05, BUN 11, Potassium 4.1Sodium 144, BNP 540.2 07/09/2016 Creatinine 1.19, BUN 19, Potassium 4.4, Sodium 140 06/30/2016 Creatinine 1.15, BUN 27, Potassium 4.6, Sodium 139, EGFR 44-51 01/14/2018Creatinine 1.12, BUN 26, Potassium 4.3, Sodium 140, EGFR 46-53  01/13/2018Creatinine 1.01, BUN 26, Potassium 3.7, Sodium 141, EGFR 52->60  06/27/2016 Creatinine 1.06, BUN 14, Potassium 3.7, Sodium 140, EGFR 49-57   Recommendations: None.  Follow-up plan: ICM clinic phone appointment on 08/10/2017.  Office appointment scheduled 07/09/2017 with Dr. Sallyanne Kuster.  Copy of ICM check  sent to Dr. Sallyanne Kuster.   3 month ICM trend: 06/04/2017    1 Year ICM trend:       [name], RN 06/05/2017 12:07 PM

## 2017-06-11 DIAGNOSIS — J449 Chronic obstructive pulmonary disease, unspecified: Secondary | ICD-10-CM | POA: Diagnosis not present

## 2017-06-23 ENCOUNTER — Telehealth: Payer: Self-pay | Admitting: Family Medicine

## 2017-06-23 NOTE — Telephone Encounter (Signed)
She should be taking 400 MG twice daily. Called pt and left a detailed VM. Advised to call back if needed

## 2017-06-23 NOTE — Telephone Encounter (Signed)
CRM for notification. See Telephone encounter for:   06/23/17.  Relation to pt: self  Call back number: [phone]  Reason for call:    Patient would like to know how many MG should she be taking of Magnesium Oxide, patient states muscles and legs are as "hard as concrete"  please advise

## 2017-06-27 DIAGNOSIS — J449 Chronic obstructive pulmonary disease, unspecified: Secondary | ICD-10-CM | POA: Diagnosis not present

## 2017-07-02 ENCOUNTER — Ambulatory Visit (INDEPENDENT_AMBULATORY_CARE_PROVIDER_SITE_OTHER): Payer: Medicare Other | Admitting: Family Medicine

## 2017-07-02 ENCOUNTER — Encounter: Payer: Self-pay | Admitting: Family Medicine

## 2017-07-02 VITALS — BP 138/74 | HR 95 | Temp 98.1°F | Ht 65.0 in | Wt 185.2 lb

## 2017-07-02 DIAGNOSIS — I272 Pulmonary hypertension, unspecified: Secondary | ICD-10-CM | POA: Diagnosis not present

## 2017-07-02 DIAGNOSIS — E7800 Pure hypercholesterolemia, unspecified: Secondary | ICD-10-CM | POA: Diagnosis not present

## 2017-07-02 DIAGNOSIS — J439 Emphysema, unspecified: Secondary | ICD-10-CM

## 2017-07-02 DIAGNOSIS — G4733 Obstructive sleep apnea (adult) (pediatric): Secondary | ICD-10-CM

## 2017-07-02 DIAGNOSIS — I739 Peripheral vascular disease, unspecified: Secondary | ICD-10-CM

## 2017-07-02 DIAGNOSIS — Z0000 Encounter for general adult medical examination without abnormal findings: Secondary | ICD-10-CM

## 2017-07-02 DIAGNOSIS — N183 Chronic kidney disease, stage 3 unspecified: Secondary | ICD-10-CM

## 2017-07-02 DIAGNOSIS — I214 Non-ST elevation (NSTEMI) myocardial infarction: Secondary | ICD-10-CM

## 2017-07-02 DIAGNOSIS — J449 Chronic obstructive pulmonary disease, unspecified: Secondary | ICD-10-CM

## 2017-07-02 DIAGNOSIS — K219 Gastro-esophageal reflux disease without esophagitis: Secondary | ICD-10-CM | POA: Diagnosis not present

## 2017-07-02 DIAGNOSIS — I10 Essential (primary) hypertension: Secondary | ICD-10-CM | POA: Diagnosis not present

## 2017-07-02 DIAGNOSIS — G4700 Insomnia, unspecified: Secondary | ICD-10-CM | POA: Diagnosis not present

## 2017-07-02 DIAGNOSIS — M25559 Pain in unspecified hip: Secondary | ICD-10-CM

## 2017-07-02 DIAGNOSIS — E118 Type 2 diabetes mellitus with unspecified complications: Secondary | ICD-10-CM

## 2017-07-02 LAB — CBC WITH DIFFERENTIAL/PLATELET
BASOS ABS: 0.1 10*3/uL (ref 0.0–0.1)
Basophils Relative: 0.7 % (ref 0.0–3.0)
EOS ABS: 0.5 10*3/uL (ref 0.0–0.7)
Eosinophils Relative: 5.1 % — ABNORMAL HIGH (ref 0.0–5.0)
HEMATOCRIT: 43.7 % (ref 36.0–46.0)
Hemoglobin: 14.3 g/dL (ref 12.0–15.0)
LYMPHS PCT: 21.4 % (ref 12.0–46.0)
Lymphs Abs: 2 10*3/uL (ref 0.7–4.0)
MCHC: 32.7 g/dL (ref 30.0–36.0)
MCV: 95.6 fl (ref 78.0–100.0)
Monocytes Absolute: 0.8 10*3/uL (ref 0.1–1.0)
Monocytes Relative: 8.7 % (ref 3.0–12.0)
NEUTROS ABS: 5.9 10*3/uL (ref 1.4–7.7)
Neutrophils Relative %: 64.1 % (ref 43.0–77.0)
PLATELETS: 301 10*3/uL (ref 150.0–400.0)
RBC: 4.57 Mil/uL (ref 3.87–5.11)
RDW: 17.1 % — ABNORMAL HIGH (ref 11.5–15.5)
WBC: 9.1 10*3/uL (ref 4.0–10.5)

## 2017-07-02 LAB — LIPID PANEL
CHOL/HDL RATIO: 6
Cholesterol: 183 mg/dL (ref 0–200)
HDL: 31.5 mg/dL — ABNORMAL LOW (ref >39.00)
NonHDL: 151.93
Triglycerides: 296 mg/dL — ABNORMAL HIGH (ref 0.0–149.0)
VLDL: 59.2 mg/dL — AB (ref 0.0–40.0)

## 2017-07-02 LAB — POCT URINALYSIS DIPSTICK
Bilirubin, UA: NEGATIVE
Blood, UA: NEGATIVE
GLUCOSE UA: NEGATIVE
KETONES UA: NEGATIVE
Leukocytes, UA: NEGATIVE
Nitrite, UA: NEGATIVE
PROTEIN UA: NEGATIVE
SPEC GRAV UA: 1.02 (ref 1.010–1.025)
Urobilinogen, UA: 0.2 E.U./dL
pH, UA: 6 (ref 5.0–8.0)

## 2017-07-02 LAB — HEPATIC FUNCTION PANEL
ALK PHOS: 103 U/L (ref 39–117)
ALT: 16 U/L (ref 0–35)
AST: 18 U/L (ref 0–37)
Albumin: 4.4 g/dL (ref 3.5–5.2)
BILIRUBIN TOTAL: 0.5 mg/dL (ref 0.2–1.2)
Bilirubin, Direct: 0.1 mg/dL (ref 0.0–0.3)
Total Protein: 6.7 g/dL (ref 6.0–8.3)

## 2017-07-02 LAB — BASIC METABOLIC PANEL
BUN: 17 mg/dL (ref 6–23)
CO2: 34 mEq/L — ABNORMAL HIGH (ref 19–32)
Calcium: 9.5 mg/dL (ref 8.4–10.5)
Chloride: 100 mEq/L (ref 96–112)
Creatinine, Ser: 1.09 mg/dL (ref 0.40–1.20)
GFR: 51.41 mL/min — AB (ref >60.00)
Glucose, Bld: 103 mg/dL — ABNORMAL HIGH (ref 70–99)
POTASSIUM: 3.2 meq/L — AB (ref 3.5–5.1)
SODIUM: 141 meq/L (ref 135–145)

## 2017-07-02 LAB — LDL CHOLESTEROL, DIRECT: Direct LDL: 115 mg/dL

## 2017-07-02 LAB — TSH: TSH: 3.89 u[IU]/mL (ref 0.35–4.50)

## 2017-07-02 MED ORDER — CLOPIDOGREL BISULFATE 75 MG PO TABS: 75.0000 mg | ORAL_TABLET | Freq: Every day | ORAL | 3 refills | Status: DC

## 2017-07-02 MED ORDER — TRAZODONE HCL 50 MG PO TABS: 50.0000 mg | ORAL_TABLET | Freq: Every day | ORAL | 3 refills | Status: DC

## 2017-07-02 MED ORDER — BISOPROLOL FUMARATE 5 MG PO TABS: 5.0000 mg | ORAL_TABLET | Freq: Every day | ORAL | 3 refills | Status: DC

## 2017-07-02 MED ORDER — ALPRAZOLAM 0.5 MG PO TABS: 0.5000 mg | ORAL_TABLET | Freq: Every evening | ORAL | 5 refills | Status: DC | PRN

## 2017-07-02 MED ORDER — ATORVASTATIN CALCIUM 40 MG PO TABS: 40.0000 mg | ORAL_TABLET | Freq: Every day | ORAL | 3 refills | Status: DC

## 2017-07-02 MED ORDER — FUROSEMIDE 80 MG PO TABS: ORAL_TABLET | ORAL | 3 refills | Status: DC

## 2017-07-02 MED ORDER — HYDROCODONE-ACETAMINOPHEN 10-325 MG PO TABS: 1.0000 | ORAL_TABLET | Freq: Four times a day (QID) | ORAL | 0 refills | Status: DC | PRN

## 2017-07-02 NOTE — Progress Notes (Signed)
   Subjective:    Patient ID: Sydney Phillips, female    DOB: Dec 16, 1935, 82 y.o.   MRN: [id number]  HPI Here to follow up on issues. She feels well in general except she has dizziness off and on. She walks with a rolling walker. She has been using Xanax 1 mg and Trazodone 50 mg eacvh night for sleep, but she thinks the Xanax may be causing her breathing to be short. No trouble during the day. She sees her Cardiologist frequently. Her COPD is stable. She wears oxygen at night.    Review of Systems  Constitutional: Negative.   HENT: Negative.   Eyes: Negative.   Respiratory: Positive for shortness of breath. Negative for cough and wheezing.   Cardiovascular: Negative.   Gastrointestinal: Negative.   Genitourinary: Negative for decreased urine volume, difficulty urinating, dyspareunia, dysuria, enuresis, flank pain, frequency, hematuria, pelvic pain and urgency.  Musculoskeletal: Negative.   Skin: Negative.   Neurological: Positive for dizziness. Negative for tremors, seizures, syncope, facial asymmetry, speech difficulty, weakness, numbness and headaches.  Psychiatric/Behavioral: Negative.        Objective:   Physical Exam  Constitutional: She is oriented to person, place, and time. She appears well-developed and well-nourished. No distress.  HENT:  Head: Normocephalic and atraumatic.  Right Ear: External ear normal.  Left Ear: External ear normal.  Nose: Nose normal.  Mouth/Throat: Oropharynx is clear and moist. No oropharyngeal exudate.  Eyes: Conjunctivae and EOM are normal. Pupils are equal, round, and reactive to light. No scleral icterus.  Neck: Normal range of motion. Neck supple. No JVD present. No thyromegaly present.  Cardiovascular: Normal rate, regular rhythm, normal heart sounds and intact distal pulses. Exam reveals no gallop and no friction rub.  No murmur heard. Pulmonary/Chest: Effort normal and breath sounds normal. No respiratory distress. She has no wheezes.  She has no rales. She exhibits no tenderness.  Abdominal: Soft. Bowel sounds are normal. She exhibits no distension and no mass. There is no tenderness. There is no rebound and no guarding.  Musculoskeletal: Normal range of motion. She exhibits no edema or tenderness.  Lymphadenopathy:    She has no cervical adenopathy.  Neurological: She is alert and oriented to person, place, and time. She has normal reflexes. No cranial nerve deficit. She exhibits normal muscle tone. Coordination normal.  Skin: Skin is warm and dry. No rash noted. No erythema.  Psychiatric: She has a normal mood and affect. Her behavior is normal. Judgment and thought content normal.          Assessment & Plan:  Her CAD and CHF are well controlled. Her COPD is stable. For sleep we will decrease the Xanax to 0.5 mg along with the Trazodone. We will get fasting labs to check her lipids, potassium, glucose, etc. Meds were refilled.  Alysia Penna, MD

## 2017-07-09 ENCOUNTER — Encounter: Payer: Medicare Other | Admitting: Cardiovascular Disease

## 2017-07-12 DIAGNOSIS — J449 Chronic obstructive pulmonary disease, unspecified: Secondary | ICD-10-CM | POA: Diagnosis not present

## 2017-07-14 ENCOUNTER — Telehealth: Payer: Self-pay

## 2017-07-14 NOTE — Telephone Encounter (Signed)
Sent to PCP to review pt's lab results  Thanks   Copied from [place]. Topic: Inquiry >> Jul 14, 2017 11:32 AM Patrice Paradise wrote: Reason for CRM: Patient has called again to get her lab results that was done on 07/02/17. She can be reached @ [phone].

## 2017-07-14 NOTE — Telephone Encounter (Signed)
Copied from [place]. Topic: Quick Communication - Lab Results >> Jul 09, 2017  3:25 PM Synthia Innocent wrote: Requesting lab work results >> Jul 13, 2017 11:16 AM Synthia Innocent wrote: Patient calling again, requesting lab results

## 2017-07-15 NOTE — Telephone Encounter (Signed)
Her labs on 07-02-17 were normal except her potassium is still low and the cholesterol is high. Watch a strict diet. Increase the potassium to 20 mEq taking 2 tabs BID (total of 4 a day). Call in #[phone] with 3 rf. Recheck a BMET in 2 weeks.

## 2017-07-15 NOTE — Telephone Encounter (Signed)
Called patient and left message to return call

## 2017-07-16 MED ORDER — POTASSIUM CHLORIDE CRYS ER 20 MEQ PO TBCR: 40.0000 meq | EXTENDED_RELEASE_TABLET | Freq: Two times a day (BID) | ORAL | 3 refills | Status: DC

## 2017-07-16 NOTE — Telephone Encounter (Signed)
Pt given results per notes of Dr. Sarajane Jews on 07/15/17, patient verbalized understanding, lab appointment made for 07/30/17 at 1500, unable to document in result note due to result note not being routed to [facility].

## 2017-07-16 NOTE — Addendum Note (Signed)
Addended by: Matilde Sprang on: 07/16/2017 12:25 PM   Modules accepted: Orders

## 2017-07-16 NOTE — Telephone Encounter (Signed)
Copied from [place] [id number]. Topic: Quick Communication - Lab Results >> Jul 15, 2017  3:51 PM Dorrene German, RN wrote: Hulen Skains patient to inform them of  lab results. When patient returns call, triage nurse may disclose results. LAB RESULT NOTES ARE IN 07/14/17 TELEPHONE ENCOUNTER. >> Jul 15, 2017  5:25 PM Boyd Kerbs wrote: Please call pt. Back on her lab results.Marland Kitchen

## 2017-07-18 ENCOUNTER — Emergency Department (HOSPITAL_COMMUNITY)
Admission: EM | Admit: 2017-07-18 | Discharge: 2017-07-18 | Disposition: A | Discharge status: Home / Self Care | Payer: Medicare Other | Source: Home / Self Care | Attending: Emergency Medicine | Admitting: Emergency Medicine

## 2017-07-18 ENCOUNTER — Other Ambulatory Visit: Payer: Self-pay

## 2017-07-18 ENCOUNTER — Encounter (HOSPITAL_COMMUNITY): Payer: Self-pay | Admitting: Emergency Medicine

## 2017-07-18 ENCOUNTER — Emergency Department (HOSPITAL_COMMUNITY): Payer: Medicare Other

## 2017-07-18 DIAGNOSIS — I252 Old myocardial infarction: Secondary | ICD-10-CM | POA: Diagnosis not present

## 2017-07-18 DIAGNOSIS — Z79899 Other long term (current) drug therapy: Secondary | ICD-10-CM

## 2017-07-18 DIAGNOSIS — Z880 Allergy status to penicillin: Secondary | ICD-10-CM | POA: Diagnosis not present

## 2017-07-18 DIAGNOSIS — Z882 Allergy status to sulfonamides status: Secondary | ICD-10-CM | POA: Diagnosis not present

## 2017-07-18 DIAGNOSIS — J9621 Acute and chronic respiratory failure with hypoxia: Secondary | ICD-10-CM | POA: Diagnosis not present

## 2017-07-18 DIAGNOSIS — F17210 Nicotine dependence, cigarettes, uncomplicated: Secondary | ICD-10-CM

## 2017-07-18 DIAGNOSIS — Z9114 Patient's other noncompliance with medication regimen: Secondary | ICD-10-CM | POA: Diagnosis not present

## 2017-07-18 DIAGNOSIS — F0390 Unspecified dementia without behavioral disturbance: Secondary | ICD-10-CM | POA: Insufficient documentation

## 2017-07-18 DIAGNOSIS — I110 Hypertensive heart disease with heart failure: Secondary | ICD-10-CM

## 2017-07-18 DIAGNOSIS — E1122 Type 2 diabetes mellitus with diabetic chronic kidney disease: Secondary | ICD-10-CM

## 2017-07-18 DIAGNOSIS — R05 Cough: Secondary | ICD-10-CM | POA: Diagnosis not present

## 2017-07-18 DIAGNOSIS — Z888 Allergy status to other drugs, medicaments and biological substances status: Secondary | ICD-10-CM | POA: Diagnosis not present

## 2017-07-18 DIAGNOSIS — Z881 Allergy status to other antibiotic agents status: Secondary | ICD-10-CM | POA: Diagnosis not present

## 2017-07-18 DIAGNOSIS — I5020 Unspecified systolic (congestive) heart failure: Secondary | ICD-10-CM | POA: Diagnosis not present

## 2017-07-18 DIAGNOSIS — Z7902 Long term (current) use of antithrombotics/antiplatelets: Secondary | ICD-10-CM

## 2017-07-18 DIAGNOSIS — Z7982 Long term (current) use of aspirin: Secondary | ICD-10-CM | POA: Insufficient documentation

## 2017-07-18 DIAGNOSIS — J449 Chronic obstructive pulmonary disease, unspecified: Secondary | ICD-10-CM

## 2017-07-18 DIAGNOSIS — I361 Nonrheumatic tricuspid (valve) insufficiency: Secondary | ICD-10-CM | POA: Diagnosis not present

## 2017-07-18 DIAGNOSIS — Z95810 Presence of automatic (implantable) cardiac defibrillator: Secondary | ICD-10-CM | POA: Diagnosis not present

## 2017-07-18 DIAGNOSIS — Z955 Presence of coronary angioplasty implant and graft: Secondary | ICD-10-CM

## 2017-07-18 DIAGNOSIS — I5043 Acute on chronic combined systolic (congestive) and diastolic (congestive) heart failure: Secondary | ICD-10-CM | POA: Insufficient documentation

## 2017-07-18 DIAGNOSIS — R069 Unspecified abnormalities of breathing: Secondary | ICD-10-CM | POA: Diagnosis not present

## 2017-07-18 DIAGNOSIS — E1151 Type 2 diabetes mellitus with diabetic peripheral angiopathy without gangrene: Secondary | ICD-10-CM | POA: Diagnosis not present

## 2017-07-18 DIAGNOSIS — I340 Nonrheumatic mitral (valve) insufficiency: Secondary | ICD-10-CM | POA: Diagnosis not present

## 2017-07-18 DIAGNOSIS — E785 Hyperlipidemia, unspecified: Secondary | ICD-10-CM | POA: Diagnosis not present

## 2017-07-18 DIAGNOSIS — G4733 Obstructive sleep apnea (adult) (pediatric): Secondary | ICD-10-CM | POA: Diagnosis not present

## 2017-07-18 DIAGNOSIS — I2510 Atherosclerotic heart disease of native coronary artery without angina pectoris: Secondary | ICD-10-CM | POA: Insufficient documentation

## 2017-07-18 DIAGNOSIS — R0602 Shortness of breath: Secondary | ICD-10-CM | POA: Diagnosis not present

## 2017-07-18 DIAGNOSIS — M797 Fibromyalgia: Secondary | ICD-10-CM | POA: Diagnosis not present

## 2017-07-18 DIAGNOSIS — J45909 Unspecified asthma, uncomplicated: Secondary | ICD-10-CM | POA: Insufficient documentation

## 2017-07-18 DIAGNOSIS — R062 Wheezing: Secondary | ICD-10-CM | POA: Diagnosis not present

## 2017-07-18 DIAGNOSIS — N183 Chronic kidney disease, stage 3 (moderate): Secondary | ICD-10-CM | POA: Diagnosis not present

## 2017-07-18 DIAGNOSIS — I130 Hypertensive heart and chronic kidney disease with heart failure and stage 1 through stage 4 chronic kidney disease, or unspecified chronic kidney disease: Secondary | ICD-10-CM | POA: Diagnosis not present

## 2017-07-18 DIAGNOSIS — K219 Gastro-esophageal reflux disease without esophagitis: Secondary | ICD-10-CM | POA: Diagnosis not present

## 2017-07-18 DIAGNOSIS — Z885 Allergy status to narcotic agent status: Secondary | ICD-10-CM | POA: Diagnosis not present

## 2017-07-18 LAB — BASIC METABOLIC PANEL
Anion gap: 11 (ref 5–15)
BUN: 26 mg/dL — AB (ref 6–20)
CALCIUM: 9.3 mg/dL (ref 8.9–10.3)
CO2: 28 mmol/L (ref 22–32)
Chloride: 103 mmol/L (ref 101–111)
Creatinine, Ser: 1.14 mg/dL — ABNORMAL HIGH (ref 0.44–1.00)
GFR calc Af Amer: 52 mL/min — ABNORMAL LOW (ref >60)
GFR, EST NON AFRICAN AMERICAN: 45 mL/min — AB (ref >60)
GLUCOSE: 153 mg/dL — AB (ref 65–99)
Potassium: 3.7 mmol/L (ref 3.5–5.1)
Sodium: 142 mmol/L (ref 135–145)

## 2017-07-18 LAB — CBC WITH DIFFERENTIAL/PLATELET
BASOS PCT: 0 %
Basophils Absolute: 0 10*3/uL (ref 0.0–0.1)
EOS ABS: 0.3 10*3/uL (ref 0.0–0.7)
Eosinophils Relative: 3 %
HEMATOCRIT: 40.8 % (ref 36.0–46.0)
Hemoglobin: 12.9 g/dL (ref 12.0–15.0)
Lymphocytes Relative: 20 %
Lymphs Abs: 2 10*3/uL (ref 0.7–4.0)
MCH: 30.8 pg (ref 26.0–34.0)
MCHC: 31.6 g/dL (ref 30.0–36.0)
MCV: 97.4 fL (ref 78.0–100.0)
MONO ABS: 0.4 10*3/uL (ref 0.1–1.0)
MONOS PCT: 4 %
Neutro Abs: 7.3 10*3/uL (ref 1.7–7.7)
Neutrophils Relative %: 73 %
Platelets: 272 10*3/uL (ref 150–400)
RBC: 4.19 MIL/uL (ref 3.87–5.11)
RDW: 16.6 % — AB (ref 11.5–15.5)
WBC: 10 10*3/uL (ref 4.0–10.5)

## 2017-07-18 LAB — BRAIN NATRIURETIC PEPTIDE: B Natriuretic Peptide: 721.3 pg/mL — ABNORMAL HIGH (ref 0.0–100.0)

## 2017-07-18 LAB — I-STAT TROPONIN, ED: TROPONIN I, POC: 0.03 ng/mL (ref 0.00–0.08)

## 2017-07-18 MED ORDER — FUROSEMIDE 10 MG/ML IJ SOLN
80.0000 mg | Freq: Once | INTRAMUSCULAR | Status: AC
  Administered 2017-07-18: 80 mg via INTRAVENOUS
  Filled 2017-07-18: qty 8

## 2017-07-18 NOTE — ED Notes (Signed)
ED Provider at bedside. 

## 2017-07-18 NOTE — ED Notes (Signed)
Walked patient with pulse oxy patient did well patient o2 level dropped down to 89 and heart rate stayed at 93 patient is back in bed resting

## 2017-07-18 NOTE — ED Notes (Signed)
Placed pt on [place] at 4Lpm.

## 2017-07-18 NOTE — Progress Notes (Signed)
O2 humidity provided per RT. Pt c/o nose and mouth is dry. RN at bedside.

## 2017-07-18 NOTE — Discharge Instructions (Signed)
Double your dose of furosemide (Lasix) for the next three days. Work with your doctors to identify your ideal weight, and when you should increase your furosemide (Lasix) dose.

## 2017-07-18 NOTE — ED Notes (Signed)
Patient is resting with call bell in

## 2017-07-18 NOTE — ED Notes (Signed)
Took patient saline lot out patient is resting

## 2017-07-18 NOTE — ED Provider Notes (Signed)
[facility] EMERGENCY DEPARTMENT Provider Note   CSN: [id number] Arrival date & time: 07/18/17  0335     History   Chief Complaint Chief Complaint  Patient presents with  . Shortness of Breath    HPI Sydney Phillips is a 82 y.o. female.  The history is provided by the patient.  She has a history of asthma, combined systolic and diastolic heart failure, chronic kidney disease stage III, coronary artery disease, diabetes, hypertension, hyperlipidemia, peripheral arterial disease, COPD, GERD and comes in with progressive dyspnea over the last 2 weeks.  She denies any cough or fever or chills.  She denies any chest pain.  Weight before symptoms started was 179, is now 184.  At home, she did notice some partial relief with nebulizer.  Tonight, she was awakened by difficulty breathing and called for an ambulance.  She used her home nebulizer and received 2 nebulizer treatments in route with some improvement.  She refused methylprednisolone because she states she has had a reaction to it before.  At this point, she is dyspneic with walking about 20 feet.  Past Medical History:  Diagnosis Date  . Anxiety   . Arthritis   . Asthma   . Automatic implantable cardioverter-defibrillator in situ   . Bronchitis   . CAD (coronary artery disease)    a. s/p DES to LCx/RCA 05/2016, ostial LAD disease.  . Cardiomyopathy ([place])   . Chronic back pain   . Chronic combined systolic and diastolic CHF (congestive heart failure) ([place])   . Chronic constipation   . Chronic pain   . CKD (chronic kidney disease), stage III ([place])   . Colon polyps 2003.  2015.   HP polyps 2003.  adnomas 2015.  required referal to [facility] for colonoscopic resection of flat polyps.   Marland Kitchen COPD (chronic obstructive pulmonary disease) ([place])   . Dementia   . Depression   . Depression with anxiety    takes Cymbalta daily  . Diabetes mellitus ([place])   . Dyspnea   . Early cataracts, bilateral   . Fibromyalgia   .  GERD (gastroesophageal reflux disease)    was on meds but was taken off;now watches what she eats  . Hemorrhoids   . History of kidney stones   . Hx of colonic polyps   . Hyperlipemia    takes Crestor daily  . Hypertension    takes Amlodipine and Metoprolol daily  . Insomnia   . LBBB (left bundle branch block)    Stress test 09/03/2010, EF 55  . Myocardial infarction ([place])   . PAD (peripheral artery disease) (HCC)    Carotid, subclavian, and lower extremity beds, currently not symptomatic  . Presence of combination internal cardiac defibrillator (ICD) and pacemaker   . Presence of permanent cardiac pacemaker   . Pulmonary hypertension ([place])   . S/P angioplasty with stent, lt. subclavian 07/31/11 08/01/2011  . Subclavian arterial stenosis, lt, with PTA/STENT 07/31/11 08/01/2011  . Syncope 07/28/2011   EF - 50-55, moderate concentric hypertrophy in left ventricle  . Urinary incontinence   . Vertigo    takes Meclizine prn    Patient Active Problem List   Diagnosis Date Noted  . Poor dentition 04/01/2017  . Tobacco abuse 12/17/2016  . Elevated troponin 06/26/2016  . 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 Chronic respiratory failure with hypoxia ([place])   . Acute encephalopathy 05/30/2016  . CKD (chronic kidney disease), stage III ([place]) 05/30/2016  . Elevated lactic  acid level 05/30/2016  . Leukocytosis   . Stenosis of left subclavian artery ([place]) 05/19/2016  . Fall at home   . Somnolence   . OSA and COPD overlap syndrome ([place])   . Cardiomyopathy, ischemic   . Ischemic mitral valve regurgitation   . Pulmonary hypertension ([place])   . Controlled diabetes mellitus type 2 with complications ([place])   . Encephalopathy   . CAD S/P percutaneous coronary angioplasty   . Pleural effusion   . Altered mental state 02/26/2016  . Skin lesion of back/upper right shoulder 02/26/2016  . Smoking 11/07/2015  . Arthralgia 10/12/2015  . Left carotid stenosis 11/08/2014  . NSTEMI  (non-ST elevated myocardial infarction) ([place]) 01/20/2014  . Melena 01/20/2014  . Microcytic anemia 01/19/2014  . PAD (peripheral artery disease) ([place]) 10/12/2013  . S/P angioplasty with stent, lt. subclavian 07/31/11 08/01/2011  . Biventricular implantable cardioverter-defibrillator in situ 07/28/2011  . PVD, known severe, (previuosly asymptomatic) LSCA disease, now with "high grade" RICA disease. 07/28/2011  . Vertigo 07/27/2011  . Hypokalemia 07/27/2011  . SPINAL STENOSIS 01/28/2010  . CONSTIPATION 01/14/2010  . Insomnia 01/23/2009  . Coronary atherosclerosis 06/21/2008  . HIP PAIN, BILATERAL 06/21/2008  . Myalgia and myositis 11/26/2007  . Hypercholesterolemia 07/28/2007  . WEIGHT GAIN 06/04/2007  . COPD with emphysema ([place]) 03/02/2007  . Depression with anxiety 02/25/2007  . Essential hypertension 02/25/2007  . GERD 02/25/2007  . COLONIC POLYPS, HX OF 02/25/2007    Past Surgical History:  Procedure Laterality Date  . ABDOMINAL AORTAGRAM N/A 08/12/2013   Procedure: ABDOMINAL Maxcine Ham;  Surgeon: Elam Dutch, MD;  Location: [facility] CATH LAB;  Service: Cardiovascular;  Laterality: N/A;  . ABDOMINAL HYSTERECTOMY    . APPENDECTOMY    . BACK SURGERY  2012  . BIV ICD GENERTAOR CHANGE OUT Left 02/20/2012   Procedure: BIV ICD GENERTAOR CHANGE OUT;  Surgeon: [name], MD;  Location: [facility] CATH LAB;  Service: Cardiovascular;  Laterality: Left;  . CARDIAC CATHETERIZATION  12/01/2007   By Dr. Melvern Banker, left heart cath,   . CARDIAC CATHETERIZATION N/A 05/20/2016   Procedure: Right/Left Heart Cath and Coronary Angiography;  Surgeon: Sherren Mocha, MD;  Location: [facility] CV LAB;  Service: Cardiovascular;  Laterality: N/A;  . CARDIAC CATHETERIZATION N/A 05/20/2016   Procedure: Coronary Stent Intervention;  Surgeon: Sherren Mocha, MD;  Location: [place] CV LAB;  Service: Cardiovascular;  Laterality: N/A;  . CARDIAC DEFIBRILLATOR PLACEMENT  05/2008   By Dr Blanch Media, [employer] CANNOT HAVE  MRI's  . CAROTID ANGIOGRAM N/A 07/31/2011   Procedure: CAROTID ANGIOGRAM;  Surgeon: Lorretta Harp, MD;  Location[facility] CATH LAB;  Service: Cardiovascular;  Laterality: N/A;  carotid angiogram and possible Lt SCA PTA  . COLONOSCOPY W/ POLYPECTOMY  12/2013  . CORONARY ANGIOPLASTY    . ENDARTERECTOMY Left 11/08/2014   Procedure: LEFT CAROTID ENDARTERECTOMY WITH HEMASHIELD PATCH ANGIOPLASTY;  Surgeon: Elam Dutch, MD;  Location: [facility];  Service: Vascular;  Laterality: Left;  . ESOPHAGOGASTRODUODENOSCOPY N/A 01/20/2014   Procedure: ESOPHAGOGASTRODUODENOSCOPY (EGD);  Surgeon: Jerene Bears, MD;  Location: [facility] ENDOSCOPY;  Service: Endoscopy;  Laterality: N/A;  . FEMORAL-POPLITEAL BYPASS GRAFT Right 10/12/2013   Procedure:   Femoral-Peroneal trunk  bypass with nonreversed greater saphenous vein graft;  Surgeon: Elam Dutch, MD;  Location: [place];  Service: Vascular;  Laterality: Right;  . GIVENS CAPSULE STUDY N/A 01/20/2014   Procedure: GIVENS CAPSULE STUDY;  Surgeon: Jerene Bears, MD;  Location: [place];  Service: Gastroenterology;  Laterality:  N/A;  . ICD GENERATOR CHANGEOUT N/A 03/04/2017   Procedure: ICD Generator Changeout;  Surgeon: [name], MD;  Location: [place] CV LAB;  Service: Cardiovascular;  Laterality: N/A;  . INSERT / REPLACE / REMOVE PACEMAKER    . INTRAOPERATIVE ARTERIOGRAM Right 10/12/2013   Procedure: INTRA OPERATIVE ARTERIOGRAM;  Surgeon: Elam Dutch, MD;  Location: [place];  Service: Vascular;  Laterality: Right;  . ORIF ELBOW FRACTURE  08/16/2011   Procedure: OPEN REDUCTION INTERNAL FIXATION (ORIF) ELBOW/OLECRANON FRACTURE;  Surgeon: Schuyler Amor, MD;  Location: [facility] [place];  Service: Orthopedics;  Laterality: Left;  . RENAL ANGIOGRAM N/A 08/12/2013   Procedure: RENAL ANGIOGRAM;  Surgeon: Elam Dutch, MD;  Location[facility] C[facility] LAB;  Service: Cardiovascular;  Laterality: N/A;  . SUBCLAVIAN STENT PLACEMENT Left 07/31/2011   7x18 [place], balloon, with reduction of  90% ostial left subclavian artery stenosis to 0% with residual excellent flow  . TONSILLECTOMY    . TUBAL LIGATION      OB History    No data available       Home Medications    Prior to Admission medications   Medication Sig Start Date End Date Taking? Authorizing Provider  ACCU-CHEK FASTCLIX LANCETS MISC USE   TO CHECK GLUCOSE ONCE DAILY 02/18/17   Laurey Morale, MD  ACCU-CHEK SMARTVIEW test strip CHECK BLOOD SUGAR ONCE DAILY 02/18/17   Laurey Morale, MD  acetaminophen (TYLENOL) 325 MG tablet Take 325 mg by mouth every 6 (six) hours as needed for mild pain.    [provider]  albuterol (PROVENTIL) (2.5 MG/3ML) 0.083% nebulizer solution Take 3 mLs (2.5 mg total) by nebulization every 4 (four) hours as needed for wheezing or shortness of breath. 02/18/17   Laurey Morale, MD  allopurinol (ZYLOPRIM) 300 MG tablet TAKE ONE TABLET BY MOUTH ONCE DAILY Patient taking differently: TAKE 300 mg BY MOUTH ONCE DAILY 11/14/16   Laurey Morale, MD  ALPRAZolam Duanne Moron) 0.5 MG tablet Take 1 tablet (0.5 mg total) by mouth at bedtime as needed for anxiety. 07/02/17   Laurey Morale, MD  aspirin 81 MG chewable tablet Chew 1 tablet (81 mg total) by mouth daily. 05/23/16   Reyne Dumas, MD  atorvastatin (LIPITOR) 40 MG tablet Take 1 tablet (40 mg total) by mouth daily at 6 PM. 07/02/17   Laurey Morale, MD  bisoprolol (ZEBETA) 5 MG tablet Take 1 tablet (5 mg total) by mouth daily. 07/02/17   Laurey Morale, MD  ciprofloxacin-dexamethasone (CIPRODEX) OTIC suspension Place 4 drops into the right ear 2 (two) times daily. Patient not taking: Reported on 07/02/2017 05/27/17   Laurey Morale, MD  clopidogrel (PLAVIX) 75 MG tablet Take 1 tablet (75 mg total) by mouth daily. 07/02/17   Laurey Morale, MD  DULoxetine (CYMBALTA) 60 MG capsule TAKE ONE CAPSULE BY MOUTH TWICE DAILY Patient taking differently: TAKE 60 mg  BY MOUTH TWICE DAILY 03/03/17   Laurey Morale, MD  fluticasone [facility]) 50 MCG/ACT nasal spray  Place 2 sprays into both nostrils daily as needed for allergies. Patient not taking: Reported on 07/02/2017 12/24/16   Laurey Morale, MD  furosemide (LASIX) 80 MG tablet Take 80mg  in AM, 40mg  in PM. 07/02/17   Laurey Morale, MD  HYDROcodone-acetaminophen (NORCO) 10-325 MG tablet Take 1 tablet by mouth every 6 (six) hours as needed for severe pain. 07/02/17   Laurey Morale, MD  Magnesium Oxide 400 MG CAPS Take 1 capsule (400  mg total) by mouth 2 (two) times daily. 10/30/16   Croitoru, Mihai, MD  nitroGLYCERIN (NITROSTAT) 0.4 MG SL tablet Place 0.4 mg under the tongue every 5 (five) minutes as needed for chest pain. X 3 doses    [provider]  OXYGEN Inhale 3.5 L into the lungs at bedtime.    [provider]  potassium chloride SA (K-DUR,KLOR-CON) 20 MEQ tablet Take 2 tablets (40 mEq total) by mouth 2 (two) times daily. 07/16/17   Laurey Morale, MD  PROAIR HFA 108 [phone] Base) MCG/ACT inhaler Inhale 2 puffs into the lungs every 6 (six) hours as needed for wheezing or shortness of breath.  02/17/17   [provider]  traZODone (DESYREL) 50 MG tablet Take 1 tablet (50 mg total) by mouth at bedtime. 07/02/17   Laurey Morale, MD    Family History Family History  Problem Relation Age of Onset  . CAD Father   . Heart disease Father   . Hyperlipidemia Father   . Heart disease Mother   . Deep vein thrombosis Son   . Hyperlipidemia Unknown   . Colon cancer Maternal Grandmother   . Cancer Sister        ovarian  . Diabetes Sister   . Heart disease Sister   . Anesthesia problems Neg Hx   . Hypotension Neg Hx   . Malignant hyperthermia Neg Hx   . Pseudochol deficiency Neg Hx     Social History Social History   Tobacco Use  . Smoking status: Current Every Day Smoker    Packs/day: 0.50    Years: 62.00    Pack years: 31.00    Types: Cigarettes  . Smokeless tobacco: Never Used  Substance Use Topics  . Alcohol use: No    Alcohol/week: 0.0 oz  . Drug use: No      Allergies   Potassium-containing compounds; Neomycin-polymyxin b gu; Azithromycin; Codeine; Darvon; Erythromycin; Meloxicam; Norco [hydrocodone-acetaminophen]; Penicillins; Propoxyphene n-acetaminophen; Rofecoxib; Rosuvastatin; Statins; and Sulfa antibiotics   Review of Systems Review of Systems  All other systems reviewed and are negative.    Physical Exam Updated Vital Signs BP (!) 150/69 (BP Location: Right Arm)   Pulse 96   Temp 98.3 F (36.8 C) (Oral)   Resp 20   Ht 5\' 5"  (1.651 m)   Wt 83.5 kg (184 lb)   SpO2 94%   BMI 30.62 kg/m   Physical Exam  Nursing note and vitals reviewed.  82 year old female, resting comfortably and in no acute distress. Vital signs are significant for elevated blood pressure. Oxygen saturation is 94%, which is normal. Head is normocephalic and atraumatic. PERRLA, EOMI. Oropharynx is clear. Neck is nontender and supple without adenopathy or JVD. Back is nontender and there is no CVA tenderness. Lungs have bibasilar rales and faint expiratory wheezes without rhonchi. Chest is nontender. Heart has regular rate and rhythm without murmur. Abdomen is soft, flat, nontender without masses or hepatosplenomegaly and peristalsis is normoactive. Extremities have 1+ presacral and 1+ pretibial edema, full range of motion is present. Skin is warm and dry without rash. Neurologic: Mental status is normal, cranial nerves are intact, there are no motor or sensory deficits.  ED Treatments / Results  Labs (all labs ordered are listed, but only abnormal results are displayed) Labs Reviewed  BASIC METABOLIC PANEL - Abnormal; Notable for the following components:      Result Value   Glucose, Bld 153 (*)    BUN 26 (*)  Creatinine, Ser 1.14 (*)    GFR calc non Af Amer 45 (*)    GFR calc Af Amer 52 (*)    All other components within normal limits  BRAIN NATRIURETIC PEPTIDE - Abnormal; Notable for the following components:   B Natriuretic Peptide  721.3 (*)    All other components within normal limits  CBC WITH DIFFERENTIAL/PLATELET - Abnormal; Notable for the following components:   RDW 16.6 (*)    All other components within normal limits  I-STAT TROPONIN, ED    EKG  EKG Interpretation  Date/Time:  Saturday July 18 2017 03:48:20 EST Ventricular Rate:  95 PR Interval:    QRS Duration: 120 QT Interval:  451 QTC Calculation: 567 R Axis:   113 Text Interpretation:  Atrial-sensed ventricular-paced rhythm No further analysis attempted due to paced rhythm When compared with ECG of 04/06/2017, No significant change was found Confirmed by Delora Fuel ([id number]) on 07/18/2017 3:54:04 AM       Radiology Dg Chest Port 1 View  Result Date: 07/18/2017 CLINICAL DATA:  Cough and shortness of breath for 2 weeks. Wheezing. EXAM: PORTABLE CHEST 1 VIEW COMPARISON:  02/17/2017 FINDINGS: Cardiac pacemaker. Cardiac enlargement with pulmonary vascular congestion. Perihilar and basilar interstitial disease consistent with edema. Small bilateral pleural effusions. No pneumothorax. Calcification of the aorta. IMPRESSION: Cardiac enlargement with pulmonary vascular congestion, bilateral pulmonary interstitial edema, and small bilateral pleural effusions. Similar appearance to previous study. Electronically Signed   By: Lucienne Capers M.D.   On: 07/18/2017 04:08    Procedures Procedures (including critical care time)  Medications Ordered in ED Medications  furosemide (LASIX) injection 80 mg (80 mg Intravenous Given 07/18/17 0636)     Initial Impression / Assessment and Plan / ED Course  I have reviewed the triage vital signs and the nursing notes.  Pertinent labs & imaging results that were available during my care of the patient were reviewed by me and considered in my medical decision making (see chart for details).  Progressive dyspnea in patient with both COPD and heart failure.  However, given her weight gain and presence of basilar rales,  I suspect her main problem is heart failure exacerbation.  Old records are reviewed, and she has several hospitalizations for heart failure, no recent ED visits or hospitalizations for her COPD.  Will check screening labs and chest x-ray.  Chest x-ray is most consistent with CHF.  BNP is moderately elevated, although not as severely elevated as in the past.  She is given a dose of furosemide and will be reassessed following diuresis.  She had reasonable diuresis from furosemide.  She was ambulated in the ED with minimal oxygen desaturation which is not out of line from her known respiratory failure.  She states she was no more dyspneic than she normally is with ambulating.  She was felt to be safe for discharge.  She is instructed to double her dose of furosemide for the next 3days, and then work with her PCP to arrange appropriate parameters for her to temporarily increase her furosemide dose.  Return precautions discussed.  Final Clinical Impressions(s) / ED Diagnoses   Final diagnoses:  Acute on chronic combined systolic (congestive) and diastolic (congestive) heart failure [facility])    ED Discharge Orders    None       Delora Fuel, MD 12/87/86 [phone]

## 2017-07-18 NOTE — ED Triage Notes (Signed)
Pt BIB EMS from home. C/o ongoing SOB for past 2 weeks that because significantly worse this AM. Used 3 nebs through the day yesterday, which is more than normal. Woke up this AM with significant SOB. Self-administered neb tx prior to calling 911. EMS reports wheezing lung sounds and administered 2 duonebs en route. Pt refused solu-medrol, citing a "bad reaction". Pt on home O2 at night only, 3.7Lpm. +smoker. +pacemaker. Denies CP.

## 2017-07-19 ENCOUNTER — Inpatient Hospital Stay (HOSPITAL_COMMUNITY)
Admission: EM | Admit: 2017-07-19 | Discharge: 2017-07-23 | DRG: 291 | Disposition: A | Discharge status: Home Health | Payer: Medicare Other | Attending: Internal Medicine | Admitting: Internal Medicine

## 2017-07-19 ENCOUNTER — Other Ambulatory Visit: Payer: Self-pay

## 2017-07-19 ENCOUNTER — Encounter (HOSPITAL_COMMUNITY): Payer: Self-pay | Admitting: Emergency Medicine

## 2017-07-19 ENCOUNTER — Emergency Department (HOSPITAL_COMMUNITY): Payer: Medicare Other

## 2017-07-19 DIAGNOSIS — F0390 Unspecified dementia without behavioral disturbance: Secondary | ICD-10-CM | POA: Diagnosis present

## 2017-07-19 DIAGNOSIS — Z79899 Other long term (current) drug therapy: Secondary | ICD-10-CM

## 2017-07-19 DIAGNOSIS — Z9119 Patient's noncompliance with other medical treatment and regimen: Secondary | ICD-10-CM | POA: Diagnosis not present

## 2017-07-19 DIAGNOSIS — E1151 Type 2 diabetes mellitus with diabetic peripheral angiopathy without gangrene: Secondary | ICD-10-CM | POA: Diagnosis present

## 2017-07-19 DIAGNOSIS — Z882 Allergy status to sulfonamides status: Secondary | ICD-10-CM

## 2017-07-19 DIAGNOSIS — E662 Morbid (severe) obesity with alveolar hypoventilation: Secondary | ICD-10-CM | POA: Diagnosis not present

## 2017-07-19 DIAGNOSIS — M797 Fibromyalgia: Secondary | ICD-10-CM | POA: Diagnosis present

## 2017-07-19 DIAGNOSIS — N183 Chronic kidney disease, stage 3 unspecified: Secondary | ICD-10-CM | POA: Diagnosis present

## 2017-07-19 DIAGNOSIS — E7849 Other hyperlipidemia: Secondary | ICD-10-CM | POA: Diagnosis not present

## 2017-07-19 DIAGNOSIS — J9621 Acute and chronic respiratory failure with hypoxia: Secondary | ICD-10-CM | POA: Diagnosis not present

## 2017-07-19 DIAGNOSIS — Z881 Allergy status to other antibiotic agents status: Secondary | ICD-10-CM

## 2017-07-19 DIAGNOSIS — Z888 Allergy status to other drugs, medicaments and biological substances status: Secondary | ICD-10-CM | POA: Diagnosis not present

## 2017-07-19 DIAGNOSIS — I340 Nonrheumatic mitral (valve) insufficiency: Secondary | ICD-10-CM | POA: Diagnosis present

## 2017-07-19 DIAGNOSIS — I252 Old myocardial infarction: Secondary | ICD-10-CM | POA: Diagnosis not present

## 2017-07-19 DIAGNOSIS — J449 Chronic obstructive pulmonary disease, unspecified: Secondary | ICD-10-CM | POA: Diagnosis present

## 2017-07-19 DIAGNOSIS — Z95810 Presence of automatic (implantable) cardiac defibrillator: Secondary | ICD-10-CM | POA: Diagnosis not present

## 2017-07-19 DIAGNOSIS — I5042 Chronic combined systolic (congestive) and diastolic (congestive) heart failure: Secondary | ICD-10-CM | POA: Diagnosis present

## 2017-07-19 DIAGNOSIS — I361 Nonrheumatic tricuspid (valve) insufficiency: Secondary | ICD-10-CM | POA: Diagnosis not present

## 2017-07-19 DIAGNOSIS — I130 Hypertensive heart and chronic kidney disease with heart failure and stage 1 through stage 4 chronic kidney disease, or unspecified chronic kidney disease: Principal | ICD-10-CM | POA: Diagnosis present

## 2017-07-19 DIAGNOSIS — I10 Essential (primary) hypertension: Secondary | ICD-10-CM | POA: Diagnosis not present

## 2017-07-19 DIAGNOSIS — I2510 Atherosclerotic heart disease of native coronary artery without angina pectoris: Secondary | ICD-10-CM | POA: Diagnosis not present

## 2017-07-19 DIAGNOSIS — K219 Gastro-esophageal reflux disease without esophagitis: Secondary | ICD-10-CM | POA: Diagnosis present

## 2017-07-19 DIAGNOSIS — R062 Wheezing: Secondary | ICD-10-CM | POA: Diagnosis not present

## 2017-07-19 DIAGNOSIS — I5043 Acute on chronic combined systolic (congestive) and diastolic (congestive) heart failure: Secondary | ICD-10-CM | POA: Diagnosis not present

## 2017-07-19 DIAGNOSIS — J9622 Acute and chronic respiratory failure with hypercapnia: Secondary | ICD-10-CM | POA: Diagnosis present

## 2017-07-19 DIAGNOSIS — Z9861 Coronary angioplasty status: Secondary | ICD-10-CM

## 2017-07-19 DIAGNOSIS — Z9114 Patient's other noncompliance with medication regimen: Secondary | ICD-10-CM | POA: Diagnosis not present

## 2017-07-19 DIAGNOSIS — E785 Hyperlipidemia, unspecified: Secondary | ICD-10-CM | POA: Diagnosis present

## 2017-07-19 DIAGNOSIS — Z885 Allergy status to narcotic agent status: Secondary | ICD-10-CM | POA: Diagnosis not present

## 2017-07-19 DIAGNOSIS — E1122 Type 2 diabetes mellitus with diabetic chronic kidney disease: Secondary | ICD-10-CM | POA: Diagnosis present

## 2017-07-19 DIAGNOSIS — I5020 Unspecified systolic (congestive) heart failure: Secondary | ICD-10-CM

## 2017-07-19 DIAGNOSIS — Z880 Allergy status to penicillin: Secondary | ICD-10-CM

## 2017-07-19 DIAGNOSIS — I110 Hypertensive heart disease with heart failure: Secondary | ICD-10-CM | POA: Diagnosis not present

## 2017-07-19 DIAGNOSIS — R0602 Shortness of breath: Secondary | ICD-10-CM | POA: Diagnosis not present

## 2017-07-19 DIAGNOSIS — G4733 Obstructive sleep apnea (adult) (pediatric): Secondary | ICD-10-CM | POA: Diagnosis present

## 2017-07-19 DIAGNOSIS — I509 Heart failure, unspecified: Secondary | ICD-10-CM | POA: Insufficient documentation

## 2017-07-19 DIAGNOSIS — Z955 Presence of coronary angioplasty implant and graft: Secondary | ICD-10-CM | POA: Diagnosis not present

## 2017-07-19 LAB — CBC WITH DIFFERENTIAL/PLATELET
BASOS PCT: 0 %
Basophils Absolute: 0 10*3/uL (ref 0.0–0.1)
EOS ABS: 0.2 10*3/uL (ref 0.0–0.7)
Eosinophils Relative: 1 %
HCT: 42.5 % (ref 36.0–46.0)
HEMOGLOBIN: 13.8 g/dL (ref 12.0–15.0)
LYMPHS ABS: 1.2 10*3/uL (ref 0.7–4.0)
Lymphocytes Relative: 7 %
MCH: 31.4 pg (ref 26.0–34.0)
MCHC: 32.5 g/dL (ref 30.0–36.0)
MCV: 96.6 fL (ref 78.0–100.0)
Monocytes Absolute: 0.9 10*3/uL (ref 0.1–1.0)
Monocytes Relative: 5 %
NEUTROS PCT: 87 %
Neutro Abs: 14.1 10*3/uL — ABNORMAL HIGH (ref 1.7–7.7)
Platelets: 383 10*3/uL (ref 150–400)
RBC: 4.4 MIL/uL (ref 3.87–5.11)
RDW: 16.7 % — ABNORMAL HIGH (ref 11.5–15.5)
WBC: 16.3 10*3/uL — AB (ref 4.0–10.5)

## 2017-07-19 LAB — I-STAT CHEM 8, ED
BUN: 22 mg/dL — AB (ref 6–20)
CALCIUM ION: 1.09 mmol/L — AB (ref 1.15–1.40)
Chloride: 101 mmol/L (ref 101–111)
Creatinine, Ser: 1 mg/dL (ref 0.44–1.00)
Glucose, Bld: 188 mg/dL — ABNORMAL HIGH (ref 65–99)
HCT: 41 % (ref 36.0–46.0)
Hemoglobin: 13.9 g/dL (ref 12.0–15.0)
Potassium: 4.2 mmol/L (ref 3.5–5.1)
SODIUM: 140 mmol/L (ref 135–145)
TCO2: 27 mmol/L (ref 22–32)

## 2017-07-19 LAB — I-STAT ARTERIAL BLOOD GAS, ED
ACID-BASE EXCESS: 2 mmol/L (ref 0.0–2.0)
Bicarbonate: 27.1 mmol/L (ref 20.0–28.0)
O2 SAT: 97 %
PH ART: 7.392 (ref 7.350–7.450)
Patient temperature: 98.6
TCO2: 28 mmol/L (ref 22–32)
pCO2 arterial: 44.6 mmHg (ref 32.0–48.0)
pO2, Arterial: 91 mmHg (ref 83.0–108.0)

## 2017-07-19 LAB — I-STAT TROPONIN, ED: TROPONIN I, POC: 0.03 ng/mL (ref 0.00–0.08)

## 2017-07-19 LAB — MRSA PCR SCREENING: MRSA BY PCR: NEGATIVE

## 2017-07-19 LAB — BRAIN NATRIURETIC PEPTIDE: B Natriuretic Peptide: 550.6 pg/mL — ABNORMAL HIGH (ref 0.0–100.0)

## 2017-07-19 LAB — MAGNESIUM: MAGNESIUM: 2.3 mg/dL (ref 1.7–2.4)

## 2017-07-19 MED ORDER — CLOPIDOGREL BISULFATE 75 MG PO TABS
75.0000 mg | ORAL_TABLET | Freq: Every day | ORAL | Status: DC
  Administered 2017-07-19 – 2017-07-23 (×5): 75 mg via ORAL
  Filled 2017-07-19 (×5): qty 1

## 2017-07-19 MED ORDER — TRAZODONE HCL 50 MG PO TABS
50.0000 mg | ORAL_TABLET | Freq: Every evening | ORAL | Status: DC | PRN
  Administered 2017-07-19 – 2017-07-20 (×2): 50 mg via ORAL
  Filled 2017-07-19 (×2): qty 1

## 2017-07-19 MED ORDER — MAGNESIUM OXIDE 400 (241.3 MG) MG PO TABS
400.0000 mg | ORAL_TABLET | Freq: Two times a day (BID) | ORAL | Status: DC
  Administered 2017-07-19 – 2017-07-23 (×4): 400 mg via ORAL
  Filled 2017-07-19 (×6): qty 1

## 2017-07-19 MED ORDER — ONDANSETRON HCL 4 MG/2ML IJ SOLN: 4.0000 mg | Freq: Four times a day (QID) | INTRAMUSCULAR | Status: DC | PRN

## 2017-07-19 MED ORDER — NITROGLYCERIN 2 % TD OINT
TOPICAL_OINTMENT | TRANSDERMAL | Status: AC
  Administered 2017-07-19: 1 [in_us]
  Filled 2017-07-19: qty 1

## 2017-07-19 MED ORDER — IPRATROPIUM-ALBUTEROL 0.5-2.5 (3) MG/3ML IN SOLN
3.0000 mL | Freq: Four times a day (QID) | RESPIRATORY_TRACT | Status: DC
  Administered 2017-07-19 (×2): 3 mL via RESPIRATORY_TRACT
  Filled 2017-07-19 (×2): qty 3

## 2017-07-19 MED ORDER — SODIUM CHLORIDE 0.9% FLUSH: 3.0000 mL | INTRAVENOUS | Status: DC | PRN

## 2017-07-19 MED ORDER — HYDROCODONE-ACETAMINOPHEN 10-325 MG PO TABS
1.0000 | ORAL_TABLET | Freq: Four times a day (QID) | ORAL | Status: DC | PRN
  Administered 2017-07-19 – 2017-07-20 (×2): 1 via ORAL
  Filled 2017-07-19 (×2): qty 1

## 2017-07-19 MED ORDER — NITROGLYCERIN 2 % TD OINT: 1.0000 [in_us] | TOPICAL_OINTMENT | Freq: Once | TRANSDERMAL | Status: DC

## 2017-07-19 MED ORDER — SODIUM CHLORIDE 0.9% FLUSH
3.0000 mL | Freq: Two times a day (BID) | INTRAVENOUS | Status: DC
  Administered 2017-07-19: 3 mL via INTRAVENOUS

## 2017-07-19 MED ORDER — NITROGLYCERIN 0.4 MG SL SUBL: 0.4000 mg | SUBLINGUAL_TABLET | SUBLINGUAL | Status: DC | PRN

## 2017-07-19 MED ORDER — ASPIRIN 81 MG PO CHEW
81.0000 mg | CHEWABLE_TABLET | Freq: Every day | ORAL | Status: DC
  Administered 2017-07-19 – 2017-07-23 (×5): 81 mg via ORAL
  Filled 2017-07-19 (×5): qty 1

## 2017-07-19 MED ORDER — FUROSEMIDE 10 MG/ML IJ SOLN
60.0000 mg | Freq: Two times a day (BID) | INTRAMUSCULAR | Status: DC
  Administered 2017-07-19: 60 mg via INTRAVENOUS
  Filled 2017-07-19: qty 6

## 2017-07-19 MED ORDER — ATORVASTATIN CALCIUM 40 MG PO TABS
40.0000 mg | ORAL_TABLET | Freq: Every day | ORAL | Status: DC
  Administered 2017-07-19 – 2017-07-22 (×4): 40 mg via ORAL
  Filled 2017-07-19 (×5): qty 1

## 2017-07-19 MED ORDER — ACETAMINOPHEN 325 MG PO TABS
650.0000 mg | ORAL_TABLET | ORAL | Status: DC | PRN
  Administered 2017-07-21 – 2017-07-22 (×2): 650 mg via ORAL
  Filled 2017-07-19 (×2): qty 2

## 2017-07-19 MED ORDER — ENOXAPARIN SODIUM 40 MG/0.4ML ~~LOC~~ SOLN
40.0000 mg | Freq: Every day | SUBCUTANEOUS | Status: DC
  Administered 2017-07-19 – 2017-07-23 (×5): 40 mg via SUBCUTANEOUS
  Filled 2017-07-19 (×6): qty 0.4

## 2017-07-19 MED ORDER — FUROSEMIDE 10 MG/ML IJ SOLN
40.0000 mg | Freq: Once | INTRAMUSCULAR | Status: AC
  Administered 2017-07-19: 40 mg via INTRAVENOUS
  Filled 2017-07-19: qty 4

## 2017-07-19 MED ORDER — SODIUM CHLORIDE 0.9 % IV SOLN: 250.0000 mL | INTRAVENOUS | Status: DC | PRN

## 2017-07-19 MED ORDER — ALLOPURINOL 300 MG PO TABS
300.0000 mg | ORAL_TABLET | Freq: Every day | ORAL | Status: DC
  Administered 2017-07-19 – 2017-07-23 (×5): 300 mg via ORAL
  Filled 2017-07-19 (×5): qty 1

## 2017-07-19 MED ORDER — ALBUTEROL SULFATE (2.5 MG/3ML) 0.083% IN NEBU
2.5000 mg | INHALATION_SOLUTION | RESPIRATORY_TRACT | Status: DC | PRN
  Filled 2017-07-19: qty 3

## 2017-07-19 MED ORDER — DULOXETINE HCL 60 MG PO CPEP
60.0000 mg | ORAL_CAPSULE | Freq: Two times a day (BID) | ORAL | Status: DC
  Administered 2017-07-19 – 2017-07-23 (×9): 60 mg via ORAL
  Filled 2017-07-19 (×9): qty 1

## 2017-07-19 MED ORDER — ALPRAZOLAM 0.5 MG PO TABS
0.5000 mg | ORAL_TABLET | Freq: Every evening | ORAL | Status: DC | PRN
  Administered 2017-07-19 – 2017-07-22 (×5): 0.5 mg via ORAL
  Filled 2017-07-19 (×5): qty 1

## 2017-07-19 MED ORDER — POTASSIUM CHLORIDE CRYS ER 20 MEQ PO TBCR
40.0000 meq | EXTENDED_RELEASE_TABLET | Freq: Two times a day (BID) | ORAL | Status: DC
  Administered 2017-07-19 (×2): 40 meq via ORAL
  Filled 2017-07-19 (×2): qty 2

## 2017-07-19 MED ORDER — BISOPROLOL FUMARATE 5 MG PO TABS
5.0000 mg | ORAL_TABLET | Freq: Every day | ORAL | Status: DC
  Administered 2017-07-19 – 2017-07-23 (×5): 5 mg via ORAL
  Filled 2017-07-19 (×5): qty 1

## 2017-07-19 MED ORDER — FUROSEMIDE 10 MG/ML IJ SOLN
80.0000 mg | Freq: Two times a day (BID) | INTRAMUSCULAR | Status: DC
  Administered 2017-07-19 – 2017-07-20 (×2): 80 mg via INTRAVENOUS
  Filled 2017-07-19 (×2): qty 8

## 2017-07-19 NOTE — H&P (Addendum)
History and Physical    Sydney Phillips KZS:[id number] DOB: [birth year] DOA: 07/19/2017  Referring MD/NP/PA: Dr. Denese Killings PCP: Laurey Morale, MD  Patient coming from: Home via EMS  Chief Complaint: Shortness of breath  I have personally briefly reviewed patient's old medical records in [place]   HPI: Sydney Phillips is a 82 y.o. female with medical history significant of \\chronic  combined CHF (EF 20-25% per last echo in July 2018), CAD s/p PCI, AICD/PPMCKD 3, COPD with chronic hypoxic respiratory failure (but uses 2L [place] as needed), DM type 2, HTN, HLD, and PAD S/P fempop bypass to right leg; who with worsening shortness of breath over the last 1-1/2 weeks.  She complains of dyspnea with exertion, wheezes, cough, and orthopnea.  Associated symptoms include lower extremity swelling, abdominal distention, and generalized weakness.  Denies having any significant chest pain, fever, chills, nausea, vomiting.  Patient reports trying to use her albuterol inhaler and nebulizer with only mild relief of symptoms.  She called 911 yesterday morning and was brought to the emergency department, but had refused IV Solu-Medrol stating that it causes her to have a bad reaction as well as magnesium sulfate.  Patient was evaluated in the emergency department found to have pulmonary edema with pleural effusions and elevated BNP of 721.3.  She was given 80 mg of Lasix IV with what was thought to be adequate diuresis, and ultimately discharged home with recommendations to increase home Lasix dosage.  However, once patient got home reported progressively worsening shortness of breath therefore calling EMS again who brought her to the hospital.    ED Course: En route with EMS patient had refused steroids and magnesium and had been given at least 1 DuoNeb breathing treatment.  Upon admission into the emergency department patient was seen to be afebrile, pulse 88-113, respirations 20-38, blood pressures  maintained, O2 saturations as low as 84%.  Patient was placed on BiPAP due to significant respiratory distress on admission with improved work of breathing.  ABG noted pH of 7.392, PCO2 44.6, O2 sat 91% on BiPAP.  Labs revealed WBC 16.3, hemoglobin 13.8, BUN 26, creatinine 1.14, glucose 156, repeat BNP improved from 721.3 to 553, and troponin 0.03.  Chest x-ray showed cardiac enlargement with pulmonary edema and bilateral small pleural effusions.  Patient was given 40 mg of Lasix IV.  TRH called to admit.  Review of Systems  Constitutional: Positive for malaise/fatigue. Negative for chills and weight loss.  HENT: Negative for congestion and ear discharge.   Eyes: Negative for photophobia and pain.  Respiratory: Positive for cough, shortness of breath and wheezing.   Cardiovascular: Positive for leg swelling. Negative for chest pain.  Gastrointestinal: Negative for abdominal pain and diarrhea.       Positive for abdominal distention  Genitourinary: Negative for dysuria and flank pain.  Musculoskeletal: Negative for back pain and myalgias.  Skin: Negative for itching and rash.  Neurological: Negative for sensory change and speech change.  Endo/Heme/Allergies: Negative for environmental allergies and polydipsia.  Psychiatric/Behavioral: Negative for substance abuse and suicidal ideas.    Past Medical History:  Diagnosis Date  . Anxiety   . Arthritis   . Asthma   . Automatic implantable cardioverter-defibrillator in situ   . Bronchitis   . CAD (coronary artery disease)    a. s/p DES to LCx/RCA 05/2016, ostial LAD disease.  . Cardiomyopathy ([place])   . Chronic back pain   . Chronic combined systolic and diastolic CHF (congestive heart  failure) ([place])   . Chronic constipation   . Chronic pain   . CKD (chronic kidney disease), stage III ([place])   . Colon polyps 2003.  2015.   HP polyps 2003.  adnomas 2015.  required referal to [facility] for colonoscopic resection of flat polyps.   Marland Kitchen COPD  (chronic obstructive pulmonary disease) ([place])   . Dementia   . Depression   . Depression with anxiety    takes Cymbalta daily  . Diabetes mellitus ([place])   . Dyspnea   . Early cataracts, bilateral   . Fibromyalgia   . GERD (gastroesophageal reflux disease)    was on meds but was taken off;now watches what she eats  . Hemorrhoids   . History of kidney stones   . Hx of colonic polyps   . Hyperlipemia    takes Crestor daily  . Hypertension    takes Amlodipine and Metoprolol daily  . Insomnia   . LBBB (left bundle branch block)    Stress test 09/03/2010, EF 55  . Myocardial infarction ([place])   . PAD (peripheral artery disease) ([facility])    Carotid, subclavian, and lower extremity beds, currently not symptomatic  . Presence of combination internal cardiac defibrillator (ICD) and pacemaker   . Presence of permanent cardiac pacemaker   . Pulmonary hypertension ([place])   . S/P angioplasty with stent, lt. subclavian 07/31/11 08/01/2011  . Subclavian arterial stenosis, lt, with PTA/STENT 07/31/11 08/01/2011  . Syncope 07/28/2011   EF - 50-55, moderate concentric hypertrophy in left ventricle  . Urinary incontinence   . Vertigo    takes Meclizine prn    Past Surgical History:  Procedure Laterality Date  . ABDOMINAL AORTAGRAM N/A 08/12/2013   Procedure: ABDOMINAL Maxcine Ham;  Surgeon: Elam Dutch, MD;  Location: [facility] CATH LAB;  Service: Cardiovascular;  Laterality: N/A;  . ABDOMINAL HYSTERECTOMY    . APPENDECTOMY    . BACK SURGERY  2012  . BIV ICD GENERTAOR CHANGE OUT Left 02/20/2012   Procedure: BIV ICD GENERTAOR CHANGE OUT;  Surgeon: [name], MD;  Location: [facility] CATH LAB;  Service: Cardiovascular;  Laterality: Left;  . CARDIAC CATHETERIZATION  12/01/2007   By Dr. Melvern Banker, left heart cath,   . CARDIAC CATHETERIZATION N/A 05/20/2016   Procedure: Right/Left Heart Cath and Coronary Angiography;  Surgeon: Sherren Mocha, MD;  Location: [place] CV LAB;  Service: Cardiovascular;  Laterality:  N/A;  . CARDIAC CATHETERIZATION N/A 05/20/2016   Procedure: Coronary Stent Intervention;  Surgeon: Sherren Mocha, MD;  Location: [place] CV LAB;  Service: Cardiovascular;  Laterality: N/A;  . CARDIAC DEFIBRILLATOR PLACEMENT  05/2008   By Dr Blanch Media, [employer] CANNOT HAVE MRI's  . CAROTID ANGIOGRAM N/A 07/31/2011   Procedure: CAROTID ANGIOGRAM;  Surgeon: Lorretta Harp, MD;  Location: [facility] CATH LAB;  Service: Cardiovascular;  Laterality: N/A;  carotid angiogram and possible Lt SCA PTA  . COLONOSCOPY W/ POLYPECTOMY  12/2013  . CORONARY ANGIOPLASTY    . ENDARTERECTOMY Left 11/08/2014   Procedure: LEFT CAROTID ENDARTERECTOMY WITH HEMASHIELD PATCH ANGIOPLASTY;  Surgeon: Elam Dutch, MD;  Location: [place];  Service: Vascular;  Laterality: Left;  . ESOPHAGOGASTRODUODENOSCOPY N/A 01/20/2014   Procedure: ESOPHAGOGASTRODUODENOSCOPY (EGD);  Surgeon: Jerene Bears, MD;  Location: [facility] ENDOSCOPY;  Service: Endoscopy;  Laterality: N/A;  . FEMORAL-POPLITEAL BYPASS GRAFT Right 10/12/2013   Procedure:   Femoral-Peroneal trunk  bypass with nonreversed greater saphenous vein graft;  Surgeon: Elam Dutch, MD;  Location: [place];  Service: Vascular;  Laterality:  Right;  Marland Kitchen GIVENS CAPSULE STUDY N/A 01/20/2014   Procedure: GIVENS CAPSULE STUDY;  Surgeon: Jerene Bears, MD;  Location: [place];  Service: Gastroenterology;  Laterality: N/A;  . ICD GENERATOR CHANGEOUT N/A 03/04/2017   Procedure: ICD Generator Changeout;  Surgeon: [name], MD;  Location: [place] CV LAB;  Service: Cardiovascular;  Laterality: N/A;  . INSERT / REPLACE / REMOVE PACEMAKER    . INTRAOPERATIVE ARTERIOGRAM Right 10/12/2013   Procedure: INTRA OPERATIVE ARTERIOGRAM;  Surgeon: Elam Dutch, MD;  Location: [place];  Service: Vascular;  Laterality: Right;  . ORIF ELBOW FRACTURE  08/16/2011   Procedure: OPEN REDUCTION INTERNAL FIXATION (ORIF) ELBOW/OLECRANON FRACTURE;  Surgeon: Schuyler Amor, MD;  Location: [place];  Service:  Orthopedics;  Laterality: Left;  . RENAL ANGIOGRAM N/A 08/12/2013   Procedure: RENAL ANGIOGRAM;  Surgeon: Elam Dutch, MD;  Location: [facility] CATH LAB;  Service: Cardiovascular;  Laterality: N/A;  . SUBCLAVIAN STENT PLACEMENT Left 07/31/2011   7x18 [place], balloon, with reduction of 90% ostial left subclavian artery stenosis to 0% with residual excellent flow  . TONSILLECTOMY    . TUBAL LIGATION       reports that she has been smoking cigarettes.  She has a 31.00 pack-year smoking history. she has never used smokeless tobacco. She reports that she does not drink alcohol or use drugs.  Allergies  Allergen Reactions  . Potassium-Containing Compounds Other (See Comments)    Causes severe constipation  . Neomycin-Polymyxin B Gu     Swollen throat   . Azithromycin Rash  . Codeine Itching  . Darvon Itching  . Erythromycin Rash  . Meloxicam Other (See Comments)    Unknown    . Norco [Hydrocodone-Acetaminophen] Itching  . Penicillins Rash and Other (See Comments)    Has patient had a PCN reaction causing immediate rash, facial/tongue/throat swelling, SOB or lightheadedness with hypotension: unknown Has patient had a PCN reaction causing severe rash involving mucus membranes or skin necrosis: no Has patient had a PCN reaction that required hospitalization: unknown Has patient had a PCN reaction occurring within the last 10 years: no If all of the above answers are "NO", then may proceed with Cephalosporin use.   Marland Kitchen Propoxyphene N-Acetaminophen Itching  . Rofecoxib Other (See Comments)    Unknown    . Rosuvastatin Other (See Comments)    cramps  . Statins Itching and Other (See Comments)    Sleeplessness  . Sulfa Antibiotics Rash    Family History  Problem Relation Age of Onset  . CAD Father   . Heart disease Father   . Hyperlipidemia Father   . Heart disease Mother   . Deep vein thrombosis Son   . Hyperlipidemia Unknown   . Colon cancer Maternal Grandmother   . Cancer Sister         ovarian  . Diabetes Sister   . Heart disease Sister   . Anesthesia problems Neg Hx   . Hypotension Neg Hx   . Malignant hyperthermia Neg Hx   . Pseudochol deficiency Neg Hx     Prior to Admission medications   Medication Sig Start Date End Date Taking? Authorizing Provider  ACCU-CHEK FASTCLIX LANCETS MISC USE   TO CHECK GLUCOSE ONCE DAILY 02/18/17   Laurey Morale, MD  ACCU-CHEK SMARTVIEW test strip CHECK BLOOD SUGAR ONCE DAILY 02/18/17   Laurey Morale, MD  acetaminophen (TYLENOL) 325 MG tablet Take 325 mg by mouth every 6 (six) hours as needed for mild  pain.    [provider]  albuterol (PROVENTIL) (2.5 MG/3ML) 0.083% nebulizer solution Take 3 mLs (2.5 mg total) by nebulization every 4 (four) hours as needed for wheezing or shortness of breath. 02/18/17   Laurey Morale, MD  allopurinol (ZYLOPRIM) 300 MG tablet TAKE ONE TABLET BY MOUTH ONCE DAILY Patient taking differently: TAKE 300 mg BY MOUTH ONCE DAILY 11/14/16   Laurey Morale, MD  ALPRAZolam Duanne Moron) 0.5 MG tablet Take 1 tablet (0.5 mg total) by mouth at bedtime as needed for anxiety. 07/02/17   Laurey Morale, MD  aspirin 81 MG chewable tablet Chew 1 tablet (81 mg total) by mouth daily. 05/23/16   Reyne Dumas, MD  atorvastatin (LIPITOR) 40 MG tablet Take 1 tablet (40 mg total) by mouth daily at 6 PM. 07/02/17   Laurey Morale, MD  bisoprolol (ZEBETA) 5 MG tablet Take 1 tablet (5 mg total) by mouth daily. 07/02/17   Laurey Morale, MD  clopidogrel (PLAVIX) 75 MG tablet Take 1 tablet (75 mg total) by mouth daily. 07/02/17   Laurey Morale, MD  DULoxetine (CYMBALTA) 60 MG capsule TAKE ONE CAPSULE BY MOUTH TWICE DAILY Patient taking differently: TAKE 60 mg  BY MOUTH TWICE DAILY 03/03/17   Laurey Morale, MD  furosemide (LASIX) 80 MG tablet Take 80mg  in AM, 40mg  in PM. 07/02/17   Laurey Morale, MD  HYDROcodone-acetaminophen (NORCO) 10-325 MG tablet Take 1 tablet by mouth every 6 (six) hours as needed for severe pain. 07/02/17   Laurey Morale, MD  Magnesium Oxide 400 MG CAPS Take 1 capsule (400 mg total) by mouth 2 (two) times daily. 10/30/16   Croitoru, Mihai, MD  nitroGLYCERIN (NITROSTAT) 0.4 MG SL tablet Place 0.4 mg under the tongue every 5 (five) minutes as needed for chest pain. X 3 doses    [provider]  OXYGEN Inhale 3.5 L into the lungs at bedtime.    [provider]  potassium chloride SA (K-DUR,KLOR-CON) 20 MEQ tablet Take 2 tablets (40 mEq total) by mouth 2 (two) times daily. 07/16/17   Laurey Morale, MD  PROAIR HFA 108 [phone] Base) MCG/ACT inhaler Inhale 2 puffs into the lungs every 6 (six) hours as needed for wheezing or shortness of breath.  02/17/17   [provider]  traZODone (DESYREL) 50 MG tablet Take 1 tablet (50 mg total) by mouth at bedtime. Patient taking differently: Take 50 mg by mouth at bedtime as needed for sleep.  07/02/17   Laurey Morale, MD    Physical Exam:  Constitutional: Elderly female in mild respiratory distress on BiPAP Vitals:   07/19/17 0342 07/19/17 0357 07/19/17 0400 07/19/17 0430  BP:  (!) 160/90 (!) 150/73 135/76  Pulse:  (!) 111 (!) 109 (!) 102  Resp:  (!) 33 (!) 34 (!) 26  Temp:      TempSrc:      SpO2:  96% 96% 96%  Weight: 83.5 kg (184 lb)     Height: 5\' 5"  (1.651 m)      Eyes: PERRL, lids and conjunctivae normal ENMT: Mucous membranes are moist. Posterior pharynx clear of any exudate or lesions.  Neck: normal, supple, no masses, no thyromegaly Respiratory: Tachypneic with decreased aeration positive crackles and wheezes appreciated Cardiovascular: Regular rate and rhythm, positive systolic ejection murmurs. no rubs / gallops.  Trace to 1+ lower extremity edema. 2+ pedal pulses. No carotid bruits.  Abdomen: Protuberant abdomen, no tenderness, no masses palpated. No  hepatosplenomegaly. Bowel sounds positive.  Musculoskeletal: no clubbing / cyanosis. No joint deformity upper and lower extremities. Good ROM, no contractures. Normal muscle tone.    Skin: no rashes, lesions, ulcers. No induration Neurologic: CN 2-12 grossly intact. Sensation intact, DTR normal. Strength 5/5 in all 4.  Psychiatric: Normal judgment and insight. Alert and oriented x 3. Normal mood.     Labs on Admission: I have personally reviewed following labs and imaging studies  CBC: Recent Labs  Lab 07/18/17 0346 07/19/17 0326  WBC 10.0 16.3*  NEUTROABS 7.3 14.1*  HGB 12.9 13.8  HCT 40.8 42.5  MCV 97.4 96.6  PLT 272 683   Basic Metabolic Panel: Recent Labs  Lab 07/18/17 0346 07/19/17 0337  NA 142  --   K 3.7  --   CL 103  --   CO2 28  --   GLUCOSE 153*  --   BUN 26*  --   CREATININE 1.14*  --   CALCIUM 9.3  --   MG  --  2.3   GFR: Estimated Creatinine Clearance: 42.7 mL/min (A) (by C-G formula based on SCr of 1.14 mg/dL (H)). Liver Function Tests: No results for input(s): AST, ALT, ALKPHOS, BILITOT, PROT, ALBUMIN in the last 168 hours. No results for input(s): LIPASE, AMYLASE in the last 168 hours. No results for input(s): AMMONIA in the last 168 hours. Coagulation Profile: No results for input(s): INR, PROTIME in the last 168 hours. Cardiac Enzymes: No results for input(s): CKTOTAL, CKMB, CKMBINDEX, TROPONINI in the last 168 hours. BNP (last 3 results) No results for input(s): PROBNP in the last 8760 hours. HbA1C: No results for input(s): HGBA1C in the last 72 hours. CBG: No results for input(s): GLUCAP in the last 168 hours. Lipid Profile: No results for input(s): CHOL, HDL, LDLCALC, TRIG, CHOLHDL, LDLDIRECT in the last 72 hours. Thyroid Function Tests: No results for input(s): TSH, T4TOTAL, FREET4, T3FREE, THYROIDAB in the last 72 hours. Anemia Panel: No results for input(s): VITAMINB12, FOLATE, FERRITIN, TIBC, IRON, RETICCTPCT in the last 72 hours. Urine analysis:    Component Value Date/Time   COLORURINE YELLOW 12/17/2016 [place] 12/17/2016 1634   LABSPEC 1.008 12/17/2016 1634   PHURINE 6.0 12/17/2016 1634    GLUCOSEU NEGATIVE 12/17/2016 1634   HGBUR NEGATIVE 12/17/2016 1634   HGBUR negative 01/01/2010 1353   BILIRUBINUR negative 07/02/2017 1025   KETONESUR NEGATIVE 12/17/2016 1634   PROTEINUR negative 07/02/2017 1025   PROTEINUR NEGATIVE 12/17/2016 1634   UROBILINOGEN 0.2 07/02/2017 1025   UROBILINOGEN 1.0 11/03/2014 1442   NITRITE negative 07/02/2017 1025   NITRITE NEGATIVE 12/17/2016 1634   LEUKOCYTESUR Negative 07/02/2017 1025   Sepsis Labs: No results found for this or any previous visit (from the past 240 hour(s)).   Radiological Exams on Admission: Dg Chest Portable 1 View  Result Date: 07/19/2017 CLINICAL DATA:  Shortness of breath. History of cardiomyopathy, COPD. EXAM: PORTABLE CHEST 1 VIEW COMPARISON:  Chest radiograph July 18, 2017 FINDINGS: Stable moderate to severe cardiomegaly. Calcified aortic knob. Chronic interstitial changes and increased lung volumes without focal consolidation. Minimal LEFT pleural effusion versus pleural thickening, RIGHT costophrenic angle not imaged. LEFT midlung zone granuloma. Apical pleural thickening. LEFT cardiac defibrillator in situ. Osteopenia. IMPRESSION: Stable cardiomegaly. Chronic interstitial changes seen with COPD and chronic pulmonary edema. Minimal LEFT pleural effusion versus pleural thickening. Electronically Signed   By: Elon Alas M.D.   On: 07/19/2017 04:33   Dg Chest Port 1 View  Result Date: 07/18/2017  CLINICAL DATA:  Cough and shortness of breath for 2 weeks. Wheezing. EXAM: PORTABLE CHEST 1 VIEW COMPARISON:  02/17/2017 FINDINGS: Cardiac pacemaker. Cardiac enlargement with pulmonary vascular congestion. Perihilar and basilar interstitial disease consistent with edema. Small bilateral pleural effusions. No pneumothorax. Calcification of the aorta. IMPRESSION: Cardiac enlargement with pulmonary vascular congestion, bilateral pulmonary interstitial edema, and small bilateral pleural effusions. Similar appearance to previous  study. Electronically Signed   By: Lucienne Capers M.D.   On: 07/18/2017 04:08    Chest x-ray: Independently reviewed.  Cardiomegaly with bilateral pulmonary interstitial edema and pleural effusions  Assessment/Plan Acute on chronic respiratory failure  2/2 combined CHF exacerbation, pulmonary edema: Patient presents with progressively worsening shortness of breath labs 1-2 weeks.  Found to have elevated BNP and pulmonary edema on chest x-ray.  Patient physical exam reveals abdominal distention and mild lower extremity edema.   - Admit to a telemetry bed - Heart failure orders set  initiated  - Continue BiPAP and wean as tolerated - Continuous pulse oximetry with nasal cannula oxygen as needed to keep O2 saturations >92% - Strict I&Os and daily weights - Elevate lower extremities - Lasix 60 mg IV Bid - Reassess in a.m. and adjust diuresis as needed. - Check echocardiogram - Optimize medical management as able  - May warrant consultation to cardiology in a.m.   Status post AICD/PPM  CAD s/p PCI  Essential hypertension - Continue bisoprolol  CKD stage III: Creatinine appears near baseline which appears to range from 1-1.2. - Continue to monitor  Hyperlipidemia - Continue atorvastatin  DVT prophylaxis: lovenox Code Status: full  Family Communication: Discussed plan of care with the patient and family present at bedside Disposition Plan: Possible discharge home in 2-3 days Consults called: none Admission status: inpatient   Norval Morton MD [facility] Pager [phone]   If 7PM-7AM, please contact night-coverage [email or web address] Password [facility]  07/19/2017, 4:52 AM

## 2017-07-19 NOTE — ED Provider Notes (Signed)
[facility] EMERGENCY DEPARTMENT Provider Note   CSN: [id number] Arrival date & time: 07/19/17  0320     History   Chief Complaint Chief Complaint  Patient presents with  . Respiratory Distress    HPI Sydney Phillips is a 82 y.o. female.  The history is provided by the EMS personnel. The history is limited by the condition of the patient (respiratory distress).  Shortness of Breath  This is a recurrent problem. The problem occurs continuously.The current episode started yesterday. The problem has not changed since onset.Associated symptoms include wheezing. Pertinent negatives include no fever and no abdominal pain. It is unknown what precipitated the problem. The treatment provided no relief. She has had prior hospitalizations. She has had prior ED visits. Associated medical issues include COPD and heart failure.  Seen yesterday for same in ED, told to increase lasix symptoms returned and are worse.    Past Medical History:  Diagnosis Date  . Anxiety   . Arthritis   . Asthma   . Automatic implantable cardioverter-defibrillator in situ   . Bronchitis   . CAD (coronary artery disease)    a. s/p DES to LCx/RCA 05/2016, ostial LAD disease.  . Cardiomyopathy ([place])   . Chronic back pain   . Chronic combined systolic and diastolic CHF (congestive heart failure) ([place])   . Chronic constipation   . Chronic pain   . CKD (chronic kidney disease), stage III ([place])   . Colon polyps 2003.  2015.   HP polyps 2003.  adnomas 2015.  required referal to [facility] for colonoscopic resection of flat polyps.   Marland Kitchen COPD (chronic obstructive pulmonary disease) ([place])   . Dementia   . Depression   . Depression with anxiety    takes Cymbalta daily  . Diabetes mellitus ([place])   . Dyspnea   . Early cataracts, bilateral   . Fibromyalgia   . GERD (gastroesophageal reflux disease)    was on meds but was taken off;now watches what she eats  . Hemorrhoids   . History of kidney stones    . Hx of colonic polyps   . Hyperlipemia    takes Crestor daily  . Hypertension    takes Amlodipine and Metoprolol daily  . Insomnia   . LBBB (left bundle branch block)    Stress test 09/03/2010, EF 55  . Myocardial infarction ([place])   . PAD (peripheral artery disease) (HCC)    Carotid, subclavian, and lower extremity beds, currently not symptomatic  . Presence of combination internal cardiac defibrillator (ICD) and pacemaker   . Presence of permanent cardiac pacemaker   . Pulmonary hypertension ([place])   . S/P angioplasty with stent, lt. subclavian 07/31/11 08/01/2011  . Subclavian arterial stenosis, lt, with PTA/STENT 07/31/11 08/01/2011  . Syncope 07/28/2011   EF - 50-55, moderate concentric hypertrophy in left ventricle  . Urinary incontinence   . Vertigo    takes Meclizine prn    Patient Active Problem List   Diagnosis Date Noted  . Poor dentition 04/01/2017  . Tobacco abuse 12/17/2016  . Elevated troponin 06/26/2016  . Steroid-induced psychosis, with hallucinations ([place])   . Chronic respiratory failure with hypoxia ([place])   . Acute encephalopathy 05/30/2016  . CKD (chronic kidney disease), stage III ([place]) 05/30/2016  . Elevated lactic acid level 05/30/2016  . Leukocytosis   . Stenosis of left subclavian artery ([place]) 05/19/2016  . Fall at home   . Somnolence   . OSA and COPD overlap syndrome ([place])   .  Cardiomyopathy, ischemic   . Ischemic mitral valve regurgitation   . Pulmonary hypertension ([place])   . Controlled diabetes mellitus type 2 with complications ([place])   . Encephalopathy   . CAD S/P percutaneous coronary angioplasty   . Pleural effusion   . Altered mental state 02/26/2016  . Skin lesion of back/upper right shoulder 02/26/2016  . Smoking 11/07/2015  . Arthralgia 10/12/2015  . Left carotid stenosis 11/08/2014  . NSTEMI (non-ST elevated myocardial infarction) ([place]) 01/20/2014  . Melena 01/20/2014  . Microcytic anemia 01/19/2014  . PAD (peripheral artery disease)  ([place]) 10/12/2013  . S/P angioplasty with stent, lt. subclavian 07/31/11 08/01/2011  . Biventricular implantable cardioverter-defibrillator in situ 07/28/2011  . PVD, known severe, (previuosly asymptomatic) LSCA disease, now with "high grade" RICA disease. 07/28/2011  . Vertigo 07/27/2011  . Hypokalemia 07/27/2011  . SPINAL STENOSIS 01/28/2010  . CONSTIPATION 01/14/2010  . Insomnia 01/23/2009  . Coronary atherosclerosis 06/21/2008  . HIP PAIN, BILATERAL 06/21/2008  . Myalgia and myositis 11/26/2007  . Hypercholesterolemia 07/28/2007  . WEIGHT GAIN 06/04/2007  . COPD with emphysema ([place]) 03/02/2007  . Depression with anxiety 02/25/2007  . Essential hypertension 02/25/2007  . GERD 02/25/2007  . COLONIC POLYPS, HX OF 02/25/2007    Past Surgical History:  Procedure Laterality Date  . ABDOMINAL AORTAGRAM N/A 08/12/2013   Procedure: ABDOMINAL Maxcine Ham;  Surgeon: Elam Dutch, MD;  Location[facility] C[facility] LAB;  Service: Cardiovascular;  Laterality: N/A;  . ABDOMINAL HYSTERECTOMY    . APPENDECTOMY    . BACK SURGERY  2012  . BIV ICD GENERTAOR CHANGE OUT Left 02/20/2012   Procedure: BIV ICD GENERTAOR CHANGE OUT;  Surgeon: [name], MD;  Location: [facility] CATH LAB;  Service: Cardiovascular;  Laterality: Left;  . CARDIAC CATHETERIZATION  12/01/2007   By Dr. Melvern Banker, left heart cath,   . CARDIAC CATHETERIZATION N/A 05/20/2016   Procedure: Right/Left Heart Cath and Coronary Angiography;  Surgeon: Sherren Mocha, MD;  Location: [place] CV LAB;  Service: Cardiovascular;  Laterality: N/A;  . CARDIAC CATHETERIZATION N/A 05/20/2016   Procedure: Coronary Stent Intervention;  Surgeon: Sherren Mocha, MD;  Location: [place] CV LAB;  Service: Cardiovascular;  Laterality: N/A;  . CARDIAC DEFIBRILLATOR PLACEMENT  05/2008   By Dr Blanch Media, [employer] CANNOT HAVE MRI's  . CAROTID ANGIOGRAM N/A 07/31/2011   Procedure: CAROTID ANGIOGRAM;  Surgeon: Lorretta Harp, MD;  Location: [facility] CATH LAB;  Service:  Cardiovascular;  Laterality: N/A;  carotid angiogram and possible Lt SCA PTA  . COLONOSCOPY W/ POLYPECTOMY  12/2013  . CORONARY ANGIOPLASTY    . ENDARTERECTOMY Left 11/08/2014   Procedure: LEFT CAROTID ENDARTERECTOMY WITH HEMASHIELD PATCH ANGIOPLASTY;  Surgeon: Elam Dutch, MD;  Location: [place];  Service: Vascular;  Laterality: Left;  . ESOPHAGOGASTRODUODENOSCOPY N/A 01/20/2014   Procedure: ESOPHAGOGASTRODUODENOSCOPY (EGD);  Surgeon: Jerene Bears, MD;  Location: [facility] ENDOSCOPY;  Service: Endoscopy;  Laterality: N/A;  . FEMORAL-POPLITEAL BYPASS GRAFT Right 10/12/2013   Procedure:   Femoral-Peroneal trunk  bypass with nonreversed greater saphenous vein graft;  Surgeon: Elam Dutch, MD;  Location: [place];  Service: Vascular;  Laterality: Right;  . GIVENS CAPSULE STUDY N/A 01/20/2014   Procedure: GIVENS CAPSULE STUDY;  Surgeon: Jerene Bears, MD;  Location: [place];  Service: Gastroenterology;  Laterality: N/A;  . ICD GENERATOR CHANGEOUT N/A 03/04/2017   Procedure: ICD Generator Changeout;  Surgeon: [name], MD;  Location: [place] CV LAB;  Service: Cardiovascular;  Laterality: N/A;  . INSERT / REPLACE /  REMOVE PACEMAKER    . INTRAOPERATIVE ARTERIOGRAM Right 10/12/2013   Procedure: INTRA OPERATIVE ARTERIOGRAM;  Surgeon: Elam Dutch, MD;  Location: [place];  Service: Vascular;  Laterality: Right;  . ORIF ELBOW FRACTURE  08/16/2011   Procedure: OPEN REDUCTION INTERNAL FIXATION (ORIF) ELBOW/OLECRANON FRACTURE;  Surgeon: Schuyler Amor, MD;  Location: [place];  Service: Orthopedics;  Laterality: Left;  . RENAL ANGIOGRAM N/A 08/12/2013   Procedure: RENAL ANGIOGRAM;  Surgeon: Elam Dutch, MD;  Location: [facility] CATH LAB;  Service: Cardiovascular;  Laterality: N/A;  . SUBCLAVIAN STENT PLACEMENT Left 07/31/2011   7x18 Genesis, balloon, with reduction of 90% ostial left subclavian artery stenosis to 0% with residual excellent flow  . TONSILLECTOMY    . TUBAL LIGATION      OB History    No  data available       Home Medications    Prior to Admission medications   Medication Sig Start Date End Date Taking? Authorizing Provider  ACCU-CHEK FASTCLIX LANCETS MISC USE   TO CHECK GLUCOSE ONCE DAILY 02/18/17   Laurey Morale, MD  ACCU-CHEK SMARTVIEW test strip CHECK BLOOD SUGAR ONCE DAILY 02/18/17   Laurey Morale, MD  acetaminophen (TYLENOL) 325 MG tablet Take 325 mg by mouth every 6 (six) hours as needed for mild pain.    [provider]  albuterol (PROVENTIL) (2.5 MG/3ML) 0.083% nebulizer solution Take 3 mLs (2.5 mg total) by nebulization every 4 (four) hours as needed for wheezing or shortness of breath. 02/18/17   Laurey Morale, MD  allopurinol (ZYLOPRIM) 300 MG tablet TAKE ONE TABLET BY MOUTH ONCE DAILY Patient taking differently: TAKE 300 mg BY MOUTH ONCE DAILY 11/14/16   Laurey Morale, MD  ALPRAZolam Duanne Moron) 0.5 MG tablet Take 1 tablet (0.5 mg total) by mouth at bedtime as needed for anxiety. 07/02/17   Laurey Morale, MD  aspirin 81 MG chewable tablet Chew 1 tablet (81 mg total) by mouth daily. 05/23/16   Reyne Dumas, MD  atorvastatin (LIPITOR) 40 MG tablet Take 1 tablet (40 mg total) by mouth daily at 6 PM. 07/02/17   Laurey Morale, MD  bisoprolol (ZEBETA) 5 MG tablet Take 1 tablet (5 mg total) by mouth daily. 07/02/17   Laurey Morale, MD  clopidogrel (PLAVIX) 75 MG tablet Take 1 tablet (75 mg total) by mouth daily. 07/02/17   Laurey Morale, MD  DULoxetine (CYMBALTA) 60 MG capsule TAKE ONE CAPSULE BY MOUTH TWICE DAILY Patient taking differently: TAKE 60 mg  BY MOUTH TWICE DAILY 03/03/17   Laurey Morale, MD  furosemide (LASIX) 80 MG tablet Take 80mg  in AM, 40mg  in PM. 07/02/17   Laurey Morale, MD  HYDROcodone-acetaminophen (NORCO) 10-325 MG tablet Take 1 tablet by mouth every 6 (six) hours as needed for severe pain. 07/02/17   Laurey Morale, MD  Magnesium Oxide 400 MG CAPS Take 1 capsule (400 mg total) by mouth 2 (two) times daily. 10/30/16   Croitoru, Mihai, MD    nitroGLYCERIN (NITROSTAT) 0.4 MG SL tablet Place 0.4 mg under the tongue every 5 (five) minutes as needed for chest pain. X 3 doses    [provider]  OXYGEN Inhale 3.5 L into the lungs at bedtime.    [provider]  potassium chloride SA (K-DUR,KLOR-CON) 20 MEQ tablet Take 2 tablets (40 mEq total) by mouth 2 (two) times daily. 07/16/17   Laurey Morale, MD  PROAIR HFA [phone] Base) MCG/ACT inhaler  Inhale 2 puffs into the lungs every 6 (six) hours as needed for wheezing or shortness of breath.  02/17/17   [provider]  traZODone (DESYREL) 50 MG tablet Take 1 tablet (50 mg total) by mouth at bedtime. Patient taking differently: Take 50 mg by mouth at bedtime as needed for sleep.  07/02/17   Laurey Morale, MD    Family History Family History  Problem Relation Age of Onset  . CAD Father   . Heart disease Father   . Hyperlipidemia Father   . Heart disease Mother   . Deep vein thrombosis Son   . Hyperlipidemia Unknown   . Colon cancer Maternal Grandmother   . Cancer Sister        ovarian  . Diabetes Sister   . Heart disease Sister   . Anesthesia problems Neg Hx   . Hypotension Neg Hx   . Malignant hyperthermia Neg Hx   . Pseudochol deficiency Neg Hx     Social History Social History   Tobacco Use  . Smoking status: Current Every Day Smoker    Packs/day: 0.50    Years: 62.00    Pack years: 31.00    Types: Cigarettes  . Smokeless tobacco: Never Used  Substance Use Topics  . Alcohol use: No    Alcohol/week: 0.0 oz  . Drug use: No     Allergies   Potassium-containing compounds; Neomycin-polymyxin b gu; Azithromycin; Codeine; Darvon; Erythromycin; Meloxicam; Norco [hydrocodone-acetaminophen]; Penicillins; Propoxyphene n-acetaminophen; Rofecoxib; Rosuvastatin; Statins; and Sulfa antibiotics   Review of Systems Review of Systems  Unable to perform ROS: Acuity of condition  Constitutional: Positive for diaphoresis. Negative for fever.  HENT:  Negative for facial swelling.   Respiratory: Positive for shortness of breath and wheezing.   Gastrointestinal: Negative for abdominal pain.  Musculoskeletal: Negative for gait problem.  Neurological: Negative for facial asymmetry.     Physical Exam Updated Vital Signs BP (!) 150/73   Pulse (!) 109   Temp 99.2 F (37.3 C) (Temporal)   Resp (!) 34   Ht 5\' 5"  (1.651 m)   Wt 83.5 kg (184 lb)   SpO2 96%   BMI 30.62 kg/m   Physical Exam  Constitutional: She appears well-developed and well-nourished. She appears distressed.  HENT:  Head: Normocephalic and atraumatic.  Mouth/Throat: No oropharyngeal exudate.  Eyes: Conjunctivae are normal. Pupils are equal, round, and reactive to light.  Neck: Normal range of motion. Neck supple. No tracheal deviation present.  Cardiovascular: Normal rate, regular rhythm, normal heart sounds and intact distal pulses.  Pulmonary/Chest: She is in respiratory distress. She has wheezes. She has rales.  Abdominal: Soft. Bowel sounds are normal. She exhibits no mass. There is no tenderness. There is no rebound and no guarding.  Musculoskeletal: Normal range of motion.  Neurological: She is alert.  Skin: Skin is warm. Capillary refill takes less than 2 seconds. She is diaphoretic.  Psychiatric: She has a normal mood and affect.     ED Treatments / Results  Labs (all labs ordered are listed, but only abnormal results are displayed)  Results for orders placed or performed during the hospital encounter of 07/19/17  CBC with Differential/Platelet  Result Value Ref Range   WBC 16.3 (H) 4.0 - 10.5 K/uL   RBC 4.40 3.87 - 5.11 MIL/uL   Hemoglobin 13.8 12.0 - 15.0 g/dL   HCT 42.5 36.0 - 46.0 %   MCV 96.6 78.0 - 100.0 fL   MCH 31.4 26.0 - 34.0 pg  MCHC 32.5 30.0 - 36.0 g/dL   RDW 16.7 (H) 11.5 - 15.5 %   Platelets 383 150 - 400 K/uL   Neutrophils Relative % 87 %   Neutro Abs 14.1 (H) 1.7 - 7.7 K/uL   Lymphocytes Relative 7 %   Lymphs Abs 1.2 0.7 -  4.0 K/uL   Monocytes Relative 5 %   Monocytes Absolute 0.9 0.1 - 1.0 K/uL   Eosinophils Relative 1 %   Eosinophils Absolute 0.2 0.0 - 0.7 K/uL   Basophils Relative 0 %   Basophils Absolute 0.0 0.0 - 0.1 K/uL  I-stat troponin, ED  Result Value Ref Range   Troponin i, poc 0.03 0.00 - 0.08 ng/mL   Comment 3          I-Stat arterial blood gas, ED  Result Value Ref Range   pH, Arterial 7.392 7.350 - 7.450   pCO2 arterial 44.6 32.0 - 48.0 mmHg   pO2, Arterial 91.0 83.0 - 108.0 mmHg   Bicarbonate 27.1 20.0 - 28.0 mmol/L   TCO2 28 22 - 32 mmol/L   O2 Saturation 97.0 %   Acid-Base Excess 2.0 0.0 - 2.0 mmol/L   Patient temperature 98.6 F    Collection site BRACHIAL ARTERY    Drawn by Operator    Sample type ARTERIAL    Dg Chest Port 1 View  Result Date: 07/18/2017 CLINICAL DATA:  Cough and shortness of breath for 2 weeks. Wheezing. EXAM: PORTABLE CHEST 1 VIEW COMPARISON:  02/17/2017 FINDINGS: Cardiac pacemaker. Cardiac enlargement with pulmonary vascular congestion. Perihilar and basilar interstitial disease consistent with edema. Small bilateral pleural effusions. No pneumothorax. Calcification of the aorta. IMPRESSION: Cardiac enlargement with pulmonary vascular congestion, bilateral pulmonary interstitial edema, and small bilateral pleural effusions. Similar appearance to previous study. Electronically Signed   By: Lucienne Capers M.D.   On: 07/18/2017 04:08    EKG  Radiology Dg Chest Port 1 View  Result Date: 07/18/2017 CLINICAL DATA:  Cough and shortness of breath for 2 weeks. Wheezing. EXAM: PORTABLE CHEST 1 VIEW COMPARISON:  02/17/2017 FINDINGS: Cardiac pacemaker. Cardiac enlargement with pulmonary vascular congestion. Perihilar and basilar interstitial disease consistent with edema. Small bilateral pleural effusions. No pneumothorax. Calcification of the aorta. IMPRESSION: Cardiac enlargement with pulmonary vascular congestion, bilateral pulmonary interstitial edema, and small  bilateral pleural effusions. Similar appearance to previous study. Electronically Signed   By: Lucienne Capers M.D.   On: 07/18/2017 04:08    Procedures Procedures (including critical care time)  Medications Ordered in ED Medications  nitroGLYCERIN (NITROGLYN) 2 % ointment 1 inch (1 inch Topical Not Given 07/19/17 0350)  furosemide (LASIX) injection 40 mg (40 mg Intravenous Given 07/19/17 0349)  nitroGLYCERIN (NITROGLYN) 2 % ointment (1 inch  Given 07/19/17 0350)     MDM Number of Diagnoses or Management Options Systolic congestive heart failure, unspecified HF chronicity [facility]):  Critical Care Total time providing critical care: 75-105 minutes (BIPAP initiated by me )  MDM Reviewed: previous chart, nursing note and vitals Reviewed previous: labs, ECG and x-ray Interpretation: labs, ECG and x-ray (Elevated BNP negative troponin pleural effusions on CXR by me ) Total time providing critical care: 75-105 minutes (BIPAP initiated by me ). This excludes time spent performing separately reportable procedures and services. Consults: admitting MD  CRITICAL CARE Performed by: Carlisle Beers Total critical care time: 75 minutes Critical care time was exclusive of separately billable procedures and treating other patients. Critical care was necessary to treat or prevent imminent or life-threatening deterioration. Critical  care was time spent personally by me on the following activities: development of treatment plan with patient and/or surrogate as well as nursing, discussions with consultants, evaluation of patient's response to treatment, examination of patient, obtaining history from patient or surrogate, ordering and performing treatments and interventions, ordering and review of laboratory studies, ordering and review of radiographic studies, pulse oximetry and re-evaluation of patient's condition.  BIPAP initiated by me  Final Clinical Impressions(s) / ED Diagnoses   Final  diagnoses:  Systolic congestive heart failure, unspecified HF chronicity ([place])    Admit to medicine failure of outpatient management.     Susa Bones, MD 07/19/17 [phone]

## 2017-07-19 NOTE — Progress Notes (Signed)
Patient resting comfortably on 4L [place] with no respiratory distress noted. BIPAP is not needed at this time. RT will monitor as needed. 

## 2017-07-19 NOTE — ED Notes (Signed)
Pur Wick placed. 

## 2017-07-19 NOTE — ED Triage Notes (Signed)
Patient and EMS states that she was d/c'd from ER with increased Lasix but has continued SOB and is unable to breathe. Patient refuses steroids and states that he cannot use left arm because of blood clot.

## 2017-07-19 NOTE — ED Notes (Signed)
Paged admitting doc

## 2017-07-19 NOTE — ED Notes (Signed)
Admitting doc responded see orders

## 2017-07-19 NOTE — ED Notes (Signed)
Pt removed from BiPap and placed on 6L O2 via nasal cannula to take PO meds, tolerated well, weaned to 4L and tolerating, RR 26, SpO2 94, lung sounds clear and denies respiratory distress. RT notified.

## 2017-07-19 NOTE — Progress Notes (Signed)
[facility] TEAM 1 - Stepdown/ICU TEAM  TAMORAH HADA  DGU:[id number] DOB: 01-Jan-1936 DOA: 07/19/2017 PCP: Laurey Morale, MD    Brief Narrative:  82 y.o. female with a hx of chronic combined CHF (EF 20-25% July 2018), CAD s/p PCI, AICD/PPM, CKD 3, COPD (2L [place] as needed), DM2, HTN, HLD, and PAD S/P fempop bypass to right leg who presented w/ shortness of breath, orthopnea, and in lower extremity edema building over 1-1/2 weeks.  Patient was evaluated in the ED 2/2, given 80 mg of Lasix IV, and discharged home with recommendations to increase home Lasix dosage.  Upon returning home she reported progressively worsening shortness of breath leading her to summon EMS again.  Subjective: Pt is seen for a f/u visit.  She is very angry that her shirt was cut off in the ED.    Assessment & Plan:  Acute on chronic respiratory failure 2/2 CHF exacerbation > pulmonary edema  Status post AICD/PPM  CAD s/p PCI  Essential hypertension  CKD stage III baseline 1-1.2.  Hyperlipidemia continue atorvastatin  DVT prophylaxis: lovenox  Code Status: FULL CODE Family Communication: spoke w/ son/POA at bedside   Disposition Plan:   Consultants:  none  Procedures: none  Antimicrobials:  none  Objective: Blood pressure (!) 133/55, pulse 86, temperature 99.2 F (37.3 C), temperature source Temporal, resp. rate (!) 27, height 5\' 5"  (1.651 m), weight 83.5 kg (184 lb), SpO2 97 %. No intake or output data in the 24 hours ending 07/19/17 0836 Filed Weights   07/19/17 0333 07/19/17 0342  Weight: 83.5 kg (184 lb) 83.5 kg (184 lb)    Examination: Pt was seen for a f/u visit.    CBC: Recent Labs  Lab 07/18/17 0346 07/19/17 0326 07/19/17 0457  WBC 10.0 16.3*  --   NEUTROABS 7.3 14.1*  --   HGB 12.9 13.8 13.9  HCT 40.8 42.5 41.0  MCV 97.4 96.6  --   PLT 272 383  --    Basic Metabolic Panel: Recent Labs  Lab 07/18/17 0346 07/19/17 0337 07/19/17 0457  NA 142  --  140  K 3.7  --   4.2  CL 103  --  101  CO2 28  --   --   GLUCOSE 153*  --  188*  BUN 26*  --  22*  CREATININE 1.14*  --  1.00  CALCIUM 9.3  --   --   MG  --  2.3  --    GFR: Estimated Creatinine Clearance: 48.7 mL/min (by C-G formula based on SCr of 1 mg/dL).  Liver Function Tests: No results for input(s): AST, ALT, ALKPHOS, BILITOT, PROT, ALBUMIN in the last 168 hours. No results for input(s): LIPASE, AMYLASE in the last 168 hours. No results for input(s): AMMONIA in the last 168 hours.  HbA1C: Hgb A1c MFr Bld  Date/Time Value Ref Range Status  12/18/2016 12:16 AM 6.1 (H) 4.8 - 5.6 % Final    Comment:    (NOTE)         Pre-diabetes: 5.7 - 6.4         Diabetes: >6.4         Glycemic control for adults with diabetes: <7.0   09/25/2016 11:29 AM 7.2 (H) 4.6 - 6.5 % Final    Comment:    Glycemic Control Guidelines for People with Diabetes:Non Diabetic:  <6%Goal of Therapy: <7%Additional Action Suggested:  >8%     Scheduled Meds: . allopurinol  300 mg Oral Daily  .  aspirin  81 mg Oral Daily  . atorvastatin  40 mg Oral q1800  . bisoprolol  5 mg Oral Daily  . clopidogrel  75 mg Oral Daily  . DULoxetine  60 mg Oral BID  . enoxaparin (LOVENOX) injection  40 mg Subcutaneous Daily  . furosemide  60 mg Intravenous BID  . ipratropium-albuterol  3 mL Nebulization QID  . Magnesium Oxide  400 mg Oral BID  . nitroGLYCERIN  1 inch Topical Once  . potassium chloride SA  40 mEq Oral BID  . sodium chloride flush  3 mL Intravenous Q12H     LOS: 0 days   Time spent: No Charge  Cherene Altes, MD [facility] Office  [phone] Pager - Text Page per Amion as per below:  On-Call/Text Page:      [email or web address]      password [email or web address]  If 7PM-7AM, please contact night-coverage [email or web address] Password [facility] 07/19/2017, 8:36 AM

## 2017-07-20 ENCOUNTER — Inpatient Hospital Stay (HOSPITAL_COMMUNITY): Payer: Medicare Other

## 2017-07-20 DIAGNOSIS — I361 Nonrheumatic tricuspid (valve) insufficiency: Secondary | ICD-10-CM

## 2017-07-20 LAB — CBC
HEMATOCRIT: 40.3 % (ref 36.0–46.0)
Hemoglobin: 13 g/dL (ref 12.0–15.0)
MCH: 31.3 pg (ref 26.0–34.0)
MCHC: 32.3 g/dL (ref 30.0–36.0)
MCV: 97.1 fL (ref 78.0–100.0)
PLATELETS: 252 10*3/uL (ref 150–400)
RBC: 4.15 MIL/uL (ref 3.87–5.11)
RDW: 16.7 % — AB (ref 11.5–15.5)
WBC: 9.7 10*3/uL (ref 4.0–10.5)

## 2017-07-20 LAB — BASIC METABOLIC PANEL
Anion gap: 14 (ref 5–15)
BUN: 26 mg/dL — ABNORMAL HIGH (ref 6–20)
CALCIUM: 9.1 mg/dL (ref 8.9–10.3)
CO2: 23 mmol/L (ref 22–32)
Chloride: 100 mmol/L — ABNORMAL LOW (ref 101–111)
Creatinine, Ser: 1.25 mg/dL — ABNORMAL HIGH (ref 0.44–1.00)
GFR, EST AFRICAN AMERICAN: 46 mL/min — AB (ref >60)
GFR, EST NON AFRICAN AMERICAN: 40 mL/min — AB (ref >60)
Glucose, Bld: 120 mg/dL — ABNORMAL HIGH (ref 65–99)
Potassium: 5.4 mmol/L — ABNORMAL HIGH (ref 3.5–5.1)
SODIUM: 137 mmol/L (ref 135–145)

## 2017-07-20 LAB — ECHOCARDIOGRAM COMPLETE
HEIGHTINCHES: 65 in
WEIGHTICAEL: 3075.86 [oz_av]

## 2017-07-20 LAB — MAGNESIUM: MAGNESIUM: 2.5 mg/dL — AB (ref 1.7–2.4)

## 2017-07-20 MED ORDER — FUROSEMIDE 10 MG/ML IJ SOLN
60.0000 mg | Freq: Two times a day (BID) | INTRAMUSCULAR | Status: DC
  Administered 2017-07-20 – 2017-07-21 (×2): 60 mg via INTRAVENOUS
  Filled 2017-07-20 (×2): qty 6

## 2017-07-20 MED ORDER — GUAIFENESIN-DM 100-10 MG/5ML PO SYRP: 5.0000 mL | ORAL_SOLUTION | ORAL | Status: DC | PRN

## 2017-07-20 NOTE — Progress Notes (Signed)
  Echocardiogram 2D Echocardiogram has been performed.  Sydney Phillips 07/20/2017, 10:52 AM

## 2017-07-20 NOTE — Progress Notes (Signed)
[place] TEAM 1 - Stepdown/ICU TEAM  Sydney Phillips  NKN:[id number] DOB: 09-May-1936 DOA: 07/19/2017 PCP: Laurey Morale, MD    Brief Narrative:  82 y.o. female with a hx of chronic combined CHF (EF 20-25% July 2018), CAD s/p PCI, AICD/PPM, CKD 3, COPD (2L [place] as needed), DM2, HTN, HLD, and PAD S/P fempop bypass to right leg who presented w/ shortness of breath, orthopnea, and in lower extremity edema building over 1-1/2 weeks.  Patient was evaluated in the ED 2/2, given 80 mg of Lasix IV, and discharged home with recommendations to increase home Lasix dosage.  Upon returning home she reported progressively worsening shortness of breath leading her to summon EMS again.  Events: 2/3 admit 2/4 TTE - EF 35-40% - mild LVH - diffuse hypokinesis - grade 2 DD - mod calcified AoV - mod Mitral regurg - PAP 48  Subjective: Resting comfortably.  No evidence of acute resp distress.    Assessment & Plan:  Acute on chronic respiratory failure 2/2 acute exacerbation of chronic systolic and diastolic CHF > pulmonary edema EF improved from 20-25 > 35-40% on f/u TTE this admit - baseline wgt appears to be ~81.6kg - net negative ~3L since admit - cont to diurese - ?if pt compliant w/ meds and/or diet at home - wean O2 as able   [facility] Weights   07/19/17 0333 07/19/17 0342 07/20/17 0458  Weight: 83.5 kg (184 lb) 83.5 kg (184 lb) 87.2 kg (192 lb 3.9 oz)    Status post BiV ICD Generator changed 03/04/17  CAD s/p PCI  RCA and CFX PCI with DES Nov 2017  Essential hypertension BP well controlled at this time   CKD stage III baseline 1-1.2 - crt climbing, but still essentially at baseline - follow   Hyperlipidemia continue atorvastatin  DVT prophylaxis: lovenox  Code Status: FULL CODE Family Communication: no family present at time of exam today   Disposition Plan: tele   Consultants:  none  Procedures: none  Antimicrobials:  none  Objective: Blood pressure 123/64, pulse 92, temperature  98.9 F (37.2 C), temperature source Oral, resp. rate (!) 22, height 5\' 5"  (1.651 m), weight 87.2 kg (192 lb 3.9 oz), SpO2 91 %.  Intake/Output Summary (Last 24 hours) at 07/20/2017 1521 Last data filed at 07/20/2017 1204 Gross per 24 hour  Intake 360 ml  Output 2050 ml  Net -1690 ml   Filed Weights   07/19/17 0333 07/19/17 0342 07/20/17 0458  Weight: 83.5 kg (184 lb) 83.5 kg (184 lb) 87.2 kg (192 lb 3.9 oz)    Examination: General: No acute respiratory distress Lungs: Clear to auscultation bilaterally without wheezes or crackles Cardiovascular: Regular rate and rhythm without murmur gallop or rub normal S1 and S2 Abdomen: Nontender, nondistended, soft, bowel sounds positive, no rebound, no ascites, no appreciable mass Extremities: trace B LE edema    CBC: Recent Labs  Lab 07/18/17 0346 07/19/17 0326 07/19/17 0457 07/20/17 0559  WBC 10.0 16.3*  --  9.7  NEUTROABS 7.3 14.1*  --   --   HGB 12.9 13.8 13.9 13.0  HCT 40.8 42.5 41.0 40.3  MCV 97.4 96.6  --  97.1  PLT 272 383  --  379   Basic Metabolic Panel: Recent Labs  Lab 07/18/17 0346 07/19/17 0337 07/19/17 0457 07/20/17 0559  NA 142  --  140 137  K 3.7  --  4.2 5.4*  CL 103  --  101 100*  CO2 28  --   --  23  GLUCOSE 153*  --  188* 120*  BUN 26*  --  22* 26*  CREATININE 1.14*  --  1.00 1.25*  CALCIUM 9.3  --   --  9.1  MG  --  2.3  --  2.5*   GFR: Estimated Creatinine Clearance: 39.8 mL/min (A) (by C-G formula based on SCr of 1.25 mg/dL (H)).  Liver Function Tests: No results for input(s): AST, ALT, ALKPHOS, BILITOT, PROT, ALBUMIN in the last 168 hours. No results for input(s): LIPASE, AMYLASE in the last 168 hours. No results for input(s): AMMONIA in the last 168 hours.  HbA1C: Hgb A1c MFr Bld  Date/Time Value Ref Range Status  12/18/2016 12:16 AM 6.1 (H) 4.8 - 5.6 % Final    Comment:    (NOTE)         Pre-diabetes: 5.7 - 6.4         Diabetes: >6.4         Glycemic control for adults with diabetes:  <7.0   09/25/2016 11:29 AM 7.2 (H) 4.6 - 6.5 % Final    Comment:    Glycemic Control Guidelines for People with Diabetes:Non Diabetic:  <6%Goal of Therapy: <7%Additional Action Suggested:  >8%     Scheduled Meds: . allopurinol  300 mg Oral Daily  . aspirin  81 mg Oral Daily  . atorvastatin  40 mg Oral q1800  . bisoprolol  5 mg Oral Daily  . clopidogrel  75 mg Oral Daily  . DULoxetine  60 mg Oral BID  . enoxaparin (LOVENOX) injection  40 mg Subcutaneous Daily  . furosemide  80 mg Intravenous BID  . magnesium oxide  400 mg Oral BID  . nitroGLYCERIN  1 inch Topical Once  . potassium chloride SA  40 mEq Oral BID     LOS: 1 day   Cherene Altes, MD [facility] Office  [phone] Pager - Text Page per Amion as per below:  On-Call/Text Page:      [email or web address]      password [email or web address]  If 7PM-7AM, please contact night-coverage [email or web address] Password [email or web address] 07/20/2017, 3:21 PM

## 2017-07-20 NOTE — Plan of Care (Signed)
Pt was very agitated at start of shift due to complaints of extreme leg cramps/moaning loudly and trashing about in bed. Pt given scheduled K supplements and Xanax. Pt voiced complete relief of discomfort and has been resting/sleeping. Will continue to monitor. Jessie Foot, RN

## 2017-07-21 ENCOUNTER — Encounter (HOSPITAL_COMMUNITY): Payer: Self-pay | Admitting: General Practice

## 2017-07-21 LAB — BASIC METABOLIC PANEL
ANION GAP: 14 (ref 5–15)
BUN: 31 mg/dL — ABNORMAL HIGH (ref 6–20)
CALCIUM: 9 mg/dL (ref 8.9–10.3)
CHLORIDE: 95 mmol/L — AB (ref 101–111)
CO2: 27 mmol/L (ref 22–32)
Creatinine, Ser: 1.3 mg/dL — ABNORMAL HIGH (ref 0.44–1.00)
GFR calc Af Amer: 44 mL/min — ABNORMAL LOW (ref >60)
GFR calc non Af Amer: 38 mL/min — ABNORMAL LOW (ref >60)
GLUCOSE: 105 mg/dL — AB (ref 65–99)
Potassium: 4.5 mmol/L (ref 3.5–5.1)
Sodium: 136 mmol/L (ref 135–145)

## 2017-07-21 LAB — CBC
HEMATOCRIT: 42.3 % (ref 36.0–46.0)
Hemoglobin: 13.7 g/dL (ref 12.0–15.0)
MCH: 31.4 pg (ref 26.0–34.0)
MCHC: 32.4 g/dL (ref 30.0–36.0)
MCV: 97 fL (ref 78.0–100.0)
Platelets: 281 10*3/uL (ref 150–400)
RBC: 4.36 MIL/uL (ref 3.87–5.11)
RDW: 16.4 % — ABNORMAL HIGH (ref 11.5–15.5)
WBC: 8.5 10*3/uL (ref 4.0–10.5)

## 2017-07-21 MED ORDER — FUROSEMIDE 40 MG PO TABS
40.0000 mg | ORAL_TABLET | Freq: Every day | ORAL | Status: DC
  Administered 2017-07-21 – 2017-07-22 (×2): 40 mg via ORAL
  Filled 2017-07-21 (×2): qty 1

## 2017-07-21 MED ORDER — DM-GUAIFENESIN ER 30-600 MG PO TB12
1.0000 | ORAL_TABLET | Freq: Two times a day (BID) | ORAL | Status: DC
  Administered 2017-07-21 – 2017-07-23 (×4): 1 via ORAL
  Filled 2017-07-21 (×4): qty 1

## 2017-07-21 MED ORDER — FUROSEMIDE 80 MG PO TABS
80.0000 mg | ORAL_TABLET | Freq: Every day | ORAL | Status: DC
  Administered 2017-07-22 – 2017-07-23 (×2): 80 mg via ORAL
  Filled 2017-07-21 (×2): qty 1

## 2017-07-21 NOTE — Evaluation (Addendum)
Occupational Therapy Evaluation Patient Details Name: Sydney Phillips MRN: [id number] DOB: March 11, 1936 Today's Date: 07/21/2017    History of Present Illness  Sydney Phillips is a 82 y.o. female with medical history significant for chronic combined CHF,, CAD s/p PCI, AICD/PPMCKD 3, COPD with chronic hypoxia,, DM type 2, HTN, HLD, and PAD S/P fempop bypass to right leg, with worsening shortness of breath over the last 1-1/2 weeks.  She complains of dyspnea with exertion, wheezes, cough, and orthopnea.  Associated symptoms include lower extremity swelling, abdominal distention, and generalized weakness.   Clinical Impression   PTA, pt was able to complete ADL with modified independence utilizing rollator for functional mobility. On OT entry, PT finishing session. She presents to OT evaluation with diminished activity tolerance for ADL and generalized weakness impacting her ability to participate in ADL and functional mobility at PLOF. She currently requires min guard assist for toilet transfers and LB ADL. After mobility in room and ADL at sink, pt with SpO2 desaturation to 87% on 2L O2 via [place] and improved to 91% with pursed lip breathing techniques. Pt would benefit from continued OT services while admitted to improve independence and safety with ADL and functional mobility prior to D/C home. Recommend home health OT follow-up and 24 hour assistance (initially).     Follow Up Recommendations  Home health OT;Supervision/Assistance - 24 hour    Equipment Recommendations  None recommended by OT    Recommendations for Other Services       Precautions / Restrictions Precautions Precautions: Fall Restrictions Weight Bearing Restrictions: No      Mobility Bed Mobility Overal bed mobility: Needs Assistance Bed Mobility: Supine to Sit           General bed mobility comments: Seated at EOB with PT on my arrival.   Transfers Overall transfer level: Needs assistance   Transfers: Sit  to/from Stand Sit to Stand: Min guard         General transfer comment: Min guard assist and significantly increased effort.     Balance Overall balance assessment: Needs assistance Sitting-balance support: No upper extremity supported;Feet supported Sitting balance-Leahy Scale: Fair     Standing balance support: Bilateral upper extremity supported;No upper extremity supported;During functional activity Standing balance-Leahy Scale: Fair Standing balance comment: Statically able to stand for grooming tasks without UE support.                            ADL either performed or assessed with clinical judgement   ADL Overall ADL's : Needs assistance/impaired Eating/Feeding: Set up;Sitting   Grooming: Supervision/safety;Standing   Upper Body Bathing: Set up;Sitting   Lower Body Bathing: Min guard;Sit to/from stand   Upper Body Dressing : Set up;Sitting   Lower Body Dressing: Min guard;Sit to/from stand   Toilet Transfer: Min guard;Ambulation;RW   Toileting- Water quality scientist and Hygiene: Min guard;Sit to/from stand       Functional mobility during ADLs: Min guard;Rolling walker General ADL Comments: Pt with limited activity tolerance for ADL.      Vision Patient Visual Report: No change from baseline Additional Comments: Will continue assessment.      Perception     Praxis      Pertinent Vitals/Pain Pain Assessment: Faces Faces Pain Scale: Hurts a little bit Pain Location: calves Pain Descriptors / Indicators: Cramping Pain Intervention(s): Monitored during session     Hand Dominance     Extremity/Trunk Assessment Upper Extremity Assessment Upper Extremity  Assessment: Generalized weakness   Lower Extremity Assessment Lower Extremity Assessment: Defer to PT evaluation       Communication Communication Communication: HOH   Cognition Arousal/Alertness: Awake/alert Behavior During Therapy: Restless Overall Cognitive Status: Within  Functional Limits for tasks assessed                                     General Comments       Exercises     Shoulder Instructions      Home Living Family/patient expects to be discharged to:: Private residence Living Arrangements: (son lives with her but works long hours during the day) Available Help at Discharge: Family;Available PRN/intermittently Type of Home: House Home Access: Stairs to enter [employer] of Steps: 2-5 Entrance Stairs-Rails: Right;Left Home Layout: One level     Bathroom Shower/Tub: Teacher, early years/pre: Standard     Home Equipment: Environmental consultant - 4 wheels          Prior Functioning/Environment Level of Independence: Independent with assistive device(s)        Comments: Uses rollator and does all ADL's without assist, does not drive now, but goes with son on errands.        OT Problem List: Decreased strength;Decreased activity tolerance;Impaired balance (sitting and/or standing);Decreased safety awareness;Decreased knowledge of use of DME or AE;Decreased knowledge of precautions;Cardiopulmonary status limiting activity;Impaired UE functional use      OT Treatment/Interventions: Self-care/ADL training;Therapeutic exercise;Energy conservation;DME and/or AE instruction;Therapeutic activities;Patient/family education;Balance training    OT Goals(Current goals can be found in the care plan section) Acute Rehab OT Goals Patient Stated Goal: go home and less cramps OT Goal Formulation: With patient Time For Goal Achievement: 08/04/17 Potential to Achieve Goals: Good  OT Frequency: Min 2X/week   Barriers to D/C:            Co-evaluation              AM-PAC PT "6 Clicks" Daily Activity     Outcome Measure Help from another person eating meals?: A Little Help from another person taking care of personal grooming?: A Little Help from another person toileting, which includes using toliet, bedpan, or  urinal?: A Little Help from another person bathing (including washing, rinsing, drying)?: A Little Help from another person to put on and taking off regular upper body clothing?: A Little Help from another person to put on and taking off regular lower body clothing?: A Little 6 Click Score: 18   End of Session Equipment Utilized During Treatment: Rolling walker;Oxygen Nurse Communication: Other (comment)(nurse tech - needs Pure Whick placed)  Activity Tolerance: Patient tolerated treatment well Patient left: in chair;with chair alarm set  OT Visit Diagnosis: Other abnormalities of gait and mobility (R26.89);Muscle weakness (generalized) (M62.81)                Time: 1118-1135(Dovetailed session with PT; charges reflect) OT Time Calculation (min): 17 min Charges:  OT General Charges $OT Visit: 1 Visit OT Evaluation $OT Eval Moderate Complexity: 1 Mod G-Codes:     Norman Herrlich, MS OTR/L  Pager: Morrison A Maciel Kegg 07/21/2017, 1:22 PM

## 2017-07-21 NOTE — Progress Notes (Signed)
Physical Therapy Evaluation Patient Details Name: Sydney Phillips MRN: [id number] DOB: 1935/12/16 Today's Date: 07/21/2017   History of Present Illness   Sydney Phillips is a 82 y.o. female with medical history significant for chronic combined CHF,, CAD s/p PCI, AICD/PPMCKD 3, COPD with chronic hypoxia,, DM type 2, HTN, HLD, and PAD S/P fempop bypass to right leg, with worsening shortness of breath over the last 1-1/2 weeks.  She complains of dyspnea with exertion, wheezes, cough, and orthopnea.  Associated symptoms include lower extremity swelling, abdominal distention, and generalized weakness.  Clinical Impression  Pt admitted with/for worsening SOB and above stated complications.  Pt needing min to min guard assist as of the initial evaluation.  Pt currently limited functionally due to the problems listed below.  (see problems list.)  Pt will benefit from PT to maximize function and safety to be able to get home safely with available assist.     Follow Up Recommendations Home health PT;Supervision - Intermittent    Equipment Recommendations  None recommended by PT    Recommendations for Other Services       Precautions / Restrictions Precautions Precautions: Fall Restrictions Weight Bearing Restrictions: No      Mobility  Bed Mobility Overal bed mobility: Needs Assistance Bed Mobility: Supine to Sit           General bed mobility comments: heavy use of the rail to pull herself to R sidelying.  truncal support while pt stuggles to push up from sidlying  Transfers Overall transfer level: Needs assistance   Transfers: Sit to/from Stand Sit to Stand: Min guard         General transfer comment: cues for hand placement, pt stuggled from EOB, but attained stand without assist  Ambulation/Gait Ambulation/Gait assistance: Min guard Ambulation Distance (Feet): 80 Feet Assistive device: Rolling walker (2 wheeled) Gait Pattern/deviations: Step-through pattern   Gait  velocity interpretation: Below normal speed for age/gender General Gait Details: generally steady with episode of mild unsteadiness, and questionable use of the RW (which is different than her rollator)  SpO2 on 4L [place] during gait 96% and EHR 88 to low 90's.  At rest 4L [place] 95% at 88 bpm.  Stairs            Wheelchair Mobility    Modified Rankin (Stroke Patients Only)       Balance                                             Pertinent Vitals/Pain Pain Assessment: Faces Faces Pain Scale: Hurts a little bit Pain Location: calves Pain Descriptors / Indicators: Cramping Pain Intervention(s): Monitored during session    Home Living Family/patient expects to be discharged to:: Private residence Living Arrangements: Children(son lives with her, but works long days with pt alone) Available Help at Discharge: Family;Available PRN/intermittently Type of Home: House Home Access: Stairs to enter Entrance Stairs-Rails: Psychiatric nurse of Steps: 2-5 Home Layout: One level Home Equipment: Walker - 4 wheels      Prior Function Level of Independence: Independent with assistive device(s)         Comments: does all ADL's without assist, does not drive now, but goes with son on errands.     Hand Dominance        Extremity/Trunk Assessment   Upper Extremity Assessment Upper Extremity Assessment: Defer to OT evaluation  Lower Extremity Assessment Lower Extremity Assessment: Overall WFL for tasks assessed(general proximal weaknesses)       Communication   Communication: HOH  Cognition Arousal/Alertness: Awake/alert Behavior During Therapy: Restless Overall Cognitive Status: Within Functional Limits for tasks assessed                                        General Comments      Exercises     Assessment/Plan    PT Assessment Patient needs continued PT services  PT Problem List Decreased strength;Decreased  activity tolerance;Decreased balance;Decreased mobility;Cardiopulmonary status limiting activity;Decreased knowledge of use of DME       PT Treatment Interventions DME instruction;Gait training;Stair training;Functional mobility training;Therapeutic activities;Patient/family education    PT Goals (Current goals can be found in the Care Plan section)  Acute Rehab PT Goals Patient Stated Goal: get home. PT Goal Formulation: With patient Time For Goal Achievement: 07/28/17 Potential to Achieve Goals: Good    Frequency Min 3X/week   Barriers to discharge Inaccessible home environment      Co-evaluation               AM-PAC PT "6 Clicks" Daily Activity  Outcome Measure Difficulty turning over in bed (including adjusting bedclothes, sheets and blankets)?: Unable Difficulty moving from lying on back to sitting on the side of the bed? : Unable Difficulty sitting down on and standing up from a chair with arms (e.g., wheelchair, bedside commode, etc,.)?: A Little Help needed moving to and from a bed to chair (including a wheelchair)?: A Little Help needed walking in hospital room?: A Little Help needed climbing 3-5 steps with a railing? : A Little 6 Click Score: 14    End of Session     Patient left: Other (comment)(at sink with OT (hand off)) Nurse Communication: Mobility status PT Visit Diagnosis: Other abnormalities of gait and mobility (R26.89);Muscle weakness (generalized) (M62.81)    Time: 1057-1130 PT Time Calculation (min) (ACUTE ONLY): 33 min   Charges:   PT Evaluation $PT Eval Moderate Complexity: 1 Mod PT Treatments $Gait Training: 8-22 mins   PT G Codes:        08-03-17  Donnella Sham, PT [phone]  (pager)  Tessie Fass Calum Cormier 2017/08/03, 12:28 PM

## 2017-07-21 NOTE — Progress Notes (Signed)
[place] TEAM 1 - Stepdown/ICU TEAM  Sydney Phillips  GYI:[id number] DOB: Jun 25, 1935 DOA: 07/19/2017 PCP: Laurey Morale, MD    Brief Narrative:  82 y.o. female with a hx of chronic combined CHF (EF 20-25% July 2018), CAD s/p PCI, AICD/PPM, CKD 3, COPD (2L [place] as needed), DM2, HTN, HLD, and PAD S/P fempop bypass to right leg who presented w/ shortness of breath, orthopnea, and lower extremity edema building over 1-1/2 weeks.  Patient was evaluated in the ED 2/2, given 80 mg of Lasix IV, and discharged home with recommendations to increase home Lasix dosage.  Upon returning home she reported progressively worsening shortness of breath leading her to summon EMS again.  Events: 2/3 admit 2/4 TTE - EF 35-40% - mild LVH - diffuse hypokinesis - grade 2 DD - mod calcified AoV - mod Mitral regurg - PAP 48  Subjective: Pt c/o congestion, and difficulty expectorating sputum from her throat.  She denies cp, n/v, or abdom pain.  She feels her SOB has signif improved.    Assessment & Plan:  Acute on chronic respiratory failure 2/2 acute exacerbation of chronic systolic and diastolic CHF > pulmonary edema EF improved from 20-25 > 35-40% on f/u TTE this admit - baseline wgt appears to be ~81.6kg - net negative ~3.5L since admit - cont to diurese but transition to home oral lasix dose as crt is climbing - ?if pt compliant w/ meds and/or diet at home - wean O2 as able, toward baseline of 2-3lpm [facility]    Filed Weights   07/19/17 0342 07/20/17 0458 07/21/17 0415  Weight: 83.5 kg (184 lb) 87.2 kg (192 lb 3.9 oz) 82 kg (180 lb 12.8 oz)    Status post BiV ICD Generator changed 03/04/17  CAD s/p PCI  RCA and CFX PCI with DES Nov 2017  Essential hypertension BP well controlled at this time   CKD stage III baseline 1-1.2 - crt now above baseline - wgt approaching her baseline - transition back to home dose lasix and follow crt   Recent Labs  Lab 07/18/17 0346 07/19/17 0457 07/20/17 0559 07/21/17 0435    CREATININE 1.14* 1.00 1.25* 1.30*   Hyperlipidemia continue atorvastatin  DVT prophylaxis: lovenox  Code Status: FULL CODE Family Communication: no family present at time of exam today   Disposition Plan: tele - cont PT/OT - wean O2 as able - follow crt - probable d/c in 24-48hrs   Consultants:  none  Procedures: none  Antimicrobials:  none  Objective: Blood pressure 134/78, pulse 89, temperature 98.4 F (36.9 C), temperature source Oral, resp. rate 20, height 5\' 5"  (1.651 m), weight 82 kg (180 lb 12.8 oz), SpO2 100 %.  Intake/Output Summary (Last 24 hours) at 07/21/2017 1614 Last data filed at 07/21/2017 1436 Gross per 24 hour  Intake 720 ml  Output 1600 ml  Net -880 ml   Filed Weights   07/19/17 0342 07/20/17 0458 07/21/17 0415  Weight: 83.5 kg (184 lb) 87.2 kg (192 lb 3.9 oz) 82 kg (180 lb 12.8 oz)    Examination: General: No acute respiratory distress at rest in bed  Lungs: distant bs th/o all fields - no wheezing  Cardiovascular: Regular rate and rhythm without murmur  Abdomen: Nontender, nondistended, soft, bowel sounds positive, no rebound Extremities: trace B LE edema w/o signif change    CBC: Recent Labs  Lab 07/18/17 0346 07/19/17 0326 07/19/17 0457 07/20/17 0559 07/21/17 1131  WBC 10.0 16.3*  --  9.7 8.5  NEUTROABS 7.3 14.1*  --   --   --   HGB 12.9 13.8 13.9 13.0 13.7  HCT 40.8 42.5 41.0 40.3 42.3  MCV 97.4 96.6  --  97.1 97.0  PLT 272 383  --  252 454   Basic Metabolic Panel: Recent Labs  Lab 07/18/17 0346 07/19/17 0337 07/19/17 0457 07/20/17 0559 07/21/17 0435  NA 142  --  140 137 136  K 3.7  --  4.2 5.4* 4.5  CL 103  --  101 100* 95*  CO2 28  --   --  23 27  GLUCOSE 153*  --  188* 120* 105*  BUN 26*  --  22* 26* 31*  CREATININE 1.14*  --  1.00 1.25* 1.30*  CALCIUM 9.3  --   --  9.1 9.0  MG  --  2.3  --  2.5*  --    GFR: Estimated Creatinine Clearance: 37.1 mL/min (A) (by C-G formula based on SCr of 1.3 mg/dL (H)).  Liver  Function Tests: No results for input(s): AST, ALT, ALKPHOS, BILITOT, PROT, ALBUMIN in the last 168 hours. No results for input(s): LIPASE, AMYLASE in the last 168 hours. No results for input(s): AMMONIA in the last 168 hours.  HbA1C: Hgb A1c MFr Bld  Date/Time Value Ref Range Status  12/18/2016 12:16 AM 6.1 (H) 4.8 - 5.6 % Final    Comment:    (NOTE)         Pre-diabetes: 5.7 - 6.4         Diabetes: >6.4         Glycemic control for adults with diabetes: <7.0   09/25/2016 11:29 AM 7.2 (H) 4.6 - 6.5 % Final    Comment:    Glycemic Control Guidelines for People with Diabetes:Non Diabetic:  <6%Goal of Therapy: <7%Additional Action Suggested:  >8%     Scheduled Meds: . allopurinol  300 mg Oral Daily  . aspirin  81 mg Oral Daily  . atorvastatin  40 mg Oral q1800  . bisoprolol  5 mg Oral Daily  . clopidogrel  75 mg Oral Daily  . DULoxetine  60 mg Oral BID  . enoxaparin (LOVENOX) injection  40 mg Subcutaneous Daily  . furosemide  60 mg Intravenous BID  . magnesium oxide  400 mg Oral BID     LOS: 2 days   Cherene Altes, MD [facility] Office  [phone] Pager - Text Page per Shea Evans as per below:  On-Call/Text Page:      [email or web address]      password [email or web address]  If 7PM-7AM, please contact night-coverage [email or web address] Password [email or web address] 07/21/2017, 4:14 PM

## 2017-07-21 NOTE — Care Management Note (Addendum)
Case Management Note  Patient Details  Name: Sydney Phillips MRN: [id number] Date of Birth: January 26, 1936  Subjective/Objective: Pt presented for Acute on Chronic Respiratory Failure. PTA from home with son. Pt states she uses 02 @ 3 L via [place]- CM did call to verify liter flow and if order is prn vs continuous. PT/OT has been consulted.                   Action/Plan: CM did discuss home needs with patient. Patient states she gets her medications without any problems and she keeps them in a pill box. Pt has DME Cane, RW, BSC and shower bench. CM will continue to f/u for additional needs.   Expected Discharge Date:                  Expected Discharge Plan:  [place]  In-House Referral:  NA  Discharge planning Services  CM Consult  Post Acute Care Choice:   NA Choice offered to:   NA  DME Arranged:   NA DME Agency:   NA  HH Arranged:   Pt Refused [place] Agency:   NA  Status of Service:  Completed.  If discussed at [place] of Stay Meetings, dates discussed:    Additional Comments: 1355 07-23-17 Jacqlyn Krauss, RN,BSN [phone] Pt has 02 @ 2 L with [facility]. [facility] will deliver 2 portable tanks for travel home if needed. Pt did state that concentrator is not working appropriately. CM did relay message to Linna Hoff with [facility] for office to call patient to have a service person check the concentrator. Per son [name] he can provide transportation home today if the plan is for d/c. Staff RN aware of plan of care. No further needs from CM at this time.   1106 07-22-17 Jacqlyn Krauss, RN,BSN [phone]  CM did visit with patient again in regards to [place]. Pt is decining [place] @ this time. CM did make pt aware that if she needs services post dc to contact her PCP. No further needs from CM at this time.    1107 07-21-17 Jacqlyn Krauss, RN,BSN [phone] [facility] did verify that pt has 02 order for 2 L Continuous via [place].  Bethena Roys, RN 07/21/2017,  10:47 AM

## 2017-07-22 DIAGNOSIS — I5043 Acute on chronic combined systolic (congestive) and diastolic (congestive) heart failure: Secondary | ICD-10-CM

## 2017-07-22 DIAGNOSIS — I10 Essential (primary) hypertension: Secondary | ICD-10-CM

## 2017-07-22 DIAGNOSIS — Z9119 Patient's noncompliance with other medical treatment and regimen: Secondary | ICD-10-CM

## 2017-07-22 DIAGNOSIS — J9621 Acute and chronic respiratory failure with hypoxia: Secondary | ICD-10-CM

## 2017-07-22 DIAGNOSIS — E662 Morbid (severe) obesity with alveolar hypoventilation: Secondary | ICD-10-CM

## 2017-07-22 DIAGNOSIS — N183 Chronic kidney disease, stage 3 (moderate): Secondary | ICD-10-CM

## 2017-07-22 DIAGNOSIS — G4733 Obstructive sleep apnea (adult) (pediatric): Secondary | ICD-10-CM

## 2017-07-22 LAB — BASIC METABOLIC PANEL
ANION GAP: 12 (ref 5–15)
BUN: 35 mg/dL — AB (ref 6–20)
CO2: 28 mmol/L (ref 22–32)
Calcium: 8.9 mg/dL (ref 8.9–10.3)
Chloride: 98 mmol/L — ABNORMAL LOW (ref 101–111)
Creatinine, Ser: 1.2 mg/dL — ABNORMAL HIGH (ref 0.44–1.00)
GFR calc Af Amer: 48 mL/min — ABNORMAL LOW (ref >60)
GFR, EST NON AFRICAN AMERICAN: 42 mL/min — AB (ref >60)
GLUCOSE: 101 mg/dL — AB (ref 65–99)
POTASSIUM: 4.4 mmol/L (ref 3.5–5.1)
Sodium: 138 mmol/L (ref 135–145)

## 2017-07-22 MED ORDER — GUAIFENESIN-DM 100-10 MG/5ML PO SYRP
5.0000 mL | ORAL_SOLUTION | ORAL | Status: DC | PRN
  Administered 2017-07-22: 5 mL via ORAL
  Filled 2017-07-22 (×2): qty 5

## 2017-07-22 MED ORDER — ATORVASTATIN CALCIUM 80 MG PO TABS: 80.0000 mg | ORAL_TABLET | Freq: Every day | ORAL | Status: DC

## 2017-07-22 NOTE — Progress Notes (Signed)
   07/22/17 1100  Clinical Encounter Type  Visited With Patient  Visit Type Initial  Referral From Chaplain  Consult/Referral To Chaplain  Spiritual Encounters  Spiritual Needs Emotional  Stress Factors  Patient Stress Factors Exhausted  Family Stress Factors Exhausted    Pt was alone on her bed awake and alert. Pt seemed agitated for many reasons which included her concern for her medication. I looked for the nurse and informed her to check on the Pt. I also provided emotional support through my reflective listening and compassionate presence.  Sydney Phillips a Medical sales representative, [employer]

## 2017-07-22 NOTE — Progress Notes (Signed)
PROGRESS NOTE    Sydney Phillips  ZOX:[id number] DOB: 07/14/1935 DOA: 07/19/2017 PCP: Laurey Morale, MD   Brief Narrative:  82 y.o. WF PMHx Chronic Combined CHF (EF 20-25% July 2018), CAD s/p PCI, AICD/PPM, CKD stage 3, COPD (2L [place] as needed), DM type 2, HTN, HLD, and PAD S/P fempop bypass to right leg   Presented w/ shortness of breath, orthopnea, and lower extremity edema building over 1-1/2 weeks.  Patient was evaluated in the ED 2/2, given 80 mg of Lasix IV, and discharged home with recommendations to increase home Lasix dosage.  Upon returning home she reported progressively worsening shortness of breath leading her to summon EMS again.    Subjective: 2/6 A/O 4 negative CP, positive chronic SOB (on 2 L O2 via [place]), noncompliant with medication, noncompliant with follow-up appointments. Per patient her as well as her 3 sons are disabled therefore unable to make appointments. Unknown base weight      Assessment & Plan:   Principal Problem:   Acute on chronic respiratory failure with hypoxia (HCC) Active Problems:   Biventricular implantable cardioverter-defibrillator in situ   CAD S/P percutaneous coronary angioplasty   CKD (chronic kidney disease), stage III (HCC)   Acute on chronic combined systolic and diastolic CHF (congestive heart failure) (HCC)  Acute on Chronic Respiratory Failure with Hypoxia -Multifactorial decompensated systolic and diastolic CHF, pulmonary edema, noncompliance with medication, OSA/OHS? -Treat underlying problems.   EF improved from 20-25 > 35-40% on f/u TTE this admit - baseline wgt appears to be ~81.6kg - net negative ~3.5L since admit - cont to diurese but transition to home oral lasix dose as crt is climbing - ?if pt compliant w/ meds and/or diet at home - wean O2 as able, toward baseline of 2-3lpm [place]          Filed Weights    07/19/17 0342 07/20/17 0458 07/21/17 0415  Weight: 83.5 kg (184 lb) 87.2 kg (192 lb 3.9 oz) 82 kg (180 lb 12.8 oz)    Acute on Chronic Systolic and Diastolic CHF (base [id number]~78.2 kg) -Strict in and out since admission -3.2 L -Daily weight Filed Weights   07/20/17 0458 07/21/17 0415 07/22/17 0603  Weight: 192 lb 3.9 oz (87.2 kg) 180 lb 12.8 oz (82 kg) 179 lb 9.6 oz (81.5 kg)  -EF improved from 20-25%>> 35-40% on Echocardiogram this admission -Bisoprolol 5 mg daily -Lasix 80 mg Am/ 40 mg Pm -S/P biventricular ICD: Generator change 03/04/2017 - CAD -S/P PCI RCA and CFX artery with DES November 2017    Essential HTN  -see CHF   CKD stage III (baseline Cr1-1.2) Recent Labs  Lab 07/18/17 0346 07/19/17 0457 07/20/17 0559 07/21/17 0435 07/22/17 0543  CREATININE 1.14* 1.00 1.25* 1.30* 1.20*  -At baseline  HLD -Lipid panel not within ADA/AHA guidelines -2/6 increase Lipitor 80 mg daily   Noncompliance -Discussed at length with patient consequences of continued noncompliance to include MI, stroke, DEATH -LCSW consult pending: Help with transportation issues to appointments  OSA/OHS? -CPAP per respiratory -Will require outpatient sleep study -If patient tolerates CPAP consider Trilogy machine prior to discharge    DVT prophylaxis: Lovenox Code Status: Full Family Communication: None Disposition Plan: Discharge 2/7?   Consultants:  none  Procedures/Significant Events:  2/3 admit 2/4 TTE - EF 35-40% - mild LVH - diffuse hypokinesis - grade 2 DD - mod calcified AoV - mod Mitral regurg - PAP 48    I have personally reviewed and interpreted all radiology studies  and my findings are as above.  VENTILATOR SETTINGS:    Cultures   Antimicrobials:    Devices    LINES / TUBES:      Continuous Infusions:   Objective: Vitals:   07/21/17 0415 07/21/17 1436 07/21/17 2213 07/22/17 0603  BP: 108/62 134/78 119/66 120/68  Pulse: 90 89 83   Resp: 20  17 18   Temp: 99.8 F (37.7 C) 98.4 F (36.9 C) 98.1 F (36.7 C) 97.7 F (36.5 C)  TempSrc: Oral Oral Oral Oral  SpO2:  93% 100% 96% 99%  Weight: 180 lb 12.8 oz (82 kg)   179 lb 9.6 oz (81.5 kg)  Height:        Intake/Output Summary (Last 24 hours) at 07/22/2017 0839 Last data filed at 07/22/2017 7616 Gross per 24 hour  Intake 1100 ml  Output 1950 ml  Net -850 ml   Filed Weights   07/20/17 0458 07/21/17 0415 07/22/17 0603  Weight: 192 lb 3.9 oz (87.2 kg) 180 lb 12.8 oz (82 kg) 179 lb 9.6 oz (81.5 kg)    Examination:  General: A/O 4, positive acute on chronic respiratory distress Neck:  Negative scars, masses, torticollis, lymphadenopathy, JVD Lungs: diffuse poor air movement throughout, negative  wheezes or crackles Cardiovascular: Regular rate and rhythm (paced rhythm), without murmur gallop or rub normal S1 and S2 Abdomen: Morbidly obese, negative abdominal pain, nondistended, positive soft, bowel sounds, no rebound, no ascites, no appreciable mass Extremities: No significant cyanosis, clubbing, or edema bilateral lower extremities Skin: Negative rashes, lesions, ulcers Psychiatric:  Negative depression, negative anxiety, negative fatigue, negative mania , extremely poor understanding of current health issues Central nervous system:  Cranial nerves II through XII intact, tongue/uvula midline, all extremities muscle strength 5/5, sensation intact throughout, negative dysarthria, negative expressive aphasia, negative receptive aphasia.  .     Data Reviewed: Care during the described time interval was provided by me .  I have reviewed this patient's available data, including medical history, events of note, physical examination, and all test results as part of my evaluation.   CBC: Recent Labs  Lab 07/18/17 0346 07/19/17 0326 07/19/17 0457 07/20/17 0559 07/21/17 1131  WBC 10.0 16.3*  --  9.7 8.5  NEUTROABS 7.3 14.1*  --   --   --   HGB 12.9 13.8 13.9 13.0 13.7  HCT 40.8 42.5 41.0 40.3 42.3  MCV 97.4 96.6  --  97.1 97.0  PLT 272 383  --  252 073   Basic Metabolic Panel: Recent Labs  Lab  07/18/17 0346 07/19/17 0337 07/19/17 0457 07/20/17 0559 07/21/17 0435 07/22/17 0543  NA 142  --  140 137 136 138  K 3.7  --  4.2 5.4* 4.5 4.4  CL 103  --  101 100* 95* 98*  CO2 28  --   --  23 27 28   GLUCOSE 153*  --  188* 120* 105* 101*  BUN 26*  --  22* 26* 31* 35*  CREATININE 1.14*  --  1.00 1.25* 1.30* 1.20*  CALCIUM 9.3  --   --  9.1 9.0 8.9  MG  --  2.3  --  2.5*  --   --    GFR: Estimated Creatinine Clearance: 40.1 mL/min (A) (by C-G formula based on SCr of 1.2 mg/dL (H)). Liver Function Tests: No results for input(s): AST, ALT, ALKPHOS, BILITOT, PROT, ALBUMIN in the last 168 hours. No results for input(s): LIPASE, AMYLASE in the last 168 hours. No results for input(s):  AMMONIA in the last 168 hours. Coagulation Profile: No results for input(s): INR, PROTIME in the last 168 hours. Cardiac Enzymes: No results for input(s): CKTOTAL, CKMB, CKMBINDEX, TROPONINI in the last 168 hours. BNP (last 3 results) No results for input(s): PROBNP in the last 8760 hours. HbA1C: No results for input(s): HGBA1C in the last 72 hours. CBG: No results for input(s): GLUCAP in the last 168 hours. Lipid Profile: No results for input(s): CHOL, HDL, LDLCALC, TRIG, CHOLHDL, LDLDIRECT in the last 72 hours. Thyroid Function Tests: No results for input(s): TSH, T4TOTAL, FREET4, T3FREE, THYROIDAB in the last 72 hours. Anemia Panel: No results for input(s): VITAMINB12, FOLATE, FERRITIN, TIBC, IRON, RETICCTPCT in the last 72 hours. Urine analysis:    Component Value Date/Time   COLORURINE YELLOW 12/17/2016 1634   APPEARANCEUR CLEAR 12/17/2016 1634   LABSPEC 1.008 12/17/2016 1634   PHURINE 6.0 12/17/2016 1634   GLUCOSEU NEGATIVE 12/17/2016 1634   HGBUR NEGATIVE 12/17/2016 1634   HGBUR negative 01/01/2010 1353   BILIRUBINUR negative 07/02/2017 1025   KETONESUR NEGATIVE 12/17/2016 1634   PROTEINUR negative 07/02/2017 1025   PROTEINUR NEGATIVE 12/17/2016 1634   UROBILINOGEN 0.2 07/02/2017 1025    UROBILINOGEN 1.0 11/03/2014 1442   NITRITE negative 07/02/2017 1025   NITRITE NEGATIVE 12/17/2016 1634   LEUKOCYTESUR Negative 07/02/2017 1025   Sepsis Labs: @LABRCNTIP (procalcitonin:4,lacticidven:4)  ) Recent Results (from the past 240 hour(s))  MRSA PCR Screening     Status: None   Collection Time: 07/19/17  2:52 PM  Result Value Ref Range Status   MRSA by PCR NEGATIVE NEGATIVE Final    Comment:        The GeneXpert MRSA Assay (FDA approved for NASAL specimens only), is one component of a comprehensive MRSA colonization surveillance program. It is not intended to diagnose MRSA infection nor to guide or monitor treatment for MRSA infections. Performed at [facility], [place] [address] [place], [place] [address]          Radiology Studies: No results found.      Scheduled Meds: . allopurinol  300 mg Oral Daily  . aspirin  81 mg Oral Daily  . atorvastatin  40 mg Oral q1800  . bisoprolol  5 mg Oral Daily  . clopidogrel  75 mg Oral Daily  . dextromethorphan-guaiFENesin  1 tablet Oral BID  . DULoxetine  60 mg Oral BID  . enoxaparin (LOVENOX) injection  40 mg Subcutaneous Daily  . furosemide  40 mg Oral Daily  . furosemide  80 mg Oral Daily  . magnesium oxide  400 mg Oral BID   Continuous Infusions:   LOS: 3 days    Time spent: 40 minutes   Napolean Sia, Geraldo Docker, MD [facility] Pager [phone]   If 7PM-7AM, please contact night-coverage [email or web address] Password [facility] 07/22/2017, 8:39 AM

## 2017-07-22 NOTE — Progress Notes (Signed)
PT Cancellation Note  Patient Details Name: Sydney Phillips MRN: [id number] DOB: 25-Feb-1936   Cancelled Treatment:    Reason Eval/Treat Not Completed: Patient declined, no reason specified.  Pt very agitated, ranting and refused to mobilize.  "I'm getting the @**@ out of here today, no." 07/22/2017  Donnella Sham, PT [phone]  (pager)   Tessie Fass Cherysh Epperly 07/22/2017, 12:52 PM

## 2017-07-23 DIAGNOSIS — E7849 Other hyperlipidemia: Secondary | ICD-10-CM

## 2017-07-23 LAB — BASIC METABOLIC PANEL
Anion gap: 13 (ref 5–15)
BUN: 34 mg/dL — AB (ref 6–20)
CALCIUM: 8.8 mg/dL — AB (ref 8.9–10.3)
CO2: 28 mmol/L (ref 22–32)
CREATININE: 1.17 mg/dL — AB (ref 0.44–1.00)
Chloride: 97 mmol/L — ABNORMAL LOW (ref 101–111)
GFR calc Af Amer: 50 mL/min — ABNORMAL LOW (ref >60)
GFR, EST NON AFRICAN AMERICAN: 43 mL/min — AB (ref >60)
GLUCOSE: 92 mg/dL (ref 65–99)
Potassium: 3.8 mmol/L (ref 3.5–5.1)
Sodium: 138 mmol/L (ref 135–145)

## 2017-07-23 LAB — MAGNESIUM: Magnesium: 2.3 mg/dL (ref 1.7–2.4)

## 2017-07-23 MED ORDER — ATORVASTATIN CALCIUM 80 MG PO TABS: 80.0000 mg | ORAL_TABLET | Freq: Every day | ORAL | 0 refills | Status: DC

## 2017-07-23 MED ORDER — ALLOPURINOL 300 MG PO TABS: 300.0000 mg | ORAL_TABLET | Freq: Every day | ORAL | 0 refills | Status: DC

## 2017-07-23 NOTE — Discharge Summary (Signed)
Physician Discharge Summary  Sydney Phillips HYQ:[id number] DOB: Jun 26, 1935 DOA: 07/19/2017  PCP: Laurey Morale, MD  Admit date: 07/19/2017 Discharge date: 07/23/2017  Time spent: 35 minutes  Recommendations for Outpatient Follow-up:  Acute on Chronic Respiratory Failure with Hypoxia -Multifactorial decompensated systolic and diastolic CHF, pulmonary edema, noncompliance with medication, OSA/OHS? -Treat underlying problems. -Schedule follow-up appointment in 1-2 weeks with Dr. Alysia Penna acute on chronic respiratory failure with hypoxia, noncompliance medication, OSA/OHS? Patient requires referral for sleep study SATURATION QUALIFICATIONS: (This note is used to comply with regulatory documentation for home oxygen) Patient Saturations on Room Air at Rest = 90-91% Patient Saturations on Room Air while Ambulating = 86-88% Patient Saturations on 3 Liters of oxygen while Ambulating = 94-97% Please briefly explain why patient needs home oxygen: Patient walked with RN and tech utilizing walker. Walked about 262ft. Patient had to take a few rest breaks to maintain O2 sats in mid 90s with 3L O2.       Acute on Chronic Systolic and Diastolic CHF (base XBMWUX~32.4 kg) -Strict in and out since admission -3.6 L -Daily weight      Filed Weights    07/21/17 0415 07/22/17 0603 07/23/17 0401  Weight: 180 lb 12.8 oz (82 kg) 179 lb 9.6 oz (81.5 kg) 177 lb 3.2 oz (80.4 kg)  -EF improved from 20-25%>> 35-40% on Echocardiogram this admission -Bisoprolol 5 mg daily -Lasix 80 mg Am/ 40 mg Pm -S/P biventricular ICD: Generator change 03/04/2017 -Schedule follow-up in 1 to 2 weeks with Dr. [name] cardiology CHF, essential HTN, noncompliance medication.   CAD -S/P PCI RCA and CFX artery with DES November 2017    Essential HTN  -see CHF   CKD stage III (baseline Cr1-1.2) Recent Labs  Lab 07/18/17 0346 07/19/17 0457 07/20/17 0559 07/21/17 0435 07/22/17 0543 07/23/17 0356  CREATININE 1.14*  1.00 1.25* 1.30* 1.20* 1.17*  -At baseline   HLD -Lipid panel not within ADA/AHA guidelines -Lipitor 80 mg daily   Noncompliance -Discussed at length with patient consequences of continued noncompliance to include MI, stroke, DEATH -LCSW spoke with patient at length and provided patient with resources to help her with transportation to appointments.    OSA/OHS? -CPAP per respiratory -Will require outpatient sleep study. Will be arranged by PCP     Discharge Diagnoses:  Principal Problem:   Acute on chronic respiratory failure with hypoxia (HCC) Active Problems:   Biventricular implantable cardioverter-defibrillator in situ   CAD S/P percutaneous coronary angioplasty   CKD (chronic kidney disease), stage III (HCC)   Acute on chronic combined systolic and diastolic CHF (congestive heart failure) ([place])   Discharge Condition: Stable  Diet recommendation: Heart healthy  Filed Weights   07/21/17 0415 07/22/17 0603 07/23/17 0401  Weight: 180 lb 12.8 oz (82 kg) 179 lb 9.6 oz (81.5 kg) 177 lb 3.2 oz (80.4 kg)    History of present illness:  82 y.o. WF PMHx Chronic Combined CHF (EF 20-25% July 2018), CAD s/p PCI, AICD/PPM, CKD stage 3, COPD (2L [place] as needed), DM type 2, HTN, HLD, and PAD S/P fempop bypass to right leg    Presented w/ shortness of breath, orthopnea, and lower extremity edema building over 1-1/2 weeks.  Patient was evaluated in the ED 2/2, given 80 mg of Lasix IV, and discharged home with recommendations to increase home Lasix dosage.  Upon returning home she reported progressively worsening shortness of breath leading her to summon EMS again.   During his hospitalization patient treated for  acute on chronic respiratory failure with hypoxia secondary to decompensated CHF, pulmonary edema, noncompliance with medication, OSA/OHS. Patient was diuresed 3.2 L, medications were adjusted and patient responded well. Patient was counseled at length concerning her noncompliance  with medication failure to follow-up with cardiology and PCP. Patient was provided additional resources to help with her transportation issues which per patient is why she has not been seeing her physician's as an outpatient. Stable for discharge     Procedures: 2/3 admit 2/4 TTE - EF 35-40% - mild LVH - diffuse hypokinesis - grade 2 DD - mod calcified AoV - mod Mitral regurg - PAP 48   Consultations:   Cultures    Antibiotics Anti-infectives (From admission, onward)   None       Discharge Exam: Vitals:   07/22/17 1956 07/22/17 2345 07/23/17 0401 07/23/17 1343  BP: 128/67  115/69 122/86  Pulse: 89 80 70 86  Resp: 20 17 16    Temp: (!) 97.5 F (36.4 C)  97.7 F (36.5 C) 98 F (36.7 C)  TempSrc: Oral  Oral Oral  SpO2: 99% 97% 97% 98%  Weight:   177 lb 3.2 oz (80.4 kg)   Height:        General: A/O 4, positive acute on chronic respiratory distress Neck:  Negative scars, masses, torticollis, lymphadenopathy, JVD Lungs: diffuse poor air movement throughout, negative  wheezes or crackles Cardiovascular: Regular rate and rhythm (paced rhythm), without murmur gallop or rub normal S1 and S2 Abdomen: Morbidly obese, negative abdominal pain, nondistended, positive soft, bowel sounds, no rebound, no ascites, no appreciable mass   Discharge Instructions   Allergies as of 07/23/2017      Reactions   Potassium-containing Compounds Other (See Comments)   Causes severe constipation   Neomycin-polymyxin B Gu    Swollen throat    Azithromycin Rash   Codeine Itching   Darvon Itching   Erythromycin Rash   Meloxicam Other (See Comments)   Unknown    Norco [hydrocodone-acetaminophen] Itching   Penicillins Rash, Other (See Comments)   Has patient had a PCN reaction causing immediate rash, facial/tongue/throat swelling, SOB or lightheadedness with hypotension: unknown Has patient had a PCN reaction causing severe rash involving mucus membranes or skin necrosis: no Has patient  had a PCN reaction that required hospitalization: unknown Has patient had a PCN reaction occurring within the last 10 years: no If all of the above answers are "NO", then may proceed with Cephalosporin use.   Propoxyphene N-acetaminophen Itching   Rofecoxib Other (See Comments)   Unknown    Rosuvastatin Other (See Comments)   cramps   Statins Itching, Other (See Comments)   Sleeplessness   Sulfa Antibiotics Rash      Medication List    STOP taking these medications   OXYGEN     TAKE these medications   ACCU-CHEK FASTCLIX LANCETS Misc USE   TO CHECK GLUCOSE ONCE DAILY   ACCU-CHEK SMARTVIEW test strip Generic drug:  glucose blood CHECK BLOOD SUGAR ONCE DAILY   acetaminophen 325 MG tablet Commonly known as:  TYLENOL Take 325 mg by mouth every 6 (six) hours as needed for mild pain.   PROAIR HFA 108 (90 Base) MCG/ACT inhaler Generic drug:  albuterol Inhale 2 puffs into the lungs every 6 (six) hours as needed for wheezing or shortness of breath.   albuterol (2.5 MG/3ML) 0.083% nebulizer solution Commonly known as:  PROVENTIL Take 3 mLs (2.5 mg total) by nebulization every 4 (four) hours as needed for  wheezing or shortness of breath.   allopurinol 300 MG tablet Commonly known as:  ZYLOPRIM Take 1 tablet (300 mg total) by mouth daily. What changed:    how much to take  how to take this  when to take this   ALPRAZolam 0.5 MG tablet Commonly known as:  XANAX Take 1 tablet (0.5 mg total) by mouth at bedtime as needed for anxiety.   aspirin 81 MG chewable tablet Chew 1 tablet (81 mg total) by mouth daily.   atorvastatin 80 MG tablet Commonly known as:  LIPITOR Take 1 tablet (80 mg total) by mouth daily at 6 PM. What changed:    medication strength  how much to take   bisoprolol 5 MG tablet Commonly known as:  ZEBETA Take 1 tablet (5 mg total) by mouth daily.   clopidogrel 75 MG tablet Commonly known as:  PLAVIX Take 1 tablet (75 mg total) by mouth daily.    DULoxetine 60 MG capsule Commonly known as:  CYMBALTA TAKE ONE CAPSULE BY MOUTH TWICE DAILY What changed:    how much to take  how to take this  when to take this   furosemide 80 MG tablet Commonly known as:  LASIX Take 80mg  in AM, 40mg  in PM.   HYDROcodone-acetaminophen 10-325 MG tablet Commonly known as:  NORCO Take 1 tablet by mouth every 6 (six) hours as needed for severe pain.   Magnesium Oxide 400 MG Caps Take 1 capsule (400 mg total) by mouth 2 (two) times daily.   nitroGLYCERIN 0.4 MG SL tablet Commonly known as:  NITROSTAT Place 0.4 mg under the tongue every 5 (five) minutes as needed for chest pain. X 3 doses   potassium chloride SA 20 MEQ tablet Commonly known as:  K-DUR,KLOR-CON Take 2 tablets (40 mEq total) by mouth 2 (two) times daily.   traZODone 50 MG tablet Commonly known as:  DESYREL Take 1 tablet (50 mg total) by mouth at bedtime. What changed:    when to take this  reasons to take this            Durable Medical Equipment  (From admission, onward)        Start     Ordered   07/23/17 1447  For home use only DME oxygen  Once    Comments:  SATURATION QUALIFICATIONS: (This note is used to comply with regulatory documentation for home oxygen) Patient Saturations on Room Air at Rest = 90-91% Patient Saturations on Room Air while Ambulating = 86-88% Patient Saturations on 3 Liters of oxygen while Ambulating = 94-97% Please briefly explain why patient needs home oxygen: Patient walked with RN and tech utilizing walker. Walked about 257ft. Patient had to take a few rest breaks to maintain O2 sats in mid 90s with 3L O2  Patient instructed to use 2 L O2 via [place] upon exertion to maintain SPO2 89-93%. Patient instructed to use 1 L O2 via [place] when sedentary to maintain SPO2 89 - 93%. Titrate O2 to maintain SPO2 89 - 93% at all times.  @@@@Provide  Inogen O2 concentrator  Question Answer Comment  Mode or (Route) Nasal cannula   Liters per Minute 2    Frequency Continuous (stationary and portable oxygen unit needed)   Oxygen conserving device Yes   Oxygen delivery system Gas      07/23/17 1448     Allergies  Allergen Reactions  . Potassium-Containing Compounds Other (See Comments)    Causes severe constipation  . Neomycin-Polymyxin B Gu  Swollen throat   . Azithromycin Rash  . Codeine Itching  . Darvon Itching  . Erythromycin Rash  . Meloxicam Other (See Comments)    Unknown    . Norco [Hydrocodone-Acetaminophen] Itching  . Penicillins Rash and Other (See Comments)    Has patient had a PCN reaction causing immediate rash, facial/tongue/throat swelling, SOB or lightheadedness with hypotension: unknown Has patient had a PCN reaction causing severe rash involving mucus membranes or skin necrosis: no Has patient had a PCN reaction that required hospitalization: unknown Has patient had a PCN reaction occurring within the last 10 years: no If all of the above answers are "NO", then may proceed with Cephalosporin use.   Marland Kitchen Propoxyphene N-Acetaminophen Itching  . Rofecoxib Other (See Comments)    Unknown    . Rosuvastatin Other (See Comments)    cramps  . Statins Itching and Other (See Comments)    Sleeplessness  . Sulfa Antibiotics Rash   Follow-up Information    Croitoru, Mihai, MD In 2 weeks.   Specialty:  Cardiology Why:  Schedule follow-up in 1 to 2 weeks with Dr. [name] cardiology CHF, essential HTN, noncompliance medication. Appointment is Tuesday, 08/04/17 at 2:30pm  Contact information: [address] [place] [address] [phone]        Laurey Morale, MD. Schedule an appointment as soon as possible for a visit in 2 week(s).   Specialty:  Family Medicine Why:  Schedule follow-up appointment in 1-2 weeks with Dr. Alysia Penna acute on chronic respiratory failure with hypoxia, noncompliance medication, OSA/OHS? Patient requires referral for sleep study. ( Appointment is Monday,  07/27/17 at 3:15pm) Contact information: [place] [address] [phone]            The results of significant diagnostics from this hospitalization (including imaging, microbiology, ancillary and laboratory) are listed below for reference.    Significant Diagnostic Studies: Dg Chest Portable 1 View  Result Date: 07/19/2017 CLINICAL DATA:  Shortness of breath. History of cardiomyopathy, COPD. EXAM: PORTABLE CHEST 1 VIEW COMPARISON:  Chest radiograph July 18, 2017 FINDINGS: Stable moderate to severe cardiomegaly. Calcified aortic knob. Chronic interstitial changes and increased lung volumes without focal consolidation. Minimal LEFT pleural effusion versus pleural thickening, RIGHT costophrenic angle not imaged. LEFT midlung zone granuloma. Apical pleural thickening. LEFT cardiac defibrillator in situ. Osteopenia. IMPRESSION: Stable cardiomegaly. Chronic interstitial changes seen with COPD and chronic pulmonary edema. Minimal LEFT pleural effusion versus pleural thickening. Electronically Signed   By: Elon Alas M.D.   On: 07/19/2017 04:33   Dg Chest Port 1 View  Result Date: 07/18/2017 CLINICAL DATA:  Cough and shortness of breath for 2 weeks. Wheezing. EXAM: PORTABLE CHEST 1 VIEW COMPARISON:  02/17/2017 FINDINGS: Cardiac pacemaker. Cardiac enlargement with pulmonary vascular congestion. Perihilar and basilar interstitial disease consistent with edema. Small bilateral pleural effusions. No pneumothorax. Calcification of the aorta. IMPRESSION: Cardiac enlargement with pulmonary vascular congestion, bilateral pulmonary interstitial edema, and small bilateral pleural effusions. Similar appearance to previous study. Electronically Signed   By: Lucienne Capers M.D.   On: 07/18/2017 04:08    Microbiology: Recent Results (from the past 240 hour(s))  MRSA PCR Screening     Status: None   Collection Time: 07/19/17  2:52 PM  Result Value Ref Range Status   MRSA by  PCR NEGATIVE NEGATIVE Final    Comment:        The GeneXpert MRSA Assay (FDA approved for NASAL specimens only), is one component of  a comprehensive MRSA colonization surveillance program. It is not intended to diagnose MRSA infection nor to guide or monitor treatment for MRSA infections. Performed at [facility], [place] [address], [place], [place] [address]      Labs: Basic Metabolic Panel: Recent Labs  Lab 07/18/17 [phone] 07/19/17 9323 07/19/17 0457 07/20/17 0559 07/21/17 0435 07/22/17 0543 07/23/17 0356  NA 142  --  140 137 136 138 138  K 3.7  --  4.2 5.4* 4.5 4.4 3.8  CL 103  --  101 100* 95* 98* 97*  CO2 28  --   --  23 27 28 28   GLUCOSE 153*  --  188* 120* 105* 101* 92  BUN 26*  --  22* 26* 31* 35* 34*  CREATININE 1.14*  --  1.00 1.25* 1.30* 1.20* 1.17*  CALCIUM 9.3  --   --  9.1 9.0 8.9 8.8*  MG  --  2.3  --  2.5*  --   --  2.3   Liver Function Tests: No results for input(s): AST, ALT, ALKPHOS, BILITOT, PROT, ALBUMIN in the last 168 hours. No results for input(s): LIPASE, AMYLASE in the last 168 hours. No results for input(s): AMMONIA in the last 168 hours. CBC: Recent Labs  Lab 07/18/17 0346 07/19/17 0326 07/19/17 0457 07/20/17 0559 07/21/17 1131  WBC 10.0 16.3*  --  9.7 8.5  NEUTROABS 7.3 14.1*  --   --   --   HGB 12.9 13.8 13.9 13.0 13.7  HCT 40.8 42.5 41.0 40.3 42.3  MCV 97.4 96.6  --  97.1 97.0  PLT 272 383  --  252 281   Cardiac Enzymes: No results for input(s): CKTOTAL, CKMB, CKMBINDEX, TROPONINI in the last 168 hours. BNP: BNP (last 3 results) Recent Labs    12/17/16 1030 07/18/17 0346 07/19/17 0327  BNP 1,060.6* 721.3* 550.6*    ProBNP (last 3 results) No results for input(s): PROBNP in the last 8760 hours.  CBG: No results for input(s): GLUCAP in the last 168 hours.     Signed:  Dia Crawford, MD [facility] [phone] pager

## 2017-07-23 NOTE — Progress Notes (Signed)
SATURATION QUALIFICATIONS: (This note is used to comply with regulatory documentation for home oxygen)  Patient Saturations on Room Air at Rest = 90-91%  Patient Saturations on Room Air while Ambulating = 86-88%  Patient Saturations on 3 Liters of oxygen while Ambulating = 94-97%  Please briefly explain why patient needs home oxygen: Patient walked with RN and tech utilizing walker. Walked about 228ft. Patient had to take a few rest breaks to maintain O2 sats in mid 90s with 3L O2.

## 2017-07-23 NOTE — Consult Note (Signed)
            [facility] Primary Care Navigator  07/23/2017  Sydney Phillips [birth year] [id number]   Wenttoseepatient at the bedsideto identify possible discharge needs but she was justdischargedper staff report.   PerMD note, patientwas seen and treated for worsening shortness of breath, orthopnea, and lower extremity edema building over 1-1/2 weeks.  Patient was discharged home with home health services today.  Primary care provider's officeis listed asprovidingtransition of care (TOC). Patient hasdischarge instruction to follow-up withprimary care provider within 1-2 weeks (appointment is Monday, 07/27/17 at 3:15pm).  Primary care provider's office called (spoke with [name]) to notify of patient's health issues needing follow-up. Made aware to refer patient to [facility] care management if deemed necessary and appropriate for services.   For additional questions please contact:  Edwena Felty A. Joseangel Nettleton, BSN, RN-BC [facility] PRIMARY CARE Navigator Cell: [phone]

## 2017-07-23 NOTE — Progress Notes (Signed)
Occupational Therapy Treatment Patient Details Name: Sydney Phillips MRN: [id number] DOB: [birth year] Today's Date: 07/23/2017    History of present illness  Sydney Phillips is a 82 y.o. female with medical history significant for chronic combined CHF,, CAD s/p PCI, AICD/PPMCKD 3, COPD with chronic hypoxia,, DM type 2, HTN, HLD, and PAD S/P fempop bypass to right leg, with worsening shortness of breath over the last 1-1/2 weeks.  She complains of dyspnea with exertion, wheezes, cough, and orthopnea.  Associated symptoms include lower extremity swelling, abdominal distention, and generalized weakness.   OT comments  Pt reluctant to get OOB for activity, however agreeable toileting and hygiene tasks. Pt ambulated to bathroom with RW, toileted with sup and washed hands at sink. Pt refused to sit up in recliner for lunch and declined any further activity with OT  Follow Up Recommendations  Home health OT;Supervision/Assistance - 24 hour    Equipment Recommendations  None recommended by OT    Recommendations for Other Services      Precautions / Restrictions Precautions Precautions: Fall Restrictions Weight Bearing Restrictions: No       Mobility Bed Mobility Overal bed mobility: Needs Assistance Bed Mobility: Supine to Sit;Sit to Supine     Supine to sit: Supervision Sit to supine: Supervision      Transfers Overall transfer level: Needs assistance Equipment used: Rolling walker (2 wheeled) Transfers: Sit to/from Stand Sit to Stand: Min guard         General transfer comment: increased time and effort    Balance Overall balance assessment: Needs assistance Sitting-balance support: No upper extremity supported;Feet supported Sitting balance-Leahy Scale: Fair     Standing balance support: Bilateral upper extremity supported;No upper extremity supported;During functional activity Standing balance-Leahy Scale: Fair                             ADL either  performed or assessed with clinical judgement   ADL Overall ADL's : Needs assistance/impaired Eating/Feeding: Set up   Grooming: Supervision/safety;Standing;Wash/dry hands                   Toilet Transfer: Min guard;Ambulation;RW   Toileting- Water quality scientist and Hygiene: Min guard;Sit to/from stand         General ADL Comments: pt refused further OOB activity and refused to sit up in recliner to eat her lunch     Vision Patient Visual Report: No change from baseline     Perception     Praxis      Cognition Arousal/Alertness: Awake/alert Behavior During Therapy: WFL for tasks assessed/performed Overall Cognitive Status: Within Functional Limits for tasks assessed                                          Exercises     Shoulder Instructions       General Comments      Pertinent Vitals/ Pain       Pain Assessment: No/denies pain Pain Intervention(s): Monitored during session  Home Living                                          Prior Functioning/Environment              Frequency  Min 2X/week        Progress Toward Goals  OT Goals(current goals can now be found in the care plan section)  Progress towards OT goals: OT to reassess next treatment  ADL Goals Pt Will Perform Grooming: with supervision;with set-up Pt Will Perform Lower Body Bathing: with supervision;with set-up Pt Will Perform Lower Body Dressing: with supervision;with set-up Pt Will Transfer to Toilet: with supervision;with modified independence Pt Will Perform Toileting - Clothing Manipulation and hygiene: with supervision;with modified independence Pt Will Perform Tub/Shower Transfer: with supervision;with modified independence;ambulating;shower seat;3 in 1  Plan Discharge plan remains appropriate    Co-evaluation                 AM-PAC PT "6 Clicks" Daily Activity     Outcome Measure   Help from another person eating  meals?: None Help from another person taking care of personal grooming?: A Little Help from another person toileting, which includes using toliet, bedpan, or urinal?: A Little Help from another person bathing (including washing, rinsing, drying)?: A Little Help from another person to put on and taking off regular upper body clothing?: A Little Help from another person to put on and taking off regular lower body clothing?: A Little 6 Click Score: 19    End of Session Equipment Utilized During Treatment: Rolling walker;Oxygen  OT Visit Diagnosis: Other abnormalities of gait and mobility (R26.89);Muscle weakness (generalized) (M62.81)   Activity Tolerance Patient tolerated treatment well   Patient Left in bed;with bed alarm set;with call bell/phone within reach   Nurse Communication      Functional Assessment Tool Used: AM-PAC 6 Clicks Daily Activity   Time: 5597-4163 OT Time Calculation (min): 20 min  Charges: OT G-codes **NOT FOR INPATIENT CLASS** Functional Assessment Tool Used: AM-PAC 6 Clicks Daily Activity OT General Charges $OT Visit: 1 Visit OT Treatments $Self Care/Home Management : 8-22 mins     Britt Bottom 07/23/2017, 1:15 PM

## 2017-07-23 NOTE — Progress Notes (Signed)
Patient received discharge information and acknowledged understanding of it. Patient received transportation options. Patient received prescription. RN notified legal guardian that patient is being discharged.  RN spoke with patient's son about importance of going to follow up appts. Patient IV was removed.

## 2017-07-23 NOTE — Clinical Social Work Note (Signed)
Clinical Social Work Assessment  Patient Details  Name: Sydney Phillips MRN: [id number] Date of Birth: 1935/07/02  Date of referral:  07/23/17               Reason for consult:  Transportation                Permission sought to share information with:  Family Supports Permission granted to share information::  Yes, Verbal Permission Granted  Name::     Sydney Phillips  Agency::     Relationship::  son who lives at home   Contact Information:   [phone]   Housing/Transportation Living arrangements for the past 2 months:  Single Family Home Source of Information:  Patient Patient Interpreter Needed:  None Criminal Activity/Legal Involvement Pertinent to Current Situation/Hospitalization:  No - Comment as needed Significant Relationships:  Adult Children Lives with:  Adult Children Do you feel safe going back to the place where you live?  Yes Need for family participation in patient care:  Yes (Comment)(transportation)  Care giving concerns:  Pt lives with adult son who works 9-6:30pm, pt does not drive and therefore has been missing appointments and coming to hospital.    Social Worker assessment / plan:  CSW met with pt at bedside. Pt was accepting and adaptable. Pt lives with her son who works in [place] form 9-6:30 and is unable to assist with transportation. Pt other sons live over an hour away and according to pt are unable to drive pt to appointments. Pt states that she used to be active [place], but after he passed and her health hasnt been good, she has not taken those trips. Pt states she would accept help with transportation resources. Pt accepted packet of information regarding free and reduced fee rides to appointments. Pt states understanding that she would perhaps have to schedule these in advance to be picked up. CSW also tried to motivate pt to speak with her sons, friends, church or other community members that she knows that could  help her with transportation. CSW signing off, please consult with any further needs.  Employment status:  Retired Nurse, adult PT Recommendations:  No Follow Up Information / Referral to community resources:     Patient/Family's Response to care: Pt states understanding of CSW role and resources, pt states understanding of need to make community appointments to prevent frequent emergency admissions.   Patient/Family's Understanding of and Emotional Response to Diagnosis, Current Treatment, and Prognosis: Pt states understanding to diagnosis, current treatment, and prognosis. Pt was accepting after initial introductions, but was very gruff in manner and states that she is frustrated with trying to take care of her health.   Emotional Assessment Appearance:  Appears stated age Attitude/Demeanor/Rapport:  Engaged Affect (typically observed):  Accepting, Adaptable Orientation:  Oriented to Self, Oriented to Place, Oriented to  Time, Oriented to Situation Alcohol / Substance use:    Psych involvement (Current and /or in the community):  No (Comment)  Discharge Needs  Concerns to be addressed:  Discharge Planning Concerns, Care Coordination, Other (Comment Required(transportation ) Readmission within the last 30 days:  Yes Current discharge risk:  Dependent with Mobility Barriers to Discharge:  Continued Medical Work up   [employer], [place] 07/23/2017, 12:10 PM

## 2017-07-23 NOTE — Care Management Important Message (Signed)
Important Message  Patient Details  Name: Sydney Phillips MRN: [id number] Date of Birth: [birth year]   Medicare Important Message Given:  Yes    [name] 07/23/2017, 12:30 PM

## 2017-07-24 ENCOUNTER — Telehealth: Payer: Self-pay | Admitting: Family Medicine

## 2017-07-24 NOTE — Telephone Encounter (Signed)
TCM/hospital follow up

## 2017-07-24 NOTE — Telephone Encounter (Signed)
Left a voice message for pt to return my call.

## 2017-07-27 ENCOUNTER — Ambulatory Visit (INDEPENDENT_AMBULATORY_CARE_PROVIDER_SITE_OTHER): Payer: Medicare Other | Admitting: Family Medicine

## 2017-07-27 ENCOUNTER — Encounter: Payer: Self-pay | Admitting: Family Medicine

## 2017-07-27 VITALS — BP 140/70 | HR 97 | Temp 97.8°F | Wt 180.2 lb

## 2017-07-27 DIAGNOSIS — J9611 Chronic respiratory failure with hypoxia: Secondary | ICD-10-CM | POA: Diagnosis not present

## 2017-07-27 DIAGNOSIS — N183 Chronic kidney disease, stage 3 unspecified: Secondary | ICD-10-CM

## 2017-07-27 DIAGNOSIS — I5043 Acute on chronic combined systolic (congestive) and diastolic (congestive) heart failure: Secondary | ICD-10-CM

## 2017-07-27 DIAGNOSIS — J439 Emphysema, unspecified: Secondary | ICD-10-CM

## 2017-07-27 MED ORDER — IPRATROPIUM-ALBUTEROL 0.5-2.5 (3) MG/3ML IN SOLN
3.0000 mL | Freq: Once | RESPIRATORY_TRACT | Status: AC
  Administered 2017-07-27: 3 mL via RESPIRATORY_TRACT

## 2017-07-27 NOTE — Progress Notes (Signed)
   Subjective:    Patient ID: Sydney Phillips, female    DOB: 28-Feb-1936, 82 y.o.   MRN: [id number]  HPI Here to follow up after a hospital stay from 07-19-17 to 07-23-17 for acute on chronic combined systolic and diastolic CHF. She presented with fluid overload and part of the reason was medication non-compliance. She was aggressively diuresed and she responded well. On ECHO her EF improved from 20-25% to 35-40%. Her potassium at DC was 3.8 and creatinine was 1.17. She was sent home on Lasix 80 mg in the am and 40 mg in the pm, and she maintains that she is taking this as directed. Her leg swelling has reduced dramatically. However she remains SOB and has a deep non-productive cough. No fever. She is using her nebulizer 2-3 times daily, but has not used it yet today.    Review of Systems  Constitutional: Negative.   Respiratory: Positive for cough, chest tightness, shortness of breath and wheezing.   Cardiovascular: Negative.   Gastrointestinal: Negative.   Neurological: Negative.        Objective:   Physical Exam  Constitutional: She is oriented to person, place, and time.  Alert, coughs frequently  Neck: No thyromegaly present.  Cardiovascular: Normal rate, regular rhythm, normal heart sounds and intact distal pulses.  Pulmonary/Chest: Effort normal.  Scattered wheezes and bibasilar rales   Musculoskeletal:  Trace ankle edema   Lymphadenopathy:    She has no cervical adenopathy.  Neurological: She is alert and oriented to person, place, and time.          Assessment & Plan:  She has a combination of COPD and CHF. I asked her to use her nebulizer QID and to increase her Lasix to 80 mg BID. She is scheduled follow up with the Cardiology office on 08-04-17.  Alysia Penna, MD

## 2017-07-27 NOTE — Telephone Encounter (Signed)
Transition Care Management Follow-up Telephone Call  Sydney Phillips HBZ:[id number] DOB: 1935/08/28 DOA: 07/19/2017  PCP: Laurey Morale, MD  Admit date: 07/19/2017 Discharge date: 07/23/2017  Time spent: 35 minutes  Recommendations for Outpatient Follow-up:  Acute on Chronic Respiratory Failure with Hypoxia -Multifactorial decompensated systolic and diastolic CHF, pulmonary edema, noncompliance with medication, OSA/OHS? -Treat underlying problems. -Schedule follow-up appointment in 1-2 weeks with Dr. Rich Reining on chronic respiratory failure with hypoxia, noncompliance medication, OSA/OHS? Patient requires referral for sleep study SATURATION QUALIFICATIONS: (Thisnote is usedto comply with regulatory documentation for home oxygen) Patient Saturations on Room Air at Rest =90-91% Patient Saturations on Room Air while Ambulating =86-88% Patient Saturations on3Liters of oxygen while Ambulating = 94-97% Please briefly explain why patient needs home oxygen: Patient walked with RN and tech utilizing walker. Walked about 229ft. Patient had to take a few rest breaks to maintain O2 sats in mid 90s with 3L O2.    How have you been since you were released from the hospital? "not feeling well"   Do you understand why you were in the hospital? yes   Do you understand the discharge instructions? yes   Where were you discharged to? Home   Items Reviewed:  Medications reviewed: yes  Allergies reviewed: yes  Dietary changes reviewed: yes  Referrals reviewed: yes   Functional Questionnaire:   Activities of Daily Living (ADLs):   She states they are independent in the following: ambulation, bathing and hygiene, feeding, continence, grooming, toileting and dressing States they require assistance with the following: none   Any transportation issues/concerns?: no   Any patient concerns? no   Confirmed importance and date/time of follow-up visits scheduled  yes  Provider Appointment booked with Dr. Sarajane Jews on 07/27/2017 Monday  Confirmed with patient if condition begins to worsen call PCP or go to the ER.  Patient was given the office number and encouraged to call back with question or concerns.  : yes

## 2017-07-28 ENCOUNTER — Telehealth: Payer: Self-pay | Admitting: Cardiology

## 2017-07-28 DIAGNOSIS — J449 Chronic obstructive pulmonary disease, unspecified: Secondary | ICD-10-CM | POA: Diagnosis not present

## 2017-07-29 ENCOUNTER — Telehealth: Payer: Self-pay

## 2017-07-29 NOTE — Telephone Encounter (Signed)
[facility] fax [place]  Medication Furosemide 80 MG    disp amount should be 270 for pt's Rx for furisemide was sent for sip amount of 135   Directions should be 2 QD AM and 1 QD PM  Pt stated there needs to be a change in the directions

## 2017-07-29 NOTE — Telephone Encounter (Signed)
Sent to PCP for clarification on directions

## 2017-07-30 ENCOUNTER — Other Ambulatory Visit (INDEPENDENT_AMBULATORY_CARE_PROVIDER_SITE_OTHER): Payer: Medicare Other

## 2017-07-30 DIAGNOSIS — N183 Chronic kidney disease, stage 3 unspecified: Secondary | ICD-10-CM

## 2017-07-30 DIAGNOSIS — R6889 Other general symptoms and signs: Secondary | ICD-10-CM | POA: Diagnosis not present

## 2017-07-30 LAB — BASIC METABOLIC PANEL
BUN: 40 mg/dL — ABNORMAL HIGH (ref 6–23)
CALCIUM: 9.9 mg/dL (ref 8.4–10.5)
CO2: 31 meq/L (ref 19–32)
CREATININE: 1.55 mg/dL — AB (ref 0.40–1.20)
Chloride: 95 mEq/L — ABNORMAL LOW (ref 96–112)
GFR: 34.24 mL/min — AB (ref >60.00)
Glucose, Bld: 196 mg/dL — ABNORMAL HIGH (ref 70–99)
Potassium: 5.1 mEq/L (ref 3.5–5.1)
SODIUM: 135 meq/L (ref 135–145)

## 2017-07-30 MED ORDER — FUROSEMIDE 80 MG PO TABS: ORAL_TABLET | ORAL | 3 refills | Status: DC

## 2017-07-30 NOTE — Telephone Encounter (Signed)
Done

## 2017-07-31 ENCOUNTER — Encounter: Payer: Self-pay | Admitting: Family Medicine

## 2017-08-03 ENCOUNTER — Telehealth: Payer: Self-pay | Admitting: Family Medicine

## 2017-08-03 NOTE — Telephone Encounter (Signed)
Sir please see below message, please advise.

## 2017-08-03 NOTE — Telephone Encounter (Signed)
Copied from [place] [id number]. Topic: Quick Communication - Rx Refill/Question >> Aug 03, 2017  2:48 PM Scherrie Gerlach wrote: Medication: ALPRAZolam Duanne Moron) 1 MG tablet  Pt states Dr fry decreased her to the  ALPRAZolam Duanne Moron) 0.5 MG tablet  Pt states she is not sleeping and wants Dr Sarajane Jews to call in new Rx for increase back to the [place], [place] [phone] (Phone) [phone] (Fax)

## 2017-08-04 ENCOUNTER — Encounter: Payer: Self-pay | Admitting: Cardiology

## 2017-08-04 ENCOUNTER — Ambulatory Visit: Payer: Medicare Other | Admitting: Cardiology

## 2017-08-04 DIAGNOSIS — N183 Chronic kidney disease, stage 3 unspecified: Secondary | ICD-10-CM

## 2017-08-04 DIAGNOSIS — I739 Peripheral vascular disease, unspecified: Secondary | ICD-10-CM

## 2017-08-04 DIAGNOSIS — J439 Emphysema, unspecified: Secondary | ICD-10-CM | POA: Diagnosis not present

## 2017-08-04 DIAGNOSIS — I255 Ischemic cardiomyopathy: Secondary | ICD-10-CM | POA: Diagnosis not present

## 2017-08-04 DIAGNOSIS — I5043 Acute on chronic combined systolic (congestive) and diastolic (congestive) heart failure: Secondary | ICD-10-CM

## 2017-08-04 DIAGNOSIS — E118 Type 2 diabetes mellitus with unspecified complications: Secondary | ICD-10-CM

## 2017-08-04 MED ORDER — ALPRAZOLAM 1 MG PO TABS: 1.0000 mg | ORAL_TABLET | Freq: Every day | ORAL | 5 refills | Status: DC

## 2017-08-04 NOTE — Assessment & Plan Note (Signed)
Severe COPD, home O2 at night- still smoking

## 2017-08-04 NOTE — Assessment & Plan Note (Signed)
Last SCr 1.55

## 2017-08-04 NOTE — Assessment & Plan Note (Signed)
Type 2 NIDDM- diet

## 2017-08-04 NOTE — Assessment & Plan Note (Signed)
Seen today as a post hospital visit from 07/20/27-07/24/27

## 2017-08-04 NOTE — Telephone Encounter (Signed)
Called and spoke with pt to make her aware that this was done. Pt voiced understanding.

## 2017-08-04 NOTE — Telephone Encounter (Signed)
Stop the 0.5 mg and increase Xanax to 1 mg to take qhs. Call in #30 with 5 rf

## 2017-08-04 NOTE — Telephone Encounter (Signed)
Called in Rx for pt. Removed Xanax 0.5 MG from pt's medication list and advised pharmacy to disregard due to dose change.

## 2017-08-04 NOTE — Progress Notes (Signed)
08/04/2017 Sydney Phillips   [birth year]  [id number]  Primary Physician Laurey Morale, MD Primary Cardiologist: Dr Sallyanne Kuster  HPI: 82 y/o female patient with multiple medical problems followed by Dr Sallyanne Kuster. She was hospitalized in Nov 3710 with an MI complicated by respiratory failure and acute on chronic renal injury. She received an RCA and CFX DES. She has know mixed cardiomyopathy and had a BiV ICD placed in 2009. She was a responder, her EF was 55% in 2015, but after her MI in Nov 2017 her EF dropped to 20-25%. She had a BiV ICD Gen change 03/04/17 secondary to EOL. She is alone during the day, her son is there at night. She uses O2 at night, still smokes.   She was seen in the office Oct 2018 and was doing pretty well. She then presented 07/19/17 with increasing dyspnea over two weeks. She was given IV Lasix in the ED (refused IV steroids secondary to past reaction). She went home from the ED but called EMS later for increased SOB and was admitted. Her admission wgt was 184 lbs- DC wgt 177 lbs. An echo was done that showed her EF to be 35 to 40% with grade 2 DD, moderate MR, moderate AOV Ca++, and pulmonary HTN with a PA pressure of 48 mmHg. We did not see her during that admission.  She is in the offcie today for follow up. She tells me she is doing well from a breathing standpoint. She actually had her remaining teeth pulled this morning (and still kept this office visit!). Her wgt in the office is 183 lbs but she states she does not feel SOB or like she is volume overloaded.    Current Outpatient Medications  Medication Sig Dispense Refill  . ACCU-CHEK FASTCLIX LANCETS MISC USE   TO CHECK GLUCOSE ONCE DAILY 102 each 1  . ACCU-CHEK SMARTVIEW test strip CHECK BLOOD SUGAR ONCE DAILY 100 each 1  . acetaminophen (TYLENOL) 325 MG tablet Take 325 mg by mouth every 6 (six) hours as needed for mild pain.    Marland Kitchen albuterol (PROVENTIL) (2.5 MG/3ML) 0.083% nebulizer solution Take 3 mLs (2.5 mg  total) by nebulization every 4 (four) hours as needed for wheezing or shortness of breath. 75 mL 12  . allopurinol (ZYLOPRIM) 300 MG tablet Take 1 tablet (300 mg total) by mouth daily. 30 tablet 0  . ALPRAZolam (XANAX) 1 MG tablet Take 1 tablet (1 mg total) by mouth at bedtime. 30 tablet 5  . aspirin 81 MG chewable tablet Chew 1 tablet (81 mg total) by mouth daily. 30 tablet 1  . atorvastatin (LIPITOR) 80 MG tablet Take 1 tablet (80 mg total) by mouth daily at 6 PM. 30 tablet 0  . bisoprolol (ZEBETA) 5 MG tablet Take 1 tablet (5 mg total) by mouth daily. 90 tablet 3  . clopidogrel (PLAVIX) 75 MG tablet Take 1 tablet (75 mg total) by mouth daily. 90 tablet 3  . DULoxetine (CYMBALTA) 60 MG capsule TAKE ONE CAPSULE BY MOUTH TWICE DAILY (Patient taking differently: TAKE 60 mg  BY MOUTH TWICE DAILY) 60 capsule 11  . furosemide (LASIX) 80 MG tablet Take 80mg  in AM, 40mg  in PM. 270 tablet 3  . HYDROcodone-acetaminophen (NORCO) 10-325 MG tablet Take 1 tablet by mouth every 6 (six) hours as needed for severe pain. 120 tablet 0  . Magnesium Oxide 400 MG CAPS Take 1 capsule (400 mg total) by mouth 2 (two) times daily. 180 capsule 3  .  nitroGLYCERIN (NITROSTAT) 0.4 MG SL tablet Place 0.4 mg under the tongue every 5 (five) minutes as needed for chest pain. X 3 doses    . potassium chloride SA (K-DUR,KLOR-CON) 20 MEQ tablet Take 2 tablets (40 mEq total) by mouth 2 (two) times daily. 360 tablet 3  . PROAIR HFA 108 (90 Base) MCG/ACT inhaler Inhale 2 puffs into the lungs every 6 (six) hours as needed for wheezing or shortness of breath.     . traZODone (DESYREL) 50 MG tablet Take 1 tablet (50 mg total) by mouth at bedtime. (Patient taking differently: Take 50 mg by mouth at bedtime as needed for sleep. ) 90 tablet 3   No current facility-administered medications for this visit.     Allergies  Allergen Reactions  . Potassium-Containing Compounds Other (See Comments)    Causes severe constipation  .  Neomycin-Polymyxin B Gu     Swollen throat   . Azithromycin Rash  . Codeine Itching  . Darvon Itching  . Erythromycin Rash  . Meloxicam Other (See Comments)    Unknown    . Norco [Hydrocodone-Acetaminophen] Itching  . Penicillins Rash and Other (See Comments)    Has patient had a PCN reaction causing immediate rash, facial/tongue/throat swelling, SOB or lightheadedness with hypotension: unknown Has patient had a PCN reaction causing severe rash involving mucus membranes or skin necrosis: no Has patient had a PCN reaction that required hospitalization: unknown Has patient had a PCN reaction occurring within the last 10 years: no If all of the above answers are "NO", then may proceed with Cephalosporin use.   Marland Kitchen Propoxyphene N-Acetaminophen Itching  . Rofecoxib Other (See Comments)    Unknown    . Rosuvastatin Other (See Comments)    cramps  . Statins Itching and Other (See Comments)    Sleeplessness  . Sulfa Antibiotics Rash    Past Medical History:  Diagnosis Date  . Anxiety   . Arthritis   . Asthma   . Automatic implantable cardioverter-defibrillator in situ   . Bronchitis   . CAD (coronary artery disease)    a. s/p DES to LCx/RCA 05/2016, ostial LAD disease.  . Cardiomyopathy ([place])   . Chronic back pain   . Chronic combined systolic and diastolic CHF (congestive heart failure) ([place])   . Chronic constipation   . Chronic pain   . CKD (chronic kidney disease), stage III ([place])   . Colon polyps 2003.  2015.   HP polyps 2003.  adnomas 2015.  required referal to [facility] for colonoscopic resection of flat polyps.   Marland Kitchen COPD (chronic obstructive pulmonary disease) ([place])   . Dementia   . Depression   . Depression with anxiety    takes Cymbalta daily  . Diabetes mellitus ([place])   . Dyspnea   . Early cataracts, bilateral   . Fibromyalgia   . GERD (gastroesophageal reflux disease)    was on meds but was taken off;now watches what she eats  . Hemorrhoids   . History of kidney  stones   . Hx of colonic polyps   . Hyperlipemia    takes Crestor daily  . Hypertension    takes Amlodipine and Metoprolol daily  . Insomnia   . LBBB (left bundle branch block)    Stress test 09/03/2010, EF 55  . Myocardial infarction ([place])   . PAD (peripheral artery disease) ([facility])    Carotid, subclavian, and lower extremity beds, currently not symptomatic  . Presence of combination internal cardiac defibrillator (ICD) and  pacemaker   . Presence of permanent cardiac pacemaker   . Pulmonary hypertension ([place])   . S/P angioplasty with stent, lt. subclavian 07/31/11 08/01/2011  . Subclavian arterial stenosis, lt, with PTA/STENT 07/31/11 08/01/2011  . Syncope 07/28/2011   EF - 50-55, moderate concentric hypertrophy in left ventricle  . Urinary incontinence   . Vertigo    takes Meclizine prn    Social History   Socioeconomic History  . Marital status: Widowed    Spouse name: Not on file  . Number of children: 3  . Years of education: 69  . Highest education level: Not on file  Social Needs  . Financial resource strain: Not on file  . Food insecurity - worry: Not on file  . Food insecurity - inability: Not on file  . Transportation needs - medical: Not on file  . Transportation needs - non-medical: Not on file  Occupational History  . Occupation: Retired  Tobacco Use  . Smoking status: Former Smoker    Packs/day: 0.50    Years: 62.00    Pack years: 31.00    Types: Cigarettes    Last attempt to quit: 07/19/2017    Years since quitting: 0.0  . Smokeless tobacco: Never Used  Substance and Sexual Activity  . Alcohol use: No    Alcohol/week: 0.0 oz  . Drug use: No  . Sexual activity: No    Birth control/protection: Surgical  Other Topics Concern  . Not on file  Social History Narrative   Ok to share information with medical POA, Son Gabriel Carina   Right-handed   Caffeine: Pepsi     Family History  Problem Relation Age of Onset  . CAD Father   . Heart disease Father   .  Hyperlipidemia Father   . Heart disease Mother   . Deep vein thrombosis Son   . Hyperlipidemia Unknown   . Colon cancer Maternal Grandmother   . Cancer Sister        ovarian  . Diabetes Sister   . Heart disease Sister   . Anesthesia problems Neg Hx   . Hypotension Neg Hx   . Malignant hyperthermia Neg Hx   . Pseudochol deficiency Neg Hx      Review of Systems: General: negative for chills, fever, night sweats or weight changes.  Cardiovascular: negative for chest pain, dyspnea on exertion, edema, orthopnea, palpitations, paroxysmal nocturnal dyspnea or shortness of breath Dermatological: negative for rash Respiratory: negative for cough or wheezing Urologic: negative for hematuria Abdominal: negative for nausea, vomiting, diarrhea, bright red blood per rectum, melena, or hematemesis Neurologic: negative for visual changes, syncope, or dizziness All other systems reviewed and are otherwise negative except as noted above.    Blood pressure 117/73, pulse 89, height 5\' 5"  (1.651 m), weight 183 lb (83 kg).  General appearance: alert, cooperative, appears older than stated age, no distress and mildly obese Neck: no carotid bruit and no JVD Lungs: decreased breath sounds c/w COPD Heart: regular rate and rhythm Extremities: extremities normal, atraumatic, no cyanosis or edema Skin: pale cool dry Neurologic: Grossly normal   ASSESSMENT AND PLAN:   Acute on chronic combined CHF Seen today as a post hospital visit from 07/20/27-07/24/27  Bi V in place MDT BiV ICD placed in '09 (initially a responder but recent EF 20-25%). Gen change 03/04/17  COPD Severe COPD, home O2 at night- still smoking  CAD- h/o PCI RCA and CFX PCI with DES Nov 2017  ICM EF 20-25% July 2018 (  pre BiV ICD change out) EF 35-40% Feb 2019   PLAN: Continue current Rx. F/U with Dr Sallyanne Kuster in May. After interviewing her and examining her I'm currently attributing her weight discrepancy to a difference in  scale and how she was weighed.   Kerin Ransom PA-C 08/04/2017 2:54 PM

## 2017-08-04 NOTE — Patient Instructions (Signed)
Sydney Phillips, Sydney Phillips recommends that you schedule a follow-up appointment in mid-May or after with Dr. Sallyanne Kuster

## 2017-08-04 NOTE — Assessment & Plan Note (Addendum)
EF 20-25% July 2018 (pre BiV ICD change out) EF 35-40% Feb 2019

## 2017-08-10 ENCOUNTER — Telehealth: Payer: Self-pay

## 2017-08-10 ENCOUNTER — Telehealth: Payer: Self-pay | Admitting: Family Medicine

## 2017-08-10 ENCOUNTER — Ambulatory Visit (INDEPENDENT_AMBULATORY_CARE_PROVIDER_SITE_OTHER): Payer: Medicare Other | Admitting: *Deleted

## 2017-08-10 DIAGNOSIS — Z95810 Presence of automatic (implantable) cardiac defibrillator: Secondary | ICD-10-CM | POA: Diagnosis not present

## 2017-08-10 DIAGNOSIS — I5042 Chronic combined systolic (congestive) and diastolic (congestive) heart failure: Secondary | ICD-10-CM | POA: Diagnosis not present

## 2017-08-10 DIAGNOSIS — I255 Ischemic cardiomyopathy: Secondary | ICD-10-CM

## 2017-08-10 NOTE — Progress Notes (Signed)
Remote ICD transmission.   

## 2017-08-10 NOTE — Progress Notes (Signed)
EPIC Encounter for ICM Monitoring  Patient Name: Sydney Phillips is a 82 y.o. female Date: 08/10/2017 Primary Care Physican: Laurey Morale, MD Primary Cardiologist:[place] Electrophysiologist: Cro[place]u DryWeight: unknown Bi-V Pacing: 97.9%        Attempted call to patient and unable to reach.  Left detailed message regarding transmission.  Transmission reviewed. Marland Kitchen  Hospitalized 2/3 2/7 with dx of CHF.  Discharge note says she is noncompliant with medications and canceled last follow up on 1/24 with Dr Sallyanne Kuster.   Thoracic impedance is at baseline today but was showing dryness which correlates with patient being diuresed during hospitalization.  Prescribed dosage: Furosemide 80 mg 1 tablet every AM and 0.5 tablet (40 mg total) every PM. Potassium 20 mEq 1 tablet twice a day.  Labs: 04/06/2017 Creatinine 1.01, BUN 14, Potassium 2.7, Sodium 140, EGFR 52-60 02/25/2017 Creatinine 1.14, BUN 18, Potassium 4.7, Sodium 141, EGFR 46-53 02/17/2017 Creatinine 1.15, BUN 9, Potassium 2.8, Sodium 140, EGFR 44-51 01/08/2017 Creatinine 1.28, BUN 20, Potassium 4.9, Sodium 143, EGFR 40-46 01/02/2017 Creatinine 1.61, BUN 48, Potassium 5.5, Sodium 138, EGFR 30-35  12/20/2016 Creatinine 1.42, BUN 33, Potassium 5.0, Sodium 139, EGFR 34-40  12/19/2016 Creatinine 1.06, BUN 24, Potassium 4.7, Sodium 139, EGFR 49-57  12/18/2016 Creatinine 1.32, BUN 20, Potassium 3.4, Sodium 138, EGFR 38-44  12/17/2016 Creatinine 1.09, BUN 9, Potassium 2.4, Sodium 141, EGFR 47-55  09/25/2016 Creatinine 1.09, BUN 34, Potassium 3.9, Sodium 143  09/04/2016 Creatinine 0.98, BUN 13,Potassium 3.3, Sodium 141 08/08/2016 Creatinine 1.05, BUN 11, Potassium 4.1Sodium 144, BNP 540.2 07/09/2016 Creatinine 1.19, BUN 19, Potassium 4.4, Sodium 140 06/30/2016 Creatinine 1.15, BUN 27, Potassium 4.6, Sodium 139, EGFR 44-51 01/14/2018Creatinine 1.12, BUN 26, Potassium 4.3, Sodium 140, EGFR 46-53  01/13/2018Creatinine  1.01, BUN 26, Potassium 3.7, Sodium 141, EGFR 52->60  06/27/2016 Creatinine 1.06, BUN 14, Potassium 3.7, Sodium 140, EGFR 49-57  Recommendations: Left voice mail with ICM number and encouraged to call if experiencing any fluid symptoms.  Follow-up plan: ICM clinic phone appointment on 08/24/2017 to recheck fluid levels.  Office appointment scheduled 11/02/2017 with Dr. Sallyanne Kuster.  Copy of ICM check sent to Dr. Sallyanne Kuster.   3 month ICM trend: 08/10/2017    1 Year ICM trend:       [name], RN 08/10/2017 12:06 PM

## 2017-08-10 NOTE — Telephone Encounter (Signed)
Copied from [place]. Topic: Quick Communication - Rx Refill/Question >> Aug 10, 2017  2:40 PM Margot Ables wrote: Medication: xanax - pt states that the new dose of xanax 1mg  for 30 tabs is $10.42 - she was only paying $2.00 for 120 tabs of the xanax 0.5mg  - can she be changed back and perhaps take 2 tabs at a time to save money? Please advise pt. Has the patient contacted their pharmacy? No. Preferred Pharmacy (with phone number or street name): [place], [place] - [place] [phone] (Phone) [phone] (Fax)

## 2017-08-10 NOTE — Telephone Encounter (Signed)
Remote ICM transmission received.  Attempted call to patient and left detailed message per DPR regarding transmission and next ICM scheduled for 08/24/2017.  Advised to return call for any fluid symptoms or questions.

## 2017-08-11 MED ORDER — ALPRAZOLAM 0.5 MG PO TABS: ORAL_TABLET | ORAL | 4 refills | Status: DC

## 2017-08-11 NOTE — Telephone Encounter (Signed)
Called [facility] to cancel Rx for xanax 1 MG sent 07/29/2017. Rx has been removed and called in new Rx for xanax 0.5 MG take every 6 hours as needed. Spoke with Apolonio Schneiders the pharmacist. They were only able to do 4 refills and not 5.   Called to advise pt. Pt advised and voiced understanding.

## 2017-08-11 NOTE — Telephone Encounter (Signed)
LOV 07/27/17  Dr. [facility] [phone]  Xanax refill request - Pt  States that the new dose of Xanax 1 mg for 30 tabs is $10.42 - she was only paying $2.00 for 120 tabs of Xanax 0.5 mg.    Can she be changed back and perhaps take 2 tabs at a time to save money?   Please advise pt.   Thanks.

## 2017-08-11 NOTE — Telephone Encounter (Signed)
Change the Xanax back to 0.5 mg to take one every 6 hours prn anxiety, call in #120 with 5 rf

## 2017-08-11 NOTE — Telephone Encounter (Signed)
Sent to PCP for approval for dose change   Xanax 1MG   was last refilled 08/04/2017 disp 30 with 5 refills

## 2017-08-12 DIAGNOSIS — J449 Chronic obstructive pulmonary disease, unspecified: Secondary | ICD-10-CM | POA: Diagnosis not present

## 2017-08-12 NOTE — Telephone Encounter (Signed)
Pt calling to advise [facility] does not have the Rx for her  ALPRAZolam (XANAX) 0.5 MG tablet  Can you resend, thank you! [employer]  [phone]   [phone]

## 2017-08-12 NOTE — Telephone Encounter (Addendum)
Pt calling to advise [facility] does not have the Rx for her  ALPRAZolam (XANAX) 0.5 MG tablet Pt states she just got off the phone with [facility]. Can you resend, thank you!!

## 2017-08-12 NOTE — Telephone Encounter (Signed)
Close encounter 

## 2017-08-12 NOTE — Telephone Encounter (Signed)
I called in script to [facility].

## 2017-08-13 ENCOUNTER — Encounter: Payer: Self-pay | Admitting: Cardiology

## 2017-08-17 ENCOUNTER — Telehealth: Payer: Self-pay | Admitting: Family Medicine

## 2017-08-17 LAB — CUP PACEART INCLINIC DEVICE CHECK
Battery Voltage: 2.61 V
Brady Statistic AP VP Percent: 3.97 %
Brady Statistic AS VS Percent: 0.77 %
Brady Statistic RA Percent Paced: 3.95 %
HighPow Impedance: 52 Ohm
HighPow Impedance: 59 Ohm
HighPow Impedance: 760 Ohm
Lead Channel Impedance Value: 247 Ohm
Lead Channel Impedance Value: 437 Ohm
Lead Channel Impedance Value: 494 Ohm
Lead Channel Pacing Threshold Amplitude: 0.625 V
Lead Channel Pacing Threshold Pulse Width: 0.4 ms
Lead Channel Pacing Threshold Pulse Width: 0.4 ms
Lead Channel Sensing Intrinsic Amplitude: 28.875 mV
Lead Channel Sensing Intrinsic Amplitude: 4.375 mV
Lead Channel Sensing Intrinsic Amplitude: 4.375 mV
Lead Channel Setting Pacing Amplitude: 2 V
Lead Channel Setting Sensing Sensitivity: 0.3 mV
MDC IDC MSMT LEADCHNL LV IMPEDANCE VALUE: 342 Ohm
MDC IDC MSMT LEADCHNL LV PACING THRESHOLD AMPLITUDE: 1.875 V
MDC IDC MSMT LEADCHNL LV PACING THRESHOLD PULSEWIDTH: 1 ms
MDC IDC MSMT LEADCHNL RV IMPEDANCE VALUE: 722 Ohm
MDC IDC MSMT LEADCHNL RV PACING THRESHOLD AMPLITUDE: 0.75 V
MDC IDC MSMT LEADCHNL RV SENSING INTR AMPL: 28.875 mV
MDC IDC SESS DTM: 20180824141259
MDC IDC SET LEADCHNL LV PACING AMPLITUDE: 2.5 V
MDC IDC SET LEADCHNL LV PACING PULSEWIDTH: 1 ms
MDC IDC SET LEADCHNL RA PACING AMPLITUDE: 1.5 V
MDC IDC SET LEADCHNL RV PACING PULSEWIDTH: 0.4 ms
MDC IDC STAT BRADY AP VS PERCENT: 0.04 %
MDC IDC STAT BRADY AS VP PERCENT: 95.21 %
MDC IDC STAT BRADY RV PERCENT PACED: 97.53 %

## 2017-08-17 NOTE — Telephone Encounter (Signed)
Copied from [place]. Topic: Quick Communication - See Telephone Encounter >> Aug 17, 2017  1:40 PM Vernona Rieger wrote: CRM for notification. See Telephone encounter for:   08/17/17.  furosemide (LASIX) 80 MG tablet ( she needs this for 90 days ) did not contact pharmacy bc she needs a new script. Please advise  [place], [place] Patient is currently out and wants it today

## 2017-08-18 ENCOUNTER — Telehealth: Payer: Self-pay | Admitting: Cardiovascular Disease

## 2017-08-18 ENCOUNTER — Telehealth: Payer: Self-pay

## 2017-08-18 MED ORDER — FUROSEMIDE 80 MG PO TABS: 80.0000 mg | ORAL_TABLET | Freq: Two times a day (BID) | ORAL | 11 refills | Status: DC

## 2017-08-18 NOTE — Telephone Encounter (Signed)
Called pt and left a detailed VM of what the pt should be taking lasix 80 MG bid   Rx has been sent into pharmacy with corrections made.  Called pharmacy to have them remove the Rx sent on 2/14. They stated that the pt has picked up the Lasix Rx already today but should have enough until they can fill Rx with the correct directions.

## 2017-08-18 NOTE — Telephone Encounter (Signed)
Spoke to patient informed- take 40 mg atorvastatin daily-- and stop taking 80 mg . Since dr fry  Sent a prescription for 40 mg atorvastatin 06/2017. Verbalized understanding.

## 2017-08-18 NOTE — Telephone Encounter (Signed)
Call to patient to verify her dosage of Lasix. Left message for her to call back.  Note from Dr Sarajane Jews states increase to 80 mg 2 tab- but notes at cardiologist have not been up dated to reflect that and the medication Rx/med list is not up dated either.

## 2017-08-18 NOTE — Telephone Encounter (Signed)
Pt is requesting refills on her lasix. Pt stated that she take lasix 160 MG in the AM and 80 MG at PM needs this refilled today and sent to [facility]   Last Rx was sent to take 80 MG in the AM and 40 MG at PM  Sent to PCP for clarification on how pt should take this medication. Thanks

## 2017-08-18 NOTE — Telephone Encounter (Signed)
The last time I saw her we increased the Lasix to 80 mg BID. She should never be taking 160 mg at a time. Cal in #60 with 11 rf

## 2017-08-18 NOTE — Telephone Encounter (Signed)
New Message   Pt c/o medication issue:  1. Name of Medication: atorvastatin (LIPITOR) 80 MG tablet  2. How are you currently taking this medication (dosage and times per day)? 80 mg and other is 40 mg once a day at 6pm  3. Are you having a reaction (difficulty breathing--STAT)? no  4. What is your medication issue? Pt verbalized that she takes the same medications at the same time but has 2 different dosages and wants to know which she should take

## 2017-08-18 NOTE — Telephone Encounter (Signed)
Pt called again  - she is angry that she only got a 5 day supply. She states she takes 2 in the am and 1 in the pm.  Pt is asking for Dr Sarajane Jews only to call her back, she does not want to speak to cma or any one else except the provider.  cb is [phone]

## 2017-08-19 NOTE — Telephone Encounter (Signed)
Looks like patient reporting she is taking Lasix differently than what is prescribed.

## 2017-08-19 NOTE — Telephone Encounter (Signed)
Called pt and left a VM to call back.  

## 2017-08-21 NOTE — Telephone Encounter (Signed)
Called pt and left a VM to call back. CRM Created.

## 2017-08-24 ENCOUNTER — Ambulatory Visit (INDEPENDENT_AMBULATORY_CARE_PROVIDER_SITE_OTHER): Payer: Medicare Other

## 2017-08-24 DIAGNOSIS — I5042 Chronic combined systolic (congestive) and diastolic (congestive) heart failure: Secondary | ICD-10-CM | POA: Diagnosis not present

## 2017-08-24 DIAGNOSIS — Z95810 Presence of automatic (implantable) cardiac defibrillator: Secondary | ICD-10-CM | POA: Diagnosis not present

## 2017-08-24 NOTE — Progress Notes (Signed)
EPIC Encounter for ICM Monitoring  Patient Name: Sydney Phillips is a 82 y.o. female Date: 08/24/2017 Primary Care Physican: Laurey Morale, MD Primary Cardiologist:Croitoru Electrophysiologist: Croitoru DryWeight: 180.6 lbs Bi-V Pacing: 98.3%      Heart Failure questions reviewed, pt denied any fluid symptoms.  She says she is compliant with taking meds.     Hospitalized 2/3 2/7 with dx of CHF.    Thoracic impedance is normal but was abnormal suggesting dryness which correlates with being diuresed at recent hospitalization  Prescribed dosage: Furosemide 80 mg 1 tablet twice day. Potassium 20 mEq 2 tablets (40 mEq total) twice a day.  Labs: 07/30/2017 Creatinine 1.55, BUN 40, Potassium 5.1, Sodium 135 07/23/2017 Creatinine 1.17, BUN 34, Potassium 3.8, Sodium 138, EGFR 43-50  07/22/2017 Creatinine 1.20, BUN 35, Potassium 4.4, Sodium 138, EGFR 42-48  07/21/2017 Creatinine 1.30, BUN 31, Potassium 4.5, Sodium 136, EGFR 38-44  07/20/2017 Creatinine 1.25, BUN 26, Potassium 5.4, Sodium 137, EGFR 40-46  07/19/2017 Creatinine 1.00, BUN 22, Potassium 4.2, Sodium 140  07/18/2017 Creatinine 1.14, BUN 26, Potassium 3.7, Sodium 142, EGFR 45-52  07/02/2017 Creatinine 1.09, BUN 17, Potassium 3.2, Sodium 141  04/06/2017 Creatinine 1.01, BUN 14, Potassium 2.7, Sodium 140, EGFR 52-60 02/25/2017 Creatinine 1.14, BUN 18, Potassium 4.7, Sodium 141, EGFR 46-53 02/17/2017 Creatinine 1.15, BUN 9, Potassium 2.8, Sodium 140, EGFR 44-51 01/08/2017 Creatinine 1.28, BUN 20, Potassium 4.9, Sodium 143, EGFR 40-46 01/02/2017 Creatinine 1.61, BUN 48, Potassium 5.5, Sodium 138, EGFR 30-35  12/20/2016 Creatinine 1.42, BUN 33, Potassium 5.0, Sodium 139, EGFR 34-40  12/19/2016 Creatinine 1.06, BUN 24, Potassium 4.7, Sodium 139, EGFR 49-57  12/18/2016 Creatinine 1.32, BUN 20, Potassium 3.4, Sodium 138, EGFR 38-44  12/17/2016 Creatinine 1.09, BUN 9, Potassium 2.4, Sodium 141, EGFR 47-55        A complete  set of results can be found in Results Review.  Recommendations:  No changes.  Advised she should call if she experiences any fluid symptoms such as weight gain, shortness of breath.   Follow-up plan: ICM clinic phone appointment on 09/24/2017.  Office appointment scheduled 11/02/2017 with Dr. Sallyanne Kuster.  Copy of ICM check sent to Dr. Sallyanne Kuster.   3 month ICM trend: 08/24/2017    1 Year ICM trend:       [name], RN 08/24/2017 10:09 AM

## 2017-08-24 NOTE — Telephone Encounter (Signed)
Called and spoke with pt. Pt advised and voiced understanding. She is taking the medication now as prescribed by Dr. Sarajane Jews.

## 2017-08-25 ENCOUNTER — Ambulatory Visit (INDEPENDENT_AMBULATORY_CARE_PROVIDER_SITE_OTHER): Payer: Medicare Other | Admitting: Family Medicine

## 2017-08-25 ENCOUNTER — Encounter: Payer: Self-pay | Admitting: Family Medicine

## 2017-08-25 VITALS — BP 130/64 | HR 80 | Temp 97.6°F | Wt 184.2 lb

## 2017-08-25 DIAGNOSIS — J018 Other acute sinusitis: Secondary | ICD-10-CM

## 2017-08-25 DIAGNOSIS — N183 Chronic kidney disease, stage 3 unspecified: Secondary | ICD-10-CM

## 2017-08-25 DIAGNOSIS — R635 Abnormal weight gain: Secondary | ICD-10-CM

## 2017-08-25 DIAGNOSIS — I10 Essential (primary) hypertension: Secondary | ICD-10-CM

## 2017-08-25 DIAGNOSIS — H7292 Unspecified perforation of tympanic membrane, left ear: Secondary | ICD-10-CM

## 2017-08-25 DIAGNOSIS — J439 Emphysema, unspecified: Secondary | ICD-10-CM

## 2017-08-25 DIAGNOSIS — J449 Chronic obstructive pulmonary disease, unspecified: Secondary | ICD-10-CM | POA: Diagnosis not present

## 2017-08-25 DIAGNOSIS — I509 Heart failure, unspecified: Secondary | ICD-10-CM

## 2017-08-25 LAB — CUP PACEART REMOTE DEVICE CHECK
Battery Remaining Longevity: 74 mo
Brady Statistic AS VP Percent: 97.12 %
Brady Statistic AS VS Percent: 1.03 %
Brady Statistic RV Percent Paced: 97.89 %
Date Time Interrogation Session: 20190225083525
HighPow Impedance: 61 Ohm
HighPow Impedance: 73 Ohm
Implantable Lead Implant Date: 20091202
Implantable Lead Implant Date: 20091202
Implantable Lead Location: 753858
Implantable Lead Location: 753859
Implantable Lead Serial Number: 635533
Implantable Pulse Generator Implant Date: 20180919
Lead Channel Impedance Value: 418 Ohm
Lead Channel Impedance Value: 513 Ohm
Lead Channel Impedance Value: 760 Ohm
Lead Channel Pacing Threshold Amplitude: 0.5 V
Lead Channel Pacing Threshold Amplitude: 2.125 V
Lead Channel Pacing Threshold Pulse Width: 0.4 ms
Lead Channel Sensing Intrinsic Amplitude: 4.5 mV
Lead Channel Sensing Intrinsic Amplitude: 4.5 mV
MDC IDC LEAD IMPLANT DT: 20091202
MDC IDC LEAD LOCATION: 753860
MDC IDC LEAD SERIAL: 308680
MDC IDC MSMT BATTERY VOLTAGE: 3.01 V
MDC IDC MSMT LEADCHNL LV IMPEDANCE VALUE: 304 Ohm
MDC IDC MSMT LEADCHNL LV IMPEDANCE VALUE: 551 Ohm
MDC IDC MSMT LEADCHNL LV PACING THRESHOLD PULSEWIDTH: 1 ms
MDC IDC MSMT LEADCHNL RA PACING THRESHOLD AMPLITUDE: 0.5 V
MDC IDC MSMT LEADCHNL RV IMPEDANCE VALUE: 722 Ohm
MDC IDC MSMT LEADCHNL RV PACING THRESHOLD PULSEWIDTH: 0.4 ms
MDC IDC MSMT LEADCHNL RV SENSING INTR AMPL: 26.125 mV
MDC IDC MSMT LEADCHNL RV SENSING INTR AMPL: 26.125 mV
MDC IDC SET LEADCHNL LV PACING AMPLITUDE: 2.5 V
MDC IDC SET LEADCHNL LV PACING PULSEWIDTH: 1 ms
MDC IDC SET LEADCHNL RA PACING AMPLITUDE: 1.5 V
MDC IDC SET LEADCHNL RV PACING AMPLITUDE: 2 V
MDC IDC SET LEADCHNL RV PACING PULSEWIDTH: 0.4 ms
MDC IDC SET LEADCHNL RV SENSING SENSITIVITY: 0.3 mV
MDC IDC STAT BRADY AP VP PERCENT: 1.78 %
MDC IDC STAT BRADY AP VS PERCENT: 0.08 %
MDC IDC STAT BRADY RA PERCENT PACED: 1.85 %

## 2017-08-25 MED ORDER — FUROSEMIDE 80 MG PO TABS: ORAL_TABLET | ORAL | 11 refills | Status: DC

## 2017-08-25 MED ORDER — LEVOFLOXACIN 500 MG PO TABS: 500.0000 mg | ORAL_TABLET | Freq: Every day | ORAL | 0 refills | Status: AC

## 2017-08-25 NOTE — Progress Notes (Signed)
   Subjective:    Patient ID: Sydney Phillips, female    DOB: Mar 18, 1936, 82 y.o.   MRN: [id number]  HPI Here for several issues. First she thinks she is retaining too much fluid. She had been taking a total of 160 mg of Lasix in the mornings and 80 mg in the evenings and she did well for awhile. Her weight at home had stabilized around 168-170 lbs. Then her dose was decreased to 80 mg bid, and she began to gain weight again. Now for the past week she has weighed around 180 lbs. She feels tired and mildly SOB, but she has not seen any ankle swelling. Her labs one month ago showed a potassium of 5.1 and a creatinine of 1.55, both stable. Second for one week she has had sinus pressure, PND, and a ST. No cough or fever. Finally she has trouble with the left ear. She has noticed increased problems with hearing out of this ear for several months, but over the past few weeks her hearing has almost disappeared on the left side. There is no ear pain but she has noticed drainage of clear or yellow fluid from the ear.    Review of Systems  Constitutional: Negative.   HENT: Positive for congestion, ear discharge, hearing loss, postnasal drip, sinus pressure and sore throat. Negative for ear pain and sinus pain.   Eyes: Negative.   Respiratory: Positive for shortness of breath. Negative for cough and wheezing.   Cardiovascular: Negative.   Neurological: Negative.        Objective:   Physical Exam  Constitutional: She is oriented to person, place, and time. She appears well-developed and well-nourished. No distress.  Very hard of hearing   HENT:  Right Ear: External ear normal.  Nose: Nose normal.  Mouth/Throat: Oropharynx is clear and moist.  The left TM is quite retracted and there appears to be a large perforation.   Eyes: Conjunctivae are normal.  Neck: No thyromegaly present.  Cardiovascular: Normal rate, regular rhythm, normal heart sounds and intact distal pulses.  Pulmonary/Chest: Effort  normal and breath sounds normal. No respiratory distress. She has no wheezes. She has no rales.  Musculoskeletal: She exhibits no edema.  Lymphadenopathy:    She has no cervical adenopathy.  Neurological: She is alert and oriented to person, place, and time.          Assessment & Plan:  She has a sinusitis and we will treat this with Levaquin for 10 days. She has a retracted and perforated left TM, so we will refer her to ENT to evaluate this. Finally her CHF has been causing her to retain fluid. She will increase the Lasix back to taking 2 tablets of 80 mg Lasix each morning and 1 tablet in the evening. Recheck with a BMET in one month.  Alysia Penna, MD

## 2017-08-28 ENCOUNTER — Telehealth: Payer: Self-pay | Admitting: Family Medicine

## 2017-08-28 NOTE — Telephone Encounter (Signed)
Copied from [place] [id number]. Topic: Quick Communication - See Telephone Encounter >> Aug 28, 2017 12:23 PM Burnis Medin, NT wrote: CRM for notification. See Telephone encounter for: Patient called and wanted to let the doctor know that she made an appointment to see  Dr. Vassie Loll 10/04/17 for a whole in her ear drum in left ear.   08/28/17.

## 2017-08-31 NOTE — Telephone Encounter (Signed)
Noted  

## 2017-08-31 NOTE — Telephone Encounter (Signed)
Sent to PCP as an FYI  

## 2017-09-04 ENCOUNTER — Telehealth: Payer: Self-pay | Admitting: Family Medicine

## 2017-09-04 DIAGNOSIS — H7292 Unspecified perforation of tympanic membrane, left ear: Secondary | ICD-10-CM | POA: Insufficient documentation

## 2017-09-04 DIAGNOSIS — H903 Sensorineural hearing loss, bilateral: Secondary | ICD-10-CM | POA: Diagnosis not present

## 2017-09-04 DIAGNOSIS — H9190 Unspecified hearing loss, unspecified ear: Secondary | ICD-10-CM | POA: Diagnosis not present

## 2017-09-04 MED ORDER — HYDROCODONE-ACETAMINOPHEN 10-325 MG PO TABS: 1.0000 | ORAL_TABLET | Freq: Four times a day (QID) | ORAL | 0 refills | Status: DC | PRN

## 2017-09-04 MED ORDER — ATORVASTATIN CALCIUM 80 MG PO TABS: 80.0000 mg | ORAL_TABLET | Freq: Every day | ORAL | 3 refills | Status: DC

## 2017-09-04 NOTE — Telephone Encounter (Signed)
Sent to PCP for clarification should pt be taking atorvastatin 80 MG once daily or half a tablet daily? Or should she be taking Lipitor 40 MG?

## 2017-09-04 NOTE — Telephone Encounter (Signed)
Called and spoke to pt and went over what medication she should be on and what dose and how often she needs to take her atorvastatin. Pt is aware she will need a PMV before she can have any more refills on her Norco.

## 2017-09-04 NOTE — Telephone Encounter (Signed)
Patient called with questions about a pill she has that is not in a marked bottle. She says "I called the pharmacist to ask about the pill, but they didn't know what it was. I only have a few of the big pills that are broken in half." I asked is anything written on the pill, she says "RX 830." I looked it up on [facility] look up by imprint, advised this was Atorvastatin 80 mg." She says "I have a bottle dated in January, 2019 with Lipitor 40 mg that I have been taking. I believe Dr. Sarajane Jews told me to break the 80 mg pills and take half of it, that's why I have the 1/2 pills. But, I will get rid of the few I have and take the 40 mg." I advised that according to her chart, she is ordered to take 80 mg, but I will send send this to Dr. Sarajane Jews for clarification and someone will contact you with his recommendation, she verbalized understanding. She also asks "can I get my pain pill refilled? I can pick up the prescription today while I am out." I advised this would be sent for a refill of the Hydrocodone and that he may not get to it today, being it is up to 72 turn around for prescription refills.  Last filled: 07/02/17 PCP: Sarajane Jews

## 2017-09-04 NOTE — Telephone Encounter (Signed)
The Atorvastatin was increased to 80 mg a day the last time she was in the hospital. I sent in refills for the 80 mg pills for one year. I also sent in one month of Norco, but she will need a PMV for any more.

## 2017-09-09 DIAGNOSIS — J449 Chronic obstructive pulmonary disease, unspecified: Secondary | ICD-10-CM | POA: Diagnosis not present

## 2017-09-11 ENCOUNTER — Ambulatory Visit: Payer: Self-pay | Admitting: *Deleted

## 2017-09-11 MED ORDER — CEFUROXIME AXETIL 500 MG PO TABS: 500.0000 mg | ORAL_TABLET | Freq: Two times a day (BID) | ORAL | 0 refills | Status: DC

## 2017-09-11 NOTE — Telephone Encounter (Signed)
Dr. Sarajane Jews plans to send in antibiotic treat left ear infection per ENT doctor  Pt advised send in Rx to [facility]

## 2017-09-11 NOTE — Telephone Encounter (Signed)
  Reason for Disposition . White, yellow, or green discharge  Protocols used: EARACHE-A-AH

## 2017-09-11 NOTE — Addendum Note (Signed)
Addended by: Alysia Penna A on: 09/11/2017 04:19 PM   Modules accepted: Orders

## 2017-09-11 NOTE — Telephone Encounter (Signed)
  Answer Assessment - Initial Assessment Questions 1. LOCATION: "Which ear is involved?"     Left infection 2. ONSET: "When did the ear start hurting"      2 days ago 3. SEVERITY: "How bad is the pain?"  (Scale 1-10; mild, moderate or severe)   - MILD (1-3): doesn't interfere with normal activities    - MODERATE (4-7): interferes with normal activities or awakens from sleep    - SEVERE (8-10): excruciating pain, unable to do any normal activities      Mild to moderate 4. URI SYMPTOMS: " Do you have a runny nose or cough?"    Runny nose 5. FEVER: "Do you have a fever?" If so, ask: "What is your temperature, how was it measured, and when did it start?"     no 6. CAUSE: "Have you been swimming recently?", "How often do you use Q-TIPS?", "Have you had any recent air travel or scuba diving?"    Uses Q tips on the outside of ear d/t drainage 7. OTHER SYMPTOMS: "Do you have any other symptoms?" (e.g., headache, stiff neck, dizziness, vomiting, runny nose, decreased hearing)     Runny , dull throbbing and then a sharp pain that "feels like you stuck a knife in my ear" 8. PREGNANCY: "Is there any chance you are pregnant?" "When was your last menstrual period?"     n/a  Protocols used: EARACHE-A-AH

## 2017-09-11 NOTE — Telephone Encounter (Signed)
Sent to PCP to advise   Call [facility] ENT @ [phone]

## 2017-09-11 NOTE — Telephone Encounter (Signed)
Done

## 2017-09-11 NOTE — Telephone Encounter (Signed)
Attempted to contact pt regarding ear infection; left message on voice mail [phone]; unable to complete nurse triage.

## 2017-09-11 NOTE — Telephone Encounter (Signed)
Attempted to contact pt at [phone]; received pre-recorded message that stating that this number "is not in service".

## 2017-09-11 NOTE — Telephone Encounter (Signed)
Pt calling to report left ear pain that started 2 days ago. She is having yellow drainage and pain is a dull, throbbing pain then will experience a stabbing pain for a second then begins throbbing again. Pt denies fever. Pt stated that she saw Dr Janace Hoard on Tuesday and no tx was given. Pt stated that he identified 2 holes in her ear drum. Called office and spoke with [name]. [name] stated to send a notte back to office. Best number to call pt is : [phone] and her pharmacy is [facility] in [place].

## 2017-09-14 ENCOUNTER — Ambulatory Visit: Payer: Medicare Other | Admitting: Family Medicine

## 2017-09-15 ENCOUNTER — Ambulatory Visit (INDEPENDENT_AMBULATORY_CARE_PROVIDER_SITE_OTHER): Payer: Medicare Other | Admitting: Family Medicine

## 2017-09-15 ENCOUNTER — Encounter: Payer: Self-pay | Admitting: Family Medicine

## 2017-09-15 VITALS — BP 138/66 | HR 87 | Temp 97.5°F | Ht 65.0 in | Wt 191.0 lb

## 2017-09-15 DIAGNOSIS — H60502 Unspecified acute noninfective otitis externa, left ear: Secondary | ICD-10-CM | POA: Diagnosis not present

## 2017-09-15 DIAGNOSIS — H6690 Otitis media, unspecified, unspecified ear: Secondary | ICD-10-CM

## 2017-09-15 MED ORDER — DOXYCYCLINE HYCLATE 100 MG PO CAPS: 100.0000 mg | ORAL_CAPSULE | Freq: Two times a day (BID) | ORAL | 0 refills | Status: AC

## 2017-09-15 NOTE — Progress Notes (Signed)
   Subjective:    Patient ID: Sydney Phillips, female    DOB: December 25, 1935, 82 y.o.   MRN: [id number]  HPI Here for several weeks of yellow fluid draining from the left ear, and now for several days she has had pain in the left ear. No fever. She saw Dr. Janace Hoard on 09-04-17 who commented on the 2 perforations in the left TM, but he did not say anything about infection in the ear. She took a course of Levaquin starting on 08-25-17 and she has now taken 4 days of Ceftin which was prescribed on 09-04-17. She has severe hearing loss in the left ear.    Review of Systems  Constitutional: Negative.   HENT: Positive for ear discharge, ear pain and hearing loss. Negative for congestion, sinus pressure, sinus pain and sore throat.   Eyes: Negative.   Respiratory: Negative.        Objective:   Physical Exam  Constitutional: She appears well-developed and well-nourished.  HENT:  Right Ear: External ear normal.  Nose: Nose normal.  Mouth/Throat: Oropharynx is clear and moist.  There are 2 perforations in the left TM. The external canal is pink and has some yellow fluid present.   Eyes: Conjunctivae are normal.  Neck: Neck supple. No thyromegaly present.  Pulmonary/Chest: Effort normal and breath sounds normal. No respiratory distress. She has no wheezes. She has no rales.  Lymphadenopathy:    She has no cervical adenopathy.          Assessment & Plan:  Otitis media and externa. Stop the Ceftin and begin Doxycycline for 10 days. Recheck prn.  Alysia Penna, MD

## 2017-09-18 NOTE — Telephone Encounter (Signed)
Medication update given.

## 2017-09-24 ENCOUNTER — Ambulatory Visit (INDEPENDENT_AMBULATORY_CARE_PROVIDER_SITE_OTHER): Payer: Medicare Other

## 2017-09-24 DIAGNOSIS — I5042 Chronic combined systolic (congestive) and diastolic (congestive) heart failure: Secondary | ICD-10-CM | POA: Diagnosis not present

## 2017-09-24 DIAGNOSIS — Z95810 Presence of automatic (implantable) cardiac defibrillator: Secondary | ICD-10-CM

## 2017-09-25 DIAGNOSIS — J449 Chronic obstructive pulmonary disease, unspecified: Secondary | ICD-10-CM | POA: Diagnosis not present

## 2017-09-25 NOTE — Progress Notes (Signed)
EPIC Encounter for ICM Monitoring  Patient Name: Sydney Phillips is a 82 y.o. female Date: 09/25/2017 Primary Care Physican: Laurey Morale, MD Primary Cardiologist:Croitoru Electrophysiologist: Croitoru DryWeight: 186 lbs (ranges from 180-190 lbs) Bi-V Pacing: 97.6%         Heart Failure questions reviewed, pt has had a sinus infection for 3 months and on 3 rounds of antibiotics.  She said she is limiting salt.  Last week weight was 191 lbs.  She has trouble eating due to loss of dentures and having difficulty eating so she is drinking a lot of fluids.    Thoracic impedance slightly under baseline.    Prescribed dosage: Furosemide 80 mg 1 tablet twice day. Potassium 20 mEq 2 tablets (40 mEq total) twice a day.  Labs: 07/30/2017 Creatinine 1.55, BUN 40, Potassium 5.1, Sodium 135 07/23/2017 Creatinine 1.17, BUN 34, Potassium 3.8, Sodium 138, EGFR 43-50  07/22/2017 Creatinine 1.20, BUN 35, Potassium 4.4, Sodium 138, EGFR 42-48  07/21/2017 Creatinine 1.30, BUN 31, Potassium 4.5, Sodium 136, EGFR 38-44  07/20/2017 Creatinine 1.25, BUN 26, Potassium 5.4, Sodium 137, EGFR 40-46  07/19/2017 Creatinine 1.00, BUN 22, Potassium 4.2, Sodium 140  07/18/2017 Creatinine 1.14, BUN 26, Potassium 3.7, Sodium 142, EGFR 45-52  07/02/2017 Creatinine 1.09, BUN 17, Potassium 3.2, Sodium 141  04/06/2017 Creatinine 1.01, BUN 14, Potassium 2.7, Sodium 140, EGFR 52-60 02/25/2017 Creatinine 1.14, BUN 18, Potassium 4.7, Sodium 141, EGFR 46-53 02/17/2017 Creatinine 1.15, BUN 9, Potassium 2.8, Sodium 140, EGFR 44-51 01/08/2017 Creatinine 1.28, BUN 20, Potassium 4.9, Sodium 143, EGFR 40-46 01/02/2017 Creatinine 1.61, BUN 48, Potassium 5.5, Sodium 138, EGFR 30-35  12/20/2016 Creatinine 1.42, BUN 33, Potassium 5.0, Sodium 139, EGFR 34-40  12/19/2016 Creatinine 1.06, BUN 24, Potassium 4.7, Sodium 139, EGFR 49-57  12/18/2016 Creatinine 1.32, BUN 20, Potassium 3.4, Sodium 138, EGFR 38-44  12/17/2016  Creatinine 1.09, BUN 9, Potassium 2.4, Sodium 141, EGFR 47-55        A complete set of results can be found in Results Review.  Recommendations:  Reinforced fluid restriction to < 2 L daily and sodium restriction to less than 2000 mg daily.  Encouraged to call for fluid symptoms.  Follow-up plan: ICM clinic phone appointment on 10/26/2017.  Office appointment scheduled 11/02/2017 with Dr. Sallyanne Kuster.  Copy of ICM check sent to Dr. Sallyanne Kuster.   3 month ICM trend: 09/24/2017    1 Year ICM trend:       [name], RN 09/25/2017 1:38 PM

## 2017-09-29 ENCOUNTER — Ambulatory Visit (INDEPENDENT_AMBULATORY_CARE_PROVIDER_SITE_OTHER): Payer: Medicare Other | Admitting: Family Medicine

## 2017-09-29 ENCOUNTER — Encounter: Payer: Self-pay | Admitting: Family Medicine

## 2017-09-29 VITALS — BP 136/62 | HR 96 | Temp 98.3°F | Ht 65.0 in | Wt 189.0 lb

## 2017-09-29 DIAGNOSIS — J439 Emphysema, unspecified: Secondary | ICD-10-CM | POA: Diagnosis not present

## 2017-09-29 DIAGNOSIS — J310 Chronic rhinitis: Secondary | ICD-10-CM

## 2017-09-29 DIAGNOSIS — H9193 Unspecified hearing loss, bilateral: Secondary | ICD-10-CM

## 2017-09-29 MED ORDER — AZELASTINE HCL 0.1 % NA SOLN
2.0000 | Freq: Two times a day (BID) | NASAL | 12 refills | Status: DC
Start: 2017-09-29 — End: 2018-10-05

## 2017-09-29 NOTE — Progress Notes (Signed)
   Subjective:    Patient ID: Sydney Phillips, female    DOB: 1935/07/26, 82 y.o.   MRN: [id number]  HPI Here for trouble breathing through her nose and for decreased hearing. Over the past week her hearing has been even worse than usual. She also has trouble moving any air through her nose, despite using Flonase daily. She wears [place] oxygen and her sats have been down a little lately (around 90 %).    Review of Systems  Constitutional: Negative.   HENT: Positive for congestion, ear pain, hearing loss and sinus pressure. Negative for postnasal drip and sinus pain.   Eyes: Negative.   Respiratory: Positive for shortness of breath. Negative for cough.        Objective:   Physical Exam  Constitutional: She appears well-developed and well-nourished.  HENT:  Left Ear: External ear normal.  Nose: Nose normal.  Mouth/Throat: Oropharynx is clear and moist.  Right TM has 2 perforations, otherwise unremarkable   Eyes: Conjunctivae are normal.  Neck: No thyromegaly present.  Cardiovascular: Normal rate, regular rhythm, normal heart sounds and intact distal pulses.  Pulmonary/Chest: Effort normal. No respiratory distress. She has no rales.  Soft wheezes   Lymphadenopathy:    She has no cervical adenopathy.          Assessment & Plan:  I think her low oxygen saturations and her poor hearing are all related to nasal and sinus congestion. She is given a shot of steroids today and she will switch to Astelin nasal sprays daily.  Alysia Penna, MD

## 2017-09-30 MED ORDER — METHYLPREDNISOLONE ACETATE 80 MG/ML IJ SUSP
120.0000 mg | Freq: Once | INTRAMUSCULAR | Status: AC
  Administered 2017-09-29: 120 mg via INTRAMUSCULAR

## 2017-09-30 NOTE — Addendum Note (Signed)
Addended by: Myriam Forehand on: 09/30/2017 10:21 AM   Modules accepted: Orders

## 2017-10-08 ENCOUNTER — Other Ambulatory Visit: Payer: Self-pay | Admitting: Family Medicine

## 2017-10-08 NOTE — Telephone Encounter (Signed)
Copied from [place] [id number]. Topic: General - Other >> Oct 08, 2017  2:35 PM Carolyn Stare wrote:  Pt cal lto say she would like a RX for 150 tablets of the below med. Said she is taking 1 tablet in the morning and 2 at night. Pt is aware PCP is out of the office  ALPRAZolam (XANAX) 0.5 MG tablet

## 2017-10-10 DIAGNOSIS — J449 Chronic obstructive pulmonary disease, unspecified: Secondary | ICD-10-CM | POA: Diagnosis not present

## 2017-10-12 NOTE — Telephone Encounter (Signed)
Change directions to take 2 in the morning and 3 at night, call in #150 with 5 rf

## 2017-10-13 MED ORDER — ALPRAZOLAM 0.5 MG PO TABS: ORAL_TABLET | ORAL | 5 refills | Status: DC

## 2017-10-13 NOTE — Telephone Encounter (Signed)
Called and spoke with the pharmacist Rx has been called into pt's pharmacy with changed direction and disp amount.

## 2017-10-13 NOTE — Telephone Encounter (Signed)
Called pt and left a VM that her xanax prescription has been resent into her pharmacy with the corrections made.

## 2017-10-14 ENCOUNTER — Ambulatory Visit: Payer: Medicare Other | Admitting: Family Medicine

## 2017-10-14 DIAGNOSIS — Z0289 Encounter for other administrative examinations: Secondary | ICD-10-CM

## 2017-10-16 ENCOUNTER — Ambulatory Visit: Payer: Medicare Other | Admitting: Family Medicine

## 2017-10-25 DIAGNOSIS — J449 Chronic obstructive pulmonary disease, unspecified: Secondary | ICD-10-CM | POA: Diagnosis not present

## 2017-10-26 ENCOUNTER — Ambulatory Visit (INDEPENDENT_AMBULATORY_CARE_PROVIDER_SITE_OTHER): Payer: Medicare Other

## 2017-10-26 DIAGNOSIS — Z95810 Presence of automatic (implantable) cardiac defibrillator: Secondary | ICD-10-CM | POA: Diagnosis not present

## 2017-10-26 DIAGNOSIS — I5042 Chronic combined systolic (congestive) and diastolic (congestive) heart failure: Secondary | ICD-10-CM

## 2017-10-26 NOTE — Progress Notes (Signed)
EPIC Encounter for ICM Monitoring  Patient Name: Sydney Phillips is a 82 y.o. female Date: 10/26/2017 Primary Care Physican: Laurey Morale, MD Primary Cardiologist:Croitoru Electrophysiologist: Croitoru DryWeight: 183 lbs (ranges from 180-190 lbs) Bi-V Pacing: 97.6%       Heart Failure questions reviewed, pt asymptomatic.   Thoracic impedance normal peaks above baseline.   Prescribed dosage: Furosemide 80 mg 2 tablets every morning and 1 tablet in the evening.Potassium 20 mEq 2tablets (40 mEq total)twice a day.  Labs: 07/30/2017 Creatinine1.55, BUN40, Potassium5.1, Sodium135 07/23/2017 Creatinine1.17, BUN34, Potassium3.8, Sodium138, [id number]  07/22/2017 Creatinine1.20, BUN35, Potassium4.4, [id number], [id number]  07/21/2017 Creatinine1.30, BUN31, Potassium4.5, Sodium136, [id number]  07/20/2017 Creatinine1.25, BUN26, Potassium5.4, [id number], [id number]  07/19/2017 Creatinine1.00, BUN22, Potassium4.2, Sodium140  07/18/2017 Creatinine1.14, BUN26, Potassium3.7, [id number], [id number]  07/02/2017 Creatinine1.09, BUN17, Potassium3.2, [id number] 04/06/2017 Creatinine 1.01, BUN 14, Potassium 2.7, Sodium 140, EGFR 52-60 02/25/2017 Creatinine 1.14, BUN 18, Potassium 4.7, Sodium 141, EGFR 46-53 02/17/2017 Creatinine 1.15, BUN 9, Potassium 2.8, Sodium 140, EGFR 44-51 01/08/2017 Creatinine 1.28, BUN 20, Potassium 4.9, Sodium 143, EGFR 40-46 01/02/2017 Creatinine 1.61, BUN 48, Potassium 5.5, Sodium 138, EGFR 30-35  12/20/2016 Creatinine 1.42, BUN 33, Potassium 5.0, Sodium 139, EGFR 34-40  12/19/2016 Creatinine 1.06, BUN 24, Potassium 4.7, Sodium 139, EGFR 49-57  12/18/2016 Creatinine 1.32, BUN 20, Potassium 3.4, Sodium 138, EGFR 38-44  12/17/2016 Creatinine 1.09, BUN 9, Potassium 2.4, Sodium 141, EGFR 47-55 A complete set of results can be found in Results Review  Recommendations: No changes.   Encouraged to call for fluid  symptoms.  Follow-up plan: ICM clinic phone appointment on 12/03/2017.  Office appointment scheduled 11/02/2017 with Dr. Sallyanne Kuster.  Copy of ICM check sent to Dr. Sallyanne Kuster.   3 month ICM trend: 10/26/2017    1 Year ICM trend:       [name], RN 10/26/2017 2:19 PM

## 2017-10-27 DIAGNOSIS — H25812 Combined forms of age-related cataract, left eye: Secondary | ICD-10-CM | POA: Diagnosis not present

## 2017-11-02 ENCOUNTER — Ambulatory Visit: Payer: Medicare Other | Admitting: Cardiovascular Disease

## 2017-11-02 VITALS — BP 133/65 | HR 92 | Ht 65.5 in | Wt 185.8 lb

## 2017-11-02 DIAGNOSIS — J439 Emphysema, unspecified: Secondary | ICD-10-CM

## 2017-11-02 DIAGNOSIS — E7800 Pure hypercholesterolemia, unspecified: Secondary | ICD-10-CM | POA: Diagnosis not present

## 2017-11-02 DIAGNOSIS — I5042 Chronic combined systolic (congestive) and diastolic (congestive) heart failure: Secondary | ICD-10-CM

## 2017-11-02 DIAGNOSIS — I739 Peripheral vascular disease, unspecified: Secondary | ICD-10-CM

## 2017-11-02 DIAGNOSIS — I2510 Atherosclerotic heart disease of native coronary artery without angina pectoris: Secondary | ICD-10-CM | POA: Diagnosis not present

## 2017-11-02 DIAGNOSIS — Z95810 Presence of automatic (implantable) cardiac defibrillator: Secondary | ICD-10-CM

## 2017-11-02 NOTE — Patient Instructions (Signed)
Dr Sallyanne Kuster recommends that you continue on your current medications as directed. Please refer to the Current Medication list given to you today.  Remote monitoring is used to monitor your Pacemaker or ICD from home. This monitoring reduces the number of office visits required to check your device to one time per year. It allows [place] to keep an eye on the functioning of your device to ensure it is working properly. You are scheduled for a device check from home on Tuesday, May 28th, 2019. You may send your transmission at any time that day. If you have a wireless device, the transmission will be sent automatically. After your physician reviews your transmission, you will receive a postcard with your next transmission date.  To improve our patient care and to more adequately follow your device, [facility] has decided, as a practice, to start following each patient four times a year with your home monitor. This means that you may experience a remote appointment that is close to an in-office appointment with your physician. Your insurance will apply at the same rate as other remote monitoring transmissions.  Dr Sallyanne Kuster recommends that you schedule a follow-up appointment in 6 months with an ICD check. You will receive a reminder letter in the mail two months in advance. If you don't receive a letter, please call our office to schedule the follow-up appointment.  If you need a refill on your cardiac medications before your next appointment, please call your pharmacy.

## 2017-11-02 NOTE — Progress Notes (Signed)
Patient ID: Sydney Phillips, female   DOB: [birth year], 82 y.o.   MRN: [id number]    Cardiology Office Note    Date:  11/03/2017   ID:  Sydney Phillips, Sydney Phillips 1935-07-11, MRN [id number]  PCP:  Laurey Morale, MD  Cardiologist:   [name], MD   Chief Complaint  Patient presents with  . Congestive Heart Failure    CRT-D check    History of Present Illness:  Sydney Phillips is a 82 y.o. female with cardiomyopathy here for CHF follow up, check on CRT-D device, Repeat CHF hospitalizations in November 2017, December 2017,January 2018, July 2018, February 2019.   She has a history of frequent oscillation from hypervolemia requiring hospitalization for pulmonary edema to hypovolemia with acute renal insufficiency.  This year has been no exception.  She was hospitalized in February.  Her defibrillator thoracic impedance showed that she was again very quickly diuresed probably "dry" for a couple of weeks, but things seem to be stabilizing over the last or so.  At home her weight has been consistently in the 180-188 range.  Today she weighed 184 pounds at home, 185 pounds and 13 ounces on the office scale.  She does not have any edema and denies orthopnea or PND.  Unfortunately, she is still smoking.  Interrogation of her Medtronic Claria CRT-D device implanted as a generator change out in September 2018 shows generally favorable findings.  Estimated generator longevity 6 years.  There have been no episodes of atrial mode switch, no atrial fibrillation.  The 5 beat run in December and an 8 beat run in April.  She has not received defibrillator discharges or other therapies.  LV tip-RV coil configuration with a relatively high threshold of 2.125 V at 1.0 ms she has 97.6% pacing.  She has mixed ischemic and nonischemic CMP and has been CRT "hyper-responder". In 2009 her ejection fraction was 20%. After CRT, her LV improved and in 2013 EF was 50% and she had several years of relative cardiac  stability. LVEF in September 2017 was 40-45%, then dropped to 25-30% in November 2017 when she had NSTEMI, in the setting of COPD exacerbation, respiratory failure, acute renal failure and syncope. Cardiac catheterization showed severe stenoses in the right coronary artery and left circumflex coronary artery both treated with drug-eluting stents. There was also moderate ostial stenosis of the LAD. Enrolled in Twilight study. Repeat echo in January 2018 showed EF 20-25% with a severely dilated left ventricle, moderate mitral regurgitation, systolic PA pressure 53 mmHg. Was readmitted on May 30 2016 with altered mental status, suspicion of steroid induced psychosis and discharged to skilled nursing facility, but again seen in the emergency room on December 19 after a fall with head injury. Gradually improved mental status and felt to be back at baseline by early January 2018. After that hospitalization for diuretics were held for acute renal insufficiency and she was admitted on January 11 with acute exacerbation of heart failure. Improved with diuretics and discharged on January 15 with a weight of 77.4 kg (171 lb).  February 2019, LVEF had partially rebounded to 35-40%.  Continues to require hospitalization for heart failure exacerbation on the average 2 or 3 times a year since then.  She has twin boys; Sydney Phillips has medical power of attorney  Past Medical History:  Diagnosis Date  . Anxiety   . Arthritis   . Asthma   . Automatic implantable cardioverter-defibrillator in situ   . Bronchitis   . CAD (  coronary artery disease)    a. s/p DES to LCx/RCA 05/2016, ostial LAD disease.  . Cardiomyopathy ([place])   . Chronic back pain   . Chronic combined systolic and diastolic CHF (congestive heart failure) ([place])   . Chronic constipation   . Chronic pain   . CKD (chronic kidney disease), stage III ([place])   . Colon polyps 2003.  2015.   HP polyps 2003.  adnomas 2015.  required referal to [facility] for  colonoscopic resection of flat polyps.   Marland Kitchen COPD (chronic obstructive pulmonary disease) ([place])   . Dementia   . Depression   . Depression with anxiety    takes Cymbalta daily  . Diabetes mellitus ([place])   . Dyspnea   . Early cataracts, bilateral   . Fibromyalgia   . GERD (gastroesophageal reflux disease)    was on meds but was taken off;now watches what she eats  . Hemorrhoids   . History of kidney stones   . Hx of colonic polyps   . Hyperlipemia    takes Crestor daily  . Hypertension    takes Amlodipine and Metoprolol daily  . Insomnia   . LBBB (left bundle branch block)    Stress test 09/03/2010, EF 55  . Myocardial infarction ([place])   . PAD (peripheral artery disease) ([facility])    Carotid, subclavian, and lower extremity beds, currently not symptomatic  . Presence of combination internal cardiac defibrillator (ICD) and pacemaker   . Presence of permanent cardiac pacemaker   . Pulmonary hypertension ([place])   . S/P angioplasty with stent, lt. subclavian 07/31/11 08/01/2011  . Subclavian arterial stenosis, lt, with PTA/STENT 07/31/11 08/01/2011  . Syncope 07/28/2011   EF - 50-55, moderate concentric hypertrophy in left ventricle  . Urinary incontinence   . Vertigo    takes Meclizine prn    Past Surgical History:  Procedure Laterality Date  . ABDOMINAL AORTAGRAM N/A 08/12/2013   Procedure: ABDOMINAL Maxcine [place];  Surgeon: Elam Dutch, MD;  Location: [facility] CATH LAB;  Service: Cardiovascular;  Laterality: N/A;  . ABDOMINAL HYSTERECTOMY    . APPENDECTOMY    . BACK SURGERY  2012  . BIV ICD GENERTAOR CHANGE OUT Left 02/20/2012   Procedure: BIV ICD GENERTAOR CHANGE OUT;  Surgeon: [name], MD;  Location: [facility] CATH LAB;  Service: Cardiovascular;  Laterality: Left;  . CARDIAC CATHETERIZATION  12/01/2007   By Dr. Melvern Banker, left heart cath,   . CARDIAC CATHETERIZATION N/A 05/20/2016   Procedure: Right/Left Heart Cath and Coronary Angiography;  Surgeon: Sherren Mocha, MD;  Location: [place]  CV LAB;  Service: Cardiovascular;  Laterality: N/A;  . CARDIAC CATHETERIZATION N/A 05/20/2016   Procedure: Coronary Stent Intervention;  Surgeon: Sherren Mocha, MD;  Location: [place] CV LAB;  Service: Cardiovascular;  Laterality: N/A;  . CARDIAC DEFIBRILLATOR PLACEMENT  05/2008   By Dr Blanch Media, Medtronic CANNOT HAVE MRI's  . CAROTID ANGIOGRAM N/A 07/31/2011   Procedure: CAROTID ANGIOGRAM;  Surgeon: Lorretta Harp, MD;  Location: [facility] CATH LAB;  Service: Cardiovascular;  Laterality: N/A;  carotid angiogram and possible Lt SCA PTA  . COLONOSCOPY W/ POLYPECTOMY  12/2013  . CORONARY ANGIOPLASTY    . ENDARTERECTOMY Left 11/08/2014   Procedure: LEFT CAROTID ENDARTERECTOMY WITH HEMASHIELD PATCH ANGIOPLASTY;  Surgeon: Elam Dutch, MD;  Location: [place];  Service: Vascular;  Laterality: Left;  . ESOPHAGOGASTRODUODENOSCOPY N/A 01/20/2014   Procedure: ESOPHAGOGASTRODUODENOSCOPY (EGD);  Surgeon: Jerene Bears, MD;  Location: [facility] ENDOSCOPY;  Service: Endoscopy;  Laterality: N/A;  .  FEMORAL-POPLITEAL BYPASS GRAFT Right 10/12/2013   Procedure:   Femoral-Peroneal trunk  bypass with nonreversed greater saphenous vein graft;  Surgeon: Elam Dutch, MD;  Location: [place];  Service: Vascular;  Laterality: Right;  . GIVENS CAPSULE STUDY N/A 01/20/2014   Procedure: GIVENS CAPSULE STUDY;  Surgeon: Jerene Bears, MD;  Location: [place];  Service: Gastroenterology;  Laterality: N/A;  . ICD GENERATOR CHANGEOUT N/A 03/04/2017   Procedure: ICD Generator Changeout;  Surgeon: [name], MD;  Location: [place] CV LAB;  Service: Cardiovascular;  Laterality: N/A;  . INSERT / REPLACE / REMOVE PACEMAKER    . INTRAOPERATIVE ARTERIOGRAM Right 10/12/2013   Procedure: INTRA OPERATIVE ARTERIOGRAM;  Surgeon: Elam Dutch, MD;  Location: [place];  Service: Vascular;  Laterality: Right;  . ORIF ELBOW FRACTURE  08/16/2011   Procedure: OPEN REDUCTION INTERNAL FIXATION (ORIF) ELBOW/OLECRANON FRACTURE;  Surgeon: Schuyler Amor, MD;  Location: [place];  Service: Orthopedics;  Laterality: Left;  . RENAL ANGIOGRAM N/A 08/12/2013   Procedure: RENAL ANGIOGRAM;  Surgeon: Elam Dutch, MD;  Location: [facility] CATH LAB;  Service: Cardiovascular;  Laterality: N/A;  . SUBCLAVIAN STENT PLACEMENT Left 07/31/2011   7x18 Genesis, balloon, with reduction of 90% ostial left subclavian artery stenosis to 0% with residual excellent flow  . TONSILLECTOMY    . TUBAL LIGATION      Current Medications: Outpatient Medications Prior to Visit  Medication Sig Dispense Refill  . ACCU-CHEK FASTCLIX LANCETS MISC USE   TO CHECK GLUCOSE ONCE DAILY 102 each 1  . ACCU-CHEK SMARTVIEW test strip CHECK BLOOD SUGAR ONCE DAILY 100 each 1  . acetaminophen (TYLENOL) 325 MG tablet Take 325 mg by mouth every 6 (six) hours as needed for mild pain.    Marland Kitchen albuterol (PROVENTIL) (2.5 MG/3ML) 0.083% nebulizer solution Take 3 mLs (2.5 mg total) by nebulization every 4 (four) hours as needed for wheezing or shortness of breath. 75 mL 12  . allopurinol (ZYLOPRIM) 300 MG tablet Take 1 tablet (300 mg total) by mouth daily. 30 tablet 0  . ALPRAZolam (XANAX) 0.5 MG tablet Take 2 tablets by mouth in the morning, and 3 tablets at night 150 tablet 5  . aspirin 81 MG chewable tablet Chew 1 tablet (81 mg total) by mouth daily. 30 tablet 1  . atorvastatin (LIPITOR) 80 MG tablet Take 1 tablet (80 mg total) by mouth daily at 6 PM. 90 tablet 3  . azelastine (ASTELIN) 0.1 % nasal spray Place 2 sprays into both nostrils 2 (two) times daily. Use in each nostril as directed 30 mL 12  . bisoprolol (ZEBETA) 5 MG tablet Take 1 tablet (5 mg total) by mouth daily. 90 tablet 3  . clopidogrel (PLAVIX) 75 MG tablet Take 1 tablet (75 mg total) by mouth daily. 90 tablet 3  . DULoxetine (CYMBALTA) 60 MG capsule TAKE ONE CAPSULE BY MOUTH TWICE DAILY (Patient taking differently: TAKE 60 mg  BY MOUTH TWICE DAILY) 60 capsule 11  . furosemide (LASIX) 80 MG tablet Take 2 tablets in the am and 1  tablet in the pm 90 tablet 11  . HYDROcodone-acetaminophen (NORCO) 10-325 MG tablet Take 1 tablet by mouth every 6 (six) hours as needed for severe pain. 120 tablet 0  . Magnesium Oxide 400 MG CAPS Take 1 capsule (400 mg total) by mouth 2 (two) times daily. 180 capsule 3  . nitroGLYCERIN (NITROSTAT) 0.4 MG SL tablet Place 0.4 mg under the tongue every 5 (five) minutes as needed for  chest pain. X 3 doses    . potassium chloride SA (K-DUR,KLOR-CON) 20 MEQ tablet Take 2 tablets (40 mEq total) by mouth 2 (two) times daily. 360 tablet 3  . PROAIR HFA 108 (90 Base) MCG/ACT inhaler Inhale 2 puffs into the lungs every 6 (six) hours as needed for wheezing or shortness of breath.     . traZODone (DESYREL) 50 MG tablet Take 1 tablet (50 mg total) by mouth at bedtime. (Patient taking differently: Take 50 mg by mouth at bedtime as needed for sleep. ) 90 tablet 3   No facility-administered medications prior to visit.      Allergies:   Potassium-containing compounds; Neomycin-polymyxin b gu; Azithromycin; Codeine; Darvon; Erythromycin; Meloxicam; Norco [hydrocodone-acetaminophen]; Penicillins; Propoxyphene n-acetaminophen; Rofecoxib; Rosuvastatin; Statins; and Sulfa antibiotics   Social History   Socioeconomic History  . Marital status: Widowed    Spouse name: Not on file  . Number of children: 3  . Years of education: 27  . Highest education level: Not on file  Occupational History  . Occupation: Retired  Scientific laboratory technician  . Financial resource strain: Not on file  . Food insecurity:    Worry: Not on file    Inability: Not on file  . Transportation needs:    Medical: Not on file    Non-medical: Not on file  Tobacco Use  . Smoking status: Former Smoker    Packs/day: 0.50    Years: 62.00    Pack years: 31.00    Types: Cigarettes    Last attempt to quit: 07/19/2017    Years since quitting: 0.2  . Smokeless tobacco: Never Used  Substance and Sexual Activity  . Alcohol use: No    Alcohol/week: 0.0 oz   . Drug use: No  . Sexual activity: Never    Birth control/protection: Surgical  Lifestyle  . Physical activity:    Days per week: Not on file    Minutes per session: Not on file  . Stress: Not on file  Relationships  . Social connections:    Talks on phone: Not on file    Gets together: Not on file    Attends religious service: Not on file    Active member of club or organization: Not on file    Attends meetings of clubs or organizations: Not on file    Relationship status: Not on file  Other Topics Concern  . Not on file  Social History Narrative   Ok to share information with medical POA, Son Sydney Phillips   Right-handed   Caffeine: Pepsi     Family History:  The patient's family history includes CAD in her father; Cancer in her sister; Colon cancer in her maternal grandmother; Deep vein thrombosis in her son; Diabetes in her sister; Heart disease in her father, mother, and sister; Hyperlipidemia in her father and unknown relative.   ROS:   Please see the history of present illness.    ROS All other systems reviewed and are negative.   PHYSICAL EXAM:   VS:  BP 133/65   Pulse 92   Ht 5' 5.5" (1.664 m)   Wt 185 lb 12.8 oz (84.3 kg)   SpO2 92%   BMI 30.45 kg/m      General: Alert, oriented x3, no distress, borderline obese.  Healthy subclavian defibrillator site Head: no evidence of trauma, PERRL, EOMI, no exophtalmos or lid lag, no myxedema, no xanthelasma; normal ears, nose and oropharynx Neck: normal jugular venous pulsations and no hepatojugular reflux; brisk  carotid pulses without delay and no carotid bruits Chest: clear to auscultation, no signs of consolidation by percussion or palpation, normal fremitus, symmetrical and full respiratory excursions Cardiovascular: normal position and quality of the apical impulse, regular rhythm, normal first and paradoxically split second heart sounds, no murmurs, rubs or gallops Abdomen: no tenderness or distention, no masses by  palpation, no abnormal pulsatility or arterial bruits, normal bowel sounds, no hepatosplenomegaly Extremities: no clubbing, cyanosis or edema; 2+ radial, ulnar and brachial pulses bilaterally; 2+ right femoral, posterior tibial and dorsalis pedis pulses; 2+ left femoral, posterior tibial and dorsalis pedis pulses; no subclavian or femoral bruits Neurological: grossly nonfocal Psych: Normal mood and affect   Wt Readings from Last 3 Encounters:  11/02/17 185 lb 12.8 oz (84.3 kg)  09/29/17 189 lb (85.7 kg)  09/15/17 191 lb (86.6 kg)      Studies/Labs Reviewed:   EKG:  EKG is not ordered today.  The intracardiac electrogram shows atrial sensed, biventricular paced rhythm  Recent Labs: 07/02/2017: ALT 16; TSH 3.89 07/19/2017: B Natriuretic Peptide 550.6 07/21/2017: Hemoglobin 13.7; Platelets 281 07/23/2017: Magnesium 2.3 07/30/2017: BUN 40; Creatinine, Ser 1.55; Potassium 5.1; Sodium 135   Lipid Panel    Component Value Date/Time   CHOL 183 07/02/2017 1120   TRIG 296.0 (H) 07/02/2017 1120   HDL 31.50 (L) 07/02/2017 1120   CHOLHDL 6 07/02/2017 1120   VLDL 59.2 (H) 07/02/2017 1120   LDLCALC 140 (H) 12/19/2016 0736   LDLDIRECT 115.0 07/02/2017 1120     ASSESSMENT:    1. Chronic combined systolic and diastolic heart failure ([place])   2. Atherosclerosis of native coronary artery of native heart without angina pectoris   3. Biventricular implantable cardioverter-defibrillator in situ   4. PAD (peripheral artery disease) ([place])   5. Hypercholesterolemia   6. Pulmonary emphysema, unspecified emphysema type ([place])      PLAN:  In order of problems listed above:  1. CHF: She has a narrow margin of compensation and has had problems with both hypervolemia/pulmonary edema and hypotension/renal insufficiency due to hypovolemia.  Best estimate of current "dry weight" is 180-190 pounds. Most recent EF around 35-40 % on echo from February 2019.  She is taking a highly selective beta blocker.  She is  not on RAA S inhibitors due to volatile renal function and the tendency to orthostatic hypotension and falls.   I asked her to call us promptly if her weight ever strays below 180 or above 190 pounds on her home scale. 2. CAD s/p DES to RCA and LCX Dec 2017: on ASA and clopidogrel, and I believe it is very reasonable to consider lifelong aggressive antiplatelet therapy based on her anatomy.  Ha not had any GI bleeding complications since the stents were placed more than a year ago, although this has been a problem in the more remote past. 3. CRT-D: Dermal device function.  She does monthly OptiVol downloads 3 months comprehensive downloads.  Plan office visit in 6 months. 4. PAD: History of left subclavian stent, should monitor blood pressure always in the right arm. History of left carotid endarterectomy, followed by Dr. Oneida Alar. Asymptomatic decrease in left lower extremity ABI. 5. HLP: Currently seems to be tolerating statin therapy in high dose. 6. COPD: Strongly encouraged to quit smoking again, but she does not want to commit to that.    Medication Adjustments/Labs and Tests Ordered: Current medicines are reviewed at length with the patient today.  Concerns regarding medicines are outlined above.  Medication  changes, Labs and Tests ordered today are listed in the Patient Instructions below. Patient Instructions  Dr Sallyanne Kuster recommends that you continue on your current medications as directed. Please refer to the Current Medication list given to you today.  Remote monitoring is used to monitor your Pacemaker or ICD from home. This monitoring reduces the number of office visits required to check your device to one time per year. It allows [place] to keep an eye on the functioning of your device to ensure it is working properly. You are scheduled for a device check from home on Tuesday, May 28th, 2019. You may send your transmission at any time that day. If you have a wireless device, the transmission  will be sent automatically. After your physician reviews your transmission, you will receive a postcard with your next transmission date.  To improve our patient care and to more adequately follow your device, [facility] has decided, as a practice, to start following each patient four times a year with your home monitor. This means that you may experience a remote appointment that is close to an in-office appointment with your physician. Your insurance will apply at the same rate as other remote monitoring transmissions.  Dr Sallyanne Kuster recommends that you schedule a follow-up appointment in 6 months with an ICD check. You will receive a reminder letter in the mail two months in advance. If you don't receive a letter, please call our office to schedule the follow-up appointment.  If you need a refill on your cardiac medications before your next appointment, please call your pharmacy.    Signed, [name], MD  11/03/2017 6:18 PM    [facility] [place], [place], [place]  [address] Phone: [phone]; Fax: [phone]

## 2017-11-03 ENCOUNTER — Encounter: Payer: Self-pay | Admitting: Cardiovascular Disease

## 2017-11-04 ENCOUNTER — Telehealth: Payer: Self-pay | Admitting: Family Medicine

## 2017-11-04 ENCOUNTER — Encounter: Payer: Self-pay | Admitting: Family Medicine

## 2017-11-04 ENCOUNTER — Ambulatory Visit (INDEPENDENT_AMBULATORY_CARE_PROVIDER_SITE_OTHER): Payer: Medicare Other | Admitting: Family Medicine

## 2017-11-04 VITALS — BP 122/58 | HR 84 | Temp 98.2°F | Ht 65.5 in | Wt 187.6 lb

## 2017-11-04 DIAGNOSIS — J439 Emphysema, unspecified: Secondary | ICD-10-CM

## 2017-11-04 DIAGNOSIS — J209 Acute bronchitis, unspecified: Secondary | ICD-10-CM | POA: Diagnosis not present

## 2017-11-04 DIAGNOSIS — J440 Chronic obstructive pulmonary disease with acute lower respiratory infection: Secondary | ICD-10-CM

## 2017-11-04 MED ORDER — HYDROCODONE-HOMATROPINE 5-1.5 MG/5ML PO SYRP: 5.0000 mL | ORAL_SOLUTION | ORAL | 0 refills | Status: DC | PRN

## 2017-11-04 MED ORDER — LEVOFLOXACIN 500 MG PO TABS: 500.0000 mg | ORAL_TABLET | Freq: Every day | ORAL | 0 refills | Status: AC

## 2017-11-04 NOTE — Telephone Encounter (Signed)
Copied from [place] [id number]. Topic: Quick Communication - See Telephone Encounter >> Nov 04, 2017  4:16 PM Cleaster Corin, NT wrote: CRM for notification. See Telephone encounter for: 11/04/17.  Pt. Calling about new rx. For ALPRAZolam (XANAX) 0.5 MG tablet [[id number]] taking 1 tablet every six hours. Pt. States that she needs med. Today and she has been waiting to get a call back.   [place], [place] - [place] [address] Phone: [phone] Fax: [phone]

## 2017-11-04 NOTE — Progress Notes (Signed)
   Subjective:    Patient ID: Sydney Phillips, female    DOB: 12-Apr-1936, 82 y.o.   MRN: [id number]  HPI Here for 2 weeks of chest tightness and wheezing, now she is coughing up green sputum. No fever. On Mucinex. She saw her Cardiologist yesterday and she got a good report. Her son, who she lives with, has had a bronchitis for several weeks.    Review of Systems  Constitutional: Negative.   HENT: Negative.   Eyes: Negative.   Respiratory: Positive for cough, chest tightness, shortness of breath and wheezing.   Cardiovascular: Negative.        Objective:   Physical Exam  Constitutional: She appears well-developed and well-nourished.  Cardiovascular: Normal rate.  Pulmonary/Chest: Effort normal. She has no rales.  Scattered rhonchi and wheezes           Assessment & Plan:  Bronchitis, she is given a nebulizer treatment. She will start on Levaquin.  Alysia Penna, MD

## 2017-11-04 NOTE — Telephone Encounter (Signed)
On 10/08/17 pt called in for refill of xanax and stated: she is taking 1 tablet in the morning and 2 at night. Rx from 08/11/17 had directions of 1 tab q6h prn. She picked up refill script 10/12/17 for #120 with directions for 1 tab q6h prn.  New rx completed 10/13/17 for #150 with directions for 5 tabs per day, 2 qam and 3 qhs.There are no notes in chart indicating reason for change. This rx has been put on hold since pt had just picked up #120 the day before.   Per pharmacy insurance will not cover 5 tabs per day without PA completion. Pt is also asking to pick up rx today as she is out of medication.   Dr. Sarajane Jews - Please advise. Thanks!

## 2017-11-05 NOTE — Telephone Encounter (Signed)
We called the pharmacy and straightened this out. She will stay on the QID dosing.

## 2017-11-09 DIAGNOSIS — J449 Chronic obstructive pulmonary disease, unspecified: Secondary | ICD-10-CM | POA: Diagnosis not present

## 2017-11-10 ENCOUNTER — Ambulatory Visit (INDEPENDENT_AMBULATORY_CARE_PROVIDER_SITE_OTHER): Payer: Medicare Other | Admitting: *Deleted

## 2017-11-10 DIAGNOSIS — I255 Ischemic cardiomyopathy: Secondary | ICD-10-CM | POA: Diagnosis not present

## 2017-11-11 NOTE — Progress Notes (Signed)
Remote ICD transmission.   

## 2017-11-12 LAB — CUP PACEART REMOTE DEVICE CHECK
Battery Remaining Longevity: 70 mo
Battery Voltage: 2.99 V
Brady Statistic AP VP Percent: 1.32 %
Brady Statistic AP VS Percent: 0.09 %
Brady Statistic AS VP Percent: 97.91 %
Brady Statistic AS VS Percent: 0.68 %
Brady Statistic RA Percent Paced: 1.41 %
Brady Statistic RV Percent Paced: 98.66 %
Date Time Interrogation Session: 20190528202721
HighPow Impedance: 54 Ohm
HighPow Impedance: 62 Ohm
Implantable Lead Implant Date: 20091202
Implantable Lead Implant Date: 20091202
Implantable Lead Implant Date: 20091202
Implantable Lead Location: 753858
Implantable Lead Location: 753859
Implantable Lead Location: 753860
Implantable Lead Model: 185
Implantable Lead Model: 4194
Implantable Lead Model: 4470
Implantable Lead Serial Number: 308680
Implantable Lead Serial Number: 635533
Implantable Pulse Generator Implant Date: 20180919
Lead Channel Impedance Value: 304 Ohm
Lead Channel Impedance Value: 399 Ohm
Lead Channel Impedance Value: 456 Ohm
Lead Channel Impedance Value: 589 Ohm
Lead Channel Impedance Value: 779 Ohm
Lead Channel Impedance Value: 817 Ohm
Lead Channel Pacing Threshold Amplitude: 0.625 V
Lead Channel Pacing Threshold Amplitude: 0.625 V
Lead Channel Pacing Threshold Amplitude: 2.125 V
Lead Channel Pacing Threshold Pulse Width: 0.4 ms
Lead Channel Pacing Threshold Pulse Width: 0.4 ms
Lead Channel Pacing Threshold Pulse Width: 1 ms
Lead Channel Sensing Intrinsic Amplitude: 28.125 mV
Lead Channel Sensing Intrinsic Amplitude: 28.125 mV
Lead Channel Sensing Intrinsic Amplitude: 3.75 mV
Lead Channel Sensing Intrinsic Amplitude: 3.75 mV
Lead Channel Setting Pacing Amplitude: 1.5 V
Lead Channel Setting Pacing Amplitude: 2 V
Lead Channel Setting Pacing Amplitude: 2.5 V
Lead Channel Setting Pacing Pulse Width: 0.4 ms
Lead Channel Setting Pacing Pulse Width: 1 ms
Lead Channel Setting Sensing Sensitivity: 0.3 mV

## 2017-11-13 ENCOUNTER — Encounter: Payer: Self-pay | Admitting: Cardiology

## 2017-11-25 DIAGNOSIS — J449 Chronic obstructive pulmonary disease, unspecified: Secondary | ICD-10-CM | POA: Diagnosis not present

## 2017-12-02 ENCOUNTER — Telehealth: Payer: Self-pay

## 2017-12-02 ENCOUNTER — Other Ambulatory Visit: Payer: Self-pay

## 2017-12-02 MED ORDER — ALPRAZOLAM 0.5 MG PO TABS: ORAL_TABLET | ORAL | 5 refills | Status: DC

## 2017-12-02 MED ORDER — ALPRAZOLAM 1 MG PO TABS: 1.0000 mg | ORAL_TABLET | Freq: Two times a day (BID) | ORAL | 5 refills | Status: DC | PRN

## 2017-12-02 NOTE — Telephone Encounter (Signed)
Copied from [employer] [id number]. Topic: Quick Communication - Rx Refill/Question >> Dec 02, 2017  2:21 PM Waldemar Dickens, Sade R wrote: Medication: ALPRAZolam Duanne Moron) 1 MG tablet  Has the patient contacted their pharmacy?Yes (Agent: If no, request that the patient contact the pharmacy for the refill.) (Agent: If yes, when and what did the pharmacy advise?)  Preferred Pharmacy (with phone number or street name):[facility] [place], [place] - [place] [phone] (Phone) [phone] (Fax)      Agent: Please be advised that RX refills may take up to 3 business days. We ask that you follow-up with your pharmacy.

## 2017-12-02 NOTE — Telephone Encounter (Signed)
Called and spoke with pt. Rx was re-called in today for the 1 MG dose was called in earlier for 0.5 MG dose. Pt has been advised that this has been done. Best number to reach pt is at [phone].  Dr. Sarajane Jews gave me the OK to call in Rx for 1 MG xanax take 1 tablet in the morning and 1.5 tablets at night disp 75 with 5 refills.

## 2017-12-02 NOTE — Telephone Encounter (Signed)
Fax from [employer]  @ [address]   Regarding xanax   [employer] will NOT cover 150 tablets- Max. Dose of 4 tablets  * can you resend w/ the following directions to try**  Xanax 1 MG disp 75, take 1 tablet by mouth in the morning, 1.5 tablets in the evening.   Thanks   Sent TO PCP for approval

## 2017-12-02 NOTE — Telephone Encounter (Signed)
Medication was refilled today.   Called pt twice and was unable to leave a VM to call back.

## 2017-12-02 NOTE — Telephone Encounter (Signed)
Agreed. Stop the Xanax 0.5 mg. Call in Xanax 1 mg to take 1 tab in the mornings and 1.5 tabs in the evenings, #75 with 5 rf

## 2017-12-02 NOTE — Telephone Encounter (Signed)
Rx has been called into pt's pharmacy.  

## 2017-12-02 NOTE — Telephone Encounter (Signed)
Error in Rx called earlier dose should have been for 1 MG and NOT 0.5 MG   Re called in Rx for xanax 1 MG   Disp 75 with 5 refills   Take 1 tablet in the morning, and 1/5 tablets at night.   Number listed in Not working for me to contact pt. Called the pharmacy the number they have on file is [phone]

## 2017-12-03 ENCOUNTER — Ambulatory Visit (INDEPENDENT_AMBULATORY_CARE_PROVIDER_SITE_OTHER): Payer: Medicare Other

## 2017-12-03 DIAGNOSIS — I5042 Chronic combined systolic (congestive) and diastolic (congestive) heart failure: Secondary | ICD-10-CM | POA: Diagnosis not present

## 2017-12-03 DIAGNOSIS — Z95810 Presence of automatic (implantable) cardiac defibrillator: Secondary | ICD-10-CM

## 2017-12-03 NOTE — Progress Notes (Signed)
EPIC Encounter for ICM Monitoring  Patient Name: SAMORA JERNBERG is a 82 y.o. female Date: 12/03/2017 Primary Care Physican: Laurey Morale, MD Primary Cardiologist:Croitoru Electrophysiologist: Croitoru DryWeight: Previous weight 183 lbs(ranges from 180-190 lbs) Bi-V Pacing: 99.1%      Attempted call to patient and unable to reach.  Left detailed message, per DPR, regarding transmission.  Transmission reviewed.    Thoracic impedance close to baseline but was abnormal suggesting fluid accumulation from 11/25/2017 - 11/30/2017.  Prescribed dosage: Furosemide 80 mg 2 tablets every morning and 1 tablet in the evening.Potassium 20 mEq 2tablets (40 mEq total)twice a day.  Labs: 07/30/2017 Creatinine1.55, BUN40, Potassium5.1, Sodium135 07/23/2017 Creatinine1.17, BUN34, Potassium3.8, Sodium138, [id number]  07/22/2017 Creatinine1.20, BUN35, Potassium4.4, Sodium138, [id number]  07/21/2017 Creatinine1.30, BUN31, Potassium4.5, Sodium136, [id number]  07/20/2017 Creatinine1.25, BUN26, Potassium5.4, JOINOM767, [id number]  07/19/2017 Creatinine1.00, BUN22, Potassium4.2, Sodium140  07/18/2017 Creatinine1.14, BUN26, Potassium3.7, [id number], [id number]  07/02/2017 Creatinine1.09, BUN17, Potassium3.2, [id number] 04/06/2017 Creatinine 1.01, BUN 14, Potassium 2.7, Sodium 140, EGFR 52-60 02/25/2017 Creatinine 1.14, BUN 18, Potassium 4.7, Sodium 141, EGFR 46-53 02/17/2017 Creatinine 1.15, BUN 9, Potassium 2.8, Sodium 140, EGFR 44-51 01/08/2017 Creatinine 1.28, BUN 20, Potassium 4.9, Sodium 143, EGFR 40-46 01/02/2017 Creatinine 1.61, BUN 48, Potassium 5.5, Sodium 138, EGFR 30-35  12/20/2016 Creatinine 1.42, BUN 33, Potassium 5.0, Sodium 139, EGFR 34-40  12/19/2016 Creatinine 1.06, BUN 24, Potassium 4.7, Sodium 139, EGFR 49-57  12/18/2016 Creatinine 1.32, BUN 20, Potassium 3.4, Sodium 138, EGFR 38-44  12/17/2016 Creatinine 1.09, BUN 9, Potassium 2.4, Sodium  141, EGFR 47-55 A complete set of results can be found in Results Review  Recommendations: Left voice mail with ICM number and encouraged to call if experiencing any fluid symptoms.  Follow-up plan: ICM clinic phone appointment on 01/04/2018.    Copy of ICM check sent to Dr. Sallyanne Kuster.   3 month ICM trend: 12/03/2017    1 Year ICM trend:       [name], RN 12/03/2017 11:15 AM

## 2017-12-08 ENCOUNTER — Telehealth: Payer: Self-pay

## 2017-12-08 NOTE — Telephone Encounter (Signed)
   [facility] Pre-operative Risk Assessment    Request for surgical clearance:  1. What type of surgery is being performed? CATARACT EXTRACTION by PE,IOL-LEFT UNDER IC SEDATION  2. When is this surgery scheduled? 12-24-2017   3. What type of clearance is required (medical clearance vs. Pharmacy clearance to hold med vs. Both)? BOTH  4. Are there any medications that need to be held prior to surgery and how long? PLAVIX, FUROSEMIDE,KLORCON, ATORVASTATIN, BISOPROLOL AND O2 _0    5. Practice name and name of physician performing surgery?   [facility]   6. What is your office phone number [phone]    7.   What is your office fax number [phone]  8.   Anesthesia type (None, local, MAC, general) ? SAYS IV SEDATION   Sydney Phillips 12/08/2017, 1:59 PM  _________________________________________________________________   (provider comments below)

## 2017-12-08 NOTE — Telephone Encounter (Signed)
   Primary Cardiologist: [name], MD  Chart reviewed as part of pre-operative protocol coverage. Cataract extractions are recognized in guidelines as low risk surgeries that do not typically require preoperative testing or holding of blood thinner therapy. We do not typically interrupt standard medications for this either. Therefore, given past medical history and time since last visit, based on ACC/AHA guidelines, Sydney Phillips would be at acceptable risk for the planned procedure without further cardiovascular testing.   Of note, FYI per Dr. Sallyanne Kuster, patient has "History of left subclavian stent, should monitor blood pressure always in the right arm."  I will route this recommendation to the requesting party via Epic fax function and remove from pre-op pool.  Please call with questions.  Charlie Pitter, PA-C 12/08/2017, 5:02 PM

## 2017-12-10 DIAGNOSIS — J449 Chronic obstructive pulmonary disease, unspecified: Secondary | ICD-10-CM | POA: Diagnosis not present

## 2017-12-24 DIAGNOSIS — H2512 Age-related nuclear cataract, left eye: Secondary | ICD-10-CM | POA: Diagnosis not present

## 2017-12-24 DIAGNOSIS — H25812 Combined forms of age-related cataract, left eye: Secondary | ICD-10-CM | POA: Diagnosis not present

## 2018-01-04 ENCOUNTER — Ambulatory Visit (INDEPENDENT_AMBULATORY_CARE_PROVIDER_SITE_OTHER): Payer: Medicare Other

## 2018-01-04 DIAGNOSIS — I5042 Chronic combined systolic (congestive) and diastolic (congestive) heart failure: Secondary | ICD-10-CM | POA: Diagnosis not present

## 2018-01-04 DIAGNOSIS — Z95810 Presence of automatic (implantable) cardiac defibrillator: Secondary | ICD-10-CM

## 2018-01-04 NOTE — Progress Notes (Signed)
EPIC Encounter for ICM Monitoring  Patient Name: Sydney Phillips is a 81 y.o. female Date: 01/04/2018 Primary Care Physican: Fry, Stephen A, MD Primary Cardiologist:Croitoru Electrophysiologist: Croitoru DryWeight: Previous weight 183lbs(ranges from 180-190 lbs) Bi-V Pacing: 99.1%  Clinical Status (03-Dec-2017 to 04-Jan-2018)  Treated VT/VF 0 episodes   AT/AF 0 episodes   Time in AT/AF <0.1 hr/day (<0.1%)  Observations (3) (03-Dec-2017 to 04-Jan-2018)  CRT Pacing is effective less than 90% of the time.  Night heart rate over 85 bpm for 7 days.  Patient Activity less than 1 hr/day for 4 weeks.      Attempted call to patient and unable to reach.  Left detailed message, per DPR, regarding transmission.  Transmission reviewed.    Thoracic impedance close to normal today but showing significant valleys below baseline suggesting fluid accumulation in the last month.  Prescribed dosage: Furosemide 80 mg2 tablets every morning and 1 tablet in the evening.Potassium 20 mEq 2tablets (40 mEq total)twice a day.  Labs: 07/30/2017 Creatinine1.55, BUN40, Potassium5.1, Sodium135 07/23/2017 Creatinine1.17, BUN34, Potassium3.8, Sodium138, EGFR43-50  07/22/2017 Creatinine1.20, BUN35, Potassium4.4, Sodium138, EGFR42-48  07/21/2017 Creatinine1.30, BUN31, Potassium4.5, Sodium136, EGFR38-44  07/20/2017 Creatinine1.25, BUN26, Potassium5.4, Sodium137, EGFR40-46  07/19/2017 Creatinine1.00, BUN22, Potassium4.2, Sodium140  07/18/2017 Creatinine1.14, BUN26, Potassium3.7, Sodium142, EGFR45-52  07/02/2017 Creatinine1.09, BUN17, Potassium3.2, Sodium141       A complete set of results can be found in Results Review  Recommendations: Left voice mail with ICM number and encouraged to call if experiencing any fluid symptoms.  Follow-up plan: ICM clinic phone appointment on 02/09/2018.    Copy of ICM check sent to Dr. Croitoru.   3 month ICM trend:  01/04/2018    1 Year ICM trend:       Laurie S Short, RN 01/04/2018 10:11 AM   

## 2018-01-05 ENCOUNTER — Telehealth: Payer: Self-pay

## 2018-01-05 NOTE — Telephone Encounter (Signed)
Remote ICM transmission received.  Attempted call to patient and left detailed message, per DPR, regarding transmission and next ICM scheduled for 02/09/2018.  Advised to return call for any fluid symptoms or questions.

## 2018-01-05 NOTE — Progress Notes (Signed)
Patient returned call.  She stated she has drinking too much fluids because it has been hot outside.  She will cut back on fluid intake.  She stated she feels fine at this time.

## 2018-01-09 DIAGNOSIS — J449 Chronic obstructive pulmonary disease, unspecified: Secondary | ICD-10-CM | POA: Diagnosis not present

## 2018-01-15 ENCOUNTER — Other Ambulatory Visit: Payer: Self-pay | Admitting: Family Medicine

## 2018-01-18 NOTE — Telephone Encounter (Signed)
Last filled by Dia Crawford, MD on 07/23/17 Last OV 11/04/17  Ok to fill?

## 2018-01-29 ENCOUNTER — Ambulatory Visit: Payer: Self-pay | Admitting: *Deleted

## 2018-01-29 NOTE — Telephone Encounter (Signed)
Patient called in to report BP reading 142/84. I advised of the note by Dr. Sarajane Jews to increase bisoprolol to 10 mg daily, she verbalized understanding and asks is it ok to take it at 6 pm every day with her cholesterol medication, because she wakes up at different times throughout the day. I advised as long as it is around the same time every day, that will be fine to take at 6 pm. She asks does she need to go ahead and take another bisoprolol today to equal the 10 mg, I advised to go ahead and take 1 more. She asked for an appointment on Monday, appointment scheduled for Monday at 1530 with Dr. Sarajane Jews.

## 2018-01-29 NOTE — Telephone Encounter (Signed)
Tell her to double up on the Bisoprolol to take 2 tabs a day (total of 10 mg) and contact us next week

## 2018-01-29 NOTE — Telephone Encounter (Signed)
Sent to PCP to advise 

## 2018-01-29 NOTE — Telephone Encounter (Signed)
Pt advised and voiced understanding.   

## 2018-01-29 NOTE — Telephone Encounter (Signed)
The pt states that her blood pressure was "100/195" yesterday; (verified this number with the pt)  today is 176/113, HR 93 and her oxygen is 91%; these values were taken today at 1435; she also says she takes a blood pressure medicine; currently she feels nervous because she doesn't like these numbers; she states that she has not taken her blood pressure medicine today; she uses a wrist cuff on the right; at 1513 it reads 156/97; recommendations made per nurse triage protocol to include; she verbalizes understanding and will have [name] recheck her blood pressure; will route to office for notification of this encounter.   Reason for Disposition . [9] Systolic BP  >= 038 OR Diastolic >= 333 AND [8] missed most recent dose of blood pressure medication  Answer Assessment - Initial Assessment Questions 1. BLOOD PRESSURE: "What is the blood pressure?" "Did you take at least two measurements 5 minutes apart?"     176/113 and 156/97 2. ONSET: "When did you take your blood pressure?"     01/29/18 at 1435 and 1513 3. HOW: "How did you obtain the blood pressure?" (e.g., visiting nurse, automatic home BP monitor)     Automatic cuff used on right wrist 4. HISTORY: "Do you have a history of high blood pressure?"     yes 5. MEDICATIONS: "Are you taking any medications for blood pressure?" "Have you missed any doses recently?"     Yes; has not taken today's dose of medication 6. OTHER SYMPTOMS: "Do you have any symptoms?" (e.g., headache, chest pain, blurred vision, difficulty breathing, weakness)     Nervous because she doesn't like these numbers 7. PREGNANCY: "Is there any chance you are pregnant?" "When was your last menstrual period?"     no  Protocols used: HIGH BLOOD PRESSURE-A-AH

## 2018-02-01 ENCOUNTER — Encounter: Payer: Self-pay | Admitting: Family Medicine

## 2018-02-01 ENCOUNTER — Ambulatory Visit (INDEPENDENT_AMBULATORY_CARE_PROVIDER_SITE_OTHER): Payer: Medicare Other | Admitting: Family Medicine

## 2018-02-01 VITALS — BP 140/76 | HR 81 | Temp 98.3°F | Ht 65.5 in | Wt 191.4 lb

## 2018-02-01 DIAGNOSIS — R531 Weakness: Secondary | ICD-10-CM | POA: Diagnosis not present

## 2018-02-01 DIAGNOSIS — N183 Chronic kidney disease, stage 3 unspecified: Secondary | ICD-10-CM

## 2018-02-01 DIAGNOSIS — J439 Emphysema, unspecified: Secondary | ICD-10-CM | POA: Diagnosis not present

## 2018-02-01 DIAGNOSIS — E118 Type 2 diabetes mellitus with unspecified complications: Secondary | ICD-10-CM

## 2018-02-01 DIAGNOSIS — I10 Essential (primary) hypertension: Secondary | ICD-10-CM

## 2018-02-01 NOTE — Progress Notes (Signed)
   Subjective:    Patient ID: Sydney Phillips, female    DOB: 12-12-1935, 82 y.o.   MRN: [id number]  HPI Complaining of generalized fatigue and of muscle weakness. This started about a month ago and has gotten a little worse since then. She cannot stand for any length of time before she has to sit down. She uses her walker everywhere she goes but even then she can only walk for short distances. No chest pain or SOB. No unusaul feet or ankle swelling. No muscle pains. Of note her Lipitor was increased to 80 mg a day last February when she was in the hospital.   Review of Systems  Constitutional: Positive for fatigue.  Respiratory: Negative.   Cardiovascular: Negative.   Gastrointestinal: Negative.   Neurological: Positive for weakness. Negative for numbness.       Objective:   Physical Exam  Constitutional: She is oriented to person, place, and time.  Alert, walks slowly with her walker   Neck: No thyromegaly present.  Cardiovascular: Normal rate, regular rhythm, normal heart sounds and intact distal pulses.  Pulmonary/Chest: Effort normal and breath sounds normal. No stridor. No respiratory distress. She has no wheezes. She has no rales.  Lymphadenopathy:    She has no cervical adenopathy.  Neurological: She is alert and oriented to person, place, and time. No cranial nerve deficit. She exhibits normal muscle tone. Coordination normal.          Assessment & Plan:  Her HTN is well controlled. Her weakness may well be related to the higher dosing of her statin. I advised her to stop taking the Lipitor for one month to see if this improved. Get labs today to check renal function, etc.  Alysia Penna, MD

## 2018-02-02 LAB — BASIC METABOLIC PANEL
BUN: 17 mg/dL (ref 6–23)
CHLORIDE: 100 meq/L (ref 96–112)
CO2: 30 mEq/L (ref 19–32)
CREATININE: 1.22 mg/dL — AB (ref 0.40–1.20)
Calcium: 9.8 mg/dL (ref 8.4–10.5)
GFR: 45.07 mL/min — ABNORMAL LOW (ref >60.00)
Glucose, Bld: 121 mg/dL — ABNORMAL HIGH (ref 70–99)
POTASSIUM: 3.4 meq/L — AB (ref 3.5–5.1)
SODIUM: 140 meq/L (ref 135–145)

## 2018-02-02 LAB — CBC WITH DIFFERENTIAL/PLATELET
BASOS PCT: 1 % (ref 0.0–3.0)
Basophils Absolute: 0.1 10*3/uL (ref 0.0–0.1)
EOS ABS: 0.3 10*3/uL (ref 0.0–0.7)
Eosinophils Relative: 3.2 % (ref 0.0–5.0)
HEMATOCRIT: 44.7 % (ref 36.0–46.0)
HEMOGLOBIN: 14.7 g/dL (ref 12.0–15.0)
LYMPHS PCT: 19 % (ref 12.0–46.0)
Lymphs Abs: 1.9 10*3/uL (ref 0.7–4.0)
MCHC: 32.9 g/dL (ref 30.0–36.0)
MCV: 98.7 fl (ref 78.0–100.0)
MONOS PCT: 8.6 % (ref 3.0–12.0)
Monocytes Absolute: 0.9 10*3/uL (ref 0.1–1.0)
NEUTROS ABS: 6.8 10*3/uL (ref 1.4–7.7)
Neutrophils Relative %: 68.2 % (ref 43.0–77.0)
PLATELETS: 267 10*3/uL (ref 150.0–400.0)
RBC: 4.53 Mil/uL (ref 3.87–5.11)
RDW: 16.6 % — ABNORMAL HIGH (ref 11.5–15.5)
WBC: 9.9 10*3/uL (ref 4.0–10.5)

## 2018-02-02 LAB — HEPATIC FUNCTION PANEL
ALT: 19 U/L (ref 0–35)
AST: 20 U/L (ref 0–37)
Albumin: 4.3 g/dL (ref 3.5–5.2)
Alkaline Phosphatase: 96 U/L (ref 39–117)
BILIRUBIN DIRECT: 0.1 mg/dL (ref 0.0–0.3)
BILIRUBIN TOTAL: 0.7 mg/dL (ref 0.2–1.2)
TOTAL PROTEIN: 7.1 g/dL (ref 6.0–8.3)

## 2018-02-02 LAB — TSH: TSH: 2.79 u[IU]/mL (ref 0.35–4.50)

## 2018-02-02 LAB — HEMOGLOBIN A1C: HEMOGLOBIN A1C: 8.1 % — AB (ref 4.6–6.5)

## 2018-02-05 ENCOUNTER — Other Ambulatory Visit: Payer: Self-pay

## 2018-02-05 MED ORDER — METFORMIN HCL 500 MG PO TABS: 500.0000 mg | ORAL_TABLET | Freq: Two times a day (BID) | ORAL | 11 refills | Status: DC

## 2018-02-09 ENCOUNTER — Ambulatory Visit (INDEPENDENT_AMBULATORY_CARE_PROVIDER_SITE_OTHER): Payer: Medicare Other

## 2018-02-09 ENCOUNTER — Ambulatory Visit (INDEPENDENT_AMBULATORY_CARE_PROVIDER_SITE_OTHER): Payer: Medicare Other | Admitting: *Deleted

## 2018-02-09 ENCOUNTER — Telehealth: Payer: Self-pay

## 2018-02-09 DIAGNOSIS — I5042 Chronic combined systolic (congestive) and diastolic (congestive) heart failure: Secondary | ICD-10-CM

## 2018-02-09 DIAGNOSIS — Z95810 Presence of automatic (implantable) cardiac defibrillator: Secondary | ICD-10-CM

## 2018-02-09 DIAGNOSIS — I255 Ischemic cardiomyopathy: Secondary | ICD-10-CM

## 2018-02-09 DIAGNOSIS — J449 Chronic obstructive pulmonary disease, unspecified: Secondary | ICD-10-CM | POA: Diagnosis not present

## 2018-02-09 NOTE — Progress Notes (Signed)
Remote ICD transmission.   

## 2018-02-09 NOTE — Progress Notes (Signed)
EPIC Encounter for ICM Monitoring  Patient Name: Sydney Phillips is a 82 y.o. female Date: 02/09/2018 Primary Care Physican: Laurey Morale, MD Primary Cardiologist:Croitoru Electrophysiologist: Croitoru DryWeight:Previous weight183lbs(ranges from 180-190 lbs)  Clinical Status (04-Jan-2018 to 09-Feb-2018) Bi-V Pacing: 92.8% Effective 77.7%      Attempted call to patient and unable to reach.  Left detailed message, per DPR, regarding transmission.  Transmission reviewed.    Thoracic impedance close to baseline but had days of decreased impedance suggesting fluid accumulation in the last month.  Prescribed dosage: Furosemide 80 mg2 tablets every morning and 1 tablet in the evening.Potassium 20 mEq 2tablets (40 mEq total)twice a day.  Labs: 02/01/2018 Creatinine 1.22, BUN 17, Potassium 3.4, Sodium 140, EGFR 45.07 07/30/2017 Creatinine1.55, BUN40, Potassium5.1, Sodium135 07/23/2017 Creatinine1.17, BUN34, Potassium3.8, [id number], [id number]  07/22/2017 Creatinine1.20, BUN35, Potassium4.4, CMKLKJ179, [id number]  07/21/2017 Creatinine1.30, BUN31, Potassium4.5, Sodium136, [id number]  07/20/2017 Creatinine1.25, BUN26, Potassium5.4, ZSMOLM786, LJQG92-01  07/19/2017 Creatinine1.00, BUN22, Potassium4.2, Sodium140  07/18/2017 Creatinine1.14, BUN26, Potassium3.7, [id number], OITG54-98  07/02/2017 Creatinine1.09, BUN17, Potassium3.2, Sodium141       A complete set of results can be found in Results Review  Recommendations:  Left voice mail with ICM number and encouraged to call if experiencing any fluid symptoms.  Follow-up plan: ICM clinic phone appointment on 03/15/2018.   Office appointment scheduled 05/13/2018 with Dr. Sallyanne Kuster.    Copy of ICM check sent to Dr. Sallyanne Kuster.   3 month ICM trend: 02/09/2018    1 Year ICM trend:       [name], RN 02/09/2018 11:56 AM

## 2018-02-09 NOTE — Telephone Encounter (Signed)
Remote ICM transmission received.  Attempted call to patient and left detailed message, per DPR, regarding transmission and next ICM scheduled for 03/15/2018.  Advised to return call for any fluid symptoms or questions.    

## 2018-02-10 ENCOUNTER — Ambulatory Visit: Payer: Self-pay | Admitting: *Deleted

## 2018-02-10 NOTE — Telephone Encounter (Signed)
Noted, Phone note has already been routed to provider.

## 2018-02-10 NOTE — Telephone Encounter (Signed)
Pt called with thinking she is having symptoms from taking a new medication for her, metformin. She is stating that she sleeps all the time, has no energy, bones hurt, and just don't feel good.  She was in the office on the 19 th with having fatigue and muscle weakness.  She went to bed last night and did not get up until about 3:30 today.  She thinks the metformin is causing is causing some of this. Her blood sugar yesterday was 158 and her blood sugar right now is 213. Explained to patient that stress will cause her blood sugar to be elevated. Also she has not eaten anything today. Advised to go ahead and eat and start drinking fluids, esp water. She dislikes water. Suggested flavoring it with fruit or vegetables (cucumber slices). She voiced understanding. Advised for increase in symptoms to go to the ED.  Will notify flow at  [facility] . Pt requesting a call back regarding Metformin.  Reason for Disposition . Caller has URGENT medication question about med that PCP prescribed and triager unable to answer question  Answer Assessment - Initial Assessment Questions 1. SYMPTOMS: "Do you have any symptoms?"     Fatigue, bones hurt, dry mouth, just don't feel good 2. SEVERITY: If symptoms are present, ask "Are they mild, moderate or severe?"     Moderate to severe  Protocols used: MEDICATION QUESTION CALL-A-AH

## 2018-02-11 ENCOUNTER — Encounter: Payer: Self-pay | Admitting: Cardiology

## 2018-02-11 NOTE — Progress Notes (Signed)
Letter  

## 2018-02-22 DIAGNOSIS — Z0279 Encounter for issue of other medical certificate: Secondary | ICD-10-CM

## 2018-02-23 ENCOUNTER — Telehealth: Payer: Self-pay | Admitting: Family Medicine

## 2018-02-23 NOTE — Telephone Encounter (Signed)
FMLA paperwork to be filled out, placed in dr's folder.  Sent paperwork back to dr 1 day after it was received due to patient's son needing to fill out the employee part.  Fax to [phone], Attn: Randol Kern upon completion

## 2018-02-24 NOTE — Telephone Encounter (Signed)
I have not received this yet Tammy.

## 2018-02-24 NOTE — Telephone Encounter (Signed)
The form was in Dr Barbie Banner folder this morning when Ms Margarrett brought them around.

## 2018-02-24 NOTE — Telephone Encounter (Signed)
Yes we do NOT have it

## 2018-02-24 NOTE — Telephone Encounter (Signed)
The form is ready  

## 2018-02-24 NOTE — Telephone Encounter (Signed)
Placed in Dr. Barbie Banner RED folder

## 2018-02-24 NOTE — Telephone Encounter (Signed)
I have found the form

## 2018-02-25 NOTE — Telephone Encounter (Signed)
Forms have been faxed placed to be scanned. Called pt and pt's son to advised form have been done and faxed as requested. Left a VM for Timothy to call back if he needed a copy to pick up or be mailed.

## 2018-02-26 NOTE — Telephone Encounter (Signed)
Copied from [place] [id number]. Topic: General - Other >> Feb 25, 2018  5:16 PM Mcneil, Ja-Kwan wrote: Reason for CRM: Pt returned call to office. Pt requests forms to be mailed to home address: [address] [place], [place] [address]

## 2018-02-26 NOTE — Telephone Encounter (Signed)
Waiting for paper work to be scanned into pt's chart to copy and mail it to the pt.

## 2018-03-02 LAB — CUP PACEART REMOTE DEVICE CHECK
Brady Statistic AP VP Percent: 6.82 %
Brady Statistic AP VS Percent: 0.09 %
Brady Statistic AS VP Percent: 90.18 %
Brady Statistic AS VS Percent: 2.9 %
Brady Statistic RV Percent Paced: 92.77 %
Date Time Interrogation Session: 20190827073525
HIGH POWER IMPEDANCE MEASURED VALUE: 59 Ohm
HighPow Impedance: 66 Ohm
Implantable Lead Implant Date: 20091202
Implantable Lead Location: 753858
Implantable Lead Location: 753860
Implantable Lead Model: 4194
Lead Channel Impedance Value: 304 Ohm
Lead Channel Impedance Value: 418 Ohm
Lead Channel Impedance Value: 665 Ohm
Lead Channel Impedance Value: 703 Ohm
Lead Channel Impedance Value: 722 Ohm
Lead Channel Pacing Threshold Amplitude: 2.5 V
Lead Channel Sensing Intrinsic Amplitude: 3.75 mV
Lead Channel Setting Pacing Amplitude: 2 V
Lead Channel Setting Pacing Amplitude: 2.5 V
Lead Channel Setting Pacing Pulse Width: 0.4 ms
MDC IDC LEAD IMPLANT DT: 20091202
MDC IDC LEAD IMPLANT DT: 20091202
MDC IDC LEAD LOCATION: 753859
MDC IDC LEAD SERIAL: 308680
MDC IDC LEAD SERIAL: 635533
MDC IDC MSMT BATTERY REMAINING LONGEVITY: 68 mo
MDC IDC MSMT BATTERY VOLTAGE: 2.99 V
MDC IDC MSMT LEADCHNL LV PACING THRESHOLD PULSEWIDTH: 1 ms
MDC IDC MSMT LEADCHNL RA IMPEDANCE VALUE: 456 Ohm
MDC IDC MSMT LEADCHNL RA PACING THRESHOLD AMPLITUDE: 0.5 V
MDC IDC MSMT LEADCHNL RA PACING THRESHOLD PULSEWIDTH: 0.4 ms
MDC IDC MSMT LEADCHNL RA SENSING INTR AMPL: 3.75 mV
MDC IDC MSMT LEADCHNL RV PACING THRESHOLD AMPLITUDE: 0.75 V
MDC IDC MSMT LEADCHNL RV PACING THRESHOLD PULSEWIDTH: 0.4 ms
MDC IDC MSMT LEADCHNL RV SENSING INTR AMPL: 26.625 mV
MDC IDC MSMT LEADCHNL RV SENSING INTR AMPL: 26.625 mV
MDC IDC PG IMPLANT DT: 20180919
MDC IDC SET LEADCHNL LV PACING PULSEWIDTH: 1 ms
MDC IDC SET LEADCHNL RA PACING AMPLITUDE: 1.5 V
MDC IDC SET LEADCHNL RV SENSING SENSITIVITY: 0.3 mV
MDC IDC STAT BRADY RA PERCENT PACED: 6.76 %

## 2018-03-04 ENCOUNTER — Telehealth: Payer: Self-pay

## 2018-03-04 ENCOUNTER — Other Ambulatory Visit: Payer: Self-pay | Admitting: Family Medicine

## 2018-03-04 NOTE — Telephone Encounter (Signed)
Thank you for follow up MCr

## 2018-03-04 NOTE — Telephone Encounter (Signed)
-----   Message from [name], MD sent at 03/04/2018 11:29 AM EDT ----- Remote reviewed.   Not pacemaker dependent.  Note recent decline in biventricular pacing to 93% (effective pacing only 78%). Battery status is good. Lead measurements are stable. Heart rate histogram is favorable. No clinically significant episodes of high ventricular rate or atrial mode switch noted.  Please call her and make sure she is not having any trouble getting her medications.  I am particularly interested in whether or not she is taking her bisoprolol on a daily basis.

## 2018-03-04 NOTE — Telephone Encounter (Signed)
Spoke with pt informed her that Dr C. Would like to know if she had been taking bisoprolol pt reported that she and that the only mediation she has been taken off of has been Lipitor d/t cramps.

## 2018-03-04 NOTE — Telephone Encounter (Signed)
LVM for pt to call device clinic back. 

## 2018-03-10 NOTE — Telephone Encounter (Signed)
Form has been scanned into the pts chart and this has been printed out and placed in the mail to the pt.

## 2018-03-12 ENCOUNTER — Other Ambulatory Visit: Payer: Self-pay | Admitting: Family Medicine

## 2018-03-12 DIAGNOSIS — J449 Chronic obstructive pulmonary disease, unspecified: Secondary | ICD-10-CM | POA: Diagnosis not present

## 2018-03-15 ENCOUNTER — Ambulatory Visit (INDEPENDENT_AMBULATORY_CARE_PROVIDER_SITE_OTHER): Payer: Medicare Other

## 2018-03-15 DIAGNOSIS — I5042 Chronic combined systolic (congestive) and diastolic (congestive) heart failure: Secondary | ICD-10-CM

## 2018-03-15 DIAGNOSIS — Z95810 Presence of automatic (implantable) cardiac defibrillator: Secondary | ICD-10-CM

## 2018-03-15 NOTE — Progress Notes (Signed)
EPIC Encounter for ICM Monitoring  Patient Name: Sydney Phillips is a 82 y.o. female Date: 03/15/2018 Primary Care Physican: Fry, Stephen A, MD Primary Cardiologist:Croitoru Electrophysiologist: Croitoru DryWeight:Previous weight183lbs(ranges from 180-190 lbs)  (% of Time Since 09-Feb-2018)  Bi-V Pacing: 90.1% Effective 79.5%  (low effective pacing addressed by device nurse and Dr C on 03/04/18)                   Attempted call to patient and unable to reach.  Left detailed message, per DPR, regarding transmission.  Transmission reviewed.    Thoracic impedance normal but above baseline suggesting dryness.  Prescribed: Furosemide 80 mg2 tablets every morning and 1 tablet in the evening.Potassium 20 mEq 2tablets (40 mEq total)twice a day.  Labs: 02/01/2018 Creatinine 1.22, BUN 17, Potassium 3.4, Sodium 140, EGFR 45.07 07/30/2017 Creatinine1.55, BUN40, Potassium5.1, Sodium135 07/23/2017 Creatinine1.17, BUN34, Potassium3.8, Sodium138, EGFR43-50  07/22/2017 Creatinine1.20, BUN35, Potassium4.4, Sodium138, EGFR42-48  07/21/2017 Creatinine1.30, BUN31, Potassium4.5, Sodium136, EGFR38-44  07/20/2017 Creatinine1.25, BUN26, Potassium5.4, Sodium137, EGFR40-46  07/19/2017 Creatinine1.00, BUN22, Potassium4.2, Sodium140  07/18/2017 Creatinine1.14, BUN26, Potassium3.7, Sodium142, EGFR45-52  07/02/2017 Creatinine1.09, BUN17, Potassium3.2, Sodium141 A complete set of results can be found in Results Review  Recommendations: Left voice mail with ICM number and encouraged to call if experiencing any fluid symptoms.  Follow-up plan: ICM clinic phone appointment on 04/19/2018.    Copy of ICM check sent to Dr. Croitoru.   3 month ICM trend: 03/15/2018    1 Year ICM trend:       Laurie S Short, RN 03/15/2018 11:00 AM   

## 2018-03-16 ENCOUNTER — Telehealth: Payer: Self-pay

## 2018-03-16 NOTE — Telephone Encounter (Signed)
Remote ICM transmission received.  Attempted call to patient and left detailed message, per DPR, regarding transmission and next ICM scheduled for 04/19/2018.  Advised to return call for any fluid symptoms or questions.    

## 2018-04-11 DIAGNOSIS — J449 Chronic obstructive pulmonary disease, unspecified: Secondary | ICD-10-CM | POA: Diagnosis not present

## 2018-04-12 ENCOUNTER — Telehealth: Payer: Self-pay | Admitting: Cardiovascular Disease

## 2018-04-12 NOTE — Telephone Encounter (Signed)
New message   1) Are you calling to confirm a diagnosis or obtain personal health information (Y/N)? yes  2) If so, what information is requested? Ejection fracture, bp , and heart rate for heart health program -leave the date that this information comes from as well   Fax# [phone]  Please route to Medical Records or your medical records site representative

## 2018-04-12 NOTE — Telephone Encounter (Signed)
VM & Letterfaxed will need ef% form in order to fax medical records.

## 2018-04-19 ENCOUNTER — Ambulatory Visit (INDEPENDENT_AMBULATORY_CARE_PROVIDER_SITE_OTHER): Payer: Medicare Other

## 2018-04-19 ENCOUNTER — Telehealth: Payer: Self-pay

## 2018-04-19 DIAGNOSIS — Z95810 Presence of automatic (implantable) cardiac defibrillator: Secondary | ICD-10-CM | POA: Diagnosis not present

## 2018-04-19 DIAGNOSIS — I5042 Chronic combined systolic (congestive) and diastolic (congestive) heart failure: Secondary | ICD-10-CM

## 2018-04-19 NOTE — Telephone Encounter (Signed)
Remote ICM transmission received.  Attempted call to patient regarding ICM remote transmission and no answer.  

## 2018-04-19 NOTE — Progress Notes (Signed)
EPIC Encounter for ICM Monitoring  Patient Name: Sydney Phillips is a 82 y.o. female Date: 04/19/2018 Primary Care Physican: Laurey Morale, MD Primary Cardiologist:Croitoru Electrophysiologist: Croitoru DryWeight:Previous weight183lbs(ranges from 180-190 lbs)  BiV Pacing: 95.9% (Effective 78.1%)    OBSERVATIONS (5)  CRT Pacing is effective less than 90% of the time.  LV Capture Management determined that threshold increased by ??? V from 22-Mar-2018 to 24-Mar-2018. This increase was greater   than Amplitude Safety Margin (+0 V) and may have compromised capture.  RV Capture Management: Actual safety margin (3.2 X) > programmed margin (2 X).  Night heart rate over 85 bpm for 7 days.  Patient Activity less than 1 hr/day for 5 weeks.     Attempted call to patient and unable to reach.    Transmission reviewed.   Device nurse Theodoro Doing, RN notified Dr Sallyanne Kuster on 03/04/2018 of 78% effective pacing percentage.  Device nurse left voice mail message for patient to return call due to Dr Sallyanne Kuster wanted an update if patient is taking all her meds daily especially bisoprolol.   Thoracic impedance abnormal suggesting dryness starting 04/12/2018.  Impedance was abnormal suggesting fluid accumulation from 03/21/2018 - 04/08/2018.   Prescribed: Furosemide 80 mg2 tablets every morning and 1 tablet in the evening.Potassium 20 mEq 2tablets (40 mEq total)twice a day.  Labs: 02/01/2018 Creatinine 1.22, BUN 17, Potassium 3.4, Sodium 140, EGFR 45.07 07/30/2017 Creatinine1.55, BUN40, Potassium5.1, Sodium135 07/23/2017 Creatinine1.17, BUN34, Potassium3.8, [id number], [id number]  07/22/2017 Creatinine1.20, BUN35, Potassium4.4, QHUTML465, KPTW65-68  07/21/2017 Creatinine1.30, BUN31, Potassium4.5, Sodium136, [id number]  07/20/2017 Creatinine1.25, BUN26, Potassium5.4, FVCBSW967, [id number]  07/19/2017 Creatinine1.00, BUN22, Potassium4.2, Sodium140  07/18/2017  Creatinine1.14, BUN26, Potassium3.7, [id number], XBLT90-30  07/02/2017 Creatinine1.09, BUN17, Potassium3.2, Sodium141 A complete set of results can be found in Results Review  Recommendations: Unable to reach.  Follow-up plan: ICM clinic phone appointment on 05/06/2018 to recheck fluid levels.   Office appointment scheduled 04/23/2018 with Dr. Sallyanne Kuster.    Copy of ICM check sent to Dr. Sallyanne Kuster.   3 month ICM trend: 04/19/2018    1 Year ICM trend:       [name], RN 04/19/2018 9:07 AM

## 2018-04-19 NOTE — Progress Notes (Signed)
Broad swings, as always. Agree with advice. MCr

## 2018-04-20 ENCOUNTER — Ambulatory Visit: Payer: Medicare Other | Admitting: Family Medicine

## 2018-04-21 ENCOUNTER — Encounter: Payer: Self-pay | Admitting: Family Medicine

## 2018-04-21 ENCOUNTER — Ambulatory Visit (INDEPENDENT_AMBULATORY_CARE_PROVIDER_SITE_OTHER): Payer: Medicare Other | Admitting: Family Medicine

## 2018-04-21 VITALS — BP 130/78 | HR 93 | Temp 98.0°F | Wt 189.4 lb

## 2018-04-21 DIAGNOSIS — E118 Type 2 diabetes mellitus with unspecified complications: Secondary | ICD-10-CM | POA: Diagnosis not present

## 2018-04-21 DIAGNOSIS — N183 Chronic kidney disease, stage 3 unspecified: Secondary | ICD-10-CM

## 2018-04-21 DIAGNOSIS — Z23 Encounter for immunization: Secondary | ICD-10-CM

## 2018-04-21 DIAGNOSIS — I2510 Atherosclerotic heart disease of native coronary artery without angina pectoris: Secondary | ICD-10-CM

## 2018-04-21 DIAGNOSIS — Z9861 Coronary angioplasty status: Secondary | ICD-10-CM

## 2018-04-21 DIAGNOSIS — J439 Emphysema, unspecified: Secondary | ICD-10-CM | POA: Diagnosis not present

## 2018-04-21 DIAGNOSIS — I5042 Chronic combined systolic (congestive) and diastolic (congestive) heart failure: Secondary | ICD-10-CM

## 2018-04-21 DIAGNOSIS — I10 Essential (primary) hypertension: Secondary | ICD-10-CM

## 2018-04-21 MED ORDER — ACETAMINOPHEN 500 MG PO TABS: 500.0000 mg | ORAL_TABLET | Freq: Two times a day (BID) | ORAL | 0 refills | Status: DC

## 2018-04-21 MED ORDER — BISOPROLOL FUMARATE 5 MG PO TABS: 5.0000 mg | ORAL_TABLET | Freq: Every day | ORAL | 3 refills | Status: DC

## 2018-04-21 MED ORDER — CLOPIDOGREL BISULFATE 75 MG PO TABS: 75.0000 mg | ORAL_TABLET | Freq: Every day | ORAL | 3 refills | Status: DC

## 2018-04-21 NOTE — Progress Notes (Signed)
   Subjective:    Patient ID: Sydney Phillips, female    DOB: Dec 18, 1935, 82 y.o.   MRN: [id number]  HPI Here for a few issues. She is doing well except for her arthritis. Her joints are stiff and it hard for her to get around. She uses her walker at all times. She is scheduled for an A1c on 05-10-18. She had to stop Metformin due to GI side effects. Her glucoses at home range from 120 to 180 fasting. She thinks her Xanax gives her nightmares. She is currently taking 1 and 1/2 tablets at bedtime and none during the day.    Review of Systems  Constitutional: Negative.   Respiratory: Negative.   Cardiovascular: Negative.   Musculoskeletal: Positive for arthralgias and gait problem.       Objective:   Physical Exam  Constitutional: She is oriented to person, place, and time. She appears well-developed and well-nourished.  Cardiovascular: Normal rate, regular rhythm, normal heart sounds and intact distal pulses.  Pulmonary/Chest: Effort normal and breath sounds normal. No stridor. No respiratory distress. She has no wheezes. She has no rales.  Neurological: She is alert and oriented to person, place, and time.          Assessment & Plan:  Her HTN and CHF are stable. We will get an A1c soon as mentioned above. Given a flu shot. She is using Tylenol for joint pains. She will decrease the Xanax to one tablet at bedtime only. Alysia Penna, MD

## 2018-04-23 ENCOUNTER — Encounter: Payer: Self-pay | Admitting: Cardiovascular Disease

## 2018-04-23 ENCOUNTER — Ambulatory Visit (INDEPENDENT_AMBULATORY_CARE_PROVIDER_SITE_OTHER): Payer: Medicare Other | Admitting: Cardiovascular Disease

## 2018-04-23 VITALS — BP 119/70 | HR 91 | Ht 65.5 in | Wt 190.0 lb

## 2018-04-23 DIAGNOSIS — I2510 Atherosclerotic heart disease of native coronary artery without angina pectoris: Secondary | ICD-10-CM | POA: Diagnosis not present

## 2018-04-23 DIAGNOSIS — I5042 Chronic combined systolic (congestive) and diastolic (congestive) heart failure: Secondary | ICD-10-CM

## 2018-04-23 DIAGNOSIS — E7800 Pure hypercholesterolemia, unspecified: Secondary | ICD-10-CM

## 2018-04-23 DIAGNOSIS — R9431 Abnormal electrocardiogram [ECG] [EKG]: Secondary | ICD-10-CM

## 2018-04-23 DIAGNOSIS — Z95810 Presence of automatic (implantable) cardiac defibrillator: Secondary | ICD-10-CM | POA: Diagnosis not present

## 2018-04-23 DIAGNOSIS — I739 Peripheral vascular disease, unspecified: Secondary | ICD-10-CM | POA: Diagnosis not present

## 2018-04-23 DIAGNOSIS — I255 Ischemic cardiomyopathy: Secondary | ICD-10-CM | POA: Diagnosis not present

## 2018-04-23 DIAGNOSIS — J449 Chronic obstructive pulmonary disease, unspecified: Secondary | ICD-10-CM

## 2018-04-23 LAB — BASIC METABOLIC PANEL
BUN/Creatinine Ratio: 19 (ref 12–28)
BUN: 25 mg/dL (ref 8–27)
CALCIUM: 9.9 mg/dL (ref 8.7–10.3)
CO2: 26 mmol/L (ref 20–29)
Chloride: 96 mmol/L (ref 96–106)
Creatinine, Ser: 1.33 mg/dL — ABNORMAL HIGH (ref 0.57–1.00)
GFR, EST AFRICAN AMERICAN: 44 mL/min/{1.73_m2} — AB (ref >59)
GFR, EST NON AFRICAN AMERICAN: 38 mL/min/{1.73_m2} — AB (ref >59)
Glucose: 219 mg/dL — ABNORMAL HIGH (ref 65–99)
Potassium: 4.3 mmol/L (ref 3.5–5.2)
SODIUM: 141 mmol/L (ref 134–144)

## 2018-04-23 LAB — PRO B NATRIURETIC PEPTIDE: NT-PRO BNP: 1123 pg/mL — AB (ref 0–738)

## 2018-04-23 LAB — MAGNESIUM: MAGNESIUM: 2.1 mg/dL (ref 1.6–2.3)

## 2018-04-23 NOTE — Progress Notes (Signed)
Patient ID: Sydney Phillips, female   DOB: May 15, 1936, 82 y.o.   MRN: [id number]    Cardiology Office Note    Date:  04/23/2018   ID:  Sydney Phillips, DOB [birth year], MRN [id number]  PCP:  Sydney Morale, MD  Cardiologist:   Sydney Klein, MD   Chief Complaint  Patient presents with  . Follow-up    History of Present Illness:  Sydney Phillips is a 82 y.o. female with cardiomyopathy here for CHF follow up, check on CRT-D device, Repeat CHF hospitalizations in November 2017, December 2017,January 2018, July 2018, February 2019.   She has not required hospitalization, emergency room visits or major changes in her diuretics since last February.  Her defibrillator has shown very broad swings in her fluid status given by her OptiVol.  She seems to go from "very wet" to "a very dry" frequently, although it is hard to see a direct correlation between that and her weights.  She denies excessive sodium intake and reports careful compliance with her diuretic prescription (furosemide 160 mg every morning and 80 mg every afternoon).  One of these broad swings in thoracic impedance have been just with the last couple weeks she appeared to be volume overloaded (interestingly, there was a simultaneous decrease in the "effective" biV pacing rate) and all of a sudden the impedance swung the other direction before stabilizing in the last couple of days.  She has excellent biventricular pacing approaching 70.  We have been no changes in device programming of medications during this period of time.  She does not feel well, but blames it on her age and many of her complaints are noncardiac.  She has a lot of fatigue but also a lot of problems with a stiff back and stiff joints.  She cannot stand up or sit down without moving for more than 30 seconds because she stiffens up.  She does not describe orthopnea or PND.    Her current weight is 190 pounds seems to be close hemoglobin status.  She seems to stay  within 2 pounds of this plus or minus.  She reports that her home scale today showed the same weight in our office scale shows: 190 lb.  She still smokes and has no intention to quit.  Interrogation of her Medtronic Claria CRT-D device implanted as a generator change out in September 2018 shows generally favorable findings.  Estimated generator longevity 5.7 years.  She has 95% ventricular pacing (82% effective, mostly due to reduced effectiveness in October, now back to baseline).  She only has 5% atrial pacing.  There have been no episodes of atrial fibrillation or ventricular tachycardia.  She has not received defibrillator discharges or other therapies.  LV pacing is in the LV tip-RV coil configuration with a relatively high threshold of 2.125 V at 1.0 ms she has 97.6% pacing.  She has mixed ischemic and nonischemic CMP and has been CRT "hyper-responder". In 2009 her ejection fraction was 20%. After CRT, her LV improved and in 2013 EF was 50% and she had several years of relative cardiac stability. LVEF in September 2017 was 40-45%, then dropped to 25-30% in November 2017 when she had NSTEMI, in the setting of COPD exacerbation, respiratory failure, acute renal failure and syncope. Cardiac catheterization showed severe stenoses in the right coronary artery and left circumflex coronary artery both treated with drug-eluting stents. There was also moderate ostial stenosis of the LAD. Enrolled in Twilight study. Repeat echo in January 2018  showed EF 20-25% with a severely dilated left ventricle, moderate mitral regurgitation, systolic PA pressure 53 mmHg. Was readmitted on May 30 2016 with altered mental status, suspicion of steroid induced psychosis and discharged to skilled nursing facility, but again seen in the emergency room on December 19 after a fall with head injury. Gradually improved mental status and felt to be back at baseline by early January 2018. After that hospitalization for diuretics  were held for acute renal insufficiency and she was admitted on January 11 with acute exacerbation of heart failure. Improved with diuretics and discharged on January 15 with a weight of 77.4 kg (171 lb).  February 2019, LVEF had partially rebounded to 35-40%.  Continues to require hospitalization for heart failure exacerbation on the average 2 or 3 times a year since then.  She has twin boys; Sydney Phillips has medical power of attorney.  Sydney Phillips accompanies her to the office today.  Past Medical History:  Diagnosis Date  . Anxiety   . Arthritis   . Asthma   . Automatic implantable cardioverter-defibrillator in situ   . Bronchitis   . CAD (coronary artery disease)    a. s/p DES to LCx/RCA 05/2016, ostial LAD disease.  . Cardiomyopathy ([place])   . Chronic back pain   . Chronic combined systolic and diastolic CHF (congestive heart failure) ([place])   . Chronic constipation   . Chronic pain   . CKD (chronic kidney disease), stage III ([place])   . Colon polyps 2003.  2015.   HP polyps 2003.  adnomas 2015.  required referal to [facility] for colonoscopic resection of flat polyps.   Marland Kitchen COPD (chronic obstructive pulmonary disease) ([place])   . Dementia ([place])   . Depression   . Depression with anxiety    takes Cymbalta daily  . Diabetes mellitus ([place])   . Dyspnea   . Early cataracts, bilateral   . Fibromyalgia   . GERD (gastroesophageal reflux disease)    was on meds but was taken off;now watches what she eats  . Hemorrhoids   . History of kidney stones   . Hx of colonic polyps   . Hyperlipemia    takes Crestor daily  . Hypertension    takes Amlodipine and Metoprolol daily  . Insomnia   . LBBB (left bundle branch block)    Stress test 09/03/2010, EF 55  . Myocardial infarction ([place])   . PAD (peripheral artery disease) ([facility])    Carotid, subclavian, and lower extremity beds, currently not symptomatic  . Presence of combination internal cardiac defibrillator (ICD) and pacemaker   . Presence of permanent  cardiac pacemaker   . Pulmonary hypertension ([place])   . S/P angioplasty with stent, lt. subclavian 07/31/11 08/01/2011  . Subclavian arterial stenosis, lt, with PTA/STENT 07/31/11 08/01/2011  . Syncope 07/28/2011   EF - 50-55, moderate concentric hypertrophy in left ventricle  . Urinary incontinence   . Vertigo    takes Meclizine prn    Past Surgical History:  Procedure Laterality Date  . ABDOMINAL AORTAGRAM N/A 08/12/2013   Procedure: ABDOMINAL Maxcine Ham;  Surgeon: Elam Dutch, MD;  Location: [facility] CATH LAB;  Service: Cardiovascular;  Laterality: N/A;  . ABDOMINAL HYSTERECTOMY    . APPENDECTOMY    . BACK SURGERY  2012  . BIV ICD GENERTAOR CHANGE OUT Left 02/20/2012   Procedure: BIV ICD GENERTAOR CHANGE OUT;  Surgeon: Sydney Klein, MD;  Location: [facility] CATH LAB;  Service: Cardiovascular;  Laterality: Left;  . CARDIAC CATHETERIZATION  12/01/2007  By Dr. Melvern Banker, left heart cath,   . CARDIAC CATHETERIZATION N/A 05/20/2016   Procedure: Right/Left Heart Cath and Coronary Angiography;  Surgeon: Sherren Mocha, MD;  Location: [place] CV LAB;  Service: Cardiovascular;  Laterality: N/A;  . CARDIAC CATHETERIZATION N/A 05/20/2016   Procedure: Coronary Stent Intervention;  Surgeon: Sherren Mocha, MD;  Location: [place] CV LAB;  Service: Cardiovascular;  Laterality: N/A;  . CARDIAC DEFIBRILLATOR PLACEMENT  05/2008   By Dr Blanch Media, Medtronic CANNOT HAVE MRI's  . CAROTID ANGIOGRAM N/A 07/31/2011   Procedure: CAROTID ANGIOGRAM;  Surgeon: Lorretta Harp, MD;  Location: [facility] CATH LAB;  Service: Cardiovascular;  Laterality: N/A;  carotid angiogram and possible Lt SCA PTA  . COLONOSCOPY W/ POLYPECTOMY  12/2013  . CORONARY ANGIOPLASTY    . ENDARTERECTOMY Left 11/08/2014   Procedure: LEFT CAROTID ENDARTERECTOMY WITH HEMASHIELD PATCH ANGIOPLASTY;  Surgeon: Elam Dutch, MD;  Location: [place];  Service: Vascular;  Laterality: Left;  . ESOPHAGOGASTRODUODENOSCOPY N/A 01/20/2014   Procedure:  ESOPHAGOGASTRODUODENOSCOPY (EGD);  Surgeon: Jerene Bears, MD;  Location: [facility] ENDOSCOPY;  Service: Endoscopy;  Laterality: N/A;  . FEMORAL-POPLITEAL BYPASS GRAFT Right 10/12/2013   Procedure:   Femoral-Peroneal trunk  bypass with nonreversed greater saphenous vein graft;  Surgeon: Elam Dutch, MD;  Location: [place];  Service: Vascular;  Laterality: Right;  . GIVENS CAPSULE STUDY N/A 01/20/2014   Procedure: GIVENS CAPSULE STUDY;  Surgeon: Jerene Bears, MD;  Location: [place];  Service: Gastroenterology;  Laterality: N/A;  . ICD GENERATOR CHANGEOUT N/A 03/04/2017   Procedure: ICD Generator Changeout;  Surgeon: Sydney Klein, MD;  Location: [place] CV LAB;  Service: Cardiovascular;  Laterality: N/A;  . INSERT / REPLACE / REMOVE PACEMAKER    . INTRAOPERATIVE ARTERIOGRAM Right 10/12/2013   Procedure: INTRA OPERATIVE ARTERIOGRAM;  Surgeon: Elam Dutch, MD;  Location: [place];  Service: Vascular;  Laterality: Right;  . ORIF ELBOW FRACTURE  08/16/2011   Procedure: OPEN REDUCTION INTERNAL FIXATION (ORIF) ELBOW/OLECRANON FRACTURE;  Surgeon: Schuyler Amor, MD;  Location: [place];  Service: Orthopedics;  Laterality: Left;  . RENAL ANGIOGRAM N/A 08/12/2013   Procedure: RENAL ANGIOGRAM;  Surgeon: Elam Dutch, MD;  Location: [facility] CATH LAB;  Service: Cardiovascular;  Laterality: N/A;  . SUBCLAVIAN STENT PLACEMENT Left 07/31/2011   7x18 Genesis, balloon, with reduction of 90% ostial left subclavian artery stenosis to 0% with residual excellent flow  . TONSILLECTOMY    . TUBAL LIGATION      Current Medications: Outpatient Medications Prior to Visit  Medication Sig Dispense Refill  . ACCU-CHEK FASTCLIX LANCETS MISC USE   TO CHECK GLUCOSE ONCE DAILY 102 each 1  . ACCU-CHEK SMARTVIEW test strip CHECK BLOOD SUGAR ONCE DAILY 100 each 1  . acetaminophen (TYLENOL) 500 MG tablet Take 1 tablet (500 mg total) by mouth 2 (two) times daily. 30 tablet 0  . albuterol (PROVENTIL) (2.5 MG/3ML) 0.083% nebulizer  solution USE 1 VIAL IN NEBULIZER EVERY 4 HOURS AS NEEDED FOR WHEEZING OR  SHORTNESS  OF  BREATH 3 mL 0  . allopurinol (ZYLOPRIM) 300 MG tablet Take 1 tablet (300 mg total) by mouth daily. 30 tablet 0  . ALPRAZolam (XANAX) 1 MG tablet Take 1 tablet (1 mg total) by mouth 2 (two) times daily as needed for anxiety. Take 1 tablet in the morning, and 1.5 tablets at night 75 tablet 5  . aspirin 81 MG chewable tablet Chew 1 tablet (81 mg total) by mouth daily. 30 tablet 1  .  azelastine (ASTELIN) 0.1 % nasal spray Place 2 sprays into both nostrils 2 (two) times daily. Use in each nostril as directed 30 mL 12  . bisoprolol (ZEBETA) 5 MG tablet Take 1 tablet (5 mg total) by mouth daily. 90 tablet 3  . cholecalciferol (VITAMIN D) 1000 units tablet Take 1,000 Units by mouth daily.    . clopidogrel (PLAVIX) 75 MG tablet Take 1 tablet (75 mg total) by mouth daily. 90 tablet 3  . cyanocobalamin 1000 MCG tablet Take 1,000 mcg by mouth daily.    . DULoxetine (CYMBALTA) 60 MG capsule TAKE 1 CAPSULE BY MOUTH TWICE DAILY 60 capsule 11  . furosemide (LASIX) 80 MG tablet Take 2 tablets in the am and 1 tablet in the pm 90 tablet 11  . Magnesium Oxide 400 MG CAPS Take 1 capsule (400 mg total) by mouth 2 (two) times daily. 180 capsule 3  . nitroGLYCERIN (NITROSTAT) 0.4 MG SL tablet Place 0.4 mg under the tongue every 5 (five) minutes as needed for chest pain. X 3 doses    . potassium chloride SA (K-DUR,KLOR-CON) 20 MEQ tablet Take 2 tablets (40 mEq total) by mouth 2 (two) times daily. 360 tablet 3  . PROAIR HFA 108 (90 Base) MCG/ACT inhaler Inhale 2 puffs into the lungs every 6 (six) hours as needed for wheezing or shortness of breath.     . traZODone (DESYREL) 50 MG tablet Take 1 tablet (50 mg total) by mouth at bedtime. (Patient taking differently: Take 50 mg by mouth at bedtime as needed for sleep. ) 90 tablet 3   No facility-administered medications prior to visit.      Allergies:   Potassium-containing compounds;  Neomycin-polymyxin b gu; Azithromycin; Codeine; Darvon; Erythromycin; Meloxicam; Norco [hydrocodone-acetaminophen]; Penicillins; Propoxyphene n-acetaminophen; Rofecoxib; Rosuvastatin; Statins; and Sulfa antibiotics   Social History   Socioeconomic History  . Marital status: Widowed    Spouse name: Not on file  . Number of children: 3  . Years of education: 52  . Highest education level: Not on file  Occupational History  . Occupation: Retired  Scientific laboratory technician  . Financial resource strain: Not on file  . Food insecurity:    Worry: Not on file    Inability: Not on file  . Transportation needs:    Medical: Not on file    Non-medical: Not on file  Tobacco Use  . Smoking status: Former Smoker    Packs/day: 0.50    Years: 62.00    Pack years: 31.00    Types: Cigarettes    Last attempt to quit: 07/19/2017    Years since quitting: 0.7  . Smokeless tobacco: Never Used  Substance and Sexual Activity  . Alcohol use: No    Alcohol/week: 0.0 standard drinks  . Drug use: No  . Sexual activity: Never    Birth control/protection: Surgical  Lifestyle  . Physical activity:    Days per week: Not on file    Minutes per session: Not on file  . Stress: Not on file  Relationships  . Social connections:    Talks on phone: Not on file    Gets together: Not on file    Attends religious service: Not on file    Active member of club or organization: Not on file    Attends meetings of clubs or organizations: Not on file    Relationship status: Not on file  Other Topics Concern  . Not on file  Social History Narrative   Ok to share  information with medical POA, Son Gabriel Carina   Right-handed   Caffeine: Pepsi     Family History:  The patient's family history includes CAD in her father; Cancer in her sister; Colon cancer in her maternal grandmother; Deep vein thrombosis in her son; Diabetes in her sister; Heart disease in her father, mother, and sister; Hyperlipidemia in her father and unknown  relative.   ROS:   Please see the history of present illness.    ROS all other systems are reviewed and are negative  PHYSICAL EXAM:   VS:  BP 119/70   Pulse 91   Ht 5' 5.5" (1.664 m)   Wt 190 lb (86.2 kg)   BMI 31.14 kg/m      General: Alert, oriented x3, no distress, borderline obese, healthy left subclavian defibrillator site Head: no evidence of trauma, PERRL, EOMI, no exophtalmos or lid lag, no myxedema, no xanthelasma; normal ears, nose and oropharynx Neck: normal jugular venous pulsations and no hepatojugular reflux; brisk carotid pulses without delay and no carotid bruits Chest: clear to auscultation, no signs of consolidation by percussion or palpation, normal fremitus, symmetrical and full respiratory excursions Cardiovascular: normal position and quality of the apical impulse, regular rhythm, normal first and second heart sounds, no murmurs, rubs or gallops Abdomen: no tenderness or distention, no masses by palpation, no abnormal pulsatility or arterial bruits, normal bowel sounds, no hepatosplenomegaly Extremities: no clubbing, cyanosis or edema; 2+ radial, ulnar and brachial pulses bilaterally; 2+ right femoral, posterior tibial and dorsalis pedis pulses; 2+ left femoral, posterior tibial and dorsalis pedis pulses; no subclavian or femoral bruits Neurological: grossly nonfocal Psych: Normal mood and affect   Studies/Labs Reviewed:   EKG:  EKG is ordered today and shows atrial sensed (sinus), biventricular paced rhythm with a small R wave in lead V1.  The QRS is 146 ms.  QTc 558 ms.  Recent Labs: 07/19/2017: B Natriuretic Peptide 550.6 07/23/2017: Magnesium 2.3 02/01/2018: ALT 19; BUN 17; Creatinine, Ser 1.22; Hemoglobin 14.7; Platelets 267.0; Potassium 3.4; Sodium 140; TSH 2.79   Lipid Panel    Component Value Date/Time   CHOL 183 07/02/2017 1120   TRIG 296.0 (H) 07/02/2017 1120   HDL 31.50 (L) 07/02/2017 1120   CHOLHDL 6 07/02/2017 1120   VLDL 59.2 (H) 07/02/2017  1120   LDLCALC 140 (H) 12/19/2016 0736   LDLDIRECT 115.0 07/02/2017 1120     ASSESSMENT:    1. Chronic combined systolic and diastolic heart failure ([place])   2. Long QT interval   3. Biventricular automatic implantable cardioverter defibrillator in situ   4. Coronary artery disease involving native coronary artery of native heart without angina pectoris   5. PAD (peripheral artery disease) ([place])   6. Hypercholesterolemia   7. Chronic obstructive pulmonary disease, unspecified COPD type ([place])      PLAN:  In order of problems listed above:  1. CHF: Continues to show evidence of rapid swings from hypovolemia to hypovolemia, thankfully without requiring hospitalization or other serious health outcomes.. Most recent EF around 35-40 % on echo from February 2019.  She is taking a highly selective beta blocker.  She is not on RAA S inhibitors due to volatile renal function and the tendency to orthostatic hypotension and falls.  Today she appears to be close to euvolemic.  I have asked her to call us if she gains or loses more than 2 pounds from today's weight. 2. Long QT: QT interval appears excessively prolonged even accounting for her brought paced  QRS.  We will check labs today. 3. CRT-D: Normal device function.  I cannot really explain why she had reduction in effective biV pacing throughout the month of November and the issue seems to have resolved.  She is undergoing monthly OptiVol downloads, although she rarely answers the phone when we try to contact her about the results.  Continue remote downloads every 3 months office visit in 6 months. 4. CAD s/p DES to RCA and LCX Dec 2017: High risk anatomy, so she is on lifelong aspirin and clopidogrel.  She has not had any bleeding problems.  Remote history of GI bleeding, none recently.  5. PAD: History of left subclavian stent, should monitor blood pressure always in the right arm. History of left carotid endarterectomy, followed by Dr. Oneida Alar.  Asymptomatic decrease in left lower extremity ABI. 6. HLP: No longer on statin (muscle cramps). Not interested in other agents. 7. COPD: no plans to quit smoking.   Medication Adjustments/Labs and Tests Ordered: Current medicines are reviewed at length with the patient today.  Concerns regarding medicines are outlined above.  Medication changes, Labs and Tests ordered today are listed in the Patient Instructions below. Patient Instructions  Medication Instructions:  Dr Sallyanne Kuster recommends that you continue on your current medications as directed. Please refer to the Current Medication list given to you today.  If you need a refill on your cardiac medications before your next appointment, please call your pharmacy.   Lab work: Your physician recommends that you return for lab work TODAY.  If you have labs (blood work) drawn today and your tests are completely normal, you will receive your results only by: Marland Kitchen MyChart Message (if you have MyChart) OR . A paper copy in the mail If you have any lab test that is abnormal or we need to change your treatment, we will call you to review the results.  Follow-Up: At [facility], you and your health needs are our priority.  As part of our continuing mission to provide you with exceptional heart care, we have created designated Provider Care Teams.  These Care Teams include your primary Cardiologist (physician) and Advanced Practice Providers (APPs -  Physician Assistants and Nurse Practitioners) who all work together to provide you with the care you need, when you need it.  You will need a follow up appointment in 6 months.  Please call our office 2 months in advance to schedule this appointment.  You may see Sydney Klein, MD or one of the following Advanced Practice Providers on your designated Care Team: [place], [place] . Fabian Sharp, PA-C  Any Other Special Instructions Will Be Listed Below (If Applicable).    Call if your weight is less  than 188 pounds or more than 192 pounds for instructions regarding diuretics.     Signed, Sydney Klein, MD  04/23/2018 2:41 PM    [address], [place], [place]  [address] Phone: [phone]; Fax: [phone]

## 2018-04-23 NOTE — Patient Instructions (Signed)
Medication Instructions:  Dr Sallyanne Kuster recommends that you continue on your current medications as directed. Please refer to the Current Medication list given to you today.  If you need a refill on your cardiac medications before your next appointment, please call your pharmacy.   Lab work: Your physician recommends that you return for lab work TODAY.  If you have labs (blood work) drawn today and your tests are completely normal, you will receive your results only by: Marland Kitchen MyChart Message (if you have MyChart) OR . A paper copy in the mail If you have any lab test that is abnormal or we need to change your treatment, we will call you to review the results.  Follow-Up: At [facility], you and your health needs are our priority.  As part of our continuing mission to provide you with exceptional heart care, we have created designated Provider Care Teams.  These Care Teams include your primary Cardiologist (physician) and Advanced Practice Providers (APPs -  Physician Assistants and Nurse Practitioners) who all work together to provide you with the care you need, when you need it.  You will need a follow up appointment in 6 months.  Please call our office 2 months in advance to schedule this appointment.  You may see [name], MD or one of the following Advanced Practice Providers on your designated Care Team: [place], [place] . Fabian Sharp, PA-C  Any Other Special Instructions Will Be Listed Below (If Applicable).    Call if your weight is less than 188 pounds or more than 192 pounds for instructions regarding diuretics.

## 2018-04-25 ENCOUNTER — Telehealth: Payer: Self-pay | Admitting: Cardiology

## 2018-04-25 NOTE — Telephone Encounter (Signed)
Patient called stating that she has CHF and was told to call if her weight got over 192lbs and tonight it is 191lbs.  She had barbecued pork ribs today and thinks she over did it.  She feels fine with no complaints.  Instructed her to avoid salty foods and to reweight herself in the am and if weight has increased further to call the office.

## 2018-04-30 LAB — CUP PACEART INCLINIC DEVICE CHECK
Battery Remaining Longevity: 68 mo
Brady Statistic AP VP Percent: 5.16 %
Brady Statistic AP VS Percent: 0.11 %
Brady Statistic AS VP Percent: 92.78 %
Brady Statistic RA Percent Paced: 5.18 %
Brady Statistic RV Percent Paced: 94.78 %
HighPow Impedance: 61 Ohm
HighPow Impedance: 68 Ohm
Implantable Lead Implant Date: 20091202
Implantable Lead Location: 753859
Implantable Lead Location: 753860
Implantable Lead Model: 4194
Implantable Lead Serial Number: 308680
Lead Channel Impedance Value: 304 Ohm
Lead Channel Impedance Value: 475 Ohm
Lead Channel Impedance Value: 665 Ohm
Lead Channel Impedance Value: 722 Ohm
Lead Channel Pacing Threshold Amplitude: 0.375 V
Lead Channel Pacing Threshold Pulse Width: 0.4 ms
Lead Channel Sensing Intrinsic Amplitude: 3.75 mV
Lead Channel Sensing Intrinsic Amplitude: 3.75 mV
Lead Channel Sensing Intrinsic Amplitude: 31 mV
Lead Channel Setting Pacing Amplitude: 1.5 V
Lead Channel Setting Pacing Amplitude: 2 V
Lead Channel Setting Pacing Amplitude: 2.5 V
Lead Channel Setting Pacing Pulse Width: 0.4 ms
Lead Channel Setting Sensing Sensitivity: 0.3 mV
MDC IDC LEAD IMPLANT DT: 20091202
MDC IDC LEAD IMPLANT DT: 20091202
MDC IDC LEAD LOCATION: 753858
MDC IDC LEAD SERIAL: 635533
MDC IDC MSMT BATTERY VOLTAGE: 2.98 V
MDC IDC MSMT LEADCHNL LV PACING THRESHOLD AMPLITUDE: 1.875 V
MDC IDC MSMT LEADCHNL LV PACING THRESHOLD PULSEWIDTH: 1 ms
MDC IDC MSMT LEADCHNL RA IMPEDANCE VALUE: 456 Ohm
MDC IDC MSMT LEADCHNL RV IMPEDANCE VALUE: 722 Ohm
MDC IDC MSMT LEADCHNL RV PACING THRESHOLD AMPLITUDE: 0.75 V
MDC IDC MSMT LEADCHNL RV PACING THRESHOLD PULSEWIDTH: 0.4 ms
MDC IDC MSMT LEADCHNL RV SENSING INTR AMPL: 31 mV
MDC IDC PG IMPLANT DT: 20180919
MDC IDC SESS DTM: 20191108162530
MDC IDC SET LEADCHNL LV PACING PULSEWIDTH: 1 ms
MDC IDC STAT BRADY AS VS PERCENT: 1.96 %

## 2018-05-04 ENCOUNTER — Other Ambulatory Visit: Payer: Self-pay | Admitting: Family Medicine

## 2018-05-07 NOTE — Telephone Encounter (Signed)
Requested medication (s) are due for refill today: yes  Requested medication (s) are on the active medication list: yes  Last refill:  02/27/17   Future visit scheduled: no  Notes to clinic:  Prescription expired on 02/18/17   Requested Prescriptions  Pending Prescriptions Disp Refills   PROAIR HFA 108 (90 Base) MCG/ACT inhaler [Pharmacy Med Name: PROAIR HFA          AER] 9 each 5    Sig: INHALE 2 PUFFS BY MOUTH EVERY 4 HOURS AS NEEDED FOR WHEEZING AND FOR SHORTNESS OF BREATH     Pulmonology:  Beta Agonists Failed - 05/07/2018 10:43 AM      Failed - One inhaler should last at least one month. If the patient is requesting refills earlier, contact the patient to check for uncontrolled symptoms.      Passed - Valid encounter within last 12 months    Recent Outpatient Visits          2 weeks ago Essential hypertension   Therapist, music at [facility], Ishmael Holter, MD   3 months ago Weakness generalized   Therapist, music at [facility], Ishmael Holter, MD   6 months ago Acute bronchitis with COPD [facility])   [place] at [facility], Ishmael Holter, MD   7 months ago Rhinitis, chronic   Therapist, music at [facility], Ishmael Holter, MD   7 months ago Acute otitis externa of left ear, unspecified type   Therapist, music at [place], [place]           Signed Prescriptions Disp Refills   albuterol (PROVENTIL) (2.5 MG/3ML) 0.083% nebulizer solution 75 vial 0    Sig: USE 1 VIAL IN NEBULIZER EVERY 4 HOURS AS NEEDED FOR WHEEZING OR SHORTNESS OF BREATH     Pulmonology:  Beta Agonists Failed - 05/07/2018 10:43 AM      Failed - One inhaler should last at least one month. If the patient is requesting refills earlier, contact the patient to check for uncontrolled symptoms.      Passed - Valid encounter within last 12 months    Recent Outpatient Visits          2 weeks ago Essential hypertension   Therapist, music at [place], MD   3 months ago  Weakness generalized   Therapist, music at [facility], Ishmael Holter, MD   6 months ago Acute bronchitis with COPD [facility])   [place] at [place], MD   7 months ago Rhinitis, chronic   Therapist, music at [place], MD   7 months ago Acute otitis externa of left ear, unspecified type   Therapist, music at [facility], Ishmael Holter, MD

## 2018-05-07 NOTE — Telephone Encounter (Signed)
Pt states she is out of medication and is needing this to help her breath. Requesting if at all possible if it could be sent in today. Requesting the nurse to call her back when sent.

## 2018-05-07 NOTE — Telephone Encounter (Signed)
Called pt and gave message that her prescriptions have been sent to pharmacy

## 2018-05-10 ENCOUNTER — Other Ambulatory Visit (INDEPENDENT_AMBULATORY_CARE_PROVIDER_SITE_OTHER): Payer: Medicare Other

## 2018-05-10 ENCOUNTER — Telehealth: Payer: Self-pay | Admitting: Family Medicine

## 2018-05-10 DIAGNOSIS — E118 Type 2 diabetes mellitus with unspecified complications: Secondary | ICD-10-CM

## 2018-05-10 LAB — HEMOGLOBIN A1C: Hgb A1c MFr Bld: 7.6 % — ABNORMAL HIGH (ref 4.6–6.5)

## 2018-05-10 MED ORDER — PROAIR HFA 108 (90 BASE) MCG/ACT IN AERS: INHALATION_SPRAY | RESPIRATORY_TRACT | 3 refills | Status: DC

## 2018-05-10 NOTE — Telephone Encounter (Signed)
Refill has been sent to the pharmacy.  I have called the pt and lmom to make her aware

## 2018-05-10 NOTE — Telephone Encounter (Signed)
Patient needs her Pro-Air inhaler called in.  She is completely out.  She requested it last Wednesday to be called in.   [place] [facility] [address]

## 2018-05-11 ENCOUNTER — Ambulatory Visit (INDEPENDENT_AMBULATORY_CARE_PROVIDER_SITE_OTHER): Payer: Medicare Other

## 2018-05-11 ENCOUNTER — Telehealth: Payer: Self-pay

## 2018-05-11 DIAGNOSIS — I255 Ischemic cardiomyopathy: Secondary | ICD-10-CM | POA: Diagnosis not present

## 2018-05-11 DIAGNOSIS — I5042 Chronic combined systolic (congestive) and diastolic (congestive) heart failure: Secondary | ICD-10-CM

## 2018-05-11 DIAGNOSIS — Z95810 Presence of automatic (implantable) cardiac defibrillator: Secondary | ICD-10-CM

## 2018-05-11 NOTE — Progress Notes (Signed)
EPIC Encounter for ICM Monitoring  Patient Name: Sydney Phillips is a 82 y.o. female Date: 05/11/2018 Primary Care Physican: Laurey Morale, MD Primary Cardiologist:Croitoru Electrophysiologist: Croitoru BiV Pacing: 98.3% (Effective 93.5%)     Last Weight: 183lbs(ranges from 180-190 lbs) Today's Weight:  unknown      Attempted call to patient and unable to reach.  Left message to return call.  Transmission reviewed.    Thoracic impedance abnormal suggesting fluid accumulation starting 05/08/2018.   Prescribed: Furosemide 80 mg2 tabletsevery morning and 1 tablet in the evening.Potassium 20 mEq 2tablets (40 mEq total)twice a day.  Labs: 02/01/2018 Creatinine 1.22, BUN 17, Potassium 3.4, Sodium 140, EGFR 45.07 07/30/2017 Creatinine1.55, BUN40, Potassium5.1, Sodium135 07/23/2017 Creatinine1.17, BUN34, Potassium3.8, [id number], [id number]  07/22/2017 Creatinine1.20, BUN35, Potassium4.4, NGFREV200, VLDK44-61  07/21/2017 Creatinine1.30, BUN31, Potassium4.5, Sodium136, JUVQ22-41  07/20/2017 Creatinine1.25, BUN26, Potassium5.4, HOYWVX427, ARWP10-03  07/19/2017 Creatinine1.00, BUN22, Potassium4.2, Sodium140  07/18/2017 Creatinine1.14, BUN26, Potassium3.7, EJYLTE435, TPNS25-83  07/02/2017 Creatinine1.09, BUN17, Potassium3.2, Sodium141 A complete set of results can be found in Results Review  Recommendations: Unable to reach.  Follow-up plan: ICM clinic phone appointment on 05/20/2018 to recheck fluid levels.   Copy of ICM check sent to Dr. Sallyanne Kuster.   3 month ICM trend: 05/11/2018    1 Year ICM trend:       [name], RN 05/11/2018 8:52 AM

## 2018-05-11 NOTE — Telephone Encounter (Signed)
Remote ICM transmission received.  Attempted call to patient regarding ICM remote transmission and left message to return call   

## 2018-05-11 NOTE — Progress Notes (Signed)
Remote ICD transmission.   

## 2018-05-12 DIAGNOSIS — J449 Chronic obstructive pulmonary disease, unspecified: Secondary | ICD-10-CM | POA: Diagnosis not present

## 2018-05-17 ENCOUNTER — Other Ambulatory Visit: Payer: Self-pay | Admitting: Family Medicine

## 2018-05-20 ENCOUNTER — Ambulatory Visit (INDEPENDENT_AMBULATORY_CARE_PROVIDER_SITE_OTHER): Payer: Medicare Other

## 2018-05-20 ENCOUNTER — Telehealth: Payer: Self-pay

## 2018-05-20 DIAGNOSIS — I5042 Chronic combined systolic (congestive) and diastolic (congestive) heart failure: Secondary | ICD-10-CM

## 2018-05-20 DIAGNOSIS — Z95810 Presence of automatic (implantable) cardiac defibrillator: Secondary | ICD-10-CM

## 2018-05-20 NOTE — Telephone Encounter (Signed)
LMOVM reminding pt to send remote transmission.   

## 2018-05-21 DIAGNOSIS — I5042 Chronic combined systolic (congestive) and diastolic (congestive) heart failure: Secondary | ICD-10-CM | POA: Diagnosis not present

## 2018-05-21 DIAGNOSIS — Z95810 Presence of automatic (implantable) cardiac defibrillator: Secondary | ICD-10-CM

## 2018-05-21 NOTE — Progress Notes (Signed)
Overall better for her

## 2018-05-21 NOTE — Progress Notes (Signed)
EPIC Encounter for ICM Monitoring  Patient Name: Sydney Phillips is a 82 y.o. female Date: 05/21/2018 Primary Care Physican: Laurey Morale, MD Primary Cardiologist:Croitoru Electrophysiologist: Croitoru BiV Pacing: 97% (Effective 85.9%) Last Weight: 183lbs(ranges from 180-190 lbs) Today's Weight:  unknown       Attempted call to patient and unable to reach.    Transmission reviewed.    Thoracic impedance abnormal suggesting fluid accumulation.  Impedance has multiple episodes being below baseline in the last month.  Prescribed: Furosemide 80 mg2 tabletsevery morning and 1 tablet in the evening.Potassium 20 mEq 2tablets (40 mEq total)twice a day.  Labs: 02/01/2018 Creatinine 1.22, BUN 17, Potassium 3.4, Sodium 140, EGFR 45.07 07/30/2017 Creatinine1.55, BUN40, Potassium5.1, Sodium135 07/23/2017 Creatinine1.17, BUN34, Potassium3.8, MBBUYZ709, UKRC38-18  07/22/2017 Creatinine1.20, BUN35, Potassium4.4, MCRFVO360, OVPC34-03  07/21/2017 Creatinine1.30, BUN31, Potassium4.5, Sodium136, TCYE18-59  07/20/2017 Creatinine1.25, BUN26, Potassium5.4, MBPJPE162, OECX50-72  07/19/2017 Creatinine1.00, BUN22, Potassium4.2, Sodium140  07/18/2017 Creatinine1.14, BUN26, Potassium3.7, UVJDYN183, FPOI51-89  07/02/2017 Creatinine1.09, BUN17, Potassium3.2, Sodium141 A complete set of results can be found in Results Review  Recommendations: Unable to reach.  Follow-up plan: ICM clinic phone appointment on 05/31/2018 to recheck fluid levels.    Copy of ICM check sent to Dr. Sallyanne Kuster.   3 month ICM trend: 05/20/2018    1 Year ICM trend:       [name], RN 05/21/2018 11:43 AM

## 2018-05-24 ENCOUNTER — Ambulatory Visit (INDEPENDENT_AMBULATORY_CARE_PROVIDER_SITE_OTHER): Payer: Medicare Other | Admitting: Family Medicine

## 2018-05-24 ENCOUNTER — Encounter: Payer: Self-pay | Admitting: Family Medicine

## 2018-05-24 VITALS — BP 120/60 | HR 85 | Temp 98.2°F | Wt 190.8 lb

## 2018-05-24 DIAGNOSIS — R739 Hyperglycemia, unspecified: Secondary | ICD-10-CM

## 2018-05-24 DIAGNOSIS — E1165 Type 2 diabetes mellitus with hyperglycemia: Secondary | ICD-10-CM

## 2018-05-24 DIAGNOSIS — E118 Type 2 diabetes mellitus with unspecified complications: Secondary | ICD-10-CM

## 2018-05-24 LAB — GLUCOSE, POCT (MANUAL RESULT ENTRY): POC Glucose: 150 mg/dl — AB (ref 70–99)

## 2018-05-24 MED ORDER — ALPRAZOLAM 1 MG PO TABS: 1.0000 mg | ORAL_TABLET | Freq: Two times a day (BID) | ORAL | 5 refills | Status: DC | PRN

## 2018-05-24 MED ORDER — GLIPIZIDE 5 MG PO TABS: 5.0000 mg | ORAL_TABLET | Freq: Two times a day (BID) | ORAL | 5 refills | Status: DC

## 2018-05-24 NOTE — Progress Notes (Signed)
   Subjective:    Patient ID: Sydney Phillips, female    DOB: 18-Feb-1936, 82 y.o.   MRN: [id number]  HPI Here for some elevated glucose readings in the last few weeks. She has had readings from the high 100s to the mid 200s. Today a random glucose is 236. Her last A1c on 05-10-18 was 7.6. She tries to watch her diet but she is on no diabetes medications. She did not tolerate Metformin due to GI side effects. She feels fairly well.    Review of Systems  Constitutional: Negative.   Respiratory: Negative.   Cardiovascular: Negative.   Neurological: Negative.        Objective:   Physical Exam  Constitutional: She is oriented to person, place, and time. She appears well-developed and well-nourished.  Cardiovascular: Normal rate, regular rhythm, normal heart sounds and intact distal pulses.  Pulmonary/Chest: Effort normal and breath sounds normal.  Neurological: She is alert and oriented to person, place, and time.          Assessment & Plan:  Her diabetes is not well controlled. She will start on Glipizide 5 mg bid. Recheck in one month. Alysia Penna, MD

## 2018-05-31 ENCOUNTER — Ambulatory Visit (INDEPENDENT_AMBULATORY_CARE_PROVIDER_SITE_OTHER): Payer: Medicare Other

## 2018-05-31 DIAGNOSIS — I5042 Chronic combined systolic (congestive) and diastolic (congestive) heart failure: Secondary | ICD-10-CM

## 2018-05-31 DIAGNOSIS — Z95810 Presence of automatic (implantable) cardiac defibrillator: Secondary | ICD-10-CM

## 2018-05-31 NOTE — Progress Notes (Signed)
EPIC Encounter for ICM Monitoring  Patient Name: Sydney Phillips is a 82 y.o. female Date: 05/31/2018 Primary Care Physican: Laurey Morale, MD Primary Cardiologist:Croitoru Electrophysiologist: Croitoru BiV Pacing: 98% (Effective92%) Last Weight:183lbs(ranges from 180-190 lbs) Today's Weight:  unknown                                                          Transmission reviewed.    Thoracic impedance returned to normal since last ICM remote transmission on 05/20/2018.  Prescribed: Furosemide 80 mg2 tabletsevery morning and 1 tablet in the evening.Potassium 20 mEq 2tablets (40 mEq total)twice a day.  Labs: 02/01/2018 Creatinine 1.22, BUN 17, Potassium 3.4, Sodium 140, EGFR 45.07 07/30/2017 Creatinine1.55, BUN40, Potassium5.1, Sodium135 07/23/2017 Creatinine1.17, BUN34, Potassium3.8, VGCYOY241, ZBFM10-40  07/22/2017 Creatinine1.20, BUN35, Potassium4.4, EBVPLW859, VUFC14-43  07/21/2017 Creatinine1.30, BUN31, Potassium4.5, Sodium136, QIXM58-00  07/20/2017 Creatinine1.25, BUN26, Potassium5.4, YJGZQJ447, XFPK44-17  07/19/2017 Creatinine1.00, BUN22, Potassium4.2, Sodium140  07/18/2017 Creatinine1.14, BUN26, Potassium3.7, [id number], DQVH00-16  07/02/2017 Creatinine1.09, BUN17, Potassium3.2, Sodium141 A complete set of results can be found in Results Review  Recommendations: None  Follow-up plan: ICM clinic phone appointment on 07/05/2018.    Copy of ICM check sent to Dr. Sallyanne Kuster.    3 month ICM trend: 05/31/2018    1 Year ICM trend:       [name], RN 05/31/2018 4:44 PM

## 2018-05-31 NOTE — Progress Notes (Signed)
Seems to be behaving better and better this year

## 2018-06-11 DIAGNOSIS — J449 Chronic obstructive pulmonary disease, unspecified: Secondary | ICD-10-CM | POA: Diagnosis not present

## 2018-06-18 ENCOUNTER — Encounter: Payer: Self-pay | Admitting: Internal Medicine

## 2018-06-18 ENCOUNTER — Ambulatory Visit (INDEPENDENT_AMBULATORY_CARE_PROVIDER_SITE_OTHER): Payer: Medicare Other | Admitting: Internal Medicine

## 2018-06-18 DIAGNOSIS — L97521 Non-pressure chronic ulcer of other part of left foot limited to breakdown of skin: Secondary | ICD-10-CM

## 2018-06-18 DIAGNOSIS — L97509 Non-pressure chronic ulcer of other part of unspecified foot with unspecified severity: Secondary | ICD-10-CM

## 2018-06-18 DIAGNOSIS — E11621 Type 2 diabetes mellitus with foot ulcer: Secondary | ICD-10-CM | POA: Diagnosis not present

## 2018-06-18 MED ORDER — DOXYCYCLINE HYCLATE 100 MG PO TABS: 100.0000 mg | ORAL_TABLET | Freq: Two times a day (BID) | ORAL | 0 refills | Status: DC

## 2018-06-18 NOTE — Assessment & Plan Note (Signed)
Rx for doxycycline for 5 days given allergies and intolerances to medications and antibiotics. No evidence of cellulitis. Bandaged in the office and given advice for wound care. Advised to follow up in 2 weeks with PCP for wound check. Wear shoes at all times.

## 2018-06-18 NOTE — Patient Instructions (Signed)
We have wrapped the toe today.   We have sent in doxycycline to take 1 pill twice a day for 5 days.  Change the bandage every 3-4 days or so and soak with water for 5-10 minutes prior to trying to remove bandage.   Come back in 2-3 weeks with your doctor to make sure this is healing.

## 2018-06-18 NOTE — Progress Notes (Signed)
   Subjective:   Patient ID: Sydney Phillips, female    DOB: 1936/05/28, 83 y.o.   MRN: [id number]  HPI The patient is an 83 YO female coming in for foot wound with concurrent diabetes. She had a cut on her foot from something and was covering with bandages. When she ran out of these she used some toilet tissue and tape. She went to remove this about 1-2 days ago and tore the skin around the wound. This bled quite a lot. She was able to hold pressure and get this to stop. She then bandaged it with a bandage. She is having some pain in the area. Denies drainage. Denies fevers or chills. Denies rash on the foot.   Review of Systems  Constitutional: Positive for activity change. Negative for appetite change, chills, fatigue, fever and unexpected weight change.  Respiratory: Negative.   Cardiovascular: Negative.   Gastrointestinal: Negative.   Musculoskeletal: Positive for myalgias. Negative for arthralgias, back pain, gait problem and joint swelling.  Skin: Positive for wound. Negative for color change, pallor and rash.  Neurological: Negative.     Objective:  Physical Exam Constitutional:      Appearance: She is well-developed.  HENT:     Head: Normocephalic and atraumatic.  Neck:     Musculoskeletal: Normal range of motion.  Cardiovascular:     Rate and Rhythm: Normal rate and regular rhythm.  Pulmonary:     Effort: Pulmonary effort is normal. No respiratory distress.     Breath sounds: Normal breath sounds. No wheezing or rales.  Skin:    General: Skin is warm and dry.     Comments: Cut at the base of the toe left foot, with breakdown of the surrounding skin in a circular fashion around the entire toe except the dorsal portion. No cellulitis evident or tracking rash. No abscess and no bone palpated.   Neurological:     Mental Status: She is alert and oriented to person, place, and time.     Coordination: Coordination normal.     Vitals:   06/18/18 1335  BP: (!) 150/80    Pulse: 98  Temp: 97.8 F (36.6 C)  TempSrc: Oral  SpO2: 93%  Weight: 190 lb (86.2 kg)  Height: 5' 5.5" (1.664 m)    Assessment & Plan:

## 2018-07-02 LAB — CUP PACEART REMOTE DEVICE CHECK
Battery Remaining Longevity: 65 mo
Brady Statistic AS VS Percent: 0.72 %
Brady Statistic RA Percent Paced: 2.5 %
Brady Statistic RV Percent Paced: 98.34 %
Date Time Interrogation Session: 20191126102826
HighPow Impedance: 55 Ohm
HighPow Impedance: 66 Ohm
Implantable Lead Implant Date: 20091202
Implantable Lead Location: 753858
Implantable Lead Location: 753859
Implantable Lead Model: 185
Implantable Lead Model: 4194
Implantable Lead Serial Number: 635533
Implantable Pulse Generator Implant Date: 20180919
Lead Channel Impedance Value: 703 Ohm
Lead Channel Pacing Threshold Amplitude: 0.5 V
Lead Channel Pacing Threshold Amplitude: 0.625 V
Lead Channel Sensing Intrinsic Amplitude: 3.75 mV
Lead Channel Sensing Intrinsic Amplitude: 3.75 mV
Lead Channel Setting Pacing Pulse Width: 0.4 ms
Lead Channel Setting Sensing Sensitivity: 0.3 mV
MDC IDC LEAD IMPLANT DT: 20091202
MDC IDC LEAD IMPLANT DT: 20091202
MDC IDC LEAD LOCATION: 753860
MDC IDC LEAD SERIAL: 308680
MDC IDC MSMT BATTERY VOLTAGE: 2.97 V
MDC IDC MSMT LEADCHNL LV IMPEDANCE VALUE: 304 Ohm
MDC IDC MSMT LEADCHNL LV IMPEDANCE VALUE: 418 Ohm
MDC IDC MSMT LEADCHNL LV IMPEDANCE VALUE: 551 Ohm
MDC IDC MSMT LEADCHNL LV PACING THRESHOLD AMPLITUDE: 2.125 V
MDC IDC MSMT LEADCHNL LV PACING THRESHOLD PULSEWIDTH: 1 ms
MDC IDC MSMT LEADCHNL RA IMPEDANCE VALUE: 456 Ohm
MDC IDC MSMT LEADCHNL RA PACING THRESHOLD PULSEWIDTH: 0.4 ms
MDC IDC MSMT LEADCHNL RV IMPEDANCE VALUE: 703 Ohm
MDC IDC MSMT LEADCHNL RV PACING THRESHOLD PULSEWIDTH: 0.4 ms
MDC IDC MSMT LEADCHNL RV SENSING INTR AMPL: 28.125 mV
MDC IDC MSMT LEADCHNL RV SENSING INTR AMPL: 28.125 mV
MDC IDC SET LEADCHNL LV PACING AMPLITUDE: 2.5 V
MDC IDC SET LEADCHNL LV PACING PULSEWIDTH: 1 ms
MDC IDC SET LEADCHNL RA PACING AMPLITUDE: 1.5 V
MDC IDC SET LEADCHNL RV PACING AMPLITUDE: 2 V
MDC IDC STAT BRADY AP VP PERCENT: 2.47 %
MDC IDC STAT BRADY AP VS PERCENT: 0.04 %
MDC IDC STAT BRADY AS VP PERCENT: 96.77 %

## 2018-07-05 ENCOUNTER — Ambulatory Visit (INDEPENDENT_AMBULATORY_CARE_PROVIDER_SITE_OTHER): Payer: Medicare Other

## 2018-07-05 DIAGNOSIS — I5042 Chronic combined systolic (congestive) and diastolic (congestive) heart failure: Secondary | ICD-10-CM

## 2018-07-05 DIAGNOSIS — Z95810 Presence of automatic (implantable) cardiac defibrillator: Secondary | ICD-10-CM

## 2018-07-05 NOTE — Progress Notes (Signed)
EPIC Encounter for ICM Monitoring  Patient Name: Sydney Phillips is a 83 y.o. female Date: 07/05/2018 Primary Care Physican: Laurey Morale, MD Primary Cardiologist:Croitoru Electrophysiologist: Croitoru BiV Pacing: 98% (Effective82.1%) Last Weight:183lbs(ranges from 180-190 lbs) Today's Weight:  unknown                                                           Attempted call to patient and unable to reach.    Transmission reviewed.    Thoracic impedance abnormal suggesting fluid accumulation starting 07/04/2018  Prescribed: Furosemide 80 mg2 tabletsevery morning and 1 tablet in the evening.Potassium 20 mEq 2tablets (40 mEq total)twice a day.  Labs: 02/01/2018 Creatinine 1.22, BUN 17, Potassium 3.4, Sodium 140, EGFR 45.07 07/30/2017 Creatinine1.55, BUN40, Potassium5.1, Sodium135 07/23/2017 Creatinine1.17, BUN34, Potassium3.8, [id number], SPJS41-99  07/22/2017 Creatinine1.20, BUN35, Potassium4.4, VACQPE483, TYVD73-22  07/21/2017 Creatinine1.30, BUN31, Potassium4.5, Sodium136, VOHC09-19  07/20/2017 Creatinine1.25, BUN26, Potassium5.4, CKICHT981, SYVG86-28  07/19/2017 Creatinine1.00, BUN22, Potassium4.2, Sodium140  07/18/2017 Creatinine1.14, BUN26, Potassium3.7, OOJZBF010, [id number]  07/02/2017 Creatinine1.09, BUN17, Potassium3.2, Sodium141 A complete set of results can be found in Results Review  Recommendations: Unable to reach.  Follow-up plan: ICM clinic phone appointment on 08/10/2018.  Copy of ICM check sent to Dr. Sallyanne Kuster.   3 month ICM trend: 07/05/2018    1 Year ICM trend:       [name], RN 07/05/2018 5:06 PM

## 2018-07-06 ENCOUNTER — Telehealth: Payer: Self-pay

## 2018-07-06 NOTE — Telephone Encounter (Signed)
Remote ICM transmission received.  Attempted call to patient regarding ICM remote transmission and left detailed message, per DPR, with next ICM remote transmission date of 08/10/2018.  Advised to return call for any fluid symptoms or questions.    

## 2018-07-12 DIAGNOSIS — J449 Chronic obstructive pulmonary disease, unspecified: Secondary | ICD-10-CM | POA: Diagnosis not present

## 2018-07-15 LAB — CUP PACEART INCLINIC DEVICE CHECK
Date Time Interrogation Session: 20200130132114
Implantable Lead Implant Date: 20091202
Implantable Lead Implant Date: 20091202
Implantable Lead Location: 753859
Implantable Lead Model: 4194
Implantable Lead Model: 4470
Implantable Lead Serial Number: 308680
Implantable Lead Serial Number: 635533
Implantable Pulse Generator Implant Date: 20180919
MDC IDC LEAD IMPLANT DT: 20091202
MDC IDC LEAD LOCATION: 753858
MDC IDC LEAD LOCATION: 753860

## 2018-07-26 IMAGING — CT CT HEAD W/O CM
3 series · 15 of 47 positions shown, 18 images · non-contrast
Comparison: CT brain scan of 11/30/2013

CLINICAL DATA: Numerous falls, the patient is on anticoagulation

EXAM:
CT HEAD WITHOUT CONTRAST
TECHNIQUE: Contiguous axial images were obtained from the base of the skull
through the vertex without intravenous contrast.

[Series 2: head 5.0 h30s · axial · 0.50mm/px · z∈[-102,+43]mm · 9 of 35 slices shown, 12 images]
[im 3/35  brain]
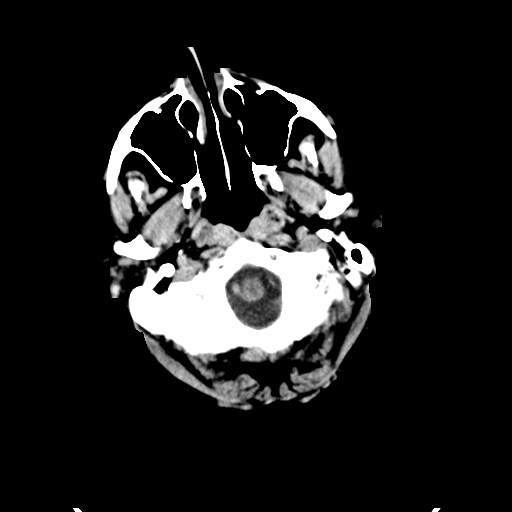
[im 3/35  bone]
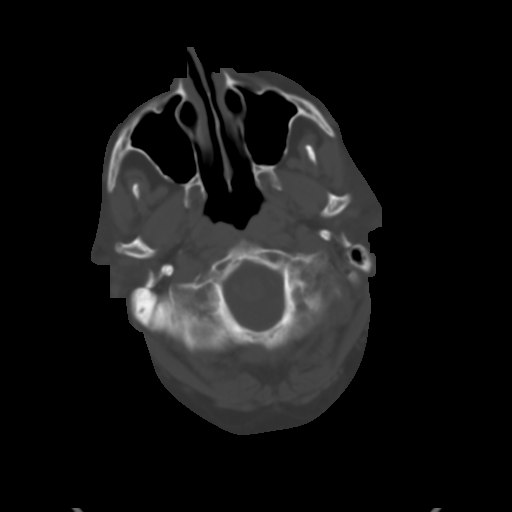
[im 6/35  brain]
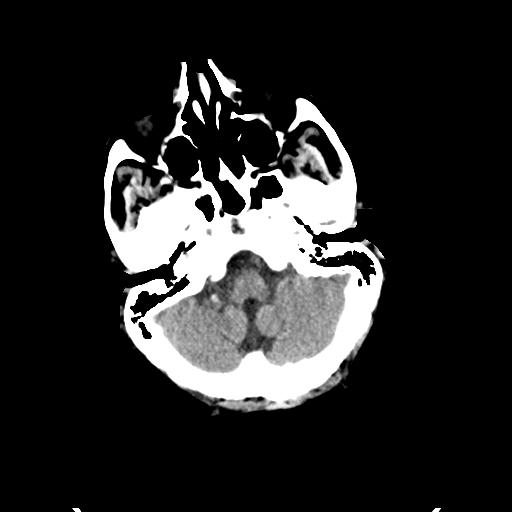
[im 10/35  brain]
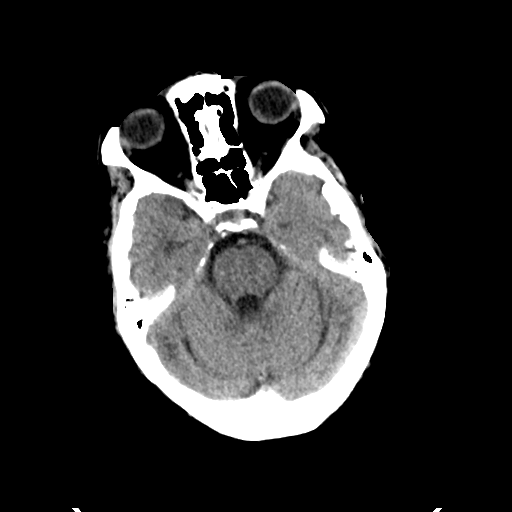
[im 13/35  brain]
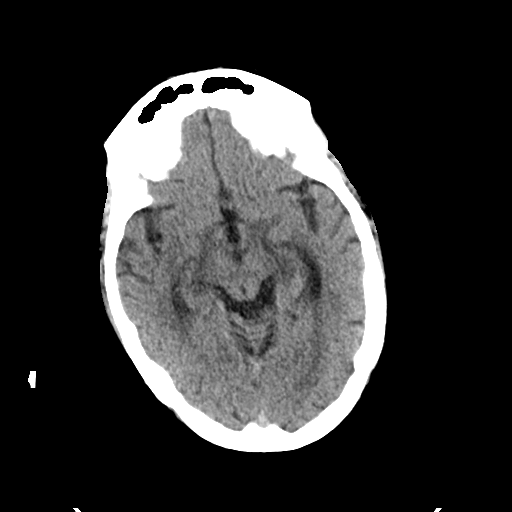
[im 18/35  brain]
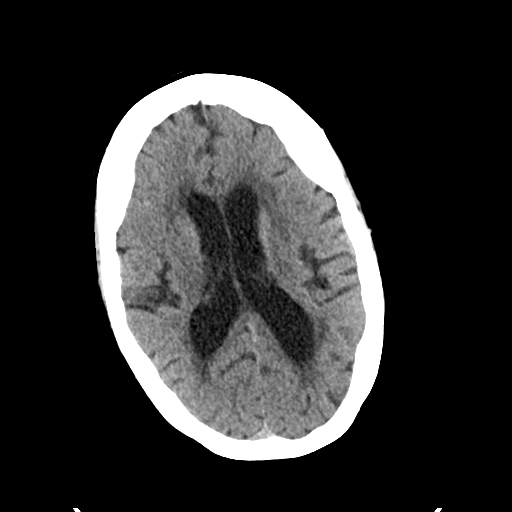
[im 18/35  bone]
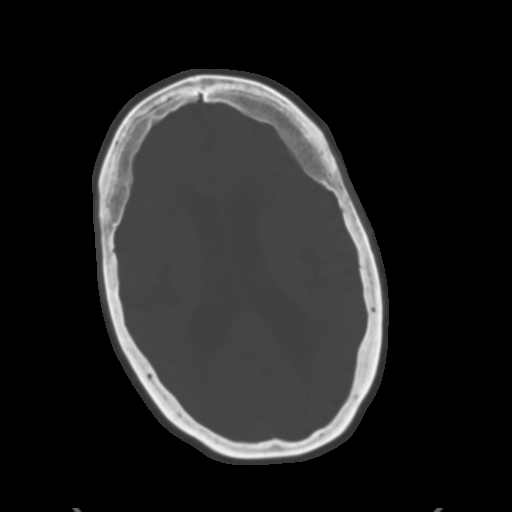
[im 22/35  brain]
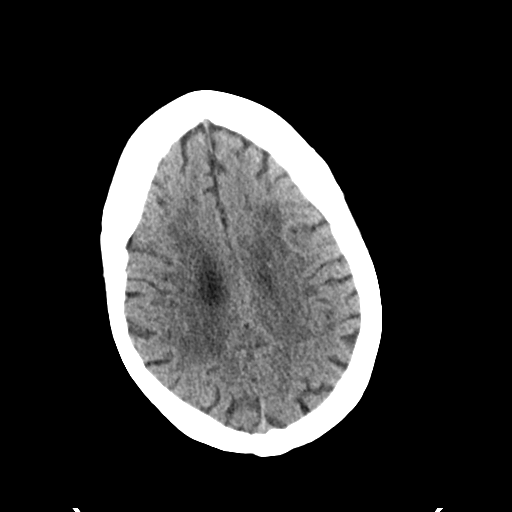
[im 25/35  brain]
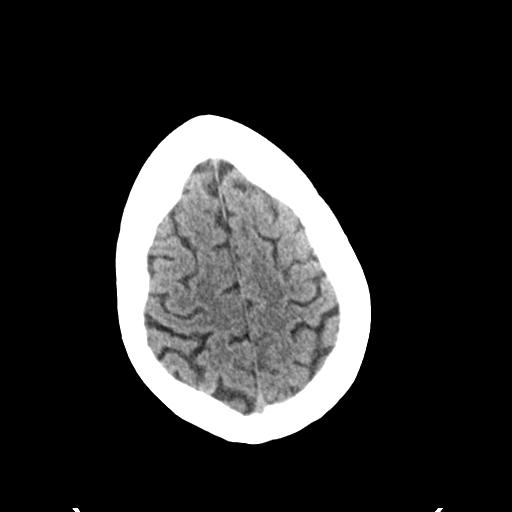
[im 29/35  brain]
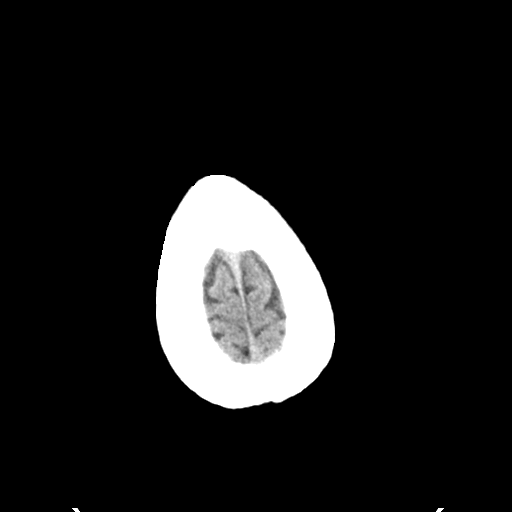
[im 32/35  brain]
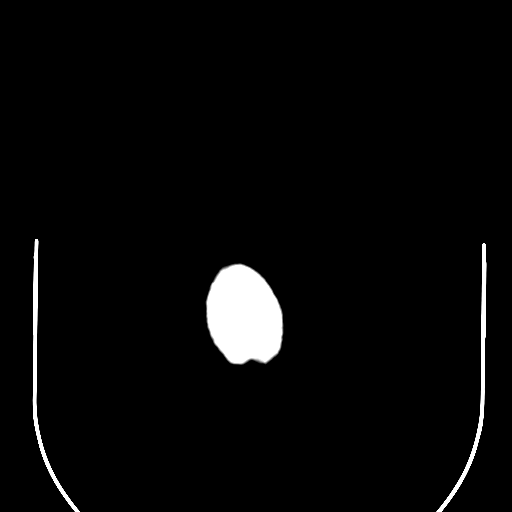
[im 32/35  bone]
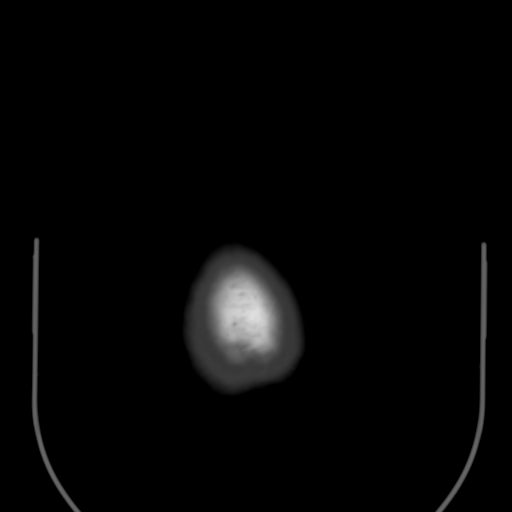

[Series 4: head 3.0 mpr · coronal · 0.38mm/px · 3 of 66 slices shown (1 of 2)]
[im 22/66  brain]
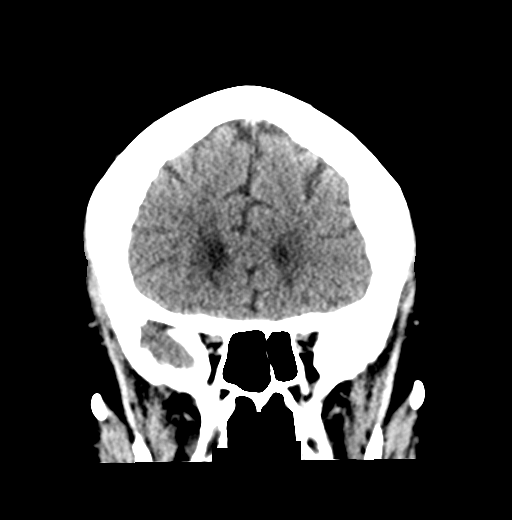
[im 29/66  brain]
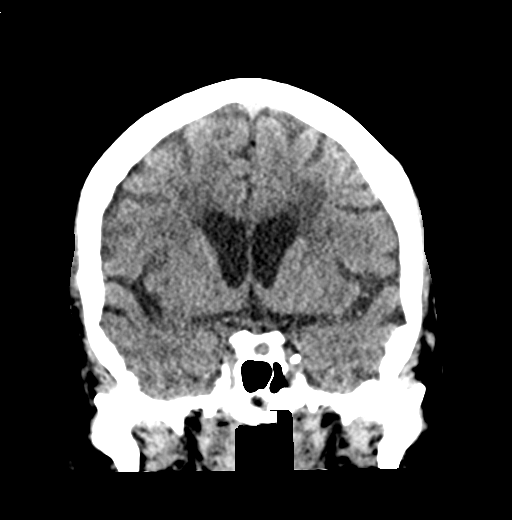
[im 37/66  brain]
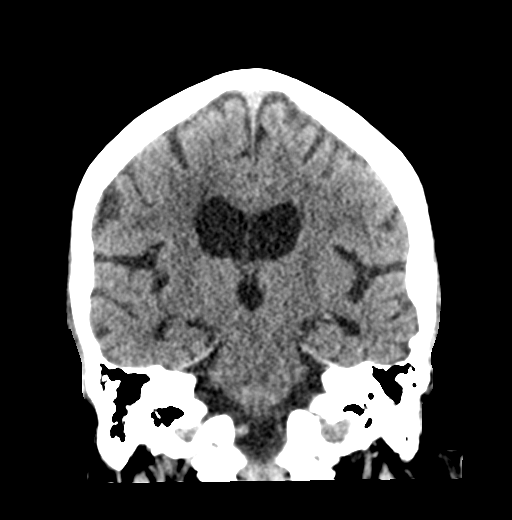

[Series 5: head 3.0 mpr · sagittal · 0.34mm/px · 3 of 65 slices shown (2 of 2)]
[im 22/65  brain]
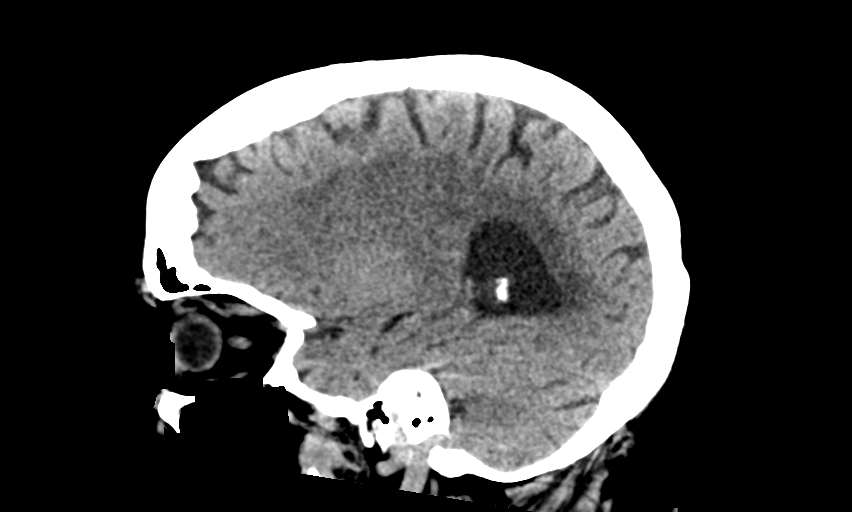
[im 33/65  brain]
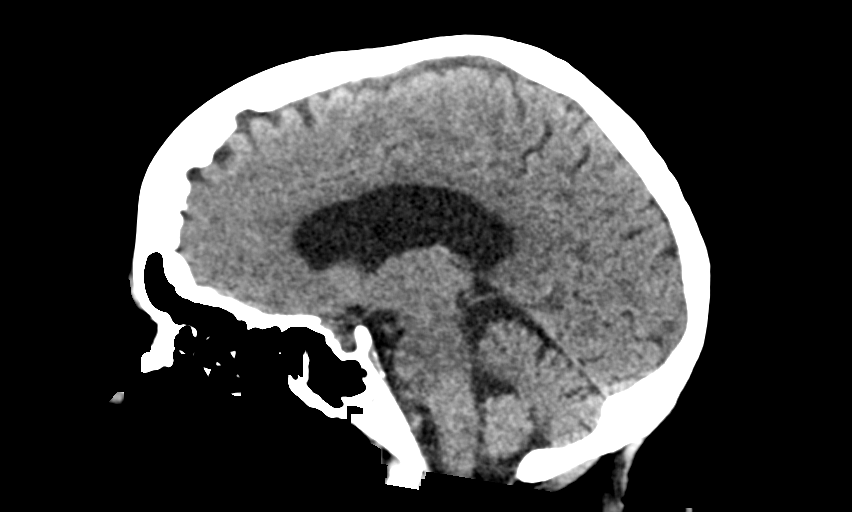
[im 43/65  brain]
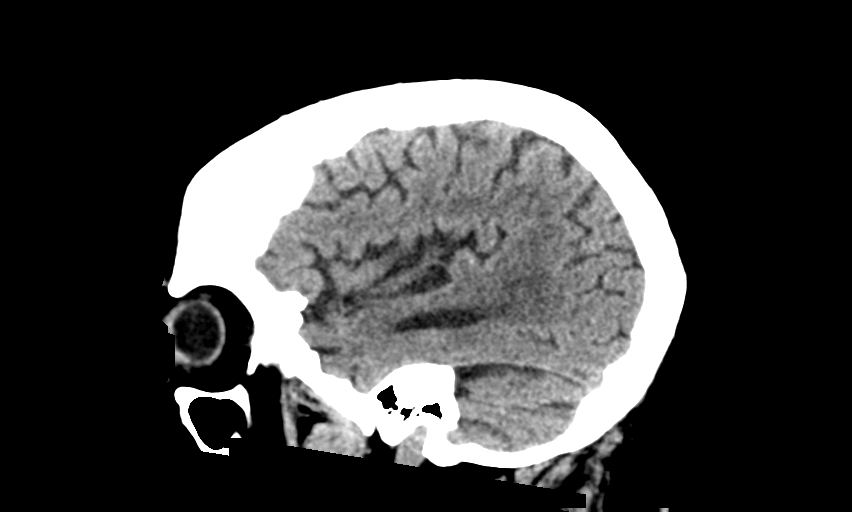

[15 of 47 positions shown; findings below may reference images not displayed]

FINDINGS: Brain: The ventricular system remains prominent, as are the cortical
sulci, indicative of diffuse atrophy. The septum is midline in
position. Moderate small vessel ischemic change is noted throughout
the periventricular white matter. No hemorrhage, mass lesion, or
acute infarction is seen.

Vascular: On this unenhanced study, no vascular abnormality is
noted.

Skull: On bone window images, no calvarial abnormality is seen.

Sinuses/Orbits: The paranasal sinuses are pneumatized.

Other: None
IMPRESSION: Atrophy and small vessel ischemic change. No acute intracranial
abnormality.

## 2018-07-27 ENCOUNTER — Ambulatory Visit (INDEPENDENT_AMBULATORY_CARE_PROVIDER_SITE_OTHER): Payer: Medicare Other | Admitting: Family Medicine

## 2018-07-27 ENCOUNTER — Encounter: Payer: Self-pay | Admitting: Family Medicine

## 2018-07-27 VITALS — BP 140/58 | HR 71 | Temp 98.6°F | Wt 191.4 lb

## 2018-07-27 DIAGNOSIS — J209 Acute bronchitis, unspecified: Secondary | ICD-10-CM

## 2018-07-27 MED ORDER — ACCU-CHEK FASTCLIX LANCETS MISC: 5 refills | Status: AC

## 2018-07-27 MED ORDER — DOXYCYCLINE HYCLATE 100 MG PO CAPS: 100.0000 mg | ORAL_CAPSULE | Freq: Two times a day (BID) | ORAL | 0 refills | Status: AC

## 2018-07-27 NOTE — Progress Notes (Signed)
   Subjective:    Patient ID: Sydney Phillips, female    DOB: [birth year], 83 y.o.   MRN: [id number]  HPI Here for 4 days of chest congestion and a dry cough. No fever. On Mucinex.    Review of Systems  Constitutional: Negative.   HENT: Positive for congestion and postnasal drip. Negative for sinus pressure, sinus pain and sore throat.   Eyes: Negative.   Respiratory: Positive for cough and chest tightness.        Objective:   Physical Exam Constitutional:      Appearance: Normal appearance.  HENT:     Right Ear: Tympanic membrane and ear canal normal.     Left Ear: Tympanic membrane and ear canal normal.     Nose: Nose normal.     Mouth/Throat:     Pharynx: Oropharynx is clear.  Eyes:     Conjunctiva/sclera: Conjunctivae normal.  Pulmonary:     Effort: Pulmonary effort is normal. No respiratory distress.     Breath sounds: Rhonchi present. No wheezing or rales.  Lymphadenopathy:     Cervical: No cervical adenopathy.  Neurological:     Mental Status: She is alert.           Assessment & Plan:  Bronchitis, treat with Doxycycline. Alysia Penna, MD

## 2018-08-04 ENCOUNTER — Other Ambulatory Visit: Payer: Self-pay | Admitting: Family Medicine

## 2018-08-10 ENCOUNTER — Ambulatory Visit (INDEPENDENT_AMBULATORY_CARE_PROVIDER_SITE_OTHER): Payer: Medicare Other | Admitting: *Deleted

## 2018-08-10 DIAGNOSIS — I255 Ischemic cardiomyopathy: Secondary | ICD-10-CM | POA: Diagnosis not present

## 2018-08-11 ENCOUNTER — Ambulatory Visit (INDEPENDENT_AMBULATORY_CARE_PROVIDER_SITE_OTHER): Payer: Medicare Other

## 2018-08-11 DIAGNOSIS — I5042 Chronic combined systolic (congestive) and diastolic (congestive) heart failure: Secondary | ICD-10-CM

## 2018-08-11 DIAGNOSIS — Z95810 Presence of automatic (implantable) cardiac defibrillator: Secondary | ICD-10-CM | POA: Diagnosis not present

## 2018-08-11 LAB — CUP PACEART REMOTE DEVICE CHECK
Battery Remaining Longevity: 62 mo
Battery Voltage: 2.97 V
Brady Statistic AP VP Percent: 3.12 %
Brady Statistic AP VS Percent: 0.11 %
Brady Statistic AS VP Percent: 94.63 %
Brady Statistic AS VS Percent: 2.14 %
Brady Statistic RA Percent Paced: 3.2 %
Brady Statistic RV Percent Paced: 96.26 %
Date Time Interrogation Session: 20200225062306
HighPow Impedance: 54 Ohm
HighPow Impedance: 67 Ohm
Implantable Lead Implant Date: 20091202
Implantable Lead Implant Date: 20091202
Implantable Lead Implant Date: 20091202
Implantable Lead Location: 753858
Implantable Lead Location: 753859
Implantable Lead Location: 753860
Implantable Lead Model: 185
Implantable Lead Model: 4194
Implantable Lead Model: 4470
Implantable Lead Serial Number: 635533
Implantable Pulse Generator Implant Date: 20180919
Lead Channel Impedance Value: 285 Ohm
Lead Channel Impedance Value: 456 Ohm
Lead Channel Impedance Value: 551 Ohm
Lead Channel Impedance Value: 722 Ohm
Lead Channel Impedance Value: 722 Ohm
Lead Channel Pacing Threshold Amplitude: 0.5 V
Lead Channel Pacing Threshold Amplitude: 0.625 V
Lead Channel Pacing Threshold Amplitude: 3 V
Lead Channel Pacing Threshold Pulse Width: 0.4 ms
Lead Channel Pacing Threshold Pulse Width: 1 ms
Lead Channel Sensing Intrinsic Amplitude: 2.875 mV
Lead Channel Sensing Intrinsic Amplitude: 2.875 mV
Lead Channel Sensing Intrinsic Amplitude: 24.75 mV
Lead Channel Sensing Intrinsic Amplitude: 24.75 mV
Lead Channel Setting Pacing Amplitude: 2 V
Lead Channel Setting Pacing Amplitude: 2.5 V
Lead Channel Setting Pacing Pulse Width: 1 ms
Lead Channel Setting Sensing Sensitivity: 0.3 mV
MDC IDC LEAD SERIAL: 308680
MDC IDC MSMT LEADCHNL LV IMPEDANCE VALUE: 418 Ohm
MDC IDC MSMT LEADCHNL RA PACING THRESHOLD PULSEWIDTH: 0.4 ms
MDC IDC SET LEADCHNL RA PACING AMPLITUDE: 1.5 V
MDC IDC SET LEADCHNL RV PACING PULSEWIDTH: 0.4 ms

## 2018-08-12 ENCOUNTER — Telehealth: Payer: Self-pay | Admitting: *Deleted

## 2018-08-12 DIAGNOSIS — J449 Chronic obstructive pulmonary disease, unspecified: Secondary | ICD-10-CM | POA: Diagnosis not present

## 2018-08-12 NOTE — Telephone Encounter (Signed)
-----   Message from [name], MD sent at 08/12/2018  3:17 PM EST ----- Remote reviewed.   Not pacemaker dependent. Battery status is good. Lead measurements are stable, but LV capture threshold appears to have increased with reduced effective BiV pacing (96% biV paced, but only 70% effective) Heart rate histogram is favorable. No clinically significant episodes of high ventricular rate or atrial mode switch noted. Rare NSVT.  Can we please bring in to device clinic or to my Wednesday morning clinic for a manual LV pacing threshold check and output adjustment, whichever we can arrange sooner.

## 2018-08-12 NOTE — Telephone Encounter (Signed)
LMOM requesting call back to the DC. Gave direct number.

## 2018-08-13 ENCOUNTER — Telehealth: Payer: Self-pay

## 2018-08-13 NOTE — Telephone Encounter (Signed)
Remote ICM transmission received.  Attempted call to patient regarding ICM remote transmission and left message, per DPR, to return call.    

## 2018-08-13 NOTE — Telephone Encounter (Signed)
Patient is scheduled to see Dr. Sallyanne Kuster on 08/19/18.

## 2018-08-13 NOTE — Progress Notes (Signed)
EPIC Encounter for ICM Monitoring  Patient Name: Sydney Phillips is a 83 y.o. female Date: 08/13/2018 Primary Care Physican: Laurey Morale, MD Primary Cardiologist:Croitoru Electrophysiologist: Croitoru BiV Pacing: 96.3% (Effective69.6 % which is a decrease from 82.1%) Last Weight:183lbs(ranges from 180-190 lbs) Today's Weight: unknown   OBSERVATIONS (6)   CRT Pacing is effective less than 90% of the time.   LV Capture Management determined that threshold increased by ??? V from 24-Jul-2018 to 10-Aug-2018. This increase was greater than Amplitude Safety Margin (+0 V) and may have compromised capture.   Unable to measure LV thresholds in last 7 days.   RV Capture Management: Actual safety margin (3.2 X) > programmed margin (2 X).   Night heart rate over 85 bpm for 7 days.   Patient Activity less than 1 hr/day for 5 weeks.   Attempted call to patient and unable to reach.Transmission reviewed.   Thoracic impedance abnormal since 07/06/2018 with exception of 1 day at baseline.  Prescribed:Furosemide 80 mg2 tabletsevery morning and 1 tablet in the evening.Potassium 20 mEq 2tablets (40 mEq total)twice a day.  Labs: 04/23/2018 Creatinine 1.33, BUN 25, Potassium 4.3, Sodium 141, GFR 38-44 02/01/2018 Creatinine 1.22, BUN 17, Potassium 3.4, Sodium 140, GFR 45.07 07/30/2017 Creatinine1.55, BUN40, Potassium5.1, Sodium135 07/23/2017 Creatinine1.17, BUN34, Potassium3.8, [id number], GFR43-50  07/22/2017 Creatinine1.20, BUN35, Potassium4.4, HWEXHB716, RCV89-38  07/21/2017 Creatinine1.30, BUN31, Potassium4.5, Sodium136, BOF75-10  07/20/2017 Creatinine1.25, BUN26, Potassium5.4, CHENID782, UMP53-61  07/19/2017 Creatinine1.00, BUN22, Potassium4.2, Sodium140  07/18/2017 Creatinine1.14, BUN26, Potassium3.7, WERXVQ008, QPY19-50  07/02/2017 Creatinine1.09, BUN17, Potassium3.2, Sodium141 A complete set of results can be found in  Results Review  Recommendations:Unable to reach.     Follow-up plan: ICM clinic phone appointment on3/16/2020 to recheck fluid levels since she has office defib check with Dr Sallyanne Kuster 08/19/2018.  Copy of ICM check sent to Dr. Sallyanne Kuster for review and if any recommendations will call back.   3 month ICM trend: 08/10/2018    1 Year ICM trend:       [name], RN 08/13/2018 12:31 PM

## 2018-08-16 ENCOUNTER — Other Ambulatory Visit: Payer: Self-pay | Admitting: Family Medicine

## 2018-08-17 ENCOUNTER — Encounter: Payer: Self-pay | Admitting: Cardiology

## 2018-08-17 NOTE — Progress Notes (Signed)
Remote ICD transmission.   

## 2018-08-19 ENCOUNTER — Ambulatory Visit (INDEPENDENT_AMBULATORY_CARE_PROVIDER_SITE_OTHER): Payer: Medicare Other | Admitting: Cardiovascular Disease

## 2018-08-19 ENCOUNTER — Encounter: Payer: Self-pay | Admitting: Cardiovascular Disease

## 2018-08-19 VITALS — BP 118/62 | HR 97 | Ht 65.5 in | Wt 189.2 lb

## 2018-08-19 DIAGNOSIS — I5042 Chronic combined systolic (congestive) and diastolic (congestive) heart failure: Secondary | ICD-10-CM

## 2018-08-19 DIAGNOSIS — I739 Peripheral vascular disease, unspecified: Secondary | ICD-10-CM

## 2018-08-19 DIAGNOSIS — E7800 Pure hypercholesterolemia, unspecified: Secondary | ICD-10-CM | POA: Diagnosis not present

## 2018-08-19 DIAGNOSIS — Z95810 Presence of automatic (implantable) cardiac defibrillator: Secondary | ICD-10-CM

## 2018-08-19 DIAGNOSIS — I2510 Atherosclerotic heart disease of native coronary artery without angina pectoris: Secondary | ICD-10-CM

## 2018-08-19 DIAGNOSIS — J449 Chronic obstructive pulmonary disease, unspecified: Secondary | ICD-10-CM

## 2018-08-19 NOTE — Progress Notes (Signed)
Patient ID: Sydney Phillips, female   DOB: [birth year], 83 y.o.   MRN: [id number]    Cardiology Office Note    Date:  08/19/2018   ID:  Sydney Phillips, Sydney Phillips Nov 23, 1935, MRN [id number]  PCP:  Sydney Morale, MD  Cardiologist:   Sydney Klein, MD   Chief Complaint  Patient presents with  . Pacemaker Problem  . Congestive Heart Failure    History of Present Illness:  Sydney Phillips is a 83 y.o. female with cardiomyopathy here for CHF follow up, check on CRT-D device, Repeat CHF hospitalizations in November 2017, December 2017,January 2018, July 2018, February 2019.   It is been a much better 12 months for Sydney Phillips, without any hospitalizations since last February.  We have had to make numerous adjustments in her diuretic doses and heart failure device follow-up has been very helpful in preventing acute heart failure events.  Her OptiVol has shown frequent broad swings, but we have generally been able to intervene before she got really sick.  Asked her to come to the office today because her defibrillator has shown a prolonged period of fluid overload associated with a reduction in the percentage of effective CRT.  At one point effective CRT had decreased to about 70%, despite a total biventricular pacing percentage of better than 95%.  This was due to a period of increased pacing capture threshold on the left ventricular lead.  This is at least the second time that such simultaneous loss of CRT and increase and thoracic impedance increase has been recorded  Over the last few weeks she has had problems with her "sinuses and wheezing" and believes she improved after treatment with antibiotics.  Judging by the progression of her OptiVol this was probably an episode of heart failure exacerbation.  Her thoracic impedance has been abnormal since February 1, but is finally trending back towards her baseline and is almost in the normal range.  Remarkably, this has not been associated with any  meaningful changes in her weight which has been quite steady in the 185-187 lb range of her home scale.  On our office scale today she was 189 pounds (3 pounds more than her home scale) very similar to her last office visit in November when she weighed 190 pounds.  She continues to sleep on 2 pillows.  She denies leg edema.  She reports she has received a new scale from her insurance company, together with a pulse oximeter, but has yet to start using it.  She reports good compliance with diuretics, although she does note that sometimes it works better, sometimes less.  She states that she no longer eats any canned foods and especially frozen foods.  The only saltshaker in the house is in her son's bedroom.  Other than described above, interrogation of her Medtronic Claria CRT-D device implanted as a generator change out in September 2018 shows generally favorable findings.  Estimated generator longevity 5.0 years (after we increased the LV output today).  She has 97% ventricular pacing (on the average 80-85% effective, recently improved after bottoming out and it 70% effective CRT a month ago).  She only has rare atrial pacing and the heart rate histograms are appropriate.  There have been no episodes of atrial fibrillation.  She has had very rare and very brief episodes of nonsustained ventricular tachycardia she has not received defibrillator discharges or other therapies.  LV pacing is in the LV tip-RV coil configuration with a relatively high threshold of  1.75 V at 1.0 ms and we increased the pacing after to 3.5 V at 1.0 ms.  She has mixed ischemic and nonischemic CMP and has been CRT "hyper-responder". In 2009 her ejection fraction was 20%. After CRT, her LV improved and in 2013 EF was 50% and she had several years of relative cardiac stability. LVEF in September 2017 was 40-45%, then dropped to 25-30% in November 2017 when she had NSTEMI, in the setting of COPD exacerbation, respiratory failure, acute  renal failure and syncope. Cardiac catheterization showed severe stenoses in the right coronary artery and left circumflex coronary artery both treated with drug-eluting stents. There was also moderate ostial stenosis of the LAD. Enrolled in Twilight study. Repeat echo in January 2018 showed EF 20-25% with a severely dilated left ventricle, moderate mitral regurgitation, systolic PA pressure 53 mmHg. Was readmitted on May 30 2016 with altered mental status, suspicion of steroid induced psychosis and discharged to skilled nursing facility, but again seen in the emergency room on December 19 after a fall with head injury. Gradually improved mental status and felt to be back at baseline by early January 2018. After that hospitalization for diuretics were held for acute renal insufficiency and she was admitted on January 11 with acute exacerbation of heart failure. Improved with diuretics and discharged on January 15 with a weight of 77.4 kg (171 lb).  February 2019, LVEF had partially rebounded to 35-40%.  Continues to require hospitalization for heart failure exacerbation on the average 2 or 3 times a year since then.  She has twin boys; Sydney Phillips has medical power of attorney.  Sydney Phillips accompanies her to the office today.  Past Medical History:  Diagnosis Date  . Anxiety   . Arthritis   . Asthma   . Automatic implantable cardioverter-defibrillator in situ   . Bronchitis   . CAD (coronary artery disease)    a. s/p DES to LCx/RCA 05/2016, ostial LAD disease.  . Cardiomyopathy ([place])   . Chronic back pain   . Chronic combined systolic and diastolic CHF (congestive heart failure) ([place])   . Chronic constipation   . Chronic pain   . CKD (chronic kidney disease), stage III ([place])   . Colon polyps 2003.  2015.   HP polyps 2003.  adnomas 2015.  required referal to [facility] for colonoscopic resection of flat polyps.   Marland Kitchen COPD (chronic obstructive pulmonary disease) ([place])   . Dementia ([place])   . Depression   .  Depression with anxiety    takes Cymbalta daily  . Diabetes mellitus ([place])   . Dyspnea   . Early cataracts, bilateral   . Fibromyalgia   . GERD (gastroesophageal reflux disease)    was on meds but was taken off;now watches what she eats  . Hemorrhoids   . History of kidney stones   . Hx of colonic polyps   . Hyperlipemia    takes Crestor daily  . Hypertension    takes Amlodipine and Metoprolol daily  . Insomnia   . LBBB (left bundle branch block)    Stress test 09/03/2010, EF 55  . Myocardial infarction ([place])   . PAD (peripheral artery disease) ([facility])    Carotid, subclavian, and lower extremity beds, currently not symptomatic  . Presence of combination internal cardiac defibrillator (ICD) and pacemaker   . Presence of permanent cardiac pacemaker   . Pulmonary hypertension ([place])   . S/P angioplasty with stent, lt. subclavian 07/31/11 08/01/2011  . Subclavian arterial stenosis, lt, with PTA/STENT 07/31/11  08/01/2011  . Syncope 07/28/2011   EF - 50-55, moderate concentric hypertrophy in left ventricle  . Urinary incontinence   . Vertigo    takes Meclizine prn    Past Surgical History:  Procedure Laterality Date  . ABDOMINAL AORTAGRAM N/A 08/12/2013   Procedure: ABDOMINAL Maxcine Ham;  Surgeon: Elam Dutch, MD;  Location[facility] C[facility] LAB;  Service: Cardiovascular;  Laterality: N/A;  . ABDOMINAL HYSTERECTOMY    . APPENDECTOMY    . BACK SURGERY  2012  . BIV ICD GENERTAOR CHANGE OUT Left 02/20/2012   Procedure: BIV ICD GENERTAOR CHANGE OUT;  Surgeon: Sydney Klein, MD;  Location: [facility] CATH LAB;  Service: Cardiovascular;  Laterality: Left;  . CARDIAC CATHETERIZATION  12/01/2007   By Dr. Melvern Banker, left heart cath,   . CARDIAC CATHETERIZATION N/A 05/20/2016   Procedure: Right/Left Heart Cath and Coronary Angiography;  Surgeon: Sherren Mocha, MD;  Location: [place] CV LAB;  Service: Cardiovascular;  Laterality: N/A;  . CARDIAC CATHETERIZATION N/A 05/20/2016   Procedure: Coronary Stent  Intervention;  Surgeon: Sherren Mocha, MD;  Location: [place] CV LAB;  Service: Cardiovascular;  Laterality: N/A;  . CARDIAC DEFIBRILLATOR PLACEMENT  05/2008   By Dr Blanch Media, [employer]tr[employer] CANNOT HAVE MRI's  . CAROTID ANGIOGRAM N/A 07/31/2011   Procedure: CAROTID ANGIOGRAM;  Surgeon: Lorretta Harp, MD;  Location: [facility] CATH LAB;  Service: Cardiovascular;  Laterality: N/A;  carotid angiogram and possible Lt SCA PTA  . COLONOSCOPY W/ POLYPECTOMY  12/2013  . CORONARY ANGIOPLASTY    . ENDARTERECTOMY Left 11/08/2014   Procedure: LEFT CAROTID ENDARTERECTOMY WITH HEMASHIELD PATCH ANGIOPLASTY;  Surgeon: Elam Dutch, MD;  Location: [place];  Service: Vascular;  Laterality: Left;  . ESOPHAGOGASTRODUODENOSCOPY N/A 01/20/2014   Procedure: ESOPHAGOGASTRODUODENOSCOPY (EGD);  Surgeon: Jerene Bears, MD;  Location: [facility] ENDOSCOPY;  Service: Endoscopy;  Laterality: N/A;  . FEMORAL-POPLITEAL BYPASS GRAFT Right 10/12/2013   Procedure:   Femoral-Peroneal trunk  bypass with nonreversed greater saphenous vein graft;  Surgeon: Elam Dutch, MD;  Location: [place];  Service: Vascular;  Laterality: Right;  . GIVENS CAPSULE STUDY N/A 01/20/2014   Procedure: GIVENS CAPSULE STUDY;  Surgeon: Jerene Bears, MD;  Location: [place];  Service: Gastroenterology;  Laterality: N/A;  . ICD GENERATOR CHANGEOUT N/A 03/04/2017   Procedure: ICD Generator Changeout;  Surgeon: Sydney Klein, MD;  Location: [place] CV LAB;  Service: Cardiovascular;  Laterality: N/A;  . INSERT / REPLACE / REMOVE PACEMAKER    . INTRAOPERATIVE ARTERIOGRAM Right 10/12/2013   Procedure: INTRA OPERATIVE ARTERIOGRAM;  Surgeon: Elam Dutch, MD;  Location: [place];  Service: Vascular;  Laterality: Right;  . ORIF ELBOW FRACTURE  08/16/2011   Procedure: OPEN REDUCTION INTERNAL FIXATION (ORIF) ELBOW/OLECRANON FRACTURE;  Surgeon: Schuyler Amor, MD;  Location: [place];  Service: Orthopedics;  Laterality: Left;  . RENAL ANGIOGRAM N/A 08/12/2013   Procedure:  RENAL ANGIOGRAM;  Surgeon: Elam Dutch, MD;  Location: [facility] CATH LAB;  Service: Cardiovascular;  Laterality: N/A;  . SUBCLAVIAN STENT PLACEMENT Left 07/31/2011   7x18 Genesis, balloon, with reduction of 90% ostial left subclavian artery stenosis to 0% with residual excellent flow  . TONSILLECTOMY    . TUBAL LIGATION      Current Medications: Outpatient Medications Prior to Visit  Medication Sig Dispense Refill  . ACCU-CHEK FASTCLIX LANCETS MISC USE   TO CHECK GLUCOSE ONCE DAILY 102 each 5  . ACCU-CHEK SMARTVIEW test strip USE  STRIP TO CHECK GLUCOSE ONCE DAILY 100 each  1  . acetaminophen (TYLENOL) 500 MG tablet Take 1 tablet (500 mg total) by mouth 2 (two) times daily. 30 tablet 0  . albuterol (PROVENTIL) (2.5 MG/3ML) 0.083% nebulizer solution USE 1 VIAL IN NEBULIZER EVERY 4 HOURS AS NEEDED FOR WHEEZING OR SHORTNESS OF BREATH 75 vial 0  . allopurinol (ZYLOPRIM) 300 MG tablet Take 1 tablet (300 mg total) by mouth daily. 30 tablet 0  . ALPRAZolam (XANAX) 1 MG tablet Take 1 tablet (1 mg total) by mouth 2 (two) times daily as needed for anxiety. Take 1 tablet in the morning, and 1.5 tablets at night 75 tablet 5  . aspirin 81 MG chewable tablet Chew 1 tablet (81 mg total) by mouth daily. 30 tablet 1  . azelastine (ASTELIN) 0.1 % nasal spray Place 2 sprays into both nostrils 2 (two) times daily. Use in each nostril as directed 30 mL 12  . bisoprolol (ZEBETA) 5 MG tablet Take 1 tablet (5 mg total) by mouth daily. 90 tablet 3  . cholecalciferol (VITAMIN D) 1000 units tablet Take 1,000 Units by mouth daily.    . clopidogrel (PLAVIX) 75 MG tablet Take 1 tablet (75 mg total) by mouth daily. 90 tablet 3  . cyanocobalamin 1000 MCG tablet Take 1,000 mcg by mouth daily.    . DULoxetine (CYMBALTA) 60 MG capsule TAKE 1 CAPSULE BY MOUTH TWICE DAILY 60 capsule 11  . furosemide (LASIX) 80 MG tablet TAKE 2 TABLETS BY MOUTH ONCE DAILY IN THE MORNING AND 1 ONCE DAILY IN THE EVENING 90 tablet 0  . Magnesium Oxide  400 MG CAPS Take 1 capsule (400 mg total) by mouth 2 (two) times daily. 180 capsule 3  . potassium chloride SA (K-DUR,KLOR-CON) 20 MEQ tablet Take 2 tablets (40 mEq total) by mouth 2 (two) times daily. 360 tablet 3  . PROAIR HFA 108 (90 Base) MCG/ACT inhaler Inhale 2 puffs into the lungs every 6 (six) hours as needed for wheezing or shortness of breath.     Marland Kitchen PROAIR HFA 108 (90 Base) MCG/ACT inhaler INHALE 2 PUFFS BY MOUTH EVERY 4 HOURS AS NEEDED FOR WHEEZING AND FOR SHORTNESS OF BREATH 3 Inhaler 3  . traZODone (DESYREL) 50 MG tablet Take 1 tablet (50 mg total) by mouth at bedtime. (Patient taking differently: Take 50 mg by mouth at bedtime as needed for sleep. ) 90 tablet 3  . nitroGLYCERIN (NITROSTAT) 0.4 MG SL tablet Place 0.4 mg under the tongue every 5 (five) minutes as needed for chest pain. X 3 doses     No facility-administered medications prior to visit.      Allergies:   Potassium-containing compounds; Neomycin-polymyxin b gu; Other; Azithromycin; Codeine; Darvon; Erythromycin; Meloxicam; Norco [hydrocodone-acetaminophen]; Penicillins; Propoxyphene n-acetaminophen; Rofecoxib; Rosuvastatin; Statins; and Sulfa antibiotics   Social History   Socioeconomic History  . Marital status: Widowed    Spouse name: Not on file  . Number of children: 3  . Years of education: 63  . Highest education level: Not on file  Occupational History  . Occupation: Retired  Scientific laboratory technician  . Financial resource strain: Not on file  . Food insecurity:    Worry: Not on file    Inability: Not on file  . Transportation needs:    Medical: Not on file    Non-medical: Not on file  Tobacco Use  . Smoking status: Former Smoker    Packs/day: 0.50    Years: 62.00    Pack years: 31.00    Types: Cigarettes  Last attempt to quit: 07/19/2017    Years since quitting: 1.0  . Smokeless tobacco: Never Used  Substance and Sexual Activity  . Alcohol use: No    Alcohol/week: 0.0 standard drinks  . Drug use: No  .  Sexual activity: Never    Birth control/protection: Surgical  Lifestyle  . Physical activity:    Days per week: Not on file    Minutes per session: Not on file  . Stress: Not on file  Relationships  . Social connections:    Talks on phone: Not on file    Gets together: Not on file    Attends religious service: Not on file    Active member of club or organization: Not on file    Attends meetings of clubs or organizations: Not on file    Relationship status: Not on file  Other Topics Concern  . Not on file  Social History Narrative   Ok to share information with medical POA, Son Gabriel Carina   Right-handed   Caffeine: Pepsi     Family History:  The patient's family history includes CAD in her father; Cancer in her sister; Colon cancer in her maternal grandmother; Deep vein thrombosis in her son; Diabetes in her sister; Heart disease in her father, mother, and sister; Hyperlipidemia in her father and another family member.   ROS:   Please see the history of present illness.    ROS all other systems are reviewed and are negative  PHYSICAL EXAM:   VS:  BP 118/62   Pulse 97   Ht 5' 5.5" (1.664 m)   Wt 189 lb 3.2 oz (85.8 kg)   SpO2 94%   BMI 31.01 kg/m       General: Alert, oriented x3, no distress, obese Head: no evidence of trauma, PERRL, EOMI, no exophtalmos or lid lag, no myxedema, no xanthelasma; normal ears, nose and oropharynx Neck: 6-8 cm jugular venous pulsations and no hepatojugular reflux; brisk carotid pulses without delay and no carotid bruits Chest: clear to auscultation, no signs of consolidation by percussion or palpation, normal fremitus, symmetrical and full respiratory excursions Cardiovascular: normal position and quality of the apical impulse, regular rhythm, normal first and second heart sounds, no murmurs, rubs or gallops Abdomen: no tenderness or distention, no masses by palpation, no abnormal pulsatility or arterial bruits, normal bowel sounds, no  hepatosplenomegaly Extremities: no clubbing, cyanosis or edema; 2+ radial, ulnar and brachial pulses bilaterally; 2+ right femoral, posterior tibial and dorsalis pedis pulses; 2+ left femoral, posterior tibial and dorsalis pedis pulses; no subclavian or femoral bruits Neurological: grossly nonfocal Psych: Normal mood and affect    Studies/Labs Reviewed:   EKG:  EKG is ordered today and shows atrial sensed (sinus), biventricular paced rhythm with a small R wave in lead V1.  The QRS is 146 ms.  QTc 558 ms.  Recent Labs: 02/01/2018: ALT 19; Hemoglobin 14.7; Platelets 267.0; TSH 2.79 04/23/2018: BUN 25; Creatinine, Ser 1.33; Magnesium 2.1; NT-Pro BNP 1,123; Potassium 4.3; Sodium 141   Lipid Panel    Component Value Date/Time   CHOL 183 07/02/2017 1120   TRIG 296.0 (H) 07/02/2017 1120   HDL 31.50 (L) 07/02/2017 1120   CHOLHDL 6 07/02/2017 1120   VLDL 59.2 (H) 07/02/2017 1120   LDLCALC 140 (H) 12/19/2016 0736   LDLDIRECT 115.0 07/02/2017 1120     ASSESSMENT:    1. Chronic combined systolic (congestive) and diastolic (congestive) heart failure ([place])   2. Biventricular ICD (implantable cardioverter-defibrillator) in  place   3. Coronary artery disease involving native coronary artery of native heart without angina pectoris   4. PAD (peripheral artery disease) ([place])   5. Hypercholesterolemia   6. Chronic obstructive pulmonary disease, unspecified COPD type ([place])      PLAN:  In order of problems listed above:  1. CHF: She continues to have rather broad swings in her intrathoracic volume status and at least on 2 occasions this has been related to big variations in her LV pacing threshold and loss of CRT, and not related to overall systemic signs of hypervolemia.. Most recent EF around 35-40 % on echo from February 2019.  She is taking a highly selective beta blocker.  She is not on RAA S inhibitors due to volatile renal function and the tendency to orthostatic hypotension and falls.   Clinically she appears to be almost euvolemic.  Our thoracic impedance is almost back to baseline.  I did not make any changes to her medications today. 2. CRT-D: Normal device function.  This likely happened in November 2019 she had a marked increase in pacing threshold on loss of CRT throughout most of February 2020.  We increased the pacing output on her LV lead today.  We tested other pacing vector configurations, but this was still the most favorable one.  She is undergoing monthly OptiVol downloads. Continue remote downloads every 3 months office visit in 6 months. 3. CAD s/p DES to RCA and LCX Dec 2017: Asymptomatic, angina free.  High risk anatomy, so she is on lifelong aspirin and clopidogrel.  She has not had any bleeding problems.  Remote history of GI bleeding, none recently.  4. PAD: She denies claudication in either the upper or lower extremities.  History of left subclavian stent, should monitor blood pressure always in the right arm. History of left carotid endarterectomy, followed by Dr. Oneida Alar. Asymptomatic decrease in left lower extremity ABI. 5. HLP: No longer on statin (muscle cramps).  Offered Repatha and she is not interested. 7. COPD: Quit smoking February 2019.   Medication Adjustments/Labs and Tests Ordered: Current medicines are reviewed at length with the patient today.  Concerns regarding medicines are outlined above.  Medication changes, Labs and Tests ordered today are listed in the Patient Instructions below. Patient Instructions  Medication Instructions:  Your physician recommends that you continue on your current medications as directed. Please refer to the Current Medication list given to you today.  If you need a refill on your cardiac medications before your next appointment, please call your pharmacy.   Follow-Up: At [facility], you and your health needs are our priority.  As part of our continuing mission to provide you with exceptional heart care, we have  created designated Provider Care Teams.  These Care Teams include your primary Cardiologist (physician) and Advanced Practice Providers (APPs -  Physician Assistants and Nurse Practitioners) who all work together to provide you with the care you need, when you need it. You will need a follow up appointment in 6 months.  Please call our office 2 months in advance to schedule this appointment.  You may see Sydney Klein, MD or one of the following Advanced Practice Providers on your designated Care Team: [place], [place] . Fabian Sharp, PA-C  Any Other Special Instructions Will Be Listed Below (If Applicable). None      Signed, Sydney Klein, MD  08/19/2018 1:59 PM    [facility] [place], [place], [place]  [address] Phone: [phone]; Fax: [phone]

## 2018-08-19 NOTE — Patient Instructions (Signed)
Medication Instructions:  Your physician recommends that you continue on your current medications as directed. Please refer to the Current Medication list given to you today.  If you need a refill on your cardiac medications before your next appointment, please call your pharmacy.    Follow-Up: At [facility], you and your health needs are our priority.  As part of our continuing mission to provide you with exceptional heart care, we have created designated Provider Care Teams.  These Care Teams include your primary Cardiologist (physician) and Advanced Practice Providers (APPs -  Physician Assistants and Nurse Practitioners) who all work together to provide you with the care you need, when you need it. You will need a follow up appointment in 6 months.  Please call our office 2 months in advance to schedule this appointment.  You may see Mihai Croitoru, MD or one of the following Advanced Practice Providers on your designated Care Team: Hao Meng, PA-C . Angela Duke, PA-C  Any Other Special Instructions Will Be Listed Below (If Applicable). None   

## 2018-08-24 ENCOUNTER — Other Ambulatory Visit: Payer: Self-pay | Admitting: Cardiovascular Disease

## 2018-08-24 ENCOUNTER — Other Ambulatory Visit: Payer: Self-pay

## 2018-08-24 NOTE — Patient Outreach (Signed)
[facility]) Care Management  08/24/2018  [name] 1936/04/21 [id number]   Medication Adherence call to Sydney Phillips spoke with patient she has discontinued taking Glipizide 5 mg because of side effects, doctor's office said they discontinued this medication on 08/07/18. Sydney Phillips is showing past due under [place].   [place] Management Direct Dial [phone]  Fax [phone] [email or web address]

## 2018-08-24 NOTE — Patient Outreach (Signed)
[facility]) Care Management  08/24/2018  ALIESE BRANNUM 1935-10-02 [id number]   Medication Adherence call to Mrs. Estelle Gullett left a message for patient to call back patient is showing past due on Metformin ER 500 mg and Januvia 100 mg. Mrs. Loel Dubonnet is showing past due under [place].   [place] Management Direct Dial [phone]  Fax [phone] [email or web address]

## 2018-08-30 ENCOUNTER — Ambulatory Visit (INDEPENDENT_AMBULATORY_CARE_PROVIDER_SITE_OTHER): Payer: Medicare Other

## 2018-08-30 ENCOUNTER — Other Ambulatory Visit: Payer: Self-pay

## 2018-08-30 DIAGNOSIS — I5042 Chronic combined systolic (congestive) and diastolic (congestive) heart failure: Secondary | ICD-10-CM

## 2018-08-30 DIAGNOSIS — Z95810 Presence of automatic (implantable) cardiac defibrillator: Secondary | ICD-10-CM

## 2018-08-31 NOTE — Progress Notes (Signed)
EPIC Encounter for ICM Monitoring  Patient Name: Sydney Phillips is a 83 y.o. female Date: 08/31/2018 Primary Care Physican: Laurey Morale, MD Primary Cardiologist:Croitoru Electrophysiologist: Croitoru BiV Pacing: 98.4% (Effective96.2%f) Last Weight:183lbs(ranges from 180-186 lbs) Today's Weight: 182 lbs    Heart failure questions and patient is asymptomatic.    Thoracic impedance normal since 08/19/2018.  Prescribed:Furosemide 80 mg2 tabletsevery morning and 1 tablet in the evening.Potassium 20 mEq 2tablets (40 mEq total)twice a day.  Labs: 04/23/2018 Creatinine 1.33, BUN 25, Potassium 4.3, Sodium 141, GFR 38-44 02/01/2018 Creatinine 1.22, BUN 17, Potassium 3.4, Sodium 140, GFR 45.07 07/30/2017 Creatinine1.55, BUN40, Potassium5.1, Sodium135 07/23/2017 Creatinine1.17, BUN34, Potassium3.8, [id number], GFR43-50  07/22/2017 Creatinine1.20, BUN35, Potassium4.4, [id number], FXO32-91  07/21/2017 Creatinine1.30, BUN31, Potassium4.5, Sodium136, BTY60-60  07/20/2017 Creatinine1.25, BUN26, Potassium5.4, [id number], SEL95-32  07/19/2017 Creatinine1.00, BUN22, Potassium4.2, Sodium140  07/18/2017 Creatinine1.14, BUN26, Potassium3.7, [id number], SHU83-72  07/02/2017 Creatinine1.09, BUN17, Potassium3.2, Sodium141 A complete set of results can be found in Results Review  Recommendations:No changes and encouraged to call for fluid symptoms.  Follow-up plan: ICM clinic phone appointment on4/16/2020  Copy of ICM check sent to Dr. Sallyanne Kuster       3 month ICM trend: 08/30/2018    1 Year ICM trend:       [name], RN 08/31/2018 7:56 AM

## 2018-09-01 NOTE — Progress Notes (Signed)
TY MCr

## 2018-09-04 IMAGING — CR DG CHEST 1V PORT
1 series · 1 of 1 positions shown · non-contrast
Comparison: 01/19/2014

CLINICAL DATA: COPD and difficulty breathing

EXAM:
PORTABLE CHEST 1 VIEW

[portable]
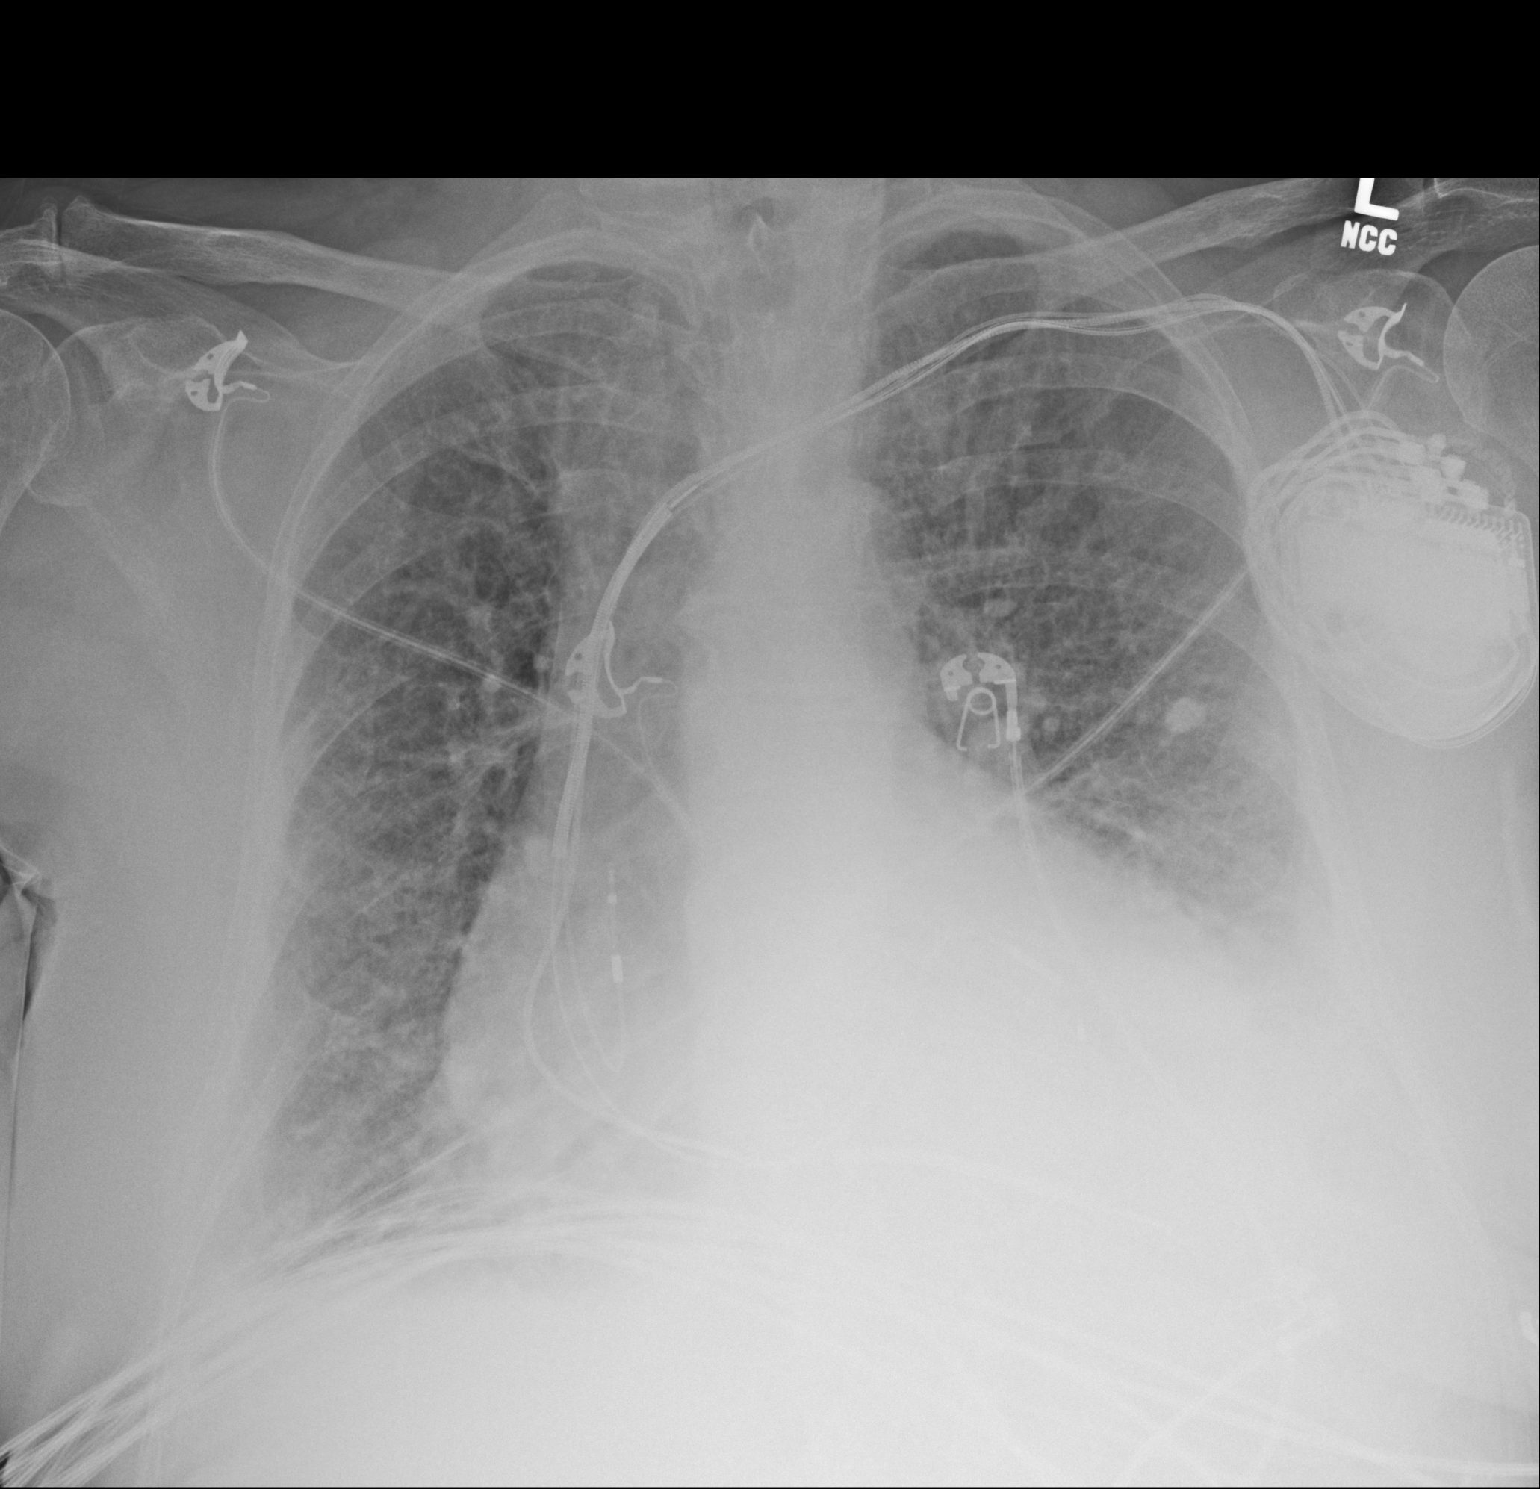

[1 of 1 positions shown; findings below may reference images not displayed]

FINDINGS: Cardiac shadow is enlarged. This is a significant change from the
prior exam. Calcified granuloma in the left mid lung. A
defibrillator is again seen and stable. Vascular congestion with
interstitial edema is seen.
IMPRESSION: Cardiomegaly with vascular congestion and interstitial edema.

The changes of prior granulomatous disease.

## 2018-09-07 IMAGING — DX DG CHEST 1V PORT
1 series · 1 of 1 positions shown · non-contrast
Comparison: Single-view of the chest 04/06/2016. CT chest
04/07/2016.

CLINICAL DATA: Following fusion is.  Shortness of breath.

EXAM:
PORTABLE CHEST 1 VIEW

[chest ap]
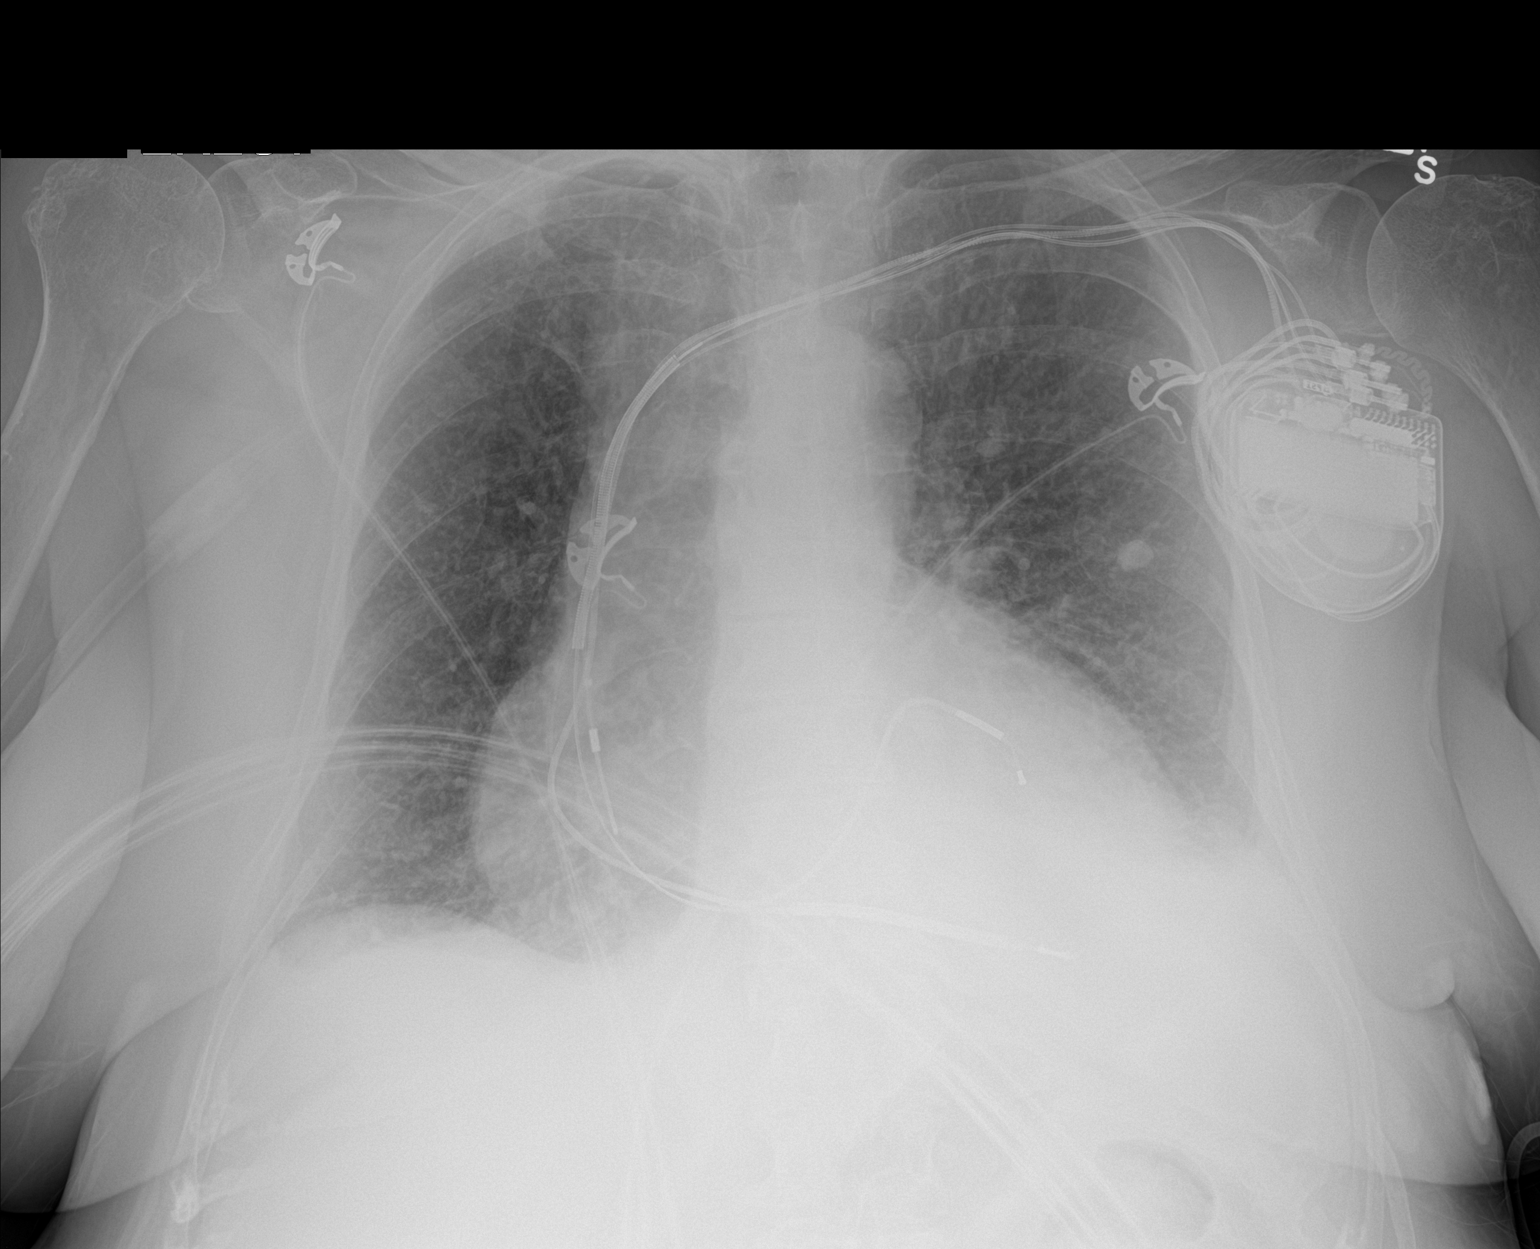

[1 of 1 positions shown; findings below may reference images not displayed]

FINDINGS: Pulmonary edema seen on the comparison plain film has improved.
Bilateral pleural effusions with basilar atelectasis, worse on the
left, are also improved. Marked cardiomegaly is again seen.
Calcified granuloma in the left lower lobe is noted.
IMPRESSION: Improved congestive failure with decreased edema, effusions and
atelectasis.

Marked cardiomegaly.

## 2018-09-10 DIAGNOSIS — J449 Chronic obstructive pulmonary disease, unspecified: Secondary | ICD-10-CM | POA: Diagnosis not present

## 2018-09-10 DIAGNOSIS — J439 Emphysema, unspecified: Secondary | ICD-10-CM | POA: Diagnosis not present

## 2018-09-10 DIAGNOSIS — M5127 Other intervertebral disc displacement, lumbosacral region: Secondary | ICD-10-CM | POA: Diagnosis not present

## 2018-09-11 ENCOUNTER — Other Ambulatory Visit: Payer: Self-pay | Admitting: Family Medicine

## 2018-09-30 ENCOUNTER — Other Ambulatory Visit: Payer: Self-pay

## 2018-10-01 ENCOUNTER — Telehealth: Payer: Self-pay

## 2018-10-01 NOTE — Telephone Encounter (Signed)
Left message for patient to remind of missed remote transmission.  

## 2018-10-02 ENCOUNTER — Other Ambulatory Visit: Payer: Self-pay | Admitting: Family Medicine

## 2018-10-11 ENCOUNTER — Ambulatory Visit: Payer: Self-pay | Admitting: Family Medicine

## 2018-10-11 ENCOUNTER — Other Ambulatory Visit: Payer: Self-pay

## 2018-10-11 ENCOUNTER — Encounter: Payer: Self-pay | Admitting: Family Medicine

## 2018-10-11 ENCOUNTER — Ambulatory Visit (INDEPENDENT_AMBULATORY_CARE_PROVIDER_SITE_OTHER): Payer: Medicare Other | Admitting: Family Medicine

## 2018-10-11 DIAGNOSIS — J439 Emphysema, unspecified: Secondary | ICD-10-CM

## 2018-10-11 DIAGNOSIS — I255 Ischemic cardiomyopathy: Secondary | ICD-10-CM | POA: Diagnosis not present

## 2018-10-11 DIAGNOSIS — I5042 Chronic combined systolic (congestive) and diastolic (congestive) heart failure: Secondary | ICD-10-CM | POA: Diagnosis not present

## 2018-10-11 DIAGNOSIS — J449 Chronic obstructive pulmonary disease, unspecified: Secondary | ICD-10-CM | POA: Diagnosis not present

## 2018-10-11 DIAGNOSIS — M5127 Other intervertebral disc displacement, lumbosacral region: Secondary | ICD-10-CM | POA: Diagnosis not present

## 2018-10-11 NOTE — Telephone Encounter (Signed)
Pt. Reports she has noticed increased shortness of breath x 2 weeks. Uses O2 at night at 4L/min. During the day does not use it. And gets short of breath waking to her kitchen or just doing a few dishes. On her oxygen O2 sats 92-93%. Off oxygen upper 80's.Dis not sleep well last night. Denies cough or chest pain. Warm transfer to [facility] in the practice for a virtual visit. Answer Assessment - Initial Assessment Questions 1. RESPIRATORY STATUS: "Describe your breathing?" (e.g., wheezing, shortness of breath, unable to speak, severe coughing)      Shortness of breath 2. ONSET: "When did this breathing problem begin?"       2 weeks ago 3. PATTERN "Does the difficult breathing come and go, or has it been constant since it started?"      Constant 4. SEVERITY: "How bad is your breathing?" (e.g., mild, moderate, severe)    - MILD: No SOB at rest, mild SOB with walking, speaks normally in sentences, can lay down, no retractions, pulse < 100.    - MODERATE: SOB at rest, SOB with minimal exertion and prefers to sit, cannot lie down flat, speaks in phrases, mild retractions, audible wheezing, pulse 100-120.    - SEVERE: Very SOB at rest, speaks in single words, struggling to breathe, sitting hunched forward, retractions, pulse > 120      Moderate - feels shot of breath night as well 5. RECURRENT SYMPTOM: "Have you had difficulty breathing before?" If so, ask: "When was the last time?" and "What happened that time?"      No 6. CARDIAC HISTORY: "Do you have any history of heart disease?" (e.g., heart attack, angina, bypass surgery, angioplasty)      Yes 7. LUNG HISTORY: "Do you have any history of lung disease?"  (e.g., pulmonary embolus, asthma, emphysema)     Yes 8. CAUSE: "What do you think is causing the breathing problem?"      Unsure 9. OTHER SYMPTOMS: "Do you have any other symptoms? (e.g., dizziness, runny nose, cough, chest pain, fever)     Feels weak, no cough, no chest pain 10. PREGNANCY: "Is  there any chance you are pregnant?" "When was your last menstrual period?"       no 11. TRAVEL: "Have you traveled out of the country in the last month?" (e.g., travel history, exposures)       No  Protocols used: BREATHING DIFFICULTY-A-AH

## 2018-10-11 NOTE — Progress Notes (Signed)
Subjective:    Patient ID: Sydney Phillips, female    DOB: December 20, 1935, 83 y.o.   MRN: [id number]  HPI Virtual Visit via Video Note  I connected with the patient on 10/11/18 at 11:30 AM EDT by a video enabled telemedicine application and verified that I am speaking with the correct person using two identifiers.  Location patient: home Location provider:work or home office Persons participating in the virtual visit: patient, provider  I discussed the limitations of evaluation and management by telemedicine and the availability of in person appointments. The patient expressed understanding and agreed to proceed.   HPI: Here to complain of 2 weeks of increasing SOB, wheezing, and weight gain. She coughs occasionally but nothing remarkable. No chest pain. No fever. She has been wearing her [place] oxygen at 4 liters almost continuously. Her weight has gone up 6 lbs in this time, from 189 to 195. She has been urinating only twice a day lately, instead of her usual 6-7 times daily. She has been taking her Lasix as directed, by taking 80 mg 2 tabs in the morning and 1 tab in the evening.    ROS: See pertinent positives and negatives per HPI.  Past Medical History:  Diagnosis Date  . Anxiety   . Arthritis   . Asthma   . Automatic implantable cardioverter-defibrillator in situ   . Bronchitis   . CAD (coronary artery disease)    a. s/p DES to LCx/RCA 05/2016, ostial LAD disease.  . Cardiomyopathy ([place])   . Chronic back pain   . Chronic combined systolic and diastolic CHF (congestive heart failure) ([place])   . Chronic constipation   . Chronic pain   . CKD (chronic kidney disease), stage III ([place])   . Colon polyps 2003.  2015.   HP polyps 2003.  adnomas 2015.  required referal to [facility] for colonoscopic resection of flat polyps.   Marland Kitchen COPD (chronic obstructive pulmonary disease) ([place])   . Dementia ([place])   . Depression   . Depression with anxiety    takes Cymbalta daily  . Diabetes mellitus  ([place])   . Dyspnea   . Early cataracts, bilateral   . Fibromyalgia   . GERD (gastroesophageal reflux disease)    was on meds but was taken off;now watches what she eats  . Hemorrhoids   . History of kidney stones   . Hx of colonic polyps   . Hyperlipemia    takes Crestor daily  . Hypertension    takes Amlodipine and Metoprolol daily  . Insomnia   . LBBB (left bundle branch block)    Stress test 09/03/2010, EF 55  . Myocardial infarction ([place])   . PAD (peripheral artery disease) ([facility])    Carotid, subclavian, and lower extremity beds, currently not symptomatic  . Presence of combination internal cardiac defibrillator (ICD) and pacemaker   . Presence of permanent cardiac pacemaker   . Pulmonary hypertension ([place])   . S/P angioplasty with stent, lt. subclavian 07/31/11 08/01/2011  . Subclavian arterial stenosis, lt, with PTA/STENT 07/31/11 08/01/2011  . Syncope 07/28/2011   EF - 50-55, moderate concentric hypertrophy in left ventricle  . Urinary incontinence   . Vertigo    takes Meclizine prn    Past Surgical History:  Procedure Laterality Date  . ABDOMINAL AORTAGRAM N/A 08/12/2013   Procedure: ABDOMINAL Maxcine Ham;  Surgeon: Elam Dutch, MD;  Location: [facility] CATH LAB;  Service: Cardiovascular;  Laterality: N/A;  . ABDOMINAL HYSTERECTOMY    . APPENDECTOMY    .  BACK SURGERY  2012  . BIV ICD GENERTAOR CHANGE OUT Left 02/20/2012   Procedure: BIV ICD GENERTAOR CHANGE OUT;  Surgeon: [name], MD;  Location: [facility] CATH LAB;  Service: Cardiovascular;  Laterality: Left;  . CARDIAC CATHETERIZATION  12/01/2007   By Dr. Melvern Banker, left heart cath,   . CARDIAC CATHETERIZATION N/A 05/20/2016   Procedure: Right/Left Heart Cath and Coronary Angiography;  Surgeon: Sherren Mocha, MD;  Location: [place] CV LAB;  Service: Cardiovascular;  Laterality: N/A;  . CARDIAC CATHETERIZATION N/A 05/20/2016   Procedure: Coronary Stent Intervention;  Surgeon: Sherren Mocha, MD;  Location: [place] CV LAB;   Service: Cardiovascular;  Laterality: N/A;  . CARDIAC DEFIBRILLATOR PLACEMENT  05/2008   By Dr Blanch Media, [employer] CANNOT HAVE MRI's  . CAROTID ANGIOGRAM N/A 07/31/2011   Procedure: CAROTID ANGIOGRAM;  Surgeon: Lorretta Harp, MD;  Location: [facility] CATH LAB;  Service: Cardiovascular;  Laterality: N/A;  carotid angiogram and possible Lt SCA PTA  . COLONOSCOPY W/ POLYPECTOMY  12/2013  . CORONARY ANGIOPLASTY    . ENDARTERECTOMY Left 11/08/2014   Procedure: LEFT CAROTID ENDARTERECTOMY WITH HEMASHIELD PATCH ANGIOPLASTY;  Surgeon: Elam Dutch, MD;  Location: [place];  Service: Vascular;  Laterality: Left;  . ESOPHAGOGASTRODUODENOSCOPY N/A 01/20/2014   Procedure: ESOPHAGOGASTRODUODENOSCOPY (EGD);  Surgeon: Jerene Bears, MD;  Location: [facility] ENDOSCOPY;  Service: Endoscopy;  Laterality: N/A;  . FEMORAL-POPLITEAL BYPASS GRAFT Right 10/12/2013   Procedure:   Femoral-Peroneal trunk  bypass with nonreversed greater saphenous vein graft;  Surgeon: Elam Dutch, MD;  Location: [facility];  Service: Vascular;  Laterality: Right;  . GIVENS CAPSULE STUDY N/A 01/20/2014   Procedure: GIVENS CAPSULE STUDY;  Surgeon: Jerene Bears, MD;  Location: [place];  Service: Gastroenterology;  Laterality: N/A;  . ICD GENERATOR CHANGEOUT N/A 03/04/2017   Procedure: ICD Generator Changeout;  Surgeon: [name], MD;  Location: [place] CV LAB;  Service: Cardiovascular;  Laterality: N/A;  . INSERT / REPLACE / REMOVE PACEMAKER    . INTRAOPERATIVE ARTERIOGRAM Right 10/12/2013   Procedure: INTRA OPERATIVE ARTERIOGRAM;  Surgeon: Elam Dutch, MD;  Location: [place];  Service: Vascular;  Laterality: Right;  . ORIF ELBOW FRACTURE  08/16/2011   Procedure: OPEN REDUCTION INTERNAL FIXATION (ORIF) ELBOW/OLECRANON FRACTURE;  Surgeon: Schuyler Amor, MD;  Location: [facility];  Service: Orthopedics;  Laterality: Left;  . RENAL ANGIOGRAM N/A 08/12/2013   Procedure: RENAL ANGIOGRAM;  Surgeon: Elam Dutch, MD;  Location: [facility] CATH LAB;   Service: Cardiovascular;  Laterality: N/A;  . SUBCLAVIAN STENT PLACEMENT Left 07/31/2011   7x18 [place], balloon, with reduction of 90% ostial left subclavian artery stenosis to 0% with residual excellent flow  . TONSILLECTOMY    . TUBAL LIGATION      Family History  Problem Relation Age of Onset  . CAD Father   . Heart disease Father   . Hyperlipidemia Father   . Heart disease Mother   . Deep vein thrombosis Son   . Hyperlipidemia Other   . Colon cancer Maternal Grandmother   . Cancer Sister        ovarian  . Diabetes Sister   . Heart disease Sister   . Anesthesia problems Neg Hx   . Hypotension Neg Hx   . Malignant hyperthermia Neg Hx   . Pseudochol deficiency Neg Hx      Current Outpatient Medications:  .  ACCU-CHEK FASTCLIX LANCETS MISC, USE   TO CHECK GLUCOSE ONCE DAILY, Disp: 102 each, Rfl: 5 .  ACCU-CHEK SMARTVIEW test strip, USE  STRIP TO CHECK GLUCOSE ONCE DAILY, Disp: 100 each, Rfl: 1 .  acetaminophen (TYLENOL) 500 MG tablet, Take 1 tablet (500 mg total) by mouth 2 (two) times daily., Disp: 30 tablet, Rfl: 0 .  albuterol (PROVENTIL) (2.5 MG/3ML) 0.083% nebulizer solution, USE 1 VIAL IN NEBULIZER EVERY 4 HOURS AS NEEDED FOR WHEEZING OR SHORTNESS OF BREATH, Disp: 75 vial, Rfl: 0 .  allopurinol (ZYLOPRIM) 300 MG tablet, Take 1 tablet (300 mg total) by mouth daily., Disp: 30 tablet, Rfl: 0 .  ALPRAZolam (XANAX) 1 MG tablet, Take 1 tablet (1 mg total) by mouth 2 (two) times daily as needed for anxiety. Take 1 tablet in the morning, and 1.5 tablets at night, Disp: 75 tablet, Rfl: 5 .  aspirin 81 MG chewable tablet, Chew 1 tablet (81 mg total) by mouth daily., Disp: 30 tablet, Rfl: 1 .  azelastine (ASTELIN) 0.1 % nasal spray, USE 2 SPRAY(S) IN EACH NOSTRIL TWICE DAILY **USE  IN  EACH  NOSTRIL  AS  DIRECTED, Disp: 30 mL, Rfl: 0 .  bisoprolol (ZEBETA) 5 MG tablet, Take 1 tablet (5 mg total) by mouth daily., Disp: 90 tablet, Rfl: 3 .  cholecalciferol (VITAMIN D) 1000 units tablet,  Take 1,000 Units by mouth daily., Disp: , Rfl:  .  clopidogrel (PLAVIX) 75 MG tablet, Take 1 tablet (75 mg total) by mouth daily., Disp: 90 tablet, Rfl: 3 .  cyanocobalamin 1000 MCG tablet, Take 1,000 mcg by mouth daily., Disp: , Rfl:  .  DULoxetine (CYMBALTA) 60 MG capsule, TAKE 1 CAPSULE BY MOUTH TWICE DAILY, Disp: 60 capsule, Rfl: 11 .  furosemide (LASIX) 80 MG tablet, TAKE 2 TABLETS BY MOUTH IN THE MORNING AND 1 IN THE EVENING, Disp: 90 tablet, Rfl: 0 .  Magnesium Oxide 400 MG CAPS, Take 1 capsule (400 mg total) by mouth 2 (two) times daily., Disp: 180 capsule, Rfl: 3 .  nitroGLYCERIN (NITROSTAT) 0.4 MG SL tablet, Place 0.4 mg under the tongue every 5 (five) minutes as needed for chest pain. X 3 doses, Disp: , Rfl:  .  potassium chloride SA (K-DUR,KLOR-CON) 20 MEQ tablet, Take 2 tablets (40 mEq total) by mouth 2 (two) times daily., Disp: 360 tablet, Rfl: 3 .  PROAIR HFA 108 (90 Base) MCG/ACT inhaler, Inhale 2 puffs into the lungs every 6 (six) hours as needed for wheezing or shortness of breath. , Disp: , Rfl:  .  PROAIR HFA 108 (90 Base) MCG/ACT inhaler, INHALE 2 PUFFS BY MOUTH EVERY 4 HOURS AS NEEDED FOR WHEEZING AND FOR SHORTNESS OF BREATH, Disp: 3 Inhaler, Rfl: 3 .  traZODone (DESYREL) 50 MG tablet, Take 1 tablet (50 mg total) by mouth at bedtime. (Patient taking differently: Take 50 mg by mouth at bedtime as needed for sleep. ), Disp: 90 tablet, Rfl: 3  EXAM:  VITALS per patient if applicable:  GENERAL: alert, oriented, appears well and in no acute distress  HEENT: atraumatic, conjunttiva clear, no obvious abnormalities on inspection of external nose and ears  NECK: normal movements of the head and neck  LUNGS: on inspection no signs of respiratory distress, breathing rate appears normal, no obvious gross SOB, gasping or wheezing  CV: no obvious cyanosis  MS: moves all visible extremities without noticeable abnormality  PSYCH/NEURO: pleasant and cooperative, no obvious depression  or anxiety, speech and thought processing grossly intact  ASSESSMENT AND PLAN: This is likely another exacerbation of her CHF. She will wear the oxygen for now.  She  will increase the Lasix to 2 tabs BID for a few days. We plan to have another virtual visit on Friday of this week to check her progress.  Alysia Penna, MD  Discussed the following assessment and plan:  Pulmonary emphysema, unspecified emphysema type ([place])  Cardiomyopathy, ischemic  Chronic combined systolic and diastolic heart failure ([place])     I discussed the assessment and treatment plan with the patient. The patient was provided an opportunity to ask questions and all were answered. The patient agreed with the plan and demonstrated an understanding of the instructions.   The patient was advised to call back or seek an in-person evaluation if the symptoms worsen or if the condition fails to improve as anticipated.     Review of Systems     Objective:   Physical Exam        Assessment & Plan:

## 2018-10-12 ENCOUNTER — Other Ambulatory Visit: Payer: Self-pay | Admitting: Family Medicine

## 2018-10-19 ENCOUNTER — Other Ambulatory Visit: Payer: Self-pay | Admitting: Family Medicine

## 2018-10-19 ENCOUNTER — Other Ambulatory Visit: Payer: Self-pay | Admitting: *Deleted

## 2018-10-25 ENCOUNTER — Telehealth: Payer: Self-pay | Admitting: Cardiovascular Disease

## 2018-10-25 NOTE — Telephone Encounter (Signed)
lmtcb - 6 month recall.  Please schedule.  Try to put on Dr C schedule this week.

## 2018-10-28 NOTE — Telephone Encounter (Signed)
Patient wants a afternoon appt. She will not be awake at 10am. Scheduler was informed.

## 2018-10-29 ENCOUNTER — Other Ambulatory Visit: Payer: Self-pay

## 2018-10-29 ENCOUNTER — Telehealth: Payer: Medicare Other | Admitting: Cardiovascular Disease

## 2018-11-01 ENCOUNTER — Other Ambulatory Visit: Payer: Self-pay

## 2018-11-01 ENCOUNTER — Ambulatory Visit (INDEPENDENT_AMBULATORY_CARE_PROVIDER_SITE_OTHER): Payer: Medicare Other

## 2018-11-01 ENCOUNTER — Telehealth: Payer: Self-pay

## 2018-11-01 DIAGNOSIS — Z95810 Presence of automatic (implantable) cardiac defibrillator: Secondary | ICD-10-CM | POA: Diagnosis not present

## 2018-11-01 DIAGNOSIS — I5042 Chronic combined systolic (congestive) and diastolic (congestive) heart failure: Secondary | ICD-10-CM | POA: Diagnosis not present

## 2018-11-01 NOTE — Progress Notes (Signed)
She could not do her telemedicine visit because 10 AM was too early. We moved her to 12 and she still couldn't make it on time.Marland KitchenMarland Kitchen

## 2018-11-01 NOTE — Progress Notes (Signed)
EPIC Encounter for ICM Monitoring  Patient Name: Sydney Phillips is a 83 y.o. female Date: 11/01/2018 Primary Care Physican: Laurey Morale, MD Primary Cardiologist:Croitoru Electrophysiologist: Croitoru BiV Pacing: 93.4% (Effective90.4%) Last Weight: 182 lbs   Attempted call to patient and unable to reach.  Left message to return call. Transmission reviewed.   Thoracic impedanceabnormalsuggesting fluid accumulation since 08/30/2018.  Prescribed:Furosemide 80 mg2 tabletsevery morning and 1 tablet in the evening.Potassium 20 mEq 2tablets (40 mEq total)twice a day.  Labs: 04/23/2018 Creatinine 1.33, BUN 25, Potassium 4.3, Sodium 141, GFR 38-44 02/01/2018 Creatinine 1.22, BUN 17, Potassium 3.4, Sodium 140, GFR 45.07 07/30/2017 Creatinine1.55, BUN40, Potassium5.1, Sodium135 07/23/2017 Creatinine1.17, BUN34, Potassium3.8, [id number], GFR43-50  07/22/2017 Creatinine1.20, BUN35, Potassium4.4, URKYHC623, JSE83-15  07/21/2017 Creatinine1.30, BUN31, Potassium4.5, Sodium136, VVO16-07  07/20/2017 Creatinine1.25, BUN26, Potassium5.4, PXTGGY694, WNI62-70  07/19/2017 Creatinine1.00, BUN22, Potassium4.2, Sodium140  07/18/2017 Creatinine1.14, BUN26, Potassium3.7, [id number], EXH37-16  07/02/2017 Creatinine1.09, BUN17, Potassium3.2, Sodium141 A complete set of results can be found in Results Review  Recommendations: Unable to reach.  Follow-up plan: ICM clinic phone appointment on5/26/2020 to recheck fluid levels.  Copy of ICM check sent to Dr.Croitoru.   3 month ICM trend: 11/01/2018    1 Year ICM trend:       [name], RN 11/01/2018 2:02 PM

## 2018-11-01 NOTE — Telephone Encounter (Signed)
Remote ICM transmission received.  Attempted call to patient regarding ICM remote transmission and left message, per DPR, to return call.    

## 2018-11-02 ENCOUNTER — Other Ambulatory Visit: Payer: Self-pay | Admitting: Family Medicine

## 2018-11-09 ENCOUNTER — Other Ambulatory Visit: Payer: Self-pay | Admitting: Family Medicine

## 2018-11-09 ENCOUNTER — Ambulatory Visit (INDEPENDENT_AMBULATORY_CARE_PROVIDER_SITE_OTHER): Payer: Medicare Other | Admitting: *Deleted

## 2018-11-09 ENCOUNTER — Other Ambulatory Visit: Payer: Self-pay

## 2018-11-09 ENCOUNTER — Ambulatory Visit (INDEPENDENT_AMBULATORY_CARE_PROVIDER_SITE_OTHER): Payer: Medicare Other

## 2018-11-09 DIAGNOSIS — I5042 Chronic combined systolic (congestive) and diastolic (congestive) heart failure: Secondary | ICD-10-CM

## 2018-11-09 DIAGNOSIS — Z95810 Presence of automatic (implantable) cardiac defibrillator: Secondary | ICD-10-CM

## 2018-11-09 DIAGNOSIS — I255 Ischemic cardiomyopathy: Secondary | ICD-10-CM

## 2018-11-09 LAB — CUP PACEART REMOTE DEVICE CHECK
Battery Remaining Longevity: 36 mo
Battery Voltage: 2.95 V
Brady Statistic AP VP Percent: 0.71 %
Brady Statistic AP VS Percent: 0.03 %
Brady Statistic AS VP Percent: 98.8 %
Brady Statistic AS VS Percent: 0.47 %
Brady Statistic RA Percent Paced: 0.73 %
Brady Statistic RV Percent Paced: 98.79 %
Date Time Interrogation Session: 20200526062824
HighPow Impedance: 49 Ohm
HighPow Impedance: 58 Ohm
Implantable Lead Implant Date: 20091202
Implantable Lead Implant Date: 20091202
Implantable Lead Implant Date: 20091202
Implantable Lead Location: 753858
Implantable Lead Location: 753859
Implantable Lead Location: 753860
Implantable Lead Model: 185
Implantable Lead Model: 4194
Implantable Lead Model: 4470
Implantable Lead Serial Number: 308680
Implantable Lead Serial Number: 635533
Implantable Pulse Generator Implant Date: 20180919
Lead Channel Impedance Value: 304 Ohm
Lead Channel Impedance Value: 418 Ohm
Lead Channel Impedance Value: 418 Ohm
Lead Channel Impedance Value: 551 Ohm
Lead Channel Impedance Value: 646 Ohm
Lead Channel Impedance Value: 646 Ohm
Lead Channel Pacing Threshold Amplitude: 0.625 V
Lead Channel Pacing Threshold Amplitude: 0.625 V
Lead Channel Pacing Threshold Amplitude: 2.25 V
Lead Channel Pacing Threshold Pulse Width: 0.4 ms
Lead Channel Pacing Threshold Pulse Width: 0.4 ms
Lead Channel Pacing Threshold Pulse Width: 1 ms
Lead Channel Sensing Intrinsic Amplitude: 2.5 mV
Lead Channel Sensing Intrinsic Amplitude: 2.5 mV
Lead Channel Sensing Intrinsic Amplitude: 24.25 mV
Lead Channel Sensing Intrinsic Amplitude: 24.25 mV
Lead Channel Setting Pacing Amplitude: 1.5 V
Lead Channel Setting Pacing Amplitude: 2 V
Lead Channel Setting Pacing Amplitude: 3.5 V
Lead Channel Setting Pacing Pulse Width: 0.4 ms
Lead Channel Setting Pacing Pulse Width: 1 ms
Lead Channel Setting Sensing Sensitivity: 0.3 mV

## 2018-11-09 NOTE — Telephone Encounter (Signed)
Pt called to f/u on RX requests for allopurinol and alprazolam stating she is out of both and the pharmacy was supposed to request them last week and told her they did and did not get a response from the doctor.   [place], [place]

## 2018-11-09 NOTE — Telephone Encounter (Signed)
Pt called to f/u on RX requests for furosemide and azelastine. Pt notes she is out of both. Pt said that Dr. Sarajane Jews told her to take furosemide 2 morning and increase to 2 in the evening so she ran out.   [place], [place]

## 2018-11-09 NOTE — Progress Notes (Signed)
EPIC Encounter for ICM Monitoring  Patient Name: Sydney Phillips is a 83 y.o. female Date: 11/09/2018 Primary Care Physican: Laurey Morale, MD Primary Cardiologist:Croitoru Electrophysiologist: Croitoru BiV Pacing: 93.4% (Effective90.4%) Last Weight:182 lbs 11/08/2018 188 lbs (187-190)   Spoke with patient and she denies any fluid symptoms.  She said she is waiting on Dr Sarajane Jews to refill Furosemide and it was increased to 2 tablets twice a day.   She says she follows a low sodium diet and reads food labels for salt content.    Optivol Thoracic impedanceabnormalsuggesting fluid accumulation since3/16/2020.  Prescribed:Furosemide 80 mg2 tablets(160 mg total) twice a day (increased by Dr Sarajane Jews per patient).Potassium 20 mEq 2tablets (40 mEq total)twice a day.  Labs: 04/23/2018 Creatinine 1.33, BUN 25, Potassium 4.3, Sodium 141, GFR 38-44 02/01/2018 Creatinine 1.22, BUN 17, Potassium 3.4, Sodium 140, GFR 45.07 07/30/2017 Creatinine1.55, BUN40, Potassium5.1, Sodium135 A complete set of results can be found in Results Review  Recommendations: Advised to decrease salt intake and call if she experiences any fluid symptoms  Follow-up plan: ICM clinic phone appointment on6/01/2019 to recheck fluid levels.  Copy of ICM check sent to Dr.Croitoru.   3 month ICM trend: 11/09/2018    1 Year ICM trend:       [name], RN 11/09/2018 1:19 PM

## 2018-11-09 NOTE — Telephone Encounter (Signed)
Call in Xanax #75 with 5 rf, also Allopurinol #90 with 3 rf

## 2018-11-09 NOTE — Telephone Encounter (Signed)
Copied from [address] [phone]. Topic: Quick Communication - Rx Refill/Question >> Nov 09, 2018  2:23 PM Mathis Bud wrote: Medication: furosemide (LASIX) 80 MG   Has the patient contacted their pharmacy? Yes no more refills. And patient is out for two days.   Preferred Pharmacy (with phone number or street name): [place], [place] - [place] [phone] (Phone) [phone] (Fax)    Agent: Please be advised that RX refills may take up to 3 business days. We ask that you follow-up with your pharmacy.

## 2018-11-10 DIAGNOSIS — J449 Chronic obstructive pulmonary disease, unspecified: Secondary | ICD-10-CM | POA: Diagnosis not present

## 2018-11-10 DIAGNOSIS — J439 Emphysema, unspecified: Secondary | ICD-10-CM | POA: Diagnosis not present

## 2018-11-10 DIAGNOSIS — M5127 Other intervertebral disc displacement, lumbosacral region: Secondary | ICD-10-CM | POA: Diagnosis not present

## 2018-11-10 MED ORDER — FUROSEMIDE 80 MG PO TABS: ORAL_TABLET | ORAL | 0 refills | Status: DC

## 2018-11-10 NOTE — Telephone Encounter (Signed)
Pt stated that her other provider told her that fluid was building around her heart and she has not taken her lasix in 4 days and needs a refill asap / please call Pt when sent

## 2018-11-10 NOTE — Telephone Encounter (Signed)
Rx has been sent in. 

## 2018-11-11 ENCOUNTER — Telehealth: Payer: Self-pay | Admitting: Family Medicine

## 2018-11-11 ENCOUNTER — Other Ambulatory Visit: Payer: Self-pay | Admitting: Family Medicine

## 2018-11-11 NOTE — Telephone Encounter (Signed)
Dr. Fry please advise on refills. Thanks  

## 2018-11-11 NOTE — Telephone Encounter (Signed)
Copied from [place] [id number]. Topic: Quick Communication - Rx Refill/Question >> Nov 11, 2018  4:07 PM Mathis Bud wrote: Medication: ALPRAZolam Duanne Moron) 1 MG tablet  allopurinol (ZYLOPRIM) 300 MG  potassium chloride SA (K-DUR,KLOR-CON) 20 MEQ tablet   Has the patient contacted their pharmacy? Yes, patient has no more refills  Preferred Pharmacy (with phone number or street name): [place], [place] - [place] [phone] (Phone) [phone] (Fax)    Agent: Please be advised that RX refills may take up to 3 business days. We ask that you follow-up with your pharmacy.

## 2018-11-12 MED ORDER — POTASSIUM CHLORIDE CRYS ER 20 MEQ PO TBCR: 40.0000 meq | EXTENDED_RELEASE_TABLET | Freq: Two times a day (BID) | ORAL | 3 refills | Status: DC

## 2018-11-12 MED ORDER — ALLOPURINOL 300 MG PO TABS: 300.0000 mg | ORAL_TABLET | Freq: Every day | ORAL | 3 refills | Status: DC

## 2018-11-12 MED ORDER — ALPRAZOLAM 1 MG PO TABS: 1.0000 mg | ORAL_TABLET | Freq: Two times a day (BID) | ORAL | 5 refills | Status: DC | PRN

## 2018-11-12 NOTE — Telephone Encounter (Signed)
Refill called to the pharmacy

## 2018-11-12 NOTE — Telephone Encounter (Signed)
Refills sent to the pharmacy.

## 2018-11-12 NOTE — Telephone Encounter (Signed)
All 3 were sent in

## 2018-11-17 NOTE — Progress Notes (Signed)
Remote ICD transmission.   

## 2018-11-22 ENCOUNTER — Other Ambulatory Visit: Payer: Self-pay

## 2018-11-22 ENCOUNTER — Ambulatory Visit (INDEPENDENT_AMBULATORY_CARE_PROVIDER_SITE_OTHER): Payer: Medicare Other

## 2018-11-22 DIAGNOSIS — I5042 Chronic combined systolic (congestive) and diastolic (congestive) heart failure: Secondary | ICD-10-CM

## 2018-11-22 DIAGNOSIS — Z95810 Presence of automatic (implantable) cardiac defibrillator: Secondary | ICD-10-CM

## 2018-11-23 NOTE — Progress Notes (Signed)
EPIC Encounter for ICM Monitoring  Patient Name: Sydney Phillips is a 83 y.o. female Date: 11/23/2018 Primary Care Physican: Laurey Morale, MD Primary Cardiologist:Croitoru Electrophysiologist: Croitoru BiV Pacing: 98.4% (Effective97%) LastWeight:182 lbs 11/08/2018 Weight: 188 lbs (187-190)    Transmission reviewed.  Optivol Thoracic impedancehas returned close to baseline normal.    Prescribed:Furosemide 80 mg2 tablets(160 mg total) by mouth every AM and 1 tablet (80 mg total) every PM. (prescribed by Dr Shade Flood 20 mEq 2tablets (40 mEq total)twice a day.  Labs: 04/23/2018 Creatinine 1.33, BUN 25, Potassium 4.3, Sodium 141, GFR 38-44 02/01/2018 Creatinine 1.22, BUN 17, Potassium 3.4, Sodium 140, GFR 45.07 07/30/2017 Creatinine1.55, BUN40, Potassium5.1, Sodium135 A complete set of results can be found in Results Review  Recommendations: No changes  Follow-up plan: ICM clinic phone appointment on7/11/2018.  Copy of ICM check sent to Dr.Croitoru.   3 month ICM trend: 11/22/2018    1 Year ICM trend:       [name], RN 11/23/2018 10:34 AM

## 2018-11-24 NOTE — Progress Notes (Signed)
She just does things her way

## 2018-12-07 ENCOUNTER — Other Ambulatory Visit: Payer: Self-pay | Admitting: Family Medicine

## 2018-12-08 ENCOUNTER — Other Ambulatory Visit: Payer: Self-pay | Admitting: Family Medicine

## 2018-12-08 NOTE — Telephone Encounter (Signed)
Copied from [place] [id number]. Topic: Quick Communication - Rx Refill/Question >> Dec 08, 2018  4:39 PM Lionel December wrote: Medication:    furosemide (LASIX) 80 MG tablet   Has the patient contacted their pharmacy? Yes.   (Agent: If no, request that the patient contact the pharmacy for the refill.) (Agent: If yes, when and what did the pharmacy advise?)  Preferred Pharmacy (with phone number or street name): [facility], [place] - [place] [phone] (Phone) [phone] (Fax)    Agent: Please be advised that RX refills may take up to 3 business days. We ask that you follow-up with your pharmacy.

## 2018-12-09 NOTE — Telephone Encounter (Signed)
Refill sent to the pharmacy 

## 2018-12-11 DIAGNOSIS — J439 Emphysema, unspecified: Secondary | ICD-10-CM | POA: Diagnosis not present

## 2018-12-11 DIAGNOSIS — J449 Chronic obstructive pulmonary disease, unspecified: Secondary | ICD-10-CM | POA: Diagnosis not present

## 2018-12-11 DIAGNOSIS — M5127 Other intervertebral disc displacement, lumbosacral region: Secondary | ICD-10-CM | POA: Diagnosis not present

## 2018-12-20 ENCOUNTER — Ambulatory Visit (INDEPENDENT_AMBULATORY_CARE_PROVIDER_SITE_OTHER): Payer: Medicare Other

## 2018-12-20 DIAGNOSIS — Z95810 Presence of automatic (implantable) cardiac defibrillator: Secondary | ICD-10-CM

## 2018-12-20 DIAGNOSIS — I5042 Chronic combined systolic (congestive) and diastolic (congestive) heart failure: Secondary | ICD-10-CM

## 2018-12-21 ENCOUNTER — Telehealth: Payer: Self-pay

## 2018-12-21 NOTE — Progress Notes (Signed)
EPIC Encounter for ICM Monitoring  Patient Name: Sydney Phillips is a 83 y.o. female Date: 12/21/2018 Primary Care Physican: Laurey Morale, MD Primary Cardiologist:Croitoru Electrophysiologist: Croitoru BiV Pacing: 98.7% (Effective88.1%) LastWeight:182 lbs 11/08/2018 Weight: 188 lbs (187-190)       Attempted call to patient and unable to reach.  Left detailed message per DPR regarding transmission. Transmission reviewed.   OptivolThoracic impedancenormal but had days since 6/10 that suggest possible dryness.    Prescribed:Furosemide 80 mg2 tablets(160 mg total) by mouth every AM and 1 tablet (80 mg total) every PM. (prescribed by Dr Shade Flood 20 mEq 2tablets (40 mEq total)twice a day.  Labs: 04/23/2018 Creatinine 1.33, BUN 25, Potassium 4.3, Sodium 141, GFR 38-44 02/01/2018 Creatinine 1.22, BUN 17, Potassium 3.4, Sodium 140, GFR 45.07 07/30/2017 Creatinine1.55, BUN40, Potassium5.1, Sodium135 A complete set of results can be found in Results Review  Recommendations: Left voice mail with ICM number and encouraged to call if experiencing any fluid symptoms.  Follow-up plan: ICM clinic phone appointment on8/03/2019.  Copy of ICM check sent to Dr.Croitoru.  3 month ICM trend: 12/20/2018    1 Year ICM trend:       [name], RN 12/21/2018 1:06 PM

## 2018-12-21 NOTE — Telephone Encounter (Signed)
Remote ICM transmission received.  Attempted call to patient regarding ICM remote transmission and left detailed message, per DPR, with next ICM remote transmission date of 01/24/2019.  Advised to return call for any fluid symptoms or questions.

## 2018-12-21 NOTE — Progress Notes (Signed)
All over the map again! Thanks

## 2018-12-24 ENCOUNTER — Other Ambulatory Visit: Payer: Self-pay | Admitting: Family Medicine

## 2018-12-30 ENCOUNTER — Telehealth: Payer: Self-pay | Admitting: Family Medicine

## 2018-12-30 NOTE — Telephone Encounter (Signed)
Medication Refill - Medication:furosemide (LASIX) 80 MG tablet [[id number]]     Has the patient contacted their pharmacy? Yes.   (Agent: If no, request that the patient contact the pharmacy for the refill.) (Agent: If yes, when and what did the pharmacy advise?)  Preferred Pharmacy (with phone number or street name): [facility], [place] stated that she started taking 2 of tabs in the morning and 2 at night and it seems to be working.She stated she just changed to this does and it is working better. I told her she made need an appt for dose change   Agent: Please be advised that RX refills may take up to 3 business days. We ask that you follow-up with your pharmacy.

## 2019-01-03 ENCOUNTER — Telehealth: Payer: Self-pay

## 2019-01-03 NOTE — Telephone Encounter (Signed)
Pt would like to know why her refill request has not been approved and sent to her pharmacy.

## 2019-01-03 NOTE — Telephone Encounter (Signed)
Copied from [place] [id number]. Topic: General - Other >> Dec 31, 2018  4:40 PM Rainey Pines A wrote: Patient called to inform provider that she is taking furosemide (LASIX) 80 MG tablet  2 in morning and 2 at night and it seems to be working for patient.

## 2019-01-04 NOTE — Telephone Encounter (Signed)
Patient has started taking 2 tablets in the morning and 2 in the evening. She has an appointment tomorrow and stated she will discuss this with Dr. Sarajane Jews then.

## 2019-01-04 NOTE — Telephone Encounter (Signed)
Noted  

## 2019-01-04 NOTE — Telephone Encounter (Signed)
RX was last filled 12/09/2018 with one refill. This medication is not due again until 02/08/2019.

## 2019-01-05 ENCOUNTER — Other Ambulatory Visit: Payer: Self-pay

## 2019-01-05 ENCOUNTER — Ambulatory Visit (INDEPENDENT_AMBULATORY_CARE_PROVIDER_SITE_OTHER): Payer: Medicare Other | Admitting: Family Medicine

## 2019-01-05 ENCOUNTER — Encounter: Payer: Self-pay | Admitting: Family Medicine

## 2019-01-05 VITALS — BP 136/78 | HR 86 | Temp 98.0°F | Wt 171.0 lb

## 2019-01-05 DIAGNOSIS — N183 Chronic kidney disease, stage 3 unspecified: Secondary | ICD-10-CM

## 2019-01-05 DIAGNOSIS — I255 Ischemic cardiomyopathy: Secondary | ICD-10-CM

## 2019-01-05 DIAGNOSIS — E1122 Type 2 diabetes mellitus with diabetic chronic kidney disease: Secondary | ICD-10-CM | POA: Insufficient documentation

## 2019-01-05 DIAGNOSIS — J439 Emphysema, unspecified: Secondary | ICD-10-CM | POA: Diagnosis not present

## 2019-01-05 DIAGNOSIS — I5042 Chronic combined systolic (congestive) and diastolic (congestive) heart failure: Secondary | ICD-10-CM | POA: Diagnosis not present

## 2019-01-05 MED ORDER — FUROSEMIDE 80 MG PO TABS: ORAL_TABLET | ORAL | 11 refills | Status: DC

## 2019-01-05 MED ORDER — DULOXETINE HCL 60 MG PO CPEP: 60.0000 mg | ORAL_CAPSULE | Freq: Two times a day (BID) | ORAL | 3 refills | Status: DC

## 2019-01-05 NOTE — Progress Notes (Signed)
   Subjective:    Patient ID: Sydney Phillips, female    DOB: 07-30-1935, 83 y.o.   MRN: [id number]  HPI Here to follow up on issues. She has been doing very well the past month after we went to dosing the Lasix 80 mg to 2 tabs BID. The fluid has gone down in her legs and she is breathing more easily. Her BP has been stable at home. Her glucoses had been running high (she had a fasting reading of 295 a few days ago) but she realized that she had been drinking 4 Pepsi's a day. Now she has cut back to 1 soda a day and her fasting glucose this morning is down to 121. She still wears oxygen in bed at night but she seldom uses it during the day.    Review of Systems  Constitutional: Negative.   Respiratory: Positive for shortness of breath. Negative for cough.   Cardiovascular: Negative.   Gastrointestinal: Negative.   Genitourinary: Negative.        Objective:   Physical Exam Constitutional:      Comments: In a wheelchair   Cardiovascular:     Rate and Rhythm: Normal rate and regular rhythm.     Pulses: Normal pulses.     Heart sounds: Normal heart sounds.  Pulmonary:     Effort: Pulmonary effort is normal. No respiratory distress.     Breath sounds: Normal breath sounds. No stridor. No wheezing, rhonchi or rales.  Musculoskeletal:     Right lower leg: No edema.     Left lower leg: No edema.  Lymphadenopathy:     Cervical: No cervical adenopathy.  Neurological:     Mental Status: She is alert.           Assessment & Plan:  Her HTN is stable. Her CHF is stable and she is in a good fluid balance right now. We will stay on 4 Lasix tablets daily.  We will have her make a lab appt soon to check her potassium level. We wil check a BMET for her renal status. We will check an A1c for the diabetes.  Alysia Penna, MD

## 2019-01-10 DIAGNOSIS — M5127 Other intervertebral disc displacement, lumbosacral region: Secondary | ICD-10-CM | POA: Diagnosis not present

## 2019-01-10 DIAGNOSIS — J449 Chronic obstructive pulmonary disease, unspecified: Secondary | ICD-10-CM | POA: Diagnosis not present

## 2019-01-10 DIAGNOSIS — J439 Emphysema, unspecified: Secondary | ICD-10-CM | POA: Diagnosis not present

## 2019-01-24 ENCOUNTER — Ambulatory Visit (INDEPENDENT_AMBULATORY_CARE_PROVIDER_SITE_OTHER): Payer: Medicare Other

## 2019-01-24 DIAGNOSIS — I5042 Chronic combined systolic (congestive) and diastolic (congestive) heart failure: Secondary | ICD-10-CM | POA: Diagnosis not present

## 2019-01-24 DIAGNOSIS — Z95810 Presence of automatic (implantable) cardiac defibrillator: Secondary | ICD-10-CM | POA: Diagnosis not present

## 2019-01-25 ENCOUNTER — Telehealth: Payer: Self-pay

## 2019-01-25 NOTE — Telephone Encounter (Signed)
Duplicate/error

## 2019-01-25 NOTE — Telephone Encounter (Signed)
Remote ICM transmission received.  Attempted call to patient regarding ICM remote transmission and left detailed message per DPR, with next ICM remote transmission date of 03/28/2019.  Advised to return call for any fluid symptoms or questions.

## 2019-01-25 NOTE — Progress Notes (Signed)
EPIC Encounter for ICM Monitoring  Patient Name: REMEDY CORPORAN is a 83 y.o. female Date: 01/25/2019 Primary Care Physican: Laurey Morale, MD Primary Cardiologist:Croitoru Electrophysiologist: Croitoru BiV Pacing: 97% (Effective94%) LastWeight:182 lbs 5/25/2020Weight:188 lbs (187-190)       Attempted call to patient and unable to reach.  Left detailed message per DPR regarding transmission. Transmission reviewed.   OptivolThoracic impedance close to normal baseline.  Prescribed:Furosemide 80 mg2 tablets(160 mg total)by mouth twice a day (prescribed by Dr Sharlene Motts).Potassium 20 mEq 2tablets (40 mEq total)twice a day.  Labs: 04/23/2018 Creatinine 1.33, BUN 25, Potassium 4.3, Sodium 141, GFR 38-44 02/01/2018 Creatinine 1.22, BUN 17, Potassium 3.4, Sodium 140, GFR 45.07 07/30/2017 Creatinine1.55, BUN40, Potassium5.1, Sodium135 A complete set of results can be found in Results Review  Recommendations: Left voice mail with ICM number and encouraged to call if experiencing any fluid symptoms.  Follow-up plan: ICM clinic phone appointment on10/05/2019.  Copy of ICM check sent to Dr.Croitoru.  3 month ICM trend: 01/24/2019    1 Year ICM trend:       [name], RN 01/25/2019 10:30 AM

## 2019-01-25 NOTE — Progress Notes (Signed)
Thanks

## 2019-02-09 ENCOUNTER — Ambulatory Visit (INDEPENDENT_AMBULATORY_CARE_PROVIDER_SITE_OTHER): Payer: Medicare Other | Admitting: *Deleted

## 2019-02-09 DIAGNOSIS — I255 Ischemic cardiomyopathy: Secondary | ICD-10-CM

## 2019-02-09 LAB — CUP PACEART REMOTE DEVICE CHECK
Battery Remaining Longevity: 32 mo
Battery Voltage: 2.95 V
Brady Statistic AP VP Percent: 9.44 %
Brady Statistic AP VS Percent: 0.04 %
Brady Statistic AS VP Percent: 88.84 %
Brady Statistic AS VS Percent: 1.68 %
Brady Statistic RA Percent Paced: 9.1 %
Brady Statistic RV Percent Paced: 92.58 %
Date Time Interrogation Session: 20200826071855
HighPow Impedance: 57 Ohm
HighPow Impedance: 70 Ohm
Implantable Lead Implant Date: 20091202
Implantable Lead Implant Date: 20091202
Implantable Lead Implant Date: 20091202
Implantable Lead Location: 753858
Implantable Lead Location: 753859
Implantable Lead Location: 753860
Implantable Lead Model: 185
Implantable Lead Model: 4194
Implantable Lead Model: 4470
Implantable Lead Serial Number: 308680
Implantable Lead Serial Number: 635533
Implantable Pulse Generator Implant Date: 20180919
Lead Channel Impedance Value: 285 Ohm
Lead Channel Impedance Value: 418 Ohm
Lead Channel Impedance Value: 475 Ohm
Lead Channel Impedance Value: 551 Ohm
Lead Channel Impedance Value: 760 Ohm
Lead Channel Impedance Value: 779 Ohm
Lead Channel Pacing Threshold Amplitude: 0.5 V
Lead Channel Pacing Threshold Amplitude: 0.625 V
Lead Channel Pacing Threshold Amplitude: 2.25 V
Lead Channel Pacing Threshold Pulse Width: 0.4 ms
Lead Channel Pacing Threshold Pulse Width: 0.4 ms
Lead Channel Pacing Threshold Pulse Width: 1 ms
Lead Channel Sensing Intrinsic Amplitude: 29.375 mV
Lead Channel Sensing Intrinsic Amplitude: 29.375 mV
Lead Channel Sensing Intrinsic Amplitude: 3.875 mV
Lead Channel Sensing Intrinsic Amplitude: 3.875 mV
Lead Channel Setting Pacing Amplitude: 1.5 V
Lead Channel Setting Pacing Amplitude: 2 V
Lead Channel Setting Pacing Amplitude: 3.5 V
Lead Channel Setting Pacing Pulse Width: 0.4 ms
Lead Channel Setting Pacing Pulse Width: 1 ms
Lead Channel Setting Sensing Sensitivity: 0.3 mV

## 2019-02-10 DIAGNOSIS — M5127 Other intervertebral disc displacement, lumbosacral region: Secondary | ICD-10-CM | POA: Diagnosis not present

## 2019-02-10 DIAGNOSIS — J449 Chronic obstructive pulmonary disease, unspecified: Secondary | ICD-10-CM | POA: Diagnosis not present

## 2019-02-10 DIAGNOSIS — J439 Emphysema, unspecified: Secondary | ICD-10-CM | POA: Diagnosis not present

## 2019-02-17 ENCOUNTER — Encounter: Payer: Self-pay | Admitting: Cardiology

## 2019-02-17 NOTE — Progress Notes (Signed)
Remote ICD transmission.   

## 2019-03-09 ENCOUNTER — Other Ambulatory Visit: Payer: Self-pay

## 2019-03-09 ENCOUNTER — Ambulatory Visit (INDEPENDENT_AMBULATORY_CARE_PROVIDER_SITE_OTHER): Payer: Medicare Other | Admitting: Family Medicine

## 2019-03-09 ENCOUNTER — Encounter: Payer: Self-pay | Admitting: Family Medicine

## 2019-03-09 VITALS — BP 122/70 | Temp 98.1°F

## 2019-03-09 DIAGNOSIS — E1122 Type 2 diabetes mellitus with diabetic chronic kidney disease: Secondary | ICD-10-CM

## 2019-03-09 DIAGNOSIS — M25562 Pain in left knee: Secondary | ICD-10-CM

## 2019-03-09 DIAGNOSIS — I272 Pulmonary hypertension, unspecified: Secondary | ICD-10-CM | POA: Diagnosis not present

## 2019-03-09 DIAGNOSIS — J439 Emphysema, unspecified: Secondary | ICD-10-CM

## 2019-03-09 DIAGNOSIS — G4733 Obstructive sleep apnea (adult) (pediatric): Secondary | ICD-10-CM

## 2019-03-09 DIAGNOSIS — N183 Chronic kidney disease, stage 3 (moderate): Secondary | ICD-10-CM | POA: Diagnosis not present

## 2019-03-09 DIAGNOSIS — M25561 Pain in right knee: Secondary | ICD-10-CM

## 2019-03-09 DIAGNOSIS — Z23 Encounter for immunization: Secondary | ICD-10-CM | POA: Diagnosis not present

## 2019-03-09 DIAGNOSIS — J449 Chronic obstructive pulmonary disease, unspecified: Secondary | ICD-10-CM

## 2019-03-09 MED ORDER — HYDROCODONE-ACETAMINOPHEN 10-325 MG PO TABS: 1.0000 | ORAL_TABLET | ORAL | 0 refills | Status: DC | PRN

## 2019-03-09 NOTE — Addendum Note (Signed)
Addended by: Elmer Picker on: 03/09/2019 04:12 PM   Modules accepted: Orders

## 2019-03-09 NOTE — Progress Notes (Signed)
   Subjective:    Patient ID: [name], female    DOB: 1935-11-05, 83 y.o.   MRN: [id number]  HPI Here to discuss leg pains. She has used Norco off and on in the past. She has aching pains that keep her up at night. Tylenol does not help. Her SOB is stable, she uses her oxygen as needed.    Review of Systems  Constitutional: Negative.   Respiratory: Positive for shortness of breath.   Cardiovascular: Negative.   Musculoskeletal: Positive for arthralgias and myalgias.  Neurological: Negative.        Objective:   Physical Exam Constitutional:      Comments: In a wheelchair   Cardiovascular:     Rate and Rhythm: Normal rate and regular rhythm.     Pulses: Normal pulses.     Heart sounds: Normal heart sounds.  Pulmonary:     Effort: Pulmonary effort is normal. No respiratory distress.     Breath sounds: Normal breath sounds. No stridor. No wheezing, rhonchi or rales.  Musculoskeletal:     Right lower leg: No edema.     Left lower leg: No edema.  Neurological:     Mental Status: She is alert.           Assessment & Plan:  Her CHF and COPD are stable. For leg pains she can use Norco prn.  Alysia Penna, MD

## 2019-03-09 NOTE — Addendum Note (Signed)
Addended by: Gwenyth Ober R on: 03/09/2019 04:31 PM   Modules accepted: Orders

## 2019-03-10 ENCOUNTER — Encounter: Payer: Self-pay | Admitting: Family Medicine

## 2019-03-10 ENCOUNTER — Telehealth (INDEPENDENT_AMBULATORY_CARE_PROVIDER_SITE_OTHER): Payer: Medicare Other | Admitting: Family Medicine

## 2019-03-10 DIAGNOSIS — Z0000 Encounter for general adult medical examination without abnormal findings: Secondary | ICD-10-CM

## 2019-03-10 LAB — BASIC METABOLIC PANEL
BUN: 21 mg/dL (ref 6–23)
CO2: 36 mEq/L — ABNORMAL HIGH (ref 19–32)
Calcium: 10.6 mg/dL — ABNORMAL HIGH (ref 8.4–10.5)
Chloride: 97 mEq/L (ref 96–112)
Creatinine, Ser: 1.1 mg/dL (ref 0.40–1.20)
GFR: 47.66 mL/min — ABNORMAL LOW (ref >60.00)
Glucose, Bld: 131 mg/dL — ABNORMAL HIGH (ref 70–99)
Potassium: 4.3 mEq/L (ref 3.5–5.1)
Sodium: 141 mEq/L (ref 135–145)

## 2019-03-10 LAB — CBC WITH DIFFERENTIAL/PLATELET
Basophils Absolute: 0.1 10*3/uL (ref 0.0–0.1)
Basophils Relative: 0.7 % (ref 0.0–3.0)
Eosinophils Absolute: 0.3 10*3/uL (ref 0.0–0.7)
Eosinophils Relative: 3.5 % (ref 0.0–5.0)
HCT: 40.5 % (ref 36.0–46.0)
Hemoglobin: 13.3 g/dL (ref 12.0–15.0)
Lymphocytes Relative: 12.6 % (ref 12.0–46.0)
Lymphs Abs: 1.2 10*3/uL (ref 0.7–4.0)
MCHC: 32.9 g/dL (ref 30.0–36.0)
MCV: 101 fl — ABNORMAL HIGH (ref 78.0–100.0)
Monocytes Absolute: 0.8 10*3/uL (ref 0.1–1.0)
Monocytes Relative: 8.6 % (ref 3.0–12.0)
Neutro Abs: 7.1 10*3/uL (ref 1.4–7.7)
Neutrophils Relative %: 74.6 % (ref 43.0–77.0)
Platelets: 253 10*3/uL (ref 150.0–400.0)
RBC: 4 Mil/uL (ref 3.87–5.11)
RDW: 17.5 % — ABNORMAL HIGH (ref 11.5–15.5)
WBC: 9.5 10*3/uL (ref 4.0–10.5)

## 2019-03-10 LAB — HEMOGLOBIN A1C: Hgb A1c MFr Bld: 7.2 % — ABNORMAL HIGH (ref 4.6–6.5)

## 2019-03-10 NOTE — Patient Instructions (Addendum)
   Sydney Phillips , Thank you for taking time to come for your Medicare Wellness Visit. I appreciate your ongoing commitment to your health goals. Please review the following plan we discussed and let me know if I can assist you in the future.   These are the goals we discussed: Goals   Great caution with ambulation: -use walker at all times -ensure house is well lit as able and that tripping hazards are removed -take you time getting up and when you first start walking to get your balance first before starting to walk  Health diet: -at least 3 meals per day with vegetables and protein (meats, beans, tofu, etc) -try to avoid sweets and junk food  Activity: -try to stay as active as possible -even chair exercises can help improve strength and health      This is a list of the screening recommended for you and due dates:  Health Maintenance  Topic Date Due  . Urine Protein Check  01/26/1946 - please check with Dr. Sarajane Jews the next time you come into the office  . Colon Cancer Screening  06/16/2016 - please contact your gastroenterologist to see if any further check up is needed  . Hemoglobin A1C  11/08/2018  . Eye exam for diabetics  03/10/2019 - please see the eye doctor when you are able for a check up and diabetic eye exam  . Complete foot exam   03/09/2020* - please do with your doctor the next time you are in the office  . DEXA scan (bone density measurement)  03/09/2020* - please let us know if you change you mind and want to do this.  . Tetanus Vaccine  03/09/2020* patient declined  . Flu Shot  Completed  . Pneumonia vaccines  Completed  *Topic was postponed. The date shown is not the original due date.

## 2019-03-10 NOTE — Progress Notes (Signed)
Medicare Annual Preventive Care Visit  (initial annual wellness or annual wellness exam)  Virtual Visit via AWV  I connected with Sydney Phillips  on 11/02/18 at  3:20 PM EDT by a video enabled telemedicine application and verified that I am speaking with the correct person using two identifiers. She was not able to do video but want to do a telephone call.  Location patient: home Location provider:work or home office Persons participating in the virtual visit: patient, provider  Concerns and/or follow up today:  [name] is a pleasant 83 yo seen for her AWV. She saw her PCP for chronic disease management recently and had some lab work yesterday. She has a PMH significant for DM, HTN, HLD, COPD, Cardiomyopathy, CVD, CKD, GERD, arthritis, fibromyalgia and a history of Anxiety and Depression. She saw her PCP yesterday for some pain issues and his notes remark she is stable otherwise.   She is due for the following HM -foot exam - will need to do with PCP at next follow up -eye exam - reports saw her eye doctor 2 years ago and declines a return now as has suffered financial hardship during the pandemic, she will consider once pandemic is over and son has a job again -urine micro/alb - declines, just saw PCP and did labs -tetanus - refuses, does not feel she needs this vaccinne -dexa - refuses for now -colon cancer screening? Had colonoscopy in 2015 and per report on brief review of the chart it appears the advised a repeat 3 years later. She reports they told her she did not have to return.   See HM section in Epic for other details of completed HM. See scanned documentation under Media Tab for further documentation HPI, health risk assessment. See Media Tab and Care Teams sections in Epic for other providers.  ROS: negative for report of fevers, unintentional weight loss, vision changes, vision loss, hearing loss or change, chest pain, sob, hemoptysis, melena, hematochezia, hematuria, genital discharge  or lesions,  bleeding or bruising, loc, thoughts of suicide or self harm, memory loss  1.) Patient-completed health risk assessment  - completed and reviewed, see scanned documentation  2.) Review of Medical History: -PMH, PSH, Family History and current specialty and care providers reviewed and updated and listed below  - see scanned in document in chart and below Patient Care Team: Laurey Morale, MD as PCP - General Croitoru, Dani Gobble, MD as PCP - Cardiology (Cardiology) Stanford Breed Denice Bors, MD as Consulting Physician (Cardiology) Pyrtle, Lajuan Lines, MD as Consulting Physician (Gastroenterology)   Past Medical History:  Diagnosis Date  . Anxiety   . Arthritis   . Asthma   . Automatic implantable cardioverter-defibrillator in situ   . Bronchitis   . CAD (coronary artery disease)    a. s/p DES to LCx/RCA 05/2016, ostial LAD disease.  . Cardiomyopathy ([place])   . Chronic back pain   . Chronic combined systolic and diastolic CHF (congestive heart failure) ([place])   . Chronic constipation   . Chronic pain   . CKD (chronic kidney disease), stage III ([place])   . Colon polyps 2003.  2015.   HP polyps 2003.  adnomas 2015.  required referal to [facility] for colonoscopic resection of flat polyps.   Marland Kitchen COPD (chronic obstructive pulmonary disease) ([place])   . Dementia ([place])   . Depression   . Depression with anxiety    takes Cymbalta daily  . Diabetes mellitus ([place])   . Dyspnea   . Early cataracts, bilateral   .  Fibromyalgia   . GERD (gastroesophageal reflux disease)    was on meds but was taken off;now watches what she eats  . Hemorrhoids   . History of kidney stones   . Hx of colonic polyps   . Hyperlipemia    takes Crestor daily  . Hypertension    takes Amlodipine and Metoprolol daily  . Insomnia   . LBBB (left bundle branch block)    Stress test 09/03/2010, EF 55  . Myocardial infarction ([place])   . PAD (peripheral artery disease) ([facility])    Carotid, subclavian, and lower extremity beds,  currently not symptomatic  . Presence of combination internal cardiac defibrillator (ICD) and pacemaker   . Presence of permanent cardiac pacemaker   . Pulmonary hypertension ([place])   . S/P angioplasty with stent, lt. subclavian 07/31/11 08/01/2011  . Subclavian arterial stenosis, lt, with PTA/STENT 07/31/11 08/01/2011  . Syncope 07/28/2011   EF - 50-55, moderate concentric hypertrophy in left ventricle  . Urinary incontinence   . Vertigo    takes Meclizine prn    Past Surgical History:  Procedure Laterality Date  . ABDOMINAL AORTAGRAM N/A 08/12/2013   Procedure: ABDOMINAL Maxcine Ham;  Surgeon: Elam Dutch, MD;  Location: [facility] CATH LAB;  Service: Cardiovascular;  Laterality: N/A;  . ABDOMINAL HYSTERECTOMY    . APPENDECTOMY    . BACK SURGERY  2012  . BIV ICD GENERTAOR CHANGE OUT Left 02/20/2012   Procedure: BIV ICD GENERTAOR CHANGE OUT;  Surgeon: [name], MD;  Location: [facility] CATH LAB;  Service: Cardiovascular;  Laterality: Left;  . CARDIAC CATHETERIZATION  12/01/2007   By Dr. Melvern Banker, left heart cath,   . CARDIAC CATHETERIZATION N/A 05/20/2016   Procedure: Right/Left Heart Cath and Coronary Angiography;  Surgeon: Sherren Mocha, MD;  Location: [place] CV LAB;  Service: Cardiovascular;  Laterality: N/A;  . CARDIAC CATHETERIZATION N/A 05/20/2016   Procedure: Coronary Stent Intervention;  Surgeon: Sherren Mocha, MD;  Location: [place] CV LAB;  Service: Cardiovascular;  Laterality: N/A;  . CARDIAC DEFIBRILLATOR PLACEMENT  05/2008   By Dr Blanch Media, [employer] CANNOT HAVE MRI's  . CAROTID ANGIOGRAM N/A 07/31/2011   Procedure: CAROTID ANGIOGRAM;  Surgeon: Lorretta Harp, MD;  Location: [facility] CATH LAB;  Service: Cardiovascular;  Laterality: N/A;  carotid angiogram and possible Lt SCA PTA  . COLONOSCOPY W/ POLYPECTOMY  12/2013  . CORONARY ANGIOPLASTY    . ENDARTERECTOMY Left 11/08/2014   Procedure: LEFT CAROTID ENDARTERECTOMY WITH HEMASHIELD PATCH ANGIOPLASTY;  Surgeon: Elam Dutch, MD;   Location: [place];  Service: Vascular;  Laterality: Left;  . ESOPHAGOGASTRODUODENOSCOPY N/A 01/20/2014   Procedure: ESOPHAGOGASTRODUODENOSCOPY (EGD);  Surgeon: Jerene Bears, MD;  Location: [facility] ENDOSCOPY;  Service: Endoscopy;  Laterality: N/A;  . FEMORAL-POPLITEAL BYPASS GRAFT Right 10/12/2013   Procedure:   Femoral-Peroneal trunk  bypass with nonreversed greater saphenous vein graft;  Surgeon: Elam Dutch, MD;  Location: [place];  Service: Vascular;  Laterality: Right;  . GIVENS CAPSULE STUDY N/A 01/20/2014   Procedure: GIVENS CAPSULE STUDY;  Surgeon: Jerene Bears, MD;  Location: [place];  Service: Gastroenterology;  Laterality: N/A;  . ICD GENERATOR CHANGEOUT N/A 03/04/2017   Procedure: ICD Generator Changeout;  Surgeon: [name], MD;  Location: [place] CV LAB;  Service: Cardiovascular;  Laterality: N/A;  . INSERT / REPLACE / REMOVE PACEMAKER    . INTRAOPERATIVE ARTERIOGRAM Right 10/12/2013   Procedure: INTRA OPERATIVE ARTERIOGRAM;  Surgeon: Elam Dutch, MD;  Location: [place];  Service: Vascular;  Laterality: Right;  . ORIF ELBOW FRACTURE  08/16/2011   Procedure: OPEN REDUCTION INTERNAL FIXATION (ORIF) ELBOW/OLECRANON FRACTURE;  Surgeon: Schuyler Amor, MD;  Location: [place];  Service: Orthopedics;  Laterality: Left;  . RENAL ANGIOGRAM N/A 08/12/2013   Procedure: RENAL ANGIOGRAM;  Surgeon: Elam Dutch, MD;  Location: [facility] CATH LAB;  Service: Cardiovascular;  Laterality: N/A;  . SUBCLAVIAN STENT PLACEMENT Left 07/31/2011   7x18 Genesis, balloon, with reduction of 90% ostial left subclavian artery stenosis to 0% with residual excellent flow  . TONSILLECTOMY    . TUBAL LIGATION      Social History   Socioeconomic History  . Marital status: Widowed    Spouse name: Not on file  . Number of children: 3  . Years of education: 46  . Highest education level: Not on file  Occupational History  . Occupation: Retired  Scientific laboratory technician  . Financial resource strain: Not on file  .  Food insecurity    Worry: Not on file    Inability: Not on file  . Transportation needs    Medical: Not on file    Non-medical: Not on file  Tobacco Use  . Smoking status: Former Smoker    Packs/day: 0.50    Years: 62.00    Pack years: 31.00    Types: Cigarettes    Quit date: 07/19/2017    Years since quitting: 1.6  . Smokeless tobacco: Never Used  Substance and Sexual Activity  . Alcohol use: No    Alcohol/week: 0.0 standard drinks  . Drug use: No  . Sexual activity: Never    Birth control/protection: Surgical  Lifestyle  . Physical activity    Days per week: Not on file    Minutes per session: Not on file  . Stress: Not on file  Relationships  . Social Herbalist on phone: Not on file    Gets together: Not on file    Attends religious service: Not on file    Active member of club or organization: Not on file    Attends meetings of clubs or organizations: Not on file    Relationship status: Not on file  . Intimate partner violence    Fear of current or ex partner: Not on file    Emotionally abused: Not on file    Physically abused: Not on file    Forced sexual activity: Not on file  Other Topics Concern  . Not on file  Social History Narrative   Ok to share information with medical POA, Son Gabriel Carina   Right-handed   Caffeine: Pepsi    Family History  Problem Relation Age of Onset  . CAD Father   . Heart disease Father   . Hyperlipidemia Father   . Heart disease Mother   . Deep vein thrombosis Son   . Hyperlipidemia Other   . Colon cancer Maternal Grandmother   . Cancer Sister        ovarian  . Diabetes Sister   . Heart disease Sister   . Anesthesia problems Neg Hx   . Hypotension Neg Hx   . Malignant hyperthermia Neg Hx   . Pseudochol deficiency Neg Hx     Current Outpatient Medications on File Prior to Visit  Medication Sig Dispense Refill  . ACCU-CHEK FASTCLIX LANCETS MISC USE   TO CHECK GLUCOSE ONCE DAILY 102 each 5  . ACCU-CHEK  SMARTVIEW test strip USE  STRIP TO CHECK GLUCOSE ONCE DAILY 100 each  1  . acetaminophen (TYLENOL) 500 MG tablet Take 1 tablet (500 mg total) by mouth 2 (two) times daily. 30 tablet 0  . albuterol (PROVENTIL) (2.5 MG/3ML) 0.083% nebulizer solution USE 1 VIAL IN NEBULIZER EVERY 4 HOURS AS NEEDED FOR WHEEZING OR SHORTNESS OF BREATH 75 vial 0  . allopurinol (ZYLOPRIM) 300 MG tablet Take 1 tablet by mouth once daily 90 tablet 3  . allopurinol (ZYLOPRIM) 300 MG tablet Take 1 tablet (300 mg total) by mouth daily. 90 tablet 3  . ALPRAZolam (XANAX) 1 MG tablet TAKE 1 TABLET BY MOUTH IN THE MORNING AND TAKE 1 & 1/2 (ONE & ONE-HALF) AT NIGHT AS NEEDED FOR ANXIETY 75 tablet 5  . ALPRAZolam (XANAX) 1 MG tablet Take 1 tablet (1 mg total) by mouth 2 (two) times daily as needed for anxiety. Take 1 tablet in the morning, and 1.5 tablets at night 75 tablet 5  . aspirin 81 MG chewable tablet Chew 1 tablet (81 mg total) by mouth daily. 30 tablet 1  . azelastine (ASTELIN) 0.1 % nasal spray USE 2 SPRAY(S) IN EACH NOSTRIL TWICE DAILY **USE  IN  EACH  NOSTRIL  AS  DIRECTED 30 mL 0  . bisoprolol (ZEBETA) 5 MG tablet Take 1 tablet (5 mg total) by mouth daily. 90 tablet 3  . cholecalciferol (VITAMIN D) 1000 units tablet Take 1,000 Units by mouth daily.    . clopidogrel (PLAVIX) 75 MG tablet Take 1 tablet (75 mg total) by mouth daily. 90 tablet 3  . cyanocobalamin 1000 MCG tablet Take 1,000 mcg by mouth daily.    . DULoxetine (CYMBALTA) 60 MG capsule Take 1 capsule (60 mg total) by mouth 2 (two) times daily. 270 capsule 3  . furosemide (LASIX) 80 MG tablet TAKE 2 TABLETS BY MOUTH IN THE MORNING AND 2 IN THE EVENING 120 tablet 11  . HYDROcodone-acetaminophen (NORCO) 10-325 MG tablet Take 1 tablet by mouth every 4 (four) hours as needed for moderate pain. 30 tablet 0  . Magnesium Oxide 400 MG CAPS Take 1 capsule (400 mg total) by mouth 2 (two) times daily. 180 capsule 3  . nitroGLYCERIN (NITROSTAT) 0.4 MG SL tablet Place 0.4 mg  under the tongue every 5 (five) minutes as needed for chest pain. X 3 doses    . potassium chloride SA (K-DUR) 20 MEQ tablet Take 2 tablets (40 mEq total) by mouth 2 (two) times daily. 360 tablet 3  . PROAIR HFA 108 (90 Base) MCG/ACT inhaler Inhale 2 puffs into the lungs every 6 (six) hours as needed for wheezing or shortness of breath.     . traZODone (DESYREL) 50 MG tablet Take 1 tablet (50 mg total) by mouth at bedtime. (Patient taking differently: Take 50 mg by mouth at bedtime as needed for sleep. ) 90 tablet 3   No current facility-administered medications on file prior to visit.      3.) Review of functional ability and level of safety:  Any difficulty hearing?  See scanned documentation. Uses hearing aides.  History of falling?  See scanned documentation, she reports a history of many many falls in the past. Discussed at length along with precautions. Saw PCP yesterday.  Any trouble with IADLs - using a phone, using transportation, grocery shopping, preparing meals, doing housework, doing laundry, taking medications and managing money?  See scanned documentation  Advance Directives?  yes.  See summary of recommendations in Patient Instructions below.  4.) Physical Exam  There were no vitals filed for this  visit. Estimated body mass index is 28.02 kg/m as calculated from the following:   Height as of 08/19/18: 5' 5.5" (1.664 m).   Weight as of 01/05/19: 171 lb (77.6 kg).  EKG (optional): deferred  GENERAL: alert, oriented, no audible sounds of distress  HEENT: deferred due to telophonic visit  PSYCH/NEURO: pleasant and cooperative, no obvious audible depression or anxiety, speech and thought processing grossly intact, Cognitive function grossly intact  Depression screen [facility] 2/9 03/10/2019 05/06/2016 04/14/2016  Decreased Interest 0 0 0  Down, Depressed, Hopeless 0 0 0  PHQ - 2 Score 0 0 0  Some recent data might be hidden     See patient instructions for  recommendations.  Education and counseling regarding the above review of health provided with a plan for the following: -see scanned patient completed form for further details -fall prevention strategies discussed at length, see patient instructions for goals and descriptions below -healthy lifestyle discussed and advised -importance and resources for completing advanced directives discussed - asked assistant to mail -see patient instructions below for any other recommendations provided  4)The following written screening schedule of preventive measures were reviewed with assessment and plan made per below, orders and patient instructions:           Alcohol screening done     Obesity Screening and counseling done     STI screening (Hep C if born 65-65) offered and per pt wishes     Tobacco Screening done       Pneumococcal (PPSV23 -one dose after 83, one before if risk factors), influenza yearly and hepatitis B vaccines (if high risk - end stage renal disease, IV drugs, homosexual men, live in home for mentally retarded, hemophilia receiving factors) ASSESSMENT/PLAN: done per epic review      Screening mammograph (yearly if >40) ASSESSMENT/PLAN: declined due to age      Screening Pap smear/pelvic exam (q2 years) ASSESSMENT/PLAN: n/a, declined      Colorectal cancer screening (FOBT yearly or flex sig q4y or colonoscopy q10y or barium enema q4y) ASSESSMENT/PLAN: advised that she contact her GI office for recommendations, also sent message to my staff to reach out to the GI off to see what they advise and then let PCP know if anything is needed on our end to assist her on any further follow up.      Diabetes outpatient self-management training services ASSESSMENT/PLAN: utd or done recently with PCP      Bone mass measurements(covered q2y if indicated - estrogen def, osteoporosis, hyperparathyroid, vertebral abnormalities, osteoporosis or steroids) ASSESSMENT/PLAN: patient refused at  this time      Screening for glaucoma(q1y if high risk - diabetes, FH, AA and > 50 or hispanic and > 65) ASSESSMENT/PLAN: utd or advised - advised eye exam with her eye doctor, she refused at this time due to pandemic and financial strain      Medical nutritional therapy for individuals with diabetes or renal disease ASSESSMENT/PLAN: see orders, discussed a healthy low sugar diet      Cardiovascular screening blood tests (lipids q5y) ASSESSMENT/PLAN: see orders and labs from PCP      Diabetes screening tests ASSESSMENT/PLAN: see orders and labs and orders from recent PCP follow up   7.) Summary:   Medicare annual wellness visit, subsequent   -risk factors and conditions per above assessment were discussed and treatment, recommendations and referrals were offered per documentation above and orders and patient instructions.  Patient Instructions     Sydney Phillips ,  Thank you for taking time to come for your Medicare Wellness Visit. I appreciate your ongoing commitment to your health goals. Please review the following plan we discussed and let me know if I can assist you in the future.   These are the goals we discussed: Goals   Great caution with ambulation: -use walker at all times -ensure house is well lit as able and that tripping hazards are removed -take you time getting up and when you first start walking to get your balance first before starting to walk  Health diet: -at least 3 meals per day with vegetables and protein (meats, beans, tofu, etc) -try to avoid sweets and junk food  Activity: -try to stay as active as possible -even chair exercises can help improve strength and health      This is a list of the screening recommended for you and due dates:  Health Maintenance  Topic Date Due  . Urine Protein Check  01/26/1946 - please check with Dr. Sarajane Jews the next time you come into the office  . Colon Cancer Screening  06/16/2016 - please contact your  gastroenterologist to see if any further check up is needed  . Hemoglobin A1C  11/08/2018  . Eye exam for diabetics  03/10/2019 - please see the eye doctor when you are able for a check up and diabetic eye exam  . Complete foot exam   03/09/2020* - please do with your doctor the next time you are in the office  . DEXA scan (bone density measurement)  03/09/2020* - please let us know if you change you mind and want to do this.  . Tetanus Vaccine  03/09/2020* patient declined  . Flu Shot  Completed  . Pneumonia vaccines  Completed  *Topic was postponed. The date shown is not the original due date.    Lucretia Kern, DO

## 2019-03-13 DIAGNOSIS — J439 Emphysema, unspecified: Secondary | ICD-10-CM | POA: Diagnosis not present

## 2019-03-13 DIAGNOSIS — M5127 Other intervertebral disc displacement, lumbosacral region: Secondary | ICD-10-CM | POA: Diagnosis not present

## 2019-03-13 DIAGNOSIS — J449 Chronic obstructive pulmonary disease, unspecified: Secondary | ICD-10-CM | POA: Diagnosis not present

## 2019-03-28 ENCOUNTER — Other Ambulatory Visit: Payer: Self-pay

## 2019-03-28 ENCOUNTER — Ambulatory Visit (INDEPENDENT_AMBULATORY_CARE_PROVIDER_SITE_OTHER): Payer: Medicare Other

## 2019-03-28 DIAGNOSIS — Z95810 Presence of automatic (implantable) cardiac defibrillator: Secondary | ICD-10-CM

## 2019-03-28 DIAGNOSIS — I5042 Chronic combined systolic (congestive) and diastolic (congestive) heart failure: Secondary | ICD-10-CM | POA: Diagnosis not present

## 2019-03-29 NOTE — Progress Notes (Signed)
EPIC Encounter for ICM Monitoring  Patient Name: Sydney Phillips is a 83 y.o. female Date: 03/29/2019 Primary Care Physican: Laurey Morale, MD Primary Cardiologist:Croitoru Electrophysiologist: Croitoru BiV Pacing: 92.5% (Effective 87.1%) LastWeight:182 lbs 7/22/2020Weight:171 lbs   Transmission reviewed.   OptivolThoracic impedance close to normal baseline.  Prescribed:Furosemide 80 mg2 tablets(160 mg total)by mouth twice a day (prescribed by Dr Sharlene Motts).Potassium 20 mEq 2tablets (40 mEq total)twice a day.  Labs: 04/23/2018 Creatinine 1.33, BUN 25, Potassium 4.3, Sodium 141, GFR 38-44 02/01/2018 Creatinine 1.22, BUN 17, Potassium 3.4, Sodium 140, GFR 45.07 07/30/2017 Creatinine1.55, BUN40, Potassium5.1, Sodium135 A complete set of results can be found in Results Review  Recommendations: No changes.  Follow-up plan: ICM clinic phone appointment on 05/02/2019.   91 day device clinic remote transmission 05/11/2019. Marland Kitchen    Copy of ICM check sent to Dr. Sallyanne Kuster.   3 month ICM trend: 03/28/2019    1 Year ICM trend:       [name], RN 03/29/2019 12:43 PM

## 2019-04-05 ENCOUNTER — Telehealth: Payer: Self-pay | Admitting: Family Medicine

## 2019-04-05 NOTE — Telephone Encounter (Signed)
Patient notified of lab results and verbalized understanding. Pt wanted to know if she could start back on Metformin. Pt aware Dr.Fry out of the office until next week. Pt verbalized of update.

## 2019-04-05 NOTE — Telephone Encounter (Signed)
Copied from [place] [id number]. Topic: General - Other >> Apr 05, 2019  4:19 PM Keene Breath wrote: Reason for CRM: Patient called to get the lab results of her previous tests.  Please call patient back to discuss at [phone]

## 2019-04-11 MED ORDER — METFORMIN HCL 500 MG PO TABS: 500.0000 mg | ORAL_TABLET | Freq: Two times a day (BID) | ORAL | 3 refills | Status: DC

## 2019-04-11 NOTE — Telephone Encounter (Signed)
Okay. Call in Metformin 500 mg bid #180 with 3 rf

## 2019-04-12 DIAGNOSIS — M5127 Other intervertebral disc displacement, lumbosacral region: Secondary | ICD-10-CM | POA: Diagnosis not present

## 2019-04-12 DIAGNOSIS — J439 Emphysema, unspecified: Secondary | ICD-10-CM | POA: Diagnosis not present

## 2019-04-12 DIAGNOSIS — J449 Chronic obstructive pulmonary disease, unspecified: Secondary | ICD-10-CM | POA: Diagnosis not present

## 2019-05-02 ENCOUNTER — Ambulatory Visit (INDEPENDENT_AMBULATORY_CARE_PROVIDER_SITE_OTHER): Payer: Medicare Other

## 2019-05-02 DIAGNOSIS — I5042 Chronic combined systolic (congestive) and diastolic (congestive) heart failure: Secondary | ICD-10-CM

## 2019-05-02 DIAGNOSIS — Z95810 Presence of automatic (implantable) cardiac defibrillator: Secondary | ICD-10-CM

## 2019-05-03 ENCOUNTER — Telehealth: Payer: Self-pay

## 2019-05-03 DIAGNOSIS — I5042 Chronic combined systolic (congestive) and diastolic (congestive) heart failure: Secondary | ICD-10-CM

## 2019-05-03 DIAGNOSIS — Z95810 Presence of automatic (implantable) cardiac defibrillator: Secondary | ICD-10-CM

## 2019-05-03 NOTE — Telephone Encounter (Signed)
Remote ICM transmission received.  Attempted call to patient regarding ICM remote transmission and left detailed message per DPR.  Advised to return call for any fluid symptoms or questions. Next ICM remote transmission scheduled 06/06/2019.

## 2019-05-03 NOTE — Progress Notes (Signed)
.  EPIC Encounter for ICM Monitoring  Patient Name: Sydney Phillips is a 83 y.o. female Date: 05/03/2019 Primary Care Physican: Laurey Morale, MD Primary Cardiologist:Croitoru Electrophysiologist: Croitoru BiV Pacing: 94.4% (Effective 91.2%) LastWeight:182 lbs 7/22/2020Weight:171 lbs   Attempted call to patient and unable to reach.  Left detailed message per DPR regarding transmission. Transmission reviewed.   OptivolThoracic impedanceat baseline normal on 05/02/2019 but suggestive of possible fluid accumulation from 11/7 - 11/15  Prescribed:Furosemide 80 mg2 tablets(160 mg total)by mouthtwice a day(prescribed by Dr Sharlene Motts).Potassium 20 mEq 2tablets (40 mEq total)twice a day.  Labs: 04/23/2018 Creatinine 1.33, BUN 25, Potassium 4.3, Sodium 141, GFR 38-44 02/01/2018 Creatinine 1.22, BUN 17, Potassium 3.4, Sodium 140, GFR 45.07 07/30/2017 Creatinine1.55, BUN40, Potassium5.1, Sodium135 A complete set of results can be found in Results Review  Recommendations: Left voice mail with ICM number and encouraged to call if experiencing any fluid symptoms.  Follow-up plan: ICM clinic phone appointment on 06/06/2019.   91 day device clinic remote transmission 05/11/2019.    Copy of ICM check sent to Dr. Sallyanne Kuster.   3 month ICM trend: 05/02/2019    1 Year ICM trend:       [name], RN 05/03/2019 4:02 PM

## 2019-05-06 ENCOUNTER — Other Ambulatory Visit: Payer: Self-pay | Admitting: Family Medicine

## 2019-05-09 NOTE — Telephone Encounter (Signed)
Pt called in and stated that she is out of her Xanax, she stated that the metformin is messing up her stomach and her nerves.   Pt stated that she needs this today   Best number  [facility] in [place]

## 2019-05-10 NOTE — Telephone Encounter (Signed)
Okay for refill?  
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
## 2019-05-11 ENCOUNTER — Ambulatory Visit (INDEPENDENT_AMBULATORY_CARE_PROVIDER_SITE_OTHER): Payer: Medicare Other | Admitting: *Deleted

## 2019-05-11 ENCOUNTER — Ambulatory Visit: Payer: Self-pay | Admitting: *Deleted

## 2019-05-11 DIAGNOSIS — Z95810 Presence of automatic (implantable) cardiac defibrillator: Secondary | ICD-10-CM

## 2019-05-11 LAB — CUP PACEART REMOTE DEVICE CHECK
Battery Remaining Longevity: 28 mo
Battery Voltage: 2.94 V
Brady Statistic AP VP Percent: 0.39 %
Brady Statistic AP VS Percent: 0 %
Brady Statistic AS VP Percent: 95.74 %
Brady Statistic AS VS Percent: 3.87 %
Brady Statistic RA Percent Paced: 0.39 %
Brady Statistic RV Percent Paced: 93.73 %
Date Time Interrogation Session: 20201125025036
HighPow Impedance: 57 Ohm
HighPow Impedance: 68 Ohm
Implantable Lead Implant Date: 20091202
Implantable Lead Implant Date: 20091202
Implantable Lead Implant Date: 20091202
Implantable Lead Location: 753858
Implantable Lead Location: 753859
Implantable Lead Location: 753860
Implantable Lead Model: 185
Implantable Lead Model: 4194
Implantable Lead Model: 4470
Implantable Lead Serial Number: 308680
Implantable Lead Serial Number: 635533
Implantable Pulse Generator Implant Date: 20180919
Lead Channel Impedance Value: 304 Ohm
Lead Channel Impedance Value: 418 Ohm
Lead Channel Impedance Value: 456 Ohm
Lead Channel Impedance Value: 551 Ohm
Lead Channel Impedance Value: 779 Ohm
Lead Channel Impedance Value: 779 Ohm
Lead Channel Pacing Threshold Amplitude: 0.625 V
Lead Channel Pacing Threshold Amplitude: 0.625 V
Lead Channel Pacing Threshold Amplitude: 2.75 V
Lead Channel Pacing Threshold Pulse Width: 0.4 ms
Lead Channel Pacing Threshold Pulse Width: 0.4 ms
Lead Channel Pacing Threshold Pulse Width: 1 ms
Lead Channel Sensing Intrinsic Amplitude: 30.375 mV
Lead Channel Sensing Intrinsic Amplitude: 30.375 mV
Lead Channel Sensing Intrinsic Amplitude: 4 mV
Lead Channel Sensing Intrinsic Amplitude: 4 mV
Lead Channel Setting Pacing Amplitude: 1.5 V
Lead Channel Setting Pacing Amplitude: 2 V
Lead Channel Setting Pacing Amplitude: 3.5 V
Lead Channel Setting Pacing Pulse Width: 0.4 ms
Lead Channel Setting Pacing Pulse Width: 1 ms
Lead Channel Setting Sensing Sensitivity: 0.3 mV

## 2019-05-11 NOTE — Telephone Encounter (Signed)
Patient is checking status of medication  Patient is very upset about her medication. She is very upset regarding this.  Call back [phone]

## 2019-05-11 NOTE — Telephone Encounter (Signed)
Patient checking on the status of medication request, patient called very upset and would like a call back today regarding the status. Patient sounded like she was having a panic attack due to medication below not being filled, transferred her over to [place] [facility], [place]
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
## 2019-05-13 DIAGNOSIS — J449 Chronic obstructive pulmonary disease, unspecified: Secondary | ICD-10-CM | POA: Diagnosis not present

## 2019-05-13 DIAGNOSIS — M5127 Other intervertebral disc displacement, lumbosacral region: Secondary | ICD-10-CM | POA: Diagnosis not present

## 2019-05-13 DIAGNOSIS — J439 Emphysema, unspecified: Secondary | ICD-10-CM | POA: Diagnosis not present

## 2019-05-16 NOTE — Telephone Encounter (Signed)
This was sent in to the pharmacy

## 2019-05-17 ENCOUNTER — Other Ambulatory Visit: Payer: Self-pay | Admitting: Family Medicine

## 2019-06-07 NOTE — Progress Notes (Signed)
ICD remote 

## 2019-06-12 DIAGNOSIS — J439 Emphysema, unspecified: Secondary | ICD-10-CM | POA: Diagnosis not present

## 2019-06-12 DIAGNOSIS — M5127 Other intervertebral disc displacement, lumbosacral region: Secondary | ICD-10-CM | POA: Diagnosis not present

## 2019-06-12 DIAGNOSIS — J449 Chronic obstructive pulmonary disease, unspecified: Secondary | ICD-10-CM | POA: Diagnosis not present

## 2019-06-15 ENCOUNTER — Other Ambulatory Visit: Payer: Self-pay | Admitting: Family Medicine

## 2019-06-20 ENCOUNTER — Telehealth: Payer: Self-pay

## 2019-06-20 ENCOUNTER — Ambulatory Visit (INDEPENDENT_AMBULATORY_CARE_PROVIDER_SITE_OTHER): Payer: Medicare Other

## 2019-06-20 DIAGNOSIS — I5042 Chronic combined systolic (congestive) and diastolic (congestive) heart failure: Secondary | ICD-10-CM

## 2019-06-20 DIAGNOSIS — Z95810 Presence of automatic (implantable) cardiac defibrillator: Secondary | ICD-10-CM

## 2019-06-20 NOTE — Telephone Encounter (Signed)
Remote ICM transmission received.  Attempted call to patient regarding ICM remote transmission and left message to return call   

## 2019-06-20 NOTE — Progress Notes (Signed)
EPIC Encounter for ICM Monitoring  Patient Name: Sydney Phillips is a 84 y.o. female Date: 06/20/2019 Primary Care Physican: Laurey Morale, MD Primary Cardiologist:Croitoru Electrophysiologist: Croitoru BiV Pacing: 93% (Effective90.3%) LastWeight:171lbs   Attempted call to patient and unable to reach.  Left detailed message per DPR regarding transmission. Transmission reviewed.   OptivolThoracic impedancesuggesting possible fluid accumulation since 05/30/2019.  Prescribed:Furosemide 80 mg2 tablets(160 mg total)by mouthtwice a day(prescribed by Dr Sharlene Motts).Potassium 20 mEq 2tablets (40 mEq total)twice a day.  Labs: 03/09/2019 Creatinine 1.10, BUN 21, Potassium 4.3, Sodium 141, GFR 47.66 A complete set of results can be found in Results Review  Recommendations: Unable to reach.    Follow-up plan: ICM clinic phone appointment on 06/27/2019 (manual send) to recheck fluid levels.   91 day device clinic remote transmission 08/10/2019.    Copy of ICM check sent to Dr. Sallyanne Kuster.   3 month ICM trend: 06/20/2019    1 Year ICM trend:       [name], RN 06/20/2019 1:07 PM

## 2019-06-28 ENCOUNTER — Telehealth: Payer: Self-pay

## 2019-06-28 NOTE — Telephone Encounter (Signed)
Left message for patient to remind of missed remote transmission.  

## 2019-06-30 ENCOUNTER — Other Ambulatory Visit: Payer: Self-pay | Admitting: Family Medicine

## 2019-06-30 NOTE — Telephone Encounter (Signed)
Both last filled in 2019 Last OV 03/09/2019  Ok to fill?

## 2019-07-08 ENCOUNTER — Telehealth: Payer: Self-pay
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
## 2019-07-08 NOTE — Telephone Encounter (Signed)
The patient has been set up with an appointment on 1/25 at 12:20 with Dr. Sallyanne Kuster.
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
## 2019-07-11 ENCOUNTER — Encounter: Payer: Medicare Other | Admitting: Cardiovascular Disease

## 2019-07-11 ENCOUNTER — Telehealth: Payer: Self-pay | Admitting: Cardiovascular Disease

## 2019-07-11 ENCOUNTER — Other Ambulatory Visit: Payer: Self-pay | Admitting: *Deleted

## 2019-07-11 DIAGNOSIS — I472 Ventricular tachycardia, unspecified: Secondary | ICD-10-CM

## 2019-07-11 NOTE — Telephone Encounter (Signed)
Patient calling to reschedule her appointment Today 1/25 at 12:20 with Dr. Sallyanne Kuster, because she does not have a ride. There is nothing available until February, but she needs a sooner appointment.

## 2019-07-11 NOTE — Telephone Encounter (Signed)
Returned call to patient, patient states she relies on her sons to bring her to her appt and they have a funeral today, unable to bring her.    Rescheduled for Wed 1/27 with Dr. Sallyanne Kuster (ok per RN).      Patient aware and verbalized understanding.  Advised to plan and confirm with son that she has a ride as she needs to be seen this week.   Patient aware.

## 2019-07-13 ENCOUNTER — Other Ambulatory Visit: Payer: Self-pay

## 2019-07-13 ENCOUNTER — Encounter: Payer: Self-pay | Admitting: Cardiovascular Disease

## 2019-07-13 ENCOUNTER — Ambulatory Visit (INDEPENDENT_AMBULATORY_CARE_PROVIDER_SITE_OTHER): Payer: Medicare Other | Admitting: Cardiovascular Disease

## 2019-07-13 VITALS — BP 142/68 | HR 82 | Ht 65.0 in | Wt 188.0 lb

## 2019-07-13 DIAGNOSIS — I472 Ventricular tachycardia, unspecified: Secondary | ICD-10-CM

## 2019-07-13 DIAGNOSIS — I739 Peripheral vascular disease, unspecified: Secondary | ICD-10-CM | POA: Diagnosis not present

## 2019-07-13 DIAGNOSIS — E7800 Pure hypercholesterolemia, unspecified: Secondary | ICD-10-CM

## 2019-07-13 DIAGNOSIS — I2510 Atherosclerotic heart disease of native coronary artery without angina pectoris: Secondary | ICD-10-CM

## 2019-07-13 DIAGNOSIS — Z95810 Presence of automatic (implantable) cardiac defibrillator: Secondary | ICD-10-CM | POA: Diagnosis not present

## 2019-07-13 DIAGNOSIS — J439 Emphysema, unspecified: Secondary | ICD-10-CM

## 2019-07-13 DIAGNOSIS — I5042 Chronic combined systolic (congestive) and diastolic (congestive) heart failure: Secondary | ICD-10-CM | POA: Diagnosis not present

## 2019-07-13 DIAGNOSIS — J449 Chronic obstructive pulmonary disease, unspecified: Secondary | ICD-10-CM | POA: Diagnosis not present

## 2019-07-13 DIAGNOSIS — M5127 Other intervertebral disc displacement, lumbosacral region: Secondary | ICD-10-CM | POA: Diagnosis not present

## 2019-07-13 NOTE — Progress Notes (Signed)
Patient ID: Sydney Phillips, female   DOB: Nov 15, 1935, 84 y.o.   MRN: [id number]    Cardiology Office Note    Date:  07/14/2019   ID:  Sydney Phillips, Sydney Phillips January 13, 1936, MRN [id number]  PCP:  Sydney Morale, MD  Cardiologist:   Sydney Klein, MD   Chief Complaint  Patient presents with  . Irregular Heart Beat    ICD shock for ventricular tachycardia    History of Present Illness:  Sydney Phillips is a 84 y.o. female with cardiomyopathy here for CHF follow up, check on CRT-D device, Repeat CHF hospitalizations in November 2017, December 2017,January 2018, July 2018, February 2019.   07/08/2019 her ICD delivered appropriate 25J shock for polymorphic VT in VF zone, occurred during sleep (she was awakened by a "thump" in her chest). No recurrence since then.  For about a week she has not been taking her potassium chloride or magnesium supplements.  She usually mixes her potassium in applesauce, but her applesauce was mildly and she was unable to buy anymore.  She has NYHA functional class II-3 shortness of breath, not much different from her baseline.  She does not have much in the way lower extremity edema except towards the end of the day.  She does not have orthopnea (she habitually sleeps on 2 pillows) or PND.  It has been more than 2 years since her last hospitalization for heart failure.  Not had any angina pectoris and its been over 2 years since her last stent procedure.  She denies syncope or palpitations.  Interrogation of her device shows an episode of sustained VT on January 22.  On a background of frequent PVCs and repeated episodes of brief nonsustained VT she developed monomorphic VT with a cycle length of about 280 ms that appeared to deteriorate to a more polymorphic pattern.  Since this occurred in the VF zone ATP during charge was administered once.  That actually appeared to accelerate the arrhythmia to a cycle length of about 240 ms.  A single 25 J shock was successful in  converting her back to normal rhythm.  She has 95% biventricular pacing, 91% efficient.  She has not had atrial fibrillation and has rare episodes of atrial mode switch due to atrial tachycardia..  She rarely requires atrial pacing.  She has approximately 2 years left on the current battery.  Her OptiVol has been increased since mid December (this has been a very frequent occurrence for her, without they are always being a clear association with her clinical status). Lead parameters are normal.  LV lead pacing is LV tip to RV coil in the threshold is unchanged from previous testing.  ECG today shows a positive R wave in lead V1.  Her weight is very similar to her usual baseline of 188-190 pounds.   She has mixed ischemic and nonischemic CMP and has been CRT "hyper-responder". In 2009 her ejection fraction was 20%. After CRT, her LV improved and in 2013 EF was 50% and she had several years of relative cardiac stability. LVEF in September 2017 was 40-45%, then dropped to 25-30% in November 2017 when she had NSTEMI, in the setting of COPD exacerbation, respiratory failure, acute renal failure and syncope. Cardiac catheterization showed severe stenoses in the right coronary artery and left circumflex coronary artery both treated with drug-eluting stents. There was also moderate ostial stenosis of the LAD. Enrolled in Twilight study. Repeat echo in January 2018 showed EF 20-25% with a severely dilated left  ventricle, moderate mitral regurgitation, systolic PA pressure 53 mmHg. Was readmitted on May 30 2016 with altered mental status, suspicion of steroid induced psychosis and discharged to skilled nursing facility, but again seen in the emergency room on December 19 after a fall with head injury. Gradually improved mental status and felt to be back at baseline by early January 2018. After that hospitalization for diuretics were held for acute renal insufficiency and she was admitted on January 11 with acute  exacerbation of heart failure. Improved with diuretics and discharged on January 15 with a weight of 77.4 kg (171 lb).  February 2019, LVEF had partially rebounded to 35-40%.  Continues to require hospitalization for heart failure exacerbation on the average 2 or 3 times a year since then.  She has twin boys; Sydney Phillips has medical power of attorney.  Sydney Phillips accompanies her to the office today.  Past Medical History:  Diagnosis Date  . Anxiety   . Arthritis   . Asthma   . Automatic implantable cardioverter-defibrillator in situ   . Bronchitis   . CAD (coronary artery disease)    a. s/p DES to LCx/RCA 05/2016, ostial LAD disease.  . Cardiomyopathy ([place])   . Chronic back pain   . Chronic combined systolic and diastolic CHF (congestive heart failure) ([place])   . Chronic constipation   . Chronic pain   . CKD (chronic kidney disease), stage III   . Colon polyps 2003.  2015.   HP polyps 2003.  adnomas 2015.  required referal to [facility] for colonoscopic resection of flat polyps.   Marland Kitchen COPD (chronic obstructive pulmonary disease) ([place])   . Dementia ([place])   . Depression   . Depression with anxiety    takes Cymbalta daily  . Diabetes mellitus ([place])   . Dyspnea   . Early cataracts, bilateral   . Fibromyalgia   . GERD (gastroesophageal reflux disease)    was on meds but was taken off;now watches what she eats  . Hemorrhoids   . History of kidney stones   . Hx of colonic polyps   . Hyperlipemia    takes Crestor daily  . Hypertension    takes Amlodipine and Metoprolol daily  . Insomnia   . LBBB (left bundle branch block)    Stress test 09/03/2010, EF 55  . Myocardial infarction ([place])   . PAD (peripheral artery disease) (HCC)    Carotid, subclavian, and lower extremity beds, currently not symptomatic  . Presence of combination internal cardiac defibrillator (ICD) and pacemaker   . Presence of permanent cardiac pacemaker   . Pulmonary hypertension ([place])   . S/P angioplasty with stent, lt.  subclavian 07/31/11 08/01/2011  . Subclavian arterial stenosis, lt, with PTA/STENT 07/31/11 08/01/2011  . Syncope 07/28/2011   EF - 50-55, moderate concentric hypertrophy in left ventricle  . Urinary incontinence   . Vertigo    takes Meclizine prn    Past Surgical History:  Procedure Laterality Date  . ABDOMINAL AORTAGRAM N/A 08/12/2013   Procedure: ABDOMINAL Maxcine Ham;  Surgeon: Elam Dutch, MD;  Location: [facility] CATH LAB;  Service: Cardiovascular;  Laterality: N/A;  . ABDOMINAL HYSTERECTOMY    . APPENDECTOMY    . BACK SURGERY  2012  . BIV ICD GENERTAOR CHANGE OUT Left 02/20/2012   Procedure: BIV ICD GENERTAOR CHANGE OUT;  Surgeon: Sydney Klein, MD;  Location: [facility] CATH LAB;  Service: Cardiovascular;  Laterality: Left;  . CARDIAC CATHETERIZATION  12/01/2007   By Dr. Melvern Banker, left heart cath,   .  CARDIAC CATHETERIZATION N/A 05/20/2016   Procedure: Right/Left Heart Cath and Coronary Angiography;  Surgeon: Sherren Mocha, MD;  Location: [place] CV LAB;  Service: Cardiovascular;  Laterality: N/A;  . CARDIAC CATHETERIZATION N/A 05/20/2016   Procedure: Coronary Stent Intervention;  Surgeon: Sherren Mocha, MD;  Location: [place] CV LAB;  Service: Cardiovascular;  Laterality: N/A;  . CARDIAC DEFIBRILLATOR PLACEMENT  05/2008   By Dr Blanch Media, [employer] CANNOT HAVE MRI's  . CAROTID ANGIOGRAM N/A 07/31/2011   Procedure: CAROTID ANGIOGRAM;  Surgeon: Lorretta Harp, MD;  Location: [facility] CATH LAB;  Service: Cardiovascular;  Laterality: N/A;  carotid angiogram and possible Lt SCA PTA  . COLONOSCOPY W/ POLYPECTOMY  12/2013  . CORONARY ANGIOPLASTY    . ENDARTERECTOMY Left 11/08/2014   Procedure: LEFT CAROTID ENDARTERECTOMY WITH HEMASHIELD PATCH ANGIOPLASTY;  Surgeon: Elam Dutch, MD;  Location: [place];  Service: Vascular;  Laterality: Left;  . ESOPHAGOGASTRODUODENOSCOPY N/A 01/20/2014   Procedure: ESOPHAGOGASTRODUODENOSCOPY (EGD);  Surgeon: Jerene Bears, MD;  Location: [facility] ENDOSCOPY;  Service: Endoscopy;   Laterality: N/A;  . FEMORAL-POPLITEAL BYPASS GRAFT Right 10/12/2013   Procedure:   Femoral-Peroneal trunk  bypass with nonreversed greater saphenous vein graft;  Surgeon: Elam Dutch, MD;  Location: [place];  Service: Vascular;  Laterality: Right;  . GIVENS CAPSULE STUDY N/A 01/20/2014   Procedure: GIVENS CAPSULE STUDY;  Surgeon: Jerene Bears, MD;  Location: [place];  Service: Gastroenterology;  Laterality: N/A;  . ICD GENERATOR CHANGEOUT N/A 03/04/2017   Procedure: ICD Generator Changeout;  Surgeon: Sydney Klein, MD;  Location: [place] CV LAB;  Service: Cardiovascular;  Laterality: N/A;  . INSERT / REPLACE / REMOVE PACEMAKER    . INTRAOPERATIVE ARTERIOGRAM Right 10/12/2013   Procedure: INTRA OPERATIVE ARTERIOGRAM;  Surgeon: Elam Dutch, MD;  Location: [place];  Service: Vascular;  Laterality: Right;  . ORIF ELBOW FRACTURE  08/16/2011   Procedure: OPEN REDUCTION INTERNAL FIXATION (ORIF) ELBOW/OLECRANON FRACTURE;  Surgeon: Schuyler Amor, MD;  Location: [place];  Service: Orthopedics;  Laterality: Left;  . RENAL ANGIOGRAM N/A 08/12/2013   Procedure: RENAL ANGIOGRAM;  Surgeon: Elam Dutch, MD;  Location: [facility] CATH LAB;  Service: Cardiovascular;  Laterality: N/A;  . SUBCLAVIAN STENT PLACEMENT Left 07/31/2011   7x18 Genesis, balloon, with reduction of 90% ostial left subclavian artery stenosis to 0% with residual excellent flow  . TONSILLECTOMY    . TUBAL LIGATION      Current Medications: Outpatient Medications Prior to Visit  Medication Sig Dispense Refill  . ACCU-CHEK FASTCLIX LANCETS MISC USE   TO CHECK GLUCOSE ONCE DAILY 102 each 5  . ACCU-CHEK SMARTVIEW test strip USE  STRIP TO CHECK GLUCOSE ONCE DAILY 100 each 0  . acetaminophen (TYLENOL) 500 MG tablet Take 1 tablet (500 mg total) by mouth 2 (two) times daily. 30 tablet 0  . albuterol (PROVENTIL) (2.5 MG/3ML) 0.083% nebulizer solution USE 1 VIAL IN NEBULIZER EVERY 4 HOURS AS NEEDED FOR WHEEZING FOR SHORTNESS OF BREATH 225  mL 0  . allopurinol (ZYLOPRIM) 300 MG tablet Take 1 tablet by mouth once daily (Patient not taking: Reported on 07/13/2019) 90 tablet 3  . allopurinol (ZYLOPRIM) 300 MG tablet Take 1 tablet (300 mg total) by mouth daily. 90 tablet 3  . ALPRAZolam (XANAX) 1 MG tablet TAKE 1 TABLET BY MOUTH IN THE MORNING AND TAKE 1 & 1/2 (ONE & ONE-HALF) AT NIGHT AS NEEDED FOR ANXIETY (Patient not taking: Reported on 07/13/2019) 75 tablet 5  . ALPRAZolam Duanne Moron)  1 MG tablet TAKE 1 TABLET BY MOUTH IN THE MORNING AND 1 & 1/2 (ONE & ONE-HALF) NIGHTLY AS NEEDED FOR ANXIETY 75 tablet 5  . aspirin 81 MG chewable tablet Chew 1 tablet (81 mg total) by mouth daily. 30 tablet 1  . azelastine (ASTELIN) 0.1 % nasal spray USE 2 SPRAY(S) IN EACH NOSTRIL TWICE DAILY AS DIRECTED 30 mL 0  . bisoprolol (ZEBETA) 5 MG tablet Take 1 tablet by mouth once daily 90 tablet 3  . cholecalciferol (VITAMIN D) 1000 units tablet Take 1,000 Units by mouth daily.    . clopidogrel (PLAVIX) 75 MG tablet Take 1 tablet by mouth once daily 90 tablet 3  . cyanocobalamin 1000 MCG tablet Take 1,000 mcg by mouth daily.    . DULoxetine (CYMBALTA) 60 MG capsule Take 1 capsule (60 mg total) by mouth 2 (two) times daily. 270 capsule 3  . furosemide (LASIX) 80 MG tablet TAKE 2 TABLETS BY MOUTH IN THE MORNING AND 2 IN THE EVENING 120 tablet 11  . HYDROcodone-acetaminophen (NORCO) 10-325 MG tablet Take 1 tablet by mouth every 4 (four) hours as needed for moderate pain. 30 tablet 0  . Magnesium Oxide 400 MG CAPS Take 1 capsule (400 mg total) by mouth 2 (two) times daily. 180 capsule 3  . metFORMIN (GLUCOPHAGE) 500 MG tablet Take 1 tablet (500 mg total) by mouth 2 (two) times daily with a meal. 180 tablet 3  . nitroGLYCERIN (NITROSTAT) 0.4 MG SL tablet Place 0.4 mg under the tongue every 5 (five) minutes as needed for chest pain. X 3 doses    . potassium chloride SA (K-DUR) 20 MEQ tablet Take 2 tablets (40 mEq total) by mouth 2 (two) times daily. 360 tablet 3  .  PROAIR HFA 108 (90 Base) MCG/ACT inhaler INHALE 2 PUFFS INTO LUNGS EVERY 4 HOURS AS NEEDED FOR WHEEZING AND FOR SHORTNESS OF BREATH 27 g 0  . traZODone (DESYREL) 50 MG tablet Take 1 tablet (50 mg total) by mouth at bedtime. (Patient taking differently: Take 50 mg by mouth at bedtime as needed for sleep. ) 90 tablet 3   No facility-administered medications prior to visit.     Allergies:   Potassium-containing compounds, Neomycin-polymyxin b gu, Other, Azithromycin, Codeine, Darvon, Erythromycin, Meloxicam, Norco [hydrocodone-acetaminophen], Penicillins, Propoxyphene n-acetaminophen, Rofecoxib, Rosuvastatin, Statins, and Sulfa antibiotics   Social History   Socioeconomic History  . Marital status: Widowed    Spouse name: Not on file  . Number of children: 3  . Years of education: 96  . Highest education level: Not on file  Occupational History  . Occupation: Retired  Tobacco Use  . Smoking status: Former Smoker    Packs/day: 0.50    Years: 62.00    Pack years: 31.00    Types: Cigarettes    Quit date: 07/19/2017    Years since quitting: 1.9  . Smokeless tobacco: Never Used  Substance and Sexual Activity  . Alcohol use: No    Alcohol/week: 0.0 standard drinks  . Drug use: No  . Sexual activity: Never    Birth control/protection: Surgical  Other Topics Concern  . Not on file  Social History Narrative   Ok to share information with medical POA, Son Gabriel Carina   Right-handed   Caffeine: Pepsi   Social Determinants of Health   Financial Resource Strain:   . Difficulty of Paying Living Expenses: Not on file  Food Insecurity:   . Worried About Charity fundraiser in the Last Year:  Not on file  . Ran Out of Food in the Last Year: Not on file  Transportation Needs:   . Lack of Transportation (Medical): Not on file  . Lack of Transportation (Non-Medical): Not on file  Physical Activity:   . Days of Exercise per Week: Not on file  . Minutes of Exercise per Session: Not on file    Stress:   . Feeling of Stress : Not on file  Social Connections:   . Frequency of Communication with Friends and Family: Not on file  . Frequency of Social Gatherings with Friends and Family: Not on file  . Attends Religious Services: Not on file  . Active Member of Clubs or Organizations: Not on file  . Attends Archivist Meetings: Not on file  . Marital Status: Not on file     Family History:  The patient's family history includes CAD in her father; Cancer in her sister; Colon cancer in her maternal grandmother; Deep vein thrombosis in her son; Diabetes in her sister; Heart disease in her father, mother, and sister; Hyperlipidemia in her father and another family member.   ROS:   Please see the history of present illness.    ROS all other systems are reviewed and are negative  PHYSICAL EXAM:   VS:  BP (!) 142/68   Pulse 82   Ht 5\' 5"  (1.651 m)   Wt 188 lb (85.3 kg)   BMI 31.28 kg/m       General: Alert, oriented x3, no distress, mildly obese Head: no evidence of trauma, PERRL, EOMI, no exophtalmos or lid lag, no myxedema, no xanthelasma; normal ears, nose and oropharynx Neck: 6-7 cm elevation jugular venous pulsations and no hepatojugular reflux; brisk carotid pulses without delay and no carotid bruits Chest: clear to auscultation, no signs of consolidation by percussion or palpation, normal fremitus, symmetrical and full respiratory excursions Cardiovascular: normal position and quality of the apical impulse, regular rhythm, normal first and second heart sounds, no murmurs, rubs or gallops Abdomen: no tenderness or distention, no masses by palpation, no abnormal pulsatility or arterial bruits, normal bowel sounds, no hepatosplenomegaly Extremities: no clubbing, cyanosis; trace bilateral ankle edema; 2+ radial, ulnar and brachial pulses bilaterally; 2+ right femoral, posterior tibial and dorsalis pedis pulses; 2+ left femoral, posterior tibial and dorsalis pedis pulses;  no subclavian or femoral bruits Neurological: grossly nonfocal Psych: Normal mood and affect     Studies/Labs Reviewed:   CARDIAC CATH 2017: 1. Severe 2 vessel CAD of the RCA and LCx, treated successfully with drug-eluting stents in each vessel 2. Moderate ostial LAD stenosis 3. Congestive heart failure with elevated PCWP/LVEDP 4. Moderate pulmonary HTN likely related to left heart disease (PVR 2 [place] units)  DAPT with ASA and Brilinta 12 months - consider Twilight Clinical Trial. Gentle fluids x 4 hours, likely further diuresis tomorrow if renal function stable.  ECHO 07/20/2017: - Left ventricle: The cavity size was mildly dilated. Wall   thickness was increased in a pattern of mild LVH. Systolic   function was moderately reduced. The estimated ejection fraction   was in the range of 35% to 40%. Diffuse hypokinesis. Features are   consistent with a pseudonormal left ventricular filling pattern,   with concomitant abnormal relaxation and increased filling   pressure (grade 2 diastolic dysfunction). - Aortic valve: Mildly to moderately calcified annulus. - Mitral valve: There was moderate regurgitation. Valve area by   continuity equation (using LVOT flow): 1.82 cm^2. - Left atrium: The  atrium was mildly dilated. - Pulmonary arteries: Systolic pressure was moderately increased.   PA peak pressure: 48 mm Hg (S). EKG:  EKG is ordered today and shows atrial sensed (sinus), biventricular paced rhythm with a small R wave in lead V1.  The QRS is 146 ms.  QTc 558 ms.  Recent Labs: 03/09/2019: Hemoglobin 13.3; Platelets 253.0 07/13/2019: BNP 736.1; BUN 16; Creatinine, Ser 1.27; Magnesium 1.9; Potassium 3.7; Sodium 145   Lipid Panel    Component Value Date/Time   CHOL 183 07/02/2017 1120   TRIG 296.0 (H) 07/02/2017 1120   HDL 31.50 (L) 07/02/2017 1120   CHOLHDL 6 07/02/2017 1120   VLDL 59.2 (H) 07/02/2017 1120   LDLCALC 140 (H) 12/19/2016 0736   LDLDIRECT 115.0 07/02/2017 1120      ASSESSMENT:    1. VT (ventricular tachycardia) (HCC)   2. Chronic combined systolic (congestive) and diastolic (congestive) heart failure ([place])   3. Biventricular ICD (implantable cardioverter-defibrillator) in place   4. Coronary artery disease involving native coronary artery of native heart without angina pectoris   5. PAD (peripheral artery disease) ([place])   6. Hypercholesterolemia   7. Pulmonary emphysema, unspecified emphysema type ([place])      PLAN:  In order of problems listed above:  1. VT: Appropriately unsuccessful defibrillation with a single shock from her ICD.  Etiology might have been noncompliance with potassium supplement and we checked her labs today.  If the electrolytes are normal should reevaluate her echocardiogram and look for ischemia with a Lexiscan Myoview.  Refer to electrophysiology after we have these results.  She has not had angina.  She reports compliance with her beta-blockers (she is on bisoprolol due to reactive airway disease).   2. CHF: Clinically she has only mild signs of hypervolemia, although her OptiVol suggests marked fluid overload (this has not always been a reliable correlate with her clinical status)... Most recent EF around 35-40% on echo from February 2019.  She is taking a highly selective beta blocker.  She is not on RAA S inhibitors due to volatile renal function and the tendency to orthostatic hypotension and falls.  Recheck echo if labs do not explain the recent arrhythmic event. 3. CRT-D: Normal device function.  She has effective BiV pacing greater than 90% of the time.  Relatively high pacing threshold recorded in the coronary sinus lead.  She is undergoing monthly OptiVol downloads. Continue remote downloads every 3 months and office visit at least every 6 months.  She has monthly downloads to the heart failure device clinic. 4. CAD s/p DES to RCA and LCX Dec 2017: She is currently asymptomatic and free of angina.  She is on long-term  dual antiplatelet therapy due to high risk anatomy.  She does have a remote history of GI bleeding, but no recent problems. 5. PAD: Currently asymptomatic.  She is very sedentary.  She denies claudication in either the upper or lower extremities.  History of left subclavian stent, should monitor blood pressure always in the right arm. History of left carotid endarterectomy, followed by Dr. Oneida Alar. Asymptomatic decrease in left lower extremity ABI. 6. HLP: No longer on statin (muscle cramps).  I have been unable to convince her that she needs to be on alternative lipid-lowering therapy such as Repatha. 7. COPD: Quit smoking February 2019.   Medication Adjustments/Labs and Tests Ordered: Current medicines are reviewed at length with the patient today.  Concerns regarding medicines are outlined above.  Medication changes, Labs and Tests ordered today  are listed in the Patient Instructions below. Patient Instructions  Medication Instructions:  No changes *If you need a refill on your cardiac medications before your next appointment, please call your pharmacy*  Lab Work:  Your provider would like for you to have the following labs today: BMET, Magnesium, and BNP.  If you have labs (blood work) drawn today and your tests are completely normal, you will receive your results only by: Marland Kitchen MyChart Message (if you have MyChart) OR . A paper copy in the mail If you have any lab test that is abnormal or we need to change your treatment, we will call you to review the results.  Testing/Procedures: None ordered   Follow-Up: At [facility], you and your health needs are our priority.  As part of our continuing mission to provide you with exceptional heart care, we have created designated Provider Care Teams.  These Care Teams include your primary Cardiologist (physician) and Advanced Practice Providers (APPs -  Physician Assistants and Nurse Practitioners) who all work together to provide you with the  care you need, when you need it.  Your next appointment:   3 month(s) on a pacer day  The format for your next appointment:   In Person  Provider:   [name], MD  Special Instructions: A referral has been placed for an electrophysiologist.    Signed, Sydney Klein, MD  07/14/2019 4:27 PM    [address], [place], [place]  [address] Phone: [phone]; Fax: [phone]

## 2019-07-13 NOTE — Patient Instructions (Addendum)
Medication Instructions:  No changes *If you need a refill on your cardiac medications before your next appointment, please call your pharmacy*  Lab Work:  Your provider would like for you to have the following labs today: BMET, Magnesium, and BNP.  If you have labs (blood work) drawn today and your tests are completely normal, you will receive your results only by: Marland Kitchen MyChart Message (if you have MyChart) OR . A paper copy in the mail If you have any lab test that is abnormal or we need to change your treatment, we will call you to review the results.  Testing/Procedures: None ordered   Follow-Up: At [facility], you and your health needs are our priority.  As part of our continuing mission to provide you with exceptional heart care, we have created designated Provider Care Teams.  These Care Teams include your primary Cardiologist (physician) and Advanced Practice Providers (APPs -  Physician Assistants and Nurse Practitioners) who all work together to provide you with the care you need, when you need it.  Your next appointment:   3 month(s) on a pacer day  The format for your next appointment:   In Person  Provider:   [name], MD  Special Instructions: A referral has been placed for an electrophysiologist.

## 2019-07-14 ENCOUNTER — Encounter: Payer: Self-pay | Admitting: Cardiovascular Disease

## 2019-07-14 ENCOUNTER — Other Ambulatory Visit: Payer: Self-pay | Admitting: *Deleted

## 2019-07-14 DIAGNOSIS — I472 Ventricular tachycardia, unspecified: Secondary | ICD-10-CM

## 2019-07-14 DIAGNOSIS — I5042 Chronic combined systolic (congestive) and diastolic (congestive) heart failure: Secondary | ICD-10-CM

## 2019-07-14 LAB — BASIC METABOLIC PANEL
BUN/Creatinine Ratio: 13 (ref 12–28)
BUN: 16 mg/dL (ref 8–27)
CO2: 34 mmol/L — ABNORMAL HIGH (ref 20–29)
Calcium: 9.7 mg/dL (ref 8.7–10.3)
Chloride: 97 mmol/L (ref 96–106)
Creatinine, Ser: 1.27 mg/dL — ABNORMAL HIGH (ref 0.57–1.00)
GFR calc Af Amer: 46 mL/min/{1.73_m2} — ABNORMAL LOW (ref >59)
GFR calc non Af Amer: 40 mL/min/{1.73_m2} — ABNORMAL LOW (ref >59)
Glucose: 117 mg/dL — ABNORMAL HIGH (ref 65–99)
Potassium: 3.7 mmol/L (ref 3.5–5.2)
Sodium: 145 mmol/L — ABNORMAL HIGH (ref 134–144)

## 2019-07-14 LAB — BRAIN NATRIURETIC PEPTIDE: BNP: 736.1 pg/mL — ABNORMAL HIGH (ref 0.0–100.0)

## 2019-07-14 LAB — MAGNESIUM: Magnesium: 1.9 mg/dL (ref 1.6–2.3)

## 2019-07-15 ENCOUNTER — Telehealth: Payer: Self-pay | Admitting: *Deleted

## 2019-07-15 MED ORDER — MAGNESIUM OXIDE 400 MG PO CAPS: 400.0000 mg | ORAL_CAPSULE | Freq: Every day | ORAL | 3 refills | Status: DC

## 2019-07-15 NOTE — Telephone Encounter (Signed)
-----   Message from [name], MD sent at 07/14/2019 11:29 AM EST ----- Mag level normal, Potassium low normal. Needs to restart the potassium supplement as we discussed, but please take the Mag Oxide only ONCE daily. The labs do not explain the recent VT event. Please go ahead with echo and Myoview as we discussed.

## 2019-07-15 NOTE — Telephone Encounter (Signed)
Patient made aware of results and verbalized understanding.  Orders have been placed. She has been advised to call if she has any further questions about the results and medications.

## 2019-07-19 ENCOUNTER — Other Ambulatory Visit: Payer: Self-pay | Admitting: Cardiovascular Disease

## 2019-07-27 ENCOUNTER — Inpatient Hospital Stay (HOSPITAL_COMMUNITY): Admission: RE | Admit: 2019-07-27 | Payer: Medicare Other | Source: Ambulatory Visit

## 2019-08-03 NOTE — Progress Notes (Signed)
No ICM remote transmission received for 07/25/2019 and next ICM transmission scheduled for 08/11/2019.

## 2019-08-05 ENCOUNTER — Telehealth (HOSPITAL_COMMUNITY): Payer: Self-pay | Admitting: *Deleted

## 2019-08-05 NOTE — Telephone Encounter (Signed)
Close encounter 

## 2019-08-06 ENCOUNTER — Other Ambulatory Visit: Payer: Self-pay | Admitting: Family Medicine

## 2019-08-08 ENCOUNTER — Other Ambulatory Visit (HOSPITAL_COMMUNITY): Payer: Medicare Other

## 2019-08-09 ENCOUNTER — Ambulatory Visit (HOSPITAL_COMMUNITY)
Admission: RE | Admit: 2019-08-09 | Payer: Medicare Other | Source: Ambulatory Visit | Attending: Cardiovascular Disease | Admitting: Cardiovascular Disease

## 2019-08-09 ENCOUNTER — Other Ambulatory Visit (HOSPITAL_COMMUNITY): Payer: Medicare Other

## 2019-08-10 ENCOUNTER — Ambulatory Visit (INDEPENDENT_AMBULATORY_CARE_PROVIDER_SITE_OTHER): Payer: Medicare Other | Admitting: *Deleted

## 2019-08-10 DIAGNOSIS — Z95810 Presence of automatic (implantable) cardiac defibrillator: Secondary | ICD-10-CM

## 2019-08-10 LAB — CUP PACEART REMOTE DEVICE CHECK
Battery Remaining Longevity: 23 mo
Battery Voltage: 2.94 V
Brady Statistic AP VP Percent: 2.99 %
Brady Statistic AP VS Percent: 0.05 %
Brady Statistic AS VP Percent: 95.57 %
Brady Statistic AS VS Percent: 1.38 %
Brady Statistic RA Percent Paced: 3.01 %
Brady Statistic RV Percent Paced: 95.54 %
Date Time Interrogation Session: 20210224002204
HighPow Impedance: 60 Ohm
HighPow Impedance: 75 Ohm
Implantable Lead Implant Date: 20091202
Implantable Lead Implant Date: 20091202
Implantable Lead Implant Date: 20091202
Implantable Lead Location: 753858
Implantable Lead Location: 753859
Implantable Lead Location: 753860
Implantable Lead Model: 185
Implantable Lead Model: 4194
Implantable Lead Model: 4470
Implantable Lead Serial Number: 308680
Implantable Lead Serial Number: 635533
Implantable Pulse Generator Implant Date: 20180919
Lead Channel Impedance Value: 304 Ohm
Lead Channel Impedance Value: 456 Ohm
Lead Channel Impedance Value: 475 Ohm
Lead Channel Impedance Value: 551 Ohm
Lead Channel Impedance Value: 760 Ohm
Lead Channel Impedance Value: 760 Ohm
Lead Channel Pacing Threshold Amplitude: 0.5 V
Lead Channel Pacing Threshold Amplitude: 0.5 V
Lead Channel Pacing Threshold Amplitude: 2.125 V
Lead Channel Pacing Threshold Pulse Width: 0.4 ms
Lead Channel Pacing Threshold Pulse Width: 0.4 ms
Lead Channel Pacing Threshold Pulse Width: 1 ms
Lead Channel Sensing Intrinsic Amplitude: 28 mV
Lead Channel Sensing Intrinsic Amplitude: 28 mV
Lead Channel Sensing Intrinsic Amplitude: 3.875 mV
Lead Channel Sensing Intrinsic Amplitude: 3.875 mV
Lead Channel Setting Pacing Amplitude: 1.5 V
Lead Channel Setting Pacing Amplitude: 2 V
Lead Channel Setting Pacing Amplitude: 3.5 V
Lead Channel Setting Pacing Pulse Width: 0.4 ms
Lead Channel Setting Pacing Pulse Width: 1 ms
Lead Channel Setting Sensing Sensitivity: 0.3 mV

## 2019-08-11 ENCOUNTER — Ambulatory Visit (INDEPENDENT_AMBULATORY_CARE_PROVIDER_SITE_OTHER): Payer: Medicare Other

## 2019-08-11 DIAGNOSIS — I5042 Chronic combined systolic (congestive) and diastolic (congestive) heart failure: Secondary | ICD-10-CM

## 2019-08-11 DIAGNOSIS — Z95810 Presence of automatic (implantable) cardiac defibrillator: Secondary | ICD-10-CM

## 2019-08-11 NOTE — Progress Notes (Signed)
ICD Remote  

## 2019-08-13 DIAGNOSIS — M5127 Other intervertebral disc displacement, lumbosacral region: Secondary | ICD-10-CM | POA: Diagnosis not present

## 2019-08-13 DIAGNOSIS — J439 Emphysema, unspecified: Secondary | ICD-10-CM | POA: Diagnosis not present

## 2019-08-13 DIAGNOSIS — J449 Chronic obstructive pulmonary disease, unspecified: Secondary | ICD-10-CM | POA: Diagnosis not present

## 2019-08-15 NOTE — Progress Notes (Signed)
EPIC Encounter for ICM Monitoring  Patient Name: Sydney Phillips is a 84 y.o. female Date: 08/15/2019 Primary Care Physican: Laurey Morale, MD Primary Cardiologist:Croitoru Electrophysiologist: Croitoru BiV Pacing: 95.6% (Effective90.1%) LastWeight:171lbs   Transmission reviewed.  OptivolThoracic impedancenormal.  Prescribed:Furosemide 80 mg2 tablets(160 mg total)by mouthtwice a day(prescribed by Dr Sharlene Motts).Potassium 20 mEq 2tablets (40 mEq total)twice a day.  Labs: 03/09/2019 Creatinine 1.10, BUN 21, Potassium 4.3, Sodium 141, GFR 47.66 A complete set of results can be found in Results Review  Recommendations: Nonw  Follow-up plan: ICM clinic phone appointment on 09/12/2019.   91 day device clinic remote transmission 11/09/2019.    Copy of ICM check sent to Dr. Sallyanne Kuster.   3 month ICM trend: 08/10/2019    1 Year ICM trend:       [name], RN 08/15/2019 9:53 AM

## 2019-08-17 ENCOUNTER — Telehealth (HOSPITAL_COMMUNITY): Payer: Self-pay | Admitting: *Deleted

## 2019-08-17 NOTE — Telephone Encounter (Signed)
Left message on voicemail per DPR in reference to upcoming appointment scheduled on 08/24/2019 at 0800 with detailed instructions given per Myocardial Perfusion Study Information Sheet for the test. LM to arrive 15 minutes early, and that it is imperative to arrive on time for appointment to keep from having the test rescheduled. If you need to cancel or reschedule your appointment, please call the office within 24 hours of your appointment. Failure to do so may result in a cancellation of your appointment, and a $50 no show fee. Phone number given for call back for any questions. No my chart available Fount Bahe, Ranae Palms

## 2019-08-24 ENCOUNTER — Telehealth: Payer: Self-pay | Admitting: Cardiovascular Disease

## 2019-08-24 ENCOUNTER — Ambulatory Visit (HOSPITAL_BASED_OUTPATIENT_CLINIC_OR_DEPARTMENT_OTHER): Payer: Medicare Other

## 2019-08-24 ENCOUNTER — Ambulatory Visit (HOSPITAL_COMMUNITY): Payer: Medicare Other | Attending: Cardiovascular Disease

## 2019-08-24 ENCOUNTER — Other Ambulatory Visit: Payer: Self-pay

## 2019-08-24 DIAGNOSIS — I472 Ventricular tachycardia, unspecified: Secondary | ICD-10-CM

## 2019-08-24 DIAGNOSIS — I5042 Chronic combined systolic (congestive) and diastolic (congestive) heart failure: Secondary | ICD-10-CM

## 2019-08-24 DIAGNOSIS — R6889 Other general symptoms and signs: Secondary | ICD-10-CM | POA: Diagnosis not present

## 2019-08-24 LAB — MYOCARDIAL PERFUSION IMAGING
LV dias vol: 214 mL (ref 46–106)
LV sys vol: 170 mL
Peak HR: 90 {beats}/min
Rest HR: 83 {beats}/min
SDS: 3
SRS: 0
SSS: 3
TID: 1

## 2019-08-24 MED ORDER — TECHNETIUM TC 99M TETROFOSMIN IV KIT
10.7000 | PACK | Freq: Once | INTRAVENOUS | Status: AC | PRN
  Administered 2019-08-24: 10.7 via INTRAVENOUS
  Filled 2019-08-24: qty 11

## 2019-08-24 MED ORDER — TECHNETIUM TC 99M TETROFOSMIN IV KIT
32.2000 | PACK | Freq: Once | INTRAVENOUS | Status: AC | PRN
  Administered 2019-08-24: 32.2 via INTRAVENOUS
  Filled 2019-08-24: qty 33

## 2019-08-24 MED ORDER — PERFLUTREN LIPID MICROSPHERE
1.0000 mL | INTRAVENOUS | Status: AC | PRN
  Administered 2019-08-24: 1 mL via INTRAVENOUS

## 2019-08-24 MED ORDER — REGADENOSON 0.4 MG/5ML IV SOLN
0.4000 mg | Freq: Once | INTRAVENOUS | Status: AC
  Administered 2019-08-24: 0.4 mg via INTRAVENOUS

## 2019-08-24 NOTE — Telephone Encounter (Signed)
Patient made aware of results and verbalized understanding. Virtual appointment made for 3/30 with Dr. Sallyanne Kuster.

## 2019-08-24 NOTE — Telephone Encounter (Signed)
Patient returning Lisa's call in regards to echo results.

## 2019-08-25 NOTE — Telephone Encounter (Signed)
Follow up  Pt is calling back, She said Sydney Phillips told her to callback today  Please call

## 2019-08-26 NOTE — Telephone Encounter (Signed)
Left a message for the patient to call back if she needed anything. We did not need her to call back unless she has any questions and needs something.

## 2019-09-10 DIAGNOSIS — J439 Emphysema, unspecified: Secondary | ICD-10-CM | POA: Diagnosis not present

## 2019-09-10 DIAGNOSIS — J449 Chronic obstructive pulmonary disease, unspecified: Secondary | ICD-10-CM | POA: Diagnosis not present

## 2019-09-10 DIAGNOSIS — M5127 Other intervertebral disc displacement, lumbosacral region: Secondary | ICD-10-CM | POA: Diagnosis not present

## 2019-09-13 ENCOUNTER — Telehealth (INDEPENDENT_AMBULATORY_CARE_PROVIDER_SITE_OTHER): Payer: Medicare Other | Admitting: Cardiovascular Disease

## 2019-09-13 ENCOUNTER — Encounter: Payer: Self-pay | Admitting: Cardiovascular Disease

## 2019-09-13 VITALS — BP 110/57 | HR 79 | Ht 65.0 in | Wt 177.0 lb

## 2019-09-13 DIAGNOSIS — E785 Hyperlipidemia, unspecified: Secondary | ICD-10-CM | POA: Diagnosis not present

## 2019-09-13 DIAGNOSIS — Z7189 Other specified counseling: Secondary | ICD-10-CM

## 2019-09-13 DIAGNOSIS — I4729 Other ventricular tachycardia: Secondary | ICD-10-CM

## 2019-09-13 DIAGNOSIS — Z955 Presence of coronary angioplasty implant and graft: Secondary | ICD-10-CM

## 2019-09-13 DIAGNOSIS — Z87891 Personal history of nicotine dependence: Secondary | ICD-10-CM

## 2019-09-13 DIAGNOSIS — I5042 Chronic combined systolic (congestive) and diastolic (congestive) heart failure: Secondary | ICD-10-CM

## 2019-09-13 DIAGNOSIS — I2510 Atherosclerotic heart disease of native coronary artery without angina pectoris: Secondary | ICD-10-CM | POA: Diagnosis not present

## 2019-09-13 DIAGNOSIS — I509 Heart failure, unspecified: Secondary | ICD-10-CM | POA: Diagnosis not present

## 2019-09-13 DIAGNOSIS — I472 Ventricular tachycardia: Secondary | ICD-10-CM | POA: Diagnosis not present

## 2019-09-13 DIAGNOSIS — I739 Peripheral vascular disease, unspecified: Secondary | ICD-10-CM

## 2019-09-13 DIAGNOSIS — Z95810 Presence of automatic (implantable) cardiac defibrillator: Secondary | ICD-10-CM

## 2019-09-13 DIAGNOSIS — J449 Chronic obstructive pulmonary disease, unspecified: Secondary | ICD-10-CM

## 2019-09-13 DIAGNOSIS — Z9981 Dependence on supplemental oxygen: Secondary | ICD-10-CM

## 2019-09-13 DIAGNOSIS — Z8249 Family history of ischemic heart disease and other diseases of the circulatory system: Secondary | ICD-10-CM

## 2019-09-13 DIAGNOSIS — Z950 Presence of cardiac pacemaker: Secondary | ICD-10-CM

## 2019-09-13 NOTE — Patient Instructions (Signed)

## 2019-09-15 NOTE — Progress Notes (Signed)
Virtual Visit via Video Note   This visit type was conducted due to national recommendations for restrictions regarding the COVID-19 Pandemic (e.g. social distancing) in an effort to limit this patient's exposure and mitigate transmission in our community.  Due to her co-morbid illnesses, this patient is at least at moderate risk for complications without adequate follow up.  This format is felt to be most appropriate for this patient at this time.  All issues noted in this document were discussed and addressed.  A limited physical exam was performed with this format.  Please refer to the patient's chart for her consent to telehealth for [facility].   The patient was identified using 2 identifiers.  Date:  09/15/2019   ID:  Sydney Phillips, DOB 1935/09/06, MRN [id number]  Patient Location: Home Provider Location: Home  PCP:  Laurey Morale, MD  Cardiologist:  [name], MD  Electrophysiologist:  None   Evaluation Performed:  Follow-Up Visit  Chief Complaint:  VT, CHF, CAD  History of Present Illness:    Sydney Phillips is a 84 y.o. female with severe combined ischemic and nonischemic cardiomyopathy, chronic systolic and diastolic heart failure, CAD, recent appropriate defibrillator discharge for polymorphic ventricular tachycardia in January 2021, diabetes mellitus, hypertension with history of severe orthostatic hypotension, mixed hyperlipidemia.  She has a dual-chamber CRT-D device [facility]).  Her episode of VT occurred on July 08, 2019, during a period of noncompliance with potassium and magnesium supplements, but labs actually showed normal electrolyte levels.  Her thoracic impedance (OptiVol) during that same period of time did show evidence of fluid overload, subsequently resolved.  Echocardiography on August 24, 2019 showed severely depressed left ventricular systolic function with EF of 20-25% and global hypokinesis, mildly dilated left ventricle, evidence of  increased filling pressures (pseudonormal mitral inflow).  She has moderate MR and mild to moderate TR.  The nuclear stress test performed on the same date showed a small fixed apical perfusion abnormality, but did not show any areas of reversible ischemia and confirmed EF 21%.  She denies angina pectoris and is doing well from a breathing point of view.  She does not have orthopnea or PND.  She denies leg edema.  She reports that her weight is actually gone down about 10 pounds since her last appointment.  She does use oxygen 2 L at night only.  Her last device download from February 24 shows roughly 95% atrial sensed, biventricular paced rhythm.  Effective BiV pacing is on the average 90%.  She has had a handful of episodes of tachycardia with 1: 1 AV association since her VT shock.  All of these have been very brief.  None of them have the appearance of polymorphic VT.  The patient does not have symptoms concerning for COVID-19 infection (fever, chills, cough, or new shortness of breath).    Past Medical History:  Diagnosis Date  . Anxiety   . Arthritis   . Asthma   . Automatic implantable cardioverter-defibrillator in situ   . Bronchitis   . CAD (coronary artery disease)    a. s/p DES to LCx/RCA 05/2016, ostial LAD disease.  . Cardiomyopathy ([place])   . Chronic back pain   . Chronic combined systolic and diastolic CHF (congestive heart failure) ([place])   . Chronic constipation   . Chronic pain   . CKD (chronic kidney disease), stage III   . Colon polyps 2003.  2015.   HP polyps 2003.  adnomas 2015.  required referal to [facility] for colonoscopic resection of flat polyps.   Marland Kitchen COPD (chronic obstructive pulmonary disease) ([place])   . Dementia ([place])   . Depression   . Depression with anxiety    takes Cymbalta daily  . Diabetes mellitus ([place])   . Dyspnea   . Early cataracts, bilateral   . Fibromyalgia   . GERD (gastroesophageal reflux disease)    was on meds but was taken off;now watches  what she eats  . Hemorrhoids   . History of kidney stones   . Hx of colonic polyps   . Hyperlipemia    takes Crestor daily  . Hypertension    takes Amlodipine and Metoprolol daily  . Insomnia   . LBBB (left bundle branch block)    Stress test 09/03/2010, EF 55  . Myocardial infarction ([place])   . PAD (peripheral artery disease) ([facility])    Carotid, subclavian, and lower extremity beds, currently not symptomatic  . Presence of combination internal cardiac defibrillator (ICD) and pacemaker   . Presence of permanent cardiac pacemaker   . Pulmonary hypertension ([place])   . S/P angioplasty with stent, lt. subclavian 07/31/11 08/01/2011  . Subclavian arterial stenosis, lt, with PTA/STENT 07/31/11 08/01/2011  . Syncope 07/28/2011   EF - 50-55, moderate concentric hypertrophy in left ventricle  . Urinary incontinence   . Vertigo    takes Meclizine prn   Past Surgical History:  Procedure Laterality Date  . ABDOMINAL AORTAGRAM N/A 08/12/2013   Procedure: ABDOMINAL Maxcine Ham;  Surgeon: Elam Dutch, MD;  Location: [facility] CATH LAB;  Service: Cardiovascular;  Laterality: N/A;  . ABDOMINAL HYSTERECTOMY    . APPENDECTOMY    . BACK SURGERY  2012  . BIV ICD GENERTAOR CHANGE OUT Left 02/20/2012   Procedure: BIV ICD GENERTAOR CHANGE OUT;  Surgeon: [name], MD;  Location: [facility] CATH LAB;  Service: Cardiovascular;  Laterality: Left;  . CARDIAC CATHETERIZATION  12/01/2007   By Dr. Melvern Banker, left heart cath,   . CARDIAC CATHETERIZATION N/A 05/20/2016   Procedure: Right/Left Heart Cath and Coronary Angiography;  Surgeon: Sherren Mocha, MD;  Location: [place] CV LAB;  Service: Cardiovascular;  Laterality: N/A;  . CARDIAC CATHETERIZATION N/A 05/20/2016   Procedure: Coronary Stent Intervention;  Surgeon: Sherren Mocha, MD;  Location: [place] CV LAB;  Service: Cardiovascular;  Laterality: N/A;  . CARDIAC DEFIBRILLATOR PLACEMENT  05/2008   By Dr Blanch Media, [employer] CANNOT HAVE MRI's  . CAROTID ANGIOGRAM N/A  07/31/2011   Procedure: CAROTID ANGIOGRAM;  Surgeon: Lorretta Harp, MD;  Location: [facility] CATH LAB;  Service: Cardiovascular;  Laterality: N/A;  carotid angiogram and possible Lt SCA PTA  . COLONOSCOPY W/ POLYPECTOMY  12/2013  . CORONARY ANGIOPLASTY    . ENDARTERECTOMY Left 11/08/2014   Procedure: LEFT CAROTID ENDARTERECTOMY WITH HEMASHIELD PATCH ANGIOPLASTY;  Surgeon: Elam Dutch, MD;  Location: [place];  Service: Vascular;  Laterality: Left;  . ESOPHAGOGASTRODUODENOSCOPY N/A 01/20/2014   Procedure: ESOPHAGOGASTRODUODENOSCOPY (EGD);  Surgeon: Jerene Bears, MD;  Location[facility] ENDOSCOPY;  Service: Endoscopy;  Laterality: N/A;  . FEMORAL-POPLITEAL BYPASS GRAFT Right 10/12/2013   Procedure:   Femoral-Peroneal trunk  bypass with nonreversed greater saphenous vein graft;  Surgeon: Elam Dutch, MD;  Location: [place];  Service: Vascular;  Laterality: Right;  . GIVENS CAPSULE STUDY N/A 01/20/2014   Procedure: GIVENS CAPSULE STUDY;  Surgeon: Jerene Bears, MD;  Location: [place];  Service: Gastroenterology;  Laterality: N/A;  . ICD GENERATOR CHANGEOUT N/A 03/04/2017  Procedure: ICD Generator Changeout;  Surgeon: [name], MD;  Location: [place] CV LAB;  Service: Cardiovascular;  Laterality: N/A;  . INSERT / REPLACE / REMOVE PACEMAKER    . INTRAOPERATIVE ARTERIOGRAM Right 10/12/2013   Procedure: INTRA OPERATIVE ARTERIOGRAM;  Surgeon: Elam Dutch, MD;  Location: [place];  Service: Vascular;  Laterality: Right;  . ORIF ELBOW FRACTURE  08/16/2011   Procedure: OPEN REDUCTION INTERNAL FIXATION (ORIF) ELBOW/OLECRANON FRACTURE;  Surgeon: Schuyler Amor, MD;  Location: [place];  Service: Orthopedics;  Laterality: Left;  . RENAL ANGIOGRAM N/A 08/12/2013   Procedure: RENAL ANGIOGRAM;  Surgeon: Elam Dutch, MD;  Location: [facility] CATH LAB;  Service: Cardiovascular;  Laterality: N/A;  . SUBCLAVIAN STENT PLACEMENT Left 07/31/2011   7x18 Genesis, balloon, with reduction of 90% ostial left subclavian artery  stenosis to 0% with residual excellent flow  . TONSILLECTOMY    . TUBAL LIGATION       Current Meds  Medication Sig  . ACCU-CHEK FASTCLIX LANCETS MISC USE   TO CHECK GLUCOSE ONCE DAILY  . ACCU-CHEK SMARTVIEW test strip USE  STRIP TO CHECK GLUCOSE ONCE DAILY  . albuterol (PROVENTIL) (2.5 MG/3ML) 0.083% nebulizer solution USE 1 VIAL IN NEBULIZER EVERY 4 HOURS AS NEEDED FOR WHEEZING FOR SHORTNESS OF BREATH  . allopurinol (ZYLOPRIM) 300 MG tablet Take 1 tablet (300 mg total) by mouth daily.  Marland Kitchen ALPRAZolam (XANAX) 1 MG tablet TAKE 1 TABLET BY MOUTH IN THE MORNING AND 1 & 1/2 (ONE & ONE-HALF) NIGHTLY AS NEEDED FOR ANXIETY  . aspirin 81 MG chewable tablet Chew 1 tablet (81 mg total) by mouth daily.  Marland Kitchen azelastine (ASTELIN) 0.1 % nasal spray USE 2 SPRAY(S) IN EACH NOSTRIL TWICE DAILY AS DIRECTED  . bisoprolol (ZEBETA) 5 MG tablet Take 1 tablet by mouth once daily  . cholecalciferol (VITAMIN D) 1000 units tablet Take 1,000 Units by mouth daily.  . clopidogrel (PLAVIX) 75 MG tablet Take 1 tablet by mouth once daily  . cyanocobalamin 1000 MCG tablet Take 1,000 mcg by mouth daily.  . DULoxetine (CYMBALTA) 60 MG capsule Take 1 capsule (60 mg total) by mouth 2 (two) times daily.  . furosemide (LASIX) 80 MG tablet TAKE 2 TABLETS BY MOUTH IN THE MORNING AND 2 IN THE EVENING  . Magnesium Oxide 400 MG CAPS Take 1 capsule (400 mg total) by mouth daily.  . nitroGLYCERIN (NITROSTAT) 0.4 MG SL tablet Place 0.4 mg under the tongue every 5 (five) minutes as needed for chest pain. X 3 doses  . potassium chloride SA (K-DUR) 20 MEQ tablet Take 2 tablets (40 mEq total) by mouth 2 (two) times daily.  Marland Kitchen PROAIR HFA 108 (90 Base) MCG/ACT inhaler INHALE 2 PUFFS BY MOUTH EVERY 4 HOURS AS NEEDED FOR WHEEZING AND FOR SHORTNESS OF BREATH  . [DISCONTINUED] acetaminophen (TYLENOL) 500 MG tablet Take 1 tablet (500 mg total) by mouth 2 (two) times daily.     Allergies:   Potassium-containing compounds, Neomycin-polymyxin b gu,  Other, Azithromycin, Codeine, Darvon, Erythromycin, Meloxicam, Norco [hydrocodone-acetaminophen], Penicillins, Propoxyphene n-acetaminophen, Rofecoxib, Rosuvastatin, Statins, and Sulfa antibiotics   Social History   Tobacco Use  . Smoking status: Former Smoker    Packs/day: 0.50    Years: 62.00    Pack years: 31.00    Types: Cigarettes    Quit date: 07/19/2017    Years since quitting: 2.1  . Smokeless tobacco: Never Used  Substance Use Topics  . Alcohol use: No    Alcohol/week: 0.0 standard drinks  .  Drug use: No     Family Hx: The patient's family history includes CAD in her father; Cancer in her sister; Colon cancer in her maternal grandmother; Deep vein thrombosis in her son; Diabetes in her sister; Heart disease in her father, mother, and sister; Hyperlipidemia in her father and another family member. There is no history of Anesthesia problems, Hypotension, Malignant hyperthermia, or Pseudochol deficiency.  ROS:   Please see the history of present illness.     All other systems reviewed and are negative.   Prior CV studies:   The following studies were reviewed today:  Pacemaker download 08/10/2019 Remote reviewed.  Not pacemaker dependent. Battery status is good. Lead measurements are stable. Heart rate histogram is favorable. A lengthy episode of torsades, self-terminated, occurred on 07/08/2019. This was during a period of fluid overload, per Optivol (now resolved).   Echocardiogram 08/24/2019  1. Left ventricular ejection fraction, by estimation, is 20 to 25%. The  left ventricle has severely decreased function. The left ventricle  demonstrates global hypokinesis. The left ventricular internal cavity size  was mildly dilated. Left ventricular  diastolic parameters are consistent with Grade II diastolic dysfunction  (pseudonormalization).  2. Right ventricular systolic function is normal. The right ventricular  size is normal. There is moderately elevated  pulmonary artery systolic  pressure.  3. The mitral valve is normal in structure. Moderate mitral valve  regurgitation.  4. Tricuspid valve regurgitation is mild to moderate.  5. The aortic valve is normal in structure. Aortic valve regurgitation is  trivial. No aortic stenosis is present.   Comparison(s): 07/20/17 EF 35-40%.   Nuclear stress test 08/24/2019   Nuclear stress EF: 21%.  The left ventricular ejection fraction is severely decreased (<30%).  NSR with Vpaced rhythm noted throughout study with occasional PVCs.  There is a small defect of moderate severity present in the apex location. The defect is non-reversible. There is no ischemia.  This is a high risk study due to LV dysfunction.  Compared to nuclear study of 2016, severe LV dilatation and dysfunction are now present.  Cardiac catheterization 2017  1. Severe 2 vessel CAD of the RCA and LCx, treated successfully with drug-eluting stents in each vessel 2. Moderate ostial LAD stenosis 3. Congestive heart failure with elevated PCWP/LVEDP 4. Moderate pulmonary HTN likely related to left heart disease (PVR 2 [place] units)  DAPT with ASA and Brilinta 12 months - consider [facility] Clinical Trial. Gentle fluids x 4 hours, likely further diuresis tomorrow if renal function stable.  Labs/Other Tests and Data Reviewed:    EKG:  An ECG dated 07/13/2019 was personally reviewed today and demonstrated:  Sinus rhythm with rare PVCs and demand biventricular pacing.  The paced QRS complex starts with a sharp small R wave in V1 and is remarkably narrow to 138 ms.  This is consistent with effective biventricular pacing.  Recent Labs: 03/09/2019: Hemoglobin 13.3; Platelets 253.0 07/13/2019: BNP 736.1; BUN 16; Creatinine, Ser 1.27; Magnesium 1.9; Potassium 3.7; Sodium 145   Recent Lipid Panel Lab Results  Component Value Date/Time   CHOL 183 07/02/2017 11:20 AM   TRIG 296.0 (H) 07/02/2017 11:20 AM   HDL 31.50 (L) 07/02/2017  11:20 AM   CHOLHDL 6 07/02/2017 11:20 AM   LDLCALC 140 (H) 12/19/2016 07:36 AM   LDLDIRECT 115.0 07/02/2017 11:20 AM    Wt Readings from Last 3 Encounters:  09/13/19 177 lb (80.3 kg)  07/13/19 188 lb (85.3 kg)  01/05/19 171 lb (77.6 kg)  Objective:    Vital Signs:  BP (!) 110/57   Pulse 79   Ht 5\' 5"  (1.651 m)   Wt 177 lb (80.3 kg)   BMI 29.45 kg/m    VITAL SIGNS:  reviewed GEN:  no acute distress EYES:  sclerae anicteric, EOMI - Extraocular Movements Intact RESPIRATORY:  normal respiratory effort, symmetric expansion CARDIOVASCULAR:  no peripheral edema SKIN:  no rash, lesions or ulcers. MUSCULOSKELETAL:  no obvious deformities. NEURO:  alert and oriented x 3, no obvious focal deficit  ASSESSMENT & PLAN:    1. VT: Polymorphic VT requiring appropriate ICD discharge in January.  Appears to have been torsades based on marker channel with a long short sequence.  Electrolytes were normal.  She appears to have reasonably good BiV pacing with a narrow complex QRS, but her EF is lower.  She was in heart failure exacerbation at the time of her VT, currently resolved by both clinical and thoracic impedance criteria.   She is on beta-blockers. Refer for EP consultation to discuss whether it is worth starting antiarrhythmic medications or pursuing ischemic work-up. 2. CHF: Severely depressed LVEF.  She has always had both ischemic and nonischemic components to her cardiomyopathy.  Heart failure therapy has been limited by periods of orthostatic hypotension that have led to syncope and falls.  As far as I can tell on this phone/video visit she is clinically euvolemic.  Her last device download confirmed thoracic impedance is at baseline.  NYHA functional class IIIa. 3. CAD: She does not have angina pectoris and her nuclear stress test does not reveal regional reversible perfusion defects.  It is possible that she has balanced ischemia.  Most recent cardiac catheterization was in December  2017 when stents were placed in the RCA and left circumflex. Still on DAPT. On beta blockers 4. CRT-D: normal device function. 90% effective CRT, largely due to PVCs. Antiarrhythmics? 5. PAD: asymptomatic, albeit sedentary. History of left subclavian stent, should monitor blood pressure always in the right arm. History of left carotid endarterectomy, followed by Dr. Oneida Alar. Asymptomatic decrease in left lower extremity ABI. 6. HLP: No longer on statin (muscle cramps).  I have been unable to convince her that she needs to be on alternative lipid-lowering therapy such as Repatha. 7. COPD: Quit smoking February 2019.    COVID-19 Education: The signs and symptoms of COVID-19 were discussed with the patient and how to seek care for testing (follow up with PCP or arrange E-visit).  The importance of social distancing was discussed today.  Time:   Today, I have spent 29 minutes with the patient with telehealth technology discussing the above problems.     Medication Adjustments/Labs and Tests Ordered: Current medicines are reviewed at length with the patient today.  Concerns regarding medicines are outlined above.   Tests Ordered: No orders of the defined types were placed in this encounter.   Medication Changes: No orders of the defined types were placed in this encounter.   Follow Up:  In Person 6 months   Patient Instructions  Medication Instructions:  No changes *If you need a refill on your cardiac medications before your next appointment, please call your pharmacy*   Lab Work: None ordered If you have labs (blood work) drawn today and your tests are completely normal, you will receive your results only by: Marland Kitchen MyChart Message (if you have MyChart) OR . A paper copy in the mail If you have any lab test that is abnormal or we need to  change your treatment, we will call you to review the results.   Testing/Procedures: None ordered   Follow-Up: At [facility], you and your  health needs are our priority.  As part of our continuing mission to provide you with exceptional heart care, we have created designated Provider Care Teams.  These Care Teams include your primary Cardiologist (physician) and Advanced Practice Providers (APPs -  Physician Assistants and Nurse Practitioners) who all work together to provide you with the care you need, when you need it.  We recommend signing up for the patient portal called "MyChart".  Sign up information is provided on this After Visit Summary.  MyChart is used to connect with patients for Virtual Visits (Telemedicine).  Patients are able to view lab/test results, encounter notes, upcoming appointments, etc.  Non-urgent messages can be sent to your provider as well.   To learn more about what you can do with MyChart, go to [email or web address].    Your next appointment:   6 month(s)  The format for your next appointment:   In Person  Provider:   [name], MD      Signed, [name], MD  09/15/2019 1:42 PM    [place]

## 2019-09-16 NOTE — Progress Notes (Signed)
Sorry, done!

## 2019-09-23 NOTE — Progress Notes (Signed)
No ICM remote transmission received for 09/12/2019 and next ICM transmission scheduled for 10/03/2019.   

## 2019-09-26 ENCOUNTER — Other Ambulatory Visit: Payer: Self-pay

## 2019-09-26 ENCOUNTER — Encounter: Payer: Self-pay | Admitting: Cardiology

## 2019-09-26 ENCOUNTER — Ambulatory Visit (INDEPENDENT_AMBULATORY_CARE_PROVIDER_SITE_OTHER): Payer: Medicare Other | Admitting: Cardiology

## 2019-09-26 VITALS — BP 132/80 | HR 81 | Ht 65.5 in | Wt 180.6 lb

## 2019-09-26 DIAGNOSIS — I472 Ventricular tachycardia, unspecified: Secondary | ICD-10-CM

## 2019-09-26 LAB — CUP PACEART INCLINIC DEVICE CHECK
Battery Remaining Longevity: 24 mo
Battery Voltage: 2.93 V
Brady Statistic AP VP Percent: 2.57 %
Brady Statistic AP VS Percent: 0.06 %
Brady Statistic AS VP Percent: 95.57 %
Brady Statistic AS VS Percent: 1.8 %
Brady Statistic RA Percent Paced: 2.6 %
Brady Statistic RV Percent Paced: 94.57 %
Date Time Interrogation Session: 20210412123600
HighPow Impedance: 57 Ohm
HighPow Impedance: 71 Ohm
Implantable Lead Implant Date: 20091202
Implantable Lead Implant Date: 20091202
Implantable Lead Implant Date: 20091202
Implantable Lead Location: 753858
Implantable Lead Location: 753859
Implantable Lead Location: 753860
Implantable Lead Model: 185
Implantable Lead Model: 4194
Implantable Lead Model: 4470
Implantable Lead Serial Number: 308680
Implantable Lead Serial Number: 635533
Implantable Pulse Generator Implant Date: 20180919
Lead Channel Impedance Value: 285 Ohm
Lead Channel Impedance Value: 418 Ohm
Lead Channel Impedance Value: 456 Ohm
Lead Channel Impedance Value: 551 Ohm
Lead Channel Impedance Value: 703 Ohm
Lead Channel Impedance Value: 703 Ohm
Lead Channel Pacing Threshold Amplitude: 0.5 V
Lead Channel Pacing Threshold Amplitude: 0.5 V
Lead Channel Pacing Threshold Amplitude: 1.5 V
Lead Channel Pacing Threshold Pulse Width: 0.4 ms
Lead Channel Pacing Threshold Pulse Width: 0.4 ms
Lead Channel Pacing Threshold Pulse Width: 1 ms
Lead Channel Sensing Intrinsic Amplitude: 31.625 mV
Lead Channel Sensing Intrinsic Amplitude: 5.4 mV
Lead Channel Setting Pacing Amplitude: 1.5 V
Lead Channel Setting Pacing Amplitude: 2 V
Lead Channel Setting Pacing Amplitude: 2 V
Lead Channel Setting Pacing Pulse Width: 0.4 ms
Lead Channel Setting Pacing Pulse Width: 1 ms
Lead Channel Setting Sensing Sensitivity: 0.3 mV

## 2019-09-26 MED ORDER — BISOPROLOL FUMARATE 10 MG PO TABS: 10.0000 mg | ORAL_TABLET | Freq: Every day | ORAL | 6 refills | Status: DC

## 2019-09-26 NOTE — Progress Notes (Signed)
Thank you :)

## 2019-09-26 NOTE — Patient Instructions (Addendum)
Medication Instructions:  Your physician has recommended you make the following change in your medication:  1. INCREASE Bisoprolol (Zebeta) to 10 mg once daily  *If you need a refill on your cardiac medications before your next appointment, please call your pharmacy*   Lab Work: None ordered If you have labs (blood work) drawn today and your tests are completely normal, you will receive your results only by: Marland Kitchen MyChart Message (if you have MyChart) OR . A paper copy in the mail If you have any lab test that is abnormal or we need to change your treatment, we will call you to review the results.   Testing/Procedures: None ordered   Follow-Up: At [facility], you and your health needs are our priority.  As part of our continuing mission to provide you with exceptional heart care, we have created designated Provider Care Teams.  These Care Teams include your primary Cardiologist (physician) and Advanced Practice Providers (APPs -  Physician Assistants and Nurse Practitioners) who all work together to provide you with the care you need, when you need it.  We recommend signing up for the patient portal called "MyChart".  Sign up information is provided on this After Visit Summary.  MyChart is used to connect with patients for Virtual Visits (Telemedicine).  Patients are able to view lab/test results, encounter notes, upcoming appointments, etc.  Non-urgent messages can be sent to your provider as well.   To learn more about what you can do with MyChart, go to [email or web address].    Your next appointment:   6 month(s)  The format for your next appointment:   In Person  Provider:   Allegra Lai, MD   Thank you for choosing [facility]!!   Trinidad Curet, RN [phone]    Other Instructions

## 2019-09-26 NOTE — Progress Notes (Signed)
Electrophysiology Office Note   Date:  09/26/2019   ID:  Sydney Phillips, DOB [birth year], MRN [id number]  PCP:  Laurey Morale, MD  Cardiologist:  [place] Primary Electrophysiologist:  Meggan Dhaliwal Meredith Leeds, MD    Chief Complaint: CHF   History of Present Illness: Sydney Phillips is a 84 y.o. female who is being seen today for the evaluation of CHF at the request of Croitoru, Mihai, MD. Presenting today for electrophysiology evaluation.  She has a history of combined ischemic and nonischemic cardiomyopathy, coronary artery disease, diabetes, hypertension, hyperlipidemia.  She has a Environmental health practitioner CRT-D implanted.  She had ICD shock 07/08/2019.  During that time, she was noncompliant with her potassium and magnesium supplementation.  Labs at the time showed normal electrolytes.  OptiVol did show signs of volume overload.  Recent echo shows ejection fraction of 20 to 25% with global hypokinesis.  She is on 2 L of oxygen at night.  Today, she denies symptoms of palpitations, chest pain, shortness of breath, orthopnea, PND, lower extremity edema, claudication, dizziness, presyncope, syncope, bleeding, or neurologic sequela. The patient is tolerating medications without difficulties.  Since her ICD shock, she has felt well.  She has had no further ICD discharges.  That was her first ICD shock.  She says that she was getting into bed and was shocked.  She did not have palpitations at the time.  She states that she took Xanax and a pain pill due to pain in her legs.   Past Medical History:  Diagnosis Date  . Anxiety   . Arthritis   . Asthma   . Automatic implantable cardioverter-defibrillator in situ   . Bronchitis   . CAD (coronary artery disease)    a. s/p DES to LCx/RCA 05/2016, ostial LAD disease.  . Cardiomyopathy ([place])   . Chronic back pain   . Chronic combined systolic and diastolic CHF (congestive heart failure) ([place])   . Chronic constipation   . Chronic pain   . CKD (chronic  kidney disease), stage III   . Colon polyps 2003.  2015.   HP polyps 2003.  adnomas 2015.  required referal to [facility] for colonoscopic resection of flat polyps.   Marland Kitchen COPD (chronic obstructive pulmonary disease) ([place])   . Dementia ([place])   . Depression   . Depression with anxiety    takes Cymbalta daily  . Diabetes mellitus ([place])   . Dyspnea   . Early cataracts, bilateral   . Fibromyalgia   . GERD (gastroesophageal reflux disease)    was on meds but was taken off;now watches what she eats  . Hemorrhoids   . History of kidney stones   . Hx of colonic polyps   . Hyperlipemia    takes Crestor daily  . Hypertension    takes Amlodipine and Metoprolol daily  . Insomnia   . LBBB (left bundle branch block)    Stress test 09/03/2010, EF 55  . Myocardial infarction ([place])   . PAD (peripheral artery disease) ([facility])    Carotid, subclavian, and lower extremity beds, currently not symptomatic  . Presence of combination internal cardiac defibrillator (ICD) and pacemaker   . Presence of permanent cardiac pacemaker   . Pulmonary hypertension ([place])   . S/P angioplasty with stent, lt. subclavian 07/31/11 08/01/2011  . Subclavian arterial stenosis, lt, with PTA/STENT 07/31/11 08/01/2011  . Syncope 07/28/2011   EF - 50-55, moderate concentric hypertrophy in left ventricle  . Urinary incontinence   . Vertigo  takes Meclizine prn   Past Surgical History:  Procedure Laterality Date  . ABDOMINAL AORTAGRAM N/A 08/12/2013   Procedure: ABDOMINAL Maxcine Ham;  Surgeon: Elam Dutch, MD;  Location: [facility] CATH LAB;  Service: Cardiovascular;  Laterality: N/A;  . ABDOMINAL HYSTERECTOMY    . APPENDECTOMY    . BACK SURGERY  2012  . BIV ICD GENERTAOR CHANGE OUT Left 02/20/2012   Procedure: BIV ICD GENERTAOR CHANGE OUT;  Surgeon: [name], MD;  [facility] CATH LAB;  Service: Cardiovascular;  Laterality: Left;  . CARDIAC CATHETERIZATION  12/01/2007   By Dr. Melvern Banker, left heart cath,   . CARDIAC CATHETERIZATION  N/A 05/20/2016   Procedure: Right/Left Heart Cath and Coronary Angiography;  Surgeon: Sherren Mocha, MD;  Location: [place] CV LAB;  Service: Cardiovascular;  Laterality: N/A;  . CARDIAC CATHETERIZATION N/A 05/20/2016   Procedure: Coronary Stent Intervention;  Surgeon: Sherren Mocha, MD;  Location: [place] CV LAB;  Service: Cardiovascular;  Laterality: N/A;  . CARDIAC DEFIBRILLATOR PLACEMENT  05/2008   By Dr Blanch Media, [employer] CANNOT HAVE MRI's  . CAROTID ANGIOGRAM N/A 07/31/2011   Procedure: CAROTID ANGIOGRAM;  Surgeon: Lorretta Harp, MD;  Location: [facility] CATH LAB;  Service: Cardiovascular;  Laterality: N/A;  carotid angiogram and possible Lt SCA PTA  . COLONOSCOPY W/ POLYPECTOMY  12/2013  . CORONARY ANGIOPLASTY    . ENDARTERECTOMY Left 11/08/2014   Procedure: LEFT CAROTID ENDARTERECTOMY WITH HEMASHIELD PATCH ANGIOPLASTY;  Surgeon: Elam Dutch, MD;  Location: [place];  Service: Vascular;  Laterality: Left;  . ESOPHAGOGASTRODUODENOSCOPY N/A 01/20/2014   Procedure: ESOPHAGOGASTRODUODENOSCOPY (EGD);  Surgeon: Jerene Bears, MD;  Location: [facility] ENDOSCOPY;  Service: Endoscopy;  Laterality: N/A;  . FEMORAL-POPLITEAL BYPASS GRAFT Right 10/12/2013   Procedure:   Femoral-Peroneal trunk  bypass with nonreversed greater saphenous vein graft;  Surgeon: Elam Dutch, MD;  Location: [place];  Service: Vascular;  Laterality: Right;  . GIVENS CAPSULE STUDY N/A 01/20/2014   Procedure: GIVENS CAPSULE STUDY;  Surgeon: Jerene Bears, MD;  Location: [place];  Service: Gastroenterology;  Laterality: N/A;  . ICD GENERATOR CHANGEOUT N/A 03/04/2017   Procedure: ICD Generator Changeout;  Surgeon: [name], MD;  Location: [place] CV LAB;  Service: Cardiovascular;  Laterality: N/A;  . INSERT / REPLACE / REMOVE PACEMAKER    . INTRAOPERATIVE ARTERIOGRAM Right 10/12/2013   Procedure: INTRA OPERATIVE ARTERIOGRAM;  Surgeon: Elam Dutch, MD;  Location: [place];  Service: Vascular;  Laterality: Right;  . ORIF  ELBOW FRACTURE  08/16/2011   Procedure: OPEN REDUCTION INTERNAL FIXATION (ORIF) ELBOW/OLECRANON FRACTURE;  Surgeon: Schuyler Amor, MD;  Location: [place];  Service: Orthopedics;  Laterality: Left;  . RENAL ANGIOGRAM N/A 08/12/2013   Procedure: RENAL ANGIOGRAM;  Surgeon: Elam Dutch, MD;  Location: [facility] CATH LAB;  Service: Cardiovascular;  Laterality: N/A;  . SUBCLAVIAN STENT PLACEMENT Left 07/31/2011   7x18 Genesis, balloon, with reduction of 90% ostial left subclavian artery stenosis to 0% with residual excellent flow  . TONSILLECTOMY    . TUBAL LIGATION       Current Outpatient Medications  Medication Sig Dispense Refill  . ACCU-CHEK FASTCLIX LANCETS MISC USE   TO CHECK GLUCOSE ONCE DAILY 102 each 5  . ACCU-CHEK SMARTVIEW test strip USE  STRIP TO CHECK GLUCOSE ONCE DAILY 100 each 0  . albuterol (PROVENTIL) (2.5 MG/3ML) 0.083% nebulizer solution USE 1 VIAL IN NEBULIZER EVERY 4 HOURS AS NEEDED FOR WHEEZING FOR SHORTNESS OF BREATH 225 mL 0  .  allopurinol (ZYLOPRIM) 300 MG tablet Take 1 tablet (300 mg total) by mouth daily. 90 tablet 3  . ALPRAZolam (XANAX) 1 MG tablet TAKE 1 TABLET BY MOUTH IN THE MORNING AND 1 & 1/2 (ONE & ONE-HALF) NIGHTLY AS NEEDED FOR ANXIETY 75 tablet 5  . aspirin 81 MG chewable tablet Chew 1 tablet (81 mg total) by mouth daily. 30 tablet 1  . azelastine (ASTELIN) 0.1 % nasal spray USE 2 SPRAY(S) IN EACH NOSTRIL TWICE DAILY AS DIRECTED 30 mL 0  . cholecalciferol (VITAMIN D) 1000 units tablet Take 1,000 Units by mouth daily.    . clopidogrel (PLAVIX) 75 MG tablet Take 1 tablet by mouth once daily 90 tablet 3  . cyanocobalamin 1000 MCG tablet Take 1,000 mcg by mouth daily.    . DULoxetine (CYMBALTA) 60 MG capsule Take 1 capsule (60 mg total) by mouth 2 (two) times daily. 270 capsule 3  . furosemide (LASIX) 80 MG tablet TAKE 2 TABLETS BY MOUTH IN THE MORNING AND 2 IN THE EVENING 120 tablet 11  . Magnesium Oxide 400 MG CAPS Take 1 capsule (400 mg total) by mouth daily. 180  capsule 3  . nitroGLYCERIN (NITROSTAT) 0.4 MG SL tablet Place 0.4 mg under the tongue every 5 (five) minutes as needed for chest pain. X 3 doses    . potassium chloride SA (K-DUR) 20 MEQ tablet Take 2 tablets (40 mEq total) by mouth 2 (two) times daily. 360 tablet 3  . PROAIR HFA 108 (90 Base) MCG/ACT inhaler INHALE 2 PUFFS BY MOUTH EVERY 4 HOURS AS NEEDED FOR WHEEZING AND FOR SHORTNESS OF BREATH 27 g 0  . bisoprolol (ZEBETA) 10 MG tablet Take 1 tablet (10 mg total) by mouth daily. 30 tablet 6   No current facility-administered medications for this visit.    Allergies:   Potassium-containing compounds, Neomycin-polymyxin b gu, Other, Azithromycin, Codeine, Darvon, Erythromycin, Meloxicam, Norco [hydrocodone-acetaminophen], Penicillins, Propoxyphene n-acetaminophen, Rofecoxib, Rosuvastatin, Statins, and Sulfa antibiotics   Social History:  The patient  reports that she quit smoking about 2 years ago. Her smoking use included cigarettes. She has a 31.00 pack-year smoking history. She has never used smokeless tobacco. She reports that she does not drink alcohol or use drugs.   Family History:  The patient's family history includes CAD in her father; Cancer in her sister; Colon cancer in her maternal grandmother; Deep vein thrombosis in her son; Diabetes in her sister; Heart disease in her father, mother, and sister; Hyperlipidemia in her father and another family member.    ROS:  Please see the history of present illness.   Otherwise, review of systems is positive for none.   All other systems are reviewed and negative.    PHYSICAL EXAM: VS:  BP 132/80   Pulse 81   Ht 5' 5.5" (1.664 m)   Wt 180 lb 9.6 oz (81.9 kg)   SpO2 93%   BMI 29.60 kg/m  , BMI Body mass index is 29.6 kg/m. GEN: Well nourished, well developed, in no acute distress  HEENT: normal  Neck: no JVD, carotid bruits, or masses Cardiac: RRR; no murmurs, rubs, or gallops,no edema  Respiratory:  clear to auscultation  bilaterally, normal work of breathing GI: soft, nontender, nondistended, + BS MS: no deformity or atrophy  Skin: warm and dry, device pocket is well healed Neuro:  Strength and sensation are intact Psych: euthymic mood, full affect  EKG:  EKG is ordered today. Personal review of the ekg ordered shows sinus rhythm, rate  74, ventricular paced  Device interrogation is reviewed today in detail.  See PaceArt for details.   Recent Labs: 03/09/2019: Hemoglobin 13.3; Platelets 253.0 07/13/2019: BNP 736.1; BUN 16; Creatinine, Ser 1.27; Magnesium 1.9; Potassium 3.7; Sodium 145    Lipid Panel     Component Value Date/Time   CHOL 183 07/02/2017 1120   TRIG 296.0 (H) 07/02/2017 1120   HDL 31.50 (L) 07/02/2017 1120   CHOLHDL 6 07/02/2017 1120   VLDL 59.2 (H) 07/02/2017 1120   LDLCALC 140 (H) 12/19/2016 0736   LDLDIRECT 115.0 07/02/2017 1120     Wt Readings from Last 3 Encounters:  09/26/19 180 lb 9.6 oz (81.9 kg)  09/13/19 177 lb (80.3 kg)  07/13/19 188 lb (85.3 kg)      Other studies Reviewed: Additional studies/ records that were reviewed today include: TTE 08/24/19  Review of the above records today demonstrates:  1. Left ventricular ejection fraction, by estimation, is 20 to 25%. The  left ventricle has severely decreased function. The left ventricle  demonstrates global hypokinesis. The left ventricular internal cavity size  was mildly dilated. Left ventricular  diastolic parameters are consistent with Grade II diastolic dysfunction  (pseudonormalization).  2. Right ventricular systolic function is normal. The right ventricular  size is normal. There is moderately elevated pulmonary artery systolic  pressure.  3. The mitral valve is normal in structure. Moderate mitral valve  regurgitation.  4. Tricuspid valve regurgitation is mild to moderate.  5. The aortic valve is normal in structure. Aortic valve regurgitation is  trivial. No aortic stenosis is present.  Myoview  08/24/19 1. Left ventricular ejection fraction, by estimation, is 20 to 25%. The  left ventricle has severely decreased function. The left ventricle  demonstrates global hypokinesis. The left ventricular internal cavity size  was mildly dilated. Left ventricular  diastolic parameters are consistent with Grade II diastolic dysfunction  (pseudonormalization).  2. Right ventricular systolic function is normal. The right ventricular  size is normal. There is moderately elevated pulmonary artery systolic  pressure.  3. The mitral valve is normal in structure. Moderate mitral valve  regurgitation.  4. Tricuspid valve regurgitation is mild to moderate.  5. The aortic valve is normal in structure. Aortic valve regurgitation is  trivial. No aortic stenosis is present.   ASSESSMENT AND PLAN:  1.  Chronic systolic heart failure due to ischemic and nonischemic cardiomyopathy: Status post [employer] CRT-D.  Currently on bisoprolol, but due to orthostatic hypotension has not been able to tolerate other medications.  She does have evidence of volume overload on her OptiVol, but she is breathing normally without shortness of breath.  We Sydney Phillips hold off on diuresis and let her primary cardiologist make changes to that.  2.  Ventricular tachycardia: Appeared polymorphic on ICD interrogation with long short sequences.  Electrolytes were normal at the time.  She was having heart failure exacerbation at the time of her ventricular tachycardia.  As this was her first ICD discharge, we Sydney Phillips plan to hold off on antiarrhythmics.  We Sydney Phillips increase her bisoprolol to 10 mg.  3.  Coronary artery disease: No reversible defects on stress testing.  4.  Hyperlipidemia: Currently not on a statin due to leg cramps.  She Sydney Phillips try Repatha.  Discussed with primary cardiology  Current medicines are reviewed at length with the patient today.   The patient does not have concerns regarding her medicines.  The following  changes were made today: Increase bisoprolol  Labs/ tests ordered today  include:  Orders Placed This Encounter  Procedures  . EKG 12-Lead     Disposition:   FU with Sydney Phillips 6 months  Signed, Sydney Phillips Meredith Leeds, MD  09/26/2019 12:32 PM     [facility] [address] [place] [address] [phone] (office) [phone] (fax)

## 2019-09-27 ENCOUNTER — Telehealth: Payer: Self-pay

## 2019-09-27 NOTE — Telephone Encounter (Signed)
Pt returned phone call.  She reports all day yesterday she didn't feel well.  She felt like she had indigestion.  She reports at time of episode she was sitting in chair reading, she does not think she fell asleep and does not recall feeling the shock.  Reviewed medications, pt indicates she missed the potassium yesterday and only took 5mg  of Bisoprolol since Dr. Curt Bears increased that dose during her visit.    Today pt feels better, aside from some fatigue she denies any other cardiac symptoms.    Reviewed DMV law with pt, she stated she has given up her drivers license and no longer drives.

## 2019-09-27 NOTE — Telephone Encounter (Signed)
ICD alert received- Pt had a treated VT episode last night at 2123.  ATP during charging unsuccessful, Episode terminated with shock..  Pt was seen in office yesterday for device interrogation.  It appears LV output was decreased, following this episode the RV threshold is measuring less than 2:1 programmed output.  Pt has history of one previous shock that may have been related to non-compliance of medications.    Current medications include Bisoprolol 10mg  daily (dosage increased on 09/26/19), magnesium oxide 400mg  daily, potassium 40 meq BID.    Attempted to reach pt to discuss symptoms and medications.  No answer, left VM requesting pt callback, also need to review shock plan and advised of DMV

## 2019-09-27 NOTE — Telephone Encounter (Signed)
Pt called back left a message on [place] voicemal. Please return her call

## 2019-09-28 ENCOUNTER — Encounter: Payer: Self-pay | Admitting: *Deleted

## 2019-09-28 MED ORDER — AMIODARONE HCL 200 MG PO TABS: 200.0000 mg | ORAL_TABLET | Freq: Every day | ORAL | 3 refills | Status: DC

## 2019-09-28 MED ORDER — AMIODARONE HCL 200 MG PO TABS: ORAL_TABLET | ORAL | 0 refills | Status: DC

## 2019-09-28 MED ORDER — BISOPROLOL FUMARATE 5 MG PO TABS: 5.0000 mg | ORAL_TABLET | Freq: Every day | ORAL | 6 refills | Status: DC

## 2019-09-28 NOTE — Telephone Encounter (Addendum)
Pt advised to start Amiodarone per Dr. Curt Bears.  - take 2 tablets (400 mg total) BID x 2 weeks, then  - take 1 tablet (200 mg total) BID x 2 weeks, then  - take 1 tablet (200 mg total) ONCE a day Will ask Dr. Sallyanne Kuster to obtain labs at her appointment on May 10th with him. Advised to decrease Bisoprolol back to 5 mg ONCE daily.  Pt agreeable Advised to call if issues begin after starting this medication. Will mail her instructions along w/ Amiodarone information. Patient verbalized understanding and agreeable to plan.

## 2019-09-28 NOTE — Telephone Encounter (Signed)
Dr. Curt Bears advice appreciated. I also think we should repeat heart cath to look for coronary blockages that we need to fix. Will try to schedule for cath early next week.

## 2019-09-28 NOTE — Addendum Note (Signed)
Addended by: Stanton Kidney on: 09/28/2019 05:00 PM   Modules accepted: Orders

## 2019-09-29 NOTE — Telephone Encounter (Signed)
Follow up  Patient is returning the call. Please call.

## 2019-09-29 NOTE — Telephone Encounter (Signed)
Left a message for the patient to call back.  

## 2019-09-30 ENCOUNTER — Other Ambulatory Visit: Payer: Self-pay

## 2019-09-30 ENCOUNTER — Emergency Department (HOSPITAL_COMMUNITY)
Admission: EM | Admit: 2019-09-30 | Discharge: 2019-09-30 | Disposition: A | Discharge status: Home / Self Care | Payer: Medicare Other | Attending: Emergency Medicine | Admitting: Emergency Medicine

## 2019-09-30 ENCOUNTER — Emergency Department (HOSPITAL_COMMUNITY): Payer: Medicare Other

## 2019-09-30 ENCOUNTER — Encounter (HOSPITAL_COMMUNITY): Payer: Self-pay | Admitting: Pediatrics

## 2019-09-30 DIAGNOSIS — R0602 Shortness of breath: Secondary | ICD-10-CM | POA: Insufficient documentation

## 2019-09-30 DIAGNOSIS — Z5321 Procedure and treatment not carried out due to patient leaving prior to being seen by health care provider: Secondary | ICD-10-CM | POA: Diagnosis not present

## 2019-09-30 DIAGNOSIS — F17210 Nicotine dependence, cigarettes, uncomplicated: Secondary | ICD-10-CM | POA: Insufficient documentation

## 2019-09-30 NOTE — ED Triage Notes (Signed)
C/o shortness of breath. Stated she has had this problem but it got worst 2 days ago. Patient states 1/2 pack smoker

## 2019-09-30 NOTE — Telephone Encounter (Signed)
Called the patient to schedule her cardiac cath. This has been scheduled for 4/21 with Dr. Claiborne Billings.  The patient was audibly short of breath while on the phone and could barely get her words out. She stated her current weight was 180 pounds which was down a pound from yesterday. She stated that she normally runs around 173-176 and when she gets to 180 pounds it concerns her.   She was started on Amiodarone yesterday for VT.  She has been advised that, per Dr. Sallyanne Kuster, she should go to the ED for worsening CHF symptoms. She has agreed and will have her son take her to [facility] ED. Cardiology has been notified of her pending arrival.

## 2019-09-30 NOTE — ED Notes (Signed)
Pt called this tech over, pt began yelling at this tech for the wait. Pt stated she wanted to call someone on the phone, pt was wheeled to phone and continued yelling. Security approached pt stated she wanted to leave. Security cut off armband and wheeled pt outside.

## 2019-10-03 ENCOUNTER — Ambulatory Visit (INDEPENDENT_AMBULATORY_CARE_PROVIDER_SITE_OTHER): Payer: Medicare Other

## 2019-10-03 ENCOUNTER — Telehealth: Payer: Self-pay | Admitting: *Deleted

## 2019-10-03 DIAGNOSIS — I5042 Chronic combined systolic (congestive) and diastolic (congestive) heart failure: Secondary | ICD-10-CM

## 2019-10-03 DIAGNOSIS — Z950 Presence of cardiac pacemaker: Secondary | ICD-10-CM | POA: Diagnosis not present

## 2019-10-03 DIAGNOSIS — Z01818 Encounter for other preprocedural examination: Secondary | ICD-10-CM

## 2019-10-03 NOTE — Telephone Encounter (Signed)
Advised pt to take Amiodarone 200 mg ONCE daily per Dr. Curt Bears. Pt voices that she is willing to try it. She will call back if SOB returns after starting.

## 2019-10-03 NOTE — Telephone Encounter (Signed)
Left a message for the patient to call back.  The patient is scheduled for a cardiac cath on Wednesday which may have to be rescheduled due to the patient unable to get her lab and covid test completed.  She was advised on Friday to go to the ED due to increased shortness of breath. She left without being seen. Please see previous note.

## 2019-10-03 NOTE — Progress Notes (Signed)
EPIC Encounter for ICM Monitoring  Patient Name: Sydney Phillips is a 84 y.o. female Date: 10/03/2019 Primary Care Physican: Laurey Morale, MD Primary Cardiologist:Croitoru Electrophysiologist: Croitoru BiV Pacing: 95.3%  10/03/2019 Weight:180.4lbs (baseline 171-176 lbs)   Spoke with patient.  She reports 5-6 pound weight gain.  Her only fluid symptom is weight gain.     OptivolThoracic impedancesuggesting possible fluid accumulation since 08/22/2019.  Prescribed:Furosemide 80 mg2 tablets(160 mg total)by mouthtwice a day(prescribed by Dr Sharlene Motts).Potassium 20 mEq 2tablets (40 mEq total)twice a day.  Labs: 03/09/2019 Creatinine 1.10, BUN 21, Potassium 4.3, Sodium 141, GFR 47.66 A complete set of results can be found in Results Review  Recommendations: Patient reports self adjusting Furosemide.  She took Furosemide 3 tablets (240 mg total) last night, this morning and will take this evening for fluid accumulation.  Follow-up plan: ICM clinic phone appointment on4/26/2021 (manual send) to recheck fluid symptoms. 91 day device clinic remote transmission 11/09/2019. Office visit with Dr Sallyanne Kuster on 10/24/2019.  Copy of ICM check sent to Dr.Croitoru.   3 month ICM trend: 10/03/2019    1 Year ICM trend:       [name], RN 10/03/2019 4:54 PM

## 2019-10-03 NOTE — Telephone Encounter (Signed)
Encounter closed. Please see other epic encounter.

## 2019-10-03 NOTE — Telephone Encounter (Signed)
Spoke with the patient. She stated that she was feeling better. Her shortness of breath has resolved for the most part. She was not audibly short of breath on the phone anymore. She did state that she stopped the Amiodarone because she felt like this was making her shortness of breath worse. She has been educated on stopping medications abruptly but she refuses to restart it.  The patient left the ED on Friday because she was told it would be 7 hours before she would be seen. She stated that she was not willing too wait and became upset when she was told she would have to wait.  The patient stated that she has a long history of being dissatisfied with [facility] and she refuses to have the cardiac cath within this health system. She stated that she feels like [facility] was responsible for her husband's death in 09-27-07 and since then she has not been satisfied with [facility]. She has been educated on the importance of having the cardiac cath done but still refuses. She has been advised that this will be cancelled but if she changes her mind to please call back.

## 2019-10-05 ENCOUNTER — Encounter (HOSPITAL_COMMUNITY): Admission: RE | Payer: Medicare Other | Source: Home / Self Care

## 2019-10-05 ENCOUNTER — Ambulatory Visit (HOSPITAL_COMMUNITY): Admission: RE | Admit: 2019-10-05 | Payer: Medicare Other | Source: Home / Self Care | Admitting: Cardiovascular Disease

## 2019-10-05 SURGERY — LEFT HEART CATH AND CORONARY ANGIOGRAPHY
Anesthesia: LOCAL

## 2019-10-10 ENCOUNTER — Ambulatory Visit (INDEPENDENT_AMBULATORY_CARE_PROVIDER_SITE_OTHER): Payer: Medicare Other

## 2019-10-10 ENCOUNTER — Telehealth: Payer: Self-pay | Admitting: Family Medicine

## 2019-10-10 DIAGNOSIS — I5042 Chronic combined systolic (congestive) and diastolic (congestive) heart failure: Secondary | ICD-10-CM

## 2019-10-10 DIAGNOSIS — Z950 Presence of cardiac pacemaker: Secondary | ICD-10-CM

## 2019-10-10 NOTE — Progress Notes (Signed)
  Chronic Care Management   Outreach Note  10/10/2019 Name: CALANDRA BONUS MRN: [id number] DOB: Jul 19, 1935  Referred by: Laurey Morale, MD Reason for referral : No chief complaint on file.   An unsuccessful telephone outreach was attempted today. The patient was referred to the pharmacist for assistance with care management and care coordination.    This note is not being shared with the patient for the following reason: To respect privacy (The patient or proxy has requested that the information not be shared). Follow Up Plan:    Raynicia Dukes [employer] Scheduler

## 2019-10-11 ENCOUNTER — Telehealth: Payer: Self-pay

## 2019-10-11 ENCOUNTER — Telehealth: Payer: Self-pay | Admitting: Cardiovascular Disease

## 2019-10-11 DIAGNOSIS — J449 Chronic obstructive pulmonary disease, unspecified: Secondary | ICD-10-CM | POA: Diagnosis not present

## 2019-10-11 DIAGNOSIS — J439 Emphysema, unspecified: Secondary | ICD-10-CM | POA: Diagnosis not present

## 2019-10-11 DIAGNOSIS — M5127 Other intervertebral disc displacement, lumbosacral region: Secondary | ICD-10-CM | POA: Diagnosis not present

## 2019-10-11 NOTE — Progress Notes (Signed)
Error This note is not being shared with the patient for the following reason: To respect privacy (The patient or proxy has requested that the information not be shared).     Sydney Phillips [employer] Scheduler

## 2019-10-11 NOTE — Telephone Encounter (Signed)
Patient states that she received a call from [facility]? I did not see any notes about anyone calling her. Maybe she meant Sherri, Dr. Curt Bears nurse?

## 2019-10-11 NOTE — Telephone Encounter (Signed)
Left message for patient to remind of missed remote transmission.  

## 2019-10-12 NOTE — Telephone Encounter (Signed)
Left message informing pt that I did not call but they may have called to remind her of missed device transmission. Asked to call back and speak with device clinic for further questions about transmission.

## 2019-10-12 NOTE — Telephone Encounter (Signed)
Patient returned missed call. I made patient aware that she missed remote transmission and contacted device team. Kem from device team stated the patient is still able to send transmission. I made patient aware that she may send in transmission.

## 2019-10-17 NOTE — Progress Notes (Signed)
EPIC Encounter for ICM Monitoring  Patient Name: Sydney Phillips is a 84 y.o. female Date: 10/17/2019 Primary Care Physican: Laurey Morale, MD Primary Cardiologist:Croitoru Electrophysiologist: Croitoru BiV Pacing: 97.6%  10/03/2019 Weight:180.4lbs (baseline 171-176 lbs)    Transmission reviewed.   OptivolThoracic impedancereturned to normal after taking extra Furosemide.  Prescribed:Furosemide 80 mg2 tablets(160 mg total)by mouthtwice a day(prescribed by Dr Sharlene Motts).Potassium 20 mEq 2tablets (40 mEq total)twice a day.  Labs: 03/09/2019 Creatinine 1.10, BUN 21, Potassium 4.3, Sodium 141, GFR 47.66 A complete set of results can be found in Results Review  Recommendations: None  Follow-up plan: ICM clinic phone appointment on5/24/2021. 91 day device clinic remote transmission5/26/2021. Office visit with Dr Sallyanne Kuster on 10/24/2019.  Copy of ICM check sent to Dr.Croitoru.  3 month ICM trend: 10/12/2019    1 Year ICM trend:       [name], RN 10/17/2019 4:32 PM

## 2019-10-21 ENCOUNTER — Telehealth: Payer: Self-pay

## 2019-10-21 NOTE — Telephone Encounter (Signed)
Needs follow up with EP app next week.

## 2019-10-21 NOTE — Telephone Encounter (Signed)
Received [facility] for 1 shock (25J) on 10/20/19 at 21:03 for episode in VT zone after ATP during charging. 3 NSVT episodes noted prior, +Amio. Presenting today AS 74/BIV-Paced 72. Called patient to assess. Patient son [name], [place]) answered. Optivol is also increased above baseline, +Lasix. States patient is currently asleep and appears to be okay, states patient sleeps well into the morning. Advised [name] to have patient call device clinic back so patient can be assess, verbalizes understanding. DC number provided.   PRESENTING 10/21/19: AS 74/BIV-Paced 72      SHOCK: 10/20/19 AT 21:03 (25J)

## 2019-10-21 NOTE — Telephone Encounter (Signed)
Patient returned phone call. States she does not remember a shock or any complaints yesterday. Patient does report of taking Amiodarone 200mg  daily. Patient states she was not able to take tapered dose d/t "making my heart race". Patient also reports of increasing Lasix 80mg  (3 tablets AM/3 tablets PM), d/t increasing weight gain x3 weeks. Patient states she was drinking large amounts of soda and drinks from [place]. Patient advised to reach out of Dr. Sarajane Jews regarding Lasix and self dosing changes.  Patient educated on shock plan. Patient also educated on no driving per [place] for 6 months starting on 10/20/19. Verbalizes understanding.

## 2019-10-21 NOTE — Telephone Encounter (Signed)
Noted. Patient has follow-up with Dr. Sallyanne Kuster 10/24/19 at 11:00AM. Called patient to confirm apt. Patient advised to call DC back if she has any further questions or concerns.

## 2019-10-22 ENCOUNTER — Emergency Department (HOSPITAL_COMMUNITY): Payer: Medicare Other

## 2019-10-22 ENCOUNTER — Other Ambulatory Visit: Payer: Self-pay

## 2019-10-22 ENCOUNTER — Encounter (HOSPITAL_COMMUNITY): Payer: Self-pay | Admitting: Emergency Medicine

## 2019-10-22 ENCOUNTER — Inpatient Hospital Stay (HOSPITAL_COMMUNITY)
Admission: EM | Admit: 2019-10-22 | Discharge: 2019-10-26 | DRG: 286 | Disposition: A | Discharge status: Home Health | Payer: Medicare Other | Attending: Internal Medicine | Admitting: Internal Medicine

## 2019-10-22 DIAGNOSIS — I951 Orthostatic hypotension: Secondary | ICD-10-CM | POA: Diagnosis not present

## 2019-10-22 DIAGNOSIS — Z880 Allergy status to penicillin: Secondary | ICD-10-CM

## 2019-10-22 DIAGNOSIS — I255 Ischemic cardiomyopathy: Secondary | ICD-10-CM | POA: Diagnosis present

## 2019-10-22 DIAGNOSIS — N183 Chronic kidney disease, stage 3 unspecified: Secondary | ICD-10-CM | POA: Diagnosis not present

## 2019-10-22 DIAGNOSIS — I25118 Atherosclerotic heart disease of native coronary artery with other forms of angina pectoris: Secondary | ICD-10-CM | POA: Diagnosis not present

## 2019-10-22 DIAGNOSIS — Z95820 Peripheral vascular angioplasty status with implants and grafts: Secondary | ICD-10-CM

## 2019-10-22 DIAGNOSIS — Z9049 Acquired absence of other specified parts of digestive tract: Secondary | ICD-10-CM

## 2019-10-22 DIAGNOSIS — R0902 Hypoxemia: Secondary | ICD-10-CM | POA: Diagnosis not present

## 2019-10-22 DIAGNOSIS — Z20822 Contact with and (suspected) exposure to covid-19: Secondary | ICD-10-CM | POA: Diagnosis present

## 2019-10-22 DIAGNOSIS — I739 Peripheral vascular disease, unspecified: Secondary | ICD-10-CM | POA: Diagnosis present

## 2019-10-22 DIAGNOSIS — Z90710 Acquired absence of both cervix and uterus: Secondary | ICD-10-CM

## 2019-10-22 DIAGNOSIS — I272 Pulmonary hypertension, unspecified: Secondary | ICD-10-CM | POA: Diagnosis present

## 2019-10-22 DIAGNOSIS — Z9089 Acquired absence of other organs: Secondary | ICD-10-CM

## 2019-10-22 DIAGNOSIS — Z8249 Family history of ischemic heart disease and other diseases of the circulatory system: Secondary | ICD-10-CM

## 2019-10-22 DIAGNOSIS — Z6832 Body mass index (BMI) 32.0-32.9, adult: Secondary | ICD-10-CM

## 2019-10-22 DIAGNOSIS — J449 Chronic obstructive pulmonary disease, unspecified: Secondary | ICD-10-CM | POA: Diagnosis present

## 2019-10-22 DIAGNOSIS — I472 Ventricular tachycardia, unspecified: Secondary | ICD-10-CM

## 2019-10-22 DIAGNOSIS — T82855A Stenosis of coronary artery stent, initial encounter: Secondary | ICD-10-CM | POA: Diagnosis not present

## 2019-10-22 DIAGNOSIS — E785 Hyperlipidemia, unspecified: Secondary | ICD-10-CM | POA: Diagnosis present

## 2019-10-22 DIAGNOSIS — F418 Other specified anxiety disorders: Secondary | ICD-10-CM | POA: Diagnosis present

## 2019-10-22 DIAGNOSIS — Z833 Family history of diabetes mellitus: Secondary | ICD-10-CM

## 2019-10-22 DIAGNOSIS — I959 Hypotension, unspecified: Secondary | ICD-10-CM | POA: Diagnosis not present

## 2019-10-22 DIAGNOSIS — I5043 Acute on chronic combined systolic (congestive) and diastolic (congestive) heart failure: Secondary | ICD-10-CM | POA: Diagnosis present

## 2019-10-22 DIAGNOSIS — I428 Other cardiomyopathies: Secondary | ICD-10-CM | POA: Diagnosis not present

## 2019-10-22 DIAGNOSIS — Z7982 Long term (current) use of aspirin: Secondary | ICD-10-CM

## 2019-10-22 DIAGNOSIS — E1136 Type 2 diabetes mellitus with diabetic cataract: Secondary | ICD-10-CM | POA: Diagnosis not present

## 2019-10-22 DIAGNOSIS — M797 Fibromyalgia: Secondary | ICD-10-CM | POA: Diagnosis not present

## 2019-10-22 DIAGNOSIS — I499 Cardiac arrhythmia, unspecified: Secondary | ICD-10-CM | POA: Diagnosis not present

## 2019-10-22 DIAGNOSIS — Z743 Need for continuous supervision: Secondary | ICD-10-CM | POA: Diagnosis not present

## 2019-10-22 DIAGNOSIS — Z95810 Presence of automatic (implantable) cardiac defibrillator: Secondary | ICD-10-CM

## 2019-10-22 DIAGNOSIS — Z9981 Dependence on supplemental oxygen: Secondary | ICD-10-CM

## 2019-10-22 DIAGNOSIS — E1151 Type 2 diabetes mellitus with diabetic peripheral angiopathy without gangrene: Secondary | ICD-10-CM | POA: Diagnosis not present

## 2019-10-22 DIAGNOSIS — I509 Heart failure, unspecified: Secondary | ICD-10-CM | POA: Diagnosis not present

## 2019-10-22 DIAGNOSIS — J310 Chronic rhinitis: Secondary | ICD-10-CM | POA: Diagnosis not present

## 2019-10-22 DIAGNOSIS — M5127 Other intervertebral disc displacement, lumbosacral region: Secondary | ICD-10-CM | POA: Diagnosis not present

## 2019-10-22 DIAGNOSIS — Z883 Allergy status to other anti-infective agents status: Secondary | ICD-10-CM

## 2019-10-22 DIAGNOSIS — I130 Hypertensive heart and chronic kidney disease with heart failure and stage 1 through stage 4 chronic kidney disease, or unspecified chronic kidney disease: Secondary | ICD-10-CM | POA: Diagnosis not present

## 2019-10-22 DIAGNOSIS — I4901 Ventricular fibrillation: Principal | ICD-10-CM | POA: Diagnosis present

## 2019-10-22 DIAGNOSIS — I2510 Atherosclerotic heart disease of native coronary artery without angina pectoris: Secondary | ICD-10-CM | POA: Diagnosis not present

## 2019-10-22 DIAGNOSIS — Z882 Allergy status to sulfonamides status: Secondary | ICD-10-CM

## 2019-10-22 DIAGNOSIS — Z7902 Long term (current) use of antithrombotics/antiplatelets: Secondary | ICD-10-CM

## 2019-10-22 DIAGNOSIS — Y831 Surgical operation with implant of artificial internal device as the cause of abnormal reaction of the patient, or of later complication, without mention of misadventure at the time of the procedure: Secondary | ICD-10-CM | POA: Diagnosis present

## 2019-10-22 DIAGNOSIS — R569 Unspecified convulsions: Secondary | ICD-10-CM | POA: Diagnosis not present

## 2019-10-22 DIAGNOSIS — Z83438 Family history of other disorder of lipoprotein metabolism and other lipidemia: Secondary | ICD-10-CM

## 2019-10-22 DIAGNOSIS — Z79899 Other long term (current) drug therapy: Secondary | ICD-10-CM

## 2019-10-22 DIAGNOSIS — H9190 Unspecified hearing loss, unspecified ear: Secondary | ICD-10-CM | POA: Diagnosis present

## 2019-10-22 DIAGNOSIS — J9611 Chronic respiratory failure with hypoxia: Secondary | ICD-10-CM | POA: Diagnosis not present

## 2019-10-22 DIAGNOSIS — Z885 Allergy status to narcotic agent status: Secondary | ICD-10-CM

## 2019-10-22 DIAGNOSIS — I214 Non-ST elevation (NSTEMI) myocardial infarction: Secondary | ICD-10-CM | POA: Diagnosis present

## 2019-10-22 DIAGNOSIS — I252 Old myocardial infarction: Secondary | ICD-10-CM

## 2019-10-22 DIAGNOSIS — Z86010 Personal history of colonic polyps: Secondary | ICD-10-CM

## 2019-10-22 DIAGNOSIS — G4733 Obstructive sleep apnea (adult) (pediatric): Secondary | ICD-10-CM | POA: Diagnosis present

## 2019-10-22 DIAGNOSIS — Z888 Allergy status to other drugs, medicaments and biological substances status: Secondary | ICD-10-CM

## 2019-10-22 DIAGNOSIS — Y712 Prosthetic and other implants, materials and accessory cardiovascular devices associated with adverse incidents: Secondary | ICD-10-CM | POA: Diagnosis present

## 2019-10-22 DIAGNOSIS — E669 Obesity, unspecified: Secondary | ICD-10-CM | POA: Diagnosis present

## 2019-10-22 DIAGNOSIS — Z800 Family history of malignant neoplasm of digestive organs: Secondary | ICD-10-CM

## 2019-10-22 DIAGNOSIS — I5021 Acute systolic (congestive) heart failure: Secondary | ICD-10-CM | POA: Diagnosis not present

## 2019-10-22 DIAGNOSIS — F0390 Unspecified dementia without behavioral disturbance: Secondary | ICD-10-CM | POA: Diagnosis present

## 2019-10-22 DIAGNOSIS — J9811 Atelectasis: Secondary | ICD-10-CM | POA: Diagnosis not present

## 2019-10-22 DIAGNOSIS — Z881 Allergy status to other antibiotic agents status: Secondary | ICD-10-CM

## 2019-10-22 DIAGNOSIS — J439 Emphysema, unspecified: Secondary | ICD-10-CM | POA: Diagnosis not present

## 2019-10-22 DIAGNOSIS — Z87891 Personal history of nicotine dependence: Secondary | ICD-10-CM

## 2019-10-22 DIAGNOSIS — R002 Palpitations: Secondary | ICD-10-CM | POA: Diagnosis not present

## 2019-10-22 DIAGNOSIS — E1122 Type 2 diabetes mellitus with diabetic chronic kidney disease: Secondary | ICD-10-CM | POA: Diagnosis not present

## 2019-10-22 DIAGNOSIS — Z03818 Encounter for observation for suspected exposure to other biological agents ruled out: Secondary | ICD-10-CM | POA: Diagnosis not present

## 2019-10-22 DIAGNOSIS — Z886 Allergy status to analgesic agent status: Secondary | ICD-10-CM

## 2019-10-22 DIAGNOSIS — Z87442 Personal history of urinary calculi: Secondary | ICD-10-CM

## 2019-10-22 LAB — BASIC METABOLIC PANEL
Anion gap: 15 (ref 5–15)
Anion gap: 15 (ref 5–15)
BUN: 20 mg/dL (ref 8–23)
BUN: 24 mg/dL — ABNORMAL HIGH (ref 8–23)
CO2: 28 mmol/L (ref 22–32)
CO2: 20 mmol/L — ABNORMAL LOW (ref 22–32)
Calcium: 9.3 mg/dL (ref 8.9–10.3)
Calcium: 9.2 mg/dL (ref 8.9–10.3)
Chloride: 100 mmol/L (ref 98–111)
Chloride: 104 mmol/L (ref 98–111)
Creatinine, Ser: 1.45 mg/dL — ABNORMAL HIGH (ref 0.44–1.00)
Creatinine, Ser: 1.18 mg/dL — ABNORMAL HIGH (ref 0.44–1.00)
GFR calc Af Amer: 39 mL/min — ABNORMAL LOW (ref >60)
GFR calc Af Amer: 50 mL/min — ABNORMAL LOW (ref >60)
GFR calc non Af Amer: 34 mL/min — ABNORMAL LOW (ref >60)
GFR calc non Af Amer: 43 mL/min — ABNORMAL LOW (ref >60)
Glucose, Bld: 170 mg/dL — ABNORMAL HIGH (ref 70–99)
Glucose, Bld: 185 mg/dL — ABNORMAL HIGH (ref 70–99)
Potassium: 3.2 mmol/L — ABNORMAL LOW (ref 3.5–5.1)
Potassium: 4.1 mmol/L (ref 3.5–5.1)
Sodium: 143 mmol/L (ref 135–145)
Sodium: 139 mmol/L (ref 135–145)

## 2019-10-22 LAB — BRAIN NATRIURETIC PEPTIDE: B Natriuretic Peptide: 769 pg/mL — ABNORMAL HIGH (ref 0.0–100.0)

## 2019-10-22 LAB — CBC
HCT: 43.5 % (ref 36.0–46.0)
Hemoglobin: 13.9 g/dL (ref 12.0–15.0)
MCH: 31.5 pg (ref 26.0–34.0)
MCHC: 32 g/dL (ref 30.0–36.0)
MCV: 98.6 fL (ref 80.0–100.0)
Platelets: 273 10*3/uL (ref 150–400)
RBC: 4.41 MIL/uL (ref 3.87–5.11)
RDW: 17.1 % — ABNORMAL HIGH (ref 11.5–15.5)
WBC: 10.1 10*3/uL (ref 4.0–10.5)
nRBC: 0 % (ref 0.0–0.2)

## 2019-10-22 LAB — SARS CORONAVIRUS 2 (TAT 6-24 HRS): SARS Coronavirus 2: NEGATIVE

## 2019-10-22 LAB — HEPATIC FUNCTION PANEL
ALT: 14 U/L (ref 0–44)
AST: 18 U/L (ref 15–41)
Albumin: 3.8 g/dL (ref 3.5–5.0)
Alkaline Phosphatase: 81 U/L (ref 38–126)
Bilirubin, Direct: 0.1 mg/dL (ref 0.0–0.2)
Indirect Bilirubin: 0.5 mg/dL (ref 0.3–0.9)
Total Bilirubin: 0.6 mg/dL (ref 0.3–1.2)
Total Protein: 6.7 g/dL (ref 6.5–8.1)

## 2019-10-22 LAB — TROPONIN I (HIGH SENSITIVITY)
Troponin I (High Sensitivity): 32 ng/L — ABNORMAL HIGH (ref <18)
Troponin I (High Sensitivity): 30 ng/L — ABNORMAL HIGH (ref <18)

## 2019-10-22 LAB — TSH: TSH: 6.117 u[IU]/mL — ABNORMAL HIGH (ref 0.350–4.500)

## 2019-10-22 LAB — LIPASE, BLOOD: Lipase: 29 U/L (ref 11–51)

## 2019-10-22 LAB — GLUCOSE, CAPILLARY
Glucose-Capillary: 184 mg/dL — ABNORMAL HIGH (ref 70–99)
Glucose-Capillary: 190 mg/dL — ABNORMAL HIGH (ref 70–99)

## 2019-10-22 LAB — MAGNESIUM: Magnesium: 1.8 mg/dL (ref 1.7–2.4)

## 2019-10-22 MED ORDER — POTASSIUM CHLORIDE CRYS ER 20 MEQ PO TBCR
40.0000 meq | EXTENDED_RELEASE_TABLET | Freq: Once | ORAL | Status: AC
  Administered 2019-10-22: 40 meq via ORAL
  Filled 2019-10-22: qty 2

## 2019-10-22 MED ORDER — ALBUTEROL SULFATE HFA 108 (90 BASE) MCG/ACT IN AERS
2.0000 | INHALATION_SPRAY | RESPIRATORY_TRACT | Status: DC | PRN
  Filled 2019-10-22: qty 6.7

## 2019-10-22 MED ORDER — SPIRONOLACTONE 12.5 MG HALF TABLET
12.5000 mg | ORAL_TABLET | Freq: Every day | ORAL | Status: DC
  Administered 2019-10-22 – 2019-10-26 (×5): 12.5 mg via ORAL
  Filled 2019-10-22 (×5): qty 1

## 2019-10-22 MED ORDER — POTASSIUM CHLORIDE 10 MEQ/100ML IV SOLN
10.0000 meq | INTRAVENOUS | Status: AC
  Administered 2019-10-22 (×2): 10 meq via INTRAVENOUS
  Filled 2019-10-22 (×2): qty 100

## 2019-10-22 MED ORDER — ASPIRIN 81 MG PO CHEW
81.0000 mg | CHEWABLE_TABLET | Freq: Every day | ORAL | Status: DC
  Administered 2019-10-22 – 2019-10-26 (×5): 81 mg via ORAL
  Filled 2019-10-22 (×6): qty 1

## 2019-10-22 MED ORDER — BISOPROLOL FUMARATE 5 MG PO TABS
5.0000 mg | ORAL_TABLET | Freq: Every day | ORAL | Status: DC
  Administered 2019-10-22 – 2019-10-26 (×5): 5 mg via ORAL
  Filled 2019-10-22 (×5): qty 1

## 2019-10-22 MED ORDER — ALLOPURINOL 300 MG PO TABS
300.0000 mg | ORAL_TABLET | Freq: Every day | ORAL | Status: DC
  Administered 2019-10-22 – 2019-10-26 (×5): 300 mg via ORAL
  Filled 2019-10-22: qty 1
  Filled 2019-10-22: qty 3
  Filled 2019-10-22 (×4): qty 1

## 2019-10-22 MED ORDER — ALPRAZOLAM 0.5 MG PO TABS
1.5000 mg | ORAL_TABLET | Freq: Every evening | ORAL | Status: DC | PRN
  Administered 2019-10-22: 1.5 mg via ORAL
  Administered 2019-10-22: 1 mg via ORAL
  Administered 2019-10-23 – 2019-10-25 (×3): 1.5 mg via ORAL
  Filled 2019-10-22 (×4): qty 3
  Filled 2019-10-22: qty 6

## 2019-10-22 MED ORDER — MAGNESIUM SULFATE 2 GM/50ML IV SOLN
2.0000 g | Freq: Once | INTRAVENOUS | Status: AC
  Administered 2019-10-22: 2 g via INTRAVENOUS
  Filled 2019-10-22: qty 50

## 2019-10-22 MED ORDER — SODIUM CHLORIDE 0.9 % IV SOLN: 250.0000 mL | INTRAVENOUS | Status: DC | PRN

## 2019-10-22 MED ORDER — SODIUM CHLORIDE 0.9% FLUSH: 3.0000 mL | Freq: Once | INTRAVENOUS | Status: DC

## 2019-10-22 MED ORDER — DULOXETINE HCL 60 MG PO CPEP
60.0000 mg | ORAL_CAPSULE | Freq: Two times a day (BID) | ORAL | Status: DC
  Administered 2019-10-22 – 2019-10-26 (×9): 60 mg via ORAL
  Filled 2019-10-22 (×11): qty 1

## 2019-10-22 MED ORDER — FUROSEMIDE 10 MG/ML IJ SOLN
40.0000 mg | Freq: Once | INTRAMUSCULAR | Status: AC
  Administered 2019-10-22: 40 mg via INTRAVENOUS
  Filled 2019-10-22: qty 4

## 2019-10-22 MED ORDER — SODIUM CHLORIDE 0.9% FLUSH
3.0000 mL | Freq: Two times a day (BID) | INTRAVENOUS | Status: DC
  Administered 2019-10-22 – 2019-10-24 (×3): 3 mL via INTRAVENOUS

## 2019-10-22 MED ORDER — SODIUM CHLORIDE 0.9% FLUSH: 3.0000 mL | INTRAVENOUS | Status: DC | PRN

## 2019-10-22 MED ORDER — VITAMIN B-12 1000 MCG PO TABS
1000.0000 ug | ORAL_TABLET | Freq: Every day | ORAL | Status: DC
  Administered 2019-10-22 – 2019-10-26 (×5): 1000 ug via ORAL
  Filled 2019-10-22 (×5): qty 1

## 2019-10-22 MED ORDER — AZELASTINE HCL 0.1 % NA SOLN
2.0000 | Freq: Two times a day (BID) | NASAL | Status: DC
  Administered 2019-10-22 – 2019-10-25 (×7): 2 via NASAL
  Filled 2019-10-22: qty 30

## 2019-10-22 MED ORDER — AMIODARONE HCL IN DEXTROSE 360-4.14 MG/200ML-% IV SOLN
60.0000 mg/h | INTRAVENOUS | Status: AC
  Administered 2019-10-22 (×2): 60 mg/h via INTRAVENOUS
  Filled 2019-10-22 (×2): qty 200

## 2019-10-22 MED ORDER — ALBUTEROL SULFATE (2.5 MG/3ML) 0.083% IN NEBU
2.5000 mg | INHALATION_SOLUTION | RESPIRATORY_TRACT | Status: DC | PRN
  Administered 2019-10-23: 2.5 mg via RESPIRATORY_TRACT
  Filled 2019-10-22: qty 3

## 2019-10-22 MED ORDER — AMIODARONE LOAD VIA INFUSION
150.0000 mg | Freq: Once | INTRAVENOUS | Status: AC
  Administered 2019-10-22: 150 mg via INTRAVENOUS
  Filled 2019-10-22: qty 83.34

## 2019-10-22 MED ORDER — VITAMIN D 25 MCG (1000 UNIT) PO TABS
1000.0000 [IU] | ORAL_TABLET | Freq: Every day | ORAL | Status: DC
  Administered 2019-10-22 – 2019-10-26 (×5): 1000 [IU] via ORAL
  Filled 2019-10-22 (×5): qty 1

## 2019-10-22 MED ORDER — HEPARIN SODIUM (PORCINE) 5000 UNIT/ML IJ SOLN
5000.0000 [IU] | Freq: Three times a day (TID) | INTRAMUSCULAR | Status: DC
  Administered 2019-10-22 – 2019-10-24 (×6): 5000 [IU] via SUBCUTANEOUS
  Filled 2019-10-22 (×6): qty 1

## 2019-10-22 MED ORDER — AMIODARONE HCL IN DEXTROSE 360-4.14 MG/200ML-% IV SOLN
30.0000 mg/h | INTRAVENOUS | Status: DC
  Administered 2019-10-23 – 2019-10-26 (×6): 30 mg/h via INTRAVENOUS
  Filled 2019-10-22 (×3): qty 200
  Filled 2019-10-22: qty 400
  Filled 2019-10-22 (×2): qty 200

## 2019-10-22 MED ORDER — CLOPIDOGREL BISULFATE 75 MG PO TABS
75.0000 mg | ORAL_TABLET | Freq: Every day | ORAL | Status: DC
  Administered 2019-10-22 – 2019-10-26 (×5): 75 mg via ORAL
  Filled 2019-10-22 (×5): qty 1

## 2019-10-22 NOTE — ED Notes (Signed)
Cards at bedside

## 2019-10-22 NOTE — ED Triage Notes (Signed)
Pt transported from home by [facility] with c/o "episodes" of dizziness/shob/near syncope. EKG from EMS have multiple arrhythmias including VT and paced. Pt reports 5 separate episodes tonight since 1900

## 2019-10-22 NOTE — ED Notes (Signed)
Potassium slowed to 78mL/hr per pt request

## 2019-10-22 NOTE — Progress Notes (Signed)
  Amiodarone Drug - Drug Interaction Consult Note  Recommendations: Monitor HR in combination with bisoprolol.   Amiodarone is metabolized by the cytochrome P450 system and therefore has the potential to cause many drug interactions. Amiodarone has an average plasma half-life of 50 days (range 20 to 100 days).   There is potential for drug interactions to occur several weeks or months after stopping treatment and the onset of drug interactions may be slow after initiating amiodarone.   []  Statins: Increased risk of myopathy. Simvastatin- restrict dose to 20mg  daily. Other statins: counsel patients to report any muscle pain or weakness immediately.  []  Anticoagulants: Amiodarone can increase anticoagulant effect. Consider warfarin dose reduction. Patients should be monitored closely and the dose of anticoagulant altered accordingly, remembering that amiodarone levels take several weeks to stabilize.  []  Antiepileptics: Amiodarone can increase plasma concentration of phenytoin, the dose should be reduced. Note that small changes in phenytoin dose can result in large changes in levels. Monitor patient and counsel on signs of toxicity.  [x]  Beta blockers: increased risk of bradycardia, AV block and myocardial depression. Sotalol - avoid concomitant use.  []   Calcium channel blockers (diltiazem and verapamil): increased risk of bradycardia, AV block and myocardial depression.  []   Cyclosporine: Amiodarone increases levels of cyclosporine. Reduced dose of cyclosporine is recommended.  []  Digoxin dose should be halved when amiodarone is started.  []  Diuretics: increased risk of cardiotoxicity if hypokalemia occurs.  []  Oral hypoglycemic agents (glyburide, glipizide, glimepiride): increased risk of hypoglycemia. Patient's glucose levels should be monitored closely when initiating amiodarone therapy.   []  Drugs that prolong the QT interval:  Torsades de pointes risk may be increased with  concurrent use - avoid if possible.  Monitor QTc, also keep magnesium/potassium WNL if concurrent therapy can't be avoided. Marland Kitchen Antibiotics: e.g. fluoroquinolones, erythromycin. . Antiarrhythmics: e.g. quinidine, procainamide, disopyramide, sotalol. . Antipsychotics: e.g. phenothiazines, haloperidol.  . Lithium, tricyclic antidepressants, and methadone.  Thank you,   Eddie Candle, PharmD PGY-1 Pharmacy Resident   Please check amion for clinical pharmacist contact number

## 2019-10-22 NOTE — ED Notes (Signed)
ED TO INPATIENT HANDOFF REPORT  ED Nurse Name and Phone #: Percell Locus, RN  S Name/Age/Gender Sydney Phillips 84 y.o. female Room/Bed: 038C/038C  Code Status   Code Status: Full Code  Home/SNF/Other Home Patient oriented to: self, place, time and situation Is this baseline? Yes   Triage Complete: Triage complete  Chief Complaint VT (ventricular tachycardia) ([place]) [I47.2]  Triage Note Pt transported from home by [facility] with c/o "episodes" of dizziness/shob/near syncope. EKG from EMS have multiple arrhythmias including VT and paced. Pt reports 5 separate episodes tonight since 1900     Allergies Allergies  Allergen Reactions  . Potassium-Containing Compounds Other (See Comments)    Causes severe constipation  . Neomycin-Polymyxin B Gu     Swollen throat   . Other Other (See Comments)    "mycin" antibiotics cause sleeplessness and itching  . Azithromycin Rash  . Codeine Itching  . Darvon Itching  . Erythromycin Rash  . Meloxicam Other (See Comments)    Unknown    . Norco [Hydrocodone-Acetaminophen] Itching  . Penicillins Rash and Other (See Comments)    Has patient had a PCN reaction causing immediate rash, facial/tongue/throat swelling, SOB or lightheadedness with hypotension: unknown Has patient had a PCN reaction causing severe rash involving mucus membranes or skin necrosis: no Has patient had a PCN reaction that required hospitalization: unknown Has patient had a PCN reaction occurring within the last 10 years: no If all of the above answers are "NO", then may proceed with Cephalosporin use.   Marland Kitchen Propoxyphene N-Acetaminophen Itching  . Rofecoxib Other (See Comments)    Unknown    . Rosuvastatin Other (See Comments)    cramps  . Statins Itching and Other (See Comments)    Sleeplessness Sleeplessness  . Sulfa Antibiotics Rash    Level of Care/Admitting Diagnosis ED Disposition    ED Disposition Condition [facility] Area: [place] [[id number]]  Level of Care: Telemetry Cardiac [103]  May admit patient to [facility] or [facility] if equivalent level of care is available:: No  Covid Evaluation: Asymptomatic Screening Protocol (No Symptoms)  Diagnosis: VT (ventricular tachycardia) [facility]) [id number]  Admitting Physician: Marcie Mowers [id number]  Attending Physician: Marcie Mowers [id number]  Estimated length of stay: past midnight tomorrow  Certification:: I certify this patient will need inpatient services for at least 2 midnights       B Medical/Surgery History Past Medical History:  Diagnosis Date  . Anxiety   . Arthritis   . Asthma   . Automatic implantable cardioverter-defibrillator in situ   . Bronchitis   . CAD (coronary artery disease)    a. s/p DES to LCx/RCA 05/2016, ostial LAD disease.  . Cardiomyopathy ([place])   . Chronic back pain   . Chronic combined systolic and diastolic CHF (congestive heart failure) ([place])   . Chronic constipation   . Chronic pain   . CKD (chronic kidney disease), stage III   . Colon polyps 2003.  2015.   HP polyps 2003.  adnomas 2015.  [facility] for [facility] of flat polyps.   Marland Kitchen COPD (chronic obstructive pulmonary disease) ([place])   . Dementia ([place])   . Depression   . Depression with anxiety    takes Cymbalta daily  . Diabetes mellitus ([place])   . Dyspnea   . Early cataracts, bilateral   . Fibromyalgia   . GERD (gastroesophageal reflux disease)    was on meds but was  taken off;now watches what she eats  . Hemorrhoids   . History of kidney stones   . Hx of colonic polyps   . Hyperlipemia    takes Crestor daily  . Hypertension    takes Amlodipine and Metoprolol daily  . Insomnia   . LBBB (left bundle branch block)    Stress test 09/03/2010, EF 55  . Myocardial infarction ([place])   . PAD (peripheral artery disease) ([facility])    Carotid, subclavian, and lower extremity beds, currently not symptomatic  . Presence of  combination internal cardiac defibrillator (ICD) and pacemaker   . Presence of permanent cardiac pacemaker   . Pulmonary hypertension ([place])   . S/P angioplasty with stent, lt. subclavian 07/31/11 08/01/2011  . Subclavian arterial stenosis, lt, with PTA/STENT 07/31/11 08/01/2011  . Syncope 07/28/2011   EF - 50-55, moderate concentric hypertrophy in left ventricle  . Urinary incontinence   . Vertigo    takes Meclizine prn   Past Surgical History:  Procedure Laterality Date  . ABDOMINAL AORTAGRAM N/A 08/12/2013   Procedure: ABDOMINAL Maxcine Ham;  Surgeon: Elam Dutch, MD;  Location: [facility] CATH LAB;  Service: Cardiovascular;  Laterality: N/A;  . ABDOMINAL HYSTERECTOMY    . APPENDECTOMY    . BACK SURGERY  2012  . BIV ICD GENERTAOR CHANGE OUT Left 02/20/2012   Procedure: BIV ICD GENERTAOR CHANGE OUT;  Surgeon: [name], MD;  Location: [facility] CATH LAB;  Service: Cardiovascular;  Laterality: Left;  . CARDIAC CATHETERIZATION  12/01/2007   By Dr. Melvern Banker, left heart cath,   . CARDIAC CATHETERIZATION N/A 05/20/2016   Procedure: Right/Left Heart Cath and Coronary Angiography;  Surgeon: Sherren Mocha, MD;  Location: [place] CV LAB;  Service: Cardiovascular;  Laterality: N/A;  . CARDIAC CATHETERIZATION N/A 05/20/2016   Procedure: Coronary Stent Intervention;  Surgeon: Sherren Mocha, MD;  Location: [place] CV LAB;  Service: Cardiovascular;  Laterality: N/A;  . CARDIAC DEFIBRILLATOR PLACEMENT  05/2008   By Dr Blanch Media, [employer] CANNOT HAVE MRI's  . CAROTID ANGIOGRAM N/A 07/31/2011   Procedure: CAROTID ANGIOGRAM;  Surgeon: Lorretta Harp, MD;  Location: [facility] CATH LAB;  Service: Cardiovascular;  Laterality: N/A;  carotid angiogram and possible Lt SCA PTA  . COLONOSCOPY W/ POLYPECTOMY  12/2013  . CORONARY ANGIOPLASTY    . ENDARTERECTOMY Left 11/08/2014   Procedure: LEFT CAROTID ENDARTERECTOMY WITH HEMASHIELD PATCH ANGIOPLASTY;  Surgeon: Elam Dutch, MD;  Location: [place];  Service: Vascular;   Laterality: Left;  . ESOPHAGOGASTRODUODENOSCOPY N/A 01/20/2014   Procedure: ESOPHAGOGASTRODUODENOSCOPY (EGD);  Surgeon: Jerene Bears, MD;  Location: [facility] ENDOSCOPY;  Service: Endoscopy;  Laterality: N/A;  . FEMORAL-POPLITEAL BYPASS GRAFT Right 10/12/2013   Procedure:   Femoral-Peroneal trunk  bypass with nonreversed greater saphenous vein graft;  Surgeon: Elam Dutch, MD;  Location: [place];  Service: Vascular;  Laterality: Right;  . GIVENS CAPSULE STUDY N/A 01/20/2014   Procedure: GIVENS CAPSULE STUDY;  Surgeon: Jerene Bears, MD;  Location: [place];  Service: Gastroenterology;  Laterality: N/A;  . ICD GENERATOR CHANGEOUT N/A 03/04/2017   Procedure: ICD Generator Changeout;  Surgeon: [name], MD;  Location: [place] CV LAB;  Service: Cardiovascular;  Laterality: N/A;  . INSERT / REPLACE / REMOVE PACEMAKER    . INTRAOPERATIVE ARTERIOGRAM Right 10/12/2013   Procedure: INTRA OPERATIVE ARTERIOGRAM;  Surgeon: Elam Dutch, MD;  Location: [place];  Service: Vascular;  Laterality: Right;  . ORIF ELBOW FRACTURE  08/16/2011   Procedure: OPEN REDUCTION INTERNAL FIXATION (ORIF)  ELBOW/OLECRANON FRACTURE;  Surgeon: Schuyler Amor, MD;  Location: [place];  Service: Orthopedics;  Laterality: Left;  . RENAL ANGIOGRAM N/A 08/12/2013   Procedure: RENAL ANGIOGRAM;  Surgeon: Elam Dutch, MD;  Location: [facility] CATH LAB;  Service: Cardiovascular;  Laterality: N/A;  . SUBCLAVIAN STENT PLACEMENT Left 07/31/2011   7x18 Genesis, balloon, with reduction of 90% ostial left subclavian artery stenosis to 0% with residual excellent flow  . TONSILLECTOMY    . TUBAL LIGATION       A IV Location/Drains/Wounds Patient Lines/Drains/Airways Status   Active Line/Drains/Airways    Name:   Placement date:   Placement time:   Site:   Days:   Peripheral IV 10/22/19 Anterior;Distal;Right Forearm   10/22/19    0311    Forearm   less than 1   Peripheral IV 10/22/19 Right;Lateral Forearm   10/22/19    0513    Forearm    less than 1   External Urinary Catheter   07/18/17    0640    -   826          Intake/Output Last 24 hours  Intake/Output Summary (Last 24 hours) at 10/22/2019 1225 Last data filed at 10/22/2019 1019 Gross per 24 hour  Intake 417.94 ml  Output 200 ml  Net 217.94 ml    Labs/Imaging Results for orders placed or performed during the hospital encounter of 10/22/19 (from the past 48 hour(s))  Basic metabolic panel     Status: Abnormal   Collection Time: 10/22/19  3:25 AM  Result Value Ref Range   Sodium 143 135 - 145 mmol/L   Potassium 3.2 (L) 3.5 - 5.1 mmol/L   Chloride 100 98 - 111 mmol/L   CO2 28 22 - 32 mmol/L   Glucose, Bld 170 (H) 70 - 99 mg/dL    Comment: Glucose reference range applies only to samples taken after fasting for at least 8 hours.   BUN 20 8 - 23 mg/dL   Creatinine, Ser 1.45 (H) 0.44 - 1.00 mg/dL   Calcium 9.3 8.9 - 10.3 mg/dL   GFR calc non Af Amer 34 (L) >60 mL/min   GFR calc Af Amer 39 (L) >60 mL/min   Anion gap 15 5 - 15    Comment: Performed at [facility] [address] [place], [place] [address]  CBC     Status: Abnormal   Collection Time: 10/22/19  3:25 AM  Result Value Ref Range   WBC 10.1 4.0 - 10.5 K/uL   RBC 4.41 3.87 - 5.11 MIL/uL   Hemoglobin 13.9 12.0 - 15.0 g/dL   HCT 43.5 36.0 - 46.0 %   MCV 98.6 80.0 - 100.0 fL   MCH 31.5 26.0 - 34.0 pg   MCHC 32.0 30.0 - 36.0 g/dL   RDW 17.1 (H) 11.5 - 15.5 %   Platelets 273 150 - 400 K/uL   nRBC 0.0 0.0 - 0.2 %    Comment: Performed at [facility], [place] [address] [place], [place] [address]  Troponin I (High Sensitivity)     Status: Abnormal   Collection Time: 10/22/19  3:25 AM  Result Value Ref Range   Troponin I (High Sensitivity) 32 (H) <18 ng/L    Comment: (NOTE) Elevated high sensitivity troponin I (hsTnI) values and significant  changes across serial measurements may suggest ACS but many other  chronic and acute conditions are known to elevate hsTnI results.  Refer to the  "Links" section  for chest pain algorithms and additional  guidance. Performed at [facility], [place] [address] [place], [place] [address]   Brain natriuretic peptide     Status: Abnormal   Collection Time: 10/22/19  3:25 AM  Result Value Ref Range   B Natriuretic Peptide 769.0 (H) 0.0 - 100.0 pg/mL    Comment: Performed at [facility] [address], [place], [place] [address]  Hepatic function panel     Status: None   Collection Time: 10/22/19  3:25 AM  Result Value Ref Range   Total Protein 6.7 6.5 - 8.1 g/dL   Albumin 3.8 3.5 - 5.0 g/dL   AST 18 15 - 41 U/L   ALT 14 0 - 44 U/L   Alkaline Phosphatase 81 38 - 126 U/L   Total Bilirubin 0.6 0.3 - 1.2 mg/dL   Bilirubin, Direct 0.1 0.0 - 0.2 mg/dL   Indirect Bilirubin 0.5 0.3 - 0.9 mg/dL    Comment: Performed at [facility] [address], [place], [place] [address]  Lipase, blood     Status: None   Collection Time: 10/22/19  3:25 AM  Result Value Ref Range   Lipase 29 11 - 51 U/L    Comment: Performed at [facility] [address], [place], [place] [address]  Magnesium     Status: None   Collection Time: 10/22/19  3:25 AM  Result Value Ref Range   Magnesium 1.8 1.7 - 2.4 mg/dL    Comment: Performed at [facility], [place] [address] [place], [place] [address]  TSH     Status: Abnormal   Collection Time: 10/22/19  3:25 AM  Result Value Ref Range   TSH 6.117 (H) 0.350 - 4.500 uIU/mL    Comment: Performed by a 3rd Generation assay with a functional sensitivity of <=0.01 uIU/mL. Performed at [facility], [place] [address], [place],  [address]    DG Chest Portable 1 View  Result Date: 10/22/2019 CLINICAL DATA:  Initial evaluation for near syncope. EXAM: PORTABLE CHEST 1 VIEW COMPARISON:  Prior radiograph from 09/30/2019 FINDINGS: Left-sided pacemaker/AICD in place. Cardiomegaly, stable. Mediastinal silhouette within normal limits. Lungs normally inflated. Chronic coarsening of the  pulmonary interstitial markings, similar to previous. Superimposed streaky bibasilar opacities felt to be most consistent with subsegmental atelectasis. No consolidative opacity. Mild pulmonary interstitial congestion without overt pulmonary edema. No pleural effusion. No pneumothorax. Few scattered calcified granulomas noted. No acute osseous finding.  Osteopenia. IMPRESSION: 1. Cardiomegaly with mild diffuse pulmonary interstitial congestion, with superimposed mild bibasilar atelectasis. 2. No other active cardiopulmonary disease. Electronically Signed   By: Jeannine Boga M.D.   On: 10/22/2019 03:42    Pending Labs Unresulted Labs (From admission, onward)    Start     Ordered   10/23/19 [id number]  Basic metabolic panel  Daily,   R     10/22/19 0803   10/22/19 [id number]  Basic metabolic panel  Once-Timed,   STAT     10/22/19 0638   10/22/19 0731  SARS CORONAVIRUS 2 (TAT 6-24 HRS) Nasopharyngeal Nasopharyngeal Swab  (Tier 3 (TAT 6-24 hrs))  ONCE - STAT,   STAT    Question Answer Comment  Is this test for diagnosis or screening Screening   Symptomatic for COVID-19 as defined by CDC No   Hospitalized for COVID-19 No   Admitted to ICU for COVID-19 No   Previously tested for COVID-19 No   Resident in a  congregate (group) care setting No   Employed in healthcare setting No   Pregnant No   Has patient completed COVID vaccination(s) (2 doses of Pfizer/Moderna 1 dose of [employer]) Unknown      10/22/19 0730          Vitals/Pain Today's Vitals   10/22/19 1115 10/22/19 1145 10/22/19 1200 10/22/19 1215  BP: (!) 142/97 (!) 151/86 (!) 154/85 (!) 167/95  Pulse: 65 64 63 64  Resp: 20 (!) 29 (!) 25 (!) 23  Temp:      TempSrc:      SpO2: 95% 95% 94% 94%  Weight:      Height:      PainSc:        Isolation Precautions No active isolations  Medications Medications  sodium chloride flush (NS) 0.9 % injection 3 mL (3 mLs Intravenous Not Given 10/22/19 0424)  amiodarone (NEXTERONE PREMIX)  360-4.14 MG/200ML-% (1.8 mg/mL) IV infusion (0 mg/hr Intravenous Stopped 10/22/19 1027)    Followed by  amiodarone (NEXTERONE PREMIX) 360-4.14 MG/200ML-% (1.8 mg/mL) IV infusion (30 mg/hr Intravenous Rate/Dose Change 10/22/19 1025)  allopurinol (ZYLOPRIM) tablet 300 mg (300 mg Oral Given 10/22/19 1028)  aspirin chewable tablet 81 mg (81 mg Oral Given 10/22/19 1028)  bisoprolol (ZEBETA) tablet 5 mg (5 mg Oral Given 10/22/19 1146)  ALPRAZolam (XANAX) tablet 1.5 mg (1 mg Oral Given 10/22/19 1148)  DULoxetine (CYMBALTA) DR capsule 60 mg (60 mg Oral Given 10/22/19 1029)  clopidogrel (PLAVIX) tablet 75 mg (75 mg Oral Given 10/22/19 1028)  vitamin B-12 (CYANOCOBALAMIN) tablet 1,000 mcg (1,000 mcg Oral Given 10/22/19 1028)  cholecalciferol (VITAMIN D3) tablet 1,000 Units (1,000 Units Oral Given 10/22/19 1029)  albuterol (PROVENTIL) (2.5 MG/3ML) 0.083% nebulizer solution 2.5 mg (has no administration in time range)  azelastine (ASTELIN) 0.1 % nasal spray 2 spray (0 sprays Each Nare Hold 10/22/19 1147)  albuterol (VENTOLIN HFA) 108 (90 Base) MCG/ACT inhaler 2 puff (has no administration in time range)  sodium chloride flush (NS) 0.9 % injection 3 mL (3 mLs Intravenous Given 10/22/19 1031)  sodium chloride flush (NS) 0.9 % injection 3 mL (has no administration in time range)  0.9 %  sodium chloride infusion (has no administration in time range)  heparin injection 5,000 Units (5,000 Units Subcutaneous Given 10/22/19 1033)  spironolactone (ALDACTONE) tablet 12.5 mg (12.5 mg Oral Given 10/22/19 1146)  amiodarone (NEXTERONE) 1.8 mg/mL load via infusion 150 mg (150 mg Intravenous Bolus from Bag 10/22/19 0423)  furosemide (LASIX) injection 40 mg (40 mg Intravenous Given 10/22/19 0423)  potassium chloride 10 mEq in 100 mL IVPB (0 mEq Intravenous Stopped 10/22/19 0900)  potassium chloride SA (KLOR-CON) CR tablet 40 mEq (40 mEq Oral Given 10/22/19 0752)  magnesium sulfate IVPB 2 g 50 mL (0 g Intravenous Stopped 10/22/19 1019)    Mobility walks  with device     Focused Assessments Pulmonary Assessment Handoff:  Lung sounds: Bilateral Breath Sounds: Diminished, Expiratory wheezes L Breath Sounds: Diminished, Expiratory wheezes R Breath Sounds: Diminished, Expiratory wheezes O2 Device: Nasal Cannula O2 Flow Rate (L/min): 3 L/min      R Recommendations: See Admitting Provider Note  Report given to:   Additional Notes:

## 2019-10-22 NOTE — Consult Note (Signed)
Cardiology Consultation:   Patient ID: Sydney Phillips MRN: [id number]; DOB: 1935/07/19  Admit date: 10/22/2019 Date of Consult: 10/22/2019  Primary Care Provider: Laurey Morale, MD Primary Cardiologist: [name], MD  Primary Electrophysiologist:  [name], MD   Patient Profile:   Sydney Phillips is a 84 y.o. female with a hx of PMVT and a mixed CM who is being seen today for the evaluation of recurent ICD shocks due to VF at the request of Dr. Neena Rhymes.  History of Present Illness:   Sydney Phillips has a long standing h/o CAD and LV dysfunction and underwent ICD insertion initially over 10 years ago. She has a h/o recurrent ICD therapies due to VF and PMVT. She was in her usual state of health last night when she received 4 ICD shocks. She did not actually feel being shocked but felt like the air was being squeezed out of her. No chest pain. She does note some increased dyspnea. She denies medical non-compliance to me. She has not had peripheral edema.    Past Medical History:  Diagnosis Date  . Anxiety   . Arthritis   . Asthma   . Automatic implantable cardioverter-defibrillator in situ   . Bronchitis   . CAD (coronary artery disease)    a. s/p DES to LCx/RCA 05/2016, ostial LAD disease.  . Cardiomyopathy ([place])   . Chronic back pain   . Chronic combined systolic and diastolic CHF (congestive heart failure) ([place])   . Chronic constipation   . Chronic pain   . CKD (chronic kidney disease), stage III   . Colon polyps 2003.  2015.   HP polyps 2003.  adnomas 2015.  required referal to [facility] for colonoscopic resection of flat polyps.   Marland Kitchen COPD (chronic obstructive pulmonary disease) ([place])   . Dementia ([place])   . Depression   . Depression with anxiety    takes Cymbalta daily  . Diabetes mellitus ([place])   . Dyspnea   . Early cataracts, bilateral   . Fibromyalgia   . GERD (gastroesophageal reflux disease)    was on meds but was taken off;now watches what she  eats  . Hemorrhoids   . History of kidney stones   . Hx of colonic polyps   . Hyperlipemia    takes Crestor daily  . Hypertension    takes Amlodipine and Metoprolol daily  . Insomnia   . LBBB (left bundle branch block)    Stress test 09/03/2010, EF 55  . Myocardial infarction ([place])   . PAD (peripheral artery disease) ([facility])    Carotid, subclavian, and lower extremity beds, currently not symptomatic  . Presence of combination internal cardiac defibrillator (ICD) and pacemaker   . Presence of permanent cardiac pacemaker   . Pulmonary hypertension ([place])   . S/P angioplasty with stent, lt. subclavian 07/31/11 08/01/2011  . Subclavian arterial stenosis, lt, with PTA/STENT 07/31/11 08/01/2011  . Syncope 07/28/2011   EF - 50-55, moderate concentric hypertrophy in left ventricle  . Urinary incontinence   . Vertigo    takes Meclizine prn    Past Surgical History:  Procedure Laterality Date  . ABDOMINAL AORTAGRAM N/A 08/12/2013   Procedure: ABDOMINAL Maxcine Ham;  Surgeon: Elam Dutch, MD;  Location: [facility] CATH LAB;  Service: Cardiovascular;  Laterality: N/A;  . ABDOMINAL HYSTERECTOMY    . APPENDECTOMY    . BACK SURGERY  2012  . BIV ICD GENERTAOR CHANGE OUT Left 02/20/2012   Procedure: BIV ICD GENERTAOR CHANGE OUT;  Surgeon: [name], MD;  Location: [facility] CATH LAB;  Service: Cardiovascular;  Laterality: Left;  . CARDIAC CATHETERIZATION  12/01/2007   By Dr. Melvern Banker, left heart cath,   . CARDIAC CATHETERIZATION N/A 05/20/2016   Procedure: Right/Left Heart Cath and Coronary Angiography;  Surgeon: Sherren Mocha, MD;  Location: [place] CV LAB;  Service: Cardiovascular;  Laterality: N/A;  . CARDIAC CATHETERIZATION N/A 05/20/2016   Procedure: Coronary Stent Intervention;  Surgeon: Sherren Mocha, MD;  Location: [place] CV LAB;  Service: Cardiovascular;  Laterality: N/A;  . CARDIAC DEFIBRILLATOR PLACEMENT  05/2008   By Dr Blanch Media, [employer] CANNOT HAVE MRI's  . CAROTID ANGIOGRAM N/A 07/31/2011     Procedure: CAROTID ANGIOGRAM;  Surgeon: Lorretta Harp, MD;  Location: [facility] CATH LAB;  Service: Cardiovascular;  Laterality: N/A;  carotid angiogram and possible Lt SCA PTA  . COLONOSCOPY W/ POLYPECTOMY  12/2013  . CORONARY ANGIOPLASTY    . ENDARTERECTOMY Left 11/08/2014   Procedure: LEFT CAROTID ENDARTERECTOMY WITH HEMASHIELD PATCH ANGIOPLASTY;  Surgeon: Elam Dutch, MD;  Location: [place];  Service: Vascular;  Laterality: Left;  . ESOPHAGOGASTRODUODENOSCOPY N/A 01/20/2014   Procedure: ESOPHAGOGASTRODUODENOSCOPY (EGD);  Surgeon: Jerene Bears, MD;  Location: [facility] ENDOSCOPY;  Service: Endoscopy;  Laterality: N/A;  . FEMORAL-POPLITEAL BYPASS GRAFT Right 10/12/2013   Procedure:   Femoral-Peroneal trunk  bypass with nonreversed greater saphenous vein graft;  Surgeon: Elam Dutch, MD;  Location: [place];  Service: Vascular;  Laterality: Right;  . GIVENS CAPSULE STUDY N/A 01/20/2014   Procedure: GIVENS CAPSULE STUDY;  Surgeon: Jerene Bears, MD;  Location: [place];  Service: Gastroenterology;  Laterality: N/A;  . ICD GENERATOR CHANGEOUT N/A 03/04/2017   Procedure: ICD Generator Changeout;  Surgeon: [name], MD;  Location: [place] CV LAB;  Service: Cardiovascular;  Laterality: N/A;  . INSERT / REPLACE / REMOVE PACEMAKER    . INTRAOPERATIVE ARTERIOGRAM Right 10/12/2013   Procedure: INTRA OPERATIVE ARTERIOGRAM;  Surgeon: Elam Dutch, MD;  Location: [place];  Service: Vascular;  Laterality: Right;  . ORIF ELBOW FRACTURE  08/16/2011   Procedure: OPEN REDUCTION INTERNAL FIXATION (ORIF) ELBOW/OLECRANON FRACTURE;  Surgeon: Schuyler Amor, MD;  Location: [place];  Service: Orthopedics;  Laterality: Left;  . RENAL ANGIOGRAM N/A 08/12/2013   Procedure: RENAL ANGIOGRAM;  Surgeon: Elam Dutch, MD;  Location[facility] CATH LAB;  Service: Cardiovascular;  Laterality: N/A;  . SUBCLAVIAN STENT PLACEMENT Left 07/31/2011   7x18 Genesis, balloon, with reduction of 90% ostial left subclavian artery stenosis  to 0% with residual excellent flow  . TONSILLECTOMY    . TUBAL LIGATION       Home Medications:  Prior to Admission medications   Medication Sig Start Date End Date Taking? Authorizing Provider  albuterol (PROVENTIL) (2.5 MG/3ML) 0.083% nebulizer solution USE 1 VIAL IN NEBULIZER EVERY 4 HOURS AS NEEDED FOR WHEEZING FOR SHORTNESS OF BREATH Patient taking differently: Take 2.5 mg by nebulization every 4 (four) hours as needed for wheezing.  05/18/19  Yes Laurey Morale, MD  allopurinol (ZYLOPRIM) 300 MG tablet Take 1 tablet (300 mg total) by mouth daily. 11/12/18  Yes Laurey Morale, MD  ALPRAZolam Duanne Moron) 1 MG tablet TAKE 1 TABLET BY MOUTH IN THE MORNING AND 1 & 1/2 (ONE & ONE-HALF) NIGHTLY AS NEEDED FOR ANXIETY Patient taking differently: Take 1.5 mg by mouth at bedtime as needed for sleep.  05/11/19  Yes Laurey Morale, MD  aspirin 81 MG chewable tablet Chew 1 tablet (81  mg total) by mouth daily. 05/23/16  Yes Reyne Dumas, MD  azelastine (ASTELIN) 0.1 % nasal spray USE 2 SPRAY(S) IN EACH NOSTRIL TWICE DAILY AS DIRECTED Patient taking differently: Place 2 sprays into both nostrils 2 (two) times daily.  08/08/19  Yes Laurey Morale, MD  bisoprolol (ZEBETA) 5 MG tablet Take 1 tablet (5 mg total) by mouth daily. 09/28/19  Yes Camnitz, Ocie Doyne, MD  cholecalciferol (VITAMIN D) 1000 units tablet Take 1,000 Units by mouth daily.   Yes [provider]  clopidogrel (PLAVIX) 75 MG tablet Take 1 tablet by mouth once daily 06/30/19  Yes Laurey Morale, MD  cyanocobalamin 1000 MCG tablet Take 1,000 mcg by mouth daily.   Yes [provider]  DULoxetine (CYMBALTA) 60 MG capsule Take 1 capsule (60 mg total) by mouth 2 (two) times daily. 01/05/19  Yes Laurey Morale, MD  furosemide (LASIX) 80 MG tablet TAKE 2 TABLETS BY MOUTH IN THE MORNING AND 2 IN THE EVENING Patient taking differently: Take 160 mg by mouth 2 (two) times daily.  01/05/19  Yes Laurey Morale, MD  nitroGLYCERIN (NITROSTAT) 0.4  MG SL tablet Place 0.4 mg under the tongue every 5 (five) minutes as needed for chest pain. X 3 doses   Yes [provider]  potassium chloride SA (K-DUR) 20 MEQ tablet Take 2 tablets (40 mEq total) by mouth 2 (two) times daily. 11/12/18  Yes Laurey Morale, MD  PROAIR HFA 108 [id number] Base) MCG/ACT inhaler INHALE 2 PUFFS BY MOUTH EVERY 4 HOURS AS NEEDED FOR WHEEZING AND FOR SHORTNESS OF BREATH Patient taking differently: Inhale 2 puffs into the lungs every 4 (four) hours as needed for wheezing.  08/08/19  Yes Laurey Morale, MD  ACCU-CHEK FASTCLIX LANCETS MISC USE   TO CHECK GLUCOSE ONCE DAILY 07/27/18   Laurey Morale, MD  ACCU-CHEK SMARTVIEW test strip USE  STRIP TO CHECK GLUCOSE ONCE DAILY 06/15/19   Laurey Morale, MD  amiodarone (PACERONE) 200 MG tablet Take 2 tablets (400 mg total) TWICE a day for 2 weeks. Then reduce to 1 tablet (200 mg total) TWICE a day for 2 weeks. Then reduce to 1 tablet ONCE a day. Patient not taking: Reported on 10/22/2019 09/28/19   [name], MD  amiodarone (PACERONE) 200 MG tablet Take 1 tablet (200 mg total) by mouth daily. Patient not taking: Reported on 10/22/2019 09/28/19   [name], MD  Magnesium Oxide 400 MG CAPS Take 1 capsule (400 mg total) by mouth daily. 07/15/19   Croitoru, Mihai, MD    Inpatient Medications: Scheduled Meds: . allopurinol  300 mg Oral Daily  . aspirin  81 mg Oral Daily  . azelastine  2 spray Each Nare BID  . bisoprolol  5 mg Oral Daily  . cholecalciferol  1,000 Units Oral Daily  . clopidogrel  75 mg Oral Daily  . DULoxetine  60 mg Oral BID  . heparin  5,000 Units Subcutaneous Q8H  . sodium chloride flush  3 mL Intravenous Once  . sodium chloride flush  3 mL Intravenous Q12H  . spironolactone  12.5 mg Oral Daily  . cyanocobalamin  1,000 mcg Oral Daily   Continuous Infusions: . sodium chloride    . amiodarone 30 mg/hr (10/22/19 1025)   PRN Meds: sodium chloride, albuterol, albuterol, ALPRAZolam, sodium  chloride flush  Allergies:    Allergies  Allergen Reactions  . Potassium-Containing Compounds Other (See Comments)    Causes severe constipation  .  Neomycin-Polymyxin B Gu     Swollen throat   . Other Other (See Comments)    "mycin" antibiotics cause sleeplessness and itching  . Azithromycin Rash  . Codeine Itching  . Darvon Itching  . Erythromycin Rash  . Meloxicam Other (See Comments)    Unknown    . Norco [Hydrocodone-Acetaminophen] Itching  . Penicillins Rash and Other (See Comments)    Has patient had a PCN reaction causing immediate rash, facial/tongue/throat swelling, SOB or lightheadedness with hypotension: unknown Has patient had a PCN reaction causing severe rash involving mucus membranes or skin necrosis: no Has patient had a PCN reaction that required hospitalization: unknown Has patient had a PCN reaction occurring within the last 10 years: no If all of the above answers are "NO", then may proceed with Cephalosporin use.   Marland Kitchen Propoxyphene N-Acetaminophen Itching  . Rofecoxib Other (See Comments)    Unknown    . Rosuvastatin Other (See Comments)    cramps  . Statins Itching and Other (See Comments)    Sleeplessness Sleeplessness  . Sulfa Antibiotics Rash    Social History:   Social History   Socioeconomic History  . Marital status: Widowed    Spouse name: Not on file  . Number of children: 3  . Years of education: 53  . Highest education level: Not on file  Occupational History  . Occupation: Retired  Tobacco Use  . Smoking status: Former Smoker    Packs/day: 0.50    Years: 62.00    Pack years: 31.00    Types: Cigarettes    Quit date: 07/19/2017    Years since quitting: 2.2  . Smokeless tobacco: Never Used  Substance and Sexual Activity  . Alcohol use: No    Alcohol/week: 0.0 standard drinks  . Drug use: No  . Sexual activity: Never    Birth control/protection: Surgical  Other Topics Concern  . Not on file  Social History Narrative   Ok to  share information with medical POA, Son Gabriel Carina   Right-handed   Caffeine: Pepsi   Social Determinants of Health   Financial Resource Strain:   . Difficulty of Paying Living Expenses:   Food Insecurity:   . Worried About Charity fundraiser in the Last Year:   . Arboriculturist in the Last Year:   Transportation Needs:   . Film/video editor (Medical):   Marland Kitchen Lack of Transportation (Non-Medical):   Physical Activity:   . Days of Exercise per Week:   . Minutes of Exercise per Session:   Stress:   . Feeling of Stress :   Social Connections:   . Frequency of Communication with Friends and Family:   . Frequency of Social Gatherings with Friends and Family:   . Attends Religious Services:   . Active Member of Clubs or Organizations:   . Attends Archivist Meetings:   Marland Kitchen Marital Status:   Intimate Partner Violence:   . Fear of Current or Ex-Partner:   . Emotionally Abused:   Marland Kitchen Physically Abused:   . Sexually Abused:     Family History:    Family History  Problem Relation Age of Onset  . CAD Father   . Heart disease Father   . Hyperlipidemia Father   . Heart disease Mother   . Deep vein thrombosis Son   . Hyperlipidemia Other   . Colon cancer Maternal Grandmother   . Cancer Sister        ovarian  .  Diabetes Sister   . Heart disease Sister   . Anesthesia problems Neg Hx   . Hypotension Neg Hx   . Malignant hyperthermia Neg Hx   . Pseudochol deficiency Neg Hx      ROS:  Please see the history of present illness.   All other ROS reviewed and negative.     Physical Exam/Data:   Vitals:   10/22/19 1000 10/22/19 1033 10/22/19 1033 10/22/19 1115  BP: (!) 149/74 (!) 152/78  (!) 142/97  Pulse: 66 65  65  Resp: (!) 27 (!) 29  20  Temp:      TempSrc:      SpO2: 97% 95%  95%  Weight:   84.8 kg   Height:   5\' 5"  (1.651 m)     Intake/Output Summary (Last 24 hours) at 10/22/2019 1135 Last data filed at 10/22/2019 1019 Gross per 24 hour  Intake 417.94 ml    Output 200 ml  Net 217.94 ml   Last 3 Weights 10/22/2019 09/30/2019 09/26/2019  Weight (lbs) 187 lb 180 lb 9.6 oz 180 lb 9.6 oz  Weight (kg) 84.823 kg 81.92 kg 81.92 kg     Body mass index is 31.12 kg/m.  General:  Well nourished, well developed, in no acute distress HEENT: normal Lymph: no adenopathy Neck: no JVD Endocrine:  No thryomegaly Vascular: No carotid bruits; FA pulses 2+ bilaterally without bruits  Cardiac:  normal S1, S2; RRR; no murmur  Lungs:  clear to auscultation bilaterally, no wheezing, rhonchi or rales  Abd: soft, nontender, no hepatomegaly  Ext: no edema Musculoskeletal:  No deformities, BUE and BLE strength normal and equal Skin: warm and dry  Neuro:  CNs 2-12 intact, no focal abnormalities noted Psych:  Normal affect   EKG:  The EKG was personally reviewed and demonstrates:  nsr Telemetry:  Telemetry was personally reviewed and demonstrates:  nsr with frequent PVC's  Relevant CV Studies: ICD interrogation reviewed  Laboratory Data:  High Sensitivity Troponin:   Recent Labs  Lab 10/22/19 0325  TROPONINIHS 32*     Chemistry Recent Labs  Lab 10/22/19 0325  NA 143  K 3.2*  CL 100  CO2 28  GLUCOSE 170*  BUN 20  CREATININE 1.45*  CALCIUM 9.3  GFRNONAA 34*  GFRAA 39*  ANIONGAP 15    Recent Labs  Lab 10/22/19 0325  PROT 6.7  ALBUMIN 3.8  AST 18  ALT 14  ALKPHOS 81  BILITOT 0.6   Hematology Recent Labs  Lab 10/22/19 0325  WBC 10.1  RBC 4.41  HGB 13.9  HCT 43.5  MCV 98.6  MCH 31.5  MCHC 32.0  RDW 17.1*  PLT 273   BNP Recent Labs  Lab 10/22/19 0325  BNP 769.0*    DDimer No results for input(s): DDIMER in the last 168 hours.   Radiology/Studies:  DG Chest Portable 1 View  Result Date: 10/22/2019 CLINICAL DATA:  Initial evaluation for near syncope. EXAM: PORTABLE CHEST 1 VIEW COMPARISON:  Prior radiograph from 09/30/2019 FINDINGS: Left-sided pacemaker/AICD in place. Cardiomegaly, stable. Mediastinal silhouette within  normal limits. Lungs normally inflated. Chronic coarsening of the pulmonary interstitial markings, similar to previous. Superimposed streaky bibasilar opacities felt to be most consistent with subsegmental atelectasis. No consolidative opacity. Mild pulmonary interstitial congestion without overt pulmonary edema. No pleural effusion. No pneumothorax. Few scattered calcified granulomas noted. No acute osseous finding.  Osteopenia. IMPRESSION: 1. Cardiomegaly with mild diffuse pulmonary interstitial congestion, with superimposed mild bibasilar atelectasis. 2. No other active  cardiopulmonary disease. Electronically Signed   By: Jeannine Boga M.D.   On: 10/22/2019 03:42    Assessment and Plan:   1. Recurrent VF - the patient denies feeling getting shocked. I suspect she was essentially unconscous. She has had recurrent VF. She will be admitted and started on IV amiodarone. 2. CAD - she is s/p stenting 4 years ago and I will have her undergo repeat left heart cath. I suspect she has occluded her stents.  3. ICD - her medtronic DDD ICD has been interrogated by me. Her device is functioning normally. No programming changes.   For questions or updates, please contact [place] Please consult [email or web address] for contact info under   Signed, Cristopher Peru, MD  10/22/2019 11:35 AM

## 2019-10-22 NOTE — ED Provider Notes (Signed)
[place] EMERGENCY DEPARTMENT Provider Note   CSN: [id number] Arrival date & time: 10/22/19  0302     History Chief Complaint  Patient presents with  . Irregular Heart Beat    Sydney Phillips is a 84 y.o. female.  Patient with history of CAD status post stents, ischemic cardiomyopathy with AICD in place, COPD, diabetes, hypertension presenting from home with episodes of "dizzy spells".  She reports about 5 episodes of dizzy spells since this evening.  She states she feels lightheaded and feels a "hot sensation" on her waist up and feels like her chest is full air".  This lasted for a few seconds and goes away.  EMS reports she had multiple arrhythmias in route including ventricular tachycardia.  Patient is not aware of receiving any AICD shocks at home.  She denies any chest pain or shortness of breath currently.  She states the dizzy spells come on all of a sudden and not associated with any room spinning.  No focal weakness, numbness or tingling.  No cough or fever.  No leg pain or leg swelling. She states compliance with her medications.  Patient's son [name] reports he witnessed one episode of "seizure" where patient's mouth opened, she was not responsive and her arms and legs shook for about ten seconds. She did not fall or hit her head. She was quickly back to baseline after this.   The history is provided by the patient.       Past Medical History:  Diagnosis Date  . Anxiety   . Arthritis   . Asthma   . Automatic implantable cardioverter-defibrillator in situ   . Bronchitis   . CAD (coronary artery disease)    a. s/p DES to LCx/RCA 05/2016, ostial LAD disease.  . Cardiomyopathy ([place])   . Chronic back pain   . Chronic combined systolic and diastolic CHF (congestive heart failure) ([place])   . Chronic constipation   . Chronic pain   . CKD (chronic kidney disease), stage III   . Colon polyps 2003.  2015.   HP polyps 2003.  adnomas 2015.  required referal  to [facility] for colonoscopic resection of flat polyps.   Marland Kitchen COPD (chronic obstructive pulmonary disease) ([place])   . Dementia ([place])   . Depression   . Depression with anxiety    takes Cymbalta daily  . Diabetes mellitus ([place])   . Dyspnea   . Early cataracts, bilateral   . Fibromyalgia   . GERD (gastroesophageal reflux disease)    was on meds but was taken off;now watches what she eats  . Hemorrhoids   . History of kidney stones   . Hx of colonic polyps   . Hyperlipemia    takes Crestor daily  . Hypertension    takes Amlodipine and Metoprolol daily  . Insomnia   . LBBB (left bundle branch block)    Stress test 09/03/2010, EF 55  . Myocardial infarction ([place])   . PAD (peripheral artery disease) (H[facility])    Carotid, subclavian, and lower extremity beds, currently not symptomatic  . Presence of combination internal cardiac defibrillator (ICD) and pacemaker   . Presence of permanent cardiac pacemaker   . Pulmonary hypertension ([place])   . S/P angioplasty with stent, lt. subclavian 07/31/11 08/01/2011  . Subclavian arterial stenosis, lt, with PTA/STENT 07/31/11 08/01/2011  . Syncope 07/28/2011   EF - 50-55, moderate concentric hypertrophy in left ventricle  . Urinary incontinence   . Vertigo    takes Meclizine  prn    Patient Active Problem List   Diagnosis Date Noted  . Type 2 diabetes mellitus with diabetic chronic kidney disease ([place]) 01/05/2019  . Diabetic foot ulcers ([place]) 06/18/2018  . Rhinitis, chronic 09/29/2017  . Hearing loss 09/04/2017  . Perforation of left tympanic membrane 09/04/2017  . Acute exacerbation of CHF (congestive heart failure) ([place]) 07/19/2017  . Chronic combined systolic and diastolic heart failure ([place]) 07/19/2017  . Acute on chronic respiratory failure with hypoxia ([place]) 07/19/2017  . Poor dentition 04/01/2017  . Tobacco abuse 12/17/2016  . 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 Chronic respiratory failure with hypoxia ([place])   . Acute  encephalopathy 05/30/2016  . CKD (chronic kidney disease), stage III ([place]) 05/30/2016  . Leukocytosis   . Stenosis of left subclavian artery ([place]) 05/19/2016  . Somnolence   . OSA and COPD overlap syndrome ([place])   . Cardiomyopathy, ischemic   . Ischemic mitral valve regurgitation   . Pulmonary hypertension ([place])   . CAD S/P percutaneous coronary angioplasty   . Altered mental state 02/26/2016  . Arthralgia 10/12/2015  . Left carotid stenosis 11/08/2014  . Iron deficiency anemia 04/17/2014  . Carotid artery disease ([place]) 04/17/2014  . LBBB (left bundle branch block) 04/16/2014  . Diastolic dysfunction [id number]  . NSTEMI (non-ST elevated myocardial infarction) ([place]) 01/20/2014  . Melena 01/20/2014  . Microcytic anemia 01/19/2014  . Colon polyp 03/24/2013  . S/P angioplasty with stent, lt. subclavian 07/31/11 08/01/2011  . Implantable cardioverter-defibrillator (ICD) in situ 07/28/2011  . PVD, 07/28/2011  . Vertigo 07/27/2011  . Hypokalemia 07/27/2011  . SPINAL STENOSIS 01/28/2010  . Chronic constipation 01/14/2010  . Insomnia 01/23/2009  . HIP PAIN, BILATERAL 06/21/2008  . Coronary atherosclerosis 03/30/2008  . Other primary cardiomyopathies 03/30/2008  . Myalgia and myositis 11/26/2007  . Other and unspecified hyperlipidemia 07/28/2007  . WEIGHT GAIN 06/04/2007  . COPD (chronic obstructive pulmonary disease) ([place]) 03/02/2007  . Depression with anxiety 02/25/2007  . Hypertension 02/25/2007  . GERD 02/25/2007  . COLONIC POLYPS, HX OF 02/25/2007    Past Surgical History:  Procedure Laterality Date  . ABDOMINAL AORTAGRAM N/A 08/12/2013   Procedure: ABDOMINAL Maxcine Ham;  Surgeon: Elam Dutch, MD;  Location: [facility] CATH LAB;  Service: Cardiovascular;  Laterality: N/A;  . ABDOMINAL HYSTERECTOMY    . APPENDECTOMY    . BACK SURGERY  2012  . BIV ICD GENERTAOR CHANGE OUT Left 02/20/2012   Procedure: BIV ICD GENERTAOR CHANGE OUT;  Surgeon: [name], MD;  Location: [facility] CATH  LAB;  Service: Cardiovascular;  Laterality: Left;  . CARDIAC CATHETERIZATION  12/01/2007   By Dr. Melvern Banker, left heart cath,   . CARDIAC CATHETERIZATION N/A 05/20/2016   Procedure: Right/Left Heart Cath and Coronary Angiography;  Surgeon: Sherren Mocha, MD;  Location: [place] CV LAB;  Service: Cardiovascular;  Laterality: N/A;  . CARDIAC CATHETERIZATION N/A 05/20/2016   Procedure: Coronary Stent Intervention;  Surgeon: Sherren Mocha, MD;  Location: [place] CV LAB;  Service: Cardiovascular;  Laterality: N/A;  . CARDIAC DEFIBRILLATOR PLACEMENT  05/2008   By Dr Blanch Media, [employer] CANNOT HAVE MRI's  . CAROTID ANGIOGRAM N/A 07/31/2011   Procedure: CAROTID ANGIOGRAM;  Surgeon: Lorretta Harp, MD;  Location: [facility] CATH LAB;  Service: Cardiovascular;  Laterality: N/A;  carotid angiogram and possible Lt SCA PTA  . COLONOSCOPY W/ POLYPECTOMY  12/2013  . CORONARY ANGIOPLASTY    . ENDARTERECTOMY Left 11/08/2014   Procedure: LEFT CAROTID ENDARTERECTOMY WITH HEMASHIELD PATCH ANGIOPLASTY;  Surgeon:  Elam Dutch, MD;  Location: [facility] OR;  Service: Vascular;  Laterality: Left;  . ESOPHAGOGASTRODUODENOSCOPY N/A 01/20/2014   Procedure: ESOPHAGOGASTRODUODENOSCOPY (EGD);  Surgeon: Jerene Bears, MD;  Location: [facility] ENDOSCOPY;  Service: Endoscopy;  Laterality: N/A;  . FEMORAL-POPLITEAL BYPASS GRAFT Right 10/12/2013   Procedure:   Femoral-Peroneal trunk  bypass with nonreversed greater saphenous vein graft;  Surgeon: Elam Dutch, MD;  Location: [place];  Service: Vascular;  Laterality: Right;  . GIVENS CAPSULE STUDY N/A 01/20/2014   Procedure: GIVENS CAPSULE STUDY;  Surgeon: Jerene Bears, MD;  Location: [place];  Service: Gastroenterology;  Laterality: N/A;  . ICD GENERATOR CHANGEOUT N/A 03/04/2017   Procedure: ICD Generator Changeout;  Surgeon: [name], MD;  Location: [place] CV LAB;  Service: Cardiovascular;  Laterality: N/A;  . INSERT / REPLACE / REMOVE PACEMAKER    . INTRAOPERATIVE ARTERIOGRAM  Right 10/12/2013   Procedure: INTRA OPERATIVE ARTERIOGRAM;  Surgeon: Elam Dutch, MD;  Location: [place];  Service: Vascular;  Laterality: Right;  . ORIF ELBOW FRACTURE  08/16/2011   Procedure: OPEN REDUCTION INTERNAL FIXATION (ORIF) ELBOW/OLECRANON FRACTURE;  Surgeon: Schuyler Amor, MD;  Location: [place];  Service: Orthopedics;  Laterality: Left;  . RENAL ANGIOGRAM N/A 08/12/2013   Procedure: RENAL ANGIOGRAM;  Surgeon: Elam Dutch, MD;  Location[facility] CATH LAB;  Service: Cardiovascular;  Laterality: N/A;  . SUBCLAVIAN STENT PLACEMENT Left 07/31/2011   7x18 Genesis, balloon, with reduction of 90% ostial left subclavian artery stenosis to 0% with residual excellent flow  . TONSILLECTOMY    . TUBAL LIGATION       OB History   No obstetric history on file.     Family History  Problem Relation Age of Onset  . CAD Father   . Heart disease Father   . Hyperlipidemia Father   . Heart disease Mother   . Deep vein thrombosis Son   . Hyperlipidemia Other   . Colon cancer Maternal Grandmother   . Cancer Sister        ovarian  . Diabetes Sister   . Heart disease Sister   . Anesthesia problems Neg Hx   . Hypotension Neg Hx   . Malignant hyperthermia Neg Hx   . Pseudochol deficiency Neg Hx     Social History   Tobacco Use  . Smoking status: Former Smoker    Packs/day: 0.50    Years: 62.00    Pack years: 31.00    Types: Cigarettes    Quit date: 07/19/2017    Years since quitting: 2.2  . Smokeless tobacco: Never Used  Substance Use Topics  . Alcohol use: No    Alcohol/week: 0.0 standard drinks  . Drug use: No    Home Medications Prior to Admission medications   Medication Sig Start Date End Date Taking? Authorizing Provider  ACCU-CHEK FASTCLIX LANCETS MISC USE   TO CHECK GLUCOSE ONCE DAILY 07/27/18   Laurey Morale, MD  ACCU-CHEK SMARTVIEW test strip USE  STRIP TO CHECK GLUCOSE ONCE DAILY 06/15/19   Laurey Morale, MD  albuterol (PROVENTIL) (2.5 MG/3ML) 0.083% nebulizer  solution USE 1 VIAL IN NEBULIZER EVERY 4 HOURS AS NEEDED FOR WHEEZING FOR SHORTNESS OF BREATH 05/18/19   Laurey Morale, MD  allopurinol (ZYLOPRIM) 300 MG tablet Take 1 tablet (300 mg total) by mouth daily. 11/12/18   Laurey Morale, MD  ALPRAZolam Duanne Moron) 1 MG tablet TAKE 1 TABLET BY MOUTH IN THE MORNING AND 1 & 1/2 (ONE &  ONE-HALF) NIGHTLY AS NEEDED FOR ANXIETY 05/11/19   Laurey Morale, MD  amiodarone (PACERONE) 200 MG tablet Take 2 tablets (400 mg total) TWICE a day for 2 weeks. Then reduce to 1 tablet (200 mg total) TWICE a day for 2 weeks. Then reduce to 1 tablet ONCE a day. 09/28/19   Camnitz, Ocie Doyne, MD  amiodarone (PACERONE) 200 MG tablet Take 1 tablet (200 mg total) by mouth daily. 09/28/19   Camnitz, Ocie Doyne, MD  aspirin 81 MG chewable tablet Chew 1 tablet (81 mg total) by mouth daily. 05/23/16   Reyne Dumas, MD  azelastine (ASTELIN) 0.1 % nasal spray USE 2 SPRAY(S) IN EACH NOSTRIL TWICE DAILY AS DIRECTED 08/08/19   Laurey Morale, MD  bisoprolol (ZEBETA) 5 MG tablet Take 1 tablet (5 mg total) by mouth daily. 09/28/19   Camnitz, Ocie Doyne, MD  cholecalciferol (VITAMIN D) 1000 units tablet Take 1,000 Units by mouth daily.    [provider]  clopidogrel (PLAVIX) 75 MG tablet Take 1 tablet by mouth once daily 06/30/19   Laurey Morale, MD  cyanocobalamin 1000 MCG tablet Take 1,000 mcg by mouth daily.    [provider]  DULoxetine (CYMBALTA) 60 MG capsule Take 1 capsule (60 mg total) by mouth 2 (two) times daily. 01/05/19   Laurey Morale, MD  furosemide (LASIX) 80 MG tablet TAKE 2 TABLETS BY MOUTH IN THE MORNING AND 2 IN THE EVENING 01/05/19   Laurey Morale, MD  Magnesium Oxide 400 MG CAPS Take 1 capsule (400 mg total) by mouth daily. 07/15/19   Croitoru, Mihai, MD  nitroGLYCERIN (NITROSTAT) 0.4 MG SL tablet Place 0.4 mg under the tongue every 5 (five) minutes as needed for chest pain. X 3 doses    [provider]  potassium chloride SA (K-DUR) 20 MEQ tablet  Take 2 tablets (40 mEq total) by mouth 2 (two) times daily. 11/12/18   Laurey Morale, MD  PROAIR HFA [phone] Base) MCG/ACT inhaler INHALE 2 PUFFS BY MOUTH EVERY 4 HOURS AS NEEDED FOR WHEEZING AND FOR SHORTNESS OF BREATH 08/08/19   Laurey Morale, MD    Allergies    Potassium-containing compounds, Neomycin-polymyxin b gu, Other, Azithromycin, Codeine, Darvon, Erythromycin, Meloxicam, Norco [hydrocodone-acetaminophen], Penicillins, Propoxyphene n-acetaminophen, Rofecoxib, Rosuvastatin, Statins, and Sulfa antibiotics  Review of Systems   Review of Systems  Constitutional: Negative for activity change, appetite change, fatigue and fever.  HENT: Negative for congestion and rhinorrhea.   Respiratory: Negative for chest tightness and shortness of breath.   Cardiovascular: Positive for palpitations. Negative for chest pain.  Gastrointestinal: Negative for abdominal pain, nausea and vomiting.  Genitourinary: Negative for dysuria and hematuria.  Musculoskeletal: Negative for arthralgias and myalgias.  Skin: Negative for rash.  Neurological: Positive for dizziness, syncope and light-headedness.   all other systems are negative except as noted in the HPI and PMH.    Physical Exam Updated Vital Signs BP 133/76 (BP Location: Right Arm)   Pulse 73   Temp 98.3 F (36.8 C) (Oral)   Resp 18   SpO2 92%   Physical Exam Vitals and nursing note reviewed.  Constitutional:      General: She is not in acute distress.    Appearance: Normal appearance. She is well-developed. She is obese.  HENT:     Head: Normocephalic and atraumatic.     Mouth/Throat:     Pharynx: No oropharyngeal exudate.  Eyes:     Conjunctiva/sclera: Conjunctivae normal.  Pupils: Pupils are equal, round, and reactive to light.  Neck:     Comments: No meningismus. Cardiovascular:     Rate and Rhythm: Normal rate and regular rhythm.     Heart sounds: Normal heart sounds. No murmur.     Comments: AICD in place Pulmonary:      Effort: Pulmonary effort is normal. No respiratory distress.     Breath sounds: Normal breath sounds. No wheezing.  Abdominal:     Palpations: Abdomen is soft.     Tenderness: There is no abdominal tenderness. There is no guarding or rebound.  Musculoskeletal:        General: No tenderness. Normal range of motion.     Cervical back: Normal range of motion and neck supple.  Skin:    General: Skin is warm.     Capillary Refill: Capillary refill takes less than 2 seconds.  Neurological:     General: No focal deficit present.     Mental Status: She is alert and oriented to person, place, and time. Mental status is at baseline.     Cranial Nerves: No cranial nerve deficit.     Motor: No abnormal muscle tone.     Coordination: Coordination normal.     Comments:  5/5 strength throughout. CN 2-12 intact.Equal grip strength.   Psychiatric:        Behavior: Behavior normal.     ED Results / Procedures / Treatments   Labs (all labs ordered are listed, but only abnormal results are displayed) Labs Reviewed  BASIC METABOLIC PANEL - Abnormal; Notable for the following components:      Result Value   Potassium 3.2 (*)    Glucose, Bld 170 (*)    Creatinine, Ser 1.45 (*)    GFR calc non Af Amer 34 (*)    GFR calc Af Amer 39 (*)    All other components within normal limits  CBC - Abnormal; Notable for the following components:   RDW 17.1 (*)    All other components within normal limits  BRAIN NATRIURETIC PEPTIDE - Abnormal; Notable for the following components:   B Natriuretic Peptide 769.0 (*)    All other components within normal limits  TROPONIN I (HIGH SENSITIVITY) - Abnormal; Notable for the following components:   Troponin I (High Sensitivity) 32 (*)    All other components within normal limits  SARS CORONAVIRUS 2 (TAT 6-24 HRS)  HEPATIC FUNCTION PANEL  LIPASE, BLOOD  MAGNESIUM  BASIC METABOLIC PANEL  TSH  TROPONIN I (HIGH SENSITIVITY)    EKG EKG  Interpretation  Date/Time:  Saturday Oct 22 2019 03:46:02 EDT Ventricular Rate:  75 PR Interval:  130 QRS Duration: 148 QT Interval:  488 QTC Calculation: 546 R Axis:   -95 Text Interpretation: Sinus rhythm Nonspecific IVCD with LAD Anterolateral infarct, age indeterminate No significant change was found Confirmed by Ezequiel Essex [phone]) on 10/22/2019 4:01:47 AM   Radiology DG Chest Portable 1 View  Result Date: 10/22/2019 CLINICAL DATA:  Initial evaluation for near syncope. EXAM: PORTABLE CHEST 1 VIEW COMPARISON:  Prior radiograph from 09/30/2019 FINDINGS: Left-sided pacemaker/AICD in place. Cardiomegaly, stable. Mediastinal silhouette within normal limits. Lungs normally inflated. Chronic coarsening of the pulmonary interstitial markings, similar to previous. Superimposed streaky bibasilar opacities felt to be most consistent with subsegmental atelectasis. No consolidative opacity. Mild pulmonary interstitial congestion without overt pulmonary edema. No pleural effusion. No pneumothorax. Few scattered calcified granulomas noted. No acute osseous finding.  Osteopenia. IMPRESSION: 1. Cardiomegaly with mild diffuse  pulmonary interstitial congestion, with superimposed mild bibasilar atelectasis. 2. No other active cardiopulmonary disease. Electronically Signed   By: Jeannine Boga M.D.   On: 10/22/2019 03:42    Procedures .Critical Care Performed by: Ezequiel Essex, MD Authorized by: Ezequiel Essex, MD   Critical care provider statement:    Critical care time (minutes):  45   Critical care was necessary to treat or prevent imminent or life-threatening deterioration of the following conditions:  Cardiac failure   Critical care was time spent personally by me on the following activities:  Discussions with consultants, evaluation of patient's response to treatment, examination of patient, ordering and performing treatments and interventions, ordering and review of laboratory studies,  ordering and review of radiographic studies, pulse oximetry, re-evaluation of patient's condition, obtaining history from patient or surrogate and review of old charts   (including critical care time)  Medications Ordered in ED Medications  sodium chloride flush (NS) 0.9 % injection 3 mL (has no administration in time range)    ED Course  I have reviewed the triage vital signs and the nursing notes.  Pertinent labs & imaging results that were available during my care of the patient were reviewed by me and considered in my medical decision making (see chart for details).    MDM Rules/Calculators/A&P                      Patient from home with AICD shocks and episodes of VF. Alert on arrival denies CP or SOB. Appears mildly volume overloaded. Takes 160 mg Lasix daily.  Labs with K of 3.2. Magnesium normal.  Start IV amiodarone gtt.   Interrogation of pacemaker shows that patient has had 7 arrhythmia events since April 12.  In the past 24 hours she took 2 shocks for ventricular fibrillation at 139 and 209.  D/w Dr. Valrie Hart of cardiology. He will see patient. Agrees with electrolyte replacement and IV amiodarone.  He does not feel her CHF is significant enough to cause arrhythmias at this time.  IV lasix given at low dose. May need further potassium replacement.   Cardiology to admit patient.  Final Clinical Impression(s) / ED Diagnoses Final diagnoses:  Ventricular fibrillation [facility])    Rx / DC Orders ED Discharge Orders    None       Selia Wareing, Annie Main, MD 10/22/19 [phone]

## 2019-10-22 NOTE — Plan of Care (Signed)

## 2019-10-22 NOTE — H&P (Addendum)
Cardiology Admission History and Physical:   Patient ID: Sydney Phillips MRN: [id number]; DOB: 01/11/1936   Admission date: 10/22/2019  Primary Care Provider: Laurey Morale, MD Primary Cardiologist: [name], MD  Primary Electrophysiologist:  [name], MD   Chief Complaint:  Syncope and ICD discharge  Patient Profile:   Sydney Phillips is a 84 y.o. female with CAD s/p PCI, ischemic CM, PAD, HFrEF (XX%), CKD II, severe COPD, HTN, DM2, prior VT/VF s/p ICD, OSA, HLD, active smoking, and pHTN presenting with syncope and ICD discharge.   History of Present Illness:   History is obtained with the assistance of the patient's son, who is at the bedside.   Sydney Phillips describes several episodes of ICD discharges over the past few weeks. She believes she has been shocked 8 or 9 times in 3 weeks. This has worsened lately, with her having 2 shocks on the day of presentation. Both were without clear precipitating event. She had no trauma associated with these shocks. Her son was with her during at least 1 of these and thought she was having a seizure. States she felt it come on and then he saw her start to move her arms and legs erratically then lose consciousness. After about 10 seconds she woke up and did not remember the episode. Because of this the patient was brought to the ED for evaluation.   The patient states she has been taking all of her medications normally without missed doses. She has chronic shortness of breath due to her severe underlying COPD and she uses oxygen at home. He denies chest pain, chest pressure, nausea, vomiting. She has no fevers, chills. She is an active every day smoker.   In the ED, the patient's ICD was interrogated and revealed multiple treated episodes of VT/VF as well as multiple monitored episodes of fast VT. Her potassium was found to be 3.2. She was started on IV potassium, IV amiodarone, was given IV lasix, and cardiology was consulted for  further management.   Heart Pathway Score:     Past Medical History:  Diagnosis Date  . Anxiety   . Arthritis   . Asthma   . Automatic implantable cardioverter-defibrillator in situ   . Bronchitis   . CAD (coronary artery disease)    a. s/p DES to LCx/RCA 05/2016, ostial LAD disease.  . Cardiomyopathy ([place])   . Chronic back pain   . Chronic combined systolic and diastolic CHF (congestive heart failure) ([place])   . Chronic constipation   . Chronic pain   . CKD (chronic kidney disease), stage III   . Colon polyps 2003.  2015.   HP polyps 2003.  adnomas 2015.  required referal to [facility] for colonoscopic resection of flat polyps.   Marland Kitchen COPD (chronic obstructive pulmonary disease) ([place])   . Dementia ([place])   . Depression   . Depression with anxiety    takes Cymbalta daily  . Diabetes mellitus ([place])   . Dyspnea   . Early cataracts, bilateral   . Fibromyalgia   . GERD (gastroesophageal reflux disease)    was on meds but was taken off;now watches what she eats  . Hemorrhoids   . History of kidney stones   . Hx of colonic polyps   . Hyperlipemia    takes Crestor daily  . Hypertension    takes Amlodipine and Metoprolol daily  . Insomnia   . LBBB (left bundle branch block)    Stress test 09/03/2010, EF 55  .  Myocardial infarction ([place])   . PAD (peripheral artery disease) ([facility])    Carotid, subclavian, and lower extremity beds, currently not symptomatic  . Presence of combination internal cardiac defibrillator (ICD) and pacemaker   . Presence of permanent cardiac pacemaker   . Pulmonary hypertension ([place])   . S/P angioplasty with stent, lt. subclavian 07/31/11 08/01/2011  . Subclavian arterial stenosis, lt, with PTA/STENT 07/31/11 08/01/2011  . Syncope 07/28/2011   EF - 50-55, moderate concentric hypertrophy in left ventricle  . Urinary incontinence   . Vertigo    takes Meclizine prn    Past Surgical History:  Procedure Laterality Date  . ABDOMINAL AORTAGRAM N/A 08/12/2013    Procedure: ABDOMINAL Maxcine Ham;  Surgeon: Elam Dutch, MD;  Location: [facility] CATH LAB;  Service: Cardiovascular;  Laterality: N/A;  . ABDOMINAL HYSTERECTOMY    . APPENDECTOMY    . BACK SURGERY  2012  . BIV ICD GENERTAOR CHANGE OUT Left 02/20/2012   Procedure: BIV ICD GENERTAOR CHANGE OUT;  Surgeon: [name], MD;  Location: [facility] CATH LAB;  Service: Cardiovascular;  Laterality: Left;  . CARDIAC CATHETERIZATION  12/01/2007   By Dr. Melvern Banker, left heart cath,   . CARDIAC CATHETERIZATION N/A 05/20/2016   Procedure: Right/Left Heart Cath and Coronary Angiography;  Surgeon: Sherren Mocha, MD;  Location: [place] CV LAB;  Service: Cardiovascular;  Laterality: N/A;  . CARDIAC CATHETERIZATION N/A 05/20/2016   Procedure: Coronary Stent Intervention;  Surgeon: Sherren Mocha, MD;  Location: [place] CV LAB;  Service: Cardiovascular;  Laterality: N/A;  . CARDIAC DEFIBRILLATOR PLACEMENT  05/2008   By Dr Blanch Media, [employer] CANNOT HAVE MRI's  . CAROTID ANGIOGRAM N/A 07/31/2011   Procedure: CAROTID ANGIOGRAM;  Surgeon: Lorretta Harp, MD;  Location: [facility] CATH LAB;  Service: Cardiovascular;  Laterality: N/A;  carotid angiogram and possible Lt SCA PTA  . COLONOSCOPY W/ POLYPECTOMY  12/2013  . CORONARY ANGIOPLASTY    . ENDARTERECTOMY Left 11/08/2014   Procedure: LEFT CAROTID ENDARTERECTOMY WITH HEMASHIELD PATCH ANGIOPLASTY;  Surgeon: Elam Dutch, MD;  Location: [place];  Service: Vascular;  Laterality: Left;  . ESOPHAGOGASTRODUODENOSCOPY N/A 01/20/2014   Procedure: ESOPHAGOGASTRODUODENOSCOPY (EGD);  Surgeon: Jerene Bears, MD;  Location: [facility] ENDOSCOPY;  Service: Endoscopy;  Laterality: N/A;  . FEMORAL-POPLITEAL BYPASS GRAFT Right 10/12/2013   Procedure:   Femoral-Peroneal trunk  bypass with nonreversed greater saphenous vein graft;  Surgeon: Elam Dutch, MD;  Location: [place];  Service: Vascular;  Laterality: Right;  . GIVENS CAPSULE STUDY N/A 01/20/2014   Procedure: GIVENS CAPSULE STUDY;  Surgeon: Jerene Bears, MD;  Location: [place];  Service: Gastroenterology;  Laterality: N/A;  . ICD GENERATOR CHANGEOUT N/A 03/04/2017   Procedure: ICD Generator Changeout;  Surgeon: [name], MD;  Location: [place] CV LAB;  Service: Cardiovascular;  Laterality: N/A;  . INSERT / REPLACE / REMOVE PACEMAKER    . INTRAOPERATIVE ARTERIOGRAM Right 10/12/2013   Procedure: INTRA OPERATIVE ARTERIOGRAM;  Surgeon: Elam Dutch, MD;  Location: [place];  Service: Vascular;  Laterality: Right;  . ORIF ELBOW FRACTURE  08/16/2011   Procedure: OPEN REDUCTION INTERNAL FIXATION (ORIF) ELBOW/OLECRANON FRACTURE;  Surgeon: Schuyler Amor, MD;  Location: [place];  Service: Orthopedics;  Laterality: Left;  . RENAL ANGIOGRAM N/A 08/12/2013   Procedure: RENAL ANGIOGRAM;  Surgeon: Elam Dutch, MD;  Location: [facility] CATH LAB;  Service: Cardiovascular;  Laterality: N/A;  . SUBCLAVIAN STENT PLACEMENT Left 07/31/2011   7x18 Genesis, balloon, with reduction of 90% ostial left  subclavian artery stenosis to 0% with residual excellent flow  . TONSILLECTOMY    . TUBAL LIGATION       Medications Prior to Admission: Prior to Admission medications   Medication Sig Start Date End Date Taking? Authorizing Provider  albuterol (PROVENTIL) (2.5 MG/3ML) 0.083% nebulizer solution USE 1 VIAL IN NEBULIZER EVERY 4 HOURS AS NEEDED FOR WHEEZING FOR SHORTNESS OF BREATH Patient taking differently: Take 2.5 mg by nebulization every 4 (four) hours as needed for wheezing.  05/18/19  Yes Laurey Morale, MD  allopurinol (ZYLOPRIM) 300 MG tablet Take 1 tablet (300 mg total) by mouth daily. 11/12/18  Yes Laurey Morale, MD  ALPRAZolam Duanne Moron) 1 MG tablet TAKE 1 TABLET BY MOUTH IN THE MORNING AND 1 & 1/2 (ONE & ONE-HALF) NIGHTLY AS NEEDED FOR ANXIETY Patient taking differently: Take 1.5 mg by mouth at bedtime as needed for sleep.  05/11/19  Yes Laurey Morale, MD  aspirin 81 MG chewable tablet Chew 1 tablet (81 mg total) by mouth daily. 05/23/16  Yes  Reyne Dumas, MD  azelastine (ASTELIN) 0.1 % nasal spray USE 2 SPRAY(S) IN EACH NOSTRIL TWICE DAILY AS DIRECTED Patient taking differently: Place 2 sprays into both nostrils 2 (two) times daily.  08/08/19  Yes Laurey Morale, MD  bisoprolol (ZEBETA) 5 MG tablet Take 1 tablet (5 mg total) by mouth daily. 09/28/19  Yes Camnitz, Ocie Doyne, MD  cholecalciferol (VITAMIN D) 1000 units tablet Take 1,000 Units by mouth daily.   Yes [provider]  clopidogrel (PLAVIX) 75 MG tablet Take 1 tablet by mouth once daily 06/30/19  Yes Laurey Morale, MD  cyanocobalamin 1000 MCG tablet Take 1,000 mcg by mouth daily.   Yes [provider]  DULoxetine (CYMBALTA) 60 MG capsule Take 1 capsule (60 mg total) by mouth 2 (two) times daily. 01/05/19  Yes Laurey Morale, MD  furosemide (LASIX) 80 MG tablet TAKE 2 TABLETS BY MOUTH IN THE MORNING AND 2 IN THE EVENING Patient taking differently: Take 160 mg by mouth 2 (two) times daily.  01/05/19  Yes Laurey Morale, MD  nitroGLYCERIN (NITROSTAT) 0.4 MG SL tablet Place 0.4 mg under the tongue every 5 (five) minutes as needed for chest pain. X 3 doses   Yes [provider]  potassium chloride SA (K-DUR) 20 MEQ tablet Take 2 tablets (40 mEq total) by mouth 2 (two) times daily. 11/12/18  Yes Laurey Morale, MD  PROAIR HFA 108 [phone] Base) MCG/ACT inhaler INHALE 2 PUFFS BY MOUTH EVERY 4 HOURS AS NEEDED FOR WHEEZING AND FOR SHORTNESS OF BREATH Patient taking differently: Inhale 2 puffs into the lungs every 4 (four) hours as needed for wheezing.  08/08/19  Yes Laurey Morale, MD  ACCU-CHEK FASTCLIX LANCETS MISC USE   TO CHECK GLUCOSE ONCE DAILY 07/27/18   Laurey Morale, MD  ACCU-CHEK SMARTVIEW test strip USE  STRIP TO CHECK GLUCOSE ONCE DAILY 06/15/19   Laurey Morale, MD  amiodarone (PACERONE) 200 MG tablet Take 2 tablets (400 mg total) TWICE a day for 2 weeks. Then reduce to 1 tablet (200 mg total) TWICE a day for 2 weeks. Then reduce to 1 tablet ONCE a  day. Patient not taking: Reported on 10/22/2019 09/28/19   [name], MD  amiodarone (PACERONE) 200 MG tablet Take 1 tablet (200 mg total) by mouth daily. Patient not taking: Reported on 10/22/2019 09/28/19   [name], MD  Magnesium Oxide 400 MG CAPS Take  1 capsule (400 mg total) by mouth daily. 07/15/19   Croitoru, Dani Gobble, MD     Allergies:    Allergies  Allergen Reactions  . Potassium-Containing Compounds Other (See Comments)    Causes severe constipation  . Neomycin-Polymyxin B Gu     Swollen throat   . Other Other (See Comments)    "mycin" antibiotics cause sleeplessness and itching  . Azithromycin Rash  . Codeine Itching  . Darvon Itching  . Erythromycin Rash  . Meloxicam Other (See Comments)    Unknown    . Norco [Hydrocodone-Acetaminophen] Itching  . Penicillins Rash and Other (See Comments)    Has patient had a PCN reaction causing immediate rash, facial/tongue/throat swelling, SOB or lightheadedness with hypotension: unknown Has patient had a PCN reaction causing severe rash involving mucus membranes or skin necrosis: no Has patient had a PCN reaction that required hospitalization: unknown Has patient had a PCN reaction occurring within the last 10 years: no If all of the above answers are "NO", then may proceed with Cephalosporin use.   Marland Kitchen Propoxyphene N-Acetaminophen Itching  . Rofecoxib Other (See Comments)    Unknown    . Rosuvastatin Other (See Comments)    cramps  . Statins Itching and Other (See Comments)    Sleeplessness Sleeplessness  . Sulfa Antibiotics Rash    Social History:   Social History   Socioeconomic History  . Marital status: Widowed    Spouse name: Not on file  . Number of children: 3  . Years of education: 86  . Highest education level: Not on file  Occupational History  . Occupation: Retired  Tobacco Use  . Smoking status: Former Smoker    Packs/day: 0.50    Years: 62.00    Pack years: 31.00    Types: Cigarettes     Quit date: 07/19/2017    Years since quitting: 2.2  . Smokeless tobacco: Never Used  Substance and Sexual Activity  . Alcohol use: No    Alcohol/week: 0.0 standard drinks  . Drug use: No  . Sexual activity: Never    Birth control/protection: Surgical  Other Topics Concern  . Not on file  Social History Narrative   Ok to share information with medical POA, Son Sydney Phillips   Right-handed   Caffeine: Pepsi   Social Determinants of Health   Financial Resource Strain:   . Difficulty of Paying Living Expenses:   Food Insecurity:   . Worried About Charity fundraiser in the Last Year:   . Arboriculturist in the Last Year:   Transportation Needs:   . Film/video editor (Medical):   Marland Kitchen Lack of Transportation (Non-Medical):   Physical Activity:   . Days of Exercise per Week:   . Minutes of Exercise per Session:   Stress:   . Feeling of Stress :   Social Connections:   . Frequency of Communication with Friends and Family:   . Frequency of Social Gatherings with Friends and Family:   . Attends Religious Services:   . Active Member of Clubs or Organizations:   . Attends Archivist Meetings:   Marland Kitchen Marital Status:   Intimate Partner Violence:   . Fear of Current or Ex-Partner:   . Emotionally Abused:   Marland Kitchen Physically Abused:   . Sexually Abused:     Family History:   The patient's family history includes CAD in her father; Cancer in her sister; Colon cancer in her maternal grandmother; Deep vein thrombosis  in her son; Diabetes in her sister; Heart disease in her father, mother, and sister; Hyperlipidemia in her father and another family member. There is no history of Anesthesia problems, Hypotension, Malignant hyperthermia, or Pseudochol deficiency.    ROS:  Please see the history of present illness.  All other ROS reviewed and negative.     Physical Exam/Data:   Vitals:   10/22/19 0430 10/22/19 0445 10/22/19 0515 10/22/19 0530  BP: (!) 153/73 (!) 158/84 (!) 152/90  (!) 133/99  Pulse: 69 69 70 70  Resp: (!) 23 (!) 27 (!) 22 17  Temp:      TempSrc:      SpO2: 94% 96% 96% 96%   No intake or output data in the 24 hours ending 10/22/19 0610 Last 3 Weights 09/30/2019 09/26/2019 09/13/2019  Weight (lbs) 180 lb 9.6 oz 180 lb 9.6 oz 177 lb  Weight (kg) 81.92 kg 81.92 kg 80.287 kg     There is no height or weight on file to calculate BMI.  General:  Elderly, frail, very hard of hearing HEENT: MMM Lymph: no adenopathy Neck: JVP elevated to ~7cm H2O Endocrine:  No thryomegaly  Cardiac:  Very distant heart sounds, no appreciable r/m/g Lungs:  Course lung sounds bilaterally Abd: soft, nontender, no hepatomegaly  Ext: no edema, cool extremities Skin: warm and dry  Psych:  Normal affect    EKG:  The ECG that was done on arrival to the ED was personally reviewed and demonstrates sinus rhythm with biventricular pacing.   Relevant CV Studies: 08/24/19 SPECT  Nuclear stress EF: 21%.  The left ventricular ejection fraction is severely decreased (<30%).  NSR with Vpaced rhythm noted throughout study with occasional PVCs.  There is a small defect of moderate severity present in the apex location. The defect is non-reversible. There is no ischemia.  This is a high risk study due to LV dysfunction.  Compared to nuclear study of 2016, severe LV dilatation and dysfunction are now present.   08/24/19 Echo 1. Left ventricular ejection fraction, by estimation, is 20 to 25%. The  left ventricle has severely decreased function. The left ventricle  demonstrates global hypokinesis. The left ventricular internal cavity size  was mildly dilated. Left ventricular  diastolic parameters are consistent with Grade II diastolic dysfunction  (pseudonormalization).  2. Right ventricular systolic function is normal. The right ventricular  size is normal. There is moderately elevated pulmonary artery systolic  pressure.  3. The mitral valve is normal in structure.  Moderate mitral valve  regurgitation.  4. Tricuspid valve regurgitation is mild to moderate.  5. The aortic valve is normal in structure. Aortic valve regurgitation is  trivial. No aortic stenosis is present.   Laboratory Data:  High Sensitivity Troponin:   Recent Labs  Lab 10/22/19 0325  TROPONINIHS 32*      Chemistry Recent Labs  Lab 10/22/19 0325  NA 143  K 3.2*  CL 100  CO2 28  GLUCOSE 170*  BUN 20  CREATININE 1.45*  CALCIUM 9.3  GFRNONAA 34*  GFRAA 39*  ANIONGAP 15    Recent Labs  Lab 10/22/19 0325  PROT 6.7  ALBUMIN 3.8  AST 18  ALT 14  ALKPHOS 81  BILITOT 0.6   Hematology Recent Labs  Lab 10/22/19 0325  WBC 10.1  RBC 4.41  HGB 13.9  HCT 43.5  MCV 98.6  MCH 31.5  MCHC 32.0  RDW 17.1*  PLT 273   BNP Recent Labs  Lab 10/22/19 0325  BNP 769.0*    DDimer No results for input(s): DDIMER in the last 168 hours.   Radiology/Studies:  DG Chest Portable 1 View  Result Date: 10/22/2019 CLINICAL DATA:  Initial evaluation for near syncope. EXAM: PORTABLE CHEST 1 VIEW COMPARISON:  Prior radiograph from 09/30/2019 FINDINGS: Left-sided pacemaker/AICD in place. Cardiomegaly, stable. Mediastinal silhouette within normal limits. Lungs normally inflated. Chronic coarsening of the pulmonary interstitial markings, similar to previous. Superimposed streaky bibasilar opacities felt to be most consistent with subsegmental atelectasis. No consolidative opacity. Mild pulmonary interstitial congestion without overt pulmonary edema. No pleural effusion. No pneumothorax. Few scattered calcified granulomas noted. No acute osseous finding.  Osteopenia. IMPRESSION: 1. Cardiomegaly with mild diffuse pulmonary interstitial congestion, with superimposed mild bibasilar atelectasis. 2. No other active cardiopulmonary disease. Electronically Signed   By: Jeannine Boga M.D.   On: 10/22/2019 03:42   {  Assessment and Plan:  AMERIYAH MOSCHELLA is a 84 y.o. female with CAD  s/p PCI, ischemic CM, PAD, HFrEF (XX%), CKD II, severe COPD, HTN, DM2, prior VT/VF s/p ICD, OSA, HLD, active smoking, and pHTN presenting with syncope and ICD discharge.   Interrogation of patient's ICD in the ED reveals at least 8 treated episodes of VT/VF in the past few weeks as well as multiple monitored episodes of fast VT. Her episode of "seizure" today corresponds with a treated episode of VT. Notably patient was recently found to have worsening LVEF, down from 35-40% in 2019 to now 20 to 25% by echo. She also had a recent SPECT which showed no ischemia. She does appear to be very mildly volume overloaded on exam, however I doubt this is a significant driver for her arrhythmia. Her K is 3.2 which certainly could be playing a role. I also worry about ischemia that may have been missed by her nuclear study. A coronary angiogram may be appropriate at this time as long as this is consistent with the patient's goals of care.   #) VT/VF w ICD shocks - EP eval in AM - check TSH (patient has been on amiodarone recently) - follow up 2nd troponin (1st slightly elevated likely due to ICD shocks and mild volume overload) - given 79mEq K IV - given 57mEq K PO - will likely need more K given that she received IV lasix in the ED - recheck BMP at 10AM - 2g Mag IV - coronary angiography would be appropriate to work up increasing VT/VF burden - continue IV amiodarone for now - cont home bisoprolol - additional medical therapies for new HFrEF as per below  #) HFrEF - s/p IV lasix x 1 in ED - electrolyte monitoring as per above - cont home bisoprolol - would recommend starting ACE/ARB/ARNI for newly diagnosed HFrEF - will start spironolactone 12.5mg  daily for newly diagnosed HFrEF and to assist with potassium repletion - will need to closely monitor renal function - home diuretic is furosemide 160mg  twice daily, may benefit from switching to torsemide - holding off on additional diuretic at this time,  will need to start on maintenance diuretic likely later today - may consider SGLT2 inhibitor given new HFrEF and concomitant DM  #) PAD - cont aspirin, plavix  #) DM: not on any meds for this at home - monitor, can start ISS if necessary  #) COPD on 2L home oxygen - cont home inhalers - cont oxygen therapy  #) Code status: patient's son, who is her healthcare power of attorney per his report, stated initially that the patient  told him that she would not want to be resuscitated. On my discussion with the patient however she states that she would want to be full code. Will order full code on this admission. Recommended that patient and son discuss this further. Additionally, given the patient's multiple severe comorbidities, frailty, and older age it would be appropriate to see if she would want palliative care services to be involved.  - consider palliative care consultation  Severity of Illness: The appropriate patient status for this patient is INPATIENT. Inpatient status is judged to be reasonable and necessary in order to provide the required intensity of service to ensure the patient's safety. The patient's presenting symptoms, physical exam findings, and initial radiographic and laboratory data in the context of their chronic comorbidities is felt to place them at high risk for further clinical deterioration. Furthermore, it is not anticipated that the patient will be medically stable for discharge from the hospital within 2 midnights of admission. The following factors support the patient status of inpatient.   " The patient's presenting symptoms include syncope. " The worrisome physical exam findings include course breath sounds. " The initial radiographic and laboratory data are worrisome because of ICD shocks on ICD interrogation. " The chronic co-morbidities include COPD, CAD.   * I certify that at the point of admission it is my clinical judgment that the patient will require  inpatient hospital care spanning beyond 2 midnights from the point of admission due to high intensity of service, high risk for further deterioration and high frequency of surveillance required.*    For questions or updates, please contact [place] Please consult [email or web address] for contact info under        Signed, Marcie Mowers, MD  10/22/2019 6:10 AM

## 2019-10-22 NOTE — ED Notes (Signed)
Dr. Lovena Le from Cardiology assessing pt at the bedside.

## 2019-10-23 DIAGNOSIS — I472 Ventricular tachycardia: Secondary | ICD-10-CM

## 2019-10-23 LAB — BASIC METABOLIC PANEL
Anion gap: 8 (ref 5–15)
BUN: 20 mg/dL (ref 8–23)
CO2: 27 mmol/L (ref 22–32)
Calcium: 9.1 mg/dL (ref 8.9–10.3)
Chloride: 105 mmol/L (ref 98–111)
Creatinine, Ser: 1.23 mg/dL — ABNORMAL HIGH (ref 0.44–1.00)
GFR calc Af Amer: 48 mL/min — ABNORMAL LOW (ref >60)
GFR calc non Af Amer: 41 mL/min — ABNORMAL LOW (ref >60)
Glucose, Bld: 154 mg/dL — ABNORMAL HIGH (ref 70–99)
Potassium: 3.6 mmol/L (ref 3.5–5.1)
Sodium: 140 mmol/L (ref 135–145)

## 2019-10-23 MED ORDER — POTASSIUM CHLORIDE CRYS ER 20 MEQ PO TBCR
40.0000 meq | EXTENDED_RELEASE_TABLET | Freq: Once | ORAL | Status: AC
  Administered 2019-10-23: 40 meq via ORAL
  Filled 2019-10-23: qty 2

## 2019-10-23 MED ORDER — SODIUM CHLORIDE 0.9 % WEIGHT BASED INFUSION
3.0000 mL/kg/h | INTRAVENOUS | Status: DC
  Administered 2019-10-24: 3 mL/kg/h via INTRAVENOUS

## 2019-10-23 MED ORDER — SODIUM CHLORIDE 0.9% FLUSH
3.0000 mL | Freq: Two times a day (BID) | INTRAVENOUS | Status: DC
  Administered 2019-10-23 – 2019-10-25 (×4): 3 mL via INTRAVENOUS

## 2019-10-23 MED ORDER — SODIUM CHLORIDE 0.9 % IV SOLN: 250.0000 mL | INTRAVENOUS | Status: DC | PRN

## 2019-10-23 MED ORDER — MAGNESIUM SULFATE 2 GM/50ML IV SOLN
2.0000 g | Freq: Once | INTRAVENOUS | Status: AC
  Administered 2019-10-23: 2 g via INTRAVENOUS
  Filled 2019-10-23: qty 50

## 2019-10-23 MED ORDER — SODIUM CHLORIDE 0.9 % WEIGHT BASED INFUSION
1.0000 mL/kg/h | INTRAVENOUS | Status: DC
  Administered 2019-10-24: 1 mL/kg/h via INTRAVENOUS

## 2019-10-23 MED ORDER — SODIUM CHLORIDE 0.9% FLUSH: 3.0000 mL | INTRAVENOUS | Status: DC | PRN

## 2019-10-23 MED ORDER — ALPRAZOLAM 0.5 MG PO TABS
1.0000 mg | ORAL_TABLET | Freq: Once | ORAL | Status: AC
  Administered 2019-10-23: 1 mg via ORAL
  Filled 2019-10-23: qty 2

## 2019-10-23 NOTE — H&P (View-Only) (Signed)
Progress Note  Patient Name: Sydney Phillips Date of Encounter: 10/23/2019  Primary Cardiologist: [name], MD   Subjective   No chest pain or sob. C/o ICD firing.  Inpatient Medications    Scheduled Meds: . allopurinol  300 mg Oral Daily  . aspirin  81 mg Oral Daily  . azelastine  2 spray Each Nare BID  . bisoprolol  5 mg Oral Daily  . cholecalciferol  1,000 Units Oral Daily  . clopidogrel  75 mg Oral Daily  . DULoxetine  60 mg Oral BID  . heparin  5,000 Units Subcutaneous Q8H  . potassium chloride  40 mEq Oral Once  . sodium chloride flush  3 mL Intravenous Once  . sodium chloride flush  3 mL Intravenous Q12H  . spironolactone  12.5 mg Oral Daily  . cyanocobalamin  1,000 mcg Oral Daily   Continuous Infusions: . sodium chloride    . amiodarone 30 mg/hr (10/23/19 0245)  . magnesium sulfate bolus IVPB     PRN Meds: sodium chloride, albuterol, ALPRAZolam, sodium chloride flush   Vital Signs    Vitals:   10/22/19 2025 10/23/19 0019 10/23/19 0508 10/23/19 0744  BP: (!) 155/92 (!) 161/88 (!) 154/81 (!) 152/68  Pulse: 64 65 72 71  Resp: 20 20 (!) 22 18  Temp: 97.6 F (36.4 C) 98.9 F (37.2 C) 97.6 F (36.4 C) 98.3 F (36.8 C)  TempSrc:   Oral Oral  SpO2: 97% 96% 94% 93%  Weight:   87.8 kg   Height:        Intake/Output Summary (Last 24 hours) at 10/23/2019 1123 Last data filed at 10/23/2019 0838 Gross per 24 hour  Intake 1235 ml  Output 700 ml  Net 535 ml   Filed Weights   10/22/19 1033 10/22/19 1400 10/23/19 0508  Weight: 84.8 kg 87.7 kg 87.8 kg    Telemetry    NSR with ventricular pacing and VF - Personally Reviewed  ECG    none - Personally Reviewed  Physical Exam   GEN: No acute distress.   Neck: No JVD Cardiac: RRR, no murmurs, rubs, or gallops.  Respiratory: Clear to auscultation bilaterally. GI: Soft, nontender, non-distended  MS: No edema; No deformity. Neuro:  Nonfocal  Psych: Normal affect   Labs    Chemistry Recent  Labs  Lab 10/22/19 0325 10/22/19 1700 10/23/19 0444  NA 143 139 140  K 3.2* 4.1 3.6  CL 100 104 105  CO2 28 20* 27  GLUCOSE 170* 185* 154*  BUN 20 24* 20  CREATININE 1.45* 1.18* 1.23*  CALCIUM 9.3 9.2 9.1  PROT 6.7  --   --   ALBUMIN 3.8  --   --   AST 18  --   --   ALT 14  --   --   ALKPHOS 81  --   --   BILITOT 0.6  --   --   GFRNONAA 34* 43* 41*  GFRAA 39* 50* 48*  ANIONGAP 15 15 8      Hematology Recent Labs  Lab 10/22/19 0325  WBC 10.1  RBC 4.41  HGB 13.9  HCT 43.5  MCV 98.6  MCH 31.5  MCHC 32.0  RDW 17.1*  PLT 273    Cardiac EnzymesNo results for input(s): TROPONINI in the last 168 hours. No results for input(s): TROPIPOC in the last 168 hours.   BNP Recent Labs  Lab 10/22/19 0325  BNP 769.0*     DDimer No results for input(s):  DDIMER in the last 168 hours.   Radiology    DG Chest Portable 1 View  Result Date: 10/22/2019 CLINICAL DATA:  Initial evaluation for near syncope. EXAM: PORTABLE CHEST 1 VIEW COMPARISON:  Prior radiograph from 09/30/2019 FINDINGS: Left-sided pacemaker/AICD in place. Cardiomegaly, stable. Mediastinal silhouette within normal limits. Lungs normally inflated. Chronic coarsening of the pulmonary interstitial markings, similar to previous. Superimposed streaky bibasilar opacities felt to be most consistent with subsegmental atelectasis. No consolidative opacity. Mild pulmonary interstitial congestion without overt pulmonary edema. No pleural effusion. No pneumothorax. Few scattered calcified granulomas noted. No acute osseous finding.  Osteopenia. IMPRESSION: 1. Cardiomegaly with mild diffuse pulmonary interstitial congestion, with superimposed mild bibasilar atelectasis. 2. No other active cardiopulmonary disease. Electronically Signed   By: Jeannine Boga M.D.   On: 10/22/2019 03:42    Cardiac Studies   none  Patient Profile     84 y.o. female admitted with recurrent ICD shocks due to recurrent VF  Assessment & Plan      1. VF - he will continue his amiodarone.  2. CAD - I suspect that she is ischemic. She will undergo left heart cath.  3. ICD- her medtronic ICD is working normally.   Krishna Heuer,M.D.      For questions or updates, please contact [place] Please consult [email or web address] for contact info under Cardiology/STEMI.      Signed, Cristopher Peru, MD  10/23/2019, 11:23 AM  Patient ID: Sydney Phillips, female   DOB: 05/07/1936, 84 y.o.   MRN: [id number]

## 2019-10-23 NOTE — Progress Notes (Addendum)
Called by RN regarding VT and ICD shock.  Magnesium yesterday was 1.8 and potassium was 3.6 today.  Will give 2 g of magnesium and K. Dur 40 mEq.  Dr. Lovena Le aware.  Rosaria Ferries, PA-C 10/23/2019 10:26 AM  Mikle Bosworth.D.

## 2019-10-23 NOTE — Progress Notes (Signed)
Pt is in and out of vtach per ccmd  Pt asymptomatic  md notified  Will continue to monitor

## 2019-10-23 NOTE — Progress Notes (Signed)
Progress Note  Patient Name: Sydney Phillips Date of Encounter: 10/23/2019  Primary Cardiologist: [name], MD   Subjective   No chest pain or sob. C/o ICD firing.  Inpatient Medications    Scheduled Meds: . allopurinol  300 mg Oral Daily  . aspirin  81 mg Oral Daily  . azelastine  2 spray Each Nare BID  . bisoprolol  5 mg Oral Daily  . cholecalciferol  1,000 Units Oral Daily  . clopidogrel  75 mg Oral Daily  . DULoxetine  60 mg Oral BID  . heparin  5,000 Units Subcutaneous Q8H  . potassium chloride  40 mEq Oral Once  . sodium chloride flush  3 mL Intravenous Once  . sodium chloride flush  3 mL Intravenous Q12H  . spironolactone  12.5 mg Oral Daily  . cyanocobalamin  1,000 mcg Oral Daily   Continuous Infusions: . sodium chloride    . amiodarone 30 mg/hr (10/23/19 0245)  . magnesium sulfate bolus IVPB     PRN Meds: sodium chloride, albuterol, ALPRAZolam, sodium chloride flush   Vital Signs    Vitals:   10/22/19 2025 10/23/19 0019 10/23/19 0508 10/23/19 0744  BP: (!) 155/92 (!) 161/88 (!) 154/81 (!) 152/68  Pulse: 64 65 72 71  Resp: 20 20 (!) 22 18  Temp: 97.6 F (36.4 C) 98.9 F (37.2 C) 97.6 F (36.4 C) 98.3 F (36.8 C)  TempSrc:   Oral Oral  SpO2: 97% 96% 94% 93%  Weight:   87.8 kg   Height:        Intake/Output Summary (Last 24 hours) at 10/23/2019 1123 Last data filed at 10/23/2019 0838 Gross per 24 hour  Intake 1235 ml  Output 700 ml  Net 535 ml   Filed Weights   10/22/19 1033 10/22/19 1400 10/23/19 0508  Weight: 84.8 kg 87.7 kg 87.8 kg    Telemetry    NSR with ventricular pacing and VF - Personally Reviewed  ECG    none - Personally Reviewed  Physical Exam   GEN: No acute distress.   Neck: No JVD Cardiac: RRR, no murmurs, rubs, or gallops.  Respiratory: Clear to auscultation bilaterally. GI: Soft, nontender, non-distended  MS: No edema; No deformity. Neuro:  Nonfocal  Psych: Normal affect   Labs    Chemistry Recent  Labs  Lab 10/22/19 0325 10/22/19 1700 10/23/19 0444  NA 143 139 140  K 3.2* 4.1 3.6  CL 100 104 105  CO2 28 20* 27  GLUCOSE 170* 185* 154*  BUN 20 24* 20  CREATININE 1.45* 1.18* 1.23*  CALCIUM 9.3 9.2 9.1  PROT 6.7  --   --   ALBUMIN 3.8  --   --   AST 18  --   --   ALT 14  --   --   ALKPHOS 81  --   --   BILITOT 0.6  --   --   GFRNONAA 34* 43* 41*  GFRAA 39* 50* 48*  ANIONGAP 15 15 8      Hematology Recent Labs  Lab 10/22/19 0325  WBC 10.1  RBC 4.41  HGB 13.9  HCT 43.5  MCV 98.6  MCH 31.5  MCHC 32.0  RDW 17.1*  PLT 273    Cardiac EnzymesNo results for input(s): TROPONINI in the last 168 hours. No results for input(s): TROPIPOC in the last 168 hours.   BNP Recent Labs  Lab 10/22/19 0325  BNP 769.0*     DDimer No results for input(s):  DDIMER in the last 168 hours.   Radiology    DG Chest Portable 1 View  Result Date: 10/22/2019 CLINICAL DATA:  Initial evaluation for near syncope. EXAM: PORTABLE CHEST 1 VIEW COMPARISON:  Prior radiograph from 09/30/2019 FINDINGS: Left-sided pacemaker/AICD in place. Cardiomegaly, stable. Mediastinal silhouette within normal limits. Lungs normally inflated. Chronic coarsening of the pulmonary interstitial markings, similar to previous. Superimposed streaky bibasilar opacities felt to be most consistent with subsegmental atelectasis. No consolidative opacity. Mild pulmonary interstitial congestion without overt pulmonary edema. No pleural effusion. No pneumothorax. Few scattered calcified granulomas noted. No acute osseous finding.  Osteopenia. IMPRESSION: 1. Cardiomegaly with mild diffuse pulmonary interstitial congestion, with superimposed mild bibasilar atelectasis. 2. No other active cardiopulmonary disease. Electronically Signed   By: Jeannine Boga M.D.   On: 10/22/2019 03:42    Cardiac Studies   none  Patient Profile     84 y.o. female admitted with recurrent ICD shocks due to recurrent VF  Assessment & Plan      1. VF - he will continue his amiodarone.  2. CAD - I suspect that she is ischemic. She will undergo left heart cath.  3. ICD- her medtronic ICD is working normally.   Kieli Golladay,M.D.      For questions or updates, please contact [place] Please consult [email or web address] for contact info under Cardiology/STEMI.      Signed, Cristopher Peru, MD  10/23/2019, 11:23 AM  Patient ID: [name], female   DOB: [birth year], 84 y.o.   MRN: [id number]

## 2019-10-23 NOTE — Progress Notes (Signed)
MD notified twice regarding evening xanax dose Will continue to monitor

## 2019-10-23 NOTE — Progress Notes (Signed)
   10/23/19 1015  Assess: MEWS Score  Temp (!) 97.4 F (36.3 C)  BP (!) 124/101  Pulse Rate (!) 101  ECG Heart Rate 76  Resp 20  Level of Consciousness Alert  SpO2 99 %  O2 Device Nasal Cannula  O2 Flow Rate (L/min) 3 L/min  Assess: MEWS Score  MEWS Temp 0  MEWS Systolic 0  MEWS Pulse 0  MEWS RR 0  MEWS LOC 0  MEWS Score 0  MEWS Score Color Green  Assess: if the MEWS score is Yellow or Red  Were vital signs taken at a resting state? Yes  Focused Assessment Documented focused assessment  Early Detection of Sepsis Score *See Row Information* Low  MEWS guidelines implemented *See Row Information* Yes  Treat  MEWS Interventions Escalated (See documentation below)  Take Vital Signs  Increase Vital Sign Frequency  Yellow: Q 2hr X 2 then Q 4hr X 2, if remains yellow, continue Q 4hrs  Escalate  MEWS: Escalate Yellow: discuss with charge nurse/RN and consider discussing with provider and RRT  Notify: Charge Nurse/RN  Name of Charge Nurse/RN Notified  Jasper Riling)  Date Charge Nurse/RN Notified 10/23/19  Time Charge Nurse/RN Notified 1015  Notify: Provider  Provider Name/Title  Lovena Le, MD and Barratt PA)  Date Provider Notified 10/23/19  Time Provider Notified 1015  Notification Type Page  Notification Reason Other (Comment)  Response See new orders (HR)  Date of Provider Response 10/23/19  Time of Provider Response 1100  Document  Patient Outcome Stabilized after interventions

## 2019-10-23 NOTE — Plan of Care (Signed)
  Problem: Clinical Measurements: Goal: Ability to maintain clinical measurements within normal limits will improve Outcome: Progressing   Problem: Activity: Goal: Risk for activity intolerance will decrease Outcome: Progressing   

## 2019-10-24 ENCOUNTER — Encounter: Payer: Medicare Other | Admitting: Cardiovascular Disease

## 2019-10-24 ENCOUNTER — Encounter (HOSPITAL_COMMUNITY)
Admission: EM | Disposition: A | Discharge status: Home Health | Payer: Self-pay | Source: Home / Self Care | Attending: Internal Medicine

## 2019-10-24 DIAGNOSIS — I739 Peripheral vascular disease, unspecified: Secondary | ICD-10-CM

## 2019-10-24 DIAGNOSIS — I2510 Atherosclerotic heart disease of native coronary artery without angina pectoris: Secondary | ICD-10-CM

## 2019-10-24 HISTORY — PX: LEFT HEART CATH AND CORONARY ANGIOGRAPHY: CATH118249

## 2019-10-24 LAB — BASIC METABOLIC PANEL
Anion gap: 11 (ref 5–15)
BUN: 26 mg/dL — ABNORMAL HIGH (ref 8–23)
CO2: 25 mmol/L (ref 22–32)
Calcium: 9 mg/dL (ref 8.9–10.3)
Chloride: 104 mmol/L (ref 98–111)
Creatinine, Ser: 1.45 mg/dL — ABNORMAL HIGH (ref 0.44–1.00)
GFR calc Af Amer: 39 mL/min — ABNORMAL LOW (ref >60)
GFR calc non Af Amer: 34 mL/min — ABNORMAL LOW (ref >60)
Glucose, Bld: 134 mg/dL — ABNORMAL HIGH (ref 70–99)
Potassium: 4.1 mmol/L (ref 3.5–5.1)
Sodium: 140 mmol/L (ref 135–145)

## 2019-10-24 SURGERY — LEFT HEART CATH AND CORONARY ANGIOGRAPHY
Anesthesia: LOCAL

## 2019-10-24 MED ORDER — HYDRALAZINE HCL 20 MG/ML IJ SOLN: 10.0000 mg | INTRAMUSCULAR | Status: AC | PRN

## 2019-10-24 MED ORDER — HEPARIN (PORCINE) IN NACL 1000-0.9 UT/500ML-% IV SOLN
INTRAVENOUS | Status: DC | PRN
  Administered 2019-10-24 (×2): 500 mL

## 2019-10-24 MED ORDER — CLOPIDOGREL BISULFATE 75 MG PO TABS: 75.0000 mg | ORAL_TABLET | Freq: Every day | ORAL | Status: DC

## 2019-10-24 MED ORDER — ONDANSETRON HCL 4 MG/2ML IJ SOLN: 4.0000 mg | Freq: Four times a day (QID) | INTRAMUSCULAR | Status: DC | PRN

## 2019-10-24 MED ORDER — LIDOCAINE HCL (PF) 1 % IJ SOLN
INTRAMUSCULAR | Status: AC
  Filled 2019-10-24: qty 30

## 2019-10-24 MED ORDER — LIDOCAINE HCL (PF) 1 % IJ SOLN
INTRAMUSCULAR | Status: DC | PRN
  Administered 2019-10-24: 2 mL

## 2019-10-24 MED ORDER — ACETAMINOPHEN 325 MG PO TABS: 650.0000 mg | ORAL_TABLET | ORAL | Status: DC | PRN

## 2019-10-24 MED ORDER — SODIUM CHLORIDE 0.9% FLUSH: 3.0000 mL | INTRAVENOUS | Status: DC | PRN

## 2019-10-24 MED ORDER — OXYCODONE HCL 5 MG PO TABS: 5.0000 mg | ORAL_TABLET | ORAL | Status: DC | PRN

## 2019-10-24 MED ORDER — HEPARIN (PORCINE) IN NACL 1000-0.9 UT/500ML-% IV SOLN
INTRAVENOUS | Status: AC
  Filled 2019-10-24: qty 1000

## 2019-10-24 MED ORDER — SODIUM CHLORIDE 0.9 % IV SOLN: INTRAVENOUS | Status: AC

## 2019-10-24 MED ORDER — VERAPAMIL HCL 2.5 MG/ML IV SOLN
INTRAVENOUS | Status: AC
  Filled 2019-10-24: qty 2

## 2019-10-24 MED ORDER — HEPARIN SODIUM (PORCINE) 1000 UNIT/ML IJ SOLN
INTRAMUSCULAR | Status: AC
  Filled 2019-10-24: qty 1

## 2019-10-24 MED ORDER — VERAPAMIL HCL 2.5 MG/ML IV SOLN
INTRAVENOUS | Status: DC | PRN
  Administered 2019-10-24: 10 mL via INTRA_ARTERIAL

## 2019-10-24 MED ORDER — HEPARIN SODIUM (PORCINE) 1000 UNIT/ML IJ SOLN
INTRAMUSCULAR | Status: DC | PRN
  Administered 2019-10-24: 4500 [IU] via INTRAVENOUS

## 2019-10-24 MED ORDER — SODIUM CHLORIDE 0.9% FLUSH
3.0000 mL | Freq: Two times a day (BID) | INTRAVENOUS | Status: DC
  Administered 2019-10-24 – 2019-10-26 (×3): 3 mL via INTRAVENOUS

## 2019-10-24 MED ORDER — HEPARIN SODIUM (PORCINE) 5000 UNIT/ML IJ SOLN
5000.0000 [IU] | Freq: Three times a day (TID) | INTRAMUSCULAR | Status: DC
  Administered 2019-10-24 – 2019-10-26 (×5): 5000 [IU] via SUBCUTANEOUS
  Filled 2019-10-24 (×5): qty 1

## 2019-10-24 MED ORDER — SODIUM CHLORIDE 0.9 % IV SOLN: 250.0000 mL | INTRAVENOUS | Status: DC | PRN

## 2019-10-24 MED ORDER — LABETALOL HCL 5 MG/ML IV SOLN: 10.0000 mg | INTRAVENOUS | Status: AC | PRN

## 2019-10-24 MED ORDER — IOHEXOL 350 MG/ML SOLN
INTRAVENOUS | Status: DC | PRN
  Administered 2019-10-24: 50 mL

## 2019-10-24 MED ORDER — VERAPAMIL HCL 2.5 MG/ML IV SOLN
INTRAVENOUS | Status: DC | PRN
  Administered 2019-10-24: 12:00:00 10 mL via INTRA_ARTERIAL

## 2019-10-24 SURGICAL SUPPLY — 11 items
CATH 5FR JL3.5 JR4 ANG PIG MP (CATHETERS) ×1 IMPLANT
CATH INFINITI MULTIPACK ANG 4F (CATHETERS) ×1 IMPLANT
DEVICE RAD COMP TR BAND LRG (VASCULAR PRODUCTS) ×1 IMPLANT
GLIDESHEATH SLEND A-KIT 6F 22G (SHEATH) ×1 IMPLANT
GUIDEWIRE INQWIRE 1.5J.035X260 (WIRE) IMPLANT
INQWIRE 1.5J .035X260CM (WIRE) ×2
KIT HEART LEFT (KITS) ×2 IMPLANT
PACK CARDIAC CATHETERIZATION (CUSTOM PROCEDURE TRAY) ×2 IMPLANT
TRANSDUCER W/STOPCOCK (MISCELLANEOUS) ×2 IMPLANT
TUBING CIL FLEX 10 FLL-RA (TUBING) ×2 IMPLANT
WIRE HI TORQ VERSACORE-J 145CM (WIRE) ×1 IMPLANT

## 2019-10-24 NOTE — Telephone Encounter (Signed)
No show today

## 2019-10-24 NOTE — CV Procedure (Signed)
   Coronary angiography and left heart cath via right radial and likely the radial recurrent.  Real-time vascular ultrasound used for access.  Difficulty advancing 5 French catheters.  Unable to reach the aortic valve with a 5 [place] cath due to resistance in the arm.  Down-sized to 4 [place] diagnostic catheters.  With difficulty, was able to visualize the left coronary system and right coronary.  LVEDP is elevated at 31 mmHg.  Right coronary is widely patent  Left main 30% ostial.  Ostial LAD, concentric 30 to 50% narrowing unchanged from prior.  Ostial 30% circumflex.  With 40 to 50% narrowing in the mid stent  RCA is with eccentric 50% narrowing proximal to the stented segment, 30 to 50% in-stent restenosis, and widely patent beyond the stent.  No high-grade/critical obstructive disease is noted in the left main, circumflex, and RCA.  Ostial LAD 50 to 60% unchanged from 2017.  RECOMMENDATIONS: Dyspnea with minimal activity, elevated LVEDP, suggest optimization of heart failure therapy may be helpful.

## 2019-10-24 NOTE — Plan of Care (Signed)
  Problem: Clinical Measurements: Goal: Respiratory complications will improve Outcome: Progressing   

## 2019-10-24 NOTE — Interval H&P Note (Signed)
History and Physical Interval Note:  10/24/2019 12:11 PM  Patient is short of breath with transitioning from the stretcher to the cath table.  O2 sats transiently dropped into the low 80s but have increased back to 97% lying flat with oxygen.  Unfortunately the patient's creatinine is on upward trajectory increasing to 1.45 from 1.0 weight on admission over the last 48 hours.  She denies angina.  She has had multiple device discharges.  Prior angiography has been evaluated.  In 2017 she had RCA and circumflex stents.  An ostial 50 to 70% LAD lesion was not treated.  EF is currently 20 to 25%.  Impression: High risk coronary angiography and potential PCI given data noted above.  We will try to use as little contrast as possible, collect daily, and come up with a consolidated treatment plan after we know status of coronary arteries.  Management may need to be staged.  Sydney Phillips  has presented today for surgery, with the diagnosis of NSTEMi.  The various methods of treatment have been discussed with the patient and family. After consideration of risks, benefits and other options for treatment, the patient has consented to  Procedure(s): LEFT HEART CATH AND CORONARY ANGIOGRAPHY (N/A) as a surgical intervention.  The patient's history has been reviewed, patient examined, no change in status, stable for surgery.  I have reviewed the patient's chart and labs.  Questions were answered to the patient's satisfaction.     Sydney Phillips

## 2019-10-24 NOTE — Interval H&P Note (Signed)
Cath Lab Visit (complete for each Cath Lab visit)  Clinical Evaluation Leading to the Procedure:   ACS: No.  Non-ACS:    Anginal Classification: CCS III  Anti-ischemic medical therapy: Maximal Therapy (2 or more classes of medications)  Non-Invasive Test Results: High-risk stress test findings: cardiac mortality >3%/year  Prior CABG: No previous CABG      History and Physical Interval Note:  10/24/2019 11:57 AM  Sydney Phillips  has presented today for surgery, with the diagnosis of NSTEMi.  The various methods of treatment have been discussed with the patient and family. After consideration of risks, benefits and other options for treatment, the patient has consented to  Procedure(s): LEFT HEART CATH AND CORONARY ANGIOGRAPHY (N/A) as a surgical intervention.  The patient's history has been reviewed, patient examined, no change in status, stable for surgery.  I have reviewed the patient's chart and labs.  Questions were answered to the patient's satisfaction.     Belva Crome III

## 2019-10-24 NOTE — Telephone Encounter (Signed)
Called patient regarding no show to apt today. No answer, LMOVM.

## 2019-10-24 NOTE — TOC Initial Note (Signed)
Transition of Care [facility]) - Initial/Assessment Note    Patient Details  Name: Sydney Phillips MRN: [id number] Date of Birth: [birth year]  [facility] CM/SW Contact:    Alberteen Sam, LCSW Phone Number: 10/24/2019, 4:49 PM  Clinical Narrative:                  CSW met with patient at bedside to complete heart failure home health screen. Patient is agreeable to home health services, has had them in the past through [facility] would not like them again. Agreeable for CSW to fax out referrals for which agency can accept. She reports having oxygen through [employer], states they recently came out to give her more oxygen and she has all her needs met at this time. No DME needs as reports she has walkers, rollators and canes at home.   CSW to send out referrals for home health PT, RN and aide, pending acceptance at this time.   Expected Discharge Plan: [place] Barriers to Discharge: Continued Medical Work up   Patient Goals and CMS Choice Patient states their goals for this hospitalization and ongoing recovery are:: to go home CMS Medicare.gov Compare Post Acute Care list provided to:: Patient Choice offered to / list presented to : Patient  Expected Discharge Plan and Services Expected Discharge Plan: [place]       Living arrangements for the past 2 months: Single Family Home                           HH Arranged: RN, PT, Nurse's Aide          Prior Living Arrangements/Services Living arrangements for the past 2 months: Single Family Home Lives with:: Self Patient language and need for interpreter reviewed:: Yes Do you feel safe going back to the place where you live?: Yes      Need for Family Participation in Patient Care: Yes (Comment) Care giver support system in place?: Yes (comment)   Criminal Activity/Legal Involvement Pertinent to Current Situation/Hospitalization: No - Comment as needed  Activities of Daily  Living Home Assistive Devices/Equipment: Walker (specify type) ADL Screening (condition at time of admission) Patient's cognitive ability adequate to safely complete daily activities?: Yes Is the patient deaf or have difficulty hearing?: Yes Does the patient have difficulty seeing, even when wearing glasses/contacts?: No Does the patient have difficulty concentrating, remembering, or making decisions?: No Patient able to express need for assistance with ADLs?: Yes Does the patient have difficulty dressing or bathing?: No Independently performs ADLs?: Yes (appropriate for developmental age) Does the patient have difficulty walking or climbing stairs?: Yes Weakness of Legs: Both Weakness of Arms/Hands: None  Permission Sought/Granted Permission sought to share information with : Case Manager, Customer service manager, Family Supports Permission granted to share information with : Yes, Verbal Permission Granted  Share Information with NAME: [name]  Permission granted to share info w AGENCY: home health  Permission granted to share info w Relationship: son  Permission granted to share info w Contact Information: [phone]  Emotional Assessment Appearance:: Appears stated age Attitude/Demeanor/Rapport: Gracious Affect (typically observed): Calm Orientation: : Oriented to Self, Oriented to Place, Oriented to  Time, Oriented to Situation Alcohol / Substance Use: Not Applicable Psych Involvement: No (comment)  Admission diagnosis:  Ventricular fibrillation ([place]) [I49.01] VT (ventricular tachycardia) ([place]) [I47.2] Patient Active Problem List   Diagnosis Date Noted  . VT (ventricular tachycardia) ([place])  10/22/2019  . Type 2 diabetes mellitus with diabetic chronic kidney disease ([place]) 01/05/2019  . Diabetic foot ulcers ([place]) 06/18/2018  . Rhinitis, chronic 09/29/2017  . Hearing loss 09/04/2017  . Perforation of left tympanic membrane 09/04/2017  . Acute exacerbation of CHF  (congestive heart failure) ([place]) 07/19/2017  . Chronic combined systolic and diastolic heart failure ([place]) 07/19/2017  . Acute on chronic respiratory failure with hypoxia ([place]) 07/19/2017  . Poor dentition 04/01/2017  . Tobacco abuse 12/17/2016  . Steroid-induced psychosis, with hallucinations ([place])   . Chronic respiratory failure with hypoxia ([place])   . Acute encephalopathy 05/30/2016  . CKD (chronic kidney disease), stage III ([place]) 05/30/2016  . Leukocytosis   . Stenosis of left subclavian artery ([place]) 05/19/2016  . Somnolence   . OSA and COPD overlap syndrome ([place])   . Cardiomyopathy, ischemic   . Ischemic mitral valve regurgitation   . Pulmonary hypertension ([place])   . CAD S/P percutaneous coronary angioplasty   . Altered mental state 02/26/2016  . Arthralgia 10/12/2015  . Left carotid stenosis 11/08/2014  . Iron deficiency anemia 04/17/2014  . Carotid artery disease ([place]) 04/17/2014  . LBBB (left bundle branch block) 04/16/2014  . Diastolic dysfunction 09/04/2246  . NSTEMI (non-ST elevated myocardial infarction) ([place]) 01/20/2014  . Melena 01/20/2014  . Microcytic anemia 01/19/2014  . Colon polyp 03/24/2013  . S/P angioplasty with stent, lt. subclavian 07/31/11 08/01/2011  . Implantable cardioverter-defibrillator (ICD) in situ 07/28/2011  . PVD, 07/28/2011  . Vertigo 07/27/2011  . Hypokalemia 07/27/2011  . SPINAL STENOSIS 01/28/2010  . Chronic constipation 01/14/2010  . Insomnia 01/23/2009  . HIP PAIN, BILATERAL 06/21/2008  . Coronary atherosclerosis 03/30/2008  . Other primary cardiomyopathies 03/30/2008  . Myalgia and myositis 11/26/2007  . Other and unspecified hyperlipidemia 07/28/2007  . WEIGHT GAIN 06/04/2007  . COPD (chronic obstructive pulmonary disease) ([place]) 03/02/2007  . Depression with anxiety 02/25/2007  . Hypertension 02/25/2007  . GERD 02/25/2007  . COLONIC POLYPS, HX OF 02/25/2007   PCP:  Laurey Morale, MD Pharmacy:   [facility], [place] [address] Phone: [phone] Fax: [phone]     Social Determinants of Health (SDOH) Interventions    Readmission Risk Interventions No flowsheet data found.

## 2019-10-25 LAB — CBC
HCT: 42.3 % (ref 36.0–46.0)
Hemoglobin: 13.6 g/dL (ref 12.0–15.0)
MCH: 31.6 pg (ref 26.0–34.0)
MCHC: 32.2 g/dL (ref 30.0–36.0)
MCV: 98.4 fL (ref 80.0–100.0)
Platelets: 265 10*3/uL (ref 150–400)
RBC: 4.3 MIL/uL (ref 3.87–5.11)
RDW: 17.3 % — ABNORMAL HIGH (ref 11.5–15.5)
WBC: 11.3 10*3/uL — ABNORMAL HIGH (ref 4.0–10.5)
nRBC: 0 % (ref 0.0–0.2)

## 2019-10-25 LAB — BASIC METABOLIC PANEL
Anion gap: 11 (ref 5–15)
BUN: 28 mg/dL — ABNORMAL HIGH (ref 8–23)
CO2: 26 mmol/L (ref 22–32)
Calcium: 9 mg/dL (ref 8.9–10.3)
Chloride: 103 mmol/L (ref 98–111)
Creatinine, Ser: 1.29 mg/dL — ABNORMAL HIGH (ref 0.44–1.00)
GFR calc Af Amer: 45 mL/min — ABNORMAL LOW (ref >60)
GFR calc non Af Amer: 39 mL/min — ABNORMAL LOW (ref >60)
Glucose, Bld: 154 mg/dL — ABNORMAL HIGH (ref 70–99)
Potassium: 4.1 mmol/L (ref 3.5–5.1)
Sodium: 140 mmol/L (ref 135–145)

## 2019-10-25 MED ORDER — FUROSEMIDE 10 MG/ML IJ SOLN
80.0000 mg | Freq: Once | INTRAMUSCULAR | Status: AC
  Administered 2019-10-25: 80 mg via INTRAVENOUS
  Filled 2019-10-25: qty 8

## 2019-10-25 MED ORDER — FUROSEMIDE 10 MG/ML IJ SOLN
80.0000 mg | Freq: Two times a day (BID) | INTRAMUSCULAR | Status: DC
  Administered 2019-10-25: 80 mg via INTRAVENOUS
  Filled 2019-10-25: qty 8

## 2019-10-25 NOTE — Progress Notes (Addendum)
Electrophysiology Rounding Note  Patient Name: Sydney Phillips Date of Encounter: 10/25/2019  Primary Cardiologist: [name], MD Electrophysiologist: [name], MD   Subjective   No chest pain, no further ICD firing  She is more SOB this am and wants the results of any testing relayed to her son. IV lasix ordered.    Inpatient Medications    Scheduled Meds:  allopurinol  300 mg Oral Daily   aspirin  81 mg Oral Daily   azelastine  2 spray Each Nare BID   bisoprolol  5 mg Oral Daily   cholecalciferol  1,000 Units Oral Daily   clopidogrel  75 mg Oral Daily   DULoxetine  60 mg Oral BID   furosemide  80 mg Intravenous Once   heparin  5,000 Units Subcutaneous Q8H   sodium chloride flush  3 mL Intravenous Once   sodium chloride flush  3 mL Intravenous Q12H   sodium chloride flush  3 mL Intravenous Q12H   sodium chloride flush  3 mL Intravenous Q12H   spironolactone  12.5 mg Oral Daily   cyanocobalamin  1,000 mcg Oral Daily   Continuous Infusions:  sodium chloride     sodium chloride     amiodarone 30 mg/hr (10/25/19 0141)   PRN Meds: sodium chloride, sodium chloride, acetaminophen, albuterol, ALPRAZolam, ondansetron (ZOFRAN) IV, oxyCODONE, sodium chloride flush, sodium chloride flush   Vital Signs    Vitals:   10/25/19 0056 10/25/19 0542 10/25/19 0546 10/25/19 0729  BP: (!) 149/78 (!) 165/79  (!) 174/80  Pulse: 67 72  76  Resp: (!) 21 (!) 23  15  Temp: 98.1 F (36.7 C) 98.1 F (36.7 C)  (!) 97.3 F (36.3 C)  TempSrc: Oral Oral  Oral  SpO2: 94% 92%  93%  Weight:   91.2 kg   Height:        Intake/Output Summary (Last 24 hours) at 10/25/2019 0818 Last data filed at 10/25/2019 0635 Gross per 24 hour  Intake 793.34 ml  Output 1000 ml  Net -206.66 ml   Filed Weights   10/23/19 0508 10/24/19 0030 10/25/19 0546  Weight: 87.8 kg 90.1 kg 91.2 kg    Physical Exam    GEN- The patient is well appearing, alert and oriented x 3 today.   Head-  normocephalic, atraumatic Eyes-  Sclera clear, conjunctiva pink Ears- hearing intact Oropharynx- clear Neck- supple Lungs- Clear to ausculation bilaterally, normal work of breathing Heart- Regular rate and rhythm, no murmurs, rubs or gallops GI- soft, NT, ND, + BS Extremities- no clubbing, cyanosis, or edema Skin- no rash or lesion Psych- euthymic mood, full affect Neuro- strength and sensation are intact  Labs    CBC Recent Labs    10/25/19 0602  WBC 11.3*  HGB 13.6  HCT 42.3  MCV 98.4  PLT 99991111   Basic Metabolic Panel Recent Labs    10/24/19 0639 10/25/19 0602  NA 140 140  K 4.1 4.1  CL 104 103  CO2 25 26  GLUCOSE 134* 154*  BUN 26* 28*  CREATININE 1.45* 1.29*  CALCIUM 9.0 9.0   Liver Function Tests No results for input(s): AST, ALT, ALKPHOS, BILITOT, PROT, ALBUMIN in the last 72 hours. No results for input(s): LIPASE, AMYLASE in the last 72 hours. Cardiac Enzymes No results for input(s): CKTOTAL, CKMB, CKMBINDEX, TROPONINI in the last 72 hours.   Telemetry    NSR, no further VT since sunday (personally reviewed)  Radiology    CARDIAC  CATHETERIZATION  Result Date: 10/24/2019  Moderate three-vessel coronary disease.  30% ostial left main.  60% ostial LAD (unchanged from 2017) with moderate mid LAD plaque and calcification.  Patent proximal to mid circumflex with diffuse 50% in-stent restenosis.  Ostial circumflex contains 30 to 40% narrowing.  RCA contains up to 50% diffuse in-stent restenosis.  Proximal to the stented segment is an eccentric 70% region of narrowing  Elevated LVEDP, 31 mmHg suggesting decompensated acute on chronic combined systolic heart failure.  Catheterization from right radial was compromised by difficulty with catheter advancement, most likely due to selection of the radial recurrent route to the subclavian artery requiring that catheter size be downgraded to 4 [place]. Recommendations:  No high-grade thrombotic appearing lesions  noted.  Three-vessel disease as noted above.  Coronary angiography/PCI attempt in the future should not be via the right radial approach.  Guideline directed therapy for acute on chronic combined systolic and diastolic heart failure.  Consider advanced heart failure team consult.   Patient Profile     Sydney Phillips is a 84 y.o. female with a past medical history significant for chronic systolic CHF, CAD, and recurrent VF was admitted for ICD shocks.   Assessment & Plan    1. VF No further Continue amiodarone Cath yesterday as below. No culprit/target lesion at this time  2. CAD Moderate 3vD with LVEDP 31 mmHg Medical therapy and aggressive HF management recommended  3. Acute on chronic systolic CHF, mixed ischemic/non-ischemic Volume status elevated on exam Resume IV lasix 80 mg BID for today at least.  She states she was on lasix 80 mg BID at home, but is ordered for 160 mg BID. Will clarify. If taking 160 would recommend change to torsemide. Continue bisoprolol 5 mg daily for now Medical therapy has been limited by renal function periods of orthostatic hypotension which has previously led to syncope and falls (Thus poor candidate for Entresto especially) Continue spironolactone 12.5 mg daily. Will consider increase  4. CKD III Baseline cr appears 1.2 - 1.4  Pt will need at least several doses of IV lasix. Continue to diurese and adjust HF medications as tolerated. If diuresis difficult, consider HF consult; at the least will need HF outpatient follow up.   For questions or updates, please contact [place] Please consult [email or web address] for contact info under Cardiology/STEMI.  Signed, Shirley Friar, PA-C  10/25/2019, 8:18 AM    EP Attending  Patient seen and examined. Agree with the findings as noted above. She has volume overload. We will increase her lasix.  Mikle Bosworth.D.

## 2019-10-25 NOTE — TOC Progression Note (Addendum)
Transition of Care [facility]) - Progression Note    Patient Details  Name: Sydney Phillips MRN: [id number] Date of Birth: Nov 27, 1935  Transition of Care [facility]) CM/SW [place], [place] Phone Number: 10/25/2019, 4:14 PM  Clinical Narrative:     CSW spoke with [facility] and [facility] all cannot accept patient for home health services. CSW Spoke with Malachy Mood with [facility] who reports that CSW Should call back tomorrow with referral and she will see what she can do.    Expected Discharge Plan: [place] Barriers to Discharge: Continued Medical Work up  Expected Discharge Plan and Services Expected Discharge Plan: [place] arrangements for the past 2 months: Single Family Home                           HH Arranged: RN, PT, Nurse's Aide           Social Determinants of Health (SDOH) Interventions    Readmission Risk Interventions No flowsheet data found.

## 2019-10-25 NOTE — Plan of Care (Signed)
  Problem: Education: Goal: Knowledge of General Education information will improve Description: Including pain rating scale, medication(s)/side effects and non-pharmacologic comfort measures Outcome: Progressing   Problem: Health Behavior/Discharge Planning: Goal: Ability to manage health-related needs will improve Outcome: Progressing   Problem: Clinical Measurements: Goal: Ability to maintain clinical measurements within normal limits will improve Outcome: Progressing Goal: Will remain free from infection Outcome: Progressing Goal: Diagnostic test results will improve Outcome: Progressing Goal: Respiratory complications will improve Outcome: Progressing Goal: Cardiovascular complication will be avoided Outcome: Progressing   Problem: Activity: Goal: Risk for activity intolerance will decrease Outcome: Progressing   Problem: Nutrition: Goal: Adequate nutrition will be maintained Outcome: Progressing   Problem: Education: Goal: Ability to demonstrate management of disease process will improve Outcome: Progressing Goal: Ability to verbalize understanding of medication therapies will improve Outcome: Progressing Goal: Individualized Educational Video(s) Outcome: Progressing   Problem: Activity: Goal: Capacity to carry out activities will improve Outcome: Progressing   Problem: Cardiac: Goal: Ability to achieve and maintain adequate cardiopulmonary perfusion will improve Outcome: Progressing

## 2019-10-26 LAB — BASIC METABOLIC PANEL
Anion gap: 11 (ref 5–15)
BUN: 30 mg/dL — ABNORMAL HIGH (ref 8–23)
CO2: 25 mmol/L (ref 22–32)
Calcium: 9 mg/dL (ref 8.9–10.3)
Chloride: 100 mmol/L (ref 98–111)
Creatinine, Ser: 1.31 mg/dL — ABNORMAL HIGH (ref 0.44–1.00)
GFR calc Af Amer: 44 mL/min — ABNORMAL LOW (ref >60)
GFR calc non Af Amer: 38 mL/min — ABNORMAL LOW (ref >60)
Glucose, Bld: 276 mg/dL — ABNORMAL HIGH (ref 70–99)
Potassium: 3.9 mmol/L (ref 3.5–5.1)
Sodium: 136 mmol/L (ref 135–145)

## 2019-10-26 MED ORDER — AMIODARONE HCL 200 MG PO TABS: 200.0000 mg | ORAL_TABLET | Freq: Two times a day (BID) | ORAL | 6 refills | Status: DC

## 2019-10-26 MED ORDER — POTASSIUM CHLORIDE CRYS ER 20 MEQ PO TBCR
40.0000 meq | EXTENDED_RELEASE_TABLET | Freq: Two times a day (BID) | ORAL | Status: DC
  Administered 2019-10-26: 40 meq via ORAL
  Filled 2019-10-26: qty 2

## 2019-10-26 MED ORDER — SPIRONOLACTONE 25 MG PO TABS: 12.5000 mg | ORAL_TABLET | Freq: Every day | ORAL | 6 refills | Status: DC

## 2019-10-26 MED ORDER — TORSEMIDE 20 MG PO TABS: 80.0000 mg | ORAL_TABLET | Freq: Two times a day (BID) | ORAL | 6 refills | Status: DC

## 2019-10-26 MED ORDER — TORSEMIDE 20 MG PO TABS
80.0000 mg | ORAL_TABLET | Freq: Two times a day (BID) | ORAL | Status: DC
  Administered 2019-10-26: 80 mg via ORAL
  Filled 2019-10-26: qty 4

## 2019-10-26 MED ORDER — AMIODARONE HCL 200 MG PO TABS: 200.0000 mg | ORAL_TABLET | Freq: Two times a day (BID) | ORAL | Status: DC

## 2019-10-26 NOTE — Plan of Care (Signed)
  Problem: Activity: Goal: Risk for activity intolerance will decrease Outcome: Not Progressing   

## 2019-10-26 NOTE — TOC Transition Note (Signed)
Transition of Care [facility]) - CM/SW Discharge Note   Patient Details  Name: Sydney Phillips MRN: [id number] Date of Birth: [birth year]  Transition of Care [facility]) CM/SW Contact:  Alberteen Sam, LCSW Phone Number: 10/26/2019, 12:08 PM   Clinical Narrative:     Patient is set up with [facility] home health for RN and PT, wheel chair to be delivered to room by [facility]. Patient reports having plenty of home oxygen through [employer]. No further discharge needs identified at this time.    Final next level of care: [place] Barriers to Discharge: No Barriers Identified   Patient Goals and CMS Choice Patient states their goals for this hospitalization and ongoing recovery are:: to go home CMS Medicare.gov Compare Post Acute Care list provided to:: Patient Choice offered to / list presented to : Patient  Discharge Placement                       Discharge Plan and Services                DME Arranged: Wheelchair manual DME Agency: [facility] Date DME Agency Contacted: 10/26/19 Time DME Agency Contacted: 38 Representative spoke with at DME Agency: [place]: PT, RN [place] Agency: [place] Date [place]: 10/26/19 Time [place]: 58 Representative spoke with at [place]: [place] ([place]) Interventions     Readmission Risk Interventions No flowsheet data found.

## 2019-10-26 NOTE — Progress Notes (Signed)
    Durable Medical Equipment  (From admission, onward)         Start     Ordered   10/26/19 1202  For home use only DME standard manual wheelchair with seat cushion  Once    Comments: Patient suffers from chronic systolic which impairs their ability to perform daily activities like toileting in the home.  A cane will not resolve issue with performing activities of daily living. A wheelchair will allow patient to safely perform daily activities. Patient can safely propel the wheelchair in the home or has a caregiver who can provide assistance. Length of need Lifetime. Accessories: elevating leg rests (ELRs), wheel locks, extensions and anti-tippers.   10/26/19 1201   10/26/19 1200  Heart failure home health orders  (Heart failure home health orders / Face to face)  Once    Comments: Heart Failure Follow-up Care:  Verify follow-up appointments per Patient Discharge Instructions. Confirm transportation arranged. Reconcile home medications with discharge medication list. Remove discontinued medications from use. Assist patient/caregiver to manage medications using pill box. Reinforce low sodium food selection Assessments: Vital signs and oxygen saturation at each visit. Assess home environment for safety concerns, caregiver support and availability of low-sodium foods. Consult Education officer, museum, PT/OT, Dietitian, and CNA based on assessments. Perform comprehensive cardiopulmonary assessment. Notify MD for any change in condition or weight gain of 3 pounds in one day or 5 pounds in one week with symptoms. Daily Weights and Symptom Monitoring: Ensure patient has access to scales. Teach patient/caregiver to weigh daily before breakfast and after voiding using same scale and record.    Teach patient/caregiver to track weight and symptoms and when to notify Provider. Activity: Develop individualized activity plan with patient/caregiver.   Question Answer Comment  Heart Failure Follow-up Care Or per  Doctor (see comments)   Obtain the following labs Basic Metabolic Panel   Lab frequency Other see comments   Fax lab results to Other see comments   Diet Low Sodium Heart Healthy   Fluid restrictions: 1800 mL Fluid      10/26/19 1201

## 2019-10-26 NOTE — Discharge Summary (Signed)
ELECTROPHYSIOLOGY DISCHARGE SUMMARY    Patient ID: Sydney FORRISTER,  MRN: [id number], DOB/AGE: 1935-12-07 84 y.o.  Admit date: 10/22/2019 Discharge date: 10/26/2019  Primary Care Physician: Laurey Morale, MD  Primary Cardiologist: [name], MD  Electrophysiologist: Dr. Curt Bears  Primary Discharge Diagnosis:  VF with ICD shocks Acute on chronic systolic CHF  Secondary Discharge Diagnosis:  CAD Mixed ischemic/non-ischemic cardiomyopathy CKD III  Procedures This Admission:  [place] 10/24/2019   Moderate three-vessel coronary disease.  30% ostial left main.  60% ostial LAD (unchanged from 2017) with moderate mid LAD plaque and calcification.  Patent proximal to mid circumflex with diffuse 50% in-stent restenosis.  Ostial circumflex contains 30 to 40% narrowing.  RCA contains up to 50% diffuse in-stent restenosis.  Proximal to the stented segment is an eccentric 70% region of narrowing  Elevated LVEDP, 31 mmHg suggesting decompensated acute on chronic combined systolic heart failure.  Catheterization from right radial was compromised by difficulty with catheter advancement, most likely due to selection of the radial recurrent route to the subclavian artery requiring that catheter size be downgraded to 4 [place].  Brief HPI: Sydney Phillips is a 84 y.o. female with a past medical history significant for chronic systolic CHF, CAD, and recurrent VF was admitted for ICD shocks.    Hospital Course:  The patient was admitted 10/22/2019 with ICD discharge x 4. She was in her USOH prior to this. It was suspected that most likely pt had worsening CAD with h/o stenting. No programming changes needed to be made to her device.  Pt reloaded on IV amiodarone. She had stopped her amiodarone herself in April.   Pt had recurrent VT and ICD shock 10/23/2019, Mg and K were both low and supped.   Pt underwent LHC as above which showed no high grade thrombotic appearing lesions and 3vD.  Recommended GDMT for her CHF and AHF team follow up.   Pt more SOB on 5/11, and given IV lasix in the setting of recurrent HF s/p hydration for cath. She felt much better after diuresis.  Lasix 160 mg BID transitioned to torsemide 80 mg BID on discharge.   She will have very close follow up next week, and then will be referred to CHF team.  The patient was examined by Dr. Lovena Le this am and determined stable for discharge. Stressed importance of compliance with amiodarone.   Physical Exam: Vitals:   10/25/19 2339 10/26/19 0305 10/26/19 0320 10/26/19 0821  BP: (!) 147/74 (!) 161/80  124/60  Pulse: 62 67  73  Resp: 20 20  18   Temp: 97.8 F (36.6 C) 98.2 F (36.8 C)  97.8 F (36.6 C)  TempSrc: Oral Oral  Oral  SpO2: 95% 94%  93%  Weight:   88 kg   Height:        GEN- The patient is well appearing, alert and oriented x 3 today.   HEENT: normocephalic, atraumatic; sclera clear, conjunctiva pink; hearing intact; oropharynx clear; neck supple  Lungs- Clear to ausculation bilaterally, normal work of breathing.  No wheezes, rales, rhonchi Heart- Regular rate and rhythm, no murmurs, rubs or gallops  GI- soft, non-tender, non-distended, bowel sounds present  Extremities- no clubbing, cyanosis, or edema; DP/PT/radial pulses 2+ bilaterally, groin without hematoma/bruit MS- no significant deformity or atrophy Skin- warm and dry, no rash or lesion Psych- euthymic mood, full affect Neuro- strength and sensation are intact  Labs:   Lab Results  Component Value Date   WBC 11.3 (  H) 10/25/2019   HGB 13.6 10/25/2019   HCT 42.3 10/25/2019   MCV 98.4 10/25/2019   PLT 265 10/25/2019    Recent Labs  Lab 10/22/19 0325 10/22/19 1700 10/26/19 0850  NA 143   < > 136  K 3.2*   < > 3.9  CL 100   < > 100  CO2 28   < > 25  BUN 20   < > 30*  CREATININE 1.45*   < > 1.31*  CALCIUM 9.3   < > 9.0  PROT 6.7  --   --   BILITOT 0.6  --   --   ALKPHOS 81  --   --   ALT 14  --   --   AST 18  --   --    GLUCOSE 170*   < > 276*   < > = values in this interval not displayed.     Discharge Medications:  Allergies as of 10/26/2019      Reactions   Potassium-containing Compounds Other (See Comments)   Causes severe constipation   Neomycin-polymyxin B Gu    Swollen throat    Other Other (See Comments)   "mycin" antibiotics cause sleeplessness and itching   Azithromycin Rash   Codeine Itching   Darvon Itching   Erythromycin Rash   Meloxicam Other (See Comments)   Unknown    Norco [hydrocodone-acetaminophen] Itching   Penicillins Rash, Other (See Comments)   Has patient had a PCN reaction causing immediate rash, facial/tongue/throat swelling, SOB or lightheadedness with hypotension: unknown Has patient had a PCN reaction causing severe rash involving mucus membranes or skin necrosis: no Has patient had a PCN reaction that required hospitalization: unknown Has patient had a PCN reaction occurring within the last 10 years: no If all of the above answers are "NO", then may proceed with Cephalosporin use.   Propoxyphene N-acetaminophen Itching   Rofecoxib Other (See Comments)   Unknown    Rosuvastatin Other (See Comments)   cramps   Statins Itching, Other (See Comments)   Sleeplessness Sleeplessness   Sulfa Antibiotics Rash      Medication List    STOP taking these medications   furosemide 80 MG tablet Commonly known as: LASIX     TAKE these medications   Accu-Chek FastClix Lancets Misc USE   TO CHECK GLUCOSE ONCE DAILY   Accu-Chek SmartView test strip Generic drug: glucose blood USE  STRIP TO CHECK GLUCOSE ONCE DAILY   albuterol (2.5 MG/3ML) 0.083% nebulizer solution Commonly known as: PROVENTIL USE 1 VIAL IN NEBULIZER EVERY 4 HOURS AS NEEDED FOR WHEEZING FOR SHORTNESS OF BREATH What changed: See the new instructions.   ProAir HFA 108 (90 Base) MCG/ACT inhaler Generic drug: albuterol INHALE 2 PUFFS BY MOUTH EVERY 4 HOURS AS NEEDED FOR WHEEZING AND FOR SHORTNESS OF  BREATH What changed: See the new instructions.   allopurinol 300 MG tablet Commonly known as: ZYLOPRIM Take 1 tablet (300 mg total) by mouth daily.   ALPRAZolam 1 MG tablet Commonly known as: XANAX TAKE 1 TABLET BY MOUTH IN THE MORNING AND 1 & 1/2 (ONE & ONE-HALF) NIGHTLY AS NEEDED FOR ANXIETY What changed: See the new instructions.   amiodarone 200 MG tablet Commonly known as: PACERONE Take 1 tablet (200 mg total) by mouth 2 (two) times daily. What changed:   how much to take  how to take this  when to take this  additional instructions  Another medication with the same name was removed.  Continue taking this medication, and follow the directions you see here.   aspirin 81 MG chewable tablet Chew 1 tablet (81 mg total) by mouth daily.   azelastine 0.1 % nasal spray Commonly known as: ASTELIN USE 2 SPRAY(S) IN EACH NOSTRIL TWICE DAILY AS DIRECTED What changed: See the new instructions.   bisoprolol 5 MG tablet Commonly known as: ZEBETA Take 1 tablet (5 mg total) by mouth daily.   cholecalciferol 1000 units tablet Commonly known as: VITAMIN D Take 1,000 Units by mouth daily.   clopidogrel 75 MG tablet Commonly known as: PLAVIX Take 1 tablet by mouth once daily   cyanocobalamin 1000 MCG tablet Take 1,000 mcg by mouth daily.   DULoxetine 60 MG capsule Commonly known as: CYMBALTA Take 1 capsule (60 mg total) by mouth 2 (two) times daily.   Magnesium Oxide 400 MG Caps Take 1 capsule (400 mg total) by mouth daily.   nitroGLYCERIN 0.4 MG SL tablet Commonly known as: NITROSTAT Place 0.4 mg under the tongue every 5 (five) minutes as needed for chest pain. X 3 doses   potassium chloride SA 20 MEQ tablet Commonly known as: KLOR-CON Take 2 tablets (40 mEq total) by mouth 2 (two) times daily.   spironolactone 25 MG tablet Commonly known as: ALDACTONE Take 0.5 tablets (12.5 mg total) by mouth daily. Start taking on: Oct 27, 2019   torsemide 20 MG  tablet Commonly known as: DEMADEX Take 4 tablets (80 mg total) by mouth 2 (two) times daily.       Disposition:   Follow-up Information    Shirley Friar, PA-C Follow up on 10/31/2019.   Specialty: Physician Assistant Why: at [phone] for post hospital follow up Contact information: [place] [address] [phone]           Duration of Discharge Encounter: Greater than 30 minutes including physician time.  Jacalyn Lefevre, PA-C  10/26/2019 11:26 AM

## 2019-10-27 ENCOUNTER — Telehealth: Payer: Self-pay | Admitting: *Deleted

## 2019-10-27 NOTE — Telephone Encounter (Signed)
LM for [name] on the DPR to call back.

## 2019-10-27 NOTE — Telephone Encounter (Signed)
New Message:   Son said he needs somebody to go over pt's medicine list with him please. He need to know what she is taking and how much she is supposed to be taking for each medicine.Marland Kitchen

## 2019-10-28 NOTE — Telephone Encounter (Signed)
Pt seeing Oda Kilts PA Monday 10/31/19 will review meds at that time.

## 2019-10-30 DIAGNOSIS — J45909 Unspecified asthma, uncomplicated: Secondary | ICD-10-CM | POA: Diagnosis not present

## 2019-10-30 DIAGNOSIS — I2510 Atherosclerotic heart disease of native coronary artery without angina pectoris: Secondary | ICD-10-CM | POA: Diagnosis not present

## 2019-10-30 DIAGNOSIS — M549 Dorsalgia, unspecified: Secondary | ICD-10-CM | POA: Diagnosis not present

## 2019-10-30 DIAGNOSIS — K219 Gastro-esophageal reflux disease without esophagitis: Secondary | ICD-10-CM | POA: Diagnosis not present

## 2019-10-30 DIAGNOSIS — E1151 Type 2 diabetes mellitus with diabetic peripheral angiopathy without gangrene: Secondary | ICD-10-CM | POA: Diagnosis not present

## 2019-10-30 DIAGNOSIS — I272 Pulmonary hypertension, unspecified: Secondary | ICD-10-CM | POA: Diagnosis not present

## 2019-10-30 DIAGNOSIS — I255 Ischemic cardiomyopathy: Secondary | ICD-10-CM | POA: Diagnosis not present

## 2019-10-30 DIAGNOSIS — G4733 Obstructive sleep apnea (adult) (pediatric): Secondary | ICD-10-CM | POA: Diagnosis not present

## 2019-10-30 DIAGNOSIS — J439 Emphysema, unspecified: Secondary | ICD-10-CM | POA: Diagnosis not present

## 2019-10-30 DIAGNOSIS — M797 Fibromyalgia: Secondary | ICD-10-CM | POA: Diagnosis not present

## 2019-10-30 DIAGNOSIS — I4901 Ventricular fibrillation: Secondary | ICD-10-CM | POA: Diagnosis not present

## 2019-10-30 DIAGNOSIS — N183 Chronic kidney disease, stage 3 unspecified: Secondary | ICD-10-CM | POA: Diagnosis not present

## 2019-10-30 DIAGNOSIS — E785 Hyperlipidemia, unspecified: Secondary | ICD-10-CM | POA: Diagnosis not present

## 2019-10-30 DIAGNOSIS — K5909 Other constipation: Secondary | ICD-10-CM | POA: Diagnosis not present

## 2019-10-30 DIAGNOSIS — I5023 Acute on chronic systolic (congestive) heart failure: Secondary | ICD-10-CM | POA: Diagnosis not present

## 2019-10-30 DIAGNOSIS — M1990 Unspecified osteoarthritis, unspecified site: Secondary | ICD-10-CM | POA: Diagnosis not present

## 2019-10-30 DIAGNOSIS — G4700 Insomnia, unspecified: Secondary | ICD-10-CM | POA: Diagnosis not present

## 2019-10-30 DIAGNOSIS — G8929 Other chronic pain: Secondary | ICD-10-CM | POA: Diagnosis not present

## 2019-10-30 DIAGNOSIS — Z9981 Dependence on supplemental oxygen: Secondary | ICD-10-CM | POA: Diagnosis not present

## 2019-10-30 DIAGNOSIS — I428 Other cardiomyopathies: Secondary | ICD-10-CM | POA: Diagnosis not present

## 2019-10-30 DIAGNOSIS — I110 Hypertensive heart disease with heart failure: Secondary | ICD-10-CM | POA: Diagnosis not present

## 2019-10-30 DIAGNOSIS — E1122 Type 2 diabetes mellitus with diabetic chronic kidney disease: Secondary | ICD-10-CM | POA: Diagnosis not present

## 2019-10-30 NOTE — Progress Notes (Deleted)
Electrophysiology Office Note Date: 10/30/2019  ID:  [name], Schoenbachler 1935-06-26, MRN [id number]  PCP: Laurey Morale, MD Primary Cardiologist: [name], MD Electrophysiologist: [name], MD   CC: Routine ICD follow-up  Sydney Phillips is a 84 y.o. female seen today for Sydney Meredith Leeds, MD for post hospital follow up.  Since discharge from hospital the patient reports doing ***.  she denies chest pain, palpitations, dyspnea, PND, orthopnea, nausea, vomiting, dizziness, syncope, edema, weight gain, or early satiety. {He/she (caps):30048} has not had ICD shocks.   Device History: Medtronic BiV ICD implanted *** for *** History of appropriate therapy: Yes History of AAD therapy: Yes; currently on amiodarone   Past Medical History:  Diagnosis Date  . Anxiety   . Arthritis   . Asthma   . Automatic implantable cardioverter-defibrillator in situ   . Bronchitis   . CAD (coronary artery disease)    a. s/p DES to LCx/RCA 05/2016, ostial LAD disease.  . Cardiomyopathy ([place])   . Chronic back pain   . Chronic combined systolic and diastolic CHF (congestive heart failure) ([place])   . Chronic constipation   . Chronic pain   . CKD (chronic kidney disease), stage III   . Colon polyps 2003.  2015.   HP polyps 2003.  adnomas 2015.  required referal to [facility] for colonoscopic resection of flat polyps.   Marland Kitchen COPD (chronic obstructive pulmonary disease) ([place])   . Dementia ([place])   . Depression   . Depression with anxiety    takes Cymbalta daily  . Diabetes mellitus ([place])   . Dyspnea   . Early cataracts, bilateral   . Fibromyalgia   . GERD (gastroesophageal reflux disease)    was on meds but was taken off;now watches what she eats  . Hemorrhoids   . History of kidney stones   . Hx of colonic polyps   . Hyperlipemia    takes Crestor daily  . Hypertension    takes Amlodipine and Metoprolol daily  . Insomnia   . LBBB (left bundle branch block)    Stress test  09/03/2010, EF 55  . Myocardial infarction ([place])   . PAD (peripheral artery disease) ([facility])    Carotid, subclavian, and lower extremity beds, currently not symptomatic  . Presence of combination internal cardiac defibrillator (ICD) and pacemaker   . Presence of permanent cardiac pacemaker   . Pulmonary hypertension ([place])   . S/P angioplasty with stent, lt. subclavian 07/31/11 08/01/2011  . Subclavian arterial stenosis, lt, with PTA/STENT 07/31/11 08/01/2011  . Syncope 07/28/2011   EF - 50-55, moderate concentric hypertrophy in left ventricle  . Urinary incontinence   . Vertigo    takes Meclizine prn   Past Surgical History:  Procedure Laterality Date  . ABDOMINAL AORTAGRAM N/A 08/12/2013   Procedure: ABDOMINAL Maxcine Ham;  Surgeon: Elam Dutch, MD;  Location: [facility] CATH LAB;  Service: Cardiovascular;  Laterality: N/A;  . ABDOMINAL HYSTERECTOMY    . APPENDECTOMY    . BACK SURGERY  2012  . BIV ICD GENERTAOR CHANGE OUT Left 02/20/2012   Procedure: BIV ICD GENERTAOR CHANGE OUT;  Surgeon: [name], MD;  Location: [facility] CATH LAB;  Service: Cardiovascular;  Laterality: Left;  . CARDIAC CATHETERIZATION  12/01/2007   By Dr. Melvern Banker, left heart cath,   . CARDIAC CATHETERIZATION N/A 05/20/2016   Procedure: Right/Left Heart Cath and Coronary Angiography;  Surgeon: Sherren Mocha, MD;  Location: [place] CV LAB;  Service: Cardiovascular;  Laterality:  N/A;  . CARDIAC CATHETERIZATION N/A 05/20/2016   Procedure: Coronary Stent Intervention;  Surgeon: Sherren Mocha, MD;  Location: [place] CV LAB;  Service: Cardiovascular;  Laterality: N/A;  . CARDIAC DEFIBRILLATOR PLACEMENT  05/2008   By Dr Blanch Media, Medtronic CANNOT HAVE MRI's  . CAROTID ANGIOGRAM N/A 07/31/2011   Procedure: CAROTID ANGIOGRAM;  Surgeon: Lorretta Harp, MD;  Location: [facility] CATH LAB;  Service: Cardiovascular;  Laterality: N/A;  carotid angiogram and possible Lt SCA PTA  . COLONOSCOPY W/ POLYPECTOMY  12/2013  . CORONARY ANGIOPLASTY    .  ENDARTERECTOMY Left 11/08/2014   Procedure: LEFT CAROTID ENDARTERECTOMY WITH HEMASHIELD PATCH ANGIOPLASTY;  Surgeon: Elam Dutch, MD;  Location: [place];  Service: Vascular;  Laterality: Left;  . ESOPHAGOGASTRODUODENOSCOPY N/A 01/20/2014   Procedure: ESOPHAGOGASTRODUODENOSCOPY (EGD);  Surgeon: Jerene Bears, MD;  Location: [facility] ENDOSCOPY;  Service: Endoscopy;  Laterality: N/A;  . FEMORAL-POPLITEAL BYPASS GRAFT Right 10/12/2013   Procedure:   Femoral-Peroneal trunk  bypass with nonreversed greater saphenous vein graft;  Surgeon: Elam Dutch, MD;  Location: [place];  Service: Vascular;  Laterality: Right;  . GIVENS CAPSULE STUDY N/A 01/20/2014   Procedure: GIVENS CAPSULE STUDY;  Surgeon: Jerene Bears, MD;  Location: [place];  Service: Gastroenterology;  Laterality: N/A;  . ICD GENERATOR CHANGEOUT N/A 03/04/2017   Procedure: ICD Generator Changeout;  Surgeon: [name], MD;  Location: [place] CV LAB;  Service: Cardiovascular;  Laterality: N/A;  . INSERT / REPLACE / REMOVE PACEMAKER    . INTRAOPERATIVE ARTERIOGRAM Right 10/12/2013   Procedure: INTRA OPERATIVE ARTERIOGRAM;  Surgeon: Elam Dutch, MD;  Location: [place];  Service: Vascular;  Laterality: Right;  . LEFT HEART CATH AND CORONARY ANGIOGRAPHY N/A 10/24/2019   Procedure: LEFT HEART CATH AND CORONARY ANGIOGRAPHY;  Surgeon: Belva Crome, MD;  Location: [place] CV LAB;  Service: Cardiovascular;  Laterality: N/A;  . ORIF ELBOW FRACTURE  08/16/2011   Procedure: OPEN REDUCTION INTERNAL FIXATION (ORIF) ELBOW/OLECRANON FRACTURE;  Surgeon: Schuyler Amor, MD;  Location: [place];  Service: Orthopedics;  Laterality: Left;  . RENAL ANGIOGRAM N/A 08/12/2013   Procedure: RENAL ANGIOGRAM;  Surgeon: Elam Dutch, MD;  Location: [facility] CATH LAB;  Service: Cardiovascular;  Laterality: N/A;  . SUBCLAVIAN STENT PLACEMENT Left 07/31/2011   7x18 Genesis, balloon, with reduction of 90% ostial left subclavian artery stenosis to 0% with residual  excellent flow  . TONSILLECTOMY    . TUBAL LIGATION      Current Outpatient Medications  Medication Sig Dispense Refill  . ACCU-CHEK FASTCLIX LANCETS MISC USE   TO CHECK GLUCOSE ONCE DAILY 102 each 5  . ACCU-CHEK SMARTVIEW test strip USE  STRIP TO CHECK GLUCOSE ONCE DAILY 100 each 0  . albuterol (PROVENTIL) (2.5 MG/3ML) 0.083% nebulizer solution USE 1 VIAL IN NEBULIZER EVERY 4 HOURS AS NEEDED FOR WHEEZING FOR SHORTNESS OF BREATH (Patient taking differently: Take 2.5 mg by nebulization every 4 (four) hours as needed for wheezing. ) 225 mL 0  . allopurinol (ZYLOPRIM) 300 MG tablet Take 1 tablet (300 mg total) by mouth daily. 90 tablet 3  . ALPRAZolam (XANAX) 1 MG tablet TAKE 1 TABLET BY MOUTH IN THE MORNING AND 1 & 1/2 (ONE & ONE-HALF) NIGHTLY AS NEEDED FOR ANXIETY (Patient taking differently: Take 1.5 mg by mouth at bedtime as needed for sleep. ) 75 tablet 5  . amiodarone (PACERONE) 200 MG tablet Take 1 tablet (200 mg total) by mouth 2 (two) times daily. 60 tablet  6  . aspirin 81 MG chewable tablet Chew 1 tablet (81 mg total) by mouth daily. 30 tablet 1  . azelastine (ASTELIN) 0.1 % nasal spray USE 2 SPRAY(S) IN EACH NOSTRIL TWICE DAILY AS DIRECTED (Patient taking differently: Place 2 sprays into both nostrils 2 (two) times daily. ) 30 mL 0  . bisoprolol (ZEBETA) 5 MG tablet Take 1 tablet (5 mg total) by mouth daily. 30 tablet 6  . cholecalciferol (VITAMIN D) 1000 units tablet Take 1,000 Units by mouth daily.    . clopidogrel (PLAVIX) 75 MG tablet Take 1 tablet by mouth once daily 90 tablet 3  . cyanocobalamin 1000 MCG tablet Take 1,000 mcg by mouth daily.    . DULoxetine (CYMBALTA) 60 MG capsule Take 1 capsule (60 mg total) by mouth 2 (two) times daily. 270 capsule 3  . Magnesium Oxide 400 MG CAPS Take 1 capsule (400 mg total) by mouth daily. 180 capsule 3  . nitroGLYCERIN (NITROSTAT) 0.4 MG SL tablet Place 0.4 mg under the tongue every 5 (five) minutes as needed for chest pain. X 3 doses    .  potassium chloride SA (K-DUR) 20 MEQ tablet Take 2 tablets (40 mEq total) by mouth 2 (two) times daily. 360 tablet 3  . PROAIR HFA 108 (90 Base) MCG/ACT inhaler INHALE 2 PUFFS BY MOUTH EVERY 4 HOURS AS NEEDED FOR WHEEZING AND FOR SHORTNESS OF BREATH (Patient taking differently: Inhale 2 puffs into the lungs every 4 (four) hours as needed for wheezing. ) 27 g 0  . spironolactone (ALDACTONE) 25 MG tablet Take 0.5 tablets (12.5 mg total) by mouth daily. 15 tablet 6  . torsemide (DEMADEX) 20 MG tablet Take 4 tablets (80 mg total) by mouth 2 (two) times daily. 240 tablet 6   No current facility-administered medications for this visit.    Allergies:   Potassium-containing compounds, Neomycin-polymyxin b gu, Other, Azithromycin, Codeine, Darvon, Erythromycin, Meloxicam, Norco [hydrocodone-acetaminophen], Penicillins, Propoxyphene n-acetaminophen, Rofecoxib, Rosuvastatin, Statins, and Sulfa antibiotics   Social History: Social History   Socioeconomic History  . Marital status: Widowed    Spouse name: Not on file  . Number of children: 3  . Years of education: 52  . Highest education level: Not on file  Occupational History  . Occupation: Retired  Tobacco Use  . Smoking status: Former Smoker    Packs/day: 0.50    Years: 62.00    Pack years: 31.00    Types: Cigarettes    Quit date: 07/19/2017    Years since quitting: 2.2  . Smokeless tobacco: Never Used  Substance and Sexual Activity  . Alcohol use: No    Alcohol/week: 0.0 standard drinks  . Drug use: No  . Sexual activity: Never    Birth control/protection: Surgical  Other Topics Concern  . Not on file  Social History Narrative   Ok to share information with medical POA, Son Gabriel Carina   Right-handed   Caffeine: Pepsi   Social Determinants of Health   Financial Resource Strain:   . Difficulty of Paying Living Expenses:   Food Insecurity:   . Worried About Charity fundraiser in the Last Year:   . Arboriculturist in the Last  Year:   Transportation Needs:   . Film/video editor (Medical):   Marland Kitchen Lack of Transportation (Non-Medical):   Physical Activity:   . Days of Exercise per Week:   . Minutes of Exercise per Session:   Stress:   . Feeling of Stress :  Social Connections:   . Frequency of Communication with Friends and Family:   . Frequency of Social Gatherings with Friends and Family:   . Attends Religious Services:   . Active Member of Clubs or Organizations:   . Attends Archivist Meetings:   Marland Kitchen Marital Status:   Intimate Partner Violence:   . Fear of Current or Ex-Partner:   . Emotionally Abused:   Marland Kitchen Physically Abused:   . Sexually Abused:     Family History: Family History  Problem Relation Age of Onset  . CAD Father   . Heart disease Father   . Hyperlipidemia Father   . Heart disease Mother   . Deep vein thrombosis Son   . Hyperlipidemia Other   . Colon cancer Maternal Grandmother   . Cancer Sister        ovarian  . Diabetes Sister   . Heart disease Sister   . Anesthesia problems Neg Hx   . Hypotension Neg Hx   . Malignant hyperthermia Neg Hx   . Pseudochol deficiency Neg Hx     Review of Systems: All other systems reviewed and are otherwise negative except as noted above.   Physical Exam: There were no vitals filed for this visit.   GEN- The patient is well appearing, alert and oriented x 3 today.   HEENT: normocephalic, atraumatic; sclera clear, conjunctiva pink; hearing intact; oropharynx clear; neck supple, no JVP Lymph- no cervical lymphadenopathy Lungs- Clear to ausculation bilaterally, normal work of breathing.  No wheezes, rales, rhonchi Heart- Regular rate and rhythm, no murmurs, rubs or gallops, PMI not laterally displaced GI- soft, non-tender, non-distended, bowel sounds present, no hepatosplenomegaly Extremities- no clubbing, cyanosis, or edema; DP/PT/radial pulses 2+ bilaterally MS- no significant deformity or atrophy Skin- warm and dry, no rash or  lesion; ICD pocket well healed Psych- euthymic mood, full affect Neuro- strength and sensation are intact  ICD interrogation- reviewed in detail today,  See PACEART report  EKG:  EKG is not ordered today. The ekg ordered today shows ***  Recent Labs: 10/22/2019: ALT 14; B Natriuretic Peptide 769.0; Magnesium 1.8; TSH 6.117 10/25/2019: Hemoglobin 13.6; Platelets 265 10/26/2019: BUN 30; Creatinine, Ser 1.31; Potassium 3.9; Sodium 136   Wt Readings from Last 3 Encounters:  10/26/19 194 lb 0.1 oz (88 kg)  09/30/19 180 lb 9.6 oz (81.9 kg)  09/26/19 180 lb 9.6 oz (81.9 kg)     Other studies Reviewed: Additional studies/ records that were reviewed today include: Hospital notes, most recent echo ,most recent labs, previous remotes, previous EP office notes   Assessment and Plan:  1.  Chronic systolic dysfunction s/p Medtronic CRT-D  euvolemic today Stable on an appropriate medical regimen Normal ICD function See Pace Art report No changes today  2. VT/VF Continue amiodarone 200 mg BID  3. CAD Cath during recent admission with known 3 vessel disease. Medical therapy pursued   Current medicines are reviewed at length with the patient today.   The patient does not have concerns regarding her medicines.  The following changes were made today:  {NONE DEFAULTED:18576::"none"}  Labs/ tests ordered today include: *** No orders of the defined types were placed in this encounter.    Disposition:   Follow up with *** {gen number [id number] {TIME; UNITS DAY/WEEK/MONTH:19136}   Signed, Shirley Friar, PA-C  10/30/2019 9:54 AM  [facility] [address] [place] [address] [phone] (office) [phone] (fax)

## 2019-10-31 ENCOUNTER — Other Ambulatory Visit: Payer: Self-pay

## 2019-10-31 ENCOUNTER — Encounter: Payer: Medicare Other | Admitting: Student

## 2019-10-31 NOTE — Patient Outreach (Signed)
[facility]) Care Management  10/31/2019  Sydney Phillips [birth year] [id number]   Red emmi: Date of emmi call: 10/28/2019 Reason for alert:   Got discharge papers---no  Placed call to patient who answered and identified herself. She reports she did get her discharge papers. Report she has follow up planned with MD. Denies any problems or concerns today.  PLAN: no needs identified. Case closed.   Tomasa Rand, RN, BSN, CEN [facility] [employer] [phone]

## 2019-11-02 ENCOUNTER — Ambulatory Visit (INDEPENDENT_AMBULATORY_CARE_PROVIDER_SITE_OTHER): Payer: Medicare Other | Admitting: Student

## 2019-11-02 ENCOUNTER — Encounter: Payer: Self-pay | Admitting: Student

## 2019-11-02 ENCOUNTER — Other Ambulatory Visit: Payer: Self-pay

## 2019-11-02 ENCOUNTER — Telehealth: Payer: Self-pay | Admitting: Family Medicine

## 2019-11-02 VITALS — BP 122/72 | HR 74 | Ht 65.0 in | Wt 175.6 lb

## 2019-11-02 DIAGNOSIS — Z79899 Other long term (current) drug therapy: Secondary | ICD-10-CM

## 2019-11-02 DIAGNOSIS — I10 Essential (primary) hypertension: Secondary | ICD-10-CM

## 2019-11-02 DIAGNOSIS — Z720 Tobacco use: Secondary | ICD-10-CM

## 2019-11-02 DIAGNOSIS — Z95810 Presence of automatic (implantable) cardiac defibrillator: Secondary | ICD-10-CM

## 2019-11-02 DIAGNOSIS — I5042 Chronic combined systolic (congestive) and diastolic (congestive) heart failure: Secondary | ICD-10-CM

## 2019-11-02 DIAGNOSIS — I2510 Atherosclerotic heart disease of native coronary artery without angina pectoris: Secondary | ICD-10-CM

## 2019-11-02 DIAGNOSIS — I472 Ventricular tachycardia, unspecified: Secondary | ICD-10-CM

## 2019-11-02 LAB — CUP PACEART INCLINIC DEVICE CHECK
Battery Remaining Longevity: 17 mo
Battery Voltage: 2.94 V
Brady Statistic AP VP Percent: 5.51 %
Brady Statistic AP VS Percent: 0.04 %
Brady Statistic AS VP Percent: 94.08 %
Brady Statistic AS VS Percent: 0.38 %
Brady Statistic RA Percent Paced: 5.5 %
Brady Statistic RV Percent Paced: 98.85 %
Date Time Interrogation Session: 20210519131114
HighPow Impedance: 67 Ohm
HighPow Impedance: 81 Ohm
Implantable Lead Implant Date: 20091202
Implantable Lead Implant Date: 20091202
Implantable Lead Implant Date: 20091202
Implantable Lead Location: 753858
Implantable Lead Location: 753859
Implantable Lead Location: 753860
Implantable Lead Model: 185
Implantable Lead Model: 4194
Implantable Lead Model: 4470
Implantable Lead Serial Number: 308680
Implantable Lead Serial Number: 635533
Implantable Pulse Generator Implant Date: 20180919
Lead Channel Impedance Value: 399 Ohm
Lead Channel Impedance Value: 475 Ohm
Lead Channel Impedance Value: 513 Ohm
Lead Channel Impedance Value: 665 Ohm
Lead Channel Impedance Value: 817 Ohm
Lead Channel Impedance Value: 817 Ohm
Lead Channel Pacing Threshold Amplitude: 0.5 V
Lead Channel Pacing Threshold Amplitude: 0.625 V
Lead Channel Pacing Threshold Amplitude: 1.5 V
Lead Channel Pacing Threshold Pulse Width: 0.4 ms
Lead Channel Pacing Threshold Pulse Width: 0.4 ms
Lead Channel Pacing Threshold Pulse Width: 1 ms
Lead Channel Sensing Intrinsic Amplitude: 2.875 mV
Lead Channel Sensing Intrinsic Amplitude: 27.5 mV
Lead Channel Sensing Intrinsic Amplitude: 31.625 mV
Lead Channel Sensing Intrinsic Amplitude: 5.125 mV
Lead Channel Setting Pacing Amplitude: 1.5 V
Lead Channel Setting Pacing Amplitude: 2 V
Lead Channel Setting Pacing Amplitude: 2 V
Lead Channel Setting Pacing Pulse Width: 0.4 ms
Lead Channel Setting Pacing Pulse Width: 1 ms
Lead Channel Setting Sensing Sensitivity: 0.3 mV

## 2019-11-02 MED ORDER — NITROGLYCERIN 0.4 MG SL SUBL: 0.4000 mg | SUBLINGUAL_TABLET | SUBLINGUAL | 3 refills | Status: AC | PRN

## 2019-11-02 NOTE — Chronic Care Management (AMB) (Signed)
  Chronic Care Management   Note  11/02/2019 Name: PAYETON ROBERGE MRN: [id number] DOB: Jul 24, 1935  Zanib Duane Boeck is a 84 y.o. year old female who is a primary care patient of Laurey Morale, MD. I reached out to [name] by phone today in response to a referral sent by Ms. Otelia Limes Vasques's PCP, Laurey Morale, MD.   Ms. Dondiego was given information about Chronic Care Management services today including:  1. CCM service includes personalized support from designated clinical staff supervised by her physician, including individualized plan of care and coordination with other care providers 2. 24/7 contact phone numbers for assistance for urgent and routine care needs. 3. Service will only be billed when office clinical staff spend 20 minutes or more in a month to coordinate care. 4. Only one practitioner may furnish and bill the service in a calendar month. 5. The patient may stop CCM services at any time (effective at the end of the month) by phone call to the office staff.   Patient agreed to services and verbal consent obtained.   Follow up plan:   [place]

## 2019-11-02 NOTE — Progress Notes (Signed)
Electrophysiology Office Note Date: 11/02/2019  ID:  Sydney Phillips, DOB Oct 30, 1935, MRN [id number]  PCP: Laurey Morale, MD Primary Cardiologist: [name], MD Electrophysiologist: [name], MD   CC: Routine ICD follow-up  Sydney Phillips is a 84 y.o. female seen today for Sydney Meredith Leeds, MD for post hospital follow up.  Since discharge from hospital the patient reports doing very well. She was admitted for VF with shocks, in the setting of having stopped her amiodarone herself. Course complicated by A/C CHF. Lasix changed to torsemide on discharge. She states her breathing is "the best it's been in awhile". She denies chest pain, palpitations, dyspnea, PND, orthopnea, nausea, vomiting, dizziness, syncope, edema, weight gain, or early satiety. She has not had any further ICD shocks.   LHC 10/24/2019  Moderate three-vessel coronary disease.  30% ostial left main.  60% ostial LAD (unchanged from 2017) with moderate mid LAD plaque and calcification.  Patent proximal to mid circumflex with diffuse 50% in-stent restenosis. Ostial circumflex contains 30 to 40% narrowing.  RCA contains up to 50% diffuse in-stent restenosis. Proximal to the stented segment is an eccentric 70% region of narrowing  Elevated LVEDP, 31 mmHg suggesting decompensated acute on chronic combined systolic heart failure.  Catheterization from right radial was compromised by difficulty with catheter advancement, most likely due to selection of the radial recurrent route to the subclavian artery requiring that catheter size be downgraded to 4 [place].  Device History: Medtronic BiV ICD implanted 03/04/2017 for Ischemic heart disease History of appropriate therapy: Yes History of AAD therapy: Yes; currently on amiodarone   Past Medical History:  Diagnosis Date  . Anxiety   . Arthritis   . Asthma   . Automatic implantable cardioverter-defibrillator in situ   . Bronchitis   . CAD  (coronary artery disease)    a. s/p DES to LCx/RCA 05/2016, ostial LAD disease.  . Cardiomyopathy ([place])   . Chronic back pain   . Chronic combined systolic and diastolic CHF (congestive heart failure) ([place])   . Chronic constipation   . Chronic pain   . CKD (chronic kidney disease), stage III   . Colon polyps 2003.  2015.   HP polyps 2003.  adnomas 2015.  required referal to [facility] for colonoscopic resection of flat polyps.   Marland Kitchen COPD (chronic obstructive pulmonary disease) ([place])   . Dementia ([place])   . Depression   . Depression with anxiety    takes Cymbalta daily  . Diabetes mellitus ([place])   . Dyspnea   . Early cataracts, bilateral   . Fibromyalgia   . GERD (gastroesophageal reflux disease)    was on meds but was taken off;now watches what she eats  . Hemorrhoids   . History of kidney stones   . Hx of colonic polyps   . Hyperlipemia    takes Crestor daily  . Hypertension    takes Amlodipine and Metoprolol daily  . Insomnia   . LBBB (left bundle branch block)    Stress test 09/03/2010, EF 55  . Myocardial infarction ([place])   . PAD (peripheral artery disease) ([facility])    Carotid, subclavian, and lower extremity beds, currently not symptomatic  . Presence of combination internal cardiac defibrillator (ICD) and pacemaker   . Presence of permanent cardiac pacemaker   . Pulmonary hypertension ([place])   . S/P angioplasty with stent, lt. subclavian 07/31/11 08/01/2011  . Subclavian arterial stenosis, lt, with PTA/STENT 07/31/11 08/01/2011  . Syncope 07/28/2011  EF - 50-55, moderate concentric hypertrophy in left ventricle  . Urinary incontinence   . Vertigo    takes Meclizine prn   Past Surgical History:  Procedure Laterality Date  . ABDOMINAL AORTAGRAM N/A 08/12/2013   Procedure: ABDOMINAL Maxcine Ham;  Surgeon: Elam Dutch, MD;  Location: [facility] CATH LAB;  Service: Cardiovascular;  Laterality: N/A;  . ABDOMINAL HYSTERECTOMY    . APPENDECTOMY    . BACK SURGERY  2012  . BIV ICD  GENERTAOR CHANGE OUT Left 02/20/2012   Procedure: BIV ICD GENERTAOR CHANGE OUT;  Surgeon: [name], MD;  Location: [facility] CATH LAB;  Service: Cardiovascular;  Laterality: Left;  . CARDIAC CATHETERIZATION  12/01/2007   By Dr. Melvern Banker, left heart cath,   . CARDIAC CATHETERIZATION N/A 05/20/2016   Procedure: Right/Left Heart Cath and Coronary Angiography;  Surgeon: Sherren Mocha, MD;  Location: [place] CV LAB;  Service: Cardiovascular;  Laterality: N/A;  . CARDIAC CATHETERIZATION N/A 05/20/2016   Procedure: Coronary Stent Intervention;  Surgeon: Sherren Mocha, MD;  Location: [place] CV LAB;  Service: Cardiovascular;  Laterality: N/A;  . CARDIAC DEFIBRILLATOR PLACEMENT  05/2008   By Dr Blanch Media, Medtronic CANNOT HAVE MRI's  . CAROTID ANGIOGRAM N/A 07/31/2011   Procedure: CAROTID ANGIOGRAM;  Surgeon: Lorretta Harp, MD;  Location: [facility] CATH LAB;  Service: Cardiovascular;  Laterality: N/A;  carotid angiogram and possible Lt SCA PTA  . COLONOSCOPY W/ POLYPECTOMY  12/2013  . CORONARY ANGIOPLASTY    . ENDARTERECTOMY Left 11/08/2014   Procedure: LEFT CAROTID ENDARTERECTOMY WITH HEMASHIELD PATCH ANGIOPLASTY;  Surgeon: Elam Dutch, MD;  Location: [place];  Service: Vascular;  Laterality: Left;  . ESOPHAGOGASTRODUODENOSCOPY N/A 01/20/2014   Procedure: ESOPHAGOGASTRODUODENOSCOPY (EGD);  Surgeon: Jerene Bears, MD;  Location: [facility] ENDOSCOPY;  Service: Endoscopy;  Laterality: N/A;  . FEMORAL-POPLITEAL BYPASS GRAFT Right 10/12/2013   Procedure:   Femoral-Peroneal trunk  bypass with nonreversed greater saphenous vein graft;  Surgeon: Elam Dutch, MD;  Location: [place];  Service: Vascular;  Laterality: Right;  . GIVENS CAPSULE STUDY N/A 01/20/2014   Procedure: GIVENS CAPSULE STUDY;  Surgeon: Jerene Bears, MD;  Location: [place];  Service: Gastroenterology;  Laterality: N/A;  . ICD GENERATOR CHANGEOUT N/A 03/04/2017   Procedure: ICD Generator Changeout;  Surgeon: [name], MD;  Location: [place]  CV LAB;  Service: Cardiovascular;  Laterality: N/A;  . INSERT / REPLACE / REMOVE PACEMAKER    . INTRAOPERATIVE ARTERIOGRAM Right 10/12/2013   Procedure: INTRA OPERATIVE ARTERIOGRAM;  Surgeon: Elam Dutch, MD;  Location: [place];  Service: Vascular;  Laterality: Right;  . LEFT HEART CATH AND CORONARY ANGIOGRAPHY N/A 10/24/2019   Procedure: LEFT HEART CATH AND CORONARY ANGIOGRAPHY;  Surgeon: Belva Crome, MD;  Location: [place] CV LAB;  Service: Cardiovascular;  Laterality: N/A;  . ORIF ELBOW FRACTURE  08/16/2011   Procedure: OPEN REDUCTION INTERNAL FIXATION (ORIF) ELBOW/OLECRANON FRACTURE;  Surgeon: Schuyler Amor, MD;  Location: [place];  Service: Orthopedics;  Laterality: Left;  . RENAL ANGIOGRAM N/A 08/12/2013   Procedure: RENAL ANGIOGRAM;  Surgeon: Elam Dutch, MD;  Location: [facility] CATH LAB;  Service: Cardiovascular;  Laterality: N/A;  . SUBCLAVIAN STENT PLACEMENT Left 07/31/2011   7x18 Genesis, balloon, with reduction of 90% ostial left subclavian artery stenosis to 0% with residual excellent flow  . TONSILLECTOMY    . TUBAL LIGATION      Current Outpatient Medications  Medication Sig Dispense Refill  . ACCU-CHEK FASTCLIX LANCETS MISC USE  TO CHECK GLUCOSE ONCE DAILY 102 each 5  . ACCU-CHEK SMARTVIEW test strip USE  STRIP TO CHECK GLUCOSE ONCE DAILY 100 each 0  . albuterol (PROVENTIL) (2.5 MG/3ML) 0.083% nebulizer solution USE 1 VIAL IN NEBULIZER EVERY 4 HOURS AS NEEDED FOR WHEEZING FOR SHORTNESS OF BREATH 225 mL 0  . allopurinol (ZYLOPRIM) 300 MG tablet Take 1 tablet (300 mg total) by mouth daily. 90 tablet 3  . ALPRAZolam (XANAX) 1 MG tablet TAKE 1 TABLET BY MOUTH IN THE MORNING AND 1 & 1/2 (ONE & ONE-HALF) NIGHTLY AS NEEDED FOR ANXIETY 75 tablet 5  . amiodarone (PACERONE) 200 MG tablet Take 1 tablet (200 mg total) by mouth 2 (two) times daily. 60 tablet 6  . aspirin 81 MG chewable tablet Chew 1 tablet (81 mg total) by mouth daily. 30 tablet 1  . azelastine (ASTELIN) 0.1 %  nasal spray USE 2 SPRAY(S) IN EACH NOSTRIL TWICE DAILY AS DIRECTED 30 mL 0  . bisoprolol (ZEBETA) 5 MG tablet Take 1 tablet (5 mg total) by mouth daily. 30 tablet 6  . cholecalciferol (VITAMIN D) 1000 units tablet Take 1,000 Units by mouth daily.    . clopidogrel (PLAVIX) 75 MG tablet Take 1 tablet by mouth once daily 90 tablet 3  . cyanocobalamin 1000 MCG tablet Take 1,000 mcg by mouth daily.    . DULoxetine (CYMBALTA) 60 MG capsule Take 1 capsule (60 mg total) by mouth 2 (two) times daily. 270 capsule 3  . Magnesium Oxide 400 MG CAPS Take 1 capsule (400 mg total) by mouth daily. 180 capsule 3  . nitroGLYCERIN (NITROSTAT) 0.4 MG SL tablet Place 1 tablet (0.4 mg total) under the tongue every 5 (five) minutes as needed for chest pain. X 3 doses 25 tablet 3  . potassium chloride SA (K-DUR) 20 MEQ tablet Take 2 tablets (40 mEq total) by mouth 2 (two) times daily. 360 tablet 3  . PROAIR HFA 108 (90 Base) MCG/ACT inhaler INHALE 2 PUFFS BY MOUTH EVERY 4 HOURS AS NEEDED FOR WHEEZING AND FOR SHORTNESS OF BREATH 27 g 0  . spironolactone (ALDACTONE) 25 MG tablet Take 0.5 tablets (12.5 mg total) by mouth daily. 15 tablet 6  . torsemide (DEMADEX) 20 MG tablet Take 4 tablets (80 mg total) by mouth 2 (two) times daily. 240 tablet 6   No current facility-administered medications for this visit.    Allergies:   Potassium-containing compounds, Neomycin-polymyxin b gu, Other, Azithromycin, Codeine, Darvon, Erythromycin, Meloxicam, Norco [hydrocodone-acetaminophen], Penicillins, Propoxyphene n-acetaminophen, Rofecoxib, Rosuvastatin, Statins, and Sulfa antibiotics   Social History: Social History   Socioeconomic History  . Marital status: Widowed    Spouse name: Not on file  . Number of children: 3  . Years of education: 19  . Highest education level: Not on file  Occupational History  . Occupation: Retired  Tobacco Use  . Smoking status: Former Smoker    Packs/day: 0.50    Years: 62.00    Pack years:  31.00    Types: Cigarettes    Quit date: 07/19/2017    Years since quitting: 2.2  . Smokeless tobacco: Never Used  Substance and Sexual Activity  . Alcohol use: No    Alcohol/week: 0.0 standard drinks  . Drug use: No  . Sexual activity: Never    Birth control/protection: Surgical  Other Topics Concern  . Not on file  Social History Narrative   Ok to share information with medical POA, Son Gabriel Carina   Right-handed   Caffeine: Pepsi  Social Determinants of Health   Financial Resource Strain:   . Difficulty of Paying Living Expenses:   Food Insecurity:   . Worried About Charity fundraiser in the Last Year:   . Arboriculturist in the Last Year:   Transportation Needs:   . Film/video editor (Medical):   Marland Kitchen Lack of Transportation (Non-Medical):   Physical Activity:   . Days of Exercise per Week:   . Minutes of Exercise per Session:   Stress:   . Feeling of Stress :   Social Connections:   . Frequency of Communication with Friends and Family:   . Frequency of Social Gatherings with Friends and Family:   . Attends Religious Services:   . Active Member of Clubs or Organizations:   . Attends Archivist Meetings:   Marland Kitchen Marital Status:   Intimate Partner Violence:   . Fear of Current or Ex-Partner:   . Emotionally Abused:   Marland Kitchen Physically Abused:   . Sexually Abused:     Family History: Family History  Problem Relation Age of Onset  . CAD Father   . Heart disease Father   . Hyperlipidemia Father   . Heart disease Mother   . Deep vein thrombosis Son   . Hyperlipidemia Other   . Colon cancer Maternal Grandmother   . Cancer Sister        ovarian  . Diabetes Sister   . Heart disease Sister   . Anesthesia problems Neg Hx   . Hypotension Neg Hx   . Malignant hyperthermia Neg Hx   . Pseudochol deficiency Neg Hx     Review of Systems: All other systems reviewed and are otherwise negative except as noted above.   Physical Exam: Vitals:   11/02/19 1155    BP: 122/72  Pulse: 74  SpO2: 95%  Weight: 175 lb 9.6 oz (79.7 kg)  Height: 5\' 5"  (1.651 m)     GEN- The patient is well appearing, alert and oriented x 3 today.   HEENT: normocephalic, atraumatic; sclera clear, conjunctiva pink; hearing intact; oropharynx clear; neck supple, no JVP Lymph- no cervical lymphadenopathy Lungs- Clear to ausculation bilaterally, normal work of breathing.  No wheezes, rales, rhonchi Heart- Regular rate and rhythm, no murmurs, rubs or gallops, PMI not laterally displaced GI- soft, non-tender, non-distended, bowel sounds present, no hepatosplenomegaly Extremities- no clubbing, cyanosis, or edema; DP/PT/radial pulses 2+ bilaterally MS- no significant deformity or atrophy Skin- warm and dry, no rash or lesion; ICD pocket well healed Psych- euthymic mood, full affect Neuro- strength and sensation are intact  ICD interrogation- reviewed in detail today,  See PACEART report  EKG:  EKG is not ordered today. The ekg ordered 10/22/2019 shows A sensed V paced rhythm at 75 bpm, QRS 156 ms.  Recent Labs: 10/22/2019: ALT 14; B Natriuretic Peptide 769.0; Magnesium 1.8; TSH 6.117 10/25/2019: Hemoglobin 13.6; Platelets 265 10/26/2019: BUN 30; Creatinine, Ser 1.31; Potassium 3.9; Sodium 136   Wt Readings from Last 3 Encounters:  11/02/19 175 lb 9.6 oz (79.7 kg)  10/26/19 194 lb 0.1 oz (88 kg)  09/30/19 180 lb 9.6 oz (81.9 kg)     Other studies Reviewed: Additional studies/ records that were reviewed today include: Recent admission notes and procedures.    Assessment and Plan:  1.  Chronic systolic dysfunction s/p Medtronic CRT-D  Echo 08/24/2019 20-25% NYHA III symptoms chronically confounded by severe COPD Volume status stable on exam.  Continue torsemide 80 mg BID. BMET today.  Continue bisoprolol 5 mg daily Medical therapy has been limited by renal function and periods of orthostatic hypotension, which has previously led to syncope and falls (thought poor  candidate for Entresto especially with this) Normal ICD function See Pace Art report No changes today  2. VF No further since discharge Continue amiodarone 200 mg BID for now No culprit/target lesion on cath during recent admission  3. CAD Moderate 3vD Medical therapy and aggressive HF management recommended  4. CKD III Cr apepars 1.2 - 1.4 May be reasonable to try on low dose losartan pending blood work.   5. Tobacco abuse Severe COPD, continues to smoke at least 1/2 ppd.  Encouraged cessation.  Age and COPD preclude her from advanced therapies.  Current medicines are reviewed at length with the patient today.   The patient does not have concerns regarding her medicines.  The following changes were made today:  none  Labs/ tests ordered today include:  Orders Placed This Encounter  Procedures  . Basic Metabolic Panel (BMET)  . Magnesium   Disposition:   Follow up with me in 4 weeks.   Jacalyn Lefevre, PA-C  11/02/2019 1:09 PM  [place]  [address] [phone] (office) [phone] (fax)

## 2019-11-02 NOTE — Patient Instructions (Signed)
Medication Instructions:  none *If you need a refill on your cardiac medications before your next appointment, please call your pharmacy*   Lab Work: [place] If you have labs (blood work) drawn today and your tests are completely normal, you will receive your results only by: Marland Kitchen MyChart Message (if you have MyChart) OR . A paper copy in the mail If you have any lab test that is abnormal or we need to change your treatment, we will call you to review the results.   Testing/Procedures: none   Follow-Up: At [facility], you and your health needs are our priority.  As part of our continuing mission to provide you with exceptional heart care, we have created designated Provider Care Teams.  These Care Teams include your primary Cardiologist (physician) and Advanced Practice Providers (APPs -  Physician Assistants and Nurse Practitioners) who all work together to provide you with the care you need, when you need it.  We recommend signing up for the patient portal called "MyChart".  Sign up information is provided on this After Visit Summary.  MyChart is used to connect with patients for Virtual Visits (Telemedicine).  Patients are able to view lab/test results, encounter notes, upcoming appointments, etc.  Non-urgent messages can be sent to your provider as well.   To learn more about what you can do with MyChart, go to [email or web address].    Your next appointment:   4 week(s)  The format for your next appointment:   In Person  Provider:   Oda Kilts, PA   Other Instructions Remote monitoring is used to monitor your ICD from home. This monitoring reduces the number of office visits required to check your device to one time per year. It allows [place] to keep an eye on the functioning of your device to ensure it is working properly. You are scheduled for a device check from home on 11/09/19. You may send your transmission at any time that day. If you have a wireless  device, the transmission will be sent automatically. After your physician reviews your transmission, you will receive a postcard with your next transmission date.

## 2019-11-03 ENCOUNTER — Telehealth: Payer: Self-pay

## 2019-11-03 ENCOUNTER — Telehealth: Payer: Self-pay | Admitting: Student

## 2019-11-03 DIAGNOSIS — I472 Ventricular tachycardia, unspecified: Secondary | ICD-10-CM

## 2019-11-03 DIAGNOSIS — Z79899 Other long term (current) drug therapy: Secondary | ICD-10-CM

## 2019-11-03 DIAGNOSIS — E875 Hyperkalemia: Secondary | ICD-10-CM

## 2019-11-03 LAB — BASIC METABOLIC PANEL
BUN/Creatinine Ratio: 27 (ref 12–28)
BUN: 57 mg/dL — ABNORMAL HIGH (ref 8–27)
CO2: 24 mmol/L (ref 20–29)
Calcium: 10.5 mg/dL — ABNORMAL HIGH (ref 8.7–10.3)
Chloride: 91 mmol/L — ABNORMAL LOW (ref 96–106)
Creatinine, Ser: 2.11 mg/dL — ABNORMAL HIGH (ref 0.57–1.00)
GFR calc Af Amer: 25 mL/min/{1.73_m2} — ABNORMAL LOW (ref >59)
GFR calc non Af Amer: 21 mL/min/{1.73_m2} — ABNORMAL LOW (ref >59)
Glucose: 122 mg/dL — ABNORMAL HIGH (ref 65–99)
Potassium: 6.4 mmol/L (ref 3.5–5.2)
Sodium: 135 mmol/L (ref 134–144)

## 2019-11-03 LAB — MAGNESIUM: Magnesium: 2.5 mg/dL — ABNORMAL HIGH (ref 1.6–2.3)

## 2019-11-03 MED ORDER — TORSEMIDE 20 MG PO TABS
60.0000 mg | ORAL_TABLET | Freq: Two times a day (BID) | ORAL | 3 refills | Status: DC
Start: 2019-11-03 — End: 2019-11-17

## 2019-11-03 MED ORDER — POTASSIUM CHLORIDE ER 20 MEQ PO TBCR: 40.0000 meq | EXTENDED_RELEASE_TABLET | Freq: Every day | ORAL | 3 refills | Status: DC

## 2019-11-03 NOTE — Telephone Encounter (Signed)
-----   Message from Shirley Friar, PA-C sent at 11/03/2019  8:05 AM EDT ----- Pt over diuresed in setting of switch to torsemide.   Please have patient hold ALL potassium and diuretics today. STOP spironolactone for now.   She should go to ED for recheck labs in setting of AKI and hyperkalemia.    Would plan on resuming torsemide at 60 mg BID on Saturday am, with potassium 40 meq DAILY, and recheck BMET early next week.   Legrand Como [address] [place], PA-C  11/03/2019 8:04 AM

## 2019-11-03 NOTE — Telephone Encounter (Signed)
See previous phone note from 11/03/19. Patient is refusing to go to [facility] ED for blood work. She states she will come to the office and speak with Jonni Sanger if need be.

## 2019-11-03 NOTE — Telephone Encounter (Signed)
lpmtcb 5/20

## 2019-11-03 NOTE — Telephone Encounter (Signed)
I spoke to the patient's son with Andy's recommendation.  He verbalized understanding and will inform patient.

## 2019-11-03 NOTE — Telephone Encounter (Signed)
I spoke to the patient who had an OV with Jonni Sanger on 5/19.  Labs were drawn which resulted with a K of 6.4 and Creatinine of 2.11.  Jonni Sanger recommended medication changes and a visit to the ED for redraw labs today 5/20.  She was reluctant to go, but I explained that with these results, she is at risk for VT, organ damage and even up to death per Jonni Sanger.  She is willing to come in on 5/21 for STAT (BMET and MAGNESIUM), which was also requested by Jonni Sanger.

## 2019-11-04 ENCOUNTER — Emergency Department (HOSPITAL_COMMUNITY)
Admission: EM | Admit: 2019-11-04 | Discharge: 2019-11-04 | Disposition: A | Discharge status: Home / Self Care | Payer: Medicare Other | Attending: Emergency Medicine | Admitting: Emergency Medicine

## 2019-11-04 ENCOUNTER — Encounter (HOSPITAL_COMMUNITY): Payer: Self-pay | Admitting: Emergency Medicine

## 2019-11-04 ENCOUNTER — Encounter (INDEPENDENT_AMBULATORY_CARE_PROVIDER_SITE_OTHER): Payer: Self-pay

## 2019-11-04 ENCOUNTER — Telehealth: Payer: Self-pay

## 2019-11-04 ENCOUNTER — Telehealth: Payer: Self-pay | Admitting: Family Medicine

## 2019-11-04 ENCOUNTER — Other Ambulatory Visit: Payer: Self-pay

## 2019-11-04 ENCOUNTER — Other Ambulatory Visit: Payer: Medicare Other | Admitting: *Deleted

## 2019-11-04 DIAGNOSIS — R7989 Other specified abnormal findings of blood chemistry: Secondary | ICD-10-CM | POA: Diagnosis not present

## 2019-11-04 DIAGNOSIS — Z5321 Procedure and treatment not carried out due to patient leaving prior to being seen by health care provider: Secondary | ICD-10-CM | POA: Diagnosis not present

## 2019-11-04 DIAGNOSIS — Z79899 Other long term (current) drug therapy: Secondary | ICD-10-CM | POA: Diagnosis not present

## 2019-11-04 DIAGNOSIS — I472 Ventricular tachycardia, unspecified: Secondary | ICD-10-CM

## 2019-11-04 DIAGNOSIS — I10 Essential (primary) hypertension: Secondary | ICD-10-CM

## 2019-11-04 DIAGNOSIS — E875 Hyperkalemia: Secondary | ICD-10-CM

## 2019-11-04 LAB — CBC
HCT: 49.6 % — ABNORMAL HIGH (ref 36.0–46.0)
Hemoglobin: 15.8 g/dL — ABNORMAL HIGH (ref 12.0–15.0)
MCH: 31.5 pg (ref 26.0–34.0)
MCHC: 31.9 g/dL (ref 30.0–36.0)
MCV: 99 fL (ref 80.0–100.0)
Platelets: 417 10*3/uL — ABNORMAL HIGH (ref 150–400)
RBC: 5.01 MIL/uL (ref 3.87–5.11)
RDW: 17.2 % — ABNORMAL HIGH (ref 11.5–15.5)
WBC: 11.9 10*3/uL — ABNORMAL HIGH (ref 4.0–10.5)
nRBC: 0 % (ref 0.0–0.2)

## 2019-11-04 LAB — BASIC METABOLIC PANEL
Anion gap: 13 (ref 5–15)
BUN/Creatinine Ratio: 28 (ref 12–28)
BUN: 74 mg/dL — ABNORMAL HIGH (ref 8–27)
BUN: 74 mg/dL — ABNORMAL HIGH (ref 8–23)
CO2: 29 mmol/L (ref 20–29)
CO2: 25 mmol/L (ref 22–32)
Calcium: 10 mg/dL (ref 8.7–10.3)
Calcium: 9.7 mg/dL (ref 8.9–10.3)
Chloride: 93 mmol/L — ABNORMAL LOW (ref 96–106)
Chloride: 96 mmol/L — ABNORMAL LOW (ref 98–111)
Creatinine, Ser: 2.66 mg/dL — ABNORMAL HIGH (ref 0.57–1.00)
Creatinine, Ser: 2.62 mg/dL — ABNORMAL HIGH (ref 0.44–1.00)
GFR calc Af Amer: 19 mL/min/{1.73_m2} — ABNORMAL LOW (ref >59)
GFR calc Af Amer: 19 mL/min — ABNORMAL LOW (ref >60)
GFR calc non Af Amer: 16 mL/min/{1.73_m2} — ABNORMAL LOW (ref >59)
GFR calc non Af Amer: 16 mL/min — ABNORMAL LOW (ref >60)
Glucose, Bld: 149 mg/dL — ABNORMAL HIGH (ref 70–99)
Glucose: 184 mg/dL — ABNORMAL HIGH (ref 65–99)
Potassium: 5.9 mmol/L (ref 3.5–5.2)
Potassium: 5.2 mmol/L — ABNORMAL HIGH (ref 3.5–5.1)
Sodium: 134 mmol/L (ref 134–144)
Sodium: 134 mmol/L — ABNORMAL LOW (ref 135–145)

## 2019-11-04 LAB — MAGNESIUM: Magnesium: 2.7 mg/dL — ABNORMAL HIGH (ref 1.6–2.3)

## 2019-11-04 NOTE — ED Triage Notes (Signed)
Pt arrives here today due to abnormal creatine and potassium- pt was here on 5/19 for same but was not able to stay to see MD.

## 2019-11-04 NOTE — Telephone Encounter (Signed)
-----   Message from Shirley Friar, PA-C sent at 11/04/2019 12:42 PM EDT ----- Her kidney function has continued to trend up despite holding diuretics.  She should proceed to ED as originally instructed. Please stress risk of permanent kidney damage if she remains at home without further work up.   Legrand Como [address]" [place], PA-C  11/04/2019 12:41 PM

## 2019-11-04 NOTE — Discharge Planning (Signed)
Pt currently active with [facility] for home health services.  Resumption of care requested.

## 2019-11-04 NOTE — Telephone Encounter (Signed)
Mickel Baas from home Health stateed the pt missed her PT appt today and needed to r/s for next week. Mickel Baas was calling to keep PCP in loop.   Mickel Baas can be reached at [phone]

## 2019-11-04 NOTE — ED Notes (Signed)
Pt stated she was leaving and would come back to get seen another time and that she has been here 3 times for the same. Informed pt that staff would like her to stay and get seen by EDP. Pt verbalized understanding that leaving would remove her from queue of being seen by EDP and she would have to start process over again. This NT re-stated to pt that we wanted her to get seen. Pt verbalized understanding and left.

## 2019-11-04 NOTE — Telephone Encounter (Signed)
lpmtcb 5/21

## 2019-11-04 NOTE — Telephone Encounter (Signed)
Transferred call to Sydney Phillips.

## 2019-11-04 NOTE — Telephone Encounter (Signed)
Sydney Phillips is calling stating she wanted to inform Legrand Como that she went to the hospital and got her blood drawn and was unable to give a urine sample due to not drinking enough today so she left.

## 2019-11-04 NOTE — Telephone Encounter (Signed)
I spoke to the patient and explained the importance of her going to the ED with her Creatinine and Potassium above normal value.  I stressed the fact that she is risking kidney damage by not pursuing the ED.    She feels that if she goes, she will sit for hours before being seen, but I pointed out that with these values, things should be expedited.  It sounds like she may reconsider and have her son take her.  Will continue to follow.

## 2019-11-04 NOTE — Telephone Encounter (Signed)
The patient's son has been notified of the result and verbalized understanding.  All questions (if any) were answered. Frederik Schmidt, RN 11/04/2019 12:49 PM   I strongly encouraged that she go to the ED for further evaluation with a K of 5.9 and Creatinine of 2.66, stressing that the kidney function is worsening.  I told him that she is at high risk for organ damage if she remains at home and is untreated.  He will reach out to her.

## 2019-11-07 ENCOUNTER — Other Ambulatory Visit: Payer: Medicare Other | Admitting: *Deleted

## 2019-11-07 ENCOUNTER — Other Ambulatory Visit: Payer: Self-pay

## 2019-11-07 DIAGNOSIS — I10 Essential (primary) hypertension: Secondary | ICD-10-CM | POA: Diagnosis not present

## 2019-11-07 DIAGNOSIS — Z79899 Other long term (current) drug therapy: Secondary | ICD-10-CM | POA: Diagnosis not present

## 2019-11-07 LAB — CUP PACEART REMOTE DEVICE CHECK
Battery Remaining Longevity: 17 mo
Battery Voltage: 2.95 V
Brady Statistic AP VP Percent: 10.02 %
Brady Statistic AP VS Percent: 0.02 %
Brady Statistic AS VP Percent: 89.93 %
Brady Statistic AS VS Percent: 0.03 %
Brady Statistic RA Percent Paced: 10.03 %
Brady Statistic RV Percent Paced: 99.81 %
Date Time Interrogation Session: 20210524001804
HighPow Impedance: 57 Ohm
HighPow Impedance: 67 Ohm
Implantable Lead Implant Date: 20091202
Implantable Lead Implant Date: 20091202
Implantable Lead Implant Date: 20091202
Implantable Lead Location: 753858
Implantable Lead Location: 753859
Implantable Lead Location: 753860
Implantable Lead Model: 185
Implantable Lead Model: 4194
Implantable Lead Model: 4470
Implantable Lead Serial Number: 308680
Implantable Lead Serial Number: 635533
Implantable Pulse Generator Implant Date: 20180919
Lead Channel Impedance Value: 342 Ohm
Lead Channel Impedance Value: 456 Ohm
Lead Channel Impedance Value: 456 Ohm
Lead Channel Impedance Value: 665 Ohm
Lead Channel Impedance Value: 703 Ohm
Lead Channel Impedance Value: 722 Ohm
Lead Channel Pacing Threshold Amplitude: 0.625 V
Lead Channel Pacing Threshold Amplitude: 0.625 V
Lead Channel Pacing Threshold Amplitude: 1.75 V
Lead Channel Pacing Threshold Pulse Width: 0.4 ms
Lead Channel Pacing Threshold Pulse Width: 0.4 ms
Lead Channel Pacing Threshold Pulse Width: 1 ms
Lead Channel Sensing Intrinsic Amplitude: 27.625 mV
Lead Channel Sensing Intrinsic Amplitude: 27.625 mV
Lead Channel Sensing Intrinsic Amplitude: 3.375 mV
Lead Channel Sensing Intrinsic Amplitude: 3.375 mV
Lead Channel Setting Pacing Amplitude: 1.5 V
Lead Channel Setting Pacing Amplitude: 2 V
Lead Channel Setting Pacing Amplitude: 2 V
Lead Channel Setting Pacing Pulse Width: 0.4 ms
Lead Channel Setting Pacing Pulse Width: 1 ms
Lead Channel Setting Sensing Sensitivity: 0.3 mV

## 2019-11-07 NOTE — Telephone Encounter (Signed)
  Pt needs repeat labwork today to make sure Creatinine has trended down.  Needs follow up with APP in office this week if possible; Gen cards OK vs EP with HF/AKI issues.   Legrand Como [address] [place], PA-C  11/07/2019 9:07 AM

## 2019-11-07 NOTE — Telephone Encounter (Signed)
I spoke to the patient and she is willing to come in later today to check kidney function.  I told her that we would reach out to her and schedule appointment later this week.

## 2019-11-07 NOTE — Addendum Note (Signed)
Addended by: Frederik Schmidt on: 11/07/2019 09:23 AM   Modules accepted: Orders

## 2019-11-08 ENCOUNTER — Telehealth: Payer: Self-pay

## 2019-11-08 ENCOUNTER — Other Ambulatory Visit: Payer: Self-pay

## 2019-11-08 ENCOUNTER — Telehealth: Payer: Self-pay | Admitting: Student

## 2019-11-08 DIAGNOSIS — E785 Hyperlipidemia, unspecified: Secondary | ICD-10-CM | POA: Diagnosis not present

## 2019-11-08 DIAGNOSIS — K219 Gastro-esophageal reflux disease without esophagitis: Secondary | ICD-10-CM | POA: Diagnosis not present

## 2019-11-08 DIAGNOSIS — G4733 Obstructive sleep apnea (adult) (pediatric): Secondary | ICD-10-CM | POA: Diagnosis not present

## 2019-11-08 DIAGNOSIS — I110 Hypertensive heart disease with heart failure: Secondary | ICD-10-CM | POA: Diagnosis not present

## 2019-11-08 DIAGNOSIS — M1990 Unspecified osteoarthritis, unspecified site: Secondary | ICD-10-CM | POA: Diagnosis not present

## 2019-11-08 DIAGNOSIS — E1151 Type 2 diabetes mellitus with diabetic peripheral angiopathy without gangrene: Secondary | ICD-10-CM | POA: Diagnosis not present

## 2019-11-08 DIAGNOSIS — K5909 Other constipation: Secondary | ICD-10-CM | POA: Diagnosis not present

## 2019-11-08 DIAGNOSIS — I2510 Atherosclerotic heart disease of native coronary artery without angina pectoris: Secondary | ICD-10-CM | POA: Diagnosis not present

## 2019-11-08 DIAGNOSIS — G8929 Other chronic pain: Secondary | ICD-10-CM | POA: Diagnosis not present

## 2019-11-08 DIAGNOSIS — M797 Fibromyalgia: Secondary | ICD-10-CM | POA: Diagnosis not present

## 2019-11-08 DIAGNOSIS — M549 Dorsalgia, unspecified: Secondary | ICD-10-CM | POA: Diagnosis not present

## 2019-11-08 DIAGNOSIS — I272 Pulmonary hypertension, unspecified: Secondary | ICD-10-CM | POA: Diagnosis not present

## 2019-11-08 DIAGNOSIS — E1122 Type 2 diabetes mellitus with diabetic chronic kidney disease: Secondary | ICD-10-CM | POA: Diagnosis not present

## 2019-11-08 DIAGNOSIS — I4901 Ventricular fibrillation: Secondary | ICD-10-CM | POA: Diagnosis not present

## 2019-11-08 DIAGNOSIS — G4700 Insomnia, unspecified: Secondary | ICD-10-CM | POA: Diagnosis not present

## 2019-11-08 DIAGNOSIS — Z9981 Dependence on supplemental oxygen: Secondary | ICD-10-CM | POA: Diagnosis not present

## 2019-11-08 DIAGNOSIS — Z79899 Other long term (current) drug therapy: Secondary | ICD-10-CM

## 2019-11-08 DIAGNOSIS — I10 Essential (primary) hypertension: Secondary | ICD-10-CM

## 2019-11-08 DIAGNOSIS — N183 Chronic kidney disease, stage 3 unspecified: Secondary | ICD-10-CM | POA: Diagnosis not present

## 2019-11-08 DIAGNOSIS — J45909 Unspecified asthma, uncomplicated: Secondary | ICD-10-CM | POA: Diagnosis not present

## 2019-11-08 DIAGNOSIS — I5023 Acute on chronic systolic (congestive) heart failure: Secondary | ICD-10-CM | POA: Diagnosis not present

## 2019-11-08 DIAGNOSIS — I428 Other cardiomyopathies: Secondary | ICD-10-CM | POA: Diagnosis not present

## 2019-11-08 DIAGNOSIS — I255 Ischemic cardiomyopathy: Secondary | ICD-10-CM | POA: Diagnosis not present

## 2019-11-08 DIAGNOSIS — J439 Emphysema, unspecified: Secondary | ICD-10-CM | POA: Diagnosis not present

## 2019-11-08 LAB — BASIC METABOLIC PANEL
BUN/Creatinine Ratio: 31 — ABNORMAL HIGH (ref 12–28)
BUN: 57 mg/dL — ABNORMAL HIGH (ref 8–27)
CO2: 22 mmol/L (ref 20–29)
Calcium: 10.4 mg/dL — ABNORMAL HIGH (ref 8.7–10.3)
Chloride: 95 mmol/L — ABNORMAL LOW (ref 96–106)
Creatinine, Ser: 1.86 mg/dL — ABNORMAL HIGH (ref 0.57–1.00)
GFR calc Af Amer: 29 mL/min/{1.73_m2} — ABNORMAL LOW (ref >59)
GFR calc non Af Amer: 25 mL/min/{1.73_m2} — ABNORMAL LOW (ref >59)
Glucose: 117 mg/dL — ABNORMAL HIGH (ref 65–99)
Potassium: 5.9 mmol/L (ref 3.5–5.2)
Sodium: 136 mmol/L (ref 134–144)

## 2019-11-08 NOTE — Telephone Encounter (Signed)
lpmtcb 5/25 

## 2019-11-08 NOTE — Progress Notes (Unsigned)
Spoke to patient

## 2019-11-08 NOTE — Telephone Encounter (Signed)
The patient has been notified of the result and verbalized understanding.  All questions (if any) were answered. Frederik Schmidt, RN 11/08/2019 11:26 AM   I spoke to the patient and confirmed her medication dosage for Torsemide 60 mg (3 tablets) bid and told her to stop Potassium for now.  She has an appointment on Thursday with Renee @ 10:30, but I will bring her in for STAT BMET @ 9:30.

## 2019-11-08 NOTE — Telephone Encounter (Signed)
Follow Up:    Pt is returning your call. Closed previous encounter by mistake.

## 2019-11-08 NOTE — Telephone Encounter (Signed)
Follow Up:     Pt was returning your call from this morning. 

## 2019-11-08 NOTE — Telephone Encounter (Signed)
-----   Message from Shirley Friar, PA-C sent at 11/08/2019  7:26 AM EDT ----- Creatinine is gradually improving.   Please clarify how she is taking torsemide (should be 60 mg/3 tablets twice daily)  HOLD potassium (and please confirm she decreased to 40 meq/2 tablets daily previously as instructed)   Needs stat BMET early Friday am so can be addressed before weekend, and to decide on how much/if to resume potassium.   Legrand Como [address]" [place], PA-C  11/08/2019 7:25 AM

## 2019-11-08 NOTE — Telephone Encounter (Signed)
-----   Message from Shirley Friar, PA-C sent at 11/08/2019  7:26 AM EDT ----- Creatinine is gradually improving.   Please clarify how she is taking torsemide (should be 60 mg/3 tablets twice daily)  HOLD potassium (and please confirm she decreased to 40 meq/2 tablets daily previously as instructed)   Needs stat BMET early Friday am so can be addressed before weekend, and to decide on how much/if to resume potassium.   Legrand Como [address]" [place], PA-C  11/08/2019 7:25 AM

## 2019-11-09 ENCOUNTER — Ambulatory Visit (INDEPENDENT_AMBULATORY_CARE_PROVIDER_SITE_OTHER): Payer: Medicare Other | Admitting: *Deleted

## 2019-11-09 DIAGNOSIS — I472 Ventricular tachycardia, unspecified: Secondary | ICD-10-CM

## 2019-11-09 DIAGNOSIS — I509 Heart failure, unspecified: Secondary | ICD-10-CM

## 2019-11-09 NOTE — Progress Notes (Signed)
ICM remote transmission rescheduled for 11/28/2019 due to patient has in office defib check on 11/10/2019 with Tommye Standard, PA.

## 2019-11-09 NOTE — Progress Notes (Signed)
Cardiology Office Note Date:  11/10/2019  Patient ID:  Sydney Phillips 11/02/1935, MRN [id number] PCP:  Laurey Morale, MD  Cardiologist:  Dr. Sallyanne Kuster EP: Dr. Curt Bears    Chief Complaint: f/u on labs/medicines  History of Present Illness: Sydney Phillips is a 84 y.o. female with history of CAD, ICM, chronic CHF (combined), ICD, CKD (III), DM, HTN, HLD, LBBB, fibromyalgia, PVD (s/p L subclavia artery stenosis w/PTA/stent 2013), VF  She was hospitalized [facility] for VF with shocks, in the setting of having stopped her amiodarone herself. Course complicated by A/C CHF. Lasix changed to torsemide on discharge [facility] 10/24/2019  Moderate three-vessel coronary disease.  30% ostial left main.  60% ostial LAD (unchanged from 2017) with moderate mid LAD plaque and calcification.  Patent proximal to mid circumflex with diffuse 50% in-stent restenosis. Ostial circumflex contains 30 to 40% narrowing.  RCA contains up to 50% diffuse in-stent restenosis. Proximal to the stented segment is an eccentric 70% region of narrowing  Elevated LVEDP, 31 mmHg suggesting decompensated acute on chronic combined systolic heart failure.  Catheterization from right radial was compromised by difficulty with catheter advancement, most likely due to selection of the radial recurrent route to the subclavian artery requiring that catheter size be downgraded to 4 [place] She saw A. Tillery, PA 11/02/19, she was feeling quite well. BMET updated, no changes to her therapy, noting CKD and orthostatic hypotension, syncope, falls have limited GDMT, bot felt candidate for Entresto. Urged to quit smoking, continued amio 200mg  BID, planned to see him in 4 weeks to f/u.  BMET noted K+ 6.4 and Creat 2.66 her K+ and diuretics held >> 5.2 and 2.62 >> 5.9 and 1.86 Seems she was instructed to go to the ER for redaw on labs though did not want to, repeat labs were done, again recommended to go to the ER, looks like she finally did  though left prior to being seen.  TODAY She comes with her son, Sydney Phillips, "one of her twins".  She feels very well, uncertain exactly what the urgency of all this is.  She denies any kind of CP, palpitations or cardiac awareness.  No rest SOB, she has some baseline DOE. No dizzy spells, near syncope or syncope No further shocks.  She continues to smoke, no plans on quitting but reports she has cut back She wears O2 at night, has been for years, no symptoms of PND or orthopnea, says she sleeps quite well, has the head of her bed raised 30 degrees for years.   Device History: Medtronic BiV ICD implanted 03/04/2017   History of appropriate therapy: Yes History of AAD therapy: Yes; currently on amiodarone  Past Medical History:  Diagnosis Date  . Anxiety   . Arthritis   . Asthma   . Automatic implantable cardioverter-defibrillator in situ   . Bronchitis   . CAD (coronary artery disease)    a. s/p DES to LCx/RCA 05/2016, ostial LAD disease.  . Cardiomyopathy ([place])   . Chronic back pain   . Chronic combined systolic and diastolic CHF (congestive heart failure) ([place])   . Chronic constipation   . Chronic pain   . CKD (chronic kidney disease), stage III   . Colon polyps 2003.  2015.   HP polyps 2003.  adnomas 2015.  required referal to [facility] for colonoscopic resection of flat polyps.   Marland Kitchen COPD (chronic obstructive pulmonary disease) ([place])   . Dementia ([place])   . Depression   . Depression with  anxiety    takes Cymbalta daily  . Diabetes mellitus ([place])   . Dyspnea   . Early cataracts, bilateral   . Fibromyalgia   . GERD (gastroesophageal reflux disease)    was on meds but was taken off;now watches what she eats  . Hemorrhoids   . History of kidney stones   . Hx of colonic polyps   . Hyperlipemia    takes Crestor daily  . Hypertension    takes Amlodipine and Metoprolol daily  . Insomnia   . LBBB (left bundle branch block)    Stress test 09/03/2010, EF 55  . Myocardial infarction  ([place])   . PAD (peripheral artery disease) ([facility])    Carotid, subclavian, and lower extremity beds, currently not symptomatic  . Presence of combination internal cardiac defibrillator (ICD) and pacemaker   . Presence of permanent cardiac pacemaker   . Pulmonary hypertension ([place])   . S/P angioplasty with stent, lt. subclavian 07/31/11 08/01/2011  . Subclavian arterial stenosis, lt, with PTA/STENT 07/31/11 08/01/2011  . Syncope 07/28/2011   EF - 50-55, moderate concentric hypertrophy in left ventricle  . Urinary incontinence   . Vertigo    takes Meclizine prn    Past Surgical History:  Procedure Laterality Date  . ABDOMINAL AORTAGRAM N/A 08/12/2013   Procedure: ABDOMINAL Maxcine Ham;  Surgeon: Elam Dutch, MD;  Location: [facility] CATH LAB;  Service: Cardiovascular;  Laterality: N/A;  . ABDOMINAL HYSTERECTOMY    . APPENDECTOMY    . BACK SURGERY  2012  . BIV ICD GENERTAOR CHANGE OUT Left 02/20/2012   Procedure: BIV ICD GENERTAOR CHANGE OUT;  Surgeon: [name], MD;  Location: [facility] CATH LAB;  Service: Cardiovascular;  Laterality: Left;  . CARDIAC CATHETERIZATION  12/01/2007   By Dr. Melvern Banker, left heart cath,   . CARDIAC CATHETERIZATION N/A 05/20/2016   Procedure: Right/Left Heart Cath and Coronary Angiography;  Surgeon: Sherren Mocha, MD;  Location: [place] CV LAB;  Service: Cardiovascular;  Laterality: N/A;  . CARDIAC CATHETERIZATION N/A 05/20/2016   Procedure: Coronary Stent Intervention;  Surgeon: Sherren Mocha, MD;  Location: [place] CV LAB;  Service: Cardiovascular;  Laterality: N/A;  . CARDIAC DEFIBRILLATOR PLACEMENT  05/2008   By Dr Blanch Media, Medtronic CANNOT HAVE MRI's  . CAROTID ANGIOGRAM N/A 07/31/2011   Procedure: CAROTID ANGIOGRAM;  Surgeon: Lorretta Harp, MD;  Location: [facility] CATH LAB;  Service: Cardiovascular;  Laterality: N/A;  carotid angiogram and possible Lt SCA PTA  . COLONOSCOPY W/ POLYPECTOMY  12/2013  . CORONARY ANGIOPLASTY    . ENDARTERECTOMY Left 11/08/2014    Procedure: LEFT CAROTID ENDARTERECTOMY WITH HEMASHIELD PATCH ANGIOPLASTY;  Surgeon: Elam Dutch, MD;  Location: [place];  Service: Vascular;  Laterality: Left;  . ESOPHAGOGASTRODUODENOSCOPY N/A 01/20/2014   Procedure: ESOPHAGOGASTRODUODENOSCOPY (EGD);  Surgeon: Jerene Bears, MD;  Location: [facility] ENDOSCOPY;  Service: Endoscopy;  Laterality: N/A;  . FEMORAL-POPLITEAL BYPASS GRAFT Right 10/12/2013   Procedure:   Femoral-Peroneal trunk  bypass with nonreversed greater saphenous vein graft;  Surgeon: Elam Dutch, MD;  Location: [place];  Service: Vascular;  Laterality: Right;  . GIVENS CAPSULE STUDY N/A 01/20/2014   Procedure: GIVENS CAPSULE STUDY;  Surgeon: Jerene Bears, MD;  Location: [place];  Service: Gastroenterology;  Laterality: N/A;  . ICD GENERATOR CHANGEOUT N/A 03/04/2017   Procedure: ICD Generator Changeout;  Surgeon: [name], MD;  Location: [place] CV LAB;  Service: Cardiovascular;  Laterality: N/A;  . INSERT / REPLACE / REMOVE PACEMAKER    .  INTRAOPERATIVE ARTERIOGRAM Right 10/12/2013   Procedure: INTRA OPERATIVE ARTERIOGRAM;  Surgeon: Elam Dutch, MD;  Location: [place];  Service: Vascular;  Laterality: Right;  . LEFT HEART CATH AND CORONARY ANGIOGRAPHY N/A 10/24/2019   Procedure: LEFT HEART CATH AND CORONARY ANGIOGRAPHY;  Surgeon: Belva Crome, MD;  Location: [place] CV LAB;  Service: Cardiovascular;  Laterality: N/A;  . ORIF ELBOW FRACTURE  08/16/2011   Procedure: OPEN REDUCTION INTERNAL FIXATION (ORIF) ELBOW/OLECRANON FRACTURE;  Surgeon: Schuyler Amor, MD;  Location: [place];  Service: Orthopedics;  Laterality: Left;  . RENAL ANGIOGRAM N/A 08/12/2013   Procedure: RENAL ANGIOGRAM;  Surgeon: Elam Dutch, MD;  Location: [facility] CATH LAB;  Service: Cardiovascular;  Laterality: N/A;  . SUBCLAVIAN STENT PLACEMENT Left 07/31/2011   7x18 Genesis, balloon, with reduction of 90% ostial left subclavian artery stenosis to 0% with residual excellent flow  . TONSILLECTOMY    .  TUBAL LIGATION      Current Outpatient Medications  Medication Sig Dispense Refill  . ACCU-CHEK FASTCLIX LANCETS MISC USE   TO CHECK GLUCOSE ONCE DAILY 102 each 5  . ACCU-CHEK SMARTVIEW test strip USE  STRIP TO CHECK GLUCOSE ONCE DAILY 100 each 0  . albuterol (PROVENTIL) (2.5 MG/3ML) 0.083% nebulizer solution USE 1 VIAL IN NEBULIZER EVERY 4 HOURS AS NEEDED FOR WHEEZING FOR SHORTNESS OF BREATH 225 mL 0  . allopurinol (ZYLOPRIM) 300 MG tablet Take 1 tablet (300 mg total) by mouth daily. 90 tablet 3  . ALPRAZolam (XANAX) 1 MG tablet TAKE 1 TABLET BY MOUTH IN THE MORNING AND 1 & 1/2 (ONE & ONE-HALF) NIGHTLY AS NEEDED FOR ANXIETY 75 tablet 5  . amiodarone (PACERONE) 200 MG tablet Take 1 tablet (200 mg total) by mouth 2 (two) times daily. 60 tablet 6  . aspirin 81 MG chewable tablet Chew 1 tablet (81 mg total) by mouth daily. 30 tablet 1  . azelastine (ASTELIN) 0.1 % nasal spray USE 2 SPRAY(S) IN EACH NOSTRIL TWICE DAILY AS DIRECTED 30 mL 0  . bisoprolol (ZEBETA) 5 MG tablet Take 1 tablet (5 mg total) by mouth daily. 30 tablet 6  . cholecalciferol (VITAMIN D) 1000 units tablet Take 1,000 Units by mouth daily.    . clopidogrel (PLAVIX) 75 MG tablet Take 1 tablet by mouth once daily 90 tablet 3  . cyanocobalamin 1000 MCG tablet Take 1,000 mcg by mouth daily.    . DULoxetine (CYMBALTA) 60 MG capsule Take 1 capsule (60 mg total) by mouth 2 (two) times daily. 270 capsule 3  . Magnesium Oxide 400 MG CAPS Take 1 capsule (400 mg total) by mouth daily. 180 capsule 3  . nitroGLYCERIN (NITROSTAT) 0.4 MG SL tablet Place 1 tablet (0.4 mg total) under the tongue every 5 (five) minutes as needed for chest pain. X 3 doses 25 tablet 3  . PROAIR HFA 108 (90 Base) MCG/ACT inhaler INHALE 2 PUFFS BY MOUTH EVERY 4 HOURS AS NEEDED FOR WHEEZING AND FOR SHORTNESS OF BREATH 27 g 0  . torsemide (DEMADEX) 20 MG tablet Take 3 tablets (60 mg total) by mouth 2 (two) times daily. 180 tablet 3   No current facility-administered  medications for this visit.    Allergies:   Potassium-containing compounds, Neomycin-polymyxin b gu, Other, Azithromycin, Codeine, Darvon, Erythromycin, Meloxicam, Norco [hydrocodone-acetaminophen], Penicillins, Propoxyphene n-acetaminophen, Rofecoxib, Rosuvastatin, Statins, and Sulfa antibiotics   Social History:  The patient  reports that she quit smoking about 2 years ago. Her smoking use included cigarettes. She has a  31.00 pack-year smoking history. She has never used smokeless tobacco. She reports that she does not drink alcohol or use drugs.   Family History:  The patient's family history includes CAD in her father; Cancer in her sister; Colon cancer in her maternal grandmother; Deep vein thrombosis in her son; Diabetes in her sister; Heart disease in her father, mother, and sister; Hyperlipidemia in her father and another family member.  ROS:  Please see the history of present illness.  All other systems are reviewed and otherwise negative.   PHYSICAL EXAM:  VS:  BP 126/68   Pulse 70   Ht 5\' 5"  (1.651 m)   Wt 176 lb (79.8 kg)   BMI 29.29 kg/m  BMI: Body mass index is 29.29 kg/m. Well nourished, well developed, in no acute distress  HEENT: normocephalic, atraumatic  Neck: no JVD, carotid bruits or masses Cardiac: RRR; no significant murmurs, no rubs, or gallops Lungs:  CTA b/l, no wheezing, rhonchi or rales  Abd: soft, nontender MS: no deformity, age appropriate/perhaps advancedatrophy Ext:  no edema  Skin: warm and dry, no rash Neuro:  No gross deficits appreciated Psych: euthymic mood, full affect   ICD site is stable, no tethering or discomfort   EKG:  SR, V paced, no ECG evidence of severe hyperkalemia (her labs pending from today)  ICD interrogation done today and reviewed by myself: Battery and lead measurements are good 99.5%VP 99.3 effective No arrhythmias Optivol is at zero   08/24/2019: TTE IMPRESSIONS  1. Left ventricular ejection fraction, by  estimation, is 20 to 25%. The  left ventricle has severely decreased function. The left ventricle  demonstrates global hypokinesis. The left ventricular internal cavity size  was mildly dilated. Left ventricular  diastolic parameters are consistent with Grade II diastolic dysfunction  (pseudonormalization).  2. Right ventricular systolic function is normal. The right ventricular  size is normal. There is moderately elevated pulmonary artery systolic  pressure.  3. The mitral valve is normal in structure. Moderate mitral valve  regurgitation.  4. Tricuspid valve regurgitation is mild to moderate.  5. The aortic valve is normal in structure. Aortic valve regurgitation is  trivial. No aortic stenosis is present.   Comparison(s): 07/20/17 EF 35-40%.      Recent Labs: 10/22/2019: ALT 14; B Natriuretic Peptide 769.0; TSH 6.117 11/04/2019: Hemoglobin 15.8; Magnesium 2.7; Platelets 417 11/10/2019: BUN 63; Creatinine, Ser 1.93; Potassium 5.0; Sodium 138  No results found for requested labs within last 8760 hours.   Estimated Creatinine Clearance: 23.9 mL/min (A) (by C-G formula based on SCr of 1.93 mg/dL (H)).   Wt Readings from Last 3 Encounters:  11/10/19 176 lb (79.8 kg)  11/04/19 174 lb (78.9 kg)  11/02/19 175 lb 9.6 oz (79.7 kg)     Other studies reviewed: Additional studies/records reviewed today include: summarized above  ASSESSMENT AND PLAN:  1. ICD     Intact function, no programming changes made  2. VF     Not recurrent remains on amiodarone, will continue BID for now  3. CAD     No anginal symptoms  4. ICM 5. chronic CHF (combined)     No symptoms or exam findings of volume OL, OptiVol is all the  Way to zero     She is on   6. AKI/CKD 7. Hyperkalemia  They report she stopped the potassium after last call with Legrand Como.  They confirmed that she is taking Torsemide 60mg  BID She never stopped the spironolactine and is still  taking this.  Pending today's  labs She is instructed to stop the spironolactone, await the labs for further, EKG looks OK Instructed to monitor her weight,she tells me she does and has been stable, wobble a pound up/down Watch for edema, or any unusual SOB and if any to let us know.     Disposition: F/u with [place] in 2 weeks, sooner if needed.   ADDEND Labs came back after Epic was restored today K+ 5.0 and Creat though up some 1.93 but still improved from the start of this. She is definitely not volume OL currently I have asked my MA to call her with the following STOP spironolactone as we discussed, stay off potassium and reduce the torsemide to 40mg  BID BMET Monday    Current medicines are reviewed at length with the patient today.  The patient did not have any concerns regarding medicines.  Venetia Night, PA-C 11/10/2019 5:22 PM     [place] [address] [phone] (office)  [phone] (fax)

## 2019-11-09 NOTE — Progress Notes (Signed)
Remote ICD transmission.   

## 2019-11-10 ENCOUNTER — Ambulatory Visit (INDEPENDENT_AMBULATORY_CARE_PROVIDER_SITE_OTHER): Payer: Medicare Other | Admitting: Physician Assistant

## 2019-11-10 ENCOUNTER — Other Ambulatory Visit: Payer: Medicare Other

## 2019-11-10 ENCOUNTER — Other Ambulatory Visit: Payer: Self-pay

## 2019-11-10 VITALS — BP 126/68 | HR 70 | Ht 65.0 in | Wt 176.0 lb

## 2019-11-10 DIAGNOSIS — I10 Essential (primary) hypertension: Secondary | ICD-10-CM | POA: Diagnosis not present

## 2019-11-10 DIAGNOSIS — I4901 Ventricular fibrillation: Secondary | ICD-10-CM

## 2019-11-10 DIAGNOSIS — I255 Ischemic cardiomyopathy: Secondary | ICD-10-CM | POA: Diagnosis not present

## 2019-11-10 DIAGNOSIS — M5127 Other intervertebral disc displacement, lumbosacral region: Secondary | ICD-10-CM | POA: Diagnosis not present

## 2019-11-10 DIAGNOSIS — J449 Chronic obstructive pulmonary disease, unspecified: Secondary | ICD-10-CM | POA: Diagnosis not present

## 2019-11-10 DIAGNOSIS — I2510 Atherosclerotic heart disease of native coronary artery without angina pectoris: Secondary | ICD-10-CM | POA: Diagnosis not present

## 2019-11-10 DIAGNOSIS — N179 Acute kidney failure, unspecified: Secondary | ICD-10-CM

## 2019-11-10 DIAGNOSIS — E875 Hyperkalemia: Secondary | ICD-10-CM

## 2019-11-10 DIAGNOSIS — Z79899 Other long term (current) drug therapy: Secondary | ICD-10-CM | POA: Diagnosis not present

## 2019-11-10 DIAGNOSIS — J439 Emphysema, unspecified: Secondary | ICD-10-CM | POA: Diagnosis not present

## 2019-11-10 LAB — BASIC METABOLIC PANEL
BUN/Creatinine Ratio: 33 — ABNORMAL HIGH (ref 12–28)
BUN: 63 mg/dL — ABNORMAL HIGH (ref 8–27)
CO2: 28 mmol/L (ref 20–29)
Calcium: 10.3 mg/dL (ref 8.7–10.3)
Chloride: 98 mmol/L (ref 96–106)
Creatinine, Ser: 1.93 mg/dL — ABNORMAL HIGH (ref 0.57–1.00)
GFR calc Af Amer: 28 mL/min/{1.73_m2} — ABNORMAL LOW (ref >59)
GFR calc non Af Amer: 24 mL/min/{1.73_m2} — ABNORMAL LOW (ref >59)
Glucose: 188 mg/dL — ABNORMAL HIGH (ref 65–99)
Potassium: 5 mmol/L (ref 3.5–5.2)
Sodium: 138 mmol/L (ref 134–144)

## 2019-11-10 NOTE — Patient Instructions (Signed)
Medication Instructions:   STOP TAKING  POTASSIUM   STOP TAKING SPIRONOLACTONE   *If you need a refill on your cardiac medications before your next appointment, please call your pharmacy*   Lab Work: NONE ORDERED  TODAY   If you have labs (blood work) drawn today and your tests are completely normal, you will receive your results only by: Marland Kitchen MyChart Message (if you have MyChart) OR . A paper copy in the mail If you have any lab test that is abnormal or we need to change your treatment, we will call you to review the results.   Testing/Procedures: NONE ORDERED  TODAY   Follow-Up: At [facility], you and your health needs are our priority.  As part of our continuing mission to provide you with exceptional heart care, we have created designated Provider Care Teams.  These Care Teams include your primary Cardiologist (physician) and Advanced Practice Providers (APPs -  Physician Assistants and Nurse Practitioners) who all work together to provide you with the care you need, when you need it.  We recommend signing up for the patient portal called "MyChart".  Sign up information is provided on this After Visit Summary.  MyChart is used to connect with patients for Virtual Visits (Telemedicine).  Patients are able to view lab/test results, encounter notes, upcoming appointments, etc.  Non-urgent messages can be sent to your provider as well.   To learn more about what you can do with MyChart, go to [email or web address].    Your next appointment:   2 week(s)  The format for your next appointment:   In Person  Provider:    You may see   Legrand Como "Jonni Sanger" Chalmers Cater, PA-C  OR  DR C/APP     Other Instructions

## 2019-11-11 NOTE — Addendum Note (Signed)
Addended by: Claude Manges on: 11/11/2019 10:27 AM   Modules accepted: Orders

## 2019-11-15 ENCOUNTER — Telehealth: Payer: Self-pay

## 2019-11-15 DIAGNOSIS — I10 Essential (primary) hypertension: Secondary | ICD-10-CM

## 2019-11-15 DIAGNOSIS — Z79899 Other long term (current) drug therapy: Secondary | ICD-10-CM

## 2019-11-15 NOTE — Telephone Encounter (Signed)
-----   Message from Shirley Friar, PA-C sent at 11/11/2019  4:01 PM EDT ----- Looks like Renee stopped spironolactone and wanted repeat BMET next week (Tuesday ideally, as closed Monday).   This does not appear to have been arranged yet if we could please follow up on.  Thanks!  Legrand Como [address] [place], PA-C  11/11/2019 4:00 PM

## 2019-11-15 NOTE — Telephone Encounter (Signed)
lpmtcb 6/1

## 2019-11-15 NOTE — Telephone Encounter (Signed)
-----   Message from Shirley Friar, PA-C sent at 11/11/2019  4:01 PM EDT ----- Looks like Renee stopped spironolactone and wanted repeat BMET next week (Tuesday ideally, as closed Monday).   This does not appear to have been arranged yet if we could please follow up on.  Thanks!  Legrand Como [address] [place], PA-C  11/11/2019 4:00 PM

## 2019-11-15 NOTE — Telephone Encounter (Signed)
The patient has been notified of the lab result and verbalized understanding.  All questions (if any) were answered. Frederik Schmidt, RN 11/15/2019 11:06 AM

## 2019-11-16 ENCOUNTER — Other Ambulatory Visit: Payer: Medicare Other | Admitting: *Deleted

## 2019-11-16 ENCOUNTER — Other Ambulatory Visit: Payer: Self-pay

## 2019-11-16 DIAGNOSIS — I10 Essential (primary) hypertension: Secondary | ICD-10-CM | POA: Diagnosis not present

## 2019-11-16 DIAGNOSIS — Z79899 Other long term (current) drug therapy: Secondary | ICD-10-CM

## 2019-11-16 NOTE — Progress Notes (Signed)
Thank you :)

## 2019-11-17 ENCOUNTER — Other Ambulatory Visit: Payer: Self-pay

## 2019-11-17 ENCOUNTER — Telehealth: Payer: Self-pay

## 2019-11-17 DIAGNOSIS — M549 Dorsalgia, unspecified: Secondary | ICD-10-CM | POA: Diagnosis not present

## 2019-11-17 DIAGNOSIS — I272 Pulmonary hypertension, unspecified: Secondary | ICD-10-CM | POA: Diagnosis not present

## 2019-11-17 DIAGNOSIS — N183 Chronic kidney disease, stage 3 unspecified: Secondary | ICD-10-CM | POA: Diagnosis not present

## 2019-11-17 DIAGNOSIS — I110 Hypertensive heart disease with heart failure: Secondary | ICD-10-CM | POA: Diagnosis not present

## 2019-11-17 DIAGNOSIS — M1990 Unspecified osteoarthritis, unspecified site: Secondary | ICD-10-CM | POA: Diagnosis not present

## 2019-11-17 DIAGNOSIS — K219 Gastro-esophageal reflux disease without esophagitis: Secondary | ICD-10-CM | POA: Diagnosis not present

## 2019-11-17 DIAGNOSIS — I428 Other cardiomyopathies: Secondary | ICD-10-CM | POA: Diagnosis not present

## 2019-11-17 DIAGNOSIS — K5909 Other constipation: Secondary | ICD-10-CM | POA: Diagnosis not present

## 2019-11-17 DIAGNOSIS — G4700 Insomnia, unspecified: Secondary | ICD-10-CM | POA: Diagnosis not present

## 2019-11-17 DIAGNOSIS — G4733 Obstructive sleep apnea (adult) (pediatric): Secondary | ICD-10-CM | POA: Diagnosis not present

## 2019-11-17 DIAGNOSIS — I4901 Ventricular fibrillation: Secondary | ICD-10-CM | POA: Diagnosis not present

## 2019-11-17 DIAGNOSIS — E1151 Type 2 diabetes mellitus with diabetic peripheral angiopathy without gangrene: Secondary | ICD-10-CM | POA: Diagnosis not present

## 2019-11-17 DIAGNOSIS — I255 Ischemic cardiomyopathy: Secondary | ICD-10-CM | POA: Diagnosis not present

## 2019-11-17 DIAGNOSIS — I2510 Atherosclerotic heart disease of native coronary artery without angina pectoris: Secondary | ICD-10-CM | POA: Diagnosis not present

## 2019-11-17 DIAGNOSIS — E785 Hyperlipidemia, unspecified: Secondary | ICD-10-CM | POA: Diagnosis not present

## 2019-11-17 DIAGNOSIS — E1122 Type 2 diabetes mellitus with diabetic chronic kidney disease: Secondary | ICD-10-CM | POA: Diagnosis not present

## 2019-11-17 DIAGNOSIS — Z9981 Dependence on supplemental oxygen: Secondary | ICD-10-CM | POA: Diagnosis not present

## 2019-11-17 DIAGNOSIS — M797 Fibromyalgia: Secondary | ICD-10-CM | POA: Diagnosis not present

## 2019-11-17 DIAGNOSIS — G8929 Other chronic pain: Secondary | ICD-10-CM | POA: Diagnosis not present

## 2019-11-17 DIAGNOSIS — I5023 Acute on chronic systolic (congestive) heart failure: Secondary | ICD-10-CM | POA: Diagnosis not present

## 2019-11-17 DIAGNOSIS — J45909 Unspecified asthma, uncomplicated: Secondary | ICD-10-CM | POA: Diagnosis not present

## 2019-11-17 DIAGNOSIS — J439 Emphysema, unspecified: Secondary | ICD-10-CM | POA: Diagnosis not present

## 2019-11-17 LAB — BASIC METABOLIC PANEL
BUN/Creatinine Ratio: 28 (ref 12–28)
BUN: 43 mg/dL — ABNORMAL HIGH (ref 8–27)
CO2: 26 mmol/L (ref 20–29)
Calcium: 9.6 mg/dL (ref 8.7–10.3)
Chloride: 102 mmol/L (ref 96–106)
Creatinine, Ser: 1.52 mg/dL — ABNORMAL HIGH (ref 0.57–1.00)
GFR calc Af Amer: 37 mL/min/{1.73_m2} — ABNORMAL LOW (ref >59)
GFR calc non Af Amer: 32 mL/min/{1.73_m2} — ABNORMAL LOW (ref >59)
Glucose: 141 mg/dL — ABNORMAL HIGH (ref 65–99)
Potassium: 4.1 mmol/L (ref 3.5–5.2)
Sodium: 144 mmol/L (ref 134–144)

## 2019-11-17 MED ORDER — TORSEMIDE 20 MG PO TABS: 40.0000 mg | ORAL_TABLET | Freq: Two times a day (BID) | ORAL | 3 refills | Status: DC

## 2019-11-17 NOTE — Telephone Encounter (Signed)
-----   Message from Shirley Friar, PA-C sent at 11/17/2019 12:12 PM EDT ----- Creatinine continues to improve and K has normalized.  Can you please make sure she received the instructions to decrease torsemide to 40 mg BID from Renee?    She is currently off spironolactone and potassium supplementation. I'm not sure she was taking lasix and potassium as directed prior to her [facility] admission with marked AKI on what would've been dose equivalent torsemide.  I worry her K may now continue to trend down, but think we should just watch her for now and recheck at her two week follow up with me on 6/16. I will include Dr Sallyanne Kuster as an Juluis Rainier and to see if he has any other recommendations for the time being.  Thanks! Jonni Sanger

## 2019-11-17 NOTE — Telephone Encounter (Signed)
lpmtcb 6/3

## 2019-11-17 NOTE — Telephone Encounter (Signed)
I spoke to the patient with Andy's recommendation and she will monitor for swelling.  She verbalized understanding.

## 2019-11-17 NOTE — Telephone Encounter (Signed)
Thank you,  If she starts to have more swelling she can take an extra 20 mg of torsemide with 1 tablet (20 meq) of potassium and should call due to her labile labs.   Legrand Como [address] [place], [place]

## 2019-11-17 NOTE — Telephone Encounter (Signed)
Pt called back returning Michael's call. Call was transferred to Legrand Como

## 2019-11-17 NOTE — Telephone Encounter (Signed)
The patient has been notified of the lab result and verbalized understanding.  All questions (if any) were answered. Frederik Schmidt, RN 11/17/2019 12:39 PM    The patient will start the reduction of Torsemide 40 mg bid as recommended by Renee at last OV 5/27.  She never received message from [place].  She will monitor weight, BP/HR and other symptoms.

## 2019-11-19 ENCOUNTER — Other Ambulatory Visit: Payer: Self-pay | Admitting: Family Medicine

## 2019-11-21 DIAGNOSIS — E785 Hyperlipidemia, unspecified: Secondary | ICD-10-CM | POA: Diagnosis not present

## 2019-11-21 DIAGNOSIS — K5909 Other constipation: Secondary | ICD-10-CM | POA: Diagnosis not present

## 2019-11-21 DIAGNOSIS — G4733 Obstructive sleep apnea (adult) (pediatric): Secondary | ICD-10-CM | POA: Diagnosis not present

## 2019-11-21 DIAGNOSIS — K219 Gastro-esophageal reflux disease without esophagitis: Secondary | ICD-10-CM | POA: Diagnosis not present

## 2019-11-21 DIAGNOSIS — I2510 Atherosclerotic heart disease of native coronary artery without angina pectoris: Secondary | ICD-10-CM | POA: Diagnosis not present

## 2019-11-21 DIAGNOSIS — J439 Emphysema, unspecified: Secondary | ICD-10-CM | POA: Diagnosis not present

## 2019-11-21 DIAGNOSIS — G8929 Other chronic pain: Secondary | ICD-10-CM | POA: Diagnosis not present

## 2019-11-21 DIAGNOSIS — I272 Pulmonary hypertension, unspecified: Secondary | ICD-10-CM | POA: Diagnosis not present

## 2019-11-21 DIAGNOSIS — M1990 Unspecified osteoarthritis, unspecified site: Secondary | ICD-10-CM | POA: Diagnosis not present

## 2019-11-21 DIAGNOSIS — I5023 Acute on chronic systolic (congestive) heart failure: Secondary | ICD-10-CM | POA: Diagnosis not present

## 2019-11-21 DIAGNOSIS — I4901 Ventricular fibrillation: Secondary | ICD-10-CM | POA: Diagnosis not present

## 2019-11-21 DIAGNOSIS — M797 Fibromyalgia: Secondary | ICD-10-CM | POA: Diagnosis not present

## 2019-11-21 DIAGNOSIS — I428 Other cardiomyopathies: Secondary | ICD-10-CM | POA: Diagnosis not present

## 2019-11-21 DIAGNOSIS — Z9981 Dependence on supplemental oxygen: Secondary | ICD-10-CM | POA: Diagnosis not present

## 2019-11-21 DIAGNOSIS — J45909 Unspecified asthma, uncomplicated: Secondary | ICD-10-CM | POA: Diagnosis not present

## 2019-11-21 DIAGNOSIS — M549 Dorsalgia, unspecified: Secondary | ICD-10-CM | POA: Diagnosis not present

## 2019-11-21 DIAGNOSIS — E1122 Type 2 diabetes mellitus with diabetic chronic kidney disease: Secondary | ICD-10-CM | POA: Diagnosis not present

## 2019-11-21 DIAGNOSIS — I110 Hypertensive heart disease with heart failure: Secondary | ICD-10-CM | POA: Diagnosis not present

## 2019-11-21 DIAGNOSIS — I255 Ischemic cardiomyopathy: Secondary | ICD-10-CM | POA: Diagnosis not present

## 2019-11-21 DIAGNOSIS — E1151 Type 2 diabetes mellitus with diabetic peripheral angiopathy without gangrene: Secondary | ICD-10-CM | POA: Diagnosis not present

## 2019-11-21 DIAGNOSIS — G4700 Insomnia, unspecified: Secondary | ICD-10-CM | POA: Diagnosis not present

## 2019-11-21 DIAGNOSIS — N183 Chronic kidney disease, stage 3 unspecified: Secondary | ICD-10-CM | POA: Diagnosis not present

## 2019-11-21 NOTE — Telephone Encounter (Signed)
Last filled 05/11/2019 Last OV 03/10/2019  Ok to fill?

## 2019-11-23 ENCOUNTER — Telehealth: Payer: Self-pay | Admitting: Family Medicine

## 2019-11-23 ENCOUNTER — Telehealth: Payer: Self-pay

## 2019-11-23 NOTE — Telephone Encounter (Signed)
Carelink transmission received, normal device function, no alerts.  Spoke with pt, she sent transmission to make sure that her monitor was working after she accidentally knocked it off of the nightstand.  Advised transmission received, no concerns.

## 2019-11-23 NOTE — Telephone Encounter (Signed)
Sydney Phillips call and want a order home health therapy once a wk for the next 6 wks.she stated you can leave a message

## 2019-11-23 NOTE — Telephone Encounter (Signed)
Spoke with Mickel Baas, orders have been given. Nothing further needed.

## 2019-11-23 NOTE — Telephone Encounter (Signed)
Please okay these orders  ?

## 2019-11-24 DIAGNOSIS — M797 Fibromyalgia: Secondary | ICD-10-CM | POA: Diagnosis not present

## 2019-11-24 DIAGNOSIS — K219 Gastro-esophageal reflux disease without esophagitis: Secondary | ICD-10-CM | POA: Diagnosis not present

## 2019-11-24 DIAGNOSIS — E1122 Type 2 diabetes mellitus with diabetic chronic kidney disease: Secondary | ICD-10-CM | POA: Diagnosis not present

## 2019-11-24 DIAGNOSIS — I4901 Ventricular fibrillation: Secondary | ICD-10-CM | POA: Diagnosis not present

## 2019-11-24 DIAGNOSIS — I2510 Atherosclerotic heart disease of native coronary artery without angina pectoris: Secondary | ICD-10-CM | POA: Diagnosis not present

## 2019-11-24 DIAGNOSIS — I110 Hypertensive heart disease with heart failure: Secondary | ICD-10-CM | POA: Diagnosis not present

## 2019-11-24 DIAGNOSIS — I5023 Acute on chronic systolic (congestive) heart failure: Secondary | ICD-10-CM | POA: Diagnosis not present

## 2019-11-24 DIAGNOSIS — J439 Emphysema, unspecified: Secondary | ICD-10-CM | POA: Diagnosis not present

## 2019-11-24 DIAGNOSIS — I428 Other cardiomyopathies: Secondary | ICD-10-CM | POA: Diagnosis not present

## 2019-11-24 DIAGNOSIS — M549 Dorsalgia, unspecified: Secondary | ICD-10-CM | POA: Diagnosis not present

## 2019-11-24 DIAGNOSIS — K5909 Other constipation: Secondary | ICD-10-CM | POA: Diagnosis not present

## 2019-11-24 DIAGNOSIS — E1151 Type 2 diabetes mellitus with diabetic peripheral angiopathy without gangrene: Secondary | ICD-10-CM | POA: Diagnosis not present

## 2019-11-24 DIAGNOSIS — E785 Hyperlipidemia, unspecified: Secondary | ICD-10-CM | POA: Diagnosis not present

## 2019-11-24 DIAGNOSIS — G8929 Other chronic pain: Secondary | ICD-10-CM | POA: Diagnosis not present

## 2019-11-24 DIAGNOSIS — I272 Pulmonary hypertension, unspecified: Secondary | ICD-10-CM | POA: Diagnosis not present

## 2019-11-24 DIAGNOSIS — G4700 Insomnia, unspecified: Secondary | ICD-10-CM | POA: Diagnosis not present

## 2019-11-24 DIAGNOSIS — M1990 Unspecified osteoarthritis, unspecified site: Secondary | ICD-10-CM | POA: Diagnosis not present

## 2019-11-24 DIAGNOSIS — Z9981 Dependence on supplemental oxygen: Secondary | ICD-10-CM | POA: Diagnosis not present

## 2019-11-24 DIAGNOSIS — G4733 Obstructive sleep apnea (adult) (pediatric): Secondary | ICD-10-CM | POA: Diagnosis not present

## 2019-11-24 DIAGNOSIS — N183 Chronic kidney disease, stage 3 unspecified: Secondary | ICD-10-CM | POA: Diagnosis not present

## 2019-11-24 DIAGNOSIS — J45909 Unspecified asthma, uncomplicated: Secondary | ICD-10-CM | POA: Diagnosis not present

## 2019-11-24 DIAGNOSIS — I255 Ischemic cardiomyopathy: Secondary | ICD-10-CM | POA: Diagnosis not present

## 2019-11-26 DIAGNOSIS — I5021 Acute systolic (congestive) heart failure: Secondary | ICD-10-CM | POA: Diagnosis not present

## 2019-11-26 DIAGNOSIS — M5127 Other intervertebral disc displacement, lumbosacral region: Secondary | ICD-10-CM | POA: Diagnosis not present

## 2019-11-26 DIAGNOSIS — J439 Emphysema, unspecified: Secondary | ICD-10-CM | POA: Diagnosis not present

## 2019-11-26 DIAGNOSIS — I472 Ventricular tachycardia: Secondary | ICD-10-CM | POA: Diagnosis not present

## 2019-11-28 ENCOUNTER — Ambulatory Visit (INDEPENDENT_AMBULATORY_CARE_PROVIDER_SITE_OTHER): Payer: Medicare Other

## 2019-11-28 ENCOUNTER — Encounter: Payer: Medicare Other | Admitting: Student

## 2019-11-28 DIAGNOSIS — I5042 Chronic combined systolic (congestive) and diastolic (congestive) heart failure: Secondary | ICD-10-CM

## 2019-11-28 DIAGNOSIS — Z95810 Presence of automatic (implantable) cardiac defibrillator: Secondary | ICD-10-CM

## 2019-11-28 NOTE — Progress Notes (Deleted)
Electrophysiology Office Note Date: 11/28/2019  ID:  Sydney Phillips, DOB Dec 20, 1935, MRN [id number]  PCP: Laurey Morale, MD Primary Cardiologist: [name], MD Electrophysiologist: [name], MD   CC: Routine ICD follow-up  Sydney Phillips is a 84 y.o. female seen today for [name], MD for routine electrophysiology followup.  Since last being seen in our clinic the patient reports doing ***.   After initial post hospital visit had marked AKI but refused multiple times to go to ED so has been managed as an outpatient with frequent med adjustments and labs.    she denies chest pain, palpitations, dyspnea, PND, orthopnea, nausea, vomiting, dizziness, syncope, edema, weight gain, or early satiety. {He/she (caps):30048} has not had ICD shocks.   LHC 10/24/2019  Moderate three-vessel coronary disease.  30% ostial left main.  60% ostial LAD (unchanged from 2017) with moderate mid LAD plaque and calcification.  Patent proximal to mid circumflex with diffuse 50% in-stent restenosis. Ostial circumflex contains 30 to 40% narrowing.  RCA contains up to 50% diffuse in-stent restenosis. Proximal to the stented segment is an eccentric 70% region of narrowing  Elevated LVEDP, 31 mmHg suggesting decompensated acute on chronic combined systolic heart failure.  Catheterization from right radial was compromised by difficulty with catheter advancement, most likely due to selection of the radial recurrent route to the subclavian artery requiring that catheter size be downgraded to 4 [place].  Device History: Medtronic BiV ICD implanted 03/04/2017 for ICM  History of appropriate therapy: Yes History of AAD therapy: Yes; currently on amiodarone   Past Medical History:  Diagnosis Date  . Anxiety   . Arthritis   . Asthma   . Automatic implantable cardioverter-defibrillator in situ   . Bronchitis   . CAD (coronary artery disease)    a. s/p DES to LCx/RCA 05/2016,  ostial LAD disease.  . Cardiomyopathy ([place])   . Chronic back pain   . Chronic combined systolic and diastolic CHF (congestive heart failure) ([place])   . Chronic constipation   . Chronic pain   . CKD (chronic kidney disease), stage III   . Colon polyps 2003.  2015.   HP polyps 2003.  adnomas 2015.  required referal to [facility] for colonoscopic resection of flat polyps.   Marland Kitchen COPD (chronic obstructive pulmonary disease) ([place])   . Dementia ([place])   . Depression   . Depression with anxiety    takes Cymbalta daily  . Diabetes mellitus ([place])   . Dyspnea   . Early cataracts, bilateral   . Fibromyalgia   . GERD (gastroesophageal reflux disease)    was on meds but was taken off;now watches what she eats  . Hemorrhoids   . History of kidney stones   . Hx of colonic polyps   . Hyperlipemia    takes Crestor daily  . Hypertension    takes Amlodipine and Metoprolol daily  . Insomnia   . LBBB (left bundle branch block)    Stress test 09/03/2010, EF 55  . Myocardial infarction ([place])   . PAD (peripheral artery disease) ([facility])    Carotid, subclavian, and lower extremity beds, currently not symptomatic  . Presence of combination internal cardiac defibrillator (ICD) and pacemaker   . Presence of permanent cardiac pacemaker   . Pulmonary hypertension ([place])   . S/P angioplasty with stent, lt. subclavian 07/31/11 08/01/2011  . Subclavian arterial stenosis, lt, with PTA/STENT 07/31/11 08/01/2011  . Syncope 07/28/2011   EF - 50-55, moderate concentric  hypertrophy in left ventricle  . Urinary incontinence   . Vertigo    takes Meclizine prn   Past Surgical History:  Procedure Laterality Date  . ABDOMINAL AORTAGRAM N/A 08/12/2013   Procedure: ABDOMINAL Maxcine Ham;  Surgeon: Elam Dutch, MD;  Location: [facility] CATH LAB;  Service: Cardiovascular;  Laterality: N/A;  . ABDOMINAL HYSTERECTOMY    . APPENDECTOMY    . BACK SURGERY  2012  . BIV ICD GENERTAOR CHANGE OUT Left 02/20/2012   Procedure: BIV ICD GENERTAOR  CHANGE OUT;  Surgeon: [name], MD;  Location: [facility] CATH LAB;  Service: Cardiovascular;  Laterality: Left;  . CARDIAC CATHETERIZATION  12/01/2007   By Dr. Melvern Banker, left heart cath,   . CARDIAC CATHETERIZATION N/A 05/20/2016   Procedure: Right/Left Heart Cath and Coronary Angiography;  Surgeon: Sherren Mocha, MD;  Location: [place] CV LAB;  Service: Cardiovascular;  Laterality: N/A;  . CARDIAC CATHETERIZATION N/A 05/20/2016   Procedure: Coronary Stent Intervention;  Surgeon: Sherren Mocha, MD;  Location: [place] CV LAB;  Service: Cardiovascular;  Laterality: N/A;  . CARDIAC DEFIBRILLATOR PLACEMENT  05/2008   By Dr Blanch Media, Medtronic CANNOT HAVE MRI's  . CAROTID ANGIOGRAM N/A 07/31/2011   Procedure: CAROTID ANGIOGRAM;  Surgeon: Lorretta Harp, MD;  Location: [facility] CATH LAB;  Service: Cardiovascular;  Laterality: N/A;  carotid angiogram and possible Lt SCA PTA  . COLONOSCOPY W/ POLYPECTOMY  12/2013  . CORONARY ANGIOPLASTY    . ENDARTERECTOMY Left 11/08/2014   Procedure: LEFT CAROTID ENDARTERECTOMY WITH HEMASHIELD PATCH ANGIOPLASTY;  Surgeon: Elam Dutch, MD;  Location: [place];  Service: Vascular;  Laterality: Left;  . ESOPHAGOGASTRODUODENOSCOPY N/A 01/20/2014   Procedure: ESOPHAGOGASTRODUODENOSCOPY (EGD);  Surgeon: Jerene Bears, MD;  Location: [facility] ENDOSCOPY;  Service: Endoscopy;  Laterality: N/A;  . FEMORAL-POPLITEAL BYPASS GRAFT Right 10/12/2013   Procedure:   Femoral-Peroneal trunk  bypass with nonreversed greater saphenous vein graft;  Surgeon: Elam Dutch, MD;  Location: [place];  Service: Vascular;  Laterality: Right;  . GIVENS CAPSULE STUDY N/A 01/20/2014   Procedure: GIVENS CAPSULE STUDY;  Surgeon: Jerene Bears, MD;  Location: [place];  Service: Gastroenterology;  Laterality: N/A;  . ICD GENERATOR CHANGEOUT N/A 03/04/2017   Procedure: ICD Generator Changeout;  Surgeon: [name], MD;  Location: [place] CV LAB;  Service: Cardiovascular;  Laterality: N/A;  . INSERT /  REPLACE / REMOVE PACEMAKER    . INTRAOPERATIVE ARTERIOGRAM Right 10/12/2013   Procedure: INTRA OPERATIVE ARTERIOGRAM;  Surgeon: Elam Dutch, MD;  Location: [place];  Service: Vascular;  Laterality: Right;  . LEFT HEART CATH AND CORONARY ANGIOGRAPHY N/A 10/24/2019   Procedure: LEFT HEART CATH AND CORONARY ANGIOGRAPHY;  Surgeon: Belva Crome, MD;  Location: [place] CV LAB;  Service: Cardiovascular;  Laterality: N/A;  . ORIF ELBOW FRACTURE  08/16/2011   Procedure: OPEN REDUCTION INTERNAL FIXATION (ORIF) ELBOW/OLECRANON FRACTURE;  Surgeon: Schuyler Amor, MD;  Location: [place];  Service: Orthopedics;  Laterality: Left;  . RENAL ANGIOGRAM N/A 08/12/2013   Procedure: RENAL ANGIOGRAM;  Surgeon: Elam Dutch, MD;  Location[facility] CATH LAB;  Service: Cardiovascular;  Laterality: N/A;  . SUBCLAVIAN STENT PLACEMENT Left 07/31/2011   7x18 [place], balloon, with reduction of 90% ostial left subclavian artery stenosis to 0% with residual excellent flow  . TONSILLECTOMY    . TUBAL LIGATION      Current Outpatient Medications  Medication Sig Dispense Refill  . ACCU-CHEK FASTCLIX LANCETS MISC USE   TO CHECK GLUCOSE ONCE  DAILY 102 each 5  . ACCU-CHEK SMARTVIEW test strip USE  STRIP TO CHECK GLUCOSE ONCE DAILY 100 each 0  . albuterol (PROVENTIL) (2.5 MG/3ML) 0.083% nebulizer solution USE 1 VIAL IN NEBULIZER EVERY 4 HOURS AS NEEDED FOR WHEEZING FOR SHORTNESS OF BREATH 225 mL 0  . allopurinol (ZYLOPRIM) 300 MG tablet Take 1 tablet by mouth once daily 90 tablet 3  . ALPRAZolam (XANAX) 1 MG tablet TAKE 1 TABLET BY MOUTH IN THE MORNING AND 1 & 1/2 (ONE & ONE-HALF) NIGHTLY AS NEEDED FOR ANXIETY 75 tablet 5  . amiodarone (PACERONE) 200 MG tablet Take 1 tablet (200 mg total) by mouth 2 (two) times daily. 60 tablet 6  . aspirin 81 MG chewable tablet Chew 1 tablet (81 mg total) by mouth daily. 30 tablet 1  . azelastine (ASTELIN) 0.1 % nasal spray USE 2 SPRAY(S) IN EACH NOSTRIL TWICE DAILY AS DIRECTED 30 mL 0  .  bisoprolol (ZEBETA) 5 MG tablet Take 1 tablet (5 mg total) by mouth daily. 30 tablet 6  . cholecalciferol (VITAMIN D) 1000 units tablet Take 1,000 Units by mouth daily.    . clopidogrel (PLAVIX) 75 MG tablet Take 1 tablet by mouth once daily 90 tablet 3  . cyanocobalamin 1000 MCG tablet Take 1,000 mcg by mouth daily.    . DULoxetine (CYMBALTA) 60 MG capsule Take 1 capsule (60 mg total) by mouth 2 (two) times daily. 270 capsule 3  . Magnesium Oxide 400 MG CAPS Take 1 capsule (400 mg total) by mouth daily. 180 capsule 3  . nitroGLYCERIN (NITROSTAT) 0.4 MG SL tablet Place 1 tablet (0.4 mg total) under the tongue every 5 (five) minutes as needed for chest pain. X 3 doses 25 tablet 3  . PROAIR HFA 108 (90 Base) MCG/ACT inhaler INHALE 2 PUFFS BY MOUTH EVERY 4 HOURS AS NEEDED FOR WHEEZING AND FOR SHORTNESS OF BREATH 27 g 0  . torsemide (DEMADEX) 20 MG tablet Take 2 tablets (40 mg total) by mouth 2 (two) times daily. 180 tablet 3   No current facility-administered medications for this visit.    Allergies:   Potassium-containing compounds, Neomycin-polymyxin b gu, Other, Azithromycin, Codeine, Darvon, Erythromycin, Meloxicam, Norco [hydrocodone-acetaminophen], Penicillins, Propoxyphene n-acetaminophen, Rofecoxib, Rosuvastatin, Statins, and Sulfa antibiotics   Social History: Social History   Socioeconomic History  . Marital status: Widowed    Spouse name: Not on file  . Number of children: 3  . Years of education: 63  . Highest education level: Not on file  Occupational History  . Occupation: Retired  Tobacco Use  . Smoking status: Former Smoker    Packs/day: 0.50    Years: 62.00    Pack years: 31.00    Types: Cigarettes    Quit date: 07/19/2017    Years since quitting: 2.3  . Smokeless tobacco: Never Used  Vaping Use  . Vaping Use: Never used  Substance and Sexual Activity  . Alcohol use: No    Alcohol/week: 0.0 standard drinks  . Drug use: No  . Sexual activity: Never    Birth  control/protection: Surgical  Other Topics Concern  . Not on file  Social History Narrative   Ok to share information with medical POA, Son Gabriel Carina   Right-handed   Caffeine: Pepsi   Social Determinants of Health   Financial Resource Strain:   . Difficulty of Paying Living Expenses:   Food Insecurity:   . Worried About Charity fundraiser in the Last Year:   . Ran  Out of Food in the Last Year:   Transportation Needs:   . Lack of Transportation (Medical):   Marland Kitchen Lack of Transportation (Non-Medical):   Physical Activity:   . Days of Exercise per Week:   . Minutes of Exercise per Session:   Stress:   . Feeling of Stress :   Social Connections:   . Frequency of Communication with Friends and Family:   . Frequency of Social Gatherings with Friends and Family:   . Attends Religious Services:   . Active Member of Clubs or Organizations:   . Attends Archivist Meetings:   Marland Kitchen Marital Status:   Intimate Partner Violence:   . Fear of Current or Ex-Partner:   . Emotionally Abused:   Marland Kitchen Physically Abused:   . Sexually Abused:     Family History: Family History  Problem Relation Age of Onset  . CAD Father   . Heart disease Father   . Hyperlipidemia Father   . Heart disease Mother   . Deep vein thrombosis Son   . Hyperlipidemia Other   . Colon cancer Maternal Grandmother   . Cancer Sister        ovarian  . Diabetes Sister   . Heart disease Sister   . Anesthesia problems Neg Hx   . Hypotension Neg Hx   . Malignant hyperthermia Neg Hx   . Pseudochol deficiency Neg Hx     Review of Systems: All other systems reviewed and are otherwise negative except as noted above.   Physical Exam: There were no vitals filed for this visit.   GEN- The patient is well appearing, alert and oriented x 3 today.   HEENT: normocephalic, atraumatic; sclera clear, conjunctiva pink; hearing intact; oropharynx clear; neck supple, no JVP Lymph- no cervical lymphadenopathy Lungs- Clear  to ausculation bilaterally, normal work of breathing.  No wheezes, rales, rhonchi Heart- Regular rate and rhythm, no murmurs, rubs or gallops, PMI not laterally displaced GI- soft, non-tender, non-distended, bowel sounds present, no hepatosplenomegaly Extremities- no clubbing, cyanosis, or edema; DP/PT/radial pulses 2+ bilaterally MS- no significant deformity or atrophy Skin- warm and dry, no rash or lesion; ICD pocket well healed Psych- euthymic mood, full affect Neuro- strength and sensation are intact  ICD interrogation- reviewed in detail today,  See PACEART report  EKG:  EKG is not ordered today.  Recent Labs: 10/22/2019: ALT 14; B Natriuretic Peptide 769.0; TSH 6.117 11/04/2019: Hemoglobin 15.8; Magnesium 2.7; Platelets 417 11/16/2019: BUN 43; Creatinine, Ser 1.52; Potassium 4.1; Sodium 144   Wt Readings from Last 3 Encounters:  11/10/19 176 lb (79.8 kg)  11/04/19 174 lb (78.9 kg)  11/02/19 175 lb 9.6 oz (79.7 kg)     Other studies Reviewed: Additional studies/ records that were reviewed today include: Most recent labs, previous office notes, previous EP office notes.    Assessment and Plan:  1.  Chronic systolic dysfunction s/p Medtronic CRT-D  Echo 08/24/2019 LVEF 20-25% NYHA III Symptoms chronically, confounded by COPD Volume status *** on decreased torsemide.  Continue torsemide 40 mg BID Continue bisoprolol 5 mg daily Medical therapy has been limited by renal function and periods of hypotension.  Not candidate for entresto with above.  Normal ICD function See Pace Art report No changes today  2. VF *** since last visit Continue amiodarone 200 mg BID for now.  No culprit lesion on cath during admission 10/2019  3. CAD Moderate 3vD Denies ischemic symptoms.  Medical therapy and aggressive HF management recommended.  She is  not candidate for advanced therapies given age and severe COPD.   4. CKD III Baseline creatinine difficult. Previously 1.2 - 1.4 but very  labile recently  5. Tobacco abuse Severe COPD and continues to smoke at least 1/2 ppd As above, Age and COPD preclude her from advanced therapies.   Current medicines are reviewed at length with the patient today.   The patient does not have concerns regarding her medicines.  The following changes were made today:  none  Labs/ tests ordered today include: *** No orders of the defined types were placed in this encounter.    Disposition:   Follow up with *** {gen number [id number]} {TIME; UNITS DAY/WEEK/MONTH:19136}   Signed, Shirley Friar, PA-C  11/28/2019 9:45 AM  [facility] [address] [place] [address] [phone] (office) [phone] (fax)

## 2019-11-28 NOTE — Progress Notes (Signed)
EPIC Encounter for ICM Monitoring  Patient Name: Sydney Phillips is a 84 y.o. female Date: 11/28/2019 Primary Care Physican: Laurey Morale, MD Primary Cardiologist:Croitoru Electrophysiologist: Croitoru BiV Pacing: 99.5% 4/19/2021Weight:180.4lbs(baseline 171-176 lbs)    Patient being seen in the office today by Oda Kilts, PA.  OptivolThoracic impedancesuggesting possible fluid accumulation starting since 11/15/2019.  Prescribed:Furosemide 80 mg2 tablets(160 mg total)by mouthtwice a day(prescribed by Dr Sharlene Motts).Potassium 20 mEq 2tablets (40 mEq total)twice a day.  Labs: 03/09/2019 Creatinine 1.10, BUN 21, Potassium 4.3, Sodium 141, GFR 47.66 A complete set of results can be found in Results Review  Recommendations:Any recommendations will be given in the office today.  Follow-up plan: ICM clinic phone appointment on6/21/2021 to recheck fluid levels. 91 day device clinic remote transmission8/25/2021. Office visit with Oda Kilts, PA on 11/28/2019.  Copy of ICM check sent to Dr.Croitoru.  3 month ICM trend: 11/28/2019    1 Year ICM trend:       [name], RN 11/28/2019 10:05 AM

## 2019-11-29 ENCOUNTER — Telehealth: Payer: Self-pay

## 2019-11-29 NOTE — Progress Notes (Signed)
Attempted call to patient since it appears patient did not have an office visit on 11/28/2019 as scheduled and no answer.

## 2019-11-29 NOTE — Telephone Encounter (Signed)
Remote ICM transmission received.  Attempted call to patient regarding ICM remote transmission and no answer.  

## 2019-11-30 ENCOUNTER — Encounter: Payer: Medicare Other | Admitting: Student

## 2019-12-01 ENCOUNTER — Other Ambulatory Visit: Payer: Self-pay | Admitting: Family Medicine

## 2019-12-05 ENCOUNTER — Ambulatory Visit: Payer: Medicare Other | Admitting: Family Medicine

## 2019-12-05 ENCOUNTER — Other Ambulatory Visit: Payer: Self-pay

## 2019-12-05 ENCOUNTER — Encounter (HOSPITAL_COMMUNITY): Payer: Self-pay | Admitting: *Deleted

## 2019-12-05 ENCOUNTER — Emergency Department (HOSPITAL_COMMUNITY): Payer: Medicare Other

## 2019-12-05 ENCOUNTER — Telehealth: Payer: Self-pay | Admitting: Emergency Medicine

## 2019-12-05 ENCOUNTER — Inpatient Hospital Stay (HOSPITAL_COMMUNITY)
Admission: EM | Admit: 2019-12-05 | Discharge: 2019-12-12 | DRG: 291 | Disposition: A | Discharge status: Home Health | Payer: Medicare Other | Attending: Internal Medicine | Admitting: Internal Medicine

## 2019-12-05 DIAGNOSIS — I272 Pulmonary hypertension, unspecified: Secondary | ICD-10-CM | POA: Diagnosis present

## 2019-12-05 DIAGNOSIS — J439 Emphysema, unspecified: Secondary | ICD-10-CM | POA: Diagnosis not present

## 2019-12-05 DIAGNOSIS — M797 Fibromyalgia: Secondary | ICD-10-CM | POA: Diagnosis not present

## 2019-12-05 DIAGNOSIS — I739 Peripheral vascular disease, unspecified: Secondary | ICD-10-CM | POA: Diagnosis not present

## 2019-12-05 DIAGNOSIS — M1990 Unspecified osteoarthritis, unspecified site: Secondary | ICD-10-CM | POA: Diagnosis present

## 2019-12-05 DIAGNOSIS — K5909 Other constipation: Secondary | ICD-10-CM | POA: Diagnosis present

## 2019-12-05 DIAGNOSIS — J9621 Acute and chronic respiratory failure with hypoxia: Secondary | ICD-10-CM | POA: Diagnosis not present

## 2019-12-05 DIAGNOSIS — Z8719 Personal history of other diseases of the digestive system: Secondary | ICD-10-CM | POA: Diagnosis not present

## 2019-12-05 DIAGNOSIS — J90 Pleural effusion, not elsewhere classified: Secondary | ICD-10-CM | POA: Diagnosis not present

## 2019-12-05 DIAGNOSIS — I5043 Acute on chronic combined systolic (congestive) and diastolic (congestive) heart failure: Secondary | ICD-10-CM | POA: Diagnosis not present

## 2019-12-05 DIAGNOSIS — I255 Ischemic cardiomyopathy: Secondary | ICD-10-CM | POA: Diagnosis not present

## 2019-12-05 DIAGNOSIS — Z9981 Dependence on supplemental oxygen: Secondary | ICD-10-CM

## 2019-12-05 DIAGNOSIS — M5127 Other intervertebral disc displacement, lumbosacral region: Secondary | ICD-10-CM | POA: Diagnosis not present

## 2019-12-05 DIAGNOSIS — F17210 Nicotine dependence, cigarettes, uncomplicated: Secondary | ICD-10-CM | POA: Diagnosis present

## 2019-12-05 DIAGNOSIS — J9622 Acute and chronic respiratory failure with hypercapnia: Secondary | ICD-10-CM | POA: Diagnosis present

## 2019-12-05 DIAGNOSIS — I771 Stricture of artery: Secondary | ICD-10-CM | POA: Diagnosis present

## 2019-12-05 DIAGNOSIS — Z743 Need for continuous supervision: Secondary | ICD-10-CM | POA: Diagnosis not present

## 2019-12-05 DIAGNOSIS — R404 Transient alteration of awareness: Secondary | ICD-10-CM | POA: Diagnosis not present

## 2019-12-05 DIAGNOSIS — J9601 Acute respiratory failure with hypoxia: Secondary | ICD-10-CM

## 2019-12-05 DIAGNOSIS — Z95810 Presence of automatic (implantable) cardiac defibrillator: Secondary | ICD-10-CM

## 2019-12-05 DIAGNOSIS — Z882 Allergy status to sulfonamides status: Secondary | ICD-10-CM

## 2019-12-05 DIAGNOSIS — E785 Hyperlipidemia, unspecified: Secondary | ICD-10-CM | POA: Diagnosis not present

## 2019-12-05 DIAGNOSIS — G4733 Obstructive sleep apnea (adult) (pediatric): Secondary | ICD-10-CM | POA: Diagnosis present

## 2019-12-05 DIAGNOSIS — Z20822 Contact with and (suspected) exposure to covid-19: Secondary | ICD-10-CM | POA: Diagnosis not present

## 2019-12-05 DIAGNOSIS — F418 Other specified anxiety disorders: Secondary | ICD-10-CM | POA: Diagnosis present

## 2019-12-05 DIAGNOSIS — J449 Chronic obstructive pulmonary disease, unspecified: Secondary | ICD-10-CM | POA: Diagnosis not present

## 2019-12-05 DIAGNOSIS — I509 Heart failure, unspecified: Secondary | ICD-10-CM

## 2019-12-05 DIAGNOSIS — I447 Left bundle-branch block, unspecified: Secondary | ICD-10-CM | POA: Diagnosis present

## 2019-12-05 DIAGNOSIS — N1832 Chronic kidney disease, stage 3b: Secondary | ICD-10-CM | POA: Diagnosis present

## 2019-12-05 DIAGNOSIS — J810 Acute pulmonary edema: Secondary | ICD-10-CM | POA: Diagnosis not present

## 2019-12-05 DIAGNOSIS — Z83438 Family history of other disorder of lipoprotein metabolism and other lipidemia: Secondary | ICD-10-CM

## 2019-12-05 DIAGNOSIS — R0689 Other abnormalities of breathing: Secondary | ICD-10-CM | POA: Diagnosis not present

## 2019-12-05 DIAGNOSIS — I472 Ventricular tachycardia, unspecified: Secondary | ICD-10-CM

## 2019-12-05 DIAGNOSIS — F0390 Unspecified dementia without behavioral disturbance: Secondary | ICD-10-CM | POA: Diagnosis present

## 2019-12-05 DIAGNOSIS — Z7902 Long term (current) use of antithrombotics/antiplatelets: Secondary | ICD-10-CM

## 2019-12-05 DIAGNOSIS — Z90710 Acquired absence of both cervix and uterus: Secondary | ICD-10-CM

## 2019-12-05 DIAGNOSIS — Z888 Allergy status to other drugs, medicaments and biological substances status: Secondary | ICD-10-CM

## 2019-12-05 DIAGNOSIS — I110 Hypertensive heart disease with heart failure: Secondary | ICD-10-CM | POA: Diagnosis not present

## 2019-12-05 DIAGNOSIS — I130 Hypertensive heart and chronic kidney disease with heart failure and stage 1 through stage 4 chronic kidney disease, or unspecified chronic kidney disease: Secondary | ICD-10-CM | POA: Diagnosis not present

## 2019-12-05 DIAGNOSIS — I252 Old myocardial infarction: Secondary | ICD-10-CM

## 2019-12-05 DIAGNOSIS — Z9861 Coronary angioplasty status: Secondary | ICD-10-CM | POA: Diagnosis not present

## 2019-12-05 DIAGNOSIS — E1122 Type 2 diabetes mellitus with diabetic chronic kidney disease: Secondary | ICD-10-CM | POA: Diagnosis not present

## 2019-12-05 DIAGNOSIS — I083 Combined rheumatic disorders of mitral, aortic and tricuspid valves: Secondary | ICD-10-CM | POA: Diagnosis present

## 2019-12-05 DIAGNOSIS — G4700 Insomnia, unspecified: Secondary | ICD-10-CM | POA: Diagnosis present

## 2019-12-05 DIAGNOSIS — I10 Essential (primary) hypertension: Secondary | ICD-10-CM | POA: Diagnosis not present

## 2019-12-05 DIAGNOSIS — E1151 Type 2 diabetes mellitus with diabetic peripheral angiopathy without gangrene: Secondary | ICD-10-CM | POA: Diagnosis not present

## 2019-12-05 DIAGNOSIS — I2510 Atherosclerotic heart disease of native coronary artery without angina pectoris: Secondary | ICD-10-CM

## 2019-12-05 DIAGNOSIS — Z880 Allergy status to penicillin: Secondary | ICD-10-CM

## 2019-12-05 DIAGNOSIS — Z87442 Personal history of urinary calculi: Secondary | ICD-10-CM

## 2019-12-05 DIAGNOSIS — I517 Cardiomegaly: Secondary | ICD-10-CM | POA: Diagnosis not present

## 2019-12-05 DIAGNOSIS — Z79899 Other long term (current) drug therapy: Secondary | ICD-10-CM

## 2019-12-05 DIAGNOSIS — J811 Chronic pulmonary edema: Secondary | ICD-10-CM | POA: Diagnosis not present

## 2019-12-05 DIAGNOSIS — Z800 Family history of malignant neoplasm of digestive organs: Secondary | ICD-10-CM

## 2019-12-05 DIAGNOSIS — Z8249 Family history of ischemic heart disease and other diseases of the circulatory system: Secondary | ICD-10-CM

## 2019-12-05 DIAGNOSIS — K219 Gastro-esophageal reflux disease without esophagitis: Secondary | ICD-10-CM | POA: Diagnosis present

## 2019-12-05 DIAGNOSIS — Z885 Allergy status to narcotic agent status: Secondary | ICD-10-CM

## 2019-12-05 DIAGNOSIS — G8929 Other chronic pain: Secondary | ICD-10-CM | POA: Diagnosis not present

## 2019-12-05 DIAGNOSIS — N183 Chronic kidney disease, stage 3 unspecified: Secondary | ICD-10-CM | POA: Diagnosis present

## 2019-12-05 DIAGNOSIS — R0602 Shortness of breath: Secondary | ICD-10-CM | POA: Diagnosis not present

## 2019-12-05 DIAGNOSIS — Z833 Family history of diabetes mellitus: Secondary | ICD-10-CM

## 2019-12-05 DIAGNOSIS — R069 Unspecified abnormalities of breathing: Secondary | ICD-10-CM | POA: Diagnosis not present

## 2019-12-05 DIAGNOSIS — Z7982 Long term (current) use of aspirin: Secondary | ICD-10-CM

## 2019-12-05 DIAGNOSIS — Z881 Allergy status to other antibiotic agents status: Secondary | ICD-10-CM

## 2019-12-05 LAB — CBC WITH DIFFERENTIAL/PLATELET
Abs Immature Granulocytes: 0.05 10*3/uL (ref 0.00–0.07)
Basophils Absolute: 0 10*3/uL (ref 0.0–0.1)
Basophils Relative: 0 %
Eosinophils Absolute: 0.4 10*3/uL (ref 0.0–0.5)
Eosinophils Relative: 4 %
HCT: 42.1 % (ref 36.0–46.0)
Hemoglobin: 13 g/dL (ref 12.0–15.0)
Immature Granulocytes: 1 %
Lymphocytes Relative: 12 %
Lymphs Abs: 1.2 10*3/uL (ref 0.7–4.0)
MCH: 32.5 pg (ref 26.0–34.0)
MCHC: 30.9 g/dL (ref 30.0–36.0)
MCV: 105.3 fL — ABNORMAL HIGH (ref 80.0–100.0)
Monocytes Absolute: 1 10*3/uL (ref 0.1–1.0)
Monocytes Relative: 10 %
Neutro Abs: 7.4 10*3/uL (ref 1.7–7.7)
Neutrophils Relative %: 73 %
Platelets: 276 10*3/uL (ref 150–400)
RBC: 4 MIL/uL (ref 3.87–5.11)
RDW: 16.7 % — ABNORMAL HIGH (ref 11.5–15.5)
WBC: 10.1 10*3/uL (ref 4.0–10.5)
nRBC: 0 % (ref 0.0–0.2)

## 2019-12-05 LAB — I-STAT ARTERIAL BLOOD GAS, ED
Acid-Base Excess: 10 mmol/L — ABNORMAL HIGH (ref 0.0–2.0)
Bicarbonate: 36 mmol/L — ABNORMAL HIGH (ref 20.0–28.0)
Calcium, Ion: 1.16 mmol/L (ref 1.15–1.40)
HCT: 41 % (ref 36.0–46.0)
Hemoglobin: 13.9 g/dL (ref 12.0–15.0)
O2 Saturation: 97 %
Patient temperature: 98.2
Potassium: 4 mmol/L (ref 3.5–5.1)
Sodium: 143 mmol/L (ref 135–145)
TCO2: 38 mmol/L — ABNORMAL HIGH (ref 22–32)
pCO2 arterial: 53.9 mmHg — ABNORMAL HIGH (ref 32.0–48.0)
pH, Arterial: 7.432 (ref 7.350–7.450)
pO2, Arterial: 94 mmHg (ref 83.0–108.0)

## 2019-12-05 LAB — PROTIME-INR
INR: 1.1 (ref 0.8–1.2)
Prothrombin Time: 14 seconds (ref 11.4–15.2)

## 2019-12-05 LAB — SARS CORONAVIRUS 2 BY RT PCR (HOSPITAL ORDER, PERFORMED IN ~~LOC~~ HOSPITAL LAB): SARS Coronavirus 2: NEGATIVE

## 2019-12-05 LAB — COMPREHENSIVE METABOLIC PANEL
ALT: 14 U/L (ref 0–44)
AST: 19 U/L (ref 15–41)
Albumin: 3.6 g/dL (ref 3.5–5.0)
Alkaline Phosphatase: 97 U/L (ref 38–126)
Anion gap: 12 (ref 5–15)
BUN: 15 mg/dL (ref 8–23)
CO2: 29 mmol/L (ref 22–32)
Calcium: 9 mg/dL (ref 8.9–10.3)
Chloride: 102 mmol/L (ref 98–111)
Creatinine, Ser: 1.24 mg/dL — ABNORMAL HIGH (ref 0.44–1.00)
GFR calc Af Amer: 47 mL/min — ABNORMAL LOW (ref >60)
GFR calc non Af Amer: 41 mL/min — ABNORMAL LOW (ref >60)
Glucose, Bld: 152 mg/dL — ABNORMAL HIGH (ref 70–99)
Potassium: 4.6 mmol/L (ref 3.5–5.1)
Sodium: 143 mmol/L (ref 135–145)
Total Bilirubin: 0.7 mg/dL (ref 0.3–1.2)
Total Protein: 6.4 g/dL — ABNORMAL LOW (ref 6.5–8.1)

## 2019-12-05 LAB — MAGNESIUM: Magnesium: 1.9 mg/dL (ref 1.7–2.4)

## 2019-12-05 LAB — TROPONIN I (HIGH SENSITIVITY)
Troponin I (High Sensitivity): 22 ng/L — ABNORMAL HIGH (ref <18)
Troponin I (High Sensitivity): 20 ng/L — ABNORMAL HIGH (ref <18)

## 2019-12-05 LAB — LACTIC ACID, PLASMA
Lactic Acid, Venous: 1.8 mmol/L (ref 0.5–1.9)
Lactic Acid, Venous: 1.4 mmol/L (ref 0.5–1.9)

## 2019-12-05 LAB — PHOSPHORUS: Phosphorus: 3.1 mg/dL (ref 2.5–4.6)

## 2019-12-05 LAB — BRAIN NATRIURETIC PEPTIDE: B Natriuretic Peptide: 1385.6 pg/mL — ABNORMAL HIGH (ref 0.0–100.0)

## 2019-12-05 MED ORDER — ENOXAPARIN SODIUM 40 MG/0.4ML ~~LOC~~ SOLN
40.0000 mg | SUBCUTANEOUS | Status: DC
  Administered 2019-12-05 – 2019-12-08 (×4): 40 mg via SUBCUTANEOUS
  Filled 2019-12-05 (×4): qty 0.4

## 2019-12-05 MED ORDER — AZELASTINE HCL 0.1 % NA SOLN
2.0000 | Freq: Two times a day (BID) | NASAL | Status: DC
  Administered 2019-12-06 – 2019-12-12 (×10): 2 via NASAL
  Filled 2019-12-05: qty 30

## 2019-12-05 MED ORDER — CLOPIDOGREL BISULFATE 75 MG PO TABS
75.0000 mg | ORAL_TABLET | Freq: Every day | ORAL | Status: DC
  Administered 2019-12-07 – 2019-12-12 (×6): 75 mg via ORAL
  Filled 2019-12-05 (×7): qty 1

## 2019-12-05 MED ORDER — SODIUM CHLORIDE 0.9% FLUSH
3.0000 mL | INTRAVENOUS | Status: DC | PRN
  Administered 2019-12-05: 3 mL via INTRAVENOUS

## 2019-12-05 MED ORDER — FUROSEMIDE 10 MG/ML IJ SOLN
60.0000 mg | Freq: Once | INTRAMUSCULAR | Status: AC
  Administered 2019-12-05: 60 mg via INTRAVENOUS
  Filled 2019-12-05: qty 6

## 2019-12-05 MED ORDER — ALPRAZOLAM 0.5 MG PO TABS
1.5000 mg | ORAL_TABLET | Freq: Every evening | ORAL | Status: DC | PRN
  Administered 2019-12-06 – 2019-12-11 (×6): 1.5 mg via ORAL
  Filled 2019-12-05 (×6): qty 3

## 2019-12-05 MED ORDER — ASPIRIN 81 MG PO CHEW
81.0000 mg | CHEWABLE_TABLET | Freq: Every day | ORAL | Status: DC
  Administered 2019-12-07 – 2019-12-12 (×6): 81 mg via ORAL
  Filled 2019-12-05 (×7): qty 1

## 2019-12-05 MED ORDER — ALPRAZOLAM 0.5 MG PO TABS
1.0000 mg | ORAL_TABLET | Freq: Every day | ORAL | Status: DC
  Administered 2019-12-07 – 2019-12-12 (×6): 1 mg via ORAL
  Filled 2019-12-05 (×7): qty 2

## 2019-12-05 MED ORDER — MAGNESIUM OXIDE 400 (241.3 MG) MG PO TABS
400.0000 mg | ORAL_TABLET | Freq: Every day | ORAL | Status: DC
  Administered 2019-12-07 – 2019-12-12 (×6): 400 mg via ORAL
  Filled 2019-12-05 (×7): qty 1

## 2019-12-05 MED ORDER — AMIODARONE HCL 200 MG PO TABS
200.0000 mg | ORAL_TABLET | Freq: Two times a day (BID) | ORAL | Status: DC
  Administered 2019-12-06 – 2019-12-12 (×12): 200 mg via ORAL
  Filled 2019-12-05 (×13): qty 1

## 2019-12-05 MED ORDER — ALPRAZOLAM 0.5 MG PO TABS: 1.0000 mg | ORAL_TABLET | ORAL | Status: DC

## 2019-12-05 MED ORDER — ALLOPURINOL 300 MG PO TABS
300.0000 mg | ORAL_TABLET | Freq: Every day | ORAL | Status: DC
  Administered 2019-12-07 – 2019-12-12 (×6): 300 mg via ORAL
  Filled 2019-12-05 (×7): qty 1

## 2019-12-05 MED ORDER — SODIUM CHLORIDE 0.9% FLUSH
3.0000 mL | Freq: Two times a day (BID) | INTRAVENOUS | Status: DC
  Administered 2019-12-05 – 2019-12-12 (×14): 3 mL via INTRAVENOUS

## 2019-12-05 MED ORDER — IPRATROPIUM-ALBUTEROL 0.5-2.5 (3) MG/3ML IN SOLN
3.0000 mL | Freq: Four times a day (QID) | RESPIRATORY_TRACT | Status: DC
  Administered 2019-12-05 – 2019-12-06 (×5): 3 mL via RESPIRATORY_TRACT
  Filled 2019-12-05 (×6): qty 3

## 2019-12-05 MED ORDER — FUROSEMIDE 10 MG/ML IJ SOLN
80.0000 mg | Freq: Two times a day (BID) | INTRAMUSCULAR | Status: DC
  Administered 2019-12-05 – 2019-12-07 (×4): 80 mg via INTRAVENOUS
  Filled 2019-12-05 (×4): qty 8

## 2019-12-05 MED ORDER — ONDANSETRON HCL 4 MG/2ML IJ SOLN: 4.0000 mg | Freq: Four times a day (QID) | INTRAMUSCULAR | Status: DC | PRN

## 2019-12-05 MED ORDER — SODIUM CHLORIDE 0.9 % IV SOLN: 250.0000 mL | INTRAVENOUS | Status: DC | PRN

## 2019-12-05 MED ORDER — DULOXETINE HCL 60 MG PO CPEP
60.0000 mg | ORAL_CAPSULE | Freq: Two times a day (BID) | ORAL | Status: DC
  Administered 2019-12-06 – 2019-12-12 (×12): 60 mg via ORAL
  Filled 2019-12-05 (×13): qty 1

## 2019-12-05 NOTE — Consult Note (Signed)
Cardiology Consultation:   Patient ID: Sydney Phillips MRN: [id number]; DOB: Sep 27, 1935  Admit date: 12/05/2019 Date of Consult: 12/05/2019  Primary Care Provider: Laurey Morale, MD [facility] Cardiologist: [name], MD  [facility] Electrophysiologist:  [name], MD    Patient Profile:   Sydney Phillips is a 84 y.o. female with a hx of CHF, CAD, VT, CRT-D, COPD who is being seen today for the evaluation of CHF exacerbation with respiratory failure at the request of Dr. Tamala Julian.  History of Present Illness:   Sydney Phillips is an 84 year old woman with longstanding coronary disease, chronic systolic and diastolic heart failure, recurrent episodes of ventricular tachycardia/torsades de pointes requiring defibrillation, biventricular defibrillator, PAD, advanced chronic kidney disease, COPD, presenting with acute respiratory failure due to pulmonary edema.  Several changes have been made to her diuretic regimen following a hospitalization for ventricular fibrillation and defibrillator shocks in early May.  After switching from oral furosemide to oral torsemide she had a period of acute renal insufficiency and hyperkalemia.  Roughly 1 week ago her defibrillator showed evidence of volume overload by OptiVol.  The device clinic try to reach her unsuccessfully.  Today she developed worsening breathing and on arrival to the emergency room she was severely hypoxemic, oxygen saturation at 77%, tachypneic, but with normal hemodynamics.  She has improved after the administration of BiPAP and oxygen and diuretics.  She also received intravenous Solu-Medrol.  She remains rather groggy, but her son Sydney Phillips helps with the history and review of systems (the patient lives with Sydney Phillips's twin brother, Sydney Phillips).  As far as I can tell she did not have any complaints of angina and has not experienced syncope or recent defibrillator discharges.  She does not have lower extremity edema.  Volume  management has been increasingly difficult due to worsening renal function.  She continues to smoke cigarettes.  She has no intention of quitting.  Most recent echocardiogram shows estimated LVEF of 20-25% with global hypokinesis and grade 2 diastolic dysfunction, moderate mitral insufficiency.  Cardiac catheterization performed on Oct 24, 2019 showed stable findings (30% ostial left main, 60% ostial LAD, 30-40% ostial circumflex, 50% mid circumflex in-stent restenosis, 70% proximal RCA stenosis followed by approximately 50% diffuse in-stent restenosis.  Note difficulty performing cardiac catheterization via the right subclavian artery.  Medical management recommended.  She was hospitalized in early May for ventricular fibrillation and 4 defibrillator shocks after self-discontinuation of amiodarone, acute on chronic heart failure, in the setting of hypomagnesemia and hypokalemia.  Amiodarone was restarted.  Heart failure was treated with diuretics (switch to torsemide).  On 11/02/2019 she had evidence of acute on chronic renal failure with a creatinine of 2.66 and a potassium of 6.4.  The diuretics and potassium supplements were held.  On 11/10/2019 the creatinine was improved to 1.93 and the potassium 5.0.  Reportedly at that time she was taking torsemide 60 mg twice daily.  She was instructed to stop her spironolactone.  Today her creatinine is greatly improved to 1.24 and the potassium is normal at 4.6.  The BNP is markedly elevated at 1385.  The cardiac troponin is borderline high and in a plateau pattern.  Her ECG shows sinus rhythm (atrial sensed) ventricular paced rhythm with initial sharp R wave in lead V1 consistent with effective LV capture.  Most recent comprehensive download of her device was performed in person on May 19 and showed almost 99% biventricular pacing.  She has had highly volatile OptiVol readings for several  years now.   Past Medical History:  Diagnosis Date  . Anxiety   .  Arthritis   . Asthma   . Automatic implantable cardioverter-defibrillator in situ   . Bronchitis   . CAD (coronary artery disease)    a. s/p DES to LCx/RCA 05/2016, ostial LAD disease.  . Cardiomyopathy ([place])   . Chronic back pain   . Chronic combined systolic and diastolic CHF (congestive heart failure) ([place])   . Chronic constipation   . Chronic pain   . CKD (chronic kidney disease), stage III   . Colon polyps 2003.  2015.   HP polyps 2003.  adnomas 2015.  required referal to [facility] for colonoscopic resection of flat polyps.   Marland Kitchen COPD (chronic obstructive pulmonary disease) ([place])   . Dementia ([place])   . Depression   . Depression with anxiety    takes Cymbalta daily  . Diabetes mellitus ([place])   . Dyspnea   . Early cataracts, bilateral   . Fibromyalgia   . GERD (gastroesophageal reflux disease)    was on meds but was taken off;now watches what she eats  . Hemorrhoids   . History of kidney stones   . Hx of colonic polyps   . Hyperlipemia    takes Crestor daily  . Hypertension    takes Amlodipine and Metoprolol daily  . Insomnia   . LBBB (left bundle branch block)    Stress test 09/03/2010, EF 55  . Myocardial infarction ([place])   . PAD (peripheral artery disease) ([facility])    Carotid, subclavian, and lower extremity beds, currently not symptomatic  . Presence of combination internal cardiac defibrillator (ICD) and pacemaker   . Presence of permanent cardiac pacemaker   . Pulmonary hypertension ([place])   . S/P angioplasty with stent, lt. subclavian 07/31/11 08/01/2011  . Subclavian arterial stenosis, lt, with PTA/STENT 07/31/11 08/01/2011  . Syncope 07/28/2011   EF - 50-55, moderate concentric hypertrophy in left ventricle  . Urinary incontinence   . Vertigo    takes Meclizine prn    Past Surgical History:  Procedure Laterality Date  . ABDOMINAL AORTAGRAM N/A 08/12/2013   Procedure: ABDOMINAL Maxcine Ham;  Surgeon: Elam Dutch, MD;  Location: [facility] CATH LAB;  Service:  Cardiovascular;  Laterality: N/A;  . ABDOMINAL HYSTERECTOMY    . APPENDECTOMY    . BACK SURGERY  2012  . BIV ICD GENERTAOR CHANGE OUT Left 02/20/2012   Procedure: BIV ICD GENERTAOR CHANGE OUT;  Surgeon: [name], MD;  Location: [facility] CATH LAB;  Service: Cardiovascular;  Laterality: Left;  . CARDIAC CATHETERIZATION  12/01/2007   By Dr. Melvern Banker, left heart cath,   . CARDIAC CATHETERIZATION N/A 05/20/2016   Procedure: Right/Left Heart Cath and Coronary Angiography;  Surgeon: Sherren Mocha, MD;  Location: [place] CV LAB;  Service: Cardiovascular;  Laterality: N/A;  . CARDIAC CATHETERIZATION N/A 05/20/2016   Procedure: Coronary Stent Intervention;  Surgeon: Sherren Mocha, MD;  Location: [place] CV LAB;  Service: Cardiovascular;  Laterality: N/A;  . CARDIAC DEFIBRILLATOR PLACEMENT  05/2008   By Dr Blanch Media, [employer] CANNOT HAVE MRI's  . CAROTID ANGIOGRAM N/A 07/31/2011   Procedure: CAROTID ANGIOGRAM;  Surgeon: Lorretta Harp, MD;  Location: [facility] CATH LAB;  Service: Cardiovascular;  Laterality: N/A;  carotid angiogram and possible Lt SCA PTA  . COLONOSCOPY W/ POLYPECTOMY  12/2013  . CORONARY ANGIOPLASTY    . ENDARTERECTOMY Left 11/08/2014   Procedure: LEFT CAROTID ENDARTERECTOMY WITH HEMASHIELD PATCH ANGIOPLASTY;  Surgeon: Elam Dutch, MD;  Location: [facility] OR;  Service: Vascular;  Laterality: Left;  . ESOPHAGOGASTRODUODENOSCOPY N/A 01/20/2014   Procedure: ESOPHAGOGASTRODUODENOSCOPY (EGD);  Surgeon: Jerene Bears, MD;  Location: [facility] ENDOSCOPY;  Service: Endoscopy;  Laterality: N/A;  . FEMORAL-POPLITEAL BYPASS GRAFT Right 10/12/2013   Procedure:   Femoral-Peroneal trunk  bypass with nonreversed greater saphenous vein graft;  Surgeon: Elam Dutch, MD;  Location: [place];  Service: Vascular;  Laterality: Right;  . GIVENS CAPSULE STUDY N/A 01/20/2014   Procedure: GIVENS CAPSULE STUDY;  Surgeon: Jerene Bears, MD;  Location: [place];  Service: Gastroenterology;  Laterality: N/A;  . ICD GENERATOR  CHANGEOUT N/A 03/04/2017   Procedure: ICD Generator Changeout;  Surgeon: [name], MD;  Location: [place] CV LAB;  Service: Cardiovascular;  Laterality: N/A;  . INSERT / REPLACE / REMOVE PACEMAKER    . INTRAOPERATIVE ARTERIOGRAM Right 10/12/2013   Procedure: INTRA OPERATIVE ARTERIOGRAM;  Surgeon: Elam Dutch, MD;  Location: [place];  Service: Vascular;  Laterality: Right;  . LEFT HEART CATH AND CORONARY ANGIOGRAPHY N/A 10/24/2019   Procedure: LEFT HEART CATH AND CORONARY ANGIOGRAPHY;  Surgeon: Belva Crome, MD;  Location: [place] CV LAB;  Service: Cardiovascular;  Laterality: N/A;  . ORIF ELBOW FRACTURE  08/16/2011   Procedure: OPEN REDUCTION INTERNAL FIXATION (ORIF) ELBOW/OLECRANON FRACTURE;  Surgeon: Schuyler Amor, MD;  Location: [place];  Service: Orthopedics;  Laterality: Left;  . RENAL ANGIOGRAM N/A 08/12/2013   Procedure: RENAL ANGIOGRAM;  Surgeon: Elam Dutch, MD;  Location: [facility] CATH LAB;  Service: Cardiovascular;  Laterality: N/A;  . SUBCLAVIAN STENT PLACEMENT Left 07/31/2011   7x18 Genesis, balloon, with reduction of 90% ostial left subclavian artery stenosis to 0% with residual excellent flow  . TONSILLECTOMY    . TUBAL LIGATION       Home Medications:  Prior to Admission medications   Medication Sig Start Date End Date Taking? Authorizing Provider  ACCU-CHEK FASTCLIX LANCETS MISC USE   TO CHECK GLUCOSE ONCE DAILY Patient taking differently: 1 each by Other route daily.  07/27/18  Yes Laurey Morale, MD  ACCU-CHEK SMARTVIEW test strip USE  STRIP TO CHECK GLUCOSE ONCE DAILY Patient taking differently: 1 each by Other route daily.  06/15/19  Yes Laurey Morale, MD  albuterol (PROVENTIL) (2.5 MG/3ML) 0.083% nebulizer solution USE 1 VIAL IN NEBULIZER EVERY 4 HOURS AS NEEDED FOR WHEEZING FOR SHORTNESS OF BREATH Patient taking differently: Take 2.5 mg by nebulization every 4 (four) hours as needed for wheezing or shortness of breath.  05/18/19  Yes Laurey Morale, MD    allopurinol (ZYLOPRIM) 300 MG tablet Take 1 tablet by mouth once daily Patient taking differently: Take 300 mg by mouth daily.  11/22/19  Yes Laurey Morale, MD  ALPRAZolam Duanne Moron) 1 MG tablet TAKE 1 TABLET BY MOUTH IN THE MORNING AND 1 & 1/2 (ONE & ONE-HALF) NIGHTLY AS NEEDED FOR ANXIETY Patient taking differently: Take 1 mg by mouth See admin instructions. 1mg  by mouth qam and 1.5mg  by mough every night as needed for anxiety. 11/22/19  Yes Laurey Morale, MD  amiodarone (PACERONE) 200 MG tablet Take 1 tablet (200 mg total) by mouth 2 (two) times daily. 10/26/19  Yes Shirley Friar, PA-C  aspirin 81 MG chewable tablet Chew 1 tablet (81 mg total) by mouth daily. 05/23/16  Yes Reyne Dumas, MD  azelastine (ASTELIN) 0.1 % nasal spray USE 2 SPRAY(S) IN EACH NOSTRIL TWICE DAILY AS DIRECTED Patient taking differently: Place 2 sprays  into both nostrils 2 (two) times daily.  08/08/19  Yes Laurey Morale, MD  bisoprolol (ZEBETA) 10 MG tablet Take 10 mg by mouth in the morning and at bedtime.   Yes [provider]  cholecalciferol (VITAMIN D) 1000 units tablet Take 1,000 Units by mouth daily.   Yes [provider]  clopidogrel (PLAVIX) 75 MG tablet Take 1 tablet by mouth once daily 06/30/19  Yes Laurey Morale, MD  cyanocobalamin 1000 MCG tablet Take 1,000 mcg by mouth daily.   Yes [provider]  DULoxetine (CYMBALTA) 60 MG capsule Take 1 capsule (60 mg total) by mouth 2 (two) times daily. 01/05/19  Yes Laurey Morale, MD  loratadine (CLARITIN) 10 MG tablet Take 10 mg by mouth daily.   Yes [provider]  Magnesium Oxide 400 MG CAPS Take 1 capsule (400 mg total) by mouth daily. 07/15/19  Yes Leoda Smithhart, MD  nitroGLYCERIN (NITROSTAT) 0.4 MG SL tablet Place 1 tablet (0.4 mg total) under the tongue every 5 (five) minutes as needed for chest pain. X 3 doses Patient taking differently: Place 0.4 mg under the tongue every 5 (five) minutes as needed for chest pain.   11/02/19  Yes Shirley Friar, PA-C  PROAIR HFA 108 (90 Base) MCG/ACT inhaler INHALE 2 PUFFS BY MOUTH EVERY 4 HOURS AS NEEDED FOR WHEEZING AND FOR SHORTNESS OF BREATH Patient taking differently: Inhale 2 puffs into the lungs every 4 (four) hours as needed for wheezing or shortness of breath.  12/02/19  Yes Laurey Morale, MD  spironolactone (ALDACTONE) 25 MG tablet Take 12.5 mg by mouth daily.   Yes [provider]  torsemide (DEMADEX) 20 MG tablet Take 2 tablets (40 mg total) by mouth 2 (two) times daily. Patient taking differently: Take 60 mg by mouth 2 (two) times daily.  11/17/19 02/15/20 Yes Baldwin Jamaica, PA-C  bisoprolol (ZEBETA) 5 MG tablet Take 1 tablet (5 mg total) by mouth daily. Patient not taking: Reported on 12/05/2019 09/28/19   [name], MD    Inpatient Medications: Scheduled Meds: . enoxaparin (LOVENOX) injection  40 mg Subcutaneous Q24H  . furosemide  80 mg Intravenous BID  . ipratropium-albuterol  3 mL Nebulization QID  . sodium chloride flush  3 mL Intravenous Q12H   Continuous Infusions: . sodium chloride     PRN Meds: sodium chloride, ondansetron (ZOFRAN) IV, sodium chloride flush  Allergies:    Allergies  Allergen Reactions  . Potassium-Containing Compounds Other (See Comments)    Causes severe constipation  . Neomycin-Polymyxin B Gu     Swollen throat   . Other Other (See Comments)    "mycin" antibiotics cause sleeplessness and itching  . Azithromycin Rash  . Codeine Itching  . Darvon Itching  . Erythromycin Rash  . Meloxicam Other (See Comments)    Unknown    . Norco [Hydrocodone-Acetaminophen] Itching  . Penicillins Rash and Other (See Comments)    Has patient had a PCN reaction causing immediate rash, facial/tongue/throat swelling, SOB or lightheadedness with hypotension: unknown Has patient had a PCN reaction causing severe rash involving mucus membranes or skin necrosis: no Has patient had a PCN reaction that required  hospitalization: unknown Has patient had a PCN reaction occurring within the last 10 years: no If all of the above answers are "NO", then may proceed with Cephalosporin use.   Marland Kitchen Propoxyphene N-Acetaminophen Itching  . Rofecoxib Other (See Comments)    Unknown    . Rosuvastatin Other (  See Comments)    cramps  . Statins Itching and Other (See Comments)    Sleeplessness Sleeplessness  . Sulfa Antibiotics Rash    Social History:   Social History   Socioeconomic History  . Marital status: Widowed    Spouse name: Not on file  . Number of children: 3  . Years of education: 66  . Highest education level: Not on file  Occupational History  . Occupation: Retired  Tobacco Use  . Smoking status: Former Smoker    Packs/day: 0.50    Years: 62.00    Pack years: 31.00    Types: Cigarettes    Quit date: 07/19/2017    Years since quitting: 2.3  . Smokeless tobacco: Never Used  Vaping Use  . Vaping Use: Never used  Substance and Sexual Activity  . Alcohol use: No    Alcohol/week: 0.0 standard drinks  . Drug use: No  . Sexual activity: Never    Birth control/protection: Surgical  Other Topics Concern  . Not on file  Social History Narrative   Ok to share information with medical POA, Son Gabriel Carina   Right-handed   Caffeine: Pepsi   Social Determinants of Health   Financial Resource Strain:   . Difficulty of Paying Living Expenses:   Food Insecurity:   . Worried About Charity fundraiser in the Last Year:   . Arboriculturist in the Last Year:   Transportation Needs:   . Film/video editor (Medical):   Marland Kitchen Lack of Transportation (Non-Medical):   Physical Activity:   . Days of Exercise per Week:   . Minutes of Exercise per Session:   Stress:   . Feeling of Stress :   Social Connections:   . Frequency of Communication with Friends and Family:   . Frequency of Social Gatherings with Friends and Family:   . Attends Religious Services:   . Active Member of Clubs or  Organizations:   . Attends Archivist Meetings:   Marland Kitchen Marital Status:   Intimate Partner Violence:   . Fear of Current or Ex-Partner:   . Emotionally Abused:   Marland Kitchen Physically Abused:   . Sexually Abused:     Family History:    Family History  Problem Relation Age of Onset  . CAD Father   . Heart disease Father   . Hyperlipidemia Father   . Heart disease Mother   . Deep vein thrombosis Son   . Hyperlipidemia Other   . Colon cancer Maternal Grandmother   . Cancer Sister        ovarian  . Diabetes Sister   . Heart disease Sister   . Anesthesia problems Neg Hx   . Hypotension Neg Hx   . Malignant hyperthermia Neg Hx   . Pseudochol deficiency Neg Hx      ROS:  Please see the history of present illness.  All other ROS reviewed and negative.     Physical Exam/Data:   Vitals:   12/05/19 1415 12/05/19 1430 12/05/19 1445 12/05/19 1537  BP: (!) 144/85 (!) 109/51 135/63 (!) 163/72  Pulse: 65 63 64 65  Resp:  (!) 24 (!) 23 (!) 22  Temp:      TempSrc:      SpO2: 97% 93% 94% 95%  Weight:      Height:       No intake or output data in the 24 hours ending 12/05/19 1659 Last 3 Weights 12/05/2019 11/10/2019 11/04/2019  Weight (  lbs) 175 lb 14.8 oz 176 lb 174 lb  Weight (kg) 79.8 kg 79.833 kg 78.926 kg     Body mass index is 29.28 kg/m.  General:  Well nourished, well developed, in no acute distress.  Wearing BiPAP mask.  Sleepy.  Responds with nods of head. HEENT: normal Lymph: no adenopathy Neck: no JVD Endocrine:  No thryomegaly Vascular: No carotid bruits; FA pulses 2+ bilaterally without bruits  Cardiac:  normal S1, S2; RRR; no murmur .  Healthy left subclavian CRT-the site Lungs:  clear to auscultation bilaterally, no wheezing, rhonchi or rales  Abd: soft, nontender, no hepatomegaly  Ext: no edema Musculoskeletal:  No deformities, BUE and BLE strength normal and equal Skin: warm and dry  Neuro:  CNs 2-12 intact, no focal abnormalities noted Psych:  Normal affect    EKG:  The EKG was personally reviewed and demonstrates: Atrial sensed (sinus), ventricular paced rhythm with prominent sharp R waves in V1 consistent with effective LV capture Telemetry:  Telemetry was personally reviewed and demonstrates: Sinus rhythm (atrial sensed, ventricular paced)  Relevant CV Studies: Device in office check 11/02/2019 Left heart catheterization 10/24/2019  Moderate three-vessel coronary disease.  30% ostial left main.  60% ostial LAD (unchanged from 2017) with moderate mid LAD plaque and calcification.  Patent proximal to mid circumflex with diffuse 50% in-stent restenosis.  Ostial circumflex contains 30 to 40% narrowing.  RCA contains up to 50% diffuse in-stent restenosis.  Proximal to the stented segment is an eccentric 70% region of narrowing  Elevated LVEDP, 31 mmHg suggesting decompensated acute on chronic combined systolic heart failure.  Catheterization from right radial was compromised by difficulty with catheter advancement, most likely due to selection of the radial recurrent route to the subclavian artery requiring that catheter size be downgraded to 4 [place].  Recommendations:   No high-grade thrombotic appearing lesions noted.  Three-vessel disease as noted above.  Coronary angiography/PCI attempt in the future should not be via the right radial approach.  Guideline directed therapy for acute on chronic combined systolic and diastolic heart failure.  Consider advanced heart failure team consult.  Echocardiogram 08/24/2019 1. Left ventricular ejection fraction, by estimation, is 20 to 25%. The  left ventricle has severely decreased function. The left ventricle  demonstrates global hypokinesis. The left ventricular internal cavity size  was mildly dilated. Left ventricular  diastolic parameters are consistent with Grade II diastolic dysfunction  (pseudonormalization).  2. Right ventricular systolic function is normal. The right ventricular    size is normal. There is moderately elevated pulmonary artery systolic  pressure.  3. The mitral valve is normal in structure. Moderate mitral valve  regurgitation.  4. Tricuspid valve regurgitation is mild to moderate.  5. The aortic valve is normal in structure. Aortic valve regurgitation is  trivial. No aortic stenosis is present.   Comparison(s): 07/20/17 EF 35-40%.   Laboratory Data:  High Sensitivity Troponin:   Recent Labs  Lab 12/05/19 1259 12/05/19 1545  TROPONINIHS 22* 20*     Chemistry Recent Labs  Lab 12/05/19 1259 12/05/19 1343  NA 143 143  K 4.6 4.0  CL 102  --   CO2 29  --   GLUCOSE 152*  --   BUN 15  --   CREATININE 1.24*  --   CALCIUM 9.0  --   GFRNONAA 41*  --   GFRAA 47*  --   ANIONGAP 12  --     Recent Labs  Lab 12/05/19 1259  PROT 6.4*  ALBUMIN 3.6  AST 19  ALT 14  ALKPHOS 97  BILITOT 0.7   Hematology Recent Labs  Lab 12/05/19 1259 12/05/19 1343  WBC 10.1  --   RBC 4.00  --   HGB 13.0 13.9  HCT 42.1 41.0  MCV 105.3*  --   MCH 32.5  --   MCHC 30.9  --   RDW 16.7*  --   PLT 276  --    BNP Recent Labs  Lab 12/05/19 1259  BNP 1,385.6*    DDimer No results for input(s): DDIMER in the last 168 hours.   Radiology/Studies:  DG Chest Port 1 View  Result Date: 12/05/2019 CLINICAL DATA:  84 year old female with history of severe shortness of breath. Respiratory rest. EXAM: PORTABLE CHEST 1 VIEW COMPARISON:  Chest x-ray 10/22/2019. FINDINGS: There is cephalization of the pulmonary vasculature and slight indistinctness of the interstitial markings suggestive of mild pulmonary edema. Small left pleural effusion. Mild cardiomegaly. The patient is rotated to the right on today's exam, resulting in distortion of the mediastinal contours and reduced diagnostic sensitivity and specificity for mediastinal pathology. Left-sided biventricular pacemaker device in place with lead tips projecting over the expected location of the right atrium,  right ventricle and left ventricle via the coronary sinus and coronary veins. IMPRESSION: 1. Appearance the chest suggests mild congestive heart failure, as above. 2. Aortic atherosclerosis. Electronically Signed   By: Vinnie Langton M.D.   On: 12/05/2019 13:57   {  Assessment and Plan:   1. Acute respiratory failure with hypoxemia/pulmonary edema: Improving with diuretics and BiPAP.  She is due to receive another dose of intravenous diuretic at 1800 hrs. 2. Acute on chronic combined systolic and diastolic heart failure: Multiple changes have been made to her diuretic regimen over the last month.  A week ago she was already showing signs of fluid overload by OptiVol, but we have been unable to reach her by phone.  Intravenous diuretics are being administered.  We need to reestablish her "dry weight.  Management of heart failure has become more complicated with worsening underlying renal function.  She has been intolerant of RAAS inhibitors in the past due to orthostatic syncope/hypotension with falls.  With the volatility that her renal function has shown recently, I do not know that this is a good time to try again.  Spironolactone has been discontinued. Restart bisoprolol once out of CHF exacerbation. BP running high and unable to take PO right now. Will add topical NTG. 3. CAD: No evidence of acute coronary syndrome, recent cardiac catheterization did not show any high risk/severe stenoses.  Medical management is recommended.  On statin, aspirin/clopidogrel, beta-blocker. 4. VT: She has had multiple appropriate defibrillator discharges since device implantation, of which she has not always been aware.  Currently on amiodarone without recent VT.  We will need to review at one point we should decrease her dose of amiodarone.  Will request Dr. Macky Lower opinion. 5. CRT-D: Normal device function with 98% biventricular pacing and evidence of effective LV capture by ECG and device check.  Continue remote  downloads. 6. COPD: Lowers her threshold for respiratory decompensation.  Unfortunately she continues to smoke (Quit 2019, restarted?). On bisoprolol for its highly selective beta 1 properties. 7. PAD: check BP right arm. Hx L subclavian stent. Has abnormal ABI, asymptomatic (sedentary). Previous L carotid endarterectomy. 8. Dyslipidemia: have tried numerous statins, stopped due to side effects. She declined PCSK9 inh.     CRITICAL CARE Performed by: Dani Gobble Halcyon Heck  Total critical care time: 48 minutes  Critical care time was exclusive of separately billable procedures and treating other patients.  Critical care was necessary to treat or prevent imminent or life-threatening deterioration.  Critical care was time spent personally by me on the following activities: development of treatment plan with patient and/or surrogate as well as nursing, discussions with consultants, evaluation of patient's response to treatment, examination of patient, obtaining history from patient or surrogate, ordering and performing treatments and interventions, ordering and review of laboratory studies, ordering and review of radiographic studies, pulse oximetry and re-evaluation of patient's condition.  For questions or updates, please contact [place] Please consult [email or web address] for contact info under    Signed, [name], MD  12/05/2019 4:59 PM

## 2019-12-05 NOTE — ED Triage Notes (Signed)
[facility] called to pt home for resp distress.  Upon their arrival pt was gray with sats of 77%, rr of 34, hr of 72 and bp of 158/86.  Pt placed on C-pap, given 125 solumedrol and 2 duo-nebx with improvement to 94% oxygenation.

## 2019-12-05 NOTE — H&P (Signed)
History and Physical    Sydney Phillips IHW:[id number] DOB: 02-09-1936 DOA: 12/05/2019  Referring MD/NP/PA: Ezzie Dural, MD PCP: Laurey Morale, MD  Consultants:Primary Cardiologist: [name], MD Electrophysiologist: [name], MD  Patient coming from: Home via EMS  Chief Complaint: Shortness of breath  I have personally briefly reviewed patient's old medical records in [place]   HPI: Sydney Phillips is a 84 y.o. female with medical history significant of CAD s/p PCI, ischemic CM, PAD, HFrEF (20-25%), CKD II, severe COPD on 2 L of oxygen, HTN, DM2, prior VT/VF s/p ICD, PVD, OSA, HLD, tobacco abuse, and PAH who presents with complaints of shortness of breath which started yesterday.  History obtained from the patient's son is present at bedside as patient currently on BiPAP and lethargic.  He reports that the patient fell two nights ago, but said that she did not hurt her self at that time.  However, yesterday patient was noted to be more pale in color and breathing harder than normal.  Her baseline weight is supposed to be around 179 pounds, but her weight recently was up to 188.  He reports that she has been taking torsemide 60 mg twice daily as prescribed and took an additional dose yesterday due to her increase in weight.  Last night she she said that she is going to sleep, but son is unsure that she ever did.  She was asking for something to drink this morning and when her son gave it to her she got choked up and vomited the rest of it up.  Upon EMS arrival patient was noted to have O2 saturations of 77% on her home 2 L of oxygen.  Patient's son notes that she sometimes has a hard time keeping the oxygen in her nose.  Patient was placed on CPAP, given 125 mg of Solu-Medrol, and 2 DuoNeb's prior to arrival with improvement of O2 saturations to 94%.  Patient OptiVol has been noted to show signs of volume overload about 1 week ago.  ED Course: On admission into the  emergency department patient was noted to be afebrile with respirations 27, and O2 saturation maintained on BiPAP.  ABG revealed pH 7.432, PCO2 53.9, and PO2 94.  Labs revealed WBC 10.1, BUN 13, creatinine 1.24, troponin 22, lactic acid 1.8, and BNP was pending.  Chest x-ray was suggestive of mild CHF.  Patient was given 60 mg of furosemide IV.  TRH called to admit  Review of Systems  Constitutional: Positive for malaise/fatigue. Negative for fever.       Positive for weight gain  Eyes: Negative for photophobia and pain.  Respiratory: Positive for cough.   Cardiovascular: Negative for chest pain.  Gastrointestinal: Negative for vomiting.  Genitourinary: Negative for dysuria and hematuria.  All other systems reviewed and are negative.   Past Medical History:  Diagnosis Date  . Anxiety   . Arthritis   . Asthma   . Automatic implantable cardioverter-defibrillator in situ   . Bronchitis   . CAD (coronary artery disease)    a. s/p DES to LCx/RCA 05/2016, ostial LAD disease.  . Cardiomyopathy ([place])   . Chronic back pain   . Chronic combined systolic and diastolic CHF (congestive heart failure) ([place])   . Chronic constipation   . Chronic pain   . CKD (chronic kidney disease), stage III   . Colon polyps 2003.  2015.   HP polyps 2003.  adnomas 2015.  required referal to [facility] for colonoscopic  resection of flat polyps.   Marland Kitchen COPD (chronic obstructive pulmonary disease) ([place])   . Dementia ([place])   . Depression   . Depression with anxiety    takes Cymbalta daily  . Diabetes mellitus ([place])   . Dyspnea   . Early cataracts, bilateral   . Fibromyalgia   . GERD (gastroesophageal reflux disease)    was on meds but was taken off;now watches what she eats  . Hemorrhoids   . History of kidney stones   . Hx of colonic polyps   . Hyperlipemia    takes Crestor daily  . Hypertension    takes Amlodipine and Metoprolol daily  . Insomnia   . LBBB (left bundle branch block)    Stress test  09/03/2010, EF 55  . Myocardial infarction ([place])   . PAD (peripheral artery disease) ([facility])    Carotid, subclavian, and lower extremity beds, currently not symptomatic  . Presence of combination internal cardiac defibrillator (ICD) and pacemaker   . Presence of permanent cardiac pacemaker   . Pulmonary hypertension ([place])   . S/P angioplasty with stent, lt. subclavian 07/31/11 08/01/2011  . Subclavian arterial stenosis, lt, with PTA/STENT 07/31/11 08/01/2011  . Syncope 07/28/2011   EF - 50-55, moderate concentric hypertrophy in left ventricle  . Urinary incontinence   . Vertigo    takes Meclizine prn    Past Surgical History:  Procedure Laterality Date  . ABDOMINAL AORTAGRAM N/A 08/12/2013   Procedure: ABDOMINAL Maxcine Ham;  Surgeon: Elam Dutch, MD;  Location: [facility] CATH LAB;  Service: Cardiovascular;  Laterality: N/A;  . ABDOMINAL HYSTERECTOMY    . APPENDECTOMY    . BACK SURGERY  2012  . BIV ICD GENERTAOR CHANGE OUT Left 02/20/2012   Procedure: BIV ICD GENERTAOR CHANGE OUT;  Surgeon: [name], MD;  Location: [facility] CATH LAB;  Service: Cardiovascular;  Laterality: Left;  . CARDIAC CATHETERIZATION  12/01/2007   By Dr. Melvern Banker, left heart cath,   . CARDIAC CATHETERIZATION N/A 05/20/2016   Procedure: Right/Left Heart Cath and Coronary Angiography;  Surgeon: Sherren Mocha, MD;  Location: [place] CV LAB;  Service: Cardiovascular;  Laterality: N/A;  . CARDIAC CATHETERIZATION N/A 05/20/2016   Procedure: Coronary Stent Intervention;  Surgeon: Sherren Mocha, MD;  Location: [place] CV LAB;  Service: Cardiovascular;  Laterality: N/A;  . CARDIAC DEFIBRILLATOR PLACEMENT  05/2008   By Dr Blanch Media, [employer] CANNOT HAVE MRI's  . CAROTID ANGIOGRAM N/A 07/31/2011   Procedure: CAROTID ANGIOGRAM;  Surgeon: Lorretta Harp, MD;  Location: [facility] CATH LAB;  Service: Cardiovascular;  Laterality: N/A;  carotid angiogram and possible Lt SCA PTA  . COLONOSCOPY W/ POLYPECTOMY  12/2013  . CORONARY ANGIOPLASTY      . ENDARTERECTOMY Left 11/08/2014   Procedure: LEFT CAROTID ENDARTERECTOMY WITH HEMASHIELD PATCH ANGIOPLASTY;  Surgeon: Elam Dutch, MD;  Location: [place];  Service: Vascular;  Laterality: Left;  . ESOPHAGOGASTRODUODENOSCOPY N/A 01/20/2014   Procedure: ESOPHAGOGASTRODUODENOSCOPY (EGD);  Surgeon: Jerene Bears, MD;  Location: [facility] ENDOSCOPY;  Service: Endoscopy;  Laterality: N/A;  . FEMORAL-POPLITEAL BYPASS GRAFT Right 10/12/2013   Procedure:   Femoral-Peroneal trunk  bypass with nonreversed greater saphenous vein graft;  Surgeon: Elam Dutch, MD;  Location: [place];  Service: Vascular;  Laterality: Right;  . GIVENS CAPSULE STUDY N/A 01/20/2014   Procedure: GIVENS CAPSULE STUDY;  Surgeon: Jerene Bears, MD;  Location: [place];  Service: Gastroenterology;  Laterality: N/A;  . ICD GENERATOR CHANGEOUT N/A 03/04/2017   Procedure: ICD Generator Changeout;  Surgeon: [name], MD;  Location: [place] CV LAB;  Service: Cardiovascular;  Laterality: N/A;  . INSERT / REPLACE / REMOVE PACEMAKER    . INTRAOPERATIVE ARTERIOGRAM Right 10/12/2013   Procedure: INTRA OPERATIVE ARTERIOGRAM;  Surgeon: Elam Dutch, MD;  Location: [place];  Service: Vascular;  Laterality: Right;  . LEFT HEART CATH AND CORONARY ANGIOGRAPHY N/A 10/24/2019   Procedure: LEFT HEART CATH AND CORONARY ANGIOGRAPHY;  Surgeon: Belva Crome, MD;  Location: [place] CV LAB;  Service: Cardiovascular;  Laterality: N/A;  . ORIF ELBOW FRACTURE  08/16/2011   Procedure: OPEN REDUCTION INTERNAL FIXATION (ORIF) ELBOW/OLECRANON FRACTURE;  Surgeon: Schuyler Amor, MD;  Location: [place];  Service: Orthopedics;  Laterality: Left;  . RENAL ANGIOGRAM N/A 08/12/2013   Procedure: RENAL ANGIOGRAM;  Surgeon: Elam Dutch, MD;  Location: [facility] CATH LAB;  Service: Cardiovascular;  Laterality: N/A;  . SUBCLAVIAN STENT PLACEMENT Left 07/31/2011   7x18 [employer], balloon, with reduction of 90% ostial left subclavian artery stenosis to 0% with residual  excellent flow  . TONSILLECTOMY    . TUBAL LIGATION       reports that she quit smoking about 2 years ago. Her smoking use included cigarettes. She has a 31.00 pack-year smoking history. She has never used smokeless tobacco. She reports that she does not drink alcohol and does not use drugs.  Allergies  Allergen Reactions  . Potassium-Containing Compounds Other (See Comments)    Causes severe constipation  . Neomycin-Polymyxin B Gu     Swollen throat   . Other Other (See Comments)    "mycin" antibiotics cause sleeplessness and itching  . Azithromycin Rash  . Codeine Itching  . Darvon Itching  . Erythromycin Rash  . Meloxicam Other (See Comments)    Unknown    . Norco [Hydrocodone-Acetaminophen] Itching  . Penicillins Rash and Other (See Comments)    Has patient had a PCN reaction causing immediate rash, facial/tongue/throat swelling, SOB or lightheadedness with hypotension: unknown Has patient had a PCN reaction causing severe rash involving mucus membranes or skin necrosis: no Has patient had a PCN reaction that required hospitalization: unknown Has patient had a PCN reaction occurring within the last 10 years: no If all of the above answers are "NO", then may proceed with Cephalosporin use.   Marland Kitchen Propoxyphene N-Acetaminophen Itching  . Rofecoxib Other (See Comments)    Unknown    . Rosuvastatin Other (See Comments)    cramps  . Statins Itching and Other (See Comments)    Sleeplessness Sleeplessness  . Sulfa Antibiotics Rash    Family History  Problem Relation Age of Onset  . CAD Father   . Heart disease Father   . Hyperlipidemia Father   . Heart disease Mother   . Deep vein thrombosis Son   . Hyperlipidemia Other   . Colon cancer Maternal Grandmother   . Cancer Sister        ovarian  . Diabetes Sister   . Heart disease Sister   . Anesthesia problems Neg Hx   . Hypotension Neg Hx   . Malignant hyperthermia Neg Hx   . Pseudochol deficiency Neg Hx     Prior  to Admission medications   Medication Sig Start Date End Date Taking? Authorizing Provider  ACCU-CHEK FASTCLIX LANCETS MISC USE   TO CHECK GLUCOSE ONCE DAILY Patient taking differently: 1 each by Other route daily.  07/27/18   Laurey Morale, MD  ACCU-CHEK SMARTVIEW test strip USE  STRIP TO  CHECK GLUCOSE ONCE DAILY Patient taking differently: 1 each by Other route daily.  06/15/19   Laurey Morale, MD  albuterol (PROVENTIL) (2.5 MG/3ML) 0.083% nebulizer solution USE 1 VIAL IN NEBULIZER EVERY 4 HOURS AS NEEDED FOR WHEEZING FOR SHORTNESS OF BREATH Patient taking differently: Take 2.5 mg by nebulization every 4 (four) hours as needed for wheezing or shortness of breath.  05/18/19   Laurey Morale, MD  allopurinol (ZYLOPRIM) 300 MG tablet Take 1 tablet by mouth once daily Patient taking differently: Take 300 mg by mouth daily.  11/22/19   Laurey Morale, MD  ALPRAZolam Duanne Moron) 1 MG tablet TAKE 1 TABLET BY MOUTH IN THE MORNING AND 1 & 1/2 (ONE & ONE-HALF) NIGHTLY AS NEEDED FOR ANXIETY Patient taking differently: Take 1 mg by mouth See admin instructions. 1mg  by mouth qam and 1.5mg  by mough every night as needed for anxiety. 11/22/19   Laurey Morale, MD  amiodarone (PACERONE) 200 MG tablet Take 1 tablet (200 mg total) by mouth 2 (two) times daily. 10/26/19   Shirley Friar, PA-C  aspirin 81 MG chewable tablet Chew 1 tablet (81 mg total) by mouth daily. 05/23/16   Reyne Dumas, MD  azelastine (ASTELIN) 0.1 % nasal spray USE 2 SPRAY(S) IN EACH NOSTRIL TWICE DAILY AS DIRECTED Patient taking differently: Place 2 sprays into both nostrils 2 (two) times daily.  08/08/19   Laurey Morale, MD  bisoprolol (ZEBETA) 5 MG tablet Take 1 tablet (5 mg total) by mouth daily. 09/28/19   Camnitz, Ocie Doyne, MD  cholecalciferol (VITAMIN D) 1000 units tablet Take 1,000 Units by mouth daily.    [provider]  clopidogrel (PLAVIX) 75 MG tablet Take 1 tablet by mouth once daily 06/30/19   Laurey Morale, MD    cyanocobalamin 1000 MCG tablet Take 1,000 mcg by mouth daily.    [provider]  DULoxetine (CYMBALTA) 60 MG capsule Take 1 capsule (60 mg total) by mouth 2 (two) times daily. 01/05/19   Laurey Morale, MD  Magnesium Oxide 400 MG CAPS Take 1 capsule (400 mg total) by mouth daily. 07/15/19   Croitoru, Mihai, MD  nitroGLYCERIN (NITROSTAT) 0.4 MG SL tablet Place 1 tablet (0.4 mg total) under the tongue every 5 (five) minutes as needed for chest pain. X 3 doses 11/02/19   Shirley Friar, PA-C  PROAIR HFA 108 (90 Base) MCG/ACT inhaler INHALE 2 PUFFS BY MOUTH EVERY 4 HOURS AS NEEDED FOR WHEEZING AND FOR SHORTNESS OF BREATH Patient taking differently: Inhale 2 puffs into the lungs every 4 (four) hours as needed for wheezing or shortness of breath.  12/02/19   Laurey Morale, MD  torsemide (DEMADEX) 20 MG tablet Take 2 tablets (40 mg total) by mouth 2 (two) times daily. 11/17/19 02/15/20  Baldwin Jamaica, PA-C    Physical Exam:  Constitutional: Elderly female who is lethargic, but arousable with verbal stimuli Vitals:   12/05/19 1244 12/05/19 1246 12/05/19 1247  Pulse: 66    Resp: (!) 27    Temp:   98.2 F (36.8 C)  TempSrc:   Temporal  SpO2: 98% 100%   Weight:   79.8 kg  Height:   5\' 5"  (1.651 m)   Eyes: PERRL, lids and conjunctivae normal ENMT: Mucous membranes are moist. Posterior pharynx clear of any exudate or lesions. Hard of hearing.  Neck: normal, supple, no masses, no thyromegaly Respiratory: Positive crackles heard in both lung fields.  Currently on BiPAP. Cardiovascular: Regular  rate and rhythm, no murmurs / rubs / gallops.  At least 1+ lower extremity edema. 2+ pedal pulses. No carotid bruits.  Abdomen: no tenderness, no masses palpated. No hepatosplenomegaly. Bowel sounds positive.  Musculoskeletal: no clubbing / cyanosis. No joint deformity upper and lower extremities. Good ROM, no contractures. Normal muscle tone.  Skin: no rashes, lesions, ulcers. No  induration Neurologic: CN 2-12 grossly intact. Sensation intact, DTR normal. Strength 5/5 in all 4.  Psychiatric: Lethargic, but patient we will awaken answer questions appropriately    Labs on Admission: I have personally reviewed following labs and imaging studies  CBC: Recent Labs  Lab 12/05/19 1259 12/05/19 1343  WBC 10.1  --   NEUTROABS 7.4  --   HGB 13.0 13.9  HCT 42.1 41.0  MCV 105.3*  --   PLT 276  --    Basic Metabolic Panel: Recent Labs  Lab 12/05/19 1259 12/05/19 1343  NA 143 143  K 4.6 4.0  CL 102  --   CO2 29  --   GLUCOSE 152*  --   BUN 15  --   CREATININE 1.24*  --   CALCIUM 9.0  --   MG 1.9  --   PHOS 3.1  --    GFR: Estimated Creatinine Clearance: 37.1 mL/min (A) (by C-G formula based on SCr of 1.24 mg/dL (H)). Liver Function Tests: Recent Labs  Lab 12/05/19 1259  AST 19  ALT 14  ALKPHOS 97  BILITOT 0.7  PROT 6.4*  ALBUMIN 3.6   No results for input(s): LIPASE, AMYLASE in the last 168 hours. No results for input(s): AMMONIA in the last 168 hours. Coagulation Profile: Recent Labs  Lab 12/05/19 1259  INR 1.1   Cardiac Enzymes: No results for input(s): CKTOTAL, CKMB, CKMBINDEX, TROPONINI in the last 168 hours. BNP (last 3 results) No results for input(s): PROBNP in the last 8760 hours. HbA1C: No results for input(s): HGBA1C in the last 72 hours. CBG: No results for input(s): GLUCAP in the last 168 hours. Lipid Profile: No results for input(s): CHOL, HDL, LDLCALC, TRIG, CHOLHDL, LDLDIRECT in the last 72 hours. Thyroid Function Tests: No results for input(s): TSH, T4TOTAL, FREET4, T3FREE, THYROIDAB in the last 72 hours. Anemia Panel: No results for input(s): VITAMINB12, FOLATE, FERRITIN, TIBC, IRON, RETICCTPCT in the last 72 hours. Urine analysis:    Component Value Date/Time   COLORURINE YELLOW 12/17/2016 [place] 12/17/2016 1634   LABSPEC 1.008 12/17/2016 1634   PHURINE 6.0 12/17/2016 1634   GLUCOSEU NEGATIVE  12/17/2016 1634   HGBUR NEGATIVE 12/17/2016 1634   HGBUR negative 01/01/2010 1353   BILIRUBINUR negative 07/02/2017 1025   KETONESUR NEGATIVE 12/17/2016 1634   PROTEINUR negative 07/02/2017 1025   PROTEINUR NEGATIVE 12/17/2016 1634   UROBILINOGEN 0.2 07/02/2017 1025   UROBILINOGEN 1.0 11/03/2014 1442   NITRITE negative 07/02/2017 1025   NITRITE NEGATIVE 12/17/2016 1634   LEUKOCYTESUR Negative 07/02/2017 1025   Sepsis Labs: No results found for this or any previous visit (from the past 240 hour(s)).   Radiological Exams on Admission: DG Chest Port 1 View  Result Date: 12/05/2019 CLINICAL DATA:  84 year old female with history of severe shortness of breath. Respiratory rest. EXAM: PORTABLE CHEST 1 VIEW COMPARISON:  Chest x-ray 10/22/2019. FINDINGS: There is cephalization of the pulmonary vasculature and slight indistinctness of the interstitial markings suggestive of mild pulmonary edema. Small left pleural effusion. Mild cardiomegaly. The patient is rotated to the right on today's exam, resulting in distortion of  the mediastinal contours and reduced diagnostic sensitivity and specificity for mediastinal pathology. Left-sided biventricular pacemaker device in place with lead tips projecting over the expected location of the right atrium, right ventricle and left ventricle via the coronary sinus and coronary veins. IMPRESSION: 1. Appearance the chest suggests mild congestive heart failure, as above. 2. Aortic atherosclerosis. Electronically Signed   By: Vinnie Langton M.D.   On: 12/05/2019 13:57    EKG: Independently reviewed.  Paced rhythm at 67 bpm  Assessment/Plan Respiratory failure with hypercapnia and hypoxia: Patient reportedly was found in respiratory distress with O2 saturations of 77% upon EMS arrival on her home 2 L of oxygen in respiratory distress.  Initially placed on CPAP.  Initial ABG appears compensated, but PCO2 elevated at 53.9.  -Admit to a progressive bed -Continuous  pulse oximetry -BiPAP  Combined systolic and diastolic congestive heart failure exacerbation: Acute on chronic.  Patient reportedly had increasing weight 280 pounds on last check, but baseline dry weight is supposed to be around 179 pounds.  Patient home regimen includes torsemide 60 mg twice daily.  She reportedly took an extra dose of 20 mg of torsemide yesterday.  Last echocardiogram from 08/24/2019 revealed EF of 20 to 25% with grade 2 diastolic dysfunction.  BNP elevated at 1385.6 with chest x-ray showing signs mild congestive heart failure. -Heart failure order set utilized -Continuous pulse oximetry with nasal cannula oxygen as needed to keep O2 saturations >92% -Strict I&Os and daily weights -Elevate lower extremities -Lasix 80 mg IV Bid -Adjustment of diuresis per cardiology -Cardiology consulted, will follow-up for further recommendation  COPD without acute exacerbation: On physical exam patient without signs of wheezing, but had been given 125 mg of Solu-Medrol IV by EMS. -DuoNeb breathing treatments 4 times daily -Reassess for need of continued IV steroids  Coronary artery disease: Patient underwent heart cath on 10/24/2019 which noted moderate three-vessel disease at that time, but medical management was recommended. -Continue aspirin, Plavix, and statin   Chronic kidney disease stage IIIb: Creatinine currently 1.24.  Patient noted to have suffered acute kidney injury during previous admissions with over diuresis. -Continue to monitor kidney function  Essential hypertension: Blood pressures currently controlled. -Hold bisoprolol and spironolactone per cardiology recommendations  History of VT S/p AICD: Device noted to be working adequately. -Continue Amiodarone  Tobacco abuse: Patient reportedly still continues to smoke cigarettes. -Continue counseling on need of cessation of tobacco use  Peripheral vascular disease: Patient with history of left subclavian stent. -Continue  aspirin Plavix and statin, DVT prophylaxis: Lovenox Code Status: Full, confirmed by son Family Communication: Discussed plan of care with the patient's son present at bedside Disposition Plan: Possible discharge home in 2 to 3 days Consults called: Cardiology Admission status: Inpatient  Norval Morton MD [facility] Pager [phone]   If 7PM-7AM, please contact night-coverage [email or web address] Password [facility]  12/05/2019, 2:28 PM

## 2019-12-05 NOTE — Progress Notes (Addendum)
Received pt from ED Bipap on. MEWS score 2.Pt responsive to voice. WIll endorsed to night RN.

## 2019-12-05 NOTE — Telephone Encounter (Signed)
Alert received for transmission on 12/05/19 that shows elevated Optivol and decreased thoracic impedance. Will forward to [facility] clinic for evaluation.

## 2019-12-05 NOTE — ED Notes (Signed)
Attempted report 
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
## 2019-12-06 DIAGNOSIS — J9622 Acute and chronic respiratory failure with hypercapnia: Secondary | ICD-10-CM

## 2019-12-06 DIAGNOSIS — Z9861 Coronary angioplasty status: Secondary | ICD-10-CM

## 2019-12-06 DIAGNOSIS — N1832 Chronic kidney disease, stage 3b: Secondary | ICD-10-CM

## 2019-12-06 DIAGNOSIS — I739 Peripheral vascular disease, unspecified: Secondary | ICD-10-CM

## 2019-12-06 DIAGNOSIS — J439 Emphysema, unspecified: Secondary | ICD-10-CM

## 2019-12-06 DIAGNOSIS — I2510 Atherosclerotic heart disease of native coronary artery without angina pectoris: Secondary | ICD-10-CM

## 2019-12-06 LAB — BASIC METABOLIC PANEL
Anion gap: 10 (ref 5–15)
BUN: 19 mg/dL (ref 8–23)
CO2: 33 mmol/L — ABNORMAL HIGH (ref 22–32)
Calcium: 9.1 mg/dL (ref 8.9–10.3)
Chloride: 100 mmol/L (ref 98–111)
Creatinine, Ser: 1.49 mg/dL — ABNORMAL HIGH (ref 0.44–1.00)
GFR calc Af Amer: 38 mL/min — ABNORMAL LOW (ref >60)
GFR calc non Af Amer: 33 mL/min — ABNORMAL LOW (ref >60)
Glucose, Bld: 166 mg/dL — ABNORMAL HIGH (ref 70–99)
Potassium: 3.8 mmol/L (ref 3.5–5.1)
Sodium: 143 mmol/L (ref 135–145)

## 2019-12-06 LAB — CBC
HCT: 39.9 % (ref 36.0–46.0)
Hemoglobin: 12.4 g/dL (ref 12.0–15.0)
MCH: 32.2 pg (ref 26.0–34.0)
MCHC: 31.1 g/dL (ref 30.0–36.0)
MCV: 103.6 fL — ABNORMAL HIGH (ref 80.0–100.0)
Platelets: 257 10*3/uL (ref 150–400)
RBC: 3.85 MIL/uL — ABNORMAL LOW (ref 3.87–5.11)
RDW: 16.5 % — ABNORMAL HIGH (ref 11.5–15.5)
WBC: 7.6 10*3/uL (ref 4.0–10.5)
nRBC: 0 % (ref 0.0–0.2)

## 2019-12-06 MED ORDER — NITROGLYCERIN IN D5W 200-5 MCG/ML-% IV SOLN
10.0000 ug/min | INTRAVENOUS | Status: DC
  Administered 2019-12-06: 10 ug/min via INTRAVENOUS
  Filled 2019-12-06: qty 250

## 2019-12-06 NOTE — Progress Notes (Signed)
PROGRESS NOTE    Sydney Phillips  ZSW:[id number] DOB: 1935-07-15 DOA: 12/05/2019 PCP: Laurey Morale, MD    Chief Complaint  Patient presents with  . Respiratory Distress    Brief Narrative:  84 year old lady prior history of coronary artery disease s/p PCI, ischemic cardiomyopathy chronic systolic heart failure, left ventricular ejection fraction of 20 to 25%, stage IIl CKD, severe COPD on 2 L of nasal cannula oxygen, hypertension, type 2 diabetes, history of VT/VF s/p AICD, peripheral vascular disease, obstructive sleep apnea, hyperlipidemia, [place] to ED with complaints of shortness of breath since Monday.  On arrival to ED patient was lethargic and required BiPAP.  While in ED she was given IV Solu-Medrol and duo nebs and referred to [facility] for admission.  Chest x-ray showed mild CHF.  ABG on admission revealed pH of 7.4, PCO2 53, PO2 of 94.  She was started on IV Lasix and cardiology consulted for further evaluation.  Patient seen and examined this morning, she was weaned off BiPAP and transition to 3 L of nasal cannula oxygen.  Assessment & Plan:   Principal Problem:   Acute on chronic respiratory failure with hypoxia and hypercapnia (HCC) Active Problems:   COPD (chronic obstructive pulmonary disease) (HCC)   PVD,   CAD S/P percutaneous coronary angioplasty   CKD (chronic kidney disease), stage III   Acute on chronic congestive heart failure (HCC)  Acute respiratory failure with hypoxia and hypercapnia probably secondary to acute on chronic combined systolic and diastolic heart failure.  Initially required BiPAP, transition to 3 L of nasal cannula oxygen this morning.  Patient is currently on IV Lasix 80 mg twice daily. Continue to monitor urine output with strict intake and output.  Daily weights. Last echocardiogram from 08/24/2019 revealed left ventricular ejection fraction of 20 to 25% with grade 2 diastolic dysfunction.  Chest x-ray last night showed mild congestive heart  failure.  Further diuresis as per cardiology.   COPD, no wheezing heard on exam today Duo nebs as needed.    History of coronary artery disease s/p recent cath on 10/24/2019, at which time medical management was recommended. Continue with aspirin, Plavix and statin.   Essential hypertension Blood pressure parameters slightly elevated this afternoon.  Recheck blood pressure.   History of VT/VF S/p AICD Continue with amiodarone.   History of peripheral vascular disease, s/p left subclavian stent Continue with aspirin and statin, Plavix.  Stage IIIb CKD Monitor creatinine while on IV Lasix   DVT prophylaxis: Lovenox Code Status: Full code Family Communication: (None at bedside Disposition:   Status is: Inpatient  Remains inpatient appropriate because:IV treatments appropriate due to intensity of illness or inability to take PO   Dispo: The patient is from: Home              Anticipated d/c is to: Pending              Anticipated d/c date is: > 3 days              Patient currently is not medically stable to d/c.       Consultants:   Cardiology  Procedures: None Antimicrobials: None  Subjective: Patient denies any chest pain at this time no nausea vomiting or abdominal pain.  Reports her breathing has improved Objective: Vitals:   12/06/19 1355 12/06/19 1400 12/06/19 1500 12/06/19 1538  BP:  131/61 (!) 161/76   Pulse:  92 73   Resp:  18 16   Temp:  TempSrc:      SpO2: 95% 93% 90% 96%  Weight:      Height:        Intake/Output Summary (Last 24 hours) at 12/06/2019 1629 Last data filed at 12/06/2019 0116 Gross per 24 hour  Intake --  Output 375 ml  Net -375 ml   Filed Weights   12/05/19 1247 12/05/19 1900 12/06/19 0500  Weight: 79.8 kg 87 kg 87.9 kg    Examination:  General exam: Alert, on 3 L of nasal cannula oxygen, not in any kind of distress Respiratory system: Scattered Rales bilateral, tachypnea present, no wheezing  heard Cardiovascular system: S1 & S2 heard, RRR. No JVD,,  Gastrointestinal system: Diminished soft, nontender, nondistended bowel sounds normal Central nervous system: Alert and oriented. No focal neurological deficits. Extremities: Symmetric 5 x 5 power. Skin: No rashes, lesions or ulcers Psychiatry: Mood is appropriate    Data Reviewed: I have personally reviewed following labs and imaging studies  CBC: Recent Labs  Lab 12/05/19 1259 12/05/19 1343 12/06/19 0553  WBC 10.1  --  7.6  NEUTROABS 7.4  --   --   HGB 13.0 13.9 12.4  HCT 42.1 41.0 39.9  MCV 105.3*  --  103.6*  PLT 276  --  270    Basic Metabolic Panel: Recent Labs  Lab 12/05/19 1259 12/05/19 1343 12/06/19 0553  NA 143 143 143  K 4.6 4.0 3.8  CL 102  --  100  CO2 29  --  33*  GLUCOSE 152*  --  166*  BUN 15  --  19  CREATININE 1.24*  --  1.49*  CALCIUM 9.0  --  9.1  MG 1.9  --   --   PHOS 3.1  --   --     GFR: Estimated Creatinine Clearance: 32.4 mL/min (A) (by C-G formula based on SCr of 1.49 mg/dL (H)).  Liver Function Tests: Recent Labs  Lab 12/05/19 1259  AST 19  ALT 14  ALKPHOS 97  BILITOT 0.7  PROT 6.4*  ALBUMIN 3.6    CBG: No results for input(s): GLUCAP in the last 168 hours.   Recent Results (from the past 240 hour(s))  Culture, blood (routine x 2)     Status: None (Preliminary result)   Collection Time: 12/05/19 12:59 PM   Specimen: BLOOD RIGHT FOREARM  Result Value Ref Range Status   Specimen Description BLOOD RIGHT FOREARM  Final   Special Requests   Final    BOTTLES DRAWN AEROBIC AND ANAEROBIC Blood Culture adequate volume   Culture   Final    NO GROWTH < 24 HOURS Performed at [facility], [address], [place], [place] [address]    Report Status PENDING  Incomplete  SARS Coronavirus 2 by RT PCR (hospital order, performed in [facility] hospital lab) Nasopharyngeal Nasopharyngeal Swab     Status: None   Collection Time: 12/05/19 12:59 PM   Specimen:  Nasopharyngeal Swab  Result Value Ref Range Status   SARS Coronavirus 2 NEGATIVE NEGATIVE Final    Comment: (NOTE) SARS-CoV-2 target nucleic acids are NOT DETECTED.  The SARS-CoV-2 RNA is generally detectable in upper and lower respiratory specimens during the acute phase of infection. The lowest concentration of SARS-CoV-2 viral copies this assay can detect is 250 copies / mL. A negative result does not preclude SARS-CoV-2 infection and should not be used as the sole basis for treatment or other patient management decisions.  A negative result may occur with  improper specimen collection / handling, submission of specimen other than nasopharyngeal swab, presence of viral mutation(s) within the areas targeted by this assay, and inadequate number of viral copies (<250 copies / mL). A negative result must be combined with clinical observations, patient history, and epidemiological information.  Fact Sheet for Patients:   StrictlyIdeas.no  Fact Sheet for Healthcare Providers: BankingDealers.co.za  This test is not yet approved or  cleared by the [place] FDA and has been authorized for detection and/or diagnosis of SARS-CoV-2 by FDA under an Emergency Use Authorization (EUA).  This EUA will remain in effect (meaning this test can be used) for the duration of the COVID-19 declaration under Section 564(b)(1) of the Act, 21 U.S.C. section 360bbb-3(b)(1), unless the authorization is terminated or revoked sooner.  Performed at [facility], [place] [address], [place], [place] [address]   Culture, blood (routine x 2)     Status: None (Preliminary result)   Collection Time: 12/05/19  3:06 PM   Specimen: BLOOD RIGHT HAND  Result Value Ref Range Status   Specimen Description BLOOD RIGHT HAND  Final   Special Requests   Final    BOTTLES DRAWN AEROBIC AND ANAEROBIC Blood Culture results may not be optimal due to an inadequate volume of  blood received in culture bottles   Culture   Final    NO GROWTH < 24 HOURS Performed at [facility], [place] [address] [place], [place] [address]    Report Status PENDING  Incomplete         Radiology Studies: DG Chest Port 1 View  Result Date: 12/05/2019 CLINICAL DATA:  84 year old female with history of severe shortness of breath. Respiratory rest. EXAM: PORTABLE CHEST 1 VIEW COMPARISON:  Chest x-ray 10/22/2019. FINDINGS: There is cephalization of the pulmonary vasculature and slight indistinctness of the interstitial markings suggestive of mild pulmonary edema. Small left pleural effusion. Mild cardiomegaly. The patient is rotated to the right on today's exam, resulting in distortion of the mediastinal contours and reduced diagnostic sensitivity and specificity for mediastinal pathology. Left-sided biventricular pacemaker device in place with lead tips projecting over the expected location of the right atrium, right ventricle and left ventricle via the coronary sinus and coronary veins. IMPRESSION: 1. Appearance the chest suggests mild congestive heart failure, as above. 2. Aortic atherosclerosis. Electronically Signed   By: Vinnie Langton M.D.   On: 12/05/2019 13:57        Scheduled Meds: . allopurinol  300 mg Oral Daily  . ALPRAZolam  1 mg Oral Daily  . amiodarone  200 mg Oral BID  . aspirin  81 mg Oral Daily  . azelastine  2 spray Each Nare BID  . clopidogrel  75 mg Oral Daily  . DULoxetine  60 mg Oral BID  . enoxaparin (LOVENOX) injection  40 mg Subcutaneous Q24H  . furosemide  80 mg Intravenous BID  . ipratropium-albuterol  3 mL Nebulization QID  . magnesium oxide  400 mg Oral Daily  . sodium chloride flush  3 mL Intravenous Q12H   Continuous Infusions: . sodium chloride    . nitroGLYCERIN 10 mcg/min (12/06/19 1024)     LOS: 1 day        Hosie Poisson, MD [facility]   To contact the attending provider between 7A-7P or the covering provider  during after hours 7P-7A, please log into the web site [email or web address] and access using universal [place] password for that web site. If you do not have  the password, please call the hospital operator.  12/06/2019, 4:29 PM

## 2019-12-06 NOTE — Telephone Encounter (Signed)
Patient currently hospitalized.

## 2019-12-06 NOTE — Progress Notes (Signed)
PT Cancellation Note  Patient Details Name: Sydney Phillips MRN: [id number] DOB: [birth year]   Cancelled Treatment:    Reason Eval/Treat Not Completed: Other (comment).  Pt reports she just arrived to the unit yesterday, and is expecting to get something to drink.  Currently NPO, will reattempt therapy as time and pt will allow.   Ramond Dial 12/06/2019, 2:08 PM   Mee Hives, PT MS Acute Rehab Dept. Number: [place] and [place]

## 2019-12-06 NOTE — Progress Notes (Signed)
Patient transitioned from bipap to 5L O2 Sydney Phillips. Vital signs within normal limits. Will continue to monitor effort of breathing. Respiratory aware of transition.

## 2019-12-06 NOTE — Progress Notes (Signed)
Progress Note  Patient Name: Sydney Phillips Date of Encounter: 12/06/2019  [facility] Cardiologist: [name], MD   Subjective   Unable to tolerate transition to [place] and she is back on BiPAP. Currently appears comfortable, able to have a conversation, even w BiPAP. In/out not charted. Weight 88 kg (194 lb). Target "dry weight" is probably 175-180 lb. Maintaining normal rhythm (ApBiVp). Creatinine up slightly at 1.4, but her true baseline when CHF is compensated is probably around 1.5-1.8. K and Mg are OK.  Inpatient Medications    Scheduled Meds: . allopurinol  300 mg Oral Daily  . ALPRAZolam  1 mg Oral Daily  . amiodarone  200 mg Oral BID  . aspirin  81 mg Oral Daily  . azelastine  2 spray Each Nare BID  . clopidogrel  75 mg Oral Daily  . DULoxetine  60 mg Oral BID  . enoxaparin (LOVENOX) injection  40 mg Subcutaneous Q24H  . furosemide  80 mg Intravenous BID  . ipratropium-albuterol  3 mL Nebulization QID  . magnesium oxide  400 mg Oral Daily  . sodium chloride flush  3 mL Intravenous Q12H   Continuous Infusions: . sodium chloride    . nitroGLYCERIN     PRN Meds: sodium chloride, ALPRAZolam, ondansetron (ZOFRAN) IV, sodium chloride flush   Vital Signs    Vitals:   12/06/19 0500 12/06/19 0600 12/06/19 0811 12/06/19 0813  BP: (!) 137/56   (!) 136/58  Pulse: 60   60  Resp: 17   16  Temp: 98.5 F (36.9 C) 98.7 F (37.1 C)    TempSrc: Axillary Oral    SpO2: 99%  98% 97%  Weight: 87.9 kg     Height:        Intake/Output Summary (Last 24 hours) at 12/06/2019 0947 Last data filed at 12/06/2019 0116 Gross per 24 hour  Intake --  Output 375 ml  Net -375 ml   Last 3 Weights 12/06/2019 12/05/2019 12/05/2019  Weight (lbs) 193 lb 12.6 oz 191 lb 12.8 oz 175 lb 14.8 oz  Weight (kg) 87.9 kg 87 kg 79.8 kg      Telemetry    ApBiVp - Personally Reviewed  ECG    No new tracing - Personally Reviewed  Physical Exam  On BiPAP GEN: No acute distress.    Neck: 9-10 cm JVD Cardiac: RRR, no murmurs, rubs, or gallops.  Respiratory: diminished breath sounds, but clear to auscultation bilaterally. GI: Soft, nontender, non-distended  MS: No edema; No deformity. Neuro:  Nonfocal  Psych: Normal affect   Labs    High Sensitivity Troponin:   Recent Labs  Lab 12/05/19 1259 12/05/19 1545  TROPONINIHS 22* 20*      Chemistry Recent Labs  Lab 12/05/19 1259 12/05/19 1343 12/06/19 0553  NA 143 143 143  K 4.6 4.0 3.8  CL 102  --  100  CO2 29  --  33*  GLUCOSE 152*  --  166*  BUN 15  --  19  CREATININE 1.24*  --  1.49*  CALCIUM 9.0  --  9.1  PROT 6.4*  --   --   ALBUMIN 3.6  --   --   AST 19  --   --   ALT 14  --   --   ALKPHOS 97  --   --   BILITOT 0.7  --   --   GFRNONAA 41*  --  33*  GFRAA 47*  --  38*  ANIONGAP  12  --  10     Hematology Recent Labs  Lab 12/05/19 1259 12/05/19 1343 12/06/19 0553  WBC 10.1  --  7.6  RBC 4.00  --  3.85*  HGB 13.0 13.9 12.4  HCT 42.1 41.0 39.9  MCV 105.3*  --  103.6*  MCH 32.5  --  32.2  MCHC 30.9  --  31.1  RDW 16.7*  --  16.5*  PLT 276  --  257    BNP Recent Labs  Lab 12/05/19 1259  BNP 1,385.6*     DDimer No results for input(s): DDIMER in the last 168 hours.   Radiology    DG Chest Port 1 View  Result Date: 12/05/2019 CLINICAL DATA:  84 year old female with history of severe shortness of breath. Respiratory rest. EXAM: PORTABLE CHEST 1 VIEW COMPARISON:  Chest x-ray 10/22/2019. FINDINGS: There is cephalization of the pulmonary vasculature and slight indistinctness of the interstitial markings suggestive of mild pulmonary edema. Small left pleural effusion. Mild cardiomegaly. The patient is rotated to the right on today's exam, resulting in distortion of the mediastinal contours and reduced diagnostic sensitivity and specificity for mediastinal pathology. Left-sided biventricular pacemaker device in place with lead tips projecting over the expected location of the right  atrium, right ventricle and left ventricle via the coronary sinus and coronary veins. IMPRESSION: 1. Appearance the chest suggests mild congestive heart failure, as above. 2. Aortic atherosclerosis. Electronically Signed   By: Vinnie Langton M.D.   On: 12/05/2019 13:57    Cardiac Studies   Device in office check 11-29-2019 CRT-D device check in office. Thresholds and sensing consistent with previous device measurements. Lead impedance trends stable over time. No mode switch episodes recorded. Known VF with shock, was admitted at the time. Patient bi-ventricularly pacing  98.76% of the time. Device programmed with appropriate safety margins. Heart failure diagnostics reviewed and trends are stable for patient. No changes made this session. Estimated longevity 1 yr, 5 mo. Patient enrolled in remote follow up. Next  11/09/2019. RTC 4 weeks for torsemide adjustment prn.  Left heart catheterization 10/24/2019  Moderate three-vessel coronary disease.  30% ostial left main.  60% ostial LAD (unchanged from 2017) with moderate mid LAD plaque and calcification.  Patent proximal to mid circumflex with diffuse 50% in-stent restenosis. Ostial circumflex contains 30 to 40% narrowing.  RCA contains up to 50% diffuse in-stent restenosis. Proximal to the stented segment is an eccentric 70% region of narrowing  Elevated LVEDP, 31 mmHg suggesting decompensated acute on chronic combined systolic heart failure.  Catheterization from right radial was compromised by difficulty with catheter advancement, most likely due to selection of the radial recurrent route to the subclavian artery requiring that catheter size be downgraded to 4 [place].  Recommendations:   No high-grade thrombotic appearing lesions noted. Three-vessel disease as noted above.  Coronary angiography/PCI attempt in the future should not be via the right radial approach.  Guideline directed therapy for acute on chronic combined  systolic and diastolic heart failure. Consider advanced heart failure team consult.  Echocardiogram 08/24/2019 1. Left ventricular ejection fraction, by estimation, is 20 to 25%. The  left ventricle has severely decreased function. The left ventricle  demonstrates global hypokinesis. The left ventricular internal cavity size  was mildly dilated. Left ventricular  diastolic parameters are consistent with Grade II diastolic dysfunction  (pseudonormalization).  2. Right ventricular systolic function is normal. The right ventricular  size is normal. There is moderately elevated pulmonary artery systolic  pressure.  3. The mitral valve is normal in structure. Moderate mitral valve  regurgitation.  4. Tricuspid valve regurgitation is mild to moderate.  5. The aortic valve is normal in structure. Aortic valve regurgitation is  trivial. No aortic stenosis is present.   Comparison(s): 07/20/17 EF 35-40%.   Patient Profile     84 y.o. female with a hx of CHF, CAD, VT, CRT-D, COPD who presented with acute hypoxic respiratory failure due to CHF exacerbation with respiratory failure   Assessment & Plan    1. Acute respiratory failure with hypoxemia/pulmonary edema: Improving with diuretics, still needs BiPAP.  2. Acute on chronic combined systolic and diastolic heart failure: Output not properly charted. Just received another dose of diuretic. Multiple changes have been made to her diuretic regimen over the last month, likely leading to volume retention.  A week ago she was already showing signs of fluid overload by OptiVol, but we have been unable to reach her by phone.  Intravenous diuretics are being administered.  We need to reestablish her "dry weight", but definitely appears to be < 180 lb.  Management of heart failure has become more complicated with worsening underlying renal function, we will need to tolerate higher BUN/creat levels.   - She has been intolerant of RAAS inhibitors in the  past due to orthostatic syncope/hypotension with falls (she reports 3 falls at home in the last 3 weeks, although she denies true syncope). With the volatility that her renal function has shown recently, I do not believe that this is a good time to try again.  - Spironolactone has been discontinued. Restart bisoprolol once out of CHF exacerbation.  - BP running high and unable to take PO right now.  - Will add IV NTG for nest 24-48 h. 3. CAD: No evidence of acute coronary syndrome, recent cardiac catheterization did not show any high risk/severe stenoses.  Medical management is recommended.  On statin, aspirin/clopidogrel, beta-blocker (bisoprolol temporarily on hold). 4. VT: She has had multiple appropriate defibrillator discharges since device implantation, of which she has not always been aware.  Currently on amiodarone without recent VT.  We will need to review at what point we should decrease her dose of amiodarone.  Will request Dr. Macky Lower opinion. 5. CRT-D: Normal device function with 98% biventricular pacing and evidence of effective LV capture by ECG and recent device check.  Continue remote downloads after DC. 6. COPD: Lowers her threshold for respiratory decompensation.  Unfortunately she continues to smoke (Quit 2019, restarted?). On bisoprolol for its highly selective beta 1 properties. 7. PAD: check BP right arm. Hx L subclavian stent. Has abnormal ABI, asymptomatic (sedentary). Previous L carotid endarterectomy. 8. Dyslipidemia: have tried numerous statins, stopped due to side effects. She declined PCSK9 inh.     For questions or updates, please contact [place] Please consult [email or web address] for contact info under        Signed, [name], MD  12/06/2019, 9:47 AM

## 2019-12-06 NOTE — Progress Notes (Signed)
Patient is back on bipap; though vital signs remained WNL, breathing was noticeably labored with accessory muscle usage.

## 2019-12-06 NOTE — Progress Notes (Signed)
Unable to reach patient for 11/28/2019 ICM remote transmission and was a no show at 11/28/2019 for office defib check with Oda Kilts, PA.  Patient currently hospitalized and ICM transmission scheduled for 12/14/2019.

## 2019-12-07 DIAGNOSIS — I10 Essential (primary) hypertension: Secondary | ICD-10-CM

## 2019-12-07 DIAGNOSIS — I472 Ventricular tachycardia: Secondary | ICD-10-CM

## 2019-12-07 LAB — BASIC METABOLIC PANEL
Anion gap: 13 (ref 5–15)
BUN: 33 mg/dL — ABNORMAL HIGH (ref 8–23)
CO2: 31 mmol/L (ref 22–32)
Calcium: 9 mg/dL (ref 8.9–10.3)
Chloride: 96 mmol/L — ABNORMAL LOW (ref 98–111)
Creatinine, Ser: 1.45 mg/dL — ABNORMAL HIGH (ref 0.44–1.00)
GFR calc Af Amer: 39 mL/min — ABNORMAL LOW (ref >60)
GFR calc non Af Amer: 34 mL/min — ABNORMAL LOW (ref >60)
Glucose, Bld: 136 mg/dL — ABNORMAL HIGH (ref 70–99)
Potassium: 4.1 mmol/L (ref 3.5–5.1)
Sodium: 140 mmol/L (ref 135–145)

## 2019-12-07 MED ORDER — GUAIFENESIN ER 600 MG PO TB12
1200.0000 mg | ORAL_TABLET | Freq: Two times a day (BID) | ORAL | Status: DC
  Administered 2019-12-07 – 2019-12-12 (×10): 1200 mg via ORAL
  Filled 2019-12-07 (×10): qty 2

## 2019-12-07 MED ORDER — IPRATROPIUM-ALBUTEROL 0.5-2.5 (3) MG/3ML IN SOLN
3.0000 mL | Freq: Four times a day (QID) | RESPIRATORY_TRACT | Status: DC | PRN
  Filled 2019-12-07: qty 3

## 2019-12-07 MED ORDER — PANTOPRAZOLE SODIUM 40 MG PO TBEC
40.0000 mg | DELAYED_RELEASE_TABLET | Freq: Every day | ORAL | Status: DC
  Administered 2019-12-07 – 2019-12-12 (×6): 40 mg via ORAL
  Filled 2019-12-07 (×6): qty 1

## 2019-12-07 MED ORDER — IPRATROPIUM-ALBUTEROL 0.5-2.5 (3) MG/3ML IN SOLN
3.0000 mL | Freq: Four times a day (QID) | RESPIRATORY_TRACT | Status: DC
  Administered 2019-12-07 (×3): 3 mL via RESPIRATORY_TRACT
  Filled 2019-12-07 (×2): qty 3

## 2019-12-07 MED ORDER — FUROSEMIDE 10 MG/ML IJ SOLN
120.0000 mg | Freq: Two times a day (BID) | INTRAVENOUS | Status: DC
  Administered 2019-12-07 – 2019-12-08 (×2): 120 mg via INTRAVENOUS
  Filled 2019-12-07: qty 12
  Filled 2019-12-07: qty 10
  Filled 2019-12-07: qty 12

## 2019-12-07 MED ORDER — LORATADINE 10 MG PO TABS
10.0000 mg | ORAL_TABLET | Freq: Every day | ORAL | Status: DC
  Administered 2019-12-07 – 2019-12-12 (×6): 10 mg via ORAL
  Filled 2019-12-07 (×6): qty 1

## 2019-12-07 MED ORDER — GUAIFENESIN ER 600 MG PO TB12
600.0000 mg | ORAL_TABLET | Freq: Two times a day (BID) | ORAL | Status: DC
  Administered 2019-12-07: 600 mg via ORAL
  Filled 2019-12-07: qty 1

## 2019-12-07 MED ORDER — ORAL CARE MOUTH RINSE
15.0000 mL | Freq: Two times a day (BID) | OROMUCOSAL | Status: DC
  Administered 2019-12-07 – 2019-12-12 (×11): 15 mL via OROMUCOSAL

## 2019-12-07 NOTE — Progress Notes (Signed)
PT Cancellation Note  Patient Details Name: Sydney Phillips MRN: [id number] DOB: 08-Jan-1936   Cancelled Treatment:    Reason Eval/Treat Not Completed: Other (comment).  Have made three attempts this AM to see pt, will reattempt visit as pt can allow.   Ramond Dial 12/07/2019, 9:39 AM   Mee Hives, PT MS Acute Rehab Dept. Number: [place] and [place]

## 2019-12-07 NOTE — Progress Notes (Signed)
Patient refused standing weight citing concerns for ability to stand. Educated on importance of accurate daily weights with consistent scales. Obtained bed weight in lieu of standing weight.

## 2019-12-07 NOTE — Progress Notes (Signed)
Progress Note  Patient Name: Sydney Phillips Date of Encounter: 12/07/2019  [facility] Cardiologist: [name], MD   Subjective   Breathing better, still orthopneic. Able to keep O2 off while eating. 1.5 lb reduction in weight, still well above previously estimated "dry weight". UO underestimated due to leak in collection system. Has a thick cough, sticky sputum. Remains in normal rhythm, stable renal parameters and normal electrolytes.  Inpatient Medications    Scheduled Meds:  allopurinol  300 mg Oral Daily   ALPRAZolam  1 mg Oral Daily   amiodarone  200 mg Oral BID   aspirin  81 mg Oral Daily   azelastine  2 spray Each Nare BID   clopidogrel  75 mg Oral Daily   DULoxetine  60 mg Oral BID   enoxaparin (LOVENOX) injection  40 mg Subcutaneous Q24H   guaiFENesin  600 mg Oral BID   ipratropium-albuterol  3 mL Nebulization Q6H   magnesium oxide  400 mg Oral Daily   sodium chloride flush  3 mL Intravenous Q12H   Continuous Infusions:  sodium chloride     furosemide     nitroGLYCERIN 10 mcg/min (12/06/19 1024)   PRN Meds: sodium chloride, ALPRAZolam, ipratropium-albuterol, ondansetron (ZOFRAN) IV, sodium chloride flush   Vital Signs    Vitals:   12/07/19 0600 12/07/19 0626 12/07/19 0909 12/07/19 0915  BP:  129/66    Pulse: 65  64   Resp: (!) 21  14   Temp:  97.7 F (36.5 C)    TempSrc:  Oral    SpO2: 95%  95% 97%  Weight:      Height:        Intake/Output Summary (Last 24 hours) at 12/07/2019 0916 Last data filed at 12/07/2019 0413 Gross per 24 hour  Intake 832.15 ml  Output 1350 ml  Net -517.85 ml   Last 3 Weights 12/07/2019 12/06/2019 12/05/2019  Weight (lbs) 192 lb 1.6 oz 193 lb 12.6 oz 191 lb 12.8 oz  Weight (kg) 87.136 kg 87.9 kg 87 kg      Telemetry    A paced - BiV paced - Personally Reviewed  ECG    No new tracing - Personally Reviewed  Physical Exam   GEN: No acute distress.   Neck: 8-9 cm JVD Cardiac: RRR, 1/6  holosystolic LLSB murmur, no rubs or gallops.  Respiratory: distant breath sounds, scattered rhonchi and rare wheezes. GI: Soft, nontender, non-distended  MS: No edema; No deformity. Neuro:  Nonfocal  Psych: Normal affect   Labs    High Sensitivity Troponin:   Recent Labs  Lab 12/05/19 1259 12/05/19 1545  TROPONINIHS 22* 20*      Chemistry Recent Labs  Lab 12/05/19 1259 12/05/19 1259 12/05/19 1343 12/06/19 0553 12/07/19 0344  NA 143   < > 143 143 140  K 4.6   < > 4.0 3.8 4.1  CL 102  --   --  100 96*  CO2 29  --   --  33* 31  GLUCOSE 152*  --   --  166* 136*  BUN 15  --   --  19 33*  CREATININE 1.24*  --   --  1.49* 1.45*  CALCIUM 9.0  --   --  9.1 9.0  PROT 6.4*  --   --   --   --   ALBUMIN 3.6  --   --   --   --   AST 19  --   --   --   --  ALT 14  --   --   --   --   ALKPHOS 97  --   --   --   --   BILITOT 0.7  --   --   --   --   GFRNONAA 41*  --   --  33* 34*  GFRAA 47*  --   --  38* 39*  ANIONGAP 12  --   --  10 13   < > = values in this interval not displayed.     Hematology Recent Labs  Lab 12/05/19 1259 12/05/19 1343 12/06/19 0553  WBC 10.1  --  7.6  RBC 4.00  --  3.85*  HGB 13.0 13.9 12.4  HCT 42.1 41.0 39.9  MCV 105.3*  --  103.6*  MCH 32.5  --  32.2  MCHC 30.9  --  31.1  RDW 16.7*  --  16.5*  PLT 276  --  257    BNP Recent Labs  Lab 12/05/19 1259  BNP 1,385.6*     DDimer No results for input(s): DDIMER in the last 168 hours.   Radiology    DG Chest Port 1 View  Result Date: 12/05/2019 CLINICAL DATA:  84 year old female with history of severe shortness of breath. Respiratory rest. EXAM: PORTABLE CHEST 1 VIEW COMPARISON:  Chest x-ray 10/22/2019. FINDINGS: There is cephalization of the pulmonary vasculature and slight indistinctness of the interstitial markings suggestive of mild pulmonary edema. Small left pleural effusion. Mild cardiomegaly. The patient is rotated to the right on today's exam, resulting in distortion of the  mediastinal contours and reduced diagnostic sensitivity and specificity for mediastinal pathology. Left-sided biventricular pacemaker device in place with lead tips projecting over the expected location of the right atrium, right ventricle and left ventricle via the coronary sinus and coronary veins. IMPRESSION: 1. Appearance the chest suggests mild congestive heart failure, as above. 2. Aortic atherosclerosis. Electronically Signed   By: Vinnie Langton M.D.   On: 12/05/2019 13:57    Cardiac Studies   Device in office check 2019-11-08 CRT-D device check in office. Thresholds and sensing consistent with previous device measurements. Lead impedance trends stable over time. No mode switch episodes recorded. Known VF with shock, was admitted at the time. Patient bi-ventricularly pacing  98.76% of the time. Device programmed with appropriate safety margins. Heart failure diagnostics reviewed and trends are stable for patient. No changes made this session. Estimated longevity 1 yr, 5 mo. Patient enrolled in remote follow up. Next  11/09/2019. RTC 4 weeks for torsemide adjustment prn.  Left heart catheterization 10/24/2019  Moderate three-vessel coronary disease.  30% ostial left main.  60% ostial LAD (unchanged from 2017) with moderate mid LAD plaque and calcification.  Patent proximal to mid circumflex with diffuse 50% in-stent restenosis. Ostial circumflex contains 30 to 40% narrowing.  RCA contains up to 50% diffuse in-stent restenosis. Proximal to the stented segment is an eccentric 70% region of narrowing  Elevated LVEDP, 31 mmHg suggesting decompensated acute on chronic combined systolic heart failure.  Catheterization from right radial was compromised by difficulty with catheter advancement, most likely due to selection of the radial recurrent route to the subclavian artery requiring that catheter size be downgraded to 4 [place].  Recommendations:   No high-grade thrombotic  appearing lesions noted. Three-vessel disease as noted above.  Coronary angiography/PCI attempt in the future should not be via the right radial approach.  Guideline directed therapy for acute on chronic combined systolic and diastolic heart failure. Consider  advanced heart failure team consult.  Echocardiogram3/03/2020 1. Left ventricular ejection fraction, by estimation, is 20 to 25%. The  left ventricle has severely decreased function. The left ventricle  demonstrates global hypokinesis. The left ventricular internal cavity size  was mildly dilated. Left ventricular  diastolic parameters are consistent with Grade II diastolic dysfunction  (pseudonormalization).  2. Right ventricular systolic function is normal. The right ventricular  size is normal. There is moderately elevated pulmonary artery systolic  pressure.  3. The mitral valve is normal in structure. Moderate mitral valve  regurgitation.  4. Tricuspid valve regurgitation is mild to moderate.  5. The aortic valve is normal in structure. Aortic valve regurgitation is  trivial. No aortic stenosis is present.   Comparison(s): 07/20/17 EF 35-40%.   Patient Profile     84 y.o. female with a hx of CHF, CAD, VT, CRT-D, COPDwho presented with acute hypoxic respiratory failure due to CHF exacerbation with respiratory failure  Assessment & Plan    1. Acute respiratory failure with hypoxemia and hypercapnia/pulmonary edema:Improving with diuretics, still needs BiPAP.  2. Acute on chronic combined systolic and diastolic heart failure:Output not properly charted. Just received another dose of diuretic. Multiple changes have been made to her diuretic regimen over the last month, likely leading to volume retention. A week ago she was already showing signs of fluid overload by OptiVol, but we have been unable to reach her by phone. Intravenous diuretics are being administered. We need to reestablish her "dry weight", but  definitely appears to be < 180 lb. Management of heart failure has become more complicated with worsening underlying renal function, we will need to tolerate higher BUN/creat levels.  - Diuretic dose increased to 120 mg q12h - She has been intolerant of RAAS inhibitors in the past due to orthostaticsyncope/hypotensionwith falls(she reports 3 falls at home in the last 3 weeks, although she denies true syncope). With the volatility that her renal function has shown recently, I do not believe that this is a good time to try again.  - Spironolactone has been discontinued due to hyperkalemia.  - Restart bisoprolol once out of CHF exacerbation.  - BP running high and unable to take PO right now.  - Will continue IV NTG for another 24 h. 3. CAD:No evidence of acute coronary syndrome, recent cardiac catheterization did not show any high risk/severe stenoses. Medical management is recommended. On statin, aspirin/clopidogrel, beta-blocker (bisoprolol temporarily on hold). 4. VT:She has had multiple appropriate defibrillator discharges since device implantation, of which she has not always been aware. Currently on amiodarone without recent VT.  5. Amiodarone: We will need to review at what point we should decrease her dose of amiodarone to the 200 mg daily dose. Will request Dr. Orvan Falconer. Normal LFTs this admission, high normal TSH last month. Will recheck TSH/free T4. 6. CRT-D:Normal device function with 98% biventricular pacing and evidence of effective LV capture by ECG and recent device check. Continue remote downloads after DC. 7. COPD:Lowers her threshold for respiratory decompensation. Note ABG on admission with pCO2 54 and high normal pH, c/w with chronic hypercapnia and metabolic compensation + hyperventilation on admission due to CHF. She is a "chronci CO2 retainer". Unfortunately she continues to smoke (Quit 2019, restarted, but "cut back").On bisoprolol for its highly selective  beta 1 properties. Add bronchodilators today, will help her cough. 8. WFU:XNATF BP right arm. Hx L subclavian stent. Has abnormal ABI, asymptomatic (sedentary). Previous L carotid endarterectomy. 9. Dyslipidemia:have tried numerous statins, stopped due to  side effects. She declined PCSK9 inh.     For questions or updates, please contact [place] Please consult [email or web address] for contact info under        Signed, [name], MD  12/07/2019, 9:16 AM

## 2019-12-07 NOTE — Plan of Care (Signed)

## 2019-12-07 NOTE — Progress Notes (Signed)
PROGRESS NOTE    Sydney Phillips  UXN:[id number] DOB: 01-19-1936 DOA: 12/05/2019 PCP: Laurey Morale, MD    Chief Complaint  Patient presents with  . Respiratory Distress    Brief Narrative:  84 year old lady prior history of coronary artery disease s/p PCI, ischemic cardiomyopathy chronic systolic heart failure, left ventricular ejection fraction of 20 to 25%, stage IIl CKD, severe COPD on 2 L of nasal cannula oxygen, hypertension, type 2 diabetes, history of VT/VF s/p AICD, peripheral vascular disease, obstructive sleep apnea, hyperlipidemia, [place] to ED with complaints of shortness of breath since Monday.  On arrival to ED patient was lethargic and required BiPAP.  While in ED she was given IV Solu-Medrol and duo nebs and referred to [facility] for admission.  Chest x-ray showed mild CHF.  ABG on admission revealed pH of 7.4, PCO2 53, PO2 of 94.  She was started on IV Lasix and cardiology consulted for further evaluation.  Patient seen and examined this morning, she was weaned off BiPAP and transition to 3 L of nasal cannula oxygen.    Assessment & Plan:   Principal Problem:   Acute on chronic respiratory failure with hypoxia and hypercapnia (HCC) Active Problems:   COPD (chronic obstructive pulmonary disease) (HCC)   PVD,   CAD S/P percutaneous coronary angioplasty   CKD (chronic kidney disease), stage III   Acute on chronic congestive heart failure (HCC)   1 acute on chronic respiratory failure with hypoxia and hypercapnia secondary to acute on chronic combined systolic diastolic heart failure Patient had presented with shortness of breath, in the setting of a history of severe COPD on 2 L nasal cannula, noted EF of 20 to 25%, left ventricle global hypokinesis, grade 2 diastolic dysfunction (2D echo 08/24/2019 )and placed on IV Lasix.  BNP on admission elevated at 1385.6.  Cardiac enzymes minimally elevated but flattened.  Patient initially was requiring BiPAP and has been  transitioned to 3 L nasal cannula.  Patient still volume overloaded on examination.  Chest x-ray done on admission consistent with volume overload.  Patient with urine output of 1.350 L over the past 24 hours.  Patient is -1.3 L during this hospitalization.  Patient on IV Lasix and dose increased per cardiology to 120 mg IV every 12 hours.  Cardiology following and appreciate input and recommendations.  2.  COPD on chronic O2 2 L Stable.  Patient with complaints of a cough and subsequently has been started on Mucinex.  Continue duo nebs as needed.  Place on PPI, Claritin.  3.  History of coronary artery disease status post cath 10/24/2019 Medical management recommended from recent cath.  Continue aspirin, Plavix.  4.  Hypertension Stable.  5.  Chronic kidney disease stage IIIb Stable.  Monitor with diuresis.  6.  History of V. Tach/VF status post AICD Keep magnesium greater than 2.  Keep potassium greater than 4.  Continue amiodarone.  Per cardiology.  7.  History of peripheral vascular disease status post left subclavian stent Continue aspirin, Plavix, statin.  Outpatient follow-up.  8.  Hyperlipidemia Per cardiology patient noted to have been tried on numerous statins which was stopped due to side effects.  Per cardiology patient has declined PCSK9 inhibitor.  Outpatient follow-up.   DVT prophylaxis: Lovenox Code Status: Full Family Communication: Updated patient.  No family at bedside. Disposition:   Status is: Inpatient    Dispo: The patient is from: Home  Anticipated d/c is to: To be determined.              Anticipated d/c date is: Once patient has been adequately diuresed and cleared by cardiology hopefully in the next 3 to 4 days.              Patient currently volume overloaded, on IV Lasix, cardiology following.       Consultants:   Cardiology: Dr. Sallyanne Kuster 12/05/2019  Procedures:   Chest x-ray 12/05/2019    Antimicrobials:    None   Subjective: Patient sitting up in bed.  Feels shortness of breath is slowly improving.  Denies any ongoing chest pain.  Had some complaints of thick cough with sticky sputum to cardiology and states usually improves with Mucinex.  On nitroglycerin drip.  Objective: Vitals:   12/07/19 1000 12/07/19 1100 12/07/19 1148 12/07/19 1357  BP: (!) 130/98 (!) 126/47 107/62   Pulse: 63 (!) 59 60   Resp: 17 17 20    Temp:   97.7 F (36.5 C)   TempSrc:   Oral   SpO2:   96% 97%  Weight:      Height:        Intake/Output Summary (Last 24 hours) at 12/07/2019 1754 Last data filed at 12/07/2019 1048 Gross per 24 hour  Intake 535.15 ml  Output 950 ml  Net -414.85 ml   Filed Weights   12/05/19 1900 12/06/19 0500 12/07/19 0025  Weight: 87 kg 87.9 kg 87.1 kg    Examination:  General exam: Appears calm and comfortable  Respiratory system: Diffuse crackles.  Cardiovascular system: S1 & S2 heard, RRR. No JVD, murmurs, rubs, gallops or clicks. Trace BLE edema. Gastrointestinal system: Abdomen is nondistended, soft and nontender. No organomegaly or masses felt. Normal bowel sounds heard. Central nervous system: Alert and oriented. No focal neurological deficits. Extremities: Symmetric 5 x 5 power. Skin: No rashes, lesions or ulcers Psychiatry: Judgement and insight appear normal. Mood & affect appropriate.     Data Reviewed: I have personally reviewed following labs and imaging studies  CBC: Recent Labs  Lab 12/05/19 1259 12/05/19 1343 12/06/19 0553  WBC 10.1  --  7.6  NEUTROABS 7.4  --   --   HGB 13.0 13.9 12.4  HCT 42.1 41.0 39.9  MCV 105.3*  --  103.6*  PLT 276  --  254    Basic Metabolic Panel: Recent Labs  Lab 12/05/19 1259 12/05/19 1343 12/06/19 0553 12/07/19 0344  NA 143 143 143 140  K 4.6 4.0 3.8 4.1  CL 102  --  100 96*  CO2 29  --  33* 31  GLUCOSE 152*  --  166* 136*  BUN 15  --  19 33*  CREATININE 1.24*  --  1.49* 1.45*  CALCIUM 9.0  --  9.1 9.0   MG 1.9  --   --   --   PHOS 3.1  --   --   --     GFR: Estimated Creatinine Clearance: 33.1 mL/min (A) (by C-G formula based on SCr of 1.45 mg/dL (H)).  Liver Function Tests: Recent Labs  Lab 12/05/19 1259  AST 19  ALT 14  ALKPHOS 97  BILITOT 0.7  PROT 6.4*  ALBUMIN 3.6    CBG: No results for input(s): GLUCAP in the last 168 hours.   Recent Results (from the past 240 hour(s))  Culture, blood (routine x 2)     Status: None (Preliminary result)   Collection Time: 12/05/19 12:59  PM   Specimen: BLOOD RIGHT FOREARM  Result Value Ref Range Status   Specimen Description BLOOD RIGHT FOREARM  Final   Special Requests   Final    BOTTLES DRAWN AEROBIC AND ANAEROBIC Blood Culture adequate volume   Culture   Final    NO GROWTH 2 DAYS Performed at [facility], [address], [place], [place] [address]    Report Status PENDING  Incomplete  SARS Coronavirus 2 by RT PCR (hospital order, performed in [facility] hospital lab) Nasopharyngeal Nasopharyngeal Swab     Status: None   Collection Time: 12/05/19 12:59 PM   Specimen: Nasopharyngeal Swab  Result Value Ref Range Status   SARS Coronavirus 2 NEGATIVE NEGATIVE Final    Comment: (NOTE) SARS-CoV-2 target nucleic acids are NOT DETECTED.  The SARS-CoV-2 RNA is generally detectable in upper and lower respiratory specimens during the acute phase of infection. The lowest concentration of SARS-CoV-2 viral copies this assay can detect is 250 copies / mL. A negative result does not preclude SARS-CoV-2 infection and should not be used as the sole basis for treatment or other patient management decisions.  A negative result may occur with improper specimen collection / handling, submission of specimen other than nasopharyngeal swab, presence of viral mutation(s) within the areas targeted by this assay, and inadequate number of viral copies (<250 copies / mL). A negative result must be combined with clinical observations, patient  history, and epidemiological information.  Fact Sheet for Patients:   StrictlyIdeas.no  Fact Sheet for Healthcare Providers: BankingDealers.co.za  This test is not yet approved or  cleared by the [place] FDA and has been authorized for detection and/or diagnosis of SARS-CoV-2 by FDA under an Emergency Use Authorization (EUA).  This EUA will remain in effect (meaning this test can be used) for the duration of the COVID-19 declaration under Section 564(b)(1) of the Act, 21 U.S.C. section 360bbb-3(b)(1), unless the authorization is terminated or revoked sooner.  Performed at [facility], [place] [address], [place], [place] [address]   Culture, blood (routine x 2)     Status: None (Preliminary result)   Collection Time: 12/05/19  3:06 PM   Specimen: BLOOD RIGHT HAND  Result Value Ref Range Status   Specimen Description BLOOD RIGHT HAND  Final   Special Requests   Final    BOTTLES DRAWN AEROBIC AND ANAEROBIC Blood Culture results may not be optimal due to an inadequate volume of blood received in culture bottles   Culture   Final    NO GROWTH 2 DAYS Performed at [facility], [place] [address], [place], [place] [address]    Report Status PENDING  Incomplete         Radiology Studies: No results found.      Scheduled Meds: . allopurinol  300 mg Oral Daily  . ALPRAZolam  1 mg Oral Daily  . amiodarone  200 mg Oral BID  . aspirin  81 mg Oral Daily  . azelastine  2 spray Each Nare BID  . clopidogrel  75 mg Oral Daily  . DULoxetine  60 mg Oral BID  . enoxaparin (LOVENOX) injection  40 mg Subcutaneous Q24H  . guaiFENesin  1,200 mg Oral BID  . ipratropium-albuterol  3 mL Nebulization Q6H  . loratadine  10 mg Oral Daily  . magnesium oxide  400 mg Oral Daily  . mouth rinse  15 mL Mouth Rinse BID  . pantoprazole  40 mg Oral Q0600  .  sodium chloride flush  3 mL Intravenous Q12H   Continuous Infusions: . sodium  chloride    . furosemide    . nitroGLYCERIN 10 mcg/min (12/06/19 1024)     LOS: 2 days    Time spent: 35 minutes    Irine Seal, MD [facility]   To contact the attending provider between 7A-7P or the covering provider during after hours 7P-7A, please log into the web site [email or web address] and access using universal [place] password for that web site. If you do not have the password, please call the hospital operator.  12/07/2019, 5:54 PM

## 2019-12-07 NOTE — Progress Notes (Addendum)
Physical Therapy Evaluation Patient Details Name: Sydney Phillips MRN: [id number] DOB: [birth year] Today's Date: 12/07/2019   History of Present Illness  84 yo female with onset of SOB and lethargy was brought to hosp, had fallen at home, added fluid retention and become hypoxic.  On O2 at home, but desaturated and in resp failure from CHF.  PMHx:  O2 use, HTN, CKD3, CAD, EF 20-25%, COPD, cataracts, OSA, angioplasty, ICD, DM, tobacco use, PVD, VT  Clinical Impression  Pt was seen after many attempts to work on bed mob, to get to [facility] and to walk a few steps.  Pt is quite weak from being in bed and her many co-morbidities, and will focus on getting her walking and able to assist with enough movement to get home with family.  Will need to assess a bit more detail about the geography of home and the plan for her discharge, as family may prefer to take her directly home.    Follow Up Recommendations SNF;Supervision for mobility/OOB    Equipment Recommendations  None recommended by PT    Recommendations for Other Services       Precautions / Restrictions Precautions Precautions: Fall Precaution Comments: monitor O2 sats Restrictions Weight Bearing Restrictions: No      Mobility  Bed Mobility Overal bed mobility: Needs Assistance Bed Mobility: Supine to Sit;Sit to Supine     Supine to sit: Mod assist Sit to supine: Mod assist      Transfers Overall transfer level: Needs assistance Equipment used: Rolling walker (2 wheeled);1 person hand held assist Transfers: Sit to/from [facility] Sit to Stand: Mod assist Stand pivot transfers: Mod assist       General transfer comment: mod directly assisting to bedside commode and mod to help stand and transfer via walker to bed  Ambulation/Gait Ambulation/Gait assistance: Min assist Gait Distance (Feet): 5 Feet Assistive device: Rolling walker (2 wheeled);1 person hand held assist Gait Pattern/deviations: Step-to  pattern;Decreased stride length;Wide base of support;Trunk flexed Gait velocity: reduced Gait velocity interpretation: <1.31 ft/sec, indicative of household ambulator General Gait Details: weak generally, low endurance for standing  Stairs            Wheelchair Mobility    Modified Rankin (Stroke Patients Only)       Balance Overall balance assessment: History of Falls;Needs assistance Sitting-balance support: Feet supported Sitting balance-Leahy Scale: Fair     Standing balance support: Bilateral upper extremity supported;During functional activity Standing balance-Leahy Scale: Poor                               Pertinent Vitals/Pain Pain Assessment: No/denies pain    Home Living Family/patient expects to be discharged to:: Private residence Living Arrangements: Children Available Help at Discharge: Family;Available 24 hours/day Type of Home: House Home Access: Stairs to enter Entrance Stairs-Rails: Left Entrance Stairs-Number of Steps: 2 Home Layout: One level Home Equipment: Walker - 2 wheels;Cane - single point Additional Comments: pt requires an assistive device athome with O2 needed    Prior Function Level of Independence: Needs assistance   Gait / Transfers Assistance Needed: home with her son, has assistance for mobility           Hand Dominance   Dominant Hand: Right    Extremity/Trunk Assessment   Upper Extremity Assessment Upper Extremity Assessment: Generalized weakness    Lower Extremity Assessment Lower Extremity Assessment: Generalized weakness    Cervical / Trunk  Assessment Cervical / Trunk Assessment: Kyphotic  Communication   Communication: No difficulties  Cognition Arousal/Alertness: Awake/alert Behavior During Therapy: WFL for tasks assessed/performed Overall Cognitive Status: Within Functional Limits for tasks assessed                                        General Comments General  comments (skin integrity, edema, etc.): pt was up to side of bed and BSC with O2 up to 100% with being upright, very low endurance after being in bed several days    Exercises Other Exercises Other Exercises: LE strength was 3+ to 4-   Assessment/Plan    PT Assessment Patient needs continued PT services  PT Problem List Decreased strength;Decreased range of motion;Decreased activity tolerance;Decreased balance;Decreased mobility;Decreased coordination;Decreased knowledge of use of DME;Decreased safety awareness;Cardiopulmonary status limiting activity;Obesity;Decreased skin integrity       PT Treatment Interventions DME instruction;Gait training;Functional mobility training;Therapeutic activities;Therapeutic exercise;Balance training;Neuromuscular re-education;Patient/family education    PT Goals (Current goals can be found in the Care Plan section)  Acute Rehab PT Goals Patient Stated Goal: to get walking again PT Goal Formulation: With patient Time For Goal Achievement: 12/21/19 Potential to Achieve Goals: Good    Frequency Min 3X/week   Barriers to discharge Decreased caregiver support has one son home with her    Co-evaluation               AM-PAC PT "6 Clicks" Mobility  Outcome Measure Help needed turning from your back to your side while in a flat bed without using bedrails?: A Little Help needed moving from lying on your back to sitting on the side of a flat bed without using bedrails?: A Lot Help needed moving to and from a bed to a chair (including a wheelchair)?: A Lot Help needed standing up from a chair using your arms (e.g., wheelchair or bedside chair)?: A Lot Help needed to walk in hospital room?: A Lot Help needed climbing 3-5 steps with a railing? : Total 6 Click Score: 12    End of Session Equipment Utilized During Treatment: Gait belt;Oxygen Activity Tolerance: Patient limited by fatigue;Treatment limited secondary to medical complications  (Comment) Patient left: in bed;with call bell/phone within reach;with bed alarm set;with nursing/sitter in room;with family/visitor present Nurse Communication: Mobility status PT Visit Diagnosis: Unsteadiness on feet (R26.81);Muscle weakness (generalized) (M62.81);History of falling (Z91.81);Adult, failure to thrive (R62.7)    Time: 2683-4196 PT Time Calculation (min) (ACUTE ONLY): 36 min   Charges:   PT Evaluation $PT Eval Moderate Complexity: 1 Mod PT Treatments $Therapeutic Activity: 8-22 mins       Ramond Dial 12/07/2019, 8:17 PM  Mee Hives, PT MS Acute Rehab Dept. Number: [place]

## 2019-12-08 LAB — URINALYSIS, ROUTINE W REFLEX MICROSCOPIC
Bilirubin Urine: NEGATIVE
Glucose, UA: NEGATIVE mg/dL
Hgb urine dipstick: NEGATIVE
Ketones, ur: NEGATIVE mg/dL
Leukocytes,Ua: NEGATIVE
Nitrite: NEGATIVE
Protein, ur: NEGATIVE mg/dL
Specific Gravity, Urine: 1.006 (ref 1.005–1.030)
pH: 7 (ref 5.0–8.0)

## 2019-12-08 LAB — MRSA PCR SCREENING: MRSA by PCR: POSITIVE — AB

## 2019-12-08 LAB — BASIC METABOLIC PANEL
Anion gap: 11 (ref 5–15)
BUN: 33 mg/dL — ABNORMAL HIGH (ref 8–23)
CO2: 33 mmol/L — ABNORMAL HIGH (ref 22–32)
Calcium: 8.9 mg/dL (ref 8.9–10.3)
Chloride: 97 mmol/L — ABNORMAL LOW (ref 98–111)
Creatinine, Ser: 1.69 mg/dL — ABNORMAL HIGH (ref 0.44–1.00)
GFR calc Af Amer: 32 mL/min — ABNORMAL LOW (ref >60)
GFR calc non Af Amer: 28 mL/min — ABNORMAL LOW (ref >60)
Glucose, Bld: 106 mg/dL — ABNORMAL HIGH (ref 70–99)
Potassium: 3.7 mmol/L (ref 3.5–5.1)
Sodium: 141 mmol/L (ref 135–145)

## 2019-12-08 LAB — CBC
HCT: 42.9 % (ref 36.0–46.0)
Hemoglobin: 13.3 g/dL (ref 12.0–15.0)
MCH: 32.4 pg (ref 26.0–34.0)
MCHC: 31 g/dL (ref 30.0–36.0)
MCV: 104.6 fL — ABNORMAL HIGH (ref 80.0–100.0)
Platelets: 268 10*3/uL (ref 150–400)
RBC: 4.1 MIL/uL (ref 3.87–5.11)
RDW: 16.3 % — ABNORMAL HIGH (ref 11.5–15.5)
WBC: 10.8 10*3/uL — ABNORMAL HIGH (ref 4.0–10.5)
nRBC: 0 % (ref 0.0–0.2)

## 2019-12-08 LAB — MAGNESIUM: Magnesium: 2.1 mg/dL (ref 1.7–2.4)

## 2019-12-08 LAB — TSH: TSH: 11.855 u[IU]/mL — ABNORMAL HIGH (ref 0.350–4.500)

## 2019-12-08 LAB — T4, FREE: Free T4: 0.97 ng/dL (ref 0.61–1.12)

## 2019-12-08 MED ORDER — SPIRONOLACTONE 12.5 MG HALF TABLET
12.5000 mg | ORAL_TABLET | Freq: Every day | ORAL | Status: DC
  Administered 2019-12-08 – 2019-12-12 (×5): 12.5 mg via ORAL
  Filled 2019-12-08 (×5): qty 1

## 2019-12-08 MED ORDER — IPRATROPIUM-ALBUTEROL 0.5-2.5 (3) MG/3ML IN SOLN
3.0000 mL | Freq: Three times a day (TID) | RESPIRATORY_TRACT | Status: DC
  Administered 2019-12-08 – 2019-12-10 (×5): 3 mL via RESPIRATORY_TRACT
  Filled 2019-12-08 (×6): qty 3

## 2019-12-08 MED ORDER — ENOXAPARIN SODIUM 30 MG/0.3ML ~~LOC~~ SOLN
30.0000 mg | SUBCUTANEOUS | Status: DC
  Administered 2019-12-09: 30 mg via SUBCUTANEOUS
  Filled 2019-12-08: qty 0.3

## 2019-12-08 MED ORDER — TORSEMIDE 20 MG PO TABS
60.0000 mg | ORAL_TABLET | Freq: Two times a day (BID) | ORAL | Status: DC
  Administered 2019-12-08 – 2019-12-12 (×9): 60 mg via ORAL
  Filled 2019-12-08 (×9): qty 3

## 2019-12-08 MED ORDER — TORSEMIDE 20 MG PO TABS: 40.0000 mg | ORAL_TABLET | Freq: Two times a day (BID) | ORAL | Status: DC

## 2019-12-08 MED ORDER — BISOPROLOL FUMARATE 5 MG PO TABS
5.0000 mg | ORAL_TABLET | Freq: Every day | ORAL | Status: DC
  Administered 2019-12-08 – 2019-12-12 (×5): 5 mg via ORAL
  Filled 2019-12-08 (×5): qty 1

## 2019-12-08 MED ORDER — MUPIROCIN 2 % EX OINT
1.0000 "application " | TOPICAL_OINTMENT | Freq: Two times a day (BID) | CUTANEOUS | Status: DC
  Administered 2019-12-08 – 2019-12-12 (×8): 1 via NASAL
  Filled 2019-12-08: qty 22

## 2019-12-08 MED ORDER — CHLORHEXIDINE GLUCONATE CLOTH 2 % EX PADS
6.0000 | MEDICATED_PAD | Freq: Every day | CUTANEOUS | Status: AC
  Administered 2019-12-08 – 2019-12-12 (×5): 6 via TOPICAL

## 2019-12-08 NOTE — TOC Initial Note (Signed)
Transition of Care [facility]) - Initial/Assessment Note    Patient Details  Name: Sydney Phillips MRN: [id number] Date of Birth: 1935-10-21  [facility]) CM/SW Contact:    Shade Flood, LCSW Phone Number: 12/08/2019, 11:34 AM  Clinical Narrative:                  Pt admitted from home. PT recommending SNF rehab at dc. Attempted to speak with pt today to assess but pt stated she didn't feel like talking. Contacted pt's son, [name], instead. Pt and Sydney Phillips live together. Per Sydney Phillips, his mom would NOT agree to SNF rehab. He states he is fine with her coming home and he is available to help take care of her. Offered HH therapy and he stated that he would be up to visit his mom at 12:30 and he would discuss it with her. Notified Sydney Phillips that RNCM will follow up.  Notified RNCM of above.   Expected Discharge Plan: [place] Barriers to Discharge: Continued Medical Work up   Patient Goals and CMS Choice Patient states their goals for this hospitalization and ongoing recovery are:: "I don't feel like talking right now"      Expected Discharge Plan and Services Expected Discharge Plan: [place] In-house Referral: Clinical Social Work     Living arrangements for the past 2 months: Single Family Home                                      Prior Living Arrangements/Services Living arrangements for the past 2 months: Single Family Home Lives with:: Adult Children Patient language and need for interpreter reviewed:: Yes Do you feel safe going back to the place where you live?: Yes      Need for Family Participation in Patient Care: Yes (Comment) Care giver support system in place?: Yes (comment)   Criminal Activity/Legal Involvement Pertinent to Current Situation/Hospitalization: No - Comment as needed  Activities of Daily Living      Permission Sought/Granted                  Emotional Assessment     Affect (typically observed):  Irritable Orientation: : Oriented to Self, Oriented to Place, Oriented to  Time, Oriented to Situation Alcohol / Substance Use: Not Applicable Psych Involvement: No (comment)  Admission diagnosis:  Acute on chronic congestive heart failure (HCC) [I50.9] Acute on chronic respiratory failure with hypoxia (HCC) [J96.21] Acute on chronic congestive heart failure, unspecified heart failure type ([place]) [I50.9] Patient Active Problem List   Diagnosis Date Noted  . Acute on chronic congestive heart failure ([place]) 12/05/2019  . VT (ventricular tachycardia) ([place]) 10/22/2019  . Type 2 diabetes mellitus with diabetic chronic kidney disease ([place]) 01/05/2019  . Diabetic foot ulcers ([place]) 06/18/2018  . Rhinitis, chronic 09/29/2017  . Hearing loss 09/04/2017  . Perforation of left tympanic membrane 09/04/2017  . Acute exacerbation of CHF (congestive heart failure) ([place]) 07/19/2017  . Chronic combined systolic and diastolic heart failure ([place]) 07/19/2017  . Acute on chronic respiratory failure with hypoxia and hypercapnia ([place]) 07/19/2017  . Poor dentition 04/01/2017  . Tobacco abuse 12/17/2016  . Steroid-induced psychosis, with hallucinations ([place])   . Chronic respiratory failure with hypoxia ([place])   . Acute encephalopathy 05/30/2016  . CKD (chronic kidney disease), stage III 05/30/2016  . Leukocytosis   . Stenosis of left subclavian  artery ([place]) 05/19/2016  . Somnolence   . OSA and COPD overlap syndrome ([place])   . Cardiomyopathy, ischemic   . Ischemic mitral valve regurgitation   . Pulmonary hypertension ([place])   . CAD S/P percutaneous coronary angioplasty   . Altered mental state 02/26/2016  . Arthralgia 10/12/2015  . Left carotid stenosis 11/08/2014  . Iron deficiency anemia 04/17/2014  . Carotid artery disease ([place]) 04/17/2014  . LBBB (left bundle branch block) 04/16/2014  . Diastolic dysfunction 25/18/9842  . NSTEMI (non-ST elevated myocardial infarction) ([place]) 01/20/2014  . Melena  01/20/2014  . Microcytic anemia 01/19/2014  . Colon polyp 03/24/2013  . S/P angioplasty with stent, lt. subclavian 07/31/11 08/01/2011  . Implantable cardioverter-defibrillator (ICD) in situ 07/28/2011  . PVD, 07/28/2011  . Vertigo 07/27/2011  . Hypokalemia 07/27/2011  . SPINAL STENOSIS 01/28/2010  . Chronic constipation 01/14/2010  . Insomnia 01/23/2009  . HIP PAIN, BILATERAL 06/21/2008  . Coronary atherosclerosis 03/30/2008  . Other primary cardiomyopathies 03/30/2008  . Myalgia and myositis 11/26/2007  . Other and unspecified hyperlipidemia 07/28/2007  . WEIGHT GAIN 06/04/2007  . COPD (chronic obstructive pulmonary disease) ([place]) 03/02/2007  . Depression with anxiety 02/25/2007  . Hypertension 02/25/2007  . GERD 02/25/2007  . COLONIC POLYPS, HX OF 02/25/2007   PCP:  Laurey Morale, MD Pharmacy:   [place], [place] - [address] [place] [address] Phone: [phone] Fax: [phone]     Social Determinants of Health (SDOH) Interventions    Readmission Risk Interventions Readmission Risk Prevention Plan 12/08/2019  Transportation Screening Complete  Social Work Consult for [place] Planning/Counseling [place] Not Applicable  Medication Review Press photographer) Complete  Some recent data might be hidden

## 2019-12-08 NOTE — Progress Notes (Addendum)
PROGRESS NOTE    Sydney Phillips  AJG:[id number] DOB: 27-Jul-1935 DOA: 12/05/2019 PCP: Laurey Morale, MD    Chief Complaint  Patient presents with  . Respiratory Distress    Brief Narrative:  84 year old lady prior history of coronary artery disease s/p PCI, ischemic cardiomyopathy chronic systolic heart failure, left ventricular ejection fraction of 20 to 25%, stage IIl CKD, severe COPD on 2 L of nasal cannula oxygen, hypertension, type 2 diabetes, history of VT/VF s/p AICD, peripheral vascular disease, obstructive sleep apnea, hyperlipidemia, [place] to ED with complaints of shortness of breath since Monday.  On arrival to ED patient was lethargic and required BiPAP.  While in ED she was given IV Solu-Medrol and duo nebs and referred to [facility] for admission.  Chest x-ray showed mild CHF.  ABG on admission revealed pH of 7.4, PCO2 53, PO2 of 94.  She was started on IV Lasix and cardiology consulted for further evaluation.  Patient seen and examined this morning, she was weaned off BiPAP and transition to 3 L of nasal cannula oxygen.    Assessment & Plan:   Principal Problem:   Acute on chronic respiratory failure with hypoxia and hypercapnia (HCC) Active Problems:   COPD (chronic obstructive pulmonary disease) (HCC)   PVD,   CAD S/P percutaneous coronary angioplasty   CKD (chronic kidney disease), stage III   Acute on chronic congestive heart failure (HCC)   1 acute on chronic respiratory failure with hypoxia and hypercapnia secondary to acute on chronic combined systolic diastolic heart failure Patient had presented with shortness of breath, in the setting of a history of severe COPD on 2 L nasal cannula, noted EF of 20 to 25%, left ventricle global hypokinesis, grade 2 diastolic dysfunction (2D echo 08/24/2019 )and placed on IV Lasix.  BNP on admission elevated at 1385.6.  Cardiac enzymes minimally elevated but flattened.  Patient initially was requiring BiPAP and has been  transitioned to 3 L nasal cannula.  Patient still volume overloaded on examination.  Chest x-ray done on admission consistent with volume overload.  Patient with urine output of 3 L over the past 24 hours.  Patient is - 4.3 L during this hospitalization.  Patient on IV Lasix and dose increased per cardiology to 120 mg IV every 12 hours.  Bisoprolol and low-dose spironolactone started per cardiology.   2.  COPD on chronic O2 2 L Stable.  Patient with complaints of a cough and subsequently has been started on Mucinex with clinical improvement.  Continue duo nebs as needed PPI, Claritin.  3.  History of coronary artery disease status post cath 10/24/2019 Medical management recommended from recent cath.  Continue aspirin, Plavix.  Bisoprolol and spironolactone resumed per cardiology,  4.  Hypertension Stable.  5.  Chronic kidney disease stage IIIb Stable.  Monitor with diuresis.  6.  History of V. Tach/VF status post AICD Keep magnesium > 2.  Keep potassium greater than 4.  Continue amiodarone.  Per cardiology normal device function with 98% biventricular pacing.  Device readjusted per cardiology due to intermittent loss of LV capture.  Per cardiology.  7.  History of peripheral vascular disease status post left subclavian stent Continue aspirin, Plavix, statin.  Outpatient follow-up.  8.  Hyperlipidemia Per cardiology patient noted to have been tried on numerous statins which was stopped due to side effects.  Per cardiology patient has declined PCSK9 inhibitor.  Outpatient follow-up.   DVT prophylaxis: Lovenox Code Status: Full Family Communication: Updated patient.  No family  at bedside. Disposition:   Status is: Inpatient    Dispo: The patient is from: Home              Anticipated d/c is to: Likely home with home health.              Anticipated d/c date is: Once patient has been adequately diuresed and cleared by cardiology hopefully in the next 1-2 days.              Patient  currently volume overloaded, on IV Lasix, cardiology following.       Consultants:   Cardiology: Dr. Sallyanne Kuster 12/05/2019  Procedures:   Chest x-ray 12/05/2019    Antimicrobials:   None   Subjective: Patient sleeping but arousable.  Shortness of breath improving.  Denies chest pain.  No abdominal pain.  Still on nitroglycerin drip.  States did not sleep too well last night.  Cough improving on Mucinex.    Objective: Vitals:   12/08/19 0300 12/08/19 0346 12/08/19 0800 12/08/19 0900  BP: 137/69 134/66 (!) 143/56 (!) 123/43  Pulse: 65 65 65 (!) 59  Resp: (!) 21 17 (!) 26 18  Temp:  98.6 F (37 C)  98.5 F (36.9 C)  TempSrc:  Oral  Oral  SpO2: 97% 97% 96% 92%  Weight:  83 kg    Height:        Intake/Output Summary (Last 24 hours) at 12/08/2019 0949 Last data filed at 12/08/2019 0901 Gross per 24 hour  Intake 1298.81 ml  Output 3500 ml  Net -2201.19 ml   Filed Weights   12/06/19 0500 12/07/19 0025 12/08/19 0346  Weight: 87.9 kg 87.1 kg 83 kg    Examination:  General exam: NAD. Respiratory system: Diffuse crackles.  No wheezing.  Cardiovascular system: Regular rate rhythm with 2/6 holosystolic murmur in the LLSB.  Trace lower extremity edema.  Gastrointestinal system: Abdomen is soft, nontender, nondistended, positive bowel sounds.  No rebound.  No guarding. Central nervous system: Alert and oriented.  No focal neurological deficits.  Extremities: Symmetric 5 x 5 power. Skin: No rashes, lesions or ulcers Psychiatry: Judgement and insight appear normal. Mood & affect appropriate.     Data Reviewed: I have personally reviewed following labs and imaging studies  CBC: Recent Labs  Lab 12/05/19 1259 12/05/19 1343 12/06/19 0553 12/08/19 0413  WBC 10.1  --  7.6 10.8*  NEUTROABS 7.4  --   --   --   HGB 13.0 13.9 12.4 13.3  HCT 42.1 41.0 39.9 42.9  MCV 105.3*  --  103.6* 104.6*  PLT 276  --  257 809    Basic Metabolic Panel: Recent Labs  Lab  12/05/19 1259 12/05/19 1343 12/06/19 0553 12/07/19 0344 12/08/19 0413  NA 143 143 143 140 141  K 4.6 4.0 3.8 4.1 3.7  CL 102  --  100 96* 97*  CO2 29  --  33* 31 33*  GLUCOSE 152*  --  166* 136* 106*  BUN 15  --  19 33* 33*  CREATININE 1.24*  --  1.49* 1.45* 1.69*  CALCIUM 9.0  --  9.1 9.0 8.9  MG 1.9  --   --   --  2.1  PHOS 3.1  --   --   --   --     GFR: Estimated Creatinine Clearance: 27.8 mL/min (A) (by C-G formula based on SCr of 1.69 mg/dL (H)).  Liver Function Tests: Recent Labs  Lab 12/05/19 1259  AST 19  ALT 14  ALKPHOS 97  BILITOT 0.7  PROT 6.4*  ALBUMIN 3.6    CBG: No results for input(s): GLUCAP in the last 168 hours.   Recent Results (from the past 240 hour(s))  Culture, blood (routine x 2)     Status: None (Preliminary result)   Collection Time: 12/05/19 12:59 PM   Specimen: BLOOD RIGHT FOREARM  Result Value Ref Range Status   Specimen Description BLOOD RIGHT FOREARM  Final   Special Requests   Final    BOTTLES DRAWN AEROBIC AND ANAEROBIC Blood Culture adequate volume   Culture   Final    NO GROWTH 2 DAYS Performed at [facility], [address] [place], [place] [address]    Report Status PENDING  Incomplete  SARS Coronavirus 2 by RT PCR (hospital order, performed in [facility] hospital lab) Nasopharyngeal Nasopharyngeal Swab     Status: None   Collection Time: 12/05/19 12:59 PM   Specimen: Nasopharyngeal Swab  Result Value Ref Range Status   SARS Coronavirus 2 NEGATIVE NEGATIVE Final    Comment: (NOTE) SARS-CoV-2 target nucleic acids are NOT DETECTED.  The SARS-CoV-2 RNA is generally detectable in upper and lower respiratory specimens during the acute phase of infection. The lowest concentration of SARS-CoV-2 viral copies this assay can detect is 250 copies / mL. A negative result does not preclude SARS-CoV-2 infection and should not be used as the sole basis for treatment or other patient management decisions.  A negative result  may occur with improper specimen collection / handling, submission of specimen other than nasopharyngeal swab, presence of viral mutation(s) within the areas targeted by this assay, and inadequate number of viral copies (<250 copies / mL). A negative result must be combined with clinical observations, patient history, and epidemiological information.  Fact Sheet for Patients:   StrictlyIdeas.no  Fact Sheet for Healthcare Providers: BankingDealers.co.za  This test is not yet approved or  cleared by the [place] FDA and has been authorized for detection and/or diagnosis of SARS-CoV-2 by FDA under an Emergency Use Authorization (EUA).  This EUA will remain in effect (meaning this test can be used) for the duration of the COVID-19 declaration under Section 564(b)(1) of the Act, 21 U.S.C. section 360bbb-3(b)(1), unless the authorization is terminated or revoked sooner.  Performed at [facility], [place] [address], [place], [place] [address]   Culture, blood (routine x 2)     Status: None (Preliminary result)   Collection Time: 12/05/19  3:06 PM   Specimen: BLOOD RIGHT HAND  Result Value Ref Range Status   Specimen Description BLOOD RIGHT HAND  Final   Special Requests   Final    BOTTLES DRAWN AEROBIC AND ANAEROBIC Blood Culture results may not be optimal due to an inadequate volume of blood received in culture bottles   Culture   Final    NO GROWTH 2 DAYS Performed at [facility], [place] [address] [place], [place] [address]    Report Status PENDING  Incomplete  MRSA PCR Screening     Status: Abnormal   Collection Time: 12/07/19  9:25 PM   Specimen: Nasal Mucosa; Nasopharyngeal  Result Value Ref Range Status   MRSA by PCR POSITIVE (A) NEGATIVE Final    Comment:        The GeneXpert MRSA Assay (FDA approved for NASAL specimens only), is one component of a comprehensive MRSA colonization surveillance program. It is  not intended to diagnose MRSA infection nor to  guide or monitor treatment for MRSA infections. RESULT CALLED TO, READ BACK BY AND VERIFIED WITH: Gwen Her RN 12/08/19 0421 JDW Performed at [facility], [place] [address] [place], [place] [address]          Radiology Studies: No results found.      Scheduled Meds: . allopurinol  300 mg Oral Daily  . ALPRAZolam  1 mg Oral Daily  . amiodarone  200 mg Oral BID  . aspirin  81 mg Oral Daily  . azelastine  2 spray Each Nare BID  . Chlorhexidine Gluconate Cloth  6 each Topical Q0600  . clopidogrel  75 mg Oral Daily  . DULoxetine  60 mg Oral BID  . enoxaparin (LOVENOX) injection  40 mg Subcutaneous Q24H  . guaiFENesin  1,200 mg Oral BID  . ipratropium-albuterol  3 mL Nebulization TID  . loratadine  10 mg Oral Daily  . magnesium oxide  400 mg Oral Daily  . mouth rinse  15 mL Mouth Rinse BID  . mupirocin ointment  1 application Nasal BID  . pantoprazole  40 mg Oral Q0600  . sodium chloride flush  3 mL Intravenous Q12H   Continuous Infusions: . sodium chloride    . furosemide 120 mg (12/08/19 0901)  . nitroGLYCERIN 10 mcg/min (12/08/19 6045)     LOS: 3 days    Time spent: 35 minutes    Irine Seal, MD [facility]   To contact the attending provider between 7A-7P or the covering provider during after hours 7P-7A, please log into the web site [email or web address] and access using universal [place] password for that web site. If you do not have the password, please call the hospital operator.  12/08/2019, 9:49 AM

## 2019-12-08 NOTE — Plan of Care (Signed)

## 2019-12-08 NOTE — Plan of Care (Signed)
  Problem: Education: Goal: Knowledge of General Education information will improve Description: Including pain rating scale, medication(s)/side effects and non-pharmacologic comfort measures Outcome: Progressing   Problem: Health Behavior/Discharge Planning: Goal: Ability to manage health-related needs will improve Outcome: Progressing   Problem: Clinical Measurements: Goal: Respiratory complications will improve Outcome: Progressing   

## 2019-12-08 NOTE — TOC Initial Note (Signed)
Transition of Care [facility]) - Initial/Assessment Note    Patient Details  Name: Sydney Phillips MRN: [id number] Date of Birth: 06-Mar-1936  Transition of Care [facility]) CM/SW Contact:    Zenon Mayo, RN Phone Number: 12/08/2019, 3:20 PM  Clinical Narrative:                 NCM spoke with patient at the bedside, she states her son states with her, he is there 24 hrs /day.  She has walker x 2 at home, cane, and bsc and home oxygen with [employer] (uses at night).  She has transportation at discharge. She has no issues getting her medications.    Expected Discharge Plan: [place] Barriers to Discharge: No Barriers Identified   Patient Goals and CMS Choice Patient states their goals for this hospitalization and ongoing recovery are:: to be able to get up CMS Medicare.gov Compare Post Acute Care list provided to:: Patient Represenative (must comment) Choice offered to / list presented to : Adult Children  Expected Discharge Plan and Services Expected Discharge Plan: [place] In-house Referral: NA Discharge Planning Services: CM Consult Post Acute Care Choice: [place] arrangements for the past 2 months: Single Family Home                   DME Agency: NA       HH Arranged: RN, Disease Management, PT [place] Agency: [place] Date HH Agency Contacted: 12/08/19 Time HH Agency Contacted: 1519 Representative spoke with at [place]: [place] Arrangements/Services Living arrangements for the past 2 months: [place] with:: Adult Children Patient language and need for interpreter reviewed:: Yes Do you feel safe going back to the place where you live?: Yes      Need for Family Participation in Patient Care: Yes (Comment) Care giver support system in place?: Yes (comment) Current home services: DME (walker x 2, cane and bsc, oxygen with [employer] at night) Criminal Activity/Legal Involvement  Pertinent to Current Situation/Hospitalization: No - Comment as needed  Activities of Daily Living      Permission Sought/Granted                  Emotional Assessment Appearance:: Appears stated age Attitude/Demeanor/Rapport: Engaged Affect (typically observed): Appropriate Orientation: : Oriented to Self, Oriented to Place, Oriented to  Time, Oriented to Situation Alcohol / Substance Use: Not Applicable Psych Involvement: No (comment)  Admission diagnosis:  Acute on chronic congestive heart failure (HCC) [I50.9] Acute on chronic respiratory failure with hypoxia (HCC) [J96.21] Acute on chronic congestive heart failure, unspecified heart failure type ([place]) [I50.9] Patient Active Problem List   Diagnosis Date Noted  . Acute on chronic congestive heart failure ([place]) 12/05/2019  . VT (ventricular tachycardia) ([place]) 10/22/2019  . Type 2 diabetes mellitus with diabetic chronic kidney disease ([place]) 01/05/2019  . Diabetic foot ulcers ([place]) 06/18/2018  . Rhinitis, chronic 09/29/2017  . Hearing loss 09/04/2017  . Perforation of left tympanic membrane 09/04/2017  . Acute exacerbation of CHF (congestive heart failure) ([place]) 07/19/2017  . Chronic combined systolic and diastolic heart failure ([place]) 07/19/2017  . Acute on chronic respiratory failure with hypoxia and hypercapnia ([place]) 07/19/2017  . Poor dentition 04/01/2017  . Tobacco abuse 12/17/2016  . Steroid-induced psychosis, with hallucinations ([place])   . Chronic respiratory failure with hypoxia ([place])   . Acute encephalopathy 05/30/2016  . CKD (chronic kidney disease), stage III 05/30/2016  .  Leukocytosis   . Stenosis of left subclavian artery ([place]) 05/19/2016  . Somnolence   . OSA and COPD overlap syndrome ([place])   . Cardiomyopathy, ischemic   . Ischemic mitral valve regurgitation   . Pulmonary hypertension ([place])   . CAD S/P percutaneous coronary angioplasty   . Altered mental state 02/26/2016  . Arthralgia 10/12/2015  .  Left carotid stenosis 11/08/2014  . Iron deficiency anemia 04/17/2014  . Carotid artery disease ([place]) 04/17/2014  . LBBB (left bundle branch block) 04/16/2014  . Diastolic dysfunction 74/05/8785  . NSTEMI (non-ST elevated myocardial infarction) ([place]) 01/20/2014  . Melena 01/20/2014  . Microcytic anemia 01/19/2014  . Colon polyp 03/24/2013  . S/P angioplasty with stent, lt. subclavian 07/31/11 08/01/2011  . Implantable cardioverter-defibrillator (ICD) in situ 07/28/2011  . PVD, 07/28/2011  . Vertigo 07/27/2011  . Hypokalemia 07/27/2011  . SPINAL STENOSIS 01/28/2010  . Chronic constipation 01/14/2010  . Insomnia 01/23/2009  . HIP PAIN, BILATERAL 06/21/2008  . Coronary atherosclerosis 03/30/2008  . Other primary cardiomyopathies 03/30/2008  . Myalgia and myositis 11/26/2007  . Other and unspecified hyperlipidemia 07/28/2007  . WEIGHT GAIN 06/04/2007  . COPD (chronic obstructive pulmonary disease) ([place]) 03/02/2007  . Depression with anxiety 02/25/2007  . Hypertension 02/25/2007  . GERD 02/25/2007  . COLONIC POLYPS, HX OF 02/25/2007   PCP:  Laurey Morale, MD Pharmacy:   [place], [place] - [address] [place] [address] Phone: [phone] Fax: [phone]     Social Determinants of Health (SDOH) Interventions    Readmission Risk Interventions Readmission Risk Prevention Plan 12/08/2019 12/08/2019  Transportation Screening Complete Complete  PCP or Specialist Appt within 3-5 Days Complete -  HRI or Home Care Consult Complete -  Social Work Consult for [place] Planning/Counseling Complete Complete  Palliative Care Screening Not Applicable Not Applicable  Medication Review Press photographer) Complete Complete  Some recent data might be hidden

## 2019-12-08 NOTE — Progress Notes (Signed)
Patient refused medication for the night as well as Bipap.  Patient has refused for three consecutive nights, will pull Bipap out of room in the morning.  No distress noted at this time, patient on [place].

## 2019-12-08 NOTE — Progress Notes (Signed)
Patient did well over night, no complaints of chest pain.

## 2019-12-08 NOTE — Progress Notes (Signed)
Progress Note  Patient Name: Sydney Phillips Date of Encounter: 12/08/2019  [facility] Cardiologist: [name], MD   Subjective   Diuresed well, breathing almost back to baseline, coughing less. No edema. Weight 183 lb (target <180). Net diuresis 3 liters since admission. Creat slightly higher than yesterday, but I think this is her baseline. Noticed frequent variation in paced QRS complex duration on monitor, suggesting frequent ineffective resynchronization.  Inpatient Medications    Scheduled Meds: . allopurinol  300 mg Oral Daily  . ALPRAZolam  1 mg Oral Daily  . amiodarone  200 mg Oral BID  . aspirin  81 mg Oral Daily  . azelastine  2 spray Each Nare BID  . Chlorhexidine Gluconate Cloth  6 each Topical Q0600  . clopidogrel  75 mg Oral Daily  . DULoxetine  60 mg Oral BID  . enoxaparin (LOVENOX) injection  40 mg Subcutaneous Q24H  . guaiFENesin  1,200 mg Oral BID  . ipratropium-albuterol  3 mL Nebulization TID  . loratadine  10 mg Oral Daily  . magnesium oxide  400 mg Oral Daily  . mouth rinse  15 mL Mouth Rinse BID  . mupirocin ointment  1 application Nasal BID  . pantoprazole  40 mg Oral Q0600  . sodium chloride flush  3 mL Intravenous Q12H   Continuous Infusions: . sodium chloride    . furosemide 120 mg (12/08/19 0901)  . nitroGLYCERIN 10 mcg/min (12/08/19 0619)   PRN Meds: sodium chloride, ALPRAZolam, ipratropium-albuterol, ondansetron (ZOFRAN) IV, sodium chloride flush   Vital Signs    Vitals:   12/08/19 0300 12/08/19 0346 12/08/19 0800 12/08/19 0900  BP: 137/69 134/66 (!) 143/56 (!) 123/43  Pulse: 65 65 65 (!) 59  Resp: (!) 21 17 (!) 26 18  Temp:  98.6 F (37 C)  98.5 F (36.9 C)  TempSrc:  Oral  Oral  SpO2: 97% 97% 96% 92%  Weight:  83 kg    Height:        Intake/Output Summary (Last 24 hours) at 12/08/2019 0929 Last data filed at 12/08/2019 8295 Gross per 24 hour  Intake 1098.81 ml  Output 3500 ml  Net -2401.19 ml   Last 3  Weights 12/08/2019 12/07/2019 12/06/2019  Weight (lbs) 183 lb 192 lb 1.6 oz 193 lb 12.6 oz  Weight (kg) 83.008 kg 87.136 kg 87.9 kg      Telemetry    A sensed (sinus), V paced (varying QRS width) - Personally Reviewed  ECG    No new tracing - Personally Reviewed  Physical Exam  Appears well GEN: No acute distress.   Neck: 5 cm JVD Cardiac: RRR, early peaking Ao ejection murmur, 2/6 holosystolic LLSB murmur, no diastolic murmurs, rubs, or gallops.  Respiratory: Clear to auscultation bilaterally. GI: Soft, nontender, non-distended  MS: No edema; No deformity. Neuro:  Nonfocal  Psych: Normal affect   Labs    High Sensitivity Troponin:   Recent Labs  Lab 12/05/19 1259 12/05/19 1545  TROPONINIHS 22* 20*      Chemistry Recent Labs  Lab 12/05/19 1259 12/05/19 1343 12/06/19 0553 12/07/19 0344 12/08/19 0413  NA 143   < > 143 140 141  K 4.6   < > 3.8 4.1 3.7  CL 102   < > 100 96* 97*  CO2 29   < > 33* 31 33*  GLUCOSE 152*   < > 166* 136* 106*  BUN 15   < > 19 33* 33*  CREATININE 1.24*   < >  1.49* 1.45* 1.69*  CALCIUM 9.0   < > 9.1 9.0 8.9  PROT 6.4*  --   --   --   --   ALBUMIN 3.6  --   --   --   --   AST 19  --   --   --   --   ALT 14  --   --   --   --   ALKPHOS 97  --   --   --   --   BILITOT 0.7  --   --   --   --   GFRNONAA 41*   < > 33* 34* 28*  GFRAA 47*   < > 38* 39* 32*  ANIONGAP 12   < > 10 13 11    < > = values in this interval not displayed.     Hematology Recent Labs  Lab 12/05/19 1259 12/05/19 1259 12/05/19 1343 12/06/19 0553 12/08/19 0413  WBC 10.1  --   --  7.6 10.8*  RBC 4.00  --   --  3.85* 4.10  HGB 13.0   < > 13.9 12.4 13.3  HCT 42.1   < > 41.0 39.9 42.9  MCV 105.3*  --   --  103.6* 104.6*  MCH 32.5  --   --  32.2 32.4  MCHC 30.9  --   --  31.1 31.0  RDW 16.7*  --   --  16.5* 16.3*  PLT 276  --   --  257 268   < > = values in this interval not displayed.    BNP Recent Labs  Lab 12/05/19 1259  BNP 1,385.6*     DDimer No  results for input(s): DDIMER in the last 168 hours.   Radiology    No results found.  Cardiac Studies   Device in office check Nov 26, 2019 CRT-D device check in office. Thresholds and sensing consistent with previous device measurements. Lead impedance trends stable over time. No mode switch episodes recorded. Known VF with shock, was admitted at the time. Patient bi-ventricularly pacing  98.76% of the time. Device programmed with appropriate safety margins. Heart failure diagnostics reviewed and trends are stable for patient. No changes made this session. Estimated longevity 1 yr, 5 mo. Patient enrolled in remote follow up. Next  11/09/2019. RTC 4 weeks for torsemide adjustment prn.  Left heart catheterization 10/24/2019  Moderate three-vessel coronary disease.  30% ostial left main.  60% ostial LAD (unchanged from 2017) with moderate mid LAD plaque and calcification.  Patent proximal to mid circumflex with diffuse 50% in-stent restenosis. Ostial circumflex contains 30 to 40% narrowing.  RCA contains up to 50% diffuse in-stent restenosis. Proximal to the stented segment is an eccentric 70% region of narrowing  Elevated LVEDP, 31 mmHg suggesting decompensated acute on chronic combined systolic heart failure.  Catheterization from right radial was compromised by difficulty with catheter advancement, most likely due to selection of the radial recurrent route to the subclavian artery requiring that catheter size be downgraded to 4 [place].  Recommendations:   No high-grade thrombotic appearing lesions noted. Three-vessel disease as noted above.  Coronary angiography/PCI attempt in the future should not be via the right radial approach.  Guideline directed therapy for acute on chronic combined systolic and diastolic heart failure. Consider advanced heart failure team consult.  Echocardiogram3/03/2020 1. Left ventricular ejection fraction, by estimation, is 20 to 25%. The    left ventricle has severely decreased function. The left ventricle  demonstrates global hypokinesis.  The left ventricular internal cavity size  was mildly dilated. Left ventricular  diastolic parameters are consistent with Grade II diastolic dysfunction  (pseudonormalization).  2. Right ventricular systolic function is normal. The right ventricular  size is normal. There is moderately elevated pulmonary artery systolic  pressure.  3. The mitral valve is normal in structure. Moderate mitral valve  regurgitation.  4. Tricuspid valve regurgitation is mild to moderate.  5. The aortic valve is normal in structure. Aortic valve regurgitation is  trivial. No aortic stenosis is present.   Comparison(s): 07/20/17 EF 35-40%.   Patient Profile     85 y.o. female with a hx of CHF, CAD, VT, CRT-D, COPDwhopresented with acute hypoxic respiratory failure due toCHF exacerbation with respiratory failure   Assessment & Plan    1. Acute respiratory failure with hypoxemia and hypercapnia/pulmonary edema:resolved. Labs c/w chronic CO2 retention.  2. Acute on chronic combined systolic and diastolic heart failure:We need to reestablish her "dry weight", but definitely no more than 180 lb. Management of heart failure has become more complicated with worsening underlying renal function, we will need to tolerate higher BUN/creat levels. - switch to PO diuretic after current IV dose this AM. Over last month, torsemide 80 mg BID seemed to be too much, but 40 mg BID not enough. Will try 60 mg BID. -She has been intolerant of RAAS inhibitors in the past due to orthostaticsyncope/hypotensionwith falls(she reports 3 falls at home in the last 3 weeks, although she denies true syncope).With the volatility that her renal function has shown recently, I do notbelievethat this is a good time to try again. -Spironolactone has been discontinued due to hyperkalemia, but she also had AKI at the time. Will  try a smaller dose.  - Restart bisoprolol. -DC iv NTGIV. 3. CAD:No evidence of acute coronary syndrome, recent cardiac catheterization did not show any high risk/severe stenoses. Medical management is recommended. On statin, aspirin/clopidogrel, beta-blocker(bisoprolol temporarily on hold). 4. VT:She has had multiple appropriate defibrillator discharges since device implantation, of which she has not always been aware. Currently on amiodarone without recent VT.  5. Amiodarone: We will need to review atwhatpoint we should decrease her dose of amiodarone to the 200 mg daily dose. Will request Dr. Orvan Falconer. Normal LFTs this admission. TSH is high, but free T4 is still normal. Might represent sick euthyroid syndrome, but should recheck in a month. 6. CRT-D:Normal device function with 98% biventricular pacing. Device readjusted (LV lead output increased) due to intermittent loss of LV capture. 7. COPD:Lowers her threshold for respiratory decompensation. Note ABG on admission with pCO2 54 and high normal pH, c/w with chronic hypercapnia and metabolic compensation + hyperventilation on admission due to CHF. She is a "chronic CO2 retainer". Unfortunately she continues to smoke (Quit 2019, restarted, but "cut back").On bisoprolol for its highly selective beta 1 properties.  8. TML:YYTKP BP right arm. Hx L subclavian stent. Has abnormal ABI, asymptomatic (sedentary). Previous L carotid endarterectomy. 9. Dyslipidemia:have tried numerous statins, stopped due to side effects. She declined PCSK9 inh.     For questions or updates, please contact [place] Please consult [email or web address] for contact info under        Signed, [name], MD  12/08/2019, 9:29 AM

## 2019-12-09 LAB — BASIC METABOLIC PANEL
Anion gap: 13 (ref 5–15)
BUN: 33 mg/dL — ABNORMAL HIGH (ref 8–23)
CO2: 32 mmol/L (ref 22–32)
Calcium: 9.1 mg/dL (ref 8.9–10.3)
Chloride: 94 mmol/L — ABNORMAL LOW (ref 98–111)
Creatinine, Ser: 1.33 mg/dL — ABNORMAL HIGH (ref 0.44–1.00)
GFR calc Af Amer: 43 mL/min — ABNORMAL LOW (ref >60)
GFR calc non Af Amer: 37 mL/min — ABNORMAL LOW (ref >60)
Glucose, Bld: 149 mg/dL — ABNORMAL HIGH (ref 70–99)
Potassium: 3.9 mmol/L (ref 3.5–5.1)
Sodium: 139 mmol/L (ref 135–145)

## 2019-12-09 LAB — MAGNESIUM: Magnesium: 2.1 mg/dL (ref 1.7–2.4)

## 2019-12-09 MED ORDER — TORSEMIDE 20 MG PO TABS
60.0000 mg | ORAL_TABLET | Freq: Two times a day (BID) | ORAL | 1 refills | Status: DC
Start: 2019-12-09 — End: 2019-12-21

## 2019-12-09 MED ORDER — MUPIROCIN 2 % EX OINT: 1.0000 "application " | TOPICAL_OINTMENT | Freq: Two times a day (BID) | CUTANEOUS | 0 refills | Status: AC

## 2019-12-09 MED ORDER — SPIRONOLACTONE 25 MG PO TABS: 12.5000 mg | ORAL_TABLET | Freq: Every day | ORAL | 1 refills | Status: DC

## 2019-12-09 MED ORDER — ENOXAPARIN SODIUM 40 MG/0.4ML ~~LOC~~ SOLN
40.0000 mg | SUBCUTANEOUS | Status: DC
  Administered 2019-12-10 – 2019-12-12 (×3): 40 mg via SUBCUTANEOUS
  Filled 2019-12-09 (×3): qty 0.4

## 2019-12-09 MED ORDER — GUAIFENESIN ER 600 MG PO TB12: 1200.0000 mg | ORAL_TABLET | Freq: Two times a day (BID) | ORAL | 0 refills | Status: DC | PRN

## 2019-12-09 MED ORDER — PANTOPRAZOLE SODIUM 40 MG PO TBEC: 40.0000 mg | DELAYED_RELEASE_TABLET | Freq: Every day | ORAL | 1 refills | Status: DC

## 2019-12-09 MED ORDER — BISOPROLOL FUMARATE 5 MG PO TABS: 5.0000 mg | ORAL_TABLET | Freq: Every day | ORAL | 1 refills | Status: AC

## 2019-12-09 NOTE — Progress Notes (Signed)
Pt refused Bipap for night time use. Pt on 4L oxygen in no distress. RT will continue to monitor and be available if needed.

## 2019-12-09 NOTE — NC FL2 (Signed)
[place] MEDICAID FL2 LEVEL OF CARE SCREENING TOOL     IDENTIFICATION  Patient Name: Sydney [place] Birthdate: Jun 29, 1935 Sex: female Admission Date (Current Location): 12/05/2019  [facility] and [place] Number:  Herbalist and Address:  [facility] [place], [place] [address] [place], [place] [address]      Provider Number: [id number]  Attending Physician Name and Address:  Eugenie Filler, MD  Relative Name and Phone Number:       Current Level of Care: Hospital Recommended Level of Care: [place] Prior Approval Number:    Date Approved/Denied:   PASRR Number: [id number]  Discharge Plan: SNF    Current Diagnoses: Patient Active Problem List   Diagnosis Date Noted  . Acute on chronic congestive heart failure ([place]) 12/05/2019  . V-tach ([place]) 10/22/2019  . Type 2 diabetes mellitus with diabetic chronic kidney disease ([place]) 01/05/2019  . Diabetic foot ulcers ([place]) 06/18/2018  . Rhinitis, chronic 09/29/2017  . Hearing loss 09/04/2017  . Perforation of left tympanic membrane 09/04/2017  . Acute exacerbation of CHF (congestive heart failure) ([place]) 07/19/2017  . Chronic combined systolic and diastolic heart failure ([place]) 07/19/2017  . Acute on chronic respiratory failure with hypoxia and hypercapnia ([place]) 07/19/2017  . Poor dentition 04/01/2017  . Tobacco abuse 12/17/2016  . Steroid-induced psychosis, with hallucinations ([place])   . Chronic respiratory failure with hypoxia ([place])   . Acute encephalopathy 05/30/2016  . CKD (chronic kidney disease), stage III 05/30/2016  . Leukocytosis   . Stenosis of left subclavian artery ([place]) 05/19/2016  . Somnolence   . OSA and COPD overlap syndrome ([place])   . Cardiomyopathy, ischemic   . Ischemic mitral valve regurgitation   . Pulmonary hypertension ([place])   . CAD S/P percutaneous coronary angioplasty   . Altered mental state 02/26/2016  . Arthralgia 10/12/2015  . Left carotid stenosis  11/08/2014  . Iron deficiency anemia 04/17/2014  . Carotid artery disease ([place]) 04/17/2014  . LBBB (left bundle branch block) 04/16/2014  . Diastolic dysfunction 48/18/5631  . NSTEMI (non-ST elevated myocardial infarction) ([place]) 01/20/2014  . Melena 01/20/2014  . Microcytic anemia 01/19/2014  . Colon polyp 03/24/2013  . S/P angioplasty with stent, lt. subclavian 07/31/11 08/01/2011  . Implantable cardioverter-defibrillator (ICD) in situ 07/28/2011  . PVD, 07/28/2011  . Vertigo 07/27/2011  . Hypokalemia 07/27/2011  . SPINAL STENOSIS 01/28/2010  . Chronic constipation 01/14/2010  . Insomnia 01/23/2009  . HIP PAIN, BILATERAL 06/21/2008  . Coronary atherosclerosis 03/30/2008  . Other primary cardiomyopathies 03/30/2008  . Myalgia and myositis 11/26/2007  . Other and unspecified hyperlipidemia 07/28/2007  . WEIGHT GAIN 06/04/2007  . COPD (chronic obstructive pulmonary disease) ([place]) 03/02/2007  . Depression with anxiety 02/25/2007  . Hypertension 02/25/2007  . GERD 02/25/2007  . COLONIC POLYPS, HX OF 02/25/2007    Orientation RESPIRATION BLADDER Height & Weight     Self, Time, Situation, Place  O2 (see dc summary) Incontinent Weight: 179 lb 6.4 oz (81.4 kg) Height:  5\' 5"  (165.1 cm)  BEHAVIORAL SYMPTOMS/MOOD NEUROLOGICAL BOWEL NUTRITION STATUS      Continent Diet (see dc summary)  AMBULATORY STATUS COMMUNICATION OF NEEDS Skin   Extensive Assist Verbally Normal                       Personal Care Assistance Level of Assistance  Bathing, Feeding, Dressing Bathing Assistance: Limited assistance Feeding assistance: Independent Dressing Assistance: Limited assistance     Functional Limitations  Info  Sight, Hearing, Speech Sight Info: Adequate Hearing Info: Adequate Speech Info: Adequate    SPECIAL CARE FACTORS FREQUENCY  PT (By licensed PT), OT (By licensed OT)     PT Frequency: 5x week OT Frequency: 3x week            Contractures Contractures Info: Not  present    Additional Factors Info  Psychotropic     Psychotropic Info: Xanax, Cymbalta         Current Medications (12/09/2019):  This is the current hospital active medication list Current Facility-Administered Medications  Medication Dose Route Frequency Provider Last Rate Last Admin  . 0.9 %  sodium chloride infusion  250 mL Intravenous PRN Tamala Julian, Rondell A, MD      . allopurinol (ZYLOPRIM) tablet 300 mg  300 mg Oral Daily Tamala Julian, Rondell A, MD   300 mg at 12/09/19 0814  . ALPRAZolam Duanne Moron) tablet 1 mg  1 mg Oral Daily Fuller Plan A, MD   1 mg at 12/09/19 3710  . ALPRAZolam Duanne Moron) tablet 1.5 mg  1.5 mg Oral QHS PRN Fuller Plan A, MD   1.5 mg at 12/08/19 2131  . amiodarone (PACERONE) tablet 200 mg  200 mg Oral BID Fuller Plan A, MD   200 mg at 12/09/19 0814  . aspirin chewable tablet 81 mg  81 mg Oral Daily Croitoru, Mihai, MD   81 mg at 12/09/19 0814  . azelastine (ASTELIN) 0.1 % nasal spray 2 spray  2 spray Each Nare BID Norval Morton, MD   2 spray at 12/09/19 [id number]  . bisoprolol (ZEBETA) tablet 5 mg  5 mg Oral Daily Croitoru, Mihai, MD   5 mg at 12/09/19 0813  . Chlorhexidine Gluconate Cloth 2 % PADS 6 each  6 each Topical Q0600 Eugenie Filler, MD   6 each at 12/09/19 0503  . clopidogrel (PLAVIX) tablet 75 mg  75 mg Oral Daily Croitoru, Mihai, MD   75 mg at 12/09/19 0813  . DULoxetine (CYMBALTA) DR capsule 60 mg  60 mg Oral BID Fuller Plan A, MD   60 mg at 12/09/19 0814  . [START ON 12/10/2019] enoxaparin (LOVENOX) injection 40 mg  40 mg Subcutaneous Q24H Eugenie Filler, MD      . guaiFENesin [facility]) 12 hr tablet 1,200 mg  1,200 mg Oral BID Eugenie Filler, MD   1,200 mg at 12/09/19 4854  . ipratropium-albuterol (DUONEB) 0.5-2.5 (3) MG/3ML nebulizer solution 3 mL  3 mL Nebulization Q6H PRN Hosie Poisson, MD      . ipratropium-albuterol (DUONEB) 0.5-2.5 (3) MG/3ML nebulizer solution 3 mL  3 mL Nebulization TID Eugenie Filler, MD   3 mL at 12/09/19 1354   . loratadine (CLARITIN) tablet 10 mg  10 mg Oral Daily Eugenie Filler, MD   10 mg at 12/09/19 0813  . magnesium oxide (MAG-OX) tablet 400 mg  400 mg Oral Daily Fuller Plan A, MD   400 mg at 12/09/19 0813  . MEDLINE mouth rinse  15 mL Mouth Rinse BID Eugenie Filler, MD   15 mL at 12/09/19 0815  . mupirocin ointment (BACTROBAN) 2 % 1 application  1 application Nasal BID Eugenie Filler, MD   1 application at 62/70/35 [phone]  . ondansetron (ZOFRAN) injection 4 mg  4 mg Intravenous Q6H PRN Smith, Rondell A, MD      . pantoprazole (PROTONIX) EC tablet 40 mg  40 mg Oral Q0600 Eugenie Filler, MD   40  mg at 12/09/19 0503  . sodium chloride flush (NS) 0.9 % injection 3 mL  3 mL Intravenous Q12H Smith, Rondell A, MD   3 mL at 12/09/19 0815  . sodium chloride flush (NS) 0.9 % injection 3 mL  3 mL Intravenous PRN Fuller Plan A, MD   3 mL at 12/05/19 1454  . spironolactone (ALDACTONE) tablet 12.5 mg  12.5 mg Oral Daily Croitoru, Mihai, MD   12.5 mg at 12/09/19 0813  . torsemide (DEMADEX) tablet 60 mg  60 mg Oral BID Croitoru, Mihai, MD   60 mg at 12/09/19 0815     Discharge Medications: Please see discharge summary for a list of discharge medications.  Relevant Imaging Results:  Relevant Lab Results:   Additional Information SSN: [id number] [place]

## 2019-12-09 NOTE — Progress Notes (Signed)
Patient was to be discharged home however patient was reassessed by PT and patient noted to be a 2 person max assist.  PT recommending SNF.  Son in agreement with SNF placement as he states unable to manage her at home in her current state.  Will discontinue discharge order.  Consult with social work for SNF placement.  No charge

## 2019-12-09 NOTE — Discharge Summary (Signed)
Physician Discharge Summary  Sydney Phillips HEN:[id number] DOB: [birth year] DOA: 12/05/2019  PCP: Laurey Morale, MD  Admit date: 12/05/2019 Discharge date: 12/09/2019  Time spent: 55 minutes  Recommendations for Outpatient Follow-up:  1. Follow-up with Laurey Morale, MD on 12/16/2019 at 11 AM.  On follow-up patient will need a basic metabolic profile done to follow-up on electrolytes and renal function.  Patient will need TSH and free T4 done in 1 to 2 months. 2. Follow-up with Dr. Sallyanne Kuster, cardiology in 1 to 2 weeks.  On follow-up patient will need a basic metabolic profile as well as a BNP done to follow-up on electrolytes, renal function and CHF. 3. Patient will be discharged home with home health.   Discharge Diagnoses:  Principal Problem:   Acute on chronic respiratory failure with hypoxia and hypercapnia (HCC) Active Problems:   COPD (chronic obstructive pulmonary disease) (HCC)   PVD,   CAD S/P percutaneous coronary angioplasty   CKD (chronic kidney disease), stage III   V-tach (HCC)   Acute on chronic congestive heart failure ([place])   Discharge Condition: Stable and improved  Diet recommendation: Heart healthy  Filed Weights   12/07/19 0025 12/08/19 0346 12/09/19 0306  Weight: 87.1 kg 83 kg 81.4 kg    History of present illness:  HPI per Dr. Trilby Drummer is a 84 y.o. female with medical history significant of CAD s/p PCI, ischemic CM, PAD, HFrEF (20-25%), CKD II, severe COPD on 2 L of oxygen, HTN, DM2, prior VT/VF s/p ICD, PVD, OSA, HLD, tobacco abuse, and PAH who presents with complaints of shortness of breath which started yesterday.  History obtained from the patient's son is present at bedside as patient currently on BiPAP and lethargic.  He reports that the patient fell two nights ago, but said that she did not hurt her self at that time.  However, yesterday patient was noted to be more pale in color and breathing harder than normal.  Her baseline weight  is supposed to be around 179 pounds, but her weight recently was up to 188.  He reports that she has been taking torsemide 60 mg twice daily as prescribed and took an additional dose yesterday due to her increase in weight.  Last night she she said that she is going to sleep, but son is unsure that she ever did.  She was asking for something to drink this morning and when her son gave it to her she got choked up and vomited the rest of it up.  Upon EMS arrival patient was noted to have O2 saturations of 77% on her home 2 L of oxygen.  Patient's son notes that she sometimes has a hard time keeping the oxygen in her nose.  Patient was placed on CPAP, given 125 mg of Solu-Medrol, and 2 DuoNeb's prior to arrival with improvement of O2 saturations to 94%.  Patient OptiVol has been noted to show signs of volume overload about 1 week ago.  ED Course: On admission into the emergency department patient was noted to be afebrile with respirations 27, and O2 saturation maintained on BiPAP.  ABG revealed pH 7.432, PCO2 53.9, and PO2 94.  Labs revealed WBC 10.1, BUN 13, creatinine 1.24, troponin 22, lactic acid 1.8, and BNP was pending.  Chest x-ray was suggestive of mild CHF.  Patient was given 60 mg of furosemide IV.  TRH called to admit  Hospital Course:  1 acute on chronic respiratory failure with hypoxia and hypercapnia secondary  to acute on chronic combined systolic diastolic heart failure Patient had presented with shortness of breath, in the setting of a history of severe COPD on 2 L nasal cannula, noted EF of 20 to 25%, left ventricle global hypokinesis, grade 2 diastolic dysfunction (2D echo 08/24/2019 )and placed on IV Lasix.  BNP on admission elevated at 1385.6.  Cardiac enzymes minimally elevated but flattened.  Patient initially was requiring BiPAP and has been transitioned to 3 L nasal cannula.  Patient was seen by cardiology during the hospitalization and followed by cardiology.  Patient was diuresed with IV  Lasix.  Chest x-ray done on admission consistent with volume overload.  Patient was -6.180 L during the hospitalization.  Discharge was 179.4 pounds from as high as 193.78 pounds.  Patient improved clinically and was back to baseline O2 requirements.  Patient was also maintained on bisoprolol, spironolactone and transitioned to oral torsemide from IV Lasix.  Patient will be discharged in stable and improved condition with close outpatient follow-up with cardiology.    2.  COPD on chronic O2 2 L Patient with complaints of a cough and subsequently started on Mucinex  with scheduled duo nebs, Claritin, PPI.  Patient's cough improved.  Patient be discharged home in stable and improved condition.  Mucinex as needed.  Outpatient follow-up with PCP.   3.  History of coronary artery disease status post cath 10/24/2019 Medical management recommended from recent cath.    Patient was maintained on aspirin, Plavix, bisoprolol and spironolactone during the hospitalization.  Patient was followed by cardiology.  Outpatient follow-up with cardiology.   4.  Hypertension Stable.  5.  Chronic kidney disease stage IIIb Remained stable with diuresis.   6.  History of V. Tach/VF status post AICD Keep magnesium > 2.  Keep potassium greater than 4.    Patient was maintained on home regimen of amiodarone. Per cardiology normal device function with 98% biventricular pacing.  Device readjusted per cardiology due to intermittent loss of LV capture.    Outpatient follow-up with cardiology.    7.  History of peripheral vascular disease status post left subclavian stent Patient maintained on home regimen of continue aspirin, Plavix.  Outpatient follow-up.  8.  Hyperlipidemia Per cardiology patient noted to have been tried on numerous statins which was stopped due to side effects.  Per cardiology patient has declined PCSK9 inhibitor.  Outpatient follow-up.   Procedures:  Chest x-ray  12/05/2019  Consultations:  Cardiology: Dr. Sallyanne Kuster 12/05/2019  Discharge Exam: Vitals:   12/09/19 1249 12/09/19 1355  BP: (!) 105/56   Pulse: 60   Resp: 18   Temp: 98.4 F (36.9 C)   SpO2: 96% 96%    General: NAD Cardiovascular: Regular rate and rhythm with 2/6 holosystolic murmur. Respiratory: Some bibasilar crackles.  Otherwise clear.  Discharge Instructions   Discharge Instructions    Diet - low sodium heart healthy   Complete by: As directed    Increase activity slowly   Complete by: As directed      Allergies as of 12/09/2019      Reactions   Potassium-containing Compounds Other (See Comments)   Causes severe constipation   Neomycin-polymyxin B Gu    Swollen throat    Other Other (See Comments)   "mycin" antibiotics cause sleeplessness and itching   Azithromycin Rash   Codeine Itching   Darvon Itching   Erythromycin Rash   Meloxicam Other (See Comments)   Unknown    Norco [hydrocodone-acetaminophen] Itching   Penicillins Rash,  Other (See Comments)   Has patient had a PCN reaction causing immediate rash, facial/tongue/throat swelling, SOB or lightheadedness with hypotension: unknown Has patient had a PCN reaction causing severe rash involving mucus membranes or skin necrosis: no Has patient had a PCN reaction that required hospitalization: unknown Has patient had a PCN reaction occurring within the last 10 years: no If all of the above answers are "NO", then may proceed with Cephalosporin use.   Propoxyphene N-acetaminophen Itching   Rofecoxib Other (See Comments)   Unknown    Rosuvastatin Other (See Comments)   cramps   Statins Itching, Other (See Comments)   Sleeplessness Sleeplessness   Sulfa Antibiotics Rash      Medication List    TAKE these medications   Accu-Chek FastClix Lancets Misc USE   TO CHECK GLUCOSE ONCE DAILY What changed:   how much to take  how to take this  when to take this  additional instructions   Accu-Chek  SmartView test strip Generic drug: glucose blood USE  STRIP TO CHECK GLUCOSE ONCE DAILY What changed: See the new instructions.   albuterol (2.5 MG/3ML) 0.083% nebulizer solution Commonly known as: PROVENTIL USE 1 VIAL IN NEBULIZER EVERY 4 HOURS AS NEEDED FOR WHEEZING FOR SHORTNESS OF BREATH What changed: See the new instructions.   ProAir HFA 108 (90 Base) MCG/ACT inhaler Generic drug: albuterol INHALE 2 PUFFS BY MOUTH EVERY 4 HOURS AS NEEDED FOR WHEEZING AND FOR SHORTNESS OF BREATH What changed: See the new instructions.   allopurinol 300 MG tablet Commonly known as: ZYLOPRIM Take 1 tablet by mouth once daily   ALPRAZolam 1 MG tablet Commonly known as: XANAX TAKE 1 TABLET BY MOUTH IN THE MORNING AND 1 & 1/2 (ONE & ONE-HALF) NIGHTLY AS NEEDED FOR ANXIETY What changed: See the new instructions.   amiodarone 200 MG tablet Commonly known as: PACERONE Take 1 tablet (200 mg total) by mouth 2 (two) times daily.   aspirin 81 MG chewable tablet Chew 1 tablet (81 mg total) by mouth daily.   azelastine 0.1 % nasal spray Commonly known as: ASTELIN USE 2 SPRAY(S) IN EACH NOSTRIL TWICE DAILY AS DIRECTED What changed: See the new instructions.   bisoprolol 5 MG tablet Commonly known as: ZEBETA Take 1 tablet (5 mg total) by mouth daily. What changed:   medication strength  how much to take  when to take this  Another medication with the same name was removed. Continue taking this medication, and follow the directions you see here.   cholecalciferol 1000 units tablet Commonly known as: VITAMIN D Take 1,000 Units by mouth daily.   clopidogrel 75 MG tablet Commonly known as: PLAVIX Take 1 tablet by mouth once daily   cyanocobalamin 1000 MCG tablet Take 1,000 mcg by mouth daily.   DULoxetine 60 MG capsule Commonly known as: CYMBALTA Take 1 capsule (60 mg total) by mouth 2 (two) times daily.   guaiFENesin 600 MG 12 hr tablet Commonly known as: MUCINEX Take 2 tablets  (1,200 mg total) by mouth 2 (two) times daily as needed.   loratadine 10 MG tablet Commonly known as: CLARITIN Take 10 mg by mouth daily.   Magnesium Oxide 400 MG Caps Take 1 capsule (400 mg total) by mouth daily.   mupirocin ointment 2 % Commonly known as: BACTROBAN Place 1 application into the nose 2 (two) times daily for 4 days.   nitroGLYCERIN 0.4 MG SL tablet Commonly known as: NITROSTAT Place 1 tablet (0.4 mg total) under the tongue  every 5 (five) minutes as needed for chest pain. X 3 doses What changed: additional instructions   pantoprazole 40 MG tablet Commonly known as: PROTONIX Take 1 tablet (40 mg total) by mouth daily at 6 (six) AM. Start taking on: December 10, 2019   spironolactone 25 MG tablet Commonly known as: ALDACTONE Take 0.5 tablets (12.5 mg total) by mouth daily.   torsemide 20 MG tablet Commonly known as: DEMADEX Take 3 tablets (60 mg total) by mouth 2 (two) times daily.      Allergies  Allergen Reactions  . Potassium-Containing Compounds Other (See Comments)    Causes severe constipation  . Neomycin-Polymyxin B Gu     Swollen throat   . Other Other (See Comments)    "mycin" antibiotics cause sleeplessness and itching  . Azithromycin Rash  . Codeine Itching  . Darvon Itching  . Erythromycin Rash  . Meloxicam Other (See Comments)    Unknown    . Norco [Hydrocodone-Acetaminophen] Itching  . Penicillins Rash and Other (See Comments)    Has patient had a PCN reaction causing immediate rash, facial/tongue/throat swelling, SOB or lightheadedness with hypotension: unknown Has patient had a PCN reaction causing severe rash involving mucus membranes or skin necrosis: no Has patient had a PCN reaction that required hospitalization: unknown Has patient had a PCN reaction occurring within the last 10 years: no If all of the above answers are "NO", then may proceed with Cephalosporin use.   Marland Kitchen Propoxyphene N-Acetaminophen Itching  . Rofecoxib Other (See  Comments)    Unknown    . Rosuvastatin Other (See Comments)    cramps  . Statins Itching and Other (See Comments)    Sleeplessness Sleeplessness  . Sulfa Antibiotics Rash    Follow-up Information    [facility] Follow up.   Why: HHRN,HHPT,  you can contact rep at [phone] Contact information: [place] [address] [phone]        Laurey Morale, MD. Go on 12/16/2019.   Specialty: Family Medicine Why: @11 :Max Sane information: [place] [address] [phone]        Croitoru, Dani Gobble, MD. Schedule an appointment as soon as possible for a visit in 1 week(s).   Specialty: Cardiology Why: f/u in 1-2 weeks. Contact information: [address] [place] [address] [phone]                The results of significant diagnostics from this hospitalization (including imaging, microbiology, ancillary and laboratory) are listed below for reference.    Significant Diagnostic Studies: DG Chest Port 1 View  Result Date: 12/05/2019 CLINICAL DATA:  84 year old female with history of severe shortness of breath. Respiratory rest. EXAM: PORTABLE CHEST 1 VIEW COMPARISON:  Chest x-ray 10/22/2019. FINDINGS: There is cephalization of the pulmonary vasculature and slight indistinctness of the interstitial markings suggestive of mild pulmonary edema. Small left pleural effusion. Mild cardiomegaly. The patient is rotated to the right on today's exam, resulting in distortion of the mediastinal contours and reduced diagnostic sensitivity and specificity for mediastinal pathology. Left-sided biventricular pacemaker device in place with lead tips projecting over the expected location of the right atrium, right ventricle and left ventricle via the coronary sinus and coronary veins. IMPRESSION: 1. Appearance the chest suggests mild congestive heart failure, as above. 2. Aortic atherosclerosis. Electronically  Signed   By: Vinnie Langton M.D.   On: 12/05/2019 13:57    Microbiology: Recent Results (from  the past 240 hour(s))  Culture, blood (routine x 2)     Status: None (Preliminary result)   Collection Time: 12/05/19 12:59 PM   Specimen: BLOOD RIGHT FOREARM  Result Value Ref Range Status   Specimen Description BLOOD RIGHT FOREARM  Final   Special Requests   Final    BOTTLES DRAWN AEROBIC AND ANAEROBIC Blood Culture adequate volume   Culture   Final    NO GROWTH 4 DAYS Performed at [facility], [address] [place], [place] [address]    Report Status PENDING  Incomplete  SARS Coronavirus 2 by RT PCR (hospital order, performed in [facility] hospital lab) Nasopharyngeal Nasopharyngeal Swab     Status: None   Collection Time: 12/05/19 12:59 PM   Specimen: Nasopharyngeal Swab  Result Value Ref Range Status   SARS Coronavirus 2 NEGATIVE NEGATIVE Final    Comment: (NOTE) SARS-CoV-2 target nucleic acids are NOT DETECTED.  The SARS-CoV-2 RNA is generally detectable in upper and lower respiratory specimens during the acute phase of infection. The lowest concentration of SARS-CoV-2 viral copies this assay can detect is 250 copies / mL. A negative result does not preclude SARS-CoV-2 infection and should not be used as the sole basis for treatment or other patient management decisions.  A negative result may occur with improper specimen collection / handling, submission of specimen other than nasopharyngeal swab, presence of viral mutation(s) within the areas targeted by this assay, and inadequate number of viral copies (<250 copies / mL). A negative result must be combined with clinical observations, patient history, and epidemiological information.  Fact Sheet for Patients:   StrictlyIdeas.no  Fact Sheet for Healthcare Providers: BankingDealers.co.za  This test is not yet approved or  cleared by the [place] FDA and has been  authorized for detection and/or diagnosis of SARS-CoV-2 by FDA under an Emergency Use Authorization (EUA).  This EUA will remain in effect (meaning this test can be used) for the duration of the COVID-19 declaration under Section 564(b)(1) of the Act, 21 U.S.C. section 360bbb-3(b)(1), unless the authorization is terminated or revoked sooner.  Performed at [facility], [place] [address] [place], [place] [address]   Culture, blood (routine x 2)     Status: None (Preliminary result)   Collection Time: 12/05/19  3:06 PM   Specimen: BLOOD RIGHT HAND  Result Value Ref Range Status   Specimen Description BLOOD RIGHT HAND  Final   Special Requests   Final    BOTTLES DRAWN AEROBIC AND ANAEROBIC Blood Culture results may not be optimal due to an inadequate volume of blood received in culture bottles   Culture   Final    NO GROWTH 4 DAYS Performed at [facility], [place] [address] [place], [place] [address]    Report Status PENDING  Incomplete  MRSA PCR Screening     Status: Abnormal   Collection Time: 12/07/19  9:25 PM   Specimen: Nasal Mucosa; Nasopharyngeal  Result Value Ref Range Status   MRSA by PCR POSITIVE (A) NEGATIVE Final    Comment:        The GeneXpert MRSA Assay (FDA approved for NASAL specimens only), is one component of a comprehensive MRSA colonization surveillance program. It is not intended to diagnose MRSA infection nor to guide or monitor treatment for MRSA infections. RESULT CALLED TO, READ BACK BY AND VERIFIED WITH: Gwen Her RN 12/08/19 0421 JDW Performed at [facility], [place] [address], [place], [place]  [address]      Labs: Basic Metabolic Panel: Recent Labs  Lab 12/05/19 1259 12/05/19 1259 12/05/19 1343 12/06/19 0553 12/07/19 0344 12/08/19 0413 12/09/19 0555  NA 143   < > 143 143 140 141 139  K 4.6   < > 4.0 3.8 4.1 3.7 3.9  CL 102  --   --  100 96* 97* 94*  CO2 29  --   --  33* 31 33* 32  GLUCOSE 152*  --   --  166* 136* 106* 149*   BUN 15  --   --  19 33* 33* 33*  CREATININE 1.24*  --   --  1.49* 1.45* 1.69* 1.33*  CALCIUM 9.0  --   --  9.1 9.0 8.9 9.1  MG 1.9  --   --   --   --  2.1 2.1  PHOS 3.1  --   --   --   --   --   --    < > = values in this interval not displayed.   Liver Function Tests: Recent Labs  Lab 12/05/19 1259  AST 19  ALT 14  ALKPHOS 97  BILITOT 0.7  PROT 6.4*  ALBUMIN 3.6   No results for input(s): LIPASE, AMYLASE in the last 168 hours. No results for input(s): AMMONIA in the last 168 hours. CBC: Recent Labs  Lab 12/05/19 1259 12/05/19 1343 12/06/19 0553 12/08/19 0413  WBC 10.1  --  7.6 10.8*  NEUTROABS 7.4  --   --   --   HGB 13.0 13.9 12.4 13.3  HCT 42.1 41.0 39.9 42.9  MCV 105.3*  --  103.6* 104.6*  PLT 276  --  257 268   Cardiac Enzymes: No results for input(s): CKTOTAL, CKMB, CKMBINDEX, TROPONINI in the last 168 hours. BNP: BNP (last 3 results) Recent Labs    07/13/19 1242 10/22/19 0325 12/05/19 1259  BNP 736.1* 769.0* 1,385.6*    ProBNP (last 3 results) No results for input(s): PROBNP in the last 8760 hours.  CBG: No results for input(s): GLUCAP in the last 168 hours.     Signed:  Irine Seal MD.  [facility] 12/09/2019, 3:05 PM

## 2019-12-09 NOTE — Plan of Care (Signed)
  Problem: Activity: Goal: Risk for activity intolerance will decrease Outcome: Progressing   

## 2019-12-09 NOTE — Progress Notes (Signed)
Physical Therapy Treatment Patient Details Name: Sydney Phillips MRN: [id number] DOB: 11-29-1935 Today's Date: 12/09/2019    History of Present Illness 84 yo female with onset of SOB and lethargy was brought to hosp, had fallen at home, added fluid retention and become hypoxic.  On O2 at home, but desaturated and in resp failure from CHF.  PMHx:  O2 use, HTN, CKD3, CAD, EF 20-25%, COPD, cataracts, OSA, angioplasty, ICD, DM, tobacco use, PVD, VT    PT Comments    Pt's son in room on entry with pt sitting EoB. Son attempting to get pt ready for discharge. Focus of session to be progression of gait and stair training. Pt's son afraid that pt will need more assist than he is able to provide once she is home. Pt upset as she does not want to go to SNF. PT suggests trying to walk to see how safe she will be at home. Pt requires maxA for attempt to come to standing in RW. Pt has increased forward flexion and unable to achieve fully upright. Given increased difficulty with standing pt reluctantly agrees to SNF placement. Son Architectural technologist. Pt request assist to get to recliner and requires maxA for squat pivot transfer to recliner. PT in full agreement with SNF placement. PT will continue to follow acutely.     Follow Up Recommendations  SNF;Supervision for mobility/OOB     Equipment Recommendations  None recommended by PT    Recommendations for Other Services       Precautions / Restrictions Precautions Precautions: Fall Precaution Comments: monitor O2 sats Restrictions Weight Bearing Restrictions: No    Mobility  Bed Mobility Overal bed mobility: Needs Assistance Bed Mobility: Supine to Sit     Supine to sit: Mod assist     General bed mobility comments: sitting EoB on entry   Transfers Overall transfer level: Needs assistance Equipment used: Rolling walker (2 wheeled) Transfers: Sit to/from [facility] Sit to Stand: Max assist Stand pivot transfers: Max  assist Squat pivot transfers: Max assist     General transfer comment: max A for power up to RW, maximal multimodal cuing for reduces forward flexion and upright posture unable to attain so sat back down, max A for squat pivot to recliner, maximal cuing for hand placement and power up   Ambulation/Gait             General Gait Details: unable to achieve standing         Balance Overall balance assessment: History of Falls;Needs assistance Sitting-balance support: Bilateral upper extremity supported;Feet supported Sitting balance-Leahy Scale: Fair   Postural control: Other (comment) (forward lean) Standing balance support: Bilateral upper extremity supported;During functional activity Standing balance-Leahy Scale: Poor Standing balance comment: increased forward lean unable to come to fully upright                             Cognition Arousal/Alertness: Awake/alert Behavior During Therapy: WFL for tasks assessed/performed Overall Cognitive Status: No family/caregiver present to determine baseline cognitive functioning                                 General Comments: Decreased safety awareness and anticpation         General Comments General comments (skin integrity, edema, etc.): Pt on 4L O2 via [place] SaO2 >90%O2 throughout      Pertinent Vitals/Pain Pain Assessment:  No/denies pain    Home Living Family/patient expects to be discharged to:: Private residence Living Arrangements: Children Available Help at Discharge: Family;Available 24 hours/day Type of Home: House Home Access: Stairs to enter Entrance Stairs-Rails: Left Home Layout: One level Home Equipment: Walker - 2 wheels;Cane - single point;Bedside commode;Shower seat Additional Comments: pt requires an assistive device athome with O2 needed    Prior Function Level of Independence: Needs assistance  Gait / Transfers Assistance Needed: home with her son, has assistance for  mobility   Comments: Uses 3L O2 at home but only at night   PT Goals (current goals can now be found in the care plan section) Acute Rehab PT Goals Patient Stated Goal: to get walking again PT Goal Formulation: With patient Time For Goal Achievement: 12/21/19 Potential to Achieve Goals: Good Progress towards PT goals: Not progressing toward goals - comment (increased weakness)    Frequency    Min 3X/week      PT Plan Current plan remains appropriate       AM-PAC PT "6 Clicks" Mobility   Outcome Measure  Help needed turning from your back to your side while in a flat bed without using bedrails?: A Little Help needed moving from lying on your back to sitting on the side of a flat bed without using bedrails?: A Lot Help needed moving to and from a bed to a chair (including a wheelchair)?: Total Help needed standing up from a chair using your arms (e.g., wheelchair or bedside chair)?: Total Help needed to walk in hospital room?: Total Help needed climbing 3-5 steps with a railing? : Total 6 Click Score: 9    End of Session Equipment Utilized During Treatment: Gait belt;Oxygen Activity Tolerance: Patient limited by fatigue;Treatment limited secondary to medical complications (Comment) Patient left: with call bell/phone within reach;with family/visitor present;in chair Nurse Communication: Mobility status PT Visit Diagnosis: Unsteadiness on feet (R26.81);Muscle weakness (generalized) (M62.81);History of falling (Z91.81);Adult, failure to thrive (R62.7)     Time: 9390-3009 PT Time Calculation (min) (ACUTE ONLY): 36 min  Charges:  $Therapeutic Activity: 23-37 mins                     Glendel Jaggers B. Migdalia Dk PT, DPT Acute Rehabilitation Services Pager [phone] Office [phone]    [place] 12/09/2019, 4:20 PM

## 2019-12-09 NOTE — Progress Notes (Signed)
Progress Note  Patient Name: Sydney Phillips Date of Encounter: 12/09/2019  [facility] Cardiologist: [name], MD   Subjective   Feels well. Weight 179 lb. Excellent diuresis, >6 liters since admission. Improving creatinine. No arrhythmia  Inpatient Medications    Scheduled Meds: . allopurinol  300 mg Oral Daily  . ALPRAZolam  1 mg Oral Daily  . amiodarone  200 mg Oral BID  . aspirin  81 mg Oral Daily  . azelastine  2 spray Each Nare BID  . bisoprolol  5 mg Oral Daily  . Chlorhexidine Gluconate Cloth  6 each Topical Q0600  . clopidogrel  75 mg Oral Daily  . DULoxetine  60 mg Oral BID  . enoxaparin (LOVENOX) injection  30 mg Subcutaneous Q24H  . guaiFENesin  1,200 mg Oral BID  . ipratropium-albuterol  3 mL Nebulization TID  . loratadine  10 mg Oral Daily  . magnesium oxide  400 mg Oral Daily  . mouth rinse  15 mL Mouth Rinse BID  . mupirocin ointment  1 application Nasal BID  . pantoprazole  40 mg Oral Q0600  . sodium chloride flush  3 mL Intravenous Q12H  . spironolactone  12.5 mg Oral Daily  . torsemide  60 mg Oral BID   Continuous Infusions: . sodium chloride     PRN Meds: sodium chloride, ALPRAZolam, ipratropium-albuterol, ondansetron (ZOFRAN) IV, sodium chloride flush   Vital Signs    Vitals:   12/09/19 0400 12/09/19 0725 12/09/19 0800 12/09/19 0804  BP: (!) 151/75  (!) 123/106 (!) 145/67  Pulse: 65  71   Resp: 19  (!) 25   Temp:    98.2 F (36.8 C)  TempSrc:    Oral  SpO2: 96% 97% 94%   Weight:      Height:        Intake/Output Summary (Last 24 hours) at 12/09/2019 1056 Last data filed at 12/09/2019 0900 Gross per 24 hour  Intake 1170.44 ml  Output 3900 ml  Net -2729.56 ml   Last 3 Weights 12/09/2019 12/08/2019 12/07/2019  Weight (lbs) 179 lb 6.4 oz 183 lb 192 lb 1.6 oz  Weight (kg) 81.375 kg 83.008 kg 87.136 kg      Telemetry    A sensed, V paced, QRS morphology consistently narrower after adjustment in LV lead output. -  Personally Reviewed  ECG    No new tracing - Personally Reviewed  Physical Exam  Appears well GEN: No acute distress.   Neck: No JVD Cardiac: RRR, 2/6 Ao ej murmur and LLSB holosystolic murmurno diastolic murmurs, rubs, or gallops.  Respiratory: A few rales, cleared with cough. GI: Soft, nontender, non-distended  MS: No edema; No deformity. Neuro:  Nonfocal  Psych: Normal affect   Labs    High Sensitivity Troponin:   Recent Labs  Lab 12/05/19 1259 12/05/19 1545  TROPONINIHS 22* 20*      Chemistry Recent Labs  Lab 12/05/19 1259 12/05/19 1343 12/07/19 0344 12/08/19 0413 12/09/19 0555  NA 143   < > 140 141 139  K 4.6   < > 4.1 3.7 3.9  CL 102   < > 96* 97* 94*  CO2 29   < > 31 33* 32  GLUCOSE 152*   < > 136* 106* 149*  BUN 15   < > 33* 33* 33*  CREATININE 1.24*   < > 1.45* 1.69* 1.33*  CALCIUM 9.0   < > 9.0 8.9 9.1  PROT 6.4*  --   --   --   --  ALBUMIN 3.6  --   --   --   --   AST 19  --   --   --   --   ALT 14  --   --   --   --   ALKPHOS 97  --   --   --   --   BILITOT 0.7  --   --   --   --   GFRNONAA 41*   < > 34* 28* 37*  GFRAA 47*   < > 39* 32* 43*  ANIONGAP 12   < > 13 11 13    < > = values in this interval not displayed.     Hematology Recent Labs  Lab 12/05/19 1259 12/05/19 1259 12/05/19 1343 12/06/19 0553 12/08/19 0413  WBC 10.1  --   --  7.6 10.8*  RBC 4.00  --   --  3.85* 4.10  HGB 13.0   < > 13.9 12.4 13.3  HCT 42.1   < > 41.0 39.9 42.9  MCV 105.3*  --   --  103.6* 104.6*  MCH 32.5  --   --  32.2 32.4  MCHC 30.9  --   --  31.1 31.0  RDW 16.7*  --   --  16.5* 16.3*  PLT 276  --   --  257 268   < > = values in this interval not displayed.    BNP Recent Labs  Lab 12/05/19 1259  BNP 1,385.6*     DDimer No results for input(s): DDIMER in the last 168 hours.   Radiology    No results found.  Cardiac Studies   Device in office check 11/15/2019 CRT-D device check in office. Thresholds and sensing consistent with previous  device measurements. Lead impedance trends stable over time. No mode switch episodes recorded. Known VF with shock, was admitted at the time. Patient bi-ventricularly pacing  98.76% of the time. Device programmed with appropriate safety margins. Heart failure diagnostics reviewed and trends are stable for patient. No changes made this session. Estimated longevity 1 yr, 5 mo. Patient enrolled in remote follow up. Next  11/09/2019. RTC 4 weeks for torsemide adjustment prn.  Left heart catheterization 10/24/2019  Moderate three-vessel coronary disease.  30% ostial left main.  60% ostial LAD (unchanged from 2017) with moderate mid LAD plaque and calcification.  Patent proximal to mid circumflex with diffuse 50% in-stent restenosis. Ostial circumflex contains 30 to 40% narrowing.  RCA contains up to 50% diffuse in-stent restenosis. Proximal to the stented segment is an eccentric 70% region of narrowing  Elevated LVEDP, 31 mmHg suggesting decompensated acute on chronic combined systolic heart failure.  Catheterization from right radial was compromised by difficulty with catheter advancement, most likely due to selection of the radial recurrent route to the subclavian artery requiring that catheter size be downgraded to 4 [place].  Recommendations:   No high-grade thrombotic appearing lesions noted. Three-vessel disease as noted above.  Coronary angiography/PCI attempt in the future should not be via the right radial approach.  Guideline directed therapy for acute on chronic combined systolic and diastolic heart failure. Consider advanced heart failure team consult.  Echocardiogram3/03/2020 1. Left ventricular ejection fraction, by estimation, is 20 to 25%. The  left ventricle has severely decreased function. The left ventricle  demonstrates global hypokinesis. The left ventricular internal cavity size  was mildly dilated. Left ventricular  diastolic parameters are consistent with  Grade II diastolic dysfunction  (pseudonormalization).  2. Right ventricular systolic function  is normal. The right ventricular  size is normal. There is moderately elevated pulmonary artery systolic  pressure.  3. The mitral valve is normal in structure. Moderate mitral valve  regurgitation.  4. Tricuspid valve regurgitation is mild to moderate.  5. The aortic valve is normal in structure. Aortic valve regurgitation is  trivial. No aortic stenosis is present.   Comparison(s): 07/20/17 EF 35-40%.   Patient Profile     84 y.o. female with a hx of CHF, CAD, VT, CRT-D, COPDwhopresented with acute hypoxic/hypercapnic respiratory failure due toCHF exacerbation  Assessment & Plan    1. Acute respiratory failure with hypoxemiaand hypercapnia/pulmonary edema:resolved. Labs c/w chronic CO2 retention.  2. Acute on chronic combined systolic and diastolic heart failure:"dry weight" 175-180. Torsemide 60 mg BID + spironolactone 12.5 mg daily (tendency to hyperkalemia in recent past). Recheck labs in office at [facility] visit in 1-2 weeks. See me in 1-2 months. 3. CAD:No angina, recent cath without new lesions. On statin, aspirin/clopidogrel, beta-blocker. 4. VT:She has had multiple appropriate defibrillator discharges since device implantation, of which she has not always been aware. Currently on amiodarone without recent VT.  5. Amiodarone:We will need to review atwhatpoint we should decrease her dose of amiodaroneto the 200 mg daily dose. Will request Dr. Orvan Falconer.Normal LFTs this admission. 6. Abnormal TSH: likely amio related.  TSH is high, but free T4 is still normal. Might represent sick euthyroid syndrome, but should recheck free T4 in a month or two. 7. CRT-D:Normal device function with 98% biventricular pacing. Device readjusted (LV lead output increased) due to intermittent loss of LV capture. Now appears to have consistent LV capture. 8. COPD:Lowers her threshold for  respiratory decompensation. Note ABG on admission with pCO2 54 and high normal pH, c/w with chronic hypercapnia and metabolic compensation + hyperventilation on admission due to CHF. She is a "chronic CO2 retainer".Unfortunately she continues to smoke (Quit 2019, restarted, but "cut back").On bisoprolol for its highly selective beta 1 properties. 9. UUE:KCMKL BP right arm. Hx L subclavian stent. Has abnormal ABI, asymptomatic (sedentary). Previous L carotid endarterectomy. 10. Dyslipidemia:have tried numerous statins, stopped due to side effects. She declined PCSK9 inh.     [facility] will sign off.   Medication Recommendations:   - Torsemide 60 mg twice daily at 12:00 noon and 6 PM (new dose and time) - spironolactone 12.5 mg daily (restarted, lower dose) - bisoprolol 5 mg daily - amiodarone 200 mg twice daily - magnesium oxide 400 mg daily - clopidogrel 75 mg daily Other recommendations (labs, testing, etc):  BMET and BNP in 1-2 weeks at her TOC visit; TSH and free T4 in 1-2 months Follow up as an outpatient:  TOC visit w APP in 1-2 weeks, with me in clinic in 1-2 months  For questions or updates, please contact [place] Please consult [email or web address] for contact info under        Signed, [name], MD  12/09/2019, 10:56 AM

## 2019-12-09 NOTE — TOC Progression Note (Signed)
Transition of Care [place]) - Progression Note    Patient Details  Name: Sydney Phillips MRN: [id number] Date of Birth: 02-16-1936  Transition of Care [facility]) CM/SW Contact  Shade Flood, LCSW Phone Number: 12/09/2019, 3:27 PM  Clinical Narrative:     Pt is stable for dc and now wants SNF rehab. Spoke with son and reviewed CMS options. Will refer as requested. Will start insurance auth. Pt has PASRR number. TOC will follow.   Expected Discharge Plan: Skilled Nursing Facility Barriers to Discharge: Ship broker, SNF Pending bed offer  Expected Discharge Plan and Services Expected Discharge Plan: [place] In-house Referral: NA Discharge Planning Services: CM Consult Post Acute Care Choice: [place] arrangements for the past 2 months: Single Family Home Expected Discharge Date: 12/09/19                 DME Agency: NA       HH Arranged: RN, Disease Management, PT HH Agency: [place] Date [facility] Agency Contacted: 12/08/19 Time [place]: 1519 Representative spoke with at [place]: [place] ([place]) Interventions    Readmission Risk Interventions Readmission Risk Prevention Plan 12/08/2019 12/08/2019  Transportation Screening Complete Complete  PCP or Specialist Appt within 3-5 Days Complete -  HRI or Home Care Consult Complete -  Social Work Consult for [place] Planning/Counseling Complete Complete  Palliative Care Screening Not Applicable Not Applicable  Medication Review Press photographer) Complete Complete  Some recent data might be hidden

## 2019-12-09 NOTE — Progress Notes (Signed)
Occupational Therapy Evaluation Patient Details Name: Sydney Phillips MRN: [id number] DOB: 1936/03/20 Today's Date: 12/09/2019    History of Present Illness 84 yo female with onset of SOB and lethargy was brought to hosp, had fallen at home, added fluid retention and become hypoxic.  On O2 at home, but desaturated and in resp failure from CHF.  PMHx:  O2 use, HTN, CKD3, CAD, EF 20-25%, COPD, cataracts, OSA, angioplasty, ICD, DM, tobacco use, PVD, VT   Clinical Impression   Patient lives at home with son who is currently there 24 hrs/day, though patient reports he is actively looking for job and may then be out of house during day.  Prior to admission patient was using a RW and requiring some assist from son with mobility.  Reports 3 falls in few days prior to admission.  She required mod assist with bed mobility today and max assist to transfer to chair.  Patient demonstrated poor safety awareness as she initially wanted to attempt transfer without walker.  Recommending discharge to SNF as she has an increased fall risk if returning home and would benefit from increasing strength and balance for safety.  Will continue to follow with OT acutely to address the deficits listed below.      Follow Up Recommendations  SNF;Supervision/Assistance - 24 hour (HHOT if refuses SNF)    Equipment Recommendations  None recommended by OT    Recommendations for Other Services       Precautions / Restrictions Precautions Precautions: Fall Precaution Comments: monitor O2 sats Restrictions Weight Bearing Restrictions: No      Mobility Bed Mobility Overal bed mobility: Needs Assistance Bed Mobility: Supine to Sit     Supine to sit: Mod assist        Transfers Overall transfer level: Needs assistance Equipment used: Rolling walker (2 wheeled) Transfers: Sit to/from [facility] Sit to Stand: Mod assist Stand pivot transfers: Max assist            Balance Overall  balance assessment: History of Falls;Needs assistance Sitting-balance support: Bilateral upper extremity supported;Feet supported Sitting balance-Leahy Scale: Fair   Postural control: Other (comment) (forward lean) Standing balance support: Bilateral upper extremity supported;During functional activity Standing balance-Leahy Scale: Poor                             ADL either performed or assessed with clinical judgement   ADL Overall ADL's : Needs assistance/impaired Eating/Feeding: Independent;Sitting   Grooming: Set up;Sitting   Upper Body Bathing: Minimal assistance;Sitting   Lower Body Bathing: Maximal assistance;Sit to/from stand   Upper Body Dressing : Min guard;Sitting   Lower Body Dressing: Maximal assistance;Sit to/from stand   Toilet Transfer: Maximal assistance;RW;BSC           Functional mobility during ADLs: Maximal assistance;Rolling walker       Vision         Perception     Praxis      Pertinent Vitals/Pain Pain Assessment: No/denies pain     Hand Dominance Right   Extremity/Trunk Assessment Upper Extremity Assessment Upper Extremity Assessment: Generalized weakness           Communication Communication Communication: HOH   Cognition Arousal/Alertness: Awake/alert Behavior During Therapy: WFL for tasks assessed/performed Overall Cognitive Status: No family/caregiver present to determine baseline cognitive functioning  General Comments: Decreased safety awareness and anticpation   General Comments  Patient on 4L O2 [place], SpO2 90 at rest wehn supine at 95 when seated in chaiir.    Exercises     Shoulder Instructions      Home Living Family/patient expects to be discharged to:: Private residence Living Arrangements: Children Available Help at Discharge: Family;Available 24 hours/day Type of Home: House Home Access: Stairs to enter [employer] of Steps:  2 Entrance Stairs-Rails: Left Home Layout: One level         Bathroom Toilet: Standard     Home Equipment: Walker - 2 wheels;Cane - single point;Bedside commode;Shower seat   Additional Comments: pt requires an assistive device athome with O2 needed      Prior Functioning/Environment Level of Independence: Needs assistance  Gait / Transfers Assistance Needed: home with her son, has assistance for mobility     Comments: Uses 3L O2 at home but only at night        OT Problem List: Decreased strength;Decreased activity tolerance;Impaired balance (sitting and/or standing);Decreased safety awareness;Cardiopulmonary status limiting activity      OT Treatment/Interventions: Self-care/ADL training;Therapeutic exercise;Energy conservation;Therapeutic activities;Balance training;Patient/family education    OT Goals(Current goals can be found in the care plan section) Acute Rehab OT Goals Patient Stated Goal: to get walking again OT Goal Formulation: With patient Time For Goal Achievement: 12/23/19 Potential to Achieve Goals: Good  OT Frequency: Min 2X/week   Barriers to D/C:            Co-evaluation              AM-PAC OT "6 Clicks" Daily Activity     Outcome Measure Help from another person eating meals?: None Help from another person taking care of personal grooming?: A Little Help from another person toileting, which includes using toliet, bedpan, or urinal?: A Lot Help from another person bathing (including washing, rinsing, drying)?: A Lot Help from another person to put on and taking off regular upper body clothing?: A Little Help from another person to put on and taking off regular lower body clothing?: A Lot 6 Click Score: 16   End of Session Equipment Utilized During Treatment: Gait belt;Rolling walker;Oxygen Nurse Communication: Mobility status  Activity Tolerance: Patient tolerated treatment well Patient left: in chair;with call bell/phone within  reach  OT Visit Diagnosis: Unsteadiness on feet (R26.81);Repeated falls (R29.6);Muscle weakness (generalized) (M62.81);History of falling (Z91.81)                Time: 8978-4784 OT Time Calculation (min): 38 min Charges:  OT General Charges $OT Visit: 1 Visit OT Evaluation $OT Eval Moderate Complexity: 1 Mod OT Treatments $Self Care/Home Management : 8-22 mins $Therapeutic Activity: 8-22 mins  August Luz, OTR/L  Phylliss Bob 12/09/2019, 2:42 PM

## 2019-12-09 NOTE — TOC Progression Note (Signed)
Transition of Care [facility]) - Progression Note    Patient Details  Name: ELANNA BERT MRN: [id number] Date of Birth: Jan 05, 1936  Transition of Care [facility]) CM/SW Contact  Zenon Mayo, RN Phone Number: 12/09/2019, 3:30 PM  Clinical Narrative:    NCM notified that son now wants mother to go to SNF since she will need more ast.  NCM contacted Malachy Mood with [facility] to cancel referral for [facility] services.   Expected Discharge Plan: Skilled Nursing Facility Barriers to Discharge: Ship broker, SNF Pending bed offer  Expected Discharge Plan and Services Expected Discharge Plan: [place] In-house Referral: NA Discharge Planning Services: CM Consult Post Acute Care Choice: [place] arrangements for the past 2 months: Single Family Home Expected Discharge Date: 12/09/19                 DME Agency: NA       HH Arranged: RN, Disease Management, PT HH Agency: [place] Date [facility] Agency Contacted: 12/08/19 Time [place]: 1519 Representative spoke with at [place]: [place] ([place]) Interventions    Readmission Risk Interventions Readmission Risk Prevention Plan 12/08/2019 12/08/2019  Transportation Screening Complete Complete  PCP or Specialist Appt within 3-5 Days Complete -  HRI or Home Care Consult Complete -  Social Work Consult for [place] Planning/Counseling Complete Complete  Palliative Care Screening Not Applicable Not Applicable  Medication Review Press photographer) Complete Complete  Some recent data might be hidden

## 2019-12-10 LAB — BASIC METABOLIC PANEL
Anion gap: 14 (ref 5–15)
BUN: 35 mg/dL — ABNORMAL HIGH (ref 8–23)
CO2: 33 mmol/L — ABNORMAL HIGH (ref 22–32)
Calcium: 9.2 mg/dL (ref 8.9–10.3)
Chloride: 94 mmol/L — ABNORMAL LOW (ref 98–111)
Creatinine, Ser: 1.43 mg/dL — ABNORMAL HIGH (ref 0.44–1.00)
GFR calc Af Amer: 40 mL/min — ABNORMAL LOW (ref >60)
GFR calc non Af Amer: 34 mL/min — ABNORMAL LOW (ref >60)
Glucose, Bld: 119 mg/dL — ABNORMAL HIGH (ref 70–99)
Potassium: 3.6 mmol/L (ref 3.5–5.1)
Sodium: 141 mmol/L (ref 135–145)

## 2019-12-10 LAB — CBC
HCT: 48 % — ABNORMAL HIGH (ref 36.0–46.0)
Hemoglobin: 14.9 g/dL (ref 12.0–15.0)
MCH: 31.8 pg (ref 26.0–34.0)
MCHC: 31 g/dL (ref 30.0–36.0)
MCV: 102.6 fL — ABNORMAL HIGH (ref 80.0–100.0)
Platelets: 274 10*3/uL (ref 150–400)
RBC: 4.68 MIL/uL (ref 3.87–5.11)
RDW: 15.5 % (ref 11.5–15.5)
WBC: 10.2 10*3/uL (ref 4.0–10.5)
nRBC: 0 % (ref 0.0–0.2)

## 2019-12-10 LAB — CULTURE, BLOOD (ROUTINE X 2)
Culture: NO GROWTH
Culture: NO GROWTH
Special Requests: ADEQUATE

## 2019-12-10 MED ORDER — DOCUSATE SODIUM 100 MG PO CAPS
100.0000 mg | ORAL_CAPSULE | Freq: Two times a day (BID) | ORAL | Status: DC | PRN
  Administered 2019-12-10: 100 mg via ORAL
  Filled 2019-12-10: qty 1

## 2019-12-10 MED ORDER — BISACODYL 5 MG PO TBEC: 5.0000 mg | DELAYED_RELEASE_TABLET | Freq: Every day | ORAL | Status: DC | PRN

## 2019-12-10 NOTE — TOC Progression Note (Signed)
Transition of Care [facility]) - Progression Note    Patient Details  Name: Sydney Phillips MRN: [id number] Date of Birth: 22-Nov-1935  Transition of Care [facility]) CM/SW [place], [place] Phone Number: [phone] 12/10/2019, 3:45 PM  Clinical Narrative:     CSW attempted to follow up with son [name] in regards to bed offer and had to leave a message.  TOC team will continue to assist with discharge planning needs.  Expected Discharge Plan: Skilled Nursing Facility Barriers to Discharge: Ship broker, SNF Pending bed offer  Expected Discharge Plan and Services Expected Discharge Plan: [place] In-house Referral: NA Discharge Planning Services: CM Consult Post Acute Care Choice: [place] arrangements for the past 2 months: Single Family Home Expected Discharge Date: 12/09/19                 DME Agency: NA       HH Arranged: RN, Disease Management, PT HH Agency: [place] Date [facility] Agency Contacted: 12/08/19 Time [place]: 1519 Representative spoke with at [place]: [place] ([place]) Interventions    Readmission Risk Interventions Readmission Risk Prevention Plan 12/08/2019 12/08/2019  Transportation Screening Complete Complete  PCP or Specialist Appt within 3-5 Days Complete -  HRI or Home Care Consult Complete -  Social Work Consult for [place] Planning/Counseling Complete Complete  Palliative Care Screening Not Applicable Not Applicable  Medication Review Press photographer) Complete Complete  Some recent data might be hidden

## 2019-12-10 NOTE — Progress Notes (Signed)
Patient refused BIPAP for tonight. Stated she only wears oxygen at home. RT made patient aware that if she changed her mind to call. RT will continue to monitor.

## 2019-12-10 NOTE — Progress Notes (Signed)
PROGRESS NOTE    Sydney Phillips  KGU:[id number] DOB: 1936/02/11 DOA: 12/05/2019 PCP: Laurey Morale, MD    Chief Complaint  Patient presents with  . Respiratory Distress    Brief Narrative:  84 year old lady prior history of coronary artery disease s/p PCI, ischemic cardiomyopathy chronic systolic heart failure, left ventricular ejection fraction of 20 to 25%, stage IIl CKD, severe COPD on 2 L of nasal cannula oxygen, hypertension, type 2 diabetes, history of VT/VF s/p AICD, peripheral vascular disease, obstructive sleep apnea, hyperlipidemia, [place] to ED with complaints of shortness of breath since Monday.  On arrival to ED patient was lethargic and required BiPAP.  While in ED she was given IV Solu-Medrol and duo nebs and referred to [facility] for admission.  Chest x-ray showed mild CHF.  ABG on admission revealed pH of 7.4, PCO2 53, PO2 of 94.  She was started on IV Lasix and cardiology consulted for further evaluation.  Patient seen and examined this morning, she was weaned off BiPAP and transition to 3 L of nasal cannula oxygen.    Assessment & Plan:   Principal Problem:   Acute on chronic respiratory failure with hypoxia and hypercapnia (HCC) Active Problems:   COPD (chronic obstructive pulmonary disease) (HCC)   PVD,   CAD S/P percutaneous coronary angioplasty   CKD (chronic kidney disease), stage III   V-tach (HCC)   Acute on chronic congestive heart failure (HCC)   1 acute on chronic respiratory failure with hypoxia and hypercapnia secondary to acute on chronic combined systolic diastolic heart failure Patient had presented with shortness of breath, in the setting of a history of severe COPD on 2 L nasal cannula, noted EF of 20 to 25%, left ventricle global hypokinesis, grade 2 diastolic dysfunction (2D echo 08/24/2019 )and placed on IV Lasix.  BNP on admission elevated at 1385.6.  Cardiac enzymes minimally elevated but flattened.  Patient initially was requiring BiPAP and has  been transitioned to 3 L nasal cannula.  Urine output of 2.5 L over the past 24 hours.  Patient is -7.180 L during this hospitalization.  Patient clinically improved and has been transitioned from IV diuretics back to oral torsemide 60 mg twice daily.  Patient also on spironolactone, bisoprolol, aspirin.  Outpatient follow-up with cardiology.  2.  COPD on chronic O2 2 L Stable.  Patient with complaints of a cough and subsequently has been started on Mucinex with clinical improvement.  Continue duo nebs as needed, PPI, Claritin.  3.  History of coronary artery disease status post cath 10/24/2019 Medical management recommended from recent cath.  Continue aspirin, Plavix, bisoprolol, spironolactone, Demadex.  Outpatient follow-up with cardiology.   4.  Hypertension Stable.  5.  Chronic kidney disease stage IIIb Stable.  Monitor with diuresis.  6.  History of V. Tach/VF status post AICD Keep magnesium > 2.  Keep potassium greater than 4.  Continue amiodarone.  Per cardiology normal device function with 98% biventricular pacing.  Device readjusted per cardiology due to intermittent loss of LV capture.  K. Dur 40 mEq p.o. x1.  Per cardiology.  7.  History of peripheral vascular disease status post left subclavian stent Continue aspirin, Plavix.  Outpatient follow-up.  8.  Hyperlipidemia Per cardiology patient noted to have been tried on numerous statins which was stopped due to side effects.  Per cardiology patient has declined PCSK9 inhibitor.  Outpatient follow-up.   DVT prophylaxis: Lovenox Code Status: Full Family Communication: Updated patient.  No family at bedside. Disposition:  Status is: Inpatient    Dispo: The patient is from: Home              Anticipated d/c is to: SNF              Anticipated d/c date is: 12/12/2019              Patient currently awaiting SNF placement.         Consultants:   Cardiology: Dr. Sallyanne Kuster 12/05/2019  Procedures:   Chest x-ray  12/05/2019    Antimicrobials:   None   Subjective: Patient sleeping but arousable.  Denies any chest pain.  No abdominal pain.  Few shortness of breath is improved.  Cough improved on Mucinex.  Patient was to be discharged home yesterday however due to significant weakness patient changed her mind with her family and decided SNF was a better option.  Objective: Vitals:   12/10/19 0518 12/10/19 0752 12/10/19 0830 12/10/19 1025  BP: (!) 150/57     Pulse: (!) 59     Resp: 19     Temp: 97.7 F (36.5 C) 98.8 F (37.1 C)  97.7 F (36.5 C)  TempSrc: Oral Oral  Oral  SpO2: 97%  96%   Weight: 80.5 kg     Height:        Intake/Output Summary (Last 24 hours) at 12/10/2019 1133 Last data filed at 12/10/2019 0900 Gross per 24 hour  Intake 1140 ml  Output 2500 ml  Net -1360 ml   Filed Weights   12/08/19 0346 12/09/19 0306 12/10/19 0518  Weight: 83 kg 81.4 kg 80.5 kg    Examination:  General exam: NAD. Respiratory system: Clear to auscultation bilaterally.  No wheezes, no crackles, no rhonchi.  Normal respiratory effort.   Cardiovascular system: RRR with 2/6 holosystolic murmur in the LLSB.  No lower extremity edema.  Gastrointestinal system: Abdomen is nontender, nondistended, soft, positive bowel sounds.  No rebound.  No guarding.  Central nervous system: Alert and oriented.  No focal neurological deficits.  Extremities: Symmetric 5 x 5 power. Skin: No rashes, lesions or ulcers Psychiatry: Judgement and insight appear normal. Mood & affect appropriate.     Data Reviewed: I have personally reviewed following labs and imaging studies  CBC: Recent Labs  Lab 12/05/19 1259 12/05/19 1343 12/06/19 0553 12/08/19 0413 12/10/19 0548  WBC 10.1  --  7.6 10.8* 10.2  NEUTROABS 7.4  --   --   --   --   HGB 13.0 13.9 12.4 13.3 14.9  HCT 42.1 41.0 39.9 42.9 48.0*  MCV 105.3*  --  103.6* 104.6* 102.6*  PLT 276  --  257 268 829    Basic Metabolic Panel: Recent Labs  Lab  12/05/19 1259 12/05/19 1343 12/06/19 0553 12/07/19 0344 12/08/19 0413 12/09/19 0555 12/10/19 0548  NA 143   < > 143 140 141 139 141  K 4.6   < > 3.8 4.1 3.7 3.9 3.6  CL 102   < > 100 96* 97* 94* 94*  CO2 29   < > 33* 31 33* 32 33*  GLUCOSE 152*   < > 166* 136* 106* 149* 119*  BUN 15   < > 19 33* 33* 33* 35*  CREATININE 1.24*   < > 1.49* 1.45* 1.69* 1.33* 1.43*  CALCIUM 9.0   < > 9.1 9.0 8.9 9.1 9.2  MG 1.9  --   --   --  2.1 2.1  --   PHOS 3.1  --   --   --   --   --   --    < > =  values in this interval not displayed.    GFR: Estimated Creatinine Clearance: 32.3 mL/min (A) (by C-G formula based on SCr of 1.43 mg/dL (H)).  Liver Function Tests: Recent Labs  Lab 12/05/19 1259  AST 19  ALT 14  ALKPHOS 97  BILITOT 0.7  PROT 6.4*  ALBUMIN 3.6    CBG: No results for input(s): GLUCAP in the last 168 hours.   Recent Results (from the past 240 hour(s))  Culture, blood (routine x 2)     Status: None   Collection Time: 12/05/19 12:59 PM   Specimen: BLOOD RIGHT FOREARM  Result Value Ref Range Status   Specimen Description BLOOD RIGHT FOREARM  Final   Special Requests   Final    BOTTLES DRAWN AEROBIC AND ANAEROBIC Blood Culture adequate volume   Culture   Final    NO GROWTH 5 DAYS Performed at [facility], [address] [place], [place] [address]    Report Status 12/10/2019 FINAL  Final  SARS Coronavirus 2 by RT PCR (hospital order, performed in [facility] hospital lab) Nasopharyngeal Nasopharyngeal Swab     Status: None   Collection Time: 12/05/19 12:59 PM   Specimen: Nasopharyngeal Swab  Result Value Ref Range Status   SARS Coronavirus 2 NEGATIVE NEGATIVE Final    Comment: (NOTE) SARS-CoV-2 target nucleic acids are NOT DETECTED.  The SARS-CoV-2 RNA is generally detectable in upper and lower respiratory specimens during the acute phase of infection. The lowest concentration of SARS-CoV-2 viral copies this assay can detect is 250 copies / mL. A negative  result does not preclude SARS-CoV-2 infection and should not be used as the sole basis for treatment or other patient management decisions.  A negative result may occur with improper specimen collection / handling, submission of specimen other than nasopharyngeal swab, presence of viral mutation(s) within the areas targeted by this assay, and inadequate number of viral copies (<250 copies / mL). A negative result must be combined with clinical observations, patient history, and epidemiological information.  Fact Sheet for Patients:   StrictlyIdeas.no  Fact Sheet for Healthcare Providers: BankingDealers.co.za  This test is not yet approved or  cleared by the [place] FDA and has been authorized for detection and/or diagnosis of SARS-CoV-2 by FDA under an Emergency Use Authorization (EUA).  This EUA will remain in effect (meaning this test can be used) for the duration of the COVID-19 declaration under Section 564(b)(1) of the Act, 21 U.S.C. section 360bbb-3(b)(1), unless the authorization is terminated or revoked sooner.  Performed at [facility], [place] [address] [place], [place] [address]   Culture, blood (routine x 2)     Status: None   Collection Time: 12/05/19  3:06 PM   Specimen: BLOOD RIGHT HAND  Result Value Ref Range Status   Specimen Description BLOOD RIGHT HAND  Final   Special Requests   Final    BOTTLES DRAWN AEROBIC AND ANAEROBIC Blood Culture results may not be optimal due to an inadequate volume of blood received in culture bottles   Culture   Final    NO GROWTH 5 DAYS Performed at [facility], [place] [address] [place], [place] [address]    Report Status 12/10/2019 FINAL  Final  MRSA PCR Screening     Status: Abnormal   Collection Time: 12/07/19  9:25 PM   Specimen: Nasal Mucosa; Nasopharyngeal  Result Value Ref Range Status   MRSA by PCR POSITIVE (A) NEGATIVE Final    Comment:  The  GeneXpert MRSA Assay (FDA approved for NASAL specimens only), is one component of a comprehensive MRSA colonization surveillance program. It is not intended to diagnose MRSA infection nor to guide or monitor treatment for MRSA infections. RESULT CALLED TO, READ BACK BY AND VERIFIED WITH: Gwen Her RN 12/08/19 0421 JDW Performed at [facility], [place] [address], [place], [place] [address]          Radiology Studies: No results found.      Scheduled Meds: . allopurinol  300 mg Oral Daily  . ALPRAZolam  1 mg Oral Daily  . amiodarone  200 mg Oral BID  . aspirin  81 mg Oral Daily  . azelastine  2 spray Each Nare BID  . bisoprolol  5 mg Oral Daily  . Chlorhexidine Gluconate Cloth  6 each Topical Q0600  . clopidogrel  75 mg Oral Daily  . DULoxetine  60 mg Oral BID  . enoxaparin (LOVENOX) injection  40 mg Subcutaneous Q24H  . guaiFENesin  1,200 mg Oral BID  . ipratropium-albuterol  3 mL Nebulization TID  . loratadine  10 mg Oral Daily  . magnesium oxide  400 mg Oral Daily  . mouth rinse  15 mL Mouth Rinse BID  . mupirocin ointment  1 application Nasal BID  . pantoprazole  40 mg Oral Q0600  . sodium chloride flush  3 mL Intravenous Q12H  . spironolactone  12.5 mg Oral Daily  . torsemide  60 mg Oral BID   Continuous Infusions: . sodium chloride       LOS: 5 days    Time spent: 35 minutes    Irine Seal, MD [facility]   To contact the attending provider between 7A-7P or the covering provider during after hours 7P-7A, please log into the web site [email or web address] and access using universal [place] password for that web site. If you do not have the password, please call the hospital operator.  12/10/2019, 11:33 AM

## 2019-12-10 NOTE — Plan of Care (Signed)
  Problem: Nutrition: Goal: Adequate nutrition will be maintained Outcome: Progressing   Problem: Coping: Goal: Level of anxiety will decrease Outcome: Progressing   Problem: Elimination: Goal: Will not experience complications related to bowel motility Outcome: Progressing Goal: Will not experience complications related to urinary retention Outcome: Progressing   

## 2019-12-11 LAB — BASIC METABOLIC PANEL
Anion gap: 12 (ref 5–15)
BUN: 34 mg/dL — ABNORMAL HIGH (ref 8–23)
CO2: 33 mmol/L — ABNORMAL HIGH (ref 22–32)
Calcium: 9 mg/dL (ref 8.9–10.3)
Chloride: 94 mmol/L — ABNORMAL LOW (ref 98–111)
Creatinine, Ser: 1.45 mg/dL — ABNORMAL HIGH (ref 0.44–1.00)
GFR calc Af Amer: 39 mL/min — ABNORMAL LOW (ref >60)
GFR calc non Af Amer: 34 mL/min — ABNORMAL LOW (ref >60)
Glucose, Bld: 113 mg/dL — ABNORMAL HIGH (ref 70–99)
Potassium: 4.1 mmol/L (ref 3.5–5.1)
Sodium: 139 mmol/L (ref 135–145)

## 2019-12-11 LAB — MAGNESIUM: Magnesium: 2.2 mg/dL (ref 1.7–2.4)

## 2019-12-11 LAB — SARS CORONAVIRUS 2 (TAT 6-24 HRS): SARS Coronavirus 2: NEGATIVE

## 2019-12-11 NOTE — Evaluation (Signed)
Clinical/Bedside Swallow Evaluation Patient Details  Name: Sydney Phillips MRN: [id number] Date of Birth: 1936/04/28  Today's Date: 12/11/2019 Time: SLP Start Time (ACUTE ONLY): 1618 SLP Stop Time (ACUTE ONLY): 1635 SLP Time Calculation (min) (ACUTE ONLY): 17 min  Past Medical History:  Past Medical History:  Diagnosis Date  . Anxiety   . Arthritis   . Asthma   . Automatic implantable cardioverter-defibrillator in situ   . Bronchitis   . CAD (coronary artery disease)    a. s/p DES to LCx/RCA 05/2016, ostial LAD disease.  . Cardiomyopathy ([place])   . Chronic back pain   . Chronic combined systolic and diastolic CHF (congestive heart failure) ([place])   . Chronic constipation   . Chronic pain   . CKD (chronic kidney disease), stage III   . Colon polyps 2003.  2015.   HP polyps 2003.  adnomas 2015.  required referal to [facility] for colonoscopic resection of flat polyps.   Marland Kitchen COPD (chronic obstructive pulmonary disease) ([place])   . Dementia ([place])   . Depression   . Depression with anxiety    takes Cymbalta daily  . Diabetes mellitus ([place])   . Dyspnea   . Early cataracts, bilateral   . Fibromyalgia   . GERD (gastroesophageal reflux disease)    was on meds but was taken off;now watches what she eats  . Hemorrhoids   . History of kidney stones   . Hx of colonic polyps   . Hyperlipemia    takes Crestor daily  . Hypertension    takes Amlodipine and Metoprolol daily  . Insomnia   . LBBB (left bundle branch block)    Stress test 09/03/2010, EF 55  . Myocardial infarction ([place])   . PAD (peripheral artery disease) (HCC)    Carotid, subclavian, and lower extremity beds, currently not symptomatic  . Presence of combination internal cardiac defibrillator (ICD) and pacemaker   . Presence of permanent cardiac pacemaker   . Pulmonary hypertension ([place])   . S/P angioplasty with stent, lt. subclavian 07/31/11 08/01/2011  . Subclavian arterial stenosis, lt, with PTA/STENT 07/31/11 08/01/2011  .  Syncope 07/28/2011   EF - 50-55, moderate concentric hypertrophy in left ventricle  . Urinary incontinence   . Vertigo    takes Meclizine prn   Past Surgical History:  Past Surgical History:  Procedure Laterality Date  . ABDOMINAL AORTAGRAM N/A 08/12/2013   Procedure: ABDOMINAL Maxcine Ham;  Surgeon: Elam Dutch, MD;  Location: [facility] CATH LAB;  Service: Cardiovascular;  Laterality: N/A;  . ABDOMINAL HYSTERECTOMY    . APPENDECTOMY    . BACK SURGERY  2012  . BIV ICD GENERTAOR CHANGE OUT Left 02/20/2012   Procedure: BIV ICD GENERTAOR CHANGE OUT;  Surgeon: [name], MD;  Location: [facility] CATH LAB;  Service: Cardiovascular;  Laterality: Left;  . CARDIAC CATHETERIZATION  12/01/2007   By Dr. Melvern Banker, left heart cath,   . CARDIAC CATHETERIZATION N/A 05/20/2016   Procedure: Right/Left Heart Cath and Coronary Angiography;  Surgeon: Sherren Mocha, MD;  Location: [place] CV LAB;  Service: Cardiovascular;  Laterality: N/A;  . CARDIAC CATHETERIZATION N/A 05/20/2016   Procedure: Coronary Stent Intervention;  Surgeon: Sherren Mocha, MD;  Location: [place] CV LAB;  Service: Cardiovascular;  Laterality: N/A;  . CARDIAC DEFIBRILLATOR PLACEMENT  05/2008   By Dr Blanch Media, [employer] CANNOT HAVE MRI's  . CAROTID ANGIOGRAM N/A 07/31/2011   Procedure: CAROTID ANGIOGRAM;  Surgeon: Lorretta Harp, MD;  Location: [facility] CATH LAB;  Service: Cardiovascular;  Laterality: N/A;  carotid angiogram and possible Lt SCA PTA  . COLONOSCOPY W/ POLYPECTOMY  12/2013  . CORONARY ANGIOPLASTY    . ENDARTERECTOMY Left 11/08/2014   Procedure: LEFT CAROTID ENDARTERECTOMY WITH HEMASHIELD PATCH ANGIOPLASTY;  Surgeon: Elam Dutch, MD;  Location: [facility];  Service: Vascular;  Laterality: Left;  . ESOPHAGOGASTRODUODENOSCOPY N/A 01/20/2014   Procedure: ESOPHAGOGASTRODUODENOSCOPY (EGD);  Surgeon: Jerene Bears, MD;  Location: [facility] ENDOSCOPY;  Service: Endoscopy;  Laterality: N/A;  . FEMORAL-POPLITEAL BYPASS GRAFT Right 10/12/2013    Procedure:   Femoral-Peroneal trunk  bypass with nonreversed greater saphenous vein graft;  Surgeon: Elam Dutch, MD;  Location: [place];  Service: Vascular;  Laterality: Right;  . GIVENS CAPSULE STUDY N/A 01/20/2014   Procedure: GIVENS CAPSULE STUDY;  Surgeon: Jerene Bears, MD;  Location: [place];  Service: Gastroenterology;  Laterality: N/A;  . ICD GENERATOR CHANGEOUT N/A 03/04/2017   Procedure: ICD Generator Changeout;  Surgeon: [name], MD;  Location: [place] CV LAB;  Service: Cardiovascular;  Laterality: N/A;  . INSERT / REPLACE / REMOVE PACEMAKER    . INTRAOPERATIVE ARTERIOGRAM Right 10/12/2013   Procedure: INTRA OPERATIVE ARTERIOGRAM;  Surgeon: Elam Dutch, MD;  Location: [facility];  Service: Vascular;  Laterality: Right;  . LEFT HEART CATH AND CORONARY ANGIOGRAPHY N/A 10/24/2019   Procedure: LEFT HEART CATH AND CORONARY ANGIOGRAPHY;  Surgeon: Belva Crome, MD;  Location: [place] CV LAB;  Service: Cardiovascular;  Laterality: N/A;  . ORIF ELBOW FRACTURE  08/16/2011   Procedure: OPEN REDUCTION INTERNAL FIXATION (ORIF) ELBOW/OLECRANON FRACTURE;  Surgeon: Schuyler Amor, MD;  Location: [facility];  Service: Orthopedics;  Laterality: Left;  . RENAL ANGIOGRAM N/A 08/12/2013   Procedure: RENAL ANGIOGRAM;  Surgeon: Elam Dutch, MD;  Location: [facility] CATH LAB;  Service: Cardiovascular;  Laterality: N/A;  . SUBCLAVIAN STENT PLACEMENT Left 07/31/2011   7x18 Genesis, balloon, with reduction of 90% ostial left subclavian artery stenosis to 0% with residual excellent flow  . TONSILLECTOMY    . TUBAL LIGATION     HPI:  Sydney Phillips is an 84 y.o. female with medical history significant of CAD s/p PCI, ischemic CM, PAD, HFrEF (20-25%), CKD II, severe COPD on 2 L of oxygen, HTN, DM2, prior VT/VF s/p ICD, PVD, OSA, HLD, tobacco abuse, and PAH who presented to the ED with complaints of shortness of breath which started on 6/20. CXR 6/21: Appearance the chest suggests mild congestive heart failure.  Per RN's note from 6/27, "RN went to room and found patient coughing. Patient states she choked on a piece of sausage and egg. Patient had small amount of emesis coated with phlegm.  Patient suctioned, nothing came out.  Patient was able to cough out the food particle from her throat.  O2 SAT remained above 96% at 4L oxygen."   Assessment / Plan / Recommendation Clinical Impression  Sydney Phillips was seen for bedside swallow evaluation. Sydney Phillips reported that twice today food "would not go down" and she "got choked". She stated that she feels there are "a bunch of bubbles" when this occurs and that she has been symptomatic for a few months. Per the Sydney Phillips, eating graham crackers inconsistently helps "force it on down" but particulate foods such as rice also "go down her windpipe". Oral mechanism exam was [facility]. She was edentulous and reported that she has been eating without dentures for more than two years. She tolerated all solids and liquids without overt s/sx of aspiration. Mastication time was mildly increased  with regular textures secondary to edentulous status. Considering the Sydney Phillips's reported symptoms of dysphagia, her diet will be modified to dysphagia 3 solids with thin liquids and a modified barium swallow study will be conducted to assess physiology.  SLP Visit Diagnosis: Dysphagia, unspecified (R13.10)    Aspiration Risk  Mild aspiration risk    Diet Recommendation Dysphagia 3 (Mech soft)   Liquid Administration via: Cup;Straw Medication Administration: Whole meds with puree Supervision: Patient able to self feed Compensations: Slow rate;Small sips/bites;Follow solids with liquid Postural Changes: Seated upright at 90 degrees    Other  Recommendations     Follow up Recommendations Other (comment) (TBd)      Frequency and Duration min 2x/week  2 weeks       Prognosis Prognosis for Safe Diet Advancement: Good      Swallow Study   General Date of Onset: 12/11/19 HPI: Sydney Phillips is an 84 y.o. female with  medical history significant of CAD s/p PCI, ischemic CM, PAD, HFrEF (20-25%), CKD II, severe COPD on 2 L of oxygen, HTN, DM2, prior VT/VF s/p ICD, PVD, OSA, HLD, tobacco abuse, and PAH who presented to the ED with complaints of shortness of breath which started on 6/20. CXR 6/21: Appearance the chest suggests mild congestive heart failure. Per RN's note from 6/27, "RN went to room and found patient coughing. Patient states she choked on a piece of sausage and egg. Patient had small amount of emesis coated with phlegm.  Patient suctioned, nothing came out.  Patient was able to cough out the food particle from her throat.  O2 SAT remained above 96% at 4L oxygen." Type of Study: Bedside Swallow Evaluation Previous Swallow Assessment: None Diet Prior to this Study: Regular;Thin liquids Temperature Spikes Noted: No Respiratory Status: Nasal cannula History of Recent Intubation: No Behavior/Cognition: Alert;Cooperative;Pleasant mood Oral Cavity Assessment: Within Functional Limits Oral Care Completed by SLP: No Oral Cavity - Dentition: Edentulous Vision: Functional for self-feeding Self-Feeding Abilities: Able to feed self Patient Positioning: Upright in bed;Postural control adequate for testing Baseline Vocal Quality: Normal Volitional Cough: Strong Volitional Swallow: Able to elicit    Oral/Motor/Sensory Function Overall Oral Motor/Sensory Function: Within functional limits   Ice Chips Ice chips: Within functional limits Presentation: Spoon   Thin Liquid Thin Liquid: Within functional limits Presentation: Spoon    Nectar Thick Nectar Thick Liquid: Not tested   Honey Thick Honey Thick Liquid: Not tested   Puree Puree: Within functional limits Presentation: Spoon   Solid     Solid: Within functional limits Presentation: Demetrius Charity I. Hardin Negus, [place], [place] Office number [phone] Pager [phone]  Horton Marshall 12/11/2019,5:31  PM

## 2019-12-11 NOTE — TOC Progression Note (Signed)
Transition of Care [facility]) - Progression Note    Patient Details  Name: Sydney Phillips MRN: [id number] Date of Birth: 04-Dec-1935  Transition of Care [facility]) CM/SW [place], [place] Phone Number: 12/11/2019, 4:47 PM  Clinical Narrative:    CSW received follow-up call from patient's son to provide bed choice of [facility] SNF. CSW explained [facility] is not one of the SNFs that made an offer and again went over the offers. Son made choice of [facility].   CSW updated insurance of bed choice. TOC team will continue to follow.  Expected Discharge Plan: Skilled Nursing Facility Barriers to Discharge: Ship broker, SNF Pending bed offer  Expected Discharge Plan and Services Expected Discharge Plan: [place] In-house Referral: NA Discharge Planning Services: CM Consult Post Acute Care Choice: [place] arrangements for the past 2 months: Single Family Home Expected Discharge Date: 12/09/19                 DME Agency: NA       HH Arranged: RN, Disease Management, PT HH Agency: [place] Date [facility] Agency Contacted: 12/08/19 Time [place]: 1519 Representative spoke with at [place]: [place] ([place]) Interventions    Readmission Risk Interventions Readmission Risk Prevention Plan 12/08/2019 12/08/2019  Transportation Screening Complete Complete  PCP or Specialist Appt within 3-5 Days Complete -  HRI or Home Care Consult Complete -  Social Work Consult for [place] Planning/Counseling Complete Complete  Palliative Care Screening Not Applicable Not Applicable  Medication Review Press photographer) Complete Complete  Some recent data might be hidden

## 2019-12-11 NOTE — Progress Notes (Signed)
Patient pressed call light for help.  RN went to room and found patient coughing.  Patient states she choked on a piece of sausage and egg.  Patient had small amount of emesis coated with phlegm.  Patient suctioned, nothing came out.  Patient was able to cough out the food particle from her throat.  O2 SAT remained above 96% at 4L oxygen.  Patient cleaned up, mouth care performed.  Patient states she feels better.  RN will monitor patient.  Marcille Blanco, RN

## 2019-12-11 NOTE — Progress Notes (Signed)
PROGRESS NOTE    Sydney Phillips  JSH:[id number] DOB: 26-Feb-1936 DOA: 12/05/2019 PCP: Laurey Morale, MD    Chief Complaint  Patient presents with  . Respiratory Distress    Brief Narrative:  84 year old lady prior history of coronary artery disease s/p PCI, ischemic cardiomyopathy chronic systolic heart failure, left ventricular ejection fraction of 20 to 25%, stage IIl CKD, severe COPD on 2 L of nasal cannula oxygen, hypertension, type 2 diabetes, history of VT/VF s/p AICD, peripheral vascular disease, obstructive sleep apnea, hyperlipidemia, [place] to ED with complaints of shortness of breath since Monday.  On arrival to ED patient was lethargic and required BiPAP.  While in ED she was given IV Solu-Medrol and duo nebs and referred to [facility] for admission.  Chest x-ray showed mild CHF.  ABG on admission revealed pH of 7.4, PCO2 53, PO2 of 94.  She was started on IV Lasix and cardiology consulted for further evaluation.  Patient seen and examined this morning, she was weaned off BiPAP and transition to 3 L of nasal cannula oxygen.    Assessment & Plan:   Principal Problem:   Acute on chronic respiratory failure with hypoxia and hypercapnia (HCC) Active Problems:   COPD (chronic obstructive pulmonary disease) (HCC)   PVD,   CAD S/P percutaneous coronary angioplasty   CKD (chronic kidney disease), stage III   V-tach (HCC)   Acute on chronic congestive heart failure (HCC)   1 acute on chronic respiratory failure with hypoxia and hypercapnia secondary to acute on chronic combined systolic diastolic heart failure Patient had presented with shortness of breath, in the setting of a history of severe COPD on 2 L nasal cannula, noted EF of 20 to 25%, left ventricle global hypokinesis, grade 2 diastolic dysfunction (2D echo 08/24/2019 )and placed on IV Lasix.  BNP on admission elevated at 1385.6.  Cardiac enzymes minimally elevated but flattened.  Patient initially was requiring BiPAP and has  been transitioned to 3 L nasal cannula.  Patient noted to be refusing BiPAP at bedtime.  Urine output of 2.225 L over the past 24 hours.  Patient is - 8.265 L during this hospitalization.  Patient clinically improved and has been transitioned from IV diuretics back to oral torsemide 60 mg twice daily.  Patient also on spironolactone, bisoprolol, aspirin.  DC BiPAP.  Outpatient follow-up with cardiology.  2.  COPD on chronic O2 2 L Stable.  Patient with complaints of a cough and subsequently has been started on Mucinex with clinical improvement.  PPI, Claritin, duo nebs as needed.   3.  History of coronary artery disease status post cath 10/24/2019 Medical management recommended from recent cath per cardiology.  Continue aspirin, bisoprolol, spironolactone, Demadex, Plavix.  Outpatient follow-up with cardiology.   4.  Hypertension Stable.  5.  Chronic kidney disease stage IIIb Stable.  Monitor with diuresis.  6.  History of V. Tach/VF status post AICD Keep magnesium > 2.  Keep potassium greater than 4.  Continue amiodarone.  Per cardiology normal device function with 98% biventricular pacing.  Device readjusted per cardiology due to intermittent loss of LV capture. Per cardiology.  7.  History of peripheral vascular disease status post left subclavian stent Stable.  Continue Plavix and aspirin.  Outpatient follow-up.   8.  Hyperlipidemia Per cardiology patient noted to have been tried on numerous statins which was stopped due to side effects.  Per cardiology patient has declined PCSK9 inhibitor.  Outpatient follow-up.   DVT prophylaxis: Lovenox Code Status:  Full Family Communication: Updated patient.  No family at bedside. Disposition:   Status is: Inpatient    Dispo: The patient is from: Home              Anticipated d/c is to: SNF              Anticipated d/c date is: 12/12/2019              Patient medically stable awaiting SNF placement.         Consultants:   Cardiology:  Dr. Sallyanne Kuster 12/05/2019  Procedures:   Chest x-ray 12/05/2019    Antimicrobials:   None   Subjective: Patient sitting up in chair sleeping.  Arousable.  Denies any chest pain.  Denies any significant shortness of breath.  No abdominal pain.  Patient stated had a choking episode while eating a piece of sausage and egg this morning.  Currently feeling better.    Objective: Vitals:   12/10/19 2048 12/10/19 2348 12/11/19 0355 12/11/19 0733  BP: (!) 132/49 (!) 139/54 139/66 125/61  Pulse: (!) 59 63 68 (!) 59  Resp: 19 19 19 18   Temp: 98.2 F (36.8 C) 98.2 F (36.8 C) 98.3 F (36.8 C) 97.6 F (36.4 C)  TempSrc: Oral Oral Oral Oral  SpO2: 94% 94% 98% 96%  Weight:   80.6 kg   Height:        Intake/Output Summary (Last 24 hours) at 12/11/2019 1106 Last data filed at 12/11/2019 0867 Gross per 24 hour  Intake 900 ml  Output 2825 ml  Net -1925 ml   Filed Weights   12/09/19 0306 12/10/19 0518 12/11/19 0355  Weight: 81.4 kg 80.5 kg 80.6 kg    Examination:  General exam: NAD. Respiratory system: Some bibasilar crackles/coarse breath sounds.  No wheezing.  Normal respiratory effort.  Speaking in full sentences.  Cardiovascular system: Regular rate rhythm with 2/6 holosystolic murmur in the LLSB.  No lower extremity edema.  Gastrointestinal system: Abdomen is soft, nontender, nondistended, positive bowel sounds.  No rebound.  No guarding.  Central nervous system: Alert and oriented.  No focal neurological deficits.  Extremities: Symmetric 5 x 5 power. Skin: No rashes, lesions or ulcers Psychiatry: Judgement and insight appear normal. Mood & affect appropriate.     Data Reviewed: I have personally reviewed following labs and imaging studies  CBC: Recent Labs  Lab 12/05/19 1259 12/05/19 1343 12/06/19 0553 12/08/19 0413 12/10/19 0548  WBC 10.1  --  7.6 10.8* 10.2  NEUTROABS 7.4  --   --   --   --   HGB 13.0 13.9 12.4 13.3 14.9  HCT 42.1 41.0 39.9 42.9 48.0*  MCV 105.3*   --  103.6* 104.6* 102.6*  PLT 276  --  257 268 619    Basic Metabolic Panel: Recent Labs  Lab 12/05/19 1259 12/05/19 1343 12/07/19 0344 12/08/19 0413 12/09/19 0555 12/10/19 0548 12/11/19 0308  NA 143   < > 140 141 139 141 139  K 4.6   < > 4.1 3.7 3.9 3.6 4.1  CL 102   < > 96* 97* 94* 94* 94*  CO2 29   < > 31 33* 32 33* 33*  GLUCOSE 152*   < > 136* 106* 149* 119* 113*  BUN 15   < > 33* 33* 33* 35* 34*  CREATININE 1.24*   < > 1.45* 1.69* 1.33* 1.43* 1.45*  CALCIUM 9.0   < > 9.0 8.9 9.1 9.2 9.0  MG 1.9  --   --  2.1 2.1  --  2.2  PHOS 3.1  --   --   --   --   --   --    < > = values in this interval not displayed.    GFR: Estimated Creatinine Clearance: 31.9 mL/min (A) (by C-G formula based on SCr of 1.45 mg/dL (H)).  Liver Function Tests: Recent Labs  Lab 12/05/19 1259  AST 19  ALT 14  ALKPHOS 97  BILITOT 0.7  PROT 6.4*  ALBUMIN 3.6    CBG: No results for input(s): GLUCAP in the last 168 hours.   Recent Results (from the past 240 hour(s))  Culture, blood (routine x 2)     Status: None   Collection Time: 12/05/19 12:59 PM   Specimen: BLOOD RIGHT FOREARM  Result Value Ref Range Status   Specimen Description BLOOD RIGHT FOREARM  Final   Special Requests   Final    BOTTLES DRAWN AEROBIC AND ANAEROBIC Blood Culture adequate volume   Culture   Final    NO GROWTH 5 DAYS Performed at [facility], [address] [place], [place] [address]    Report Status 12/10/2019 FINAL  Final  SARS Coronavirus 2 by RT PCR (hospital order, performed in [facility] hospital lab) Nasopharyngeal Nasopharyngeal Swab     Status: None   Collection Time: 12/05/19 12:59 PM   Specimen: Nasopharyngeal Swab  Result Value Ref Range Status   SARS Coronavirus 2 NEGATIVE NEGATIVE Final    Comment: (NOTE) SARS-CoV-2 target nucleic acids are NOT DETECTED.  The SARS-CoV-2 RNA is generally detectable in upper and lower respiratory specimens during the acute phase of infection. The  lowest concentration of SARS-CoV-2 viral copies this assay can detect is 250 copies / mL. A negative result does not preclude SARS-CoV-2 infection and should not be used as the sole basis for treatment or other patient management decisions.  A negative result may occur with improper specimen collection / handling, submission of specimen other than nasopharyngeal swab, presence of viral mutation(s) within the areas targeted by this assay, and inadequate number of viral copies (<250 copies / mL). A negative result must be combined with clinical observations, patient history, and epidemiological information.  Fact Sheet for Patients:   StrictlyIdeas.no  Fact Sheet for Healthcare Providers: BankingDealers.co.za  This test is not yet approved or  cleared by the [place] FDA and has been authorized for detection and/or diagnosis of SARS-CoV-2 by FDA under an Emergency Use Authorization (EUA).  This EUA will remain in effect (meaning this test can be used) for the duration of the COVID-19 declaration under Section 564(b)(1) of the Act, 21 U.S.C. section 360bbb-3(b)(1), unless the authorization is terminated or revoked sooner.  Performed at [facility], [place] [address] [place], [place] [address]   Culture, blood (routine x 2)     Status: None   Collection Time: 12/05/19  3:06 PM   Specimen: BLOOD RIGHT HAND  Result Value Ref Range Status   Specimen Description BLOOD RIGHT HAND  Final   Special Requests   Final    BOTTLES DRAWN AEROBIC AND ANAEROBIC Blood Culture results may not be optimal due to an inadequate volume of blood received in culture bottles   Culture   Final    NO GROWTH 5 DAYS Performed at [facility], [place] [address] [place], [place] [address]    Report Status 12/10/2019 FINAL  Final  MRSA PCR Screening     Status: Abnormal  Collection Time: 12/07/19  9:25 PM   Specimen: Nasal Mucosa; Nasopharyngeal   Result Value Ref Range Status   MRSA by PCR POSITIVE (A) NEGATIVE Final    Comment:        The GeneXpert MRSA Assay (FDA approved for NASAL specimens only), is one component of a comprehensive MRSA colonization surveillance program. It is not intended to diagnose MRSA infection nor to guide or monitor treatment for MRSA infections. RESULT CALLED TO, READ BACK BY AND VERIFIED WITH: Gwen Her RN 12/08/19 0421 JDW Performed at [facility], [place] [address] [place], [place] [address]          Radiology Studies: No results found.      Scheduled Meds: . allopurinol  300 mg Oral Daily  . ALPRAZolam  1 mg Oral Daily  . amiodarone  200 mg Oral BID  . aspirin  81 mg Oral Daily  . azelastine  2 spray Each Nare BID  . bisoprolol  5 mg Oral Daily  . Chlorhexidine Gluconate Cloth  6 each Topical Q0600  . clopidogrel  75 mg Oral Daily  . DULoxetine  60 mg Oral BID  . enoxaparin (LOVENOX) injection  40 mg Subcutaneous Q24H  . guaiFENesin  1,200 mg Oral BID  . loratadine  10 mg Oral Daily  . magnesium oxide  400 mg Oral Daily  . mouth rinse  15 mL Mouth Rinse BID  . mupirocin ointment  1 application Nasal BID  . pantoprazole  40 mg Oral Q0600  . sodium chloride flush  3 mL Intravenous Q12H  . spironolactone  12.5 mg Oral Daily  . torsemide  60 mg Oral BID   Continuous Infusions: . sodium chloride       LOS: 6 days    Time spent: 35 minutes    Irine Seal, MD [facility]   To contact the attending provider between 7A-7P or the covering provider during after hours 7P-7A, please log into the web site [email or web address] and access using universal [place] password for that web site. If you do not have the password, please call the hospital operator.  12/11/2019, 11:06 AM

## 2019-12-11 NOTE — TOC Progression Note (Signed)
Transition of Care [facility]) - Progression Note    Patient Details  Name: Sydney Phillips MRN: [id number] Date of Birth: [birth year]  Transition of Care [facility]) CM/SW [place], [place] Phone Number: 12/11/2019, 10:34 AM  Clinical Narrative:     CSW visit with patient to inform of bed offers. Patient requested to contact her son, Sydney Phillips. CSW spoke with patient via phone and informed of bed offers. [name] requested to leave offers (medicare.gove SNF listing w/ratings) in the room with the patient and he will review. CSW encourage patient's son to make decision as soon as possible anticipated discharge is tomorrow,pending insurance approval & placement.  Insurance remains pending-RN informed covid test is needed  Thurmond Butts, MSW, LCSWA Clinical Social Worker   Expected Discharge Plan: Skilled Nursing Facility Barriers to Discharge: Ship broker, SNF Pending bed offer  Expected Discharge Plan and Services Expected Discharge Plan: [place] In-house Referral: NA Discharge Planning Services: CM Consult Post Acute Care Choice: [place] arrangements for the past 2 months: Single Family Home Expected Discharge Date: 12/09/19                 DME Agency: NA       HH Arranged: RN, Disease Management, PT [place] Agency: [place] Date [facility] Agency Contacted: 12/08/19 Time [place]: 1519 Representative spoke with at [place]: [place] ([place]) Interventions    Readmission Risk Interventions Readmission Risk Prevention Plan 12/08/2019 12/08/2019  Transportation Screening Complete Complete  PCP or Specialist Appt within 3-5 Days Complete -  HRI or Home Care Consult Complete -  Social Work Consult for [place] Planning/Counseling Complete Complete  Palliative Care Screening Not Applicable Not Applicable  Medication Review Press photographer) Complete Complete  Some recent data might  be hidden

## 2019-12-12 ENCOUNTER — Telehealth: Payer: Self-pay | Admitting: Family Medicine

## 2019-12-12 ENCOUNTER — Inpatient Hospital Stay (HOSPITAL_COMMUNITY): Payer: Medicare Other

## 2019-12-12 LAB — BASIC METABOLIC PANEL
Anion gap: 12 (ref 5–15)
BUN: 38 mg/dL — ABNORMAL HIGH (ref 8–23)
CO2: 32 mmol/L (ref 22–32)
Calcium: 8.9 mg/dL (ref 8.9–10.3)
Chloride: 96 mmol/L — ABNORMAL LOW (ref 98–111)
Creatinine, Ser: 1.47 mg/dL — ABNORMAL HIGH (ref 0.44–1.00)
GFR calc Af Amer: 38 mL/min — ABNORMAL LOW (ref >60)
GFR calc non Af Amer: 33 mL/min — ABNORMAL LOW (ref >60)
Glucose, Bld: 102 mg/dL — ABNORMAL HIGH (ref 70–99)
Potassium: 3.7 mmol/L (ref 3.5–5.1)
Sodium: 140 mmol/L (ref 135–145)

## 2019-12-12 NOTE — Consult Note (Signed)
   [facility] Inpatient Consult   12/12/2019  Sydney Phillips 03/23/1936 [id number]   [place] [facility] [ACO] Patient:  [name]  Patient screened for high risk score for unplanned readmission score and for length of stay hospitalization.  Review of patient's medical record reveals patient is for a short rehab skilled nursing facility stay.  Primary Care Provider is  Alysia Penna, MDthis provider is llisted to provide the transition of care [TOC] for post hospital follow up.  Plan: No current [facility] Care Management needs as patient needs is to be met at a skilled nursing facility.   For questions contact:   Natividad Brood, RN BSN [facility] Liaison  [phone] business mobile phone Toll free office [phone]  Fax number: [phone] [name]

## 2019-12-12 NOTE — Care Management Important Message (Signed)
Important Message  Patient Details  Name: Sydney Phillips MRN: [id number] Date of Birth: 08/07/1935   Medicare Important Message Given:  Yes     Shelda Altes 12/12/2019, 9:45 AM

## 2019-12-12 NOTE — TOC Progression Note (Addendum)
Transition of Care [facility]) - Progression Note    Patient Details  Name: Sydney Phillips MRN: [id number] Date of Birth: 18-Nov-1935  Transition of Care [facility]) CM/SW Contact  Zenon Mayo, RN Phone Number: 12/12/2019, 4:11 PM  Clinical Narrative:    NCM received call from [place] stating that son would like for patient to come hom now and not go to SNF.  NCM contacted the son to confirm.  He said yes he would like for her to continue with the [facility] services she was set up with before and if she is ready he would like to transport her home today. NCM notified MD of this information. Will call Malachy Mood with [facility] to notify of discharge. MD will discharge patient. Soc will be on Wed.  Expected Discharge Plan: Skilled Nursing Facility Barriers to Discharge: Ship broker, SNF Pending bed offer  Expected Discharge Plan and Services Expected Discharge Plan: [place] In-house Referral: NA Discharge Planning Services: CM Consult Post Acute Care Choice: [place] arrangements for the past 2 months: Single Family Home Expected Discharge Date: 12/09/19                 DME Agency: NA       HH Arranged: RN, Disease Management, PT HH Agency: [place] Date [facility] Agency Contacted: 12/08/19 Time [place]: 1519 Representative spoke with at [place]: [place] ([place]) Interventions    Readmission Risk Interventions Readmission Risk Prevention Plan 12/08/2019 12/08/2019  Transportation Screening Complete Complete  PCP or Specialist Appt within 3-5 Days Complete -  HRI or Home Care Consult Complete -  Social Work Consult for [place] Planning/Counseling Complete Complete  Palliative Care Screening Not Applicable Not Applicable  Medication Review Press photographer) Complete Complete  Some recent data might be hidden

## 2019-12-12 NOTE — Telephone Encounter (Signed)
Tiffany from [facility] called to let us know that the patient was admitted to the hospital today.

## 2019-12-12 NOTE — TOC Transition Note (Signed)
Transition of Care [facility]) - CM/SW Discharge Note   Patient Details  Name: Sydney Phillips MRN: [id number] Date of Birth: [birth year]  Transition of Care [facility]) CM/SW Contact:  Zenon Mayo, RN Phone Number: 12/12/2019, 4:49 PM   Clinical Narrative:    Patient is for dc today, NCM spoke with son [name], he is on his way to transport patient home. NCM notified Malachy Mood with [facility] that patient is for dc today and needs HHRN, [place], [place], [place], and HHST. She states they can do this for patient. Soc on Wed.   Final next level of care: [place] Barriers to Discharge: No Barriers Identified   Patient Goals and CMS Choice Patient states their goals for this hospitalization and ongoing recovery are:: to be able to get up CMS Medicare.gov Compare Post Acute Care list provided to:: Patient Represenative (must comment) Choice offered to / list presented to : Adult Children  Discharge Placement                       Discharge Plan and Services In-house Referral: NA Discharge Planning Services: CM Consult Post Acute Care Choice: Home Health            DME Agency: NA       HH Arranged: RN, Disease Management, PT, Nurse's Aide, Speech Therapy, Social Work [employer] Agency: [place] Date HH Agency Contacted: 12/12/19 Time [place]: [place] Representative spoke with at [place]: [place] ([place]) Interventions     Readmission Risk Interventions Readmission Risk Prevention Plan 12/08/2019 12/08/2019  Transportation Screening Complete Complete  PCP or Specialist Appt within 3-5 Days Complete -  HRI or Home Care Consult Complete -  Social Work Consult for [place] Planning/Counseling Complete Complete  Palliative Care Screening Not Applicable Not Applicable  Medication Review Press photographer) Complete Complete  Some recent data might be hidden

## 2019-12-12 NOTE — Telephone Encounter (Signed)
Noted. Will send to Dr. Fry as FYI 

## 2019-12-12 NOTE — Progress Notes (Addendum)
PROGRESS NOTE    Sydney Phillips  TOI:[id number] DOB: [birth year] DOA: 12/05/2019 PCP: Laurey Morale, MD    Chief Complaint  Patient presents with  . Respiratory Distress    Brief Narrative:  84 year old lady prior history of coronary artery disease s/p PCI, ischemic cardiomyopathy chronic systolic heart failure, left ventricular ejection fraction of 20 to 25%, stage IIl CKD, severe COPD on 2 L of nasal cannula oxygen, hypertension, type 2 diabetes, history of VT/VF s/p AICD, peripheral vascular disease, obstructive sleep apnea, hyperlipidemia, [place] to ED with complaints of shortness of breath since Monday.  On arrival to ED patient was lethargic and required BiPAP.  While in ED she was given IV Solu-Medrol and duo nebs and referred to [facility] for admission.  Chest x-ray showed mild CHF.  ABG on admission revealed pH of 7.4, PCO2 53, PO2 of 94.  She was started on IV Lasix and cardiology consulted for further evaluation.  Patient seen and examined this morning, she was weaned off BiPAP and transition to 3 L of nasal cannula oxygen.    Assessment & Plan:   Principal Problem:   Acute on chronic respiratory failure with hypoxia and hypercapnia (HCC) Active Problems:   COPD (chronic obstructive pulmonary disease) (HCC)   PVD,   CAD S/P percutaneous coronary angioplasty   CKD (chronic kidney disease), stage III   V-tach (HCC)   Acute on chronic congestive heart failure (HCC)   1 acute on chronic respiratory failure with hypoxia and hypercapnia secondary to acute on chronic combined systolic diastolic heart failure Patient had presented with shortness of breath, in the setting of a history of severe COPD on 2 L nasal cannula, noted EF of 20 to 25%, left ventricle global hypokinesis, grade 2 diastolic dysfunction (2D echo 08/24/2019 )and placed on IV Lasix.  BNP on admission elevated at 1385.6.  Cardiac enzymes minimally elevated but flattened.  Patient initially was requiring BiPAP and has  been transitioned to 3 L nasal cannula.  Patient noted to be refusing BiPAP at bedtime.  BiPAP has been discontinued.  Urine output of 1.9 L over the past 24 hours.  Patient is -9.285 L during this hospitalization.  Patient clinically improved and has been transitioned from IV diuretics back to oral torsemide 60 mg twice daily.  Patient also on spironolactone, bisoprolol, aspirin.  DC BiPAP.  Outpatient follow-up with cardiology.  2.  COPD on chronic O2 2 L Stable.  Patient with complaints of a cough and subsequently has been started on Mucinex with clinical improvement.  Continue Claritin, duo nebs, PPI.    3.  History of coronary artery disease status post cath 10/24/2019 Medical management recommended from recent cath per cardiology.  Continue aspirin, bisoprolol, spironolactone, Demadex, Plavix.  Outpatient follow-up with cardiology.   4.  Hypertension Stable.  5.  Chronic kidney disease stage IIIb Stable.  Monitor with diuresis.  6.  History of V. Tach/VF status post AICD Keep magnesium > 2.  Keep potassium > 4.  Continue amiodarone.  Per cardiology normal device function with 98% biventricular pacing.  Device readjusted per cardiology due to intermittent loss of LV capture. Per cardiology.  7.  History of peripheral vascular disease status post left subclavian stent Continue aspirin and Plavix.  Outpatient follow-up.    8.  Hyperlipidemia Per cardiology patient noted to have been tried on numerous statins which was stopped due to side effects.  Per cardiology patient has declined PCSK9 inhibitor.  Outpatient follow-up.  9.  Choking spell  Patient noted to have an episode of choking spell while eating yesterday.  Patient seen by speech therapy patient placed on a dysphagia 3 diet with thin liquids.  Patient for MBS today.   DVT prophylaxis: Lovenox Code Status: Full Family Communication: Updated patient.  No family at bedside. Disposition:   Status is: Inpatient    Dispo: The  patient is from: Home              Anticipated d/c is to: SNF              Anticipated d/c date is: 12/13/2019              Patient medically stable awaiting SNF placement.         Consultants:   Cardiology: Dr. Sallyanne Kuster 12/05/2019  Procedures:   Chest x-ray 12/05/2019    Antimicrobials:   None   Subjective: Patient sitting up in bed reading a book.  Denies chest pain.  Denies any significant shortness of breath.  Denies any further choking episodes.  Feels better.    Objective: Vitals:   12/12/19 0056 12/12/19 0414 12/12/19 0600 12/12/19 0740  BP: (!) 142/62 (!) 141/67  133/74  Pulse: 60 (!) 59  60  Resp: 19 19  18   Temp: 97.9 F (36.6 C) 98.6 F (37 C)  97.8 F (36.6 C)  TempSrc: Oral Oral  Oral  SpO2: 100% 98%  100%  Weight:   81 kg   Height:        Intake/Output Summary (Last 24 hours) at 12/12/2019 1044 Last data filed at 12/12/2019 0841 Gross per 24 hour  Intake 800 ml  Output 1300 ml  Net -500 ml   Filed Weights   12/10/19 0518 12/11/19 0355 12/12/19 0600  Weight: 80.5 kg 80.6 kg 81 kg    Examination:  General exam: NAD. Respiratory system: Decreased bibasilar crackles.  No wheezing.  Normal respiratory effort.  Speaking in full sentences.  Cardiovascular system: RRR with 2/6 holosystolic murmur in the left lower sternal border.  No lower extremity edema.  Gastrointestinal system: Abdomen is nontender, nondistended, soft, positive bowel sounds.  No rebound.  No guarding.   Central nervous system: Alert and oriented.  No focal neurological deficits.  Extremities: Symmetric 5 x 5 power. Skin: No rashes, lesions or ulcers Psychiatry: Judgement and insight appear normal. Mood & affect appropriate.     Data Reviewed: I have personally reviewed following labs and imaging studies  CBC: Recent Labs  Lab 12/05/19 1259 12/05/19 1343 12/06/19 0553 12/08/19 0413 12/10/19 0548  WBC 10.1  --  7.6 10.8* 10.2  NEUTROABS 7.4  --   --   --   --   HGB  13.0 13.9 12.4 13.3 14.9  HCT 42.1 41.0 39.9 42.9 48.0*  MCV 105.3*  --  103.6* 104.6* 102.6*  PLT 276  --  257 268 093    Basic Metabolic Panel: Recent Labs  Lab 12/05/19 1259 12/05/19 1343 12/08/19 0413 12/09/19 0555 12/10/19 0548 12/11/19 0308 12/12/19 0359  NA 143   < > 141 139 141 139 140  K 4.6   < > 3.7 3.9 3.6 4.1 3.7  CL 102   < > 97* 94* 94* 94* 96*  CO2 29   < > 33* 32 33* 33* 32  GLUCOSE 152*   < > 106* 149* 119* 113* 102*  BUN 15   < > 33* 33* 35* 34* 38*  CREATININE 1.24*   < > 1.69* 1.33*  1.43* 1.45* 1.47*  CALCIUM 9.0   < > 8.9 9.1 9.2 9.0 8.9  MG 1.9  --  2.1 2.1  --  2.2  --   PHOS 3.1  --   --   --   --   --   --    < > = values in this interval not displayed.    GFR: Estimated Creatinine Clearance: 31.6 mL/min (A) (by C-G formula based on SCr of 1.47 mg/dL (H)).  Liver Function Tests: Recent Labs  Lab 12/05/19 1259  AST 19  ALT 14  ALKPHOS 97  BILITOT 0.7  PROT 6.4*  ALBUMIN 3.6    CBG: No results for input(s): GLUCAP in the last 168 hours.   Recent Results (from the past 240 hour(s))  Culture, blood (routine x 2)     Status: None   Collection Time: 12/05/19 12:59 PM   Specimen: BLOOD RIGHT FOREARM  Result Value Ref Range Status   Specimen Description BLOOD RIGHT FOREARM  Final   Special Requests   Final    BOTTLES DRAWN AEROBIC AND ANAEROBIC Blood Culture adequate volume   Culture   Final    NO GROWTH 5 DAYS Performed at [facility], [address], [place], [place] [address]    Report Status 12/10/2019 FINAL  Final  SARS Coronavirus 2 by RT PCR (hospital order, performed in [facility] hospital lab) Nasopharyngeal Nasopharyngeal Swab     Status: None   Collection Time: 12/05/19 12:59 PM   Specimen: Nasopharyngeal Swab  Result Value Ref Range Status   SARS Coronavirus 2 NEGATIVE NEGATIVE Final    Comment: (NOTE) SARS-CoV-2 target nucleic acids are NOT DETECTED.  The SARS-CoV-2 RNA is generally detectable in upper and  lower respiratory specimens during the acute phase of infection. The lowest concentration of SARS-CoV-2 viral copies this assay can detect is 250 copies / mL. A negative result does not preclude SARS-CoV-2 infection and should not be used as the sole basis for treatment or other patient management decisions.  A negative result may occur with improper specimen collection / handling, submission of specimen other than nasopharyngeal swab, presence of viral mutation(s) within the areas targeted by this assay, and inadequate number of viral copies (<250 copies / mL). A negative result must be combined with clinical observations, patient history, and epidemiological information.  Fact Sheet for Patients:   StrictlyIdeas.no  Fact Sheet for Healthcare Providers: BankingDealers.co.za  This test is not yet approved or  cleared by the [place] FDA and has been authorized for detection and/or diagnosis of SARS-CoV-2 by FDA under an Emergency Use Authorization (EUA).  This EUA will remain in effect (meaning this test can be used) for the duration of the COVID-19 declaration under Section 564(b)(1) of the Act, 21 U.S.C. section 360bbb-3(b)(1), unless the authorization is terminated or revoked sooner.  Performed at [facility], [place] [address], [place], [place] [address]   Culture, blood (routine x 2)     Status: None   Collection Time: 12/05/19  3:06 PM   Specimen: BLOOD RIGHT HAND  Result Value Ref Range Status   Specimen Description BLOOD RIGHT HAND  Final   Special Requests   Final    BOTTLES DRAWN AEROBIC AND ANAEROBIC Blood Culture results may not be optimal due to an inadequate volume of blood received in culture bottles   Culture   Final    NO GROWTH 5 DAYS Performed at [facility], [place]  [address] [place], [place] [address]    Report Status 12/10/2019 FINAL  Final  MRSA PCR Screening     Status: Abnormal   Collection  Time: 12/07/19  9:25 PM   Specimen: Nasal Mucosa; Nasopharyngeal  Result Value Ref Range Status   MRSA by PCR POSITIVE (A) NEGATIVE Final    Comment:        The GeneXpert MRSA Assay (FDA approved for NASAL specimens only), is one component of a comprehensive MRSA colonization surveillance program. It is not intended to diagnose MRSA infection nor to guide or monitor treatment for MRSA infections. RESULT CALLED TO, READ BACK BY AND VERIFIED WITH: Gwen Her RN 12/08/19 0421 JDW Performed at [facility], [place] [address], [place], [place] [address]   SARS CORONAVIRUS 2 (TAT 6-24 HRS) Nasopharyngeal Nasopharyngeal Swab     Status: None   Collection Time: 12/11/19 10:49 AM   Specimen: Nasopharyngeal Swab  Result Value Ref Range Status   SARS Coronavirus 2 NEGATIVE NEGATIVE Final    Comment: (NOTE) SARS-CoV-2 target nucleic acids are NOT DETECTED.  The SARS-CoV-2 RNA is generally detectable in upper and lower respiratory specimens during the acute phase of infection. Negative results do not preclude SARS-CoV-2 infection, do not rule out co-infections with other pathogens, and should not be used as the sole basis for treatment or other patient management decisions. Negative results must be combined with clinical observations, patient history, and epidemiological information. The expected result is Negative.  Fact Sheet for Patients: SugarRoll.be  Fact Sheet for Healthcare Providers: https://www.woods-mathews.com/  This test is not yet approved or cleared by the [place] FDA and  has been authorized for detection and/or diagnosis of SARS-CoV-2 by FDA under an Emergency Use Authorization (EUA). This EUA will remain  in effect (meaning this test can be used) for the duration of the COVID-19 declaration under Se ction 564(b)(1) of the Act, 21 U.S.C. section 360bbb-3(b)(1), unless the authorization is terminated or revoked  sooner.  Performed at [facility], [place] [address], [place], [place] [address]          Radiology Studies: No results found.      Scheduled Meds: . allopurinol  300 mg Oral Daily  . ALPRAZolam  1 mg Oral Daily  . amiodarone  200 mg Oral BID  . aspirin  81 mg Oral Daily  . azelastine  2 spray Each Nare BID  . bisoprolol  5 mg Oral Daily  . clopidogrel  75 mg Oral Daily  . DULoxetine  60 mg Oral BID  . enoxaparin (LOVENOX) injection  40 mg Subcutaneous Q24H  . guaiFENesin  1,200 mg Oral BID  . loratadine  10 mg Oral Daily  . magnesium oxide  400 mg Oral Daily  . mouth rinse  15 mL Mouth Rinse BID  . mupirocin ointment  1 application Nasal BID  . pantoprazole  40 mg Oral Q0600  . sodium chloride flush  3 mL Intravenous Q12H  . spironolactone  12.5 mg Oral Daily  . torsemide  60 mg Oral BID   Continuous Infusions: . sodium chloride       LOS: 7 days    Time spent: 35 minutes    Irine Seal, MD [facility]   To contact the attending provider between 7A-7P or the covering provider during after hours 7P-7A, please log into the web site [email or web address] and access using universal [place] password for that web site. If you do not have the password, please call  the hospital operator.  12/12/2019, 10:44 AM

## 2019-12-12 NOTE — TOC Progression Note (Signed)
Transition of Care [facility]) - Progression Note    Patient Details  Name: SHAREKA CASALE MRN: [id number] Date of Birth: 03-14-1936  Transition of Care [facility]) CM/SW [place], [place] Phone Number: 12/12/2019, 2:48 PM  Clinical Narrative:    Patient has been approved for insurance auth plan ID [id number] and Navi ID [id number].  Josem Kaufmann is for 3 days starting 6/28 for 3 days.  CSW will continue to follow for disposition needs.   Expected Discharge Plan: Skilled Nursing Facility Barriers to Discharge: Ship broker, SNF Pending bed offer  Expected Discharge Plan and Services Expected Discharge Plan: [place] In-house Referral: NA Discharge Planning Services: CM Consult Post Acute Care Choice: [place] arrangements for the past 2 months: Single Family Home Expected Discharge Date: 12/09/19                 DME Agency: NA       HH Arranged: RN, Disease Management, PT HH Agency: [place] Date [facility] Agency Contacted: 12/08/19 Time [place]: 1519 Representative spoke with at [place]: [place] ([place]) Interventions    Readmission Risk Interventions Readmission Risk Prevention Plan 12/08/2019 12/08/2019  Transportation Screening Complete Complete  PCP or Specialist Appt within 3-5 Days Complete -  HRI or Home Care Consult Complete -  Social Work Consult for [place] Planning/Counseling Complete Complete  Palliative Care Screening Not Applicable Not Applicable  Medication Review Press photographer) Complete Complete  Some recent data might be hidden

## 2019-12-12 NOTE — Consult Note (Signed)
Modified Barium Swallow Progress Note  Patient Details  Name: Sydney Phillips MRN: [id number] Date of Birth: [birth year]  Today's Date: 12/12/2019  Modified Barium Swallow completed.  Full report located under Chart Review in the Imaging Section.  Brief recommendations include the following:  Clinical Impression  Pt exhibits a normal oropharyngeal swallow during MBS; pt did state she felt "bubbles in her throat" indicating possible globus sensation during meals infrequently eliciting coughing intermittently; ST will f/u x1 for diet tolerance with a meal to offer potential compensatory strategies related to varied sensation/esophageal symptoms during intake; continue Dysphagia 3/thin liquids d/t edentulous state and mild risk for aspiration d/t dysphagia complaints and compromised respiratory status.   Swallow Evaluation Recommendations       SLP Diet Recommendations: Dysphagia 3 (Mech soft) solids;Thin liquid   Liquid Administration via: Cup;Straw   Medication Administration: Whole meds with liquid   Supervision: Patient able to self feed;Intermittent supervision to cue for compensatory strategies   Compensations: Slow rate;Small sips/bites;Follow solids with liquid   Postural Changes: Remain semi-upright after after feeds/meals (Comment)   Oral Care Recommendations: Oral care BID        Elvina Sidle, M.S., CCC-SLP 12/12/2019,1:36 PM

## 2019-12-13 ENCOUNTER — Telehealth: Payer: Self-pay | Admitting: *Deleted

## 2019-12-13 NOTE — Telephone Encounter (Signed)
Transition Care Management Follow-up Telephone Call   Date discharged? 12/09/2019   How have you been since you were released from the hospital?  I'm doing pretty good    Do you understand why you were in the hospital? yes   Do you understand the discharge instructions? yes   Where were you discharged to? Home    Items Reviewed:  Medications reviewed: yes  Allergies reviewed: yes  Dietary changes reviewed: N/A   Referrals reviewed: yes Home Health    Functional Questionnaire:   Activities of Daily Living (ADLs):   She states they are independent in the following: bathing and hygiene, feeding, continence, grooming and dressing States they require assistance with the following: ambulation and toileting   Any transportation issues/concerns?: no   Any patient concerns? no   Confirmed importance and date/time of follow-up visits scheduled yes  Provider Appointment booked with Dr. Sarajane Jews 12/16/2019 at 10:15 AM   Confirmed with patient if condition begins to worsen call PCP or go to the ER.  Patient was given the office number and encouraged to call back with question or concerns.  : yes

## 2019-12-16 ENCOUNTER — Ambulatory Visit: Payer: Medicare Other

## 2019-12-16 ENCOUNTER — Inpatient Hospital Stay: Payer: Self-pay | Admitting: Family Medicine

## 2019-12-16 DIAGNOSIS — F419 Anxiety disorder, unspecified: Secondary | ICD-10-CM

## 2019-12-16 DIAGNOSIS — N1832 Chronic kidney disease, stage 3b: Secondary | ICD-10-CM

## 2019-12-16 DIAGNOSIS — E1122 Type 2 diabetes mellitus with diabetic chronic kidney disease: Secondary | ICD-10-CM

## 2019-12-16 DIAGNOSIS — M797 Fibromyalgia: Secondary | ICD-10-CM

## 2019-12-16 DIAGNOSIS — Z9861 Coronary angioplasty status: Secondary | ICD-10-CM

## 2019-12-16 DIAGNOSIS — I472 Ventricular tachycardia, unspecified: Secondary | ICD-10-CM

## 2019-12-16 DIAGNOSIS — I5042 Chronic combined systolic (congestive) and diastolic (congestive) heart failure: Secondary | ICD-10-CM

## 2019-12-16 DIAGNOSIS — I10 Essential (primary) hypertension: Secondary | ICD-10-CM

## 2019-12-16 DIAGNOSIS — M1A9XX0 Chronic gout, unspecified, without tophus (tophi): Secondary | ICD-10-CM

## 2019-12-16 DIAGNOSIS — G4733 Obstructive sleep apnea (adult) (pediatric): Secondary | ICD-10-CM

## 2019-12-16 DIAGNOSIS — E785 Hyperlipidemia, unspecified: Secondary | ICD-10-CM

## 2019-12-16 DIAGNOSIS — J310 Chronic rhinitis: Secondary | ICD-10-CM

## 2019-12-16 NOTE — Chronic Care Management (AMB) (Signed)
Chronic Care Management Pharmacy  Name: Sydney Phillips  MRN: [id number] DOB: June 29, 1935  Initial Questions: 1. Have you seen any other providers since your last visit? NA 2. Any changes in your medicines or health? No   Chief Complaint/ HPI  Sydney Phillips,  84 y.o. , female presents for their Initial CCM visit with the clinical pharmacist In office.  Patient presented to office visit with son. She requested for medication review to emphasize reason she is taking the medications and how she is supposed to take them.   PCP : Laurey Morale, MD  Their chronic conditions include: COPD, Diabetes, V tach, HF, HTN, HLD, CAD, Gout, Fibromyalgia, Anxiety, GERD, Vitamin B12 supplementation, Vitamin D supplementation, Allergies/ congestion, CKD stage III   Office Visits: 03/10/2019- Colin Benton, DO- Patient presented for annual wellness exam.   03/09/2019- Alysia Penna, MD- Patient presented for office visit for complaint of leg pains. Patient to use Norco PRN.   Consult Visit: 06/[facility] [facility]- Patient presented for complaint of shortness of breath. Patient was given IV Lasix for volume overload.  Recommend on BMP on f/u visit and TSH, free T4 done in 1 to 2 monts  11/10/2019- Cardiology- Tommye Standard, PA- Patient presented for office visit for follow up on labs and medicines. Patient instructed to stop spironolactone and to obtain labs. Patient to follow up in 2 weeks or sooner if needed    11/02/2019- Cardiology- Barrington Ellison, PA- Patient presented for office visit for routine ICD follow up. No major interventions done. Patient to obtain BMET and follow up in 4 weeks.   09/26/2019- Cardiology- Allegra Lai, MD- Patient presented for office visit for evaluation of CHF. Bisoprolol increased to 68m. Patient to hold off antiarrhythmic medications. Patient to follow up in 6 months.   09/13/2019- Cardiology- MSanda Klein MD- Patient presented for virtual visit for VT. Patient  referred to EP for consultation on antiarrhythmic medications.   Medications: Outpatient Encounter Medications as of 12/16/2019  Medication Sig  . ACCU-CHEK FASTCLIX LANCETS MISC USE   TO CHECK GLUCOSE ONCE DAILY (Patient taking differently: 1 each by Other route daily. )  . ACCU-CHEK SMARTVIEW test strip USE  STRIP TO CHECK GLUCOSE ONCE DAILY (Patient taking differently: 1 each by Other route daily. )  . albuterol (PROVENTIL) (2.5 MG/3ML) 0.083% nebulizer solution USE 1 VIAL IN NEBULIZER EVERY 4 HOURS AS NEEDED FOR WHEEZING FOR SHORTNESS OF BREATH (Patient taking differently: Take 2.5 mg by nebulization every 4 (four) hours as needed for wheezing or shortness of breath. )  . allopurinol (ZYLOPRIM) 300 MG tablet Take 1 tablet by mouth once daily (Patient taking differently: Take 300 mg by mouth daily. )  . ALPRAZolam (XANAX) 1 MG tablet TAKE 1 TABLET BY MOUTH IN THE MORNING AND 1 & 1/2 (ONE & ONE-HALF) NIGHTLY AS NEEDED FOR ANXIETY (Patient taking differently: Take 1 mg by mouth See admin instructions. 1561mby mouth qam and 1.61m54my mough every night as needed for anxiety.)  . amiodarone (PACERONE) 200 MG tablet Take 1 tablet (200 mg total) by mouth 2 (two) times daily.  . Ascorbic Acid (VITAMIN C PO) Take 1 tablet by mouth in the morning and at bedtime.  . aMarland Kitchenpirin 81 MG chewable tablet Chew 1 tablet (81 mg total) by mouth daily.  . aMarland Kitchenelastine (ASTELIN) 0.1 % nasal spray USE 2 SPRAY(S) IN EACH NOSTRIL TWICE DAILY AS DIRECTED (Patient taking differently: Place 2 sprays into both nostrils 2 (two) times daily. )  .  Biotin 5000 MCG CAPS Take 1 capsule by mouth daily.  . bisoprolol (ZEBETA) 5 MG tablet Take 1 tablet (5 mg total) by mouth daily.  . Cholecalciferol (VITAMIN D) 50 MCG (2000 UT) CAPS Take 4,000 Units by mouth daily.   . clopidogrel (PLAVIX) 75 MG tablet Take 1 tablet by mouth once daily  . Cyanocobalamin 5000 MCG TBDP Take 5,000 mcg by mouth daily.   . DULoxetine (CYMBALTA) 60 MG capsule Take  1 capsule (60 mg total) by mouth 2 (two) times daily.  Marland Kitchen guaiFENesin (MUCINEX) 600 MG 12 hr tablet Take 2 tablets (1,200 mg total) by mouth 2 (two) times daily as needed.  . loratadine (CLARITIN) 10 MG tablet Take 10 mg by mouth daily.  . Magnesium Oxide 400 MG CAPS Take 1 capsule (400 mg total) by mouth daily. (Patient taking differently: Take 250 mg by mouth in the morning and at bedtime. Magnesium citrate)  . PROAIR HFA 108 (90 Base) MCG/ACT inhaler INHALE 2 PUFFS BY MOUTH EVERY 4 HOURS AS NEEDED FOR WHEEZING AND FOR SHORTNESS OF BREATH (Patient taking differently: Inhale 2 puffs into the lungs every 4 (four) hours as needed for wheezing or shortness of breath. )  . spironolactone (ALDACTONE) 25 MG tablet Take 0.5 tablets (12.5 mg total) by mouth daily.  . [DISCONTINUED] torsemide (DEMADEX) 20 MG tablet Take 3 tablets (60 mg total) by mouth 2 (two) times daily.  . [EXPIRED] mupirocin ointment (BACTROBAN) 2 % Place 1 application into the nose 2 (two) times daily for 4 days.  . nitroGLYCERIN (NITROSTAT) 0.4 MG SL tablet Place 1 tablet (0.4 mg total) under the tongue every 5 (five) minutes as needed for chest pain. X 3 doses (Patient not taking: Reported on 12/16/2019)  . pantoprazole (PROTONIX) 40 MG tablet Take 1 tablet (40 mg total) by mouth daily at 6 (six) AM. (Patient not taking: Reported on 12/16/2019)   No facility-administered encounter medications on file as of 12/16/2019.   Current Diagnosis/Assessment:  Goals Addressed            This Visit's Progress   . Pharmacy Care Plan       CARE PLAN ENTRY (see longitudinal plan of care for additional care plan information)  Current Barriers:  . Chronic Disease Management support, education, and care coordination needs related to Hypertension, Hyperlipidemia, Diabetes, Heart Failure, Coronary Artery Disease, COPD, Chronic Kidney Disease, Anxiety, Gout, and V-tach, fibromyalgia, chronic rhinitis   Hypertension BP Readings from Last 3  Encounters:  12/21/19 120/60  12/12/19 133/74  11/10/19 126/68   . Pharmacist Clinical Goal(s): o Over the next 30 days, patient will work with PharmD and providers to maintain BP goal <130/80 . Current regimen:   Bisoprolol 5 mg,1 tablet once daily   Spironolactone 72m, 0.5 (one-half) tablet once daily . Patient self care activities - Over the next 90 days, patient will: o Check BP daily, document, and provide at future appointments o Ensure daily salt intake < 2300 mg/day  Hyperlipidemia Lab Results  Component Value Date/Time   LDLCALC 140 (H) 12/19/2016 07:36 AM   LDLDIRECT 115.0 07/02/2017 11:20 AM   . Pharmacist Clinical Goal(s): o Over the next 90 days, patient will work with PharmD and providers to achieve LDL goal < 70 . Current regimen:  o No medications . Interventions: o Overdue for lipid panel. Recommend blood work for cholesterol levels.  . Patient self care activities - Over the next 90 days, patient will: o Modify diet (diet high in plant sterols (  fruits/vegetables/nuts/whole grains/legumes) may reduce your cholesterol.  Encouraged increasing fiber to a daily intake of 10-25g/day   Diabetes Lab Results  Component Value Date/Time   HGBA1C 7.2 (H) 03/09/2019 04:12 PM   HGBA1C 7.6 (H) 05/10/2018 01:16 PM   . Pharmacist Clinical Goal(s): o Over the next 90 days, patient will work with PharmD and providers to achieve A1c goal <7% . Current regimen:  o No medications  . Interventions: o We discussed: trialing other medications; patient would like to have blood work done first before considering other medications. o Recommend repeat blood work for A1c, microalbuminuria.  . Patient self care activities - Over the next 30 days, patient will: o Check blood sugar once daily, document, and provide at future appointments  Coronary artery disease . Pharmacist Clinical Goal(s) o Over the next 90 days, patient will work with PharmD and providers to prevent heart  attacks, strokes, stent closure.  . Current regimen:  . Aspirin 87m, 1 tablet once daily  . Clopidogrel 7465m1 tablet once daily  . Nitroglycerin 0.65m76mL, place 1 tablet under tongue every five minutes as needed for chest pain x 3 doses . Patient self care activities - Over the next 90 days, patient will: o Continue current medications as instructed; managed by cardiologist.   COPD . Pharmacist Clinical Goal(s) o Over the next 90 days, patient will work with PharmD and providers to prevent worsening of shortness of breath and hospitalizations . Current regimen:   Albuterol HFA inhaler (Proair HFA), inhale 2 puffs every four hours as needed for wheezing and shortness of breath   Albuterol 0.083% nebulizer solution, use 1 vial in nebulizer every four hours as needed for wheezing or shortness of breath  . Patient self care activities o Patient will continue current medications as instructed.   Ventricular tachycardia (abnormal heart beats) . Pharmacist Clinical Goal(s) o Over the next 90 days, patient will work with PharmD and providers to maintain heart normal sinus rhythm.  . Current regimen:  o Amiodarone 200m16m tablet twice daily  . Patient self care activities o Patient will continue current medications as instructed.   Gout . Pharmacist Clinical Goal(s) o Over the next 90 days, patient will work with PharmD and providers to prevent gout flare-ups.  . Current regimen:  o Allopurinol 300mg68mtablet once daily  . Patient self care activities o Patient will continue current medications as instructed.   Fibromyalgia . Pharmacist Clinical Goal(s) o Over the next 90 days, patient will work with PharmD and providers to maintain fibromyalgia symptoms controlled.  . Current regimen:  o Duloxetine 60mg,13mapsule twice daily . Patient self care activities o Patient will continue current medications as instructed.   Combined systolic and diastolic heart failure . Pharmacist  Clinical Goal(s) o Over the next 90 days, patient will work with PharmD and providers to check weight daily and contact physician if weight gain of more than 3 pounds overnight or more than 5 pounds in a week. . Current regimen:   Bisoprolol 5 mg,1 tablet once daily   Spironolactone 25mg, 32m(one-half) tablet once daily  Torsemide 20mg, 448mlets twice daily . Interventions: o We discussed weighing daily; if you gain more than 3 pounds in one day or 5 pounds in one week call your doctor . Patient self care activities o Patient will continue current medications and follow up with cardiology if any changes.   Anxiety . Pharmacist Clinical Goal(s) o Over the next 90 days, patient  will work with PharmD and providers to minimize anxiety symptoms.  . Current regimen:  o Alprazolam 52m, 1 tablet every mornign and 1.5 (one and one-half) tablet at night as needed for anxiety (only takes nighttime dose)  . Patient self care activities o Patient will continue medications as directed   Chronic rhinitis . Pharmacist Clinical Goal(s) o Over the next 90 days, patient will work with PharmD and providers to minimize allergy symptoms.  . Current regimen:  . Azelastine (Astelin) 0.1% nasal spray, use 2 sprays in each nostril twice daily  As directed  . Guaifenesin (Mucinex) 6084m 2 tablets twice daily as needed . Loratadine 1028m1 tablet once daily  . Patient self care activities o Patient will continue current medications as instructed.   Chronic kidney disease, stage 3 . Pharmacist Clinical Goal(s) o Over the next 90 days, patient will work with PharmD and providers to maintain stable kidney function.  . Patient self care activities o Patient will avoid medications like NSAIDs (ex. Ibuprofen, naproxen, etc.)  Medication management . Pharmacist Clinical Goal(s): o Over the next 90 days, patient will work with PharmD and providers to maintain optimal medication adherence . [facility] o Comprehensive medication review performed. o Utilize [facility] for medication synchronization, packaging and delivery . Patient self care activities - Over the next 90 days, patient will: o Take medications as prescribed o Report any questions or concerns to PharmD and/or provider(s)  Initial goal documentation        SDOH Interventions     Most Recent Value  SDOH Interventions  Financial Strain Interventions Intervention Not Indicated  Transportation Interventions Intervention Not Indicated  [Son drives patient to appointments]      COPD   Last spirometry score: no PFTs found in chart   Eosinophil count:   Lab Results  Component Value Date/Time   EOSPCT 4 12/05/2019 12:59 PM  %                               Eos (Absolute):  Lab Results  Component Value Date/Time   EOSABS 0.4 12/05/2019 12:59 PM   Tobacco Status:  Social History   Tobacco Use  Smoking Status Former Smoker  . Packs/day: 0.50  . Years: 62.00  . Pack years: 31.00  . Types: Cigarettes  . Quit date: 07/19/2017  . Years since quitting: 2.4  Smokeless Tobacco Never Used   Patient has failed these meds in past: none   Patient is currently controlled on the following medications:   Albuterol HFA inhaler (Proair HFA), inhale 2 puffs every four hours as needed for wheezing and shortness of breath   Albuterol 0.083% nebulizer solution, use 1 vial in nebulizer every four hours as needed for wheezing or shortness of breath   Using maintenance inhaler regularly? NA   Frequency of rescue inhaler use:  daily- patient reports using mostly at night before bedtime   Plan Continue current medications   Diabetes   Recent Relevant Labs: Lab Results  Component Value Date/Time   HGBA1C 7.2 (H) 03/09/2019 04:12 PM   HGBA1C 7.6 (H) 05/10/2018 01:16 PM    Checking BG: Daily  Recent FBG Readings:151-200  Patient has failed these meds in past: metformin (stomach  issues), glipizide   Patient is currently uncontrolled on the following medications:   No medications   Last diabetic Eye exam: No results found for: HMDIABEYEEXA  Last diabetic  Foot exam: No results found for: HMDIABFOOTEX   We discussed: trialing other medications; patient would like to have blood work done first before considering other medications.   Plan Recommend microalbuminuria and A1c at next visit with Dr. Sarajane Jews.  Will follow up with patient after visit with Dr. Sarajane Jews for assessment and assistance for patient assistance for medications if needed.   History of V. Tach/VF status post AICD   Patient is currently controlled on the following medications:  . Amiodarone 280m, 1 tablet twice daily    Plan Managed by cardiology (Dr. CSallyanne Kuster  Continue current medications   Heart falilure   Patient reports weighting herself daily and maintaining at 177 lbs. Reports swelling has improved and notes no SOB. Except when about to go to bed.    Type: Combined Systolic and Diastolic  Last ejection fraction: 20 to 25%  NYHA Class: III (marked limitation of activity)   Patient has failed these meds in past: furosemide, lisinopril, metoprolol  Patient is currently controlled on the following medications:   Bisoprolol 5 mg,1 tablet once daily   Spironolactone 277m 0.5 (one-half) tablet once daily  Torsemide 2046m4 tablets twice daily   We discussed weighing daily; if you gain more than 3 pounds in one day or 5 pounds in one week call your doctor  Plan Managed by cardiology.  Continue current medications  Hypertension   Denies dizziness/ lightheadedness.   Office blood pressures are  BP Readings from Last 3 Encounters:  12/21/19 120/60  12/12/19 133/74  11/10/19 126/68   Patient has failed these meds in the past: amlodpine, lisinopril, metoprolol   Patient checks BP at home daily  Patient home BP readings are ranging: unable to provide (patient states BP is WNL).     Patient is controlled on:   Bisoprolol 5 mg,1 tablet once daily   Spironolactone 36m64m.5 (one-half) tablet once daily  Plan Continue current medications   Hyperlipidemia   LDL goal < 70  Lipid Panel     Component Value Date/Time   CHOL 183 07/02/2017 1120   TRIG 296.0 (H) 07/02/2017 1120   HDL 31.50 (L) 07/02/2017 1120   LDLCALC 140 (H) 12/19/2016 0736   LDLDIRECT 115.0 07/02/2017 1120    Hepatic Function Latest Ref Rng & Units 12/05/2019 10/22/2019 02/01/2018  Total Protein 6.5 - 8.1 g/dL 6.4(L) 6.7 7.1  Albumin 3.5 - 5.0 g/dL 3.6 3.8 4.3  AST 15 - 41 U/L '19 18 20  ' ALT 0 - 44 U/L '14 14 19  ' Alk Phosphatase 38 - 126 U/L 97 81 96  Total Bilirubin 0.3 - 1.2 mg/dL 0.7 0.6 0.7  Bilirubin, Direct 0.0 - 0.2 mg/dL - 0.1 0.1     The ASCVD Risk score (GoffLincolnt al., 2013) failed to calculate for the following reasons:   The 2013 ASCVD risk score is only valid for ages 40 t3079  104he patient has a prior MI or stroke diagnosis   Patient has failed these meds in past: rosuvastatin (cramps)  Patient is currently uncontrolled on the following medications:  . No medications   Plan Patient overdue for lipid panel. Will readdress at follow up. Patient has declined retrial of statins and PCSK-9 inhibitors.   CAD   Patient reports never needing to use nitroglycerin  Patient is currently controlled on the following medications:  . Aspirin 81mg38mtablet once daily  . Clopidogrel 75mg,58mblet once daily  . Nitroglycerin 0.4mg SL69mlace 1 tablet under  tongue every five minutes as needed for chest pain x 3 doses  Plan Managed by cardiologist  Continue current medications   Gout   Uric acid: 8.5 (10/12/2015)   Patient reports recent small flare-up, but overall doing well on allopurinol.   Patient is currently controlled on the following medications:  . Allopurinol 3663m, 1 tablet once daily   Plan Continue current medications  Fibromyalgia   Patient denies sx  associated with fibromyalgia   Patient is currently controlled on the following medications:  . Duloxetine 678m 1 capsule twice daily  Plan Continue current medications  Anxiety    Patient is currently controlled on the following medications:   Alprazolam 63m9m1 tablet every mornign and 1.5 (one and one-half) tablet at night as needed for anxiety (reports taking only at nighttime)    Plan Continue current medications  GERD  Patient denies sx of GERD and states she was given pantoprazole when she was hospitalized. Reports she did not start since being discharged.   Patient is currently controlled on the following medications:  . No medications  We discussed:  Discussed non-pharmacological interventions for acid reflux. Take measures to prevent acid reflux, such as avoiding spicy foods, avoiding caffeine, avoid laying down a few hours after eating, and raising the head of the bed.  Plan Recommend to continue as is with no medications   Vitamin B12 supplementation  Patient reports when she was in the hospital, they recommended for her to take B12 supplement.   Vitamin B-12  Date Value Ref Range Status  12/02/2010 297 211 - 911 pg/mL Final   Patient is currently on the following medications:  . VMarland Kitchentamin B-12 5000m55m1 tablet once daily   Plan Continue current medications  Vitamin D supplementation    Vit D, 25-Hydroxy  Date Value Ref Range Status  12/02/2010 20 (L) 30 - 89 ng/mL Final    Comment:    This assay accurately quantifies Vitamin D, which is the sum of the 25-Hydroxy forms of Vitamin D2 and D3.  Studies have shown that the optimum concentration of 25-Hydroxy Vitamin D is 30 ng/mL or higher.  Concentrations of Vitamin D between 20 and 29 ng/mL are considered to be insufficient and concentrations less than 20 ng/mL are considered to be deficient for Vitamin D.    Patient is currently  on the following medications:  . cholecalciferol (vitamin D) 1000 units,  1 tablet once daily   Plan Continue current medications  Allergies/ congestion   Patient is currently controlled on the following medications:  . Azelastine (Astelin) 0.1% nasal spray, use 2 sprays in each nostril twice daily  As directed  . Guaifenesin (Mucinex) 600mg73mtablets twice daily as needed . Loratadine 10mg,163mablet once daily   Plan Continue current medications  CKD, Stage III    Kidney Function Lab Results  Component Value Date/Time   CREATININE 1.47 (H) 12/12/2019 03:59 AM   CREATININE 1.45 (H) 12/11/2019 03:08 AM   CREATININE 1.05 (H) 08/07/2016 02:33 PM   CREATININE 1.12 (H) 01/10/2013 05:45 PM   GFR 47.66 (L) 03/09/2019 04:12 PM   GFRNONAA 33 (L) 12/12/2019 03:59 AM   GFRAA 38 (L) 12/12/2019 03:59 AM   K 3.7 12/12/2019 03:59 AM   K 4.1 12/11/2019 03:08 AM  Kidney function stable.   Plan Continue to monitor and adjust medications as needed.     Medication Management  Patient organizes medications: patient uses weekly pill box. But notes takes her time to  organize.  Primary pharmacy: [facility]  Adherence: No other gaps in refill history per medication dispense history.  - amiodarone 216m (filled 10/26/19 for 30DS)   Patient to benefit from adherence packaging.  Verbal consent obtained for [facility] enhanced pharmacy services (medication synchronization, adherence packaging, delivery coordination). A medication sync plan was created to allow patient to get all medications delivered once every 30 to 90 days per patient preference. Patient understands they have freedom to choose pharmacy and clinical pharmacist will coordinate care between all prescribers and [facility].   Follow up - Recommended A1c, microalbuminuria, lipid panel (per patient she would like to discuss with Dr. FSarajane Jewsbefore deciding to make changes) - Will call patient within 30 days for medication cost review ([facility] and [facility]) and transition to [employer] cost effective.   AAnson Crofts PharmD Clinical Pharmacist [facility]([phone]

## 2019-12-18 NOTE — Progress Notes (Deleted)
Cardiology Office Note:    Date:  12/18/2019   ID:  Sydney Phillips, DOB 1936-02-27, MRN [id number]  PCP:  Laurey Morale, MD  Cardiologist:  [name], MD  Electrophysiologist:  [name], MD   Referring MD: Laurey Morale, MD   Chief Complaint: hospital follow-up for CHF  History of Present Illness:    Sydney Phillips is a 84 y.o. female with a history of CAD ***, chronic combined CHF/ ischemic cardiomyopathy, recurrent episodes of ventricular tachycardia/Torsades requiring defibrillation, s/p Medtronic ICD, LBBB, PVD with left subclavian artery stenosis s/p PTA stent in 2013, COPD on home O2, hypertension, hyperlipidemia, diabetes mellitus, CKD stage III, fibromyalgia, and ongoing tobacco abuse who is followed by Dr. Sallyanne Kuster and Dr. Curt Bears and presents today for hospital follow-up.  Most recent Echo from 08/2019 showed LVEF of 20-25% with global hypokinesis, grade 2 diastolic dysfunction, moderate MR, mild to moderate TR, and moderately elevated PASP. Patient was admitted from 10/22/2019 to 10/26/2019 for ventricular fibrillation and 4 defibrillator shocks after self-discontinuation of amiodarone and in the setting of hypomagnesemia and hypokalemia. She was also noted to have acute on chronic CHF. She underwent left heart catheterization on 10/24/2019 which showed stable findings. No intervention was needed. Amiodarone was restarted and she was diuresed and switched to Torsemide prior to discharge. Following that admission, she had problems with acute on chronic renal failure and hyperkalemia with creatinine peaking at 2.66 and potassium peaking at 6.4. Spironolactone was stopped. Spironolactone and potassium supplement were stopped and creatinine and potassium gradually improved.   She was readmitted from 12/05/2019 to 12/09/2019 for acute hypoxic respiratory failure secondary to acute on chronic combined CHF initially requiring BiPAP. BNP elevated at 1,385. However, labs also  consistent with chronic CO2 retention due to COPD. She was treated with IV Lasix with good response. Diuresed over 6 L during admission. Discharge weight was 178 lbs (dry weight 175 - 180 lbs). She was discharged on Torsemide 60mg  twice daily and Spironolactone 12.5mg  daily. Also discharged on Bisoprolol 5mg  daily, Amiodarone 200mg  twice daily, Magnesium oxide 400mg  daily, Clopidogrel 75mg  daily, and Aspirin 81mg  daily.  Patient presents today for follow-up. ***  Chronic Combined CHF - Recently admitted with acute on chronic CHF. - Most recent Echo from 08/2019 showed LVEF of 20-25% with global hypokinesis and grade 2 diastolic dysfunction.  - *** - Continue Torsemide 60mg  twice daily. *** - Continue Spironolactone 12.5mg  daily. *** - Continue Bisoprolol 5mg  daily. - Not on ACEi/ARB/ARNI as she has had problems with orthostatic hypotension and syncope in the past. More recently she has also had problems with hyperkalemia.  CAD - Prior stenting to RCA and LCX. - Most recent cath in 10/2019 showed stable disease.  - Stable. No angina. *** - Continue Aspirin 81mg  daily, Plavix 75mg  daily, and Bisoprolol 5mg  daily. Intolerant to multiple statins in the past and has declined PCSK9 inhibitor.   VT/VF s/p ICD - S/p Medtronic CRT-D. - No recent VT since admission in 10/2019.  - Device readjusted during recent admission. LV lead output was increased due to intermittent loss of LV capture.  - Continue Amiodarone 200mg  twice daily. *** - Continue Bisoprolol 5mg  daily. - Followed by Dr. Sallyanne Kuster. Continue remote checks.   PAD - History of left subclavian stent and previous left carotid endarterectomy.  - Continue DAPT as above. Intolerant to statins.  Hypertension  Hyperlipidemia - Most recent lipid panel from ***.  - Patient has been intolerant to multiple statins in the  past but has declined PCSK9 inhibitors. ***  Abnormal TSH - TSH elevated at 11.85 during recent admission but free T4  normal. - Felt to likely be Amiodarone related but could also represent sick euthyroid syndrome.  - Dr. Sallyanne Kuster recommended rechecking free T4 in 1-2 months. Patient already has follow-up scheduled with Dr. Sallyanne Kuster on 01/20/2020 so this can be rechecked at that time.  COPD on Home O2 - Labs during admission consistent with chronic CO2 retention.  - Unfortunately patient continues to smoke.***   Past Medical History:  Diagnosis Date  . Anxiety   . Arthritis   . Asthma   . Automatic implantable cardioverter-defibrillator in situ   . Bronchitis   . CAD (coronary artery disease)    a. s/p DES to LCx/RCA 05/2016, ostial LAD disease.  . Cardiomyopathy ([place])   . Chronic back pain   . Chronic combined systolic and diastolic CHF (congestive heart failure) ([place])   . Chronic constipation   . Chronic pain   . CKD (chronic kidney disease), stage III   . Colon polyps 2003.  2015.   HP polyps 2003.  adnomas 2015.  required referal to [facility] for colonoscopic resection of flat polyps.   Marland Kitchen COPD (chronic obstructive pulmonary disease) ([place])   . Dementia ([place])   . Depression   . Depression with anxiety    takes Cymbalta daily  . Diabetes mellitus ([place])   . Dyspnea   . Early cataracts, bilateral   . Fibromyalgia   . GERD (gastroesophageal reflux disease)    was on meds but was taken off;now watches what she eats  . Hemorrhoids   . History of kidney stones   . Hx of colonic polyps   . Hyperlipemia    takes Crestor daily  . Hypertension    takes Amlodipine and Metoprolol daily  . Insomnia   . LBBB (left bundle branch block)    Stress test 09/03/2010, EF 55  . Myocardial infarction ([place])   . PAD (peripheral artery disease) ([facility])    Carotid, subclavian, and lower extremity beds, currently not symptomatic  . Presence of combination internal cardiac defibrillator (ICD) and pacemaker   . Presence of permanent cardiac pacemaker   . Pulmonary hypertension ([place])   . S/P angioplasty with stent,  lt. subclavian 07/31/11 08/01/2011  . Subclavian arterial stenosis, lt, with PTA/STENT 07/31/11 08/01/2011  . Syncope 07/28/2011   EF - 50-55, moderate concentric hypertrophy in left ventricle  . Urinary incontinence   . Vertigo    takes Meclizine prn    Past Surgical History:  Procedure Laterality Date  . ABDOMINAL AORTAGRAM N/A 08/12/2013   Procedure: ABDOMINAL Maxcine Ham;  Surgeon: Elam Dutch, MD;  Location: [facility] C[facility] LAB;  Service: Cardiovascular;  Laterality: N/A;  . ABDOMINAL HYSTERECTOMY    . APPENDECTOMY    . BACK SURGERY  2012  . BIV ICD GENERTAOR CHANGE OUT Left 02/20/2012   Procedure: BIV ICD GENERTAOR CHANGE OUT;  Surgeon: [name], MD;  Location: [facility] CATH LAB;  Service: Cardiovascular;  Laterality: Left;  . CARDIAC CATHETERIZATION  12/01/2007   By Dr. Melvern Banker, left heart cath,   . CARDIAC CATHETERIZATION N/A 05/20/2016   Procedure: Right/Left Heart Cath and Coronary Angiography;  Surgeon: Sherren Mocha, MD;  Location: [place] CV LAB;  Service: Cardiovascular;  Laterality: N/A;  . CARDIAC CATHETERIZATION N/A 05/20/2016   Procedure: Coronary Stent Intervention;  Surgeon: Sherren Mocha, MD;  Location: [place] CV LAB;  Service: Cardiovascular;  Laterality: N/A;  .  CARDIAC DEFIBRILLATOR PLACEMENT  05/2008   By Dr Blanch Media, Medtronic CANNOT HAVE MRI's  . CAROTID ANGIOGRAM N/A 07/31/2011   Procedure: CAROTID ANGIOGRAM;  Surgeon: Lorretta Harp, MD;  Location: [facility] CATH LAB;  Service: Cardiovascular;  Laterality: N/A;  carotid angiogram and possible Lt SCA PTA  . COLONOSCOPY W/ POLYPECTOMY  12/2013  . CORONARY ANGIOPLASTY    . ENDARTERECTOMY Left 11/08/2014   Procedure: LEFT CAROTID ENDARTERECTOMY WITH HEMASHIELD PATCH ANGIOPLASTY;  Surgeon: Elam Dutch, MD;  Location: [place];  Service: Vascular;  Laterality: Left;  . ESOPHAGOGASTRODUODENOSCOPY N/A 01/20/2014   Procedure: ESOPHAGOGASTRODUODENOSCOPY (EGD);  Surgeon: Jerene Bears, MD;  Location: [facility] ENDOSCOPY;  Service:  Endoscopy;  Laterality: N/A;  . FEMORAL-POPLITEAL BYPASS GRAFT Right 10/12/2013   Procedure:   Femoral-Peroneal trunk  bypass with nonreversed greater saphenous vein graft;  Surgeon: Elam Dutch, MD;  Location: [place];  Service: Vascular;  Laterality: Right;  . GIVENS CAPSULE STUDY N/A 01/20/2014   Procedure: GIVENS CAPSULE STUDY;  Surgeon: Jerene Bears, MD;  Location: [place];  Service: Gastroenterology;  Laterality: N/A;  . ICD GENERATOR CHANGEOUT N/A 03/04/2017   Procedure: ICD Generator Changeout;  Surgeon: [name], MD;  Location: [place] CV LAB;  Service: Cardiovascular;  Laterality: N/A;  . INSERT / REPLACE / REMOVE PACEMAKER    . INTRAOPERATIVE ARTERIOGRAM Right 10/12/2013   Procedure: INTRA OPERATIVE ARTERIOGRAM;  Surgeon: Elam Dutch, MD;  Location: [place];  Service: Vascular;  Laterality: Right;  . LEFT HEART CATH AND CORONARY ANGIOGRAPHY N/A 10/24/2019   Procedure: LEFT HEART CATH AND CORONARY ANGIOGRAPHY;  Surgeon: Belva Crome, MD;  Location: [place] CV LAB;  Service: Cardiovascular;  Laterality: N/A;  . ORIF ELBOW FRACTURE  08/16/2011   Procedure: OPEN REDUCTION INTERNAL FIXATION (ORIF) ELBOW/OLECRANON FRACTURE;  Surgeon: Schuyler Amor, MD;  Location: [place];  Service: Orthopedics;  Laterality: Left;  . RENAL ANGIOGRAM N/A 08/12/2013   Procedure: RENAL ANGIOGRAM;  Surgeon: Elam Dutch, MD;  Location: [facility] CATH LAB;  Service: Cardiovascular;  Laterality: N/A;  . SUBCLAVIAN STENT PLACEMENT Left 07/31/2011   7x18 Genesis, balloon, with reduction of 90% ostial left subclavian artery stenosis to 0% with residual excellent flow  . TONSILLECTOMY    . TUBAL LIGATION      Current Medications: No outpatient medications have been marked as taking for the 12/23/19 encounter (Appointment) with Darreld Mclean, PA-C.     Allergies:   Potassium-containing compounds, Neomycin-polymyxin b gu, Other, Azithromycin, Codeine, Darvon, Erythromycin, Meloxicam, Norco  [hydrocodone-acetaminophen], Penicillins, Propoxyphene n-acetaminophen, Rofecoxib, Rosuvastatin, Statins, and Sulfa antibiotics   Social History   Socioeconomic History  . Marital status: Widowed    Spouse name: Not on file  . Number of children: 3  . Years of education: 46  . Highest education level: Not on file  Occupational History  . Occupation: Retired  Tobacco Use  . Smoking status: Former Smoker    Packs/day: 0.50    Years: 62.00    Pack years: 31.00    Types: Cigarettes    Quit date: 07/19/2017    Years since quitting: 2.4  . Smokeless tobacco: Never Used  Vaping Use  . Vaping Use: Never used  Substance and Sexual Activity  . Alcohol use: No    Alcohol/week: 0.0 standard drinks  . Drug use: No  . Sexual activity: Never    Birth control/protection: Surgical  Other Topics Concern  . Not on file  Social History Narrative   Ok  to share information with medical POA, Son Gabriel Carina   Right-handed   Caffeine: Pepsi   Social Determinants of Health   Financial Resource Strain: Low Risk   . Difficulty of Paying Living Expenses: Not very hard  Food Insecurity:   . Worried About Charity fundraiser in the Last Year:   . Arboriculturist in the Last Year:   Transportation Needs: No Transportation Needs  . Lack of Transportation (Medical): No  . Lack of Transportation (Non-Medical): No  Physical Activity:   . Days of Exercise per Week:   . Minutes of Exercise per Session:   Stress:   . Feeling of Stress :   Social Connections: Moderately Isolated  . Frequency of Communication with Friends and Family: More than three times a week  . Frequency of Social Gatherings with Friends and Family: More than three times a week  . Attends Religious Services: Never  . Active Member of Clubs or Organizations: Yes  . Attends Archivist Meetings: Never  . Marital Status: Widowed     Family History: The patient's ***family history includes CAD in her father; Cancer in her  sister; Colon cancer in her maternal grandmother; Deep vein thrombosis in her son; Diabetes in her sister; Heart disease in her father, mother, and sister; Hyperlipidemia in her father and another family member. There is no history of Anesthesia problems, Hypotension, Malignant hyperthermia, or Pseudochol deficiency.  ROS:   Please see the history of present illness.    *** All other systems reviewed and are negative.  EKGs/Labs/Other Studies Reviewed:    The following studies were reviewed today:  Echocardiogram 08/24/2019: Impressions: 1. Left ventricular ejection fraction, by estimation, is 20 to 25%. The  left ventricle has severely decreased function. The left ventricle  demonstrates global hypokinesis. The left ventricular internal cavity size  was mildly dilated. Left ventricular  diastolic parameters are consistent with Grade II diastolic dysfunction  (pseudonormalization).  2. Right ventricular systolic function is normal. The right ventricular  size is normal. There is moderately elevated pulmonary artery systolic  pressure.  3. The mitral valve is normal in structure. Moderate mitral valve  regurgitation.  4. Tricuspid valve regurgitation is mild to moderate.  5. The aortic valve is normal in structure. Aortic valve regurgitation is  trivial. No aortic stenosis is present.   Comparison(s): 07/20/17 EF 35-40%. _______________  Myoview 08/24/2019: - Nuclear stress EF: 21%. - The left ventricular ejection fraction is severely decreased (<30%). - NSR with Vpaced rhythm noted throughout study with occasional PVCs - There is a small defect of moderate severity present in the apex location. The defect is non-reversible. There is no ischemia. - This is a high risk study due to LV dysfunction. - Compared to nuclear study of 2016, severe LV dilatation and dysfunction are now present. _______________  Left Heart Catheterization 10/24/2019:  Moderate three-vessel coronary  disease.  30% ostial left main.  60% ostial LAD (unchanged from 2017) with moderate mid LAD plaque and calcification.  Patent proximal to mid circumflex with diffuse 50% in-stent restenosis.  Ostial circumflex contains 30 to 40% narrowing.  RCA contains up to 50% diffuse in-stent restenosis.  Proximal to the stented segment is an eccentric 70% region of narrowing  Elevated LVEDP, 31 mmHg suggesting decompensated acute on chronic combined systolic heart failure.  Catheterization from right radial was compromised by difficulty with catheter advancement, most likely due to selection of the radial recurrent route to the subclavian artery requiring  that catheter size be downgraded to 4 [place].  Recommendations:  No high-grade thrombotic appearing lesions noted.  Three-vessel disease as noted above.  Coronary angiography/PCI attempt in the future should not be via the right radial approach.  Guideline directed therapy for acute on chronic combined systolic and diastolic heart failure.  Consider advanced heart failure team consult.    EKG:  EKG *** ordered today. EKG personally reviewed and demonstrates ***.  Recent Labs: 12/05/2019: ALT 14; B Natriuretic Peptide 1,385.6 12/08/2019: TSH 11.855 12/10/2019: Hemoglobin 14.9; Platelets 274 12/11/2019: Magnesium 2.2 12/12/2019: BUN 38; Creatinine, Ser 1.47; Potassium 3.7; Sodium 140  Recent Lipid Panel    Component Value Date/Time   CHOL 183 07/02/2017 1120   TRIG 296.0 (H) 07/02/2017 1120   HDL 31.50 (L) 07/02/2017 1120   CHOLHDL 6 07/02/2017 1120   VLDL 59.2 (H) 07/02/2017 1120   LDLCALC 140 (H) 12/19/2016 0736   LDLDIRECT 115.0 07/02/2017 1120    Physical Exam:    Vital Signs: There were no vitals taken for this visit.    Wt Readings from Last 3 Encounters:  12/12/19 178 lb 9.6 oz (81 kg)  11/10/19 176 lb (79.8 kg)  11/04/19 174 lb (78.9 kg)     General: 84 y.o. female in no acute distress. HEENT: Normocephalic and  atraumatic. Sclera clear. EOMs intact. Neck: Supple. No carotid bruits. No JVD. Heart: *** RRR. Distinct S1 and S2. No murmurs, gallops, or rubs. Radial and distal pedal pulses 2+ and equal bilaterally. Lungs: No increased work of breathing. Clear to ausculation bilaterally. No wheezes, rhonchi, or rales.  Abdomen: Soft, non-distended, and non-tender to palpation. Bowel sounds present in all 4 quadrants.  MSK: Normal strength and tone for age. *** Extremities: No lower extremity edema.    Skin: Warm and dry. Neuro: Alert and oriented x3. No focal deficits. Psych: Normal affect. Responds appropriately.   Assessment:    No diagnosis found.  Plan:     Disposition: Follow up in ***   Medication Adjustments/Labs and Tests Ordered: Current medicines are reviewed at length with the patient today.  Concerns regarding medicines are outlined above.  No orders of the defined types were placed in this encounter.  No orders of the defined types were placed in this encounter.   There are no Patient Instructions on file for this visit.   Signed, Darreld Mclean, PA-C  12/18/2019 7:43 PM    [facility]

## 2019-12-20 NOTE — Progress Notes (Signed)
No ICM remote transmission received for 12/14/2019 and next ICM transmission scheduled for 01/09/2020.

## 2019-12-21 ENCOUNTER — Encounter: Payer: Self-pay | Admitting: Family Medicine

## 2019-12-21 ENCOUNTER — Other Ambulatory Visit: Payer: Self-pay

## 2019-12-21 ENCOUNTER — Ambulatory Visit (INDEPENDENT_AMBULATORY_CARE_PROVIDER_SITE_OTHER): Payer: Medicare Other | Admitting: Family Medicine

## 2019-12-21 ENCOUNTER — Telehealth: Payer: Self-pay | Admitting: Cardiovascular Disease

## 2019-12-21 VITALS — BP 120/60 | HR 60 | Temp 97.9°F | Ht 65.0 in | Wt 177.0 lb

## 2019-12-21 DIAGNOSIS — M25532 Pain in left wrist: Secondary | ICD-10-CM

## 2019-12-21 DIAGNOSIS — J439 Emphysema, unspecified: Secondary | ICD-10-CM

## 2019-12-21 DIAGNOSIS — I10 Essential (primary) hypertension: Secondary | ICD-10-CM | POA: Diagnosis not present

## 2019-12-21 DIAGNOSIS — M25522 Pain in left elbow: Secondary | ICD-10-CM

## 2019-12-21 DIAGNOSIS — S62102A Fracture of unspecified carpal bone, left wrist, initial encounter for closed fracture: Secondary | ICD-10-CM

## 2019-12-21 DIAGNOSIS — I255 Ischemic cardiomyopathy: Secondary | ICD-10-CM

## 2019-12-21 DIAGNOSIS — J449 Chronic obstructive pulmonary disease, unspecified: Secondary | ICD-10-CM

## 2019-12-21 DIAGNOSIS — G4733 Obstructive sleep apnea (adult) (pediatric): Secondary | ICD-10-CM

## 2019-12-21 DIAGNOSIS — I272 Pulmonary hypertension, unspecified: Secondary | ICD-10-CM

## 2019-12-21 DIAGNOSIS — N1832 Chronic kidney disease, stage 3b: Secondary | ICD-10-CM

## 2019-12-21 DIAGNOSIS — J9611 Chronic respiratory failure with hypoxia: Secondary | ICD-10-CM

## 2019-12-21 MED ORDER — TORSEMIDE 20 MG PO TABS
80.0000 mg | ORAL_TABLET | Freq: Two times a day (BID) | ORAL | 1 refills | Status: DC
Start: 2019-12-21 — End: 2020-02-10

## 2019-12-21 NOTE — Telephone Encounter (Signed)
Patient called stating she was returning a call. I did not see any notes. I asked if she would like a call back and she stated she would wait for her son to get back, because he took the call.

## 2019-12-21 NOTE — Progress Notes (Deleted)
Cardiology Office Note Date:  12/21/2019  Patient ID:  Sydney Phillips, Sydney Phillips 07/04/1935, MRN [id number] PCP:  Laurey Morale, MD  Cardiologist:  Dr. Sallyanne Kuster EP: Dr. Curt Bears    Chief Complaint: f/u on labs/medicines  History of Present Illness: Sydney Phillips is a 84 y.o. female with history of CAD, ICM, chronic CHF (combined), ICD, CKD (III), DM, HTN, HLD, LBBB, fibromyalgia, PVD (s/p L subclavia artery stenosis w/PTA/stent 2013), VF  She was hospitalized [facility] for VF with shocks, in the setting of having stopped her amiodarone herself. Course complicated by A/C CHF. Lasix changed to torsemide on discharge [facility] 10/24/2019  Moderate three-vessel coronary disease.  30% ostial left main.  60% ostial LAD (unchanged from 2017) with moderate mid LAD plaque and calcification.  Patent proximal to mid circumflex with diffuse 50% in-stent restenosis. Ostial circumflex contains 30 to 40% narrowing.  RCA contains up to 50% diffuse in-stent restenosis. Proximal to the stented segment is an eccentric 70% region of narrowing  Elevated LVEDP, 31 mmHg suggesting decompensated acute on chronic combined systolic heart failure.  Catheterization from right radial was compromised by difficulty with catheter advancement, most likely due to selection of the radial recurrent route to the subclavian artery requiring that catheter size be downgraded to 4 [place] She saw A. Tillery, PA 11/02/19, she was feeling quite well. BMET updated, no changes to her therapy, noting CKD and orthostatic hypotension, syncope, falls have limited GDMT, bot felt candidate for Entresto. Urged to quit smoking, continued amio 200mg  BID, planned to see him in 4 weeks to f/u.  BMET noted K+ 6.4 and Creat 2.66 her K+ and diuretics held >> 5.2 and 2.62 >> 5.9 and 1.86 Seems she was instructed to go to the ER for redaw on labs though did not want to, repeat labs were done, again recommended to go to the ER, looks like she finally did  though left prior to being seen.  11/10/2019 I saw her She comes with her son, [name], "one of her twins".  She feels very well, uncertain exactly what the urgency of all this is.  She denies any kind of CP, palpitations or cardiac awareness.  No rest SOB, she has some baseline DOE. No dizzy spells, near syncope or syncope No further shocks.  She continues to smoke, no plans on quitting but reports she has cut back She wears O2 at night, has been for years, no symptoms of PND or orthopnea, says she sleeps quite well, has the head of her bed raised 30 degrees for years. They report she stopped the potassium after last call with Legrand Como.  They confirmed that she is taking Torsemide 60mg  BID She never stopped the spironolactine and is still taking this./ K+ 5.0 and Creat though up some 1.93 but still improved from the start of this. She is definitely not volume OL currently I have asked my MA to call her with the following STOP spironolactone as we discussed, stay off potassium and reduce the torsemide to 40mg  BID BMET Monday   She was hospitalized 12/05/2019 > 12/09/2019 with respiratory failure, CHF exacerbation.  She diuresed > 6 liters, discharge weight was 179. Dr. Loletha Grayer noted: -- "dry weight" 175-180. Torsemide 60 mg BID + spironolactone 12.5 mg daily (tendency to hyperkalemia in recent past). Recheck labs in office at [facility] visit in 1-2 weeks. See me in 1-2 months." -- COPDLowers her threshold for respiratory decompensation. Note ABG on admission with pCO2 54 and high normal pH, c/w with  chronic hypercapnia and metabolic compensation + hyperventilation on admission due to CHF. She is a Associate Professor".Unfortunately she continues to smoke (Quit 2019, restarted, but "cut back").On bisoprolol for its highly selective beta 1 properties -- TSH is high, but free T4 is still normal.Might represent sick euthyroid syndrome, but should recheck free T4 in a month or two. -- Normal device function  with 98% biventricular pacing. Device readjusted (LV lead output increased) due to intermittent loss of LV capture. Now appears to have consistent LV capture -- She has had multiple appropriate defibrillator discharges since device implantation, of which she has not always been aware. Currently on amiodarone without recent VT.  Amiodarone:We will need to review atwhatpoint we should decrease her dose of amiodaroneto the 200 mg daily dose. Will request Dr. Orvan Falconer.Normal LFTs this admission.  *** volume  *** device, h/o VT/VF *** on amio,? Reduce dose *** to see Dr C in 24mo *** BMET today    Device History: Medtronic BiV ICD implanted 03/04/2017   History of appropriate therapy: Yes History of AAD therapy: Yes; currently on amiodarone  Past Medical History:  Diagnosis Date  . Anxiety   . Arthritis   . Asthma   . Automatic implantable cardioverter-defibrillator in situ   . Bronchitis   . CAD (coronary artery disease)    a. s/p DES to LCx/RCA 05/2016, ostial LAD disease.  . Cardiomyopathy ([place])   . Chronic back pain   . Chronic combined systolic and diastolic CHF (congestive heart failure) ([place])   . Chronic constipation   . Chronic pain   . CKD (chronic kidney disease), stage III   . Colon polyps 2003.  2015.   HP polyps 2003.  adnomas 2015.  required referal to [facility] for colonoscopic resection of flat polyps.   Marland Kitchen COPD (chronic obstructive pulmonary disease) ([place])   . Dementia ([place])   . Depression   . Depression with anxiety    takes Cymbalta daily  . Diabetes mellitus ([place])   . Dyspnea   . Early cataracts, bilateral   . Fibromyalgia   . GERD (gastroesophageal reflux disease)    was on meds but was taken off;now watches what she eats  . Hemorrhoids   . History of kidney stones   . Hx of colonic polyps   . Hyperlipemia    takes Crestor daily  . Hypertension    takes Amlodipine and Metoprolol daily  . Insomnia   . LBBB (left bundle branch block)     Stress test 09/03/2010, EF 55  . Myocardial infarction ([place])   . PAD (peripheral artery disease) ([facility])    Carotid, subclavian, and lower extremity beds, currently not symptomatic  . Presence of combination internal cardiac defibrillator (ICD) and pacemaker   . Presence of permanent cardiac pacemaker   . Pulmonary hypertension ([place])   . S/P angioplasty with stent, lt. subclavian 07/31/11 08/01/2011  . Subclavian arterial stenosis, lt, with PTA/STENT 07/31/11 08/01/2011  . Syncope 07/28/2011   EF - 50-55, moderate concentric hypertrophy in left ventricle  . Urinary incontinence   . Vertigo    takes Meclizine prn    Past Surgical History:  Procedure Laterality Date  . ABDOMINAL AORTAGRAM N/A 08/12/2013   Procedure: ABDOMINAL Maxcine Ham;  Surgeon: Elam Dutch, MD;  Location: [facility] CATH LAB;  Service: Cardiovascular;  Laterality: N/A;  . ABDOMINAL HYSTERECTOMY    . APPENDECTOMY    . BACK SURGERY  2012  . BIV ICD GENERTAOR CHANGE OUT Left 02/20/2012   Procedure:  [place];  Surgeon: [name], MD;  Location: [facility] CATH LAB;  Service: Cardiovascular;  Laterality: Left;  . CARDIAC CATHETERIZATION  12/01/2007   By Dr. Melvern Banker, left heart cath,   . CARDIAC CATHETERIZATION N/A 05/20/2016   Procedure: Right/Left Heart Cath and Coronary Angiography;  Surgeon: Sherren Mocha, MD;  Location: [place] CV LAB;  Service: Cardiovascular;  Laterality: N/A;  . CARDIAC CATHETERIZATION N/A 05/20/2016   Procedure: Coronary Stent Intervention;  Surgeon: Sherren Mocha, MD;  Location: [place] CV LAB;  Service: Cardiovascular;  Laterality: N/A;  . CARDIAC DEFIBRILLATOR PLACEMENT  05/2008   By Dr Blanch Media, Medtronic CANNOT HAVE MRI's  . CAROTID ANGIOGRAM N/A 07/31/2011   Procedure: CAROTID ANGIOGRAM;  Surgeon: Lorretta Harp, MD;  Location: [facility] CATH LAB;  Service: Cardiovascular;  Laterality: N/A;  carotid angiogram and possible Lt SCA PTA  . COLONOSCOPY W/ POLYPECTOMY  12/2013  . CORONARY  ANGIOPLASTY    . ENDARTERECTOMY Left 11/08/2014   Procedure: LEFT CAROTID ENDARTERECTOMY WITH HEMASHIELD PATCH ANGIOPLASTY;  Surgeon: Elam Dutch, MD;  Location: [place];  Service: Vascular;  Laterality: Left;  . ESOPHAGOGASTRODUODENOSCOPY N/A 01/20/2014   Procedure: ESOPHAGOGASTRODUODENOSCOPY (EGD);  Surgeon: Jerene Bears, MD;  Location: [facility] ENDOSCOPY;  Service: Endoscopy;  Laterality: N/A;  . FEMORAL-POPLITEAL BYPASS GRAFT Right 10/12/2013   Procedure:   Femoral-Peroneal trunk  bypass with nonreversed greater saphenous vein graft;  Surgeon: Elam Dutch, MD;  Location: [place];  Service: Vascular;  Laterality: Right;  . GIVENS CAPSULE STUDY N/A 01/20/2014   Procedure: GIVENS CAPSULE STUDY;  Surgeon: Jerene Bears, MD;  Location: [place];  Service: Gastroenterology;  Laterality: N/A;  . ICD GENERATOR CHANGEOUT N/A 03/04/2017   Procedure: ICD Generator Changeout;  Surgeon: [name], MD;  Location: [place] CV LAB;  Service: Cardiovascular;  Laterality: N/A;  . INSERT / REPLACE / REMOVE PACEMAKER    . INTRAOPERATIVE ARTERIOGRAM Right 10/12/2013   Procedure: INTRA OPERATIVE ARTERIOGRAM;  Surgeon: Elam Dutch, MD;  Location: [place];  Service: Vascular;  Laterality: Right;  . LEFT HEART CATH AND CORONARY ANGIOGRAPHY N/A 10/24/2019   Procedure: LEFT HEART CATH AND CORONARY ANGIOGRAPHY;  Surgeon: Belva Crome, MD;  Location: [place] CV LAB;  Service: Cardiovascular;  Laterality: N/A;  . ORIF ELBOW FRACTURE  08/16/2011   Procedure: OPEN REDUCTION INTERNAL FIXATION (ORIF) ELBOW/OLECRANON FRACTURE;  Surgeon: Schuyler Amor, MD;  Location: [place];  Service: Orthopedics;  Laterality: Left;  . RENAL ANGIOGRAM N/A 08/12/2013   Procedure: RENAL ANGIOGRAM;  Surgeon: Elam Dutch, MD;  Location: [facility] CATH LAB;  Service: Cardiovascular;  Laterality: N/A;  . SUBCLAVIAN STENT PLACEMENT Left 07/31/2011   7x18 [place], balloon, with reduction of 90% ostial left subclavian artery stenosis to 0%  with residual excellent flow  . TONSILLECTOMY    . TUBAL LIGATION      Current Outpatient Medications  Medication Sig Dispense Refill  . ACCU-CHEK FASTCLIX LANCETS MISC USE   TO CHECK GLUCOSE ONCE DAILY (Patient taking differently: 1 each by Other route daily. ) 102 each 5  . ACCU-CHEK SMARTVIEW test strip USE  STRIP TO CHECK GLUCOSE ONCE DAILY (Patient taking differently: 1 each by Other route daily. ) 100 each 0  . albuterol (PROVENTIL) (2.5 MG/3ML) 0.083% nebulizer solution USE 1 VIAL IN NEBULIZER EVERY 4 HOURS AS NEEDED FOR WHEEZING FOR SHORTNESS OF BREATH (Patient taking differently: Take 2.5 mg by nebulization every 4 (four) hours as needed for wheezing or shortness  of breath. ) 225 mL 0  . allopurinol (ZYLOPRIM) 300 MG tablet Take 1 tablet by mouth once daily (Patient taking differently: Take 300 mg by mouth daily. ) 90 tablet 3  . ALPRAZolam (XANAX) 1 MG tablet TAKE 1 TABLET BY MOUTH IN THE MORNING AND 1 & 1/2 (ONE & ONE-HALF) NIGHTLY AS NEEDED FOR ANXIETY (Patient taking differently: Take 1 mg by mouth See admin instructions. 1mg  by mouth qam and 1.5mg  by mough every night as needed for anxiety.) 75 tablet 5  . amiodarone (PACERONE) 200 MG tablet Take 1 tablet (200 mg total) by mouth 2 (two) times daily. 60 tablet 6  . Ascorbic Acid (VITAMIN C PO) Take 1 tablet by mouth in the morning and at bedtime.    Marland Kitchen aspirin 81 MG chewable tablet Chew 1 tablet (81 mg total) by mouth daily. 30 tablet 1  . azelastine (ASTELIN) 0.1 % nasal spray USE 2 SPRAY(S) IN EACH NOSTRIL TWICE DAILY AS DIRECTED (Patient taking differently: Place 2 sprays into both nostrils 2 (two) times daily. ) 30 mL 0  . Biotin 5000 MCG CAPS Take 1 capsule by mouth daily.    . bisoprolol (ZEBETA) 5 MG tablet Take 1 tablet (5 mg total) by mouth daily. 30 tablet 1  . Cholecalciferol (VITAMIN D) 50 MCG (2000 UT) CAPS Take 4,000 Units by mouth daily.     . clopidogrel (PLAVIX) 75 MG tablet Take 1 tablet by mouth once daily 90 tablet  3  . Cyanocobalamin 5000 MCG TBDP Take 5,000 mcg by mouth daily.     . DULoxetine (CYMBALTA) 60 MG capsule Take 1 capsule (60 mg total) by mouth 2 (two) times daily. 270 capsule 3  . guaiFENesin (MUCINEX) 600 MG 12 hr tablet Take 2 tablets (1,200 mg total) by mouth 2 (two) times daily as needed. 20 tablet 0  . loratadine (CLARITIN) 10 MG tablet Take 10 mg by mouth daily.    . Magnesium Oxide 400 MG CAPS Take 1 capsule (400 mg total) by mouth daily. (Patient taking differently: Take 250 mg by mouth in the morning and at bedtime. Magnesium citrate) 180 capsule 3  . nitroGLYCERIN (NITROSTAT) 0.4 MG SL tablet Place 1 tablet (0.4 mg total) under the tongue every 5 (five) minutes as needed for chest pain. X 3 doses (Patient not taking: Reported on 12/16/2019) 25 tablet 3  . pantoprazole (PROTONIX) 40 MG tablet Take 1 tablet (40 mg total) by mouth daily at 6 (six) AM. (Patient not taking: Reported on 12/16/2019) 30 tablet 1  . PROAIR HFA 108 (90 Base) MCG/ACT inhaler INHALE 2 PUFFS BY MOUTH EVERY 4 HOURS AS NEEDED FOR WHEEZING AND FOR SHORTNESS OF BREATH (Patient taking differently: Inhale 2 puffs into the lungs every 4 (four) hours as needed for wheezing or shortness of breath. ) 27 g 0  . spironolactone (ALDACTONE) 25 MG tablet Take 0.5 tablets (12.5 mg total) by mouth daily. 30 tablet 1  . torsemide (DEMADEX) 20 MG tablet Take 4 tablets (80 mg total) by mouth 2 (two) times daily. 180 tablet 1   No current facility-administered medications for this visit.    Allergies:   Potassium-containing compounds, Neomycin-polymyxin b gu, Other, Azithromycin, Codeine, Darvon, Erythromycin, Meloxicam, Norco [hydrocodone-acetaminophen], Penicillins, Propoxyphene n-acetaminophen, Rofecoxib, Rosuvastatin, Statins, and Sulfa antibiotics   Social History:  The patient  reports that she quit smoking about 2 years ago. Her smoking use included cigarettes. She has a 31.00 pack-year smoking history. She has never used smokeless  tobacco. She  reports that she does not drink alcohol and does not use drugs.   Family History:  The patient's family history includes CAD in her father; Cancer in her sister; Colon cancer in her maternal grandmother; Deep vein thrombosis in her son; Diabetes in her sister; Heart disease in her father, mother, and sister; Hyperlipidemia in her father and another family member.  ROS:  Please see the history of present illness.  All other systems are reviewed and otherwise negative.   PHYSICAL EXAM:  VS:  There were no vitals taken for this visit. BMI: There is no height or weight on file to calculate BMI. Well nourished, well developed, in no acute distress  HEENT: normocephalic, atraumatic  Neck: no JVD, carotid bruits or masses Cardiac: *** RRR; no significant murmurs, no rubs, or gallops Lungs:  *** CTA b/l, no wheezing, rhonchi or rales  Abd: soft, nontender MS: no deformity, age appropriate/perhaps advancedatrophy Ext:  *** no edema  Skin: warm and dry, no rash Neuro:  No gross deficits appreciated Psych: euthymic mood, full affect   *** ICD site is stable, no tethering or discomfort   EKG:  ***  11/10/2019: SR, V paced, no ECG evidence of severe hyperkalemia (her labs pending from today)  ICD interrogation done today and reviewed by myself: ***  08/24/2019: TTE IMPRESSIONS  1. Left ventricular ejection fraction, by estimation, is 20 to 25%. The  left ventricle has severely decreased function. The left ventricle  demonstrates global hypokinesis. The left ventricular internal cavity size  was mildly dilated. Left ventricular  diastolic parameters are consistent with Grade II diastolic dysfunction  (pseudonormalization).  2. Right ventricular systolic function is normal. The right ventricular  size is normal. There is moderately elevated pulmonary artery systolic  pressure.  3. The mitral valve is normal in structure. Moderate mitral valve  regurgitation.  4. Tricuspid  valve regurgitation is mild to moderate.  5. The aortic valve is normal in structure. Aortic valve regurgitation is  trivial. No aortic stenosis is present.   Comparison(s): 07/20/17 EF 35-40%.      Recent Labs: 12/05/2019: ALT 14; B Natriuretic Peptide 1,385.6 12/08/2019: TSH 11.855 12/10/2019: Hemoglobin 14.9; Platelets 274 12/11/2019: Magnesium 2.2 12/12/2019: BUN 38; Creatinine, Ser 1.47; Potassium 3.7; Sodium 140  No results found for requested labs within last 8760 hours.   Estimated Creatinine Clearance: 31.4 mL/min (A) (by C-G formula based on SCr of 1.47 mg/dL (H)).   Wt Readings from Last 3 Encounters:  12/21/19 177 lb (80.3 kg)  12/12/19 178 lb 9.6 oz (81 kg)  11/10/19 176 lb (79.8 kg)     Other studies reviewed: Additional studies/records reviewed today include: summarized above  ASSESSMENT AND PLAN:  1. ICD     *** Intact function, no programming changes made  2. VF     *** Not recurrent remains on amiodarone, will continue BID for now  3. CAD     *** No anginal symptoms  4. ICM 5. chronic CHF (combined)     *** No symptoms or exam findings of volume OL, OptiVol is all the  Way to zero     ***   6. AKI/CKD 7. Hyperkalemia     ***    Disposition: ***   Current medicines are reviewed at length with the patient today.  The patient did not have any concerns regarding medicines.  Venetia Night, PA-C 12/21/2019 7:09 PM     [address] [place] [address] [phone])  [phone] (office)  [phone] (fax)

## 2019-12-21 NOTE — Progress Notes (Signed)
° °  Subjective:    Patient ID: Sydney Phillips, female    DOB: [birth year], 84 y.o.   MRN: [id number]  HPI Here to follow up from a hospital stay from 12-05-19 to 12-09-19 for an acute exacerbation of CHF. She had become very SOB, her weight had gone up from 179 to 188 lbs, and her oxygen sats on 2 liters had dropped to 77%. Her admission creatinine was 1.69. She was aggressively diuresed and she improved quickly. Her creatinine had returned to her baseline of 1.47 by the time of DC. She was sent home on 2 liters of oxygen. She denies any SOB. Her ankles have not been swelling. She was sent home on 80 mg of Torsemide BID and this appears to be the perfect dose. Unfortunately she lost her balance and fell about 3 weeks ago while on her patio, and she landed on her left arm. Since then she has had swelling and pain in the left elbow and wrist. She has a splint at home that she has been wearing.    Review of Systems  Constitutional: Negative.   Respiratory: Negative.   Cardiovascular: Negative.   Gastrointestinal: Negative.   Genitourinary: Negative.   Musculoskeletal: Positive for arthralgias.       Objective:   Physical Exam Constitutional:      General: She is not in acute distress.    Comments: In her wheelchair   Cardiovascular:     Rate and Rhythm: Normal rate and regular rhythm.     Pulses: Normal pulses.     Heart sounds: Normal heart sounds.  Pulmonary:     Effort: Pulmonary effort is normal. No respiratory distress.     Breath sounds: No stridor. No wheezing or rhonchi.     Comments: Bibasilar crackles  Musculoskeletal:     Right lower leg: No edema.     Left lower leg: No edema.     Comments: The left elbow appears normal, but it is tender and she has pain on full flexion and extension, and on internal/external rotation. The left wrist is mildly swollen and is mildly tender, ROM is limited by pain. No ecchymosis is seen   Neurological:     Mental Status: She is alert.            Assessment & Plan:  She has recovered from an acute exacerbation of CHF and of COPD. She has a nice fluid balance right now, and we will maintain the current dose of Torsemide. We will send her for Xrays of the left wrist and elbow to rule out fractures.  Alysia Penna, MD

## 2019-12-22 ENCOUNTER — Encounter: Payer: Medicare Other | Admitting: Physician Assistant

## 2019-12-23 ENCOUNTER — Telehealth: Payer: Self-pay

## 2019-12-23 ENCOUNTER — Ambulatory Visit: Payer: Medicare Other | Admitting: Student

## 2019-12-23 DIAGNOSIS — F419 Anxiety disorder, unspecified: Secondary | ICD-10-CM | POA: Insufficient documentation

## 2019-12-23 DIAGNOSIS — M797 Fibromyalgia: Secondary | ICD-10-CM | POA: Insufficient documentation

## 2019-12-23 DIAGNOSIS — E785 Hyperlipidemia, unspecified: Secondary | ICD-10-CM | POA: Insufficient documentation

## 2019-12-23 NOTE — Patient Instructions (Addendum)
Visit Information  Goals Addressed            This Visit's Progress   . Pharmacy Care Plan       CARE PLAN ENTRY (see longitudinal plan of care for additional care plan information)  Current Barriers:  . Chronic Disease Management support, education, and care coordination needs related to Hypertension, Hyperlipidemia, Diabetes, Heart Failure, Coronary Artery Disease, COPD, Chronic Kidney Disease, Anxiety, Gout, and V-tach, fibromyalgia, chronic rhinitis   Hypertension BP Readings from Last 3 Encounters:  12/21/19 120/60  12/12/19 133/74  11/10/19 126/68   . Pharmacist Clinical Goal(s): o Over the next 30 days, patient will work with PharmD and providers to maintain BP goal <130/80 . Current regimen:   Bisoprolol 5 mg,1 tablet once daily   Spironolactone 25mg , 0.5 (one-half) tablet once daily . Patient self care activities - Over the next 90 days, patient will: o Check BP daily, document, and provide at future appointments o Ensure daily salt intake < 2300 mg/day  Hyperlipidemia Lab Results  Component Value Date/Time   LDLCALC 140 (H) 12/19/2016 07:36 AM   LDLDIRECT 115.0 07/02/2017 11:20 AM   . Pharmacist Clinical Goal(s): o Over the next 90 days, patient will work with PharmD and providers to achieve LDL goal < 70 . Current regimen:  o No medications . Interventions: o Overdue for lipid panel. Recommend blood work for cholesterol levels.  . Patient self care activities - Over the next 90 days, patient will: o Modify diet (diet high in plant sterols (fruits/vegetables/nuts/whole grains/legumes) may reduce your cholesterol.  Encouraged increasing fiber to a daily intake of 10-25g/day   Diabetes Lab Results  Component Value Date/Time   HGBA1C 7.2 (H) 03/09/2019 04:12 PM   HGBA1C 7.6 (H) 05/10/2018 01:16 PM   . Pharmacist Clinical Goal(s): o Over the next 90 days, patient will work with PharmD and providers to achieve A1c goal <7% . Current regimen:  o No  medications  . Interventions: o We discussed: trialing other medications; patient would like to have blood work done first before considering other medications. o Recommend repeat blood work for A1c, microalbuminuria.  . Patient self care activities - Over the next 30 days, patient will: o Check blood sugar once daily, document, and provide at future appointments  Coronary artery disease . Pharmacist Clinical Goal(s) o Over the next 90 days, patient will work with PharmD and providers to prevent heart attacks, strokes, stent closure.  . Current regimen:  . Aspirin 81mg , 1 tablet once daily  . Clopidogrel 75mg ,1 tablet once daily  . Nitroglycerin 0.4mg  SL, place 1 tablet under tongue every five minutes as needed for chest pain x 3 doses . Patient self care activities - Over the next 90 days, patient will: o Continue current medications as instructed; managed by cardiologist.   COPD . Pharmacist Clinical Goal(s) o Over the next 90 days, patient will work with PharmD and providers to prevent worsening of shortness of breath and hospitalizations . Current regimen:   Albuterol HFA inhaler (Proair HFA), inhale 2 puffs every four hours as needed for wheezing and shortness of breath   Albuterol 0.083% nebulizer solution, use 1 vial in nebulizer every four hours as needed for wheezing or shortness of breath  . Patient self care activities o Patient will continue current medications as instructed.   Ventricular tachycardia (abnormal heart beats) . Pharmacist Clinical Goal(s) o Over the next 90 days, patient will work with PharmD and providers to maintain heart normal sinus rhythm.  Marland Kitchen  Current regimen:  o Amiodarone 200mg , 1 tablet twice daily  . Patient self care activities o Patient will continue current medications as instructed.   Gout . Pharmacist Clinical Goal(s) o Over the next 90 days, patient will work with PharmD and providers to prevent gout flare-ups.  . Current regimen:   o Allopurinol 300mg , 1 tablet once daily  . Patient self care activities o Patient will continue current medications as instructed.   Fibromyalgia . Pharmacist Clinical Goal(s) o Over the next 90 days, patient will work with PharmD and providers to maintain fibromyalgia symptoms controlled.  . Current regimen:  o Duloxetine 60mg , 1 capsule twice daily . Patient self care activities o Patient will continue current medications as instructed.   Combined systolic and diastolic heart failure . Pharmacist Clinical Goal(s) o Over the next 90 days, patient will work with PharmD and providers to check weight daily and contact physician if weight gain of more than 3 pounds overnight or more than 5 pounds in a week. . Current regimen:   Bisoprolol 5 mg,1 tablet once daily   Spironolactone 25mg , 0.5 (one-half) tablet once daily  Torsemide 20mg , 4 tablets twice daily . Interventions: o We discussed weighing daily; if you gain more than 3 pounds in one day or 5 pounds in one week call your doctor . Patient self care activities o Patient will continue current medications and follow up with cardiology if any changes.   Anxiety . Pharmacist Clinical Goal(s) o Over the next 90 days, patient will work with PharmD and providers to minimize anxiety symptoms.  . Current regimen:  o Alprazolam 1mg , 1 tablet every mornign and 1.5 (one and one-half) tablet at night as needed for anxiety (only takes nighttime dose)  . Patient self care activities o Patient will continue medications as directed   Chronic rhinitis . Pharmacist Clinical Goal(s) o Over the next 90 days, patient will work with PharmD and providers to minimize allergy symptoms.  . Current regimen:  . Azelastine (Astelin) 0.1% nasal spray, use 2 sprays in each nostril twice daily  As directed  . Guaifenesin (Mucinex) 600mg , 2 tablets twice daily as needed . Loratadine 10mg , 1 tablet once daily  . Patient self care activities o Patient  will continue current medications as instructed.   Chronic kidney disease, stage 3 . Pharmacist Clinical Goal(s) o Over the next 90 days, patient will work with PharmD and providers to maintain stable kidney function.  . Patient self care activities o Patient will avoid medications like NSAIDs (ex. Ibuprofen, naproxen, etc.)  Medication management . Pharmacist Clinical Goal(s): o Over the next 90 days, patient will work with PharmD and providers to maintain optimal medication adherence . Current pharmacy: [facility] . Interventions o Comprehensive medication review performed. o Utilize [facility] for medication synchronization, packaging and delivery . Patient self care activities - Over the next 90 days, patient will: o Take medications as prescribed o Report any questions or concerns to PharmD and/or provider(s)  Initial goal documentation        Sydney Phillips was given information about Chronic Care Management services today including:  1. CCM service includes personalized support from designated clinical staff supervised by her physician, including individualized plan of care and coordination with other care providers 2. 24/7 contact phone numbers for assistance for urgent and routine care needs. 3. Standard insurance, coinsurance, copays and deductibles apply for chronic care management only during months in which we provide at least 20 minutes of these services.  Most insurances cover these services at 100%, however patients may be responsible for any copay, coinsurance and/or deductible if applicable. This service may help you avoid the need for more expensive face-to-face services. 4. Only one practitioner may furnish and bill the service in a calendar month. 5. The patient may stop CCM services at any time (effective at the end of the month) by phone call to the office staff.  Patient agreed to services and verbal consent obtained.   The patient verbalized  understanding of instructions provided today and agreed to receive a mailed copy of patient instruction and/or educational materials. The pharmacy team will reach out to the patient again over the next 30 days.    Anson Crofts, PharmD Clinical Pharmacist [facility] [place] [phone]

## 2019-12-26 ENCOUNTER — Other Ambulatory Visit: Payer: Self-pay

## 2019-12-26 ENCOUNTER — Ambulatory Visit (INDEPENDENT_AMBULATORY_CARE_PROVIDER_SITE_OTHER)
Admission: RE | Admit: 2019-12-26 | Discharge: 2019-12-26 | Disposition: A | Discharge status: Home / Self Care | Payer: Medicare Other | Source: Ambulatory Visit | Attending: Family Medicine | Admitting: Family Medicine

## 2019-12-26 DIAGNOSIS — S59902A Unspecified injury of left elbow, initial encounter: Secondary | ICD-10-CM | POA: Diagnosis not present

## 2019-12-26 DIAGNOSIS — I472 Ventricular tachycardia: Secondary | ICD-10-CM | POA: Diagnosis not present

## 2019-12-26 DIAGNOSIS — M5127 Other intervertebral disc displacement, lumbosacral region: Secondary | ICD-10-CM | POA: Diagnosis not present

## 2019-12-26 DIAGNOSIS — M25532 Pain in left wrist: Secondary | ICD-10-CM

## 2019-12-26 DIAGNOSIS — M7989 Other specified soft tissue disorders: Secondary | ICD-10-CM | POA: Diagnosis not present

## 2019-12-26 DIAGNOSIS — I5021 Acute systolic (congestive) heart failure: Secondary | ICD-10-CM | POA: Diagnosis not present

## 2019-12-26 DIAGNOSIS — J439 Emphysema, unspecified: Secondary | ICD-10-CM | POA: Diagnosis not present

## 2019-12-26 DIAGNOSIS — M25522 Pain in left elbow: Secondary | ICD-10-CM

## 2019-12-29 ENCOUNTER — Telehealth: Payer: Self-pay | Admitting: Family Medicine

## 2019-12-29 ENCOUNTER — Telehealth: Payer: Medicare Other

## 2019-12-29 NOTE — Telephone Encounter (Signed)
Pt returning call

## 2019-12-29 NOTE — Telephone Encounter (Signed)
Patient called wanting to know if they were going to call her back with the X-Ray results.   I advised the patient the Dr. Sarajane Jews and his CMA are gone for today and she would like a call back tomorrow after 12 PM because she likes to sleep in late.  Please advise

## 2019-12-30 ENCOUNTER — Telehealth: Payer: Medicare Other

## 2019-12-30 NOTE — Telephone Encounter (Signed)
See result notes for further documentation.  

## 2020-01-02 ENCOUNTER — Telehealth: Payer: Medicare Other

## 2020-01-03 ENCOUNTER — Telehealth: Payer: Self-pay | Admitting: Family Medicine

## 2020-01-03 NOTE — Telephone Encounter (Signed)
Pt stated she mailed the tax forum for Dr. Sarajane Jews to fill out stating why she is on disability so that why she can get her mortgage payment cut in half. She is calling to see If it has been received and if so she needs it signed and mailed back to her.

## 2020-01-03 NOTE — Telephone Encounter (Signed)
Patient return call.  Patient decided to manage her own medications and requested to opt out of CCM.   Forwarding information to scheduling team.

## 2020-01-03 NOTE — Addendum Note (Signed)
Addended by: Alysia Penna A on: 01/03/2020 01:09 PM   Modules accepted: Orders

## 2020-01-03 NOTE — Telephone Encounter (Signed)
Called patient and left messages to review medication costs between [facility] and [facility] since cost is a concern for patient before transitioning to [facility].   Left message for patient to return call.

## 2020-01-04 ENCOUNTER — Telehealth: Payer: Medicare Other

## 2020-01-04 NOTE — Telephone Encounter (Signed)
No I have not seen this.

## 2020-01-04 NOTE — Telephone Encounter (Signed)
Left a detailed message on verified voice mail informing her that we have not received these forms.

## 2020-01-09 ENCOUNTER — Ambulatory Visit (INDEPENDENT_AMBULATORY_CARE_PROVIDER_SITE_OTHER): Payer: Medicare Other

## 2020-01-09 DIAGNOSIS — Z95810 Presence of automatic (implantable) cardiac defibrillator: Secondary | ICD-10-CM | POA: Diagnosis not present

## 2020-01-09 DIAGNOSIS — I5042 Chronic combined systolic (congestive) and diastolic (congestive) heart failure: Secondary | ICD-10-CM | POA: Diagnosis not present

## 2020-01-10 ENCOUNTER — Telehealth: Payer: Self-pay

## 2020-01-10 DIAGNOSIS — M5127 Other intervertebral disc displacement, lumbosacral region: Secondary | ICD-10-CM | POA: Diagnosis not present

## 2020-01-10 DIAGNOSIS — J449 Chronic obstructive pulmonary disease, unspecified: Secondary | ICD-10-CM | POA: Diagnosis not present

## 2020-01-10 DIAGNOSIS — J439 Emphysema, unspecified: Secondary | ICD-10-CM | POA: Diagnosis not present

## 2020-01-10 NOTE — Telephone Encounter (Signed)
Received notification from [facility] clinic that pt had reported treated VF episodes on transmission.    On 7/18 it appears pt had multiple episodes of NSVT and VT/ VF between the hours of 0100 and 1130 resulting in 3 separate shocks.  There is no record of pt calling our office to report shocks or recent hospitalization within [facility] system.   Pt also had a treated episode on 7/15 at 0131  Patient medication list includes Amiodarone 200mg  BID since May hospitalization, it is not clear what dosage she is currently taking, although Hospital D/C on 12/12/19 indicates continuing the BID dosing.    Attempted to reach pt to determine symptoms and medication dosage/compliance.  No answer, left message with DC # and hours to return call. Patient is scheduled for f/u appt on 8/6 with Dr. Sallyanne Kuster.

## 2020-01-10 NOTE — Progress Notes (Signed)
EPIC Encounter for ICM Monitoring  Patient Name: Sydney Phillips is a 84 y.o. female Date: 01/10/2020 Primary Care Physican: Laurey Morale, MD Primary Cardiologist:Croitoru Electrophysiologist: Croitoru BiV Pacing: 97.6% 7/7/2021Office Weight:177lbs(baseline 171-176 lbs)  Clinical Status (08-Dec-2019 to 09-Jan-2020)  Treated VT/VF        4 episodes      Attempted call to patient and unable to reach.   Transmission reviewed.   OptivolThoracic impedanceshows an ongoing pattern of significant decreased impedance episodes with a few days at baseline since September 2020.  Message sent to device clinic triage to review treated VT/VF episodes.  Prescribed:  Torsemide 20 mg4 tablets(80 mg total)by mouthtwice a day  Spironolactone 25 mg take 0.5 tablet (12.5 mg total) by mouth once a day  Labs: 12/12/2019 Creatinine 1.47, BUN 38, Potassium 3.7, Sodium 140, GFR 33-38 12/11/2019 Creatinine 1.45, BUN 34, Potassium 4.1, Sodium 139, GFR 34-39  12/10/2019 Creatinine 1.43, BUN 35, Potassium 3.6, Sodium 141, GFR 34-40  12/09/2019 Creatinine 1.43, BUN 35, Potassium 3.6, Sodium 141, GFR 37-43  12/08/2019 Creatinine 1.69, BUN 33, Potassium 3.7, Sodium 141, GFR 28-32  12/07/2019 Creatinine 1.45, BUN 33, Potassium 4.1, Sodium 140, GFR 34-39  12/06/2019 Creatinine 1.49, BUN 19, Potassium 3.8, Sodium 143, GFR 33-38  12/05/2019 Creatinine 1.24, BUN 15, Potassium 4.6, Sodium 143, GFR 41-47  11/16/2019 Creatinine 1.52, BUN 43, Potassium 4.1, Sodium 144, GFR 32-37  11/10/2019 Creatinine 1.93, BUN 63, Potassium 5.0, Sodium 138, GFR 24-28  A complete set of results can be found in Results Review.  Recommendations:Unable to reach.    Follow-up plan: ICM clinic phone appointment on8/07/2019 to recheck fluid levels.91 day device clinic remote transmission8/25/2021.  EP/Cardiology Office Visits: 01/20/2020 with Dr. Sallyanne Kuster.    Copy of ICM check sent to Dr. Sallyanne Kuster.    3 month ICM trend: 01/09/2020    1 Year ICM trend:       [name], RN 01/10/2020 9:34 AM

## 2020-01-10 NOTE — Telephone Encounter (Signed)
Wow. Scary  that we cannot reach her.

## 2020-01-10 NOTE — Telephone Encounter (Signed)
The pt was returning Amy phone call. I told her amy will be calling her back in a few minutes.

## 2020-01-10 NOTE — Telephone Encounter (Signed)
Remote ICM transmission received.  Attempted call to patient regarding ICM remote transmission and no answer.  

## 2020-01-10 NOTE — Telephone Encounter (Signed)
Returned pt call.  She stated she discovered this morning that her remote was unplugged for a lamp so she plugged it back in.  This explains why the transmission only came through today.  Advised pt of event occurring on 7/18.  Pt stated that she did not have any symptoms and was not aware of receiving a shock.  She believes at that time she would have been sleeping.  Pt confirmed that she is taking Amiodarone 200mg  BID as she read it from the bottle.    Pt denies any current cardiac symptoms.    Advised pt it is important that she keep the appt currently scheduled for 8/6 at 8:20am with Dr. Sallyanne Kuster.  Pt appeared to be upset with the time of this appt stated she cannot get a ride that early in the morning.  Offered to have Dr. Victorino December nurse look if there is any other appts avail in the next couple of weeks.  Pt declined and stated she will try to make the appt.    Educated pt on DMV restrictions for no driving for 6 months, pt reports that she is not currently driving.

## 2020-01-11 NOTE — Telephone Encounter (Signed)
Left a message for the patient to call back.  

## 2020-01-11 NOTE — Telephone Encounter (Signed)
I will try to get her a later appointment... Sydney Phillips, can we move her to the end of that morning schedule, please?

## 2020-01-12 ENCOUNTER — Emergency Department (HOSPITAL_COMMUNITY): Payer: Medicare Other

## 2020-01-12 ENCOUNTER — Encounter (HOSPITAL_COMMUNITY): Payer: Self-pay | Admitting: Emergency Medicine

## 2020-01-12 ENCOUNTER — Other Ambulatory Visit: Payer: Self-pay

## 2020-01-12 ENCOUNTER — Inpatient Hospital Stay (HOSPITAL_COMMUNITY)
Admission: EM | Admit: 2020-01-12 | Discharge: 2020-01-15 | DRG: 291 | Disposition: A | Discharge status: Home / Self Care | Payer: Medicare Other | Attending: Cardiovascular Disease | Admitting: Cardiovascular Disease

## 2020-01-12 DIAGNOSIS — Z882 Allergy status to sulfonamides status: Secondary | ICD-10-CM

## 2020-01-12 DIAGNOSIS — I5042 Chronic combined systolic (congestive) and diastolic (congestive) heart failure: Secondary | ICD-10-CM

## 2020-01-12 DIAGNOSIS — Z87891 Personal history of nicotine dependence: Secondary | ICD-10-CM

## 2020-01-12 DIAGNOSIS — G8929 Other chronic pain: Secondary | ICD-10-CM | POA: Diagnosis present

## 2020-01-12 DIAGNOSIS — R911 Solitary pulmonary nodule: Secondary | ICD-10-CM | POA: Diagnosis not present

## 2020-01-12 DIAGNOSIS — G4733 Obstructive sleep apnea (adult) (pediatric): Secondary | ICD-10-CM | POA: Diagnosis present

## 2020-01-12 DIAGNOSIS — F0390 Unspecified dementia without behavioral disturbance: Secondary | ICD-10-CM | POA: Diagnosis present

## 2020-01-12 DIAGNOSIS — I130 Hypertensive heart and chronic kidney disease with heart failure and stage 1 through stage 4 chronic kidney disease, or unspecified chronic kidney disease: Principal | ICD-10-CM | POA: Diagnosis present

## 2020-01-12 DIAGNOSIS — E1122 Type 2 diabetes mellitus with diabetic chronic kidney disease: Secondary | ICD-10-CM | POA: Diagnosis present

## 2020-01-12 DIAGNOSIS — I447 Left bundle-branch block, unspecified: Secondary | ICD-10-CM | POA: Diagnosis present

## 2020-01-12 DIAGNOSIS — I2510 Atherosclerotic heart disease of native coronary artery without angina pectoris: Secondary | ICD-10-CM | POA: Diagnosis not present

## 2020-01-12 DIAGNOSIS — I255 Ischemic cardiomyopathy: Secondary | ICD-10-CM | POA: Diagnosis present

## 2020-01-12 DIAGNOSIS — E039 Hypothyroidism, unspecified: Secondary | ICD-10-CM | POA: Diagnosis not present

## 2020-01-12 DIAGNOSIS — Z7902 Long term (current) use of antithrombotics/antiplatelets: Secondary | ICD-10-CM

## 2020-01-12 DIAGNOSIS — H9190 Unspecified hearing loss, unspecified ear: Secondary | ICD-10-CM | POA: Diagnosis not present

## 2020-01-12 DIAGNOSIS — J9611 Chronic respiratory failure with hypoxia: Secondary | ICD-10-CM | POA: Diagnosis present

## 2020-01-12 DIAGNOSIS — N1832 Chronic kidney disease, stage 3b: Secondary | ICD-10-CM | POA: Diagnosis not present

## 2020-01-12 DIAGNOSIS — Z888 Allergy status to other drugs, medicaments and biological substances status: Secondary | ICD-10-CM

## 2020-01-12 DIAGNOSIS — R0789 Other chest pain: Secondary | ICD-10-CM | POA: Diagnosis not present

## 2020-01-12 DIAGNOSIS — Z4502 Encounter for adjustment and management of automatic implantable cardiac defibrillator: Secondary | ICD-10-CM | POA: Diagnosis not present

## 2020-01-12 DIAGNOSIS — Z79899 Other long term (current) drug therapy: Secondary | ICD-10-CM

## 2020-01-12 DIAGNOSIS — S62102A Fracture of unspecified carpal bone, left wrist, initial encounter for closed fracture: Secondary | ICD-10-CM

## 2020-01-12 DIAGNOSIS — E785 Hyperlipidemia, unspecified: Secondary | ICD-10-CM | POA: Diagnosis not present

## 2020-01-12 DIAGNOSIS — N183 Chronic kidney disease, stage 3 unspecified: Secondary | ICD-10-CM | POA: Diagnosis present

## 2020-01-12 DIAGNOSIS — M797 Fibromyalgia: Secondary | ICD-10-CM | POA: Diagnosis not present

## 2020-01-12 DIAGNOSIS — M19032 Primary osteoarthritis, left wrist: Secondary | ICD-10-CM | POA: Diagnosis not present

## 2020-01-12 DIAGNOSIS — I472 Ventricular tachycardia, unspecified: Secondary | ICD-10-CM

## 2020-01-12 DIAGNOSIS — K219 Gastro-esophageal reflux disease without esophagitis: Secondary | ICD-10-CM | POA: Diagnosis present

## 2020-01-12 DIAGNOSIS — T82897A Other specified complication of cardiac prosthetic devices, implants and grafts, initial encounter: Secondary | ICD-10-CM | POA: Diagnosis not present

## 2020-01-12 DIAGNOSIS — Z95828 Presence of other vascular implants and grafts: Secondary | ICD-10-CM

## 2020-01-12 DIAGNOSIS — G4700 Insomnia, unspecified: Secondary | ICD-10-CM | POA: Diagnosis present

## 2020-01-12 DIAGNOSIS — E876 Hypokalemia: Secondary | ICD-10-CM | POA: Diagnosis not present

## 2020-01-12 DIAGNOSIS — S6992XA Unspecified injury of left wrist, hand and finger(s), initial encounter: Secondary | ICD-10-CM | POA: Diagnosis not present

## 2020-01-12 DIAGNOSIS — K5909 Other constipation: Secondary | ICD-10-CM | POA: Diagnosis present

## 2020-01-12 DIAGNOSIS — E1151 Type 2 diabetes mellitus with diabetic peripheral angiopathy without gangrene: Secondary | ICD-10-CM | POA: Diagnosis present

## 2020-01-12 DIAGNOSIS — Z955 Presence of coronary angioplasty implant and graft: Secondary | ICD-10-CM

## 2020-01-12 DIAGNOSIS — I509 Heart failure, unspecified: Secondary | ICD-10-CM | POA: Diagnosis not present

## 2020-01-12 DIAGNOSIS — J90 Pleural effusion, not elsewhere classified: Secondary | ICD-10-CM | POA: Diagnosis not present

## 2020-01-12 DIAGNOSIS — Z95810 Presence of automatic (implantable) cardiac defibrillator: Secondary | ICD-10-CM

## 2020-01-12 DIAGNOSIS — I252 Old myocardial infarction: Secondary | ICD-10-CM | POA: Diagnosis not present

## 2020-01-12 DIAGNOSIS — Z95820 Peripheral vascular angioplasty status with implants and grafts: Secondary | ICD-10-CM

## 2020-01-12 DIAGNOSIS — Z8249 Family history of ischemic heart disease and other diseases of the circulatory system: Secondary | ICD-10-CM

## 2020-01-12 DIAGNOSIS — F418 Other specified anxiety disorders: Secondary | ICD-10-CM | POA: Diagnosis present

## 2020-01-12 DIAGNOSIS — Z880 Allergy status to penicillin: Secondary | ICD-10-CM

## 2020-01-12 DIAGNOSIS — I10 Essential (primary) hypertension: Secondary | ICD-10-CM | POA: Diagnosis present

## 2020-01-12 DIAGNOSIS — I272 Pulmonary hypertension, unspecified: Secondary | ICD-10-CM | POA: Diagnosis present

## 2020-01-12 DIAGNOSIS — Z885 Allergy status to narcotic agent status: Secondary | ICD-10-CM

## 2020-01-12 DIAGNOSIS — I5043 Acute on chronic combined systolic (congestive) and diastolic (congestive) heart failure: Secondary | ICD-10-CM | POA: Diagnosis present

## 2020-01-12 DIAGNOSIS — Z20822 Contact with and (suspected) exposure to covid-19: Secondary | ICD-10-CM | POA: Diagnosis not present

## 2020-01-12 DIAGNOSIS — J449 Chronic obstructive pulmonary disease, unspecified: Secondary | ICD-10-CM | POA: Diagnosis not present

## 2020-01-12 DIAGNOSIS — E875 Hyperkalemia: Secondary | ICD-10-CM | POA: Diagnosis present

## 2020-01-12 DIAGNOSIS — Z9981 Dependence on supplemental oxygen: Secondary | ICD-10-CM

## 2020-01-12 DIAGNOSIS — R42 Dizziness and giddiness: Secondary | ICD-10-CM | POA: Diagnosis present

## 2020-01-12 DIAGNOSIS — Z886 Allergy status to analgesic agent status: Secondary | ICD-10-CM

## 2020-01-12 DIAGNOSIS — J310 Chronic rhinitis: Secondary | ICD-10-CM | POA: Diagnosis present

## 2020-01-12 DIAGNOSIS — I739 Peripheral vascular disease, unspecified: Secondary | ICD-10-CM | POA: Diagnosis not present

## 2020-01-12 DIAGNOSIS — S52615A Nondisplaced fracture of left ulna styloid process, initial encounter for closed fracture: Secondary | ICD-10-CM | POA: Diagnosis present

## 2020-01-12 DIAGNOSIS — Z881 Allergy status to other antibiotic agents status: Secondary | ICD-10-CM

## 2020-01-12 DIAGNOSIS — I25810 Atherosclerosis of coronary artery bypass graft(s) without angina pectoris: Secondary | ICD-10-CM | POA: Diagnosis not present

## 2020-01-12 DIAGNOSIS — W1830XA Fall on same level, unspecified, initial encounter: Secondary | ICD-10-CM | POA: Diagnosis present

## 2020-01-12 DIAGNOSIS — I517 Cardiomegaly: Secondary | ICD-10-CM | POA: Diagnosis not present

## 2020-01-12 DIAGNOSIS — Z7982 Long term (current) use of aspirin: Secondary | ICD-10-CM

## 2020-01-12 DIAGNOSIS — R079 Chest pain, unspecified: Secondary | ICD-10-CM | POA: Diagnosis not present

## 2020-01-12 DIAGNOSIS — Z90710 Acquired absence of both cervix and uterus: Secondary | ICD-10-CM

## 2020-01-12 DIAGNOSIS — R946 Abnormal results of thyroid function studies: Secondary | ICD-10-CM

## 2020-01-12 HISTORY — DX: Unspecified hearing loss, unspecified ear: H91.90

## 2020-01-12 HISTORY — DX: Encounter for adjustment and management of automatic implantable cardiac defibrillator: Z45.02

## 2020-01-12 LAB — CBC
HCT: 46.5 % — ABNORMAL HIGH (ref 36.0–46.0)
Hemoglobin: 14.2 g/dL (ref 12.0–15.0)
MCH: 31.2 pg (ref 26.0–34.0)
MCHC: 30.5 g/dL (ref 30.0–36.0)
MCV: 102.2 fL — ABNORMAL HIGH (ref 80.0–100.0)
Platelets: 247 10*3/uL (ref 150–400)
RBC: 4.55 MIL/uL (ref 3.87–5.11)
RDW: 15.2 % (ref 11.5–15.5)
WBC: 8.4 10*3/uL (ref 4.0–10.5)
nRBC: 0 % (ref 0.0–0.2)

## 2020-01-12 LAB — BASIC METABOLIC PANEL
Anion gap: 13 (ref 5–15)
BUN: 23 mg/dL (ref 8–23)
CO2: 33 mmol/L — ABNORMAL HIGH (ref 22–32)
Calcium: 9.4 mg/dL (ref 8.9–10.3)
Chloride: 96 mmol/L — ABNORMAL LOW (ref 98–111)
Creatinine, Ser: 1.51 mg/dL — ABNORMAL HIGH (ref 0.44–1.00)
GFR calc Af Amer: 37 mL/min — ABNORMAL LOW (ref >60)
GFR calc non Af Amer: 32 mL/min — ABNORMAL LOW (ref >60)
Glucose, Bld: 162 mg/dL — ABNORMAL HIGH (ref 70–99)
Potassium: 3.1 mmol/L — ABNORMAL LOW (ref 3.5–5.1)
Sodium: 142 mmol/L (ref 135–145)

## 2020-01-12 LAB — TROPONIN I (HIGH SENSITIVITY)
Troponin I (High Sensitivity): 24 ng/L — ABNORMAL HIGH (ref <18)
Troponin I (High Sensitivity): 22 ng/L — ABNORMAL HIGH (ref <18)

## 2020-01-12 LAB — SARS CORONAVIRUS 2 BY RT PCR (HOSPITAL ORDER, PERFORMED IN ~~LOC~~ HOSPITAL LAB): SARS Coronavirus 2: NEGATIVE

## 2020-01-12 LAB — GLUCOSE, CAPILLARY: Glucose-Capillary: 149 mg/dL — ABNORMAL HIGH (ref 70–99)

## 2020-01-12 MED ORDER — POTASSIUM CHLORIDE CRYS ER 20 MEQ PO TBCR
40.0000 meq | EXTENDED_RELEASE_TABLET | Freq: Three times a day (TID) | ORAL | Status: AC
  Administered 2020-01-12 – 2020-01-13 (×3): 40 meq via ORAL
  Filled 2020-01-12 (×3): qty 2

## 2020-01-12 MED ORDER — SODIUM CHLORIDE 0.9% FLUSH: 3.0000 mL | Freq: Once | INTRAVENOUS | Status: DC

## 2020-01-12 MED ORDER — SODIUM CHLORIDE 0.9% FLUSH: 3.0000 mL | INTRAVENOUS | Status: DC | PRN

## 2020-01-12 MED ORDER — SODIUM CHLORIDE 0.9 % IV SOLN: 250.0000 mL | INTRAVENOUS | Status: DC | PRN

## 2020-01-12 MED ORDER — VITAMIN B-12 1000 MCG PO TABS
5000.0000 ug | ORAL_TABLET | Freq: Every day | ORAL | Status: DC
  Administered 2020-01-12 – 2020-01-15 (×4): 5000 ug via ORAL
  Filled 2020-01-12 (×4): qty 5

## 2020-01-12 MED ORDER — ONDANSETRON HCL 4 MG/2ML IJ SOLN: 4.0000 mg | Freq: Four times a day (QID) | INTRAMUSCULAR | Status: DC | PRN

## 2020-01-12 MED ORDER — HEPARIN SODIUM (PORCINE) 5000 UNIT/ML IJ SOLN
5000.0000 [IU] | Freq: Three times a day (TID) | INTRAMUSCULAR | Status: DC
  Administered 2020-01-12 – 2020-01-15 (×8): 5000 [IU] via SUBCUTANEOUS
  Filled 2020-01-12 (×8): qty 1

## 2020-01-12 MED ORDER — CLOPIDOGREL BISULFATE 75 MG PO TABS
75.0000 mg | ORAL_TABLET | Freq: Every day | ORAL | Status: DC
  Administered 2020-01-12 – 2020-01-15 (×4): 75 mg via ORAL
  Filled 2020-01-12 (×4): qty 1

## 2020-01-12 MED ORDER — AMIODARONE HCL 200 MG PO TABS
200.0000 mg | ORAL_TABLET | Freq: Two times a day (BID) | ORAL | Status: DC
  Administered 2020-01-12 – 2020-01-15 (×7): 200 mg via ORAL
  Filled 2020-01-12 (×8): qty 1

## 2020-01-12 MED ORDER — SODIUM CHLORIDE 0.9% FLUSH
3.0000 mL | Freq: Two times a day (BID) | INTRAVENOUS | Status: DC
  Administered 2020-01-12 – 2020-01-14 (×6): 3 mL via INTRAVENOUS

## 2020-01-12 MED ORDER — FUROSEMIDE 10 MG/ML IJ SOLN
120.0000 mg | Freq: Two times a day (BID) | INTRAVENOUS | Status: DC
  Administered 2020-01-12 – 2020-01-13 (×2): 120 mg via INTRAVENOUS
  Filled 2020-01-12: qty 10
  Filled 2020-01-12: qty 12
  Filled 2020-01-12: qty 10

## 2020-01-12 MED ORDER — POTASSIUM CHLORIDE CRYS ER 20 MEQ PO TBCR: 40.0000 meq | EXTENDED_RELEASE_TABLET | Freq: Three times a day (TID) | ORAL | Status: DC

## 2020-01-12 MED ORDER — BISOPROLOL FUMARATE 5 MG PO TABS
5.0000 mg | ORAL_TABLET | Freq: Every day | ORAL | Status: DC
  Administered 2020-01-12 – 2020-01-15 (×4): 5 mg via ORAL
  Filled 2020-01-12 (×4): qty 1

## 2020-01-12 NOTE — ED Provider Notes (Signed)
[facility] EMERGENCY DEPARTMENT Provider Note   CSN: [id number] Arrival date & time: 01/12/20  0447     History Chief Complaint  Patient presents with  . Chest Pain    Sydney Phillips is a 84 y.o. female presenting for evaluation after AICD fired.   Pt states last night she was getting back into bed when she felt a hammer hitting her chest x2. She has had no chest discomfrot since then. She denies cough, sob, n/v, abd pain, urinary sxs, abnormal BMs. She denies recent medication changes. She follows with Dr. Sallyanne Kuster from cardiology.   Additional history obtained from chart review.  History of CAD, cardiomyopathy, CHF, CKD, COPD, dementia, depression, GERD, fibromyalgia, diabetes, hld.   Reviewed messages in chart, which indicate that patient has had several shocks in the past month that were unreported/not felt. She has a f/u apt with cardiology on 08/06.   HPI     Past Medical History:  Diagnosis Date  . Anxiety   . Arthritis   . Asthma   . Automatic implantable cardioverter-defibrillator in situ   . Bronchitis   . CAD (coronary artery disease)    a. s/p DES to LCx/RCA 05/2016, ostial LAD disease.  . Cardiomyopathy ([place])   . Chronic back pain   . Chronic combined systolic and diastolic CHF (congestive heart failure) ([place])   . Chronic constipation   . Chronic pain   . CKD (chronic kidney disease), stage III   . Colon polyps 2003.  2015.   HP polyps 2003.  adnomas 2015.  required referal to [facility] for colonoscopic resection of flat polyps.   Marland Kitchen COPD (chronic obstructive pulmonary disease) ([place])   . Dementia ([place])   . Depression   . Depression with anxiety    takes Cymbalta daily  . Diabetes mellitus ([place])   . Dyspnea   . Early cataracts, bilateral   . Fibromyalgia   . GERD (gastroesophageal reflux disease)    was on meds but was taken off;now watches what she eats  . Hemorrhoids   . History of kidney stones   . Hx of colonic polyps   .  Hyperlipemia    takes Crestor daily  . Hypertension    takes Amlodipine and Metoprolol daily  . Insomnia   . LBBB (left bundle branch block)    Stress test 09/03/2010, EF 55  . Myocardial infarction ([place])   . PAD (peripheral artery disease) (HCC)    Carotid, subclavian, and lower extremity beds, currently not symptomatic  . Presence of combination internal cardiac defibrillator (ICD) and pacemaker   . Presence of permanent cardiac pacemaker   . Pulmonary hypertension ([place])   . S/P angioplasty with stent, lt. subclavian 07/31/11 08/01/2011  . Subclavian arterial stenosis, lt, with PTA/STENT 07/31/11 08/01/2011  . Syncope 07/28/2011   EF - 50-55, moderate concentric hypertrophy in left ventricle  . Urinary incontinence   . Vertigo    takes Meclizine prn    Patient Active Problem List   Diagnosis Date Noted  . Hyperlipemia   . Fibromyalgia   . Anxiety   . Acute on chronic congestive heart failure ([place]) 12/05/2019  . V-tach ([place]) 10/22/2019  . Type 2 diabetes mellitus with diabetic chronic kidney disease ([place]) 01/05/2019  . Diabetic foot ulcers ([place]) 06/18/2018  . Rhinitis, chronic 09/29/2017  . Hearing loss 09/04/2017  . Perforation of left tympanic membrane 09/04/2017  . Acute exacerbation of CHF (congestive heart failure) ([place]) 07/19/2017  . Chronic combined  systolic and diastolic heart failure ([place]) 07/19/2017  . Acute on chronic respiratory failure with hypoxia and hypercapnia ([place]) 07/19/2017  . Poor dentition 04/01/2017  . Tobacco abuse 12/17/2016  . 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 Chronic respiratory failure with hypoxia ([place])   . Acute encephalopathy 05/30/2016  . CKD (chronic kidney disease), stage III 05/30/2016  . Leukocytosis   . Stenosis of left subclavian artery ([place]) 05/19/2016  . Somnolence   . OSA and COPD overlap syndrome ([place])   . Cardiomyopathy, ischemic   . Ischemic mitral valve regurgitation   . Pulmonary hypertension ([place])   .  CAD S/P percutaneous coronary angioplasty   . Altered mental state 02/26/2016  . Arthralgia 10/12/2015  . Left carotid stenosis 11/08/2014  . Iron deficiency anemia 04/17/2014  . Carotid artery disease ([place]) 04/17/2014  . LBBB (left bundle branch block) 04/16/2014  . Diastolic dysfunction 17/51/0258  . NSTEMI (non-ST elevated myocardial infarction) ([place]) 01/20/2014  . Melena 01/20/2014  . Microcytic anemia 01/19/2014  . Colon polyp 03/24/2013  . S/P angioplasty with stent, lt. subclavian 07/31/11 08/01/2011  . Implantable cardioverter-defibrillator (ICD) in situ 07/28/2011  . PVD, 07/28/2011  . Vertigo 07/27/2011  . Hypokalemia 07/27/2011  . SPINAL STENOSIS 01/28/2010  . Chronic constipation 01/14/2010  . Insomnia 01/23/2009  . HIP PAIN, BILATERAL 06/21/2008  . Coronary atherosclerosis 03/30/2008  . Other primary cardiomyopathies 03/30/2008  . Myalgia and myositis 11/26/2007  . Other and unspecified hyperlipidemia 07/28/2007  . WEIGHT GAIN 06/04/2007  . COPD (chronic obstructive pulmonary disease) ([place]) 03/02/2007  . Depression with anxiety 02/25/2007  . Hypertension 02/25/2007  . GERD 02/25/2007  . COLONIC POLYPS, HX OF 02/25/2007    Past Surgical History:  Procedure Laterality Date  . ABDOMINAL AORTAGRAM N/A 08/12/2013   Procedure: ABDOMINAL Maxcine Ham;  Surgeon: Elam Dutch, MD;  Location: [facility] C[facility] LAB;  Service: Cardiovascular;  Laterality: N/A;  . ABDOMINAL HYSTERECTOMY    . APPENDECTOMY    . BACK SURGERY  2012  . BIV ICD GENERTAOR CHANGE OUT Left 02/20/2012   Procedure: BIV ICD GENERTAOR CHANGE OUT;  Surgeon: [name], MD;  Location: [facility] CATH LAB;  Service: Cardiovascular;  Laterality: Left;  . CARDIAC CATHETERIZATION  12/01/2007   By Dr. Melvern Banker, left heart cath,   . CARDIAC CATHETERIZATION N/A 05/20/2016   Procedure: Right/Left Heart Cath and Coronary Angiography;  Surgeon: Sherren Mocha, MD;  Location: [facility] CV LAB;  Service: Cardiovascular;  Laterality:  N/A;  . CARDIAC CATHETERIZATION N/A 05/20/2016   Procedure: Coronary Stent Intervention;  Surgeon: Sherren Mocha, MD;  Location: [facility] CV LAB;  Service: Cardiovascular;  Laterality: N/A;  . CARDIAC DEFIBRILLATOR PLACEMENT  05/2008   By Dr Blanch Media, [employer] CANNOT HAVE MRI's  . CAROTID ANGIOGRAM N/A 07/31/2011   Procedure: CAROTID ANGIOGRAM;  Surgeon: Lorretta Harp, MD;  Location: [facility] CATH LAB;  Service: Cardiovascular;  Laterality: N/A;  carotid angiogram and possible Lt SCA PTA  . COLONOSCOPY W/ POLYPECTOMY  12/2013  . CORONARY ANGIOPLASTY    . ENDARTERECTOMY Left 11/08/2014   Procedure: LEFT CAROTID ENDARTERECTOMY WITH HEMASHIELD PATCH ANGIOPLASTY;  Surgeon: Elam Dutch, MD;  Location: [facility];  Service: Vascular;  Laterality: Left;  . ESOPHAGOGASTRODUODENOSCOPY N/A 01/20/2014   Procedure: ESOPHAGOGASTRODUODENOSCOPY (EGD);  Surgeon: Jerene Bears, MD;  Location[facility] ENDOSCOPY;  Service: Endoscopy;  Laterality: N/A;  . FEMORAL-POPLITEAL BYPASS GRAFT Right 10/12/2013   Procedure:   Femoral-Peroneal trunk  bypass with nonreversed greater saphenous vein graft;  Surgeon: Juanda Crumble  Antony Blackbird, MD;  Location: [facility];  Service: Vascular;  Laterality: Right;  . GIVENS CAPSULE STUDY N/A 01/20/2014   Procedure: GIVENS CAPSULE STUDY;  Surgeon: Jerene Bears, MD;  Location: [facility];  Service: Gastroenterology;  Laterality: N/A;  . ICD GENERATOR CHANGEOUT N/A 03/04/2017   Procedure: ICD Generator Changeout;  Surgeon: [name], MD;  Location: [facility] CV LAB;  Service: Cardiovascular;  Laterality: N/A;  . INSERT / REPLACE / REMOVE PACEMAKER    . INTRAOPERATIVE ARTERIOGRAM Right 10/12/2013   Procedure: INTRA OPERATIVE ARTERIOGRAM;  Surgeon: Elam Dutch, MD;  Location: [facility];  Service: Vascular;  Laterality: Right;  . LEFT HEART CATH AND CORONARY ANGIOGRAPHY N/A 10/24/2019   Procedure: LEFT HEART CATH AND CORONARY ANGIOGRAPHY;  Surgeon: Belva Crome, MD;  Location: [facility] CV LAB;   Service: Cardiovascular;  Laterality: N/A;  . ORIF ELBOW FRACTURE  08/16/2011   Procedure: OPEN REDUCTION INTERNAL FIXATION (ORIF) ELBOW/OLECRANON FRACTURE;  Surgeon: Schuyler Amor, MD;  Location: [facility];  Service: Orthopedics;  Laterality: Left;  . RENAL ANGIOGRAM N/A 08/12/2013   Procedure: RENAL ANGIOGRAM;  Surgeon: Elam Dutch, MD;  Location: [facility] CATH LAB;  Service: Cardiovascular;  Laterality: N/A;  . SUBCLAVIAN STENT PLACEMENT Left 07/31/2011   7x18 Genesis, balloon, with reduction of 90% ostial left subclavian artery stenosis to 0% with residual excellent flow  . TONSILLECTOMY    . TUBAL LIGATION       OB History   No obstetric history on file.     Family History  Problem Relation Age of Onset  . CAD Father   . Heart disease Father   . Hyperlipidemia Father   . Heart disease Mother   . Deep vein thrombosis Son   . Hyperlipidemia Other   . Colon cancer Maternal Grandmother   . Cancer Sister        ovarian  . Diabetes Sister   . Heart disease Sister   . Anesthesia problems Neg Hx   . Hypotension Neg Hx   . Malignant hyperthermia Neg Hx   . Pseudochol deficiency Neg Hx     Social History   Tobacco Use  . Smoking status: Former Smoker    Packs/day: 0.50    Years: 62.00    Pack years: 31.00    Types: Cigarettes    Quit date: 07/19/2017    Years since quitting: 2.4  . Smokeless tobacco: Never Used  Vaping Use  . Vaping Use: Never used  Substance Use Topics  . Alcohol use: No    Alcohol/week: 0.0 standard drinks  . Drug use: No    Home Medications Prior to Admission medications   Medication Sig Start Date End Date Taking? Authorizing Provider  albuterol (PROVENTIL) (2.5 MG/3ML) 0.083% nebulizer solution USE 1 VIAL IN NEBULIZER EVERY 4 HOURS AS NEEDED FOR WHEEZING FOR SHORTNESS OF BREATH Patient taking differently: Take 2.5 mg by nebulization every 4 (four) hours as needed for wheezing or shortness of breath.  05/18/19  Yes Laurey Morale, MD  allopurinol  (ZYLOPRIM) 300 MG tablet Take 1 tablet by mouth once daily Patient taking differently: Take 300 mg by mouth daily.  11/22/19  Yes Laurey Morale, MD  ALPRAZolam Duanne Moron) 1 MG tablet TAKE 1 TABLET BY MOUTH IN THE MORNING AND 1 & 1/2 (ONE & ONE-HALF) NIGHTLY AS NEEDED FOR ANXIETY Patient taking differently: Take 1-1.5 mg by mouth See admin instructions. Take 1 tablet (1mg ) by mouth daily in the morning and 1  and 1/2 tablet (1.5mg ) by mough every night as needed for anxiety. 11/22/19  Yes Laurey Morale, MD  amiodarone (PACERONE) 200 MG tablet Take 1 tablet (200 mg total) by mouth 2 (two) times daily. 10/26/19  Yes Shirley Friar, PA-C  Ascorbic Acid (VITAMIN C PO) Take 1 tablet by mouth in the morning and at bedtime.   Yes [provider]  aspirin 81 MG chewable tablet Chew 1 tablet (81 mg total) by mouth daily. 05/23/16  Yes Reyne Dumas, MD  azelastine (ASTELIN) 0.1 % nasal spray USE 2 SPRAY(S) IN EACH NOSTRIL TWICE DAILY AS DIRECTED Patient taking differently: Place 2 sprays into both nostrils 2 (two) times daily.  08/08/19  Yes Laurey Morale, MD  Biotin 5000 MCG CAPS Take 1 capsule by mouth daily.   Yes [provider]  bisoprolol (ZEBETA) 5 MG tablet Take 1 tablet (5 mg total) by mouth daily. 12/09/19  Yes Eugenie Filler, MD  Cholecalciferol (VITAMIN D) 50 MCG (2000 UT) CAPS Take 4,000 Units by mouth daily.    Yes [provider]  clopidogrel (PLAVIX) 75 MG tablet Take 1 tablet by mouth once daily 06/30/19  Yes Laurey Morale, MD  Cyanocobalamin 5000 MCG TBDP Take 5,000 mcg by mouth daily.    Yes [provider]  DULoxetine (CYMBALTA) 60 MG capsule Take 1 capsule (60 mg total) by mouth 2 (two) times daily. 01/05/19  Yes Laurey Morale, MD  loratadine (CLARITIN) 10 MG tablet Take 10 mg by mouth daily.   Yes [provider]  nitroGLYCERIN (NITROSTAT) 0.4 MG SL tablet Place 1 tablet (0.4 mg total) under the tongue every 5 (five) minutes as needed for  chest pain. X 3 doses Patient taking differently: Place 0.4 mg under the tongue every 5 (five) minutes x 3 doses as needed for chest pain.  11/02/19  Yes Shirley Friar, PA-C  PROAIR HFA 108 (90 Base) MCG/ACT inhaler INHALE 2 PUFFS BY MOUTH EVERY 4 HOURS AS NEEDED FOR WHEEZING AND FOR SHORTNESS OF BREATH Patient taking differently: Inhale 2 puffs into the lungs every 4 (four) hours as needed for wheezing or shortness of breath.  12/02/19  Yes Laurey Morale, MD  spironolactone (ALDACTONE) 25 MG tablet Take 0.5 tablets (12.5 mg total) by mouth daily. 12/09/19  Yes Eugenie Filler, MD  torsemide (DEMADEX) 20 MG tablet Take 4 tablets (80 mg total) by mouth 2 (two) times daily. 12/21/19  Yes Laurey Morale, MD  [place] USE   TO CHECK GLUCOSE ONCE DAILY Patient taking differently: 1 each by Other route daily.  07/27/18   Laurey Morale, MD  ACCU-CHEK SMARTVIEW test strip USE  STRIP TO CHECK GLUCOSE ONCE DAILY Patient taking differently: 1 each by Other route daily.  06/15/19   Laurey Morale, MD  guaiFENesin (MUCINEX) 600 MG 12 hr tablet Take 2 tablets (1,200 mg total) by mouth 2 (two) times daily as needed. Patient not taking: Reported on 01/12/2020 12/09/19   Eugenie Filler, MD  Magnesium Oxide 400 MG CAPS Take 1 capsule (400 mg total) by mouth daily. Patient not taking: Reported on 01/12/2020 07/15/19   Croitoru, Dani Gobble, MD  pantoprazole (PROTONIX) 40 MG tablet Take 1 tablet (40 mg total) by mouth daily at 6 (six) AM. Patient not taking: Reported on 12/16/2019 12/10/19   Eugenie Filler, MD    Allergies    Potassium-containing compounds, Neomycin-polymyxin b gu, Other, Azithromycin, Codeine, Darvon, Erythromycin, Meloxicam, Norco [  hydrocodone-acetaminophen], Penicillins, Propoxyphene n-acetaminophen, Rofecoxib, Rosuvastatin, Statins, and Sulfa antibiotics  Review of Systems   Review of Systems  Cardiovascular: Positive for chest pain (resolved).  All other systems  reviewed and are negative.   Physical Exam Updated Vital Signs BP (!) 169/63 (BP Location: Right Arm)   Pulse 61   Temp 98.5 F (36.9 C) (Oral)   Resp 14   SpO2 95%   Physical Exam Vitals and nursing note reviewed.  Constitutional:      General: She is not in acute distress.    Appearance: She is well-developed.     Comments: Appears nontoxic  HENT:     Head: Normocephalic and atraumatic.  Eyes:     Conjunctiva/sclera: Conjunctivae normal.     Pupils: Pupils are equal, round, and reactive to light.  Cardiovascular:     Rate and Rhythm: Normal rate and regular rhythm.     Pulses: Normal pulses.  Pulmonary:     Effort: Pulmonary effort is normal. No respiratory distress.     Breath sounds: Rales present. No wheezing.     Comments: Rales in bilateral lower lobes.  Speaking in full sentences.  On room air, SPO2 90-92.  On 2 L, SPO2 in the mid to upper 90s Abdominal:     General: There is no distension.     Palpations: Abdomen is soft. There is no mass.     Tenderness: There is no abdominal tenderness. There is no guarding or rebound.  Musculoskeletal:        General: Normal range of motion.     Cervical back: Normal range of motion and neck supple.     Right lower leg: Edema present.     Left lower leg: Edema present.     Comments: Minimal bilateral lower leg swelling  Skin:    General: Skin is warm and dry.     Capillary Refill: Capillary refill takes less than 2 seconds.  Neurological:     Mental Status: She is alert and oriented to person, place, and time.     ED Results / Procedures / Treatments   Labs (all labs ordered are listed, but only abnormal results are displayed) Labs Reviewed  BASIC METABOLIC PANEL - Abnormal; Notable for the following components:      Result Value   Potassium 3.1 (*)    Chloride 96 (*)    CO2 33 (*)    Glucose, Bld 162 (*)    Creatinine, Ser 1.51 (*)    GFR calc non Af Amer 32 (*)    GFR calc Af Amer 37 (*)    All other  components within normal limits  CBC - Abnormal; Notable for the following components:   HCT 46.5 (*)    MCV 102.2 (*)    All other components within normal limits  TROPONIN I (HIGH SENSITIVITY) - Abnormal; Notable for the following components:   Troponin I (High Sensitivity) 24 (*)    All other components within normal limits  TROPONIN I (HIGH SENSITIVITY) - Abnormal; Notable for the following components:   Troponin I (High Sensitivity) 22 (*)    All other components within normal limits  SARS CORONAVIRUS 2 BY RT PCR [facility] ORDER, [place] LAB)    EKG EKG Interpretation  Date/Time:  Thursday January 12 2020 05:01:25 EDT Ventricular Rate:  60 PR Interval:  148 QRS Duration: 194 QT Interval:  526 QTC Calculation: 526 R Axis:   10 Text Interpretation: AV dual-paced rhythm Abnormal  ECG When compared with ECG of 12/05/2019, No significant change was found Confirmed by Delora Fuel ([id number]) on 01/12/2020 6:10:59 AM   Radiology DG Chest 2 View  Result Date: 01/12/2020 CLINICAL DATA:  Chest pain. EXAM: CHEST - 2 VIEW COMPARISON:  12/05/2019. FINDINGS: AICD in stable position. Again noted cardiomegaly with pulmonary venous congestion bilateral interstitial prominence and small bilateral pleural effusions. Findings consistent CHF. Similar findings noted on prior exam. Calcified pulmonary nodules noted consistent prior granulomas disease. No pneumothorax. IMPRESSION: AICD in stable position. Cardiomegaly with pulmonary venous congestion, bilateral interstitial prominence, and small bilateral pleural effusions again noted. Findings again consistent with CHF. Similar findings noted on prior exam. Electronically Signed   By: Marcello Moores  Register   On: 01/12/2020 05:20   DG Wrist Complete Left  Result Date: 01/12/2020 CLINICAL DATA:  Persistent left wrist pain after fall 1 month ago EXAM: LEFT WRIST - COMPLETE 3+ VIEW COMPARISON:  12/26/2019 FINDINGS: No definite acute fracture  is identified. Possible subtle horizontal lucency at the distal ulna could reflect a subacute nondisplaced fracture. No definite fracture line is seen in at the radial styloid. Mildly widened scapholunate interval of 3 mm. Similar degree of osteoarthritis, most pronounced at the first [facility] and triscaphe joints. Bones are demineralized. Soft tissue swelling persists. IMPRESSION: 1. Possible subtle subacute nondisplaced fracture at the distal ulna. Correlate with point tenderness. 2. No definite acute or healing distal radial fracture is identified. 3. Mildly widened scapholunate interval of 3 mm suggesting underlying ligamentous injury. Electronically Signed   By: Davina Poke D.O.   On: 01/12/2020 08:47    Procedures Procedures (including critical care time)  Medications Ordered in ED Medications  sodium chloride flush (NS) 0.9 % injection 3 mL (has no administration in time range)    ED Course  I have reviewed the triage vital signs and the nursing notes.  Pertinent labs & imaging results that were available during my care of the patient were reviewed by me and considered in my medical decision making (see chart for details).    MDM Rules/Calculators/A&P                          Patient presenting after feeling her AICD give 2 shocks today.  She has been chest pain-free since.  On exam, patient appears nontoxic.  She does have some evidence of fluid overload and peripheral edema as well as rales in bilateral lower lobes.  She does require oxygen to remain in the mid to upper 90s, however does report needing oxygen at night and when she is laying, as she is in the bed.  Labs obtained from triage interpreted by me.  Mildly elevated troponin at 22, however this appears to be consistent patient's baseline.  Electrolytes at patient's baseline including kidney function at 1.5.  EKG unchanged from previous.  Chest x-ray viewed interpreted by me, shows bilateral small pleural effusions, per radiology  similar to previous.  Will interrogate pacemaker and reassess.  Case discussed with attending, Dr. Sedonia Small evaluated the patient.  Pacemaker interrogation shows 7 shocks since June 24 due to V. fib episodes.  She had 3 shocks this morning, most recent at 4:32 AM.  She also had several runs of NSVT this morning.  Additionally, patient has an optima device which shows worsening fluid status/CHF. Will consult with cardiology.   Discussed with cardiology, pt to be admitted  Final Clinical Impression(s) / ED Diagnoses Final diagnoses:  AICD discharge  Rx / DC Orders ED Discharge Orders    None       Franchot Heidelberg, PA-C 01/12/20 0957    Maudie Flakes, MD 01/12/20 [phone]

## 2020-01-12 NOTE — Progress Notes (Signed)
Orthopedic Tech Progress Note Patient Details:  Sydney Phillips 06-18-1935 [id number]  Ortho Devices Type of Ortho Device: Volar splint Ortho Device/Splint Location: LUE Ortho Device/Splint Interventions: Ordered, Application   Post Interventions Patient Tolerated: Well Instructions Provided: Care of device, Poper ambulation with device   Jonathin Heinicke 01/12/2020, 6:33 PM

## 2020-01-12 NOTE — ED Notes (Signed)
Lunch Tray Ordered @ 1008. 

## 2020-01-12 NOTE — H&P (Addendum)
Cardiology Admission History and Physical:   Patient ID: Sydney Phillips MRN: [id number]; DOB: 1935/09/25   Admission date: 01/12/2020  Primary Care Provider: Laurey Morale, MD [facility] Cardiologist: [name], MD  [facility] Electrophysiologist:  [name], MD   Chief Complaint:  SOB, ICD discharge  Patient Profile:   Sydney Phillips is a 84 y.o. 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  Her EF was 20% in 2019, however after CRT-D therapy, EF has improved to 50% x 2013, since then, EF decreased again.  Patient underwent cardiac catheterization for NSTEMI in November 2017.  She underwent stenting of RCA and left circumflex artery at the time, she also had moderate ostial LAD lesion.  Previous echocardiogram in February 2019 showed EF 35 to 40%, mild LVH.  Last echocardiogram obtained on 08/24/2019 showed EF down to 22 from 25% again.  Previously, patient was seen by EP service for ICD shock in January 2021 due to noncompliance with her potassium and magnesium supplementation.  She was readmitted with V. fib and ICD shock in May 2021 after self discontinuing her amiodarone therapy.  Hospital course was complicated by acute on chronic combined CHF requiring IV diuretic.  Due to concern of ischemic etiology behind V. fib, patient underwent cardiac catheterization on 10/24/2019 which showed stable 60% ostial LAD lesion, 30% ostial left  main lesion, 50% in-stent restenosis in the proximal to mid left circumflex artery, 30 to 40% ostial left circumflex artery stenosis, 50% in-stent restenosis in the RCA stent, LVEDP 31 mmHg.  Patient was eventually discharged on 80 mg twice daily of torsemide and 12.5 mg daily of spironolactone.  However due to worsening renal function and hyperkalemia, spironolactone was later discontinued.  Unfortunately patient was readmitted to the hospital in June 2021 with recurrent heart failure.  She was diuresed more than 6 L and was eventually discharged with a dry weight of 179 pounds.  Since discharge, she says her weight has been between 179 to 181 pounds.  Although she was discharged on 60 mg twice daily of torsemide and 12.5 mg daily of spironolactone, torsemide dosage was eventually increased back up to 80 mg twice daily.  On recent remote transmission, she has had multiple device discharges.  We had a hard time reaching out to the patient.  She does admit to have shortness of breath with exertion however she sleep upright and has not noticed any PND recently.  She got off the bed around 3 AM this morning try to find her dog, upon returning to the bed, she felt like to hammer dropped on her chest.  She suspected her device has worn off therefore sought medical attention at [facility], ED.  On arrival, potassium was 3.1.  Creatinine was 1.51 which was her baseline.  Weight is not available at this time.  Chest x-ray showed pulmonary venous congestion and bilateral interstitial prominence consistent with CHF.  Device interrogations show that she has had 3 device discharges this morning however a total of  7 discharge from 7/24.  As a signout, patient was recently seen by her PCP on 7/7 after fall at home.  Wrist x-ray at the time showed a nondisplaced fracture at the base of the radial styloid.  She was referred to orthopedic surgery however has had a hard time reaching out to the orthopedic surgeon.  Repeat x-ray of  the left wrist continue to show possible subtle subacute nondisplaced fracture in the right distal ulna.   Past Medical History:  Diagnosis Date  . Anxiety   . Arthritis   . Asthma   . Automatic implantable cardioverter-defibrillator in situ   . Bronchitis   . CAD (coronary artery disease)    a. s/p DES to LCx/RCA 05/2016, ostial LAD disease.  . Cardiomyopathy ([place])   . Chronic back pain   . Chronic combined systolic and diastolic CHF (congestive heart failure) ([place])   . Chronic constipation   . Chronic pain   . CKD (chronic kidney disease), stage III   . Colon polyps 2003.  2015.   HP polyps 2003.  adnomas 2015.  required referal to [facility] for colonoscopic resection of flat polyps.   Marland Kitchen COPD (chronic obstructive pulmonary disease) ([place])   . Dementia ([place])   . Depression   . Depression with anxiety    takes Cymbalta daily  . Diabetes mellitus ([place])   . Dyspnea   . Early cataracts, bilateral   . Fibromyalgia   . GERD (gastroesophageal reflux disease)    was on meds but was taken off;now watches what she eats  . Hemorrhoids   . History of kidney stones   . Hx of colonic polyps   . Hyperlipemia    takes Crestor daily  . Hypertension    takes Amlodipine and Metoprolol daily  . Insomnia   . LBBB (left bundle branch block)    Stress test 09/03/2010, EF 55  . Myocardial infarction ([place])   . PAD (peripheral artery disease) ([facility])    Carotid, subclavian, and lower extremity beds, currently not symptomatic  . Presence of combination internal cardiac defibrillator (ICD) and pacemaker   . Presence of permanent cardiac pacemaker   . Pulmonary hypertension ([place])   . S/P angioplasty with stent, lt. subclavian 07/31/11 08/01/2011  . Subclavian arterial stenosis, lt, with PTA/STENT 07/31/11 08/01/2011  . Syncope 07/28/2011   EF - 50-55, moderate concentric hypertrophy in left ventricle  . Urinary incontinence   . Vertigo    takes Meclizine prn    Past Surgical History:  Procedure  Laterality Date  . ABDOMINAL AORTAGRAM N/A 08/12/2013   Procedure: ABDOMINAL Maxcine Ham;  Surgeon: Elam Dutch, MD;  Location: [facility] CATH LAB;  Service: Cardiovascular;  Laterality: N/A;  . ABDOMINAL HYSTERECTOMY    . APPENDECTOMY    . BACK SURGERY  2012  . BIV ICD GENERTAOR CHANGE OUT Left 02/20/2012   Procedure: BIV ICD GENERTAOR CHANGE OUT;  Surgeon: [name], MD;  Location: [facility] CATH LAB;  Service: Cardiovascular;  Laterality: Left;  . CARDIAC CATHETERIZATION  12/01/2007   By Dr. Melvern Banker, left heart cath,   . CARDIAC CATHETERIZATION N/A 05/20/2016   Procedure: Right/Left Heart Cath and Coronary Angiography;  Surgeon: Sherren Mocha, MD;  Location: [place] CV LAB;  Service: Cardiovascular;  Laterality: N/A;  . CARDIAC CATHETERIZATION N/A 05/20/2016   Procedure: Coronary Stent Intervention;  Surgeon: Sherren Mocha, MD;  Location: [place] CV LAB;  Service: Cardiovascular;  Laterality: N/A;  . CARDIAC DEFIBRILLATOR PLACEMENT  05/2008   By Dr  Soloman, [employer] CANNOT HAVE MRI's  . CAROTID ANGIOGRAM N/A 07/31/2011   Procedure: CAROTID ANGIOGRAM;  Surgeon: Lorretta Harp, MD;  Location: [facility] CATH LAB;  Service: Cardiovascular;  Laterality: N/A;  carotid angiogram and possible Lt SCA PTA  . COLONOSCOPY W/ POLYPECTOMY  12/2013  . CORONARY ANGIOPLASTY    . ENDARTERECTOMY Left 11/08/2014   Procedure: LEFT CAROTID ENDARTERECTOMY WITH HEMASHIELD PATCH ANGIOPLASTY;  Surgeon: Elam Dutch, MD;  Location: [place];  Service: Vascular;  Laterality: Left;  . ESOPHAGOGASTRODUODENOSCOPY N/A 01/20/2014   Procedure: ESOPHAGOGASTRODUODENOSCOPY (EGD);  Surgeon: Jerene Bears, MD;  Location: [facility] ENDOSCOPY;  Service: Endoscopy;  Laterality: N/A;  . FEMORAL-POPLITEAL BYPASS GRAFT Right 10/12/2013   Procedure:   Femoral-Peroneal trunk  bypass with nonreversed greater saphenous vein graft;  Surgeon: Elam Dutch, MD;  Location: [place];  Service: Vascular;  Laterality: Right;  . GIVENS CAPSULE STUDY N/A 01/20/2014    Procedure: GIVENS CAPSULE STUDY;  Surgeon: Jerene Bears, MD;  Location: [place];  Service: Gastroenterology;  Laterality: N/A;  . ICD GENERATOR CHANGEOUT N/A 03/04/2017   Procedure: ICD Generator Changeout;  Surgeon: [name], MD;  Location: [place] CV LAB;  Service: Cardiovascular;  Laterality: N/A;  . INSERT / REPLACE / REMOVE PACEMAKER    . INTRAOPERATIVE ARTERIOGRAM Right 10/12/2013   Procedure: INTRA OPERATIVE ARTERIOGRAM;  Surgeon: Elam Dutch, MD;  Location: [place];  Service: Vascular;  Laterality: Right;  . LEFT HEART CATH AND CORONARY ANGIOGRAPHY N/A 10/24/2019   Procedure: LEFT HEART CATH AND CORONARY ANGIOGRAPHY;  Surgeon: Belva Crome, MD;  Location: [place] CV LAB;  Service: Cardiovascular;  Laterality: N/A;  . ORIF ELBOW FRACTURE  08/16/2011   Procedure: OPEN REDUCTION INTERNAL FIXATION (ORIF) ELBOW/OLECRANON FRACTURE;  Surgeon: Schuyler Amor, MD;  Location: [place];  Service: Orthopedics;  Laterality: Left;  . RENAL ANGIOGRAM N/A 08/12/2013   Procedure: RENAL ANGIOGRAM;  Surgeon: Elam Dutch, MD;  Location: [facility] CATH LAB;  Service: Cardiovascular;  Laterality: N/A;  . SUBCLAVIAN STENT PLACEMENT Left 07/31/2011   7x18 Genesis, balloon, with reduction of 90% ostial left subclavian artery stenosis to 0% with residual excellent flow  . TONSILLECTOMY    . TUBAL LIGATION       Medications Prior to Admission: Prior to Admission medications   Medication Sig Start Date End Date Taking? Authorizing Provider  albuterol (PROVENTIL) (2.5 MG/3ML) 0.083% nebulizer solution USE 1 VIAL IN NEBULIZER EVERY 4 HOURS AS NEEDED FOR WHEEZING FOR SHORTNESS OF BREATH Patient taking differently: Take 2.5 mg by nebulization every 4 (four) hours as needed for wheezing or shortness of breath.  05/18/19  Yes Laurey Morale, MD  allopurinol (ZYLOPRIM) 300 MG tablet Take 1 tablet by mouth once daily Patient taking differently: Take 300 mg by mouth daily.  11/22/19  Yes Laurey Morale,  MD  ALPRAZolam Duanne Moron) 1 MG tablet TAKE 1 TABLET BY MOUTH IN THE MORNING AND 1 & 1/2 (ONE & ONE-HALF) NIGHTLY AS NEEDED FOR ANXIETY Patient taking differently: Take 1-1.5 mg by mouth See admin instructions. Take 1 tablet (1mg ) by mouth daily in the morning and 1 and 1/2 tablet (1.5mg ) by mough every night as needed for anxiety. 11/22/19  Yes Laurey Morale, MD  amiodarone (PACERONE) 200 MG tablet Take 1 tablet (200 mg total) by mouth 2 (two) times daily. 10/26/19  Yes Shirley Friar, PA-C  Ascorbic Acid (VITAMIN C PO) Take 1 tablet by mouth in the morning and at bedtime.  Yes [provider]  aspirin 81 MG chewable tablet Chew 1 tablet (81 mg total) by mouth daily. 05/23/16  Yes Reyne Dumas, MD  azelastine (ASTELIN) 0.1 % nasal spray USE 2 SPRAY(S) IN EACH NOSTRIL TWICE DAILY AS DIRECTED Patient taking differently: Place 2 sprays into both nostrils 2 (two) times daily.  08/08/19  Yes Laurey Morale, MD  Biotin 5000 MCG CAPS Take 1 capsule by mouth daily.   Yes [provider]  bisoprolol (ZEBETA) 5 MG tablet Take 1 tablet (5 mg total) by mouth daily. 12/09/19  Yes Eugenie Filler, MD  Cholecalciferol (VITAMIN D) 50 MCG (2000 UT) CAPS Take 4,000 Units by mouth daily.    Yes [provider]  clopidogrel (PLAVIX) 75 MG tablet Take 1 tablet by mouth once daily 06/30/19  Yes Laurey Morale, MD  Cyanocobalamin 5000 MCG TBDP Take 5,000 mcg by mouth daily.    Yes [provider]  DULoxetine (CYMBALTA) 60 MG capsule Take 1 capsule (60 mg total) by mouth 2 (two) times daily. 01/05/19  Yes Laurey Morale, MD  loratadine (CLARITIN) 10 MG tablet Take 10 mg by mouth daily.   Yes [provider]  nitroGLYCERIN (NITROSTAT) 0.4 MG SL tablet Place 1 tablet (0.4 mg total) under the tongue every 5 (five) minutes as needed for chest pain. X 3 doses Patient taking differently: Place 0.4 mg under the tongue every 5 (five) minutes x 3 doses as needed for chest pain.   11/02/19  Yes Shirley Friar, PA-C  PROAIR HFA 108 (90 Base) MCG/ACT inhaler INHALE 2 PUFFS BY MOUTH EVERY 4 HOURS AS NEEDED FOR WHEEZING AND FOR SHORTNESS OF BREATH Patient taking differently: Inhale 2 puffs into the lungs every 4 (four) hours as needed for wheezing or shortness of breath.  12/02/19  Yes Laurey Morale, MD  spironolactone (ALDACTONE) 25 MG tablet Take 0.5 tablets (12.5 mg total) by mouth daily. 12/09/19  Yes Eugenie Filler, MD  torsemide (DEMADEX) 20 MG tablet Take 4 tablets (80 mg total) by mouth 2 (two) times daily. 12/21/19  Yes Laurey Morale, MD  Ida USE   TO CHECK GLUCOSE ONCE DAILY Patient taking differently: 1 each by Other route daily.  07/27/18   Laurey Morale, MD  ACCU-CHEK SMARTVIEW test strip USE  STRIP TO CHECK GLUCOSE ONCE DAILY Patient taking differently: 1 each by Other route daily.  06/15/19   Laurey Morale, MD  guaiFENesin (MUCINEX) 600 MG 12 hr tablet Take 2 tablets (1,200 mg total) by mouth 2 (two) times daily as needed. Patient not taking: Reported on 01/12/2020 12/09/19   Eugenie Filler, MD  Magnesium Oxide 400 MG CAPS Take 1 capsule (400 mg total) by mouth daily. Patient not taking: Reported on 01/12/2020 07/15/19   Maryelizabeth Eberle, Dani Gobble, MD  pantoprazole (PROTONIX) 40 MG tablet Take 1 tablet (40 mg total) by mouth daily at 6 (six) AM. Patient not taking: Reported on 12/16/2019 12/10/19   Eugenie Filler, MD     Allergies:    Allergies  Allergen Reactions  . Potassium-Containing Compounds Other (See Comments)    Causes severe constipation  . Neomycin-Polymyxin B Gu     Swollen throat   . Other Other (See Comments)    "mycin" antibiotics cause sleeplessness and itching  . Azithromycin Rash  . Codeine Itching  . Darvon Itching  . Erythromycin Rash  . Meloxicam Other (See Comments)    Unknown    .  Norco [Hydrocodone-Acetaminophen] Itching  . Penicillins Rash and Other (See Comments)    Has patient had a PCN  reaction causing immediate rash, facial/tongue/throat swelling, SOB or lightheadedness with hypotension: unknown Has patient had a PCN reaction causing severe rash involving mucus membranes or skin necrosis: no Has patient had a PCN reaction that required hospitalization: unknown Has patient had a PCN reaction occurring within the last 10 years: no If all of the above answers are "NO", then may proceed with Cephalosporin use.   Marland Kitchen Propoxyphene N-Acetaminophen Itching  . Rofecoxib Other (See Comments)    Unknown    . Rosuvastatin Other (See Comments)    cramps  . Statins Itching and Other (See Comments)    Sleeplessness Sleeplessness  . Sulfa Antibiotics Rash    Social History:   Social History   Socioeconomic History  . Marital status: Widowed    Spouse name: Not on file  . Number of children: 3  . Years of education: 59  . Highest education level: Not on file  Occupational History  . Occupation: Retired  Tobacco Use  . Smoking status: Former Smoker    Packs/day: 0.50    Years: 62.00    Pack years: 31.00    Types: Cigarettes    Quit date: 07/19/2017    Years since quitting: 2.4  . Smokeless tobacco: Never Used  Vaping Use  . Vaping Use: Never used  Substance and Sexual Activity  . Alcohol use: No    Alcohol/week: 0.0 standard drinks  . Drug use: No  . Sexual activity: Never    Birth control/protection: Surgical  Other Topics Concern  . Not on file  Social History Narrative   Ok to share information with medical POA, Son Gabriel Carina   Right-handed   Caffeine: Pepsi   Social Determinants of Health   Financial Resource Strain: Low Risk   . Difficulty of Paying Living Expenses: Not very hard  Food Insecurity:   . Worried About Charity fundraiser in the Last Year:   . Arboriculturist in the Last Year:   Transportation Needs: No Transportation Needs  . Lack of Transportation (Medical): No  . Lack of Transportation (Non-Medical): No  Physical Activity:   . Days  of Exercise per Week:   . Minutes of Exercise per Session:   Stress:   . Feeling of Stress :   Social Connections: Moderately Isolated  . Frequency of Communication with Friends and Family: More than three times a week  . Frequency of Social Gatherings with Friends and Family: More than three times a week  . Attends Religious Services: Never  . Active Member of Clubs or Organizations: Yes  . Attends Archivist Meetings: Never  . Marital Status: Widowed  Intimate Partner Violence:   . Fear of Current or Ex-Partner:   . Emotionally Abused:   Marland Kitchen Physically Abused:   . Sexually Abused:     Family History:   The patient's family history includes CAD in her father; Cancer in her sister; Colon cancer in her maternal grandmother; Deep vein thrombosis in her son; Diabetes in her sister; Heart disease in her father, mother, and sister; Hyperlipidemia in her father and another family member. There is no history of Anesthesia problems, Hypotension, Malignant hyperthermia, or Pseudochol deficiency.    ROS:  Please see the history of present illness.  All other ROS reviewed and negative.     Physical Exam/Data:   Vitals:   01/12/20 0506 01/12/20  0527  BP: (!) 169/63   Pulse: 61   Resp: 14   Temp: 98.5 F (36.9 C)   TempSrc: Oral   SpO2: (!) 88% 95%   No intake or output data in the 24 hours ending 01/12/20 0925 Last 3 Weights 12/21/2019 12/12/2019 12/11/2019  Weight (lbs) 177 lb 178 lb 9.6 oz 177 lb 9.6 oz  Weight (kg) 80.287 kg 81.012 kg 80.559 kg     There is no height or weight on file to calculate BMI.  General:  Well nourished, well developed, in no acute distress HEENT: normal Lymph: no adenopathy Neck: no JVD Endocrine:  No thryomegaly Vascular: No carotid bruits; FA pulses 2+ bilaterally without bruits  Cardiac:  normal S1, S2; RRR; no murmur  Lungs:  Bibasilar crackles Abd: soft, nontender, no hepatomegaly  Ext: no edema Musculoskeletal:  No deformities, BUE and  BLE strength normal and equal Skin: warm and dry  Neuro:  CNs 2-12 intact, no focal abnormalities noted Psych:  Normal affect    EKG:  The ECG that was done and was personally reviewed and demonstrates paced rhythm  Relevant CV Studies:  Cath 10/24/2019  Moderate three-vessel coronary disease.  30% ostial left main.  60% ostial LAD (unchanged from 2017) with moderate mid LAD plaque and calcification.  Patent proximal to mid circumflex with diffuse 50% in-stent restenosis.  Ostial circumflex contains 30 to 40% narrowing.  RCA contains up to 50% diffuse in-stent restenosis.  Proximal to the stented segment is an eccentric 70% region of narrowing  Elevated LVEDP, 31 mmHg suggesting decompensated acute on chronic combined systolic heart failure.  Catheterization from right radial was compromised by difficulty with catheter advancement, most likely due to selection of the radial recurrent route to the subclavian artery requiring that catheter size be downgraded to 4 [place].  Recommendations:   No high-grade thrombotic appearing lesions noted.  Three-vessel disease as noted above.  Coronary angiography/PCI attempt in the future should not be via the right radial approach.  Guideline directed therapy for acute on chronic combined systolic and diastolic heart failure.  Consider advanced heart failure team consult.   Laboratory Data:  High Sensitivity Troponin:   Recent Labs  Lab 01/12/20 0501 01/12/20 0648  TROPONINIHS 24* 22*      Chemistry Recent Labs  Lab 01/12/20 0501  NA 142  K 3.1*  CL 96*  CO2 33*  GLUCOSE 162*  BUN 23  CREATININE 1.51*  CALCIUM 9.4  GFRNONAA 32*  GFRAA 37*  ANIONGAP 13    No results for input(s): PROT, ALBUMIN, AST, ALT, ALKPHOS, BILITOT in the last 168 hours. Hematology Recent Labs  Lab 01/12/20 0501  WBC 8.4  RBC 4.55  HGB 14.2  HCT 46.5*  MCV 102.2*  MCH 31.2  MCHC 30.5  RDW 15.2  PLT 247   BNPNo results for input(s):  BNP, PROBNP in the last 168 hours.  DDimer No results for input(s): DDIMER in the last 168 hours.   Radiology/Studies:  DG Chest 2 View  Result Date: 01/12/2020 CLINICAL DATA:  Chest pain. EXAM: CHEST - 2 VIEW COMPARISON:  12/05/2019. FINDINGS: AICD in stable position. Again noted cardiomegaly with pulmonary venous congestion bilateral interstitial prominence and small bilateral pleural effusions. Findings consistent CHF. Similar findings noted on prior exam. Calcified pulmonary nodules noted consistent prior granulomas disease. No pneumothorax. IMPRESSION: AICD in stable position. Cardiomegaly with pulmonary venous congestion, bilateral interstitial prominence, and small bilateral pleural effusions again noted. Findings again consistent with CHF. Similar findings  noted on prior exam. Electronically Signed   By: Marcello Moores  Register   On: 01/12/2020 05:20   DG Wrist Complete Left  Result Date: 01/12/2020 CLINICAL DATA:  Persistent left wrist pain after fall 1 month ago EXAM: LEFT WRIST - COMPLETE 3+ VIEW COMPARISON:  12/26/2019 FINDINGS: No definite acute fracture is identified. Possible subtle horizontal lucency at the distal ulna could reflect a subacute nondisplaced fracture. No definite fracture line is seen in at the radial styloid. Mildly widened scapholunate interval of 3 mm. Similar degree of osteoarthritis, most pronounced at the first [facility] and triscaphe joints. Bones are demineralized. Soft tissue swelling persists. IMPRESSION: 1. Possible subtle subacute nondisplaced fracture at the distal ulna. Correlate with point tenderness. 2. No definite acute or healing distal radial fracture is identified. 3. Mildly widened scapholunate interval of 3 mm suggesting underlying ligamentous injury. Electronically Signed   By: Davina Poke D.O.   On: 01/12/2020 08:47   {  New York Heart Association (NYHA) Functional Class NYHA Class III  Assessment and Plan:   1. Acute on chronic combined systolic and  diastolic heart failure  -Patient was previously admitted for heart failure in June 2021 and was discharged on 60 mg twice daily dosing on torsemide and 12.5 mg daily of potassium.  Since then, torsemide has increased to 80 mg twice daily dosing.  -Per patient, she tries to avoid food that is high in salt.  -On exam today, she has no lower extremity edema, however she has prominent bibasilar crackles consistent with CHF.  Will discuss with MD and start the patient on 120 mg twice daily dosing of IV Lasix.  She will need potassium supplement as well.  2. Recurrent ICD discharges: Patient had a 3 ICD discharges this morning in the setting of low potassium.  She had a total of 7 ICD discharges since May.  3. Hyperkalemia: K3.1, given the need for diuresis, will place her on 40 mEq 3 times daily of potassium for 1 day.  Will increase spironolactone to 25 mg daily.  4. Nondisplaced fracture in the left wrist: Initially diagnosed by Dr. Sarajane Jews as outpatient on 12/21/2019 and is seen on wrist x-ray at the time.  Per patient, she was referred to orthopedic service as outpatient, however she has not been able to get into see the orthopedic service since then.  She likely will need splint in the wrist.  We will call orthopedic consult in the hospital since she still has not been able to be seen by orthopedic as outpatient.  5. CAD: Last cardiac catheterization May 2021 showed stable anatomy  6. History of subclavian stenting: Occurred in 2013  7. Hypertension  8. Hyperlipidemia  9. DM2  10. CKD stage III   Severity of Illness: The appropriate patient status for this patient is OBSERVATION. Observation status is judged to be reasonable and necessary in order to provide the required intensity of service to ensure the patient's safety. The patient's presenting symptoms, physical exam findings, and initial radiographic and laboratory data in the context of their medical condition is felt to place them at  decreased risk for further clinical deterioration. Furthermore, it is anticipated that the patient will be medically stable for discharge from the hospital within 2 midnights of admission. The following factors support the patient status of observation.   " The patient's presenting symptoms include SOB. " The physical exam findings include volume overloaded with crackles on exam of lung. " The initial radiographic and laboratory data are ICD  interrogation shows 3 discharges.     For questions or updates, please contact [place] Please consult [email or web address] for contact info under     Signed, Almyra Deforest, [place]  01/12/2020 9:25 AM   I have seen and examined the patient along with Almyra Deforest, PA .  I have reviewed the chart, notes and new data.  I agree with PA/NP's note.  Key new complaints: she has no complaints of dyspnea at rest or angina. She felt the last 2 ICD shocks, but has often been completely unaware of multiple ICD discharges, including several in the last 2 weeks.  Key examination changes: JVD present, diminished breath sounds and a few rales in both bases, but no edema Key new findings / data:  ICD interrogated - multiple ICD discharges, all appropriate for sustained VT - no recent AFib - good BiV pacing (efficient>96%) with fairly narrow QRS and prominent +ve Rwave in V1-V2 -  thoracic impedance is low c/w volume overload Hypokalemia (may have precipitated the episodes of VT) No ischemia by biomarkers, ECG nondiagnostic for ischemia due to pacing. Recent cath without high risk lesions.  PLAN: - replace K - increase spironolactone - diuresis It's been a big challenge to identify a stable diuretic regimen. On at least 2 occasions she has had AKI and/or severe hypokalemia due to overdiuresis and she has had numerous episodes of HF exacerbation, despite the fact that she is enrolled in the remote CHF device clinic. Will try torsemide 80 mg BID and spironolactone 25 mg  daily.  [name], MD, [place] [phone] 01/12/2020, 1:27 PM

## 2020-01-12 NOTE — ED Triage Notes (Addendum)
Pt presents to ED POV. Pt c/o CP that began after her pacemaker/ICD fired. Pt reports that she was asleep when it fired. Pt states that it has fired two other times this month and she never felt it until tonight. Pt has [place] specific pacemaker  Pt O2 sat 87% on RA, resp e/u. Pt placed on 2L [place], O2 95%

## 2020-01-12 NOTE — Telephone Encounter (Signed)
Patient currently in the ED.

## 2020-01-12 NOTE — ED Notes (Signed)
Medtronic pacemaker interrogated °

## 2020-01-12 NOTE — Consult Note (Signed)
Reason for Consult:Wrist fx Referring Physician: M Croitoru  Sydney Phillips is an 84 y.o. female.  HPI: Sydney Phillips fell in her laundry room about 2 weeks ago and hurt her wrist. She went to see her PCP who diagnosed her with a radial styloid fx and she was referred to orthopedics. She was unable to keep that appointment however. She had some chest pain and tightness recently and came to the hospital 2/2 that and hand surgery was consulted for the wrist. X-rays today show a distal ulna fx. She is RHD.  Past Medical History:  Diagnosis Date  . Anxiety   . Arthritis   . Asthma   . Automatic implantable cardioverter-defibrillator in situ   . Bronchitis   . CAD (coronary artery disease)    a. s/p DES to LCx/RCA 05/2016, ostial LAD disease.  . Cardiomyopathy ([place])   . Chronic back pain   . Chronic combined systolic and diastolic CHF (congestive heart failure) ([place])   . Chronic constipation   . Chronic pain   . CKD (chronic kidney disease), stage III   . Colon polyps 2003.  2015.   HP polyps 2003.  adnomas 2015.  required referal to [facility] for colonoscopic resection of flat polyps.   Marland Kitchen COPD (chronic obstructive pulmonary disease) ([place])   . Dementia ([place])   . Depression   . Depression with anxiety    takes Cymbalta daily  . Diabetes mellitus ([place])   . Dyspnea   . Early cataracts, bilateral   . Fibromyalgia   . GERD (gastroesophageal reflux disease)    was on meds but was taken off;now watches what she eats  . Hemorrhoids   . History of kidney stones   . Hx of colonic polyps   . Hyperlipemia    takes Crestor daily  . Hypertension    takes Amlodipine and Metoprolol daily  . Insomnia   . LBBB (left bundle branch block)    Stress test 09/03/2010, EF 55  . Myocardial infarction ([place])   . PAD (peripheral artery disease) ([facility])    Carotid, subclavian, and lower extremity beds, currently not symptomatic  . Presence of combination internal cardiac defibrillator (ICD) and pacemaker   .  Presence of permanent cardiac pacemaker   . Pulmonary hypertension ([place])   . S/P angioplasty with stent, lt. subclavian 07/31/11 08/01/2011  . Subclavian arterial stenosis, lt, with PTA/STENT 07/31/11 08/01/2011  . Syncope 07/28/2011   EF - 50-55, moderate concentric hypertrophy in left ventricle  . Urinary incontinence   . Vertigo    takes Meclizine prn    Past Surgical History:  Procedure Laterality Date  . ABDOMINAL AORTAGRAM N/A 08/12/2013   Procedure: ABDOMINAL Maxcine Ham;  Surgeon: Elam Dutch, MD;  Location: [facility] CATH LAB;  Service: Cardiovascular;  Laterality: N/A;  . ABDOMINAL HYSTERECTOMY    . APPENDECTOMY    . BACK SURGERY  2012  . BIV ICD GENERTAOR CHANGE OUT Left 02/20/2012   Procedure: BIV ICD GENERTAOR CHANGE OUT;  Surgeon: [name], MD;  Location: [facility] CATH LAB;  Service: Cardiovascular;  Laterality: Left;  . CARDIAC CATHETERIZATION  12/01/2007   By Dr. Melvern Banker, left heart cath,   . CARDIAC CATHETERIZATION N/A 05/20/2016   Procedure: Right/Left Heart Cath and Coronary Angiography;  Surgeon: Sherren Mocha, MD;  Location: [place] CV LAB;  Service: Cardiovascular;  Laterality: N/A;  . CARDIAC CATHETERIZATION N/A 05/20/2016   Procedure: Coronary Stent Intervention;  Surgeon: Sherren Mocha, MD;  Location: [facility] CV LAB;  Service:  Cardiovascular;  Laterality: N/A;  . CARDIAC DEFIBRILLATOR PLACEMENT  05/2008   By Dr Blanch Media, [employer] CANNOT HAVE MRI's  . CAROTID ANGIOGRAM N/A 07/31/2011   Procedure: CAROTID ANGIOGRAM;  Surgeon: Lorretta Harp, MD;  [facility];  Service: Cardiovascular;  Laterality: N/A;  carotid angiogram and possible Lt SCA PTA  . COLONOSCOPY W/ POLYPECTOMY  12/2013  . CORONARY ANGIOPLASTY    . ENDARTERECTOMY Left 11/08/2014   Procedure: LEFT CAROTID ENDARTERECTOMY WITH HEMASHIELD PATCH ANGIOPLASTY;  Surgeon: Elam Dutch, MD;  Location: [place];  Service: Vascular;  Laterality: Left;  . ESOPHAGOGASTRODUODENOSCOPY N/A 01/20/2014    Procedure: ESOPHAGOGASTRODUODENOSCOPY (EGD);  Surgeon: Jerene Bears, MD;  Location[facility] ENDOSCOPY;  Service: Endoscopy;  Laterality: N/A;  . FEMORAL-POPLITEAL BYPASS GRAFT Right 10/12/2013   Procedure:   Femoral-Peroneal trunk  bypass with nonreversed greater saphenous vein graft;  Surgeon: Elam Dutch, MD;  Location: [place];  Service: Vascular;  Laterality: Right;  . GIVENS CAPSULE STUDY N/A 01/20/2014   Procedure: GIVENS CAPSULE STUDY;  Surgeon: Jerene Bears, MD;  Location: [place];  Service: Gastroenterology;  Laterality: N/A;  . ICD GENERATOR CHANGEOUT N/A 03/04/2017   Procedure: ICD Generator Changeout;  Surgeon: [name], MD;  Location: [place] CV LAB;  Service: Cardiovascular;  Laterality: N/A;  . INSERT / REPLACE / REMOVE PACEMAKER    . INTRAOPERATIVE ARTERIOGRAM Right 10/12/2013   Procedure: INTRA OPERATIVE ARTERIOGRAM;  Surgeon: Elam Dutch, MD;  Location: [place];  Service: Vascular;  Laterality: Right;  . LEFT HEART CATH AND CORONARY ANGIOGRAPHY N/A 10/24/2019   Procedure: LEFT HEART CATH AND CORONARY ANGIOGRAPHY;  Surgeon: Belva Crome, MD;  Location: [facility] CV LAB;  Service: Cardiovascular;  Laterality: N/A;  . ORIF ELBOW FRACTURE  08/16/2011   Procedure: OPEN REDUCTION INTERNAL FIXATION (ORIF) ELBOW/OLECRANON FRACTURE;  Surgeon: Schuyler Amor, MD;  Location: [place];  Service: Orthopedics;  Laterality: Left;  . RENAL ANGIOGRAM N/A 08/12/2013   Procedure: RENAL ANGIOGRAM;  Surgeon: Elam Dutch, MD;  Location: [facility] CATH LAB;  Service: Cardiovascular;  Laterality: N/A;  . SUBCLAVIAN STENT PLACEMENT Left 07/31/2011   7x18 Genesis, balloon, with reduction of 90% ostial left subclavian artery stenosis to 0% with residual excellent flow  . TONSILLECTOMY    . TUBAL LIGATION      Family History  Problem Relation Age of Onset  . CAD Father   . Heart disease Father   . Hyperlipidemia Father   . Heart disease Mother   . Deep vein thrombosis Son   .  Hyperlipidemia Other   . Colon cancer Maternal Grandmother   . Cancer Sister        ovarian  . Diabetes Sister   . Heart disease Sister   . Anesthesia problems Neg Hx   . Hypotension Neg Hx   . Malignant hyperthermia Neg Hx   . Pseudochol deficiency Neg Hx     Social History:  reports that she quit smoking about 2 years ago. Her smoking use included cigarettes. She has a 31.00 pack-year smoking history. She has never used smokeless tobacco. She reports that she does not drink alcohol and does not use drugs.  Allergies:  Allergies  Allergen Reactions  . Potassium-Containing Compounds Other (See Comments)    Causes severe constipation  . Neomycin-Polymyxin B Gu     Swollen throat   . Other Other (See Comments)    "mycin" antibiotics cause sleeplessness and itching  . Azithromycin Rash  .  Codeine Itching  . Darvon Itching  . Erythromycin Rash  . Meloxicam Other (See Comments)    Unknown    . Norco [Hydrocodone-Acetaminophen] Itching  . Penicillins Rash and Other (See Comments)    Has patient had a PCN reaction causing immediate rash, facial/tongue/throat swelling, SOB or lightheadedness with hypotension: unknown Has patient had a PCN reaction causing severe rash involving mucus membranes or skin necrosis: no Has patient had a PCN reaction that required hospitalization: unknown Has patient had a PCN reaction occurring within the last 10 years: no If all of the above answers are "NO", then may proceed with Cephalosporin use.   Marland Kitchen Propoxyphene N-Acetaminophen Itching  . Rofecoxib Other (See Comments)    Unknown    . Rosuvastatin Other (See Comments)    cramps  . Statins Itching and Other (See Comments)    Sleeplessness Sleeplessness  . Sulfa Antibiotics Rash    Medications: I have reviewed the patient's current medications.  Results for orders placed or performed during the hospital encounter of 01/12/20 (from the past 48 hour(s))  Basic metabolic panel     Status:  Abnormal   Collection Time: 01/12/20  5:01 AM  Result Value Ref Range   Sodium 142 135 - 145 mmol/L   Potassium 3.1 (L) 3.5 - 5.1 mmol/L   Chloride 96 (L) 98 - 111 mmol/L   CO2 33 (H) 22 - 32 mmol/L   Glucose, Bld 162 (H) 70 - 99 mg/dL    Comment: Glucose reference range applies only to samples taken after fasting for at least 8 hours.   BUN 23 8 - 23 mg/dL   Creatinine, Ser 1.51 (H) 0.44 - 1.00 mg/dL   Calcium 9.4 8.9 - 10.3 mg/dL   GFR calc non Af Amer 32 (L) >60 mL/min   GFR calc Af Amer 37 (L) >60 mL/min   Anion gap 13 5 - 15    Comment: Performed at [facility] [address] [place], [place] [address]  CBC     Status: Abnormal   Collection Time: 01/12/20  5:01 AM  Result Value Ref Range   WBC 8.4 4.0 - 10.5 K/uL   RBC 4.55 3.87 - 5.11 MIL/uL   Hemoglobin 14.2 12.0 - 15.0 g/dL   HCT 46.5 (H) 36 - 46 %   MCV 102.2 (H) 80.0 - 100.0 fL   MCH 31.2 26.0 - 34.0 pg   MCHC 30.5 30.0 - 36.0 g/dL   RDW 15.2 11.5 - 15.5 %   Platelets 247 150 - 400 K/uL   nRBC 0.0 0.0 - 0.2 %    Comment: Performed at [facility], [place] [address], [place], [place] [address]  Troponin I (High Sensitivity)     Status: Abnormal   Collection Time: 01/12/20  5:01 AM  Result Value Ref Range   Troponin I (High Sensitivity) 24 (H) <18 ng/L    Comment: (NOTE) Elevated high sensitivity troponin I (hsTnI) values and significant  changes across serial measurements may suggest ACS but many other  chronic and acute conditions are known to elevate hsTnI results.  Refer to the "Links" section for chest pain algorithms and additional  guidance. Performed at [facility], [place] [address], [place], [place] [address]   Troponin I (High Sensitivity)     Status: Abnormal   Collection Time: 01/12/20  6:48 AM  Result Value Ref Range   Troponin I (High Sensitivity) 22 (H) <18 ng/L    Comment: (NOTE)  Elevated high sensitivity troponin I (hsTnI) values and significant  changes across serial  measurements may suggest ACS but many other  chronic and acute conditions are known to elevate hsTnI results.  Refer to the "Links" section for chest pain algorithms and additional  guidance. Performed at [facility], [place] [address] [place], [place] [address]   SARS Coronavirus 2 by RT PCR (hospital order, performed in [facility] hospital lab) Nasopharyngeal Nasopharyngeal Swab     Status: None   Collection Time: 01/12/20  8:00 AM   Specimen: Nasopharyngeal Swab  Result Value Ref Range   SARS Coronavirus 2 NEGATIVE NEGATIVE    Comment: (NOTE) SARS-CoV-2 target nucleic acids are NOT DETECTED.  The SARS-CoV-2 RNA is generally detectable in upper and lower respiratory specimens during the acute phase of infection. The lowest concentration of SARS-CoV-2 viral copies this assay can detect is 250 copies / mL. A negative result does not preclude SARS-CoV-2 infection and should not be used as the sole basis for treatment or other patient management decisions.  A negative result may occur with improper specimen collection / handling, submission of specimen other than nasopharyngeal swab, presence of viral mutation(s) within the areas targeted by this assay, and inadequate number of viral copies (<250 copies / mL). A negative result must be combined with clinical observations, patient history, and epidemiological information.  Fact Sheet for Patients:   StrictlyIdeas.no  Fact Sheet for Healthcare Providers: BankingDealers.co.za  This test is not yet approved or  cleared by the [place] FDA and has been authorized for detection and/or diagnosis of SARS-CoV-2 by FDA under an Emergency Use Authorization (EUA).  This EUA will remain in effect (meaning this test can be used) for the duration of the COVID-19 declaration under Section 564(b)(1) of the Act, 21 U.S.C. section 360bbb-3(b)(1), unless the authorization is terminated  or revoked sooner.  Performed at [facility], [place] [address], [place], [place] [address]    *Note: Due to a large number of results and/or encounters for the requested time period, some results have not been displayed. A complete set of results can be found in Results Review.    DG Chest 2 View  Result Date: 01/12/2020 CLINICAL DATA:  Chest pain. EXAM: CHEST - 2 VIEW COMPARISON:  12/05/2019. FINDINGS: AICD in stable position. Again noted cardiomegaly with pulmonary venous congestion bilateral interstitial prominence and small bilateral pleural effusions. Findings consistent CHF. Similar findings noted on prior exam. Calcified pulmonary nodules noted consistent prior granulomas disease. No pneumothorax. IMPRESSION: AICD in stable position. Cardiomegaly with pulmonary venous congestion, bilateral interstitial prominence, and small bilateral pleural effusions again noted. Findings again consistent with CHF. Similar findings noted on prior exam. Electronically Signed   By: Marcello Moores  Register   On: 01/12/2020 05:20   DG Wrist Complete Left  Result Date: 01/12/2020 CLINICAL DATA:  Persistent left wrist pain after fall 1 month ago EXAM: LEFT WRIST - COMPLETE 3+ VIEW COMPARISON:  12/26/2019 FINDINGS: No definite acute fracture is identified. Possible subtle horizontal lucency at the distal ulna could reflect a subacute nondisplaced fracture. No definite fracture line is seen in at the radial styloid. Mildly widened scapholunate interval of 3 mm. Similar degree of osteoarthritis, most pronounced at the first [facility] and triscaphe joints. Bones are demineralized. Soft tissue swelling persists. IMPRESSION: 1. Possible subtle subacute nondisplaced fracture at the distal ulna. Correlate with point tenderness. 2. No definite acute or healing distal radial fracture is identified. 3. Mildly widened scapholunate interval of  3 mm suggesting underlying ligamentous injury. Electronically Signed   By: Davina Poke  D.O.   On: 01/12/2020 08:47    Review of Systems  HENT: Negative for ear discharge, ear pain, hearing loss and tinnitus.   Eyes: Negative for photophobia and pain.  Respiratory: Positive for chest tightness. Negative for cough and shortness of breath.   Cardiovascular: Positive for chest pain.  Gastrointestinal: Negative for abdominal pain, nausea and vomiting.  Genitourinary: Negative for dysuria, flank pain, frequency and urgency.  Musculoskeletal: Positive for arthralgias (Left wrist). Negative for back pain, myalgias and neck pain.  Neurological: Negative for dizziness and headaches.  Hematological: Does not bruise/bleed easily.  Psychiatric/Behavioral: The patient is not nervous/anxious.    Blood pressure (!) 169/63, pulse 61, temperature 98.5 F (36.9 C), temperature source Oral, resp. rate 14, SpO2 95 %. Physical Exam Constitutional:      General: She is not in acute distress.    Appearance: She is well-developed. She is not diaphoretic.  HENT:     Head: Normocephalic and atraumatic.  Eyes:     General: No scleral icterus.       Right eye: No discharge.        Left eye: No discharge.     Conjunctiva/sclera: Conjunctivae normal.  Cardiovascular:     Rate and Rhythm: Normal rate and regular rhythm.  Pulmonary:     Effort: Pulmonary effort is normal. No respiratory distress.  Musculoskeletal:     Cervical back: Normal range of motion.     Comments: Left shoulder, elbow, wrist, digits- no skin wounds, diffuse TTP wrist, no instability, no blocks to motion  Sens  Ax/R/M/U intact  Mot   Ax/ R/ PIN/ M/ AIN/ U intact  Rad 2+  Skin:    General: Skin is warm and dry.  Neurological:     Mental Status: She is alert.  Psychiatric:        Behavior: Behavior normal.     Assessment/Plan: Left wrist fx -- Will place in splint. She should f/u with Dr. Jeannie Fend in 2 weeks. NWB, ok to WBAT through elbow. 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

## 2020-01-13 DIAGNOSIS — N1832 Chronic kidney disease, stage 3b: Secondary | ICD-10-CM

## 2020-01-13 DIAGNOSIS — I739 Peripheral vascular disease, unspecified: Secondary | ICD-10-CM

## 2020-01-13 DIAGNOSIS — I25810 Atherosclerosis of coronary artery bypass graft(s) without angina pectoris: Secondary | ICD-10-CM

## 2020-01-13 DIAGNOSIS — I5043 Acute on chronic combined systolic (congestive) and diastolic (congestive) heart failure: Secondary | ICD-10-CM

## 2020-01-13 DIAGNOSIS — E039 Hypothyroidism, unspecified: Secondary | ICD-10-CM

## 2020-01-13 LAB — BASIC METABOLIC PANEL
Anion gap: 9 (ref 5–15)
BUN: 22 mg/dL (ref 8–23)
CO2: 33 mmol/L — ABNORMAL HIGH (ref 22–32)
Calcium: 9.5 mg/dL (ref 8.9–10.3)
Chloride: 100 mmol/L (ref 98–111)
Creatinine, Ser: 1.44 mg/dL — ABNORMAL HIGH (ref 0.44–1.00)
GFR calc Af Amer: 39 mL/min — ABNORMAL LOW (ref >60)
GFR calc non Af Amer: 34 mL/min — ABNORMAL LOW (ref >60)
Glucose, Bld: 184 mg/dL — ABNORMAL HIGH (ref 70–99)
Potassium: 4.2 mmol/L (ref 3.5–5.1)
Sodium: 142 mmol/L (ref 135–145)

## 2020-01-13 MED ORDER — AYR SALINE NASAL NA GEL
1.0000 "application " | NASAL | Status: DC | PRN
  Administered 2020-01-13: 1 via NASAL
  Filled 2020-01-13 (×2): qty 14.1

## 2020-01-13 MED ORDER — DULOXETINE HCL 60 MG PO CPEP
60.0000 mg | ORAL_CAPSULE | Freq: Two times a day (BID) | ORAL | Status: DC
  Administered 2020-01-13 – 2020-01-15 (×5): 60 mg via ORAL
  Filled 2020-01-13 (×5): qty 1

## 2020-01-13 MED ORDER — TORSEMIDE 20 MG PO TABS
80.0000 mg | ORAL_TABLET | Freq: Two times a day (BID) | ORAL | Status: DC
  Administered 2020-01-13 – 2020-01-14 (×3): 80 mg via ORAL
  Filled 2020-01-13 (×3): qty 4

## 2020-01-13 MED ORDER — POTASSIUM CHLORIDE CRYS ER 20 MEQ PO TBCR
20.0000 meq | EXTENDED_RELEASE_TABLET | Freq: Every day | ORAL | Status: DC
  Administered 2020-01-13 – 2020-01-15 (×3): 20 meq via ORAL
  Filled 2020-01-13 (×3): qty 1

## 2020-01-13 MED ORDER — ALPRAZOLAM 0.5 MG PO TABS
1.5000 mg | ORAL_TABLET | Freq: Every evening | ORAL | Status: DC | PRN
  Administered 2020-01-13 – 2020-01-14 (×2): 1.5 mg via ORAL
  Filled 2020-01-13 (×2): qty 3

## 2020-01-13 MED ORDER — ALPRAZOLAM 0.5 MG PO TABS
1.0000 mg | ORAL_TABLET | Freq: Every day | ORAL | Status: DC | PRN
  Administered 2020-01-13 – 2020-01-15 (×2): 1 mg via ORAL
  Filled 2020-01-13 (×2): qty 2

## 2020-01-13 MED ORDER — ALBUTEROL SULFATE (2.5 MG/3ML) 0.083% IN NEBU: 3.0000 mL | INHALATION_SOLUTION | RESPIRATORY_TRACT | Status: DC | PRN

## 2020-01-13 NOTE — Progress Notes (Signed)
Progress Note  Patient Name: Sydney Phillips Date of Encounter: 01/13/2020  [facility] Cardiologist: [name], MD   Subjective   Denies dyspnea. No arrhythmia overnight. K 4.1.  Inpatient Medications    Scheduled Meds: . amiodarone  200 mg Oral BID  . bisoprolol  5 mg Oral Daily  . clopidogrel  75 mg Oral Daily  . DULoxetine  60 mg Oral BID  . heparin  5,000 Units Subcutaneous Q8H  . potassium chloride  20 mEq Oral Daily  . sodium chloride flush  3 mL Intravenous Once  . sodium chloride flush  3 mL Intravenous Q12H  . vitamin B-12  5,000 mcg Oral Daily   Continuous Infusions: . sodium chloride    . furosemide 120 mg (01/13/20 0813)   PRN Meds: sodium chloride, albuterol, ALPRAZolam, ALPRAZolam, ondansetron (ZOFRAN) IV, saline, sodium chloride flush   Vital Signs    Vitals:   01/13/20 0555 01/13/20 0807 01/13/20 0813 01/13/20 0814  BP: (!) 158/99 (!) 143/76    Pulse: 64 56 60 59  Resp: 17 16    Temp: 98.2 F (36.8 C)     TempSrc: Oral     SpO2: 93% 94% (!) 84% 92%  Weight:      Height:        Intake/Output Summary (Last 24 hours) at 01/13/2020 0907 Last data filed at 01/13/2020 0218 Gross per 24 hour  Intake 833.69 ml  Output 1200 ml  Net -366.31 ml   Last 3 Weights 01/13/2020 01/12/2020 12/21/2019  Weight (lbs) 183 lb 1.6 oz 184 lb 8 oz 177 lb  Weight (kg) 83.054 kg 83.689 kg 80.287 kg      Telemetry    A sensed BiV paced - Personally Reviewed  ECG    No new tracing - Personally Reviewed  Physical Exam   GEN: No acute distress.   Neck: No JVD Cardiac: RRR, no murmurs, rubs, or gallops.  Respiratory: a few crackles in bases bilaterally. GI: Soft, nontender, non-distended  MS: No edema; No deformity. Neuro:  Nonfocal  Psych: Normal affect   Labs    High Sensitivity Troponin:   Recent Labs  Lab 01/12/20 0501 01/12/20 0648  TROPONINIHS 24* 22*      Chemistry Recent Labs  Lab 01/12/20 0501 01/13/20 0711  NA 142 142  K  3.1* 4.2  CL 96* 100  CO2 33* 33*  GLUCOSE 162* 184*  BUN 23 22  CREATININE 1.51* 1.44*  CALCIUM 9.4 9.5  GFRNONAA 32* 34*  GFRAA 37* 39*  ANIONGAP 13 9     Hematology Recent Labs  Lab 01/12/20 0501  WBC 8.4  RBC 4.55  HGB 14.2  HCT 46.5*  MCV 102.2*  MCH 31.2  MCHC 30.5  RDW 15.2  PLT 247    BNPNo results for input(s): BNP, PROBNP in the last 168 hours.   DDimer No results for input(s): DDIMER in the last 168 hours.   Radiology    DG Chest 2 View  Result Date: 01/12/2020 CLINICAL DATA:  Chest pain. EXAM: CHEST - 2 VIEW COMPARISON:  12/05/2019. FINDINGS: AICD in stable position. Again noted cardiomegaly with pulmonary venous congestion bilateral interstitial prominence and small bilateral pleural effusions. Findings consistent CHF. Similar findings noted on prior exam. Calcified pulmonary nodules noted consistent prior granulomas disease. No pneumothorax. IMPRESSION: AICD in stable position. Cardiomegaly with pulmonary venous congestion, bilateral interstitial prominence, and small bilateral pleural effusions again noted. Findings again consistent with CHF. Similar findings noted on prior  exam. Electronically Signed   By: Marcello Moores  Register   On: 01/12/2020 05:20   DG Wrist Complete Left  Result Date: 01/12/2020 CLINICAL DATA:  Persistent left wrist pain after fall 1 month ago EXAM: LEFT WRIST - COMPLETE 3+ VIEW COMPARISON:  12/26/2019 FINDINGS: No definite acute fracture is identified. Possible subtle horizontal lucency at the distal ulna could reflect a subacute nondisplaced fracture. No definite fracture line is seen in at the radial styloid. Mildly widened scapholunate interval of 3 mm. Similar degree of osteoarthritis, most pronounced at the first [facility] and triscaphe joints. Bones are demineralized. Soft tissue swelling persists. IMPRESSION: 1. Possible subtle subacute nondisplaced fracture at the distal ulna. Correlate with point tenderness. 2. No definite acute or healing  distal radial fracture is identified. 3. Mildly widened scapholunate interval of 3 mm suggesting underlying ligamentous injury. Electronically Signed   By: Davina Poke D.O.   On: 01/12/2020 08:47    Cardiac Studies    Cath 10/24/2019  Moderate three-vessel coronary disease.  30% ostial left main.  60% ostial LAD (unchanged from 2017) with moderate mid LAD plaque and calcification.  Patent proximal to mid circumflex with diffuse 50% in-stent restenosis. Ostial circumflex contains 30 to 40% narrowing.  RCA contains up to 50% diffuse in-stent restenosis. Proximal to the stented segment is an eccentric 70% region of narrowing  Elevated LVEDP, 31 mmHg suggesting decompensated acute on chronic combined systolic heart failure.  Catheterization from right radial was compromised by difficulty with catheter advancement, most likely due to selection of the radial recurrent route to the subclavian artery requiring that catheter size be downgraded to 4 [place].  Recommendations:   No high-grade thrombotic appearing lesions noted. Three-vessel disease as noted above.  Coronary angiography/PCI attempt in the future should not be via the right radial approach.  Guideline directed therapy for acute on chronic combined systolic and diastolic heart failure. Consider advanced heart failure team consult.  Patient Profile     84 y.o. 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 Past history of AKI and hyperkalemia due to overdiuresis and MRA/KCl supplement use  Assessment & Plan    1. Acute on chronic combined systolic and diastolic heart failure - admitted for heart failure in June  2021 and was discharged on 60 mg twice daily dosing on torsemide and 12.5 mg daily of spironolactone.  Since then, torsemide has increased to 80 mg twice daily dosing. - optivol showed hypervolemia on admission, but improving. -  based on recent admission, dry weight probably around 180 lb - change to PO diuretics, possible DC in AM.  2. VT: 7 appropriate ICD shocks in last week or so. No ischemic substrate per very recent cath. Low K is frequently the trigger. Wide variations in fluid status and diuretic dose have led to recurrent problems with hyper- and hypo- kalemia. On amiodarone, higher dose 400 mg daily.  3. Nondisplaced fracture left wrist: appreciate Ortho assistance with splint. Will need OP follow up w ortho  4. CAD: Last cardiac catheterization May 2021 showed stable anatomy. No angina  5. PAD: History of subclavian stenting: Occurred in 2013  6. CKD 3b: creat at baseline. Spironolactone dose increased. Recheck at next week office appt.  7. Hyopothyroidism: elevated TSH  but normal free T4 in June. Recheck.  8. DM: need an updated A1c, but recent FSBS show fair control.  9.  HLP: intolerant to statins. Repeatedly tried to reinitiate therapy unsuccessfully (myalgia). Recommended Repatha or Praluent, but she has declined.  For questions or updates, please contact [place] Please consult [email or web address] for contact info under        Signed, [name], MD  01/13/2020, 9:07 AM

## 2020-01-13 NOTE — Evaluation (Signed)
Physical Therapy Evaluation Patient Details Name: Sydney Phillips MRN: [id number] DOB: 1935-07-30 Today's Date: 01/13/2020   History of Present Illness  84 yo female with onset of SOB and lethargy was brought to hosp, had fallen at home, added fluid retention and become hypoxic.  On O2 at home, but desaturated and in resp failure from CHF.  PMHx:  O2 use, HTN, CKD3, CAD, EF 20-25%, COPD, cataracts, OSA, angioplasty, ICD, DM, tobacco use, PVD, VT  Clinical Impression  Pt was seen for mobility on RW with L side platform, and note her O2 sats dropped to 88% with the effort.  Talked with her about reducing gait distance for today, and will reattempt longer trip as she is feeling better and able to maintain her levels of O2 and pulses during the activity.  Follow acutely for these needs, focusing on her strength, balance and standing tolerance.  Pt is requiring cues continually for avoiding use of L wrist, and will continue to remind her about the implications for further injury to L wrist.    Follow Up Recommendations Home health PT;Supervision for mobility/OOB    Equipment Recommendations  None recommended by PT    Recommendations for Other Services       Precautions / Restrictions Precautions Precautions: Fall;ICD/Pacemaker Precaution Comments: had discharge of ICD  Required Braces or Orthoses: Splint/Cast Splint/Cast: L wrist immobilizer Restrictions Weight Bearing Restrictions: Yes LUE Weight Bearing: Weight bear through elbow only      Mobility  Bed Mobility               General bed mobility comments: up in chair when PT arrived  Transfers Overall transfer level: Needs assistance Equipment used: Rolling walker (2 wheeled);1 person hand held assist Transfers: Sit to/from Stand Sit to Stand: Min assist         General transfer comment: min assist to power up and help to steady on walker  Ambulation/Gait Ambulation/Gait assistance: Min guard Gait Distance  (Feet): 6 Feet (O2 sats down to 88% afterward on nasal cannula) Assistive device: Rolling walker (2 wheeled);1 person hand held assist Gait Pattern/deviations: Step-through pattern;Decreased stride length;Wide base of support Gait velocity: reduced Gait velocity interpretation: <1.31 ft/sec, indicative of household ambulator General Gait Details: pt has trouble following directions related to hearing and responding to instructions  Stairs            Wheelchair Mobility    Modified Rankin (Stroke Patients Only)       Balance Overall balance assessment: Needs assistance Sitting-balance support: Feet supported Sitting balance-Leahy Scale: Good     Standing balance support: Bilateral upper extremity supported;During functional activity Standing balance-Leahy Scale: Fair Standing balance comment: less than fair dynami balance                             Pertinent Vitals/Pain Pain Assessment: No/denies pain    Home Living Family/patient expects to be discharged to:: Private residence Living Arrangements: Children Available Help at Discharge: Family;Available 24 hours/day Type of Home: House Home Access: Stairs to enter Entrance Stairs-Rails: Left Entrance Stairs-Number of Steps: 2 Home Layout: One level Home Equipment: Walker - 2 wheels;Cane - single point;Shower seat Additional Comments: pt is using O2 at home previously    Prior Function Level of Independence: Needs assistance   Gait / Transfers Assistance Needed: home with family  ADL's / Homemaking Assistance Needed: has family assistance        Hand Dominance  Dominant Hand: Right    Extremity/Trunk Assessment   Upper Extremity Assessment Upper Extremity Assessment: Generalized weakness    Lower Extremity Assessment Lower Extremity Assessment: Generalized weakness    Cervical / Trunk Assessment Cervical / Trunk Assessment: Kyphotic  Communication   Communication: HOH (hears better  on R ear, no augmented hearing devices with her)  Cognition Arousal/Alertness: Awake/alert Behavior During Therapy: Flat affect Overall Cognitive Status: Within Functional Limits for tasks assessed                                 General Comments: pt is answering appropriately as her hearing allows      General Comments General comments (skin integrity, edema, etc.): pt was seen for mobility on RW with L side platform but is limited to walk due to desaturation with short gait trip.      Exercises     Assessment/Plan    PT Assessment Patient needs continued PT services  PT Problem List Decreased strength;Decreased range of motion;Decreased activity tolerance;Decreased balance;Decreased knowledge of use of DME;Decreased safety awareness;Cardiopulmonary status limiting activity;Obesity       PT Treatment Interventions DME instruction;Gait training;Stair training;Functional mobility training;Therapeutic activities;Therapeutic exercise;Balance training;Neuromuscular re-education;Patient/family education    PT Goals (Current goals can be found in the Care Plan section)  Acute Rehab PT Goals Patient Stated Goal: to get back to PLOF PT Goal Formulation: With patient Time For Goal Achievement: 01/27/20 Potential to Achieve Goals: Good    Frequency Min 3X/week   Barriers to discharge Other (comment) has family at home and only 2 steps    Co-evaluation               AM-PAC PT "6 Clicks" Mobility  Outcome Measure Help needed turning from your back to your side while in a flat bed without using bedrails?: A Little Help needed moving from lying on your back to sitting on the side of a flat bed without using bedrails?: A Little Help needed moving to and from a bed to a chair (including a wheelchair)?: A Little Help needed standing up from a chair using your arms (e.g., wheelchair or bedside chair)?: A Little Help needed to walk in hospital room?: A Little Help  needed climbing 3-5 steps with a railing? : A Lot 6 Click Score: 17    End of Session Equipment Utilized During Treatment: Gait belt;Oxygen Activity Tolerance: Patient tolerated treatment well;Treatment limited secondary to medical complications (Comment) Patient left: in chair;with call bell/phone within reach;with chair alarm set Nurse Communication: Mobility status PT Visit Diagnosis: Unsteadiness on feet (R26.81);Muscle weakness (generalized) (M62.81);Other (comment) (reduced O2 sats)    Time: 1572-6203 PT Time Calculation (min) (ACUTE ONLY): 36 min   Charges:   PT Evaluation $PT Eval Moderate Complexity: 1 Mod         Ramond Dial 01/13/2020, 12:20 PM  Mee Hives, PT MS Acute Rehab Dept. Number: [place]

## 2020-01-13 NOTE — Care Management Obs Status (Signed)
[place] NOTIFICATION   Patient Details  Name: SORCHA ROTUNNO MRN: [id number] Date of Birth: [birth year]   Medicare Observation Status Notification Given:  Yes    Bethena Roys, RN 01/13/2020, 6:08 PM

## 2020-01-14 DIAGNOSIS — I5042 Chronic combined systolic (congestive) and diastolic (congestive) heart failure: Secondary | ICD-10-CM | POA: Diagnosis not present

## 2020-01-14 DIAGNOSIS — S52615A Nondisplaced fracture of left ulna styloid process, initial encounter for closed fracture: Secondary | ICD-10-CM | POA: Diagnosis present

## 2020-01-14 DIAGNOSIS — E1151 Type 2 diabetes mellitus with diabetic peripheral angiopathy without gangrene: Secondary | ICD-10-CM | POA: Diagnosis present

## 2020-01-14 DIAGNOSIS — J9611 Chronic respiratory failure with hypoxia: Secondary | ICD-10-CM | POA: Diagnosis present

## 2020-01-14 DIAGNOSIS — F418 Other specified anxiety disorders: Secondary | ICD-10-CM | POA: Diagnosis present

## 2020-01-14 DIAGNOSIS — K219 Gastro-esophageal reflux disease without esophagitis: Secondary | ICD-10-CM | POA: Diagnosis present

## 2020-01-14 DIAGNOSIS — I252 Old myocardial infarction: Secondary | ICD-10-CM | POA: Diagnosis not present

## 2020-01-14 DIAGNOSIS — I2510 Atherosclerotic heart disease of native coronary artery without angina pectoris: Secondary | ICD-10-CM | POA: Diagnosis present

## 2020-01-14 DIAGNOSIS — F0390 Unspecified dementia without behavioral disturbance: Secondary | ICD-10-CM | POA: Diagnosis present

## 2020-01-14 DIAGNOSIS — H9190 Unspecified hearing loss, unspecified ear: Secondary | ICD-10-CM | POA: Diagnosis present

## 2020-01-14 DIAGNOSIS — E1122 Type 2 diabetes mellitus with diabetic chronic kidney disease: Secondary | ICD-10-CM | POA: Diagnosis present

## 2020-01-14 DIAGNOSIS — Z20822 Contact with and (suspected) exposure to covid-19: Secondary | ICD-10-CM | POA: Diagnosis present

## 2020-01-14 DIAGNOSIS — J449 Chronic obstructive pulmonary disease, unspecified: Secondary | ICD-10-CM | POA: Diagnosis present

## 2020-01-14 DIAGNOSIS — G8929 Other chronic pain: Secondary | ICD-10-CM | POA: Diagnosis present

## 2020-01-14 DIAGNOSIS — I272 Pulmonary hypertension, unspecified: Secondary | ICD-10-CM | POA: Diagnosis present

## 2020-01-14 DIAGNOSIS — W1830XA Fall on same level, unspecified, initial encounter: Secondary | ICD-10-CM | POA: Diagnosis present

## 2020-01-14 DIAGNOSIS — K5909 Other constipation: Secondary | ICD-10-CM | POA: Diagnosis present

## 2020-01-14 DIAGNOSIS — G4700 Insomnia, unspecified: Secondary | ICD-10-CM | POA: Diagnosis present

## 2020-01-14 DIAGNOSIS — I130 Hypertensive heart and chronic kidney disease with heart failure and stage 1 through stage 4 chronic kidney disease, or unspecified chronic kidney disease: Secondary | ICD-10-CM | POA: Diagnosis present

## 2020-01-14 DIAGNOSIS — I472 Ventricular tachycardia: Secondary | ICD-10-CM | POA: Diagnosis not present

## 2020-01-14 DIAGNOSIS — E785 Hyperlipidemia, unspecified: Secondary | ICD-10-CM | POA: Diagnosis present

## 2020-01-14 DIAGNOSIS — J310 Chronic rhinitis: Secondary | ICD-10-CM | POA: Diagnosis present

## 2020-01-14 DIAGNOSIS — Z4502 Encounter for adjustment and management of automatic implantable cardiac defibrillator: Secondary | ICD-10-CM | POA: Diagnosis not present

## 2020-01-14 DIAGNOSIS — E876 Hypokalemia: Secondary | ICD-10-CM | POA: Diagnosis not present

## 2020-01-14 DIAGNOSIS — I5043 Acute on chronic combined systolic (congestive) and diastolic (congestive) heart failure: Secondary | ICD-10-CM | POA: Diagnosis present

## 2020-01-14 DIAGNOSIS — N1832 Chronic kidney disease, stage 3b: Secondary | ICD-10-CM | POA: Diagnosis present

## 2020-01-14 DIAGNOSIS — M797 Fibromyalgia: Secondary | ICD-10-CM | POA: Diagnosis present

## 2020-01-14 DIAGNOSIS — Z95810 Presence of automatic (implantable) cardiac defibrillator: Secondary | ICD-10-CM | POA: Diagnosis not present

## 2020-01-14 DIAGNOSIS — I255 Ischemic cardiomyopathy: Secondary | ICD-10-CM | POA: Diagnosis present

## 2020-01-14 LAB — TSH: TSH: 11.651 u[IU]/mL — ABNORMAL HIGH (ref 0.350–4.500)

## 2020-01-14 LAB — BASIC METABOLIC PANEL
Anion gap: 12 (ref 5–15)
BUN: 24 mg/dL — ABNORMAL HIGH (ref 8–23)
CO2: 33 mmol/L — ABNORMAL HIGH (ref 22–32)
Calcium: 9.9 mg/dL (ref 8.9–10.3)
Chloride: 95 mmol/L — ABNORMAL LOW (ref 98–111)
Creatinine, Ser: 1.36 mg/dL — ABNORMAL HIGH (ref 0.44–1.00)
GFR calc Af Amer: 42 mL/min — ABNORMAL LOW (ref >60)
GFR calc non Af Amer: 36 mL/min — ABNORMAL LOW (ref >60)
Glucose, Bld: 107 mg/dL — ABNORMAL HIGH (ref 70–99)
Potassium: 4 mmol/L (ref 3.5–5.1)
Sodium: 140 mmol/L (ref 135–145)

## 2020-01-14 LAB — LIPID PANEL
Cholesterol: 300 mg/dL — ABNORMAL HIGH (ref 0–200)
HDL: 37 mg/dL — ABNORMAL LOW (ref >40)
LDL Cholesterol: 223 mg/dL — ABNORMAL HIGH (ref 0–99)
Total CHOL/HDL Ratio: 8.1 RATIO
Triglycerides: 201 mg/dL — ABNORMAL HIGH (ref <150)
VLDL: 40 mg/dL (ref 0–40)

## 2020-01-14 LAB — T4, FREE: Free T4: 0.94 ng/dL (ref 0.61–1.12)

## 2020-01-14 LAB — HEMOGLOBIN A1C
Hgb A1c MFr Bld: 6.7 % — ABNORMAL HIGH (ref 4.8–5.6)
Mean Plasma Glucose: 145.59 mg/dL

## 2020-01-14 MED ORDER — FUROSEMIDE 10 MG/ML IJ SOLN
80.0000 mg | Freq: Two times a day (BID) | INTRAMUSCULAR | Status: DC
  Administered 2020-01-14: 80 mg via INTRAVENOUS
  Filled 2020-01-14: qty 8

## 2020-01-14 NOTE — Progress Notes (Signed)
Progress Note  Patient Name: Sydney Phillips Date of Encounter: 01/14/2020  [facility] Cardiologist: [name], MD   Subjective   Pt denies CP   Sats drop to 86 % with walking   Wears O2 only at night at home   [place]  Wants to go home   Inpatient Medications    Scheduled Meds:  amiodarone  200 mg Oral BID   bisoprolol  5 mg Oral Daily   clopidogrel  75 mg Oral Daily   DULoxetine  60 mg Oral BID   heparin  5,000 Units Subcutaneous Q8H   potassium chloride  20 mEq Oral Daily   sodium chloride flush  3 mL Intravenous Once   sodium chloride flush  3 mL Intravenous Q12H   torsemide  80 mg Oral BID   vitamin B-12  5,000 mcg Oral Daily   Continuous Infusions:  sodium chloride     PRN Meds: sodium chloride, albuterol, ALPRAZolam, ALPRAZolam, ondansetron (ZOFRAN) IV, saline, sodium chloride flush   Vital Signs    Vitals:   01/13/20 1724 01/13/20 1950 01/13/20 2346 01/14/20 0346  BP: (!) 140/65 126/72 (!) 141/74 (!) 136/79  Pulse: 58 59 59 59  Resp: 20 17 18 16   Temp: 98 F (36.7 C) 98.1 F (36.7 C) 98.2 F (36.8 C) 97.9 F (36.6 C)  TempSrc: Oral Oral Oral Axillary  SpO2: 94% 94% 94% 92%  Weight:    81 kg  Height:        Intake/Output Summary (Last 24 hours) at 01/14/2020 1213 Last data filed at 01/14/2020 0653 Gross per 24 hour  Intake 684 ml  Output 1150 ml  Net -466 ml   Last 3 Weights 01/14/2020 01/13/2020 01/12/2020  Weight (lbs) 178 lb 9.2 oz 183 lb 1.6 oz 184 lb 8 oz  Weight (kg) 81 kg 83.054 kg 83.689 kg      Telemetry    AV  BiV paced - Personally Reviewed  ECG    No EKG - Personally Reviewed  Physical Exam   GEN: No acute distress.   Neck: JVP is normal   Cardiac: RRR, no murmurs.  Respiratory: Relatviely clear  GI: Soft, nontender, non-distended  MS: No edema; No deformity. Neuro:  Nonfocal  Psych: Normal affect   Labs    High Sensitivity Troponin:   Recent Labs  Lab 01/12/20 0501 01/12/20 0648  TROPONINIHS 24*  22*      Chemistry Recent Labs  Lab 01/12/20 0501 01/13/20 0711 01/14/20 0231  NA 142 142 140  K 3.1* 4.2 4.0  CL 96* 100 95*  CO2 33* 33* 33*  GLUCOSE 162* 184* 107*  BUN 23 22 24*  CREATININE 1.51* 1.44* 1.36*  CALCIUM 9.4 9.5 9.9  GFRNONAA 32* 34* 36*  GFRAA 37* 39* 42*  ANIONGAP 13 9 12      Hematology Recent Labs  Lab 01/12/20 0501  WBC 8.4  RBC 4.55  HGB 14.2  HCT 46.5*  MCV 102.2*  MCH 31.2  MCHC 30.5  RDW 15.2  PLT 247    BNPNo results for input(s): BNP, PROBNP in the last 168 hours.   DDimer No results for input(s): DDIMER in the last 168 hours.   Radiology    No results found.  Cardiac Studies    Cath 10/24/2019  Moderate three-vessel coronary disease.  30% ostial left main.  60% ostial LAD (unchanged from 2017) with moderate mid LAD plaque and calcification.  Patent proximal to mid circumflex with diffuse 50% in-stent  restenosis. Ostial circumflex contains 30 to 40% narrowing.  RCA contains up to 50% diffuse in-stent restenosis. Proximal to the stented segment is an eccentric 70% region of narrowing  Elevated LVEDP, 31 mmHg suggesting decompensated acute on chronic combined systolic heart failure.  Catheterization from right radial was compromised by difficulty with catheter advancement, most likely due to selection of the radial recurrent route to the subclavian artery requiring that catheter size be downgraded to 4 [place].  Recommendations:   No high-grade thrombotic appearing lesions noted. Three-vessel disease as noted above.  Coronary angiography/PCI attempt in the future should not be via the right radial approach.  Guideline directed therapy for acute on chronic combined systolic and diastolic heart failure. Consider advanced heart failure team consult.  Patient Profile     84 y.o. 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 Past history of AKI and hyperkalemia due to overdiuresis and MRA/KCl supplement use  Assessment & Plan    1. Acute on chronic combined systolic and diastolic heart failure - admitted for heart failure in June 2021 and was discharged on 60 mg twice daily dosing on torsemide and 12.5 mg daily of spironolactone.  Since then, torsemide has increased to 80 mg twice daily dosing. - optivol showed hypervolemia on admission, but improving. -  based on recent admission, dry weight probably around 180 lb  With desaturations today I would ocntinue IV lasix     She has had several admits for CHF   Trying to avoid bounce back Strict I/O  Follow O2    2. VT: 7 appropriate ICD shocks in last week or so. No ischemic substrate per very recent cath. Low K is frequently the trigger. Wide variations in fluid status and diuretic dose have led to recurrent problems with hyper- and hypo- kalemia. On amiodarone, higher dose 400 mg daily.  3. Nondisplaced fracture left wrist: appreciate Ortho assistance with splint. Will need OP follow up w ortho  4. CAD: Last cardiac catheterization May 2021 showed stable anatomy. No angina  5. PAD: History of subclavian stenting: Occurred in 2013  6. CKD 3b: creat at baseline. Spironolactone dose increased. Recheck at next week office appt.  7. Hyopothyroidism: elevated TSH  T4 is normal    8. DM: need an updated A1c, but recent FSBS show fair control.  9.  HLP: intolerant to statins. Repeatedly tried to reinitiate therapy unsuccessfully (myalgia). Recommended Repatha or Praluent, but she has declined.  Will review with pt but labs today LDL 223  HDL 37  Trig 201    For questions or updates, please contact [place] Please consult [email or web address] for  contact info under        Signed, Dorris Carnes, MD  01/14/2020, 12:13 PM

## 2020-01-14 NOTE — Progress Notes (Addendum)
Met with pt and son. Discussed platform form the RW. Son reports that he will get the platform at a DME store. Asked nurse to provide son with the order for the DME.

## 2020-01-14 NOTE — Care Management (Addendum)
01-14-20 0825 Patient presented for chest pain and ICD discharge. Patient presented from home with support of son. Patient has a rolling walker in the home and will benefit from a platform for rolling walker. Case Manager will place order for platform. Patient is on oxygen at home 2-3 liters qhs. Patient is currently on 02 this am. Case Manager has reached out to [employer] to confirm if oxygen order is continuous vs qhs only. Case Manager discussed home health services with the patient and patient has declined home health. Patient states she will be fine when she returns home and does not want anyone in her house. No home health services at this time. No further needs from Case Manager. Bethena Roys, RN,BSN Case Manager   0921 01-14-20 Confirmed that patient has 02 at 2 Liters continuous at home. Left platform will be delivered to the home address-trying to see if the company can overnight. Case Manager may need to provide order to patient and family take it to a medical supply store to purchase. Bethena Roys, RN,BSN Case Manager    1015 01-14-20 [employer] will be able to deliver the plat form by Wednesday to the patient's home. Order has been printed on the 6 E printer, staff RN to provide order to patient if she wants to get outpatient at a medical supply store. Covering Case Manager will continue to follow. Bethena Roys, RN,BSN Case Manager

## 2020-01-14 NOTE — Progress Notes (Signed)
Patient's family requesting preference for rx to take to supply store to pick up platform for rolling walker instead of waiting for delivery. Rx tubed to 6e, nurse aware.

## 2020-01-14 NOTE — Evaluation (Signed)
Occupational Therapy Evaluation Patient Details Name: Sydney Phillips MRN: [id number] DOB: 13-Apr-1936 Today's Date: 01/14/2020    History of Present Illness 84 yo female with onset of SOB and lethargy was brought to hosp, had fallen at home, added fluid retention and become hypoxic.  On O2 at home, but desaturated and in resp failure from CHF.  PMHx:  O2 use, HTN, CKD3, CAD, EF 20-25%, COPD, cataracts, OSA, angioplasty, ICD, DM, tobacco use, PVD, VT   Clinical Impression   PTA pt living with family and functioning at mod I level. She reports using RW at baseline and states she is able to complete BADL/IADL at mod I with increased time/rest breaks. At time of eval, pt presents with ability to complete bed mobility at supervision and sit <> stands at min A level with L platform walker. Pt requires intermittent min A for BADL 2/2 LUE restrictions. Reinforced WB precautions and importance of digit and shoulder mobilization. Pt desat to 88% on RA, but states she "does not need O2 all the time". Pt also resistive to therapy and stating she does not need it and "will be just fine when she gets home". Given current status, recommend HHOT, but pt is adamantly refusing. Will continue to follow while acute.    Follow Up Recommendations  Home health OT;Other (comment) (pt is refusing)    Equipment Recommendations  None recommended by OT    Recommendations for Other Services       Precautions / Restrictions Precautions Precautions: Fall;ICD/Pacemaker Precaution Comments: admitted with discharge at ICD Required Braces or Orthoses: Splint/Cast Splint/Cast: L wrist immobilizer Restrictions Weight Bearing Restrictions: Yes LUE Weight Bearing: Weight bear through elbow only      Mobility Bed Mobility Overal bed mobility: Needs Assistance Bed Mobility: Supine to Sit     Supine to sit: Supervision     General bed mobility comments: increased time, effort  Transfers Overall transfer level:  Needs assistance Equipment used: Left platform walker Transfers: Sit to/from Stand Sit to Stand: Min assist              Balance Overall balance assessment: Needs assistance Sitting-balance support: Feet supported Sitting balance-Leahy Scale: Good     Standing balance support: Bilateral upper extremity supported;During functional activity Standing balance-Leahy Scale: Poor Standing balance comment: reliant on external support                           ADL either performed or assessed with clinical judgement   ADL Overall ADL's : Needs assistance/impaired Eating/Feeding: Set up;Sitting   Grooming: Set up;Sitting   Upper Body Bathing: Set up;Sitting   Lower Body Bathing: Minimal assistance;Sit to/from stand;Sitting/lateral leans   Upper Body Dressing : Set up;Sitting Upper Body Dressing Details (indicate cue type and reason): educated on adaptive dressing with L wrist fracture Lower Body Dressing: Minimal assistance;Sitting/lateral leans;Sit to/from stand Lower Body Dressing Details (indicate cue type and reason): increased assist 2/2 L wrist fracture Toilet Transfer: Education administrator Details (indicate cue type and reason): L platform walker Toileting- Clothing Manipulation and Hygiene: Set up;Sitting/lateral lean Toileting - Clothing Manipulation Details (indicate cue type and reason): uses R hand             Vision Baseline Vision/History: Wears glasses Wears Glasses: At all times Patient Visual Report: No change from baseline       Perception     Praxis      Pertinent Vitals/Pain Pain Assessment:  No/denies pain     Hand Dominance Right   Extremity/Trunk Assessment Upper Extremity Assessment Upper Extremity Assessment: Generalized weakness   Lower Extremity Assessment Lower Extremity Assessment: Generalized weakness       Communication Communication Communication: HOH   Cognition Arousal/Alertness:  Awake/alert Behavior During Therapy: Flat affect Overall Cognitive Status: No family/caregiver present to determine baseline cognitive functioning Area of Impairment: Safety/judgement                         Safety/Judgement: Decreased awareness of safety;Decreased awareness of deficits     General Comments: pt resistant to therapy and needing significant cues to understand deficits. Stating she can go home and "do everything just fine when she gets there"   General Comments  pt desat to 88% on RA; requires frequent rest breaks    Exercises     Shoulder Instructions      Home Living Family/patient expects to be discharged to:: Private residence Living Arrangements: Children Available Help at Discharge: Family;Available 24 hours/day Type of Home: House Home Access: Stairs to enter [employer] of Steps: 2 Entrance Stairs-Rails: Left Home Layout: One level     Bathroom Shower/Tub: Teacher, early years/pre: Standard Bathroom Accessibility: No (rollator does not fit and she has to put it outside door then walk in)   Home Equipment: Walker - 2 wheels;Cane - single point;Shower seat   Additional Comments: reports wearing 3L O2 at night      Prior Functioning/Environment Level of Independence: Needs assistance  Gait / Transfers Assistance Needed: uses walker at baseline ADL's / Homemaking Assistance Needed: reports doing BADL at mod I level with DME and will cook clean, but at slow pace            OT Problem List: Decreased strength;Decreased knowledge of use of DME or AE;Decreased knowledge of precautions;Decreased activity tolerance;Impaired UE functional use;Impaired balance (sitting and/or standing)      OT Treatment/Interventions: Self-care/ADL training;Therapeutic exercise;Patient/family education;Balance training;Therapeutic activities;DME and/or AE instruction    OT Goals(Current goals can be found in the care plan section) Acute  Rehab OT Goals Patient Stated Goal: return to independence OT Goal Formulation: With patient Time For Goal Achievement: 01/28/20 Potential to Achieve Goals: Good  OT Frequency: Min 2X/week   Barriers to D/C:            Co-evaluation              AM-PAC OT "6 Clicks" Daily Activity     Outcome Measure Help from another person eating meals?: A Little Help from another person taking care of personal grooming?: A Little Help from another person toileting, which includes using toliet, bedpan, or urinal?: A Little Help from another person bathing (including washing, rinsing, drying)?: A Little Help from another person to put on and taking off regular upper body clothing?: A Little Help from another person to put on and taking off regular lower body clothing?: A Little 6 Click Score: 18   End of Session Nurse Communication: Mobility status  Activity Tolerance: Patient tolerated treatment well Patient left: in bed;with call bell/phone within reach  OT Visit Diagnosis: Other abnormalities of gait and mobility (R26.89);Unsteadiness on feet (R26.81);History of falling (Z91.81)                Time: 6606-3016 OT Time Calculation (min): 26 min Charges:  OT General Charges $OT Visit: 1 Visit OT Evaluation $OT Eval Moderate Complexity: 1 Mod OT Treatments $Self  Care/Home Management : 8-22 mins  Zenovia Jarred, MSOT, OTR/L Acute Rehabilitation Services [facility] Office Number: [phone] Pager: [phone]  Zenovia Jarred 01/14/2020, 10:19 AM

## 2020-01-15 DIAGNOSIS — R946 Abnormal results of thyroid function studies: Secondary | ICD-10-CM

## 2020-01-15 DIAGNOSIS — S62102A Fracture of unspecified carpal bone, left wrist, initial encounter for closed fracture: Secondary | ICD-10-CM

## 2020-01-15 LAB — BASIC METABOLIC PANEL
Anion gap: 11 (ref 5–15)
BUN: 30 mg/dL — ABNORMAL HIGH (ref 8–23)
CO2: 34 mmol/L — ABNORMAL HIGH (ref 22–32)
Calcium: 9.8 mg/dL (ref 8.9–10.3)
Chloride: 94 mmol/L — ABNORMAL LOW (ref 98–111)
Creatinine, Ser: 1.51 mg/dL — ABNORMAL HIGH (ref 0.44–1.00)
GFR calc Af Amer: 37 mL/min — ABNORMAL LOW (ref >60)
GFR calc non Af Amer: 32 mL/min — ABNORMAL LOW (ref >60)
Glucose, Bld: 114 mg/dL — ABNORMAL HIGH (ref 70–99)
Potassium: 4.3 mmol/L (ref 3.5–5.1)
Sodium: 139 mmol/L (ref 135–145)

## 2020-01-15 MED ORDER — TORSEMIDE 20 MG PO TABS: 80.0000 mg | ORAL_TABLET | Freq: Two times a day (BID) | ORAL | Status: DC

## 2020-01-15 MED ORDER — TORSEMIDE 20 MG PO TABS: 60.0000 mg | ORAL_TABLET | Freq: Every day | ORAL | Status: DC

## 2020-01-15 MED ORDER — POTASSIUM CHLORIDE CRYS ER 20 MEQ PO TBCR: 20.0000 meq | EXTENDED_RELEASE_TABLET | Freq: Every day | ORAL | 3 refills | Status: AC

## 2020-01-15 NOTE — Discharge Summary (Addendum)
Discharge Summary    Patient ID: Sydney Phillips MRN: [id number]; DOB: 1936-05-02  Admit date: 01/12/2020 Discharge date: 01/15/2020  Primary Care Provider: Laurey Morale, MD  Primary Cardiologist: [name], MD  Primary Electrophysiologist:  [name], MD   Discharge Diagnoses    Principal Problem:   ICD (implantable cardioverter-defibrillator) discharge Active Problems:   Acute on chronic combined systolic and diastolic CHF (congestive heart failure) (HCC)   V-tach ([facility])   Hyperlipidemia LDL goal <70   Hypertension   Coronary atherosclerosis   Hypokalemia   S/P angioplasty with stent, lt. subclavian 07/31/11   CKD (chronic kidney disease), stage III   Chronic respiratory failure with hypoxia (HCC)   LBBB (left bundle branch block)   Type 2 diabetes mellitus with diabetic chronic kidney disease (HCC)   Abnormal thyroid function test   Fracture of left wrist   Diagnostic Studies/Procedures    N/A _____________   History of Present Illness     Sydney Phillips is a 84 y.o. 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  She has had variable LV function over the years. Her EF was 20% in 2019, however after CRT-D therapy, EF improved to 50% in 2013, but since then, EF decreased again. She underwent cardiac catheterization for NSTEMI in November 2017 and underwent stenting of RCA and left circumflex artery at the time, she also had moderate ostial LAD lesion. She patient was seen by EP service for ICD shock in January 2021 due to noncompliance with her potassium and magnesium supplementation. 2D echo March 2021 showed LVEF 20-25% and nuc was felt to be negative for ischemia. She  was readmitted with V. fib and ICD shock in May 2021 after self discontinuing her amiodarone therapy.  Hospital course was complicated by acute on chronic combined CHF requiring IV diuretic.  Due to concern of ischemic etiology behind V. fib, patient underwent cardiac catheterization on 10/24/2019 which showed stable 60% ostial LAD lesion, 30% ostial left main lesion, 50% in-stent restenosis in the proximal to mid left circumflex artery, 30 to 40% ostial left circumflex artery stenosis, 50% in-stent restenosis in the RCA stent, LVEDP 31 mmHg.  Patient was eventually discharged on 80 mg twice daily of torsemide and 12.5 mg daily of spironolactone.  However due to worsening renal function and hyperkalemia, spironolactone was later discontinued.  Unfortunately patient was readmitted to the hospital in June 2021 with recurrent heart failure.  She was diuresed more than 6 L and was eventually discharged with a dry weight of 179 pounds.  Since discharge, she said her weight has been between 179 to 181 pounds.  Although she was discharged on 60 mg twice daily of torsemide and 12.5 mg daily of spironolactone, torsemide dosage was eventually increased back up to 80 mg twice daily.  On recent remote transmission, she has had multiple device discharges.  The team had a hard time reaching out to the patient. She got off the bed around 3 AM the morning of admission try to find her dog and upon returning to the bed, she felt like to hammer dropped on her chest. She suspected her device went off therefore sought medical attention at [facility], ED. Upon evaluation in the ED she did admit  to shortness of breath with exertion but had not noticed any PND recently. On arrival, potassium was 3.1. Creatinine was 1.51 which was near baseline. CXR showed pulmonary venous congestion and bilateral interstitial prominence consistent with CHF. Device interrogation show that she has had 3 device discharges however a total of 7 discharge from  7/24. Of note, patient was recently seen by her PCP on 7/7 after fall at home. Wrist x-ray at the time showed a nondisplaced fracture at the base of the radial styloid. She was referred to orthopedic surgery. Repeat x-ray of the left wrist continue to show possible subtle subacute nondisplaced fracture in the right distal ulna. She was admitted for further management.   Hospital Course    1. Acute on chronic combined systolic and diastolic heart failure, with hypoxia requiring home O2 (chronic respiratory failure): - thoracic impedence was low c/w volume overload - she was not wearing home O2 as instructed prior to admission, potentially contributing to CHF exacerbation - diuresed with IV Lasix this admission - per Dr. Sallyanne Kuster, it's been a big challenge to identify a stable diuretic regimen. On at least 2 occasions she has had AKI and/or severe hypokalemia due to overdiuresis and she has had numerous episodes of HF exacerbation, despite the fact that she is enrolled in the remote CHF device clinic - weight 184.5lb->178.79lb (around 180 was previous dry weight) - Cr relatively stable within baseline although BUN beginning to rise therefore now back on oral torsemide. Discharge medication dosing discussed with Dr. Harrington Challenger since she was on spironolactone at home (but not ordered while inpatient) and torsemide 80mg  BID at home (currently 60mg  BID) - Dr Harrington Challenger verbally recommends torsemide 80mg  BID with KCl 32meq daily - per discussion with care management, she had previous order for home O2 at 2L continuous but she apparently was only wearing this at night (the patient reported to staff that her O2 "usually runs in the 80s"). Per O2 assessment discussion with nurse, she went down to 83% on RA at rest and 85% with ambulation. With 2L she consistently maintained above 90% so per Dr. Harrington Challenger she was encouraged to wear 2L continuously including at rest, with exertion and at night  2. VT: 7 appropriate ICD shocks in  last week or so. No ischemic substrate per very recent cath. Low K is frequently the trigger. Wide variations in fluid status and diuretic dose have led to recurrent problems with hyper- and hypo- kalemia. Potassium repleted this admission. To continue amiodarone 200mg  BID. K is now 4.3  today. Was also not taking her Magnesium at home. Levels were normal in 11/2019 while being supplemented therefore asked to resume. She was advised no driving. Consider BMET/Mg at f/u visit on Friday.  3. Nondisplaced fracture left wrist: appreciate Ortho assistance with splint. Will need OP follow up w ortho - per their team should f/u with Dr. Jeannie Fend in 2 weeks, number provided  4. CAD: Last cardiac catheterization May 2021 showed stable anatomy. No angina this admission. Troponin level was felt insignificant.  5. PAD: History of subclavian stenting: Occurred in 2013.  6. CKD stage III: Creatinine range 1.3-1.5 range this admission which appears near baseline.     7. Hypothyroidism: elevated TSH at 11.651, free T4 normal - consider repeat as outpatient given amiodarone use. Would need to hold Biotin before repeating next value so will ask her to hold until next appointment.  8. DM: A1C 6.7, not on any home agents upon review.  9. HLP: intolerant to  statins. Repeatedly tried to reinitiate therapy unsuccessfully (myalgia). Recommended Repatha or Praluent, but she has declined. Can be revisited as outpatient.  Dr. Harrington Challenger has seen and examined the patient today and feels she is stable for discharge. She has close f/u with Dr. Sallyanne Kuster on 01/20/20 which we will keep. Otherwise guaifenesin and pantoprazole were removed from home med list since she indicated she was no longer taking these.  Did the patient have an acute coronary syndrome (MI, NSTEMI, STEMI, etc) this admission?:  No                               Did the patient have a percutaneous coronary intervention (stent / angioplasty)?:  No.    _____________  Discharge Vitals Blood pressure 107/69, pulse 59, temperature 98 F (36.7 C), temperature source Oral, resp. rate 16, height 5\' 5"  (1.651 m), weight 81.1 kg, SpO2 97 %.  Filed Weights   01/13/20 0218 01/14/20 0346 01/15/20 0554  Weight: 83.1 kg 81 kg 81.1 kg    Labs & Radiologic Studies    CBC    Component Value Date/Time   WBC 8.4 01/12/2020 0501   RBC 4.55 01/12/2020 0501   HGB 14.2 01/12/2020 0501   HGB 13.6 02/25/2017 1419   HCT 46.5 (H) 01/12/2020 0501   HCT 40.9 02/25/2017 1419   PLT 247 01/12/2020 0501   PLT 294 02/25/2017 1419   MCV 102.2 (H) 01/12/2020 0501   MCV 91 02/25/2017 1419   MCH 31.2 01/12/2020 0501   MCHC 30.5 01/12/2020 0501   RDW 15.2 01/12/2020 0501   RDW 16.2 (H) 02/25/2017 1419   LYMPHSABS 1.2 12/05/2019 1259   MONOABS 1.0 12/05/2019 1259   EOSABS 0.4 12/05/2019 1259   BASOSABS 0.0 12/05/2019 0300    Basic Metabolic Panel Recent Labs    01/14/20 0231 01/15/20 0319  NA 140 139  K 4.0 4.3  CL 95* 94*  CO2 33* 34*  GLUCOSE 107* 114*  BUN 24* 30*  CREATININE 1.36* 1.51*  CALCIUM 9.9 9.8   High Sensitivity Troponin:   Recent Labs  Lab 01/12/20 0501 01/12/20 0648  TROPONINIHS 24* 22*    Hemoglobin A1C Recent Labs    01/14/20 0231  HGBA1C 6.7*   Fasting Lipid Panel Recent Labs    01/14/20 0231  CHOL 300*  HDL 37*  LDLCALC 223*  TRIG 201*  CHOLHDL 8.1   Thyroid Function Tests Recent Labs    01/14/20 0231  TSH 11.651*   _____________  DG Chest 2 View  Result Date: 01/12/2020 CLINICAL DATA:  Chest pain. EXAM: CHEST - 2 VIEW COMPARISON:  12/05/2019. FINDINGS: AICD in stable position. Again noted cardiomegaly with pulmonary venous congestion bilateral interstitial prominence and small bilateral pleural effusions. Findings consistent CHF. Similar findings noted on prior exam. Calcified pulmonary nodules noted consistent prior granulomas disease. No pneumothorax. IMPRESSION: AICD in stable position.  Cardiomegaly with pulmonary venous congestion, bilateral interstitial prominence, and small bilateral pleural effusions again noted. Findings again consistent with CHF. Similar findings noted on prior exam. Electronically Signed   By: Marcello Moores  Register   On: 01/12/2020 05:20   DG Elbow Complete Left  Result Date: 12/27/2019 CLINICAL DATA:  Left elbow pain for 3 days since a fall. EXAM: LEFT ELBOW - COMPLETE 3+ VIEW COMPARISON:  Plain films left elbow 08/08/2011. FINDINGS: Healed olecranon fracture is identified with 2 K-wires and a figure of 8 tension band. No joint effusion. Mild degenerative  change about the articulation of the ulna and trochlea noted. IMPRESSION: No acute abnormality. Healed olecranon fracture with fixation hardware in place. Electronically Signed   By: Inge Rise M.D.   On: 12/27/2019 09:28   DG Wrist Complete Left  Result Date: 01/12/2020 CLINICAL DATA:  Persistent left wrist pain after fall 1 month ago EXAM: LEFT WRIST - COMPLETE 3+ VIEW COMPARISON:  12/26/2019 FINDINGS: No definite acute fracture is identified. Possible subtle horizontal lucency at the distal ulna could reflect a subacute nondisplaced fracture. No definite fracture line is seen in at the radial styloid. Mildly widened scapholunate interval of 3 mm. Similar degree of osteoarthritis, most pronounced at the first [facility] and triscaphe joints. Bones are demineralized. Soft tissue swelling persists. IMPRESSION: 1. Possible subtle subacute nondisplaced fracture at the distal ulna. Correlate with point tenderness. 2. No definite acute or healing distal radial fracture is identified. 3. Mildly widened scapholunate interval of 3 mm suggesting underlying ligamentous injury. Electronically Signed   By: Davina Poke D.O.   On: 01/12/2020 08:47   DG Wrist Complete Left  Result Date: 12/27/2019 CLINICAL DATA:  Left wrist pain for 3 days since a fall. Initial encounter. EXAM: LEFT WRIST - COMPLETE 3+ VIEW COMPARISON:  None.  FINDINGS: Soft tissues about the wrist are swollen. A subtle cortical step-off is seen on the posterior aspect of the distal radius on the lateral view and there is subtle lucency through the base of the radial styloid. Scattered osteoarthritis is worst at the first [facility] joint. IMPRESSION: Marked soft tissue swelling about the wrist with a likely nondisplaced fracture through the base of the radial styloid. Osteoarthritis. Electronically Signed   By: Inge Rise M.D.   On: 12/27/2019 09:27   Disposition   Pt is being discharged home today in good condition.  Follow-up Plans & Appointments     Follow-up [facility] Follow up.   Why: Left platform for rolling walker Contact information: [place] [address] [phone]        Croitoru, Dani Gobble, MD Follow up.   Specialty: Cardiology Why: Keep follow-up appointment as scheduled Friday January 20, 2020 at 8:20 AM. Contact information: [address] [place] [address] [phone]              Discharge Instructions    Diet - low sodium heart healthy   Complete by: As directed    Discharge instructions   Complete by: As directed    Guaifenesin and pantoprazole were removed from medicine list since you indicated you were no longer taking these.  You were started on a potassium supplement (new prescription sent in) and should also continue your magnesium supplement at home.  Your spironolactone was stopped for now. You can discuss the plan for this medication at follow-up with Dr. Sallyanne Kuster.  Please stop your biotin for now so that Dr. Sallyanne Kuster can repeat your thyroid tests in the near future (this supplement interacts with thyroid testing).   Increase activity slowly   Complete by: As directed       Discharge Medications   Allergies as of 01/15/2020      Reactions   Potassium-containing Compounds Other (See Comments)   Causes severe constipation    Neomycin-polymyxin B Gu    Swollen throat    Other Other (See Comments)   "mycin" antibiotics cause sleeplessness and itching   Azithromycin Rash   Codeine Itching   Darvon Itching   Erythromycin Rash  Meloxicam Other (See Comments)   Unknown    Norco [hydrocodone-acetaminophen] Itching   Penicillins Rash, Other (See Comments)   Has patient had a PCN reaction causing immediate rash, facial/tongue/throat swelling, SOB or lightheadedness with hypotension: unknown Has patient had a PCN reaction causing severe rash involving mucus membranes or skin necrosis: no Has patient had a PCN reaction that required hospitalization: unknown Has patient had a PCN reaction occurring within the last 10 years: no If all of the above answers are "NO", then may proceed with Cephalosporin use.   Propoxyphene N-acetaminophen Itching   Rofecoxib Other (See Comments)   Unknown    Rosuvastatin Other (See Comments)   cramps   Statins Itching, Other (See Comments)   Sleeplessness Sleeplessness   Sulfa Antibiotics Rash      Medication List    STOP taking these medications   Biotin 5000 MCG Caps   guaiFENesin 600 MG 12 hr tablet Commonly known as: MUCINEX   pantoprazole 40 MG tablet Commonly known as: PROTONIX   spironolactone 25 MG tablet Commonly known as: ALDACTONE     TAKE these medications   Accu-Chek FastClix Lancets Misc USE   TO CHECK GLUCOSE ONCE DAILY What changed:   how much to take  how to take this  when to take this  additional instructions   Accu-Chek SmartView test strip Generic drug: glucose blood USE  STRIP TO CHECK GLUCOSE ONCE DAILY What changed: See the new instructions.   albuterol (2.5 MG/3ML) 0.083% nebulizer solution Commonly known as: PROVENTIL USE 1 VIAL IN NEBULIZER EVERY 4 HOURS AS NEEDED FOR WHEEZING FOR SHORTNESS OF BREATH What changed: See the new instructions.   ProAir HFA 108 (90 Base) MCG/ACT inhaler Generic drug: albuterol INHALE 2 PUFFS BY  MOUTH EVERY 4 HOURS AS NEEDED FOR WHEEZING AND FOR SHORTNESS OF BREATH What changed: See the new instructions.   allopurinol 300 MG tablet Commonly known as: ZYLOPRIM Take 1 tablet by mouth once daily   ALPRAZolam 1 MG tablet Commonly known as: XANAX TAKE 1 TABLET BY MOUTH IN THE MORNING AND 1 & 1/2 (ONE & ONE-HALF) NIGHTLY AS NEEDED FOR ANXIETY What changed: See the new instructions.   amiodarone 200 MG tablet Commonly known as: PACERONE Take 1 tablet (200 mg total) by mouth 2 (two) times daily.   aspirin 81 MG chewable tablet Chew 1 tablet (81 mg total) by mouth daily.   azelastine 0.1 % nasal spray Commonly known as: ASTELIN USE 2 SPRAY(S) IN EACH NOSTRIL TWICE DAILY AS DIRECTED What changed: See the new instructions.   bisoprolol 5 MG tablet Commonly known as: ZEBETA Take 1 tablet (5 mg total) by mouth daily.   clopidogrel 75 MG tablet Commonly known as: PLAVIX Take 1 tablet by mouth once daily   Cyanocobalamin 5000 MCG Tbdp Take 5,000 mcg by mouth daily.   DULoxetine 60 MG capsule Commonly known as: CYMBALTA Take 1 capsule (60 mg total) by mouth 2 (two) times daily.   loratadine 10 MG tablet Commonly known as: CLARITIN Take 10 mg by mouth daily.   Magnesium Oxide 400 MG Caps Take 1 capsule (400 mg total) by mouth daily.   nitroGLYCERIN 0.4 MG SL tablet Commonly known as: NITROSTAT Place 1 tablet (0.4 mg total) under the tongue every 5 (five) minutes as needed for chest pain. X 3 doses What changed:   when to take this  additional instructions   potassium chloride SA 20 MEQ tablet Commonly known as: KLOR-CON Take 1 tablet (20 mEq  total) by mouth daily. Start taking on: January 16, 2020   torsemide 20 MG tablet Commonly known as: DEMADEX Take 4 tablets (80 mg total) by mouth 2 (two) times daily.   VITAMIN C PO Take 1 tablet by mouth in the morning and at bedtime.   Vitamin D 50 MCG (2000 UT) Caps Take 4,000 Units by mouth daily.             Durable Medical Equipment  (From admission, onward)         Start     Ordered   01/14/20 0841  For home use only DME Other see comment  Once       Comments: Patient needs left platform for rolling walker  Question:  Length of Need  Answer:  Lifetime   01/14/20 0841             Outstanding Labs/Studies   Recommend OP CBC, BMET, thyroid function testing  Duration of Discharge Encounter   Greater than 30 minutes including physician time.  Signed, Charlie Pitter, PA-C 01/15/2020, 4:51 PM

## 2020-01-15 NOTE — Progress Notes (Signed)
Progress Note  Patient Name: Sydney Phillips Date of Encounter: 01/15/2020  [facility] Cardiologist: [name], MD   Subjective   Denies CP  Breathing is good in bed     Inpatient Medications    Scheduled Meds:  amiodarone  200 mg Oral BID   bisoprolol  5 mg Oral Daily   clopidogrel  75 mg Oral Daily   DULoxetine  60 mg Oral BID   furosemide  80 mg Intravenous BID   heparin  5,000 Units Subcutaneous Q8H   potassium chloride  20 mEq Oral Daily   sodium chloride flush  3 mL Intravenous Once   sodium chloride flush  3 mL Intravenous Q12H   vitamin B-12  5,000 mcg Oral Daily   Continuous Infusions:  sodium chloride     PRN Meds: sodium chloride, albuterol, ALPRAZolam, ALPRAZolam, ondansetron (ZOFRAN) IV, saline, sodium chloride flush   Vital Signs    Vitals:   01/14/20 2049 01/15/20 0000 01/15/20 0357 01/15/20 0554  BP: 105/68 117/68 107/69   Pulse: 60 61 63   Resp: 16 16 16    Temp: 97.7 F (36.5 C) 98 F (36.7 C)  98 F (36.7 C)  TempSrc: Oral Oral  Oral  SpO2: 96% 98% 97%   Weight:    81.1 kg  Height:        Intake/Output Summary (Last 24 hours) at 01/15/2020 0708 Last data filed at 01/15/2020 0603 Gross per 24 hour  Intake 822 ml  Output 700 ml  Net 122 ml   Last 3 Weights 01/15/2020 01/14/2020 01/13/2020  Weight (lbs) 178 lb 12.7 oz 178 lb 9.2 oz 183 lb 1.6 oz  Weight (kg) 81.1 kg 81 kg 83.054 kg      Telemetry    AV  BiV paced - Personally Reviewed  ECG    No EKG - Personally Reviewed  Physical Exam   GEN: No acute distress.    Angry   Neck: JVP does not appear elevated   Cardiac: RRR, no murmurs.  Respiratory: Relatviely clear  GI: Soft, nontender, non-distended  MS: No edema; No deformity. Neuro:  Nonfocal  Psych: Normal affect   Labs    High Sensitivity Troponin:   Recent Labs  Lab 01/12/20 0501 01/12/20 0648  TROPONINIHS 24* 22*      Chemistry Recent Labs  Lab 01/13/20 0711 01/14/20 0231 01/15/20 0319    NA 142 140 139  K 4.2 4.0 4.3  CL 100 95* 94*  CO2 33* 33* 34*  GLUCOSE 184* 107* 114*  BUN 22 24* 30*  CREATININE 1.44* 1.36* 1.51*  CALCIUM 9.5 9.9 9.8  GFRNONAA 34* 36* 32*  GFRAA 39* 42* 37*  ANIONGAP 9 12 11      Hematology Recent Labs  Lab 01/12/20 0501  WBC 8.4  RBC 4.55  HGB 14.2  HCT 46.5*  MCV 102.2*  MCH 31.2  MCHC 30.5  RDW 15.2  PLT 247    BNPNo results for input(s): BNP, PROBNP in the last 168 hours.   DDimer No results for input(s): DDIMER in the last 168 hours.   Radiology    No results found.  Cardiac Studies    Cath 10/24/2019  Moderate three-vessel coronary disease.  30% ostial left main.  60% ostial LAD (unchanged from 2017) with moderate mid LAD plaque and calcification.  Patent proximal to mid circumflex with diffuse 50% in-stent restenosis. Ostial circumflex contains 30 to 40% narrowing.  RCA contains up to 50% diffuse in-stent restenosis. Proximal  to the stented segment is an eccentric 70% region of narrowing  Elevated LVEDP, 31 mmHg suggesting decompensated acute on chronic combined systolic heart failure.  Catheterization from right radial was compromised by difficulty with catheter advancement, most likely due to selection of the radial recurrent route to the subclavian artery requiring that catheter size be downgraded to 4 [place].  Recommendations:   No high-grade thrombotic appearing lesions noted. Three-vessel disease as noted above.  Coronary angiography/PCI attempt in the future should not be via the right radial approach.  Guideline directed therapy for acute on chronic combined systolic and diastolic heart failure. Consider advanced heart failure team consult.  Patient Profile     84 y.o. 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 Past history of AKI and hyperkalemia due to overdiuresis and MRA/KCl supplement use  Assessment & Plan    1. Acute on chronic combined systolic and diastolic heart failure - admitted for heart failure in June 2021 and was discharged on 60 mg twice daily dosing on torsemide and 12.5 mg daily of spironolactone.  Since then, torsemide has increased to 80 mg twice daily dosing. - optivol showed hypervolemia on admission, but improving. - Yesterday desaturated with walking     Volume status is not bad in bed   With bump in Cr will switch back to oral torsemide  Follow sats with walkng   May need home O2 during daytime with ambulation    Wears at night  May be close to d/c  2. VT: 7 appropriate ICD shocks in last week or so. No ischemic substrate per very recent cath. Low K is frequently the trigger. Wide variations in fluid status and diuretic dose have led to recurrent problems with hyper- and hypo- kalemia. On amiodarone, higher dose 400 mg daily.  K now is 4.3    3. Nondisplaced fracture left wrist: appreciate Ortho assistance with splint. Will need OP follow up w ortho  4. CAD: Last cardiac catheterization May 2021 showed stable anatomy. No angina  5. PAD: History of subclavian stenting: Occurred in 2013  6. CKD 3b: Cr 1.51    7. Hyopothyroidism: elevated TSH  T4 is normal    8. DM: need an updated A1c, but recent FSBS show fair control.  9.  HLP: intolerant to statins. Repeatedly tried to reinitiate therapy unsuccessfully (myalgia). Recommended Repatha or Praluent, but she has declined.  Will review with pt but labs today LDL 223  HDL 37  Trig 201    For questions or updates, please contact [place] Please consult [email or web address] for contact info under        Signed, Dorris Carnes, MD  01/15/2020, 7:08 AM

## 2020-01-15 NOTE — Progress Notes (Addendum)
SATURATION QUALIFICATIONS: (This note is used to comply with regulatory documentation for home oxygen)  Patient Saturations on Room Air at Rest = 83%  Patient Saturations on Room Air while Ambulating = 85%  Patient Saturations on 2 Liters of oxygen while Ambulating = 90 - 91%  Please briefly explain why patient needs home oxygen:

## 2020-01-16 ENCOUNTER — Ambulatory Visit (INDEPENDENT_AMBULATORY_CARE_PROVIDER_SITE_OTHER): Payer: Medicare Other

## 2020-01-16 DIAGNOSIS — Z95810 Presence of automatic (implantable) cardiac defibrillator: Secondary | ICD-10-CM

## 2020-01-16 DIAGNOSIS — I5042 Chronic combined systolic (congestive) and diastolic (congestive) heart failure: Secondary | ICD-10-CM

## 2020-01-18 ENCOUNTER — Other Ambulatory Visit: Payer: Self-pay

## 2020-01-18 ENCOUNTER — Inpatient Hospital Stay (HOSPITAL_COMMUNITY)
Admission: EM | Admit: 2020-01-18 | Discharge: 2020-01-23 | DRG: 177 | Disposition: A | Discharge status: Home Health | Payer: Medicare Other | Attending: Internal Medicine | Admitting: Internal Medicine

## 2020-01-18 DIAGNOSIS — U071 COVID-19: Principal | ICD-10-CM

## 2020-01-18 DIAGNOSIS — Z880 Allergy status to penicillin: Secondary | ICD-10-CM

## 2020-01-18 DIAGNOSIS — I10 Essential (primary) hypertension: Secondary | ICD-10-CM | POA: Diagnosis present

## 2020-01-18 DIAGNOSIS — K219 Gastro-esophageal reflux disease without esophagitis: Secondary | ICD-10-CM | POA: Diagnosis present

## 2020-01-18 DIAGNOSIS — K317 Polyp of stomach and duodenum: Secondary | ICD-10-CM | POA: Diagnosis present

## 2020-01-18 DIAGNOSIS — N1832 Chronic kidney disease, stage 3b: Secondary | ICD-10-CM | POA: Diagnosis present

## 2020-01-18 DIAGNOSIS — Z800 Family history of malignant neoplasm of digestive organs: Secondary | ICD-10-CM

## 2020-01-18 DIAGNOSIS — I252 Old myocardial infarction: Secondary | ICD-10-CM

## 2020-01-18 DIAGNOSIS — Z833 Family history of diabetes mellitus: Secondary | ICD-10-CM

## 2020-01-18 DIAGNOSIS — Z885 Allergy status to narcotic agent status: Secondary | ICD-10-CM

## 2020-01-18 DIAGNOSIS — I272 Pulmonary hypertension, unspecified: Secondary | ICD-10-CM | POA: Diagnosis present

## 2020-01-18 DIAGNOSIS — E039 Hypothyroidism, unspecified: Secondary | ICD-10-CM | POA: Diagnosis present

## 2020-01-18 DIAGNOSIS — K649 Unspecified hemorrhoids: Secondary | ICD-10-CM | POA: Diagnosis present

## 2020-01-18 DIAGNOSIS — Z7902 Long term (current) use of antithrombotics/antiplatelets: Secondary | ICD-10-CM

## 2020-01-18 DIAGNOSIS — I130 Hypertensive heart and chronic kidney disease with heart failure and stage 1 through stage 4 chronic kidney disease, or unspecified chronic kidney disease: Secondary | ICD-10-CM | POA: Diagnosis present

## 2020-01-18 DIAGNOSIS — Z888 Allergy status to other drugs, medicaments and biological substances status: Secondary | ICD-10-CM

## 2020-01-18 DIAGNOSIS — Z7982 Long term (current) use of aspirin: Secondary | ICD-10-CM

## 2020-01-18 DIAGNOSIS — I472 Ventricular tachycardia: Secondary | ICD-10-CM | POA: Diagnosis present

## 2020-01-18 DIAGNOSIS — I2510 Atherosclerotic heart disease of native coronary artery without angina pectoris: Secondary | ICD-10-CM | POA: Diagnosis present

## 2020-01-18 DIAGNOSIS — I447 Left bundle-branch block, unspecified: Secondary | ICD-10-CM | POA: Diagnosis present

## 2020-01-18 DIAGNOSIS — Z8249 Family history of ischemic heart disease and other diseases of the circulatory system: Secondary | ICD-10-CM

## 2020-01-18 DIAGNOSIS — M549 Dorsalgia, unspecified: Secondary | ICD-10-CM | POA: Diagnosis present

## 2020-01-18 DIAGNOSIS — I5042 Chronic combined systolic (congestive) and diastolic (congestive) heart failure: Secondary | ICD-10-CM | POA: Diagnosis present

## 2020-01-18 DIAGNOSIS — F418 Other specified anxiety disorders: Secondary | ICD-10-CM | POA: Diagnosis present

## 2020-01-18 DIAGNOSIS — M1990 Unspecified osteoarthritis, unspecified site: Secondary | ICD-10-CM | POA: Diagnosis present

## 2020-01-18 DIAGNOSIS — R946 Abnormal results of thyroid function studies: Secondary | ICD-10-CM | POA: Diagnosis present

## 2020-01-18 DIAGNOSIS — Z95810 Presence of automatic (implantable) cardiac defibrillator: Secondary | ICD-10-CM

## 2020-01-18 DIAGNOSIS — N183 Chronic kidney disease, stage 3 unspecified: Secondary | ICD-10-CM | POA: Diagnosis present

## 2020-01-18 DIAGNOSIS — E1136 Type 2 diabetes mellitus with diabetic cataract: Secondary | ICD-10-CM | POA: Diagnosis present

## 2020-01-18 DIAGNOSIS — Z881 Allergy status to other antibiotic agents status: Secondary | ICD-10-CM

## 2020-01-18 DIAGNOSIS — Z8719 Personal history of other diseases of the digestive system: Secondary | ICD-10-CM

## 2020-01-18 DIAGNOSIS — E1122 Type 2 diabetes mellitus with diabetic chronic kidney disease: Secondary | ICD-10-CM

## 2020-01-18 DIAGNOSIS — H9190 Unspecified hearing loss, unspecified ear: Secondary | ICD-10-CM | POA: Diagnosis present

## 2020-01-18 DIAGNOSIS — G8929 Other chronic pain: Secondary | ICD-10-CM | POA: Diagnosis present

## 2020-01-18 DIAGNOSIS — J440 Chronic obstructive pulmonary disease with acute lower respiratory infection: Secondary | ICD-10-CM | POA: Diagnosis present

## 2020-01-18 DIAGNOSIS — S62102A Fracture of unspecified carpal bone, left wrist, initial encounter for closed fracture: Secondary | ICD-10-CM | POA: Diagnosis present

## 2020-01-18 DIAGNOSIS — Z79899 Other long term (current) drug therapy: Secondary | ICD-10-CM

## 2020-01-18 DIAGNOSIS — F329 Major depressive disorder, single episode, unspecified: Secondary | ICD-10-CM | POA: Diagnosis present

## 2020-01-18 DIAGNOSIS — I255 Ischemic cardiomyopathy: Secondary | ICD-10-CM | POA: Diagnosis present

## 2020-01-18 DIAGNOSIS — K921 Melena: Secondary | ICD-10-CM | POA: Diagnosis not present

## 2020-01-18 DIAGNOSIS — N179 Acute kidney failure, unspecified: Secondary | ICD-10-CM | POA: Diagnosis present

## 2020-01-18 DIAGNOSIS — Z95828 Presence of other vascular implants and grafts: Secondary | ICD-10-CM

## 2020-01-18 DIAGNOSIS — J449 Chronic obstructive pulmonary disease, unspecified: Secondary | ICD-10-CM | POA: Diagnosis present

## 2020-01-18 DIAGNOSIS — J9621 Acute and chronic respiratory failure with hypoxia: Secondary | ICD-10-CM | POA: Diagnosis present

## 2020-01-18 DIAGNOSIS — Z9981 Dependence on supplemental oxygen: Secondary | ICD-10-CM

## 2020-01-18 DIAGNOSIS — Z87891 Personal history of nicotine dependence: Secondary | ICD-10-CM

## 2020-01-18 DIAGNOSIS — F0390 Unspecified dementia without behavioral disturbance: Secondary | ICD-10-CM | POA: Diagnosis present

## 2020-01-18 DIAGNOSIS — E1151 Type 2 diabetes mellitus with diabetic peripheral angiopathy without gangrene: Secondary | ICD-10-CM | POA: Diagnosis present

## 2020-01-18 DIAGNOSIS — Z83438 Family history of other disorder of lipoprotein metabolism and other lipidemia: Secondary | ICD-10-CM

## 2020-01-18 DIAGNOSIS — E785 Hyperlipidemia, unspecified: Secondary | ICD-10-CM | POA: Diagnosis present

## 2020-01-18 DIAGNOSIS — J1282 Pneumonia due to coronavirus disease 2019: Secondary | ICD-10-CM | POA: Diagnosis present

## 2020-01-18 DIAGNOSIS — F419 Anxiety disorder, unspecified: Secondary | ICD-10-CM | POA: Diagnosis present

## 2020-01-18 DIAGNOSIS — M797 Fibromyalgia: Secondary | ICD-10-CM | POA: Diagnosis present

## 2020-01-18 LAB — BASIC METABOLIC PANEL
Anion gap: 14 (ref 5–15)
BUN: 38 mg/dL — ABNORMAL HIGH (ref 8–23)
CO2: 26 mmol/L (ref 22–32)
Calcium: 9.3 mg/dL (ref 8.9–10.3)
Chloride: 92 mmol/L — ABNORMAL LOW (ref 98–111)
Creatinine, Ser: 1.94 mg/dL — ABNORMAL HIGH (ref 0.44–1.00)
GFR calc Af Amer: 27 mL/min — ABNORMAL LOW (ref >60)
GFR calc non Af Amer: 24 mL/min — ABNORMAL LOW (ref >60)
Glucose, Bld: 143 mg/dL — ABNORMAL HIGH (ref 70–99)
Potassium: 4.2 mmol/L (ref 3.5–5.1)
Sodium: 132 mmol/L — ABNORMAL LOW (ref 135–145)

## 2020-01-18 LAB — CBC
HCT: 50.2 % — ABNORMAL HIGH (ref 36.0–46.0)
Hemoglobin: 15.9 g/dL — ABNORMAL HIGH (ref 12.0–15.0)
MCH: 31.5 pg (ref 26.0–34.0)
MCHC: 31.7 g/dL (ref 30.0–36.0)
MCV: 99.6 fL (ref 80.0–100.0)
Platelets: 259 10*3/uL (ref 150–400)
RBC: 5.04 MIL/uL (ref 3.87–5.11)
RDW: 15.4 % (ref 11.5–15.5)
WBC: 7 10*3/uL (ref 4.0–10.5)
nRBC: 0 % (ref 0.0–0.2)

## 2020-01-18 LAB — CBG MONITORING, ED: Glucose-Capillary: 107 mg/dL — ABNORMAL HIGH (ref 70–99)

## 2020-01-18 NOTE — ED Notes (Signed)
Pt is yelling for water.  She says if she doesn't get water she will get up and walk out.

## 2020-01-18 NOTE — Progress Notes (Signed)
EPIC Encounter for ICM Monitoring  Patient Name: Sydney Phillips is a 84 y.o. female Date: 01/18/2020 Primary Care Physican: Laurey Morale, MD Primary Cardiologist:Croitoru Electrophysiologist: Croitoru BiV Pacing: 96.7% 8/1/2021Discharge note Weight:179-181lbs     Transmission reviewed.  Hospitalized 7-29 to 8-1 due to device shock and HF.    OptivolThoracic impedanceshows improvement since being diuresed during hospitalization but suggesting the possibility fluid accumulation continues.  Prescribed:  Torsemide 20 mg4 tablets(80 mg total)by mouthtwice a day  Spironolactone 25 mg take 0.5 tablet (12.5 mg total) by mouth once a day  Labs: 01/15/2020 Creatinine 1.51, BUN 30, Potassium 4.3, Sodium 139, GFR 32-37 01/14/2020 Creatinine 1.36, BUN 24, Potassium 4.0, Sodium 140, GFR 36-42  01/13/2020 Creatinine 1.44, BUN 22, Potassium 4.2, Sodium 142, GFR 34-39  01/12/2020 Creatinine 1.51, BUN 23, Potassium 3.1, Sodium 142, GFR 32-37  12/12/2019 Creatinine 1.47, BUN 38, Potassium 3.7, Sodium 140, GFR 33-38 12/11/2019 Creatinine 1.45, BUN 34, Potassium 4.1, Sodium 139, GFR 34-39  12/10/2019 Creatinine 1.43, BUN 35, Potassium 3.6, Sodium 141, GFR 34-40  12/09/2019 Creatinine 1.43, BUN 35, Potassium 3.6, Sodium 141, GFR 37-43  A complete set of results can be found in Results Review.  Recommendations:No changes.  Patient will be seen in office 8/6 by Dr Sallyanne Kuster    Follow-up plan: ICM clinic phone appointment on8/30/2021.91 day device clinic remote transmission8/25/2021.  EP/Cardiology Office Visits: 01/20/2020 with Dr. Sallyanne Kuster.    Copy of ICM check sent to Dr. Sallyanne Kuster.   3 month ICM trend: 01/16/2020    1 Year ICM trend:       [name], RN 01/18/2020 10:19 AM

## 2020-01-18 NOTE — ED Triage Notes (Signed)
Pt presents via EMS from home for eval of generalized weakness since leaving the hospital for CHF admission 2 days ago. Pt found in her bed not wearing O2 (supposed to wear 2L O2 at all times). A/O x 4.

## 2020-01-18 NOTE — Progress Notes (Signed)
Thanks. I will try to fix her at appt.

## 2020-01-19 ENCOUNTER — Emergency Department (HOSPITAL_COMMUNITY): Payer: Medicare Other

## 2020-01-19 ENCOUNTER — Encounter (HOSPITAL_COMMUNITY): Payer: Self-pay | Admitting: Internal Medicine

## 2020-01-19 DIAGNOSIS — E1122 Type 2 diabetes mellitus with diabetic chronic kidney disease: Secondary | ICD-10-CM | POA: Diagnosis present

## 2020-01-19 DIAGNOSIS — I2510 Atherosclerotic heart disease of native coronary artery without angina pectoris: Secondary | ICD-10-CM | POA: Diagnosis present

## 2020-01-19 DIAGNOSIS — J9621 Acute and chronic respiratory failure with hypoxia: Secondary | ICD-10-CM | POA: Diagnosis present

## 2020-01-19 DIAGNOSIS — E785 Hyperlipidemia, unspecified: Secondary | ICD-10-CM | POA: Diagnosis present

## 2020-01-19 DIAGNOSIS — M549 Dorsalgia, unspecified: Secondary | ICD-10-CM | POA: Diagnosis present

## 2020-01-19 DIAGNOSIS — Z885 Allergy status to narcotic agent status: Secondary | ICD-10-CM | POA: Diagnosis not present

## 2020-01-19 DIAGNOSIS — I130 Hypertensive heart and chronic kidney disease with heart failure and stage 1 through stage 4 chronic kidney disease, or unspecified chronic kidney disease: Secondary | ICD-10-CM | POA: Diagnosis present

## 2020-01-19 DIAGNOSIS — U071 COVID-19: Secondary | ICD-10-CM | POA: Diagnosis present

## 2020-01-19 DIAGNOSIS — F418 Other specified anxiety disorders: Secondary | ICD-10-CM | POA: Diagnosis not present

## 2020-01-19 DIAGNOSIS — J1282 Pneumonia due to coronavirus disease 2019: Secondary | ICD-10-CM | POA: Diagnosis present

## 2020-01-19 DIAGNOSIS — J439 Emphysema, unspecified: Secondary | ICD-10-CM | POA: Diagnosis not present

## 2020-01-19 DIAGNOSIS — Z95810 Presence of automatic (implantable) cardiac defibrillator: Secondary | ICD-10-CM | POA: Diagnosis not present

## 2020-01-19 DIAGNOSIS — J440 Chronic obstructive pulmonary disease with acute lower respiratory infection: Secondary | ICD-10-CM | POA: Diagnosis present

## 2020-01-19 DIAGNOSIS — Z9981 Dependence on supplemental oxygen: Secondary | ICD-10-CM | POA: Diagnosis not present

## 2020-01-19 DIAGNOSIS — Z8719 Personal history of other diseases of the digestive system: Secondary | ICD-10-CM | POA: Diagnosis not present

## 2020-01-19 DIAGNOSIS — K921 Melena: Secondary | ICD-10-CM | POA: Diagnosis not present

## 2020-01-19 DIAGNOSIS — Z880 Allergy status to penicillin: Secondary | ICD-10-CM | POA: Diagnosis not present

## 2020-01-19 DIAGNOSIS — F419 Anxiety disorder, unspecified: Secondary | ICD-10-CM | POA: Diagnosis present

## 2020-01-19 DIAGNOSIS — N179 Acute kidney failure, unspecified: Secondary | ICD-10-CM | POA: Diagnosis present

## 2020-01-19 DIAGNOSIS — E039 Hypothyroidism, unspecified: Secondary | ICD-10-CM | POA: Diagnosis present

## 2020-01-19 DIAGNOSIS — N1832 Chronic kidney disease, stage 3b: Secondary | ICD-10-CM | POA: Diagnosis present

## 2020-01-19 DIAGNOSIS — F329 Major depressive disorder, single episode, unspecified: Secondary | ICD-10-CM | POA: Diagnosis present

## 2020-01-19 DIAGNOSIS — S62102D Fracture of unspecified carpal bone, left wrist, subsequent encounter for fracture with routine healing: Secondary | ICD-10-CM | POA: Diagnosis not present

## 2020-01-19 DIAGNOSIS — I472 Ventricular tachycardia: Secondary | ICD-10-CM | POA: Diagnosis present

## 2020-01-19 DIAGNOSIS — I5042 Chronic combined systolic (congestive) and diastolic (congestive) heart failure: Secondary | ICD-10-CM | POA: Diagnosis present

## 2020-01-19 DIAGNOSIS — Z881 Allergy status to other antibiotic agents status: Secondary | ICD-10-CM | POA: Diagnosis not present

## 2020-01-19 DIAGNOSIS — Z888 Allergy status to other drugs, medicaments and biological substances status: Secondary | ICD-10-CM | POA: Diagnosis not present

## 2020-01-19 LAB — D-DIMER, QUANTITATIVE: D-Dimer, Quant: 2.28 ug/mL-FEU — ABNORMAL HIGH (ref 0.00–0.50)

## 2020-01-19 LAB — TROPONIN I (HIGH SENSITIVITY)
Troponin I (High Sensitivity): 28 ng/L — ABNORMAL HIGH (ref <18)
Troponin I (High Sensitivity): 30 ng/L — ABNORMAL HIGH (ref <18)

## 2020-01-19 LAB — TRIGLYCERIDES: Triglycerides: 143 mg/dL (ref <150)

## 2020-01-19 LAB — GLUCOSE, CAPILLARY
Glucose-Capillary: 197 mg/dL — ABNORMAL HIGH (ref 70–99)
Glucose-Capillary: 181 mg/dL — ABNORMAL HIGH (ref 70–99)

## 2020-01-19 LAB — FIBRINOGEN: Fibrinogen: 556 mg/dL — ABNORMAL HIGH (ref 210–475)

## 2020-01-19 LAB — LACTATE DEHYDROGENASE: LDH: 230 U/L — ABNORMAL HIGH (ref 98–192)

## 2020-01-19 LAB — LACTIC ACID, PLASMA: Lactic Acid, Venous: 1 mmol/L (ref 0.5–1.9)

## 2020-01-19 LAB — PROCALCITONIN: Procalcitonin: <0.1 ng/mL

## 2020-01-19 LAB — SARS CORONAVIRUS 2 BY RT PCR (HOSPITAL ORDER, PERFORMED IN ~~LOC~~ HOSPITAL LAB): SARS Coronavirus 2: POSITIVE — AB

## 2020-01-19 LAB — FERRITIN: Ferritin: 108 ng/mL (ref 11–307)

## 2020-01-19 LAB — C-REACTIVE PROTEIN: CRP: 1.2 mg/dL — ABNORMAL HIGH (ref <1.0)

## 2020-01-19 MED ORDER — SODIUM CHLORIDE 0.9% FLUSH
3.0000 mL | Freq: Two times a day (BID) | INTRAVENOUS | Status: DC
  Administered 2020-01-19 – 2020-01-23 (×7): 3 mL via INTRAVENOUS

## 2020-01-19 MED ORDER — AZELASTINE HCL 0.1 % NA SOLN
2.0000 | Freq: Two times a day (BID) | NASAL | Status: DC
  Administered 2020-01-19 – 2020-01-23 (×8): 2 via NASAL
  Filled 2020-01-19 (×2): qty 30

## 2020-01-19 MED ORDER — ACETAMINOPHEN 325 MG PO TABS: 650.0000 mg | ORAL_TABLET | Freq: Four times a day (QID) | ORAL | Status: DC | PRN

## 2020-01-19 MED ORDER — ALBUTEROL SULFATE HFA 108 (90 BASE) MCG/ACT IN AERS
2.0000 | INHALATION_SPRAY | RESPIRATORY_TRACT | Status: DC | PRN
  Filled 2020-01-19: qty 6.7

## 2020-01-19 MED ORDER — OXYCODONE HCL 5 MG PO TABS: 5.0000 mg | ORAL_TABLET | ORAL | Status: DC | PRN

## 2020-01-19 MED ORDER — SODIUM CHLORIDE 0.9% FLUSH: 3.0000 mL | INTRAVENOUS | Status: DC | PRN

## 2020-01-19 MED ORDER — SODIUM CHLORIDE 0.9 % IV SOLN
200.0000 mg | Freq: Once | INTRAVENOUS | Status: AC
  Administered 2020-01-19: 200 mg via INTRAVENOUS
  Filled 2020-01-19: qty 40

## 2020-01-19 MED ORDER — METHYLPREDNISOLONE SODIUM SUCC 40 MG IJ SOLR
40.0000 mg | Freq: Two times a day (BID) | INTRAMUSCULAR | Status: DC
  Administered 2020-01-19 (×2): 40 mg via INTRAVENOUS
  Filled 2020-01-19 (×2): qty 1

## 2020-01-19 MED ORDER — BISOPROLOL FUMARATE 5 MG PO TABS
5.0000 mg | ORAL_TABLET | Freq: Every day | ORAL | Status: DC
  Administered 2020-01-20 – 2020-01-23 (×4): 5 mg via ORAL
  Filled 2020-01-19 (×4): qty 1

## 2020-01-19 MED ORDER — ONDANSETRON HCL 4 MG/2ML IJ SOLN: 4.0000 mg | Freq: Four times a day (QID) | INTRAMUSCULAR | Status: DC | PRN

## 2020-01-19 MED ORDER — AMIODARONE HCL 200 MG PO TABS
200.0000 mg | ORAL_TABLET | Freq: Two times a day (BID) | ORAL | Status: DC
  Administered 2020-01-19 – 2020-01-23 (×8): 200 mg via ORAL
  Filled 2020-01-19 (×9): qty 1

## 2020-01-19 MED ORDER — SODIUM CHLORIDE 0.9 % IV BOLUS
250.0000 mL | Freq: Once | INTRAVENOUS | Status: AC
  Administered 2020-01-19: 250 mL via INTRAVENOUS

## 2020-01-19 MED ORDER — FLEET ENEMA 7-19 GM/118ML RE ENEM: 1.0000 | ENEMA | Freq: Once | RECTAL | Status: DC | PRN

## 2020-01-19 MED ORDER — GUAIFENESIN-DM 100-10 MG/5ML PO SYRP: 10.0000 mL | ORAL_SOLUTION | ORAL | Status: DC | PRN

## 2020-01-19 MED ORDER — SODIUM CHLORIDE 0.9 % IV SOLN: 250.0000 mL | INTRAVENOUS | Status: DC | PRN

## 2020-01-19 MED ORDER — INSULIN ASPART 100 UNIT/ML ~~LOC~~ SOLN
0.0000 [IU] | Freq: Three times a day (TID) | SUBCUTANEOUS | Status: DC
  Administered 2020-01-20 – 2020-01-21 (×4): 3 [IU] via SUBCUTANEOUS
  Administered 2020-01-21 – 2020-01-22 (×2): 2 [IU] via SUBCUTANEOUS
  Administered 2020-01-22: 5 [IU] via SUBCUTANEOUS
  Administered 2020-01-22: 2 [IU] via SUBCUTANEOUS

## 2020-01-19 MED ORDER — TORSEMIDE 20 MG PO TABS
80.0000 mg | ORAL_TABLET | Freq: Two times a day (BID) | ORAL | Status: DC
  Administered 2020-01-19 – 2020-01-21 (×5): 80 mg via ORAL
  Filled 2020-01-19 (×5): qty 4

## 2020-01-19 MED ORDER — ASPIRIN 81 MG PO CHEW
81.0000 mg | CHEWABLE_TABLET | Freq: Every day | ORAL | Status: DC
  Administered 2020-01-19 – 2020-01-23 (×5): 81 mg via ORAL
  Filled 2020-01-19 (×5): qty 1

## 2020-01-19 MED ORDER — LORATADINE 10 MG PO TABS
10.0000 mg | ORAL_TABLET | Freq: Every day | ORAL | Status: DC
  Administered 2020-01-19 – 2020-01-23 (×5): 10 mg via ORAL
  Filled 2020-01-19 (×5): qty 1

## 2020-01-19 MED ORDER — INSULIN ASPART 100 UNIT/ML ~~LOC~~ SOLN
0.0000 [IU] | Freq: Every day | SUBCUTANEOUS | Status: DC
  Administered 2020-01-20: 2 [IU] via SUBCUTANEOUS

## 2020-01-19 MED ORDER — ALLOPURINOL 300 MG PO TABS
300.0000 mg | ORAL_TABLET | Freq: Every day | ORAL | Status: DC
  Administered 2020-01-19 – 2020-01-23 (×5): 300 mg via ORAL
  Filled 2020-01-19 (×5): qty 1

## 2020-01-19 MED ORDER — ALPRAZOLAM 0.25 MG PO TABS
1.0000 mg | ORAL_TABLET | Freq: Two times a day (BID) | ORAL | Status: DC | PRN
  Administered 2020-01-20: 1 mg via ORAL
  Administered 2020-01-20: 1.5 mg via ORAL
  Administered 2020-01-20 – 2020-01-21 (×2): 1 mg via ORAL
  Administered 2020-01-22 – 2020-01-23 (×2): 1.5 mg via ORAL
  Filled 2020-01-19 (×2): qty 6
  Filled 2020-01-19: qty 4
  Filled 2020-01-19: qty 6
  Filled 2020-01-19 (×3): qty 4

## 2020-01-19 MED ORDER — ENOXAPARIN SODIUM 30 MG/0.3ML ~~LOC~~ SOLN: 30.0000 mg | SUBCUTANEOUS | Status: DC

## 2020-01-19 MED ORDER — POLYETHYLENE GLYCOL 3350 17 G PO PACK: 17.0000 g | PACK | Freq: Every day | ORAL | Status: DC | PRN

## 2020-01-19 MED ORDER — CLOPIDOGREL BISULFATE 75 MG PO TABS
75.0000 mg | ORAL_TABLET | Freq: Every day | ORAL | Status: DC
  Administered 2020-01-19: 75 mg via ORAL
  Filled 2020-01-19: qty 1

## 2020-01-19 MED ORDER — SODIUM CHLORIDE 0.9 % IV SOLN
100.0000 mg | Freq: Every day | INTRAVENOUS | Status: AC
  Administered 2020-01-20 – 2020-01-23 (×4): 100 mg via INTRAVENOUS
  Filled 2020-01-19 (×4): qty 20

## 2020-01-19 MED ORDER — BISACODYL 5 MG PO TBEC
5.0000 mg | DELAYED_RELEASE_TABLET | Freq: Every day | ORAL | Status: DC | PRN
  Administered 2020-01-21: 5 mg via ORAL
  Filled 2020-01-19: qty 1

## 2020-01-19 MED ORDER — DULOXETINE HCL 60 MG PO CPEP
60.0000 mg | ORAL_CAPSULE | Freq: Two times a day (BID) | ORAL | Status: DC
  Administered 2020-01-19 – 2020-01-23 (×9): 60 mg via ORAL
  Filled 2020-01-19 (×11): qty 1

## 2020-01-19 MED ORDER — ONDANSETRON HCL 4 MG PO TABS: 4.0000 mg | ORAL_TABLET | Freq: Four times a day (QID) | ORAL | Status: DC | PRN

## 2020-01-19 NOTE — ED Notes (Signed)
Dinner Tray Ordered @ 1740. 

## 2020-01-19 NOTE — ED Provider Notes (Signed)
Blood pressure (!) 149/64, pulse 60, temperature 98.5 F (36.9 C), resp. rate (!) 25, SpO2 100 %.  Assuming care from Dr. Tyrone Nine.  In short, Sydney Phillips is a 84 y.o. female with a chief complaint of Weakness .  Refer to the original H&P for additional details.  The current plan of care is to f/u on UA and delta troponin. Social work to see for question rehab placement.   07:20 AM  Patient's lab test resulting with Covid coming back positive.  Patient is on her 3 L nasal cannula oxygen and having no increased work of breathing.  Her lab work is largely at baseline with only mild elevation in creatinine.  Chest x-ray reviewed.  Suspect Covid infection contributing to her weakness along with some mild volume overload. Patient is high risk and weak to the point of not being able to stand/walk. Will admit.    EKG Interpretation  Date/Time:  Wednesday January 18 2020 19:05:21 EDT Ventricular Rate:  60 PR Interval:  138 QRS Duration: 176 QT Interval:  520 QTC Calculation: 520 R Axis:   21 Text Interpretation: AV dual-paced rhythm with occasional ventricular-paced complexes Abnormal ECG No significant change since last tracing Confirmed by Deno Etienne [id number] on 01/19/2020 4:12:55 AM       Discussed patient's case with [facility], Dr. Lorin Mercy to request admission. Patient and family (if present) updated with plan. Care transferred to [facility] service.  I reviewed all nursing notes, vitals, pertinent old records, EKGs, labs, imaging (as available).     Margette Fast, MD 01/19/20 [phone]

## 2020-01-19 NOTE — H&P (Addendum)
History and Physical    Sydney Phillips JGG:[id number] DOB: 03/03/1936 DOA: 01/18/2020  PCP: Laurey Morale, MD Consultants:  [place] - cardiology Patient coming from:  Home; NOK: Stormy Card, [phone]  Chief Complaint: Weakness  HPI: Sydney Phillips is a 84 y.o. female with medical history significant of subclavian arterial stenosis s/p stent; pulmonary HTN; pacemaker placement; COPD on home O2; PAD; HTN; HLD; DM; depression/anxiety; dementia; stage 3 CKD; chronic pain; chronic combined CHF; and CAD s/p stents presenting with generalized weakness.  She was previously hospitalized from 7/29-8/1 after her AICD discharged, with acute on chronic combined CHF.  She has been too weak to get out of bed and was not wearing her continuous home O2.  She is currently sleeping soundly and unable to effectively answer questions.    I spoke with her son.  She has been getting increasingly weak since discharge and she got to the point where she couldn't sit up in the bed.  He got home from work yesterday and was barely able to get her out of bed to chair and she ended up lying facedown in the chair.  Her pulse ox was 84%.    EDP apparently checked guaiac due to severe weakness and noted gross melena on exam and so consulted GI.   ED Course:  Recent hospitalization on CHF service.  Generalized weakness, can't walk - + for COVID.  Review of Systems: unable to perform  Ambulatory Status:  Ambulates with a walker  COVID Vaccine Status:  None  Past Medical History:  Diagnosis Date  . Arthritis   . CAD (coronary artery disease)    a. s/p DES to LCx/RCA 05/2016, ostial LAD disease.  . Cardiomyopathy ([place])   . Chronic back pain   . Chronic combined systolic and diastolic CHF (congestive heart failure) ([place])   . CKD (chronic kidney disease), stage III   . Colon polyps 2003.  2015.   HP polyps 2003.  adnomas 2015.  required referal to [facility] for colonoscopic resection of flat polyps.   Marland Kitchen  COPD (chronic obstructive pulmonary disease) ([place])   . Dementia ([place])   . Depression with anxiety    takes Cymbalta daily  . Diabetes mellitus ([place])   . Early cataracts, bilateral   . Fibromyalgia   . GERD (gastroesophageal reflux disease)    was on meds but was taken off;now watches what she eats  . Hemorrhoids   . History of kidney stones   . HOH (hard of hearing)   . Hx of colonic polyps   . Hyperlipemia    takes Crestor daily  . Hypertension    takes Amlodipine and Metoprolol daily  . ICD (implantable cardioverter-defibrillator) discharge 01/12/2020  . Insomnia   . LBBB (left bundle branch block)    Stress test 09/03/2010, EF 55  . PAD (peripheral artery disease) (HCC)    Carotid, subclavian, and lower extremity beds, currently not symptomatic  . Presence of combination internal cardiac defibrillator (ICD) and pacemaker   . Pulmonary hypertension ([place])   . Subclavian arterial stenosis, lt, with PTA/STENT 07/31/11 08/01/2011  . Vertigo    takes Meclizine prn    Past Surgical History:  Procedure Laterality Date  . ABDOMINAL AORTAGRAM N/A 08/12/2013   Procedure: ABDOMINAL Maxcine Ham;  Surgeon: Elam Dutch, MD;  Location: [facility] CATH LAB;  Service: Cardiovascular;  Laterality: N/A;  . ABDOMINAL HYSTERECTOMY    . APPENDECTOMY    . BACK SURGERY  2012  . BIV ICD  GENERTAOR CHANGE OUT Left 02/20/2012   Procedure: BIV ICD GENERTAOR CHANGE OUT;  Surgeon: [name], MD;  Location: [facility] CATH LAB;  Service: Cardiovascular;  Laterality: Left;  . CARDIAC CATHETERIZATION  12/01/2007   By Dr. Melvern Banker, left heart cath,   . CARDIAC CATHETERIZATION N/A 05/20/2016   Procedure: Right/Left Heart Cath and Coronary Angiography;  Surgeon: Sherren Mocha, MD;  Location: [place] CV LAB;  Service: Cardiovascular;  Laterality: N/A;  . CARDIAC CATHETERIZATION N/A 05/20/2016   Procedure: Coronary Stent Intervention;  Surgeon: Sherren Mocha, MD;  Location: [place] CV LAB;  Service: Cardiovascular;   Laterality: N/A;  . CARDIAC DEFIBRILLATOR PLACEMENT  05/2008   By Dr Blanch Media, [employer] CANNOT HAVE MRI's  . CAROTID ANGIOGRAM N/A 07/31/2011   Procedure: CAROTID ANGIOGRAM;  Surgeon: Lorretta Harp, MD;  Location: [facility] CATH LAB;  Service: Cardiovascular;  Laterality: N/A;  carotid angiogram and possible Lt SCA PTA  . COLONOSCOPY W/ POLYPECTOMY  12/2013  . CORONARY ANGIOPLASTY    . ENDARTERECTOMY Left 11/08/2014   Procedure: LEFT CAROTID ENDARTERECTOMY WITH HEMASHIELD PATCH ANGIOPLASTY;  Surgeon: Elam Dutch, MD;  Location: [place];  Service: Vascular;  Laterality: Left;  . ESOPHAGOGASTRODUODENOSCOPY N/A 01/20/2014   Procedure: ESOPHAGOGASTRODUODENOSCOPY (EGD);  Surgeon: Jerene Bears, MD;  Location: [facility] ENDOSCOPY;  Service: Endoscopy;  Laterality: N/A;  . FEMORAL-POPLITEAL BYPASS GRAFT Right 10/12/2013   Procedure:   Femoral-Peroneal trunk  bypass with nonreversed greater saphenous vein graft;  Surgeon: Elam Dutch, MD;  Location: [place];  Service: Vascular;  Laterality: Right;  . GIVENS CAPSULE STUDY N/A 01/20/2014   Procedure: GIVENS CAPSULE STUDY;  Surgeon: Jerene Bears, MD;  Location: [place];  Service: Gastroenterology;  Laterality: N/A;  . ICD GENERATOR CHANGEOUT N/A 03/04/2017   Procedure: ICD Generator Changeout;  Surgeon: [name], MD;  Location: [place] CV LAB;  Service: Cardiovascular;  Laterality: N/A;  . INSERT / REPLACE / REMOVE PACEMAKER    . INTRAOPERATIVE ARTERIOGRAM Right 10/12/2013   Procedure: INTRA OPERATIVE ARTERIOGRAM;  Surgeon: Elam Dutch, MD;  Location: [place];  Service: Vascular;  Laterality: Right;  . LEFT HEART CATH AND CORONARY ANGIOGRAPHY N/A 10/24/2019   Procedure: LEFT HEART CATH AND CORONARY ANGIOGRAPHY;  Surgeon: Belva Crome, MD;  Location: [place] CV LAB;  Service: Cardiovascular;  Laterality: N/A;  . ORIF ELBOW FRACTURE  08/16/2011   Procedure: OPEN REDUCTION INTERNAL FIXATION (ORIF) ELBOW/OLECRANON FRACTURE;  Surgeon: Schuyler Amor, MD;  Location: [place];  Service: Orthopedics;  Laterality: Left;  . RENAL ANGIOGRAM N/A 08/12/2013   Procedure: RENAL ANGIOGRAM;  Surgeon: Elam Dutch, MD;  Location: [facility] CATH LAB;  Service: Cardiovascular;  Laterality: N/A;  . SUBCLAVIAN STENT PLACEMENT Left 07/31/2011   7x18 Genesis, balloon, with reduction of 90% ostial left subclavian artery stenosis to 0% with residual excellent flow  . TONSILLECTOMY    . TUBAL LIGATION      Social History   Socioeconomic History  . Marital status: Widowed    Spouse name: Not on file  . Number of children: 3  . Years of education: 98  . Highest education level: Not on file  Occupational History  . Occupation: Retired  Tobacco Use  . Smoking status: Former Smoker    Packs/day: 0.50    Years: 62.00    Pack years: 31.00    Types: Cigarettes    Quit date: 07/19/2017    Years since quitting: 2.5  . Smokeless tobacco: Never Used  Vaping  Use  . Vaping Use: Never used  Substance and Sexual Activity  . Alcohol use: No    Alcohol/week: 0.0 standard drinks  . Drug use: No  . Sexual activity: Never    Birth control/protection: Surgical  Other Topics Concern  . Not on file  Social History Narrative   Ok to share information with medical POA, Son Gabriel Carina   Right-handed   Caffeine: Pepsi   Social Determinants of Health   Financial Resource Strain: Low Risk   . Difficulty of Paying Living Expenses: Not very hard  Food Insecurity:   . Worried About Charity fundraiser in the Last Year:   . Arboriculturist in the Last Year:   Transportation Needs: No Transportation Needs  . Lack of Transportation (Medical): No  . Lack of Transportation (Non-Medical): No  Physical Activity:   . Days of Exercise per Week:   . Minutes of Exercise per Session:   Stress:   . Feeling of Stress :   Social Connections: Moderately Isolated  . Frequency of Communication with Friends and Family: More than three times a week  . Frequency of Social  Gatherings with Friends and Family: More than three times a week  . Attends Religious Services: Never  . Active Member of Clubs or Organizations: Yes  . Attends Archivist Meetings: Never  . Marital Status: Widowed  Intimate Partner Violence:   . Fear of Current or Ex-Partner:   . Emotionally Abused:   Marland Kitchen Physically Abused:   . Sexually Abused:     Allergies  Allergen Reactions  . Potassium-Containing Compounds Other (See Comments)    Causes severe constipation  . Neomycin-Polymyxin B Gu     Swollen throat   . Other Other (See Comments)    "mycin" antibiotics cause sleeplessness and itching  . Azithromycin Rash  . Codeine Itching  . Darvon Itching  . Erythromycin Rash  . Meloxicam Other (See Comments)    Unknown    . Norco [Hydrocodone-Acetaminophen] Itching  . Penicillins Rash and Other (See Comments)    Has patient had a PCN reaction causing immediate rash, facial/tongue/throat swelling, SOB or lightheadedness with hypotension: unknown Has patient had a PCN reaction causing severe rash involving mucus membranes or skin necrosis: no Has patient had a PCN reaction that required hospitalization: unknown Has patient had a PCN reaction occurring within the last 10 years: no If all of the above answers are "NO", then may proceed with Cephalosporin use.   Marland Kitchen Propoxyphene N-Acetaminophen Itching  . Rofecoxib Other (See Comments)    Unknown    . Rosuvastatin Other (See Comments)    cramps  . Statins Itching and Other (See Comments)    Sleeplessness Sleeplessness  . Sulfa Antibiotics Rash    Family History  Problem Relation Age of Onset  . CAD Father   . Heart disease Father   . Hyperlipidemia Father   . Heart disease Mother   . Deep vein thrombosis Son   . Hyperlipidemia Other   . Colon cancer Maternal Grandmother   . Cancer Sister        ovarian  . Diabetes Sister   . Heart disease Sister   . Anesthesia problems Neg Hx   . Hypotension Neg Hx   .  Malignant hyperthermia Neg Hx   . Pseudochol deficiency Neg Hx     Prior to Admission medications   Medication Sig Start Date End Date Taking? Authorizing Provider  albuterol (PROVENTIL) (2.5 MG/3ML)  0.083% nebulizer solution USE 1 VIAL IN NEBULIZER EVERY 4 HOURS AS NEEDED FOR WHEEZING FOR SHORTNESS OF BREATH Patient taking differently: Take 2.5 mg by nebulization every 4 (four) hours as needed for wheezing or shortness of breath.  05/18/19  Yes Laurey Morale, MD  allopurinol (ZYLOPRIM) 300 MG tablet Take 1 tablet by mouth once daily Patient taking differently: Take 300 mg by mouth daily.  11/22/19  Yes Laurey Morale, MD  ALPRAZolam Duanne Moron) 1 MG tablet TAKE 1 TABLET BY MOUTH IN THE MORNING AND 1 & 1/2 (ONE & ONE-HALF) NIGHTLY AS NEEDED FOR ANXIETY Patient taking differently: Take 1-1.5 mg by mouth See admin instructions. Take 1 tablet (1mg ) by mouth daily in the morning and 1 and 1/2 tablet (1.5mg ) by mough every night as needed for anxiety. 11/22/19  Yes Laurey Morale, MD  amiodarone (PACERONE) 200 MG tablet Take 1 tablet (200 mg total) by mouth 2 (two) times daily. 10/26/19  Yes Shirley Friar, PA-C  Ascorbic Acid (VITAMIN C PO) Take 1 tablet by mouth in the morning and at bedtime.   Yes [provider]  aspirin 81 MG chewable tablet Chew 1 tablet (81 mg total) by mouth daily. 05/23/16  Yes Reyne Dumas, MD  azelastine (ASTELIN) 0.1 % nasal spray USE 2 SPRAY(S) IN EACH NOSTRIL TWICE DAILY AS DIRECTED Patient taking differently: Place 2 sprays into both nostrils 2 (two) times daily.  08/08/19  Yes Laurey Morale, MD  bisoprolol (ZEBETA) 5 MG tablet Take 1 tablet (5 mg total) by mouth daily. 12/09/19  Yes Eugenie Filler, MD  Cholecalciferol (VITAMIN D) 50 MCG (2000 UT) CAPS Take 4,000 Units by mouth daily.    Yes [provider]  clopidogrel (PLAVIX) 75 MG tablet Take 1 tablet by mouth once daily Patient taking differently: Take 75 mg by mouth daily.  06/30/19  Yes Laurey Morale, MD  Cyanocobalamin 5000 MCG TBDP Take 5,000 mcg by mouth daily.    Yes [provider]  DULoxetine (CYMBALTA) 60 MG capsule Take 1 capsule (60 mg total) by mouth 2 (two) times daily. 01/05/19  Yes Laurey Morale, MD  loratadine (CLARITIN) 10 MG tablet Take 10 mg by mouth daily.   Yes [provider]  nitroGLYCERIN (NITROSTAT) 0.4 MG SL tablet Place 1 tablet (0.4 mg total) under the tongue every 5 (five) minutes as needed for chest pain. X 3 doses Patient taking differently: Place 0.4 mg under the tongue every 5 (five) minutes x 3 doses as needed for chest pain.  11/02/19  Yes Shirley Friar, PA-C  potassium chloride SA (KLOR-CON) 20 MEQ tablet Take 1 tablet (20 mEq total) by mouth daily. 01/16/20  Yes Fay Records, MD  PROAIR HFA 108 [phone] Base) MCG/ACT inhaler INHALE 2 PUFFS BY MOUTH EVERY 4 HOURS AS NEEDED FOR WHEEZING AND FOR SHORTNESS OF BREATH Patient taking differently: Inhale 2 puffs into the lungs every 4 (four) hours as needed for wheezing or shortness of breath.  12/02/19  Yes Laurey Morale, MD  torsemide (DEMADEX) 20 MG tablet Take 4 tablets (80 mg total) by mouth 2 (two) times daily. 12/21/19  Yes Laurey Morale, MD  [place] USE   TO CHECK GLUCOSE ONCE DAILY Patient taking differently: 1 each by Other route daily.  07/27/18   Laurey Morale, MD  ACCU-CHEK SMARTVIEW test strip USE  STRIP TO CHECK GLUCOSE ONCE DAILY Patient taking differently: 1 each by Other route daily.  06/15/19   Laurey Morale, MD  Magnesium Oxide 400 MG CAPS Take 1 capsule (400 mg total) by mouth daily. Patient not taking: Reported on 01/12/2020 07/15/19   [name], MD    Physical Exam: Vitals:   01/19/20 1330 01/19/20 1400 01/19/20 1500 01/19/20 1602  BP: (!) 129/48 (!) 133/50 115/86 (!) 129/93  Pulse: (!) 59 60 (!) 59 60  Resp: 19 (!) 22 20 18   Temp:      SpO2: 96% 93% 92% 93%     . General:  Appears somnolent but arousable and is in  NAD . Eyes:   EOMI, normal lids, iris . ENT:  grossly normal hearing, lips & tongue, mmm . Neck:  no LAD, masses or thyromegaly . Cardiovascular:  RRR, no m/r/g. No LE edema.  Marland Kitchen Respiratory:   CTA bilaterally with sparse rhonchi.  Normal to mildly increased respiratory effort. . Abdomen:  soft, NT, ND, NABS . Skin:  no rash or induration seen on limited exam . Musculoskeletal:  grossly normal tone BUE/BLE, good ROM, no bony abnormality . Psychiatric:  somnolent mood and affect, speech sparse, arouses easily and then lapses back into slumber . Neurologic:  Unable to perform    Radiological Exams on Admission: DG Chest 2 View  Result Date: 01/19/2020 CLINICAL DATA:  Fatigue.  History of CHF. EXAM: CHEST - 2 VIEW COMPARISON:  12/05/2019.  09/30/2019. FINDINGS: AICD in stable position. Again noted cardiomegaly with bilateral interstitial prominence suggesting CHF. Small bilateral pleural effusion may be present. Calcified left lung nodule consistent with granuloma again noted. No pneumothorax. Degenerative change thoracic spine. Lower thoracic spine compression fracture noted on today's exam. IMPRESSION: 1. AICD in stable position. Cardiomegaly with bilateral interstitial prominence again noted suggesting CHF. Similar findings noted on prior exam. Small bilateral pleural effusions may be present. 2. Lower thoracic vertebral body compression fracture may be present. Electronically Signed   By: Marcello Moores  Register   On: 01/19/2020 05:14    EKG: Independently reviewed.  AV paced rhythm with PVCs with rate 60; no evidence of acute ischemia   Labs on Admission: I have personally reviewed the available labs and imaging studies at the time of the admission.  Pertinent labs:   Glucose 143 BUN 38/Creatinine 1.94/GFR 24; slightly worse than baseline stage 3b CKD HS troponin 28 WBC 7.0 LDH 230 Ferritin 108 CRP 1.2 Lactate 1.0 Procalcitonin <0.10 D-dimer 2.28 Fibrinogen 556 COVID  POSITIVE   Assessment/Plan Principal Problem:   COVID-19 Active Problems:   Hyperlipidemia LDL goal <70   Depression with anxiety   Hypertension   COPD (chronic obstructive pulmonary disease) (HCC)   Implantable cardioverter-defibrillator (ICD) in situ   CKD (chronic kidney disease), stage III   Type 2 diabetes mellitus with diabetic chronic kidney disease (HCC)   Abnormal thyroid function test   Fracture of left wrist   Severe fatigued associated with COVID-19 infection -Patient presenting with severe fatigue following recent hospitalization -She does not have an additional O2 requirement (wears at home due to COPD); O2 sats as low as 92% -COVID POSITIVE -The patient has comorbidities which may increase the risk for ARDS/MODS including: age, HTN, DM, COPD -Pertinent labs concerning for COVID include normal WBC count; increased BUN/Creatinine; increased LDH; markedly elevated D-dimer (>1); low procalcitonin; mildly elevated CRP; increased fibrinogen -CXR with multifocal opacities which may be c/w COVID vs.CHF -Will not treat with broad-spectrum antibiotics given procalcitonin <0.1 -Will admit for further evaluation, close monitoring, and treatment -Monitor on telemetry x at least  24 hours -At this time, will attempt to avoid use of aerosolized medications and use HFAs instead -Will check daily labs including BMP with Mag, Phos; LFTs; CBC with differential; CRP; ferritin; fibrinogen; D-dimer -Will order steroids and Remdesivir (pharmacy consult) given +COVID test, +CXR -If the patient shows clinical deterioration, consider transfer to ICU with PCCM consultation -Consider Tocilizumab and/or convalescent plasma if the patient does not stabilize on current treatment or if the patient has marked clinical decompensation; the patient does not appear to require these treatments at this time. -Will attempt to maintain euvolemia to a net negative fluid status -Will ask the patient to maintain  an awake prone position for 16+ hours a day, if possible, with a minimum of 2-3 hours at a time -With D-dimer <5, will use standard-dosed Lovenox for DVT prevention -Patient was seen wearing full PPE including: gown, gloves, head cover, N95, and face shield; donning and doffing was in compliance with current standards.  Melena -Noted by EDP (not reported to me and not visible in note, presumably checked due to severe fatigue) -Will send hemoccult -GI was consulted overnight, Dr. Ardis Hughs to see -Hgb appears at/better than usual baseline  Chronic combined CHF -08/24/19 echo with EF 20-25% and grade 2 diastolic dysfunction -She was recently admitted for this issue and was effectively diuresed -Creatinine is slightly increased today compared to baseline -Has AICD placement with multiple recent shocks, but apparently none since last d/c -Continue Amiodarone, Bisoprolol -Continue Torsemide at this time but may need to hold if creatinine does not improve  CAD/PAD -Stable cath in May -No apparent angina currently -s/p subclavian stenting remotely -Continue ASA -Hold Plavix for now given concern for melena  Recent L wrist fracture -Remains splinted -Needs outpatient orthopedics f/u following this hospitalization  Stage 3b CKD -Slightly worse than baseline currently, possibly related to acute COVID infection -Will follow  Hypothyroidism -Recent TSH was elevated at 11.651 with normal free T4 -She does not appear to be taking Synthroid -Amiodarone may be contributing to abnormal TSH -Needs close outpatient f/u with possible initiation of treatment  DM -Recent A1c 6.7 -She is not taking medications for this issue at this time -Cover with moderate-scale SSI  HLD -Has statin intolerance and declined Repatha/Praluent -Will defer to cardiology as an outpatient  Dementia; depression/anxiety -Reported in chart, unable to confirm at the time of admission -Continue Xanax,  Cymbalta    DVT prophylaxis:  Lovenox  Code Status:  Full - confirmed with prior hospitalizations Family Communication: None present; I spoke with the patient's son by telephone. Disposition Plan:  The patient is from: home  Anticipated d/c is to: home without [facility] services once her respiratory issues have been resolved.  She may require home O2 at the time of discharge.  Anticipated d/c date will depend on clinical response to treatment, likely between 3 days (with completion of outpatient Remdesivir treatment) and 5 days  Patient is currently: acutely ill Consults called: PT/OT/RT  Admission status: Admit - It is my clinical opinion that admission to [place] is reasonable and necessary because of the expectation that this patient will require hospital care that crosses at least 2 midnights to treat this condition based on the medical complexity of the problems presented.  Given the aforementioned information, the predictability of an adverse outcome is felt to be significant.    Karmen Bongo MD [facility]   How to contact the [facility] Attending or Consulting provider [place] or covering provider during after hours [place],  for this patient?  1. Check the care team in [facility] and look for a) attending/consulting TR[facility] provider listed and b) the [facility] team listed 2. Log into [email or web address] and use [place]'s universal password to access. If you do not have the password, please contact the hospital operator. 3. Locate the [facility] provider you are looking for under [facility] and page to a number that you can be directly reached. 4. If you still have difficulty reaching the provider, please page the [facility] (Director on Call) for the Hospitalists listed on amion for assistance.   01/19/2020, 6:01 PM

## 2020-01-19 NOTE — ED Provider Notes (Signed)
[place] EMERGENCY DEPARTMENT Provider Note   CSN: [id number] Arrival date & time: 01/18/20  1854     History Chief Complaint  Patient presents with  . Weakness    Sydney Phillips is a 84 y.o. female.  84 yo F with a chief complaint of fatigue.  Patient was just in the hospital for CHF and having her AICD fire.  She has significant diuresis and was discharged.  John felt she was doing okay for about 12 hours post then had severe fatigue.  Has not been able to get up out of her bed without significant assistance.  Has trouble even just sitting up.  Feels weak all over.  Denies any chest pain denies shortness of breath.  Denies abdominal pain nausea vomiting or diarrhea.  Denies decreased oral intake.  The history is provided by the patient and a relative.  Weakness Severity:  Moderate Onset quality:  Gradual Duration:  2 days Timing:  Constant Progression:  Worsening Chronicity:  New Relieved by:  Nothing Worsened by:  Nothing Ineffective treatments:  None tried Associated symptoms: no arthralgias, no chest pain, no dizziness, no dysuria, no fever, no headaches, no myalgias, no nausea, no shortness of breath, no urgency and no vomiting        Past Medical History:  Diagnosis Date  . Arthritis   . CAD (coronary artery disease)    a. s/p DES to LCx/RCA 05/2016, ostial LAD disease.  . Cardiomyopathy ([place])   . Chronic back pain   . Chronic combined systolic and diastolic CHF (congestive heart failure) ([place])   . CKD (chronic kidney disease), stage III   . Colon polyps 2003.  2015.   HP polyps 2003.  adnomas 2015.  required referal to [facility] for colonoscopic resection of flat polyps.   Marland Kitchen COPD (chronic obstructive pulmonary disease) ([place])   . Dementia ([place])   . Depression with anxiety    takes Cymbalta daily  . Diabetes mellitus ([place])   . Early cataracts, bilateral   . Fibromyalgia   . GERD (gastroesophageal reflux disease)    was on meds but was  taken off;now watches what she eats  . Hemorrhoids   . History of kidney stones   . HOH (hard of hearing)   . Hx of colonic polyps   . Hyperlipemia    takes Crestor daily  . Hypertension    takes Amlodipine and Metoprolol daily  . ICD (implantable cardioverter-defibrillator) discharge 01/12/2020  . Insomnia   . LBBB (left bundle branch block)    Stress test 09/03/2010, EF 55  . PAD (peripheral artery disease) (HCC)    Carotid, subclavian, and lower extremity beds, currently not symptomatic  . Presence of combination internal cardiac defibrillator (ICD) and pacemaker   . Pulmonary hypertension ([place])   . Subclavian arterial stenosis, lt, with PTA/STENT 07/31/11 08/01/2011  . Vertigo    takes Meclizine prn    Patient Active Problem List   Diagnosis Date Noted  . COVID-19 01/19/2020  . Abnormal thyroid function test 01/15/2020  . Fracture of left wrist 01/15/2020  . ICD (implantable cardioverter-defibrillator) discharge 01/12/2020  . Hyperlipemia   . Fibromyalgia   . Anxiety   . Acute on chronic congestive heart failure ([place]) 12/05/2019  . V-tach ([place]) 10/22/2019  . Type 2 diabetes mellitus with diabetic chronic kidney disease ([place]) 01/05/2019  . Diabetic foot ulcers ([place]) 06/18/2018  . Rhinitis, chronic 09/29/2017  . Hearing loss 09/04/2017  . Perforation of left tympanic membrane  09/04/2017  . Acute exacerbation of CHF (congestive heart failure) ([place]) 07/19/2017  . Acute on chronic combined systolic and diastolic CHF (congestive heart failure) ([place]) 07/19/2017  . Acute on chronic respiratory failure with hypoxia and hypercapnia ([place]) 07/19/2017  . Poor dentition 04/01/2017  . Tobacco abuse 12/17/2016  . 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 Chronic respiratory failure with hypoxia ([place])   . Acute encephalopathy 05/30/2016  . CKD (chronic kidney disease), stage III 05/30/2016  . Leukocytosis   . Stenosis of left subclavian artery ([place]) 05/19/2016  .  Somnolence   . OSA and COPD overlap syndrome ([place])   . Cardiomyopathy, ischemic   . Ischemic mitral valve regurgitation   . Pulmonary hypertension ([place])   . CAD S/P percutaneous coronary angioplasty   . Altered mental state 02/26/2016  . Arthralgia 10/12/2015  . Left carotid stenosis 11/08/2014  . Iron deficiency anemia 04/17/2014  . Carotid artery disease ([place]) 04/17/2014  . LBBB (left bundle branch block) 04/16/2014  . Diastolic dysfunction 53/61/4431  . NSTEMI (non-ST elevated myocardial infarction) ([place]) 01/20/2014  . Melena 01/20/2014  . Microcytic anemia 01/19/2014  . Colon polyp 03/24/2013  . S/P angioplasty with stent, lt. subclavian 07/31/11 08/01/2011  . Implantable cardioverter-defibrillator (ICD) in situ 07/28/2011  . PVD, 07/28/2011  . Vertigo 07/27/2011  . Hypokalemia 07/27/2011  . SPINAL STENOSIS 01/28/2010  . Chronic constipation 01/14/2010  . Insomnia 01/23/2009  . HIP PAIN, BILATERAL 06/21/2008  . Coronary atherosclerosis 03/30/2008  . Other primary cardiomyopathies 03/30/2008  . Myalgia and myositis 11/26/2007  . Hyperlipidemia LDL goal <70 07/28/2007  . WEIGHT GAIN 06/04/2007  . COPD (chronic obstructive pulmonary disease) ([place]) 03/02/2007  . Depression with anxiety 02/25/2007  . Hypertension 02/25/2007  . GERD 02/25/2007  . COLONIC POLYPS, HX OF 02/25/2007    Past Surgical History:  Procedure Laterality Date  . ABDOMINAL AORTAGRAM N/A 08/12/2013   Procedure: ABDOMINAL Maxcine Ham;  Surgeon: Elam Dutch, MD;  Location: [facility] CATH LAB;  Service: Cardiovascular;  Laterality: N/A;  . ABDOMINAL HYSTERECTOMY    . APPENDECTOMY    . BACK SURGERY  2012  . BIV ICD GENERTAOR CHANGE OUT Left 02/20/2012   Procedure: BIV ICD GENERTAOR CHANGE OUT;  Surgeon: [name], MD;  Location[facility] CATH LAB;  Service: Cardiovascular;  Laterality: Left;  . CARDIAC CATHETERIZATION  12/01/2007   By Dr. Melvern Banker, left heart cath,   . CARDIAC CATHETERIZATION N/A 05/20/2016    Procedure: Right/Left Heart Cath and Coronary Angiography;  Surgeon: Sherren Mocha, MD;  Location: [place] CV LAB;  Service: Cardiovascular;  Laterality: N/A;  . CARDIAC CATHETERIZATION N/A 05/20/2016   Procedure: Coronary Stent Intervention;  Surgeon: Sherren Mocha, MD;  Location: [place] CV LAB;  Service: Cardiovascular;  Laterality: N/A;  . CARDIAC DEFIBRILLATOR PLACEMENT  05/2008   By Dr Blanch Media, [employer] CANNOT HAVE MRI's  . CAROTID ANGIOGRAM N/A 07/31/2011   Procedure: CAROTID ANGIOGRAM;  Surgeon: Lorretta Harp, MD;  Location: [facility] CATH LAB;  Service: Cardiovascular;  Laterality: N/A;  carotid angiogram and possible Lt SCA PTA  . COLONOSCOPY W/ POLYPECTOMY  12/2013  . CORONARY ANGIOPLASTY    . ENDARTERECTOMY Left 11/08/2014   Procedure: LEFT CAROTID ENDARTERECTOMY WITH HEMASHIELD PATCH ANGIOPLASTY;  Surgeon: Elam Dutch, MD;  Location: [place];  Service: Vascular;  Laterality: Left;  . ESOPHAGOGASTRODUODENOSCOPY N/A 01/20/2014   Procedure: ESOPHAGOGASTRODUODENOSCOPY (EGD);  Surgeon: Jerene Bears, MD;  Location: [facility] ENDOSCOPY;  Service: Endoscopy;  Laterality: N/A;  . FEMORAL-POPLITEAL BYPASS GRAFT  Right 10/12/2013   Procedure:   Femoral-Peroneal trunk  bypass with nonreversed greater saphenous vein graft;  Surgeon: Elam Dutch, MD;  Location: [place];  Service: Vascular;  Laterality: Right;  . GIVENS CAPSULE STUDY N/A 01/20/2014   Procedure: GIVENS CAPSULE STUDY;  Surgeon: Jerene Bears, MD;  Location: [place];  Service: Gastroenterology;  Laterality: N/A;  . ICD GENERATOR CHANGEOUT N/A 03/04/2017   Procedure: ICD Generator Changeout;  Surgeon: [name], MD;  Location: [place] CV LAB;  Service: Cardiovascular;  Laterality: N/A;  . INSERT / REPLACE / REMOVE PACEMAKER    . INTRAOPERATIVE ARTERIOGRAM Right 10/12/2013   Procedure: INTRA OPERATIVE ARTERIOGRAM;  Surgeon: Elam Dutch, MD;  Location: [place];  Service: Vascular;  Laterality: Right;  . LEFT HEART CATH AND  CORONARY ANGIOGRAPHY N/A 10/24/2019   Procedure: LEFT HEART CATH AND CORONARY ANGIOGRAPHY;  Surgeon: Belva Crome, MD;  Location: [place] CV LAB;  Service: Cardiovascular;  Laterality: N/A;  . ORIF ELBOW FRACTURE  08/16/2011   Procedure: OPEN REDUCTION INTERNAL FIXATION (ORIF) ELBOW/OLECRANON FRACTURE;  Surgeon: Schuyler Amor, MD;  Location: [place];  Service: Orthopedics;  Laterality: Left;  . RENAL ANGIOGRAM N/A 08/12/2013   Procedure: RENAL ANGIOGRAM;  Surgeon: Elam Dutch, MD;  Location[facility] C[facility] LAB;  Service: Cardiovascular;  Laterality: N/A;  . SUBCLAVIAN STENT PLACEMENT Left 07/31/2011   7x18 Genesis, balloon, with reduction of 90% ostial left subclavian artery stenosis to 0% with residual excellent flow  . TONSILLECTOMY    . TUBAL LIGATION       OB History   No obstetric history on file.     Family History  Problem Relation Age of Onset  . CAD Father   . Heart disease Father   . Hyperlipidemia Father   . Heart disease Mother   . Deep vein thrombosis Son   . Hyperlipidemia Other   . Colon cancer Maternal Grandmother   . Cancer Sister        ovarian  . Diabetes Sister   . Heart disease Sister   . Anesthesia problems Neg Hx   . Hypotension Neg Hx   . Malignant hyperthermia Neg Hx   . Pseudochol deficiency Neg Hx     Social History   Tobacco Use  . Smoking status: Former Smoker    Packs/day: 0.50    Years: 62.00    Pack years: 31.00    Types: Cigarettes    Quit date: 07/19/2017    Years since quitting: 2.5  . Smokeless tobacco: Never Used  Vaping Use  . Vaping Use: Never used  Substance Use Topics  . Alcohol use: No    Alcohol/week: 0.0 standard drinks  . Drug use: No    Home Medications Prior to Admission medications   Medication Sig Start Date End Date Taking? Authorizing Provider  albuterol (PROVENTIL) (2.5 MG/3ML) 0.083% nebulizer solution USE 1 VIAL IN NEBULIZER EVERY 4 HOURS AS NEEDED FOR WHEEZING FOR SHORTNESS OF BREATH Patient taking  differently: Take 2.5 mg by nebulization every 4 (four) hours as needed for wheezing or shortness of breath.  05/18/19  Yes Laurey Morale, MD  allopurinol (ZYLOPRIM) 300 MG tablet Take 1 tablet by mouth once daily Patient taking differently: Take 300 mg by mouth daily.  11/22/19  Yes Laurey Morale, MD  ALPRAZolam Duanne Moron) 1 MG tablet TAKE 1 TABLET BY MOUTH IN THE MORNING AND 1 & 1/2 (ONE & ONE-HALF) NIGHTLY AS NEEDED FOR ANXIETY Patient taking differently:  Take 1-1.5 mg by mouth See admin instructions. Take 1 tablet (1mg ) by mouth daily in the morning and 1 and 1/2 tablet (1.5mg ) by [place]ough every night as needed for anxiety. 11/22/19  Yes Laurey Morale, MD  amiodarone (PACERONE) 200 MG tablet Take 1 tablet (200 mg total) by mouth 2 (two) times daily. 10/26/19  Yes Shirley Friar, PA-C  Ascorbic Acid (VITAMIN C PO) Take 1 tablet by mouth in the morning and at bedtime.   Yes [provider]  aspirin 81 MG chewable tablet Chew 1 tablet (81 mg total) by mouth daily. 05/23/16  Yes Reyne Dumas, MD  azelastine (ASTELIN) 0.1 % nasal spray USE 2 SPRAY(S) IN EACH NOSTRIL TWICE DAILY AS DIRECTED Patient taking differently: Place 2 sprays into both nostrils 2 (two) times daily.  08/08/19  Yes Laurey Morale, MD  bisoprolol (ZEBETA) 5 MG tablet Take 1 tablet (5 mg total) by mouth daily. 12/09/19  Yes Eugenie Filler, MD  Cholecalciferol (VITAMIN D) 50 MCG (2000 UT) CAPS Take 4,000 Units by mouth daily.    Yes [provider]  clopidogrel (PLAVIX) 75 MG tablet Take 1 tablet by mouth once daily Patient taking differently: Take 75 mg by mouth daily.  06/30/19  Yes Laurey Morale, MD  Cyanocobalamin 5000 MCG TBDP Take 5,000 mcg by mouth daily.    Yes [provider]  DULoxetine (CYMBALTA) 60 MG capsule Take 1 capsule (60 mg total) by mouth 2 (two) times daily. 01/05/19  Yes Laurey Morale, MD  loratadine (CLARITIN) 10 MG tablet Take 10 mg by mouth daily.   Yes [provider]    nitroGLYCERIN (NITROSTAT) 0.4 MG SL tablet Place 1 tablet (0.4 mg total) under the tongue every 5 (five) minutes as needed for chest pain. X 3 doses Patient taking differently: Place 0.4 mg under the tongue every 5 (five) minutes x 3 doses as needed for chest pain.  11/02/19  Yes Shirley Friar, PA-C  potassium chloride SA (KLOR-CON) 20 MEQ tablet Take 1 tablet (20 mEq total) by mouth daily. 01/16/20  Yes Fay Records, MD  PROAIR HFA 108 [phone] Base) MCG/ACT inhaler INHALE 2 PUFFS BY MOUTH EVERY 4 HOURS AS NEEDED FOR WHEEZING AND FOR SHORTNESS OF BREATH Patient taking differently: Inhale 2 puffs into the lungs every 4 (four) hours as needed for wheezing or shortness of breath.  12/02/19  Yes Laurey Morale, MD  torsemide (DEMADEX) 20 MG tablet Take 4 tablets (80 mg total) by mouth 2 (two) times daily. 12/21/19  Yes Laurey Morale, MD  [place] USE   TO CHECK GLUCOSE ONCE DAILY Patient taking differently: 1 each by Other route daily.  07/27/18   Laurey Morale, MD  ACCU-CHEK SMARTVIEW test strip USE  STRIP TO CHECK GLUCOSE ONCE DAILY Patient taking differently: 1 each by Other route daily.  06/15/19   Laurey Morale, MD    Allergies    Potassium-containing compounds, Neomycin-polymyxin b gu, Other, Azithromycin, Codeine, Darvon, Erythromycin, Meloxicam, Norco [hydrocodone-acetaminophen], Penicillins, Propoxyphene n-acetaminophen, Rofecoxib, Rosuvastatin, Statins, and Sulfa antibiotics  Review of Systems   Review of Systems  Constitutional: Negative for chills and fever.  HENT: Negative for congestion and rhinorrhea.   Eyes: Negative for redness and visual disturbance.  Respiratory: Negative for shortness of breath and wheezing.   Cardiovascular: Negative for chest pain and palpitations.  Gastrointestinal: Negative for nausea and vomiting.  Genitourinary: Negative for dysuria and urgency.  Musculoskeletal: Negative for  arthralgias and myalgias.  Skin: Negative for  pallor and wound.  Neurological: Positive for weakness (generalized). Negative for dizziness and headaches.    Physical Exam Updated Vital Signs BP 125/85 (BP Location: Right Arm)   Pulse 60   Temp 97.7 F (36.5 C) (Axillary)   Resp 18   SpO2 95%   Physical Exam Vitals and nursing note reviewed.  Constitutional:      General: She is not in acute distress.    Appearance: She is well-developed. She is not diaphoretic.  HENT:     Head: Normocephalic and atraumatic.  Eyes:     Pupils: Pupils are equal, round, and reactive to light.  Cardiovascular:     Rate and Rhythm: Normal rate and regular rhythm.     Heart sounds: No murmur heard.  No friction rub. No gallop.   Pulmonary:     Effort: Pulmonary effort is normal.     Breath sounds: No wheezing or rales.  Abdominal:     General: There is no distension.     Palpations: Abdomen is soft.     Tenderness: There is no abdominal tenderness.  Musculoskeletal:        General: No tenderness.     Cervical back: Normal range of motion and neck supple.  Skin:    General: Skin is warm and dry.  Neurological:     Mental Status: She is alert and oriented to person, place, and time.     Comments: Global weakness   Psychiatric:        Behavior: Behavior normal.     ED Results / Procedures / Treatments   Labs (all labs ordered are listed, but only abnormal results are displayed) Labs Reviewed  SARS CORONAVIRUS 2 BY RT PCR ([place], [place] [facility]) - Abnormal; Notable for the following components:      Result Value   SARS Coronavirus 2 POSITIVE (*)    All other components within normal limits  BASIC METABOLIC PANEL - Abnormal; Notable for the following components:   Sodium 132 (*)    Chloride 92 (*)    Glucose, Bld 143 (*)    BUN 38 (*)    Creatinine, Ser 1.94 (*)    GFR calc non Af Amer 24 (*)    GFR calc Af Amer 27 (*)    All other components within normal limits  CBC - Abnormal; Notable for  the following components:   Hemoglobin 15.9 (*)    HCT 50.2 (*)    All other components within normal limits  D-DIMER, QUANTITATIVE (NOT AT [facility]) - Abnormal; Notable for the following components:   D-Dimer, Quant 2.28 (*)    All other components within normal limits  LACTATE DEHYDROGENASE - Abnormal; Notable for the following components:   LDH 230 (*)    All other components within normal limits  FIBRINOGEN - Abnormal; Notable for the following components:   Fibrinogen 556 (*)    All other components within normal limits  C-REACTIVE PROTEIN - Abnormal; Notable for the following components:   CRP 1.2 (*)    All other components within normal limits  CBC WITH DIFFERENTIAL/PLATELET - Abnormal; Notable for the following components:   Hemoglobin 15.4 (*)    HCT 48.6 (*)    Lymphs Abs 0.4 (*)    All other components within normal limits  COMPREHENSIVE METABOLIC PANEL - Abnormal; Notable for the following components:   Glucose, Bld 155 (*)    BUN 42 (*)  Creatinine, Ser 1.80 (*)    Total Protein 6.3 (*)    AST 141 (*)    ALT 113 (*)    GFR calc non Af Amer 26 (*)    GFR calc Af Amer 30 (*)    Anion gap 16 (*)    All other components within normal limits  C-REACTIVE PROTEIN - Abnormal; Notable for the following components:   CRP 1.2 (*)    All other components within normal limits  D-DIMER, QUANTITATIVE (NOT AT [facility]) - Abnormal; Notable for the following components:   D-Dimer, Quant 2.27 (*)    All other components within normal limits  GLUCOSE, CAPILLARY - Abnormal; Notable for the following components:   Glucose-Capillary 197 (*)    All other components within normal limits  GLUCOSE, CAPILLARY - Abnormal; Notable for the following components:   Glucose-Capillary 181 (*)    All other components within normal limits  CBG MONITORING, ED - Abnormal; Notable for the following components:   Glucose-Capillary 107 (*)    All other components within normal limits  TROPONIN I (HIGH  SENSITIVITY) - Abnormal; Notable for the following components:   Troponin I (High Sensitivity) 28 (*)    All other components within normal limits  TROPONIN I (HIGH SENSITIVITY) - Abnormal; Notable for the following components:   Troponin I (High Sensitivity) 30 (*)    All other components within normal limits  CULTURE, BLOOD (ROUTINE X 2)  CULTURE, BLOOD (ROUTINE X 2)  LACTIC ACID, PLASMA  PROCALCITONIN  FERRITIN  TRIGLYCERIDES  FERRITIN  MAGNESIUM  PHOSPHORUS  OCCULT BLOOD X 1 CARD TO LAB, STOOL    EKG EKG Interpretation  Date/Time:  Wednesday January 18 2020 19:05:21 EDT Ventricular Rate:  60 PR Interval:  138 QRS Duration: 176 QT Interval:  520 QTC Calculation: 520 R Axis:   21 Text Interpretation: AV dual-paced rhythm with occasional ventricular-paced complexes Abnormal ECG No significant change since last tracing Confirmed by Deno Etienne [phone]) on 01/19/2020 4:12:55 AM   Radiology DG Chest 2 View  Result Date: 01/19/2020 CLINICAL DATA:  Fatigue.  History of CHF. EXAM: CHEST - 2 VIEW COMPARISON:  12/05/2019.  09/30/2019. FINDINGS: AICD in stable position. Again noted cardiomegaly with bilateral interstitial prominence suggesting CHF. Small bilateral pleural effusion may be present. Calcified left lung nodule consistent with granuloma again noted. No pneumothorax. Degenerative change thoracic spine. Lower thoracic spine compression fracture noted on today's exam. IMPRESSION: 1. AICD in stable position. Cardiomegaly with bilateral interstitial prominence again noted suggesting CHF. Similar findings noted on prior exam. Small bilateral pleural effusions may be present. 2. Lower thoracic vertebral body compression fracture may be present. Electronically Signed   By: Marcello Moores  Register   On: 01/19/2020 05:14    Procedures Procedures (including critical care time)  Medications Ordered in ED Medications  allopurinol (ZYLOPRIM) tablet 300 mg (300 mg Oral Given 01/19/20 1150)  aspirin  chewable tablet 81 mg (81 mg Oral Given 01/19/20 1151)  amiodarone (PACERONE) tablet 200 mg (200 mg Oral Given 01/19/20 2239)  bisoprolol (ZEBETA) tablet 5 mg (has no administration in time range)  torsemide (DEMADEX) tablet 80 mg (80 mg Oral Given 01/19/20 2239)  ALPRAZolam (XANAX) tablet 1-1.5 mg (1 mg Oral Given 01/20/20 0217)  DULoxetine (CYMBALTA) DR capsule 60 mg (60 mg Oral Given 01/19/20 2246)  azelastine (ASTELIN) 0.1 % nasal spray 2 spray (2 sprays Each Nare Given 01/19/20 2247)  loratadine (CLARITIN) tablet 10 mg (10 mg Oral Given 01/19/20 1150)  sodium chloride  flush (NS) 0.9 % injection 3 mL (3 mLs Intravenous Given 01/19/20 2247)  sodium chloride flush (NS) 0.9 % injection 3 mL (has no administration in time range)  0.9 %  sodium chloride infusion (has no administration in time range)  remdesivir 200 mg in sodium chloride 0.9% 250 mL IVPB (0 mg Intravenous Stopped 01/19/20 1228)    Followed by  remdesivir 100 mg in sodium chloride 0.9 % 100 mL IVPB (has no administration in time range)  albuterol (VENTOLIN HFA) 108 (90 Base) MCG/ACT inhaler 2 puff (has no administration in time range)  methylPREDNISolone sodium succinate (SOLU-MEDROL) 40 mg/mL injection 40 mg (40 mg Intravenous Given 01/19/20 2240)  guaiFENesin-dextromethorphan (ROBITUSSIN DM) 100-10 MG/5ML syrup 10 mL (has no administration in time range)  acetaminophen (TYLENOL) tablet 650 mg (has no administration in time range)  oxyCODONE (Oxy IR/ROXICODONE) immediate release tablet 5 mg (has no administration in time range)  polyethylene glycol (MIRALAX / GLYCOLAX) packet 17 g (has no administration in time range)  bisacodyl (DULCOLAX) EC tablet 5 mg (has no administration in time range)  sodium phosphate (FLEET) 7-19 GM/118ML enema 1 enema (has no administration in time range)  ondansetron (ZOFRAN) tablet 4 mg (has no administration in time range)    Or  ondansetron (ZOFRAN) injection 4 mg (has no administration in time range)  insulin  aspart (novoLOG) injection 0-15 Units (has no administration in time range)  insulin aspart (novoLOG) injection 0-5 Units (0 Units Subcutaneous Not Given 01/19/20 2253)  sodium chloride 0.9 % bolus 250 mL (0 mLs Intravenous Stopped 01/19/20 0730)    ED Course  I have reviewed the triage vital signs and the nursing notes.  Pertinent labs & imaging results that were available during my care of the patient were reviewed by me and considered in my medical decision making (see chart for details).    MDM Rules/Calculators/A&P                          84 yo F with generalized weakness.  Going on for 48hours post discharge.  ? Due deconditioning from recent hospitalization.  Could be overdiuresis.  We will give a small fluid bolus.  Screen for infection with a chest x-ray and UA.  Lab work with very mild AKI.  Will consult social work for possible placement.  Covid test has returned + will admit.   The patients results and plan were reviewed and discussed.   Any x-rays performed were independently reviewed by myself.   Differential diagnosis were considered with the presenting HPI.  Medications  allopurinol (ZYLOPRIM) tablet 300 mg (300 mg Oral Given 01/19/20 1150)  aspirin chewable tablet 81 mg (81 mg Oral Given 01/19/20 1151)  amiodarone (PACERONE) tablet 200 mg (200 mg Oral Given 01/19/20 2239)  bisoprolol (ZEBETA) tablet 5 mg (has no administration in time range)  torsemide (DEMADEX) tablet 80 mg (80 mg Oral Given 01/19/20 2239)  ALPRAZolam (XANAX) tablet 1-1.5 mg (1 mg Oral Given 01/20/20 0217)  DULoxetine (CYMBALTA) DR capsule 60 mg (60 mg Oral Given 01/19/20 2246)  azelastine (ASTELIN) 0.1 % nasal spray 2 spray (2 sprays Each Nare Given 01/19/20 2247)  loratadine (CLARITIN) tablet 10 mg (10 mg Oral Given 01/19/20 1150)  sodium chloride flush (NS) 0.9 % injection 3 mL (3 mLs Intravenous Given 01/19/20 2247)  sodium chloride flush (NS) 0.9 % injection 3 mL (has no administration in time range)  0.9 %   sodium chloride infusion (has no  administration in time range)  remdesivir 200 mg in sodium chloride 0.9% 250 mL IVPB (0 mg Intravenous Stopped 01/19/20 1228)    Followed by  remdesivir 100 mg in sodium chloride 0.9 % 100 mL IVPB (has no administration in time range)  albuterol (VENTOLIN HFA) 108 (90 Base) MCG/ACT inhaler 2 puff (has no administration in time range)  methylPREDNISolone sodium succinate (SOLU-MEDROL) 40 mg/mL injection 40 mg (40 mg Intravenous Given 01/19/20 2240)  guaiFENesin-dextromethorphan (ROBITUSSIN DM) 100-10 MG/5ML syrup 10 mL (has no administration in time range)  acetaminophen (TYLENOL) tablet 650 mg (has no administration in time range)  oxyCODONE (Oxy IR/ROXICODONE) immediate release tablet 5 mg (has no administration in time range)  polyethylene glycol (MIRALAX / GLYCOLAX) packet 17 g (has no administration in time range)  bisacodyl (DULCOLAX) EC tablet 5 mg (has no administration in time range)  sodium phosphate (FLEET) 7-19 GM/118ML enema 1 enema (has no administration in time range)  ondansetron (ZOFRAN) tablet 4 mg (has no administration in time range)    Or  ondansetron (ZOFRAN) injection 4 mg (has no administration in time range)  insulin aspart (novoLOG) injection 0-15 Units (has no administration in time range)  insulin aspart (novoLOG) injection 0-5 Units (0 Units Subcutaneous Not Given 01/19/20 2253)  sodium chloride 0.9 % bolus 250 mL (0 mLs Intravenous Stopped 01/19/20 0730)    Vitals:   01/19/20 1955 01/19/20 2028 01/20/20 0000 01/20/20 0400  BP: (!) 133/56 128/83 (!) 141/56 125/85  Pulse: 60 60 60 60  Resp: 17 16 (!) 24 18  Temp: 97.8 F (36.6 C) (!) 97.4 F (36.3 C) 97.6 F (36.4 C) 97.7 F (36.5 C)  TempSrc: Oral Oral Oral Axillary  SpO2: 93% 93% 91% 95%    Final diagnoses:  COVID-19    Admission/ observation were discussed with the admitting physician, patient and/or family and they are comfortable with the plan.   Final Clinical  Impression(s) / ED Diagnoses Final diagnoses:  [id number]    Rx / DC Orders ED Discharge Orders    None       Deno Etienne, DO 01/20/20 0801

## 2020-01-19 NOTE — ED Notes (Signed)
Lunch Tray Ordered @ 1049. °

## 2020-01-19 NOTE — Consult Note (Addendum)
[facility] Consult: 10:17 AM 01/19/2020  LOS: 0 days    Referring Provider: Dr Lorin Mercy  Primary Care Physician:  Laurey Morale, MD Primary Gastroenterologist: Dr. Hilarie Fredrickson and Dr. Olegario Messier at [facility]     Reason for Consultation:  Melena.     HPI: Sydney Phillips is a 84 y.o. female.  PMH CAD, MI/stent 2017... Peripheral vascular disease, s/p angioplasty, subclavian stent 2/2013CHF.  S/p biventricular ICD, generator change out in 02/2017 Latest 07/2017 echocardiogram with LVEF 35 to 93%, grade 2 diastolic dysfunction. Mixed ischemic and nonischemic cardiomyopathy.. Chronic 83 ASA/Plavix. Chronic respiratory failure. DM 2.  CKD3. Dementia, depression/anxiety.  12/2012 colonoscopy. Indications of average risk screening, rectal bleeding, fecal incontinence. A total of nine polyps encountered and all but one removed. One of these was a 15 mm flat polyp which was biopsied and site biopsied.  Moderate melanosis. Small mixed hemorrhoids.  Path: tubular and serrated adenomas x4, TVA x4, no HGD. 04/2013 Colonoscopy.  Dr Arsenio Loader.  Polyp fup.   Path: TAs x 2, no HGD, TA w focal HGD.   01/2014 EGD for IDA, Hgb 7, melena. 5 mm sessile polyp at gastric cardia. This was not felt to be a source of bleeding and was not resected. Otherwise normal study to D2 01/2014 capsule endoscopy:  Capsule retained in the esophagus for 2-1/2 hours, minor erythema on initial duodenal imaging otherwise negative study, no findings to explain anemia. 04/2014 Colonoscopy: Dr. Olegario Messier for surveillance of colon polyps and EMR x 3 in 12/2012. Small polyps in the ascending, transverse, sigmoid, rectum. Suggested repeat study in 3 years.  Path: TA x3, HP x 1.    Admission in June with acute on chronic heart failure, diuresed 6 L. 7/29 -01/15/2020  admission following ICD discharge, treated for acute on chronic CHF, V. Tach. A left wrist x-ray performed due to recent fall confirmed nondisplaced ulnar fracture. Wrist was splinted. She had elevated TSH of 11.6 but normal free T4.  Presented to the ED yesterday evening with generalized weakness, difficulty walking.[facility] reported she was not wearing her 3 L nasal cannula oxygen which she is supposed to wear at all times, however she was not confused.   Pt tests positive for COVID-19 delta variant.  She has not been vaccinated and is vehement in stating she does not want to get vaccinated ever. Hgb 15.9, this compares with between 12.4 and 14.9 in 11/2019, 14.2 on 01/12/2020.  MCV 99.  Platelets 259. Ferritin 108. AKI with current GFR of 27 compared with previous 37-42 last week. Na 132.  Stool at home just PTA was dark in color but not bloody.  Generally her stools are brown, daily but the stool no nausea, vomiting, abdominal pain, pyrosis.  No history of NSAIDs.  Last aspirin, Plavix was on 01/17/2020, 2 days ago.  No EtOH.  No history of liver disease, clotting disorders or GI bleeding.   Past Medical History:  Diagnosis Date  . Arthritis   . CAD (coronary artery disease)    a. s/p DES to LCx/RCA 05/2016,  ostial LAD disease.  . Cardiomyopathy ([place])   . Chronic back pain   . Chronic combined systolic and diastolic CHF (congestive heart failure) ([place])   . CKD (chronic kidney disease), stage III   . Colon polyps 2003.  2015.   HP polyps 2003.  adnomas 2015.  required referal to [facility] for colonoscopic resection of flat polyps.   Marland Kitchen COPD (chronic obstructive pulmonary disease) ([place])   . Dementia ([place])   . Depression with anxiety    takes Cymbalta daily  . Diabetes mellitus ([place])   . Early cataracts, bilateral   . Fibromyalgia   . GERD (gastroesophageal reflux disease)    was on meds but was taken off;now watches what she eats  . Hemorrhoids   . History of kidney stones   . HOH (hard of  hearing)   . Hx of colonic polyps   . Hyperlipemia    takes Crestor daily  . Hypertension    takes Amlodipine and Metoprolol daily  . ICD (implantable cardioverter-defibrillator) discharge 01/12/2020  . Insomnia   . LBBB (left bundle branch block)    Stress test 09/03/2010, EF 55  . PAD (peripheral artery disease) (H[facility])    Carotid, subclavian, and lower extremity beds, currently not symptomatic  . Presence of combination internal cardiac defibrillator (ICD) and pacemaker   . Pulmonary hypertension ([place])   . Subclavian arterial stenosis, lt, with PTA/STENT 07/31/11 08/01/2011  . Vertigo    takes Meclizine prn    Past Surgical History:  Procedure Laterality Date  . ABDOMINAL AORTAGRAM N/A 08/12/2013   Procedure: ABDOMINAL Maxcine Ham;  Surgeon: Elam Dutch, MD;  Location: [facility] CATH LAB;  Service: Cardiovascular;  Laterality: N/A;  . ABDOMINAL HYSTERECTOMY    . APPENDECTOMY    . BACK SURGERY  2012  . BIV ICD GENERTAOR CHANGE OUT Left 02/20/2012   Procedure: BIV ICD GENERTAOR CHANGE OUT;  Surgeon: [name], MD;  Location: [facility] CATH LAB;  Service: Cardiovascular;  Laterality: Left;  . CARDIAC CATHETERIZATION  12/01/2007   By Dr. Melvern Banker, left heart cath,   . CARDIAC CATHETERIZATION N/A 05/20/2016   Procedure: Right/Left Heart Cath and Coronary Angiography;  Surgeon: Sherren Mocha, MD;  Location: [place] CV LAB;  Service: Cardiovascular;  Laterality: N/A;  . CARDIAC CATHETERIZATION N/A 05/20/2016   Procedure: Coronary Stent Intervention;  Surgeon: Sherren Mocha, MD;  Location: [place] CV LAB;  Service: Cardiovascular;  Laterality: N/A;  . CARDIAC DEFIBRILLATOR PLACEMENT  05/2008   By Dr Blanch Media, [employer] CANNOT HAVE MRI's  . CAROTID ANGIOGRAM N/A 07/31/2011   Procedure: CAROTID ANGIOGRAM;  Surgeon: Lorretta Harp, MD;  Location: [facility] CATH LAB;  Service: Cardiovascular;  Laterality: N/A;  carotid angiogram and possible Lt SCA PTA  . COLONOSCOPY W/ POLYPECTOMY  12/2013  . CORONARY  ANGIOPLASTY    . ENDARTERECTOMY Left 11/08/2014   Procedure: LEFT CAROTID ENDARTERECTOMY WITH HEMASHIELD PATCH ANGIOPLASTY;  Surgeon: Elam Dutch, MD;  Location: [place];  Service: Vascular;  Laterality: Left;  . ESOPHAGOGASTRODUODENOSCOPY N/A 01/20/2014   Procedure: ESOPHAGOGASTRODUODENOSCOPY (EGD);  Surgeon: Jerene Bears, MD;  Location[facility] ENDOSCOPY;  Service: Endoscopy;  Laterality: N/A;  . FEMORAL-POPLITEAL BYPASS GRAFT Right 10/12/2013   Procedure:   Femoral-Peroneal trunk  bypass with nonreversed greater saphenous vein graft;  Surgeon: Elam Dutch, MD;  Location: [place];  Service: Vascular;  Laterality: Right;  . GIVENS CAPSULE STUDY N/A 01/20/2014   Procedure: GIVENS CAPSULE STUDY;  Surgeon: Jerene Bears, MD;  Location: [place] ENDOSCOPY;  Service: Gastroenterology;  Laterality: N/A;  . ICD GENERATOR CHANGEOUT N/A 03/04/2017   Procedure: ICD Generator Changeout;  Surgeon: [name], MD;  Location: [place] CV LAB;  Service: Cardiovascular;  Laterality: N/A;  . INSERT / REPLACE / REMOVE PACEMAKER    . INTRAOPERATIVE ARTERIOGRAM Right 10/12/2013   Procedure: INTRA OPERATIVE ARTERIOGRAM;  Surgeon: Elam Dutch, MD;  Location: [place];  Service: Vascular;  Laterality: Right;  . LEFT HEART CATH AND CORONARY ANGIOGRAPHY N/A 10/24/2019   Procedure: LEFT HEART CATH AND CORONARY ANGIOGRAPHY;  Surgeon: Belva Crome, MD;  Location: [place] CV LAB;  Service: Cardiovascular;  Laterality: N/A;  . ORIF ELBOW FRACTURE  08/16/2011   Procedure: OPEN REDUCTION INTERNAL FIXATION (ORIF) ELBOW/OLECRANON FRACTURE;  Surgeon: Schuyler Amor, MD;  Location: [facility];  Service: Orthopedics;  Laterality: Left;  . RENAL ANGIOGRAM N/A 08/12/2013   Procedure: RENAL ANGIOGRAM;  Surgeon: Elam Dutch, MD;  Location: [facility] CATH LAB;  Service: Cardiovascular;  Laterality: N/A;  . SUBCLAVIAN STENT PLACEMENT Left 07/31/2011   7x18 Genesis, balloon, with reduction of 90% ostial left subclavian artery stenosis to 0%  with residual excellent flow  . TONSILLECTOMY    . TUBAL LIGATION      Prior to Admission medications   Medication Sig Start Date End Date Taking? Authorizing Provider  albuterol (PROVENTIL) (2.5 MG/3ML) 0.083% nebulizer solution USE 1 VIAL IN NEBULIZER EVERY 4 HOURS AS NEEDED FOR WHEEZING FOR SHORTNESS OF BREATH Patient taking differently: Take 2.5 mg by nebulization every 4 (four) hours as needed for wheezing or shortness of breath.  05/18/19  Yes Laurey Morale, MD  allopurinol (ZYLOPRIM) 300 MG tablet Take 1 tablet by mouth once daily Patient taking differently: Take 300 mg by mouth daily.  11/22/19  Yes Laurey Morale, MD  ALPRAZolam Duanne Moron) 1 MG tablet TAKE 1 TABLET BY MOUTH IN THE MORNING AND 1 & 1/2 (ONE & ONE-HALF) NIGHTLY AS NEEDED FOR ANXIETY Patient taking differently: Take 1-1.5 mg by mouth See admin instructions. Take 1 tablet (1mg ) by mouth daily in the morning and 1 and 1/2 tablet (1.5mg ) by mough every night as needed for anxiety. 11/22/19  Yes Laurey Morale, MD  amiodarone (PACERONE) 200 MG tablet Take 1 tablet (200 mg total) by mouth 2 (two) times daily. 10/26/19  Yes Shirley Friar, PA-C  Ascorbic Acid (VITAMIN C PO) Take 1 tablet by mouth in the morning and at bedtime.   Yes [provider]  aspirin 81 MG chewable tablet Chew 1 tablet (81 mg total) by mouth daily. 05/23/16  Yes Reyne Dumas, MD  azelastine (ASTELIN) 0.1 % nasal spray USE 2 SPRAY(S) IN EACH NOSTRIL TWICE DAILY AS DIRECTED Patient taking differently: Place 2 sprays into both nostrils 2 (two) times daily.  08/08/19  Yes Laurey Morale, MD  bisoprolol (ZEBETA) 5 MG tablet Take 1 tablet (5 mg total) by mouth daily. 12/09/19  Yes Eugenie Filler, MD  Cholecalciferol (VITAMIN D) 50 MCG (2000 UT) CAPS Take 4,000 Units by mouth daily.    Yes [provider]  clopidogrel (PLAVIX) 75 MG tablet Take 1 tablet by mouth once daily Patient taking differently: Take 75 mg by mouth daily.  06/30/19  Yes  Laurey Morale, MD  Cyanocobalamin 5000 MCG TBDP Take 5,000 mcg by mouth daily.    Yes [provider]  DULoxetine (CYMBALTA) 60 MG capsule Take 1 capsule (60 mg total) by mouth 2 (two) times daily. 01/05/19  Yes Sarajane Jews,  Ishmael Holter, MD  loratadine (CLARITIN) 10 MG tablet Take 10 mg by mouth daily.   Yes [provider]  nitroGLYCERIN (NITROSTAT) 0.4 MG SL tablet Place 1 tablet (0.4 mg total) under the tongue every 5 (five) minutes as needed for chest pain. X 3 doses Patient taking differently: Place 0.4 mg under the tongue every 5 (five) minutes x 3 doses as needed for chest pain.  11/02/19  Yes Shirley Friar, PA-C  potassium chloride SA (KLOR-CON) 20 MEQ tablet Take 1 tablet (20 mEq total) by mouth daily. 01/16/20  Yes Fay Records, MD  PROAIR HFA 108 [phone] Base) MCG/ACT inhaler INHALE 2 PUFFS BY MOUTH EVERY 4 HOURS AS NEEDED FOR WHEEZING AND FOR SHORTNESS OF BREATH Patient taking differently: Inhale 2 puffs into the lungs every 4 (four) hours as needed for wheezing or shortness of breath.  12/02/19  Yes Laurey Morale, MD  torsemide (DEMADEX) 20 MG tablet Take 4 tablets (80 mg total) by mouth 2 (two) times daily. 12/21/19  Yes Laurey Morale, MD  [place] USE   TO CHECK GLUCOSE ONCE DAILY Patient taking differently: 1 each by Other route daily.  07/27/18   Laurey Morale, MD  ACCU-CHEK SMARTVIEW test strip USE  STRIP TO CHECK GLUCOSE ONCE DAILY Patient taking differently: 1 each by Other route daily.  06/15/19   Laurey Morale, MD  Magnesium Oxide 400 MG CAPS Take 1 capsule (400 mg total) by mouth daily. Patient not taking: Reported on 01/12/2020 07/15/19   Croitoru, Dani Gobble, MD    Scheduled Meds:  Infusions:  PRN Meds:    Allergies as of 01/18/2020 - Review Complete 01/18/2020  Allergen Reaction Noted  . Potassium-containing compounds Other (See Comments) 07/29/2013  . Neomycin-polymyxin b gu  05/27/2017  . Other Other (See Comments) 04/06/2014  .  Azithromycin Rash 07/14/2011  . Codeine Itching 02/25/2007  . Darvon Itching 07/22/2010  . Erythromycin Rash 07/22/2010  . Meloxicam Other (See Comments) 02/25/2007  . Norco [hydrocodone-acetaminophen] Itching 06/27/2015  . Penicillins Rash and Other (See Comments) 02/25/2007  . Propoxyphene n-acetaminophen Itching 02/25/2007  . Rofecoxib Other (See Comments) 02/25/2007  . Rosuvastatin Other (See Comments) 11/07/2015  . Statins Itching and Other (See Comments) 04/06/2014  . Sulfa antibiotics Rash 07/14/2011    Family History  Problem Relation Age of Onset  . CAD Father   . Heart disease Father   . Hyperlipidemia Father   . Heart disease Mother   . Deep vein thrombosis Son   . Hyperlipidemia Other   . Colon cancer Maternal Grandmother   . Cancer Sister        ovarian  . Diabetes Sister   . Heart disease Sister   . Anesthesia problems Neg Hx   . Hypotension Neg Hx   . Malignant hyperthermia Neg Hx   . Pseudochol deficiency Neg Hx     Social History   Socioeconomic History  . Marital status: Widowed    Spouse name: Not on file  . Number of children: 3  . Years of education: 75  . Highest education level: Not on file  Occupational History  . Occupation: Retired  Tobacco Use  . Smoking status: Former Smoker    Packs/day: 0.50    Years: 62.00    Pack years: 31.00    Types: Cigarettes    Quit date: 07/19/2017    Years since quitting: 2.5  . Smokeless tobacco: Never Used  Vaping Use  . Vaping Use:  Never used  Substance and Sexual Activity  . Alcohol use: No    Alcohol/week: 0.0 standard drinks  . Drug use: No  . Sexual activity: Never    Birth control/protection: Surgical  Other Topics Concern  . Not on file  Social History Narrative   Ok to share information with medical POA, Son Gabriel Carina   Right-handed   Caffeine: Pepsi   Social Determinants of Health   Financial Resource Strain: Low Risk   . Difficulty of Paying Living Expenses: Not very hard  Food  Insecurity:   . Worried About Charity fundraiser in the Last Year:   . Arboriculturist in the Last Year:   Transportation Needs: No Transportation Needs  . Lack of Transportation (Medical): No  . Lack of Transportation (Non-Medical): No  Physical Activity:   . Days of Exercise per Week:   . Minutes of Exercise per Session:   Stress:   . Feeling of Stress :   Social Connections: Moderately Isolated  . Frequency of Communication with Friends and Family: More than three times a week  . Frequency of Social Gatherings with Friends and Family: More than three times a week  . Attends Religious Services: Never  . Active Member of Clubs or Organizations: Yes  . Attends Archivist Meetings: Never  . Marital Status: Widowed  Intimate Partner Violence:   . Fear of Current or Ex-Partner:   . Emotionally Abused:   [facility] Kitchen Physically Abused:   . Sexually Abused:     REVIEW OF SYSTEMS: Constitutional: Weakness ENT:  No nose bleeds Pulm: Denies shortness of breath and cough CV:  No palpitations, no LE edema.  No angina GU:  No hematuria, no frequency GI: See HPI. Heme: Denies unusual or excessive bleeding or bruising. Transfusions: Received 2 PRBCs in 01/2014 Neuro:  No headaches, no peripheral tingling or numbness Derm:  No itching, no rash or sores.  Endocrine:  No sweats or chills.  No polyuria or dysuria Immunization:   Travel:  None beyond local counties in last few months.    PHYSICAL EXAM: Vital signs in last 24 hours: Vitals:   01/19/20 0900 01/19/20 1000  BP: (!) 150/67 (!) 144/54  Pulse: (!) 59 60  Resp: (!) 26 19  Temp:    SpO2: 96% 94%   Wt Readings from Last 3 Encounters:  01/15/20 81.1 kg  12/21/19 80.3 kg  12/12/19 81 kg   General: Somnolent, obese, pale, looks chronically ill.  [place].  Difficult to arouse Head: No signs of head trauma.  No asymmetry or facial swelling Eyes: No scleral icterus, no conjunctival pallor. Ears: No obvious hearing  deficit Nose: No discharge or congestion Mouth: Moist, clear, pink mucosa.  Tongue midline. Neck: No masses, no thyromegaly, no JVD Lungs: No labored breathing, no cough.  Lungs clear but diminished breath sounds bilaterally Heart: RRR.  No MRG.  S1, S2 present Abdomen: Obese, nontender, soft.  No HSM, masses, bruits, hernias.   Rectal: Empty rectal vault so not able to test for FOB. Musc/Skeltl: No joint redness or swelling or gross deformities Extremities: No CCE. Neurologic: Somnolent, difficult to arouse.  No tremors or involuntary movement.  Moves all 4 limbs, strength not tested. Skin: No rash, no sores, no telangiectasia.   Psych: Flat affect.  Paucity of speech  Intake/Output from previous day: No intake/output data recorded. Intake/Output this shift: No intake/output data recorded.  LAB RESULTS: Recent Labs    01/18/20 2003  WBC  7.0  HGB 15.9*  HCT 50.2*  PLT 259   BMET Lab Results  Component Value Date   NA 132 (L) 01/18/2020   NA 139 01/15/2020   NA 140 01/14/2020   K 4.2 01/18/2020   K 4.3 01/15/2020   K 4.0 01/14/2020   CL 92 (L) 01/18/2020   CL 94 (L) 01/15/2020   CL 95 (L) 01/14/2020   CO2 26 01/18/2020   CO2 34 (H) 01/15/2020   CO2 33 (H) 01/14/2020   GLUCOSE 143 (H) 01/18/2020   GLUCOSE 114 (H) 01/15/2020   GLUCOSE 107 (H) 01/14/2020   BUN 38 (H) 01/18/2020   BUN 30 (H) 01/15/2020   BUN 24 (H) 01/14/2020   CREATININE 1.94 (H) 01/18/2020   CREATININE 1.51 (H) 01/15/2020   CREATININE 1.36 (H) 01/14/2020   CALCIUM 9.3 01/18/2020   CALCIUM 9.8 01/15/2020   CALCIUM 9.9 01/14/2020   LFT No results for input(s): PROT, ALBUMIN, AST, ALT, ALKPHOS, BILITOT, BILIDIR, IBILI in the last 72 hours. PT/INR Lab Results  Component Value Date   INR 1.1 12/05/2019   INR 1.1 02/25/2017   INR 1.06 05/30/2016   Hepatitis Panel No results for input(s): HEPBSAG, HCVAB, HEPAIGM, HEPBIGM in the last 72 hours. C-Diff No components found for: CDIFF Lipase      Component Value Date/Time   LIPASE 29 10/22/2019 0325    Drugs of Abuse     Component Value Date/Time   LABOPIA POSITIVE (A) 05/14/2016 1208   COCAINSCRNUR NONE DETECTED 05/14/2016 1208   LABBENZ POSITIVE (A) 05/14/2016 1208   AMPHETMU NONE DETECTED 05/14/2016 1208   THCU NONE DETECTED 05/14/2016 1208   LABBARB NONE DETECTED 05/14/2016 1208     RADIOLOGY STUDIES: DG Chest 2 View  Result Date: 01/19/2020 CLINICAL DATA:  Fatigue.  History of CHF. EXAM: CHEST - 2 VIEW COMPARISON:  12/05/2019.  09/30/2019. FINDINGS: AICD in stable position. Again noted cardiomegaly with bilateral interstitial prominence suggesting CHF. Small bilateral pleural effusion may be present. Calcified left lung nodule consistent with granuloma again noted. No pneumothorax. Degenerative change thoracic spine. Lower thoracic spine compression fracture noted on today's exam. IMPRESSION: 1. AICD in stable position. Cardiomegaly with bilateral interstitial prominence again noted suggesting CHF. Similar findings noted on prior exam. Small bilateral pleural effusions may be present. 2. Lower thoracic vertebral body compression fracture may be present. Electronically Signed   By: Marcello Moores  Register   On: 01/19/2020 05:14    IMPRESSION:   *   Vague, unsubstatiated report of a dark stool.  Empty rectal vault on my DRE just now.  FOB status not yet established.    *   chronic Plavix, 81 ASA.  Last Plavix 8/3, not on hold but has not received this yet today  *   Covid 19 +. Weakness may be from this.  Not on Remdesivir or steroid.   *    CHF, multiple admissions for acute on chronic CHF.  CAD.  S/p ICD.    *   Hx precancerous colon polyps requiring EMR in 2014 and 2015, one TA w HGD in 04/2013.  Overdue for suggest fup colonoscopy in 04/2017.     PLAN:     *   Per Dr Ardis Hughs.   *   HH diet ok for now.    *   Messaged Dr Lorin Mercy and she d/cd the Plavix.      Azucena Freed  01/19/2020, 10:17 AM Phone [phone]     ________________________________________________________________________  Velora Heckler  GI MD note:  I personally reviewed the data and agree with the assessment and plan described above. COVID + and so I did not enter her room but I have reviewed her chart extensively and spoke with Judson Roch who did meet with her in person.  Her weakness is almost certainly related to her COVID +.  Her Hb is higher than it was a week ago (almost 16 now).  Really no overt bleeding by history.    No reason to hold her plavix.  Please call if she has overt signficant bleeding, otherwise she should be seen by GI PRN.  Please call or page with any further questions or concerns.   Owens Loffler, MD [facility] Pager [phone]

## 2020-01-20 ENCOUNTER — Encounter: Payer: Medicare Other | Admitting: Cardiovascular Disease

## 2020-01-20 DIAGNOSIS — S62102D Fracture of unspecified carpal bone, left wrist, subsequent encounter for fracture with routine healing: Secondary | ICD-10-CM

## 2020-01-20 DIAGNOSIS — E1121 Type 2 diabetes mellitus with diabetic nephropathy: Secondary | ICD-10-CM

## 2020-01-20 DIAGNOSIS — F418 Other specified anxiety disorders: Secondary | ICD-10-CM

## 2020-01-20 DIAGNOSIS — J1282 Pneumonia due to coronavirus disease 2019: Secondary | ICD-10-CM

## 2020-01-20 DIAGNOSIS — N1832 Chronic kidney disease, stage 3b: Secondary | ICD-10-CM

## 2020-01-20 DIAGNOSIS — J439 Emphysema, unspecified: Secondary | ICD-10-CM

## 2020-01-20 DIAGNOSIS — Z95810 Presence of automatic (implantable) cardiac defibrillator: Secondary | ICD-10-CM

## 2020-01-20 LAB — CBC WITH DIFFERENTIAL/PLATELET
Abs Immature Granulocytes: 0.06 10*3/uL (ref 0.00–0.07)
Basophils Absolute: 0 10*3/uL (ref 0.0–0.1)
Basophils Relative: 0 %
Eosinophils Absolute: 0 10*3/uL (ref 0.0–0.5)
Eosinophils Relative: 0 %
HCT: 48.6 % — ABNORMAL HIGH (ref 36.0–46.0)
Hemoglobin: 15.4 g/dL — ABNORMAL HIGH (ref 12.0–15.0)
Immature Granulocytes: 1 %
Lymphocytes Relative: 9 %
Lymphs Abs: 0.4 10*3/uL — ABNORMAL LOW (ref 0.7–4.0)
MCH: 31.2 pg (ref 26.0–34.0)
MCHC: 31.7 g/dL (ref 30.0–36.0)
MCV: 98.6 fL (ref 80.0–100.0)
Monocytes Absolute: 0.3 10*3/uL (ref 0.1–1.0)
Monocytes Relative: 7 %
Neutro Abs: 3.5 10*3/uL (ref 1.7–7.7)
Neutrophils Relative %: 83 %
Platelets: 244 10*3/uL (ref 150–400)
RBC: 4.93 MIL/uL (ref 3.87–5.11)
RDW: 15.1 % (ref 11.5–15.5)
WBC: 4.3 10*3/uL (ref 4.0–10.5)
nRBC: 0 % (ref 0.0–0.2)

## 2020-01-20 LAB — COMPREHENSIVE METABOLIC PANEL
ALT: 113 U/L — ABNORMAL HIGH (ref 0–44)
AST: 141 U/L — ABNORMAL HIGH (ref 15–41)
Albumin: 3.5 g/dL (ref 3.5–5.0)
Alkaline Phosphatase: 101 U/L (ref 38–126)
Anion gap: 16 — ABNORMAL HIGH (ref 5–15)
BUN: 42 mg/dL — ABNORMAL HIGH (ref 8–23)
CO2: 23 mmol/L (ref 22–32)
Calcium: 8.9 mg/dL (ref 8.9–10.3)
Chloride: 98 mmol/L (ref 98–111)
Creatinine, Ser: 1.8 mg/dL — ABNORMAL HIGH (ref 0.44–1.00)
GFR calc Af Amer: 30 mL/min — ABNORMAL LOW (ref >60)
GFR calc non Af Amer: 26 mL/min — ABNORMAL LOW (ref >60)
Glucose, Bld: 155 mg/dL — ABNORMAL HIGH (ref 70–99)
Potassium: 4.4 mmol/L (ref 3.5–5.1)
Sodium: 137 mmol/L (ref 135–145)
Total Bilirubin: 0.3 mg/dL (ref 0.3–1.2)
Total Protein: 6.3 g/dL — ABNORMAL LOW (ref 6.5–8.1)

## 2020-01-20 LAB — GLUCOSE, CAPILLARY
Glucose-Capillary: 156 mg/dL — ABNORMAL HIGH (ref 70–99)
Glucose-Capillary: 209 mg/dL — ABNORMAL HIGH (ref 70–99)
Glucose-Capillary: 189 mg/dL — ABNORMAL HIGH (ref 70–99)
Glucose-Capillary: 200 mg/dL — ABNORMAL HIGH (ref 70–99)
Glucose-Capillary: 218 mg/dL — ABNORMAL HIGH (ref 70–99)

## 2020-01-20 LAB — PHOSPHORUS: Phosphorus: 4.3 mg/dL (ref 2.5–4.6)

## 2020-01-20 LAB — MAGNESIUM: Magnesium: 1.9 mg/dL (ref 1.7–2.4)

## 2020-01-20 LAB — FERRITIN: Ferritin: 138 ng/mL (ref 11–307)

## 2020-01-20 LAB — D-DIMER, QUANTITATIVE: D-Dimer, Quant: 2.27 ug/mL-FEU — ABNORMAL HIGH (ref 0.00–0.50)

## 2020-01-20 LAB — C-REACTIVE PROTEIN: CRP: 1.2 mg/dL — ABNORMAL HIGH (ref <1.0)

## 2020-01-20 MED ORDER — DEXAMETHASONE SODIUM PHOSPHATE 10 MG/ML IJ SOLN
6.0000 mg | INTRAMUSCULAR | Status: DC
  Administered 2020-01-20 – 2020-01-23 (×4): 6 mg via INTRAVENOUS
  Filled 2020-01-20 (×4): qty 1

## 2020-01-20 NOTE — Progress Notes (Deleted)
Patient admitted for Covid-19, arrived into the unit at about 8:05pm on 3L oxygen.  Patient is alert and oriented x 3, able to answer questions appropriately. Skin  assessment done with second RN and documented in flowsheet. Vital signs are stab;e. Patient was situated and oriented to room. Will continue to monitor

## 2020-01-20 NOTE — Evaluation (Signed)
Physical Therapy Evaluation Patient Details Name: Sydney Phillips MRN: [id number] DOB: 03/26/1936 Today's Date: 01/20/2020   History of Present Illness  84 y.o. female with medical history significant of subclavian arterial stenosis s/p stent; pulmonary HTN; pacemaker placement; COPD on home O2; PAD; HTN; HLD; DM; depression/anxiety; dementia; stage 3 CKD; chronic pain; chronic combined CHF; and CAD s/p stents presenting with generalized weakness.  She was previously hospitalized from 7/29-8/1 after her AICD discharged, with acute on chronic combined CHF.  She has been too weak to get out of bed and was not wearing her continuous home O2.    Clinical Impression  Pt is quite unsteady on her feet, but I believe this is her baseline.  She is NWB L UE and will need a platform attachment/RW for home use.  She was able to walk around the room, but needs 2 L O2 [place] to maintain sats (see separate O2 sat note).  She would benefit from home therapy follow up if she can have it at discharge.   PT to follow acutely for deficits listed below.      Follow Up Recommendations Home health PT    Equipment Recommendations  Rolling walker with 5" wheels;Other (comment) (with L platform attachment)    Recommendations for Other Services       Precautions / Restrictions Precautions Precautions: Fall;Other (comment) Precaution Comments: NWB L UE, WB through elbow Required Braces or Orthoses: Splint/Cast Splint/Cast: L wrist immobilizer Restrictions LUE Weight Bearing: Non weight bearing (WB through elbow)      Mobility  Bed Mobility Overal bed mobility: Modified Independent Bed Mobility: Supine to Sit     Supine to sit: Modified independent (Device/Increase time);HOB elevated (HOB maximally elevated, used rail)        Transfers Overall transfer level: Needs assistance Equipment used: 1 person hand held assist Transfers: Sit to/from Stand Sit to Stand: Min assist         General transfer  comment: Min assist to power up and stabilize for balance once up  Ambulation/Gait Ambulation/Gait assistance: Min assist Gait Distance (Feet): 25 Feet Assistive device: 1 person hand held assist;Rolling walker (2 wheeled) Gait Pattern/deviations: Step-through pattern;Staggering left;Staggering right Gait velocity: decreased Gait velocity interpretation: 1.31 - 2.62 ft/sec, indicative of limited community ambulator General Gait Details: Walked to the door with one person hand held assist under left axilla, pt reaching for stability with R free hand.  Too dangerous to walk back without assistive device, so advised her to lightly put L hand on RW for balance only and I will bring her a platform attachment tomorrow.  Attempted gait on RA and her O2 sats dropped to 80% without DOE, returned to 90s once 2 L O2  donned.  no distress or audible SOB.   Stairs            Wheelchair Mobility    Modified Rankin (Stroke Patients Only)       Balance Overall balance assessment: Needs assistance Sitting-balance support: Feet supported;No upper extremity supported Sitting balance-Leahy Scale: Fair     Standing balance support: Single extremity supported Standing balance-Leahy Scale: Poor Standing balance comment: pt unable to stand without at least one hand on something.                              Pertinent Vitals/Pain Pain Assessment: No/denies pain    Home Living Family/patient expects to be discharged to:: Private residence Living Arrangements: Children (  son) Available Help at Discharge: Family;Available 24 hours/day (son is currently unemployed) Type of Home: House Home Access: Stairs to enter Entrance Stairs-Rails: Left Entrance Stairs-Number of Steps: 2 Home Layout: One level Home Equipment: Walker - 2 wheels;Cane - single point;Shower seat;Walker - 4 wheels;Other (comment) (home O2 at night) Additional Comments: reports wearing 3L O2 at night    Prior  Function Level of Independence: Needs assistance   Gait / Transfers Assistance Needed: uses walker at baseline  ADL's / Homemaking Assistance Needed: reports doing BADL at mod I level with DME and will cook clean, but at slow pace        Hand Dominance   Dominant Hand: Right    Extremity/Trunk Assessment   Upper Extremity Assessment Upper Extremity Assessment: Defer to OT evaluation    Lower Extremity Assessment Lower Extremity Assessment: Generalized weakness (per pt report if she walks too far her legs get crampy/pain)    Cervical / Trunk Assessment Cervical / Trunk Assessment: Kyphotic  Communication   Communication: HOH (R ear is better)  Cognition Arousal/Alertness: Awake/alert Behavior During Therapy: WFL for tasks assessed/performed Overall Cognitive Status: No family/caregiver present to determine baseline cognitive functioning                                 General Comments: Pt is likely close to baseline, likes things her way and her way only      General Comments      Exercises     Assessment/Plan    PT Assessment Patient needs continued PT services  PT Problem List Decreased strength;Decreased activity tolerance;Decreased balance;Decreased mobility;Decreased knowledge of use of DME;Decreased knowledge of precautions;Cardiopulmonary status limiting activity       PT Treatment Interventions DME instruction;Gait training;Stair training;Functional mobility training;Therapeutic activities;Therapeutic exercise;Balance training;Patient/family education    PT Goals (Current goals can be found in the Care Plan section)  Acute Rehab PT Goals Patient Stated Goal: to go home at d/c PT Goal Formulation: With patient Time For Goal Achievement: 02/03/20 Potential to Achieve Goals: Good    Frequency Min 3X/week   Barriers to discharge        Co-evaluation               AM-PAC PT "6 Clicks" Mobility  Outcome Measure Help needed  turning from your back to your side while in a flat bed without using bedrails?: A Little Help needed moving from lying on your back to sitting on the side of a flat bed without using bedrails?: A Little Help needed moving to and from a bed to a chair (including a wheelchair)?: A Little Help needed standing up from a chair using your arms (e.g., wheelchair or bedside chair)?: A Little Help needed to walk in hospital room?: A Little Help needed climbing 3-5 steps with a railing? : A Lot 6 Click Score: 17    End of Session Equipment Utilized During Treatment: Oxygen Activity Tolerance: Patient tolerated treatment well Patient left: in chair;with call bell/phone within reach;with chair alarm set Nurse Communication: Mobility status PT Visit Diagnosis: Muscle weakness (generalized) (M62.81);Difficulty in walking, not elsewhere classified (R26.2);History of falling (Z91.81)    Time: 7510-2585 PT Time Calculation (min) (ACUTE ONLY): 46 min   Charges:   PT Evaluation $PT Eval Moderate Complexity: 1 Mod PT Treatments $Gait Training: 8-22 mins $Therapeutic Activity: 8-22 mins   Verdene Lennert, PT, DPT  Acute Rehabilitation [phone] pager [phone]) [phone] office

## 2020-01-20 NOTE — Progress Notes (Signed)
Occupational Therapy Evaluation Patient Details Name: Sydney Phillips MRN: [id number] DOB: 16-Jan-1936 Today's Date: 01/20/2020    History of Present Illness 84 y.o. female with medical history significant of subclavian arterial stenosis s/p stent; pulmonary HTN; pacemaker placement; COPD on home O2; PAD; HTN; HLD; DM; depression/anxiety; dementia; stage 3 CKD; chronic pain; chronic combined CHF; and CAD s/p stents presenting with generalized weakness.  She was previously hospitalized from 7/29-8/1 after her AICD discharged, with acute on chronic combined CHF.  She has been too weak to get out of bed and was not wearing her continuous home O2.     Clinical Impression   Patient lives with son in single level home.  Was at hospital 7/29-8/1 and after returning home patient states she was not able to walk and son was not able to assist her as much as she needed.  Patient initially agreeable to session and participation.  Able to get to EOB with min guard and stand with min assist.  Patient unsteady for further walk with one hand assist.  Had patient sit EOB and then patient began refusing any further movement (see general comment for full encounter). Patient refusing to return to safe position and became agitated/ threatening toward therapist.  RN and NT aware and present for escalation.  Patient presenting with weakness, poor cognition, decreased activity tolerance and balance.  Patient unsafe to return home at this point, recommending SNF for further rehab. Will continue to follow with OT acutely to address the deficits listed below.      Follow Up Recommendations  SNF;Supervision/Assistance - 24 hour    Equipment Recommendations  Other (comment) (defer to next venuw)    Recommendations for Other Services       Precautions / Restrictions Precautions Precautions: Fall;ICD/Pacemaker;Other (comment) Precaution Comments: NWB L UE, WB through elbow Required Braces or Orthoses:  Splint/Cast Splint/Cast: L wrist immobilizer Restrictions Weight Bearing Restrictions: Yes LUE Weight Bearing: Weight bear through elbow only      Mobility Bed Mobility Overal bed mobility: Needs Assistance Bed Mobility: Supine to Sit     Supine to sit: Min guard     General bed mobility comments: increased time, effort  Transfers Overall transfer level: Needs assistance Equipment used: 1 person hand held assist Transfers: Sit to/from Stand Sit to Stand: Min assist         General transfer comment: Min assist to power up and steady    Balance Overall balance assessment: Needs assistance Sitting-balance support: Feet supported;Single extremity supported Sitting balance-Leahy Scale: Poor Sitting balance - Comments: Sit EOB with R arm on bed rail/propped on bed   Standing balance support: Single extremity supported Standing balance-Leahy Scale: Poor                             ADL either performed or assessed with clinical judgement   ADL Overall ADL's : Needs assistance/impaired Eating/Feeding: Set up;Sitting   Grooming: Set up;Sitting   Upper Body Bathing: Minimal assistance;Sitting   Lower Body Bathing: Maximal assistance;Sit to/from stand   Upper Body Dressing : Minimal assistance;Sitting   Lower Body Dressing: Maximal assistance;Sit to/from stand   Toilet Transfer: Moderate assistance;Stand-pivot                   Vision Baseline Vision/History: Wears glasses Wears Glasses: At all times Patient Visual Report: No change from baseline       Perception     Praxis  Pertinent Vitals/Pain       Hand Dominance Right   Extremity/Trunk Assessment Upper Extremity Assessment Upper Extremity Assessment: Generalized weakness;LUE deficits/detail LUE Deficits / Details: Casted, distal ulnar fx, NWB.  No edema noted, sensation intact, full elbow/finger ROM   Lower Extremity Assessment Lower Extremity Assessment: Defer to PT  evaluation       Communication Communication Communication: HOH (hears better on R ear, no augmented hearing devices with her)   Cognition Arousal/Alertness: Awake/alert Behavior During Therapy: Restless;Agitated;Impulsive Overall Cognitive Status: No family/caregiver present to determine baseline cognitive functioning Area of Impairment: Orientation;Safety/judgement;Problem solving;Following commands;Memory                 Orientation Level: Disoriented to;Time Current Attention Level: Sustained Memory: Decreased recall of precautions;Decreased short-term memory Following Commands: Follows one step commands inconsistently Safety/Judgement: Decreased awareness of safety;Decreased awareness of deficits   Problem Solving: Difficulty sequencing;Requires verbal cues General Comments: Initially able to answer questions appropriately, needs increased cueing for sequencing and safety.  Patient escalated throughout session, getting agitated, restless, and threatening toward therapist.  Saftey awareness very poor.  Refusing sitting in chair and refusing laying back in bed once at EOB.     General Comments  Patient initially a little agitated about breakfast being cold.  Allowed patient to finish breakfast while therapist asked about home set up/PLOF which patient was agreeable to.  Once attempting to initiate mobility, patient becoming agitated about cold drinks now getting warm.  Agreed to "hurry up and do whatever you want so you can get out of my face". Stood and returned to EOB.  Patient becoming very aggitated and yelling and threatening therapist after therapist insisted she return to laying or sitting in chair for safety.  Patient is a high fall risk.  Patient stated "if i could throw you down I would. I am going to sue you, you cannot make me do whatever you want. I will report you".  Patient confused and STM poor, recounting actions of start of session poorly.  Called in NT and RN for  assistance in attempting to deescalate.  Patient refusing laying back in bed after >20 min.  Patient stating if we touch her she will scream.  Emphasized this was all for her own safety. Patient left sittign EOB with call bell and instructions not to get up.  Bed alarm on, RN and tech aware.       Exercises     Shoulder Instructions      Home Living Family/patient expects to be discharged to:: Private residence Living Arrangements: Children (son) Available Help at Discharge: Family;Available 24 hours/day Type of Home: House Home Access: Stairs to enter [employer] of Steps: 2 Entrance Stairs-Rails: Left Home Layout: One level     Bathroom Shower/Tub: Teacher, early years/pre: Standard Bathroom Accessibility: No (rollator does not fit and she has to put it outside door the)   Home Equipment: Walker - 2 wheels;Cane - single point;Shower seat   Additional Comments: reports wearing 3L O2 at night      Prior Functioning/Environment Level of Independence: Needs assistance  Gait / Transfers Assistance Needed: uses walker at baseline ADL's / Homemaking Assistance Needed: reports doing BADL at mod I level with DME and will cook clean, but at slow pace   Comments: Uses 3L O2 at home but only at night. Between recent hospitalization and this visit patient states she was not able to walk, son could not help her get up  OT Problem List: Decreased strength;Decreased knowledge of use of DME or AE;Decreased knowledge of precautions;Decreased activity tolerance;Impaired UE functional use;Impaired balance (sitting and/or standing);Decreased safety awareness;Decreased cognition      OT Treatment/Interventions: Self-care/ADL training;Therapeutic exercise;Patient/family education;Balance training;Therapeutic activities;DME and/or AE instruction;Cognitive remediation/compensation    OT Goals(Current goals can be found in the care plan section) Acute Rehab OT  Goals Patient Stated Goal: to sit at EOB OT Goal Formulation: With patient Time For Goal Achievement: 02/03/20 Potential to Achieve Goals: Fair  OT Frequency: Min 2X/week   Barriers to D/C:            Co-evaluation              AM-PAC OT "6 Clicks" Daily Activity     Outcome Measure Help from another person eating meals?: A Little Help from another person taking care of personal grooming?: A Little Help from another person toileting, which includes using toliet, bedpan, or urinal?: A Lot Help from another person bathing (including washing, rinsing, drying)?: A Lot Help from another person to put on and taking off regular upper body clothing?: A Little Help from another person to put on and taking off regular lower body clothing?: A Lot 6 Click Score: 15   End of Session Equipment Utilized During Treatment: Gait belt Nurse Communication: Mobility status;Other (comment) (agitated state, where patient was left)  Activity Tolerance: Patient tolerated treatment well Patient left: in bed;with call bell/phone within reach;with bed alarm set;Other (comment) (sitting EOB - refusing to lay back)  OT Visit Diagnosis: Unsteadiness on feet (R26.81);Repeated falls (R29.6);History of falling (Z91.81);Muscle weakness (generalized) (M62.81);Other symptoms and signs involving cognitive function                Time: 2836-6294 OT Time Calculation (min): 45 min Charges:  OT General Charges $OT Visit: 1 Visit OT Evaluation $OT Eval Moderate Complexity: 1 Mod OT Treatments $Therapeutic Activity: 23-37 mins  August Luz, OTR/L   Phylliss Bob 01/20/2020, 9:40 AM

## 2020-01-20 NOTE — Progress Notes (Signed)
Patient admitted for Covid-19, arrived into the unit at about 8:05pm on 3L oxygen.  Patient is alert and oriented x 3, able to answer questions appropriately. Skin  assessment done with second RN and documented in flowsheet. Vital signs are stab;e. Patient was situated and oriented to room. Will continue to monitor

## 2020-01-20 NOTE — Progress Notes (Signed)
PROGRESS NOTE    Sydney Phillips  TXM:[id number] DOB: 1935-09-11 DOA: 01/18/2020 PCP: Laurey Morale, MD    Brief Narrative:  Patient admitted to the hospital working diagnosis of acute hypoxic respiratory failure due to SARS COVID-19 viral pneumonia.  84 year old female with past medical history for subclavian artery stenosis status post stent, pulmonary hypertension, systolic heart failure (status post AICD), COPD with chronic hypoxic respiratory failure, hypertension, dyslipidemia, type 2 diabetes mellitus, chronic kidney disease stage III, peripheral artery disease, depression, anxiety and dementia. Hospitalization 7/29-8/1 for AICD discharge related to decompensated heart failure. At home patient had became progressively weak, on the day of admission she was unable to get out of her bed.  Her pulse oximetry was 84%.  Initial physical examination blood pressure 129/48, heart rate 59, respiratory rate 22, oxygen saturation 93%, she had bilateral rhonchi, heart S1-S2, present rhythmic, abdomen soft, no lower extremity edema Sodium 132, potassium 4.2, chloride 92, bicarb 26, glucose 143, BUN 38, creatinine 1.94, white count 7.0, hemoglobin 15.9, hematocrit 50.2, platelets 259.  SARS COVID-19 positive. Chest radiograph with cardiomegaly, positive interstitial infiltrate left lower lobe. EKG 60 bpm, normal axis, prolonged QRS, a sensed, ventricular paced, no significant ST segment or T wave changes.   Assessment & Plan:   Principal Problem:   Pneumonia due to COVID-19 virus Active Problems:   Hyperlipidemia LDL goal <70   Depression with anxiety   Hypertension   COPD (chronic obstructive pulmonary disease) (HCC)   Implantable cardioverter-defibrillator (ICD) in situ   CKD (chronic kidney disease), stage III   Type 2 diabetes mellitus with diabetic chronic kidney disease (HCC)   Abnormal thyroid function test   Fracture of left wrist   COVID-19   1.  Acute on chronic hypoxic  respiratory failure due to SARS COVID-19 viral pneumonia  RR: 19  Pulse oxymetry: 92%  Fi02: 2L/ min per [place]  COVID-19 Labs  Recent Labs    01/19/20 0811 01/20/20 0319  DDIMER 2.28* 2.27*  FERRITIN 108 138  LDH 230*  --   CRP 1.2* 1.2*    Lab Results  Component Value Date   SARSCOV2NAA POSITIVE (A) 01/19/2020   [place] NEGATIVE 01/12/2020   [place] NEGATIVE 12/11/2019   [place] NEGATIVE 12/05/2019    Continue medical therapy with remdesivir #2/5 and systemic corticosteroids with dexamethasone.  On bronchodilator therapy, antitussive agents and airway clearing techniques with flutter valve and incentive spirometer.   Continue to encourage out of bed to chair, continue PT and OT evaluations.   2. Systolic heart failure EF  20 to 25%, sp AICD. No clinical signs of exacerbation, will continue medical therapy with amiodarone and bisoprolol. Diuresis with torsemide.   Continue close blood pressure monitoring.   3. CKD stage 3b. Her renal function is stable, serum cr 1,80 with K at 4,4 and serum bicarbonate at 23.  Continue close follow up on electrolytes.  Continue diuresis with torsemide.   4. CAD and PVD/ with melena. No chest or lower extremity pain. Will continue to hold on antiplatelet therapy for now, in the setting of melena.   5. Hypothyroid. Continue with levothyroxine  6. Controlled T2DM (Hgb A1c 6,7) with dyslipidemia. Continue glucose cover and monitoring with insulin sliding scale, patient is tolerating po well.   Patient with statin intolerance.   7. Dementia/ depression and anxiety. Recent L wrist fracture. Continue with Cymbalta and alprazolam. As needed analgesics (oxycodone) and PT/OT evaluation   Patient continue to be at high risk for worsening COVID  pneumonia.   Status is: Inpatient  Remains inpatient appropriate because:IV treatments appropriate due to intensity of illness or inability to take PO   Dispo: The patient is from: Home               Anticipated d/c is to: Home              Anticipated d/c date is: > 3 days              Patient currently is not medically stable to d/c.   DVT prophylaxis: scd   Code Status:    full  Family Communication:   I spoke over the phone with the patient's son about patient's  condition, plan of care, prognosis and all questions were addressed.    Consultants:   GI    Subjective: Patient with no significant dyspnea, no chest pain, no nausea or vomiting, continue to be very weak and deconditioned,   Objective: Vitals:   01/19/20 2028 01/20/20 0000 01/20/20 0400 01/20/20 0802  BP: 128/83 (!) 141/56 125/85 (!) 113/58  Pulse: 60 60 60 61  Resp: 16 (!) 24 18 19   Temp: (!) 97.4 F (36.3 C) 97.6 F (36.4 C) 97.7 F (36.5 C) 97.8 F (36.6 C)  TempSrc: Oral Oral Axillary Axillary  SpO2: 93% 91% 95% 92%    Intake/Output Summary (Last 24 hours) at 01/20/2020 0854 Last data filed at 01/19/2020 2200 Gross per 24 hour  Intake 530 ml  Output --  Net 530 ml   There were no vitals filed for this visit.  Examination:   General: weak and deconditioned  Neurology: Awake and alert, non focal  E ENT: mild pallor, no icterus, oral mucosa moist Cardiovascular: No JVD. S1-S2 present, rhythmic, no gallops, rubs, or murmurs. No lower extremity edema. Pulmonary: positive breath sounds bilaterally,  Gastrointestinal. Abdomen soft and non tender Skin. No rashes Musculoskeletal: no joint deformities     Data Reviewed: I have personally reviewed following labs and imaging studies  CBC: Recent Labs  Lab 01/18/20 2003 01/20/20 0319  WBC 7.0 4.3  NEUTROABS  --  3.5  HGB 15.9* 15.4*  HCT 50.2* 48.6*  MCV 99.6 98.6  PLT 259 798   Basic Metabolic Panel: Recent Labs  Lab 01/14/20 0231 01/15/20 0319 01/18/20 2003 01/20/20 0319  NA 140 139 132* 137  K 4.0 4.3 4.2 4.4  CL 95* 94* 92* 98  CO2 33* 34* 26 23  GLUCOSE 107* 114* 143* 155*  BUN 24* 30* 38* 42*  CREATININE 1.36*  1.51* 1.94* 1.80*  CALCIUM 9.9 9.8 9.3 8.9  MG  --   --   --  1.9  PHOS  --   --   --  4.3   GFR: Estimated Creatinine Clearance: 25.8 mL/min (A) (by C-G formula based on SCr of 1.8 mg/dL (H)). Liver Function Tests: Recent Labs  Lab 01/20/20 0319  AST 141*  ALT 113*  ALKPHOS 101  BILITOT 0.3  PROT 6.3*  ALBUMIN 3.5   No results for input(s): LIPASE, AMYLASE in the last 168 hours. No results for input(s): AMMONIA in the last 168 hours. Coagulation Profile: No results for input(s): INR, PROTIME in the last 168 hours. Cardiac Enzymes: No results for input(s): CKTOTAL, CKMB, CKMBINDEX, TROPONINI in the last 168 hours. BNP (last 3 results) No results for input(s): PROBNP in the last 8760 hours. HbA1C: No results for input(s): HGBA1C in the last 72 hours. CBG: Recent Labs  Lab 01/18/20 1915 01/19/20 2031  01/19/20 2252 01/20/20 0811  GLUCAP 107* 197* 181* 156*   Lipid Profile: Recent Labs    01/19/20 0811  TRIG 143   Thyroid Function Tests: No results for input(s): TSH, T4TOTAL, FREET4, T3FREE, THYROIDAB in the last 72 hours. Anemia Panel: Recent Labs    01/19/20 0811 01/20/20 0319  FERRITIN 108 138      Radiology Studies: I have reviewed all of the imaging during this hospital visit personally     Scheduled Meds: . allopurinol  300 mg Oral Daily  . amiodarone  200 mg Oral BID  . aspirin  81 mg Oral Daily  . azelastine  2 spray Each Nare BID  . bisoprolol  5 mg Oral Daily  . DULoxetine  60 mg Oral BID  . insulin aspart  0-15 Units Subcutaneous TID WC  . insulin aspart  0-5 Units Subcutaneous QHS  . loratadine  10 mg Oral Daily  . methylPREDNISolone (SOLU-MEDROL) injection  40 mg Intravenous Q12H  . sodium chloride flush  3 mL Intravenous Q12H  . torsemide  80 mg Oral BID   Continuous Infusions: . sodium chloride    . remdesivir 100 mg in NS 100 mL       LOS: 1 day        Dewain Platz Gerome Apley, MD

## 2020-01-20 NOTE — Progress Notes (Deleted)
Patient admitted for Covid-19. Arrived into the unit at about 8:05pm on 3L oxygen.  Patient is alert and oriented, able to answer questions appropriately, Skin assessment done with second RN documented in flowsheet. vital signs stable. Patient was situated and oriented to the room. Will continue to monitor.

## 2020-01-21 LAB — C-REACTIVE PROTEIN: CRP: 1.2 mg/dL — ABNORMAL HIGH (ref <1.0)

## 2020-01-21 LAB — COMPREHENSIVE METABOLIC PANEL
ALT: 109 U/L — ABNORMAL HIGH (ref 0–44)
AST: 108 U/L — ABNORMAL HIGH (ref 15–41)
Albumin: 3.6 g/dL (ref 3.5–5.0)
Alkaline Phosphatase: 102 U/L (ref 38–126)
Anion gap: 11 (ref 5–15)
BUN: 56 mg/dL — ABNORMAL HIGH (ref 8–23)
CO2: 29 mmol/L (ref 22–32)
Calcium: 8.9 mg/dL (ref 8.9–10.3)
Chloride: 95 mmol/L — ABNORMAL LOW (ref 98–111)
Creatinine, Ser: 1.95 mg/dL — ABNORMAL HIGH (ref 0.44–1.00)
GFR calc Af Amer: 27 mL/min — ABNORMAL LOW (ref >60)
GFR calc non Af Amer: 24 mL/min — ABNORMAL LOW (ref >60)
Glucose, Bld: 114 mg/dL — ABNORMAL HIGH (ref 70–99)
Potassium: 3.9 mmol/L (ref 3.5–5.1)
Sodium: 135 mmol/L (ref 135–145)
Total Bilirubin: 0.6 mg/dL (ref 0.3–1.2)
Total Protein: 6.6 g/dL (ref 6.5–8.1)

## 2020-01-21 LAB — FERRITIN: Ferritin: 138 ng/mL (ref 11–307)

## 2020-01-21 LAB — GLUCOSE, CAPILLARY
Glucose-Capillary: 136 mg/dL — ABNORMAL HIGH (ref 70–99)
Glucose-Capillary: 182 mg/dL — ABNORMAL HIGH (ref 70–99)
Glucose-Capillary: 168 mg/dL — ABNORMAL HIGH (ref 70–99)
Glucose-Capillary: 217 mg/dL — ABNORMAL HIGH (ref 70–99)

## 2020-01-21 LAB — D-DIMER, QUANTITATIVE: D-Dimer, Quant: 1.85 ug/mL-FEU — ABNORMAL HIGH (ref 0.00–0.50)

## 2020-01-21 MED ORDER — CLOPIDOGREL BISULFATE 75 MG PO TABS
75.0000 mg | ORAL_TABLET | Freq: Every day | ORAL | Status: DC
  Administered 2020-01-21 – 2020-01-23 (×3): 75 mg via ORAL
  Filled 2020-01-21 (×3): qty 1

## 2020-01-21 MED ORDER — PANTOPRAZOLE SODIUM 40 MG PO TBEC
40.0000 mg | DELAYED_RELEASE_TABLET | Freq: Every day | ORAL | Status: DC
  Administered 2020-01-21 – 2020-01-22 (×2): 40 mg via ORAL
  Filled 2020-01-21 (×2): qty 1

## 2020-01-21 MED ORDER — TORSEMIDE 20 MG PO TABS
80.0000 mg | ORAL_TABLET | Freq: Two times a day (BID) | ORAL | Status: DC
  Administered 2020-01-22: 80 mg via ORAL
  Filled 2020-01-21: qty 4

## 2020-01-21 NOTE — Progress Notes (Signed)
PROGRESS NOTE                                                                                                                                                                                                             Patient Demographics:    Sydney Phillips, is a 84 y.o. female, DOB - 12-14-1935, YDX:[id number]  Outpatient Primary MD for the patient is Laurey Morale, MD   Admit date - 01/18/2020   LOS - 2  Chief Complaint  Patient presents with  . Weakness       Brief Narrative: Patient is a 84 y.o. female with PMHx of chronic systolic heart failure s/p CRT, ventricular tachycardia, COPD on home O2-mostly at night, HTN, DM-2, CKD stage IIIb-presented with progressive weakness-found to be hypoxic secondary to COVID-19 pneumonia.  See below for further details.  Significant Events: 8/4>> Admit to [facility] for weakness-found to be hypoxia from COVID-19 pneumonia 7/29-8/1>> admit to [facility] by cardiology for decompensated heart failure  Significant studies: 01/19/2020>>Chest x-ray: Cardiomegaly with bilateral interstitial prominence. 08/24/2019>> echo: EF 20-25%  COVID-19 medications: Steroids: 8/5>> Remdesivir: 8/5>>  Antibiotics: None  Microbiology data: 8/5 >>blood culture: No growth  Procedures: None  Consults: GI  DVT prophylaxis: Place and maintain sequential compression device Start: 01/19/20 1433     Subjective:    [name] today remains stable-on 1-2 L of oxygen.  No chest pain-comfortable at rest.  No overt melena or hematochezia   Assessment  & Plan :   Acute on chronic hypoxic hypoxic Resp Failure due to Covid 19 Viral pneumonia: Improving-continue steroids/remdesivir.  Inflammatory markers are only minimally elevated.  Her volume status appears stable.  She is on nocturnal home O2-2 L.  Fever: afebrile O2 requirements:  SpO2: 96 % O2 Flow Rate (L/min): 2 L/min   COVID-19 Labs: Recent  Labs    01/19/20 0811 01/20/20 0319 01/21/20 0430  DDIMER 2.28* 2.27* 1.85*  FERRITIN 108 138 138  LDH 230*  --   --   CRP 1.2* 1.2* 1.2*       Component Value Date/Time   BNP 1,385.6 (H) 12/05/2019 1259   BNP 540.2 (H) 08/07/2016 1433    Recent Labs  Lab 01/19/20 0811  PROCALCITON <0.10    Lab Results  Component Value Date   SARSCOV2NAA POSITIVE (A) 01/19/2020   [place] NEGATIVE 01/12/2020  [place] NEGATIVE 12/11/2019   [place] NEGATIVE 12/05/2019     Prone/Incentive Spirometry: encouraged incentive spirometry use 3-4/hour.  Transaminitis: Appears to be mild-likely secondary to COVID-19-watch closely while on Remdesivir.  Chronic systolic heart failure: Volume status is stable-continue Demadex.  CKD stage IIIb: Slight bump in creatinine overnight but not far from usual baseline-follow closely for now.  Questionable episode of melena: Doubt GI bleeding-appreciate GI input-continue aspirin-resume Plavix-continue PPI.  Hemoglobin has been stable since admission.  CAD: No anginal symptoms-continue aspirin/Plavix/beta-blocker follow.  Last [facility] May 2021 showed stable anatomy.  PAD: History of subclavian stenting in 2013-stable.  History of ventricular tachycardia-s/p ICD shocks prompting her last hospitalization: Continue telemetry monitoring-remains on amiodarone.  Follow magnesium and potassium.  DM-2 (A1c 6.7 on 7/31): CBG stable with SSI.  Recent Labs    01/20/20 2043 01/21/20 0810 01/21/20 1225  GLUCAP 218* 136* 182*   Hypothyroidism: Continue Synthroid  Depression/anxiety: Appears stable-continue Cymbalta and Xanax.  Nondisplaced left wrist fracture: Continue with splint-WABT through elbow.  Follow-up with Dr. Jeannie Fend in 2 weeks.  Was evaluated by orthopedics on 7/29.  Debility/deconditioning: Secondary to acute illness-evaluated by PT with recommendations for home health on discharge.  ABG:    Component Value Date/Time   PHART 7.432  12/05/2019 1343   PCO2ART 53.9 (H) 12/05/2019 1343   PO2ART 94 12/05/2019 1343   HCO3 36.0 (H) 12/05/2019 1343   TCO2 38 (H) 12/05/2019 1343   ACIDBASEDEF 6.0 (H) 04/06/2016 1242   O2SAT 97.0 12/05/2019 1343    Vent Settings: N/A    Condition - Stable  Family Communication  :  Son updated over the phone 8/7  Code Status :  Full Code  Diet :  Diet Order            Diet heart healthy/carb modified Room service appropriate? No; Fluid consistency: Thin  Diet effective now                  Disposition Plan  :   Status is: Inpatient  Remains inpatient appropriate because:Inpatient level of care appropriate due to severity of illness   Dispo: The patient is from: Home              Anticipated d/c is to: Home              Anticipated d/c date is: 2 days              Patient currently is not medically stable to d/c.   Barriers to discharge: Hypoxia requiring O2 supplementation/complete 5 days of IV Remdesivir  Antimicorbials  :    Anti-infectives (From admission, onward)   Start     Dose/Rate Route Frequency Ordered Stop   01/20/20 1000  remdesivir 100 mg in sodium chloride 0.9 % 100 mL IVPB     Discontinue    "Followed by" Linked Group Details   100 mg 200 mL/hr over 30 Minutes Intravenous Daily 01/19/20 1059 01/24/20 0959   01/19/20 1200  remdesivir 200 mg in sodium chloride 0.9% 250 mL IVPB       "Followed by" Linked Group Details   200 mg 580 mL/hr over 30 Minutes Intravenous Once 01/19/20 1059 01/19/20 1228      Inpatient Medications  Scheduled Meds: . allopurinol  300 mg Oral Daily  . amiodarone  200 mg Oral BID  . aspirin  81 mg Oral Daily  . azelastine  2 spray Each Nare BID  . bisoprolol  5 mg Oral Daily  .  dexamethasone (DECADRON) injection  6 mg Intravenous Q24H  . DULoxetine  60 mg Oral BID  . insulin aspart  0-15 Units Subcutaneous TID WC  . insulin aspart  0-5 Units Subcutaneous QHS  . loratadine  10 mg Oral Daily  . sodium chloride flush  3  mL Intravenous Q12H  . torsemide  80 mg Oral BID   Continuous Infusions: . sodium chloride    . remdesivir 100 mg in NS 100 mL 100 mg (01/21/20 1134)   PRN Meds:.sodium chloride, acetaminophen, albuterol, ALPRAZolam, bisacodyl, guaiFENesin-dextromethorphan, ondansetron **OR** ondansetron (ZOFRAN) IV, oxyCODONE, polyethylene glycol, sodium chloride flush, sodium phosphate   Time Spent in minutes  25   See all Orders from today for further details   Oren Binet M.D on 01/21/2020 at 1:08 PM  To page go to [email or web address] - use universal password  [facility] -  Office  [phone]    Objective:   Vitals:   01/20/20 0802 01/20/20 1433 01/20/20 2041 01/21/20 0537  BP: (!) 113/58 122/61 (!) 106/55 (!) 143/68  Pulse: 61 60 60 60  Resp: 19 18 19  (!) 22  Temp: 97.8 F (36.6 C) 98 F (36.7 C) 97.9 F (36.6 C) 97.6 F (36.4 C)  TempSrc: Axillary Oral Oral Oral  SpO2: 92% 92% 93% 96%    Wt Readings from Last 3 Encounters:  01/15/20 81.1 kg  12/21/19 80.3 kg  12/12/19 81 kg     Intake/Output Summary (Last 24 hours) at 01/21/2020 1308 Last data filed at 01/20/2020 1731 Gross per 24 hour  Intake --  Output 600 ml  Net -600 ml     Physical Exam Gen Exam:Alert awake-not in any distress HEENT:atraumatic, normocephalic Chest: B/L clear to auscultation anteriorly CVS:S1S2 regular Abdomen:soft non tender, non distended Extremities:no edema Neurology: Non focal Skin: no rash   Data Review:    CBC Recent Labs  Lab 01/18/20 2003 01/20/20 0319  WBC 7.0 4.3  HGB 15.9* 15.4*  HCT 50.2* 48.6*  PLT 259 244  MCV 99.6 98.6  MCH 31.5 31.2  MCHC 31.7 31.7  RDW 15.4 15.1  LYMPHSABS  --  0.4*  MONOABS  --  0.3  EOSABS  --  0.0  BASOSABS  --  0.0    Chemistries  Recent Labs  Lab 01/15/20 0319 01/18/20 2003 01/20/20 0319 01/21/20 0430  NA 139 132* 137 135  K 4.3 4.2 4.4 3.9  CL 94* 92* 98 95*  CO2 34* 26 23 29   GLUCOSE 114* 143* 155* 114*  BUN 30* 38*  42* 56*  CREATININE 1.51* 1.94* 1.80* 1.95*  CALCIUM 9.8 9.3 8.9 8.9  MG  --   --  1.9  --   AST  --   --  141* 108*  ALT  --   --  113* 109*  ALKPHOS  --   --  101 102  BILITOT  --   --  0.3 0.6   ------------------------------------------------------------------------------------------------------------------ Recent Labs    01/19/20 0811  TRIG 143    Lab Results  Component Value Date   HGBA1C 6.7 (H) 01/14/2020   ------------------------------------------------------------------------------------------------------------------ No results for input(s): TSH, T4TOTAL, T3FREE, THYROIDAB in the last 72 hours.  Invalid input(s): FREET3 ------------------------------------------------------------------------------------------------------------------ Recent Labs    01/20/20 0319 01/21/20 0430  FERRITIN 138 138    Coagulation profile No results for input(s): INR, PROTIME in the last 168 hours.  Recent Labs    01/20/20 0319 01/21/20 0430  DDIMER 2.27* 1.85*    Cardiac Enzymes No results  for input(s): CKMB, TROPONINI, MYOGLOBIN in the last 168 hours.  Invalid input(s): CK ------------------------------------------------------------------------------------------------------------------    Component Value Date/Time   BNP 1,385.6 (H) 12/05/2019 1259   BNP 540.2 (H) 08/07/2016 1433    Micro Results Recent Results (from the past 240 hour(s))  SARS Coronavirus 2 by RT PCR (hospital order, performed in [facility] hospital lab) Nasopharyngeal Nasopharyngeal Swab     Status: None   Collection Time: 01/12/20  8:00 AM   Specimen: Nasopharyngeal Swab  Result Value Ref Range Status   SARS Coronavirus 2 NEGATIVE NEGATIVE Final    Comment: (NOTE) SARS-CoV-2 target nucleic acids are NOT DETECTED.  The SARS-CoV-2 RNA is generally detectable in upper and lower respiratory specimens during the acute phase of infection. The lowest concentration of SARS-CoV-2 viral copies this  assay can detect is 250 copies / mL. A negative result does not preclude SARS-CoV-2 infection and should not be used as the sole basis for treatment or other patient management decisions.  A negative result may occur with improper specimen collection / handling, submission of specimen other than nasopharyngeal swab, presence of viral mutation(s) within the areas targeted by this assay, and inadequate number of viral copies (<250 copies / mL). A negative result must be combined with clinical observations, patient history, and epidemiological information.  Fact Sheet for Patients:   StrictlyIdeas.no  Fact Sheet for Healthcare Providers: BankingDealers.co.za  This test is not yet approved or  cleared by the [place] FDA and has been authorized for detection and/or diagnosis of SARS-CoV-2 by FDA under an Emergency Use Authorization (EUA).  This EUA will remain in effect (meaning this test can be used) for the duration of the COVID-19 declaration under Section 564(b)(1) of the Act, 21 U.S.C. section 360bbb-3(b)(1), unless the authorization is terminated or revoked sooner.  Performed at [facility], [place] [address] [place], [place] [address]   SARS Coronavirus 2 by RT PCR (hospital order, performed in [facility] hospital lab) Nasopharyngeal Nasopharyngeal Swab     Status: Abnormal   Collection Time: 01/19/20  5:24 AM   Specimen: Nasopharyngeal Swab  Result Value Ref Range Status   SARS Coronavirus 2 POSITIVE (A) NEGATIVE Final    Comment: RESULT CALLED TO, READ BACK BY AND VERIFIED WITH: RN C HARRIS [id number] AT 1 AM BY CM (NOTE) SARS-CoV-2 target nucleic acids are DETECTED  SARS-CoV-2 RNA is generally detectable in upper respiratory specimens  during the acute phase of infection.  Positive results are indicative  of the presence of the identified virus, but do not rule out bacterial infection or co-infection with other  pathogens not detected by the test.  Clinical correlation with patient history and  other diagnostic information is necessary to determine patient infection status.  The expected result is negative.  Fact Sheet for Patients:   StrictlyIdeas.no   Fact Sheet for Healthcare Providers:   BankingDealers.co.za    This test is not yet approved or cleared by the [place] FDA and  has been authorized for detection and/or diagnosis of SARS-CoV-2 by FDA under an Emergency Use Authorization (EUA).  This EUA will remain in effect (meaning this te st can be used) for the duration of  the COVID-19 declaration under Section 564(b)(1) of the Act, 21 U.S.C. section 360-bbb-3(b)(1), unless the authorization is terminated or revoked sooner.  Performed at [facility], [place] [address] [place], [place] [address]   Blood Culture (routine x 2)     Status: None (Preliminary result)  Collection Time: 01/19/20  8:00 AM   Specimen: BLOOD RIGHT HAND  Result Value Ref Range Status   Specimen Description BLOOD RIGHT HAND  Final   Special Requests   Final    BOTTLES DRAWN AEROBIC AND ANAEROBIC Blood Culture results may not be optimal due to an inadequate volume of blood received in culture bottles   Culture   Final    NO GROWTH 2 DAYS Performed at [facility], [place] [address] [place], [place] [address]    Report Status PENDING  Incomplete  Blood Culture (routine x 2)     Status: None (Preliminary result)   Collection Time: 01/19/20  8:11 AM   Specimen: BLOOD RIGHT HAND  Result Value Ref Range Status   Specimen Description BLOOD RIGHT HAND  Final   Special Requests   Final    BOTTLES DRAWN AEROBIC ONLY Blood Culture results may not be optimal due to an inadequate volume of blood received in culture bottles   Culture   Final    NO GROWTH 2 DAYS Performed at [facility], [place] [address], [place], [place] [address]    Report Status  PENDING  Incomplete    Radiology Reports DG Chest 2 View  Result Date: 01/19/2020 CLINICAL DATA:  Fatigue.  History of CHF. EXAM: CHEST - 2 VIEW COMPARISON:  12/05/2019.  09/30/2019. FINDINGS: AICD in stable position. Again noted cardiomegaly with bilateral interstitial prominence suggesting CHF. Small bilateral pleural effusion may be present. Calcified left lung nodule consistent with granuloma again noted. No pneumothorax. Degenerative change thoracic spine. Lower thoracic spine compression fracture noted on today's exam. IMPRESSION: 1. AICD in stable position. Cardiomegaly with bilateral interstitial prominence again noted suggesting CHF. Similar findings noted on prior exam. Small bilateral pleural effusions may be present. 2. Lower thoracic vertebral body compression fracture may be present. Electronically Signed   By: Marcello Moores  Register   On: 01/19/2020 05:14   DG Chest 2 View  Result Date: 01/12/2020 CLINICAL DATA:  Chest pain. EXAM: CHEST - 2 VIEW COMPARISON:  12/05/2019. FINDINGS: AICD in stable position. Again noted cardiomegaly with pulmonary venous congestion bilateral interstitial prominence and small bilateral pleural effusions. Findings consistent CHF. Similar findings noted on prior exam. Calcified pulmonary nodules noted consistent prior granulomas disease. No pneumothorax. IMPRESSION: AICD in stable position. Cardiomegaly with pulmonary venous congestion, bilateral interstitial prominence, and small bilateral pleural effusions again noted. Findings again consistent with CHF. Similar findings noted on prior exam. Electronically Signed   By: Marcello Moores  Register   On: 01/12/2020 05:20   DG Elbow Complete Left  Result Date: 12/27/2019 CLINICAL DATA:  Left elbow pain for 3 days since a fall. EXAM: LEFT ELBOW - COMPLETE 3+ VIEW COMPARISON:  Plain films left elbow 08/08/2011. FINDINGS: Healed olecranon fracture is identified with 2 K-wires and a figure of 8 tension band. No joint effusion. Mild  degenerative change about the articulation of the ulna and trochlea noted. IMPRESSION: No acute abnormality. Healed olecranon fracture with fixation hardware in place. Electronically Signed   By: Inge Rise M.D.   On: 12/27/2019 09:28   DG Wrist Complete Left  Result Date: 01/12/2020 CLINICAL DATA:  Persistent left wrist pain after fall 1 month ago EXAM: LEFT WRIST - COMPLETE 3+ VIEW COMPARISON:  12/26/2019 FINDINGS: No definite acute fracture is identified. Possible subtle horizontal lucency at the distal ulna could reflect a subacute nondisplaced fracture. No definite fracture line is seen in at the radial styloid. Mildly widened scapholunate interval of  3 mm. Similar degree of osteoarthritis, most pronounced at the first [facility] and triscaphe joints. Bones are demineralized. Soft tissue swelling persists. IMPRESSION: 1. Possible subtle subacute nondisplaced fracture at the distal ulna. Correlate with point tenderness. 2. No definite acute or healing distal radial fracture is identified. 3. Mildly widened scapholunate interval of 3 mm suggesting underlying ligamentous injury. Electronically Signed   By: Davina Poke D.O.   On: 01/12/2020 08:47   DG Wrist Complete Left  Result Date: 12/27/2019 CLINICAL DATA:  Left wrist pain for 3 days since a fall. Initial encounter. EXAM: LEFT WRIST - COMPLETE 3+ VIEW COMPARISON:  None. FINDINGS: Soft tissues about the wrist are swollen. A subtle cortical step-off is seen on the posterior aspect of the distal radius on the lateral view and there is subtle lucency through the base of the radial styloid. Scattered osteoarthritis is worst at the first [facility] joint. IMPRESSION: Marked soft tissue swelling about the wrist with a likely nondisplaced fracture through the base of the radial styloid. Osteoarthritis. Electronically Signed   By: Inge Rise M.D.   On: 12/27/2019 09:27

## 2020-01-22 LAB — FERRITIN: Ferritin: 102 ng/mL (ref 11–307)

## 2020-01-22 LAB — COMPREHENSIVE METABOLIC PANEL
ALT: 94 U/L — ABNORMAL HIGH (ref 0–44)
AST: 68 U/L — ABNORMAL HIGH (ref 15–41)
Albumin: 3.3 g/dL — ABNORMAL LOW (ref 3.5–5.0)
Alkaline Phosphatase: 93 U/L (ref 38–126)
Anion gap: 13 (ref 5–15)
BUN: 61 mg/dL — ABNORMAL HIGH (ref 8–23)
CO2: 26 mmol/L (ref 22–32)
Calcium: 8.5 mg/dL — ABNORMAL LOW (ref 8.9–10.3)
Chloride: 96 mmol/L — ABNORMAL LOW (ref 98–111)
Creatinine, Ser: 1.8 mg/dL — ABNORMAL HIGH (ref 0.44–1.00)
GFR calc Af Amer: 30 mL/min — ABNORMAL LOW (ref >60)
GFR calc non Af Amer: 26 mL/min — ABNORMAL LOW (ref >60)
Glucose, Bld: 163 mg/dL — ABNORMAL HIGH (ref 70–99)
Potassium: 3.7 mmol/L (ref 3.5–5.1)
Sodium: 135 mmol/L (ref 135–145)
Total Bilirubin: 0.3 mg/dL (ref 0.3–1.2)
Total Protein: 6.1 g/dL — ABNORMAL LOW (ref 6.5–8.1)

## 2020-01-22 LAB — GLUCOSE, CAPILLARY
Glucose-Capillary: 137 mg/dL — ABNORMAL HIGH (ref 70–99)
Glucose-Capillary: 204 mg/dL — ABNORMAL HIGH (ref 70–99)
Glucose-Capillary: 125 mg/dL — ABNORMAL HIGH (ref 70–99)
Glucose-Capillary: 213 mg/dL — ABNORMAL HIGH (ref 70–99)

## 2020-01-22 LAB — MAGNESIUM: Magnesium: 1.9 mg/dL (ref 1.7–2.4)

## 2020-01-22 LAB — C-REACTIVE PROTEIN: CRP: 1.4 mg/dL — ABNORMAL HIGH (ref <1.0)

## 2020-01-22 LAB — D-DIMER, QUANTITATIVE: D-Dimer, Quant: 1.7 ug/mL-FEU — ABNORMAL HIGH (ref 0.00–0.50)

## 2020-01-22 MED ORDER — TORSEMIDE 20 MG PO TABS
80.0000 mg | ORAL_TABLET | Freq: Two times a day (BID) | ORAL | Status: DC
  Administered 2020-01-23: 80 mg via ORAL
  Filled 2020-01-22: qty 4

## 2020-01-22 MED ORDER — MAGNESIUM OXIDE 400 (241.3 MG) MG PO TABS
400.0000 mg | ORAL_TABLET | Freq: Every day | ORAL | Status: DC
  Administered 2020-01-22 – 2020-01-23 (×2): 400 mg via ORAL
  Filled 2020-01-22 (×2): qty 1

## 2020-01-22 MED ORDER — MAGNESIUM SULFATE 2 GM/50ML IV SOLN
2.0000 g | Freq: Once | INTRAVENOUS | Status: AC
  Administered 2020-01-22: 2 g via INTRAVENOUS
  Filled 2020-01-22: qty 50

## 2020-01-22 MED ORDER — POTASSIUM CHLORIDE CRYS ER 20 MEQ PO TBCR
20.0000 meq | EXTENDED_RELEASE_TABLET | Freq: Every day | ORAL | Status: DC
  Administered 2020-01-23: 20 meq via ORAL
  Filled 2020-01-22: qty 1

## 2020-01-22 MED ORDER — POTASSIUM CHLORIDE CRYS ER 20 MEQ PO TBCR
40.0000 meq | EXTENDED_RELEASE_TABLET | Freq: Once | ORAL | Status: AC
  Administered 2020-01-22: 40 meq via ORAL
  Filled 2020-01-22: qty 2

## 2020-01-22 NOTE — Progress Notes (Signed)
PROGRESS NOTE                                                                                                                                                                                                             Patient Demographics:    Sydney Phillips, is a 84 y.o. female, DOB - 1936-02-09, UKG:[id number]  Outpatient Primary MD for the patient is Laurey Morale, MD   Admit date - 01/18/2020   LOS - 3  Chief Complaint  Patient presents with   Weakness       Brief Narrative: Patient is a 84 y.o. female with PMHx of chronic systolic heart failure s/p CRT, ventricular tachycardia, COPD on home O2-mostly at night, HTN, DM-2, CKD stage IIIb-presented with progressive weakness-found to be hypoxic secondary to COVID-19 pneumonia.  See below for further details.  Significant Events: 8/4>> Admit to [facility] for weakness-found to be hypoxia from COVID-19 pneumonia 7/29-8/1>> admit to [facility] by cardiology for decompensated heart failure  Significant studies: 01/19/2020>>Chest x-ray: Cardiomegaly with bilateral interstitial prominence. 08/24/2019>> echo: EF 20-25%  COVID-19 medications: Steroids: 8/5>> Remdesivir: 8/5>>  Antibiotics: None  Microbiology data: 8/5 >>blood culture: No growth  Procedures: None  Consults: GI  DVT prophylaxis: Place and maintain sequential compression device Start: 01/19/20 1433     Subjective:    Sydney Phillips this morning was lying comfortably in bed-she had a good night sleep-she denies any major complaints-denies any worsening of her baseline shortness of breath.   Assessment  & Plan :   Acute on chronic hypoxic hypoxic Resp Failure (on home O2 2 L nocturnally) due to Covid 19 Viral pneumonia: Continues to slowly improve-inflammatory markers are downtrending-none 2 L of oxygen this morning.  Volume status is stable without any signs of volume overload.  Plans are to continue  steroids/remdesivir-suspect if clinical improvement continues-she should be stable for discharge on 8/9.    Fever: afebrile O2 requirements:  SpO2: 91 % O2 Flow Rate (L/min): 2 L/min   COVID-19 Labs: Recent Labs    01/20/20 0319 01/21/20 0430 01/22/20 0425  DDIMER 2.27* 1.85* 1.70*  FERRITIN 138 138 102  CRP 1.2* 1.2* 1.4*       Component Value Date/Time   BNP 1,385.6 (H) 12/05/2019 1259   BNP 540.2 (H) 08/07/2016 1433    Recent Labs  Lab 01/19/20 7628  PROCALCITON <0.10    Lab Results  Component Value Date   SARSCOV2NAA POSITIVE (A) 01/19/2020   [place] NEGATIVE 01/12/2020   [place] NEGATIVE 12/11/2019   [place] NEGATIVE 12/05/2019     Prone/Incentive Spirometry: encouraged incentive spirometry use 3-4/hour.  Transaminitis: Appears to be mild-likely secondary to COVID-19-watch closely while on Remdesivir.  Chronic systolic heart failure: Volume status is stable-continue Demadex.  CKD stage IIIb: Creatinine not very far from baseline-follow periodically.  Questionable episode of melena: Doubt GI bleeding-appreciate GI input-on aspirin/Plavix without any evidence of melena/hematochezia.  Hemoglobin has been stable.  Continue PPI.    CAD: No anginal symptoms-continue aspirin/Plavix/beta-blocker follow.  Last [facility] May 2021 showed stable anatomy.  PAD: History of subclavian stenting in 2013-stable.  History of ventricular tachycardia-s/p ICD shocks prompting her last hospitalization: Continue telemetry monitoring-remains on amiodarone.  Follow magnesium and potassium.  DM-2 (A1c 6.7 on 7/31): CBG stable with SSI.  Recent Labs    01/21/20 1656 01/21/20 2122 01/22/20 0755  GLUCAP 168* 217* 137*   Hypothyroidism: Continue Synthroid  Depression/anxiety: Appears stable-continue Cymbalta and Xanax.  Nondisplaced left wrist fracture: Continue with splint-WABT through elbow.  Follow-up with Dr. Jeannie Fend in 2 weeks.  Was evaluated by orthopedics on  7/29.  Debility/deconditioning: Secondary to acute illness-evaluated by PT with recommendations for home health on discharge.  ABG:    Component Value Date/Time   PHART 7.432 12/05/2019 1343   PCO2ART 53.9 (H) 12/05/2019 1343   PO2ART 94 12/05/2019 1343   HCO3 36.0 (H) 12/05/2019 1343   TCO2 38 (H) 12/05/2019 1343   ACIDBASEDEF 6.0 (H) 04/06/2016 1242   O2SAT 97.0 12/05/2019 1343    Vent Settings: N/A    Condition - Stable  Family Communication  :  Son updated over the phone 8/7  Code Status :  Full Code  Diet :  Diet Order            Diet heart healthy/carb modified Room service appropriate? No; Fluid consistency: Thin  Diet effective now                  Disposition Plan  :   Status is: Inpatient  Remains inpatient appropriate because:Inpatient level of care appropriate due to severity of illness   Dispo: The patient is from: Home              Anticipated d/c is to: Home              Anticipated d/c date is: 1 days-[place] 8/9              Patient currently is not medically stable to d/c.   Barriers to discharge: Hypoxia requiring O2 supplementation/complete 5 days of IV Remdesivir  Antimicorbials  :    Anti-infectives (From admission, onward)   Start     Dose/Rate Route Frequency Ordered Stop   01/20/20 1000  remdesivir 100 mg in sodium chloride 0.9 % 100 mL IVPB     Discontinue    "Followed by" Linked Group Details   100 mg 200 mL/hr over 30 Minutes Intravenous Daily 01/19/20 1059 01/24/20 0959   01/19/20 1200  remdesivir 200 mg in sodium chloride 0.9% 250 mL IVPB       "Followed by" Linked Group Details   200 mg 580 mL/hr over 30 Minutes Intravenous Once 01/19/20 1059 01/19/20 1228      Inpatient Medications  Scheduled Meds:  allopurinol  300 mg Oral Daily   amiodarone  200 mg Oral BID  aspirin  81 mg Oral Daily   azelastine  2 spray Each Nare BID   bisoprolol  5 mg Oral Daily   clopidogrel  75 mg Oral Daily   dexamethasone  (DECADRON) injection  6 mg Intravenous Q24H   DULoxetine  60 mg Oral BID   insulin aspart  0-15 Units Subcutaneous TID WC   insulin aspart  0-5 Units Subcutaneous QHS   loratadine  10 mg Oral Daily   magnesium oxide  400 mg Oral Daily   pantoprazole  40 mg Oral Q1200   [START ON 01/23/2020] potassium chloride  20 mEq Oral Daily   sodium chloride flush  3 mL Intravenous Q12H   torsemide  80 mg Oral BID   Continuous Infusions:  sodium chloride     remdesivir 100 mg in NS 100 mL 100 mg (01/22/20 0940)   PRN Meds:.sodium chloride, acetaminophen, albuterol, ALPRAZolam, bisacodyl, guaiFENesin-dextromethorphan, ondansetron **OR** ondansetron (ZOFRAN) IV, oxyCODONE, polyethylene glycol, sodium chloride flush, sodium phosphate   Time Spent in minutes  25   See all Orders from today for further details   Oren Binet M.D on 01/22/2020 at 10:40 AM  To page go to [email or web address] - use universal password  [facility] -  Office  [phone]    Objective:   Vitals:   01/21/20 1343 01/21/20 2118 01/22/20 0454 01/22/20 0500  BP: 138/73 127/66 139/61   Pulse: (!) 59 60 60   Resp: 20 20 20    Temp: 98 F (36.7 C) 98.1 F (36.7 C) 97.7 F (36.5 C)   TempSrc: Oral Oral Oral   SpO2: 94% 92% 91%   Weight:    81 kg    Wt Readings from Last 3 Encounters:  01/22/20 81 kg  01/15/20 81.1 kg  12/21/19 80.3 kg     Intake/Output Summary (Last 24 hours) at 01/22/2020 1040 Last data filed at 01/22/2020 0911 Gross per 24 hour  Intake 920 ml  Output 1950 ml  Net -1030 ml     Physical Exam Gen Exam:Alert awake-not in any distress HEENT:atraumatic, normocephalic Chest: B/L clear to auscultation anteriorly CVS:S1S2 regular Abdomen:soft non tender, non distended Extremities:no edema Neurology: Non focal Skin: no rash   Data Review:    CBC Recent Labs  Lab 01/18/20 2003 01/20/20 0319  WBC 7.0 4.3  HGB 15.9* 15.4*  HCT 50.2* 48.6*  PLT 259 244  MCV 99.6 98.6    MCH 31.5 31.2  MCHC 31.7 31.7  RDW 15.4 15.1  LYMPHSABS  --  0.4*  MONOABS  --  0.3  EOSABS  --  0.0  BASOSABS  --  0.0    Chemistries  Recent Labs  Lab 01/18/20 2003 01/20/20 0319 01/21/20 0430 01/22/20 0425  NA 132* 137 135 135  K 4.2 4.4 3.9 3.7  CL 92* 98 95* 96*  CO2 26 23 29 26   GLUCOSE 143* 155* 114* 163*  BUN 38* 42* 56* 61*  CREATININE 1.94* 1.80* 1.95* 1.80*  CALCIUM 9.3 8.9 8.9 8.5*  MG  --  1.9  --  1.9  AST  --  141* 108* 68*  ALT  --  113* 109* 94*  ALKPHOS  --  101 102 93  BILITOT  --  0.3 0.6 0.3   ------------------------------------------------------------------------------------------------------------------ No results for input(s): CHOL, HDL, LDLCALC, TRIG, CHOLHDL, LDLDIRECT in the last 72 hours.  Lab Results  Component Value Date   HGBA1C 6.7 (H) 01/14/2020   ------------------------------------------------------------------------------------------------------------------ No results for input(s): TSH, T4TOTAL, T3FREE, THYROIDAB in the  last 72 hours.  Invalid input(s): FREET3 ------------------------------------------------------------------------------------------------------------------ Recent Labs    01/21/20 0430 01/22/20 0425  FERRITIN 138 102    Coagulation profile No results for input(s): INR, PROTIME in the last 168 hours.  Recent Labs    01/21/20 0430 01/22/20 0425  DDIMER 1.85* 1.70*    Cardiac Enzymes No results for input(s): CKMB, TROPONINI, MYOGLOBIN in the last 168 hours.  Invalid input(s): CK ------------------------------------------------------------------------------------------------------------------    Component Value Date/Time   BNP 1,385.6 (H) 12/05/2019 1259   BNP 540.2 (H) 08/07/2016 1433    Micro Results Recent Results (from the past 240 hour(s))  SARS Coronavirus 2 by RT PCR (hospital order, performed in [facility] hospital lab) Nasopharyngeal Nasopharyngeal Swab     Status: Abnormal    Collection Time: 01/19/20  5:24 AM   Specimen: Nasopharyngeal Swab  Result Value Ref Range Status   SARS Coronavirus 2 POSITIVE (A) NEGATIVE Final    Comment: RESULT CALLED TO, READ BACK BY AND VERIFIED WITH: RN C HARRIS [id number] AT 14 AM BY CM (NOTE) SARS-CoV-2 target nucleic acids are DETECTED  SARS-CoV-2 RNA is generally detectable in upper respiratory specimens  during the acute phase of infection.  Positive results are indicative  of the presence of the identified virus, but do not rule out bacterial infection or co-infection with other pathogens not detected by the test.  Clinical correlation with patient history and  other diagnostic information is necessary to determine patient infection status.  The expected result is negative.  Fact Sheet for Patients:   StrictlyIdeas.no   Fact Sheet for Healthcare Providers:   BankingDealers.co.za    This test is not yet approved or cleared by the [place] FDA and  has been authorized for detection and/or diagnosis of SARS-CoV-2 by FDA under an Emergency Use Authorization (EUA).  This EUA will remain in effect (meaning this te st can be used) for the duration of  the COVID-19 declaration under Section 564(b)(1) of the Act, 21 U.S.C. section 360-bbb-3(b)(1), unless the authorization is terminated or revoked sooner.  Performed at [facility], [place] [address], [place], [place] [address]   Blood Culture (routine x 2)     Status: None (Preliminary result)   Collection Time: 01/19/20  8:00 AM   Specimen: BLOOD RIGHT HAND  Result Value Ref Range Status   Specimen Description BLOOD RIGHT HAND  Final   Special Requests   Final    BOTTLES DRAWN AEROBIC AND ANAEROBIC Blood Culture results may not be optimal due to an inadequate volume of blood received in culture bottles   Culture   Final    NO GROWTH 3 DAYS Performed at [facility], [place] [address], [place], [place] [address]      Report Status PENDING  Incomplete  Blood Culture (routine x 2)     Status: None (Preliminary result)   Collection Time: 01/19/20  8:11 AM   Specimen: BLOOD RIGHT HAND  Result Value Ref Range Status   Specimen Description BLOOD RIGHT HAND  Final   Special Requests   Final    BOTTLES DRAWN AEROBIC ONLY Blood Culture results may not be optimal due to an inadequate volume of blood received in culture bottles   Culture   Final    NO GROWTH 3 DAYS Performed at [facility], [place] [address], [place], [place] [address]    Report Status PENDING  Incomplete    Radiology Reports DG Chest 2 View  Result Date: 01/19/2020  CLINICAL DATA:  Fatigue.  History of CHF. EXAM: CHEST - 2 VIEW COMPARISON:  12/05/2019.  09/30/2019. FINDINGS: AICD in stable position. Again noted cardiomegaly with bilateral interstitial prominence suggesting CHF. Small bilateral pleural effusion may be present. Calcified left lung nodule consistent with granuloma again noted. No pneumothorax. Degenerative change thoracic spine. Lower thoracic spine compression fracture noted on today's exam. IMPRESSION: 1. AICD in stable position. Cardiomegaly with bilateral interstitial prominence again noted suggesting CHF. Similar findings noted on prior exam. Small bilateral pleural effusions may be present. 2. Lower thoracic vertebral body compression fracture may be present. Electronically Signed   By: Marcello Moores  Register   On: 01/19/2020 05:14   DG Chest 2 View  Result Date: 01/12/2020 CLINICAL DATA:  Chest pain. EXAM: CHEST - 2 VIEW COMPARISON:  12/05/2019. FINDINGS: AICD in stable position. Again noted cardiomegaly with pulmonary venous congestion bilateral interstitial prominence and small bilateral pleural effusions. Findings consistent CHF. Similar findings noted on prior exam. Calcified pulmonary nodules noted consistent prior granulomas disease. No pneumothorax. IMPRESSION: AICD in stable position. Cardiomegaly with pulmonary venous  congestion, bilateral interstitial prominence, and small bilateral pleural effusions again noted. Findings again consistent with CHF. Similar findings noted on prior exam. Electronically Signed   By: Marcello Moores  Register   On: 01/12/2020 05:20   DG Elbow Complete Left  Result Date: 12/27/2019 CLINICAL DATA:  Left elbow pain for 3 days since a fall. EXAM: LEFT ELBOW - COMPLETE 3+ VIEW COMPARISON:  Plain films left elbow 08/08/2011. FINDINGS: Healed olecranon fracture is identified with 2 K-wires and a figure of 8 tension band. No joint effusion. Mild degenerative change about the articulation of the ulna and trochlea noted. IMPRESSION: No acute abnormality. Healed olecranon fracture with fixation hardware in place. Electronically Signed   By: Inge Rise M.D.   On: 12/27/2019 09:28   DG Wrist Complete Left  Result Date: 01/12/2020 CLINICAL DATA:  Persistent left wrist pain after fall 1 month ago EXAM: LEFT WRIST - COMPLETE 3+ VIEW COMPARISON:  12/26/2019 FINDINGS: No definite acute fracture is identified. Possible subtle horizontal lucency at the distal ulna could reflect a subacute nondisplaced fracture. No definite fracture line is seen in at the radial styloid. Mildly widened scapholunate interval of 3 mm. Similar degree of osteoarthritis, most pronounced at the first [facility] and triscaphe joints. Bones are demineralized. Soft tissue swelling persists. IMPRESSION: 1. Possible subtle subacute nondisplaced fracture at the distal ulna. Correlate with point tenderness. 2. No definite acute or healing distal radial fracture is identified. 3. Mildly widened scapholunate interval of 3 mm suggesting underlying ligamentous injury. Electronically Signed   By: Davina Poke D.O.   On: 01/12/2020 08:47   DG Wrist Complete Left  Result Date: 12/27/2019 CLINICAL DATA:  Left wrist pain for 3 days since a fall. Initial encounter. EXAM: LEFT WRIST - COMPLETE 3+ VIEW COMPARISON:  None. FINDINGS: Soft tissues about the  wrist are swollen. A subtle cortical step-off is seen on the posterior aspect of the distal radius on the lateral view and there is subtle lucency through the base of the radial styloid. Scattered osteoarthritis is worst at the first [facility] joint. IMPRESSION: Marked soft tissue swelling about the wrist with a likely nondisplaced fracture through the base of the radial styloid. Osteoarthritis. Electronically Signed   By: Inge Rise M.D.   On: 12/27/2019 09:27

## 2020-01-22 NOTE — TOC Initial Note (Signed)
Transition of Care [facility]) - Initial/Assessment Note    Patient Details  Name: Sydney Phillips MRN: [id number] Date of Birth: Nov 07, 1935  Transition of Care [facility]) CM/SW Contact:    Pollie Friar, RN Phone Number: 01/22/2020, 2:18 PM  Clinical Narrative:                 Pt with orders for [facility] services. She was reluctant but agreeable to [facility]. She asked that they only come when scheduled and CM passed this on to the [facility] agency that was arranged: [facility].  Pt to have platform walker delivered to her room.  Pt states her son [name] can provide most of the needed supervision and other family can fill in where needed.  Home oxygen is through [place] per pt. Need to make sure transportation home brings her a tank.  Pt will have transport home when medically ready.   Expected Discharge Plan: [place] Barriers to Discharge: Continued Medical Work up   Patient Goals and CMS Choice   CMS Medicare.gov Compare Post Acute Care list provided to:: Patient Choice offered to / list presented to : Patient  Expected Discharge Plan and Services Expected Discharge Plan: [place]   Discharge Planning Services: CM Consult Post Acute Care Choice: Durable Medical Equipment, Home Health Living arrangements for the past 2 months: Single Family Home                 DME Arranged: Gilford Rile rolling, Geophysicist/field seismologist DME Agency: [employer] Date DME Agency Contacted: 01/22/20   Representative spoke with at DME Agency: [place]: PT, OT [place] Agency: [place] Date [place]: 01/22/20   Representative spoke with at [place]: Tommi Rumps  Prior Living Arrangements/Services Living arrangements for the past 2 months: [place] Lives with:: Adult Children (son: [name]) Patient language and need for interpreter reviewed:: Yes Do you feel safe going back to the place where you live?: Yes      Need for Family Participation in Patient Care: Yes  (Comment) Care giver support system in place?: Yes (comment)   Criminal Activity/Legal Involvement Pertinent to Current Situation/Hospitalization: No - Comment as needed  Activities of Daily Living      Permission Sought/Granted                  Emotional Assessment   Attitude/Demeanor/Rapport: Engaged Affect (typically observed): Accepting Orientation: : Oriented to Self, Oriented to Place, Oriented to  Time, Oriented to Situation   Psych Involvement: No (comment)  Admission diagnosis:  COVID-19 [U07.1] Patient Active Problem List   Diagnosis Date Noted  . Pneumonia due to COVID-19 virus 01/20/2020  . COVID-19 01/19/2020  . Abnormal thyroid function test 01/15/2020  . Fracture of left wrist 01/15/2020  . ICD (implantable cardioverter-defibrillator) discharge 01/12/2020  . Hyperlipemia   . Fibromyalgia   . Anxiety   . Acute on chronic congestive heart failure ([place]) 12/05/2019  . V-tach ([place]) 10/22/2019  . Type 2 diabetes mellitus with diabetic chronic kidney disease ([place]) 01/05/2019  . Diabetic foot ulcers ([place]) 06/18/2018  . Rhinitis, chronic 09/29/2017  . Hearing loss 09/04/2017  . Perforation of left tympanic membrane 09/04/2017  . Acute exacerbation of CHF (congestive heart failure) ([place]) 07/19/2017  . Acute on chronic combined systolic and diastolic CHF (congestive heart failure) ([place]) 07/19/2017  . Acute on chronic respiratory failure with hypoxia and hypercapnia ([place]) 07/19/2017  . Poor dentition 04/01/2017  . Tobacco abuse  12/17/2016  . 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 Chronic respiratory failure with hypoxia ([place])   . Acute encephalopathy 05/30/2016  . CKD (chronic kidney disease), stage III 05/30/2016  . Leukocytosis   . Stenosis of left subclavian artery ([place]) 05/19/2016  . Somnolence   . OSA and COPD overlap syndrome ([place])   . Cardiomyopathy, ischemic   . Ischemic mitral valve regurgitation   . Pulmonary hypertension ([place])    . CAD S/P percutaneous coronary angioplasty   . Altered mental state 02/26/2016  . Arthralgia 10/12/2015  . Left carotid stenosis 11/08/2014  . Iron deficiency anemia 04/17/2014  . Carotid artery disease ([place]) 04/17/2014  . LBBB (left bundle branch block) 04/16/2014  . Diastolic dysfunction 16/94/5038  . NSTEMI (non-ST elevated myocardial infarction) ([place]) 01/20/2014  . Melena 01/20/2014  . Microcytic anemia 01/19/2014  . Colon polyp 03/24/2013  . S/P angioplasty with stent, lt. subclavian 07/31/11 08/01/2011  . Implantable cardioverter-defibrillator (ICD) in situ 07/28/2011  . PVD, 07/28/2011  . Vertigo 07/27/2011  . Hypokalemia 07/27/2011  . SPINAL STENOSIS 01/28/2010  . Chronic constipation 01/14/2010  . Insomnia 01/23/2009  . HIP PAIN, BILATERAL 06/21/2008  . Coronary atherosclerosis 03/30/2008  . Other primary cardiomyopathies 03/30/2008  . Myalgia and myositis 11/26/2007  . Hyperlipidemia LDL goal <70 07/28/2007  . WEIGHT GAIN 06/04/2007  . COPD (chronic obstructive pulmonary disease) ([place]) 03/02/2007  . Depression with anxiety 02/25/2007  . Hypertension 02/25/2007  . GERD 02/25/2007  . COLONIC POLYPS, HX OF 02/25/2007   PCP:  Laurey Morale, MD Pharmacy:   [facility], [place] - [place] [address] Phone: [phone] Fax: [phone]     Social Determinants of Health (SDOH) Interventions    Readmission Risk Interventions Readmission Risk Prevention Plan 12/08/2019 12/08/2019  Transportation Screening Complete Complete  PCP or Specialist Appt within 3-5 Days Complete -  HRI or Home Care Consult Complete -  Social Work Consult for [place] Planning/Counseling Complete Complete  Palliative Care Screening Not Applicable Not Applicable  Medication Review Press photographer) Complete Complete  Some recent data might be hidden

## 2020-01-23 ENCOUNTER — Telehealth: Payer: Self-pay | Admitting: Family Medicine

## 2020-01-23 LAB — COMPREHENSIVE METABOLIC PANEL
ALT: 76 U/L — ABNORMAL HIGH (ref 0–44)
AST: 49 U/L — ABNORMAL HIGH (ref 15–41)
Albumin: 3.2 g/dL — ABNORMAL LOW (ref 3.5–5.0)
Alkaline Phosphatase: 81 U/L (ref 38–126)
Anion gap: 8 (ref 5–15)
BUN: 57 mg/dL — ABNORMAL HIGH (ref 8–23)
CO2: 30 mmol/L (ref 22–32)
Calcium: 8.5 mg/dL — ABNORMAL LOW (ref 8.9–10.3)
Chloride: 100 mmol/L (ref 98–111)
Creatinine, Ser: 1.94 mg/dL — ABNORMAL HIGH (ref 0.44–1.00)
GFR calc Af Amer: 27 mL/min — ABNORMAL LOW (ref >60)
GFR calc non Af Amer: 24 mL/min — ABNORMAL LOW (ref >60)
Glucose, Bld: 98 mg/dL (ref 70–99)
Potassium: 4 mmol/L (ref 3.5–5.1)
Sodium: 138 mmol/L (ref 135–145)
Total Bilirubin: 0.7 mg/dL (ref 0.3–1.2)
Total Protein: 5.9 g/dL — ABNORMAL LOW (ref 6.5–8.1)

## 2020-01-23 LAB — FERRITIN: Ferritin: 94 ng/mL (ref 11–307)

## 2020-01-23 LAB — GLUCOSE, CAPILLARY
Glucose-Capillary: 95 mg/dL (ref 70–99)
Glucose-Capillary: 145 mg/dL — ABNORMAL HIGH (ref 70–99)

## 2020-01-23 LAB — C-REACTIVE PROTEIN: CRP: 0.9 mg/dL (ref <1.0)

## 2020-01-23 LAB — D-DIMER, QUANTITATIVE: D-Dimer, Quant: 1.47 ug/mL-FEU — ABNORMAL HIGH (ref 0.00–0.50)

## 2020-01-23 MED ORDER — MAGNESIUM OXIDE 400 (241.3 MG) MG PO TABS: 400.0000 mg | ORAL_TABLET | Freq: Every day | ORAL | 0 refills | Status: AC

## 2020-01-23 MED ORDER — DEXAMETHASONE 6 MG PO TABS
6.0000 mg | ORAL_TABLET | Freq: Every day | ORAL | 0 refills | Status: DC
Start: 2020-01-23 — End: 2020-02-10

## 2020-01-23 MED ORDER — PANTOPRAZOLE SODIUM 40 MG PO TBEC: 40.0000 mg | DELAYED_RELEASE_TABLET | Freq: Every day | ORAL | 0 refills | Status: DC

## 2020-01-23 NOTE — Consult Note (Signed)
   [facility] Inpatient Consult   01/23/2020  Sydney Phillips [birth year] [id number] [place] Organization [ACO] Patient:  Medicare  Patient evaluated for community based chronic complex disease management services with [place] [facility].   Primary Care Provider:  Alysia Penna, MD office does the transition of care follow up.   Patient will receive post hospital discharge call and will be evaluate for complex disease management.      Of note, [facility] Care Management services does not replace or interfere with any services that are arranged by inpatient case management or social work.  For additional questions or referrals please contact:    Natividad Brood, RN BSN [place] [facility] Liaison  [phone] business mobile phone Toll free office [phone]  Fax number: [phone] [email or web address]

## 2020-01-23 NOTE — Discharge Summary (Signed)
PATIENT DETAILS Name: Sydney Phillips Age: 84 y.o. Sex: female Date of Birth: [birth year] MRN: [id number]. Admitting Physician: Karmen Bongo, MD PCP:Fry, Ishmael Holter, MD  Admit Date: 01/18/2020 Discharge date: 01/23/2020  Recommendations for Outpatient Follow-up:  1. Follow up with PCP in 1-2 weeks 2. Please obtain CMP/CBC in one week 3. Repeat Chest Xray in 4-6 week 4. Please ensure follow-up with cardiology  Admitted From:  Home  Disposition: Home with home health services   Home Health:  Yes  Equipment/Devices: Continue home O2 2 L at all times until seen by PCP.  Discharge Condition: Stable  CODE STATUS: FULL CODE  Diet recommendation:  Diet Order            Diet - low sodium heart healthy           Diet Carb Modified           Diet heart healthy/carb modified Room service appropriate? No; Fluid consistency: Thin  Diet effective now                  Brief Narrative: Patient is a 84 y.o. female with PMHx of chronic systolic heart failure s/p CRT, ventricular tachycardia, COPD on home O2-mostly at night, HTN, DM-2, CKD stage IIIb-presented with progressive weakness-found to be hypoxic secondary to COVID-19 pneumonia.  See below for further details.  Significant Events: 8/4>> Admit to [facility] for weakness-found to be hypoxia from COVID-19 pneumonia 7/29-8/1>> admit to [facility] by cardiology for decompensated heart failure  Significant studies: 01/19/2020>>Chest x-ray: Cardiomegaly with bilateral interstitial prominence. 08/24/2019>> echo: EF 20-25%  COVID-19 medications: Steroids: 8/5>> Remdesivir: 8/5>>8/9  Antibiotics: None  Microbiology data: 8/5 >>blood culture: No growth  Procedures: None  Consults: GI  Brief Hospital Course: Acute on chronic hypoxic hypoxic Resp Failure (on home O2 2 L nocturnally) due to Covid 19 Viral pneumonia:  Significantly better-less cough-inflammatory markers have essentially normalized.  Treated with  steroids/remdesivir.  Stable on just 2 L of oxygen-she apparently is on home O2 only at night-but given this acute illness-have advised her to use oxygen 24/7 until evaluated by PCP.  Stable for discharge-we will continue on tapering steroids.     COVID-19 Labs:  Recent Labs    01/21/20 0430 01/22/20 0425 01/23/20 0458  DDIMER 1.85* 1.70* 1.47*  FERRITIN 138 102 94  CRP 1.2* 1.4* 0.9    Lab Results  Component Value Date   SARSCOV2NAA POSITIVE (A) 01/19/2020   [place] NEGATIVE 01/12/2020   [place] NEGATIVE 12/11/2019   [place] NEGATIVE 12/05/2019     Transaminitis: Appears to be mild-likely secondary to COVID-19-downtrending-repeat electrolytes/LFTs in the next week or so at follow-up with PCP.  Chronic systolic heart failure: Volume status is stable-continue Demadex.  Please ensure patient has follow-up with cardiology.  CKD stage IIIb: Creatinine not very far from baseline-follow periodically.  Questionable episode of melena: Doubt GI bleeding-appreciate GI input-on aspirin/Plavix without any evidence of melena/hematochezia.  Hemoglobin has been stable.  Continue PPI.    CAD: No anginal symptoms-continue aspirin/Plavix/beta-blocker follow.  Last [facility] May 2021 showed stable anatomy.  PAD: History of subclavian stenting in 2013-stable.  History of ventricular tachycardia-s/p ICD shocks prompting her last hospitalization: Continue telemetry monitoring-remains on amiodarone.  Follow magnesium and potassium.  DM-2 (A1c 6.7 on 7/31): CBG stable with SSI while inpatient-appears to be diet/exercise control at home-defer to PCP for further optimization.  Hypothyroidism: Continue Synthroid  Depression/anxiety: Appears stable-continue Cymbalta and Xanax.  Nondisplaced left wrist fracture: Continue with splint-WABT through elbow.  Follow-up with Dr. Jeannie Fend in 2 weeks.  Was evaluated by orthopedics on 7/29.  Debility/deconditioning: Secondary to acute  illness-evaluated by PT with recommendations for home health on discharge  Discharge Diagnoses:  Principal Problem:   Pneumonia due to COVID-19 virus Active Problems:   Hyperlipidemia LDL goal <70   Depression with anxiety   Hypertension   COPD (chronic obstructive pulmonary disease) (HCC)   Implantable cardioverter-defibrillator (ICD) in situ   CKD (chronic kidney disease), stage III   Type 2 diabetes mellitus with diabetic chronic kidney disease (HCC)   Abnormal thyroid function test   Fracture of left wrist   COVID-19   Discharge Instructions:    Person Under Monitoring Name: Sydney Phillips  Location: [address] [facility] [address]   Infection Prevention Recommendations for Individuals Confirmed to have, or Being Evaluated for, 2019 Novel Coronavirus (COVID-19) Infection Who Receive Care at Home  Individuals who are confirmed to have, or are being evaluated for, COVID-19 should follow the prevention steps below until a healthcare provider or local or state health department says they can return to normal activities.  Stay home except to get medical care You should restrict activities outside your home, except for getting medical care. Do not go to work, school, or public areas, and do not use public transportation or taxis.  Call ahead before visiting your doctor Before your medical appointment, call the healthcare provider and tell them that you have, or are being evaluated for, COVID-19 infection. This will help the healthcare provider's office take steps to keep other people from getting infected. Ask your healthcare provider to call the local or state health department.  Monitor your symptoms Seek prompt medical attention if your illness is worsening (e.g., difficulty breathing). Before going to your medical appointment, call the healthcare provider and tell them that you have, or are being evaluated for, COVID-19 infection. Ask your healthcare  provider to call the local or state health department.  Wear a facemask You should wear a facemask that covers your nose and mouth when you are in the same room with other people and when you visit a healthcare provider. People who live with or visit you should also wear a facemask while they are in the same room with you.  Separate yourself from other people in your home As much as possible, you should stay in a different room from other people in your home. Also, you should use a separate bathroom, if available.  Avoid sharing household items You should not share dishes, drinking glasses, cups, eating utensils, towels, bedding, or other items with other people in your home. After using these items, you should wash them thoroughly with soap and water.  Cover your coughs and sneezes Cover your mouth and nose with a tissue when you cough or sneeze, or you can cough or sneeze into your sleeve. Throw used tissues in a lined trash can, and immediately wash your hands with soap and water for at least 20 seconds or use an alcohol-based hand rub.  Wash your [facility] your hands often and thoroughly with soap and water for at least 20 seconds. You can use an alcohol-based hand sanitizer if soap and water are not available and if your hands are not visibly dirty. Avoid touching your eyes, nose, and mouth with unwashed hands.   Prevention Steps for Caregivers and Household Members of Individuals Confirmed to have, or Being Evaluated for, COVID-19 Infection Being Cared for in the Home  If you live  with, or provide care at home for, a person confirmed to have, or being evaluated for, COVID-19 infection please follow these guidelines to prevent infection:  Follow healthcare provider's instructions Make sure that you understand and can help the patient follow any healthcare provider instructions for all care.  Provide for the patient's basic needs You should help the patient with basic needs  in the home and provide support for getting groceries, prescriptions, and other personal needs.  Monitor the patient's symptoms If they are getting sicker, call his or her medical provider and tell them that the patient has, or is being evaluated for, COVID-19 infection. This will help the healthcare provider's office take steps to keep other people from getting infected. Ask the healthcare provider to call the local or state health department.  Limit the number of people who have contact with the patient  If possible, have only one caregiver for the patient.  Other household members should stay in another home or place of residence. If this is not possible, they should stay  in another room, or be separated from the patient as much as possible. Use a separate bathroom, if available.  Restrict visitors who do not have an essential need to be in the home.  Keep older adults, very young children, and other sick people away from the patient Keep older adults, very young children, and those who have compromised immune systems or chronic health conditions away from the patient. This includes people with chronic heart, lung, or kidney conditions, diabetes, and cancer.  Ensure good ventilation Make sure that shared spaces in the home have good air flow, such as from an air conditioner or an opened window, weather permitting.  Wash your hands often  Wash your hands often and thoroughly with soap and water for at least 20 seconds. You can use an alcohol based hand sanitizer if soap and water are not available and if your hands are not visibly dirty.  Avoid touching your eyes, nose, and mouth with unwashed hands.  Use disposable paper towels to dry your hands. If not available, use dedicated cloth towels and replace them when they become wet.  Wear a facemask and gloves  Wear a disposable facemask at all times in the room and gloves when you touch or have contact with the patient's blood,  body fluids, and/or secretions or excretions, such as sweat, saliva, sputum, nasal mucus, vomit, urine, or feces.  Ensure the mask fits over your nose and mouth tightly, and do not touch it during use.  Throw out disposable facemasks and gloves after using them. Do not reuse.  Wash your hands immediately after removing your facemask and gloves.  If your personal clothing becomes contaminated, carefully remove clothing and launder. Wash your hands after handling contaminated clothing.  Place all used disposable facemasks, gloves, and other waste in a lined container before disposing them with other household waste.  Remove gloves and wash your hands immediately after handling these items.  Do not share dishes, glasses, or other household items with the patient  Avoid sharing household items. You should not share dishes, drinking glasses, cups, eating utensils, towels, bedding, or other items with a patient who is confirmed to have, or being evaluated for, COVID-19 infection.  After the person uses these items, you should wash them thoroughly with soap and water.  Wash laundry thoroughly  Immediately remove and wash clothes or bedding that have blood, body fluids, and/or secretions or excretions, such as sweat, saliva, sputum, nasal  mucus, vomit, urine, or feces, on them.  Wear gloves when handling laundry from the patient.  Read and follow directions on labels of laundry or clothing items and detergent. In general, wash and dry with the warmest temperatures recommended on the label.  Clean all areas the individual has used often  Clean all touchable surfaces, such as counters, tabletops, doorknobs, bathroom fixtures, toilets, phones, keyboards, tablets, and bedside tables, every day. Also, clean any surfaces that may have blood, body fluids, and/or secretions or excretions on them.  Wear gloves when cleaning surfaces the patient has come in contact with.  Use a diluted bleach solution  (e.g., dilute bleach with 1 part bleach and 10 parts water) or a household disinfectant with a label that says EPA-registered for coronaviruses. To make a bleach solution at home, add 1 tablespoon of bleach to 1 quart (4 cups) of water. For a larger supply, add  cup of bleach to 1 gallon (16 cups) of water.  Read labels of cleaning products and follow recommendations provided on product labels. Labels contain instructions for safe and effective use of the cleaning product including precautions you should take when applying the product, such as wearing gloves or eye protection and making sure you have good ventilation during use of the product.  Remove gloves and wash hands immediately after cleaning.  Monitor yourself for signs and symptoms of illness Caregivers and household members are considered close contacts, should monitor their health, and will be asked to limit movement outside of the home to the extent possible. Follow the monitoring steps for close contacts listed on the symptom monitoring form.   ? If you have additional questions, contact your local health department or call the epidemiologist on call at [phone] (available 24/7). ? This guidance is subject to change. For the most up-to-date guidance from CDC, please refer to their website: YouBlogs.pl    Activity:  As tolerated with Full fall precautions use walker/cane & assistance as needed   Discharge Instructions    (HEART FAILURE PATIENTS) Call MD:  Anytime you have any of the following symptoms: 1) 3 pound weight gain in 24 hours or 5 pounds in 1 week 2) shortness of breath, with or without a dry hacking cough 3) swelling in the hands, feet or stomach 4) if you have to sleep on extra pillows at night in order to breathe.   Complete by: As directed    Call MD for:  difficulty breathing, headache or visual disturbances   Complete by: As directed    Call MD  for:  persistant nausea and vomiting   Complete by: As directed    Diet - low sodium heart healthy   Complete by: As directed    Diet Carb Modified   Complete by: As directed    Discharge instructions   Complete by: As directed    Follow with Primary MD  Laurey Morale, MD in 1-2 weeks  Please follow-up with cardiology-in the next 1-2 weeks.  Please follow with orthopedics-Dr. Jeannie Fend in the next 1-2 weeks.  Please get a complete blood count and chemistry panel checked by your Primary MD at your next visit, and again as instructed by your Primary MD.  Get Medicines reviewed and adjusted: Please take all your medications with you for your next visit with your Primary MD  Laboratory/radiological data: Please request your Primary MD to go over all hospital tests and procedure/radiological results at the follow up, please ask your Primary MD to get all  Hospital records sent to his/her office.  In some cases, they will be blood work, cultures and biopsy results pending at the time of your discharge. Please request that your primary care M.D. follows up on these results.  Also Note the following: If you experience worsening of your admission symptoms, develop shortness of breath, life threatening emergency, suicidal or homicidal thoughts you must seek medical attention immediately by calling 911 or calling your MD immediately  if symptoms less severe.  You must read complete instructions/literature along with all the possible adverse reactions/side effects for all the Medicines you take and that have been prescribed to you. Take any new Medicines after you have completely understood and accpet all the possible adverse reactions/side effects.   Do not drive when taking Pain medications or sleeping medications (Benzodaizepines)  Do not take more than prescribed Pain, Sleep and Anxiety Medications. It is not advisable to combine anxiety,sleep and pain medications without talking with your  primary care practitioner  Special Instructions: If you have smoked or chewed Tobacco  in the last 2 yrs please stop smoking, stop any regular Alcohol  and or any Recreational drug use.  Wear Seat belts while driving.  Please note: You were cared for by a hospitalist during your hospital stay. Once you are discharged, your primary care physician will handle any further medical issues. Please note that NO REFILLS for any discharge medications will be authorized once you are discharged, as it is imperative that you return to your primary care physician (or establish a relationship with a primary care physician if you do not have one) for your post hospital discharge needs so that they can reassess your need for medications and monitor your lab values.   1.)  21 days of isolation from 01/19/2020   Increase activity slowly   Complete by: As directed      Allergies as of 01/23/2020      Reactions   Potassium-containing Compounds Other (See Comments)   Causes severe constipation   Neomycin-polymyxin B Gu    Swollen throat    Other Other (See Comments)   "mycin" antibiotics cause sleeplessness and itching   Azithromycin Rash   Codeine Itching   Darvon Itching   Erythromycin Rash   Meloxicam Other (See Comments)   Unknown    Norco [hydrocodone-acetaminophen] Itching   Penicillins Rash, Other (See Comments)   Has patient had a PCN reaction causing immediate rash, facial/tongue/throat swelling, SOB or lightheadedness with hypotension: unknown Has patient had a PCN reaction causing severe rash involving mucus membranes or skin necrosis: no Has patient had a PCN reaction that required hospitalization: unknown Has patient had a PCN reaction occurring within the last 10 years: no If all of the above answers are "NO", then may proceed with Cephalosporin use.   Propoxyphene N-acetaminophen Itching   Rofecoxib Other (See Comments)   Unknown    Rosuvastatin Other (See Comments)   cramps   Statins  Itching, Other (See Comments)   Sleeplessness Sleeplessness   Sulfa Antibiotics Rash      Medication List    TAKE these medications   Accu-Chek FastClix Lancets Misc USE   TO CHECK GLUCOSE ONCE DAILY What changed:   how much to take  how to take this  when to take this  additional instructions   Accu-Chek SmartView test strip Generic drug: glucose blood USE  STRIP TO CHECK GLUCOSE ONCE DAILY What changed: See the new instructions.   albuterol (2.5 MG/3ML) 0.083% nebulizer solution Commonly  known as: PROVENTIL USE 1 VIAL IN NEBULIZER EVERY 4 HOURS AS NEEDED FOR WHEEZING FOR SHORTNESS OF BREATH What changed: See the new instructions.   ProAir HFA 108 (90 Base) MCG/ACT inhaler Generic drug: albuterol INHALE 2 PUFFS BY MOUTH EVERY 4 HOURS AS NEEDED FOR WHEEZING AND FOR SHORTNESS OF BREATH What changed: See the new instructions.   allopurinol 300 MG tablet Commonly known as: ZYLOPRIM Take 1 tablet by mouth once daily   ALPRAZolam 1 MG tablet Commonly known as: XANAX TAKE 1 TABLET BY MOUTH IN THE MORNING AND 1 & 1/2 (ONE & ONE-HALF) NIGHTLY AS NEEDED FOR ANXIETY What changed: See the new instructions.   amiodarone 200 MG tablet Commonly known as: PACERONE Take 1 tablet (200 mg total) by mouth 2 (two) times daily.   aspirin 81 MG chewable tablet Chew 1 tablet (81 mg total) by mouth daily.   azelastine 0.1 % nasal spray Commonly known as: ASTELIN USE 2 SPRAY(S) IN EACH NOSTRIL TWICE DAILY AS DIRECTED What changed: See the new instructions.   bisoprolol 5 MG tablet Commonly known as: ZEBETA Take 1 tablet (5 mg total) by mouth daily.   clopidogrel 75 MG tablet Commonly known as: PLAVIX Take 1 tablet by mouth once daily   Cyanocobalamin 5000 MCG Tbdp Take 5,000 mcg by mouth daily.   dexamethasone 6 MG tablet Commonly known as: DECADRON Take 1 tablet (6 mg total) by mouth daily.   DULoxetine 60 MG capsule Commonly known as: CYMBALTA Take 1 capsule (60 mg  total) by mouth 2 (two) times daily.   loratadine 10 MG tablet Commonly known as: CLARITIN Take 10 mg by mouth daily.   magnesium oxide 400 (241.3 Mg) MG tablet Commonly known as: MAG-OX Take 1 tablet (400 mg total) by mouth daily. Start taking on: January 24, 2020   nitroGLYCERIN 0.4 MG SL tablet Commonly known as: NITROSTAT Place 1 tablet (0.4 mg total) under the tongue every 5 (five) minutes as needed for chest pain. X 3 doses What changed:   when to take this  additional instructions   pantoprazole 40 MG tablet Commonly known as: PROTONIX Take 1 tablet (40 mg total) by mouth daily at 12 noon.   potassium chloride SA 20 MEQ tablet Commonly known as: KLOR-CON Take 1 tablet (20 mEq total) by mouth daily.   torsemide 20 MG tablet Commonly known as: DEMADEX Take 4 tablets (80 mg total) by mouth 2 (two) times daily.   VITAMIN C PO Take 1 tablet by mouth in the morning and at bedtime.   Vitamin D 50 MCG (2000 UT) Caps Take 4,000 Units by mouth daily.            Durable Medical Equipment  (From admission, onward)         Start     Ordered   01/22/20 1355  For home use only DME Walker rolling  Once       Question Answer Comment  Walker: With [place] Wheels   Patient needs a walker to treat with the following condition Weakness      01/22/20 1354   01/22/20 1352  For home use only DME Walker platform  Once       Comments: Lt sided  Question:  Patient needs a walker to treat with the following condition  Answer:  Weakness   01/22/20 1354          Follow-up Information    [facility] Follow up.   Specialty: Home  Health Services Why: The home health agency will contact you for the first home visit. Contact information: [place] [address] [phone]        Laurey Morale, MD. Schedule an appointment as soon as possible for a visit in 1 week(s).   Specialty: Family Medicine Contact information: [place] [address] [phone]        Croitoru, Dani Gobble, MD Follow up in 2 week(s).   Specialty: Cardiology Contact information: [address] [place] [address] [phone]        [name], MD .   Specialty: Cardiology Contact information: [address] [place] [address] [phone]        Verner Mould, MD. Schedule an appointment as soon as possible for a visit in 2 week(s).   Contact information: [address] [place] [address] [phone]              Allergies  Allergen Reactions  . Potassium-Containing Compounds Other (See Comments)    Causes severe constipation  . Neomycin-Polymyxin B Gu     Swollen throat   . Other Other (See Comments)    "mycin" antibiotics cause sleeplessness and itching  . Azithromycin Rash  . Codeine Itching  . Darvon Itching  . Erythromycin Rash  . Meloxicam Other (See Comments)    Unknown    . Norco [Hydrocodone-Acetaminophen] Itching  . Penicillins Rash and Other (See Comments)    Has patient had a PCN reaction causing immediate rash, facial/tongue/throat swelling, SOB or lightheadedness with hypotension: unknown Has patient had a PCN reaction causing severe rash involving mucus membranes or skin necrosis: no Has patient had a PCN reaction that required hospitalization: unknown Has patient had a PCN reaction occurring within the last 10 years: no If all of the above answers are "NO", then may proceed with Cephalosporin use.   Marland Kitchen Propoxyphene N-Acetaminophen Itching  . Rofecoxib Other (See Comments)    Unknown    . Rosuvastatin Other (See Comments)    cramps  . Statins Itching and Other (See Comments)    Sleeplessness Sleeplessness  . Sulfa Antibiotics Rash     Other Procedures/Studies: DG Chest 2 View  Result Date: 01/19/2020 CLINICAL DATA:  Fatigue.  History of CHF. EXAM: CHEST - 2 VIEW COMPARISON:  12/05/2019.  09/30/2019.  FINDINGS: AICD in stable position. Again noted cardiomegaly with bilateral interstitial prominence suggesting CHF. Small bilateral pleural effusion may be present. Calcified left lung nodule consistent with granuloma again noted. No pneumothorax. Degenerative change thoracic spine. Lower thoracic spine compression fracture noted on today's exam. IMPRESSION: 1. AICD in stable position. Cardiomegaly with bilateral interstitial prominence again noted suggesting CHF. Similar findings noted on prior exam. Small bilateral pleural effusions may be present. 2. Lower thoracic vertebral body compression fracture may be present. Electronically Signed   By: Marcello Moores  Register   On: 01/19/2020 05:14   DG Chest 2 View  Result Date: 01/12/2020 CLINICAL DATA:  Chest pain. EXAM: CHEST - 2 VIEW COMPARISON:  12/05/2019. FINDINGS: AICD in stable position. Again noted cardiomegaly with pulmonary venous congestion bilateral interstitial prominence and small bilateral pleural effusions. Findings consistent CHF. Similar findings noted on prior exam. Calcified pulmonary nodules noted consistent prior granulomas disease. No pneumothorax. IMPRESSION: AICD in stable position. Cardiomegaly with pulmonary venous congestion, bilateral interstitial prominence, and small bilateral pleural effusions again noted. Findings again consistent with CHF. Similar findings noted on prior  exam. Electronically Signed   By: Marcello Moores  Register   On: 01/12/2020 05:20   DG Elbow Complete Left  Result Date: 12/27/2019 CLINICAL DATA:  Left elbow pain for 3 days since a fall. EXAM: LEFT ELBOW - COMPLETE 3+ VIEW COMPARISON:  Plain films left elbow 08/08/2011. FINDINGS: Healed olecranon fracture is identified with 2 K-wires and a figure of 8 tension band. No joint effusion. Mild degenerative change about the articulation of the ulna and trochlea noted. IMPRESSION: No acute abnormality. Healed olecranon fracture with fixation hardware in place. Electronically Signed    By: Inge Rise M.D.   On: 12/27/2019 09:28   DG Wrist Complete Left  Result Date: 01/12/2020 CLINICAL DATA:  Persistent left wrist pain after fall 1 month ago EXAM: LEFT WRIST - COMPLETE 3+ VIEW COMPARISON:  12/26/2019 FINDINGS: No definite acute fracture is identified. Possible subtle horizontal lucency at the distal ulna could reflect a subacute nondisplaced fracture. No definite fracture line is seen in at the radial styloid. Mildly widened scapholunate interval of 3 mm. Similar degree of osteoarthritis, most pronounced at the first [facility] and triscaphe joints. Bones are demineralized. Soft tissue swelling persists. IMPRESSION: 1. Possible subtle subacute nondisplaced fracture at the distal ulna. Correlate with point tenderness. 2. No definite acute or healing distal radial fracture is identified. 3. Mildly widened scapholunate interval of 3 mm suggesting underlying ligamentous injury. Electronically Signed   By: Davina Poke D.O.   On: 01/12/2020 08:47   DG Wrist Complete Left  Result Date: 12/27/2019 CLINICAL DATA:  Left wrist pain for 3 days since a fall. Initial encounter. EXAM: LEFT WRIST - COMPLETE 3+ VIEW COMPARISON:  None. FINDINGS: Soft tissues about the wrist are swollen. A subtle cortical step-off is seen on the posterior aspect of the distal radius on the lateral view and there is subtle lucency through the base of the radial styloid. Scattered osteoarthritis is worst at the first [facility] joint. IMPRESSION: Marked soft tissue swelling about the wrist with a likely nondisplaced fracture through the base of the radial styloid. Osteoarthritis. Electronically Signed   By: Inge Rise M.D.   On: 12/27/2019 09:27     TODAY-DAY OF DISCHARGE:  Subjective:   Sydney Phillips today has no headache,no chest abdominal pain,no new weakness tingling or numbness, feels much better wants to go home today.   Objective:   Blood pressure 128/60, pulse (!) 59, temperature 97.7 F (36.5 C),  temperature source Oral, resp. rate 20, weight 81.1 kg, SpO2 97 %.  Intake/Output Summary (Last 24 hours) at 01/23/2020 1012 Last data filed at 01/23/2020 1010 Gross per 24 hour  Intake 690 ml  Output 1800 ml  Net -1110 ml   Filed Weights   01/22/20 0500 01/23/20 0500  Weight: 81 kg 81.1 kg    Exam: Awake Alert, Oriented *3, No new F.N deficits, Normal affect [place].AT,PERRAL Supple Neck,No JVD, No cervical lymphadenopathy appriciated.  Symmetrical Chest wall movement, Good air movement bilaterally, CTAB RRR,No Gallops,Rubs or new Murmurs, No Parasternal Heave +ve B.Sounds, Abd Soft, Non tender, No organomegaly appriciated, No rebound -guarding or rigidity. No Cyanosis, Clubbing or edema, No new Rash or bruise   PERTINENT RADIOLOGIC STUDIES: DG Chest 2 View  Result Date: 01/19/2020 CLINICAL DATA:  Fatigue.  History of CHF. EXAM: CHEST - 2 VIEW COMPARISON:  12/05/2019.  09/30/2019. FINDINGS: AICD in stable position. Again noted cardiomegaly with bilateral interstitial prominence suggesting CHF. Small bilateral pleural effusion may be present. Calcified left lung nodule consistent with granuloma again  noted. No pneumothorax. Degenerative change thoracic spine. Lower thoracic spine compression fracture noted on today's exam. IMPRESSION: 1. AICD in stable position. Cardiomegaly with bilateral interstitial prominence again noted suggesting CHF. Similar findings noted on prior exam. Small bilateral pleural effusions may be present. 2. Lower thoracic vertebral body compression fracture may be present. Electronically Signed   By: Marcello Moores  Register   On: 01/19/2020 05:14   DG Chest 2 View  Result Date: 01/12/2020 CLINICAL DATA:  Chest pain. EXAM: CHEST - 2 VIEW COMPARISON:  12/05/2019. FINDINGS: AICD in stable position. Again noted cardiomegaly with pulmonary venous congestion bilateral interstitial prominence and small bilateral pleural effusions. Findings consistent CHF. Similar findings noted on prior  exam. Calcified pulmonary nodules noted consistent prior granulomas disease. No pneumothorax. IMPRESSION: AICD in stable position. Cardiomegaly with pulmonary venous congestion, bilateral interstitial prominence, and small bilateral pleural effusions again noted. Findings again consistent with CHF. Similar findings noted on prior exam. Electronically Signed   By: Marcello Moores  Register   On: 01/12/2020 05:20   DG Elbow Complete Left  Result Date: 12/27/2019 CLINICAL DATA:  Left elbow pain for 3 days since a fall. EXAM: LEFT ELBOW - COMPLETE 3+ VIEW COMPARISON:  Plain films left elbow 08/08/2011. FINDINGS: Healed olecranon fracture is identified with 2 K-wires and a figure of 8 tension band. No joint effusion. Mild degenerative change about the articulation of the ulna and trochlea noted. IMPRESSION: No acute abnormality. Healed olecranon fracture with fixation hardware in place. Electronically Signed   By: Inge Rise M.D.   On: 12/27/2019 09:28   DG Wrist Complete Left  Result Date: 01/12/2020 CLINICAL DATA:  Persistent left wrist pain after fall 1 month ago EXAM: LEFT WRIST - COMPLETE 3+ VIEW COMPARISON:  12/26/2019 FINDINGS: No definite acute fracture is identified. Possible subtle horizontal lucency at the distal ulna could reflect a subacute nondisplaced fracture. No definite fracture line is seen in at the radial styloid. Mildly widened scapholunate interval of 3 mm. Similar degree of osteoarthritis, most pronounced at the first [facility] and triscaphe joints. Bones are demineralized. Soft tissue swelling persists. IMPRESSION: 1. Possible subtle subacute nondisplaced fracture at the distal ulna. Correlate with point tenderness. 2. No definite acute or healing distal radial fracture is identified. 3. Mildly widened scapholunate interval of 3 mm suggesting underlying ligamentous injury. Electronically Signed   By: Davina Poke D.O.   On: 01/12/2020 08:47   DG Wrist Complete Left  Result Date:  12/27/2019 CLINICAL DATA:  Left wrist pain for 3 days since a fall. Initial encounter. EXAM: LEFT WRIST - COMPLETE 3+ VIEW COMPARISON:  None. FINDINGS: Soft tissues about the wrist are swollen. A subtle cortical step-off is seen on the posterior aspect of the distal radius on the lateral view and there is subtle lucency through the base of the radial styloid. Scattered osteoarthritis is worst at the first [facility] joint. IMPRESSION: Marked soft tissue swelling about the wrist with a likely nondisplaced fracture through the base of the radial styloid. Osteoarthritis. Electronically Signed   By: Inge Rise M.D.   On: 12/27/2019 09:27     PERTINENT LAB RESULTS: CBC: No results for input(s): WBC, HGB, HCT, PLT in the last 72 hours. CMET CMP     Component Value Date/Time   NA 138 01/23/2020 0458   NA 144 11/16/2019 1407   K 4.0 01/23/2020 0458   CL 100 01/23/2020 0458   CO2 30 01/23/2020 0458   GLUCOSE 98 01/23/2020 0458   BUN 57 (H) 01/23/2020 0458  BUN 43 (H) 11/16/2019 1407   CREATININE 1.94 (H) 01/23/2020 0458   CREATININE 1.05 (H) 08/07/2016 1433   CALCIUM 8.5 (L) 01/23/2020 0458   PROT 5.9 (L) 01/23/2020 0458   ALBUMIN 3.2 (L) 01/23/2020 0458   AST 49 (H) 01/23/2020 0458   ALT 76 (H) 01/23/2020 0458   ALKPHOS 81 01/23/2020 0458   BILITOT 0.7 01/23/2020 0458   GFRNONAA 24 (L) 01/23/2020 0458   GFRAA 27 (L) 01/23/2020 0458    GFR Estimated Creatinine Clearance: 23.9 mL/min (A) (by C-G formula based on SCr of 1.94 mg/dL (H)). No results for input(s): LIPASE, AMYLASE in the last 72 hours. No results for input(s): CKTOTAL, CKMB, CKMBINDEX, TROPONINI in the last 72 hours. Invalid input(s): POCBNP Recent Labs    01/22/20 0425 01/23/20 0458  DDIMER 1.70* 1.47*   No results for input(s): HGBA1C in the last 72 hours. No results for input(s): CHOL, HDL, LDLCALC, TRIG, CHOLHDL, LDLDIRECT in the last 72 hours. No results for input(s): TSH, T4TOTAL, T3FREE, THYROIDAB in the last 72  hours.  Invalid input(s): FREET3 Recent Labs    01/22/20 0425 01/23/20 0458  FERRITIN 102 94   Coags: No results for input(s): INR in the last 72 hours.  Invalid input(s): PT Microbiology: Recent Results (from the past 240 hour(s))  SARS Coronavirus 2 by RT PCR (hospital order, performed in [facility] hospital lab) Nasopharyngeal Nasopharyngeal Swab     Status: Abnormal   Collection Time: 01/19/20  5:24 AM   Specimen: Nasopharyngeal Swab  Result Value Ref Range Status   SARS Coronavirus 2 POSITIVE (A) NEGATIVE Final    Comment: RESULT CALLED TO, READ BACK BY AND VERIFIED WITH: RN C HARRIS [id number] AT 75 AM BY CM (NOTE) SARS-CoV-2 target nucleic acids are DETECTED  SARS-CoV-2 RNA is generally detectable in upper respiratory specimens  during the acute phase of infection.  Positive results are indicative  of the presence of the identified virus, but do not rule out bacterial infection or co-infection with other pathogens not detected by the test.  Clinical correlation with patient history and  other diagnostic information is necessary to determine patient infection status.  The expected result is negative.  Fact Sheet for Patients:   StrictlyIdeas.no   Fact Sheet for Healthcare Providers:   BankingDealers.co.za    This test is not yet approved or cleared by the [place] FDA and  has been authorized for detection and/or diagnosis of SARS-CoV-2 by FDA under an Emergency Use Authorization (EUA).  This EUA will remain in effect (meaning this te st can be used) for the duration of  the COVID-19 declaration under Section 564(b)(1) of the Act, 21 U.S.C. section 360-bbb-3(b)(1), unless the authorization is terminated or revoked sooner.  Performed at [facility], [place] [address], [place], [place] [address]   Blood Culture (routine x 2)     Status: None (Preliminary result)   Collection Time: 01/19/20  8:00 AM    Specimen: BLOOD RIGHT HAND  Result Value Ref Range Status   Specimen Description BLOOD RIGHT HAND  Final   Special Requests   Final    BOTTLES DRAWN AEROBIC AND ANAEROBIC Blood Culture results may not be optimal due to an inadequate volume of blood received in culture bottles   Culture   Final    NO GROWTH 4 DAYS Performed at [facility], [place] [address], [place], [place] [address]    Report Status PENDING  Incomplete  Blood Culture (routine x 2)  Status: None (Preliminary result)   Collection Time: 01/19/20  8:11 AM   Specimen: BLOOD RIGHT HAND  Result Value Ref Range Status   Specimen Description BLOOD RIGHT HAND  Final   Special Requests   Final    BOTTLES DRAWN AEROBIC ONLY Blood Culture results may not be optimal due to an inadequate volume of blood received in culture bottles   Culture   Final    NO GROWTH 4 DAYS Performed at [facility], [place] [address] [place], [place] [address]    Report Status PENDING  Incomplete    FURTHER DISCHARGE INSTRUCTIONS:  Get Medicines reviewed and adjusted: Please take all your medications with you for your next visit with your Primary MD  Laboratory/radiological data: Please request your Primary MD to go over all hospital tests and procedure/radiological results at the follow up, please ask your Primary MD to get all Hospital records sent to his/her office.  In some cases, they will be blood work, cultures and biopsy results pending at the time of your discharge. Please request that your primary care M.D. goes through all the records of your hospital data and follows up on these results.  Also Note the following: If you experience worsening of your admission symptoms, develop shortness of breath, life threatening emergency, suicidal or homicidal thoughts you must seek medical attention immediately by calling 911 or calling your MD immediately  if symptoms less severe.  You must read complete instructions/literature along with  all the possible adverse reactions/side effects for all the Medicines you take and that have been prescribed to you. Take any new Medicines after you have completely understood and accpet all the possible adverse reactions/side effects.   Do not drive when taking Pain medications or sleeping medications (Benzodaizepines)  Do not take more than prescribed Pain, Sleep and Anxiety Medications. It is not advisable to combine anxiety,sleep and pain medications without talking with your primary care practitioner  Special Instructions: If you have smoked or chewed Tobacco  in the last 2 yrs please stop smoking, stop any regular Alcohol  and or any Recreational drug use.  Wear Seat belts while driving.  Please note: You were cared for by a hospitalist during your hospital stay. Once you are discharged, your primary care physician will handle any further medical issues. Please note that NO REFILLS for any discharge medications will be authorized once you are discharged, as it is imperative that you return to your primary care physician (or establish a relationship with a primary care physician if you do not have one) for your post hospital discharge needs so that they can reassess your need for medications and monitor your lab values.  Total Time spent coordinating discharge including counseling, education and face to face time equals 35 minutes.  Signed: Keaghan Staton 01/23/2020 10:12 AM

## 2020-01-23 NOTE — Progress Notes (Signed)
Patient was discharged home by MD order; discharged instructions  review and give to patient with care notes and prescriptions; IV DIC; skin intact; patient will be escorted to the car by nurse tech via wheelchair. Ortho walker brought down to patient's car trunk. No distress noted at discharge. Pt stated she is only on home o2 at night. Pt's O2 sat 96% RA at discharge home with home health., All questions answered included an instruction to pick up her prescribed meds [facility].

## 2020-01-23 NOTE — Discharge Instructions (Signed)
Person Under Monitoring Name: Sydney Phillips  Location: [address] [place] [facility] [address]   Infection Prevention Recommendations for Individuals Confirmed to have, or Being Evaluated for, 2019 Novel Coronavirus (COVID-19) Infection Who Receive Care at Home  Individuals who are confirmed to have, or are being evaluated for, COVID-19 should follow the prevention steps below until a healthcare provider or local or state health department says they can return to normal activities.  Stay home except to get medical care You should restrict activities outside your home, except for getting medical care. Do not go to work, school, or public areas, and do not use public transportation or taxis.  Call ahead before visiting your doctor Before your medical appointment, call the healthcare provider and tell them that you have, or are being evaluated for, COVID-19 infection. This will help the healthcare providers office take steps to keep other people from getting infected. Ask your healthcare provider to call the local or state health department.  Monitor your symptoms Seek prompt medical attention if your illness is worsening (e.g., difficulty breathing). Before going to your medical appointment, call the healthcare provider and tell them that you have, or are being evaluated for, COVID-19 infection. Ask your healthcare provider to call the local or state health department.  Wear a facemask You should wear a facemask that covers your nose and mouth when you are in the same room with other people and when you visit a healthcare provider. People who live with or visit you should also wear a facemask while they are in the same room with you.  Separate yourself from other people in your home As much as possible, you should stay in a different room from other people in your home. Also, you should use a separate bathroom, if available.  Avoid sharing household items You  should not share dishes, drinking glasses, cups, eating utensils, towels, bedding, or other items with other people in your home. After using these items, you should wash them thoroughly with soap and water.  Cover your coughs and sneezes Cover your mouth and nose with a tissue when you cough or sneeze, or you can cough or sneeze into your sleeve. Throw used tissues in a lined trash can, and immediately wash your hands with soap and water for at least 20 seconds or use an alcohol-based hand rub.  Wash your [facility] your hands often and thoroughly with soap and water for at least 20 seconds. You can use an alcohol-based hand sanitizer if soap and water are not available and if your hands are not visibly dirty. Avoid touching your eyes, nose, and mouth with unwashed hands.   Prevention Steps for Caregivers and Household Members of Individuals Confirmed to have, or Being Evaluated for, COVID-19 Infection Being Cared for in the Home  If you live with, or provide care at home for, a person confirmed to have, or being evaluated for, COVID-19 infection please follow these guidelines to prevent infection:  Follow healthcare providers instructions Make sure that you understand and can help the patient follow any healthcare provider instructions for all care.  Provide for the patients basic needs You should help the patient with basic needs in the home and provide support for getting groceries, prescriptions, and other personal needs.  Monitor the patients symptoms If they are getting sicker, call his or her medical provider and tell them that the patient has, or is being evaluated for, COVID-19 infection. This will help the healthcare providers office  take steps to keep other people from getting infected. Ask the healthcare provider to call the local or state health department.  Limit the number of people who have contact with the patient  If possible, have only one caregiver for the  patient.  Other household members should stay in another home or place of residence. If this is not possible, they should stay  in another room, or be separated from the patient as much as possible. Use a separate bathroom, if available.  Restrict visitors who do not have an essential need to be in the home.  Keep older adults, very young children, and other sick people away from the patient Keep older adults, very young children, and those who have compromised immune systems or chronic health conditions away from the patient. This includes people with chronic heart, lung, or kidney conditions, diabetes, and cancer.  Ensure good ventilation Make sure that shared spaces in the home have good air flow, such as from an air conditioner or an opened window, weather permitting.  Wash your hands often  Wash your hands often and thoroughly with soap and water for at least 20 seconds. You can use an alcohol based hand sanitizer if soap and water are not available and if your hands are not visibly dirty.  Avoid touching your eyes, nose, and mouth with unwashed hands.  Use disposable paper towels to dry your hands. If not available, use dedicated cloth towels and replace them when they become wet.  Wear a facemask and gloves  Wear a disposable facemask at all times in the room and gloves when you touch or have contact with the patients blood, body fluids, and/or secretions or excretions, such as sweat, saliva, sputum, nasal mucus, vomit, urine, or feces.  Ensure the mask fits over your nose and mouth tightly, and do not touch it during use.  Throw out disposable facemasks and gloves after using them. Do not reuse.  Wash your hands immediately after removing your facemask and gloves.  If your personal clothing becomes contaminated, carefully remove clothing and launder. Wash your hands after handling contaminated clothing.  Place all used disposable facemasks, gloves, and other waste in a lined  container before disposing them with other household waste.  Remove gloves and wash your hands immediately after handling these items.  Do not share dishes, glasses, or other household items with the patient  Avoid sharing household items. You should not share dishes, drinking glasses, cups, eating utensils, towels, bedding, or other items with a patient who is confirmed to have, or being evaluated for, COVID-19 infection.  After the person uses these items, you should wash them thoroughly with soap and water.  Wash laundry thoroughly  Immediately remove and wash clothes or bedding that have blood, body fluids, and/or secretions or excretions, such as sweat, saliva, sputum, nasal mucus, vomit, urine, or feces, on them.  Wear gloves when handling laundry from the patient.  Read and follow directions on labels of laundry or clothing items and detergent. In general, wash and dry with the warmest temperatures recommended on the label.  Clean all areas the individual has used often  Clean all touchable surfaces, such as counters, tabletops, doorknobs, bathroom fixtures, toilets, phones, keyboards, tablets, and bedside tables, every day. Also, clean any surfaces that may have blood, body fluids, and/or secretions or excretions on them.  Wear gloves when cleaning surfaces the patient has come in contact with.  Use a diluted bleach solution (e.g., dilute bleach with 1 part  bleach and 10 parts water) or a household disinfectant with a label that says EPA-registered for coronaviruses. To make a bleach solution at home, add 1 tablespoon of bleach to 1 quart (4 cups) of water. For a larger supply, add  cup of bleach to 1 gallon (16 cups) of water.  Read labels of cleaning products and follow recommendations provided on product labels. Labels contain instructions for safe and effective use of the cleaning product including precautions you should take when applying the product, such as wearing gloves or  eye protection and making sure you have good ventilation during use of the product.  Remove gloves and wash hands immediately after cleaning.  Monitor yourself for signs and symptoms of illness Caregivers and household members are considered close contacts, should monitor their health, and will be asked to limit movement outside of the home to the extent possible. Follow the monitoring steps for close contacts listed on the symptom monitoring form.   ? If you have additional questions, contact your local health department or call the epidemiologist on call at [phone] (available 24/7). ? This guidance is subject to change. For the most up-to-date guidance from [facility], please refer to their website: YouBlogs.pl

## 2020-01-23 NOTE — Telephone Encounter (Signed)
Transition Care Management Follow-up Telephone Care  Date of discharge and from where: 01/23/2020 from [facility]    How have you been since you were released from the hospital? Patient states that she has been doing ok.  Any questions or concerns? No, patient states she has no concerns at this time  Items Reviewed:  Did the pt receive and understand the discharge instructions provided? Yes   Medications obtained and verified? Yes   Any new allergies since your discharge? No   Dietary orders reviewed? Yes  Do you have support at home? Yes , her son lives in the home with her.  Functional Questionnaire: (I = Independent and D = Dependent) ADLs: I  Bathing/Dressing- I  Meal Prep- D  Eating- I  Maintaining continence- I  Transferring/Ambulation- I with walker   Managing Meds- I   Follow up appointments reviewed:   PCP Hospital f/u appt confirmed? No , patient states unable to schedule at this time as her Son that brings her to appointments has covid also  [facility] f/u appt confirmed? No  states that she will call and schedule with Cardiology   Are transportation arrangements needed? No   If their condition worsens, is the pt aware to call PCP or go to the Emergency Dept.? Yes  Was the patient provided with contact information for the PCP's office or ED? Yes  Was to pt encouraged to call back with questions or concerns? Yes

## 2020-01-24 LAB — CULTURE, BLOOD (ROUTINE X 2)
Culture: NO GROWTH
Culture: NO GROWTH

## 2020-01-26 ENCOUNTER — Other Ambulatory Visit: Payer: Self-pay

## 2020-01-26 NOTE — Patient Outreach (Signed)
[facility]) Care Management  01/26/2020  Sydney Phillips [birth year] [id number]   New referral:  Discharged from [facility] on 01/23/2020 Diagnosis: Covid pneumonia:, CHF, DM , COPD Home health ordered :  [facility] TOC: per MD office and first call completed.  Placed call to patient with no answer. Left a message requesting a call back.   PLAN: additional outreach call n 3 business days. Will Chemical engineer.  Tomasa Rand, RN, BSN, CEN [facility] [employer] [phone]

## 2020-01-29 ENCOUNTER — Other Ambulatory Visit: Payer: Self-pay | Admitting: Family Medicine

## 2020-01-30 ENCOUNTER — Emergency Department (HOSPITAL_COMMUNITY): Payer: Medicare Other

## 2020-01-30 ENCOUNTER — Other Ambulatory Visit: Payer: Self-pay

## 2020-01-30 ENCOUNTER — Encounter (HOSPITAL_COMMUNITY): Payer: Self-pay | Admitting: Emergency Medicine

## 2020-01-30 ENCOUNTER — Inpatient Hospital Stay (HOSPITAL_COMMUNITY)
Admission: EM | Admit: 2020-01-30 | Discharge: 2020-02-10 | DRG: 871 | Disposition: A | Discharge status: Other Acute Inpatient Hospital | Payer: Medicare Other | Attending: Internal Medicine | Admitting: Internal Medicine

## 2020-01-30 ENCOUNTER — Telehealth: Payer: Self-pay | Admitting: Family Medicine

## 2020-01-30 DIAGNOSIS — E785 Hyperlipidemia, unspecified: Secondary | ICD-10-CM | POA: Diagnosis present

## 2020-01-30 DIAGNOSIS — N179 Acute kidney failure, unspecified: Secondary | ICD-10-CM | POA: Diagnosis present

## 2020-01-30 DIAGNOSIS — J690 Pneumonitis due to inhalation of food and vomit: Secondary | ICD-10-CM | POA: Diagnosis present

## 2020-01-30 DIAGNOSIS — Z9981 Dependence on supplemental oxygen: Secondary | ICD-10-CM

## 2020-01-30 DIAGNOSIS — J1282 Pneumonia due to coronavirus disease 2019: Secondary | ICD-10-CM | POA: Diagnosis present

## 2020-01-30 DIAGNOSIS — J80 Acute respiratory distress syndrome: Secondary | ICD-10-CM | POA: Diagnosis present

## 2020-01-30 DIAGNOSIS — Z885 Allergy status to narcotic agent status: Secondary | ICD-10-CM

## 2020-01-30 DIAGNOSIS — Z7982 Long term (current) use of aspirin: Secondary | ICD-10-CM

## 2020-01-30 DIAGNOSIS — J440 Chronic obstructive pulmonary disease with acute lower respiratory infection: Secondary | ICD-10-CM | POA: Diagnosis present

## 2020-01-30 DIAGNOSIS — Z7902 Long term (current) use of antithrombotics/antiplatelets: Secondary | ICD-10-CM

## 2020-01-30 DIAGNOSIS — I472 Ventricular tachycardia: Secondary | ICD-10-CM | POA: Diagnosis present

## 2020-01-30 DIAGNOSIS — H9190 Unspecified hearing loss, unspecified ear: Secondary | ICD-10-CM | POA: Diagnosis present

## 2020-01-30 DIAGNOSIS — L89151 Pressure ulcer of sacral region, stage 1: Secondary | ICD-10-CM | POA: Diagnosis present

## 2020-01-30 DIAGNOSIS — Z95810 Presence of automatic (implantable) cardiac defibrillator: Secondary | ICD-10-CM

## 2020-01-30 DIAGNOSIS — D509 Iron deficiency anemia, unspecified: Secondary | ICD-10-CM | POA: Diagnosis present

## 2020-01-30 DIAGNOSIS — N1832 Chronic kidney disease, stage 3b: Secondary | ICD-10-CM | POA: Diagnosis present

## 2020-01-30 DIAGNOSIS — Z66 Do not resuscitate: Secondary | ICD-10-CM | POA: Diagnosis present

## 2020-01-30 DIAGNOSIS — I340 Nonrheumatic mitral (valve) insufficiency: Secondary | ICD-10-CM | POA: Diagnosis not present

## 2020-01-30 DIAGNOSIS — T380X5A Adverse effect of glucocorticoids and synthetic analogues, initial encounter: Secondary | ICD-10-CM | POA: Diagnosis present

## 2020-01-30 DIAGNOSIS — Z86010 Personal history of colonic polyps: Secondary | ICD-10-CM

## 2020-01-30 DIAGNOSIS — E039 Hypothyroidism, unspecified: Secondary | ICD-10-CM | POA: Diagnosis present

## 2020-01-30 DIAGNOSIS — M549 Dorsalgia, unspecified: Secondary | ICD-10-CM | POA: Diagnosis present

## 2020-01-30 DIAGNOSIS — G8929 Other chronic pain: Secondary | ICD-10-CM | POA: Diagnosis present

## 2020-01-30 DIAGNOSIS — F329 Major depressive disorder, single episode, unspecified: Secondary | ICD-10-CM | POA: Diagnosis present

## 2020-01-30 DIAGNOSIS — R0602 Shortness of breath: Secondary | ICD-10-CM | POA: Diagnosis not present

## 2020-01-30 DIAGNOSIS — E11621 Type 2 diabetes mellitus with foot ulcer: Secondary | ICD-10-CM | POA: Diagnosis present

## 2020-01-30 DIAGNOSIS — F0390 Unspecified dementia without behavioral disturbance: Secondary | ICD-10-CM | POA: Diagnosis present

## 2020-01-30 DIAGNOSIS — Z888 Allergy status to other drugs, medicaments and biological substances status: Secondary | ICD-10-CM

## 2020-01-30 DIAGNOSIS — Z882 Allergy status to sulfonamides status: Secondary | ICD-10-CM

## 2020-01-30 DIAGNOSIS — Z881 Allergy status to other antibiotic agents status: Secondary | ICD-10-CM

## 2020-01-30 DIAGNOSIS — E875 Hyperkalemia: Secondary | ICD-10-CM | POA: Diagnosis present

## 2020-01-30 DIAGNOSIS — I272 Pulmonary hypertension, unspecified: Secondary | ICD-10-CM | POA: Diagnosis present

## 2020-01-30 DIAGNOSIS — I083 Combined rheumatic disorders of mitral, aortic and tricuspid valves: Secondary | ICD-10-CM | POA: Diagnosis present

## 2020-01-30 DIAGNOSIS — I447 Left bundle-branch block, unspecified: Secondary | ICD-10-CM | POA: Diagnosis present

## 2020-01-30 DIAGNOSIS — K219 Gastro-esophageal reflux disease without esophagitis: Secondary | ICD-10-CM | POA: Diagnosis present

## 2020-01-30 DIAGNOSIS — Z83438 Family history of other disorder of lipoprotein metabolism and other lipidemia: Secondary | ICD-10-CM

## 2020-01-30 DIAGNOSIS — E1151 Type 2 diabetes mellitus with diabetic peripheral angiopathy without gangrene: Secondary | ICD-10-CM | POA: Diagnosis present

## 2020-01-30 DIAGNOSIS — I130 Hypertensive heart and chronic kidney disease with heart failure and stage 1 through stage 4 chronic kidney disease, or unspecified chronic kidney disease: Secondary | ICD-10-CM | POA: Diagnosis present

## 2020-01-30 DIAGNOSIS — I252 Old myocardial infarction: Secondary | ICD-10-CM

## 2020-01-30 DIAGNOSIS — A4189 Other specified sepsis: Secondary | ICD-10-CM | POA: Diagnosis present

## 2020-01-30 DIAGNOSIS — J9601 Acute respiratory failure with hypoxia: Secondary | ICD-10-CM | POA: Diagnosis present

## 2020-01-30 DIAGNOSIS — Z880 Allergy status to penicillin: Secondary | ICD-10-CM

## 2020-01-30 DIAGNOSIS — L8990 Pressure ulcer of unspecified site, unspecified stage: Secondary | ICD-10-CM | POA: Insufficient documentation

## 2020-01-30 DIAGNOSIS — E1165 Type 2 diabetes mellitus with hyperglycemia: Secondary | ICD-10-CM | POA: Diagnosis present

## 2020-01-30 DIAGNOSIS — R6520 Severe sepsis without septic shock: Secondary | ICD-10-CM | POA: Diagnosis present

## 2020-01-30 DIAGNOSIS — Z79899 Other long term (current) drug therapy: Secondary | ICD-10-CM

## 2020-01-30 DIAGNOSIS — R7989 Other specified abnormal findings of blood chemistry: Secondary | ICD-10-CM | POA: Diagnosis present

## 2020-01-30 DIAGNOSIS — U071 COVID-19: Secondary | ICD-10-CM | POA: Diagnosis present

## 2020-01-30 DIAGNOSIS — Z833 Family history of diabetes mellitus: Secondary | ICD-10-CM

## 2020-01-30 DIAGNOSIS — M351 Other overlap syndromes: Secondary | ICD-10-CM | POA: Diagnosis present

## 2020-01-30 DIAGNOSIS — E872 Acidosis: Secondary | ICD-10-CM | POA: Diagnosis present

## 2020-01-30 DIAGNOSIS — Z7952 Long term (current) use of systemic steroids: Secondary | ICD-10-CM

## 2020-01-30 DIAGNOSIS — I5042 Chronic combined systolic (congestive) and diastolic (congestive) heart failure: Secondary | ICD-10-CM | POA: Diagnosis present

## 2020-01-30 DIAGNOSIS — A419 Sepsis, unspecified organism: Secondary | ICD-10-CM

## 2020-01-30 DIAGNOSIS — I2510 Atherosclerotic heart disease of native coronary artery without angina pectoris: Secondary | ICD-10-CM | POA: Diagnosis present

## 2020-01-30 DIAGNOSIS — Z87442 Personal history of urinary calculi: Secondary | ICD-10-CM

## 2020-01-30 DIAGNOSIS — I255 Ischemic cardiomyopathy: Secondary | ICD-10-CM | POA: Diagnosis present

## 2020-01-30 DIAGNOSIS — E1122 Type 2 diabetes mellitus with diabetic chronic kidney disease: Secondary | ICD-10-CM | POA: Diagnosis present

## 2020-01-30 DIAGNOSIS — Z9861 Coronary angioplasty status: Secondary | ICD-10-CM

## 2020-01-30 DIAGNOSIS — E1121 Type 2 diabetes mellitus with diabetic nephropathy: Secondary | ICD-10-CM | POA: Diagnosis not present

## 2020-01-30 DIAGNOSIS — G4733 Obstructive sleep apnea (adult) (pediatric): Secondary | ICD-10-CM | POA: Diagnosis present

## 2020-01-30 DIAGNOSIS — Z87891 Personal history of nicotine dependence: Secondary | ICD-10-CM

## 2020-01-30 DIAGNOSIS — Z886 Allergy status to analgesic agent status: Secondary | ICD-10-CM

## 2020-01-30 DIAGNOSIS — Y95 Nosocomial condition: Secondary | ICD-10-CM | POA: Diagnosis present

## 2020-01-30 DIAGNOSIS — Z8249 Family history of ischemic heart disease and other diseases of the circulatory system: Secondary | ICD-10-CM

## 2020-01-30 DIAGNOSIS — M797 Fibromyalgia: Secondary | ICD-10-CM | POA: Diagnosis present

## 2020-01-30 DIAGNOSIS — F419 Anxiety disorder, unspecified: Secondary | ICD-10-CM | POA: Diagnosis present

## 2020-01-30 LAB — COMPREHENSIVE METABOLIC PANEL
ALT: 38 U/L (ref 0–44)
AST: 41 U/L (ref 15–41)
Albumin: 3.4 g/dL — ABNORMAL LOW (ref 3.5–5.0)
Alkaline Phosphatase: 85 U/L (ref 38–126)
Anion gap: 16 — ABNORMAL HIGH (ref 5–15)
BUN: 49 mg/dL — ABNORMAL HIGH (ref 8–23)
CO2: 28 mmol/L (ref 22–32)
Calcium: 9.4 mg/dL (ref 8.9–10.3)
Chloride: 90 mmol/L — ABNORMAL LOW (ref 98–111)
Creatinine, Ser: 2.05 mg/dL — ABNORMAL HIGH (ref 0.44–1.00)
GFR calc Af Amer: 26 mL/min — ABNORMAL LOW (ref >60)
GFR calc non Af Amer: 22 mL/min — ABNORMAL LOW (ref >60)
Glucose, Bld: 147 mg/dL — ABNORMAL HIGH (ref 70–99)
Potassium: 4.5 mmol/L (ref 3.5–5.1)
Sodium: 134 mmol/L — ABNORMAL LOW (ref 135–145)
Total Bilirubin: 1.1 mg/dL (ref 0.3–1.2)
Total Protein: 7.4 g/dL (ref 6.5–8.1)

## 2020-01-30 LAB — CBC WITH DIFFERENTIAL/PLATELET
Abs Immature Granulocytes: 0.85 10*3/uL — ABNORMAL HIGH (ref 0.00–0.07)
Basophils Absolute: 0.1 10*3/uL (ref 0.0–0.1)
Basophils Relative: 0 %
Eosinophils Absolute: 0 10*3/uL (ref 0.0–0.5)
Eosinophils Relative: 0 %
HCT: 50.6 % — ABNORMAL HIGH (ref 36.0–46.0)
Hemoglobin: 16.3 g/dL — ABNORMAL HIGH (ref 12.0–15.0)
Immature Granulocytes: 4 %
Lymphocytes Relative: 2 %
Lymphs Abs: 0.5 10*3/uL — ABNORMAL LOW (ref 0.7–4.0)
MCH: 31.4 pg (ref 26.0–34.0)
MCHC: 32.2 g/dL (ref 30.0–36.0)
MCV: 97.5 fL (ref 80.0–100.0)
Monocytes Absolute: 1.4 10*3/uL — ABNORMAL HIGH (ref 0.1–1.0)
Monocytes Relative: 6 %
Neutro Abs: 21 10*3/uL — ABNORMAL HIGH (ref 1.7–7.7)
Neutrophils Relative %: 88 %
Platelets: 323 10*3/uL (ref 150–400)
RBC: 5.19 MIL/uL — ABNORMAL HIGH (ref 3.87–5.11)
RDW: 15.1 % (ref 11.5–15.5)
WBC: 23.8 10*3/uL — ABNORMAL HIGH (ref 4.0–10.5)
nRBC: 0.1 % (ref 0.0–0.2)

## 2020-01-30 LAB — TRIGLYCERIDES: Triglycerides: 186 mg/dL — ABNORMAL HIGH (ref <150)

## 2020-01-30 LAB — FERRITIN: Ferritin: 907 ng/mL — ABNORMAL HIGH (ref 11–307)

## 2020-01-30 LAB — I-STAT VENOUS BLOOD GAS, ED
Acid-Base Excess: 7 mmol/L — ABNORMAL HIGH (ref 0.0–2.0)
Bicarbonate: 33 mmol/L — ABNORMAL HIGH (ref 20.0–28.0)
Calcium, Ion: 1.01 mmol/L — ABNORMAL LOW (ref 1.15–1.40)
HCT: 52 % — ABNORMAL HIGH (ref 36.0–46.0)
Hemoglobin: 17.7 g/dL — ABNORMAL HIGH (ref 12.0–15.0)
O2 Saturation: 51 %
Potassium: 5.1 mmol/L (ref 3.5–5.1)
Sodium: 132 mmol/L — ABNORMAL LOW (ref 135–145)
TCO2: 34 mmol/L — ABNORMAL HIGH (ref 22–32)
pCO2, Ven: 48.8 mmHg (ref 44.0–60.0)
pH, Ven: 7.437 — ABNORMAL HIGH (ref 7.250–7.430)
pO2, Ven: 27 mmHg — CL (ref 32.0–45.0)

## 2020-01-30 LAB — TROPONIN I (HIGH SENSITIVITY)
Troponin I (High Sensitivity): 29 ng/L — ABNORMAL HIGH (ref <18)
Troponin I (High Sensitivity): 30 ng/L — ABNORMAL HIGH (ref <18)

## 2020-01-30 LAB — BRAIN NATRIURETIC PEPTIDE: B Natriuretic Peptide: 319.6 pg/mL — ABNORMAL HIGH (ref 0.0–100.0)

## 2020-01-30 LAB — LACTATE DEHYDROGENASE: LDH: 570 U/L — ABNORMAL HIGH (ref 98–192)

## 2020-01-30 LAB — C-REACTIVE PROTEIN: CRP: 9.4 mg/dL — ABNORMAL HIGH (ref <1.0)

## 2020-01-30 LAB — LACTIC ACID, PLASMA
Lactic Acid, Venous: 3 mmol/L (ref 0.5–1.9)
Lactic Acid, Venous: 2 mmol/L (ref 0.5–1.9)

## 2020-01-30 MED ORDER — SODIUM CHLORIDE 0.9 % IV SOLN
2.0000 g | Freq: Once | INTRAVENOUS | Status: AC
  Administered 2020-01-30: 2 g via INTRAVENOUS
  Filled 2020-01-30: qty 2

## 2020-01-30 MED ORDER — IPRATROPIUM-ALBUTEROL 20-100 MCG/ACT IN AERS
1.0000 | INHALATION_SPRAY | Freq: Four times a day (QID) | RESPIRATORY_TRACT | Status: DC
  Administered 2020-01-30 – 2020-01-31 (×5): 1 via RESPIRATORY_TRACT
  Filled 2020-01-30: qty 4

## 2020-01-30 MED ORDER — VANCOMYCIN HCL IN DEXTROSE 1-5 GM/200ML-% IV SOLN
1000.0000 mg | INTRAVENOUS | Status: DC
  Administered 2020-01-31 – 2020-02-01 (×2): 1000 mg via INTRAVENOUS
  Filled 2020-01-30 (×2): qty 200

## 2020-01-30 MED ORDER — PANTOPRAZOLE SODIUM 40 MG IV SOLR
40.0000 mg | Freq: Every day | INTRAVENOUS | Status: DC
  Administered 2020-01-30 – 2020-01-31 (×2): 40 mg via INTRAVENOUS
  Filled 2020-01-30 (×2): qty 40

## 2020-01-30 MED ORDER — ONDANSETRON HCL 4 MG/2ML IJ SOLN
4.0000 mg | Freq: Once | INTRAMUSCULAR | Status: AC
  Administered 2020-01-30: 4 mg via INTRAVENOUS

## 2020-01-30 MED ORDER — DOCUSATE SODIUM 100 MG PO CAPS
100.0000 mg | ORAL_CAPSULE | Freq: Two times a day (BID) | ORAL | Status: DC | PRN
  Administered 2020-02-04: 100 mg via ORAL

## 2020-01-30 MED ORDER — HEPARIN SODIUM (PORCINE) 5000 UNIT/ML IJ SOLN
5000.0000 [IU] | Freq: Three times a day (TID) | INTRAMUSCULAR | Status: DC
  Administered 2020-01-30 – 2020-01-31 (×2): 5000 [IU] via SUBCUTANEOUS
  Filled 2020-01-30 (×2): qty 1

## 2020-01-30 MED ORDER — POLYETHYLENE GLYCOL 3350 17 G PO PACK: 17.0000 g | PACK | Freq: Every day | ORAL | Status: DC | PRN

## 2020-01-30 MED ORDER — VANCOMYCIN HCL 1500 MG/300ML IV SOLN
1500.0000 mg | Freq: Once | INTRAVENOUS | Status: AC
  Administered 2020-01-30: 1500 mg via INTRAVENOUS
  Filled 2020-01-30 (×2): qty 300

## 2020-01-30 MED ORDER — METHYLPREDNISOLONE SODIUM SUCC 125 MG IJ SOLR
125.0000 mg | Freq: Once | INTRAMUSCULAR | Status: AC
  Administered 2020-01-30: 125 mg via INTRAVENOUS
  Filled 2020-01-30: qty 2

## 2020-01-30 MED ORDER — ONDANSETRON HCL 4 MG/2ML IJ SOLN
INTRAMUSCULAR | Status: AC
  Administered 2020-01-30: 4 mg via INTRAVENOUS
  Filled 2020-01-30: qty 2

## 2020-01-30 MED ORDER — SODIUM CHLORIDE 0.9 % IV SOLN
2.0000 g | INTRAVENOUS | Status: DC
  Administered 2020-01-31 – 2020-02-05 (×6): 2 g via INTRAVENOUS
  Filled 2020-01-30 (×6): qty 2

## 2020-01-30 NOTE — H&P (Addendum)
NAME:  Sydney Phillips, MRN:  [id number], DOB:  1935/12/17, LOS: 0 ADMISSION DATE:  01/30/2020, CONSULTATION DATE:  01/30/20 REFERRING MD:  Ronnald Nian  CHIEF COMPLAINT:  SOB   Brief History   Sydney Phillips is a 84 y.o. female with recent admission for COVID PNA, re-admitted 8/16 with AoC hypoxic respiratory failure.  Felt to be multifactorial due to recent COVID, probable aspiration, underlying COPD and CHF. Requiring NRB in ED and EDP concerned for possibly requiring intubation at some point.  History of present illness   Sydney Phillips is a 84 y.o. female who has a PMH significant for recent COVID PNA, PAD, GERD, diabetes, HTN, HLD, dementia, COPD,SKD stage III, combined systolic and diastolic CHF, and CAD who presented for complaints of AMS, increased weakness, dyspnea. Patient was recently admitted at this facility 8/5-8/9 for COVID PNA with positive COVID result 8/5. Prior to that hospitalization she was admitted 7/29-8/1 for decompensated heart failure.   On arrival to ED patient was seen hypoxic with SPO2 of 84 and hypertensive. There was concern for aspiration en route with questionable vomiting with EMS. ABG revealed 7.43 / 48.8 / 27 / 33. Other labs work significant for Na 134, BUN 49, Creatinine 2.05, anion gap 16, lactic 3.0, WBC 23.8, Hgb 16.3. CXR on arrival revealed new extensive patchy opacities consistent with COVID vs pulmonary edema. She was placed on a NRB and PCCM was called for admission and concern that she might possibly require intubation in the near future.  Past Medical History  has Hyperlipidemia LDL goal <70; Depression with anxiety; Hypertension; Coronary atherosclerosis; COPD (chronic obstructive pulmonary disease) ([place]); GERD; Chronic constipation; HIP PAIN, BILATERAL; SPINAL STENOSIS; Myalgia and myositis; Insomnia; WEIGHT GAIN; COLONIC POLYPS, HX OF; Vertigo; Hypokalemia; Implantable cardioverter-defibrillator (ICD) in situ; PVD,; S/P angioplasty with stent,  lt. subclavian 07/31/11; Microcytic anemia; NSTEMI (non-ST elevated myocardial infarction) ([place]); Melena; Left carotid stenosis; Arthralgia; Altered mental state; CAD S/P percutaneous coronary angioplasty; Somnolence; OSA and COPD overlap syndrome ([place]); Cardiomyopathy, ischemic; Ischemic mitral valve regurgitation; Pulmonary hypertension ([place]); Stenosis of left subclavian artery ([place]); Acute encephalopathy; CKD (chronic kidney disease), stage III; Leukocytosis; Steroid-induced psychosis, with hallucinations ([place]); Chronic respiratory failure with hypoxia ([place]); Tobacco abuse; Poor dentition; Acute exacerbation of CHF (congestive heart failure) ([employer]); Acute on chronic combined systolic and diastolic CHF (congestive heart failure) ([place]); Acute on chronic respiratory failure with hypoxia and hypercapnia ([place]); Rhinitis, chronic; Diabetic foot ulcers ([place]); LBBB (left bundle branch block); Colon polyp; Diastolic dysfunction; Hearing loss; Iron deficiency anemia; Perforation of left tympanic membrane; Other primary cardiomyopathies; Carotid artery disease ([place]); Type 2 diabetes mellitus with diabetic chronic kidney disease ([place]); V-tach ([place]); Acute on chronic congestive heart failure ([place]); Hyperlipemia; Fibromyalgia; Anxiety; ICD (implantable cardioverter-defibrillator) discharge; Abnormal thyroid function test; Fracture of left wrist; COVID-19; and Pneumonia due to COVID-19 virus on their problem list.  Significant Hospital Events   7/29 through 8/1 > admitted for CHF exac 8/4 through 8/9 > admitted for COVID PNA. 8/16 > readmitted for AoC hypoxic respiratory failure.  Consults:  Cards pending.  Procedures:  None.  Significant Diagnostic Tests:  CXR 8/16 > multifocal infiltrates, vascular congestion.  Micro Data:  Blood 8/16 >  Sputum 8/16 >   Antimicrobials:  Vanc 8/16 >  Cefepime 8/16 >  Interim history/subjective:    Objective:  Blood pressure (!) 178/93, pulse 76, temperature 99.5 F  (37.5 C), temperature source Tympanic, resp. rate (!) 38, SpO2 (!) 89 %.  No intake or output data in the 24 hours ending 01/30/20 1855 There were no vitals filed for this visit.  Examination: performed by MD General:  Neuro:  HEENT:  Cardiovascular:  Lungs:  Abdomen:  Musculoskeletal:  Skin:   Assessment & Plan:   Acute on chronic hypoxic respiratory failure - multifactorial in setting underlying COPD, recent COVID PNA, and now compounded by probable aspiration PNA.  Also some concern for vascular congestion / pulmonary edema on imaging 2/2 underlying CHF. Steroids and remdesivir courses completed (started 8/5) - Continue supplemental O2 as needed to maintain SpO2 > 85 given recent COVID (as long as has normal work of breathing with no distress). - Empiric cefepime, vancomcyin for aspiration/HAP - Consider diuresis - Aggressive mobilization / therapy as able. - Continue ongoing goals of care discussions in case she declines to the point of having to consider intubation (of note, this is her 3rd admission this month). - Combivent inhaler, hold off on steroids for now, just finished a course of decadron.   Anion gap acidosis: lactic - Ensure lactic clearing.  - trend bmp  Hx sCHF, CAD, PAD, VT s/p ICD. - Hold home ASA, plavix, amio, bisoprolol, torsemide for now until taking PO - Ensure K > 4, Mg > 2. - Consider cardiology consult.  Hx CKD. - Supportive care - Trend BMP  Hx DM (diet controlled). - SSI.  Hx hypothyroidism. - Continue synthroid.  Hx nondisplaced left wrist fracture. - F/u with ortho as outpatient (Dr. Jeannie Fend).   Best Practice:  Diet: NPO. Pain/Anxiety/Delirium protocol (if indicated): N/A. VAP protocol (if indicated): N/A. DVT prophylaxis: SCD's / Heparin. GI prophylaxis: N/A. Glucose control: SSI. Mobility: Bedrest. Code Status: Full - needs ongoing family discussions. Family Communication:  Disposition: ICU.  Labs   CBC: Recent  Labs  Lab 01/30/20 1723 01/30/20 1726  WBC 23.8*  --   NEUTROABS 21.0*  --   HGB 16.3* 17.7*  HCT 50.6* 52.0*  MCV 97.5  --   PLT 323  --    Basic Metabolic Panel: Recent Labs  Lab 01/30/20 1723 01/30/20 1726  NA 134* 132*  K 4.5 5.1  CL 90*  --   CO2 28  --   GLUCOSE 147*  --   BUN 49*  --   CREATININE 2.05*  --   CALCIUM 9.4  --    GFR: Estimated Creatinine Clearance: 22.2 mL/min (A) (by C-G formula based on SCr of 2.05 mg/dL (H)). Recent Labs  Lab 01/30/20 1723  WBC 23.8*  LATICACIDVEN 3.0*   Liver Function Tests: Recent Labs  Lab 01/30/20 1723  AST 41  ALT 38  ALKPHOS 85  BILITOT 1.1  PROT 7.4  ALBUMIN 3.4*   No results for input(s): LIPASE, AMYLASE in the last 168 hours. No results for input(s): AMMONIA in the last 168 hours. ABG    Component Value Date/Time   PHART 7.432 12/05/2019 1343   PCO2ART 53.9 (H) 12/05/2019 1343   PO2ART 94 12/05/2019 1343   HCO3 33.0 (H) 01/30/2020 1726   TCO2 34 (H) 01/30/2020 1726   ACIDBASEDEF 6.0 (H) 04/06/2016 1242   O2SAT 51.0 01/30/2020 1726    Coagulation Profile: No results for input(s): INR, PROTIME in the last 168 hours. Cardiac Enzymes: No results for input(s): CKTOTAL, CKMB, CKMBINDEX, TROPONINI in the last 168 hours. HbA1C: Hgb A1c MFr Bld  Date/Time Value Ref Range Status  01/14/2020 02:31 AM 6.7 (H) 4.8 - 5.6 % Final    Comment:    (  NOTE) Pre diabetes:          5.7%-6.4%  Diabetes:              >6.4%  Glycemic control for   <7.0% adults with diabetes   03/09/2019 04:12 PM 7.2 (H) 4.6 - 6.5 % Final    Comment:    Glycemic Control Guidelines for People with Diabetes:Non Diabetic:  <6%Goal of Therapy: <7%Additional Action Suggested:  >8%    CBG: No results for input(s): GLUCAP in the last 168 hours.  Review of Systems:   Unable as patient is encephalopthic  Past medical history  She,  has a past medical history of Arthritis, CAD (coronary artery disease), Cardiomyopathy ([place]), Chronic  back pain, Chronic combined systolic and diastolic CHF (congestive heart failure) ([place]), CKD (chronic kidney disease), stage III, Colon polyps (2003.  2015.), COPD (chronic obstructive pulmonary disease) ([place]), Dementia ([place]), Depression with anxiety, Diabetes mellitus ([place]), Early cataracts, bilateral, Fibromyalgia, GERD (gastroesophageal reflux disease), Hemorrhoids, History of kidney stones, HOH (hard of hearing), colonic polyps, Hyperlipemia, Hypertension, ICD (implantable cardioverter-defibrillator) discharge (01/12/2020), Insomnia, LBBB (left bundle branch block), PAD (peripheral artery disease) ([place]), Presence of combination internal cardiac defibrillator (ICD) and pacemaker, Pulmonary hypertension ([place]), Subclavian arterial stenosis, lt, with PTA/STENT 07/31/11 (08/01/2011), and Vertigo.   Surgical History    Past Surgical History:  Procedure Laterality Date  . ABDOMINAL AORTAGRAM N/A 08/12/2013   Procedure: ABDOMINAL Maxcine Ham;  Surgeon: Elam Dutch, MD;  Location: [facility] CATH LAB;  Service: Cardiovascular;  Laterality: N/A;  . ABDOMINAL HYSTERECTOMY    . APPENDECTOMY    . BACK SURGERY  2012  . BIV ICD GENERTAOR CHANGE OUT Left 02/20/2012   Procedure: BIV ICD GENERTAOR CHANGE OUT;  Surgeon: [name], MD;  Location: [facility] CATH LAB;  Service: Cardiovascular;  Laterality: Left;  . CARDIAC CATHETERIZATION  12/01/2007   By Dr. Melvern Banker, left heart cath,   . CARDIAC CATHETERIZATION N/A 05/20/2016   Procedure: Right/Left Heart Cath and Coronary Angiography;  Surgeon: Sherren Mocha, MD;  Location: [place] CV LAB;  Service: Cardiovascular;  Laterality: N/A;  . CARDIAC CATHETERIZATION N/A 05/20/2016   Procedure: Coronary Stent Intervention;  Surgeon: Sherren Mocha, MD;  Location: [place] CV LAB;  Service: Cardiovascular;  Laterality: N/A;  . CARDIAC DEFIBRILLATOR PLACEMENT  05/2008   By Dr Blanch Media, [employer] CANNOT HAVE MRI's  . CAROTID ANGIOGRAM N/A 07/31/2011   Procedure: CAROTID ANGIOGRAM;   Surgeon: Lorretta Harp, MD;  Location: [facility] CATH LAB;  Service: Cardiovascular;  Laterality: N/A;  carotid angiogram and possible Lt SCA PTA  . COLONOSCOPY W/ POLYPECTOMY  12/2013  . CORONARY ANGIOPLASTY    . ENDARTERECTOMY Left 11/08/2014   Procedure: LEFT CAROTID ENDARTERECTOMY WITH HEMASHIELD PATCH ANGIOPLASTY;  Surgeon: Elam Dutch, MD;  Location: [facility];  Service: Vascular;  Laterality: Left;  . ESOPHAGOGASTRODUODENOSCOPY N/A 01/20/2014   Procedure: ESOPHAGOGASTRODUODENOSCOPY (EGD);  Surgeon: Jerene Bears, MD;  Location: [facility] ENDOSCOPY;  Service: Endoscopy;  Laterality: N/A;  . FEMORAL-POPLITEAL BYPASS GRAFT Right 10/12/2013   Procedure:   Femoral-Peroneal trunk  bypass with nonreversed greater saphenous vein graft;  Surgeon: Elam Dutch, MD;  Location: [place];  Service: Vascular;  Laterality: Right;  . GIVENS CAPSULE STUDY N/A 01/20/2014   Procedure: GIVENS CAPSULE STUDY;  Surgeon: Jerene Bears, MD;  Location: [place];  Service: Gastroenterology;  Laterality: N/A;  . ICD GENERATOR CHANGEOUT N/A 03/04/2017   Procedure: ICD Generator Changeout;  Surgeon: [name], MD;  Location: [place] CV LAB;  Service: Cardiovascular;  Laterality: N/A;  . INSERT / REPLACE / REMOVE PACEMAKER    . INTRAOPERATIVE ARTERIOGRAM Right 10/12/2013   Procedure: INTRA OPERATIVE ARTERIOGRAM;  Surgeon: Elam Dutch, MD;  Location: [place];  Service: Vascular;  Laterality: Right;  . LEFT HEART CATH AND CORONARY ANGIOGRAPHY N/A 10/24/2019   Procedure: LEFT HEART CATH AND CORONARY ANGIOGRAPHY;  Surgeon: Belva Crome, MD;  Location: [place] CV LAB;  Service: Cardiovascular;  Laterality: N/A;  . ORIF ELBOW FRACTURE  08/16/2011   Procedure: OPEN REDUCTION INTERNAL FIXATION (ORIF) ELBOW/OLECRANON FRACTURE;  Surgeon: Schuyler Amor, MD;  Location: [place];  Service: Orthopedics;  Laterality: Left;  . RENAL ANGIOGRAM N/A 08/12/2013   Procedure: RENAL ANGIOGRAM;  Surgeon: Elam Dutch, MD;  Location[facility]  CATH LAB;  Service: Cardiovascular;  Laterality: N/A;  . SUBCLAVIAN STENT PLACEMENT Left 07/31/2011   7x18 Genesis, balloon, with reduction of 90% ostial left subclavian artery stenosis to 0% with residual excellent flow  . TONSILLECTOMY    . TUBAL LIGATION       Social History   reports that she quit smoking about 2 years ago. Her smoking use included cigarettes. She has a 31.00 pack-year smoking history. She has never used smokeless tobacco. She reports that she does not drink alcohol and does not use drugs.   Family history   Her family history includes CAD in her father; Cancer in her sister; Colon cancer in her maternal grandmother; Deep vein thrombosis in her son; Diabetes in her sister; Heart disease in her father, mother, and sister; Hyperlipidemia in her father and another family member. There is no history of Anesthesia problems, Hypotension, Malignant hyperthermia, or Pseudochol deficiency.   Allergies Allergies  Allergen Reactions  . Potassium-Containing Compounds Other (See Comments)    Causes severe constipation  . Neomycin-Polymyxin B Gu     Swollen throat   . Other Other (See Comments)    "mycin" antibiotics cause sleeplessness and itching  . Azithromycin Rash  . Codeine Itching  . Darvon Itching  . Erythromycin Rash  . Meloxicam Other (See Comments)    Unknown    . Norco [Hydrocodone-Acetaminophen] Itching  . Penicillins Rash and Other (See Comments)    Has patient had a PCN reaction causing immediate rash, facial/tongue/throat swelling, SOB or lightheadedness with hypotension: unknown Has patient had a PCN reaction causing severe rash involving mucus membranes or skin necrosis: no Has patient had a PCN reaction that required hospitalization: unknown Has patient had a PCN reaction occurring within the last 10 years: no If all of the above answers are "NO", then may proceed with Cephalosporin use.   Marland Kitchen Propoxyphene N-Acetaminophen Itching  . Rofecoxib Other (See  Comments)    Unknown    . Rosuvastatin Other (See Comments)    cramps  . Statins Itching and Other (See Comments)    Sleeplessness Sleeplessness  . Sulfa Antibiotics Rash     Home meds  Prior to Admission medications   Medication Sig Start Date End Date Taking? Authorizing Provider  ACCU-CHEK FASTCLIX LANCETS MISC USE   TO CHECK GLUCOSE ONCE DAILY Patient taking differently: 1 each by Other route daily.  07/27/18   Laurey Morale, MD  ACCU-CHEK SMARTVIEW test strip USE  STRIP TO CHECK GLUCOSE ONCE DAILY Patient taking differently: 1 each by Other route daily.  06/15/19   Laurey Morale, MD  albuterol (PROVENTIL) (2.5 MG/3ML) 0.083% nebulizer solution USE 1 VIAL IN NEBULIZER EVERY 4 HOURS AS NEEDED FOR  WHEEZING FOR SHORTNESS OF BREATH Patient taking differently: Take 2.5 mg by nebulization every 4 (four) hours as needed for wheezing or shortness of breath.  05/18/19   Laurey Morale, MD  allopurinol (ZYLOPRIM) 300 MG tablet Take 1 tablet by mouth once daily Patient taking differently: Take 300 mg by mouth daily.  11/22/19   Laurey Morale, MD  ALPRAZolam Duanne Moron) 1 MG tablet TAKE 1 TABLET BY MOUTH IN THE MORNING AND 1 & 1/2 (ONE & ONE-HALF) NIGHTLY AS NEEDED FOR ANXIETY Patient taking differently: Take 1-1.5 mg by mouth See admin instructions. Take 1 tablet (1mg ) by mouth daily in the morning and 1 and 1/2 tablet (1.5mg ) by mough every night as needed for anxiety. 11/22/19   Laurey Morale, MD  amiodarone (PACERONE) 200 MG tablet Take 1 tablet (200 mg total) by mouth 2 (two) times daily. 10/26/19   Shirley Friar, PA-C  Ascorbic Acid (VITAMIN C PO) Take 1 tablet by mouth in the morning and at bedtime.    [provider]  aspirin 81 MG chewable tablet Chew 1 tablet (81 mg total) by mouth daily. 05/23/16   Reyne Dumas, MD  azelastine (ASTELIN) 0.1 % nasal spray USE 2 SPRAY(S) IN EACH NOSTRIL TWICE DAILY AS DIRECTED Patient taking differently: Place 2 sprays into both nostrils 2  (two) times daily.  08/08/19   Laurey Morale, MD  bisoprolol (ZEBETA) 5 MG tablet Take 1 tablet (5 mg total) by mouth daily. 12/09/19   Eugenie Filler, MD  Cholecalciferol (VITAMIN D) 50 MCG (2000 UT) CAPS Take 4,000 Units by mouth daily.     [provider]  clopidogrel (PLAVIX) 75 MG tablet Take 1 tablet by mouth once daily Patient taking differently: Take 75 mg by mouth daily.  06/30/19   Laurey Morale, MD  Cyanocobalamin 5000 MCG TBDP Take 5,000 mcg by mouth daily.     [provider]  dexamethasone (DECADRON) 6 MG tablet Take 1 tablet (6 mg total) by mouth daily. 01/23/20   Ghimire, Henreitta Leber, MD  DULoxetine (CYMBALTA) 60 MG capsule Take 1 capsule by mouth twice daily 01/30/20   Laurey Morale, MD  loratadine (CLARITIN) 10 MG tablet Take 10 mg by mouth daily.    [provider]  magnesium oxide (MAG-OX) 400 (241.3 Mg) MG tablet Take 1 tablet (400 mg total) by mouth daily. 01/24/20   Ghimire, Henreitta Leber, MD  nitroGLYCERIN (NITROSTAT) 0.4 MG SL tablet Place 1 tablet (0.4 mg total) under the tongue every 5 (five) minutes as needed for chest pain. X 3 doses Patient taking differently: Place 0.4 mg under the tongue every 5 (five) minutes x 3 doses as needed for chest pain.  11/02/19   Shirley Friar, PA-C  pantoprazole (PROTONIX) 40 MG tablet Take 1 tablet (40 mg total) by mouth daily at 12 noon. 01/23/20   Ghimire, Henreitta Leber, MD  potassium chloride SA (KLOR-CON) 20 MEQ tablet Take 1 tablet (20 mEq total) by mouth daily. 01/16/20   Fay Records, MD  PROAIR HFA 108 [phone] Base) MCG/ACT inhaler INHALE 2 PUFFS BY MOUTH EVERY 4 HOURS AS NEEDED FOR WHEEZING AND FOR SHORTNESS OF BREATH Patient taking differently: Inhale 2 puffs into the lungs every 4 (four) hours as needed for wheezing or shortness of breath.  12/02/19   Laurey Morale, MD  torsemide (DEMADEX) 20 MG tablet Take 4 tablets (80 mg total) by mouth 2 (two) times daily. 12/21/19   Laurey Morale,  MD    Critical care  time:     Georgann Housekeeper, AGACNP-BC [place] for personal pager PCCM on call pager [phone]  01/30/2020 7:30 PM

## 2020-01-30 NOTE — Progress Notes (Signed)
Pharmacy Antibiotic Note  Sydney Phillips is a 84 y.o. female admitted on 01/30/2020 with sepsis.  Pharmacy has been consulted for vancomycin and cefepime dosing. Pt with Tmax 100 and WBC is elevated at 23.8. Scr is also elevated slightly above baseline. Lactic acid is 3.   Plan: Vancomycin 1500mg  IV x 1 then 1gm IV Q24H Cefepime 2gm IV Q24H F/u renal fxn, C&S, clinical status and trough at SS   Temp (24hrs), Avg:99 F (37.2 C), Min:99 F (37.2 C), Max:99 F (37.2 C)  Recent Labs  Lab 01/30/20 1723  WBC 23.8*  CREATININE 2.05*  LATICACIDVEN 3.0*    Estimated Creatinine Clearance: 22.2 mL/min (A) (by C-G formula based on SCr of 2.05 mg/dL (H)).    Allergies  Allergen Reactions  . Potassium-Containing Compounds Other (See Comments)    Causes severe constipation  . Neomycin-Polymyxin B Gu     Swollen throat   . Other Other (See Comments)    "mycin" antibiotics cause sleeplessness and itching  . Azithromycin Rash  . Codeine Itching  . Darvon Itching  . Erythromycin Rash  . Meloxicam Other (See Comments)    Unknown    . Norco [Hydrocodone-Acetaminophen] Itching  . Penicillins Rash and Other (See Comments)    Has patient had a PCN reaction causing immediate rash, facial/tongue/throat swelling, SOB or lightheadedness with hypotension: unknown Has patient had a PCN reaction causing severe rash involving mucus membranes or skin necrosis: no Has patient had a PCN reaction that required hospitalization: unknown Has patient had a PCN reaction occurring within the last 10 years: no If all of the above answers are "NO", then may proceed with Cephalosporin use.   Marland Kitchen Propoxyphene N-Acetaminophen Itching  . Rofecoxib Other (See Comments)    Unknown    . Rosuvastatin Other (See Comments)    cramps  . Statins Itching and Other (See Comments)    Sleeplessness Sleeplessness  . Sulfa Antibiotics Rash    Antimicrobials this admission: Vanc 8/16>> Cefepime 8/16>>  Dose  adjustments this admission: N/A  Microbiology results: Pending  Thank you for allowing pharmacy to be a part of this patient's care.  Dewel Lotter, Rande Lawman 01/30/2020 6:24 PM

## 2020-01-30 NOTE — ED Triage Notes (Signed)
Pt transported from home by EMS with c/o increasing weakness, pt recently d/c post [place] admission.

## 2020-01-30 NOTE — ED Provider Notes (Signed)
[facility] EMERGENCY DEPARTMENT Provider Note   CSN: [id number] Arrival date & time: 01/30/20  1637     History Chief Complaint  Patient presents with  . Altered Mental Status    covid +    Sydney Phillips is a 84 y.o. female.  The history is provided by the patient.  Altered Mental Status Presenting symptoms: lethargy   Severity:  Moderate Most recent episode:  Yesterday Episode history:  Continuous Timing:  Constant Progression:  Worsening Chronicity:  New Context: recent illness and recent infection (admitted last week for COVID)   Associated symptoms: nausea, vomiting and weakness   Associated symptoms: no abdominal pain, no difficulty breathing, no fever, no palpitations, no rash and no seizures        Past Medical History:  Diagnosis Date  . Arthritis   . CAD (coronary artery disease)    a. s/p DES to LCx/RCA 05/2016, ostial LAD disease.  . Cardiomyopathy ([place])   . Chronic back pain   . Chronic combined systolic and diastolic CHF (congestive heart failure) ([place])   . CKD (chronic kidney disease), stage III   . Colon polyps 2003.  2015.   HP polyps 2003.  adnomas 2015.  required referal to [facility] for colonoscopic resection of flat polyps.   Marland Kitchen COPD (chronic obstructive pulmonary disease) ([place])   . Dementia ([place])   . Depression with anxiety    takes Cymbalta daily  . Diabetes mellitus ([place])   . Early cataracts, bilateral   . Fibromyalgia   . GERD (gastroesophageal reflux disease)    was on meds but was taken off;now watches what she eats  . Hemorrhoids   . History of kidney stones   . HOH (hard of hearing)   . Hx of colonic polyps   . Hyperlipemia    takes Crestor daily  . Hypertension    takes Amlodipine and Metoprolol daily  . ICD (implantable cardioverter-defibrillator) discharge 01/12/2020  . Insomnia   . LBBB (left bundle branch block)    Stress test 09/03/2010, EF 55  . PAD (peripheral artery disease) (HCC)    Carotid,  subclavian, and lower extremity beds, currently not symptomatic  . Presence of combination internal cardiac defibrillator (ICD) and pacemaker   . Pulmonary hypertension ([place])   . Subclavian arterial stenosis, lt, with PTA/STENT 07/31/11 08/01/2011  . Vertigo    takes Meclizine prn    Patient Active Problem List   Diagnosis Date Noted  . Acute hypoxemic respiratory failure ([place]) 01/30/2020  . Pneumonia due to COVID-19 virus 01/20/2020  . COVID-19 01/19/2020  . Abnormal thyroid function test 01/15/2020  . Fracture of left wrist 01/15/2020  . ICD (implantable cardioverter-defibrillator) discharge 01/12/2020  . Hyperlipemia   . Fibromyalgia   . Anxiety   . Acute on chronic congestive heart failure ([place]) 12/05/2019  . V-tach ([place]) 10/22/2019  . Type 2 diabetes mellitus with diabetic chronic kidney disease ([place]) 01/05/2019  . Diabetic foot ulcers ([place]) 06/18/2018  . Rhinitis, chronic 09/29/2017  . Hearing loss 09/04/2017  . Perforation of left tympanic membrane 09/04/2017  . Acute exacerbation of CHF (congestive heart failure) ([place]) 07/19/2017  . Acute on chronic combined systolic and diastolic CHF (congestive heart failure) ([place]) 07/19/2017  . Acute on chronic respiratory failure with hypoxia and hypercapnia ([place]) 07/19/2017  . Poor dentition 04/01/2017  . Tobacco abuse 12/17/2016  . 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 Chronic respiratory failure with hypoxia ([place])   . Acute encephalopathy 05/30/2016  .  CKD (chronic kidney disease), stage III 05/30/2016  . Leukocytosis   . Stenosis of left subclavian artery ([place]) 05/19/2016  . Somnolence   . OSA and COPD overlap syndrome ([place])   . Cardiomyopathy, ischemic   . Ischemic mitral valve regurgitation   . Pulmonary hypertension ([place])   . CAD S/P percutaneous coronary angioplasty   . Altered mental state 02/26/2016  . Arthralgia 10/12/2015  . Left carotid stenosis 11/08/2014  . Iron deficiency anemia 04/17/2014  .  Carotid artery disease ([place]) 04/17/2014  . LBBB (left bundle branch block) 04/16/2014  . Diastolic dysfunction 36/14/4315  . NSTEMI (non-ST elevated myocardial infarction) ([place]) 01/20/2014  . Melena 01/20/2014  . Microcytic anemia 01/19/2014  . Colon polyp 03/24/2013  . S/P angioplasty with stent, lt. subclavian 07/31/11 08/01/2011  . Implantable cardioverter-defibrillator (ICD) in situ 07/28/2011  . PVD, 07/28/2011  . Vertigo 07/27/2011  . Hypokalemia 07/27/2011  . SPINAL STENOSIS 01/28/2010  . Chronic constipation 01/14/2010  . Insomnia 01/23/2009  . HIP PAIN, BILATERAL 06/21/2008  . Coronary atherosclerosis 03/30/2008  . Other primary cardiomyopathies 03/30/2008  . Myalgia and myositis 11/26/2007  . Hyperlipidemia LDL goal <70 07/28/2007  . WEIGHT GAIN 06/04/2007  . COPD (chronic obstructive pulmonary disease) ([place]) 03/02/2007  . Depression with anxiety 02/25/2007  . Hypertension 02/25/2007  . GERD 02/25/2007  . COLONIC POLYPS, HX OF 02/25/2007    Past Surgical History:  Procedure Laterality Date  . ABDOMINAL AORTAGRAM N/A 08/12/2013   Procedure: ABDOMINAL Maxcine Ham;  Surgeon: Elam Dutch, MD;  Location: [facility] CATH LAB;  Service: Cardiovascular;  Laterality: N/A;  . ABDOMINAL HYSTERECTOMY    . APPENDECTOMY    . BACK SURGERY  2012  . BIV ICD GENERTAOR CHANGE OUT Left 02/20/2012   Procedure: BIV ICD GENERTAOR CHANGE OUT;  Surgeon: [name], MD;  Location: [facility] CATH LAB;  Service: Cardiovascular;  Laterality: Left;  . CARDIAC CATHETERIZATION  12/01/2007   By Dr. Melvern Banker, left heart cath,   . CARDIAC CATHETERIZATION N/A 05/20/2016   Procedure: Right/Left Heart Cath and Coronary Angiography;  Surgeon: Sherren Mocha, MD;  Location: [facility] CV LAB;  Service: Cardiovascular;  Laterality: N/A;  . CARDIAC CATHETERIZATION N/A 05/20/2016   Procedure: Coronary Stent Intervention;  Surgeon: Sherren Mocha, MD;  Location: [facility] CV LAB;  Service: Cardiovascular;  Laterality:  N/A;  . CARDIAC DEFIBRILLATOR PLACEMENT  05/2008   By Dr Blanch Media, [employer] CANNOT HAVE MRI's  . CAROTID ANGIOGRAM N/A 07/31/2011   Procedure: CAROTID ANGIOGRAM;  Surgeon: Lorretta Harp, MD;  Location: [facility] CATH LAB;  Service: Cardiovascular;  Laterality: N/A;  carotid angiogram and possible Lt SCA PTA  . COLONOSCOPY W/ POLYPECTOMY  12/2013  . CORONARY ANGIOPLASTY    . ENDARTERECTOMY Left 11/08/2014   Procedure: LEFT CAROTID ENDARTERECTOMY WITH HEMASHIELD PATCH ANGIOPLASTY;  Surgeon: Elam Dutch, MD;  Location: [facility];  Service: Vascular;  Laterality: Left;  . ESOPHAGOGASTRODUODENOSCOPY N/A 01/20/2014   Procedure: ESOPHAGOGASTRODUODENOSCOPY (EGD);  Surgeon: Jerene Bears, MD;  Location[facility] ENDOSCOPY;  Service: Endoscopy;  Laterality: N/A;  . FEMORAL-POPLITEAL BYPASS GRAFT Right 10/12/2013   Procedure:   Femoral-Peroneal trunk  bypass with nonreversed greater saphenous vein graft;  Surgeon: Elam Dutch, MD;  Location: [facility];  Service: Vascular;  Laterality: Right;  . GIVENS CAPSULE STUDY N/A 01/20/2014   Procedure: GIVENS CAPSULE STUDY;  Surgeon: Jerene Bears, MD;  Location: [facility];  Service: Gastroenterology;  Laterality: N/A;  . ICD GENERATOR CHANGEOUT N/A 03/04/2017   Procedure: ICD Generator  Changeout;  Surgeon: [name], MD;  Location: [facility] CV LAB;  Service: Cardiovascular;  Laterality: N/A;  . INSERT / REPLACE / REMOVE PACEMAKER    . INTRAOPERATIVE ARTERIOGRAM Right 10/12/2013   Procedure: INTRA OPERATIVE ARTERIOGRAM;  Surgeon: Elam Dutch, MD;  Location: [facility];  Service: Vascular;  Laterality: Right;  . LEFT HEART CATH AND CORONARY ANGIOGRAPHY N/A 10/24/2019   Procedure: LEFT HEART CATH AND CORONARY ANGIOGRAPHY;  Surgeon: Belva Crome, MD;  Location: [facility] CV LAB;  Service: Cardiovascular;  Laterality: N/A;  . ORIF ELBOW FRACTURE  08/16/2011   Procedure: OPEN REDUCTION INTERNAL FIXATION (ORIF) ELBOW/OLECRANON FRACTURE;  Surgeon: Schuyler Amor, MD;   Location: [facility];  Service: Orthopedics;  Laterality: Left;  . RENAL ANGIOGRAM N/A 08/12/2013   Procedure: RENAL ANGIOGRAM;  Surgeon: Elam Dutch, MD;  Location: [facility] CATH LAB;  Service: Cardiovascular;  Laterality: N/A;  . SUBCLAVIAN STENT PLACEMENT Left 07/31/2011   7x18 Genesis, balloon, with reduction of 90% ostial left subclavian artery stenosis to 0% with residual excellent flow  . TONSILLECTOMY    . TUBAL LIGATION       OB History   No obstetric history on file.     Family History  Problem Relation Age of Onset  . CAD Father   . Heart disease Father   . Hyperlipidemia Father   . Heart disease Mother   . Deep vein thrombosis Son   . Hyperlipidemia Other   . Colon cancer Maternal Grandmother   . Cancer Sister        ovarian  . Diabetes Sister   . Heart disease Sister   . Anesthesia problems Neg Hx   . Hypotension Neg Hx   . Malignant hyperthermia Neg Hx   . Pseudochol deficiency Neg Hx     Social History   Tobacco Use  . Smoking status: Former Smoker    Packs/day: 0.50    Years: 62.00    Pack years: 31.00    Types: Cigarettes    Quit date: 07/19/2017    Years since quitting: 2.5  . Smokeless tobacco: Never Used  Vaping Use  . Vaping Use: Never used  Substance Use Topics  . Alcohol use: No    Alcohol/week: 0.0 standard drinks  . Drug use: No    Home Medications Prior to Admission medications   Medication Sig Start Date End Date Taking? Authorizing Provider  ACCU-CHEK FASTCLIX LANCETS MISC USE   TO CHECK GLUCOSE ONCE DAILY Patient taking differently: 1 each by Other route daily.  07/27/18   Laurey Morale, MD  ACCU-CHEK SMARTVIEW test strip USE  STRIP TO CHECK GLUCOSE ONCE DAILY Patient taking differently: 1 each by Other route daily.  06/15/19   Laurey Morale, MD  albuterol (PROVENTIL) (2.5 MG/3ML) 0.083% nebulizer solution USE 1 VIAL IN NEBULIZER EVERY 4 HOURS AS NEEDED FOR WHEEZING FOR SHORTNESS OF BREATH Patient taking differently: Take 2.5 mg by  nebulization every 4 (four) hours as needed for wheezing or shortness of breath.  05/18/19   Laurey Morale, MD  allopurinol (ZYLOPRIM) 300 MG tablet Take 1 tablet by mouth once daily Patient taking differently: Take 300 mg by mouth daily.  11/22/19   Laurey Morale, MD  ALPRAZolam Duanne Moron) 1 MG tablet TAKE 1 TABLET BY MOUTH IN THE MORNING AND 1 & 1/2 (ONE & ONE-HALF) NIGHTLY AS NEEDED FOR ANXIETY Patient taking differently: Take 1-1.5 mg by mouth See admin instructions. Take 1 tablet (1mg ) by mouth  daily in the morning and 1 and 1/2 tablet (1.5mg ) by mough every night as needed for anxiety. 11/22/19   Laurey Morale, MD  amiodarone (PACERONE) 200 MG tablet Take 1 tablet (200 mg total) by mouth 2 (two) times daily. 10/26/19   Shirley Friar, PA-C  Ascorbic Acid (VITAMIN C PO) Take 1 tablet by mouth in the morning and at bedtime.    [provider]  aspirin 81 MG chewable tablet Chew 1 tablet (81 mg total) by mouth daily. 05/23/16   Reyne Dumas, MD  azelastine (ASTELIN) 0.1 % nasal spray USE 2 SPRAY(S) IN EACH NOSTRIL TWICE DAILY AS DIRECTED Patient taking differently: Place 2 sprays into both nostrils 2 (two) times daily.  08/08/19   Laurey Morale, MD  bisoprolol (ZEBETA) 5 MG tablet Take 1 tablet (5 mg total) by mouth daily. 12/09/19   Eugenie Filler, MD  Cholecalciferol (VITAMIN D) 50 MCG (2000 UT) CAPS Take 4,000 Units by mouth daily.     [provider]  clopidogrel (PLAVIX) 75 MG tablet Take 1 tablet by mouth once daily Patient taking differently: Take 75 mg by mouth daily.  06/30/19   Laurey Morale, MD  Cyanocobalamin 5000 MCG TBDP Take 5,000 mcg by mouth daily.     [provider]  dexamethasone (DECADRON) 6 MG tablet Take 1 tablet (6 mg total) by mouth daily. 01/23/20   Ghimire, Henreitta Leber, MD  DULoxetine (CYMBALTA) 60 MG capsule Take 1 capsule by mouth twice daily 01/30/20   Laurey Morale, MD  loratadine (CLARITIN) 10 MG tablet Take 10 mg by mouth daily.     [provider]  magnesium oxide (MAG-OX) 400 (241.3 Mg) MG tablet Take 1 tablet (400 mg total) by mouth daily. 01/24/20   Ghimire, Henreitta Leber, MD  magnesium oxide (MAG-OX) 400 MG tablet Take 1 tablet by mouth daily. 01/23/20   [provider]  nitroGLYCERIN (NITROSTAT) 0.4 MG SL tablet Place 1 tablet (0.4 mg total) under the tongue every 5 (five) minutes as needed for chest pain. X 3 doses Patient taking differently: Place 0.4 mg under the tongue every 5 (five) minutes x 3 doses as needed for chest pain.  11/02/19   Shirley Friar, PA-C  pantoprazole (PROTONIX) 40 MG tablet Take 1 tablet (40 mg total) by mouth daily at 12 noon. 01/23/20   Ghimire, Henreitta Leber, MD  potassium chloride SA (KLOR-CON) 20 MEQ tablet Take 1 tablet (20 mEq total) by mouth daily. 01/16/20   Fay Records, MD  PROAIR HFA 108 [phone] Base) MCG/ACT inhaler INHALE 2 PUFFS BY MOUTH EVERY 4 HOURS AS NEEDED FOR WHEEZING AND FOR SHORTNESS OF BREATH Patient taking differently: Inhale 2 puffs into the lungs every 4 (four) hours as needed for wheezing or shortness of breath.  12/02/19   Laurey Morale, MD  torsemide (DEMADEX) 20 MG tablet Take 4 tablets (80 mg total) by mouth 2 (two) times daily. 12/21/19   Laurey Morale, MD    Allergies    Potassium-containing compounds, Neomycin-polymyxin b gu, Other, Azithromycin, Codeine, Darvon, Erythromycin, Meloxicam, Norco [hydrocodone-acetaminophen], Penicillins, Propoxyphene n-acetaminophen, Rofecoxib, Rosuvastatin, Statins, and Sulfa antibiotics  Review of Systems   Review of Systems  Constitutional: Negative for chills and fever.  HENT: Negative for ear pain and sore throat.   Eyes: Negative for pain and visual disturbance.  Respiratory: Positive for cough and shortness of breath.   Cardiovascular: Negative for chest pain and palpitations.  Gastrointestinal: Positive for nausea  and vomiting. Negative for abdominal pain.  Genitourinary: Negative for dysuria and hematuria.    Musculoskeletal: Negative for arthralgias and back pain.  Skin: Negative for color change and rash.  Neurological: Positive for weakness. Negative for seizures and syncope.  All other systems reviewed and are negative.   Physical Exam Updated Vital Signs  ED Triage Vitals  Enc Vitals Group     BP 01/30/20 1647 (!) 162/129     Pulse Rate 01/30/20 1647 77     Resp 01/30/20 1647 20     Temp 01/30/20 1647 99 F (37.2 C)     Temp Source 01/30/20 1647 Oral     SpO2 01/30/20 1640 90 %     Weight --      Height --      Head Circumference --      Peak Flow --      Pain Score --      Pain Loc --      Pain Edu? --      Excl. in [place]? --     Physical Exam Vitals and nursing note reviewed.  Constitutional:      General: She is in acute distress.     Appearance: She is well-developed. She is ill-appearing.  HENT:     Head: Normocephalic and atraumatic.     Mouth/Throat:     Mouth: Mucous membranes are moist.  Eyes:     Extraocular Movements: Extraocular movements intact.     Conjunctiva/sclera: Conjunctivae normal.     Pupils: Pupils are equal, round, and reactive to light.  Cardiovascular:     Rate and Rhythm: Regular rhythm. Tachycardia present.     Heart sounds: No murmur heard.   Pulmonary:     Effort: No respiratory distress.     Breath sounds: Wheezing present.     Comments: Increased work of breathing  Abdominal:     Palpations: Abdomen is soft.     Tenderness: There is no abdominal tenderness.  Musculoskeletal:        General: No tenderness. Normal range of motion.     Cervical back: Neck supple.  Skin:    General: Skin is warm and dry.     Capillary Refill: Capillary refill takes less than 2 seconds.  Neurological:     General: No focal deficit present.     Mental Status: She is alert.     Sensory: No sensory deficit.     Motor: No weakness.  Psychiatric:        Mood and Affect: Mood normal.     ED Results / Procedures / Treatments   Labs (all labs  ordered are listed, but only abnormal results are displayed) Labs Reviewed  LACTIC ACID, PLASMA - Abnormal; Notable for the following components:      Result Value   Lactic Acid, Venous 3.0 (*)    All other components within normal limits  CBC WITH DIFFERENTIAL/PLATELET - Abnormal; Notable for the following components:   WBC 23.8 (*)    RBC 5.19 (*)    Hemoglobin 16.3 (*)    HCT 50.6 (*)    Neutro Abs 21.0 (*)    Lymphs Abs 0.5 (*)    Monocytes Absolute 1.4 (*)    Abs Immature Granulocytes 0.85 (*)    All other components within normal limits  COMPREHENSIVE METABOLIC PANEL - Abnormal; Notable for the following components:   Sodium 134 (*)    Chloride 90 (*)    Glucose, Bld 147 (*)  BUN 49 (*)    Creatinine, Ser 2.05 (*)    Albumin 3.4 (*)    GFR calc non Af Amer 22 (*)    GFR calc Af Amer 26 (*)    Anion gap 16 (*)    All other components within normal limits  LACTATE DEHYDROGENASE - Abnormal; Notable for the following components:   LDH 570 (*)    All other components within normal limits  FERRITIN - Abnormal; Notable for the following components:   Ferritin 907 (*)    All other components within normal limits  C-REACTIVE PROTEIN - Abnormal; Notable for the following components:   CRP 9.4 (*)    All other components within normal limits  BRAIN NATRIURETIC PEPTIDE - Abnormal; Notable for the following components:   B Natriuretic Peptide 319.6 (*)    All other components within normal limits  TRIGLYCERIDES - Abnormal; Notable for the following components:   Triglycerides 186 (*)    All other components within normal limits  I-STAT VENOUS BLOOD GAS, ED - Abnormal; Notable for the following components:   pH, Ven 7.437 (*)    pO2, Ven 27.0 (*)    Bicarbonate 33.0 (*)    TCO2 34 (*)    Acid-Base Excess 7.0 (*)    Sodium 132 (*)    Calcium, Ion 1.01 (*)    HCT 52.0 (*)    Hemoglobin 17.7 (*)    All other components within normal limits  TROPONIN I (HIGH SENSITIVITY) -  Abnormal; Notable for the following components:   Troponin I (High Sensitivity) 29 (*)    All other components within normal limits  CULTURE, BLOOD (ROUTINE X 2)  CULTURE, BLOOD (ROUTINE X 2)  LACTIC ACID, PLASMA  TRIGLYCERIDES  D-DIMER, QUANTITATIVE (NOT AT [facility])  FIBRINOGEN  CBC  BASIC METABOLIC PANEL  MAGNESIUM  PHOSPHORUS  TROPONIN I (HIGH SENSITIVITY)    EKG EKG Interpretation  Date/Time:  Monday January 30 2020 17:09:26 EDT Ventricular Rate:  74 PR Interval:    QRS Duration: 191 QT Interval:  475 QTC Calculation: 528 R Axis:   10 Text Interpretation: Sinus rhythm IVCD, consider atypical LBBB No significant change since last tracing Confirmed by Lennice Sites [phone]) on 01/30/2020 5:26:58 PM   Radiology DG Chest Port 1 View  Result Date: 01/30/2020 CLINICAL DATA:  COVID-19 positive EXAM: PORTABLE CHEST 1 VIEW COMPARISON:  01/26/2020 chest radiograph. FINDINGS: Stable 3 lead left subclavian ICD. Stable cardiomediastinal silhouette with moderate cardiomegaly. No pneumothorax. No pleural effusion. Extensive patchy opacities throughout both lungs are new. IMPRESSION: New extensive patchy opacities throughout both lungs, differential includes COVID-19 pneumonia and/or cardiogenic pulmonary edema given moderate cardiomegaly. Electronically Signed   By: Ilona Sorrel M.D.   On: 01/30/2020 18:07    Procedures .Critical Care Performed by: Lennice Sites, DO Authorized by: Lennice Sites, DO   Critical care provider statement:    Critical care time (minutes):  73   Critical care was necessary to treat or prevent imminent or life-threatening deterioration of the following conditions:  Respiratory failure   Critical care was time spent personally by me on the following activities:  Blood draw for specimens, development of treatment plan with patient or surrogate, discussions with primary provider, evaluation of patient's response to treatment, examination of patient, obtaining  history from patient or surrogate, ordering and review of laboratory studies, ordering and performing treatments and interventions, ordering and review of radiographic studies, re-evaluation of patient's condition, pulse oximetry and review of old charts  I assumed direction of critical care for this patient from another provider in my specialty: no     (including critical care time)  Medications Ordered in ED Medications  vancomycin (VANCOREADY) IVPB 1500 mg/300 mL (has no administration in time range)  ceFEPIme (MAXIPIME) 2 g in sodium chloride 0.9 % 100 mL IVPB (has no administration in time range)  vancomycin (VANCOCIN) IVPB 1000 mg/200 mL premix (has no administration in time range)  polyethylene glycol (MIRALAX / GLYCOLAX) packet 17 g (has no administration in time range)  heparin injection 5,000 Units (has no administration in time range)  docusate sodium (COLACE) capsule 100 mg (has no administration in time range)  pantoprazole (PROTONIX) injection 40 mg (has no administration in time range)  Ipratropium-Albuterol (COMBIVENT) respimat 1 puff (has no administration in time range)  ondansetron (ZOFRAN) injection 4 mg (4 mg Intravenous Given by Other 01/30/20 1856)  methylPREDNISolone sodium succinate (SOLU-MEDROL) 125 mg/2 mL injection 125 mg (125 mg Intravenous Given 01/30/20 1808)  ceFEPIme (MAXIPIME) 2 g in sodium chloride 0.9 % 100 mL IVPB (2 g Intravenous New Bag/Given 01/30/20 1917)    ED Course  I have reviewed the triage vital signs and the nursing notes.  Pertinent labs & imaging results that were available during my care of the patient were reviewed by me and considered in my medical decision making (see chart for details).    MDM Rules/Calculators/A&P                          SHAVONTE ZHAO is an 84 year old female with history of COPD, heart failure, CAD who presents to the ED with shortness of breath.  Patient recently discharged after treated for coronavirus however  is gotten worse over the last several days.  Now arrives needing increased oxygen requirement.  On 15 L to maintain saturations above 90%.  Mentation however is good as patient can talk and answer questions and follow commands.  Patient is full code per her son who is power of attorney.  Has coarse breath sounds throughout.  Appears to be tachypneic, temperature is 100.  Chest x-ray shows increased airspace opacities likely ongoing Covid but could be a bacterial infection and will give IV antibiotics.  White count is 23.  Lactic acid is 3.  However at this time will hold on fluids given heart failure history and want to keep the patient is dry as possible.  Blood gas showed a pH of 7.4, CO2 of 48.  Hemoglobin 16.  All inflammatory markers are elevated.  Overall suspect sepsis from coronavirus.  Was given IV steroids.  Being admitted to ICU team given oxygen requirement and work of breathing.  This chart was dictated using voice recognition software.  Despite best efforts to proofread,  errors can occur which can change the documentation meaning.    Final Clinical Impression(s) / ED Diagnoses Final diagnoses:  Acute respiratory failure with hypoxia ([place])  COVID-19  Sepsis, due to unspecified organism, unspecified whether acute organ dysfunction present [facility])    Rx / DC Orders ED Discharge Orders    None       Lennice Sites, DO 01/30/20 1950

## 2020-01-30 NOTE — Telephone Encounter (Signed)
Gabriel Carina (Son-on DPR) stated his mother just got out of the hospital and he thought he was going to be able to assist her however he is not able. He would like for his PCP to refer her to a rehab facility.   [name] can be reached at [phone]

## 2020-01-31 ENCOUNTER — Ambulatory Visit: Payer: Self-pay

## 2020-01-31 ENCOUNTER — Inpatient Hospital Stay (HOSPITAL_COMMUNITY): Payer: Medicare Other

## 2020-01-31 DIAGNOSIS — R0602 Shortness of breath: Secondary | ICD-10-CM | POA: Diagnosis not present

## 2020-01-31 DIAGNOSIS — U071 COVID-19: Secondary | ICD-10-CM | POA: Diagnosis not present

## 2020-01-31 DIAGNOSIS — J80 Acute respiratory distress syndrome: Secondary | ICD-10-CM | POA: Diagnosis not present

## 2020-01-31 DIAGNOSIS — I340 Nonrheumatic mitral (valve) insufficiency: Secondary | ICD-10-CM | POA: Diagnosis not present

## 2020-01-31 DIAGNOSIS — J9601 Acute respiratory failure with hypoxia: Secondary | ICD-10-CM | POA: Diagnosis not present

## 2020-01-31 LAB — BASIC METABOLIC PANEL
Anion gap: 14 (ref 5–15)
BUN: 50 mg/dL — ABNORMAL HIGH (ref 8–23)
CO2: 25 mmol/L (ref 22–32)
Calcium: 9 mg/dL (ref 8.9–10.3)
Chloride: 95 mmol/L — ABNORMAL LOW (ref 98–111)
Creatinine, Ser: 1.96 mg/dL — ABNORMAL HIGH (ref 0.44–1.00)
GFR calc Af Amer: 27 mL/min — ABNORMAL LOW (ref >60)
GFR calc non Af Amer: 23 mL/min — ABNORMAL LOW (ref >60)
Glucose, Bld: 253 mg/dL — ABNORMAL HIGH (ref 70–99)
Potassium: 4.4 mmol/L (ref 3.5–5.1)
Sodium: 134 mmol/L — ABNORMAL LOW (ref 135–145)

## 2020-01-31 LAB — CBC
HCT: 45.5 % (ref 36.0–46.0)
HCT: 46.9 % — ABNORMAL HIGH (ref 36.0–46.0)
Hemoglobin: 14.5 g/dL (ref 12.0–15.0)
Hemoglobin: 15 g/dL (ref 12.0–15.0)
MCH: 31.3 pg (ref 26.0–34.0)
MCH: 31.1 pg (ref 26.0–34.0)
MCHC: 31.9 g/dL (ref 30.0–36.0)
MCHC: 32 g/dL (ref 30.0–36.0)
MCV: 98.1 fL (ref 80.0–100.0)
MCV: 97.1 fL (ref 80.0–100.0)
Platelets: 204 10*3/uL (ref 150–400)
Platelets: 214 10*3/uL (ref 150–400)
RBC: 4.64 MIL/uL (ref 3.87–5.11)
RBC: 4.83 MIL/uL (ref 3.87–5.11)
RDW: 15.3 % (ref 11.5–15.5)
RDW: 15.2 % (ref 11.5–15.5)
WBC: 12.9 10*3/uL — ABNORMAL HIGH (ref 4.0–10.5)
WBC: 14.7 10*3/uL — ABNORMAL HIGH (ref 4.0–10.5)
nRBC: 0 % (ref 0.0–0.2)
nRBC: 0 % (ref 0.0–0.2)

## 2020-01-31 LAB — ECHOCARDIOGRAM LIMITED
Area-P 1/2: 3.65 cm2
Calc EF: 28.6 %
MV M vel: 5.56 m/s
MV Peak grad: 123.7 mmHg
Radius: 0.4 cm
S' Lateral: 5 cm
Single Plane A2C EF: 21.2 %
Single Plane A4C EF: 36.7 %

## 2020-01-31 LAB — PHOSPHORUS: Phosphorus: 4.4 mg/dL (ref 2.5–4.6)

## 2020-01-31 LAB — CBG MONITORING, ED: Glucose-Capillary: 267 mg/dL — ABNORMAL HIGH (ref 70–99)

## 2020-01-31 LAB — FIBRINOGEN: Fibrinogen: 687 mg/dL — ABNORMAL HIGH (ref 210–475)

## 2020-01-31 LAB — LACTIC ACID, PLASMA
Lactic Acid, Venous: 1.7 mmol/L (ref 0.5–1.9)
Lactic Acid, Venous: 1.6 mmol/L (ref 0.5–1.9)

## 2020-01-31 LAB — CREATININE, SERUM
Creatinine, Ser: 1.98 mg/dL — ABNORMAL HIGH (ref 0.44–1.00)
GFR calc Af Amer: 27 mL/min — ABNORMAL LOW (ref >60)
GFR calc non Af Amer: 23 mL/min — ABNORMAL LOW (ref >60)

## 2020-01-31 LAB — MAGNESIUM: Magnesium: 1.9 mg/dL (ref 1.7–2.4)

## 2020-01-31 LAB — D-DIMER, QUANTITATIVE: D-Dimer, Quant: 3.61 ug/mL-FEU — ABNORMAL HIGH (ref 0.00–0.50)

## 2020-01-31 MED ORDER — AMIODARONE HCL 200 MG PO TABS
200.0000 mg | ORAL_TABLET | Freq: Two times a day (BID) | ORAL | Status: DC
  Administered 2020-01-31 – 2020-02-04 (×9): 200 mg via ORAL
  Filled 2020-01-31 (×10): qty 1

## 2020-01-31 MED ORDER — BISOPROLOL FUMARATE 5 MG PO TABS
5.0000 mg | ORAL_TABLET | Freq: Every day | ORAL | Status: DC
  Administered 2020-01-31 – 2020-02-10 (×11): 5 mg via ORAL
  Filled 2020-01-31 (×11): qty 1

## 2020-01-31 MED ORDER — LORATADINE 10 MG PO TABS
10.0000 mg | ORAL_TABLET | Freq: Every day | ORAL | Status: DC
  Administered 2020-01-31 – 2020-02-10 (×11): 10 mg via ORAL
  Filled 2020-01-31 (×11): qty 1

## 2020-01-31 MED ORDER — SPIRONOLACTONE 12.5 MG HALF TABLET
12.5000 mg | ORAL_TABLET | Freq: Every day | ORAL | Status: DC
  Administered 2020-01-31 – 2020-02-04 (×5): 12.5 mg via ORAL
  Filled 2020-01-31 (×5): qty 1

## 2020-01-31 MED ORDER — LACTATED RINGERS IV BOLUS
500.0000 mL | Freq: Once | INTRAVENOUS | Status: AC
  Administered 2020-01-31: 500 mL via INTRAVENOUS

## 2020-01-31 MED ORDER — DEXAMETHASONE 4 MG PO TABS
6.0000 mg | ORAL_TABLET | Freq: Every day | ORAL | Status: DC
  Administered 2020-01-31: 6 mg via ORAL
  Filled 2020-01-31: qty 2

## 2020-01-31 MED ORDER — VITAMIN B-12 1000 MCG PO TABS
2000.0000 ug | ORAL_TABLET | Freq: Every day | ORAL | Status: DC
  Administered 2020-01-31 – 2020-02-10 (×11): 2000 ug via ORAL
  Filled 2020-01-31 (×11): qty 2

## 2020-01-31 MED ORDER — ENOXAPARIN SODIUM 30 MG/0.3ML ~~LOC~~ SOLN
30.0000 mg | SUBCUTANEOUS | Status: DC
  Administered 2020-01-31 – 2020-02-09 (×10): 30 mg via SUBCUTANEOUS
  Filled 2020-01-31 (×10): qty 0.3

## 2020-01-31 MED ORDER — POTASSIUM CHLORIDE CRYS ER 20 MEQ PO TBCR
20.0000 meq | EXTENDED_RELEASE_TABLET | Freq: Every day | ORAL | Status: DC
  Administered 2020-01-31 – 2020-02-04 (×5): 20 meq via ORAL
  Filled 2020-01-31 (×5): qty 1

## 2020-01-31 MED ORDER — MAGNESIUM OXIDE 400 (241.3 MG) MG PO TABS
400.0000 mg | ORAL_TABLET | Freq: Every day | ORAL | Status: DC
  Administered 2020-01-31 – 2020-02-06 (×7): 400 mg via ORAL
  Filled 2020-01-31 (×7): qty 1

## 2020-01-31 MED ORDER — PERFLUTREN LIPID MICROSPHERE
1.0000 mL | INTRAVENOUS | Status: AC | PRN
  Administered 2020-01-31: 2 mL via INTRAVENOUS
  Filled 2020-01-31: qty 10

## 2020-01-31 MED ORDER — ASCORBIC ACID 500 MG PO TABS
250.0000 mg | ORAL_TABLET | Freq: Every day | ORAL | Status: DC
  Administered 2020-01-31 – 2020-02-10 (×11): 250 mg via ORAL
  Filled 2020-01-31 (×11): qty 1

## 2020-01-31 MED ORDER — POLYETHYLENE GLYCOL 3350 17 G PO PACK: 17.0000 g | PACK | Freq: Every day | ORAL | Status: DC | PRN

## 2020-01-31 MED ORDER — METHYLPREDNISOLONE SODIUM SUCC 125 MG IJ SOLR
80.0000 mg | Freq: Two times a day (BID) | INTRAMUSCULAR | Status: DC
  Administered 2020-01-31 – 2020-02-04 (×9): 80 mg via INTRAVENOUS
  Filled 2020-01-31 (×9): qty 2

## 2020-01-31 MED ORDER — CHLORHEXIDINE GLUCONATE CLOTH 2 % EX PADS
6.0000 | MEDICATED_PAD | Freq: Every day | CUTANEOUS | Status: DC
  Administered 2020-01-31 – 2020-02-04 (×3): 6 via TOPICAL

## 2020-01-31 MED ORDER — ASPIRIN 81 MG PO CHEW
81.0000 mg | CHEWABLE_TABLET | Freq: Every day | ORAL | Status: DC
  Administered 2020-01-31 – 2020-02-10 (×11): 81 mg via ORAL
  Filled 2020-01-31 (×11): qty 1

## 2020-01-31 MED ORDER — DOCUSATE SODIUM 100 MG PO CAPS
100.0000 mg | ORAL_CAPSULE | Freq: Two times a day (BID) | ORAL | Status: DC | PRN
  Filled 2020-01-31: qty 1

## 2020-01-31 MED ORDER — VITAMIN D 25 MCG (1000 UNIT) PO TABS
4000.0000 [IU] | ORAL_TABLET | Freq: Every day | ORAL | Status: DC
  Administered 2020-01-31 – 2020-02-10 (×11): 4000 [IU] via ORAL
  Filled 2020-01-31 (×11): qty 4

## 2020-01-31 MED ORDER — PANTOPRAZOLE SODIUM 40 MG PO TBEC
40.0000 mg | DELAYED_RELEASE_TABLET | Freq: Every day | ORAL | Status: DC
  Administered 2020-01-31 – 2020-02-06 (×6): 40 mg via ORAL
  Filled 2020-01-31 (×7): qty 1

## 2020-01-31 MED ORDER — ALPRAZOLAM 0.5 MG PO TABS
0.5000 mg | ORAL_TABLET | Freq: Three times a day (TID) | ORAL | Status: DC | PRN
  Administered 2020-01-31 – 2020-02-09 (×8): 0.5 mg via ORAL
  Filled 2020-01-31 (×9): qty 1

## 2020-01-31 MED ORDER — CLOPIDOGREL BISULFATE 75 MG PO TABS
75.0000 mg | ORAL_TABLET | Freq: Every day | ORAL | Status: DC
  Administered 2020-01-31 – 2020-02-10 (×11): 75 mg via ORAL
  Filled 2020-01-31 (×11): qty 1

## 2020-01-31 MED ORDER — ALLOPURINOL 300 MG PO TABS
300.0000 mg | ORAL_TABLET | Freq: Every day | ORAL | Status: DC
  Administered 2020-01-31 – 2020-02-08 (×9): 300 mg via ORAL
  Filled 2020-01-31 (×9): qty 1

## 2020-01-31 MED ORDER — DULOXETINE HCL 60 MG PO CPEP
60.0000 mg | ORAL_CAPSULE | Freq: Two times a day (BID) | ORAL | Status: DC
  Administered 2020-01-31 – 2020-02-10 (×21): 60 mg via ORAL
  Filled 2020-01-31 (×22): qty 1

## 2020-01-31 MED ORDER — TORSEMIDE 20 MG PO TABS
80.0000 mg | ORAL_TABLET | Freq: Two times a day (BID) | ORAL | Status: DC
  Administered 2020-01-31 – 2020-02-04 (×8): 80 mg via ORAL
  Filled 2020-01-31 (×11): qty 4

## 2020-01-31 NOTE — Telephone Encounter (Signed)
Spoke with the patients sone. He stated that Sydney Phillips is back in the hospital with Covid and her doctors do not seem optimistic. Will send to Dr. Sarajane Jews as Juluis Rainier.

## 2020-01-31 NOTE — ED Notes (Signed)
Family Updated.

## 2020-01-31 NOTE — Progress Notes (Signed)
NAME:  Sydney Phillips, MRN:  [id number], DOB:  01/11/1936, LOS: 1 ADMISSION DATE:  01/30/2020, CONSULTATION DATE:  8/16 REFERRING MD:  EDP, CHIEF COMPLAINT:  dyspnea   Brief History   84 y/o female with a complex medical history including COVID pneumonia diagnosed on 8/5 and treated through 8/9 as an inpatient in [facility] returned to [facility] on 8/16 with confusion, dyspnea and weakness.  She was found to have profound acute hypoxemic respiratory failure presumably due to worsening COVID 19 pneumonia.   Past Medical History  CAD COPD on 2 L [place] GERD CAD Ischemic cardiomyopathy, ICD/CRT treatment Pulmonary hypertension Microcytic anemia CKD IIIa Tobacco abuse Diabetic foot ulcers Hyperlipidemia Hypertension VTach fracture of left wrist  Significant Hospital Events   7/29 through 8/1 > admitted for CHF exac/frequent ICD shocks 8/4 through 8/9 > admitted for COVID PNA. 8/16 > readmitted for AoC hypoxic respiratory failure.  Consults:  Cardiology  Procedures:    Significant Diagnostic Tests:    Micro Data:  Blood 8/16 >  Sputum 8/16 >   Antimicrobials:  Vanc 8/16 >  Cefepime 8/16 >  Interim history/subjective:  Feels about the same this morning Denies dyspnea lethargic  Objective   Blood pressure 123/62, pulse 72, temperature 98.5 F (36.9 C), temperature source Oral, resp. rate 15, SpO2 92 %.       No intake or output data in the 24 hours ending 01/31/20 0820 There were no vitals filed for this visit.  Examination:  General: chronically ill appearing, resting comfortably in bed HENT: NCAT OP clear PULM: wheezing worse in R base, crackles left base, normal effort CV: RRR, no mgr GI: BS+, soft, nontender MSK: normal bulk and tone Neuro:lethargic, wakes to voice, oriented to hospital but not able to carry on lengthy conversation or hold attention when asked about goals of care, moves all four extremities well  8/17 CXR > emphysema,  cardiomegally, ICD in place, compared to 8/17, there is progression of bilateral diffuse interstitial infiltrates  Resolved Hospital Problem list     Assessment & Plan:  Acute on chronic respiratory failure with hypoxemia: the most likely etiology is progressive fibrotic change related to COVID 19 (organizing pneumonia) however ddx includes bacterial superinfection with a nosocomial organism or acute pulmonary edema. Overall prognosis is poor given the patient's advanced age and multiple comorbid illnesses > admit to ICU > continue vanc/cefepime > start solumedrol, high dose > high flow nasal cannula/NRB > DNR status, confirmed with son  Chronic systolic heart failure CAD VT, ICD in place Pulmonary hypertension Tele Amiodarone Restart home b-blocker Restart home spironolactone and torsemide  CKD stage IIIa Monitor BMET and UOP Replace electrolytes as needed  COPD on home O2 Solumedrol Scheduled combivent respimat (no nebs with COVID)  Wrist fracture PT consult when appropriate  Gout Allopurinol  Dementia Frequent orientation   Best practice:  Diet: regular Pain/Anxiety/Delirium protocol (if indicated): n/a VAP protocol (if indicated): n/a DVT prophylaxis: lovenox GI prophylaxis: n/a Glucose control: monitor Mobility: out of bed as tolerated Code Status: DNR Family Communication: I spoke to her son at length today and explained that she would not do well with the intervention of mechanical ventilation or CPR for respiratory or cardiac arrest. He agreed that her code status should be DNR but we will continue full medical support otherwise. Disposition: remain in ICU  Labs   CBC: Recent Labs  Lab 01/30/20 1723 01/30/20 1726 01/31/20 0404  WBC 23.8*  --  12.9*  NEUTROABS  21.0*  --   --   HGB 16.3* 17.7* 14.5  HCT 50.6* 52.0* 45.5  MCV 97.5  --  98.1  PLT 323  --  035    Basic Metabolic Panel: Recent Labs  Lab 01/30/20 1723 01/30/20 1726  01/31/20 0404  NA 134* 132* 134*  K 4.5 5.1 4.4  CL 90*  --  95*  CO2 28  --  25  GLUCOSE 147*  --  253*  BUN 49*  --  50*  CREATININE 2.05*  --  1.96*  CALCIUM 9.4  --  9.0  MG  --   --  1.9  PHOS  --   --  4.4   GFR: Estimated Creatinine Clearance: 23.3 mL/min (A) (by C-G formula based on SCr of 1.96 mg/dL (H)). Recent Labs  Lab 01/30/20 1723 01/30/20 1945 01/31/20 0234 01/31/20 0352 01/31/20 0404  WBC 23.8*  --   --   --  12.9*  LATICACIDVEN 3.0* 2.0* 1.7 1.6  --     Liver Function Tests: Recent Labs  Lab 01/30/20 1723  AST 41  ALT 38  ALKPHOS 85  BILITOT 1.1  PROT 7.4  ALBUMIN 3.4*   No results for input(s): LIPASE, AMYLASE in the last 168 hours. No results for input(s): AMMONIA in the last 168 hours.  ABG    Component Value Date/Time   PHART 7.432 12/05/2019 1343   PCO2ART 53.9 (H) 12/05/2019 1343   PO2ART 94 12/05/2019 1343   HCO3 33.0 (H) 01/30/2020 1726   TCO2 34 (H) 01/30/2020 1726   ACIDBASEDEF 6.0 (H) 04/06/2016 1242   O2SAT 51.0 01/30/2020 1726     Coagulation Profile: No results for input(s): INR, PROTIME in the last 168 hours.  Cardiac Enzymes: No results for input(s): CKTOTAL, CKMB, CKMBINDEX, TROPONINI in the last 168 hours.  HbA1C: Hgb A1c MFr Bld  Date/Time Value Ref Range Status  01/14/2020 02:31 AM 6.7 (H) 4.8 - 5.6 % Final    Comment:    (NOTE) Pre diabetes:          5.7%-6.4%  Diabetes:              >6.4%  Glycemic control for   <7.0% adults with diabetes   03/09/2019 04:12 PM 7.2 (H) 4.6 - 6.5 % Final    Comment:    Glycemic Control Guidelines for People with Diabetes:Non Diabetic:  <6%Goal of Therapy: <7%Additional Action Suggested:  >8%     CBG: Recent Labs  Lab 01/31/20 0146  GLUCAP 267*     Critical care time: 40 minutes    Roselie Awkward, MD [place] P[facility]M Pager: [phone] Cell: [phone] If no response, call [phone]

## 2020-01-31 NOTE — ED Notes (Signed)
Dinner ordered 

## 2020-01-31 NOTE — ED Notes (Signed)
Please call the son [name] [phone] with an update

## 2020-01-31 NOTE — Progress Notes (Signed)
Inpatient Diabetes Program Recommendations  AACE/ADA: New Consensus Statement on Inpatient Glycemic Control (2015)  Target Ranges:  Prepandial:   less than 140 mg/dL      Peak postprandial:   less than 180 mg/dL (1-2 hours)      Critically ill patients:  140 - 180 mg/dL   Results for KOBE, JANSMA (MRN [id number]) as of 01/31/2020 07:41  Ref. Range 01/31/2020 01:46  Glucose-Capillary Latest Ref Range: 70 - 99 mg/dL 267 (H)    Admit with: Sepsis secondary to COVID-19 pneumonia  Recent admission for COVID from 08/04 through 08/09    History: DM, CHF, COPD, CKD, Dementia  Home DM Meds: None--Diet controlled  Current Insulin Orders: None yet    MD- Please consider placing orders for Novolog Moderate Correction Scale/ SSI (0-15 units) Q4 hours     --Will follow patient during hospitalization--  Wyn Quaker RN, MSN, CDE Diabetes Coordinator Inpatient Glycemic Control Team Team Pager: [phone] (8a-5p)

## 2020-01-31 NOTE — Telephone Encounter (Signed)
I will need an OV with her to talk about options

## 2020-01-31 NOTE — Progress Notes (Signed)
  Echocardiogram 2D Echocardiogram limited with definity has been performed.  Darlina Sicilian M 01/31/2020, 12:14 PM

## 2020-02-01 DIAGNOSIS — U071 COVID-19: Secondary | ICD-10-CM | POA: Diagnosis not present

## 2020-02-01 DIAGNOSIS — J80 Acute respiratory distress syndrome: Secondary | ICD-10-CM | POA: Diagnosis not present

## 2020-02-01 DIAGNOSIS — J9601 Acute respiratory failure with hypoxia: Secondary | ICD-10-CM | POA: Diagnosis not present

## 2020-02-01 DIAGNOSIS — L8990 Pressure ulcer of unspecified site, unspecified stage: Secondary | ICD-10-CM | POA: Insufficient documentation

## 2020-02-01 LAB — BASIC METABOLIC PANEL
Anion gap: 13 (ref 5–15)
BUN: 58 mg/dL — ABNORMAL HIGH (ref 8–23)
CO2: 25 mmol/L (ref 22–32)
Calcium: 9 mg/dL (ref 8.9–10.3)
Chloride: 96 mmol/L — ABNORMAL LOW (ref 98–111)
Creatinine, Ser: 1.86 mg/dL — ABNORMAL HIGH (ref 0.44–1.00)
GFR calc Af Amer: 29 mL/min — ABNORMAL LOW (ref >60)
GFR calc non Af Amer: 25 mL/min — ABNORMAL LOW (ref >60)
Glucose, Bld: 205 mg/dL — ABNORMAL HIGH (ref 70–99)
Potassium: 4.2 mmol/L (ref 3.5–5.1)
Sodium: 134 mmol/L — ABNORMAL LOW (ref 135–145)

## 2020-02-01 LAB — CBC
HCT: 44.9 % (ref 36.0–46.0)
Hemoglobin: 14.6 g/dL (ref 12.0–15.0)
MCH: 31.5 pg (ref 26.0–34.0)
MCHC: 32.5 g/dL (ref 30.0–36.0)
MCV: 96.8 fL (ref 80.0–100.0)
Platelets: 206 10*3/uL (ref 150–400)
RBC: 4.64 MIL/uL (ref 3.87–5.11)
RDW: 15.1 % (ref 11.5–15.5)
WBC: 21.5 10*3/uL — ABNORMAL HIGH (ref 4.0–10.5)
nRBC: 0 % (ref 0.0–0.2)

## 2020-02-01 LAB — MAGNESIUM: Magnesium: 1.9 mg/dL (ref 1.7–2.4)

## 2020-02-01 LAB — GLUCOSE, CAPILLARY: Glucose-Capillary: 269 mg/dL — ABNORMAL HIGH (ref 70–99)

## 2020-02-01 LAB — PHOSPHORUS: Phosphorus: 3.5 mg/dL (ref 2.5–4.6)

## 2020-02-01 MED ORDER — IPRATROPIUM-ALBUTEROL 20-100 MCG/ACT IN AERS
1.0000 | INHALATION_SPRAY | Freq: Three times a day (TID) | RESPIRATORY_TRACT | Status: DC
  Administered 2020-02-01 – 2020-02-10 (×27): 1 via RESPIRATORY_TRACT

## 2020-02-01 MED ORDER — INSULIN ASPART 100 UNIT/ML ~~LOC~~ SOLN
0.0000 [IU] | Freq: Three times a day (TID) | SUBCUTANEOUS | Status: DC
  Administered 2020-02-01 – 2020-02-02 (×2): 8 [IU] via SUBCUTANEOUS
  Administered 2020-02-02: 3 [IU] via SUBCUTANEOUS
  Administered 2020-02-02: 8 [IU] via SUBCUTANEOUS
  Administered 2020-02-03: 5 [IU] via SUBCUTANEOUS
  Administered 2020-02-03: 3 [IU] via SUBCUTANEOUS
  Administered 2020-02-03: 15 [IU] via SUBCUTANEOUS
  Administered 2020-02-04: 11 [IU] via SUBCUTANEOUS
  Administered 2020-02-04: 5 [IU] via SUBCUTANEOUS
  Administered 2020-02-04: 15 [IU] via SUBCUTANEOUS
  Administered 2020-02-05 (×2): 3 [IU] via SUBCUTANEOUS
  Administered 2020-02-05: 5 [IU] via SUBCUTANEOUS
  Administered 2020-02-06: 8 [IU] via SUBCUTANEOUS
  Administered 2020-02-06: 3 [IU] via SUBCUTANEOUS
  Administered 2020-02-06: 11 [IU] via SUBCUTANEOUS
  Administered 2020-02-07: 2 [IU] via SUBCUTANEOUS
  Administered 2020-02-07: 5 [IU] via SUBCUTANEOUS
  Administered 2020-02-07 – 2020-02-08 (×2): 3 [IU] via SUBCUTANEOUS
  Administered 2020-02-08: 2 [IU] via SUBCUTANEOUS
  Administered 2020-02-08: 5 [IU] via SUBCUTANEOUS
  Administered 2020-02-09: 2 [IU] via SUBCUTANEOUS
  Administered 2020-02-09: 3 [IU] via SUBCUTANEOUS
  Administered 2020-02-09: 5 [IU] via SUBCUTANEOUS
  Administered 2020-02-10: 3 [IU] via SUBCUTANEOUS

## 2020-02-01 NOTE — Progress Notes (Signed)
NAME:  Sydney Phillips, MRN:  [id number], DOB:  Mar 08, 1936, LOS: 2 ADMISSION DATE:  01/30/2020, CONSULTATION DATE:  8/16 REFERRING MD:  EDP, CHIEF COMPLAINT:  dyspnea   Brief History   84 y/o female with a complex medical history including COVID pneumonia diagnosed on 8/5 and treated through 8/9 as an inpatient in [facility] returned to [facility] on 8/16 with confusion, dyspnea and weakness.  She was found to have profound acute hypoxemic respiratory failure presumably due to worsening COVID 19 pneumonia.  Past Medical History  CAD COPD on 2 L [place] GERD CAD Ischemic cardiomyopathy, ICD/CRT treatment Pulmonary hypertension Microcytic anemia CKD IIIa Tobacco abuse Diabetic foot ulcers Hyperlipidemia Hypertension VTach fracture of left wrist  Significant Hospital Events   7/29 through 8/1 > admitted for CHF exac/frequent ICD shocks 8/4 through 8/9 > admitted for COVID PNA. 8/16 > readmitted for AoC hypoxic respiratory failure.  Consults:  Cardiology  Procedures:    Significant Diagnostic Tests:  8/17 TTE> LVEF 25-30%, RVSP 28.6 mmHg   Micro Data:  Blood 8/16 >  Sputum 8/16 >   Antimicrobials:  Vanc 8/16 >  Cefepime 8/16 >  Interim history/subjective:   Feels better this morning Still very hypoxemic Wants to eat Taking off her NRB mask a lot  Objective   Blood pressure (!) 143/60, pulse 60, temperature (!) 97.3 F (36.3 C), temperature source Oral, resp. rate (!) 26, weight 81.2 kg, SpO2 (!) 88 %.    FiO2 (%):  [100 %] 100 %   Intake/Output Summary (Last 24 hours) at 02/01/2020 0947 Last data filed at 02/01/2020 0600 Gross per 24 hour  Intake 780 ml  Output 1800 ml  Net -1020 ml   Filed Weights   02/01/20 0500  Weight: 81.2 kg    Examination:  General:  Resting comfortably in bed HENT: NCAT OP clear PULM: Crackles bases B, normal effort CV: RRR, no mgr GI: BS+, soft, nontender MSK: normal bulk and tone Neuro: awake, alert, no  distress, MAEW   8/17 CXR > emphysema, cardiomegally, ICD in place, compared to 8/17, there is progression of bilateral diffuse interstitial infiltrates  Resolved Hospital Problem list     Assessment & Plan:  Acute on chronic respiratory failure with hypoxemia: the most likely etiology is progressive fibrotic change related to COVID 19 (organizing pneumonia) however ddx includes bacterial superinfection with a nosocomial organism or acute pulmonary edema. Overall prognosis is poor given the patient's advanced age and multiple comorbid illnesses > continue heated high flow oxygen > continue solumedrol > continue antibiotics as ordered, likely narrow on 1/85  Chronic systolic heart failure CAD VT, ICD in place Pulmonary hypertension Tele Amiodarone Continue home b-blocker Continue home spironolactone and torsemide  CKD stage IIIa > stable Monitor BMET and UOP Replace electrolytes as needed  COPD on home O2 Continue combivent and solumedrol  Wrist fracture PT consult when appropriate  Gout Allopurinol  Dementia Frequent orientation   Best practice:  Diet: regular Pain/Anxiety/Delirium protocol (if indicated): n/a VAP protocol (if indicated): n/a DVT prophylaxis: lovenox GI prophylaxis: n/a Glucose control: monitor Mobility: out of bed as tolerated Code Status: DNR Family Communication: I called her son [name] today for an update and let him know that she seems to be a little better today.  We will continue current management.  Disposition: remain in ICU  Labs   CBC: Recent Labs  Lab 01/30/20 1723 01/30/20 1726 01/31/20 0404 01/31/20 1309 02/01/20 0447  WBC 23.8*  --  12.9* 14.7* 21.5*  NEUTROABS 21.0*  --   --   --   --   HGB 16.3* 17.7* 14.5 15.0 14.6  HCT 50.6* 52.0* 45.5 46.9* 44.9  MCV 97.5  --  98.1 97.1 96.8  PLT 323  --  204 214 675    Basic Metabolic Panel: Recent Labs  Lab 01/30/20 1723 01/30/20 1726 01/31/20 0404 01/31/20 1309  02/01/20 0447  NA 134* 132* 134*  --  134*  K 4.5 5.1 4.4  --  4.2  CL 90*  --  95*  --  96*  CO2 28  --  25  --  25  GLUCOSE 147*  --  253*  --  205*  BUN 49*  --  50*  --  58*  CREATININE 2.05*  --  1.96* 1.98* 1.86*  CALCIUM 9.4  --  9.0  --  9.0  MG  --   --  1.9  --  1.9  PHOS  --   --  4.4  --  3.5   GFR: Estimated Creatinine Clearance: 24.6 mL/min (A) (by C-G formula based on SCr of 1.86 mg/dL (H)). Recent Labs  Lab 01/30/20 1723 01/30/20 1945 01/31/20 0234 01/31/20 0352 01/31/20 0404 01/31/20 1309 02/01/20 0447  WBC 23.8*  --   --   --  12.9* 14.7* 21.5*  LATICACIDVEN 3.0* 2.0* 1.7 1.6  --   --   --     Liver Function Tests: Recent Labs  Lab 01/30/20 1723  AST 41  ALT 38  ALKPHOS 85  BILITOT 1.1  PROT 7.4  ALBUMIN 3.4*   No results for input(s): LIPASE, AMYLASE in the last 168 hours. No results for input(s): AMMONIA in the last 168 hours.  ABG    Component Value Date/Time   PHART 7.432 12/05/2019 1343   PCO2ART 53.9 (H) 12/05/2019 1343   PO2ART 94 12/05/2019 1343   HCO3 33.0 (H) 01/30/2020 1726   TCO2 34 (H) 01/30/2020 1726   ACIDBASEDEF 6.0 (H) 04/06/2016 1242   O2SAT 51.0 01/30/2020 1726     Coagulation Profile: No results for input(s): INR, PROTIME in the last 168 hours.  Cardiac Enzymes: No results for input(s): CKTOTAL, CKMB, CKMBINDEX, TROPONINI in the last 168 hours.  HbA1C: Hgb A1c MFr Bld  Date/Time Value Ref Range Status  01/14/2020 02:31 AM 6.7 (H) 4.8 - 5.6 % Final    Comment:    (NOTE) Pre diabetes:          5.7%-6.4%  Diabetes:              >6.4%  Glycemic control for   <7.0% adults with diabetes   03/09/2019 04:12 PM 7.2 (H) 4.6 - 6.5 % Final    Comment:    Glycemic Control Guidelines for People with Diabetes:Non Diabetic:  <6%Goal of Therapy: <7%Additional Action Suggested:  >8%     CBG: Recent Labs  Lab 01/31/20 0146  GLUCAP 267*     Critical care time: 35 minutes    Roselie Awkward, MD [place]  PCCM Pager: [phone] Cell: [phone] If no response, call [phone]

## 2020-02-02 LAB — GLUCOSE, CAPILLARY
Glucose-Capillary: 211 mg/dL — ABNORMAL HIGH (ref 70–99)
Glucose-Capillary: 187 mg/dL — ABNORMAL HIGH (ref 70–99)
Glucose-Capillary: 272 mg/dL — ABNORMAL HIGH (ref 70–99)
Glucose-Capillary: 267 mg/dL — ABNORMAL HIGH (ref 70–99)
Glucose-Capillary: 264 mg/dL — ABNORMAL HIGH (ref 70–99)

## 2020-02-02 LAB — COMPREHENSIVE METABOLIC PANEL
ALT: 33 U/L (ref 0–44)
AST: 37 U/L (ref 15–41)
Albumin: 2.4 g/dL — ABNORMAL LOW (ref 3.5–5.0)
Alkaline Phosphatase: 63 U/L (ref 38–126)
Anion gap: 11 (ref 5–15)
BUN: 65 mg/dL — ABNORMAL HIGH (ref 8–23)
CO2: 25 mmol/L (ref 22–32)
Calcium: 9.1 mg/dL (ref 8.9–10.3)
Chloride: 97 mmol/L — ABNORMAL LOW (ref 98–111)
Creatinine, Ser: 1.94 mg/dL — ABNORMAL HIGH (ref 0.44–1.00)
GFR calc Af Amer: 27 mL/min — ABNORMAL LOW (ref >60)
GFR calc non Af Amer: 24 mL/min — ABNORMAL LOW (ref >60)
Glucose, Bld: 325 mg/dL — ABNORMAL HIGH (ref 70–99)
Potassium: 4.4 mmol/L (ref 3.5–5.1)
Sodium: 133 mmol/L — ABNORMAL LOW (ref 135–145)
Total Bilirubin: 0.5 mg/dL (ref 0.3–1.2)
Total Protein: 5.8 g/dL — ABNORMAL LOW (ref 6.5–8.1)

## 2020-02-02 NOTE — Evaluation (Signed)
Physical Therapy Evaluation Patient Details Name: Sydney Phillips MRN: [id number] DOB: 05-25-1936 Today's Date: 02/02/2020   History of Present Illness  Pt readmitted 8/16 with confusion, dyspnea and weakness and found to have profound acute hypoxemic respiratory failure due to worsening Covid PNA. Pt diagnosed with Covid 8/5 with hospitalization 8/5-8/9. PMH - copd, cad, ckd, DM, HTN, recent lt wrist fx, ICD  Clinical Impression  Pt admitted with above diagnosis and presents to PT with functional limitations due to deficits listed below (See PT problem list). Pt needs skilled PT to maximize independence and safety to allow discharge to SNF. Pt with tenuous medical status. Expect slow progress if medical status improves.      Follow Up Recommendations SNF    Equipment Recommendations  Other (comment) (TBD at next venue)    Recommendations for Other Services       Precautions / Restrictions Precautions Precautions: Fall;Other (comment) Precaution Comments: NWB L UE, WB through elbow Required Braces or Orthoses: Splint/Cast Splint/Cast: Lt wrist wrap Restrictions Weight Bearing Restrictions: Yes LUE Weight Bearing: Non weight bearing Other Position/Activity Restrictions: can weight bear through elbow per prior chart      Mobility  Bed Mobility Overal bed mobility: Needs Assistance Bed Mobility: Supine to Sit;Sit to Supine     Supine to sit: Mod assist;HOB elevated Sit to supine: Mod assist;HOB elevated   General bed mobility comments: Assist to elevate trunk into sitting and bring hips to EOB. Assist to bring legs back up into bed returning to supine  Transfers                 General transfer comment: Did not attempt  Ambulation/Gait             General Gait Details: unable  Stairs            Wheelchair Mobility    Modified Rankin (Stroke Patients Only)       Balance Overall balance assessment: Needs assistance Sitting-balance support:  Single extremity supported;Feet supported Sitting balance-Leahy Scale: Poor Sitting balance - Comments: requires holding bed rail                                      Pertinent Vitals/Pain Pain Assessment: No/denies pain    Home Living Family/patient expects to be discharged to:: Private residence Living Arrangements: Children Available Help at Discharge: Family;Available 24 hours/day Type of Home: House Home Access: Stairs to enter Entrance Stairs-Rails: Left Entrance Stairs-Number of Steps: 2 Home Layout: One level Home Equipment: Walker - 2 wheels;Cane - single point;Shower seat;Walker - 4 wheels;Other (comment) (home O2, lt platform on walker)      Prior Function Level of Independence: Needs assistance   Gait / Transfers Assistance Needed: Assist for ambulation at time of DC 8/9           Hand Dominance   Dominant Hand: Right    Extremity/Trunk Assessment   Upper Extremity Assessment Upper Extremity Assessment: Defer to OT evaluation    Lower Extremity Assessment Lower Extremity Assessment: Generalized weakness    Cervical / Trunk Assessment Cervical / Trunk Assessment: Kyphotic  Communication   Communication: HOH  Cognition Arousal/Alertness: Awake/alert Behavior During Therapy: WFL for tasks assessed/performed Overall Cognitive Status: No family/caregiver present to determine baseline cognitive functioning Area of Impairment: Orientation;Safety/judgement;Problem solving;Following commands;Memory                 Orientation Level:  Disoriented to;Time Current Attention Level: Sustained Memory: Decreased recall of precautions;Decreased short-term memory Following Commands: Follows one step commands with increased time Safety/Judgement: Decreased awareness of safety;Decreased awareness of deficits   Problem Solving: Slow processing;Requires verbal cues;Requires tactile cues;Difficulty sequencing General Comments: Pt with history of  dementia      General Comments General comments (skin integrity, edema, etc.): Pt on 30L [facility]NC and non rebreather with SpO2 86-90%    Exercises     Assessment/Plan    PT Assessment Patient needs continued PT services  PT Problem List Decreased strength;Decreased activity tolerance;Decreased balance;Decreased mobility;Decreased knowledge of use of DME;Decreased cognition;Decreased safety awareness;Cardiopulmonary status limiting activity       PT Treatment Interventions DME instruction;Gait training;Functional mobility training;Therapeutic activities;Therapeutic exercise;Balance training;Patient/family education    PT Goals (Current goals can be found in the Care Plan section)  Acute Rehab PT Goals Patient Stated Goal: go home PT Goal Formulation: Patient unable to participate in goal setting Time For Goal Achievement: 02/16/20 Potential to Achieve Goals: Fair    Frequency Min 2X/week   Barriers to discharge Decreased caregiver support;Inaccessible home environment;Other (comment) son unable to care for. Stairs to enter    Co-evaluation               AM-PAC PT "6 Clicks" Mobility  Outcome Measure Help needed turning from your back to your side while in a flat bed without using bedrails?: A Lot Help needed moving from lying on your back to sitting on the side of a flat bed without using bedrails?: A Lot Help needed moving to and from a bed to a chair (including a wheelchair)?: Total Help needed standing up from a chair using your arms (e.g., wheelchair or bedside chair)?: Total Help needed to walk in hospital room?: Total Help needed climbing 3-5 steps with a railing? : Total 6 Click Score: 8    End of Session Equipment Utilized During Treatment: Oxygen Activity Tolerance: Patient limited by fatigue;Treatment limited secondary to medical complications (Comment) (poor respiratory status) Patient left: in bed;with call bell/phone within reach;with bed alarm set Nurse  Communication: Mobility status (nurse tech present) PT Visit Diagnosis: Other abnormalities of gait and mobility (R26.89);Muscle weakness (generalized) (M62.81);History of falling (Z91.81)    Time: 1696-7893 PT Time Calculation (min) (ACUTE ONLY): 24 min   Charges:   PT Evaluation $PT Eval Moderate Complexity: 1 Mod PT Treatments $Therapeutic Activity: 8-22 mins        [place] Pager [phone] Office [place] 02/02/2020, 5:43 PM

## 2020-02-02 NOTE — Consult Note (Signed)
   [facility] Inpatient Consult   02/02/2020  [name] 07/06/1935 [id number]   [facility] [ACO] Patient:  Medicare Next Gen   Patient was referred to [facility] for chronic disease management services from prior admission.  Patient is less than 7 days readmission hospitalization with COVID-19.  [place] had attempted a post hospital follow up call but was unable to reach.  Medical record reviewed for readmission progress notes and inpatient Transition of Care team notes.    Plan: Will continue to follow reviews of Inpatient Transition Of Care [TOC] team member and will  make aware that [facility] following.  Patient is eligible for benefits and services.  Of note, [facility] Care Management services does not replace or interfere with any services that are needed or arranged by inpatient [facility] care management team.  For additional questions or referrals please contact:  Natividad Brood, RN BSN [facility] Liaison  [phone] business mobile phone Toll free office [phone]  Fax number: [phone] [email or web address]

## 2020-02-02 NOTE — Progress Notes (Signed)
NAME:  Sydney Phillips, MRN:  [id number], DOB:  Jun 08, 1936, LOS: 3 ADMISSION DATE:  01/30/2020, CONSULTATION DATE:  8/16 REFERRING MD:  EDP, CHIEF COMPLAINT:  dyspnea   Brief History   84 y/o female with a complex medical history including COVID pneumonia diagnosed on 8/5 and treated through 8/9 as an inpatient in [facility] returned to [facility] on 8/16 with confusion, dyspnea and weakness.  She was found to have profound acute hypoxemic respiratory failure presumably due to worsening COVID 19 pneumonia.  Past Medical History  CAD COPD on 2 L [place] GERD CAD Ischemic cardiomyopathy, ICD/CRT treatment Pulmonary hypertension Microcytic anemia CKD IIIa Tobacco abuse Diabetic foot ulcers Hyperlipidemia Hypertension VTach fracture of left wrist  Significant Hospital Events   7/29 through 8/1 > admitted for CHF exac/frequent ICD shocks 8/4 through 8/9 > admitted for COVID PNA 8/16 > readmitted for AoC hypoxic respiratory failure  Consults:    Procedures:    Significant Diagnostic Tests:  8/17 TTE> LVEF 25-30%, RVSP 28.6 mmHg   Micro Data:  Blood 8/16 >  Sputum 8/16 >   Antimicrobials:  Vanc 8/16 >  Cefepime 8/16 >  Interim history/subjective:   Says she feels better Oxygen needs unchanged Ate breakfast Diuresed Labs pending  Objective   Blood pressure (!) 141/65, pulse 60, temperature 97.8 F (36.6 C), temperature source Oral, resp. rate (!) 21, weight 81.3 kg, SpO2 (!) 87 %.    FiO2 (%):  [100 %] 100 %   Intake/Output Summary (Last 24 hours) at 02/02/2020 0727 Last data filed at 02/02/2020 0000 Gross per 24 hour  Intake --  Output 1950 ml  Net -1950 ml   Filed Weights   02/01/20 0500 02/02/20 0421  Weight: 81.2 kg 81.3 kg    Examination:  General:  Resting comfortably in bed HENT: NCAT OP clear PULM: Crackles B, no wheezing, normal effort CV: RRR, no mgr GI: BS+, soft, nontender MSK: normal bulk and tone Neuro: awake, alert, no  distress, MAEW  8/17 CXR > emphysema, cardiomegally, ICD in place, compared to 8/17, there is progression of bilateral diffuse interstitial infiltrates  Resolved Hospital Problem list     Assessment & Plan:  Acute on chronic respiratory failure with hypoxemia: the most likely etiology is progressive fibrotic change related to COVID 19 (organizing pneumonia), possibly superimposed HCAP Initially tested COVID positive on 8/5 Overall prognosis is poor given the patient's advanced age and multiple comorbid illnesses 8/19 remains very ill but oxygenation stable > continue heated high flow oxygen > continue solumedrol 80mg  IV q12 through 8/20, then would treat with prednisone 50mg  daily x 1 week, then 40mg  daily x 1 week, then 30mg  daily x 1 week, then 20mg  daily x 1 week, then 10mg  daily 1 week > will need outpatient pulmonary follow up (already established with our team, please call at time of discharge) > continue antibiotics  Chronic systolic heart failure CAD VT, ICD in place Pulmonary hypertension Tele Amiodarone Continue home bisoprolol, spironolactone and torsemide  CKD stage IIIa > stable Monitor BMET and UOP > send repeat lab work this morning Replace electrolytes as needed  COPD on home O2 Continue combivent and solumedrol  Wrist fracture > seems to be stable; SNF recommended previous admission by PT/OT PT/OT consult  Gout Allopurinol  Dementia Frequent orientation   Best practice:  Diet: regular Pain/Anxiety/Delirium protocol (if indicated): n/a VAP protocol (if indicated): n/a DVT prophylaxis: lovenox GI prophylaxis: n/a Glucose control: monitor Mobility: out of bed as  tolerated Code Status: DNR Family Communication: I called her son [name] today for an update and let him know that she is stable today. Disposition: remain in ICU  Labs   CBC: Recent Labs  Lab 01/30/20 1723 01/30/20 1726 01/31/20 0404 01/31/20 1309 02/01/20 0447  WBC 23.8*  --  12.9*  14.7* 21.5*  NEUTROABS 21.0*  --   --   --   --   HGB 16.3* 17.7* 14.5 15.0 14.6  HCT 50.6* 52.0* 45.5 46.9* 44.9  MCV 97.5  --  98.1 97.1 96.8  PLT 323  --  204 214 970    Basic Metabolic Panel: Recent Labs  Lab 01/30/20 1723 01/30/20 1726 01/31/20 0404 01/31/20 1309 02/01/20 0447  NA 134* 132* 134*  --  134*  K 4.5 5.1 4.4  --  4.2  CL 90*  --  95*  --  96*  CO2 28  --  25  --  25  GLUCOSE 147*  --  253*  --  205*  BUN 49*  --  50*  --  58*  CREATININE 2.05*  --  1.96* 1.98* 1.86*  CALCIUM 9.4  --  9.0  --  9.0  MG  --   --  1.9  --  1.9  PHOS  --   --  4.4  --  3.5   GFR: Estimated Creatinine Clearance: 24.6 mL/min (A) (by C-G formula based on SCr of 1.86 mg/dL (H)). Recent Labs  Lab 01/30/20 1723 01/30/20 1945 01/31/20 0234 01/31/20 0352 01/31/20 0404 01/31/20 1309 02/01/20 0447  WBC 23.8*  --   --   --  12.9* 14.7* 21.5*  LATICACIDVEN 3.0* 2.0* 1.7 1.6  --   --   --     Liver Function Tests: Recent Labs  Lab 01/30/20 1723  AST 41  ALT 38  ALKPHOS 85  BILITOT 1.1  PROT 7.4  ALBUMIN 3.4*   No results for input(s): LIPASE, AMYLASE in the last 168 hours. No results for input(s): AMMONIA in the last 168 hours.  ABG    Component Value Date/Time   PHART 7.432 12/05/2019 1343   PCO2ART 53.9 (H) 12/05/2019 1343   PO2ART 94 12/05/2019 1343   HCO3 33.0 (H) 01/30/2020 1726   TCO2 34 (H) 01/30/2020 1726   ACIDBASEDEF 6.0 (H) 04/06/2016 1242   O2SAT 51.0 01/30/2020 1726     Coagulation Profile: No results for input(s): INR, PROTIME in the last 168 hours.  Cardiac Enzymes: No results for input(s): CKTOTAL, CKMB, CKMBINDEX, TROPONINI in the last 168 hours.  HbA1C: Hgb A1c MFr Bld  Date/Time Value Ref Range Status  01/14/2020 02:31 AM 6.7 (H) 4.8 - 5.6 % Final    Comment:    (NOTE) Pre diabetes:          5.7%-6.4%  Diabetes:              >6.4%  Glycemic control for   <7.0% adults with diabetes   03/09/2019 04:12 PM 7.2 (H) 4.6 - 6.5 % Final      Comment:    Glycemic Control Guidelines for People with Diabetes:Non Diabetic:  <6%Goal of Therapy: <7%Additional Action Suggested:  >8%     CBG: Recent Labs  Lab 01/31/20 0146 02/01/20 1659 02/02/20 0358  GLUCAP 267* 269* 211*     Critical care time: 35 minutes    Roselie Awkward, MD [facility] Pager: [phone] Cell: [phone] If no response, call [phone]

## 2020-02-02 NOTE — TOC Initial Note (Signed)
Transition of Care [facility]) - Initial/Assessment Note    Patient Details  Name: Sydney Phillips MRN: [id number] Date of Birth: March 10, 1936  [facility] CM/SW Contact:    Verdell Carmine, RN Phone Number: 02/02/2020, 1:34 PM  Clinical Narrative:                 Transferred from ICU was intubated briefly and extubated. Still on high flow oxygen. Is active with [place] will continue to follow for discharge needs  Expected Discharge Plan: [place] Barriers to Discharge: Continued Medical Work up   Patient Goals and CMS Choice        Expected Discharge Plan and Services Expected Discharge Plan: [place]   Discharge Planning Services: CM Consult   Living arrangements for the past 2 months: Single Family Home                                      Prior Living Arrangements/Services Living arrangements for the past 2 months: Single Family Home Lives with:: Adult Children Patient language and need for interpreter reviewed:: Yes Do you feel safe going back to the place where you live?: Yes      Need for Family Participation in Patient Care: Yes (Comment) Care giver support system in place?: Yes (comment)   Criminal Activity/Legal Involvement Pertinent to Current Situation/Hospitalization: No - Comment as needed  Activities of Daily Living Home Assistive Devices/Equipment: Walker (specify type) ADL Screening (condition at time of admission) Patient's cognitive ability adequate to safely complete daily activities?: Yes Is the patient deaf or have difficulty hearing?: Yes Does the patient have difficulty seeing, even when wearing glasses/contacts?: No Does the patient have difficulty concentrating, remembering, or making decisions?: No Patient able to express need for assistance with ADLs?: Yes Does the patient have difficulty dressing or bathing?: No Independently performs ADLs?: Yes (appropriate for  developmental age) Does the patient have difficulty walking or climbing stairs?: Yes Weakness of Legs: Both Weakness of Arms/Hands: None  Permission Sought/Granted                  Emotional Assessment Appearance:: Appears stated age       Alcohol / Substance Use: Not Applicable Psych Involvement: No (comment)  Admission diagnosis:  Acute respiratory failure with hypoxia ([place]) [J96.01] Acute hypoxemic respiratory failure ([place]) [J96.01] Sepsis, due to unspecified organism, unspecified whether acute organ dysfunction present ([place]) [A41.9] Acute respiratory distress syndrome (ARDS) due to COVID-19 virus ([place]) [U07.1, J80] COVID-19 [U07.1] Patient Active Problem List   Diagnosis Date Noted  . Pressure injury of skin 02/01/2020  . Acute respiratory distress syndrome (ARDS) due to COVID-19 virus ([place]) 01/31/2020  . Acute hypoxemic respiratory failure ([place]) 01/30/2020  . Pneumonia due to COVID-19 virus 01/20/2020  . COVID-19 01/19/2020  . Abnormal thyroid function test 01/15/2020  . Fracture of left wrist 01/15/2020  . ICD (implantable cardioverter-defibrillator) discharge 01/12/2020  . Hyperlipemia   . Fibromyalgia   . Anxiety   . Acute on chronic congestive heart failure ([place]) 12/05/2019  . V-tach ([place]) 10/22/2019  . Type 2 diabetes mellitus with diabetic chronic kidney disease ([place]) 01/05/2019  . Diabetic foot ulcers ([place]) 06/18/2018  . Rhinitis, chronic 09/29/2017  . Hearing loss 09/04/2017  . Perforation of left tympanic membrane 09/04/2017  . Acute exacerbation of CHF (congestive heart failure) ([place]) 07/19/2017  .  Acute on chronic combined systolic and diastolic CHF (congestive heart failure) ([place]) 07/19/2017  . Acute on chronic respiratory failure with hypoxia and hypercapnia ([place]) 07/19/2017  . Poor dentition 04/01/2017  . Tobacco abuse 12/17/2016  . Steroid-induced psychosis, with hallucinations ([place])   . Chronic respiratory failure with hypoxia ([place])   . Acute  encephalopathy 05/30/2016  . CKD (chronic kidney disease), stage III 05/30/2016  . Leukocytosis   . Stenosis of left subclavian artery ([place]) 05/19/2016  . Somnolence   . OSA and COPD overlap syndrome ([place])   . Cardiomyopathy, ischemic   . Ischemic mitral valve regurgitation   . Pulmonary hypertension ([place])   . CAD S/P percutaneous coronary angioplasty   . Altered mental state 02/26/2016  . Arthralgia 10/12/2015  . Left carotid stenosis 11/08/2014  . Iron deficiency anemia 04/17/2014  . Carotid artery disease ([place]) 04/17/2014  . LBBB (left bundle branch block) 04/16/2014  . Diastolic dysfunction 46/96/2952  . NSTEMI (non-ST elevated myocardial infarction) ([place]) 01/20/2014  . Melena 01/20/2014  . Microcytic anemia 01/19/2014  . Colon polyp 03/24/2013  . S/P angioplasty with stent, lt. subclavian 07/31/11 08/01/2011  . Implantable cardioverter-defibrillator (ICD) in situ 07/28/2011  . PVD, 07/28/2011  . Vertigo 07/27/2011  . Hypokalemia 07/27/2011  . SPINAL STENOSIS 01/28/2010  . Chronic constipation 01/14/2010  . Insomnia 01/23/2009  . HIP PAIN, BILATERAL 06/21/2008  . Coronary atherosclerosis 03/30/2008  . Other primary cardiomyopathies 03/30/2008  . Myalgia and myositis 11/26/2007  . Hyperlipidemia LDL goal <70 07/28/2007  . WEIGHT GAIN 06/04/2007  . COPD (chronic obstructive pulmonary disease) ([place]) 03/02/2007  . Depression with anxiety 02/25/2007  . Hypertension 02/25/2007  . GERD 02/25/2007  . COLONIC POLYPS, HX OF 02/25/2007   PCP:  Laurey Morale, MD Pharmacy:   [place], [place] - [address] [place] [address] Phone: [phone] Fax: [phone]     Social Determinants of Health (SDOH) Interventions    Readmission Risk Interventions Readmission Risk Prevention Plan 12/08/2019 12/08/2019  Transportation Screening Complete Complete  PCP or Specialist Appt within 3-5 Days Complete -  HRI or Home Care  Consult Complete -  Social Work Consult for [place] Planning/Counseling Complete Complete  Palliative Care Screening Not Applicable Not Applicable  Medication Review Press photographer) Complete Complete  Some recent data might be hidden

## 2020-02-03 ENCOUNTER — Ambulatory Visit: Payer: Self-pay

## 2020-02-03 LAB — COMPREHENSIVE METABOLIC PANEL
ALT: 33 U/L (ref 0–44)
AST: 27 U/L (ref 15–41)
Albumin: 2.6 g/dL — ABNORMAL LOW (ref 3.5–5.0)
Alkaline Phosphatase: 72 U/L (ref 38–126)
Anion gap: 17 — ABNORMAL HIGH (ref 5–15)
BUN: 70 mg/dL — ABNORMAL HIGH (ref 8–23)
CO2: 24 mmol/L (ref 22–32)
Calcium: 9.6 mg/dL (ref 8.9–10.3)
Chloride: 90 mmol/L — ABNORMAL LOW (ref 98–111)
Creatinine, Ser: 1.89 mg/dL — ABNORMAL HIGH (ref 0.44–1.00)
GFR calc Af Amer: 28 mL/min — ABNORMAL LOW (ref >60)
GFR calc non Af Amer: 24 mL/min — ABNORMAL LOW (ref >60)
Glucose, Bld: 323 mg/dL — ABNORMAL HIGH (ref 70–99)
Potassium: 4.8 mmol/L (ref 3.5–5.1)
Sodium: 131 mmol/L — ABNORMAL LOW (ref 135–145)
Total Bilirubin: 0.7 mg/dL (ref 0.3–1.2)
Total Protein: 6.6 g/dL (ref 6.5–8.1)

## 2020-02-03 LAB — GLUCOSE, CAPILLARY
Glucose-Capillary: 236 mg/dL — ABNORMAL HIGH (ref 70–99)
Glucose-Capillary: 386 mg/dL — ABNORMAL HIGH (ref 70–99)
Glucose-Capillary: 197 mg/dL — ABNORMAL HIGH (ref 70–99)
Glucose-Capillary: 280 mg/dL — ABNORMAL HIGH (ref 70–99)

## 2020-02-03 MED ORDER — INSULIN GLARGINE 100 UNIT/ML ~~LOC~~ SOLN
10.0000 [IU] | Freq: Every day | SUBCUTANEOUS | Status: DC
  Administered 2020-02-03 – 2020-02-04 (×2): 10 [IU] via SUBCUTANEOUS
  Filled 2020-02-03 (×3): qty 0.1

## 2020-02-03 MED ORDER — ONDANSETRON HCL 4 MG/2ML IJ SOLN
4.0000 mg | Freq: Four times a day (QID) | INTRAMUSCULAR | Status: DC | PRN
  Administered 2020-02-03: 4 mg via INTRAVENOUS
  Filled 2020-02-03: qty 2

## 2020-02-03 NOTE — Progress Notes (Signed)
Inpatient Diabetes Program Recommendations  AACE/ADA: New Consensus Statement on Inpatient Glycemic Control (2015)  Target Ranges:  Prepandial:   less than 140 mg/dL      Peak postprandial:   less than 180 mg/dL (1-2 hours)      Critically ill patients:  140 - 180 mg/dL   Lab Results  Component Value Date   GLUCAP 386 (H) 02/03/2020   HGBA1C 6.7 (H) 01/14/2020    Review of Glycemic Control Results for [place], SCHUCHART (MRN [id number]) as of 02/03/2020 12:28  Ref. Range 02/02/2020 11:24 02/02/2020 16:46 02/02/2020 21:16 02/03/2020 07:54 02/03/2020 12:01  Glucose-Capillary Latest Ref Range: 70 - 99 mg/dL 272 (H) 267 (H) 264 (H) 236 (H) 386 (H)   Diabetes history: DM 2 Outpatient Diabetes medications: diet controlled, A1c 6.7% on 7/31 Current orders for Inpatient glycemic control:  Novolog 0-15 units tid  Inpatient Diabetes Program Recommendations:    Noted Lantus 10 units added  - Add Tradjenta 5 mg Daily - Add Novolog hs scale  Thanks,  Tama Headings RN, MSN, BC-ADM Inpatient Diabetes Coordinator Team Pager [phone] (8a-5p)

## 2020-02-03 NOTE — Progress Notes (Signed)
Pharmacy Antibiotic Note  Sydney Phillips is a 84 y.o. female admitted on 01/30/2020 with sepsis.    She has been on D4 of her empiric cefepime for possible sepsis. Her blood cultures remained neg. WBC is elevated likely due to steroids. Renal function remains about the same with scr~1.9.  Plan: Cefepime 2gm IV Q24H F/u renal fxn  Weight: 81.3 kg (179 lb 3.7 oz)  Temp (24hrs), Avg:97.9 F (36.6 C), Min:97.4 F (36.3 C), Max:98.5 F (36.9 C)  Recent Labs  Lab 01/30/20 1723 01/30/20 1945 01/31/20 0234 01/31/20 0352 01/31/20 0404 01/31/20 1309 02/01/20 0447 02/02/20 1025  WBC 23.8*  --   --   --  12.9* 14.7* 21.5*  --   CREATININE 2.05*  --   --   --  1.96* 1.98* 1.86* 1.94*  LATICACIDVEN 3.0* 2.0* 1.7 1.6  --   --   --   --     Estimated Creatinine Clearance: 23.5 mL/min (A) (by C-G formula based on SCr of 1.94 mg/dL (H)).    Allergies  Allergen Reactions  . Potassium-Containing Compounds Other (See Comments)    Causes severe constipation  . Neomycin-Polymyxin B Gu     Swollen throat   . Other Other (See Comments)    "mycin" antibiotics cause sleeplessness and itching  . Azithromycin Rash  . Codeine Itching  . Darvon Itching  . Erythromycin Rash  . Meloxicam Other (See Comments)    Unknown    . Norco [Hydrocodone-Acetaminophen] Itching  . Penicillins Rash and Other (See Comments)    Has patient had a PCN reaction causing immediate rash, facial/tongue/throat swelling, SOB or lightheadedness with hypotension: unknown Has patient had a PCN reaction causing severe rash involving mucus membranes or skin necrosis: no Has patient had a PCN reaction that required hospitalization: unknown Has patient had a PCN reaction occurring within the last 10 years: no If all of the above answers are "NO", then may proceed with Cephalosporin use.   Marland Kitchen Propoxyphene N-Acetaminophen Itching  . Rofecoxib Other (See Comments)    Unknown    . Rosuvastatin Other (See Comments)     cramps  . Statins Itching and Other (See Comments)    Sleeplessness Sleeplessness  . Sulfa Antibiotics Rash    Antimicrobials this admission: Vanc 8/16>>8/18 Cefepime 8/16>>  Dose adjustments this admission: N/A  Microbiology results: 8/16 blood>>ngtd  Onnie Boer, PharmD, BCIDP, AAHIVP, CPP Infectious Disease Pharmacist 02/03/2020 10:25 AM

## 2020-02-03 NOTE — Evaluation (Signed)
Occupational Therapy Evaluation Patient Details Name: Sydney Phillips MRN: [id number] DOB: Jun 01, 1936 Today's Date: 02/03/2020    History of Present Illness Pt readmitted 8/16 with confusion, dyspnea and weakness and found to have profound acute hypoxemic respiratory failure due to worsening Covid PNA. Pt diagnosed with Covid 8/5 with hospitalization 8/5-8/9. PMH - copd, cad, ckd, DM, HTN, recent lt wrist fx, ICD   Clinical Impression   PTA, pt was living with her daughter and was performing BADLs; however, having several falls resulting in left arm fx. Pt currently requiring Min A for UB ADLs, Max A for LB ADLs, and Max A for functional transfer. Pt presenting with decreased balance, strength, and activity tolerance. SpO2 >83% on 25L via heated HFNC and 15L via NRB. Pt dropping to 83-79 without NRB mask. RR elevating to 30s with activity. Pt would benefit from further acute OT to facilitate safe dc. Recommend dc to SNF for further OT to optimize safety, independence with ADLs, and return to PLOF.     Follow Up Recommendations  SNF;Supervision/Assistance - 24 hour    Equipment Recommendations  Other (comment) (Defer to next venue)    Recommendations for Other Services PT consult     Precautions / Restrictions Precautions Precautions: Fall;Other (comment) Precaution Comments: NWB L UE, WB through elbow Required Braces or Orthoses: Splint/Cast Splint/Cast: Lt wrist wrap Restrictions Weight Bearing Restrictions: Yes LUE Weight Bearing: Weight bear through elbow only Other Position/Activity Restrictions: can weight bear through elbow per prior chart      Mobility Bed Mobility Overal bed mobility: Needs Assistance Bed Mobility: Supine to Sit     Supine to sit: Mod assist;HOB elevated     General bed mobility comments: Assist to elevate trunk into sitting and bring hips to EOB. Assist to bring legs back up into bed returning to supine  Transfers Overall transfer level: Needs  assistance Equipment used: 1 person hand held assist Transfers: Squat Pivot Transfers     Squat pivot transfers: Max assist     General transfer comment: Max A for squat pivot to recliner. Max encouragement and cues for sequencing. Drop of L recliner arm for safety. Blocking of knees    Balance Overall balance assessment: Needs assistance Sitting-balance support: Single extremity supported;Feet supported Sitting balance-Leahy Scale: Poor Sitting balance - Comments: Reliant on LUE holding bedrail for support.    Standing balance support: Single extremity supported Standing balance-Leahy Scale: Poor Standing balance comment: Requiring physical A                           ADL either performed or assessed with clinical judgement   ADL Overall ADL's : Needs assistance/impaired Eating/Feeding: Set up;Sitting   Grooming: Set up;Sitting;Supervision/safety   Upper Body Bathing: Minimal assistance;Sitting   Lower Body Bathing: Maximal assistance;Sit to/from stand   Upper Body Dressing : Minimal assistance;Sitting   Lower Body Dressing: Maximal assistance;Sit to/from stand Lower Body Dressing Details (indicate cue type and reason): Donned socks Toilet Transfer: Maximal assistance;Squat-pivot (simulated to recliner) Toilet Transfer Details (indicate cue type and reason): Max A for squat pivot to recliner. Max encouragement and cues for sequencing. Drop of L recliner arm for safety. Blocking of knees         Functional mobility during ADLs: Maximal assistance (squat pivot) General ADL Comments: weakness, poor balance, decreased cognition, poor activity tolerance     Vision Baseline Vision/History: Wears glasses Wears Glasses: At all times Patient Visual Report: No change from  baseline       Perception     Praxis      Pertinent Vitals/Pain Pain Assessment: No/denies pain     Hand Dominance Right   Extremity/Trunk Assessment Upper Extremity Assessment Upper  Extremity Assessment: LUE deficits/detail LUE Deficits / Details: NWB with distal ulnar fx. ace wrap in place. Full elbow and finger AROM   Lower Extremity Assessment Lower Extremity Assessment: Defer to PT evaluation   Cervical / Trunk Assessment Cervical / Trunk Assessment: Kyphotic   Communication Communication Communication: HOH   Cognition Arousal/Alertness: Awake/alert Behavior During Therapy: WFL for tasks assessed/performed Overall Cognitive Status: No family/caregiver present to determine baseline cognitive functioning Area of Impairment: Orientation;Safety/judgement;Problem solving;Following commands;Memory;Awareness                 Orientation Level: Disoriented to;Time Current Attention Level: Sustained Memory: Decreased recall of precautions;Decreased short-term memory Following Commands: Follows one step commands with increased time Safety/Judgement: Decreased awareness of safety;Decreased awareness of deficits Awareness: Intellectual Problem Solving: Slow processing;Requires verbal cues;Requires tactile cues;Difficulty sequencing General Comments: Baseline dementia. Following simple commands with increased time. Also difficult to fully assess cognition as pt HOH   General Comments  SpO2 >83% on 25L via heated HFNC and 15L via NRB. Pt dropping to 83-79 without NRB mask. RR elevating to 30s with activity.    Exercises     Shoulder Instructions      Home Living Family/patient expects to be discharged to:: Private residence Living Arrangements: Children Available Help at Discharge: Family;Available 24 hours/day Type of Home: House Home Access: Stairs to enter [employer] of Steps: 2 Entrance Stairs-Rails: Left Home Layout: One level     Bathroom Shower/Tub: Teacher, early years/pre: Standard Bathroom Accessibility: No   Home Equipment: Environmental consultant - 2 wheels;Cane - single point;Shower seat;Walker - 4 wheels;Other (comment) (home O2, lt  platform on walker)   Additional Comments: Information collected from PT eval and chart review as pt having difficulty answering questions due to [facility]      Prior Functioning/Environment Level of Independence: Needs assistance  Gait / Transfers Assistance Needed: Assist for ambulation at time of DC 8/9 ADL's / Homemaking Assistance Needed: reports doing BADL at mod I level with DME and will cook clean, but at slow pace Communication / Swallowing Assistance Needed: HOH (R ear is best) Comments: Uses 3L O2 at home but only at night. Between recent hospitalization and this visit patient states she was not able to walk, son could not help her get up         OT Problem List: Decreased strength;Decreased knowledge of use of DME or AE;Decreased knowledge of precautions;Decreased activity tolerance;Impaired UE functional use;Impaired balance (sitting and/or standing);Decreased safety awareness;Decreased cognition      OT Treatment/Interventions: Self-care/ADL training;Therapeutic exercise;Patient/family education;Balance training;Therapeutic activities;DME and/or AE instruction;Cognitive remediation/compensation    OT Goals(Current goals can be found in the care plan section) Acute Rehab OT Goals Patient Stated Goal: go home OT Goal Formulation: With patient Time For Goal Achievement: 02/03/20 Potential to Achieve Goals: Fair  OT Frequency: Min 2X/week   Barriers to D/C:            Co-evaluation              AM-PAC OT "6 Clicks" Daily Activity     Outcome Measure Help from another person eating meals?: A Little Help from another person taking care of personal grooming?: A Little Help from another person toileting, which includes using toliet, bedpan, or urinal?:  A Lot Help from another person bathing (including washing, rinsing, drying)?: A Lot Help from another person to put on and taking off regular upper body clothing?: A Little Help from another person to put on and taking off  regular lower body clothing?: A Lot 6 Click Score: 15   End of Session Equipment Utilized During Treatment: Gait belt;Oxygen (25L heated HFNC and 15L NRB) Nurse Communication: Mobility status  Activity Tolerance: Patient limited by fatigue Patient left: with call bell/phone within reach;in chair;with chair alarm set  OT Visit Diagnosis: Unsteadiness on feet (R26.81);Repeated falls (R29.6);History of falling (Z91.81);Muscle weakness (generalized) (M62.81);Other symptoms and signs involving cognitive function                Time: 0921-0955 OT Time Calculation (min): 34 min Charges:  OT General Charges $OT Visit: 1 Visit OT Evaluation $OT Eval Moderate Complexity: 1 Mod OT Treatments $Self Care/Home Management : 23-37 mins  Delmos Velaquez MSOT, OTR/L Acute Rehab Pager: [phone] Office: [place] 02/03/2020, 2:22 PM

## 2020-02-03 NOTE — Progress Notes (Addendum)
PROGRESS NOTE                                                                                                                                                                                                             Patient Demographics:    Sydney Phillips, is a 84 y.o. female, DOB - [birth year], OZH:[id number]  Outpatient Primary MD for the patient is Laurey Morale, MD   Admit date - 01/30/2020   LOS - 4  Chief Complaint  Patient presents with  . Altered Mental Status    covid +       Brief Narrative: Patient is a 84 y.o. female with PMHx of chronic systolic heart failure s/p CRT, ventricular tachycardia, COPD on home O2-mostly at night, HTN, DM-2, CKD stage IIIb-recently hospitalized from 8/4-8/9 for hypoxia from COVID-19-admitted by PCCM to the ICU for profound hypoxemia requiring heated high flow/NRB due to ongoing Covid pneumonitis with progressive fibrotic changes.  See below for further details.  Significant Events: 7/29-8/1>> admit to [facility] by cardiology for decompensated heart failure 8/5>> COVID-19 positive 8/4-8/9>> hospitalized for hypoxia due to COVID-19 pneumonia-discharged on home O2 8/16>> readmitted to ICU for severe hypoxemia 8/20>> transfer to [facility]  Significant studies: 01/31/2020>> Echo: EF 62-95%, grade 2 diastolic dysfunction 2/84/1324>> chest x-ray: Widespread interstitial/airspace opacity bilaterally. 01/30/2020>> chest x-ray: New extensive patchy opacities throughout both lungs. 01/19/2020>>Chest x-ray: Cardiomegaly with bilateral interstitial prominence. 08/24/2019>> echo: EF 20-25%  COVID-19 medications: Remdesivir: 8/5>> 8/9 Steroids: 8/16>>  Antibiotics: Cefepime: 8/16>> Vancomycin: 8/16>> 8/18  Microbiology data: 8/16>> blood culture: No growth 8/5 >>blood culture: No growth  Procedures: None  Consults: PCCM  DVT prophylaxis: enoxaparin (LOVENOX) injection 30 mg Start: 01/31/20  1400 SCDs Start: 01/31/20 4010     Subjective:    Sydney Phillips this morning was lying comfortably in bed-she had a good night sleep-she denies any major complaints-denies any worsening of her baseline shortness of breath.   Assessment  & Plan :   Acute on chronic hypoxic hypoxic Resp Failure (on home O2 2 L nocturnally) due to Covid 19 Viral pneumonia with progressive fibrotic changes (organizing pneumonia) superimposed on HCAP: Remains with severe hypoxemia-on heated high flow and NRB-however is comfortable and not in any distress.  Continue Solu-Medrol-from 8/21-we will transition to tapering prednisone as recommended by PCCM.  Remains on empiric cefepime.  Her  overall prognosis is poor-she is a DNR-we will start engaging family for further goals of care discussion in the event she deteriorates further.  Fever: afebrile O2 requirements:  SpO2: 90 % O2 Flow Rate (L/min): 25 L/min FiO2 (%): 100 %   COVID-19 Labs: No results for input(s): DDIMER, FERRITIN, LDH, CRP in the last 72 hours.     Component Value Date/Time   BNP 319.6 (H) 01/30/2020 1723   BNP 540.2 (H) 08/07/2016 1433    No results for input(s): PROCALCITON in the last 168 hours.  Lab Results  Component Value Date   SARSCOV2NAA POSITIVE (A) 01/19/2020   [place] NEGATIVE 01/12/2020   [place] NEGATIVE 12/11/2019   [place] NEGATIVE 12/05/2019     Prone/Incentive Spirometry: encouraged incentive spirometry use 3-4/hour.  Chronic systolic heart failure: Volume status is stable-continue Demadex and Aldactone.  CKD stage IIIb: Creatinine not very far from baseline-follow periodically.  CAD: No anginal symptoms-continue aspirin/Plavix/beta-blocker follow.  Last [facility] May 2021 showed stable anatomy.  PAD: History of subclavian stenting in 2013-stable.  History of ventricular tachycardia-s/p ICD shocks prompting her last hospitalization: Continue telemetry monitoring-remains on amiodarone.  Follow  magnesium and potassium.  DM-2 (A1c 6.7 on 7/31) with uncontrolled hyperglycemia secondary to steroids: CBG's on the higher side-add 10 units of Lantus-continue SSI and follow.    Recent Labs    02/02/20 1646 02/02/20 2116 02/03/20 0754  GLUCAP 267* 264* 236*    Depression/anxiety: Appears stable-continue Cymbalta and Xanax.  Nondisplaced left wrist fracture: Continue with splint-WABT through elbow.  Follow-up with Dr. Jeannie Fend in 2 weeks.  Was evaluated by orthopedics on 7/29.  Debility/deconditioning: Secondary to acute illness-evaluated by PT with recommendations for home health on discharge.  ABG:    Component Value Date/Time   PHART 7.432 12/05/2019 1343   PCO2ART 53.9 (H) 12/05/2019 1343   PO2ART 94 12/05/2019 1343   HCO3 33.0 (H) 01/30/2020 1726   TCO2 34 (H) 01/30/2020 1726   ACIDBASEDEF 6.0 (H) 04/06/2016 1242   O2SAT 51.0 01/30/2020 1726    Vent Settings: N/A FiO2 (%):  [100 %] 100 %  Condition - Stable  Family Communication  : Voicemail left for son-[name] ([phone]) on 8/19  Addendum: Son called back-spoke to him-he is aware of patient's tenuous clinical condition-poor prognosis-and potential for hospice if patient's clinical condition deteriorated.  Code Status :  Full Code  Diet :  Diet Order            Diet regular Room service appropriate? Yes; Fluid consistency: Thin  Diet effective now                  Disposition Plan  :   Status is: Inpatient  Remains inpatient appropriate because:Inpatient level of care appropriate due to severity of illness  Dispo: The patient is from: Home              Anticipated d/c is to: Home              Anticipated d/c date is: 1 days-[place] 8/9              Patient currently is not medically stable to d/c.   Barriers to discharge: Hypoxia requiring O2 supplementation/complete 5 days of IV Remdesivir  Antimicorbials  :    Anti-infectives (From admission, onward)   Start     Dose/Rate Route Frequency  Ordered Stop   01/31/20 2100  vancomycin (VANCOCIN) IVPB 1000 mg/200 mL premix  Status:  Discontinued  1,000 mg 200 mL/hr over 60 Minutes Intravenous Every 24 hours 01/30/20 1931 02/02/20 0928   01/31/20 2000  ceFEPIme (MAXIPIME) 2 g in sodium chloride 0.9 % 100 mL IVPB        2 g 200 mL/hr over 30 Minutes Intravenous Every 24 hours 01/30/20 1931     01/30/20 1830  vancomycin (VANCOREADY) IVPB 1500 mg/300 mL        1,500 mg 150 mL/hr over 120 Minutes Intravenous  Once 01/30/20 1823 01/30/20 2350   01/30/20 1830  ceFEPIme (MAXIPIME) 2 g in sodium chloride 0.9 % 100 mL IVPB        2 g 200 mL/hr over 30 Minutes Intravenous  Once 01/30/20 1823 01/30/20 1955      Inpatient Medications  Scheduled Meds: . allopurinol  300 mg Oral Daily  . amiodarone  200 mg Oral BID  . vitamin C  250 mg Oral Daily  . aspirin  81 mg Oral Daily  . bisoprolol  5 mg Oral Daily  . Chlorhexidine Gluconate Cloth  6 each Topical Daily  . cholecalciferol  4,000 Units Oral Daily  . clopidogrel  75 mg Oral Daily  . DULoxetine  60 mg Oral BID  . enoxaparin (LOVENOX) injection  30 mg Subcutaneous Q24H  . insulin aspart  0-15 Units Subcutaneous TID WC  . Ipratropium-Albuterol  1 puff Inhalation TID  . loratadine  10 mg Oral Daily  . magnesium oxide  400 mg Oral Daily  . methylPREDNISolone (SOLU-MEDROL) injection  80 mg Intravenous Q12H  . pantoprazole  40 mg Oral Q1200  . potassium chloride SA  20 mEq Oral Daily  . spironolactone  12.5 mg Oral Daily  . torsemide  80 mg Oral BID  . vitamin B-12  2,000 mcg Oral Daily   Continuous Infusions: . ceFEPime (MAXIPIME) IV 2 g (02/02/20 2106)   PRN Meds:.ALPRAZolam, docusate sodium, docusate sodium, polyethylene glycol   Time Spent in minutes  25   See all Orders from today for further details   Oren Binet M.D on 02/03/2020 at 11:35 AM  To page go to [email or web address] - use universal password  [facility] -  Office  [phone]    Objective:     Vitals:   02/03/20 0441 02/03/20 0447 02/03/20 0616 02/03/20 0752  BP:  (!) 144/60  137/63  Pulse:  60  60  Resp:  20  18  Temp:  97.6 F (36.4 C)  (!) 97.4 F (36.3 C)  TempSrc:  Axillary  Oral  SpO2: (!) 88% (!) 88% 95% 90%  Weight:        Wt Readings from Last 3 Encounters:  02/02/20 81.3 kg  01/23/20 81.1 kg  01/15/20 81.1 kg     Intake/Output Summary (Last 24 hours) at 02/03/2020 1135 Last data filed at 02/03/2020 0847 Gross per 24 hour  Intake 480 ml  Output 2550 ml  Net -2070 ml     Physical Exam Gen Exam:Alert awake-not in any distress HEENT:atraumatic, normocephalic Chest: B/L clear to auscultation anteriorly CVS:S1S2 regular Abdomen:soft non tender, non distended Extremities:no edema Neurology: Non focal Skin: no rash   Data Review:    CBC Recent Labs  Lab 01/30/20 1723 01/30/20 1726 01/31/20 0404 01/31/20 1309 02/01/20 0447  WBC 23.8*  --  12.9* 14.7* 21.5*  HGB 16.3* 17.7* 14.5 15.0 14.6  HCT 50.6* 52.0* 45.5 46.9* 44.9  PLT 323  --  204 214 206  MCV 97.5  --  98.1 97.1 96.8  MCH  31.4  --  31.3 31.1 31.5  MCHC 32.2  --  31.9 32.0 32.5  RDW 15.1  --  15.3 15.2 15.1  LYMPHSABS 0.5*  --   --   --   --   MONOABS 1.4*  --   --   --   --   EOSABS 0.0  --   --   --   --   BASOSABS 0.1  --   --   --   --     Chemistries  Recent Labs  Lab 01/30/20 1723 01/30/20 1723 01/30/20 1726 01/31/20 0404 01/31/20 1309 02/01/20 0447 02/02/20 1025 02/03/20 0945  NA 134*   < > 132* 134*  --  134* 133* 131*  K 4.5   < > 5.1 4.4  --  4.2 4.4 4.8  CL 90*  --   --  95*  --  96* 97* 90*  CO2 28  --   --  25  --  25 25 24   GLUCOSE 147*  --   --  253*  --  205* 325* 323*  BUN 49*  --   --  50*  --  58* 65* 70*  CREATININE 2.05*   < >  --  1.96* 1.98* 1.86* 1.94* 1.89*  CALCIUM 9.4  --   --  9.0  --  9.0 9.1 9.6  MG  --   --   --  1.9  --  1.9  --   --   AST 41  --   --   --   --   --  37 27  ALT 38  --   --   --   --   --  33 33  ALKPHOS 85  --    --   --   --   --  63 72  BILITOT 1.1  --   --   --   --   --  0.5 0.7   < > = values in this interval not displayed.   ------------------------------------------------------------------------------------------------------------------ No results for input(s): CHOL, HDL, LDLCALC, TRIG, CHOLHDL, LDLDIRECT in the last 72 hours.  Lab Results  Component Value Date   HGBA1C 6.7 (H) 01/14/2020   ------------------------------------------------------------------------------------------------------------------ No results for input(s): TSH, T4TOTAL, T3FREE, THYROIDAB in the last 72 hours.  Invalid input(s): FREET3 ------------------------------------------------------------------------------------------------------------------ No results for input(s): VITAMINB12, FOLATE, FERRITIN, TIBC, IRON, RETICCTPCT in the last 72 hours.  Coagulation profile No results for input(s): INR, PROTIME in the last 168 hours.  No results for input(s): DDIMER in the last 72 hours.  Cardiac Enzymes No results for input(s): CKMB, TROPONINI, MYOGLOBIN in the last 168 hours.  Invalid input(s): CK ------------------------------------------------------------------------------------------------------------------    Component Value Date/Time   BNP 319.6 (H) 01/30/2020 1723   BNP 540.2 (H) 08/07/2016 1433    Micro Results Recent Results (from the past 240 hour(s))  Blood Culture (routine x 2)     Status: None (Preliminary result)   Collection Time: 01/30/20  5:23 PM   Specimen: BLOOD RIGHT HAND  Result Value Ref Range Status   Specimen Description BLOOD RIGHT HAND  Final   Special Requests   Final    BOTTLES DRAWN AEROBIC ONLY Blood Culture results may not be optimal due to an inadequate volume of blood received in culture bottles   Culture   Final    NO GROWTH 4 DAYS Performed at [facility], [place] [address] [place], [place] [address]    Report Status PENDING  Incomplete  Blood Culture (routine x 2)      Status: None (Preliminary result)   Collection Time: 01/30/20  6:26 PM   Specimen: BLOOD RIGHT WRIST  Result Value Ref Range Status   Specimen Description BLOOD RIGHT WRIST  Final   Special Requests   Final    BOTTLES DRAWN AEROBIC ONLY Blood Culture results may not be optimal due to an inadequate volume of blood received in culture bottles   Culture   Final    NO GROWTH 4 DAYS Performed at [facility], [place] [address] [place], [place] [address]    Report Status PENDING  Incomplete    Radiology Reports DG Chest 2 View  Result Date: 01/19/2020 CLINICAL DATA:  Fatigue.  History of CHF. EXAM: CHEST - 2 VIEW COMPARISON:  12/05/2019.  09/30/2019. FINDINGS: AICD in stable position. Again noted cardiomegaly with bilateral interstitial prominence suggesting CHF. Small bilateral pleural effusion may be present. Calcified left lung nodule consistent with granuloma again noted. No pneumothorax. Degenerative change thoracic spine. Lower thoracic spine compression fracture noted on today's exam. IMPRESSION: 1. AICD in stable position. Cardiomegaly with bilateral interstitial prominence again noted suggesting CHF. Similar findings noted on prior exam. Small bilateral pleural effusions may be present. 2. Lower thoracic vertebral body compression fracture may be present. Electronically Signed   By: Marcello Moores  Register   On: 01/19/2020 05:14   DG Chest 2 View  Result Date: 01/12/2020 CLINICAL DATA:  Chest pain. EXAM: CHEST - 2 VIEW COMPARISON:  12/05/2019. FINDINGS: AICD in stable position. Again noted cardiomegaly with pulmonary venous congestion bilateral interstitial prominence and small bilateral pleural effusions. Findings consistent CHF. Similar findings noted on prior exam. Calcified pulmonary nodules noted consistent prior granulomas disease. No pneumothorax. IMPRESSION: AICD in stable position. Cardiomegaly with pulmonary venous congestion, bilateral interstitial prominence, and small bilateral  pleural effusions again noted. Findings again consistent with CHF. Similar findings noted on prior exam. Electronically Signed   By: Marcello Moores  Register   On: 01/12/2020 05:20   DG Wrist Complete Left  Result Date: 01/12/2020 CLINICAL DATA:  Persistent left wrist pain after fall 1 month ago EXAM: LEFT WRIST - COMPLETE 3+ VIEW COMPARISON:  12/26/2019 FINDINGS: No definite acute fracture is identified. Possible subtle horizontal lucency at the distal ulna could reflect a subacute nondisplaced fracture. No definite fracture line is seen in at the radial styloid. Mildly widened scapholunate interval of 3 mm. Similar degree of osteoarthritis, most pronounced at the first [facility] and triscaphe joints. Bones are demineralized. Soft tissue swelling persists. IMPRESSION: 1. Possible subtle subacute nondisplaced fracture at the distal ulna. Correlate with point tenderness. 2. No definite acute or healing distal radial fracture is identified. 3. Mildly widened scapholunate interval of 3 mm suggesting underlying ligamentous injury. Electronically Signed   By: Davina Poke D.O.   On: 01/12/2020 08:47   DG Chest Port 1 View  Result Date: 01/31/2020 CLINICAL DATA:  Hypoxia.  Reported COVID-19 positive EXAM: PORTABLE CHEST 1 VIEW COMPARISON:  January 30, 2020 and January 19, 2020 FINDINGS: Extensive patchy interstitial and airspace opacity bilaterally is essentially stable compared to 1 day prior with significant progression of disease since 2 weeks prior. Mild consolidation in the left base is stable. There is cardiomegaly with pulmonary vascularity within normal limits. Pacemaker leads attached to right atrium, right ventricle, and coronary sinus. No adenopathy appreciable. There is aortic atherosclerosis. No bone lesions. IMPRESSION: Widespread interstitial and airspace opacity bilaterally, stable compared to 1 day prior. Mild consolidation left  base. No new opacity evident. Stable cardiac prominence. Stable pacemaker lead  positioning. No adenopathy appreciable. Aortic Atherosclerosis (ICD10-I70.0). Electronically Signed   By: Lowella Grip III M.D.   On: 01/31/2020 11:38   DG Chest Port 1 View  Result Date: 01/30/2020 CLINICAL DATA:  COVID-19 positive EXAM: PORTABLE CHEST 1 VIEW COMPARISON:  01/26/2020 chest radiograph. FINDINGS: Stable 3 lead left subclavian ICD. Stable cardiomediastinal silhouette with moderate cardiomegaly. No pneumothorax. No pleural effusion. Extensive patchy opacities throughout both lungs are new. IMPRESSION: New extensive patchy opacities throughout both lungs, differential includes COVID-19 pneumonia and/or cardiogenic pulmonary edema given moderate cardiomegaly. Electronically Signed   By: Ilona Sorrel M.D.   On: 01/30/2020 18:07   ECHOCARDIOGRAM LIMITED  Result Date: 01/31/2020    ECHOCARDIOGRAM LIMITED REPORT   Patient Name:   IVYONNA HOELZEL Simi Date of Exam: 01/31/2020 Medical Rec #:  [id number]         Height:       65.0 in Accession #:    [id number]        Weight:       178.8 lb Date of Birth:  [birth year]         BSA:          1.886 m Patient Age:    24 years          BP:           133/63 mmHg Patient Gender: F                 HR:           59 bpm. Exam Location:  Inpatient Procedure: Intracardiac Opacification Agent, Limited Echo, Limited Color Doppler            and Cardiac Doppler Indications:    Dyspnea 786.09 / R06.00  History:        Patient has prior history of Echocardiogram examinations, most                 recent 08/24/2019. CHF, Defibrillator, COPD, Pulmonary HTN and                 PAD, Arrythmias:LBBB; Risk Factors:Former Smoker, Hypertension                 and Dyslipidemia. Chronic hypoxemic respiratory failure                 secondary to COVID-19, sepsis, chronic kidney disease.  Sonographer:    Darlina Sicilian RDCS Referring Phys: [place]  1. Left ventricular ejection fraction, by estimation, is 25 to 30%. The left ventricle has severely decreased  function. The left ventricle demonstrates global hypokinesis. Left ventricular diastolic parameters are consistent with Grade II diastolic dysfunction (pseudonormalization). Elevated left atrial pressure.  2. Right ventricular systolic function is normal. The right ventricular size is normal. There is normal pulmonary artery systolic pressure. The estimated right ventricular systolic pressure is 47.4 mmHg.  3. The mitral valve is normal in structure. Mild mitral valve regurgitation.  4. The aortic valve was not well visualized. Aortic valve regurgitation is not visualized. Mild to moderate aortic valve sclerosis/calcification is present, without any evidence of aortic stenosis.  5. The inferior vena cava is normal in size with greater than 50% respiratory variability, suggesting right atrial pressure of 3 mmHg. FINDINGS  Left Ventricle: Left ventricular ejection fraction, by estimation, is 25 to 30%. The left ventricle has severely decreased function. The left ventricle demonstrates global hypokinesis. Definity contrast  agent was given IV to delineate the left ventricular endocardial borders. Elevated left atrial pressure. Right Ventricle: The right ventricular size is normal. Right ventricular systolic function is normal. There is normal pulmonary artery systolic pressure. The tricuspid regurgitant velocity is 2.53 m/s, and with an assumed right atrial pressure of 3 mmHg,  the estimated right ventricular systolic pressure is 59.7 mmHg. Pericardium: There is no evidence of pericardial effusion. Mitral Valve: The mitral valve is normal in structure. Mild mitral valve regurgitation. Tricuspid Valve: The tricuspid valve is normal in structure. Tricuspid valve regurgitation is trivial. Aortic Valve: The aortic valve was not well visualized. Aortic valve regurgitation is not visualized. Mild to moderate aortic valve sclerosis/calcification is present, without any evidence of aortic stenosis. Venous: The inferior vena  cava is normal in size with greater than 50% respiratory variability, suggesting right atrial pressure of 3 mmHg. LEFT VENTRICLE PLAX 2D LVIDd:         5.70 cm      Diastology LVIDs:         5.00 cm      LV e' lateral:   2.47 cm/s LV PW:         0.90 cm      LV E/e' lateral: 32.1 LV IVS:        1.10 cm      LV e' medial:    2.40 cm/s LVOT diam:     1.90 cm      LV E/e' medial:  33.0 LV SV:         44 LV SV Index:   23 LVOT Area:     2.84 cm  LV Volumes (MOD) LV vol d, MOD A2C: 146.0 ml LV vol d, MOD A4C: 185.5 ml LV vol s, MOD A2C: 115.0 ml LV vol s, MOD A4C: 117.5 ml LV SV MOD A2C:     31.0 ml LV SV MOD A4C:     185.5 ml LV SV MOD BP:      47.9 ml LEFT ATRIUM         Index LA diam:    3.30 cm 1.75 cm/m  AORTIC VALVE LVOT Vmax:   67.20 cm/s LVOT Vmean:  47.900 cm/s LVOT VTI:    0.156 m MITRAL VALVE                 TRICUSPID VALVE MV Area (PHT): 3.65 cm      TR Peak grad:   25.6 mmHg MV Decel Time: 208 msec      TR Vmax:        253.00 cm/s MR Peak grad:    123.7 mmHg MR Mean grad:    64.0 mmHg   SHUNTS MR Vmax:         556.00 cm/s Systemic VTI:  0.16 m MR Vmean:        367.0 cm/s  Systemic Diam: 1.90 cm MR PISA:         1.01 cm MR PISA Eff ROA: 7 mm MR PISA Radius:  0.40 cm MV E velocity: 79.30 cm/s MV A velocity: 91.30 cm/s MV E/A ratio:  0.87 Oswaldo Milian MD Electronically signed by Oswaldo Milian MD Signature Date/Time: 01/31/2020/3:57:07 PM    Final

## 2020-02-04 LAB — CBC
HCT: 48.7 % — ABNORMAL HIGH (ref 36.0–46.0)
Hemoglobin: 15.9 g/dL — ABNORMAL HIGH (ref 12.0–15.0)
MCH: 31 pg (ref 26.0–34.0)
MCHC: 32.6 g/dL (ref 30.0–36.0)
MCV: 94.9 fL (ref 80.0–100.0)
Platelets: 247 10*3/uL (ref 150–400)
RBC: 5.13 MIL/uL — ABNORMAL HIGH (ref 3.87–5.11)
RDW: 14.6 % (ref 11.5–15.5)
WBC: 16.7 10*3/uL — ABNORMAL HIGH (ref 4.0–10.5)
nRBC: 0 % (ref 0.0–0.2)

## 2020-02-04 LAB — COMPREHENSIVE METABOLIC PANEL
ALT: 31 U/L (ref 0–44)
AST: 18 U/L (ref 15–41)
Albumin: 2.5 g/dL — ABNORMAL LOW (ref 3.5–5.0)
Alkaline Phosphatase: 74 U/L (ref 38–126)
Anion gap: 16 — ABNORMAL HIGH (ref 5–15)
BUN: 86 mg/dL — ABNORMAL HIGH (ref 8–23)
CO2: 26 mmol/L (ref 22–32)
Calcium: 9.7 mg/dL (ref 8.9–10.3)
Chloride: 88 mmol/L — ABNORMAL LOW (ref 98–111)
Creatinine, Ser: 2.21 mg/dL — ABNORMAL HIGH (ref 0.44–1.00)
GFR calc Af Amer: 23 mL/min — ABNORMAL LOW (ref >60)
GFR calc non Af Amer: 20 mL/min — ABNORMAL LOW (ref >60)
Glucose, Bld: 336 mg/dL — ABNORMAL HIGH (ref 70–99)
Potassium: 4.8 mmol/L (ref 3.5–5.1)
Sodium: 130 mmol/L — ABNORMAL LOW (ref 135–145)
Total Bilirubin: 0.8 mg/dL (ref 0.3–1.2)
Total Protein: 6.3 g/dL — ABNORMAL LOW (ref 6.5–8.1)

## 2020-02-04 LAB — C-REACTIVE PROTEIN: CRP: 2.8 mg/dL — ABNORMAL HIGH (ref <1.0)

## 2020-02-04 LAB — CULTURE, BLOOD (ROUTINE X 2)
Culture: NO GROWTH
Culture: NO GROWTH

## 2020-02-04 LAB — GLUCOSE, CAPILLARY
Glucose-Capillary: 364 mg/dL — ABNORMAL HIGH (ref 70–99)
Glucose-Capillary: 337 mg/dL — ABNORMAL HIGH (ref 70–99)
Glucose-Capillary: 386 mg/dL — ABNORMAL HIGH (ref 70–99)

## 2020-02-04 LAB — D-DIMER, QUANTITATIVE: D-Dimer, Quant: 1.94 ug/mL-FEU — ABNORMAL HIGH (ref 0.00–0.50)

## 2020-02-04 LAB — MAGNESIUM: Magnesium: 2.3 mg/dL (ref 1.7–2.4)

## 2020-02-04 MED ORDER — PREDNISONE 20 MG PO TABS
50.0000 mg | ORAL_TABLET | Freq: Every day | ORAL | Status: DC
  Administered 2020-02-05 – 2020-02-06 (×2): 50 mg via ORAL
  Filled 2020-02-04 (×2): qty 2

## 2020-02-04 MED ORDER — INSULIN GLARGINE 100 UNIT/ML ~~LOC~~ SOLN
14.0000 [IU] | Freq: Every day | SUBCUTANEOUS | Status: DC
  Administered 2020-02-05 – 2020-02-06 (×2): 14 [IU] via SUBCUTANEOUS
  Filled 2020-02-04 (×2): qty 0.14

## 2020-02-04 MED ORDER — ACETAMINOPHEN 325 MG PO TABS
650.0000 mg | ORAL_TABLET | Freq: Once | ORAL | Status: AC
  Administered 2020-02-04: 650 mg via ORAL
  Filled 2020-02-04: qty 2

## 2020-02-04 MED ORDER — SODIUM CHLORIDE 0.9 % IV SOLN: INTRAVENOUS | Status: DC | PRN

## 2020-02-04 MED ORDER — INSULIN ASPART 100 UNIT/ML ~~LOC~~ SOLN
8.0000 [IU] | Freq: Once | SUBCUTANEOUS | Status: AC
  Administered 2020-02-04: 8 [IU] via SUBCUTANEOUS

## 2020-02-04 NOTE — Progress Notes (Signed)
   02/03/20 2340  Assess: MEWS Score  Temp 98.8 F (37.1 C)  BP (!) 127/91  Pulse Rate 60  ECG Heart Rate 60  Resp (!) 28  Level of Consciousness Alert  SpO2 90 %  O2 Device HFNC  Assess: MEWS Score  MEWS Temp 0  MEWS Systolic 0  MEWS Pulse 0  MEWS RR 2  MEWS LOC 0  MEWS Score 2  MEWS Score Color Yellow  Assess: if the MEWS score is Yellow or Red  Were vital signs taken at a resting state? Yes  Focused Assessment No change from prior assessment  Early Detection of Sepsis Score *See Row Information* Low  MEWS guidelines implemented *See Row Information* Yes  Treat  MEWS Interventions Escalated (See documentation below)  Take Vital Signs  Increase Vital Sign Frequency  Yellow: Q 2hr X 2 then Q 4hr X 2, if remains yellow, continue Q 4hrs  Escalate  MEWS: Escalate Yellow: discuss with charge nurse/RN and consider discussing with provider and RRT  Notify: Charge Nurse/RN  Name of Charge Nurse/RN Notified Ronalee Belts RN  Date Charge Nurse/RN Notified 02/02/20  Time Charge Nurse/RN Notified 2345

## 2020-02-04 NOTE — Progress Notes (Addendum)
PROGRESS NOTE                                                                                                                                                                                                             Patient Demographics:    Sydney Phillips, is a 84 y.o. female, DOB - [birth year], YJG:[id number]  Outpatient Primary MD for the patient is Laurey Morale, MD   Admit date - 01/30/2020   LOS - 5  Chief Complaint  Patient presents with  . Altered Mental Status    covid +       Brief Narrative: Patient is a 84 y.o. female with PMHx of chronic systolic heart failure s/p CRT, ventricular tachycardia, COPD on home O2-mostly at night, HTN, DM-2, CKD stage IIIb-recently hospitalized from 8/4-8/9 for hypoxia from COVID-19-admitted by PCCM to the ICU for profound hypoxemia requiring heated high flow/NRB due to ongoing Covid pneumonitis with progressive fibrotic changes.  See below for further details.  Significant Events: 7/29-8/1>> admit to [facility] by cardiology for decompensated heart failure 8/5>> COVID-19 positive 8/4-8/9>> hospitalized for hypoxia due to COVID-19 pneumonia-discharged on home O2 8/16>> readmitted to ICU for severe hypoxemia 8/20>> transfer to [facility]  Significant studies: 01/31/2020>> Echo: EF 84-41%, grade 2 diastolic dysfunction 12/25/7869>> chest x-ray: Widespread interstitial/airspace opacity bilaterally. 01/30/2020>> chest x-ray: New extensive patchy opacities throughout both lungs. 01/19/2020>>Chest x-ray: Cardiomegaly with bilateral interstitial prominence. 08/24/2019>> echo: EF 20-25%  COVID-19 medications: Remdesivir: 8/5>> 8/9 Steroids: 8/16>>  Antibiotics: Cefepime: 8/16>> Vancomycin: 8/16>> 8/18  Microbiology data: 8/16>> blood culture: No growth 8/5 >>blood culture: No growth  Procedures: None  Consults: PCCM  DVT prophylaxis: enoxaparin (LOVENOX) injection 30 mg Start: 01/31/20  1400 SCDs Start: 01/31/20 0926     Subjective:   Unchanged-still on heated high flow and NRB. Comfortable. No major issues overnight.   Assessment  & Plan :   Acute on chronic hypoxic hypoxic Resp Failure (on home O2 2 L nocturnally) due to Covid 19 Viral pneumonia with progressive fibrotic changes (organizing pneumonia) superimposed on HCAP: Continues to have severe hypoxemia-remains on heated high flow and NRB. She is comfortable-stop Solu-Medrol-transition to tapering prednisone as recommended by PCCM. Remains on empiric cefepime. Her overall prognosis is poor-she is a DNR-discussed with son yesterday that if she deteriorates any further-we will have no other options apart from transitioning  to comfort measures.   Addendum: Patient is also on amiodarone-although fibrotic changes were thought to be related to COVID-19-uncertain whether amiodarone could be contributing.  Spoke with Dr. Lake Bells on 8/21-recommendations were to stop amiodarone-subsequently discussed with cardiology-Dr. Loura Halt recent history of V. tach/ICD firing-he reviewed chart-and was agreeable in stopping amiodarone.  She has an ICD in place in case she has V. tach.  We will monitor magnesium and potassium closely.  Check ESR with a.m. labs-but she has already been on steroids for the past several days.    Fever: afebrile O2 requirements:  SpO2: 90 % O2 Flow Rate (L/min): 40 L/min FiO2 (%): 100 %   COVID-19 Labs: Recent Labs    02/04/20 0923  DDIMER 1.94*  CRP 2.8*       Component Value Date/Time   BNP 319.6 (H) 01/30/2020 1723   BNP 540.2 (H) 08/07/2016 1433    No results for input(s): PROCALCITON in the last 168 hours.  Lab Results  Component Value Date   SARSCOV2NAA POSITIVE (A) 01/19/2020   [place] NEGATIVE 01/12/2020   [place] NEGATIVE 12/11/2019   [place] NEGATIVE 12/05/2019     Prone/Incentive Spirometry: encouraged incentive spirometry use 3-4/hour.  Chronic  systolic heart failure: Volume status is stable-holding diuretics today-she is -6.6 L so far. Follow.  AKI on CKD stage IIIb: Bump in creatinine today-holding diuretics-recheck electrolytes tomorrow.   CAD: No anginal symptoms-continue aspirin/Plavix/beta-blocker follow.  Last [facility] May 2021 showed stable anatomy.  PAD: History of subclavian stenting in 2013-stable.  History of ventricular tachycardia-s/p ICD shocks prompting her last hospitalization: Continue telemetry monitoring-remains on amiodarone.  Follow magnesium and potassium.  DM-2 (A1c 6.7 on 7/31) with uncontrolled hyperglycemia secondary to steroids: CBG's on the higher side-increase Lantus to 14 units, continue SSI-follow and optimize.  Recent Labs    02/03/20 1201 02/03/20 1607 02/03/20 2120  GLUCAP 386* 197* 280*    Depression/anxiety: Appears stable-continue Cymbalta and Xanax.  Nondisplaced left wrist fracture: Continue with splint-WABT through elbow.  Follow-up with Dr. Jeannie Fend in 2 weeks.  Was evaluated by orthopedics on 7/29.  Debility/deconditioning: Secondary to acute illness-evaluated by PT with recommendations for home health on discharge.  ABG:    Component Value Date/Time   PHART 7.432 12/05/2019 1343   PCO2ART 53.9 (H) 12/05/2019 1343   PO2ART 94 12/05/2019 1343   HCO3 33.0 (H) 01/30/2020 1726   TCO2 34 (H) 01/30/2020 1726   ACIDBASEDEF 6.0 (H) 04/06/2016 1242   O2SAT 51.0 01/30/2020 1726    Vent Settings: N/A FiO2 (%):  [100 %] 100 %  Condition - Stable  Family Communication  : Son-[name] ([phone])  Updated on 8/21    Code Status :  Full Code  Diet :  Diet Order            Diet regular Room service appropriate? Yes; Fluid consistency: Thin  Diet effective now                  Disposition Plan  :   Status is: Inpatient  Remains inpatient appropriate because:Inpatient level of care appropriate due to severity of illness  Dispo: The patient is from: Home               Anticipated d/c is to: Home              Anticipated d/c date is: 1 days-[place] 8/9              Patient currently is not medically stable  to d/c.   Barriers to discharge: Hypoxia requiring O2 supplementation  Antimicorbials  :    Anti-infectives (From admission, onward)   Start     Dose/Rate Route Frequency Ordered Stop   01/31/20 2100  vancomycin (VANCOCIN) IVPB 1000 mg/200 mL premix  Status:  Discontinued        1,000 mg 200 mL/hr over 60 Minutes Intravenous Every 24 hours 01/30/20 1931 02/02/20 0928   01/31/20 2000  ceFEPIme (MAXIPIME) 2 g in sodium chloride 0.9 % 100 mL IVPB        2 g 200 mL/hr over 30 Minutes Intravenous Every 24 hours 01/30/20 1931     01/30/20 1830  vancomycin (VANCOREADY) IVPB 1500 mg/300 mL        1,500 mg 150 mL/hr over 120 Minutes Intravenous  Once 01/30/20 1823 01/30/20 2350   01/30/20 1830  ceFEPIme (MAXIPIME) 2 g in sodium chloride 0.9 % 100 mL IVPB        2 g 200 mL/hr over 30 Minutes Intravenous  Once 01/30/20 1823 01/30/20 1955      Inpatient Medications  Scheduled Meds: . allopurinol  300 mg Oral Daily  . amiodarone  200 mg Oral BID  . vitamin C  250 mg Oral Daily  . aspirin  81 mg Oral Daily  . bisoprolol  5 mg Oral Daily  . Chlorhexidine Gluconate Cloth  6 each Topical Daily  . cholecalciferol  4,000 Units Oral Daily  . clopidogrel  75 mg Oral Daily  . DULoxetine  60 mg Oral BID  . enoxaparin (LOVENOX) injection  30 mg Subcutaneous Q24H  . insulin aspart  0-15 Units Subcutaneous TID WC  . insulin glargine  10 Units Subcutaneous Daily  . Ipratropium-Albuterol  1 puff Inhalation TID  . loratadine  10 mg Oral Daily  . magnesium oxide  400 mg Oral Daily  . methylPREDNISolone (SOLU-MEDROL) injection  80 mg Intravenous Q12H  . pantoprazole  40 mg Oral Q1200  . potassium chloride SA  20 mEq Oral Daily  . spironolactone  12.5 mg Oral Daily  . torsemide  80 mg Oral BID  . vitamin B-12  2,000 mcg Oral Daily   Continuous Infusions: .  ceFEPime (MAXIPIME) IV 2 g (02/03/20 1955)   PRN Meds:.ALPRAZolam, docusate sodium, docusate sodium, ondansetron (ZOFRAN) IV, polyethylene glycol   Time Spent in minutes  25   See all Orders from today for further details   Oren Binet M.D on 02/04/2020 at 11:23 AM  To page go to [email or web address] - use universal password  [facility] -  Office  [phone]    Objective:   Vitals:   02/04/20 0141 02/04/20 0307 02/04/20 0433 02/04/20 0758  BP: 130/72  (!) 154/71 (!) 149/72  Pulse: 60 60 60 60  Resp: (!) 24 19 (!) 22 18  Temp: 97.8 F (36.6 C)  98 F (36.7 C) 98.6 F (37 C)  TempSrc: Axillary  Oral Oral  SpO2: 90% 90% (!) 88% 90%  Weight:  80.1 kg      Wt Readings from Last 3 Encounters:  02/04/20 80.1 kg  01/23/20 81.1 kg  01/15/20 81.1 kg     Intake/Output Summary (Last 24 hours) at 02/04/2020 1123 Last data filed at 02/04/2020 0800 Gross per 24 hour  Intake 1156 ml  Output 2350 ml  Net -1194 ml     Physical Exam Gen Exam:Alert awake-not in any distress HEENT:atraumatic, normocephalic Chest: B/L clear to auscultation anteriorly CVS:S1S2 regular Abdomen:soft non tender, non distended  Extremities:no edema Neurology: Non focal Skin: no rash   Data Review:    CBC Recent Labs  Lab 01/30/20 1723 01/30/20 1723 01/30/20 1726 01/31/20 0404 01/31/20 1309 02/01/20 0447 02/04/20 0923  WBC 23.8*  --   --  12.9* 14.7* 21.5* 16.7*  HGB 16.3*   < > 17.7* 14.5 15.0 14.6 15.9*  HCT 50.6*   < > 52.0* 45.5 46.9* 44.9 48.7*  PLT 323  --   --  204 214 206 247  MCV 97.5  --   --  98.1 97.1 96.8 94.9  MCH 31.4  --   --  31.3 31.1 31.5 31.0  MCHC 32.2  --   --  31.9 32.0 32.5 32.6  RDW 15.1  --   --  15.3 15.2 15.1 14.6  LYMPHSABS 0.5*  --   --   --   --   --   --   MONOABS 1.4*  --   --   --   --   --   --   EOSABS 0.0  --   --   --   --   --   --   BASOSABS 0.1  --   --   --   --   --   --    < > = values in this interval not displayed.     Chemistries  Recent Labs  Lab 01/30/20 1723 01/30/20 1726 01/31/20 0404 01/31/20 0404 01/31/20 1309 02/01/20 0447 02/02/20 1025 02/03/20 0945 02/04/20 0923  NA 134*   < > 134*  --   --  134* 133* 131* 130*  K 4.5   < > 4.4  --   --  4.2 4.4 4.8 4.8  CL 90*   < > 95*  --   --  96* 97* 90* 88*  CO2 28   < > 25  --   --  '25 25 24 26  ' GLUCOSE 147*   < > 253*  --   --  205* 325* 323* 336*  BUN 49*   < > 50*  --   --  58* 65* 70* 86*  CREATININE 2.05*   < > 1.96*   < > 1.98* 1.86* 1.94* 1.89* 2.21*  CALCIUM 9.4   < > 9.0  --   --  9.0 9.1 9.6 9.7  MG  --   --  1.9  --   --  1.9  --   --  2.3  AST 41  --   --   --   --   --  37 27 18  ALT 38  --   --   --   --   --  33 33 31  ALKPHOS 85  --   --   --   --   --  63 72 74  BILITOT 1.1  --   --   --   --   --  0.5 0.7 0.8   < > = values in this interval not displayed.   ------------------------------------------------------------------------------------------------------------------ No results for input(s): CHOL, HDL, LDLCALC, TRIG, CHOLHDL, LDLDIRECT in the last 72 hours.  Lab Results  Component Value Date   HGBA1C 6.7 (H) 01/14/2020   ------------------------------------------------------------------------------------------------------------------ No results for input(s): TSH, T4TOTAL, T3FREE, THYROIDAB in the last 72 hours.  Invalid input(s): FREET3 ------------------------------------------------------------------------------------------------------------------ No results for input(s): VITAMINB12, FOLATE, FERRITIN, TIBC, IRON, RETICCTPCT in the last 72 hours.  Coagulation profile No results for input(s): INR, PROTIME in the last 168  hours.  Recent Labs    02/04/20 0923  DDIMER 1.94*    Cardiac Enzymes No results for input(s): CKMB, TROPONINI, MYOGLOBIN in the last 168 hours.  Invalid input(s): CK ------------------------------------------------------------------------------------------------------------------     Component Value Date/Time   BNP 319.6 (H) 01/30/2020 1723   BNP 540.2 (H) 08/07/2016 1433    Micro Results Recent Results (from the past 240 hour(s))  Blood Culture (routine x 2)     Status: None (Preliminary result)   Collection Time: 01/30/20  5:23 PM   Specimen: BLOOD RIGHT HAND  Result Value Ref Range Status   Specimen Description BLOOD RIGHT HAND  Final   Special Requests   Final    BOTTLES DRAWN AEROBIC ONLY Blood Culture results may not be optimal due to an inadequate volume of blood received in culture bottles   Culture   Final    NO GROWTH 4 DAYS Performed at [facility], [place] [address] [place], [place] [address]    Report Status PENDING  Incomplete  Blood Culture (routine x 2)     Status: None (Preliminary result)   Collection Time: 01/30/20  6:26 PM   Specimen: BLOOD RIGHT WRIST  Result Value Ref Range Status   Specimen Description BLOOD RIGHT WRIST  Final   Special Requests   Final    BOTTLES DRAWN AEROBIC ONLY Blood Culture results may not be optimal due to an inadequate volume of blood received in culture bottles   Culture   Final    NO GROWTH 4 DAYS Performed at [facility], [place] [address] [place], [place] [address]    Report Status PENDING  Incomplete    Radiology Reports DG Chest 2 View  Result Date: 01/19/2020 CLINICAL DATA:  Fatigue.  History of CHF. EXAM: CHEST - 2 VIEW COMPARISON:  12/05/2019.  09/30/2019. FINDINGS: AICD in stable position. Again noted cardiomegaly with bilateral interstitial prominence suggesting CHF. Small bilateral pleural effusion may be present. Calcified left lung nodule consistent with granuloma again noted. No pneumothorax. Degenerative change thoracic spine. Lower thoracic spine compression fracture noted on today's exam. IMPRESSION: 1. AICD in stable position. Cardiomegaly with bilateral interstitial prominence again noted suggesting CHF. Similar findings noted on prior exam. Small bilateral pleural effusions may be  present. 2. Lower thoracic vertebral body compression fracture may be present. Electronically Signed   By: Marcello Moores  Register   On: 01/19/2020 05:14   DG Chest 2 View  Result Date: 01/12/2020 CLINICAL DATA:  Chest pain. EXAM: CHEST - 2 VIEW COMPARISON:  12/05/2019. FINDINGS: AICD in stable position. Again noted cardiomegaly with pulmonary venous congestion bilateral interstitial prominence and small bilateral pleural effusions. Findings consistent CHF. Similar findings noted on prior exam. Calcified pulmonary nodules noted consistent prior granulomas disease. No pneumothorax. IMPRESSION: AICD in stable position. Cardiomegaly with pulmonary venous congestion, bilateral interstitial prominence, and small bilateral pleural effusions again noted. Findings again consistent with CHF. Similar findings noted on prior exam. Electronically Signed   By: Marcello Moores  Register   On: 01/12/2020 05:20   DG Wrist Complete Left  Result Date: 01/12/2020 CLINICAL DATA:  Persistent left wrist pain after fall 1 month ago EXAM: LEFT WRIST - COMPLETE 3+ VIEW COMPARISON:  12/26/2019 FINDINGS: No definite acute fracture is identified. Possible subtle horizontal lucency at the distal ulna could reflect a subacute nondisplaced fracture. No definite fracture line is seen in at the radial styloid. Mildly widened scapholunate interval of 3 mm. Similar degree of osteoarthritis, most pronounced at the first [facility]  and triscaphe joints. Bones are demineralized. Soft tissue swelling persists. IMPRESSION: 1. Possible subtle subacute nondisplaced fracture at the distal ulna. Correlate with point tenderness. 2. No definite acute or healing distal radial fracture is identified. 3. Mildly widened scapholunate interval of 3 mm suggesting underlying ligamentous injury. Electronically Signed   By: Davina Poke D.O.   On: 01/12/2020 08:47   DG Chest Port 1 View  Result Date: 01/31/2020 CLINICAL DATA:  Hypoxia.  Reported COVID-19 positive EXAM: PORTABLE  CHEST 1 VIEW COMPARISON:  January 30, 2020 and January 19, 2020 FINDINGS: Extensive patchy interstitial and airspace opacity bilaterally is essentially stable compared to 1 day prior with significant progression of disease since 2 weeks prior. Mild consolidation in the left base is stable. There is cardiomegaly with pulmonary vascularity within normal limits. Pacemaker leads attached to right atrium, right ventricle, and coronary sinus. No adenopathy appreciable. There is aortic atherosclerosis. No bone lesions. IMPRESSION: Widespread interstitial and airspace opacity bilaterally, stable compared to 1 day prior. Mild consolidation left base. No new opacity evident. Stable cardiac prominence. Stable pacemaker lead positioning. No adenopathy appreciable. Aortic Atherosclerosis (ICD10-I70.0). Electronically Signed   By: Lowella Grip III M.D.   On: 01/31/2020 11:38   DG Chest Port 1 View  Result Date: 01/30/2020 CLINICAL DATA:  COVID-19 positive EXAM: PORTABLE CHEST 1 VIEW COMPARISON:  01/26/2020 chest radiograph. FINDINGS: Stable 3 lead left subclavian ICD. Stable cardiomediastinal silhouette with moderate cardiomegaly. No pneumothorax. No pleural effusion. Extensive patchy opacities throughout both lungs are new. IMPRESSION: New extensive patchy opacities throughout both lungs, differential includes COVID-19 pneumonia and/or cardiogenic pulmonary edema given moderate cardiomegaly. Electronically Signed   By: Ilona Sorrel M.D.   On: 01/30/2020 18:07   ECHOCARDIOGRAM LIMITED  Result Date: 01/31/2020    ECHOCARDIOGRAM LIMITED REPORT   Patient Name:   DAYTONA HEDMAN Adamczak Date of Exam: 01/31/2020 Medical Rec #:  [id number]         Height:       65.0 in Accession #:    [id number]        Weight:       178.8 lb Date of Birth:  May 04, 1936         BSA:          1.886 m Patient Age:    78 years          BP:           133/63 mmHg Patient Gender: F                 HR:           59 bpm. Exam Location:  Inpatient Procedure:  Intracardiac Opacification Agent, Limited Echo, Limited Color Doppler            and Cardiac Doppler Indications:    Dyspnea 786.09 / R06.00  History:        Patient has prior history of Echocardiogram examinations, most                 recent 08/24/2019. CHF, Defibrillator, COPD, Pulmonary HTN and                 PAD, Arrythmias:LBBB; Risk Factors:Former Smoker, Hypertension                 and Dyslipidemia. Chronic hypoxemic respiratory failure                 secondary to COVID-19, sepsis, chronic kidney disease.  Sonographer:    Jonelle Sidle  [facility] Referring Phys: [place]  1. Left ventricular ejection fraction, by estimation, is 25 to 30%. The left ventricle has severely decreased function. The left ventricle demonstrates global hypokinesis. Left ventricular diastolic parameters are consistent with Grade II diastolic dysfunction (pseudonormalization). Elevated left atrial pressure.  2. Right ventricular systolic function is normal. The right ventricular size is normal. There is normal pulmonary artery systolic pressure. The estimated right ventricular systolic pressure is 18.5 mmHg.  3. The mitral valve is normal in structure. Mild mitral valve regurgitation.  4. The aortic valve was not well visualized. Aortic valve regurgitation is not visualized. Mild to moderate aortic valve sclerosis/calcification is present, without any evidence of aortic stenosis.  5. The inferior vena cava is normal in size with greater than 50% respiratory variability, suggesting right atrial pressure of 3 mmHg. FINDINGS  Left Ventricle: Left ventricular ejection fraction, by estimation, is 25 to 30%. The left ventricle has severely decreased function. The left ventricle demonstrates global hypokinesis. Definity contrast agent was given IV to delineate the left ventricular endocardial borders. Elevated left atrial pressure. Right Ventricle: The right ventricular size is normal. Right ventricular systolic function  is normal. There is normal pulmonary artery systolic pressure. The tricuspid regurgitant velocity is 2.53 m/s, and with an assumed right atrial pressure of 3 mmHg,  the estimated right ventricular systolic pressure is 63.1 mmHg. Pericardium: There is no evidence of pericardial effusion. Mitral Valve: The mitral valve is normal in structure. Mild mitral valve regurgitation. Tricuspid Valve: The tricuspid valve is normal in structure. Tricuspid valve regurgitation is trivial. Aortic Valve: The aortic valve was not well visualized. Aortic valve regurgitation is not visualized. Mild to moderate aortic valve sclerosis/calcification is present, without any evidence of aortic stenosis. Venous: The inferior vena cava is normal in size with greater than 50% respiratory variability, suggesting right atrial pressure of 3 mmHg. LEFT VENTRICLE PLAX 2D LVIDd:         5.70 cm      Diastology LVIDs:         5.00 cm      LV e' lateral:   2.47 cm/s LV PW:         0.90 cm      LV E/e' lateral: 32.1 LV IVS:        1.10 cm      LV e' medial:    2.40 cm/s LVOT diam:     1.90 cm      LV E/e' medial:  33.0 LV SV:         44 LV SV Index:   23 LVOT Area:     2.84 cm  LV Volumes (MOD) LV vol d, MOD A2C: 146.0 ml LV vol d, MOD A4C: 185.5 ml LV vol s, MOD A2C: 115.0 ml LV vol s, MOD A4C: 117.5 ml LV SV MOD A2C:     31.0 ml LV SV MOD A4C:     185.5 ml LV SV MOD BP:      47.9 ml LEFT ATRIUM         Index LA diam:    3.30 cm 1.75 cm/m  AORTIC VALVE LVOT Vmax:   67.20 cm/s LVOT Vmean:  47.900 cm/s LVOT VTI:    0.156 m MITRAL VALVE                 TRICUSPID VALVE MV Area (PHT): 3.65 cm      TR Peak grad:   25.6 mmHg MV Decel Time:  208 msec      TR Vmax:        253.00 cm/s MR Peak grad:    123.7 mmHg MR Mean grad:    64.0 mmHg   SHUNTS MR Vmax:         556.00 cm/s Systemic VTI:  0.16 m MR Vmean:        367.0 cm/s  Systemic Diam: 1.90 cm MR PISA:         1.01 cm MR PISA Eff ROA: 7 mm MR PISA Radius:  0.40 cm MV E velocity: 79.30 cm/s MV A  velocity: 91.30 cm/s MV E/A ratio:  0.87 Oswaldo Milian MD Electronically signed by Oswaldo Milian MD Signature Date/Time: 01/31/2020/3:57:07 PM    Final

## 2020-02-05 LAB — COMPREHENSIVE METABOLIC PANEL
ALT: 28 U/L (ref 0–44)
AST: 16 U/L (ref 15–41)
Albumin: 2.6 g/dL — ABNORMAL LOW (ref 3.5–5.0)
Alkaline Phosphatase: 74 U/L (ref 38–126)
Anion gap: 15 (ref 5–15)
BUN: 95 mg/dL — ABNORMAL HIGH (ref 8–23)
CO2: 26 mmol/L (ref 22–32)
Calcium: 9.8 mg/dL (ref 8.9–10.3)
Chloride: 90 mmol/L — ABNORMAL LOW (ref 98–111)
Creatinine, Ser: 2.1 mg/dL — ABNORMAL HIGH (ref 0.44–1.00)
GFR calc Af Amer: 25 mL/min — ABNORMAL LOW (ref >60)
GFR calc non Af Amer: 21 mL/min — ABNORMAL LOW (ref >60)
Glucose, Bld: 150 mg/dL — ABNORMAL HIGH (ref 70–99)
Potassium: 5.1 mmol/L (ref 3.5–5.1)
Sodium: 131 mmol/L — ABNORMAL LOW (ref 135–145)
Total Bilirubin: 0.6 mg/dL (ref 0.3–1.2)
Total Protein: 6.5 g/dL (ref 6.5–8.1)

## 2020-02-05 LAB — CBC
HCT: 49.8 % — ABNORMAL HIGH (ref 36.0–46.0)
Hemoglobin: 17 g/dL — ABNORMAL HIGH (ref 12.0–15.0)
MCH: 32 pg (ref 26.0–34.0)
MCHC: 34.1 g/dL (ref 30.0–36.0)
MCV: 93.8 fL (ref 80.0–100.0)
Platelets: 298 10*3/uL (ref 150–400)
RBC: 5.31 MIL/uL — ABNORMAL HIGH (ref 3.87–5.11)
RDW: 14.5 % (ref 11.5–15.5)
WBC: 20.8 10*3/uL — ABNORMAL HIGH (ref 4.0–10.5)
nRBC: 0 % (ref 0.0–0.2)

## 2020-02-05 LAB — SEDIMENTATION RATE: Sed Rate: 23 mm/hr — ABNORMAL HIGH (ref 0–22)

## 2020-02-05 LAB — C-REACTIVE PROTEIN: CRP: 1.4 mg/dL — ABNORMAL HIGH (ref <1.0)

## 2020-02-05 LAB — MRSA PCR SCREENING: MRSA by PCR: POSITIVE — AB

## 2020-02-05 LAB — GLUCOSE, CAPILLARY
Glucose-Capillary: 165 mg/dL — ABNORMAL HIGH (ref 70–99)
Glucose-Capillary: 139 mg/dL — ABNORMAL HIGH (ref 70–99)
Glucose-Capillary: 237 mg/dL — ABNORMAL HIGH (ref 70–99)
Glucose-Capillary: 162 mg/dL — ABNORMAL HIGH (ref 70–99)

## 2020-02-05 LAB — D-DIMER, QUANTITATIVE: D-Dimer, Quant: 2.12 ug/mL-FEU — ABNORMAL HIGH (ref 0.00–0.50)

## 2020-02-05 LAB — MAGNESIUM: Magnesium: 2.5 mg/dL — ABNORMAL HIGH (ref 1.7–2.4)

## 2020-02-05 MED ORDER — MUPIROCIN 2 % EX OINT
1.0000 "application " | TOPICAL_OINTMENT | Freq: Two times a day (BID) | CUTANEOUS | Status: AC
  Administered 2020-02-05 – 2020-02-09 (×10): 1 via NASAL
  Filled 2020-02-05 (×3): qty 22

## 2020-02-05 MED ORDER — ORAL CARE MOUTH RINSE
15.0000 mL | Freq: Two times a day (BID) | OROMUCOSAL | Status: DC
  Administered 2020-02-05 – 2020-02-10 (×10): 15 mL via OROMUCOSAL

## 2020-02-05 MED ORDER — TORSEMIDE 20 MG PO TABS
80.0000 mg | ORAL_TABLET | Freq: Every day | ORAL | Status: DC
  Administered 2020-02-06 – 2020-02-09 (×4): 80 mg via ORAL
  Filled 2020-02-05 (×5): qty 4

## 2020-02-05 MED ORDER — CHLORHEXIDINE GLUCONATE CLOTH 2 % EX PADS
6.0000 | MEDICATED_PAD | Freq: Every day | CUTANEOUS | Status: AC
  Administered 2020-02-05 – 2020-02-09 (×4): 6 via TOPICAL

## 2020-02-05 NOTE — Care Management (Signed)
Requested [facility] to assess for eligibility for LTAC as a possible disposition for weaning of high flow oxygen in the setting of COVID PNA with qualifying ICU stay this admission.

## 2020-02-05 NOTE — Progress Notes (Addendum)
PROGRESS NOTE                                                                                                                                                                                                             Patient Demographics:    Sydney Phillips, is a 84 y.o. female, DOB - 1936-02-18, TKW:[id number]  Outpatient Primary MD for the patient is Sydney Morale, MD   Admit date - 01/30/2020   LOS - 6  Chief Complaint  Patient presents with  . Altered Mental Status    covid +       Brief Narrative: Patient is a 84 y.o. female with PMHx of chronic systolic heart failure s/p CRT, ventricular tachycardia, COPD on home O2-mostly at night, HTN, DM-2, CKD stage IIIb-recently hospitalized from 8/4-8/9 for hypoxia from COVID-19-admitted by PCCM to the ICU for profound hypoxemia requiring heated high flow/NRB due to ongoing Covid pneumonitis with progressive fibrotic changes.  See below for further details.  Vaccination status: Unvaccinated  Significant Events: 7/29-8/1>> admit to [facility] by cardiology for decompensated heart failure 8/5>> COVID-19 positive 8/4-8/9>> hospitalized for hypoxia due to COVID-19 pneumonia-discharged on home O2 8/16>> readmitted to ICU for severe hypoxemia 8/20>> transfer to [facility]  Significant studies: 01/31/2020>> Echo: EF 92-42%, grade 2 diastolic dysfunction 6/83/4196>> chest x-ray: Widespread interstitial/airspace opacity bilaterally. 01/30/2020>> chest x-ray: New extensive patchy opacities throughout both lungs. 01/19/2020>>Chest x-ray: Cardiomegaly with bilateral interstitial prominence. 08/24/2019>> echo: EF 20-25%  COVID-19 medications: Remdesivir: 8/5>> 8/9 Steroids: 8/16>>  Antibiotics: Cefepime: 8/16>> Vancomycin: 8/16>> 8/18  Microbiology data: 8/16>> blood culture: No growth 8/5 >>blood culture: No growth  Procedures: None  Consults: PCCM  DVT prophylaxis: enoxaparin  (LOVENOX) injection 30 mg Start: 01/31/20 1400 SCDs Start: 01/31/20 0926    Subjective:   Unchanged-remains on heated high flow and NRB.  Appears comfortable-but frustrated today-just wants to be left alone.   Assessment  & Plan :   Acute on chronic hypoxic hypoxic Resp Failure (on home O2 2 L nocturnally) due to Covid 19 Viral pneumonia with progressive fibrotic changes (organizing pneumonia) superimposed on HCAP: Remains unchanged for the past several days-continues to have severe hypoxemia-remains on heated high flow and NRB.  No signs of improvement as of yet.  Amiodarone discontinued on 8/21.  On prednisone with plans for a long taper as recommended by PCCM.  Her overall prognosis is very poor-she is already a DNR-plans are to watch closely and hope for clinical improvement-however if in the meantime she were to deteriorate significantly-apart from transitioning to comfort measures-we really do not have any other options (patient-son (Sydney Phillips) aware)  Fever: afebrile O2 requirements:  SpO2: (!) 88 % O2 Flow Rate (L/min): 45 L/min FiO2 (%): 100 %   COVID-19 Labs: Recent Labs    02/04/20 0923 02/05/20 0404  DDIMER 1.94* 2.12*  CRP 2.8* 1.4*       Component Value Date/Time   BNP 319.6 (H) 01/30/2020 1723   BNP 540.2 (H) 08/07/2016 1433    No results for input(s): PROCALCITON in the last 168 hours.  Lab Results  Component Value Date   SARSCOV2NAA POSITIVE (A) 01/19/2020   [place] NEGATIVE 01/12/2020   [place] NEGATIVE 12/11/2019   [place] NEGATIVE 12/05/2019     Prone/Incentive Spirometry: encouraged incentive spirometry use 3-4/hour.  Chronic systolic heart failure: Volume status is stable-diuretics held on 8/21 due to bump in creatinine-resume at daily dosing.  Follow.    AKI on CKD stage IIIb: Creatinine improved-trending down-resume diuretics but at daily dosing.  Repeat electrolytes tomorrow.    CAD: No anginal symptoms-continue  aspirin/Plavix/beta-blocker follow.  Last [facility] May 2021 showed stable anatomy.  PAD: History of subclavian stenting in 2013-stable.  History of ventricular tachycardia-s/p ICD shocks prompting her last hospitalization: Continue telemetry monitoring- amiodarone discontinued due to concern that it may be exacerbating lung fibrosis.  Case was discussed with cardiologist-Dr. Haroldine Laws on 8/21.  Recommendations were to monitor on telemetry-she has an ICD in place.  Potassium/magnesium stable.  DM-2 (A1c 6.7 on 7/31) with uncontrolled hyperglycemia secondary to steroids: CBG's on the higher side-increase Lantus to 14 units, continue SSI-follow and optimize.  Recent Labs    02/04/20 2102 02/05/20 0750 02/05/20 1226  GLUCAP 386* 165* 139*    Depression/anxiety: Appears stable-continue Cymbalta and Xanax.  Nondisplaced left wrist fracture: Continue with splint-WABT through elbow.  Follow-up with Dr. Jeannie Fend in 2 weeks.  Was evaluated by orthopedics on 7/29.  Debility/deconditioning: Secondary to acute illness-evaluated by PT with recommendations for home health on discharge.  Palliative care discussion: DNR in place.  Have discussed with patient's son Sydney Phillips multiple times over the past few days-he is aware of patient's very poor overall prognosis-and the potential that she could deteriorate further.  She is already with severe hypoxia.  He is aware that if she were to deteriorate any further-apart from transitioning to hospice care we really do not have any other options.  However she has been stable-comfortable-over the past few days.  ABG:    Component Value Date/Time   PHART 7.432 12/05/2019 1343   PCO2ART 53.9 (H) 12/05/2019 1343   PO2ART 94 12/05/2019 1343   HCO3 33.0 (H) 01/30/2020 1726   TCO2 34 (H) 01/30/2020 1726   ACIDBASEDEF 6.0 (H) 04/06/2016 1242   O2SAT 51.0 01/30/2020 1726    Vent Settings: N/A FiO2 (%):  [100 %] 100 %  Condition - Stable  Family Communication  : Son-Sydney Phillips  ([phone]) updated on 8/22  Code Status :  Full Code  Diet :  Diet Order            Diet regular Room service appropriate? Yes; Fluid consistency: Thin  Diet effective now                  Disposition Plan  :   Status is: Inpatient  Remains inpatient appropriate because:Inpatient level of  care appropriate due to severity of illness  Dispo: The patient is from: Home              Anticipated d/c is to: Home              Anticipated d/c date is: 1 days-[place] 8/9              Patient currently is not medically stable to d/c.   Barriers to discharge: Hypoxia requiring O2 supplementation  Antimicorbials  :    Anti-infectives (From admission, onward)   Start     Dose/Rate Route Frequency Ordered Stop   01/31/20 2100  vancomycin (VANCOCIN) IVPB 1000 mg/200 mL premix  Status:  Discontinued        1,000 mg 200 mL/hr over 60 Minutes Intravenous Every 24 hours 01/30/20 1931 02/02/20 0928   01/31/20 2000  ceFEPIme (MAXIPIME) 2 g in sodium chloride 0.9 % 100 mL IVPB        2 g 200 mL/hr over 30 Minutes Intravenous Every 24 hours 01/30/20 1931     01/30/20 1830  vancomycin (VANCOREADY) IVPB 1500 mg/300 mL        1,500 mg 150 mL/hr over 120 Minutes Intravenous  Once 01/30/20 1823 01/30/20 2350   01/30/20 1830  ceFEPIme (MAXIPIME) 2 g in sodium chloride 0.9 % 100 mL IVPB        2 g 200 mL/hr over 30 Minutes Intravenous  Once 01/30/20 1823 01/30/20 1955      Inpatient Medications  Scheduled Meds: . allopurinol  300 mg Oral Daily  . vitamin C  250 mg Oral Daily  . aspirin  81 mg Oral Daily  . bisoprolol  5 mg Oral Daily  . Chlorhexidine Gluconate Cloth  6 each Topical Q0600  . cholecalciferol  4,000 Units Oral Daily  . clopidogrel  75 mg Oral Daily  . DULoxetine  60 mg Oral BID  . enoxaparin (LOVENOX) injection  30 mg Subcutaneous Q24H  . insulin aspart  0-15 Units Subcutaneous TID WC  . insulin glargine  14 Units Subcutaneous Daily  . Ipratropium-Albuterol  1 puff  Inhalation TID  . loratadine  10 mg Oral Daily  . magnesium oxide  400 mg Oral Daily  . mouth rinse  15 mL Mouth Rinse BID  . mupirocin ointment  1 application Nasal BID  . pantoprazole  40 mg Oral Q1200  . predniSONE  50 mg Oral Q breakfast  . vitamin B-12  2,000 mcg Oral Daily   Continuous Infusions: . sodium chloride Stopped (02/05/20 0120)  . ceFEPime (MAXIPIME) IV Stopped (02/04/20 2104)   PRN Meds:.sodium chloride, ALPRAZolam, docusate sodium, docusate sodium, ondansetron (ZOFRAN) IV, polyethylene glycol   Time Spent in minutes  25   See all Orders from today for further details   Oren Binet M.D on 02/05/2020 at 3:02 PM  To page go to [email or web address] - use universal password  [facility] -  Office  [phone]    Objective:   Vitals:   02/05/20 0805 02/05/20 0937 02/05/20 1220 02/05/20 1318  BP: (!) 152/76 (!) 152/76 (!) 141/77 (!) 141/77  Pulse: 60 60 60 (!) 59  Resp: 19 (!) 21 18 (!) 23  Temp: 98.7 F (37.1 C)  98.4 F (36.9 C)   TempSrc: Axillary  Axillary   SpO2: (!) 89% (!) 88% (!) 84% (!) 88%  Weight:      Height:        Wt Readings from Last 3 Encounters:  02/05/20  77.8 kg  01/23/20 81.1 kg  01/15/20 81.1 kg     Intake/Output Summary (Last 24 hours) at 02/05/2020 1502 Last data filed at 02/05/2020 1300 Gross per 24 hour  Intake 1096.2 ml  Output 2250 ml  Net -1153.8 ml     Physical Exam Gen Exam:Alert awake-not in any distress HEENT:atraumatic, normocephalic Chest: B/L clear to auscultation anteriorly CVS:S1S2 regular Abdomen:soft non tender, non distended Extremities:no edema Neurology: Non focal Skin: no rash   Data Review:    CBC Recent Labs  Lab 01/30/20 1723 01/30/20 1726 01/31/20 0404 01/31/20 1309 02/01/20 0447 02/04/20 0923 02/05/20 0832  WBC 23.8*   < > 12.9* 14.7* 21.5* 16.7* 20.8*  HGB 16.3*   < > 14.5 15.0 14.6 15.9* 17.0*  HCT 50.6*   < > 45.5 46.9* 44.9 48.7* 49.8*  PLT 323   < > 204 214 206 247  298  MCV 97.5   < > 98.1 97.1 96.8 94.9 93.8  MCH 31.4   < > 31.3 31.1 31.5 31.0 32.0  MCHC 32.2   < > 31.9 32.0 32.5 32.6 34.1  RDW 15.1   < > 15.3 15.2 15.1 14.6 14.5  LYMPHSABS 0.5*  --   --   --   --   --   --   MONOABS 1.4*  --   --   --   --   --   --   EOSABS 0.0  --   --   --   --   --   --   BASOSABS 0.1  --   --   --   --   --   --    < > = values in this interval not displayed.    Chemistries  Recent Labs  Lab 01/30/20 1723 01/30/20 1726 01/31/20 0404 01/31/20 1309 02/01/20 0447 02/02/20 1025 02/03/20 0945 02/04/20 0923 02/05/20 0404  NA 134*   < > 134*  --  134* 133* 131* 130* 131*  K 4.5   < > 4.4  --  4.2 4.4 4.8 4.8 5.1  CL 90*   < > 95*  --  96* 97* 90* 88* 90*  CO2 28   < > 25  --  25 25 24 26 26   GLUCOSE 147*   < > 253*  --  205* 325* 323* 336* 150*  BUN 49*   < > 50*  --  58* 65* 70* 86* 95*  CREATININE 2.05*   < > 1.96*   < > 1.86* 1.94* 1.89* 2.21* 2.10*  CALCIUM 9.4   < > 9.0  --  9.0 9.1 9.6 9.7 9.8  MG  --   --  1.9  --  1.9  --   --  2.3 2.5*  AST 41  --   --   --   --  37 27 18 16   ALT 38  --   --   --   --  33 33 31 28  ALKPHOS 85  --   --   --   --  63 72 74 74  BILITOT 1.1  --   --   --   --  0.5 0.7 0.8 0.6   < > = values in this interval not displayed.   ------------------------------------------------------------------------------------------------------------------ No results for input(s): CHOL, HDL, LDLCALC, TRIG, CHOLHDL, LDLDIRECT in the last 72 hours.  Lab Results  Component Value Date   HGBA1C 6.7 (H) 01/14/2020   ------------------------------------------------------------------------------------------------------------------ No results for input(s): TSH,  T4TOTAL, T3FREE, THYROIDAB in the last 72 hours.  Invalid input(s): FREET3 ------------------------------------------------------------------------------------------------------------------ No results for input(s): VITAMINB12, FOLATE, FERRITIN, TIBC, IRON, RETICCTPCT in the  last 72 hours.  Coagulation profile No results for input(s): INR, PROTIME in the last 168 hours.  Recent Labs    02/04/20 0923 02/05/20 0404  DDIMER 1.94* 2.12*    Cardiac Enzymes No results for input(s): CKMB, TROPONINI, MYOGLOBIN in the last 168 hours.  Invalid input(s): CK ------------------------------------------------------------------------------------------------------------------    Component Value Date/Time   BNP 319.6 (H) 01/30/2020 1723   BNP 540.2 (H) 08/07/2016 1433    Micro Results Recent Results (from the past 240 hour(s))  Blood Culture (routine x 2)     Status: None   Collection Time: 01/30/20  5:23 PM   Specimen: BLOOD RIGHT HAND  Result Value Ref Range Status   Specimen Description BLOOD RIGHT HAND  Final   Special Requests   Final    BOTTLES DRAWN AEROBIC ONLY Blood Culture results may not be optimal due to an inadequate volume of blood received in culture bottles   Culture   Final    NO GROWTH 5 DAYS Performed at [facility], [place] [address] [place], [place] [address]    Report Status 02/04/2020 FINAL  Final  Blood Culture (routine x 2)     Status: None   Collection Time: 01/30/20  6:26 PM   Specimen: BLOOD RIGHT WRIST  Result Value Ref Range Status   Specimen Description BLOOD RIGHT WRIST  Final   Special Requests   Final    BOTTLES DRAWN AEROBIC ONLY Blood Culture results may not be optimal due to an inadequate volume of blood received in culture bottles   Culture   Final    NO GROWTH 5 DAYS Performed at [facility], [place] [address], [place], [place] [address]    Report Status 02/04/2020 FINAL  Final  MRSA PCR Screening     Status: Abnormal   Collection Time: 02/05/20  5:47 AM   Specimen: Nasal Mucosa; Nasopharyngeal  Result Value Ref Range Status   MRSA by PCR POSITIVE (A) NEGATIVE Final    Comment:        The GeneXpert MRSA Assay (FDA approved for NASAL specimens only), is one component of a comprehensive MRSA  colonization surveillance program. It is not intended to diagnose MRSA infection nor to guide or monitor treatment for MRSA infections. RESULT CALLED TO, READ BACK BY AND VERIFIED WITH: LIGHTNER,N RN 02/05/2020 AT 3762 SKEEN,P Performed at [facility], [place] [address] [place],  [address]     Radiology Reports DG Chest 2 View  Result Date: 01/19/2020 CLINICAL DATA:  Fatigue.  History of CHF. EXAM: CHEST - 2 VIEW COMPARISON:  12/05/2019.  09/30/2019. FINDINGS: AICD in stable position. Again noted cardiomegaly with bilateral interstitial prominence suggesting CHF. Small bilateral pleural effusion may be present. Calcified left lung nodule consistent with granuloma again noted. No pneumothorax. Degenerative change thoracic spine. Lower thoracic spine compression fracture noted on today's exam. IMPRESSION: 1. AICD in stable position. Cardiomegaly with bilateral interstitial prominence again noted suggesting CHF. Similar findings noted on prior exam. Small bilateral pleural effusions may be present. 2. Lower thoracic vertebral body compression fracture may be present. Electronically Signed   By: Marcello Moores  Register   On: 01/19/2020 05:14   DG Chest 2 View  Result Date: 01/12/2020 CLINICAL DATA:  Chest pain. EXAM: CHEST - 2 VIEW COMPARISON:  12/05/2019. FINDINGS: AICD in stable position. Again  noted cardiomegaly with pulmonary venous congestion bilateral interstitial prominence and small bilateral pleural effusions. Findings consistent CHF. Similar findings noted on prior exam. Calcified pulmonary nodules noted consistent prior granulomas disease. No pneumothorax. IMPRESSION: AICD in stable position. Cardiomegaly with pulmonary venous congestion, bilateral interstitial prominence, and small bilateral pleural effusions again noted. Findings again consistent with CHF. Similar findings noted on prior exam. Electronically Signed   By: Marcello Moores  Register   On: 01/12/2020 05:20   DG Wrist Complete  Left  Result Date: 01/12/2020 CLINICAL DATA:  Persistent left wrist pain after fall 1 month ago EXAM: LEFT WRIST - COMPLETE 3+ VIEW COMPARISON:  12/26/2019 FINDINGS: No definite acute fracture is identified. Possible subtle horizontal lucency at the distal ulna could reflect a subacute nondisplaced fracture. No definite fracture line is seen in at the radial styloid. Mildly widened scapholunate interval of 3 mm. Similar degree of osteoarthritis, most pronounced at the first [facility] and triscaphe joints. Bones are demineralized. Soft tissue swelling persists. IMPRESSION: 1. Possible subtle subacute nondisplaced fracture at the distal ulna. Correlate with point tenderness. 2. No definite acute or healing distal radial fracture is identified. 3. Mildly widened scapholunate interval of 3 mm suggesting underlying ligamentous injury. Electronically Signed   By: Davina Poke D.O.   On: 01/12/2020 08:47   DG Chest Port 1 View  Result Date: 01/31/2020 CLINICAL DATA:  Hypoxia.  Reported COVID-19 positive EXAM: PORTABLE CHEST 1 VIEW COMPARISON:  January 30, 2020 and January 19, 2020 FINDINGS: Extensive patchy interstitial and airspace opacity bilaterally is essentially stable compared to 1 day prior with significant progression of disease since 2 weeks prior. Mild consolidation in the left base is stable. There is cardiomegaly with pulmonary vascularity within normal limits. Pacemaker leads attached to right atrium, right ventricle, and coronary sinus. No adenopathy appreciable. There is aortic atherosclerosis. No bone lesions. IMPRESSION: Widespread interstitial and airspace opacity bilaterally, stable compared to 1 day prior. Mild consolidation left base. No new opacity evident. Stable cardiac prominence. Stable pacemaker lead positioning. No adenopathy appreciable. Aortic Atherosclerosis (ICD10-I70.0). Electronically Signed   By: Lowella Grip III M.D.   On: 01/31/2020 11:38   DG Chest Port 1 View  Result Date:  01/30/2020 CLINICAL DATA:  COVID-19 positive EXAM: PORTABLE CHEST 1 VIEW COMPARISON:  01/26/2020 chest radiograph. FINDINGS: Stable 3 lead left subclavian ICD. Stable cardiomediastinal silhouette with moderate cardiomegaly. No pneumothorax. No pleural effusion. Extensive patchy opacities throughout both lungs are new. IMPRESSION: New extensive patchy opacities throughout both lungs, differential includes COVID-19 pneumonia and/or cardiogenic pulmonary edema given moderate cardiomegaly. Electronically Signed   By: Ilona Sorrel M.D.   On: 01/30/2020 18:07   ECHOCARDIOGRAM LIMITED  Result Date: 01/31/2020    ECHOCARDIOGRAM LIMITED REPORT   Patient Name:   METHA KOLASA Spooner Date of Exam: 01/31/2020 Medical Rec #:  [id number]         Height:       65.0 in Accession #:    [id number]        Weight:       178.8 lb Date of Birth:  02/21/1936         BSA:          1.886 m Patient Age:    40 years          BP:           133/63 mmHg Patient Gender: F                 HR:  59 bpm. Exam Location:  Inpatient Procedure: Intracardiac Opacification Agent, Limited Echo, Limited Color Doppler            and Cardiac Doppler Indications:    Dyspnea 786.09 / R06.00  History:        Patient has prior history of Echocardiogram examinations, most                 recent 08/24/2019. CHF, Defibrillator, COPD, Pulmonary HTN and                 PAD, Arrythmias:LBBB; Risk Factors:Former Smoker, Hypertension                 and Dyslipidemia. Chronic hypoxemic respiratory failure                 secondary to COVID-19, sepsis, chronic kidney disease.  Sonographer:    Darlina Sicilian RDCS Referring Phys: [place]  1. Left ventricular ejection fraction, by estimation, is 25 to 30%. The left ventricle has severely decreased function. The left ventricle demonstrates global hypokinesis. Left ventricular diastolic parameters are consistent with Grade II diastolic dysfunction (pseudonormalization). Elevated left atrial  pressure.  2. Right ventricular systolic function is normal. The right ventricular size is normal. There is normal pulmonary artery systolic pressure. The estimated right ventricular systolic pressure is 40.8 mmHg.  3. The mitral valve is normal in structure. Mild mitral valve regurgitation.  4. The aortic valve was not well visualized. Aortic valve regurgitation is not visualized. Mild to moderate aortic valve sclerosis/calcification is present, without any evidence of aortic stenosis.  5. The inferior vena cava is normal in size with greater than 50% respiratory variability, suggesting right atrial pressure of 3 mmHg. FINDINGS  Left Ventricle: Left ventricular ejection fraction, by estimation, is 25 to 30%. The left ventricle has severely decreased function. The left ventricle demonstrates global hypokinesis. Definity contrast agent was given IV to delineate the left ventricular endocardial borders. Elevated left atrial pressure. Right Ventricle: The right ventricular size is normal. Right ventricular systolic function is normal. There is normal pulmonary artery systolic pressure. The tricuspid regurgitant velocity is 2.53 m/s, and with an assumed right atrial pressure of 3 mmHg,  the estimated right ventricular systolic pressure is 14.4 mmHg. Pericardium: There is no evidence of pericardial effusion. Mitral Valve: The mitral valve is normal in structure. Mild mitral valve regurgitation. Tricuspid Valve: The tricuspid valve is normal in structure. Tricuspid valve regurgitation is trivial. Aortic Valve: The aortic valve was not well visualized. Aortic valve regurgitation is not visualized. Mild to moderate aortic valve sclerosis/calcification is present, without any evidence of aortic stenosis. Venous: The inferior vena cava is normal in size with greater than 50% respiratory variability, suggesting right atrial pressure of 3 mmHg. LEFT VENTRICLE PLAX 2D LVIDd:         5.70 cm      Diastology LVIDs:         5.00 cm       LV e' lateral:   2.47 cm/s LV PW:         0.90 cm      LV E/e' lateral: 32.1 LV IVS:        1.10 cm      LV e' medial:    2.40 cm/s LVOT diam:     1.90 cm      LV E/e' medial:  33.0 LV SV:         44 LV SV Index:   23  LVOT Area:     2.84 cm  LV Volumes (MOD) LV vol d, MOD A2C: 146.0 ml LV vol d, MOD A4C: 185.5 ml LV vol s, MOD A2C: 115.0 ml LV vol s, MOD A4C: 117.5 ml LV SV MOD A2C:     31.0 ml LV SV MOD A4C:     185.5 ml LV SV MOD BP:      47.9 ml LEFT ATRIUM         Index LA diam:    3.30 cm 1.75 cm/m  AORTIC VALVE LVOT Vmax:   67.20 cm/s LVOT Vmean:  47.900 cm/s LVOT VTI:    0.156 m MITRAL VALVE                 TRICUSPID VALVE MV Area (PHT): 3.65 cm      TR Peak grad:   25.6 mmHg MV Decel Time: 208 msec      TR Vmax:        253.00 cm/s MR Peak grad:    123.7 mmHg MR Mean grad:    64.0 mmHg   SHUNTS MR Vmax:         556.00 cm/s Systemic VTI:  0.16 m MR Vmean:        367.0 cm/s  Systemic Diam: 1.90 cm MR PISA:         1.01 cm MR PISA Eff ROA: 7 mm MR PISA Radius:  0.40 cm MV E velocity: 79.30 cm/s MV A velocity: 91.30 cm/s MV E/A ratio:  0.87 Oswaldo Milian MD Electronically signed by Oswaldo Milian MD Signature Date/Time: 01/31/2020/3:57:07 PM    Final

## 2020-02-06 LAB — COMPREHENSIVE METABOLIC PANEL
ALT: 24 U/L (ref 0–44)
AST: 16 U/L (ref 15–41)
Albumin: 2.6 g/dL — ABNORMAL LOW (ref 3.5–5.0)
Alkaline Phosphatase: 76 U/L (ref 38–126)
Anion gap: 13 (ref 5–15)
BUN: 101 mg/dL — ABNORMAL HIGH (ref 8–23)
CO2: 28 mmol/L (ref 22–32)
Calcium: 9.5 mg/dL (ref 8.9–10.3)
Chloride: 92 mmol/L — ABNORMAL LOW (ref 98–111)
Creatinine, Ser: 2.07 mg/dL — ABNORMAL HIGH (ref 0.44–1.00)
GFR calc Af Amer: 25 mL/min — ABNORMAL LOW (ref >60)
GFR calc non Af Amer: 22 mL/min — ABNORMAL LOW (ref >60)
Glucose, Bld: 160 mg/dL — ABNORMAL HIGH (ref 70–99)
Potassium: 5.3 mmol/L — ABNORMAL HIGH (ref 3.5–5.1)
Sodium: 133 mmol/L — ABNORMAL LOW (ref 135–145)
Total Bilirubin: 0.8 mg/dL (ref 0.3–1.2)
Total Protein: 6.8 g/dL (ref 6.5–8.1)

## 2020-02-06 LAB — CBC
HCT: 50.5 % — ABNORMAL HIGH (ref 36.0–46.0)
Hemoglobin: 16.7 g/dL — ABNORMAL HIGH (ref 12.0–15.0)
MCH: 30.8 pg (ref 26.0–34.0)
MCHC: 33.1 g/dL (ref 30.0–36.0)
MCV: 93 fL (ref 80.0–100.0)
Platelets: 280 10*3/uL (ref 150–400)
RBC: 5.43 MIL/uL — ABNORMAL HIGH (ref 3.87–5.11)
RDW: 14.5 % (ref 11.5–15.5)
WBC: 16.6 10*3/uL — ABNORMAL HIGH (ref 4.0–10.5)
nRBC: 0 % (ref 0.0–0.2)

## 2020-02-06 LAB — MAGNESIUM: Magnesium: 3 mg/dL — ABNORMAL HIGH (ref 1.7–2.4)

## 2020-02-06 LAB — TROPONIN I (HIGH SENSITIVITY)
Troponin I (High Sensitivity): 30 ng/L — ABNORMAL HIGH (ref <18)
Troponin I (High Sensitivity): 30 ng/L — ABNORMAL HIGH (ref <18)

## 2020-02-06 LAB — GLUCOSE, CAPILLARY
Glucose-Capillary: 200 mg/dL — ABNORMAL HIGH (ref 70–99)
Glucose-Capillary: 305 mg/dL — ABNORMAL HIGH (ref 70–99)
Glucose-Capillary: 259 mg/dL — ABNORMAL HIGH (ref 70–99)
Glucose-Capillary: 227 mg/dL — ABNORMAL HIGH (ref 70–99)

## 2020-02-06 LAB — D-DIMER, QUANTITATIVE: D-Dimer, Quant: 1.59 ug/mL-FEU — ABNORMAL HIGH (ref 0.00–0.50)

## 2020-02-06 LAB — C-REACTIVE PROTEIN: CRP: 1.1 mg/dL — ABNORMAL HIGH (ref <1.0)

## 2020-02-06 MED ORDER — PREDNISONE 10 MG PO TABS: 10.0000 mg | ORAL_TABLET | Freq: Every day | ORAL | Status: DC

## 2020-02-06 MED ORDER — PREDNISONE 20 MG PO TABS: 30.0000 mg | ORAL_TABLET | Freq: Every day | ORAL | Status: DC

## 2020-02-06 MED ORDER — PREDNISONE 20 MG PO TABS: 40.0000 mg | ORAL_TABLET | Freq: Every day | ORAL | Status: DC

## 2020-02-06 MED ORDER — PREDNISONE 20 MG PO TABS: 20.0000 mg | ORAL_TABLET | Freq: Every day | ORAL | Status: DC

## 2020-02-06 MED ORDER — PREDNISONE 20 MG PO TABS
50.0000 mg | ORAL_TABLET | Freq: Every day | ORAL | Status: DC
  Administered 2020-02-07 – 2020-02-10 (×4): 50 mg via ORAL
  Filled 2020-02-06 (×4): qty 2

## 2020-02-06 MED ORDER — MORPHINE SULFATE (PF) 2 MG/ML IV SOLN
1.0000 mg | INTRAVENOUS | Status: DC | PRN
  Administered 2020-02-06: 1 mg via INTRAVENOUS
  Filled 2020-02-06 (×4): qty 1

## 2020-02-06 MED ORDER — INSULIN GLARGINE 100 UNIT/ML ~~LOC~~ SOLN
18.0000 [IU] | Freq: Every day | SUBCUTANEOUS | Status: DC
  Administered 2020-02-07 – 2020-02-10 (×4): 18 [IU] via SUBCUTANEOUS
  Filled 2020-02-06 (×4): qty 0.18

## 2020-02-06 MED ORDER — NITROGLYCERIN 0.4 MG SL SUBL
0.4000 mg | SUBLINGUAL_TABLET | SUBLINGUAL | Status: DC | PRN
  Administered 2020-02-06 (×2): 0.4 mg via SUBLINGUAL
  Filled 2020-02-06: qty 1

## 2020-02-06 MED ORDER — PREDNISONE 5 MG PO TABS: 5.0000 mg | ORAL_TABLET | Freq: Every day | ORAL | Status: DC

## 2020-02-06 NOTE — Progress Notes (Signed)
Started having retrosternal chest pain-"like someone sitting on my chest" Very hard of hearing-but seems to be unresponsive to nitroglycerin x2 and IV morphine.  Subsequently given Xanax x1 for anxiety.  EKG paced rhythm.  Troponins pending.  Spoke with Dr. Croitoru-cardiology on-call-he knows the patient well-recommends IV nitroglycerin if blood pressure can tolerate-unfortunately BP has decreased to low 90s.  Unfortunately given her situation with severe hypoxemia requiring heated high flow-Per cardiology-patient is not a candidate for any aggressive care including any sort of intervention.    We will try to manage as much as we can-but if she were to have an acute event-apart from medical management there really is no other intervention.  If she would worsen from this acute event-apart from transitioning to comfort care really no other options.  She remains a DNR.  I have spoken with patient's son Sydney Phillips over the phone this evening-and have apprised him of the above situation.  We will ask the nursing staff to monitor closely-if patient deteriorates significantly-then will need to transition to comfort care.  Patient's son request that if she is transition to comfort care-that he be allowed visitation.  I will have the nursing staff at night get in touch with him if patient patient deteriorates significantly enough to be transitioned to comfort care.

## 2020-02-06 NOTE — Progress Notes (Signed)
Pharmacy Antibiotic Note  Sydney Phillips is a 84 y.o. female admitted on 01/30/2020 with sepsis.    WBC elevated on steroids. Afebrile. CRP down. Renal function stable at ClCr ~20 ml/min. S/p 7 days of cefepime- per discussion with MD, ok to stop cefepime.   Plan: Stop cefepime 2gm IV Q24H   Height: 5\' 5"  (165.1 cm) Weight: 77.8 kg (171 lb 8.3 oz) IBW/kg (Calculated) : 57  Temp (24hrs), Avg:98 F (36.7 C), Min:97.6 F (36.4 C), Max:98.4 F (36.9 C)  Recent Labs  Lab 01/30/20 1723 01/30/20 1945 01/31/20 0234 01/31/20 0352 01/31/20 0404 01/31/20 1309 01/31/20 1309 02/01/20 0447 02/01/20 0447 02/02/20 1025 02/03/20 0945 02/04/20 0923 02/05/20 0404 02/05/20 0832 02/06/20 0419  WBC 23.8*  --   --   --    < > 14.7*  --  21.5*  --   --   --  16.7*  --  20.8* 16.6*  CREATININE 2.05*  --   --   --    < > 1.98*   < > 1.86*   < > 1.94* 1.89* 2.21* 2.10*  --  2.07*  LATICACIDVEN 3.0* 2.0* 1.7 1.6  --   --   --   --   --   --   --   --   --   --   --    < > = values in this interval not displayed.    Estimated Creatinine Clearance: 21.6 mL/min (A) (by C-G formula based on SCr of 2.07 mg/dL (H)).    Allergies  Allergen Reactions  . Potassium-Containing Compounds Other (See Comments)    Causes severe constipation  . Neomycin-Polymyxin B Gu     Swollen throat   . Other Other (See Comments)    "mycin" antibiotics cause sleeplessness and itching  . Azithromycin Rash  . Codeine Itching  . Darvon Itching  . Erythromycin Rash  . Meloxicam Other (See Comments)    Unknown    . Norco [Hydrocodone-Acetaminophen] Itching  . Penicillins Rash and Other (See Comments)    Has patient had a PCN reaction causing immediate rash, facial/tongue/throat swelling, SOB or lightheadedness with hypotension: unknown Has patient had a PCN reaction causing severe rash involving mucus membranes or skin necrosis: no Has patient had a PCN reaction that required hospitalization: unknown Has patient  had a PCN reaction occurring within the last 10 years: no If all of the above answers are "NO", then may proceed with Cephalosporin use.   Marland Kitchen Propoxyphene N-Acetaminophen Itching  . Rofecoxib Other (See Comments)    Unknown    . Rosuvastatin Other (See Comments)    cramps  . Statins Itching and Other (See Comments)    Sleeplessness Sleeplessness  . Sulfa Antibiotics Rash    Antimicrobials this admission: Vanc 8/16>>8/18 Cefepime 8/16>>8/23  Microbiology results: 8/16 blood>>ngtd 8/22 MRSA +   Benetta Spar, PharmD, BCPS, [facility] Clinical Pharmacist  Please check AMION for all [facility] phone numbers After 10:00 PM, call [place] [phone]

## 2020-02-06 NOTE — Plan of Care (Signed)
  Problem: Clinical Measurements: Goal: Ability to maintain clinical measurements within normal limits will improve Outcome: Progressing Goal: Will remain free from infection Outcome: Progressing Goal: Diagnostic test results will improve Outcome: Progressing Goal: Cardiovascular complication will be avoided Outcome: Progressing   Problem: Nutrition: Goal: Adequate nutrition will be maintained Outcome: Progressing   Problem: Elimination: Goal: Will not experience complications related to bowel motility Outcome: Progressing Goal: Will not experience complications related to urinary retention Outcome: Progressing   Problem: Pain Managment: Goal: General experience of comfort will improve Outcome: Progressing   Problem: Safety: Goal: Ability to remain free from injury will improve Outcome: Progressing   Problem: Skin Integrity: Goal: Risk for impaired skin integrity will decrease Outcome: Progressing   Problem: Respiratory: Goal: Will maintain a patent airway Outcome: Progressing Goal: Complications related to the disease process, condition or treatment will be avoided or minimized Outcome: Progressing   Problem: Education: Goal: Ability to demonstrate management of disease process will improve Outcome: Progressing Goal: Ability to verbalize understanding of medication therapies will improve Outcome: Progressing Goal: Individualized Educational Video(s) Outcome: Progressing   Problem: Cardiac: Goal: Ability to achieve and maintain adequate cardiopulmonary perfusion will improve Outcome: Progressing   Problem: Education: Goal: Knowledge of General Education information will improve Description: Including pain rating scale, medication(s)/side effects and non-pharmacologic comfort measures Outcome: Not Progressing   Problem: Health Behavior/Discharge Planning: Goal: Ability to manage health-related needs will improve Outcome: Not Progressing   Problem: Clinical  Measurements: Goal: Respiratory complications will improve Outcome: Not Progressing   Problem: Activity: Goal: Risk for activity intolerance will decrease Outcome: Not Progressing   Problem: Coping: Goal: Level of anxiety will decrease Outcome: Not Progressing   Problem: Education: Goal: Knowledge of risk factors and measures for prevention of condition will improve Outcome: Not Progressing   Problem: Coping: Goal: Psychosocial and spiritual needs will be supported Outcome: Not Progressing   Problem: Activity: Goal: Capacity to carry out activities will improve Outcome: Not Progressing

## 2020-02-06 NOTE — Progress Notes (Signed)
PROGRESS NOTE                                                                                                                                                                                                             Patient Demographics:    Sydney Phillips, is a 84 y.o. female, DOB - 07/03/1935, HYQ:[id number]  Outpatient Primary MD for the patient is Laurey Morale, MD   Admit date - 01/30/2020   LOS - 7  Chief Complaint  Patient presents with  . Altered Mental Status    covid +       Brief Narrative: Patient is a 84 y.o. female with PMHx of chronic systolic heart failure s/p CRT, ventricular tachycardia, COPD on home O2-mostly at night, HTN, DM-2, CKD stage IIIb-recently hospitalized from 8/4-8/9 for hypoxia from COVID-19-admitted by PCCM to the ICU for profound hypoxemia requiring heated high flow/NRB due to ongoing Covid pneumonitis with progressive fibrotic changes.  See below for further details.  Vaccination status: Unvaccinated  Significant Events: 7/29-8/1>> admit to [facility] by cardiology for decompensated heart failure 8/5>> COVID-19 positive 8/4-8/9>> hospitalized for hypoxia due to COVID-19 pneumonia-discharged on home O2 8/16>> readmitted to ICU for severe hypoxemia 8/20>> transfer to [facility]  Significant studies: 01/31/2020>> Echo: EF 95-28%, grade 2 diastolic dysfunction 09/27/2438>> chest x-ray: Widespread interstitial/airspace opacity bilaterally. 01/30/2020>> chest x-ray: New extensive patchy opacities throughout both lungs. 01/19/2020>>Chest x-ray: Cardiomegaly with bilateral interstitial prominence. 08/24/2019>> echo: EF 20-25%  COVID-19 medications: Remdesivir: 8/5>> 8/9 Steroids: 8/16>>  Antibiotics: Cefepime: 8/16>> 8/22 Vancomycin: 8/16>> 8/18  Microbiology data: 8/16>> blood culture: No growth 8/5 >>blood culture: No growth  Procedures: None  Consults: PCCM  DVT prophylaxis: enoxaparin  (LOVENOX) injection 30 mg Start: 01/31/20 1400 SCDs Start: 01/31/20 0926    Subjective:   Comfortable-spirits seem better today.  Tolerating only heated high flow-no longer requiring NRB.   Assessment  & Plan :   Acute on chronic hypoxic hypoxic Resp Failure (on home O2 2 L nocturnally) due to Covid 19 Viral pneumonia with progressive fibrotic changes (organizing pneumonia) superimposed on HCAP: Some mild improvement in hypoxemia today-continue heated high flow.  Continue to improve slowly titrate down FiO2.   Amiodarone discontinued on 8/21.  On prednisone with plans for a long taper as recommended by PCCM.  Her overall prognosis is very poor-she is already a  DNR-plans are to watch closely and hope for clinical improvement-however if in the meantime she were to deteriorate significantly-apart from transitioning to comfort measures-we really do not have any other options (patient-son ([name]) aware)  Fever: afebrile O2 requirements:  SpO2: 98 % O2 Flow Rate (L/min): 35 L/min FiO2 (%): 80 %   COVID-19 Labs: Recent Labs    02/04/20 0923 02/05/20 0404 02/06/20 0419  DDIMER 1.94* 2.12* 1.59*  CRP 2.8* 1.4* 1.1*       Component Value Date/Time   BNP 319.6 (H) 01/30/2020 1723   BNP 540.2 (H) 08/07/2016 1433    No results for input(s): PROCALCITON in the last 168 hours.  Lab Results  Component Value Date   SARSCOV2NAA POSITIVE (A) 01/19/2020   [place] NEGATIVE 01/12/2020   [place] NEGATIVE 12/11/2019   [place] NEGATIVE 12/05/2019     Prone/Incentive Spirometry: encouraged incentive spirometry use 3-4/hour.  Chronic systolic heart failure: Volume status is stable-diuretics held on 8/21 due to bump in creatinine-resume at daily dosing.  Follow.    AKI on CKD stage IIIb: Creatinine improved-trending down-resume diuretics but at daily dosing.  Repeat electrolytes tomorrow.    CAD: No anginal symptoms-continue aspirin/Plavix/beta-blocker follow.  Last [facility] May 2021  showed stable anatomy.  PAD: History of subclavian stenting in 2013-stable.  History of ventricular tachycardia-s/p ICD shocks prompting her last hospitalization: Continue telemetry monitoring- amiodarone discontinued due to concern that it may be exacerbating lung fibrosis.  Case was discussed with cardiologist-Dr. Haroldine Laws on 8/21.  Recommendations were to monitor on telemetry-she has an ICD in place.  Potassium/magnesium stable.  DM-2 (A1c 6.7 on 7/31) with uncontrolled hyperglycemia secondary to steroids: CBGs remain on the higher side-increase Lantus to 18 units-continue SSI and follow closely.  Recent Labs    02/05/20 2126 02/06/20 0740 02/06/20 1205  GLUCAP 162* 200* 305*    Depression/anxiety: Appears stable-continue Cymbalta and Xanax.  Nondisplaced left wrist fracture: Continue with splint-WABT through elbow.  Follow-up with Dr. Jeannie Fend in 2 weeks.  Was evaluated by orthopedics on 7/29.  Debility/deconditioning: Secondary to acute illness-evaluated by PT with recommendations for home health on discharge.  Palliative care discussion: DNR in place.  Have discussed with patient's son [name] multiple times over the past few days-he is aware of patient's very poor overall prognosis-and the potential that she could deteriorate further.  She is already with severe hypoxia.  He is aware that if she were to deteriorate any further-apart from transitioning to hospice care we really do not have any other options.  However she has been stable-comfortable-over the past few days.  ABG:    Component Value Date/Time   PHART 7.432 12/05/2019 1343   PCO2ART 53.9 (H) 12/05/2019 1343   PO2ART 94 12/05/2019 1343   HCO3 33.0 (H) 01/30/2020 1726   TCO2 34 (H) 01/30/2020 1726   ACIDBASEDEF 6.0 (H) 04/06/2016 1242   O2SAT 51.0 01/30/2020 1726    Vent Settings: N/A FiO2 (%):  [80 %-100 %] 80 %  Condition - Stable  Family Communication  : Son-[name] ([phone]) updated on 8/23  Code Status :   Full Code  Diet :  Diet Order            Diet regular Room service appropriate? Yes; Fluid consistency: Thin  Diet effective now                  Disposition Plan  :   Status is: Inpatient  Remains inpatient appropriate because:Inpatient level of care appropriate due to  severity of illness  Dispo: The patient is from: Home              Anticipated d/c is to: Home              Anticipated d/c date is: 1 days-[place] 8/9              Patient currently is not medically stable to d/c.   Barriers to discharge: Hypoxia requiring O2 supplementation  Antimicorbials  :    Anti-infectives (From admission, onward)   Start     Dose/Rate Route Frequency Ordered Stop   01/31/20 2100  vancomycin (VANCOCIN) IVPB 1000 mg/200 mL premix  Status:  Discontinued        1,000 mg 200 mL/hr over 60 Minutes Intravenous Every 24 hours 01/30/20 1931 02/02/20 0928   01/31/20 2000  ceFEPIme (MAXIPIME) 2 g in sodium chloride 0.9 % 100 mL IVPB  Status:  Discontinued        2 g 200 mL/hr over 30 Minutes Intravenous Every 24 hours 01/30/20 1931 02/06/20 1224   01/30/20 1830  vancomycin (VANCOREADY) IVPB 1500 mg/300 mL        1,500 mg 150 mL/hr over 120 Minutes Intravenous  Once 01/30/20 1823 01/30/20 2350   01/30/20 1830  ceFEPIme (MAXIPIME) 2 g in sodium chloride 0.9 % 100 mL IVPB        2 g 200 mL/hr over 30 Minutes Intravenous  Once 01/30/20 1823 01/30/20 1955      Inpatient Medications  Scheduled Meds: . allopurinol  300 mg Oral Daily  . vitamin C  250 mg Oral Daily  . aspirin  81 mg Oral Daily  . bisoprolol  5 mg Oral Daily  . Chlorhexidine Gluconate Cloth  6 each Topical Q0600  . cholecalciferol  4,000 Units Oral Daily  . clopidogrel  75 mg Oral Daily  . DULoxetine  60 mg Oral BID  . enoxaparin (LOVENOX) injection  30 mg Subcutaneous Q24H  . insulin aspart  0-15 Units Subcutaneous TID WC  . insulin glargine  14 Units Subcutaneous Daily  . Ipratropium-Albuterol  1 puff Inhalation TID  .  loratadine  10 mg Oral Daily  . magnesium oxide  400 mg Oral Daily  . mouth rinse  15 mL Mouth Rinse BID  . mupirocin ointment  1 application Nasal BID  . pantoprazole  40 mg Oral Q1200  . [START ON 02/07/2020] predniSONE  50 mg Oral Q breakfast   Followed by  . [START ON 02/12/2020] predniSONE  40 mg Oral Q breakfast   Followed by  . [START ON 02/19/2020] predniSONE  30 mg Oral Q breakfast   Followed by  . [START ON 02/26/2020] predniSONE  20 mg Oral Q breakfast   Followed by  . [START ON 03/04/2020] predniSONE  10 mg Oral Q breakfast   Followed by  . [START ON 03/11/2020] predniSONE  5 mg Oral Q breakfast  . torsemide  80 mg Oral Daily  . vitamin B-12  2,000 mcg Oral Daily   Continuous Infusions: . sodium chloride Stopped (02/05/20 0120)   PRN Meds:.sodium chloride, ALPRAZolam, docusate sodium, docusate sodium, ondansetron (ZOFRAN) IV, polyethylene glycol   Time Spent in minutes  25   See all Orders from today for further details   Oren Binet M.D on 02/06/2020 at 2:29 PM  To page go to [email or web address] - use universal password  [facility] -  Office  [phone]    Objective:   Vitals:   02/06/20  0400 02/06/20 0745 02/06/20 1126 02/06/20 1208  BP: 139/69 (!) 153/70  (!) 152/66  Pulse: 60 60 60 61  Resp: 19 19 (!) 23 19  Temp: 97.8 F (36.6 C) 97.6 F (36.4 C)  97.9 F (36.6 C)  TempSrc: Axillary Axillary  Axillary  SpO2: 92% 91% (!) 89% 98%  Weight:      Height:        Wt Readings from Last 3 Encounters:  02/05/20 77.8 kg  01/23/20 81.1 kg  01/15/20 81.1 kg     Intake/Output Summary (Last 24 hours) at 02/06/2020 1429 Last data filed at 02/06/2020 0552 Gross per 24 hour  Intake 2240.28 ml  Output 700 ml  Net 1540.28 ml     Physical Exam Gen Exam:Alert awake-not in any distress HEENT:atraumatic, normocephalic Chest: B/L clear to auscultation anteriorly CVS:S1S2 regular Abdomen:soft non tender, non distended Extremities:no edema Neurology:  Non focal Skin: no rash   Data Review:    CBC Recent Labs  Lab 01/30/20 1723 01/30/20 1726 01/31/20 1309 02/01/20 0447 02/04/20 0923 02/05/20 0832 02/06/20 0419  WBC 23.8*   < > 14.7* 21.5* 16.7* 20.8* 16.6*  HGB 16.3*   < > 15.0 14.6 15.9* 17.0* 16.7*  HCT 50.6*   < > 46.9* 44.9 48.7* 49.8* 50.5*  PLT 323   < > 214 206 247 298 280  MCV 97.5   < > 97.1 96.8 94.9 93.8 93.0  MCH 31.4   < > 31.1 31.5 31.0 32.0 30.8  MCHC 32.2   < > 32.0 32.5 32.6 34.1 33.1  RDW 15.1   < > 15.2 15.1 14.6 14.5 14.5  LYMPHSABS 0.5*  --   --   --   --   --   --   MONOABS 1.4*  --   --   --   --   --   --   EOSABS 0.0  --   --   --   --   --   --   BASOSABS 0.1  --   --   --   --   --   --    < > = values in this interval not displayed.    Chemistries  Recent Labs  Lab 01/31/20 0404 01/31/20 1309 02/01/20 0447 02/01/20 0447 02/02/20 1025 02/03/20 0945 02/04/20 0923 02/05/20 0404 02/06/20 0419  NA 134*  --  134*   < > 133* 131* 130* 131* 133*  K 4.4  --  4.2   < > 4.4 4.8 4.8 5.1 5.3*  CL 95*  --  96*   < > 97* 90* 88* 90* 92*  CO2 25  --  25   < > 25 24 26 26 28   GLUCOSE 253*  --  205*   < > 325* 323* 336* 150* 160*  BUN 50*  --  58*   < > 65* 70* 86* 95* 101*  CREATININE 1.96*   < > 1.86*   < > 1.94* 1.89* 2.21* 2.10* 2.07*  CALCIUM 9.0  --  9.0   < > 9.1 9.6 9.7 9.8 9.5  MG 1.9  --  1.9  --   --   --  2.3 2.5* 3.0*  AST  --   --   --   --  37 27 18 16 16   ALT  --   --   --   --  33 33 31 28 24   ALKPHOS  --   --   --   --  63 72 74 74 76  BILITOT  --   --   --   --  0.5 0.7 0.8 0.6 0.8   < > = values in this interval not displayed.   ------------------------------------------------------------------------------------------------------------------ No results for input(s): CHOL, HDL, LDLCALC, TRIG, CHOLHDL, LDLDIRECT in the last 72 hours.  Lab Results  Component Value Date   HGBA1C 6.7 (H) 01/14/2020    ------------------------------------------------------------------------------------------------------------------ No results for input(s): TSH, T4TOTAL, T3FREE, THYROIDAB in the last 72 hours.  Invalid input(s): FREET3 ------------------------------------------------------------------------------------------------------------------ No results for input(s): VITAMINB12, FOLATE, FERRITIN, TIBC, IRON, RETICCTPCT in the last 72 hours.  Coagulation profile No results for input(s): INR, PROTIME in the last 168 hours.  Recent Labs    02/05/20 0404 02/06/20 0419  DDIMER 2.12* 1.59*    Cardiac Enzymes No results for input(s): CKMB, TROPONINI, MYOGLOBIN in the last 168 hours.  Invalid input(s): CK ------------------------------------------------------------------------------------------------------------------    Component Value Date/Time   BNP 319.6 (H) 01/30/2020 1723   BNP 540.2 (H) 08/07/2016 1433    Micro Results Recent Results (from the past 240 hour(s))  Blood Culture (routine x 2)     Status: None   Collection Time: 01/30/20  5:23 PM   Specimen: BLOOD RIGHT HAND  Result Value Ref Range Status   Specimen Description BLOOD RIGHT HAND  Final   Special Requests   Final    BOTTLES DRAWN AEROBIC ONLY Blood Culture results may not be optimal due to an inadequate volume of blood received in culture bottles   Culture   Final    NO GROWTH 5 DAYS Performed at [facility], [place] [address], [place], [place] [address]    Report Status 02/04/2020 FINAL  Final  Blood Culture (routine x 2)     Status: None   Collection Time: 01/30/20  6:26 PM   Specimen: BLOOD RIGHT WRIST  Result Value Ref Range Status   Specimen Description BLOOD RIGHT WRIST  Final   Special Requests   Final    BOTTLES DRAWN AEROBIC ONLY Blood Culture results may not be optimal due to an inadequate volume of blood received in culture bottles   Culture   Final    NO GROWTH 5 DAYS Performed at [facility], [place] [address], [place], [place] [address]    Report Status 02/04/2020 FINAL  Final  MRSA PCR Screening     Status: Abnormal   Collection Time: 02/05/20  5:47 AM   Specimen: Nasal Mucosa; Nasopharyngeal  Result Value Ref Range Status   MRSA by PCR POSITIVE (A) NEGATIVE Final    Comment:        The GeneXpert MRSA Assay (FDA approved for NASAL specimens only), is one component of a comprehensive MRSA colonization surveillance program. It is not intended to diagnose MRSA infection nor to guide or monitor treatment for MRSA infections. RESULT CALLED TO, READ BACK BY AND VERIFIED WITH: LIGHTNER,N RN 02/05/2020 AT 3976 SKEEN,P Performed at [facility], [place] [address], [place], [place] [address]     Radiology Reports DG Chest 2 View  Result Date: 01/19/2020 CLINICAL DATA:  Fatigue.  History of CHF. EXAM: CHEST - 2 VIEW COMPARISON:  12/05/2019.  09/30/2019. FINDINGS: AICD in stable position. Again noted cardiomegaly with bilateral interstitial prominence suggesting CHF. Small bilateral pleural effusion may be present. Calcified left lung nodule consistent with granuloma again noted. No pneumothorax. Degenerative change thoracic spine. Lower thoracic spine compression fracture noted on today's exam. IMPRESSION: 1. AICD in stable position.  Cardiomegaly with bilateral interstitial prominence again noted suggesting CHF. Similar findings noted on prior exam. Small bilateral pleural effusions may be present. 2. Lower thoracic vertebral body compression fracture may be present. Electronically Signed   By: Marcello Moores  Register   On: 01/19/2020 05:14   DG Chest 2 View  Result Date: 01/12/2020 CLINICAL DATA:  Chest pain. EXAM: CHEST - 2 VIEW COMPARISON:  12/05/2019. FINDINGS: AICD in stable position. Again noted cardiomegaly with pulmonary venous congestion bilateral interstitial prominence and small bilateral pleural effusions. Findings consistent CHF. Similar findings noted on prior exam.  Calcified pulmonary nodules noted consistent prior granulomas disease. No pneumothorax. IMPRESSION: AICD in stable position. Cardiomegaly with pulmonary venous congestion, bilateral interstitial prominence, and small bilateral pleural effusions again noted. Findings again consistent with CHF. Similar findings noted on prior exam. Electronically Signed   By: Marcello Moores  Register   On: 01/12/2020 05:20   DG Wrist Complete Left  Result Date: 01/12/2020 CLINICAL DATA:  Persistent left wrist pain after fall 1 month ago EXAM: LEFT WRIST - COMPLETE 3+ VIEW COMPARISON:  12/26/2019 FINDINGS: No definite acute fracture is identified. Possible subtle horizontal lucency at the distal ulna could reflect a subacute nondisplaced fracture. No definite fracture line is seen in at the radial styloid. Mildly widened scapholunate interval of 3 mm. Similar degree of osteoarthritis, most pronounced at the first [facility] and triscaphe joints. Bones are demineralized. Soft tissue swelling persists. IMPRESSION: 1. Possible subtle subacute nondisplaced fracture at the distal ulna. Correlate with point tenderness. 2. No definite acute or healing distal radial fracture is identified. 3. Mildly widened scapholunate interval of 3 mm suggesting underlying ligamentous injury. Electronically Signed   By: Davina Poke D.O.   On: 01/12/2020 08:47   DG Chest Port 1 View  Result Date: 01/31/2020 CLINICAL DATA:  Hypoxia.  Reported COVID-19 positive EXAM: PORTABLE CHEST 1 VIEW COMPARISON:  January 30, 2020 and January 19, 2020 FINDINGS: Extensive patchy interstitial and airspace opacity bilaterally is essentially stable compared to 1 day prior with significant progression of disease since 2 weeks prior. Mild consolidation in the left base is stable. There is cardiomegaly with pulmonary vascularity within normal limits. Pacemaker leads attached to right atrium, right ventricle, and coronary sinus. No adenopathy appreciable. There is aortic  atherosclerosis. No bone lesions. IMPRESSION: Widespread interstitial and airspace opacity bilaterally, stable compared to 1 day prior. Mild consolidation left base. No new opacity evident. Stable cardiac prominence. Stable pacemaker lead positioning. No adenopathy appreciable. Aortic Atherosclerosis (ICD10-I70.0). Electronically Signed   By: Lowella Grip III M.D.   On: 01/31/2020 11:38   DG Chest Port 1 View  Result Date: 01/30/2020 CLINICAL DATA:  COVID-19 positive EXAM: PORTABLE CHEST 1 VIEW COMPARISON:  01/26/2020 chest radiograph. FINDINGS: Stable 3 lead left subclavian ICD. Stable cardiomediastinal silhouette with moderate cardiomegaly. No pneumothorax. No pleural effusion. Extensive patchy opacities throughout both lungs are new. IMPRESSION: New extensive patchy opacities throughout both lungs, differential includes COVID-19 pneumonia and/or cardiogenic pulmonary edema given moderate cardiomegaly. Electronically Signed   By: Ilona Sorrel M.D.   On: 01/30/2020 18:07   ECHOCARDIOGRAM LIMITED  Result Date: 01/31/2020    ECHOCARDIOGRAM LIMITED REPORT   Patient Name:   Sydney Phillips Date of Exam: 01/31/2020 Medical Rec #:  [id number]         Height:       65.0 in Accession #:    [id number]        Weight:       178.8 lb Date of Birth:  05-15-1936         BSA:          1.886 m Patient Age:    65 years          BP:           133/63 mmHg Patient Gender: F                 HR:           59 bpm. Exam Location:  Inpatient Procedure: Intracardiac Opacification Agent, Limited Echo, Limited Color Doppler            and Cardiac Doppler Indications:    Dyspnea 786.09 / R06.00  History:        Patient has prior history of Echocardiogram examinations, most                 recent 08/24/2019. CHF, Defibrillator, COPD, Pulmonary HTN and                 PAD, Arrythmias:LBBB; Risk Factors:Former Smoker, Hypertension                 and Dyslipidemia. Chronic hypoxemic respiratory failure                 secondary to  COVID-19, sepsis, chronic kidney disease.  Sonographer:    Darlina Sicilian RDCS Referring Phys: [place]  1. Left ventricular ejection fraction, by estimation, is 25 to 30%. The left ventricle has severely decreased function. The left ventricle demonstrates global hypokinesis. Left ventricular diastolic parameters are consistent with Grade II diastolic dysfunction (pseudonormalization). Elevated left atrial pressure.  2. Right ventricular systolic function is normal. The right ventricular size is normal. There is normal pulmonary artery systolic pressure. The estimated right ventricular systolic pressure is 87.5 mmHg.  3. The mitral valve is normal in structure. Mild mitral valve regurgitation.  4. The aortic valve was not well visualized. Aortic valve regurgitation is not visualized. Mild to moderate aortic valve sclerosis/calcification is present, without any evidence of aortic stenosis.  5. The inferior vena cava is normal in size with greater than 50% respiratory variability, suggesting right atrial pressure of 3 mmHg. FINDINGS  Left Ventricle: Left ventricular ejection fraction, by estimation, is 25 to 30%. The left ventricle has severely decreased function. The left ventricle demonstrates global hypokinesis. Definity contrast agent was given IV to delineate the left ventricular endocardial borders. Elevated left atrial pressure. Right Ventricle: The right ventricular size is normal. Right ventricular systolic function is normal. There is normal pulmonary artery systolic pressure. The tricuspid regurgitant velocity is 2.53 m/s, and with an assumed right atrial pressure of 3 mmHg,  the estimated right ventricular systolic pressure is 64.3 mmHg. Pericardium: There is no evidence of pericardial effusion. Mitral Valve: The mitral valve is normal in structure. Mild mitral valve regurgitation. Tricuspid Valve: The tricuspid valve is normal in structure. Tricuspid valve regurgitation is trivial.  Aortic Valve: The aortic valve was not well visualized. Aortic valve regurgitation is not visualized. Mild to moderate aortic valve sclerosis/calcification is present, without any evidence of aortic stenosis. Venous: The inferior vena cava is normal in size with greater than 50% respiratory variability, suggesting right atrial pressure of 3 mmHg. LEFT VENTRICLE PLAX 2D LVIDd:         5.70 cm      Diastology LVIDs:         5.00 cm      LV e' lateral:   2.47 cm/s  LV PW:         0.90 cm      LV E/e' lateral: 32.1 LV IVS:        1.10 cm      LV e' medial:    2.40 cm/s LVOT diam:     1.90 cm      LV E/e' medial:  33.0 LV SV:         44 LV SV Index:   23 LVOT Area:     2.84 cm  LV Volumes (MOD) LV vol d, MOD A2C: 146.0 ml LV vol d, MOD A4C: 185.5 ml LV vol s, MOD A2C: 115.0 ml LV vol s, MOD A4C: 117.5 ml LV SV MOD A2C:     31.0 ml LV SV MOD A4C:     185.5 ml LV SV MOD BP:      47.9 ml LEFT ATRIUM         Index LA diam:    3.30 cm 1.75 cm/m  AORTIC VALVE LVOT Vmax:   67.20 cm/s LVOT Vmean:  47.900 cm/s LVOT VTI:    0.156 m MITRAL VALVE                 TRICUSPID VALVE MV Area (PHT): 3.65 cm      TR Peak grad:   25.6 mmHg MV Decel Time: 208 msec      TR Vmax:        253.00 cm/s MR Peak grad:    123.7 mmHg MR Mean grad:    64.0 mmHg   SHUNTS MR Vmax:         556.00 cm/s Systemic VTI:  0.16 m MR Vmean:        367.0 cm/s  Systemic Diam: 1.90 cm MR PISA:         1.01 cm MR PISA Eff ROA: 7 mm MR PISA Radius:  0.40 cm MV E velocity: 79.30 cm/s MV A velocity: 91.30 cm/s MV E/A ratio:  0.87 Oswaldo Milian MD Electronically signed by Oswaldo Milian MD Signature Date/Time: 01/31/2020/3:57:07 PM    Final

## 2020-02-07 LAB — CBC
HCT: 50.8 % — ABNORMAL HIGH (ref 36.0–46.0)
Hemoglobin: 17.1 g/dL — ABNORMAL HIGH (ref 12.0–15.0)
MCH: 31.6 pg (ref 26.0–34.0)
MCHC: 33.7 g/dL (ref 30.0–36.0)
MCV: 93.9 fL (ref 80.0–100.0)
Platelets: 271 10*3/uL (ref 150–400)
RBC: 5.41 MIL/uL — ABNORMAL HIGH (ref 3.87–5.11)
RDW: 14.5 % (ref 11.5–15.5)
WBC: 20.7 10*3/uL — ABNORMAL HIGH (ref 4.0–10.5)
nRBC: 0 % (ref 0.0–0.2)

## 2020-02-07 LAB — COMPREHENSIVE METABOLIC PANEL
ALT: 19 U/L (ref 0–44)
AST: 17 U/L (ref 15–41)
Albumin: 2.7 g/dL — ABNORMAL LOW (ref 3.5–5.0)
Alkaline Phosphatase: 71 U/L (ref 38–126)
Anion gap: 15 (ref 5–15)
BUN: 109 mg/dL — ABNORMAL HIGH (ref 8–23)
CO2: 26 mmol/L (ref 22–32)
Calcium: 9.8 mg/dL (ref 8.9–10.3)
Chloride: 89 mmol/L — ABNORMAL LOW (ref 98–111)
Creatinine, Ser: 2.19 mg/dL — ABNORMAL HIGH (ref 0.44–1.00)
GFR calc Af Amer: 24 mL/min — ABNORMAL LOW (ref >60)
GFR calc non Af Amer: 20 mL/min — ABNORMAL LOW (ref >60)
Glucose, Bld: 142 mg/dL — ABNORMAL HIGH (ref 70–99)
Potassium: 6 mmol/L — ABNORMAL HIGH (ref 3.5–5.1)
Sodium: 130 mmol/L — ABNORMAL LOW (ref 135–145)
Total Bilirubin: 1.4 mg/dL — ABNORMAL HIGH (ref 0.3–1.2)
Total Protein: 6.4 g/dL — ABNORMAL LOW (ref 6.5–8.1)

## 2020-02-07 LAB — GLUCOSE, CAPILLARY
Glucose-Capillary: 135 mg/dL — ABNORMAL HIGH (ref 70–99)
Glucose-Capillary: 222 mg/dL — ABNORMAL HIGH (ref 70–99)
Glucose-Capillary: 175 mg/dL — ABNORMAL HIGH (ref 70–99)
Glucose-Capillary: 179 mg/dL — ABNORMAL HIGH (ref 70–99)

## 2020-02-07 LAB — D-DIMER, QUANTITATIVE: D-Dimer, Quant: 1.4 ug/mL-FEU — ABNORMAL HIGH (ref 0.00–0.50)

## 2020-02-07 LAB — BASIC METABOLIC PANEL
Anion gap: 16 — ABNORMAL HIGH (ref 5–15)
BUN: 118 mg/dL — ABNORMAL HIGH (ref 8–23)
CO2: 24 mmol/L (ref 22–32)
Calcium: 9.6 mg/dL (ref 8.9–10.3)
Chloride: 89 mmol/L — ABNORMAL LOW (ref 98–111)
Creatinine, Ser: 2.32 mg/dL — ABNORMAL HIGH (ref 0.44–1.00)
GFR calc Af Amer: 22 mL/min — ABNORMAL LOW (ref >60)
GFR calc non Af Amer: 19 mL/min — ABNORMAL LOW (ref >60)
Glucose, Bld: 163 mg/dL — ABNORMAL HIGH (ref 70–99)
Potassium: 5.2 mmol/L — ABNORMAL HIGH (ref 3.5–5.1)
Sodium: 129 mmol/L — ABNORMAL LOW (ref 135–145)

## 2020-02-07 LAB — C-REACTIVE PROTEIN: CRP: <0.5 mg/dL (ref <1.0)

## 2020-02-07 LAB — MAGNESIUM: Magnesium: 2.8 mg/dL — ABNORMAL HIGH (ref 1.7–2.4)

## 2020-02-07 MED ORDER — ISOSORBIDE MONONITRATE ER 30 MG PO TB24
30.0000 mg | ORAL_TABLET | Freq: Every day | ORAL | Status: DC
  Administered 2020-02-07 – 2020-02-10 (×4): 30 mg via ORAL
  Filled 2020-02-07 (×4): qty 1

## 2020-02-07 MED ORDER — INSULIN ASPART 100 UNIT/ML ~~LOC~~ SOLN
8.0000 [IU] | Freq: Once | SUBCUTANEOUS | Status: AC
  Administered 2020-02-07: 8 [IU] via INTRAVENOUS

## 2020-02-07 MED ORDER — PANTOPRAZOLE SODIUM 40 MG PO TBEC
40.0000 mg | DELAYED_RELEASE_TABLET | Freq: Two times a day (BID) | ORAL | Status: DC
  Administered 2020-02-07 – 2020-02-10 (×7): 40 mg via ORAL
  Filled 2020-02-07 (×7): qty 1

## 2020-02-07 MED ORDER — DEXTROSE 50 % IV SOLN
25.0000 mL | Freq: Once | INTRAVENOUS | Status: AC
  Administered 2020-02-07: 25 mL via INTRAVENOUS
  Filled 2020-02-07: qty 50

## 2020-02-07 MED ORDER — SODIUM ZIRCONIUM CYCLOSILICATE 10 G PO PACK
10.0000 g | PACK | Freq: Every day | ORAL | Status: DC
  Administered 2020-02-07 – 2020-02-10 (×4): 10 g via ORAL
  Filled 2020-02-07 (×4): qty 1

## 2020-02-07 NOTE — Plan of Care (Signed)
  Problem: Health Behavior/Discharge Planning: Goal: Ability to manage health-related needs will improve Outcome: Progressing   Problem: Clinical Measurements: Goal: Ability to maintain clinical measurements within normal limits will improve Outcome: Progressing Goal: Will remain free from infection Outcome: Progressing Goal: Diagnostic test results will improve Outcome: Progressing Goal: Cardiovascular complication will be avoided Outcome: Progressing   Problem: Coping: Goal: Level of anxiety will decrease Outcome: Progressing   Problem: Elimination: Goal: Will not experience complications related to bowel motility Outcome: Progressing Goal: Will not experience complications related to urinary retention Outcome: Progressing   Problem: Pain Managment: Goal: General experience of comfort will improve Outcome: Progressing   Problem: Safety: Goal: Ability to remain free from injury will improve Outcome: Progressing   Problem: Skin Integrity: Goal: Risk for impaired skin integrity will decrease Outcome: Progressing   Problem: Respiratory: Goal: Will maintain a patent airway Outcome: Progressing   Problem: Education: Goal: Ability to demonstrate management of disease process will improve Outcome: Progressing Goal: Ability to verbalize understanding of medication therapies will improve Outcome: Progressing Goal: Individualized Educational Video(s) Outcome: Progressing   Problem: Activity: Goal: Capacity to carry out activities will improve Outcome: Progressing   Problem: Cardiac: Goal: Ability to achieve and maintain adequate cardiopulmonary perfusion will improve Outcome: Progressing   Problem: Education: Goal: Knowledge of General Education information will improve Description: Including pain rating scale, medication(s)/side effects and non-pharmacologic comfort measures Outcome: Not Progressing   Problem: Clinical Measurements: Goal: Respiratory  complications will improve Outcome: Not Progressing   Problem: Activity: Goal: Risk for activity intolerance will decrease Outcome: Not Progressing   Problem: Nutrition: Goal: Adequate nutrition will be maintained Outcome: Not Progressing   Problem: Education: Goal: Knowledge of risk factors and measures for prevention of condition will improve Outcome: Not Progressing   Problem: Coping: Goal: Psychosocial and spiritual needs will be supported Outcome: Not Progressing   Problem: Respiratory: Goal: Complications related to the disease process, condition or treatment will be avoided or minimized Outcome: Not Progressing

## 2020-02-07 NOTE — Progress Notes (Signed)
Per [facility], they require patient to be on 65% FiO2 or less. Will continue to monitor.   Tilford Deaton LCSW

## 2020-02-07 NOTE — Progress Notes (Signed)
PT Cancellation Note  Patient Details Name: Sydney Phillips MRN: [id number] DOB: 1936/05/25   Cancelled Treatment:    Reason Eval/Treat Not Completed: Medical issues which prohibited therapy. Patient on [place] with NRB, lethargic with minimal response at attempts to arouse. Noted potential for comfort measures pending progress. Will follow-up for continued PT treatment services as appropriate.  Mabeline Caras, PT, DPT Acute Rehabilitation Services  Pager [phone] Office [place] 02/07/2020, 3:36 PM

## 2020-02-07 NOTE — Progress Notes (Signed)
PROGRESS NOTE                                                                                                                                                                                                             Patient Demographics:    Sydney Phillips, is a 84 y.o. female, DOB - Mar 20, 1936, PJK:[id number]  Outpatient Primary MD for the patient is Sydney Morale, MD   Admit date - 01/30/2020   LOS - 8  Chief Complaint  Patient presents with  . Altered Mental Status    covid +       Brief Narrative: Patient is a 84 y.o. female with PMHx of chronic systolic heart failure s/p CRT, ventricular tachycardia, COPD on home O2-mostly at night, HTN, DM-2, CKD stage IIIb-recently hospitalized from 8/4-8/9 for hypoxia from COVID-19-admitted by PCCM to the ICU for profound hypoxemia requiring heated high flow/NRB due to ongoing Covid pneumonitis with progressive fibrotic changes.  See below for further details.  Vaccination status: Unvaccinated  Significant Events: 7/29-8/1>> admit to [facility] by cardiology for decompensated heart failure 8/5>> COVID-19 positive 8/4-8/9>> hospitalized for hypoxia due to COVID-19 pneumonia-discharged on home O2 8/16>> readmitted to ICU for severe hypoxemia 8/20>> transfer to [facility]  Significant studies: 01/31/2020>> Echo: EF 80-99%, grade 2 diastolic dysfunction 8/33/8250>> chest x-ray: Widespread interstitial/airspace opacity bilaterally. 01/30/2020>> chest x-ray: New extensive patchy opacities throughout both lungs. 01/19/2020>>Chest x-ray: Cardiomegaly with bilateral interstitial prominence. 08/24/2019>> echo: EF 20-25%  COVID-19 medications: Remdesivir: 8/5>> 8/9 Steroids: 8/16>>  Antibiotics: Cefepime: 8/16>> 8/22 Vancomycin: 8/16>> 8/18  Microbiology data: 8/16>> blood culture: No growth 8/5 >>blood culture: No growth  Procedures: None  Consults: PCCM  DVT prophylaxis: enoxaparin  (LOVENOX) injection 30 mg Start: 01/31/20 1400 SCDs Start: 01/31/20 0926    Subjective:   Had significant chest pain yesterday-that slowly resolved spontaneously throughout the evening yesterday.  Chest pain-free this morning.  Still appears very frail and deconditioned-remains on heated high flow.   Assessment  & Plan :   Acute on chronic hypoxic hypoxic Resp Failure (on home O2 2 L nocturnally) due to Covid 19 Viral pneumonia with progressive fibrotic changes (organizing pneumonia) superimposed on HCAP: Hypoxia remains essentially the same of the past few days-some minimal decrease in the FiO2 at times.  Remains on heated high flow.  Amiodarone discontinued on 8/21.  Plans are  for a long steroid taper as recommended by PCCM.   Her overall prognosis is very poor-she is already a DNR-plans are to watch closely and hope for clinical improvement-however if in the meantime she were to deteriorate significantly-apart from transitioning to comfort measures-we really do not have any other options (patient-son (Sydney Phillips) aware)  Fever: afebrile O2 requirements:  SpO2: 94 % O2 Flow Rate (L/min): 25 L/min FiO2 (%): 100 %   COVID-19 Labs: Recent Labs    02/05/20 0404 02/06/20 0419 02/07/20 0659  DDIMER 2.12* 1.59* 1.40*  CRP 1.4* 1.1* <0.5       Component Value Date/Time   BNP 319.6 (H) 01/30/2020 1723   BNP 540.2 (H) 08/07/2016 1433    No results for input(s): PROCALCITON in the last 168 hours.  Lab Results  Component Value Date   SARSCOV2NAA POSITIVE (A) 01/19/2020   [place] NEGATIVE 01/12/2020   [place] NEGATIVE 12/11/2019   [place] NEGATIVE 12/05/2019     Prone/Incentive Spirometry: encouraged incentive spirometry use 3-4/hour.  Hyperkalemia: We will give 1 dose of IV insulin/dextrose-place on Lokelma-already on Demadex.  Repeat electrolytes later today.  Chest pain: Occurred on 8/23 evening-troponins negative.  EKG without any major changes.  Has known CAD.   However could have been noncardiac pain-and GI in origin.  Change PPI to twice daily-continue with aspirin/Plavix/statin/beta-blocker.  Per my discussion with Dr. Sallyanne Phillips on 8/23-given acute illness-severe hypoxemia-really not a candidate for any intervention at this point.  CAD: No anginal symptoms-continue aspirin/Plavix/beta-blocker follow.  Last [facility] May 2021 showed stable anatomy.  Chronic systolic heart failure: Volume status is stable-diuretics resumed at daily dosing.  Watch creatinine closely.  AKI on CKD stage IIIb: Creatinine still somewhat elevated than usual baseline-but will try and keep patient on the dry side-on daily dosing of Demadex (previously on twice daily)  PAD: History of subclavian stenting in 2013-stable.  History of ventricular tachycardia-s/p ICD shocks prompting her last hospitalization: Continue telemetry monitoring- amiodarone discontinued due to concern that it may be exacerbating lung fibrosis.  Case was discussed with cardiologist-Dr. Haroldine Phillips on 8/21.  Recommendations were to monitor on telemetry-she has an ICD in place.  Potassium/magnesium stable.  DM-2 (A1c 6.7 on 7/31) with uncontrolled hyperglycemia secondary to steroids: CBGs remain on the higher side-continue Lantus 18 units and SSI-follow and adjust.   Recent Labs    02/06/20 2142 02/07/20 0814 02/07/20 1204  GLUCAP 227* 135* 222*    Depression/anxiety: Appears stable-continue Cymbalta and Xanax.  Nondisplaced left wrist fracture: Continue with splint-WABT through elbow.  Follow-up with Dr. Jeannie Phillips in 2 weeks.  Was evaluated by orthopedics on 7/29.  Debility/deconditioning: Secondary to acute illness-evaluated by PT with recommendations for home health on discharge.  Palliative care discussion: DNR in place.  Have discussed with patient's son Sydney Phillips multiple times over the past few days-he is aware of patient's very poor overall prognosis-and the potential that she could deteriorate further.  She  is already with severe hypoxia.  He is aware that if she were to deteriorate any further-apart from transitioning to hospice care we really do not have any other options.  However she has been stable-comfortable-over the past few days.  ABG:    Component Value Date/Time   PHART 7.432 12/05/2019 1343   PCO2ART 53.9 (H) 12/05/2019 1343   PO2ART 94 12/05/2019 1343   HCO3 33.0 (H) 01/30/2020 1726   TCO2 34 (H) 01/30/2020 1726   ACIDBASEDEF 6.0 (H) 04/06/2016 1242   O2SAT 51.0 01/30/2020 1726    Vent Settings:  N/A FiO2 (%):  [80 %-100 %] 100 %  Condition - Stable  Family Communication  : Son-Sydney Phillips ([phone]) updated on 8/24  Code Status :  Full Code  Diet :  Diet Order            Diet regular Room service appropriate? Yes; Fluid consistency: Thin  Diet effective now                  Disposition Plan  :   Status is: Inpatient  Remains inpatient appropriate because:Inpatient level of care appropriate due to severity of illness  Dispo: The patient is from: Home              Anticipated d/c is to: Home              Anticipated d/c date is:>3 days              Patient currently is not medically stable to d/c.   Barriers to discharge: Hypoxia requiring O2 supplementation  Antimicorbials  :    Anti-infectives (From admission, onward)   Start     Dose/Rate Route Frequency Ordered Stop   01/31/20 2100  vancomycin (VANCOCIN) IVPB 1000 mg/200 mL premix  Status:  Discontinued        1,000 mg 200 mL/hr over 60 Minutes Intravenous Every 24 hours 01/30/20 1931 02/02/20 0928   01/31/20 2000  ceFEPIme (MAXIPIME) 2 g in sodium chloride 0.9 % 100 mL IVPB  Status:  Discontinued        2 g 200 mL/hr over 30 Minutes Intravenous Every 24 hours 01/30/20 1931 02/06/20 1224   01/30/20 1830  vancomycin (VANCOREADY) IVPB 1500 mg/300 mL        1,500 mg 150 mL/hr over 120 Minutes Intravenous  Once 01/30/20 1823 01/30/20 2350   01/30/20 1830  ceFEPIme (MAXIPIME) 2 g in sodium chloride 0.9 %  100 mL IVPB        2 g 200 mL/hr over 30 Minutes Intravenous  Once 01/30/20 1823 01/30/20 1955      Inpatient Medications  Scheduled Meds: . allopurinol  300 mg Oral Daily  . vitamin C  250 mg Oral Daily  . aspirin  81 mg Oral Daily  . bisoprolol  5 mg Oral Daily  . Chlorhexidine Gluconate Cloth  6 each Topical Q0600  . cholecalciferol  4,000 Units Oral Daily  . clopidogrel  75 mg Oral Daily  . DULoxetine  60 mg Oral BID  . enoxaparin (LOVENOX) injection  30 mg Subcutaneous Q24H  . insulin aspart  0-15 Units Subcutaneous TID WC  . insulin glargine  18 Units Subcutaneous Daily  . Ipratropium-Albuterol  1 puff Inhalation TID  . isosorbide mononitrate  30 mg Oral Daily  . loratadine  10 mg Oral Daily  . mouth rinse  15 mL Mouth Rinse BID  . mupirocin ointment  1 application Nasal BID  . pantoprazole  40 mg Oral BID  . predniSONE  50 mg Oral Q breakfast   Followed by  . [START ON 02/12/2020] predniSONE  40 mg Oral Q breakfast   Followed by  . [START ON 02/19/2020] predniSONE  30 mg Oral Q breakfast   Followed by  . [START ON 02/26/2020] predniSONE  20 mg Oral Q breakfast   Followed by  . [START ON 03/04/2020] predniSONE  10 mg Oral Q breakfast   Followed by  . [START ON 03/11/2020] predniSONE  5 mg Oral Q breakfast  . sodium zirconium  cyclosilicate  10 g Oral Daily  . torsemide  80 mg Oral Daily  . vitamin B-12  2,000 mcg Oral Daily   Continuous Infusions: . sodium chloride Stopped (02/05/20 0120)   PRN Meds:.sodium chloride, ALPRAZolam, docusate sodium, docusate sodium, morphine injection, nitroGLYCERIN, ondansetron (ZOFRAN) IV, polyethylene glycol   Time Spent in minutes  25   See all Orders from today for further details   Oren Binet M.D on 02/07/2020 at 2:00 PM  To page go to [email or web address] - use universal password  [facility] -  Office  [phone]    Objective:   Vitals:   02/07/20 0802 02/07/20 0822 02/07/20 1100 02/07/20 1315  BP: (!) 132/101   (!) 139/59   Pulse: 60  60   Resp: 20  18   Temp: 97.8 F (36.6 C)  97.6 F (36.4 C)   TempSrc: Axillary  Axillary   SpO2: 95% 100% 95% 94%  Weight:      Height:        Wt Readings from Last 3 Encounters:  02/07/20 79.4 kg  01/23/20 81.1 kg  01/15/20 81.1 kg     Intake/Output Summary (Last 24 hours) at 02/07/2020 1400 Last data filed at 02/07/2020 1201 Gross per 24 hour  Intake 240 ml  Output --  Net 240 ml     Physical Exam Gen Exam:Alert awake-not in any distress HEENT:atraumatic, normocephalic Chest: B/L clear to auscultation anteriorly CVS:S1S2 regular Abdomen:soft non tender, non distended Extremities:no edema Neurology: Non focal Skin: no rash   Data Review:    CBC Recent Labs  Lab 02/01/20 0447 02/04/20 0923 02/05/20 0832 02/06/20 0419 02/07/20 0659  WBC 21.5* 16.7* 20.8* 16.6* 20.7*  HGB 14.6 15.9* 17.0* 16.7* 17.1*  HCT 44.9 48.7* 49.8* 50.5* 50.8*  PLT 206 247 298 280 271  MCV 96.8 94.9 93.8 93.0 93.9  MCH 31.5 31.0 32.0 30.8 31.6  MCHC 32.5 32.6 34.1 33.1 33.7  RDW 15.1 14.6 14.5 14.5 14.5    Chemistries  Recent Labs  Lab 02/01/20 0447 02/02/20 1025 02/03/20 0945 02/04/20 0923 02/05/20 0404 02/06/20 0419 02/07/20 0659  NA 134*   < > 131* 130* 131* 133* 130*  K 4.2   < > 4.8 4.8 5.1 5.3* 6.0*  CL 96*   < > 90* 88* 90* 92* 89*  CO2 25   < > 24 26 26 28 26   GLUCOSE 205*   < > 323* 336* 150* 160* 142*  BUN 58*   < > 70* 86* 95* 101* 109*  CREATININE 1.86*   < > 1.89* 2.21* 2.10* 2.07* 2.19*  CALCIUM 9.0   < > 9.6 9.7 9.8 9.5 9.8  MG 1.9  --   --  2.3 2.5* 3.0* 2.8*  AST  --    < > 27 18 16 16 17   ALT  --    < > 33 31 28 24 19   ALKPHOS  --    < > 72 74 74 76 71  BILITOT  --    < > 0.7 0.8 0.6 0.8 1.4*   < > = values in this interval not displayed.   ------------------------------------------------------------------------------------------------------------------ No results for input(s): CHOL, HDL, LDLCALC, TRIG, CHOLHDL,  LDLDIRECT in the last 72 hours.  Lab Results  Component Value Date   HGBA1C 6.7 (H) 01/14/2020   ------------------------------------------------------------------------------------------------------------------ No results for input(s): TSH, T4TOTAL, T3FREE, THYROIDAB in the last 72 hours.  Invalid input(s): FREET3 ------------------------------------------------------------------------------------------------------------------ No results for input(s): VITAMINB12, FOLATE, FERRITIN, TIBC, IRON, RETICCTPCT  in the last 72 hours.  Coagulation profile No results for input(s): INR, PROTIME in the last 168 hours.  Recent Labs    02/06/20 0419 02/07/20 0659  DDIMER 1.59* 1.40*    Cardiac Enzymes No results for input(s): CKMB, TROPONINI, MYOGLOBIN in the last 168 hours.  Invalid input(s): CK ------------------------------------------------------------------------------------------------------------------    Component Value Date/Time   BNP 319.6 (H) 01/30/2020 1723   BNP 540.2 (H) 08/07/2016 1433    Micro Results Recent Results (from the past 240 hour(s))  Blood Culture (routine x 2)     Status: None   Collection Time: 01/30/20  5:23 PM   Specimen: BLOOD RIGHT HAND  Result Value Ref Range Status   Specimen Description BLOOD RIGHT HAND  Final   Special Requests   Final    BOTTLES DRAWN AEROBIC ONLY Blood Culture results may not be optimal due to an inadequate volume of blood received in culture bottles   Culture   Final    NO GROWTH 5 DAYS Performed at [facility], [place] [address] [place], [place] [address]    Report Status 02/04/2020 FINAL  Final  Blood Culture (routine x 2)     Status: None   Collection Time: 01/30/20  6:26 PM   Specimen: BLOOD RIGHT WRIST  Result Value Ref Range Status   Specimen Description BLOOD RIGHT WRIST  Final   Special Requests   Final    BOTTLES DRAWN AEROBIC ONLY Blood Culture results may not be optimal due to an inadequate volume of  blood received in culture bottles   Culture   Final    NO GROWTH 5 DAYS Performed at [facility], [place] [address] [place], [place] [address]    Report Status 02/04/2020 FINAL  Final  MRSA PCR Screening     Status: Abnormal   Collection Time: 02/05/20  5:47 AM   Specimen: Nasal Mucosa; Nasopharyngeal  Result Value Ref Range Status   MRSA by PCR POSITIVE (A) NEGATIVE Final    Comment:        The GeneXpert MRSA Assay (FDA approved for NASAL specimens only), is one component of a comprehensive MRSA colonization surveillance program. It is not intended to diagnose MRSA infection nor to guide or monitor treatment for MRSA infections. RESULT CALLED TO, READ BACK BY AND VERIFIED WITH: LIGHTNER,N RN 02/05/2020 AT 1884 SKEEN,P Performed at [facility], [place] [address] [place], [place] [address]     Radiology Reports DG Chest 2 View  Result Date: 01/19/2020 CLINICAL DATA:  Fatigue.  History of CHF. EXAM: CHEST - 2 VIEW COMPARISON:  12/05/2019.  09/30/2019. FINDINGS: AICD in stable position. Again noted cardiomegaly with bilateral interstitial prominence suggesting CHF. Small bilateral pleural effusion may be present. Calcified left lung nodule consistent with granuloma again noted. No pneumothorax. Degenerative change thoracic spine. Lower thoracic spine compression fracture noted on today's exam. IMPRESSION: 1. AICD in stable position. Cardiomegaly with bilateral interstitial prominence again noted suggesting CHF. Similar findings noted on prior exam. Small bilateral pleural effusions may be present. 2. Lower thoracic vertebral body compression fracture may be present. Electronically Signed   By: Marcello Moores  Register   On: 01/19/2020 05:14   DG Chest 2 View  Result Date: 01/12/2020 CLINICAL DATA:  Chest pain. EXAM: CHEST - 2 VIEW COMPARISON:  12/05/2019. FINDINGS: AICD in stable position. Again noted cardiomegaly with pulmonary venous congestion bilateral interstitial prominence and  small bilateral pleural effusions. Findings consistent CHF. Similar findings noted on prior exam.  Calcified pulmonary nodules noted consistent prior granulomas disease. No pneumothorax. IMPRESSION: AICD in stable position. Cardiomegaly with pulmonary venous congestion, bilateral interstitial prominence, and small bilateral pleural effusions again noted. Findings again consistent with CHF. Similar findings noted on prior exam. Electronically Signed   By: Marcello Moores  Register   On: 01/12/2020 05:20   DG Wrist Complete Left  Result Date: 01/12/2020 CLINICAL DATA:  Persistent left wrist pain after fall 1 month ago EXAM: LEFT WRIST - COMPLETE 3+ VIEW COMPARISON:  12/26/2019 FINDINGS: No definite acute fracture is identified. Possible subtle horizontal lucency at the distal ulna could reflect a subacute nondisplaced fracture. No definite fracture line is seen in at the radial styloid. Mildly widened scapholunate interval of 3 mm. Similar degree of osteoarthritis, most pronounced at the first [facility] and triscaphe joints. Bones are demineralized. Soft tissue swelling persists. IMPRESSION: 1. Possible subtle subacute nondisplaced fracture at the distal ulna. Correlate with point tenderness. 2. No definite acute or healing distal radial fracture is identified. 3. Mildly widened scapholunate interval of 3 mm suggesting underlying ligamentous injury. Electronically Signed   By: Davina Poke D.O.   On: 01/12/2020 08:47   DG Chest Port 1 View  Result Date: 01/31/2020 CLINICAL DATA:  Hypoxia.  Reported COVID-19 positive EXAM: PORTABLE CHEST 1 VIEW COMPARISON:  January 30, 2020 and January 19, 2020 FINDINGS: Extensive patchy interstitial and airspace opacity bilaterally is essentially stable compared to 1 day prior with significant progression of disease since 2 weeks prior. Mild consolidation in the left base is stable. There is cardiomegaly with pulmonary vascularity within normal limits. Pacemaker leads attached to right  atrium, right ventricle, and coronary sinus. No adenopathy appreciable. There is aortic atherosclerosis. No bone lesions. IMPRESSION: Widespread interstitial and airspace opacity bilaterally, stable compared to 1 day prior. Mild consolidation left base. No new opacity evident. Stable cardiac prominence. Stable pacemaker lead positioning. No adenopathy appreciable. Aortic Atherosclerosis (ICD10-I70.0). Electronically Signed   By: Lowella Grip III M.D.   On: 01/31/2020 11:38   DG Chest Port 1 View  Result Date: 01/30/2020 CLINICAL DATA:  COVID-19 positive EXAM: PORTABLE CHEST 1 VIEW COMPARISON:  01/26/2020 chest radiograph. FINDINGS: Stable 3 lead left subclavian ICD. Stable cardiomediastinal silhouette with moderate cardiomegaly. No pneumothorax. No pleural effusion. Extensive patchy opacities throughout both lungs are new. IMPRESSION: New extensive patchy opacities throughout both lungs, differential includes COVID-19 pneumonia and/or cardiogenic pulmonary edema given moderate cardiomegaly. Electronically Signed   By: Ilona Sorrel M.D.   On: 01/30/2020 18:07   ECHOCARDIOGRAM LIMITED  Result Date: 01/31/2020    ECHOCARDIOGRAM LIMITED REPORT   Patient Name:   DAKODA BASSETTE Rochford Date of Exam: 01/31/2020 Medical Rec #:  [id number]         Height:       65.0 in Accession #:    [id number]        Weight:       178.8 lb Date of Birth:  [birth year]         BSA:          1.886 m Patient Age:    21 years          BP:           133/63 mmHg Patient Gender: F                 HR:           59 bpm. Exam Location:  Inpatient Procedure: Intracardiac Opacification Agent, Limited Echo, Limited Color Doppler  and Cardiac Doppler Indications:    Dyspnea 786.09 / R06.00  History:        Patient has prior history of Echocardiogram examinations, most                 recent 08/24/2019. CHF, Defibrillator, COPD, Pulmonary HTN and                 PAD, Arrythmias:LBBB; Risk Factors:Former Smoker, Hypertension                  and Dyslipidemia. Chronic hypoxemic respiratory failure                 secondary to COVID-19, sepsis, chronic kidney disease.  Sonographer:    Darlina Sicilian RDCS Referring Phys: [place]  1. Left ventricular ejection fraction, by estimation, is 25 to 30%. The left ventricle has severely decreased function. The left ventricle demonstrates global hypokinesis. Left ventricular diastolic parameters are consistent with Grade II diastolic dysfunction (pseudonormalization). Elevated left atrial pressure.  2. Right ventricular systolic function is normal. The right ventricular size is normal. There is normal pulmonary artery systolic pressure. The estimated right ventricular systolic pressure is 65.0 mmHg.  3. The mitral valve is normal in structure. Mild mitral valve regurgitation.  4. The aortic valve was not well visualized. Aortic valve regurgitation is not visualized. Mild to moderate aortic valve sclerosis/calcification is present, without any evidence of aortic stenosis.  5. The inferior vena cava is normal in size with greater than 50% respiratory variability, suggesting right atrial pressure of 3 mmHg. FINDINGS  Left Ventricle: Left ventricular ejection fraction, by estimation, is 25 to 30%. The left ventricle has severely decreased function. The left ventricle demonstrates global hypokinesis. Definity contrast agent was given IV to delineate the left ventricular endocardial borders. Elevated left atrial pressure. Right Ventricle: The right ventricular size is normal. Right ventricular systolic function is normal. There is normal pulmonary artery systolic pressure. The tricuspid regurgitant velocity is 2.53 m/s, and with an assumed right atrial pressure of 3 mmHg,  the estimated right ventricular systolic pressure is 35.4 mmHg. Pericardium: There is no evidence of pericardial effusion. Mitral Valve: The mitral valve is normal in structure. Mild mitral valve regurgitation. Tricuspid Valve:  The tricuspid valve is normal in structure. Tricuspid valve regurgitation is trivial. Aortic Valve: The aortic valve was not well visualized. Aortic valve regurgitation is not visualized. Mild to moderate aortic valve sclerosis/calcification is present, without any evidence of aortic stenosis. Venous: The inferior vena cava is normal in size with greater than 50% respiratory variability, suggesting right atrial pressure of 3 mmHg. LEFT VENTRICLE PLAX 2D LVIDd:         5.70 cm      Diastology LVIDs:         5.00 cm      LV e' lateral:   2.47 cm/s LV PW:         0.90 cm      LV E/e' lateral: 32.1 LV IVS:        1.10 cm      LV e' medial:    2.40 cm/s LVOT diam:     1.90 cm      LV E/e' medial:  33.0 LV SV:         44 LV SV Index:   23 LVOT Area:     2.84 cm  LV Volumes (MOD) LV vol d, MOD A2C: 146.0 ml LV vol d, MOD A4C: 185.5 ml  LV vol s, MOD A2C: 115.0 ml LV vol s, MOD A4C: 117.5 ml LV SV MOD A2C:     31.0 ml LV SV MOD A4C:     185.5 ml LV SV MOD BP:      47.9 ml LEFT ATRIUM         Index LA diam:    3.30 cm 1.75 cm/m  AORTIC VALVE LVOT Vmax:   67.20 cm/s LVOT Vmean:  47.900 cm/s LVOT VTI:    0.156 m MITRAL VALVE                 TRICUSPID VALVE MV Area (PHT): 3.65 cm      TR Peak grad:   25.6 mmHg MV Decel Time: 208 msec      TR Vmax:        253.00 cm/s MR Peak grad:    123.7 mmHg MR Mean grad:    64.0 mmHg   SHUNTS MR Vmax:         556.00 cm/s Systemic VTI:  0.16 m MR Vmean:        367.0 cm/s  Systemic Diam: 1.90 cm MR PISA:         1.01 cm MR PISA Eff ROA: 7 mm MR PISA Radius:  0.40 cm MV E velocity: 79.30 cm/s MV A velocity: 91.30 cm/s MV E/A ratio:  0.87 Oswaldo Milian MD Electronically signed by Oswaldo Milian MD Signature Date/Time: 01/31/2020/3:57:07 PM    Final

## 2020-02-08 LAB — COMPREHENSIVE METABOLIC PANEL
ALT: 20 U/L (ref 0–44)
AST: 17 U/L (ref 15–41)
Albumin: 2.6 g/dL — ABNORMAL LOW (ref 3.5–5.0)
Alkaline Phosphatase: 71 U/L (ref 38–126)
Anion gap: 14 (ref 5–15)
BUN: 117 mg/dL — ABNORMAL HIGH (ref 8–23)
CO2: 27 mmol/L (ref 22–32)
Calcium: 9.3 mg/dL (ref 8.9–10.3)
Chloride: 89 mmol/L — ABNORMAL LOW (ref 98–111)
Creatinine, Ser: 2.28 mg/dL — ABNORMAL HIGH (ref 0.44–1.00)
GFR calc Af Amer: 22 mL/min — ABNORMAL LOW (ref >60)
GFR calc non Af Amer: 19 mL/min — ABNORMAL LOW (ref >60)
Glucose, Bld: 124 mg/dL — ABNORMAL HIGH (ref 70–99)
Potassium: 5 mmol/L (ref 3.5–5.1)
Sodium: 130 mmol/L — ABNORMAL LOW (ref 135–145)
Total Bilirubin: 1.1 mg/dL (ref 0.3–1.2)
Total Protein: 6.2 g/dL — ABNORMAL LOW (ref 6.5–8.1)

## 2020-02-08 LAB — D-DIMER, QUANTITATIVE: D-Dimer, Quant: 1.27 ug/mL-FEU — ABNORMAL HIGH (ref 0.00–0.50)

## 2020-02-08 LAB — MAGNESIUM: Magnesium: 2.7 mg/dL — ABNORMAL HIGH (ref 1.7–2.4)

## 2020-02-08 LAB — CBC
HCT: 48.1 % — ABNORMAL HIGH (ref 36.0–46.0)
Hemoglobin: 16.3 g/dL — ABNORMAL HIGH (ref 12.0–15.0)
MCH: 31.2 pg (ref 26.0–34.0)
MCHC: 33.9 g/dL (ref 30.0–36.0)
MCV: 92.1 fL (ref 80.0–100.0)
Platelets: 263 10*3/uL (ref 150–400)
RBC: 5.22 MIL/uL — ABNORMAL HIGH (ref 3.87–5.11)
RDW: 14.6 % (ref 11.5–15.5)
WBC: 17.3 10*3/uL — ABNORMAL HIGH (ref 4.0–10.5)
nRBC: 0 % (ref 0.0–0.2)

## 2020-02-08 LAB — GLUCOSE, CAPILLARY
Glucose-Capillary: 139 mg/dL — ABNORMAL HIGH (ref 70–99)
Glucose-Capillary: 169 mg/dL — ABNORMAL HIGH (ref 70–99)
Glucose-Capillary: 250 mg/dL — ABNORMAL HIGH (ref 70–99)
Glucose-Capillary: 258 mg/dL — ABNORMAL HIGH (ref 70–99)

## 2020-02-08 LAB — C-REACTIVE PROTEIN: CRP: 1.2 mg/dL — ABNORMAL HIGH (ref <1.0)

## 2020-02-08 MED ORDER — ALLOPURINOL 100 MG PO TABS
200.0000 mg | ORAL_TABLET | Freq: Every day | ORAL | Status: DC
  Administered 2020-02-09 – 2020-02-10 (×2): 200 mg via ORAL
  Filled 2020-02-08 (×2): qty 2

## 2020-02-08 NOTE — Progress Notes (Signed)
PROGRESS NOTE                                                                                                                                                                                                             Patient Demographics:    Sydney Phillips, is a 84 y.o. female, DOB - 1935/09/08, SNK:[id number]  Outpatient Primary MD for the patient is Sydney Morale, MD   Admit date - 01/30/2020   LOS - 64  Chief Complaint  Patient presents with   Altered Mental Status    covid +       Brief Narrative: Patient is a 84 y.o. female with PMHx of chronic systolic heart failure s/p CRT, ventricular tachycardia, COPD on home O2-mostly at night, HTN, DM-2, CKD stage IIIb-recently hospitalized from 8/4-8/9 for hypoxia from COVID-19-admitted by PCCM to the ICU for profound hypoxemia requiring heated high flow/NRB due to ongoing Covid pneumonitis with progressive fibrotic changes.  See below for further details.  Vaccination status: Unvaccinated  Significant Events: 7/29-8/1>> admit to [facility] by cardiology for decompensated heart failure 8/5>> COVID-19 positive 8/4-8/9>> hospitalized for hypoxia due to COVID-19 pneumonia-discharged on home O2 8/16>> readmitted to ICU for severe hypoxemia 8/20>> transfer to [facility]  Significant studies: 01/31/2020>> Echo: EF 93-79%, grade 2 diastolic dysfunction 0/24/0973>> chest x-ray: Widespread interstitial/airspace opacity bilaterally. 01/30/2020>> chest x-ray: New extensive patchy opacities throughout both lungs. 01/19/2020>>Chest x-ray: Cardiomegaly with bilateral interstitial prominence. 08/24/2019>> echo: EF 20-25%  COVID-19 medications: Remdesivir: 8/5>> 8/9 Steroids: 8/16>>  Antibiotics: Cefepime: 8/16>> 8/22 Vancomycin: 8/16>> 8/18  Microbiology data: 8/16>> blood culture: No growth 8/5 >>blood culture: No growth  Procedures: None  Consults: PCCM  DVT prophylaxis: enoxaparin  (LOVENOX) injection 30 mg Start: 01/31/20 1400 SCDs Start: 01/31/20 5329    Subjective:   Somewhat difficult today-claims that "we are doing nothing for her".  Per nursing staff stable and really no major issues overnight.  Still on heated high flow-per RN sometimes requires NRB in addition to heated high flow.   Assessment  & Plan :   Acute on chronic hypoxic hypoxic Resp Failure (on home O2 2 L nocturnally) due to Covid 19 Viral pneumonia with progressive fibrotic changes (organizing pneumonia) superimposed on HCAP: Initially same for the past few days-on heated high flow-at times requires NRB.  Amiodarone discontinued on 8/21-continue steroid taper.  Her  overall prognosis is very poor-she is already a DNR-plans are to watch closely and hope for clinical improvement-however if in the meantime she were to deteriorate significantly-apart from transitioning to comfort measures-we really do not have any other options (patient-son (Sydney Phillips) aware)  Fever: afebrile O2 requirements:  SpO2: 92 % O2 Flow Rate (L/min): 25 L/min FiO2 (%): 80 %   COVID-19 Labs: Recent Labs    02/06/20 0419 02/07/20 0659 02/08/20 0801  DDIMER 1.59* 1.40* 1.27*  CRP 1.1* <0.5 1.2*       Component Value Date/Time   BNP 319.6 (H) 01/30/2020 1723   BNP 540.2 (H) 08/07/2016 1433    No results for input(s): PROCALCITON in the last 168 hours.  Lab Results  Component Value Date   SARSCOV2NAA POSITIVE (A) 01/19/2020   [place] NEGATIVE 01/12/2020   [place] NEGATIVE 12/11/2019   [place] NEGATIVE 12/05/2019     Prone/Incentive Spirometry: encouraged incentive spirometry use 3-4/hour.  Hyperkalemia: Much better-continue Lokelma and Demadex.  Given insulin/D50 on 8/24.    Chest pain: Occurred on 8/23 evening-troponins negative.  EKG without any major changes.  Has known CAD.  However could have been noncardiac pain-and GI in origin.  Change PPI to twice daily-continue with  aspirin/Plavix/statin/beta-blocker.  Per my discussion with Sydney Phillips on 8/23-given acute illness-severe hypoxemia-really not a candidate for any intervention at this point.  CAD: No anginal symptoms-continue aspirin/Plavix/beta-blocker follow.  Last [facility] May 2021 showed stable anatomy.  Chronic systolic heart failure: Volume status is stable-diuretics resumed at daily dosing.  Watch creatinine closely.  AKI on CKD stage IIIb: Creatinine still somewhat elevated than usual baseline-but will try and keep patient on the dry side-on daily dosing of Demadex (previously on twice daily)  PAD: History of subclavian stenting in 2013-stable.  History of ventricular tachycardia-s/p ICD shocks prompting her last hospitalization: Continue telemetry monitoring- amiodarone discontinued due to concern that it may be exacerbating lung fibrosis.  Case was discussed with cardiologist-Sydney Phillips on 8/21.  Recommendations were to monitor on telemetry-she has an ICD in place.  Potassium/magnesium stable.  DM-2 (A1c 6.7 on 7/31) with uncontrolled hyperglycemia secondary to steroids: CBGs remain on the higher side-continue Lantus 18 units and SSI-follow and adjust.   Recent Labs    02/07/20 2207 02/08/20 0814 02/08/20 1155  GLUCAP 179* 139* 169*    Depression/anxiety: Appears stable-continue Cymbalta and Xanax.  Nondisplaced left wrist fracture: Continue with splint-WABT through elbow.  Follow-up with Sydney Phillips in 2 weeks.  Was evaluated by orthopedics on 7/29.  Debility/deconditioning: Secondary to acute illness-evaluated by PT with recommendations for home health on discharge.  Palliative care discussion: DNR in place.  Have discussed with patient's son Sydney Phillips multiple times over the past few days-he is aware of patient's very poor overall prognosis-and the potential that she could deteriorate further.  She is already with severe hypoxia.  He is aware that if she were to deteriorate any further-apart  from transitioning to hospice care we really do not have any other options.  However she has been stable-comfortable-over the past few days-plans are to continue supportive care steroids and follow clinical course.  ABG:    Component Value Date/Time   PHART 7.432 12/05/2019 1343   PCO2ART 53.9 (H) 12/05/2019 1343   PO2ART 94 12/05/2019 1343   HCO3 33.0 (H) 01/30/2020 1726   TCO2 34 (H) 01/30/2020 1726   ACIDBASEDEF 6.0 (H) 04/06/2016 1242   O2SAT 51.0 01/30/2020 1726    Vent Settings: N/A FiO2 (%):  [80 %-100 %]  80 %  Condition - Stable  Family Communication  : Son-Sydney Phillips [phone]) updated on 8/25-he is aware that I will now call him on 8/27 unless things are dramatically different on 8/26.  Code Status :  Full Code  Diet :  Diet Order            Diet regular Room service appropriate? Yes; Fluid consistency: Thin  Diet effective now                  Disposition Plan  :   Status is: Inpatient  Remains inpatient appropriate because:Inpatient level of care appropriate due to severity of illness  Dispo: The patient is from: Home              Anticipated d/c is to: Home              Anticipated d/c date is:>3 days              Patient currently is not medically stable to d/c.   Barriers to discharge: Hypoxia requiring O2 supplementation  Antimicorbials  :    Anti-infectives (From admission, onward)   Start     Dose/Rate Route Frequency Ordered Stop   01/31/20 2100  vancomycin (VANCOCIN) IVPB 1000 mg/200 mL premix  Status:  Discontinued        1,000 mg 200 mL/hr over 60 Minutes Intravenous Every 24 hours 01/30/20 1931 02/02/20 0928   01/31/20 2000  ceFEPIme (MAXIPIME) 2 g in sodium chloride 0.9 % 100 mL IVPB  Status:  Discontinued        2 g 200 mL/hr over 30 Minutes Intravenous Every 24 hours 01/30/20 1931 02/06/20 1224   01/30/20 1830  vancomycin (VANCOREADY) IVPB 1500 mg/300 mL        1,500 mg 150 mL/hr over 120 Minutes Intravenous  Once 01/30/20 1823 01/30/20  2350   01/30/20 1830  ceFEPIme (MAXIPIME) 2 g in sodium chloride 0.9 % 100 mL IVPB        2 g 200 mL/hr over 30 Minutes Intravenous  Once 01/30/20 1823 01/30/20 1955      Inpatient Medications  Scheduled Meds:  allopurinol  300 mg Oral Daily   vitamin C  250 mg Oral Daily   aspirin  81 mg Oral Daily   bisoprolol  5 mg Oral Daily   Chlorhexidine Gluconate Cloth  6 each Topical Q0600   cholecalciferol  4,000 Units Oral Daily   clopidogrel  75 mg Oral Daily   DULoxetine  60 mg Oral BID   enoxaparin (LOVENOX) injection  30 mg Subcutaneous Q24H   insulin aspart  0-15 Units Subcutaneous TID WC   insulin glargine  18 Units Subcutaneous Daily   Ipratropium-Albuterol  1 puff Inhalation TID   isosorbide mononitrate  30 mg Oral Daily   loratadine  10 mg Oral Daily   mouth rinse  15 mL Mouth Rinse BID   mupirocin ointment  1 application Nasal BID   pantoprazole  40 mg Oral BID   predniSONE  50 mg Oral Q breakfast   Followed by   Derrill Memo ON 02/12/2020] predniSONE  40 mg Oral Q breakfast   Followed by   Derrill Memo ON 02/19/2020] predniSONE  30 mg Oral Q breakfast   Followed by   Derrill Memo ON 02/26/2020] predniSONE  20 mg Oral Q breakfast   Followed by   Derrill Memo ON 03/04/2020] predniSONE  10 mg Oral Q breakfast   Followed by   Derrill Memo ON 03/11/2020] predniSONE  5 mg Oral Q breakfast   sodium zirconium cyclosilicate  10 g Oral Daily   torsemide  80 mg Oral Daily   vitamin B-12  2,000 mcg Oral Daily   Continuous Infusions:  sodium chloride Stopped (02/05/20 0120)   PRN Meds:.sodium chloride, ALPRAZolam, docusate sodium, morphine injection, nitroGLYCERIN, ondansetron (ZOFRAN) IV, polyethylene glycol   Time Spent in minutes  25   See all Orders from today for further details   Oren Binet M.D on 02/08/2020 at 3:25 PM  To page go to [email or web address] - use universal password  [facility] -  Office  [phone]    Objective:   Vitals:   02/08/20 0445  02/08/20 0607 02/08/20 0810 02/08/20 1156  BP: 124/69  122/61 109/79  Pulse: 60 60 60 60  Resp: 20 18 18 19   Temp: 97.8 F (36.6 C)  98 F (36.7 C) 97.7 F (36.5 C)  TempSrc: Axillary  Axillary Axillary  SpO2: 98% 97% 96% 92%  Weight:      Height:        Wt Readings from Last 3 Encounters:  02/08/20 77.5 kg  01/23/20 81.1 kg  01/15/20 81.1 kg     Intake/Output Summary (Last 24 hours) at 02/08/2020 1525 Last data filed at 02/08/2020 1100 Gross per 24 hour  Intake 480 ml  Output 1150 ml  Net -670 ml     Physical Exam Gen Exam:Alert awake-not in any distress HEENT:atraumatic, normocephalic Chest: B/L clear to auscultation anteriorly CVS:S1S2 regular Abdomen:soft non tender, non distended Extremities:no edema Neurology: Non focal Skin: no rash   Data Review:    CBC Recent Labs  Lab 02/04/20 0923 02/05/20 0832 02/06/20 0419 02/07/20 0659 02/08/20 0801  WBC 16.7* 20.8* 16.6* 20.7* 17.3*  HGB 15.9* 17.0* 16.7* 17.1* 16.3*  HCT 48.7* 49.8* 50.5* 50.8* 48.1*  PLT 247 298 280 271 263  MCV 94.9 93.8 93.0 93.9 92.1  MCH 31.0 32.0 30.8 31.6 31.2  MCHC 32.6 34.1 33.1 33.7 33.9  RDW 14.6 14.5 14.5 14.5 14.6    Chemistries  Recent Labs  Lab 02/04/20 0923 02/04/20 0923 02/05/20 0404 02/06/20 0419 02/07/20 0659 02/07/20 1719 02/08/20 0801  NA 130*   < > 131* 133* 130* 129* 130*  K 4.8   < > 5.1 5.3* 6.0* 5.2* 5.0  CL 88*   < > 90* 92* 89* 89* 89*  CO2 26   < > 26 28 26 24 27   GLUCOSE 336*   < > 150* 160* 142* 163* 124*  BUN 86*   < > 95* 101* 109* 118* 117*  CREATININE 2.21*   < > 2.10* 2.07* 2.19* 2.32* 2.28*  CALCIUM 9.7   < > 9.8 9.5 9.8 9.6 9.3  MG 2.3  --  2.5* 3.0* 2.8*  --  2.7*  AST 18  --  16 16 17   --  17  ALT 31  --  28 24 19   --  20  ALKPHOS 74  --  74 76 71  --  71  BILITOT 0.8  --  0.6 0.8 1.4*  --  1.1   < > = values in this interval not displayed.    ------------------------------------------------------------------------------------------------------------------ No results for input(s): CHOL, HDL, LDLCALC, TRIG, CHOLHDL, LDLDIRECT in the last 72 hours.  Lab Results  Component Value Date   HGBA1C 6.7 (H) 01/14/2020   ------------------------------------------------------------------------------------------------------------------ No results for input(s): TSH, T4TOTAL, T3FREE, THYROIDAB in the last 72 hours.  Invalid input(s): FREET3 ------------------------------------------------------------------------------------------------------------------ No results for input(s): VITAMINB12,  FOLATE, FERRITIN, TIBC, IRON, RETICCTPCT in the last 72 hours.  Coagulation profile No results for input(s): INR, PROTIME in the last 168 hours.  Recent Labs    02/07/20 0659 02/08/20 0801  DDIMER 1.40* 1.27*    Cardiac Enzymes No results for input(s): CKMB, TROPONINI, MYOGLOBIN in the last 168 hours.  Invalid input(s): CK ------------------------------------------------------------------------------------------------------------------    Component Value Date/Time   BNP 319.6 (H) 01/30/2020 1723   BNP 540.2 (H) 08/07/2016 1433    Micro Results Recent Results (from the past 240 hour(s))  Blood Culture (routine x 2)     Status: None   Collection Time: 01/30/20  5:23 PM   Specimen: BLOOD RIGHT HAND  Result Value Ref Range Status   Specimen Description BLOOD RIGHT HAND  Final   Special Requests   Final    BOTTLES DRAWN AEROBIC ONLY Blood Culture results may not be optimal due to an inadequate volume of blood received in culture bottles   Culture   Final    NO GROWTH 5 DAYS Performed at [facility], [place] [address] [place], [place] [address]    Report Status 02/04/2020 FINAL  Final  Blood Culture (routine x 2)     Status: None   Collection Time: 01/30/20  6:26 PM   Specimen: BLOOD RIGHT WRIST  Result Value Ref Range Status    Specimen Description BLOOD RIGHT WRIST  Final   Special Requests   Final    BOTTLES DRAWN AEROBIC ONLY Blood Culture results may not be optimal due to an inadequate volume of blood received in culture bottles   Culture   Final    NO GROWTH 5 DAYS Performed at [facility], [place] [address], [place], [place] [address]    Report Status 02/04/2020 FINAL  Final  MRSA PCR Screening     Status: Abnormal   Collection Time: 02/05/20  5:47 AM   Specimen: Nasal Mucosa; Nasopharyngeal  Result Value Ref Range Status   MRSA by PCR POSITIVE (A) NEGATIVE Final    Comment:        The GeneXpert MRSA Assay (FDA approved for NASAL specimens only), is one component of a comprehensive MRSA colonization surveillance program. It is not intended to diagnose MRSA infection nor to guide or monitor treatment for MRSA infections. RESULT CALLED TO, READ BACK BY AND VERIFIED WITH: LIGHTNER,N RN 02/05/2020 AT 8295 SKEEN,P Performed at [facility], [place] [address] [place],  [address]     Radiology Reports DG Chest 2 View  Result Date: 01/19/2020 CLINICAL DATA:  Fatigue.  History of CHF. EXAM: CHEST - 2 VIEW COMPARISON:  12/05/2019.  09/30/2019. FINDINGS: AICD in stable position. Again noted cardiomegaly with bilateral interstitial prominence suggesting CHF. Small bilateral pleural effusion may be present. Calcified left lung nodule consistent with granuloma again noted. No pneumothorax. Degenerative change thoracic spine. Lower thoracic spine compression fracture noted on today's exam. IMPRESSION: 1. AICD in stable position. Cardiomegaly with bilateral interstitial prominence again noted suggesting CHF. Similar findings noted on prior exam. Small bilateral pleural effusions may be present. 2. Lower thoracic vertebral body compression fracture may be present. Electronically Signed   By: Marcello Moores  Register   On: 01/19/2020 05:14   DG Chest 2 View  Result Date: 01/12/2020 CLINICAL DATA:  Chest pain.  EXAM: CHEST - 2 VIEW COMPARISON:  12/05/2019. FINDINGS: AICD in stable position. Again noted cardiomegaly with pulmonary venous congestion bilateral interstitial prominence and small bilateral pleural effusions. Findings consistent CHF. Similar  findings noted on prior exam. Calcified pulmonary nodules noted consistent prior granulomas disease. No pneumothorax. IMPRESSION: AICD in stable position. Cardiomegaly with pulmonary venous congestion, bilateral interstitial prominence, and small bilateral pleural effusions again noted. Findings again consistent with CHF. Similar findings noted on prior exam. Electronically Signed   By: Marcello Moores  Register   On: 01/12/2020 05:20   DG Wrist Complete Left  Result Date: 01/12/2020 CLINICAL DATA:  Persistent left wrist pain after fall 1 month ago EXAM: LEFT WRIST - COMPLETE 3+ VIEW COMPARISON:  12/26/2019 FINDINGS: No definite acute fracture is identified. Possible subtle horizontal lucency at the distal ulna could reflect a subacute nondisplaced fracture. No definite fracture line is seen in at the radial styloid. Mildly widened scapholunate interval of 3 mm. Similar degree of osteoarthritis, most pronounced at the first [facility] and triscaphe joints. Bones are demineralized. Soft tissue swelling persists. IMPRESSION: 1. Possible subtle subacute nondisplaced fracture at the distal ulna. Correlate with point tenderness. 2. No definite acute or healing distal radial fracture is identified. 3. Mildly widened scapholunate interval of 3 mm suggesting underlying ligamentous injury. Electronically Signed   By: Davina Poke D.O.   On: 01/12/2020 08:47   DG Chest Port 1 View  Result Date: 01/31/2020 CLINICAL DATA:  Hypoxia.  Reported COVID-19 positive EXAM: PORTABLE CHEST 1 VIEW COMPARISON:  January 30, 2020 and January 19, 2020 FINDINGS: Extensive patchy interstitial and airspace opacity bilaterally is essentially stable compared to 1 day prior with significant progression of disease  since 2 weeks prior. Mild consolidation in the left base is stable. There is cardiomegaly with pulmonary vascularity within normal limits. Pacemaker leads attached to right atrium, right ventricle, and coronary sinus. No adenopathy appreciable. There is aortic atherosclerosis. No bone lesions. IMPRESSION: Widespread interstitial and airspace opacity bilaterally, stable compared to 1 day prior. Mild consolidation left base. No new opacity evident. Stable cardiac prominence. Stable pacemaker lead positioning. No adenopathy appreciable. Aortic Atherosclerosis (ICD10-I70.0). Electronically Signed   By: Lowella Grip III M.D.   On: 01/31/2020 11:38   DG Chest Port 1 View  Result Date: 01/30/2020 CLINICAL DATA:  COVID-19 positive EXAM: PORTABLE CHEST 1 VIEW COMPARISON:  01/26/2020 chest radiograph. FINDINGS: Stable 3 lead left subclavian ICD. Stable cardiomediastinal silhouette with moderate cardiomegaly. No pneumothorax. No pleural effusion. Extensive patchy opacities throughout both lungs are new. IMPRESSION: New extensive patchy opacities throughout both lungs, differential includes COVID-19 pneumonia and/or cardiogenic pulmonary edema given moderate cardiomegaly. Electronically Signed   By: Ilona Sorrel M.D.   On: 01/30/2020 18:07   ECHOCARDIOGRAM LIMITED  Result Date: 01/31/2020    ECHOCARDIOGRAM LIMITED REPORT   Patient Name:   SULY VUKELICH Bjorn Date of Exam: 01/31/2020 Medical Rec #:  [id number]         Height:       65.0 in Accession #:    [id number]        Weight:       178.8 lb Date of Birth:  November 26, 1935         BSA:          1.886 m Patient Age:    28 years          BP:           133/63 mmHg Patient Gender: F                 HR:           59 bpm. Exam Location:  Inpatient Procedure: Intracardiac Opacification Agent, Limited Echo,  Limited Color Doppler            and Cardiac Doppler Indications:    Dyspnea 786.09 / R06.00  History:        Patient has prior history of Echocardiogram examinations, most                  recent 08/24/2019. CHF, Defibrillator, COPD, Pulmonary HTN and                 PAD, Arrythmias:LBBB; Risk Factors:Former Smoker, Hypertension                 and Dyslipidemia. Chronic hypoxemic respiratory failure                 secondary to COVID-19, sepsis, chronic kidney disease.  Sonographer:    Darlina Sicilian RDCS Referring Phys: [place]  1. Left ventricular ejection fraction, by estimation, is 25 to 30%. The left ventricle has severely decreased function. The left ventricle demonstrates global hypokinesis. Left ventricular diastolic parameters are consistent with Grade II diastolic dysfunction (pseudonormalization). Elevated left atrial pressure.  2. Right ventricular systolic function is normal. The right ventricular size is normal. There is normal pulmonary artery systolic pressure. The estimated right ventricular systolic pressure is 16.6 mmHg.  3. The mitral valve is normal in structure. Mild mitral valve regurgitation.  4. The aortic valve was not well visualized. Aortic valve regurgitation is not visualized. Mild to moderate aortic valve sclerosis/calcification is present, without any evidence of aortic stenosis.  5. The inferior vena cava is normal in size with greater than 50% respiratory variability, suggesting right atrial pressure of 3 mmHg. FINDINGS  Left Ventricle: Left ventricular ejection fraction, by estimation, is 25 to 30%. The left ventricle has severely decreased function. The left ventricle demonstrates global hypokinesis. Definity contrast agent was given IV to delineate the left ventricular endocardial borders. Elevated left atrial pressure. Right Ventricle: The right ventricular size is normal. Right ventricular systolic function is normal. There is normal pulmonary artery systolic pressure. The tricuspid regurgitant velocity is 2.53 m/s, and with an assumed right atrial pressure of 3 mmHg,  the estimated right ventricular systolic pressure is 06.3  mmHg. Pericardium: There is no evidence of pericardial effusion. Mitral Valve: The mitral valve is normal in structure. Mild mitral valve regurgitation. Tricuspid Valve: The tricuspid valve is normal in structure. Tricuspid valve regurgitation is trivial. Aortic Valve: The aortic valve was not well visualized. Aortic valve regurgitation is not visualized. Mild to moderate aortic valve sclerosis/calcification is present, without any evidence of aortic stenosis. Venous: The inferior vena cava is normal in size with greater than 50% respiratory variability, suggesting right atrial pressure of 3 mmHg. LEFT VENTRICLE PLAX 2D LVIDd:         5.70 cm      Diastology LVIDs:         5.00 cm      LV e' lateral:   2.47 cm/s LV PW:         0.90 cm      LV E/e' lateral: 32.1 LV IVS:        1.10 cm      LV e' medial:    2.40 cm/s LVOT diam:     1.90 cm      LV E/e' medial:  33.0 LV SV:         44 LV SV Index:   23 LVOT Area:     2.84 cm  LV Volumes (MOD)  LV vol d, MOD A2C: 146.0 ml LV vol d, MOD A4C: 185.5 ml LV vol s, MOD A2C: 115.0 ml LV vol s, MOD A4C: 117.5 ml LV SV MOD A2C:     31.0 ml LV SV MOD A4C:     185.5 ml LV SV MOD BP:      47.9 ml LEFT ATRIUM         Index LA diam:    3.30 cm 1.75 cm/m  AORTIC VALVE LVOT Vmax:   67.20 cm/s LVOT Vmean:  47.900 cm/s LVOT VTI:    0.156 m MITRAL VALVE                 TRICUSPID VALVE MV Area (PHT): 3.65 cm      TR Peak grad:   25.6 mmHg MV Decel Time: 208 msec      TR Vmax:        253.00 cm/s MR Peak grad:    123.7 mmHg MR Mean grad:    64.0 mmHg   SHUNTS MR Vmax:         556.00 cm/s Systemic VTI:  0.16 m MR Vmean:        367.0 cm/s  Systemic Diam: 1.90 cm MR PISA:         1.01 cm MR PISA Eff ROA: 7 mm MR PISA Radius:  0.40 cm MV E velocity: 79.30 cm/s MV A velocity: 91.30 cm/s MV E/A ratio:  0.87 Oswaldo Milian MD Electronically signed by Oswaldo Milian MD Signature Date/Time: 01/31/2020/3:57:07 PM    Final

## 2020-02-08 NOTE — Progress Notes (Signed)
Physical Therapy Treatment Patient Details Name: Sydney Phillips MRN: [id number] DOB: 05/08/1936 Today's Date: 02/08/2020    History of Present Illness Pt is a 84 y.o. female with recent hospitalization for COVID-19 from 01/19/20-01/23/20 (d/c with HHPT), now readmitted 01/30/20 with confusion, dyspnea and weakness; workup for acute hypoxic respiratory failure due to worsening COVID PNA. PMH includes COPD, CAD, CKD, DM, HTN, obesity, ICD, recent L wrist fx (unsure exact date - ortho consult 7/29 recommends WBAT thru elbow only; imaging 7/12 shows radial styloid fx), multiple recent admissions.   PT Comments    Pt not progressing with mobility. Significant increased WOB at rest with SpO2 93% on 25 L O2 HHFNC at 80% FiO2 + NRB, but quick to desaturate when NRB removed. Pt requiring totalA for all aspects of bed mobility and repositioning to encourage pressure relief. Will lift arms, hold cup to drink, answer a few questions and follow <50% simple commands, other than that, kept eyes closed majority of session saying "it's too hot." Attempted to help make more comfortable. Pt declined phone call with son. Will follow acutely as pt able to participate.   Follow Up Recommendations  SNF     Equipment Recommendations   (defer)    Recommendations for Other Services       Precautions / Restrictions Precautions Precautions: Fall Splint/Cast: L wrist ace wrap -- removed 8/25 to check skin integrity Restrictions Weight Bearing Restrictions: Yes Other Position/Activity Restrictions: Unsure exact L wrist fx date - imaging from 12/26/19 with radial styloid fx; ortho consult from admission 01/12/20 recommends L wrist NWB with WBAT only through elbow    Mobility  Bed Mobility Overal bed mobility: Needs Assistance             General bed mobility comments: TotalA for all aspects of bed mobility and repositioning  Transfers                 General transfer comment: pt adamantly declined,  will require +2 assist or lift equipment  Ambulation/Gait                 Stairs             Wheelchair Mobility    Modified Rankin (Stroke Patients Only)       Balance                                            Cognition Arousal/Alertness: Lethargic Behavior During Therapy: Flat affect Overall Cognitive Status: No family/caregiver present to determine baseline cognitive functioning                                 General Comments: Per chart, baseline dementia. Pt following some simple commands with increased time. Lethargic with eyes closed majority of session. Significant WOB. Continues to repeat, "It's too hot in here." Able to answer some basic questions, not answering son's name or other orientation questions beyond her name      Exercises Other Exercises Other Exercises: Repositioned to encourage BUE elevation, bilateral knee extension, heels floated for pressure relief Other Exercises: Static cervical flexion holds (~5-sec), static shoulder/elbow flex holds; pt with little active LE movement to command    General Comments General comments (skin integrity, edema, etc.): SpO2 93% on 25L O2 HHFNC at 80% FiO2 + NRB. SpO2  droping <88% when NRB removed to talk or take sips of water. BP 93/61 via R lower leg, HR 55-60s. Significant increased WOB at rest      Pertinent Vitals/Pain Pain Assessment: Faces Faces Pain Scale: Hurts a little bit Pain Location: Generalized, unable to specify Pain Descriptors / Indicators: Grimacing Pain Intervention(s): Monitored during session;Repositioned    Home Living                      Prior Function            PT Goals (current goals can now be found in the care plan section) Progress towards PT goals: Not progressing toward goals - comment (lethargy, O2 needs/WOB)    Frequency    Min 2X/week      PT Plan Current plan remains appropriate    Co-evaluation               AM-PAC PT "6 Clicks" Mobility   Outcome Measure  Help needed turning from your back to your side while in a flat bed without using bedrails?: Total Help needed moving from lying on your back to sitting on the side of a flat bed without using bedrails?: Total Help needed moving to and from a bed to a chair (including a wheelchair)?: Total Help needed standing up from a chair using your arms (e.g., wheelchair or bedside chair)?: Total Help needed to walk in hospital room?: Total Help needed climbing 3-5 steps with a railing? : Total 6 Click Score: 6    End of Session Equipment Utilized During Treatment: Oxygen Activity Tolerance: Patient limited by fatigue;Patient limited by lethargy Patient left: in bed;with call bell/phone within reach;with bed alarm set Nurse Communication: Mobility status;Need for lift equipment PT Visit Diagnosis: Other abnormalities of gait and mobility (R26.89);Muscle weakness (generalized) (M62.81);History of falling (Z91.81)     Time: 6468-0321 PT Time Calculation (min) (ACUTE ONLY): 31 min  Charges:  $Therapeutic Exercise: 8-22 mins $Therapeutic Activity: 8-22 mins                     Mabeline Caras, PT, DPT Acute Rehabilitation Services  Pager [phone] Office [place] 02/08/2020, 5:31 PM

## 2020-02-09 LAB — D-DIMER, QUANTITATIVE: D-Dimer, Quant: 1.27 ug/mL-FEU — ABNORMAL HIGH (ref 0.00–0.50)

## 2020-02-09 LAB — CBC
HCT: 48.4 % — ABNORMAL HIGH (ref 36.0–46.0)
Hemoglobin: 16.4 g/dL — ABNORMAL HIGH (ref 12.0–15.0)
MCH: 31.8 pg (ref 26.0–34.0)
MCHC: 33.9 g/dL (ref 30.0–36.0)
MCV: 94 fL (ref 80.0–100.0)
Platelets: 262 10*3/uL (ref 150–400)
RBC: 5.15 MIL/uL — ABNORMAL HIGH (ref 3.87–5.11)
RDW: 14.7 % (ref 11.5–15.5)
WBC: 23.1 10*3/uL — ABNORMAL HIGH (ref 4.0–10.5)
nRBC: 0 % (ref 0.0–0.2)

## 2020-02-09 LAB — COMPREHENSIVE METABOLIC PANEL
ALT: UNDETERMINED U/L (ref 0–44)
AST: 29 U/L (ref 15–41)
Albumin: 2.4 g/dL — ABNORMAL LOW (ref 3.5–5.0)
Alkaline Phosphatase: 72 U/L (ref 38–126)
Anion gap: 18 — ABNORMAL HIGH (ref 5–15)
BUN: 115 mg/dL — ABNORMAL HIGH (ref 8–23)
CO2: 19 mmol/L — ABNORMAL LOW (ref 22–32)
Calcium: 9.2 mg/dL (ref 8.9–10.3)
Chloride: 91 mmol/L — ABNORMAL LOW (ref 98–111)
Creatinine, Ser: 2.34 mg/dL — ABNORMAL HIGH (ref 0.44–1.00)
GFR calc Af Amer: 22 mL/min — ABNORMAL LOW (ref >60)
GFR calc non Af Amer: 19 mL/min — ABNORMAL LOW (ref >60)
Glucose, Bld: 158 mg/dL — ABNORMAL HIGH (ref 70–99)
Potassium: 5 mmol/L (ref 3.5–5.1)
Sodium: 128 mmol/L — ABNORMAL LOW (ref 135–145)
Total Bilirubin: UNDETERMINED mg/dL (ref 0.3–1.2)
Total Protein: 5.5 g/dL — ABNORMAL LOW (ref 6.5–8.1)

## 2020-02-09 LAB — GLUCOSE, CAPILLARY
Glucose-Capillary: 122 mg/dL — ABNORMAL HIGH (ref 70–99)
Glucose-Capillary: 194 mg/dL — ABNORMAL HIGH (ref 70–99)
Glucose-Capillary: 236 mg/dL — ABNORMAL HIGH (ref 70–99)
Glucose-Capillary: 143 mg/dL — ABNORMAL HIGH (ref 70–99)

## 2020-02-09 LAB — C-REACTIVE PROTEIN: CRP: 1.3 mg/dL — ABNORMAL HIGH (ref <1.0)

## 2020-02-09 LAB — MAGNESIUM: Magnesium: 2.6 mg/dL — ABNORMAL HIGH (ref 1.7–2.4)

## 2020-02-09 NOTE — Progress Notes (Signed)
Walked by patient's room and noted HHFNC slipped out of nose and NRB off, pt saturating at 84%; [place] and NRB replaced. Pt repositioned to encourage more upright. Pt reports, "That's all you can do."  Mabeline Caras, PT, DPT Acute Rehabilitation Services  Pager [phone] Office [phone]

## 2020-02-09 NOTE — Progress Notes (Signed)
Occupational Therapy Treatment Patient Details Name: Sydney Phillips MRN: [id number] DOB: 03/04/1936 Today's Date: 02/09/2020    History of present illness Pt is a 84 y.o. female with recent hospitalization for COVID-19 from 01/19/20-01/23/20 (d/c with HHPT), now readmitted 01/30/20 with confusion, dyspnea and weakness; workup for acute hypoxic respiratory failure due to worsening COVID PNA. PMH includes COPD, CAD, CKD, DM, HTN, obesity, ICD, recent L wrist fx (unsure exact date - ortho consult 7/29 recommends WBAT thru elbow only; imaging 7/12 shows radial styloid fx), multiple recent admissions.   OT comments  Pt seen for OT follow up session with focus on OOB mobility progression and staff education. RN staff approached OT about assisting pt out of bed and safest means to do so. NT staff educated on how to utilize maxinmove and safe pt handling. Maximove then used with NT staff present to lift pt out of bed to chair. Pt able to roll with max A. Noted limited engagement from pt this date due to poor cognition. Pt with difficulty attending to task and current moment without multimodal cues. Pt continues to remove HHFNC despite instruction. D/c recs remain appropriate, will continue to follow.  Vitals: Pt required 25L of SpO2 via HHFNC and NRB to maintain SpO2 >88% with bed mobility and lift to chair.    Follow Up Recommendations  SNF;Supervision/Assistance - 24 hour    Equipment Recommendations  Wheelchair (measurements OT);Wheelchair cushion (measurements OT)    Recommendations for Other Services      Precautions / Restrictions Precautions Precautions: Fall Precaution Comments: NWB L UE, WB through elbow Splint/Cast: L wrist ace wrap -- removed 8/25 to check skin integrity Restrictions Weight Bearing Restrictions: Yes LUE Weight Bearing: Weight bear through elbow only Other Position/Activity Restrictions: Unsure exact L wrist fx date - imaging from 12/26/19 with radial styloid fx; ortho  consult from admission 01/12/20 recommends L wrist NWB with WBAT only through elbow       Mobility Bed Mobility   Bed Mobility: Rolling Rolling: Max assist         General bed mobility comments: max A to roll to place lift place with increased cueing for hand placement to assist with pulling on rails  Transfers Overall transfer level: Needs assistance Equipment used: Ambulation equipment used       Squat pivot transfers: Total assist     General transfer comment: maximove used to lift pt to recliner    Balance                                           ADL either performed or assessed with clinical judgement   ADL Overall ADL's : Needs assistance/impaired                                       General ADL Comments: session focused on OOB mobility progression with use of mechanical lift. pt with decreased awareness and mental status to fully engage in BADL. Continues to remove HHFNC despite instruction     Vision Baseline Vision/History: Wears glasses Wears Glasses: At all times Patient Visual Report: No change from baseline     Perception     Praxis      Cognition Arousal/Alertness: Awake/alert Behavior During Therapy: Flat affect Overall Cognitive Status: No family/caregiver present to determine baseline  cognitive functioning Area of Impairment: Orientation;Safety/judgement;Problem solving;Following commands;Memory;Awareness;Attention                 Orientation Level: Disoriented to;Time Current Attention Level: Focused Memory: Decreased short-term memory Following Commands: Follows one step commands with increased time Safety/Judgement: Decreased awareness of safety;Decreased awareness of deficits Awareness: Intellectual Problem Solving: Slow processing;Requires verbal cues;Requires tactile cues;Difficulty sequencing General Comments: pt with limited awareness and ability to attend to basic tasks. Needs multimodal  cueing to allow self to presently focus on task.Very minimal engagement. Per RN staff pt was asking to get OOB        Exercises     Shoulder Instructions       General Comments      Pertinent Vitals/ Pain       Pain Assessment: No/denies pain  Home Living                                          Prior Functioning/Environment              Frequency  Min 2X/week        Progress Toward Goals  OT Goals(current goals can now be found in the care plan section)  Progress towards OT goals: Progressing toward goals  Acute Rehab OT Goals Patient Stated Goal: go home OT Goal Formulation: With patient Time For Goal Achievement: 02/17/20 Potential to Achieve Goals: [place] Discharge plan remains appropriate    Co-evaluation                 AM-PAC OT "6 Clicks" Daily Activity     Outcome Measure   Help from another person eating meals?: A Little Help from another person taking care of personal grooming?: A Little Help from another person toileting, which includes using toliet, bedpan, or urinal?: A Lot Help from another person bathing (including washing, rinsing, drying)?: A Lot Help from another person to put on and taking off regular upper body clothing?: A Little Help from another person to put on and taking off regular lower body clothing?: A Lot 6 Click Score: 15    End of Session Equipment Utilized During Treatment: Gait belt;Oxygen  OT Visit Diagnosis: Unsteadiness on feet (R26.81);Repeated falls (R29.6);History of falling (Z91.81);Muscle weakness (generalized) (M62.81);Other symptoms and signs involving cognitive function   Activity Tolerance Patient tolerated treatment well   Patient Left in chair;with call bell/phone within reach   Nurse Communication Mobility status        Time: 8841-6606 OT Time Calculation (min): 29 min  Charges: OT General Charges $OT Visit: 1 Visit OT Treatments $Therapeutic Activity: 23-37  mins  Zenovia Jarred, MSOT, OTR/L [facility] Office Number: [phone] Pager: [phone]  Zenovia Jarred 02/09/2020, 6:17 PM

## 2020-02-09 NOTE — Progress Notes (Signed)
PROGRESS NOTE                                                                                                                                                                                                             Patient Demographics:    Sydney Phillips, is a 84 y.o. female, DOB - 1936/01/12, EUM:[id number]  Outpatient Primary MD for the patient is Sydney Morale, MD   Admit date - 01/30/2020   LOS - 10  Chief Complaint  Patient presents with  . Altered Mental Status    covid +       Brief Narrative: Patient is a 84 y.o. female with PMHx of chronic systolic heart failure s/p CRT, ventricular tachycardia, COPD on home O2-mostly at night, HTN, DM-2, CKD stage IIIb-recently hospitalized from 8/4-8/9 for hypoxia from COVID-19-admitted by PCCM to the ICU for profound hypoxemia requiring heated high flow/NRB due to ongoing Covid pneumonitis with progressive fibrotic changes.  See below for further details.  Vaccination status: Unvaccinated  Significant Events: 7/29-8/1>> admit to [facility] by cardiology for decompensated heart failure 8/5>> COVID-19 positive 8/4-8/9>> hospitalized for hypoxia due to COVID-19 pneumonia-discharged on home O2 8/16>> readmitted to ICU for severe hypoxemia 8/20>> transfer to [facility]  Significant studies: 01/31/2020>> Echo: EF 54-00%, grade 2 diastolic dysfunction 8/67/6195>> chest x-ray: Widespread interstitial/airspace opacity bilaterally. 01/30/2020>> chest x-ray: New extensive patchy opacities throughout both lungs. 01/19/2020>>Chest x-ray: Cardiomegaly with bilateral interstitial prominence. 08/24/2019>> echo: EF 20-25%  COVID-19 medications: Remdesivir: 8/5>> 8/9 Steroids: 8/16>>  Antibiotics: Cefepime: 8/16>> 8/22 Vancomycin: 8/16>> 8/18  Microbiology data: 8/16>> blood culture: No growth 8/5 >>blood culture: No growth  Procedures: None  Consults: PCCM  DVT  prophylaxis: enoxaparin (LOVENOX) injection 30 mg Start: 01/31/20 1400 SCDs Start: 01/31/20 0926    Subjective:   Remains unchanged-still on heated high flow-at times will require NRB.  Frustrated that she is still in the hospital.  Asking when she can leave.  At times she can be difficult-yesterday she was claiming that "we were doing nothing for her".   Assessment  & Plan :   Acute on chronic hypoxic hypoxic Resp Failure (on home O2 2 L nocturnally) due to Covid 19 Viral pneumonia with progressive fibrotic changes (organizing pneumonia) superimposed on HCAP: Remains the same-no improvement over the past several days-on heated high flow-at times requires NRB.  On  steroid taper-amiodarone discontinued on 8/21.    Her overall prognosis is very poor-she is already a DNR-plans are to watch closely and hope for clinical improvement-however if in the meantime she were to deteriorate significantly-apart from transitioning to comfort measures-we really do not have any other options (patient-son (Sydney Phillips) aware)  Fever: afebrile O2 requirements:  SpO2: 95 % O2 Flow Rate (L/min): 25 L/min FiO2 (%): 80 %   COVID-19 Labs: Recent Labs    02/07/20 0659 02/08/20 0801 02/09/20 0156  DDIMER 1.40* 1.27* 1.27*  CRP <0.5 1.2* 1.3*       Component Value Date/Time   BNP 319.6 (H) 01/30/2020 1723   BNP 540.2 (H) 08/07/2016 1433    No results for input(s): PROCALCITON in the last 168 hours.  Lab Results  Component Value Date   SARSCOV2NAA POSITIVE (A) 01/19/2020   [place] NEGATIVE 01/12/2020   [place] NEGATIVE 12/11/2019   [place] NEGATIVE 12/05/2019     Prone/Incentive Spirometry: encouraged incentive spirometry use 3-4/hour.  Hyperkalemia: Resolved-however potassium still on the higher side-continue Lokelma.  Given insulin/D50 on 8/24.    Chest pain: Occurred on 8/23 evening-troponins negative.  EKG without any major changes.  Has known CAD.  However could have been noncardiac  pain-and GI in origin.  Change PPI to twice daily-continue with aspirin/Plavix/statin/beta-blocker.  Per my discussion with Sydney Phillips on 8/23-given acute illness-severe hypoxemia-really not a candidate for any intervention at this point.  CAD: No anginal symptoms-continue aspirin/Plavix/beta-blocker follow.  Last [facility] May 2021 showed stable anatomy.  Chronic systolic heart failure: Volume status is stable-we will hold Demadex given uptrending creatinine.  Watch creatinine closely.  AKI on CKD stage IIIb: Creatinine continues to trend up-hold Demadex for 24 hours-follow electrolytes.  PAD: History of subclavian stenting in 2013-stable.  History of ventricular tachycardia-s/p ICD shocks prompting her last hospitalization: Continue telemetry monitoring- amiodarone discontinued due to concern that it may be exacerbating lung fibrosis.  Case was discussed with cardiologist-Sydney Phillips on 8/21.  Recommendations were to monitor on telemetry-she has an ICD in place.  Potassium/magnesium stable.  DM-2 (A1c 6.7 on 7/31) with uncontrolled hyperglycemia secondary to steroids: CBGs remain on the higher side-continue Lantus 18 units and SSI-follow and adjust.   Recent Labs    02/08/20 2113 02/09/20 0747 02/09/20 1210  GLUCAP 258* 122* 194*    Depression/anxiety: Appears stable-continue Cymbalta and Xanax.  Nondisplaced left wrist fracture: Continue with splint-WABT through elbow.  Follow-up with Sydney Phillips in 2 weeks.  Was evaluated by orthopedics on 7/29.  Debility/deconditioning: Secondary to acute illness-evaluated by PT with recommendations for home health on discharge.  Palliative care discussion: DNR in place.  Have discussed with patient's son Sydney Phillips multiple times over the past few days-he is aware of patient's very poor overall prognosis-and the potential that she could deteriorate further.  She is already with severe hypoxia.  He is aware that if she were to deteriorate any further-apart  from transitioning to hospice care we really do not have any other options.  However she has been stable-comfortable-over the past few days-plans are to continue supportive care steroids and follow clinical course.  ABG:    Component Value Date/Time   PHART 7.432 12/05/2019 1343   PCO2ART 53.9 (H) 12/05/2019 1343   PO2ART 94 12/05/2019 1343   HCO3 33.0 (H) 01/30/2020 1726   TCO2 34 (H) 01/30/2020 1726   ACIDBASEDEF 6.0 (H) 04/06/2016 1242   O2SAT 51.0 01/30/2020 1726    Vent Settings: N/A FiO2 (%):  [80 %-100 %]  80 %  Condition - Stable  Family Communication  : Son-Sydney Phillips [phone]) updated on 8/25-he is aware that I will now call him on 8/27 unless things are dramatically different on 8/26.  Code Status :  Full Code  Diet :  Diet Order            Diet regular Room service appropriate? Yes; Fluid consistency: Thin  Diet effective now                  Disposition Plan  :   Status is: Inpatient  Remains inpatient appropriate because:Inpatient level of care appropriate due to severity of illness  Dispo: The patient is from: Home              Anticipated d/c is to: Home              Anticipated d/c date is:>3 days              Patient currently is not medically stable to d/c.   Barriers to discharge: Hypoxia requiring O2 supplementation  Antimicorbials  :    Anti-infectives (From admission, onward)   Start     Dose/Rate Route Frequency Ordered Stop   01/31/20 2100  vancomycin (VANCOCIN) IVPB 1000 mg/200 mL premix  Status:  Discontinued        1,000 mg 200 mL/hr over 60 Minutes Intravenous Every 24 hours 01/30/20 1931 02/02/20 0928   01/31/20 2000  ceFEPIme (MAXIPIME) 2 g in sodium chloride 0.9 % 100 mL IVPB  Status:  Discontinued        2 g 200 mL/hr over 30 Minutes Intravenous Every 24 hours 01/30/20 1931 02/06/20 1224   01/30/20 1830  vancomycin (VANCOREADY) IVPB 1500 mg/300 mL        1,500 mg 150 mL/hr over 120 Minutes Intravenous  Once 01/30/20 1823 01/30/20  2350   01/30/20 1830  ceFEPIme (MAXIPIME) 2 g in sodium chloride 0.9 % 100 mL IVPB        2 g 200 mL/hr over 30 Minutes Intravenous  Once 01/30/20 1823 01/30/20 1955      Inpatient Medications  Scheduled Meds: . allopurinol  200 mg Oral Daily  . vitamin C  250 mg Oral Daily  . aspirin  81 mg Oral Daily  . bisoprolol  5 mg Oral Daily  . Chlorhexidine Gluconate Cloth  6 each Topical Q0600  . cholecalciferol  4,000 Units Oral Daily  . clopidogrel  75 mg Oral Daily  . DULoxetine  60 mg Oral BID  . enoxaparin (LOVENOX) injection  30 mg Subcutaneous Q24H  . insulin aspart  0-15 Units Subcutaneous TID WC  . insulin glargine  18 Units Subcutaneous Daily  . Ipratropium-Albuterol  1 puff Inhalation TID  . isosorbide mononitrate  30 mg Oral Daily  . loratadine  10 mg Oral Daily  . mouth rinse  15 mL Mouth Rinse BID  . mupirocin ointment  1 application Nasal BID  . pantoprazole  40 mg Oral BID  . predniSONE  50 mg Oral Q breakfast   Followed by  . [START ON 02/12/2020] predniSONE  40 mg Oral Q breakfast   Followed by  . [START ON 02/19/2020] predniSONE  30 mg Oral Q breakfast   Followed by  . [START ON 02/26/2020] predniSONE  20 mg Oral Q breakfast   Followed by  . [START ON 03/04/2020] predniSONE  10 mg Oral Q breakfast   Followed by  . [START ON 03/11/2020] predniSONE  5 mg Oral Q breakfast  . sodium zirconium cyclosilicate  10 g Oral Daily  . torsemide  80 mg Oral Daily  . vitamin B-12  2,000 mcg Oral Daily   Continuous Infusions: . sodium chloride Stopped (02/05/20 0120)   PRN Meds:.sodium chloride, ALPRAZolam, docusate sodium, morphine injection, nitroGLYCERIN, ondansetron (ZOFRAN) IV, polyethylene glycol   Time Spent in minutes  25   See all Orders from today for further details   Oren Binet M.D on 02/09/2020 at 2:51 PM  To page go to [email or web address] - use universal password  [facility] -  Office  [phone]    Objective:   Vitals:   02/09/20 0400  02/09/20 0417 02/09/20 0743 02/09/20 1206  BP: 128/72  (!) 138/52 (!) 146/62  Pulse: 60  60 64  Resp: 20  19 20   Temp: 97.6 F (36.4 C)  97.8 F (36.6 C) 98.3 F (36.8 C)  TempSrc: Axillary  Oral Oral  SpO2: 92%  100% 95%  Weight:  76.7 kg    Height:        Wt Readings from Last 3 Encounters:  02/09/20 76.7 kg  01/23/20 81.1 kg  01/15/20 81.1 kg     Intake/Output Summary (Last 24 hours) at 02/09/2020 1451 Last data filed at 02/09/2020 1300 Gross per 24 hour  Intake 420 ml  Output --  Net 420 ml     Physical Exam Gen Exam:Alert awake-not in any distress HEENT:atraumatic, normocephalic Chest: B/L clear to auscultation anteriorly CVS:S1S2 regular Abdomen:soft non tender, non distended Extremities:no edema Neurology: Non focal Skin: no rash   Data Review:    CBC Recent Labs  Lab 02/05/20 0832 02/06/20 0419 02/07/20 0659 02/08/20 0801 02/09/20 1050  WBC 20.8* 16.6* 20.7* 17.3* 23.1*  HGB 17.0* 16.7* 17.1* 16.3* 16.4*  HCT 49.8* 50.5* 50.8* 48.1* 48.4*  PLT 298 280 271 263 262  MCV 93.8 93.0 93.9 92.1 94.0  MCH 32.0 30.8 31.6 31.2 31.8  MCHC 34.1 33.1 33.7 33.9 33.9  RDW 14.5 14.5 14.5 14.6 14.7    Chemistries  Recent Labs  Lab 02/05/20 0404 02/05/20 0404 02/06/20 0419 02/07/20 0659 02/07/20 1719 02/08/20 0801 02/09/20 0156 02/09/20 1050  NA 131*   < > 133* 130* 129* 130*  --  128*  K 5.1   < > 5.3* 6.0* 5.2* 5.0  --  5.0  CL 90*   < > 92* 89* 89* 89*  --  91*  CO2 26   < > 28 26 24 27   --  19*  GLUCOSE 150*   < > 160* 142* 163* 124*  --  158*  BUN 95*   < > 101* 109* 118* 117*  --  115*  CREATININE 2.10*   < > 2.07* 2.19* 2.32* 2.28*  --  2.34*  CALCIUM 9.8   < > 9.5 9.8 9.6 9.3  --  9.2  MG 2.5*  --  3.0* 2.8*  --  2.7* 2.6*  --   AST 16  --  16 17  --  17  --  29  ALT 28  --  24 19  --  20  --  QUANTITY NOT SUFFICIENT, UNABLE TO PERFORM TEST  ALKPHOS 74  --  76 71  --  71  --  72  BILITOT 0.6  --  0.8 1.4*  --  1.1  --  QUANTITY NOT  SUFFICIENT, UNABLE TO PERFORM TEST   < > = values in this interval not displayed.   ------------------------------------------------------------------------------------------------------------------  No results for input(s): CHOL, HDL, LDLCALC, TRIG, CHOLHDL, LDLDIRECT in the last 72 hours.  Lab Results  Component Value Date   HGBA1C 6.7 (H) 01/14/2020   ------------------------------------------------------------------------------------------------------------------ No results for input(s): TSH, T4TOTAL, T3FREE, THYROIDAB in the last 72 hours.  Invalid input(s): FREET3 ------------------------------------------------------------------------------------------------------------------ No results for input(s): VITAMINB12, FOLATE, FERRITIN, TIBC, IRON, RETICCTPCT in the last 72 hours.  Coagulation profile No results for input(s): INR, PROTIME in the last 168 hours.  Recent Labs    02/08/20 0801 02/09/20 0156  DDIMER 1.27* 1.27*    Cardiac Enzymes No results for input(s): CKMB, TROPONINI, MYOGLOBIN in the last 168 hours.  Invalid input(s): CK ------------------------------------------------------------------------------------------------------------------    Component Value Date/Time   BNP 319.6 (H) 01/30/2020 1723   BNP 540.2 (H) 08/07/2016 1433    Micro Results Recent Results (from the past 240 hour(s))  Blood Culture (routine x 2)     Status: None   Collection Time: 01/30/20  5:23 PM   Specimen: BLOOD RIGHT HAND  Result Value Ref Range Status   Specimen Description BLOOD RIGHT HAND  Final   Special Requests   Final    BOTTLES DRAWN AEROBIC ONLY Blood Culture results may not be optimal due to an inadequate volume of blood received in culture bottles   Culture   Final    NO GROWTH 5 DAYS Performed at [facility], [place] [address] [place], [place] [address]    Report Status 02/04/2020 FINAL  Final  Blood Culture (routine x 2)     Status: None   Collection Time:  01/30/20  6:26 PM   Specimen: BLOOD RIGHT WRIST  Result Value Ref Range Status   Specimen Description BLOOD RIGHT WRIST  Final   Special Requests   Final    BOTTLES DRAWN AEROBIC ONLY Blood Culture results may not be optimal due to an inadequate volume of blood received in culture bottles   Culture   Final    NO GROWTH 5 DAYS Performed at [facility], [place] [address] [place], [place] [address]    Report Status 02/04/2020 FINAL  Final  MRSA PCR Screening     Status: Abnormal   Collection Time: 02/05/20  5:47 AM   Specimen: Nasal Mucosa; Nasopharyngeal  Result Value Ref Range Status   MRSA by PCR POSITIVE (A) NEGATIVE Final    Comment:        The GeneXpert MRSA Assay (FDA approved for NASAL specimens only), is one component of a comprehensive MRSA colonization surveillance program. It is not intended to diagnose MRSA infection nor to guide or monitor treatment for MRSA infections. RESULT CALLED TO, READ BACK BY AND VERIFIED WITH: LIGHTNER,N RN 02/05/2020 AT 5009 SKEEN,P Performed at [facility], [place] [address], [place], [place] [address]     Radiology Reports DG Chest 2 View  Result Date: 01/19/2020 CLINICAL DATA:  Fatigue.  History of CHF. EXAM: CHEST - 2 VIEW COMPARISON:  12/05/2019.  09/30/2019. FINDINGS: AICD in stable position. Again noted cardiomegaly with bilateral interstitial prominence suggesting CHF. Small bilateral pleural effusion may be present. Calcified left lung nodule consistent with granuloma again noted. No pneumothorax. Degenerative change thoracic spine. Lower thoracic spine compression fracture noted on today's exam. IMPRESSION: 1. AICD in stable position. Cardiomegaly with bilateral interstitial prominence again noted suggesting CHF. Similar findings noted on prior exam. Small bilateral pleural effusions may be present. 2. Lower thoracic vertebral body compression fracture may be present. Electronically Signed   By: Marcello Moores  Register   On:  01/19/2020 05:14   DG Chest 2 View  Result Date: 01/12/2020 CLINICAL DATA:  Chest pain. EXAM: CHEST - 2 VIEW COMPARISON:  12/05/2019. FINDINGS: AICD in stable position. Again noted cardiomegaly with pulmonary venous congestion bilateral interstitial prominence and small bilateral pleural effusions. Findings consistent CHF. Similar findings noted on prior exam. Calcified pulmonary nodules noted consistent prior granulomas disease. No pneumothorax. IMPRESSION: AICD in stable position. Cardiomegaly with pulmonary venous congestion, bilateral interstitial prominence, and small bilateral pleural effusions again noted. Findings again consistent with CHF. Similar findings noted on prior exam. Electronically Signed   By: Marcello Moores  Register   On: 01/12/2020 05:20   DG Wrist Complete Left  Result Date: 01/12/2020 CLINICAL DATA:  Persistent left wrist pain after fall 1 month ago EXAM: LEFT WRIST - COMPLETE 3+ VIEW COMPARISON:  12/26/2019 FINDINGS: No definite acute fracture is identified. Possible subtle horizontal lucency at the distal ulna could reflect a subacute nondisplaced fracture. No definite fracture line is seen in at the radial styloid. Mildly widened scapholunate interval of 3 mm. Similar degree of osteoarthritis, most pronounced at the first [facility] and triscaphe joints. Bones are demineralized. Soft tissue swelling persists. IMPRESSION: 1. Possible subtle subacute nondisplaced fracture at the distal ulna. Correlate with point tenderness. 2. No definite acute or healing distal radial fracture is identified. 3. Mildly widened scapholunate interval of 3 mm suggesting underlying ligamentous injury. Electronically Signed   By: Davina Poke D.O.   On: 01/12/2020 08:47   DG Chest Port 1 View  Result Date: 01/31/2020 CLINICAL DATA:  Hypoxia.  Reported COVID-19 positive EXAM: PORTABLE CHEST 1 VIEW COMPARISON:  January 30, 2020 and January 19, 2020 FINDINGS: Extensive patchy interstitial and airspace opacity  bilaterally is essentially stable compared to 1 day prior with significant progression of disease since 2 weeks prior. Mild consolidation in the left base is stable. There is cardiomegaly with pulmonary vascularity within normal limits. Pacemaker leads attached to right atrium, right ventricle, and coronary sinus. No adenopathy appreciable. There is aortic atherosclerosis. No bone lesions. IMPRESSION: Widespread interstitial and airspace opacity bilaterally, stable compared to 1 day prior. Mild consolidation left base. No new opacity evident. Stable cardiac prominence. Stable pacemaker lead positioning. No adenopathy appreciable. Aortic Atherosclerosis (ICD10-I70.0). Electronically Signed   By: Lowella Grip III M.D.   On: 01/31/2020 11:38   DG Chest Port 1 View  Result Date: 01/30/2020 CLINICAL DATA:  COVID-19 positive EXAM: PORTABLE CHEST 1 VIEW COMPARISON:  01/26/2020 chest radiograph. FINDINGS: Stable 3 lead left subclavian ICD. Stable cardiomediastinal silhouette with moderate cardiomegaly. No pneumothorax. No pleural effusion. Extensive patchy opacities throughout both lungs are new. IMPRESSION: New extensive patchy opacities throughout both lungs, differential includes COVID-19 pneumonia and/or cardiogenic pulmonary edema given moderate cardiomegaly. Electronically Signed   By: Ilona Sorrel M.D.   On: 01/30/2020 18:07   ECHOCARDIOGRAM LIMITED  Result Date: 01/31/2020    ECHOCARDIOGRAM LIMITED REPORT   Patient Name:   AMILLIA BIFFLE Pinedo Date of Exam: 01/31/2020 Medical Rec #:  [id number]         Height:       65.0 in Accession #:    [id number]        Weight:       178.8 lb Date of Birth:  1935/07/07         BSA:          1.886 m Patient Age:    48 years          BP:  133/63 mmHg Patient Gender: F                 HR:           59 bpm. Exam Location:  Inpatient Procedure: Intracardiac Opacification Agent, Limited Echo, Limited Color Doppler            and Cardiac Doppler Indications:    Dyspnea  786.09 / R06.00  History:        Patient has prior history of Echocardiogram examinations, most                 recent 08/24/2019. CHF, Defibrillator, COPD, Pulmonary HTN and                 PAD, Arrythmias:LBBB; Risk Factors:Former Smoker, Hypertension                 and Dyslipidemia. Chronic hypoxemic respiratory failure                 secondary to COVID-19, sepsis, chronic kidney disease.  Sonographer:    Darlina Sicilian RDCS Referring Phys: [place]  1. Left ventricular ejection fraction, by estimation, is 25 to 30%. The left ventricle has severely decreased function. The left ventricle demonstrates global hypokinesis. Left ventricular diastolic parameters are consistent with Grade II diastolic dysfunction (pseudonormalization). Elevated left atrial pressure.  2. Right ventricular systolic function is normal. The right ventricular size is normal. There is normal pulmonary artery systolic pressure. The estimated right ventricular systolic pressure is 95.6 mmHg.  3. The mitral valve is normal in structure. Mild mitral valve regurgitation.  4. The aortic valve was not well visualized. Aortic valve regurgitation is not visualized. Mild to moderate aortic valve sclerosis/calcification is present, without any evidence of aortic stenosis.  5. The inferior vena cava is normal in size with greater than 50% respiratory variability, suggesting right atrial pressure of 3 mmHg. FINDINGS  Left Ventricle: Left ventricular ejection fraction, by estimation, is 25 to 30%. The left ventricle has severely decreased function. The left ventricle demonstrates global hypokinesis. Definity contrast agent was given IV to delineate the left ventricular endocardial borders. Elevated left atrial pressure. Right Ventricle: The right ventricular size is normal. Right ventricular systolic function is normal. There is normal pulmonary artery systolic pressure. The tricuspid regurgitant velocity is 2.53 m/s, and with an  assumed right atrial pressure of 3 mmHg,  the estimated right ventricular systolic pressure is 21.3 mmHg. Pericardium: There is no evidence of pericardial effusion. Mitral Valve: The mitral valve is normal in structure. Mild mitral valve regurgitation. Tricuspid Valve: The tricuspid valve is normal in structure. Tricuspid valve regurgitation is trivial. Aortic Valve: The aortic valve was not well visualized. Aortic valve regurgitation is not visualized. Mild to moderate aortic valve sclerosis/calcification is present, without any evidence of aortic stenosis. Venous: The inferior vena cava is normal in size with greater than 50% respiratory variability, suggesting right atrial pressure of 3 mmHg. LEFT VENTRICLE PLAX 2D LVIDd:         5.70 cm      Diastology LVIDs:         5.00 cm      LV e' lateral:   2.47 cm/s LV PW:         0.90 cm      LV E/e' lateral: 32.1 LV IVS:        1.10 cm      LV e' medial:    2.40 cm/s LVOT diam:  1.90 cm      LV E/e' medial:  33.0 LV SV:         44 LV SV Index:   23 LVOT Area:     2.84 cm  LV Volumes (MOD) LV vol d, MOD A2C: 146.0 ml LV vol d, MOD A4C: 185.5 ml LV vol s, MOD A2C: 115.0 ml LV vol s, MOD A4C: 117.5 ml LV SV MOD A2C:     31.0 ml LV SV MOD A4C:     185.5 ml LV SV MOD BP:      47.9 ml LEFT ATRIUM         Index LA diam:    3.30 cm 1.75 cm/m  AORTIC VALVE LVOT Vmax:   67.20 cm/s LVOT Vmean:  47.900 cm/s LVOT VTI:    0.156 m MITRAL VALVE                 TRICUSPID VALVE MV Area (PHT): 3.65 cm      TR Peak grad:   25.6 mmHg MV Decel Time: 208 msec      TR Vmax:        253.00 cm/s MR Peak grad:    123.7 mmHg MR Mean grad:    64.0 mmHg   SHUNTS MR Vmax:         556.00 cm/s Systemic VTI:  0.16 m MR Vmean:        367.0 cm/s  Systemic Diam: 1.90 cm MR PISA:         1.01 cm MR PISA Eff ROA: 7 mm MR PISA Radius:  0.40 cm MV E velocity: 79.30 cm/s MV A velocity: 91.30 cm/s MV E/A ratio:  0.87 Oswaldo Milian MD Electronically signed by Oswaldo Milian MD Signature  Date/Time: 01/31/2020/3:57:07 PM    Final

## 2020-02-09 NOTE — TOC Progression Note (Signed)
Transition of Care [facility]) - Progression Note    Patient Details  Name: ARTESHA WEMHOFF MRN: [id number] Date of Birth: Jun 20, 1935  Transition of Care [facility]) CM/SW [place], RN Phone Number: 02/09/2020, 2:05 PM  Clinical Narrative:    Failure to progress, LTACH requiring 65%  FIo2 before they will take. Patient currently at 80% not actively participating in PT.  Patient frequently tired withdrawing from activities, declined speaking to family. Recommend palliative consult to discuss goals of care with patient.  Patient is DNR, still continuing aggressive therapies.    Expected Discharge Plan: [place] Barriers to Discharge: Continued Medical Work up  Expected Discharge Plan and Services Expected Discharge Plan: [place]   Discharge Planning Services: CM Consult   Living arrangements for the past 2 months: Single Family Home                                       Social Determinants of Health (SDOH) Interventions    Readmission Risk Interventions Readmission Risk Prevention Plan 12/08/2019 12/08/2019  Transportation Screening Complete Complete  PCP or Specialist Appt within 3-5 Days Complete -  HRI or Home Care Consult Complete -  Social Work Consult for [place] Planning/Counseling Complete Complete  Palliative Care Screening Not Applicable Not Applicable  Medication Review Press photographer) Complete Complete  Some recent data might be hidden

## 2020-02-10 ENCOUNTER — Ambulatory Visit: Payer: Medicare Other | Admitting: Physician Assistant

## 2020-02-10 ENCOUNTER — Other Ambulatory Visit: Payer: Self-pay

## 2020-02-10 ENCOUNTER — Inpatient Hospital Stay
Admission: AD | Admit: 2020-02-10 | Discharge: 2020-03-16 | Disposition: E | Payer: Medicare Other | Source: Other Acute Inpatient Hospital | Attending: Internal Medicine | Admitting: Internal Medicine

## 2020-02-10 ENCOUNTER — Ambulatory Visit: Payer: Self-pay

## 2020-02-10 DIAGNOSIS — N179 Acute kidney failure, unspecified: Secondary | ICD-10-CM | POA: Diagnosis present

## 2020-02-10 DIAGNOSIS — N1832 Chronic kidney disease, stage 3b: Secondary | ICD-10-CM

## 2020-02-10 DIAGNOSIS — T148XXA Other injury of unspecified body region, initial encounter: Secondary | ICD-10-CM

## 2020-02-10 DIAGNOSIS — E1121 Type 2 diabetes mellitus with diabetic nephropathy: Secondary | ICD-10-CM

## 2020-02-10 DIAGNOSIS — J9621 Acute and chronic respiratory failure with hypoxia: Secondary | ICD-10-CM | POA: Diagnosis present

## 2020-02-10 DIAGNOSIS — J189 Pneumonia, unspecified organism: Secondary | ICD-10-CM

## 2020-02-10 DIAGNOSIS — J1282 Pneumonia due to coronavirus disease 2019: Secondary | ICD-10-CM | POA: Diagnosis present

## 2020-02-10 DIAGNOSIS — J9690 Respiratory failure, unspecified, unspecified whether with hypoxia or hypercapnia: Secondary | ICD-10-CM

## 2020-02-10 DIAGNOSIS — U071 COVID-19: Secondary | ICD-10-CM | POA: Diagnosis present

## 2020-02-10 DIAGNOSIS — I5042 Chronic combined systolic (congestive) and diastolic (congestive) heart failure: Secondary | ICD-10-CM | POA: Diagnosis present

## 2020-02-10 HISTORY — DX: Chronic combined systolic (congestive) and diastolic (congestive) heart failure: I50.42

## 2020-02-10 HISTORY — DX: COVID-19: U07.1

## 2020-02-10 HISTORY — DX: Acute and chronic respiratory failure with hypoxia: J96.21

## 2020-02-10 HISTORY — DX: Chronic kidney disease, stage 3b: N18.32

## 2020-02-10 LAB — COMPREHENSIVE METABOLIC PANEL
ALT: 23 U/L (ref 0–44)
AST: 20 U/L (ref 15–41)
Albumin: 2.7 g/dL — ABNORMAL LOW (ref 3.5–5.0)
Alkaline Phosphatase: 66 U/L (ref 38–126)
Anion gap: 15 (ref 5–15)
BUN: 112 mg/dL — ABNORMAL HIGH (ref 8–23)
CO2: 28 mmol/L (ref 22–32)
Calcium: 9.7 mg/dL (ref 8.9–10.3)
Chloride: 91 mmol/L — ABNORMAL LOW (ref 98–111)
Creatinine, Ser: 2.4 mg/dL — ABNORMAL HIGH (ref 0.44–1.00)
GFR calc Af Amer: 21 mL/min — ABNORMAL LOW (ref >60)
GFR calc non Af Amer: 18 mL/min — ABNORMAL LOW (ref >60)
Glucose, Bld: 127 mg/dL — ABNORMAL HIGH (ref 70–99)
Potassium: 4.4 mmol/L (ref 3.5–5.1)
Sodium: 134 mmol/L — ABNORMAL LOW (ref 135–145)
Total Bilirubin: 1.1 mg/dL (ref 0.3–1.2)
Total Protein: 6.2 g/dL — ABNORMAL LOW (ref 6.5–8.1)

## 2020-02-10 LAB — BLOOD GAS, ARTERIAL
Acid-Base Excess: 4.6 mmol/L — ABNORMAL HIGH (ref 0.0–2.0)
Bicarbonate: 27.8 mmol/L (ref 20.0–28.0)
FIO2: 100
O2 Saturation: 92 %
Patient temperature: 36
pCO2 arterial: 33.8 mmHg (ref 32.0–48.0)
pH, Arterial: 7.521 — ABNORMAL HIGH (ref 7.350–7.450)
pO2, Arterial: 57.4 mmHg — ABNORMAL LOW (ref 83.0–108.0)

## 2020-02-10 LAB — CBC
HCT: 45.4 % (ref 36.0–46.0)
Hemoglobin: 15.3 g/dL — ABNORMAL HIGH (ref 12.0–15.0)
MCH: 31.1 pg (ref 26.0–34.0)
MCHC: 33.7 g/dL (ref 30.0–36.0)
MCV: 92.3 fL (ref 80.0–100.0)
Platelets: 239 10*3/uL (ref 150–400)
RBC: 4.92 MIL/uL (ref 3.87–5.11)
RDW: 14.5 % (ref 11.5–15.5)
WBC: 18.1 10*3/uL — ABNORMAL HIGH (ref 4.0–10.5)
nRBC: 0 % (ref 0.0–0.2)

## 2020-02-10 LAB — MAGNESIUM: Magnesium: 2.6 mg/dL — ABNORMAL HIGH (ref 1.7–2.4)

## 2020-02-10 LAB — GLUCOSE, CAPILLARY
Glucose-Capillary: 109 mg/dL — ABNORMAL HIGH (ref 70–99)
Glucose-Capillary: 152 mg/dL — ABNORMAL HIGH (ref 70–99)

## 2020-02-10 MED ORDER — PANTOPRAZOLE SODIUM 40 MG PO TBEC: 40.0000 mg | DELAYED_RELEASE_TABLET | Freq: Two times a day (BID) | ORAL | 0 refills | Status: AC

## 2020-02-10 MED ORDER — INSULIN ASPART 100 UNIT/ML ~~LOC~~ SOLN: SUBCUTANEOUS | 11 refills | Status: AC

## 2020-02-10 MED ORDER — INSULIN GLARGINE 100 UNIT/ML ~~LOC~~ SOLN: 18.0000 [IU] | Freq: Every day | SUBCUTANEOUS | 11 refills | Status: AC

## 2020-02-10 MED ORDER — ALLOPURINOL 100 MG PO TABS: 200.0000 mg | ORAL_TABLET | Freq: Every day | ORAL | Status: AC

## 2020-02-10 MED ORDER — IPRATROPIUM-ALBUTEROL 20-100 MCG/ACT IN AERS: 1.0000 | INHALATION_SPRAY | Freq: Three times a day (TID) | RESPIRATORY_TRACT | Status: AC

## 2020-02-10 MED ORDER — PREDNISONE 5 MG PO TABS: 5.0000 mg | ORAL_TABLET | Freq: Every day | ORAL | 0 refills | Status: AC

## 2020-02-10 MED ORDER — ISOSORBIDE MONONITRATE ER 30 MG PO TB24: 30.0000 mg | ORAL_TABLET | Freq: Every day | ORAL | Status: AC

## 2020-02-10 MED ORDER — PREDNISONE 20 MG PO TABS: 40.0000 mg | ORAL_TABLET | Freq: Every day | ORAL | 0 refills | Status: AC

## 2020-02-10 MED ORDER — PREDNISONE 20 MG PO TABS: 20.0000 mg | ORAL_TABLET | Freq: Every day | ORAL | 0 refills | Status: AC

## 2020-02-10 MED ORDER — ENOXAPARIN SODIUM 30 MG/0.3ML ~~LOC~~ SOLN: 30.0000 mg | SUBCUTANEOUS | Status: AC

## 2020-02-10 MED ORDER — PREDNISONE 10 MG PO TABS: 30.0000 mg | ORAL_TABLET | Freq: Every day | ORAL | 0 refills | Status: AC

## 2020-02-10 MED ORDER — PREDNISONE 10 MG PO TABS: 10.0000 mg | ORAL_TABLET | Freq: Every day | ORAL | 0 refills | Status: AC

## 2020-02-10 MED ORDER — PREDNISONE 50 MG PO TABS: 50.0000 mg | ORAL_TABLET | Freq: Every day | ORAL | Status: AC

## 2020-02-10 NOTE — TOC Progression Note (Addendum)
Transition of Care [facility]) - Progression Note    Patient Details  Name: REKIA KUJALA MRN: [id number] Date of Birth: 1935-07-14  Transition of Care [facility]) CM/SW [place], LCSW Phone Number: 01/28/2020, 10:52 AM  Clinical Narrative:    10:52am-CSW received request from MD to evaluate appropriatness for LTACH now that patient's oxygen FIO2 is less. CSW sent referral to both [facility] and [facility] for review. They have both accepted patient and have a bed available today. CSW contacted patient's son, [name], and offered choice. CSW emailed him ([email or web address]) Medicare ratings list and he will review and contact CSW back. He is in agreement for patient to transfer to [facility] today.   11:03am-[name] called CSW back and has Licensed conveyancer. CSW made [facility] aware and they will contact [name] to arrange paperwork for transfer today.   Expected Discharge Plan: [place] Barriers to Discharge: Continued Medical Work up  Expected Discharge Plan and Services Expected Discharge Plan: [place]   Discharge Planning Services: CM Consult   Living arrangements for the past 2 months: Single Family Home                                       Social Determinants of Health (SDOH) Interventions    Readmission Risk Interventions Readmission Risk Prevention Plan 12/08/2019 12/08/2019  Transportation Screening Complete Complete  PCP or Specialist Appt within 3-5 Days Complete -  HRI or Home Care Consult Complete -  Social Work Consult for [place] Planning/Counseling Complete Complete  Palliative Care Screening Not Applicable Not Applicable  Medication Review Press photographer) Complete Complete  Some recent data might be hidden

## 2020-02-10 NOTE — Progress Notes (Signed)
ICM Remote Transmission rescheduled to 02/27/2020 due to hospitalization.

## 2020-02-10 NOTE — Care Management Important Message (Signed)
Important Message  Patient Details  Name: Sydney Phillips MRN: [id number] Date of Birth: Nov 22, 1935   Medicare Important Message Given:  Yes - Important Message mailed due to current National Emergency  Verbal consent obtained due to current National Emergency  Relationship to patient: Self Contact Name: Chioma Mukherjee Call Date: 02/12/2020  Time: 1443 Phone: [phone] Outcome: No Answer/Busy Important Message mailed to: Patient address on file    Delorse Lek 02/07/2020, 2:44 PM

## 2020-02-10 NOTE — TOC Transition Note (Signed)
Transition of Care [facility]) - CM/SW Discharge Note   Patient Details  Name: DANYIEL CRESPIN MRN: [id number] Date of Birth: Nov 06, 1935  Transition of Care [facility]) CM/SW Contact:  Benard Halsted, LCSW Phone Number: 01/20/2020, 1:28 PM   Clinical Narrative:    [facility] Anticipated DC date: 01/23/2020 Family notified: Son, [name] Transport by: RN to wheel pt over in bed   Per MD patient ready for DC to [facility]. RN to call report prior to discharge [phone] Room 5E01). RN, patient, patient's family, and facility notified of DC. Discharge Summary sent to facility. DNR signedt on chart.  CSW will sign off for now as social work intervention is no longer needed. Please consult [place] again if new needs arise.      Final next level of care: Long Term Acute Care (LTAC) Barriers to Discharge: Barriers Resolved   Patient Goals and CMS Choice Patient states their goals for this hospitalization and ongoing recovery are:: Get home   Choice offered to / list presented to : Adult Children  Discharge Placement              Patient chooses bed at: Other - please specify in the comment section below: ([facility]) Patient to be transferred to facility by: RN Name of family member notified: Son, [name] Patient and family notified of of transfer: 02/09/2020  Discharge Plan and Services   Discharge Planning Services: CM Consult                                 Social Determinants of Health (SDOH) Interventions     Readmission Risk Interventions Readmission Risk Prevention Plan 01/23/2020 12/08/2019 12/08/2019  Transportation Screening Complete Complete Complete  PCP or Specialist Appt within 3-5 Days - Complete -  HRI or [place] - Complete -  Social Work Consult for [place] Planning/Counseling - Complete Complete  Palliative Care Screening - Not Applicable Not Applicable  Medication Review Press photographer) Referral to Pharmacy Complete  Complete  PCP or Specialist appointment within 3-5 days of discharge Complete - -  [place] or Home Care Consult Complete - -  SW Recovery Care/Counseling Consult Complete - -  Palliative Care Screening Not Applicable - -  [place] Not Applicable - -  Some recent data might be hidden

## 2020-02-10 NOTE — Discharge Summary (Addendum)
PATIENT DETAILS Name: Sydney Phillips Age: 84 y.o. Sex: female Date of Birth: 07-May-1936 MRN: [id number]. Admitting Physician: Juanito Doom, MD PCP:Fry, Ishmael Holter, MD  Admit Date: 01/30/2020 Discharge date: 01/29/2020  Recommendations for Outpatient Follow-up:  1. [facility]-continue tapering steroids as prescribed. 2. Continue to titrate down FiO2 as tolerated. 3. Continue to monitor renal function-creatinine fluctuating-and diuretics remain on hold  Admitted From:  Home  Disposition: [place]:  Yes  Equipment/Devices: None  Discharge Condition: Stable  CODE STATUS:  DNR  Diet recommendation:  Diet Order            Diet - low sodium heart healthy           Diet Carb Modified           Diet regular Room service appropriate? Yes; Fluid consistency: Thin  Diet effective now                  Brief Narrative: Patient is a 84 y.o. female with PMHx of chronic systolic heart failure s/p CRT, ventricular tachycardia, COPD on home O2-mostly at night, HTN, DM-2, CKD stage IIIb-recently hospitalized from 8/4-8/9 for hypoxia from COVID-19-admitted by PCCM to the ICU for profound hypoxemia requiring heated high flow/NRB due to ongoing Covid pneumonitis with progressive fibrotic changes.  See below for further details.  Vaccination status: Unvaccinated  Significant Events: 7/29-8/1>> admit to [facility] by cardiology for decompensated heart failure 8/5>> COVID-19 positive 8/4-8/9>> hospitalized for hypoxia due to COVID-19 pneumonia-discharged on home O2 8/16>> readmitted to ICU for severe hypoxemia 8/20>> transfer to [facility]  Significant studies: 01/31/2020>> Echo: EF 95-62%, grade 2 diastolic dysfunction 07/16/8655>> chest x-ray: Widespread interstitial/airspace opacity bilaterally. 01/30/2020>> chest x-ray: New extensive patchy opacities throughout both lungs. 01/19/2020>>Chest x-ray: Cardiomegaly with bilateral  interstitial prominence. 08/24/2019>> echo: EF 20-25%  COVID-19 medications: Remdesivir: 8/5>> 8/9 Steroids: 8/16>>  Antibiotics: Cefepime: 8/16>> 8/22 Vancomycin: 8/16>> 8/18  Microbiology data: 8/16>> blood culture: No growth 8/5 >>blood culture: No growth  Procedures: None  Consults: PCCM  Brief Hospital Course: Acute on chronic hypoxic hypoxic Resp Failure (on home O2 2 L nocturnally) due to Covid 19 Viral pneumonia with progressive fibrotic changes (organizing pneumonia) superimposed on HCAP:  Initially admitted by PCCM to the ICU-remains on heated high flow-but FiO2 requirements are slowly coming down. Suspected to have fibrotic changes/organizing pneumonia provoked by COVID-19. However on amiodarone as well-after discussion with Dr. Samuel Bouche was discontinued on 8/21. Treated with steroids, antibiotics and diuretics. Remains on long steroid taper as recommended by PCCM. Continue attempts to slowly titrate down FiO2.   Her overall prognosis is very poor-she is already a DNR-plans are to watch closely and hope for clinical improvement-however if in the meantime she were to deteriorate significantly-apart from transitioning to comfort measures-we really do not have any other options (patient-son ([name]) aware)  Doubt patient had sepsis-sepsis ruled out  COVID-19 Labs:  Recent Labs    02/08/20 0801 02/09/20 0156  DDIMER 1.27* 1.27*  CRP 1.2* 1.3*    Lab Results  Component Value Date   SARSCOV2NAA POSITIVE (A) 01/19/2020   [place] NEGATIVE 01/12/2020   [place] NEGATIVE 12/11/2019   [place] NEGATIVE 12/05/2019    Hyperkalemia: Resolved-treated with Lokelma.Given insulin/D50 on 8/24.    Chest pain: Occurred on 8/23 evening-troponins negative.  EKG without any major changes.  Has known CAD.  However could have been noncardiac pain-and GI in origin.  Change PPI to twice daily-continue with aspirin/Plavix/statin/beta-blocker.  Per my discussion with Dr.  Sallyanne Kuster on 8/23-given acute illness-severe hypoxemia-really not a candidate for any intervention at this point.  CAD: No anginal symptoms-continue aspirin/Plavix/beta-blocker follow.  Last [facility] May 2021 showed stable anatomy.  Chronic systolic heart failure: Volume status is stable-we will hold Demadex given uptrending creatinine.  Watch creatinine closely.  AKI on CKD stage IIIb: Creatinine continues to trend up-continue to hold Demadex. BUN elevated in part due to steroid use. Follow creatinine closely-if worsens significantly-consider nephrology evaluation at some point-but given her acute illness-severity of hypoxemia-her underlying cardiology issues-she may not be a good candidate for long-term renal replacement therapy.  PAD: History of subclavian stenting in 2013-stable.  History of ventricular tachycardia-s/p ICD shocks prompting her last hospitalization: Continue telemetry monitoring- amiodarone discontinued due to concern that it may be exacerbating lung fibrosis.  Case was discussed with cardiologist-Dr. Haroldine Laws on 8/21.  Recommendations were to monitor on telemetry-she has an ICD in place.  Follow magnesium/potassium-keep magnesium more than 2 and potassium more than 4.  DM-2 (A1c 6.7 on 7/31) with uncontrolled hyperglycemia secondary to steroids: CBGs relatively stable-continue Lantus 18 units and SSI-follow and adjust.   Depression/anxiety: Appears stable-continue Cymbalta and Xanax.  Nondisplaced left wrist fracture: Continue with splint-WABT through elbow.  Follow-up with Dr. Jeannie Fend in 2 weeks.  Was evaluated by orthopedics on 7/29.  Debility/deconditioning: Secondary to acute illness-evaluated by PT with recommendations for home health on discharge.  Palliative care discussion: DNR in place.  Have discussed with patient's son [name] multiple times over the past few days-he is aware of patient's very poor overall prognosis-and the potential that she could deteriorate  further.  She is already with severe hypoxia.  He is aware that if she were to deteriorate any further-apart from transitioning to hospice care we really do not have any other options.  However she has been stable-comfortable-over the past few days-plans are to continue supportive care steroids and follow clinical course.   RN pressure injury documentation: Pressure Injury 01/31/20 Coccyx Medial Stage 1 -  Intact skin with non-blanchable redness of a localized area usually over a bony prominence. (Active)  01/31/20 1826  Location: Coccyx  Location Orientation: Medial  Staging: Stage 1 -  Intact skin with non-blanchable redness of a localized area usually over a bony prominence.  Wound Description (Comments):   Present on Admission: Yes    Discharge Diagnoses:  Active Problems:   Acute hypoxemic respiratory failure (HCC)   Acute respiratory distress syndrome (ARDS) due to COVID-19 virus (HCC)   Pressure injury of skin   Discharge Instructions:    Person Under Monitoring Name: DINESHA TWIGGS  Location: [address] [facility] [address]   Infection Prevention Recommendations for Individuals Confirmed to have, or Being Evaluated for, 2019 Novel Coronavirus (COVID-19) Infection Who Receive Care at Home  Individuals who are confirmed to have, or are being evaluated for, COVID-19 should follow the prevention steps below until a healthcare provider or local or state health department says they can return to normal activities.  Stay home except to get medical care You should restrict activities outside your home, except for getting medical care. Do not go to work, school, or public areas, and do not use public transportation or taxis.  Call ahead before visiting your doctor Before your medical appointment, call the healthcare provider and tell them that you have, or are being evaluated for, COVID-19 infection. This will help the healthcare provider's office take  steps to keep other people from getting infected.  Ask your healthcare provider to call the local or state health department.  Monitor your symptoms Seek prompt medical attention if your illness is worsening (e.g., difficulty breathing). Before going to your medical appointment, call the healthcare provider and tell them that you have, or are being evaluated for, COVID-19 infection. Ask your healthcare provider to call the local or state health department.  Wear a facemask You should wear a facemask that covers your nose and mouth when you are in the same room with other people and when you visit a healthcare provider. People who live with or visit you should also wear a facemask while they are in the same room with you.  Separate yourself from other people in your home As much as possible, you should stay in a different room from other people in your home. Also, you should use a separate bathroom, if available.  Avoid sharing household items You should not share dishes, drinking glasses, cups, eating utensils, towels, bedding, or other items with other people in your home. After using these items, you should wash them thoroughly with soap and water.  Cover your coughs and sneezes Cover your mouth and nose with a tissue when you cough or sneeze, or you can cough or sneeze into your sleeve. Throw used tissues in a lined trash can, and immediately wash your hands with soap and water for at least 20 seconds or use an alcohol-based hand rub.  Wash your [facility] your hands often and thoroughly with soap and water for at least 20 seconds. You can use an alcohol-based hand sanitizer if soap and water are not available and if your hands are not visibly dirty. Avoid touching your eyes, nose, and mouth with unwashed hands.   Prevention Steps for Caregivers and Household Members of Individuals Confirmed to have, or Being Evaluated for, COVID-19 Infection Being Cared for in the Home  If you  live with, or provide care at home for, a person confirmed to have, or being evaluated for, COVID-19 infection please follow these guidelines to prevent infection:  Follow healthcare provider's instructions Make sure that you understand and can help the patient follow any healthcare provider instructions for all care.  Provide for the patient's basic needs You should help the patient with basic needs in the home and provide support for getting groceries, prescriptions, and other personal needs.  Monitor the patient's symptoms If they are getting sicker, call his or her medical provider and tell them that the patient has, or is being evaluated for, COVID-19 infection. This will help the healthcare provider's office take steps to keep other people from getting infected. Ask the healthcare provider to call the local or state health department.  Limit the number of people who have contact with the patient  If possible, have only one caregiver for the patient.  Other household members should stay in another home or place of residence. If this is not possible, they should stay  in another room, or be separated from the patient as much as possible. Use a separate bathroom, if available.  Restrict visitors who do not have an essential need to be in the home.  Keep older adults, very young children, and other sick people away from the patient Keep older adults, very young children, and those who have compromised immune systems or chronic health conditions away from the patient. This includes people with chronic heart, lung, or kidney conditions, diabetes, and cancer.  Ensure good ventilation Make sure that shared spaces in  the home have good air flow, such as from an air conditioner or an opened window, weather permitting.  Wash your hands often  Wash your hands often and thoroughly with soap and water for at least 20 seconds. You can use an alcohol based hand sanitizer if soap and water are  not available and if your hands are not visibly dirty.  Avoid touching your eyes, nose, and mouth with unwashed hands.  Use disposable paper towels to dry your hands. If not available, use dedicated cloth towels and replace them when they become wet.  Wear a facemask and gloves  Wear a disposable facemask at all times in the room and gloves when you touch or have contact with the patient's blood, body fluids, and/or secretions or excretions, such as sweat, saliva, sputum, nasal mucus, vomit, urine, or feces.  Ensure the mask fits over your nose and mouth tightly, and do not touch it during use.  Throw out disposable facemasks and gloves after using them. Do not reuse.  Wash your hands immediately after removing your facemask and gloves.  If your personal clothing becomes contaminated, carefully remove clothing and launder. Wash your hands after handling contaminated clothing.  Place all used disposable facemasks, gloves, and other waste in a lined container before disposing them with other household waste.  Remove gloves and wash your hands immediately after handling these items.  Do not share dishes, glasses, or other household items with the patient  Avoid sharing household items. You should not share dishes, drinking glasses, cups, eating utensils, towels, bedding, or other items with a patient who is confirmed to have, or being evaluated for, COVID-19 infection.  After the person uses these items, you should wash them thoroughly with soap and water.  Wash laundry thoroughly  Immediately remove and wash clothes or bedding that have blood, body fluids, and/or secretions or excretions, such as sweat, saliva, sputum, nasal mucus, vomit, urine, or feces, on them.  Wear gloves when handling laundry from the patient.  Read and follow directions on labels of laundry or clothing items and detergent. In general, wash and dry with the warmest temperatures recommended on the label.  Clean  all areas the individual has used often  Clean all touchable surfaces, such as counters, tabletops, doorknobs, bathroom fixtures, toilets, phones, keyboards, tablets, and bedside tables, every day. Also, clean any surfaces that may have blood, body fluids, and/or secretions or excretions on them.  Wear gloves when cleaning surfaces the patient has come in contact with.  Use a diluted bleach solution (e.g., dilute bleach with 1 part bleach and 10 parts water) or a household disinfectant with a label that says EPA-registered for coronaviruses. To make a bleach solution at home, add 1 tablespoon of bleach to 1 quart (4 cups) of water. For a larger supply, add  cup of bleach to 1 gallon (16 cups) of water.  Read labels of cleaning products and follow recommendations provided on product labels. Labels contain instructions for safe and effective use of the cleaning product including precautions you should take when applying the product, such as wearing gloves or eye protection and making sure you have good ventilation during use of the product.  Remove gloves and wash hands immediately after cleaning.  Monitor yourself for signs and symptoms of illness Caregivers and household members are considered close contacts, should monitor their health, and will be asked to limit movement outside of the home to the extent possible. Follow the monitoring steps for close contacts listed  on the symptom monitoring form.   ? If you have additional questions, contact your local health department or call the epidemiologist on call at [phone] (available 24/7). ? This guidance is subject to change. For the most up-to-date guidance from CDC, please refer to their website: YouBlogs.pl    Activity:  As tolerated with Full fall precautions use walker/cane & assistance as needed  Discharge Instructions    Call MD for:  difficulty breathing, headache  or visual disturbances   Complete by: As directed    Diet - low sodium heart healthy   Complete by: As directed    Diet Carb Modified   Complete by: As directed    Increase activity slowly   Complete by: As directed    No wound care   Complete by: As directed      Allergies as of 02/01/2020      Reactions   Potassium-containing Compounds Other (See Comments)   Causes severe constipation   Neomycin-polymyxin B Gu    Swollen throat    Other Other (See Comments)   "mycin" antibiotics cause sleeplessness and itching   Azithromycin Rash   Codeine Itching   Darvon Itching   Erythromycin Rash   Meloxicam Other (See Comments)   Unknown    Norco [hydrocodone-acetaminophen] Itching   Penicillins Rash, Other (See Comments)   Has patient had a PCN reaction causing immediate rash, facial/tongue/throat swelling, SOB or lightheadedness with hypotension: unknown Has patient had a PCN reaction causing severe rash involving mucus membranes or skin necrosis: no Has patient had a PCN reaction that required hospitalization: unknown Has patient had a PCN reaction occurring within the last 10 years: no If all of the above answers are "NO", then may proceed with Cephalosporin use.   Propoxyphene N-acetaminophen Itching   Rofecoxib Other (See Comments)   Unknown    Rosuvastatin Other (See Comments)   cramps   Statins Itching, Other (See Comments)   Sleeplessness Sleeplessness   Sulfa Antibiotics Rash      Medication List    STOP taking these medications   amiodarone 200 MG tablet Commonly known as: PACERONE   dexamethasone 6 MG tablet Commonly known as: DECADRON   spironolactone 25 MG tablet Commonly known as: ALDACTONE   torsemide 20 MG tablet Commonly known as: DEMADEX     TAKE these medications   Accu-Chek FastClix Lancets Misc USE   TO CHECK GLUCOSE ONCE DAILY What changed:   how much to take  how to take this  when to take this  additional instructions   Accu-Chek  SmartView test strip Generic drug: glucose blood USE  STRIP TO CHECK GLUCOSE ONCE DAILY What changed: See the new instructions.   albuterol (2.5 MG/3ML) 0.083% nebulizer solution Commonly known as: PROVENTIL USE 1 VIAL IN NEBULIZER EVERY 4 HOURS AS NEEDED FOR WHEEZING FOR SHORTNESS OF BREATH What changed: See the new instructions.   ProAir HFA 108 (90 Base) MCG/ACT inhaler Generic drug: albuterol INHALE 2 PUFFS BY MOUTH EVERY 4 HOURS AS NEEDED FOR WHEEZING AND FOR SHORTNESS OF BREATH What changed: See the new instructions.   allopurinol 100 MG tablet Commonly known as: ZYLOPRIM Take 2 tablets (200 mg total) by mouth daily. What changed:   medication strength  how much to take   ALPRAZolam 1 MG tablet Commonly known as: XANAX TAKE 1 TABLET BY MOUTH IN THE MORNING AND 1 & 1/2 (ONE & ONE-HALF) NIGHTLY AS NEEDED FOR ANXIETY What changed: See the new instructions.   aspirin  81 MG chewable tablet Chew 1 tablet (81 mg total) by mouth daily.   azelastine 0.1 % nasal spray Commonly known as: ASTELIN USE 2 SPRAY(S) IN EACH NOSTRIL TWICE DAILY AS DIRECTED What changed: See the new instructions.   bisoprolol 5 MG tablet Commonly known as: ZEBETA Take 1 tablet (5 mg total) by mouth daily.   clopidogrel 75 MG tablet Commonly known as: PLAVIX Take 1 tablet by mouth once daily   Cyanocobalamin 5000 MCG Tbdp Take 5,000 mcg by mouth daily.   DULoxetine 60 MG capsule Commonly known as: CYMBALTA Take 1 capsule by mouth twice daily   enoxaparin 30 MG/0.3ML injection Commonly known as: LOVENOX Inject 0.3 mLs (30 mg total) into the skin daily.   insulin aspart 100 UNIT/ML injection Commonly known as: novoLOG Correction coverage: Moderate (average weight, post-op) CBG < 70: Implement Hypoglycemia Standing Orders and refer to Hypoglycemia Standing Orders sidebar report CBG 70 - 120: 0 units CBG 121 - 150: 2 units CBG 151 - 200: 3 units CBG 201 - 250: 5 units CBG 251 - 300: 8  units CBG 301 - 350: 11 units CBG 351 - 400: 15 units CBG > 400: call MD and obtain STAT lab verification   insulin glargine 100 UNIT/ML injection Commonly known as: LANTUS Inject 0.18 mLs (18 Units total) into the skin daily. Start taking on: February 11, 2020   Ipratropium-Albuterol 20-100 MCG/ACT Aers respimat Commonly known as: COMBIVENT Inhale 1 puff into the lungs 3 (three) times daily.   isosorbide mononitrate 30 MG 24 hr tablet Commonly known as: IMDUR Take 1 tablet (30 mg total) by mouth daily. Start taking on: February 11, 2020   loratadine 10 MG tablet Commonly known as: CLARITIN Take 10 mg by mouth daily.   magnesium oxide 400 (241.3 Mg) MG tablet Commonly known as: MAG-OX Take 1 tablet (400 mg total) by mouth daily.   nitroGLYCERIN 0.4 MG SL tablet Commonly known as: NITROSTAT Place 1 tablet (0.4 mg total) under the tongue every 5 (five) minutes as needed for chest pain. X 3 doses What changed:   when to take this  additional instructions   pantoprazole 40 MG tablet Commonly known as: PROTONIX Take 1 tablet (40 mg total) by mouth 2 (two) times daily. What changed: when to take this   potassium chloride SA 20 MEQ tablet Commonly known as: KLOR-CON Take 1 tablet (20 mEq total) by mouth daily.   predniSONE 50 MG tablet Commonly known as: DELTASONE Take 1 tablet (50 mg total) by mouth daily with breakfast for 5 doses. 50 mg, Oral, Daily with breakfast, First dose on Tue 02/07/20 at 0800, For 5 doses   predniSONE 20 MG tablet Commonly known as: DELTASONE Take 2 tablets (40 mg total) by mouth daily with breakfast for 7 days. Start taking on: February 12, 2020   predniSONE 10 MG tablet Commonly known as: DELTASONE Take 3 tablets (30 mg total) by mouth daily with breakfast for 7 doses. Start taking on: February 19, 2020   predniSONE 20 MG tablet Commonly known as: DELTASONE Take 1 tablet (20 mg total) by mouth daily with breakfast for 7 doses. Start taking  on: February 26, 2020   predniSONE 10 MG tablet Commonly known as: DELTASONE Take 1 tablet (10 mg total) by mouth daily with breakfast for 7 doses. Start taking on: March 04, 2020   predniSONE 5 MG tablet Commonly known as: DELTASONE Take 1 tablet (5 mg total) by mouth daily with breakfast for 3  doses. Start taking on: March 11, 2020   VITAMIN C PO Take 1 tablet by mouth in the morning and at bedtime.   Vitamin D 50 MCG (2000 UT) Caps Take 4,000 Units by mouth daily.       Follow-up Information    Laurey Morale, MD. Schedule an appointment as soon as possible for a visit in 1 week(s).   Specialty: Family Medicine Contact information: [place] [address] [phone]        Croitoru, Dani Gobble, MD Follow up in 1 month(s).   Specialty: Cardiology Contact information: [address] [place] [address] [phone]        [name], MD .   Specialty: Cardiology Contact information: [address] [place] [address] [phone]              Allergies  Allergen Reactions  . Potassium-Containing Compounds Other (See Comments)    Causes severe constipation  . Neomycin-Polymyxin B Gu     Swollen throat   . Other Other (See Comments)    "mycin" antibiotics cause sleeplessness and itching  . Azithromycin Rash  . Codeine Itching  . Darvon Itching  . Erythromycin Rash  . Meloxicam Other (See Comments)    Unknown    . Norco [Hydrocodone-Acetaminophen] Itching  . Penicillins Rash and Other (See Comments)    Has patient had a PCN reaction causing immediate rash, facial/tongue/throat swelling, SOB or lightheadedness with hypotension: unknown Has patient had a PCN reaction causing severe rash involving mucus membranes or skin necrosis: no Has patient had a PCN reaction that required hospitalization: unknown Has patient had a PCN reaction occurring within the last 10 years: no If all of the  above answers are "NO", then may proceed with Cephalosporin use.   Marland Kitchen Propoxyphene N-Acetaminophen Itching  . Rofecoxib Other (See Comments)    Unknown    . Rosuvastatin Other (See Comments)    cramps  . Statins Itching and Other (See Comments)    Sleeplessness Sleeplessness  . Sulfa Antibiotics Rash     Other Procedures/Studies: DG Chest 2 View  Result Date: 01/19/2020 CLINICAL DATA:  Fatigue.  History of CHF. EXAM: CHEST - 2 VIEW COMPARISON:  12/05/2019.  09/30/2019. FINDINGS: AICD in stable position. Again noted cardiomegaly with bilateral interstitial prominence suggesting CHF. Small bilateral pleural effusion may be present. Calcified left lung nodule consistent with granuloma again noted. No pneumothorax. Degenerative change thoracic spine. Lower thoracic spine compression fracture noted on today's exam. IMPRESSION: 1. AICD in stable position. Cardiomegaly with bilateral interstitial prominence again noted suggesting CHF. Similar findings noted on prior exam. Small bilateral pleural effusions may be present. 2. Lower thoracic vertebral body compression fracture may be present. Electronically Signed   By: Marcello Moores  Register   On: 01/19/2020 05:14   DG Chest 2 View  Result Date: 01/12/2020 CLINICAL DATA:  Chest pain. EXAM: CHEST - 2 VIEW COMPARISON:  12/05/2019. FINDINGS: AICD in stable position. Again noted cardiomegaly with pulmonary venous congestion bilateral interstitial prominence and small bilateral pleural effusions. Findings consistent CHF. Similar findings noted on prior exam. Calcified pulmonary nodules noted consistent prior granulomas disease. No pneumothorax. IMPRESSION: AICD in stable position. Cardiomegaly with pulmonary venous congestion, bilateral interstitial prominence, and small bilateral pleural effusions again noted. Findings again consistent with CHF. Similar findings noted on prior exam. Electronically Signed   By: [place]   On: 01/12/2020 05:20   DG Wrist  Complete Left  Result  Date: 01/12/2020 CLINICAL DATA:  Persistent left wrist pain after fall 1 month ago EXAM: LEFT WRIST - COMPLETE 3+ VIEW COMPARISON:  12/26/2019 FINDINGS: No definite acute fracture is identified. Possible subtle horizontal lucency at the distal ulna could reflect a subacute nondisplaced fracture. No definite fracture line is seen in at the radial styloid. Mildly widened scapholunate interval of 3 mm. Similar degree of osteoarthritis, most pronounced at the first [facility] and triscaphe joints. Bones are demineralized. Soft tissue swelling persists. IMPRESSION: 1. Possible subtle subacute nondisplaced fracture at the distal ulna. Correlate with point tenderness. 2. No definite acute or healing distal radial fracture is identified. 3. Mildly widened scapholunate interval of 3 mm suggesting underlying ligamentous injury. Electronically Signed   By: Davina Poke D.O.   On: 01/12/2020 08:47   DG Chest Port 1 View  Result Date: 01/31/2020 CLINICAL DATA:  Hypoxia.  Reported COVID-19 positive EXAM: PORTABLE CHEST 1 VIEW COMPARISON:  January 30, 2020 and January 19, 2020 FINDINGS: Extensive patchy interstitial and airspace opacity bilaterally is essentially stable compared to 1 day prior with significant progression of disease since 2 weeks prior. Mild consolidation in the left base is stable. There is cardiomegaly with pulmonary vascularity within normal limits. Pacemaker leads attached to right atrium, right ventricle, and coronary sinus. No adenopathy appreciable. There is aortic atherosclerosis. No bone lesions. IMPRESSION: Widespread interstitial and airspace opacity bilaterally, stable compared to 1 day prior. Mild consolidation left base. No new opacity evident. Stable cardiac prominence. Stable pacemaker lead positioning. No adenopathy appreciable. Aortic Atherosclerosis (ICD10-I70.0). Electronically Signed   By: Lowella Grip III M.D.   On: 01/31/2020 11:38   DG Chest Port 1  View  Result Date: 01/30/2020 CLINICAL DATA:  COVID-19 positive EXAM: PORTABLE CHEST 1 VIEW COMPARISON:  01/26/2020 chest radiograph. FINDINGS: Stable 3 lead left subclavian ICD. Stable cardiomediastinal silhouette with moderate cardiomegaly. No pneumothorax. No pleural effusion. Extensive patchy opacities throughout both lungs are new. IMPRESSION: New extensive patchy opacities throughout both lungs, differential includes COVID-19 pneumonia and/or cardiogenic pulmonary edema given moderate cardiomegaly. Electronically Signed   By: Ilona Sorrel M.D.   On: 01/30/2020 18:07   ECHOCARDIOGRAM LIMITED  Result Date: 01/31/2020    ECHOCARDIOGRAM LIMITED REPORT   Patient Name:   Sydney Phillips Longman Date of Exam: 01/31/2020 Medical Rec #:  [id number]         Height:       65.0 in Accession #:    [id number]        Weight:       178.8 lb Date of Birth:  05/23/1936         BSA:          1.886 m Patient Age:    74 years          BP:           133/63 mmHg Patient Gender: F                 HR:           59 bpm. Exam Location:  Inpatient Procedure: Intracardiac Opacification Agent, Limited Echo, Limited Color Doppler            and Cardiac Doppler Indications:    Dyspnea 786.09 / R06.00  History:        Patient has prior history of Echocardiogram examinations, most                 recent 08/24/2019. CHF, Defibrillator, COPD, Pulmonary HTN and  PAD, Arrythmias:LBBB; Risk Factors:Former Smoker, Hypertension                 and Dyslipidemia. Chronic hypoxemic respiratory failure                 secondary to COVID-19, sepsis, chronic kidney disease.  Sonographer:    Darlina Sicilian RDCS Referring Phys: [place]  1. Left ventricular ejection fraction, by estimation, is 25 to 30%. The left ventricle has severely decreased function. The left ventricle demonstrates global hypokinesis. Left ventricular diastolic parameters are consistent with Grade II diastolic dysfunction (pseudonormalization).  Elevated left atrial pressure.  2. Right ventricular systolic function is normal. The right ventricular size is normal. There is normal pulmonary artery systolic pressure. The estimated right ventricular systolic pressure is 79.8 mmHg.  3. The mitral valve is normal in structure. Mild mitral valve regurgitation.  4. The aortic valve was not well visualized. Aortic valve regurgitation is not visualized. Mild to moderate aortic valve sclerosis/calcification is present, without any evidence of aortic stenosis.  5. The inferior vena cava is normal in size with greater than 50% respiratory variability, suggesting right atrial pressure of 3 mmHg. FINDINGS  Left Ventricle: Left ventricular ejection fraction, by estimation, is 25 to 30%. The left ventricle has severely decreased function. The left ventricle demonstrates global hypokinesis. Definity contrast agent was given IV to delineate the left ventricular endocardial borders. Elevated left atrial pressure. Right Ventricle: The right ventricular size is normal. Right ventricular systolic function is normal. There is normal pulmonary artery systolic pressure. The tricuspid regurgitant velocity is 2.53 m/s, and with an assumed right atrial pressure of 3 mmHg,  the estimated right ventricular systolic pressure is 92.1 mmHg. Pericardium: There is no evidence of pericardial effusion. Mitral Valve: The mitral valve is normal in structure. Mild mitral valve regurgitation. Tricuspid Valve: The tricuspid valve is normal in structure. Tricuspid valve regurgitation is trivial. Aortic Valve: The aortic valve was not well visualized. Aortic valve regurgitation is not visualized. Mild to moderate aortic valve sclerosis/calcification is present, without any evidence of aortic stenosis. Venous: The inferior vena cava is normal in size with greater than 50% respiratory variability, suggesting right atrial pressure of 3 mmHg. LEFT VENTRICLE PLAX 2D LVIDd:         5.70 cm      Diastology  LVIDs:         5.00 cm      LV e' lateral:   2.47 cm/s LV PW:         0.90 cm      LV E/e' lateral: 32.1 LV IVS:        1.10 cm      LV e' medial:    2.40 cm/s LVOT diam:     1.90 cm      LV E/e' medial:  33.0 LV SV:         44 LV SV Index:   23 LVOT Area:     2.84 cm  LV Volumes (MOD) LV vol d, MOD A2C: 146.0 ml LV vol d, MOD A4C: 185.5 ml LV vol s, MOD A2C: 115.0 ml LV vol s, MOD A4C: 117.5 ml LV SV MOD A2C:     31.0 ml LV SV MOD A4C:     185.5 ml LV SV MOD BP:      47.9 ml LEFT ATRIUM         Index LA diam:    3.30 cm 1.75 cm/m  AORTIC VALVE  LVOT Vmax:   67.20 cm/s LVOT Vmean:  47.900 cm/s LVOT VTI:    0.156 m MITRAL VALVE                 TRICUSPID VALVE MV Area (PHT): 3.65 cm      TR Peak grad:   25.6 mmHg MV Decel Time: 208 msec      TR Vmax:        253.00 cm/s MR Peak grad:    123.7 mmHg MR Mean grad:    64.0 mmHg   SHUNTS MR Vmax:         556.00 cm/s Systemic VTI:  0.16 m MR Vmean:        367.0 cm/s  Systemic Diam: 1.90 cm MR PISA:         1.01 cm MR PISA Eff ROA: 7 mm MR PISA Radius:  0.40 cm MV E velocity: 79.30 cm/s MV A velocity: 91.30 cm/s MV E/A ratio:  0.87 Oswaldo Milian MD Electronically signed by Oswaldo Milian MD Signature Date/Time: 01/31/2020/3:57:07 PM    Final      TODAY-DAY OF DISCHARGE:  Subjective:   [name] today has no headache,no chest abdominal pain,no new weakness tingling or numbness, feels much better wants to go home today.   Objective:   Blood pressure (!) 128/51, pulse 60, temperature (!) 97.5 F (36.4 C), temperature source Axillary, resp. rate 18, height 5\' 5"  (1.651 m), weight 76.3 kg, SpO2 95 %.  Intake/Output Summary (Last 24 hours) at 01/16/2020 1147 Last data filed at 01/28/2020 0005 Gross per 24 hour  Intake 120 ml  Output 500 ml  Net -380 ml   Filed Weights   02/08/20 0105 02/09/20 0417 01/19/2020 0406  Weight: 77.5 kg 76.7 kg 76.3 kg    Exam: Awake Alert, Oriented *3, No new F.N deficits, Normal affect [place].AT,PERRAL Supple  Neck,No JVD, No cervical lymphadenopathy appriciated.  Symmetrical Chest wall movement, Good air movement bilaterally, CTAB RRR,No Gallops,Rubs or new Murmurs, No Parasternal Heave +ve B.Sounds, Abd Soft, Non tender, No organomegaly appriciated, No rebound -guarding or rigidity. No Cyanosis, Clubbing or edema, No new Rash or bruise   PERTINENT RADIOLOGIC STUDIES: DG Chest 2 View  Result Date: 01/19/2020 CLINICAL DATA:  Fatigue.  History of CHF. EXAM: CHEST - 2 VIEW COMPARISON:  12/05/2019.  09/30/2019. FINDINGS: AICD in stable position. Again noted cardiomegaly with bilateral interstitial prominence suggesting CHF. Small bilateral pleural effusion may be present. Calcified left lung nodule consistent with granuloma again noted. No pneumothorax. Degenerative change thoracic spine. Lower thoracic spine compression fracture noted on today's exam. IMPRESSION: 1. AICD in stable position. Cardiomegaly with bilateral interstitial prominence again noted suggesting CHF. Similar findings noted on prior exam. Small bilateral pleural effusions may be present. 2. Lower thoracic vertebral body compression fracture may be present. Electronically Signed   By: Marcello Moores  Register   On: 01/19/2020 05:14   DG Chest 2 View  Result Date: 01/12/2020 CLINICAL DATA:  Chest pain. EXAM: CHEST - 2 VIEW COMPARISON:  12/05/2019. FINDINGS: AICD in stable position. Again noted cardiomegaly with pulmonary venous congestion bilateral interstitial prominence and small bilateral pleural effusions. Findings consistent CHF. Similar findings noted on prior exam. Calcified pulmonary nodules noted consistent prior granulomas disease. No pneumothorax. IMPRESSION: AICD in stable position. Cardiomegaly with pulmonary venous congestion, bilateral interstitial prominence, and small bilateral pleural effusions again noted. Findings again consistent with CHF. Similar findings noted on prior exam. Electronically Signed   By: [place]   On:  01/12/2020 05:20   DG Wrist Complete Left  Result Date: 01/12/2020 CLINICAL DATA:  Persistent left wrist pain after fall 1 month ago EXAM: LEFT WRIST - COMPLETE 3+ VIEW COMPARISON:  12/26/2019 FINDINGS: No definite acute fracture is identified. Possible subtle horizontal lucency at the distal ulna could reflect a subacute nondisplaced fracture. No definite fracture line is seen in at the radial styloid. Mildly widened scapholunate interval of 3 mm. Similar degree of osteoarthritis, most pronounced at the first [facility] and triscaphe joints. Bones are demineralized. Soft tissue swelling persists. IMPRESSION: 1. Possible subtle subacute nondisplaced fracture at the distal ulna. Correlate with point tenderness. 2. No definite acute or healing distal radial fracture is identified. 3. Mildly widened scapholunate interval of 3 mm suggesting underlying ligamentous injury. Electronically Signed   By: Davina Poke D.O.   On: 01/12/2020 08:47   DG Chest Port 1 View  Result Date: 01/31/2020 CLINICAL DATA:  Hypoxia.  Reported COVID-19 positive EXAM: PORTABLE CHEST 1 VIEW COMPARISON:  January 30, 2020 and January 19, 2020 FINDINGS: Extensive patchy interstitial and airspace opacity bilaterally is essentially stable compared to 1 day prior with significant progression of disease since 2 weeks prior. Mild consolidation in the left base is stable. There is cardiomegaly with pulmonary vascularity within normal limits. Pacemaker leads attached to right atrium, right ventricle, and coronary sinus. No adenopathy appreciable. There is aortic atherosclerosis. No bone lesions. IMPRESSION: Widespread interstitial and airspace opacity bilaterally, stable compared to 1 day prior. Mild consolidation left base. No new opacity evident. Stable cardiac prominence. Stable pacemaker lead positioning. No adenopathy appreciable. Aortic Atherosclerosis (ICD10-I70.0). Electronically Signed   By: Lowella Grip III M.D.   On: 01/31/2020 11:38    DG Chest Port 1 View  Result Date: 01/30/2020 CLINICAL DATA:  COVID-19 positive EXAM: PORTABLE CHEST 1 VIEW COMPARISON:  01/26/2020 chest radiograph. FINDINGS: Stable 3 lead left subclavian ICD. Stable cardiomediastinal silhouette with moderate cardiomegaly. No pneumothorax. No pleural effusion. Extensive patchy opacities throughout both lungs are new. IMPRESSION: New extensive patchy opacities throughout both lungs, differential includes COVID-19 pneumonia and/or cardiogenic pulmonary edema given moderate cardiomegaly. Electronically Signed   By: Ilona Sorrel M.D.   On: 01/30/2020 18:07   ECHOCARDIOGRAM LIMITED  Result Date: 01/31/2020    ECHOCARDIOGRAM LIMITED REPORT   Patient Name:   ANTONIETTE PEAKE Krehbiel Date of Exam: 01/31/2020 Medical Rec #:  [id number]         Height:       65.0 in Accession #:    [id number]        Weight:       178.8 lb Date of Birth:  10/10/1935         BSA:          1.886 m Patient Age:    48 years          BP:           133/63 mmHg Patient Gender: F                 HR:           59 bpm. Exam Location:  Inpatient Procedure: Intracardiac Opacification Agent, Limited Echo, Limited Color Doppler            and Cardiac Doppler Indications:    Dyspnea 786.09 / R06.00  History:        Patient has prior history of Echocardiogram examinations, most  recent 08/24/2019. CHF, Defibrillator, COPD, Pulmonary HTN and                 PAD, Arrythmias:LBBB; Risk Factors:Former Smoker, Hypertension                 and Dyslipidemia. Chronic hypoxemic respiratory failure                 secondary to COVID-19, sepsis, chronic kidney disease.  Sonographer:    Darlina Sicilian RDCS Referring Phys: [place]  1. Left ventricular ejection fraction, by estimation, is 25 to 30%. The left ventricle has severely decreased function. The left ventricle demonstrates global hypokinesis. Left ventricular diastolic parameters are consistent with Grade II diastolic dysfunction  (pseudonormalization). Elevated left atrial pressure.  2. Right ventricular systolic function is normal. The right ventricular size is normal. There is normal pulmonary artery systolic pressure. The estimated right ventricular systolic pressure is 62.3 mmHg.  3. The mitral valve is normal in structure. Mild mitral valve regurgitation.  4. The aortic valve was not well visualized. Aortic valve regurgitation is not visualized. Mild to moderate aortic valve sclerosis/calcification is present, without any evidence of aortic stenosis.  5. The inferior vena cava is normal in size with greater than 50% respiratory variability, suggesting right atrial pressure of 3 mmHg. FINDINGS  Left Ventricle: Left ventricular ejection fraction, by estimation, is 25 to 30%. The left ventricle has severely decreased function. The left ventricle demonstrates global hypokinesis. Definity contrast agent was given IV to delineate the left ventricular endocardial borders. Elevated left atrial pressure. Right Ventricle: The right ventricular size is normal. Right ventricular systolic function is normal. There is normal pulmonary artery systolic pressure. The tricuspid regurgitant velocity is 2.53 m/s, and with an assumed right atrial pressure of 3 mmHg,  the estimated right ventricular systolic pressure is 76.2 mmHg. Pericardium: There is no evidence of pericardial effusion. Mitral Valve: The mitral valve is normal in structure. Mild mitral valve regurgitation. Tricuspid Valve: The tricuspid valve is normal in structure. Tricuspid valve regurgitation is trivial. Aortic Valve: The aortic valve was not well visualized. Aortic valve regurgitation is not visualized. Mild to moderate aortic valve sclerosis/calcification is present, without any evidence of aortic stenosis. Venous: The inferior vena cava is normal in size with greater than 50% respiratory variability, suggesting right atrial pressure of 3 mmHg. LEFT VENTRICLE PLAX 2D LVIDd:          5.70 cm      Diastology LVIDs:         5.00 cm      LV e' lateral:   2.47 cm/s LV PW:         0.90 cm      LV E/e' lateral: 32.1 LV IVS:        1.10 cm      LV e' medial:    2.40 cm/s LVOT diam:     1.90 cm      LV E/e' medial:  33.0 LV SV:         44 LV SV Index:   23 LVOT Area:     2.84 cm  LV Volumes (MOD) LV vol d, MOD A2C: 146.0 ml LV vol d, MOD A4C: 185.5 ml LV vol s, MOD A2C: 115.0 ml LV vol s, MOD A4C: 117.5 ml LV SV MOD A2C:     31.0 ml LV SV MOD A4C:     185.5 ml LV SV MOD BP:      47.9  ml LEFT ATRIUM         Index LA diam:    3.30 cm 1.75 cm/m  AORTIC VALVE LVOT Vmax:   67.20 cm/s LVOT Vmean:  47.900 cm/s LVOT VTI:    0.156 m MITRAL VALVE                 TRICUSPID VALVE MV Area (PHT): 3.65 cm      TR Peak grad:   25.6 mmHg MV Decel Time: 208 msec      TR Vmax:        253.00 cm/s MR Peak grad:    123.7 mmHg MR Mean grad:    64.0 mmHg   SHUNTS MR Vmax:         556.00 cm/s Systemic VTI:  0.16 m MR Vmean:        367.0 cm/s  Systemic Diam: 1.90 cm MR PISA:         1.01 cm MR PISA Eff ROA: 7 mm MR PISA Radius:  0.40 cm MV E velocity: 79.30 cm/s MV A velocity: 91.30 cm/s MV E/A ratio:  0.87 Oswaldo Milian MD Electronically signed by Oswaldo Milian MD Signature Date/Time: 01/31/2020/3:57:07 PM    Final      PERTINENT LAB RESULTS: CBC: Recent Labs    02/09/20 1050 01/17/2020 0256  WBC 23.1* 18.1*  HGB 16.4* 15.3*  HCT 48.4* 45.4  PLT 262 239   CMET CMP     Component Value Date/Time   NA 134 (L) 01/28/2020 0256   NA 144 11/16/2019 1407   K 4.4 02/09/2020 0256   CL 91 (L) 02/08/2020 0256   CO2 28 01/30/2020 0256   GLUCOSE 127 (H) 01/29/2020 0256   BUN 112 (H) 02/11/2020 0256   BUN 43 (H) 11/16/2019 1407   CREATININE 2.40 (H) 01/27/2020 0256   CREATININE 1.05 (H) 08/07/2016 1433   CALCIUM 9.7 02/12/2020 0256   PROT 6.2 (L) 01/27/2020 0256   ALBUMIN 2.7 (L) 02/14/2020 0256   AST 20 01/18/2020 0256   ALT 23 02/05/2020 0256   ALKPHOS 66 02/04/2020 0256   BILITOT 1.1  01/27/2020 0256   GFRNONAA 18 (L) 02/09/2020 0256   GFRAA 21 (L) 02/04/2020 0256    GFR Estimated Creatinine Clearance: 18.5 mL/min (A) (by C-G formula based on SCr of 2.4 mg/dL (H)). No results for input(s): LIPASE, AMYLASE in the last 72 hours. No results for input(s): CKTOTAL, CKMB, CKMBINDEX, TROPONINI in the last 72 hours. Invalid input(s): POCBNP Recent Labs    02/08/20 0801 02/09/20 0156  DDIMER 1.27* 1.27*   No results for input(s): HGBA1C in the last 72 hours. No results for input(s): CHOL, HDL, LDLCALC, TRIG, CHOLHDL, LDLDIRECT in the last 72 hours. No results for input(s): TSH, T4TOTAL, T3FREE, THYROIDAB in the last 72 hours.  Invalid input(s): FREET3 No results for input(s): VITAMINB12, FOLATE, FERRITIN, TIBC, IRON, RETICCTPCT in the last 72 hours. Coags: No results for input(s): INR in the last 72 hours.  Invalid input(s): PT Microbiology: Recent Results (from the past 240 hour(s))  MRSA PCR Screening     Status: Abnormal   Collection Time: 02/05/20  5:47 AM   Specimen: Nasal Mucosa; Nasopharyngeal  Result Value Ref Range Status   MRSA by PCR POSITIVE (A) NEGATIVE Final    Comment:        The GeneXpert MRSA Assay (FDA approved for NASAL specimens only), is one component of a comprehensive MRSA colonization surveillance program. It is not intended to diagnose MRSA infection nor  to guide or monitor treatment for MRSA infections. RESULT CALLED TO, READ BACK BY AND VERIFIED WITH: LIGHTNER,N RN 02/05/2020 AT 8675 SKEEN,P Performed at [facility], [place] [address], [place], [place] [address]     FURTHER DISCHARGE INSTRUCTIONS:  Get Medicines reviewed and adjusted: Please take all your medications with you for your next visit with your Primary MD  Laboratory/radiological data: Please request your Primary MD to go over all hospital tests and procedure/radiological results at the follow up, please ask your Primary MD to get all Hospital records sent to  his/her office.  In some cases, they will be blood work, cultures and biopsy results pending at the time of your discharge. Please request that your primary care M.D. goes through all the records of your hospital data and follows up on these results.  Also Note the following: If you experience worsening of your admission symptoms, develop shortness of breath, life threatening emergency, suicidal or homicidal thoughts you must seek medical attention immediately by calling 911 or calling your MD immediately  if symptoms less severe.  You must read complete instructions/literature along with all the possible adverse reactions/side effects for all the Medicines you take and that have been prescribed to you. Take any new Medicines after you have completely understood and accpet all the possible adverse reactions/side effects.   Do not drive when taking Pain medications or sleeping medications (Benzodaizepines)  Do not take more than prescribed Pain, Sleep and Anxiety Medications. It is not advisable to combine anxiety,sleep and pain medications without talking with your primary care practitioner  Special Instructions: If you have smoked or chewed Tobacco  in the last 2 yrs please stop smoking, stop any regular Alcohol  and or any Recreational drug use.  Wear Seat belts while driving.  Please note: You were cared for by a hospitalist during your hospital stay. Once you are discharged, your primary care physician will handle any further medical issues. Please note that NO REFILLS for any discharge medications will be authorized once you are discharged, as it is imperative that you return to your primary care physician (or establish a relationship with a primary care physician if you do not have one) for your post hospital discharge needs so that they can reassess your need for medications and monitor your lab values.  Total Time spent coordinating discharge including counseling, education and face to  face time equals 45 minutes.  SignedOren Binet 02/09/2020 11:47 AM

## 2020-02-11 ENCOUNTER — Other Ambulatory Visit (HOSPITAL_COMMUNITY): Payer: Self-pay

## 2020-02-11 LAB — CBC WITH DIFFERENTIAL/PLATELET
Abs Immature Granulocytes: 0.22 10*3/uL — ABNORMAL HIGH (ref 0.00–0.07)
Basophils Absolute: 0 10*3/uL (ref 0.0–0.1)
Basophils Relative: 0 %
Eosinophils Absolute: 0 10*3/uL (ref 0.0–0.5)
Eosinophils Relative: 0 %
HCT: 46.9 % — ABNORMAL HIGH (ref 36.0–46.0)
Hemoglobin: 15.5 g/dL — ABNORMAL HIGH (ref 12.0–15.0)
Immature Granulocytes: 1 %
Lymphocytes Relative: 2 %
Lymphs Abs: 0.3 10*3/uL — ABNORMAL LOW (ref 0.7–4.0)
MCH: 31 pg (ref 26.0–34.0)
MCHC: 33 g/dL (ref 30.0–36.0)
MCV: 93.8 fL (ref 80.0–100.0)
Monocytes Absolute: 0.8 10*3/uL (ref 0.1–1.0)
Monocytes Relative: 4 %
Neutro Abs: 17.2 10*3/uL — ABNORMAL HIGH (ref 1.7–7.7)
Neutrophils Relative %: 93 %
Platelets: 230 10*3/uL (ref 150–400)
RBC: 5 MIL/uL (ref 3.87–5.11)
RDW: 14.8 % (ref 11.5–15.5)
WBC: 18.6 10*3/uL — ABNORMAL HIGH (ref 4.0–10.5)
nRBC: 0 % (ref 0.0–0.2)

## 2020-02-11 LAB — COMPREHENSIVE METABOLIC PANEL
ALT: 31 U/L (ref 0–44)
AST: 24 U/L (ref 15–41)
Albumin: 2.6 g/dL — ABNORMAL LOW (ref 3.5–5.0)
Alkaline Phosphatase: 60 U/L (ref 38–126)
Anion gap: 14 (ref 5–15)
BUN: 96 mg/dL — ABNORMAL HIGH (ref 8–23)
CO2: 29 mmol/L (ref 22–32)
Calcium: 9.9 mg/dL (ref 8.9–10.3)
Chloride: 95 mmol/L — ABNORMAL LOW (ref 98–111)
Creatinine, Ser: 2.03 mg/dL — ABNORMAL HIGH (ref 0.44–1.00)
GFR calc Af Amer: 26 mL/min — ABNORMAL LOW (ref >60)
GFR calc non Af Amer: 22 mL/min — ABNORMAL LOW (ref >60)
Glucose, Bld: 186 mg/dL — ABNORMAL HIGH (ref 70–99)
Potassium: 4.7 mmol/L (ref 3.5–5.1)
Sodium: 138 mmol/L (ref 135–145)
Total Bilirubin: 0.9 mg/dL (ref 0.3–1.2)
Total Protein: 6.1 g/dL — ABNORMAL LOW (ref 6.5–8.1)

## 2020-02-11 LAB — PHOSPHORUS: Phosphorus: 4.3 mg/dL (ref 2.5–4.6)

## 2020-02-11 LAB — PROTIME-INR
INR: 1 (ref 0.8–1.2)
Prothrombin Time: 12.7 seconds (ref 11.4–15.2)

## 2020-02-11 LAB — MAGNESIUM: Magnesium: 2.7 mg/dL — ABNORMAL HIGH (ref 1.7–2.4)

## 2020-02-11 NOTE — Consult Note (Signed)
Referring Physician: Dr. Ruthann Cancer, PA  Sydney Phillips is an 84 y.o. female.                       Chief Complaint: Non-sustained VT  HPI: 84 years old white female with PMH of chronic systolic left heart failure, S/P CRT, CAD, h/o ventricular tachycardia, COPD on home oxygen, Hypertension, Type 2 DM, CKD, IIIb and COVID 19 pneumonia with lung fibrosis has recurrent non-sustained VT.  EKG shows Bi-V pacing. Recent echocardiogram showed poor LV systolic function with mild MR and preserved RV systolic function.  Past Medical History:  Diagnosis Date  . Arthritis   . CAD (coronary artery disease)    a. s/p DES to LCx/RCA 05/2016, ostial LAD disease.  . Cardiomyopathy ([place])   . Chronic back pain   . Chronic combined systolic and diastolic CHF (congestive heart failure) ([place])   . CKD (chronic kidney disease), stage III   . Colon polyps 2003.  2015.   HP polyps 2003.  adnomas 2015.  required ref[facility] for [facility] resection of flat polyps.   Marland Kitchen COPD (chronic obstructive pulmonary disease) ([place])   . Dementia ([place])   . Depression with anxiety    takes Cymbalta daily  . Diabetes mellitus ([place])   . Early cataracts, bilateral   . Fibromyalgia   . GERD (gastroesophageal reflux disease)    was on meds but was taken off;now watches what she eats  . Hemorrhoids   . History of kidney stones   . HOH (hard of hearing)   . Hx of colonic polyps   . Hyperlipemia    takes Crestor daily  . Hypertension    takes Amlodipine and Metoprolol daily  . ICD (implantable cardioverter-defibrillator) discharge 01/12/2020  . Insomnia   . LBBB (left bundle branch block)    Stress test 09/03/2010, EF 55  . PAD (peripheral artery disease) (HCC)    Carotid, subclavian, and lower extremity beds, currently not symptomatic  . Presence of combination internal cardiac defibrillator (ICD) and pacemaker   . Pulmonary hypertension ([place])   . Subclavian arterial stenosis, lt, with PTA/STENT 07/31/11  08/01/2011  . Vertigo    takes Meclizine prn      Past Surgical History:  Procedure Laterality Date  . ABDOMINAL AORTAGRAM N/A 08/12/2013   Procedure: ABDOMINAL Maxcine Ham;  Surgeon: Elam Dutch, MD;  Location: [facility] CATH LAB;  Service: Cardiovascular;  Laterality: N/A;  . ABDOMINAL HYSTERECTOMY    . APPENDECTOMY    . BACK SURGERY  2012  . BIV ICD GENERTAOR CHANGE OUT Left 02/20/2012   Procedure: BIV ICD GENERTAOR CHANGE OUT;  Surgeon: [name], MD;  Location: [facility] CATH LAB;  Service: Cardiovascular;  Laterality: Left;  . CARDIAC CATHETERIZATION  12/01/2007   By Dr. Melvern Banker, left heart cath,   . CARDIAC CATHETERIZATION N/A 05/20/2016   Procedure: Right/Left Heart Cath and Coronary Angiography;  Surgeon: Sherren Mocha, MD;  Location: [place] CV LAB;  Service: Cardiovascular;  Laterality: N/A;  . CARDIAC CATHETERIZATION N/A 05/20/2016   Procedure: Coronary Stent Intervention;  Surgeon: Sherren Mocha, MD;  Location: [place] CV LAB;  Service: Cardiovascular;  Laterality: N/A;  . CARDIAC DEFIBRILLATOR PLACEMENT  05/2008   By Dr Blanch Media, [employer] CANNOT HAVE MRI's  . CAROTID ANGIOGRAM N/A 07/31/2011   Procedure: CAROTID ANGIOGRAM;  Surgeon: Lorretta Harp, MD;  Location: [facility] CATH LAB;  Service: Cardiovascular;  Laterality: N/A;  carotid angiogram and possible Lt SCA PTA  .  COLONOSCOPY W/ POLYPECTOMY  12/2013  . CORONARY ANGIOPLASTY    . ENDARTERECTOMY Left 11/08/2014   Procedure: LEFT CAROTID ENDARTERECTOMY WITH HEMASHIELD PATCH ANGIOPLASTY;  Surgeon: Elam Dutch, MD;  Location: [place];  Service: Vascular;  Laterality: Left;  . ESOPHAGOGASTRODUODENOSCOPY N/A 01/20/2014   Procedure: ESOPHAGOGASTRODUODENOSCOPY (EGD);  Surgeon: Jerene Bears, MD;  Location: [facility] ENDOSCOPY;  Service: Endoscopy;  Laterality: N/A;  . FEMORAL-POPLITEAL BYPASS GRAFT Right 10/12/2013   Procedure:   Femoral-Peroneal trunk  bypass with nonreversed greater saphenous vein graft;  Surgeon: Elam Dutch, MD;   Location: [place];  Service: Vascular;  Laterality: Right;  . GIVENS CAPSULE STUDY N/A 01/20/2014   Procedure: GIVENS CAPSULE STUDY;  Surgeon: Jerene Bears, MD;  Location: [place];  Service: Gastroenterology;  Laterality: N/A;  . ICD GENERATOR CHANGEOUT N/A 03/04/2017   Procedure: ICD Generator Changeout;  Surgeon: [name], MD;  Location: [place] CV LAB;  Service: Cardiovascular;  Laterality: N/A;  . INSERT / REPLACE / REMOVE PACEMAKER    . INTRAOPERATIVE ARTERIOGRAM Right 10/12/2013   Procedure: INTRA OPERATIVE ARTERIOGRAM;  Surgeon: Elam Dutch, MD;  Location: [place];  Service: Vascular;  Laterality: Right;  . LEFT HEART CATH AND CORONARY ANGIOGRAPHY N/A 10/24/2019   Procedure: LEFT HEART CATH AND CORONARY ANGIOGRAPHY;  Surgeon: Belva Crome, MD;  Location: [place] CV LAB;  Service: Cardiovascular;  Laterality: N/A;  . ORIF ELBOW FRACTURE  08/16/2011   Procedure: OPEN REDUCTION INTERNAL FIXATION (ORIF) ELBOW/OLECRANON FRACTURE;  Surgeon: Schuyler Amor, MD;  Location: [place];  Service: Orthopedics;  Laterality: Left;  . RENAL ANGIOGRAM N/A 08/12/2013   Procedure: RENAL ANGIOGRAM;  Surgeon: Elam Dutch, MD;  [facility] CATH LAB;  Service: Cardiovascular;  Laterality: N/A;  . SUBCLAVIAN STENT PLACEMENT Left 07/31/2011   7x18 Genesis, balloon, with reduction of 90% ostial left subclavian artery stenosis to 0% with residual excellent flow  . TONSILLECTOMY    . TUBAL LIGATION      Family History  Problem Relation Age of Onset  . CAD Father   . Heart disease Father   . Hyperlipidemia Father   . Heart disease Mother   . Deep vein thrombosis Son   . Hyperlipidemia Other   . Colon cancer Maternal Grandmother   . Cancer Sister        ovarian  . Diabetes Sister   . Heart disease Sister   . Anesthesia problems Neg Hx   . Hypotension Neg Hx   . Malignant hyperthermia Neg Hx   . Pseudochol deficiency Neg Hx    Social History:  reports that she quit smoking about 2  years ago. Her smoking use included cigarettes. She has a 31.00 pack-year smoking history. She has never used smokeless tobacco. She reports that she does not drink alcohol and does not use drugs.  Allergies:  Allergies  Allergen Reactions  . Potassium-Containing Compounds Other (See Comments)    Causes severe constipation  . Neomycin-Polymyxin B Gu     Swollen throat   . Other Other (See Comments)    "mycin" antibiotics cause sleeplessness and itching  . Azithromycin Rash  . Codeine Itching  . Darvon Itching  . Erythromycin Rash  . Meloxicam Other (See Comments)    Unknown    . Norco [Hydrocodone-Acetaminophen] Itching  . Penicillins Rash and Other (See Comments)    Has patient had a PCN reaction causing immediate rash, facial/tongue/throat swelling, SOB or lightheadedness with hypotension: unknown Has patient had a  PCN reaction causing severe rash involving mucus membranes or skin necrosis: no Has patient had a PCN reaction that required hospitalization: unknown Has patient had a PCN reaction occurring within the last 10 years: no If all of the above answers are "NO", then may proceed with Cephalosporin use.   Marland Kitchen Propoxyphene N-Acetaminophen Itching  . Rofecoxib Other (See Comments)    Unknown    . Rosuvastatin Other (See Comments)    cramps  . Statins Itching and Other (See Comments)    Sleeplessness Sleeplessness  . Sulfa Antibiotics Rash    Medications Prior to Admission  Medication Sig Dispense Refill  . ACCU-CHEK FASTCLIX LANCETS MISC USE   TO CHECK GLUCOSE ONCE DAILY (Patient taking differently: 1 each by Other route daily. ) 102 each 5  . ACCU-CHEK SMARTVIEW test strip USE  STRIP TO CHECK GLUCOSE ONCE DAILY (Patient taking differently: 1 each by Other route daily. ) 100 each 0  . albuterol (PROVENTIL) (2.5 MG/3ML) 0.083% nebulizer solution USE 1 VIAL IN NEBULIZER EVERY 4 HOURS AS NEEDED FOR WHEEZING FOR SHORTNESS OF BREATH (Patient taking differently: Take 2.5 mg by  nebulization every 4 (four) hours as needed for wheezing or shortness of breath. ) 225 mL 0  . allopurinol (ZYLOPRIM) 100 MG tablet Take 2 tablets (200 mg total) by mouth daily.    Marland Kitchen ALPRAZolam (XANAX) 1 MG tablet TAKE 1 TABLET BY MOUTH IN THE MORNING AND 1 & 1/2 (ONE & ONE-HALF) NIGHTLY AS NEEDED FOR ANXIETY (Patient taking differently: Take 1-1.5 mg by mouth See admin instructions. Take 1 tablet (1mg ) by mouth daily in the morning and 1 and 1/2 tablet (1.5mg ) by mough every night as needed for anxiety.) 75 tablet 5  . Ascorbic Acid (VITAMIN C PO) Take 1 tablet by mouth in the morning and at bedtime.    Marland Kitchen aspirin 81 MG chewable tablet Chew 1 tablet (81 mg total) by mouth daily. 30 tablet 1  . azelastine (ASTELIN) 0.1 % nasal spray USE 2 SPRAY(S) IN EACH NOSTRIL TWICE DAILY AS DIRECTED (Patient taking differently: Place 2 sprays into both nostrils 2 (two) times daily. ) 30 mL 0  . bisoprolol (ZEBETA) 5 MG tablet Take 1 tablet (5 mg total) by mouth daily. 30 tablet 1  . Cholecalciferol (VITAMIN D) 50 MCG (2000 UT) CAPS Take 4,000 Units by mouth daily.     . clopidogrel (PLAVIX) 75 MG tablet Take 1 tablet by mouth once daily (Patient taking differently: Take 75 mg by mouth daily. ) 90 tablet 3  . Cyanocobalamin 5000 MCG TBDP Take 5,000 mcg by mouth daily.     . DULoxetine (CYMBALTA) 60 MG capsule Take 1 capsule by mouth twice daily (Patient taking differently: Take 60 mg by mouth 2 (two) times daily. ) 180 capsule 0  . enoxaparin (LOVENOX) 30 MG/0.3ML injection Inject 0.3 mLs (30 mg total) into the skin daily. 0 mL   . insulin aspart (NOVOLOG) 100 UNIT/ML injection Correction coverage: Moderate (average weight, post-op) CBG < 70: Implement Hypoglycemia Standing Orders and refer to Hypoglycemia Standing Orders sidebar report CBG 70 - 120: 0 units CBG 121 - 150: 2 units CBG 151 - 200: 3 units CBG 201 - 250: 5 units CBG 251 - 300: 8 units CBG 301 - 350: 11 units CBG 351 - 400: 15 units CBG > 400:  call MD and obtain STAT lab verification 10 mL 11  . insulin glargine (LANTUS) 100 UNIT/ML injection Inject 0.18 mLs (18 Units total) into the skin  daily. 10 mL 11  . Ipratropium-Albuterol (COMBIVENT) 20-100 MCG/ACT AERS respimat Inhale 1 puff into the lungs 3 (three) times daily.    . isosorbide mononitrate (IMDUR) 30 MG 24 hr tablet Take 1 tablet (30 mg total) by mouth daily.    Marland Kitchen loratadine (CLARITIN) 10 MG tablet Take 10 mg by mouth daily.    . magnesium oxide (MAG-OX) 400 (241.3 Mg) MG tablet Take 1 tablet (400 mg total) by mouth daily. 30 tablet 0  . nitroGLYCERIN (NITROSTAT) 0.4 MG SL tablet Place 1 tablet (0.4 mg total) under the tongue every 5 (five) minutes as needed for chest pain. X 3 doses (Patient taking differently: Place 0.4 mg under the tongue every 5 (five) minutes x 3 doses as needed for chest pain. ) 25 tablet 3  . pantoprazole (PROTONIX) 40 MG tablet Take 1 tablet (40 mg total) by mouth 2 (two) times daily. 30 tablet 0  . potassium chloride SA (KLOR-CON) 20 MEQ tablet Take 1 tablet (20 mEq total) by mouth daily. 30 tablet 3  . [START ON 02/19/2020] predniSONE (DELTASONE) 10 MG tablet Take 3 tablets (30 mg total) by mouth daily with breakfast for 7 doses. 21 tablet 0  . [START ON 03/04/2020] predniSONE (DELTASONE) 10 MG tablet Take 1 tablet (10 mg total) by mouth daily with breakfast for 7 doses. 7 tablet 0  . [START ON 02/12/2020] predniSONE (DELTASONE) 20 MG tablet Take 2 tablets (40 mg total) by mouth daily with breakfast for 7 days. 14 tablet 0  . [START ON 02/26/2020] predniSONE (DELTASONE) 20 MG tablet Take 1 tablet (20 mg total) by mouth daily with breakfast for 7 doses. 7 tablet 0  . [START ON 03/11/2020] predniSONE (DELTASONE) 5 MG tablet Take 1 tablet (5 mg total) by mouth daily with breakfast for 3 doses. 3 tablet 0  . predniSONE (DELTASONE) 50 MG tablet Take 1 tablet (50 mg total) by mouth daily with breakfast for 5 doses. 50 mg, Oral, Daily with breakfast, First dose on Tue  02/07/20 at 0800, For 5 doses    . PROAIR HFA 108 (90 Base) MCG/ACT inhaler INHALE 2 PUFFS BY MOUTH EVERY 4 HOURS AS NEEDED FOR WHEEZING AND FOR SHORTNESS OF BREATH (Patient taking differently: Inhale 2 puffs into the lungs every 4 (four) hours as needed for wheezing or shortness of breath. ) 27 g 0    Results for orders placed or performed during the hospital encounter of 01/30/2020 (from the past 48 hour(s))  Blood gas, arterial     Status: Abnormal   Collection Time: 01/18/2020  6:00 PM  Result Value Ref Range   FIO2 100.00    pH, Arterial 7.521 (H) 7.35 - 7.45   pCO2 arterial 33.8 32 - 48 mmHg   pO2, Arterial 57.4 (L) 83 - 108 mmHg   Bicarbonate 27.8 20.0 - 28.0 mmol/L   Acid-Base Excess 4.6 (H) 0.0 - 2.0 mmol/L   O2 Saturation 92.0 %   Patient temperature 36.0    Collection site RIGHT RADIAL    Drawn by COLLECTED BY RT     Comment: M.MANUAL   Sample type ARTERIAL DRAW    Allens test (pass/fail) PASS PASS    Comment: Performed at [facility], [place] [address] [place], [place] [address]  Comprehensive metabolic panel     Status: Abnormal   Collection Time: 02/11/20  6:10 AM  Result Value Ref Range   Sodium 138 135 - 145 mmol/L   Potassium 4.7 3.5 - 5.1  mmol/L   Chloride 95 (L) 98 - 111 mmol/L   CO2 29 22 - 32 mmol/L   Glucose, Bld 186 (H) 70 - 99 mg/dL    Comment: Glucose reference range applies only to samples taken after fasting for at least 8 hours.   BUN 96 (H) 8 - 23 mg/dL   Creatinine, Ser 2.03 (H) 0.44 - 1.00 mg/dL   Calcium 9.9 8.9 - 10.3 mg/dL   Total Protein 6.1 (L) 6.5 - 8.1 g/dL   Albumin 2.6 (L) 3.5 - 5.0 g/dL   AST 24 15 - 41 U/L   ALT 31 0 - 44 U/L   Alkaline Phosphatase 60 38 - 126 U/L   Total Bilirubin 0.9 0.3 - 1.2 mg/dL   GFR calc non Af Amer 22 (L) >60 mL/min   GFR calc Af Amer 26 (L) >60 mL/min   Anion gap 14 5 - 15    Comment: Performed at [facility] [address], [place], [place] [address]  CBC with Differential/Platelet     Status:  Abnormal   Collection Time: 02/11/20  6:10 AM  Result Value Ref Range   WBC 18.6 (H) 4.0 - 10.5 K/uL   RBC 5.00 3.87 - 5.11 MIL/uL   Hemoglobin 15.5 (H) 12.0 - 15.0 g/dL   HCT 46.9 (H) 36 - 46 %   MCV 93.8 80.0 - 100.0 fL   MCH 31.0 26.0 - 34.0 pg   MCHC 33.0 30.0 - 36.0 g/dL   RDW 14.8 11.5 - 15.5 %   Platelets 230 150 - 400 K/uL   nRBC 0.0 0.0 - 0.2 %   Neutrophils Relative % 93 %   Neutro Abs 17.2 (H) 1.7 - 7.7 K/uL   Lymphocytes Relative 2 %   Lymphs Abs 0.3 (L) 0.7 - 4.0 K/uL   Monocytes Relative 4 %   Monocytes Absolute 0.8 0 - 1 K/uL   Eosinophils Relative 0 %   Eosinophils Absolute 0.0 0 - 0 K/uL   Basophils Relative 0 %   Basophils Absolute 0.0 0 - 0 K/uL   Immature Granulocytes 1 %   Abs Immature Granulocytes 0.22 (H) 0.00 - 0.07 K/uL    Comment: Performed at [facility] [address], [place], [place] [address]  Protime-INR     Status: None   Collection Time: 02/11/20  6:10 AM  Result Value Ref Range   Prothrombin Time 12.7 11.4 - 15.2 seconds   INR 1.0 0.8 - 1.2    Comment: (NOTE) INR goal varies based on device and disease states. Performed at [facility], [place] [address] [place], [place] [address]   Magnesium     Status: Abnormal   Collection Time: 02/11/20  6:10 AM  Result Value Ref Range   Magnesium 2.7 (H) 1.7 - 2.4 mg/dL    Comment: Performed at [facility] [address] [place], [place] [address]  Phosphorus     Status: None   Collection Time: 02/11/20  6:10 AM  Result Value Ref Range   Phosphorus 4.3 2.5 - 4.6 mg/dL    Comment: Performed at [facility] [address], [place], [place] [address]   *Note: Due to a large number of results and/or encounters for the requested time period, some results have not been displayed. A complete set of results can be found in Results Review.   DG CHEST PORT 1 VIEW  Result Date: 02/11/2020 CLINICAL DATA:  Respiratory failure.  COVID-19 pneumonia. EXAM: PORTABLE CHEST 1 VIEW  COMPARISON:  01/31/2020 FINDINGS: Left chest wall ICD is noted with leads in the right atrial appendage and right ventricle. Stable cardiomediastinal contours. No pleural effusion identified. Diffuse, coarsened reticular interstitial markings are again noted bilaterally. Peripheral opacities within the left midlung and both lung bases are again noted. When compared with 01/31/2020 there is been improved aeration to both upper lobes and the right midlung. IMPRESSION: Interval improvement in aeration of both upper lung zones and right midlung. Electronically Signed   By: Kerby Moors M.D.   On: 02/11/2020 08:30    Review Of Systems Constitutional: Positive fever, chills, weight loss. Eyes: No vision change, wears glasses. No discharge or pain. Ears: No hearing loss, No tinnitus. Respiratory: No asthma, Positive COPD, pneumonias, shortness of breath. No hemoptysis. Cardiovascular: Positive chest pain, palpitation, leg edema. Gastrointestinal: No nausea, vomiting, diarrhea, constipation. No GI bleed. No hepatitis. Genitourinary: No dysuria, hematuria, kidney stone. No incontinance. Positive CKD Neurological: No headache, stroke, seizures.  Psychiatry: No psych facility admission for anxiety, depression, suicide. No detox. Skin: No rash. Musculoskeletal: Positive joint pain, neck pain, back pain. Lymphadenopathy: No lymphadenopathy. Hematology: Positive anemia or easy bruising.   There were no vitals taken for this visit. There is no height or weight on file to calculate BMI. General appearance: cooperative, appears stated age and mild respiratory distress Head: Normocephalic, atraumatic. Eyes: Blue eyes, pink conjunctiva, corneas clear.  Neck: No adenopathy, no carotid bruit, no JVD, supple, symmetrical, trachea midline and thyroid not enlarged. Resp: Fine crackles to auscultation bilaterally. Cardio: Regular rate and rhythm, S1, S2 normal, II/VI systolic murmur, no click, rub or gallop GI:  Soft, non-tender. Extremities: No edema, cyanosis or clubbing. Skin: Warm and dry.  Neurologic: Alert and oriented X 1, normal strength.  Assessment/Plan Non-sustained VT Bi-V pacemaker Chronic systolic left heart failure CAD COPD COVID-19 pneumonia with pulmonary fibrosis Type 2 DM HTN CKD, IIIb  Continue supplemental oxygen. Change Bisoprolol (non-vasodilating) to Carvedilol(vasodilating) 6.25 mg. twice daily and increase dose as tolerated. Will try to avoid amiodarone as much as possible due to pulmonary fibrosis.  Time spent: Review of old records, Lab, x-rays, EKG, other cardiac tests, examination, discussion with patient, nurse and PA over 70 minutes.  Birdie Riddle, MD  02/11/2020, 10:38 AM

## 2020-02-12 LAB — BASIC METABOLIC PANEL
Anion gap: 10 (ref 5–15)
BUN: 80 mg/dL — ABNORMAL HIGH (ref 8–23)
CO2: 27 mmol/L (ref 22–32)
Calcium: 9.3 mg/dL (ref 8.9–10.3)
Chloride: 95 mmol/L — ABNORMAL LOW (ref 98–111)
Creatinine, Ser: 1.82 mg/dL — ABNORMAL HIGH (ref 0.44–1.00)
GFR calc Af Amer: 29 mL/min — ABNORMAL LOW (ref >60)
GFR calc non Af Amer: 25 mL/min — ABNORMAL LOW (ref >60)
Glucose, Bld: 102 mg/dL — ABNORMAL HIGH (ref 70–99)
Potassium: 4.2 mmol/L (ref 3.5–5.1)
Sodium: 132 mmol/L — ABNORMAL LOW (ref 135–145)

## 2020-02-12 NOTE — Consult Note (Signed)
Ref: Laurey Morale, MD   Subjective:  Awake. VS stable. Mostly paced rhythm. Decreased episodes of non-sustained VT  Objective:  Vital Signs in the last 24 hours:  P: 67, R: 16, BP 109/60.  Physical Exam: BP Readings from Last 1 Encounters:  02/01/2020 (!) 128/51     Wt Readings from Last 1 Encounters:  01/25/2020 76.3 kg    Weight change:  There is no height or weight on file to calculate BMI. HEENT: [place]/AT, Eyes-Blue, Conjunctiva-Pink, Sclera-Non-icteric Neck: No JVD, No bruit, Trachea midline. Lungs:  Clear, Bilateral. Cardiac:  Regular paced rhythm, normal S1 and S2, no S3. II/VI systolic murmur. Abdomen:  Soft, non-tender. BS present. Extremities:  No edema present. No cyanosis. No clubbing. CNS: AxOx1, Cranial nerves grossly intact, moves all 4 extremities.  Skin: Warm and dry.   Intake/Output from previous day: No intake/output data recorded.    Lab Results: BMET    Component Value Date/Time   NA 132 (L) 02/12/2020 0425   NA 138 02/11/2020 0610   NA 134 (L) 01/27/2020 0256   NA 144 11/16/2019 1407   NA 138 11/10/2019 1024   NA 136 11/07/2019 1526   K 4.2 02/12/2020 0425   K 4.7 02/11/2020 0610   K 4.4 01/22/2020 0256   CL 95 (L) 02/12/2020 0425   CL 95 (L) 02/11/2020 0610   CL 91 (L) 01/23/2020 0256   CO2 27 02/12/2020 0425   CO2 29 02/11/2020 0610   CO2 28 01/18/2020 0256   GLUCOSE 102 (H) 02/12/2020 0425   GLUCOSE 186 (H) 02/11/2020 0610   GLUCOSE 127 (H) 01/20/2020 0256   BUN 80 (H) 02/12/2020 0425   BUN 96 (H) 02/11/2020 0610   BUN 112 (H) 02/12/2020 0256   BUN 43 (H) 11/16/2019 1407   BUN 63 (H) 11/10/2019 1024   BUN 57 (H) 11/07/2019 1526   CREATININE 1.82 (H) 02/12/2020 0425   CREATININE 2.03 (H) 02/11/2020 0610   CREATININE 2.40 (H) 01/23/2020 0256   CREATININE 1.05 (H) 08/07/2016 1433   CREATININE 1.12 (H) 01/10/2013 1745   CREATININE 0.97 12/02/2010 1659   CALCIUM 9.3 02/12/2020 0425   CALCIUM 9.9 02/11/2020 0610   CALCIUM 9.7  01/18/2020 0256   GFRNONAA 25 (L) 02/12/2020 0425   GFRNONAA 22 (L) 02/11/2020 0610   GFRNONAA 18 (L) 01/18/2020 0256   GFRAA 29 (L) 02/12/2020 0425   GFRAA 26 (L) 02/11/2020 0610   GFRAA 21 (L) 01/20/2020 0256   CBC    Component Value Date/Time   WBC 18.6 (H) 02/11/2020 0610   RBC 5.00 02/11/2020 0610   HGB 15.5 (H) 02/11/2020 0610   HGB 13.6 02/25/2017 1419   HCT 46.9 (H) 02/11/2020 0610   HCT 40.9 02/25/2017 1419   PLT 230 02/11/2020 0610   PLT 294 02/25/2017 1419   MCV 93.8 02/11/2020 0610   MCV 91 02/25/2017 1419   MCH 31.0 02/11/2020 0610   MCHC 33.0 02/11/2020 0610   RDW 14.8 02/11/2020 0610   RDW 16.2 (H) 02/25/2017 1419   LYMPHSABS 0.3 (L) 02/11/2020 0610   MONOABS 0.8 02/11/2020 0610   EOSABS 0.0 02/11/2020 0610   BASOSABS 0.0 02/11/2020 0610   HEPATIC Function Panel Recent Labs    02/09/20 1050 02/08/2020 0256 02/11/20 0610  PROT 5.5* 6.2* 6.1*   HEMOGLOBIN A1C No components found for: HGA1C,  MPG CARDIAC ENZYMES Lab Results  Component Value Date   CKTOTAL 35 (L) 05/19/2016   CKMB 4.4 (H) 07/28/2011   TROPONINI <  0.03 12/18/2016   TROPONINI <0.03 12/17/2016   TROPONINI <0.03 12/17/2016   BNP No results for input(s): PROBNP in the last 8760 hours. TSH Recent Labs    10/22/19 0325 12/08/19 0413 01/14/20 0231  TSH 6.117* 11.855* 11.651*   CHOLESTEROL Recent Labs    01/14/20 0231  CHOL 300*    Scheduled Meds: Continuous Infusions: PRN Meds:.  Assessment/Plan: Non-sustained VT Bi-V pacemaker Chronic systolic left heart failure, HFrEF CAD COPD CKD, IIIb COVID-19 pneumonia with pulmonary fibrosis Type 2 DM  Continue medical treatment.   LOS: 0 days   Time spent including chart review, lab review, examination, discussion with patient and nurse : 30 min   Dixie Dials  MD  02/12/2020, 5:56 PM

## 2020-02-13 ENCOUNTER — Other Ambulatory Visit (HOSPITAL_COMMUNITY): Payer: Self-pay

## 2020-02-13 ENCOUNTER — Other Ambulatory Visit: Payer: Self-pay

## 2020-02-13 DIAGNOSIS — J9621 Acute and chronic respiratory failure with hypoxia: Secondary | ICD-10-CM | POA: Diagnosis not present

## 2020-02-13 DIAGNOSIS — N179 Acute kidney failure, unspecified: Secondary | ICD-10-CM | POA: Diagnosis not present

## 2020-02-13 DIAGNOSIS — I5042 Chronic combined systolic (congestive) and diastolic (congestive) heart failure: Secondary | ICD-10-CM

## 2020-02-13 DIAGNOSIS — U071 COVID-19: Secondary | ICD-10-CM | POA: Diagnosis not present

## 2020-02-13 DIAGNOSIS — N1832 Chronic kidney disease, stage 3b: Secondary | ICD-10-CM

## 2020-02-13 DIAGNOSIS — J1282 Pneumonia due to coronavirus disease 2019: Secondary | ICD-10-CM

## 2020-02-13 LAB — CBC
HCT: 46.3 % — ABNORMAL HIGH (ref 36.0–46.0)
Hemoglobin: 15.4 g/dL — ABNORMAL HIGH (ref 12.0–15.0)
MCH: 31.6 pg (ref 26.0–34.0)
MCHC: 33.3 g/dL (ref 30.0–36.0)
MCV: 95.1 fL (ref 80.0–100.0)
Platelets: 205 10*3/uL (ref 150–400)
RBC: 4.87 MIL/uL (ref 3.87–5.11)
RDW: 14.6 % (ref 11.5–15.5)
WBC: 21.2 10*3/uL — ABNORMAL HIGH (ref 4.0–10.5)
nRBC: 0 % (ref 0.0–0.2)

## 2020-02-13 LAB — BASIC METABOLIC PANEL
Anion gap: 14 (ref 5–15)
BUN: 86 mg/dL — ABNORMAL HIGH (ref 8–23)
CO2: 23 mmol/L (ref 22–32)
Calcium: 9.4 mg/dL (ref 8.9–10.3)
Chloride: 94 mmol/L — ABNORMAL LOW (ref 98–111)
Creatinine, Ser: 1.99 mg/dL — ABNORMAL HIGH (ref 0.44–1.00)
GFR calc Af Amer: 26 mL/min — ABNORMAL LOW (ref >60)
GFR calc non Af Amer: 23 mL/min — ABNORMAL LOW (ref >60)
Glucose, Bld: 47 mg/dL — ABNORMAL LOW (ref 70–99)
Potassium: 4.8 mmol/L (ref 3.5–5.1)
Sodium: 131 mmol/L — ABNORMAL LOW (ref 135–145)

## 2020-02-13 LAB — MAGNESIUM: Magnesium: 2.7 mg/dL — ABNORMAL HIGH (ref 1.7–2.4)

## 2020-02-13 LAB — URINALYSIS, ROUTINE W REFLEX MICROSCOPIC
Bilirubin Urine: NEGATIVE
Glucose, UA: NEGATIVE mg/dL
Hgb urine dipstick: NEGATIVE
Ketones, ur: NEGATIVE mg/dL
Leukocytes,Ua: NEGATIVE
Nitrite: NEGATIVE
Protein, ur: NEGATIVE mg/dL
Specific Gravity, Urine: 1.013 (ref 1.005–1.030)
pH: 5 (ref 5.0–8.0)

## 2020-02-13 NOTE — Progress Notes (Signed)
[facility] [place]  Date of Service: 02/13/2020  PULMONARY CRITICAL CARE CONSULT   [name]  MPN:[id number]  DOB: 05/02/1936   DOA: 02/04/2020  Referring Physician: Merton Border, MD  HPI: Sydney Phillips is a 84 y.o. female seen for follow up of Acute on Chronic Respiratory Failure.  Patient has multiple medical problems including chronic systolic heart failure ventricular tachycardia oxygen dependent COPD hypertension diabetes type 2 chronic kidney disease stage IIIb will be admitted to the hospital for COVID-19 from August 4 August 9.  At that time she was profoundly hypoxic and placed on nonrebreather.  Patient had noted to have progressive fibrotic changes on the chest x-rays.  Hospital course was complicated initially admitted to the hospital up until 9 August was discharged home on oxygen therapy however was readmitted to the ICU for severe hypoxia on 16 August.  Patient had an echocardiogram done which showed EF of 25 to 30% with grade 2 diastolic dysfunction.  Chest x-ray had shown severe bilateral infiltrates.  As far as the Covid is concerned patient had received remdesivir as well as steroids.  Patient also did receive antibiotics in the form of cefepime and vancomycin.  Review of Systems:  ROS performed and is unremarkable other than noted above.  Past Medical History:  Diagnosis Date  . Arthritis   . CAD (coronary artery disease)    a. s/p DES to LCx/RCA 05/2016, ostial LAD disease.  . Cardiomyopathy ([place])   . Chronic back pain   . Chronic combined systolic and diastolic CHF (congestive heart failure) ([place])   . CKD (chronic kidney disease), stage III   . Colon polyps 2003.  2015.   HP polyps 2003.  adnomas 2015.  required referal to [facility] for colonoscopic resection of flat polyps.   Marland Kitchen COPD (chronic obstructive pulmonary disease) ([place])   . Dementia ([place])   . Depression with anxiety    takes Cymbalta  daily  . Diabetes mellitus ([place])   . Early cataracts, bilateral   . Fibromyalgia   . GERD (gastroesophageal reflux disease)    was on meds but was taken off;now watches what she eats  . Hemorrhoids   . History of kidney stones   . HOH (hard of hearing)   . Hx of colonic polyps   . Hyperlipemia    takes Crestor daily  . Hypertension    takes Amlodipine and Metoprolol daily  . ICD (implantable cardioverter-defibrillator) discharge 01/12/2020  . Insomnia   . LBBB (left bundle branch block)    Stress test 09/03/2010, EF 55  . PAD (peripheral artery disease) (HCC)    Carotid, subclavian, and lower extremity beds, currently not symptomatic  . Presence of combination internal cardiac defibrillator (ICD) and pacemaker   . Pulmonary hypertension ([place])   . Subclavian arterial stenosis, lt, with PTA/STENT 07/31/11 08/01/2011  . Vertigo    takes Meclizine prn    Past Surgical History:  Procedure Laterality Date  . ABDOMINAL AORTAGRAM N/A 08/12/2013   Procedure: ABDOMINAL Maxcine Ham;  Surgeon: Elam Dutch, MD;  Location: [facility] CATH LAB;  Service: Cardiovascular;  Laterality: N/A;  . ABDOMINAL HYSTERECTOMY    . APPENDECTOMY    . BACK SURGERY  2012  . BIV ICD GENERTAOR CHANGE OUT Left 02/20/2012   Procedure: BIV ICD GENERTAOR CHANGE OUT;  Surgeon: [name], MD;  Location: [facility] CATH LAB;  Service: Cardiovascular;  Laterality: Left;  . CARDIAC CATHETERIZATION  12/01/2007   By  Dr. Melvern Banker, left heart cath,   . CARDIAC CATHETERIZATION N/A 05/20/2016   Procedure: Right/Left Heart Cath and Coronary Angiography;  Surgeon: Sherren Mocha, MD;  Location: [place] CV LAB;  Service: Cardiovascular;  Laterality: N/A;  . CARDIAC CATHETERIZATION N/A 05/20/2016   Procedure: Coronary Stent Intervention;  Surgeon: Sherren Mocha, MD;  Location: [place] CV LAB;  Service: Cardiovascular;  Laterality: N/A;  . CARDIAC DEFIBRILLATOR PLACEMENT  05/2008   By Dr Blanch Media, [employer] CANNOT HAVE MRI's  . CAROTID  ANGIOGRAM N/A 07/31/2011   Procedure: CAROTID ANGIOGRAM;  Surgeon: Lorretta Harp, MD;  Location: [facility] CATH LAB;  Service: Cardiovascular;  Laterality: N/A;  carotid angiogram and possible Lt SCA PTA  . COLONOSCOPY W/ POLYPECTOMY  12/2013  . CORONARY ANGIOPLASTY    . ENDARTERECTOMY Left 11/08/2014   Procedure: LEFT CAROTID ENDARTERECTOMY WITH HEMASHIELD PATCH ANGIOPLASTY;  Surgeon: Elam Dutch, MD;  Location: [place];  Service: Vascular;  Laterality: Left;  . ESOPHAGOGASTRODUODENOSCOPY N/A 01/20/2014   Procedure: ESOPHAGOGASTRODUODENOSCOPY (EGD);  Surgeon: Jerene Bears, MD;  Location: [facility] ENDOSCOPY;  Service: Endoscopy;  Laterality: N/A;  . FEMORAL-POPLITEAL BYPASS GRAFT Right 10/12/2013   Procedure:   Femoral-Peroneal trunk  bypass with nonreversed greater saphenous vein graft;  Surgeon: Elam Dutch, MD;  Location: [place];  Service: Vascular;  Laterality: Right;  . GIVENS CAPSULE STUDY N/A 01/20/2014   Procedure: GIVENS CAPSULE STUDY;  Surgeon: Jerene Bears, MD;  Location: [place];  Service: Gastroenterology;  Laterality: N/A;  . ICD GENERATOR CHANGEOUT N/A 03/04/2017   Procedure: ICD Generator Changeout;  Surgeon: [name], MD;  Location: [place] CV LAB;  Service: Cardiovascular;  Laterality: N/A;  . INSERT / REPLACE / REMOVE PACEMAKER    . INTRAOPERATIVE ARTERIOGRAM Right 10/12/2013   Procedure: INTRA OPERATIVE ARTERIOGRAM;  Surgeon: Elam Dutch, MD;  Location: [place];  Service: Vascular;  Laterality: Right;  . LEFT HEART CATH AND CORONARY ANGIOGRAPHY N/A 10/24/2019   Procedure: LEFT HEART CATH AND CORONARY ANGIOGRAPHY;  Surgeon: Belva Crome, MD;  Location: [place] CV LAB;  Service: Cardiovascular;  Laterality: N/A;  . ORIF ELBOW FRACTURE  08/16/2011   Procedure: OPEN REDUCTION INTERNAL FIXATION (ORIF) ELBOW/OLECRANON FRACTURE;  Surgeon: Schuyler Amor, MD;  Location: [place];  Service: Orthopedics;  Laterality: Left;  . RENAL ANGIOGRAM N/A 08/12/2013   Procedure: RENAL  ANGIOGRAM;  Surgeon: Elam Dutch, MD;  Location[facility] CATH LAB;  Service: Cardiovascular;  Laterality: N/A;  . SUBCLAVIAN STENT PLACEMENT Left 07/31/2011   7x18 Genesis, balloon, with reduction of 90% ostial left subclavian artery stenosis to 0% with residual excellent flow  . TONSILLECTOMY    . TUBAL LIGATION      Social History:    reports that she quit smoking about 2 years ago. Her smoking use included cigarettes. She has a 31.00 pack-year smoking history. She has never used smokeless tobacco. She reports that she does not drink alcohol and does not use drugs.  Family History: Non-Contributory to the present illness  Allergies  Allergen Reactions  . Potassium-Containing Compounds Other (See Comments)    Causes severe constipation  . Neomycin-Polymyxin B Gu     Swollen throat   . Other Other (See Comments)    "mycin" antibiotics cause sleeplessness and itching  . Azithromycin Rash  . Codeine Itching  . Darvon Itching  . Erythromycin Rash  . Meloxicam Other (See Comments)    Unknown    . Norco [Hydrocodone-Acetaminophen] Itching  . Penicillins Rash  and Other (See Comments)    Has patient had a PCN reaction causing immediate rash, facial/tongue/throat swelling, SOB or lightheadedness with hypotension: unknown Has patient had a PCN reaction causing severe rash involving mucus membranes or skin necrosis: no Has patient had a PCN reaction that required hospitalization: unknown Has patient had a PCN reaction occurring within the last 10 years: no If all of the above answers are "NO", then may proceed with Cephalosporin use.   Marland Kitchen Propoxyphene N-Acetaminophen Itching  . Rofecoxib Other (See Comments)    Unknown    . Rosuvastatin Other (See Comments)    cramps  . Statins Itching and Other (See Comments)    Sleeplessness Sleeplessness  . Sulfa Antibiotics Rash    Medications: Reviewed on Rounds  Physical Exam:  Vitals: Temperature is 95.5 pulse 86 respiratory rate 22  blood pressure is 135/76 saturations 97%  Ventilator Settings on BiPAP 12/6 FiO2 100%  . General: Comfortable at this time . Eyes: Grossly normal lids, irises & conjunctiva . ENT: grossly tongue is normal . Neck: no obvious mass . Cardiovascular: S1-S2 normal no gallop rub . Respiratory: No rhonchi no rales.  Distant breath sounds . Abdomen: Soft and nontender . Skin: no rash seen on limited exam . Musculoskeletal: not rigid . Psychiatric:unable to assess . Neurologic: no seizure no involuntary movements         Labs on Admission:  Basic Metabolic Panel: Recent Labs  Lab 02/08/20 0801 02/08/20 0801 02/09/20 0156 02/09/20 1050 02/09/2020 0256 02/11/20 0610 02/12/20 0425 02/13/20 0741  NA 130*   < >  --  128* 134* 138 132* 131*  K 5.0   < >  --  5.0 4.4 4.7 4.2 4.8  CL 89*   < >  --  91* 91* 95* 95* 94*  CO2 27   < >  --  19* 28 29 27 23   GLUCOSE 124*   < >  --  158* 127* 186* 102* 47*  BUN 117*   < >  --  115* 112* 96* 80* 86*  CREATININE 2.28*   < >  --  2.34* 2.40* 2.03* 1.82* 1.99*  CALCIUM 9.3   < >  --  9.2 9.7 9.9 9.3 9.4  MG 2.7*  --  2.6*  --  2.6* 2.7*  --  2.7*  PHOS  --   --   --   --   --  4.3  --   --    < > = values in this interval not displayed.    Recent Labs  Lab 01/20/2020 1800  PHART 7.521*  PCO2ART 33.8  PO2ART 57.4*  HCO3 27.8  O2SAT 92.0    Liver Function Tests: Recent Labs  Lab 02/07/20 0659 02/08/20 0801 02/09/20 1050 01/29/2020 0256 02/11/20 0610  AST 17 17 29 20 24   ALT 19 20 QUANTITY NOT SUFFICIENT, UNABLE TO PERFORM TEST 23 31  ALKPHOS 71 71 72 66 60  BILITOT 1.4* 1.1 QUANTITY NOT SUFFICIENT, UNABLE TO PERFORM TEST 1.1 0.9  PROT 6.4* 6.2* 5.5* 6.2* 6.1*  ALBUMIN 2.7* 2.6* 2.4* 2.7* 2.6*   No results for input(s): LIPASE, AMYLASE in the last 168 hours. No results for input(s): AMMONIA in the last 168 hours.  CBC: Recent Labs  Lab 02/08/20 0801 02/09/20 1050 01/16/2020 0256 02/11/20 0610 02/13/20 0741  WBC 17.3* 23.1*  18.1* 18.6* 21.2*  NEUTROABS  --   --   --  17.2*  --   HGB 16.3* 16.4* 15.3*  15.5* 15.4*  HCT 48.1* 48.4* 45.4 46.9* 46.3*  MCV 92.1 94.0 92.3 93.8 95.1  PLT 263 262 239 230 205    Cardiac Enzymes: No results for input(s): CKTOTAL, CKMB, CKMBINDEX, TROPONINI in the last 168 hours.  BNP (last 3 results) Recent Labs    10/22/19 0325 12/05/19 1259 01/30/20 1723  BNP 769.0* 1,385.6* 319.6*    ProBNP (last 3 results) No results for input(s): PROBNP in the last 8760 hours.   Radiological Exams on Admission: DG CHEST PORT 1 VIEW  Result Date: 02/11/2020 CLINICAL DATA:  Respiratory failure.  COVID-19 pneumonia. EXAM: PORTABLE CHEST 1 VIEW COMPARISON:  01/31/2020 FINDINGS: Left chest wall ICD is noted with leads in the right atrial appendage and right ventricle. Stable cardiomediastinal contours. No pleural effusion identified. Diffuse, coarsened reticular interstitial markings are again noted bilaterally. Peripheral opacities within the left midlung and both lung bases are again noted. When compared with 01/31/2020 there is been improved aeration to both upper lobes and the right midlung. IMPRESSION: Interval improvement in aeration of both upper lung zones and right midlung. Electronically Signed   By: Kerby Moors M.D.   On: 02/11/2020 08:30    Assessment/Plan Active Problems:   Acute on chronic respiratory failure with hypoxia (HCC)   Chronic heart failure with reduced ejection fraction and diastolic dysfunction (HCC)   Acute renal failure superimposed on stage 3b chronic kidney disease ([place])   COVID-19 virus infection   Pneumonia due to COVID-19 virus   1. Acute on chronic respiratory failure with hypoxia patient has had severe hypoxemic respiratory failure and this is secondary to progressive pulmonary fibrotic changes from the Covid infection.  The patient still has fibrotic changes although the last chest x-ray showed slight improvement.  We will continue with oxygen therapy  and titrate as tolerated.  Patient has been a DNR and is not to be intubated.  She however can be considered for BiPAP. 2. Heart failure decreased ejection fraction patient had an EF of 20 to 25% obviously poor prognosis along with the setting of COVID-19 pulmonary fibrotic changes. 3. Acute renal failure on chronic stage IIIb renal failure we will continue to monitor labs closely.  Need to be careful as far as diuresis for the heart failure.  Overall prognosis is quite poor 4. COVID-19 virus infection patient had severe hypoxic respiratory failure as a result still in recovery slight improvement but overall has severe pulmonary damage from the infection itself. 5. COVID-19 pneumonia treated we will continue with supportive care patient has received remdesivir as well as Decadron as well as antibiotics  I have personally seen and evaluated the patient, evaluated laboratory and imaging results, formulated the assessment and plan and placed orders. The Patient requires high complexity decision making with multiple systems involvement.  Case was discussed on Rounds with the Respiratory Therapy Director and the Respiratory staff Time Spent 71minutes  Homer Miller A Derrisha Foos, MD [facility] Pulmonary Critical Care Medicine Sleep Medicine

## 2020-02-13 NOTE — Patient Outreach (Signed)
[facility]) Care Management  02/13/2020  Sydney Phillips Nov 06, 1935 [id number]    Message received from Natividad Brood that patient was transferred to long term care.  PLAN:  Case closed.  Tomasa Rand, RN, BSN, CEN [facility] [employer] [phone]

## 2020-02-14 ENCOUNTER — Other Ambulatory Visit (HOSPITAL_COMMUNITY): Payer: Self-pay

## 2020-02-14 DIAGNOSIS — I5042 Chronic combined systolic (congestive) and diastolic (congestive) heart failure: Secondary | ICD-10-CM | POA: Diagnosis not present

## 2020-02-14 DIAGNOSIS — U071 COVID-19: Secondary | ICD-10-CM | POA: Diagnosis not present

## 2020-02-14 DIAGNOSIS — N179 Acute kidney failure, unspecified: Secondary | ICD-10-CM | POA: Diagnosis not present

## 2020-02-14 DIAGNOSIS — J9621 Acute and chronic respiratory failure with hypoxia: Secondary | ICD-10-CM | POA: Diagnosis not present

## 2020-02-14 LAB — CBC
HCT: 45.7 % (ref 36.0–46.0)
Hemoglobin: 15.5 g/dL — ABNORMAL HIGH (ref 12.0–15.0)
MCH: 32 pg (ref 26.0–34.0)
MCHC: 33.9 g/dL (ref 30.0–36.0)
MCV: 94.2 fL (ref 80.0–100.0)
Platelets: 181 10*3/uL (ref 150–400)
RBC: 4.85 MIL/uL (ref 3.87–5.11)
RDW: 14.6 % (ref 11.5–15.5)
WBC: 18.7 10*3/uL — ABNORMAL HIGH (ref 4.0–10.5)
nRBC: 0 % (ref 0.0–0.2)

## 2020-02-14 LAB — URINE CULTURE: Culture: NO GROWTH

## 2020-02-14 LAB — RENAL FUNCTION PANEL
Albumin: 2.6 g/dL — ABNORMAL LOW (ref 3.5–5.0)
Anion gap: 17 — ABNORMAL HIGH (ref 5–15)
BUN: 82 mg/dL — ABNORMAL HIGH (ref 8–23)
CO2: 24 mmol/L (ref 22–32)
Calcium: 9.1 mg/dL (ref 8.9–10.3)
Chloride: 94 mmol/L — ABNORMAL LOW (ref 98–111)
Creatinine, Ser: 1.84 mg/dL — ABNORMAL HIGH (ref 0.44–1.00)
GFR calc Af Amer: 29 mL/min — ABNORMAL LOW (ref >60)
GFR calc non Af Amer: 25 mL/min — ABNORMAL LOW (ref >60)
Glucose, Bld: 81 mg/dL (ref 70–99)
Phosphorus: 4.6 mg/dL (ref 2.5–4.6)
Potassium: 4.2 mmol/L (ref 3.5–5.1)
Sodium: 135 mmol/L (ref 135–145)

## 2020-02-14 LAB — MAGNESIUM: Magnesium: 2.6 mg/dL — ABNORMAL HIGH (ref 1.7–2.4)

## 2020-02-14 NOTE — Progress Notes (Addendum)
Pulmonary Critical Care Medicine [place]   PULMONARY CRITICAL CARE SERVICE  PROGRESS NOTE  Date of Service: 02/14/2020  Sydney Phillips  CXK:[id number]  DOB: 03/31/1936   DOA: 01/17/2020  Referring Physician: Merton Border, MD  HPI: Sydney Phillips is a 84 y.o. female seen for follow up of Acute on Chronic Respiratory Failure.  Patient remains on heated high flow nasal cannula 40 L 100% FiO2 during the day uses BiPAP at night currently satting well no distress.  Medications: Reviewed on Rounds  Physical Exam:  Vitals: Pulse 95 respirations 18 BP 109/52 O2 sat 100% temp 96.1  Ventilator Settings heated high flow 40 L 100% FiO2  . General: Comfortable at this time . Eyes: Grossly normal lids, irises & conjunctiva . ENT: grossly tongue is normal . Neck: no obvious mass . Cardiovascular: S1 S2 normal no gallop . Respiratory: Coarse breath sounds . Abdomen: soft . Skin: no rash seen on limited exam . Musculoskeletal: not rigid . Psychiatric:unable to assess . Neurologic: no seizure no involuntary movements         Lab Data:   Basic Metabolic Panel: Recent Labs  Lab 02/09/20 0156 02/09/20 1050 01/27/2020 0256 02/11/20 0610 02/12/20 0425 02/13/20 0741 02/14/20 0803  NA  --    < > 134* 138 132* 131* 135  K  --    < > 4.4 4.7 4.2 4.8 4.2  CL  --    < > 91* 95* 95* 94* 94*  CO2  --    < > 28 29 27 23 24   GLUCOSE  --    < > 127* 186* 102* 47* 81  BUN  --    < > 112* 96* 80* 86* 82*  CREATININE  --    < > 2.40* 2.03* 1.82* 1.99* 1.84*  CALCIUM  --    < > 9.7 9.9 9.3 9.4 9.1  MG 2.6*  --  2.6* 2.7*  --  2.7* 2.6*  PHOS  --   --   --  4.3  --   --  4.6   < > = values in this interval not displayed.    ABG: Recent Labs  Lab 02/06/2020 1800  PHART 7.521*  PCO2ART 33.8  PO2ART 57.4*  HCO3 27.8  O2SAT 92.0    Liver Function Tests: Recent Labs  Lab 02/08/20 0801 02/09/20 1050 01/15/2020 0256 02/11/20 0610 02/14/20 0803  AST 17 29 20 24    --   ALT 20 QUANTITY NOT SUFFICIENT, UNABLE TO PERFORM TEST 23 31  --   ALKPHOS 71 72 66 60  --   BILITOT 1.1 QUANTITY NOT SUFFICIENT, UNABLE TO PERFORM TEST 1.1 0.9  --   PROT 6.2* 5.5* 6.2* 6.1*  --   ALBUMIN 2.6* 2.4* 2.7* 2.6* 2.6*   No results for input(s): LIPASE, AMYLASE in the last 168 hours. No results for input(s): AMMONIA in the last 168 hours.  CBC: Recent Labs  Lab 02/09/20 1050 01/18/2020 0256 02/11/20 0610 02/13/20 0741 02/14/20 0819  WBC 23.1* 18.1* 18.6* 21.2* 18.7*  NEUTROABS  --   --  17.2*  --   --   HGB 16.4* 15.3* 15.5* 15.4* 15.5*  HCT 48.4* 45.4 46.9* 46.3* 45.7  MCV 94.0 92.3 93.8 95.1 94.2  PLT 262 239 230 205 181    Cardiac Enzymes: No results for input(s): CKTOTAL, CKMB, CKMBINDEX, TROPONINI in the last 168 hours.  BNP (last 3 results) Recent Labs    10/22/19 0325 12/05/19 1259  01/30/20 1723  BNP 769.0* 1,385.6* 319.6*    ProBNP (last 3 results) No results for input(s): PROBNP in the last 8760 hours.  Radiological Exams: DG Wrist 2 Views Left  Result Date: 02/13/2020 CLINICAL DATA:  Concern for fracture. EXAM: LEFT WRIST - 2 VIEW COMPARISON:  01/12/2020 FINDINGS: There is no evidence of fracture or dislocation. Similar osteoarthritis, most pronounced at the first [facility] and triscaphe joints. Mild widening of the scapholunate interval was better characterized on prior. Soft tissues are unremarkable. Osteopenia. IMPRESSION: 1. No evidence of acute or healing fracture. 2. Mild widening of the scapholunate interval was better characterized on prior. Electronically Signed   By: Margaretha Sheffield MD   On: 02/13/2020 15:03   DG CHEST PORT 1 VIEW  Result Date: 02/14/2020 CLINICAL DATA:  Pneumonia. EXAM: PORTABLE CHEST 1 VIEW COMPARISON:  02/11/2020. FINDINGS: AICD in stable position. Stable cardiomegaly. Diffuse bilateral interstitial infiltrates again noted without interim change. Small bilateral pleural effusions cannot be excluded. No pneumothorax.  IMPRESSION: 1.  AICD in stable position.  Stable cardiomegaly. 2. Diffuse bilateral interstitial infiltrates again noted without interim change. Small bilateral pleural effusions cannot be excluded. Electronically Signed   By: Marcello Moores  Register   On: 02/14/2020 07:12    Assessment/Plan Active Problems:   Acute on chronic respiratory failure with hypoxia (HCC)   Chronic heart failure with reduced ejection fraction and diastolic dysfunction (HCC)   Acute renal failure superimposed on stage 3b chronic kidney disease ([place])   COVID-19 virus infection   Pneumonia due to COVID-19 virus   1. Acute on chronic respiratory failure with hypoxia patient has had severe hypoxemic respiratory failure and this is secondary to progressive pulmonary fibrotic changes.  She remains on HHF with 100% fio2.   2. Heart failure decreased ejection fraction patient had an EF of 20 to 25% prognosis remains poor. 3. Acute renal failure on chronic stage IIIb renal failure we will continue to monitor labs closely.  4. COVID-19 virus infection patient had severe hypoxic respiratory failure as a result still in recovery slight improvement but overall has severe pulmonary damage from the infection itself. 5. COVID-19 pneumonia treated we will continue with supportive care patient has received remdesivir as well as Decadron as well as antibiotics   I have personally seen and evaluated the patient, evaluated laboratory and imaging results, formulated the assessment and plan and placed orders. The Patient requires high complexity decision making with multiple systems involvement.  Rounds were done with the Respiratory Therapy Director and Staff therapists and discussed with nursing staff also.  Allyne Gee, MD [facility] Pulmonary Critical Care Medicine Sleep Medicine

## 2020-02-15 ENCOUNTER — Encounter: Payer: Self-pay | Admitting: Internal Medicine

## 2020-02-15 DIAGNOSIS — N179 Acute kidney failure, unspecified: Secondary | ICD-10-CM | POA: Diagnosis not present

## 2020-02-15 DIAGNOSIS — U071 COVID-19: Secondary | ICD-10-CM | POA: Diagnosis not present

## 2020-02-15 DIAGNOSIS — I5042 Chronic combined systolic (congestive) and diastolic (congestive) heart failure: Secondary | ICD-10-CM | POA: Diagnosis not present

## 2020-02-15 DIAGNOSIS — J9621 Acute and chronic respiratory failure with hypoxia: Secondary | ICD-10-CM | POA: Diagnosis not present

## 2020-02-15 DIAGNOSIS — J1282 Pneumonia due to coronavirus disease 2019: Secondary | ICD-10-CM | POA: Diagnosis present

## 2020-02-15 NOTE — Progress Notes (Addendum)
Pulmonary Critical Care Medicine [place]   PULMONARY CRITICAL CARE SERVICE  PROGRESS NOTE  Date of Service: 02/15/2020  Sydney Phillips  ERD:[id number]  DOB: [birth year]   DOA: 01/27/2020  Referring Physician: Merton Border, MD  HPI: Sydney Phillips is a 84 y.o. female seen for follow up of Acute on Chronic Respiratory Failure.  Patient remains DNR at this time on heated high flow nasal cannula 40 L 100% FiO2 is refusing BiPAP patient is also wearing nonrebreather with heated high flow prognosis remains poor.  Medications: Reviewed on Rounds  Physical Exam:  Vitals: Pulse 99 respirations 22 BP 126/30 O2 sat 97% temp 96.5  Ventilator Settings heated high flow 40 L 100% FiO2 + nonrebreather 15 L  . General: Comfortable at this time . Eyes: Grossly normal lids, irises & conjunctiva . ENT: grossly tongue is normal . Neck: no obvious mass . Cardiovascular: S1 S2 normal no gallop . Respiratory: Coarse breath sounds . Abdomen: soft . Skin: no rash seen on limited exam . Musculoskeletal: not rigid . Psychiatric:unable to assess . Neurologic: no seizure no involuntary movements         Lab Data:   Basic Metabolic Panel: Recent Labs  Lab 02/09/20 0156 02/09/20 1050 02/05/2020 0256 02/11/20 0610 02/12/20 0425 02/13/20 0741 02/14/20 0803  NA  --    < > 134* 138 132* 131* 135  K  --    < > 4.4 4.7 4.2 4.8 4.2  CL  --    < > 91* 95* 95* 94* 94*  CO2  --    < > 28 29 27 23 24   GLUCOSE  --    < > 127* 186* 102* 47* 81  BUN  --    < > 112* 96* 80* 86* 82*  CREATININE  --    < > 2.40* 2.03* 1.82* 1.99* 1.84*  CALCIUM  --    < > 9.7 9.9 9.3 9.4 9.1  MG 2.6*  --  2.6* 2.7*  --  2.7* 2.6*  PHOS  --   --   --  4.3  --   --  4.6   < > = values in this interval not displayed.    ABG: Recent Labs  Lab 01/24/2020 1800  PHART 7.521*  PCO2ART 33.8  PO2ART 57.4*  HCO3 27.8  O2SAT 92.0    Liver Function Tests: Recent Labs  Lab 02/09/20 1050  01/19/2020 0256 02/11/20 0610 02/14/20 0803  AST 29 20 24   --   ALT QUANTITY NOT SUFFICIENT, UNABLE TO PERFORM TEST 23 31  --   ALKPHOS 72 66 60  --   BILITOT QUANTITY NOT SUFFICIENT, UNABLE TO PERFORM TEST 1.1 0.9  --   PROT 5.5* 6.2* 6.1*  --   ALBUMIN 2.4* 2.7* 2.6* 2.6*   No results for input(s): LIPASE, AMYLASE in the last 168 hours. No results for input(s): AMMONIA in the last 168 hours.  CBC: Recent Labs  Lab 02/09/20 1050 02/04/2020 0256 02/11/20 0610 02/13/20 0741 02/14/20 0819  WBC 23.1* 18.1* 18.6* 21.2* 18.7*  NEUTROABS  --   --  17.2*  --   --   HGB 16.4* 15.3* 15.5* 15.4* 15.5*  HCT 48.4* 45.4 46.9* 46.3* 45.7  MCV 94.0 92.3 93.8 95.1 94.2  PLT 262 239 230 205 181    Cardiac Enzymes: No results for input(s): CKTOTAL, CKMB, CKMBINDEX, TROPONINI in the last 168 hours.  BNP (last 3 results) Recent Labs    10/22/19  0211 12/05/19 1259 01/30/20 1723  BNP 769.0* 1,385.6* 319.6*    ProBNP (last 3 results) No results for input(s): PROBNP in the last 8760 hours.  Radiological Exams: DG CHEST PORT 1 VIEW  Result Date: 02/14/2020 CLINICAL DATA:  Pneumonia. EXAM: PORTABLE CHEST 1 VIEW COMPARISON:  02/11/2020. FINDINGS: AICD in stable position. Stable cardiomegaly. Diffuse bilateral interstitial infiltrates again noted without interim change. Small bilateral pleural effusions cannot be excluded. No pneumothorax. IMPRESSION: 1.  AICD in stable position.  Stable cardiomegaly. 2. Diffuse bilateral interstitial infiltrates again noted without interim change. Small bilateral pleural effusions cannot be excluded. Electronically Signed   By: Marcello Moores  Register   On: 02/14/2020 07:12    Assessment/Plan Active Problems:   Acute on chronic respiratory failure with hypoxia (HCC)   Chronic heart failure with reduced ejection fraction and diastolic dysfunction (HCC)   Acute renal failure superimposed on stage 3b chronic kidney disease ([place])   COVID-19 virus infection   Pneumonia  due to COVID-19 virus   1. Acute on chronic respiratory failure with hypoxia patient has had severe hypoxemic respiratory failure and this is secondary to progressive pulmonary fibrotic changes.  She remains on HHF with 100% fio2.   2. Heart failure decreased ejection fraction patient had an EF of 20 to 25% prognosis remains poor. 3. Acute renal failure on chronic stage IIIb renal failure we will continue to monitor labs closely.  4. COVID-19 virus infection patient had severe hypoxic respiratory failure as a result still in recovery slight improvement but overall has severe pulmonary damage from the infection itself. 5. COVID-19 pneumonia treated we will continue with supportive care patient has received remdesivir as well as Decadron as well as antibiotics   I have personally seen and evaluated the patient, evaluated laboratory and imaging results, formulated the assessment and plan and placed orders. The Patient requires high complexity decision making with multiple systems involvement.  Rounds were done with the Respiratory Therapy Director and Staff therapists and discussed with nursing staff also.  Allyne Gee, MD [facility] Pulmonary Critical Care Medicine Sleep Medicine

## 2020-02-15 DEATH — deceased

## 2020-02-16 DIAGNOSIS — N179 Acute kidney failure, unspecified: Secondary | ICD-10-CM | POA: Diagnosis not present

## 2020-02-16 DIAGNOSIS — I5042 Chronic combined systolic (congestive) and diastolic (congestive) heart failure: Secondary | ICD-10-CM | POA: Diagnosis not present

## 2020-02-16 DIAGNOSIS — J9621 Acute and chronic respiratory failure with hypoxia: Secondary | ICD-10-CM | POA: Diagnosis not present

## 2020-02-16 DIAGNOSIS — U071 COVID-19: Secondary | ICD-10-CM | POA: Diagnosis not present

## 2020-02-16 LAB — CBC
HCT: 42.2 % (ref 36.0–46.0)
Hemoglobin: 14 g/dL (ref 12.0–15.0)
MCH: 32 pg (ref 26.0–34.0)
MCHC: 33.2 g/dL (ref 30.0–36.0)
MCV: 96.6 fL (ref 80.0–100.0)
Platelets: 199 10*3/uL (ref 150–400)
RBC: 4.37 MIL/uL (ref 3.87–5.11)
RDW: 15.4 % (ref 11.5–15.5)
WBC: 17.5 10*3/uL — ABNORMAL HIGH (ref 4.0–10.5)
nRBC: 0.2 % (ref 0.0–0.2)

## 2020-02-16 LAB — RENAL FUNCTION PANEL
Albumin: 2.5 g/dL — ABNORMAL LOW (ref 3.5–5.0)
Anion gap: 13 (ref 5–15)
BUN: 72 mg/dL — ABNORMAL HIGH (ref 8–23)
CO2: 25 mmol/L (ref 22–32)
Calcium: 9.2 mg/dL (ref 8.9–10.3)
Chloride: 102 mmol/L (ref 98–111)
Creatinine, Ser: 2.01 mg/dL — ABNORMAL HIGH (ref 0.44–1.00)
GFR calc Af Amer: 26 mL/min — ABNORMAL LOW (ref >60)
GFR calc non Af Amer: 23 mL/min — ABNORMAL LOW (ref >60)
Glucose, Bld: 74 mg/dL (ref 70–99)
Phosphorus: 4.5 mg/dL (ref 2.5–4.6)
Potassium: 4.8 mmol/L (ref 3.5–5.1)
Sodium: 140 mmol/L (ref 135–145)

## 2020-02-16 LAB — MAGNESIUM: Magnesium: 2.6 mg/dL — ABNORMAL HIGH (ref 1.7–2.4)

## 2020-02-16 NOTE — Progress Notes (Addendum)
Pulmonary Critical Care Medicine [place]   PULMONARY CRITICAL CARE SERVICE  PROGRESS NOTE  Date of Service: 02/16/2020  Sydney Phillips  JKK:[id number]  DOB: 26-Dec-1935   DOA: 02/04/2020  Referring Physician: Merton Border, MD  HPI: Sydney Phillips is a 84 y.o. female seen for follow up of Acute on Chronic Respiratory Failure. Patien continues ot wean at this time.  Currently down to Heated high flow 85%.  No distress noted.   Medications: Reviewed on Rounds  Physical Exam:  Vitals: Pulse 102 respirations 30 BP 101/79 O2 sat 96% temp 96.8  Ventilator Settings heated high flow nasal cannula 35 L 85% FiO2  . General: Comfortable at this time . Eyes: Grossly normal lids, irises & conjunctiva . ENT: grossly tongue is normal . Neck: no obvious mass . Cardiovascular: S1 S2 normal no gallop . Respiratory: Coarse breath sounds . Abdomen: soft . Skin: no rash seen on limited exam . Musculoskeletal: not rigid . Psychiatric:unable to assess . Neurologic: no seizure no involuntary movements         Lab Data:   Basic Metabolic Panel: Recent Labs  Lab 01/17/2020 0256 01/28/2020 0256 02/11/20 0610 02/12/20 0425 02/13/20 0741 02/14/20 0803 02/16/20 0549  NA 134*   < > 138 132* 131* 135 140  K 4.4   < > 4.7 4.2 4.8 4.2 4.8  CL 91*   < > 95* 95* 94* 94* 102  CO2 28   < > 29 27 23 24 25   GLUCOSE 127*   < > 186* 102* 47* 81 74  BUN 112*   < > 96* 80* 86* 82* 72*  CREATININE 2.40*   < > 2.03* 1.82* 1.99* 1.84* 2.01*  CALCIUM 9.7   < > 9.9 9.3 9.4 9.1 9.2  MG 2.6*  --  2.7*  --  2.7* 2.6* 2.6*  PHOS  --   --  4.3  --   --  4.6 4.5   < > = values in this interval not displayed.    ABG: Recent Labs  Lab 01/31/2020 1800  PHART 7.521*  PCO2ART 33.8  PO2ART 57.4*  HCO3 27.8  O2SAT 92.0    Liver Function Tests: Recent Labs  Lab 01/19/2020 0256 02/11/20 0610 02/14/20 0803 02/16/20 0549  AST 20 24  --   --   ALT 23 31  --   --   ALKPHOS 66 60  --   --    BILITOT 1.1 0.9  --   --   PROT 6.2* 6.1*  --   --   ALBUMIN 2.7* 2.6* 2.6* 2.5*   No results for input(s): LIPASE, AMYLASE in the last 168 hours. No results for input(s): AMMONIA in the last 168 hours.  CBC: Recent Labs  Lab 01/20/2020 0256 02/11/20 0610 02/13/20 0741 02/14/20 0819 02/16/20 0549  WBC 18.1* 18.6* 21.2* 18.7* 17.5*  NEUTROABS  --  17.2*  --   --   --   HGB 15.3* 15.5* 15.4* 15.5* 14.0  HCT 45.4 46.9* 46.3* 45.7 42.2  MCV 92.3 93.8 95.1 94.2 96.6  PLT 239 230 205 181 199    Cardiac Enzymes: No results for input(s): CKTOTAL, CKMB, CKMBINDEX, TROPONINI in the last 168 hours.  BNP (last 3 results) Recent Labs    10/22/19 0325 12/05/19 1259 01/30/20 1723  BNP 769.0* 1,385.6* 319.6*    ProBNP (last 3 results) No results for input(s): PROBNP in the last 8760 hours.  Radiological Exams: No results found.  Assessment/Plan Active Problems:   Acute on chronic respiratory failure with hypoxia (HCC)   Chronic heart failure with reduced ejection fraction and diastolic dysfunction (HCC)   Acute renal failure superimposed on stage 3b chronic kidney disease ([place])   COVID-19 virus infection   Pneumonia due to COVID-19 virus   1. Acute on chronic respiratory failure with hypoxia patient has had severe hypoxemic respiratory failure and this is secondary to progressive pulmonary fibrotic changes. She continues to wean on heated high flow currently on 85% FiO2 continue supportive measures 2. Heart failure decreased ejection fraction patient had an EF of 20 to 25%prognosis remains poor. 3. Acute renal failure on chronic stage IIIb renal failure we will continue to monitor labs closely.  4. COVID-19 virus infection patient had severe hypoxic respiratory failure as a result still in recovery slight improvement but overall has severe pulmonary damage from the infection itself. 5. COVID-19 pneumonia treated we will continue with supportive care patient has received  remdesivir as well as Decadron as well as antibiotics   I have personally seen and evaluated the patient, evaluated laboratory and imaging results, formulated the assessment and plan and placed orders. The Patient requires high complexity decision making with multiple systems involvement.  Rounds were done with the Respiratory Therapy Director and Staff therapists and discussed with nursing staff also.  Allyne Gee, MD [facility] Pulmonary Critical Care Medicine Sleep Medicine

## 2020-02-17 LAB — RENAL FUNCTION PANEL
Albumin: 2.6 g/dL — ABNORMAL LOW (ref 3.5–5.0)
Anion gap: 16 — ABNORMAL HIGH (ref 5–15)
BUN: 55 mg/dL — ABNORMAL HIGH (ref 8–23)
CO2: 22 mmol/L (ref 22–32)
Calcium: 9 mg/dL (ref 8.9–10.3)
Chloride: 107 mmol/L (ref 98–111)
Creatinine, Ser: 1.87 mg/dL — ABNORMAL HIGH (ref 0.44–1.00)
GFR calc Af Amer: 29 mL/min — ABNORMAL LOW (ref >60)
GFR calc non Af Amer: 25 mL/min — ABNORMAL LOW (ref >60)
Glucose, Bld: 199 mg/dL — ABNORMAL HIGH (ref 70–99)
Phosphorus: 3.8 mg/dL (ref 2.5–4.6)
Potassium: 4.9 mmol/L (ref 3.5–5.1)
Sodium: 145 mmol/L (ref 135–145)

## 2020-02-17 LAB — CBC
HCT: 45.9 % (ref 36.0–46.0)
Hemoglobin: 14.9 g/dL (ref 12.0–15.0)
MCH: 32 pg (ref 26.0–34.0)
MCHC: 32.5 g/dL (ref 30.0–36.0)
MCV: 98.5 fL (ref 80.0–100.0)
Platelets: 210 10*3/uL (ref 150–400)
RBC: 4.66 MIL/uL (ref 3.87–5.11)
RDW: 15.9 % — ABNORMAL HIGH (ref 11.5–15.5)
WBC: 17.1 10*3/uL — ABNORMAL HIGH (ref 4.0–10.5)
nRBC: 0.2 % (ref 0.0–0.2)

## 2020-02-17 LAB — MAGNESIUM: Magnesium: 2.7 mg/dL — ABNORMAL HIGH (ref 1.7–2.4)

## 2020-02-17 NOTE — Progress Notes (Addendum)
Pulmonary Critical Care Medicine [place]   PULMONARY CRITICAL CARE SERVICE  PROGRESS NOTE  Date of Service: 02/17/2020  DEYNA CARBON  KPT:[id number]  DOB: [birth year]   DOA: 01/18/2020  Referring Physician: Merton Border, MD  HPI: RICKIE GUTIERRES is a 84 y.o. female seen for follow up of Acute on Chronic Respiratory Failure. Pt is currently on heather high flow nasal canula at 40 liters and requiring 95% FIO2.    Medications: Reviewed on Rounds  Physical Exam:  Vitals: Pulse 112, resp38, b 106/66, o2 sat 94%, temp 97.6  Ventilator Settings HHF 40l, 95% fio2  . General: Comfortable at this time . Eyes: Grossly normal lids, irises & conjunctiva . ENT: grossly tongue is normal . Neck: no obvious mass . Cardiovascular: S1 S2 normal no gallop . Respiratory: course breath sounds. . Abdomen: soft . Skin: no rash seen on limited exam . Musculoskeletal: not rigid . Psychiatric:unable to assess . Neurologic: no seizure no involuntary movements         Lab Data:   Basic Metabolic Panel: Recent Labs  Lab 02/11/20 0610 02/11/20 0610 02/12/20 0425 02/13/20 0741 02/14/20 0803 02/16/20 0549 02/17/20 1510  NA 138   < > 132* 131* 135 140 145  K 4.7   < > 4.2 4.8 4.2 4.8 4.9  CL 95*   < > 95* 94* 94* 102 107  CO2 29   < > 27 23 24 25 22   GLUCOSE 186*   < > 102* 47* 81 74 199*  BUN 96*   < > 80* 86* 82* 72* 55*  CREATININE 2.03*   < > 1.82* 1.99* 1.84* 2.01* 1.87*  CALCIUM 9.9   < > 9.3 9.4 9.1 9.2 9.0  MG 2.7*  --   --  2.7* 2.6* 2.6* 2.7*  PHOS 4.3  --   --   --  4.6 4.5 3.8   < > = values in this interval not displayed.    ABG: No results for input(s): PHART, PCO2ART, PO2ART, HCO3, O2SAT in the last 168 hours.  Liver Function Tests: Recent Labs  Lab 02/11/20 0610 02/14/20 0803 02/16/20 0549 02/17/20 1510  AST 24  --   --   --   ALT 31  --   --   --   ALKPHOS 60  --   --   --   BILITOT 0.9  --   --   --   PROT 6.1*  --   --   --    ALBUMIN 2.6* 2.6* 2.5* 2.6*   No results for input(s): LIPASE, AMYLASE in the last 168 hours. No results for input(s): AMMONIA in the last 168 hours.  CBC: Recent Labs  Lab 02/11/20 0610 02/13/20 0741 02/14/20 0819 02/16/20 0549 02/17/20 1100  WBC 18.6* 21.2* 18.7* 17.5* 17.1*  NEUTROABS 17.2*  --   --   --   --   HGB 15.5* 15.4* 15.5* 14.0 14.9  HCT 46.9* 46.3* 45.7 42.2 45.9  MCV 93.8 95.1 94.2 96.6 98.5  PLT 230 205 181 199 210    Cardiac Enzymes: No results for input(s): CKTOTAL, CKMB, CKMBINDEX, TROPONINI in the last 168 hours.  BNP (last 3 results) Recent Labs    10/22/19 0325 12/05/19 1259 01/30/20 1723  BNP 769.0* 1,385.6* 319.6*    ProBNP (last 3 results) No results for input(s): PROBNP in the last 8760 hours.  Radiological Exams: No results found.  Assessment/Plan Active Problems:   Acute on chronic respiratory  failure with hypoxia (HCC)   Chronic heart failure with reduced ejection fraction and diastolic dysfunction (HCC)   Acute renal failure superimposed on stage 3b chronic kidney disease ([place])   COVID-19 virus infection   Pneumonia due to COVID-19 virus   1. Acute on chronic respiratory failure with hypoxia patient has had severe hypoxemic respiratory failure and this is secondary to progressive pulmonary fibrotic changes. She continues to requrire an fio2 of 95%.  She remains on heated high flow.   2. Heart failure decreased ejection fraction patient had an EF of 20 to 25% prognosis remains poor. 3. Acute renal failure on chronic stage IIIb renal failure we will continue to monitor labs closely.  4. COVID-19 virus infection patient had severe hypoxic respiratory failure as a result still in recovery slight improvement but overall has severe pulmonary damage from the infection itself. 5. COVID-19 pneumonia treated we will continue with supportive care patient has received remdesivir as well as Decadron as well as antibiotics   I have personally  seen and evaluated the patient, evaluated laboratory and imaging results, formulated the assessment and plan and placed orders. The Patient requires high complexity decision making with multiple systems involvement.  Rounds were done with the Respiratory Therapy Director and Staff therapists and discussed with nursing staff also.  Allyne Gee, MD [facility] Pulmonary Critical Care Medicine Sleep Medicine

## 2020-03-16 DEATH — deceased

## 2020-03-26 ENCOUNTER — Encounter: Payer: Medicare Other | Admitting: Cardiovascular Disease
